# Patient Record
Sex: Male | Born: 1955
Health system: Southern US, Community
[De-identification: ages and names within clinical notes are randomized; demographics above are authoritative.]

## PROBLEM LIST (undated history)

## (undated) DIAGNOSIS — C801 Malignant (primary) neoplasm, unspecified: Secondary | ICD-10-CM

## (undated) DIAGNOSIS — M1990 Unspecified osteoarthritis, unspecified site: Secondary | ICD-10-CM

## (undated) DIAGNOSIS — S14105A Unspecified injury at C5 level of cervical spinal cord, initial encounter: Secondary | ICD-10-CM

## (undated) DIAGNOSIS — M419 Scoliosis, unspecified: Secondary | ICD-10-CM

## (undated) DIAGNOSIS — E785 Hyperlipidemia, unspecified: Secondary | ICD-10-CM

## (undated) DIAGNOSIS — F329 Major depressive disorder, single episode, unspecified: Secondary | ICD-10-CM

## (undated) DIAGNOSIS — E162 Hypoglycemia, unspecified: Secondary | ICD-10-CM

## (undated) DIAGNOSIS — F32A Depression, unspecified: Secondary | ICD-10-CM

## (undated) DIAGNOSIS — G709 Myoneural disorder, unspecified: Secondary | ICD-10-CM

## (undated) DIAGNOSIS — J40 Bronchitis, not specified as acute or chronic: Secondary | ICD-10-CM

## (undated) DIAGNOSIS — K219 Gastro-esophageal reflux disease without esophagitis: Secondary | ICD-10-CM

## (undated) DIAGNOSIS — E119 Type 2 diabetes mellitus without complications: Secondary | ICD-10-CM

## (undated) DIAGNOSIS — Z8719 Personal history of other diseases of the digestive system: Secondary | ICD-10-CM

## (undated) DIAGNOSIS — M542 Cervicalgia: Secondary | ICD-10-CM

## (undated) DIAGNOSIS — I10 Essential (primary) hypertension: Secondary | ICD-10-CM

## (undated) DIAGNOSIS — M25649 Stiffness of unspecified hand, not elsewhere classified: Secondary | ICD-10-CM

## (undated) HISTORY — PX: OTHER SURGICAL HISTORY: SHX169

## (undated) HISTORY — PX: UPPER GASTROINTESTINAL ENDOSCOPY: SHX188

## (undated) HISTORY — DX: Gastro-esophageal reflux disease without esophagitis: K21.9

## (undated) HISTORY — DX: Unspecified osteoarthritis, unspecified site: M19.90

## (undated) HISTORY — PX: TONSILLECTOMY: SUR1361

## (undated) HISTORY — DX: Hyperlipidemia, unspecified: E78.5

## (undated) HISTORY — DX: Essential (primary) hypertension: I10

## (undated) HISTORY — DX: Myoneural disorder, unspecified: G70.9

## (undated) HISTORY — PX: SPINE SURGERY: SHX786

## (undated) HISTORY — DX: Unspecified injury at C5 level of cervical spinal cord, initial encounter: S14.105A

## (undated) HISTORY — DX: Type 2 diabetes mellitus without complications: E11.9

---

## 1975-08-01 DIAGNOSIS — J40 Bronchitis, not specified as acute or chronic: Secondary | ICD-10-CM

## 1975-08-01 HISTORY — DX: Bronchitis, not specified as acute or chronic: J40

## 1997-07-31 HISTORY — PX: OTHER SURGICAL HISTORY: SHX169

## 1998-02-05 ENCOUNTER — Ambulatory Visit (HOSPITAL_COMMUNITY): Admission: RE | Admit: 1998-02-05 | Discharge: 1998-02-05 | Payer: Self-pay | Admitting: *Deleted

## 2000-02-06 ENCOUNTER — Encounter: Admission: RE | Admit: 2000-02-06 | Discharge: 2000-02-06 | Payer: Self-pay | Admitting: Dermatology

## 2000-02-06 ENCOUNTER — Encounter: Payer: Self-pay | Admitting: Dermatology

## 2001-02-15 ENCOUNTER — Encounter: Admission: RE | Admit: 2001-02-15 | Discharge: 2001-02-15 | Payer: Self-pay | Admitting: Dermatology

## 2001-02-15 ENCOUNTER — Encounter: Payer: Self-pay | Admitting: Dermatology

## 2002-01-02 ENCOUNTER — Encounter: Admission: RE | Admit: 2002-01-02 | Discharge: 2002-02-28 | Payer: Self-pay | Admitting: Family Medicine

## 2002-04-21 ENCOUNTER — Emergency Department (HOSPITAL_COMMUNITY): Admission: EM | Admit: 2002-04-21 | Discharge: 2002-04-21 | Payer: Self-pay | Admitting: *Deleted

## 2002-04-21 ENCOUNTER — Encounter: Payer: Self-pay | Admitting: *Deleted

## 2003-12-14 ENCOUNTER — Encounter: Payer: Self-pay | Admitting: Internal Medicine

## 2004-06-29 ENCOUNTER — Ambulatory Visit: Payer: Self-pay | Admitting: Family Medicine

## 2004-07-07 ENCOUNTER — Ambulatory Visit: Payer: Self-pay | Admitting: Family Medicine

## 2004-07-08 ENCOUNTER — Ambulatory Visit: Payer: Self-pay | Admitting: Family Medicine

## 2005-04-14 ENCOUNTER — Ambulatory Visit: Payer: Self-pay | Admitting: Family Medicine

## 2005-05-01 ENCOUNTER — Ambulatory Visit: Payer: Self-pay | Admitting: Family Medicine

## 2005-07-11 ENCOUNTER — Ambulatory Visit: Payer: Self-pay | Admitting: Family Medicine

## 2006-08-16 ENCOUNTER — Ambulatory Visit: Payer: Self-pay | Admitting: Family Medicine

## 2006-08-16 LAB — CONVERTED CEMR LAB
ALT: 38 units/L (ref 0–40)
AST: 25 units/L (ref 0–37)
Albumin: 4.6 g/dL (ref 3.5–5.2)
Alkaline Phosphatase: 71 units/L (ref 39–117)
BUN: 15 mg/dL (ref 6–23)
Basophils Absolute: 0.1 10*3/uL (ref 0.0–0.1)
Basophils Relative: 1 % (ref 0.0–1.0)
CO2: 28 meq/L (ref 19–32)
Calcium: 9.6 mg/dL (ref 8.4–10.5)
Chloride: 105 meq/L (ref 96–112)
Cholesterol: 109 mg/dL (ref 0–200)
Creatinine, Ser: 0.7 mg/dL (ref 0.4–1.5)
Eosinophils Relative: 3.1 % (ref 0.0–5.0)
GFR calc Af Amer: 152 mL/min
GFR calc non Af Amer: 126 mL/min
Glucose, Bld: 124 mg/dL — ABNORMAL HIGH (ref 70–99)
HCT: 46.8 % (ref 39.0–52.0)
HDL: 30.2 mg/dL — ABNORMAL LOW (ref >39.0)
Hemoglobin: 15.1 g/dL (ref 13.0–17.0)
Hgb A1c MFr Bld: 7.4 % — ABNORMAL HIGH (ref 4.6–6.0)
LDL Cholesterol: 53 mg/dL (ref 0–99)
Lymphocytes Relative: 28 % (ref 12.0–46.0)
MCHC: 32.3 g/dL (ref 30.0–36.0)
MCV: 89.4 fL (ref 78.0–100.0)
Monocytes Absolute: 0.6 10*3/uL (ref 0.2–0.7)
Monocytes Relative: 8 % (ref 3.0–11.0)
Neutro Abs: 4.5 10*3/uL (ref 1.4–7.7)
Neutrophils Relative %: 59.9 % (ref 43.0–77.0)
Platelets: 311 10*3/uL (ref 150–400)
Potassium: 3.4 meq/L — ABNORMAL LOW (ref 3.5–5.1)
RBC: 5.24 M/uL (ref 4.22–5.81)
RDW: 12.1 % (ref 11.5–14.6)
Sodium: 140 meq/L (ref 135–145)
TSH: 1.42 microintl units/mL (ref 0.35–5.50)
Total Bilirubin: 1.1 mg/dL (ref 0.3–1.2)
Total CHOL/HDL Ratio: 3.6
Total Protein: 7.7 g/dL (ref 6.0–8.3)
Triglycerides: 130 mg/dL (ref 0–149)
VLDL: 26 mg/dL (ref 0–40)
WBC: 7.5 10*3/uL (ref 4.5–10.5)

## 2007-02-22 ENCOUNTER — Ambulatory Visit: Payer: Self-pay | Admitting: Internal Medicine

## 2007-04-01 HISTORY — PX: OTHER SURGICAL HISTORY: SHX169

## 2007-04-04 ENCOUNTER — Encounter: Payer: Self-pay | Admitting: Family Medicine

## 2007-04-04 ENCOUNTER — Encounter: Payer: Self-pay | Admitting: Internal Medicine

## 2007-04-04 ENCOUNTER — Ambulatory Visit: Payer: Self-pay | Admitting: Internal Medicine

## 2007-04-04 DIAGNOSIS — K449 Diaphragmatic hernia without obstruction or gangrene: Secondary | ICD-10-CM | POA: Insufficient documentation

## 2007-04-04 DIAGNOSIS — K222 Esophageal obstruction: Secondary | ICD-10-CM | POA: Insufficient documentation

## 2007-09-16 ENCOUNTER — Ambulatory Visit: Payer: Self-pay | Admitting: Family Medicine

## 2007-09-16 DIAGNOSIS — I10 Essential (primary) hypertension: Secondary | ICD-10-CM | POA: Insufficient documentation

## 2007-09-16 DIAGNOSIS — K219 Gastro-esophageal reflux disease without esophagitis: Secondary | ICD-10-CM | POA: Insufficient documentation

## 2007-09-16 DIAGNOSIS — E782 Mixed hyperlipidemia: Secondary | ICD-10-CM | POA: Insufficient documentation

## 2007-09-16 DIAGNOSIS — E119 Type 2 diabetes mellitus without complications: Secondary | ICD-10-CM | POA: Insufficient documentation

## 2007-09-16 LAB — CONVERTED CEMR LAB: Blood Glucose, Fingerstick: 206

## 2007-09-18 LAB — CONVERTED CEMR LAB
ALT: 35 units/L (ref 0–53)
AST: 21 units/L (ref 0–37)
Albumin: 4.4 g/dL (ref 3.5–5.2)
Alkaline Phosphatase: 56 units/L (ref 39–117)
BUN: 12 mg/dL (ref 6–23)
Basophils Absolute: 0 10*3/uL (ref 0.0–0.1)
Basophils Relative: 0.5 % (ref 0.0–1.0)
Bilirubin, Direct: 0.2 mg/dL (ref 0.0–0.3)
CO2: 28 meq/L (ref 19–32)
Calcium: 10 mg/dL (ref 8.4–10.5)
Chloride: 102 meq/L (ref 96–112)
Cholesterol: 122 mg/dL (ref 0–200)
Creatinine, Ser: 0.8 mg/dL (ref 0.4–1.5)
Eosinophils Absolute: 0.2 10*3/uL (ref 0.0–0.6)
Eosinophils Relative: 2.2 % (ref 0.0–5.0)
GFR calc Af Amer: 130 mL/min
GFR calc non Af Amer: 107 mL/min
Glucose, Bld: 197 mg/dL — ABNORMAL HIGH (ref 70–99)
HCT: 46 % (ref 39.0–52.0)
HDL: 21.4 mg/dL — ABNORMAL LOW (ref >39.0)
Hemoglobin: 15.2 g/dL (ref 13.0–17.0)
Hgb A1c MFr Bld: 10.8 % — ABNORMAL HIGH (ref 4.6–6.0)
LDL Cholesterol: 76 mg/dL (ref 0–99)
Lymphocytes Relative: 21 % (ref 12.0–46.0)
MCHC: 33 g/dL (ref 30.0–36.0)
MCV: 90.1 fL (ref 78.0–100.0)
Monocytes Absolute: 0.6 10*3/uL (ref 0.2–0.7)
Monocytes Relative: 7.1 % (ref 3.0–11.0)
Neutro Abs: 6.4 10*3/uL (ref 1.4–7.7)
Neutrophils Relative %: 69.2 % (ref 43.0–77.0)
Platelets: 271 10*3/uL (ref 150–400)
Potassium: 3.9 meq/L (ref 3.5–5.1)
RBC: 5.11 M/uL (ref 4.22–5.81)
RDW: 11.9 % (ref 11.5–14.6)
Sodium: 138 meq/L (ref 135–145)
TSH: 1.35 microintl units/mL (ref 0.35–5.50)
Total Bilirubin: 1 mg/dL (ref 0.3–1.2)
Total CHOL/HDL Ratio: 5.7
Total Protein: 7.4 g/dL (ref 6.0–8.3)
Triglycerides: 125 mg/dL (ref 0–149)
VLDL: 25 mg/dL (ref 0–40)
WBC: 9.1 10*3/uL (ref 4.5–10.5)

## 2008-01-13 ENCOUNTER — Telehealth: Payer: Self-pay | Admitting: Family Medicine

## 2008-02-03 ENCOUNTER — Ambulatory Visit: Payer: Self-pay | Admitting: Family Medicine

## 2008-02-06 LAB — CONVERTED CEMR LAB
ALT: 29 units/L (ref 0–53)
AST: 22 units/L (ref 0–37)
Albumin: 4.2 g/dL (ref 3.5–5.2)
Alkaline Phosphatase: 77 units/L (ref 39–117)
Bilirubin, Direct: 0.1 mg/dL (ref 0.0–0.3)
Cholesterol: 105 mg/dL (ref 0–200)
HDL: 21.4 mg/dL — ABNORMAL LOW (ref >39.0)
Hgb A1c MFr Bld: 8.6 % — ABNORMAL HIGH (ref 4.6–6.0)
LDL Cholesterol: 67 mg/dL (ref 0–99)
Total Bilirubin: 0.8 mg/dL (ref 0.3–1.2)
Total CHOL/HDL Ratio: 4.9
Total Protein: 7.4 g/dL (ref 6.0–8.3)
Triglycerides: 83 mg/dL (ref 0–149)
VLDL: 17 mg/dL (ref 0–40)

## 2008-12-14 ENCOUNTER — Ambulatory Visit: Payer: Self-pay | Admitting: Family Medicine

## 2008-12-14 DIAGNOSIS — H6120 Impacted cerumen, unspecified ear: Secondary | ICD-10-CM | POA: Insufficient documentation

## 2008-12-15 LAB — CONVERTED CEMR LAB
ALT: 23 units/L (ref 0–53)
AST: 18 units/L (ref 0–37)
Albumin: 4.2 g/dL (ref 3.5–5.2)
Alkaline Phosphatase: 71 units/L (ref 39–117)
Bilirubin, Direct: 0.1 mg/dL (ref 0.0–0.3)
Cholesterol: 117 mg/dL (ref 0–200)
HDL: 27.7 mg/dL — ABNORMAL LOW (ref >39.00)
Hgb A1c MFr Bld: 11 % — ABNORMAL HIGH (ref 4.6–6.5)
LDL Cholesterol: 75 mg/dL (ref 0–99)
Total Bilirubin: 0.8 mg/dL (ref 0.3–1.2)
Total CHOL/HDL Ratio: 4
Total Protein: 7.3 g/dL (ref 6.0–8.3)
Triglycerides: 71 mg/dL (ref 0.0–149.0)
VLDL: 14.2 mg/dL (ref 0.0–40.0)

## 2008-12-16 ENCOUNTER — Ambulatory Visit: Payer: Self-pay | Admitting: Family Medicine

## 2008-12-17 ENCOUNTER — Encounter: Payer: Self-pay | Admitting: Family Medicine

## 2008-12-18 ENCOUNTER — Telehealth: Payer: Self-pay | Admitting: Family Medicine

## 2009-01-12 ENCOUNTER — Ambulatory Visit: Payer: Self-pay | Admitting: Family Medicine

## 2009-01-12 ENCOUNTER — Encounter: Admission: RE | Admit: 2009-01-12 | Discharge: 2009-01-12 | Payer: Self-pay | Admitting: Family Medicine

## 2009-01-13 ENCOUNTER — Encounter: Payer: Self-pay | Admitting: Family Medicine

## 2009-01-14 LAB — CONVERTED CEMR LAB
BUN: 16 mg/dL (ref 6–23)
CO2: 24 meq/L (ref 19–32)
Calcium: 9.3 mg/dL (ref 8.4–10.5)
Chloride: 107 meq/L (ref 96–112)
Creatinine, Ser: 0.7 mg/dL (ref 0.4–1.5)
Creatinine,U: 190.8 mg/dL
GFR calc non Af Amer: 124.3 mL/min (ref >60)
Glucose, Bld: 98 mg/dL (ref 70–99)
Hgb A1c MFr Bld: 8.8 % — ABNORMAL HIGH (ref 4.6–6.5)
Microalb Creat Ratio: 79.7 mg/g — ABNORMAL HIGH (ref 0.0–30.0)
Microalb, Ur: 15.2 mg/dL — ABNORMAL HIGH (ref 0.0–1.9)
Potassium: 3.4 meq/L — ABNORMAL LOW (ref 3.5–5.1)
Sodium: 140 meq/L (ref 135–145)

## 2009-01-19 ENCOUNTER — Telehealth: Payer: Self-pay | Admitting: Family Medicine

## 2009-08-18 ENCOUNTER — Ambulatory Visit: Payer: Self-pay | Admitting: Family Medicine

## 2009-08-18 LAB — CONVERTED CEMR LAB: Blood Glucose, AC Bkfst: 147 mg/dL

## 2009-08-20 LAB — CONVERTED CEMR LAB
ALT: 41 units/L (ref 0–53)
AST: 32 units/L (ref 0–37)
Albumin: 4.4 g/dL (ref 3.5–5.2)
Alkaline Phosphatase: 68 units/L (ref 39–117)
BUN: 19 mg/dL (ref 6–23)
Basophils Absolute: 0.1 10*3/uL (ref 0.0–0.1)
Basophils Relative: 1.2 % (ref 0.0–3.0)
Bilirubin, Direct: 0.1 mg/dL (ref 0.0–0.3)
CO2: 27 meq/L (ref 19–32)
Calcium: 9.5 mg/dL (ref 8.4–10.5)
Chloride: 109 meq/L (ref 96–112)
Cholesterol: 139 mg/dL (ref 0–200)
Creatinine, Ser: 0.7 mg/dL (ref 0.4–1.5)
Creatinine,U: 151.5 mg/dL
Eosinophils Absolute: 0.4 10*3/uL (ref 0.0–0.7)
Eosinophils Relative: 5.7 % — ABNORMAL HIGH (ref 0.0–5.0)
GFR calc non Af Amer: 124.03 mL/min (ref >60)
Glucose, Bld: 140 mg/dL — ABNORMAL HIGH (ref 70–99)
HCT: 46 % (ref 39.0–52.0)
HDL: 28.8 mg/dL — ABNORMAL LOW (ref >39.00)
Hemoglobin: 15 g/dL (ref 13.0–17.0)
Hgb A1c MFr Bld: 6.9 % — ABNORMAL HIGH (ref 4.6–6.5)
LDL Cholesterol: 83 mg/dL (ref 0–99)
Lymphocytes Relative: 25 % (ref 12.0–46.0)
Lymphs Abs: 1.9 10*3/uL (ref 0.7–4.0)
MCHC: 32.5 g/dL (ref 30.0–36.0)
MCV: 93.2 fL (ref 78.0–100.0)
Microalb Creat Ratio: 78.5 mg/g — ABNORMAL HIGH (ref 0.0–30.0)
Microalb, Ur: 11.9 mg/dL — ABNORMAL HIGH (ref 0.0–1.9)
Monocytes Absolute: 0.6 10*3/uL (ref 0.1–1.0)
Monocytes Relative: 7.6 % (ref 3.0–12.0)
Neutro Abs: 4.4 10*3/uL (ref 1.4–7.7)
Neutrophils Relative %: 60.5 % (ref 43.0–77.0)
Platelets: 264 10*3/uL (ref 150.0–400.0)
Potassium: 4.2 meq/L (ref 3.5–5.1)
RBC: 4.94 M/uL (ref 4.22–5.81)
RDW: 12.1 % (ref 11.5–14.6)
Sodium: 143 meq/L (ref 135–145)
TSH: 1.53 microintl units/mL (ref 0.35–5.50)
Total Bilirubin: 0.9 mg/dL (ref 0.3–1.2)
Total CHOL/HDL Ratio: 5
Total Protein: 7.6 g/dL (ref 6.0–8.3)
Triglycerides: 138 mg/dL (ref 0.0–149.0)
VLDL: 27.6 mg/dL (ref 0.0–40.0)
WBC: 7.4 10*3/uL (ref 4.5–10.5)

## 2009-11-05 ENCOUNTER — Ambulatory Visit: Payer: Self-pay | Admitting: Internal Medicine

## 2010-02-02 ENCOUNTER — Encounter: Payer: Self-pay | Admitting: Internal Medicine

## 2010-02-04 ENCOUNTER — Encounter: Payer: Self-pay | Admitting: Internal Medicine

## 2010-02-23 ENCOUNTER — Encounter: Payer: Self-pay | Admitting: Family Medicine

## 2010-03-31 ENCOUNTER — Emergency Department (HOSPITAL_COMMUNITY): Admission: EM | Admit: 2010-03-31 | Discharge: 2010-03-31 | Payer: Self-pay | Admitting: Emergency Medicine

## 2010-08-23 ENCOUNTER — Encounter: Payer: Self-pay | Admitting: Family Medicine

## 2010-08-23 ENCOUNTER — Ambulatory Visit
Admission: RE | Admit: 2010-08-23 | Discharge: 2010-08-23 | Payer: Self-pay | Source: Home / Self Care | Attending: Family Medicine | Admitting: Family Medicine

## 2010-08-23 ENCOUNTER — Other Ambulatory Visit: Payer: Self-pay | Admitting: Family Medicine

## 2010-08-23 LAB — HEMOGLOBIN A1C: Hgb A1c MFr Bld: 7.5 % — ABNORMAL HIGH (ref 4.6–6.5)

## 2010-08-23 LAB — LIPID PANEL
Cholesterol: 127 mg/dL (ref 0–200)
HDL: 28.9 mg/dL — ABNORMAL LOW
LDL Cholesterol: 75 mg/dL (ref 0–99)
Total CHOL/HDL Ratio: 4
Triglycerides: 114 mg/dL (ref 0.0–149.0)
VLDL: 22.8 mg/dL (ref 0.0–40.0)

## 2010-08-23 LAB — BASIC METABOLIC PANEL
BUN: 21 mg/dL (ref 6–23)
CO2: 29 mEq/L (ref 19–32)
Calcium: 10.2 mg/dL (ref 8.4–10.5)
Chloride: 104 mEq/L (ref 96–112)
Creatinine, Ser: 0.8 mg/dL (ref 0.4–1.5)
GFR: 112.39 mL/min (ref >60.00)
Glucose, Bld: 119 mg/dL — ABNORMAL HIGH (ref 70–99)
Potassium: 5 mEq/L (ref 3.5–5.1)
Sodium: 142 mEq/L (ref 135–145)

## 2010-08-23 LAB — PSA: PSA: 0.75 ng/mL (ref 0.10–4.00)

## 2010-08-23 LAB — CBC WITH DIFFERENTIAL/PLATELET
Basophils Absolute: 0.1 10*3/uL (ref 0.0–0.1)
Basophils Relative: 0.9 % (ref 0.0–3.0)
Eosinophils Absolute: 0.3 10*3/uL (ref 0.0–0.7)
Eosinophils Relative: 3 % (ref 0.0–5.0)
HCT: 46.8 % (ref 39.0–52.0)
Hemoglobin: 16.1 g/dL (ref 13.0–17.0)
Lymphocytes Relative: 22.5 % (ref 12.0–46.0)
Lymphs Abs: 2 10*3/uL (ref 0.7–4.0)
MCHC: 34.5 g/dL (ref 30.0–36.0)
MCV: 92.2 fl (ref 78.0–100.0)
Monocytes Absolute: 0.8 10*3/uL (ref 0.1–1.0)
Monocytes Relative: 9 % (ref 3.0–12.0)
Neutro Abs: 5.8 10*3/uL (ref 1.4–7.7)
Neutrophils Relative %: 64.6 % (ref 43.0–77.0)
Platelets: 285 10*3/uL (ref 150.0–400.0)
RBC: 5.07 Mil/uL (ref 4.22–5.81)
RDW: 12.8 % (ref 11.5–14.6)
WBC: 9 10*3/uL (ref 4.5–10.5)

## 2010-08-23 LAB — HEPATIC FUNCTION PANEL
ALT: 53 U/L (ref 0–53)
AST: 31 U/L (ref 0–37)
Albumin: 4.8 g/dL (ref 3.5–5.2)
Alkaline Phosphatase: 80 U/L (ref 39–117)
Bilirubin, Direct: 0.1 mg/dL (ref 0.0–0.3)
Total Bilirubin: 0.8 mg/dL (ref 0.3–1.2)
Total Protein: 8.1 g/dL (ref 6.0–8.3)

## 2010-08-23 LAB — TSH: TSH: 1.76 u[IU]/mL (ref 0.35–5.50)

## 2010-08-30 NOTE — Medication Information (Signed)
Summary: Approved/[facility]  Approved/[facility]   Imported By: Lester Salisbury 02/08/2010 08:36:37  _____________________________________________________________________  External Attachment:    Type:   Image     Comment:   External Document

## 2010-08-30 NOTE — Assessment & Plan Note (Signed)
Summary: FUP ON DM//CCM   Vital Signs:  Patient profile:   55 year old male Weight:      222 pounds Temp:     97.9 degrees F oral Pulse rate:   75 / minute BP sitting:   154 / 78  (left arm) Cuff size:   large  Vitals Entered By: Alfred Levins, CMA (January 12, 2009 2:24 PM) CC: dm check up, bs was 107   Allergies: No Known Drug Allergies   Impression & Recommendations:  Problem # 1:  HYPERLIPIDEMIA (ICD-272.4)  His updated medication list for this problem includes:    Simvastatin 40 Mg Tabs (Simvastatin) .Marland Kitchen... 1 by mouth once daily  Problem # 2:  HYPERTENSION (ICD-401.9)  His updated medication list for this problem includes:    Lotrel 5-20 Mg Caps (Amlodipine besy-benazepril hcl) .Marland Kitchen... 1 by mouth once daily  Problem # 3:  DIABETES MELLITUS, TYPE II (ICD-250.00)  His updated medication list for this problem includes:    Lotrel 5-20 Mg Caps (Amlodipine besy-benazepril hcl) .Marland Kitchen... 1 by mouth once daily    Aspirin 81 Mg Tbec (Aspirin) ..... One by mouth every day    Januvia 100 Mg Tabs (Sitagliptin phosphate) .Marland Kitchen... Take 1 tab by mouth daily    Glipizide-metformin Hcl 5-500 Mg Tabs (Glipizide-metformin hcl) .Marland Kitchen..Marland Kitchen Two times a day  Orders: Venipuncture ([address]) TLB-BMP (Basic Metabolic Panel-BMET) (80048-METABOL) TLB-A1C / Hgb A1C (Glycohemoglobin) (83036-A1C) TLB-Microalbumin/Creat Ratio, Urine (82043-MALB)  Complete Medication List: 1)  Lotrel 5-20 Mg Caps (Amlodipine besy-benazepril hcl) .Marland Kitchen.. 1 by mouth once daily 2)  Simvastatin 40 Mg Tabs (Simvastatin) .Marland Kitchen.. 1 by mouth once daily 3)  Aspirin 81 Mg Tbec (Aspirin) .... One by mouth every day 4)  Januvia 100 Mg Tabs (Sitagliptin phosphate) .... Take 1 tab by mouth daily 5)  Glipizide-metformin Hcl 5-500 Mg Tabs (Glipizide-metformin hcl) .... Two times a day  Patient Instructions: 1)  get labs today. Prescriptions: GLIPIZIDE-METFORMIN HCL 5-500 MG TABS (GLIPIZIDE-METFORMIN HCL) two times a day  #60 x 11   Entered and  Authorized by:   Nelwyn Salisbury MD   Signed by:   Nelwyn Salisbury MD on 01/12/2009   Method used:   Electronically to        [facility]  [phone]* (retail)       [address]       [place], [place]  [address]       Ph: [phone] or [phone]       Fax: [phone]   RxID:   [id number] JANUVIA 100 MG TABS (SITAGLIPTIN PHOSPHATE) Take 1 tab by mouth daily  #90 x 3   Entered and Authorized by:   Nelwyn Salisbury MD   Signed by:   Nelwyn Salisbury MD on 01/12/2009   Method used:   Print then Give to Patient   RxID:   [id number] GLIPIZIDE-METFORMIN HCL 5-500 MG TABS (GLIPIZIDE-METFORMIN HCL) two times a day  #180 x 3   Entered and Authorized by:   Nelwyn Salisbury MD   Signed by:   Nelwyn Salisbury MD on 01/12/2009   Method used:   Print then Give to Patient   RxID:   [id number]

## 2010-08-30 NOTE — Progress Notes (Signed)
Summary: refill simvastatin  Phone Note Call from Patient Call back at [facility] Phone [phone] Call back at [phone]   Caller: Patient-LIVE CALL Call For: DR Kiante Petrovich Reason for Call: Refill Medication Summary of Call: RF [facility] . CALL [facility] [address]. NEXT OV WITH DR Clent Ridges IS ON 02/03/08. Initial call taken by: Warnell Forester,  January 13, 2008 1:51 PM      Prescriptions: SIMVASTATIN 40 MG TABS (SIMVASTATIN) 1 by mouth once daily  #90 x 3   Entered by:   Sindy Guadeloupe RN   Authorized by:   Nelwyn Salisbury MD   Signed by:   Sindy Guadeloupe RN on 01/13/2008   Method used:   Electronically sent to ...       [facility]  [address]  [phone]*       [address]       [place], [place]  [address]       Ph: [phone] or [phone]       Fax: [phone]   RxID:   [id number]

## 2010-08-30 NOTE — Letter (Signed)
Summary: [facility]  [facility]   Imported By: Maryln Gottron 01/19/2009 13:49:16  _____________________________________________________________________  External Attachment:    Type:   Image     Comment:   External Document

## 2010-08-30 NOTE — Procedures (Signed)
Summary: Gastroenterology colon  Gastroenterology colon   Imported By: Donneta Romberg 11/13/2007 14:01:11  _____________________________________________________________________  External Attachment:    Type:   Image     Comment:   External Document

## 2010-08-30 NOTE — Assessment & Plan Note (Signed)
Summary: med check/mhf   Vital Signs:  Patient profile:   55 year old male Height:      71 inches Weight:      224 pounds BMI:     31.35 Temp:     98.0 degrees F oral BP sitting:   142 / 92  (left arm) Cuff size:   regular  Vitals Entered By: Kern Reap CMA (Dec 14, 2008 8:46 AM)  Reason for Visit med check  History of Present Illness: Here to recheck meds. He has been out of Lotrel for a week. Feels good except he complains of decreased hearing in the right ear for several weeks. No ringing or pain. he is fasting.    Allergies (verified): No Known Drug Allergies  Past History:  Past Medical History:    Reviewed history from 09/16/2007 and no changes required:    Diabetes mellitus, type II    Hypertension    GERD    Hyperlipidemia  Review of Systems  The patient denies anorexia, fever, weight loss, weight gain, vision loss, hoarseness, chest pain, syncope, dyspnea on exertion, peripheral edema, prolonged cough, headaches, hemoptysis, abdominal pain, melena, hematochezia, severe indigestion/heartburn, hematuria, incontinence, genital sores, muscle weakness, suspicious skin lesions, transient blindness, difficulty walking, depression, unusual weight change, abnormal bleeding, enlarged lymph nodes, angioedema, breast masses, and testicular masses.    Physical Exam  General:  Well-developed,well-nourished,in no acute distress; alert,appropriate and cooperative throughout examination Ears:  both are full of cerumen Neck:  No deformities, masses, or tenderness noted. Lungs:  Normal respiratory effort, chest expands symmetrically. Lungs are clear to auscultation, no crackles or wheezes. Heart:  Normal rate and regular rhythm. S1 and S2 normal without gallop, murmur, click, rub or other extra sounds.  Diabetes Management Exam:    Eye Exam:       Eye Exam done elsewhere          Date: 12/17/2008          Done by: Ginette Otto opthamology   Impression &  Recommendations:  Problem # 1:  HYPERLIPIDEMIA (ICD-272.4)  His updated medication list for this problem includes:    Simvastatin 40 Mg Tabs (Simvastatin) .Marland Kitchen... 1 by mouth once daily  Orders: Venipuncture ([address]) TLB-Lipid Panel (80061-LIPID) TLB-Hepatic/Liver Function Pnl (80076-HEPATIC)  Problem # 2:  HYPERTENSION (ICD-401.9)  His updated medication list for this problem includes:    Lotrel 5-20 Mg Caps (Amlodipine besy-benazepril hcl) .Marland Kitchen... 1 by mouth once daily  Problem # 3:  DIABETES MELLITUS, TYPE II (ICD-250.00)  The following medications were removed from the medication list:    Glyburide-metformin 2.5-500 Mg Tabs (Glyburide-metformin) .Marland Kitchen... 2 by mouth two times a day His updated medication list for this problem includes:    Lotrel 5-20 Mg Caps (Amlodipine besy-benazepril hcl) .Marland Kitchen... 1 by mouth once daily    Aspirin 81 Mg Tbec (Aspirin) ..... One by mouth every day    Januvia 100 Mg Tabs (Sitagliptin phosphate) .Marland Kitchen... Take 1 tab by mouth daily  Orders: TLB-A1C / Hgb A1C (Glycohemoglobin) (83036-A1C)  Problem # 4:  CERUMEN IMPACTION (ICD-380.4)  Complete Medication List: 1)  Lotrel 5-20 Mg Caps (Amlodipine besy-benazepril hcl) .Marland Kitchen.. 1 by mouth once daily 2)  Simvastatin 40 Mg Tabs (Simvastatin) .Marland Kitchen.. 1 by mouth once daily 3)  Aspirin 81 Mg Tbec (Aspirin) .... One by mouth every day 4)  Januvia 100 Mg Tabs (Sitagliptin phosphate) .... Take 1 tab by mouth daily  Patient Instructions: 1)  Get labs. Both ears were irrigated clear.

## 2010-08-30 NOTE — Miscellaneous (Signed)
Summary: Prilosec Rx  [facility] requests prior authorization for Prilosec. I have sent in omeprazole as I suspect they are trying to give patient name brand prescription which will be more expensive. Dottie Nelson-Smith CMA Duncan Dull)  February 02, 2010 4:43 PM   Clinical Lists Changes  Medications: Changed medication from PRILOSEC 20 MG CPDR (OMEPRAZOLE) one tablet by mouth once daily to OMEPRAZOLE 20 MG CPDR (OMEPRAZOLE) Take 1 tablet by mouth once a day. PHARMACY-PLEASE D/C RX FOR NAME BRAND PRILOSEC ~! - Signed Rx of OMEPRAZOLE 20 MG CPDR (OMEPRAZOLE) Take 1 tablet by mouth once a day. PHARMACY-PLEASE D/C RX FOR NAME BRAND PRILOSEC ~!;  #90 x 2;  Signed;  Entered by: Lamona Curl CMA (AAMA);  Authorized by: Hart Carwin MD;  Method used: [facility] [address], [place], [place]  [address], Ph: [phone], Fax: [phone]    Prescriptions: OMEPRAZOLE 20 MG CPDR (OMEPRAZOLE) Take 1 tablet by mouth once a day. PHARMACY-PLEASE D/C RX FOR NAME BRAND PRILOSEC ~!  #90 x 2   Entered by:   Lamona Curl CMA (AAMA)   Authorized by:   Hart Carwin MD   Signed by:   Lamona Curl CMA (AAMA) on 02/02/2010   Method used:   Faxed to ...       [facility] ([facility]order)       [address] [place], [place]  [address]       Ph: [phone]       Fax: [phone]   RxID:   [id number]

## 2010-08-30 NOTE — Assessment & Plan Note (Signed)
Summary: discuss labs/cdw   Vital Signs:  Patient profile:   55 year old male Weight:      223 pounds Temp:     98.0 degrees F oral BP sitting:   158 / 92  (left arm) Cuff size:   large  Vitals Entered By: Alfred Levins, CMA (Dec 16, 2008 1:22 PM) CC: discuss labs   History of Present Illness: Here to discuss his DM. We recently saw him and drew some labs. His A1c is up to 11.0, and this is the highest it has ever been. I asked him to come in to discuss possibly switching to insulin, but he is very resistant to this idea today. he asks if there is anything else we could do. As we spoke today, it became apparent that he has only been taking Januvia for the past few months and not glipzide-metformin. I had intended to add Januvia to the glipizide-metformin, but he thought we wanted him to switch. he also admits to a poor diet, and we began to talk about calories, sugar content of foods, etc. He obviously lacks a good understanding of this.   Allergies: No Known Drug Allergies  Past History:  Past Medical History:    Reviewed history from 09/16/2007 and no changes required:    Diabetes mellitus, type II    Hypertension    GERD    Hyperlipidemia  Review of Systems  The patient denies anorexia, fever, weight loss, weight gain, vision loss, decreased hearing, hoarseness, chest pain, syncope, dyspnea on exertion, peripheral edema, prolonged cough, headaches, hemoptysis, abdominal pain, melena, hematochezia, severe indigestion/heartburn, hematuria, incontinence, genital sores, muscle weakness, suspicious skin lesions, transient blindness, difficulty walking, depression, unusual weight change, abnormal bleeding, enlarged lymph nodes, angioedema, breast masses, and testicular masses.    Physical Exam  General:  overweight-appearing.     Impression & Recommendations:  Problem # 1:  HYPERTENSION (ICD-401.9)  His updated medication list for this problem includes:    Lotrel 5-20 Mg Caps  (Amlodipine besy-benazepril hcl) .Marland Kitchen... 1 by mouth once daily  Problem # 2:  DIABETES MELLITUS, TYPE II (ICD-250.00)  His updated medication list for this problem includes:    Lotrel 5-20 Mg Caps (Amlodipine besy-benazepril hcl) .Marland Kitchen... 1 by mouth once daily    Aspirin 81 Mg Tbec (Aspirin) ..... One by mouth every day    Januvia 100 Mg Tabs (Sitagliptin phosphate) .Marland Kitchen... Take 1 tab by mouth daily    Glipizide-metformin Hcl 5-500 Mg Tabs (Glipizide-metformin hcl) .Marland Kitchen..Marland Kitchen Two times a day  Problem # 3:  HYPERLIPIDEMIA (ICD-272.4)  His updated medication list for this problem includes:    Simvastatin 40 Mg Tabs (Simvastatin) .Marland Kitchen... 1 by mouth once daily  Complete Medication List: 1)  Lotrel 5-20 Mg Caps (Amlodipine besy-benazepril hcl) .Marland Kitchen.. 1 by mouth once daily 2)  Simvastatin 40 Mg Tabs (Simvastatin) .Marland Kitchen.. 1 by mouth once daily 3)  Aspirin 81 Mg Tbec (Aspirin) .... One by mouth every day 4)  Januvia 100 Mg Tabs (Sitagliptin phosphate) .... Take 1 tab by mouth daily 5)  Glipizide-metformin Hcl 5-500 Mg Tabs (Glipizide-metformin hcl) .... Two times a day  Patient Instructions: 1)  we agreed to not use insulin, but to maximize oral meds and diet. He will take the meds above together, and we will send him to Nutrition for detailed dietary education.  2)  Please schedule a follow-up appointment in 1 month.  Prescriptions: GLIPIZIDE-METFORMIN HCL 5-500 MG TABS (GLIPIZIDE-METFORMIN HCL) two times a day  #60 x 11  Entered and Authorized by:   Nelwyn Salisbury MD   Signed by:   Nelwyn Salisbury MD on 12/16/2008   Method used:   Electronically to        [facility]  [phone]* (retail)       [address]       [place], [place]  [address]       Ph: [phone] or [phone]       Fax: [phone]   RxID:   [id number]

## 2010-08-30 NOTE — Assessment & Plan Note (Signed)
Summary: reck diabetes/jls  Medications Added GLYBURIDE-METFORMIN 2.5-500 MG TABS (GLYBURIDE-METFORMIN) 1 by mouth two times a day LOTREL 5-20 MG CAPS (AMLODIPINE BESY-BENAZEPRIL HCL) 1 by mouth once daily SIMVASTATIN 40 MG TABS (SIMVASTATIN) 1 by mouth once daily ASPIRIN 81 MG  TBEC (ASPIRIN) one by mouth every day      Allergies Added: NKDA  Vital Signs:  Patient Profile:   55 Years Old Male Weight:      244 pounds Temp:     98.4 degrees F oral Pulse rate:   84 / minute Pulse rhythm:   regular BP sitting:   102 / 74  (left arm) Cuff size:   large  Vitals Entered By: Alfred Levins, CMA (September 16, 2007 11:12 AM)             CBG Result 206     Chief Complaint:  Diabetes Management.  History of Present Illness: Here to recheck DM and HTN and lipids. Fasting for labs. Feels good. Had a DOT exam 2 weeks ago. They did a random glucose and it was "in the 200's". He admits to cheating on his diet at times.  GERD is well controlled on Prilosec OTC. He wants to know if this is OK to take.    Current Allergies: No known allergies   Past Medical History:    Reviewed history and no changes required:       Diabetes mellitus, type II       Hypertension       GERD       Hyperlipidemia  Past Surgical History:    Reviewed history and no changes required:       Colonoscopy 2005       Lymph nodes biopsy        Melanoma rt calf 1999       EGD with esophageal dilation  9-08 per Dr. Juanda Chance   Family History:    Unremarkable  Social History:    Married    Never Smoked    Alcohol use-no    Drug use-no   Risk Factors:  Tobacco use:  never Drug use:  no Alcohol use:  no    Physical Exam  General:     Well-developed,well-nourished,in no acute distress; alert,appropriate and cooperative throughout examination Neck:     No deformities, masses, or tenderness noted. Lungs:     Normal respiratory effort, chest expands symmetrically. Lungs are clear to auscultation,  no crackles or wheezes. Heart:     Normal rate and regular rhythm. S1 and S2 normal without gallop, murmur, click, rub or other extra sounds.    Impression & Recommendations:  Problem # 1:  DIABETES MELLITUS, TYPE II (ICD-250.00)  His updated medication list for this problem includes:    Glyburide-metformin 2.5-500 Mg Tabs (Glyburide-metformin) .Marland Kitchen... 1 by mouth two times a day    Lotrel 5-20 Mg Caps (Amlodipine besy-benazepril hcl) .Marland Kitchen... 1 by mouth once daily    Aspirin 81 Mg Tbec (Aspirin) ..... One by mouth every day  Orders: Venipuncture ([address]) TLB-Lipid Panel (80061-LIPID) TLB-BMP (Basic Metabolic Panel-BMET) (80048-METABOL) TLB-CBC Platelet - w/Differential (85025-CBCD) TLB-Hepatic/Liver Function Pnl (80076-HEPATIC) TLB-TSH (Thyroid Stimulating Hormone) (84443-TSH) TLB-A1C / Hgb A1C (Glycohemoglobin) (83036-A1C)   Problem # 2:  HYPERTENSION (ICD-401.9)  His updated medication list for this problem includes:    Lotrel 5-20 Mg Caps (Amlodipine besy-benazepril hcl) .Marland Kitchen... 1 by mouth once daily   Problem # 3:  GERD (ICD-530.81)  Problem # 4:  HYPERLIPIDEMIA (ICD-272.4)  His updated medication  list for this problem includes:    Simvastatin 40 Mg Tabs (Simvastatin) .Marland Kitchen... 1 by mouth once daily   Complete Medication List: 1)  Glyburide-metformin 2.5-500 Mg Tabs (Glyburide-metformin) .Marland Kitchen.. 1 by mouth two times a day 2)  Lotrel 5-20 Mg Caps (Amlodipine besy-benazepril hcl) .Marland Kitchen.. 1 by mouth once daily 3)  Simvastatin 40 Mg Tabs (Simvastatin) .Marland Kitchen.. 1 by mouth once daily 4)  Aspirin 81 Mg Tbec (Aspirin) .... One by mouth every day   Patient Instructions: 1)  Get labs.  2)  It is important that you exercise regularly at least 20 minutes 5 times a week. If you develop chest pain, have severe difficulty breathing, or feel very tired , stop exercising immediately and seek medical attention. 3)  You need to lose weight. Consider a lower calorie diet and regular exercise.      Prescriptions: SIMVASTATIN 40 MG TABS (SIMVASTATIN) 1 by mouth once daily  #90 x 3   Entered and Authorized by:   Nelwyn Salisbury MD   Signed by:   Nelwyn Salisbury MD on 09/16/2007   Method used:   Print then Give to Patient   RxID:   [id number] LOTREL 5-20 MG CAPS (AMLODIPINE BESY-BENAZEPRIL HCL) 1 by mouth once daily  #90 x 3   Entered and Authorized by:   Nelwyn Salisbury MD   Signed by:   Nelwyn Salisbury MD on 09/16/2007   Method used:   Print then Give to Patient   RxID:   [id number] GLYBURIDE-METFORMIN 2.5-500 MG TABS (GLYBURIDE-METFORMIN) 1 by mouth two times a day  #180 x 3   Entered and Authorized by:   Nelwyn Salisbury MD   Signed by:   Nelwyn Salisbury MD on 09/16/2007   Method used:   Print then Give to Patient   RxID:   [id number]  ] Laboratory Results   Blood Tests   Date/Time Recieved: September 16, 2007 11:15 AM  CBG Random: 206  Comments: ..................................................................Marland KitchenAlfred Levins, CMA  September 16, 2007 11:15 AM

## 2010-08-30 NOTE — Letter (Signed)
Summary: Diabetic Eye Evaluation/[facility]  Diabetic Eye Evaluation/[facility]   Imported By: Maryln Gottron 12/22/2008 14:29:52  _____________________________________________________________________  External Attachment:    Type:   Image     Comment:   External Document

## 2010-08-30 NOTE — Procedures (Signed)
Summary: egd report  egd report   Imported By: Kassie Mends 04/15/2007 09:44:13  _____________________________________________________________________  External Attachment:    Type:   Image     Comment:   egd report

## 2010-08-30 NOTE — Progress Notes (Signed)
Summary: pt needs Free Style Lite Test Strips  Phone Note From Other Clinic   Caller: [employer] Call For: Gershon Crane Summary of Call: [facility] [employer] [facility] called and said that the pt is in need of testing supplies. Free Style Lite Test Strips is what the pt uses. Please call script into [facility] on [address]. [phone] Initial call taken by: Lucy Antigua,  January 19, 2009 1:19 PM  Follow-up for Phone Call        Phone call completed Follow-up by: Alfred Levins, CMA,  January 19, 2009 4:59 PM    New/Updated Medications: FREESTYLE LITE TEST  STRP (GLUCOSE BLOOD) test once daily   Prescriptions: FREESTYLE LITE TEST  STRP (GLUCOSE BLOOD) test once daily  #100 x 5   Entered by:   Alfred Levins, CMA   Authorized by:   Nelwyn Salisbury MD   Signed by:   Alfred Levins, CMA on 01/19/2009   Method used:   Electronically to        [facility]  [phone]* (retail)       [address]       [place], [place]  [address]       Ph: [phone] or [phone]       Fax: [phone]   RxID:   [id number]

## 2010-08-30 NOTE — Letter (Signed)
Summary: Diabetic Eye Evaluation/[facility]  Diabetic Eye Evaluation/[facility]   Imported By: Maryln Gottron 02/28/2010 11:26:20  _____________________________________________________________________  External Attachment:    Type:   Image     Comment:   External Document

## 2010-08-30 NOTE — Progress Notes (Signed)
Summary: Phone note  Phone Note Call from Patient   Summary of Call: Patient requesting to be set up with a nutrionist. Initial call taken by: Darra Lis RMA,  Dec 18, 2008 3:28 PM  Follow-up for Phone Call        Phone Call Completed Follow-up by: Alfred Levins, CMA,  Dec 18, 2008 3:50 PM

## 2010-08-30 NOTE — Assessment & Plan Note (Signed)
Summary: RENEW CHOLESTERAL MED/[facility] rsc per wife/njr   Vital Signs:  Patient Profile:   55 Years Old Male Weight:      245 pounds Temp:     98.0 degrees F oral Pulse rate:   72 / minute Pulse rhythm:   regular BP sitting:   142 / 84  (left arm) Cuff size:   large  Vitals Entered By: Alfred Levins, CMA (February 03, 2008 8:42 AM)                 Chief Complaint:  discuss meds.  History of Present Illness: Here to recheck BP and labs. Is fasting. Feels good.     Current Allergies: No known allergies   Past Medical History:    Reviewed history from 09/16/2007 and no changes required:       Diabetes mellitus, type II       Hypertension       GERD       Hyperlipidemia     Review of Systems  The patient denies anorexia, fever, weight loss, weight gain, vision loss, decreased hearing, hoarseness, chest pain, syncope, dyspnea on exertion, peripheral edema, prolonged cough, headaches, hemoptysis, abdominal pain, melena, hematochezia, severe indigestion/heartburn, hematuria, incontinence, genital sores, muscle weakness, suspicious skin lesions, transient blindness, difficulty walking, depression, unusual weight change, abnormal bleeding, enlarged lymph nodes, angioedema, breast masses, and testicular masses.     Physical Exam  General:     overweight-appearing.      Impression & Recommendations:  Problem # 1:  HYPERLIPIDEMIA (ICD-272.4)  His updated medication list for this problem includes:    Simvastatin 40 Mg Tabs (Simvastatin) .Marland Kitchen... 1 by mouth once daily  Orders: Venipuncture ([address]) TLB-Lipid Panel (80061-LIPID) TLB-Hepatic/Liver Function Pnl (80076-HEPATIC)   Problem # 2:  HYPERTENSION (ICD-401.9)  His updated medication list for this problem includes:    Lotrel 5-20 Mg Caps (Amlodipine besy-benazepril hcl) .Marland Kitchen... 1 by mouth once daily   Problem # 3:  DIABETES MELLITUS, TYPE II (ICD-250.00)  His updated medication list for this problem includes:  Glyburide-metformin 2.5-500 Mg Tabs (Glyburide-metformin) .Marland Kitchen... 2 by mouth two times a day    Lotrel 5-20 Mg Caps (Amlodipine besy-benazepril hcl) .Marland Kitchen... 1 by mouth once daily    Aspirin 81 Mg Tbec (Aspirin) ..... One by mouth every day  Orders: TLB-A1C / Hgb A1C (Glycohemoglobin) (83036-A1C)   Complete Medication List: 1)  Glyburide-metformin 2.5-500 Mg Tabs (Glyburide-metformin) .... 2 by mouth two times a day 2)  Lotrel 5-20 Mg Caps (Amlodipine besy-benazepril hcl) .Marland Kitchen.. 1 by mouth once daily 3)  Simvastatin 40 Mg Tabs (Simvastatin) .Marland Kitchen.. 1 by mouth once daily 4)  Aspirin 81 Mg Tbec (Aspirin) .... One by mouth every day   Patient Instructions: 1)  Check labs.   ]

## 2010-08-30 NOTE — Assessment & Plan Note (Signed)
Summary: FOOD IS GETTING STUCK IN THROAT/YF    History of Present Illness Visit Type: Follow-up Visit Primary GI MD: Lina Sar MD Primary Provider: Gershon Crane, MD Chief Complaint: dysphagia x 6-8 months sparatically History of Present Illness:   55 year old white male with 6-8 months history of solid food dysphagia which occurs  about the twice a month. He has not had any regurgitation of the food. Upper endoscopy in September 2008 showed mild esophageal stricture which was dilated with Savary dilators 15 and 17 mm. There was no Barrett's esophagus on biopsies.. There was a small hiatal hernia. Patient was started on all Protonix 40 mg daily but discontinue it  shortly afterward and has not been on any medications for  acid reflux. His colonoscopy was in May 2005 and it was a normal exam. His recall colonoscopy is due in May 2015. Patient has high blood pressure, diabetes and he is  overweight. He is a strong family history of heart disease in his brother who had a heart attack in his 78s and father who died of heart attack. He takes  nonsteroidal anti-inflammatory agents for his chronic back problems   GI Review of Systems    Reports dysphagia with solids.      Denies abdominal pain, acid reflux, belching, bloating, chest pain, dysphagia with liquids, heartburn, loss of appetite, nausea, vomiting, vomiting blood, weight loss, and  weight gain.        Denies anal fissure, black tarry stools, change in bowel habit, constipation, diarrhea, diverticulosis, fecal incontinence, heme positive stool, hemorrhoids, irritable bowel syndrome, jaundice, light color stool, liver problems, rectal bleeding, and  rectal pain.    Current Medications (verified): 1)  Lotrel 5-20 Mg Caps (Amlodipine Besy-Benazepril Hcl) .Marland Kitchen.. 1 By Mouth Once Daily 2)  Simvastatin 40 Mg Tabs (Simvastatin) .Marland Kitchen.. 1 By Mouth Once Daily 3)  Aspirin 81 Mg  Tbec (Aspirin) .... One By Mouth Every Day 4)  Januvia 100 Mg Tabs  (Sitagliptin Phosphate) .... Take 1 Tab By Mouth Daily 5)  Glipizide-Metformin Hcl 5-500 Mg Tabs (Glipizide-Metformin Hcl) .... Two Times A Day 6)  Freestyle Lite Test  Strp (Glucose Blood) .... Test Once Daily  Allergies (verified): No Known Drug Allergies  Past History:  Past Medical History: Reviewed history from 09/16/2007 and no changes required. Diabetes mellitus, type II Hypertension GERD Hyperlipidemia  Past Surgical History: Reviewed history from 09/16/2007 and no changes required. Colonoscopy 2005 Lymph nodes biopsy  Melanoma rt calf 1999 EGD with esophageal dilation  9-08 per Dr. Juanda Chance  Family History: Unremarkable No FH of Colon Cancer: Family History of Heart Disease: Father, Brother  Social History: Reviewed history from 09/16/2007 and no changes required. Married Never Smoked Alcohol use-no Drug use-no  Review of Systems       The patient complains of allergy/sinus and back pain.  The patient denies anemia, anxiety-new, arthritis/joint pain, blood in urine, breast changes/lumps, change in vision, confusion, cough, coughing up blood, depression-new, fainting, fatigue, fever, headaches-new, hearing problems, heart murmur, heart rhythm changes, itching, menstrual pain, muscle pains/cramps, night sweats, nosebleeds, pregnancy symptoms, shortness of breath, skin rash, sleeping problems, sore throat, swelling of feet/legs, swollen lymph glands, thirst - excessive , urination - excessive , urination changes/pain, urine leakage, vision changes, and voice change.         Pertinent positive and negative review of systems were noted in the above HPI. All other ROS was otherwise negative.   Vital Signs:  Patient profile:   55 year old male  Height:      71 inches Weight:      236 pounds BMI:     33.03 Pulse rate:   72 / minute Pulse rhythm:   regular BP sitting:   138 / 80  (left arm) Cuff size:   regular  Vitals Entered By: June McMurray CMA Duncan Dull) (November 05, 2009 10:23 AM)   Impression & Recommendations:  Problem # 1:  ESOPHAGEAL STRICTURE (ICD-530.3) recurrent solid food dysphagia consistent with recurrent mild esophageal stricture. Patient's symptoms are minimal , he chooses empirical treatment with  proton pump inhibitor  before scheduling upper endoscopy with dilatation. We will start Prilosec 20 mg a day .  Problem # 2:  SPECIAL SCREENING FOR MALIGNANT NEOPLASMS COLON (ICD-V76.51) last colonoscopy May 2005. Recall colonoscopy May 2015  Problem # 3:  HYPERLIPIDEMIA (ICD-272.4) denies any chest pains. Positive family history of cardiovascular disease at an early age. High blood pressure and diabetes. I asked him to check with Dr. Clent Ridges to see if he needs a stress test  Patient Instructions: 1)  Sent your Prilosec to your mail oder [facility]. 2)  Please continue current medications. 3)  discussed with Dr. Clent Ridges his cardiovascular risk factors and need for  evaluation 4)  call us if dysphagia continues and we will set up upper endoscopy with dilatation  5)  The medication list was reviewed and reconciled.  All changed / newly prescribed medications were explained.  A complete medication list was provided to the patient / caregiver. Prescriptions: PRILOSEC 20 MG CPDR (OMEPRAZOLE) one tablet by mouth once daily  #90 x 3   Entered and Authorized by:   Hart Carwin MD   Signed by:   Hart Carwin MD on 11/06/2009   Method used:   Faxed to ...       [facility] ([facility])       [address] [place], [place]  [address]       Ph: [phone]       Fax: [phone]   RxID:   [id number]

## 2010-08-30 NOTE — Assessment & Plan Note (Signed)
Summary: fu on meds/pt will come in fasting/njr   Vital Signs:  Patient profile:   55 year old male Weight:      231 pounds Temp:     97.8 degrees F oral Pulse rate:   81 / minute BP sitting:   122 / 84  (left arm) Cuff size:   large  Vitals Entered By: Alfred Levins, CMA (August 18, 2009 9:49 AM)  History of Present Illness: Here to follow up on lipids, BP, and diabetes. He feels fine, and he watches his diet closely. he does not check his glucoses at home however.   Current Medications (verified): 1)  Lotrel 5-20 Mg Caps (Amlodipine Besy-Benazepril Hcl) .Marland Kitchen.. 1 By Mouth Once Daily 2)  Simvastatin 40 Mg Tabs (Simvastatin) .Marland Kitchen.. 1 By Mouth Once Daily 3)  Aspirin 81 Mg  Tbec (Aspirin) .... One By Mouth Every Day 4)  Januvia 100 Mg Tabs (Sitagliptin Phosphate) .... Take 1 Tab By Mouth Daily 5)  Glipizide-Metformin Hcl 5-500 Mg Tabs (Glipizide-Metformin Hcl) .... Two Times A Day 6)  Freestyle Lite Test  Strp (Glucose Blood) .... Test Once Daily  Allergies (verified): No Known Drug Allergies  Past History:  Past Medical History: Reviewed history from 09/16/2007 and no changes required. Diabetes mellitus, type II Hypertension GERD Hyperlipidemia  Review of Systems  The patient denies anorexia, fever, weight loss, weight gain, vision loss, decreased hearing, hoarseness, chest pain, syncope, dyspnea on exertion, peripheral edema, prolonged cough, headaches, hemoptysis, abdominal pain, melena, hematochezia, severe indigestion/heartburn, hematuria, incontinence, genital sores, muscle weakness, suspicious skin lesions, transient blindness, difficulty walking, depression, unusual weight change, abnormal bleeding, enlarged lymph nodes, angioedema, breast masses, and testicular masses.    Physical Exam  General:  Well-developed,well-nourished,in no acute distress; alert,appropriate and cooperative throughout examination Neck:  No deformities, masses, or tenderness noted. Lungs:  Normal  respiratory effort, chest expands symmetrically. Lungs are clear to auscultation, no crackles or wheezes. Heart:  Normal rate and regular rhythm. S1 and S2 normal without gallop, murmur, click, rub or other extra sounds.   Impression & Recommendations:  Problem # 1:  HYPERLIPIDEMIA (ICD-272.4)  His updated medication list for this problem includes:    Simvastatin 40 Mg Tabs (Simvastatin) .Marland Kitchen... 1 by mouth once daily  Problem # 2:  HYPERTENSION (ICD-401.9)  His updated medication list for this problem includes:    Lotrel 5-20 Mg Caps (Amlodipine besy-benazepril hcl) .Marland Kitchen... 1 by mouth once daily  Problem # 3:  DIABETES MELLITUS, TYPE II (ICD-250.00)  His updated medication list for this problem includes:    Lotrel 5-20 Mg Caps (Amlodipine besy-benazepril hcl) .Marland Kitchen... 1 by mouth once daily    Aspirin 81 Mg Tbec (Aspirin) ..... One by mouth every day    Januvia 100 Mg Tabs (Sitagliptin phosphate) .Marland Kitchen... Take 1 tab by mouth daily    Glipizide-metformin Hcl 5-500 Mg Tabs (Glipizide-metformin hcl) .Marland Kitchen..Marland Kitchen Two times a day  Orders: Venipuncture (59563) TLB-Lipid Panel (80061-LIPID) TLB-BMP (Basic Metabolic Panel-BMET) (80048-METABOL) TLB-CBC Platelet - w/Differential (85025-CBCD) TLB-Hepatic/Liver Function Pnl (80076-HEPATIC) TLB-TSH (Thyroid Stimulating Hormone) (84443-TSH) TLB-Microalbumin/Creat Ratio, Urine (82043-MALB) TLB-A1C / Hgb A1C (Glycohemoglobin) (83036-A1C)  Complete Medication List: 1)  Lotrel 5-20 Mg Caps (Amlodipine besy-benazepril hcl) .Marland Kitchen.. 1 by mouth once daily 2)  Simvastatin 40 Mg Tabs (Simvastatin) .Marland Kitchen.. 1 by mouth once daily 3)  Aspirin 81 Mg Tbec (Aspirin) .... One by mouth every day 4)  Januvia 100 Mg Tabs (Sitagliptin phosphate) .... Take 1 tab by mouth daily 5)  Glipizide-metformin Hcl 5-500 Mg Tabs (  Glipizide-metformin hcl) .... Two times a day 6)  Freestyle Lite Test Strp (Glucose blood) .... Test once daily  Patient Instructions: 1)  Get fasting labs  today Prescriptions: GLIPIZIDE-METFORMIN HCL 5-500 MG TABS (GLIPIZIDE-METFORMIN HCL) two times a day  #180 x 3   Entered and Authorized by:   Nelwyn Salisbury MD   Signed by:   Nelwyn Salisbury MD on 08/18/2009   Method used:   Print then Give to Patient   RxID:   [id number] JANUVIA 100 MG TABS (SITAGLIPTIN PHOSPHATE) Take 1 tab by mouth daily  #90 x 3   Entered and Authorized by:   Nelwyn Salisbury MD   Signed by:   Nelwyn Salisbury MD on 08/18/2009   Method used:   Print then Give to Patient   RxID:   [id number] SIMVASTATIN 40 MG TABS (SIMVASTATIN) 1 by mouth once daily  #90 x 3   Entered and Authorized by:   Nelwyn Salisbury MD   Signed by:   Nelwyn Salisbury MD on 08/18/2009   Method used:   Print then Give to Patient   RxID:   [id number] LOTREL 5-20 MG CAPS (AMLODIPINE BESY-BENAZEPRIL HCL) 1 by mouth once daily  #90 x 3   Entered and Authorized by:   Nelwyn Salisbury MD   Signed by:   Nelwyn Salisbury MD on 08/18/2009   Method used:   Print then Give to Patient   RxID:   [id number]   Laboratory Results   Blood Tests    Date/Time Reported: August 18, 2009 9:49 AM  CBG Fasting:: 147mg /dL  Comments: Alfred Levins, CMA  August 18, 2009 9:54 AM     Appended Document: fu on meds/pt will come in fasting/njr  Laboratory Results   Urine Tests    Routine Urinalysis   Color: yellow Appearance: Clear Glucose: negative   (Normal Range: Negative) Bilirubin: negative   (Normal Range: Negative) Ketone: trace (5)   (Normal Range: Negative) Spec. Gravity: .sign   (Normal Range: 1.003-1.035) Blood: negative   (Normal Range: Negative) pH: 5.0   (Normal Range: 5.0-8.0) Protein: 1+   (Normal Range: Negative) Urobilinogen: 0.2   (Normal Range: 0-1) Nitrite: negative   (Normal Range: Negative) Leukocyte Esterace: negative   (Normal Range: Negative)

## 2010-09-01 NOTE — Assessment & Plan Note (Signed)
Summary: CPX (PT WILL COME IN FASTING) // RS   Vital Signs:  Patient profile:   55 year old male Height:      69 inches Weight:      235 pounds BMI:     34.83 O2 Sat:      95 % Temp:     98.5 degrees F Pulse rate:   67 / minute BP sitting:   144 / 100  (left arm) Cuff size:   large  Vitals Entered By: Pura Spice, RN (August 23, 2010 9:11 AM)  CC: cpx fasting states had DOT PE 2 wks ago at [facility]   Is Patient Diabetic? Yes Did you bring your meter with you today? No   History of Present Illness: 55 yr old male for a cpx. He feels good with no complaints. He had a DOT exam a few weeks ago and was told his BP was a little high. He does not check this at home.   Allergies (verified): No Known Drug Allergies  Past History:  Past Medical History: Reviewed history from 09/16/2007 and no changes required. Diabetes mellitus, type II Hypertension GERD Hyperlipidemia  Past Surgical History: Colonoscopy May 2005, normal, repeat in 10 yrs Lymph nodes biopsy  Melanoma rt calf 1999 EGD with esophageal dilation  9-08 per Dr. Juanda Chance  Family History: Reviewed history from 11/05/2009 and no changes required.  No FH of Colon Cancer: Family History of Heart Disease: Father, Brother  Social History: Reviewed history from 09/16/2007 and no changes required. Married Never Smoked Alcohol use-no Drug use-no  Review of Systems  The patient denies anorexia, fever, weight loss, weight gain, vision loss, decreased hearing, hoarseness, chest pain, syncope, dyspnea on exertion, peripheral edema, prolonged cough, headaches, hemoptysis, abdominal pain, melena, hematochezia, severe indigestion/heartburn, hematuria, incontinence, genital sores, muscle weakness, suspicious skin lesions, transient blindness, difficulty walking, depression, unusual weight change, abnormal bleeding, enlarged lymph nodes, angioedema, breast masses, and testicular masses.    Physical Exam  General:   Well-developed,well-nourished,in no acute distress; alert,appropriate and cooperative throughout examination Head:  Normocephalic and atraumatic without obvious abnormalities. No apparent alopecia or balding. Eyes:  No corneal or conjunctival inflammation noted. EOMI. Perrla. Funduscopic exam benign, without hemorrhages, exudates or papilledema. Vision grossly normal. Ears:  External ear exam shows no significant lesions or deformities.  Otoscopic examination reveals clear canals, tympanic membranes are intact bilaterally without bulging, retraction, inflammation or discharge. Hearing is grossly normal bilaterally. Nose:  External nasal examination shows no deformity or inflammation. Nasal mucosa are pink and moist without lesions or exudates. Mouth:  Oral mucosa and oropharynx without lesions or exudates.  Teeth in good repair. Neck:  No deformities, masses, or tenderness noted. Chest Wall:  No deformities, masses, tenderness or gynecomastia noted. Lungs:  Normal respiratory effort, chest expands symmetrically. Lungs are clear to auscultation, no crackles or wheezes. Heart:  Normal rate and regular rhythm. S1 and S2 normal without gallop, murmur, click, rub or other extra sounds. EKG normal Abdomen:  Bowel sounds positive,abdomen soft and non-tender without masses, organomegaly or hernias noted. Rectal:  No external abnormalities noted. Normal sphincter tone. No rectal masses or tenderness. Heme neg.  Genitalia:  Testes bilaterally descended without nodularity, tenderness or masses. No scrotal masses or lesions. No penis lesions or urethral discharge. Prostate:  Prostate gland firm and smooth, no enlargement, nodularity, tenderness, mass, asymmetry or induration. Msk:  No deformity or scoliosis noted of thoracic or lumbar spine.   Pulses:  R and L carotid,radial,femoral,dorsalis pedis and  posterior tibial pulses are full and equal bilaterally Extremities:  No clubbing, cyanosis, edema, or deformity  noted with normal full range of motion of all joints.   Neurologic:  No cranial nerve deficits noted. Station and gait are normal. Plantar reflexes are down-going bilaterally. DTRs are symmetrical throughout. Sensory, motor and coordinative functions appear intact. Skin:  Intact without suspicious lesions or rashes Cervical Nodes:  No lymphadenopathy noted Axillary Nodes:  No palpable lymphadenopathy Inguinal Nodes:  No significant adenopathy Psych:  Cognition and judgment appear intact. Alert and cooperative with normal attention span and concentration. No apparent delusions, illusions, hallucinations   Impression & Recommendations:  Problem # 1:  HEALTH MAINTENANCE EXAM (ICD-V70.0)  Orders: UA Dipstick w/o Micro (automated)  (81003) Hemoccult Guaiac-1 spec.(in office) (82270) EKG w/ Interpretation (93000) Venipuncture (01027) TLB-Lipid Panel (80061-LIPID) TLB-BMP (Basic Metabolic Panel-BMET) (80048-METABOL) TLB-CBC Platelet - w/Differential (85025-CBCD) TLB-Hepatic/Liver Function Pnl (80076-HEPATIC) TLB-TSH (Thyroid Stimulating Hormone) (84443-TSH) TLB-PSA (Prostate Specific Antigen) (84153-PSA) Specimen Handling (25366)  Complete Medication List: 1)  Simvastatin 40 Mg Tabs (Simvastatin) .Marland Kitchen.. 1 by mouth once daily 2)  Aspirin 81 Mg Tbec (Aspirin) .... One by mouth every day 3)  Januvia 100 Mg Tabs (Sitagliptin phosphate) .... Take 1 tab by mouth daily 4)  Glipizide-metformin Hcl 5-500 Mg Tabs (Glipizide-metformin hcl) .... Two times a day 5)  Freestyle Lite Test Strp (Glucose blood) .... Test once daily 6)  Omeprazole 20 Mg Cpdr (Omeprazole) .... Take 1 tablet by mouth once a day 7)  Lotrel 10-20 Mg Caps (Amlodipine besy-benazepril hcl) .... Once daily  Other Orders: TLB-A1C / Hgb A1C (Glycohemoglobin) (83036-A1C)  Patient Instructions: 1)  It is important that you exercise reguarly at least 20 minutes 5 times a week. If you develop chest pain, have severe difficulty  breathing, or feel very tired, stop exercising immediately and seek medical attention.  2)  You need to lose weight. Consider a lower calorie diet and regular exercise.  3)  Increase Lotrel as above. 4)  get fasting labs today  Prescriptions: OMEPRAZOLE 20 MG CPDR (OMEPRAZOLE) Take 1 tablet by mouth once a day  #90 x 3   Entered and Authorized by:   Nelwyn Salisbury MD   Signed by:   Nelwyn Salisbury MD on 08/23/2010   Method used:   Print then Give to Patient   RxID:   [id number] GLIPIZIDE-METFORMIN HCL 5-500 MG TABS (GLIPIZIDE-METFORMIN HCL) two times a day  #180 x 3   Entered and Authorized by:   Nelwyn Salisbury MD   Signed by:   Nelwyn Salisbury MD on 08/23/2010   Method used:   Print then Give to Patient   RxID:   [id number] JANUVIA 100 MG TABS (SITAGLIPTIN PHOSPHATE) Take 1 tab by mouth daily  #90 x 3   Entered and Authorized by:   Nelwyn Salisbury MD   Signed by:   Nelwyn Salisbury MD on 08/23/2010   Method used:   Print then Give to Patient   RxID:   [id number] SIMVASTATIN 40 MG TABS (SIMVASTATIN) 1 by mouth once daily  #90 x 3   Entered and Authorized by:   Nelwyn Salisbury MD   Signed by:   Nelwyn Salisbury MD on 08/23/2010   Method used:   Print then Give to Patient   RxID:   [id number] LOTREL 10-20 MG CAPS (AMLODIPINE BESY-BENAZEPRIL HCL) once daily  #90 x 3   Entered and Authorized by:   Nelwyn Salisbury MD  Signed by:   Nelwyn Salisbury MD on 08/23/2010   Method used:   Print then Give to Patient   RxID:   [id number]    Orders Added: 1)  Est. Patient 40-64 years [99396] 2)  UA Dipstick w/o Micro (automated)  [81003] 3)  Hemoccult Guaiac-1 spec.(in office) [82270] 4)  EKG w/ Interpretation [93000] 5)  Venipuncture [36415] 6)  TLB-Lipid Panel [80061-LIPID] 7)  TLB-BMP (Basic Metabolic Panel-BMET) [80048-METABOL] 8)  TLB-CBC Platelet - w/Differential [85025-CBCD] 9)  TLB-Hepatic/Liver Function Pnl [80076-HEPATIC] 10)  TLB-TSH (Thyroid Stimulating Hormone)  [84443-TSH] 11)  TLB-A1C / Hgb A1C (Glycohemoglobin) [83036-A1C] 12)  TLB-PSA (Prostate Specific Antigen) [95284-XLK] 13)  Specimen Handling [99000]  Appended Document: Orders Update     Clinical Lists Changes  Observations: Added new observation of COMMENTS: Wynona Canes, CMA  August 23, 2010 11:37 AM  (08/23/2010 11:36) Added new observation of PH URINE: 5.5  (08/23/2010 11:36) Added new observation of SPEC GR URIN: 1.020  (08/23/2010 11:36) Added new observation of APPEARANCE U: Clear  (08/23/2010 11:36) Added new observation of UA COLOR: yellow  (08/23/2010 11:36) Added new observation of WBC DIPSTK U: negative  (08/23/2010 11:36) Added new observation of NITRITE URN: negative  (08/23/2010 11:36) Added new observation of UROBILINOGEN: 0.2  (08/23/2010 11:36) Added new observation of PROTEIN, URN: 1+  (08/23/2010 11:36) Added new observation of BLOOD UR DIP: negative  (08/23/2010 11:36) Added new observation of KETONES URN: negative  (08/23/2010 11:36) Added new observation of BILIRUBIN UR: negative  (08/23/2010 11:36) Added new observation of GLUCOSE, URN: negative  (08/23/2010 11:36)      Laboratory Results   Urine Tests  Date/Time Recieved: August 23, 2010 11:37 AM  Date/Time Reported: August 23, 2010 11:37 AM   Routine Urinalysis   Color: yellow Appearance: Clear Glucose: negative   (Normal Range: Negative) Bilirubin: negative   (Normal Range: Negative) Ketone: negative   (Normal Range: Negative) Spec. Gravity: 1.020   (Normal Range: 1.003-1.035) Blood: negative   (Normal Range: Negative) pH: 5.5   (Normal Range: 5.0-8.0) Protein: 1+   (Normal Range: Negative) Urobilinogen: 0.2   (Normal Range: 0-1) Nitrite: negative   (Normal Range: Negative) Leukocyte Esterace: negative   (Normal Range: Negative)    Comments: Wynona Canes, CMA  August 23, 2010 11:37 AM

## 2010-12-09 ENCOUNTER — Ambulatory Visit
Payer: Worker's Compensation | Attending: Physical Medicine & Rehabilitation | Admitting: Rehabilitative and Restorative Service Providers"

## 2010-12-09 DIAGNOSIS — R269 Unspecified abnormalities of gait and mobility: Secondary | ICD-10-CM | POA: Insufficient documentation

## 2010-12-09 DIAGNOSIS — M6281 Muscle weakness (generalized): Secondary | ICD-10-CM | POA: Insufficient documentation

## 2010-12-09 DIAGNOSIS — M25549 Pain in joints of unspecified hand: Secondary | ICD-10-CM | POA: Insufficient documentation

## 2010-12-09 DIAGNOSIS — Z5189 Encounter for other specified aftercare: Secondary | ICD-10-CM | POA: Insufficient documentation

## 2010-12-09 DIAGNOSIS — R279 Unspecified lack of coordination: Secondary | ICD-10-CM | POA: Insufficient documentation

## 2010-12-09 DIAGNOSIS — M2560 Stiffness of unspecified joint, not elsewhere classified: Secondary | ICD-10-CM | POA: Insufficient documentation

## 2010-12-13 ENCOUNTER — Ambulatory Visit: Payer: Worker's Compensation | Admitting: Occupational Therapy

## 2010-12-13 NOTE — Assessment & Plan Note (Signed)
HEALTHCARE                         GASTROENTEROLOGY OFFICE NOTE   NAME:Colton Bush                       MRN:          [id number]  DATE:02/22/2007                            DOB:          [birth year]    Colton Bush is a very nice 55 year old gentleman who is here today because  of solid food dysphagia of about 2 years' duration.   He was diagnosed with a hiatal hernia about 25 years ago but never  really took any medications for it.  About 2 years ago, he has noticed  rice and some food sticks.  Since then episodes have become more  frequent and he has had several episodes of regurgitation.  He has not  had a food impaction yet but he has been very careful.  Several days ago  a spaghetti dinner got stuck and he had to flush it with water or  vomited.  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  His schedule is irregular and he eats out a lot.  He denies any  dysphagia to liquids.  He denies taking any Rolaids.  He tried Prilosec  from his sister-in-law but did not think it made any difference.  We saw  Colton Bush in 2005, for screening colonoscopy which was essentially  normal.  He is a diabetic too.   MEDICATIONS:  1. Glyburide.  2. Metformin 2.5/500, two p.o. b.i.d.  3. Amlodipine 10 mg p.o. daily.  4. Lisinopril HCTZ 20/25, one p.o. daily.  5. Simvastatin 40 mg p.o. daily.   PAST HISTORY:  1. High blood pressure.  2. Diabetes.   OPERATIONS:  None.   FAMILY HISTORY:  Negative for colon cancer.   SOCIAL HISTORY:  Married with 1 child.  He works as an Physicist, medical.  He does not smoke, does not drink alcohol.   REVIEW OF SYSTEMS:  Positive for dysphagia, no other problems.  Colonoscopy on Dec 14, 2003, was normal.   PHYSICAL EXAMINATION:  VITAL SIGNS:  Blood pressure 100/68, pulse 80,  and weight 227 pounds, respirations which is about 20 pound intentional  weight loss.  GENERAL:  He is alert, oriented.  HEENT:  Sclerae are nonicteric.  NECK:  Supple.  No adenopathy.  LUNGS:  Clear to auscultation.  COR:  Normal S1 S2.  ABDOMEN:  Soft and nontender.  Liver edge at costal margin.  Bowel  sounds were normoactive.  Lower abdomen was normal.  RECTAL:  Not done.  EXTREMITIES:  No edema.   IMPRESSION:  A 55 year old white male with solid food dysphagia which  has progressed over the past 2 years and is consistent with benign  distal esophageal stricture.  He was diagnosed with a hiatal hernia 25  years ago and I suspect that he has had chronic gastroesophageal reflux  for some time.  His symptoms do not suggest nocturnal aspiration or  laryngeal pharyngeal reflux but as a diabetic he may have some elements  of gastroparesis or possibly esophageal dysmotility which may be  compounding the problem of reflux.  Also, he has an  irregular schedule  and eating habits may be causing more problems for his reflux.   PLAN:  1. The patient will need an upper endoscopy with esophageal dilation.      At the same time, we will obtain biopsies to rule out Barrett's      esophagus.  2. Antireflux measures which will include a reduction of his caffeine      intake and continued reduction of his weight.  3. Samples of Protonix 40 mg daily given.  4. Depending on the results of the endoscopy, he will probably have to      take PPI on a regular basis to keep the stricture open.     Hedwig Morton. Juanda Chance, MD  Electronically Signed    DMB/MedQ  DD: 02/22/2007  DT: 02/22/2007  Job #: [id number]   cc:   Colton Senior A. Clent Ridges, MD

## 2010-12-14 ENCOUNTER — Encounter: Payer: Worker's Compensation | Admitting: Occupational Therapy

## 2010-12-15 ENCOUNTER — Encounter: Payer: Worker's Compensation | Admitting: Occupational Therapy

## 2010-12-16 ENCOUNTER — Encounter: Payer: Self-pay | Admitting: Family Medicine

## 2010-12-16 ENCOUNTER — Other Ambulatory Visit: Payer: Self-pay

## 2010-12-16 ENCOUNTER — Ambulatory Visit (INDEPENDENT_AMBULATORY_CARE_PROVIDER_SITE_OTHER): Payer: Worker's Compensation | Admitting: Family Medicine

## 2010-12-16 ENCOUNTER — Ambulatory Visit: Payer: Worker's Compensation | Admitting: Occupational Therapy

## 2010-12-16 VITALS — BP 138/78 | HR 94 | Temp 98.4°F | Wt 224.0 lb

## 2010-12-16 DIAGNOSIS — E119 Type 2 diabetes mellitus without complications: Secondary | ICD-10-CM

## 2010-12-16 DIAGNOSIS — I10 Essential (primary) hypertension: Secondary | ICD-10-CM

## 2010-12-16 DIAGNOSIS — E785 Hyperlipidemia, unspecified: Secondary | ICD-10-CM

## 2010-12-16 DIAGNOSIS — M4802 Spinal stenosis, cervical region: Secondary | ICD-10-CM

## 2010-12-16 LAB — HEPATIC FUNCTION PANEL
ALT: 20 U/L (ref 0–53)
AST: 15 U/L (ref 0–37)
Albumin: 3.9 g/dL (ref 3.5–5.2)
Alkaline Phosphatase: 73 U/L (ref 39–117)
Bilirubin, Direct: 0.1 mg/dL (ref 0.0–0.3)
Total Bilirubin: 0.3 mg/dL (ref 0.3–1.2)
Total Protein: 7.1 g/dL (ref 6.0–8.3)

## 2010-12-16 LAB — CBC WITH DIFFERENTIAL/PLATELET
Basophils Absolute: 0.1 10*3/uL (ref 0.0–0.1)
Basophils Relative: 1.1 % (ref 0.0–3.0)
Eosinophils Absolute: 0.5 10*3/uL (ref 0.0–0.7)
Eosinophils Relative: 5.4 % — ABNORMAL HIGH (ref 0.0–5.0)
HCT: 43.2 % (ref 39.0–52.0)
Hemoglobin: 14.6 g/dL (ref 13.0–17.0)
Lymphocytes Relative: 22.8 % (ref 12.0–46.0)
Lymphs Abs: 2.3 10*3/uL (ref 0.7–4.0)
MCHC: 33.8 g/dL (ref 30.0–36.0)
MCV: 92 fl (ref 78.0–100.0)
Monocytes Absolute: 0.6 10*3/uL (ref 0.1–1.0)
Monocytes Relative: 6 % (ref 3.0–12.0)
Neutro Abs: 6.4 10*3/uL (ref 1.4–7.7)
Neutrophils Relative %: 64.7 % (ref 43.0–77.0)
Platelets: 323 10*3/uL (ref 150.0–400.0)
RBC: 4.69 Mil/uL (ref 4.22–5.81)
RDW: 13.2 % (ref 11.5–14.6)
WBC: 9.9 10*3/uL (ref 4.5–10.5)

## 2010-12-16 LAB — BASIC METABOLIC PANEL
BUN: 20 mg/dL (ref 6–23)
CO2: 29 mEq/L (ref 19–32)
Calcium: 9.6 mg/dL (ref 8.4–10.5)
Chloride: 102 mEq/L (ref 96–112)
Creatinine, Ser: 0.7 mg/dL (ref 0.4–1.5)
GFR: 132.11 mL/min (ref >60.00)
Glucose, Bld: 193 mg/dL — ABNORMAL HIGH (ref 70–99)
Potassium: 4.5 mEq/L (ref 3.5–5.1)
Sodium: 139 mEq/L (ref 135–145)

## 2010-12-16 LAB — TSH: TSH: 2.18 u[IU]/mL (ref 0.35–5.50)

## 2010-12-16 LAB — POCT URINALYSIS DIPSTICK
Bilirubin, UA: NEGATIVE
Leukocytes, UA: NEGATIVE
Nitrite, UA: POSITIVE
Spec Grav, UA: 1.03
Urobilinogen, UA: 0.2
pH, UA: 5

## 2010-12-16 LAB — HEMOGLOBIN A1C: Hgb A1c MFr Bld: 7.6 % — ABNORMAL HIGH (ref 4.6–6.5)

## 2010-12-16 MED ORDER — METFORMIN HCL 1000 MG PO TABS: 1000.0000 mg | ORAL_TABLET | Freq: Two times a day (BID) | ORAL | Status: DC

## 2010-12-16 MED ORDER — CIPROFLOXACIN HCL 500 MG PO TABS: 500.0000 mg | ORAL_TABLET | Freq: Two times a day (BID) | ORAL | Status: AC

## 2010-12-16 NOTE — Telephone Encounter (Signed)
Pt is aware rx have been called in to [facility]

## 2010-12-16 NOTE — Progress Notes (Signed)
Quick Note:  Pt is aware. ______ 

## 2010-12-16 NOTE — Progress Notes (Signed)
  Subjective:    Patient ID: Colton Bush, male    DOB: 1955/11/14, 55 y.o.   MRN: [id number]  HPI Here to follow up on general medical issues after a traumatic several months. On 10-26-10 while working he slipped and feel against the side of a truck, and this injured his neck. This caused spinal cord compression from C4 to C7 with a combination of spinal stenosis and some herniated discs. He was in [place] at the time, so all his care for this has been in [place]. This is a Workers Comp case. His WC adjusters name is Colver at [phone]. His rehab doctor is Dr. Estil Daft at [facility], and his neurosurgeon is Dr. Tye Maryland at [facility]. After the accident he spent 6 weeks as an inpatient at the rehab facility, and he is now getting outpatient rehab. He wears a hard cervical collar at all times. He is on chronic pain meds, including gabapentin and Percocet. He is on his usual meds for lipids, HTN, and diabetes. He has some weakness in the right hand but this is much improved. He can feed himself now and write with the right hand. His left hand however is still extremely weak and he has almost no use of it.    Review of Systems  Respiratory: Negative.   Cardiovascular: Negative.   Neurological: Positive for weakness and numbness.       Objective:   Physical Exam  Constitutional: He is oriented to person, place, and time.  Cardiovascular: Normal rate, regular rhythm, normal heart sounds and intact distal pulses.   Pulmonary/Chest: Effort normal and breath sounds normal.  Neurological: He is alert and oriented to person, place, and time.       His grip strength on the right is 4/6 but the left hand is only 1/6. The left hand is quite swollen and cool.           Assessment & Plan:  He is slowly recovering from a severe spinal cord injury, and he will likely require surgery on his neck in the next month. We will get some labs today.

## 2010-12-20 ENCOUNTER — Ambulatory Visit: Payer: Worker's Compensation | Admitting: Occupational Therapy

## 2010-12-20 ENCOUNTER — Ambulatory Visit: Payer: Worker's Compensation | Admitting: Physical Therapy

## 2010-12-22 ENCOUNTER — Ambulatory Visit: Payer: Worker's Compensation | Admitting: Occupational Therapy

## 2010-12-23 ENCOUNTER — Ambulatory Visit: Payer: Worker's Compensation | Admitting: Physical Therapy

## 2010-12-27 ENCOUNTER — Ambulatory Visit: Payer: Worker's Compensation | Admitting: Occupational Therapy

## 2010-12-27 ENCOUNTER — Ambulatory Visit: Payer: Worker's Compensation | Admitting: Physical Therapy

## 2010-12-28 ENCOUNTER — Ambulatory Visit: Payer: Worker's Compensation | Admitting: Occupational Therapy

## 2010-12-30 ENCOUNTER — Ambulatory Visit: Payer: Worker's Compensation | Attending: Family Medicine | Admitting: Occupational Therapy

## 2010-12-30 ENCOUNTER — Ambulatory Visit: Payer: Worker's Compensation | Admitting: Physical Therapy

## 2010-12-30 DIAGNOSIS — Z5189 Encounter for other specified aftercare: Secondary | ICD-10-CM | POA: Insufficient documentation

## 2010-12-30 DIAGNOSIS — M25549 Pain in joints of unspecified hand: Secondary | ICD-10-CM | POA: Insufficient documentation

## 2010-12-30 DIAGNOSIS — R279 Unspecified lack of coordination: Secondary | ICD-10-CM | POA: Insufficient documentation

## 2010-12-30 DIAGNOSIS — M6281 Muscle weakness (generalized): Secondary | ICD-10-CM | POA: Insufficient documentation

## 2010-12-30 DIAGNOSIS — M2560 Stiffness of unspecified joint, not elsewhere classified: Secondary | ICD-10-CM | POA: Insufficient documentation

## 2010-12-30 DIAGNOSIS — R269 Unspecified abnormalities of gait and mobility: Secondary | ICD-10-CM | POA: Insufficient documentation

## 2011-01-02 ENCOUNTER — Ambulatory Visit: Payer: Worker's Compensation | Admitting: Occupational Therapy

## 2011-01-03 ENCOUNTER — Ambulatory Visit: Payer: Worker's Compensation | Admitting: Physical Therapy

## 2011-01-03 ENCOUNTER — Ambulatory Visit: Payer: Worker's Compensation | Admitting: Occupational Therapy

## 2011-01-05 ENCOUNTER — Ambulatory Visit: Payer: Worker's Compensation | Admitting: Physical Therapy

## 2011-01-05 ENCOUNTER — Ambulatory Visit: Payer: Worker's Compensation | Admitting: Occupational Therapy

## 2011-01-09 ENCOUNTER — Encounter: Payer: Worker's Compensation | Admitting: Occupational Therapy

## 2011-01-10 ENCOUNTER — Ambulatory Visit: Payer: Worker's Compensation | Admitting: Occupational Therapy

## 2011-01-10 ENCOUNTER — Ambulatory Visit: Payer: Worker's Compensation | Admitting: Physical Therapy

## 2011-01-12 ENCOUNTER — Ambulatory Visit: Payer: Worker's Compensation | Admitting: Physical Therapy

## 2011-01-12 ENCOUNTER — Ambulatory Visit: Payer: Worker's Compensation | Admitting: Occupational Therapy

## 2011-01-17 ENCOUNTER — Ambulatory Visit: Payer: Worker's Compensation | Admitting: Physical Therapy

## 2011-01-17 ENCOUNTER — Ambulatory Visit: Payer: Worker's Compensation | Admitting: Occupational Therapy

## 2011-01-18 ENCOUNTER — Ambulatory Visit: Payer: Worker's Compensation | Admitting: Physical Therapy

## 2011-01-18 ENCOUNTER — Ambulatory Visit: Payer: Worker's Compensation | Admitting: Occupational Therapy

## 2011-01-19 ENCOUNTER — Ambulatory Visit: Payer: Worker's Compensation | Admitting: Rehabilitative and Restorative Service Providers"

## 2011-01-19 ENCOUNTER — Encounter: Payer: Worker's Compensation | Admitting: Occupational Therapy

## 2011-01-20 ENCOUNTER — Ambulatory Visit: Payer: Worker's Compensation | Admitting: Physical Therapy

## 2011-01-20 ENCOUNTER — Ambulatory Visit: Payer: Worker's Compensation | Admitting: Occupational Therapy

## 2011-01-23 ENCOUNTER — Ambulatory Visit: Payer: Worker's Compensation | Admitting: Occupational Therapy

## 2011-01-25 ENCOUNTER — Ambulatory Visit: Payer: Worker's Compensation | Admitting: Occupational Therapy

## 2011-01-25 ENCOUNTER — Ambulatory Visit: Payer: Worker's Compensation | Admitting: Physical Therapy

## 2011-01-27 ENCOUNTER — Ambulatory Visit: Payer: Worker's Compensation | Admitting: Occupational Therapy

## 2011-01-30 ENCOUNTER — Ambulatory Visit: Payer: Worker's Compensation | Attending: Physical Medicine & Rehabilitation | Admitting: Occupational Therapy

## 2011-01-30 DIAGNOSIS — R279 Unspecified lack of coordination: Secondary | ICD-10-CM | POA: Insufficient documentation

## 2011-01-30 DIAGNOSIS — M2560 Stiffness of unspecified joint, not elsewhere classified: Secondary | ICD-10-CM | POA: Insufficient documentation

## 2011-01-30 DIAGNOSIS — Z5189 Encounter for other specified aftercare: Secondary | ICD-10-CM | POA: Insufficient documentation

## 2011-01-30 DIAGNOSIS — M6281 Muscle weakness (generalized): Secondary | ICD-10-CM | POA: Insufficient documentation

## 2011-01-30 DIAGNOSIS — R269 Unspecified abnormalities of gait and mobility: Secondary | ICD-10-CM | POA: Insufficient documentation

## 2011-01-30 DIAGNOSIS — M25549 Pain in joints of unspecified hand: Secondary | ICD-10-CM | POA: Insufficient documentation

## 2011-01-31 ENCOUNTER — Ambulatory Visit: Payer: Worker's Compensation | Admitting: Physical Therapy

## 2011-01-31 ENCOUNTER — Ambulatory Visit: Payer: Worker's Compensation | Admitting: Occupational Therapy

## 2011-02-07 ENCOUNTER — Ambulatory Visit: Payer: Worker's Compensation | Admitting: Physical Therapy

## 2011-02-07 ENCOUNTER — Ambulatory Visit: Payer: Worker's Compensation | Admitting: Occupational Therapy

## 2011-02-09 ENCOUNTER — Ambulatory Visit: Payer: Worker's Compensation | Admitting: Physical Therapy

## 2011-02-09 ENCOUNTER — Ambulatory Visit: Payer: Worker's Compensation | Admitting: Occupational Therapy

## 2011-02-13 ENCOUNTER — Ambulatory Visit: Payer: Worker's Compensation | Admitting: Occupational Therapy

## 2011-02-17 ENCOUNTER — Ambulatory Visit: Payer: Worker's Compensation | Admitting: Occupational Therapy

## 2011-02-23 ENCOUNTER — Ambulatory Visit: Payer: Worker's Compensation | Admitting: Occupational Therapy

## 2011-02-24 ENCOUNTER — Ambulatory Visit: Payer: Worker's Compensation | Admitting: Occupational Therapy

## 2011-02-27 ENCOUNTER — Encounter: Payer: Worker's Compensation | Admitting: Occupational Therapy

## 2011-02-28 ENCOUNTER — Ambulatory Visit: Payer: Worker's Compensation | Admitting: Occupational Therapy

## 2011-03-01 ENCOUNTER — Ambulatory Visit: Payer: Worker's Compensation | Attending: Family Medicine | Admitting: Occupational Therapy

## 2011-03-01 DIAGNOSIS — R269 Unspecified abnormalities of gait and mobility: Secondary | ICD-10-CM | POA: Insufficient documentation

## 2011-03-01 DIAGNOSIS — M6281 Muscle weakness (generalized): Secondary | ICD-10-CM | POA: Insufficient documentation

## 2011-03-01 DIAGNOSIS — M25549 Pain in joints of unspecified hand: Secondary | ICD-10-CM | POA: Insufficient documentation

## 2011-03-01 DIAGNOSIS — R279 Unspecified lack of coordination: Secondary | ICD-10-CM | POA: Insufficient documentation

## 2011-03-01 DIAGNOSIS — Z5189 Encounter for other specified aftercare: Secondary | ICD-10-CM | POA: Insufficient documentation

## 2011-03-01 DIAGNOSIS — M2560 Stiffness of unspecified joint, not elsewhere classified: Secondary | ICD-10-CM | POA: Insufficient documentation

## 2011-03-06 ENCOUNTER — Ambulatory Visit: Payer: Worker's Compensation | Admitting: Occupational Therapy

## 2011-03-08 ENCOUNTER — Ambulatory Visit: Payer: Worker's Compensation | Admitting: Occupational Therapy

## 2011-03-08 ENCOUNTER — Telehealth: Payer: Self-pay | Admitting: Family Medicine

## 2011-03-08 MED ORDER — OMEPRAZOLE 20 MG PO CPDR: 20.0000 mg | DELAYED_RELEASE_CAPSULE | Freq: Every day | ORAL | Status: DC

## 2011-03-08 MED ORDER — SIMVASTATIN 40 MG PO TABS: 40.0000 mg | ORAL_TABLET | Freq: Every day | ORAL | Status: DC

## 2011-03-08 NOTE — Telephone Encounter (Signed)
Refill Omeprazole and Simvastatin to [facility]--[phone]---phone.

## 2011-03-14 ENCOUNTER — Ambulatory Visit: Payer: Worker's Compensation | Admitting: Occupational Therapy

## 2011-03-17 ENCOUNTER — Ambulatory Visit: Payer: Worker's Compensation | Admitting: Occupational Therapy

## 2011-03-21 ENCOUNTER — Ambulatory Visit: Payer: Worker's Compensation | Admitting: Occupational Therapy

## 2011-03-24 ENCOUNTER — Ambulatory Visit: Payer: Worker's Compensation | Admitting: Occupational Therapy

## 2011-03-27 ENCOUNTER — Ambulatory Visit: Payer: Worker's Compensation | Admitting: Occupational Therapy

## 2011-03-28 ENCOUNTER — Encounter: Payer: Worker's Compensation | Admitting: Occupational Therapy

## 2011-03-29 ENCOUNTER — Ambulatory Visit: Payer: Worker's Compensation | Admitting: Occupational Therapy

## 2011-04-04 ENCOUNTER — Ambulatory Visit: Payer: Worker's Compensation | Attending: Family Medicine | Admitting: Occupational Therapy

## 2011-04-04 DIAGNOSIS — Z5189 Encounter for other specified aftercare: Secondary | ICD-10-CM | POA: Insufficient documentation

## 2011-04-04 DIAGNOSIS — M2560 Stiffness of unspecified joint, not elsewhere classified: Secondary | ICD-10-CM | POA: Insufficient documentation

## 2011-04-04 DIAGNOSIS — R269 Unspecified abnormalities of gait and mobility: Secondary | ICD-10-CM | POA: Insufficient documentation

## 2011-04-04 DIAGNOSIS — R279 Unspecified lack of coordination: Secondary | ICD-10-CM | POA: Insufficient documentation

## 2011-04-04 DIAGNOSIS — M6281 Muscle weakness (generalized): Secondary | ICD-10-CM | POA: Insufficient documentation

## 2011-04-04 DIAGNOSIS — M25549 Pain in joints of unspecified hand: Secondary | ICD-10-CM | POA: Insufficient documentation

## 2011-04-06 ENCOUNTER — Ambulatory Visit: Payer: Worker's Compensation | Admitting: Occupational Therapy

## 2011-04-24 ENCOUNTER — Ambulatory Visit (INDEPENDENT_AMBULATORY_CARE_PROVIDER_SITE_OTHER): Payer: 59 | Admitting: Family Medicine

## 2011-04-24 ENCOUNTER — Encounter: Payer: Self-pay | Admitting: Family Medicine

## 2011-04-24 VITALS — BP 142/98 | HR 106 | Temp 98.2°F | Wt 239.0 lb

## 2011-04-24 DIAGNOSIS — H6690 Otitis media, unspecified, unspecified ear: Secondary | ICD-10-CM

## 2011-04-24 DIAGNOSIS — H6120 Impacted cerumen, unspecified ear: Secondary | ICD-10-CM

## 2011-04-24 MED ORDER — AMOXICILLIN-POT CLAVULANATE 875-125 MG PO TABS: 1.0000 | ORAL_TABLET | Freq: Two times a day (BID) | ORAL | Status: AC

## 2011-04-24 NOTE — Progress Notes (Signed)
  Subjective:    Patient ID: Colton Bush, male    DOB: [birth year], 55 y.o.   MRN: [id number]  HPI Here for several weeks of pressure and decreased hearing in both ears. No sinus symptoms. He has had some pain in the left ear for several days.    Review of Systems  Constitutional: Negative.   HENT: Positive for hearing loss. Negative for ear pain, congestion, sinus pressure and tinnitus.   Eyes: Negative.   Respiratory: Negative.        Objective:   Physical Exam  Constitutional: He appears well-developed and well-nourished.  HENT:  Nose: Nose normal.  Mouth/Throat: Oropharynx is clear and moist. No oropharyngeal exudate.       Both ear canals are full of cerumen. After flushing them clear, the left TM is red and dull   Eyes: Conjunctivae are normal. Pupils are equal, round, and reactive to light.  Neck: No thyromegaly present.  Pulmonary/Chest: Effort normal and breath sounds normal.  Lymphadenopathy:    He has no cervical adenopathy.          Assessment & Plan:  Both canals were irrigated clear with water. Use Augmentin for the otitis media.

## 2011-08-01 HISTORY — PX: CARPAL TUNNEL RELEASE: SHX101

## 2011-08-01 HISTORY — PX: ULNAR TUNNEL RELEASE: SHX820

## 2011-08-07 ENCOUNTER — Other Ambulatory Visit: Payer: Self-pay | Admitting: Neurosurgery

## 2011-08-07 ENCOUNTER — Other Ambulatory Visit: Payer: Self-pay | Admitting: Family Medicine

## 2011-08-07 NOTE — Telephone Encounter (Signed)
Pt called req 90 day supply sitaGLIPtan (JANUVIA) 100 MG tablet, simvastatin (ZOCOR) 40 MG tablet, metFORMIN (GLUCOPHAGE) 500 MG tablet,LOTREL 10-20 MG CAPS (AMLODIPINE BESY-BENAZEPRIL HCL) once daily,omeprazole (PRILOSEC) 20 MG capsule, called in to [facility] on [address].  If there are samples available for these med. Pt would really appreciate it. Pt is req that this be called in by Thurs or Friday of this week.

## 2011-08-08 ENCOUNTER — Encounter (HOSPITAL_COMMUNITY): Payer: Self-pay

## 2011-08-09 MED ORDER — GLIPIZIDE-METFORMIN HCL 5-500 MG PO TABS: 1.0000 | ORAL_TABLET | Freq: Two times a day (BID) | ORAL | Status: DC

## 2011-08-09 MED ORDER — SITAGLIPTIN PHOSPHATE 100 MG PO TABS: 100.0000 mg | ORAL_TABLET | Freq: Every day | ORAL | Status: DC

## 2011-08-09 MED ORDER — AMLODIPINE BESY-BENAZEPRIL HCL 10-20 MG PO CAPS: 1.0000 | ORAL_CAPSULE | Freq: Every day | ORAL | Status: DC

## 2011-08-09 MED ORDER — OMEPRAZOLE 20 MG PO CPDR: 20.0000 mg | DELAYED_RELEASE_CAPSULE | Freq: Every day | ORAL | Status: DC

## 2011-08-09 MED ORDER — SIMVASTATIN 40 MG PO TABS: 40.0000 mg | ORAL_TABLET | Freq: Every day | ORAL | Status: DC

## 2011-08-09 NOTE — Telephone Encounter (Signed)
rx sent in electronically 

## 2011-08-15 ENCOUNTER — Encounter (HOSPITAL_COMMUNITY): Payer: Self-pay

## 2011-08-15 ENCOUNTER — Inpatient Hospital Stay (HOSPITAL_COMMUNITY): Admission: RE | Admit: 2011-08-15 | Discharge: 2011-08-15 | Payer: Worker's Compensation | Source: Ambulatory Visit

## 2011-08-15 HISTORY — DX: Stiffness of unspecified hand, not elsewhere classified: M25.649

## 2011-08-15 HISTORY — DX: Personal history of other diseases of the digestive system: Z87.19

## 2011-08-15 HISTORY — DX: Depression, unspecified: F32.A

## 2011-08-15 HISTORY — DX: Major depressive disorder, single episode, unspecified: F32.9

## 2011-08-15 HISTORY — DX: Cervicalgia: M54.2

## 2011-08-15 HISTORY — DX: Bronchitis, not specified as acute or chronic: J40

## 2011-08-15 HISTORY — DX: Scoliosis, unspecified: M41.9

## 2011-08-15 NOTE — Progress Notes (Signed)
Pt doesn't have a cardiologist;medical MD manages HTN/hyperlipidemia-Dr.Steven Abran Cantor with [facility] on [address]  Has never had a heart cath/stress test/echo

## 2011-08-15 NOTE — Pre-Procedure Instructions (Signed)
20 YUG LORIA  08/15/2011   Your procedure is scheduled on:  Fri, Jan 18 @ 1240  Report to [facility] at 1040 AM.  Call this number if you have problems the morning of surgery: [phone]   Remember:   Do not eat food:After Midnight.  May have clear liquids: up to 4 Hours before arrival.(until 6:40 am)  Clear liquids include soda, tea, black coffee, apple or grape juice, broth.  Take these medicines the morning of surgery with A SIP OF WATER: Amlodipine,Cymbalta,Gabapentin,and Prilosec   Do not wear jewelry, make-up or nail polish.  Do not wear lotions, powders, or perfumes. You may wear deodorant.  Do not shave 48 hours prior to surgery.  Do not bring valuables to the hospital.  Contacts, dentures or bridgework may not be worn into surgery.  Leave suitcase in the car. After surgery it may be brought to your room.  For patients admitted to the hospital, checkout time is 11:00 AM the day of discharge.   Patients discharged the day of surgery will not be allowed to drive home.  Name and phone number of your driver:   Special Instructions: CHG Shower Use Special Wash: 1/2 bottle night before surgery and 1/2 bottle morning of surgery.   Please read over the following fact sheets that you were given: Pain Booklet, Coughing and Deep Breathing, MRSA Information and Surgical Site Infection Prevention

## 2011-08-16 ENCOUNTER — Encounter (HOSPITAL_COMMUNITY)
Admission: RE | Admit: 2011-08-16 | Discharge: 2011-08-16 | Disposition: A | Discharge status: Home / Self Care | Payer: Worker's Compensation | Source: Ambulatory Visit | Attending: Neurosurgery | Admitting: Neurosurgery

## 2011-08-16 ENCOUNTER — Ambulatory Visit (HOSPITAL_COMMUNITY)
Admission: RE | Admit: 2011-08-16 | Discharge: 2011-08-16 | Disposition: A | Discharge status: Home / Self Care | Payer: Worker's Compensation | Source: Ambulatory Visit | Attending: Anesthesiology | Admitting: Anesthesiology

## 2011-08-16 ENCOUNTER — Other Ambulatory Visit: Payer: Self-pay

## 2011-08-16 DIAGNOSIS — Z01818 Encounter for other preprocedural examination: Secondary | ICD-10-CM | POA: Insufficient documentation

## 2011-08-16 DIAGNOSIS — I10 Essential (primary) hypertension: Secondary | ICD-10-CM | POA: Insufficient documentation

## 2011-08-16 DIAGNOSIS — Z01810 Encounter for preprocedural cardiovascular examination: Secondary | ICD-10-CM | POA: Insufficient documentation

## 2011-08-16 DIAGNOSIS — Z01812 Encounter for preprocedural laboratory examination: Secondary | ICD-10-CM | POA: Insufficient documentation

## 2011-08-16 DIAGNOSIS — M4120 Other idiopathic scoliosis, site unspecified: Secondary | ICD-10-CM | POA: Insufficient documentation

## 2011-08-16 LAB — BASIC METABOLIC PANEL
BUN: 15 mg/dL (ref 6–23)
CO2: 27 mEq/L (ref 19–32)
Calcium: 10.2 mg/dL (ref 8.4–10.5)
Chloride: 98 mEq/L (ref 96–112)
Creatinine, Ser: 0.63 mg/dL (ref 0.50–1.35)
GFR calc Af Amer: >90 mL/min (ref >90)
GFR calc non Af Amer: >90 mL/min (ref >90)
Glucose, Bld: 300 mg/dL — ABNORMAL HIGH (ref 70–99)
Potassium: 4.3 mEq/L (ref 3.5–5.1)
Sodium: 137 mEq/L (ref 135–145)

## 2011-08-16 LAB — CBC
HCT: 46.2 % (ref 39.0–52.0)
Hemoglobin: 15.9 g/dL (ref 13.0–17.0)
MCH: 29.9 pg (ref 26.0–34.0)
MCHC: 34.4 g/dL (ref 30.0–36.0)
MCV: 87 fL (ref 78.0–100.0)
Platelets: 289 10*3/uL (ref 150–400)
RBC: 5.31 MIL/uL (ref 4.22–5.81)
RDW: 12.8 % (ref 11.5–15.5)
WBC: 9.6 10*3/uL (ref 4.0–10.5)

## 2011-08-16 LAB — SURGICAL PCR SCREEN
MRSA, PCR: NEGATIVE
Staphylococcus aureus: NEGATIVE

## 2011-08-17 MED ORDER — CEFAZOLIN SODIUM-DEXTROSE 2-3 GM-% IV SOLR
2.0000 g | INTRAVENOUS | Status: AC
  Administered 2011-08-18: 2 g via INTRAVENOUS
  Filled 2011-08-17: qty 50

## 2011-08-17 NOTE — Consult Note (Signed)
Anesthesia:  Patient is a 56 year old male scheduled for a 3 level ACDF on 08/18/11.  His history is significant for DM2, HTN, HLD, GERD, depression, HH, scoliosis, melanoma.  Dr. Abran Cantor is his PCP.  No Vitals were recorded at his PAT appointment.      His EKG shows NSR, possible LAE, non-specific lateral T wave abnormality.  This was confirmed by the interpreting Cardiologist Dr. Myrtis Ser, although he is not actually followed by a Cardiologist.  Currently there are no comparison EKGs.  He denies CP, SOB.  He says he can walk up a flight of stair without difficulty.  He has never had a stress or echo done.  CXR shows scoliosis, but no active cardiopulmonary process.  Labs noted.  His glucose is elevated at 300.  His AIC was 7.6 in May of 2012.  He does not check his glucose levels at home.  He is compliant with his medications.  I left a message for Graciella Belton at Dr. Franky Macho.  Will plan to check his glucose on arrival.

## 2011-08-18 ENCOUNTER — Inpatient Hospital Stay (HOSPITAL_COMMUNITY)
Admission: RE | Admit: 2011-08-18 | Discharge: 2011-08-23 | DRG: 473 | Disposition: A | Discharge status: Home Health | Payer: Worker's Compensation | Source: Ambulatory Visit | Attending: Neurosurgery | Admitting: Neurosurgery

## 2011-08-18 ENCOUNTER — Encounter (HOSPITAL_COMMUNITY)
Admission: RE | Disposition: A | Discharge status: Home Health | Payer: Self-pay | Source: Ambulatory Visit | Attending: Neurosurgery

## 2011-08-18 ENCOUNTER — Encounter (HOSPITAL_COMMUNITY): Payer: Self-pay | Admitting: Vascular Surgery

## 2011-08-18 ENCOUNTER — Ambulatory Visit (HOSPITAL_COMMUNITY): Payer: Worker's Compensation

## 2011-08-18 ENCOUNTER — Ambulatory Visit (HOSPITAL_COMMUNITY): Payer: Worker's Compensation | Admitting: Vascular Surgery

## 2011-08-18 DIAGNOSIS — M4712 Other spondylosis with myelopathy, cervical region: Principal | ICD-10-CM | POA: Diagnosis present

## 2011-08-18 DIAGNOSIS — E119 Type 2 diabetes mellitus without complications: Secondary | ICD-10-CM | POA: Diagnosis present

## 2011-08-18 DIAGNOSIS — I10 Essential (primary) hypertension: Secondary | ICD-10-CM | POA: Diagnosis present

## 2011-08-18 DIAGNOSIS — Z79899 Other long term (current) drug therapy: Secondary | ICD-10-CM

## 2011-08-18 DIAGNOSIS — Z7982 Long term (current) use of aspirin: Secondary | ICD-10-CM

## 2011-08-18 HISTORY — PX: ANTERIOR CERVICAL DECOMP/DISCECTOMY FUSION: SHX1161

## 2011-08-18 LAB — GLUCOSE, CAPILLARY
Glucose-Capillary: 244 mg/dL — ABNORMAL HIGH (ref 70–99)
Glucose-Capillary: 226 mg/dL — ABNORMAL HIGH (ref 70–99)

## 2011-08-18 SURGERY — ANTERIOR CERVICAL DECOMPRESSION/DISCECTOMY FUSION 3 LEVELS
Anesthesia: General | Site: Neck | Wound class: Clean

## 2011-08-18 MED ORDER — INSULIN ASPART 100 UNIT/ML ~~LOC~~ SOLN
7.0000 [IU] | SUBCUTANEOUS | Status: DC
  Filled 2011-08-18: qty 3

## 2011-08-18 MED ORDER — DEXAMETHASONE 4 MG PO TABS
4.0000 mg | ORAL_TABLET | Freq: Two times a day (BID) | ORAL | Status: DC
  Administered 2011-08-19 – 2011-08-23 (×9): 4 mg via ORAL
  Filled 2011-08-18 (×11): qty 1

## 2011-08-18 MED ORDER — ZOLPIDEM TARTRATE 10 MG PO TABS
10.0000 mg | ORAL_TABLET | Freq: Every evening | ORAL | Status: DC | PRN
  Administered 2011-08-21: 10 mg via ORAL
  Filled 2011-08-18: qty 1

## 2011-08-18 MED ORDER — ACETAMINOPHEN 325 MG PO TABS: 650.0000 mg | ORAL_TABLET | ORAL | Status: DC | PRN

## 2011-08-18 MED ORDER — OXYCODONE-ACETAMINOPHEN 5-325 MG PO TABS
1.0000 | ORAL_TABLET | ORAL | Status: DC | PRN
  Administered 2011-08-19: 2 via ORAL
  Administered 2011-08-19: 1 via ORAL
  Administered 2011-08-20 – 2011-08-21 (×2): 2 via ORAL
  Administered 2011-08-21 – 2011-08-23 (×3): 1 via ORAL
  Administered 2011-08-23: 2 via ORAL
  Filled 2011-08-18 (×5): qty 1
  Filled 2011-08-18 (×4): qty 2

## 2011-08-18 MED ORDER — HEMOSTATIC AGENTS (NO CHARGE) OPTIME
TOPICAL | Status: DC | PRN
  Administered 2011-08-18: 1 via TOPICAL

## 2011-08-18 MED ORDER — ROSUVASTATIN CALCIUM 10 MG PO TABS
10.0000 mg | ORAL_TABLET | Freq: Every day | ORAL | Status: DC
  Administered 2011-08-18 – 2011-08-23 (×6): 10 mg via ORAL
  Filled 2011-08-18 (×6): qty 1

## 2011-08-18 MED ORDER — INSULIN ASPART 100 UNIT/ML ~~LOC~~ SOLN
0.0000 [IU] | Freq: Every day | SUBCUTANEOUS | Status: DC
  Administered 2011-08-19 – 2011-08-22 (×4): 3 [IU] via SUBCUTANEOUS

## 2011-08-18 MED ORDER — LACTATED RINGERS IV SOLN
INTRAVENOUS | Status: DC | PRN
  Administered 2011-08-18 (×4): via INTRAVENOUS

## 2011-08-18 MED ORDER — ACETAMINOPHEN 650 MG RE SUPP: 650.0000 mg | RECTAL | Status: DC | PRN

## 2011-08-18 MED ORDER — SODIUM CHLORIDE 0.9 % IV SOLN: 250.0000 mL | INTRAVENOUS | Status: DC

## 2011-08-18 MED ORDER — EPHEDRINE SULFATE 50 MG/ML IJ SOLN
INTRAMUSCULAR | Status: DC | PRN
  Administered 2011-08-18: 5 mg via INTRAVENOUS
  Administered 2011-08-18: 10 mg via INTRAVENOUS
  Administered 2011-08-18 (×2): 5 mg via INTRAVENOUS

## 2011-08-18 MED ORDER — VANCOMYCIN HCL IN DEXTROSE 1-5 GM/200ML-% IV SOLN
INTRAVENOUS | Status: AC
  Filled 2011-08-18: qty 200

## 2011-08-18 MED ORDER — PANTOPRAZOLE SODIUM 40 MG PO TBEC
40.0000 mg | DELAYED_RELEASE_TABLET | Freq: Every day | ORAL | Status: DC
  Administered 2011-08-19 – 2011-08-23 (×4): 40 mg via ORAL
  Filled 2011-08-18 (×5): qty 1

## 2011-08-18 MED ORDER — PHENOL 1.4 % MT LIQD
1.0000 | OROMUCOSAL | Status: DC | PRN
  Filled 2011-08-18: qty 177

## 2011-08-18 MED ORDER — AMLODIPINE BESY-BENAZEPRIL HCL 10-20 MG PO CAPS: 1.0000 | ORAL_CAPSULE | Freq: Every day | ORAL | Status: DC

## 2011-08-18 MED ORDER — SODIUM CHLORIDE 0.9 % IJ SOLN
3.0000 mL | Freq: Two times a day (BID) | INTRAMUSCULAR | Status: DC
  Administered 2011-08-19 – 2011-08-23 (×9): 3 mL via INTRAVENOUS

## 2011-08-18 MED ORDER — 0.9 % SODIUM CHLORIDE (POUR BTL) OPTIME
TOPICAL | Status: DC | PRN
  Administered 2011-08-18: 1000 mL

## 2011-08-18 MED ORDER — THROMBIN 5000 UNITS EX SOLR
OROMUCOSAL | Status: DC | PRN
  Administered 2011-08-18: 19:00:00 via TOPICAL

## 2011-08-18 MED ORDER — METFORMIN HCL 500 MG PO TABS
500.0000 mg | ORAL_TABLET | Freq: Two times a day (BID) | ORAL | Status: DC
  Administered 2011-08-19 – 2011-08-23 (×10): 500 mg via ORAL
  Filled 2011-08-18 (×11): qty 1

## 2011-08-18 MED ORDER — INSULIN ASPART 100 UNIT/ML ~~LOC~~ SOLN
7.0000 [IU] | SUBCUTANEOUS | Status: AC
  Administered 2011-08-18: 5 [IU] via SUBCUTANEOUS
  Filled 2011-08-18: qty 0.07

## 2011-08-18 MED ORDER — MENTHOL 3 MG MT LOZG
1.0000 | LOZENGE | OROMUCOSAL | Status: DC | PRN
  Filled 2011-08-18: qty 9

## 2011-08-18 MED ORDER — DIAZEPAM 5 MG PO TABS
5.0000 mg | ORAL_TABLET | Freq: Four times a day (QID) | ORAL | Status: DC | PRN
  Administered 2011-08-19 – 2011-08-21 (×2): 5 mg via ORAL
  Filled 2011-08-18 (×2): qty 1

## 2011-08-18 MED ORDER — NEOSTIGMINE METHYLSULFATE 1 MG/ML IJ SOLN
INTRAMUSCULAR | Status: DC | PRN
  Administered 2011-08-18: 4 mg via INTRAVENOUS

## 2011-08-18 MED ORDER — PHENYLEPHRINE HCL 10 MG/ML IJ SOLN
INTRAMUSCULAR | Status: DC | PRN
  Administered 2011-08-18 (×2): 80 ug via INTRAVENOUS

## 2011-08-18 MED ORDER — THROMBIN 20000 UNITS EX KIT
PACK | CUTANEOUS | Status: DC | PRN
  Administered 2011-08-18: 20000 [IU] via TOPICAL

## 2011-08-18 MED ORDER — ONDANSETRON HCL 4 MG/2ML IJ SOLN
INTRAMUSCULAR | Status: DC | PRN
  Administered 2011-08-18: 4 mg via INTRAVENOUS

## 2011-08-18 MED ORDER — POTASSIUM CHLORIDE IN NACL 20-0.9 MEQ/L-% IV SOLN
INTRAVENOUS | Status: DC
  Administered 2011-08-18: 01:00:00 via INTRAVENOUS
  Filled 2011-08-18 (×2): qty 1000

## 2011-08-18 MED ORDER — AMLODIPINE BESYLATE 10 MG PO TABS
10.0000 mg | ORAL_TABLET | Freq: Every day | ORAL | Status: DC
  Administered 2011-08-19 – 2011-08-23 (×4): 10 mg via ORAL
  Filled 2011-08-18 (×5): qty 1

## 2011-08-18 MED ORDER — GLIPIZIDE-METFORMIN HCL 5-500 MG PO TABS: 1.0000 | ORAL_TABLET | Freq: Two times a day (BID) | ORAL | Status: DC

## 2011-08-18 MED ORDER — SODIUM CHLORIDE 0.9 % IJ SOLN: 3.0000 mL | INTRAMUSCULAR | Status: DC | PRN

## 2011-08-18 MED ORDER — MIDAZOLAM HCL 5 MG/5ML IJ SOLN
INTRAMUSCULAR | Status: DC | PRN
  Administered 2011-08-18: 2 mg via INTRAVENOUS

## 2011-08-18 MED ORDER — ONDANSETRON HCL 4 MG/2ML IJ SOLN: 4.0000 mg | Freq: Once | INTRAMUSCULAR | Status: DC | PRN

## 2011-08-18 MED ORDER — LINAGLIPTIN 5 MG PO TABS
5.0000 mg | ORAL_TABLET | Freq: Every day | ORAL | Status: DC
  Administered 2011-08-19 – 2011-08-23 (×5): 5 mg via ORAL
  Filled 2011-08-18 (×5): qty 1

## 2011-08-18 MED ORDER — DEXAMETHASONE SODIUM PHOSPHATE 4 MG/ML IJ SOLN
INTRAMUSCULAR | Status: DC | PRN
  Administered 2011-08-18: 10 mg via INTRAVENOUS

## 2011-08-18 MED ORDER — HYDROMORPHONE HCL PF 1 MG/ML IJ SOLN
0.2500 mg | INTRAMUSCULAR | Status: DC | PRN
  Administered 2011-08-18: 0.5 mg via INTRAVENOUS

## 2011-08-18 MED ORDER — ARTIFICIAL TEARS OP OINT
TOPICAL_OINTMENT | OPHTHALMIC | Status: DC | PRN
  Administered 2011-08-18: 1 via OPHTHALMIC

## 2011-08-18 MED ORDER — DEXAMETHASONE SODIUM PHOSPHATE 4 MG/ML IJ SOLN
4.0000 mg | Freq: Two times a day (BID) | INTRAMUSCULAR | Status: DC
  Administered 2011-08-21: 4 mg via INTRAVENOUS
  Filled 2011-08-18 (×11): qty 1

## 2011-08-18 MED ORDER — GABAPENTIN 800 MG PO TABS
800.0000 mg | ORAL_TABLET | Freq: Three times a day (TID) | ORAL | Status: DC
  Administered 2011-08-18 – 2011-08-19 (×4): 800 mg via ORAL
  Filled 2011-08-18 (×7): qty 1

## 2011-08-18 MED ORDER — INSULIN ASPART 100 UNIT/ML ~~LOC~~ SOLN
0.0000 [IU] | Freq: Three times a day (TID) | SUBCUTANEOUS | Status: DC
  Administered 2011-08-19 (×2): 15 [IU] via SUBCUTANEOUS
  Administered 2011-08-19: 4 [IU] via SUBCUTANEOUS
  Administered 2011-08-20: 20 [IU] via SUBCUTANEOUS
  Administered 2011-08-20 (×2): 15 [IU] via SUBCUTANEOUS
  Administered 2011-08-21 (×2): 11 [IU] via SUBCUTANEOUS
  Administered 2011-08-21: 7 [IU] via SUBCUTANEOUS
  Administered 2011-08-22 (×2): 15 [IU] via SUBCUTANEOUS
  Administered 2011-08-22: 7 [IU] via SUBCUTANEOUS
  Administered 2011-08-23: 11 [IU] via SUBCUTANEOUS
  Administered 2011-08-23 (×2): 15 [IU] via SUBCUTANEOUS
  Filled 2011-08-18: qty 3

## 2011-08-18 MED ORDER — ROCURONIUM BROMIDE 100 MG/10ML IV SOLN
INTRAVENOUS | Status: DC | PRN
  Administered 2011-08-18: 50 mg via INTRAVENOUS

## 2011-08-18 MED ORDER — DULOXETINE HCL 60 MG PO CPEP
60.0000 mg | ORAL_CAPSULE | Freq: Every day | ORAL | Status: DC
  Administered 2011-08-19 – 2011-08-23 (×5): 60 mg via ORAL
  Filled 2011-08-18 (×5): qty 1

## 2011-08-18 MED ORDER — INSULIN ASPART 100 UNIT/ML ~~LOC~~ SOLN
SUBCUTANEOUS | Status: AC
  Administered 2011-08-18: 7 [IU]
  Filled 2011-08-18: qty 1

## 2011-08-18 MED ORDER — GLYCOPYRROLATE 0.2 MG/ML IJ SOLN
INTRAMUSCULAR | Status: DC | PRN
  Administered 2011-08-18: .8 mg via INTRAVENOUS

## 2011-08-18 MED ORDER — PROPOFOL 10 MG/ML IV EMUL
INTRAVENOUS | Status: DC | PRN
  Administered 2011-08-18: 180 mg via INTRAVENOUS

## 2011-08-18 MED ORDER — HETASTARCH-ELECTROLYTES 6 % IV SOLN
INTRAVENOUS | Status: DC | PRN
  Administered 2011-08-18: 15:00:00 via INTRAVENOUS

## 2011-08-18 MED ORDER — MORPHINE SULFATE 4 MG/ML IJ SOLN: 1.0000 mg | INTRAMUSCULAR | Status: DC | PRN

## 2011-08-18 MED ORDER — FENTANYL CITRATE 0.05 MG/ML IJ SOLN
INTRAMUSCULAR | Status: DC | PRN
  Administered 2011-08-18: 150 ug via INTRAVENOUS
  Administered 2011-08-18 (×2): 50 ug via INTRAVENOUS
  Administered 2011-08-18: 100 ug via INTRAVENOUS
  Administered 2011-08-18: 50 ug via INTRAVENOUS
  Administered 2011-08-18: 100 ug via INTRAVENOUS
  Administered 2011-08-18: 50 ug via INTRAVENOUS

## 2011-08-18 MED ORDER — GLIPIZIDE 5 MG PO TABS
5.0000 mg | ORAL_TABLET | Freq: Two times a day (BID) | ORAL | Status: DC
  Administered 2011-08-19 – 2011-08-23 (×10): 5 mg via ORAL
  Filled 2011-08-18 (×11): qty 1

## 2011-08-18 MED ORDER — LIDOCAINE-EPINEPHRINE 0.5-1:200000 % IJ SOLN
INTRAMUSCULAR | Status: DC | PRN
  Administered 2011-08-18: 5 mL

## 2011-08-18 MED ORDER — CEFAZOLIN SODIUM-DEXTROSE 2-3 GM-% IV SOLR
2.0000 g | Freq: Three times a day (TID) | INTRAVENOUS | Status: AC
  Administered 2011-08-19 (×2): 2 g via INTRAVENOUS
  Filled 2011-08-18 (×2): qty 50

## 2011-08-18 MED ORDER — HYDROCODONE-ACETAMINOPHEN 5-325 MG PO TABS: 1.0000 | ORAL_TABLET | ORAL | Status: DC | PRN

## 2011-08-18 MED ORDER — HYDROMORPHONE HCL PF 1 MG/ML IJ SOLN
INTRAMUSCULAR | Status: AC
  Filled 2011-08-18: qty 1

## 2011-08-18 MED ORDER — LIDOCAINE HCL 4 % MT SOLN
OROMUCOSAL | Status: DC | PRN
  Administered 2011-08-18: 4 mL via TOPICAL

## 2011-08-18 MED ORDER — ONDANSETRON HCL 4 MG/2ML IJ SOLN: 4.0000 mg | INTRAMUSCULAR | Status: DC | PRN

## 2011-08-18 MED ORDER — VECURONIUM BROMIDE 10 MG IV SOLR
INTRAVENOUS | Status: DC | PRN
  Administered 2011-08-18: 10 mg via INTRAVENOUS
  Administered 2011-08-18: 1 mg via INTRAVENOUS
  Administered 2011-08-18: 5 mg via INTRAVENOUS

## 2011-08-18 MED ORDER — SIMVASTATIN 40 MG PO TABS: 40.0000 mg | ORAL_TABLET | Freq: Every day | ORAL | Status: DC

## 2011-08-18 MED ORDER — BENAZEPRIL HCL 20 MG PO TABS
20.0000 mg | ORAL_TABLET | Freq: Every day | ORAL | Status: DC
  Administered 2011-08-19 – 2011-08-23 (×4): 20 mg via ORAL
  Filled 2011-08-18 (×5): qty 1

## 2011-08-18 SURGICAL SUPPLY — 87 items
18mm Self--Tapping Variable Angle Screw (Screw) ×1 IMPLANT
ADH SKN CLS APL DERMABOND .7 (GAUZE/BANDAGES/DRESSINGS) ×1
ALLOGRAFT TRIAD LORDOTIC CC (Bone Implant) ×1 IMPLANT
BANDAGE GAUZE ELAST BULKY 4 IN (GAUZE/BANDAGES/DRESSINGS) ×2 IMPLANT
BIT DRILL NEURO 2X3.1 SFT TUCH (MISCELLANEOUS) ×1 IMPLANT
BLADE SURG ROTATE 9660 (MISCELLANEOUS) ×1 IMPLANT
BUR DRUM 4.0 (BURR) ×1 IMPLANT
CANISTER SUCTION 2500CC (MISCELLANEOUS) ×2 IMPLANT
CLOTH BEACON ORANGE TIMEOUT ST (SAFETY) ×2 IMPLANT
CONT SPEC 4OZ CLIKSEAL STRL BL (MISCELLANEOUS) ×2 IMPLANT
DECANTER SPIKE VIAL GLASS SM (MISCELLANEOUS) ×2 IMPLANT
DERMABOND ADVANCED (GAUZE/BANDAGES/DRESSINGS) ×1
DERMABOND ADVANCED .7 DNX12 (GAUZE/BANDAGES/DRESSINGS) ×1 IMPLANT
DRAPE LAPAROTOMY 100X72 PEDS (DRAPES) ×2 IMPLANT
DRAPE MICROSCOPE LEICA (MISCELLANEOUS) ×2 IMPLANT
DRAPE POUCH INSTRU U-SHP 10X18 (DRAPES) ×2 IMPLANT
DRAPE PROXIMA HALF (DRAPES) ×2 IMPLANT
DRILL NEURO 2X3.1 SOFT TOUCH (MISCELLANEOUS) ×2
DURAPREP 6ML APPLICATOR 50/CS (WOUND CARE) ×2 IMPLANT
ELECT COATED BLADE 2.86 ST (ELECTRODE) ×2 IMPLANT
ELECT REM PT RETURN 9FT ADLT (ELECTROSURGICAL) ×2
ELECTRODE REM PT RTRN 9FT ADLT (ELECTROSURGICAL) ×1 IMPLANT
GAUZE SPONGE 4X4 16PLY XRAY LF (GAUZE/BANDAGES/DRESSINGS) IMPLANT
GLOVE BIO SURGEON STRL SZ 6.5 (GLOVE) IMPLANT
GLOVE BIO SURGEON STRL SZ7 (GLOVE) ×4 IMPLANT
GLOVE BIO SURGEON STRL SZ7.5 (GLOVE) IMPLANT
GLOVE BIO SURGEON STRL SZ8 (GLOVE) IMPLANT
GLOVE BIO SURGEON STRL SZ8.5 (GLOVE) IMPLANT
GLOVE BIOGEL M 8.0 STRL (GLOVE) IMPLANT
GLOVE BIOGEL PI IND STRL 7.0 (GLOVE) IMPLANT
GLOVE BIOGEL PI IND STRL 7.5 (GLOVE) IMPLANT
GLOVE BIOGEL PI IND STRL 8 (GLOVE) IMPLANT
GLOVE BIOGEL PI INDICATOR 7.0 (GLOVE) ×3
GLOVE BIOGEL PI INDICATOR 7.5 (GLOVE) ×1
GLOVE BIOGEL PI INDICATOR 8 (GLOVE) ×2
GLOVE ECLIPSE 6.5 STRL STRAW (GLOVE) ×3 IMPLANT
GLOVE ECLIPSE 7.0 STRL STRAW (GLOVE) IMPLANT
GLOVE ECLIPSE 7.5 STRL STRAW (GLOVE) ×4 IMPLANT
GLOVE ECLIPSE 8.0 STRL XLNG CF (GLOVE) IMPLANT
GLOVE ECLIPSE 8.5 STRL (GLOVE) IMPLANT
GLOVE EXAM NITRILE LRG STRL (GLOVE) IMPLANT
GLOVE EXAM NITRILE MD LF STRL (GLOVE) ×1 IMPLANT
GLOVE EXAM NITRILE XL STR (GLOVE) IMPLANT
GLOVE EXAM NITRILE XS STR PU (GLOVE) IMPLANT
GLOVE INDICATOR 6.5 STRL GRN (GLOVE) IMPLANT
GLOVE INDICATOR 7.0 STRL GRN (GLOVE) IMPLANT
GLOVE INDICATOR 7.5 STRL GRN (GLOVE) IMPLANT
GLOVE INDICATOR 8.0 STRL GRN (GLOVE) IMPLANT
GLOVE INDICATOR 8.5 STRL (GLOVE) IMPLANT
GLOVE OPTIFIT SS 8.0 STRL (GLOVE) IMPLANT
GLOVE SURG SS PI 6.5 STRL IVOR (GLOVE) IMPLANT
GOWN BRE IMP SLV AUR LG STRL (GOWN DISPOSABLE) ×5 IMPLANT
GOWN BRE IMP SLV AUR XL STRL (GOWN DISPOSABLE) IMPLANT
GOWN STRL REIN 2XL LVL4 (GOWN DISPOSABLE) ×2 IMPLANT
HEAD HALTER (SOFTGOODS) ×2 IMPLANT
HEMOSTAT POWDER KIT SURGIFOAM (HEMOSTASIS) ×1 IMPLANT
HEMOSTAT SURGICEL 2X14 (HEMOSTASIS) IMPLANT
KIT BASIN OR (CUSTOM PROCEDURE TRAY) ×2 IMPLANT
KIT ROOM TURNOVER OR (KITS) ×2 IMPLANT
NDL HYPO 25X1 1.5 SAFETY (NEEDLE) ×1 IMPLANT
NDL SPNL 22GX3.5 QUINCKE BK (NEEDLE) ×1 IMPLANT
NEEDLE HYPO 25X1 1.5 SAFETY (NEEDLE) ×2 IMPLANT
NEEDLE SPNL 22GX3.5 QUINCKE BK (NEEDLE) ×2 IMPLANT
NS IRRIG 1000ML POUR BTL (IV SOLUTION) ×2 IMPLANT
PACK LAMINECTOMY NEURO (CUSTOM PROCEDURE TRAY) ×2 IMPLANT
PAD ARMBOARD 7.5X6 YLW CONV (MISCELLANEOUS) ×3 IMPLANT
PATTIES SURGICAL .5 X.5 (GAUZE/BANDAGES/DRESSINGS) ×1 IMPLANT
PIN DISTRACTION 14MM (PIN) ×4 IMPLANT
PLATE 51 BND GRV PREBNT 60X16 (Plate) IMPLANT
PLATE 51MM (Plate) ×2 IMPLANT
RUBBERBAND STERILE (MISCELLANEOUS) ×4 IMPLANT
SCREW 18MM (Screw) ×2 IMPLANT
SCREW 4.0X16MM (Screw) ×1 IMPLANT
SCREW SELF TAP 4.0X14MM (Screw) ×4 IMPLANT
SCREW SELF TAP RESCUE 14MM (Screw) ×1 IMPLANT
SPACER ACF PARALLEL 7MM (Bone Implant) ×1 IMPLANT
SPACER PARALLEL 6MM CC ACF (Bone Implant) ×1 IMPLANT
SPONGE INTESTINAL PEANUT (DISPOSABLE) ×3 IMPLANT
SPONGE SURGIFOAM ABS GEL 100 (HEMOSTASIS) ×1 IMPLANT
SUT VIC AB 0 CT1 27 (SUTURE) ×2
SUT VIC AB 0 CT1 27XBRD ANTBC (SUTURE) ×1 IMPLANT
SUT VIC AB 3-0 SH 8-18 (SUTURE) ×3 IMPLANT
SYR 20ML ECCENTRIC (SYRINGE) ×2 IMPLANT
TOWEL OR 17X24 6PK STRL BLUE (TOWEL DISPOSABLE) ×2 IMPLANT
TOWEL OR 17X26 10 PK STRL BLUE (TOWEL DISPOSABLE) ×2 IMPLANT
TRAY FOLEY CATH 14FRSI W/METER (CATHETERS) ×1 IMPLANT
WATER STERILE IRR 1000ML POUR (IV SOLUTION) ×2 IMPLANT

## 2011-08-18 NOTE — Progress Notes (Signed)
Dr. Franky Macho paged to bedside.  Patient waking, was able to extubate to mask then to nasal canula.  Patient unable to follow commands to squeeze hands or to move feet, patient was unable to flex or extend feet. Dr. Franky Macho at bedside.  Patient understands to follow commands but is physically unable to do so.  Patient does not move feet to pain.  Dr. Franky Macho aware of all findings, as is Dr. Michelle Piper.  Monitoring patient very closely.  VS otherwise stable,and patient is on 

## 2011-08-18 NOTE — Transfer of Care (Signed)
Immediate Anesthesia Transfer of Care Note  Patient: Colton Bush  Procedure(s) Performed:  ANTERIOR CERVICAL DECOMPRESSION/DISCECTOMY FUSION 3 LEVELS - Anterior Cervical Four-Five/Five-Six/Six-Seven Decompression with Fusion, Plating, and Bonegraft  Patient Location: PACU  Anesthesia Type: General  Level of Consciousness: responds to stimulation  Airway & Oxygen Therapy: Patient connected to T-piece oxygen  Post-op Assessment: Report given to PACU RN and Post -op Vital signs reviewed and stable  Post vital signs: Reviewed and stable Filed Vitals:   08/18/11 1955  BP:   Pulse:   Temp: 36.8 C  Resp:     Complications: No apparent anesthesia complications

## 2011-08-18 NOTE — Preoperative (Signed)
Beta Blockers   Reason not to administer Beta Blockers:Not Applicable 

## 2011-08-18 NOTE — Anesthesia Preprocedure Evaluation (Addendum)
Anesthesia Evaluation  Patient identified by MRN, date of birth, ID band Patient awake    Reviewed: Allergy & Precautions, H&P , NPO status , Patient's Chart, lab work & pertinent test results  History of Anesthesia Complications Negative for: history of anesthetic complications  Airway Mallampati: II  Neck ROM: Full    Dental  (+) Edentulous Upper, Partial Lower and Dental Advisory Given   Pulmonary neg pulmonary ROS,  clear to auscultation        Cardiovascular Exercise Tolerance: Good hypertension, Pt. on medications Regular Normal    Neuro/Psych PSYCHIATRIC DISORDERS Depression  Neuromuscular disease (neck pain with sensitivity issues with BUE)    GI/Hepatic Neg liver ROS, hiatal hernia, GERD-  Medicated and Controlled,  Endo/Other  Diabetes mellitus-, Type 2, Oral Hypoglycemic Agents and Insulin Dependent  Renal/GU negative Renal ROS  Genitourinary negative   Musculoskeletal  (+) Arthritis -, Osteoarthritis,    Abdominal (+) obese,   Peds negative pediatric ROS (+)  Hematology negative hematology ROS (+)   Anesthesia Other Findings   Reproductive/Obstetrics negative OB ROS                         Anesthesia Physical Anesthesia Plan  ASA: III  Anesthesia Plan: General   Post-op Pain Management:    Induction: Intravenous  Airway Management Planned: Oral ETT  Additional Equipment:   Intra-op Plan:   Post-operative Plan: Extubation in OR  Informed Consent: I have reviewed the patients History and Physical, chart, labs and discussed the procedure including the risks, benefits and alternatives for the proposed anesthesia with the patient or authorized representative who has indicated his/her understanding and acceptance.   Dental advisory given  Plan Discussed with: CRNA and Surgeon  Anesthesia Plan Comments: (Type 2 DM glucose 244 Htn GERD )       Anesthesia Quick  Evaluation

## 2011-08-18 NOTE — H&P (Signed)
BP 163/81  Pulse 71  Temp(Src) 97.5 F (36.4 C) (Oral)  Resp 20  Ht 5\' 11"  (1.803 m)  Wt 108.41 kg (239 lb)  BMI 33.33 kg/m2  SpO2 95%  HISTORY:     Colton Bush is a 56 year old gentleman who fell from standing hitting his head on a metal rack then falling to the floor.  According to notes and the patient, he was admitted to the hospital on 10/26/2010 at [facility].  The neurosurgeon who has been taking care of him, Dr. Tye Maryland, has stated that he sustained a central cord injury consisting of left upper extremity hypersensitivity, pain, weakness, and difficulty with his gait.  He was an inpatient at [facility] for two months.  While at [facility], he was confined to a wheelchair. He is now walking without any appliances and doing well.  His left side still remains more problematic than the right.  He is weaker in his left upper extremity than he is on the right side.    His MRI performed the night of his admission revealed cord change at C4-5 due to a broad-based disc.  He had a large right-sided disc at C5-6 and at 6-7 he had a large disc herniation with cord compression.  It was recommended by Dr. Selena Batten that he undergo an anterior cervical decompression and arthrodesis from C4 to C7.  It was expected that he would undergo this operation.  However, it was cancelled three times by [employer].  He comes to me today for a second opinion.  Colton Bush states that he does not feel he has any problems with walking currently.  His wife states he leans to his right side when he walks and that his balance is not that good.  She reports she had to help him get dressed for quite some time after his injury.  Colton Bush does not think he has a problem at this point.  He still does complain of weakness in his left upper extremity.   PAST MEDICAL HISTORY:  Significant for hypertension and diabetes.   PAST SURGICAL HISTORY:  No previous  operations.  ALLERGIES:     No known drug allergies.   SOCIAL HISTORY:    He does not smoke, does not use alcohol and does not use illicit drugs. He is 5' 9 " tall, 241 pounds.       REVIEW OF SYSTEMS:   Positive for hypertension, arm pain, neck pain, and diabetes.  He denies constitutional, eyes, ears, nose, throat, mouth, respiratory, GI, GU, skin, neurological, psychiatric, hematologic, and allergic problems.  MEDICATIONS:    Gabapentin, Glyburide, Metformin, Amlodipine, Benazepril, Cymbalta, Simvastatin, and Percocet.  PHYSICAL EXAMINATION:  On examination, he is alert and oriented x four and answering all questions appropriately. Memory, language, attention span and fund of knowledge are normal.  He is well kempt and in no distress.  He has 5/5 strength in the lower extremities.  He has weakness in his left grip, weakness in his right grip, weakness in the left biceps and triceps.  Essentially normal strength on the right side.  He does have clonus at the left ankle and none on the right.  He does have Hoffmann's sign on the left and not on the right.  Proprioception is intact both sides. Light touch is intact both sides. No cervical masses or bruits.  Lungs are clear. Heart: regular rate and rhythm.  No murmurs or rubs.  Pulses are good at the  wrists bilaterally.   A new Mri was performed. What it shows is that he has significant pathology behind C4-5 where he has altered cord signal, a large disc osteophyte on the right side at C5-6 and canal stenosis which is severe at C6-7, along with significant degenerative changes at the disc space.  The cord signal is on the left side at the 4-5 level. This does explain his findings on exam.    He does need a 3-level ACDF at 4-5, 5-6, and 6-7.  He also needs a new MRI scan since the one that we have is now 62 months old.  Certainly some changes may have occurred.  We cannot rely on that old scan right now.

## 2011-08-18 NOTE — Progress Notes (Signed)
Colton Bush awoke from anesthesia quite weak, especially in the lower extremities bilaterally, and the hand intrinsics. He over time has gotten much stronger. I do not believe I need to take him to the operating room for exploration. He is moving all extremities well at this time. He will go to the ICU for post op care.

## 2011-08-18 NOTE — Op Note (Signed)
08/18/2011  7:50 PM  PATIENT:  Colton Bush  56 y.o. male  PRE-OPERATIVE DIAGNOSIS:  cervical spondylosis with myelopathy cervical stenosis C4-C7  POST-OPERATIVE DIAGNOSIS:  cervical spondylosis with myelopathy cervical stenosis C4-C7  PROCEDURE:  Procedure(s): ANTERIOR CERVICAL DECOMPRESSION C4/5, 5/6, 6/7. Arthrodesis C4/5 6mm structural allograft, C5/6 6mm structural allograft, C6/7 7mm structural allograft Anterior instrumentation Vectra plate(synthes)  SURGEON:  Surgeon(s): Carmela Hurt, MD Clydene Fake, MD  ASSISTANTS:Hirsch  ANESTHESIA:   general  EBL:     BLOOD ADMINISTERED:none  CELL SAVER GIVEN:none  COUNT:per nursing  DRAINS: none   SPECIMEN:  No Specimen  DICTATION: [name] was brought to the operating room intubated and placed under a general anesthetic. A foley catheter was placed under sterile conditions. He was positioned with his head in slight extension on a horseshoe headrest. His neck was prepped and draped in a sterile manner. I infiltrated 1/2% lidocaine/1:200000 strength epinephrine into the neck crease overlying the cricothyroid membrane. I opened the incision with a no.10 blade and dissected down to the platysma. I opened the platysma, with Metzenbaum scissors, horizontally. I dissected in the plane superior to the platysma, and inferior to the platysma to increase my working space. With both sharp and blunt dissection I created an avascular corridor to the cervical spine. I placed a spinal needle into a disc space and confirmed my level. I reflected the longus colli muscles bilaterally with monopolar cautery. I placed self retaining retractors and proceeded with my decompression.  I opened the disc spaces at C4/5,5/6,6/7 with a 15 blade and used curettes to remove disc and soft tissue. I placed distraction pins at C6/7 and started to decompress the spinal canal at that level. I decompressed the spinal canal at C6/7 using curettes, kerrison punches,  and a high speed drill. There was a large osteophyte present on the inferior portion of the C6 vertebral body. I used the drill to remove most of it, finishing the removal with kerrison punches. I used microdissection during the decompression. I opened the posterior longitudinal ligament with a Kerrison punch, and removed it progressively with punches. The ligament was hypertrophied and certainly compressing spinal cord. I decompressed both C7 roots using the drill and Kerrison's. Having completed the decompression I prepared for the arthrodesis. I measured the space with trials and felt a 7mm structural allograft would work best.  I completed the arthrodesis at C6/7 by placing the graft. I did drill the vertebral bodies to accept the graft securely.  I removed the distraction pin at C7 and placed it into the vertebral body of C5. I distracted the disc space at C5/6 and proceeded with my decompression.  I decompressed the spinal canal at C5/6 in the same fashion as at C6/7. Again the PLL was quite thick and compressing the spinal cord. Osteophytes were handled identically with the drill and punches. The C6 roots were aggressively decompressed.  I used the drill to prepare the C5 and C6 corpi to accept the structural allograft. In this instance I used a 6mm graft with fit well into the now empty disc space. I removed the distraction pin at C6 and placed it into the C4 vertebral body. Once more I decompressed the spinal canal and both C5 roots with a combination of curettes, drill, kerrison punches and pituitary rongeurs. The uncovertebral joints were quite hypertrophied at this level. They were taken down with the drill and Kerrison punches. Dr. Phoebe Perch assisted in the decompression. Once again I measured the disc  space and decided to place a 6mm graft (Helos at this level only) to complete the arthrodesis.  We placed a vectra plated from C4-C7. The initial film showed the C4 screws in the graft. They were both  repositioned and final film showed the screws, grafts, and plate to be in good position. I did not place a right sided screw at C5 because the bone fractured into the distraction pin site. I irrigated the wound and obtained hemostasis. I closed the wound in layers approximating the platysma, then the subcuticular layer with vicryl sutures. I applied dermabond for a sterile dressing.     PLAN OF CARE: Admit to inpatient   PATIENT DISPOSITION:  PACU - hemodynamically stable.   Delay start of Pharmacological VTE agent (>24hrs) due to surgical blood loss or risk of bleeding:  yes

## 2011-08-18 NOTE — Anesthesia Postprocedure Evaluation (Signed)
Anesthesia Post Note  Patient: Colton Bush  Procedure(s) Performed:  ANTERIOR CERVICAL DECOMPRESSION/DISCECTOMY FUSION 3 LEVELS - Anterior Cervical Four-Five/Five-Six/Six-Seven Decompression with Fusion, Plating, and Bonegraft  Anesthesia type: general  Patient location: PACU  Post pain: Pain level controlled  Post assessment: Patient's Cardiovascular Status Stable  Last Vitals:  Filed Vitals:   08/18/11 2205  BP:   Pulse:   Temp: 36.9 C  Resp:     Post vital signs: Reviewed and stable  Level of consciousness: sedated  Complications: No apparent anesthesia complications

## 2011-08-18 NOTE — Anesthesia Procedure Notes (Addendum)
Procedure Name: Intubation Date/Time: 08/18/2011 2:33 PM Performed by: Leona Singleton A. Patient Re-evaluated:Patient Re-evaluated prior to inductionOxygen Delivery Method: Circle System Utilized Preoxygenation: Pre-oxygenation with 100% oxygen Intubation Type: IV induction Ventilation: Mask ventilation without difficulty Laryngoscope Size: Miller and 2 Grade View: Grade I Tube type: Oral Tube size: 7.5 mm Number of attempts: 1 Airway Equipment and Method: stylet Placement Confirmation: ETT inserted through vocal cords under direct vision,  positive ETCO2 and breath sounds checked- equal and bilateral Secured at: 21 cm Tube secured with: Tape Dental Injury: Teeth and Oropharynx as per pre-operative assessment    Performed by: Molli Hazard

## 2011-08-19 LAB — GLUCOSE, CAPILLARY
Glucose-Capillary: 177 mg/dL — ABNORMAL HIGH (ref 70–99)
Glucose-Capillary: 311 mg/dL — ABNORMAL HIGH (ref 70–99)
Glucose-Capillary: 337 mg/dL — ABNORMAL HIGH (ref 70–99)
Glucose-Capillary: 265 mg/dL — ABNORMAL HIGH (ref 70–99)

## 2011-08-19 LAB — HEMOGLOBIN A1C
Hgb A1c MFr Bld: 11.3 % — ABNORMAL HIGH (ref <5.7)
Mean Plasma Glucose: 278 mg/dL — ABNORMAL HIGH (ref <117)

## 2011-08-19 NOTE — Progress Notes (Signed)
Physical Therapy Evaluation Patient Details Name: Colton Bush MRN: [id number] DOB: November 19, 1955 Today's Date: 08/19/2011  Problem List:  Patient Active Problem List  Diagnoses  . DIABETES MELLITUS, TYPE II  . HYPERLIPIDEMIA  . CERUMEN IMPACTION  . HYPERTENSION  . ESOPHAGEAL STRICTURE  . GERD  . HIATAL HERNIA  . Cervical spondylosis with myelopathy    Past Medical History:  Past Medical History  Diagnosis Date  . Hyperlipidemia     takes Zocor daily  . Hypertension     takes Amlodipine daily  . GERD (gastroesophageal reflux disease)     takes Omeprazole daily  . Diabetes mellitus type II     takes Glipizide-Metformin(Metaglip) and Januvia daily  . Depression     takes Cymbalta daily  . Bronchitis 1977  . Stiffness of hand joint     d/t cervical issues  . Neck pain     C4-7 stenosis and herniated disc  . Scoliosis     slight  . H/O hiatal hernia    Past Surgical History:  Past Surgical History  Procedure Date  . Colonoscopy may 2005,     normal, repat in 10 years  . Lymph nodes biopsy   . Melanoma rt calf 1999  . Egd with esophageal dilation 9-08    per Dr. Juanda Chance  . Tonsillectomy     as a child    PT Assessment/Plan/Recommendation PT Assessment Clinical Impression Statement: 56 yo a/p anterior cervical 4-5/5-6/6-7 decompression with fusion, plating, and bonegraft secondary h/o cervical spondylosis with myelopathy and cervical stenosis C4-C7 following a fall from standing. Pt presents with generalized weakness, decreased Lt. LE proprioception, and decreased motor control of Lt. LE with gait. Pt currently requires +2 Total assist (pt = 70-85%)  for sit to stand from low chair and with ambulation. Pt has decreased proprioception and motor control of LLE placing him at a high risk for falls.  Pt will benefit from improved motor control and strength for safe mobility. Pending length of stay and progress, pt will likely benefit from CIR consult.  PT  Recommendation/Assessment: Patient will need skilled PT in the acute care venue PT Problem List: Decreased strength;Decreased activity tolerance;Decreased balance;Decreased mobility;Decreased coordination;Decreased knowledge of use of DME PT Therapy Diagnosis : Difficulty walking;Abnormality of gait;Generalized weakness PT Plan PT Frequency: Min 6X/week PT Treatment/Interventions: Gait training;DME instruction;Stair training;Functional mobility training;Therapeutic activities;Therapeutic exercise;Balance training;Neuromuscular re-education;Patient/family education PT Recommendation Recommendations for Other Services: Rehab consult Follow Up Recommendations: Inpatient Rehab (vs. SNF pending progress) Equipment Recommended: Rolling walker with 5" wheels, other DME TBD PT Goals  Acute Rehab PT Goals PT Goal Formulation: With patient Time For Goal Achievement: 2 weeks Pt will Roll Supine to Left Side: with modified independence PT Goal: Rolling Supine to Left Side - Progress: Not met Pt will go Supine/Side to Sit: with modified independence PT Goal: Supine/Side to Sit - Progress: Not met Pt will go Sit to Supine/Side: with modified independence PT Goal: Sit to Supine/Side - Progress: Not met Pt will go Sit to Stand: with supervision PT Goal: Sit to Stand - Progress: Not met Pt will go Stand to Sit: with modified independence PT Goal: Stand to Sit - Progress: Not met Pt will Ambulate: >150 feet;with least restrictive assistive device;with supervision PT Goal: Ambulate - Progress: Not met Pt will Go Up / Down Stairs: 1-2 stairs;with least restrictive assistive device PT Goal: Up/Down Stairs - Progress: Not met  PT Evaluation Precautions/Restrictions  Precautions Precautions: Other (comment) (Cervical precautions) Precaution Comments: Pt instructed  to not flex, extend or rotate neck (pt tends to respond to questions and talk with head movements) Required Braces or Orthoses: Yes Cervical  Brace: Hard collar (All the time) Restrictions Weight Bearing Restrictions: No Prior Functioning  Home Living Lives With: Spouse Receives Help From: Family Type of Home: House Home Layout: One level Home Access: Stairs to enter Entrance Stairs-Rails: None Entrance Stairs-Number of Steps: 1 Bathroom Shower/Tub: Psychologist, counselling;Tub/shower unit Bathroom Toilet: Standard Bathroom Accessibility: Yes How Accessible: Accessible via walker (but very narrow) Home Adaptive Equipment: Bedside commode/3-in-1;Walker - rolling Prior Function Level of Independence: Independent with basic ADLs;Independent with gait (Although began to have decline prior to surgery) Driving: Yes Vocation: Workers comp Comments: Would like to return to working.  Cognition Cognition Arousal/Alertness: Awake/alert Overall Cognitive Status: Appears within functional limits for tasks assessed Orientation Level: Oriented to person;Oriented to place;Oriented to time;Oriented to situation Sensation/Coordination Sensation Light Touch: Appears Intact Proprioception: Impaired Detail Proprioception Impaired Details: Impaired LLE Additional Comments: Pt with decreased Lt. great toe proprioception, RLE WNL Coordination Gross Motor Movements are Fluid and Coordinated: No (Lt UE demonstrating decreased motor control ) Fine Motor Movements are Fluid and Coordinated: No (Pt with decreased finger fine motor control) Coordination and Movement Description: Pt with Lt. ankle clonus. With mid to late stance pt's knee goes into recurvatum  Extremity Assessment RLE Assessment RLE Assessment: Within Functional Limits (functional deficits noted with gait. ) LLE Assessment LLE Assessment: Exceptions to WFL LLE AROM (degrees) Overall AROM Left Lower Extremity: Within functional limits for tasks assessed LLE Strength LLE Overall Strength: Deficits Left Hip Flexion: 4/5 Left Knee Flexion: 3+/5 Left Knee Extension: 4/5 Left Ankle  Dorsiflexion: 4/5 (functional deficits more evident) Mobility (including Balance) Bed Mobility Bed Mobility: No (pt seated at start) Transfers Transfers: Yes Sit to Stand: 1: +2 Total assist;Other (comment);From chair/3-in-1;With upper extremity assist (pt = 70%) Sit to Stand Details (indicate cue type and reason): Verbal cues for UE placement. Assist required for weakness.  Stand to Sit: 4: Min assist;To chair/3-in-1;With upper extremity assist Stand to Sit Details: Verbal cues for UE placement and control of descent.  Ambulation/Gait Ambulation/Gait: Yes Ambulation/Gait Assistance: 1: +2 Total assist (pt = 85%) Ambulation/Gait Assistance Details (indicate cue type and reason): +2 Assist primarily for safety. Pt with Lt. knee recurvatum, difficulty clearing bil. feet but especially Lt. foot. Pt's Lt. foot has tendency to catch Rt foot during swing phase. Verbal/tactile and visual cues to increase Lt. knee control. With fatigue deficits increase.  Ambulation Distance (Feet): 65 Feet Assistive device: Rolling walker Gait Pattern: Step-through pattern;Trunk flexed;Left genu recurvatum;Decreased stance time - left;Decreased dorsiflexion - right;Decreased dorsiflexion - left  Posture/Postural Control Posture/Postural Control: No significant limitations Balance Balance Assessed: Yes Static Sitting Balance Static Sitting - Balance Support: No upper extremity supported;Feet supported Static Sitting - Level of Assistance: 5: Stand by assistance Static Standing Balance Static Standing - Balance Support: Bilateral upper extremity supported Static Standing - Level of Assistance: 5: Stand by assistance Exercise  General Exercises - Lower Extremity Ankle Circles/Pumps: AROM;Left;Seated;10 reps Long Arc Quad: AROM;Left;10 reps End of Session PT - End of Session Equipment Utilized During Treatment: Gait belt;Cervical collar Activity Tolerance: Patient tolerated treatment well;Patient limited by  fatigue Patient left: in chair;with call bell in reach;with family/visitor present Nurse Communication: Mobility status for ambulation General Behavior During Session: [facility] for tasks performed Cognition: [facility] for tasks performed  Sherie Don 08/19/2011, 10:42 AM  Sherie Don) Carleene Mains PT, DPT Acute Rehabilitation [phone]

## 2011-08-19 NOTE — Progress Notes (Signed)
Pt unable to void but complaining of need to void. Dangled at [facility] still unable to void. Full bladder palpated. 30F foley placed with patient consenting and tolerating well. Immediately after placing over of urine obtained immediatly

## 2011-08-19 NOTE — Progress Notes (Signed)
Doing well. C/o appropriate incisional soreness. No arm pain less Numbness, tingling, weakness No Nausea /vomiting   Temp:  [97.5 F (36.4 C)-99.4 F (37.4 C)] 99 F (37.2 C) (01/19 0729) Pulse Rate:  [71-119] 116  (01/19 0600) Resp:  [11-21] 17  (01/19 0600) BP: (107-163)/(63-91) 159/91 mmHg (01/19 0600) SpO2:  [92 %-97 %] 93 % (01/19 0600) Weight:  [108.3 kg (238 lb 12.1 oz)-108.41 kg (239 lb)] 108.3 kg (238 lb 12.1 oz) (01/18 2200) Good strength and sensation, slowed RAM (pt thinks he is better), no drift, no hoffmans, toes up  bilat. Incision CDI Voice ok, swallowing well  Plan: Pt doing well, increase activity - start Tx's - watch in ICU today , may T to floor in am

## 2011-08-20 LAB — GLUCOSE, CAPILLARY
Glucose-Capillary: 318 mg/dL — ABNORMAL HIGH (ref 70–99)
Glucose-Capillary: 311 mg/dL — ABNORMAL HIGH (ref 70–99)
Glucose-Capillary: 358 mg/dL — ABNORMAL HIGH (ref 70–99)

## 2011-08-20 MED ORDER — GABAPENTIN 400 MG PO CAPS
800.0000 mg | ORAL_CAPSULE | Freq: Three times a day (TID) | ORAL | Status: DC
  Administered 2011-08-20 – 2011-08-23 (×9): 800 mg via ORAL
  Filled 2011-08-20 (×8): qty 2

## 2011-08-20 NOTE — Progress Notes (Signed)
Patient c/o difficulty starting urine to flow. Foley cath d/c'd earlier today and patient has not voided since. Bladder scanned at 2100 and reads 999 ml. Dr. Yetta Barre on call for Dr. Franky Macho ordered to reinsert foley. Order carried out.

## 2011-08-20 NOTE — Progress Notes (Signed)
Physical Therapy Treatment Patient Details Name: Colton Bush MRN: [id number] DOB: 1956-03-20 Today's Date: 08/20/2011  PT Assessment/Plan  PT - Assessment/Plan Comments on Treatment Session: 56 yo a/p anterior cervical 4-5/5-6/6-7 decompression with fusion, plating, and bonegraft secondary h/o cervical spondylosis with myelopathy and cervical stenosis C4-C7 following a fall from standing. Pt progressing nicely however still not ready to go home as he requires assistance with gait and is at a high falls risk. Pt will also need to be able to ascend stairs to access home. Pt's wife works and will not be able to care for him 24 hours a day. Continue to recommend CIR for short stay to increase safety as he has good potential to be independent.  PT Plan: Discharge plan remains appropriate PT Frequency: Min 6X/week Recommendations for Other Services: Rehab consult Follow Up Recommendations: Inpatient Rehab (vs. SNF pending progress) Equipment Recommended: Rolling walker with 5" wheels PT Goals  Acute Rehab PT Goals Pt will go Sit to Stand: with supervision PT Goal: Sit to Stand - Progress: Progressing toward goal Pt will go Stand to Sit: with modified independence PT Goal: Stand to Sit - Progress: Progressing toward goal Pt will Ambulate: >150 feet;with least restrictive assistive device;with supervision PT Goal: Ambulate - Progress: Progressing toward goal Pt will Go Up / Down Stairs: 1-2 stairs;with least restrictive assistive device PT Goal: Up/Down Stairs - Progress: Progressing toward goal  PT Treatment Precautions/Restrictions  Precautions Precautions: Other (comment) (Cervical precautions) Precaution Comments: Pt instructed to not flex, extend or rotate neck (pt tends to respond to questions and talk with head movements) Required Braces or Orthoses: Yes Cervical Brace: Hard collar (All the time) Restrictions Weight Bearing Restrictions: No Mobility (including Balance) Bed  Mobility Bed Mobility: No (pt standing at start) Transfers Transfers: Yes Sit to Stand:  (standing at start) Stand to Sit: To chair/3-in-1;With upper extremity assist;5: Supervision Stand to Sit Details: Verbal cues to control descent, placement of UEs.  Ambulation/Gait Ambulation/Gait: Yes Ambulation/Gait Assistance: 4: Min assist (pt = 85%) Ambulation/Gait Assistance Details (indicate cue type and reason): Min assist for lateral sway and occasional cross over stepping. Another PT to facilitate improved dorsiflexion with Lt. heel strike and improve knee control during stance and pushoff of Lt. LE. Pt continues to struggle with recurvatum in late stance.  Ambulation Distance (Feet): 300 Feet Assistive device: Rolling walker Gait Pattern: Step-through pattern;Trunk flexed;Left genu recurvatum;Decreased dorsiflexion - right;Decreased dorsiflexion - left Stairs: No    Exercise  General Exercises - Lower Extremity Ankle Circles/Pumps: AROM;Left;Seated;10 reps Long Arc Quad: AROM;Left;10 reps End of Session PT - End of Session Equipment Utilized During Treatment: Gait belt;Cervical collar Activity Tolerance: Patient tolerated treatment well;Patient limited by fatigue Patient left: in chair;with call bell in reach;with family/visitor present Nurse Communication: Mobility status for ambulation General Behavior During Session: [facility] for tasks performed Cognition: [facility] for tasks performed  Sherie Don 08/20/2011, 8:48 AM  Sherie Don) Carleene Mains PT, DPT Acute Rehabilitation [phone]

## 2011-08-20 NOTE — Progress Notes (Signed)
Subjective: Patient hands and legs seem better - still some Left UE hypersens.  Objective: Vital signs in last 24 hours: Temp:  [97.6 F (36.4 C)-98.8 F (37.1 C)] 97.9 F (36.6 C) (01/20 0800) Pulse Rate:  [87-131] 99  (01/20 0800) Resp:  [12-23] 17  (01/20 0800) BP: (131-160)/(82-93) 141/88 mmHg (01/20 0800) SpO2:  [90 %-98 %] 95 % (01/20 0800) Weight:  [104.7 kg (230 lb 13.2 oz)] 104.7 kg (230 lb 13.2 oz) (01/20 0500)  Intake/Output from previous day: 01/19 0701 - 01/20 0700 In: 2010 [P.O.:1800; I.V.:160; IV Piggyback:50] Out: 8525 [Urine:8525] Intake/Output this shift: Total I/O In: -  Out: 325 [Urine:325]  moves all 4 well - ambulating  Lab Results: No results found for this basename: WBC:2,HGB:2,HCT:2,PLT:2 in the last 72 hours BMET No results found for this basename: NA:2,K:2,CL:2,CO2:2,GLUCOSE:2,BUN:2,CREATININE:2,CALCIUM:2 in the last 72 hours  Studies/Results: Dg Cervical Spine Complete  08/18/2011  *RADIOLOGY REPORT*  Clinical Data: C4-7 ACDF  CERVICAL SPINE - COMPLETE 4+ VIEW  Comparison: MRI cervical spine dated 08/03/2011  Findings: Intraoperative radiographs during C4-7 ACDF.  Initial radiograph demonstrates a surgical probe at C3-4.  Second radiograph demonstrates a surgical probe at C4-5.  Third and fourth radiographs demonstrate anterior cervical fixation hardware with interbody fusion at C4-5 and C5-6, incompletely visualized.  IMPRESSION: Intraoperative radiographs during C4-7 ACDF.  Original Report Authenticated By: Charline Bills, M.D.    Assessment/Plan: Will transfer to floor - continue increase activity  LOS: 2 days     Shiniqua Groseclose R, MD 08/20/2011, 8:34 AM

## 2011-08-21 LAB — GLUCOSE, CAPILLARY
Glucose-Capillary: 317 mg/dL — ABNORMAL HIGH (ref 70–99)
Glucose-Capillary: 294 mg/dL — ABNORMAL HIGH (ref 70–99)
Glucose-Capillary: 295 mg/dL — ABNORMAL HIGH (ref 70–99)
Glucose-Capillary: 224 mg/dL — ABNORMAL HIGH (ref 70–99)
Glucose-Capillary: 270 mg/dL — ABNORMAL HIGH (ref 70–99)

## 2011-08-21 MED ORDER — TAMSULOSIN HCL 0.4 MG PO CAPS
0.4000 mg | ORAL_CAPSULE | Freq: Every day | ORAL | Status: DC
  Administered 2011-08-21 – 2011-08-23 (×3): 0.4 mg via ORAL
  Filled 2011-08-21 (×3): qty 1

## 2011-08-21 NOTE — Progress Notes (Signed)
   CARE MANAGEMENT NOTE 08/21/2011  Patient:  DALESSANDRO, BALDYGA   Account Number:  [id number]  Date Initiated:  08/21/2011  Documentation initiated by:  GRAVES-BIGELOW,Cyara Devoto  Subjective/Objective Assessment:   Pt fell from standing hitting his head on a metal rack then falling to the floor.S/p anterior cervical 4-5/5-6/6-7 decompression with fusion, plating,& bonegraft secondary h/o cervical spondylosis with myelopathy and cervical stenosis C4-C7.     Action/Plan:   PT is recommending CIR vs SNF. CM spoke to CSW Maralyn Sago and she stated that she would contact CIR. PT is working with pt and recommended HH PT. Please write order for [facility] PT. Thanks   Anticipated DC Date:  08/23/2011   Anticipated DC Plan:  IP REHAB FACILITY  In-house referral  Clinical Social Worker      DC Planning Services  CM consult      Choice offered to / List presented to:          [facility] arranged  HH-2 PT  HH-3 OT      Status of service:  In process, will continue to follow Medicare Important Message given?   (If response is "NO", the following Medicare IM given date fields will be blank) Date Medicare IM given:   Date Additional Medicare IM given:    Discharge Disposition:    Per UR Regulation:    Comments:  08-21-11  [address] Tomi Bamberger, RN,BSN [phone] CM spoke to pt after he ambulated with PT. He is from home with wife and she works-unable to provide 24 hr supervision. 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 CM will call her to let her know what PT has recommended. CM had to leave a vm. Hope to get a call back soon. Will continue to f/u.  08-21-11 1515 Tomi Bamberger, RN,BSN [phone] Pt is up ambulating with PT at this time. CM will f/u with pt.

## 2011-08-21 NOTE — Progress Notes (Addendum)
Occupational Therapy Evaluation Patient Details Name: Colton Bush MRN: [id number] DOB: [birth year] Today's Date: 08/21/2011  Problem List:  Patient Active Problem List  Diagnoses  . DIABETES MELLITUS, TYPE II  . HYPERLIPIDEMIA  . CERUMEN IMPACTION  . HYPERTENSION  . ESOPHAGEAL STRICTURE  . GERD  . HIATAL HERNIA  . Cervical spondylosis with myelopathy    Past Medical History:  Past Medical History  Diagnosis Date  . Hyperlipidemia     takes Zocor daily  . Hypertension     takes Amlodipine daily  . GERD (gastroesophageal reflux disease)     takes Omeprazole daily  . Diabetes mellitus type II     takes Glipizide-Metformin(Metaglip) and Januvia daily  . Depression     takes Cymbalta daily  . Bronchitis 1977  . Stiffness of hand joint     d/t cervical issues  . Neck pain     C4-7 stenosis and herniated disc  . Scoliosis     slight  . H/O hiatal hernia    Past Surgical History:  Past Surgical History  Procedure Date  . Colonoscopy may 2005,     normal, repat in 10 years  . Lymph nodes biopsy   . Melanoma rt calf 1999  . Egd with esophageal dilation 9-08    per Dr. Juanda Chance  . Tonsillectomy     as a child    OT Assessment/Plan/Recommendation OT Assessment Clinical Impression Statement: 56 yo a/p anterior cervical 4-5/5-6/6-7 decompression with fusion, plating, and bonegraft secondary h/o cervical spondylosis with myelopathy and cervical stenosis C4-C7 following a fall from standing. Pt progressing nicely however still not ready to go home as he requires assistance with gait and is at a high falls risk. Pt will also need to be able to ascend stairs to access home. Pt's wife works and will not be able to care for him 24 hours a day. Continue to recommend CIR for short stay to increase safety as he has good potential to be independent.Recommend cont skilled OT to focus on dynamic balance with and without AD.  OT Recommendation/Assessment: Patient will need skilled OT in  the acute care venue OT Problem List: Decreased strength;Decreased coordination;Impaired balance (sitting and/or standing) OT Therapy Diagnosis : Generalized weakness OT Plan OT Frequency: Min 2X/week OT Treatment/Interventions: Self-care/ADL training;DME and/or AE instruction;Patient/family education;Therapeutic exercise;Neuromuscular education;Therapeutic activities;Balance training OT Recommendation Follow Up Recommendations: Home health OT;Inpatient Rehab (verus) Individuals Consulted Consulted and Agree with Results and Recommendations: Patient OT Goals Acute Rehab OT Goals OT Goal Formulation: With patient ADL Goals Pt Will Perform Grooming: with modified independence;Standing at sink Pt Will Perform Upper Body Bathing: with modified independence;Sit to stand from bed Pt Will Perform Lower Body Bathing: with modified independence;Sit to stand from bed Pt Will Perform Upper Body Dressing: with modified independence;Sit to stand from chair Pt Will Perform Lower Body Dressing: with modified independence;Sit to stand from chair Pt Will Transfer to Toilet: with modified independence;Ambulation;Comfort height toilet Pt Will Perform Toileting - Clothing Manipulation: with modified independence;Standing Pt Will Perform Toileting - Hygiene: with modified independence;Sit to stand from 3-in-1/toilet Pt Will Perform Tub/Shower Transfer: with supervision  OT Evaluation Precautions/Restrictions  Precautions Precautions: Fall Precaution Comments: Pt instructed no nexk flexion, extention or rotation Required Braces or Orthoses: Yes Cervical Brace: Hard collar (all the time / [place]) Restrictions Weight Bearing Restrictions: No Prior Functioning Home Living Lives With: Spouse Receives Help From: Family Type of Home: House Home Layout: One level Home Access: Stairs to enter Entrance Stairs-Rails: None  Entrance Stairs-Number of Steps: 1 Bathroom Shower/Tub: Psychologist, counselling;Tub/shower  unit Bathroom Toilet: Standard Bathroom Accessibility: Yes How Accessible: Accessible via walker Home Adaptive Equipment: Bedside commode/3-in-1;Walker - rolling   ADL ADL Eating/Feeding: Simulated;Modified independent (extra time) Grooming: Simulated Where Assessed - Grooming: Standing at sink Upper Body Bathing: Simulated;Supervision/safety Where Assessed - Upper Body Bathing: Sit to stand from chair Lower Body Bathing: Simulated;Minimal assistance Where Assessed - Lower Body Bathing: Sit to stand from chair Upper Body Dressing: Simulated;Minimal assistance Upper Body Dressing Details (indicate cue type and reason): secondary to collar Where Assessed - Upper Body Dressing: Sit to stand from chair Lower Body Dressing: Minimal assistance;Performed Lower Body Dressing Details (indicate cue type and reason): steadying A (performed able to don and doff underwear and socks with steady A for standing ) Where Assessed - Lower Body Dressing: Sit to stand from chair Toilet Transfer: Simulated Toilet Transfer Method: Ambulating (with RW) Toilet Transfer Equipment: Comfort height toilet Toileting - Clothing Manipulation: Simulated;Minimal assistance Where Assessed - Toileting Clothing Manipulation: Sit to stand from 3-in-1 or toilet Toileting - Hygiene: Simulated;Minimal assistance Where Assessed - Toileting Hygiene: Sit to stand from 3-in-1 or toilet Vision/Perception  Vision - History Baseline Vision: Wears glasses all the time Patient Visual Report: No change from baseline Vision - Assessment Eye Alignment: Within Functional Limits Perception Perception: Within Functional Limits Praxis Praxis: Intact Cognition Cognition Arousal/Alertness: Awake/alert Overall Cognitive Status: Appears within functional limits for tasks assessed Orientation Level: Oriented X4 Cognition - Other Comments: however pt with diiferent recall of previous function with ADLs than what chart  reads Sensation/Coordination Sensation - Positive Hoffmann's sign/Tinel sign Light Touch: Appears Intact Stereognosis: Appears Intact Hot/Cold: Appears Intact Proprioception: Appears Intact Proprioception Impaired Details: Impaired LUE Coordination Gross Motor Movements are Fluid and Coordinated: No (Lt UE decrease motor control) Fine Motor Movements are Fluid and Coordinated: No (decreased motor control) Finger Nose Finger Test: delayed on left compared to right Extremity Assessment RUE Assessment RUE Assessment: Within Functional Limits LUE Assessment LUE Assessment: Exceptions to [facility] (slightly weaker than right) Mobility  Transfers Transfers: Yes Sit to Stand: 5: Supervision Stand to Sit: 5: Supervision End of Session OT - End of Session Equipment Utilized During Treatment: Gait belt;Cervical collar (RW) Activity Tolerance: Patient tolerated treatment well Patient left: in chair;with call bell in reach General Behavior During Session: [facility] for tasks performed Cognition: [facility] for tasks performed   Melonie [place] 08/21/2011, 1:36 PM

## 2011-08-21 NOTE — Progress Notes (Signed)
Inpatient Diabetes Program Recommendations  AACE/ADA: New Consensus Statement on Inpatient Glycemic Control (2009)  Target Ranges:  Prepandial:   less than 140 mg/dL      Peak postprandial:   less than 180 mg/dL (1-2 hours)      Critically ill patients:  140 - 180 mg/dL   Reason for Visit: Hyperglycemia  Inpatient Diabetes Program Recommendations Insulin - Basal: May benefit from addition of Lantus 30 units  Insulin - Meal Coverage: May benefit from addition of 6 units Novolog tid with meals   HgbA1C: Note elevated Hbg A1c of 11.3.  (Indicates poor diabetes control with average glucose 278 mg/dl) Needs f/u with PCP soon after discharge  Note:  Results for Colton Bush, Colton Bush (MRN [id number]) as of 08/21/2011 12:45  Ref. Range 08/20/2011 07:23 08/20/2011 12:06 08/20/2011 16:42 08/20/2011 21:14 08/21/2011 06:40 08/21/2011 11:24  Glucose-Capillary Latest Range: 70-99 mg/dL 914 (H) 782 (H) 956 (H) 317 (H) 294 (H) 295 (H)  Steroids currently making glycemic control more difficult, but diabetes was poorly controlled prior to admission.

## 2011-08-21 NOTE — Progress Notes (Signed)
Physical Therapy Treatment Patient Details Name: Colton Bush MRN: [id number] DOB: [birth year] Today's Date: 08/21/2011  PT Assessment/Plan  PT - Assessment/Plan Comments on Treatment Session: 56 yo a/p anterior cervical 4-5/5-6/6-7 decompression with fusion, plating, and bonegraft secondary h/o cervical spondylosis with myelopathy and cervical stenosis C4-C7 following a fall from standing. Pt progressing nicely. Pt continues to have Lt. knee recurvatum with gait and requires RW for ambulation secondary to decreased balance. Had long discussion with pt, pt agreeable to HHPT. Idealy pt would have supervision with all mobility however pt agreeing to ambulate minimal distances when supervision not available and only if necessary. Pt is to not perform stairs unless he has assistance. For this reason pt will need to start with HHPT with potential to progress to outpatient PT.  PT Plan: Discharge plan needs to be updated PT Frequency: Min 6X/week Follow Up Recommendations: Home health PT (Supervision as much as possible when OOB) Equipment Recommended: Rolling walker with 5" wheels; PT Goals  Acute Rehab PT Goals Pt will go Sit to Stand: with supervision PT Goal: Sit to Stand - Progress: Met Pt will go Stand to Sit: with modified independence PT Goal: Stand to Sit - Progress: Progressing toward goal Pt will Ambulate: >150 feet;with least restrictive assistive device;with supervision PT Goal: Ambulate - Progress: Progressing toward goal Pt will Go Up / Down Stairs: 1-2 stairs;with least restrictive assistive device;with modified independence PT Goal: Up/Down Stairs - Progress: Progressing toward goal  PT Treatment Precautions/Restrictions  Precautions Precautions: Other (comment) (Cervical precautions) Precaution Comments: Pt instructed to not flex, extend or rotate neck (pt tends to respond to questions and talk with head movements) Required Braces or Orthoses: Yes Cervical Brace: Hard collar  (All the time) Restrictions Weight Bearing Restrictions: No Mobility (including Balance) Bed Mobility Bed Mobility: No (seated at start) Transfers Transfers: Yes Sit to Stand: 6: Modified independent (Device/Increase time) (standing at start) Stand to Sit: To chair/3-in-1;With upper extremity assist;5: Supervision Stand to Sit Details: Verbal cues for placement of UEs Ambulation/Gait Ambulation/Gait: Yes Ambulation/Gait Assistance: 3: Mod assist;5: Supervision Ambulation/Gait Assistance Details (indicate cue type and reason): Supervision with ambulation with RW, up to mod assist without device secondary to decreased balance. Verbal cues to control knee during stance and pushoff of Lt. LE, recurvatum  still present.  Ambulation Distance (Feet): 350 Feet Assistive device: Rolling walker Gait Pattern: Step-through pattern;Trunk flexed;Left genu recurvatum;Decreased stance time - left Stairs: Yes Stairs Assistance: 4: Min assist Stairs Assistance Details (indicate cue type and reason): Min assist progressing to supervision, verbal cues required for safe sequencing of LEs. Pt desired repeatitive practice.  Stair Management Technique: No rails Number of Stairs: 8  (4 sets of 2 stairs)  Posture/Postural Control Posture/Postural Control: No significant limitations Balance Balance Assessed: Yes Static Sitting Balance Static Sitting - Balance Support: No upper extremity supported;Feet supported Static Sitting - Level of Assistance: 6: Modified independent (Device/Increase time) Static Standing Balance Static Standing - Balance Support: No upper extremity supported Static Standing - Level of Assistance: 5: Stand by assistance Dynamic Standing Balance Dynamic Standing - Balance Support: No upper extremity supported Dynamic Standing - Level of Assistance: 3: Mod assist (up to moderate assist) Dynamic Standing - Comments: Performed ambulation with no assistive device, pt with decreased balance  overal requiring min assist with intermittant mod assist for decreased stability.  Exercise  General Exercises - Lower Extremity Ankle Circles/Pumps: AROM;Left;Seated;10 reps Long Arc Quad: AROM;Left;15 reps End of Session PT - End of Session Equipment Utilized During  Treatment: Gait belt;Cervical collar Activity Tolerance: Patient tolerated treatment well Patient left: in chair;with call bell in reach General Behavior During Session: [facility] for tasks performed Cognition: [facility] for tasks performed  Sherie Don 08/21/2011, 3:44 PM  Sherie Don) Carleene Mains PT, DPT Acute Rehabilitation [phone]

## 2011-08-21 NOTE — Progress Notes (Signed)
Clinical social worker received consult for "SNF." Currently, PT is recommending CIR (or SNF depending on progress). CSW met with pt to complete brief psychosocial assessment, which is in pt's chart. At this time, pt is declining SNF and prefers CIR or other venue for PT. CSW will continue to follow to assess safe discharge plan.   Dede Query, MSW, Theresia Majors [phone]

## 2011-08-21 NOTE — Progress Notes (Addendum)
   CARE MANAGEMENT NOTE 08/21/2011  Patient:  Colton Bush, Colton Bush   Account Number:  [id number]  Date Initiated:  08/21/2011  Documentation initiated by:  GRAVES-BIGELOW,Doye Montilla  Subjective/Objective Assessment:   Pt fell from standing hitting his head on a metal rack then falling to the floor.S/p anterior cervical 4-5/5-6/6-7 decompression with fusion, plating,& bonegraft secondary h/o cervical spondylosis with myelopathy and cervical stenosis C4-C7.     Action/Plan:   PT is recommending CIR vs SNF. CM spoke to CSW Maralyn Sago and she stated that she would contact CIR. PT later worked with pt and they are recommending HH PT now due to pt does not have 24 hr supervision at d/c. Please write HH PT orders. Thanks   Anticipated DC Date:  08/23/2011   Anticipated DC Plan:  IP REHAB FACILITY  In-house referral  Clinical Social Worker      DC Planning Services  CM consult      Choice offered to / List presented to:             Status of service:  In process, will continue to follow Medicare Important Message given?   (If response is "NO", the following Medicare IM given date fields will be blank) Date Medicare IM given:   Date Additional Medicare IM given:    Discharge Disposition:    Per UR Regulation:    Comments:  08-21-11 1515 Tomi Bamberger, RN,BSN [phone] Pt is up ambulating with PT at this time. CM will f/u with pt.

## 2011-08-21 NOTE — Progress Notes (Signed)
   CARE MANAGEMENT NOTE 08/21/2011  Patient:  Colton Bush, Colton Bush   Account Number:  [id number]  Date Initiated:  08/21/2011  Documentation initiated by:  GRAVES-BIGELOW,Isbella Arline  Subjective/Objective Assessment:   Pt fell from standing hitting his head on a metal rack then falling to the floor.S/p anterior cervical 4-5/5-6/6-7 decompression with fusion, plating,& bonegraft secondary h/o cervical spondylosis with myelopathy and cervical stenosis C4-C7.     Action/Plan:   PT is recommending CIR vs SNF. CM spoke to CSW Maralyn Sago and she stated that she would contact CIR. PT is working with pt and recommended HH PT. Please write order for [facility] PT. Thanks   Anticipated DC Date:  08/23/2011   Anticipated DC Plan:  IP REHAB FACILITY  In-house referral  Clinical Social Worker      DC Planning Services  CM consult      Choice offered to / List presented to:          [facility] arranged  HH-2 PT  HH-3 OT      Status of service:  In process, will continue to follow Medicare Important Message given?   (If response is "NO", the following Medicare IM given date fields will be blank) Date Medicare IM given:   Date Additional Medicare IM given:    Discharge Disposition:    Per UR Regulation:    Comments:  08-21-11 1615 Tomi Bamberger, RN,BSN [phone] CM received call back from [facility] and gave her an update on pt. She will contact insurance provider  on 08-22-11 since they are closed today. She will then contact CM to make her aware of which [facility] [place] agency that pt will be able to use.  08-21-11  1545 Tomi Bamberger, RN,BSN [phone] CM spoke to pt after he ambulated with PT. He is from home with wife and she works-unable to provide 24 hr supervision. 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 CM will call her to let her know what PT has recommended. CM had to leave a vm. Hope to get a call back soon. Will continue to f/u.  08-21-11 1515 Tomi Bamberger, RN,BSN [phone] Pt is up ambulating with PT at this time. CM will f/u with pt.

## 2011-08-22 ENCOUNTER — Encounter (HOSPITAL_COMMUNITY): Payer: Self-pay | Admitting: Neurosurgery

## 2011-08-22 LAB — GLUCOSE, CAPILLARY
Glucose-Capillary: 241 mg/dL — ABNORMAL HIGH (ref 70–99)
Glucose-Capillary: 302 mg/dL — ABNORMAL HIGH (ref 70–99)
Glucose-Capillary: 336 mg/dL — ABNORMAL HIGH (ref 70–99)
Glucose-Capillary: 273 mg/dL — ABNORMAL HIGH (ref 70–99)

## 2011-08-22 NOTE — Progress Notes (Addendum)
CARE MANAGEMENT NOTE 08/22/2011 Spoke with patient- states he has a rolling walker at home, no other DME needs.Spoke with Patti Harper-RN CM with [employer]. She will arrange for Home PT thru [facility]. Patient should discharge on 08/23/11, waiting for [facility] PT orders.Marland Kitchen

## 2011-08-22 NOTE — Progress Notes (Signed)
Utilization review completed. Rhian Funari, RN, BSN. 08/22/11 

## 2011-08-22 NOTE — Discharge Planning (Signed)
Please call pt.s workers comp Financial risk analyst prior to d/c to make sure pt. is set up with everything he needs.   Venida Jarvis, RN- [phone]

## 2011-08-22 NOTE — Progress Notes (Signed)
Subjective: Patient reports "I guess I'm ok. I had hoped I would get stronger quicker; but I am stronger than I was."  Objective: Vital signs in last 24 hours: Temp:  [98 F (36.7 C)-98.8 F (37.1 C)] 98.6 F (37 C) (01/22 1000) Pulse Rate:  [68-95] 95  (01/22 1000) Resp:  [16-18] 17  (01/22 1000) BP: (100-154)/(62-94) 100/62 mmHg (01/22 1030) SpO2:  [93 %-98 %] 95 % (01/22 1000)  Intake/Output from previous day: 01/21 0701 - 01/22 0700 In: 213 [P.O.:210; I.V.:3] Out: 3825 [Urine:3825] Intake/Output this shift:    Alert, conversant. Improved strength BUE, BLE.  foley remains since retention early yesterday. Flowmax begun last pm.  Incision without swelling, erythema, or drainage.   Lab Results: No results found for this basename: WBC:2,HGB:2,HCT:2,PLT:2 in the last 72 hours BMET No results found for this basename: NA:2,K:2,CL:2,CO2:2,GLUCOSE:2,BUN:2,CREATININE:2,CALCIUM:2 in the last 72 hours  Studies/Results: No results found.  Assessment/Plan: Improving.  LOS: 4 days   Will d/w Dr. Venetia Maxon Flowmax/Foley for d/c home & HHPT. (Likely tomorrow)   Georgiann Cocker 08/22/2011, 1:28 PM

## 2011-08-22 NOTE — Progress Notes (Signed)
Physical Therapy Treatment Patient Details Name: Colton Bush MRN: [id number] DOB: [birth year] Today's Date: 08/22/2011  PT Assessment/Plan  PT - Assessment/Plan Comments on Treatment Session: 56 yo a/p anterior cervical 4-5/5-6/6-7 decompression with fusion, plating, and bonegraft secondary h/o cervical spondylosis with myelopathy and cervical stenosis C4-C7 following a fall from standing. Pt continues to have mild Lt. knee recurvatum with gait and requires RW for ambulation to prevent falling. Had long discussion with pt and case manager, pt agreeable to HHPT secondary to increased risk for falls with steps when by self (outpatient not appropriate yet). Idealy pt would have supervision with all mobility however pt agreeing to ambulate minimal distances when supervision not available and only if necessary. Pt is to not perform stairs unless he has assistance. For this reason pt will need to start with HHPT with potential to progress to outpatient PT.  PT would like to practice stairs with pt and wife prior to D/C.  PT Plan: Discharge plan needs to be updated PT Frequency: Min 6X/week Follow Up Recommendations: Home health PT (as much as possible when OOB) Equipment Recommended: Rolling walker with 5" wheels;3 in 1 bedside comode PT Goals  Acute Rehab PT Goals Pt will go Sit to Stand: with modified independence PT Goal: Sit to Stand - Progress: Progressing toward goal PT Goal: Stand to Sit - Progress: Progressing toward goal Pt will Ambulate: >150 feet;with least restrictive assistive device;with supervision PT Goal: Ambulate - Progress: Met Pt will Go Up / Down Stairs: 1-2 stairs;with least restrictive assistive device;with modified independence PT Goal: Up/Down Stairs - Progress: Progressing toward goal  PT Treatment Precautions/Restrictions  Precautions Precautions: Other (comment) (Cervical precautions) Precaution Comments: Pt instructed to not flex, extend or rotate neck (pt tends to  respond to questions and talk with head movements) Required Braces or Orthoses: Yes Cervical Brace: Hard collar (All the time) Restrictions Weight Bearing Restrictions: No Mobility (including Balance) Bed Mobility Bed Mobility: No (seated at start) Transfers Transfers: Yes Sit to Stand: 6: Modified independent (Device/Increase time) (standing at start) Stand to Sit: To chair/3-in-1;With upper extremity assist;5: Supervision;To elevated surface Stand to Sit Details: Supervision for first attempt secondary to pt forgetting to reach back for support surface.  Ambulation/Gait Ambulation/Gait: Yes Ambulation/Gait Assistance: 5: Supervision Ambulation/Gait Assistance Details (indicate cue type and reason): Supervision with RW, pt progressing to modified independent.  Ambulation Distance (Feet): 350 Feet Assistive device: Rolling walker Gait Pattern: Step-through pattern;Left genu recurvatum (improved Lt. dorsiflexion) Stairs: Yes Stairs Assistance: 4: Min assist Stairs Assistance Details (indicate cue type and reason): Min assist for loss of balance x 2 episodes. Verbal cues for safe sequencing of LEs.  Stair Management Technique: No rails Number of Stairs: 6   Posture/Postural Control Posture/Postural Control: No significant limitations Balance Balance Assessed: No End of Session PT - End of Session Equipment Utilized During Treatment: Gait belt;Cervical collar Activity Tolerance: Patient tolerated treatment well Patient left: in chair;with call bell in reach Nurse Communication: Mobility status for ambulation General Behavior During Session: [facility] for tasks performed Cognition: [facility] for tasks performed  Sherie Don 08/22/2011, 12:12 PM  Sherie Don) Carleene Mains PT, DPT Acute Rehabilitation [phone]

## 2011-08-23 LAB — GLUCOSE, CAPILLARY
Glucose-Capillary: 314 mg/dL — ABNORMAL HIGH (ref 70–99)
Glucose-Capillary: 312 mg/dL — ABNORMAL HIGH (ref 70–99)
Glucose-Capillary: 286 mg/dL — ABNORMAL HIGH (ref 70–99)
Glucose-Capillary: 314 mg/dL — ABNORMAL HIGH (ref 70–99)

## 2011-08-23 MED ORDER — HYDROCODONE-ACETAMINOPHEN 5-325 MG PO TABS: 1.0000 | ORAL_TABLET | Freq: Four times a day (QID) | ORAL | Status: AC | PRN

## 2011-08-23 MED ORDER — CYCLOBENZAPRINE HCL 10 MG PO TABS: 10.0000 mg | ORAL_TABLET | Freq: Three times a day (TID) | ORAL | Status: AC | PRN

## 2011-08-23 NOTE — Progress Notes (Signed)
PT is anticipated to discharge home with home health services today, 08/23/11. Pt's wife will be providing transportation home this evening. No further needs identified. CSW is signing off. Please call with any concerns prior to discharge.   Dede Query, MSW, Theresia Majors [phone]

## 2011-08-23 NOTE — Progress Notes (Signed)
Physical Therapy Treatment Patient Details Name: Colton Bush MRN: [id number] DOB: 1955-08-17 Today's Date: 08/23/2011  PT Assessment/Plan  PT - Assessment/Plan Comments on Treatment Session: 56 yo a/p anterior cervical 4-5/5-6/6-7 decompression with fusion, plating, and bonegraft secondary h/o cervical spondylosis with myelopathy and cervical stenosis C4-C7 following a fall from standing. Pt continues to have mild Lt. knee recurvatum with gait and requires RW for ambulation. Pt's wife present for treatment, wife and pt performed stairs together safely. Wife aware of mobility restrictions at home and pt need for assist with the stairs.  PT Plan: Discharge plan needs to be updated PT Frequency: Min 6X/week Follow Up Recommendations: Home health PT (as much as possible when OOB) Equipment Recommended: Rolling walker with 5" wheels PT Goals  Acute Rehab PT Goals Pt will go Sit to Stand: with modified independence PT Goal: Sit to Stand - Progress: Met Pt will go Stand to Sit: with modified independence PT Goal: Stand to Sit - Progress: Met Pt will Ambulate: >150 feet;with least restrictive assistive device;with supervision PT Goal: Ambulate - Progress: Met Pt will Go Up / Down Stairs: 1-2 stairs;with least restrictive assistive device;with modified independence PT Goal: Up/Down Stairs - Progress: Progressing toward goal  PT Treatment Precautions/Restrictions  Precautions Precautions: Other (comment) (cervical precautions) Precaution Comments: Pt instructed to not flex, extend or rotate neck (pt tends to respond to questions and talk with head movements) Required Braces or Orthoses: Yes Cervical Brace: Hard collar Restrictions Weight Bearing Restrictions: No Mobility (including Balance) Bed Mobility Bed Mobility: No (seated at start) Transfers Transfers: Yes Sit to Stand: 6: Modified independent (Device/Increase time);From chair/3-in-1 (standing at start) Stand to Sit: To  chair/3-in-1;With upper extremity assist;6: Modified independent (Device/Increase time);To bed Ambulation/Gait Ambulation/Gait: Yes Ambulation/Gait Assistance: 6: Modified independent (Device/Increase time);4: Min assist Ambulation/Gait Assistance Details (indicate cue type and reason): PT to facilitate knee control during late stance secondary to Lt. knee recurvatum. Educated wife on presence of recurvatum.  Ambulation Distance (Feet): 350 Feet Assistive device: Rolling walker Gait Pattern: Step-through pattern;Left genu recurvatum (improved Lt. dorsiflexion) Stairs: Yes Stairs Assistance: Other (comment) (min-guard assist) Stairs Assistance Details (indicate cue type and reason): Demonstrated for wife x1. Pt and wife performed 2 steps 3 times demonstrating appropriate positioning and safety.  Stair Management Technique: No rails Number of Stairs: 6     End of Session PT - End of Session Equipment Utilized During Treatment: Gait belt;Cervical collar Activity Tolerance: Patient tolerated treatment well Patient left: in chair;with call bell in reach;with family/visitor present General Behavior During Session: [facility] for tasks performed Cognition: [facility] for tasks performed  Sherie Don 08/23/2011, 5:16 PM  Sherie Don) Carleene Mains PT, DPT Acute Rehabilitation [phone]

## 2011-08-23 NOTE — Progress Notes (Signed)
Inpatient Diabetes Program Recommendations  AACE/ADA: New Consensus Statement on Inpatient Glycemic Control (2009)  Target Ranges:  Prepandial:   less than 140 mg/dL      Peak postprandial:   less than 180 mg/dL (1-2 hours)      Critically ill patients:  140 - 180 mg/dL   Reason for Visit: Results for Colton Bush, Colton Bush (MRN [id number]) as of 08/23/2011 12:02  Ref. Range 08/22/2011 11:18 08/22/2011 17:25 08/22/2011 21:39 08/23/2011 06:48 08/23/2011 08:08  Glucose-Capillary Latest Range: 70-99 mg/dL 782 (H) 956 (H) 213 (H) 314 (H) 312 (H)    Inpatient Diabetes Program Recommendations Insulin - Basal: May benefit from addition of Lantus 30 units  Insulin - Meal Coverage: May benefit from addition of 6 units Novolog tid with meals   HgbA1C: Note elevated Hbg A1c of 11.3.  (Indicates poor diabetes control with average glucose 278 mg/dl) Needs f/u with PCP soon after discharge  Note: Patient is also on Decadron which is likely increasing CBG's also.

## 2011-08-23 NOTE — Discharge Summary (Addendum)
Physician Discharge Summary  Patient ID: Colton Bush MRN: [id number] DOB/AGE: 01/26/1956 56 y.o.  Admit date: 08/18/2011 Discharge date: 08/23/2011  Admission Diagnoses:Cervical Myelopathy, Cervical Stenosis C4-C7, Cervical radiculopathy Discharge Diagnoses: Cervical Myelopathy, Cervical Stenosis C4-C7, Cervical radiculopathy Principal Problem:  *Cervical spondylosis with myelopathy   Discharged Condition: good and improving  Hospital Course: [name] was admitted to the hospital for treatment of a spinal cord injury sustained while at work. He was myelopathic due to the continued pressure on the spinal cord in the cervical spine. I took him to the operating room where he underwent an anterior cervical decompression and arthrodesis with instrumentation C4-C7. Post op he was weak in his lower extremities and hand intrinsics. This has improved at discharge. He is ambulating, and voiding. Speaking voice is strong. Wound is clean, dry and without signs of infection. He will have home health pt arranged. He will see me in ~3 weeks.   Consults: None    Treatments: surgery: ACDF C4-7, Synthes Plate, Structural allograft  Discharge Exam: Blood pressure 126/78, pulse 77, temperature 97.1 F (36.2 C), temperature source Oral, resp. rate 18, height 5\' 11"  (1.803 m), weight 104.7 kg (230 lb 13.2 oz), SpO2 93.00%. Neurologic: Mental status: Alert, oriented, thought content appropriate Cranial nerves: normal Motor: Weakness in Left lower extremity, and bilateral hand intrinsics Coordination: spastic gait Gait: spastic Incision/Wound:Clean and dry Weaker in the left upper extremity compared to the right.  Disposition:  Home  Medication List  As of 08/23/2011  6:34 PM   TAKE these medications         amLODipine-benazepril 10-20 MG per capsule   Commonly known as: LOTREL   Take 1 capsule by mouth daily.      aspirin 81 MG tablet   Take 81 mg by mouth daily.      cyclobenzaprine 10 MG  tablet   Commonly known as: FLEXERIL   Take 1 tablet (10 mg total) by mouth 3 (three) times daily as needed for muscle spasms.      DULoxetine 60 MG capsule   Commonly known as: CYMBALTA   Take 60 mg by mouth daily. For neuropathic pain management      FISH OIL PO   Take 1 capsule by mouth daily.      FREESTYLE LITE test strip   Generic drug: glucose blood   1 each by Other route as needed. Use as instructed      gabapentin 800 MG tablet   Commonly known as: NEURONTIN   Take 800 mg by mouth 3 (three) times daily.      glipiZIDE-metformin 5-500 MG per tablet   Commonly known as: METAGLIP   Take 1 tablet by mouth 2 (two) times daily.      HYDROcodone-acetaminophen 5-325 MG per tablet   Commonly known as: NORCO   Take 1 tablet by mouth every 6 (six) hours as needed for pain.      omeprazole 20 MG capsule   Commonly known as: PRILOSEC   Take 1 capsule (20 mg total) by mouth daily.      simvastatin 40 MG tablet   Commonly known as: ZOCOR   Take 1 tablet (40 mg total) by mouth daily.      sitaGLIPtin 100 MG tablet   Commonly known as: JANUVIA   Take 1 tablet (100 mg total) by mouth daily.             Signed: Anae Hams L 08/23/2011, 6:34 PM

## 2011-08-23 NOTE — Progress Notes (Signed)
Occupational Therapy Treatment Patient Details Name: Colton Bush MRN: [id number] DOB: 10-Mar-1956 Today's Date: 08/23/2011  OT Assessment/Plan OT Assessment/Plan Comments on Treatment Session: Pt able to perform functional transfers at supervision level.  Pt reports that he continues to have minimal numbness and tingling in bil. hands but feels it is getting better. OT Plan: Discharge plan remains appropriate OT Frequency: Min 2X/week Follow Up Recommendations: Home health OT Equipment Recommended: Rolling walker with 5" wheels;3 in 1 bedside comode OT Goals ADL Goals Pt Will Perform Grooming: with modified independence;Standing at sink ADL Goal: Grooming - Progress: Progressing toward goals Pt Will Perform Upper Body Bathing: with modified independence;Sit to stand from bed ADL Goal: Upper Body Bathing - Progress: Progressing toward goals Pt Will Perform Lower Body Bathing: with modified independence;Sit to stand from bed ADL Goal: Lower Body Bathing - Progress: Met Pt Will Perform Lower Body Dressing: with modified independence;Sit to stand from chair ADL Goal: Lower Body Dressing - Progress: Met Pt Will Transfer to Toilet: with modified independence;Ambulation;Comfort height toilet ADL Goal: Toilet Transfer - Progress: Progressing toward goals Pt Will Perform Tub/Shower Transfer: with supervision ADL Goal: Tub/Shower Transfer - Progress: Met  OT Treatment Precautions/Restrictions  Precautions Precautions: Other (comment) (cervical precautions) Precaution Comments: Pt instructed to not flex, extend or rotate neck (pt tends to respond to questions and talk with head movements) Required Braces or Orthoses: Yes Cervical Brace: Hard collar Restrictions Weight Bearing Restrictions: No   ADL ADL Grooming: Supervision/safety;Simulated Grooming Details (indicate cue type and reason): VC for safe technique when performing grooming ADLs standing at sink while maintaining cervical  precautions Where Assessed - Grooming: Standing at sink Upper Body Bathing: Simulated;Minimal assistance Upper Body Bathing Details (indicate cue type and reason): Pt unable to reach bil. axillary areas during bathing earlier this AM.  Pt instructed on using R UE to bathe R axillary area, but pt reports using this technique is still difficult. Plans to have his wife assist with bathing. Lower Body Bathing: Simulated;Modified independent Where Assessed - Lower Body Bathing: Sit to stand from chair Lower Body Dressing: Simulated;Modified independent Where Assessed - Lower Body Dressing: Sit to stand from chair Toilet Transfer: Performed;Supervision/safety Toilet Transfer Method: Proofreader: Regular height toilet Tub/Shower Transfer: Landscape architect Details (indicate cue type and reason): Simulated low step that pt has in walk in shower.   Tub/Shower Transfer Method: Ambulating Equipment Used: Rolling walker Ambulation Related to ADLs: Pt ambulated to bathroom with RW.  Pt ambulated from bathroom to bed with supervision without assistive device.  Pt feeling much steadier this AM but reports that he feels the RW continues to offer stability that he desires. ADL Comments: Pt educated and demonstrated safe donning/doffing technique of cervical collar in supine. Mobility  Bed Mobility Bed Mobility: Yes Rolling Left: 6: Modified independent (Device/Increase time) Left Sidelying to Sit: 6: Modified independent (Device/Increase time);HOB flat Sitting - Scoot to Edge of Bed: 6: Modified independent (Device/Increase time) Sit to Supine: 6: Modified independent (Device/Increase time);HOB flat Transfers Transfers: Yes Sit to Stand: 6: Modified independent (Device/Increase time);With armrests;From chair/3-in-1;From toilet Stand to Sit: 6: Modified independent (Device/Increase time);To chair/3-in-1;To toilet;With armrests Exercises    End of  Session OT - End of Session Equipment Utilized During Treatment: Cervical collar Activity Tolerance: Patient tolerated treatment well Patient left: in chair;with call bell in reach General Behavior During Session: [facility] for tasks performed Cognition: [facility] for tasks performed  Cipriano Mile  08/23/2011, 1:26 PM 08/23/2011 Cipriano Mile  OTR/L Pager [phone] Office [phone]

## 2011-08-24 ENCOUNTER — Telehealth: Payer: Self-pay | Admitting: *Deleted

## 2011-08-24 MED ORDER — METFORMIN HCL 500 MG PO TABS: 500.0000 mg | ORAL_TABLET | Freq: Two times a day (BID) | ORAL | Status: DC

## 2011-08-24 MED ORDER — GLIPIZIDE 5 MG PO TABS: 5.0000 mg | ORAL_TABLET | Freq: Two times a day (BID) | ORAL | Status: DC

## 2011-08-24 NOTE — Telephone Encounter (Signed)
Okay, stop the Januvia. Also stop the Metaglip so we can prescribe these two generic meds separately. Call in Glipizide 5 mg bid, #60 with 11rf. Also call in Metformin 500 mg bid, #60 with 11 rf

## 2011-08-24 NOTE — Telephone Encounter (Signed)
Pt is asking to speak to [place] about Januvia samples or a RX that is cheaper.

## 2011-08-24 NOTE — Telephone Encounter (Signed)
Scripts sent e-scribe, cancelled previous scripts, and spoke with pt.

## 2011-08-24 NOTE — Telephone Encounter (Signed)
I spoke with pt and the cost of Januvia is too expensive. Pt said that [facility] has the Metformin that is less expensive. Can he change to something that he can afford?

## 2011-09-06 DIAGNOSIS — R269 Unspecified abnormalities of gait and mobility: Secondary | ICD-10-CM

## 2011-09-06 DIAGNOSIS — I10 Essential (primary) hypertension: Secondary | ICD-10-CM

## 2011-09-06 DIAGNOSIS — M5020 Other cervical disc displacement, unspecified cervical region: Secondary | ICD-10-CM

## 2011-09-11 ENCOUNTER — Telehealth: Payer: Self-pay | Admitting: Family Medicine

## 2011-09-11 NOTE — Telephone Encounter (Signed)
Jasmine December from [facility] called [phone] ) pt's insurance will only cover visits for 1 x a week and for 4 weeks, just wanted to make you aware.

## 2011-09-11 NOTE — Telephone Encounter (Signed)
This was sent in error. He has actually been approved for the original amount.

## 2011-09-13 DIAGNOSIS — I10 Essential (primary) hypertension: Secondary | ICD-10-CM

## 2011-09-13 DIAGNOSIS — M5020 Other cervical disc displacement, unspecified cervical region: Secondary | ICD-10-CM

## 2011-09-13 DIAGNOSIS — R269 Unspecified abnormalities of gait and mobility: Secondary | ICD-10-CM

## 2011-09-25 ENCOUNTER — Telehealth: Payer: Self-pay | Admitting: *Deleted

## 2011-09-25 NOTE — Telephone Encounter (Signed)
Pt was discharged from [facility] , and is starting Out Patient Therapy.  Colton Bush  [phone]

## 2011-09-25 NOTE — Telephone Encounter (Signed)
noted 

## 2012-08-06 ENCOUNTER — Ambulatory Visit (INDEPENDENT_AMBULATORY_CARE_PROVIDER_SITE_OTHER): Payer: BC Managed Care – PPO | Admitting: Family Medicine

## 2012-08-06 ENCOUNTER — Encounter: Payer: Self-pay | Admitting: Family Medicine

## 2012-08-06 VITALS — BP 150/92 | HR 79 | Temp 98.7°F | Wt 234.0 lb

## 2012-08-06 DIAGNOSIS — N400 Enlarged prostate without lower urinary tract symptoms: Secondary | ICD-10-CM

## 2012-08-06 DIAGNOSIS — E785 Hyperlipidemia, unspecified: Secondary | ICD-10-CM

## 2012-08-06 DIAGNOSIS — E119 Type 2 diabetes mellitus without complications: Secondary | ICD-10-CM

## 2012-08-06 DIAGNOSIS — I10 Essential (primary) hypertension: Secondary | ICD-10-CM

## 2012-08-06 LAB — CBC WITH DIFFERENTIAL/PLATELET
Basophils Absolute: 0.1 10*3/uL (ref 0.0–0.1)
Basophils Relative: 1.2 % (ref 0.0–3.0)
Eosinophils Absolute: 0.4 10*3/uL (ref 0.0–0.7)
Eosinophils Relative: 4.7 % (ref 0.0–5.0)
HCT: 45.1 % (ref 39.0–52.0)
Hemoglobin: 15.5 g/dL (ref 13.0–17.0)
Lymphocytes Relative: 26.5 % (ref 12.0–46.0)
Lymphs Abs: 2.2 10*3/uL (ref 0.7–4.0)
MCHC: 34.3 g/dL (ref 30.0–36.0)
MCV: 91 fl (ref 78.0–100.0)
Monocytes Absolute: 0.7 10*3/uL (ref 0.1–1.0)
Monocytes Relative: 8.3 % (ref 3.0–12.0)
Neutro Abs: 4.8 10*3/uL (ref 1.4–7.7)
Neutrophils Relative %: 59.3 % (ref 43.0–77.0)
Platelets: 277 10*3/uL (ref 150.0–400.0)
RBC: 4.96 Mil/uL (ref 4.22–5.81)
RDW: 13.3 % (ref 11.5–14.6)
WBC: 8.2 10*3/uL (ref 4.5–10.5)

## 2012-08-06 LAB — POCT URINALYSIS DIPSTICK
Bilirubin, UA: NEGATIVE
Blood, UA: NEGATIVE
Leukocytes, UA: NEGATIVE
Nitrite, UA: NEGATIVE
Protein, UA: NEGATIVE
Spec Grav, UA: 1.02
Urobilinogen, UA: 0.2
pH, UA: 6.5

## 2012-08-06 LAB — HEPATIC FUNCTION PANEL
ALT: 42 U/L (ref 0–53)
AST: 25 U/L (ref 0–37)
Albumin: 4.5 g/dL (ref 3.5–5.2)
Alkaline Phosphatase: 80 U/L (ref 39–117)
Bilirubin, Direct: 0.1 mg/dL (ref 0.0–0.3)
Total Bilirubin: 0.9 mg/dL (ref 0.3–1.2)
Total Protein: 7.7 g/dL (ref 6.0–8.3)

## 2012-08-06 LAB — LIPID PANEL
Cholesterol: 222 mg/dL — ABNORMAL HIGH (ref 0–200)
HDL: 30.3 mg/dL — ABNORMAL LOW (ref >39.00)
Total CHOL/HDL Ratio: 7
Triglycerides: 262 mg/dL — ABNORMAL HIGH (ref 0.0–149.0)
VLDL: 52.4 mg/dL — ABNORMAL HIGH (ref 0.0–40.0)

## 2012-08-06 LAB — TSH: TSH: 1.87 u[IU]/mL (ref 0.35–5.50)

## 2012-08-06 LAB — BASIC METABOLIC PANEL
BUN: 16 mg/dL (ref 6–23)
CO2: 30 mEq/L (ref 19–32)
Calcium: 9.9 mg/dL (ref 8.4–10.5)
Chloride: 99 mEq/L (ref 96–112)
Creatinine, Ser: 0.7 mg/dL (ref 0.4–1.5)
GFR: 131.36 mL/min (ref >60.00)
Glucose, Bld: 225 mg/dL — ABNORMAL HIGH (ref 70–99)
Potassium: 5 mEq/L (ref 3.5–5.1)
Sodium: 137 mEq/L (ref 135–145)

## 2012-08-06 LAB — LDL CHOLESTEROL, DIRECT: Direct LDL: 133 mg/dL

## 2012-08-06 LAB — PSA: PSA: 0.58 ng/mL (ref 0.10–4.00)

## 2012-08-06 LAB — HEMOGLOBIN A1C: Hgb A1c MFr Bld: 9.6 % — ABNORMAL HIGH (ref 4.6–6.5)

## 2012-08-06 NOTE — Progress Notes (Signed)
  Subjective:    Patient ID: Colton Bush, male    DOB: [birth year], 57 y.o.   MRN: [id number]  HPI Here to follow up. He feels well except for some diffuse joint pains. He uses Tylenol prn. His random glucoses run in the 200s mostly. BP has been stable.    Review of Systems  Constitutional: Negative.   Respiratory: Negative.   Cardiovascular: Negative.   Musculoskeletal: Positive for arthralgias.       Objective:   Physical Exam  Constitutional: He appears well-developed and well-nourished.  Cardiovascular: Normal rate, regular rhythm, normal heart sounds and intact distal pulses.   Pulmonary/Chest: Effort normal and breath sounds normal.          Assessment & Plan:  Get fasting labs today.

## 2012-08-09 ENCOUNTER — Other Ambulatory Visit: Payer: Self-pay | Admitting: Family Medicine

## 2012-08-12 MED ORDER — SITAGLIPTIN PHOSPHATE 100 MG PO TABS: 100.0000 mg | ORAL_TABLET | Freq: Every day | ORAL | Status: DC

## 2012-08-12 NOTE — Progress Notes (Signed)
Quick Note:  I spoke with pt and sent script e-scribe. ______ 

## 2012-08-12 NOTE — Addendum Note (Signed)
Addended by: Aniceto Boss A on: 08/12/2012 04:11 PM   Modules accepted: Orders

## 2012-08-20 ENCOUNTER — Other Ambulatory Visit: Payer: Self-pay | Admitting: Family Medicine

## 2012-09-14 ENCOUNTER — Other Ambulatory Visit: Payer: Self-pay

## 2012-09-23 ENCOUNTER — Other Ambulatory Visit: Payer: Self-pay | Admitting: Neurosurgery

## 2012-10-15 ENCOUNTER — Ambulatory Visit (INDEPENDENT_AMBULATORY_CARE_PROVIDER_SITE_OTHER): Payer: BC Managed Care – PPO | Admitting: Family Medicine

## 2012-10-15 ENCOUNTER — Encounter: Payer: Self-pay | Admitting: Family Medicine

## 2012-10-15 VITALS — BP 150/90 | HR 92 | Temp 98.6°F | Wt 234.0 lb

## 2012-10-15 DIAGNOSIS — E119 Type 2 diabetes mellitus without complications: Secondary | ICD-10-CM

## 2012-10-15 DIAGNOSIS — I10 Essential (primary) hypertension: Secondary | ICD-10-CM

## 2012-10-15 LAB — HEMOGLOBIN A1C: Hgb A1c MFr Bld: 7.8 % — ABNORMAL HIGH (ref 4.6–6.5)

## 2012-10-15 NOTE — Progress Notes (Signed)
  Subjective:    Patient ID: Colton Bush, male    DOB: [birth year], 57 y.o.   MRN: [id number]  HPI Here to follow up on diabetes. Two months ago his A1c was 9.9, and we added Januvia to his regimen. He has been watching his diet closely. He feels good. He is working out at [employer] regularly.    Review of Systems  Constitutional: Negative.   Respiratory: Negative.   Cardiovascular: Negative.        Objective:   Physical Exam  Constitutional: He appears well-developed and well-nourished.  Pulmonary/Chest: Effort normal and breath sounds normal.  Abdominal: Soft. Bowel sounds are normal.          Assessment & Plan:  We will get another A1c today. His HTN is stable.

## 2012-10-15 NOTE — Progress Notes (Signed)
Quick Note:  Results released in my chart. ______

## 2012-10-19 ENCOUNTER — Other Ambulatory Visit: Payer: Self-pay | Admitting: Family Medicine

## 2012-10-20 ENCOUNTER — Other Ambulatory Visit: Payer: Self-pay | Admitting: Family Medicine

## 2012-11-06 ENCOUNTER — Ambulatory Visit: Payer: Self-pay | Admitting: Emergency Medicine

## 2012-11-06 VITALS — BP 200/88 | HR 76 | Temp 97.9°F | Resp 16 | Ht 70.0 in | Wt 233.0 lb

## 2012-11-06 DIAGNOSIS — Z0289 Encounter for other administrative examinations: Secondary | ICD-10-CM

## 2012-11-06 NOTE — Progress Notes (Signed)
  Subjective:    Patient ID: Colton Bush, male    DOB: September 10, 1955, 57 y.o.   MRN: [id number]  HPI  DOT certification  Review of Systems  HBP, NIDDM     Objective:   Physical Exam  GEN: WDWN, NAD, Non-toxic, A & O x 3 HEENT: Atraumatic, Normocephalic. Neck supple. No masses, No LAD. Ears and Nose: No external deformity. CV: RRR, No M/G/R. No JVD. No thrill. No extra heart sounds. PULM: CTA B, no wheezes, crackles, rhonchi. No retractions. No resp. distress. No accessory muscle use. ABD: S, NT, ND, +BS. No rebound. No HSM. EXTR: No c/c/e NEURO Normal gait.  PSYCH: Normally interactive. Conversant. Not depressed or anxious appearing.  Calm demeanor.        Assessment & Plan:   Completed for 1 year.

## 2012-11-16 ENCOUNTER — Encounter: Payer: Self-pay | Admitting: Emergency Medicine

## 2013-02-05 ENCOUNTER — Other Ambulatory Visit: Payer: Self-pay | Admitting: Family Medicine

## 2013-02-20 ENCOUNTER — Other Ambulatory Visit: Payer: Self-pay | Admitting: Family Medicine

## 2013-02-25 ENCOUNTER — Telehealth: Payer: Self-pay | Admitting: Family Medicine

## 2013-02-25 MED ORDER — METFORMIN HCL 500 MG PO TABS: 500.0000 mg | ORAL_TABLET | Freq: Two times a day (BID) | ORAL | Status: DC

## 2013-02-25 NOTE — Telephone Encounter (Signed)
Refill request for Glucophage 500 mg take 1 po bid and I did send script e-scribe.

## 2013-05-23 ENCOUNTER — Other Ambulatory Visit: Payer: Self-pay | Admitting: Family Medicine

## 2013-06-05 ENCOUNTER — Other Ambulatory Visit: Payer: Self-pay

## 2013-06-21 ENCOUNTER — Other Ambulatory Visit: Payer: Self-pay | Admitting: Family Medicine

## 2013-06-25 ENCOUNTER — Other Ambulatory Visit: Payer: Self-pay | Admitting: Family Medicine

## 2013-07-21 DIAGNOSIS — Z0279 Encounter for issue of other medical certificate: Secondary | ICD-10-CM

## 2013-07-30 ENCOUNTER — Other Ambulatory Visit: Payer: Self-pay | Admitting: Family Medicine

## 2013-08-07 ENCOUNTER — Other Ambulatory Visit: Payer: Self-pay | Admitting: Family Medicine

## 2013-08-12 ENCOUNTER — Ambulatory Visit (INDEPENDENT_AMBULATORY_CARE_PROVIDER_SITE_OTHER): Payer: 59 | Admitting: Family Medicine

## 2013-08-12 ENCOUNTER — Encounter: Payer: Self-pay | Admitting: Family Medicine

## 2013-08-12 VITALS — BP 150/84 | HR 97 | Temp 98.1°F | Wt 229.0 lb

## 2013-08-12 DIAGNOSIS — N138 Other obstructive and reflux uropathy: Secondary | ICD-10-CM

## 2013-08-12 DIAGNOSIS — I10 Essential (primary) hypertension: Secondary | ICD-10-CM

## 2013-08-12 DIAGNOSIS — N139 Obstructive and reflux uropathy, unspecified: Secondary | ICD-10-CM

## 2013-08-12 DIAGNOSIS — N401 Enlarged prostate with lower urinary tract symptoms: Secondary | ICD-10-CM

## 2013-08-12 DIAGNOSIS — E119 Type 2 diabetes mellitus without complications: Secondary | ICD-10-CM

## 2013-08-12 DIAGNOSIS — E785 Hyperlipidemia, unspecified: Secondary | ICD-10-CM

## 2013-08-12 LAB — CBC WITH DIFFERENTIAL/PLATELET
Basophils Absolute: 0.1 10*3/uL (ref 0.0–0.1)
Basophils Relative: 0.6 % (ref 0.0–3.0)
Eosinophils Absolute: 0.2 10*3/uL (ref 0.0–0.7)
Eosinophils Relative: 2.3 % (ref 0.0–5.0)
HCT: 45.4 % (ref 39.0–52.0)
Hemoglobin: 14.9 g/dL (ref 13.0–17.0)
Lymphocytes Relative: 18.4 % (ref 12.0–46.0)
Lymphs Abs: 1.9 10*3/uL (ref 0.7–4.0)
MCHC: 32.9 g/dL (ref 30.0–36.0)
MCV: 89.5 fl (ref 78.0–100.0)
Monocytes Absolute: 0.7 10*3/uL (ref 0.1–1.0)
Monocytes Relative: 7.1 % (ref 3.0–12.0)
Neutro Abs: 7.2 10*3/uL (ref 1.4–7.7)
Neutrophils Relative %: 71.6 % (ref 43.0–77.0)
Platelets: 322 10*3/uL (ref 150.0–400.0)
RBC: 5.07 Mil/uL (ref 4.22–5.81)
RDW: 13 % (ref 11.5–14.6)
WBC: 10.1 10*3/uL (ref 4.5–10.5)

## 2013-08-12 LAB — POCT URINALYSIS DIPSTICK
Bilirubin, UA: NEGATIVE
Blood, UA: NEGATIVE
Leukocytes, UA: NEGATIVE
Nitrite, UA: NEGATIVE
Spec Grav, UA: 1.025
Urobilinogen, UA: 0.2
pH, UA: 5

## 2013-08-12 LAB — BASIC METABOLIC PANEL
BUN: 16 mg/dL (ref 6–23)
CO2: 27 mEq/L (ref 19–32)
Calcium: 10.1 mg/dL (ref 8.4–10.5)
Chloride: 107 mEq/L (ref 96–112)
Creatinine, Ser: 0.8 mg/dL (ref 0.4–1.5)
GFR: 109.57 mL/min (ref >60.00)
Glucose, Bld: 260 mg/dL — ABNORMAL HIGH (ref 70–99)
Potassium: 5.8 mEq/L — ABNORMAL HIGH (ref 3.5–5.1)
Sodium: 143 mEq/L (ref 135–145)

## 2013-08-12 LAB — PSA: PSA: 0.93 ng/mL (ref 0.10–4.00)

## 2013-08-12 LAB — LIPID PANEL
Cholesterol: 205 mg/dL — ABNORMAL HIGH (ref 0–200)
HDL: 29 mg/dL — ABNORMAL LOW (ref >39.00)
Total CHOL/HDL Ratio: 7
Triglycerides: 154 mg/dL — ABNORMAL HIGH (ref 0.0–149.0)
VLDL: 30.8 mg/dL (ref 0.0–40.0)

## 2013-08-12 LAB — HEPATIC FUNCTION PANEL
ALT: 29 U/L (ref 0–53)
AST: 19 U/L (ref 0–37)
Albumin: 4.4 g/dL (ref 3.5–5.2)
Alkaline Phosphatase: 77 U/L (ref 39–117)
Bilirubin, Direct: 0.1 mg/dL (ref 0.0–0.3)
Total Bilirubin: 0.7 mg/dL (ref 0.3–1.2)
Total Protein: 8 g/dL (ref 6.0–8.3)

## 2013-08-12 LAB — TSH: TSH: 2.15 u[IU]/mL (ref 0.35–5.50)

## 2013-08-12 LAB — HEMOGLOBIN A1C: Hgb A1c MFr Bld: 10.8 % — ABNORMAL HIGH (ref 4.6–6.5)

## 2013-08-12 LAB — LDL CHOLESTEROL, DIRECT: Direct LDL: 138.7 mg/dL

## 2013-08-12 MED ORDER — SITAGLIPTIN PHOS-METFORMIN HCL 50-500 MG PO TABS: 1.0000 | ORAL_TABLET | Freq: Two times a day (BID) | ORAL | Status: DC

## 2013-08-12 NOTE — Progress Notes (Signed)
Pre visit review using our clinic review tool, if applicable. No additional management support is needed unless otherwise documented below in the visit note. 

## 2013-08-12 NOTE — Progress Notes (Signed)
   Subjective:    Patient ID: MAXMILLIAN CARSEY, male    DOB: 09-23-1955, 58 y.o.   MRN: [id number]  HPI Here to follow up. He feels great. His new insurance has not kicked in yet and he cannot afford Januvia. He asks for help. He is fasting for labs.    Review of Systems  Constitutional: Negative.   Respiratory: Negative.   Cardiovascular: Negative.        Objective:   Physical Exam  Constitutional: He appears well-developed and well-nourished.  Cardiovascular: Normal rate, regular rhythm, normal heart sounds and intact distal pulses.   Pulmonary/Chest: Effort normal and breath sounds normal.          Assessment & Plan:  Get labs today. I gave him samples of Janumet to take instead of Januvia and metformin.

## 2013-08-13 ENCOUNTER — Telehealth: Payer: Self-pay | Admitting: Family Medicine

## 2013-08-13 ENCOUNTER — Telehealth: Payer: Self-pay

## 2013-08-13 NOTE — Telephone Encounter (Signed)
Relevant patient education assigned to patient using Emmi. ° °

## 2013-08-13 NOTE — Addendum Note (Signed)
Addended by: Alysia Penna A on: 08/13/2013 06:54 AM   Modules accepted: Orders

## 2013-08-23 ENCOUNTER — Other Ambulatory Visit: Payer: Self-pay | Admitting: Family Medicine

## 2013-09-09 ENCOUNTER — Ambulatory Visit (INDEPENDENT_AMBULATORY_CARE_PROVIDER_SITE_OTHER): Payer: 59 | Admitting: Endocrinology

## 2013-09-09 ENCOUNTER — Encounter: Payer: Self-pay | Admitting: Endocrinology

## 2013-09-09 ENCOUNTER — Other Ambulatory Visit: Payer: Self-pay | Admitting: *Deleted

## 2013-09-09 ENCOUNTER — Other Ambulatory Visit: Payer: Self-pay | Admitting: Endocrinology

## 2013-09-09 VITALS — BP 148/108 | HR 97 | Temp 98.3°F | Resp 16 | Ht 71.0 in | Wt 227.4 lb

## 2013-09-09 DIAGNOSIS — IMO0001 Reserved for inherently not codable concepts without codable children: Secondary | ICD-10-CM

## 2013-09-09 DIAGNOSIS — I10 Essential (primary) hypertension: Secondary | ICD-10-CM

## 2013-09-09 DIAGNOSIS — E1165 Type 2 diabetes mellitus with hyperglycemia: Principal | ICD-10-CM

## 2013-09-09 DIAGNOSIS — E875 Hyperkalemia: Secondary | ICD-10-CM

## 2013-09-09 DIAGNOSIS — E785 Hyperlipidemia, unspecified: Secondary | ICD-10-CM

## 2013-09-09 DIAGNOSIS — E119 Type 2 diabetes mellitus without complications: Secondary | ICD-10-CM

## 2013-09-09 LAB — COMPREHENSIVE METABOLIC PANEL
ALT: 24 U/L (ref 0–53)
AST: 13 U/L (ref 0–37)
Albumin: 4.4 g/dL (ref 3.5–5.2)
Alkaline Phosphatase: 75 U/L (ref 39–117)
BUN: 13 mg/dL (ref 6–23)
CO2: 26 mEq/L (ref 19–32)
Calcium: 10 mg/dL (ref 8.4–10.5)
Chloride: 102 mEq/L (ref 96–112)
Creat: 0.64 mg/dL (ref 0.50–1.35)
Glucose, Bld: 288 mg/dL — ABNORMAL HIGH (ref 70–99)
Potassium: 4.5 mEq/L (ref 3.5–5.3)
Sodium: 139 mEq/L (ref 135–145)
Total Bilirubin: 0.6 mg/dL (ref 0.2–1.2)
Total Protein: 7.2 g/dL (ref 6.0–8.3)

## 2013-09-09 LAB — GLUCOSE, POCT (MANUAL RESULT ENTRY): POC Glucose: 419 mg/dl — AB (ref 70–99)

## 2013-09-09 NOTE — Progress Notes (Signed)
Quick Note:  Message for patient sent:: Potassium is normal. If not taking Lotrel please call for a prescription for blood pressure medication from PCP ______

## 2013-09-09 NOTE — Progress Notes (Signed)
Patient ID: Colton Bush, male   DOB: Jul 27, 1956, 58 y.o.   MRN: [id number]   Reason for Appointment : Consultation for Type 2 Diabetes  History of Present Illness          Diagnosis: Type 2 diabetes mellitus, date of diagnosis:1999        Past history: He was probably started on metformin and diagnosis and is still taking this. At some point he was also given glipizide. Apparently his blood sugars have been getting her relatively higher over the years The last couple of years he has had difficulty with affording medication because of disability and not getting insurance He has been on Januvia for the last 2-3 years with variable control. A1c has been over 7% since 2012  Recent history:  As above he has had difficulty getting consistent medications especially Januvia the last couple of years because of cost. More recently has been able to get Januvia or Janumet more regularly and is currently taking low dose Janumet with samples from his PCP He has not been monitoring his blood sugars and has not been motivated to do so. However previously when he was checking her sugars readings were around 200 or more. His glucose in the lab last month was 260 He does not think he has had any blurred vision, increased thirst, nocturia, fatigue or unusual weight loss       Oral hypoglycemic drugs the patient is taking are: Janumet, glipizide      Side effects from medications have been: None  Glucose monitoring:  done one time a day         Glucometer: Freestyle   Blood Glucose readings: Not checked recently  Hypoglycemia: none     Glycemic control:  Lab Results  Component Value Date   HGBA1C 10.8* 08/12/2013   HGBA1C 7.8* 10/15/2012   HGBA1C 9.6* 08/06/2012   Lab Results  Component Value Date   MICROALBUR 11.9* 08/18/2009   LDLCALC 75 08/23/2010   CREATININE 0.8 08/12/2013    Self-care: The diet that the patient has been following is: tries to limit fats.  Breakfast is cereal, lunch is a  sandwich and dinner is usually baked chicken. Will have nuts for snacks    Meals: 3 meals per day.          Exercise: 2-3 times per week, 45-60 minutes and evenings at the gym       Dietician visit: Most recent: Never.               Compliance with the medical regimen: Poor  Retinal exam: Most recent: 07/2013.    Weight history:  Wt Readings from Last 3 Encounters:  09/09/13 227 lb 6.4 oz (103.148 kg)  08/12/13 229 lb (103.874 kg)  11/06/12 233 lb (105.688 kg)       Medication List       This list is accurate as of: 09/09/13  9:27 AM.  Always use your most recent med list.               amLODipine-benazepril 10-20 MG per capsule  Commonly known as:  LOTREL  TAKE ONE CAPSULE BY MOUTH ONCE DAILY     aspirin 81 MG tablet  Take 81 mg by mouth daily.     diclofenac 75 MG EC tablet  Commonly known as:  VOLTAREN  Take 75 mg by mouth 2 (two) times daily.     FISH OIL PO  Take 1 capsule by mouth daily.  FREESTYLE LITE test strip  Generic drug:  glucose blood  1 each by Other route as needed. Use as instructed     gabapentin 800 MG tablet  Commonly known as:  NEURONTIN  Take 300 mg by mouth 3 (three) times daily.     glipiZIDE 5 MG tablet  Commonly known as:  GLUCOTROL  TAKE ONE TABLET BY MOUTH TWICE DAILY BEFORE MEALS     glucosamine-chondroitin 500-400 MG tablet  Take 1 tablet by mouth 2 (two) times daily.     multivitamin tablet  Take 1 tablet by mouth daily. One a Day 50 +     omeprazole 20 MG capsule  Commonly known as:  PRILOSEC  TAKE ONE CAPSULE BY MOUTH ONCE DAILY     simvastatin 40 MG tablet  Commonly known as:  ZOCOR  Take 1 tablet (40 mg total) by mouth daily.     sitaGLIPtin-metformin 50-500 MG per tablet  Commonly known as:  JANUMET  Take 1 tablet by mouth 2 (two) times daily with a meal.        Allergies: No Known Allergies  Past Medical History  Diagnosis Date  . Hyperlipidemia     takes Zocor daily  . Hypertension     takes  Amlodipine daily  . GERD (gastroesophageal reflux disease)     takes Omeprazole daily  . Diabetes mellitus type II     takes Glipizide-Metformin(Metaglip) and Januvia daily  . Depression     takes Cymbalta daily  . Bronchitis 1977  . Stiffness of hand joint     d/t cervical issues  . Neck pain     C4-7 stenosis and herniated disc  . Scoliosis     slight  . H/O hiatal hernia     Past Surgical History  Procedure Laterality Date  . Colonoscopy  may 2005,     normal, repat in 10 years  . Lymph nodes biopsy    . Melanoma rt calf  1999  . Egd with esophageal dilation  9-08    per Dr. Olevia Perches  . Tonsillectomy      as a child  . Anterior cervical decomp/discectomy fusion  08/18/2011    Procedure: ANTERIOR CERVICAL DECOMPRESSION/DISCECTOMY FUSION 3 LEVELS;  Surgeon: Winfield Cunas, MD;  Location: [facility];  Service: Neurosurgery;  Laterality: N/A;  Anterior Cervical Four-Five/Five-Six/Six-Seven Decompression with Fusion, Plating, and Bonegraft  . Carpal tunnel release  2013    bilateral, per Dr. Christella Noa   . Ulnar tunnel release  2013    right arm, per Dr. Christella Noa     Family History  Problem Relation Age of Onset  . Heart disease Father   . Heart disease Brother   . Anesthesia problems Mother     Social History:  reports that he has never smoked. He has never used smokeless tobacco. He reports that he does not drink alcohol or use illicit drugs.    Review of Systems       Lipids: Has been on simvastatin for the last few years and he thinks he is taking this regularly  Lab Results  Component Value Date   CHOL 205* 08/12/2013   HDL 29.00* 08/12/2013   LDLCALC 75 08/23/2010   LDLDIRECT 138.7 08/12/2013   TRIG 154.0* 08/12/2013   CHOLHDL 7 08/12/2013                 Skin: No rash or infections     Thyroid:  No  unusual fatigue.  HYPERTENSION has been treated with 2 medications for several years. He thinks he is not taking his Lotrel recently     No swelling of  feet.     No shortness of breath on exertion.     Bowel habits: Normal. has had reflux        No frequency of urination or nocturia       No joint  Pains. currently not taking diclofenac        No  depression          No history of Numbness, tingling or burning in  feet      Nerve pain present in his left  hand, had cervical spine surgery in 2013 and is still taking some gabapentin    LABS:  No visits with results within 1 Week(s) from this visit. Latest known visit with results is:  Office Visit on 08/12/2013  Component Date Value Range Status  . Cholesterol 08/12/2013 205* 0 - 200 mg/dL Final   ATP III Classification       Desirable:  < 200 mg/dL               Borderline High:  200 - 239 mg/dL          High:  > = 371 mg/dL  . Triglycerides 08/12/2013 154.0* 0.0 - 149.0 mg/dL Final   Normal:  <696 mg/dLBorderline High:  150 - 199 mg/dL  . HDL 08/12/2013 29.00* >39.00 mg/dL Final  . VLDL 78/93/8101 30.8  0.0 - 40.0 mg/dL Final  . Total CHOL/HDL Ratio 08/12/2013 7   Final                  Men          Women1/2 Average Risk     3.4          3.3Average Risk          5.0          4.42X Average Risk          9.6          7.13X Average Risk          15.0          11.0                      . Sodium 08/12/2013 143  135 - 145 mEq/L Final  . Potassium 08/12/2013 5.8* 3.5 - 5.1 mEq/L Final  . Chloride 08/12/2013 107  96 - 112 mEq/L Final  . CO2 08/12/2013 27  19 - 32 mEq/L Final  . Glucose, Bld 08/12/2013 260* 70 - 99 mg/dL Final  . BUN 75/04/2584 16  6 - 23 mg/dL Final  . Creatinine, Ser 08/12/2013 0.8  0.4 - 1.5 mg/dL Final  . Calcium 27/78/2423 10.1  8.4 - 10.5 mg/dL Final  . GFR 53/61/4431 109.57  >60.00 mL/min Final  . Total Bilirubin 08/12/2013 0.7  0.3 - 1.2 mg/dL Final  . Bilirubin, Direct 08/12/2013 0.1  0.0 - 0.3 mg/dL Final  . Alkaline Phosphatase 08/12/2013 77  39 - 117 U/L Final  . AST 08/12/2013 19  0 - 37 U/L Final  . ALT 08/12/2013 29  0 - 53 U/L Final  . Total  Protein 08/12/2013 8.0  6.0 - 8.3 g/dL Final  . Albumin 54/00/8676 4.4  3.5 - 5.2 g/dL Final  . Color, UA 19/50/9326 yellow   Final  . Clarity, UA 08/12/2013 clear  Final  . Glucose, UA 08/12/2013 2+   Final  . Bilirubin, UA 08/12/2013 n   Final  . Ketones, UA 08/12/2013 1+   Final  . Spec Grav, UA 08/12/2013 1.025   Final  . Blood, UA 08/12/2013 n   Final  . pH, UA 08/12/2013 5.0   Final  . Protein, UA 08/12/2013 2+   Final  . Urobilinogen, UA 08/12/2013 0.2   Final  . Nitrite, UA 08/12/2013 n   Final  . Leukocytes, UA 08/12/2013 Negative   Final  . TSH 08/12/2013 2.15  0.35 - 5.50 uIU/mL Final  . WBC 08/12/2013 10.1  4.5 - 10.5 K/uL Final  . RBC 08/12/2013 5.07  4.22 - 5.81 Mil/uL Final  . Hemoglobin 08/12/2013 14.9  13.0 - 17.0 g/dL Final  . HCT 08/12/2013 45.4  39.0 - 52.0 % Final  . MCV 08/12/2013 89.5  78.0 - 100.0 fl Final  . MCHC 08/12/2013 32.9  30.0 - 36.0 g/dL Final  . RDW 08/12/2013 13.0  11.5 - 14.6 % Final  . Platelets 08/12/2013 322.0  150.0 - 400.0 K/uL Final  . Neutrophils Relative % 08/12/2013 71.6  43.0 - 77.0 % Final  . Lymphocytes Relative 08/12/2013 18.4  12.0 - 46.0 % Final  . Monocytes Relative 08/12/2013 7.1  3.0 - 12.0 % Final  . Eosinophils Relative 08/12/2013 2.3  0.0 - 5.0 % Final  . Basophils Relative 08/12/2013 0.6  0.0 - 3.0 % Final  . Neutro Abs 08/12/2013 7.2  1.4 - 7.7 K/uL Final  . Lymphs Abs 08/12/2013 1.9  0.7 - 4.0 K/uL Final  . Monocytes Absolute 08/12/2013 0.7  0.1 - 1.0 K/uL Final  . Eosinophils Absolute 08/12/2013 0.2  0.0 - 0.7 K/uL Final  . Basophils Absolute 08/12/2013 0.1  0.0 - 0.1 K/uL Final  . Hemoglobin A1C 08/12/2013 10.8* 4.6 - 6.5 % Final   Glycemic Control Guidelines for People with Diabetes:Non Diabetic:  <6%Goal of Therapy: <7%Additional Action Suggested:  >8%   . PSA 08/12/2013 0.93  0.10 - 4.00 ng/mL Final  . Direct LDL 08/12/2013 138.7   Final   Optimal:  <100 mg/dLNear or Above Optimal:  100-129 mg/dLBorderline High:   130-159 mg/dLHigh:  160-189 mg/dLVery High:  >190 mg/dL    Physical Examination:  BP 148/108  Pulse 97  Temp(Src) 98.3 F (36.8 C)  Resp 16  Ht 5\' 11"  (1.803 m)  Wt 227 lb 6.4 oz (103.148 kg)  BMI 31.73 kg/m2  SpO2 96%  GENERAL:         Patient has generalized obesity.   HEENT:         Eye exam shows normal external appearance. Fundus exam shows no retinopathy. Oral exam shows normal mucosa .  NECK:         General:  Neck exam shows no lymphadenopathy. Carotids are normal to palpation and no bruit heard.  Thyroid is not enlarged and no nodules felt.   LUNGS:         Chest is symmetrical. Lungs are clear to auscultation.Marland Kitchen   HEART:         Heart sounds:  S1 and S2 are normal. No murmurs or clicks heard., no S3 or S4.   ABDOMEN:         General:  There is no distention present. Liver and spleen are not palpable. No other mass or tenderness present.  EXTREMITIES:     There is no edema. No skin lesions present.Marland Kitchen  NEUROLOGICAL:  Vibration sense is moderately reduced in toes. Biceps decreased on the right and normal on the left. Ankle jerks are absent bilaterally.          Diabetic foot exam:  as in the foot exam section MUSCULOSKELETAL:       There is no enlargement or deformity of the joints. Spine is normal to inspection.Marland Kitchen   PEDAL pulses: Normal SKIN:       No rash or lesions of concern.        ASSESSMENT:  Diabetes type 2, uncontrolled    The patient has had marked hyperglycemia recently with increase in A1c to almost 11%. 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 Urine microalbumin to be assessed when blood sugars are better controlled  HYPERTENSION: Poorly controlled, likely to be from not taking his Lotrel Needs to followup with PCP  Hyperlipidemia: Poorly controlled with Zocor alone, consider using 80 mg Lipitor  PLAN:   Reduce carbohydrate intake at breakfast and have protein regularly  Restart home glucose monitoring as directed with some readings after meals also  Change glipizide to glipizide ER 10 mg daily  Change Janumet to metformin ER 500 mg, 4 tablets daily  Trulicity injection 8.08 mg weekly for the first 2 weeks and then 1.5 mg weekly. Demonstrated how to do the injection in the office today and brochure and samples given  Invokana 100 mg daily for the first 5 days and then 300 mg in the morning daily. Given samples, information brochure and co-pay card  Check chemistry panel including potassium today  May start hypertension treatment with amlodipine alone since he may have had hyperkalemia from benazepril to check to see if he still is taking Lotrel  Consultation with dietitian for meal planning and general diabetes education  Consider insulin if blood sugars are not improved  Total visit time including counseling = 60 minutes    Burnie Hank 09/09/2013, 9:27 AM

## 2013-09-09 NOTE — Patient Instructions (Signed)
Please check blood sugars at least half the time about 2 hours after any meal and as directed on waking up. Please bring blood sugar monitor to each visit  Avoid cereal in the morning and have a low fat protein like peanut butter, eggs, cheese or low fat meat with small amount of carbohydrate  Continue exercising at least 3 days a week  Stop Janumet and start metformin 1000 mg twice a day, either with the home supply or new prescription Change glipizide to glipizide ER 10 mg once a day  Start Trulicity  injection 9.32 mg with the sample pen once a week on the same day of the week.    You may  experience nausea in the first few days which usually gets better the After 2 weeks increase the dose to 1.5 mg daily if no nausea.  You may inject in the stomach, thigh or arm.    You will feel fullness of the stomach with starting the medication and should try to keep portions of food small.    Start Invokana 100 mg in the morning daily for 5 days and then 300 mg daily with a prescription

## 2013-09-12 ENCOUNTER — Other Ambulatory Visit: Payer: Self-pay | Admitting: *Deleted

## 2013-09-12 MED ORDER — CANAGLIFLOZIN 300 MG PO TABS: 300.0000 mg | ORAL_TABLET | Freq: Every day | ORAL | Status: DC

## 2013-09-16 ENCOUNTER — Other Ambulatory Visit: Payer: Self-pay | Admitting: Family Medicine

## 2013-09-17 NOTE — Telephone Encounter (Signed)
Should pt continue with this medication? 

## 2013-09-18 NOTE — Telephone Encounter (Signed)
Since he now sees [name] for his diabetes, he would need to get refills from [name] for this

## 2013-09-22 ENCOUNTER — Other Ambulatory Visit: Payer: Self-pay | Admitting: *Deleted

## 2013-09-22 MED ORDER — GLIPIZIDE ER 10 MG PO TB24: 10.0000 mg | ORAL_TABLET | Freq: Every day | ORAL | Status: DC

## 2013-09-22 MED ORDER — METFORMIN HCL ER 500 MG PO TB24: ORAL_TABLET | ORAL | Status: DC

## 2013-09-30 ENCOUNTER — Ambulatory Visit: Payer: Self-pay | Admitting: Endocrinology

## 2013-10-07 ENCOUNTER — Encounter: Payer: Self-pay | Admitting: Endocrinology

## 2013-10-07 ENCOUNTER — Ambulatory Visit (INDEPENDENT_AMBULATORY_CARE_PROVIDER_SITE_OTHER): Payer: 59 | Admitting: Endocrinology

## 2013-10-07 ENCOUNTER — Other Ambulatory Visit: Payer: Self-pay | Admitting: *Deleted

## 2013-10-07 VITALS — BP 128/82 | HR 98 | Temp 98.1°F | Resp 16 | Ht 71.0 in | Wt 218.8 lb

## 2013-10-07 DIAGNOSIS — IMO0001 Reserved for inherently not codable concepts without codable children: Secondary | ICD-10-CM

## 2013-10-07 DIAGNOSIS — I10 Essential (primary) hypertension: Secondary | ICD-10-CM

## 2013-10-07 DIAGNOSIS — E1165 Type 2 diabetes mellitus with hyperglycemia: Principal | ICD-10-CM

## 2013-10-07 LAB — COMPREHENSIVE METABOLIC PANEL
ALT: 29 U/L (ref 0–53)
AST: 19 U/L (ref 0–37)
Albumin: 4.8 g/dL (ref 3.5–5.2)
Alkaline Phosphatase: 70 U/L (ref 39–117)
BUN: 17 mg/dL (ref 6–23)
CO2: 22 mEq/L (ref 19–32)
Calcium: 9.8 mg/dL (ref 8.4–10.5)
Chloride: 107 mEq/L (ref 96–112)
Creatinine, Ser: 0.6 mg/dL (ref 0.4–1.5)
GFR: 146.04 mL/min (ref >60.00)
Glucose, Bld: 90 mg/dL (ref 70–99)
Potassium: 3.8 mEq/L (ref 3.5–5.1)
Sodium: 140 mEq/L (ref 135–145)
Total Bilirubin: 0.9 mg/dL (ref 0.3–1.2)
Total Protein: 8.2 g/dL (ref 6.0–8.3)

## 2013-10-07 LAB — MICROALBUMIN / CREATININE URINE RATIO
Creatinine,U: 79.8 mg/dL
Microalb Creat Ratio: 23.8 mg/g (ref 0.0–30.0)
Microalb, Ur: 19 mg/dL — ABNORMAL HIGH (ref 0.0–1.9)

## 2013-10-07 MED ORDER — GLUCOSE BLOOD VI STRP: ORAL_STRIP | Status: DC

## 2013-10-07 MED ORDER — GLIPIZIDE ER 5 MG PO TB24: 10.0000 mg | ORAL_TABLET | Freq: Every day | ORAL | Status: DC

## 2013-10-07 MED ORDER — FREESTYLE LANCETS MISC: Status: DC

## 2013-10-07 NOTE — Progress Notes (Signed)
Patient ID: Colton Bush, male   DOB: 27-Mar-1956, 58 y.o.   MRN: [id number]   Reason for Appointment : Followup for Type 2 Diabetes  History of Present Illness          Diagnosis: Type 2 diabetes mellitus, date of diagnosis:1999        Past history: He was probably started on metformin and diagnosis and is still taking this. At some point he was also given glipizide. Apparently his blood sugars have been getting her relatively higher over the years The last couple of years he has had difficulty with affording medication because of disability and not getting insurance He had been on Januvia for the previous 2-3 years with variable control. A1c has been over 7% since 2012  Recent history:  He had significant hyperglycemia occurring since at least 2014 because of difficulty getting his medications Because of a markedly increased A1c of 10.8 despite taking his glipizide and Janumet in 2/15 he was changed to a regimen of Glipizide ER 10 mg, metformin ER 2000 mg, Invokana 258 mg and Trulicity He was also asked to reduce his carbohydrate intake at breakfast and have balanced meals With this regimen his blood sugars appear to be improving although he has checked only one or 2 readings at home since he  did not get his prescription for strips from the drug store However subjectively he had not been feeling any different before and after changing his regimen. He has tolerated the Trulicity injection very well and has not had any nausea with the 1.5 mg dose. Also he feels that this is helping him reduce his portions at all meals; has lost 9 pounds       Oral hypoglycemic drugs the patient is taking are: Glipizide ER, Invokana and metformin ER   Side effects from medications have been: None  Glucose monitoring:  done one time only recently        Glucometer: Freestyle   Blood Glucose readings: 98 one time  Hypoglycemia: none     Glycemic control:  Lab Results  Component Value Date   HGBA1C  10.8* 08/12/2013   HGBA1C 7.8* 10/15/2012   HGBA1C 9.6* 08/06/2012   Lab Results  Component Value Date   MICROALBUR 19.0* 10/07/2013   LDLCALC 75 08/23/2010   CREATININE 0.6 10/07/2013    Self-care: The diet that the patient has been following is: tries to limit fats.  Breakfast is peanut butter and toast, lunch is a sandwich and dinner is usually baked chicken. Will have nuts for snacks    Meals: 3 meals per day.          Exercise: 2-3 times per week, 45-60 minutes and evenings at the gym       Dietician visit: Most recent: Never.               Compliance with the medical regimen: Poor  Retinal exam: Most recent: 07/2013.    Weight history:  Wt Readings from Last 3 Encounters:  10/07/13 218 lb 12.8 oz (99.247 kg)  09/09/13 227 lb 6.4 oz (103.148 kg)  08/12/13 229 lb (103.874 kg)       Medication List       This list is accurate as of: 10/07/13 11:59 PM.  Always use your most recent med list.               amLODipine-benazepril 10-20 MG per capsule  Commonly known as:  LOTREL  TAKE ONE CAPSULE BY MOUTH ONCE  DAILY     aspirin 81 MG tablet  Take 81 mg by mouth daily.     Canagliflozin 300 MG Tabs  Commonly known as:  INVOKANA  Take 1 tablet (300 mg total) by mouth daily.     diclofenac 75 MG EC tablet  Commonly known as:  VOLTAREN  Take 75 mg by mouth 2 (two) times daily.     FISH OIL PO  Take 1 capsule by mouth daily.     freestyle lancets  Use as instructed to check blood sugar once a day dx code 250.00     gabapentin 800 MG tablet  Commonly known as:  NEURONTIN  Take 300 mg by mouth 3 (three) times daily.     glipiZIDE 5 MG 24 hr tablet  Commonly known as:  GLUCOTROL XL  Take 2 tablets (10 mg total) by mouth daily with breakfast.     glucosamine-chondroitin 500-400 MG tablet  Take 1 tablet by mouth 2 (two) times daily.     glucose blood test strip  Commonly known as:  FREESTYLE LITE  Use to check blood sugar once a day dx code 250.00     metFORMIN  500 MG 24 hr tablet  Commonly known as:  GLUCOPHAGE XR  Take 4 tablets daily     multivitamin tablet  Take 1 tablet by mouth daily. One a Day 50 +     omeprazole 20 MG capsule  Commonly known as:  PRILOSEC  TAKE ONE CAPSULE BY MOUTH ONCE DAILY     simvastatin 40 MG tablet  Commonly known as:  ZOCOR  Take 1 tablet (40 mg total) by mouth daily.        Allergies: No Known Allergies  Past Medical History  Diagnosis Date  . Hyperlipidemia     takes Zocor daily  . Hypertension     takes Amlodipine daily  . GERD (gastroesophageal reflux disease)     takes Omeprazole daily  . Diabetes mellitus type II     takes Glipizide-Metformin(Metaglip) and Januvia daily  . Depression     takes Cymbalta daily  . Bronchitis 1977  . Stiffness of hand joint     d/t cervical issues  . Neck pain     C4-7 stenosis and herniated disc  . Scoliosis     slight  . H/O hiatal hernia     Past Surgical History  Procedure Laterality Date  . Colonoscopy  may 2005,     normal, repat in 10 years  . Lymph nodes biopsy    . Melanoma rt calf  1999  . Egd with esophageal dilation  9-08    per Dr. Olevia Perches  . Tonsillectomy      as a child  . Anterior cervical decomp/discectomy fusion  08/18/2011    Procedure: ANTERIOR CERVICAL DECOMPRESSION/DISCECTOMY FUSION 3 LEVELS;  Surgeon: Winfield Cunas, MD;  Location: [facility];  Service: Neurosurgery;  Laterality: N/A;  Anterior Cervical Four-Five/Five-Six/Six-Seven Decompression with Fusion, Plating, and Bonegraft  . Carpal tunnel release  2013    bilateral, per Dr. Christella Noa   . Ulnar tunnel release  2013    right arm, per Dr. Christella Noa     Family History  Problem Relation Age of Onset  . Heart disease Father   . Heart disease Brother   . Anesthesia problems Mother   . Diabetes Sister     Social History:  reports that he has never smoked. He has never used smokeless tobacco. He reports that he  does not drink alcohol or use illicit drugs.    Review of  Systems       Lipids: Has been on simvastatin from his PCP for the last few years but lipids do not appear to be controlled on this  Lab Results  Component Value Date   CHOL 205* 08/12/2013   HDL 29.00* 08/12/2013   LDLCALC 75 08/23/2010   LDLDIRECT 138.7 08/12/2013   TRIG 154.0* 08/12/2013   CHOLHDL 7 08/12/2013                     No history of Numbness, tingling or burning in  feet      Nerve pain present in his left  hand, had cervical spine surgery in 2013 and is still taking some gabapentin    LABS:  Office Visit on 10/07/2013  Component Date Value Ref Range Status  . Sodium 10/07/2013 140  135 - 145 mEq/L Final  . Potassium 10/07/2013 3.8  3.5 - 5.1 mEq/L Final  . Chloride 10/07/2013 107  96 - 112 mEq/L Final  . CO2 10/07/2013 22  19 - 32 mEq/L Final  . Glucose, Bld 10/07/2013 90  70 - 99 mg/dL Final  . BUN 10/07/2013 17  6 - 23 mg/dL Final  . Creatinine, Ser 10/07/2013 0.6  0.4 - 1.5 mg/dL Final  . Total Bilirubin 10/07/2013 0.9  0.3 - 1.2 mg/dL Final  . Alkaline Phosphatase 10/07/2013 70  39 - 117 U/L Final  . AST 10/07/2013 19  0 - 37 U/L Final  . ALT 10/07/2013 29  0 - 53 U/L Final  . Total Protein 10/07/2013 8.2  6.0 - 8.3 g/dL Final  . Albumin 10/07/2013 4.8  3.5 - 5.2 g/dL Final  . Calcium 10/07/2013 9.8  8.4 - 10.5 mg/dL Final  . GFR 10/07/2013 146.04  >60.00 mL/min Final  . Microalb, Ur 10/07/2013 19.0* 0.0 - 1.9 mg/dL Final  . Creatinine,U 10/07/2013 79.8   Final  . Microalb Creat Ratio 10/07/2013 23.8  0.0 - 30.0 mg/g Final    Physical Examination:  BP 128/82  Pulse 98  Temp(Src) 98.1 F (36.7 C)  Resp 16  Ht 5\' 11"  (1.803 m)  Wt 218 lb 12.8 oz (99.247 kg)  BMI 30.53 kg/m2  SpO2 94%   ASSESSMENT:  Diabetes type 2, uncontrolled    The patient has much better blood sugar control with starting a multidrug regimen as above which he is tolerating and compliant with No hypoglycemia at this time with using 10 mg glipizide ER  HYPERTENSION: Much  better control with taking his Lotrel regularly now and potassium is normal   PLAN:    Continue same regimen  Start checking blood sugars and prescription for his strips were sent today. Discussed checking readings at different times of the day including after meals  Consultation with dietitian  After his prescription for 10 mg glipizide ER runs out he will change to 5 mg   Zion Ta 10/08/2013, 2:59 PM

## 2013-10-10 LAB — FRUCTOSAMINE: Fructosamine: 298 umol/L — ABNORMAL HIGH (ref 190–270)

## 2013-10-16 ENCOUNTER — Encounter: Payer: Self-pay | Admitting: Internal Medicine

## 2013-10-21 ENCOUNTER — Ambulatory Visit: Payer: 59 | Admitting: Family Medicine

## 2013-10-21 VITALS — BP 120/70 | HR 75 | Temp 97.7°F | Resp 18 | Ht 69.5 in | Wt 218.0 lb

## 2013-10-21 DIAGNOSIS — E1165 Type 2 diabetes mellitus with hyperglycemia: Secondary | ICD-10-CM

## 2013-10-21 DIAGNOSIS — I10 Essential (primary) hypertension: Secondary | ICD-10-CM

## 2013-10-21 DIAGNOSIS — Z0289 Encounter for other administrative examinations: Secondary | ICD-10-CM

## 2013-10-21 DIAGNOSIS — IMO0001 Reserved for inherently not codable concepts without codable children: Secondary | ICD-10-CM

## 2013-10-21 NOTE — Patient Instructions (Signed)
Debrox

## 2013-10-21 NOTE — Progress Notes (Signed)
DOT Physical examination:  Patient is here for his DVT examination. He says he has to get yearly. He drives and armored truck. He is diabetic. It was badly out of control and his primary care doctor referred to [name], endocrinologist. In the last couple of months there've been some extensive medicines changes. He has lost 20 pounds. He is feeling better.  Past medical history: Medical illnesses: Diabetes Hypertension Hyperlipidemia GERD Cerumen impaction Esophageal strictures Vital hernia and Surgical history: Tonsils, cervical disc, carpal tunnel, colonoscopy, melanoma on leg Medications: See list Allergies: None known  Family history: No major familial diseases  Social history: Truck driver of an hour vehicle. Doesn't get a lot of exercise but is getting more regular exercise now. Has a gym membership now. Does not smoke or drink. He is planning a vacation to [place] she is excited about. Review of systems: No acute complaints  Physical exam: Healthy-appearing man in no acute distress. TMs normal. Eyes PERRLA. Neck supple without nodes thyromegaly. Throat clear. Chest clear process. Heart regular without murmurs gallops or arrhythmias. And soft the mass or tenderness. Normal male external genitalia testes distended. No hernias. Extremities unremarkable skin unremarkable  Assessment:  Normal DOT physical examination Diabetes, improving control Hypertension Hyperlipidemia GERD History of melanoma  Plan: DOT card for one year. Even though his last A1c was high, he has changed a lot of things in medicine and weight since then, and is being, followed by [name] who I am sure will get them in decent control. Cautioned him about hypoglycemia. He is to discuss this with his endocrinologist as to whether he is any risk of it as they get tight control.

## 2013-11-04 ENCOUNTER — Ambulatory Visit: Payer: Self-pay | Admitting: *Deleted

## 2013-11-10 ENCOUNTER — Telehealth: Payer: Self-pay | Admitting: Family Medicine

## 2013-11-10 NOTE — Telephone Encounter (Signed)
Pt's wife states his medication has to go through [facility].  She could not give me the list of medication, but states ALL of them.  I left a message for pt to call office back and verify the transfer along with medication names.

## 2013-11-11 NOTE — Telephone Encounter (Signed)
Pt said these are the meds that need to be set up on mail order.Marland Kitchen amLODipine-benazepril (LOTREL) 10-20 MG per capsule  and omeprazole (PRILOSEC) 20 MG capsule

## 2013-11-12 MED ORDER — OMEPRAZOLE 20 MG PO CPDR: DELAYED_RELEASE_CAPSULE | ORAL | Status: DC

## 2013-11-12 MED ORDER — AMLODIPINE BESY-BENAZEPRIL HCL 10-20 MG PO CAPS: ORAL_CAPSULE | ORAL | Status: DC

## 2013-11-12 NOTE — Telephone Encounter (Signed)
Scripts were sent e-scribe.  

## 2013-11-18 ENCOUNTER — Encounter: Payer: Self-pay | Admitting: *Deleted

## 2013-11-18 ENCOUNTER — Other Ambulatory Visit (INDEPENDENT_AMBULATORY_CARE_PROVIDER_SITE_OTHER): Payer: 59

## 2013-11-18 ENCOUNTER — Encounter: Payer: Self-pay | Admitting: Endocrinology

## 2013-11-18 ENCOUNTER — Other Ambulatory Visit: Payer: Self-pay | Admitting: *Deleted

## 2013-11-18 ENCOUNTER — Ambulatory Visit (INDEPENDENT_AMBULATORY_CARE_PROVIDER_SITE_OTHER): Payer: 59 | Admitting: Endocrinology

## 2013-11-18 ENCOUNTER — Encounter: Payer: 59 | Attending: Endocrinology | Admitting: *Deleted

## 2013-11-18 VITALS — BP 124/74 | HR 77 | Temp 98.1°F | Resp 16 | Ht 71.0 in | Wt 220.0 lb

## 2013-11-18 VITALS — Ht 71.0 in | Wt 220.3 lb

## 2013-11-18 DIAGNOSIS — E1165 Type 2 diabetes mellitus with hyperglycemia: Principal | ICD-10-CM

## 2013-11-18 DIAGNOSIS — E785 Hyperlipidemia, unspecified: Secondary | ICD-10-CM

## 2013-11-18 DIAGNOSIS — IMO0001 Reserved for inherently not codable concepts without codable children: Secondary | ICD-10-CM

## 2013-11-18 DIAGNOSIS — E119 Type 2 diabetes mellitus without complications: Secondary | ICD-10-CM

## 2013-11-18 DIAGNOSIS — Z713 Dietary counseling and surveillance: Secondary | ICD-10-CM | POA: Insufficient documentation

## 2013-11-18 DIAGNOSIS — I10 Essential (primary) hypertension: Secondary | ICD-10-CM

## 2013-11-18 LAB — COMPREHENSIVE METABOLIC PANEL
ALT: 28 U/L (ref 0–53)
AST: 21 U/L (ref 0–37)
Albumin: 4.4 g/dL (ref 3.5–5.2)
Alkaline Phosphatase: 61 U/L (ref 39–117)
BUN: 16 mg/dL (ref 6–23)
CO2: 26 mEq/L (ref 19–32)
Calcium: 9.8 mg/dL (ref 8.4–10.5)
Chloride: 104 mEq/L (ref 96–112)
Creatinine, Ser: 0.6 mg/dL (ref 0.4–1.5)
GFR: 140.57 mL/min (ref >60.00)
Glucose, Bld: 147 mg/dL — ABNORMAL HIGH (ref 70–99)
Potassium: 4.2 mEq/L (ref 3.5–5.1)
Sodium: 140 mEq/L (ref 135–145)
Total Bilirubin: 0.7 mg/dL (ref 0.3–1.2)
Total Protein: 8 g/dL (ref 6.0–8.3)

## 2013-11-18 LAB — LIPID PANEL
Cholesterol: 183 mg/dL (ref 0–200)
HDL: 30.6 mg/dL — ABNORMAL LOW (ref >39.00)
LDL Cholesterol: 122 mg/dL — ABNORMAL HIGH (ref 0–99)
Total CHOL/HDL Ratio: 6
Triglycerides: 153 mg/dL — ABNORMAL HIGH (ref 0.0–149.0)
VLDL: 30.6 mg/dL (ref 0.0–40.0)

## 2013-11-18 LAB — HEMOGLOBIN A1C: Hgb A1c MFr Bld: 7.8 % — ABNORMAL HIGH (ref 4.6–6.5)

## 2013-11-18 MED ORDER — PRAVASTATIN SODIUM 40 MG PO TABS: 40.0000 mg | ORAL_TABLET | Freq: Every day | ORAL | Status: DC

## 2013-11-18 NOTE — Patient Instructions (Addendum)
Please check blood sugars at least half the time about 2 hours after any meal and as directed on waking up. Please bring blood sugar monitor to each visit  Call if sugars consistently > 180 after meals or over 140 in the morning Keep appointment with nutritionist today Start pravastatin

## 2013-11-18 NOTE — Progress Notes (Signed)
Patient ID: Colton Bush, male   DOB: 04-18-1956, 58 y.o.   MRN: [id number]   Reason for Appointment : Followup for Type 2 Diabetes  History of Present Illness          Diagnosis: Type 2 diabetes mellitus, date of diagnosis:1999        Past history: He was probably started on metformin and diagnosis and is still taking this. At some point he was also given glipizide. Apparently his blood sugars have been getting her relatively higher over the years The last couple of years he has had difficulty with affording medication because of disability and not getting insurance He had been on Januvia for the previous 2-3 years with variable control. A1c has been over 7% since 2012  Recent history:  He had significant hyperglycemia occurring since at least 2014 because of difficulty getting his medications Because of a markedly increased A1c of 10.8 despite taking his glipizide and Janumet in 2/15 he was changed to a regimen of Glipizide ER 10 mg, metformin ER 2000 mg, Invokana 378 mg and Trulicity He was also asked to reduce his carbohydrate intake at breakfast and have balanced meals However for some reason his prescription of Trulicity was not done and he has not taken this He did however lose 9 pounds and has maintained her weight loss Blood sugars recently appear to be fairly good  Also he feels that this is helping him reduce his portions at all meals; has lost 9 pounds Recently has not checked any readings after meals       Oral hypoglycemic drugs the patient is taking are: Glipizide ER, Invokana and metformin ER   Side effects from medications have been: None  Glucose monitoring:  done sporadically        Glucometer: Freestyle   Blood Glucose readings: Mostly in the 130s, checking only fasting Hypoglycemia: none     Glycemic control:  Lab Results  Component Value Date   HGBA1C 7.8* 11/18/2013   HGBA1C 10.8* 08/12/2013   HGBA1C 7.8* 10/15/2012   Lab Results  Component Value Date   MICROALBUR 19.0* 10/07/2013   LDLCALC 122* 11/18/2013   CREATININE 0.6 11/18/2013    Self-care: The diet that the patient has been following is: tries to limit fats.  Breakfast is peanut butter and toast, lunch is a sandwich and dinner is usually baked chicken. Will have nuts for snacks    Meals: 3 meals per day.          Exercise: 2-3 times per week, 45-60 minutes and evenings at the gym       Dietician visit: To be done today             Compliance with the medical regimen: Poor  Retinal exam: Most recent: 07/2013.    Weight history:  Wt Readings from Last 3 Encounters:  11/18/13 220 lb 4.8 oz (99.927 kg)  11/18/13 220 lb (99.791 kg)  10/21/13 218 lb (98.884 kg)       Medication List       This list is accurate as of: 11/18/13 11:59 PM.  Always use your most recent med list.               amLODipine-benazepril 10-20 MG per capsule  Commonly known as:  LOTREL  TAKE ONE CAPSULE BY MOUTH ONCE DAILY     aspirin 81 MG tablet  Take 81 mg by mouth daily.     Canagliflozin 300 MG Tabs  Commonly known  as:  INVOKANA  Take 1 tablet (300 mg total) by mouth daily.     FISH OIL PO  Take 1 capsule by mouth daily.     freestyle lancets  Use as instructed to check blood sugar once a day dx code 250.00     gabapentin 800 MG tablet  Commonly known as:  NEURONTIN  Take 300 mg by mouth 3 (three) times daily.     glipiZIDE 5 MG 24 hr tablet  Commonly known as:  GLUCOTROL XL  Take 2 tablets (10 mg total) by mouth daily with breakfast.     glucosamine-chondroitin 500-400 MG tablet  Take 1 tablet by mouth 2 (two) times daily.     glucose blood test strip  Commonly known as:  FREESTYLE LITE  Use to check blood sugar once a day dx code 250.00     metFORMIN 500 MG 24 hr tablet  Commonly known as:  GLUCOPHAGE XR  Take 4 tablets daily     multivitamin tablet  Take 1 tablet by mouth daily. One a Day 50 +     omeprazole 20 MG capsule  Commonly known as:  PRILOSEC  TAKE ONE  CAPSULE BY MOUTH ONCE DAILY     pravastatin 40 MG tablet  Commonly known as:  PRAVACHOL  Take 1 tablet (40 mg total) by mouth daily.        Allergies: No Known Allergies  Past Medical History  Diagnosis Date  . Hyperlipidemia     takes Zocor daily  . Hypertension     takes Amlodipine daily  . GERD (gastroesophageal reflux disease)     takes Omeprazole daily  . Diabetes mellitus type II     takes Glipizide-Metformin(Metaglip) and Januvia daily  . Depression     takes Cymbalta daily  . Bronchitis 1977  . Stiffness of hand joint     d/t cervical issues  . Neck pain     C4-7 stenosis and herniated disc  . Scoliosis     slight  . H/O hiatal hernia     Past Surgical History  Procedure Laterality Date  . Colonoscopy  may 2005,     normal, repat in 10 years  . Lymph nodes biopsy    . Melanoma rt calf  1999  . Egd with esophageal dilation  9-08    per Dr. Olevia Perches  . Tonsillectomy      as a child  . Anterior cervical decomp/discectomy fusion  08/18/2011    Procedure: ANTERIOR CERVICAL DECOMPRESSION/DISCECTOMY FUSION 3 LEVELS;  Surgeon: Winfield Cunas, MD;  Location: [facility];  Service: Neurosurgery;  Laterality: N/A;  Anterior Cervical Four-Five/Five-Six/Six-Seven Decompression with Fusion, Plating, and Bonegraft  . Carpal tunnel release  2013    bilateral, per Dr. Christella Noa   . Ulnar tunnel release  2013    right arm, per Dr. Christella Noa   . Spine surgery      Family History  Problem Relation Age of Onset  . Heart disease Father   . Heart disease Brother   . Anesthesia problems Mother   . Diabetes Sister     Social History:  reports that he has never smoked. He has never used smokeless tobacco. He reports that he does not drink alcohol or use illicit drugs.    Review of Systems   Hypertension: He is compliant with his Lotrel. Blood pressure is better and also may be benefiting from Invokana      Lipids: Had been on simvastatin from his  PCP for the last few years  but for some reason he has not been getting his prescription filled for several months  Lab Results  Component Value Date   CHOL 183 11/18/2013   HDL 30.60* 11/18/2013   LDLCALC 122* 11/18/2013   LDLDIRECT 138.7 08/12/2013   TRIG 153.0* 11/18/2013   CHOLHDL 6 11/18/2013                    Nerve pain present in his left  hand, had cervical spine surgery in 2013 and is still taking some gabapentin    LABS:  Appointment on 11/18/2013  Component Date Value Ref Range Status  . Hemoglobin A1C 11/18/2013 7.8* 4.6 - 6.5 % Final   Glycemic Control Guidelines for People with Diabetes:Non Diabetic:  <6%Goal of Therapy: <7%Additional Action Suggested:  >8%   . Sodium 11/18/2013 140  135 - 145 mEq/L Final  . Potassium 11/18/2013 4.2  3.5 - 5.1 mEq/L Final  . Chloride 11/18/2013 104  96 - 112 mEq/L Final  . CO2 11/18/2013 26  19 - 32 mEq/L Final  . Glucose, Bld 11/18/2013 147* 70 - 99 mg/dL Final  . BUN 11/18/2013 16  6 - 23 mg/dL Final  . Creatinine, Ser 11/18/2013 0.6  0.4 - 1.5 mg/dL Final  . Total Bilirubin 11/18/2013 0.7  0.3 - 1.2 mg/dL Final  . Alkaline Phosphatase 11/18/2013 61  39 - 117 U/L Final  . AST 11/18/2013 21  0 - 37 U/L Final  . ALT 11/18/2013 28  0 - 53 U/L Final  . Total Protein 11/18/2013 8.0  6.0 - 8.3 g/dL Final  . Albumin 11/18/2013 4.4  3.5 - 5.2 g/dL Final  . Calcium 11/18/2013 9.8  8.4 - 10.5 mg/dL Final  . GFR 11/18/2013 140.57  >60.00 mL/min Final  . Cholesterol 11/18/2013 183  0 - 200 mg/dL Final   ATP III Classification       Desirable:  < 200 mg/dL               Borderline High:  200 - 239 mg/dL          High:  > = 240 mg/dL  . Triglycerides 11/18/2013 153.0* 0.0 - 149.0 mg/dL Final   Normal:  <150 mg/dLBorderline High:  150 - 199 mg/dL  . HDL 11/18/2013 30.60* >39.00 mg/dL Final  . VLDL 11/18/2013 30.6  0.0 - 40.0 mg/dL Final  . LDL Cholesterol 11/18/2013 122* 0 - 99 mg/dL Final  . Total CHOL/HDL Ratio 11/18/2013 6   Final                  Men           Women1/2 Average Risk     3.4          3.3Average Risk          5.0          4.42X Average Risk          9.6          7.13X Average Risk          15.0          11.0                        Physical Examination:  BP 124/74  Pulse 77  Temp(Src) 98.1 F (36.7 C)  Resp 16  Ht 5\' 11"  (1.803 m)  Wt 220 lb (99.791 kg)  BMI 30.70 kg/m2  SpO2 95%   ASSESSMENT:  Diabetes type 2, uncontrolled    The patient has overall fairly good  blood sugar control with starting a multidrug regimen as above which he is tolerating and compliant with Also his glipizide ER was reduced to 5 mg To have A1c checked today He has not been taking glucose readings after meals although today it is fairly good after breakfast  HYPERTENSION: Good control now with taking his Lotrel regularly now   History of hyperkalemia: Potassium to be checked again today  Hyperlipidemia: He was previously on Zocor and needs to start back on a statin drug  PLAN:    Continue same regimen for now unless he has higher readings  Start checking blood sugars more regularly and bring monitor for download. Discussed checking readings at different times of the day including after meals  Consultation with dietitian  May try to Trulicity again if he tends to have higher readings on waking  For hypercholesterolemia since Zocor will interact with amlodipine will switch him to pravastatin  Followup in 3 months   Khrystyna Schwalm 11/19/2013, 9:33 AM

## 2013-11-18 NOTE — Progress Notes (Signed)
Appt start time: 1730 end time:  1900.  Assessment:  Patient was seen on  11/18/13 for individual diabetes education. He is here with his wife, [name], who appears supportive. He has had diabetes for over 10 years but his control has recently worsened. 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 He states he has lost about 15 pounds in past 6 weeks. He states he SMBG every AM with reported range of 107-150 mg/dl.   Current HbA1c: 10.8%  Preferred Learning Style:    No preference indicated   Learning Readiness:   Ready  Change in progress  MEDICATIONS: see list, diabetes medications are Glipizide ER, Metformin ER, Trulicity and Invokana  DIETARY INTAKE:  24-hr recall:  B ( AM): used to eat cereal every AM, now he eats 2-3 boiled eggs OR 2 slices cheese on 2 slices of bread  Snk ( AM): mixed nuts occasionally  L ( PM): sandwich with lean meat, small bag of tortilla chips,  Snk ( PM): mixed nuts  D ( PM): baked chicken or fish, vegetables, infrequently potato or rice, [place] pasta  Snk ( PM): nuts or corn chips Beverages: sugar free, caffeine free orange drinks and flavored waters, diet soda  Usual physical activity: belongs to [employer], goes 2-3 times a week for about 45 minutes to an hour and he states he walks on the weekends  Estimated energy needs: 1400 calories 158 g carbohydrates 105 g protein 39 g fat  Progress Towards Goal(s):  In progress.   Nutritional Diagnosis:  NB-1.1 Food and nutrition-related knowledge deficit As related to diabetes.  As evidenced by A1c of 10.8%.    Intervention:  Nutrition counseling provided.  Discussed diabetes disease process and treatment options.  Discussed physiology of diabetes and role of obesity on insulin resistance.  Encouraged  moderate weight reduction to improve glucose levels.  Discussed role of medications and diet in glucose control  Provided education on macronutrients on glucose levels.  Provided education on carb counting, importance of regularly scheduled meals/snacks, and meal planning  Discussed effects of physical activity on glucose levels and long-term glucose control.  Recommended he continue with 150 minutes of physical activity/week.  Reviewed patient medications.  Discussed role of medication on blood glucose and possible side effects  Discussed blood glucose monitoring and interpretation.  Discussed recommended target ranges and individual ranges.    Described short-term complications: hyper- and hypo-glycemia.  Discussed causes,symptoms, and treatment options.  Discussed prevention, detection, and treatment of long-term complications.  Discussed the role of prolonged elevated glucose levels on body systems.  Discussed role of stress on blood glucose levels and discussed strategies to manage psychosocial issues.  Discussed recommendations for long-term diabetes self-care.  Provided checklist for medical, dental, and emotional self-care.  Plan:  Aim for 2 Carb Choices per meal (30 grams) +/- 1 either way  Aim for 0-1 Carbs per snack if hungry  Include protein in moderation with your meals and snacks Consider reading food labels for Total Carbohydrate of foods Continue with your activity level daily as tolerated Consider checking BG at alternate times per day as directed by MD  If you start having any low BG symptoms, let [name] know so he can adjust your medication.  Teaching Method Utilized: Visual, Auditory and  Hands on  Handouts given during visit include: Living Well with Diabetes Carb Counting and Food Label handouts Meal Plan Card  Barriers to learning/adherence to lifestyle change: progressing diabetes and other medical problems  Diabetes self-care support plan:   [facility]  support group  Demonstrated degree of understanding via:  Teach Back   Monitoring/Evaluation:  Dietary intake, exercise, reading food labels, and body weight prn.

## 2013-11-18 NOTE — Patient Instructions (Signed)
Plan:  Aim for 2 Carb Choices per meal (30 grams) +/- 1 either way  Aim for 0-1 Carbs per snack if hungry  Include protein in moderation with your meals and snacks Consider reading food labels for Total Carbohydrate of foods Continue with your activity level daily as tolerated Consider checking BG at alternate times per day as directed by MD  If you start having any low BG symptoms, let [name] know so he can adjust your medication.

## 2014-01-16 ENCOUNTER — Other Ambulatory Visit: Payer: Self-pay | Admitting: Endocrinology

## 2014-01-20 ENCOUNTER — Other Ambulatory Visit: Payer: Self-pay | Admitting: Endocrinology

## 2014-02-03 ENCOUNTER — Telehealth: Payer: Self-pay | Admitting: Family Medicine

## 2014-02-03 ENCOUNTER — Telehealth: Payer: Self-pay | Admitting: Endocrinology

## 2014-02-03 ENCOUNTER — Other Ambulatory Visit: Payer: Self-pay | Admitting: *Deleted

## 2014-02-03 MED ORDER — PRAVASTATIN SODIUM 40 MG PO TABS: 40.0000 mg | ORAL_TABLET | Freq: Every day | ORAL | Status: DC

## 2014-02-03 NOTE — Telephone Encounter (Signed)
rx sent

## 2014-02-03 NOTE — Telephone Encounter (Signed)
Pt's wife states they are leaving to go out of the country for several weeks and pt is needing his meds to be sent [facility] verses his standard mail order because it would take to long to get to him. Pt needs amLODipine-benazepril (LOTREL) 10-20 MG per capsule and omeprazole (prilosec) 20 mg.

## 2014-02-03 NOTE — Telephone Encounter (Signed)
Patients wife called stating that Mr. Hawker is going to need a refill of his Rx pravastatin (PRAVACHOL) 40 MG tablet  They will be going out the country  Please send Rx to [facility]. [phone]  Thank You

## 2014-02-05 MED ORDER — AMLODIPINE BESY-BENAZEPRIL HCL 10-20 MG PO CAPS: ORAL_CAPSULE | ORAL | Status: DC

## 2014-02-05 MED ORDER — OMEPRAZOLE 20 MG PO CPDR: DELAYED_RELEASE_CAPSULE | ORAL | Status: DC

## 2014-02-05 NOTE — Telephone Encounter (Signed)
[facility] sent [facility]

## 2014-02-10 ENCOUNTER — Encounter: Payer: Self-pay | Admitting: Endocrinology

## 2014-02-10 ENCOUNTER — Ambulatory Visit (INDEPENDENT_AMBULATORY_CARE_PROVIDER_SITE_OTHER): Payer: 59 | Admitting: Endocrinology

## 2014-02-10 ENCOUNTER — Other Ambulatory Visit: Payer: Self-pay | Admitting: *Deleted

## 2014-02-10 VITALS — BP 145/87 | HR 70 | Temp 98.3°F | Resp 16 | Ht 71.0 in | Wt 221.4 lb

## 2014-02-10 DIAGNOSIS — E785 Hyperlipidemia, unspecified: Secondary | ICD-10-CM

## 2014-02-10 DIAGNOSIS — IMO0001 Reserved for inherently not codable concepts without codable children: Secondary | ICD-10-CM

## 2014-02-10 DIAGNOSIS — E1165 Type 2 diabetes mellitus with hyperglycemia: Principal | ICD-10-CM

## 2014-02-10 LAB — LIPID PANEL
Cholesterol: 119 mg/dL (ref 0–200)
HDL: 31.9 mg/dL — ABNORMAL LOW (ref >39.00)
LDL Cholesterol: 68 mg/dL (ref 0–99)
NonHDL: 87.1
Total CHOL/HDL Ratio: 4
Triglycerides: 94 mg/dL (ref 0.0–149.0)
VLDL: 18.8 mg/dL (ref 0.0–40.0)

## 2014-02-10 LAB — COMPREHENSIVE METABOLIC PANEL
ALT: 31 U/L (ref 0–53)
AST: 20 U/L (ref 0–37)
Albumin: 4.4 g/dL (ref 3.5–5.2)
Alkaline Phosphatase: 60 U/L (ref 39–117)
BUN: 16 mg/dL (ref 6–23)
CO2: 27 mEq/L (ref 19–32)
Calcium: 9.6 mg/dL (ref 8.4–10.5)
Chloride: 104 mEq/L (ref 96–112)
Creatinine, Ser: 0.6 mg/dL (ref 0.4–1.5)
GFR: 148.73 mL/min (ref >60.00)
Glucose, Bld: 178 mg/dL — ABNORMAL HIGH (ref 70–99)
Potassium: 3.8 mEq/L (ref 3.5–5.1)
Sodium: 138 mEq/L (ref 135–145)
Total Bilirubin: 0.8 mg/dL (ref 0.2–1.2)
Total Protein: 7.6 g/dL (ref 6.0–8.3)

## 2014-02-10 LAB — HEMOGLOBIN A1C: Hgb A1c MFr Bld: 7.7 % — ABNORMAL HIGH (ref 4.6–6.5)

## 2014-02-10 MED ORDER — CANAGLIFLOZIN 300 MG PO TABS: ORAL_TABLET | ORAL | Status: DC

## 2014-02-10 NOTE — Patient Instructions (Addendum)
Please check blood sugars at least half the time about 2 hours after any meal and times per week on waking up.  Please bring blood sugar monitor to each visit  Try to exercise 4-5 times a week

## 2014-02-10 NOTE — Progress Notes (Signed)
Patient ID: Colton Bush, male   DOB: May 16, 1956, 58 y.o.   MRN: [id number]   Reason for Appointment : Followup for Type 2 Diabetes  History of Present Illness          Diagnosis: Type 2 diabetes mellitus, date of diagnosis:1999        Past history: He was probably started on metformin and diagnosis and is still taking this. At some point he was also given glipizide. Apparently his blood sugars have been getting her relatively higher over the years The last couple of years he has had difficulty with affording medication because of disability and not getting insurance He had been on Januvia for the previous 2-3 years with variable control. A1c has been over 7% since 2012  Recent history:  He had significant hyperglycemia occurring since at least 2014 because of difficulty getting his medications Because of a markedly increased A1c of 10.8 despite taking his glipizide and Janumet in 2/15 he was changed to a regimen of Glipizide ER 10 mg, metformin ER 2000 mg, Invokana 300 mg.  Also had been given Trulicity initially with this was not continued and his blood sugars have been reasonably good without it He has been able to maintain some of the weight loss he had after his first visit Blood sugars recently appear to be fairly good but does not checking any readings after meals as directed He has seen a dietitian recently and has made some changes based on the consultation A1c report not available       Oral hypoglycemic drugs the patient is taking are: Glipizide ER, Invokana and metformin ER   Side effects from medications have been: None   Glucose monitoring:  done once a day        Glucometer: Freestyle   Blood Glucose readings: Range 110-191 with average 147 checking only fasting glucose readings Hypoglycemia: none     Glycemic control:  Lab Results  Component Value Date   HGBA1C 7.8* 11/18/2013   HGBA1C 10.8* 08/12/2013   HGBA1C 7.8* 10/15/2012   Lab Results  Component Value Date   MICROALBUR 19.0* 10/07/2013   LDLCALC 122* 11/18/2013   CREATININE 0.6 11/18/2013    Self-care: The diet that the patient has been following is: tries to limit fats.  Breakfast is peanut butter and toast, lunch is a sandwich and dinner is usually baked chicken. Will have nuts for snacks    Meals: 3 meals per day.          Exercise: 2-3 times per week, 45-60 minutes and evenings at the gym       Dietician visit: 4/15            Retinal exam: Most recent: 07/2013.    Weight history:  Wt Readings from Last 3 Encounters:  02/10/14 221 lb 6.4 oz (100.426 kg)  11/18/13 220 lb 4.8 oz (99.927 kg)  11/18/13 220 lb (99.791 kg)       Medication List       This list is accurate as of: 02/10/14 11:06 AM.  Always use your most recent med list.               amLODipine-benazepril 10-20 MG per capsule  Commonly known as:  LOTREL  TAKE ONE CAPSULE BY MOUTH ONCE DAILY     aspirin 81 MG tablet  Take 81 mg by mouth daily.     Canagliflozin 300 MG Tabs  Commonly known as:  INVOKANA  TAKE ONE TABLET BY  MOUTH ONCE DAILY     FISH OIL PO  Take 1 capsule by mouth daily.     freestyle lancets  Use as instructed to check blood sugar once a day dx code 250.00     gabapentin 800 MG tablet  Commonly known as:  NEURONTIN  Take 300 mg by mouth 3 (three) times daily.     glipiZIDE 5 MG 24 hr tablet  Commonly known as:  GLUCOTROL XL  Take 2 tablets (10 mg total) by mouth daily with breakfast.     glucosamine-chondroitin 500-400 MG tablet  Take 1 tablet by mouth 2 (two) times daily.     glucose blood test strip  Commonly known as:  FREESTYLE LITE  Use to check blood sugar once a day dx code 250.00     metFORMIN 500 MG 24 hr tablet  Commonly known as:  GLUCOPHAGE-XR  TAKE FOUR TABLETS BY MOUTH ONCE DAILY     multivitamin tablet  Take 1 tablet by mouth daily. One a Day 50 +     omeprazole 20 MG capsule  Commonly known as:  PRILOSEC  TAKE ONE CAPSULE BY MOUTH ONCE DAILY     pravastatin  40 MG tablet  Commonly known as:  PRAVACHOL  Take 1 tablet (40 mg total) by mouth daily.        Allergies: No Known Allergies  Past Medical History  Diagnosis Date  . Hyperlipidemia     takes Zocor daily  . Hypertension     takes Amlodipine daily  . GERD (gastroesophageal reflux disease)     takes Omeprazole daily  . Diabetes mellitus type II     takes Glipizide-Metformin(Metaglip) and Januvia daily  . Depression     takes Cymbalta daily  . Bronchitis 1977  . Stiffness of hand joint     d/t cervical issues  . Neck pain     C4-7 stenosis and herniated disc  . Scoliosis     slight  . H/O hiatal hernia     Past Surgical History  Procedure Laterality Date  . Colonoscopy  may 2005,     normal, repat in 10 years  . Lymph nodes biopsy    . Melanoma rt calf  1999  . Egd with esophageal dilation  9-08    per Dr. Olevia Perches  . Tonsillectomy      as a child  . Anterior cervical decomp/discectomy fusion  08/18/2011    Procedure: ANTERIOR CERVICAL DECOMPRESSION/DISCECTOMY FUSION 3 LEVELS;  Surgeon: Winfield Cunas, MD;  Location: [place] [facility];  Service: Neurosurgery;  Laterality: N/A;  Anterior Cervical Four-Five/Five-Six/Six-Seven Decompression with Fusion, Plating, and Bonegraft  . Carpal tunnel release  2013    bilateral, per Dr. Christella Noa   . Ulnar tunnel release  2013    right arm, per Dr. Christella Noa   . Spine surgery      Family History  Problem Relation Age of Onset  . Heart disease Father   . Heart disease Brother   . Anesthesia problems Mother   . Diabetes Sister     Social History:  reports that he has never smoked. He has never used smokeless tobacco. He reports that he does not drink alcohol or use illicit drugs.    Review of Systems   Hypertension: He is compliant with his Lotrel. Blood pressure is controlled and also may be benefiting from Invokana      Lipids: Had been on simvastatin from his PCP for the last few years  This  was changed to pravastatin to avoid  interaction with 10 mg amlodipine and he says he is taking this regularly  Lab Results  Component Value Date   CHOL 183 11/18/2013   HDL 30.60* 11/18/2013   LDLCALC 122* 11/18/2013   LDLDIRECT 138.7 08/12/2013   TRIG 153.0* 11/18/2013   CHOLHDL 6 11/18/2013                    Nerve pain present in his left  hand, had cervical spine surgery in 2013 and is still taking some gabapentin     Physical Examination:  BP 145/87  Pulse 70  Temp(Src) 98.3 F (36.8 C)  Resp 16  Ht 5\' 11"  (1.803 m)  Wt 221 lb 6.4 oz (100.426 kg)  BMI 30.89 kg/m2  SpO2 95%   ASSESSMENT:  Diabetes type 2, uncontrolled    The patient has overall fairly good  blood sugar control with starting a 3 drug regimen as above which he is tolerating and compliant with. Now on 5 mg glipizide ER and morning sugars are averaging 147 Again he has not done readings after meals as discussed on his peers visits To have A1c checked today He has been variably compliant with exercise but able to maintain his weight with fairly good diet  HYPERTENSION: Good control now with taking his Lotrel regularly now   Hyperlipidemia: He is on pravastatin and lipids to be checked today  PLAN:    Continue same regimen for now unless he has higher readings or A1c  Start checking blood sugars have the time after meals as directed and bring monitor for download.  Labs today  Followup in 3 months   Nadene Witherspoon 02/10/2014, 11:06 AM   A1c still high at 7.7, target Office Visit on 02/10/2014  Component Date Value Ref Range Status  . Hemoglobin A1C 02/10/2014 7.7* 4.6 - 6.5 % Final   Glycemic Control Guidelines for People with Diabetes:Non Diabetic:  <6%Goal of Therapy: <7%Additional Action Suggested:  >8%   . Sodium 02/10/2014 138  135 - 145 mEq/L Final  . Potassium 02/10/2014 3.8  3.5 - 5.1 mEq/L Final  . Chloride 02/10/2014 104  96 - 112 mEq/L Final  . CO2 02/10/2014 27  19 - 32 mEq/L Final  . Glucose, Bld 02/10/2014 178* 70 - 99  mg/dL Final  . BUN 02/10/2014 16  6 - 23 mg/dL Final  . Creatinine, Ser 02/10/2014 0.6  0.4 - 1.5 mg/dL Final  . Total Bilirubin 02/10/2014 0.8  0.2 - 1.2 mg/dL Final  . Alkaline Phosphatase 02/10/2014 60  39 - 117 U/L Final  . AST 02/10/2014 20  0 - 37 U/L Final  . ALT 02/10/2014 31  0 - 53 U/L Final  . Total Protein 02/10/2014 7.6  6.0 - 8.3 g/dL Final  . Albumin 02/10/2014 4.4  3.5 - 5.2 g/dL Final  . Calcium 02/10/2014 9.6  8.4 - 10.5 mg/dL Final  . GFR 02/10/2014 148.73  >60.00 mL/min Final  . Cholesterol 02/10/2014 119  0 - 200 mg/dL Final   ATP III Classification       Desirable:  < 200 mg/dL               Borderline High:  200 - 239 mg/dL          High:  > = 240 mg/dL  . Triglycerides 02/10/2014 94.0  0.0 - 149.0 mg/dL Final   Normal:  <150 mg/dLBorderline High:  150 - 199 mg/dL  .  HDL 02/10/2014 31.90* >39.00 mg/dL Final  . VLDL 02/10/2014 18.8  0.0 - 40.0 mg/dL Final  . LDL Cholesterol 02/10/2014 68  0 - 99 mg/dL Final  . Total CHOL/HDL Ratio 02/10/2014 4   Final                  Men          Women1/2 Average Risk     3.4          3.3Average Risk          5.0          4.42X Average Risk          9.6          7.13X Average Risk          15.0          11.0                      . NonHDL 02/10/2014 87.10   Final

## 2014-02-11 MED ORDER — SITAGLIP PHOS-METFORMIN HCL ER 50-1000 MG PO TB24: ORAL_TABLET | ORAL | Status: DC

## 2014-02-11 NOTE — Progress Notes (Signed)
Quick Note:  Please let patient know that the A1c is still 7.7, higher than target and needs to change metformin to Janumet XR, prescription has been sent. He can get a co-pay card online or from our office. Need to check more readings after meals ______

## 2014-03-01 ENCOUNTER — Other Ambulatory Visit: Payer: Self-pay | Admitting: Endocrinology

## 2014-03-10 ENCOUNTER — Ambulatory Visit (INDEPENDENT_AMBULATORY_CARE_PROVIDER_SITE_OTHER): Payer: 59 | Admitting: Family Medicine

## 2014-03-10 ENCOUNTER — Encounter: Payer: Self-pay | Admitting: Family Medicine

## 2014-03-10 VITALS — BP 131/76 | HR 73 | Temp 98.3°F | Ht 71.0 in | Wt 220.0 lb

## 2014-03-10 DIAGNOSIS — L600 Ingrowing nail: Secondary | ICD-10-CM

## 2014-03-10 MED ORDER — CEPHALEXIN 500 MG PO CAPS: 500.0000 mg | ORAL_CAPSULE | Freq: Four times a day (QID) | ORAL | Status: DC

## 2014-03-10 NOTE — Progress Notes (Signed)
   Subjective:    Patient ID: Colton Bush, male    DOB: 12-16-1955, 58 y.o.   MRN: [id number]  HPI Here for pain around the right great toenail. This nail has been thick and growing to the side for several years. Now for the past 2 months the skin around it has been tender and draining purulence.    Review of Systems  Constitutional: Negative.   Skin: Positive for color change.       Objective:   Physical Exam  Constitutional: He appears well-developed and well-nourished.  Musculoskeletal:  The right great toenail is extremely thickened and is growing to the medial side. The skin at the nail base is red, purulent, and tender           Assessment & Plan:  Treat the infection with Keflex. Refer to Podiatry to remove the nail

## 2014-03-10 NOTE — Progress Notes (Signed)
Pre visit review using our clinic review tool, if applicable. No additional management support is needed unless otherwise documented below in the visit note. 

## 2014-03-30 ENCOUNTER — Other Ambulatory Visit: Payer: Self-pay | Admitting: *Deleted

## 2014-03-30 ENCOUNTER — Ambulatory Visit: Payer: Self-pay | Admitting: Podiatry

## 2014-03-30 ENCOUNTER — Telehealth: Payer: Self-pay | Admitting: Endocrinology

## 2014-03-30 MED ORDER — GLIPIZIDE ER 5 MG PO TB24: ORAL_TABLET | ORAL | Status: DC

## 2014-03-30 NOTE — Telephone Encounter (Signed)
Okay, glipizide ER 5 mg.

## 2014-03-30 NOTE — Telephone Encounter (Signed)
rx sent

## 2014-03-30 NOTE — Telephone Encounter (Signed)
Patient need refill of Glipizide 5 mg, [facility]

## 2014-05-01 ENCOUNTER — Other Ambulatory Visit: Payer: Self-pay | Admitting: *Deleted

## 2014-05-01 ENCOUNTER — Other Ambulatory Visit (INDEPENDENT_AMBULATORY_CARE_PROVIDER_SITE_OTHER): Payer: 59

## 2014-05-01 DIAGNOSIS — E119 Type 2 diabetes mellitus without complications: Secondary | ICD-10-CM

## 2014-05-01 LAB — COMPREHENSIVE METABOLIC PANEL
ALT: 37 U/L (ref 0–53)
AST: 20 U/L (ref 0–37)
Albumin: 4.4 g/dL (ref 3.5–5.2)
Alkaline Phosphatase: 56 U/L (ref 39–117)
BUN: 22 mg/dL (ref 6–23)
CO2: 24 mEq/L (ref 19–32)
Calcium: 9.4 mg/dL (ref 8.4–10.5)
Chloride: 105 mEq/L (ref 96–112)
Creatinine, Ser: 0.7 mg/dL (ref 0.4–1.5)
GFR: 114.43 mL/min (ref >60.00)
Glucose, Bld: 111 mg/dL — ABNORMAL HIGH (ref 70–99)
Potassium: 3.9 mEq/L (ref 3.5–5.1)
Sodium: 139 mEq/L (ref 135–145)
Total Bilirubin: 0.6 mg/dL (ref 0.2–1.2)
Total Protein: 8.1 g/dL (ref 6.0–8.3)

## 2014-05-01 LAB — HEMOGLOBIN A1C: Hgb A1c MFr Bld: 6.9 % — ABNORMAL HIGH (ref 4.6–6.5)

## 2014-05-05 ENCOUNTER — Other Ambulatory Visit: Payer: Self-pay | Admitting: Endocrinology

## 2014-05-05 ENCOUNTER — Ambulatory Visit (INDEPENDENT_AMBULATORY_CARE_PROVIDER_SITE_OTHER): Payer: 59 | Admitting: Endocrinology

## 2014-05-05 ENCOUNTER — Other Ambulatory Visit: Payer: Self-pay | Admitting: *Deleted

## 2014-05-05 ENCOUNTER — Other Ambulatory Visit: Payer: Self-pay | Admitting: Family Medicine

## 2014-05-05 ENCOUNTER — Encounter: Payer: Self-pay | Admitting: Endocrinology

## 2014-05-05 VITALS — BP 132/84 | HR 76 | Temp 97.7°F | Resp 16 | Ht 71.0 in | Wt 224.8 lb

## 2014-05-05 DIAGNOSIS — E1165 Type 2 diabetes mellitus with hyperglycemia: Secondary | ICD-10-CM

## 2014-05-05 DIAGNOSIS — IMO0001 Reserved for inherently not codable concepts without codable children: Secondary | ICD-10-CM | POA: Insufficient documentation

## 2014-05-05 DIAGNOSIS — E119 Type 2 diabetes mellitus without complications: Secondary | ICD-10-CM

## 2014-05-05 DIAGNOSIS — E782 Mixed hyperlipidemia: Secondary | ICD-10-CM

## 2014-05-05 DIAGNOSIS — E1169 Type 2 diabetes mellitus with other specified complication: Secondary | ICD-10-CM | POA: Insufficient documentation

## 2014-05-05 MED ORDER — CANAGLIFLOZIN 300 MG PO TABS: ORAL_TABLET | ORAL | Status: DC

## 2014-05-05 MED ORDER — GLIPIZIDE ER 5 MG PO TB24: ORAL_TABLET | ORAL | Status: DC

## 2014-05-05 NOTE — Patient Instructions (Signed)
Please check blood sugars at least half the time about 2 hours after any meal and 3 times per week on waking up. Please bring blood sugar monitor to each visit  EXERCISE: Walk when you can, more on weekends

## 2014-05-05 NOTE — Progress Notes (Signed)
Patient ID: Colton Bush, male   DOB: 1955-12-17, 58 y.o.   MRN: [id number]   Reason for Appointment : Followup for Type 2 Diabetes  History of Present Illness          Diagnosis: Type 2 diabetes mellitus, date of diagnosis:1999        Past history: He was probably started on metformin and diagnosis and is still taking this. At some point he was also given glipizide. Apparently his blood sugars have been getting her relatively higher over the years The last couple of years he has had difficulty with affording medication because of disability and not getting insurance He had been on Januvia for the previous 2-3 years with variable control. A1c has been over 7% since 2012 He had significant hyperglycemia occurring since at least 2014 because of difficulty getting his medications  Recent history:  Because of a markedly increased A1c of 10.8 despite taking his glipizide and Janumet in 2/15 he was changed to a regimen of Glipizide ER 10 mg, metformin ER 2000 mg, Invokana 300 mg.  Also had been given Trulicity initially but he stopped taking it on his own  On the blood sugars were not significantly high subsequently his A1c did not come down to a target On his last visit in 7/15 he was given Januvia in combination with his metformin  He was also asked to check blood sugars after meals and so just in the morning and he is still not doing so Also have been quite irregular with exercise as he thinks he has a busy schedule Has gained back a little weight However his A1c has come down with this change and is usually compliant with his medications Usually has been fairly good with diet       Oral hypoglycemic drugs the patient is taking are: Glipizide ER, Invokana and  Janumet XR    Side effects from medications have been: None   Glucose monitoring:  done once a day        Glucometer: Freestyle   Blood Glucose readings: Range 89-145 with average 120; checking only fasting glucose  readings Hypoglycemia: none     Glycemic control:  Lab Results  Component Value Date   HGBA1C 6.9* 05/01/2014   HGBA1C 7.7* 02/10/2014   HGBA1C 7.8* 11/18/2013   Lab Results  Component Value Date   MICROALBUR 19.0* 10/07/2013   LDLCALC 68 02/10/2014   CREATININE 0.7 05/01/2014    Self-care: The diet that the patient has been following is: tries to limit fats.  Breakfast is peanut butter and toast, lunch is a sandwich and dinner is usually baked chicken.     Meals: 3 meals per day. Dinner 6-7 pm         Exercise: 0-3 times per week, 45-60 minutes and evenings at the gym       Dietician visit: 4/15            Retinal exam: Most recent: 07/2013.    Weight history:  Wt Readings from Last 3 Encounters:  05/05/14 224 lb 12.8 oz (101.969 kg)  03/10/14 220 lb (99.791 kg)  02/10/14 221 lb 6.4 oz (100.426 kg)       Medication List       This list is accurate as of: 05/05/14 10:16 AM.  Always use your most recent med list.               amLODipine-benazepril 10-20 MG per capsule  Commonly known as:  LOTREL  TAKE ONE CAPSULE BY MOUTH ONCE DAILY     aspirin 81 MG tablet  Take 81 mg by mouth daily.     Canagliflozin 300 MG Tabs  Commonly known as:  INVOKANA  TAKE ONE TABLET BY MOUTH ONCE DAILY     cephALEXin 500 MG capsule  Commonly known as:  KEFLEX  Take 1 capsule (500 mg total) by mouth 4 (four) times daily.     FISH OIL PO  Take 1 capsule by mouth daily.     freestyle lancets  Use as instructed to check blood sugar once a day dx code 250.00     gabapentin 800 MG tablet  Commonly known as:  NEURONTIN  Take 300 mg by mouth 3 (three) times daily.     glipiZIDE 5 MG 24 hr tablet  Commonly known as:  GLUCOTROL XL  TAKE ONE TABLET BY MOUTH ONCE DAILY WITH BREAKFAST.     glucosamine-chondroitin 500-400 MG tablet  Take 1 tablet by mouth 2 (two) times daily.     glucose blood test strip  Commonly known as:  FREESTYLE LITE  Use to check blood sugar once a day dx code  250.00     multivitamin tablet  Take 1 tablet by mouth daily. One a Day 50 +     omeprazole 20 MG capsule  Commonly known as:  PRILOSEC  TAKE ONE CAPSULE BY MOUTH ONCE DAILY     pravastatin 40 MG tablet  Commonly known as:  PRAVACHOL  Take 1 tablet (40 mg total) by mouth daily.     SitaGLIPtin-MetFORMIN HCl 50-1000 MG Tb24  2 tablets once a day        Allergies: No Known Allergies  Past Medical History  Diagnosis Date  . Hyperlipidemia     takes Zocor daily  . Hypertension     takes Amlodipine daily  . GERD (gastroesophageal reflux disease)     takes Omeprazole daily  . Depression     takes Cymbalta daily  . Bronchitis 1977  . Stiffness of hand joint     d/t cervical issues  . Neck pain     C4-7 stenosis and herniated disc  . Scoliosis     slight  . H/O hiatal hernia   . Diabetes mellitus type II     sees [name]     Past Surgical History  Procedure Laterality Date  . Colonoscopy  12-14-03    per Dr. Olevia Perches, clear, repeat in 10 yrs   . Lymph nodes biopsy    . Melanoma rt calf  1999  . Egd with esophageal dilation  9-08    per Dr. Olevia Perches  . Tonsillectomy      as a child  . Anterior cervical decomp/discectomy fusion  08/18/2011    Procedure: ANTERIOR CERVICAL DECOMPRESSION/DISCECTOMY FUSION 3 LEVELS;  Surgeon: Winfield Cunas, MD;  Location: [place] [facility];  Service: Neurosurgery;  Laterality: N/A;  Anterior Cervical Four-Five/Five-Six/Six-Seven Decompression with Fusion, Plating, and Bonegraft  . Carpal tunnel release  2013    bilateral, per Dr. Christella Noa   . Ulnar tunnel release  2013    right arm, per Dr. Christella Noa   . Spine surgery      Family History  Problem Relation Age of Onset  . Heart disease Father   . Heart disease Brother   . Anesthesia problems Mother   . Diabetes Sister     Social History:  reports that he has never smoked. He has never used smokeless tobacco.  He reports that he does not drink alcohol or use illicit drugs.    Review of  Systems   Hypertension: He is compliant with his Lotrel. Blood pressure is fairly well controlled and also may be benefiting from Invokana      Lipids:    Simvastatin was changed to pravastatin to avoid interaction with 10 mg amlodipine and he says he is taking this regularly with good results.  Lab Results  Component Value Date   CHOL 119 02/10/2014   HDL 31.90* 02/10/2014   LDLCALC 68 02/10/2014   LDLDIRECT 138.7 08/12/2013   TRIG 94.0 02/10/2014   CHOLHDL 4 02/10/2014                    Nerve pain present in his left  hand, had cervical spine surgery in 2013 and is still taking some gabapentin     Physical Examination:  BP 132/84  Pulse 76  Temp(Src) 97.7 F (36.5 C)  Resp 16  Ht 5\' 11"  (1.803 m)  Wt 224 lb 12.8 oz (101.969 kg)  BMI 31.37 kg/m2  SpO2 97%   ASSESSMENT:  Diabetes type 2, uncontrolled    The patient has overall fairly good  blood sugar control with starting Januvia in addition to his previous regimen Had no side effects with either metformin or Invokana He has been less compliant with his exercise regimen and discussed importance of this Again he has not done any blood sugar readings after meals as discussed on his previous visits and this was emphasized again  HYPERTENSION: Good control now with taking his Lotrel regularly now   Hyperlipidemia: He is on pravastatin and lipids are under control  PLAN:    Continue same regimen for now    Start checking blood sugars at least half time after meals as directed and bring monitor for download.  Restart exercise   Followup in 3 months   Elonda Giuliano 05/05/2014, 10:16 AM      Appointment on 05/01/2014  Component Date Value Ref Range Status  . Hemoglobin A1C 05/01/2014 6.9* 4.6 - 6.5 % Final   Glycemic Control Guidelines for People with Diabetes:Non Diabetic:  <6%Goal of Therapy: <7%Additional Action Suggested:  >8%   . Sodium 05/01/2014 139  135 - 145 mEq/L Final  . Potassium 05/01/2014 3.9  3.5 -  5.1 mEq/L Final  . Chloride 05/01/2014 105  96 - 112 mEq/L Final  . CO2 05/01/2014 24  19 - 32 mEq/L Final  . Glucose, Bld 05/01/2014 111* 70 - 99 mg/dL Final  . BUN 05/01/2014 22  6 - 23 mg/dL Final  . Creatinine, Ser 05/01/2014 0.7  0.4 - 1.5 mg/dL Final  . Total Bilirubin 05/01/2014 0.6  0.2 - 1.2 mg/dL Final  . Alkaline Phosphatase 05/01/2014 56  39 - 117 U/L Final  . AST 05/01/2014 20  0 - 37 U/L Final  . ALT 05/01/2014 37  0 - 53 U/L Final  . Total Protein 05/01/2014 8.1  6.0 - 8.3 g/dL Final  . Albumin 05/01/2014 4.4  3.5 - 5.2 g/dL Final  . Calcium 05/01/2014 9.4  8.4 - 10.5 mg/dL Final  . GFR 05/01/2014 114.43  >60.00 mL/min Final

## 2014-07-09 ENCOUNTER — Other Ambulatory Visit: Payer: Self-pay | Admitting: Endocrinology

## 2014-07-17 ENCOUNTER — Other Ambulatory Visit: Payer: Self-pay | Admitting: Endocrinology

## 2014-07-26 ENCOUNTER — Other Ambulatory Visit: Payer: Self-pay | Admitting: Endocrinology

## 2014-08-06 ENCOUNTER — Other Ambulatory Visit (INDEPENDENT_AMBULATORY_CARE_PROVIDER_SITE_OTHER): Payer: Self-pay

## 2014-08-06 ENCOUNTER — Other Ambulatory Visit: Payer: Self-pay | Admitting: Family Medicine

## 2014-08-06 DIAGNOSIS — E119 Type 2 diabetes mellitus without complications: Secondary | ICD-10-CM

## 2014-08-06 LAB — BASIC METABOLIC PANEL
BUN: 19 mg/dL (ref 6–23)
CO2: 27 mEq/L (ref 19–32)
Calcium: 9.6 mg/dL (ref 8.4–10.5)
Chloride: 107 mEq/L (ref 96–112)
Creatinine, Ser: 0.8 mg/dL (ref 0.4–1.5)
GFR: 112.57 mL/min (ref >60.00)
Glucose, Bld: 145 mg/dL — ABNORMAL HIGH (ref 70–99)
Potassium: 4.5 mEq/L (ref 3.5–5.1)
Sodium: 141 mEq/L (ref 135–145)

## 2014-08-06 LAB — MICROALBUMIN / CREATININE URINE RATIO
Creatinine,U: 68.8 mg/dL
Microalb Creat Ratio: 12.3 mg/g (ref 0.0–30.0)
Microalb, Ur: 8.5 mg/dL — ABNORMAL HIGH (ref 0.0–1.9)

## 2014-08-06 LAB — HEMOGLOBIN A1C: Hgb A1c MFr Bld: 7.2 % — ABNORMAL HIGH (ref 4.6–6.5)

## 2014-08-11 ENCOUNTER — Encounter: Payer: Self-pay | Admitting: Endocrinology

## 2014-08-11 ENCOUNTER — Ambulatory Visit (INDEPENDENT_AMBULATORY_CARE_PROVIDER_SITE_OTHER): Payer: 59 | Admitting: Endocrinology

## 2014-08-11 VITALS — BP 122/74 | HR 84 | Temp 98.0°F | Resp 14 | Ht 71.0 in | Wt 231.8 lb

## 2014-08-11 DIAGNOSIS — E1165 Type 2 diabetes mellitus with hyperglycemia: Secondary | ICD-10-CM

## 2014-08-11 DIAGNOSIS — I10 Essential (primary) hypertension: Secondary | ICD-10-CM

## 2014-08-11 DIAGNOSIS — IMO0002 Reserved for concepts with insufficient information to code with codable children: Secondary | ICD-10-CM

## 2014-08-11 NOTE — Patient Instructions (Addendum)
Please check blood sugars at least half the time about 2 hours after any meal and times per week on waking up. Please bring blood sugar monitor to each visit  Walk regularly  Call if sugars > 180-200 after meals

## 2014-08-11 NOTE — Progress Notes (Signed)
Patient ID: Colton Bush, male   DOB: 1955-10-15, 59 y.o.   MRN: [id number]   Reason for Appointment : Followup for Type 2 Diabetes  History of Present Illness          Diagnosis: Type 2 diabetes mellitus, date of diagnosis:1999        Past history: He was probably started on metformin and diagnosis and is still taking this. At some point he was also given glipizide. Apparently his blood sugars have been getting her relatively higher over the years The last couple of years he has had difficulty with affording medication because of disability and not getting insurance He had been on Januvia for the previous 2-3 years with variable control. A1c has been over 7% since 2012 He had significant hyperglycemia occurring since at least 2014 because of difficulty getting his medications Because of a markedly increased A1c of 10.8 despite taking his glipizide and Janumet in 2/15 he was changed to a regimen of Glipizide ER 10 mg, metformin ER 2000 mg, Invokana 300 mg. Also had been given Trulicity initially but he stopped taking it on his own    Recent history:  On his visit in 7/15 he was given Januvia in combination with his metformin With this his A1c has been right around 7% and he continues to take a 4 drug regimen However he has gained weight since his last visit and has not been walking as before He was also asked to check blood sugars after meals since he is doing readings only in the morning; still has only fasting readings He thinks he gets snacks in the evenings after dinner and does not think he can get a postprandial reading As expected his A1c has gone up slightly However his A1c has come down with this change and is usually compliant with his medications Usually has been fairly good with diet       Oral hypoglycemic drugs the patient is taking are: Glipizide ER, Invokana, Januvia and  Janumet XR    Side effects from medications have been: None   Glucose monitoring:  done once a day         Glucometer: Freestyle   Blood Glucose readings: Range 89-157 with average 121; checking only fasting glucose readings Hypoglycemia: none     Glycemic control:  Lab Results  Component Value Date   HGBA1C 7.2* 08/06/2014   HGBA1C 6.9* 05/01/2014   HGBA1C 7.7* 02/10/2014   Lab Results  Component Value Date   MICROALBUR 8.5* 08/06/2014   LDLCALC 68 02/10/2014   CREATININE 0.8 08/06/2014    Self-care: The diet that the patient has been following is: tries to limit fats.  Breakfast is peanut butter and toast, lunch is a sandwich and dinner is usually baked chicken.     Meals: 3 meals per day. Dinner 6-7 pm         Exercise: none now     Dietician visit: 4/15            Retinal exam: Most recent: 07/2013.    Weight history:  Wt Readings from Last 3 Encounters:  08/11/14 231 lb 12.8 oz (105.144 kg)  05/05/14 224 lb 12.8 oz (101.969 kg)  03/10/14 220 lb (99.791 kg)       Medication List       This list is accurate as of: 08/11/14 11:08 AM.  Always use your most recent med list.  amLODipine-benazepril 10-20 MG per capsule  Commonly known as:  LOTREL  TAKE 1 CAPSULE EVERY DAY     aspirin 81 MG tablet  Take 81 mg by mouth daily.     canagliflozin 300 MG Tabs tablet  Commonly known as:  INVOKANA  TAKE ONE TABLET BY MOUTH ONCE DAILY     cephALEXin 500 MG capsule  Commonly known as:  KEFLEX  Take 1 capsule (500 mg total) by mouth 4 (four) times daily.     FISH OIL PO  Take 1 capsule by mouth daily.     freestyle lancets  Use as instructed to check blood sugar once a day dx code 250.00     FREESTYLE LITE test strip  Generic drug:  glucose blood  USE ONE STRIP TO CHECK BLOOD SUGAR ONCE DAILY.     gabapentin 800 MG tablet  Commonly known as:  NEURONTIN  Take 300 mg by mouth 3 (three) times daily.     glipiZIDE 5 MG 24 hr tablet  Commonly known as:  GLUCOTROL XL  TAKE ONE TABLET BY MOUTH ONCE DAILY WITH BREAKFAST.     glucosamine-chondroitin  500-400 MG tablet  Take 1 tablet by mouth 2 (two) times daily.     JANUMET XR 50-1000 MG Tb24  Generic drug:  SitaGLIPtin-MetFORMIN HCl  TAKE 2 TABLETS BY MOUTH EVERY DAY     multivitamin tablet  Take 1 tablet by mouth daily. One a Day 50 +     omeprazole 20 MG capsule  Commonly known as:  PRILOSEC  TAKE ONE CAPSULE BY MOUTH ONCE DAILY     pravastatin 40 MG tablet  Commonly known as:  PRAVACHOL  TAKE 1 TABLET (40 MG TOTAL) BY MOUTH DAILY.        Allergies: No Known Allergies  Past Medical History  Diagnosis Date  . Hyperlipidemia     takes Zocor daily  . Hypertension     takes Amlodipine daily  . GERD (gastroesophageal reflux disease)     takes Omeprazole daily  . Depression     takes Cymbalta daily  . Bronchitis 1977  . Stiffness of hand joint     d/t cervical issues  . Neck pain     C4-7 stenosis and herniated disc  . Scoliosis     slight  . H/O hiatal hernia   . Diabetes mellitus type II     sees [name]     Past Surgical History  Procedure Laterality Date  . Colonoscopy  12-14-03    per Dr. Olevia Perches, clear, repeat in 10 yrs   . Lymph nodes biopsy    . Melanoma rt calf  1999  . Egd with esophageal dilation  9-08    per Dr. Olevia Perches  . Tonsillectomy      as a child  . Anterior cervical decomp/discectomy fusion  08/18/2011    Procedure: ANTERIOR CERVICAL DECOMPRESSION/DISCECTOMY FUSION 3 LEVELS;  Surgeon: Winfield Cunas, MD;  Location: [facility];  Service: Neurosurgery;  Laterality: N/A;  Anterior Cervical Four-Five/Five-Six/Six-Seven Decompression with Fusion, Plating, and Bonegraft  . Carpal tunnel release  2013    bilateral, per Dr. Christella Noa   . Ulnar tunnel release  2013    right arm, per Dr. Christella Noa   . Spine surgery      Family History  Problem Relation Age of Onset  . Heart disease Father   . Heart disease Brother 30  . Anesthesia problems Mother   . Diabetes Sister  Social History:  reports that he has never smoked. He has never used  smokeless tobacco. He reports that he does not drink alcohol or use illicit drugs.    Review of Systems   Hypertension: He is compliant with his Lotrel. Blood pressure is fairly well controlled and also may be benefiting from Invokana  Lipids:     Simvastatin was changed to pravastatin to avoid interaction with 10 mg amlodipine   Lab Results  Component Value Date   CHOL 119 02/10/2014   HDL 31.90* 02/10/2014   LDLCALC 68 02/10/2014   LDLDIRECT 138.7 08/12/2013   TRIG 94.0 02/10/2014   CHOLHDL 4 02/10/2014                     Physical Examination:  BP 145/85 mmHg  Pulse 84  Temp(Src) 98 F (36.7 C)  Resp 14  Ht 5\' 11"  (1.803 m)  Wt 231 lb 12.8 oz (105.144 kg)  BMI 32.34 kg/m2  SpO2 98%  Repeat blood pressure 122/74 No pedal edema  ASSESSMENT/PLAN:   Diabetes type 2, uncontrolled    The patient has somewhat worsening of her diabetes control with poor diet and decreased exercise since the last visit Again he does not understand the need for checking blood sugars after meals and is not motivated to do better since he thinks fasting readings are fairly good Discussed again the benefits of Trulicity compared to Januvia but he is reluctant to change; he thinks if he tries to lose weight on his own he can reduce his medications Discussed progression of diabetes which he has had for 16 years now Emphasized the need for postprandial monitoring regardless of time a day to help adjust his medication regimen and help him watch his diet better Encouraged him to start walking whenever he can for exercise Consider switching to Trulicity on the next visit if he has not made any progress  No significant diabetic medications currently and microalbumin ratio is normal  HYPERTENSION: Good control now with taking his Lotrel as before  Hyperlipidemia: He is on pravastatin and lipids need to be checked again on the next visit; he is also due for follow-up with his  PCP   [facility] 08/11/2014, 11:08 AM      Appointment on 08/06/2014  Component Date Value Ref Range Status  . Hgb A1c MFr Bld 08/06/2014 7.2* 4.6 - 6.5 % Final   Glycemic Control Guidelines for People with Diabetes:Non Diabetic:  <6%Goal of Therapy: <7%Additional Action Suggested:  >8%   . Sodium 08/06/2014 141  135 - 145 mEq/L Final  . Potassium 08/06/2014 4.5  3.5 - 5.1 mEq/L Final  . Chloride 08/06/2014 107  96 - 112 mEq/L Final  . CO2 08/06/2014 27  19 - 32 mEq/L Final  . Glucose, Bld 08/06/2014 145* 70 - 99 mg/dL Final  . BUN 08/06/2014 19  6 - 23 mg/dL Final  . Creatinine, Ser 08/06/2014 0.8  0.4 - 1.5 mg/dL Final  . Calcium 08/06/2014 9.6  8.4 - 10.5 mg/dL Final  . GFR 08/06/2014 112.57  >60.00 mL/min Final  . Microalb, Ur 08/06/2014 8.5* 0.0 - 1.9 mg/dL Final  . Creatinine,U 08/06/2014 68.8   Final  . Microalb Creat Ratio 08/06/2014 12.3  0.0 - 30.0 mg/g Final

## 2014-08-12 ENCOUNTER — Other Ambulatory Visit: Payer: Self-pay | Admitting: Endocrinology

## 2014-08-14 ENCOUNTER — Other Ambulatory Visit: Payer: Self-pay | Admitting: *Deleted

## 2014-08-14 MED ORDER — EMPAGLIFLOZIN 25 MG PO TABS: 25.0000 mg | ORAL_TABLET | Freq: Every day | ORAL | Status: DC

## 2014-08-27 ENCOUNTER — Other Ambulatory Visit: Payer: Self-pay | Admitting: Endocrinology

## 2014-09-18 ENCOUNTER — Other Ambulatory Visit: Payer: Self-pay | Admitting: Family Medicine

## 2014-09-22 ENCOUNTER — Encounter: Payer: Self-pay | Admitting: Family Medicine

## 2014-09-22 ENCOUNTER — Ambulatory Visit (INDEPENDENT_AMBULATORY_CARE_PROVIDER_SITE_OTHER): Payer: 59 | Admitting: Family Medicine

## 2014-09-22 VITALS — BP 147/85 | HR 72 | Temp 98.0°F | Ht 71.0 in | Wt 229.0 lb

## 2014-09-22 DIAGNOSIS — I10 Essential (primary) hypertension: Secondary | ICD-10-CM

## 2014-09-22 DIAGNOSIS — Z0000 Encounter for general adult medical examination without abnormal findings: Secondary | ICD-10-CM

## 2014-09-22 DIAGNOSIS — L989 Disorder of the skin and subcutaneous tissue, unspecified: Secondary | ICD-10-CM

## 2014-09-22 LAB — POCT URINALYSIS DIPSTICK
Bilirubin, UA: NEGATIVE
Blood, UA: 5
Ketones, UA: NEGATIVE
Leukocytes, UA: NEGATIVE
Nitrite, UA: NEGATIVE
Spec Grav, UA: 1.01
Urobilinogen, UA: 0.2
pH, UA: 5

## 2014-09-22 LAB — CBC WITH DIFFERENTIAL/PLATELET
Basophils Absolute: 0.1 10*3/uL (ref 0.0–0.1)
Basophils Relative: 1.1 % (ref 0.0–3.0)
Eosinophils Absolute: 0.3 10*3/uL (ref 0.0–0.7)
Eosinophils Relative: 3.1 % (ref 0.0–5.0)
HCT: 45.2 % (ref 39.0–52.0)
Hemoglobin: 14.9 g/dL (ref 13.0–17.0)
Lymphocytes Relative: 23.1 % (ref 12.0–46.0)
Lymphs Abs: 2.4 10*3/uL (ref 0.7–4.0)
MCHC: 33 g/dL (ref 30.0–36.0)
MCV: 85.6 fl (ref 78.0–100.0)
Monocytes Absolute: 0.9 10*3/uL (ref 0.1–1.0)
Monocytes Relative: 8.6 % (ref 3.0–12.0)
Neutro Abs: 6.6 10*3/uL (ref 1.4–7.7)
Neutrophils Relative %: 64.1 % (ref 43.0–77.0)
Platelets: 322 10*3/uL (ref 150.0–400.0)
RBC: 5.28 Mil/uL (ref 4.22–5.81)
RDW: 14.6 % (ref 11.5–15.5)
WBC: 10.3 10*3/uL (ref 4.0–10.5)

## 2014-09-22 LAB — HEPATIC FUNCTION PANEL
ALT: 36 U/L (ref 0–53)
AST: 23 U/L (ref 0–37)
Albumin: 4.8 g/dL (ref 3.5–5.2)
Alkaline Phosphatase: 65 U/L (ref 39–117)
Bilirubin, Direct: 0.1 mg/dL (ref 0.0–0.3)
Total Bilirubin: 0.6 mg/dL (ref 0.2–1.2)
Total Protein: 8.1 g/dL (ref 6.0–8.3)

## 2014-09-22 LAB — BASIC METABOLIC PANEL
BUN: 17 mg/dL (ref 6–23)
CO2: 30 mEq/L (ref 19–32)
Calcium: 10.3 mg/dL (ref 8.4–10.5)
Chloride: 104 mEq/L (ref 96–112)
Creatinine, Ser: 0.8 mg/dL (ref 0.40–1.50)
GFR: 104.45 mL/min (ref >60.00)
Glucose, Bld: 132 mg/dL — ABNORMAL HIGH (ref 70–99)
Potassium: 5.3 mEq/L — ABNORMAL HIGH (ref 3.5–5.1)
Sodium: 140 mEq/L (ref 135–145)

## 2014-09-22 LAB — LIPID PANEL
Cholesterol: 133 mg/dL (ref 0–200)
HDL: 37.6 mg/dL — ABNORMAL LOW (ref >39.00)
LDL Cholesterol: 76 mg/dL (ref 0–99)
NonHDL: 95.4
Total CHOL/HDL Ratio: 4
Triglycerides: 95 mg/dL (ref 0.0–149.0)
VLDL: 19 mg/dL (ref 0.0–40.0)

## 2014-09-22 LAB — TSH: TSH: 1.49 u[IU]/mL (ref 0.35–4.50)

## 2014-09-22 LAB — PSA: PSA: 1.01 ng/mL (ref 0.10–4.00)

## 2014-09-22 LAB — HM DIABETES EYE EXAM

## 2014-09-22 NOTE — Progress Notes (Signed)
   Subjective:    Patient ID: Colton Bush, male    DOB: Feb 21, 1956, 59 y.o.   MRN: [id number]  HPI 59 yr old male for a cpx. He feels well. He still sees [name] for care of his diabetes, and his last A1c was 7.2. He asks me to check a lesion that appeared on the left cheek about 6 months ago.    Review of Systems  Constitutional: Negative.   HENT: Negative.   Eyes: Negative.   Respiratory: Negative.   Cardiovascular: Negative.   Gastrointestinal: Negative.   Genitourinary: Negative.   Musculoskeletal: Negative.   Skin: Negative.   Neurological: Negative.   Psychiatric/Behavioral: Negative.        Objective:   Physical Exam  Constitutional: He is oriented to person, place, and time. He appears well-developed and well-nourished. No distress.  HENT:  Head: Normocephalic and atraumatic.  Right Ear: External ear normal.  Left Ear: External ear normal.  Nose: Nose normal.  Mouth/Throat: Oropharynx is clear and moist. No oropharyngeal exudate.  Eyes: Conjunctivae and EOM are normal. Pupils are equal, round, and reactive to light. Right eye exhibits no discharge. Left eye exhibits no discharge. No scleral icterus.  Neck: Neck supple. No JVD present. No tracheal deviation present. No thyromegaly present.  Cardiovascular: Normal rate, regular rhythm, normal heart sounds and intact distal pulses.  Exam reveals no gallop and no friction rub.   No murmur heard. EKG normal   Pulmonary/Chest: Effort normal and breath sounds normal. No respiratory distress. He has no wheezes. He has no rales. He exhibits no tenderness.  Abdominal: Soft. Bowel sounds are normal. He exhibits no distension and no mass. There is no tenderness. There is no rebound and no guarding.  Genitourinary: Rectum normal, prostate normal and penis normal. Guaiac negative stool. No penile tenderness.  Musculoskeletal: Normal range of motion. He exhibits no edema or tenderness.  Lymphadenopathy:    He has no cervical  adenopathy.  Neurological: He is alert and oriented to person, place, and time. He has normal reflexes. No cranial nerve deficit. He exhibits normal muscle tone. Coordination normal.  Skin: Skin is warm and dry. No rash noted. He is not diaphoretic. No erythema. No pallor.  The left cheek has a raised velvety tan lesion  Psychiatric: He has a normal mood and affect. His behavior is normal. Judgment and thought content normal.          Assessment & Plan:  Well exam. Get fasting labs. Refer to Dermatology for the skin lesion. He is past due for a colonoscopy so we will set this up soon.

## 2014-09-22 NOTE — Progress Notes (Signed)
Pre visit review using our clinic review tool, if applicable. No additional management support is needed unless otherwise documented below in the visit note. 

## 2014-10-08 ENCOUNTER — Ambulatory Visit (AMBULATORY_SURGERY_CENTER): Payer: Self-pay | Admitting: *Deleted

## 2014-10-08 ENCOUNTER — Ambulatory Visit (INDEPENDENT_AMBULATORY_CARE_PROVIDER_SITE_OTHER): Payer: Self-pay | Admitting: Family Medicine

## 2014-10-08 VITALS — BP 136/78 | HR 80 | Temp 98.0°F | Resp 16 | Ht 68.5 in | Wt 232.0 lb

## 2014-10-08 VITALS — Ht 69.5 in | Wt 233.4 lb

## 2014-10-08 DIAGNOSIS — Z024 Encounter for examination for driving license: Secondary | ICD-10-CM

## 2014-10-08 DIAGNOSIS — Z1211 Encounter for screening for malignant neoplasm of colon: Secondary | ICD-10-CM

## 2014-10-08 DIAGNOSIS — Z029 Encounter for administrative examinations, unspecified: Secondary | ICD-10-CM

## 2014-10-08 MED ORDER — MOVIPREP 100 G PO SOLR
1.0000 | Freq: Once | ORAL | Status: DC
Start: 2014-10-08 — End: 2014-10-30

## 2014-10-08 NOTE — Patient Instructions (Signed)
Advise cleaning ears out with Debrox or Murine drops.  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

## 2014-10-08 NOTE — Progress Notes (Signed)
No home 02 use, no cpap No diet pills No egg or soy allergy No issues with past sedation

## 2014-10-08 NOTE — Progress Notes (Signed)
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 He does go to his regular doctor for his other care. He has had his physical. He sees his diabetic specialist. He sees his eye doctor. His recent hemoglobin A1c was 7.2. He has taken his medications regularly. His diabetes has been well-controlled. No other major complaints. No history of hypoglycemic spells.  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 He has to stay in the truck all day. He is married, has 1 son. Lives in [place]. Does not drink alcohol. Does not smoke. Does not use drugs  Review of systems: No acute concerns  Physical exam: Healthy-appearing man, a little overweight, no major distress. His TMs are both occluded by cerumen. Throat clear. Has had dental work and has a full upper plate and partial bottom plate. Neck supple without nodes. Chest is clear to auscultation. Heart regular without murmurs. Evidence of mass or tenderness. No nodes were noted. Normal male external genitalia with testes descended. Strategies unremarkable. Skin has a few excoriations on his right leg where he scratched himself.  Assessment: DOT physical exam Diabetes Hypertension Hyperlipidemia Skin excoriations History of cervical surgery Cerumen in ears  Plan: Recommend clean his ears out with some Debrox. Otherwise he looks good. One year card due to the diabetes

## 2014-10-15 ENCOUNTER — Other Ambulatory Visit: Payer: Self-pay | Admitting: Endocrinology

## 2014-10-15 NOTE — Telephone Encounter (Signed)
Refill done.  

## 2014-10-18 ENCOUNTER — Other Ambulatory Visit: Payer: Self-pay | Admitting: Endocrinology

## 2014-10-19 ENCOUNTER — Encounter: Payer: Self-pay | Admitting: Family Medicine

## 2014-10-26 ENCOUNTER — Other Ambulatory Visit: Payer: Self-pay | Admitting: Endocrinology

## 2014-10-30 ENCOUNTER — Encounter: Payer: Self-pay | Admitting: Internal Medicine

## 2014-10-30 ENCOUNTER — Ambulatory Visit (AMBULATORY_SURGERY_CENTER): Payer: 59 | Admitting: Internal Medicine

## 2014-10-30 VITALS — BP 142/78 | HR 76 | Temp 97.4°F | Resp 18 | Ht 69.5 in | Wt 233.0 lb

## 2014-10-30 DIAGNOSIS — Z1211 Encounter for screening for malignant neoplasm of colon: Secondary | ICD-10-CM

## 2014-10-30 HISTORY — PX: COLONOSCOPY: SHX174

## 2014-10-30 LAB — GLUCOSE, CAPILLARY
Glucose-Capillary: 105 mg/dL — ABNORMAL HIGH (ref 70–99)
Glucose-Capillary: 89 mg/dL (ref 70–99)

## 2014-10-30 MED ORDER — SODIUM CHLORIDE 0.9 % IV SOLN: 500.0000 mL | INTRAVENOUS | Status: DC

## 2014-10-30 NOTE — Patient Instructions (Signed)
YOU HAD AN ENDOSCOPIC PROCEDURE TODAY AT THE [facility]:   Refer to the procedure report that was given to you for any specific questions about what was found during the examination.  If the procedure report does not answer your questions, please call your gastroenterologist to clarify.  If you requested that your care partner not be given the details of your procedure findings, then the procedure report has been included in a sealed envelope for you to review at your convenience later.  YOU SHOULD EXPECT: Some feelings of bloating in the abdomen. Passage of more gas than usual.  Walking can help get rid of the air that was put into your GI tract during the procedure and reduce the bloating. If you had a lower endoscopy (such as a colonoscopy or flexible sigmoidoscopy) you may notice spotting of blood in your stool or on the toilet paper. If you underwent a bowel prep for your procedure, you may not have a normal bowel movement for a few days.  Please Note:  You might notice some irritation and congestion in your nose or some drainage.  This is from the oxygen used during your procedure.  There is no need for concern and it should clear up in a day or so.  SYMPTOMS TO REPORT IMMEDIATELY:   Following lower endoscopy (colonoscopy or flexible sigmoidoscopy):  Excessive amounts of blood in the stool  Significant tenderness or worsening of abdominal pains  Swelling of the abdomen that is new, acute  Fever of 100F or higher   For urgent or emergent issues, a gastroenterologist can be reached at any hour by calling [phone].   DIET: Your first meal following the procedure should be a small meal and then it is ok to progress to your normal diet. Heavy or fried foods are harder to digest and may make you feel nauseous or bloated.  Likewise, meals heavy in dairy and vegetables can increase bloating.  Drink plenty of fluids but you should avoid alcoholic beverages for 24  hours.  ACTIVITY:  You should plan to take it easy for the rest of today and you should NOT DRIVE or use heavy machinery until tomorrow (because of the sedation medicines used during the test).    FOLLOW UP: Our staff will call the number listed on your records the next business day following your procedure to check on you and address any questions or concerns that you may have regarding the information given to you following your procedure. If we do not reach you, we will leave a message.  However, if you are feeling well and you are not experiencing any problems, there is no need to return our call.  We will assume that you have returned to your regular daily activities without incident.  If any biopsies were taken you will be contacted by phone or by letter within the next 1-3 weeks.  Please call us at [phone] if you have not heard about the biopsies in 3 weeks.    SIGNATURES/CONFIDENTIALITY: You and/or your care partner have signed paperwork which will be entered into your electronic medical record.  These signatures attest to the fact that that the information above on your After Visit Summary has been reviewed and is understood.  Full responsibility of the confidentiality of this discharge information lies with you and/or your care-partner. 

## 2014-10-30 NOTE — Op Note (Signed)
[facility]  [address] [place], [id number]   COLONOSCOPY PROCEDURE REPORT  PATIENT: Colton Bush, Colton Bush  MR#: [id number] BIRTHDATE: March 08, 1956 , 67  yrs. old GENDER: male ENDOSCOPIST: Lafayette Dragon, MD REFERRED [id number] Raymon Mutton, M.D. PROCEDURE DATE:  10/30/2014 PROCEDURE:   Colonoscopy, screening First Screening Colonoscopy - Avg.  risk and is 50 yrs.  old or older - No.  Prior Negative Screening - Now for repeat screening. 10 or more years since last screening  History of Adenoma - Now for follow-up colonoscopy & has been > or = to 3 yrs.  N/A ASA CLASS:   Class II INDICATIONS:Screening for colonic neoplasia and Colorectal Neoplasm Risk Assessment for this procedure is average risk. MEDICATIONS: Propofol 300 mg IV  DESCRIPTION OF PROCEDURE:   After the risks benefits and alternatives of the procedure were thoroughly explained, informed consent was obtained.  The digital rectal exam revealed no abnormalities of the rectum.   The [id number]  endoscope was introduced through the anus and advanced to the cecum, which was identified by both the appendix and ileocecal valve. No adverse events experienced.   The quality of the prep was good.  (MoviPrep was used)  The instrument was then slowly withdrawn as the colon was fully examined.      COLON FINDINGS: A normal appearing cecum, ileocecal valve, and appendiceal orifice were identified.  The ascending, transverse, descending, sigmoid colon, and rectum appeared unremarkable. Retroflexed views revealed no abnormalities. The time to cecum = 9.39 Withdrawal time = 6.23   The scope was withdrawn and the procedure completed. COMPLICATIONS: There were no immediate complications.  ENDOSCOPIC IMPRESSION: Normal colonoscopy  RECOMMENDATIONS: High fiber diet Recall colonoscopy in 10 years  eSigned:  Lafayette Dragon, MD 10/30/2014 12:01 PM   cc:

## 2014-10-30 NOTE — Progress Notes (Signed)
Report to PACU, RN, vss, BBS= Clear.  

## 2014-11-02 ENCOUNTER — Telehealth: Payer: Self-pay | Admitting: *Deleted

## 2014-11-02 NOTE — Telephone Encounter (Signed)
  Follow up Call-  Call back number 10/30/2014  Post procedure Call Back phone  # [phone]  Permission to leave phone message Yes     Patient questions:  Do you have a fever, pain , or abdominal swelling? No. Pain Score  0 *  Have you tolerated food without any problems? Yes.    Have you been able to return to your normal activities? Yes.    Do you have any questions about your discharge instructions: Diet   No. Medications  No. Follow up visit  No.  Do you have questions or concerns about your Care? No.  Actions: * If pain score is 4 or above: No action needed, pain <4.

## 2014-11-10 ENCOUNTER — Other Ambulatory Visit: Payer: Self-pay | Admitting: Family Medicine

## 2014-11-11 ENCOUNTER — Other Ambulatory Visit: Payer: Self-pay

## 2014-11-12 ENCOUNTER — Encounter: Payer: Self-pay | Admitting: Internal Medicine

## 2014-11-17 ENCOUNTER — Ambulatory Visit: Payer: Self-pay | Admitting: Endocrinology

## 2014-11-19 ENCOUNTER — Ambulatory Visit (INDEPENDENT_AMBULATORY_CARE_PROVIDER_SITE_OTHER): Payer: 59 | Admitting: Endocrinology

## 2014-11-19 ENCOUNTER — Other Ambulatory Visit (INDEPENDENT_AMBULATORY_CARE_PROVIDER_SITE_OTHER): Payer: 59

## 2014-11-19 ENCOUNTER — Encounter: Payer: Self-pay | Admitting: Endocrinology

## 2014-11-19 VITALS — BP 132/78 | HR 96 | Temp 97.9°F | Resp 14 | Ht 68.5 in | Wt 228.4 lb

## 2014-11-19 DIAGNOSIS — IMO0002 Reserved for concepts with insufficient information to code with codable children: Secondary | ICD-10-CM

## 2014-11-19 DIAGNOSIS — E1165 Type 2 diabetes mellitus with hyperglycemia: Secondary | ICD-10-CM

## 2014-11-19 DIAGNOSIS — I10 Essential (primary) hypertension: Secondary | ICD-10-CM | POA: Diagnosis not present

## 2014-11-19 LAB — BASIC METABOLIC PANEL
BUN: 20 mg/dL (ref 6–23)
CO2: 23 mEq/L (ref 19–32)
Calcium: 10.1 mg/dL (ref 8.4–10.5)
Chloride: 107 mEq/L (ref 96–112)
Creatinine, Ser: 0.76 mg/dL (ref 0.40–1.50)
GFR: 110.76 mL/min (ref >60.00)
Glucose, Bld: 105 mg/dL — ABNORMAL HIGH (ref 70–99)
Potassium: 3.9 mEq/L (ref 3.5–5.1)
Sodium: 139 mEq/L (ref 135–145)

## 2014-11-19 LAB — LIPID PANEL
Cholesterol: 133 mg/dL (ref 0–200)
HDL: 34.1 mg/dL — ABNORMAL LOW (ref >39.00)
LDL Cholesterol: 79 mg/dL (ref 0–99)
NonHDL: 98.9
Total CHOL/HDL Ratio: 4
Triglycerides: 100 mg/dL (ref 0.0–149.0)
VLDL: 20 mg/dL (ref 0.0–40.0)

## 2014-11-19 LAB — HEMOGLOBIN A1C: Hgb A1c MFr Bld: 7 % — ABNORMAL HIGH (ref 4.6–6.5)

## 2014-11-19 NOTE — Progress Notes (Signed)
Patient ID: Colton Bush, male   DOB: August 13, 1955, 59 y.o.   MRN: [id number]   Reason for Appointment : Followup for Type 2 Diabetes  History of Present Illness          Diagnosis: Type 2 diabetes mellitus, date of diagnosis:1999        Past history: He was probably started on metformin and diagnosis and is still taking this. At some point he was also given glipizide. Apparently his blood sugars have been getting her relatively higher over the years The last couple of years he has had difficulty with affording medication because of disability and not getting insurance He had been on Januvia for the previous 2-3 years with variable control. A1c has been over 7% since 2012 He had significant hyperglycemia occurring since at least 2014 because of difficulty getting his medications Because of a markedly increased A1c of 10.8 despite taking his glipizide and Janumet in 2/15 he was changed to a regimen of Glipizide ER 10 mg, metformin ER 2000 mg, Invokana 300 mg. Also had been given Trulicity initially but he stopped taking it on his own    Recent history:  On his last visit in 1/16 he had gained a significant amount of weight and his A1c was over 7%   Since his last visit his Invokana had been changed to [place] because of insurance preference. Although he has lost about 3 pounds since his last visit he has not been active exercising as discussed on each visit  Again he has not checked his POSTPRANDIAL readings and only checking in the morning despite reminders   He thinks he has been compliant with his 3 prescriptions for diabetes including 2 tablets of Janumet XR Usually has been fairly good with diet , only rarely going off diet with foods like pizza but avoiding sweets  Fasting blood sugars appear to be fairly good       Oral hypoglycemic drugs the patient is taking are: Glipizide ER, Jardiance,  Janumet XR    Side effects from medications have been: None   Glucose monitoring:  done  once a day        Glucometer: Freestyle   Blood Glucose readings: Range 95-140,   All readings in the mornings, average115 Hypoglycemia: none  reported    Glycemic control:  Lab Results  Component Value Date   HGBA1C 7.2* 08/06/2014   HGBA1C 6.9* 05/01/2014   HGBA1C 7.7* 02/10/2014   Lab Results  Component Value Date   MICROALBUR 8.5* 08/06/2014   LDLCALC 76 09/22/2014   CREATININE 0.80 09/22/2014    Self-care: The diet that the patient has been following is: tries to limit fats.  Breakfast is peanut butter and toast, lunch is a sandwich and dinner is usually baked chicken.     Meals: 3 meals per day. Dinner 6-7 pm         Exercise: none       Dietician visit: 4/15            Retinal exam: Most recent: 07/2013.    Weight history:  Wt Readings from Last 3 Encounters:  11/19/14 228 lb 6.4 oz (103.602 kg)  10/30/14 233 lb (105.688 kg)  10/08/14 233 lb 6.4 oz (105.87 kg)       Medication List       This list is accurate as of: 11/19/14  1:32 PM.  Always use your most recent med list.  amLODipine-benazepril 10-20 MG per capsule  Commonly known as:  LOTREL  TAKE 1 CAPSULE EVERY DAY     aspirin 81 MG tablet  Take 81 mg by mouth daily.     empagliflozin 25 MG Tabs tablet  Commonly known as:  JARDIANCE  Take 25 mg by mouth daily.     FISH OIL PO  Take 1 capsule by mouth daily.     freestyle lancets  Use as instructed to check blood sugar once a day dx code 250.00     FREESTYLE LITE test strip  Generic drug:  glucose blood  USE ONE STRIP TO CHECK BLOOD SUGAR ONCE DAILY     gabapentin 800 MG tablet  Commonly known as:  NEURONTIN  Take 300 mg by mouth 3 (three) times daily.     glipiZIDE 5 MG 24 hr tablet  Commonly known as:  GLUCOTROL XL  TAKE ONE TABLET BY MOUTH ONCE DAILY WITH BREAKFAST.     glucosamine-chondroitin 500-400 MG tablet  Take 1 tablet by mouth 2 (two) times daily.     JANUMET XR 50-1000 MG Tb24  Generic drug:   SitaGLIPtin-MetFORMIN HCl  TAKE 2 TABLETS BY MOUTH EVERY DAY     multivitamin tablet  Take 1 tablet by mouth daily. One a Day 50 +     omeprazole 20 MG capsule  Commonly known as:  PRILOSEC  TAKE ONE CAPSULE BY MOUTH ONCE DAILY     pravastatin 40 MG tablet  Commonly known as:  PRAVACHOL  TAKE 1 TABLET (40 MG TOTAL) BY MOUTH DAILY.     pravastatin 40 MG tablet  Commonly known as:  PRAVACHOL  TAKE 1 TABLET (40 MG TOTAL) BY MOUTH DAILY.        Allergies: No Known Allergies  Past Medical History  Diagnosis Date  . Hyperlipidemia     takes Zocor daily  . Hypertension     takes Amlodipine daily  . GERD (gastroesophageal reflux disease)     takes Omeprazole daily  . Depression     takes Cymbalta daily  . Bronchitis 1977  . Stiffness of hand joint     d/t cervical issues  . Neck pain     C4-7 stenosis and herniated disc  . Scoliosis     slight  . H/O hiatal hernia   . Diabetes mellitus type II     sees [name]   . Neuromuscular disorder     hiatal hernia  . Arthritis     left hand  . Spinal cord injury, C5-C7     c4-c7    Past Surgical History  Procedure Laterality Date  . Colonoscopy  12-14-03    per Dr. Olevia Perches, clear, repeat in 10 yrs   . Lymph nodes biopsy    . Melanoma rt calf  1999  . Egd with esophageal dilation  9-08    per Dr. Olevia Perches  . Tonsillectomy      as a child  . Anterior cervical decomp/discectomy fusion  08/18/2011    Procedure: ANTERIOR CERVICAL DECOMPRESSION/DISCECTOMY FUSION 3 LEVELS;  Surgeon: Winfield Cunas, MD;  Location: [place] [facility];  Service: Neurosurgery;  Laterality: N/A;  Anterior Cervical Four-Five/Five-Six/Six-Seven Decompression with Fusion, Plating, and Bonegraft  . Carpal tunnel release  2013    bilateral, per Dr. Christella Noa   . Ulnar tunnel release  2013    right arm, per Dr. Christella Noa   . Spine surgery    . Upper gastrointestinal endoscopy  Family History  Problem Relation Age of Onset  . Heart disease Father   .  Heart disease Brother 27  . Anesthesia problems Mother   . Heart disease Mother   . Dementia Mother   . Diabetes Sister   . Colon cancer Neg Hx   . Rectal cancer Neg Hx   . Stomach cancer Neg Hx   . Stroke Sister     Social History:  reports that he has never smoked. He has never used smokeless tobacco. He reports that he does not drink alcohol or use illicit drugs.    Review of Systems   Hypertension: He is compliant with his Lotrel. Blood pressure is fairly well controlled     Lipids:      He has been on pravastatin 40 mg   Lab Results  Component Value Date   CHOL 133 09/22/2014   HDL 37.60* 09/22/2014   LDLCALC 76 09/22/2014   LDLDIRECT 138.7 08/12/2013   TRIG 95.0 09/22/2014   CHOLHDL 4 09/22/2014                   No numbness or tingling in his feet   Physical Examination:  BP 150/84 mmHg  Pulse 96  Temp(Src) 97.9 F (36.6 C)  Resp 14  Ht 5' 8.5" (1.74 m)  Wt 228 lb 6.4 oz (103.602 kg)  BMI 34.22 kg/m2  Repeat blood pressure  132/78 No pedal edema  ASSESSMENT/PLAN:   Diabetes type 2, uncontrolled    The patient has fairly good blood sugars in the morning but need to check his A1c to assess his overall level of control since previously has been over 7% even with normal fasting readings   Discussed the need for checking readings after meals to help assess his overall control and the fact that A1c is a composite of all readings. He is not motivated to exercise and discussed importance of doing this  discussed ways he can exercise even indoors Consider switching to Trulicity if  U2V is not controlled  HYPERTENSION: Good control now with taking his Lotrel as before  Hyperlipidemia: He is on pravastatin and LDL has been at target lately   [place] 11/19/2014, 1:32 PM      No visits with results within 1 Week(s) from this visit. Latest known visit with results is:  Procedure visit on 10/30/2014  Component Date Value Ref Range Status  .  Glucose-Capillary 10/30/2014 105* 70 - 99 mg/dL Final  . Glucose-Capillary 10/30/2014 89  70 - 99 mg/dL Final

## 2014-11-19 NOTE — Patient Instructions (Addendum)
Walk daily  Please check blood sugars at least half the time about 2 hours after any meal and 3 times per week on waking up.   Please bring blood sugar monitor to each visit.  Recommended blood sugar levels about 2 hours after meal is 140-180 and on waking up 90-130  INDOOR EXERCISE IDEAS   Use the following examples for a creative indoor workout (perform each move for 2-3 minutes):   Warm up. Put on some music that makes you feel like moving, and dance around the living room.  Watch exercise shows on TV and move along with them. There are tons of free cable channels that have daily exercise shows on them for all levels - beginner through advanced.   You can easily find a number of exercise videos but use one that will suit your liking and exercise level; you can do these on your own schedule.  Walk up and down the steps.  Do dumbbell curls and presses (if you don't have weights, use full water bottles).  Do assisted squats, keeping your back on a fitness ball against the wall or using the back of the couch for support.  Shadow box: Lift and lower the left leg; jab with the right arm, then the left; then lift and lower the right leg.  Fence (you don't even need swords). Pretend you're holding a sword in each hand. Create an X pattern standing still, then moving forward and back.  Hop on your exercise bike or treadmill -- or, for something different, use a weighted hula hoop. If you don't have any of those, just go back to dancing.  Do abdominal crunches (hold a weighted ball for added resistance).  Cool down with [employer] "I Feel Good" -- or whatever tune makes you feel good

## 2014-11-27 ENCOUNTER — Other Ambulatory Visit: Payer: Self-pay | Admitting: Endocrinology

## 2014-12-13 ENCOUNTER — Other Ambulatory Visit: Payer: Self-pay | Admitting: Family Medicine

## 2014-12-20 ENCOUNTER — Other Ambulatory Visit: Payer: Self-pay | Admitting: Endocrinology

## 2015-01-13 ENCOUNTER — Other Ambulatory Visit: Payer: Self-pay | Admitting: Endocrinology

## 2015-01-14 ENCOUNTER — Other Ambulatory Visit: Payer: Self-pay | Admitting: Endocrinology

## 2015-01-25 ENCOUNTER — Other Ambulatory Visit: Payer: Self-pay

## 2015-02-04 ENCOUNTER — Other Ambulatory Visit: Payer: Self-pay | Admitting: Family Medicine

## 2015-02-11 ENCOUNTER — Other Ambulatory Visit (INDEPENDENT_AMBULATORY_CARE_PROVIDER_SITE_OTHER): Payer: 59

## 2015-02-11 ENCOUNTER — Ambulatory Visit: Payer: Self-pay | Admitting: Endocrinology

## 2015-02-11 DIAGNOSIS — E1165 Type 2 diabetes mellitus with hyperglycemia: Secondary | ICD-10-CM | POA: Diagnosis not present

## 2015-02-11 DIAGNOSIS — IMO0002 Reserved for concepts with insufficient information to code with codable children: Secondary | ICD-10-CM

## 2015-02-11 LAB — COMPREHENSIVE METABOLIC PANEL
ALT: 34 U/L (ref 0–53)
AST: 24 U/L (ref 0–37)
Albumin: 4.3 g/dL (ref 3.5–5.2)
Alkaline Phosphatase: 57 U/L (ref 39–117)
BUN: 16 mg/dL (ref 6–23)
CO2: 28 mEq/L (ref 19–32)
Calcium: 9.5 mg/dL (ref 8.4–10.5)
Chloride: 106 mEq/L (ref 96–112)
Creatinine, Ser: 0.74 mg/dL (ref 0.40–1.50)
GFR: 114.13 mL/min (ref >60.00)
Glucose, Bld: 114 mg/dL — ABNORMAL HIGH (ref 70–99)
Potassium: 3.9 mEq/L (ref 3.5–5.1)
Sodium: 140 mEq/L (ref 135–145)
Total Bilirubin: 0.5 mg/dL (ref 0.2–1.2)
Total Protein: 7.6 g/dL (ref 6.0–8.3)

## 2015-02-11 LAB — HEMOGLOBIN A1C: Hgb A1c MFr Bld: 7.1 % — ABNORMAL HIGH (ref 4.6–6.5)

## 2015-02-25 ENCOUNTER — Encounter: Payer: Self-pay | Admitting: Endocrinology

## 2015-02-25 ENCOUNTER — Ambulatory Visit (INDEPENDENT_AMBULATORY_CARE_PROVIDER_SITE_OTHER): Payer: 59 | Admitting: Endocrinology

## 2015-02-25 VITALS — BP 140/80 | HR 83 | Temp 98.2°F | Resp 16 | Ht 68.5 in | Wt 234.4 lb

## 2015-02-25 DIAGNOSIS — E1165 Type 2 diabetes mellitus with hyperglycemia: Secondary | ICD-10-CM | POA: Diagnosis not present

## 2015-02-25 DIAGNOSIS — IMO0002 Reserved for concepts with insufficient information to code with codable children: Secondary | ICD-10-CM

## 2015-02-25 MED ORDER — DULAGLUTIDE 0.75 MG/0.5ML ~~LOC~~ SOAJ: SUBCUTANEOUS | Status: DC

## 2015-02-25 MED ORDER — METFORMIN HCL ER 750 MG PO TB24
1500.0000 mg | ORAL_TABLET | Freq: Every day | ORAL | Status: DC
Start: 2015-02-25 — End: 2015-09-17

## 2015-02-25 MED ORDER — DULAGLUTIDE 1.5 MG/0.5ML ~~LOC~~ SOAJ
SUBCUTANEOUS | Status: DC
Start: 2015-02-25 — End: 2015-04-15

## 2015-02-25 NOTE — Patient Instructions (Addendum)
Check blood sugars on waking up .Marland Kitchen 2-3 .Marland Kitchen times a week Also check blood sugars about 2 hours after a meal and do this after different meals by rotation  Recommended blood sugar levels on waking up is 90-130 and about 2 hours after meal is 140-180 Please bring blood sugar monitor to each visit.  Start Trulicity weekly After 30 days go up to 1.5mg  weekly  When Janumet over start Metformin ER 750mg , 2 tabs daily  Walking program

## 2015-02-25 NOTE — Progress Notes (Signed)
Patient ID: Colton Bush, male   DOB: 08/07/1955, 59 y.o.   MRN: [id number]   Reason for Appointment : Followup for Type 2 Diabetes  History of Present Illness          Diagnosis: Type 2 diabetes mellitus, date of diagnosis:1999        Past history: He was probably started on metformin and diagnosis and is still taking this. At some point he was also given glipizide. Apparently his blood sugars have been getting her relatively higher over the years The last couple of years he has had difficulty with affording medication because of disability and not getting insurance He had been on Januvia for the previous 2-3 years with variable control. A1c has been over 7% since 2012 He had significant hyperglycemia occurring since at least 2014 because of difficulty getting his medications Because of a markedly increased A1c of 10.8 despite taking his glipizide and Janumet in 2/15 he was changed to a regimen of Glipizide ER 10 mg, metformin ER 2000 mg, Invokana 300 mg. Also had been given Trulicity initially but he stopped taking it on his own    Recent history:   Although his A1c is 7.1 and has been about the same this year he has gained about 6 pounds since his last visit He is unsure if he is taking Jardiance as directed  Current blood sugar patterns and problems identified:  Despite reminding him on each visit he is only checking his blood sugars in the mornings and none after meals  He has difficulty losing weight and does not like to exercise  He does not know why his weight has gone up, probably inconsistent diet.  Has not been eating out any more than usual  No hypoglycemic symptoms with taking glipizide ER 5 mg daily  Fasting blood sugars appear to be fairly good with a range of 98-142 and average 118 Oral hypoglycemic drugs the patient is taking are: Glipizide ER, Jardiance,  Janumet XR    Side effects from medications have been: None   Glucose monitoring:  done once a  day        Glucometer: Freestyle   Blood Glucose readings: As above Hypoglycemia: none  reported    Glycemic control:   Lab Results  Component Value Date   HGBA1C 7.1* 02/11/2015   HGBA1C 7.0* 11/19/2014   HGBA1C 7.2* 08/06/2014   Lab Results  Component Value Date   MICROALBUR 8.5* 08/06/2014   LDLCALC 79 11/19/2014   CREATININE 0.74 02/11/2015    Self-care: The diet that the patient has been following is: tries to limit fats.  Breakfast is peanut butter and toast, lunch is a sandwich and dinner is usually baked chicken.     Meals: 3 meals per day. Dinner 6-7 pm         Exercise: none       Dietician visit: 4/15            Retinal exam: Most recent: 07/2013.    Weight history:  Wt Readings from Last 3 Encounters:  02/25/15 234 lb 6.4 oz (106.323 kg)  11/19/14 228 lb 6.4 oz (103.602 kg)  10/30/14 233 lb (105.688 kg)       Medication List       This list is accurate as of: 02/25/15  8:52 PM.  Always use your most recent med list.               amLODipine-benazepril 10-20 MG per capsule  Commonly known as:  LOTREL  TAKE 1 CAPSULE EVERY DAY     aspirin 81 MG tablet  Take 81 mg by mouth daily.     Dulaglutide 0.75 MG/0.5ML Sopn  Commonly known as:  TRULICITY  Inject 1 pen weekly     FISH OIL PO  Take 1 capsule by mouth daily.     freestyle lancets  USE AS INSTRUCTED TO CHECK BLOOD SUGAR ONCE A DAY.     FREESTYLE LITE test strip  Generic drug:  glucose blood  USE ONE STRIP TO CHECK BLOOD SUGAR ONCE DAILY     glipiZIDE 5 MG 24 hr tablet  Commonly known as:  GLUCOTROL XL  TAKE ONE TABLET BY MOUTH ONCE DAILY WITH BREAKFAST.     glucosamine-chondroitin 500-400 MG tablet  Take 1 tablet by mouth 2 (two) times daily.     JANUMET XR 50-1000 MG Tb24  Generic drug:  SitaGLIPtin-MetFORMIN HCl  TAKE 2 TABLETS BY MOUTH EVERY DAY     JARDIANCE 25 MG Tabs tablet  Generic drug:  empagliflozin  TAKE 1 TABLET BY MOUTH EVERY DAY     multivitamin tablet  Take 1  tablet by mouth daily. One a Day 50 +     omeprazole 20 MG capsule  Commonly known as:  PRILOSEC  TAKE ONE CAPSULE BY MOUTH ONCE DAILY     pravastatin 40 MG tablet  Commonly known as:  PRAVACHOL  TAKE 1 TABLET (40 MG TOTAL) BY MOUTH DAILY.        Allergies: No Known Allergies  Past Medical History  Diagnosis Date  . Hyperlipidemia     takes Zocor daily  . Hypertension     takes Amlodipine daily  . GERD (gastroesophageal reflux disease)     takes Omeprazole daily  . Depression     takes Cymbalta daily  . Bronchitis 1977  . Stiffness of hand joint     d/t cervical issues  . Neck pain     C4-7 stenosis and herniated disc  . Scoliosis     slight  . H/O hiatal hernia   . Diabetes mellitus type II     sees [name]   . Neuromuscular disorder     hiatal hernia  . Arthritis     left hand  . Spinal cord injury, C5-C7     c4-c7    Past Surgical History  Procedure Laterality Date  . Colonoscopy  12-14-03    per Dr. Olevia Perches, clear, repeat in 10 yrs   . Lymph nodes biopsy    . Melanoma rt calf  1999  . Egd with esophageal dilation  9-08    per Dr. Olevia Perches  . Tonsillectomy      as a child  . Anterior cervical decomp/discectomy fusion  08/18/2011    Procedure: ANTERIOR CERVICAL DECOMPRESSION/DISCECTOMY FUSION 3 LEVELS;  Surgeon: Winfield Cunas, MD;  Location: [place] [facility];  Service: Neurosurgery;  Laterality: N/A;  Anterior Cervical Four-Five/Five-Six/Six-Seven Decompression with Fusion, Plating, and Bonegraft  . Carpal tunnel release  2013    bilateral, per Dr. Christella Noa   . Ulnar tunnel release  2013    right arm, per Dr. Christella Noa   . Spine surgery    . Upper gastrointestinal endoscopy      Family History  Problem Relation Age of Onset  . Heart disease Father   . Heart disease Brother 49  . Anesthesia problems Mother   . Heart disease Mother   . Dementia Mother   .  Diabetes Sister   . Colon cancer Neg Hx   . Rectal cancer Neg Hx   . Stomach cancer Neg Hx   .  Stroke Sister     Social History:  reports that he has never smoked. He has never used smokeless tobacco. He reports that he does not drink alcohol or use illicit drugs.    Review of Systems   Hypertension: He is compliant with his Lotrel. Blood pressure is fairly well controlled    Lipids:   He has been on pravastatin 40 mg, continues to have low HDL   Lab Results  Component Value Date   CHOL 133 11/19/2014   HDL 34.10* 11/19/2014   LDLCALC 79 11/19/2014   LDLDIRECT 138.7 08/12/2013   TRIG 100.0 11/19/2014   CHOLHDL 4 11/19/2014                   Physical Examination:  BP 140/80 mmHg  Pulse 83  Temp(Src) 98.2 F (36.8 C)  Resp 16  Ht 5' 8.5" (1.74 m)  Wt 234 lb 6.4 oz (106.323 kg)  BMI 35.12 kg/m2  SpO2 93%   No  edema  ASSESSMENT/PLAN:   Diabetes type 2, uncontrolled    The patient has fairly good blood sugars in the morning but his A1c is still over 7% He may have postprandial hyperglycemia but despite reminders he does not check his readings after meals He has gained weight now and not clear why He is also not motivated to exercise despite reminders  need to check his A1c to assess his overall level of control since previously has been over 7% even with normal fasting readings  Discussed that because of his BMI being 35 and for better long-term control he should do better with using a GLP-1 drug instead of Januvia Showed him how to use the Trulicity pen injector and he will start with 0.75 mg weekly Discussed benefits and possible side effects of nausea After one month he will switch to 1.5 mg weekly  He will switch to metformin ER when finished with his Janumet XR Again reminded him to check more readings after meals  Patient Instructions  Check blood sugars on waking up .Marland Kitchen 2-3 .Marland Kitchen times a week Also check blood sugars about 2 hours after a meal and do this after different meals by rotation  Recommended blood sugar levels on waking up is 90-130 and  about 2 hours after meal is 140-180 Please bring blood sugar monitor to each visit.  Start Trulicity weekly After 30 days go up to 1.5mg  weekly  When Janumet over start Metformin ER 750mg , 2 tabs daily  Walking program      [facility] 02/25/2015, 8:52 PM      No visits with results within 1 Week(s) from this visit. Latest known visit with results is:  Appointment on 02/11/2015  Component Date Value Ref Range Status  . Hgb A1c MFr Bld 02/11/2015 7.1* 4.6 - 6.5 % Final   Glycemic Control Guidelines for People with Diabetes:Non Diabetic:  <6%Goal of Therapy: <7%Additional Action Suggested:  >8%   . Sodium 02/11/2015 140  135 - 145 mEq/L Final  . Potassium 02/11/2015 3.9  3.5 - 5.1 mEq/L Final  . Chloride 02/11/2015 106  96 - 112 mEq/L Final  . CO2 02/11/2015 28  19 - 32 mEq/L Final  . Glucose, Bld 02/11/2015 114* 70 - 99 mg/dL Final  . BUN 02/11/2015 16  6 - 23 mg/dL Final  . Creatinine, Ser 02/11/2015  0.74  0.40 - 1.50 mg/dL Final  . Total Bilirubin 02/11/2015 0.5  0.2 - 1.2 mg/dL Final  . Alkaline Phosphatase 02/11/2015 57  39 - 117 U/L Final  . AST 02/11/2015 24  0 - 37 U/L Final  . ALT 02/11/2015 34  0 - 53 U/L Final  . Total Protein 02/11/2015 7.6  6.0 - 8.3 g/dL Final  . Albumin 02/11/2015 4.3  3.5 - 5.2 g/dL Final  . Calcium 02/11/2015 9.5  8.4 - 10.5 mg/dL Final  . GFR 02/11/2015 114.13  >60.00 mL/min Final

## 2015-03-11 ENCOUNTER — Other Ambulatory Visit: Payer: Self-pay | Admitting: Family Medicine

## 2015-03-23 ENCOUNTER — Other Ambulatory Visit: Payer: Self-pay | Admitting: Endocrinology

## 2015-03-24 ENCOUNTER — Other Ambulatory Visit: Payer: Self-pay | Admitting: Endocrinology

## 2015-04-01 ENCOUNTER — Other Ambulatory Visit: Payer: Self-pay | Admitting: Endocrinology

## 2015-04-08 ENCOUNTER — Other Ambulatory Visit: Payer: Self-pay | Admitting: Endocrinology

## 2015-04-13 ENCOUNTER — Other Ambulatory Visit: Payer: Self-pay | Admitting: Endocrinology

## 2015-04-15 ENCOUNTER — Encounter: Payer: Self-pay | Admitting: Endocrinology

## 2015-04-15 ENCOUNTER — Ambulatory Visit (INDEPENDENT_AMBULATORY_CARE_PROVIDER_SITE_OTHER): Payer: 59 | Admitting: Endocrinology

## 2015-04-15 VITALS — BP 118/62 | HR 85 | Temp 98.8°F | Resp 16 | Ht 68.5 in | Wt 232.0 lb

## 2015-04-15 DIAGNOSIS — IMO0002 Reserved for concepts with insufficient information to code with codable children: Secondary | ICD-10-CM

## 2015-04-15 DIAGNOSIS — E1165 Type 2 diabetes mellitus with hyperglycemia: Secondary | ICD-10-CM | POA: Diagnosis not present

## 2015-04-15 MED ORDER — GLIPIZIDE ER 2.5 MG PO TB24: 2.5000 mg | ORAL_TABLET | Freq: Every day | ORAL | Status: DC

## 2015-04-15 NOTE — Patient Instructions (Signed)
Start to walk daily  Check blood sugars on waking up ..3  .. times a week Also check blood sugars about 2 hours after a meal and do this after different meals by rotation  Recommended blood sugar levels on waking up is 90-130 and about 2 hours after meal is 140-180 Please bring blood sugar monitor to each visit.

## 2015-04-15 NOTE — Progress Notes (Signed)
Patient ID: Colton Bush, male   DOB: 1955-11-22, 59 y.o.   MRN: [id number]   Reason for Appointment : Followup for Type 2 Diabetes  History of Present Illness          Diagnosis: Type 2 diabetes mellitus, date of diagnosis:1999        Past history: He was probably started on metformin and diagnosis and is still taking this. At some point he was also given glipizide. Apparently his blood sugars have been getting her relatively higher over the years The last couple of years he has had difficulty with affording medication because of disability and not getting insurance He had been on Januvia for the previous 2-3 years with variable control. A1c has been over 7% since 2012 He had significant hyperglycemia occurring since at least 2014 because of difficulty getting his medications Because of a markedly increased A1c of 10.8 despite taking his glipizide and Janumet in 2/15 he was changed to a regimen of Glipizide ER 10 mg, metformin ER 2000 mg, Invokana 300 mg. Also had been given Trulicity initially but he stopped taking it on his own    Recent history:   Last  A1c is 7.1 and has been about the same this year  Because of his tendency to weight gain and multiple benefit he was changed from [place] to Trulicity in 3/66 He says that he does have better satiety with this and is able to control portions and snacks better Also has lost some weight Has had no recent labs done to assess his overall control He still has some Janumet and has not switched to metformin yet  Current blood sugar patterns and problems identified:  Despite reminding him on each visit he is still checking his blood sugars in the mornings and none after meals  He still does not do any formal exercise despite reminders  No hypoglycemic symptoms with taking glipizide ER 5 mg daily  Fasting blood sugars appear to be fairly good with a range of 98-142 and average 118 Oral hypoglycemic drugs the patient is  taking are: Glipizide ER, Jardiance,  Janumet XR    Side effects from medications have been: None   Glucose monitoring:  done once a day        Glucometer: Freestyle   Blood Glucose readings: range 71-116 with average 100, doing only fasting readings  Hypoglycemia: none    Glycemic control:   Lab Results  Component Value Date   HGBA1C 7.1* 02/11/2015   HGBA1C 7.0* 11/19/2014   HGBA1C 7.2* 08/06/2014   Lab Results  Component Value Date   MICROALBUR 8.5* 08/06/2014   LDLCALC 79 11/19/2014   CREATININE 0.74 02/11/2015    Self-care: The diet that the patient has been following is: tries to limit fats.  Breakfast is peanut butter and toast, lunch is a sandwich and dinner is usually baked chicken.     Meals: 3 meals per day. Dinner 6-7 pm         Exercise: none       Dietician visit: 4/15            Retinal exam: Most recent: 07/2013.    Weight history:  Wt Readings from Last 3 Encounters:  04/15/15 232 lb (105.235 kg)  02/25/15 234 lb 6.4 oz (106.323 kg)  11/19/14 228 lb 6.4 oz (103.602 kg)       Medication List       This list is accurate as of: 04/15/15 11:59 PM.  Always use your most recent med list.               amLODipine-benazepril 10-20 MG per capsule  Commonly known as:  LOTREL  TAKE 1 CAPSULE EVERY DAY     aspirin 81 MG tablet  Take 81 mg by mouth daily.     FISH OIL PO  Take 1 capsule by mouth daily.     freestyle lancets  USE AS INSTRUCTED TO CHECK BLOOD SUGAR ONCE A DAY.     FREESTYLE LITE test strip  Generic drug:  glucose blood  USE ONE STRIP ONCE DAILY TO CHECK BLOOD SUGAR     glipiZIDE 2.5 MG 24 hr tablet  Commonly known as:  GLUCOTROL XL  Take 1 tablet (2.5 mg total) by mouth daily with breakfast.     glucosamine-chondroitin 500-400 MG tablet  Take 1 tablet by mouth 2 (two) times daily.     JANUMET XR 50-1000 MG Tb24  Generic drug:  SitaGLIPtin-MetFORMIN HCl  Take 2 tablets by mouth daily.     JARDIANCE 25 MG Tabs tablet    Generic drug:  empagliflozin  TAKE 1 TABLET BY MOUTH EVERY DAY     metFORMIN 750 MG 24 hr tablet  Commonly known as:  GLUCOPHAGE-XR  Take 2 tablets (1,500 mg total) by mouth daily with breakfast.     multivitamin tablet  Take 1 tablet by mouth daily. One a Day 50 +     omeprazole 20 MG capsule  Commonly known as:  PRILOSEC  TAKE ONE CAPSULE BY MOUTH ONCE DAILY     pravastatin 40 MG tablet  Commonly known as:  PRAVACHOL  TAKE 1 TABLET (40 MG TOTAL) BY MOUTH DAILY.     TRULICITY 5.18 AC/1.6SA Sopn  Generic drug:  Dulaglutide  INJECT 1 PEN WEEKLY        Allergies: No Known Allergies  Past Medical History  Diagnosis Date  . Hyperlipidemia     takes Zocor daily  . Hypertension     takes Amlodipine daily  . GERD (gastroesophageal reflux disease)     takes Omeprazole daily  . Depression     takes Cymbalta daily  . Bronchitis 1977  . Stiffness of hand joint     d/t cervical issues  . Neck pain     C4-7 stenosis and herniated disc  . Scoliosis     slight  . H/O hiatal hernia   . Diabetes mellitus type II     sees [name]   . Neuromuscular disorder     hiatal hernia  . Arthritis     left hand  . Spinal cord injury, C5-C7     c4-c7    Past Surgical History  Procedure Laterality Date  . Colonoscopy  12-14-03    per Dr. Olevia Perches, clear, repeat in 10 yrs   . Lymph nodes biopsy    . Melanoma rt calf  1999  . Egd with esophageal dilation  9-08    per Dr. Olevia Perches  . Tonsillectomy      as a child  . Anterior cervical decomp/discectomy fusion  08/18/2011    Procedure: ANTERIOR CERVICAL DECOMPRESSION/DISCECTOMY FUSION 3 LEVELS;  Surgeon: Winfield Cunas, MD;  Location: [facility];  Service: Neurosurgery;  Laterality: N/A;  Anterior Cervical Four-Five/Five-Six/Six-Seven Decompression with Fusion, Plating, and Bonegraft  . Carpal tunnel release  2013    bilateral, per Dr. Christella Noa   . Ulnar tunnel release  2013    right arm, per Dr.  Cabbell   . Spine surgery    .  Upper gastrointestinal endoscopy      Family History  Problem Relation Age of Onset  . Heart disease Father   . Heart disease Brother 6  . Anesthesia problems Mother   . Heart disease Mother   . Dementia Mother   . Diabetes Sister   . Colon cancer Neg Hx   . Rectal cancer Neg Hx   . Stomach cancer Neg Hx   . Stroke Sister     Social History:  reports that he has never smoked. He has never used smokeless tobacco. He reports that he does not drink alcohol or use illicit drugs.    Review of Systems   Hypertension: He is controlled with Lotrel.   Lipids:   He has been on pravastatin 40 mg, continues to have low HDL   Lab Results  Component Value Date   CHOL 133 11/19/2014   HDL 34.10* 11/19/2014   LDLCALC 79 11/19/2014   LDLDIRECT 138.7 08/12/2013   TRIG 100.0 11/19/2014   CHOLHDL 4 11/19/2014                   Physical Examination:  BP 118/62 mmHg  Pulse 85  Temp(Src) 98.8 F (37.1 C)  Resp 16  Ht 5' 8.5" (1.74 m)  Wt 232 lb (105.235 kg)  BMI 34.76 kg/m2  SpO2 4%    ASSESSMENT/PLAN:   Diabetes type 2, uncontrolled    The patient has significantly improved blood sugars with switching from Januvia to Trulicity Also his compliance with diet is improved and he is starting to lose weight Since he is getting results with only 0.75 mg he will continue this dose Reminded him to check readings after meals instead of just in the morning Also needs to start regular exercise  He will switch to metformin ER when finished with his Janumet XR   Patient Instructions  Start to walk daily  Check blood sugars on waking up ..3  .. times a week Also check blood sugars about 2 hours after a meal and do this after different meals by rotation  Recommended blood sugar levels on waking up is 90-130 and about 2 hours after meal is 140-180 Please bring blood sugar monitor to each visit.       Colton Bush 04/16/2015, 1:44 PM      No visits with results within 1  Week(s) from this visit. Latest known visit with results is:  Appointment on 02/11/2015  Component Date Value Ref Range Status  . Hgb A1c MFr Bld 02/11/2015 7.1* 4.6 - 6.5 % Final   Glycemic Control Guidelines for People with Diabetes:Non Diabetic:  <6%Goal of Therapy: <7%Additional Action Suggested:  >8%   . Sodium 02/11/2015 140  135 - 145 mEq/L Final  . Potassium 02/11/2015 3.9  3.5 - 5.1 mEq/L Final  . Chloride 02/11/2015 106  96 - 112 mEq/L Final  . CO2 02/11/2015 28  19 - 32 mEq/L Final  . Glucose, Bld 02/11/2015 114* 70 - 99 mg/dL Final  . BUN 02/11/2015 16  6 - 23 mg/dL Final  . Creatinine, Ser 02/11/2015 0.74  0.40 - 1.50 mg/dL Final  . Total Bilirubin 02/11/2015 0.5  0.2 - 1.2 mg/dL Final  . Alkaline Phosphatase 02/11/2015 57  39 - 117 U/L Final  . AST 02/11/2015 24  0 - 37 U/L Final  . ALT 02/11/2015 34  0 - 53 U/L Final  . Total Protein 02/11/2015 7.6  6.0 - 8.3 g/dL Final  . Albumin 02/11/2015 4.3  3.5 - 5.2 g/dL Final  . Calcium 02/11/2015 9.5  8.4 - 10.5 mg/dL Final  . GFR 02/11/2015 114.13  >60.00 mL/min Final

## 2015-04-22 ENCOUNTER — Ambulatory Visit: Payer: Self-pay | Admitting: Endocrinology

## 2015-07-08 ENCOUNTER — Other Ambulatory Visit (INDEPENDENT_AMBULATORY_CARE_PROVIDER_SITE_OTHER): Payer: 59

## 2015-07-08 DIAGNOSIS — IMO0002 Reserved for concepts with insufficient information to code with codable children: Secondary | ICD-10-CM

## 2015-07-08 DIAGNOSIS — E1165 Type 2 diabetes mellitus with hyperglycemia: Secondary | ICD-10-CM | POA: Diagnosis not present

## 2015-07-08 LAB — COMPREHENSIVE METABOLIC PANEL
ALT: 25 U/L (ref 0–53)
AST: 16 U/L (ref 0–37)
Albumin: 4.3 g/dL (ref 3.5–5.2)
Alkaline Phosphatase: 62 U/L (ref 39–117)
BUN: 14 mg/dL (ref 6–23)
CO2: 28 mEq/L (ref 19–32)
Calcium: 9.5 mg/dL (ref 8.4–10.5)
Chloride: 106 mEq/L (ref 96–112)
Creatinine, Ser: 0.7 mg/dL (ref 0.40–1.50)
GFR: 121.53 mL/min (ref >60.00)
Glucose, Bld: 109 mg/dL — ABNORMAL HIGH (ref 70–99)
Potassium: 3.9 mEq/L (ref 3.5–5.1)
Sodium: 142 mEq/L (ref 135–145)
Total Bilirubin: 0.5 mg/dL (ref 0.2–1.2)
Total Protein: 7.4 g/dL (ref 6.0–8.3)

## 2015-07-08 LAB — HEMOGLOBIN A1C: Hgb A1c MFr Bld: 6.9 % — ABNORMAL HIGH (ref 4.6–6.5)

## 2015-07-15 ENCOUNTER — Ambulatory Visit (INDEPENDENT_AMBULATORY_CARE_PROVIDER_SITE_OTHER): Payer: 59 | Admitting: Endocrinology

## 2015-07-15 ENCOUNTER — Encounter: Payer: Self-pay | Admitting: Endocrinology

## 2015-07-15 VITALS — BP 132/68 | HR 87 | Temp 98.5°F | Resp 14 | Ht 68.25 in | Wt 227.6 lb

## 2015-07-15 DIAGNOSIS — E119 Type 2 diabetes mellitus without complications: Secondary | ICD-10-CM

## 2015-07-15 DIAGNOSIS — I10 Essential (primary) hypertension: Secondary | ICD-10-CM | POA: Diagnosis not present

## 2015-07-15 NOTE — Progress Notes (Signed)
Patient ID: Colton Bush, male   DOB: 01-Dec-1955, 59 y.o.   MRN: [id number]   Reason for Appointment : Followup for Type 2 Diabetes  History of Present Illness          Diagnosis: Type 2 diabetes mellitus, date of diagnosis:1999        Past history: He was probably started on metformin and diagnosis and is still taking this. At some point he was also given glipizide. Apparently his blood sugars have been getting her relatively higher over the years The last couple of years he has had difficulty with affording medication because of disability and not getting insurance He had been on Januvia for the previous 2-3 years with variable control. A1c has been over 7% since 2012 He had significant hyperglycemia occurring since at least 2014 because of difficulty getting his medications Because of a markedly increased A1c of 10.8 despite taking his glipizide and Janumet in 2/15 he was changed to a regimen of Glipizide ER 10 mg, metformin ER 2000 mg, Invokana 300 mg. Also had been given Trulicity initially but he stopped taking it on his own    Recent history:   He is on multiple drugs including Trulicity with fairly good control this year and A1c about 7% consistently Because of his tendency to weight gain and multiple benefits he was changed from [place] to Trulicity in [id number] With this he has now lost significant amount of weight and is able to avoid large meal or snacks s Also was changed from Janumet to metformin ER although he is unclear whether he is taking this or not  Current blood sugar patterns and problems identified:  Again he is checking his blood sugar only in the morning and No postprandial readings as directed on each visit  Fasting readings may be slightly higher overall compared to the last visit  No hypoglycemia with low dose glipizide ER 2.5 mg   He still does not do any walking for exercise despite reminders  Oral hypoglycemic drugs the patient is taking are:  Glipizide ER, Jardiance 25   Side effects from medications have been: None   Glucose monitoring:  done once a day        Glucometer: Freestyle   Blood Glucose readings: range 96-141 with mean on 119, all fasting readings  Hypoglycemia: none    Glycemic control:   Lab Results  Component Value Date   HGBA1C 6.9* 07/08/2015   HGBA1C 7.1* 02/11/2015   HGBA1C 7.0* 11/19/2014   Lab Results  Component Value Date   MICROALBUR 8.5* 08/06/2014   LDLCALC 79 11/19/2014   CREATININE 0.70 07/08/2015    Self-care: The diet that the patient has been following is: tries to limit fats.  Breakfast is peanut butter and toast, lunch is a sandwich and dinner is usually baked chicken.     Meals: 3 meals per day. Dinner 6-7 pm          Exercise: minimal        Dietician visit: 4/15            Retinal exam: Most recent: 07/2013.    Weight history:  Wt Readings from Last 3 Encounters:  07/15/15 227 lb 9.6 oz (103.239 kg)  04/15/15 232 lb (105.235 kg)  02/25/15 234 lb 6.4 oz (106.323 kg)       Medication List       This list is accurate as of: 07/15/15 12:51 PM.  Always use your most recent med  list.               amLODipine-benazepril 10-20 MG capsule  Commonly known as:  LOTREL  TAKE 1 CAPSULE EVERY DAY     aspirin 81 MG tablet  Take 81 mg by mouth daily.     FISH OIL PO  Take 1 capsule by mouth daily.     freestyle lancets  USE AS INSTRUCTED TO CHECK BLOOD SUGAR ONCE A DAY.     FREESTYLE LITE test strip  Generic drug:  glucose blood  USE ONE STRIP ONCE DAILY TO CHECK BLOOD SUGAR     glipiZIDE 2.5 MG 24 hr tablet  Commonly known as:  GLUCOTROL XL  Take 1 tablet (2.5 mg total) by mouth daily with breakfast.     glucosamine-chondroitin 500-400 MG tablet  Take 1 tablet by mouth 2 (two) times daily.     JARDIANCE 25 MG Tabs tablet  Generic drug:  empagliflozin  TAKE 1 TABLET BY MOUTH EVERY DAY     metFORMIN 750 MG 24 hr tablet  Commonly known as:  GLUCOPHAGE-XR    Take 2 tablets (1,500 mg total) by mouth daily with breakfast.     multivitamin tablet  Take 1 tablet by mouth daily. One a Day 50 +     omeprazole 20 MG capsule  Commonly known as:  PRILOSEC  TAKE ONE CAPSULE BY MOUTH ONCE DAILY     pravastatin 40 MG tablet  Commonly known as:  PRAVACHOL  TAKE 1 TABLET (40 MG TOTAL) BY MOUTH DAILY.     TRULICITY [id number] Sopn  Generic drug:  Dulaglutide  INJECT 1 PEN WEEKLY        Allergies: No Known Allergies  Past Medical History  Diagnosis Date  . Hyperlipidemia     takes Zocor daily  . Hypertension     takes Amlodipine daily  . GERD (gastroesophageal reflux disease)     takes Omeprazole daily  . Depression     takes Cymbalta daily  . Bronchitis 1977  . Stiffness of hand joint     d/t cervical issues  . Neck pain     C4-7 stenosis and herniated disc  . Scoliosis     slight  . H/O hiatal hernia   . Diabetes mellitus type II     sees [name]   . Neuromuscular disorder ([place])     hiatal hernia  . Arthritis     left hand  . Spinal cord injury, C5-C7 ([place])     c4-c7    Past Surgical History  Procedure Laterality Date  . Colonoscopy  12-14-03    per Dr. Olevia Perches, clear, repeat in 10 yrs   . Lymph nodes biopsy    . Melanoma rt calf  1999  . Egd with esophageal dilation  9-08    per Dr. Olevia Perches  . Tonsillectomy      as a child  . Anterior cervical decomp/discectomy fusion  08/18/2011    Procedure: ANTERIOR CERVICAL DECOMPRESSION/DISCECTOMY FUSION 3 LEVELS;  Surgeon: Winfield Cunas, MD;  Location: [place];  Service: Neurosurgery;  Laterality: N/A;  Anterior Cervical Four-Five/Five-Six/Six-Seven Decompression with Fusion, Plating, and Bonegraft  . Carpal tunnel release  2013    bilateral, per Dr. Christella Noa   . Ulnar tunnel release  2013    right arm, per Dr. Christella Noa   . Spine surgery    . Upper gastrointestinal endoscopy      Family History  Problem Relation Age of Onset  .  Heart disease Father   . Heart  disease Brother 61  . Anesthesia problems Mother   . Heart disease Mother   . Dementia Mother   . Diabetes Sister   . Colon cancer Neg Hx   . Rectal cancer Neg Hx   . Stomach cancer Neg Hx   . Stroke Sister     Social History:  reports that he has never smoked. He has never used smokeless tobacco. He reports that he does not drink alcohol or use illicit drugs.    Review of Systems   Hypertension: This is controlled with Lotrel.   Lipids:   He has been on pravastatin 40 mg, he tends to have low HDL, needs follow-up   Lab Results  Component Value Date   CHOL 133 11/19/2014   HDL 34.10* 11/19/2014   LDLCALC 79 11/19/2014   LDLDIRECT 138.7 08/12/2013   TRIG 100.0 11/19/2014   CHOLHDL 4 11/19/2014                 Asking about large toenails that need to be trimmed No numbness in his toes  Physical Examination:  BP 132/68 mmHg  Pulse 87  Temp(Src) 98.5 F (36.9 C)  Resp 14  Ht 5' 8.25" (1.734 m)  Wt 227 lb 9.6 oz (103.239 kg)  BMI 34.34 kg/m2  SpO2 97%   Diabetic foot exam shows normal monofilament sensation in the toes and plantar surfaces, no skin lesions or ulcers on the feet and normal pedal pulses   ASSESSMENT/PLAN:   Diabetes type 2, uncontrolled   See history of present illness for detailed discussion of his current management, blood sugar patterns and problems identified He is doing fairly well with using Trulicity [id number] mg weekly and has lost weight with this Even without any exercise he is doing fairly well with A1c 6.9 now  Again discussed need to check readings after meals at home to help him with identifying foods that are causing higher sugars Also recommended trying to walk regularly  HYPERTENSION: Well controlled  He refuses influenza vaccine  Patient Instructions  Check blood sugars on waking up 2-3  times a week Also check blood sugars about 2 hours after a meal and do this after different meals by rotation  Recommended blood sugar levels  on waking up is 90-130 and about 2 hours after meal is 130-160  Please bring your blood sugar monitor to each visit, thank you  WALK 10-15 min daily at lunch or weekends       [facility] 07/15/2015, 12:51 PM      No visits with results within 1 Week(s) from this visit. Latest known visit with results is:  Lab on 07/08/2015  Component Date Value Ref Range Status  . Hgb A1c MFr Bld 07/08/2015 6.9* 4.6 - 6.5 % Final   Glycemic Control Guidelines for People with Diabetes:Non Diabetic:  <6%Goal of Therapy: <7%Additional Action Suggested:  >8%   . Sodium 07/08/2015 142  135 - 145 mEq/L Final  . Potassium 07/08/2015 3.9  3.5 - 5.1 mEq/L Final  . Chloride 07/08/2015 106  96 - 112 mEq/L Final  . CO2 07/08/2015 28  19 - 32 mEq/L Final  . Glucose, Bld 07/08/2015 109* 70 - 99 mg/dL Final  . BUN 07/08/2015 14  6 - 23 mg/dL Final  . Creatinine, Ser 07/08/2015 0.70  0.40 - 1.50 mg/dL Final  . Total Bilirubin 07/08/2015 0.5  0.2 - 1.2 mg/dL Final  . Alkaline Phosphatase 07/08/2015 62  39 -  117 U/L Final  . AST 07/08/2015 16  0 - 37 U/L Final  . ALT 07/08/2015 25  0 - 53 U/L Final  . Total Protein 07/08/2015 7.4  6.0 - 8.3 g/dL Final  . Albumin 07/08/2015 4.3  3.5 - 5.2 g/dL Final  . Calcium 07/08/2015 9.5  8.4 - 10.5 mg/dL Final  . GFR 07/08/2015 121.53  >60.00 mL/min Final

## 2015-07-15 NOTE — Patient Instructions (Signed)
Check blood sugars on waking up 2-3  times a week Also check blood sugars about 2 hours after a meal and do this after different meals by rotation  Recommended blood sugar levels on waking up is 90-130 and about 2 hours after meal is 130-160  Please bring your blood sugar monitor to each visit, thank you  WALK 10-15 min daily at lunch or weekends

## 2015-07-21 ENCOUNTER — Other Ambulatory Visit: Payer: Self-pay | Admitting: Endocrinology

## 2015-09-06 ENCOUNTER — Other Ambulatory Visit: Payer: Self-pay | Admitting: Family Medicine

## 2015-09-17 ENCOUNTER — Other Ambulatory Visit: Payer: Self-pay | Admitting: Endocrinology

## 2015-10-06 ENCOUNTER — Other Ambulatory Visit: Payer: Self-pay | Admitting: Endocrinology

## 2015-10-07 ENCOUNTER — Ambulatory Visit (INDEPENDENT_AMBULATORY_CARE_PROVIDER_SITE_OTHER): Payer: Self-pay | Admitting: Family Medicine

## 2015-10-07 ENCOUNTER — Ambulatory Visit (INDEPENDENT_AMBULATORY_CARE_PROVIDER_SITE_OTHER): Payer: 59 | Admitting: Family Medicine

## 2015-10-07 ENCOUNTER — Encounter: Payer: Self-pay | Admitting: Family Medicine

## 2015-10-07 VITALS — BP 138/85 | HR 76 | Temp 98.1°F | Ht 68.25 in | Wt 226.0 lb

## 2015-10-07 VITALS — BP 118/72 | HR 85 | Temp 98.3°F | Resp 18 | Ht 70.75 in | Wt 226.6 lb

## 2015-10-07 DIAGNOSIS — B351 Tinea unguium: Secondary | ICD-10-CM | POA: Diagnosis not present

## 2015-10-07 DIAGNOSIS — Z0000 Encounter for general adult medical examination without abnormal findings: Secondary | ICD-10-CM

## 2015-10-07 DIAGNOSIS — I10 Essential (primary) hypertension: Secondary | ICD-10-CM | POA: Diagnosis not present

## 2015-10-07 DIAGNOSIS — Z021 Encounter for pre-employment examination: Secondary | ICD-10-CM

## 2015-10-07 LAB — CBC WITH DIFFERENTIAL/PLATELET
Basophils Absolute: 0.1 10*3/uL (ref 0.0–0.1)
Basophils Relative: 0.9 % (ref 0.0–3.0)
Eosinophils Absolute: 0.2 10*3/uL (ref 0.0–0.7)
Eosinophils Relative: 1.9 % (ref 0.0–5.0)
HCT: 42.9 % (ref 39.0–52.0)
Hemoglobin: 14.1 g/dL (ref 13.0–17.0)
Lymphocytes Relative: 18.9 % (ref 12.0–46.0)
Lymphs Abs: 2.1 10*3/uL (ref 0.7–4.0)
MCHC: 32.8 g/dL (ref 30.0–36.0)
MCV: 83.2 fl (ref 78.0–100.0)
Monocytes Absolute: 0.7 10*3/uL (ref 0.1–1.0)
Monocytes Relative: 6.7 % (ref 3.0–12.0)
Neutro Abs: 7.9 10*3/uL — ABNORMAL HIGH (ref 1.4–7.7)
Neutrophils Relative %: 71.6 % (ref 43.0–77.0)
Platelets: 315 10*3/uL (ref 150.0–400.0)
RBC: 5.15 Mil/uL (ref 4.22–5.81)
RDW: 15.2 % (ref 11.5–15.5)
WBC: 11 10*3/uL — ABNORMAL HIGH (ref 4.0–10.5)

## 2015-10-07 LAB — POC URINALSYSI DIPSTICK (AUTOMATED)
Bilirubin, UA: NEGATIVE
Blood, UA: NEGATIVE
Leukocytes, UA: NEGATIVE
Nitrite, UA: NEGATIVE
Spec Grav, UA: 1.015
Urobilinogen, UA: 0.2
pH, UA: 5.5

## 2015-10-07 LAB — HEPATIC FUNCTION PANEL
ALT: 34 U/L (ref 0–53)
AST: 19 U/L (ref 0–37)
Albumin: 4.7 g/dL (ref 3.5–5.2)
Alkaline Phosphatase: 62 U/L (ref 39–117)
Bilirubin, Direct: 0.1 mg/dL (ref 0.0–0.3)
Total Bilirubin: 0.6 mg/dL (ref 0.2–1.2)
Total Protein: 7.7 g/dL (ref 6.0–8.3)

## 2015-10-07 LAB — BASIC METABOLIC PANEL
BUN: 17 mg/dL (ref 6–23)
CO2: 26 mEq/L (ref 19–32)
Calcium: 9.9 mg/dL (ref 8.4–10.5)
Chloride: 103 mEq/L (ref 96–112)
Creatinine, Ser: 0.76 mg/dL (ref 0.40–1.50)
GFR: 110.44 mL/min (ref >60.00)
Glucose, Bld: 143 mg/dL — ABNORMAL HIGH (ref 70–99)
Potassium: 4.1 mEq/L (ref 3.5–5.1)
Sodium: 141 mEq/L (ref 135–145)

## 2015-10-07 LAB — LIPID PANEL
Cholesterol: 141 mg/dL (ref 0–200)
HDL: 34.8 mg/dL — ABNORMAL LOW (ref >39.00)
LDL Cholesterol: 79 mg/dL (ref 0–99)
NonHDL: 105.81
Total CHOL/HDL Ratio: 4
Triglycerides: 135 mg/dL (ref 0.0–149.0)
VLDL: 27 mg/dL (ref 0.0–40.0)

## 2015-10-07 LAB — PSA: PSA: 1.25 ng/mL (ref 0.10–4.00)

## 2015-10-07 LAB — TSH: TSH: 1.65 u[IU]/mL (ref 0.35–4.50)

## 2015-10-07 NOTE — Progress Notes (Signed)
Subjective:  This chart was scribed for Colton Bush, by [name], at [facility].  This patient was seen in room 10 and the patient's care was started at 12:14 PM.   Chief Complaint  Patient presents with  . Employment Physical    DOT PE     Patient ID: Colton Bush, male    DOB: 1955/12/31, 60 y.o.   MRN: [id number]  HPI  HPI Comments: Colton Bush is a 60 y.o. 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 Patient is up to date with his EKG and states that there were no concerns.  He states that he had a spinal injury a long time ago which caused him to have arthritis. He states his joints ache occasionally.   Patients last A1C was checked in 2016 December - 6.9. He has been on diabetes medication for the last 15 years.   Patient works for a [employer].  Patient Active Problem List   Diagnosis Date Noted  . Diabetes mellitus type 2, controlled ([place]) 05/05/2014  . Cervical spondylosis with myelopathy 08/18/2011  . CERUMEN IMPACTION 12/14/2008  . Diabetes ([place]) 09/16/2007  . Hyperlipidemia, mixed 09/16/2007  . HYPERTENSION 09/16/2007  . GERD 09/16/2007  . ESOPHAGEAL STRICTURE 04/04/2007  . HIATAL HERNIA 04/04/2007   Past Medical History  Diagnosis Date  . Hyperlipidemia     takes Zocor daily  . Hypertension     takes Amlodipine daily  . GERD (gastroesophageal reflux disease)     takes Omeprazole daily  . Depression     takes Cymbalta daily  . Bronchitis 1977  . Stiffness of hand joint     d/t cervical issues  . Neck pain     C4-7 stenosis and herniated disc  . Scoliosis     slight  . H/O hiatal hernia   . Diabetes mellitus type II     sees [name]   . Neuromuscular disorder ([place])     hiatal hernia  . Arthritis     left hand  . Spinal cord injury, C5-C7 ([place])     c4-c7   Past Surgical History  Procedure Laterality Date  . Colonoscopy  10-30-14    per Dr. Olevia Perches, clear, repeat in  10 yrs   . Lymph nodes biopsy    . Melanoma rt calf  1999  . Egd with esophageal dilation  9-08    per Dr. Olevia Perches  . Tonsillectomy      as a child  . Anterior cervical decomp/discectomy fusion  08/18/2011    Procedure: ANTERIOR CERVICAL DECOMPRESSION/DISCECTOMY FUSION 3 LEVELS;  Surgeon: Winfield Cunas, Bush;  Location: [facility];  Service: Neurosurgery;  Laterality: N/A;  Anterior Cervical Four-Five/Five-Six/Six-Seven Decompression with Fusion, Plating, and Bonegraft  . Carpal tunnel release  2013    bilateral, per Dr. Christella Noa   . Ulnar tunnel release  2013    right arm, per Dr. Christella Noa   . Spine surgery    . Upper gastrointestinal endoscopy     No Known Allergies Prior to Admission medications   Medication Sig Start Date End Date Taking? Authorizing Provider  amLODipine-benazepril (LOTREL) 10-20 MG per capsule TAKE 1 CAPSULE EVERY DAY 02/04/15  Yes [name], Bush  aspirin 81 MG tablet Take 81 mg by mouth daily.     Yes Historical Provider, Bush  FREESTYLE LITE test strip USE ONE STRIP ONCE DAILY TO CHECK BLOOD SUGAR 04/02/15  Yes [name], Bush  glipiZIDE (GLUCOTROL XL) 2.5 MG 24 hr tablet Take 1 tablet (2.5 mg total) by mouth daily with breakfast. 04/15/15  Yes [name], Bush  JARDIANCE 25 MG TABS tablet TAKE 1 TABLET BY MOUTH EVERY DAY 10/06/15  Yes [name], Bush  Lancets (FREESTYLE) lancets USE AS INSTRUCTED TO CHECK BLOOD SUGAR ONCE A DAY. 11/27/14  Yes [name], Bush  metFORMIN (GLUCOPHAGE-XR) 750 MG 24 hr tablet TAKE 2 TABLETS (1,500 MG TOTAL) BY MOUTH DAILY WITH BREAKFAST. 09/17/15  Yes [name], Bush  Multiple Vitamin (MULTIVITAMIN) tablet Take 1 tablet by mouth daily. One a Day 50 +   Yes Historical Provider, Bush  Omega-3 Fatty Acids (FISH OIL PO) Take 1 capsule by mouth daily.     Yes Historical Provider, Bush  omeprazole (PRILOSEC) 20 MG capsule TAKE ONE CAPSULE BY MOUTH ONCE DAILY 09/06/15  Yes [name], Bush  pravastatin (PRAVACHOL) 40 MG tablet TAKE 1 TABLET (40 MG TOTAL) BY MOUTH  DAILY. 10/15/14  Yes [name], Bush  TRULICITY A999333 0000000 SOPN INJECT 1 PEN WEEKLY 07/21/15  Yes [name], Bush   Social History   Social History  . Marital Status: Married    Spouse Name: N/A  . Number of Children: N/A  . Years of Education: N/A   Occupational History  . Not on file.   Social History Main Topics  . Smoking status: Never Smoker   . Smokeless tobacco: Never Used     Comment: tried a pipe 35 years ago   . Alcohol Use: No  . Drug Use: No  . Sexual Activity: Yes   Other Topics Concern  . Not on file   Social History Narrative    Review of Systems  Constitutional: Negative for fever and chills.  Eyes: Negative for pain, redness and itching.  Respiratory: Negative for cough and shortness of breath.   Gastrointestinal: Negative for nausea and vomiting.  Musculoskeletal: Positive for arthralgias. Negative for neck pain and neck stiffness.       Objective:   Physical Exam  Constitutional: He is oriented to person, place, and time. He appears well-developed and well-nourished. No distress.  HENT:  Head: Normocephalic and atraumatic.  Cerumen build up in right ear.   Eyes: Pupils are equal, round, and reactive to light.  Cardiovascular: Normal rate and regular rhythm.  Exam reveals no friction rub.   No murmur heard. Pulses:      Dorsalis pedis pulses are 2+ on the right side, and 2+ on the left side.       Posterior tibial pulses are 2+ on the right side, and 2+ on the left side.  Pulmonary/Chest: Effort normal and breath sounds normal. No respiratory distress. He has no wheezes. He has no rales.  Neurological: He is alert and oriented to person, place, and time.  Skin: Skin is warm and dry.  Psychiatric: He has a normal mood and affect. His behavior is normal.    Filed Vitals:   10/07/15 1118  BP: 118/72  Pulse: 85  Temp: 98.3 F (36.8 C)  TempSrc: Oral  Resp: 18  Height: 5' 10.75" (1.797 m)  Weight: 226 lb 9.6 oz (102.785 kg)  SpO2: 95%          Assessment & Plan:   This chart was scribed in my presence and reviewed by me personally.    ICD-9-CM ICD-10-CM   1. Annual physical exam V70.0 Z00.00      Signed, Colton Haber,  Bush   

## 2015-10-07 NOTE — Progress Notes (Signed)
Pre visit review using our clinic review tool, if applicable. No additional management support is needed unless otherwise documented below in the visit note. 

## 2015-10-07 NOTE — Progress Notes (Signed)
   Subjective:    Patient ID: Colton Bush, male    DOB: 19-Sep-1955, 59 y.o.   MRN: [id number]  HPI 60 yr old male for a cpx. He feels well in general. He asks for a referral to someone who can trim is toenails. He has fungal involvement in the nails and they are thick and curled. He sees [name] regularly for his diabetes. His last A1c in December was 6.9. He still works full time.    Review of Systems  Constitutional: Negative.   HENT: Negative.   Eyes: Negative.   Respiratory: Negative.   Cardiovascular: Negative.   Gastrointestinal: Negative.   Genitourinary: Negative.   Musculoskeletal: Negative.   Skin: Negative.   Neurological: Negative.   Psychiatric/Behavioral: Negative.        Objective:   Physical Exam  Constitutional: He is oriented to person, place, and time. He appears well-developed and well-nourished. No distress.  HENT:  Head: Normocephalic and atraumatic.  Right Ear: External ear normal.  Left Ear: External ear normal.  Nose: Nose normal.  Mouth/Throat: Oropharynx is clear and moist. No oropharyngeal exudate.  Eyes: Conjunctivae and EOM are normal. Pupils are equal, round, and reactive to light. Right eye exhibits no discharge. Left eye exhibits no discharge. No scleral icterus.  Neck: Neck supple. No JVD present. No tracheal deviation present. No thyromegaly present.  Cardiovascular: Normal rate, regular rhythm, normal heart sounds and intact distal pulses.  Exam reveals no gallop and no friction rub.   No murmur heard. EKG normal   Pulmonary/Chest: Effort normal and breath sounds normal. No respiratory distress. He has no wheezes. He has no rales. He exhibits no tenderness.  Abdominal: Soft. Bowel sounds are normal. He exhibits no distension and no mass. There is no tenderness. There is no rebound and no guarding.  Genitourinary: Rectum normal, prostate normal and penis normal. Guaiac negative stool. No penile tenderness.  Musculoskeletal: Normal range  of motion. He exhibits no edema or tenderness.  Lymphadenopathy:    He has no cervical adenopathy.  Neurological: He is alert and oriented to person, place, and time. He has normal reflexes. No cranial nerve deficit. He exhibits normal muscle tone. Coordination normal.  Skin: Skin is warm and dry. No rash noted. He is not diaphoretic. No erythema. No pallor.  All toenails are yellow and thickened   Psychiatric: He has a normal mood and affect. His behavior is normal. Judgment and thought content normal.          Assessment & Plan:  Well exam. Get fasting labs. Refer to Podiatry for his nail care.

## 2015-10-15 ENCOUNTER — Other Ambulatory Visit: Payer: Self-pay | Admitting: Endocrinology

## 2015-10-21 ENCOUNTER — Other Ambulatory Visit: Payer: Self-pay | Admitting: Endocrinology

## 2015-10-27 ENCOUNTER — Other Ambulatory Visit: Payer: Self-pay | Admitting: Endocrinology

## 2015-11-01 ENCOUNTER — Other Ambulatory Visit (INDEPENDENT_AMBULATORY_CARE_PROVIDER_SITE_OTHER): Payer: 59

## 2015-11-01 DIAGNOSIS — E119 Type 2 diabetes mellitus without complications: Secondary | ICD-10-CM

## 2015-11-01 LAB — COMPREHENSIVE METABOLIC PANEL
ALT: 28 U/L (ref 0–53)
AST: 18 U/L (ref 0–37)
Albumin: 4.6 g/dL (ref 3.5–5.2)
Alkaline Phosphatase: 56 U/L (ref 39–117)
BUN: 20 mg/dL (ref 6–23)
CO2: 28 mEq/L (ref 19–32)
Calcium: 9.8 mg/dL (ref 8.4–10.5)
Chloride: 107 mEq/L (ref 96–112)
Creatinine, Ser: 0.75 mg/dL (ref 0.40–1.50)
GFR: 112.12 mL/min (ref >60.00)
Glucose, Bld: 120 mg/dL — ABNORMAL HIGH (ref 70–99)
Potassium: 4.4 mEq/L (ref 3.5–5.1)
Sodium: 141 mEq/L (ref 135–145)
Total Bilirubin: 0.4 mg/dL (ref 0.2–1.2)
Total Protein: 7.7 g/dL (ref 6.0–8.3)

## 2015-11-01 LAB — LIPID PANEL
Cholesterol: 126 mg/dL (ref 0–200)
HDL: 35.9 mg/dL — ABNORMAL LOW (ref >39.00)
LDL Cholesterol: 76 mg/dL (ref 0–99)
NonHDL: 90.12
Total CHOL/HDL Ratio: 4
Triglycerides: 69 mg/dL (ref 0.0–149.0)
VLDL: 13.8 mg/dL (ref 0.0–40.0)

## 2015-11-01 LAB — MICROALBUMIN / CREATININE URINE RATIO
Creatinine,U: 44.9 mg/dL
Microalb Creat Ratio: 7.1 mg/g (ref 0.0–30.0)
Microalb, Ur: 3.2 mg/dL — ABNORMAL HIGH (ref 0.0–1.9)

## 2015-11-01 LAB — HEMOGLOBIN A1C: Hgb A1c MFr Bld: 7.4 % — ABNORMAL HIGH (ref 4.6–6.5)

## 2015-11-04 ENCOUNTER — Encounter: Payer: Self-pay | Admitting: Podiatry

## 2015-11-04 ENCOUNTER — Ambulatory Visit (INDEPENDENT_AMBULATORY_CARE_PROVIDER_SITE_OTHER): Payer: 59 | Admitting: Podiatry

## 2015-11-04 ENCOUNTER — Ambulatory Visit (INDEPENDENT_AMBULATORY_CARE_PROVIDER_SITE_OTHER): Payer: 59 | Admitting: Endocrinology

## 2015-11-04 ENCOUNTER — Encounter: Payer: Self-pay | Admitting: Endocrinology

## 2015-11-04 VITALS — BP 169/99 | HR 79 | Resp 16 | Ht 71.0 in | Wt 227.0 lb

## 2015-11-04 VITALS — BP 124/72 | HR 80 | Temp 98.8°F | Resp 16 | Ht 70.0 in | Wt 227.0 lb

## 2015-11-04 DIAGNOSIS — L600 Ingrowing nail: Secondary | ICD-10-CM

## 2015-11-04 DIAGNOSIS — E1165 Type 2 diabetes mellitus with hyperglycemia: Secondary | ICD-10-CM

## 2015-11-04 NOTE — Patient Instructions (Addendum)
Check blood sugars on waking up 2-3  times a week Also check blood sugars about 2 hours after a meal and do this after different meals by rotation  Recommended blood sugar levels on waking up is 90-130 and about 2 hours after meal is 130-160  Please bring your blood sugar monitor to each visit, thank you  WALK DAILY  Trulicity 1.5mg  weekly; 2 shots at a time, call for new rx

## 2015-11-04 NOTE — Patient Instructions (Signed)

## 2015-11-04 NOTE — Progress Notes (Signed)
   Subjective:    Patient ID: Colton Bush, male    DOB: [birth year], 60 y.o.   MRN: [id number]  HPI Patient presents with bilateral nail problem.  On Left foot, 2nd toe; Right foot-great toe; nail discoloration & thickened nail   Review of Systems  All other systems reviewed and are negative.      Objective:   Physical Exam        Assessment & Plan:

## 2015-11-04 NOTE — Progress Notes (Signed)
Patient ID: Colton Bush, male   DOB: 29-May-1956, 60 y.o.   MRN: [id number]   Reason for Appointment : Followup for Type 2 Diabetes  History of Present Illness          Diagnosis: Type 2 diabetes mellitus, date of diagnosis:1999        Past history: He was probably started on metformin and diagnosis and is still taking this. At some point he was also given glipizide. Apparently his blood sugars have been getting her relatively higher over the years The last couple of years he has had difficulty with affording medication because of disability and not getting insurance He had been on Januvia for the previous 2-3 years with variable control. A1c has been over 7% since 2012 He had significant hyperglycemia occurring since at least 2014 because of difficulty getting his medications Because of a markedly increased A1c of 10.8 despite taking his glipizide and Janumet in 2/15 he was changed to a regimen of Glipizide ER 10 mg, metformin ER 2000 mg, Invokana 300 mg. Also had been given Trulicity initially but he stopped taking it on his own    Recent history:   He is on multiple drugs including Trulicity with fairly good control this year and A1c about 7% consistently Because of his tendency to weight gain and multiple benefits he was changed from [place] to Trulicity in [id number] With this he initially lost weight and A1c was down below 7% However his weight is leveled off but his A1c has gone up to 7.4  Current blood sugar patterns and problems identified:  Again he is checking his blood sugar only in the morning without any readings after meals.  This has been discussed on each visit  Fasting readings are on an average the same as last time  No hypoglycemia with low dose glipizide ER 2.5 mg   He still does not do any walking for exercise despite reminders  Oral hypoglycemic drugs the patient is taking are: Glipizide ER, Jardiance 25   Side effects from medications have been: None     Glucose monitoring:  done once a day        Glucometer: Freestyle    Blood Glucose readings: range 96-156 with mean 119, all fasting readings  Hypoglycemia: none    Glycemic control:   Lab Results  Component Value Date   HGBA1C 7.4* 11/01/2015   HGBA1C 6.9* 07/08/2015   HGBA1C 7.1* 02/11/2015   Lab Results  Component Value Date   MICROALBUR 3.2* 11/01/2015   LDLCALC 76 11/01/2015   CREATININE 0.75 11/01/2015    Self-care: The diet that the patient has been following is: tries to limit fats.  Breakfast is peanut butter and toast, lunch is a sandwich and dinner is usually baked chicken.     Meals: 3 meals per day. Dinner 6-7 pm          Exercise: minimal        Dietician visit: 4/15              Weight history:  Wt Readings from Last 3 Encounters:  11/04/15 227 lb (102.967 kg)  11/04/15 227 lb (102.967 kg)  10/07/15 226 lb 9.6 oz (102.785 kg)       Medication List       This list is accurate as of: 11/04/15  3:51 PM.  Always use your most recent med list.               amLODipine-benazepril  10-20 MG capsule  Commonly known as:  LOTREL  TAKE 1 CAPSULE EVERY DAY     aspirin 81 MG tablet  Take 81 mg by mouth daily.     FISH OIL PO  Take 1 capsule by mouth daily.     freestyle lancets  USE AS INSTRUCTED TO CHECK BLOOD SUGAR ONCE A DAY.     FREESTYLE LITE test strip  Generic drug:  glucose blood  USE ONE STRIP TO CHECK GLUCOSE ONCE DAILY     glipiZIDE 2.5 MG 24 hr tablet  Commonly known as:  GLUCOTROL XL  Take 1 tablet (2.5 mg total) by mouth daily with breakfast.     JARDIANCE 25 MG Tabs tablet  Generic drug:  empagliflozin  TAKE 1 TABLET BY MOUTH EVERY DAY     metFORMIN 750 MG 24 hr tablet  Commonly known as:  GLUCOPHAGE-XR  TAKE 2 TABLETS (1,500 MG TOTAL) BY MOUTH DAILY WITH BREAKFAST.     multivitamin tablet  Take 1 tablet by mouth daily. One a Day 50 +     omeprazole 20 MG capsule  Commonly known as:  PRILOSEC  TAKE ONE CAPSULE BY  MOUTH ONCE DAILY     pravastatin 40 MG tablet  Commonly known as:  PRAVACHOL  TAKE 1 TABLET (40 MG TOTAL) BY MOUTH DAILY.     TRULICITY A999333 0000000 Sopn  Generic drug:  Dulaglutide  INJECT 1 PEN WEEKLY        Allergies: No Known Allergies  Past Medical History  Diagnosis Date  . Hyperlipidemia     takes Zocor daily  . Hypertension     takes Amlodipine daily  . GERD (gastroesophageal reflux disease)     takes Omeprazole daily  . Depression     takes Cymbalta daily  . Bronchitis 1977  . Stiffness of hand joint     d/t cervical issues  . Neck pain     C4-7 stenosis and herniated disc  . Scoliosis     slight  . H/O hiatal hernia   . Diabetes mellitus type II     sees [name]   . Neuromuscular disorder ([name])     hiatal hernia  . Arthritis     left hand  . Spinal cord injury, C5-C7 ([place])     c4-c7    Past Surgical History  Procedure Laterality Date  . Colonoscopy  10-30-14    per Dr. Olevia Perches, clear, repeat in 10 yrs   . Lymph nodes biopsy    . Melanoma rt calf  1999  . Egd with esophageal dilation  9-08    per Dr. Olevia Perches  . Tonsillectomy      as a child  . Anterior cervical decomp/discectomy fusion  08/18/2011    Procedure: ANTERIOR CERVICAL DECOMPRESSION/DISCECTOMY FUSION 3 LEVELS;  Surgeon: Winfield Cunas, MD;  Location: [facility];  Service: Neurosurgery;  Laterality: N/A;  Anterior Cervical Four-Five/Five-Six/Six-Seven Decompression with Fusion, Plating, and Bonegraft  . Carpal tunnel release  2013    bilateral, per Dr. Christella Noa   . Ulnar tunnel release  2013    right arm, per Dr. Christella Noa   . Spine surgery    . Upper gastrointestinal endoscopy      Family History  Problem Relation Age of Onset  . Heart disease Father   . Heart disease Brother 88  . Anesthesia problems Mother   . Heart disease Mother   . Dementia Mother   . Diabetes Sister   . Colon  cancer Neg Hx   . Rectal cancer Neg Hx   . Stomach cancer Neg Hx   . Stroke Sister     Social  History:  reports that he has never smoked. He has never used smokeless tobacco. He reports that he does not drink alcohol or use illicit drugs.    Review of Systems   Hypertension: This is controlled with Lotrel.   Lipids:   He has been on pravastatin 40 mg, he tends to have low HDL LDL 76  Lab Results  Component Value Date   CHOL 126 11/01/2015   HDL 35.90* 11/01/2015   LDLCALC 76 11/01/2015   LDLDIRECT 138.7 08/12/2013   TRIG 69.0 11/01/2015   CHOLHDL 4 11/01/2015                    Physical Examination:  BP 124/72 mmHg  Pulse 80  Temp(Src) 98.8 F (37.1 C)  Resp 16  Ht 5\' 10"  (1.778 m)  Wt 227 lb (102.967 kg)  BMI 32.57 kg/m2  SpO2 95%     ASSESSMENT/PLAN:   Diabetes type 2, uncontrolled   See history of present illness for detailed discussion of his current management, blood sugar patterns and problems identified Although his A1c is not significantly higher it is still going up and most likely represents higher postprandial readings which he does not monitor as usual He is not very consistent with his portions of meals at times and likely has high readings based on his diet  Since she is tolerating the A999333 Trulicity quite well will increase it to 1.5, he can use of the 0.75 supply at home with doing injections at a time Encouraged him to exercise regularly   HYPERTENSION and hypercholesterolemia: Well controlled   Patient Instructions  Check blood sugars on waking up 2-3  times a week Also check blood sugars about 2 hours after a meal and do this after different meals by rotation  Recommended blood sugar levels on waking up is 90-130 and about 2 hours after meal is 130-160  Please bring your blood sugar monitor to each visit, thank you  WALK DAILY  Trulicity 1.5mg  weekly; 2 shots at a time, call for new rx         [facility] 11/04/2015, 3:51 PM      Lab on 11/01/2015  Component Date Value Ref Range Status  . Hgb A1c MFr Bld 11/01/2015  7.4* 4.6 - 6.5 % Final   Glycemic Control Guidelines for People with Diabetes:Non Diabetic:  <6%Goal of Therapy: <7%Additional Action Suggested:  >8%   . Sodium 11/01/2015 141  135 - 145 mEq/L Final  . Potassium 11/01/2015 4.4  3.5 - 5.1 mEq/L Final  . Chloride 11/01/2015 107  96 - 112 mEq/L Final  . CO2 11/01/2015 28  19 - 32 mEq/L Final  . Glucose, Bld 11/01/2015 120* 70 - 99 mg/dL Final  . BUN 11/01/2015 20  6 - 23 mg/dL Final  . Creatinine, Ser 11/01/2015 0.75  0.40 - 1.50 mg/dL Final  . Total Bilirubin 11/01/2015 0.4  0.2 - 1.2 mg/dL Final  . Alkaline Phosphatase 11/01/2015 56  39 - 117 U/L Final  . AST 11/01/2015 18  0 - 37 U/L Final  . ALT 11/01/2015 28  0 - 53 U/L Final  . Total Protein 11/01/2015 7.7  6.0 - 8.3 g/dL Final  . Albumin 11/01/2015 4.6  3.5 - 5.2 g/dL Final  . Calcium 11/01/2015 9.8  8.4 - 10.5 mg/dL Final  .  GFR 11/01/2015 112.12  >60.00 mL/min Final  . Cholesterol 11/01/2015 126  0 - 200 mg/dL Final   ATP III Classification       Desirable:  < 200 mg/dL               Borderline High:  200 - 239 mg/dL          High:  > = 240 mg/dL  . Triglycerides 11/01/2015 69.0  0.0 - 149.0 mg/dL Final   Normal:  <150 mg/dLBorderline High:  150 - 199 mg/dL  . HDL 11/01/2015 35.90* >39.00 mg/dL Final  . VLDL 11/01/2015 13.8  0.0 - 40.0 mg/dL Final  . LDL Cholesterol 11/01/2015 76  0 - 99 mg/dL Final  . Total CHOL/HDL Ratio 11/01/2015 4   Final                  Men          Women1/2 Average Risk     3.4          3.3Average Risk          5.0          4.42X Average Risk          9.6          7.13X Average Risk          15.0          11.0                      . NonHDL 11/01/2015 90.12   Final   NOTE:  Non-HDL goal should be 30 mg/dL higher than patient's LDL goal (i.e. LDL goal of < 70 mg/dL, would have non-HDL goal of < 100 mg/dL)  . Microalb, Ur 11/01/2015 3.2* 0.0 - 1.9 mg/dL Final  . Creatinine,U 11/01/2015 44.9   Final  . Microalb Creat Ratio 11/01/2015 7.1  0.0 - 30.0 mg/g Final

## 2015-11-05 NOTE — Progress Notes (Signed)
Subjective:     Patient ID: Colton Bush, male   DOB: 24-Jan-1956, 60 y.o.   MRN: [id number]  HPI patient presents with trauma to the left big toe and second toe with nail disease thickness and inability for the patient to cut. The right hallux also has moderate trauma but is not as sore as the left hallux her left second toe. Has been present for a long time and worsening over the last year   Review of Systems  All other systems reviewed and are negative.      Objective:   Physical Exam  Constitutional: He is oriented to person, place, and time.  Cardiovascular: Intact distal pulses.   Musculoskeletal: Normal range of motion.  Neurological: He is oriented to person, place, and time.  Skin: Skin is warm.  Nursing note and vitals reviewed.  neurovascular status intact muscle strength adequate range of motion within normal limits with patient found to have thickness and dystrophic changes of the left hallux nail and the left second nail with elongation second nail and pain. The right hallux also has damage with moderate pain but not as severe as the left. Good digital perfusion and well oriented 3     Assessment:     Damaged hallux nail left second nail left with moderate damage to the right big toenail    Plan:     H&P and condition reviewed with patient. I've recommended strictly working on the left foot first and this point I recommended removal of the hallux nail and the second nail with permanent procedure and explained procedure and risk to patient. Patient wants procedure understanding risk and at this time I infiltrated each toe with 60 mg Xylocaine Marcaine mixture and remove the hallux and second nails and exposed matrix applying phenol 3 applications to each followed by alcohol lavage and sterile dressing. Gave instructions on soaks and reappoint

## 2015-11-16 ENCOUNTER — Telehealth: Payer: Self-pay | Admitting: *Deleted

## 2015-11-16 NOTE — Telephone Encounter (Signed)
Left message for patient at [phone] (Home #) to check to see how they were doing from their ingrown toenail procedure that was performed on Thursday, November 04, 2015. Waiting for a response.

## 2015-11-21 ENCOUNTER — Other Ambulatory Visit: Payer: Self-pay | Admitting: Endocrinology

## 2015-12-08 ENCOUNTER — Other Ambulatory Visit: Payer: Self-pay | Admitting: *Deleted

## 2015-12-08 ENCOUNTER — Telehealth: Payer: Self-pay | Admitting: Endocrinology

## 2015-12-08 MED ORDER — DULAGLUTIDE 1.5 MG/0.5ML ~~LOC~~ SOAJ: SUBCUTANEOUS | Status: DC

## 2015-12-08 NOTE — Telephone Encounter (Signed)
1.5 mg weekly of Trulicity

## 2015-12-08 NOTE — Telephone Encounter (Signed)
Did you want his dose increased?

## 2015-12-08 NOTE — Telephone Encounter (Signed)
PT called and said his Trulicity Rx is out, he was told to call because he said that [name] might possibly up his dosage. Pharmacy, [facility]

## 2015-12-08 NOTE — Telephone Encounter (Signed)
Noted, rx sent.

## 2016-01-07 ENCOUNTER — Other Ambulatory Visit: Payer: Self-pay

## 2016-01-07 MED ORDER — DULAGLUTIDE 1.5 MG/0.5ML ~~LOC~~ SOAJ: SUBCUTANEOUS | Status: DC

## 2016-01-12 ENCOUNTER — Other Ambulatory Visit: Payer: Self-pay

## 2016-01-12 MED ORDER — DULAGLUTIDE 1.5 MG/0.5ML ~~LOC~~ SOAJ: SUBCUTANEOUS | Status: DC

## 2016-01-30 ENCOUNTER — Other Ambulatory Visit: Payer: Self-pay | Admitting: Family Medicine

## 2016-01-31 NOTE — Telephone Encounter (Signed)
Refill sent to pharmacy.   

## 2016-02-07 ENCOUNTER — Other Ambulatory Visit (INDEPENDENT_AMBULATORY_CARE_PROVIDER_SITE_OTHER): Payer: 59

## 2016-02-07 DIAGNOSIS — E1165 Type 2 diabetes mellitus with hyperglycemia: Secondary | ICD-10-CM | POA: Diagnosis not present

## 2016-02-07 LAB — BASIC METABOLIC PANEL
BUN: 27 mg/dL — ABNORMAL HIGH (ref 6–23)
CO2: 23 mEq/L (ref 19–32)
Calcium: 9.8 mg/dL (ref 8.4–10.5)
Chloride: 98 mEq/L (ref 96–112)
Creatinine, Ser: 0.79 mg/dL (ref 0.40–1.50)
GFR: 105.5 mL/min (ref >60.00)
Glucose, Bld: 307 mg/dL — ABNORMAL HIGH (ref 70–99)
Potassium: 4.9 mEq/L (ref 3.5–5.1)
Sodium: 133 mEq/L — ABNORMAL LOW (ref 135–145)

## 2016-02-07 LAB — HEMOGLOBIN A1C: Hgb A1c MFr Bld: 7.9 % — ABNORMAL HIGH (ref 4.6–6.5)

## 2016-02-10 ENCOUNTER — Encounter: Payer: Self-pay | Admitting: Endocrinology

## 2016-02-10 ENCOUNTER — Other Ambulatory Visit: Payer: Self-pay

## 2016-02-10 ENCOUNTER — Ambulatory Visit (INDEPENDENT_AMBULATORY_CARE_PROVIDER_SITE_OTHER): Payer: 59 | Admitting: Endocrinology

## 2016-02-10 VITALS — BP 136/80 | HR 92 | Ht 71.0 in | Wt 190.0 lb

## 2016-02-10 DIAGNOSIS — E1165 Type 2 diabetes mellitus with hyperglycemia: Secondary | ICD-10-CM | POA: Diagnosis not present

## 2016-02-10 DIAGNOSIS — R634 Abnormal weight loss: Secondary | ICD-10-CM

## 2016-02-10 MED ORDER — DULAGLUTIDE 0.75 MG/0.5ML ~~LOC~~ SOAJ: SUBCUTANEOUS | Status: DC

## 2016-02-10 MED ORDER — GLIPIZIDE ER 10 MG PO TB24: ORAL_TABLET | ORAL | Status: DC

## 2016-02-10 NOTE — Patient Instructions (Signed)
Glipizide ER 10mg  , reduce Truklicity to [id number]  Check blood sugars on waking up 4  times a week Also check blood sugars about 2 hours after a meal and do this after different meals by rotation  Recommended blood sugar levels on waking up is 90-130 and about 2 hours after meal is 130-160  Please bring your blood sugar monitor to each visit, thank you

## 2016-02-10 NOTE — Progress Notes (Signed)
Patient ID: Colton Bush, male   DOB: [birth year], 60 y.o.   MRN: [id number]   Reason for Appointment : Followup for Type 2 Diabetes  History of Present Illness          Diagnosis: Type 2 diabetes mellitus, date of diagnosis:1999        Past history: He was probably started on metformin and diagnosis and is still taking this. At some point he was also given glipizide. Apparently his blood sugars have been getting her relatively higher over the years The last couple of years he has had difficulty with affording medication because of disability and not getting insurance He had been on Januvia for the previous 2-3 years with variable control. A1c has been over 7% since 2012 He had significant hyperglycemia occurring since at least 2014 because of difficulty getting his medications Because of a markedly increased A1c of 10.8 despite taking his glipizide and Janumet in 2/15 he was changed to a regimen of Glipizide ER 10 mg, metformin ER 2000 mg, Invokana 300 mg.   Because of his tendency to weight gain and multiple benefits he was changed from [place] to Trulicity in [id number]  Recent history:   He is on multiple drugs including Trulicity with fairly good control earlier this year  With this he initially lost weight and A1c was down below 7% However his A1c is appearing progressively higher even with increasing his Trulicity up to 1.5 mg on his last visit  Current blood sugar patterns and problems identified:  He says that more recently has had minimal appetite without any nausea  He has drastically lost weight, also complains of feeling fatigued  He thinks he is trying to eat less because of tendency to high readings with any food  His blood sugars are overall higher fasting compared to his last visit despite increasing Trulicity  Again he is checking his blood sugar only in the morning and not after meals as discussed on each visit  His postprandial reading in the lab was 307     He still does not do any walking for exercise despite reminders  Oral hypoglycemic drugs the patient is taking are: Glipizide ER, Jardiance 25   Side effects from medications have been: None   Glucose monitoring:  done once a day        Glucometer: Freestyle    Blood Glucose readings: FASTING range 127-234 with average 186   Glycemic control:   Lab Results  Component Value Date   HGBA1C 7.9* 02/07/2016   HGBA1C 7.4* 11/01/2015   HGBA1C 6.9* 07/08/2015   Lab Results  Component Value Date   MICROALBUR 3.2* 11/01/2015   LDLCALC 76 11/01/2015   CREATININE 0.79 02/07/2016    Self-care: The diet that the patient has been following is: tries to limit fatsAnd portions .  Breakfast is peanut butter and toast, lunch is a sandwich and dinner is usually baked chicken.     Meals: 3 meals per day. Dinner 6-7 pm          Exercise: minimal, working long hours         Dietician visit: 4/15              Weight history:  Wt Readings from Last 3 Encounters:  02/10/16 190 lb (86.183 kg)  11/04/15 227 lb (102.967 kg)  11/04/15 227 lb (102.967 kg)    Lab on 02/07/2016  Component Date Value Ref Range Status  . Hgb A1c MFr Bld  02/07/2016 7.9* 4.6 - 6.5 % Final   Glycemic Control Guidelines for People with Diabetes:Non Diabetic:  <6%Goal of Therapy: <7%Additional Action Suggested:  >8%   . Sodium 02/07/2016 133* 135 - 145 mEq/L Final  . Potassium 02/07/2016 4.9  3.5 - 5.1 mEq/L Final  . Chloride 02/07/2016 98  96 - 112 mEq/L Final  . CO2 02/07/2016 23  19 - 32 mEq/L Final  . Glucose, Bld 02/07/2016 307* 70 - 99 mg/dL Final  . BUN 02/07/2016 27* 6 - 23 mg/dL Final  . Creatinine, Ser 02/07/2016 0.79  0.40 - 1.50 mg/dL Final  . Calcium 02/07/2016 9.8  8.4 - 10.5 mg/dL Final  . GFR 02/07/2016 105.50  >60.00 mL/min Final        Medication List       This list is accurate as of: 02/10/16 11:47 AM.  Always use your most recent med list.               amLODipine-benazepril 10-20  MG capsule  Commonly known as:  LOTREL  TAKE 1 CAPSULE BY MOUTH ONCE DAILY     aspirin 81 MG tablet  Take 81 mg by mouth daily.     Dulaglutide 1.5 MG/0.5ML Sopn  Commonly known as:  TRULICITY  Inject contents of one pen once per week     FISH OIL PO  Take 1 capsule by mouth daily.     freestyle lancets  USE AS INSTRUCTED TO CHECK BLOOD SUGAR ONCE A DAY.     FREESTYLE LITE test strip  Generic drug:  glucose blood  USE ONE STRIP TO CHECK GLUCOSE ONCE DAILY     glipiZIDE 2.5 MG 24 hr tablet  Commonly known as:  GLUCOTROL XL  TAKE 1 TABLET BY MOUTH EVERY DAY WITH BREAKFAST     JARDIANCE 25 MG Tabs tablet  Generic drug:  empagliflozin  TAKE 1 TABLET BY MOUTH EVERY DAY     metFORMIN 750 MG 24 hr tablet  Commonly known as:  GLUCOPHAGE-XR  TAKE 2 TABLETS (1,500 MG TOTAL) BY MOUTH DAILY WITH BREAKFAST.     multivitamin tablet  Take 1 tablet by mouth daily. One a Day 50 +     omeprazole 20 MG capsule  Commonly known as:  PRILOSEC  TAKE ONE CAPSULE BY MOUTH ONCE DAILY     pravastatin 40 MG tablet  Commonly known as:  PRAVACHOL  TAKE 1 TABLET (40 MG TOTAL) BY MOUTH DAILY.        Allergies: No Known Allergies  Past Medical History  Diagnosis Date  . Hyperlipidemia     takes Zocor daily  . Hypertension     takes Amlodipine daily  . GERD (gastroesophageal reflux disease)     takes Omeprazole daily  . Depression     takes Cymbalta daily  . Bronchitis 1977  . Stiffness of hand joint     d/t cervical issues  . Neck pain     C4-7 stenosis and herniated disc  . Scoliosis     slight  . H/O hiatal hernia   . Diabetes mellitus type II     sees [name]   . Neuromuscular disorder ([place])     hiatal hernia  . Arthritis     left hand  . Spinal cord injury, C5-C7 ([place])     c4-c7    Past Surgical History  Procedure Laterality Date  . Colonoscopy  10-30-14    per Dr. Olevia Perches, clear, repeat in 10 yrs   .  Lymph nodes biopsy    . Melanoma rt calf  1999  . Egd with  esophageal dilation  9-08    per Dr. Olevia Perches  . Tonsillectomy      as a child  . Anterior cervical decomp/discectomy fusion  08/18/2011    Procedure: ANTERIOR CERVICAL DECOMPRESSION/DISCECTOMY FUSION 3 LEVELS;  Surgeon: Winfield Cunas, MD;  Location: [facility];  Service: Neurosurgery;  Laterality: N/A;  Anterior Cervical Four-Five/Five-Six/Six-Seven Decompression with Fusion, Plating, and Bonegraft  . Carpal tunnel release  2013    bilateral, per Dr. Christella Noa   . Ulnar tunnel release  2013    right arm, per Dr. Christella Noa   . Spine surgery    . Upper gastrointestinal endoscopy      Family History  Problem Relation Age of Onset  . Heart disease Father   . Heart disease Brother 58  . Anesthesia problems Mother   . Heart disease Mother   . Dementia Mother   . Diabetes Sister   . Colon cancer Neg Hx   . Rectal cancer Neg Hx   . Stomach cancer Neg Hx   . Stroke Sister     Social History:  reports that he has never smoked. He has never used smokeless tobacco. He reports that he does not drink alcohol or use illicit drugs.    Review of Systems   Hypertension: This is controlled with Lotrel.   Lipids:   He has been on pravastatin 40 mg, he tends to have low HDL LDL 76  Lab Results  Component Value Date   CHOL 126 11/01/2015   HDL 35.90* 11/01/2015   LDLCALC 76 11/01/2015   LDLDIRECT 138.7 08/12/2013   TRIG 69.0 11/01/2015   CHOLHDL 4 11/01/2015                    Physical Examination:  BP 136/80 mmHg  Pulse 92  Ht 5\' 11"  (1.803 m)  Wt 190 lb (86.183 kg)  BMI 26.51 kg/m2  SpO2 97%     ASSESSMENT/PLAN:   Diabetes type 2, uncontrolled   See history of present illness for detailed discussion of his current management, blood sugar patterns and problems identified  He has had a large amount of unexpected weight loss since his last visit Even though his Trulicity was increased on his last visit this is very unlikely to be causing a drastic change in his appetite and  weight loss Also his blood sugars are overall higher, reaching 307 postprandially in the lab He has been afraid to eat also because of tendency to high readings  Discussed in detail with the patient that he likely has progression of his diabetes and insulin deficiency He is already on 4 drug regimen 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 even though 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 that 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  For now will increase his glipizide up to 10 mg and go back to [id number] Trulicity for the next month If his blood sugars are not improved he will go on insulin Again encouraged him to check some readings at bedtime and discussed that he is having higher readings after meals  Also encouraged him to follow-up with PCP if his appetite is not improved within the next couple of weeks  HYPERTENSION and hypercholesterolemia: Well controlled   Patient Instructions  Glipizide ER 10mg  , reduce Truklicity to [id number]  Check blood sugars on waking up 4  times a week Also check blood sugars about 2 hours after a meal and do this after different meals by rotation  Recommended blood sugar levels on waking up is 90-130 and about 2 hours after meal is 130-160  Please bring your blood sugar monitor to each visit, thank you    Counseling time on subjects discussed above is over 50% of today's 25 minute visit    Colton Bush 02/10/2016, 11:47 AM      Lab on 02/07/2016  Component Date Value Ref Range Status  . Hgb A1c MFr Bld 02/07/2016 7.9* 4.6 - 6.5 % Final   Glycemic Control Guidelines for People with Diabetes:Non Diabetic:  <6%Goal of Therapy: <7%Additional Action Suggested:  >8%   . Sodium 02/07/2016 133* 135 - 145 mEq/L Final  . Potassium 02/07/2016 4.9  3.5 - 5.1 mEq/L Final  . Chloride 02/07/2016 98  96 - 112 mEq/L Final  . CO2 02/07/2016 23  19 - 32 mEq/L Final  . Glucose, Bld 02/07/2016 307* 70 - 99 mg/dL Final  . BUN 02/07/2016 27* 6  - 23 mg/dL Final  . Creatinine, Ser 02/07/2016 0.79  0.40 - 1.50 mg/dL Final  . Calcium 02/07/2016 9.8  8.4 - 10.5 mg/dL Final  . GFR 02/07/2016 105.50  >60.00 mL/min Final

## 2016-02-23 ENCOUNTER — Ambulatory Visit (INDEPENDENT_AMBULATORY_CARE_PROVIDER_SITE_OTHER): Payer: 59 | Admitting: Family Medicine

## 2016-02-23 ENCOUNTER — Encounter: Payer: Self-pay | Admitting: Family Medicine

## 2016-02-23 VITALS — BP 110/68 | HR 106 | Temp 98.2°F | Ht 71.0 in | Wt 184.0 lb

## 2016-02-23 DIAGNOSIS — R634 Abnormal weight loss: Secondary | ICD-10-CM | POA: Diagnosis not present

## 2016-02-23 DIAGNOSIS — I10 Essential (primary) hypertension: Secondary | ICD-10-CM | POA: Diagnosis not present

## 2016-02-23 DIAGNOSIS — K759 Inflammatory liver disease, unspecified: Secondary | ICD-10-CM | POA: Diagnosis not present

## 2016-02-23 DIAGNOSIS — E119 Type 2 diabetes mellitus without complications: Secondary | ICD-10-CM | POA: Diagnosis not present

## 2016-02-23 LAB — BASIC METABOLIC PANEL
BUN: 24 mg/dL — ABNORMAL HIGH (ref 6–23)
CO2: 16 mEq/L — ABNORMAL LOW (ref 19–32)
Calcium: 10.2 mg/dL (ref 8.4–10.5)
Chloride: 94 mEq/L — ABNORMAL LOW (ref 96–112)
Creatinine, Ser: 0.89 mg/dL (ref 0.40–1.50)
GFR: 91.93 mL/min (ref >60.00)
Glucose, Bld: 293 mg/dL — ABNORMAL HIGH (ref 70–99)
Potassium: 5.8 mEq/L — ABNORMAL HIGH (ref 3.5–5.1)
Sodium: 131 mEq/L — ABNORMAL LOW (ref 135–145)

## 2016-02-23 LAB — CBC WITH DIFFERENTIAL/PLATELET
Basophils Absolute: 0 10*3/uL (ref 0.0–0.1)
Basophils Relative: 0.2 % (ref 0.0–3.0)
Eosinophils Absolute: 0 10*3/uL (ref 0.0–0.7)
Eosinophils Relative: 0.2 % (ref 0.0–5.0)
HCT: 38.4 % — ABNORMAL LOW (ref 39.0–52.0)
Hemoglobin: 12.7 g/dL — ABNORMAL LOW (ref 13.0–17.0)
Lymphocytes Relative: 18.1 % (ref 12.0–46.0)
Lymphs Abs: 1.9 10*3/uL (ref 0.7–4.0)
MCHC: 33 g/dL (ref 30.0–36.0)
MCV: 100.8 fl — ABNORMAL HIGH (ref 78.0–100.0)
Monocytes Absolute: 0.4 10*3/uL (ref 0.1–1.0)
Monocytes Relative: 4.3 % (ref 3.0–12.0)
Neutro Abs: 8 10*3/uL — ABNORMAL HIGH (ref 1.4–7.7)
Neutrophils Relative %: 77.2 % — ABNORMAL HIGH (ref 43.0–77.0)
Platelets: 402 10*3/uL — ABNORMAL HIGH (ref 150.0–400.0)
RBC: 3.81 Mil/uL — ABNORMAL LOW (ref 4.22–5.81)
RDW: 15.4 % (ref 11.5–15.5)
WBC: 10.3 10*3/uL (ref 4.0–10.5)

## 2016-02-23 LAB — POC URINALSYSI DIPSTICK (AUTOMATED)
Blood, UA: NEGATIVE
Leukocytes, UA: NEGATIVE
Nitrite, UA: NEGATIVE
Protein, UA: NEGATIVE
Spec Grav, UA: 1.01
Urobilinogen, UA: 1
pH, UA: 5

## 2016-02-23 LAB — AMYLASE: Amylase: 56 U/L (ref 27–131)

## 2016-02-23 LAB — HEPATIC FUNCTION PANEL
ALT: 242 U/L — ABNORMAL HIGH (ref 0–53)
AST: 196 U/L — ABNORMAL HIGH (ref 0–37)
Albumin: 3.8 g/dL (ref 3.5–5.2)
Alkaline Phosphatase: 740 U/L — ABNORMAL HIGH (ref 39–117)
Bilirubin, Direct: 4.6 mg/dL — ABNORMAL HIGH (ref 0.0–0.3)
Total Bilirubin: 7.6 mg/dL — ABNORMAL HIGH (ref 0.2–1.2)
Total Protein: 7 g/dL (ref 6.0–8.3)

## 2016-02-23 LAB — LIPASE: Lipase: 216 U/L — ABNORMAL HIGH (ref 11.0–59.0)

## 2016-02-23 NOTE — Progress Notes (Signed)
   Subjective:    Patient ID: Colton Bush, male    DOB: 07-23-1956, 60 y.o.   MRN: [id number]  HPI Here to evaluate rapid and unintentional weight loss. His weight had been stable for several years until 11-04-15 when he weighed 227 lbs. Since that time he has lost 43 lbs. He admits that his appetite is poor and that he has to force himself to eat sometimes. No nausea or abdominal pain. He has been seeing Dr. [name] for care of his diabetes, and this has been very difficult to mange. 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 Currently he is on a regimen of Jardiance, Trulicity, glipizide, and metformin. His last A1c a few weeks ago was 7.9,and his fasting glucoses jump from 150 to 240. He denies any other specific symptoms other than some fatigue and anorexia. He had a normal colonoscopy in 2016. He had normal labs including a PSA in March of this year.    Review of Systems  Constitutional: Positive for appetite change, fatigue and unexpected weight change. Negative for activity change and fever.  Eyes: Negative.   Respiratory: Negative.   Cardiovascular: Negative.   Gastrointestinal: Positive for nausea. Negative for abdominal distention, abdominal pain, blood in stool, constipation, diarrhea and vomiting.  Endocrine: Negative.   Genitourinary: Negative.   Neurological: Negative.   Hematological: Negative.        Objective:   Physical Exam  Constitutional: He is oriented to person, place, and time.  He looks gaunt and thin, has poor skin color, and his conjunctivae are muddy   Neck: Neck supple. No thyromegaly present.  Cardiovascular: Normal rate, regular rhythm, normal heart sounds and intact distal pulses.   Pulmonary/Chest: Effort normal and breath sounds normal.  Abdominal: Soft. Bowel sounds are normal. He exhibits no distension and no mass. There is no tenderness.  There is no rebound and no guarding.  No HSM  Musculoskeletal: He exhibits no edema.  Lymphadenopathy:    He has no cervical adenopathy.  Neurological: He is alert and oriented to person, place, and time.          Assessment & Plan:  He has had unintentional loss of 43 lbs in the past 3 months with poor appetite. He has had very marginal diabetic control as well. He thinks these are all side effects of Trulicity, since they started around the time he was placed on this. He asks to be referred to a different Endocrinologist, so we will arrange this transfer. Get labs today to look for other causes of weight loss including malignancy. Set up CT scans of the chest, abdomen, and pelvis soon. I will also refer him to Nutrition to help advise about diet.  [name], MD

## 2016-02-23 NOTE — Progress Notes (Signed)
Pre visit review using our clinic review tool, if applicable. No additional management support is needed unless otherwise documented below in the visit note. 

## 2016-02-24 LAB — HIV ANTIBODY (ROUTINE TESTING W REFLEX): HIV 1&2 Ab, 4th Generation: NONREACTIVE

## 2016-02-24 LAB — T3, FREE: T3, Free: 3.2 pg/mL (ref 2.3–4.2)

## 2016-02-24 LAB — T4, FREE: Free T4: 0.8 ng/dL (ref 0.60–1.60)

## 2016-02-24 LAB — TSH: TSH: 1.63 u[IU]/mL (ref 0.35–4.50)

## 2016-02-24 LAB — PSA: PSA: 0.75 ng/mL (ref 0.10–4.00)

## 2016-02-24 NOTE — Addendum Note (Signed)
Addended by: Alysia Penna A on: 02/24/2016 10:50 AM   Modules accepted: Orders

## 2016-02-25 ENCOUNTER — Other Ambulatory Visit (INDEPENDENT_AMBULATORY_CARE_PROVIDER_SITE_OTHER): Payer: 59

## 2016-02-25 DIAGNOSIS — K759 Inflammatory liver disease, unspecified: Secondary | ICD-10-CM

## 2016-02-26 LAB — HEPATITIS B SURFACE ANTIGEN: Hepatitis B Surface Ag: NEGATIVE

## 2016-02-26 LAB — HEPATITIS C ANTIBODY: HCV Ab: NEGATIVE

## 2016-02-26 LAB — HEPATITIS A ANTIBODY, TOTAL: Hep A Total Ab: NONREACTIVE

## 2016-02-26 LAB — HEPATITIS B SURFACE ANTIBODY,QUALITATIVE: Hep B S Ab: NEGATIVE

## 2016-02-27 ENCOUNTER — Emergency Department (HOSPITAL_COMMUNITY): Payer: 59

## 2016-02-27 ENCOUNTER — Encounter (HOSPITAL_COMMUNITY): Payer: Self-pay | Admitting: Emergency Medicine

## 2016-02-27 ENCOUNTER — Inpatient Hospital Stay (HOSPITAL_COMMUNITY)
Admission: EM | Admit: 2016-02-27 | Discharge: 2016-03-02 | DRG: 444 | Disposition: A | Discharge status: Home / Self Care | Payer: 59 | Attending: Internal Medicine | Admitting: Internal Medicine

## 2016-02-27 DIAGNOSIS — C259 Malignant neoplasm of pancreas, unspecified: Secondary | ICD-10-CM | POA: Diagnosis present

## 2016-02-27 DIAGNOSIS — Z833 Family history of diabetes mellitus: Secondary | ICD-10-CM | POA: Diagnosis not present

## 2016-02-27 DIAGNOSIS — IMO0001 Reserved for inherently not codable concepts without codable children: Secondary | ICD-10-CM

## 2016-02-27 DIAGNOSIS — K222 Esophageal obstruction: Secondary | ICD-10-CM | POA: Diagnosis present

## 2016-02-27 DIAGNOSIS — E1169 Type 2 diabetes mellitus with other specified complication: Secondary | ICD-10-CM

## 2016-02-27 DIAGNOSIS — Z7982 Long term (current) use of aspirin: Secondary | ICD-10-CM

## 2016-02-27 DIAGNOSIS — K869 Disease of pancreas, unspecified: Secondary | ICD-10-CM | POA: Diagnosis not present

## 2016-02-27 DIAGNOSIS — E875 Hyperkalemia: Secondary | ICD-10-CM | POA: Diagnosis present

## 2016-02-27 DIAGNOSIS — M419 Scoliosis, unspecified: Secondary | ICD-10-CM | POA: Diagnosis present

## 2016-02-27 DIAGNOSIS — Z823 Family history of stroke: Secondary | ICD-10-CM | POA: Diagnosis not present

## 2016-02-27 DIAGNOSIS — Z85820 Personal history of malignant melanoma of skin: Secondary | ICD-10-CM | POA: Diagnosis not present

## 2016-02-27 DIAGNOSIS — E871 Hypo-osmolality and hyponatremia: Secondary | ICD-10-CM | POA: Diagnosis present

## 2016-02-27 DIAGNOSIS — D72829 Elevated white blood cell count, unspecified: Secondary | ICD-10-CM | POA: Diagnosis present

## 2016-02-27 DIAGNOSIS — I10 Essential (primary) hypertension: Secondary | ICD-10-CM | POA: Diagnosis present

## 2016-02-27 DIAGNOSIS — C801 Malignant (primary) neoplasm, unspecified: Secondary | ICD-10-CM

## 2016-02-27 DIAGNOSIS — E1310 Other specified diabetes mellitus with ketoacidosis without coma: Secondary | ICD-10-CM | POA: Diagnosis present

## 2016-02-27 DIAGNOSIS — Z8249 Family history of ischemic heart disease and other diseases of the circulatory system: Secondary | ICD-10-CM | POA: Diagnosis not present

## 2016-02-27 DIAGNOSIS — E119 Type 2 diabetes mellitus without complications: Secondary | ICD-10-CM

## 2016-02-27 DIAGNOSIS — K831 Obstruction of bile duct: Principal | ICD-10-CM

## 2016-02-27 DIAGNOSIS — R17 Unspecified jaundice: Secondary | ICD-10-CM | POA: Diagnosis not present

## 2016-02-27 DIAGNOSIS — E782 Mixed hyperlipidemia: Secondary | ICD-10-CM | POA: Diagnosis present

## 2016-02-27 DIAGNOSIS — Z7984 Long term (current) use of oral hypoglycemic drugs: Secondary | ICD-10-CM | POA: Diagnosis not present

## 2016-02-27 DIAGNOSIS — E118 Type 2 diabetes mellitus with unspecified complications: Secondary | ICD-10-CM | POA: Diagnosis not present

## 2016-02-27 DIAGNOSIS — E1165 Type 2 diabetes mellitus with hyperglycemia: Secondary | ICD-10-CM

## 2016-02-27 DIAGNOSIS — K219 Gastro-esophageal reflux disease without esophagitis: Secondary | ICD-10-CM | POA: Diagnosis present

## 2016-02-27 DIAGNOSIS — K828 Other specified diseases of gallbladder: Secondary | ICD-10-CM | POA: Diagnosis present

## 2016-02-27 DIAGNOSIS — E876 Hypokalemia: Secondary | ICD-10-CM | POA: Diagnosis not present

## 2016-02-27 DIAGNOSIS — D509 Iron deficiency anemia, unspecified: Secondary | ICD-10-CM | POA: Diagnosis present

## 2016-02-27 DIAGNOSIS — Z79899 Other long term (current) drug therapy: Secondary | ICD-10-CM | POA: Diagnosis not present

## 2016-02-27 DIAGNOSIS — E1110 Type 2 diabetes mellitus with ketoacidosis without coma: Secondary | ICD-10-CM | POA: Diagnosis present

## 2016-02-27 DIAGNOSIS — K838 Other specified diseases of biliary tract: Secondary | ICD-10-CM | POA: Diagnosis not present

## 2016-02-27 DIAGNOSIS — F329 Major depressive disorder, single episode, unspecified: Secondary | ICD-10-CM | POA: Diagnosis present

## 2016-02-27 DIAGNOSIS — R5383 Other fatigue: Secondary | ICD-10-CM | POA: Diagnosis present

## 2016-02-27 DIAGNOSIS — R7989 Other specified abnormal findings of blood chemistry: Secondary | ICD-10-CM | POA: Diagnosis present

## 2016-02-27 LAB — CBC
HCT: 39.7 % (ref 39.0–52.0)
HCT: 37.3 % — ABNORMAL LOW (ref 39.0–52.0)
Hemoglobin: 12.7 g/dL — ABNORMAL LOW (ref 13.0–17.0)
Hemoglobin: 12 g/dL — ABNORMAL LOW (ref 13.0–17.0)
MCH: 33.8 pg (ref 26.0–34.0)
MCH: 33.7 pg (ref 26.0–34.0)
MCHC: 32 g/dL (ref 30.0–36.0)
MCHC: 32.2 g/dL (ref 30.0–36.0)
MCV: 105.6 fL — ABNORMAL HIGH (ref 78.0–100.0)
MCV: 104.8 fL — ABNORMAL HIGH (ref 78.0–100.0)
Platelets: 449 10*3/uL — ABNORMAL HIGH (ref 150–400)
Platelets: 401 10*3/uL — ABNORMAL HIGH (ref 150–400)
RBC: 3.76 MIL/uL — ABNORMAL LOW (ref 4.22–5.81)
RBC: 3.56 MIL/uL — ABNORMAL LOW (ref 4.22–5.81)
RDW: 14.8 % (ref 11.5–15.5)
RDW: 14.8 % (ref 11.5–15.5)
WBC: 12 10*3/uL — ABNORMAL HIGH (ref 4.0–10.5)
WBC: 11.1 10*3/uL — ABNORMAL HIGH (ref 4.0–10.5)

## 2016-02-27 LAB — BASIC METABOLIC PANEL
Anion gap: 17 — ABNORMAL HIGH (ref 5–15)
BUN: 37 mg/dL — ABNORMAL HIGH (ref 6–20)
CO2: 13 mmol/L — ABNORMAL LOW (ref 22–32)
Calcium: 9.1 mg/dL (ref 8.9–10.3)
Chloride: 102 mmol/L (ref 101–111)
Creatinine, Ser: 0.8 mg/dL (ref 0.61–1.24)
GFR calc Af Amer: >60 mL/min (ref >60)
GFR calc non Af Amer: >60 mL/min (ref >60)
Glucose, Bld: 147 mg/dL — ABNORMAL HIGH (ref 65–99)
Potassium: 4.8 mmol/L (ref 3.5–5.1)
Sodium: 132 mmol/L — ABNORMAL LOW (ref 135–145)

## 2016-02-27 LAB — COMPREHENSIVE METABOLIC PANEL
ALT: 336 U/L — ABNORMAL HIGH (ref 17–63)
AST: 159 U/L — ABNORMAL HIGH (ref 15–41)
Albumin: 3.4 g/dL — ABNORMAL LOW (ref 3.5–5.0)
Alkaline Phosphatase: 824 U/L — ABNORMAL HIGH (ref 38–126)
Anion gap: 20 — ABNORMAL HIGH (ref 5–15)
BUN: 38 mg/dL — ABNORMAL HIGH (ref 6–20)
CO2: 12 mmol/L — ABNORMAL LOW (ref 22–32)
Calcium: 9.7 mg/dL (ref 8.9–10.3)
Chloride: 99 mmol/L — ABNORMAL LOW (ref 101–111)
Creatinine, Ser: 0.95 mg/dL (ref 0.61–1.24)
GFR calc Af Amer: >60 mL/min (ref >60)
GFR calc non Af Amer: >60 mL/min (ref >60)
Glucose, Bld: 256 mg/dL — ABNORMAL HIGH (ref 65–99)
Potassium: 5.2 mmol/L — ABNORMAL HIGH (ref 3.5–5.1)
Sodium: 131 mmol/L — ABNORMAL LOW (ref 135–145)
Total Bilirubin: 11.2 mg/dL — ABNORMAL HIGH (ref 0.3–1.2)
Total Protein: 7.5 g/dL (ref 6.5–8.1)

## 2016-02-27 LAB — I-STAT TROPONIN, ED: Troponin i, poc: 0 ng/mL (ref 0.00–0.08)

## 2016-02-27 LAB — LIPASE, BLOOD: Lipase: 107 U/L — ABNORMAL HIGH (ref 11–51)

## 2016-02-27 LAB — PROTIME-INR
INR: 1
INR: 1.01
Prothrombin Time: 13.2 seconds (ref 11.4–15.2)
Prothrombin Time: 13.3 seconds (ref 11.4–15.2)

## 2016-02-27 LAB — MAGNESIUM: Magnesium: 2.4 mg/dL (ref 1.7–2.4)

## 2016-02-27 LAB — GLUCOSE, CAPILLARY
Glucose-Capillary: 153 mg/dL — ABNORMAL HIGH (ref 65–99)
Glucose-Capillary: 132 mg/dL — ABNORMAL HIGH (ref 65–99)

## 2016-02-27 MED ORDER — POLYETHYLENE GLYCOL 3350 17 G PO PACK: 17.0000 g | PACK | Freq: Every day | ORAL | Status: DC | PRN

## 2016-02-27 MED ORDER — DEXTROSE-NACL 5-0.45 % IV SOLN
INTRAVENOUS | Status: DC
  Administered 2016-02-27: 22:00:00 via INTRAVENOUS

## 2016-02-27 MED ORDER — ENOXAPARIN SODIUM 40 MG/0.4ML ~~LOC~~ SOLN
40.0000 mg | SUBCUTANEOUS | Status: DC
  Administered 2016-02-27 – 2016-02-28 (×2): 40 mg via SUBCUTANEOUS
  Filled 2016-02-27 (×2): qty 0.4

## 2016-02-27 MED ORDER — ADULT MULTIVITAMIN W/MINERALS CH
1.0000 | ORAL_TABLET | Freq: Every day | ORAL | Status: DC
  Administered 2016-02-28 – 2016-03-02 (×3): 1 via ORAL
  Filled 2016-02-27 (×3): qty 1

## 2016-02-27 MED ORDER — SODIUM CHLORIDE 0.9 % IV SOLN
INTRAVENOUS | Status: DC
  Administered 2016-02-28: 17:00:00 via INTRAVENOUS

## 2016-02-27 MED ORDER — IOPAMIDOL (ISOVUE-300) INJECTION 61%
100.0000 mL | Freq: Once | INTRAVENOUS | Status: AC | PRN
  Administered 2016-02-27: 100 mL via INTRAVENOUS

## 2016-02-27 MED ORDER — PANTOPRAZOLE SODIUM 40 MG PO TBEC
40.0000 mg | DELAYED_RELEASE_TABLET | Freq: Every day | ORAL | Status: DC
  Administered 2016-02-28 – 2016-03-02 (×3): 40 mg via ORAL
  Filled 2016-02-27 (×3): qty 1

## 2016-02-27 MED ORDER — OXYCODONE HCL 5 MG PO TABS
5.0000 mg | ORAL_TABLET | ORAL | Status: DC | PRN
  Administered 2016-02-28: 5 mg via ORAL
  Filled 2016-02-27: qty 1

## 2016-02-27 MED ORDER — DEXTROSE-NACL 5-0.45 % IV SOLN
INTRAVENOUS | Status: DC
Start: 2016-02-27 — End: 2016-02-28

## 2016-02-27 MED ORDER — CETYLPYRIDINIUM CHLORIDE 0.05 % MT LIQD
7.0000 mL | Freq: Two times a day (BID) | OROMUCOSAL | Status: DC
  Administered 2016-02-28 – 2016-02-29 (×3): 7 mL via OROMUCOSAL

## 2016-02-27 MED ORDER — DOCUSATE SODIUM 100 MG PO CAPS
100.0000 mg | ORAL_CAPSULE | Freq: Two times a day (BID) | ORAL | Status: DC
  Administered 2016-02-27 – 2016-02-29 (×5): 100 mg via ORAL
  Filled 2016-02-27 (×6): qty 1

## 2016-02-27 MED ORDER — MAGNESIUM CITRATE PO SOLN
1.0000 | Freq: Once | ORAL | Status: DC | PRN
Start: 2016-02-27 — End: 2016-03-02

## 2016-02-27 MED ORDER — TRAZODONE HCL 50 MG PO TABS
50.0000 mg | ORAL_TABLET | Freq: Every evening | ORAL | Status: DC | PRN
Start: 2016-02-27 — End: 2016-03-02

## 2016-02-27 MED ORDER — ASPIRIN EC 81 MG PO TBEC: 81.0000 mg | DELAYED_RELEASE_TABLET | Freq: Every day | ORAL | Status: DC

## 2016-02-27 MED ORDER — SODIUM CHLORIDE 0.9 % IV SOLN
INTRAVENOUS | Status: DC
  Administered 2016-02-28: 0.9 [IU]/h via INTRAVENOUS

## 2016-02-27 MED ORDER — SODIUM CHLORIDE 0.9 % IV BOLUS (SEPSIS)
1000.0000 mL | Freq: Once | INTRAVENOUS | Status: AC
  Administered 2016-02-27: 1000 mL via INTRAVENOUS

## 2016-02-27 MED ORDER — SODIUM CHLORIDE 0.9 % IV SOLN
INTRAVENOUS | Status: DC
  Administered 2016-02-28 – 2016-02-29 (×3): via INTRAVENOUS

## 2016-02-27 MED ORDER — INSULIN REGULAR BOLUS VIA INFUSION
0.0000 [IU] | Freq: Three times a day (TID) | INTRAVENOUS | Status: DC
  Filled 2016-02-27: qty 10

## 2016-02-27 MED ORDER — MORPHINE SULFATE (PF) 2 MG/ML IV SOLN: 2.0000 mg | INTRAVENOUS | Status: DC | PRN

## 2016-02-27 MED ORDER — SODIUM CHLORIDE 0.9 % IV SOLN
INTRAVENOUS | Status: DC
  Filled 2016-02-27: qty 2.5

## 2016-02-27 MED ORDER — CHLORHEXIDINE GLUCONATE 0.12 % MT SOLN
15.0000 mL | Freq: Two times a day (BID) | OROMUCOSAL | Status: DC
  Administered 2016-02-28 – 2016-03-01 (×6): 15 mL via OROMUCOSAL
  Filled 2016-02-27 (×4): qty 15

## 2016-02-27 MED ORDER — SODIUM CHLORIDE 0.9 % IV SOLN: INTRAVENOUS | Status: AC

## 2016-02-27 MED ORDER — DEXTROSE 50 % IV SOLN: 25.0000 mL | INTRAVENOUS | Status: DC | PRN

## 2016-02-27 MED ORDER — ONDANSETRON HCL 4 MG/2ML IJ SOLN: 4.0000 mg | Freq: Four times a day (QID) | INTRAMUSCULAR | Status: DC | PRN

## 2016-02-27 MED ORDER — ONDANSETRON HCL 4 MG PO TABS: 4.0000 mg | ORAL_TABLET | Freq: Four times a day (QID) | ORAL | Status: DC | PRN

## 2016-02-27 MED ORDER — SORBITOL 70 % SOLN: 30.0000 mL | Freq: Every day | Status: DC | PRN

## 2016-02-27 NOTE — ED Triage Notes (Signed)
Patient states over the past 3 months he has lost over 30lbs, having loss of appetite and fatigue. Patient state that he has scheduled scans for Friday that GP has ordered and patient states that he cant wait until then. Having increased fatigue to make him no go to work 3 days this past week.

## 2016-02-27 NOTE — ED Provider Notes (Signed)
[facility] DEPT Provider Note   CSN: [id number] Arrival date & time: 02/27/16  1452  First Provider Contact:  First MD Initiated Contact with Patient 02/27/16 1624        History   Chief Complaint Chief Complaint  Patient presents with  . Weight Loss  . Anorexia  . Fatigue    HPI Colton Bush is a 60 y.o. male.  The history is provided by the patient. No language interpreter was used.   Colton Bush is a 60 y.o. male who presents to the Emergency Department complaining of fatigue, weight loss.  He presents for evaluation of progressive fatigue and weight loss. He reports 3 months of decreased appetite and progressive weight loss, losing 30 pounds over the last 3 months. He has progressive dyspnea on exertion, nausea. No fevers. His wife states that his color has been off for the last 4-5 days. Symptoms started after starting on a new medication for diabetes 3 months ago. He saw his PCP 3 days ago and is scheduled for CT scanned next week. 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 Symptoms are moderate, progressive, worsening.  Past Medical History:  Diagnosis Date  . Arthritis    left hand  . Bronchitis 1977  . Depression    takes Cymbalta daily  . Diabetes mellitus type II    sees [name]   . GERD (gastroesophageal reflux disease)    takes Omeprazole daily  . H/O hiatal hernia   . Hyperlipidemia    takes Zocor daily  . Hypertension    takes Amlodipine daily  . Neck pain    C4-7 stenosis and herniated disc  . Neuromuscular disorder ([place])    hiatal hernia  . Scoliosis    slight  . Spinal cord injury, C5-C7 ([place])    c4-c7  . Stiffness of hand joint    d/t cervical issues    Patient Active Problem List   Diagnosis Date Noted  . Obstructive jaundice due to malignant neoplasm 02/27/2016  . DKA (diabetic ketoacidoses) ([place]) 02/27/2016  . Diabetes mellitus type 2, controlled ([place]) 05/05/2014  .  Cervical spondylosis with myelopathy 08/18/2011  . CERUMEN IMPACTION 12/14/2008  . Diabetes ([place]) 09/16/2007  . Hyperlipidemia, mixed 09/16/2007  . Essential hypertension 09/16/2007  . GERD 09/16/2007  . ESOPHAGEAL STRICTURE 04/04/2007  . HIATAL HERNIA 04/04/2007    Past Surgical History:  Procedure Laterality Date  . ANTERIOR CERVICAL DECOMP/DISCECTOMY FUSION  08/18/2011   Procedure: ANTERIOR CERVICAL DECOMPRESSION/DISCECTOMY FUSION 3 LEVELS;  Surgeon: Winfield Cunas, MD;  Location: [facility];  Service: Neurosurgery;  Laterality: N/A;  Anterior Cervical Four-Five/Five-Six/Six-Seven Decompression with Fusion, Plating, and Bonegraft  . CARPAL TUNNEL RELEASE  2013   bilateral, per Dr. Christella Noa   . COLONOSCOPY  10-30-14   per Dr. Olevia Perches, clear, repeat in 10 yrs   . egd with esophageal dilation  9-08   per Dr. Olevia Perches  . lymph nodes biopsy    . melanoma rt calf  1999  . SPINE SURGERY    . TONSILLECTOMY     as a child  . ULNAR TUNNEL RELEASE  2013   right arm, per Dr. Christella Noa   . UPPER GASTROINTESTINAL ENDOSCOPY         Home Medications    Prior to Admission medications   Medication Sig Start Date End Date Taking? Authorizing Provider  amLODipine-benazepril (LOTREL) 10-20 MG capsule Take  1 capsule by mouth daily.   Yes Historical Provider, MD  aspirin EC 81 MG tablet Take 81 mg by mouth daily.   Yes Historical Provider, MD  Dulaglutide (TRULICITY) [id number] SOPN Inject 0.75 mg into the skin once a week. Pt uses on Sunday.   Yes Historical Provider, MD  empagliflozin (JARDIANCE) 25 MG TABS tablet Take 25 mg by mouth daily.   Yes Historical Provider, MD  glipiZIDE (GLUCOTROL XL) 10 MG 24 hr tablet Take 10 mg by mouth daily with breakfast.   Yes Historical Provider, MD  metFORMIN (GLUCOPHAGE-XR) 750 MG 24 hr tablet Take 1,500 mg by mouth daily with breakfast.   Yes Historical Provider, MD  Multiple Vitamin (MULTIVITAMIN WITH MINERALS) TABS tablet Take 1 tablet by mouth daily.    Yes Historical Provider, MD  omega-3 acid ethyl esters (LOVAZA) 1 g capsule Take 2 g by mouth daily.   Yes Historical Provider, MD  omeprazole (PRILOSEC) 20 MG capsule Take 20 mg by mouth daily.   Yes Historical Provider, MD  pravastatin (PRAVACHOL) 40 MG tablet Take 40 mg by mouth at bedtime.   Yes Historical Provider, MD    Family History Family History  Problem Relation Age of Onset  . Heart disease Father   . Heart disease Brother 14  . Anesthesia problems Mother   . Heart disease Mother   . Dementia Mother   . Diabetes Sister   . Stroke Sister   . Colon cancer Neg Hx   . Rectal cancer Neg Hx   . Stomach cancer Neg Hx     Social History Social History  Substance Use Topics  . Smoking status: Never Smoker  . Smokeless tobacco: Never Used     Comment: tried a pipe 35 years ago   . Alcohol use No     Allergies   Review of patient's allergies indicates no known allergies.   Review of Systems Review of Systems  All other systems reviewed and are negative.    Physical Exam Updated Vital Signs BP 126/70 (BP Location: Right Arm)   Pulse 87   Temp 97.9 F (36.6 C) (Oral)   Resp 16   Ht 5\' 10"  (1.778 m)   Wt 175 lb 14.8 oz (79.8 kg)   SpO2 100%   BMI 25.24 kg/m   Physical Exam  Constitutional: He is oriented to person, place, and time. He appears well-developed.  Frail  HENT:  Head: Normocephalic and atraumatic.  Eyes: Scleral icterus is present.  Cardiovascular: Regular rhythm.   No murmur heard. Tachycardic  Pulmonary/Chest: Effort normal and breath sounds normal. No respiratory distress.  Abdominal: Soft. There is no tenderness. There is no rebound and no guarding.  Musculoskeletal: He exhibits no edema or tenderness.  Neurological: He is alert and oriented to person, place, and time.  Skin: Skin is warm and dry.  Psychiatric: He has a normal mood and affect. His behavior is normal.  Nursing note and vitals reviewed.    ED Treatments / Results    Labs (all labs ordered are listed, but only abnormal results are displayed) Labs Reviewed  CBC - Abnormal; Notable for the following:       Result Value   WBC 12.0 (*)    RBC 3.76 (*)    Hemoglobin 12.7 (*)    MCV 105.6 (*)    Platelets 449 (*)    All other components within normal limits  COMPREHENSIVE METABOLIC PANEL - Abnormal; Notable for the following:  Sodium 131 (*)    Potassium 5.2 (*)    Chloride 99 (*)    CO2 12 (*)    Glucose, Bld 256 (*)    BUN 38 (*)    Albumin 3.4 (*)    AST 159 (*)    ALT 336 (*)    Alkaline Phosphatase 824 (*)    Total Bilirubin 11.2 (*)    Anion gap 20 (*)    All other components within normal limits  LIPASE, BLOOD - Abnormal; Notable for the following:    Lipase 107 (*)    All other components within normal limits  CBC - Abnormal; Notable for the following:    WBC 11.1 (*)    RBC 3.56 (*)    Hemoglobin 12.0 (*)    HCT 37.3 (*)    MCV 104.8 (*)    Platelets 401 (*)    All other components within normal limits  BASIC METABOLIC PANEL - Abnormal; Notable for the following:    Sodium 132 (*)    CO2 13 (*)    Glucose, Bld 147 (*)    BUN 37 (*)    Anion gap 17 (*)    All other components within normal limits  GLUCOSE, CAPILLARY - Abnormal; Notable for the following:    Glucose-Capillary 153 (*)    All other components within normal limits  GLUCOSE, CAPILLARY - Abnormal; Notable for the following:    Glucose-Capillary 132 (*)    All other components within normal limits  URINE CULTURE  PROTIME-INR  MAGNESIUM  PROTIME-INR  URINALYSIS, ROUTINE W REFLEX MICROSCOPIC (NOT AT [facility])  BASIC METABOLIC PANEL  HEPATIC FUNCTION PANEL  CBC  PROTIME-INR  BASIC METABOLIC PANEL  BASIC METABOLIC PANEL  BASIC METABOLIC PANEL  BASIC METABOLIC PANEL  BASIC METABOLIC PANEL  CBG MONITORING, ED  I-STAT TROPOININ, ED    EKG  EKG Interpretation  Date/Time:  Sunday February 27 2016 16:59:37 EDT Ventricular Rate:  81 PR Interval:    QRS  Duration: 90 QT Interval:  389 QTC Calculation: 452 R Axis:   69 Text Interpretation:  Sinus rhythm Probable left atrial enlargement Nonspecific T abnormalities, lateral leads Confirmed by Hazle Coca [phone]) on 02/27/2016 5:02:38 PM       Radiology Ct Chest W Contrast  Result Date: 02/27/2016 CLINICAL DATA:  Weight loss EXAM: CT CHEST, ABDOMEN, AND PELVIS WITH CONTRAST TECHNIQUE: Multidetector CT imaging of the chest, abdomen and pelvis was performed following the standard protocol during bolus administration of intravenous contrast. CONTRAST:  16mL ISOVUE-300 IOPAMIDOL (ISOVUE-300) INJECTION 61% COMPARISON:  None. FINDINGS: CT CHEST FINDINGS Mediastinum/Lymph Nodes: Heart size is normal. No pericardial effusion. Thoracic aorta is normal in caliber and configuration. No mass or enlarged lymph nodes seen within the mediastinum or perihilar regions. Trachea and central bronchi are unremarkable. Lungs/Pleura: Lungs are clear. No pulmonary nodule or mass. No consolidation. No pleural effusion or pneumothorax. Musculoskeletal: Dextroscoliosis of the thoracic spine, measuring approximately 25 degrees. Mild degenerative change within the thoracic spine. No acute or suspicious osseous finding. Superficial soft tissues are unremarkable. CT ABDOMEN PELVIS FINDINGS Hepatobiliary: Diffuse intrahepatic bile duct dilatation. Common bile duct distended to approximately 1.5 cm. No focal enhancing mass or lesion identified within the liver. Gallbladder is moderately distended but otherwise unremarkable. Pancreas: Pancreatic duct dilatation, dilated up to approximately 1.2 cm. Heterogeneity of the pancreatic head, ill-defined, almost certainly representing a pancreatic carcinoma. Spleen: Within normal limits in size and appearance. Adrenals/Urinary Tract: Adrenal glands appear normal. Kidneys appear normal without  stone or hydronephrosis. Stomach/Bowel: Bowel is normal in caliber. No bowel wall thickening or evidence of  bowel wall inflammation. Appendix is normal. Vascular/Lymphatic: Mildly prominent lymph nodes within the upper abdomen, including a 2 cm short axis lymph node in the portacaval space. No enlarged lymph nodes elsewhere in the abdomen or pelvis. Mild atherosclerotic changes of the normal caliber abdominal aorta. Reproductive: No mass or other significant abnormality. Other: No free fluid or abscess collection identified. No free intraperitoneal air. Musculoskeletal: Degenerative changes throughout the thoracolumbar spine, moderate in degree. No acute or suspicious osseous finding. Sclerotic lesion within the left iliac bone is most likely a benign bone island. Superficial soft tissues are unremarkable. IMPRESSION: 1. Heterogeneity of the pancreatic head, ill-defined limiting measurement, almost certainly representing a pancreatic carcinoma given the combination of bile duct dilatation and pancreatic duct dilatation. Recommend pancreas MRI for further characterization. 2. Mildly prominent lymph nodes within the upper abdomen, suspicious for local metastatic lymphadenopathy, particularly prominent lymph node in the portacaval space measuring 2 cm short axis dimension. No enlarged lymph nodes elsewhere in the abdomen or pelvis. No evidence of distant metastasis. 3. Normal chest CT. 4. Scoliosis. No evidence of osseous metastasis. Probable bone island within the left iliac bone. These results were called by telephone at the time of interpretation on 02/27/2016 at 7:15 pm to Dr. Quintella Reichert , who verbally acknowledged these results. Electronically Signed   By: Franki Cabot M.D.   On: 02/27/2016 19:23  Ct Abdomen Pelvis W Contrast  Result Date: 02/27/2016 CLINICAL DATA:  Weight loss EXAM: CT CHEST, ABDOMEN, AND PELVIS WITH CONTRAST TECHNIQUE: Multidetector CT imaging of the chest, abdomen and pelvis was performed following the standard protocol during bolus administration of intravenous contrast. CONTRAST:  149mL  ISOVUE-300 IOPAMIDOL (ISOVUE-300) INJECTION 61% COMPARISON:  None. FINDINGS: CT CHEST FINDINGS Mediastinum/Lymph Nodes: Heart size is normal. No pericardial effusion. Thoracic aorta is normal in caliber and configuration. No mass or enlarged lymph nodes seen within the mediastinum or perihilar regions. Trachea and central bronchi are unremarkable. Lungs/Pleura: Lungs are clear. No pulmonary nodule or mass. No consolidation. No pleural effusion or pneumothorax. Musculoskeletal: Dextroscoliosis of the thoracic spine, measuring approximately 25 degrees. Mild degenerative change within the thoracic spine. No acute or suspicious osseous finding. Superficial soft tissues are unremarkable. CT ABDOMEN PELVIS FINDINGS Hepatobiliary: Diffuse intrahepatic bile duct dilatation. Common bile duct distended to approximately 1.5 cm. No focal enhancing mass or lesion identified within the liver. Gallbladder is moderately distended but otherwise unremarkable. Pancreas: Pancreatic duct dilatation, dilated up to approximately 1.2 cm. Heterogeneity of the pancreatic head, ill-defined, almost certainly representing a pancreatic carcinoma. Spleen: Within normal limits in size and appearance. Adrenals/Urinary Tract: Adrenal glands appear normal. Kidneys appear normal without stone or hydronephrosis. Stomach/Bowel: Bowel is normal in caliber. No bowel wall thickening or evidence of bowel wall inflammation. Appendix is normal. Vascular/Lymphatic: Mildly prominent lymph nodes within the upper abdomen, including a 2 cm short axis lymph node in the portacaval space. No enlarged lymph nodes elsewhere in the abdomen or pelvis. Mild atherosclerotic changes of the normal caliber abdominal aorta. Reproductive: No mass or other significant abnormality. Other: No free fluid or abscess collection identified. No free intraperitoneal air. Musculoskeletal: Degenerative changes throughout the thoracolumbar spine, moderate in degree. No acute or suspicious  osseous finding. Sclerotic lesion within the left iliac bone is most likely a benign bone island. Superficial soft tissues are unremarkable. IMPRESSION: 1. Heterogeneity of the pancreatic head, ill-defined limiting measurement, almost certainly representing a pancreatic  carcinoma given the combination of bile duct dilatation and pancreatic duct dilatation. Recommend pancreas MRI for further characterization. 2. Mildly prominent lymph nodes within the upper abdomen, suspicious for local metastatic lymphadenopathy, particularly prominent lymph node in the portacaval space measuring 2 cm short axis dimension. No enlarged lymph nodes elsewhere in the abdomen or pelvis. No evidence of distant metastasis. 3. Normal chest CT. 4. Scoliosis. No evidence of osseous metastasis. Probable bone island within the left iliac bone. These results were called by telephone at the time of interpretation on 02/27/2016 at 7:15 pm to Dr. Quintella Reichert , who verbally acknowledged these results. Electronically Signed   By: Franki Cabot M.D.   On: 02/27/2016 19:23   Procedures Procedures (including critical care time)  Medications Ordered in ED Medications  aspirin EC tablet 81 mg (not administered)  multivitamin with minerals tablet 1 tablet (not administered)  pantoprazole (PROTONIX) EC tablet 40 mg (not administered)  oxyCODONE (Oxy IR/ROXICODONE) immediate release tablet 5 mg (not administered)  morphine 2 MG/ML injection 2 mg (not administered)  traZODone (DESYREL) tablet 50 mg (not administered)  docusate sodium (COLACE) capsule 100 mg (100 mg Oral Given 02/27/16 2304)  polyethylene glycol (MIRALAX / GLYCOLAX) packet 17 g (not administered)  sorbitol 70 % solution 30 mL (not administered)  magnesium citrate solution 1 Bottle (not administered)  ondansetron (ZOFRAN) tablet 4 mg (not administered)    Or  ondansetron (ZOFRAN) injection 4 mg (not administered)  0.9 %  sodium chloride infusion ( Intravenous Not Given  02/27/16 2134)  0.9 %  sodium chloride infusion ( Intravenous Not Given 02/27/16 2153)  dextrose 5 %-0.45 % sodium chloride infusion ( Intravenous Not Given 02/27/16 2045)  enoxaparin (LOVENOX) injection 40 mg (40 mg Subcutaneous Given 02/27/16 2305)  dextrose 5 %-0.45 % sodium chloride infusion ( Intravenous New Bag/Given 02/27/16 2229)  insulin regular bolus via infusion 0-10 Units (not administered)  insulin regular (NOVOLIN R,HUMULIN R) 250 Units in sodium chloride 0.9 % 250 mL (1 Units/mL) infusion ( Intravenous Not Given 02/27/16 2153)  dextrose 50 % solution 25 mL (not administered)  0.9 %  sodium chloride infusion ( Intravenous Not Given 02/27/16 2152)  chlorhexidine (PERIDEX) 0.12 % solution 15 mL (not administered)  antiseptic oral rinse (CPC / CETYLPYRIDINIUM CHLORIDE 0.05%) solution 7 mL (not administered)  sodium chloride 0.9 % bolus 1,000 mL (0 mLs Intravenous Stopped 02/27/16 1805)  iopamidol (ISOVUE-300) 61 % injection 100 mL (100 mLs Intravenous Contrast Given 02/27/16 1846)     Initial Impression / Assessment and Plan / ED Course  I have reviewed the triage vital signs and the nursing notes.  Pertinent labs & imaging results that were available during my care of the patient were reviewed by me and considered in my medical decision making (see chart for details).  Clinical Course    Patient here for evaluation of progressive fatigue and weight loss, he is cachectic with scleral icterus on examination. Labs are concerning for biliary obstruction as well as CT scan. Discussed with the [facility], will see the patient in consult. Discussed with the hospitalist service for admission for further treatment. Patient updated of findings to studies and need for admission for further treatment.  Final Clinical Impressions(s) / ED Diagnoses   Final diagnoses:  None    New Prescriptions Current Discharge Medication List       Quintella Reichert, MD 02/27/16 2350

## 2016-02-27 NOTE — ED Notes (Signed)
Patient to weak for blood draw at this time.

## 2016-02-27 NOTE — H&P (Signed)
History and Physical    JABIER CULHANE [id number] DOB: 1956-02-05 DOA: 02/27/2016  Referring MD/NP/PA:  PCP: [name], MD Outpatient Specialists: N/A  Chief Complaint: JAUNDICE  HPI: EWARD HELOU is a 60 y.o. male with medical history including but not limited diabetes mellitus, GERD and esophageal stricture.  He presented to the emergency room with 3 month history of progressively worsening weight loss, generalized fatigue, decreased appetite, nausea and abnormal skin discoloration as well as mild dyspnea on exertion over the last 5 days without fever or chills. He had been started on a new medication for diabetes about 3 months earlier and wonders if his symptoms may be related to medication.  No history of cough or sputum production, no increasing abdominal girth, he is unable to tell if he stools have been pale, denies any blood transfusion in the last few months or year, denies any known previous liver disease ED Course: Labs obtained at emergency room indicated mild microcytic anemia, mild hyponatremia of 132, mild hyperkalemia of 5.2, glucose 256, initiated on end Of 20 CO2 of 13 ,BUN 38, significantly elevated liver function tests with AST of 159, ALT of 336 associated with R Collins phosphatase of 824 and bilirubin of 11.2. Twelve-lead EKG was negative for ischemic changes . CT of chest, abdomen and pelvis was done which revealed diffuse intrahepatic and pancreatic duct dilatation with common bile duct and gallbladder distention as well as pancreatic head changes on standing for carcinoma, possibly metastatic due to associated intra-abdominal lymphadenopathy.  IV fluids and other supportive measures were initiated in the ER and hospitalist consulted for admission.  Review of Systems: As per HPI otherwise 10 point review of systems negative.   Past Medical History:  Diagnosis Date  . Arthritis    left hand  . Bronchitis 1977  . Depression    takes Cymbalta daily  .  Diabetes mellitus type II    sees [name]   . GERD (gastroesophageal reflux disease)    takes Omeprazole daily  . H/O hiatal hernia   . Hyperlipidemia    takes Zocor daily  . Hypertension    takes Amlodipine daily  . Neck pain    C4-7 stenosis and herniated disc  . Neuromuscular disorder ([place])    hiatal hernia  . Scoliosis    slight  . Spinal cord injury, C5-C7 ([place])    c4-c7  . Stiffness of hand joint    d/t cervical issues    Past Surgical History:  Procedure Laterality Date  . ANTERIOR CERVICAL DECOMP/DISCECTOMY FUSION  08/18/2011   Procedure: ANTERIOR CERVICAL DECOMPRESSION/DISCECTOMY FUSION 3 LEVELS;  Surgeon: Winfield Cunas, MD;  Location: [facility];  Service: Neurosurgery;  Laterality: N/A;  Anterior Cervical Four-Five/Five-Six/Six-Seven Decompression with Fusion, Plating, and Bonegraft  . CARPAL TUNNEL RELEASE  2013   bilateral, per Dr. Christella Noa   . COLONOSCOPY  10-30-14   per Dr. Olevia Perches, clear, repeat in 10 yrs   . egd with esophageal dilation  9-08   per Dr. Olevia Perches  . lymph nodes biopsy    . melanoma rt calf  1999  . SPINE SURGERY    . TONSILLECTOMY     as a child  . ULNAR TUNNEL RELEASE  2013   right arm, per Dr. Christella Noa   . UPPER GASTROINTESTINAL ENDOSCOPY       reports that he has never smoked. He has never used smokeless tobacco. He reports that he does not drink alcohol or use drugs.  No Known  Allergies  Family History  Problem Relation Age of Onset  . Heart disease Father   . Heart disease Brother 53  . Anesthesia problems Mother   . Heart disease Mother   . Dementia Mother   . Diabetes Sister   . Stroke Sister   . Colon cancer Neg Hx   . Rectal cancer Neg Hx   . Stomach cancer Neg Hx      Prior to Admission medications   Medication Sig Start Date End Date Taking? Authorizing Provider  amLODipine-benazepril (LOTREL) 10-20 MG capsule Take 1 capsule by mouth daily.   Yes Historical Provider, MD  aspirin EC 81 MG tablet Take 81 mg by mouth  daily.   Yes Historical Provider, MD  Dulaglutide (TRULICITY) [id number] SOPN Inject 0.75 mg into the skin once a week. Pt uses on Sunday.   Yes Historical Provider, MD  empagliflozin (JARDIANCE) 25 MG TABS tablet Take 25 mg by mouth daily.   Yes Historical Provider, MD  glipiZIDE (GLUCOTROL XL) 10 MG 24 hr tablet Take 10 mg by mouth daily with breakfast.   Yes Historical Provider, MD  metFORMIN (GLUCOPHAGE-XR) 750 MG 24 hr tablet Take 1,500 mg by mouth daily with breakfast.   Yes Historical Provider, MD  Multiple Vitamin (MULTIVITAMIN WITH MINERALS) TABS tablet Take 1 tablet by mouth daily.   Yes Historical Provider, MD  omega-3 acid ethyl esters (LOVAZA) 1 g capsule Take 2 g by mouth daily.   Yes Historical Provider, MD  omeprazole (PRILOSEC) 20 MG capsule Take 20 mg by mouth daily.   Yes Historical Provider, MD  pravastatin (PRAVACHOL) 40 MG tablet Take 40 mg by mouth at bedtime.   Yes Historical Provider, MD    Physical Exam: Vitals:   02/27/16 1800 02/27/16 1830 02/27/16 1900 02/27/16 2000  BP: 100/58 92/59 97/56  116/81  Pulse: 78 80 80 86  Resp: 14 17 16 19   Temp:      TempSrc:      SpO2: 100% 100% 100% 100%  Weight:      Height:          Constitutional: NAD, calm, comfortable Vitals:   02/27/16 1800 02/27/16 1830 02/27/16 1900 02/27/16 2000  BP: 100/58 92/59 97/56  116/81  Pulse: 78 80 80 86  Resp: 14 17 16 19   Temp:      TempSrc:      SpO2: 100% 100% 100% 100%  Weight:      Height:       Eyes: PERRL, lids and conjunctivae normal ENMT: Mucous membranes are moist. Posterior pharynx clear of any exudate or lesions.Normal dentition.  Neck: normal, supple, no masses, no thyromegaly Respiratory: clear to auscultation bilaterally, no wheezing, no crackles. Normal respiratory effort. No accessory muscle use.  Cardiovascular: Regular rate and rhythm, no murmurs / rubs / gallops. No extremity edema. 2+ pedal pulses. No carotid bruits.  Abdomen: no tenderness, no masses  palpated. No hepatosplenomegaly. Bowel sounds positive.  Musculoskeletal: no clubbing / cyanosis. No joint deformity upper and lower extremities. Good ROM, no contractures. Normal muscle tone.  Skin: no rashes, lesions, ulcers. No induration Neurologic: CN 2-12 grossly intact. Sensation intact, DTR normal. Strength 5/5 in all 4.  Psychiatric: Normal judgment and insight. Alert and oriented x 3. Normal mood.     Labs on Admission: I have personally reviewed following labs and imaging studies  CBC:  Recent Labs Lab 02/23/16 1118 02/27/16 1647  WBC 10.3 12.0*  NEUTROABS 8.0*  --   HGB 12.7* 12.7*  HCT 38.4* 39.7  MCV 100.8* 105.6*  PLT 402.0* 123XX123*   Basic Metabolic Panel:  Recent Labs Lab 02/23/16 1118 02/27/16 1647  NA 131* 131*  K 5.8* 5.2*  CL 94* 99*  CO2 16* 12*  GLUCOSE 293* 256*  BUN 24* 38*  CREATININE 0.89 0.95  CALCIUM 10.2 9.7   GFR: Estimated Creatinine Clearance: 85.9 mL/min (by C-G formula based on SCr of 0.95 mg/dL). Liver Function Tests:  Recent Labs Lab 02/23/16 1118 02/27/16 1647  AST 196* 159*  ALT 242* 336*  ALKPHOS 740* 824*  BILITOT 7.6* 11.2*  PROT 7.0 7.5  ALBUMIN 3.8 3.4*    Recent Labs Lab 02/23/16 1118 02/27/16 1647  LIPASE 216.0* 107*  AMYLASE 56  --    No results for input(s): AMMONIA in the last 168 hours. Coagulation Profile:  Recent Labs Lab 02/27/16 1647  INR 1.00   Cardiac Enzymes: No results for input(s): CKTOTAL, CKMB, CKMBINDEX, TROPONINI in the last 168 hours. BNP (last 3 results) No results for input(s): PROBNP in the last 8760 hours. HbA1C: No results for input(s): HGBA1C in the last 72 hours. CBG: No results for input(s): GLUCAP in the last 168 hours. Lipid Profile: No results for input(s): CHOL, HDL, LDLCALC, TRIG, CHOLHDL, LDLDIRECT in the last 72 hours. Thyroid Function Tests: No results for input(s): TSH, T4TOTAL, FREET4, T3FREE, THYROIDAB in the last 72 hours. Anemia Panel: No results for  input(s): VITAMINB12, FOLATE, FERRITIN, TIBC, IRON, RETICCTPCT in the last 72 hours. Urine analysis:    Component Value Date/Time   BILIRUBINUR 1+ 02/23/2016 1256   PROTEINUR n 02/23/2016 1256   UROBILINOGEN 1.0 02/23/2016 1256   NITRITE n 02/23/2016 1256   LEUKOCYTESUR Negative 02/23/2016 1256   Sepsis Labs: @LABRCNTIP (procalcitonin:4,lacticidven:4) )No results found for this or any previous visit (from the past 240 hour(s)).   Radiological Exams on Admission: Ct Chest W Contrast  Result Date: 02/27/2016 CLINICAL DATA:  Weight loss EXAM: CT CHEST, ABDOMEN, AND PELVIS WITH CONTRAST TECHNIQUE: Multidetector CT imaging of the chest, abdomen and pelvis was performed following the standard protocol during bolus administration of intravenous contrast. CONTRAST:  125mL ISOVUE-300 IOPAMIDOL (ISOVUE-300) INJECTION 61% COMPARISON:  None. FINDINGS: CT CHEST FINDINGS Mediastinum/Lymph Nodes: Heart size is normal. No pericardial effusion. Thoracic aorta is normal in caliber and configuration. No mass or enlarged lymph nodes seen within the mediastinum or perihilar regions. Trachea and central bronchi are unremarkable. Lungs/Pleura: Lungs are clear. No pulmonary nodule or mass. No consolidation. No pleural effusion or pneumothorax. Musculoskeletal: Dextroscoliosis of the thoracic spine, measuring approximately 25 degrees. Mild degenerative change within the thoracic spine. No acute or suspicious osseous finding. Superficial soft tissues are unremarkable. CT ABDOMEN PELVIS FINDINGS Hepatobiliary: Diffuse intrahepatic bile duct dilatation. Common bile duct distended to approximately 1.5 cm. No focal enhancing mass or lesion identified within the liver. Gallbladder is moderately distended but otherwise unremarkable. Pancreas: Pancreatic duct dilatation, dilated up to approximately 1.2 cm. Heterogeneity of the pancreatic head, ill-defined, almost certainly representing a pancreatic carcinoma. Spleen: Within normal  limits in size and appearance. Adrenals/Urinary Tract: Adrenal glands appear normal. Kidneys appear normal without stone or hydronephrosis. Stomach/Bowel: Bowel is normal in caliber. No bowel wall thickening or evidence of bowel wall inflammation. Appendix is normal. Vascular/Lymphatic: Mildly prominent lymph nodes within the upper abdomen, including a 2 cm short axis lymph node in the portacaval space. No enlarged lymph nodes elsewhere in the abdomen or pelvis. Mild atherosclerotic changes of the normal caliber abdominal aorta. Reproductive: No mass or other  significant abnormality. Other: No free fluid or abscess collection identified. No free intraperitoneal air. Musculoskeletal: Degenerative changes throughout the thoracolumbar spine, moderate in degree. No acute or suspicious osseous finding. Sclerotic lesion within the left iliac bone is most likely a benign bone island. Superficial soft tissues are unremarkable. IMPRESSION: 1. Heterogeneity of the pancreatic head, ill-defined limiting measurement, almost certainly representing a pancreatic carcinoma given the combination of bile duct dilatation and pancreatic duct dilatation. Recommend pancreas MRI for further characterization. 2. Mildly prominent lymph nodes within the upper abdomen, suspicious for local metastatic lymphadenopathy, particularly prominent lymph node in the portacaval space measuring 2 cm short axis dimension. No enlarged lymph nodes elsewhere in the abdomen or pelvis. No evidence of distant metastasis. 3. Normal chest CT. 4. Scoliosis. No evidence of osseous metastasis. Probable bone island within the left iliac bone. These results were called by telephone at the time of interpretation on 02/27/2016 at 7:15 pm to Dr. Quintella Reichert , who verbally acknowledged these results. Electronically Signed   By: Franki Cabot M.D.   On: 02/27/2016 19:23  Ct Abdomen Pelvis W Contrast  Result Date: 02/27/2016 CLINICAL DATA:  Weight loss EXAM: CT CHEST,  ABDOMEN, AND PELVIS WITH CONTRAST TECHNIQUE: Multidetector CT imaging of the chest, abdomen and pelvis was performed following the standard protocol during bolus administration of intravenous contrast. CONTRAST:  132mL ISOVUE-300 IOPAMIDOL (ISOVUE-300) INJECTION 61% COMPARISON:  None. FINDINGS: CT CHEST FINDINGS Mediastinum/Lymph Nodes: Heart size is normal. No pericardial effusion. Thoracic aorta is normal in caliber and configuration. No mass or enlarged lymph nodes seen within the mediastinum or perihilar regions. Trachea and central bronchi are unremarkable. Lungs/Pleura: Lungs are clear. No pulmonary nodule or mass. No consolidation. No pleural effusion or pneumothorax. Musculoskeletal: Dextroscoliosis of the thoracic spine, measuring approximately 25 degrees. Mild degenerative change within the thoracic spine. No acute or suspicious osseous finding. Superficial soft tissues are unremarkable. CT ABDOMEN PELVIS FINDINGS Hepatobiliary: Diffuse intrahepatic bile duct dilatation. Common bile duct distended to approximately 1.5 cm. No focal enhancing mass or lesion identified within the liver. Gallbladder is moderately distended but otherwise unremarkable. Pancreas: Pancreatic duct dilatation, dilated up to approximately 1.2 cm. Heterogeneity of the pancreatic head, ill-defined, almost certainly representing a pancreatic carcinoma. Spleen: Within normal limits in size and appearance. Adrenals/Urinary Tract: Adrenal glands appear normal. Kidneys appear normal without stone or hydronephrosis. Stomach/Bowel: Bowel is normal in caliber. No bowel wall thickening or evidence of bowel wall inflammation. Appendix is normal. Vascular/Lymphatic: Mildly prominent lymph nodes within the upper abdomen, including a 2 cm short axis lymph node in the portacaval space. No enlarged lymph nodes elsewhere in the abdomen or pelvis. Mild atherosclerotic changes of the normal caliber abdominal aorta. Reproductive: No mass or other  significant abnormality. Other: No free fluid or abscess collection identified. No free intraperitoneal air. Musculoskeletal: Degenerative changes throughout the thoracolumbar spine, moderate in degree. No acute or suspicious osseous finding. Sclerotic lesion within the left iliac bone is most likely a benign bone island. Superficial soft tissues are unremarkable. IMPRESSION: 1. Heterogeneity of the pancreatic head, ill-defined limiting measurement, almost certainly representing a pancreatic carcinoma given the combination of bile duct dilatation and pancreatic duct dilatation. Recommend pancreas MRI for further characterization. 2. Mildly prominent lymph nodes within the upper abdomen, suspicious for local metastatic lymphadenopathy, particularly prominent lymph node in the portacaval space measuring 2 cm short axis dimension. No enlarged lymph nodes elsewhere in the abdomen or pelvis. No evidence of distant metastasis. 3. Normal chest CT. 4. Scoliosis. No  evidence of osseous metastasis. Probable bone island within the left iliac bone. These results were called by telephone at the time of interpretation on 02/27/2016 at 7:15 pm to Dr. Quintella Reichert , who verbally acknowledged these results. Electronically Signed   By: Franki Cabot M.D.   On: 02/27/2016 19:23   EKG: Independently reviewed. No ischemic changes.  Assessment/Plan Principal Problem:   Obstructive jaundice due to malignant neoplasm Active Problems:   Diabetes ([place])   Hyperlipidemia, mixed   Essential hypertension   GERD   Diabetes mellitus type 2, controlled ([place])   DKA (diabetic ketoacidoses) ([place])    Plan of care is to continue supportive care, further workup of abnormal LFT with bilirubin fractionation and monitoring. Patient may need MRI/MRCP but would defer to GI consult.  Patient has hyperglycemia and anion gap with metabolic acidosis and is initiated on IV fluids with IV insulin for DKA with careful monitoring of electrolytes  and glucose levels. Protocol. Hold statin for now.    DVT prophylaxis: LOVENOX Code Status: Full Family Communication: N/A  Disposition Plan: To be determined Consults called: GI, DR PERRY Admission status: inpatient tele  OSEI-BONSU,Rosemary Mossbarger MD [facility] Pager [phone]  If 7PM-7AM, please contact night-coverage [email or web address] Password [facility]  02/27/2016, 8:34 PM

## 2016-02-27 NOTE — ED Triage Notes (Signed)
Phlebotomy out to lobby to get pt for labs  Pt stood up and became very unsteady on his feet Pt assisted back to chair RN notified

## 2016-02-27 NOTE — ED Notes (Signed)
Lab stated urinalysis could not be performed on urine sent to lab because there was not enough

## 2016-02-27 NOTE — ED Notes (Signed)
Ice chips provided for pt.

## 2016-02-27 NOTE — ED Notes (Signed)
MD at bedside. 

## 2016-02-27 NOTE — ED Notes (Signed)
Pt in CT.

## 2016-02-28 ENCOUNTER — Telehealth: Payer: Self-pay

## 2016-02-28 ENCOUNTER — Telehealth: Payer: Self-pay | Admitting: Family Medicine

## 2016-02-28 DIAGNOSIS — K838 Other specified diseases of biliary tract: Secondary | ICD-10-CM

## 2016-02-28 DIAGNOSIS — K8689 Other specified diseases of pancreas: Secondary | ICD-10-CM

## 2016-02-28 DIAGNOSIS — R634 Abnormal weight loss: Secondary | ICD-10-CM

## 2016-02-28 LAB — CBC
HCT: 34.4 % — ABNORMAL LOW (ref 39.0–52.0)
Hemoglobin: 11.1 g/dL — ABNORMAL LOW (ref 13.0–17.0)
MCH: 33.8 pg (ref 26.0–34.0)
MCHC: 32.3 g/dL (ref 30.0–36.0)
MCV: 104.9 fL — ABNORMAL HIGH (ref 78.0–100.0)
Platelets: 366 10*3/uL (ref 150–400)
RBC: 3.28 MIL/uL — ABNORMAL LOW (ref 4.22–5.81)
RDW: 14.7 % (ref 11.5–15.5)
WBC: 12.5 10*3/uL — ABNORMAL HIGH (ref 4.0–10.5)

## 2016-02-28 LAB — HEPATIC FUNCTION PANEL
ALT: 272 U/L — ABNORMAL HIGH (ref 17–63)
AST: 142 U/L — ABNORMAL HIGH (ref 15–41)
Albumin: 2.9 g/dL — ABNORMAL LOW (ref 3.5–5.0)
Alkaline Phosphatase: 683 U/L — ABNORMAL HIGH (ref 38–126)
Bilirubin, Direct: 6.6 mg/dL — ABNORMAL HIGH (ref 0.1–0.5)
Indirect Bilirubin: 4.4 mg/dL — ABNORMAL HIGH (ref 0.3–0.9)
Total Bilirubin: 11 mg/dL — ABNORMAL HIGH (ref 0.3–1.2)
Total Protein: 6.4 g/dL — ABNORMAL LOW (ref 6.5–8.1)

## 2016-02-28 LAB — URINALYSIS, ROUTINE W REFLEX MICROSCOPIC
Glucose, UA: >1000 mg/dL — AB
Hgb urine dipstick: NEGATIVE
Ketones, ur: >80 mg/dL — AB
Leukocytes, UA: NEGATIVE
Nitrite: NEGATIVE
Protein, ur: NEGATIVE mg/dL
Specific Gravity, Urine: >1.046 — ABNORMAL HIGH (ref 1.005–1.030)
pH: 5 (ref 5.0–8.0)

## 2016-02-28 LAB — BASIC METABOLIC PANEL
Anion gap: 17 — ABNORMAL HIGH (ref 5–15)
Anion gap: 14 (ref 5–15)
Anion gap: 14 (ref 5–15)
Anion gap: 16 — ABNORMAL HIGH (ref 5–15)
Anion gap: 17 — ABNORMAL HIGH (ref 5–15)
Anion gap: 10 (ref 5–15)
BUN: 34 mg/dL — ABNORMAL HIGH (ref 6–20)
BUN: 31 mg/dL — ABNORMAL HIGH (ref 6–20)
BUN: 31 mg/dL — ABNORMAL HIGH (ref 6–20)
BUN: 26 mg/dL — ABNORMAL HIGH (ref 6–20)
BUN: 24 mg/dL — ABNORMAL HIGH (ref 6–20)
BUN: 19 mg/dL (ref 6–20)
CO2: 12 mmol/L — ABNORMAL LOW (ref 22–32)
CO2: 14 mmol/L — ABNORMAL LOW (ref 22–32)
CO2: 15 mmol/L — ABNORMAL LOW (ref 22–32)
CO2: 14 mmol/L — ABNORMAL LOW (ref 22–32)
CO2: 13 mmol/L — ABNORMAL LOW (ref 22–32)
CO2: 17 mmol/L — ABNORMAL LOW (ref 22–32)
Calcium: 8.8 mg/dL — ABNORMAL LOW (ref 8.9–10.3)
Calcium: 8.9 mg/dL (ref 8.9–10.3)
Calcium: 8.9 mg/dL (ref 8.9–10.3)
Calcium: 9.1 mg/dL (ref 8.9–10.3)
Calcium: 8.7 mg/dL — ABNORMAL LOW (ref 8.9–10.3)
Calcium: 8.5 mg/dL — ABNORMAL LOW (ref 8.9–10.3)
Chloride: 105 mmol/L (ref 101–111)
Chloride: 104 mmol/L (ref 101–111)
Chloride: 105 mmol/L (ref 101–111)
Chloride: 102 mmol/L (ref 101–111)
Chloride: 103 mmol/L (ref 101–111)
Chloride: 102 mmol/L (ref 101–111)
Creatinine, Ser: 0.74 mg/dL (ref 0.61–1.24)
Creatinine, Ser: 0.76 mg/dL (ref 0.61–1.24)
Creatinine, Ser: 0.59 mg/dL — ABNORMAL LOW (ref 0.61–1.24)
Creatinine, Ser: 0.7 mg/dL (ref 0.61–1.24)
Creatinine, Ser: 0.85 mg/dL (ref 0.61–1.24)
Creatinine, Ser: 0.8 mg/dL (ref 0.61–1.24)
GFR calc Af Amer: >60 mL/min (ref >60)
GFR calc Af Amer: >60 mL/min (ref >60)
GFR calc Af Amer: >60 mL/min (ref >60)
GFR calc Af Amer: >60 mL/min (ref >60)
GFR calc Af Amer: >60 mL/min (ref >60)
GFR calc Af Amer: >60 mL/min (ref >60)
GFR calc non Af Amer: >60 mL/min (ref >60)
GFR calc non Af Amer: >60 mL/min (ref >60)
GFR calc non Af Amer: >60 mL/min (ref >60)
GFR calc non Af Amer: >60 mL/min (ref >60)
GFR calc non Af Amer: >60 mL/min (ref >60)
GFR calc non Af Amer: >60 mL/min (ref >60)
Glucose, Bld: 142 mg/dL — ABNORMAL HIGH (ref 65–99)
Glucose, Bld: 154 mg/dL — ABNORMAL HIGH (ref 65–99)
Glucose, Bld: 170 mg/dL — ABNORMAL HIGH (ref 65–99)
Glucose, Bld: 150 mg/dL — ABNORMAL HIGH (ref 65–99)
Glucose, Bld: 230 mg/dL — ABNORMAL HIGH (ref 65–99)
Glucose, Bld: 307 mg/dL — ABNORMAL HIGH (ref 65–99)
Potassium: 3.7 mmol/L (ref 3.5–5.1)
Potassium: 3.5 mmol/L (ref 3.5–5.1)
Potassium: 4 mmol/L (ref 3.5–5.1)
Potassium: 5.1 mmol/L (ref 3.5–5.1)
Potassium: 3.8 mmol/L (ref 3.5–5.1)
Potassium: 4 mmol/L (ref 3.5–5.1)
Sodium: 134 mmol/L — ABNORMAL LOW (ref 135–145)
Sodium: 132 mmol/L — ABNORMAL LOW (ref 135–145)
Sodium: 134 mmol/L — ABNORMAL LOW (ref 135–145)
Sodium: 132 mmol/L — ABNORMAL LOW (ref 135–145)
Sodium: 133 mmol/L — ABNORMAL LOW (ref 135–145)
Sodium: 129 mmol/L — ABNORMAL LOW (ref 135–145)

## 2016-02-28 LAB — URINE MICROSCOPIC-ADD ON

## 2016-02-28 LAB — PROTIME-INR
INR: 1.01
Prothrombin Time: 13.3 seconds (ref 11.4–15.2)

## 2016-02-28 LAB — GLUCOSE, CAPILLARY
Glucose-Capillary: 136 mg/dL — ABNORMAL HIGH (ref 65–99)
Glucose-Capillary: 145 mg/dL — ABNORMAL HIGH (ref 65–99)
Glucose-Capillary: 148 mg/dL — ABNORMAL HIGH (ref 65–99)
Glucose-Capillary: 149 mg/dL — ABNORMAL HIGH (ref 65–99)
Glucose-Capillary: 152 mg/dL — ABNORMAL HIGH (ref 65–99)
Glucose-Capillary: 152 mg/dL — ABNORMAL HIGH (ref 65–99)
Glucose-Capillary: 163 mg/dL — ABNORMAL HIGH (ref 65–99)
Glucose-Capillary: 164 mg/dL — ABNORMAL HIGH (ref 65–99)
Glucose-Capillary: 167 mg/dL — ABNORMAL HIGH (ref 65–99)
Glucose-Capillary: 172 mg/dL — ABNORMAL HIGH (ref 65–99)
Glucose-Capillary: 173 mg/dL — ABNORMAL HIGH (ref 65–99)
Glucose-Capillary: 257 mg/dL — ABNORMAL HIGH (ref 65–99)
Glucose-Capillary: 300 mg/dL — ABNORMAL HIGH (ref 65–99)

## 2016-02-28 LAB — LACTIC ACID, PLASMA: Lactic Acid, Venous: 0.7 mmol/L (ref 0.5–1.9)

## 2016-02-28 MED ORDER — ACETONE (URINE) TEST VI STRP: 1.0000 | ORAL_STRIP | Status: DC | PRN

## 2016-02-28 MED ORDER — INSULIN ASPART 100 UNIT/ML ~~LOC~~ SOLN
0.0000 [IU] | Freq: Every day | SUBCUTANEOUS | Status: DC
  Administered 2016-02-29 – 2016-03-01 (×2): 4 [IU] via SUBCUTANEOUS

## 2016-02-28 MED ORDER — INSULIN ASPART 100 UNIT/ML ~~LOC~~ SOLN: 0.0000 [IU] | Freq: Three times a day (TID) | SUBCUTANEOUS | Status: DC

## 2016-02-28 MED ORDER — INSULIN ASPART 100 UNIT/ML ~~LOC~~ SOLN
5.0000 [IU] | Freq: Once | SUBCUTANEOUS | Status: AC
  Administered 2016-02-28: 5 [IU] via SUBCUTANEOUS

## 2016-02-28 MED ORDER — INSULIN ASPART 100 UNIT/ML ~~LOC~~ SOLN
0.0000 [IU] | Freq: Three times a day (TID) | SUBCUTANEOUS | Status: DC
  Administered 2016-02-28: 5 [IU] via SUBCUTANEOUS
  Administered 2016-02-29: 3 [IU] via SUBCUTANEOUS
  Administered 2016-02-29: 5 [IU] via SUBCUTANEOUS
  Administered 2016-02-29: 1 [IU] via SUBCUTANEOUS
  Administered 2016-03-01: 3 [IU] via SUBCUTANEOUS
  Administered 2016-03-01: 9 [IU] via SUBCUTANEOUS
  Administered 2016-03-02: 3 [IU] via SUBCUTANEOUS
  Administered 2016-03-02: 5 [IU] via SUBCUTANEOUS

## 2016-02-28 MED ORDER — GLUCERNA SHAKE PO LIQD
237.0000 mL | Freq: Three times a day (TID) | ORAL | Status: DC
  Administered 2016-02-28 – 2016-03-02 (×7): 237 mL via ORAL
  Filled 2016-02-28 (×11): qty 237

## 2016-02-28 MED ORDER — INSULIN ASPART 100 UNIT/ML ~~LOC~~ SOLN: 0.0000 [IU] | SUBCUTANEOUS | Status: DC

## 2016-02-28 MED ORDER — SODIUM CHLORIDE 0.9 % IV SOLN
1.5000 g | Freq: Four times a day (QID) | INTRAVENOUS | Status: DC
  Administered 2016-02-28 – 2016-03-01 (×8): 1.5 g via INTRAVENOUS
  Filled 2016-02-28 (×10): qty 1.5

## 2016-02-28 MED ORDER — INSULIN ASPART 100 UNIT/ML ~~LOC~~ SOLN: 0.0000 [IU] | Freq: Every day | SUBCUTANEOUS | Status: DC

## 2016-02-28 NOTE — Progress Notes (Signed)
Pt on IV insulin drip and d5%-0.45%sodium chloride IV fluids per Dr. Vista Lawman orders, in order to close anion gap to correct DKA. Dr. Vista Lawman on call until 2am, and instructed to page the on call nurse practitioner when patients anion gap lab resulted this am at 5am to determine the transition orders for patients insulin. NP L. Harduk paged, verbal order given to continue IV insulin drip as well as d5%-0.45% sodium chloride infusion because CO2 value was still too low. Patient has lab BMET ordered Q4hours. Once those results are back, the physician on day shift will follow up to determine action for IV insulin drip. Will pass information on to day shift nurse. Pt is resting, call bell in reach, bed in lowest position. Will continue to monitor closely.

## 2016-02-28 NOTE — Telephone Encounter (Signed)
Will he be an inpatient for the EUS or should I schedule as an outpatient?

## 2016-02-28 NOTE — Progress Notes (Signed)
Initial Nutrition Assessment  DOCUMENTATION CODES:   Severe malnutrition in context of acute illness/injury  INTERVENTION:   Once diet is advanced, provide Glucerna Shake po TID, each supplement provides 220 kcal and 10 grams of protein RD to continue to monitor  NUTRITION DIAGNOSIS:   Malnutrition related to acute illness as evidenced by percent weight loss, mild depletion of body fat, moderate depletions of muscle mass, energy intake < or equal to 50% for > or equal to 5 days.  GOAL:   Patient will meet greater than or equal to 90% of their needs  MONITOR:   PO intake, Supplement acceptance, Diet advancement, Labs, Weight trends, I & O's  REASON FOR ASSESSMENT:   Malnutrition Screening Tool    ASSESSMENT:   60 y/o with 48 lb weight lossin 3 mo, fatigue, jaundice, GERD, Esophageal stricture, DM2. He presented to the ER for fatigue, weight loss and jaundince. His PCP had ordered CT of chest/abd/pelvis but he could not weight for this to be done as outpt. His scans in the ER showed bile duct obstruction due to a pancreatitic head mass.   Pt in room with wife at bedside. Pt states he has not had any liquids so far today, only ice chips. States he is very hungry and really wanting some liquids. Per wife, pt's PO intake has progressively gotten worse over the past month. Pt states he tried to eat protein bars and Boost drinks but he was still losing weight. When he was consuming these protein supplements he was not eating meals, only bites. Pt with poorly controlled DM, in which pt states he wasn't following the diabetic diet because he wasn't eating that much so he ate whatever he wanted.  Pt awaiting procedure. He has been ordered Glucerna shakes.  Per weight history, pt has lost 52 lb since 4/6 (23% wt loss x 4 months, significant for time frame). Nutrition-Focused physical exam completed. Findings are mild fat depletion, moderate muscle depletion, and no edema.   Medications:  Colace BID, Multivitamin with minerals daily Labs reviewed: CBGs: 152-167 Low Na  Diet Order:  Diet full liquid Room service appropriate? Yes; Fluid consistency: Thin  Skin:  Reviewed, no issues  Last BM:  7/30  Height:   Ht Readings from Last 1 Encounters:  02/27/16 5\' 10"  (1.778 m)    Weight:   Wt Readings from Last 1 Encounters:  02/27/16 175 lb 14.8 oz (79.8 kg)    Ideal Body Weight:  75.5 kg  BMI:  Body mass index is 25.24 kg/m.  Estimated Nutritional Needs:   Kcal:  2100-2300  Protein:  105-115g  Fluid:  2.1L/day  EDUCATION NEEDS:   No education needs identified at this time  Clayton Bibles, MS, RD, LDN Pager: [phone] After Hours Pager: [phone]

## 2016-02-28 NOTE — Telephone Encounter (Signed)
Pt's wife called to advise pt is in the hospital and was summoned to jury on 8/03. Wife is going to bring by his paperwork so he could be excused.  Pt is in [facility], room 1427. Pt has a blockage that is pressing on his liver and pancreas.

## 2016-02-28 NOTE — Telephone Encounter (Signed)
-----   Message from Milus Banister, MD sent at 02/28/2016  3:36 PM EDT ----- Beatrix Fetters We'll get him a spot for EUS next Thursday.  Thanks  Mallie Mussel, A plastic stent is preferable (less air interference for eventual EUS) if that is possible.  Also, if you can send a brushing of the stricture that'd be great. Usually not positive but if it IS positive then he will not need FNA of the primary and I'll just focus on evaluating the node(s) and vascular involvement.  Thanks  Janiel Crisostomo, Can you please work with inpatient team to put him on for upper EUS next Thursday Aug 10th (MAC, radial +/- linear, for pancreatic mass)  ----- Message ----- From: Manus Gunning, MD Sent: 02/28/2016  12:49 PM To: Milus Banister, MD  Melissa Montane, I know you are off most of this week but wanted to see about an EUS for this guy next week.  Admitted with obstructive jaundice, likely pancreatic cancer. Mallie Mussel is going to stent him with ERCP while he's admitted but was thinking he needs EUS for tissue and staging. He has a suspicious lymph node on CT.  Do you think he needs EUS, and if so, any availability next week? Thanks, hope you are enjoying the time off.   Richardson Landry

## 2016-02-28 NOTE — Consult Note (Signed)
Consultation  Referring Provider: [facility]- Dr Wynelle Cleveland Primary Care Physician:  [name], MD Primary Gastroenterologist:  Former Dr Olevia Perches  Reason for Consultation:   Weight loss, jaundice, abnormal CT  HPI: Colton Bush is a 60 y.o. male  who was admitted through the emergency room last evening after presenting with progressive fatigue to the point he was unable to go to work. Patient recently been evaluated by Dr. Sarajane Jews last week for complaints of 43 pound weight loss over the past 3 months associated with poor appetite. He was scheduled for CT of the abdomen and pelvis which was pending. He also has adult-onset diabetes mellitus which has been poorly controlled. Workup through the emergency room yesterday showed total bilirubin of 11.2 alkaline phosphatase 821, AST 151 and ALT of 336, lipase 216. CT of the abdomen and pelvis shows a dilated common bile duct to 1.5 cm, pancreatic duct dilated to 1.2 cm and diffuse heterogeneity of the pancreatic head ill-defined and most consistent with a pancreatic carcinoma. Prominent lymph nodes in the upper abdomen concerning for metastatic lymphadenopathy, no liver metastases noted. Patient says he has been feeling poorly over the past 3-4 months, he had been working 6-7 days per week and he and his wife thought that his exhaustion was likely due to being overworked. His appetite is been poor and he has rapidly lost at least 40 pounds over the past 3 months. He says at this point he just can't eat much at all and has absolutely no appetite. He denies any abdominal pain, no nausea or vomiting. No fever or chills He says he is just exhausted He had noticed that his urine looked dark over the past week, his wife and he had not noticed that he was jaundiced until this was mentioned by Dr. Sarajane Jews on 02/23/2016.    Past Medical History:  Diagnosis Date  . Arthritis    left hand  . Bronchitis 1977  . Depression    takes Cymbalta daily  .  Diabetes mellitus type II    sees [name]   . GERD (gastroesophageal reflux disease)    takes Omeprazole daily  . H/O hiatal hernia   . Hyperlipidemia    takes Zocor daily  . Hypertension    takes Amlodipine daily  . Neck pain    C4-7 stenosis and herniated disc  . Neuromuscular disorder ([place])    hiatal hernia  . Scoliosis    slight  . Spinal cord injury, C5-C7 ([place])    c4-c7  . Stiffness of hand joint    d/t cervical issues    Past Surgical History:  Procedure Laterality Date  . ANTERIOR CERVICAL DECOMP/DISCECTOMY FUSION  08/18/2011   Procedure: ANTERIOR CERVICAL DECOMPRESSION/DISCECTOMY FUSION 3 LEVELS;  Surgeon: Winfield Cunas, MD;  Location: [facility];  Service: Neurosurgery;  Laterality: N/A;  Anterior Cervical Four-Five/Five-Six/Six-Seven Decompression with Fusion, Plating, and Bonegraft  . CARPAL TUNNEL RELEASE  2013   bilateral, per Dr. Christella Noa   . COLONOSCOPY  10-30-14   per Dr. Olevia Perches, clear, repeat in 10 yrs   . egd with esophageal dilation  9-08   per Dr. Olevia Perches  . lymph nodes biopsy    . melanoma rt calf  1999  . SPINE SURGERY    . TONSILLECTOMY     as a child  . ULNAR TUNNEL RELEASE  2013   right arm, per Dr. Christella Noa   . UPPER GASTROINTESTINAL ENDOSCOPY      Prior to Admission  medications   Medication Sig Start Date End Date Taking? Authorizing Provider  amLODipine-benazepril (LOTREL) 10-20 MG capsule Take 1 capsule by mouth daily.   Yes Historical Provider, MD  aspirin EC 81 MG tablet Take 81 mg by mouth daily.   Yes Historical Provider, MD  Dulaglutide (TRULICITY) [id number] SOPN Inject 0.75 mg into the skin once a week. Pt uses on Sunday.   Yes Historical Provider, MD  empagliflozin (JARDIANCE) 25 MG TABS tablet Take 25 mg by mouth daily.   Yes Historical Provider, MD  glipiZIDE (GLUCOTROL XL) 10 MG 24 hr tablet Take 10 mg by mouth daily with breakfast.   Yes Historical Provider, MD  metFORMIN (GLUCOPHAGE-XR) 750 MG 24 hr tablet Take 1,500 mg by  mouth daily with breakfast.   Yes Historical Provider, MD  Multiple Vitamin (MULTIVITAMIN WITH MINERALS) TABS tablet Take 1 tablet by mouth daily.   Yes Historical Provider, MD  omega-3 acid ethyl esters (LOVAZA) 1 g capsule Take 2 g by mouth daily.   Yes Historical Provider, MD  omeprazole (PRILOSEC) 20 MG capsule Take 20 mg by mouth daily.   Yes Historical Provider, MD  pravastatin (PRAVACHOL) 40 MG tablet Take 40 mg by mouth at bedtime.   Yes Historical Provider, MD    Current Facility-Administered Medications  Medication Dose Route Frequency Provider Last Rate Last Dose  . 0.9 %  sodium chloride infusion   Intravenous Continuous Saima Rizwan, MD      . 0.9 %  sodium chloride infusion   Intravenous Continuous Benito Mccreedy, MD 125 mL/hr at 02/28/16 0956    . antiseptic oral rinse (CPC / CETYLPYRIDINIUM CHLORIDE 0.05%) solution 7 mL  7 mL Mouth Rinse q12n4p Benito Mccreedy, MD      . chlorhexidine (PERIDEX) 0.12 % solution 15 mL  15 mL Mouth Rinse BID Benito Mccreedy, MD   15 mL at 02/28/16 0957  . dextrose 50 % solution 25 mL  25 mL Intravenous PRN Benito Mccreedy, MD      . docusate sodium (COLACE) capsule 100 mg  100 mg Oral BID Benito Mccreedy, MD   100 mg at 02/28/16 0957  . enoxaparin (LOVENOX) injection 40 mg  40 mg Subcutaneous Q24H Benito Mccreedy, MD   40 mg at 02/27/16 2305  . insulin aspart (novoLOG) injection 0-9 Units  0-9 Units Subcutaneous Q4H Saima Rizwan, MD      . insulin regular bolus via infusion 0-10 Units  0-10 Units Intravenous TID WC Benito Mccreedy, MD      . magnesium citrate solution 1 Bottle  1 Bottle Oral Once PRN Benito Mccreedy, MD      . morphine 2 MG/ML injection 2 mg  2 mg Intravenous Q4H PRN Benito Mccreedy, MD      . multivitamin with minerals tablet 1 tablet  1 tablet Oral Daily Benito Mccreedy, MD   1 tablet at 02/28/16 0957  . ondansetron (ZOFRAN) tablet 4 mg  4 mg Oral Q6H PRN Benito Mccreedy, MD       Or  . ondansetron  [facility]) injection 4 mg  4 mg Intravenous Q6H PRN Benito Mccreedy, MD      . oxyCODONE (Oxy IR/ROXICODONE) immediate release tablet 5 mg  5 mg Oral Q4H PRN Benito Mccreedy, MD   5 mg at 02/28/16 0306  . pantoprazole (PROTONIX) EC tablet 40 mg  40 mg Oral Daily Benito Mccreedy, MD   40 mg at 02/28/16 0957  . polyethylene glycol (MIRALAX / GLYCOLAX) packet 17 g  17  g Oral Daily PRN Benito Mccreedy, MD      . sorbitol 70 % solution 30 mL  30 mL Oral Daily PRN Benito Mccreedy, MD      . traZODone (DESYREL) tablet 50 mg  50 mg Oral QHS PRN Benito Mccreedy, MD        Allergies as of 02/27/2016  . (No Known Allergies)    Family History  Problem Relation Age of Onset  . Heart disease Father   . Heart disease Brother 67  . Anesthesia problems Mother   . Heart disease Mother   . Dementia Mother   . Diabetes Sister   . Stroke Sister   . Colon cancer Neg Hx   . Rectal cancer Neg Hx   . Stomach cancer Neg Hx     Social History   Social History  . Marital status: Married    Spouse name: N/A  . Number of children: N/A  . Years of education: N/A   Occupational History  . Not on file.   Social History Main Topics  . Smoking status: Never Smoker  . Smokeless tobacco: Never Used     Comment: tried a pipe 35 years ago   . Alcohol use No  . Drug use: No  . Sexual activity: Yes   Other Topics Concern  . Not on file   Social History Narrative  . No narrative on file    Review of Systems: Pertinent positive and negative review of systems were noted in the above HPI section.  All other review of systems was otherwise negative.  Physical Exam: Vital signs in last 24 hours: Temp:  [97.4 F (36.3 C)-98.3 F (36.8 C)] 97.8 F (36.6 C) (07/31 0425) Pulse Rate:  [78-112] 78 (07/31 0425) Resp:  [14-19] 16 (07/31 0425) BP: (92-126)/(53-81) 96/54 (07/31 0425) SpO2:  [98 %-100 %] 100 % (07/31 0425) Weight:  [175 lb 14.8 oz (79.8 kg)-185 lb (83.9 kg)] 175 lb 14.8 oz (79.8 kg)  (07/30 2105) Last BM Date: 02/27/16 General:   Alert,  Well-developed,Chronically ill-appearing, jaundiced and fatigued white male , pleasant and cooperative in NAD Head:  Normocephalic and atraumatic. Eyes:  Sclera icteric,  Conjunctiva pink. Ears:  Normal auditory acuity. Nose:  No deformity, discharge,  or lesions. Mouth:  No deformity or lesions.   Neck:  Supple; no masses or thyromegaly. Lungs:  Clear throughout to auscultation.   No wheezes, crackles, or rhonchi. Heart:  Regular rate and rhythm; no murmurs, clicks, rubs,  or gallops. Abdomen:  Soft,nontender, BS active,nonpalp mass or hsm.   Rectal:  Deferred  Msk:  Symmetrical without gross deformities. . Pulses:  Normal pulses noted. Extremities:  Without clubbing or edema. Neurologic:  Alert and  oriented x4;  grossly normal neurologically. Skin:  Intact without significant lesions or rashes. Jaundiced Psych:  Alert and cooperative. Normal mood and affect.  Intake/Output from previous day: 07/30 0701 - 07/31 0700 In: 1070.4 [I.V.:1070.4] Out: 900 [Urine:900] Intake/Output this shift: No intake/output data recorded.  Lab Results:  Recent Labs  02/27/16 1647 02/27/16 2100 02/28/16 0447  WBC 12.0* 11.1* 12.5*  HGB 12.7* 12.0* 11.1*  HCT 39.7 37.3* 34.4*  PLT 449* 401* 366   BMET  Recent Labs  02/28/16 0046 02/28/16 0447 02/28/16 0830  NA 134* 132* 134*  K 3.7 3.5 4.0  CL 105 104 105  CO2 12* 14* 15*  GLUCOSE 142* 154* 170*  BUN 34* 31* 31*  CREATININE 0.74 0.76 0.59*  CALCIUM 8.8* 8.9 8.9  LFT  Recent Labs  02/28/16 0447  PROT 6.4*  ALBUMIN 2.9*  AST 142*  ALT 272*  ALKPHOS 683*  BILITOT 11.0*  BILIDIR 6.6*  IBILI 4.4*   PT/INR  Recent Labs  02/27/16 2100 02/28/16 0447  LABPROT 13.3 13.3  INR 1.01 1.01   Hepatitis Panel  Recent Labs  02/25/16 1550  HEPBSAG NEGATIVE  HCVAB NEGATIVE      IMPRESSION:  #34 60 year old white male with 3 month history of progressive fatigue and  significant weight loss of 40 pounds. Patient has presented jaundiced, and CT scan shows marked common bile duct and pancreatic duct dilation and diffuse heterogeneity of the pancreatic head was consistent with pancreatic carcinoma. He also has prominent upper abdominal lymph nodes concerning for metastatic lymphadenopathy.  #2 adult-onset diabetes mellitus #3 hypertension   Plan; CT findings discussed in detail with the patient and his wife. Will discuss further with Dr. Havery Moros, feel he would be best served by combined ERCP stent and EUS with biopsy with Dr. Ardis Hughs which we will need to schedule for later this week.  Check CEA and CA-19-9,PT/INR Start full liquid diet and add Glucerna supplements 3 times daily. Thank you, we will follow with you      Chaden Doom  02/28/2016, 10:14 AM

## 2016-02-28 NOTE — Telephone Encounter (Signed)
Of course we will provide a letter excusing him from jury duty.

## 2016-02-28 NOTE — Telephone Encounter (Signed)
Left a message that letter had been written. Called the juror number and pt had instructions they were not needed.  Mailed letter Dr Sarajane Jews wrote to the court house/jury division

## 2016-02-28 NOTE — Progress Notes (Signed)
PROGRESS NOTE    Colton Bush  [id number] DOB: 09-14-1955 DOA: 02/27/2016  PCP: [name], MD   Brief Narrative:  60 y/o with 48 lb weight lossin 3 mo, fatigue, jaundice, GERD, Esophageal stricture, DM2. He presented to the ER for fatigue, weight loss and jaundince. His PCP had ordered CT of chest/abd/pelvis but he could not weight for this to be done as outpt. His scans in the ER showed bile duct obstruction due to a pancreatitic head mass.   Subjective: Feels weak. No other complaints. No nausea or pain. Urine is dark and stool is still brown.   Assessment & Plan:   Principal Problem:   Obstructive jaundice due to malignant neoplasm - partial obstruction - I discussed findings with patient - CA- 19-9 - GI consult>> ERCP and EUS planned for later this week, CEA  Active Problems:   Diabetes Mellitus  - hold oral meds- follow with sliding scale insulin for now  Acidosis - non-anion gap- possibly starvation acidosis- lactic acid normal- not DKA    Hyperlipidemia, mixed - hold pravastatin    Essential hypertension - hold amlodipine/ benazepril    GERD - PPI  DVT prophylaxis: lovenox Code Status: full code Family Communication:  Disposition Plan: home after work up done Consultants:   GI Procedures:   none Antimicrobials:  Anti-infectives    None       Objective: Vitals:   02/27/16 2105 02/28/16 0304 02/28/16 0334 02/28/16 0425  BP: 126/70 (!) 94/57 (!) 96/54 (!) 96/54  Pulse: 87 79  78  Resp: 16 16  16   Temp: 97.9 F (36.6 C) 98.3 F (36.8 C)  97.8 F (36.6 C)  TempSrc: Oral Oral  Oral  SpO2: 100% 100%  100%  Weight: 79.8 kg (175 lb 14.8 oz)     Height: 5\' 10"  (1.778 m)       Intake/Output Summary (Last 24 hours) at 02/28/16 1224 Last data filed at 02/28/16 0700  Gross per 24 hour  Intake          1070.43 ml  Output              900 ml  Net           170.43 ml   Filed Weights   02/27/16 1455 02/27/16 2105  Weight: 83.9 kg (185 lb)  79.8 kg (175 lb 14.8 oz)    Examination: General exam: Appears comfortable - jaundiced HEENT: PERRLA, oral mucosa moist, + sclera icterus - no thrush Respiratory system: Clear to auscultation. Respiratory effort normal. Cardiovascular system: S1 & S2 heard, RRR.  No murmurs  Gastrointestinal system: Abdomen soft, non-tender, nondistended. Normal bowel sound. No organomegaly Central nervous system: Alert and oriented. No focal neurological deficits. Extremities: No cyanosis, clubbing or edema Skin: No rashes or ulcers Psychiatry:  Mood & affect appropriate.     Data Reviewed: I have personally reviewed following labs and imaging studies  CBC:  Recent Labs Lab 02/23/16 1118 02/27/16 1647 02/27/16 2100 02/28/16 0447  WBC 10.3 12.0* 11.1* 12.5*  NEUTROABS 8.0*  --   --   --   HGB 12.7* 12.7* 12.0* 11.1*  HCT 38.4* 39.7 37.3* 34.4*  MCV 100.8* 105.6* 104.8* 104.9*  PLT 402.0* 449* 401* [id number]   Basic Metabolic Panel:  Recent Labs Lab 02/27/16 1647 02/27/16 2100 02/28/16 0046 02/28/16 0447 02/28/16 0830  NA 131* 132* 134* 132* 134*  K 5.2* 4.8 3.7 3.5 4.0  CL 99* 102 105 104 105  CO2 12*  13* 12* 14* 15*  GLUCOSE 256* 147* 142* 154* 170*  BUN 38* 37* 34* 31* 31*  CREATININE 0.95 0.80 0.74 0.76 0.59*  CALCIUM 9.7 9.1 8.8* 8.9 8.9  MG  --  2.4  --   --   --    GFR: Estimated Creatinine Clearance: 98.9 mL/min (by C-G formula based on SCr of 0.8 mg/dL). Liver Function Tests:  Recent Labs Lab 02/23/16 1118 02/27/16 1647 02/28/16 0447  AST 196* 159* 142*  ALT 242* 336* 272*  ALKPHOS 740* 824* 683*  BILITOT 7.6* 11.2* 11.0*  PROT 7.0 7.5 6.4*  ALBUMIN 3.8 3.4* 2.9*    Recent Labs Lab 02/23/16 1118 02/27/16 1647  LIPASE 216.0* 107*  AMYLASE 56  --    No results for input(s): AMMONIA in the last 168 hours. Coagulation Profile:  Recent Labs Lab 02/27/16 1647 02/27/16 2100 02/28/16 0447  INR 1.00 1.01 1.01   Cardiac Enzymes: No results for input(s):  CKTOTAL, CKMB, CKMBINDEX, TROPONINI in the last 168 hours. BNP (last 3 results) No results for input(s): PROBNP in the last 8760 hours. HbA1C: No results for input(s): HGBA1C in the last 72 hours. CBG:  Recent Labs Lab 02/28/16 0530 02/28/16 0635 02/28/16 0732 02/28/16 0829 02/28/16 1158  GLUCAP 172* 173* 163* 167* 152*   Lipid Profile: No results for input(s): CHOL, HDL, LDLCALC, TRIG, CHOLHDL, LDLDIRECT in the last 72 hours. Thyroid Function Tests: No results for input(s): TSH, T4TOTAL, FREET4, T3FREE, THYROIDAB in the last 72 hours. Anemia Panel: No results for input(s): VITAMINB12, FOLATE, FERRITIN, TIBC, IRON, RETICCTPCT in the last 72 hours. Urine analysis:    Component Value Date/Time   COLORURINE AMBER (A) 02/28/2016 0009   APPEARANCEUR CLEAR 02/28/2016 0009   LABSPEC >1.046 (H) 02/28/2016 0009   PHURINE 5.0 02/28/2016 0009   GLUCOSEU >1000 (A) 02/28/2016 0009   HGBUR NEGATIVE 02/28/2016 0009   BILIRUBINUR MODERATE (A) 02/28/2016 0009   BILIRUBINUR 1+ 02/23/2016 1256   KETONESUR >80 (A) 02/28/2016 0009   PROTEINUR NEGATIVE 02/28/2016 0009   UROBILINOGEN 1.0 02/23/2016 1256   NITRITE NEGATIVE 02/28/2016 0009   LEUKOCYTESUR NEGATIVE 02/28/2016 0009   Sepsis Labs: @LABRCNTIP (procalcitonin:4,lacticidven:4) )No results found for this or any previous visit (from the past 240 hour(s)).       Radiology Studies: Ct Chest W Contrast  Result Date: 02/27/2016 CLINICAL DATA:  Weight loss EXAM: CT CHEST, ABDOMEN, AND PELVIS WITH CONTRAST TECHNIQUE: Multidetector CT imaging of the chest, abdomen and pelvis was performed following the standard protocol during bolus administration of intravenous contrast. CONTRAST:  114mL ISOVUE-300 IOPAMIDOL (ISOVUE-300) INJECTION 61% COMPARISON:  None. FINDINGS: CT CHEST FINDINGS Mediastinum/Lymph Nodes: Heart size is normal. No pericardial effusion. Thoracic aorta is normal in caliber and configuration. No mass or enlarged lymph nodes seen  within the mediastinum or perihilar regions. Trachea and central bronchi are unremarkable. Lungs/Pleura: Lungs are clear. No pulmonary nodule or mass. No consolidation. No pleural effusion or pneumothorax. Musculoskeletal: Dextroscoliosis of the thoracic spine, measuring approximately 25 degrees. Mild degenerative change within the thoracic spine. No acute or suspicious osseous finding. Superficial soft tissues are unremarkable. CT ABDOMEN PELVIS FINDINGS Hepatobiliary: Diffuse intrahepatic bile duct dilatation. Common bile duct distended to approximately 1.5 cm. No focal enhancing mass or lesion identified within the liver. Gallbladder is moderately distended but otherwise unremarkable. Pancreas: Pancreatic duct dilatation, dilated up to approximately 1.2 cm. Heterogeneity of the pancreatic head, ill-defined, almost certainly representing a pancreatic carcinoma. Spleen: Within normal limits in size and appearance. Adrenals/Urinary Tract: Adrenal glands  appear normal. Kidneys appear normal without stone or hydronephrosis. Stomach/Bowel: Bowel is normal in caliber. No bowel wall thickening or evidence of bowel wall inflammation. Appendix is normal. Vascular/Lymphatic: Mildly prominent lymph nodes within the upper abdomen, including a 2 cm short axis lymph node in the portacaval space. No enlarged lymph nodes elsewhere in the abdomen or pelvis. Mild atherosclerotic changes of the normal caliber abdominal aorta. Reproductive: No mass or other significant abnormality. Other: No free fluid or abscess collection identified. No free intraperitoneal air. Musculoskeletal: Degenerative changes throughout the thoracolumbar spine, moderate in degree. No acute or suspicious osseous finding. Sclerotic lesion within the left iliac bone is most likely a benign bone island. Superficial soft tissues are unremarkable. IMPRESSION: 1. Heterogeneity of the pancreatic head, ill-defined limiting measurement, almost certainly representing a  pancreatic carcinoma given the combination of bile duct dilatation and pancreatic duct dilatation. Recommend pancreas MRI for further characterization. 2. Mildly prominent lymph nodes within the upper abdomen, suspicious for local metastatic lymphadenopathy, particularly prominent lymph node in the portacaval space measuring 2 cm short axis dimension. No enlarged lymph nodes elsewhere in the abdomen or pelvis. No evidence of distant metastasis. 3. Normal chest CT. 4. Scoliosis. No evidence of osseous metastasis. Probable bone island within the left iliac bone. These results were called by telephone at the time of interpretation on 02/27/2016 at 7:15 pm to Dr. Quintella Reichert , who verbally acknowledged these results. Electronically Signed   By: Franki Cabot M.D.   On: 02/27/2016 19:23  Ct Abdomen Pelvis W Contrast  Result Date: 02/27/2016 CLINICAL DATA:  Weight loss EXAM: CT CHEST, ABDOMEN, AND PELVIS WITH CONTRAST TECHNIQUE: Multidetector CT imaging of the chest, abdomen and pelvis was performed following the standard protocol during bolus administration of intravenous contrast. CONTRAST:  181mL ISOVUE-300 IOPAMIDOL (ISOVUE-300) INJECTION 61% COMPARISON:  None. FINDINGS: CT CHEST FINDINGS Mediastinum/Lymph Nodes: Heart size is normal. No pericardial effusion. Thoracic aorta is normal in caliber and configuration. No mass or enlarged lymph nodes seen within the mediastinum or perihilar regions. Trachea and central bronchi are unremarkable. Lungs/Pleura: Lungs are clear. No pulmonary nodule or mass. No consolidation. No pleural effusion or pneumothorax. Musculoskeletal: Dextroscoliosis of the thoracic spine, measuring approximately 25 degrees. Mild degenerative change within the thoracic spine. No acute or suspicious osseous finding. Superficial soft tissues are unremarkable. CT ABDOMEN PELVIS FINDINGS Hepatobiliary: Diffuse intrahepatic bile duct dilatation. Common bile duct distended to approximately 1.5 cm. No  focal enhancing mass or lesion identified within the liver. Gallbladder is moderately distended but otherwise unremarkable. Pancreas: Pancreatic duct dilatation, dilated up to approximately 1.2 cm. Heterogeneity of the pancreatic head, ill-defined, almost certainly representing a pancreatic carcinoma. Spleen: Within normal limits in size and appearance. Adrenals/Urinary Tract: Adrenal glands appear normal. Kidneys appear normal without stone or hydronephrosis. Stomach/Bowel: Bowel is normal in caliber. No bowel wall thickening or evidence of bowel wall inflammation. Appendix is normal. Vascular/Lymphatic: Mildly prominent lymph nodes within the upper abdomen, including a 2 cm short axis lymph node in the portacaval space. No enlarged lymph nodes elsewhere in the abdomen or pelvis. Mild atherosclerotic changes of the normal caliber abdominal aorta. Reproductive: No mass or other significant abnormality. Other: No free fluid or abscess collection identified. No free intraperitoneal air. Musculoskeletal: Degenerative changes throughout the thoracolumbar spine, moderate in degree. No acute or suspicious osseous finding. Sclerotic lesion within the left iliac bone is most likely a benign bone island. Superficial soft tissues are unremarkable. IMPRESSION: 1. Heterogeneity of the pancreatic head, ill-defined limiting measurement,  almost certainly representing a pancreatic carcinoma given the combination of bile duct dilatation and pancreatic duct dilatation. Recommend pancreas MRI for further characterization. 2. Mildly prominent lymph nodes within the upper abdomen, suspicious for local metastatic lymphadenopathy, particularly prominent lymph node in the portacaval space measuring 2 cm short axis dimension. No enlarged lymph nodes elsewhere in the abdomen or pelvis. No evidence of distant metastasis. 3. Normal chest CT. 4. Scoliosis. No evidence of osseous metastasis. Probable bone island within the left iliac bone. These  results were called by telephone at the time of interpretation on 02/27/2016 at 7:15 pm to Dr. Quintella Reichert , who verbally acknowledged these results. Electronically Signed   By: Franki Cabot M.D.   On: 02/27/2016 19:23     Scheduled Meds: . antiseptic oral rinse  7 mL Mouth Rinse q12n4p  . chlorhexidine  15 mL Mouth Rinse BID  . docusate sodium  100 mg Oral BID  . enoxaparin (LOVENOX) injection  40 mg Subcutaneous Q24H  . feeding supplement (GLUCERNA SHAKE)  237 mL Oral TID BM  . insulin aspart  0-9 Units Subcutaneous Q4H  . insulin regular  0-10 Units Intravenous TID WC  . multivitamin with minerals  1 tablet Oral Daily  . pantoprazole  40 mg Oral Daily   Continuous Infusions: . sodium chloride    . sodium chloride 125 mL/hr at 02/28/16 0956     LOS: 1 day    Time spent in minutes: 2    Wing, MD [facility] Pager: [email or web address] Password [email or web address] 02/28/2016, 12:24 PM

## 2016-02-29 ENCOUNTER — Telehealth: Payer: Self-pay | Admitting: Family Medicine

## 2016-02-29 DIAGNOSIS — K831 Obstruction of bile duct: Secondary | ICD-10-CM

## 2016-02-29 DIAGNOSIS — E118 Type 2 diabetes mellitus with unspecified complications: Secondary | ICD-10-CM

## 2016-02-29 LAB — BASIC METABOLIC PANEL
Anion gap: 10 (ref 5–15)
BUN: 18 mg/dL (ref 6–20)
CO2: 16 mmol/L — ABNORMAL LOW (ref 22–32)
Calcium: 8.5 mg/dL — ABNORMAL LOW (ref 8.9–10.3)
Chloride: 105 mmol/L (ref 101–111)
Creatinine, Ser: 0.5 mg/dL — ABNORMAL LOW (ref 0.61–1.24)
GFR calc Af Amer: >60 mL/min (ref >60)
GFR calc non Af Amer: >60 mL/min (ref >60)
Glucose, Bld: 198 mg/dL — ABNORMAL HIGH (ref 65–99)
Potassium: 3.7 mmol/L (ref 3.5–5.1)
Sodium: 131 mmol/L — ABNORMAL LOW (ref 135–145)

## 2016-02-29 LAB — COMPREHENSIVE METABOLIC PANEL
ALT: 225 U/L — ABNORMAL HIGH (ref 17–63)
AST: 109 U/L — ABNORMAL HIGH (ref 15–41)
Albumin: 2.6 g/dL — ABNORMAL LOW (ref 3.5–5.0)
Alkaline Phosphatase: 665 U/L — ABNORMAL HIGH (ref 38–126)
Anion gap: 12 (ref 5–15)
BUN: 17 mg/dL (ref 6–20)
CO2: 16 mmol/L — ABNORMAL LOW (ref 22–32)
Calcium: 8.3 mg/dL — ABNORMAL LOW (ref 8.9–10.3)
Chloride: 103 mmol/L (ref 101–111)
Creatinine, Ser: 0.61 mg/dL (ref 0.61–1.24)
GFR calc Af Amer: >60 mL/min (ref >60)
GFR calc non Af Amer: >60 mL/min (ref >60)
Glucose, Bld: 181 mg/dL — ABNORMAL HIGH (ref 65–99)
Potassium: 3.4 mmol/L — ABNORMAL LOW (ref 3.5–5.1)
Sodium: 131 mmol/L — ABNORMAL LOW (ref 135–145)
Total Bilirubin: 7.6 mg/dL — ABNORMAL HIGH (ref 0.3–1.2)
Total Protein: 6 g/dL — ABNORMAL LOW (ref 6.5–8.1)

## 2016-02-29 LAB — CBC
HCT: 31.5 % — ABNORMAL LOW (ref 39.0–52.0)
Hemoglobin: 10.4 g/dL — ABNORMAL LOW (ref 13.0–17.0)
MCH: 34.3 pg — ABNORMAL HIGH (ref 26.0–34.0)
MCHC: 33 g/dL (ref 30.0–36.0)
MCV: 104 fL — ABNORMAL HIGH (ref 78.0–100.0)
Platelets: 311 10*3/uL (ref 150–400)
RBC: 3.03 MIL/uL — ABNORMAL LOW (ref 4.22–5.81)
RDW: 14.4 % (ref 11.5–15.5)
WBC: 8.2 10*3/uL (ref 4.0–10.5)

## 2016-02-29 LAB — URINE CULTURE: Culture: <10000 — AB

## 2016-02-29 LAB — GLUCOSE, CAPILLARY
Glucose-Capillary: 169 mg/dL — ABNORMAL HIGH (ref 65–99)
Glucose-Capillary: 182 mg/dL — ABNORMAL HIGH (ref 65–99)
Glucose-Capillary: 189 mg/dL — ABNORMAL HIGH (ref 65–99)
Glucose-Capillary: 237 mg/dL — ABNORMAL HIGH (ref 65–99)
Glucose-Capillary: 260 mg/dL — ABNORMAL HIGH (ref 65–99)
Glucose-Capillary: 323 mg/dL — ABNORMAL HIGH (ref 65–99)

## 2016-02-29 LAB — CANCER ANTIGEN 19-9: CA 19-9: 1497 U/mL — ABNORMAL HIGH (ref 0–35)

## 2016-02-29 MED ORDER — POTASSIUM CHLORIDE CRYS ER 20 MEQ PO TBCR
40.0000 meq | EXTENDED_RELEASE_TABLET | ORAL | Status: AC
  Administered 2016-02-29 (×2): 40 meq via ORAL
  Filled 2016-02-29 (×2): qty 2

## 2016-02-29 NOTE — Progress Notes (Signed)
PROGRESS NOTE    Colton Bush  [id number] DOB: 1956-07-03 DOA: 02/27/2016  PCP: [name], MD   Brief Narrative:  60 y/o with 48 lb weight lossin 3 mo, fatigue, jaundice, GERD, Esophageal stricture, DM2. He presented to the ER for fatigue, weight loss and jaundince. His PCP had ordered CT of chest/abd/pelvis but he could not weight for this to be done as outpt. His scans in the ER showed bile duct obstruction due to a pancreatitic head mass.   Subjective: Eating well. No complaints.   Assessment & Plan:   Principal Problem:   Obstructive jaundice due to malignant neoplasm - partial obstruction - I discussed findings with patient - CA- 19-9 very high-1497 - GI consult>> ERCP and EUS planned for later this week   Active Problems:   Diabetes Mellitus  - hold oral meds- follow with sliding scale insulin for now  Acidosis - non-anion gap- possibly starvation acidosis- lactic acid normal- not DKA  Hypokalemia - replace    Hyperlipidemia, mixed - hold pravastatin    Essential hypertension - hold amlodipine/ benazepril    GERD - PPI  DVT prophylaxis: lovenox Code Status: full code Family Communication:  Disposition Plan: home after work up done Consultants:   GI Procedures:   none Antimicrobials:  Anti-infectives    Start     Dose/Rate Route Frequency Ordered Stop   02/28/16 1400  ampicillin-sulbactam (UNASYN) 1.5 g in sodium chloride 0.9 % 50 mL IVPB     1.5 g 100 mL/hr over 30 Minutes Intravenous Every 6 hours 02/28/16 1300         Objective: Vitals:   02/28/16 1406 02/28/16 2112 02/29/16 0411 02/29/16 1028  BP: (!) 107/56 130/63 127/69 127/85  Pulse: 65 100 82 84  Resp: 17 18 16 16   Temp: 98 F (36.7 C) 99.1 F (37.3 C) 97.8 F (36.6 C) 99 F (37.2 C)  TempSrc: Oral Oral Oral Oral  SpO2: 100% 97% 100% 97%  Weight:      Height:        Intake/Output Summary (Last 24 hours) at 02/29/16 1344 Last data filed at 02/29/16 1207  Gross per  24 hour  Intake          5230.41 ml  Output             3100 ml  Net          2130.41 ml   Filed Weights   02/27/16 1455 02/27/16 2105  Weight: 83.9 kg (185 lb) 79.8 kg (175 lb 14.8 oz)    Examination: General exam: Appears comfortable - jaundiced HEENT: PERRLA, oral mucosa moist, + sclera icterus - no thrush Respiratory system: Clear to auscultation. Respiratory effort normal. Cardiovascular system: S1 & S2 heard, RRR.  No murmurs  Gastrointestinal system: Abdomen soft, non-tender, nondistended. Normal bowel sound. No organomegaly Central nervous system: Alert and oriented. No focal neurological deficits. Extremities: No cyanosis, clubbing or edema Skin: No rashes or ulcers Psychiatry:  Mood & affect appropriate.     Data Reviewed: I have personally reviewed following labs and imaging studies  CBC:  Recent Labs Lab 02/23/16 1118 02/27/16 1647 02/27/16 2100 02/28/16 0447 02/29/16 0504  WBC 10.3 12.0* 11.1* 12.5* 8.2  NEUTROABS 8.0*  --   --   --   --   HGB 12.7* 12.7* 12.0* 11.1* 10.4*  HCT 38.4* 39.7 37.3* 34.4* 31.5*  MCV 100.8* 105.6* 104.8* 104.9* 104.0*  PLT 402.0* 449* 401* 366 [id number]   Basic Metabolic  Panel:  Recent Labs Lab 02/27/16 2100  02/28/16 1250 02/28/16 1453 02/28/16 2023 02/29/16 0029 02/29/16 0504  NA 132*  < > 132* 133* 129* 131* 131*  K 4.8  < > 5.1 3.8 4.0 3.7 3.4*  CL 102  < > 102 103 102 105 103  CO2 13*  < > 14* 13* 17* 16* 16*  GLUCOSE 147*  < > 150* 230* 307* 198* 181*  BUN 37*  < > 26* 24* 19 18 17   CREATININE 0.80  < > 0.70 0.85 0.80 0.50* 0.61  CALCIUM 9.1  < > 9.1 8.7* 8.5* 8.5* 8.3*  MG 2.4  --   --   --   --   --   --   < > = values in this interval not displayed. GFR: Estimated Creatinine Clearance: 98.9 mL/min (by C-G formula based on SCr of 0.8 mg/dL). Liver Function Tests:  Recent Labs Lab 02/23/16 1118 02/27/16 1647 02/28/16 0447 02/29/16 0504  AST 196* 159* 142* 109*  ALT 242* 336* 272* 225*  ALKPHOS 740* 824*  683* 665*  BILITOT 7.6* 11.2* 11.0* 7.6*  PROT 7.0 7.5 6.4* 6.0*  ALBUMIN 3.8 3.4* 2.9* 2.6*    Recent Labs Lab 02/23/16 1118 02/27/16 1647  LIPASE 216.0* 107*  AMYLASE 56  --    No results for input(s): AMMONIA in the last 168 hours. Coagulation Profile:  Recent Labs Lab 02/27/16 1647 02/27/16 2100 02/28/16 0447  INR 1.00 1.01 1.01   Cardiac Enzymes: No results for input(s): CKTOTAL, CKMB, CKMBINDEX, TROPONINI in the last 168 hours. BNP (last 3 results) No results for input(s): PROBNP in the last 8760 hours. HbA1C: No results for input(s): HGBA1C in the last 72 hours. CBG:  Recent Labs Lab 02/28/16 2014 02/29/16 0008 02/29/16 0410 02/29/16 0742 02/29/16 1206  GLUCAP 300* 169* 182* 189* 260*   Lipid Profile: No results for input(s): CHOL, HDL, LDLCALC, TRIG, CHOLHDL, LDLDIRECT in the last 72 hours. Thyroid Function Tests: No results for input(s): TSH, T4TOTAL, FREET4, T3FREE, THYROIDAB in the last 72 hours. Anemia Panel: No results for input(s): VITAMINB12, FOLATE, FERRITIN, TIBC, IRON, RETICCTPCT in the last 72 hours. Urine analysis:    Component Value Date/Time   COLORURINE AMBER (A) 02/28/2016 0009   APPEARANCEUR CLEAR 02/28/2016 0009   LABSPEC >1.046 (H) 02/28/2016 0009   PHURINE 5.0 02/28/2016 0009   GLUCOSEU >1000 (A) 02/28/2016 0009   HGBUR NEGATIVE 02/28/2016 0009   BILIRUBINUR MODERATE (A) 02/28/2016 0009   BILIRUBINUR 1+ 02/23/2016 1256   KETONESUR >80 (A) 02/28/2016 0009   PROTEINUR NEGATIVE 02/28/2016 0009   UROBILINOGEN 1.0 02/23/2016 1256   NITRITE NEGATIVE 02/28/2016 0009   LEUKOCYTESUR NEGATIVE 02/28/2016 0009   Sepsis Labs: @LABRCNTIP (procalcitonin:4,lacticidven:4) ) Recent Results (from the past 240 hour(s))  Urine culture     Status: Abnormal   Collection Time: 02/28/16 12:09 AM  Result Value Ref Range Status   Specimen Description URINE, CLEAN CATCH  Final   Special Requests NONE  Final   Culture (A)  Final    <10,000  COLONIES/mL INSIGNIFICANT GROWTH Performed at [facility]    Report Status 02/29/2016 FINAL  Final         Radiology Studies: Ct Chest W Contrast  Result Date: 02/27/2016 CLINICAL DATA:  Weight loss EXAM: CT CHEST, ABDOMEN, AND PELVIS WITH CONTRAST TECHNIQUE: Multidetector CT imaging of the chest, abdomen and pelvis was performed following the standard protocol during bolus administration of intravenous contrast. CONTRAST:  128mL ISOVUE-300 IOPAMIDOL (ISOVUE-300) INJECTION  61% COMPARISON:  None. FINDINGS: CT CHEST FINDINGS Mediastinum/Lymph Nodes: Heart size is normal. No pericardial effusion. Thoracic aorta is normal in caliber and configuration. No mass or enlarged lymph nodes seen within the mediastinum or perihilar regions. Trachea and central bronchi are unremarkable. Lungs/Pleura: Lungs are clear. No pulmonary nodule or mass. No consolidation. No pleural effusion or pneumothorax. Musculoskeletal: Dextroscoliosis of the thoracic spine, measuring approximately 25 degrees. Mild degenerative change within the thoracic spine. No acute or suspicious osseous finding. Superficial soft tissues are unremarkable. CT ABDOMEN PELVIS FINDINGS Hepatobiliary: Diffuse intrahepatic bile duct dilatation. Common bile duct distended to approximately 1.5 cm. No focal enhancing mass or lesion identified within the liver. Gallbladder is moderately distended but otherwise unremarkable. Pancreas: Pancreatic duct dilatation, dilated up to approximately 1.2 cm. Heterogeneity of the pancreatic head, ill-defined, almost certainly representing a pancreatic carcinoma. Spleen: Within normal limits in size and appearance. Adrenals/Urinary Tract: Adrenal glands appear normal. Kidneys appear normal without stone or hydronephrosis. Stomach/Bowel: Bowel is normal in caliber. No bowel wall thickening or evidence of bowel wall inflammation. Appendix is normal. Vascular/Lymphatic: Mildly prominent lymph nodes within the upper  abdomen, including a 2 cm short axis lymph node in the portacaval space. No enlarged lymph nodes elsewhere in the abdomen or pelvis. Mild atherosclerotic changes of the normal caliber abdominal aorta. Reproductive: No mass or other significant abnormality. Other: No free fluid or abscess collection identified. No free intraperitoneal air. Musculoskeletal: Degenerative changes throughout the thoracolumbar spine, moderate in degree. No acute or suspicious osseous finding. Sclerotic lesion within the left iliac bone is most likely a benign bone island. Superficial soft tissues are unremarkable. IMPRESSION: 1. Heterogeneity of the pancreatic head, ill-defined limiting measurement, almost certainly representing a pancreatic carcinoma given the combination of bile duct dilatation and pancreatic duct dilatation. Recommend pancreas MRI for further characterization. 2. Mildly prominent lymph nodes within the upper abdomen, suspicious for local metastatic lymphadenopathy, particularly prominent lymph node in the portacaval space measuring 2 cm short axis dimension. No enlarged lymph nodes elsewhere in the abdomen or pelvis. No evidence of distant metastasis. 3. Normal chest CT. 4. Scoliosis. No evidence of osseous metastasis. Probable bone island within the left iliac bone. These results were called by telephone at the time of interpretation on 02/27/2016 at 7:15 pm to Dr. Quintella Reichert , who verbally acknowledged these results. Electronically Signed   By: Franki Cabot M.D.   On: 02/27/2016 19:23  Ct Abdomen Pelvis W Contrast  Result Date: 02/27/2016 CLINICAL DATA:  Weight loss EXAM: CT CHEST, ABDOMEN, AND PELVIS WITH CONTRAST TECHNIQUE: Multidetector CT imaging of the chest, abdomen and pelvis was performed following the standard protocol during bolus administration of intravenous contrast. CONTRAST:  151mL ISOVUE-300 IOPAMIDOL (ISOVUE-300) INJECTION 61% COMPARISON:  None. FINDINGS: CT CHEST FINDINGS Mediastinum/Lymph  Nodes: Heart size is normal. No pericardial effusion. Thoracic aorta is normal in caliber and configuration. No mass or enlarged lymph nodes seen within the mediastinum or perihilar regions. Trachea and central bronchi are unremarkable. Lungs/Pleura: Lungs are clear. No pulmonary nodule or mass. No consolidation. No pleural effusion or pneumothorax. Musculoskeletal: Dextroscoliosis of the thoracic spine, measuring approximately 25 degrees. Mild degenerative change within the thoracic spine. No acute or suspicious osseous finding. Superficial soft tissues are unremarkable. CT ABDOMEN PELVIS FINDINGS Hepatobiliary: Diffuse intrahepatic bile duct dilatation. Common bile duct distended to approximately 1.5 cm. No focal enhancing mass or lesion identified within the liver. Gallbladder is moderately distended but otherwise unremarkable. Pancreas: Pancreatic duct dilatation, dilated up to approximately 1.2  cm. Heterogeneity of the pancreatic head, ill-defined, almost certainly representing a pancreatic carcinoma. Spleen: Within normal limits in size and appearance. Adrenals/Urinary Tract: Adrenal glands appear normal. Kidneys appear normal without stone or hydronephrosis. Stomach/Bowel: Bowel is normal in caliber. No bowel wall thickening or evidence of bowel wall inflammation. Appendix is normal. Vascular/Lymphatic: Mildly prominent lymph nodes within the upper abdomen, including a 2 cm short axis lymph node in the portacaval space. No enlarged lymph nodes elsewhere in the abdomen or pelvis. Mild atherosclerotic changes of the normal caliber abdominal aorta. Reproductive: No mass or other significant abnormality. Other: No free fluid or abscess collection identified. No free intraperitoneal air. Musculoskeletal: Degenerative changes throughout the thoracolumbar spine, moderate in degree. No acute or suspicious osseous finding. Sclerotic lesion within the left iliac bone is most likely a benign bone island. Superficial  soft tissues are unremarkable. IMPRESSION: 1. Heterogeneity of the pancreatic head, ill-defined limiting measurement, almost certainly representing a pancreatic carcinoma given the combination of bile duct dilatation and pancreatic duct dilatation. Recommend pancreas MRI for further characterization. 2. Mildly prominent lymph nodes within the upper abdomen, suspicious for local metastatic lymphadenopathy, particularly prominent lymph node in the portacaval space measuring 2 cm short axis dimension. No enlarged lymph nodes elsewhere in the abdomen or pelvis. No evidence of distant metastasis. 3. Normal chest CT. 4. Scoliosis. No evidence of osseous metastasis. Probable bone island within the left iliac bone. These results were called by telephone at the time of interpretation on 02/27/2016 at 7:15 pm to Dr. Quintella Reichert , who verbally acknowledged these results. Electronically Signed   By: Franki Cabot M.D.   On: 02/27/2016 19:23     Scheduled Meds: . ampicillin-sulbactam (UNASYN) IV  1.5 g Intravenous Q6H  . antiseptic oral rinse  7 mL Mouth Rinse q12n4p  . chlorhexidine  15 mL Mouth Rinse BID  . docusate sodium  100 mg Oral BID  . feeding supplement (GLUCERNA SHAKE)  237 mL Oral TID BM  . insulin aspart  0-5 Units Subcutaneous QHS  . insulin aspart  0-9 Units Subcutaneous TID WC  . multivitamin with minerals  1 tablet Oral Daily  . pantoprazole  40 mg Oral Daily   Continuous Infusions: . sodium chloride 125 mL/hr at 02/28/16 1711  . sodium chloride 125 mL/hr at 02/29/16 0846     LOS: 2 days    Time spent in minutes: 81    [place], MD [facility] Pager: [email or web address] Password [email or web address] 02/29/2016, 1:44 PM

## 2016-02-29 NOTE — Telephone Encounter (Signed)
He will probably be an outpatient by next Thursday.  Thanks

## 2016-02-29 NOTE — Telephone Encounter (Signed)
error 

## 2016-02-29 NOTE — Progress Notes (Signed)
Inpatient Diabetes Program Recommendations  AACE/ADA: New Consensus Statement on Inpatient Glycemic Control (2015)  Target Ranges:  Prepandial:   less than 140 mg/dL      Peak postprandial:   less than 180 mg/dL (1-2 hours)      Critically ill patients:  140 - 180 mg/dL   Lab Results  Component Value Date   GLUCAP 260 (H) 02/29/2016   HGBA1C 7.9 (H) 02/07/2016    Review of Glycemic Control  Pt will be NPO after MN for ERCP tomorrow am.  Inpatient Diabetes Program Recommendations:    Consider addition of Novolog 4 units tidwc beginning 8/1. Can resume meal coverage after procedure. Consider CHO mod med diet.  Will follow.  Thank you. Lorenda Peck, RD, LDN, CDE Inpatient Diabetes Coordinator [phone]

## 2016-02-29 NOTE — Progress Notes (Signed)
Progress Note   Subjective  Patient reports feeling improved appetite this AM and ate a good breakfast. He denies any significant abdominal pain. Afebrile. LAEs improved today. CA 19-9 returned quite elevated.    Objective   Vital signs in last 24 hours: Temp:  [97.8 F (36.6 C)-99.1 F (37.3 C)] 97.8 F (36.6 C) (08/01 0411) Pulse Rate:  [65-100] 82 (08/01 0411) Resp:  [16-18] 16 (08/01 0411) BP: (107-130)/(56-69) 127/69 (08/01 0411) SpO2:  [97 %-100 %] 100 % (08/01 0411) Last BM Date: 02/27/16 General:    White male in NAD, jaundiced Heart:  Regular rate and rhythm; no murmurs Lungs: Respirations even and unlabored, lungs CTA bilaterally Abdomen:  Soft, nontender and nondistended.  Extremities:  Without edema. Neurologic:  Alert and oriented,  grossly normal neurologically. Psych:  Cooperative. Normal mood and affect.  Intake/Output from previous day: 07/31 0701 - 08/01 0700 In: 4750.4 [P.O.:240; I.V.:4360.4; IV Piggyback:150] Out: [id number] [Urine:4050] Intake/Output this shift: Total I/O In: 240 [P.O.:240] Out: -   Lab Results:  Recent Labs  02/27/16 2100 02/28/16 0447 02/29/16 0504  WBC 11.1* 12.5* 8.2  HGB 12.0* 11.1* 10.4*  HCT 37.3* 34.4* 31.5*  PLT 401* 366 311   BMET  Recent Labs  02/28/16 2023 02/29/16 0029 02/29/16 0504  NA 129* 131* 131*  K 4.0 3.7 3.4*  CL 102 105 103  CO2 17* 16* 16*  GLUCOSE 307* 198* 181*  BUN 19 18 17   CREATININE 0.80 0.50* 0.61  CALCIUM 8.5* 8.5* 8.3*   LFT  Recent Labs  02/28/16 0447 02/29/16 0504  PROT 6.4* 6.0*  ALBUMIN 2.9* 2.6*  AST 142* 109*  ALT 272* 225*  ALKPHOS 683* 665*  BILITOT 11.0* 7.6*  BILIDIR 6.6*  --   IBILI 4.4*  --    PT/INR  Recent Labs  02/27/16 2100 02/28/16 0447  LABPROT 13.3 13.3  INR 1.01 1.01    Studies/Results: Ct Chest W Contrast  Result Date: 02/27/2016 CLINICAL DATA:  Weight loss EXAM: CT CHEST, ABDOMEN, AND PELVIS WITH CONTRAST TECHNIQUE: Multidetector CT  imaging of the chest, abdomen and pelvis was performed following the standard protocol during bolus administration of intravenous contrast. CONTRAST:  156mL ISOVUE-300 IOPAMIDOL (ISOVUE-300) INJECTION 61% COMPARISON:  None. FINDINGS: CT CHEST FINDINGS Mediastinum/Lymph Nodes: Heart size is normal. No pericardial effusion. Thoracic aorta is normal in caliber and configuration. No mass or enlarged lymph nodes seen within the mediastinum or perihilar regions. Trachea and central bronchi are unremarkable. Lungs/Pleura: Lungs are clear. No pulmonary nodule or mass. No consolidation. No pleural effusion or pneumothorax. Musculoskeletal: Dextroscoliosis of the thoracic spine, measuring approximately 25 degrees. Mild degenerative change within the thoracic spine. No acute or suspicious osseous finding. Superficial soft tissues are unremarkable. CT ABDOMEN PELVIS FINDINGS Hepatobiliary: Diffuse intrahepatic bile duct dilatation. Common bile duct distended to approximately 1.5 cm. No focal enhancing mass or lesion identified within the liver. Gallbladder is moderately distended but otherwise unremarkable. Pancreas: Pancreatic duct dilatation, dilated up to approximately 1.2 cm. Heterogeneity of the pancreatic head, ill-defined, almost certainly representing a pancreatic carcinoma. Spleen: Within normal limits in size and appearance. Adrenals/Urinary Tract: Adrenal glands appear normal. Kidneys appear normal without stone or hydronephrosis. Stomach/Bowel: Bowel is normal in caliber. No bowel wall thickening or evidence of bowel wall inflammation. Appendix is normal. Vascular/Lymphatic: Mildly prominent lymph nodes within the upper abdomen, including a 2 cm short axis lymph node in the portacaval space. No enlarged lymph nodes elsewhere in the abdomen or pelvis.  Mild atherosclerotic changes of the normal caliber abdominal aorta. Reproductive: No mass or other significant abnormality. Other: No free fluid or abscess collection  identified. No free intraperitoneal air. Musculoskeletal: Degenerative changes throughout the thoracolumbar spine, moderate in degree. No acute or suspicious osseous finding. Sclerotic lesion within the left iliac bone is most likely a benign bone island. Superficial soft tissues are unremarkable. IMPRESSION: 1. Heterogeneity of the pancreatic head, ill-defined limiting measurement, almost certainly representing a pancreatic carcinoma given the combination of bile duct dilatation and pancreatic duct dilatation. Recommend pancreas MRI for further characterization. 2. Mildly prominent lymph nodes within the upper abdomen, suspicious for local metastatic lymphadenopathy, particularly prominent lymph node in the portacaval space measuring 2 cm short axis dimension. No enlarged lymph nodes elsewhere in the abdomen or pelvis. No evidence of distant metastasis. 3. Normal chest CT. 4. Scoliosis. No evidence of osseous metastasis. Probable bone island within the left iliac bone. These results were called by telephone at the time of interpretation on 02/27/2016 at 7:15 pm to Dr. Quintella Reichert , who verbally acknowledged these results. Electronically Signed   By: Franki Cabot M.D.   On: 02/27/2016 19:23  Ct Abdomen Pelvis W Contrast  Result Date: 02/27/2016 CLINICAL DATA:  Weight loss EXAM: CT CHEST, ABDOMEN, AND PELVIS WITH CONTRAST TECHNIQUE: Multidetector CT imaging of the chest, abdomen and pelvis was performed following the standard protocol during bolus administration of intravenous contrast. CONTRAST:  147mL ISOVUE-300 IOPAMIDOL (ISOVUE-300) INJECTION 61% COMPARISON:  None. FINDINGS: CT CHEST FINDINGS Mediastinum/Lymph Nodes: Heart size is normal. No pericardial effusion. Thoracic aorta is normal in caliber and configuration. No mass or enlarged lymph nodes seen within the mediastinum or perihilar regions. Trachea and central bronchi are unremarkable. Lungs/Pleura: Lungs are clear. No pulmonary nodule or mass. No  consolidation. No pleural effusion or pneumothorax. Musculoskeletal: Dextroscoliosis of the thoracic spine, measuring approximately 25 degrees. Mild degenerative change within the thoracic spine. No acute or suspicious osseous finding. Superficial soft tissues are unremarkable. CT ABDOMEN PELVIS FINDINGS Hepatobiliary: Diffuse intrahepatic bile duct dilatation. Common bile duct distended to approximately 1.5 cm. No focal enhancing mass or lesion identified within the liver. Gallbladder is moderately distended but otherwise unremarkable. Pancreas: Pancreatic duct dilatation, dilated up to approximately 1.2 cm. Heterogeneity of the pancreatic head, ill-defined, almost certainly representing a pancreatic carcinoma. Spleen: Within normal limits in size and appearance. Adrenals/Urinary Tract: Adrenal glands appear normal. Kidneys appear normal without stone or hydronephrosis. Stomach/Bowel: Bowel is normal in caliber. No bowel wall thickening or evidence of bowel wall inflammation. Appendix is normal. Vascular/Lymphatic: Mildly prominent lymph nodes within the upper abdomen, including a 2 cm short axis lymph node in the portacaval space. No enlarged lymph nodes elsewhere in the abdomen or pelvis. Mild atherosclerotic changes of the normal caliber abdominal aorta. Reproductive: No mass or other significant abnormality. Other: No free fluid or abscess collection identified. No free intraperitoneal air. Musculoskeletal: Degenerative changes throughout the thoracolumbar spine, moderate in degree. No acute or suspicious osseous finding. Sclerotic lesion within the left iliac bone is most likely a benign bone island. Superficial soft tissues are unremarkable. IMPRESSION: 1. Heterogeneity of the pancreatic head, ill-defined limiting measurement, almost certainly representing a pancreatic carcinoma given the combination of bile duct dilatation and pancreatic duct dilatation. Recommend pancreas MRI for further characterization.  2. Mildly prominent lymph nodes within the upper abdomen, suspicious for local metastatic lymphadenopathy, particularly prominent lymph node in the portacaval space measuring 2 cm short axis dimension. No enlarged lymph nodes elsewhere in the  abdomen or pelvis. No evidence of distant metastasis. 3. Normal chest CT. 4. Scoliosis. No evidence of osseous metastasis. Probable bone island within the left iliac bone. These results were called by telephone at the time of interpretation on 02/27/2016 at 7:15 pm to Dr. Quintella Reichert , who verbally acknowledged these results. Electronically Signed   By: Franki Cabot M.D.   On: 02/27/2016 19:23      Assessment / Plan:   60 y/o male presenting with significant weight loss, fatigue, and painless jaundice. CT shows pancreatic head mass with associated dilation of CBD and pancreatic duct, most concerning for pancreatic cancer. CA 19-9 is markedly elevated. There is an enlarged lymph node in the area as well.   I have discussed differential with the patient and patient yesterday, and relayed my concerns for pancreatic malignancy causing obstructive jaundice. He is scheduled for an ERCP with Dr. Loletha Carrow for tomorrow, for biliary stent placement. With antibiotics yesterday he reports feeling improvement today in regards to appetite, perhaps he had a low level cholangitis with his mild leukocytosis. Pending he does well with ERCP, he can likely be discharged tomorrow or Thursday. We have him tentatively scheduled with Dr. Ardis Hughs for EUS next Thursday (August 10th) for EUS to clarify the diagnosis and help stage him for resectability.   I discussed both ERCP and EUS with him and outlined risks / benefits. He wished to proceed as planned. He can continue regular diet today and NPO after dinner for planned ERCP tomorrow. Continue to trend LAEs.  Please call with additional questions / concerns.   [place] Cellar, MD [facility] Pager [phone]

## 2016-03-01 ENCOUNTER — Inpatient Hospital Stay (HOSPITAL_COMMUNITY): Payer: 59 | Admitting: Anesthesiology

## 2016-03-01 ENCOUNTER — Inpatient Hospital Stay (HOSPITAL_COMMUNITY): Payer: 59

## 2016-03-01 ENCOUNTER — Encounter (HOSPITAL_COMMUNITY)
Admission: EM | Disposition: A | Discharge status: Home / Self Care | Payer: Self-pay | Source: Home / Self Care | Attending: Internal Medicine

## 2016-03-01 ENCOUNTER — Encounter (HOSPITAL_COMMUNITY): Payer: Self-pay | Admitting: Anesthesiology

## 2016-03-01 DIAGNOSIS — R17 Unspecified jaundice: Secondary | ICD-10-CM

## 2016-03-01 DIAGNOSIS — I10 Essential (primary) hypertension: Secondary | ICD-10-CM

## 2016-03-01 DIAGNOSIS — K831 Obstruction of bile duct: Principal | ICD-10-CM

## 2016-03-01 HISTORY — PX: ERCP: SHX5425

## 2016-03-01 LAB — COMPREHENSIVE METABOLIC PANEL
ALT: 224 U/L — ABNORMAL HIGH (ref 17–63)
AST: 137 U/L — ABNORMAL HIGH (ref 15–41)
Albumin: 2.5 g/dL — ABNORMAL LOW (ref 3.5–5.0)
Alkaline Phosphatase: 704 U/L — ABNORMAL HIGH (ref 38–126)
Anion gap: 10 (ref 5–15)
BUN: 13 mg/dL (ref 6–20)
CO2: 19 mmol/L — ABNORMAL LOW (ref 22–32)
Calcium: 8.7 mg/dL — ABNORMAL LOW (ref 8.9–10.3)
Chloride: 104 mmol/L (ref 101–111)
Creatinine, Ser: 0.41 mg/dL — ABNORMAL LOW (ref 0.61–1.24)
GFR calc Af Amer: >60 mL/min (ref >60)
GFR calc non Af Amer: >60 mL/min (ref >60)
Glucose, Bld: 216 mg/dL — ABNORMAL HIGH (ref 65–99)
Potassium: 3.7 mmol/L (ref 3.5–5.1)
Sodium: 133 mmol/L — ABNORMAL LOW (ref 135–145)
Total Bilirubin: 7.8 mg/dL — ABNORMAL HIGH (ref 0.3–1.2)
Total Protein: 6 g/dL — ABNORMAL LOW (ref 6.5–8.1)

## 2016-03-01 LAB — RETICULOCYTES
RBC.: 3.18 MIL/uL — ABNORMAL LOW (ref 4.22–5.81)
Retic Count, Absolute: 92.2 10*3/uL (ref 19.0–186.0)
Retic Ct Pct: 2.9 % (ref 0.4–3.1)

## 2016-03-01 LAB — IRON AND TIBC
Iron: 62 ug/dL (ref 45–182)
Saturation Ratios: 23 % (ref 17.9–39.5)
TIBC: 273 ug/dL (ref 250–450)
UIBC: 211 ug/dL

## 2016-03-01 LAB — VITAMIN B12: Vitamin B-12: 3010 pg/mL — ABNORMAL HIGH (ref 180–914)

## 2016-03-01 LAB — FERRITIN: Ferritin: 518 ng/mL — ABNORMAL HIGH (ref 24–336)

## 2016-03-01 LAB — GLUCOSE, CAPILLARY
Glucose-Capillary: 255 mg/dL — ABNORMAL HIGH (ref 65–99)
Glucose-Capillary: 232 mg/dL — ABNORMAL HIGH (ref 65–99)
Glucose-Capillary: 202 mg/dL — ABNORMAL HIGH (ref 65–99)
Glucose-Capillary: 180 mg/dL — ABNORMAL HIGH (ref 65–99)
Glucose-Capillary: 422 mg/dL — ABNORMAL HIGH (ref 65–99)
Glucose-Capillary: 382 mg/dL — ABNORMAL HIGH (ref 65–99)
Glucose-Capillary: 209 mg/dL — ABNORMAL HIGH (ref 65–99)
Glucose-Capillary: 305 mg/dL — ABNORMAL HIGH (ref 65–99)

## 2016-03-01 LAB — FOLATE: Folate: 22.5 ng/mL (ref >5.9)

## 2016-03-01 SURGERY — ERCP, WITH INTERVENTION IF INDICATED
Anesthesia: General

## 2016-03-01 MED ORDER — FENTANYL CITRATE (PF) 100 MCG/2ML IJ SOLN
INTRAMUSCULAR | Status: AC
  Filled 2016-03-01: qty 2

## 2016-03-01 MED ORDER — INSULIN GLARGINE 100 UNIT/ML ~~LOC~~ SOLN
12.0000 [IU] | Freq: Every day | SUBCUTANEOUS | Status: DC
  Administered 2016-03-01: 12 [IU] via SUBCUTANEOUS
  Filled 2016-03-01 (×2): qty 0.12

## 2016-03-01 MED ORDER — FENTANYL CITRATE (PF) 250 MCG/5ML IJ SOLN
INTRAMUSCULAR | Status: DC | PRN
  Administered 2016-03-01: 50 ug via INTRAVENOUS
  Administered 2016-03-01 (×2): 25 ug via INTRAVENOUS
  Administered 2016-03-01: 100 ug via INTRAVENOUS

## 2016-03-01 MED ORDER — SODIUM CHLORIDE 0.9 % IV SOLN
INTRAVENOUS | Status: DC | PRN
  Administered 2016-03-01: 40 mL

## 2016-03-01 MED ORDER — INDOMETHACIN 50 MG RE SUPP
RECTAL | Status: DC | PRN
  Administered 2016-03-01: 100 mg via RECTAL

## 2016-03-01 MED ORDER — GLUCAGON HCL RDNA (DIAGNOSTIC) 1 MG IJ SOLR
INTRAMUSCULAR | Status: AC
  Filled 2016-03-01: qty 1

## 2016-03-01 MED ORDER — BISACODYL 10 MG RE SUPP
10.0000 mg | Freq: Once | RECTAL | Status: AC
  Administered 2016-03-01: 10 mg via RECTAL
  Filled 2016-03-01: qty 1

## 2016-03-01 MED ORDER — INDOMETHACIN 50 MG RE SUPP
RECTAL | Status: AC
  Filled 2016-03-01: qty 1

## 2016-03-01 MED ORDER — AMLODIPINE BESYLATE 10 MG PO TABS
10.0000 mg | ORAL_TABLET | Freq: Every day | ORAL | Status: DC
  Administered 2016-03-01 – 2016-03-02 (×2): 10 mg via ORAL
  Filled 2016-03-01 (×2): qty 1

## 2016-03-01 MED ORDER — INSULIN ASPART 100 UNIT/ML ~~LOC~~ SOLN
20.0000 [IU] | Freq: Once | SUBCUTANEOUS | Status: AC
  Administered 2016-03-01: 20 [IU] via SUBCUTANEOUS

## 2016-03-01 MED ORDER — LACTATED RINGERS IV SOLN
INTRAVENOUS | Status: DC
  Administered 2016-03-01: 11:00:00 via INTRAVENOUS

## 2016-03-01 MED ORDER — BENAZEPRIL HCL 10 MG PO TABS
10.0000 mg | ORAL_TABLET | Freq: Every day | ORAL | Status: DC
  Administered 2016-03-01 – 2016-03-02 (×2): 10 mg via ORAL
  Filled 2016-03-01 (×2): qty 1

## 2016-03-01 MED ORDER — PHENYLEPHRINE 40 MCG/ML (10ML) SYRINGE FOR IV PUSH (FOR BLOOD PRESSURE SUPPORT)
PREFILLED_SYRINGE | INTRAVENOUS | Status: AC
  Filled 2016-03-01: qty 10

## 2016-03-01 MED ORDER — PROPOFOL 10 MG/ML IV BOLUS
INTRAVENOUS | Status: AC
  Filled 2016-03-01: qty 20

## 2016-03-01 MED ORDER — POLYETHYLENE GLYCOL 3350 17 G PO PACK
17.0000 g | PACK | Freq: Every day | ORAL | Status: DC
  Administered 2016-03-01: 17 g via ORAL
  Filled 2016-03-01 (×2): qty 1

## 2016-03-01 MED ORDER — SODIUM CHLORIDE 0.9 % IJ SOLN
INTRAMUSCULAR | Status: AC
  Filled 2016-03-01: qty 10

## 2016-03-01 MED ORDER — LIDOCAINE HCL (CARDIAC) 20 MG/ML IV SOLN
INTRAVENOUS | Status: DC | PRN
  Administered 2016-03-01: 100 mg via INTRAVENOUS

## 2016-03-01 MED ORDER — PROPOFOL 10 MG/ML IV BOLUS
INTRAVENOUS | Status: DC | PRN
  Administered 2016-03-01: 170 mg via INTRAVENOUS

## 2016-03-01 MED ORDER — LIDOCAINE HCL (CARDIAC) 20 MG/ML IV SOLN
INTRAVENOUS | Status: AC
  Filled 2016-03-01: qty 5

## 2016-03-01 MED ORDER — SUCCINYLCHOLINE CHLORIDE 20 MG/ML IJ SOLN
INTRAMUSCULAR | Status: DC | PRN
  Administered 2016-03-01: 100 mg via INTRAVENOUS

## 2016-03-01 MED ORDER — PHENYLEPHRINE HCL 10 MG/ML IJ SOLN
INTRAMUSCULAR | Status: DC | PRN
  Administered 2016-03-01 (×3): 80 ug via INTRAVENOUS

## 2016-03-01 MED ORDER — SODIUM CHLORIDE 0.9 % IV SOLN
1.5000 g | Freq: Four times a day (QID) | INTRAVENOUS | Status: AC
  Administered 2016-03-01 – 2016-03-02 (×4): 1.5 g via INTRAVENOUS
  Filled 2016-03-01 (×4): qty 1.5

## 2016-03-01 NOTE — H&P (View-Only) (Signed)
Progress Note   Subjective  Patient reports feeling improved appetite this AM and ate a good breakfast. He denies any significant abdominal pain. Afebrile. LAEs improved today. CA 19-9 returned quite elevated.    Objective   Vital signs in last 24 hours: Temp:  [97.8 F (36.6 C)-99.1 F (37.3 C)] 97.8 F (36.6 C) (08/01 0411) Pulse Rate:  [65-100] 82 (08/01 0411) Resp:  [16-18] 16 (08/01 0411) BP: (107-130)/(56-69) 127/69 (08/01 0411) SpO2:  [97 %-100 %] 100 % (08/01 0411) Last BM Date: 02/27/16 General:    White male in NAD, jaundiced Heart:  Regular rate and rhythm; no murmurs Lungs: Respirations even and unlabored, lungs CTA bilaterally Abdomen:  Soft, nontender and nondistended.  Extremities:  Without edema. Neurologic:  Alert and oriented,  grossly normal neurologically. Psych:  Cooperative. Normal mood and affect.  Intake/Output from previous day: 07/31 0701 - 08/01 0700 In: 4750.4 [P.O.:240; I.V.:4360.4; IV Piggyback:150] Out: [id number] [Urine:4050] Intake/Output this shift: Total I/O In: 240 [P.O.:240] Out: -   Lab Results:  Recent Labs  02/27/16 2100 02/28/16 0447 02/29/16 0504  WBC 11.1* 12.5* 8.2  HGB 12.0* 11.1* 10.4*  HCT 37.3* 34.4* 31.5*  PLT 401* 366 311   BMET  Recent Labs  02/28/16 2023 02/29/16 0029 02/29/16 0504  NA 129* 131* 131*  K 4.0 3.7 3.4*  CL 102 105 103  CO2 17* 16* 16*  GLUCOSE 307* 198* 181*  BUN 19 18 17   CREATININE 0.80 0.50* 0.61  CALCIUM 8.5* 8.5* 8.3*   LFT  Recent Labs  02/28/16 0447 02/29/16 0504  PROT 6.4* 6.0*  ALBUMIN 2.9* 2.6*  AST 142* 109*  ALT 272* 225*  ALKPHOS 683* 665*  BILITOT 11.0* 7.6*  BILIDIR 6.6*  --   IBILI 4.4*  --    PT/INR  Recent Labs  02/27/16 2100 02/28/16 0447  LABPROT 13.3 13.3  INR 1.01 1.01    Studies/Results: Ct Chest W Contrast  Result Date: 02/27/2016 CLINICAL DATA:  Weight loss EXAM: CT CHEST, ABDOMEN, AND PELVIS WITH CONTRAST TECHNIQUE: Multidetector CT  imaging of the chest, abdomen and pelvis was performed following the standard protocol during bolus administration of intravenous contrast. CONTRAST:  140mL ISOVUE-300 IOPAMIDOL (ISOVUE-300) INJECTION 61% COMPARISON:  None. FINDINGS: CT CHEST FINDINGS Mediastinum/Lymph Nodes: Heart size is normal. No pericardial effusion. Thoracic aorta is normal in caliber and configuration. No mass or enlarged lymph nodes seen within the mediastinum or perihilar regions. Trachea and central bronchi are unremarkable. Lungs/Pleura: Lungs are clear. No pulmonary nodule or mass. No consolidation. No pleural effusion or pneumothorax. Musculoskeletal: Dextroscoliosis of the thoracic spine, measuring approximately 25 degrees. Mild degenerative change within the thoracic spine. No acute or suspicious osseous finding. Superficial soft tissues are unremarkable. CT ABDOMEN PELVIS FINDINGS Hepatobiliary: Diffuse intrahepatic bile duct dilatation. Common bile duct distended to approximately 1.5 cm. No focal enhancing mass or lesion identified within the liver. Gallbladder is moderately distended but otherwise unremarkable. Pancreas: Pancreatic duct dilatation, dilated up to approximately 1.2 cm. Heterogeneity of the pancreatic head, ill-defined, almost certainly representing a pancreatic carcinoma. Spleen: Within normal limits in size and appearance. Adrenals/Urinary Tract: Adrenal glands appear normal. Kidneys appear normal without stone or hydronephrosis. Stomach/Bowel: Bowel is normal in caliber. No bowel wall thickening or evidence of bowel wall inflammation. Appendix is normal. Vascular/Lymphatic: Mildly prominent lymph nodes within the upper abdomen, including a 2 cm short axis lymph node in the portacaval space. No enlarged lymph nodes elsewhere in the abdomen or pelvis.  Mild atherosclerotic changes of the normal caliber abdominal aorta. Reproductive: No mass or other significant abnormality. Other: No free fluid or abscess collection  identified. No free intraperitoneal air. Musculoskeletal: Degenerative changes throughout the thoracolumbar spine, moderate in degree. No acute or suspicious osseous finding. Sclerotic lesion within the left iliac bone is most likely a benign bone island. Superficial soft tissues are unremarkable. IMPRESSION: 1. Heterogeneity of the pancreatic head, ill-defined limiting measurement, almost certainly representing a pancreatic carcinoma given the combination of bile duct dilatation and pancreatic duct dilatation. Recommend pancreas MRI for further characterization. 2. Mildly prominent lymph nodes within the upper abdomen, suspicious for local metastatic lymphadenopathy, particularly prominent lymph node in the portacaval space measuring 2 cm short axis dimension. No enlarged lymph nodes elsewhere in the abdomen or pelvis. No evidence of distant metastasis. 3. Normal chest CT. 4. Scoliosis. No evidence of osseous metastasis. Probable bone island within the left iliac bone. These results were called by telephone at the time of interpretation on 02/27/2016 at 7:15 pm to Dr. Quintella Reichert , who verbally acknowledged these results. Electronically Signed   By: Franki Cabot M.D.   On: 02/27/2016 19:23  Ct Abdomen Pelvis W Contrast  Result Date: 02/27/2016 CLINICAL DATA:  Weight loss EXAM: CT CHEST, ABDOMEN, AND PELVIS WITH CONTRAST TECHNIQUE: Multidetector CT imaging of the chest, abdomen and pelvis was performed following the standard protocol during bolus administration of intravenous contrast. CONTRAST:  147mL ISOVUE-300 IOPAMIDOL (ISOVUE-300) INJECTION 61% COMPARISON:  None. FINDINGS: CT CHEST FINDINGS Mediastinum/Lymph Nodes: Heart size is normal. No pericardial effusion. Thoracic aorta is normal in caliber and configuration. No mass or enlarged lymph nodes seen within the mediastinum or perihilar regions. Trachea and central bronchi are unremarkable. Lungs/Pleura: Lungs are clear. No pulmonary nodule or mass. No  consolidation. No pleural effusion or pneumothorax. Musculoskeletal: Dextroscoliosis of the thoracic spine, measuring approximately 25 degrees. Mild degenerative change within the thoracic spine. No acute or suspicious osseous finding. Superficial soft tissues are unremarkable. CT ABDOMEN PELVIS FINDINGS Hepatobiliary: Diffuse intrahepatic bile duct dilatation. Common bile duct distended to approximately 1.5 cm. No focal enhancing mass or lesion identified within the liver. Gallbladder is moderately distended but otherwise unremarkable. Pancreas: Pancreatic duct dilatation, dilated up to approximately 1.2 cm. Heterogeneity of the pancreatic head, ill-defined, almost certainly representing a pancreatic carcinoma. Spleen: Within normal limits in size and appearance. Adrenals/Urinary Tract: Adrenal glands appear normal. Kidneys appear normal without stone or hydronephrosis. Stomach/Bowel: Bowel is normal in caliber. No bowel wall thickening or evidence of bowel wall inflammation. Appendix is normal. Vascular/Lymphatic: Mildly prominent lymph nodes within the upper abdomen, including a 2 cm short axis lymph node in the portacaval space. No enlarged lymph nodes elsewhere in the abdomen or pelvis. Mild atherosclerotic changes of the normal caliber abdominal aorta. Reproductive: No mass or other significant abnormality. Other: No free fluid or abscess collection identified. No free intraperitoneal air. Musculoskeletal: Degenerative changes throughout the thoracolumbar spine, moderate in degree. No acute or suspicious osseous finding. Sclerotic lesion within the left iliac bone is most likely a benign bone island. Superficial soft tissues are unremarkable. IMPRESSION: 1. Heterogeneity of the pancreatic head, ill-defined limiting measurement, almost certainly representing a pancreatic carcinoma given the combination of bile duct dilatation and pancreatic duct dilatation. Recommend pancreas MRI for further characterization.  2. Mildly prominent lymph nodes within the upper abdomen, suspicious for local metastatic lymphadenopathy, particularly prominent lymph node in the portacaval space measuring 2 cm short axis dimension. No enlarged lymph nodes elsewhere in the  abdomen or pelvis. No evidence of distant metastasis. 3. Normal chest CT. 4. Scoliosis. No evidence of osseous metastasis. Probable bone island within the left iliac bone. These results were called by telephone at the time of interpretation on 02/27/2016 at 7:15 pm to Dr. Quintella Reichert , who verbally acknowledged these results. Electronically Signed   By: Franki Cabot M.D.   On: 02/27/2016 19:23      Assessment / Plan:   60 y/o male presenting with significant weight loss, fatigue, and painless jaundice. CT shows pancreatic head mass with associated dilation of CBD and pancreatic duct, most concerning for pancreatic cancer. CA 19-9 is markedly elevated. There is an enlarged lymph node in the area as well.   I have discussed differential with the patient and patient yesterday, and relayed my concerns for pancreatic malignancy causing obstructive jaundice. He is scheduled for an ERCP with Dr. Loletha Carrow for tomorrow, for biliary stent placement. With antibiotics yesterday he reports feeling improvement today in regards to appetite, perhaps he had a low level cholangitis with his mild leukocytosis. Pending he does well with ERCP, he can likely be discharged tomorrow or Thursday. We have him tentatively scheduled with Dr. Ardis Hughs for EUS next Thursday (August 10th) for EUS to clarify the diagnosis and help stage him for resectability.   I discussed both ERCP and EUS with him and outlined risks / benefits. He wished to proceed as planned. He can continue regular diet today and NPO after dinner for planned ERCP tomorrow. Continue to trend LAEs.  Please call with additional questions / concerns.   [place] Cellar, MD [facility] Pager [phone]

## 2016-03-01 NOTE — Progress Notes (Signed)
PROGRESS NOTE    Colton Bush  [id number] DOB: 07/02/1956 DOA: 02/27/2016  PCP: [name], MD   Brief Narrative:  61 y/o with 48 lb weight lossin 3 mo, fatigue, jaundice, GERD, Esophageal stricture, DM2. He presented to the ER for fatigue, weight loss and jaundince. His PCP had ordered CT of chest/abd/pelvis but he could not weight for this to be done as outpt. His scans in the ER showed bile duct obstruction due to a pancreatitic head mass.   Subjective: Eating well. No complaints.   Assessment & Plan:   Principal Problem:   Obstructive jaundice due to malignant neoplasm - partial obstruction - Labs on 03/01/2016 showing total bilirubin of 7.8 with ALT of 224, AST 137, alk phosphatase of 704 - CA- 19-9 very high-1497 - On 03/01/2016 he underwent ERCP, procedure performed by Dr. Loletha Carrow of gastroenterology undergoing stenting of common bile duct with plastic stent -High suspicion for pancreatic cancer. GI recommending he undergo EUS and placement of metal stent next week. GI also recommending continuing IV antibiotic therapy for the next 24 hours  Active Problems:   Diabetes Mellitus  - Blood sugars elevated, his diabetes been advanced today after undergoing procedure, was made nothing by mouth overnight. -Having hepatic impairment, plan to continue holding metformin and Glucotrol -Will place him on Lantus 12 units subcutaneous daily  Acidosis - non-anion gap- possibly starvation acidosis- lactic acid normal- not DKA  Hypokalemia - Potassium levels improved to 3.7 after receiving oral replacement    Hyperlipidemia, mixed - hold pravastatin due to liver impairment    Essential hypertension - Blood pressures elevated, will restart amlodipine and benazepril    GERD - PPI  DVT prophylaxis: lovenox Code Status: full code Family Communication: I spoke to his wife was present at bedside Disposition Plan: Anticipate discharge in the next 24-48 hours Consultants:    GI Procedures:   none Antimicrobials:  Anti-infectives    Start     Dose/Rate Route Frequency Ordered Stop   03/01/16 1400  ampicillin-sulbactam (UNASYN) 1.5 g in sodium chloride 0.9 % 50 mL IVPB     1.5 g 100 mL/hr over 30 Minutes Intravenous Every 6 hours 03/01/16 1341 03/02/16 1359   02/28/16 1400  ampicillin-sulbactam (UNASYN) 1.5 g in sodium chloride 0.9 % 50 mL IVPB  Status:  Discontinued     1.5 g 100 mL/hr over 30 Minutes Intravenous Every 6 hours 02/28/16 1300 03/01/16 1341       Objective: Vitals:   03/01/16 1042 03/01/16 1301 03/01/16 1330 03/01/16 1340  BP: (!) 149/79 (!) 166/89 (!) 170/95 (!) 168/97  Pulse: 76 90  89  Resp: 16 12  16   Temp: 98.4 F (36.9 C) 97.6 F (36.4 C)  98.5 F (36.9 C)  TempSrc: Oral Oral  Oral  SpO2: 98% 100%  98%  Weight: 79.4 kg (175 lb)     Height: 5' 10"  (1.778 m)       Intake/Output Summary (Last 24 hours) at 03/01/16 1442 Last data filed at 03/01/16 1355  Gross per 24 hour  Intake             1700 ml  Output             3375 ml  Net            -1675 ml   Filed Weights   02/27/16 1455 02/27/16 2105 03/01/16 1042  Weight: 83.9 kg (185 lb) 79.8 kg (175 lb 14.8 oz) 79.4 kg (175 lb)  Examination: General exam: Appears comfortable - jaundiced HEENT: PERRLA, oral mucosa moist, + sclera icterus - no thrush Respiratory system: Clear to auscultation. Respiratory effort normal. Cardiovascular system: S1 & S2 heard, RRR.  No murmurs  Gastrointestinal system: Abdomen soft, non-tender, nondistended. Normal bowel sound. No organomegaly Central nervous system: Alert and oriented. No focal neurological deficits. Extremities: No cyanosis, clubbing or edema Skin: No rashes or ulcers Psychiatry:  Mood & affect appropriate.     Data Reviewed: I have personally reviewed following labs and imaging studies  CBC:  Recent Labs Lab 02/27/16 1647 02/27/16 2100 02/28/16 0447 02/29/16 0504  WBC 12.0* 11.1* 12.5* 8.2  HGB 12.7*  12.0* 11.1* 10.4*  HCT 39.7 37.3* 34.4* 31.5*  MCV 105.6* 104.8* 104.9* 104.0*  PLT 449* 401* 366 400   Basic Metabolic Panel:  Recent Labs Lab 02/27/16 2100  02/28/16 1453 02/28/16 2023 02/29/16 0029 02/29/16 0504 03/01/16 0527  NA 132*  < > 133* 129* 131* 131* 133*  K 4.8  < > 3.8 4.0 3.7 3.4* 3.7  CL 102  < > 103 102 105 103 104  CO2 13*  < > 13* 17* 16* 16* 19*  GLUCOSE 147*  < > 230* 307* 198* 181* 216*  BUN 37*  < > 24* 19 18 17 13   CREATININE 0.80  < > 0.85 0.80 0.50* 0.61 0.41*  CALCIUM 9.1  < > 8.7* 8.5* 8.5* 8.3* 8.7*  MG 2.4  --   --   --   --   --   --   < > = values in this interval not displayed. GFR: Estimated Creatinine Clearance: 98.9 mL/min (by C-G formula based on SCr of 0.8 mg/dL). Liver Function Tests:  Recent Labs Lab 02/27/16 1647 02/28/16 0447 02/29/16 0504 03/01/16 0527  AST 159* 142* 109* 137*  ALT 336* 272* 225* 224*  ALKPHOS 824* 683* 665* 704*  BILITOT 11.2* 11.0* 7.6* 7.8*  PROT 7.5 6.4* 6.0* 6.0*  ALBUMIN 3.4* 2.9* 2.6* 2.5*    Recent Labs Lab 02/27/16 1647  LIPASE 107*   No results for input(s): AMMONIA in the last 168 hours. Coagulation Profile:  Recent Labs Lab 02/27/16 1647 02/27/16 2100 02/28/16 0447  INR 1.00 1.01 1.01   Cardiac Enzymes: No results for input(s): CKTOTAL, CKMB, CKMBINDEX, TROPONINI in the last 168 hours. BNP (last 3 results) No results for input(s): PROBNP in the last 8760 hours. HbA1C: No results for input(s): HGBA1C in the last 72 hours. CBG:  Recent Labs Lab 02/29/16 2030 03/01/16 0213 03/01/16 0408 03/01/16 0734 03/01/16 1340  GLUCAP 323* 255* 232* 202* 180*   Lipid Profile: No results for input(s): CHOL, HDL, LDLCALC, TRIG, CHOLHDL, LDLDIRECT in the last 72 hours. Thyroid Function Tests: No results for input(s): TSH, T4TOTAL, FREET4, T3FREE, THYROIDAB in the last 72 hours. Anemia Panel:  Recent Labs  03/01/16 0527  VITAMINB12 3,010*  FOLATE 22.5  FERRITIN 518*  TIBC 273   IRON 62  RETICCTPCT 2.9   Urine analysis:    Component Value Date/Time   COLORURINE AMBER (A) 02/28/2016 0009   APPEARANCEUR CLEAR 02/28/2016 0009   LABSPEC >1.046 (H) 02/28/2016 0009   PHURINE 5.0 02/28/2016 0009   GLUCOSEU >1000 (A) 02/28/2016 0009   HGBUR NEGATIVE 02/28/2016 0009   BILIRUBINUR MODERATE (A) 02/28/2016 0009   BILIRUBINUR 1+ 02/23/2016 1256   KETONESUR >80 (A) 02/28/2016 0009   PROTEINUR NEGATIVE 02/28/2016 0009   UROBILINOGEN 1.0 02/23/2016 1256   NITRITE NEGATIVE 02/28/2016 0009   LEUKOCYTESUR NEGATIVE  02/28/2016 0009   Sepsis Labs: @LABRCNTIP (procalcitonin:4,lacticidven:4) ) Recent Results (from the past 240 hour(s))  Urine culture     Status: Abnormal   Collection Time: 02/28/16 12:09 AM  Result Value Ref Range Status   Specimen Description URINE, CLEAN CATCH  Final   Special Requests NONE  Final   Culture (A)  Final    <10,000 COLONIES/mL INSIGNIFICANT GROWTH Performed at [facility]    Report Status 02/29/2016 FINAL  Final         Radiology Studies: Dg Ercp With Sphincterotomy  Result Date: 03/01/2016 CLINICAL DATA:  Jaundice, biliary ductal dilatation and pancreatic ductal dilatation on recent CT. EXAM: ERCP TECHNIQUE: Multiple spot images obtained with the fluoroscopic device and submitted for interpretation post-procedure. FLUOROSCOPY TIME:  3.58 minutes, 58 mGy COMPARISON:  CT 02/27/2016 FINDINGS: A series of 5 fluoroscopic spot images document endoscopic catheterization and opacification of the biliary tree. There is irregular narrowing in the distal CBD. There is dilatation of the proximal CBD. The intrahepatic biliary tree is incompletely opacified, appearing dilated centrally. Subsequent images document placement of a plastic endoscopic stent across the distal CBD lesion. IMPRESSION: 1. Distal CBD stenotic lesion with proximal dilatation, and subsequent endoscopic stent placement. These images were submitted for radiologic  interpretation only. Please see the procedural report for the amount of contrast and the fluoroscopy time utilized. Electronically Signed   By: Lucrezia Europe M.D.   On: 03/01/2016 13:34      Scheduled Meds: . ampicillin-sulbactam (UNASYN) IV  1.5 g Intravenous Q6H  . antiseptic oral rinse  7 mL Mouth Rinse q12n4p  . chlorhexidine  15 mL Mouth Rinse BID  . docusate sodium  100 mg Oral BID  . feeding supplement (GLUCERNA SHAKE)  237 mL Oral TID BM  . insulin aspart  0-5 Units Subcutaneous QHS  . insulin aspart  0-9 Units Subcutaneous TID WC  . multivitamin with minerals  1 tablet Oral Daily  . pantoprazole  40 mg Oral Daily  . polyethylene glycol  17 g Oral Daily   Continuous Infusions:     LOS: 3 days    Time spent in minutes: 35    Namish Krise, MD [facility] Pager: [email or web address] Password [email or web address] 03/01/2016, 2:42 PM

## 2016-03-01 NOTE — Progress Notes (Signed)
Inpatient Diabetes Program Recommendations  AACE/ADA: New Consensus Statement on Inpatient Glycemic Control (2015)  Target Ranges:  Prepandial:   less than 140 mg/dL      Peak postprandial:   less than 180 mg/dL (1-2 hours)      Critically ill patients:  140 - 180 mg/dL   Lab Results  Component Value Date   GLUCAP 202 (H) 03/01/2016   HGBA1C 7.9 (H) 02/07/2016   Results for HERU, GUNZENHAUSER (MRN [id number]) as of 03/01/2016 09:44  Ref. Range 02/29/2016 16:07 02/29/2016 20:30 03/01/2016 02:13 03/01/2016 04:08 03/01/2016 07:34  Glucose-Capillary Latest Ref Range: 65 - 99 mg/dL 237 (H) 323 (H) 255 (H) 232 (H) 202 (H)   Review of Glycemic Control Blood sugars continue to be elevated. Needs insulin adjustment. NPO  Inpatient Diabetes Program Recommendations:    Change Novolog to moderate Q4H while NPO. Add small amount of basal insulin -  Lantus 12 units QHS  Will continue to follow. Thank you. Lorenda Peck, RD, LDN, CDE Inpatient Diabetes Coordinator [phone]

## 2016-03-01 NOTE — Anesthesia Procedure Notes (Signed)
Procedure Name: Intubation Date/Time: 03/01/2016 11:54 AM Performed by: Danley Danker L Patient Re-evaluated:Patient Re-evaluated prior to inductionOxygen Delivery Method: Circle system utilized Preoxygenation: Pre-oxygenation with 100% oxygen Intubation Type: IV induction, Rapid sequence and Cricoid Pressure applied Laryngoscope Size: Miller and 3 Grade View: Grade I Tube type: Oral Tube size: 7.5 mm Number of attempts: 1 Airway Equipment and Method: Stylet Placement Confirmation: ETT inserted through vocal cords under direct vision,  positive ETCO2 and breath sounds checked- equal and bilateral Secured at: 23 cm Tube secured with: Tape Dental Injury: Teeth and Oropharynx as per pre-operative assessment

## 2016-03-01 NOTE — Progress Notes (Signed)
PT Cancellation Note  Patient Details Name: Colton Bush MRN: [id number] DOB: [birth year]   Cancelled Treatment:    Reason Eval/Treat Not Completed: Patient had a  procedure /ERCP today. Check back tomorrow.  Claretha Cooper 03/01/2016, 4:28 PM Tresa Endo PT [phone]

## 2016-03-01 NOTE — Anesthesia Preprocedure Evaluation (Addendum)
Anesthesia Evaluation  Patient identified by MRN, date of birth, ID band Patient awake, Patient confused and Patient unresponsive    Reviewed: Allergy & Precautions, NPO status , Patient's Chart, lab work & pertinent test results  Airway Mallampati: II  TM Distance: >3 FB Neck ROM: Full    Dental  (+) Edentulous Upper, Partial Lower, Dental Advisory Given   Pulmonary neg pulmonary ROS,    Pulmonary exam normal breath sounds clear to auscultation       Cardiovascular hypertension, Pt. on medications Normal cardiovascular exam Rhythm:Regular Rate:Normal     Neuro/Psych PSYCHIATRIC DISORDERS Depression Neck injury in the past. Good ROM neck now. May have some weakness in both shoulders.  Neuromuscular disease    GI/Hepatic hiatal hernia, GERD  Medicated,Jaundice Pancreatic mass   Endo/Other  diabetes, Type 2, Oral Hypoglycemic Agents  Renal/GU negative Renal ROS  negative genitourinary   Musculoskeletal  (+) Arthritis ,   Abdominal   Peds negative pediatric ROS (+)  Hematology negative hematology ROS (+)   Anesthesia Other Findings   Reproductive/Obstetrics negative OB ROS                           Anesthesia Physical Anesthesia Plan  ASA: III  Anesthesia Plan: General   Post-op Pain Management:    Induction: Intravenous  Airway Management Planned: Oral ETT  Additional Equipment:   Intra-op Plan:   Post-operative Plan: Extubation in OR  Informed Consent: I have reviewed the patients History and Physical, chart, labs and discussed the procedure including the risks, benefits and alternatives for the proposed anesthesia with the patient or authorized representative who has indicated his/her understanding and acceptance.   Dental advisory given  Plan Discussed with: CRNA  Anesthesia Plan Comments:        Anesthesia Quick Evaluation

## 2016-03-01 NOTE — Transfer of Care (Signed)
Immediate Anesthesia Transfer of Care Note  Patient: Colton Bush  Procedure(s) Performed: Procedure(s): ENDOSCOPIC RETROGRADE CHOLANGIOPANCREATOGRAPHY (ERCP) with brushings and stent (N/A)  Patient Location: Endoscopy Unit  Anesthesia Type:General  Level of Consciousness: awake, alert  and oriented  Airway & Oxygen Therapy: Patient Spontanous Breathing and Patient connected to face mask oxygen  Post-op Assessment: Report given to RN and Post -op Vital signs reviewed and stable  Post vital signs: Reviewed and stable  Last Vitals:  Vitals:   03/01/16 0415 03/01/16 1042  BP: 117/85 (!) 149/79  Pulse: 65 76  Resp: 18 16  Temp: 36.8 C 36.9 C    Last Pain:  Vitals:   03/01/16 1042  TempSrc: Oral  PainSc:       Patients Stated Pain Goal: 0 ([id number])  Complications: No apparent anesthesia complications

## 2016-03-01 NOTE — Telephone Encounter (Signed)
The pt has been set up for EUS for 03/09/16 730 am [facility], Sharee Pimple was called and notified to change the procedure to outpatient.  She states she can not change it until after the pt is released from the hospital.  Instructions have been added to [facility] and are available via My Chart.

## 2016-03-01 NOTE — Interval H&P Note (Signed)
History and Physical Interval Note:  03/01/2016 11:14 AM  Colton Bush  has presented today for surgery, with the diagnosis of jaundice, bile duct obstruction/pancreatic mass  The various methods of treatment have been discussed with the patient and family. After consideration of risks, benefits and other options for treatment, the patient has consented to  Procedure(s): ENDOSCOPIC RETROGRADE CHOLANGIOPANCREATOGRAPHY (ERCP) with brushings and stent (N/A) as a surgical intervention .  The patient's history has been reviewed, patient examined, no change in status, stable for surgery.  I have reviewed the patient's chart and labs.  Questions were answered to the patient's satisfaction.     Nelida Meuse III

## 2016-03-01 NOTE — Telephone Encounter (Signed)
I spoke with Colton Bush and she will notify the pt of the appt and instructions, I will also call on Monday to confirm and answer any questions.

## 2016-03-01 NOTE — Progress Notes (Signed)
Patient ID: Colton Bush, male   DOB: 02-06-1956, 60 y.o.   MRN: [id number]    Progress Note   Subjective    feels tired and hungry, no pain...says whatever this is he is going to come back fighting..  Long discussion with wife in hallway prior to seeing pt- multiple questions answered  [id number]     Objective   Vital signs in last 24 hours: Temp:  [98.1 F (36.7 C)-99 F (37.2 C)] 98.2 F (36.8 C) (08/02 0415) Pulse Rate:  [65-84] 65 (08/02 0415) Resp:  [16-18] 18 (08/02 0415) BP: (117-127)/(70-85) 117/85 (08/02 0415) SpO2:  [97 %-98 %] 98 % (08/02 0415) Last BM Date: 02/27/16 General:  WM in NAD, jaundiced Heart:  Regular rate and rhythm; no murmurs Lungs: Respirations even and unlabored, lungs CTA bilaterally Abdomen:  Soft, nontender and nondistended. Normal bowel sounds. Extremities:  Without edema. Neurologic:  Alert and oriented,  grossly normal neurologically. Psych:  Cooperative. Normal mood and affect.  Intake/Output from previous day: 08/01 0701 - 08/02 0700 In: 2025.8 [P.O.:1080; I.V.:845.8; IV Piggyback:100] Out: [id number] [Urine:3175] Intake/Output this shift: Total I/O In: 50 [IV Piggyback:50] Out: 800 [Urine:800]  Lab Results:  Recent Labs  02/27/16 2100 02/28/16 0447 02/29/16 0504  WBC 11.1* 12.5* 8.2  HGB 12.0* 11.1* 10.4*  HCT 37.3* 34.4* 31.5*  PLT 401* 366 311   BMET  Recent Labs  02/29/16 0029 02/29/16 0504 03/01/16 0527  NA 131* 131* 133*  K 3.7 3.4* 3.7  CL 105 103 104  CO2 16* 16* 19*  GLUCOSE 198* 181* 216*  BUN 18 17 13   CREATININE 0.50* 0.61 0.41*  CALCIUM 8.5* 8.3* 8.7*   LFT  Recent Labs  02/28/16 0447  03/01/16 0527  PROT 6.4*  < > 6.0*  ALBUMIN 2.9*  < > 2.5*  AST 142*  < > 137*  ALT 272*  < > 224*  ALKPHOS 683*  < > 704*  BILITOT 11.0*  < > 7.8*  BILIDIR 6.6*  --   --   IBILI 4.4*  --   --   < > = values in this interval not displayed. PT/INR  Recent Labs  02/27/16 2100 02/28/16 0447  LABPROT  13.3 13.3  INR 1.01 1.01    Studies/Results: No results found.     Assessment / Plan:    #1 60 yo WM with CBD obstruction/jaundice secondary to pancreatic mass .  He is scheduled for ERCP /Stent and brushings today with Dr Loletha Carrow On Unasyn Stopped Lovenox until post procedures Hopefully he can be discharged in next 48 hours, then have scheduled for EUS with Dr Ardis Hughs next week on 8/10 Need to set up Oncology appt  ASAP therafter  Principal Problem:   Obstructive jaundice due to malignant neoplasm Active Problems:   Diabetes ([place])   Hyperlipidemia, mixed   Essential hypertension   GERD   Diabetes mellitus type 2, controlled ([place])   DKA (diabetic ketoacidoses) ([place])   Biliary obstruction     LOS: 3 days   Tanise Russman  03/01/2016, 9:08 AM

## 2016-03-01 NOTE — Op Note (Signed)
[facility] Patient Name: Colton Bush Procedure Date: 03/01/2016 MRN: [id number] Attending MD: Estill Cotta. Loletha Carrow , MD Date of Birth: 05/03/1956 CSN: [id number] Age: 60 Admit Type: Inpatient Procedure:                ERCP Indications:              Jaundice, Tumor of the head of pancreas Providers:                Mallie Mussel L. Loletha Carrow, MD, Cleda Daub, RN, Hilma Favors, RN, Cherylynn Ridges, Technician, Elspeth Cho Tech., Technician Referring MD:              Medicines:                General Anesthesia, Indomethacin 100 mg PR, Unasyn                            1.5 g IV Complications:            No immediate complications. Estimated Blood Loss:     Estimated blood loss: none. Procedure:                Pre-Anesthesia Assessment:                           - Prior to the procedure, a History and Physical                            was performed, and patient medications and                            allergies were reviewed. The patient's tolerance of                            previous anesthesia was also reviewed. The risks                            and benefits of the procedure and the sedation                            options and risks were discussed with the patient.                            All questions were answered, and informed consent                            was obtained. Prior Anticoagulants: The patient has                            taken no previous anticoagulant or antiplatelet  agents. ASA Grade Assessment: III - A patient with                            severe systemic disease. After reviewing the risks                            and benefits, the patient was deemed in                            satisfactory condition to undergo the procedure.                           After obtaining informed consent, the scope was                            passed under direct vision.  Throughout the                            procedure, the patient's blood pressure, pulse, and                            oxygen saturations were monitored continuously. The                            [id number]) scope was introduced through                            the mouth, and used to inject contrast into and                            used to inject contrast into the bile duct. The                            ERCP was accomplished without difficulty. The                            patient tolerated the procedure well. Scope In: Scope Out: Findings:      The scout film was normal. The esophagus was successfully intubated       under direct vision. The scope was advanced to a normal major papilla       (albeit with a separate biliary/pancreatic punctum) in the descending       duodenum without detailed examination of the pharynx, larynx and       associated structures, and upper GI tract. The upper GI tract was       grossly normal. There was copious bile in the UGI tract, and bile coming       from the major papilla. The bile duct was deeply cannulated with the       traction (standard) sphincterotome. Contrast was injected. I personally       interpreted the bile duct images. There was brisk flow of contrast       through the ducts. Image quality was excellent. Contrast extended to the       hepatic ducts. The middle third of the main bile duct  contained a single       localized stenosis 20 mm in length. The upper third of the main bile       duct, common hepatic duct, left main hepatic duct and right main hepatic       duct were dilated. 0.035 inch x 260 cm straight Hydra Jagwire passed       successfully into the left intrahepatic branches. Cells for cytology       were obtained by brushing. One 10 Fr by 7 cm plastic stent with a single       external flap and a single internal flap was placed 6 cm into the common       bile duct. Bile flowed through the stent. The stent  was in good position. Impression:               - A localized biliary stricture was found.                           - The upper third of the main bile duct, left main                            hepatic duct, right main hepatic duct and common                            hepatic duct were dilated.                           - One plastic stent was placed into the common bile                            duct. Moderate Sedation:      GETA Recommendation:           - EUS and metal stent placement scheduled for next                            week.                           Continue antibiotics until tomorrow AM. Procedure Code(s):        --- Professional ---                           [phone], Endoscopic retrograde                            cholangiopancreatography (ERCP); with placement of                            endoscopic stent into biliary or pancreatic duct,                            including pre- and post-dilation and guide wire                            passage, when performed, including sphincterotomy,  when performed, each stent Diagnosis Code(s):        --- Professional ---                           K83.1, Obstruction of bile duct                           R17, Unspecified jaundice                           D49.0, Neoplasm of unspecified behavior of                            digestive system                           K83.8, Other specified diseases of biliary tract CPT copyright 2016 [employer]. All rights reserved. The codes documented in this report are preliminary and upon coder review may  be revised to meet current compliance requirements. Alzina Golda L. Loletha Carrow, MD 03/01/2016 1:01:03 PM This report has been signed electronically. Number of Addenda: 0

## 2016-03-01 NOTE — Anesthesia Postprocedure Evaluation (Signed)
Anesthesia Post Note  Patient: Colton Bush  Procedure(s) Performed: Procedure(s) (LRB): ENDOSCOPIC RETROGRADE CHOLANGIOPANCREATOGRAPHY (ERCP) with brushings and stent (N/A)  Patient location during evaluation: PACU Anesthesia Type: General Level of consciousness: awake and alert Pain management: pain level controlled Vital Signs Assessment: post-procedure vital signs reviewed and stable Respiratory status: spontaneous breathing, nonlabored ventilation, respiratory function stable and patient connected to nasal cannula oxygen Cardiovascular status: blood pressure returned to baseline and stable Postop Assessment: no signs of nausea or vomiting Anesthetic complications: no    Last Vitals:  Vitals:   03/01/16 1330 03/01/16 1340  BP: (!) 170/95 (!) 168/97  Pulse:  89  Resp:  16  Temp:  36.9 C    Last Pain:  Vitals:   03/01/16 1340  TempSrc: Oral  PainSc:                  [name] J

## 2016-03-02 ENCOUNTER — Telehealth: Payer: Self-pay | Admitting: Family Medicine

## 2016-03-02 ENCOUNTER — Encounter (HOSPITAL_COMMUNITY): Payer: Self-pay | Admitting: Gastroenterology

## 2016-03-02 DIAGNOSIS — K869 Disease of pancreas, unspecified: Secondary | ICD-10-CM

## 2016-03-02 LAB — COMPREHENSIVE METABOLIC PANEL
ALT: 168 U/L — ABNORMAL HIGH (ref 17–63)
AST: 44 U/L — ABNORMAL HIGH (ref 15–41)
Albumin: 2.5 g/dL — ABNORMAL LOW (ref 3.5–5.0)
Alkaline Phosphatase: 631 U/L — ABNORMAL HIGH (ref 38–126)
Anion gap: 7 (ref 5–15)
BUN: 15 mg/dL (ref 6–20)
CO2: 26 mmol/L (ref 22–32)
Calcium: 8.6 mg/dL — ABNORMAL LOW (ref 8.9–10.3)
Chloride: 100 mmol/L — ABNORMAL LOW (ref 101–111)
Creatinine, Ser: <0.3 mg/dL — ABNORMAL LOW (ref 0.61–1.24)
Glucose, Bld: 180 mg/dL — ABNORMAL HIGH (ref 65–99)
Potassium: 3.2 mmol/L — ABNORMAL LOW (ref 3.5–5.1)
Sodium: 133 mmol/L — ABNORMAL LOW (ref 135–145)
Total Bilirubin: 3.8 mg/dL — ABNORMAL HIGH (ref 0.3–1.2)
Total Protein: 6.1 g/dL — ABNORMAL LOW (ref 6.5–8.1)

## 2016-03-02 LAB — CBC
HCT: 33.7 % — ABNORMAL LOW (ref 39.0–52.0)
Hemoglobin: 11.1 g/dL — ABNORMAL LOW (ref 13.0–17.0)
MCH: 33.6 pg (ref 26.0–34.0)
MCHC: 32.9 g/dL (ref 30.0–36.0)
MCV: 102.1 fL — ABNORMAL HIGH (ref 78.0–100.0)
Platelets: 347 10*3/uL (ref 150–400)
RBC: 3.3 MIL/uL — ABNORMAL LOW (ref 4.22–5.81)
RDW: 14.4 % (ref 11.5–15.5)
WBC: 5 10*3/uL (ref 4.0–10.5)

## 2016-03-02 LAB — GLUCOSE, CAPILLARY
Glucose-Capillary: 249 mg/dL — ABNORMAL HIGH (ref 65–99)
Glucose-Capillary: 275 mg/dL — ABNORMAL HIGH (ref 65–99)

## 2016-03-02 MED ORDER — INSULIN ASPART 100 UNIT/ML ~~LOC~~ SOLN
5.0000 [IU] | Freq: Three times a day (TID) | SUBCUTANEOUS | Status: DC
  Administered 2016-03-02 (×2): 5 [IU] via SUBCUTANEOUS

## 2016-03-02 MED ORDER — AMLODIPINE BESYLATE 10 MG PO TABS: 10.0000 mg | ORAL_TABLET | Freq: Every day | ORAL | 1 refills | Status: DC

## 2016-03-02 MED ORDER — DOCUSATE SODIUM 100 MG PO CAPS: 100.0000 mg | ORAL_CAPSULE | Freq: Two times a day (BID) | ORAL | 0 refills | Status: DC

## 2016-03-02 MED ORDER — INSULIN GLARGINE 100 UNIT/ML ~~LOC~~ SOLN
20.0000 [IU] | Freq: Every day | SUBCUTANEOUS | Status: DC
  Administered 2016-03-02: 20 [IU] via SUBCUTANEOUS
  Filled 2016-03-02: qty 0.2

## 2016-03-02 MED ORDER — OXYCODONE HCL 5 MG PO TABS: 5.0000 mg | ORAL_TABLET | ORAL | 0 refills | Status: DC | PRN

## 2016-03-02 MED ORDER — BENAZEPRIL HCL 10 MG PO TABS: 10.0000 mg | ORAL_TABLET | Freq: Every day | ORAL | 1 refills | Status: DC

## 2016-03-02 MED ORDER — POTASSIUM CHLORIDE CRYS ER 20 MEQ PO TBCR
40.0000 meq | EXTENDED_RELEASE_TABLET | Freq: Once | ORAL | Status: AC
  Administered 2016-03-02: 40 meq via ORAL
  Filled 2016-03-02: qty 2

## 2016-03-02 MED ORDER — POLYETHYLENE GLYCOL 3350 17 G PO PACK: 17.0000 g | PACK | Freq: Every day | ORAL | 0 refills | Status: DC | PRN

## 2016-03-02 MED ORDER — INSULIN GLARGINE 100 UNIT/ML ~~LOC~~ SOLN: 20.0000 [IU] | Freq: Every day | SUBCUTANEOUS | 11 refills | Status: DC

## 2016-03-02 NOTE — Progress Notes (Signed)
Patient ID: Colton Bush, male   DOB: 05/06/1956, 60 y.o.   MRN: [id number]    Progress Note   Subjective   Feels good, had regular breakfast- no c/o abdominal pain or nausea    Objective   Vital signs in last 24 hours: Temp:  [97.6 F (36.4 C)-98.5 F (36.9 C)] 97.9 F (36.6 C) (08/03 0452) Pulse Rate:  [64-90] 64 (08/03 0452) Resp:  [12-18] 18 (08/03 0452) BP: (143-170)/(69-97) 143/71 (08/03 0452) SpO2:  [98 %-100 %] 99 % (08/03 0452) Weight:  [175 lb (79.4 kg)] 175 lb (79.4 kg) (08/02 1042) Last BM Date: 03/01/16 General:    White male in NAD- less jaundiced Heart:  Regular rate and rhythm; no murmurs Lungs: Respirations even and unlabored, lungs CTA bilaterally Abdomen:  Soft, nontender and nondistended. Normal bowel sounds. Extremities:  Without edema. Neurologic:  Alert and oriented,  grossly normal neurologically. Psych:  Cooperative. Normal mood and affect.  Intake/Output from previous day: 08/02 0701 - 08/03 0700 In: 1100 [I.V.:900; IV Piggyback:200] Out: 800 [Urine:800] Intake/Output this shift: Total I/O In: 240 [P.O.:240] Out: -   Lab Results:  Recent Labs  02/29/16 0504 03/02/16 0521  WBC 8.2 5.0  HGB 10.4* 11.1*  HCT 31.5* 33.7*  PLT 311 347   BMET  Recent Labs  02/29/16 0504 03/01/16 0527 03/02/16 0521  NA 131* 133* 133*  K 3.4* 3.7 3.2*  CL 103 104 100*  CO2 16* 19* 26  GLUCOSE 181* 216* 180*  BUN 17 13 15   CREATININE 0.61 0.41* <0.30*  CALCIUM 8.3* 8.7* 8.6*   LFT  Recent Labs  03/02/16 0521  PROT 6.1*  ALBUMIN 2.5*  AST 44*  ALT 168*  ALKPHOS 631*  BILITOT 3.8*   PT/INR No results for input(s): LABPROT, INR in the last 72 hours.  Studies/Results: Dg Ercp With Sphincterotomy  Result Date: 03/01/2016 CLINICAL DATA:  Jaundice, biliary ductal dilatation and pancreatic ductal dilatation on recent CT. EXAM: ERCP TECHNIQUE: Multiple spot images obtained with the fluoroscopic device and submitted for interpretation  post-procedure. FLUOROSCOPY TIME:  3.58 minutes, 58 mGy COMPARISON:  CT 02/27/2016 FINDINGS: A series of 5 fluoroscopic spot images document endoscopic catheterization and opacification of the biliary tree. There is irregular narrowing in the distal CBD. There is dilatation of the proximal CBD. The intrahepatic biliary tree is incompletely opacified, appearing dilated centrally. Subsequent images document placement of a plastic endoscopic stent across the distal CBD lesion. IMPRESSION: 1. Distal CBD stenotic lesion with proximal dilatation, and subsequent endoscopic stent placement. These images were submitted for radiologic interpretation only. Please see the procedural report for the amount of contrast and the fluoroscopy time utilized. Electronically Signed   By: Lucrezia Europe M.D.   On: 03/01/2016 13:34       Assessment / Plan:    #1  60 yo WM  With biliary obstruction secondary to pancreatic head mass - he is stable s/p ERCP/stent and brushings of mid CBD stricture yesterday - bili coming down.  Plan is for discharge today, await brushings Can stop antibiotics He is scheduled for EUS/bx ,prob metal stent next week with Dr Ardis Hughs on 8/10  At 7:30 am at [facility] We will arrange for Oncology consultation ASAP  Principal Problem:   Obstructive jaundice due to malignant neoplasm Active Problems:   Diabetes ([place])   Hyperlipidemia, mixed   Essential hypertension   GERD   Diabetes mellitus type 2, controlled ([place])   DKA (diabetic ketoacidoses) ([place])   Biliary obstruction  LOS: 4 days   Colton Bush  03/02/2016, 9:49 AM

## 2016-03-02 NOTE — Discharge Summary (Signed)
Physician Discharge Summary  Colton Bush [id number] DOB: 1956/04/20 DOA: 02/27/2016  PCP: [name], MD  Admit date: 02/27/2016 Discharge date: 03/02/2016  Time spent: 35 minutes  Recommendations for Outpatient Follow-up:  1. Colton Bush will be scheduled for EUS and biliary stent placement in 1 week with Dr. Ardis Hughs of gastroenterology. During this hospitalization he was diagnosed with pancreatic mass highly concerning for pancreatic carcinoma. 2. Please follow-up on blood sugars, he was discharged on Lantus 20 units subcutaneous daily, oral hypoglycemics discontinued due to developing liver failure. He was instructed to check his blood sugars 3 times daily and report these values to his primary care physician.   Discharge Diagnoses:  Principal Problem:   Obstructive jaundice due to malignant neoplasm Active Problems:   Diabetes ([place])   Hyperlipidemia, mixed   Essential hypertension   GERD   Diabetes mellitus type 2, controlled ([place])   DKA (diabetic ketoacidoses) ([place])   Biliary obstruction   Discharge Condition: Stable  Diet recommendation: Heart healthy  Filed Weights   02/27/16 1455 02/27/16 2105 03/01/16 1042  Weight: 83.9 kg (185 lb) 79.8 kg (175 lb 14.8 oz) 79.4 kg (175 lb)    History of present illness:   Colton Bush is a 60 y.o. male with medical history including but not limited diabetes mellitus, GERD and esophageal stricture.  He presented to the emergency room with 3 month history of progressively worsening weight loss, generalized fatigue, decreased appetite, nausea and abnormal skin discoloration as well as mild dyspnea on exertion over the last 5 days without fever or chills. He had been started on a new medication for diabetes about 3 months earlier and wonders if his symptoms may be related to medication.  Hospital Course:  Colton Bush is a 60 year old gentleman with a history of diabetes mellitus, presented with complaints of 40 pound weight loss over the  past 3 months associate with generalized weakness, fatigue, having functional decline. He had a recent CT scan of abdomen and pelvis performed on admission that revealed a mass at head of pancreas, concerning for pancreatic carcinoma. Labs revealed elevated total bilirubin of 11.0 with alkaline phosphatase of 683, AST of 142 and ALT of 272. Of note CT did not reveal evidence of metastatic disease. GI was consulted laboratory clinical evidence of obstructive biliary disease. He was treated with empiric IV Unasyn. On 03/01/2016 he underwent ERCP and found to have biliary stricture associate with common bile duct, left main hepatic duct and right main hepatic duct dilatated. GI placed stent. He tolerated procedure well there no immediate complications. Lab work showed improvement to his total bilirubin coming down to 3.8 by 03/02/2016. By this date he was doing well, ambulating down the hallway without assistive devices, felt ready to go home. GI felt he was stable for discharge as well. GI plans to bring him back in 1 week for endoscopic ultrasound and placement of metal stent. Has upcoming appointment with Dr. Ardis Hughs.  Procedures:  ERCP Impression:       - A localized biliary stricture was found.                           - The upper third of the main bile duct, left main                            hepatic duct, right main hepatic duct and common  hepatic duct were dilated.                           - One plastic stent was placed into the common bile                            duct.  Consultations:  GI  Discharge Exam: Vitals:   03/01/16 2115 03/02/16 0452  BP: (!) 154/69 (!) 143/71  Pulse: 84 64  Resp: 18 18  Temp: 98 F (36.7 C) 97.9 F (36.6 C)    General: No acute distress, looks better, sitting at bedside chair, improvement in jaundice Cardiovascular: Regular rate and rhythm normal S1-S2 Respiratory: Normal respiratory effort lungs are clear Abdomen: Soft  nontender nondistended  Discharge Instructions   Discharge Instructions    Call MD for:    Complete by:  As directed   Call MD for:  difficulty breathing, headache or visual disturbances    Complete by:  As directed   Call MD for:  extreme fatigue    Complete by:  As directed   Call MD for:  hives    Complete by:  As directed   Call MD for:  persistant dizziness or light-headedness    Complete by:  As directed   Call MD for:  persistant nausea and vomiting    Complete by:  As directed   Call MD for:  redness, tenderness, or signs of infection (pain, swelling, redness, odor or green/yellow discharge around incision site)    Complete by:  As directed   Call MD for:  severe uncontrolled pain    Complete by:  As directed   Call MD for:  temperature >100.4    Complete by:  As directed   Diet - low sodium heart healthy    Complete by:  As directed   Increase activity slowly    Complete by:  As directed     Current Discharge Medication List    START taking these medications   Details  amLODipine (NORVASC) 10 MG tablet Take 1 tablet (10 mg total) by mouth daily. Qty: 30 tablet, Refills: 1    benazepril (LOTENSIN) 10 MG tablet Take 1 tablet (10 mg total) by mouth daily. Qty: 30 tablet, Refills: 1    docusate sodium (COLACE) 100 MG capsule Take 1 capsule (100 mg total) by mouth 2 (two) times daily. Qty: 10 capsule, Refills: 0    insulin glargine (LANTUS) 100 UNIT/ML injection Inject 0.2 mLs (20 Units total) into the skin daily. Qty: 10 mL, Refills: 11    oxyCODONE (OXY IR/ROXICODONE) 5 MG immediate release tablet Take 1 tablet (5 mg total) by mouth every 4 (four) hours as needed for moderate pain. Qty: 10 tablet, Refills: 0    polyethylene glycol (MIRALAX / GLYCOLAX) packet Take 17 g by mouth daily as needed for mild constipation. Qty: 14 each, Refills: 0      CONTINUE these medications which have NOT CHANGED   Details  Multiple Vitamin (MULTIVITAMIN WITH MINERALS) TABS tablet Take  1 tablet by mouth daily.    omega-3 acid ethyl esters (LOVAZA) 1 g capsule Take 2 g by mouth daily.    omeprazole (PRILOSEC) 20 MG capsule Take 20 mg by mouth daily.      STOP taking these medications     amLODipine-benazepril (LOTREL) 10-20 MG capsule      aspirin EC 81 MG tablet  Dulaglutide (TRULICITY) A999333 0000000 SOPN      empagliflozin (JARDIANCE) 25 MG TABS tablet      glipiZIDE (GLUCOTROL XL) 10 MG 24 hr tablet      metFORMIN (GLUCOPHAGE-XR) 750 MG 24 hr tablet      pravastatin (PRAVACHOL) 40 MG tablet        No Known Allergies Follow-up Information    FRY,STEPHEN A, MD Follow up in 1 week(s).   Specialty:  Family Medicine Contact information: [place] [address] [phone]        Milus Banister, MD Follow up in 2 week(s).   Specialty:  Gastroenterology Contact information: [address] [place] [address] [phone]            The results of significant diagnostics from this hospitalization (including imaging, microbiology, ancillary and laboratory) are listed below for reference.    Significant Diagnostic Studies: Ct Chest W Contrast  Result Date: 02/27/2016 CLINICAL DATA:  Weight loss EXAM: CT CHEST, ABDOMEN, AND PELVIS WITH CONTRAST TECHNIQUE: Multidetector CT imaging of the chest, abdomen and pelvis was performed following the standard protocol during bolus administration of intravenous contrast. CONTRAST:  156mL ISOVUE-300 IOPAMIDOL (ISOVUE-300) INJECTION 61% COMPARISON:  None. FINDINGS: CT CHEST FINDINGS Mediastinum/Lymph Nodes: Heart size is normal. No pericardial effusion. Thoracic aorta is normal in caliber and configuration. No mass or enlarged lymph nodes seen within the mediastinum or perihilar regions. Trachea and central bronchi are unremarkable. Lungs/Pleura: Lungs are clear. No pulmonary nodule or mass. No consolidation. No pleural effusion or pneumothorax. Musculoskeletal: Dextroscoliosis of the  thoracic spine, measuring approximately 25 degrees. Mild degenerative change within the thoracic spine. No acute or suspicious osseous finding. Superficial soft tissues are unremarkable. CT ABDOMEN PELVIS FINDINGS Hepatobiliary: Diffuse intrahepatic bile duct dilatation. Common bile duct distended to approximately 1.5 cm. No focal enhancing mass or lesion identified within the liver. Gallbladder is moderately distended but otherwise unremarkable. Pancreas: Pancreatic duct dilatation, dilated up to approximately 1.2 cm. Heterogeneity of the pancreatic head, ill-defined, almost certainly representing a pancreatic carcinoma. Spleen: Within normal limits in size and appearance. Adrenals/Urinary Tract: Adrenal glands appear normal. Kidneys appear normal without stone or hydronephrosis. Stomach/Bowel: Bowel is normal in caliber. No bowel wall thickening or evidence of bowel wall inflammation. Appendix is normal. Vascular/Lymphatic: Mildly prominent lymph nodes within the upper abdomen, including a 2 cm short axis lymph node in the portacaval space. No enlarged lymph nodes elsewhere in the abdomen or pelvis. Mild atherosclerotic changes of the normal caliber abdominal aorta. Reproductive: No mass or other significant abnormality. Other: No free fluid or abscess collection identified. No free intraperitoneal air. Musculoskeletal: Degenerative changes throughout the thoracolumbar spine, moderate in degree. No acute or suspicious osseous finding. Sclerotic lesion within the left iliac bone is most likely a benign bone island. Superficial soft tissues are unremarkable. IMPRESSION: 1. Heterogeneity of the pancreatic head, ill-defined limiting measurement, almost certainly representing a pancreatic carcinoma given the combination of bile duct dilatation and pancreatic duct dilatation. Recommend pancreas MRI for further characterization. 2. Mildly prominent lymph nodes within the upper abdomen, suspicious for local metastatic  lymphadenopathy, particularly prominent lymph node in the portacaval space measuring 2 cm short axis dimension. No enlarged lymph nodes elsewhere in the abdomen or pelvis. No evidence of distant metastasis. 3. Normal chest CT. 4. Scoliosis. No evidence of osseous metastasis. Probable bone island within the left iliac bone. These results were called by telephone at the time of interpretation on 02/27/2016 at 7:15 pm to  Dr. Quintella Reichert , who verbally acknowledged these results. Electronically Signed   By: Franki Cabot M.D.   On: 02/27/2016 19:23  Ct Abdomen Pelvis W Contrast  Result Date: 02/27/2016 CLINICAL DATA:  Weight loss EXAM: CT CHEST, ABDOMEN, AND PELVIS WITH CONTRAST TECHNIQUE: Multidetector CT imaging of the chest, abdomen and pelvis was performed following the standard protocol during bolus administration of intravenous contrast. CONTRAST:  153mL ISOVUE-300 IOPAMIDOL (ISOVUE-300) INJECTION 61% COMPARISON:  None. FINDINGS: CT CHEST FINDINGS Mediastinum/Lymph Nodes: Heart size is normal. No pericardial effusion. Thoracic aorta is normal in caliber and configuration. No mass or enlarged lymph nodes seen within the mediastinum or perihilar regions. Trachea and central bronchi are unremarkable. Lungs/Pleura: Lungs are clear. No pulmonary nodule or mass. No consolidation. No pleural effusion or pneumothorax. Musculoskeletal: Dextroscoliosis of the thoracic spine, measuring approximately 25 degrees. Mild degenerative change within the thoracic spine. No acute or suspicious osseous finding. Superficial soft tissues are unremarkable. CT ABDOMEN PELVIS FINDINGS Hepatobiliary: Diffuse intrahepatic bile duct dilatation. Common bile duct distended to approximately 1.5 cm. No focal enhancing mass or lesion identified within the liver. Gallbladder is moderately distended but otherwise unremarkable. Pancreas: Pancreatic duct dilatation, dilated up to approximately 1.2 cm. Heterogeneity of the pancreatic head,  ill-defined, almost certainly representing a pancreatic carcinoma. Spleen: Within normal limits in size and appearance. Adrenals/Urinary Tract: Adrenal glands appear normal. Kidneys appear normal without stone or hydronephrosis. Stomach/Bowel: Bowel is normal in caliber. No bowel wall thickening or evidence of bowel wall inflammation. Appendix is normal. Vascular/Lymphatic: Mildly prominent lymph nodes within the upper abdomen, including a 2 cm short axis lymph node in the portacaval space. No enlarged lymph nodes elsewhere in the abdomen or pelvis. Mild atherosclerotic changes of the normal caliber abdominal aorta. Reproductive: No mass or other significant abnormality. Other: No free fluid or abscess collection identified. No free intraperitoneal air. Musculoskeletal: Degenerative changes throughout the thoracolumbar spine, moderate in degree. No acute or suspicious osseous finding. Sclerotic lesion within the left iliac bone is most likely a benign bone island. Superficial soft tissues are unremarkable. IMPRESSION: 1. Heterogeneity of the pancreatic head, ill-defined limiting measurement, almost certainly representing a pancreatic carcinoma given the combination of bile duct dilatation and pancreatic duct dilatation. Recommend pancreas MRI for further characterization. 2. Mildly prominent lymph nodes within the upper abdomen, suspicious for local metastatic lymphadenopathy, particularly prominent lymph node in the portacaval space measuring 2 cm short axis dimension. No enlarged lymph nodes elsewhere in the abdomen or pelvis. No evidence of distant metastasis. 3. Normal chest CT. 4. Scoliosis. No evidence of osseous metastasis. Probable bone island within the left iliac bone. These results were called by telephone at the time of interpretation on 02/27/2016 at 7:15 pm to Dr. Quintella Reichert , who verbally acknowledged these results. Electronically Signed   By: Franki Cabot M.D.   On: 02/27/2016 19:23  Dg Ercp  With Sphincterotomy  Result Date: 03/01/2016 CLINICAL DATA:  Jaundice, biliary ductal dilatation and pancreatic ductal dilatation on recent CT. EXAM: ERCP TECHNIQUE: Multiple spot images obtained with the fluoroscopic device and submitted for interpretation post-procedure. FLUOROSCOPY TIME:  3.58 minutes, 58 mGy COMPARISON:  CT 02/27/2016 FINDINGS: A series of 5 fluoroscopic spot images document endoscopic catheterization and opacification of the biliary tree. There is irregular narrowing in the distal CBD. There is dilatation of the proximal CBD. The intrahepatic biliary tree is incompletely opacified, appearing dilated centrally. Subsequent images document placement of a plastic endoscopic stent across the distal CBD lesion. IMPRESSION: 1. Distal  CBD stenotic lesion with proximal dilatation, and subsequent endoscopic stent placement. These images were submitted for radiologic interpretation only. Please see the procedural report for the amount of contrast and the fluoroscopy time utilized. Electronically Signed   By: Lucrezia Europe M.D.   On: 03/01/2016 13:34    Microbiology: Recent Results (from the past 240 hour(s))  Urine culture     Status: Abnormal   Collection Time: 02/28/16 12:09 AM  Result Value Ref Range Status   Specimen Description URINE, CLEAN CATCH  Final   Special Requests NONE  Final   Culture (A)  Final    <10,000 COLONIES/mL INSIGNIFICANT GROWTH Performed at [facility]    Report Status 02/29/2016 FINAL  Final     Labs: Basic Metabolic Panel:  Recent Labs Lab 02/27/16 2100  02/28/16 2023 02/29/16 0029 02/29/16 0504 03/01/16 0527 03/02/16 0521  NA 132*  < > 129* 131* 131* 133* 133*  K 4.8  < > 4.0 3.7 3.4* 3.7 3.2*  CL 102  < > 102 105 103 104 100*  CO2 13*  < > 17* 16* 16* 19* 26  GLUCOSE 147*  < > 307* 198* 181* 216* 180*  BUN 37*  < > 19 18 17 13 15   CREATININE 0.80  < > 0.80 0.50* 0.61 0.41* <0.30*  CALCIUM 9.1  < > 8.5* 8.5* 8.3* 8.7* 8.6*  MG 2.4  --    --   --   --   --   --   < > = values in this interval not displayed. Liver Function Tests:  Recent Labs Lab 02/27/16 1647 02/28/16 0447 02/29/16 0504 03/01/16 0527 03/02/16 0521  AST 159* 142* 109* 137* 44*  ALT 336* 272* 225* 224* 168*  ALKPHOS 824* 683* 665* 704* 631*  BILITOT 11.2* 11.0* 7.6* 7.8* 3.8*  PROT 7.5 6.4* 6.0* 6.0* 6.1*  ALBUMIN 3.4* 2.9* 2.6* 2.5* 2.5*    Recent Labs Lab 02/27/16 1647  LIPASE 107*   No results for input(s): AMMONIA in the last 168 hours. CBC:  Recent Labs Lab 02/27/16 1647 02/27/16 2100 02/28/16 0447 02/29/16 0504 03/02/16 0521  WBC 12.0* 11.1* 12.5* 8.2 5.0  HGB 12.7* 12.0* 11.1* 10.4* 11.1*  HCT 39.7 37.3* 34.4* 31.5* 33.7*  MCV 105.6* 104.8* 104.9* 104.0* 102.1*  PLT 449* 401* 366 311 347   Cardiac Enzymes: No results for input(s): CKTOTAL, CKMB, CKMBINDEX, TROPONINI in the last 168 hours. BNP: BNP (last 3 results) No results for input(s): BNP in the last 8760 hours.  ProBNP (last 3 results) No results for input(s): PROBNP in the last 8760 hours.  CBG:  Recent Labs Lab 03/01/16 1647 03/01/16 1818 03/01/16 2003 03/01/16 2215 03/02/16 0752  GLUCAP 422* 382* 209* 305* 249*       Signed:  Kelvin Cellar MD.  [facility] 03/02/2016, 12:10 PM

## 2016-03-02 NOTE — Evaluation (Signed)
Physical Therapy Evaluation Patient Details Name: Colton Bush MRN: [id number] DOB: 1956-03-16 Today's Date: 03/02/2016   History of Present Illness  60 yo WM  With biliary obstruction secondary to pancreatic head mass admitted 7/30. H/O cervical spine fracture  Clinical Impression  The patient is anxious to return home. He did demonstrate  Mild imbalance but the wife reports that he always walks weaving. He has a H/O cervical fracture/  The [name]. He declined HHPT but  PT encouraged it. No further PT due to DC.  Follow Up Recommendations Home health PT;Supervision - Intermittent (patient declines the need.)    Equipment Recommendations  None recommended by PT    Recommendations for Other Services       Precautions / Restrictions Precautions Precautions: Fall Restrictions Weight Bearing Restrictions: No Other Position/Activity Restrictions: 60 yo WM  With biliary obstruction secondary to pancreatic head mass  He presented to the emergency room with 3 month history of progressively worsening weight loss, generalized fatigue, decreased appetite, nausea and abnormal skin discoloration as well as mild dyspnea on exertion      Mobility  Bed Mobility Overal bed mobility: Independent                Transfers Overall transfer level: Needs assistance Equipment used: None Transfers: Sit to/from Stand Sit to Stand: Supervision         General transfer comment: cues for safety  Ambulation/Gait Ambulation/Gait assistance: Min guard Ambulation Distance (Feet): 420 Feet Assistive device: 1 person hand held assist;None Gait Pattern/deviations: Step-through pattern;Drifts right/left     General Gait Details: 1 HHA utilized x 300', then no HHA, noted to sway, and stagger. Wife reports that this is how patient ambulated PTA" Not much different"  Stairs            Wheelchair Mobility    Modified Rankin (Stroke Patients Only)       Balance Overall  balance assessment: Needs assistance Sitting-balance support: Feet supported;No upper extremity supported Sitting balance-[name]: Normal     Standing balance support: During functional activity;No upper extremity supported Standing balance-[name]: Poor                               Pertinent Vitals/Pain Pain Assessment: No/denies pain    Home Living Family/patient expects to be discharged to:: Private residence Living Arrangements: Spouse/significant other Available Help at Discharge: Available PRN/intermittently Type of Home: House Home Access: Stairs to enter Entrance Stairs-Rails: None   Home Layout: One level   Additional Comments: wife works    Prior Function Level of Independence: Independent         Comments: wants to work     Journalist, newspaper        Extremity/Trunk Assessment   Upper Extremity Assessment: Generalized weakness           Lower Extremity Assessment: Generalized weakness      Cervical / Trunk Assessment: Kyphotic  Communication      Cognition Arousal/Alertness: Awake/alert Behavior During Therapy: WFL for tasks assessed/performed Overall Cognitive Status: Difficult to assess Area of Impairment: Safety/judgement     Memory: Decreased recall of precautions         General Comments: patient did not acknowledge that he was imbalanced    General Comments      Exercises        Assessment/Plan    PT Assessment All further PT needs can be  met in the next venue of care  PT Diagnosis Generalized weakness;Abnormality of gait   PT Problem List Decreased balance;Decreased safety awareness  PT Treatment Interventions     PT Goals (Current goals can be found in the Care Plan section) Acute Rehab PT Goals Patient Stated Goal: to go home today PT Goal Formulation: All assessment and education complete, DC therapy    Frequency     Barriers to discharge        Co-evaluation               End of  Session Equipment Utilized During Treatment: Gait belt Activity Tolerance: Patient tolerated treatment well Patient left: in chair;with call bell/phone within reach;with chair alarm set;with family/visitor present Nurse Communication: Mobility status         Time: 1566-4830 PT Time Calculation (min) (ACUTE ONLY): 23 min   Charges:   PT Evaluation $PT Eval Low Complexity: 1 Procedure PT Treatments $Gait Training: 8-22 mins   PT G Codes:        Claretha Cooper 03/02/2016, 12:32 PM Tresa Endo PT [phone]

## 2016-03-02 NOTE — Telephone Encounter (Signed)
° °  [facility] call to say pt is in hospital and will be canceling his scans that were ordered by Dr Sarajane Jews

## 2016-03-03 ENCOUNTER — Telehealth: Payer: Self-pay

## 2016-03-03 ENCOUNTER — Telehealth: Payer: Self-pay | Admitting: Family Medicine

## 2016-03-03 ENCOUNTER — Other Ambulatory Visit: Payer: Self-pay

## 2016-03-03 ENCOUNTER — Inpatient Hospital Stay: Admission: RE | Admit: 2016-03-03 | Payer: Self-pay | Source: Ambulatory Visit

## 2016-03-03 DIAGNOSIS — C259 Malignant neoplasm of pancreas, unspecified: Secondary | ICD-10-CM

## 2016-03-03 NOTE — Telephone Encounter (Signed)
-----   Message from Alfredia Ferguson, PA-C sent at 03/03/2016  1:55 PM EDT ----- Regarding: referral to oncology Annisa Mazzarella,  Please get this pt an appt with Oncology /Dr Benay Spice for initial consult for pancreatic cancer  with bile duct obstruction- hopefully for the week of August 14 th.. He is scheduled for EUS with Dr Ardis Hughs on 8/10 .Marland Kitchen Thank you

## 2016-03-03 NOTE — Telephone Encounter (Signed)
Call from team health- Mr. Tortoriello was released from the hospital yesterday after admission for FTT and jaundice. Found to have likely pancreatic cancer- secondary to elevated LFTS his oral diabetes meds and trulicity were stopped, he was discharged with an rx for lantus. However they were not able to get this from the pharmacy as it is not covered by [place] and is $400.  Plan to check at the [facility] office tomorrow am at sat clinic and see if there are any samples to cover him until a prior auth can be made; they can come by and pick it up. Will check and call in the am.  Wife states understanding.  They will have him avoid excess sugar intake and drink water for today- it is nearly 9pm so there is not a lot we can do to get his insulin covered right now [phone] [address] [place] [employer]

## 2016-03-04 ENCOUNTER — Other Ambulatory Visit: Payer: Self-pay | Admitting: Family Medicine

## 2016-03-04 ENCOUNTER — Ambulatory Visit: Payer: 59

## 2016-03-04 MED ORDER — INSULIN PEN NEEDLE 30G X 5 MM MISC: 2 refills | Status: DC

## 2016-03-04 NOTE — Telephone Encounter (Signed)
Found lantus solstar pens at the [place] office (sat clinic).  His wife [name] will come and pick them up.

## 2016-03-06 ENCOUNTER — Telehealth: Payer: Self-pay | Admitting: Emergency Medicine

## 2016-03-06 ENCOUNTER — Encounter (HOSPITAL_COMMUNITY): Payer: Self-pay | Admitting: *Deleted

## 2016-03-06 MED ORDER — INSULIN DETEMIR 100 UNIT/ML FLEXPEN: PEN_INJECTOR | SUBCUTANEOUS | 3 refills | Status: DC

## 2016-03-06 NOTE — Telephone Encounter (Signed)
Rx sent in

## 2016-03-06 NOTE — Telephone Encounter (Signed)
Please find out if his insurance will cover Levemir.

## 2016-03-06 NOTE — Telephone Encounter (Signed)
I spoke with pt and insurance will cover Levemir, pt uses pens and 20 units once a day, send a 90 day supply to [facility].

## 2016-03-06 NOTE — Telephone Encounter (Signed)
Please Advise.     PLEASE NOTE: All timestamps contained within this report are represented as [place] Standard Time. CONFIDENTIALTY NOTICE: This fax transmission is intended only for the addressee. It contains information that is legally privileged, confidential or otherwise protected from use or disclosure. If you are not the intended recipient, you are strictly prohibited from reviewing, disclosing, copying using or disseminating any of this information or taking any action in reliance on or regarding this information. If you have received this fax in error, please notify us immediately by telephone so that we can arrange for its return to [place]. Phone: [phone], Toll-Free: [phone], Fax: [phone] Page: 1 of 2 Call Id: [id number] [facility] - Client [place] Patient Name: Colton Bush Gender: Male DOB: Mar 08, 1956 Age: 60 Y 26 M 18 D Return Phone Number: [phone] (Primary), [phone] (Secondary) Address: City/State/Zip: [place] Client [facility] - Client Client Site [place] - Night Physician Alysia Penna - MD Contact Type Call Who Is Calling Patient / Member / Family / Caregiver Call Type Triage / Clinical Caller Name Tyland Reagle Relationship To Patient Spouse Return Phone Number [phone] (Primary) Chief Complaint Prescription Refill or Medication Request (non symptomatic) Reason for Call Symptomatic / Request for [place] Husband 's meds cost 400.00 and insurance is not paying for it. No symptoms, he was on the pills and the hosp changed the meds , meds Questions , when he was in hosp the last 5 days he was on other BS meds Translation No Nurse Assessment Nurse: Laurena Bering, RN, Helene Kelp Date/Time (Eastern Time): 03/03/2016 8:03:10 PM Confirm and document reason for call. If symptomatic, describe symptoms. You  must click the next button to save text entered. ---Caller states that her husband was in the hospital and blood sugar was elevated. MD stopped Trucility once a week and 2-3 pills for blood sugar. MD put pt. on Lantus. Pharmacy was suppose to contact office but failed to call MD office. The cost of Lantus is 400.00 dollars Has the patient traveled out of the country within the last 30 days? ---Not Applicable Does the patient have any new or worsening symptoms? ---No Please document clinical information provided and list any resource used. ---MD in hospital changed medications and patient can't afford Lantus Guidelines Guideline Title Affirmed Question Affirmed Notes Nurse Date/Time ([place] Time) Disp. Time Eilene Ghazi Time) Disposition Final User 03/03/2016 8:11:00 PM Called On-Call Provider Laurena Bering, RN, Helene Kelp 03/03/2016 8:11:41 PM Clinical Call Yes Laurena Bering, RN, Loretha Brasil NOTE: All timestamps contained within this report are represented as [place] Standard Time. CONFIDENTIALTY NOTICE: This fax transmission is intended only for the addressee. It contains information that is legally privileged, confidential or otherwise protected from use or disclosure. If you are not the intended recipient, you are strictly prohibited from reviewing, disclosing, copying using or disseminating any of this information or taking any action in reliance on or regarding this information. If you have received this fax in error, please notify us immediately by telephone so that we can arrange for its return to [place]. Phone: [phone], Toll-Free: [phone], Fax: [phone] Page: 2 of 2 Call Id: [id number] [place] [facility] DateTime Result/Outcome Message Type Notes Lamar Blinks MD [id number] 03/03/2016 8:11:00 PM Called On Call Provider - Reached Doctor Paged Lamar Blinks- MD 03/03/2016 8:11:31 PM Spoke with On Call - General Message Result Gave report to MD. MD will contact caller

## 2016-03-06 NOTE — Telephone Encounter (Signed)
Change from Lantus to Levemir to inject 29 units each evening, 90 day supply with 3 rf

## 2016-03-07 ENCOUNTER — Other Ambulatory Visit: Payer: Self-pay | Admitting: Endocrinology

## 2016-03-09 ENCOUNTER — Ambulatory Visit (HOSPITAL_COMMUNITY): Payer: 59 | Admitting: Anesthesiology

## 2016-03-09 ENCOUNTER — Telehealth: Payer: Self-pay

## 2016-03-09 ENCOUNTER — Encounter (HOSPITAL_COMMUNITY)
Admission: RE | Disposition: A | Discharge status: Home / Self Care | Payer: Self-pay | Source: Ambulatory Visit | Attending: Gastroenterology

## 2016-03-09 ENCOUNTER — Ambulatory Visit (HOSPITAL_COMMUNITY)
Admission: RE | Admit: 2016-03-09 | Discharge: 2016-03-09 | Disposition: A | Discharge status: Home / Self Care | Payer: 59 | Source: Ambulatory Visit | Attending: Gastroenterology | Admitting: Gastroenterology

## 2016-03-09 ENCOUNTER — Encounter (HOSPITAL_COMMUNITY): Payer: Self-pay

## 2016-03-09 ENCOUNTER — Telehealth: Payer: Self-pay | Admitting: *Deleted

## 2016-03-09 DIAGNOSIS — Z79899 Other long term (current) drug therapy: Secondary | ICD-10-CM | POA: Insufficient documentation

## 2016-03-09 DIAGNOSIS — K8689 Other specified diseases of pancreas: Secondary | ICD-10-CM

## 2016-03-09 DIAGNOSIS — C250 Malignant neoplasm of head of pancreas: Secondary | ICD-10-CM | POA: Insufficient documentation

## 2016-03-09 DIAGNOSIS — E119 Type 2 diabetes mellitus without complications: Secondary | ICD-10-CM | POA: Insufficient documentation

## 2016-03-09 DIAGNOSIS — K219 Gastro-esophageal reflux disease without esophagitis: Secondary | ICD-10-CM | POA: Insufficient documentation

## 2016-03-09 DIAGNOSIS — I10 Essential (primary) hypertension: Secondary | ICD-10-CM | POA: Diagnosis not present

## 2016-03-09 DIAGNOSIS — Z794 Long term (current) use of insulin: Secondary | ICD-10-CM | POA: Insufficient documentation

## 2016-03-09 DIAGNOSIS — E782 Mixed hyperlipidemia: Secondary | ICD-10-CM | POA: Insufficient documentation

## 2016-03-09 DIAGNOSIS — K869 Disease of pancreas, unspecified: Secondary | ICD-10-CM | POA: Diagnosis present

## 2016-03-09 DIAGNOSIS — C259 Malignant neoplasm of pancreas, unspecified: Secondary | ICD-10-CM

## 2016-03-09 DIAGNOSIS — R933 Abnormal findings on diagnostic imaging of other parts of digestive tract: Secondary | ICD-10-CM | POA: Diagnosis not present

## 2016-03-09 DIAGNOSIS — C801 Malignant (primary) neoplasm, unspecified: Secondary | ICD-10-CM

## 2016-03-09 HISTORY — DX: Malignant (primary) neoplasm, unspecified: C80.1

## 2016-03-09 HISTORY — PX: EUS: SHX5427

## 2016-03-09 LAB — GLUCOSE, CAPILLARY: Glucose-Capillary: 296 mg/dL — ABNORMAL HIGH (ref 65–99)

## 2016-03-09 SURGERY — ESOPHAGEAL ENDOSCOPIC ULTRASOUND (EUS) RADIAL
Anesthesia: Monitor Anesthesia Care

## 2016-03-09 MED ORDER — PROPOFOL 10 MG/ML IV BOLUS
INTRAVENOUS | Status: AC
  Filled 2016-03-09: qty 40

## 2016-03-09 MED ORDER — PROPOFOL 10 MG/ML IV BOLUS
INTRAVENOUS | Status: AC
Start: 2016-03-09 — End: 2016-03-09
  Filled 2016-03-09: qty 20

## 2016-03-09 MED ORDER — PROPOFOL 10 MG/ML IV BOLUS
INTRAVENOUS | Status: DC | PRN
  Administered 2016-03-09: 10 mg via INTRAVENOUS
  Administered 2016-03-09 (×2): 20 mg via INTRAVENOUS
  Administered 2016-03-09 (×4): 10 mg via INTRAVENOUS
  Administered 2016-03-09 (×3): 20 mg via INTRAVENOUS
  Administered 2016-03-09 (×5): 10 mg via INTRAVENOUS
  Administered 2016-03-09: 40 mg via INTRAVENOUS
  Administered 2016-03-09 (×4): 10 mg via INTRAVENOUS
  Administered 2016-03-09 (×4): 20 mg via INTRAVENOUS
  Administered 2016-03-09: 10 mg via INTRAVENOUS
  Administered 2016-03-09: 20 mg via INTRAVENOUS
  Administered 2016-03-09: 10 mg via INTRAVENOUS
  Administered 2016-03-09: 20 mg via INTRAVENOUS
  Administered 2016-03-09: 10 mg via INTRAVENOUS
  Administered 2016-03-09: 20 mg via INTRAVENOUS
  Administered 2016-03-09 (×4): 10 mg via INTRAVENOUS
  Administered 2016-03-09: 20 mg via INTRAVENOUS
  Administered 2016-03-09: 10 mg via INTRAVENOUS
  Administered 2016-03-09: 20 mg via INTRAVENOUS
  Administered 2016-03-09 (×3): 10 mg via INTRAVENOUS
  Administered 2016-03-09: 20 mg via INTRAVENOUS
  Administered 2016-03-09: 10 mg via INTRAVENOUS
  Administered 2016-03-09: 20 mg via INTRAVENOUS
  Administered 2016-03-09 (×2): 10 mg via INTRAVENOUS

## 2016-03-09 MED ORDER — PHENYLEPHRINE HCL 10 MG/ML IJ SOLN
INTRAMUSCULAR | Status: DC | PRN
  Administered 2016-03-09 (×3): 80 ug via INTRAVENOUS

## 2016-03-09 MED ORDER — LACTATED RINGERS IV SOLN
INTRAVENOUS | Status: DC | PRN
  Administered 2016-03-09: 07:00:00 via INTRAVENOUS

## 2016-03-09 MED ORDER — PROPOFOL 10 MG/ML IV BOLUS
INTRAVENOUS | Status: AC
  Filled 2016-03-09: qty 20

## 2016-03-09 MED ORDER — PHENYLEPHRINE 40 MCG/ML (10ML) SYRINGE FOR IV PUSH (FOR BLOOD PRESSURE SUPPORT)
PREFILLED_SYRINGE | INTRAVENOUS | Status: AC
  Filled 2016-03-09: qty 10

## 2016-03-09 NOTE — Telephone Encounter (Signed)
The patient has been notified of this information and all questions answered.  He verbalized understanding of the instructions.

## 2016-03-09 NOTE — Telephone Encounter (Signed)
Oncology Nurse Navigator Documentation  Oncology Nurse Navigator Flowsheets 03/09/2016  Navigator Location [facility]  Navigator Encounter Type Introductory phone call  Abnormal Finding Date 02/27/2016  Spoke with patient and provided new patient appointment for 03/17/16 at 0830 in [facility] to see Dr. [name] and Dr. [name]. Informed him that based on tumor board discussion on 8/16, he may also see the surgeon, Dr. Barry Dienes. Navigator will call him if this is to occur. Informed of location of [place], valet service, and registration process. Reminded to bring photo ID, current insurance cards, and a current medication list, including supplements. Patient verbalizes understanding. Informed him that welcome packet will follow in the mail.

## 2016-03-09 NOTE — H&P (View-Only) (Signed)
Patient ID: Colton Bush, male   DOB: 02-08-1956, 60 y.o.   MRN: [id number]    Progress Note   Subjective   Feels good, had regular breakfast- no c/o abdominal pain or nausea    Objective   Vital signs in last 24 hours: Temp:  [97.6 F (36.4 C)-98.5 F (36.9 C)] 97.9 F (36.6 C) (08/03 0452) Pulse Rate:  [64-90] 64 (08/03 0452) Resp:  [12-18] 18 (08/03 0452) BP: (143-170)/(69-97) 143/71 (08/03 0452) SpO2:  [98 %-100 %] 99 % (08/03 0452) Weight:  [175 lb (79.4 kg)] 175 lb (79.4 kg) (08/02 1042) Last BM Date: 03/01/16 General:    White male in NAD- less jaundiced Heart:  Regular rate and rhythm; no murmurs Lungs: Respirations even and unlabored, lungs CTA bilaterally Abdomen:  Soft, nontender and nondistended. Normal bowel sounds. Extremities:  Without edema. Neurologic:  Alert and oriented,  grossly normal neurologically. Psych:  Cooperative. Normal mood and affect.  Intake/Output from previous day: 08/02 0701 - 08/03 0700 In: 1100 [I.V.:900; IV Piggyback:200] Out: 800 [Urine:800] Intake/Output this shift: Total I/O In: 240 [P.O.:240] Out: -   Lab Results:  Recent Labs  02/29/16 0504 03/02/16 0521  WBC 8.2 5.0  HGB 10.4* 11.1*  HCT 31.5* 33.7*  PLT 311 347   BMET  Recent Labs  02/29/16 0504 03/01/16 0527 03/02/16 0521  NA 131* 133* 133*  K 3.4* 3.7 3.2*  CL 103 104 100*  CO2 16* 19* 26  GLUCOSE 181* 216* 180*  BUN 17 13 15   CREATININE 0.61 0.41* <0.30*  CALCIUM 8.3* 8.7* 8.6*   LFT  Recent Labs  03/02/16 0521  PROT 6.1*  ALBUMIN 2.5*  AST 44*  ALT 168*  ALKPHOS 631*  BILITOT 3.8*   PT/INR No results for input(s): LABPROT, INR in the last 72 hours.  Studies/Results: Dg Ercp With Sphincterotomy  Result Date: 03/01/2016 CLINICAL DATA:  Jaundice, biliary ductal dilatation and pancreatic ductal dilatation on recent CT. EXAM: ERCP TECHNIQUE: Multiple spot images obtained with the fluoroscopic device and submitted for interpretation  post-procedure. FLUOROSCOPY TIME:  3.58 minutes, 58 mGy COMPARISON:  CT 02/27/2016 FINDINGS: A series of 5 fluoroscopic spot images document endoscopic catheterization and opacification of the biliary tree. There is irregular narrowing in the distal CBD. There is dilatation of the proximal CBD. The intrahepatic biliary tree is incompletely opacified, appearing dilated centrally. Subsequent images document placement of a plastic endoscopic stent across the distal CBD lesion. IMPRESSION: 1. Distal CBD stenotic lesion with proximal dilatation, and subsequent endoscopic stent placement. These images were submitted for radiologic interpretation only. Please see the procedural report for the amount of contrast and the fluoroscopy time utilized. Electronically Signed   By: Lucrezia Europe M.D.   On: 03/01/2016 13:34       Assessment / Plan:    #1  60 yo WM  With biliary obstruction secondary to pancreatic head mass - he is stable s/p ERCP/stent and brushings of mid CBD stricture yesterday - bili coming down.  Plan is for discharge today, await brushings Can stop antibiotics He is scheduled for EUS/bx ,prob metal stent next week with Dr Ardis Hughs on 8/10  At 7:30 am at [facility] We will arrange for Oncology consultation ASAP  Principal Problem:   Obstructive jaundice due to malignant neoplasm Active Problems:   Diabetes ([place])   Hyperlipidemia, mixed   Essential hypertension   GERD   Diabetes mellitus type 2, controlled ([place])   DKA (diabetic ketoacidoses) ([place])   Biliary obstruction  LOS: 4 days   Jovana Rembold  03/02/2016, 9:49 AM

## 2016-03-09 NOTE — Anesthesia Postprocedure Evaluation (Signed)
Anesthesia Post Note  Patient: Colton Bush  Procedure(s) Performed: Procedure(s) (LRB): ESOPHAGEAL ENDOSCOPIC ULTRASOUND (EUS) RADIAL (N/A)  Patient location during evaluation: PACU Anesthesia Type: MAC Level of consciousness: awake and alert Pain management: pain level controlled Vital Signs Assessment: post-procedure vital signs reviewed and stable Respiratory status: spontaneous breathing, nonlabored ventilation, respiratory function stable and patient connected to nasal cannula oxygen Cardiovascular status: stable and blood pressure returned to baseline Anesthetic complications: no    Last Vitals:  Vitals:   03/09/16 0833 03/09/16 0910  BP: (!) 91/44 (!) 141/73  Pulse: 62 68  Resp: 14 17  Temp: 36.4 C     Last Pain:  Vitals:   03/09/16 0833  TempSrc: Oral                 Marcelles Clinard DAVID

## 2016-03-09 NOTE — Discharge Instructions (Signed)
Gastrointestinal Endoscopy, Care After °Refer to this sheet in the next few weeks. These instructions provide you with information on caring for yourself after your procedure. Your caregiver may also give you more specific instructions. Your treatment has been planned according to current medical practices, but problems sometimes occur. Call your caregiver if you have any problems or questions after your procedure. °HOME CARE INSTRUCTIONS °· If you were given medicine to help you relax (sedative), do not drive, operate machinery, or sign important documents for 24 hours. °· Avoid alcohol and hot or warm beverages for the first 24 hours after the procedure. °· Only take over-the-counter or prescription medicines for pain, discomfort, or fever as directed by your caregiver. You may resume taking your normal medicines unless your caregiver tells you otherwise. Ask your caregiver when you may resume taking medicines that may cause bleeding, such as aspirin, clopidogrel, or warfarin. °· You may return to your normal diet and activities on the day after your procedure, or as directed by your caregiver. Walking may help to reduce any bloated feeling in your abdomen. °· Drink enough fluids to keep your urine clear or pale yellow. °· You may gargle with salt water if you have a sore throat. °SEEK IMMEDIATE MEDICAL CARE IF: °· You have severe nausea or vomiting. °· You have severe abdominal pain, abdominal cramps that last longer than 6 hours, or abdominal swelling (distention). °· You have severe shoulder or back pain. °· You have trouble swallowing. °· You have shortness of breath, your breathing is shallow, or you are breathing faster than normal. °· You have a fever or a rapid heartbeat. °· You vomit blood or material that looks like coffee grounds. °· You have bloody, black, or tarry stools. °MAKE SURE YOU: °· Understand these instructions. °· Will watch your condition. °· Will get help right away if you are not doing  well or get worse. °  °This information is not intended to replace advice given to you by your health care provider. Make sure you discuss any questions you have with your health care provider. °  °Document Released: 02/29/2004 Document Revised: 08/07/2014 Document Reviewed: 10/17/2011 °Elsevier Interactive Patient Education ©2016 Elsevier Inc. ° °

## 2016-03-09 NOTE — Telephone Encounter (Signed)
The CT has been cancelled and the pt is aware.  I have forwarded the previous CT to Dr Ardis Hughs for review

## 2016-03-09 NOTE — Transfer of Care (Signed)
Immediate Anesthesia Transfer of Care Note  Patient: Colton Bush  Procedure(s) Performed: Procedure(s): ESOPHAGEAL ENDOSCOPIC ULTRASOUND (EUS) RADIAL (N/A)  Patient Location: PACU  Anesthesia Type:MAC  Level of Consciousness: sedated  Airway & Oxygen Therapy: Patient Spontanous Breathing and Patient connected to nasal cannula oxygen  Post-op Assessment: Report given to RN and Post -op Vital signs reviewed and stable  Post vital signs: Reviewed and stable  Last Vitals:  Vitals:   03/09/16 0640  BP: 135/81  Pulse: 75  Resp: 15  Temp: 36.6 C    Last Pain:  Vitals:   03/09/16 0640  TempSrc: Oral         Complications: No apparent anesthesia complications

## 2016-03-09 NOTE — Interval H&P Note (Signed)
History and Physical Interval Note:  03/09/2016 7:06 AM  Colton Bush  has presented today for surgery, with the diagnosis of pancreatic mass  The various methods of treatment have been discussed with the patient and family. After consideration of risks, benefits and other options for treatment, the patient has consented to  Procedure(s): ESOPHAGEAL ENDOSCOPIC ULTRASOUND (EUS) RADIAL (N/A) as a surgical intervention .  The patient's history has been reviewed, patient examined, no change in status, stable for surgery.  I have reviewed the patient's chart and labs.  Questions were answered to the patient's satisfaction.     Milus Banister

## 2016-03-09 NOTE — Telephone Encounter (Signed)
Referral made to Med Onc and chest CT scheduled   You have been scheduled for a CT scan of the abdomen and pelvis at [facility] ([address]---this is in the same building as [facility]).   You are scheduled on 03/14/16 at 930 am. You should arrive 15 minutes prior to your appointment time for registration. Please follow the written instructions below on the day of your exam:  WARNING: IF YOU ARE ALLERGIC TO IODINE/X-RAY DYE, PLEASE NOTIFY RADIOLOGY IMMEDIATELY AT [phone]! YOU WILL BE GIVEN A 13 HOUR PREMEDICATION PREP.  1) Do not eat or drink anything after 730 am (4 hours prior to your test) You may take any medications as prescribed with a small amount of water except for the following: Metformin, Glucophage, Glucovance, Avandamet, Riomet, Fortamet, Actoplus Met, Janumet, Glumetza or Metaglip. The above medications must be held the day of the exam AND 48 hours after the exam.  The purpose of you drinking the oral contrast is to aid in the visualization of your intestinal tract. The contrast solution may cause some diarrhea. Before your exam is started, you will be given a small amount of fluid to drink. Depending on your individual set of symptoms, you may also receive an intravenous injection of x-ray contrast/dye. Plan on being at [facility] for 30 minutes or longer, depending on the type of exam you are having performed.  This test typically takes 30-45 minutes to complete.  If you have any questions regarding your exam or if you need to reschedule, you may call the CT department at [phone] between the hours of 8:00 am and 5:00 pm, Monday-Friday.  ________________________________________________________________________  Left message on machine to call back

## 2016-03-09 NOTE — Op Note (Signed)
[facility] Patient Name: Colton Bush Procedure Date: 03/09/2016 MRN: [id number] Attending MD: Milus Banister , MD Date of Birth: 31-Aug-1955 CSN: [id number] Age: 60 Admit Type: Outpatient Procedure:                Upper EUS Indications:              Suspected mass in pancreas on CT scan; presented                            with painless jaundice, mass in pancreatic head,                            biliary and PD duct dilation, s/p ERCP last week                            with Dr. Loletha Carrow a plastic stent was placed across                            distal CBD stricture, brushing showed atypical                            cells. Providers:                Milus Banister, MD, Cleda Daub, RN, William [name], Technician Referring MD:             Jolly Mango, MD; Wilfrid Lund, MD Medicines:                Monitored Anesthesia Care Complications:            No immediate complications. Estimated blood loss:                            None. Estimated Blood Loss:     Estimated blood loss: none. Procedure:                Pre-Anesthesia Assessment:                           - Prior to the procedure, a History and Physical                            was performed, and patient medications and                            allergies were reviewed. The patient's tolerance of                            previous anesthesia was also reviewed. The risks                            and benefits of the procedure and the sedation  options and risks were discussed with the patient.                            All questions were answered, and informed consent                            was obtained. Prior Anticoagulants: The patient has                            taken no previous anticoagulant or antiplatelet                            agents. ASA Grade Assessment: II - A patient with                            mild systemic disease.  After reviewing the risks                            and benefits, the patient was deemed in                            satisfactory condition to undergo the procedure.                           After obtaining informed consent, the endoscope was                            passed under direct vision. Throughout the                            procedure, the patient's blood pressure, pulse, and                            oxygen saturations were monitored continuously. The                            [id number]) scope was introduced through                            the mouth, and advanced to the second part of                            duodenum. The [id number]) scope was                            introduced through the mouth, and advanced to the                            second part of duodenum. The upper EUS was                            accomplished without difficulty. The patient  tolerated the procedure well. Scope In: Scope Out: Findings:      Endoscopic Finding :      The examined esophagus was endoscopically normal.      The entire examined stomach was endoscopically normal.      The examined duodenum was endoscopically norma except for the previously       placed plastic biliary stent that was in good position extending across       the major papilla into the duodenum.      Endosonographic Finding :      1. An irregular mass was identified in the pancreatic head. The mass was       hypoechoic, heterogenous and mixed solid and cystic. The mass measured       35 mm in maximal cross-sectional diameter. The endosonographic borders       were poorly-defined. There was sonographic evidence suggesting invasion       into the portal vein (manifested by interface loss of 40mm). The celiac       trunk, SMA, SMV were not involved with the mass. The remainder of the       pancreas was examined. The endosonographic appearance of parenchyma and        the upstream pancreatic duct indicated duct dilation and a maximum duct       diameter of 7 mm. Fine needle aspiration for cytology was performed.       Color Doppler imaging was utilized prior to needle puncture to confirm a       lack of significant vascular structures within the needle path. Three       passes were made with the 25 gauge needle using a transduodenal       approach. Some passes were made with a stylet. A cytotechnologist was       present to evaluate the adequacy of the specimen. Final cytology results       are pending.      2. One peripancreatic lymphnode, measuring 1.2cm across, directly       abuting the above mass.      3. Previously placed plastic CBD stent in good position, CBD diameter       1.1cm currently.      4. Limited views of liver, spleen, splenic vessels were all normal. Impression:               - 3.5cm irregularly shaped, poorly defined mass in                            the pancreatic head. There is suspicious, nearby                            adenopathy. The mass is causing pancreatic and                            bililary duct obstruction (previously stented). The                            mass involves the portal vein for 94mm (abuttment                            and loss of usual tissue interface), strongly  suggesting invasion. Preliminary cytology results                            are positive for malignancy (adenocarcinoma), await                            final report. Moderate Sedation:      N/A- Per Anesthesia Care Recommendation:           - Discharge patient to home.                           - Await cytology results.                           - [facility] will refer to medical oncology and                            will put on for discussion at the next GI cancer                            conference. Procedure Code(s):        --- Professional ---                           [id number],  Esophagogastroduodenoscopy, flexible,                            transoral; with transendoscopic ultrasound-guided                            intramural or transmural fine needle                            aspiration/biopsy(s), (includes endoscopic                            ultrasound examination limited to the esophagus,                            stomach or duodenum, and adjacent structures) Diagnosis Code(s):        --- Professional ---                           K86.89, Other specified diseases of pancreas                           R93.3, Abnormal findings on diagnostic imaging of                            other parts of digestive tract CPT copyright 2016 American Medical Association. All rights reserved. The codes documented in this report are preliminary and upon coder review may  be revised to meet current compliance requirements. Milus Banister, MD 03/09/2016 8:35:13 AM This report has been signed electronically. Number of Addenda: 0

## 2016-03-09 NOTE — Anesthesia Preprocedure Evaluation (Signed)
Anesthesia Evaluation  Patient identified by MRN, date of birth, ID band Patient awake    Reviewed: Allergy & Precautions, NPO status , Patient's Chart, lab work & pertinent test results  Airway Mallampati: I  TM Distance: >3 FB Neck ROM: Full    Dental   Pulmonary    Pulmonary exam normal        Cardiovascular hypertension, Pt. on medications Normal cardiovascular exam     Neuro/Psych Depression    GI/Hepatic GERD  Medicated and Controlled,  Endo/Other  diabetes  Renal/GU      Musculoskeletal   Abdominal   Peds  Hematology   Anesthesia Other Findings   Reproductive/Obstetrics                             Anesthesia Physical Anesthesia Plan  ASA: III  Anesthesia Plan: MAC   Post-op Pain Management:    Induction: Intravenous  Airway Management Planned: Mask  Additional Equipment:   Intra-op Plan:   Post-operative Plan:   Informed Consent: I have reviewed the patients History and Physical, chart, labs and discussed the procedure including the risks, benefits and alternatives for the proposed anesthesia with the patient or authorized representative who has indicated his/her understanding and acceptance.     Plan Discussed with: CRNA and Surgeon  Anesthesia Plan Comments:         Anesthesia Quick Evaluation

## 2016-03-09 NOTE — Telephone Encounter (Signed)
-----   Message from Milus Banister, MD sent at 03/09/2016  8:36 AM EDT ----- All, Just completed EUS.  Preliminary cytology is positive for malignancy, likely adeno.  The mass involves portal vein and so is not up-front resectable.    Colton Bush, He needs referral to medical oncology for newly diagnosed pancreatic adenocarcinoma, involving portal vein but no sign of distant mets.  I don't think he's had a chest CT so he'll need that ordered as well (staging).    [name], See above. Can you put him on for next GI cancer conference and help with med onc referral?  Thanks

## 2016-03-10 ENCOUNTER — Encounter (HOSPITAL_COMMUNITY): Payer: Self-pay | Admitting: Gastroenterology

## 2016-03-10 ENCOUNTER — Ambulatory Visit (INDEPENDENT_AMBULATORY_CARE_PROVIDER_SITE_OTHER): Payer: 59 | Admitting: Family Medicine

## 2016-03-10 VITALS — BP 110/70 | HR 88 | Temp 98.5°F | Ht 70.0 in | Wt 181.0 lb

## 2016-03-10 DIAGNOSIS — E119 Type 2 diabetes mellitus without complications: Secondary | ICD-10-CM

## 2016-03-10 DIAGNOSIS — K838 Other specified diseases of biliary tract: Secondary | ICD-10-CM

## 2016-03-10 DIAGNOSIS — K831 Obstruction of bile duct: Secondary | ICD-10-CM | POA: Diagnosis not present

## 2016-03-10 DIAGNOSIS — I10 Essential (primary) hypertension: Secondary | ICD-10-CM

## 2016-03-10 DIAGNOSIS — C801 Malignant (primary) neoplasm, unspecified: Secondary | ICD-10-CM

## 2016-03-10 NOTE — Progress Notes (Signed)
Pre visit review using our clinic review tool, if applicable. No additional management support is needed unless otherwise documented below in the visit note. 

## 2016-03-10 NOTE — Progress Notes (Signed)
   Subjective:    Patient ID: Colton Bush, male    DOB: 1956/02/02, 60 y.o.   MRN: [id number]  HPI Here to follow up on his blocked common bile duct, which appears to have resulted from a pancreatic mass. When he was here last he was weak, could not eat due to nausea, and he had lost a lot of weight. He was jaundiced as well. His liver enzymes were elevated and a CT scan revealed a mass at the head of the pancreas. He has had an ERCP with placement of a stent in the CBD, and this has given him a measure of relief. He feels better, the nausea has decreased, and his appetite has returned. He has gained 6 lbs in the past week. He had an endoscopic [place] yesterday with a fine needle aspiration of the mass. Preliminary results showed malignancy but the final report is still pending. His BP has been stable at home and his glucoses have been running in the high 100s to low 200s. He says his moods have been good so far and he is trying to maintain a positive outlook on this process.   Review of Systems  Constitutional: Positive for fatigue. Negative for chills, diaphoresis and fever.  Respiratory: Negative.   Cardiovascular: Negative.   Gastrointestinal: Negative.   Endocrine: Negative.   Neurological: Negative.   Psychiatric/Behavioral: Negative.        Objective:   Physical Exam  Constitutional: He is oriented to person, place, and time.  Thin, but looks a little stronger than the last time. The jaundice has reduced quite a bit   Neck: No thyromegaly present.  Cardiovascular: Normal rate, regular rhythm, normal heart sounds and intact distal pulses.   Pulmonary/Chest: Effort normal and breath sounds normal.  Abdominal: Soft. Bowel sounds are normal. He exhibits no distension and no mass. There is no tenderness. There is no rebound and no guarding.  Lymphadenopathy:    He has no cervical adenopathy.  Neurological: He is alert and oriented to person, place, and time.  Psychiatric: He has a  normal mood and affect. His behavior is normal. Thought content normal.          Assessment & Plan:  Following up on weight loss and nausea from a CBD obstruction. He feels much better since the stent was placed and his appetite has returned. I encouraged him to eat as much as he can to increase his strength. He most likely has pancreatic cancer and he has a good outlook so far. His HTN is stable. I told him no to worry about his glucoses running a little high right now because I want him to maximize his nutrition. We spent 45 minutes discussing these things.  [name], MD

## 2016-03-14 ENCOUNTER — Other Ambulatory Visit: Payer: Self-pay

## 2016-03-14 ENCOUNTER — Other Ambulatory Visit: Payer: Self-pay | Admitting: Endocrinology

## 2016-03-15 ENCOUNTER — Telehealth: Payer: Self-pay | Admitting: *Deleted

## 2016-03-15 NOTE — Telephone Encounter (Signed)
  Oncology Nurse Navigator Documentation  Navigator Location: [facility] (03/15/16 1000) Navigator Encounter Type: Telephone (03/15/16 1000) Telephone: Appt Confirmation/Clarification (03/15/16 1000)   Called patient and made him aware of his case presentation today in tumor board. Dr. Barry Dienes feels he is resection candidate and inquired if he would like to see her also in GI Clinic Friday. He agrees to see surgeon also. He expresses concern about forms for his employer to keep his job. Very focused on needing to work. Instructed him to bring his forms in and we can get started on them for him. Notified [facility] to add him to schedule for Dr. Barry Dienes to see. Demographics and insurance information faxed to [facility].

## 2016-03-17 ENCOUNTER — Encounter: Payer: Self-pay | Admitting: *Deleted

## 2016-03-17 ENCOUNTER — Other Ambulatory Visit: Payer: Self-pay | Admitting: General Surgery

## 2016-03-17 ENCOUNTER — Telehealth: Payer: Self-pay | Admitting: Hematology

## 2016-03-17 ENCOUNTER — Encounter: Payer: Self-pay | Admitting: Hematology

## 2016-03-17 ENCOUNTER — Ambulatory Visit: Payer: Self-pay

## 2016-03-17 ENCOUNTER — Telehealth: Payer: Self-pay | Admitting: *Deleted

## 2016-03-17 ENCOUNTER — Ambulatory Visit
Admission: RE | Admit: 2016-03-17 | Discharge: 2016-03-17 | Disposition: A | Discharge status: Home / Self Care | Payer: 59 | Source: Ambulatory Visit | Attending: Radiation Oncology | Admitting: Radiation Oncology

## 2016-03-17 ENCOUNTER — Ambulatory Visit: Payer: 59 | Admitting: Nutrition

## 2016-03-17 ENCOUNTER — Ambulatory Visit (HOSPITAL_BASED_OUTPATIENT_CLINIC_OR_DEPARTMENT_OTHER): Payer: 59 | Admitting: Hematology

## 2016-03-17 VITALS — BP 135/73 | HR 75 | Temp 98.5°F | Resp 17 | Ht 70.0 in | Wt 183.7 lb

## 2016-03-17 DIAGNOSIS — I10 Essential (primary) hypertension: Secondary | ICD-10-CM | POA: Diagnosis not present

## 2016-03-17 DIAGNOSIS — C250 Malignant neoplasm of head of pancreas: Secondary | ICD-10-CM | POA: Diagnosis not present

## 2016-03-17 DIAGNOSIS — E119 Type 2 diabetes mellitus without complications: Secondary | ICD-10-CM | POA: Diagnosis not present

## 2016-03-17 MED ORDER — PROCHLORPERAZINE MALEATE 10 MG PO TABS: 10.0000 mg | ORAL_TABLET | Freq: Four times a day (QID) | ORAL | 1 refills | Status: DC | PRN

## 2016-03-17 MED ORDER — LIDOCAINE-PRILOCAINE 2.5-2.5 % EX CREA: TOPICAL_CREAM | CUTANEOUS | 3 refills | Status: DC

## 2016-03-17 MED ORDER — ONDANSETRON HCL 8 MG PO TABS: 8.0000 mg | ORAL_TABLET | Freq: Two times a day (BID) | ORAL | 1 refills | Status: DC | PRN

## 2016-03-17 NOTE — Progress Notes (Signed)
Radiation Oncology         [phone] ________________________________  Name: Colton Bush MRN: [id number]  Date: 03/17/2016  DOB: 08-Feb-1956  CC:FRY,STEPHEN A, MD  Milus Banister, MD     REFERRING PHYSICIAN: Milus Banister, MD   DIAGNOSIS: The encounter diagnosis was Adenocarcinoma of head of pancreas ([place]).   HISTORY OF PRESENT ILLNESS: Colton Bush is a 60 y.o. male seen at the request of Dr. Ardis Hughs. The patient was seen in the ED and admitted on 02/27/16 with a three month history of 40 pound weight loss, poor appetite, jaundice, and fatigue. He underwent CT imaging which revealed an ill defined mass in the head of the pancreas, with pancreatic duct dilatation to 1.2 cm, and common bile duct distention to 1.5 cm. A 2 cm short axis lymph node in the portacaval space was noted. His T bili was 11, and CA 19-9 was 1497 on 02/28/16. On 03/01/16, an ERCP was performed with stent placement in the common bile duct. Brushings were obtained and revealed atypical cells His bilirubin trended down, and he was discharged on 03/02/16. Subsequent endoscopic ultrasound on 03/09/16  Revealed a 35 mm hypoechoic and mixed solid/cystic mass in the pancreatic head. There is suspicion of involvement of the portal vein, but not of the celiac trunk, SMA, or SMV. One peripancreatic node measuring 1.2 cm was noted above the mass, and his stent was in good position. A FNA of the mass revealed adenocarcinoma consistent with pancreatic primary.  During [facility] conference, it was recommended that he consider neoadjuvant chemotherapy, SBRT following chemotherapy, and subsequent surgical resection. He comes today to discuss his options for radiotherapy with Dr. [name].   PREVIOUS RADIATION THERAPY: No   PAST MEDICAL HISTORY:  Past Medical History:  Diagnosis Date  . Arthritis    left hand  . Bronchitis 1977  . Depression    takes Cymbalta daily  . Diabetes mellitus type II    sees [name]   . GERD  (gastroesophageal reflux disease)    takes Omeprazole daily  . H/O hiatal hernia   . Hyperlipidemia    takes Zocor daily  . Hypertension    takes Amlodipine daily  . Neck pain    C4-7 stenosis and herniated disc  . Neuromuscular disorder ([place])    hiatal hernia  . Scoliosis    slight  . Spinal cord injury, C5-C7 ([place])    c4-c7  . Stiffness of hand joint    d/t cervical issues       PAST SURGICAL HISTORY: Past Surgical History:  Procedure Laterality Date  . ANTERIOR CERVICAL DECOMP/DISCECTOMY FUSION  08/18/2011   Procedure: ANTERIOR CERVICAL DECOMPRESSION/DISCECTOMY FUSION 3 LEVELS;  Surgeon: Winfield Cunas, MD;  Location: [facility];  Service: Neurosurgery;  Laterality: N/A;  Anterior Cervical Four-Five/Five-Six/Six-Seven Decompression with Fusion, Plating, and Bonegraft  . CARPAL TUNNEL RELEASE  2013   bilateral, per Dr. Christella Noa   . COLONOSCOPY  10-30-14   per Dr. Olevia Perches, clear, repeat in 10 yrs   . egd with esophageal dilation  9-08   per Dr. Olevia Perches  . ERCP N/A 03/01/2016   Procedure: ENDOSCOPIC RETROGRADE CHOLANGIOPANCREATOGRAPHY (ERCP) with brushings and stent;  Surgeon: Doran Stabler, MD;  Location: [facility];  Service: Endoscopy;  Laterality: N/A;  . EUS N/A 03/09/2016   Procedure: ESOPHAGEAL ENDOSCOPIC ULTRASOUND (EUS) RADIAL;  Surgeon: Milus Banister, MD;  Location: [facility];  Service: Endoscopy;  Laterality: N/A;  . lymph nodes biopsy    .  melanoma rt calf  1999  . SPINE SURGERY    . TONSILLECTOMY     as a child  . ULNAR TUNNEL RELEASE  2013   right arm, per Dr. Christella Noa   . UPPER GASTROINTESTINAL ENDOSCOPY       FAMILY HISTORY:  Family History  Problem Relation Age of Onset  . Heart disease Father   . Heart disease Brother 48  . Anesthesia problems Mother   . Heart disease Mother   . Dementia Mother   . Diabetes Sister   . Stroke Sister   . Colon cancer Neg Hx   . Rectal cancer Neg Hx   . Stomach cancer Neg Hx      SOCIAL HISTORY:  reports  that he has never smoked. He has never used smokeless tobacco. He reports that he does not drink alcohol or use drugs. The patient is married and resides in [place]. 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   ALLERGIES: Review of patient's allergies indicates no known allergies.   MEDICATIONS:  Current Outpatient Prescriptions  Medication Sig Dispense Refill  . amLODipine (NORVASC) 10 MG tablet Take 1 tablet (10 mg total) by mouth daily. 30 tablet 1  . benazepril (LOTENSIN) 10 MG tablet Take 1 tablet (10 mg total) by mouth daily. 30 tablet 1  . docusate sodium (COLACE) 100 MG capsule Take 1 capsule (100 mg total) by mouth 2 (two) times daily. (Patient not taking: Reported on 03/17/2016) 10 capsule 0  . Insulin Detemir (LEVEMIR) 100 UNIT/ML Pen Inject 20 units into the skin each evening. (Patient not taking: Reported on 03/10/2016) 30 mL 3  . insulin glargine (LANTUS) 100 UNIT/ML injection Inject 0.2 mLs (20 Units total) into the skin daily. 10 mL 11  . Insulin Pen Needle 30G X 5 MM MISC Use one daily with insulin 100 each 2  . Lancets (FREESTYLE) lancets USE AS INSTRUCTED TO CHECK BLOOD SUGAR ONCE A DAY 100 each 1  . Multiple Vitamin (MULTIVITAMIN WITH MINERALS) TABS tablet Take 1 tablet by mouth daily.    Marland Kitchen omega-3 acid ethyl esters (LOVAZA) 1 g capsule Take 2 g by mouth daily.    Marland Kitchen omeprazole (PRILOSEC) 20 MG capsule Take 20 mg by mouth daily.    Marland Kitchen oxyCODONE (OXY IR/ROXICODONE) 5 MG immediate release tablet Take 1 tablet (5 mg total) by mouth every 4 (four) hours as needed for moderate pain. (Patient not taking: Reported on 03/10/2016) 10 tablet 0  . polyethylene glycol (MIRALAX / GLYCOLAX) packet Take 17 g by mouth daily as needed for mild constipation. (Patient not taking: Reported on 03/17/2016) 14 each 0   No current facility-administered medications for this encounter.      REVIEW OF SYSTEMS: On review of systems, the patient reports that he is doing well overall. He denies  any chest pain, shortness of breath, cough, fevers, chills, night sweats. He has lost at least 50 pounds in the past 2 months.  He denies any bowel or bladder disturbances, and denies abdominal pain, nausea or vomiting. He denies any new musculoskeletal or joint aches or pains. A complete review of systems is obtained and is otherwise negative.     PHYSICAL EXAM:   Pain scale 0/10 In general this is a well appearing Caucasian male in no acute distress. He is alert and oriented x4 and appropriate throughout the examination. HEENT reveals that the patient is normocephalic, atraumatic. EOMs are intact. PERRLA, and sclera is anicteric. Skin is intact without  any evidence of gross lesions and also without jaundice. Cardiovascular exam reveals a regular rate and rhythm, no clicks rubs or murmurs are auscultated. Chest is clear to auscultation bilaterally. Lymphatic assessment is performed and does not reveal any adenopathy in the cervical, supraclavicular, axillary, or inguinal chains. Abdomen has active bowel sounds in all quadrants and is intact. The abdomen is soft, non tender, non distended. Lower extremities are negative for pretibial pitting edema, deep calf tenderness, cyanosis or clubbing.   ECOG = 1  0 - Asymptomatic (Fully active, able to carry on all predisease activities without restriction)  1 - Symptomatic but completely ambulatory (Restricted in physically strenuous activity but ambulatory and able to carry out work of a light or sedentary nature. For example, light housework, office work)  2 - Symptomatic, <50% in bed during the day (Ambulatory and capable of all self care but unable to carry out any work activities. Up and about more than 50% of waking hours)  3 - Symptomatic, >50% in bed, but not bedbound (Capable of only limited self-care, confined to bed or chair 50% or more of waking hours)  4 - Bedbound (Completely disabled. Cannot carry on any self-care. Totally confined to bed  or chair)  5 - Death   Eustace Pen MM, Creech RH, Tormey DC, et al. [phone]). "Toxicity and response criteria of the [facility]". [place] Oncol. 5 (6): 649-55    LABORATORY DATA:  Lab Results  Component Value Date   WBC 5.0 03/02/2016   HGB 11.1 (L) 03/02/2016   HCT 33.7 (L) 03/02/2016   MCV 102.1 (H) 03/02/2016   PLT 347 03/02/2016   Lab Results  Component Value Date   NA 133 (L) 03/02/2016   K 3.2 (L) 03/02/2016   CL 100 (L) 03/02/2016   CO2 26 03/02/2016   Lab Results  Component Value Date   ALT 168 (H) 03/02/2016   AST 44 (H) 03/02/2016   ALKPHOS 631 (H) 03/02/2016   BILITOT 3.8 (H) 03/02/2016      RADIOGRAPHY: Ct Chest W Contrast  Result Date: 02/27/2016 CLINICAL DATA:  Weight loss EXAM: CT CHEST, ABDOMEN, AND PELVIS WITH CONTRAST TECHNIQUE: Multidetector CT imaging of the chest, abdomen and pelvis was performed following the standard protocol during bolus administration of intravenous contrast. CONTRAST:  183mL ISOVUE-300 IOPAMIDOL (ISOVUE-300) INJECTION 61% COMPARISON:  None. FINDINGS: CT CHEST FINDINGS Mediastinum/Lymph Nodes: Heart size is normal. No pericardial effusion. Thoracic aorta is normal in caliber and configuration. No mass or enlarged lymph nodes seen within the mediastinum or perihilar regions. Trachea and central bronchi are unremarkable. Lungs/Pleura: Lungs are clear. No pulmonary nodule or mass. No consolidation. No pleural effusion or pneumothorax. Musculoskeletal: Dextroscoliosis of the thoracic spine, measuring approximately 25 degrees. Mild degenerative change within the thoracic spine. No acute or suspicious osseous finding. Superficial soft tissues are unremarkable. CT ABDOMEN PELVIS FINDINGS Hepatobiliary: Diffuse intrahepatic bile duct dilatation. Common bile duct distended to approximately 1.5 cm. No focal enhancing mass or lesion identified within the liver. Gallbladder is moderately distended but otherwise unremarkable.  Pancreas: Pancreatic duct dilatation, dilated up to approximately 1.2 cm. Heterogeneity of the pancreatic head, ill-defined, almost certainly representing a pancreatic carcinoma. Spleen: Within normal limits in size and appearance. Adrenals/Urinary Tract: Adrenal glands appear normal. Kidneys appear normal without stone or hydronephrosis. Stomach/Bowel: Bowel is normal in caliber. No bowel wall thickening or evidence of bowel wall inflammation. Appendix is normal. Vascular/Lymphatic: Mildly prominent lymph nodes within the upper abdomen, including a 2  cm short axis lymph node in the portacaval space. No enlarged lymph nodes elsewhere in the abdomen or pelvis. Mild atherosclerotic changes of the normal caliber abdominal aorta. Reproductive: No mass or other significant abnormality. Other: No free fluid or abscess collection identified. No free intraperitoneal air. Musculoskeletal: Degenerative changes throughout the thoracolumbar spine, moderate in degree. No acute or suspicious osseous finding. Sclerotic lesion within the left iliac bone is most likely a benign bone island. Superficial soft tissues are unremarkable. IMPRESSION: 1. Heterogeneity of the pancreatic head, ill-defined limiting measurement, almost certainly representing a pancreatic carcinoma given the combination of bile duct dilatation and pancreatic duct dilatation. Recommend pancreas MRI for further characterization. 2. Mildly prominent lymph nodes within the upper abdomen, suspicious for local metastatic lymphadenopathy, particularly prominent lymph node in the portacaval space measuring 2 cm short axis dimension. No enlarged lymph nodes elsewhere in the abdomen or pelvis. No evidence of distant metastasis. 3. Normal chest CT. 4. Scoliosis. No evidence of osseous metastasis. Probable bone island within the left iliac bone. These results were called by telephone at the time of interpretation on 02/27/2016 at 7:15 pm to Dr. Quintella Reichert , who verbally  acknowledged these results. Electronically Signed   By: Franki Cabot M.D.   On: 02/27/2016 19:23  Ct Abdomen Pelvis W Contrast  Result Date: 02/27/2016 CLINICAL DATA:  Weight loss EXAM: CT CHEST, ABDOMEN, AND PELVIS WITH CONTRAST TECHNIQUE: Multidetector CT imaging of the chest, abdomen and pelvis was performed following the standard protocol during bolus administration of intravenous contrast. CONTRAST:  139mL ISOVUE-300 IOPAMIDOL (ISOVUE-300) INJECTION 61% COMPARISON:  None. FINDINGS: CT CHEST FINDINGS Mediastinum/Lymph Nodes: Heart size is normal. No pericardial effusion. Thoracic aorta is normal in caliber and configuration. No mass or enlarged lymph nodes seen within the mediastinum or perihilar regions. Trachea and central bronchi are unremarkable. Lungs/Pleura: Lungs are clear. No pulmonary nodule or mass. No consolidation. No pleural effusion or pneumothorax. Musculoskeletal: Dextroscoliosis of the thoracic spine, measuring approximately 25 degrees. Mild degenerative change within the thoracic spine. No acute or suspicious osseous finding. Superficial soft tissues are unremarkable. CT ABDOMEN PELVIS FINDINGS Hepatobiliary: Diffuse intrahepatic bile duct dilatation. Common bile duct distended to approximately 1.5 cm. No focal enhancing mass or lesion identified within the liver. Gallbladder is moderately distended but otherwise unremarkable. Pancreas: Pancreatic duct dilatation, dilated up to approximately 1.2 cm. Heterogeneity of the pancreatic head, ill-defined, almost certainly representing a pancreatic carcinoma. Spleen: Within normal limits in size and appearance. Adrenals/Urinary Tract: Adrenal glands appear normal. Kidneys appear normal without stone or hydronephrosis. Stomach/Bowel: Bowel is normal in caliber. No bowel wall thickening or evidence of bowel wall inflammation. Appendix is normal. Vascular/Lymphatic: Mildly prominent lymph nodes within the upper abdomen, including a 2 cm short axis  lymph node in the portacaval space. No enlarged lymph nodes elsewhere in the abdomen or pelvis. Mild atherosclerotic changes of the normal caliber abdominal aorta. Reproductive: No mass or other significant abnormality. Other: No free fluid or abscess collection identified. No free intraperitoneal air. Musculoskeletal: Degenerative changes throughout the thoracolumbar spine, moderate in degree. No acute or suspicious osseous finding. Sclerotic lesion within the left iliac bone is most likely a benign bone island. Superficial soft tissues are unremarkable. IMPRESSION: 1. Heterogeneity of the pancreatic head, ill-defined limiting measurement, almost certainly representing a pancreatic carcinoma given the combination of bile duct dilatation and pancreatic duct dilatation. Recommend pancreas MRI for further characterization. 2. Mildly prominent lymph nodes within the upper abdomen, suspicious for local metastatic lymphadenopathy, particularly prominent lymph  node in the portacaval space measuring 2 cm short axis dimension. No enlarged lymph nodes elsewhere in the abdomen or pelvis. No evidence of distant metastasis. 3. Normal chest CT. 4. Scoliosis. No evidence of osseous metastasis. Probable bone island within the left iliac bone. These results were called by telephone at the time of interpretation on 02/27/2016 at 7:15 pm to Dr. Quintella Reichert , who verbally acknowledged these results. Electronically Signed   By: Franki Cabot M.D.   On: 02/27/2016 19:23  Dg Ercp With Sphincterotomy  Result Date: 03/01/2016 CLINICAL DATA:  Jaundice, biliary ductal dilatation and pancreatic ductal dilatation on recent CT. EXAM: ERCP TECHNIQUE: Multiple spot images obtained with the fluoroscopic device and submitted for interpretation post-procedure. FLUOROSCOPY TIME:  3.58 minutes, 58 mGy COMPARISON:  CT 02/27/2016 FINDINGS: A series of 5 fluoroscopic spot images document endoscopic catheterization and opacification of the biliary  tree. There is irregular narrowing in the distal CBD. There is dilatation of the proximal CBD. The intrahepatic biliary tree is incompletely opacified, appearing dilated centrally. Subsequent images document placement of a plastic endoscopic stent across the distal CBD lesion. IMPRESSION: 1. Distal CBD stenotic lesion with proximal dilatation, and subsequent endoscopic stent placement. These images were submitted for radiologic interpretation only. Please see the procedural report for the amount of contrast and the fluoroscopy time utilized. Electronically Signed   By: Lucrezia Europe M.D.   On: 03/01/2016 13:34       IMPRESSION/PLAN: 1. Stage IIB, T2, N1, M0 adenocarcinoma of the pancreas. Dr. [name] meets with the patient and reviews his previous biopsy, CT and imaging studies to date. He reviews the recommendations for proceeding with neoadjuvant chemotherapy, followed by SBRT to the pancreas. Dr. [name] discusses this as a consolidative treatment and reviews approximately 5 fractions over 2 weeks once he finishes chemotherapy. We discussed the rationale following this for surgical intervention with Dr. Barry Dienes. He will meet with Dr. Barry Dienes today, move forward with chemotherapy under Dr. [name], and see [place] back in anticipation of SBRT about 3-4 weeks after completion of chemotherapy.  The above documentation reflects my direct findings during this shared patient visit. Please see the separate note by Dr. [name] on this date for the remainder of the patient's plan of care.    Carola Rhine, PAC

## 2016-03-17 NOTE — Telephone Encounter (Signed)
Gave pt cal & avs, pt tx sched for 8/29 because chemo room capped on 8/28

## 2016-03-17 NOTE — Patient Instructions (Signed)
Care Plan Summary- 03/17/2016 Name:  Colton Bush       DOB: 1955/11/01 Your Medical Team: Medical Oncologist:  Dr. Truitt Merle Radiation Oncologist:  Dr. Kyung Rudd  Surgeon:   Dr. Stark Klein   Type of Cancer: Adenocarcinoma of Pancreas  Stage/Grade: Stage IIB *Exact staging of your cancer is based on size of the tumor, depth of invasion, involvement of lymph nodes or not, and whether or not the cancer has spread beyond the primary site   Recommendations: Based on information available as of today's consult. Recommendations may change depending on the results of further tests or exams. 1)  Chemotherapy with FOLFIRINOX regimen-? Start by 8/28 2)   Radiation therapy (SBRT) 3)  Next Steps: 1) Labs today to include CA 19.9 marker 2) Dr. Marlowe Aschoff office will call to schedule the Portacath placement 3)  Chemotherapy education class next week and see Dr. [name] on 03/27/16 ______________________________________________________________________________ Questions? Merceda Elks, RN, BSN at [phone]. [name] is your Oncology Nurse Navigator and is available to assist you while you're receiving our medical care at [facility].

## 2016-03-17 NOTE — Progress Notes (Signed)
Oncology Nurse Navigator Documentation  Oncology Nurse Navigator Flowsheets 03/17/2016  Navigator Location [facility]  Navigator Encounter Type Clinic/MDC  Telephone -  Abnormal Finding Date -  Confirmed Diagnosis Date 03/09/2016  Patient Visit Type MedOnc;RadOnc;Surgery  Treatment Phase Pre-Tx/Tx Discussion  Barriers/Navigation Needs Education;Coordination of Care  Education Understanding Cancer/ Treatment Options;Newly Diagnosed Cancer Education;Preparing for Upcoming Surgery/ Treatment;Concerns with Finances/ Eligibility  Interventions Education Method;Other--sent FMLA form to managed care  Education Method Verbal;Written;Teach-back  Support Groups/Services American [employer]--Personal Health Manager  Acuity Level 2  Time Spent with Patient 86  Met with patient and wife, [name] during [facility] visit. Explained the role of the GI Nurse Navigator and provided New Patient Packet with information on: 1. Pancreas cancer--CA 19.9 marker and anatomy of pancreas 2. Support groups 3. Advanced Directives 4. Fall Safety Plan Answered questions, reviewed current treatment plan using TEACH back and provided emotional support. Provided copy of current treatment plan. Mr. Szczesniak has decided to extend his FMLA to involve date 03/02/16 to 04/03/16 to be sure he can tolerate his chemotherapy. Forms placed in box for managed care to pick up. Encourage him to check with his employer to determine if he has short term disability benefits there. If he does not, may want to consider adding this to his benefits plan for 2018. He wants to work if he can and says his company may be able to provide light duty for him. He is undecided about having surgery at this time, but has not ruled it out. He was not interested in the clinical trial SWOG S1505.  Merceda Elks, RN, BSN GI Oncology [place]

## 2016-03-17 NOTE — Progress Notes (Signed)
[facility] Psychosocial Distress Screening Clinical Social Work  Clinical Social Work met with pt and his wife, [name] at [facility] to introduce self, explain role of CSW/Pt and Family Support Team and to review the distress screening protocol. Clinical Social Work was referred by distress screening protocol.  The patient scored a 1 on the Psychosocial Distress Thermometer which indicates no distress. Clinical Social Worker reviewed distress screen to assess for distress and other psychosocial needs. 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 He shared concerns about not being able to work as much as he would like and hopes he can try to work some during his treatment. He had several questions related to his employment. CSW educated him about Tourist information centre manager as a good source of info as to how to navigate work and cancer. Both wife and pt work full-time and are not currently concerned about finances. CSW briefly reviewed financial resources. CSW reviewed options and programs to reduce boredom that are available at [facility]. Pt and wife denied other concerns and agree to reach out if needs arise.   ONCBCN DISTRESS SCREENING 03/17/2016  Screening Type Initial Screening  Distress experienced in past week (1-10) 1  Practical problem type Work/school  Emotional problem type Boredom    Clinical Social Worker follow up needed: No.  If yes, follow up plan: Loren Racer, [place]  [facility] Phone: [phone] Fax: [phone]

## 2016-03-17 NOTE — Progress Notes (Signed)
[facility]  Telephone:([phone] Fax:([phone] Reader Note   Patient Care Team: Colton Morale, MD as PCP - General 03/17/2016  CHIEF COMPLAINTS/PURPOSE OF CONSULTATION:  Newly diagnosed pancreatic cancer  Oncology History   Adenocarcinoma of head of pancreas [facility])   Staging form: Pancreas, AJCC 7th Edition   - Clinical stage from 03/09/2016: Stage IIB (T2, N1, M0) - Signed by Colton Merle, MD on 03/17/2016      Adenocarcinoma of head of pancreas ([facility])   02/27/2016 Imaging    CT chest, abdomen and pelvis with contrast showed ill-defined heterogeneity of pancreatic head, highly suspicious for malignancy, ) quit about and biliary ductal dilatation, mildly prominent lymph nodes in the upper abdomen, largest 2 cm in the portocaval . No other metastasis.       03/09/2016 Initial Diagnosis    Adenocarcinoma of head of pancreas ([facility])      03/09/2016 Procedure    Upper EUS showed a 3.5 cm irregular mass in the pancreatic head, causing pancreatic and biliary duct obstruction. The mass involves the portal vein 422 mm, strongly suggesting invasion. There is suspicious nearby adenopathy      03/09/2016 Initial Biopsy    Fine-needle aspiration of the pancreatic mass from EUS showed malignant cells consistent with adenocarcinoma.        HISTORY OF PRESENTING ILLNESS:  Colton Bush 60 y.o. male is here because of His newly diagnosed pancreatic cancer. He is accompanied by his wife to our multidisciplinary GI clinic today.  He noticed fatigue, anorexia and weight loss about 50bs in the past 4 months. It started when his endocrinologist changed his diabetic meds. He presented to hospital on 7/30 with jaundice. No pain or nausea. CT scan revealed a heterogeneous pancreatic head mass, highly suspicious for malignancy. Mild biliary and pancreatic duct dilatation, mildly prominent lymph nodes in the upper abdomen. He underwent ERCP and CBD stent placement, and  subsequent upper EUS on 03/09/2016, which showed a 3.5 cm irregular mass in the pancreatic head, the mass involves the portal vein, no suspicious nearby adenopathy.  He has been feeling better after stent placement, with improved appetite and energy level. He feels normal again. No symptoms. BM is good, he has gained about 8lbs back.   MEDICAL HISTORY:  Past Medical History:  Diagnosis Date  . Arthritis    left hand  . Bronchitis 1977  . Depression    takes Cymbalta daily  . Diabetes mellitus type II    sees Dr. Dwyane Bush   . GERD (gastroesophageal reflux disease)    takes Omeprazole daily  . H/O hiatal hernia   . Hyperlipidemia    takes Zocor daily  . Hypertension    takes Amlodipine daily  . Neck pain    C4-7 stenosis and herniated disc  . Neuromuscular disorder ([facility])    hiatal hernia  . Scoliosis    slight  . Spinal cord injury, C5-C7 ([facility])    c4-c7  . Stiffness of hand joint    d/t cervical issues    SURGICAL HISTORY: Past Surgical History:  Procedure Laterality Date  . ANTERIOR CERVICAL DECOMP/DISCECTOMY FUSION  08/18/2011   Procedure: ANTERIOR CERVICAL DECOMPRESSION/DISCECTOMY FUSION 3 LEVELS;  Surgeon: Colton Cunas, MD;  Location: [facility];  Service: Neurosurgery;  Laterality: N/A;  Anterior Cervical Four-Five/Five-Six/Six-Seven Decompression with Fusion, Plating, and Bonegraft  . CARPAL TUNNEL RELEASE  2013   bilateral, per Dr. Christella Bush   . COLONOSCOPY  10-30-14  per Dr. Olevia Bush, clear, repeat in 10 yrs   . egd with esophageal dilation  9-08   per Dr. Olevia Bush  . ERCP N/A 03/01/2016   Procedure: ENDOSCOPIC RETROGRADE CHOLANGIOPANCREATOGRAPHY (ERCP) with brushings and stent;  Surgeon: Colton Stabler, MD;  Location: [facility];  Service: [facility];  Laterality: N/A;  . EUS N/A 03/09/2016   Procedure: ESOPHAGEAL ENDOSCOPIC ULTRASOUND (EUS) RADIAL;  Surgeon: Colton Banister, MD;  Location: [facility];  Service: Bush;  Laterality: N/A;  . lymph nodes biopsy      . melanoma rt calf  1999  . SPINE SURGERY    . TONSILLECTOMY     as a child  . ULNAR TUNNEL RELEASE  2013   right arm, per Dr. Christella Bush   . UPPER GASTROINTESTINAL Bush      SOCIAL HISTORY: Social History   Social History  . Marital status: Married    Spouse name: Colton Bush  . Number of children: N/A  . Years of education: N/A   Occupational History  . Driver    Social History Main Topics  . Smoking status: Never Smoker  . Smokeless tobacco: Never Used     Comment: tried a pipe 35 years ago   . Alcohol use No  . Drug use: No  . Sexual activity: Yes   Other Topics Concern  . Not on file   Social History Narrative   Married, wife Colton Bush   Colton Nutritional therapist-   He works as a Colton Bush for a company  He has one son 14 yo   FAMILY HISTORY: Family History  Problem Relation Age of Onset  . Heart disease Father   . Heart disease Brother 31  . Anesthesia problems Mother   . Heart disease Mother   . Dementia Mother   . Diabetes Sister   . Stroke Sister   . Colon cancer Neg Hx   . Rectal cancer Neg Hx   . Stomach cancer Neg Hx     ALLERGIES:  has No Known Allergies.  MEDICATIONS:  Current Outpatient Prescriptions  Medication Sig Dispense Refill  . amLODipine (Colton Bush) 10 MG tablet Take 1 tablet (10 mg total) by mouth daily. 30 tablet 1  . benazepril ([facility]ton Bush) 10 MG tablet Take 1 tablet (10 mg total) by mouth daily. 30 tablet 1  . insulin glargine (Colton Bush) 100 UNIT/ML injection Inject 0.2 mLs (20 Units total) into the skin daily. 10 mL 11  . Insulin Pen Needle 30G X 5 MM MISC Use one daily with insulin 100 each 2  . Lancets (Colton Bush) lancets USE AS INSTRUCTED TO CHECK BLOOD SUGAR ONCE A DAY 100 each 1  . Multiple Vitamin (MULTIVITAMIN WITH MINERALS) TABS tablet Take 1 tablet by mouth daily.    Marland Kitchen omega-3 acid ethyl esters (LOVAZA) 1 g capsule Take 2 g by mouth daily.    Marland Kitchen omeprazole (PRILOSEC) 20 MG capsule Take 20 mg by mouth daily.    Marland Kitchen docusate sodium (COLACE)  100 MG capsule Take 1 capsule (100 mg total) by mouth 2 (two) times daily. (Patient not taking: Reported on 03/17/2016) 10 capsule 0  . Insulin Detemir (LEVEMIR) 100 UNIT/ML Pen Inject 20 units into the skin each evening. (Patient not taking: Reported on 03/10/2016) 30 mL 3  . oxyCODONE (OXY IR/ROXICODONE) 5 MG immediate release tablet Take 1 tablet (5 mg total) by mouth every 4 (four) hours as needed for moderate pain. (Patient not taking: Reported on 03/10/2016) 10 tablet 0  . polyethylene glycol (MIRALAX /  GLYCOLAX) packet Take 17 g by mouth daily as needed for mild constipation. (Patient not taking: Reported on 03/17/2016) 14 each 0   No current facility-administered medications for this visit.     REVIEW OF SYSTEMS:   Constitutional: Denies fevers, chills or abnormal night sweats Eyes: Denies blurriness of vision, double vision or watery eyes Ears, nose, mouth, throat, and face: Denies mucositis or sore throat Respiratory: Denies cough, dyspnea or wheezes Cardiovascular: Denies palpitation, chest discomfort or lower extremity swelling Gastrointestinal:  Denies nausea, heartburn or change in bowel habits Skin: Denies abnormal skin rashes Lymphatics: Denies new lymphadenopathy or easy bruising Neurological:Denies numbness, tingling or new weaknesses Behavioral/Psych: Mood is stable, no new changes  All other systems were reviewed with the patient and are negative.  PHYSICAL EXAMINATION: ECOG PERFORMANCE STATUS: 0 - Asymptomatic  Vitals:   03/17/16 0838  BP: 135/73  Pulse: 75  Resp: 17  Temp: 98.5 F (36.9 C)   Filed Weights   03/17/16 0838  Weight: 183 lb 11.2 oz (83.3 kg)    GENERAL:alert, no distress and comfortable SKIN: skin color, texture, turgor are normal, no rashes or significant lesions EYES: normal, conjunctiva are pink and non-injected, sclera clear OROPHARYNX:no exudate, no erythema and lips, buccal mucosa, and tongue normal  NECK: supple, thyroid normal size,  non-tender, without nodularity LYMPH:  no palpable lymphadenopathy in the cervical, axillary or inguinal LUNGS: clear to auscultation and percussion with normal breathing effort HEART: regular rate & rhythm and no murmurs and no lower extremity edema ABDOMEN:abdomen soft, non-tender and normal bowel sounds Musculoskeletal:no cyanosis of digits and no clubbing  PSYCH: alert & oriented x 3 with fluent speech NEURO: no focal motor/sensory deficits  LABORATORY DATA:  I have reviewed the data as listed CBC Latest Ref Rng & Units 03/02/2016 02/29/2016 02/28/2016  WBC 4.0 - 10.5 K/uL 5.0 8.2 12.5(H)  Hemoglobin 13.0 - 17.0 g/dL 11.1(L) 10.4(L) 11.1(L)  Hematocrit 39.0 - 52.0 % 33.7(L) 31.5(L) 34.4(L)  Platelets 150 - 400 K/uL 347 311 366   CMP Latest Ref Rng & Units 03/02/2016 03/01/2016 02/29/2016  Glucose 65 - 99 mg/dL 180(H) 216(H) 181(H)  BUN 6 - 20 mg/dL 15 13 17   Creatinine 0.61 - 1.24 mg/dL <0.30(L) 0.41(L) 0.61  Sodium 135 - 145 mmol/L 133(L) 133(L) 131(L)  Potassium 3.5 - 5.1 mmol/L 3.2(L) 3.7 3.4(L)  Chloride 101 - 111 mmol/L 100(L) 104 103  CO2 22 - 32 mmol/L 26 19(L) 16(L)  Calcium 8.9 - 10.3 mg/dL 8.6(L) 8.7(L) 8.3(L)  Total Protein 6.5 - 8.1 g/dL 6.1(L) 6.0(L) 6.0(L)  Total Bilirubin 0.3 - 1.2 mg/dL 3.8(H) 7.8(H) 7.6(H)  Alkaline Phos 38 - 126 U/L 631(H) 704(H) 665(H)  AST 15 - 41 U/L 44(H) 137(H) 109(H)  ALT 17 - 63 U/L 168(H) 224(H) 225(H)   PATHOLOGY REPORT  Diagnosis 03/09/2016 FINE NEEDLE ASPIRATION, ENDOSCOPIC, PANCREAS (SPECIMEN 1 OF 1 COLLECTED 03/09/16): MALIGNANT CELLS CONSISTENT WITH ADENOCARCINOMA. BACKGROUND NECROTIC DEBRIS AND ACUTE INFLAMMATION.  Diagnosis 03/01/2016 BILE DUCT BRUSHING(SPECIMEN 1 OF 1 COLLECTED 03/01/16): RARE ATYPICAL CELLS, SEE COMMENT.  RADIOGRAPHIC STUDIES: I have personally reviewed the radiological images as listed and agreed with the findings in the report. Ct Chest W Contrast  Result Date: 02/27/2016 CLINICAL DATA:  Weight loss EXAM: CT  CHEST, ABDOMEN, AND PELVIS WITH CONTRAST TECHNIQUE: Multidetector CT imaging of the chest, abdomen and pelvis was performed following the standard protocol during bolus administration of intravenous contrast. CONTRAST:  143mL ISOVUE-300 IOPAMIDOL (ISOVUE-300) INJECTION 61% COMPARISON:  None. FINDINGS: CT CHEST  FINDINGS Mediastinum/Lymph Nodes: Heart size is normal. No pericardial effusion. Thoracic aorta is normal in caliber and configuration. No mass or enlarged lymph nodes seen within the mediastinum or perihilar regions. Trachea and central bronchi are unremarkable. Lungs/Pleura: Lungs are clear. No pulmonary nodule or mass. No consolidation. No pleural effusion or pneumothorax. Musculoskeletal: Dextroscoliosis of the thoracic spine, measuring approximately 25 degrees. Mild degenerative change within the thoracic spine. No acute or suspicious osseous finding. Superficial soft tissues are unremarkable. CT ABDOMEN PELVIS FINDINGS Hepatobiliary: Diffuse intrahepatic bile duct dilatation. Common bile duct distended to approximately 1.5 cm. No focal enhancing mass or lesion identified within the liver. Gallbladder is moderately distended but otherwise unremarkable. Pancreas: Pancreatic duct dilatation, dilated up to approximately 1.2 cm. Heterogeneity of the pancreatic head, ill-defined, almost certainly representing a pancreatic carcinoma. Spleen: Within normal limits in size and appearance. Adrenals/Urinary Tract: Adrenal glands appear normal. Kidneys appear normal without stone or hydronephrosis. Stomach/Bowel: Bowel is normal in caliber. No bowel wall thickening or evidence of bowel wall inflammation. Appendix is normal. Vascular/Lymphatic: Mildly prominent lymph nodes within the upper abdomen, including a 2 cm short axis lymph node in the portacaval space. No enlarged lymph nodes elsewhere in the abdomen or pelvis. Mild atherosclerotic changes of the normal caliber abdominal aorta. Reproductive: No mass or other  significant abnormality. Other: No free fluid or abscess collection identified. No free intraperitoneal air. Musculoskeletal: Degenerative changes throughout the thoracolumbar spine, moderate in degree. No acute or suspicious osseous finding. Sclerotic lesion within the left iliac bone is most likely a benign bone island. Superficial soft tissues are unremarkable. IMPRESSION: 1. Heterogeneity of the pancreatic head, ill-defined limiting measurement, almost certainly representing a pancreatic carcinoma given the combination of bile duct dilatation and pancreatic duct dilatation. Recommend pancreas MRI for further characterization. 2. Mildly prominent lymph nodes within the upper abdomen, suspicious for local metastatic lymphadenopathy, particularly prominent lymph node in the portacaval space measuring 2 cm short axis dimension. No enlarged lymph nodes elsewhere in the abdomen or pelvis. No evidence of distant metastasis. 3. Normal chest CT. 4. Scoliosis. No evidence of osseous metastasis. Probable bone island within the left iliac bone. These results were called by telephone at the time of interpretation on 02/27/2016 at 7:15 pm to Dr. Quintella Reichert , who verbally acknowledged these results. Electronically Signed   By: Franki Cabot M.D.   On: 02/27/2016 19:23  Ct Abdomen Pelvis W Contrast  Result Date: 02/27/2016 CLINICAL DATA:  Weight loss EXAM: CT CHEST, ABDOMEN, AND PELVIS WITH CONTRAST TECHNIQUE: Multidetector CT imaging of the chest, abdomen and pelvis was performed following the standard protocol during bolus administration of intravenous contrast. CONTRAST:  179mL ISOVUE-300 IOPAMIDOL (ISOVUE-300) INJECTION 61% COMPARISON:  None. FINDINGS: CT CHEST FINDINGS Mediastinum/Lymph Nodes: Heart size is normal. No pericardial effusion. Thoracic aorta is normal in caliber and configuration. No mass or enlarged lymph nodes seen within the mediastinum or perihilar regions. Trachea and central bronchi are  unremarkable. Lungs/Pleura: Lungs are clear. No pulmonary nodule or mass. No consolidation. No pleural effusion or pneumothorax. Musculoskeletal: Dextroscoliosis of the thoracic spine, measuring approximately 25 degrees. Mild degenerative change within the thoracic spine. No acute or suspicious osseous finding. Superficial soft tissues are unremarkable. CT ABDOMEN PELVIS FINDINGS Hepatobiliary: Diffuse intrahepatic bile duct dilatation. Common bile duct distended to approximately 1.5 cm. No focal enhancing mass or lesion identified within the liver. Gallbladder is moderately distended but otherwise unremarkable. Pancreas: Pancreatic duct dilatation, dilated up to approximately 1.2 cm. Heterogeneity of the pancreatic head, ill-defined,  almost certainly representing a pancreatic carcinoma. Spleen: Within normal limits in size and appearance. Adrenals/Urinary Tract: Adrenal glands appear normal. Kidneys appear normal without stone or hydronephrosis. Stomach/Bowel: Bowel is normal in caliber. No bowel wall thickening or evidence of bowel wall inflammation. Appendix is normal. Vascular/Lymphatic: Mildly prominent lymph nodes within the upper abdomen, including a 2 cm short axis lymph node in the portacaval space. No enlarged lymph nodes elsewhere in the abdomen or pelvis. Mild atherosclerotic changes of the normal caliber abdominal aorta. Reproductive: No mass or other significant abnormality. Other: No free fluid or abscess collection identified. No free intraperitoneal air. Musculoskeletal: Degenerative changes throughout the thoracolumbar spine, moderate in degree. No acute or suspicious osseous finding. Sclerotic lesion within the left iliac bone is most likely a benign bone island. Superficial soft tissues are unremarkable. IMPRESSION: 1. Heterogeneity of the pancreatic head, ill-defined limiting measurement, almost certainly representing a pancreatic carcinoma given the combination of bile duct dilatation and  pancreatic duct dilatation. Recommend pancreas MRI for further characterization. 2. Mildly prominent lymph nodes within the upper abdomen, suspicious for local metastatic lymphadenopathy, particularly prominent lymph node in the portacaval space measuring 2 cm short axis dimension. No enlarged lymph nodes elsewhere in the abdomen or pelvis. No evidence of distant metastasis. 3. Normal chest CT. 4. Scoliosis. No evidence of osseous metastasis. Probable bone island within the left iliac bone. These results were called by telephone at the time of interpretation on 02/27/2016 at 7:15 pm to Dr. Quintella Reichert , who verbally acknowledged these results. Electronically Signed   By: Franki Cabot M.D.   On: 02/27/2016 19:23  Dg Ercp With Sphincterotomy  Result Date: 03/01/2016 CLINICAL DATA:  Jaundice, biliary ductal dilatation and pancreatic ductal dilatation on recent CT. EXAM: ERCP TECHNIQUE: Multiple spot images obtained with the fluoroscopic device and submitted for interpretation post-procedure. FLUOROSCOPY TIME:  3.58 minutes, 58 mGy COMPARISON:  CT 02/27/2016 FINDINGS: A series of 5 fluoroscopic spot images document endoscopic catheterization and opacification of the biliary tree. There is irregular narrowing in the distal CBD. There is dilatation of the proximal CBD. The intrahepatic biliary tree is incompletely opacified, appearing dilated centrally. Subsequent images document placement of a plastic endoscopic stent across the distal CBD lesion. IMPRESSION: 1. Distal CBD stenotic lesion with proximal dilatation, and subsequent endoscopic stent placement. These images were submitted for radiologic interpretation only. Please see the procedural report for the amount of contrast and the fluoroscopy time utilized. Electronically Signed   By: Lucrezia Europe M.D.   On: 03/01/2016 13:34   EUS 03/09/2016 Dr. Ardis Hughs  - 3.5cm irregularly shaped, poorly defined mass in the pancreatic head. There is suspicious, nearby  adenopathy. The mass is causing pancreatic and bililary duct obstruction (previously stented). The mass involves the portal vein for 62mm (abuttment and loss of usual tissue interface), strongly suggesting invasion. Preliminary cytology results are positive for malignancy (adenocarcinoma), await final report.  ASSESSMENT & PLAN: 60 year old Caucasian male, with past medical history of diabetes and hypertension, presented with epigastric pain, weight loss, and jaundice.  1. Primary pancreatic adenocarcinoma, in pancreas head, cT2N1M0, stage IIB, borderline resectable  -I have reviewed his CT abdomen and pelvis with contrast, EUS and biopsy findings with patient and his wife in great details. -His case was reviewed in our GI tumor board a few days ago. CT scan and US showed possible portal vein invasion from the pancreatic tumor, this is borderline resectable disease. No evidence of distant metastasis.  -We reviewed the nature history of pancreatic  cancer, and the overall survival rate with chemotherapy, surgery, and radiation. Patient and his wife was discouraged by the overall dismal long term survival rate (~20%)  -He was seen by surgeon Dr. Barry Dienes today, Whipple surgery was discussed and offered to patient.  -I reviewed the high risk of cancer recurrence after surgery, and the role of neoadjuvant and adjuvant chemotherapy to reduce the risk of recurrence, and shrink the tumor before surgery. -I discussed with Dr. Barry Dienes, we recommend patient to have new adjuvant chemotherapy first, and a possible SBRT after chemo, based on his next staging scan findings after chemo.  -We discussed different neoadjuvant chemotherapy regimens, especially FOLFIRINOX or gemcitabine and Abraxane combination. Giving his much improved symptoms after his stent placement, good performance status, I recommend him to have FOLFIRINOX.  --Chemotherapy consent: Side effects including but does not not limited to, fatigue, nausea,  vomiting, diarrhea, hair loss, neuropathy, fluid retention, renal and kidney dysfunction, neutropenic fever, needed for blood transfusion, bleeding, were discussed with patient in great detail. He agrees to proceed. -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 that the goal of therapy is curative  -He will need a port placement by Dr. Barry Dienes, and we tentatively plan to start his chemotherapy on August 28.  2. HTN and DM - he will continue medication and follow up with his primary care physician  -We reviewed her that his blood pressure and blood glucose will need to be monitored closely during the chemotherapy, and his medication may need to be adjusted   Plan -Repeat lab today -Chemotherapy class next week -He will have a port placement by Dr. Barry Dienes on August 28, and start first cycle chemotherapy FOLFIRINOX on 8/28. I will see him before his chemotherapy.   Orders Placed This Encounter  Procedures  . CBC with Differential    Standing Status:   Standing    Number of Occurrences:   40    Standing Expiration Date:   03/17/2021  . Comprehensive metabolic panel    Standing Status:   Standing    Number of Occurrences:   40    Standing Expiration Date:   03/17/2021  . CA 19.9    Standing Status:   Standing    Number of Occurrences:   40    Standing Expiration Date:   03/17/2021    All questions were answered. The patient knows to call the clinic with any problems, questions or concerns. I spent 55 minutes counseling the patient face to face. The total time spent in the appointment was 60 minutes and more than 50% was on counseling.     Colton Merle, MD 03/17/2016 5:58 PM

## 2016-03-17 NOTE — Telephone Encounter (Signed)
"  I'm home and just realized I was to go to the lab.  I left the scheduling desk and was not directed to go to the lab.  Do I need to come back?"Verbal order received and read back from Dr. [name] for labs to be done before chemotherapy education class .  Order given to patient at this time.  Scheduling note added for schedulers.

## 2016-03-17 NOTE — Progress Notes (Signed)
Patient was seen in GI clinic with wife.  60 year old male diagnosed with cancer of the pancreas.  He is a patient of Dr. [name].  Past medical history includes scoliosis, hypertension, hyperlipidemia, hiatal hernia, GERD, diabetes type 2, and depression.  Medications include Colace, Lantus, MiraLAX, multivitamin, and Prilosec.  Labs include sodium 133, potassium 3.2, glucose 180, and albumin 2.5 on August 3.  Height: 5 feet 10 inches. Weight: 183.7 pounds. Usual body weight: 227 pounds.  Afebrile 6 2017. BMI: 26.36.  Patient endorses 44 pound weight loss over the past 4 months. Patient's wife has been quite restrictive with diabetic restrictions hoping to keep blood sugars in control. Patient denies current nutrition impact symptoms.  Nutrition diagnosis: Provided brief education on eating with diabetes and encouraged patient to consume regular foods but control overall carbohydrate intake. Encouraged patient to find oral nutrition supplements that he will tolerate Recommended small frequent meals with 3 snacks in between. Questions were answered.  Teach back method used and contact information was given.  Monitoring, evaluation, goals: Patient will tolerate adequate calories and protein to promote weight stabilization.  Next visit: Tuesday, August 29, during infusion.  **Disclaimer: This note was dictated with voice recognition software. Similar sounding words can inadvertently be transcribed and this note may contain transcription errors which may not have been corrected upon publication of note.**

## 2016-03-18 ENCOUNTER — Telehealth: Payer: Self-pay | Admitting: Family Medicine

## 2016-03-18 NOTE — Telephone Encounter (Signed)
Pt came back into the office. Questioned exactly what was needed. Patient states that his insurance does not cover the Lantus but covers the Levemir.   [facility] [address] confirmed the same information.   Please advise if change is appropriate.

## 2016-03-18 NOTE — Telephone Encounter (Signed)
Relation to PO:[id number] Call back number:[phone] [id number] [place], [address] [facility] [address] [phone] (Phone) [phone] (Fax)     Reason for call:  Patient states insurance doesn't cover Lancets (FREESTYLE) lancets requesting an alternate levemir

## 2016-03-20 ENCOUNTER — Other Ambulatory Visit: Payer: Self-pay

## 2016-03-20 NOTE — Telephone Encounter (Signed)
Yes, please change from Lantus to Levemir, use the same dosing, refills, etc.

## 2016-03-21 ENCOUNTER — Encounter (HOSPITAL_COMMUNITY)
Admission: RE | Admit: 2016-03-21 | Discharge: 2016-03-21 | Disposition: A | Discharge status: Home / Self Care | Payer: 59 | Source: Ambulatory Visit | Attending: General Surgery | Admitting: General Surgery

## 2016-03-21 ENCOUNTER — Encounter: Payer: Self-pay | Admitting: Hematology

## 2016-03-21 ENCOUNTER — Encounter (HOSPITAL_COMMUNITY): Payer: Self-pay

## 2016-03-21 DIAGNOSIS — Z794 Long term (current) use of insulin: Secondary | ICD-10-CM | POA: Diagnosis not present

## 2016-03-21 DIAGNOSIS — E119 Type 2 diabetes mellitus without complications: Secondary | ICD-10-CM | POA: Diagnosis not present

## 2016-03-21 DIAGNOSIS — F329 Major depressive disorder, single episode, unspecified: Secondary | ICD-10-CM | POA: Diagnosis not present

## 2016-03-21 DIAGNOSIS — K219 Gastro-esophageal reflux disease without esophagitis: Secondary | ICD-10-CM | POA: Diagnosis not present

## 2016-03-21 DIAGNOSIS — I10 Essential (primary) hypertension: Secondary | ICD-10-CM | POA: Diagnosis not present

## 2016-03-21 DIAGNOSIS — C250 Malignant neoplasm of head of pancreas: Secondary | ICD-10-CM | POA: Diagnosis present

## 2016-03-21 DIAGNOSIS — Z85820 Personal history of malignant melanoma of skin: Secondary | ICD-10-CM | POA: Diagnosis not present

## 2016-03-21 DIAGNOSIS — Z79899 Other long term (current) drug therapy: Secondary | ICD-10-CM | POA: Diagnosis not present

## 2016-03-21 DIAGNOSIS — E785 Hyperlipidemia, unspecified: Secondary | ICD-10-CM | POA: Diagnosis not present

## 2016-03-21 HISTORY — DX: Malignant (primary) neoplasm, unspecified: C80.1

## 2016-03-21 LAB — BASIC METABOLIC PANEL
Anion gap: 9 (ref 5–15)
BUN: 17 mg/dL (ref 6–20)
CO2: 25 mmol/L (ref 22–32)
Calcium: 9.5 mg/dL (ref 8.9–10.3)
Chloride: 102 mmol/L (ref 101–111)
Creatinine, Ser: 0.5 mg/dL — ABNORMAL LOW (ref 0.61–1.24)
GFR calc Af Amer: >60 mL/min (ref >60)
GFR calc non Af Amer: >60 mL/min (ref >60)
Glucose, Bld: 329 mg/dL — ABNORMAL HIGH (ref 65–99)
Potassium: 3.8 mmol/L (ref 3.5–5.1)
Sodium: 136 mmol/L (ref 135–145)

## 2016-03-21 LAB — CBC
HCT: 35.4 % — ABNORMAL LOW (ref 39.0–52.0)
Hemoglobin: 11.9 g/dL — ABNORMAL LOW (ref 13.0–17.0)
MCH: 33.8 pg (ref 26.0–34.0)
MCHC: 33.6 g/dL (ref 30.0–36.0)
MCV: 100.6 fL — ABNORMAL HIGH (ref 78.0–100.0)
Platelets: 322 10*3/uL (ref 150–400)
RBC: 3.52 MIL/uL — ABNORMAL LOW (ref 4.22–5.81)
RDW: 13.8 % (ref 11.5–15.5)
WBC: 7.1 10*3/uL (ref 4.0–10.5)

## 2016-03-21 NOTE — Patient Instructions (Addendum)
JAQUAVEON VANOS  03/21/2016   Your procedure is scheduled on: Wednesday 03/22/2016  Report to [facility] Main  Entrance take [place]  elevators to 3rd floor to  [place] at   0800  AM.  Call this number if you have problems the morning of surgery [phone]   Remember: ONLY 1 PERSON MAY GO WITH YOU TO SHORT STAY TO GET  READY MORNING OF [place].   Do not eat food or drink liquids :After Midnight.     Take these medicines the morning of surgery with A SIP OF WATER: Amlodipine               DO NOT TAKE ANY DIABETIC MEDICATIONS DAY OF YOUR SURGERY!                               You may not have any metal on your body including hair pins and              piercings  Do not wear jewelry, make-up, lotions, powders or perfumes, deodorant             Do not wear nail polish.  Do not shave  48 hours prior to surgery.              Men may shave face and neck.   Do not bring valuables to the hospital. [place].  Contacts, dentures or bridgework may not be worn into surgery.  Leave suitcase in the car. After surgery it may be brought to your room.     Patients discharged the day of surgery will not be allowed to drive home.  Name and phone number of your driver:spouse- [name]  Special Instructions:  [facility] - Preparing for Surgery Before surgery, you can play an important role.  Because skin is not sterile, your skin needs to be as free of germs as possible.  You can reduce the number of germs on your skin by washing with CHG (chlorahexidine gluconate) soap before surgery.  CHG is an antiseptic cleaner which kills germs and bonds with the skin to continue killing germs even after washing. Please DO NOT use if you have an allergy to CHG or antibacterial soaps.  If your skin becomes reddened/irritated stop using the CHG and inform your nurse when you arrive at Short Stay. Do not shave (including legs and  underarms) for at least 48 hours prior to the first CHG shower.  You may shave your face/neck. Please follow these instructions carefully:  1.  Shower with CHG Soap the night before surgery and the  morning of Surgery.  2.  If you choose to wash your hair, wash your hair first as usual with your  normal  shampoo.  3.  After you shampoo, rinse your hair and body thoroughly to remove the  shampoo.                           4.  Use CHG as you would any other liquid soap.  You can apply chg directly  to the skin and wash  Gently with a scrungie or clean washcloth.  5.  Apply the CHG Soap to your body ONLY FROM THE NECK DOWN.   Do not use on face/ open                           Wound or open sores. Avoid contact with eyes, ears mouth and genitals (private parts).                       Wash face,  Genitals (private parts) with your normal soap.             6.  Wash thoroughly, paying special attention to the area where your surgery  will be performed.  7.  Thoroughly rinse your body with warm water from the neck down.  8.  DO NOT shower/wash with your normal soap after using and rinsing off  the CHG Soap.                9.  Pat yourself dry with a clean towel.            10.  Wear clean pajamas.            11.  Place clean sheets on your bed the night of your first shower and do not  sleep with pets. Day of Surgery : Do not apply any lotions/deodorants the morning of surgery.  Please wear clean clothes to the hospital/surgery center.  FAILURE TO FOLLOW THESE INSTRUCTIONS MAY RESULT IN THE CANCELLATION OF YOUR SURGERY PATIENT SIGNATURE_________________________________  NURSE SIGNATURE__________________________________  ________________________________________________________________________    Please read over the following fact sheets you were given:

## 2016-03-21 NOTE — Progress Notes (Signed)
[place] forms left in box. 03/17/16. I left for dr. [name] to sign

## 2016-03-22 ENCOUNTER — Encounter (HOSPITAL_COMMUNITY): Payer: Self-pay

## 2016-03-22 ENCOUNTER — Encounter: Payer: Self-pay | Admitting: Hematology

## 2016-03-22 ENCOUNTER — Encounter (HOSPITAL_COMMUNITY)
Admission: RE | Disposition: A | Discharge status: Home / Self Care | Payer: Self-pay | Source: Ambulatory Visit | Attending: General Surgery

## 2016-03-22 ENCOUNTER — Ambulatory Visit (HOSPITAL_COMMUNITY): Payer: 59

## 2016-03-22 ENCOUNTER — Ambulatory Visit (HOSPITAL_COMMUNITY): Payer: 59 | Admitting: Anesthesiology

## 2016-03-22 ENCOUNTER — Ambulatory Visit (HOSPITAL_COMMUNITY)
Admission: RE | Admit: 2016-03-22 | Discharge: 2016-03-22 | Disposition: A | Discharge status: Home / Self Care | Payer: 59 | Source: Ambulatory Visit | Attending: General Surgery | Admitting: General Surgery

## 2016-03-22 DIAGNOSIS — C250 Malignant neoplasm of head of pancreas: Secondary | ICD-10-CM | POA: Diagnosis not present

## 2016-03-22 DIAGNOSIS — Z85820 Personal history of malignant melanoma of skin: Secondary | ICD-10-CM | POA: Insufficient documentation

## 2016-03-22 DIAGNOSIS — E119 Type 2 diabetes mellitus without complications: Secondary | ICD-10-CM | POA: Insufficient documentation

## 2016-03-22 DIAGNOSIS — K219 Gastro-esophageal reflux disease without esophagitis: Secondary | ICD-10-CM | POA: Insufficient documentation

## 2016-03-22 DIAGNOSIS — F329 Major depressive disorder, single episode, unspecified: Secondary | ICD-10-CM | POA: Insufficient documentation

## 2016-03-22 DIAGNOSIS — I10 Essential (primary) hypertension: Secondary | ICD-10-CM | POA: Insufficient documentation

## 2016-03-22 DIAGNOSIS — Z794 Long term (current) use of insulin: Secondary | ICD-10-CM | POA: Insufficient documentation

## 2016-03-22 DIAGNOSIS — Z95828 Presence of other vascular implants and grafts: Secondary | ICD-10-CM

## 2016-03-22 DIAGNOSIS — E785 Hyperlipidemia, unspecified: Secondary | ICD-10-CM | POA: Insufficient documentation

## 2016-03-22 DIAGNOSIS — Z79899 Other long term (current) drug therapy: Secondary | ICD-10-CM | POA: Insufficient documentation

## 2016-03-22 HISTORY — PX: PORTACATH PLACEMENT: SHX2246

## 2016-03-22 LAB — GLUCOSE, CAPILLARY
Glucose-Capillary: 343 mg/dL — ABNORMAL HIGH (ref 65–99)
Glucose-Capillary: 269 mg/dL — ABNORMAL HIGH (ref 65–99)
Glucose-Capillary: 201 mg/dL — ABNORMAL HIGH (ref 65–99)

## 2016-03-22 SURGERY — INSERTION, TUNNELED CENTRAL VENOUS DEVICE, WITH PORT
Anesthesia: General | Laterality: Left

## 2016-03-22 MED ORDER — PHENYLEPHRINE 40 MCG/ML (10ML) SYRINGE FOR IV PUSH (FOR BLOOD PRESSURE SUPPORT)
PREFILLED_SYRINGE | INTRAVENOUS | Status: AC
  Filled 2016-03-22: qty 10

## 2016-03-22 MED ORDER — SODIUM CHLORIDE 0.9% FLUSH: 3.0000 mL | INTRAVENOUS | Status: DC | PRN

## 2016-03-22 MED ORDER — LACTATED RINGERS IV SOLN
INTRAVENOUS | Status: DC
  Administered 2016-03-22: 10:00:00 via INTRAVENOUS

## 2016-03-22 MED ORDER — ACETAMINOPHEN 650 MG RE SUPP: 650.0000 mg | RECTAL | Status: DC | PRN

## 2016-03-22 MED ORDER — PROPOFOL 10 MG/ML IV BOLUS
INTRAVENOUS | Status: DC | PRN
  Administered 2016-03-22: 150 mg via INTRAVENOUS

## 2016-03-22 MED ORDER — INSULIN ASPART 100 UNIT/ML ~~LOC~~ SOLN
10.0000 [IU] | Freq: Once | SUBCUTANEOUS | Status: AC
Start: 2016-03-22 — End: 2016-03-22
  Administered 2016-03-22: 10 [IU] via SUBCUTANEOUS
  Filled 2016-03-22: qty 1

## 2016-03-22 MED ORDER — CEFAZOLIN SODIUM-DEXTROSE 2-4 GM/100ML-% IV SOLN
2.0000 g | INTRAVENOUS | Status: AC
  Administered 2016-03-22: 2 g via INTRAVENOUS
  Filled 2016-03-22: qty 100

## 2016-03-22 MED ORDER — CEFAZOLIN SODIUM-DEXTROSE 2-4 GM/100ML-% IV SOLN
INTRAVENOUS | Status: AC
  Filled 2016-03-22: qty 100

## 2016-03-22 MED ORDER — LIDOCAINE HCL 1 % IJ SOLN
INTRAMUSCULAR | Status: AC
  Filled 2016-03-22: qty 20

## 2016-03-22 MED ORDER — OXYCODONE HCL 5 MG PO TABS: 5.0000 mg | ORAL_TABLET | ORAL | Status: DC | PRN

## 2016-03-22 MED ORDER — SODIUM CHLORIDE 0.9% FLUSH: 3.0000 mL | Freq: Two times a day (BID) | INTRAVENOUS | Status: DC

## 2016-03-22 MED ORDER — CHLORHEXIDINE GLUCONATE CLOTH 2 % EX PADS: 6.0000 | MEDICATED_PAD | Freq: Once | CUTANEOUS | Status: DC

## 2016-03-22 MED ORDER — OXYCODONE HCL 5 MG PO TABS: 5.0000 mg | ORAL_TABLET | ORAL | 0 refills | Status: DC | PRN

## 2016-03-22 MED ORDER — FENTANYL CITRATE (PF) 100 MCG/2ML IJ SOLN
INTRAMUSCULAR | Status: DC | PRN
  Administered 2016-03-22 (×2): 25 ug via INTRAVENOUS

## 2016-03-22 MED ORDER — LIDOCAINE HCL 1 % IJ SOLN
INTRAMUSCULAR | Status: DC | PRN
  Administered 2016-03-22: 8 mL

## 2016-03-22 MED ORDER — BUPIVACAINE-EPINEPHRINE 0.25% -1:200000 IJ SOLN
INTRAMUSCULAR | Status: DC | PRN
  Administered 2016-03-22: 9 mL

## 2016-03-22 MED ORDER — HEPARIN SOD (PORK) LOCK FLUSH 100 UNIT/ML IV SOLN
INTRAVENOUS | Status: DC | PRN
  Administered 2016-03-22: 500 [IU]

## 2016-03-22 MED ORDER — ONDANSETRON HCL 4 MG/2ML IJ SOLN: 4.0000 mg | Freq: Once | INTRAMUSCULAR | Status: DC | PRN

## 2016-03-22 MED ORDER — SODIUM CHLORIDE 0.9 % IV SOLN
Freq: Once | INTRAVENOUS | Status: AC
  Administered 2016-03-22: 500 mL
  Filled 2016-03-22: qty 1.2

## 2016-03-22 MED ORDER — FENTANYL CITRATE (PF) 100 MCG/2ML IJ SOLN
INTRAMUSCULAR | Status: AC
  Filled 2016-03-22: qty 2

## 2016-03-22 MED ORDER — HEPARIN SOD (PORK) LOCK FLUSH 100 UNIT/ML IV SOLN
INTRAVENOUS | Status: AC
  Filled 2016-03-22: qty 5

## 2016-03-22 MED ORDER — ACETAMINOPHEN 325 MG PO TABS: 650.0000 mg | ORAL_TABLET | ORAL | Status: DC | PRN

## 2016-03-22 MED ORDER — LIDOCAINE HCL (CARDIAC) 20 MG/ML IV SOLN
INTRAVENOUS | Status: AC
  Filled 2016-03-22: qty 5

## 2016-03-22 MED ORDER — MIDAZOLAM HCL 2 MG/2ML IJ SOLN
INTRAMUSCULAR | Status: AC
  Filled 2016-03-22: qty 2

## 2016-03-22 MED ORDER — SODIUM CHLORIDE 0.9 % IV SOLN: 250.0000 mL | INTRAVENOUS | Status: DC | PRN

## 2016-03-22 MED ORDER — BUPIVACAINE-EPINEPHRINE (PF) 0.25% -1:200000 IJ SOLN
INTRAMUSCULAR | Status: AC
  Filled 2016-03-22: qty 30

## 2016-03-22 MED ORDER — ONDANSETRON HCL 4 MG/2ML IJ SOLN
INTRAMUSCULAR | Status: DC | PRN
  Administered 2016-03-22: 4 mg via INTRAVENOUS

## 2016-03-22 MED ORDER — FENTANYL CITRATE (PF) 100 MCG/2ML IJ SOLN: 25.0000 ug | INTRAMUSCULAR | Status: DC | PRN

## 2016-03-22 MED ORDER — ONDANSETRON HCL 4 MG/2ML IJ SOLN
INTRAMUSCULAR | Status: AC
Start: 2016-03-22 — End: 2016-03-22
  Filled 2016-03-22: qty 2

## 2016-03-22 MED ORDER — PROPOFOL 10 MG/ML IV BOLUS
INTRAVENOUS | Status: AC
  Filled 2016-03-22: qty 20

## 2016-03-22 MED ORDER — PHENYLEPHRINE HCL 10 MG/ML IJ SOLN
INTRAMUSCULAR | Status: DC | PRN
  Administered 2016-03-22: 80 ug via INTRAVENOUS
  Administered 2016-03-22 (×4): 40 ug via INTRAVENOUS

## 2016-03-22 MED ORDER — MIDAZOLAM HCL 5 MG/5ML IJ SOLN
INTRAMUSCULAR | Status: DC | PRN
  Administered 2016-03-22: 2 mg via INTRAVENOUS

## 2016-03-22 MED ORDER — LIDOCAINE HCL (CARDIAC) 20 MG/ML IV SOLN
INTRAVENOUS | Status: DC | PRN
  Administered 2016-03-22: 50 mg via INTRAVENOUS

## 2016-03-22 SURGICAL SUPPLY — 33 items
BAG DECANTER FOR FLEXI CONT (MISCELLANEOUS) ×2 IMPLANT
BLADE HEX COATED 2.75 (ELECTRODE) ×2 IMPLANT
BLADE SURG 15 STRL LF DISP TIS (BLADE) ×1 IMPLANT
BLADE SURG 15 STRL SS (BLADE) ×2
BLADE SURG SZ11 CARB STEEL (BLADE) ×2 IMPLANT
CHLORAPREP W/TINT 26ML (MISCELLANEOUS) ×2 IMPLANT
COVER SURGICAL LIGHT HANDLE (MISCELLANEOUS) ×2 IMPLANT
DECANTER SPIKE VIAL GLASS SM (MISCELLANEOUS) ×2 IMPLANT
DRAPE C-ARM 42X120 X-RAY (DRAPES) ×2 IMPLANT
DRAPE LAPAROTOMY TRNSV 102X78 (DRAPE) ×2 IMPLANT
DRAPE UTILITY XL STRL (DRAPES) ×2 IMPLANT
ELECT PENCIL ROCKER SW 15FT (MISCELLANEOUS) ×2 IMPLANT
ELECT REM PT RETURN 9FT ADLT (ELECTROSURGICAL) ×2
ELECTRODE REM PT RTRN 9FT ADLT (ELECTROSURGICAL) ×1 IMPLANT
GAUZE SPONGE 4X4 16PLY XRAY LF (GAUZE/BANDAGES/DRESSINGS) ×2 IMPLANT
GLOVE BIO SURGEON STRL SZ 6 (GLOVE) ×2 IMPLANT
GLOVE INDICATOR 6.5 STRL GRN (GLOVE) ×2 IMPLANT
GOWN STRL REUS W/TWL 2XL LVL3 (GOWN DISPOSABLE) ×2 IMPLANT
GOWN STRL REUS W/TWL XL LVL3 (GOWN DISPOSABLE) ×2 IMPLANT
KIT BASIN OR (CUSTOM PROCEDURE TRAY) ×2 IMPLANT
KIT PORT POWER 8FR ISP CVUE (Catheter) ×1 IMPLANT
LIQUID BAND (GAUZE/BANDAGES/DRESSINGS) ×2 IMPLANT
NEEDLE HYPO 22GX1.5 SAFETY (NEEDLE) ×2 IMPLANT
PACK BASIC VI WITH GOWN DISP (CUSTOM PROCEDURE TRAY) ×2 IMPLANT
SUT MNCRL AB 4-0 PS2 18 (SUTURE) ×2 IMPLANT
SUT PROLENE 2 0 SH DA (SUTURE) ×4 IMPLANT
SUT VIC AB 3-0 SH 27 (SUTURE) ×2
SUT VIC AB 3-0 SH 27X BRD (SUTURE) ×1 IMPLANT
SYR CONTROL 10ML LL (SYRINGE) ×2 IMPLANT
SYRINGE 10CC LL (SYRINGE) ×2 IMPLANT
TOWEL OR 17X26 10 PK STRL BLUE (TOWEL DISPOSABLE) ×2 IMPLANT
TOWEL OR NON WOVEN STRL DISP B (DISPOSABLE) ×2 IMPLANT
YANKAUER SUCT BULB TIP 10FT TU (MISCELLANEOUS) ×1 IMPLANT

## 2016-03-22 NOTE — Anesthesia Postprocedure Evaluation (Signed)
Anesthesia Post Note  Patient: Colton Bush  Procedure(s) Performed: Procedure(s) (LRB): INSERTION PORT-A-CATH (Left)  Patient location during evaluation: PACU Anesthesia Type: General Level of consciousness: awake and alert Pain management: pain level controlled Vital Signs Assessment: post-procedure vital signs reviewed and stable Respiratory status: spontaneous breathing, nonlabored ventilation, respiratory function stable and patient connected to nasal cannula oxygen Cardiovascular status: blood pressure returned to baseline and stable Postop Assessment: no signs of nausea or vomiting Anesthetic complications: no    Last Vitals:  Vitals:   03/22/16 1215 03/22/16 1230  BP: 110/68 112/67  Pulse: 70 74  Resp: 14 14  Temp:      Last Pain:  Vitals:   03/22/16 1230  TempSrc:   PainSc: 0-No pain                 [name]

## 2016-03-22 NOTE — Progress Notes (Signed)
[place] forms left in box. 03/17/16. I left for dr. [name] to sign-faxed  [id number] to medical records-copy left at front desk for pk up

## 2016-03-22 NOTE — H&P (Signed)
Colton Bush is an 60 y.o. male.   Chief Complaint: pancreatic cancer HPI:  Pt is a 60 yo M who presents with adenocarcinoma of the pancreatic head.  He had jaundice and was stented.  Bx was positive for cancer.  Possible portal vein invasion.  Plans are for up front chemotherapy.    Past Medical History:  Diagnosis Date  . Arthritis    left hand  . Bronchitis 1977  . Cancer [facility])    pancreatic cancer  . Depression    takes Cymbalta daily  . Diabetes mellitus type II    sees [name]   . GERD (gastroesophageal reflux disease)    takes Omeprazole daily  . H/O hiatal hernia   . Hyperlipidemia    takes Zocor daily  . Hypertension    takes Amlodipine daily  . Neck pain    C4-7 stenosis and herniated disc  . Neuromuscular disorder ([place])    hiatal hernia  . Scoliosis    slight  . Spinal cord injury, C5-C7 ([place])    c4-c7  . Stiffness of hand joint    d/t cervical issues    Past Surgical History:  Procedure Laterality Date  . ANTERIOR CERVICAL DECOMP/DISCECTOMY FUSION  08/18/2011   Procedure: ANTERIOR CERVICAL DECOMPRESSION/DISCECTOMY FUSION 3 LEVELS;  Surgeon: Winfield Cunas, MD;  Location: [place] [facility]URO ORS;  Service: Neurosurgery;  Laterality: N/A;  Anterior Cervical Four-Five/Five-Six/Six-Seven Decompression with Fusion, Plating, and Bonegraft  . CARPAL TUNNEL RELEASE  2013   bilateral, per Dr. Christella Noa   . COLONOSCOPY  10-30-14   per Dr. Olevia Perches, clear, repeat in 10 yrs   . egd with esophageal dilation  9-08   per Dr. Olevia Perches  . ERCP N/A 03/01/2016   Procedure: ENDOSCOPIC RETROGRADE CHOLANGIOPANCREATOGRAPHY (ERCP) with brushings and stent;  Surgeon: Doran Stabler, MD;  Location: [facility] ENDOSCOPY;  Service: Endoscopy;  Laterality: N/A;  . EUS N/A 03/09/2016   Procedure: ESOPHAGEAL ENDOSCOPIC ULTRASOUND (EUS) RADIAL;  Surgeon: Milus Banister, MD;  Location: [facility] ENDOSCOPY;  Service: Endoscopy;  Laterality: N/A;  . lymph nodes biopsy    . melanoma rt calf  1999  . SPINE SURGERY    .  TONSILLECTOMY     as a child  . ULNAR TUNNEL RELEASE  2013   right arm, per Dr. Christella Noa   . UPPER GASTROINTESTINAL ENDOSCOPY      Family History  Problem Relation Age of Onset  . Heart disease Father   . Heart disease Brother 42  . Anesthesia problems Mother   . Heart disease Mother   . Dementia Mother   . Diabetes Sister   . Stroke Sister   . Colon cancer Neg Hx   . Rectal cancer Neg Hx   . Stomach cancer Neg Hx    Social History:  reports that he has never smoked. He has never used smokeless tobacco. He reports that he does not drink alcohol or use drugs.  Allergies: No Known Allergies  Medications Prior to Admission  Medication Sig Dispense Refill  . amLODipine (NORVASC) 10 MG tablet Take 1 tablet (10 mg total) by mouth daily. 30 tablet 1  . benazepril (LOTENSIN) 10 MG tablet Take 1 tablet (10 mg total) by mouth daily. 30 tablet 1  . insulin glargine (LANTUS) 100 UNIT/ML injection Inject 0.2 mLs (20 Units total) into the skin daily. 10 mL 11  . Insulin Pen Needle 30G X 5 MM MISC Use one daily with insulin 100 each 2  .  Lancets (FREESTYLE) lancets USE AS INSTRUCTED TO CHECK BLOOD SUGAR ONCE A DAY 100 each 1  . Multiple Vitamin (MULTIVITAMIN WITH MINERALS) TABS tablet Take 1 tablet by mouth daily.    Marland Kitchen omega-3 acid ethyl esters (LOVAZA) 1 g capsule Take 2 g by mouth daily.    Marland Kitchen omeprazole (PRILOSEC) 20 MG capsule Take 20 mg by mouth daily.    . ondansetron (ZOFRAN) 8 MG tablet Take 1 tablet (8 mg total) by mouth 2 (two) times daily as needed for refractory nausea / vomiting. Start on day 3 after chemotherapy. 30 tablet 1  . prochlorperazine (COMPAZINE) 10 MG tablet Take 1 tablet (10 mg total) by mouth every 6 (six) hours as needed (NAUSEA). 30 tablet 1  . docusate sodium (COLACE) 100 MG capsule Take 1 capsule (100 mg total) by mouth 2 (two) times daily. (Patient not taking: Reported on 03/17/2016) 10 capsule 0  . Insulin Detemir (LEVEMIR) 100 UNIT/ML Pen Inject 20 units into the  skin each evening. (Patient not taking: Reported on 03/21/2016) 30 mL 3  . lidocaine-prilocaine (EMLA) cream Apply to affected area once (Patient taking differently: Apply 1 application topically as needed (for port). ) 30 g 3  . oxyCODONE (OXY IR/ROXICODONE) 5 MG immediate release tablet Take 1 tablet (5 mg total) by mouth every 4 (four) hours as needed for moderate pain. (Patient not taking: Reported on 03/10/2016) 10 tablet 0  . polyethylene glycol (MIRALAX / GLYCOLAX) packet Take 17 g by mouth daily as needed for mild constipation. (Patient not taking: Reported on 03/17/2016) 14 each 0    Results for orders placed or performed during the hospital encounter of 03/22/16 (from the past 48 hour(s))  Glucose, capillary     Status: Abnormal   Collection Time: 03/22/16  7:55 AM  Result Value Ref Range   Glucose-Capillary 343 (H) 65 - 99 mg/dL  Glucose, capillary     Status: Abnormal   Collection Time: 03/22/16  9:53 AM  Result Value Ref Range   Glucose-Capillary 269 (H) 65 - 99 mg/dL   No results found.  Review of Systems  All other systems reviewed and are negative.   Blood pressure 119/81, pulse 92, temperature 98.4 F (36.9 C), temperature source Oral, resp. rate 18, height 5\' 10"  (1.778 m), weight 83.3 kg (183 lb 11 oz), SpO2 99 %. Physical Exam  Constitutional: He is oriented to person, place, and time. He appears well-developed. No distress.  HENT:  Head: Normocephalic and atraumatic.  Eyes: No scleral icterus.  Respiratory: Effort normal. No respiratory distress.  GI: Soft.  Neurological: He is alert and oriented to person, place, and time.  Skin: He is not diaphoretic.  Psychiatric: He has a normal mood and affect. His behavior is normal. Judgment and thought content normal.     Assessment/Plan Pancreatic cancer.  Plan port placement. Discussed risks and benefits.    Stark Klein, MD 03/22/2016, 11:07 AM

## 2016-03-22 NOTE — Transfer of Care (Signed)
Immediate Anesthesia Transfer of Care Note  Patient: GEORGI ORD  Procedure(s) Performed: Procedure(s): INSERTION PORT-A-CATH (Left)  Patient Location: PACU  Anesthesia Type:General  Level of Consciousness:  sedated, patient cooperative and responds to stimulation  Airway & Oxygen Therapy:Patient Spontanous Breathing and Patient connected to face mask oxgen  Post-op Assessment:  Report given to PACU RN and Post -op Vital signs reviewed and stable  Post vital signs:  Reviewed and stable  Last Vitals:  Vitals:   03/22/16 0755 03/22/16 1204  BP: 119/81 117/71  Pulse: 92 71  Resp: 18 14  Temp: 36.9 C 0000000 C    Complications: No apparent anesthesia complications]

## 2016-03-22 NOTE — Anesthesia Preprocedure Evaluation (Signed)
Anesthesia Evaluation  Patient identified by MRN, date of birth, ID band Patient awake    Reviewed: Allergy & Precautions, NPO status , Patient's Chart, lab work & pertinent test results, reviewed documented beta blocker date and time   Airway Mallampati: II  TM Distance: >3 FB Neck ROM: Full    Dental no notable dental hx. (+) Dental Advisory Given   Pulmonary neg pulmonary ROS,    Pulmonary exam normal breath sounds clear to auscultation       Cardiovascular hypertension, Normal cardiovascular exam Rhythm:Regular Rate:Normal     Neuro/Psych PSYCHIATRIC DISORDERS Anxiety Depression negative neurological ROS     GI/Hepatic Neg liver ROS, GERD  Medicated and Controlled,  Endo/Other  diabetes  Renal/GU negative Renal ROS  negative genitourinary   Musculoskeletal negative musculoskeletal ROS (+)   Abdominal   Peds negative pediatric ROS (+)  Hematology negative hematology ROS (+)   Anesthesia Other Findings   Reproductive/Obstetrics negative OB ROS                             Anesthesia Physical Anesthesia Plan  ASA: III  Anesthesia Plan: General   Post-op Pain Management:    Induction: Intravenous  Airway Management Planned: LMA  Additional Equipment:   Intra-op Plan:   Post-operative Plan: Extubation in OR  Informed Consent: I have reviewed the patients History and Physical, chart, labs and discussed the procedure including the risks, benefits and alternatives for the proposed anesthesia with the patient or authorized representative who has indicated his/her understanding and acceptance.   Dental advisory given  Plan Discussed with: CRNA  Anesthesia Plan Comments:         Anesthesia Quick Evaluation

## 2016-03-22 NOTE — Anesthesia Procedure Notes (Signed)
Procedure Name: LMA Insertion Date/Time: 03/22/2016 11:20 AM Performed by: Anne Fu Pre-anesthesia Checklist: Patient identified, Emergency Drugs available, Suction available, Patient being monitored and Timeout performed Patient Re-evaluated:Patient Re-evaluated prior to inductionOxygen Delivery Method: Circle system utilized Preoxygenation: Pre-oxygenation with 100% oxygen Intubation Type: IV induction Ventilation: Mask ventilation without difficulty LMA: LMA inserted LMA Size: 4.0 Number of attempts: 1 Placement Confirmation: positive ETCO2 and breath sounds checked- equal and bilateral Tube secured with: Tape

## 2016-03-22 NOTE — Discharge Instructions (Addendum)
[place] Office Phone Number [phone]   POST OP INSTRUCTIONS  Always review your discharge instruction sheet given to you by the facility where your surgery was performed.  IF YOU HAVE DISABILITY OR FAMILY LEAVE FORMS, YOU MUST BRING THEM TO THE OFFICE FOR PROCESSING.  DO NOT GIVE THEM TO YOUR DOCTOR.  1. A prescription for pain medication may be given to you upon discharge.  Take your pain medication as prescribed, if needed.  If narcotic pain medicine is not needed, then you may take acetaminophen (Tylenol) or ibuprofen (Advil) as needed. 2. Take your usually prescribed medications unless otherwise directed 3. If you need a refill on your pain medication, please contact your pharmacy.  They will contact our office to request authorization.  Prescriptions will not be filled after 5pm or on week-ends. 4. You should eat very light the first 24 hours after surgery, such as soup, crackers, pudding, etc.  Resume your normal diet the day after surgery 5. It is common to experience some constipation if taking pain medication after surgery.  Increasing fluid intake and taking a stool softener will usually help or prevent this problem from occurring.  A mild laxative (Milk of Magnesia or Miralax) should be taken according to package directions if there are no bowel movements after 48 hours. 6. You may shower in 48 hours.  The surgical glue will flake off in 2-3 weeks.   7. ACTIVITIES:  No strenuous activity or heavy lifting for 1 week.   a. You may drive when you no longer are taking prescription pain medication, you can comfortably wear a seatbelt, and you can safely maneuver your car and apply brakes. b. RETURN TO WORK:  __________to be determined. _____________ Dennis Bast should see your doctor in the office for a follow-up appointment approximately three-four weeks after your surgery.    WHEN TO CALL YOUR DOCTOR: 1. Fever over 101.0 2. Nausea and/or vomiting. 3. Extreme swelling or  bruising. 4. Continued bleeding from incision. 5. Increased pain, redness, or drainage from the incision.  The clinic staff is available to answer your questions during regular business hours.  Please dont hesitate to call and ask to speak to one of the nurses for clinical concerns.  If you have a medical emergency, go to the nearest emergency room or call 911.  A surgeon from [facility] is always on call at the hospital.  For further questions, please visit [email or web address]    General Anesthesia, Adult, Care After Refer to this sheet in the next few weeks. These instructions provide you with information on caring for yourself after your procedure. Your health care provider may also give you more specific instructions. Your treatment has been planned according to current medical practices, but problems sometimes occur. Call your health care provider if you have any problems or questions after your procedure. WHAT TO EXPECT AFTER THE PROCEDURE After the procedure, it is typical to experience:  Sleepiness.  Nausea and vomiting. HOME CARE INSTRUCTIONS  For the first 24 hours after general anesthesia:  Have a responsible person with you.  Do not drive a car. If you are alone, do not take public transportation.  Do not drink alcohol.  Do not take medicine that has not been prescribed by your health care provider.  Do not sign important papers or make important decisions.  You may resume a normal diet and activities as directed by your health care provider.  Change bandages (dressings) as directed.  If you have questions  or problems that seem related to general anesthesia, call the hospital and ask for the anesthetist or anesthesiologist on call. SEEK MEDICAL CARE IF:  You have nausea and vomiting that continue the day after anesthesia.  You develop a rash. SEEK IMMEDIATE MEDICAL CARE IF:   You have difficulty breathing.  You have chest pain.  You  have any allergic problems.   This information is not intended to replace advice given to you by your health care provider. Make sure you discuss any questions you have with your health care provider.   Document Released: 10/23/2000 Document Revised: 08/07/2014 Document Reviewed: 11/15/2011 Elsevier Interactive Patient Education [employer].

## 2016-03-22 NOTE — Op Note (Signed)
PREOPERATIVE DIAGNOSIS:  Pancreatic cancer     POSTOPERATIVE DIAGNOSIS:  Same     PROCEDURE: Left subclavian port placement, Bard ClearVue  Power Port, MRI safe, 8-French.      SURGEON:  Stark Klein, MD      ANESTHESIA:  General   FINDINGS:  Good venous return, easy flush, and tip of the catheter and   SVC 24 cm.      SPECIMEN:  None.      ESTIMATED BLOOD LOSS:  Minimal.      COMPLICATIONS:  None known.      PROCEDURE:  Pt was identified in the holding area and taken to   the operating room, where patient was placed supine on the operating room   table.  General  anesthesia was induced.  Patient's arms were tucked and the upper   chest and neck were prepped and draped in sterile fashion.  Time-out was   performed according to the surgical safety check list.  When all was   correct, we continued.   Local anesthetic was administered over this   area at the angle of the clavicle.  The vein was accessed with 2 passes of the needle. There was good venous return and the wire passed easily with no ectopy.   Fluoroscopy was used to confirm that the wire was in the vena cava.      The patient was placed back level and the area for the pocket was anethetized   with local anesthetic.  A 3-cm transverse incision was made with a #15   blade.  Cautery was used to divide the subcutaneous tissues down to the   pectoralis muscle.  An [employer] retractor was used to elevate the skin   while a pocket was created on top of the pectoralis fascia.  The port   was placed into the pocket to confirm that it was of adequate size.  The   catheter was preattached to the port.  The port was then secured to the   pectoralis fascia with four 2-0 Prolene sutures.  These were clamped and   not tied down yet.    The catheter was tunneled through to the wire exit   site.  The catheter was placed along the wire to determine what length it should be to be in the SVC.  The catheter was cut at 24 cm.  The tunneler  sheath and dilator were passed over the wire and the dilator and wire were removed.  The catheter was advanced through the tunneler sheath and the tunneler sheath was pulled away.  Care was taken to keep the catheter in the tunneler sheath as this occurred. This was advanced and the tunneler sheath was removed.  There was good venous   return and easy flush of the catheter.  The Prolene sutures were tied   down to the pectoral fascia.  The skin was reapproximated using 3-0   Vicryl interrupted deep dermal sutures.    Fluoroscopy was used to re-confirm good position of the catheter.  The skin   was then closed using 4-0 Monocryl in a subcuticular fashion.  The port was flushed with concentrated heparin flush as well.  The wounds were then cleaned, dried, and dressed with Dermabond.  The patient was awakened from anesthesia and taken to the PACU in stable condition.  Needle, sponge, and instrument counts were correct.               Stark Klein, MD

## 2016-03-22 NOTE — Progress Notes (Signed)
Call to Dr Lauretta Grill for [facility] of [phone]

## 2016-03-23 ENCOUNTER — Ambulatory Visit: Payer: Self-pay | Admitting: Endocrinology

## 2016-03-24 ENCOUNTER — Other Ambulatory Visit: Payer: Self-pay

## 2016-03-24 ENCOUNTER — Other Ambulatory Visit: Payer: 59

## 2016-03-24 ENCOUNTER — Telehealth: Payer: Self-pay | Admitting: Family Medicine

## 2016-03-24 ENCOUNTER — Other Ambulatory Visit (HOSPITAL_BASED_OUTPATIENT_CLINIC_OR_DEPARTMENT_OTHER): Payer: 59

## 2016-03-24 DIAGNOSIS — C250 Malignant neoplasm of head of pancreas: Secondary | ICD-10-CM

## 2016-03-24 LAB — COMPREHENSIVE METABOLIC PANEL
ALT: 72 U/L — ABNORMAL HIGH (ref 0–55)
AST: 33 U/L (ref 5–34)
Albumin: 3.5 g/dL (ref 3.5–5.0)
Alkaline Phosphatase: 319 U/L — ABNORMAL HIGH (ref 40–150)
Anion Gap: 12 mEq/L — ABNORMAL HIGH (ref 3–11)
BUN: 14.2 mg/dL (ref 7.0–26.0)
CO2: 24 mEq/L (ref 22–29)
Calcium: 10.1 mg/dL (ref 8.4–10.4)
Chloride: 102 mEq/L (ref 98–109)
Creatinine: 0.8 mg/dL (ref 0.7–1.3)
EGFR: >90 mL/min/{1.73_m2} (ref >90)
Glucose: 322 mg/dl — ABNORMAL HIGH (ref 70–140)
Potassium: 4.8 mEq/L (ref 3.5–5.1)
Sodium: 138 mEq/L (ref 136–145)
Total Bilirubin: 1.59 mg/dL — ABNORMAL HIGH (ref 0.20–1.20)
Total Protein: 7.8 g/dL (ref 6.4–8.3)

## 2016-03-24 LAB — CBC WITH DIFFERENTIAL/PLATELET
BASO%: 1.1 % (ref 0.0–2.0)
Basophils Absolute: 0.1 10*3/uL (ref 0.0–0.1)
EOS%: 1 % (ref 0.0–7.0)
Eosinophils Absolute: 0.1 10*3/uL (ref 0.0–0.5)
HCT: 38.6 % (ref 38.4–49.9)
HGB: 13 g/dL (ref 13.0–17.1)
LYMPH%: 9.5 % — ABNORMAL LOW (ref 14.0–49.0)
MCH: 34.4 pg — ABNORMAL HIGH (ref 27.2–33.4)
MCHC: 33.7 g/dL (ref 32.0–36.0)
MCV: 101.9 fL — ABNORMAL HIGH (ref 79.3–98.0)
MONO#: 0.8 10*3/uL (ref 0.1–0.9)
MONO%: 7.5 % (ref 0.0–14.0)
NEUT#: 8.4 10*3/uL — ABNORMAL HIGH (ref 1.5–6.5)
NEUT%: 80.9 % — ABNORMAL HIGH (ref 39.0–75.0)
Platelets: 284 10*3/uL (ref 140–400)
RBC: 3.79 10*6/uL — ABNORMAL LOW (ref 4.20–5.82)
RDW: 13.1 % (ref 11.0–14.6)
WBC: 10.4 10*3/uL — ABNORMAL HIGH (ref 4.0–10.3)
lymph#: 1 10*3/uL (ref 0.9–3.3)

## 2016-03-24 MED ORDER — INSULIN DETEMIR 100 UNIT/ML FLEXPEN: PEN_INJECTOR | SUBCUTANEOUS | 3 refills | Status: DC

## 2016-03-24 NOTE — Telephone Encounter (Signed)
Started the PA, but [employer] said that no PA is needed. Called and spoke with pharmacy and they didn't have a Levemir Rx at all. Resent Rx to pharmacy.

## 2016-03-24 NOTE — Telephone Encounter (Signed)
Pt was put on lantus while in the hospital. But it was too expensive, pt was given samples  of the lantus and a new rx for Detemir (LEVEMIR) 100 UNIT/ML Pen was [facility] to  [facility] But rx needs a prior authorization.  Pt still has a few samples, but will need prior asap

## 2016-03-25 LAB — CANCER ANTIGEN 19-9: CA 19-9: 648 U/mL — ABNORMAL HIGH (ref 0–35)

## 2016-03-27 ENCOUNTER — Other Ambulatory Visit: Payer: Self-pay

## 2016-03-27 ENCOUNTER — Ambulatory Visit: Admit: 2016-03-27 | Payer: 59 | Admitting: General Surgery

## 2016-03-27 SURGERY — INSERTION, TUNNELED CENTRAL VENOUS DEVICE, WITH PORT
Anesthesia: General

## 2016-03-28 ENCOUNTER — Ambulatory Visit (HOSPITAL_BASED_OUTPATIENT_CLINIC_OR_DEPARTMENT_OTHER): Payer: 59

## 2016-03-28 ENCOUNTER — Encounter: Payer: Self-pay | Admitting: *Deleted

## 2016-03-28 ENCOUNTER — Other Ambulatory Visit: Payer: Self-pay | Admitting: Nurse Practitioner

## 2016-03-28 ENCOUNTER — Ambulatory Visit: Payer: 59 | Admitting: Nurse Practitioner

## 2016-03-28 ENCOUNTER — Telehealth: Payer: Self-pay | Admitting: Hematology

## 2016-03-28 ENCOUNTER — Ambulatory Visit: Payer: 59 | Admitting: Nutrition

## 2016-03-28 ENCOUNTER — Telehealth: Payer: Self-pay | Admitting: Family Medicine

## 2016-03-28 ENCOUNTER — Other Ambulatory Visit (HOSPITAL_BASED_OUTPATIENT_CLINIC_OR_DEPARTMENT_OTHER): Payer: 59

## 2016-03-28 ENCOUNTER — Ambulatory Visit (HOSPITAL_BASED_OUTPATIENT_CLINIC_OR_DEPARTMENT_OTHER): Payer: 59 | Admitting: Hematology

## 2016-03-28 ENCOUNTER — Encounter: Payer: Self-pay | Admitting: Hematology

## 2016-03-28 VITALS — BP 136/80 | HR 109 | Temp 98.4°F | Resp 18 | Ht 70.0 in | Wt 183.3 lb

## 2016-03-28 VITALS — BP 149/82 | HR 95 | Temp 98.1°F | Resp 18

## 2016-03-28 DIAGNOSIS — E119 Type 2 diabetes mellitus without complications: Secondary | ICD-10-CM | POA: Diagnosis not present

## 2016-03-28 DIAGNOSIS — C801 Malignant (primary) neoplasm, unspecified: Secondary | ICD-10-CM

## 2016-03-28 DIAGNOSIS — E118 Type 2 diabetes mellitus with unspecified complications: Secondary | ICD-10-CM

## 2016-03-28 DIAGNOSIS — I10 Essential (primary) hypertension: Secondary | ICD-10-CM | POA: Diagnosis not present

## 2016-03-28 DIAGNOSIS — C250 Malignant neoplasm of head of pancreas: Secondary | ICD-10-CM

## 2016-03-28 DIAGNOSIS — Z794 Long term (current) use of insulin: Principal | ICD-10-CM

## 2016-03-28 DIAGNOSIS — Z5111 Encounter for antineoplastic chemotherapy: Secondary | ICD-10-CM | POA: Diagnosis not present

## 2016-03-28 DIAGNOSIS — K838 Other specified diseases of biliary tract: Secondary | ICD-10-CM

## 2016-03-28 DIAGNOSIS — K831 Obstruction of bile duct: Secondary | ICD-10-CM

## 2016-03-28 LAB — COMPREHENSIVE METABOLIC PANEL
ALT: 43 U/L (ref 0–55)
AST: 19 U/L (ref 5–34)
Albumin: 2.9 g/dL — ABNORMAL LOW (ref 3.5–5.0)
Alkaline Phosphatase: 317 U/L — ABNORMAL HIGH (ref 40–150)
Anion Gap: 17 mEq/L — ABNORMAL HIGH (ref 3–11)
BUN: 16.2 mg/dL (ref 7.0–26.0)
CO2: 20 mEq/L — ABNORMAL LOW (ref 22–29)
Calcium: 10.4 mg/dL (ref 8.4–10.4)
Chloride: 100 mEq/L (ref 98–109)
Creatinine: 0.8 mg/dL (ref 0.7–1.3)
EGFR: >90 mL/min/{1.73_m2} (ref >90)
Glucose: 385 mg/dl — ABNORMAL HIGH (ref 70–140)
Potassium: 4.5 mEq/L (ref 3.5–5.1)
Sodium: 138 mEq/L (ref 136–145)
Total Bilirubin: 1.03 mg/dL (ref 0.20–1.20)
Total Protein: 7.9 g/dL (ref 6.4–8.3)

## 2016-03-28 LAB — CBC WITH DIFFERENTIAL/PLATELET
BASO%: 0.7 % (ref 0.0–2.0)
Basophils Absolute: 0.1 10*3/uL (ref 0.0–0.1)
EOS%: 0.1 % (ref 0.0–7.0)
Eosinophils Absolute: 0 10*3/uL (ref 0.0–0.5)
HCT: 37.6 % — ABNORMAL LOW (ref 38.4–49.9)
HGB: 12.6 g/dL — ABNORMAL LOW (ref 13.0–17.1)
LYMPH%: 7.2 % — ABNORMAL LOW (ref 14.0–49.0)
MCH: 33.6 pg — ABNORMAL HIGH (ref 27.2–33.4)
MCHC: 33.4 g/dL (ref 32.0–36.0)
MCV: 100.4 fL — ABNORMAL HIGH (ref 79.3–98.0)
MONO#: 1.1 10*3/uL — ABNORMAL HIGH (ref 0.1–0.9)
MONO%: 8.2 % (ref 0.0–14.0)
NEUT#: 11.3 10*3/uL — ABNORMAL HIGH (ref 1.5–6.5)
NEUT%: 83.8 % — ABNORMAL HIGH (ref 39.0–75.0)
Platelets: 373 10*3/uL (ref 140–400)
RBC: 3.74 10*6/uL — ABNORMAL LOW (ref 4.20–5.82)
RDW: 13.3 % (ref 11.0–14.6)
WBC: 13.5 10*3/uL — ABNORMAL HIGH (ref 4.0–10.3)
lymph#: 1 10*3/uL (ref 0.9–3.3)

## 2016-03-28 LAB — WHOLE BLOOD GLUCOSE
Glucose: 437 mg/dL — ABNORMAL HIGH (ref 70–100)
Glucose: 399 mg/dL — ABNORMAL HIGH (ref 70–100)
Glucose: 442 mg/dL — ABNORMAL HIGH (ref 70–100)
HRS PC: 0 Hours
HRS PC: 0.5 Hours

## 2016-03-28 MED ORDER — INSULIN ASPART 100 UNIT/ML ~~LOC~~ SOLN: SUBCUTANEOUS | 11 refills | Status: DC

## 2016-03-28 MED ORDER — LEUCOVORIN CALCIUM INJECTION 350 MG
400.0000 mg/m2 | Freq: Once | INTRAVENOUS | Status: AC
  Administered 2016-03-28: 812 mg via INTRAVENOUS
  Filled 2016-03-28: qty 25

## 2016-03-28 MED ORDER — IRINOTECAN HCL CHEMO INJECTION 100 MG/5ML
180.0000 mg/m2 | Freq: Once | INTRAVENOUS | Status: AC
  Administered 2016-03-28: 360 mg via INTRAVENOUS
  Filled 2016-03-28: qty 15

## 2016-03-28 MED ORDER — DEXTROSE 5 % IV SOLN
Freq: Once | INTRAVENOUS | Status: AC
  Administered 2016-03-28: 10:00:00 via INTRAVENOUS

## 2016-03-28 MED ORDER — INSULIN REGULAR HUMAN 100 UNIT/ML IJ SOLN
18.0000 [IU] | Freq: Once | INTRAMUSCULAR | Status: AC
  Administered 2016-03-28: 18 [IU] via SUBCUTANEOUS
  Filled 2016-03-28: qty 0.18

## 2016-03-28 MED ORDER — SODIUM CHLORIDE 0.9 % IV SOLN
2400.0000 mg/m2 | INTRAVENOUS | Status: DC
  Administered 2016-03-28: 4850 mg via INTRAVENOUS
  Filled 2016-03-28: qty 97

## 2016-03-28 MED ORDER — INSULIN DETEMIR 100 UNIT/ML FLEXPEN: 20.0000 [IU] | PEN_INJECTOR | Freq: Two times a day (BID) | SUBCUTANEOUS | 3 refills | Status: DC

## 2016-03-28 MED ORDER — ATROPINE SULFATE 1 MG/ML IJ SOLN
0.5000 mg | Freq: Once | INTRAMUSCULAR | Status: DC | PRN
Start: 2016-03-28 — End: 2016-03-28

## 2016-03-28 MED ORDER — DEXAMETHASONE SODIUM PHOSPHATE 100 MG/10ML IJ SOLN
10.0000 mg | Freq: Once | INTRAMUSCULAR | Status: AC
  Administered 2016-03-28: 10 mg via INTRAVENOUS
  Filled 2016-03-28: qty 1

## 2016-03-28 MED ORDER — PALONOSETRON HCL INJECTION 0.25 MG/5ML
INTRAVENOUS | Status: AC
  Filled 2016-03-28: qty 5

## 2016-03-28 MED ORDER — OXALIPLATIN CHEMO INJECTION 100 MG/20ML
85.0000 mg/m2 | Freq: Once | INTRAVENOUS | Status: AC
  Administered 2016-03-28: 175 mg via INTRAVENOUS
  Filled 2016-03-28: qty 35

## 2016-03-28 MED ORDER — PALONOSETRON HCL INJECTION 0.25 MG/5ML
0.2500 mg | Freq: Once | INTRAVENOUS | Status: AC
  Administered 2016-03-28: 0.25 mg via INTRAVENOUS

## 2016-03-28 MED ORDER — INSULIN GLARGINE 100 UNIT/ML ~~LOC~~ SOLN: 20.0000 [IU] | Freq: Two times a day (BID) | SUBCUTANEOUS | 11 refills | Status: DC

## 2016-03-28 NOTE — Telephone Encounter (Signed)
Script for Novolog was printed and faxed to [facility], pt is aware that a new script was sent in.

## 2016-03-28 NOTE — Progress Notes (Signed)
Brief nutrition follow-up completed with patient during infusion for cancer of the pancreas. Today is patient's first treatment. Patient's weight is stable at 183 pounds. He has been trying to eat more to gain weight. He is trying to get used to room temperature foods and liquids since he will need to follow this after treatment.  Nutrition diagnosis: Provided supportive encouragement for patient to continue small frequent meals and snacks with high-calorie, high-protein foods. Education provided on diet after oxaliplatin. Teach back method used.  Monitoring, evaluation, goals:  Patient will tolerate adequate calories and protein for weight stabilization.  Next visit: To be scheduled with treatments as needed.  **Disclaimer: This note was dictated with voice recognition software. Similar sounding words can inadvertently be transcribed and this note may contain transcription errors which may not have been corrected upon publication of note.**

## 2016-03-28 NOTE — Patient Instructions (Signed)
[facility] Discharge Instructions for Patients Receiving Chemotherapy  Today you received the following chemotherapy agents Oxaliplatin, Leucovorin, and Irinotecan. To help prevent nausea and vomiting after your treatment, we encourage you to take your nausea medication  Zofran and Compazine. See detailed  See medication instructions in this paperwork.paperwork.   If you develop nausea and vomiting that is not controlled by your nausea medication, call the clinic.   BELOW ARE SYMPTOMS THAT SHOULD BE REPORTED IMMEDIATELY:  *FEVER GREATER THAN 100.5 F  *CHILLS WITH OR WITHOUT FEVER  NAUSEA AND VOMITING THAT IS NOT CONTROLLED WITH YOUR NAUSEA MEDICATION  *UNUSUAL SHORTNESS OF BREATH  *UNUSUAL BRUISING OR BLEEDING  TENDERNESS IN MOUTH AND THROAT WITH OR WITHOUT PRESENCE OF ULCERS  *URINARY PROBLEMS  *BOWEL PROBLEMS  Oxaliplatin Injection What is this medicine? OXALIPLATIN (ox AL i PLA tin) is a chemotherapy drug. It targets fast dividing cells, like cancer cells, and causes these cells to die. This medicine is used to treat cancers of the colon and rectum, and many other cancers. This medicine may be used for other purposes; ask your health care provider or pharmacist if you have questions. What should I tell my health care provider before I take this medicine? They need to know if you have any of these conditions: -kidney disease -an unusual or allergic reaction to oxaliplatin, other chemotherapy, other medicines, foods, dyes, or preservatives -pregnant or trying to get pregnant -breast-feeding How should I use this medicine? This drug is given as an infusion into a vein. It is administered in a hospital or clinic by a specially trained health care professional. Talk to your pediatrician regarding the use of this medicine in children. Special care may be needed. Overdosage: If you think you have taken too much of this medicine contact a poison control center or  emergency room at once. NOTE: This medicine is only for you. Do not share this medicine with others. What if I miss a dose? It is important not to miss a dose. Call your doctor or health care professional if you are unable to keep an appointment. What may interact with this medicine? -medicines to increase blood counts like filgrastim, pegfilgrastim, sargramostim -probenecid -some antibiotics like amikacin, gentamicin, neomycin, polymyxin B, streptomycin, tobramycin -zalcitabine Talk to your doctor or health care professional before taking any of these medicines: -acetaminophen -aspirin -ibuprofen -ketoprofen -naproxen This list may not describe all possible interactions. Give your health care provider a list of all the medicines, herbs, non-prescription drugs, or dietary supplements you use. Also tell them if you smoke, drink alcohol, or use illegal drugs. Some items may interact with your medicine. What should I watch for while using this medicine? Your condition will be monitored carefully while you are receiving this medicine. You will need important blood work done while you are taking this medicine. This medicine can make you more sensitive to cold. Do not drink cold drinks or use ice. Cover exposed skin before coming in contact with cold temperatures or cold objects. When out in cold weather wear warm clothing and cover your mouth and nose to warm the air that goes into your lungs. Tell your doctor if you get sensitive to the cold. This drug may make you feel generally unwell. This is not uncommon, as chemotherapy can affect healthy cells as well as cancer cells. Report any side effects. Continue your course of treatment even though you feel ill unless your doctor tells you to stop. In some cases, you may be  given additional medicines to help with side effects. Follow all directions for their use. Call your doctor or health care professional for advice if you get a fever, chills or sore  throat, or other symptoms of a cold or flu. Do not treat yourself. This drug decreases your body's ability to fight infections. Try to avoid being around people who are sick. This medicine may increase your risk to bruise or bleed. Call your doctor or health care professional if you notice any unusual bleeding. Be careful brushing and flossing your teeth or using a toothpick because you may get an infection or bleed more easily. If you have any dental work done, tell your dentist you are receiving this medicine. Avoid taking products that contain aspirin, acetaminophen, ibuprofen, naproxen, or ketoprofen unless instructed by your doctor. These medicines may hide a fever. Do not become pregnant while taking this medicine. Women should inform their doctor if they wish to become pregnant or think they might be pregnant. There is a potential for serious side effects to an unborn child. Talk to your health care professional or pharmacist for more information. Do not breast-feed an infant while taking this medicine. Call your doctor or health care professional if you get diarrhea. Do not treat yourself. What side effects may I notice from receiving this medicine? Side effects that you should report to your doctor or health care professional as soon as possible: -allergic reactions like skin rash, itching or hives, swelling of the face, lips, or tongue -low blood counts - This drug may decrease the number of white blood cells, red blood cells and platelets. You may be at increased risk for infections and bleeding. -signs of infection - fever or chills, cough, sore throat, pain or difficulty passing urine -signs of decreased platelets or bleeding - bruising, pinpoint red spots on the skin, black, tarry stools, nosebleeds -signs of decreased red blood cells - unusually weak or tired, fainting spells, lightheadedness -breathing problems -chest pain, pressure -cough -diarrhea -jaw tightness -mouth  sores -nausea and vomiting -pain, swelling, redness or irritation at the injection site -pain, tingling, numbness in the hands or feet -problems with balance, talking, walking -redness, blistering, peeling or loosening of the skin, including inside the mouth -trouble passing urine or change in the amount of urine Side effects that usually do not require medical attention (report to your doctor or health care professional if they continue or are bothersome): -changes in vision -constipation -hair loss -loss of appetite -metallic taste in the mouth or changes in taste -stomach pain This list may not describe all possible side effects. Call your doctor for medical advice about side effects. You may report side effects to FDA at 1-800-FDA-1088. Where should I keep my medicine? This drug is given in a hospital or clinic and will not be stored at home. NOTE: This sheet is a summary. It may not cover all possible information. If you have questions about this medicine, talk to your doctor, pharmacist, or health care provider.    2016, Elsevier/Gold Standard. (2008-02-11 17:22:47)    UNUSUAL RASH Items with * indicate a potential emergency and should be followed up as soon as possible.  Feel free to call the clinic you have any questions or concerns. The clinic phone number is [phone].  Please show the [facility] at check-in to the Emergency Department and triage nurse.  Irinotecan injection What is this medicine? IRINOTECAN (ir in oh TEE kan ) is a chemotherapy drug. It is used  to treat colon and rectal cancer. This medicine may be used for other purposes; ask your health care provider or pharmacist if you have questions. What should I tell my health care provider before I take this medicine? They need to know if you have any of these conditions: -blood disorders -dehydration -diarrhea -infection (especially a virus infection such as chickenpox, cold sores, or  herpes) -liver disease -low blood counts, like low white cell, platelet, or red cell counts -recent or ongoing radiation therapy -an unusual or allergic reaction to irinotecan, sorbitol, other chemotherapy, other medicines, foods, dyes, or preservatives -pregnant or trying to get pregnant -breast-feeding How should I use this medicine? This drug is given as an infusion into a vein. It is administered in a hospital or clinic by a specially trained health care professional. Talk to your pediatrician regarding the use of this medicine in children. Special care may be needed. Overdosage: If you think you have taken too much of this medicine contact a poison control center or emergency room at once. NOTE: This medicine is only for you. Do not share this medicine with others. What if I miss a dose? It is important not to miss your dose. Call your doctor or health care professional if you are unable to keep an appointment. What may interact with this medicine? Do not take this medicine with any of the following medications: -atazanavir -certain medicines for fungal infections like itraconazole and ketoconazole -St. John's Wort This medicine may also interact with the following medications: -dexamethasone -diuretics -laxatives -medicines for seizures like carbamazepine, mephobarbital, phenobarbital, phenytoin, primidone -medicines to increase blood counts like filgrastim, pegfilgrastim, sargramostim -prochlorperazine -vaccines This list may not describe all possible interactions. Give your health care provider a list of all the medicines, herbs, non-prescription drugs, or dietary supplements you use. Also tell them if you smoke, drink alcohol, or use illegal drugs. Some items may interact with your medicine. What should I watch for while using this medicine? Your condition will be monitored carefully while you are receiving this medicine. You will need important blood work done while you are  taking this medicine. This drug may make you feel generally unwell. This is not uncommon, as chemotherapy can affect healthy cells as well as cancer cells. Report any side effects. Continue your course of treatment even though you feel ill unless your doctor tells you to stop. In some cases, you may be given additional medicines to help with side effects. Follow all directions for their use. You may get drowsy or dizzy. Do not drive, use machinery, or do anything that needs mental alertness until you know how this medicine affects you. Do not stand or sit up quickly, especially if you are an older patient. This reduces the risk of dizzy or fainting spells. Call your doctor or health care professional for advice if you get a fever, chills or sore throat, or other symptoms of a cold or flu. Do not treat yourself. This drug decreases your body's ability to fight infections. Try to avoid being around people who are sick. This medicine may increase your risk to bruise or bleed. Call your doctor or health care professional if you notice any unusual bleeding. Be careful brushing and flossing your teeth or using a toothpick because you may get an infection or bleed more easily. If you have any dental work done, tell your dentist you are receiving this medicine. Avoid taking products that contain aspirin, acetaminophen, ibuprofen, naproxen, or ketoprofen unless instructed by your doctor.  These medicines may hide a fever. Do not become pregnant while taking this medicine. Women should inform their doctor if they wish to become pregnant or think they might be pregnant. There is a potential for serious side effects to an unborn child. Talk to your health care professional or pharmacist for more information. Do not breast-feed an infant while taking this medicine. What side effects may I notice from receiving this medicine? Side effects that you should report to your doctor or health care professional as soon as  possible: -allergic reactions like skin rash, itching or hives, swelling of the face, lips, or tongue -low blood counts - this medicine may decrease the number of white blood cells, red blood cells and platelets. You may be at increased risk for infections and bleeding. -signs of infection - fever or chills, cough, sore throat, pain or difficulty passing urine -signs of decreased platelets or bleeding - bruising, pinpoint red spots on the skin, black, tarry stools, blood in the urine -signs of decreased red blood cells - unusually weak or tired, fainting spells, lightheadedness -breathing problems -chest pain -diarrhea -feeling faint or lightheaded, falls -flushing, runny nose, sweating during infusion -mouth sores or pain -pain, swelling, redness or irritation where injected -pain, swelling, warmth in the leg -pain, tingling, numbness in the hands or feet -problems with balance, talking, walking -stomach cramps, pain -trouble passing urine or change in the amount of urine -vomiting as to be unable to hold down drinks or food -yellowing of the eyes or skin Side effects that usually do not require medical attention (report to your doctor or health care professional if they continue or are bothersome): -constipation -hair loss -headache -loss of appetite -nausea, vomiting -stomach upset This list may not describe all possible side effects. Call your doctor for medical advice about side effects. You may report side effects to FDA at 1-800-FDA-1088. Where should I keep my medicine? This drug is given in a hospital or clinic and will not be stored at home. NOTE: This sheet is a summary. It may not cover all possible information. If you have questions about this medicine, talk to your doctor, pharmacist, or health care provider.    2016, Elsevier/Gold Standard. (2013-01-13 16:29:32)   Leucovorin injection What is this medicine? LEUCOVORIN (loo koe VOR in) is used to prevent or treat  the harmful effects of some medicines. This medicine is used to treat anemia caused by a low amount of folic acid in the body. It is also used with 5-fluorouracil (5-FU) to treat colon cancer. This medicine may be used for other purposes; ask your health care provider or pharmacist if you have questions. What should I tell my health care provider before I take this medicine? They need to know if you have any of these conditions: -anemia from low levels of vitamin B-12 in the blood -an unusual or allergic reaction to leucovorin, folic acid, other medicines, foods, dyes, or preservatives -pregnant or trying to get pregnant -breast-feeding How should I use this medicine? This medicine is for injection into a muscle or into a vein. It is given by a health care professional in a hospital or clinic setting. Talk to your pediatrician regarding the use of this medicine in children. Special care may be needed. Overdosage: If you think you have taken too much of this medicine contact a poison control center or emergency room at once. NOTE: This medicine is only for you. Do not share this medicine with others. What if I miss  a dose? This does not apply. What may interact with this medicine? -capecitabine -fluorouracil -phenobarbital -phenytoin -primidone -trimethoprim-sulfamethoxazole This list may not describe all possible interactions. Give your health care provider a list of all the medicines, herbs, non-prescription drugs, or dietary supplements you use. Also tell them if you smoke, drink alcohol, or use illegal drugs. Some items may interact with your medicine. What should I watch for while using this medicine? Your condition will be monitored carefully while you are receiving this medicine. This medicine may increase the side effects of 5-fluorouracil, 5-FU. Tell your doctor or health care professional if you have diarrhea or mouth sores that do not get better or that get worse. What side  effects may I notice from receiving this medicine? Side effects that you should report to your doctor or health care professional as soon as possible: -allergic reactions like skin rash, itching or hives, swelling of the face, lips, or tongue -breathing problems -fever, infection -mouth sores -unusual bleeding or bruising -unusually weak or tired Side effects that usually do not require medical attention (report to your doctor or health care professional if they continue or are bothersome): -constipation or diarrhea -loss of appetite -nausea, vomiting This list may not describe all possible side effects. Call your doctor for medical advice about side effects. You may report side effects to FDA at 1-800-FDA-1088. Where should I keep my medicine? This drug is given in a hospital or clinic and will not be stored at home. NOTE: This sheet is a summary. It may not cover all possible information. If you have questions about this medicine, talk to your doctor, pharmacist, or health care provider.    2016, Elsevier/Gold Standard. (2008-01-21 16:50:29)

## 2016-03-28 NOTE — Telephone Encounter (Signed)
Faxed records to [facility] [phone]

## 2016-03-28 NOTE — Telephone Encounter (Signed)
I spoke to the Oncology clinic and Colton Bush's random glucose was over 400. We will increase his Lantus to 20 units bid, and we will add Novolog insulin to give bid per a sliding scale. We will also refer hi to a new Endocrinologist

## 2016-03-28 NOTE — Progress Notes (Signed)
Oncology Nurse Navigator Documentation  Oncology Nurse Navigator Flowsheets 03/28/2016  Navigator Location [facility]  Navigator Encounter Type Treatment  Telephone -  Abnormal Finding Date -  Confirmed Diagnosis Date -  Treatment Initiated Date 03/28/2016  Patient Visit Type MedOnc  Treatment Phase First Chemo Tx--FOLRIRINOX #1  Barriers/Navigation Needs Family concerns--FMLA/wants light duty, but unsure if work will allow this. He has no short term disability benefit  Education Concerns with Finances/ Eligibility--  Interventions Other--informed him our office will help w/any forms he needs done  Education Method -  Support Groups/Services GI Support Group;Other---[facility] Support Calendar  Acuity Level 1  Time Spent with Patient 15  Colton Bush here for his 1st chemo today. Wife dropped him off and will pick him up today. Says he is tired of sitting around the house, but does admit he has several projects he has wanted to do that if he can get motivated, he may start. Encouraged him to try to start one-one he gets started it will be enjoyable for him. Expresses concern that his company will not allow him to work light duty and may lose his insurance. He is still in communication with the company about this issue.

## 2016-03-28 NOTE — Progress Notes (Signed)
1335: Patient was observed sweating profusely. Oxaliplatin had 30 cc remaining. Chemo stopped. Vitals stable 142/71, 98.1, 18 resps, pulse 88 and O2 sat 95% on room air. Selena Lesser at bedside for assessment.  1343 : CBG obtained which was 437.  1402: 18 units of Regular insulin administered as ordered.  1432: Repeat CBG was 399. 1452: IV lines changed to normal saline. 1335: Orders to resume Chemo obtained.  1514: An additional 18  Units of Regular insulin administered as ordered. 1615: CBG 422. Cyndee bacon NP aware. Prior to D/C both Selena Lesser NP and Dr. [name] were at bedside to assess pt.  All chemo education completed. Patient able to verbalize plan for nausea and vomitting and/or diarrhea.  Patient istructed that he will get a follow-up call tomorrow from the [facility].

## 2016-03-28 NOTE — Progress Notes (Signed)
[place]  Telephone:([phone]) [phone] Fax:(336) [phone]  Clinic Follow up Note   Patient Care Team: [name], MD as PCP - General 03/28/2016  CHIEF COMPLAINTS:  Follow up pancreatic cancer  Oncology History   Adenocarcinoma of head of pancreas [facility])   Staging form: Pancreas, AJCC 7th Edition   - Clinical stage from 03/09/2016: Stage IIB (T2, N1, M0) - Signed by Truitt Merle, MD on 03/17/2016      Adenocarcinoma of head of pancreas ([place])   02/27/2016 Imaging    CT chest, abdomen and pelvis with contrast showed ill-defined heterogeneity of pancreatic head, highly suspicious for malignancy, ) quit about and biliary ductal dilatation, mildly prominent lymph nodes in the upper abdomen, largest 2 cm in the portocaval . No other metastasis.       03/09/2016 Initial Diagnosis    Adenocarcinoma of head of pancreas ([place])      03/09/2016 Procedure    Upper EUS showed a 3.5 cm irregular mass in the pancreatic head, causing pancreatic and biliary duct obstruction. The mass involves the portal vein 422 mm, strongly suggesting invasion. There is suspicious nearby adenopathy      03/09/2016 Initial Biopsy    Fine-needle aspiration of the pancreatic mass from EUS showed malignant cells consistent with adenocarcinoma.        HISTORY OF PRESENTING ILLNESS:  Colton Bush 60 y.o. male is here because of His newly diagnosed pancreatic cancer. He is accompanied by his wife to our multidisciplinary GI clinic today.  He noticed fatigue, anorexia and weight loss about 50bs in the past 4 months. It started when his endocrinologist changed his diabetic meds. He presented to hospital on 7/30 with jaundice. No pain or nausea. CT scan revealed a heterogeneous pancreatic head mass, highly suspicious for malignancy. Mild biliary and pancreatic duct dilatation, mildly prominent lymph nodes in the upper abdomen. He underwent ERCP and CBD stent placement, and subsequent upper EUS on 03/09/2016,  which showed a 3.5 cm irregular mass in the pancreatic head, the mass involves the portal vein, no suspicious nearby adenopathy.  He has been feeling better after stent placement, with improved appetite and energy level. He feels normal again. No symptoms. BM is good, he has gained about 8lbs back.   CURRENT THERAPY: neoadjuvant chemo with FOLFIRINOX every 2 weeks, starting on 03/28/2016  INTERIM HISTORY: MR. Slinker returns for follow up and first cycle chemotherapy. He is doing well overall, his blood sugar has been in the range of 200-300mg /dl latley, he started the Lantus about 2-3 weeks ago. His appetite and and your level overall has improved some lately, he is able to function well. No significant pain or nausea.   MEDICAL HISTORY:  Past Medical History:  Diagnosis Date  . Arthritis    left hand  . Bronchitis 1977  . [facility]    pancreatic cancer  . Depression    takes Cymbalta daily  . Diabetes mellitus type II    sees [name]   . GERD (gastroesophageal reflux disease)    takes Omeprazole daily  . H/O hiatal hernia   . Hyperlipidemia    takes Zocor daily  . Hypertension    takes Amlodipine daily  . Neck pain    C4-7 stenosis and herniated disc  . Neuromuscular disorder ([place])    hiatal hernia  . Scoliosis    slight  . Spinal cord injury, C5-C7 ([place])    c4-c7  . Stiffness of hand joint  d/t cervical issues    SURGICAL HISTORY: Past Surgical History:  Procedure Laterality Date  . ANTERIOR CERVICAL DECOMP/DISCECTOMY FUSION  08/18/2011   Procedure: ANTERIOR CERVICAL DECOMPRESSION/DISCECTOMY FUSION 3 LEVELS;  Surgeon: Winfield Cunas, MD;  Location: [place] [facility];  Service: Neurosurgery;  Laterality: N/A;  Anterior Cervical Four-Five/Five-Six/Six-Seven Decompression with Fusion, Plating, and Bonegraft  . CARPAL TUNNEL RELEASE  2013   bilateral, per Dr. Christella Noa   . COLONOSCOPY  10-30-14   per Dr. Olevia Perches, clear, repeat in 10 yrs   . egd with esophageal dilation  9-08     per Dr. Olevia Perches  . ERCP N/A 03/01/2016   Procedure: ENDOSCOPIC RETROGRADE CHOLANGIOPANCREATOGRAPHY (ERCP) with brushings and stent;  Surgeon: Doran Stabler, MD;  Location: [facility];  Service: Endoscopy;  Laterality: N/A;  . EUS N/A 03/09/2016   Procedure: ESOPHAGEAL ENDOSCOPIC ULTRASOUND (EUS) RADIAL;  Surgeon: Milus Banister, MD;  Location: [facility] ENDOSCOPY;  Service: Endoscopy;  Laterality: N/A;  . lymph nodes biopsy    . melanoma rt calf  1999  . PORTACATH PLACEMENT Left 03/22/2016   Procedure: INSERTION PORT-A-CATH;  Surgeon: Stark Klein, MD;  Location: [facility] ORS;  Service: General;  Laterality: Left;  . SPINE SURGERY    . TONSILLECTOMY     as a child  . ULNAR TUNNEL RELEASE  2013   right arm, per Dr. Christella Noa   . UPPER GASTROINTESTINAL ENDOSCOPY      SOCIAL HISTORY: Social History   Social History  . Marital status: Married    Spouse name: [name]  . Number of children: N/A  . Years of education: N/A   Occupational History  . Driver    Social History Main Topics  . Smoking status: Never Smoker  . Smokeless tobacco: Never Used     Comment: tried a pipe 35 years ago   . Alcohol use No  . Drug use: No  . Sexual activity: Yes   Other Topics Concern  . Not on file   Social History Narrative   Married, wife Rosaria Ferries Nutritional therapist-   He works as a Geophysicist/field seismologist for a company  He has one son 97 yo. Lives with his wife   FAMILY HISTORY: Family History  Problem Relation Age of Onset  . Heart disease Father   . Heart disease Brother 6  . Anesthesia problems Mother   . Heart disease Mother   . Dementia Mother   . Diabetes Sister   . Stroke Sister   . Colon cancer Neg Hx   . Rectal cancer Neg Hx   . Stomach cancer Neg Hx     ALLERGIES:  has No Known Allergies.  MEDICATIONS:  Current Outpatient Prescriptions  Medication Sig Dispense Refill  . amLODipine (NORVASC) 10 MG tablet Take 1 tablet (10 mg total) by mouth daily. 30 tablet 1  . benazepril (LOTENSIN) 10 MG  tablet Take 1 tablet (10 mg total) by mouth daily. 30 tablet 1  . docusate sodium (COLACE) 100 MG capsule Take 1 capsule (100 mg total) by mouth 2 (two) times daily. 10 capsule 0  . Insulin Pen Needle 30G X 5 MM MISC Use one daily with insulin 100 each 2  . Lancets (FREESTYLE) lancets USE AS INSTRUCTED TO CHECK BLOOD SUGAR ONCE A DAY 100 each 1  . lidocaine-prilocaine (EMLA) cream Apply to affected area once (Patient taking differently: Apply 1 application topically as needed (for port). ) 30 g 3  . Multiple Vitamin (MULTIVITAMIN WITH MINERALS) TABS  tablet Take 1 tablet by mouth daily.    Marland Kitchen omega-3 acid ethyl esters (LOVAZA) 1 g capsule Take 2 g by mouth daily.    Marland Kitchen omeprazole (PRILOSEC) 20 MG capsule Take 20 mg by mouth daily.    . ondansetron (ZOFRAN) 8 MG tablet Take 1 tablet (8 mg total) by mouth 2 (two) times daily as needed for refractory nausea / vomiting. Start on day 3 after chemotherapy. 30 tablet 1  . oxyCODONE (OXY IR/ROXICODONE) 5 MG immediate release tablet Take 1-2 tablets (5-10 mg total) by mouth every 4 (four) hours as needed for moderate pain. 30 tablet 0  . polyethylene glycol (MIRALAX / GLYCOLAX) packet Take 17 g by mouth daily as needed for mild constipation. 14 each 0  . prochlorperazine (COMPAZINE) 10 MG tablet Take 1 tablet (10 mg total) by mouth every 6 (six) hours as needed (NAUSEA). 30 tablet 1  . insulin aspart (NOVOLOG) 100 UNIT/ML injection Give every morning with breakfast and every evening with supper per the following sliding scale: give 3 units for glucose 150-175, give 6 units for glucose 176-200, give 9 units for glucose 201-250, give 12 units for glucose greater than 250 3 vial 11  . Insulin Detemir (LEVEMIR) 100 UNIT/ML Pen Inject 20 Units into the skin 2 (two) times daily. Inject 20 units into the skin each evening. 30 mL 3  . insulin glargine (LANTUS) 100 UNIT/ML injection Inject 0.2 mLs (20 Units total) into the skin 2 (two) times daily. 10 mL 11   No current  facility-administered medications for this visit.     REVIEW OF SYSTEMS:   Constitutional: Denies fevers, chills or abnormal night sweats Eyes: Denies blurriness of vision, double vision or watery eyes Ears, nose, mouth, throat, and face: Denies mucositis or sore throat Respiratory: Denies cough, dyspnea or wheezes Cardiovascular: Denies palpitation, chest discomfort or lower extremity swelling Gastrointestinal:  Denies nausea, heartburn or change in bowel habits Skin: Denies abnormal skin rashes Lymphatics: Denies new lymphadenopathy or easy bruising Neurological:Denies numbness, tingling or new weaknesses Behavioral/Psych: Mood is stable, no new changes  All other systems were reviewed with the patient and are negative.  PHYSICAL EXAMINATION: ECOG PERFORMANCE STATUS: 1  Vitals:   03/28/16 0935  BP: 136/80  Pulse: (!) 109  Resp: 18  Temp: 98.4 F (36.9 C)   Filed Weights   03/28/16 0935  Weight: 183 lb 4.8 oz (83.1 kg)    GENERAL:alert, no distress and comfortable SKIN: skin color, texture, turgor are normal, no rashes or significant lesions EYES: normal, conjunctiva are pink and non-injected, sclera clear OROPHARYNX:no exudate, no erythema and lips, buccal mucosa, and tongue normal  NECK: supple, thyroid normal size, non-tender, without nodularity LYMPH:  no palpable lymphadenopathy in the cervical, axillary or inguinal LUNGS: clear to auscultation and percussion with normal breathing effort HEART: regular rate & rhythm and no murmurs and no lower extremity edema ABDOMEN:abdomen soft, non-tender and normal bowel sounds Musculoskeletal:no cyanosis of digits and no clubbing  PSYCH: alert & oriented x 3 with fluent speech NEURO: no focal motor/sensory deficits  LABORATORY DATA:  I have reviewed the data as listed CBC Latest Ref Rng & Units 03/28/2016 03/24/2016 03/21/2016  WBC 4.0 - 10.3 10e3/uL 13.5(H) 10.4(H) 7.1  Hemoglobin 13.0 - 17.1 g/dL 12.6(L) 13.0 11.9(L)    Hematocrit 38.4 - 49.9 % 37.6(L) 38.6 35.4(L)  Platelets 140 - 400 10e3/uL 373 284 322   CMP Latest Ref Rng & Units 03/28/2016 03/24/2016 03/21/2016  Glucose 70 - 140  mg/dl 385(H) 322(H) 329(H)  BUN 7.0 - 26.0 mg/dL 16.2 14.2 17  Creatinine 0.7 - 1.3 mg/dL 0.8 0.8 0.50(L)  Sodium 136 - 145 mEq/L 138 138 136  Potassium 3.5 - 5.1 mEq/L 4.5 4.8 3.8  Chloride 101 - 111 mmol/L - - 102  CO2 22 - 29 mEq/L 20(L) 24 25  Calcium 8.4 - 10.4 mg/dL 10.4 10.1 9.5  Total Protein 6.4 - 8.3 g/dL 7.9 7.8 -  Total Bilirubin 0.20 - 1.20 mg/dL 1.03 1.59(H) -  Alkaline Phos 40 - 150 U/L 317(H) 319(H) -  AST 5 - 34 U/L 19 33 -  ALT 0 - 55 U/L 43 72(H) -   PATHOLOGY REPORT  Diagnosis 03/09/2016 FINE NEEDLE ASPIRATION, ENDOSCOPIC, PANCREAS (SPECIMEN 1 OF 1 COLLECTED 03/09/16): MALIGNANT CELLS CONSISTENT WITH ADENOCARCINOMA. BACKGROUND NECROTIC DEBRIS AND ACUTE INFLAMMATION.  Diagnosis 03/01/2016 BILE DUCT BRUSHING(SPECIMEN 1 OF 1 COLLECTED 03/01/16): RARE ATYPICAL CELLS, SEE COMMENT.  RADIOGRAPHIC STUDIES: I have personally reviewed the radiological images as listed and agreed with the findings in the report. Dg Chest Port 1 View  Result Date: 03/22/2016 CLINICAL DATA:  Pancreatic carcinoma and status post Port-A-Cath placement. EXAM: PORTABLE CHEST 1 VIEW COMPARISON:  08/16/2011 FINDINGS: Left-sided subclavian venous Port-A-Cath has been placed with the catheter tip in the SVC. No pneumothorax identified. Mild scarring is present at both lung bases. There is no evidence of pulmonary edema, consolidation, nodule or pleural fluid. The heart size and mediastinal contours are within normal limits. The visualized skeletal structures are unremarkable. IMPRESSION: Status post Port-A-Cath placement with catheter tip in the SVC. No acute findings after placement. Electronically Signed   By: Aletta Edouard M.D.   On: 03/22/2016 12:42   Dg Ercp With Sphincterotomy  Result Date: 03/01/2016 CLINICAL DATA:  Jaundice, biliary  ductal dilatation and pancreatic ductal dilatation on recent CT. EXAM: ERCP TECHNIQUE: Multiple spot images obtained with the fluoroscopic device and submitted for interpretation post-procedure. FLUOROSCOPY TIME:  3.58 minutes, 58 mGy COMPARISON:  CT 02/27/2016 FINDINGS: A series of 5 fluoroscopic spot images document endoscopic catheterization and opacification of the biliary tree. There is irregular narrowing in the distal CBD. There is dilatation of the proximal CBD. The intrahepatic biliary tree is incompletely opacified, appearing dilated centrally. Subsequent images document placement of a plastic endoscopic stent across the distal CBD lesion. IMPRESSION: 1. Distal CBD stenotic lesion with proximal dilatation, and subsequent endoscopic stent placement. These images were submitted for radiologic interpretation only. Please see the procedural report for the amount of contrast and the fluoroscopy time utilized. Electronically Signed   By: Lucrezia Europe M.D.   On: 03/01/2016 13:34   EUS 03/09/2016 Dr. Ardis Hughs  - 3.5cm irregularly shaped, poorly defined mass in the pancreatic head. There is suspicious, nearby adenopathy. The mass is causing pancreatic and bililary duct obstruction (previously stented). The mass involves the portal vein for 52mm (abuttment and loss of usual tissue interface), strongly suggesting invasion. Preliminary cytology results are positive for malignancy (adenocarcinoma), await final report.  ASSESSMENT & PLAN: 60 year old Caucasian male, with past medical history of diabetes and hypertension, presented with epigastric pain, weight loss, and jaundice.  1. Primary pancreatic adenocarcinoma, in pancreas head, cT2N1M0, stage IIB, borderline resectable  -I have reviewed his CT abdomen and pelvis with contrast, EUS and biopsy findings with patient and his wife in great details. -His case was reviewed in our GI tumor board a few days ago. CT scan and US showed possible portal vein invasion  from the pancreatic tumor, this  is borderline resectable disease. No evidence of distant metastasis.  -We reviewed the nature history of pancreatic cancer, and the overall survival rate with chemotherapy, surgery, and radiation. Patient and his wife was discouraged by the overall dismal long term survival rate (~20%)  -He was seen by surgeon Dr. Barry Dienes today, Whipple surgery was discussed and offered to patient.  -I reviewed the high risk of cancer recurrence after surgery, and the role of neoadjuvant and adjuvant chemotherapy to reduce the risk of recurrence, and shrink the tumor before surgery. -I discussed with Dr. Barry Dienes, we recommend patient to have new adjuvant chemotherapy first, and a possible SBRT after chemo, based on his next staging scan findings after chemo.  -We discussed different neoadjuvant chemotherapy regimens, especially FOLFIRINOX or gemcitabine and Abraxane combination. Giving his much improved symptoms after his stent placement, good performance status, I recommend him to have FOLFIRINOX.  -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 that the goal of therapy is curative  -lab reviewed, his total bilirubin has come down to normal, adequate for treatment, we'll proceed with cycle 1 chemotherapy today -He has Zofran and Compazine at home as needed for nausea  2. HTN and DM - he will continue medication and follow up with his primary care physician Dr. Sarajane Jews  -We reviewed her that his blood pressure and blood glucose will need to be monitored closely during the chemotherapy, and his medication may need to be adjusted  -His blood glucose has been significantly elevated, in the 200-400mg /dl range, he started Lantus 2-3 weeks ago, I strongly suggest him to follow up with his endocrinologist or PCP Dr. Sarajane Jews   Plan -first cycle FOLFIRINOX today, with Neulasta on day 3  -Return to clinic for toxicity check her in 1 week with lab -next cycle chemo in 2 weeks   All questions were answered. The patient knows to call the  clinic with any problems, questions or concerns.  I spent 25 minutes counseling the patient face to face. The total time spent in the appointment was 30 minutes and more than 50% was on counseling.     Truitt Merle, MD 03/28/2016

## 2016-03-29 ENCOUNTER — Encounter: Payer: Self-pay | Admitting: Nurse Practitioner

## 2016-03-29 NOTE — Assessment & Plan Note (Signed)
Patient has recently been diagnosed with pancreatic cancer.  He presented to the [facility] today to receive his first cycle of Folfiri or knocks chemotherapy.  He did experience hyperglycemia while in the infusion area.  See further notes for details.  Patient is scheduled to return on 03/30/2016 for a Neulasta injection and for his pump discontinuation.

## 2016-03-29 NOTE — Assessment & Plan Note (Signed)
Patient's corrected calcium was elevated at 11.28 today.  Most likely, this is secondary to patient's disease process.  Will continue to monitor closely.

## 2016-03-29 NOTE — Assessment & Plan Note (Signed)
Patient has a history of diabetes.  He has also recently been diagnosed with pancreatic cancer.  He has noticed that his blood sugars have been more out of control recently.  Patient states that he recently changed from his oral diabetic medication to Lantus.  He states he's been taking Lantus 20 units every morning.  He has no other diabetic medication at this time.  Patient was in the midst of receiving the oxaliplatin portion of his chemotherapy today; when he became pale and diaphoretic.  He denied any other new symptoms whatsoever.  Blood sugar checked was 447.  Patient was given regular insulin 18 units subcutaneously; and blood sugar only decreased down to the upper 300s.  Patient was given additional regular insulin again; and blood sugar when rechecked was then at 442.  Patient stated that he was feeling better after receiving the insulin.  Also of note-patient did eat multiple snacks throughout the day.  Patient was able to complete all of his chemotherapy with no further issues.  Dr. [name] in to see patient as well.  This provider then called patient's primary care provider Dr. Alysia Penna; and reviewed all details of today's events with him.  He suggested that patient increase the Lantus to 20 units subcutaneously twice daily.  He also advised that patient initiated a sliding scale insulin protocol as well.  He stated that his nurse would call in the changed dosing of the Lantus and the sliding scale insulin as well.  Also recommended that patient make a follow-up appointment with him as soon as possible.  All details of the phone call with Dr. Sarajane Jews were reviewed with the patient.  He was also advised to call/return or go directly to the emergency department for any worsening symptoms whatsoever.

## 2016-03-29 NOTE — Progress Notes (Signed)
SYMPTOM MANAGEMENT CLINIC    Chief Complaint: Hyperglycemia  HPI:  Colton Bush 60 y.o. male diagnosed with pancreatic cancer.  Currently undergoing Folfirinox chemotherapy regimen.   Oncology History   Adenocarcinoma of head of pancreas [facility])   Staging form: Pancreas, AJCC 7th Edition   - Clinical stage from 03/09/2016: Stage IIB (T2, N1, M0) - Signed by Truitt Merle, MD on 03/17/2016      Adenocarcinoma of head of pancreas ([place])   02/27/2016 Imaging    CT chest, abdomen and pelvis with contrast showed ill-defined heterogeneity of pancreatic head, highly suspicious for malignancy, ) quit about and biliary ductal dilatation, mildly prominent lymph nodes in the upper abdomen, largest 2 cm in the portocaval . No other metastasis.       03/09/2016 Initial Diagnosis    Adenocarcinoma of head of pancreas ([place])      03/09/2016 Procedure    Upper EUS showed a 3.5 cm irregular mass in the pancreatic head, causing pancreatic and biliary duct obstruction. The mass involves the portal vein 422 mm, strongly suggesting invasion. There is suspicious nearby adenopathy      03/09/2016 Initial Biopsy    Fine-needle aspiration of the pancreatic mass from EUS showed malignant cells consistent with adenocarcinoma.        Review of Systems  Constitutional: Positive for malaise/fatigue and weight loss.  Skin:       Diaphoresis  All other systems reviewed and are negative.   Past Medical History:  Diagnosis Date  . Arthritis    left hand  . Bronchitis 1977  . Cancer [facility])    pancreatic cancer  . Depression    takes Cymbalta daily  . Diabetes mellitus type II    sees [name]   . GERD (gastroesophageal reflux disease)    takes Omeprazole daily  . H/O hiatal hernia   . Hyperlipidemia    takes Zocor daily  . Hypertension    takes Amlodipine daily  . Neck pain    C4-7 stenosis and herniated disc  . Neuromuscular disorder ([place])    hiatal hernia  . Scoliosis    slight  . Spinal  cord injury, C5-C7 ([place])    c4-c7  . Stiffness of hand joint    d/t cervical issues    Past Surgical History:  Procedure Laterality Date  . ANTERIOR CERVICAL DECOMP/DISCECTOMY FUSION  08/18/2011   Procedure: ANTERIOR CERVICAL DECOMPRESSION/DISCECTOMY FUSION 3 LEVELS;  Surgeon: Winfield Cunas, MD;  Location: [facility];  Service: Neurosurgery;  Laterality: N/A;  Anterior Cervical Four-Five/Five-Six/Six-Seven Decompression with Fusion, Plating, and Bonegraft  . CARPAL TUNNEL RELEASE  2013   bilateral, per Dr. Christella Noa   . COLONOSCOPY  10-30-14   per Dr. Olevia Perches, clear, repeat in 10 yrs   . egd with esophageal dilation  9-08   per Dr. Olevia Perches  . ERCP N/A 03/01/2016   Procedure: ENDOSCOPIC RETROGRADE CHOLANGIOPANCREATOGRAPHY (ERCP) with brushings and stent;  Surgeon: Doran Stabler, MD;  Location: [facility];  Service: Endoscopy;  Laterality: N/A;  . EUS N/A 03/09/2016   Procedure: ESOPHAGEAL ENDOSCOPIC ULTRASOUND (EUS) RADIAL;  Surgeon: Milus Banister, MD;  Location: [facility];  Service: Endoscopy;  Laterality: N/A;  . lymph nodes biopsy    . melanoma rt calf  1999  . PORTACATH PLACEMENT Left 03/22/2016   Procedure: INSERTION PORT-A-CATH;  Surgeon: Stark Klein, MD;  Location: [facility];  Service: General;  Laterality: Left;  . SPINE SURGERY    . TONSILLECTOMY  as a child  . ULNAR TUNNEL RELEASE  2013   right arm, per Dr. Christella Noa   . UPPER GASTROINTESTINAL ENDOSCOPY      has Hyperlipidemia, mixed; CERUMEN IMPACTION; Essential hypertension; ESOPHAGEAL STRICTURE; GERD; HIATAL HERNIA; Cervical spondylosis with myelopathy; Diabetes mellitus type 2, controlled ([place]); Obstructive jaundice due to malignant neoplasm; DKA (diabetic ketoacidoses) ([place]); Biliary obstruction; Adenocarcinoma of head of pancreas ([place]); and Hypercalcemia on his problem list.    has No Known Allergies.    Medication List       Accurate as of 03/28/16 11:59 PM. Always use your most recent med list.            amLODipine 10 MG tablet Commonly known as:  NORVASC Take 1 tablet (10 mg total) by mouth daily.   benazepril 10 MG tablet Commonly known as:  LOTENSIN Take 1 tablet (10 mg total) by mouth daily.   docusate sodium 100 MG capsule Commonly known as:  COLACE Take 1 capsule (100 mg total) by mouth 2 (two) times daily.   freestyle lancets USE AS INSTRUCTED TO CHECK BLOOD SUGAR ONCE A DAY   insulin aspart 100 UNIT/ML injection Commonly known as:  novoLOG Give every morning with breakfast and every evening with supper per the following sliding scale: give 3 units for glucose 150-175, give 6 units for glucose 176-200, give 9 units for glucose 201-250, give 12 units for glucose greater than 250   Insulin Detemir 100 UNIT/ML Pen Commonly known as:  LEVEMIR Inject 20 Units into the skin 2 (two) times daily. Inject 20 units into the skin each evening.   insulin glargine 100 UNIT/ML injection Commonly known as:  LANTUS Inject 0.2 mLs (20 Units total) into the skin 2 (two) times daily.   Insulin Pen Needle 30G X 5 MM Misc Use one daily with insulin   lidocaine-prilocaine cream Commonly known as:  EMLA Apply to affected area once   multivitamin with minerals Tabs tablet Take 1 tablet by mouth daily.   omega-3 acid ethyl esters 1 g capsule Commonly known as:  LOVAZA Take 2 g by mouth daily.   omeprazole 20 MG capsule Commonly known as:  PRILOSEC Take 20 mg by mouth daily.   ondansetron 8 MG tablet Commonly known as:  ZOFRAN Take 1 tablet (8 mg total) by mouth 2 (two) times daily as needed for refractory nausea / vomiting. Start on day 3 after chemotherapy.   oxyCODONE 5 MG immediate release tablet Commonly known as:  Oxy IR/ROXICODONE Take 1-2 tablets (5-10 mg total) by mouth every 4 (four) hours as needed for moderate pain.   polyethylene glycol packet Commonly known as:  MIRALAX / GLYCOLAX Take 17 g by mouth daily as needed for mild constipation.   prochlorperazine 10 MG  tablet Commonly known as:  COMPAZINE Take 1 tablet (10 mg total) by mouth every 6 (six) hours as needed (NAUSEA).        PHYSICAL EXAMINATION  Oncology Vitals 03/28/2016 03/28/2016  Height - -  Weight - -  Weight (lbs) - -  BMI (kg/m2) - -  Temp - 98.1  Pulse 95 88  Resp 18 18  SpO2 97 95  BSA (m2) - -   BP Readings from Last 2 Encounters:  03/28/16 (!) 149/82  03/28/16 136/80    Physical Exam  Constitutional: He is oriented to person, place, and time. Vital signs are normal. He appears malnourished. He appears unhealthy. He appears cachectic.  HENT:  Head: Normocephalic and atraumatic.  Mouth/Throat: Oropharynx is clear and moist.  Eyes: Conjunctivae and EOM are normal. Pupils are equal, round, and reactive to light. Right eye exhibits no discharge. Left eye exhibits no discharge. No scleral icterus.  Neck: Normal range of motion. Neck supple. No JVD present. No tracheal deviation present. No thyromegaly present.  Cardiovascular: Normal rate, regular rhythm, normal heart sounds and intact distal pulses.   Pulmonary/Chest: Effort normal and breath sounds normal. No respiratory distress. He has no wheezes. He has no rales. He exhibits no tenderness.  Abdominal: Soft. Bowel sounds are normal. He exhibits no distension and no mass. There is no tenderness. There is no rebound and no guarding.  Musculoskeletal: Normal range of motion. He exhibits no edema, tenderness or deformity.  Lymphadenopathy:    He has no cervical adenopathy.  Neurological: He is alert and oriented to person, place, and time. Gait normal.  Skin: Skin is warm. No rash noted. No erythema. There is pallor.  Patient was very diaphoretic.  Psychiatric: Affect normal.  Nursing note and vitals reviewed.   LABORATORY DATA:. Appointment on 03/28/2016  Component Date Value Ref Range Status  . Glucose 03/28/2016 399* 70 - 100 mg/dL Final  . HRS PC 03/28/2016 0.5  Hours Final  . Glucose 03/28/2016 442* 70 - 100  mg/dL Final  . HRS PC 03/28/2016 Unknown  Hours Final  Appointment on 03/28/2016  Component Date Value Ref Range Status  . WBC 03/28/2016 13.5* 4.0 - 10.3 10e3/uL Final  . NEUT# 03/28/2016 11.3* 1.5 - 6.5 10e3/uL Final  . HGB 03/28/2016 12.6* 13.0 - 17.1 g/dL Final  . HCT 03/28/2016 37.6* 38.4 - 49.9 % Final  . Platelets 03/28/2016 373  140 - 400 10e3/uL Final  . MCV 03/28/2016 100.4* 79.3 - 98.0 fL Final  . MCH 03/28/2016 33.6* 27.2 - 33.4 pg Final  . MCHC 03/28/2016 33.4  32.0 - 36.0 g/dL Final  . RBC 03/28/2016 3.74* 4.20 - 5.82 10e6/uL Final  . RDW 03/28/2016 13.3  11.0 - 14.6 % Final  . lymph# 03/28/2016 1.0  0.9 - 3.3 10e3/uL Final  . MONO# 03/28/2016 1.1* 0.1 - 0.9 10e3/uL Final  . Eosinophils Absolute 03/28/2016 0.0  0.0 - 0.5 10e3/uL Final  . Basophils Absolute 03/28/2016 0.1  0.0 - 0.1 10e3/uL Final  . NEUT% 03/28/2016 83.8* 39.0 - 75.0 % Final  . LYMPH% 03/28/2016 7.2* 14.0 - 49.0 % Final  . MONO% 03/28/2016 8.2  0.0 - 14.0 % Final  . EOS% 03/28/2016 0.1  0.0 - 7.0 % Final  . BASO% 03/28/2016 0.7  0.0 - 2.0 % Final  . Sodium 03/28/2016 138  136 - 145 mEq/L Final  . Potassium 03/28/2016 4.5  3.5 - 5.1 mEq/L Final  . Chloride 03/28/2016 100  98 - 109 mEq/L Final  . CO2 03/28/2016 20* 22 - 29 mEq/L Final  . Glucose 03/28/2016 385* 70 - 140 mg/dl Final  . BUN 03/28/2016 16.2  7.0 - 26.0 mg/dL Final  . Creatinine 03/28/2016 0.8  0.7 - 1.3 mg/dL Final  . Total Bilirubin 03/28/2016 1.03  0.20 - 1.20 mg/dL Final  . Alkaline Phosphatase 03/28/2016 317* 40 - 150 U/L Final  . AST 03/28/2016 19  5 - 34 U/L Final  . ALT 03/28/2016 43  0 - 55 U/L Final  . Total Protein 03/28/2016 7.9  6.4 - 8.3 g/dL Final  . Albumin 03/28/2016 2.9* 3.5 - 5.0 g/dL Final  . Calcium 03/28/2016 10.4  8.4 - 10.4 mg/dL Final  . Anion  Gap 03/28/2016 17* 3 - 11 mEq/L Final  . EGFR 03/28/2016 >90  >90 ml/min/1.73 m2 Final  . Glucose 03/28/2016 437* 70 - 100 mg/dL Final  . HRS PC 03/28/2016 0.0  Hours  Final    RADIOGRAPHIC STUDIES: No results found.  ASSESSMENT/PLAN:    Diabetes mellitus type 2, controlled ([place]) Patient has a history of diabetes.  He has also recently been diagnosed with pancreatic cancer.  He has noticed that his blood sugars have been more out of control recently.  Patient states that he recently changed from his oral diabetic medication to Lantus.  He states he's been taking Lantus 20 units every morning.  He has no other diabetic medication at this time.  Patient was in the midst of receiving the oxaliplatin portion of his chemotherapy today; when he became pale and diaphoretic.  He denied any other new symptoms whatsoever.  Blood sugar checked was 447.  Patient was given regular insulin 18 units subcutaneously; and blood sugar only decreased down to the upper 300s.  Patient was given additional regular insulin again; and blood sugar when rechecked was then at 442.  Patient stated that he was feeling better after receiving the insulin.  Also of note-patient did eat multiple snacks throughout the day.  Patient was able to complete all of his chemotherapy with no further issues.  Dr. [name] in to see patient as well.  This provider then called patient's primary care provider Dr. Alysia Penna; and reviewed all details of today's events with him.  He suggested that patient increase the Lantus to 20 units subcutaneously twice daily.  He also advised that patient initiated a sliding scale insulin protocol as well.  He stated that his nurse would call in the changed dosing of the Lantus and the sliding scale insulin as well.  Also recommended that patient make a follow-up appointment with him as soon as possible.  All details of the phone call with Dr. Sarajane Jews were reviewed with the patient.  He was also advised to call/return or go directly to the emergency department for any worsening symptoms whatsoever.    Adenocarcinoma of head of pancreas [facility]) Patient has recently been  diagnosed with pancreatic cancer.  He presented to the [facility] today to receive his first cycle of Folfiri or knocks chemotherapy.  He did experience hyperglycemia while in the infusion area.  See further notes for details.  Patient is scheduled to return on 03/30/2016 for a Neulasta injection and for his pump discontinuation.    Hypercalcemia Patient's corrected calcium was elevated at 11.28 today.  Most likely, this is secondary to patient's disease process.  Will continue to monitor closely.   Patient stated understanding of all instructions; and was in agreement with this plan of care. The patient knows to call the clinic with any problems, questions or concerns.   Total time spent with patient was 40 minutes;  with greater than 75 percent of that time spent in face to face counseling regarding patient's symptoms,  and coordination of care and follow up.  Disclaimer:This dictation was prepared with Dragon/digital dictation along with [employer]. Any transcriptional errors that result from this process are unintentional.  Colton Second, NP 03/29/2016

## 2016-03-30 ENCOUNTER — Ambulatory Visit (HOSPITAL_BASED_OUTPATIENT_CLINIC_OR_DEPARTMENT_OTHER): Payer: 59

## 2016-03-30 ENCOUNTER — Ambulatory Visit: Payer: Self-pay | Admitting: Endocrinology

## 2016-03-30 ENCOUNTER — Telehealth: Payer: Self-pay | Admitting: Hematology

## 2016-03-30 ENCOUNTER — Ambulatory Visit: Payer: 59

## 2016-03-30 VITALS — BP 103/62 | HR 88 | Temp 98.4°F | Resp 18

## 2016-03-30 DIAGNOSIS — Z5189 Encounter for other specified aftercare: Secondary | ICD-10-CM | POA: Diagnosis not present

## 2016-03-30 DIAGNOSIS — C250 Malignant neoplasm of head of pancreas: Secondary | ICD-10-CM

## 2016-03-30 MED ORDER — HEPARIN SOD (PORK) LOCK FLUSH 100 UNIT/ML IV SOLN
500.0000 [IU] | Freq: Once | INTRAVENOUS | Status: AC | PRN
  Administered 2016-03-30: 500 [IU]
  Filled 2016-03-30: qty 5

## 2016-03-30 MED ORDER — SODIUM CHLORIDE 0.9% FLUSH
10.0000 mL | INTRAVENOUS | Status: DC | PRN
  Administered 2016-03-30: 10 mL
  Filled 2016-03-30: qty 10

## 2016-03-30 MED ORDER — PEGFILGRASTIM INJECTION 6 MG/0.6ML ~~LOC~~
6.0000 mg | PREFILLED_SYRINGE | Freq: Once | SUBCUTANEOUS | Status: AC
  Administered 2016-03-30: 6 mg via SUBCUTANEOUS
  Filled 2016-03-30: qty 0.6

## 2016-03-30 NOTE — Progress Notes (Signed)
Neulasta Injection was given in Flush Room # 1

## 2016-03-30 NOTE — Telephone Encounter (Signed)
PATIENT STOPPED BY FOR September SCHEDULE AND APPOINTMENTS AFTER PUMP D/C TODAY.

## 2016-03-31 ENCOUNTER — Other Ambulatory Visit: Payer: Self-pay | Admitting: Endocrinology

## 2016-04-04 ENCOUNTER — Encounter: Payer: Self-pay | Admitting: Hematology

## 2016-04-04 ENCOUNTER — Ambulatory Visit (HOSPITAL_BASED_OUTPATIENT_CLINIC_OR_DEPARTMENT_OTHER): Payer: 59 | Admitting: Hematology

## 2016-04-04 ENCOUNTER — Other Ambulatory Visit (HOSPITAL_BASED_OUTPATIENT_CLINIC_OR_DEPARTMENT_OTHER): Payer: 59

## 2016-04-04 VITALS — BP 114/61 | HR 92 | Temp 98.5°F | Resp 17 | Ht 70.0 in | Wt 181.1 lb

## 2016-04-04 DIAGNOSIS — C250 Malignant neoplasm of head of pancreas: Secondary | ICD-10-CM | POA: Diagnosis not present

## 2016-04-04 DIAGNOSIS — E118 Type 2 diabetes mellitus with unspecified complications: Secondary | ICD-10-CM

## 2016-04-04 DIAGNOSIS — I10 Essential (primary) hypertension: Secondary | ICD-10-CM

## 2016-04-04 DIAGNOSIS — R634 Abnormal weight loss: Secondary | ICD-10-CM

## 2016-04-04 LAB — CBC WITH DIFFERENTIAL/PLATELET
BASO%: 0.2 % (ref 0.0–2.0)
Basophils Absolute: 0 10*3/uL (ref 0.0–0.1)
EOS%: 1.9 % (ref 0.0–7.0)
Eosinophils Absolute: 0.2 10*3/uL (ref 0.0–0.5)
HCT: 35.8 % — ABNORMAL LOW (ref 38.4–49.9)
HGB: 11.9 g/dL — ABNORMAL LOW (ref 13.0–17.1)
LYMPH%: 8.8 % — ABNORMAL LOW (ref 14.0–49.0)
MCH: 32.2 pg (ref 27.2–33.4)
MCHC: 33.2 g/dL (ref 32.0–36.0)
MCV: 97 fL (ref 79.3–98.0)
MONO#: 0.7 10*3/uL (ref 0.1–0.9)
MONO%: 5.2 % (ref 0.0–14.0)
NEUT#: 10.5 10*3/uL — ABNORMAL HIGH (ref 1.5–6.5)
NEUT%: 83.9 % — ABNORMAL HIGH (ref 39.0–75.0)
Platelets: 441 10*3/uL — ABNORMAL HIGH (ref 140–400)
RBC: 3.69 10*6/uL — ABNORMAL LOW (ref 4.20–5.82)
RDW: 13.7 % (ref 11.0–14.6)
WBC: 12.5 10*3/uL — ABNORMAL HIGH (ref 4.0–10.3)
lymph#: 1.1 10*3/uL (ref 0.9–3.3)

## 2016-04-04 LAB — COMPREHENSIVE METABOLIC PANEL
ALT: 27 U/L (ref 0–55)
AST: 19 U/L (ref 5–34)
Albumin: 2.8 g/dL — ABNORMAL LOW (ref 3.5–5.0)
Alkaline Phosphatase: 269 U/L — ABNORMAL HIGH (ref 40–150)
Anion Gap: 12 mEq/L — ABNORMAL HIGH (ref 3–11)
BUN: 15.5 mg/dL (ref 7.0–26.0)
CO2: 21 mEq/L — ABNORMAL LOW (ref 22–29)
Calcium: 9.3 mg/dL (ref 8.4–10.4)
Chloride: 102 mEq/L (ref 98–109)
Creatinine: 0.8 mg/dL (ref 0.7–1.3)
EGFR: >90 mL/min/{1.73_m2} (ref >90)
Glucose: 549 mg/dl — ABNORMAL HIGH (ref 70–140)
Potassium: 4.6 mEq/L (ref 3.5–5.1)
Sodium: 135 mEq/L — ABNORMAL LOW (ref 136–145)
Total Bilirubin: 0.57 mg/dL (ref 0.20–1.20)
Total Protein: 7 g/dL (ref 6.4–8.3)

## 2016-04-04 NOTE — Progress Notes (Signed)
[place]  [phone] [phone]  Clinic Follow up Note   Patient Care Team: Colton Morale, MD as PCP - General 04/04/2016  CHIEF COMPLAINTS:  Follow up pancreatic cancer  Oncology History   Adenocarcinoma of head of pancreas [facility])   Staging form: Pancreas, AJCC 7th Edition   - Clinical stage from 03/09/2016: Stage IIB (T2, N1, M0) - Signed by Colton Merle, MD on 03/17/2016      Adenocarcinoma of head of pancreas ([place])   02/27/2016 Imaging    CT chest, abdomen and pelvis with contrast showed ill-defined heterogeneity of pancreatic head, highly suspicious for malignancy, ) quit about and biliary ductal dilatation, mildly prominent lymph nodes in the upper abdomen, largest 2 cm in the portocaval . No other metastasis.       03/09/2016 Initial Diagnosis    Adenocarcinoma of head of pancreas ([place])      03/09/2016 Procedure    Upper EUS showed a 3.5 cm irregular mass in the pancreatic head, causing pancreatic and biliary duct obstruction. The mass involves the portal vein 422 mm, strongly suggesting invasion. There is suspicious nearby adenopathy      03/09/2016 Initial Biopsy    Fine-needle aspiration of the pancreatic mass from EUS showed malignant cells consistent with adenocarcinoma.       03/28/2016 -  Chemotherapy    neoadjuvant chemo with FOLFIRINOX every 2 weeks       HISTORY OF PRESENTING ILLNESS:  Colton Bush 60 y.o. male is here because of His newly diagnosed pancreatic cancer. He is accompanied by his wife to our multidisciplinary GI clinic today.  He noticed fatigue, anorexia and weight loss about 50bs in the past 4 months. It started when his endocrinologist changed his diabetic meds. He presented to hospital on 7/30 with jaundice. No pain or nausea. CT scan revealed a heterogeneous pancreatic head mass, highly suspicious for malignancy. Mild biliary and pancreatic duct dilatation, mildly prominent lymph nodes in the upper  abdomen. He underwent ERCP and CBD stent placement, and subsequent upper EUS on 03/09/2016, which showed a 3.5 cm irregular mass in the pancreatic head, the mass involves the portal vein, no suspicious nearby adenopathy.  He has been feeling better after stent placement, with improved appetite and energy level. He feels normal again. No symptoms. BM is good, he has gained about 8lbs back.   CURRENT THERAPY: neoadjuvant chemo with FOLFIRINOX every 2 weeks, starting on 03/28/2016  INTERIM HISTORY: MR. Colton Bush returns for follow up and a toxicity check up after his first cycle chemotherapy. He tolerated first cycle chemotherapy well, has slightly worsening fatigue, no dyspnea, no significant nausea or diarrhea, his bowel movement has been normal. His appetite has not been affected by chemotherapy much, he is eating well overall. His blood glucose has been very high, was found in 500's range last week, we contacted his primary care physician Dr. Sarajane Bush, was started him on insulin sliding scale, in addition to levemir twice daily (on 30u bid now since last week). He is not very comfortable about his diet. He states he been drinking adequately, 50-60 ounces daily, no fever or chills. He lost about 2 pounds in the past one week.  MEDICAL HISTORY:  Past Medical History:  Diagnosis Date  . Arthritis    left hand  . Bronchitis 1977  . Cancer [facility])    pancreatic cancer  . Depression    takes Cymbalta daily  . Diabetes mellitus type II    sees  Dr. Dwyane Bush   . GERD (gastroesophageal reflux disease)    takes Omeprazole daily  . H/O hiatal hernia   . Hyperlipidemia    takes Zocor daily  . Hypertension    takes Amlodipine daily  . Neck pain    C4-7 stenosis and herniated disc  . Neuromuscular disorder ([place])    hiatal hernia  . Scoliosis    slight  . Spinal cord injury, C5-C7 ([place])    c4-c7  . Stiffness of hand joint    d/t cervical issues    SURGICAL HISTORY: Past Surgical History:  Procedure  Laterality Date  . ANTERIOR CERVICAL DECOMP/DISCECTOMY FUSION  08/18/2011   Procedure: ANTERIOR CERVICAL DECOMPRESSION/DISCECTOMY FUSION 3 LEVELS;  Surgeon: Colton Cunas, MD;  Location: [place] [facility]URO ORS;  Service: Neurosurgery;  Laterality: N/A;  Anterior Cervical Four-Five/Five-Six/Six-Seven Decompression with Fusion, Plating, and Bonegraft  . CARPAL TUNNEL RELEASE  2013   bilateral, per Dr. Christella Bush   . COLONOSCOPY  10-30-14   per Dr. Olevia Bush, clear, repeat in 10 yrs   . egd with esophageal dilation  9-08   per Dr. Olevia Bush  . ERCP N/A 03/01/2016   Procedure: ENDOSCOPIC RETROGRADE CHOLANGIOPANCREATOGRAPHY (ERCP) with brushings and stent;  Surgeon: Colton Stabler, MD;  Location: [facility] ENDOSCOPY;  Service: Endoscopy;  Laterality: N/A;  . EUS N/A 03/09/2016   Procedure: ESOPHAGEAL ENDOSCOPIC ULTRASOUND (EUS) RADIAL;  Surgeon: Colton Banister, MD;  Location: [facility] ENDOSCOPY;  Service: Endoscopy;  Laterality: N/A;  . lymph nodes biopsy    . melanoma rt calf  1999  . PORTACATH PLACEMENT Left 03/22/2016   Procedure: INSERTION PORT-A-CATH;  Surgeon: Colton Klein, MD;  Location: [facility] ORS;  Service: General;  Laterality: Left;  . SPINE SURGERY    . TONSILLECTOMY     as a child  . ULNAR TUNNEL RELEASE  2013   right arm, per Dr. Christella Bush   . UPPER GASTROINTESTINAL ENDOSCOPY      SOCIAL HISTORY: Social History   Social History  . Marital status: Married    Spouse name: Colton Bush  . Number of children: N/A  . Years of education: N/A   Occupational History  . Driver    Social History Main Topics  . Smoking status: Never Smoker  . Smokeless tobacco: Never Used     Comment: tried a pipe 35 years ago   . Alcohol use No  . Drug use: No  . Sexual activity: Yes   Other Topics Concern  . Not on file   Social History Narrative   Married, wife Colton Bush Nutritional therapist-   He works as a Geophysicist/field seismologist for a company  He has one son 68 yo. Lives with his wife   FAMILY HISTORY: Family History  Problem Relation Age  of Onset  . Heart disease Father   . Heart disease Brother 42  . Anesthesia problems Mother   . Heart disease Mother   . Dementia Mother   . Diabetes Sister   . Stroke Sister   . Colon cancer Neg Hx   . Rectal cancer Neg Hx   . Stomach cancer Neg Hx     ALLERGIES:  has No Known Allergies.  MEDICATIONS:  Current Outpatient Prescriptions  Medication Sig Dispense Refill  . amLODipine (NORVASC) 10 MG tablet Take 1 tablet (10 mg total) by mouth daily. 30 tablet 1  . benazepril (LOTENSIN) 10 MG tablet Take 1 tablet (10 mg total) by mouth daily. 30 tablet 1  . docusate sodium (COLACE)  100 MG capsule Take 1 capsule (100 mg total) by mouth 2 (two) times daily. 10 capsule 0  . insulin aspart (NOVOLOG) 100 UNIT/ML injection Give every morning with breakfast and every evening with supper per the following sliding scale: give 3 units for glucose 150-175, give 6 units for glucose 176-200, give 9 units for glucose 201-250, give 12 units for glucose greater than 250 3 vial 11  . Insulin Detemir (LEVEMIR) 100 UNIT/ML Pen Inject 20 Units into the skin 2 (two) times daily. Inject 20 units into the skin each evening. 30 mL 3  . insulin glargine (LANTUS) 100 UNIT/ML injection Inject 0.2 mLs (20 Units total) into the skin 2 (two) times daily. 10 mL 11  . Insulin Pen Needle 30G X 5 MM MISC Use one daily with insulin 100 each 2  . JARDIANCE 25 MG TABS tablet TAKE 1 TABLET BY MOUTH EVERY DAY 90 tablet 1  . Lancets (FREESTYLE) lancets USE AS INSTRUCTED TO CHECK BLOOD SUGAR ONCE A DAY 100 each 1  . lidocaine-prilocaine (EMLA) cream Apply to affected area once (Patient taking differently: Apply 1 application topically as needed (for port). ) 30 g 3  . Multiple Vitamin (MULTIVITAMIN WITH MINERALS) TABS tablet Take 1 tablet by mouth daily.    Marland Kitchen omega-3 acid ethyl esters (LOVAZA) 1 g capsule Take 2 g by mouth daily.    Marland Kitchen omeprazole (PRILOSEC) 20 MG capsule Take 20 mg by mouth daily.    . ondansetron (ZOFRAN) 8 MG  tablet Take 1 tablet (8 mg total) by mouth 2 (two) times daily as needed for refractory nausea / vomiting. Start on day 3 after chemotherapy. 30 tablet 1  . oxyCODONE (OXY IR/ROXICODONE) 5 MG immediate release tablet Take 1-2 tablets (5-10 mg total) by mouth every 4 (four) hours as needed for moderate pain. 30 tablet 0  . polyethylene glycol (MIRALAX / GLYCOLAX) packet Take 17 g by mouth daily as needed for mild constipation. 14 each 0  . prochlorperazine (COMPAZINE) 10 MG tablet Take 1 tablet (10 mg total) by mouth every 6 (six) hours as needed (NAUSEA). 30 tablet 1   No current facility-administered medications for this visit.     REVIEW OF SYSTEMS:   Constitutional: Denies fevers, chills or abnormal night sweats Eyes: Denies blurriness of vision, double vision or watery eyes Ears, nose, mouth, throat, and face: Denies mucositis or sore throat Respiratory: Denies cough, dyspnea or wheezes Cardiovascular: Denies palpitation, chest discomfort or lower extremity swelling Gastrointestinal:  Denies nausea, heartburn or change in bowel habits Skin: Denies abnormal skin rashes Lymphatics: Denies new lymphadenopathy or easy bruising Neurological:Denies numbness, tingling or new weaknesses Behavioral/Psych: Mood is stable, no new changes  All other systems were reviewed with the patient and are negative.  PHYSICAL EXAMINATION: ECOG PERFORMANCE STATUS: 1  Vitals:   04/04/16 0950  BP: 114/61  Pulse: 92  Resp: 17  Temp: 98.5 F (36.9 C)   Filed Weights   04/04/16 0950  Weight: 181 lb 1.6 oz (82.1 kg)    GENERAL:alert, no distress and comfortable SKIN: skin color, texture, turgor are normal, no rashes or significant lesions EYES: normal, conjunctiva are pink and non-injected, sclera clear OROPHARYNX:no exudate, no erythema and lips, buccal mucosa, and tongue normal  NECK: supple, thyroid normal size, non-tender, without nodularity LYMPH:  no palpable lymphadenopathy in the cervical,  axillary or inguinal LUNGS: clear to auscultation and percussion with normal breathing effort HEART: regular rate & rhythm and no murmurs and no lower extremity  edema ABDOMEN:abdomen soft, non-tender and normal bowel sounds Musculoskeletal:no cyanosis of digits and no clubbing  PSYCH: alert & oriented x 3 with fluent speech NEURO: no focal motor/sensory deficits  LABORATORY DATA:  I have reviewed the data as listed CBC Latest Ref Rng & Units 04/04/2016 03/28/2016 03/24/2016  WBC 4.0 - 10.3 10e3/uL 12.5(H) 13.5(H) 10.4(H)  Hemoglobin 13.0 - 17.1 g/dL 11.9(L) 12.6(L) 13.0  Hematocrit 38.4 - 49.9 % 35.8(L) 37.6(L) 38.6  Platelets 140 - 400 10e3/uL 441(H) 373 284   CMP Latest Ref Rng & Units 04/04/2016 03/28/2016 03/24/2016  Glucose 70 - 140 mg/dl 549(H) 385(H) 322(H)  BUN 7.0 - 26.0 mg/dL 15.5 16.2 14.2  Creatinine 0.7 - 1.3 mg/dL 0.8 0.8 0.8  Sodium 136 - 145 mEq/L 135(L) 138 138  Potassium 3.5 - 5.1 mEq/L 4.6 4.5 4.8  Chloride 101 - 111 mmol/L - - -  CO2 22 - 29 mEq/L 21(L) 20(L) 24  Calcium 8.4 - 10.4 mg/dL 9.3 10.4 10.1  Total Protein 6.4 - 8.3 g/dL 7.0 7.9 7.8  Total Bilirubin 0.20 - 1.20 mg/dL 0.57 1.03 1.59(H)  Alkaline Phos 40 - 150 U/L 269(H) 317(H) 319(H)  AST 5 - 34 U/L 19 19 33  ALT 0 - 55 U/L 27 43 72(H)   PATHOLOGY REPORT  Diagnosis 03/09/2016 FINE NEEDLE ASPIRATION, ENDOSCOPIC, PANCREAS (SPECIMEN 1 OF 1 COLLECTED 03/09/16): MALIGNANT CELLS CONSISTENT WITH ADENOCARCINOMA. BACKGROUND NECROTIC DEBRIS AND ACUTE INFLAMMATION.  Diagnosis 03/01/2016 BILE DUCT BRUSHING(SPECIMEN 1 OF 1 COLLECTED 03/01/16): RARE ATYPICAL CELLS, SEE COMMENT.  RADIOGRAPHIC STUDIES: I have personally reviewed the radiological images as listed and agreed with the findings in the report. Dg Chest Port 1 View  Result Date: 03/22/2016 CLINICAL DATA:  Pancreatic carcinoma and status post Port-A-Cath placement. EXAM: PORTABLE CHEST 1 VIEW COMPARISON:  08/16/2011 FINDINGS: Left-sided subclavian venous  Port-A-Cath has been placed with the catheter tip in the SVC. No pneumothorax identified. Mild scarring is present at both lung bases. There is no evidence of pulmonary edema, consolidation, nodule or pleural fluid. The heart size and mediastinal contours are within normal limits. The visualized skeletal structures are unremarkable. IMPRESSION: Status post Port-A-Cath placement with catheter tip in the SVC. No acute findings after placement. Electronically Signed   By: Aletta Edouard M.D.   On: 03/22/2016 12:42   EUS 03/09/2016 Dr. Ardis Hughs  - 3.5cm irregularly shaped, poorly defined mass in the pancreatic head. There is suspicious, nearby adenopathy. The mass is causing pancreatic and bililary duct obstruction (previously stented). The mass involves the portal vein for 48mm (abuttment and loss of usual tissue interface), strongly suggesting invasion. Preliminary cytology results are positive for malignancy (adenocarcinoma), await final report.  ASSESSMENT & PLAN: 60 year old Caucasian male, with past medical history of diabetes and hypertension, presented with epigastric pain, weight loss, and jaundice.  1. Primary pancreatic adenocarcinoma, in pancreas head, cT2N1M0, stage IIB, borderline resectable  -I have reviewed his CT abdomen and pelvis with contrast, EUS and biopsy findings with patient and his wife in great details. -His case was reviewed in our GI tumor board a few days ago. CT scan and US showed possible portal vein invasion from the pancreatic tumor, this is borderline resectable disease. No evidence of distant metastasis.  -We reviewed the nature history of pancreatic cancer, and the overall survival rate with chemotherapy, surgery, and radiation. Patient and his wife was discouraged by the overall dismal long term survival rate (~20%)  -He was seen by surgeon Dr. Barry Dienes today, Whipple surgery was discussed and  offered to patient.  -I reviewed the high risk of cancer recurrence after  surgery, and the role of neoadjuvant and adjuvant chemotherapy to reduce the risk of recurrence, and shrink the tumor before surgery. -I discussed with Dr. Barry Dienes, we recommend patient to have new adjuvant chemotherapy first, and a possible SBRT after chemo, based on his next staging scan findings after chemo.  -He has started neoadjuvant chemotherapy with FOLFIRINOX last week, tolerated well overall. Laboratory results reviewed with him  2. Poorly controlled DM - he will continue medication and follow up with his primary care physician Dr. Sarajane Bush  -We reviewed that his blood glucose will need to be monitored closely during the chemotherapy, and his medication may need to be adjusted  -His blood glucose has been significantly elevated, fasting in 150-mid 200s, postprandial 549 today, I strongly encouraged him to call Dr. Barbie Banner office today, and get instructions about his Levemir and NovoLog dose adjustment -We discussed a diabetic diet, and drink water adequately  3. HTN -He is on amlodipine and benazepril  -His blood pressure is normal today, but slightly lower than last week. -The discussed chemotherapy may affect his blood pressure, and he is medication may need to be adjusted -I encouraged him to call Dr. Sarajane Bush, I recommend him to hold amlodipine for now, and monitor his blood pressure at home. I given him a prescription of blood pressure meter today  Plan -He will call Dr. Barbie Banner office for appointment and insulin dose adjustment -I suggest him to hold  amlodipine for now, he will confirm with Dr. Sarajane Bush -I'll see him back next week for cycle 2 chemotherapy   All questions were answered. The patient knows to call the clinic with any problems, questions or concerns.  I spent 15 minutes counseling the patient face to face. The total time spent in the appointment was 20 minutes and more than 50% was on counseling.     Colton Merle, MD 04/04/2016

## 2016-04-05 ENCOUNTER — Encounter: Payer: Self-pay | Admitting: Nutrition

## 2016-04-05 ENCOUNTER — Encounter: Payer: Self-pay | Admitting: *Deleted

## 2016-04-05 NOTE — Progress Notes (Signed)
Patient contacted me with questions about diabetic diet. Reports his blood sugars have been elevated. Provided appropriate carbohydrate controlled diet education and specified the importance of not drinking fruit juice and only eating fresh fruits at mealtimes with other sources of protein and fat to improve glycemic control. Also encouraged patient to count carbohydrates at mealtimes so as not to consume too many carbohydrates at any one time. Recommended patient be referred to nutrition and diabetes education services, for further education on diabetic diet.

## 2016-04-05 NOTE — Progress Notes (Signed)
[place] Work  Clinical Social Work was referred by patient for assessment of psychosocial needs due to financial concerns, employment questions and insurance concerns.  Clinical Social Worker spoke with patient at length to offer support and assess for needs. Pt very concerned about his FMLA ending on 05/24/16. Pt very concerned he will not be able to return to work at the end of his leave. He reports to still be facing radiation and surgery. Pt has requested light duty through his employer and was denied this option currently. CSW problem solved at length with pt and he is really struggling with employment decisions, finances and adjustment to illness. CSW discussed procedures for FMLA and how to possibly follow up through his HR. CSW encouraged pt to reach out to his HR, insurance company and financial advocate for some additional information.  CSW will reach out to pt tomorrow via phone to follow up on other possible resources and courses of action.     Clinical Social Work interventions: Supportive Psychiatric nurse education and referral  Loren Racer, [place] Worker [place]  [place] Phone: [phone] Fax: [phone]

## 2016-04-06 ENCOUNTER — Emergency Department (HOSPITAL_COMMUNITY): Admission: EM | Admit: 2016-04-06 | Discharge: 2016-04-06 | Discharge status: Absent without leave | Payer: 59

## 2016-04-06 ENCOUNTER — Encounter: Payer: Self-pay | Admitting: Hematology

## 2016-04-06 NOTE — ED Notes (Signed)
Pt did not want to wait and his "temp has come down"

## 2016-04-06 NOTE — Progress Notes (Signed)
Patient called and left a voicemail  that his wife and him decided to decline the one-time [facility] $400 grant. Called patient to confirm I received the message and asked patient did he need to still keep his appointment with me on 9/12 to discuss anything else and he said no. Advised patient I would cancel his Financial Counseling appointment with me for that day. Patient verbalized understanding. Patient has my contact information for any additional financial questions or concerns.

## 2016-04-07 ENCOUNTER — Encounter: Payer: Self-pay | Admitting: *Deleted

## 2016-04-07 NOTE — Progress Notes (Signed)
[name]  Clinical Social Work contacted pt via phone for follow up as planned. Pt has reached out to his HR manager and he still is quite concerned about his finances. CSW noticed pt cancelled his appointment with financial counselor. Pt plans to get additional paperwork to his employer completed to possibly extend his leave. Pt sounded in better spirits today as he had worked on a Midwife with his wife. Pt encouraged to reach out as needed and to consider support programs as well. CSW team to follow up as needed.    Clinical Social Work interventions: Check in  Colton Bush, [place] Social Worker [place]  [place] Phone: [phone] Fax: [phone]

## 2016-04-08 ENCOUNTER — Other Ambulatory Visit: Payer: Self-pay | Admitting: Endocrinology

## 2016-04-11 ENCOUNTER — Ambulatory Visit: Payer: Self-pay

## 2016-04-11 ENCOUNTER — Telehealth: Payer: Self-pay | Admitting: *Deleted

## 2016-04-11 ENCOUNTER — Ambulatory Visit (HOSPITAL_BASED_OUTPATIENT_CLINIC_OR_DEPARTMENT_OTHER): Payer: 59

## 2016-04-11 ENCOUNTER — Other Ambulatory Visit (HOSPITAL_BASED_OUTPATIENT_CLINIC_OR_DEPARTMENT_OTHER): Payer: 59

## 2016-04-11 ENCOUNTER — Ambulatory Visit: Payer: 59

## 2016-04-11 ENCOUNTER — Ambulatory Visit (HOSPITAL_BASED_OUTPATIENT_CLINIC_OR_DEPARTMENT_OTHER): Payer: 59 | Admitting: Hematology

## 2016-04-11 ENCOUNTER — Encounter: Payer: Self-pay | Admitting: Hematology

## 2016-04-11 ENCOUNTER — Telehealth: Payer: Self-pay | Admitting: Hematology

## 2016-04-11 VITALS — BP 129/74 | HR 79 | Temp 97.6°F | Resp 18 | Ht 70.0 in | Wt 186.8 lb

## 2016-04-11 DIAGNOSIS — I10 Essential (primary) hypertension: Secondary | ICD-10-CM

## 2016-04-11 DIAGNOSIS — C250 Malignant neoplasm of head of pancreas: Secondary | ICD-10-CM | POA: Diagnosis not present

## 2016-04-11 DIAGNOSIS — E119 Type 2 diabetes mellitus without complications: Secondary | ICD-10-CM

## 2016-04-11 DIAGNOSIS — Z794 Long term (current) use of insulin: Secondary | ICD-10-CM | POA: Diagnosis not present

## 2016-04-11 DIAGNOSIS — Z5111 Encounter for antineoplastic chemotherapy: Secondary | ICD-10-CM | POA: Diagnosis not present

## 2016-04-11 DIAGNOSIS — IMO0001 Reserved for inherently not codable concepts without codable children: Secondary | ICD-10-CM

## 2016-04-11 DIAGNOSIS — Z95828 Presence of other vascular implants and grafts: Secondary | ICD-10-CM

## 2016-04-11 DIAGNOSIS — E1165 Type 2 diabetes mellitus with hyperglycemia: Secondary | ICD-10-CM

## 2016-04-11 LAB — CBC WITH DIFFERENTIAL/PLATELET
BASO%: 1.3 % (ref 0.0–2.0)
Basophils Absolute: 0.2 10*3/uL — ABNORMAL HIGH (ref 0.0–0.1)
EOS%: 0.9 % (ref 0.0–7.0)
Eosinophils Absolute: 0.2 10*3/uL (ref 0.0–0.5)
HCT: 33.4 % — ABNORMAL LOW (ref 38.4–49.9)
HGB: 11 g/dL — ABNORMAL LOW (ref 13.0–17.1)
LYMPH%: 11.5 % — ABNORMAL LOW (ref 14.0–49.0)
MCH: 31.7 pg (ref 27.2–33.4)
MCHC: 33 g/dL (ref 32.0–36.0)
MCV: 96 fL (ref 79.3–98.0)
MONO#: 1 10*3/uL — ABNORMAL HIGH (ref 0.1–0.9)
MONO%: 5.1 % (ref 0.0–14.0)
NEUT#: 15.1 10*3/uL — ABNORMAL HIGH (ref 1.5–6.5)
NEUT%: 81.2 % — ABNORMAL HIGH (ref 39.0–75.0)
Platelets: 316 10*3/uL (ref 140–400)
RBC: 3.48 10*6/uL — ABNORMAL LOW (ref 4.20–5.82)
RDW: 15.1 % — ABNORMAL HIGH (ref 11.0–14.6)
WBC: 18.6 10*3/uL — ABNORMAL HIGH (ref 4.0–10.3)
lymph#: 2.1 10*3/uL (ref 0.9–3.3)

## 2016-04-11 LAB — COMPREHENSIVE METABOLIC PANEL
ALT: 47 U/L (ref 0–55)
AST: 36 U/L — ABNORMAL HIGH (ref 5–34)
Albumin: 2.6 g/dL — ABNORMAL LOW (ref 3.5–5.0)
Alkaline Phosphatase: 234 U/L — ABNORMAL HIGH (ref 40–150)
Anion Gap: 10 mEq/L (ref 3–11)
BUN: 13.8 mg/dL (ref 7.0–26.0)
CO2: 27 mEq/L (ref 22–29)
Calcium: 9.3 mg/dL (ref 8.4–10.4)
Chloride: 104 mEq/L (ref 98–109)
Creatinine: 0.6 mg/dL — ABNORMAL LOW (ref 0.7–1.3)
EGFR: >90 mL/min/{1.73_m2} (ref >90)
Glucose: 128 mg/dl (ref 70–140)
Potassium: 3.6 mEq/L (ref 3.5–5.1)
Sodium: 141 mEq/L (ref 136–145)
Total Bilirubin: 0.45 mg/dL (ref 0.20–1.20)
Total Protein: 7 g/dL (ref 6.4–8.3)

## 2016-04-11 MED ORDER — FLUOROURACIL CHEMO INJECTION 5 GM/100ML
2400.0000 mg/m2 | INTRAVENOUS | Status: DC
  Administered 2016-04-11: 4850 mg via INTRAVENOUS
  Filled 2016-04-11: qty 97

## 2016-04-11 MED ORDER — SODIUM CHLORIDE 0.9 % IV SOLN
4.0000 mg | Freq: Once | INTRAVENOUS | Status: AC
  Administered 2016-04-11: 4 mg via INTRAVENOUS
  Filled 2016-04-11: qty 0.4

## 2016-04-11 MED ORDER — PALONOSETRON HCL INJECTION 0.25 MG/5ML
INTRAVENOUS | Status: AC
  Filled 2016-04-11: qty 5

## 2016-04-11 MED ORDER — SODIUM CHLORIDE 0.9 % IV SOLN
400.0000 mg/m2 | Freq: Once | INTRAVENOUS | Status: AC
  Administered 2016-04-11: 812 mg via INTRAVENOUS
  Filled 2016-04-11: qty 40.6

## 2016-04-11 MED ORDER — ATROPINE SULFATE 1 MG/ML IJ SOLN
0.5000 mg | Freq: Once | INTRAMUSCULAR | Status: AC | PRN
  Administered 2016-04-11: 0.5 mg via INTRAVENOUS

## 2016-04-11 MED ORDER — ATROPINE SULFATE 1 MG/ML IJ SOLN
INTRAMUSCULAR | Status: AC
  Filled 2016-04-11: qty 1

## 2016-04-11 MED ORDER — SODIUM CHLORIDE 0.9 % IV SOLN
180.0000 mg/m2 | Freq: Once | INTRAVENOUS | Status: AC
  Administered 2016-04-11: 360 mg via INTRAVENOUS
  Filled 2016-04-11: qty 10

## 2016-04-11 MED ORDER — OXALIPLATIN CHEMO INJECTION 100 MG/20ML
85.0000 mg/m2 | Freq: Once | INTRAVENOUS | Status: AC
  Administered 2016-04-11: 175 mg via INTRAVENOUS
  Filled 2016-04-11: qty 35

## 2016-04-11 MED ORDER — PALONOSETRON HCL INJECTION 0.25 MG/5ML
0.2500 mg | Freq: Once | INTRAVENOUS | Status: AC
  Administered 2016-04-11: 0.25 mg via INTRAVENOUS

## 2016-04-11 MED ORDER — SODIUM CHLORIDE 0.9 % IJ SOLN
10.0000 mL | INTRAMUSCULAR | Status: DC | PRN
  Administered 2016-04-11: 10 mL via INTRAVENOUS
  Filled 2016-04-11: qty 10

## 2016-04-11 MED ORDER — DEXTROSE 5 % IV SOLN
Freq: Once | INTRAVENOUS | Status: AC
  Administered 2016-04-11: 13:00:00 via INTRAVENOUS

## 2016-04-11 MED ORDER — FLUOROURACIL CHEMO INJECTION 2.5 GM/50ML
400.0000 mg/m2 | Freq: Once | INTRAVENOUS | Status: AC
  Administered 2016-04-11: 800 mg via INTRAVENOUS
  Filled 2016-04-11: qty 16

## 2016-04-11 NOTE — Telephone Encounter (Signed)
Message sent to infusion scheduler to add infusion. Avs report and schedule given per 04/11/16 los.

## 2016-04-11 NOTE — Telephone Encounter (Signed)
Per LOS I have scheduled appts, notified the scheduler  

## 2016-04-11 NOTE — Progress Notes (Signed)
[place]  [phone]) [phone] [phone]) [phone]  Clinic Follow up Note   Patient Care Team: [name], MD as PCP - General 04/11/2016  CHIEF COMPLAINTS:  Follow up pancreatic cancer  Oncology History   Adenocarcinoma of head of pancreas [facility])   Staging form: Pancreas, AJCC 7th Edition   - Clinical stage from 03/09/2016: Stage IIB (T2, N1, M0) - Signed by Truitt Merle, MD on 03/17/2016      Adenocarcinoma of head of pancreas ([place])   02/27/2016 Imaging    CT chest, abdomen and pelvis with contrast showed ill-defined heterogeneity of pancreatic head, highly suspicious for malignancy, ) quit about and biliary ductal dilatation, mildly prominent lymph nodes in the upper abdomen, largest 2 cm in the portocaval . No other metastasis.       03/09/2016 Initial Diagnosis    Adenocarcinoma of head of pancreas ([place])      03/09/2016 Procedure    Upper EUS showed a 3.5 cm irregular mass in the pancreatic head, causing pancreatic and biliary duct obstruction. The mass involves the portal vein 422 mm, strongly suggesting invasion. There is suspicious nearby adenopathy      03/09/2016 Initial Biopsy    Fine-needle aspiration of the pancreatic mass from EUS showed malignant cells consistent with adenocarcinoma.       03/28/2016 -  Chemotherapy    neoadjuvant chemo with FOLFIRINOX every 2 weeks       HISTORY OF PRESENTING ILLNESS:  Colton Bush 60 y.o. male is here because of His newly diagnosed pancreatic cancer. He is accompanied by his wife to our multidisciplinary GI clinic today.  He noticed fatigue, anorexia and weight loss about 50bs in the past 4 months. It started when his endocrinologist changed his diabetic meds. He presented to hospital on 7/30 with jaundice. No pain or nausea. CT scan revealed a heterogeneous pancreatic head mass, highly suspicious for malignancy. Mild biliary and pancreatic duct dilatation, mildly prominent lymph nodes in the upper  abdomen. He underwent ERCP and CBD stent placement, and subsequent upper EUS on 03/09/2016, which showed a 3.5 cm irregular mass in the pancreatic head, the mass involves the portal vein, no suspicious nearby adenopathy.  He has been feeling better after stent placement, with improved appetite and energy level. He feels normal again. No symptoms. BM is good, he has gained about 8lbs back.   CURRENT THERAPY: neoadjuvant chemo with FOLFIRINOX every 2 weeks, starting on 03/28/2016  INTERIM HISTORY: Colton Bush returns for follow up and second cycle chemo. He had an episode of transient fever last week, which resolved when was seen in the emergency room. He was not neutropenic, was discharged home. He otherwise has been doing well overall. He has changed his diet, try to eat whole wheat food, less carbohydrate, and his blood glucose has been in the range of 100-200 . He has gained 5 pounds in the past week, no other new complaints.  MEDICAL HISTORY:  Past Medical History:  Diagnosis Date  . Arthritis    left hand  . Bronchitis 1977  . Cancer [facility])    pancreatic cancer  . Depression    takes Cymbalta daily  . Diabetes mellitus type II    sees [name]   . GERD (gastroesophageal reflux disease)    takes Omeprazole daily  . H/O hiatal hernia   . Hyperlipidemia    takes Zocor daily  . Hypertension    takes Amlodipine daily  . Neck pain  C4-7 stenosis and herniated disc  . Neuromuscular disorder ([place])    hiatal hernia  . Scoliosis    slight  . Spinal cord injury, C5-C7 ([place])    c4-c7  . Stiffness of hand joint    d/t cervical issues    SURGICAL HISTORY: Past Surgical History:  Procedure Laterality Date  . ANTERIOR CERVICAL DECOMP/DISCECTOMY FUSION  08/18/2011   Procedure: ANTERIOR CERVICAL DECOMPRESSION/DISCECTOMY FUSION 3 LEVELS;  Surgeon: Winfield Cunas, MD;  Location: [place] NEURO ORS;  Service: Neurosurgery;  Laterality: N/A;  Anterior Cervical Four-Five/Five-Six/Six-Seven  Decompression with Fusion, Plating, and Bonegraft  . CARPAL TUNNEL RELEASE  2013   bilateral, per Dr. Christella Noa   . COLONOSCOPY  10-30-14   per Dr. Olevia Perches, clear, repeat in 10 yrs   . egd with esophageal dilation  9-08   per Dr. Olevia Perches  . ERCP N/A 03/01/2016   Procedure: ENDOSCOPIC RETROGRADE CHOLANGIOPANCREATOGRAPHY (ERCP) with brushings and stent;  Surgeon: Doran Stabler, MD;  Location: [facility] ENDOSCOPY;  Service: Endoscopy;  Laterality: N/A;  . EUS N/A 03/09/2016   Procedure: ESOPHAGEAL ENDOSCOPIC ULTRASOUND (EUS) RADIAL;  Surgeon: Milus Banister, MD;  Location: [facility] ENDOSCOPY;  Service: Endoscopy;  Laterality: N/A;  . lymph nodes biopsy    . melanoma rt calf  1999  . PORTACATH PLACEMENT Left 03/22/2016   Procedure: INSERTION PORT-A-CATH;  Surgeon: Stark Klein, MD;  Location: [facility] ORS;  Service: General;  Laterality: Left;  . SPINE SURGERY    . TONSILLECTOMY     as a child  . ULNAR TUNNEL RELEASE  2013   right arm, per Dr. Christella Noa   . UPPER GASTROINTESTINAL ENDOSCOPY      SOCIAL HISTORY: Social History   Social History  . Marital status: Married    Spouse name: [name]  . Number of children: N/A  . Years of education: N/A   Occupational History  . Driver    Social History Main Topics  . Smoking status: Never Smoker  . Smokeless tobacco: Never Used     Comment: tried a pipe 35 years ago   . Alcohol use No  . Drug use: No  . Sexual activity: Yes   Other Topics Concern  . Not on file   Social History Narrative   Married, wife Rosaria Ferries Nutritional therapist-   He works as a Geophysicist/field seismologist for a company  He has one son 93 yo. Lives with his wife   FAMILY HISTORY: Family History  Problem Relation Age of Onset  . Heart disease Father   . Heart disease Brother 60  . Anesthesia problems Mother   . Heart disease Mother   . Dementia Mother   . Diabetes Sister   . Stroke Sister   . Colon cancer Neg Hx   . Rectal cancer Neg Hx   . Stomach cancer Neg Hx     ALLERGIES:  has No Known  Allergies.  MEDICATIONS:  Current Outpatient Prescriptions  Medication Sig Dispense Refill  . amLODipine (NORVASC) 10 MG tablet Take 1 tablet (10 mg total) by mouth daily. 30 tablet 1  . benazepril (LOTENSIN) 10 MG tablet Take 1 tablet (10 mg total) by mouth daily. 30 tablet 1  . docusate sodium (COLACE) 100 MG capsule Take 1 capsule (100 mg total) by mouth 2 (two) times daily. 10 capsule 0  . insulin aspart (NOVOLOG) 100 UNIT/ML injection Give every morning with breakfast and every evening with supper per the following sliding scale: give 3 units for glucose  150-175, give 6 units for glucose 176-200, give 9 units for glucose 201-250, give 12 units for glucose greater than 250 3 vial 11  . Insulin Detemir (LEVEMIR) 100 UNIT/ML Pen Inject 20 Units into the skin 2 (two) times daily. Inject 20 units into the skin each evening. 30 mL 3  . insulin glargine (LANTUS) 100 UNIT/ML injection Inject 0.2 mLs (20 Units total) into the skin 2 (two) times daily. 10 mL 11  . Insulin Pen Needle 30G X 5 MM MISC Use one daily with insulin 100 each 2  . JARDIANCE 25 MG TABS tablet TAKE 1 TABLET BY MOUTH EVERY DAY 90 tablet 1  . Lancets (FREESTYLE) lancets USE AS INSTRUCTED TO CHECK BLOOD SUGAR ONCE A DAY 100 each 1  . lidocaine-prilocaine (EMLA) cream Apply to affected area once (Patient taking differently: Apply 1 application topically as needed (for port). ) 30 g 3  . Multiple Vitamin (MULTIVITAMIN WITH MINERALS) TABS tablet Take 1 tablet by mouth daily.    Marland Kitchen omega-3 acid ethyl esters (LOVAZA) 1 g capsule Take 2 g by mouth daily.    Marland Kitchen omeprazole (PRILOSEC) 20 MG capsule Take 20 mg by mouth daily.    . ondansetron (ZOFRAN) 8 MG tablet Take 1 tablet (8 mg total) by mouth 2 (two) times daily as needed for refractory nausea / vomiting. Start on day 3 after chemotherapy. 30 tablet 1  . oxyCODONE (OXY IR/ROXICODONE) 5 MG immediate release tablet Take 1-2 tablets (5-10 mg total) by mouth every 4 (four) hours as needed for  moderate pain. 30 tablet 0  . polyethylene glycol (MIRALAX / GLYCOLAX) packet Take 17 g by mouth daily as needed for mild constipation. 14 each 0  . pravastatin (PRAVACHOL) 40 MG tablet TAKE 1 TABLET (40 MG TOTAL) BY MOUTH DAILY. 90 tablet 1  . prochlorperazine (COMPAZINE) 10 MG tablet Take 1 tablet (10 mg total) by mouth every 6 (six) hours as needed (NAUSEA). 30 tablet 1   No current facility-administered medications for this visit.     REVIEW OF SYSTEMS:   Constitutional: Denies fevers, chills or abnormal night sweats Eyes: Denies blurriness of vision, double vision or watery eyes Ears, nose, mouth, throat, and face: Denies mucositis or sore throat Respiratory: Denies cough, dyspnea or wheezes Cardiovascular: Denies palpitation, chest discomfort or lower extremity swelling Gastrointestinal:  Denies nausea, heartburn or change in bowel habits Skin: Denies abnormal skin rashes Lymphatics: Denies new lymphadenopathy or easy bruising Neurological:Denies numbness, tingling or new weaknesses Behavioral/Psych: Mood is stable, no new changes  All other systems were reviewed with the patient and are negative.  PHYSICAL EXAMINATION: ECOG PERFORMANCE STATUS: 1  Vitals:   04/11/16 1101  BP: 129/74  Pulse: 79  Resp: 18  Temp: 97.6 F (36.4 C)   Filed Weights   04/11/16 1101  Weight: 186 lb 12.8 oz (84.7 kg)    GENERAL:alert, no distress and comfortable SKIN: skin color, texture, turgor are normal, no rashes or significant lesions EYES: normal, conjunctiva are pink and non-injected, sclera clear OROPHARYNX:no exudate, no erythema and lips, buccal mucosa, and tongue normal  NECK: supple, thyroid normal size, non-tender, without nodularity LYMPH:  no palpable lymphadenopathy in the cervical, axillary or inguinal LUNGS: clear to auscultation and percussion with normal breathing effort HEART: regular rate & rhythm and no murmurs and no lower extremity edema ABDOMEN:abdomen soft,  non-tender and normal bowel sounds Musculoskeletal:no cyanosis of digits and no clubbing  PSYCH: alert & oriented x 3 with fluent speech NEURO: no  focal motor/sensory deficits  LABORATORY DATA:  I have reviewed the data as listed CBC Latest Ref Rng & Units 04/11/2016 04/04/2016 03/28/2016  WBC 4.0 - 10.3 10e3/uL 18.6(H) 12.5(H) 13.5(H)  Hemoglobin 13.0 - 17.1 g/dL 11.0(L) 11.9(L) 12.6(L)  Hematocrit 38.4 - 49.9 % 33.4(L) 35.8(L) 37.6(L)  Platelets 140 - 400 10e3/uL 316 441(H) 373   CMP Latest Ref Rng & Units 04/11/2016 04/04/2016 03/28/2016  Glucose 70 - 140 mg/dl 128 549(H) 385(H)  BUN 7.0 - 26.0 mg/dL 13.8 15.5 16.2  Creatinine 0.7 - 1.3 mg/dL 0.6(L) 0.8 0.8  Sodium 136 - 145 mEq/L 141 135(L) 138  Potassium 3.5 - 5.1 mEq/L 3.6 4.6 4.5  Chloride 101 - 111 mmol/L - - -  CO2 22 - 29 mEq/L 27 21(L) 20(L)  Calcium 8.4 - 10.4 mg/dL 9.3 9.3 10.4  Total Protein 6.4 - 8.3 g/dL 7.0 7.0 7.9  Total Bilirubin 0.20 - 1.20 mg/dL 0.45 0.57 1.03  Alkaline Phos 40 - 150 U/L 234(H) 269(H) 317(H)  AST 5 - 34 U/L 36(H) 19 19  ALT 0 - 55 U/L 47 27 43   PATHOLOGY REPORT  Diagnosis 03/09/2016 FINE NEEDLE ASPIRATION, ENDOSCOPIC, PANCREAS (SPECIMEN 1 OF 1 COLLECTED 03/09/16): MALIGNANT CELLS CONSISTENT WITH ADENOCARCINOMA. BACKGROUND NECROTIC DEBRIS AND ACUTE INFLAMMATION.  Diagnosis 03/01/2016 BILE DUCT BRUSHING(SPECIMEN 1 OF 1 COLLECTED 03/01/16): RARE ATYPICAL CELLS, SEE COMMENT.  RADIOGRAPHIC STUDIES: I have personally reviewed the radiological images as listed and agreed with the findings in the report. Dg Chest Port 1 View  Result Date: 03/22/2016 CLINICAL DATA:  Pancreatic carcinoma and status post Port-A-Cath placement. EXAM: PORTABLE CHEST 1 VIEW COMPARISON:  08/16/2011 FINDINGS: Left-sided subclavian venous Port-A-Cath has been placed with the catheter tip in the SVC. No pneumothorax identified. Mild scarring is present at both lung bases. There is no evidence of pulmonary edema, consolidation, nodule  or pleural fluid. The heart size and mediastinal contours are within normal limits. The visualized skeletal structures are unremarkable. IMPRESSION: Status post Port-A-Cath placement with catheter tip in the SVC. No acute findings after placement. Electronically Signed   By: Aletta Edouard M.D.   On: 03/22/2016 12:42   Dg C-arm 1-60 Min-no Report  Result Date: 04/10/2016 Fluoroscopy was utilized by the requesting physician.  No radiographic interpretation.   EUS 03/09/2016 Dr. Ardis Hughs  - 3.5cm irregularly shaped, poorly defined mass in the pancreatic head. There is suspicious, nearby adenopathy. The mass is causing pancreatic and bililary duct obstruction (previously stented). The mass involves the portal vein for 44mm (abuttment and loss of usual tissue interface), strongly suggesting invasion. Preliminary cytology results are positive for malignancy (adenocarcinoma), await final report.  ASSESSMENT & PLAN: 60 year old Caucasian male, with past medical history of diabetes and hypertension, presented with epigastric pain, weight loss, and jaundice.  1. Primary pancreatic adenocarcinoma, in pancreas head, cT2N1M0, stage IIB, borderline resectable  -I have reviewed his CT abdomen and pelvis with contrast, EUS and biopsy findings with patient and his wife in great details. -His case was reviewed in our GI tumor board a few days ago. CT scan and US showed possible portal vein invasion from the pancreatic tumor, this is borderline resectable disease. No evidence of distant metastasis.  -We reviewed the nature history of pancreatic cancer, and the overall survival rate with chemotherapy, surgery, and radiation. Patient and his wife was discouraged by the overall dismal long term survival rate (~20%)  -He was seen by surgeon Dr. Barry Dienes today, Whipple surgery was discussed and offered to patient.  -I  reviewed the high risk of cancer recurrence after surgery, and the role of neoadjuvant and adjuvant  chemotherapy to reduce the risk of recurrence, and shrink the tumor before surgery. -I discussed with Dr. Barry Dienes, we recommend patient to have new adjuvant chemotherapy first, and a possible SBRT after chemo, based on his next staging scan findings after chemo.  -He has started neoadjuvant chemotherapy with FOLFIRINOX, tolerated first cycle well overall. -Lab results reviewed with him, his blood glucose has been much better, 128 today. He has leukocytosis, likely secondary to his Neulasta. We'll proceed with cycle 2 chemotherapy today  2. Poorly controlled DM - he will continue medication and follow up with his primary care physician Dr. Sarajane Jews  -We reviewed that his blood glucose will need to be monitored closely during the chemotherapy, and his medication may need to be adjusted  -His blood glucose has been significantly elevated, fasting in 150-mid 200s, postprandial in 500 range, his insulin has been adjusted, is also watching his diet -His blood glucose overall has much improved. We again discussed diabetic diet.   3. HTN -He is on amlodipine and benazepril  -His blood pressure is normal today, but slightly lower than last week. -The discussed chemotherapy may affect his blood pressure, and he is medication may need to be adjusted   Plan -lab reviewed, will proceed cycle 2 FOLFIRINOX today, decreased premedication dexamethasone to 4mg , due to his diabetes, Neulasta on day 3 -I'll see him back in 2 weeks before chemotherapy -I wrote a letter for him to return to work on 04/25/2016 with light-duty  All questions were answered. The patient knows to call the clinic with any problems, questions or concerns.  I spent 20 minutes counseling the patient face to face. The total time spent in the appointment was 25 minutes and more than 50% was on counseling.     Truitt Merle, MD 04/11/2016

## 2016-04-11 NOTE — Patient Instructions (Signed)
[facility] Discharge Instructions for Patients Receiving Chemotherapy  Today you received the following chemotherapy agents:  Oxaliplatin, Irinotecan, Leucovorin, and 5FU.  To help prevent nausea and vomiting after your treatment, we encourage you to take your nausea medication as directed.   If you develop nausea and vomiting that is not controlled by your nausea medication, call the clinic.   BELOW ARE SYMPTOMS THAT SHOULD BE REPORTED IMMEDIATELY:  *FEVER GREATER THAN 100.5 F  *CHILLS WITH OR WITHOUT FEVER  NAUSEA AND VOMITING THAT IS NOT CONTROLLED WITH YOUR NAUSEA MEDICATION  *UNUSUAL SHORTNESS OF BREATH  *UNUSUAL BRUISING OR BLEEDING  TENDERNESS IN MOUTH AND THROAT WITH OR WITHOUT PRESENCE OF ULCERS  *URINARY PROBLEMS  *BOWEL PROBLEMS  UNUSUAL RASH Items with * indicate a potential emergency and should be followed up as soon as possible.  Feel free to call the clinic you have any questions or concerns. The clinic phone number is [phone].  Please show the CHEMO ALERT CARD at check-in to the Emergency Department and triage nurse.   

## 2016-04-13 ENCOUNTER — Ambulatory Visit (HOSPITAL_BASED_OUTPATIENT_CLINIC_OR_DEPARTMENT_OTHER): Payer: 59

## 2016-04-13 ENCOUNTER — Ambulatory Visit: Payer: 59

## 2016-04-13 VITALS — BP 126/69 | HR 79 | Temp 98.3°F | Resp 18

## 2016-04-13 DIAGNOSIS — Z5189 Encounter for other specified aftercare: Secondary | ICD-10-CM

## 2016-04-13 DIAGNOSIS — C250 Malignant neoplasm of head of pancreas: Secondary | ICD-10-CM | POA: Diagnosis not present

## 2016-04-13 MED ORDER — SODIUM CHLORIDE 0.9% FLUSH
10.0000 mL | INTRAVENOUS | Status: DC | PRN
  Administered 2016-04-13: 10 mL
  Filled 2016-04-13: qty 10

## 2016-04-13 MED ORDER — HEPARIN SOD (PORK) LOCK FLUSH 100 UNIT/ML IV SOLN
500.0000 [IU] | Freq: Once | INTRAVENOUS | Status: AC | PRN
  Administered 2016-04-13: 500 [IU]
  Filled 2016-04-13: qty 5

## 2016-04-13 MED ORDER — PEGFILGRASTIM INJECTION 6 MG/0.6ML ~~LOC~~
6.0000 mg | PREFILLED_SYRINGE | Freq: Once | SUBCUTANEOUS | Status: AC
  Administered 2016-04-13: 6 mg via SUBCUTANEOUS
  Filled 2016-04-13: qty 0.6

## 2016-04-25 ENCOUNTER — Other Ambulatory Visit (HOSPITAL_BASED_OUTPATIENT_CLINIC_OR_DEPARTMENT_OTHER): Payer: 59

## 2016-04-25 ENCOUNTER — Encounter: Payer: Self-pay | Admitting: Hematology

## 2016-04-25 ENCOUNTER — Ambulatory Visit (HOSPITAL_BASED_OUTPATIENT_CLINIC_OR_DEPARTMENT_OTHER): Payer: 59 | Admitting: Hematology

## 2016-04-25 ENCOUNTER — Ambulatory Visit: Payer: 59

## 2016-04-25 ENCOUNTER — Ambulatory Visit (HOSPITAL_BASED_OUTPATIENT_CLINIC_OR_DEPARTMENT_OTHER): Payer: 59

## 2016-04-25 VITALS — BP 124/70 | HR 79 | Temp 98.0°F | Resp 18 | Ht 70.0 in | Wt 185.2 lb

## 2016-04-25 DIAGNOSIS — Z5111 Encounter for antineoplastic chemotherapy: Secondary | ICD-10-CM

## 2016-04-25 DIAGNOSIS — C250 Malignant neoplasm of head of pancreas: Secondary | ICD-10-CM

## 2016-04-25 DIAGNOSIS — E1165 Type 2 diabetes mellitus with hyperglycemia: Secondary | ICD-10-CM | POA: Diagnosis not present

## 2016-04-25 DIAGNOSIS — I10 Essential (primary) hypertension: Secondary | ICD-10-CM

## 2016-04-25 DIAGNOSIS — Z794 Long term (current) use of insulin: Secondary | ICD-10-CM | POA: Diagnosis not present

## 2016-04-25 DIAGNOSIS — Z95828 Presence of other vascular implants and grafts: Secondary | ICD-10-CM

## 2016-04-25 DIAGNOSIS — IMO0001 Reserved for inherently not codable concepts without codable children: Secondary | ICD-10-CM

## 2016-04-25 LAB — CBC WITH DIFFERENTIAL/PLATELET
BASO%: 1.1 % (ref 0.0–2.0)
Basophils Absolute: 0.2 10*3/uL — ABNORMAL HIGH (ref 0.0–0.1)
EOS%: 2 % (ref 0.0–7.0)
Eosinophils Absolute: 0.3 10*3/uL (ref 0.0–0.5)
HCT: 34.8 % — ABNORMAL LOW (ref 38.4–49.9)
HGB: 11.7 g/dL — ABNORMAL LOW (ref 13.0–17.1)
LYMPH%: 19.7 % (ref 14.0–49.0)
MCH: 31 pg (ref 27.2–33.4)
MCHC: 33.6 g/dL (ref 32.0–36.0)
MCV: 92.1 fL (ref 79.3–98.0)
MONO#: 1.1 10*3/uL — ABNORMAL HIGH (ref 0.1–0.9)
MONO%: 8 % (ref 0.0–14.0)
NEUT#: 9.6 10*3/uL — ABNORMAL HIGH (ref 1.5–6.5)
NEUT%: 69.2 % (ref 39.0–75.0)
Platelets: 391 10*3/uL (ref 140–400)
RBC: 3.78 10*6/uL — ABNORMAL LOW (ref 4.20–5.82)
RDW: 14.4 % (ref 11.0–14.6)
WBC: 13.9 10*3/uL — ABNORMAL HIGH (ref 4.0–10.3)
lymph#: 2.7 10*3/uL (ref 0.9–3.3)
nRBC: 0 % (ref 0–0)

## 2016-04-25 LAB — COMPREHENSIVE METABOLIC PANEL
ALT: 32 U/L (ref 0–55)
AST: 20 U/L (ref 5–34)
Albumin: 3.2 g/dL — ABNORMAL LOW (ref 3.5–5.0)
Alkaline Phosphatase: 201 U/L — ABNORMAL HIGH (ref 40–150)
Anion Gap: 10 mEq/L (ref 3–11)
BUN: 12.8 mg/dL (ref 7.0–26.0)
CO2: 25 mEq/L (ref 22–29)
Calcium: 9.7 mg/dL (ref 8.4–10.4)
Chloride: 105 mEq/L (ref 98–109)
Creatinine: 0.6 mg/dL — ABNORMAL LOW (ref 0.7–1.3)
EGFR: >90 mL/min/{1.73_m2} (ref >90)
Glucose: 144 mg/dl — ABNORMAL HIGH (ref 70–140)
Potassium: 3.8 mEq/L (ref 3.5–5.1)
Sodium: 141 mEq/L (ref 136–145)
Total Bilirubin: 0.3 mg/dL (ref 0.20–1.20)
Total Protein: 7.7 g/dL (ref 6.4–8.3)

## 2016-04-25 MED ORDER — SODIUM CHLORIDE 0.9 % IV SOLN
2400.0000 mg/m2 | INTRAVENOUS | Status: DC
  Administered 2016-04-25: 4850 mg via INTRAVENOUS
  Filled 2016-04-25: qty 97

## 2016-04-25 MED ORDER — SODIUM CHLORIDE 0.9 % IV SOLN
4.0000 mg | Freq: Once | INTRAVENOUS | Status: AC
  Administered 2016-04-25: 4 mg via INTRAVENOUS
  Filled 2016-04-25: qty 0.4

## 2016-04-25 MED ORDER — SODIUM CHLORIDE 0.9 % IV SOLN
400.0000 mg/m2 | Freq: Once | INTRAVENOUS | Status: AC
  Administered 2016-04-25: 812 mg via INTRAVENOUS
  Filled 2016-04-25: qty 40.6

## 2016-04-25 MED ORDER — SODIUM CHLORIDE 0.9 % IJ SOLN
10.0000 mL | INTRAMUSCULAR | Status: DC | PRN
  Administered 2016-04-25: 10 mL via INTRAVENOUS
  Filled 2016-04-25: qty 10

## 2016-04-25 MED ORDER — DEXTROSE 5 % IV SOLN: 400.0000 mg/m2 | Freq: Once | INTRAVENOUS | Status: DC

## 2016-04-25 MED ORDER — DEXTROSE 5 % IV SOLN
Freq: Once | INTRAVENOUS | Status: AC
  Administered 2016-04-25: 11:00:00 via INTRAVENOUS

## 2016-04-25 MED ORDER — IRINOTECAN HCL CHEMO INJECTION 100 MG/5ML
180.0000 mg/m2 | Freq: Once | INTRAVENOUS | Status: AC
  Administered 2016-04-25: 360 mg via INTRAVENOUS
  Filled 2016-04-25: qty 10

## 2016-04-25 MED ORDER — DEXTROSE 5 % IV SOLN
85.0000 mg/m2 | Freq: Once | INTRAVENOUS | Status: AC
  Administered 2016-04-25: 175 mg via INTRAVENOUS
  Filled 2016-04-25: qty 35

## 2016-04-25 MED ORDER — FLUOROURACIL CHEMO INJECTION 2.5 GM/50ML
400.0000 mg/m2 | Freq: Once | INTRAVENOUS | Status: AC
  Administered 2016-04-25: 800 mg via INTRAVENOUS
  Filled 2016-04-25: qty 16

## 2016-04-25 MED ORDER — ATROPINE SULFATE 1 MG/ML IJ SOLN
0.5000 mg | Freq: Once | INTRAMUSCULAR | Status: AC | PRN
  Administered 2016-04-25: 0.5 mg via INTRAVENOUS

## 2016-04-25 MED ORDER — PALONOSETRON HCL INJECTION 0.25 MG/5ML
INTRAVENOUS | Status: AC
  Filled 2016-04-25: qty 5

## 2016-04-25 MED ORDER — ATROPINE SULFATE 1 MG/ML IJ SOLN
INTRAMUSCULAR | Status: AC
  Filled 2016-04-25: qty 1

## 2016-04-25 MED ORDER — PALONOSETRON HCL INJECTION 0.25 MG/5ML
0.2500 mg | Freq: Once | INTRAVENOUS | Status: AC
  Administered 2016-04-25: 0.25 mg via INTRAVENOUS

## 2016-04-25 NOTE — Patient Instructions (Signed)
[facility] Discharge Instructions for Patients Receiving Chemotherapy  Today you received the following chemotherapy agents:  Oxaliplatin, Leucovorin, Irinotecan, Fluorouracil  To help prevent nausea and vomiting after your treatment, we encourage you to take your nausea medication as prescribed.   If you develop nausea and vomiting that is not controlled by your nausea medication, call the clinic.   BELOW ARE SYMPTOMS THAT SHOULD BE REPORTED IMMEDIATELY:  *FEVER GREATER THAN 100.5 F  *CHILLS WITH OR WITHOUT FEVER  NAUSEA AND VOMITING THAT IS NOT CONTROLLED WITH YOUR NAUSEA MEDICATION  *UNUSUAL SHORTNESS OF BREATH  *UNUSUAL BRUISING OR BLEEDING  TENDERNESS IN MOUTH AND THROAT WITH OR WITHOUT PRESENCE OF ULCERS  *URINARY PROBLEMS  *BOWEL PROBLEMS  UNUSUAL RASH Items with * indicate a potential emergency and should be followed up as soon as possible.  Feel free to call the clinic you have any questions or concerns. The clinic phone number is [phone].  Please show the CHEMO ALERT CARD at check-in to the Emergency Department and triage nurse.   

## 2016-04-25 NOTE — Patient Instructions (Signed)

## 2016-04-25 NOTE — Progress Notes (Signed)
[name]  Telephone:([phone]) [phone] Fax:(336) [phone]  Clinic Follow up Note   Patient Care Team: [name], MD as PCP - General 04/25/2016  CHIEF COMPLAINTS:  Follow up pancreatic cancer  Oncology History   Adenocarcinoma of head of pancreas [facility])   Staging form: Pancreas, AJCC 7th Edition   - Clinical stage from 03/09/2016: Stage IIB (T2, N1, M0) - Signed by Truitt Merle, MD on 03/17/2016      Adenocarcinoma of head of pancreas ([place])   02/27/2016 Imaging    CT chest, abdomen and pelvis with contrast showed ill-defined heterogeneity of pancreatic head, highly suspicious for malignancy, ) quit about and biliary ductal dilatation, mildly prominent lymph nodes in the upper abdomen, largest 2 cm in the portocaval . No other metastasis.       03/09/2016 Initial Diagnosis    Adenocarcinoma of head of pancreas ([place])      03/09/2016 Procedure    Upper EUS showed a 3.5 cm irregular mass in the pancreatic head, causing pancreatic and biliary duct obstruction. The mass involves the portal vein 422 mm, strongly suggesting invasion. There is suspicious nearby adenopathy      03/09/2016 Initial Biopsy    Fine-needle aspiration of the pancreatic mass from EUS showed malignant cells consistent with adenocarcinoma.       03/28/2016 -  Chemotherapy    neoadjuvant chemo with FOLFIRINOX every 2 weeks       HISTORY OF PRESENTING ILLNESS:  Colton Bush 60 y.o. male is here because of His newly diagnosed pancreatic cancer. He is accompanied by his wife to our multidisciplinary GI clinic today.  He noticed fatigue, anorexia and weight loss about 50bs in the past 4 months. It started when his endocrinologist changed his diabetic meds. He presented to hospital on 7/30 with jaundice. No pain or nausea. CT scan revealed a heterogeneous pancreatic head mass, highly suspicious for malignancy. Mild biliary and pancreatic duct dilatation, mildly prominent lymph nodes in the upper  abdomen. He underwent ERCP and CBD stent placement, and subsequent upper EUS on 03/09/2016, which showed a 3.5 cm irregular mass in the pancreatic head, the mass involves the portal vein, no suspicious nearby adenopathy.  He has been feeling better after stent placement, with improved appetite and energy level. He feels normal again. No symptoms. BM is good, he has gained about 8lbs back.   CURRENT THERAPY: neoadjuvant chemo with FOLFIRINOX every 2 weeks, started on 03/28/2016  INTERIM HISTORY: MR. Colton Bush returns for follow up and third cycle chemo. He is doing well overall. His blood glucose has been better controlled lately, his fasting glucose is in the range of 100-150 lately. His more compliant with diabetic diet lately. He tolerated last cycle chemotherapy well, no significant nausea, anorexia, diarrhea, or neuropathy. He has a mild fatigue, able to function well at home. He is thinking about going back to work with light duty job next week. He has gained some weight back daily. No other new complaints.  MEDICAL HISTORY:  Past Medical History:  Diagnosis Date  . Arthritis    left hand  . Bronchitis 1977  . Cancer [facility])    pancreatic cancer  . Depression    takes Cymbalta daily  . Diabetes mellitus type II    sees Dr. Dwyane Bush   . GERD (gastroesophageal reflux disease)    takes Omeprazole daily  . H/O hiatal hernia   . Hyperlipidemia    takes Zocor daily  . Hypertension    takes  Amlodipine daily  . Neck pain    C4-7 stenosis and herniated disc  . Neuromuscular disorder ([place])    hiatal hernia  . Scoliosis    slight  . Spinal cord injury, C5-C7 ([place])    c4-c7  . Stiffness of hand joint    d/t cervical issues    SURGICAL HISTORY: Past Surgical History:  Procedure Laterality Date  . ANTERIOR CERVICAL DECOMP/DISCECTOMY FUSION  08/18/2011   Procedure: ANTERIOR CERVICAL DECOMPRESSION/DISCECTOMY FUSION 3 LEVELS;  Surgeon: Winfield Cunas, MD;  Location: [place] [facility]URO ORS;  Service:  Neurosurgery;  Laterality: N/A;  Anterior Cervical Four-Five/Five-Six/Six-Seven Decompression with Fusion, Plating, and Bonegraft  . CARPAL TUNNEL RELEASE  2013   bilateral, per Dr. Christella Noa   . COLONOSCOPY  10-30-14   per Dr. Olevia Perches, clear, repeat in 10 yrs   . egd with esophageal dilation  9-08   per Dr. Olevia Perches  . ERCP N/A 03/01/2016   Procedure: ENDOSCOPIC RETROGRADE CHOLANGIOPANCREATOGRAPHY (ERCP) with brushings and stent;  Surgeon: Doran Stabler, MD;  Location: [facility] ENDOSCOPY;  Service: Endoscopy;  Laterality: N/A;  . EUS N/A 03/09/2016   Procedure: ESOPHAGEAL ENDOSCOPIC ULTRASOUND (EUS) RADIAL;  Surgeon: Milus Banister, MD;  Location: [facility] ENDOSCOPY;  Service: Endoscopy;  Laterality: N/A;  . lymph nodes biopsy    . melanoma rt calf  1999  . PORTACATH PLACEMENT Left 03/22/2016   Procedure: INSERTION PORT-A-CATH;  Surgeon: Stark Klein, MD;  Location: [facility] ORS;  Service: General;  Laterality: Left;  . SPINE SURGERY    . TONSILLECTOMY     as a child  . ULNAR TUNNEL RELEASE  2013   right arm, per Dr. Christella Noa   . UPPER GASTROINTESTINAL ENDOSCOPY      SOCIAL HISTORY: Social History   Social History  . Marital status: Married    Spouse name: Colton Bush  . Number of children: N/A  . Years of education: N/A   Occupational History  . Driver    Social History Main Topics  . Smoking status: Never Smoker  . Smokeless tobacco: Never Used     Comment: tried a pipe 35 years ago   . Alcohol use No  . Drug use: No  . Sexual activity: Yes   Other Topics Concern  . 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 Lives with his wife   FAMILY HISTORY: Family History  Problem Relation Age of Onset  . Heart disease Father   . Heart disease Brother 59  . Anesthesia problems Mother   . Heart disease Mother   . Dementia Mother   . Diabetes Sister   . Stroke Sister   . Colon cancer Neg Hx   .  Rectal cancer Neg Hx   . Stomach cancer Neg Hx     ALLERGIES:  has No Known Allergies.  MEDICATIONS:  Current Outpatient Prescriptions  Medication Sig Dispense Refill  . amLODipine (NORVASC) 10 MG tablet Take 1 tablet (10 mg total) by mouth daily. 30 tablet 1  . benazepril (LOTENSIN) 10 MG tablet Take 1 tablet (10 mg total) by mouth daily. 30 tablet 1  . docusate sodium (COLACE) 100 MG capsule Take 1 capsule (100 mg total) by mouth 2 (two) times daily. 10 capsule 0  . insulin aspart (NOVOLOG) 100 UNIT/ML injection Give every morning with breakfast and every evening with supper per  the following sliding scale: give 3 units for glucose 150-175, give 6 units for glucose 176-200, give 9 units for glucose 201-250, give 12 units for glucose greater than 250 3 vial 11  . Insulin Detemir (LEVEMIR) 100 UNIT/ML Pen Inject 20 Units into the skin 2 (two) times daily. Inject 20 units into the skin each evening. 30 mL 3  . insulin glargine (LANTUS) 100 UNIT/ML injection Inject 0.2 mLs (20 Units total) into the skin 2 (two) times daily. 10 mL 11  . Insulin Pen Needle 30G X 5 MM MISC Use one daily with insulin 100 each 2  . JARDIANCE 25 MG TABS tablet TAKE 1 TABLET BY MOUTH EVERY DAY 90 tablet 1  . Lancets (FREESTYLE) lancets USE AS INSTRUCTED TO CHECK BLOOD SUGAR ONCE A DAY 100 each 1  . lidocaine-prilocaine (EMLA) cream Apply to affected area once (Patient taking differently: Apply 1 application topically as needed (for port). ) 30 g 3  . Multiple Vitamin (MULTIVITAMIN WITH MINERALS) TABS tablet Take 1 tablet by mouth daily.    Marland Kitchen omega-3 acid ethyl esters (LOVAZA) 1 g capsule Take 2 g by mouth daily.    Marland Kitchen omeprazole (PRILOSEC) 20 MG capsule Take 20 mg by mouth daily.    . ondansetron (ZOFRAN) 8 MG tablet Take 1 tablet (8 mg total) by mouth 2 (two) times daily as needed for refractory nausea / vomiting. Start on day 3 after chemotherapy. 30 tablet 1  . oxyCODONE (OXY IR/ROXICODONE) 5 MG immediate release  tablet Take 1-2 tablets (5-10 mg total) by mouth every 4 (four) hours as needed for moderate pain. 30 tablet 0  . polyethylene glycol (MIRALAX / GLYCOLAX) packet Take 17 g by mouth daily as needed for mild constipation. 14 each 0  . pravastatin (PRAVACHOL) 40 MG tablet TAKE 1 TABLET (40 MG TOTAL) BY MOUTH DAILY. 90 tablet 1  . prochlorperazine (COMPAZINE) 10 MG tablet Take 1 tablet (10 mg total) by mouth every 6 (six) hours as needed (NAUSEA). 30 tablet 1   No current facility-administered medications for this visit.     REVIEW OF SYSTEMS:   Constitutional: Denies fevers, chills or abnormal night sweats Eyes: Denies blurriness of vision, double vision or watery eyes Ears, nose, mouth, throat, and face: Denies mucositis or sore throat Respiratory: Denies cough, dyspnea or wheezes Cardiovascular: Denies palpitation, chest discomfort or lower extremity swelling Gastrointestinal:  Denies nausea, heartburn or change in bowel habits Skin: Denies abnormal skin rashes Lymphatics: Denies new lymphadenopathy or easy bruising Neurological:Denies numbness, tingling or new weaknesses Behavioral/Psych: Mood is stable, no new changes  All other systems were reviewed with the patient and are negative.  PHYSICAL EXAMINATION: ECOG PERFORMANCE STATUS: 1  Vitals:   04/25/16 0916  BP: 124/70  Pulse: 79  Resp: 18  Temp: 98 F (36.7 C)   Filed Weights   04/25/16 0916  Weight: 185 lb 3.2 oz (84 kg)    GENERAL:alert, no distress and comfortable SKIN: skin color, texture, turgor are normal, no rashes or significant lesions EYES: normal, conjunctiva are pink and non-injected, sclera clear OROPHARYNX:no exudate, no erythema and lips, buccal mucosa, and tongue normal  NECK: supple, thyroid normal size, non-tender, without nodularity LYMPH:  no palpable lymphadenopathy in the cervical, axillary or inguinal LUNGS: clear to auscultation and percussion with normal breathing effort HEART: regular rate &  rhythm and no murmurs and no lower extremity edema ABDOMEN:abdomen soft, non-tender and normal bowel sounds Musculoskeletal:no cyanosis of digits and no clubbing  PSYCH: alert &  oriented x 3 with fluent speech NEURO: no focal motor/sensory deficits  LABORATORY DATA:  I have reviewed the data as listed CBC Latest Ref Rng & Units 04/25/2016 04/11/2016 04/04/2016  WBC 4.0 - 10.3 10e3/uL 13.9(H) 18.6(H) 12.5(H)  Hemoglobin 13.0 - 17.1 g/dL 11.7(L) 11.0(L) 11.9(L)  Hematocrit 38.4 - 49.9 % 34.8(L) 33.4(L) 35.8(L)  Platelets 140 - 400 10e3/uL 391 316 441(H)   CMP Latest Ref Rng & Units 04/25/2016 04/11/2016 04/04/2016  Glucose 70 - 140 mg/dl 144(H) 128 549(H)  BUN 7.0 - 26.0 mg/dL 12.8 13.8 15.5  Creatinine 0.7 - 1.3 mg/dL 0.6(L) 0.6(L) 0.8  Sodium 136 - 145 mEq/L 141 141 135(L)  Potassium 3.5 - 5.1 mEq/L 3.8 3.6 4.6  Chloride 101 - 111 mmol/L - - -  CO2 22 - 29 mEq/L 25 27 21(L)  Calcium 8.4 - 10.4 mg/dL 9.7 9.3 9.3  Total Protein 6.4 - 8.3 g/dL 7.7 7.0 7.0  Total Bilirubin 0.20 - 1.20 mg/dL 0.30 0.45 0.57  Alkaline Phos 40 - 150 U/L 201(H) 234(H) 269(H)  AST 5 - 34 U/L 20 36(H) 19  ALT 0 - 55 U/L 32 47 27   CA19.9 (0-35 U/ML) 02/28/2016: 1497 03/24/2016: 648   PATHOLOGY REPORT  Diagnosis 03/09/2016 FINE NEEDLE ASPIRATION, ENDOSCOPIC, PANCREAS (SPECIMEN 1 OF 1 COLLECTED 03/09/16): MALIGNANT CELLS CONSISTENT WITH ADENOCARCINOMA. BACKGROUND NECROTIC DEBRIS AND ACUTE INFLAMMATION.  Diagnosis 03/01/2016 BILE DUCT BRUSHING(SPECIMEN 1 OF 1 COLLECTED 03/01/16): RARE ATYPICAL CELLS, SEE COMMENT.  RADIOGRAPHIC STUDIES: I have personally reviewed the radiological images as listed and agreed with the findings in the report. No results found. EUS 03/09/2016 Dr. Ardis Hughs  - 3.5cm irregularly shaped, poorly defined mass in the pancreatic head. There is suspicious, nearby adenopathy. The mass is causing pancreatic and bililary duct obstruction (previously stented). The mass involves the portal vein for  64mm (abuttment and loss of usual tissue interface), strongly suggesting invasion. Preliminary cytology results are positive for malignancy (adenocarcinoma), await final report.  ASSESSMENT & PLAN: 60 year old Caucasian male, with past medical history of diabetes and hypertension, presented with epigastric pain, weight loss, and jaundice.  1. Primary pancreatic adenocarcinoma, in pancreas head, cT2N1M0, stage IIB, borderline resectable  -I have reviewed his CT abdomen and pelvis with contrast, EUS and biopsy findings with patient and his wife in great details. -His case was reviewed in our GI tumor board a few days ago. CT scan and US showed possible portal vein invasion from the pancreatic tumor, this is borderline resectable disease. No evidence of distant metastasis.  -We reviewed the nature history of pancreatic cancer, and the overall survival rate with chemotherapy, surgery, and radiation. Patient and his wife was discouraged by the overall dismal long term survival rate (~20%)  -He was seen by surgeon Dr. Barry Dienes today, Whipple surgery was discussed and offered to patient.  -I reviewed the high risk of cancer recurrence after surgery, and the role of neoadjuvant and adjuvant chemotherapy to reduce the risk of recurrence, and shrink the tumor before surgery. -I discussed with Dr. Barry Dienes, we recommend patient to have neoadjuvant chemotherapy first, and a possible SBRT after chemo, based on his next staging scan findings after chemo.  -He has started neoadjuvant chemotherapy with FOLFIRINOX, tolerated first two cycles well overall. -Lab results reviewed with him, his blood glucose has been much better, 144 today. We'll proceed with cycle 3 chemotherapy today -I'll plan to repeat his CT scan after fourth cycle chemotherapy, and present his case in GI tumor Board.  2. Type 2  DM - he will continue medication and follow up with his primary care physician Dr. Sarajane Jews  -We reviewed that his blood glucose  will need to be monitored closely during the chemotherapy, and his medication may need to be adjusted  -His blood glucose has been significantly elevated, fasting in 150-mid 200s, postprandial in 500 range, his insulin has been adjusted, he is also more compliant with his diabetic diet  -His blood glucose overall has much improved. We again discussed diabetic diet.   3. HTN -He is on amlodipine and benazepril  -His blood pressure is normal today -The discussed chemotherapy may affect his blood pressure, and he is medication may need to be adjusted  Plan -lab reviewed, will proceed cycle 3 FOLFIRINOX today, premedication dexamethasone to 4mg , due to his diabetes, Neulasta on day 3 -I'll see him back in 2 weeks before chemotherapy cycle 4 -I well schedule his restaging CT scan for October 20, and cycle 5 chemotherapy on October 25. We'll present his case in our GI tumor Board on October 25  All questions were answered. The patient knows to call the clinic with any problems, questions or concerns.  I spent 20 minutes counseling the patient face to face. The total time spent in the appointment was 25 minutes and more than 50% was on counseling.     Truitt Merle, MD 04/25/2016

## 2016-04-26 ENCOUNTER — Other Ambulatory Visit: Payer: Self-pay | Admitting: Family Medicine

## 2016-04-26 LAB — CANCER ANTIGEN 19-9: CA 19-9: 599 U/mL — ABNORMAL HIGH (ref 0–35)

## 2016-04-27 ENCOUNTER — Ambulatory Visit (HOSPITAL_BASED_OUTPATIENT_CLINIC_OR_DEPARTMENT_OTHER): Payer: 59

## 2016-04-27 ENCOUNTER — Other Ambulatory Visit: Payer: Self-pay | Admitting: Family Medicine

## 2016-04-27 VITALS — BP 124/75 | HR 88 | Temp 98.2°F | Resp 18

## 2016-04-27 DIAGNOSIS — Z5189 Encounter for other specified aftercare: Secondary | ICD-10-CM | POA: Diagnosis not present

## 2016-04-27 DIAGNOSIS — C250 Malignant neoplasm of head of pancreas: Secondary | ICD-10-CM | POA: Diagnosis not present

## 2016-04-27 DIAGNOSIS — Z95828 Presence of other vascular implants and grafts: Secondary | ICD-10-CM

## 2016-04-27 MED ORDER — PEGFILGRASTIM INJECTION 6 MG/0.6ML
6.0000 mg | Freq: Once | SUBCUTANEOUS | Status: AC
  Administered 2016-04-27: 6 mg via SUBCUTANEOUS
  Filled 2016-04-27: qty 0.6

## 2016-04-27 MED ORDER — HEPARIN SOD (PORK) LOCK FLUSH 100 UNIT/ML IV SOLN
500.0000 [IU] | Freq: Once | INTRAVENOUS | Status: AC | PRN
  Administered 2016-04-27: 500 [IU]
  Filled 2016-04-27: qty 5

## 2016-04-27 MED ORDER — SODIUM CHLORIDE 0.9% FLUSH
10.0000 mL | INTRAVENOUS | Status: DC | PRN
  Administered 2016-04-27: 10 mL
  Filled 2016-04-27: qty 10

## 2016-05-09 ENCOUNTER — Other Ambulatory Visit (HOSPITAL_BASED_OUTPATIENT_CLINIC_OR_DEPARTMENT_OTHER): Payer: 59

## 2016-05-09 ENCOUNTER — Encounter: Payer: Self-pay | Admitting: Hematology

## 2016-05-09 ENCOUNTER — Telehealth: Payer: Self-pay | Admitting: Hematology

## 2016-05-09 ENCOUNTER — Ambulatory Visit (HOSPITAL_BASED_OUTPATIENT_CLINIC_OR_DEPARTMENT_OTHER): Payer: 59 | Admitting: Hematology

## 2016-05-09 ENCOUNTER — Ambulatory Visit: Payer: 59

## 2016-05-09 ENCOUNTER — Ambulatory Visit (HOSPITAL_BASED_OUTPATIENT_CLINIC_OR_DEPARTMENT_OTHER): Payer: 59

## 2016-05-09 ENCOUNTER — Ambulatory Visit: Payer: 59 | Admitting: Nutrition

## 2016-05-09 ENCOUNTER — Encounter: Payer: Self-pay | Admitting: *Deleted

## 2016-05-09 VITALS — BP 141/72 | HR 88 | Temp 98.7°F | Resp 18 | Ht 70.0 in | Wt 189.8 lb

## 2016-05-09 DIAGNOSIS — I10 Essential (primary) hypertension: Secondary | ICD-10-CM | POA: Diagnosis not present

## 2016-05-09 DIAGNOSIS — C250 Malignant neoplasm of head of pancreas: Secondary | ICD-10-CM

## 2016-05-09 DIAGNOSIS — E119 Type 2 diabetes mellitus without complications: Secondary | ICD-10-CM | POA: Diagnosis not present

## 2016-05-09 DIAGNOSIS — IMO0001 Reserved for inherently not codable concepts without codable children: Secondary | ICD-10-CM

## 2016-05-09 DIAGNOSIS — Z95828 Presence of other vascular implants and grafts: Secondary | ICD-10-CM

## 2016-05-09 DIAGNOSIS — Z794 Long term (current) use of insulin: Secondary | ICD-10-CM

## 2016-05-09 DIAGNOSIS — E1165 Type 2 diabetes mellitus with hyperglycemia: Secondary | ICD-10-CM

## 2016-05-09 DIAGNOSIS — Z5111 Encounter for antineoplastic chemotherapy: Secondary | ICD-10-CM

## 2016-05-09 LAB — COMPREHENSIVE METABOLIC PANEL
ALT: 29 U/L (ref 0–55)
AST: 23 U/L (ref 5–34)
Albumin: 3.5 g/dL (ref 3.5–5.0)
Alkaline Phosphatase: 201 U/L — ABNORMAL HIGH (ref 40–150)
Anion Gap: 11 mEq/L (ref 3–11)
BUN: 14 mg/dL (ref 7.0–26.0)
CO2: 25 mEq/L (ref 22–29)
Calcium: 9.8 mg/dL (ref 8.4–10.4)
Chloride: 107 mEq/L (ref 98–109)
Creatinine: 0.6 mg/dL — ABNORMAL LOW (ref 0.7–1.3)
EGFR: >90 mL/min/{1.73_m2} (ref >90)
Glucose: 79 mg/dl (ref 70–140)
Potassium: 3.6 mEq/L (ref 3.5–5.1)
Sodium: 143 mEq/L (ref 136–145)
Total Bilirubin: <0.3 mg/dL (ref 0.20–1.20)
Total Protein: 7.8 g/dL (ref 6.4–8.3)

## 2016-05-09 LAB — CBC WITH DIFFERENTIAL/PLATELET
BASO%: 1 % (ref 0.0–2.0)
Basophils Absolute: 0.2 10*3/uL — ABNORMAL HIGH (ref 0.0–0.1)
EOS%: 2.1 % (ref 0.0–7.0)
Eosinophils Absolute: 0.3 10*3/uL (ref 0.0–0.5)
HCT: 37.2 % — ABNORMAL LOW (ref 38.4–49.9)
HGB: 12 g/dL — ABNORMAL LOW (ref 13.0–17.1)
LYMPH%: 17.1 % (ref 14.0–49.0)
MCH: 29.3 pg (ref 27.2–33.4)
MCHC: 32.2 g/dL (ref 32.0–36.0)
MCV: 91 fL (ref 79.3–98.0)
MONO#: 1.4 10*3/uL — ABNORMAL HIGH (ref 0.1–0.9)
MONO%: 9.1 % (ref 0.0–14.0)
NEUT#: 11 10*3/uL — ABNORMAL HIGH (ref 1.5–6.5)
NEUT%: 70.7 % (ref 39.0–75.0)
Platelets: 346 10*3/uL (ref 140–400)
RBC: 4.09 10*6/uL — ABNORMAL LOW (ref 4.20–5.82)
RDW: 15.7 % — ABNORMAL HIGH (ref 11.0–14.6)
WBC: 15.5 10*3/uL — ABNORMAL HIGH (ref 4.0–10.3)
lymph#: 2.7 10*3/uL (ref 0.9–3.3)

## 2016-05-09 MED ORDER — SODIUM CHLORIDE 0.9 % IJ SOLN
10.0000 mL | INTRAMUSCULAR | Status: DC | PRN
  Administered 2016-05-09: 10 mL via INTRAVENOUS
  Filled 2016-05-09: qty 10

## 2016-05-09 MED ORDER — LEUCOVORIN CALCIUM INJECTION 350 MG
400.0000 mg/m2 | Freq: Once | INTRAMUSCULAR | Status: AC
  Administered 2016-05-09: 812 mg via INTRAVENOUS
  Filled 2016-05-09: qty 40.6

## 2016-05-09 MED ORDER — FLUOROURACIL CHEMO INJECTION 2.5 GM/50ML
400.0000 mg/m2 | Freq: Once | INTRAVENOUS | Status: AC
  Administered 2016-05-09: 800 mg via INTRAVENOUS
  Filled 2016-05-09: qty 16

## 2016-05-09 MED ORDER — ATROPINE SULFATE 1 MG/ML IJ SOLN
INTRAMUSCULAR | Status: AC
  Filled 2016-05-09: qty 1

## 2016-05-09 MED ORDER — PALONOSETRON HCL INJECTION 0.25 MG/5ML
0.2500 mg | Freq: Once | INTRAVENOUS | Status: AC
  Administered 2016-05-09: 0.25 mg via INTRAVENOUS

## 2016-05-09 MED ORDER — SODIUM CHLORIDE 0.9 % IV SOLN
2400.0000 mg/m2 | INTRAVENOUS | Status: DC
  Administered 2016-05-09: 4850 mg via INTRAVENOUS
  Filled 2016-05-09: qty 97

## 2016-05-09 MED ORDER — SODIUM CHLORIDE 0.9 % IV SOLN
180.0000 mg/m2 | Freq: Once | INTRAVENOUS | Status: AC
  Administered 2016-05-09: 360 mg via INTRAVENOUS
  Filled 2016-05-09: qty 10

## 2016-05-09 MED ORDER — ATROPINE SULFATE 1 MG/ML IJ SOLN
0.5000 mg | Freq: Once | INTRAMUSCULAR | Status: AC | PRN
  Administered 2016-05-09: 0.5 mg via INTRAVENOUS

## 2016-05-09 MED ORDER — PALONOSETRON HCL INJECTION 0.25 MG/5ML
INTRAVENOUS | Status: AC
  Filled 2016-05-09: qty 5

## 2016-05-09 MED ORDER — OXALIPLATIN CHEMO INJECTION 100 MG/20ML
85.0000 mg/m2 | Freq: Once | INTRAVENOUS | Status: AC
  Administered 2016-05-09: 175 mg via INTRAVENOUS
  Filled 2016-05-09: qty 35

## 2016-05-09 MED ORDER — SODIUM CHLORIDE 0.9 % IV SOLN
4.0000 mg | Freq: Once | INTRAVENOUS | Status: AC
  Administered 2016-05-09: 4 mg via INTRAVENOUS
  Filled 2016-05-09: qty 0.4

## 2016-05-09 NOTE — Progress Notes (Signed)
[place]  [phone] [phone]  Clinic Follow up Note   Patient Care Team: [name], MD as PCP - General 05/09/2016  CHIEF COMPLAINTS:  Follow up pancreatic cancer  Oncology History   Adenocarcinoma of head of pancreas [facility])   Staging form: Pancreas, AJCC 7th Edition   - Clinical stage from 03/09/2016: Stage IIB (T2, N1, M0) - Signed by Truitt Merle, MD on 03/17/2016      Adenocarcinoma of head of pancreas ([place])   02/27/2016 Imaging    CT chest, abdomen and pelvis with contrast showed ill-defined heterogeneity of pancreatic head, highly suspicious for malignancy, ) quit about and biliary ductal dilatation, mildly prominent lymph nodes in the upper abdomen, largest 2 cm in the portocaval . No other metastasis.       03/09/2016 Initial Diagnosis    Adenocarcinoma of head of pancreas ([place])      03/09/2016 Procedure    Upper EUS showed a 3.5 cm irregular mass in the pancreatic head, causing pancreatic and biliary duct obstruction. The mass involves the portal vein 422 mm, strongly suggesting invasion. There is suspicious nearby adenopathy      03/09/2016 Initial Biopsy    Fine-needle aspiration of the pancreatic mass from EUS showed malignant cells consistent with adenocarcinoma.       03/28/2016 -  Chemotherapy    neoadjuvant chemo with FOLFIRINOX every 2 weeks       HISTORY OF PRESENTING ILLNESS:  Colton Bush 60 y.o. male is here because of His newly diagnosed pancreatic cancer. He is accompanied by his wife to our multidisciplinary GI clinic today.  He noticed fatigue, anorexia and weight loss about 50bs in the past 4 months. It started when his endocrinologist changed his diabetic meds. He presented to hospital on 7/30 with jaundice. No pain or nausea. CT scan revealed a heterogeneous pancreatic head mass, highly suspicious for malignancy. Mild biliary and pancreatic duct dilatation, mildly prominent lymph nodes in the upper  abdomen. He underwent ERCP and CBD stent placement, and subsequent upper EUS on 03/09/2016, which showed a 3.5 cm irregular mass in the pancreatic head, the mass involves the portal vein, no suspicious nearby adenopathy.  He has been feeling better after stent placement, with improved appetite and energy level. He feels normal again. No symptoms. BM is good, he has gained about 8lbs back.   CURRENT THERAPY: neoadjuvant chemo with FOLFIRINOX every 2 weeks, started on 03/28/2016  INTERIM HISTORY: MR. Giroux returns for follow up and 4th cycle chemo. He is doing well overall, has been tolerating chemotherapy well. His blood glucose has been better controlled lately, most in the range of 100-180. No hyperglycemia. He has moderate fatigue and low appetite after chemotherapy, recovers well. No other complaints. 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 His weight is stable.  MEDICAL HISTORY:  Past Medical History:  Diagnosis Date  . Arthritis    left hand  . Bronchitis 1977  . Cancer [facility])    pancreatic cancer  . Depression    takes Cymbalta daily  . Diabetes mellitus type II    sees [name]   . GERD (gastroesophageal reflux disease)    takes Omeprazole daily  . H/O hiatal hernia   . Hyperlipidemia    takes Zocor daily  . Hypertension    takes Amlodipine daily  . Neck pain    C4-7 stenosis and herniated  disc  . Neuromuscular disorder ([place])    hiatal hernia  . Scoliosis    slight  . Spinal cord injury, C5-C7 ([place])    c4-c7  . Stiffness of hand joint    d/t cervical issues    SURGICAL HISTORY: Past Surgical History:  Procedure Laterality Date  . ANTERIOR CERVICAL DECOMP/DISCECTOMY FUSION  08/18/2011   Procedure: ANTERIOR CERVICAL DECOMPRESSION/DISCECTOMY FUSION 3 LEVELS;  Surgeon: Winfield Cunas, MD;  Location: [place] NEURO ORS;  Service: Neurosurgery;  Laterality: N/A;  Anterior Cervical Four-Five/Five-Six/Six-Seven  Decompression with Fusion, Plating, and Bonegraft  . CARPAL TUNNEL RELEASE  2013   bilateral, per Dr. Christella Noa   . COLONOSCOPY  10-30-14   per Dr. Olevia Perches, clear, repeat in 10 yrs   . egd with esophageal dilation  9-08   per Dr. Olevia Perches  . ERCP N/A 03/01/2016   Procedure: ENDOSCOPIC RETROGRADE CHOLANGIOPANCREATOGRAPHY (ERCP) with brushings and stent;  Surgeon: Doran Stabler, MD;  Location: [facility] ENDOSCOPY;  Service: Endoscopy;  Laterality: N/A;  . EUS N/A 03/09/2016   Procedure: ESOPHAGEAL ENDOSCOPIC ULTRASOUND (EUS) RADIAL;  Surgeon: Milus Banister, MD;  Location: [facility] ENDOSCOPY;  Service: Endoscopy;  Laterality: N/A;  . lymph nodes biopsy    . melanoma rt calf  1999  . PORTACATH PLACEMENT Left 03/22/2016   Procedure: INSERTION PORT-A-CATH;  Surgeon: Stark Klein, MD;  Location: [facility] ORS;  Service: General;  Laterality: Left;  . SPINE SURGERY    . TONSILLECTOMY     as a child  . ULNAR TUNNEL RELEASE  2013   right arm, per Dr. Christella Noa   . UPPER GASTROINTESTINAL ENDOSCOPY      SOCIAL HISTORY: Social History   Social History  . Marital status: Married    Spouse name: [name]  . Number of children: N/A  . Years of education: N/A   Occupational History  . Driver    Social History Main Topics  . Smoking status: Never Smoker  . Smokeless tobacco: Never Used     Comment: tried a pipe 35 years ago   . Alcohol use No  . Drug use: No  . Sexual activity: Yes   Other Topics Concern  . Not on file   Social History Narrative   Married, wife Rosaria Ferries Nutritional therapist-   He works as a Geophysicist/field seismologist for a company  He has one son 54 yo. Lives with his wife   FAMILY HISTORY: Family History  Problem Relation Age of Onset  . Heart disease Father   . Heart disease Brother 96  . Anesthesia problems Mother   . Heart disease Mother   . Dementia Mother   . Diabetes Sister   . Stroke Sister   . Colon cancer Neg Hx   . Rectal cancer Neg Hx   . Stomach cancer Neg Hx     ALLERGIES:  has No Known  Allergies.  MEDICATIONS:  Current Outpatient Prescriptions  Medication Sig Dispense Refill  . amLODipine (NORVASC) 10 MG tablet TAKE 1 TABLET BY MOUTH EVERY DAY 30 tablet 6  . benazepril (LOTENSIN) 10 MG tablet TAKE 1 TABLET (10 MG TOTAL) BY MOUTH DAILY. 30 tablet 4  . docusate sodium (COLACE) 100 MG capsule Take 1 capsule (100 mg total) by mouth 2 (two) times daily. 10 capsule 0  . insulin aspart (NOVOLOG) 100 UNIT/ML injection Give every morning with breakfast and every evening with supper per the following sliding scale: give 3 units for glucose 150-175, give 6 units for glucose  176-200, give 9 units for glucose 201-250, give 12 units for glucose greater than 250 3 vial 11  . Insulin Detemir (LEVEMIR) 100 UNIT/ML Pen Inject 20 Units into the skin 2 (two) times daily. Inject 20 units into the skin each evening. 30 mL 3  . insulin glargine (LANTUS) 100 UNIT/ML injection Inject 0.2 mLs (20 Units total) into the skin 2 (two) times daily. 10 mL 11  . Insulin Pen Needle 30G X 5 MM MISC Use one daily with insulin 100 each 2  . JARDIANCE 25 MG TABS tablet TAKE 1 TABLET BY MOUTH EVERY DAY 90 tablet 1  . Lancets (FREESTYLE) lancets USE AS INSTRUCTED TO CHECK BLOOD SUGAR ONCE A DAY 100 each 1  . lidocaine-prilocaine (EMLA) cream Apply to affected area once (Patient taking differently: Apply 1 application topically as needed (for port). ) 30 g 3  . Multiple Vitamin (MULTIVITAMIN WITH MINERALS) TABS tablet Take 1 tablet by mouth daily.    Marland Kitchen omega-3 acid ethyl esters (LOVAZA) 1 g capsule Take 2 g by mouth daily.    Marland Kitchen omeprazole (PRILOSEC) 20 MG capsule Take 20 mg by mouth daily.    . ondansetron (ZOFRAN) 8 MG tablet Take 1 tablet (8 mg total) by mouth 2 (two) times daily as needed for refractory nausea / vomiting. Start on day 3 after chemotherapy. 30 tablet 1  . oxyCODONE (OXY IR/ROXICODONE) 5 MG immediate release tablet Take 1-2 tablets (5-10 mg total) by mouth every 4 (four) hours as needed for moderate  pain. 30 tablet 0  . polyethylene glycol (MIRALAX / GLYCOLAX) packet Take 17 g by mouth daily as needed for mild constipation. 14 each 0  . pravastatin (PRAVACHOL) 40 MG tablet TAKE 1 TABLET (40 MG TOTAL) BY MOUTH DAILY. 90 tablet 1  . prochlorperazine (COMPAZINE) 10 MG tablet Take 1 tablet (10 mg total) by mouth every 6 (six) hours as needed (NAUSEA). 30 tablet 1   No current facility-administered medications for this visit.     REVIEW OF SYSTEMS:   Constitutional: Denies fevers, chills or abnormal night sweats Eyes: Denies blurriness of vision, double vision or watery eyes Ears, nose, mouth, throat, and face: Denies mucositis or sore throat Respiratory: Denies cough, dyspnea or wheezes Cardiovascular: Denies palpitation, chest discomfort or lower extremity swelling Gastrointestinal:  Denies nausea, heartburn or change in bowel habits Skin: Denies abnormal skin rashes Lymphatics: Denies new lymphadenopathy or easy bruising Neurological:Denies numbness, tingling or new weaknesses Behavioral/Psych: Mood is stable, no new changes  All other systems were reviewed with the patient and are negative.  PHYSICAL EXAMINATION: ECOG PERFORMANCE STATUS: 1  Vitals:   05/09/16 0858  BP: (!) 141/72  Pulse: 88  Resp: 18  Temp: 98.7 F (37.1 C)   Filed Weights   05/09/16 0858  Weight: 189 lb 12.8 oz (86.1 kg)    GENERAL:alert, no distress and comfortable SKIN: skin color, texture, turgor are normal, no rashes or significant lesions EYES: normal, conjunctiva are pink and non-injected, sclera clear OROPHARYNX:no exudate, no erythema and lips, buccal mucosa, and tongue normal  NECK: supple, thyroid normal size, non-tender, without nodularity LYMPH:  no palpable lymphadenopathy in the cervical, axillary or inguinal LUNGS: clear to auscultation and percussion with normal breathing effort HEART: regular rate & rhythm and no murmurs and no lower extremity edema ABDOMEN:abdomen soft, non-tender  and normal bowel sounds Musculoskeletal:no cyanosis of digits and no clubbing  PSYCH: alert & oriented x 3 with fluent speech NEURO: no focal motor/sensory deficits  LABORATORY  DATA:  I have reviewed the data as listed CBC Latest Ref Rng & Units 05/09/2016 04/25/2016 04/11/2016  WBC 4.0 - 10.3 10e3/uL 15.5(H) 13.9(H) 18.6(H)  Hemoglobin 13.0 - 17.1 g/dL 12.0(L) 11.7(L) 11.0(L)  Hematocrit 38.4 - 49.9 % 37.2(L) 34.8(L) 33.4(L)  Platelets 140 - 400 10e3/uL 346 391 316   CMP Latest Ref Rng & Units 05/09/2016 04/25/2016 04/11/2016  Glucose 70 - 140 mg/dl 79 144(H) 128  BUN 7.0 - 26.0 mg/dL 14.0 12.8 13.8  Creatinine 0.7 - 1.3 mg/dL 0.6(L) 0.6(L) 0.6(L)  Sodium 136 - 145 mEq/L 143 141 141  Potassium 3.5 - 5.1 mEq/L 3.6 3.8 3.6  Chloride 101 - 111 mmol/L - - -  CO2 22 - 29 mEq/L 25 25 27   Calcium 8.4 - 10.4 mg/dL 9.8 9.7 9.3  Total Protein 6.4 - 8.3 g/dL 7.8 7.7 7.0  Total Bilirubin 0.20 - 1.20 mg/dL <0.30 0.30 0.45  Alkaline Phos 40 - 150 U/L 201(H) 201(H) 234(H)  AST 5 - 34 U/L 23 20 36(H)  ALT 0 - 55 U/L 29 32 47   CA19.9 (0-35 U/ML) 02/28/2016: 1497 03/24/2016: 648 04/25/2016: 599  PATHOLOGY REPORT  Diagnosis 03/09/2016 FINE NEEDLE ASPIRATION, ENDOSCOPIC, PANCREAS (SPECIMEN 1 OF 1 COLLECTED 03/09/16): MALIGNANT CELLS CONSISTENT WITH ADENOCARCINOMA. BACKGROUND NECROTIC DEBRIS AND ACUTE INFLAMMATION.  Diagnosis 03/01/2016 BILE DUCT BRUSHING(SPECIMEN 1 OF 1 COLLECTED 03/01/16): RARE ATYPICAL CELLS, SEE COMMENT.  RADIOGRAPHIC STUDIES: I have personally reviewed the radiological images as listed and agreed with the findings in the report. No results found. EUS 03/09/2016 Dr. Ardis Hughs  - 3.5cm irregularly shaped, poorly defined mass in the pancreatic head. There is suspicious, nearby adenopathy. The mass is causing pancreatic and bililary duct obstruction (previously stented). The mass involves the portal vein for 66mm (abuttment and loss of usual tissue interface), strongly suggesting  invasion. Preliminary cytology results are positive for malignancy (adenocarcinoma), await final report.  ASSESSMENT & PLAN: 60 year old Caucasian male, with past medical history of diabetes and hypertension, presented with epigastric pain, weight loss, and jaundice.  1. Primary pancreatic adenocarcinoma, in pancreas head, cT2N1M0, stage IIB, borderline resectable  -I have reviewed his CT abdomen and pelvis with contrast, EUS and biopsy findings with patient and his wife in great details. -His case was reviewed in our GI tumor board a few days ago. CT scan and US showed possible portal vein invasion from the pancreatic tumor, this is borderline resectable disease. No evidence of distant metastasis.  -We reviewed the nature history of pancreatic cancer, and the overall survival rate with chemotherapy, surgery, and radiation. Patient and his wife was discouraged by the overall dismal long term survival rate (~20%)  -He was seen by surgeon Dr. Barry Dienes, Whipple surgery was discussed and offered to patient.  -I reviewed the high risk of cancer recurrence after surgery, and the role of neoadjuvant and adjuvant chemotherapy to reduce the risk of recurrence, and shrink the tumor before surgery. -I discussed with Dr. Barry Dienes, we recommend patient to have neoadjuvant chemotherapy first, and a possible SBRT after chemo, based on his next staging scan findings after chemo.  -He has started neoadjuvant chemotherapy with FOLFIRINOX, tolerated well so far -Lab results reviewed with him, his blood glucose has been much better lately. We'll proceed with cycle 4 chemotherapy today -He is scheduled for restaging CT scan on October 20  2. Type 2 DM - he will continue medication and follow up with his primary care physician Dr. Sarajane Jews  -We reviewed that his blood glucose will need  to be monitored closely during the chemotherapy, and his medication may need to be adjusted  -His blood glucose has been significantly elevated,  fasting in 150-mid 200s, postprandial in 500 range, his insulin has been adjusted, he is also more compliant with his diabetic diet  -His blood glucose overall has much improved. We again discussed diabetic diet.   3. HTN -He is on amlodipine and benazepril  -His blood pressure is normal today -The discussed chemotherapy may affect his blood pressure, and he is medication may need to be adjusted  Plan -lab reviewed, will proceed cycle 4 FOLFIRINOX today, Neulasta on day 3 -I well schedule his restaging CT scan for October 20, and cycle 5 chemotherapy on October 25. We'll present his case in our GI tumor Board on October 25 -I wrote a letter for him to restart working on October 30  All questions were answered. The patient knows to call the clinic with any problems, questions or concerns.  I spent 20 minutes counseling the patient face to face. The total time spent in the appointment was 25 minutes and more than 50% was on counseling.     Truitt Merle, MD 05/09/2016

## 2016-05-09 NOTE — Telephone Encounter (Signed)
GAVE PATIENT AVS REPORT AND APPOINTMENTS FOR October. [facility] WILL CALL RE SCAN. °

## 2016-05-09 NOTE — Progress Notes (Signed)
Oncology Nurse Navigator Documentation  Oncology Nurse Navigator Flowsheets 05/09/2016  Navigator Location [facility]  Navigator Encounter Type Treatment  Telephone -  Abnormal Finding Date -  Confirmed Diagnosis Date -  Treatment Initiated Date -  Patient Visit Type MedOnc  Treatment Phase Active Tx--FOLFIRINOX  Barriers/Navigation Needs Coordination of Care--CT scan   Education -  Interventions Coordination of Care--called central scheduling for scan after PA completed  Coordination of Care Radiology--CT 05/19/16 at 0915/0930  Education Method -  Support Groups/Services -  Acuity -  Time Spent with Patient 15  Mr. Denise feels he is tolerating his treatment well. Has plans to go back to work in November. Provided information for his CT scan. He already has his two bottles of contrast at home.

## 2016-05-09 NOTE — Patient Instructions (Signed)
[facility] Discharge Instructions for Patients Receiving Chemotherapy  Today you received the following chemotherapy agents:  Oxaliplatin, Leucovorin, Irinotecan, Fluorouracil  To help prevent nausea and vomiting after your treatment, we encourage you to take your nausea medication as prescribed.   If you develop nausea and vomiting that is not controlled by your nausea medication, call the clinic.   BELOW ARE SYMPTOMS THAT SHOULD BE REPORTED IMMEDIATELY:  *FEVER GREATER THAN 100.5 F  *CHILLS WITH OR WITHOUT FEVER  NAUSEA AND VOMITING THAT IS NOT CONTROLLED WITH YOUR NAUSEA MEDICATION  *UNUSUAL SHORTNESS OF BREATH  *UNUSUAL BRUISING OR BLEEDING  TENDERNESS IN MOUTH AND THROAT WITH OR WITHOUT PRESENCE OF ULCERS  *URINARY PROBLEMS  *BOWEL PROBLEMS  UNUSUAL RASH Items with * indicate a potential emergency and should be followed up as soon as possible.  Feel free to call the clinic you have any questions or concerns. The clinic phone number is [phone].  Please show the CHEMO ALERT CARD at check-in to the Emergency Department and triage nurse.   

## 2016-05-10 ENCOUNTER — Telehealth: Payer: Self-pay | Admitting: *Deleted

## 2016-05-10 ENCOUNTER — Encounter: Payer: Self-pay | Admitting: Hematology

## 2016-05-10 NOTE — Telephone Encounter (Signed)
Oncology Nurse Navigator Documentation  Oncology Nurse Navigator Flowsheets 05/10/2016  Navigator Location [facility]  Navigator Encounter Type Telephone  Telephone Incoming Call;Outgoing Call  Abnormal Finding Date -  Confirmed Diagnosis Date -  Treatment Initiated Date -  Patient Visit Type -  Treatment Phase -  Barriers/Navigation Needs Coordination of Care--call to request CT be rescheduled to 10/17  Education -  Interventions Coordination of Care--rescheduled for 10/17 at 2:15/2:30  Coordination of Care Radiology--CT patient notified and also sent mychart message  Education Method -  Support Groups/Services -  Acuity Level 2  Time Spent with Patient 15

## 2016-05-11 ENCOUNTER — Ambulatory Visit (HOSPITAL_BASED_OUTPATIENT_CLINIC_OR_DEPARTMENT_OTHER): Payer: 59

## 2016-05-11 ENCOUNTER — Ambulatory Visit: Payer: 59

## 2016-05-11 VITALS — BP 109/63 | HR 87 | Temp 98.0°F | Resp 18

## 2016-05-11 DIAGNOSIS — C250 Malignant neoplasm of head of pancreas: Secondary | ICD-10-CM | POA: Diagnosis not present

## 2016-05-11 MED ORDER — HEPARIN SOD (PORK) LOCK FLUSH 100 UNIT/ML IV SOLN
500.0000 [IU] | Freq: Once | INTRAVENOUS | Status: AC | PRN
  Administered 2016-05-11: 500 [IU]
  Filled 2016-05-11: qty 5

## 2016-05-11 MED ORDER — SODIUM CHLORIDE 0.9% FLUSH
10.0000 mL | INTRAVENOUS | Status: DC | PRN
  Administered 2016-05-11: 10 mL
  Filled 2016-05-11: qty 10

## 2016-05-11 MED ORDER — PEGFILGRASTIM INJECTION 6 MG/0.6ML ~~LOC~~
6.0000 mg | PREFILLED_SYRINGE | Freq: Once | SUBCUTANEOUS | Status: AC
  Administered 2016-05-11: 6 mg via SUBCUTANEOUS
  Filled 2016-05-11: qty 0.6

## 2016-05-16 ENCOUNTER — Encounter (HOSPITAL_COMMUNITY): Payer: Self-pay

## 2016-05-16 ENCOUNTER — Ambulatory Visit (HOSPITAL_COMMUNITY)
Admission: RE | Admit: 2016-05-16 | Discharge: 2016-05-16 | Disposition: A | Discharge status: Home / Self Care | Payer: 59 | Source: Ambulatory Visit | Attending: Hematology | Admitting: Hematology

## 2016-05-16 DIAGNOSIS — M5136 Other intervertebral disc degeneration, lumbar region: Secondary | ICD-10-CM | POA: Insufficient documentation

## 2016-05-16 DIAGNOSIS — I2510 Atherosclerotic heart disease of native coronary artery without angina pectoris: Secondary | ICD-10-CM | POA: Diagnosis not present

## 2016-05-16 DIAGNOSIS — D739 Disease of spleen, unspecified: Secondary | ICD-10-CM | POA: Diagnosis not present

## 2016-05-16 DIAGNOSIS — R590 Localized enlarged lymph nodes: Secondary | ICD-10-CM | POA: Diagnosis not present

## 2016-05-16 DIAGNOSIS — K829 Disease of gallbladder, unspecified: Secondary | ICD-10-CM | POA: Diagnosis not present

## 2016-05-16 DIAGNOSIS — I700 Atherosclerosis of aorta: Secondary | ICD-10-CM | POA: Diagnosis not present

## 2016-05-16 DIAGNOSIS — R911 Solitary pulmonary nodule: Secondary | ICD-10-CM | POA: Insufficient documentation

## 2016-05-16 DIAGNOSIS — C250 Malignant neoplasm of head of pancreas: Secondary | ICD-10-CM | POA: Diagnosis not present

## 2016-05-16 DIAGNOSIS — M47816 Spondylosis without myelopathy or radiculopathy, lumbar region: Secondary | ICD-10-CM | POA: Insufficient documentation

## 2016-05-16 MED ORDER — IOPAMIDOL (ISOVUE-300) INJECTION 61%
100.0000 mL | Freq: Once | INTRAVENOUS | Status: AC | PRN
  Administered 2016-05-16: 100 mL via INTRAVENOUS

## 2016-05-19 ENCOUNTER — Ambulatory Visit (HOSPITAL_COMMUNITY): Payer: 59

## 2016-05-23 ENCOUNTER — Other Ambulatory Visit: Payer: Self-pay | Admitting: Hematology

## 2016-05-23 ENCOUNTER — Other Ambulatory Visit: Payer: Self-pay

## 2016-05-23 ENCOUNTER — Ambulatory Visit: Payer: Self-pay | Admitting: Hematology

## 2016-05-24 ENCOUNTER — Ambulatory Visit (HOSPITAL_BASED_OUTPATIENT_CLINIC_OR_DEPARTMENT_OTHER): Payer: 59 | Admitting: Hematology

## 2016-05-24 ENCOUNTER — Other Ambulatory Visit: Payer: Self-pay | Admitting: *Deleted

## 2016-05-24 ENCOUNTER — Ambulatory Visit: Payer: 59

## 2016-05-24 ENCOUNTER — Other Ambulatory Visit (HOSPITAL_BASED_OUTPATIENT_CLINIC_OR_DEPARTMENT_OTHER): Payer: 59

## 2016-05-24 ENCOUNTER — Encounter: Payer: Self-pay | Admitting: Hematology

## 2016-05-24 ENCOUNTER — Ambulatory Visit (HOSPITAL_BASED_OUTPATIENT_CLINIC_OR_DEPARTMENT_OTHER): Payer: 59

## 2016-05-24 VITALS — BP 120/78 | HR 72 | Temp 98.3°F | Resp 16

## 2016-05-24 VITALS — Wt 190.5 lb

## 2016-05-24 DIAGNOSIS — I10 Essential (primary) hypertension: Secondary | ICD-10-CM

## 2016-05-24 DIAGNOSIS — E1165 Type 2 diabetes mellitus with hyperglycemia: Secondary | ICD-10-CM

## 2016-05-24 DIAGNOSIS — C250 Malignant neoplasm of head of pancreas: Secondary | ICD-10-CM

## 2016-05-24 DIAGNOSIS — Z794 Long term (current) use of insulin: Secondary | ICD-10-CM

## 2016-05-24 DIAGNOSIS — Z5111 Encounter for antineoplastic chemotherapy: Secondary | ICD-10-CM

## 2016-05-24 DIAGNOSIS — IMO0001 Reserved for inherently not codable concepts without codable children: Secondary | ICD-10-CM

## 2016-05-24 DIAGNOSIS — E119 Type 2 diabetes mellitus without complications: Secondary | ICD-10-CM

## 2016-05-24 LAB — CBC WITH DIFFERENTIAL/PLATELET
BASO%: 1.4 % (ref 0.0–2.0)
Basophils Absolute: 0.2 10*3/uL — ABNORMAL HIGH (ref 0.0–0.1)
EOS%: 1.7 % (ref 0.0–7.0)
Eosinophils Absolute: 0.2 10*3/uL (ref 0.0–0.5)
HCT: 36.8 % — ABNORMAL LOW (ref 38.4–49.9)
HGB: 12 g/dL — ABNORMAL LOW (ref 13.0–17.1)
LYMPH%: 17.3 % (ref 14.0–49.0)
MCH: 29.2 pg (ref 27.2–33.4)
MCHC: 32.5 g/dL (ref 32.0–36.0)
MCV: 89.8 fL (ref 79.3–98.0)
MONO#: 1.2 10*3/uL — ABNORMAL HIGH (ref 0.1–0.9)
MONO%: 9.9 % (ref 0.0–14.0)
NEUT#: 8.3 10*3/uL — ABNORMAL HIGH (ref 1.5–6.5)
NEUT%: 69.7 % (ref 39.0–75.0)
Platelets: 248 10*3/uL (ref 140–400)
RBC: 4.1 10*6/uL — ABNORMAL LOW (ref 4.20–5.82)
RDW: 16.1 % — ABNORMAL HIGH (ref 11.0–14.6)
WBC: 12 10*3/uL — ABNORMAL HIGH (ref 4.0–10.3)
lymph#: 2.1 10*3/uL (ref 0.9–3.3)

## 2016-05-24 LAB — COMPREHENSIVE METABOLIC PANEL
ALT: 26 U/L (ref 0–55)
AST: 20 U/L (ref 5–34)
Albumin: 3.5 g/dL (ref 3.5–5.0)
Alkaline Phosphatase: 182 U/L — ABNORMAL HIGH (ref 40–150)
Anion Gap: 9 mEq/L (ref 3–11)
BUN: 13.1 mg/dL (ref 7.0–26.0)
CO2: 25 mEq/L (ref 22–29)
Calcium: 9.6 mg/dL (ref 8.4–10.4)
Chloride: 108 mEq/L (ref 98–109)
Creatinine: 0.6 mg/dL — ABNORMAL LOW (ref 0.7–1.3)
EGFR: >90 mL/min/{1.73_m2} (ref >90)
Glucose: 152 mg/dl — ABNORMAL HIGH (ref 70–140)
Potassium: 3.4 mEq/L — ABNORMAL LOW (ref 3.5–5.1)
Sodium: 142 mEq/L (ref 136–145)
Total Bilirubin: 0.23 mg/dL (ref 0.20–1.20)
Total Protein: 7.7 g/dL (ref 6.4–8.3)

## 2016-05-24 MED ORDER — ATROPINE SULFATE 1 MG/ML IJ SOLN
INTRAMUSCULAR | Status: AC
  Filled 2016-05-24: qty 1

## 2016-05-24 MED ORDER — SODIUM CHLORIDE 0.9 % IV SOLN
400.0000 mg/m2 | Freq: Once | INTRAVENOUS | Status: AC
  Administered 2016-05-24: 812 mg via INTRAVENOUS
  Filled 2016-05-24: qty 40.6

## 2016-05-24 MED ORDER — SODIUM CHLORIDE 0.9 % IJ SOLN
10.0000 mL | Freq: Once | INTRAMUSCULAR | Status: AC
  Administered 2016-05-24: 10 mL
  Filled 2016-05-24: qty 10

## 2016-05-24 MED ORDER — PALONOSETRON HCL INJECTION 0.25 MG/5ML
INTRAVENOUS | Status: AC
  Filled 2016-05-24: qty 5

## 2016-05-24 MED ORDER — DEXAMETHASONE SODIUM PHOSPHATE 10 MG/ML IJ SOLN
4.0000 mg | Freq: Once | INTRAMUSCULAR | Status: AC
  Administered 2016-05-24: 4 mg via INTRAVENOUS

## 2016-05-24 MED ORDER — SODIUM CHLORIDE 0.9 % IV SOLN: 4.0000 mg | Freq: Once | INTRAVENOUS | Status: DC

## 2016-05-24 MED ORDER — FLUOROURACIL CHEMO INJECTION 2.5 GM/50ML
400.0000 mg/m2 | Freq: Once | INTRAVENOUS | Status: AC
  Administered 2016-05-24: 800 mg via INTRAVENOUS
  Filled 2016-05-24: qty 16

## 2016-05-24 MED ORDER — SODIUM CHLORIDE 0.9 % IV SOLN
180.0000 mg/m2 | Freq: Once | INTRAVENOUS | Status: AC
  Administered 2016-05-24: 360 mg via INTRAVENOUS
  Filled 2016-05-24: qty 10

## 2016-05-24 MED ORDER — PALONOSETRON HCL INJECTION 0.25 MG/5ML
0.2500 mg | Freq: Once | INTRAVENOUS | Status: AC
  Administered 2016-05-24: 0.25 mg via INTRAVENOUS

## 2016-05-24 MED ORDER — DEXTROSE 5 % IV SOLN
Freq: Once | INTRAVENOUS | Status: AC
  Administered 2016-05-24: 10:00:00 via INTRAVENOUS

## 2016-05-24 MED ORDER — ACETAMINOPHEN 325 MG PO TABS
ORAL_TABLET | ORAL | Status: AC
  Filled 2016-05-24: qty 2

## 2016-05-24 MED ORDER — ATROPINE SULFATE 1 MG/ML IJ SOLN
0.5000 mg | Freq: Once | INTRAMUSCULAR | Status: AC | PRN
  Administered 2016-05-24: 0.5 mg via INTRAVENOUS

## 2016-05-24 MED ORDER — OXALIPLATIN CHEMO INJECTION 100 MG/20ML
85.0000 mg/m2 | Freq: Once | INTRAVENOUS | Status: AC
  Administered 2016-05-24: 175 mg via INTRAVENOUS
  Filled 2016-05-24: qty 35

## 2016-05-24 MED ORDER — SODIUM CHLORIDE 0.9 % IV SOLN
2400.0000 mg/m2 | INTRAVENOUS | Status: DC
  Administered 2016-05-24: 4850 mg via INTRAVENOUS
  Filled 2016-05-24: qty 97

## 2016-05-24 MED ORDER — SODIUM CHLORIDE 0.9 % IJ SOLN
10.0000 mL | Freq: Once | INTRAMUSCULAR | Status: DC
  Filled 2016-05-24: qty 10

## 2016-05-24 MED ORDER — DEXAMETHASONE SODIUM PHOSPHATE 10 MG/ML IJ SOLN
INTRAMUSCULAR | Status: AC
  Filled 2016-05-24: qty 1

## 2016-05-24 NOTE — Progress Notes (Signed)
Colton Bush  [phone] [phone]  Clinic Follow up Note   Patient Care Team: [name], MD as PCP - General 05/24/2016  CHIEF COMPLAINTS:  Follow up pancreatic cancer  Oncology History   Adenocarcinoma of head of pancreas [facility])   Staging form: Pancreas, AJCC 7th Edition   - Clinical stage from 03/09/2016: Stage IIB (T2, N1, M0) - Signed by Truitt Merle, MD on 03/17/2016      Adenocarcinoma of head of pancreas ([place])   02/27/2016 Imaging    CT chest, abdomen and pelvis with contrast showed ill-defined heterogeneity of pancreatic head, highly suspicious for malignancy, ) quit about and biliary ductal dilatation, mildly prominent lymph nodes in the upper abdomen, largest 2 cm in the portocaval . No other metastasis.       03/09/2016 Initial Diagnosis    Adenocarcinoma of head of pancreas ([place])      03/09/2016 Procedure    Upper EUS showed a 3.5 cm irregular mass in the pancreatic head, causing pancreatic and biliary duct obstruction. The mass involves the portal vein 422 mm, strongly suggesting invasion. There is suspicious nearby adenopathy      03/09/2016 Initial Biopsy    Fine-needle aspiration of the pancreatic mass from EUS showed malignant cells consistent with adenocarcinoma.       03/28/2016 -  Chemotherapy    neoadjuvant chemo with FOLFIRINOX every 2 weeks       HISTORY OF PRESENTING ILLNESS:  Colton Bush 60 y.o. male is here because of His newly diagnosed pancreatic cancer. He is accompanied by his wife to our multidisciplinary GI clinic today.  He noticed fatigue, anorexia and weight loss about 50bs in the past 4 months. It started when his endocrinologist changed his diabetic meds. He presented to hospital on 7/30 with jaundice. No pain or nausea. CT scan revealed a heterogeneous pancreatic head mass, highly suspicious for malignancy. Mild biliary and pancreatic duct dilatation, mildly prominent lymph nodes in the upper  abdomen. He underwent ERCP and CBD stent placement, and subsequent upper EUS on 03/09/2016, which showed a 3.5 cm irregular mass in the pancreatic head, the mass involves the portal vein, no suspicious nearby adenopathy.  He has been feeling better after stent placement, with improved appetite and energy level. He feels normal again. No symptoms. BM is good, he has gained about 8lbs back.   CURRENT THERAPY: neoadjuvant chemo with FOLFIRINOX every 2 weeks, started on 03/28/2016  INTERIM HISTORY: MR. Edinger returns for follow up and discuss restaging CT scan. He tolerated the last cycle chemotherapy well, with moderate fatigue, recovered well, no significant other side effects. 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 He plans to work 5 days a week.   MEDICAL HISTORY:  Past Medical History:  Diagnosis Date  . Arthritis    left hand  . Bronchitis 1977  . Cancer [facility])    pancreatic cancer  . Depression    takes Cymbalta daily  . Diabetes mellitus type II    sees [name]   . GERD (gastroesophageal reflux disease)    takes Omeprazole daily  . H/O hiatal hernia   . Hyperlipidemia    takes Zocor daily  . Hypertension    takes Amlodipine daily  . Neck pain    C4-7 stenosis and herniated disc  . Neuromuscular disorder ([place])    hiatal hernia  . Scoliosis    slight  . Spinal cord injury, C5-C7 ([place])  c4-c7  . Stiffness of hand joint    d/t cervical issues    SURGICAL HISTORY: Past Surgical History:  Procedure Laterality Date  . ANTERIOR CERVICAL DECOMP/DISCECTOMY FUSION  08/18/2011   Procedure: ANTERIOR CERVICAL DECOMPRESSION/DISCECTOMY FUSION 3 LEVELS;  Surgeon: Winfield Cunas, MD;  Location: [place] [facility]URO ORS;  Service: Neurosurgery;  Laterality: N/A;  Anterior Cervical Four-Five/Five-Six/Six-Seven Decompression with Fusion, Plating, and Bonegraft  . CARPAL TUNNEL RELEASE  2013   bilateral, per Dr. Christella Noa   . COLONOSCOPY  10-30-14   per Dr. Olevia Perches, clear, repeat in  10 yrs   . egd with esophageal dilation  9-08   per Dr. Olevia Perches  . ERCP N/A 03/01/2016   Procedure: ENDOSCOPIC RETROGRADE CHOLANGIOPANCREATOGRAPHY (ERCP) with brushings and stent;  Surgeon: Doran Stabler, MD;  Location: [facility] ENDOSCOPY;  Service: Endoscopy;  Laterality: N/A;  . EUS N/A 03/09/2016   Procedure: ESOPHAGEAL ENDOSCOPIC ULTRASOUND (EUS) RADIAL;  Surgeon: Milus Banister, MD;  Location: [facility] ENDOSCOPY;  Service: Endoscopy;  Laterality: N/A;  . lymph nodes biopsy    . melanoma rt calf  1999  . PORTACATH PLACEMENT Left 03/22/2016   Procedure: INSERTION PORT-A-CATH;  Surgeon: Stark Klein, MD;  Location: [facility] ORS;  Service: General;  Laterality: Left;  . SPINE SURGERY    . TONSILLECTOMY     as a child  . ULNAR TUNNEL RELEASE  2013   right arm, per Dr. Christella Noa   . UPPER GASTROINTESTINAL ENDOSCOPY      SOCIAL HISTORY: Social History   Social History  . Marital status: Married    Spouse name: Colton Bush  . Number of children: N/A  . Years of education: N/A   Occupational History  . Driver    Social History Main Topics  . Smoking status: Never Smoker  . Smokeless tobacco: Never Used     Comment: tried a pipe 35 years ago   . Alcohol use No  . Drug use: No  . Sexual activity: Yes   Other Topics Concern  . Not on file   Social History Narrative   Married, wife Rosaria Ferries Nutritional therapist-   He works as a Geophysicist/field seismologist for a company  He has one son 64 yo. Lives with his wife   FAMILY HISTORY: Family History  Problem Relation Age of Onset  . Heart disease Father   . Heart disease Brother 81  . Anesthesia problems Mother   . Heart disease Mother   . Dementia Mother   . Diabetes Sister   . Stroke Sister   . Colon cancer Neg Hx   . Rectal cancer Neg Hx   . Stomach cancer Neg Hx     ALLERGIES:  has No Known Allergies.  MEDICATIONS:  Current Outpatient Prescriptions  Medication Sig Dispense Refill  . amLODipine (NORVASC) 10 MG tablet TAKE 1 TABLET BY MOUTH EVERY DAY 30  tablet 6  . benazepril (LOTENSIN) 10 MG tablet TAKE 1 TABLET (10 MG TOTAL) BY MOUTH DAILY. 30 tablet 4  . insulin aspart (NOVOLOG) 100 UNIT/ML injection Give every morning with breakfast and every evening with supper per the following sliding scale: give 3 units for glucose 150-175, give 6 units for glucose 176-200, give 9 units for glucose 201-250, give 12 units for glucose greater than 250 3 vial 11  . Insulin Detemir (LEVEMIR) 100 UNIT/ML Pen Inject 20 Units into the skin 2 (two) times daily. Inject 20 units into the skin each evening. 30 mL 3  . Insulin Pen  Needle 30G X 5 MM MISC Use one daily with insulin 100 each 2  . Lancets (FREESTYLE) lancets USE AS INSTRUCTED TO CHECK BLOOD SUGAR ONCE A DAY 100 each 1  . lidocaine-prilocaine (EMLA) cream Apply to affected area once (Patient taking differently: Apply 1 application topically as needed (for port). ) 30 g 3  . Multiple Vitamin (MULTIVITAMIN WITH MINERALS) TABS tablet Take 1 tablet by mouth daily.    Marland Kitchen omega-3 acid ethyl esters (LOVAZA) 1 g capsule Take 2 g by mouth daily.    Marland Kitchen omeprazole (PRILOSEC) 20 MG capsule Take 20 mg by mouth daily.    Marland Kitchen JARDIANCE 25 MG TABS tablet TAKE 1 TABLET BY MOUTH EVERY DAY (Patient not taking: Reported on 05/24/2016) 90 tablet 1  . ondansetron (ZOFRAN) 8 MG tablet Take 1 tablet (8 mg total) by mouth 2 (two) times daily as needed for refractory nausea / vomiting. Start on day 3 after chemotherapy. (Patient not taking: Reported on 05/24/2016) 30 tablet 1  . oxyCODONE (OXY IR/ROXICODONE) 5 MG immediate release tablet Take 1-2 tablets (5-10 mg total) by mouth every 4 (four) hours as needed for moderate pain. (Patient not taking: Reported on 05/24/2016) 30 tablet 0  . polyethylene glycol (MIRALAX / GLYCOLAX) packet Take 17 g by mouth daily as needed for mild constipation. (Patient not taking: Reported on 05/24/2016) 14 each 0  . prochlorperazine (COMPAZINE) 10 MG tablet Take 1 tablet (10 mg total) by mouth every 6 (six)  hours as needed (NAUSEA). (Patient not taking: Reported on 05/24/2016) 30 tablet 1   No current facility-administered medications for this visit.     REVIEW OF SYSTEMS:   Constitutional: Denies fevers, chills or abnormal night sweats Eyes: Denies blurriness of vision, double vision or watery eyes Ears, nose, mouth, throat, and face: Denies mucositis or sore throat Respiratory: Denies cough, dyspnea or wheezes Cardiovascular: Denies palpitation, chest discomfort or lower extremity swelling Gastrointestinal:  Denies nausea, heartburn or change in bowel habits Skin: Denies abnormal skin rashes Lymphatics: Denies new lymphadenopathy or easy bruising Neurological:Denies numbness, tingling or new weaknesses Behavioral/Psych: Mood is stable, no new changes  All other systems were reviewed with the patient and are negative.  PHYSICAL EXAMINATION: ECOG PERFORMANCE STATUS: 1  There were no vitals filed for this visit. Filed Weights   05/24/16 0843  Weight: 190 lb 8 oz (86.4 kg)    GENERAL:alert, no distress and comfortable SKIN: skin color, texture, turgor are normal, no rashes or significant lesions EYES: normal, conjunctiva are pink and non-injected, sclera clear OROPHARYNX:no exudate, no erythema and lips, buccal mucosa, and tongue normal  NECK: supple, thyroid normal size, non-tender, without nodularity LYMPH:  no palpable lymphadenopathy in the cervical, axillary or inguinal LUNGS: clear to auscultation and percussion with normal breathing effort HEART: regular rate & rhythm and no murmurs and no lower extremity edema ABDOMEN:abdomen soft, non-tender and normal bowel sounds Musculoskeletal:no cyanosis of digits and no clubbing  PSYCH: alert & oriented x 3 with fluent speech NEURO: no focal motor/sensory deficits  LABORATORY DATA:  I have reviewed the data as listed CBC Latest Ref Rng & Units 05/24/2016 05/09/2016 04/25/2016  WBC 4.0 - 10.3 10e3/uL 12.0(H) 15.5(H) 13.9(H)    Hemoglobin 13.0 - 17.1 g/dL 12.0(L) 12.0(L) 11.7(L)  Hematocrit 38.4 - 49.9 % 36.8(L) 37.2(L) 34.8(L)  Platelets 140 - 400 10e3/uL 248 346 391   CMP Latest Ref Rng & Units 05/24/2016 05/09/2016 04/25/2016  Glucose 70 - 140 mg/dl 152(H) 79 144(H)  BUN 7.0 -  26.0 mg/dL 13.1 14.0 12.8  Creatinine 0.7 - 1.3 mg/dL 0.6(L) 0.6(L) 0.6(L)  Sodium 136 - 145 mEq/L 142 143 141  Potassium 3.5 - 5.1 mEq/L 3.4(L) 3.6 3.8  Chloride 101 - 111 mmol/L - - -  CO2 22 - 29 mEq/L 25 25 25   Calcium 8.4 - 10.4 mg/dL 9.6 9.8 9.7  Total Protein 6.4 - 8.3 g/dL 7.7 7.8 7.7  Total Bilirubin 0.20 - 1.20 mg/dL 0.23 <0.30 0.30  Alkaline Phos 40 - 150 U/L 182(H) 201(H) 201(H)  AST 5 - 34 U/L 20 23 20   ALT 0 - 55 U/L 26 29 32   CA19.9 (0-35 U/ML) 02/28/2016: 1497 03/24/2016: 648 04/25/2016: 599  PATHOLOGY REPORT  Diagnosis 03/09/2016 FINE NEEDLE ASPIRATION, ENDOSCOPIC, PANCREAS (SPECIMEN 1 OF 1 COLLECTED 03/09/16): MALIGNANT CELLS CONSISTENT WITH ADENOCARCINOMA. BACKGROUND NECROTIC DEBRIS AND ACUTE INFLAMMATION.  Diagnosis 03/01/2016 BILE DUCT BRUSHING(SPECIMEN 1 OF 1 COLLECTED 03/01/16): RARE ATYPICAL CELLS, SEE COMMENT.  RADIOGRAPHIC STUDIES: I have personally reviewed the radiological images as listed and agreed with the findings in the report. Ct Chest W Contrast  Result Date: 05/17/2016 CLINICAL DATA:  Pancreatic head adenocarcinoma clinical stage IIB. EXAM: CT CHEST, ABDOMEN, AND PELVIS WITH CONTRAST TECHNIQUE: Multidetector CT imaging of the chest, abdomen and pelvis was performed following the standard protocol during bolus administration of intravenous contrast. CONTRAST:  112mL ISOVUE-300 IOPAMIDOL (ISOVUE-300) INJECTION 61% COMPARISON:  02/27/2016 FINDINGS: CT CHEST FINDINGS Cardiovascular: Left Port-A-Cath tip: SVC. Heart size normal. Coronary atherosclerosis. Mediastinum/Nodes: Unremarkable Lungs/Pleura: Anterior right upper 4 mm pulmonary nodule, image 100/7, no change from 02/27/2016. Minimal scarring in  the lingula and right middle lobe, stable. Musculoskeletal: Lower cervical plate and screw fixator. Dextroconvex lower thoracic scoliosis. CT ABDOMEN PELVIS FINDINGS Hepatobiliary: Intrahepatic biliary dilatation. The mild diffuse gallbladder wall thickening, with sessile focal thickening along the fundus measuring up to 1 cm in thickness as shown on image 53/605. Biliary stent extends from the common hepatic duct into the duodenum. No compelling findings of hepatic metastatic disease. Pancreas: Abnormal dilated dorsal pancreatic duct with abrupt truncation in the pancreatic head where there is indistinct soft tissue fullness and generally enhancement similar to the rest of the pancreatic tissue even on the arterial phase images. Transverse width of the pancreatic head is currently 4.0 cm and previously 3.6 cm. There is faint peripancreatic stranding along the entire length of the pancreas slightly more notable along the pancreatic head. This abnormal stranding extends along the margins of the superior mesenteric vein and to a lesser extent along the proper hepatic artery. Very subtle stranding in the adipose tissues around the celiac trunk. Spleen: Hypodense lesion along the medial spleen 1 cm in diameter, image 73/4, retrospectively stable. Splenic vein patent. Adrenals/Urinary Tract: Unremarkable Stomach/Bowel: Prominent stool throughout the colon favors constipation. Vascular/Lymphatic: As noted above, the peripancreatic edema extends along the superior mesenteric vein and in the vicinity of the confluence of the splenic vein and SMV. There is a trace amount of edema tracking around the celiac trunk, and the proper hepatic vein is more closely associated with the expected location of the pancreatic head mass, such that intact tissue planes between the pancreatic head in the proper hepatic artery are not seen. The proper hepatic artery from the celiac trunk appears to feed both the right and left hepatic lobes.  Portacaval node 2 cm in short axis on image 72/4, essentially stable. Scattered small peripancreatic and celiac chain lymph nodes are present. An index peripancreatic node measures 1 cm in short axis on  image 66/4, previously the same. Mild aortoiliac atherosclerotic calcification. Reproductive: Unremarkable Other: No supplemental non-categorized findings. Musculoskeletal: Degenerative arthropathy of both hips. Sclerotic benign-appearing lesion in the left iliac bone, stable. Anterior wedge compression at L2 with partial interbody fusion at the L1-2 level. Grade 1 degenerative retrolisthesis at L2-3 and L3-4. Lumbar spondylosis and degenerative disc disease cause bilateral foraminal impingement at all levels between L2 and S1. IMPRESSION: 1. Highly indistinct pancreatic head mass, similar enhancement to the surrounding pancreas, but with abrupt truncation of the dorsal pancreatic duct. Biliary stent in place with mild intrahepatic biliary dilatation. Borderline enlarged peripancreatic lymph node an abnormally enlarged portacaval lymph node concerning for local metastatic disease. No liver metastatic lesion identified. There is peripancreatic stranding favoring pancreatitis, but a component of this stranding especially in the vicinity of the pancreatic head could represent local tumor extension adjacent to the duodenum, superior mesenteric vein, and proper hepatic artery, and involvement of the structures is not excluded. 2. 10 mm hypodense lesion along the inferior splenic hilum, stable, nonspecific. 3. Diffuse gallbladder wall thickening is present and cholecystitis is not excluded. There is also sessile mural thickening in the gallbladder along the fundus up to 1 cm, and gallbladder mass is not readily excluded. The 4. Stable 4 mm anterior right upper lobe pulmonary nodule, no change from July. This may warrant observation. 5. Other imaging findings of potential clinical significance: Coronary atherosclerosis.  Thoracolumbar scoliosis. Lower lumbar spondylosis and degenerative disc disease with foraminal impingement at all levels between L2 and S1. Aortoiliac atherosclerotic vascular disease. Electronically Signed   By: Van Clines M.D.   On: 05/17/2016 08:36   Ct Abdomen Pelvis W Contrast  Result Date: 05/17/2016 CLINICAL DATA:  Pancreatic head adenocarcinoma clinical stage IIB. EXAM: CT CHEST, ABDOMEN, AND PELVIS WITH CONTRAST TECHNIQUE: Multidetector CT imaging of the chest, abdomen and pelvis was performed following the standard protocol during bolus administration of intravenous contrast. CONTRAST:  150mL ISOVUE-300 IOPAMIDOL (ISOVUE-300) INJECTION 61% COMPARISON:  02/27/2016 FINDINGS: CT CHEST FINDINGS Cardiovascular: Left Port-A-Cath tip: SVC. Heart size normal. Coronary atherosclerosis. Mediastinum/Nodes: Unremarkable Lungs/Pleura: Anterior right upper 4 mm pulmonary nodule, image 100/7, no change from 02/27/2016. Minimal scarring in the lingula and right middle lobe, stable. Musculoskeletal: Lower cervical plate and screw fixator. Dextroconvex lower thoracic scoliosis. CT ABDOMEN PELVIS FINDINGS Hepatobiliary: Intrahepatic biliary dilatation. The mild diffuse gallbladder wall thickening, with sessile focal thickening along the fundus measuring up to 1 cm in thickness as shown on image 53/605. Biliary stent extends from the common hepatic duct into the duodenum. No compelling findings of hepatic metastatic disease. Pancreas: Abnormal dilated dorsal pancreatic duct with abrupt truncation in the pancreatic head where there is indistinct soft tissue fullness and generally enhancement similar to the rest of the pancreatic tissue even on the arterial phase images. Transverse width of the pancreatic head is currently 4.0 cm and previously 3.6 cm. There is faint peripancreatic stranding along the entire length of the pancreas slightly more notable along the pancreatic head. This abnormal stranding extends  along the margins of the superior mesenteric vein and to a lesser extent along the proper hepatic artery. Very subtle stranding in the adipose tissues around the celiac trunk. Spleen: Hypodense lesion along the medial spleen 1 cm in diameter, image 73/4, retrospectively stable. Splenic vein patent. Adrenals/Urinary Tract: Unremarkable Stomach/Bowel: Prominent stool throughout the colon favors constipation. Vascular/Lymphatic: As noted above, the peripancreatic edema extends along the superior mesenteric vein and in the vicinity of the confluence of the splenic vein and  SMV. There is a trace amount of edema tracking around the celiac trunk, and the proper hepatic vein is more closely associated with the expected location of the pancreatic head mass, such that intact tissue planes between the pancreatic head in the proper hepatic artery are not seen. The proper hepatic artery from the celiac trunk appears to feed both the right and left hepatic lobes. Portacaval node 2 cm in short axis on image 72/4, essentially stable. Scattered small peripancreatic and celiac chain lymph nodes are present. An index peripancreatic node measures 1 cm in short axis on image 66/4, previously the same. Mild aortoiliac atherosclerotic calcification. Reproductive: Unremarkable Other: No supplemental non-categorized findings. Musculoskeletal: Degenerative arthropathy of both hips. Sclerotic benign-appearing lesion in the left iliac bone, stable. Anterior wedge compression at L2 with partial interbody fusion at the L1-2 level. Grade 1 degenerative retrolisthesis at L2-3 and L3-4. Lumbar spondylosis and degenerative disc disease cause bilateral foraminal impingement at all levels between L2 and S1. IMPRESSION: 1. Highly indistinct pancreatic head mass, similar enhancement to the surrounding pancreas, but with abrupt truncation of the dorsal pancreatic duct. Biliary stent in place with mild intrahepatic biliary dilatation. Borderline enlarged  peripancreatic lymph node an abnormally enlarged portacaval lymph node concerning for local metastatic disease. No liver metastatic lesion identified. There is peripancreatic stranding favoring pancreatitis, but a component of this stranding especially in the vicinity of the pancreatic head could represent local tumor extension adjacent to the duodenum, superior mesenteric vein, and proper hepatic artery, and involvement of the structures is not excluded. 2. 10 mm hypodense lesion along the inferior splenic hilum, stable, nonspecific. 3. Diffuse gallbladder wall thickening is present and cholecystitis is not excluded. There is also sessile mural thickening in the gallbladder along the fundus up to 1 cm, and gallbladder mass is not readily excluded. The 4. Stable 4 mm anterior right upper lobe pulmonary nodule, no change from July. This may warrant observation. 5. Other imaging findings of potential clinical significance: Coronary atherosclerosis. Thoracolumbar scoliosis. Lower lumbar spondylosis and degenerative disc disease with foraminal impingement at all levels between L2 and S1. Aortoiliac atherosclerotic vascular disease. Electronically Signed   By: Van Clines M.D.   On: 05/17/2016 08:36   EUS 03/09/2016 Dr. Ardis Hughs  - 3.5cm irregularly shaped, poorly defined mass in the pancreatic head. There is suspicious, nearby adenopathy. The mass is causing pancreatic and bililary duct obstruction (previously stented). The mass involves the portal vein for 79mm (abuttment and loss of usual tissue interface), strongly suggesting invasion. Preliminary cytology results are positive for malignancy (adenocarcinoma), await final report.  ASSESSMENT & PLAN: 60 year old Caucasian male, with past medical history of diabetes and hypertension, presented with epigastric pain, weight loss, and jaundice.  1. Primary pancreatic adenocarcinoma, in pancreas head, cT2N1M0, stage IIB, borderline resectable  -I have  reviewed his CT abdomen and pelvis with contrast, EUS and biopsy findings with patient and his wife in great details. -His case was reviewed in our GI tumor board a few days ago. CT scan and US showed possible portal vein invasion from the pancreatic tumor, this is borderline resectable disease. No evidence of distant metastasis.  -We reviewed the nature history of pancreatic cancer, and the overall survival rate with chemotherapy, surgery, and radiation. Patient and his wife was discouraged by the overall dismal long term survival rate (~20%)  -He was seen by surgeon Dr. Barry Dienes, Whipple surgery was discussed and offered to patient.  -I reviewed the high risk of cancer recurrence after surgery, and the  role of neoadjuvant and adjuvant chemotherapy to reduce the risk of recurrence, and shrink the tumor before surgery. -I discussed with Dr. Barry Dienes, we recommend patient to have neoadjuvant chemotherapy first, and a possible SBRT after chemo, based on his next staging scan findings after chemo.  -He has started neoadjuvant chemotherapy with FOLFIRINOX, tolerated well so far -Way reviewed his restaging CT scan from 05/17/2016 in our GI tumor Board this morning, which showed a stable pancreatic mass, the tumor does not involve the SMA, but possible invading the port vein. Dr. Barry Dienes was not available for the conference, pending her review  -Dr. [name] offered adjuvant radiation, either SBRT or concurrent chemoradiation -His lab results is reviewed, adequate for treatment, I'll proceed to cycle 5 of FOLFIRINOX today, with pending radiation versus surgery.  2. Type 2 DM - he will continue medication and follow up with his primary care physician Dr. Sarajane Jews  -We reviewed that his blood glucose will need to be monitored closely during the chemotherapy, and his medication may need to be adjusted  -His blood glucose has been significantly elevated, fasting in 150-mid 200s, postprandial in 500 range, his insulin has  been adjusted, he is also more compliant with his diabetic diet  -His blood glucose overall has much improved. We again discussed diabetic diet.   3. HTN -He is on amlodipine and benazepril  -His blood pressure is normal today -The discussed chemotherapy may affect his blood pressure, and he is medication may need to be adjusted  Plan -lab reviewed, will proceed cycle 5 FOLFIRINOX today, Neulasta on day 3 -will review his scan with Dr. Barry Dienes -I will call him next week about his next step treatment, likely radiation   All questions were answered. The patient knows to call the clinic with any problems, questions or concerns.  I spent 20 minutes counseling the patient face to face. The total time spent in the appointment was 25 minutes and more than 50% was on counseling.     Truitt Merle, MD 05/24/2016

## 2016-05-24 NOTE — Patient Instructions (Signed)
[facility] Discharge Instructions for Patients Receiving Chemotherapy  Today you received the following chemotherapy agents Oxaliplatin, Camptosar, Leucovorin, Adrucil  To help prevent nausea and vomiting after your treatment, we encourage you to take your nausea medication    If you develop nausea and vomiting that is not controlled by your nausea medication, call the clinic.   BELOW ARE SYMPTOMS THAT SHOULD BE REPORTED IMMEDIATELY:  *FEVER GREATER THAN 100.5 F  *CHILLS WITH OR WITHOUT FEVER  NAUSEA AND VOMITING THAT IS NOT CONTROLLED WITH YOUR NAUSEA MEDICATION  *UNUSUAL SHORTNESS OF BREATH  *UNUSUAL BRUISING OR BLEEDING  TENDERNESS IN MOUTH AND THROAT WITH OR WITHOUT PRESENCE OF ULCERS  *URINARY PROBLEMS  *BOWEL PROBLEMS  UNUSUAL RASH Items with * indicate a potential emergency and should be followed up as soon as possible.  Feel free to call the clinic you have any questions or concerns. The clinic phone number is [phone].  Please show the CHEMO ALERT CARD at check-in to the Emergency Department and triage nurse.   

## 2016-05-25 LAB — CANCER ANTIGEN 19-9: CA 19-9: 368 U/mL — ABNORMAL HIGH (ref 0–35)

## 2016-05-26 ENCOUNTER — Ambulatory Visit (HOSPITAL_BASED_OUTPATIENT_CLINIC_OR_DEPARTMENT_OTHER): Payer: 59

## 2016-05-26 VITALS — BP 112/71 | HR 72 | Temp 97.9°F | Resp 18

## 2016-05-26 DIAGNOSIS — Z5189 Encounter for other specified aftercare: Secondary | ICD-10-CM

## 2016-05-26 DIAGNOSIS — C250 Malignant neoplasm of head of pancreas: Secondary | ICD-10-CM

## 2016-05-26 DIAGNOSIS — Z95828 Presence of other vascular implants and grafts: Secondary | ICD-10-CM

## 2016-05-26 MED ORDER — PEGFILGRASTIM INJECTION 6 MG/0.6ML
6.0000 mg | Freq: Once | SUBCUTANEOUS | Status: AC
  Administered 2016-05-26: 6 mg via SUBCUTANEOUS
  Filled 2016-05-26: qty 0.6

## 2016-05-26 MED ORDER — SODIUM CHLORIDE 0.9 % IJ SOLN
10.0000 mL | INTRAMUSCULAR | Status: DC | PRN
  Filled 2016-05-26: qty 10

## 2016-05-26 MED ORDER — HEPARIN SOD (PORK) LOCK FLUSH 100 UNIT/ML IV SOLN
500.0000 [IU] | Freq: Once | INTRAVENOUS | Status: AC | PRN
  Administered 2016-05-26: 500 [IU]
  Filled 2016-05-26: qty 5

## 2016-05-26 MED ORDER — SODIUM CHLORIDE 0.9% FLUSH
10.0000 mL | INTRAVENOUS | Status: DC | PRN
Start: 2016-05-26 — End: 2016-05-26
  Administered 2016-05-26: 10 mL
  Filled 2016-05-26: qty 10

## 2016-05-26 MED ORDER — HEPARIN SOD (PORK) LOCK FLUSH 100 UNIT/ML IV SOLN
500.0000 [IU] | Freq: Once | INTRAVENOUS | Status: DC | PRN
  Filled 2016-05-26: qty 5

## 2016-05-29 ENCOUNTER — Ambulatory Visit (INDEPENDENT_AMBULATORY_CARE_PROVIDER_SITE_OTHER): Payer: Self-pay | Admitting: Physician Assistant

## 2016-05-29 VITALS — BP 114/74 | HR 99 | Temp 98.4°F | Resp 18 | Ht 70.0 in | Wt 189.6 lb

## 2016-05-29 DIAGNOSIS — Z024 Encounter for examination for driving license: Secondary | ICD-10-CM

## 2016-05-29 NOTE — Patient Instructions (Addendum)
  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   IF you received an x-ray today, you will receive an invoice from [facility]. Please contact [facility] at [phone] with questions or concerns regarding your invoice.   IF you received labwork today, you will receive an invoice from [employer]. Please contact [facility] at [phone] with questions or concerns regarding your invoice.   Our billing staff will not be able to assist you with questions regarding bills from these companies.  You will be contacted with the lab results as soon as they are available. The fastest way to get your results is to activate your My Chart account. Instructions are located on the last page of this paperwork. If you have not heard from [place] regarding the results in 2 weeks, please contact this office.

## 2016-05-29 NOTE — Progress Notes (Signed)
This patient presents for DOT examination for fitness for duty.  Medical History:  no  1. Head/Brain Injuries, disorders or illnesses no  2. Seizures, epilepsy no  3. Eye disorders or impaired vision (except corrective lenses) no  4. Ear disorders, loss of hearing or balance no  5. Heart disease or heart attack, other cardiovascular condition no  6. Heart surgery (valve replacement/bypass, angioplasty, pacemaker/defribrillator) yes  7. High blood pressure no  8. High holesterol no  9. Chronic cough, shortness of breath or other breathing problems no  10. Lung disease (emphysema, asthma or chronic bronchitis) no  11. Kidney disease, dialysis no  12. Digestive problems  yes  13. Diabetes or elevated blood sugar  yes  Insulin use no  14. Nervious or psychiatric disorders, e.g., severe depression no  15. Fainting or syncope no  16. Dizziness, headaches, numbness, tingling or memory loss no  17. Unexplained weight loss no  18. Stroke, TIA or paralysis no  19. Missing or impaired hand, arm, foot, leg, finger, toe no  20. Spinal injury or disease yes  21. Bone, muscles or nerve problems no  22. Blood clots or bleeding bleeding disorders yes  23. Cancer no  24. Chronic infection or other chronic diseases no  25. Sleep disorders, pauses in breathing while asleep, daytime sleepiness, loud snoring no  26. Have you ever had a sleep test? yes  27.  Have you ever spent a night in the hospital? no  28. Have you ever had a broken bone? no  29. Have you or or do you use tobacco products? no  30. Regular, frequent alcohol use no  31. Illegal substance use within the past 2 years no  32.  Have you ever failed a drug test or been dependent on an illegal substance?  Current Medications: Prior to Admission medications   Medication Sig Start Date End Date Taking? Authorizing Provider  amLODipine (NORVASC) 10 MG tablet TAKE 1 TABLET BY MOUTH EVERY DAY 04/27/16  Yes [name], MD  benazepril  (LOTENSIN) 10 MG tablet TAKE 1 TABLET (10 MG TOTAL) BY MOUTH DAILY. 04/28/16  Yes [name], MD  insulin aspart (NOVOLOG) 100 UNIT/ML injection Give every morning with breakfast and every evening with supper per the following sliding scale: give 3 units for glucose 150-175, give 6 units for glucose 176-200, give 9 units for glucose 201-250, give 12 units for glucose greater than 250 03/28/16  Yes [name], MD  Insulin Detemir (LEVEMIR) 100 UNIT/ML Pen Inject 20 Units into the skin 2 (two) times daily. Inject 20 units into the skin each evening. 03/28/16  Yes [name], MD  Insulin Pen Needle 30G X 5 MM MISC Use one daily with insulin 03/04/16  Yes Gay Filler Copland, MD  Lancets (FREESTYLE) lancets USE AS INSTRUCTED TO CHECK BLOOD SUGAR ONCE A DAY 03/07/16  Yes [name], MD  lidocaine-prilocaine (EMLA) cream Apply to affected area once Patient taking differently: Apply 1 application topically as needed (for port).  03/17/16  Yes Truitt Merle, MD  Multiple Vitamin (MULTIVITAMIN WITH MINERALS) TABS tablet Take 1 tablet by mouth daily.   Yes Historical Provider, MD  omega-3 acid ethyl esters (LOVAZA) 1 g capsule Take 2 g by mouth daily.   Yes Historical Provider, MD  omeprazole (PRILOSEC) 20 MG capsule Take 20 mg by mouth daily.   Yes Historical Provider, MD  JARDIANCE 25 MG TABS tablet TAKE 1 TABLET BY MOUTH EVERY DAY Patient not taking:  Reported on 05/29/2016 03/31/16   [name], MD  ondansetron (ZOFRAN) 8 MG tablet Take 1 tablet (8 mg total) by mouth 2 (two) times daily as needed for refractory nausea / vomiting. Start on day 3 after chemotherapy. Patient not taking: Reported on 05/29/2016 03/17/16   Truitt Merle, MD  oxyCODONE (OXY IR/ROXICODONE) 5 MG immediate release tablet Take 1-2 tablets (5-10 mg total) by mouth every 4 (four) hours as needed for moderate pain. Patient not taking: Reported on 05/29/2016 03/22/16   Stark Klein, MD  polyethylene glycol (MIRALAX / GLYCOLAX) packet Take 17 g by mouth  daily as needed for mild constipation. Patient not taking: Reported on 05/29/2016 03/02/16   Kelvin Cellar, MD  prochlorperazine (COMPAZINE) 10 MG tablet Take 1 tablet (10 mg total) by mouth every 6 (six) hours as needed (NAUSEA). Patient not taking: Reported on 05/29/2016 03/17/16   Truitt Merle, MD    Medical Examiner's Comments on Health History:  The patient has diabetes type 2, and adenocarcinoma of the head of the pancreas. He has a history of DKA. He is currently receiving chemotherapy and anticipates surgery in the winter. In the meantime, his diabetes requires insulin.  In addition, he has a history of cervical disc disease with myelopathy, following an accident at work, and reports that he has regained all the function of the LEFT arm/hand.  TESTING:   Visual Acuity Screening   Right eye Left eye Both eyes  Without correction: 20/20 20/13-1 20/15-1  With correction:     Comments: Peripheral Vision: Right eye 85 degrees. Left eye 85 degrees.  The patient can distinguish the colors red, amber and green.  Hearing Screening Comments: The patient was able to hear a forced whisper from 10 feet.  Monocular Vision: No.  Hearing Aid used for test: No. Hearing Aid required to to meet standard: No.  BP 114/74   Pulse 99   Temp 98.4 F (36.9 C) (Oral)   Resp 18   Ht 5\' 10"  (1.778 m)   Wt 189 lb 9.6 oz (86 kg)   SpO2 97%   BMI 27.20 kg/m  Pulse rate is regular  Comments: SP GR: 1.020 PRO: NEG GLU: 200MG  BLO: NEG  PHYSICAL EXAMINATION:  1. Normal General Appearance: Marked overweight, tremor, signs of alcoholism, problem drinking or drug abuse. 2. Normal Skin Exam - tattoos, scars 3. Normal Eyes: pupillary equality, reaction to light, accommodation, ocular motility, ocular muscle imbalance, extra ocular movement, nystagmus, exopthalmos. Ask about retinopathy, cataracts, aphakia, glaucoma, macular degeneration and refer to a specialist if appropriate.  4. Normal Ears: Scarring  of tympanic membrane, occlusion of external canal, perforated eardrums.     5. Normal Mouth and Throat: Irremedial deformities likely to interfere with breathing or swallowing.    6. Normal Heart: Murmurs, extra sounds, enlarged heart, pacemaker, implantable defibrillator.     7. Normal Lungs and Chest, not including breast examination: Abnormal Chest wall expansion, abnormal respiratory rate, abnormal breath sounds including wheezes or alveolar rates, impaired respiratory function, cyanosis. Abnormal findings on physical exam may require further testing such as pulmonary tests and/or x ray of chest.  8. Normal Abdomen and Viscera: Enlarged liver, enlarged spleen, masses, bruits, hernia, significant abdominal wall muscle weakness.  9. Normal Genitourinary System: Hernia  10. Normal Spine, other musculoskeletal: Previous surgery, deformities, limitation of motion, tenderness. 11. Normal Extremities-Limb impaired: Loss or impairment of leg, foot, toe, arm, hand, finger. Perceptible limp, deformities, atrophy, weakness, paralysis, clubbing, edema, hypotonia. Insufficient grasp and prehension to  maintain steering wheel grip. Insufficient mobility and strength in lower limb to operate pedals properly. 12. Normal Neurological: Impaired equilibrium, coordination or speech pattern; paresthesia, asymmetric deep tendon reflexes, sensory or positional abnormalities, abnormal patellar and Babinski's reflexes 13. Normal Gait - antalgic, ataxia  14. Normal Vascular System: Abnormal pulse and amplitude, carotid or arterial bruits, varicose veins.   Does not meet standards. Certification Status: does not meet standards for 2 year certificate. Temporarily disqualified due to : insulin use   Wearing corrective lenses: no Wearing hearing aid: no

## 2016-06-02 ENCOUNTER — Other Ambulatory Visit: Payer: Self-pay

## 2016-06-02 MED ORDER — FREESTYLE LANCETS MISC: 2 refills | Status: DC

## 2016-06-05 ENCOUNTER — Telehealth: Payer: Self-pay | Admitting: *Deleted

## 2016-06-05 NOTE — Telephone Encounter (Signed)
Oncology Nurse Navigator Documentation  Oncology Nurse Navigator Flowsheets 06/05/2016  Navigator Location [facility]  Referral date to RadOnc/MedOnc -  Navigator Encounter Type Telephone--called to inquire as to decision regarding surgery vs. RT  Telephone Incoming Call;Patient Update--informed him that Dr. Barry Dienes wants to look at his scan w/the radiologist. His case will be presented on 11/8 and navigator will call him with the decision/recommendations.  Abnormal Finding Date -  Confirmed Diagnosis Date -  Treatment Initiated Date -  Patient Visit Type -  Treatment Phase Active Tx  Barriers/Navigation Needs Coordination of Care  Education -  Interventions Coordination of Care--added radiology case to 11/8 tumor board discussion  Coordination of Care -  Education Method -  Support Groups/Services -  Acuity -  Time Spent with Patient 15

## 2016-06-07 ENCOUNTER — Telehealth: Payer: Self-pay | Admitting: *Deleted

## 2016-06-07 NOTE — Telephone Encounter (Signed)
Oncology Nurse Navigator Documentation  Oncology Nurse Navigator Flowsheets 06/07/2016  Navigator Location [facility]  Referral date to RadOnc/MedOnc -  Navigator Encounter Type Telephone  Telephone Outgoing Call;Patient Update  Abnormal Finding Date -  Confirmed Diagnosis Date -  Treatment Initiated Date -  Patient Visit Type -  Treatment Phase -  Barriers/Navigation Needs -  Education -  Interventions -  Coordination of Care -Notified of tumor board discussion and looks more resectable now. [facility] will be calling to get him in to see Dr. Barry Dienes early next week. Provided him the phone # for [facility] if he wishes to call them.  Education Method -  Support Groups/Services -  Acuity -  Time Spent with Patient -

## 2016-06-08 ENCOUNTER — Other Ambulatory Visit: Payer: Self-pay | Admitting: Hematology

## 2016-06-08 ENCOUNTER — Other Ambulatory Visit: Payer: Self-pay

## 2016-06-08 ENCOUNTER — Telehealth: Payer: Self-pay

## 2016-06-08 MED ORDER — INSULIN DETEMIR 100 UNIT/ML FLEXPEN: 20.0000 [IU] | PEN_INJECTOR | Freq: Two times a day (BID) | SUBCUTANEOUS | 3 refills | Status: DC

## 2016-06-08 NOTE — Telephone Encounter (Signed)
Pt's Levemir rx has the following sig:  Sig: Inject 20 Units into the skin 2 (two) times daily. Inject 20 units into the skin each evening.  Dr. Sarajane Jews - Please clarify. Thanks!

## 2016-06-08 NOTE — Telephone Encounter (Signed)
The Levemir should be given TWICE daily  20 units

## 2016-06-12 ENCOUNTER — Other Ambulatory Visit: Payer: Self-pay | Admitting: General Surgery

## 2016-06-13 ENCOUNTER — Encounter: Payer: Self-pay | Admitting: Radiation Oncology

## 2016-06-13 NOTE — Progress Notes (Signed)
GI Location of Tumor / Histology:  Pancreas  Colton Bush presented  months ago with symptoms of: fatigue,anorexia,weight loss  About 50 lbs in 4 months,    Biopsies of  (if applicable) revealed: Diagnosis 8/10/217: FINE NEEDLE ASPIRATION, ENDOSCOPIC, PANCREAS (SPECIMEN 1 OF 1 COLLECTED 03/09/16): MALIGNANT CELLS CONSISTENT WITH ADENOCARCINOMA. BACKGROUND NECROTIC DEBRIS AND ACUTE INFLAMMATION  Past/Anticipated interventions by surgeon, if any: 03/09/16 Upper EUS ,Dr. Owens Loffler, MD, Dr. Ival Bible seen and offered whipple procedure will do after  radaition he believes 08/07/2016 ( ERCP and CBD stent placement 03/01/16 Dr. Wilfrid Lund III,MD)  Past/Anticipated interventions by medical oncology, if any: Dr. [name], [place],MD 05/24/16:  03/28/16- Chemotherapy neoadjuvant chemo with FOLFIRINIX every 2 weeks, cycle 5  05/24/16, Neulasta on day 3,    appt 06/20/16 with Dr. [name]  Weight changes, if any:  50 lb weight loss in 4 months  Bowel/Bladder complaints, if any: regular bopwels and bladder no issues  Nausea / Vomiting, if any: No Pain issues, if any:  No  Any blood per rectum:   NO  SAFETY ISSUES: NO  Prior radiation? NO  Pacemaker/ICD? NO  Is the patient on methotrexate? NO  Current Complaints/Details:Married, no tobacco products, except  tried pipe 35 years ago,no alcohol or drug use, Hx Melanoma right calf 1999 Heart issues Mother and father, brother died 2nd MI, sister 2nd MI 4.5 years ago and then stroke,  BP 136/81 (BP Location: Left Arm, Patient Position: Sitting, Cuff Size: Normal)   Pulse 72   Temp 98.3 F (36.8 C) (Oral)   Resp 20   Ht 5\' 10"  (1.778 m)   Wt 195 lb 3.2 oz (88.5 kg)   BMI 28.01 kg/m   Wt Readings from Last 3 Encounters:  06/14/16 195 lb 3.2 oz (88.5 kg)  05/29/16 189 lb 9.6 oz (86 kg)  05/24/16 190 lb 8 oz (86.4 kg)

## 2016-06-14 ENCOUNTER — Encounter: Payer: Self-pay | Admitting: Radiation Oncology

## 2016-06-14 ENCOUNTER — Ambulatory Visit
Admission: RE | Admit: 2016-06-14 | Discharge: 2016-06-14 | Disposition: A | Discharge status: Home / Self Care | Payer: 59 | Source: Ambulatory Visit | Attending: Radiation Oncology | Admitting: Radiation Oncology

## 2016-06-14 DIAGNOSIS — I2510 Atherosclerotic heart disease of native coronary artery without angina pectoris: Secondary | ICD-10-CM | POA: Insufficient documentation

## 2016-06-14 DIAGNOSIS — K219 Gastro-esophageal reflux disease without esophagitis: Secondary | ICD-10-CM | POA: Diagnosis not present

## 2016-06-14 DIAGNOSIS — C250 Malignant neoplasm of head of pancreas: Secondary | ICD-10-CM

## 2016-06-14 DIAGNOSIS — F329 Major depressive disorder, single episode, unspecified: Secondary | ICD-10-CM | POA: Diagnosis not present

## 2016-06-14 DIAGNOSIS — E119 Type 2 diabetes mellitus without complications: Secondary | ICD-10-CM | POA: Insufficient documentation

## 2016-06-14 DIAGNOSIS — Z794 Long term (current) use of insulin: Secondary | ICD-10-CM | POA: Insufficient documentation

## 2016-06-14 DIAGNOSIS — R911 Solitary pulmonary nodule: Secondary | ICD-10-CM | POA: Insufficient documentation

## 2016-06-14 DIAGNOSIS — E785 Hyperlipidemia, unspecified: Secondary | ICD-10-CM | POA: Diagnosis not present

## 2016-06-14 DIAGNOSIS — I10 Essential (primary) hypertension: Secondary | ICD-10-CM | POA: Insufficient documentation

## 2016-06-14 DIAGNOSIS — M47816 Spondylosis without myelopathy or radiculopathy, lumbar region: Secondary | ICD-10-CM | POA: Insufficient documentation

## 2016-06-14 DIAGNOSIS — M4185 Other forms of scoliosis, thoracolumbar region: Secondary | ICD-10-CM | POA: Diagnosis not present

## 2016-06-14 DIAGNOSIS — M5136 Other intervertebral disc degeneration, lumbar region: Secondary | ICD-10-CM | POA: Diagnosis not present

## 2016-06-14 NOTE — Progress Notes (Addendum)
Radiation Oncology         [phone] ________________________________  Name: Colton Bush MRN: [id number]  Date: 06/14/2016  DOB: 19-Jul-1956  CC:Colton A, MD  Truitt Merle, MD     REFERRING PHYSICIAN: Truitt Merle, MD   DIAGNOSIS: The encounter diagnosis was Adenocarcinoma of head of pancreas ([place]).   HISTORY OF PRESENT ILLNESS::Colton Bush is Bush 60 y.o. male who is seen for an initial consultation visit. His diagnostic biopsy of the pancreas on 03/09/16 revealed malignant cells consistent with adenocarcinoma, as well as background necrotic debris and acute inflammation. He reports experiencing fatigue, as well as significant weight loss of 50 lbs in 4 months. The patient denies any bowel or bladder issues. Denies vomiting. Denies pain. Patient reports he is "doing fine."  The patient's case was initially discussed in multidisciplinary GI conference and he has proceeded with Bush course of neoadjuvant chemotherapy. The patient originally was felt to have locally advanced disease which was potentially resectable. No sign of metastatic disease. Restaging studies have been completed on 05/16/2016. The scan has been once again reviewed in multidisciplinary GI conference. Within the pancreatic head region, Bush highly indistinct mass was seen. Borderline enlarged. Pancreatic lymph node was also seen without any liver metastasis. This was felt to once again represent borderline resectable disease. We did discuss additional treatment with radiation treatment pre-surgery however and the patient presents today for consideration of this.   PREVIOUS RADIATION THERAPY: No   PAST MEDICAL HISTORY:  has Bush past medical history of Arthritis; Bronchitis (1977); Cancer ([place]) (03/09/2016); Depression; Diabetes mellitus type II; GERD (gastroesophageal reflux disease); H/O hiatal hernia; Hyperlipidemia; Hypertension; Neck pain; Neuromuscular disorder ([place]); Scoliosis; Spinal cord injury, C5-C7 ([place]); and Stiffness  of hand joint.     PAST SURGICAL HISTORY: Past Surgical History:  Procedure Laterality Date  . ANTERIOR CERVICAL DECOMP/DISCECTOMY FUSION  08/18/2011   Procedure: ANTERIOR CERVICAL DECOMPRESSION/DISCECTOMY FUSION 3 LEVELS;  Surgeon: Winfield Cunas, MD;  Location: [facility];  Service: Neurosurgery;  Laterality: N/Bush;  Anterior Cervical Four-Five/Five-Six/Six-Seven Decompression with Fusion, Plating, and Bonegraft  . CARPAL TUNNEL RELEASE  2013   bilateral, per Dr. Christella Noa   . COLONOSCOPY  10-30-14   per Dr. Olevia Perches, clear, repeat in 10 yrs   . egd with esophageal dilation  9-08   per Dr. Olevia Perches  . ERCP N/Bush 03/01/2016   Procedure: ENDOSCOPIC RETROGRADE CHOLANGIOPANCREATOGRAPHY (ERCP) with brushings and stent;  Surgeon: Doran Stabler, MD;  Location: [facility];  Service: Endoscopy;  Laterality: N/Bush;  . EUS N/Bush 03/09/2016   Procedure: ESOPHAGEAL ENDOSCOPIC ULTRASOUND (EUS) RADIAL;  Surgeon: Milus Banister, MD;  Location: [facility];  Service: Endoscopy;  Laterality: N/Bush;  . lymph nodes biopsy    . melanoma rt calf  1999  . PORTACATH PLACEMENT Left 03/22/2016   Procedure: INSERTION PORT-Bush-CATH;  Surgeon: Stark Klein, MD;  Location: [facility];  Service: General;  Laterality: Left;  . SPINE SURGERY    . TONSILLECTOMY     as Bush child  . ULNAR TUNNEL RELEASE  2013   right arm, per Dr. Christella Noa   . UPPER GASTROINTESTINAL ENDOSCOPY       FAMILY HISTORY: family history includes Anesthesia problems in his mother; Dementia in his mother; Diabetes in his sister; Heart disease in his father and mother; Heart disease (age of onset: 23) in his brother; Stroke in his sister.   SOCIAL HISTORY:  reports that he has never smoked. He has never used smokeless tobacco. He  reports that he does not drink alcohol or use drugs.   ALLERGIES: Patient has no known allergies.   MEDICATIONS:  Current Outpatient Prescriptions  Medication Sig Dispense Refill  . amLODipine (NORVASC) 10 MG tablet TAKE 1 TABLET  BY MOUTH EVERY DAY 30 tablet 6  . benazepril (LOTENSIN) 10 MG tablet TAKE 1 TABLET (10 MG TOTAL) BY MOUTH DAILY. 30 tablet 4  . insulin aspart (NOVOLOG) 100 UNIT/ML injection Give every morning with breakfast and every evening with supper per the following sliding scale: give 3 units for glucose 150-175, give 6 units for glucose 176-200, give 9 units for glucose 201-250, give 12 units for glucose greater than 250 3 vial 11  . Insulin Detemir (LEVEMIR) 100 UNIT/ML Pen Inject 20 Units into the skin 2 (two) times daily. 90 mL 3  . Insulin Pen Needle 30G X 5 MM MISC Use one daily with insulin 100 each 2  . Lancets (FREESTYLE) lancets USE AS INSTRUCTED TO CHECK BLOOD SUGAR ONCE Bush DAY 100 each 2  . Multiple Vitamin (MULTIVITAMIN WITH MINERALS) TABS tablet Take 1 tablet by mouth daily.    Marland Kitchen omega-3 acid ethyl esters (LOVAZA) 1 g capsule Take 2 g by mouth daily.    Marland Kitchen omeprazole (PRILOSEC) 20 MG capsule Take 20 mg by mouth daily.    Marland Kitchen JARDIANCE 25 MG TABS tablet TAKE 1 TABLET BY MOUTH EVERY DAY (Patient not taking: Reported on 06/14/2016) 90 tablet 1  . lidocaine-prilocaine (EMLA) cream Apply to affected area once (Patient not taking: Reported on 06/14/2016) 30 g 3  . ondansetron (ZOFRAN) 8 MG tablet Take 1 tablet (8 mg total) by mouth 2 (two) times daily as needed for refractory nausea / vomiting. Start on day 3 after chemotherapy. (Patient not taking: Reported on 06/14/2016) 30 tablet 1  . oxyCODONE (OXY IR/ROXICODONE) 5 MG immediate release tablet Take 1-2 tablets (5-10 mg total) by mouth every 4 (four) hours as needed for moderate pain. (Patient not taking: Reported on 06/14/2016) 30 tablet 0  . polyethylene glycol (MIRALAX / GLYCOLAX) packet Take 17 g by mouth daily as needed for mild constipation. (Patient not taking: Reported on 06/14/2016) 14 each 0  . prochlorperazine (COMPAZINE) 10 MG tablet Take 1 tablet (10 mg total) by mouth every 6 (six) hours as needed (NAUSEA). (Patient not taking: Reported on  06/14/2016) 30 tablet 1   No current facility-administered medications for this encounter.      REVIEW OF SYSTEMS:  Bush 15 point review of systems is documented in the electronic medical record. This was obtained by the nursing staff. However, I reviewed this with the patient to discuss relevant findings and make appropriate changes.      PHYSICAL EXAM:  height is 5\' 10"  (1.778 m) and weight is 195 lb 3.2 oz (88.5 kg). His oral temperature is 98.3 F (36.8 C). His blood pressure is 136/81 and his pulse is 72. His respiration is 20.  In general this is Bush well appearing Caucasian gentleman in no acute distress. He's alert and oriented x4 and appropriate throughout the examination. Cardiopulmonary assessment is negative for acute distress and he exhibits normal effort.    ECOG = 1  0 - Asymptomatic (Fully active, able to carry on all predisease activities without restriction)  1 - Symptomatic but completely ambulatory (Restricted in physically strenuous activity but ambulatory and able to carry out work of Bush light or sedentary nature. For example, light housework, office work)  2 - Symptomatic, <50% in bed during  the day (Ambulatory and capable of all self care but unable to carry out any work activities. Up and about more than 50% of waking hours)  3 - Symptomatic, >50% in bed, but not bedbound (Capable of only limited self-care, confined to bed or chair 50% or more of waking hours)  4 - Bedbound (Completely disabled. Cannot carry on any self-care. Totally confined to bed or chair)  5 - Death   Eustace Pen MM, Creech RH, Tormey DC, et al. [phone]). "Toxicity and response criteria of the [facility]". [place] Oncol. 5 (6): 649-55     LABORATORY DATA:  Lab Results  Component Value Date   WBC 12.0 (H) 05/24/2016   HGB 12.0 (L) 05/24/2016   HCT 36.8 (L) 05/24/2016   MCV 89.8 05/24/2016   PLT 248 05/24/2016   Lab Results  Component Value Date   NA 142 05/24/2016     K 3.4 (L) 05/24/2016   CL 102 03/21/2016   CO2 25 05/24/2016   Lab Results  Component Value Date   ALT 26 05/24/2016   AST 20 05/24/2016   ALKPHOS 182 (H) 05/24/2016   BILITOT 0.23 05/24/2016      RADIOGRAPHY: Ct Chest W Contrast  Result Date: 05/17/2016 CLINICAL DATA:  Pancreatic head adenocarcinoma clinical stage IIB. EXAM: CT CHEST, ABDOMEN, AND PELVIS WITH CONTRAST TECHNIQUE: Multidetector CT imaging of the chest, abdomen and pelvis was performed following the standard protocol during bolus administration of intravenous contrast. CONTRAST:  156mL ISOVUE-300 IOPAMIDOL (ISOVUE-300) INJECTION 61% COMPARISON:  02/27/2016 FINDINGS: CT CHEST FINDINGS Cardiovascular: Left Port-Bush-Cath tip: SVC. Heart size normal. Coronary atherosclerosis. Mediastinum/Nodes: Unremarkable Lungs/Pleura: Anterior right upper 4 mm pulmonary nodule, image 100/7, no change from 02/27/2016. Minimal scarring in the lingula and right middle lobe, stable. Musculoskeletal: Lower cervical plate and screw fixator. Dextroconvex lower thoracic scoliosis. CT ABDOMEN PELVIS FINDINGS Hepatobiliary: Intrahepatic biliary dilatation. The mild diffuse gallbladder wall thickening, with sessile focal thickening along the fundus measuring up to 1 cm in thickness as shown on image 53/605. Biliary stent extends from the common hepatic duct into the duodenum. No compelling findings of hepatic metastatic disease. Pancreas: Abnormal dilated dorsal pancreatic duct with abrupt truncation in the pancreatic head where there is indistinct soft tissue fullness and generally enhancement similar to the rest of the pancreatic tissue even on the arterial phase images. Transverse width of the pancreatic head is currently 4.0 cm and previously 3.6 cm. There is faint peripancreatic stranding along the entire length of the pancreas slightly more notable along the pancreatic head. This abnormal stranding extends along the margins of the superior mesenteric vein  and to Bush lesser extent along the proper hepatic artery. Very subtle stranding in the adipose tissues around the celiac trunk. Spleen: Hypodense lesion along the medial spleen 1 cm in diameter, image 73/4, retrospectively stable. Splenic vein patent. Adrenals/Urinary Tract: Unremarkable Stomach/Bowel: Prominent stool throughout the colon favors constipation. Vascular/Lymphatic: As noted above, the peripancreatic edema extends along the superior mesenteric vein and in the vicinity of the confluence of the splenic vein and SMV. There is Bush trace amount of edema tracking around the celiac trunk, and the proper hepatic vein is more closely associated with the expected location of the pancreatic head mass, such that intact tissue planes between the pancreatic head in the proper hepatic artery are not seen. The proper hepatic artery from the celiac trunk appears to feed both the right and left hepatic lobes. Portacaval node 2 cm in short axis on  image 72/4, essentially stable. Scattered small peripancreatic and celiac chain lymph nodes are present. An index peripancreatic node measures 1 cm in short axis on image 66/4, previously the same. Mild aortoiliac atherosclerotic calcification. Reproductive: Unremarkable Other: No supplemental non-categorized findings. Musculoskeletal: Degenerative arthropathy of both hips. Sclerotic benign-appearing lesion in the left iliac bone, stable. Anterior wedge compression at L2 with partial interbody fusion at the L1-2 level. Grade 1 degenerative retrolisthesis at L2-3 and L3-4. Lumbar spondylosis and degenerative disc disease cause bilateral foraminal impingement at all levels between L2 and S1. IMPRESSION: 1. Highly indistinct pancreatic head mass, similar enhancement to the surrounding pancreas, but with abrupt truncation of the dorsal pancreatic duct. Biliary stent in place with mild intrahepatic biliary dilatation. Borderline enlarged peripancreatic lymph node an abnormally enlarged  portacaval lymph node concerning for local metastatic disease. No liver metastatic lesion identified. There is peripancreatic stranding favoring pancreatitis, but Bush component of this stranding especially in the vicinity of the pancreatic head could represent local tumor extension adjacent to the duodenum, superior mesenteric vein, and proper hepatic artery, and involvement of the structures is not excluded. 2. 10 mm hypodense lesion along the inferior splenic hilum, stable, nonspecific. 3. Diffuse gallbladder wall thickening is present and cholecystitis is not excluded. There is also sessile mural thickening in the gallbladder along the fundus up to 1 cm, and gallbladder mass is not readily excluded. The 4. Stable 4 mm anterior right upper lobe pulmonary nodule, no change from July. This may warrant observation. 5. Other imaging findings of potential clinical significance: Coronary atherosclerosis. Thoracolumbar scoliosis. Lower lumbar spondylosis and degenerative disc disease with foraminal impingement at all levels between L2 and S1. Aortoiliac atherosclerotic vascular disease. Electronically Signed   By: Van Clines M.D.   On: 05/17/2016 08:36   Ct Abdomen Pelvis W Contrast  Result Date: 05/17/2016 CLINICAL DATA:  Pancreatic head adenocarcinoma clinical stage IIB. EXAM: CT CHEST, ABDOMEN, AND PELVIS WITH CONTRAST TECHNIQUE: Multidetector CT imaging of the chest, abdomen and pelvis was performed following the standard protocol during bolus administration of intravenous contrast. CONTRAST:  149mL ISOVUE-300 IOPAMIDOL (ISOVUE-300) INJECTION 61% COMPARISON:  02/27/2016 FINDINGS: CT CHEST FINDINGS Cardiovascular: Left Port-Bush-Cath tip: SVC. Heart size normal. Coronary atherosclerosis. Mediastinum/Nodes: Unremarkable Lungs/Pleura: Anterior right upper 4 mm pulmonary nodule, image 100/7, no change from 02/27/2016. Minimal scarring in the lingula and right middle lobe, stable. Musculoskeletal: Lower cervical  plate and screw fixator. Dextroconvex lower thoracic scoliosis. CT ABDOMEN PELVIS FINDINGS Hepatobiliary: Intrahepatic biliary dilatation. The mild diffuse gallbladder wall thickening, with sessile focal thickening along the fundus measuring up to 1 cm in thickness as shown on image 53/605. Biliary stent extends from the common hepatic duct into the duodenum. No compelling findings of hepatic metastatic disease. Pancreas: Abnormal dilated dorsal pancreatic duct with abrupt truncation in the pancreatic head where there is indistinct soft tissue fullness and generally enhancement similar to the rest of the pancreatic tissue even on the arterial phase images. Transverse width of the pancreatic head is currently 4.0 cm and previously 3.6 cm. There is faint peripancreatic stranding along the entire length of the pancreas slightly more notable along the pancreatic head. This abnormal stranding extends along the margins of the superior mesenteric vein and to Bush lesser extent along the proper hepatic artery. Very subtle stranding in the adipose tissues around the celiac trunk. Spleen: Hypodense lesion along the medial spleen 1 cm in diameter, image 73/4, retrospectively stable. Splenic vein patent. Adrenals/Urinary Tract: Unremarkable Stomach/Bowel: Prominent stool throughout the colon favors constipation.  Vascular/Lymphatic: As noted above, the peripancreatic edema extends along the superior mesenteric vein and in the vicinity of the confluence of the splenic vein and SMV. There is Bush trace amount of edema tracking around the celiac trunk, and the proper hepatic vein is more closely associated with the expected location of the pancreatic head mass, such that intact tissue planes between the pancreatic head in the proper hepatic artery are not seen. The proper hepatic artery from the celiac trunk appears to feed both the right and left hepatic lobes. Portacaval node 2 cm in short axis on image 72/4, essentially stable.  Scattered small peripancreatic and celiac chain lymph nodes are present. An index peripancreatic node measures 1 cm in short axis on image 66/4, previously the same. Mild aortoiliac atherosclerotic calcification. Reproductive: Unremarkable Other: No supplemental non-categorized findings. Musculoskeletal: Degenerative arthropathy of both hips. Sclerotic benign-appearing lesion in the left iliac bone, stable. Anterior wedge compression at L2 with partial interbody fusion at the L1-2 level. Grade 1 degenerative retrolisthesis at L2-3 and L3-4. Lumbar spondylosis and degenerative disc disease cause bilateral foraminal impingement at all levels between L2 and S1. IMPRESSION: 1. Highly indistinct pancreatic head mass, similar enhancement to the surrounding pancreas, but with abrupt truncation of the dorsal pancreatic duct. Biliary stent in place with mild intrahepatic biliary dilatation. Borderline enlarged peripancreatic lymph node an abnormally enlarged portacaval lymph node concerning for local metastatic disease. No liver metastatic lesion identified. There is peripancreatic stranding favoring pancreatitis, but Bush component of this stranding especially in the vicinity of the pancreatic head could represent local tumor extension adjacent to the duodenum, superior mesenteric vein, and proper hepatic artery, and involvement of the structures is not excluded. 2. 10 mm hypodense lesion along the inferior splenic hilum, stable, nonspecific. 3. Diffuse gallbladder wall thickening is present and cholecystitis is not excluded. There is also sessile mural thickening in the gallbladder along the fundus up to 1 cm, and gallbladder mass is not readily excluded. The 4. Stable 4 mm anterior right upper lobe pulmonary nodule, no change from July. This may warrant observation. 5. Other imaging findings of potential clinical significance: Coronary atherosclerosis. Thoracolumbar scoliosis. Lower lumbar spondylosis and degenerative disc  disease with foraminal impingement at all levels between L2 and S1. Aortoiliac atherosclerotic vascular disease. Electronically Signed   By: Van Clines M.D.   On: 05/17/2016 08:36       IMPRESSION: This is Bush 60 year old gentleman with an encounter diagnosis of adenocarcinoma of the head of the pancreas ([place]). This patient is Bush good candidate for external beam radiotherapy.  Cancer Staging Adenocarcinoma of head of pancreas [facility]) Staging form: Pancreas, AJCC 7th Edition - Clinical stage from 03/09/2016: Stage IIB (T2, N1, M0) - Signed by Truitt Merle, MD on 03/17/2016   PLAN: The patient is an appropriate candidate for Bush course of neoadjuvant chemoradiotherapy for his diagnosis of pancreatic cancer. This would consist of 5-1/2 weeks of daily radiation treatment and this would be given concurrently with chemotherapy. Given the location and extent of the tumor, I favor this over Bush course of stereotactic body radiation treatment in his case. This can be given with concurrent chemotherapy. The patient therefore will need to proceed with simulation such that we can proceed with treatment planning. The target for radiation would consist of the tumor in addition to any surrounding high risk nodal volumes.  I discussed this plan with the patient, including the rationale for radiotherapy in this setting for local control and to try to  make surgical resection more feasible. We did discuss the possible side effects and risks of treatment including such possible issues as fatigue, nausea/decreased appetite, loose stools/ diarrhea, irritation of the local area or damage to nearby normal structures. All of the patient's questions were answered.  At the end of this discussion, the patient indicated that he wished to proceed with this treatment recommendation that was outlined for him. Therefore, he will be scheduled for Bush simulation and we will begin the treatment planning process.      ________________________________   Jodelle Gross, MD, PhD  This document serves as Bush record of services personally performed by Kyung Rudd, MD and Shona Simpson, PA. It was created on his behalf by Maryla Morrow, Bush trained medical scribe. The creation of this record is based on the scribe's personal observations and the provider's statements to them. This document has been checked and approved by the attending provider.

## 2016-06-15 NOTE — Progress Notes (Addendum)
Does patient have an allergy to IV contrast dye?: No.   Has patient ever received premedication for IV contrast dye?: No.   Does patient take metformin?: No.  If patient does take metformin when was the last dose: n/a  Date of lab work: May 24, 2016 BUN: 13.1  CR: 0.6  IV site: left chest power= 20g huber needle 1 inch inserted using sterile technique, excellent blood return,flushed with 40ml normal saline,, clamped  and opsite  Placed over left chest , patient tolerated well BP 129/77 (BP Location: Right Arm, Patient Position: Sitting)   Pulse 73   Temp 97.7 F (36.5 C) (Oral)   Ht 5\' 10"  (1.778 m)   Wt 197 lb 6.4 oz (89.5 kg)   SpO2 100%   BMI 28.32 kg/m

## 2016-06-16 ENCOUNTER — Other Ambulatory Visit: Payer: Self-pay | Admitting: Family Medicine

## 2016-06-16 ENCOUNTER — Ambulatory Visit
Admission: RE | Admit: 2016-06-16 | Discharge: 2016-06-16 | Disposition: A | Discharge status: Home / Self Care | Payer: 59 | Source: Ambulatory Visit | Attending: Radiation Oncology | Admitting: Radiation Oncology

## 2016-06-16 VITALS — BP 129/77 | HR 73 | Temp 97.7°F | Ht 70.0 in | Wt 197.4 lb

## 2016-06-16 DIAGNOSIS — C250 Malignant neoplasm of head of pancreas: Secondary | ICD-10-CM

## 2016-06-16 MED ORDER — HEPARIN SOD (PORK) LOCK FLUSH 100 UNIT/ML IV SOLN
500.0000 [IU] | Freq: Once | INTRAVENOUS | Status: AC
  Administered 2016-06-16: 500 [IU] via INTRAVENOUS

## 2016-06-16 MED ORDER — SODIUM CHLORIDE 0.9% FLUSH
10.0000 mL | Freq: Once | INTRAVENOUS | Status: AC
  Administered 2016-06-16: 10 mL via INTRAVENOUS

## 2016-06-16 NOTE — Telephone Encounter (Signed)
Refill request for a 90 day supply of Levemir to [facility] on [address].

## 2016-06-20 ENCOUNTER — Other Ambulatory Visit (HOSPITAL_BASED_OUTPATIENT_CLINIC_OR_DEPARTMENT_OTHER): Payer: 59

## 2016-06-20 ENCOUNTER — Ambulatory Visit (HOSPITAL_BASED_OUTPATIENT_CLINIC_OR_DEPARTMENT_OTHER): Payer: 59 | Admitting: Hematology

## 2016-06-20 ENCOUNTER — Telehealth: Payer: Self-pay | Admitting: Hematology

## 2016-06-20 ENCOUNTER — Other Ambulatory Visit: Payer: Self-pay | Admitting: Endocrinology

## 2016-06-20 ENCOUNTER — Encounter: Payer: Self-pay | Admitting: Hematology

## 2016-06-20 VITALS — BP 151/77 | HR 75 | Temp 97.6°F | Resp 18 | Ht 70.0 in | Wt 196.6 lb

## 2016-06-20 DIAGNOSIS — IMO0001 Reserved for inherently not codable concepts without codable children: Secondary | ICD-10-CM

## 2016-06-20 DIAGNOSIS — C250 Malignant neoplasm of head of pancreas: Secondary | ICD-10-CM | POA: Diagnosis not present

## 2016-06-20 DIAGNOSIS — E119 Type 2 diabetes mellitus without complications: Secondary | ICD-10-CM

## 2016-06-20 DIAGNOSIS — E1165 Type 2 diabetes mellitus with hyperglycemia: Principal | ICD-10-CM

## 2016-06-20 DIAGNOSIS — I10 Essential (primary) hypertension: Secondary | ICD-10-CM

## 2016-06-20 DIAGNOSIS — Z794 Long term (current) use of insulin: Principal | ICD-10-CM

## 2016-06-20 LAB — CBC WITH DIFFERENTIAL/PLATELET
BASO%: 1.6 % (ref 0.0–2.0)
Basophils Absolute: 0.1 10*3/uL (ref 0.0–0.1)
EOS%: 1.7 % (ref 0.0–7.0)
Eosinophils Absolute: 0.1 10*3/uL (ref 0.0–0.5)
HCT: 36.8 % — ABNORMAL LOW (ref 38.4–49.9)
HGB: 12 g/dL — ABNORMAL LOW (ref 13.0–17.1)
LYMPH%: 24.5 % (ref 14.0–49.0)
MCH: 28.5 pg (ref 27.2–33.4)
MCHC: 32.6 g/dL (ref 32.0–36.0)
MCV: 87.4 fL (ref 79.3–98.0)
MONO#: 0.7 10*3/uL (ref 0.1–0.9)
MONO%: 8.8 % (ref 0.0–14.0)
NEUT#: 4.9 10*3/uL (ref 1.5–6.5)
NEUT%: 63.4 % (ref 39.0–75.0)
Platelets: 232 10*3/uL (ref 140–400)
RBC: 4.21 10*6/uL (ref 4.20–5.82)
RDW: 16.6 % — ABNORMAL HIGH (ref 11.0–14.6)
WBC: 7.7 10*3/uL (ref 4.0–10.3)
lymph#: 1.9 10*3/uL (ref 0.9–3.3)

## 2016-06-20 LAB — COMPREHENSIVE METABOLIC PANEL
ALT: 33 U/L (ref 0–55)
AST: 28 U/L (ref 5–34)
Albumin: 3.8 g/dL (ref 3.5–5.0)
Alkaline Phosphatase: 104 U/L (ref 40–150)
Anion Gap: 9 mEq/L (ref 3–11)
BUN: 15.5 mg/dL (ref 7.0–26.0)
CO2: 25 mEq/L (ref 22–29)
Calcium: 9.7 mg/dL (ref 8.4–10.4)
Chloride: 108 mEq/L (ref 98–109)
Creatinine: 0.6 mg/dL — ABNORMAL LOW (ref 0.7–1.3)
EGFR: >90 mL/min/{1.73_m2} (ref >90)
Glucose: 76 mg/dl (ref 70–140)
Potassium: 3.2 mEq/L — ABNORMAL LOW (ref 3.5–5.1)
Sodium: 142 mEq/L (ref 136–145)
Total Bilirubin: 0.62 mg/dL (ref 0.20–1.20)
Total Protein: 7.7 g/dL (ref 6.4–8.3)

## 2016-06-20 MED ORDER — CAPECITABINE 500 MG PO TABS: ORAL_TABLET | ORAL | 0 refills | Status: DC

## 2016-06-20 NOTE — Telephone Encounter (Signed)
I called pharmacy they will fill script and notify pt when ready.

## 2016-06-20 NOTE — Progress Notes (Signed)
[place]  [phone]) [phone] [phone]  Clinic Follow up Note   Patient Care Team: [name], MD as PCP - General 06/20/2016  CHIEF COMPLAINTS:  Follow up pancreatic cancer  Oncology History   Adenocarcinoma of head of pancreas [facility])   Staging form: Pancreas, AJCC 7th Edition   - Clinical stage from 03/09/2016: Stage IIB (T2, N1, M0) - Signed by Truitt Merle, MD on 03/17/2016      Adenocarcinoma of head of pancreas ([place])   02/27/2016 Imaging    CT chest, abdomen and pelvis with contrast showed ill-defined heterogeneity of pancreatic head, highly suspicious for malignancy, ) quit about and biliary ductal dilatation, mildly prominent lymph nodes in the upper abdomen, largest 2 cm in the portocaval . No other metastasis.       03/09/2016 Initial Diagnosis    Adenocarcinoma of head of pancreas ([place])      03/09/2016 Procedure    Upper EUS showed a 3.5 cm irregular mass in the pancreatic head, causing pancreatic and biliary duct obstruction. The mass involves the portal vein 422 mm, strongly suggesting invasion. There is suspicious nearby adenopathy      03/09/2016 Initial Biopsy    Fine-needle aspiration of the pancreatic mass from EUS showed malignant cells consistent with adenocarcinoma.       03/28/2016 - 05/24/2016 Chemotherapy    neoadjuvant chemo with FOLFIRINOX every 2 weeks       HISTORY OF PRESENTING ILLNESS:  Colton Bush 60 y.o. male is here because of His newly diagnosed pancreatic cancer. He is accompanied by his wife to our multidisciplinary GI clinic today.  He noticed fatigue, anorexia and weight loss about 50bs in the past 4 months. It started when his endocrinologist changed his diabetic meds. He presented to hospital on 7/30 with jaundice. No pain or nausea. CT scan revealed a heterogeneous pancreatic head mass, highly suspicious for malignancy. Mild biliary and pancreatic duct dilatation, mildly prominent lymph nodes in the  upper abdomen. He underwent ERCP and CBD stent placement, and subsequent upper EUS on 03/09/2016, which showed a 3.5 cm irregular mass in the pancreatic head, the mass involves the portal vein, no suspicious nearby adenopathy.  He has been feeling better after stent placement, with improved appetite and energy level. He feels normal again. No symptoms. BM is good, he has gained about 8lbs back.   CURRENT THERAPY: pending concurrent chemotherapy and irradiation  INTERIM HISTORY: MR. Friddle returns for follow up. She saw Dr. Barry Dienes a few weeks ago, who recommended neoadjuvant radiation. Then he saw Dr. [name] last week, who recommended concurrent chemotherapy and radiation. He is scheduled to start radiation next Monday, for total of 6 weeks. He is doing well overall, denies any significant pain or other symptoms, except mild fatigue. He has been back to work, and tolerating well. He has gained some weight since his last cycle chemotherapy.  MEDICAL HISTORY:  Past Medical History:  Diagnosis Date  . Arthritis    left hand  . Bronchitis 1977  . Cancer ([place]) 03/09/2016   pancreatic cancer  . Depression    takes Cymbalta daily  . Diabetes mellitus type II    sees [name]   . GERD (gastroesophageal reflux disease)    takes Omeprazole daily  . H/O hiatal hernia   . Hyperlipidemia    takes Zocor daily  . Hypertension    takes Amlodipine daily  . Neck pain    C4-7 stenosis and herniated disc  .  Neuromuscular disorder ([place])    hiatal hernia  . Scoliosis    slight  . Spinal cord injury, C5-C7 ([place])    c4-c7  . Stiffness of hand joint    d/t cervical issues    SURGICAL HISTORY: Past Surgical History:  Procedure Laterality Date  . ANTERIOR CERVICAL DECOMP/DISCECTOMY FUSION  08/18/2011   Procedure: ANTERIOR CERVICAL DECOMPRESSION/DISCECTOMY FUSION 3 LEVELS;  Surgeon: Winfield Cunas, MD;  Location: [facility] NEURO ORS;  Service: Neurosurgery;  Laterality: N/A;  Anterior Cervical  Four-Five/Five-Six/Six-Seven Decompression with Fusion, Plating, and Bonegraft  . CARPAL TUNNEL RELEASE  2013   bilateral, per Dr. Christella Noa   . COLONOSCOPY  10-30-14   per Dr. Olevia Perches, clear, repeat in 10 yrs   . egd with esophageal dilation  9-08   per Dr. Olevia Perches  . ERCP N/A 03/01/2016   Procedure: ENDOSCOPIC RETROGRADE CHOLANGIOPANCREATOGRAPHY (ERCP) with brushings and stent;  Surgeon: Doran Stabler, MD;  Location: [facility] ENDOSCOPY;  Service: Endoscopy;  Laterality: N/A;  . EUS N/A 03/09/2016   Procedure: ESOPHAGEAL ENDOSCOPIC ULTRASOUND (EUS) RADIAL;  Surgeon: Milus Banister, MD;  Location: [facility] ENDOSCOPY;  Service: Endoscopy;  Laterality: N/A;  . lymph nodes biopsy    . melanoma rt calf  1999  . PORTACATH PLACEMENT Left 03/22/2016   Procedure: INSERTION PORT-A-CATH;  Surgeon: Stark Klein, MD;  Location: [facility] ORS;  Service: General;  Laterality: Left;  . SPINE SURGERY    . TONSILLECTOMY     as a child  . ULNAR TUNNEL RELEASE  2013   right arm, per Dr. Christella Noa   . UPPER GASTROINTESTINAL ENDOSCOPY      SOCIAL HISTORY: Social History   Social History  . Marital status: Married    Spouse name: Colton Bush  . Number of children: N/A  . Years of education: N/A   Occupational History  . Driver    Social History Main Topics  . Smoking status: Never Smoker  . Smokeless tobacco: Never Used     Comment: tried a pipe 35 years ago   . Alcohol use No  . Drug use: No  . Sexual activity: Yes   Other Topics Concern  . Not on file   Social History Narrative   Married, wife Rosaria Ferries Nutritional therapist-   He works as a Geophysicist/field seismologist for a company  He has one son 43 yo. Lives with his wife   FAMILY HISTORY: Family History  Problem Relation Age of Onset  . Heart disease Father   . Heart disease Brother 6  . Anesthesia problems Mother   . Heart disease Mother   . Dementia Mother   . Diabetes Sister   . Stroke Sister   . Colon cancer Neg Hx   . Rectal cancer Neg Hx   . Stomach cancer Neg Hx      ALLERGIES:  has No Known Allergies.  MEDICATIONS:  Current Outpatient Prescriptions  Medication Sig Dispense Refill  . amLODipine (NORVASC) 10 MG tablet TAKE 1 TABLET BY MOUTH EVERY DAY 30 tablet 6  . benazepril (LOTENSIN) 10 MG tablet TAKE 1 TABLET (10 MG TOTAL) BY MOUTH DAILY. 30 tablet 4  . insulin aspart (NOVOLOG) 100 UNIT/ML injection Give every morning with breakfast and every evening with supper per the following sliding scale: give 3 units for glucose 150-175, give 6 units for glucose 176-200, give 9 units for glucose 201-250, give 12 units for glucose greater than 250 3 vial 11  . Insulin Detemir (LEVEMIR) 100 UNIT/ML Pen  Inject 20 Units into the skin 2 (two) times daily. 90 mL 3  . Insulin Pen Needle 30G X 5 MM MISC Use one daily with insulin 100 each 2  . JARDIANCE 25 MG TABS tablet TAKE 1 TABLET BY MOUTH EVERY DAY 90 tablet 1  . Lancets (FREESTYLE) lancets USE AS INSTRUCTED TO CHECK BLOOD SUGAR ONCE A DAY 100 each 2  . lidocaine-prilocaine (EMLA) cream Apply to affected area once 30 g 3  . Multiple Vitamin (MULTIVITAMIN WITH MINERALS) TABS tablet Take 1 tablet by mouth daily.    Marland Kitchen omega-3 acid ethyl esters (LOVAZA) 1 g capsule Take 2 g by mouth daily.    Marland Kitchen omeprazole (PRILOSEC) 20 MG capsule Take 20 mg by mouth daily.    . ondansetron (ZOFRAN) 8 MG tablet Take 1 tablet (8 mg total) by mouth 2 (two) times daily as needed for refractory nausea / vomiting. Start on day 3 after chemotherapy. 30 tablet 1  . capecitabine (XELODA) 500 MG tablet Take 4 tab in the morning and 3 tab in the evening, on the day of radiation only (Monday through Friday) 210 tablet 0  . oxyCODONE (OXY IR/ROXICODONE) 5 MG immediate release tablet Take 1-2 tablets (5-10 mg total) by mouth every 4 (four) hours as needed for moderate pain. (Patient not taking: Reported on 06/20/2016) 30 tablet 0  . polyethylene glycol (MIRALAX / GLYCOLAX) packet Take 17 g by mouth daily as needed for mild constipation. (Patient  not taking: Reported on 06/20/2016) 14 each 0  . prochlorperazine (COMPAZINE) 10 MG tablet Take 1 tablet (10 mg total) by mouth every 6 (six) hours as needed (NAUSEA). (Patient not taking: Reported on 06/20/2016) 30 tablet 1   No current facility-administered medications for this visit.     REVIEW OF SYSTEMS:   Constitutional: Denies fevers, chills or abnormal night sweats Eyes: Denies blurriness of vision, double vision or watery eyes Ears, nose, mouth, throat, and face: Denies mucositis or sore throat Respiratory: Denies cough, dyspnea or wheezes Cardiovascular: Denies palpitation, chest discomfort or lower extremity swelling Gastrointestinal:  Denies nausea, heartburn or change in bowel habits Skin: Denies abnormal skin rashes Lymphatics: Denies new lymphadenopathy or easy bruising Neurological:Denies numbness, tingling or new weaknesses Behavioral/Psych: Mood is stable, no new changes  All other systems were reviewed with the patient and are negative.  PHYSICAL EXAMINATION: ECOG PERFORMANCE STATUS: 1  Vitals:   06/20/16 0957  BP: (!) 151/77  Pulse: 75  Resp: 18  Temp: 97.6 F (36.4 C)   Filed Weights   06/20/16 0957  Weight: 196 lb 9.6 oz (89.2 kg)    GENERAL:alert, no distress and comfortable SKIN: skin color, texture, turgor are normal, no rashes or significant lesions EYES: normal, conjunctiva are pink and non-injected, sclera clear OROPHARYNX:no exudate, no erythema and lips, buccal mucosa, and tongue normal  NECK: supple, thyroid normal size, non-tender, without nodularity LYMPH:  no palpable lymphadenopathy in the cervical, axillary or inguinal LUNGS: clear to auscultation and percussion with normal breathing effort HEART: regular rate & rhythm and no murmurs and no lower extremity edema ABDOMEN:abdomen soft, non-tender and normal bowel sounds Musculoskeletal:no cyanosis of digits and no clubbing  PSYCH: alert & oriented x 3 with fluent speech NEURO: no focal  motor/sensory deficits  LABORATORY DATA:  I have reviewed the data as listed CBC Latest Ref Rng & Units 06/20/2016 05/24/2016 05/09/2016  WBC 4.0 - 10.3 10e3/uL 7.7 12.0(H) 15.5(H)  Hemoglobin 13.0 - 17.1 g/dL 12.0(L) 12.0(L) 12.0(L)  Hematocrit 38.4 -  49.9 % 36.8(L) 36.8(L) 37.2(L)  Platelets 140 - 400 10e3/uL 232 248 346   CMP Latest Ref Rng & Units 06/20/2016 05/24/2016 05/09/2016  Glucose 70 - 140 mg/dl 76 152(H) 79  BUN 7.0 - 26.0 mg/dL 15.5 13.1 14.0  Creatinine 0.7 - 1.3 mg/dL 0.6(L) 0.6(L) 0.6(L)  Sodium 136 - 145 mEq/L 142 142 143  Potassium 3.5 - 5.1 mEq/L 3.2(L) 3.4(L) 3.6  Chloride 101 - 111 mmol/L - - -  CO2 22 - 29 mEq/L 25 25 25   Calcium 8.4 - 10.4 mg/dL 9.7 9.6 9.8  Total Protein 6.4 - 8.3 g/dL 7.7 7.7 7.8  Total Bilirubin 0.20 - 1.20 mg/dL 0.62 0.23 <0.30  Alkaline Phos 40 - 150 U/L 104 182(H) 201(H)  AST 5 - 34 U/L 28 20 23   ALT 0 - 55 U/L 33 26 29   CA19.9 (0-35 U/ML) 02/28/2016: 1497 03/24/2016: 648 04/25/2016: 599 05/24/2016: 368  PATHOLOGY REPORT  Diagnosis 03/09/2016 FINE NEEDLE ASPIRATION, ENDOSCOPIC, PANCREAS (SPECIMEN 1 OF 1 COLLECTED 03/09/16): MALIGNANT CELLS CONSISTENT WITH ADENOCARCINOMA. BACKGROUND NECROTIC DEBRIS AND ACUTE INFLAMMATION.  Diagnosis 03/01/2016 BILE DUCT BRUSHING(SPECIMEN 1 OF 1 COLLECTED 03/01/16): RARE ATYPICAL CELLS, SEE COMMENT.  RADIOGRAPHIC STUDIES: I have personally reviewed the radiological images as listed and agreed with the findings in the report. No results found. EUS 03/09/2016 Dr. Ardis Hughs  - 3.5cm irregularly shaped, poorly defined mass in the pancreatic head. There is suspicious, nearby adenopathy. The mass is causing pancreatic and bililary duct obstruction (previously stented). The mass involves the portal vein for 43mm (abuttment and loss of usual tissue interface), strongly suggesting invasion. Preliminary cytology results are positive for malignancy (adenocarcinoma), await final report.  ASSESSMENT & PLAN:  60 year old Caucasian male, with past medical history of diabetes and hypertension, presented with epigastric pain, weight loss, and jaundice.  1. Primary pancreatic adenocarcinoma, in pancreas head, cT2N1M0, stage IIB, borderline resectable  -I have reviewed his CT abdomen and pelvis with contrast, EUS and biopsy findings with patient and his wife in great details. -His case was reviewed in our GI tumor board a few days ago. CT scan and US showed possible portal vein invasion from the pancreatic tumor, this is borderline resectable disease. No evidence of distant metastasis.  -We reviewed the nature history of pancreatic cancer, and the overall survival rate with chemotherapy, surgery, and radiation. Patient and his wife was discouraged by the overall dismal long term survival rate (~20%)  -He was seen by surgeon Dr. Barry Dienes, Whipple surgery was discussed and offered to patient.  -I reviewed the high risk of cancer recurrence after surgery, and the role of neoadjuvant and adjuvant chemotherapy to reduce the risk of recurrence, and shrink the tumor before surgery. -He has started neoadjuvant chemotherapy with FOLFIRINOX, tolerated well so far -We reviewed his restaging CT scan from 05/17/2016 in our GI tumor Board this morning, which showed a stable pancreatic mass, the tumor does not involve the SMA, but possible invading the port vein. -Neoadjuvant concurrent chemoradiation were recommended, after 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 with Dr. Precious Bard and Dr. [name]. -I discussed the benefit of concurrent chemotherapy with radiation, and potential side effects, he agrees to proceed. -I discussed the option of Xeloda versus excisional gemcitabine with concurrent radiation. I recommend Xeloda if his insurance covers well, I'll send a prescription of Xeloda to specialty pharmacy today. -If he has high co-pay for Xeloda, we'll change to weekly gemcitabine -He is scheduled to start radiation next Monday  2. Type 2 DM - he will  continue medication  and follow up with his primary care physician Dr. Sarajane Jews  -We reviewed that his blood glucose will need to be monitored closely during the chemotherapy, and his medication may need to be adjusted  -His blood glucose has been significantly elevated, fasting in 150-mid 200s, postprandial in 500 range, his insulin has been adjusted, he is also more compliant with his diabetic diet  -His blood glucose overall has much improved. We again discussed diabetic diet.   3. HTN -He is on amlodipine and benazepril  -His blood pressure is normal today -The discussed chemotherapy may affect his blood pressure, and he is medication may need to be adjusted  Plan -He will start concurrent chemoradiation with Xeloda 2000mg  in am, 1500mg  in evening next Monday 11/27 -I sent the Xeloda prescription to our oral pharmacist today -He will have weekly lab when he is on chemotherapy and radiation on Tuesdays -I plan to see him the second week of his chemoradiation for f/u   All questions were answered. The patient knows to call the clinic with any problems, questions or concerns.  I spent 20 minutes counseling the patient face to face. The total time spent in the appointment was 25 minutes and more than 50% was on counseling.     Truitt Merle, MD 06/20/2016

## 2016-06-20 NOTE — Telephone Encounter (Signed)
Appointments scheduled per 06/20/16 los. Appointments scheduled per 06/20/16 los. A copy of the AVS report and appointment schedule, was given to patient, per 06/20/16 los.

## 2016-06-21 ENCOUNTER — Telehealth: Payer: Self-pay | Admitting: Pharmacist

## 2016-06-21 ENCOUNTER — Ambulatory Visit
Admission: RE | Admit: 2016-06-21 | Discharge: 2016-06-21 | Disposition: A | Discharge status: Home / Self Care | Payer: 59 | Source: Ambulatory Visit | Attending: Radiation Oncology | Admitting: Radiation Oncology

## 2016-06-21 LAB — CANCER ANTIGEN 19-9: CA 19-9: 83 U/mL — ABNORMAL HIGH (ref 0–35)

## 2016-06-21 NOTE — Telephone Encounter (Signed)
Oral Chemotherapy Pharmacist Encounter  Received new prescription for Xeloda for patient to take in conjunction with radiation. Start date planned for 06/26/16 Labs from 11/21 reviewed, OK for treatment. Current medication list in Epic assessed, no significant DDIs with Xeloda identified. Possible interaction with Xeloda and Prilosec due to theoretical decrease in Xeloda dissolution with increasing gastric pH. No change to therapy indicated at this time.  Prescription will be sent to [facility] for benefits analysis.  Johny Drilling, PharmD, BCPS 06/21/2016  2:17 PM Oral Chemotherapy Clinic [phone]

## 2016-06-23 NOTE — Telephone Encounter (Signed)
Oral Chemotherapy Pharmacist Encounter  Received notification from [facility] that patient's Xeloda prescription would require specific specialty pharmacy and prior authorization. Prior authorization completed over the phone at [phone] PA was approved, PA# [id number]  Prescription then faxed to [facility] at [phone] Rx insurance ID# [id number] BIN: [id number]  Oral Chemo Clinic will continue to follow for copay issues, start date, initial counseling, and toxicity and adherence management.  Johny Drilling, PharmD, BCPS 06/23/2016  10:17 AM Oral Chemotherapy Clinic [phone]

## 2016-06-26 ENCOUNTER — Ambulatory Visit
Admission: RE | Admit: 2016-06-26 | Discharge: 2016-06-26 | Disposition: A | Discharge status: Home / Self Care | Payer: 59 | Source: Ambulatory Visit | Attending: Radiation Oncology | Admitting: Radiation Oncology

## 2016-06-26 DIAGNOSIS — C250 Malignant neoplasm of head of pancreas: Secondary | ICD-10-CM | POA: Diagnosis not present

## 2016-06-26 NOTE — Telephone Encounter (Signed)
Oncology Nurse Navigator Documentation  Oncology Nurse Navigator Flowsheets 06/26/2016  Navigator Location [facility]  Referral date to RadOnc/MedOnc -  Navigator Encounter Type Telephone  Telephone Outgoing Call;Medication Assistance  Abnormal Finding Date -  Confirmed Diagnosis Date -  Treatment Initiated Date -  Patient Visit Type -  Treatment Phase Active Tx--RT/Xeloda  Barriers/Navigation Needs -  Education -  Interventions Other--provided patient with phone # to call [facility] to arrange for delivery of his Xeloda. PA was completed and script sent on 11/24.  Coordination of Care -  Education Method -  Support Groups/Services -  Acuity Level 1  Time Spent with Patient 15

## 2016-06-27 ENCOUNTER — Other Ambulatory Visit (HOSPITAL_BASED_OUTPATIENT_CLINIC_OR_DEPARTMENT_OTHER): Payer: 59

## 2016-06-27 DIAGNOSIS — C250 Malignant neoplasm of head of pancreas: Secondary | ICD-10-CM

## 2016-06-27 LAB — COMPREHENSIVE METABOLIC PANEL
ALT: 38 U/L (ref 0–55)
AST: 27 U/L (ref 5–34)
Albumin: 3.8 g/dL (ref 3.5–5.0)
Alkaline Phosphatase: 123 U/L (ref 40–150)
Anion Gap: 8 mEq/L (ref 3–11)
BUN: 19.1 mg/dL (ref 7.0–26.0)
CO2: 28 mEq/L (ref 22–29)
Calcium: 9.7 mg/dL (ref 8.4–10.4)
Chloride: 109 mEq/L (ref 98–109)
Creatinine: 0.6 mg/dL — ABNORMAL LOW (ref 0.7–1.3)
EGFR: >90 mL/min/{1.73_m2} (ref >90)
Glucose: 77 mg/dl (ref 70–140)
Potassium: 4 mEq/L (ref 3.5–5.1)
Sodium: 144 mEq/L (ref 136–145)
Total Bilirubin: 0.71 mg/dL (ref 0.20–1.20)
Total Protein: 7.6 g/dL (ref 6.4–8.3)

## 2016-06-27 LAB — CBC WITH DIFFERENTIAL/PLATELET
BASO%: 2.4 % — ABNORMAL HIGH (ref 0.0–2.0)
Basophils Absolute: 0.2 10*3/uL — ABNORMAL HIGH (ref 0.0–0.1)
EOS%: 2.5 % (ref 0.0–7.0)
Eosinophils Absolute: 0.2 10*3/uL (ref 0.0–0.5)
HCT: 39 % (ref 38.4–49.9)
HGB: 12.5 g/dL — ABNORMAL LOW (ref 13.0–17.1)
LYMPH%: 25.8 % (ref 14.0–49.0)
MCH: 28.4 pg (ref 27.2–33.4)
MCHC: 31.9 g/dL — ABNORMAL LOW (ref 32.0–36.0)
MCV: 89 fL (ref 79.3–98.0)
MONO#: 0.7 10*3/uL (ref 0.1–0.9)
MONO%: 10 % (ref 0.0–14.0)
NEUT#: 4.3 10*3/uL (ref 1.5–6.5)
NEUT%: 59.3 % (ref 39.0–75.0)
Platelets: 216 10*3/uL (ref 140–400)
RBC: 4.38 10*6/uL (ref 4.20–5.82)
RDW: 17.7 % — ABNORMAL HIGH (ref 11.0–14.6)
WBC: 7.2 10*3/uL (ref 4.0–10.3)
lymph#: 1.9 10*3/uL (ref 0.9–3.3)

## 2016-06-28 ENCOUNTER — Ambulatory Visit
Admission: RE | Admit: 2016-06-28 | Discharge: 2016-06-28 | Disposition: A | Discharge status: Home / Self Care | Payer: 59 | Source: Ambulatory Visit | Attending: Radiation Oncology | Admitting: Radiation Oncology

## 2016-06-28 ENCOUNTER — Telehealth: Payer: Self-pay

## 2016-06-28 ENCOUNTER — Encounter: Payer: Self-pay | Admitting: Radiation Oncology

## 2016-06-28 DIAGNOSIS — C250 Malignant neoplasm of head of pancreas: Secondary | ICD-10-CM | POA: Diagnosis not present

## 2016-06-28 MED ORDER — RADIAPLEXRX EX GEL
Freq: Once | CUTANEOUS | Status: AC
  Administered 2016-06-28: 13:00:00 via TOPICAL

## 2016-06-28 NOTE — Progress Notes (Addendum)
Patient education done, for pancreas,  my business card, radiaplex gel and Radiation therapy and you book given to patient, discussed ways to manage side effects, Skin irritation to abdomen,  Nausea and vomiting, diarrhea, may need to eat 5-6 smaller soft bland foods, low in fiber for nausea and diarrhea, no fresh vegetables or fresh fruit , may need to take imodium ad prn, drink high calories and increased protein in diet, may need to have IVF"S from dehydration, fatigue, , use radiaplex daily after rad txs, nothing 4 hours prior to the treatment field;, sees MD weekly and prn, teach back given,  1:20 PM

## 2016-06-28 NOTE — Telephone Encounter (Signed)
Contacted [facility] to check on status of Xeloda (capecitabine) prescription.  Per phone information prescription was shipped on 06/27/16.  Patient should have prescription within 1-2 days of shipping.  Reminder will be placed to follow up with patient for confirmation and questions.  Thank you  Henreitta Leber, PharmD Oral Oncology Navigation Clinic

## 2016-06-29 ENCOUNTER — Ambulatory Visit
Admission: RE | Admit: 2016-06-29 | Discharge: 2016-06-29 | Disposition: A | Discharge status: Home / Self Care | Payer: 59 | Source: Ambulatory Visit | Attending: Radiation Oncology | Admitting: Radiation Oncology

## 2016-06-29 DIAGNOSIS — C250 Malignant neoplasm of head of pancreas: Secondary | ICD-10-CM | POA: Diagnosis not present

## 2016-06-30 ENCOUNTER — Ambulatory Visit
Admission: RE | Admit: 2016-06-30 | Discharge: 2016-06-30 | Disposition: A | Discharge status: Home / Self Care | Payer: 59 | Source: Ambulatory Visit | Attending: Radiation Oncology | Admitting: Radiation Oncology

## 2016-06-30 DIAGNOSIS — C250 Malignant neoplasm of head of pancreas: Secondary | ICD-10-CM | POA: Diagnosis not present

## 2016-07-03 ENCOUNTER — Ambulatory Visit
Admission: RE | Admit: 2016-07-03 | Discharge: 2016-07-03 | Disposition: A | Discharge status: Home / Self Care | Payer: 59 | Source: Ambulatory Visit | Attending: Radiation Oncology | Admitting: Radiation Oncology

## 2016-07-03 ENCOUNTER — Encounter: Payer: Self-pay | Admitting: Radiation Oncology

## 2016-07-03 VITALS — BP 127/69 | HR 74 | Temp 98.2°F | Resp 20 | Wt 198.0 lb

## 2016-07-03 DIAGNOSIS — C250 Malignant neoplasm of head of pancreas: Secondary | ICD-10-CM

## 2016-07-03 NOTE — Progress Notes (Signed)
Department of Radiation Oncology  Phone:  [phone] Fax:        [phone]  Weekly Treatment Note    Name: Colton Bush Date: 07/03/2016 MRN: [id number] DOB: [birth year]   Diagnosis:     ICD-9-CM ICD-10-CM   1. Adenocarcinoma of head of pancreas ([place]) 157.0 C25.0      Current dose: 7.2 Gy  Current fraction:4   MEDICATIONS: Current Outpatient Prescriptions  Medication Sig Dispense Refill  . amLODipine (NORVASC) 10 MG tablet TAKE 1 TABLET BY MOUTH EVERY DAY 30 tablet 6  . benazepril (LOTENSIN) 10 MG tablet TAKE 1 TABLET (10 MG TOTAL) BY MOUTH DAILY. 30 tablet 4  . capecitabine (XELODA) 500 MG tablet Take 4 tab in the morning and 3 tab in the evening, on the day of radiation only (Monday through Friday) 210 tablet 0  . FREESTYLE LITE test strip USE ONE STRIP TO CHECK GLUCOSE ONCE DAILY 25 each 0  . hyaluronate sodium (RADIAPLEXRX) GEL Apply 1 application topically daily.    . insulin aspart (NOVOLOG) 100 UNIT/ML injection Give every morning with breakfast and every evening with supper per the following sliding scale: give 3 units for glucose 150-175, give 6 units for glucose 176-200, give 9 units for glucose 201-250, give 12 units for glucose greater than 250 3 vial 11  . Insulin Detemir (LEVEMIR) 100 UNIT/ML Pen Inject 20 Units into the skin 2 (two) times daily. 90 mL 3  . Insulin Pen Needle 30G X 5 MM MISC Use one daily with insulin 100 each 2  . JARDIANCE 25 MG TABS tablet TAKE 1 TABLET BY MOUTH EVERY DAY 90 tablet 1  . Lancets (FREESTYLE) lancets USE AS INSTRUCTED TO CHECK BLOOD SUGAR ONCE A DAY 100 each 2  . lidocaine-prilocaine (EMLA) cream Apply to affected area once 30 g 3  . Multiple Vitamin (MULTIVITAMIN WITH MINERALS) TABS tablet Take 1 tablet by mouth daily.    Marland Kitchen omega-3 acid ethyl esters (LOVAZA) 1 g capsule Take 2 g by mouth daily.    Marland Kitchen omeprazole (PRILOSEC) 20 MG capsule Take 20 mg by mouth daily.    . ondansetron (ZOFRAN) 8 MG tablet Take 1 tablet (8 mg  total) by mouth 2 (two) times daily as needed for refractory nausea / vomiting. Start on day 3 after chemotherapy. 30 tablet 1  . oxyCODONE (OXY IR/ROXICODONE) 5 MG immediate release tablet Take 1-2 tablets (5-10 mg total) by mouth every 4 (four) hours as needed for moderate pain. 30 tablet 0  . polyethylene glycol (MIRALAX / GLYCOLAX) packet Take 17 g by mouth daily as needed for mild constipation. 14 each 0  . prochlorperazine (COMPAZINE) 10 MG tablet Take 1 tablet (10 mg total) by mouth every 6 (six) hours as needed (NAUSEA). 30 tablet 1   No current facility-administered medications for this encounter.      ALLERGIES: Patient has no known allergies.   LABORATORY DATA:  Lab Results  Component Value Date   WBC 7.2 06/27/2016   HGB 12.5 (L) 06/27/2016   HCT 39.0 06/27/2016   MCV 89.0 06/27/2016   PLT 216 06/27/2016   Lab Results  Component Value Date   NA 144 06/27/2016   K 4.0 06/27/2016   CL 102 03/21/2016   CO2 28 06/27/2016   Lab Results  Component Value Date   ALT 38 06/27/2016   AST 27 06/27/2016   ALKPHOS 123 06/27/2016   BILITOT 0.71 06/27/2016     NARRATIVE: Colton Bush was seen  today for weekly treatment management. The chart was checked and the patient's films were reviewed.  The patient has completed 4/28 radiation treatments. Patient denies pain, nausea, gas, bowel abnormalities, or fatigue. Patient notes a good appetite, and drinks plenty of water.  PHYSICAL EXAMINATION: weight is 198 lb (89.8 kg). His oral temperature is 98.2 F (36.8 C). His blood pressure is 127/69 and his pulse is 74. His respiration is 20.       ASSESSMENT: The patient is doing satisfactorily with treatment.  PLAN: We will continue with the patient's radiation treatment as planned.        This document serves as a record of services personally performed by Kyung Rudd, MD. It was created on his behalf by Bethann Humble, a trained medical scribe. The creation of this record is  based on the scribe's personal observations and the provider's statements to them. This document has been checked and approved by the attending provider.

## 2016-07-03 NOTE — Progress Notes (Addendum)
Weekly rda tx pancreas/abdomen,4/ 28 completed,  no c/o pian, nausea,gas  Regular bowels , no fatigue, appetite good, drinks plenty water 2:13 PM BP 127/69 (BP Location: Right Arm, Patient Position: Sitting, Cuff Size: Normal)   Pulse 74   Temp 98.2 F (36.8 C) (Oral)   Resp 20   Wt 198 lb (89.8 kg)   BMI 28.41 kg/m   Wt Readings from Last 3 Encounters:  07/03/16 198 lb (89.8 kg)  06/20/16 196 lb 9.6 oz (89.2 kg)  06/16/16 197 lb 6.4 oz (89.5 kg)

## 2016-07-04 ENCOUNTER — Other Ambulatory Visit (HOSPITAL_BASED_OUTPATIENT_CLINIC_OR_DEPARTMENT_OTHER): Payer: 59

## 2016-07-04 ENCOUNTER — Encounter: Payer: Self-pay | Admitting: Hematology

## 2016-07-04 ENCOUNTER — Ambulatory Visit
Admission: RE | Admit: 2016-07-04 | Discharge: 2016-07-04 | Disposition: A | Discharge status: Home / Self Care | Payer: 59 | Source: Ambulatory Visit | Attending: Radiation Oncology | Admitting: Radiation Oncology

## 2016-07-04 ENCOUNTER — Other Ambulatory Visit: Payer: Self-pay | Admitting: *Deleted

## 2016-07-04 ENCOUNTER — Ambulatory Visit (HOSPITAL_BASED_OUTPATIENT_CLINIC_OR_DEPARTMENT_OTHER): Payer: 59 | Admitting: Hematology

## 2016-07-04 VITALS — BP 133/74 | HR 62 | Temp 97.4°F | Resp 18 | Ht 70.0 in | Wt 196.4 lb

## 2016-07-04 DIAGNOSIS — I10 Essential (primary) hypertension: Secondary | ICD-10-CM | POA: Diagnosis not present

## 2016-07-04 DIAGNOSIS — E1165 Type 2 diabetes mellitus with hyperglycemia: Secondary | ICD-10-CM

## 2016-07-04 DIAGNOSIS — E11641 Type 2 diabetes mellitus with hypoglycemia with coma: Secondary | ICD-10-CM | POA: Diagnosis not present

## 2016-07-04 DIAGNOSIS — C250 Malignant neoplasm of head of pancreas: Secondary | ICD-10-CM

## 2016-07-04 DIAGNOSIS — Z794 Long term (current) use of insulin: Secondary | ICD-10-CM | POA: Diagnosis not present

## 2016-07-04 DIAGNOSIS — IMO0001 Reserved for inherently not codable concepts without codable children: Secondary | ICD-10-CM

## 2016-07-04 LAB — CBC WITH DIFFERENTIAL/PLATELET
BASO%: 0.7 % (ref 0.0–2.0)
Basophils Absolute: 0 10*3/uL (ref 0.0–0.1)
EOS%: 2.7 % (ref 0.0–7.0)
Eosinophils Absolute: 0.2 10*3/uL (ref 0.0–0.5)
HCT: 36.7 % — ABNORMAL LOW (ref 38.4–49.9)
HGB: 12 g/dL — ABNORMAL LOW (ref 13.0–17.1)
LYMPH%: 17.3 % (ref 14.0–49.0)
MCH: 28.7 pg (ref 27.2–33.4)
MCHC: 32.7 g/dL (ref 32.0–36.0)
MCV: 87.8 fL (ref 79.3–98.0)
MONO#: 0.5 10*3/uL (ref 0.1–0.9)
MONO%: 8.3 % (ref 0.0–14.0)
NEUT#: 4 10*3/uL (ref 1.5–6.5)
NEUT%: 71 % (ref 39.0–75.0)
Platelets: 178 10*3/uL (ref 140–400)
RBC: 4.18 10*6/uL — ABNORMAL LOW (ref 4.20–5.82)
RDW: 16.8 % — ABNORMAL HIGH (ref 11.0–14.6)
WBC: 5.6 10*3/uL (ref 4.0–10.3)
lymph#: 1 10*3/uL (ref 0.9–3.3)

## 2016-07-04 LAB — COMPREHENSIVE METABOLIC PANEL
ALT: 30 U/L (ref 0–55)
AST: 22 U/L (ref 5–34)
Albumin: 3.7 g/dL (ref 3.5–5.0)
Alkaline Phosphatase: 119 U/L (ref 40–150)
Anion Gap: 9 mEq/L (ref 3–11)
BUN: 17 mg/dL (ref 7.0–26.0)
CO2: 26 mEq/L (ref 22–29)
Calcium: 9.3 mg/dL (ref 8.4–10.4)
Chloride: 110 mEq/L — ABNORMAL HIGH (ref 98–109)
Creatinine: 0.6 mg/dL — ABNORMAL LOW (ref 0.7–1.3)
EGFR: >90 mL/min/{1.73_m2} (ref >90)
Glucose: 54 mg/dl — ABNORMAL LOW (ref 70–140)
Potassium: 2.9 mEq/L — CL (ref 3.5–5.1)
Sodium: 144 mEq/L (ref 136–145)
Total Bilirubin: 0.69 mg/dL (ref 0.20–1.20)
Total Protein: 7.5 g/dL (ref 6.4–8.3)

## 2016-07-04 MED ORDER — POTASSIUM CHLORIDE CRYS ER 20 MEQ PO TBCR: EXTENDED_RELEASE_TABLET | ORAL | 1 refills | Status: DC

## 2016-07-04 NOTE — Progress Notes (Signed)
[facility]  [phone] [phone]  Clinic Follow up Note   Patient Care Team: [name], MD as PCP - General 07/04/2016  CHIEF COMPLAINTS:  Follow up pancreatic cancer  Oncology History   Adenocarcinoma of head of pancreas [facility])   Staging form: Pancreas, AJCC 7th Edition   - Clinical stage from 03/09/2016: Stage IIB (T2, N1, M0) - Signed by Truitt Merle, MD on 03/17/2016      Adenocarcinoma of head of pancreas ([place])   02/27/2016 Imaging    CT chest, abdomen and pelvis with contrast showed ill-defined heterogeneity of pancreatic head, highly suspicious for malignancy, ) quit about and biliary ductal dilatation, mildly prominent lymph nodes in the upper abdomen, largest 2 cm in the portocaval . No other metastasis.       03/09/2016 Initial Diagnosis    Adenocarcinoma of head of pancreas ([place])      03/09/2016 Procedure    Upper EUS showed a 3.5 cm irregular mass in the pancreatic head, causing pancreatic and biliary duct obstruction. The mass involves the portal vein 422 mm, strongly suggesting invasion. There is suspicious nearby adenopathy      03/09/2016 Initial Biopsy    Fine-needle aspiration of the pancreatic mass from EUS showed malignant cells consistent with adenocarcinoma.       03/28/2016 - 05/24/2016 Chemotherapy    neoadjuvant chemo with FOLFIRINOX every 2 weeks      06/28/2016 -  Radiation Therapy     neoadjuvant radiation      06/28/2016 -  Chemotherapy    The patient had palonosetron (ALOXI) injection 0.25 mg, 0.25 mg, Intravenous,  Once, 5 of 8 cycles  pegfilgrastim (NEULASTA) injection 6 mg, 6 mg, Subcutaneous, Once, 5 of 8 cycles  irinotecan (CAMPTOSAR) 360 mg in dextrose 5 % 500 mL chemo infusion, 180 mg/m2 = 360 mg, Intravenous,  Once, 5 of 8 cycles  leucovorin 812 mg in dextrose 5 % 250 mL infusion, 400 mg/m2 = 812 mg, Intravenous,  Once, 5 of 8 cycles  oxaliplatin (ELOXATIN) 175 mg in dextrose 5 % 500 mL chemo  infusion, 85 mg/m2 = 175 mg, Intravenous,  Once, 5 of 8 cycles  fluorouracil (ADRUCIL) chemo injection 800 mg, 400 mg/m2 = 800 mg, Intravenous,  Once, 4 of 7 cycles  fluorouracil (ADRUCIL) 4,850 mg in sodium chloride 0.9 % 53 mL chemo infusion, 2,400 mg/m2 = 4,850 mg, Intravenous, 1 Day/Dose, 5 of 8 cycles  for chemotherapy treatment.  Xeloda 2000 mg in the morning, 1500 mg in the evening, Monday through Friday, with concurrent radiation       HISTORY OF PRESENTING ILLNESS:  Colton Bush 60 y.o. male is here because of His newly diagnosed pancreatic cancer. He is accompanied by his wife to our multidisciplinary GI clinic today.  He noticed fatigue, anorexia and weight loss about 50bs in the past 4 months. It started when his endocrinologist changed his diabetic meds. He presented to hospital on 7/30 with jaundice. No pain or nausea. CT scan revealed a heterogeneous pancreatic head mass, highly suspicious for malignancy. Mild biliary and pancreatic duct dilatation, mildly prominent lymph nodes in the upper abdomen. He underwent ERCP and CBD stent placement, and subsequent upper EUS on 03/09/2016, which showed a 3.5 cm irregular mass in the pancreatic head, the mass involves the portal vein, no suspicious nearby adenopathy.  He has been feeling better after stent placement, with improved appetite and energy level. He feels normal again. No symptoms. BM is good,  he has gained about 8lbs back.   CURRENT THERAPY: concurrent chemotherapy and irradiation with Xeloda 2000mg  am and 1500mg  pm   INTERIM HISTORY: MR. Mcjunkin returns for follow up. He Has started concurrent chemoradiation last week, tolerating well so far. He denies any significant pain, fatigue, or other side effects. He continues working full-time. He did not have lunch when he came in for this appointment in the early afternoon, and if his blood test showed glucose was 54, he denies any sweating, dizziness, but his response was due to  slow, improved after eating snacks and orange juice.   MEDICAL HISTORY:  Past Medical History:  Diagnosis Date  . Arthritis    left hand  . Bronchitis 1977  . Cancer ([place]) 03/09/2016   pancreatic cancer  . Depression    takes Cymbalta daily  . Diabetes mellitus type II    sees [name]   . GERD (gastroesophageal reflux disease)    takes Omeprazole daily  . H/O hiatal hernia   . Hyperlipidemia    takes Zocor daily  . Hypertension    takes Amlodipine daily  . Neck pain    C4-7 stenosis and herniated disc  . Neuromuscular disorder ([place])    hiatal hernia  . Scoliosis    slight  . Spinal cord injury, C5-C7 ([place])    c4-c7  . Stiffness of hand joint    d/t cervical issues    SURGICAL HISTORY: Past Surgical History:  Procedure Laterality Date  . ANTERIOR CERVICAL DECOMP/DISCECTOMY FUSION  08/18/2011   Procedure: ANTERIOR CERVICAL DECOMPRESSION/DISCECTOMY FUSION 3 LEVELS;  Surgeon: Winfield Cunas, MD;  Location: [facility];  Service: Neurosurgery;  Laterality: N/A;  Anterior Cervical Four-Five/Five-Six/Six-Seven Decompression with Fusion, Plating, and Bonegraft  . CARPAL TUNNEL RELEASE  2013   bilateral, per Dr. Christella Noa   . COLONOSCOPY  10-30-14   per Dr. Olevia Perches, clear, repeat in 10 yrs   . egd with esophageal dilation  9-08   per Dr. Olevia Perches  . ERCP N/A 03/01/2016   Procedure: ENDOSCOPIC RETROGRADE CHOLANGIOPANCREATOGRAPHY (ERCP) with brushings and stent;  Surgeon: Doran Stabler, MD;  Location: [facility];  Service: Endoscopy;  Laterality: N/A;  . EUS N/A 03/09/2016   Procedure: ESOPHAGEAL ENDOSCOPIC ULTRASOUND (EUS) RADIAL;  Surgeon: Milus Banister, MD;  Location: [facility];  Service: Endoscopy;  Laterality: N/A;  . lymph nodes biopsy    . melanoma rt calf  1999  . PORTACATH PLACEMENT Left 03/22/2016   Procedure: INSERTION PORT-A-CATH;  Surgeon: Stark Klein, MD;  Location: [facility];  Service: General;  Laterality: Left;  . SPINE SURGERY    . TONSILLECTOMY     as a  child  . ULNAR TUNNEL RELEASE  2013   right arm, per Dr. Christella Noa   . UPPER GASTROINTESTINAL ENDOSCOPY      SOCIAL HISTORY: Social History   Social History  . Marital status: Married    Spouse name: [name]  . Number of children: N/A  . Years of education: N/A   Occupational History  . Driver    Social History Main Topics  . Smoking status: Never Smoker  . Smokeless tobacco: Never Used     Comment: tried a pipe 35 years ago   . Alcohol use No  . Drug use: No  . Sexual activity: Yes   Other Topics Concern  . Not on file   Social History Narrative   Married, wife Rosaria Ferries Nutritional therapist-   He works as  a driver for a company  He has one son 45 yo. Lives with his wife   FAMILY HISTORY: Family History  Problem Relation Age of Onset  . Heart disease Father   . Heart disease Brother 87  . Anesthesia problems Mother   . Heart disease Mother   . Dementia Mother   . Diabetes Sister   . Stroke Sister   . Colon cancer Neg Hx   . Rectal cancer Neg Hx   . Stomach cancer Neg Hx     ALLERGIES:  has No Known Allergies.  MEDICATIONS:  Current Outpatient Prescriptions  Medication Sig Dispense Refill  . amLODipine (NORVASC) 10 MG tablet TAKE 1 TABLET BY MOUTH EVERY DAY 30 tablet 6  . benazepril (LOTENSIN) 10 MG tablet TAKE 1 TABLET (10 MG TOTAL) BY MOUTH DAILY. 30 tablet 4  . capecitabine (XELODA) 500 MG tablet Take 4 tab in the morning and 3 tab in the evening, on the day of radiation only (Monday through Friday) 210 tablet 0  . FREESTYLE LITE test strip USE ONE STRIP TO CHECK GLUCOSE ONCE DAILY 25 each 0  . hyaluronate sodium (RADIAPLEXRX) GEL Apply 1 application topically daily.    . insulin aspart (NOVOLOG) 100 UNIT/ML injection Give every morning with breakfast and every evening with supper per the following sliding scale: give 3 units for glucose 150-175, give 6 units for glucose 176-200, give 9 units for glucose 201-250, give 12 units for glucose greater than 250 3  vial 11  . Insulin Detemir (LEVEMIR) 100 UNIT/ML Pen Inject 20 Units into the skin 2 (two) times daily. 90 mL 3  . Insulin Pen Needle 30G X 5 MM MISC Use one daily with insulin 100 each 2  . JARDIANCE 25 MG TABS tablet TAKE 1 TABLET BY MOUTH EVERY DAY 90 tablet 1  . Lancets (FREESTYLE) lancets USE AS INSTRUCTED TO CHECK BLOOD SUGAR ONCE A DAY 100 each 2  . lidocaine-prilocaine (EMLA) cream Apply to affected area once 30 g 3  . Multiple Vitamin (MULTIVITAMIN WITH MINERALS) TABS tablet Take 1 tablet by mouth daily.    Marland Kitchen omega-3 acid ethyl esters (LOVAZA) 1 g capsule Take 2 g by mouth daily.    Marland Kitchen omeprazole (PRILOSEC) 20 MG capsule Take 20 mg by mouth daily.    . ondansetron (ZOFRAN) 8 MG tablet Take 1 tablet (8 mg total) by mouth 2 (two) times daily as needed for refractory nausea / vomiting. Start on day 3 after chemotherapy. 30 tablet 1  . oxyCODONE (OXY IR/ROXICODONE) 5 MG immediate release tablet Take 1-2 tablets (5-10 mg total) by mouth every 4 (four) hours as needed for moderate pain. 30 tablet 0  . polyethylene glycol (MIRALAX / GLYCOLAX) packet Take 17 g by mouth daily as needed for mild constipation. 14 each 0  . prochlorperazine (COMPAZINE) 10 MG tablet Take 1 tablet (10 mg total) by mouth every 6 (six) hours as needed (NAUSEA). 30 tablet 1   No current facility-administered medications for this visit.     REVIEW OF SYSTEMS:   Constitutional: Denies fevers, chills or abnormal night sweats Eyes: Denies blurriness of vision, double vision or watery eyes Ears, nose, mouth, throat, and face: Denies mucositis or sore throat Respiratory: Denies cough, dyspnea or wheezes Cardiovascular: Denies palpitation, chest discomfort or lower extremity swelling Gastrointestinal:  Denies nausea, heartburn or change in bowel habits Skin: Denies abnormal skin rashes Lymphatics: Denies new lymphadenopathy or easy bruising Neurological:Denies numbness, tingling or new weaknesses Behavioral/Psych: Mood is  stable, no new changes  All other systems were reviewed with the patient and are negative.  PHYSICAL EXAMINATION: ECOG PERFORMANCE STATUS: 1  Vitals:   07/04/16 1358  BP: 133/74  Pulse: 62  Resp: 18  Temp: 97.4 F (36.3 C)   Filed Weights   07/04/16 1358  Weight: 196 lb 6.4 oz (89.1 kg)    GENERAL:alert, no distress and comfortable SKIN: skin color, texture, turgor are normal, no rashes or significant lesions EYES: normal, conjunctiva are pink and non-injected, sclera clear OROPHARYNX:no exudate, no erythema and lips, buccal mucosa, and tongue normal  NECK: supple, thyroid normal size, non-tender, without nodularity LYMPH:  no palpable lymphadenopathy in the cervical, axillary or inguinal LUNGS: clear to auscultation and percussion with normal breathing effort HEART: regular rate & rhythm and no murmurs and no lower extremity edema ABDOMEN:abdomen soft, non-tender and normal bowel sounds Musculoskeletal:no cyanosis of digits and no clubbing  PSYCH: alert & oriented x 3 with fluent speech NEURO: no focal motor/sensory deficits  LABORATORY DATA:  I have reviewed the data as listed CBC Latest Ref Rng & Units 07/04/2016 06/27/2016 06/20/2016  WBC 4.0 - 10.3 10e3/uL 5.6 7.2 7.7  Hemoglobin 13.0 - 17.1 g/dL 12.0(L) 12.5(L) 12.0(L)  Hematocrit 38.4 - 49.9 % 36.7(L) 39.0 36.8(L)  Platelets 140 - 400 10e3/uL 178 216 232   CMP Latest Ref Rng & Units 07/04/2016 06/27/2016 06/20/2016  Glucose 70 - 140 mg/dl 54(L) 77 76  BUN 7.0 - 26.0 mg/dL 17.0 19.1 15.5  Creatinine 0.7 - 1.3 mg/dL 0.6(L) 0.6(L) 0.6(L)  Sodium 136 - 145 mEq/L 144 144 142  Potassium 3.5 - 5.1 mEq/L 2.9(LL) 4.0 3.2(L)  Chloride 101 - 111 mmol/L - - -  CO2 22 - 29 mEq/L 26 28 25   Calcium 8.4 - 10.4 mg/dL 9.3 9.7 9.7  Total Protein 6.4 - 8.3 g/dL 7.5 7.6 7.7  Total Bilirubin 0.20 - 1.20 mg/dL 0.69 0.71 0.62  Alkaline Phos 40 - 150 U/L 119 123 104  AST 5 - 34 U/L 22 27 28   ALT 0 - 55 U/L 30 38 33   CA19.9 (0-35  U/ML) 02/28/2016: 1497 03/24/2016: 648 04/25/2016: 599 05/24/2016: 368  PATHOLOGY REPORT  Diagnosis 03/09/2016 FINE NEEDLE ASPIRATION, ENDOSCOPIC, PANCREAS (SPECIMEN 1 OF 1 COLLECTED 03/09/16): MALIGNANT CELLS CONSISTENT WITH ADENOCARCINOMA. BACKGROUND NECROTIC DEBRIS AND ACUTE INFLAMMATION.  Diagnosis 03/01/2016 BILE DUCT BRUSHING(SPECIMEN 1 OF 1 COLLECTED 03/01/16): RARE ATYPICAL CELLS, SEE COMMENT.  RADIOGRAPHIC STUDIES: I have personally reviewed the radiological images as listed and agreed with the findings in the report. No results found. EUS 03/09/2016 Dr. Ardis Hughs  - 3.5cm irregularly shaped, poorly defined mass in the pancreatic head. There is suspicious, nearby adenopathy. The mass is causing pancreatic and bililary duct obstruction (previously stented). The mass involves the portal vein for 48mm (abuttment and loss of usual tissue interface), strongly suggesting invasion. Preliminary cytology results are positive for malignancy (adenocarcinoma), await final report.  ASSESSMENT & PLAN: 60 year old Caucasian male, with past medical history of diabetes and hypertension, presented with epigastric pain, weight loss, and jaundice.  1. Primary pancreatic adenocarcinoma, in pancreas head, cT2N1M0, stage IIB, borderline resectable  -I have reviewed his CT abdomen and pelvis with contrast, EUS and biopsy findings with patient and his wife in great details. -His case was reviewed in our GI tumor board a few days ago. CT scan and US showed possible portal vein invasion from the pancreatic tumor, this is borderline resectable disease. No evidence of distant metastasis.  -We reviewed the  nature history of pancreatic cancer, and the overall survival rate with chemotherapy, surgery, and radiation. Patient and his wife was discouraged by the overall dismal long term survival rate (~20%)  -He was seen by surgeon Dr. Barry Dienes, Whipple surgery was discussed and offered to patient.  -I reviewed the high  risk of cancer recurrence after surgery, and the role of neoadjuvant and adjuvant chemotherapy to reduce the risk of recurrence, and shrink the tumor before surgery. -He has started neoadjuvant chemotherapy with FOLFIRINOX, tolerated well so far -We reviewed his restaging CT scan from 05/17/2016 in our GI tumor Board this morning, which showed a stable pancreatic mass, the tumor does not involve the SMA, but possible invading the port vein. -Neoadjuvant concurrent chemoradiation were recommended, after 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 with Dr. Barry Dienes and Dr. [name]. -He is tolerating concurrent chemoradiation well, lab results reviewed, he will continue.  2. Type 2 DM, hypoglycemia  - he will continue medication and follow up with his primary care physician Dr. Sarajane Jews  -We reviewed that his blood glucose will need to be monitored closely during the chemotherapy, and his medication may need to be adjusted  -His blood glucose has been significantly elevated, fasting in 150-mid 200s, postprandial in 500 range, his insulin has been adjusted, he is also more compliant with his diabetic diet  -His blood glucose overall has much improved. We again discussed diabetic diet.  -His BG was low when he skipped lunch, I encourage him  to monitor his blood glucose closely and do not skip meals since he is on insulin   3. HTN -He is on amlodipine and benazepril  -His blood pressure is normal today -The discussed chemotherapy may affect his blood pressure, and he is medication may need to be adjusted  Plan -He will continue concurrent chemoradiation with Xeloda 2000mg  in am, 1500mg  in evening -He will have weekly lab when he is on chemotherapy and radiation on Tuesdays -I plan to see him back in 2 weeks   All questions were answered. The patient knows to call the clinic with any problems, questions or concerns.  I spent 20 minutes counseling the patient face to face. The total time spent in the appointment was 25 minutes and more  than 50% was on counseling.     Truitt Merle, MD 07/04/2016

## 2016-07-05 ENCOUNTER — Other Ambulatory Visit: Payer: Self-pay | Admitting: Endocrinology

## 2016-07-05 ENCOUNTER — Ambulatory Visit
Admission: RE | Admit: 2016-07-05 | Discharge: 2016-07-05 | Disposition: A | Discharge status: Home / Self Care | Payer: 59 | Source: Ambulatory Visit | Attending: Radiation Oncology | Admitting: Radiation Oncology

## 2016-07-05 DIAGNOSIS — C250 Malignant neoplasm of head of pancreas: Secondary | ICD-10-CM | POA: Diagnosis not present

## 2016-07-06 ENCOUNTER — Ambulatory Visit
Admission: RE | Admit: 2016-07-06 | Discharge: 2016-07-06 | Disposition: A | Discharge status: Home / Self Care | Payer: 59 | Source: Ambulatory Visit | Attending: Radiation Oncology | Admitting: Radiation Oncology

## 2016-07-06 DIAGNOSIS — C250 Malignant neoplasm of head of pancreas: Secondary | ICD-10-CM | POA: Diagnosis not present

## 2016-07-07 ENCOUNTER — Ambulatory Visit
Admission: RE | Admit: 2016-07-07 | Discharge: 2016-07-07 | Disposition: A | Discharge status: Home / Self Care | Payer: 59 | Source: Ambulatory Visit | Attending: Radiation Oncology | Admitting: Radiation Oncology

## 2016-07-07 ENCOUNTER — Encounter: Payer: Self-pay | Admitting: Radiation Oncology

## 2016-07-07 VITALS — BP 134/71 | HR 62 | Temp 98.0°F | Resp 20 | Wt 199.6 lb

## 2016-07-07 DIAGNOSIS — C250 Malignant neoplasm of head of pancreas: Secondary | ICD-10-CM | POA: Diagnosis not present

## 2016-07-07 NOTE — Progress Notes (Signed)
Weekly rad txs pancreas 8/28  Compleetd,. No skin changes, no nausea, or abdominal cramping,gas, no diarrhea, appetite good no pain 3:07 PM BP 134/71 (BP Location: Right Arm, Patient Position: Sitting, Cuff Size: Normal)   Pulse 62   Temp 98 F (36.7 C) (Oral)   Resp 20   Wt 199 lb 9.6 oz (90.5 kg)   BMI 28.64 kg/m   Wt Readings from Last 3 Encounters:  07/07/16 199 lb 9.6 oz (90.5 kg)  07/04/16 196 lb 6.4 oz (89.1 kg)  07/03/16 198 lb (89.8 kg)

## 2016-07-07 NOTE — Progress Notes (Signed)
Department of Radiation Oncology  Phone:  [phone] Fax:        [phone]  Weekly Treatment Note    Name: Colton Bush Date: 07/07/2016 MRN: [id number] DOB: 1956/01/27   Diagnosis:     ICD-9-CM ICD-10-CM   1. Adenocarcinoma of head of pancreas ([place]) 157.0 C25.0      Current dose: 14.4 Gy  Current fraction:8   MEDICATIONS: Current Outpatient Prescriptions  Medication Sig Dispense Refill  . amLODipine (NORVASC) 10 MG tablet TAKE 1 TABLET BY MOUTH EVERY DAY 30 tablet 6  . benazepril (LOTENSIN) 10 MG tablet TAKE 1 TABLET (10 MG TOTAL) BY MOUTH DAILY. 30 tablet 4  . capecitabine (XELODA) 500 MG tablet Take 4 tab in the morning and 3 tab in the evening, on the day of radiation only (Monday through Friday) 210 tablet 0  . FREESTYLE LITE test strip USE ONE STRIP TO CHECK GLUCOSE ONCE DAILY. PLEASE  SCHEDULE FOLLOW UP 50 each 0  . hyaluronate sodium (RADIAPLEXRX) GEL Apply 1 application topically daily.    . insulin aspart (NOVOLOG) 100 UNIT/ML injection Give every morning with breakfast and every evening with supper per the following sliding scale: give 3 units for glucose 150-175, give 6 units for glucose 176-200, give 9 units for glucose 201-250, give 12 units for glucose greater than 250 3 vial 11  . Insulin Detemir (LEVEMIR) 100 UNIT/ML Pen Inject 20 Units into the skin 2 (two) times daily. 90 mL 3  . Insulin Pen Needle 30G X 5 MM MISC Use one daily with insulin 100 each 2  . JARDIANCE 25 MG TABS tablet TAKE 1 TABLET BY MOUTH EVERY DAY 90 tablet 1  . Lancets (FREESTYLE) lancets USE AS INSTRUCTED TO CHECK BLOOD SUGAR ONCE A DAY 100 each 2  . lidocaine-prilocaine (EMLA) cream Apply to affected area once 30 g 3  . Multiple Vitamin (MULTIVITAMIN WITH MINERALS) TABS tablet Take 1 tablet by mouth daily.    Marland Kitchen omega-3 acid ethyl esters (LOVAZA) 1 g capsule Take 2 g by mouth daily.    Marland Kitchen omeprazole (PRILOSEC) 20 MG capsule Take 20 mg by mouth daily.    . ondansetron (ZOFRAN) 8 MG  tablet Take 1 tablet (8 mg total) by mouth 2 (two) times daily as needed for refractory nausea / vomiting. Start on day 3 after chemotherapy. 30 tablet 1  . oxyCODONE (OXY IR/ROXICODONE) 5 MG immediate release tablet Take 1-2 tablets (5-10 mg total) by mouth every 4 (four) hours as needed for moderate pain. 30 tablet 0  . polyethylene glycol (MIRALAX / GLYCOLAX) packet Take 17 g by mouth daily as needed for mild constipation. 14 each 0  . potassium chloride SA (K-DUR,KLOR-CON) 20 MEQ tablet 3 tabs today, 1 tab twice daily x 5 days, then 1 tab daily 40 tablet 1  . prochlorperazine (COMPAZINE) 10 MG tablet Take 1 tablet (10 mg total) by mouth every 6 (six) hours as needed (NAUSEA). 30 tablet 1   No current facility-administered medications for this encounter.      ALLERGIES: Patient has no known allergies.   LABORATORY DATA:  Lab Results  Component Value Date   WBC 5.6 07/04/2016   HGB 12.0 (L) 07/04/2016   HCT 36.7 (L) 07/04/2016   MCV 87.8 07/04/2016   PLT 178 07/04/2016   Lab Results  Component Value Date   NA 144 07/04/2016   K 2.9 (LL) 07/04/2016   CL 102 03/21/2016   CO2 26 07/04/2016   Lab Results  Component Value Date   ALT 30 07/04/2016   AST 22 07/04/2016   ALKPHOS 119 07/04/2016   BILITOT 0.69 07/04/2016     NARRATIVE: Colton Bush was seen today for weekly treatment management. The chart was checked and the patient's films were reviewed.  Weekly rad txs pancreas 8/28  Compleetd,. No skin changes, no nausea, or abdominal cramping,gas, no diarrhea, appetite good no pain 3:34 PM BP 134/71 (BP Location: Right Arm, Patient Position: Sitting, Cuff Size: Normal)   Pulse 62   Temp 98 F (36.7 C) (Oral)   Resp 20   Wt 199 lb 9.6 oz (90.5 kg)   BMI 28.64 kg/m   Wt Readings from Last 3 Encounters:  07/07/16 199 lb 9.6 oz (90.5 kg)  07/04/16 196 lb 6.4 oz (89.1 kg)  07/03/16 198 lb (89.8 kg)    PHYSICAL EXAMINATION: weight is 199 lb 9.6 oz (90.5 kg). His oral  temperature is 98 F (36.7 C). His blood pressure is 134/71 and his pulse is 62. His respiration is 20.        ASSESSMENT: The patient is doing satisfactorily with treatment.  PLAN: We will continue with the patient's radiation treatment as planned.

## 2016-07-10 ENCOUNTER — Other Ambulatory Visit: Payer: Self-pay | Admitting: Family Medicine

## 2016-07-10 ENCOUNTER — Ambulatory Visit
Admission: RE | Admit: 2016-07-10 | Discharge: 2016-07-10 | Disposition: A | Discharge status: Home / Self Care | Payer: 59 | Source: Ambulatory Visit | Attending: Radiation Oncology | Admitting: Radiation Oncology

## 2016-07-10 DIAGNOSIS — C250 Malignant neoplasm of head of pancreas: Secondary | ICD-10-CM | POA: Diagnosis not present

## 2016-07-10 NOTE — Progress Notes (Signed)
  Radiation Oncology         [phone] ________________________________  Name: Colton Bush MRN: [id number]  Date: 06/16/2016  DOB: 07-13-1956  SIMULATION AND TREATMENT PLANNING NOTE  DIAGNOSIS:     ICD-9-CM ICD-10-CM   1. Adenocarcinoma of head of pancreas ([place]) 157.0 C25.0      Site:  abdomen  NARRATIVE:  The patient was brought to the [facility].  Identity was confirmed.  All relevant records and images related to the planned course of therapy were reviewed.   Written consent to proceed with treatment was confirmed which was freely given after reviewing the details related to the planned course of therapy had been reviewed with the patient.  Then, the patient was set-up in a stable reproducible  supine position for radiation therapy.  CT images were obtained.  Surface markings were placed.    Medically necessary complex treatment device(s) for immobilization:  Customized vac lock bag.   The CT images were loaded into the planning software.  Then the target and avoidance structures were contoured.  Treatment planning then occurred.  The radiation prescription was entered and confirmed.  A total of 8 complex treatment devices were fabricated which relate to the designed radiation treatment fields. Each of these customized fields/ complex treatment devices will be used on a daily basis during the radiation course. I have requested : 3D Simulation  I have requested a DVH of the following structures: Target volume, left kidney, right kidney, liver, spinal cord.   The patient will undergo daily image guidance to ensure accurate localization of the target, and adequate minimize dose to the normal surrounding structures in close proximity to the target.   PLAN:  The patient will receive 45 Gy in 25 fractions. It is anticipated that he will receive a 9 gray boost to yield a final dose of 54 gray.  Special treatment procedure The patient will also receive concurrent  chemotherapy during the treatment. The patient may therefore experience increased toxicity or side effects and the patient will be monitored for such problems. This may require extra lab work as necessary. This therefore constitutes a special treatment procedure.   ________________________________   Jodelle Gross, MD, PhD

## 2016-07-11 ENCOUNTER — Other Ambulatory Visit (HOSPITAL_BASED_OUTPATIENT_CLINIC_OR_DEPARTMENT_OTHER): Payer: 59

## 2016-07-11 ENCOUNTER — Ambulatory Visit (HOSPITAL_BASED_OUTPATIENT_CLINIC_OR_DEPARTMENT_OTHER): Payer: 59

## 2016-07-11 ENCOUNTER — Ambulatory Visit
Admission: RE | Admit: 2016-07-11 | Discharge: 2016-07-11 | Disposition: A | Discharge status: Home / Self Care | Payer: 59 | Source: Ambulatory Visit | Attending: Radiation Oncology | Admitting: Radiation Oncology

## 2016-07-11 VITALS — BP 146/74 | HR 70 | Temp 98.2°F | Resp 18

## 2016-07-11 DIAGNOSIS — C250 Malignant neoplasm of head of pancreas: Secondary | ICD-10-CM | POA: Diagnosis not present

## 2016-07-11 DIAGNOSIS — Z95828 Presence of other vascular implants and grafts: Secondary | ICD-10-CM

## 2016-07-11 LAB — COMPREHENSIVE METABOLIC PANEL
ALT: 41 U/L (ref 0–55)
AST: 30 U/L (ref 5–34)
Albumin: 3.6 g/dL (ref 3.5–5.0)
Alkaline Phosphatase: 107 U/L (ref 40–150)
Anion Gap: 8 mEq/L (ref 3–11)
BUN: 11.5 mg/dL (ref 7.0–26.0)
CO2: 26 mEq/L (ref 22–29)
Calcium: 9.1 mg/dL (ref 8.4–10.4)
Chloride: 108 mEq/L (ref 98–109)
Creatinine: 0.6 mg/dL — ABNORMAL LOW (ref 0.7–1.3)
EGFR: >90 mL/min/{1.73_m2} (ref >90)
Glucose: 73 mg/dl (ref 70–140)
Potassium: 3.6 mEq/L (ref 3.5–5.1)
Sodium: 142 mEq/L (ref 136–145)
Total Bilirubin: 0.57 mg/dL (ref 0.20–1.20)
Total Protein: 7.3 g/dL (ref 6.4–8.3)

## 2016-07-11 LAB — CBC WITH DIFFERENTIAL/PLATELET
BASO%: 0.2 % (ref 0.0–2.0)
Basophils Absolute: 0 10*3/uL (ref 0.0–0.1)
EOS%: 5.3 % (ref 0.0–7.0)
Eosinophils Absolute: 0.3 10*3/uL (ref 0.0–0.5)
HCT: 35.7 % — ABNORMAL LOW (ref 38.4–49.9)
HGB: 11.7 g/dL — ABNORMAL LOW (ref 13.0–17.1)
LYMPH%: 9.9 % — ABNORMAL LOW (ref 14.0–49.0)
MCH: 29.4 pg (ref 27.2–33.4)
MCHC: 32.8 g/dL (ref 32.0–36.0)
MCV: 89.7 fL (ref 79.3–98.0)
MONO#: 0.5 10*3/uL (ref 0.1–0.9)
MONO%: 9.3 % (ref 0.0–14.0)
NEUT#: 3.8 10*3/uL (ref 1.5–6.5)
NEUT%: 75.3 % — ABNORMAL HIGH (ref 39.0–75.0)
Platelets: 146 10*3/uL (ref 140–400)
RBC: 3.98 10*6/uL — ABNORMAL LOW (ref 4.20–5.82)
RDW: 17.2 % — ABNORMAL HIGH (ref 11.0–14.6)
WBC: 5.1 10*3/uL (ref 4.0–10.3)
lymph#: 0.5 10*3/uL — ABNORMAL LOW (ref 0.9–3.3)

## 2016-07-11 MED ORDER — SODIUM CHLORIDE 0.9 % IJ SOLN
10.0000 mL | INTRAMUSCULAR | Status: DC | PRN
  Administered 2016-07-11: 10 mL via INTRAVENOUS
  Filled 2016-07-11: qty 10

## 2016-07-11 MED ORDER — HEPARIN SOD (PORK) LOCK FLUSH 100 UNIT/ML IV SOLN
500.0000 [IU] | Freq: Once | INTRAVENOUS | Status: AC | PRN
  Administered 2016-07-11: 500 [IU] via INTRAVENOUS
  Filled 2016-07-11: qty 5

## 2016-07-11 NOTE — Telephone Encounter (Signed)
Denied.  Filled on 04/28/16 for 5 months.  Request is too early.

## 2016-07-11 NOTE — Patient Instructions (Signed)

## 2016-07-12 ENCOUNTER — Ambulatory Visit
Admission: RE | Admit: 2016-07-12 | Discharge: 2016-07-12 | Disposition: A | Discharge status: Home / Self Care | Payer: 59 | Source: Ambulatory Visit | Attending: Radiation Oncology | Admitting: Radiation Oncology

## 2016-07-12 DIAGNOSIS — C250 Malignant neoplasm of head of pancreas: Secondary | ICD-10-CM | POA: Diagnosis not present

## 2016-07-13 ENCOUNTER — Ambulatory Visit
Admission: RE | Admit: 2016-07-13 | Discharge: 2016-07-13 | Disposition: A | Discharge status: Home / Self Care | Payer: 59 | Source: Ambulatory Visit | Attending: Radiation Oncology | Admitting: Radiation Oncology

## 2016-07-13 ENCOUNTER — Encounter: Payer: Self-pay | Admitting: Radiation Oncology

## 2016-07-13 VITALS — BP 136/79 | HR 71 | Temp 98.2°F | Resp 20 | Wt 202.6 lb

## 2016-07-13 DIAGNOSIS — C250 Malignant neoplasm of head of pancreas: Secondary | ICD-10-CM | POA: Diagnosis not present

## 2016-07-13 NOTE — Progress Notes (Signed)
Weekly rad txs pancreas/12/28 completed, no c/o  Pain,nausea, diarrhea, no pain or gas or fatigue, appetite good no issues 9:28 AM BP 136/79 (BP Location: Left Arm, Patient Position: Sitting, Cuff Size: Normal)   Pulse 71   Temp 98.2 F (36.8 C) (Oral)   Resp 20   Wt 202 lb 9.6 oz (91.9 kg)   BMI 29.07 kg/m   Wt Readings from Last 3 Encounters:  07/13/16 202 lb 9.6 oz (91.9 kg)  07/07/16 199 lb 9.6 oz (90.5 kg)  07/04/16 196 lb 6.4 oz (89.1 kg)

## 2016-07-13 NOTE — Progress Notes (Signed)
Department of Radiation Oncology  Phone:  [phone] Fax:        [phone]  Weekly Treatment Note    Name: Colton Bush Date: 07/13/2016 MRN: [id number] DOB: 02-05-1956   Diagnosis:     ICD-9-CM ICD-10-CM   1. Adenocarcinoma of head of pancreas (HCC) 157.0 C25.0      Current dose: 21.6 Gy  Current fraction:12   MEDICATIONS: Current Outpatient Prescriptions  Medication Sig Dispense Refill  . amLODipine (NORVASC) 10 MG tablet TAKE 1 TABLET BY MOUTH EVERY DAY 30 tablet 6  . benazepril (LOTENSIN) 10 MG tablet TAKE 1 TABLET (10 MG TOTAL) BY MOUTH DAILY. 30 tablet 4  . capecitabine (XELODA) 500 MG tablet Take 4 tab in the morning and 3 tab in the evening, on the day of radiation only (Monday through Friday) 210 tablet 0  . FREESTYLE LITE test strip USE ONE STRIP TO CHECK GLUCOSE ONCE DAILY. PLEASE  SCHEDULE FOLLOW UP 50 each 0  . hyaluronate sodium (RADIAPLEXRX) GEL Apply 1 application topically daily.    . insulin aspart (NOVOLOG) 100 UNIT/ML injection Give every morning with breakfast and every evening with supper per the following sliding scale: give 3 units for glucose 150-175, give 6 units for glucose 176-200, give 9 units for glucose 201-250, give 12 units for glucose greater than 250 3 vial 11  . Insulin Detemir (LEVEMIR) 100 UNIT/ML Pen Inject 20 Units into the skin 2 (two) times daily. 90 mL 3  . Insulin Pen Needle 30G X 5 MM MISC Use one daily with insulin 100 each 2  . JARDIANCE 25 MG TABS tablet TAKE 1 TABLET BY MOUTH EVERY DAY 90 tablet 1  . Lancets (FREESTYLE) lancets USE AS INSTRUCTED TO CHECK BLOOD SUGAR ONCE A DAY 100 each 2  . lidocaine-prilocaine (EMLA) cream Apply to affected area once 30 g 3  . Multiple Vitamin (MULTIVITAMIN WITH MINERALS) TABS tablet Take 1 tablet by mouth daily.    Marland Kitchen omega-3 acid ethyl esters (LOVAZA) 1 g capsule Take 2 g by mouth daily.    Marland Kitchen omeprazole (PRILOSEC) 20 MG capsule Take 20 mg by mouth daily.    . ondansetron (ZOFRAN) 8  MG tablet Take 1 tablet (8 mg total) by mouth 2 (two) times daily as needed for refractory nausea / vomiting. Start on day 3 after chemotherapy. 30 tablet 1  . oxyCODONE (OXY IR/ROXICODONE) 5 MG immediate release tablet Take 1-2 tablets (5-10 mg total) by mouth every 4 (four) hours as needed for moderate pain. 30 tablet 0  . polyethylene glycol (MIRALAX / GLYCOLAX) packet Take 17 g by mouth daily as needed for mild constipation. 14 each 0  . potassium chloride SA (K-DUR,KLOR-CON) 20 MEQ tablet 3 tabs today, 1 tab twice daily x 5 days, then 1 tab daily 40 tablet 1  . prochlorperazine (COMPAZINE) 10 MG tablet Take 1 tablet (10 mg total) by mouth every 6 (six) hours as needed (NAUSEA). 30 tablet 1   No current facility-administered medications for this encounter.      ALLERGIES: Patient has no known allergies.   LABORATORY DATA:  Lab Results  Component Value Date   WBC 5.1 07/11/2016   HGB 11.7 (L) 07/11/2016   HCT 35.7 (L) 07/11/2016   MCV 89.7 07/11/2016   PLT 146 07/11/2016   Lab Results  Component Value Date   NA 142 07/11/2016   K 3.6 07/11/2016   CL 102 03/21/2016   CO2 26 07/11/2016   Lab Results  Component  Value Date   ALT 41 07/11/2016   AST 30 07/11/2016   ALKPHOS 107 07/11/2016   BILITOT 0.57 07/11/2016     NARRATIVE: Colton Bush was seen today for weekly treatment management. The chart was checked and the patient's films were reviewed.  He returns for weekly radiation treatment to the pancreas, 12/28 completed. He has no complaints of pain, nausea, or diarrhea. He denies pain, gas, or fatigue. Appetite is good and without issue.  PHYSICAL EXAMINATION: weight is 202 lb 9.6 oz (91.9 kg). His oral temperature is 98.2 F (36.8 C). His blood pressure is 136/79 and his pulse is 71. His respiration is 20.      Alert and in no acute distress.  ASSESSMENT: The patient is doing satisfactorily with treatment.  PLAN: We will continue with the patient's radiation  treatment as planned.   ------------------------------------------------  Jodelle Gross, MD, PhD   This document serves as a record of services personally performed by Kyung Rudd, MD. It was created on his behalf by Arlyce Harman, a trained medical scribe. The creation of this record is based on the scribe's personal observations and the provider's statements to them. This document has been checked and approved by the attending provider.

## 2016-07-14 ENCOUNTER — Ambulatory Visit
Admission: RE | Admit: 2016-07-14 | Discharge: 2016-07-14 | Disposition: A | Discharge status: Home / Self Care | Payer: 59 | Source: Ambulatory Visit | Attending: Radiation Oncology | Admitting: Radiation Oncology

## 2016-07-14 DIAGNOSIS — C250 Malignant neoplasm of head of pancreas: Secondary | ICD-10-CM | POA: Diagnosis not present

## 2016-07-17 ENCOUNTER — Ambulatory Visit
Admission: RE | Admit: 2016-07-17 | Discharge: 2016-07-17 | Disposition: A | Discharge status: Home / Self Care | Payer: 59 | Source: Ambulatory Visit | Attending: Radiation Oncology | Admitting: Radiation Oncology

## 2016-07-17 DIAGNOSIS — C250 Malignant neoplasm of head of pancreas: Secondary | ICD-10-CM | POA: Diagnosis not present

## 2016-07-18 ENCOUNTER — Encounter: Payer: Self-pay | Admitting: Hematology

## 2016-07-18 ENCOUNTER — Other Ambulatory Visit (HOSPITAL_BASED_OUTPATIENT_CLINIC_OR_DEPARTMENT_OTHER): Payer: 59

## 2016-07-18 ENCOUNTER — Ambulatory Visit
Admission: RE | Admit: 2016-07-18 | Discharge: 2016-07-18 | Disposition: A | Discharge status: Home / Self Care | Payer: 59 | Source: Ambulatory Visit | Attending: Radiation Oncology | Admitting: Radiation Oncology

## 2016-07-18 ENCOUNTER — Other Ambulatory Visit: Payer: Self-pay | Admitting: *Deleted

## 2016-07-18 ENCOUNTER — Ambulatory Visit (HOSPITAL_BASED_OUTPATIENT_CLINIC_OR_DEPARTMENT_OTHER): Payer: 59 | Admitting: Hematology

## 2016-07-18 ENCOUNTER — Encounter: Payer: Self-pay | Admitting: *Deleted

## 2016-07-18 VITALS — BP 150/84 | HR 79 | Temp 97.6°F | Resp 18 | Ht 70.0 in | Wt 201.3 lb

## 2016-07-18 DIAGNOSIS — C250 Malignant neoplasm of head of pancreas: Secondary | ICD-10-CM | POA: Diagnosis not present

## 2016-07-18 DIAGNOSIS — IMO0001 Reserved for inherently not codable concepts without codable children: Secondary | ICD-10-CM

## 2016-07-18 DIAGNOSIS — I10 Essential (primary) hypertension: Secondary | ICD-10-CM

## 2016-07-18 DIAGNOSIS — E1165 Type 2 diabetes mellitus with hyperglycemia: Secondary | ICD-10-CM

## 2016-07-18 DIAGNOSIS — Z794 Long term (current) use of insulin: Secondary | ICD-10-CM | POA: Diagnosis not present

## 2016-07-18 LAB — COMPREHENSIVE METABOLIC PANEL
ALT: 38 U/L (ref 0–55)
AST: 25 U/L (ref 5–34)
Albumin: 3.7 g/dL (ref 3.5–5.0)
Alkaline Phosphatase: 108 U/L (ref 40–150)
Anion Gap: 7 mEq/L (ref 3–11)
BUN: 12.4 mg/dL (ref 7.0–26.0)
CO2: 28 mEq/L (ref 22–29)
Calcium: 9.4 mg/dL (ref 8.4–10.4)
Chloride: 109 mEq/L (ref 98–109)
Creatinine: 0.6 mg/dL — ABNORMAL LOW (ref 0.7–1.3)
EGFR: >90 mL/min/{1.73_m2} (ref >90)
Glucose: 63 mg/dl — ABNORMAL LOW (ref 70–140)
Potassium: 4 mEq/L (ref 3.5–5.1)
Sodium: 144 mEq/L (ref 136–145)
Total Bilirubin: 0.42 mg/dL (ref 0.20–1.20)
Total Protein: 7.3 g/dL (ref 6.4–8.3)

## 2016-07-18 LAB — CBC WITH DIFFERENTIAL/PLATELET
BASO%: 0.9 % (ref 0.0–2.0)
Basophils Absolute: 0 10*3/uL (ref 0.0–0.1)
EOS%: 5.5 % (ref 0.0–7.0)
Eosinophils Absolute: 0.3 10*3/uL (ref 0.0–0.5)
HCT: 36.2 % — ABNORMAL LOW (ref 38.4–49.9)
HGB: 12 g/dL — ABNORMAL LOW (ref 13.0–17.1)
LYMPH%: 9 % — ABNORMAL LOW (ref 14.0–49.0)
MCH: 29.8 pg (ref 27.2–33.4)
MCHC: 33.1 g/dL (ref 32.0–36.0)
MCV: 89.8 fL (ref 79.3–98.0)
MONO#: 0.6 10*3/uL (ref 0.1–0.9)
MONO%: 13.8 % (ref 0.0–14.0)
NEUT#: 3.3 10*3/uL (ref 1.5–6.5)
NEUT%: 70.8 % (ref 39.0–75.0)
Platelets: 130 10*3/uL — ABNORMAL LOW (ref 140–400)
RBC: 4.03 10*6/uL — ABNORMAL LOW (ref 4.20–5.82)
RDW: 17.6 % — ABNORMAL HIGH (ref 11.0–14.6)
WBC: 4.6 10*3/uL (ref 4.0–10.3)
lymph#: 0.4 10*3/uL — ABNORMAL LOW (ref 0.9–3.3)
nRBC: 0 % (ref 0–0)

## 2016-07-18 NOTE — Progress Notes (Signed)
Oncology Nurse Navigator Documentation  Oncology Nurse Navigator Flowsheets 07/18/2016  Navigator Location [facility]  Referral date to RadOnc/MedOnc -  Navigator Encounter Type Follow-up Appt  Telephone -  Abnormal Finding Date -  Confirmed Diagnosis Date -  Treatment Initiated Date -  Patient Visit Type MedOnc  Treatment Phase Post-Tx Follow-up--will see Dr. Barry Dienes on 1/8 to discuss surgery plans  Barriers/Navigation Needs Financial--asking for supplement coupons  Education -  Interventions Other--provided coupons for [employer] and [employer] -  Education Method -  Support Groups/Services -  Acuity Level 1  Time Spent with Patient 15

## 2016-07-18 NOTE — Progress Notes (Signed)
[facility]  [phone] [phone]  Clinic Follow up Note   Patient Care Team: [name], MD as PCP - General 07/18/2016  CHIEF COMPLAINTS:  Follow up pancreatic cancer  Oncology History   Adenocarcinoma of head of pancreas [facility])   Staging form: Pancreas, AJCC 7th Edition   - Clinical stage from 03/09/2016: Stage IIB (T2, N1, M0) - Signed by Truitt Merle, MD on 03/17/2016      Adenocarcinoma of head of pancreas ([place])   02/27/2016 Imaging    CT chest, abdomen and pelvis with contrast showed ill-defined heterogeneity of pancreatic head, highly suspicious for malignancy, ) quit about and biliary ductal dilatation, mildly prominent lymph nodes in the upper abdomen, largest 2 cm in the portocaval . No other metastasis.       03/09/2016 Initial Diagnosis    Adenocarcinoma of head of pancreas ([place])      03/09/2016 Procedure    Upper EUS showed a 3.5 cm irregular mass in the pancreatic head, causing pancreatic and biliary duct obstruction. The mass involves the portal vein 422 mm, strongly suggesting invasion. There is suspicious nearby adenopathy      03/09/2016 Initial Biopsy    Fine-needle aspiration of the pancreatic mass from EUS showed malignant cells consistent with adenocarcinoma.       03/28/2016 - 05/24/2016 Chemotherapy    neoadjuvant chemo with FOLFIRINOX every 2 weeks      06/28/2016 -  Radiation Therapy     neoadjuvant radiation      06/28/2016 -  Chemotherapy    The patient had palonosetron (ALOXI) injection 0.25 mg, 0.25 mg, Intravenous,  Once, 5 of 8 cycles  pegfilgrastim (NEULASTA) injection 6 mg, 6 mg, Subcutaneous, Once, 5 of 8 cycles  irinotecan (CAMPTOSAR) 360 mg in dextrose 5 % 500 mL chemo infusion, 180 mg/m2 = 360 mg, Intravenous,  Once, 5 of 8 cycles  leucovorin 812 mg in dextrose 5 % 250 mL infusion, 400 mg/m2 = 812 mg, Intravenous,  Once, 5 of 8 cycles  oxaliplatin (ELOXATIN) 175 mg in dextrose 5 % 500 mL  chemo infusion, 85 mg/m2 = 175 mg, Intravenous,  Once, 5 of 8 cycles  fluorouracil (ADRUCIL) chemo injection 800 mg, 400 mg/m2 = 800 mg, Intravenous,  Once, 4 of 7 cycles  fluorouracil (ADRUCIL) 4,850 mg in sodium chloride 0.9 % 53 mL chemo infusion, 2,400 mg/m2 = 4,850 mg, Intravenous, 1 Day/Dose, 5 of 8 cycles  for chemotherapy treatment.  Xeloda 2000 mg in the morning, 1500 mg in the evening, Monday through Friday, with concurrent radiation       HISTORY OF PRESENTING ILLNESS:  Colton Bush 60 y.o. male is here because of His newly diagnosed pancreatic cancer. He is accompanied by his wife to our multidisciplinary GI clinic today.  He noticed fatigue, anorexia and weight loss about 50bs in the past 4 months. It started when his endocrinologist changed his diabetic meds. He presented to hospital on 7/30 with jaundice. No pain or nausea. CT scan revealed a heterogeneous pancreatic head mass, highly suspicious for malignancy. Mild biliary and pancreatic duct dilatation, mildly prominent lymph nodes in the upper abdomen. He underwent ERCP and CBD stent placement, and subsequent upper EUS on 03/09/2016, which showed a 3.5 cm irregular mass in the pancreatic head, the mass involves the portal vein, no suspicious nearby adenopathy.  He has been feeling better after stent placement, with improved appetite and energy level. He feels normal again. No symptoms. BM is good,  he has gained about 8lbs back.   CURRENT THERAPY: concurrent chemotherapy and irradiation with Xeloda 2000mg  am and 1500mg  pm   INTERIM HISTORY: MR. Schnelle returns for follow up. He Is on concurrent chemotherapy and irradiation week 3, tolerating very well so far. He denies any significant abdominal pain, nausea, diarrhea, or other complaints. His blood glucose has been in 100's range, he continues working full-time. No other complaints.  MEDICAL HISTORY:  Past Medical History:  Diagnosis Date  . Arthritis    left hand  .  Bronchitis 1977  . Cancer ([place]) 03/09/2016   pancreatic cancer  . Depression    takes Cymbalta daily  . Diabetes mellitus type II    sees [name]   . GERD (gastroesophageal reflux disease)    takes Omeprazole daily  . H/O hiatal hernia   . Hyperlipidemia    takes Zocor daily  . Hypertension    takes Amlodipine daily  . Neck pain    C4-7 stenosis and herniated disc  . Neuromuscular disorder ([place])    hiatal hernia  . Scoliosis    slight  . Spinal cord injury, C5-C7 ([place])    c4-c7  . Stiffness of hand joint    d/t cervical issues    SURGICAL HISTORY: Past Surgical History:  Procedure Laterality Date  . ANTERIOR CERVICAL DECOMP/DISCECTOMY FUSION  08/18/2011   Procedure: ANTERIOR CERVICAL DECOMPRESSION/DISCECTOMY FUSION 3 LEVELS;  Surgeon: Winfield Cunas, MD;  Location: [place] NEURO ORS;  Service: Neurosurgery;  Laterality: N/A;  Anterior Cervical Four-Five/Five-Six/Six-Seven Decompression with Fusion, Plating, and Bonegraft  . CARPAL TUNNEL RELEASE  2013   bilateral, per Dr. Christella Noa   . COLONOSCOPY  10-30-14   per Dr. Olevia Perches, clear, repeat in 10 yrs   . egd with esophageal dilation  9-08   per Dr. Olevia Perches  . ERCP N/A 03/01/2016   Procedure: ENDOSCOPIC RETROGRADE CHOLANGIOPANCREATOGRAPHY (ERCP) with brushings and stent;  Surgeon: Doran Stabler, MD;  Location: [facility] ENDOSCOPY;  Service: Endoscopy;  Laterality: N/A;  . EUS N/A 03/09/2016   Procedure: ESOPHAGEAL ENDOSCOPIC ULTRASOUND (EUS) RADIAL;  Surgeon: Milus Banister, MD;  Location: [facility] ENDOSCOPY;  Service: Endoscopy;  Laterality: N/A;  . lymph nodes biopsy    . melanoma rt calf  1999  . PORTACATH PLACEMENT Left 03/22/2016   Procedure: INSERTION PORT-A-CATH;  Surgeon: Stark Klein, MD;  Location: [facility] ORS;  Service: General;  Laterality: Left;  . SPINE SURGERY    . TONSILLECTOMY     as a child  . ULNAR TUNNEL RELEASE  2013   right arm, per Dr. Christella Noa   . UPPER GASTROINTESTINAL ENDOSCOPY      SOCIAL HISTORY: Social History    Social History  . Marital status: Married    Spouse name: [name]  . Number of children: N/A  . Years of education: N/A   Occupational History  . Driver    Social History Main Topics  . Smoking status: Never Smoker  . Smokeless tobacco: Never Used     Comment: tried a pipe 35 years ago   . Alcohol use No  . Drug use: No  . Sexual activity: Yes   Other Topics Concern  . Not on file   Social History Narrative   Married, wife Rosaria Ferries Nutritional therapist-   He works as a Geophysicist/field seismologist for a company  He has one son 43 yo. Lives with his wife   FAMILY HISTORY: Family History  Problem Relation Age of Onset  .  Heart disease Father   . Heart disease Brother 63  . Anesthesia problems Mother   . Heart disease Mother   . Dementia Mother   . Diabetes Sister   . Stroke Sister   . Colon cancer Neg Hx   . Rectal cancer Neg Hx   . Stomach cancer Neg Hx     ALLERGIES:  has No Known Allergies.  MEDICATIONS:  Current Outpatient Prescriptions  Medication Sig Dispense Refill  . amLODipine (NORVASC) 10 MG tablet TAKE 1 TABLET BY MOUTH EVERY DAY 30 tablet 6  . benazepril (LOTENSIN) 10 MG tablet TAKE 1 TABLET (10 MG TOTAL) BY MOUTH DAILY. 30 tablet 4  . capecitabine (XELODA) 500 MG tablet Take 4 tab in the morning and 3 tab in the evening, on the day of radiation only (Monday through Friday) 210 tablet 0  . FREESTYLE LITE test strip USE ONE STRIP TO CHECK GLUCOSE ONCE DAILY. PLEASE  SCHEDULE FOLLOW UP 50 each 0  . hyaluronate sodium (RADIAPLEXRX) GEL Apply 1 application topically daily.    . insulin aspart (NOVOLOG) 100 UNIT/ML injection Give every morning with breakfast and every evening with supper per the following sliding scale: give 3 units for glucose 150-175, give 6 units for glucose 176-200, give 9 units for glucose 201-250, give 12 units for glucose greater than 250 3 vial 11  . Insulin Detemir (LEVEMIR) 100 UNIT/ML Pen Inject 20 Units into the skin 2 (two) times daily. 90 mL 3  .  Insulin Pen Needle 30G X 5 MM MISC Use one daily with insulin 100 each 2  . JARDIANCE 25 MG TABS tablet TAKE 1 TABLET BY MOUTH EVERY DAY 90 tablet 1  . Lancets (FREESTYLE) lancets USE AS INSTRUCTED TO CHECK BLOOD SUGAR ONCE A DAY 100 each 2  . lidocaine-prilocaine (EMLA) cream Apply to affected area once 30 g 3  . Multiple Vitamin (MULTIVITAMIN WITH MINERALS) TABS tablet Take 1 tablet by mouth daily.    Marland Kitchen omega-3 acid ethyl esters (LOVAZA) 1 g capsule Take 2 g by mouth daily.    Marland Kitchen omeprazole (PRILOSEC) 20 MG capsule Take 20 mg by mouth daily.    . ondansetron (ZOFRAN) 8 MG tablet Take 1 tablet (8 mg total) by mouth 2 (two) times daily as needed for refractory nausea / vomiting. Start on day 3 after chemotherapy. 30 tablet 1  . oxyCODONE (OXY IR/ROXICODONE) 5 MG immediate release tablet Take 1-2 tablets (5-10 mg total) by mouth every 4 (four) hours as needed for moderate pain. 30 tablet 0  . polyethylene glycol (MIRALAX / GLYCOLAX) packet Take 17 g by mouth daily as needed for mild constipation. 14 each 0  . potassium chloride SA (K-DUR,KLOR-CON) 20 MEQ tablet 3 tabs today, 1 tab twice daily x 5 days, then 1 tab daily 40 tablet 1  . prochlorperazine (COMPAZINE) 10 MG tablet Take 1 tablet (10 mg total) by mouth every 6 (six) hours as needed (NAUSEA). 30 tablet 1   No current facility-administered medications for this visit.     REVIEW OF SYSTEMS:   Constitutional: Denies fevers, chills or abnormal night sweats Eyes: Denies blurriness of vision, double vision or watery eyes Ears, nose, mouth, throat, and face: Denies mucositis or sore throat Respiratory: Denies cough, dyspnea or wheezes Cardiovascular: Denies palpitation, chest discomfort or lower extremity swelling Gastrointestinal:  Denies nausea, heartburn or change in bowel habits Skin: Denies abnormal skin rashes Lymphatics: Denies new lymphadenopathy or easy bruising Neurological:Denies numbness, tingling or new  weaknesses Behavioral/Psych:  Mood is stable, no new changes  All other systems were reviewed with the patient and are negative.  PHYSICAL EXAMINATION: ECOG PERFORMANCE STATUS: 1  Vitals:   07/18/16 0810  BP: (!) 150/84  Pulse: 79  Resp: 18  Temp: 97.6 F (36.4 C)   Filed Weights   07/18/16 0810  Weight: 201 lb 4.8 oz (91.3 kg)    GENERAL:alert, no distress and comfortable SKIN: skin color, texture, turgor are normal, no rashes or significant lesions EYES: normal, conjunctiva are pink and non-injected, sclera clear OROPHARYNX:no exudate, no erythema and lips, buccal mucosa, and tongue normal  NECK: supple, thyroid normal size, non-tender, without nodularity LYMPH:  no palpable lymphadenopathy in the cervical, axillary or inguinal LUNGS: clear to auscultation and percussion with normal breathing effort HEART: regular rate & rhythm and no murmurs and no lower extremity edema ABDOMEN:abdomen soft, non-tender and normal bowel sounds Musculoskeletal:no cyanosis of digits and no clubbing  PSYCH: alert & oriented x 3 with fluent speech NEURO: no focal motor/sensory deficits  LABORATORY DATA:  I have reviewed the data as listed CBC Latest Ref Rng & Units 07/18/2016 07/11/2016 07/04/2016  WBC 4.0 - 10.3 10e3/uL 4.6 5.1 5.6  Hemoglobin 13.0 - 17.1 g/dL 12.0(L) 11.7(L) 12.0(L)  Hematocrit 38.4 - 49.9 % 36.2(L) 35.7(L) 36.7(L)  Platelets 140 - 400 10e3/uL 130(L) 146 178   CMP Latest Ref Rng & Units 07/11/2016 07/04/2016 06/27/2016  Glucose 70 - 140 mg/dl 73 54(L) 77  BUN 7.0 - 26.0 mg/dL 11.5 17.0 19.1  Creatinine 0.7 - 1.3 mg/dL 0.6(L) 0.6(L) 0.6(L)  Sodium 136 - 145 mEq/L 142 144 144  Potassium 3.5 - 5.1 mEq/L 3.6 2.9(LL) 4.0  Chloride 101 - 111 mmol/L - - -  CO2 22 - 29 mEq/L 26 26 28   Calcium 8.4 - 10.4 mg/dL 9.1 9.3 9.7  Total Protein 6.4 - 8.3 g/dL 7.3 7.5 7.6  Total Bilirubin 0.20 - 1.20 mg/dL 0.57 0.69 0.71  Alkaline Phos 40 - 150 U/L 107 119 123  AST 5 - 34 U/L 30 22 27    ALT 0 - 55 U/L 41 30 38   CA19.9 (0-35 U/ML) 02/28/2016: 1497 03/24/2016: 648 04/25/2016: 599 05/24/2016: 368  PATHOLOGY REPORT  Diagnosis 03/09/2016 FINE NEEDLE ASPIRATION, ENDOSCOPIC, PANCREAS (SPECIMEN 1 OF 1 COLLECTED 03/09/16): MALIGNANT CELLS CONSISTENT WITH ADENOCARCINOMA. BACKGROUND NECROTIC DEBRIS AND ACUTE INFLAMMATION.  Diagnosis 03/01/2016 BILE DUCT BRUSHING(SPECIMEN 1 OF 1 COLLECTED 03/01/16): RARE ATYPICAL CELLS, SEE COMMENT.  RADIOGRAPHIC STUDIES: I have personally reviewed the radiological images as listed and agreed with the findings in the report. No results found. EUS 03/09/2016 Dr. Ardis Hughs  - 3.5cm irregularly shaped, poorly defined mass in the pancreatic head. There is suspicious, nearby adenopathy. The mass is causing pancreatic and bililary duct obstruction (previously stented). The mass involves the portal vein for 77mm (abuttment and loss of usual tissue interface), strongly suggesting invasion. Preliminary cytology results are positive for malignancy (adenocarcinoma), await final report.  ASSESSMENT & PLAN: 60 year old Caucasian male, with past medical history of diabetes and hypertension, presented with epigastric pain, weight loss, and jaundice.  1. Primary pancreatic adenocarcinoma, in pancreas head, cT2N1M0, stage IIB, borderline resectable  -I have reviewed his CT abdomen and pelvis with contrast, EUS and biopsy findings with patient and his wife in great details. -His case was reviewed in our GI tumor board a few days ago. CT scan and US showed possible portal vein invasion from the pancreatic tumor, this is borderline resectable disease. No evidence of distant metastasis.  -  We reviewed the nature history of pancreatic cancer, and the overall survival rate with chemotherapy, surgery, and radiation. Patient and his wife was discouraged by the overall dismal long term survival rate (~20%)  -He was seen by surgeon Dr. Barry Dienes, Whipple surgery was discussed and  offered to patient.  -I reviewed the high risk of cancer recurrence after surgery, and the role of neoadjuvant and adjuvant chemotherapy to reduce the risk of recurrence, and shrink the tumor before surgery. -He has started neoadjuvant chemotherapy with FOLFIRINOX, tolerated well so far -We reviewed his restaging CT scan from 05/17/2016 in our GI tumor Board this morning, which showed a stable pancreatic mass, the tumor does not involve the SMA, but possible invading the port vein. -Neoadjuvant concurrent chemoradiation were recommended, after 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 with Dr. Barry Dienes and Dr. [name]. -He is tolerating concurrent chemoradiation well, lab results reviewed, mild thrombocytopenia, he will continue. He is planned to complete concurrent chemoradiation on 08/08/2016 -He has a follow-up appointment with Dr. Barry Dienes on 08/07/2016  2. Type 2 DM - he will continue medication and follow up with his primary care physician Dr. Sarajane Jews  -We reviewed that his blood glucose will need to be monitored closely during the chemotherapy, and his medication may need to be adjusted  -His blood glucose has been fluctuating lately, he sees a diabetic specialist and now, and checking his finger glucose 3 times a day, better controlled lately   3. HTN -He is on amlodipine and benazepril  -His blood pressure is normal today -The discussed chemotherapy may affect his blood pressure, and he is medication may need to be adjusted  Plan -He will continue concurrent chemoradiation with Xeloda 2000mg  in am, 1500mg  in evening, will finish on 1/9 -lab weekly  -I plan to see him back on 1/4   All questions were answered. The patient knows to call the clinic with any problems, questions or concerns.  I spent 15 minutes counseling the patient face to face. The total time spent in the appointment was 20 minutes and more than 50% was on counseling.     Truitt Merle, MD 07/18/2016

## 2016-07-19 ENCOUNTER — Ambulatory Visit
Admission: RE | Admit: 2016-07-19 | Discharge: 2016-07-19 | Disposition: A | Discharge status: Home / Self Care | Payer: 59 | Source: Ambulatory Visit | Attending: Radiation Oncology | Admitting: Radiation Oncology

## 2016-07-19 DIAGNOSIS — C250 Malignant neoplasm of head of pancreas: Secondary | ICD-10-CM | POA: Diagnosis not present

## 2016-07-19 LAB — CANCER ANTIGEN 19-9: CA 19-9: 51 U/mL — ABNORMAL HIGH (ref 0–35)

## 2016-07-20 ENCOUNTER — Ambulatory Visit
Admission: RE | Admit: 2016-07-20 | Discharge: 2016-07-20 | Disposition: A | Discharge status: Home / Self Care | Payer: 59 | Source: Ambulatory Visit | Attending: Radiation Oncology | Admitting: Radiation Oncology

## 2016-07-20 DIAGNOSIS — C250 Malignant neoplasm of head of pancreas: Secondary | ICD-10-CM | POA: Diagnosis not present

## 2016-07-21 ENCOUNTER — Ambulatory Visit
Admission: RE | Admit: 2016-07-21 | Discharge: 2016-07-21 | Disposition: A | Discharge status: Home / Self Care | Payer: 59 | Source: Ambulatory Visit | Attending: Radiation Oncology | Admitting: Radiation Oncology

## 2016-07-21 ENCOUNTER — Encounter: Payer: Self-pay | Admitting: Radiation Oncology

## 2016-07-21 VITALS — BP 152/79 | HR 75 | Temp 97.8°F | Resp 20 | Wt 201.8 lb

## 2016-07-21 DIAGNOSIS — C250 Malignant neoplasm of head of pancreas: Secondary | ICD-10-CM | POA: Diagnosis not present

## 2016-07-21 NOTE — Progress Notes (Signed)
Weekly rad txs pancreas, 18/28 completed,  No nausea, abdominal gas, or diarrhea, patient appetite good no fatigue or pain stated, no skin irritation on abdomen, not using radiaplex as yet 9:31 AM BP (!) 152/79 (BP Location: Right Arm, Patient Position: Sitting, Cuff Size: Normal)   Pulse 75   Temp 97.8 F (36.6 C) (Oral)   Resp 20   Wt 201 lb 12.8 oz (91.5 kg)   BMI 28.96 kg/m   Wt Readings from Last 3 Encounters:  07/21/16 201 lb 12.8 oz (91.5 kg)  07/18/16 201 lb 4.8 oz (91.3 kg)  07/13/16 202 lb 9.6 oz (91.9 kg)

## 2016-07-21 NOTE — Progress Notes (Signed)
Department of Radiation Oncology  Phone:  [phone] Fax:        [phone]  Weekly Treatment Note    Name: Colton Bush Date: 07/21/2016 MRN: [id number] DOB: 01-05-1956   Diagnosis:     ICD-9-CM ICD-10-CM   1. Adenocarcinoma of head of pancreas (HCC) 157.0 C25.0      Current dose: 32.4 Gy  Current fraction: 18   MEDICATIONS: Current Outpatient Prescriptions  Medication Sig Dispense Refill  . amLODipine (NORVASC) 10 MG tablet TAKE 1 TABLET BY MOUTH EVERY DAY 30 tablet 6  . benazepril (LOTENSIN) 10 MG tablet TAKE 1 TABLET (10 MG TOTAL) BY MOUTH DAILY. 30 tablet 4  . capecitabine (XELODA) 500 MG tablet Take 4 tab in the morning and 3 tab in the evening, on the day of radiation only (Monday through Friday) 210 tablet 0  . FREESTYLE LITE test strip USE ONE STRIP TO CHECK GLUCOSE ONCE DAILY. PLEASE  SCHEDULE FOLLOW UP 50 each 0  . hyaluronate sodium (RADIAPLEXRX) GEL Apply 1 application topically daily.    . insulin aspart (NOVOLOG) 100 UNIT/ML injection Give every morning with breakfast and every evening with supper per the following sliding scale: give 3 units for glucose 150-175, give 6 units for glucose 176-200, give 9 units for glucose 201-250, give 12 units for glucose greater than 250 3 vial 11  . Insulin Detemir (LEVEMIR) 100 UNIT/ML Pen Inject 20 Units into the skin 2 (two) times daily. 90 mL 3  . Insulin Pen Needle 30G X 5 MM MISC Use one daily with insulin 100 each 2  . JARDIANCE 25 MG TABS tablet TAKE 1 TABLET BY MOUTH EVERY DAY 90 tablet 1  . Lancets (FREESTYLE) lancets USE AS INSTRUCTED TO CHECK BLOOD SUGAR ONCE A DAY 100 each 2  . lidocaine-prilocaine (EMLA) cream Apply to affected area once 30 g 3  . Multiple Vitamin (MULTIVITAMIN WITH MINERALS) TABS tablet Take 1 tablet by mouth daily.    Marland Kitchen omega-3 acid ethyl esters (LOVAZA) 1 g capsule Take 2 g by mouth daily.    Marland Kitchen omeprazole (PRILOSEC) 20 MG capsule Take 20 mg by mouth daily.    . ondansetron (ZOFRAN) 8  MG tablet Take 1 tablet (8 mg total) by mouth 2 (two) times daily as needed for refractory nausea / vomiting. Start on day 3 after chemotherapy. 30 tablet 1  . oxyCODONE (OXY IR/ROXICODONE) 5 MG immediate release tablet Take 1-2 tablets (5-10 mg total) by mouth every 4 (four) hours as needed for moderate pain. 30 tablet 0  . polyethylene glycol (MIRALAX / GLYCOLAX) packet Take 17 g by mouth daily as needed for mild constipation. 14 each 0  . potassium chloride SA (K-DUR,KLOR-CON) 20 MEQ tablet 3 tabs today, 1 tab twice daily x 5 days, then 1 tab daily 40 tablet 1  . prochlorperazine (COMPAZINE) 10 MG tablet Take 1 tablet (10 mg total) by mouth every 6 (six) hours as needed (NAUSEA). 30 tablet 1   No current facility-administered medications for this encounter.      ALLERGIES: Patient has no known allergies.   LABORATORY DATA:  Lab Results  Component Value Date   WBC 4.6 07/18/2016   HGB 12.0 (L) 07/18/2016   HCT 36.2 (L) 07/18/2016   MCV 89.8 07/18/2016   PLT 130 (L) 07/18/2016   Lab Results  Component Value Date   NA 144 07/18/2016   K 4.0 07/18/2016   CL 102 03/21/2016   CO2 28 07/18/2016   Lab Results  Component Value Date   ALT 38 07/18/2016   AST 25 07/18/2016   ALKPHOS 108 07/18/2016   BILITOT 0.42 07/18/2016     NARRATIVE: Colton Bush was seen today for weekly treatment management. The chart was checked and the patient's films were reviewed.  He returns for weekly radiation treatment to the pancreas, 18/28 completed. The patient denies nausea, diarrhea, or gas. He reports a good appetite. Denies fatigue or pain.  PHYSICAL EXAMINATION: weight is 201 lb 12.8 oz (91.5 kg). His oral temperature is 97.8 F (36.6 C). His blood pressure is 152/79 (abnormal) and his pulse is 75. His respiration is 20.      Alert and in no acute distress.  ASSESSMENT: The patient is doing satisfactorily with treatment.  PLAN: We will continue with the patient's radiation treatment as  planned.   ------------------------------------------------   Tyler Pita, MD [facility] Director and Director of Stereotactic Radiosurgery Direct Dial: [phone]  Fax: [phone] [email or web address]  Skype  [email or web address]  This document serves as a record of services personally performed by Tyler Pita, MD. It was created on his behalf by Maryla Morrow, a trained medical scribe. The creation of this record is based on the scribe's personal observations and the provider's statements to them. This document has been checked and approved by the attending provider.

## 2016-07-25 ENCOUNTER — Ambulatory Visit
Admission: RE | Admit: 2016-07-25 | Discharge: 2016-07-25 | Disposition: A | Discharge status: Home / Self Care | Payer: 59 | Source: Ambulatory Visit | Attending: Radiation Oncology | Admitting: Radiation Oncology

## 2016-07-25 ENCOUNTER — Other Ambulatory Visit (HOSPITAL_BASED_OUTPATIENT_CLINIC_OR_DEPARTMENT_OTHER): Payer: 59

## 2016-07-25 DIAGNOSIS — C250 Malignant neoplasm of head of pancreas: Secondary | ICD-10-CM | POA: Diagnosis not present

## 2016-07-25 LAB — CBC WITH DIFFERENTIAL/PLATELET
BASO%: 0.6 % (ref 0.0–2.0)
Basophils Absolute: 0 10*3/uL (ref 0.0–0.1)
EOS%: 4 % (ref 0.0–7.0)
Eosinophils Absolute: 0.2 10*3/uL (ref 0.0–0.5)
HCT: 39.6 % (ref 38.4–49.9)
HGB: 12.9 g/dL — ABNORMAL LOW (ref 13.0–17.1)
LYMPH%: 5.7 % — ABNORMAL LOW (ref 14.0–49.0)
MCH: 30 pg (ref 27.2–33.4)
MCHC: 32.6 g/dL (ref 32.0–36.0)
MCV: 92.1 fL (ref 79.3–98.0)
MONO#: 0.6 10*3/uL (ref 0.1–0.9)
MONO%: 11.7 % (ref 0.0–14.0)
NEUT#: 4.1 10*3/uL (ref 1.5–6.5)
NEUT%: 78 % — ABNORMAL HIGH (ref 39.0–75.0)
Platelets: 153 10*3/uL (ref 140–400)
RBC: 4.3 10*6/uL (ref 4.20–5.82)
RDW: 17.9 % — ABNORMAL HIGH (ref 11.0–14.6)
WBC: 5.2 10*3/uL (ref 4.0–10.3)
lymph#: 0.3 10*3/uL — ABNORMAL LOW (ref 0.9–3.3)

## 2016-07-25 LAB — COMPREHENSIVE METABOLIC PANEL
ALT: 34 U/L (ref 0–55)
AST: 21 U/L (ref 5–34)
Albumin: 4 g/dL (ref 3.5–5.0)
Alkaline Phosphatase: 123 U/L (ref 40–150)
Anion Gap: 7 mEq/L (ref 3–11)
BUN: 13.4 mg/dL (ref 7.0–26.0)
CO2: 28 mEq/L (ref 22–29)
Calcium: 10 mg/dL (ref 8.4–10.4)
Chloride: 108 mEq/L (ref 98–109)
Creatinine: 0.7 mg/dL (ref 0.7–1.3)
EGFR: >90 mL/min/{1.73_m2} (ref >90)
Glucose: 132 mg/dl (ref 70–140)
Potassium: 5.4 mEq/L — ABNORMAL HIGH (ref 3.5–5.1)
Sodium: 143 mEq/L (ref 136–145)
Total Bilirubin: 0.4 mg/dL (ref 0.20–1.20)
Total Protein: 7.7 g/dL (ref 6.4–8.3)

## 2016-07-26 ENCOUNTER — Ambulatory Visit
Admission: RE | Admit: 2016-07-26 | Discharge: 2016-07-26 | Disposition: A | Discharge status: Home / Self Care | Payer: 59 | Source: Ambulatory Visit | Attending: Radiation Oncology | Admitting: Radiation Oncology

## 2016-07-26 DIAGNOSIS — C250 Malignant neoplasm of head of pancreas: Secondary | ICD-10-CM | POA: Diagnosis not present

## 2016-07-27 ENCOUNTER — Ambulatory Visit
Admission: RE | Admit: 2016-07-27 | Discharge: 2016-07-27 | Disposition: A | Discharge status: Home / Self Care | Payer: 59 | Source: Ambulatory Visit | Attending: Radiation Oncology | Admitting: Radiation Oncology

## 2016-07-27 DIAGNOSIS — C250 Malignant neoplasm of head of pancreas: Secondary | ICD-10-CM | POA: Diagnosis not present

## 2016-07-28 ENCOUNTER — Other Ambulatory Visit: Payer: Self-pay | Admitting: Endocrinology

## 2016-07-28 ENCOUNTER — Ambulatory Visit
Admission: RE | Admit: 2016-07-28 | Discharge: 2016-07-28 | Disposition: A | Discharge status: Home / Self Care | Payer: 59 | Source: Ambulatory Visit | Attending: Radiation Oncology | Admitting: Radiation Oncology

## 2016-07-28 ENCOUNTER — Telehealth: Payer: Self-pay | Admitting: Hematology

## 2016-07-28 ENCOUNTER — Other Ambulatory Visit: Payer: Self-pay | Admitting: Hematology

## 2016-07-28 ENCOUNTER — Encounter: Payer: Self-pay | Admitting: Radiation Oncology

## 2016-07-28 VITALS — BP 150/87 | HR 72 | Temp 98.2°F | Resp 12 | Wt 201.6 lb

## 2016-07-28 DIAGNOSIS — C250 Malignant neoplasm of head of pancreas: Secondary | ICD-10-CM

## 2016-07-28 NOTE — Progress Notes (Signed)
PAIN: He is currently in no pain.  BOWEL: Denies Nausea, Vomiting.  Reports an "upset stomach" last Friday,  He believes it was from his diet.  SKIN: No complaints, warm, dry, intact.   OTHER: He reports his blood sugar has been in the low 100's.   WEIGHT/VS: Wt Readings from Last 3 Encounters:  07/28/16 201 lb 9.6 oz (91.4 kg)  07/21/16 201 lb 12.8 oz (91.5 kg)  07/18/16 201 lb 4.8 oz (91.3 kg)   BP (!) 150/87   Pulse 72   Temp 98.2 F (36.8 C) (Oral)   Resp 12   Wt 201 lb 9.6 oz (91.4 kg)   SpO2 99%   BMI 28.93 kg/m

## 2016-07-28 NOTE — Progress Notes (Signed)
Department of Radiation Oncology  Phone:  [phone] Fax:        [phone]  Weekly Treatment Note    Name: Colton Bush Date: 07/28/2016 MRN: [id number] DOB: 1956-06-09   Diagnosis:     ICD-9-CM ICD-10-CM   1. Adenocarcinoma of head of pancreas (HCC) 157.0 C25.0      Current dose: 39.6 Gy  Current fraction: 22   MEDICATIONS: Current Outpatient Prescriptions  Medication Sig Dispense Refill  . amLODipine (NORVASC) 10 MG tablet TAKE 1 TABLET BY MOUTH EVERY DAY 30 tablet 6  . benazepril (LOTENSIN) 10 MG tablet TAKE 1 TABLET (10 MG TOTAL) BY MOUTH DAILY. 30 tablet 4  . capecitabine (XELODA) 500 MG tablet Take 4 tab in the morning and 3 tab in the evening, on the day of radiation only (Monday through Friday) 210 tablet 0  . FREESTYLE LITE test strip USE ONE STRIP TO CHECK GLUCOSE ONCE DAILY. PLEASE  SCHEDULE FOLLOW UP 50 each 0  . hyaluronate sodium (RADIAPLEXRX) GEL Apply 1 application topically daily.    . insulin aspart (NOVOLOG) 100 UNIT/ML injection Give every morning with breakfast and every evening with supper per the following sliding scale: give 3 units for glucose 150-175, give 6 units for glucose 176-200, give 9 units for glucose 201-250, give 12 units for glucose greater than 250 3 vial 11  . Insulin Detemir (LEVEMIR) 100 UNIT/ML Pen Inject 20 Units into the skin 2 (two) times daily. 90 mL 3  . Insulin Pen Needle 30G X 5 MM MISC Use one daily with insulin 100 each 2  . JARDIANCE 25 MG TABS tablet TAKE 1 TABLET BY MOUTH EVERY DAY 90 tablet 1  . Lancets (FREESTYLE) lancets USE AS INSTRUCTED TO CHECK BLOOD SUGAR ONCE A DAY 100 each 2  . lidocaine-prilocaine (EMLA) cream Apply to affected area once 30 g 3  . Multiple Vitamin (MULTIVITAMIN WITH MINERALS) TABS tablet Take 1 tablet by mouth daily.    Marland Kitchen omega-3 acid ethyl esters (LOVAZA) 1 g capsule Take 2 g by mouth daily.    Marland Kitchen omeprazole (PRILOSEC) 20 MG capsule Take 20 mg by mouth daily.    . ondansetron (ZOFRAN) 8  MG tablet Take 1 tablet (8 mg total) by mouth 2 (two) times daily as needed for refractory nausea / vomiting. Start on day 3 after chemotherapy. 30 tablet 1  . oxyCODONE (OXY IR/ROXICODONE) 5 MG immediate release tablet Take 1-2 tablets (5-10 mg total) by mouth every 4 (four) hours as needed for moderate pain. 30 tablet 0  . polyethylene glycol (MIRALAX / GLYCOLAX) packet Take 17 g by mouth daily as needed for mild constipation. 14 each 0  . potassium chloride SA (K-DUR,KLOR-CON) 20 MEQ tablet 3 tabs today, 1 tab twice daily x 5 days, then 1 tab daily 40 tablet 1  . prochlorperazine (COMPAZINE) 10 MG tablet Take 1 tablet (10 mg total) by mouth every 6 (six) hours as needed (NAUSEA). 30 tablet 1   No current facility-administered medications for this encounter.      ALLERGIES: Patient has no known allergies.   LABORATORY DATA:  Lab Results  Component Value Date   WBC 5.2 07/25/2016   HGB 12.9 (L) 07/25/2016   HCT 39.6 07/25/2016   MCV 92.1 07/25/2016   PLT 153 07/25/2016   Lab Results  Component Value Date   NA 143 07/25/2016   K 5.4 (H) 07/25/2016   CL 102 03/21/2016   CO2 28 07/25/2016   Lab Results  Component Value Date   ALT 34 07/25/2016   AST 21 07/25/2016   ALKPHOS 123 07/25/2016   BILITOT 0.40 07/25/2016     NARRATIVE: MHER JENTSCH was seen today for weekly treatment management. The chart was checked and the patient's films were reviewed.  He returns for weekly radiation treatment to the pancreas, 22/28 completed. He denies pain currently. He denies nausea, vomiting, though he reports an "upset stomach" last Friday, which he believes was from his diet. He reports his blood sugar has been in the low 100's.  PHYSICAL EXAMINATION: weight is 201 lb 9.6 oz (91.4 kg). His oral temperature is 98.2 F (36.8 C). His blood pressure is 150/87 (abnormal) and his pulse is 72. His respiration is 12 and oxygen saturation is 99%.      Alert and in no acute distress.  ASSESSMENT:  The patient is doing satisfactorily with treatment.  PLAN: We will continue with the patient's radiation treatment as planned.   ------------------------------------------------  Jodelle Gross, MD, PhD  This document serves as a record of services personally performed by Kyung Rudd, MD. It was created on his behalf by Maryla Morrow, a trained medical scribe. The creation of this record is based on the scribe's personal observations and the provider's statements to them. This document has been checked and approved by the attending provider.

## 2016-07-28 NOTE — Telephone Encounter (Signed)
Added f/u 1/4. Left message for patient. Also confirmed 1/2 lab.

## 2016-07-30 ENCOUNTER — Other Ambulatory Visit: Payer: Self-pay | Admitting: Family Medicine

## 2016-08-01 ENCOUNTER — Ambulatory Visit
Admission: RE | Admit: 2016-08-01 | Discharge: 2016-08-01 | Disposition: A | Discharge status: Home / Self Care | Payer: 59 | Source: Ambulatory Visit | Attending: Radiation Oncology | Admitting: Radiation Oncology

## 2016-08-01 ENCOUNTER — Telehealth: Payer: Self-pay | Admitting: *Deleted

## 2016-08-01 ENCOUNTER — Other Ambulatory Visit (HOSPITAL_BASED_OUTPATIENT_CLINIC_OR_DEPARTMENT_OTHER): Payer: 59

## 2016-08-01 DIAGNOSIS — C250 Malignant neoplasm of head of pancreas: Secondary | ICD-10-CM

## 2016-08-01 LAB — CBC WITH DIFFERENTIAL/PLATELET
BASO%: 0.8 % (ref 0.0–2.0)
Basophils Absolute: 0 10*3/uL (ref 0.0–0.1)
EOS%: 4.1 % (ref 0.0–7.0)
Eosinophils Absolute: 0.2 10*3/uL (ref 0.0–0.5)
HCT: 38.6 % (ref 38.4–49.9)
HGB: 12.6 g/dL — ABNORMAL LOW (ref 13.0–17.1)
LYMPH%: 8.3 % — ABNORMAL LOW (ref 14.0–49.0)
MCH: 29.7 pg (ref 27.2–33.4)
MCHC: 32.6 g/dL (ref 32.0–36.0)
MCV: 91 fL (ref 79.3–98.0)
MONO#: 0.5 10*3/uL (ref 0.1–0.9)
MONO%: 9.8 % (ref 0.0–14.0)
NEUT#: 3.9 10*3/uL (ref 1.5–6.5)
NEUT%: 77 % — ABNORMAL HIGH (ref 39.0–75.0)
Platelets: 162 10*3/uL (ref 140–400)
RBC: 4.24 10*6/uL (ref 4.20–5.82)
RDW: 17.7 % — ABNORMAL HIGH (ref 11.0–14.6)
WBC: 5.1 10*3/uL (ref 4.0–10.3)
lymph#: 0.4 10*3/uL — ABNORMAL LOW (ref 0.9–3.3)

## 2016-08-01 LAB — COMPREHENSIVE METABOLIC PANEL
ALT: 34 U/L (ref 0–55)
AST: 24 U/L (ref 5–34)
Albumin: 3.9 g/dL (ref 3.5–5.0)
Alkaline Phosphatase: 124 U/L (ref 40–150)
Anion Gap: 10 mEq/L (ref 3–11)
BUN: 10.7 mg/dL (ref 7.0–26.0)
CO2: 26 mEq/L (ref 22–29)
Calcium: 9.5 mg/dL (ref 8.4–10.4)
Chloride: 107 mEq/L (ref 98–109)
Creatinine: 0.6 mg/dL — ABNORMAL LOW (ref 0.7–1.3)
EGFR: >90 mL/min/{1.73_m2} (ref >90)
Glucose: 96 mg/dl (ref 70–140)
Potassium: 3.9 mEq/L (ref 3.5–5.1)
Sodium: 143 mEq/L (ref 136–145)
Total Bilirubin: 0.35 mg/dL (ref 0.20–1.20)
Total Protein: 7.6 g/dL (ref 6.4–8.3)

## 2016-08-01 NOTE — Telephone Encounter (Signed)
Left message for pt to call regarding xeloda script tomorrow.

## 2016-08-02 ENCOUNTER — Ambulatory Visit
Admission: RE | Admit: 2016-08-02 | Discharge: 2016-08-02 | Disposition: A | Discharge status: Home / Self Care | Payer: 59 | Source: Ambulatory Visit | Attending: Radiation Oncology | Admitting: Radiation Oncology

## 2016-08-02 DIAGNOSIS — C250 Malignant neoplasm of head of pancreas: Secondary | ICD-10-CM | POA: Diagnosis not present

## 2016-08-03 ENCOUNTER — Encounter: Payer: Self-pay | Admitting: Hematology

## 2016-08-03 ENCOUNTER — Encounter: Payer: Self-pay | Admitting: Radiation Oncology

## 2016-08-03 ENCOUNTER — Telehealth: Payer: Self-pay | Admitting: Hematology

## 2016-08-03 ENCOUNTER — Ambulatory Visit
Admission: RE | Admit: 2016-08-03 | Discharge: 2016-08-03 | Disposition: A | Discharge status: Home / Self Care | Payer: 59 | Source: Ambulatory Visit | Attending: Radiation Oncology | Admitting: Radiation Oncology

## 2016-08-03 ENCOUNTER — Ambulatory Visit (HOSPITAL_BASED_OUTPATIENT_CLINIC_OR_DEPARTMENT_OTHER): Payer: 59 | Admitting: Hematology

## 2016-08-03 ENCOUNTER — Ambulatory Visit: Admission: RE | Admit: 2016-08-03 | Payer: 59 | Source: Ambulatory Visit | Admitting: Radiation Oncology

## 2016-08-03 ENCOUNTER — Ambulatory Visit: Payer: 59

## 2016-08-03 VITALS — BP 145/79 | HR 80 | Temp 98.7°F | Resp 18 | Wt 204.3 lb

## 2016-08-03 DIAGNOSIS — I10 Essential (primary) hypertension: Secondary | ICD-10-CM | POA: Diagnosis not present

## 2016-08-03 DIAGNOSIS — Z794 Long term (current) use of insulin: Secondary | ICD-10-CM

## 2016-08-03 DIAGNOSIS — IMO0001 Reserved for inherently not codable concepts without codable children: Secondary | ICD-10-CM

## 2016-08-03 DIAGNOSIS — C250 Malignant neoplasm of head of pancreas: Secondary | ICD-10-CM

## 2016-08-03 DIAGNOSIS — E1165 Type 2 diabetes mellitus with hyperglycemia: Secondary | ICD-10-CM | POA: Diagnosis not present

## 2016-08-03 NOTE — Telephone Encounter (Signed)
Appointments scheduled per 08/03/16 los. Patient was given a copy of the AVS report and appointment schedule, per 08/03/16.

## 2016-08-03 NOTE — Progress Notes (Signed)
[name]  [phone] [phone]  Clinic Follow up Note   Patient Care Team: Colton Morale, MD as PCP - General 08/03/2016  CHIEF COMPLAINTS:  Follow up pancreatic cancer  Oncology History   Adenocarcinoma of head of pancreas [facility])   Staging form: Pancreas, AJCC 7th Edition   - Clinical stage from 03/09/2016: Stage IIB (T2, N1, M0) - Signed by Colton Merle, MD on 03/17/2016      Adenocarcinoma of head of pancreas ([place])   02/27/2016 Imaging    CT chest, abdomen and pelvis with contrast showed ill-defined heterogeneity of pancreatic head, highly suspicious for malignancy, ) quit about and biliary ductal dilatation, mildly prominent lymph nodes in the upper abdomen, largest 2 cm in the portocaval . No other metastasis.       03/09/2016 Initial Diagnosis    Adenocarcinoma of head of pancreas ([place])      03/09/2016 Procedure    Upper EUS showed a 3.5 cm irregular mass in the pancreatic head, causing pancreatic and biliary duct obstruction. The mass involves the portal vein 422 mm, strongly suggesting invasion. There is suspicious nearby adenopathy      03/09/2016 Initial Biopsy    Fine-needle aspiration of the pancreatic mass from EUS showed malignant cells consistent with adenocarcinoma.       03/28/2016 - 05/24/2016 Chemotherapy    neoadjuvant chemo with FOLFIRINOX every 2 weeks      06/28/2016 -  Radiation Therapy     neoadjuvant radiation      06/28/2016 -  Chemotherapy    Xeloda 2000 mg in the morning and 1500 mg in the evening, on the day of radiation.       HISTORY OF PRESENTING ILLNESS:  Colton Bush 61 y.o. male is here because of His newly diagnosed pancreatic cancer. He is accompanied by his wife to our multidisciplinary GI clinic today.  He noticed fatigue, anorexia and weight loss about 50bs in the past 4 months. It started when his endocrinologist changed his diabetic meds. He presented to hospital on 7/30 with jaundice. No  pain or nausea. CT scan revealed a heterogeneous pancreatic head mass, highly suspicious for malignancy. Mild biliary and pancreatic duct dilatation, mildly prominent lymph nodes in the upper abdomen. He underwent ERCP and CBD stent placement, and subsequent upper EUS on 03/09/2016, which showed a 3.5 cm irregular mass in the pancreatic head, the mass involves the portal vein, no suspicious nearby adenopathy.  He has been feeling better after stent placement, with improved appetite and energy level. He feels normal again. No symptoms. BM is good, he has gained about 8lbs back.   CURRENT THERAPY: concurrent chemotherapy and irradiation with Xeloda 2000mg  am and 1500mg  pm   INTERIM HISTORY: Colton Bush returns for follow up. He has been tolerating concurrent chemotherapy and radiation very well, he will finish next Tuesday. He denies any significant pain, nausea, or other side effects. His blood glucose has been much improved lately, he feels very well overall. 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  MEDICAL HISTORY:  Past Medical History:  Diagnosis Date  . Arthritis    left hand  . Bronchitis 1977  . Cancer ([place]) 03/09/2016   pancreatic cancer  . Depression    takes Cymbalta daily  . Diabetes mellitus type II    sees Dr. Dwyane Bush   . GERD (gastroesophageal reflux disease)    takes Omeprazole daily  . H/O hiatal hernia   .  Hyperlipidemia    takes Zocor daily  . Hypertension    takes Amlodipine daily  . Neck pain    C4-7 stenosis and herniated disc  . Neuromuscular disorder ([place])    hiatal hernia  . Scoliosis    slight  . Spinal cord injury, C5-C7 ([place])    c4-c7  . Stiffness of hand joint    d/t cervical issues    SURGICAL HISTORY: Past Surgical History:  Procedure Laterality Date  . ANTERIOR CERVICAL DECOMP/DISCECTOMY FUSION  08/18/2011   Procedure: ANTERIOR CERVICAL DECOMPRESSION/DISCECTOMY FUSION 3 LEVELS;  Surgeon: Winfield Cunas,  MD;  Location: [facility];  Service: Neurosurgery;  Laterality: N/A;  Anterior Cervical Four-Five/Five-Six/Six-Seven Decompression with Fusion, Plating, and Bonegraft  . CARPAL TUNNEL RELEASE  2013   bilateral, per Dr. Christella Noa   . COLONOSCOPY  10-30-14   per Dr. Olevia Perches, clear, repeat in 10 yrs   . egd with esophageal dilation  9-08   per Dr. Olevia Perches  . ERCP N/A 03/01/2016   Procedure: ENDOSCOPIC RETROGRADE CHOLANGIOPANCREATOGRAPHY (ERCP) with brushings and stent;  Surgeon: Doran Stabler, MD;  Location: [facility];  Service: Endoscopy;  Laterality: N/A;  . EUS N/A 03/09/2016   Procedure: ESOPHAGEAL ENDOSCOPIC ULTRASOUND (EUS) RADIAL;  Surgeon: Milus Banister, MD;  Location: [facility];  Service: Endoscopy;  Laterality: N/A;  . lymph nodes biopsy    . melanoma rt calf  1999  . PORTACATH PLACEMENT Left 03/22/2016   Procedure: INSERTION PORT-A-CATH;  Surgeon: Stark Klein, MD;  Location: [facility];  Service: General;  Laterality: Left;  . SPINE SURGERY    . TONSILLECTOMY     as a child  . ULNAR TUNNEL RELEASE  2013   right arm, per Dr. Christella Noa   . UPPER GASTROINTESTINAL ENDOSCOPY      SOCIAL HISTORY: Social History   Social History  . Marital status: Married    Spouse name: Colton Bush  . Number of children: N/A  . Years of education: N/A   Occupational History  . Driver    Social History Main Topics  . Smoking status: Never Smoker  . Smokeless tobacco: Never Used     Comment: tried a pipe 35 years ago   . Alcohol use No  . Drug use: No  . Sexual activity: Yes   Other Topics Concern  . 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 Lives with his wife   FAMILY HISTORY: Family History  Problem Relation Age of Onset  . Heart disease Father   . Heart disease Brother 38  . Anesthesia problems Mother   . Heart disease Mother   . Dementia Mother   . Diabetes Sister   . Stroke  Sister   . Colon cancer Neg Hx   . Rectal cancer Neg Hx   . Stomach cancer Neg Hx     ALLERGIES:  has No Known Allergies.  MEDICATIONS:  Current Outpatient Prescriptions  Medication Sig Dispense Refill  . amLODipine (NORVASC) 10 MG tablet TAKE 1 TABLET BY MOUTH EVERY DAY 30 tablet 6  . benazepril (LOTENSIN) 10 MG tablet TAKE 1 TABLET (10 MG TOTAL) BY MOUTH DAILY. 30 tablet 3  . capecitabine (XELODA) 500 MG tablet Take 4 tab in the morning and 3 tab in the evening, on the day of radiation only (Monday through Friday) 210 tablet  0  . FREESTYLE LITE test strip USE ONE STRIP TO CHECK GLUCOSE ONCE DAILY. PLEASE  SCHEDULE FOLLOW UP 50 each 0  . insulin aspart (NOVOLOG) 100 UNIT/ML injection Give every morning with breakfast and every evening with supper per the following sliding scale: give 3 units for glucose 150-175, give 6 units for glucose 176-200, give 9 units for glucose 201-250, give 12 units for glucose greater than 250 3 vial 11  . Insulin Detemir (LEVEMIR) 100 UNIT/ML Pen Inject 20 Units into the skin 2 (two) times daily. 90 mL 3  . Insulin Pen Needle 30G X 5 MM MISC Use one daily with insulin 100 each 2  . JARDIANCE 25 MG TABS tablet TAKE 1 TABLET BY MOUTH EVERY DAY 90 tablet 1  . Lancets (FREESTYLE) lancets USE AS INSTRUCTED TO CHECK BLOOD SUGAR ONCE A DAY 100 each 2  . lidocaine-prilocaine (EMLA) cream Apply to affected area once 30 g 3  . Multiple Vitamin (MULTIVITAMIN WITH MINERALS) TABS tablet Take 1 tablet by mouth daily.    Marland Kitchen omega-3 acid ethyl esters (LOVAZA) 1 g capsule Take 2 g by mouth daily.    Marland Kitchen omeprazole (PRILOSEC) 20 MG capsule Take 20 mg by mouth daily.    . potassium chloride SA (K-DUR,KLOR-CON) 20 MEQ tablet 3 tabs today, 1 tab twice daily x 5 days, then 1 tab daily 40 tablet 1  . hyaluronate sodium (RADIAPLEXRX) GEL Apply 1 application topically daily.    . ondansetron (ZOFRAN) 8 MG tablet Take 1 tablet (8 mg total) by mouth 2 (two) times daily as needed for  refractory nausea / vomiting. Start on day 3 after chemotherapy. (Patient not taking: Reported on 08/03/2016) 30 tablet 1  . oxyCODONE (OXY IR/ROXICODONE) 5 MG immediate release tablet Take 1-2 tablets (5-10 mg total) by mouth every 4 (four) hours as needed for moderate pain. (Patient not taking: Reported on 08/03/2016) 30 tablet 0  . polyethylene glycol (MIRALAX / GLYCOLAX) packet Take 17 g by mouth daily as needed for mild constipation. (Patient not taking: Reported on 08/03/2016) 14 each 0  . prochlorperazine (COMPAZINE) 10 MG tablet Take 1 tablet (10 mg total) by mouth every 6 (six) hours as needed (NAUSEA). (Patient not taking: Reported on 08/03/2016) 30 tablet 1   No current facility-administered medications for this visit.     REVIEW OF SYSTEMS:   Constitutional: Denies fevers, chills or abnormal night sweats Eyes: Denies blurriness of vision, double vision or watery eyes Ears, nose, mouth, throat, and face: Denies mucositis or sore throat Respiratory: Denies cough, dyspnea or wheezes Cardiovascular: Denies palpitation, chest discomfort or lower extremity swelling Gastrointestinal:  Denies nausea, heartburn or change in bowel habits Skin: Denies abnormal skin rashes Lymphatics: Denies new lymphadenopathy or easy bruising Neurological:Denies numbness, tingling or new weaknesses Behavioral/Psych: Mood is stable, no new changes  All other systems were reviewed with the patient and are negative.  PHYSICAL EXAMINATION: ECOG PERFORMANCE STATUS: 1  Vitals:   08/03/16 0829  BP: (!) 145/79  Pulse: 80  Resp: 18  Temp: 98.7 F (37.1 C)   Filed Weights   08/03/16 0829  Weight: 204 lb 4.8 oz (92.7 kg)    GENERAL:alert, no distress and comfortable SKIN: skin color, texture, turgor are normal, no rashes or significant lesions EYES: normal, conjunctiva are pink and non-injected, sclera clear OROPHARYNX:no exudate, no erythema and lips, buccal mucosa, and tongue normal  NECK: supple, thyroid  normal size, non-tender, without nodularity LYMPH:  no palpable lymphadenopathy in the cervical,  axillary or inguinal LUNGS: clear to auscultation and percussion with normal breathing effort HEART: regular rate & rhythm and no murmurs and no lower extremity edema ABDOMEN:abdomen soft, non-tender and normal bowel sounds Musculoskeletal:no cyanosis of digits and no clubbing  PSYCH: alert & oriented x 3 with fluent speech NEURO: no focal motor/sensory deficits  LABORATORY DATA:  I have reviewed the data as listed CBC Latest Ref Rng & Units 08/01/2016 07/25/2016 07/18/2016  WBC 4.0 - 10.3 10e3/uL 5.1 5.2 4.6  Hemoglobin 13.0 - 17.1 g/dL 12.6(L) 12.9(L) 12.0(L)  Hematocrit 38.4 - 49.9 % 38.6 39.6 36.2(L)  Platelets 140 - 400 10e3/uL 162 153 130(L)   CMP Latest Ref Rng & Units 08/01/2016 07/25/2016 07/18/2016  Glucose 70 - 140 mg/dl 96 132 63(L)  BUN 7.0 - 26.0 mg/dL 10.7 13.4 12.4  Creatinine 0.7 - 1.3 mg/dL 0.6(L) 0.7 0.6(L)  Sodium 136 - 145 mEq/L 143 143 144  Potassium 3.5 - 5.1 mEq/L 3.9 5.4(H) 4.0  Chloride 101 - 111 mmol/L - - -  CO2 22 - 29 mEq/L 26 28 28   Calcium 8.4 - 10.4 mg/dL 9.5 10.0 9.4  Total Protein 6.4 - 8.3 g/dL 7.6 7.7 7.3  Total Bilirubin 0.20 - 1.20 mg/dL 0.35 0.40 0.42  Alkaline Phos 40 - 150 U/L 124 123 108  AST 5 - 34 U/L 24 21 25   ALT 0 - 55 U/L 34 34 38   CA19.9 (0-35 U/ML) 02/28/2016: 1497 03/24/2016: 648 04/25/2016: 599 05/24/2016: 368 07/18/2016: 51  PATHOLOGY REPORT  Diagnosis 03/09/2016 FINE NEEDLE ASPIRATION, ENDOSCOPIC, PANCREAS (SPECIMEN 1 OF 1 COLLECTED 03/09/16): MALIGNANT CELLS CONSISTENT WITH ADENOCARCINOMA. BACKGROUND NECROTIC DEBRIS AND ACUTE INFLAMMATION.  Diagnosis 03/01/2016 BILE DUCT BRUSHING(SPECIMEN 1 OF 1 COLLECTED 03/01/16): RARE ATYPICAL CELLS, SEE COMMENT.  RADIOGRAPHIC STUDIES: I have personally reviewed the radiological images as listed and agreed with the findings in the report. No results found. EUS 03/09/2016 Dr. Ardis Hughs  - 3.5cm  irregularly shaped, poorly defined mass in the pancreatic head. There is suspicious, nearby adenopathy. The mass is causing pancreatic and bililary duct obstruction (previously stented). The mass involves the portal vein for 4mm (abuttment and loss of usual tissue interface), strongly suggesting invasion. Preliminary cytology results are positive for malignancy (adenocarcinoma), await final report.  ASSESSMENT & PLAN: 61 year old Caucasian male, with past medical history of diabetes and hypertension, presented with epigastric pain, weight loss, and jaundice.  1. Primary pancreatic adenocarcinoma, in pancreas head, cT2N1M0, stage IIB, borderline resectable  -I have reviewed his CT abdomen and pelvis with contrast, EUS and biopsy findings with patient and his wife in great details. -His case was reviewed in our GI tumor board a few days ago. CT scan and US showed possible portal vein invasion from the pancreatic tumor, this is borderline resectable disease. No evidence of distant metastasis.  -We reviewed the nature history of pancreatic cancer, and the overall survival rate with chemotherapy, surgery, and radiation. Patient and his wife was discouraged by the overall dismal long term survival rate (~20%)  -He was seen by surgeon Dr. Barry Dienes, Whipple surgery was discussed and offered to patient.  -I reviewed the high risk of cancer recurrence after surgery, and the role of neoadjuvant and adjuvant chemotherapy to reduce the risk of recurrence, and shrink the tumor before surgery. -He has started neoadjuvant chemotherapy with FOLFIRINOX, tolerated well so far -We reviewed his restaging CT scan from 05/17/2016 in our GI tumor Board this morning, which showed a stable pancreatic mass, the tumor does not involve the  SMA, but possible invading the port vein. -Neoadjuvant concurrent chemoradiation were recommended, after 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 with Dr. Barry Dienes and Dr. [name]. -He is tolerating concurrent chemoradiation  well, lab results reviewed, mild anemia, no other abnormalities,  he will continue. He is planned to complete concurrent chemoradiation on 08/08/2016 -He has a follow-up appointment with Dr. Barry Dienes on 08/07/2016  2. Type 2 DM - he will continue medication and follow up with his primary care physician Dr. Sarajane Jews  -We reviewed that his blood glucose will need to be monitored closely during the chemotherapy, and his medication may need to be adjusted  -His blood glucose has been fluctuating lately, he sees a diabetic specialist and now, and checking his finger glucose 3 times a day, better controlled lately   3. HTN -He is on amlodipine and benazepril  -His blood pressure is normal today -The discussed chemotherapy may affect his blood pressure, and he is medication may need to be adjusted  Plan -He will continue concurrent chemoradiation with Xeloda 2000mg  in am, 1500mg  in evening, will finish on 1/9 -lab weekly  -I plan to see him back in 3 weeks -He will see Dr. Barry Dienes next Monday   All questions were answered. The patient knows to call the clinic with any problems, questions or concerns.  I spent 15 minutes counseling the patient face to face. The total time spent in the appointment was 20 minutes and more than 50% was on counseling.     Colton Merle, MD 08/03/2016

## 2016-08-04 ENCOUNTER — Ambulatory Visit: Payer: Self-pay | Admitting: Hematology

## 2016-08-04 ENCOUNTER — Ambulatory Visit
Admission: RE | Admit: 2016-08-04 | Discharge: 2016-08-04 | Disposition: A | Discharge status: Home / Self Care | Payer: 59 | Source: Ambulatory Visit | Attending: Radiation Oncology | Admitting: Radiation Oncology

## 2016-08-04 ENCOUNTER — Ambulatory Visit: Payer: 59

## 2016-08-04 ENCOUNTER — Encounter: Payer: Self-pay | Admitting: Radiation Oncology

## 2016-08-04 DIAGNOSIS — C250 Malignant neoplasm of head of pancreas: Secondary | ICD-10-CM | POA: Diagnosis not present

## 2016-08-04 NOTE — Progress Notes (Signed)
Weekly rad txs pancreas 26/30 completed, no pain, no nausea, no gas , regular bowels, bladder normal appetite good, no fatigue, no skin irritation 9:05 AM BP 138/64 (BP Location: Right Arm, Patient Position: Sitting, Cuff Size: Normal)   Pulse 69   Temp 97.7 F (36.5 C) (Oral)   Resp 20   Wt 202 lb 6.4 oz (91.8 kg)   BMI 29.04 kg/m

## 2016-08-07 ENCOUNTER — Telehealth: Payer: Self-pay | Admitting: Radiation Oncology

## 2016-08-07 ENCOUNTER — Ambulatory Visit
Admission: RE | Admit: 2016-08-07 | Discharge: 2016-08-07 | Disposition: A | Discharge status: Home / Self Care | Payer: 59 | Source: Ambulatory Visit | Attending: Radiation Oncology | Admitting: Radiation Oncology

## 2016-08-07 DIAGNOSIS — C250 Malignant neoplasm of head of pancreas: Secondary | ICD-10-CM | POA: Diagnosis not present

## 2016-08-07 NOTE — Telephone Encounter (Signed)
Dr. Barry Dienes called and 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 the patient would complete treatment this Thursday. She's planning surgery 6-8 weeks from now.

## 2016-08-08 ENCOUNTER — Ambulatory Visit
Admission: RE | Admit: 2016-08-08 | Discharge: 2016-08-08 | Disposition: A | Discharge status: Home / Self Care | Payer: 59 | Source: Ambulatory Visit | Attending: Radiation Oncology | Admitting: Radiation Oncology

## 2016-08-08 ENCOUNTER — Other Ambulatory Visit: Payer: Self-pay | Admitting: General Surgery

## 2016-08-08 ENCOUNTER — Ambulatory Visit: Payer: 59

## 2016-08-08 DIAGNOSIS — C250 Malignant neoplasm of head of pancreas: Secondary | ICD-10-CM | POA: Diagnosis not present

## 2016-08-09 ENCOUNTER — Encounter: Payer: Self-pay | Admitting: Radiation Oncology

## 2016-08-09 ENCOUNTER — Ambulatory Visit
Admission: RE | Admit: 2016-08-09 | Discharge: 2016-08-09 | Disposition: A | Discharge status: Home / Self Care | Payer: 59 | Source: Ambulatory Visit | Attending: Radiation Oncology | Admitting: Radiation Oncology

## 2016-08-09 VITALS — BP 143/81 | Temp 98.0°F | Resp 16 | Wt 203.0 lb

## 2016-08-09 DIAGNOSIS — C250 Malignant neoplasm of head of pancreas: Secondary | ICD-10-CM | POA: Diagnosis not present

## 2016-08-09 NOTE — Progress Notes (Signed)
Department of Radiation Oncology  Phone:  [phone] Fax:        [phone]  Weekly Treatment Note    Name: Colton Bush Date: 08/09/2016 MRN: [id number] DOB: 01-May-1956   Diagnosis:     ICD-9-CM ICD-10-CM   1. Adenocarcinoma of head of pancreas ([facility]) 157.0 C25.0      Current dose: 52.2 Gy  Current fraction: 29   MEDICATIONS: Current Outpatient Prescriptions  Medication Sig Dispense Refill  . amLODipine (NORVASC) 10 MG tablet TAKE 1 TABLET BY MOUTH EVERY DAY 30 tablet 6  . benazepril (LOTENSIN) 10 MG tablet TAKE 1 TABLET (10 MG TOTAL) BY MOUTH DAILY. 30 tablet 3  . capecitabine (XELODA) 500 MG tablet Take 4 tab in the morning and 3 tab in the evening, on the day of radiation only (Monday through Friday) 210 tablet 0  . FREESTYLE LITE test strip USE ONE STRIP TO CHECK GLUCOSE ONCE DAILY. PLEASE  SCHEDULE FOLLOW UP 50 each 0  . hyaluronate sodium (RADIAPLEXRX) GEL Apply 1 application topically daily.    . insulin aspart (NOVOLOG) 100 UNIT/ML injection Give every morning with breakfast and every evening with supper per the following sliding scale: give 3 units for glucose 150-175, give 6 units for glucose 176-200, give 9 units for glucose 201-250, give 12 units for glucose greater than 250 3 vial 11  . Insulin Detemir (LEVEMIR) 100 UNIT/ML Pen Inject 20 Units into the skin 2 (two) times daily. 90 mL 3  . Insulin Pen Needle 30G X 5 MM MISC Use one daily with insulin 100 each 2  . JARDIANCE 25 MG TABS tablet TAKE 1 TABLET BY MOUTH EVERY DAY 90 tablet 1  . Lancets (FREESTYLE) lancets USE AS INSTRUCTED TO CHECK BLOOD SUGAR ONCE A DAY 100 each 2  . lidocaine-prilocaine (EMLA) cream Apply to affected area once 30 g 3  . Multiple Vitamin (MULTIVITAMIN WITH MINERALS) TABS tablet Take 1 tablet by mouth daily.    Marland Kitchen omega-3 acid ethyl esters (LOVAZA) 1 g capsule Take 2 g by mouth daily.    Marland Kitchen omeprazole (PRILOSEC) 20 MG capsule Take 20 mg by mouth daily.    . ondansetron (ZOFRAN) 8  MG tablet Take 1 tablet (8 mg total) by mouth 2 (two) times daily as needed for refractory nausea / vomiting. Start on day 3 after chemotherapy. 30 tablet 1  . oxyCODONE (OXY IR/ROXICODONE) 5 MG immediate release tablet Take 1-2 tablets (5-10 mg total) by mouth every 4 (four) hours as needed for moderate pain. 30 tablet 0  . polyethylene glycol (MIRALAX / GLYCOLAX) packet Take 17 g by mouth daily as needed for mild constipation. 14 each 0  . potassium chloride SA (K-DUR,KLOR-CON) 20 MEQ tablet 3 tabs today, 1 tab twice daily x 5 days, then 1 tab daily 40 tablet 1  . prochlorperazine (COMPAZINE) 10 MG tablet Take 1 tablet (10 mg total) by mouth every 6 (six) hours as needed (NAUSEA). 30 tablet 1   No current facility-administered medications for this encounter.      ALLERGIES: Patient has no known allergies.   LABORATORY DATA:  Lab Results  Component Value Date   WBC 5.1 08/01/2016   HGB 12.6 (L) 08/01/2016   HCT 38.6 08/01/2016   MCV 91.0 08/01/2016   PLT 162 08/01/2016   Lab Results  Component Value Date   NA 143 08/01/2016   K 3.9 08/01/2016   CL 102 03/21/2016   CO2 26 08/01/2016   Lab Results  Component  Value Date   ALT 34 08/01/2016   AST 24 08/01/2016   ALKPHOS 124 08/01/2016   BILITOT 0.35 08/01/2016     NARRATIVE: Colton Bush was seen today for weekly treatment management. The chart was checked and the patient's films were reviewed.  He returns for weekly radiation treatment to the pancreas, 29/30 completed. Denies pain. He denies nausea or abdominal cramping. Denies diarrhea. The patient reports he has whipple procedure scheduled 09/19/16 with Dr. Barry Dienes.  PHYSICAL EXAMINATION: weight is 203 lb (92.1 kg). His oral temperature is 98 F (36.7 C). His blood pressure is 143/81 (abnormal). His respiration is 16.  Alert and in no acute distress.  ASSESSMENT: The patient is doing satisfactorily with treatment.  PLAN: We will continue with the patient's final  radiation treatment as planned. He will follow up with Shona Simpson, PA in one month. I encouraged the patient to reach out to [place] in the interim with any questions or concerns that may arise.   ------------------------------------------------  Jodelle Gross, MD, PhD  This document serves as a record of services personally performed by Kyung Rudd, MD and Shona Simpson, PA. It was created on his behalf by Maryla Morrow, a trained medical scribe. The creation of this record is based on the scribe's personal observations and the provider's statements to them. This document has been checked and approved by the attending provider.

## 2016-08-09 NOTE — Progress Notes (Signed)
Weekly rad tx  Pancreas 29/30 completed, no nausea, no gas, cramping or pain, no diarrhea, surgery for Whipple  With Dr. Barry Dienes on 2/20/218  Follow up 09/13/16 with Shona Simpson, PA  BP (!) 143/81 (BP Location: Right Arm, Patient Position: Sitting, Cuff Size: Normal)   Temp 98 F (36.7 C) (Oral)   Resp 16   Wt 203 lb (92.1 kg)   BMI 29.13 kg/m   Wt Readings from Last 3 Encounters:  08/09/16 203 lb (92.1 kg)  08/04/16 202 lb 6.4 oz (91.8 kg)  08/03/16 204 lb 4.8 oz (92.7 kg)

## 2016-08-09 NOTE — Progress Notes (Signed)
  Radiation Oncology         [phone] ________________________________  Name: Colton Bush MRN: [id number]  Date: 08/03/2016  DOB: 02-24-1956  COMPLEX SIMULATION  NOTE  Diagnosis: pancreatic cancer  Narrative The patient has initially been planned to receive a course of radiation treatment to a dose of 45 gray in 25 fractions at 1.8 gray per fraction. The patient will now receive a boost to the high risk target volume for an additional 9 gray. This will be delivered in 5 fractions at 1.8 gray per fraction and a cone down boost technique will be utilized. To accomplish this, an additional 5 customized blocks have been designed for this purpose, according to the patient's 3D conformal tomotherapy plan. A complex isodose plan is requested to ensure that the high-risk target region receives the appropriate radiation dose and that the nearby normal structures continue to be appropriately spared. The patient's final total dose therefore will be 54 gray.   ________________________________ ------------------------------------------------  Jodelle Gross, MD, PhD

## 2016-08-10 ENCOUNTER — Ambulatory Visit
Admission: RE | Admit: 2016-08-10 | Discharge: 2016-08-10 | Disposition: A | Discharge status: Home / Self Care | Payer: 59 | Source: Ambulatory Visit | Attending: Radiation Oncology | Admitting: Radiation Oncology

## 2016-08-10 DIAGNOSIS — C250 Malignant neoplasm of head of pancreas: Secondary | ICD-10-CM | POA: Diagnosis not present

## 2016-08-21 ENCOUNTER — Encounter: Payer: Self-pay | Admitting: Radiation Oncology

## 2016-08-21 NOTE — Progress Notes (Signed)
°  Radiation Oncology         [phone] ________________________________  Name: Colton Bush MRN: [id number]  Date: 08/21/2016  DOB: 26-Jun-1956  End of Treatment Note  Diagnosis:   Adenocarcinoma of head of pancreas     Indication for treatment:  Curative       Radiation treatment dates:   06/28/16 - 08/10/16  Site/dose:   Pancreas treated to 45 Gy in 25 fractions. The Pancreas was then boosted to 54 Gy in 5 fractions.  Beams/energy:   Pancreas : 3D  //  6X        Boost : Isodose Plan  //  6X  Narrative: The patient tolerated radiation treatment relatively well. The patient denied pain throughout treatment. He had one incidence of an "upset stomach" in the middle of treatment, but it resolved without intervention.  Plan: The patient has completed radiation treatment. The patient will return to radiation oncology clinic for routine followup in one month. I advised them to call or return sooner if they have any questions or concerns related to their recovery or treatment.  ------------------------------------------------  Jodelle Gross, MD, PhD  This document serves as a record of services personally performed by Kyung Rudd, MD. It was created on his behalf by Maryla Morrow, a trained medical scribe. The creation of this record is based on the scribe's personal observations and the provider's statements to them. This document has been checked and approved by the attending provider.

## 2016-08-24 ENCOUNTER — Other Ambulatory Visit: Payer: Self-pay | Admitting: Hematology

## 2016-08-24 DIAGNOSIS — C250 Malignant neoplasm of head of pancreas: Secondary | ICD-10-CM

## 2016-08-27 ENCOUNTER — Other Ambulatory Visit: Payer: Self-pay | Admitting: Family Medicine

## 2016-08-27 NOTE — Progress Notes (Signed)
[place]  [phone] [phone]  Clinic Follow up Note   Patient Care Team: Colton Morale, MD as PCP - General 08/28/2016  CHIEF COMPLAINTS:  Follow up pancreatic cancer  Oncology History   Adenocarcinoma of head of pancreas [facility])   Staging form: Pancreas, AJCC 7th Edition   - Clinical stage from 03/09/2016: Stage IIB (T2, N1, M0) - Signed by Colton Merle, MD on 03/17/2016      Adenocarcinoma of head of pancreas ([facility])   02/27/2016 Imaging    CT chest, abdomen and pelvis with contrast showed ill-defined heterogeneity of pancreatic head, highly suspicious for malignancy, ) quit about and biliary ductal dilatation, mildly prominent lymph nodes in the upper abdomen, largest 2 cm in the portocaval . No other metastasis.       03/09/2016 Initial Diagnosis    Adenocarcinoma of head of pancreas ([place])      03/09/2016 Procedure    Upper EUS showed a 3.5 cm irregular mass in the pancreatic head, causing pancreatic and biliary duct obstruction. The mass involves the portal vein 422 mm, strongly suggesting invasion. There is suspicious nearby adenopathy      03/09/2016 Initial Biopsy    Fine-needle aspiration of the pancreatic mass from EUS showed malignant cells consistent with adenocarcinoma.       03/28/2016 - 05/24/2016 Chemotherapy    neoadjuvant chemo with FOLFIRINOX every 2 weeks      06/28/2016 - 08/10/2016 Radiation Therapy     neoadjuvant radiation      06/28/2016 - 08/10/2016 Chemotherapy    Xeloda 2000 mg in the morning and 1500 mg in the evening, on the day of radiation.       HISTORY OF PRESENTING ILLNESS:  Colton [facility] 61 y.o. male is here because of His newly diagnosed pancreatic cancer. He is accompanied by his wife to our multidisciplinary GI clinic today.  He noticed fatigue, anorexia and weight loss about 50bs in the past 4 months. It started when his endocrinologist changed his diabetic meds. He presented to hospital on  7/30 with jaundice. No pain or nausea. CT scan revealed a heterogeneous pancreatic head mass, highly suspicious for malignancy. Mild biliary and pancreatic duct dilatation, mildly prominent lymph nodes in the upper abdomen. He underwent ERCP and CBD stent placement, and subsequent upper EUS on 03/09/2016, which showed a 3.5 cm irregular mass in the pancreatic head, the mass involves the portal vein, no suspicious nearby adenopathy.  He has been feeling better after stent placement, with improved appetite and energy level. He feels normal again. No symptoms. BM is good, he has gained about 8lbs back.   CURRENT THERAPY: pending surgery   INTERIM HISTORY: MR. Colton [facility] returns for follow up. He is doing very well overall, denies any pain or other discomfort. He has good appetite and energy level, has gained some weight back. He works full time. He is scheduled to have a Whipple surgery on Feb 20th 2018. He has a CT scan scheduled for next week.  MEDICAL HISTORY:  Past Medical History:  Diagnosis Date  . Arthritis    left hand  . Bronchitis 1977  . Cancer ([facility]) 03/09/2016   pancreatic cancer  . Depression    takes Cymbalta daily  . Diabetes mellitus type II    sees Dr. Dwyane [facility]   . GERD (gastroesophageal reflux disease)    takes Omeprazole daily  . H/O hiatal hernia   . Hyperlipidemia    takes Zocor daily  . Hypertension  takes Amlodipine daily  . Neck pain    C4-7 stenosis and herniated disc  . Neuromuscular disorder ([facility])    hiatal hernia  . Scoliosis    slight  . Spinal cord injury, C5-C7 ([facility])    c4-c7  . Stiffness of hand joint    d/t cervical issues    SURGICAL HISTORY: Past Surgical History:  Procedure Laterality Date  . ANTERIOR CERVICAL DECOMP/DISCECTOMY FUSION  08/18/2011   Procedure: ANTERIOR CERVICAL DECOMPRESSION/DISCECTOMY FUSION 3 LEVELS;  Surgeon: Colton Cunas, MD;  Location: [facility];  Service: Neurosurgery;  Laterality: N/A;  Anterior Cervical  Four-Five/Five-Six/Six-Seven Decompression with Fusion, Plating, and Bonegraft  . CARPAL TUNNEL RELEASE  2013   bilateral, per Dr. Christella [facility]   . COLONOSCOPY  10-30-14   per Dr. Olevia [facility], clear, repeat in 10 yrs   . egd with esophageal dilation  9-08   per Dr. Olevia [facility]  . ERCP N/A 03/01/2016   Procedure: ENDOSCOPIC RETROGRADE CHOLANGIOPANCREATOGRAPHY (ERCP) with brushings and stent;  Surgeon: Colton Stabler, MD;  Location: [facility];  Service: [facility];  Laterality: N/A;  . EUS N/A 03/09/2016   Procedure: ESOPHAGEAL ENDOSCOPIC ULTRASOUND (EUS) RADIAL;  Surgeon: Colton Banister, MD;  Location: [facility];  Service: [facility];  Laterality: N/A;  . lymph nodes biopsy    . melanoma rt calf  1999  . PORTACATH PLACEMENT Left 03/22/2016   Procedure: INSERTION PORT-A-CATH;  Surgeon: Colton Klein, MD;  Location: [facility];  Service: General;  Laterality: Left;  . SPINE SURGERY    . TONSILLECTOMY     as a child  . ULNAR TUNNEL RELEASE  2013   right arm, per Dr. Christella [facility]   . UPPER GASTROINTESTINAL [facility]      SOCIAL HISTORY: Social History   Social History  . Marital status: Married    Spouse name: Colton [facility]  . Number of children: N/A  . Years of education: N/A   Occupational History  . Driver    Social History Main Topics  . Smoking status: Never Smoker  . Smokeless tobacco: Never Used     Comment: tried a pipe 35 years ago   . Alcohol use No  . Drug use: No  . Sexual activity: Yes   Other Topics Concern  . 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 Lives with his wife   FAMILY HISTORY: Family History  Problem Relation Age of Onset  . Heart disease Father   . Heart disease Brother 42  . Anesthesia problems Mother   . Heart disease Mother   . Dementia Mother   . Diabetes Sister   . Stroke Sister   . Colon cancer Neg Hx   . Rectal cancer Neg Hx   . Stomach cancer Neg Hx      ALLERGIES:  has No Known Allergies.  MEDICATIONS:  Current Outpatient Prescriptions  Medication Sig Dispense Refill  . amLODipine (NORVASC) 10 MG tablet TAKE 1 TABLET BY MOUTH EVERY DAY 30 tablet 6  . benazepril (LOTENSIN) 10 MG tablet TAKE 1 TABLET (10 MG TOTAL) BY MOUTH DAILY. 30 tablet 3  . capecitabine (XELODA) 500 MG tablet Take 4 tab in the morning and 3 tab in the evening, on the day of radiation only (Monday through Friday) 210 tablet 0  . FREESTYLE LITE test strip USE ONE STRIP TO CHECK GLUCOSE  ONCE DAILY. PLEASE  SCHEDULE FOLLOW UP 50 each 0  . hyaluronate sodium (RADIAPLEXRX) GEL Apply 1 application topically daily.    . insulin aspart (NOVOLOG) 100 UNIT/ML injection Give every morning with breakfast and every evening with supper per the following sliding scale: give 3 units for glucose 150-175, give 6 units for glucose 176-200, give 9 units for glucose 201-250, give 12 units for glucose greater than 250 3 vial 11  . Insulin Detemir (LEVEMIR) 100 UNIT/ML Pen Inject 20 Units into the skin 2 (two) times daily. 90 mL 3  . Insulin Pen Needle 30G X 5 MM MISC Use one daily with insulin 100 each 2  . JARDIANCE 25 MG TABS tablet TAKE 1 TABLET BY MOUTH EVERY DAY 90 tablet 1  . Lancets (FREESTYLE) lancets USE AS INSTRUCTED TO CHECK BLOOD SUGAR ONCE A DAY 100 each 2  . lidocaine-prilocaine (EMLA) cream Apply to affected area once 30 g 3  . Multiple Vitamin (MULTIVITAMIN WITH MINERALS) TABS tablet Take 1 tablet by mouth daily.    Marland Kitchen omega-3 acid ethyl esters (LOVAZA) 1 g capsule Take 2 g by mouth daily.    Marland Kitchen omeprazole (PRILOSEC) 20 MG capsule Take 20 mg by mouth daily.    . ondansetron (ZOFRAN) 8 MG tablet Take 1 tablet (8 mg total) by mouth 2 (two) times daily as needed for refractory nausea / vomiting. Start on day 3 after chemotherapy. 30 tablet 1  . oxyCODONE (OXY IR/ROXICODONE) 5 MG immediate release tablet Take 1-2 tablets (5-10 mg total) by mouth every 4 (four) hours as needed for  moderate pain. 30 tablet 0  . polyethylene glycol (MIRALAX / GLYCOLAX) packet Take 17 g by mouth daily as needed for mild constipation. 14 each 0  . potassium chloride SA (K-DUR,KLOR-CON) 20 MEQ tablet 3 tabs today, 1 tab twice daily x 5 days, then 1 tab daily 40 tablet 1  . prochlorperazine (COMPAZINE) 10 MG tablet Take 1 tablet (10 mg total) by mouth every 6 (six) hours as needed (NAUSEA). 30 tablet 1   No current facility-administered medications for this visit.     REVIEW OF SYSTEMS:   Constitutional: Denies fevers, chills or abnormal night sweats Eyes: Denies blurriness of vision, double vision or watery eyes Ears, nose, mouth, throat, and face: Denies mucositis or sore throat Respiratory: Denies cough, dyspnea or wheezes Cardiovascular: Denies palpitation, chest discomfort or lower extremity swelling Gastrointestinal:  Denies nausea, heartburn or change in bowel habits Skin: Denies abnormal skin rashes Lymphatics: Denies new lymphadenopathy or easy bruising Neurological:Denies numbness, tingling or new weaknesses Behavioral/Psych: Mood is stable, no new changes  All other systems were reviewed with the patient and are negative.  PHYSICAL EXAMINATION: ECOG PERFORMANCE STATUS: 0  Vitals:   08/28/16 0827  BP: 134/70  Pulse: 72  Resp: 18  Temp: 98.5 F (36.9 C)   Filed Weights   08/28/16 0827  Weight: 201 lb (91.2 kg)    GENERAL:alert, no distress and comfortable SKIN: skin color, texture, turgor are normal, no rashes or significant lesions EYES: normal, conjunctiva are pink and non-injected, sclera clear OROPHARYNX:no exudate, no erythema and lips, buccal mucosa, and tongue normal  NECK: supple, thyroid normal size, non-tender, without nodularity LYMPH:  no palpable lymphadenopathy in the cervical, axillary or inguinal LUNGS: clear to auscultation and percussion with normal breathing effort HEART: regular rate & rhythm and no murmurs and no lower extremity  edema ABDOMEN:abdomen soft, non-tender and normal bowel sounds Musculoskeletal:no cyanosis of digits and no clubbing  PSYCH: alert & oriented x 3 with fluent speech NEURO: no focal motor/sensory deficits  LABORATORY DATA:  I have reviewed the data as listed CBC Latest Ref Rng & Units 08/28/2016 08/01/2016 07/25/2016  WBC 4.0 - 10.3 10e3/uL 4.8 5.1 5.2  Hemoglobin 13.0 - 17.1 g/dL 12.7(L) 12.6(L) 12.9(L)  Hematocrit 38.4 - 49.9 % 37.5(L) 38.6 39.6  Platelets 140 - 400 10e3/uL 159 162 153   CMP Latest Ref Rng & Units 08/28/2016 08/01/2016 07/25/2016  Glucose 70 - 140 mg/dl 90 96 132  BUN 7.0 - 26.0 mg/dL 10.6 10.7 13.4  Creatinine 0.7 - 1.3 mg/dL 0.7 0.6(L) 0.7  Sodium 136 - 145 mEq/L 141 143 143  Potassium 3.5 - 5.1 mEq/L 3.8 3.9 5.4(H)  Chloride 101 - 111 mmol/L - - -  CO2 22 - 29 mEq/L 24 26 28   Calcium 8.4 - 10.4 mg/dL 9.4 9.5 10.0  Total Protein 6.4 - 8.3 g/dL 7.3 7.6 7.7  Total Bilirubin 0.20 - 1.20 mg/dL 0.40 0.35 0.40  Alkaline Phos 40 - 150 U/L 222(H) 124 123  AST 5 - 34 U/L 34 24 21  ALT 0 - 55 U/L 41 34 34   CA19.9 (0-35 U/ML) 02/28/2016: 1497 03/24/2016: 648 04/25/2016: 599 05/24/2016: 368 07/18/2016: 51  PATHOLOGY REPORT  Diagnosis 03/09/2016 FINE NEEDLE ASPIRATION, ENDOSCOPIC, PANCREAS (SPECIMEN 1 OF 1 COLLECTED 03/09/16): MALIGNANT CELLS CONSISTENT WITH ADENOCARCINOMA. BACKGROUND NECROTIC DEBRIS AND ACUTE INFLAMMATION.  Diagnosis 03/01/2016 BILE DUCT BRUSHING(SPECIMEN 1 OF 1 COLLECTED 03/01/16): RARE ATYPICAL CELLS, SEE COMMENT.  RADIOGRAPHIC STUDIES: I have personally reviewed the radiological images as listed and agreed with the findings in the report. No results found. EUS 03/09/2016 Dr. Ardis Hughs  - 3.5cm irregularly shaped, poorly defined mass in the pancreatic head. There is suspicious, nearby adenopathy. The mass is causing pancreatic and bililary duct obstruction (previously stented). The mass involves the portal vein for 51mm (abuttment and loss of usual tissue  interface), strongly suggesting invasion. Preliminary cytology results are positive for malignancy (adenocarcinoma), await final report.  ASSESSMENT & PLAN: 61 y.o. Caucasian male, with past medical history of diabetes and hypertension, presented with epigastric pain, weight loss, and jaundice.  1. Primary pancreatic adenocarcinoma, in pancreas head, cT2N1M0, stage IIB, borderline resectable  -I have reviewed his CT abdomen and pelvis with contrast, EUS and biopsy findings with patient and his wife in great details. -His case was reviewed in our GI tumor board a few days ago. CT scan and US showed possible portal vein invasion from the pancreatic tumor, this is borderline resectable disease. No evidence of distant metastasis.  -We reviewed the nature history of pancreatic cancer, and the overall survival rate with chemotherapy, surgery, and radiation. Patient and his wife was discouraged by the overall dismal long term survival rate (~20%)  -He was seen by surgeon Dr. Barry Dienes, Whipple surgery was discussed and offered to patient.  -I reviewed the high risk of cancer recurrence after surgery, and the role of neoadjuvant and adjuvant chemotherapy to reduce the risk of recurrence, and shrink the tumor before surgery. -He received neoadjuvant chemotherapy with FOLFIRINOX, tolerated well  -We reviewed his restaging CT scan from 05/17/2016 in our GI tumor Board this morning, which showed a stable pancreatic mass, the tumor does not involve the SMA, but possible invading the port vein. -He has completed neoadjuvant concurrent chemoradiation  -He is clinically doing very well, has a restaging CT scan scheduled for next week by Dr. Barry Dienes. -He is scheduled to have Whipple surgery on 09/19/2016 -We  discussed the role of adjuvant chemotherapy, especially the combination of Xeloda and gemcitabine, which has shown to better outcome compared to single agent gemcitabine. I plan to start after he recovers well from  the surgery, likely around 6 weeks.  2. Type 2 DM - he will continue medication and follow up with his primary care physician Dr. Sarajane Jews  -We reviewed that his blood glucose will need to be monitored closely during the chemotherapy, and his medication may need to be adjusted  -His blood glucose has been fluctuating lately, he sees a diabetic specialist and now, and checking his finger glucose 3 times a day, better controlled lately   3. HTN -He is on amlodipine and benazepril  -His blood pressure is normal today -The discussed chemotherapy may affect his blood pressure, and he is medication may need to be adjusted  Plan -Restaging CT scan next week -He is scheduled to have Whipple surgery off every 20th -I'll see him back 4 weeks after surgery  All questions were answered. The patient knows to call the clinic with any problems, questions or concerns.  I spent 15 minutes counseling the patient face to face. The total time spent in the appointment was 20 minutes and more than 50% was on counseling.     Colton Merle, MD 08/28/2016

## 2016-08-28 ENCOUNTER — Telehealth: Payer: Self-pay | Admitting: Hematology

## 2016-08-28 ENCOUNTER — Encounter: Payer: Self-pay | Admitting: Hematology

## 2016-08-28 ENCOUNTER — Ambulatory Visit (HOSPITAL_BASED_OUTPATIENT_CLINIC_OR_DEPARTMENT_OTHER): Payer: 59 | Admitting: Hematology

## 2016-08-28 ENCOUNTER — Ambulatory Visit (HOSPITAL_BASED_OUTPATIENT_CLINIC_OR_DEPARTMENT_OTHER): Payer: 59

## 2016-08-28 ENCOUNTER — Other Ambulatory Visit (HOSPITAL_BASED_OUTPATIENT_CLINIC_OR_DEPARTMENT_OTHER): Payer: 59

## 2016-08-28 VITALS — BP 134/70 | HR 72 | Temp 98.5°F | Resp 18 | Ht 70.0 in | Wt 201.0 lb

## 2016-08-28 DIAGNOSIS — E1165 Type 2 diabetes mellitus with hyperglycemia: Secondary | ICD-10-CM

## 2016-08-28 DIAGNOSIS — C250 Malignant neoplasm of head of pancreas: Secondary | ICD-10-CM | POA: Diagnosis not present

## 2016-08-28 DIAGNOSIS — Z794 Long term (current) use of insulin: Secondary | ICD-10-CM | POA: Diagnosis not present

## 2016-08-28 DIAGNOSIS — I10 Essential (primary) hypertension: Secondary | ICD-10-CM | POA: Diagnosis not present

## 2016-08-28 DIAGNOSIS — Z95828 Presence of other vascular implants and grafts: Secondary | ICD-10-CM

## 2016-08-28 DIAGNOSIS — IMO0001 Reserved for inherently not codable concepts without codable children: Secondary | ICD-10-CM

## 2016-08-28 LAB — COMPREHENSIVE METABOLIC PANEL
ALT: 41 U/L (ref 0–55)
AST: 34 U/L (ref 5–34)
Albumin: 3.8 g/dL (ref 3.5–5.0)
Alkaline Phosphatase: 222 U/L — ABNORMAL HIGH (ref 40–150)
Anion Gap: 9 mEq/L (ref 3–11)
BUN: 10.6 mg/dL (ref 7.0–26.0)
CO2: 24 mEq/L (ref 22–29)
Calcium: 9.4 mg/dL (ref 8.4–10.4)
Chloride: 108 mEq/L (ref 98–109)
Creatinine: 0.7 mg/dL (ref 0.7–1.3)
EGFR: >90 mL/min/{1.73_m2} (ref >90)
Glucose: 90 mg/dl (ref 70–140)
Potassium: 3.8 mEq/L (ref 3.5–5.1)
Sodium: 141 mEq/L (ref 136–145)
Total Bilirubin: 0.4 mg/dL (ref 0.20–1.20)
Total Protein: 7.3 g/dL (ref 6.4–8.3)

## 2016-08-28 LAB — CBC WITH DIFFERENTIAL/PLATELET
BASO%: 1.8 % (ref 0.0–2.0)
Basophils Absolute: 0.1 10*3/uL (ref 0.0–0.1)
EOS%: 11.4 % — ABNORMAL HIGH (ref 0.0–7.0)
Eosinophils Absolute: 0.5 10*3/uL (ref 0.0–0.5)
HCT: 37.5 % — ABNORMAL LOW (ref 38.4–49.9)
HGB: 12.7 g/dL — ABNORMAL LOW (ref 13.0–17.1)
LYMPH%: 7.2 % — ABNORMAL LOW (ref 14.0–49.0)
MCH: 30.6 pg (ref 27.2–33.4)
MCHC: 33.9 g/dL (ref 32.0–36.0)
MCV: 90.2 fL (ref 79.3–98.0)
MONO#: 0.5 10*3/uL (ref 0.1–0.9)
MONO%: 10.9 % (ref 0.0–14.0)
NEUT#: 3.3 10*3/uL (ref 1.5–6.5)
NEUT%: 68.7 % (ref 39.0–75.0)
Platelets: 159 10*3/uL (ref 140–400)
RBC: 4.15 10*6/uL — ABNORMAL LOW (ref 4.20–5.82)
RDW: 18.2 % — ABNORMAL HIGH (ref 11.0–14.6)
WBC: 4.8 10*3/uL (ref 4.0–10.3)
lymph#: 0.3 10*3/uL — ABNORMAL LOW (ref 0.9–3.3)

## 2016-08-28 MED ORDER — HEPARIN SOD (PORK) LOCK FLUSH 100 UNIT/ML IV SOLN
500.0000 [IU] | Freq: Once | INTRAVENOUS | Status: AC | PRN
  Administered 2016-08-28: 500 [IU] via INTRAVENOUS
  Filled 2016-08-28: qty 5

## 2016-08-28 MED ORDER — SODIUM CHLORIDE 0.9 % IJ SOLN
10.0000 mL | INTRAMUSCULAR | Status: DC | PRN
  Administered 2016-08-28: 10 mL via INTRAVENOUS
  Filled 2016-08-28: qty 10

## 2016-08-28 NOTE — Telephone Encounter (Signed)
Appointments scheduled per 1/29 LOS. Patient given AVS report and calendars with future scheduled appointments. During scheduling, desk nurse suggested / requested that the patient have his port flush.

## 2016-08-28 NOTE — Patient Instructions (Signed)

## 2016-08-28 NOTE — Telephone Encounter (Signed)
Can we refill this? 

## 2016-08-29 ENCOUNTER — Other Ambulatory Visit: Payer: Self-pay

## 2016-08-29 DIAGNOSIS — C250 Malignant neoplasm of head of pancreas: Secondary | ICD-10-CM

## 2016-08-29 LAB — CANCER ANTIGEN 19-9: CA 19-9: 55 U/mL — ABNORMAL HIGH (ref 0–35)

## 2016-08-29 MED ORDER — POTASSIUM CHLORIDE CRYS ER 20 MEQ PO TBCR: 20.0000 meq | EXTENDED_RELEASE_TABLET | Freq: Every day | ORAL | 1 refills | Status: DC

## 2016-09-04 ENCOUNTER — Ambulatory Visit
Admission: RE | Admit: 2016-09-04 | Discharge: 2016-09-04 | Disposition: A | Discharge status: Home / Self Care | Payer: 59 | Source: Ambulatory Visit | Attending: General Surgery | Admitting: General Surgery

## 2016-09-04 DIAGNOSIS — C250 Malignant neoplasm of head of pancreas: Secondary | ICD-10-CM

## 2016-09-04 MED ORDER — IOPAMIDOL (ISOVUE-300) INJECTION 61%
125.0000 mL | Freq: Once | INTRAVENOUS | Status: AC | PRN
Start: 2016-09-04 — End: 2016-09-04
  Administered 2016-09-04: 125 mL via INTRAVENOUS

## 2016-09-04 NOTE — Progress Notes (Signed)
Please let mr Leven know that imaging looks stable.  Will plan to proceed with surgery.

## 2016-09-06 NOTE — Progress Notes (Addendum)
Mr. Conley Rolls. Mangieri 61 y.o man with Adenocarcinoma of head of pancreas radiation completed 08-10-16 one month FU    Pain:None Nausea:None Bowel pattern:None having normal bowel movements daily. Appetite:Good eating three meals a day with a few snacks. Weight: Wt Readings from Last 3 Encounters:  09/13/16 201 lb 12.8 oz (91.5 kg)  08/28/16 201 lb (91.2 kg)  08/09/16 203 lb (92.1 kg)  BP (!) 154/81 (BP Location: Right Arm, Patient Position: Sitting, Cuff Size: Normal)   Pulse 74   Temp 98.6 F (37 C) (Oral)   Resp 20   Ht 5\' 10"  (1.778 m)   Wt 201 lb 12.8 oz (91.5 kg)   BMI 28.96 kg/m

## 2016-09-13 ENCOUNTER — Encounter (HOSPITAL_COMMUNITY): Payer: Self-pay

## 2016-09-13 ENCOUNTER — Encounter (HOSPITAL_COMMUNITY)
Admission: RE | Admit: 2016-09-13 | Discharge: 2016-09-13 | Disposition: A | Discharge status: Home / Self Care | Payer: 59 | Source: Ambulatory Visit | Attending: General Surgery | Admitting: General Surgery

## 2016-09-13 ENCOUNTER — Encounter: Payer: Self-pay | Admitting: Radiation Oncology

## 2016-09-13 ENCOUNTER — Ambulatory Visit (HOSPITAL_COMMUNITY)
Admission: RE | Admit: 2016-09-13 | Discharge: 2016-09-13 | Disposition: A | Discharge status: Home / Self Care | Payer: 59 | Source: Ambulatory Visit | Attending: Radiation Oncology | Admitting: Radiation Oncology

## 2016-09-13 ENCOUNTER — Other Ambulatory Visit: Payer: Self-pay | Admitting: General Surgery

## 2016-09-13 VITALS — BP 154/81 | HR 74 | Temp 98.6°F | Resp 20 | Ht 70.0 in | Wt 201.8 lb

## 2016-09-13 DIAGNOSIS — R160 Hepatomegaly, not elsewhere classified: Secondary | ICD-10-CM | POA: Diagnosis not present

## 2016-09-13 DIAGNOSIS — I2510 Atherosclerotic heart disease of native coronary artery without angina pectoris: Secondary | ICD-10-CM | POA: Insufficient documentation

## 2016-09-13 DIAGNOSIS — I700 Atherosclerosis of aorta: Secondary | ICD-10-CM | POA: Insufficient documentation

## 2016-09-13 DIAGNOSIS — C250 Malignant neoplasm of head of pancreas: Secondary | ICD-10-CM

## 2016-09-13 DIAGNOSIS — Z794 Long term (current) use of insulin: Secondary | ICD-10-CM | POA: Diagnosis not present

## 2016-09-13 DIAGNOSIS — R911 Solitary pulmonary nodule: Secondary | ICD-10-CM | POA: Diagnosis not present

## 2016-09-13 DIAGNOSIS — Z01812 Encounter for preprocedural laboratory examination: Secondary | ICD-10-CM | POA: Insufficient documentation

## 2016-09-13 LAB — COMPREHENSIVE METABOLIC PANEL
ALT: 69 U/L — ABNORMAL HIGH (ref 17–63)
AST: 53 U/L — ABNORMAL HIGH (ref 15–41)
Albumin: 3.9 g/dL (ref 3.5–5.0)
Alkaline Phosphatase: 361 U/L — ABNORMAL HIGH (ref 38–126)
Anion gap: 6 (ref 5–15)
BUN: 9 mg/dL (ref 6–20)
CO2: 30 mmol/L (ref 22–32)
Calcium: 9.8 mg/dL (ref 8.9–10.3)
Chloride: 104 mmol/L (ref 101–111)
Creatinine, Ser: 0.58 mg/dL — ABNORMAL LOW (ref 0.61–1.24)
GFR calc Af Amer: >60 mL/min (ref >60)
GFR calc non Af Amer: >60 mL/min (ref >60)
Glucose, Bld: 143 mg/dL — ABNORMAL HIGH (ref 65–99)
Potassium: 4.7 mmol/L (ref 3.5–5.1)
Sodium: 140 mmol/L (ref 135–145)
Total Bilirubin: 0.6 mg/dL (ref 0.3–1.2)
Total Protein: 7.8 g/dL (ref 6.5–8.1)

## 2016-09-13 LAB — CBC WITH DIFFERENTIAL/PLATELET
Basophils Absolute: 0.1 10*3/uL (ref 0.0–0.1)
Basophils Relative: 1 %
Eosinophils Absolute: 0.9 10*3/uL — ABNORMAL HIGH (ref 0.0–0.7)
Eosinophils Relative: 13 %
HCT: 39 % (ref 39.0–52.0)
Hemoglobin: 12.7 g/dL — ABNORMAL LOW (ref 13.0–17.0)
Lymphocytes Relative: 10 %
Lymphs Abs: 0.7 10*3/uL (ref 0.7–4.0)
MCH: 29.4 pg (ref 26.0–34.0)
MCHC: 32.6 g/dL (ref 30.0–36.0)
MCV: 90.3 fL (ref 78.0–100.0)
Monocytes Absolute: 0.6 10*3/uL (ref 0.1–1.0)
Monocytes Relative: 8 %
Neutro Abs: 4.8 10*3/uL (ref 1.7–7.7)
Neutrophils Relative %: 68 %
Platelets: 243 10*3/uL (ref 150–400)
RBC: 4.32 MIL/uL (ref 4.22–5.81)
RDW: 17.2 % — ABNORMAL HIGH (ref 11.5–15.5)
WBC: 7 10*3/uL (ref 4.0–10.5)

## 2016-09-13 LAB — URINALYSIS, ROUTINE W REFLEX MICROSCOPIC
Bilirubin Urine: NEGATIVE
Glucose, UA: 150 mg/dL — AB
Hgb urine dipstick: NEGATIVE
Ketones, ur: NEGATIVE mg/dL
Leukocytes, UA: NEGATIVE
Nitrite: NEGATIVE
Protein, ur: NEGATIVE mg/dL
Specific Gravity, Urine: 1.011 (ref 1.005–1.030)
pH: 6 (ref 5.0–8.0)

## 2016-09-13 LAB — PROTIME-INR
INR: 1.02
Prothrombin Time: 13.4 seconds (ref 11.4–15.2)

## 2016-09-13 LAB — PREPARE RBC (CROSSMATCH)

## 2016-09-13 LAB — GLUCOSE, CAPILLARY: Glucose-Capillary: 161 mg/dL — ABNORMAL HIGH (ref 65–99)

## 2016-09-13 LAB — ABO/RH: ABO/RH(D): A NEG

## 2016-09-13 NOTE — Progress Notes (Signed)
Radiation Oncology         [phone] ________________________________  Name: Colton Bush MRN: [id number]  Date: 09/13/2016  DOB: [birth year]  Post Treatment Note  CC: Alysia Penna, MD  Truitt Merle, MD  Diagnosis: Stage IIB, T2, N1, M0 adenocarcioma of the pancreatic head  Interval Since Last Radiation:  4 weeks   06/28/16 - 08/10/16: Pancreas treated to 45 Gy in 25 fractions. The Pancreas was then boosted to 54 Gy in 5 fractions.  Narrative:  The patient returns today for routine follow-up. The patient tolerated radiotherapy well with the exception of one episode of an "upset stomach" which was self limiting. He is planning to go to the OR next Tuesday for diagnostic laparoscopy with possible whipple procedure.                            On review of systems, the patient states he's doing well overall. He denies any abdominal pain, nausea, vomiting, chest pain, or shortness of breath. He's been able to work full time during treatment. He denies any other complaints or verbalized.  ALLERGIES:  has No Known Allergies.  Meds: Current Outpatient Prescriptions  Medication Sig Dispense Refill  . amLODipine (NORVASC) 10 MG tablet TAKE 1 TABLET BY MOUTH EVERY DAY 30 tablet 6  . benazepril (LOTENSIN) 10 MG tablet TAKE 1 TABLET (10 MG TOTAL) BY MOUTH DAILY. 30 tablet 3  . FREESTYLE LITE test strip USE ONE STRIP TO CHECK GLUCOSE ONCE DAILY. PLEASE  SCHEDULE FOLLOW UP 50 each 0  . insulin aspart (NOVOLOG) 100 UNIT/ML injection Give every morning with breakfast and every evening with supper per the following sliding scale: give 3 units for glucose 150-175, give 6 units for glucose 176-200, give 9 units for glucose 201-250, give 12 units for glucose greater than 250 3 vial 11  . Insulin Detemir (LEVEMIR) 100 UNIT/ML Pen Inject 20 Units into the skin 2 (two) times daily. 90 mL 3  . Insulin Pen Needle 30G X 5 MM MISC Use one daily with insulin 100 each 2  . JARDIANCE 25 MG TABS tablet TAKE 1 TABLET  BY MOUTH EVERY DAY 90 tablet 1  . Lancets (FREESTYLE) lancets USE AS INSTRUCTED TO CHECK BLOOD SUGAR ONCE A DAY 100 each 2  . lidocaine-prilocaine (EMLA) cream Apply to affected area once (Patient taking differently: Apply 1 application topically daily as needed (for port flushing.). ) 30 g 3  . loratadine (CLARITIN) 10 MG tablet Take 10 mg by mouth daily.    . Multiple Vitamin (MULTIVITAMIN WITH MINERALS) TABS tablet Take 1 tablet by mouth daily.    . Omega-3 Fatty Acids (FISH OIL) 1200 MG CAPS Take 2,400 mg by mouth daily.    Marland Kitchen omeprazole (PRILOSEC) 20 MG capsule TAKE ONE CAPSULE BY MOUTH ONCE DAILY 90 capsule 3  . potassium chloride SA (KLOR-CON M20) 20 MEQ tablet Take 1 tablet (20 mEq total) by mouth daily. 30 tablet 1  . capecitabine (XELODA) 500 MG tablet Take 4 tab in the morning and 3 tab in the evening, on the day of radiation only (Monday through Friday) (Patient not taking: Reported on 09/13/2016) 210 tablet 0  . naproxen sodium (ANAPROX) 220 MG tablet Take 440 mg by mouth 2 (two) times daily as needed (for pain.).    Marland Kitchen omega-3 acid ethyl esters (LOVAZA) 1 g capsule Take 2 g by mouth daily.     No current facility-administered medications for this  encounter.     Physical Findings:  height is 5\' 10"  (1.778 m) and weight is 201 lb 12.8 oz (91.5 kg). His oral temperature is 98.6 F (37 C). His blood pressure is 154/81 (abnormal) and his pulse is 74. His respiration is 20.  Pain Assessment Pain Score: 0-No pain/10 In general this is a well appearing Caucasian male in no acute distress. He's alert and oriented x4 and appropriate throughout the examination. Cardiopulmonary assessment is negative for acute distress and he exhibits normal effort.   Lab Findings: Lab Results  Component Value Date   WBC 7.0 09/13/2016   HGB 12.7 (L) 09/13/2016   HCT 39.0 09/13/2016   MCV 90.3 09/13/2016   PLT 243 09/13/2016     Radiographic Findings: Ct Abdomen Pelvis W Wo Contrast  Result Date:  09/04/2016 CLINICAL DATA:  Adenocarcinoma head of pancreas with chemotherapy and radiation therapy. EXAM: CT CHEST WITH CONTRAST CT ABDOMEN AND PELVIS WITH AND WITHOUT CONTRAST TECHNIQUE: Multidetector CT imaging of the chest was performed during intravenous contrast administration. Multidetector CT imaging of the abdomen and pelvis was performed following the standard protocol before and during bolus administration of intravenous contrast. CONTRAST:  164mL ISOVUE-300 IOPAMIDOL (ISOVUE-300) INJECTION 61% COMPARISON:  05/16/2016. FINDINGS: CT CHEST FINDINGS Cardiovascular: Left-sided Port-A-Cath terminates in the SVC. Coronary artery calcification. Heart size normal. No pericardial effusion. Mediastinum/Nodes: No pathologically enlarged mediastinal, hilar or axillary lymph nodes. There may be distal esophageal wall thickening which can be seen with gastroesophageal reflux. Lungs/Pleura: Smudgy 4 mm subpleural anterior segment right upper lobe nodule is unchanged and likely a subpleural lymph node. Mild scarring in the right middle lobe and lingula. No pleural fluid. Airway is unremarkable. Musculoskeletal: No worrisome lytic or sclerotic lesions. Degenerative changes are seen in the spine. CT ABDOMEN AND PELVIS FINDINGS Hepatobiliary: Liver measures 21.7 cm. Stable intrahepatic and extrahepatic biliary ductal dilatation with a common bile duct stent in place. Left hepatic lobe atrophy. Mild gallbladder wall edema, similar. Pancreas: There is fullness in the pancreatic head and uncinate process without a discrete measurable mass. Overall, the appearance is similar, including marked pancreatic ductal dilatation and slight atrophy of the body and tail. Spleen: Upper limits of normal in size.  Otherwise negative. Adrenals/Urinary Tract: Adrenal glands are unremarkable. Subcentimeter low-attenuation lesions in the kidneys are too small to characterize but cysts are most likely. Ureters are decompressed. Bladder is grossly  unremarkable. Stomach/Bowel: Stomach, small bowel, appendix and colon are unremarkable. Vascular/Lymphatic: Atherosclerotic calcification of the arterial vasculature without abdominal aortic aneurysm. No vascular encasement is associated with the pancreatic head mass. All Portacaval lymph node measures 1.8 cm (series 9, image 144), stable. Reproductive: Prostate may be minimally prominent. Other: No free fluid.  Tiny periumbilical hernia contains fat. Musculoskeletal: A densely sclerotic lesion in the left iliac wing is likely a bone island. Degenerative changes are seen in the spine. IMPRESSION: 1. Poorly defined pancreatic head mass is grossly stable, with associated pancreatic ductal dilatation and portacaval adenopathy. No associated vascular encasement. 2. Common bile duct stent in place with stable biliary ductal dilatation. Left hepatic lobe atrophy. 3. Hepatomegaly.  Spleen is at the upper limits normal in size. 4. Aortic atherosclerosis (ICD10-170.0). Coronary artery calcification. Electronically Signed   By: Lorin Picket M.D.   On: 09/04/2016 14:03   Ct Chest W Contrast  Result Date: 09/04/2016 CLINICAL DATA:  Adenocarcinoma head of pancreas with chemotherapy and radiation therapy. EXAM: CT CHEST WITH CONTRAST CT ABDOMEN AND PELVIS WITH AND WITHOUT CONTRAST TECHNIQUE: Multidetector  CT imaging of the chest was performed during intravenous contrast administration. Multidetector CT imaging of the abdomen and pelvis was performed following the standard protocol before and during bolus administration of intravenous contrast. CONTRAST:  165mL ISOVUE-300 IOPAMIDOL (ISOVUE-300) INJECTION 61% COMPARISON:  05/16/2016. FINDINGS: CT CHEST FINDINGS Cardiovascular: Left-sided Port-A-Cath terminates in the SVC. Coronary artery calcification. Heart size normal. No pericardial effusion. Mediastinum/Nodes: No pathologically enlarged mediastinal, hilar or axillary lymph nodes. There may be distal esophageal wall  thickening which can be seen with gastroesophageal reflux. Lungs/Pleura: Smudgy 4 mm subpleural anterior segment right upper lobe nodule is unchanged and likely a subpleural lymph node. Mild scarring in the right middle lobe and lingula. No pleural fluid. Airway is unremarkable. Musculoskeletal: No worrisome lytic or sclerotic lesions. Degenerative changes are seen in the spine. CT ABDOMEN AND PELVIS FINDINGS Hepatobiliary: Liver measures 21.7 cm. Stable intrahepatic and extrahepatic biliary ductal dilatation with a common bile duct stent in place. Left hepatic lobe atrophy. Mild gallbladder wall edema, similar. Pancreas: There is fullness in the pancreatic head and uncinate process without a discrete measurable mass. Overall, the appearance is similar, including marked pancreatic ductal dilatation and slight atrophy of the body and tail. Spleen: Upper limits of normal in size.  Otherwise negative. Adrenals/Urinary Tract: Adrenal glands are unremarkable. Subcentimeter low-attenuation lesions in the kidneys are too small to characterize but cysts are most likely. Ureters are decompressed. Bladder is grossly unremarkable. Stomach/Bowel: Stomach, small bowel, appendix and colon are unremarkable. Vascular/Lymphatic: Atherosclerotic calcification of the arterial vasculature without abdominal aortic aneurysm. No vascular encasement is associated with the pancreatic head mass. All Portacaval lymph node measures 1.8 cm (series 9, image 144), stable. Reproductive: Prostate may be minimally prominent. Other: No free fluid.  Tiny periumbilical hernia contains fat. Musculoskeletal: A densely sclerotic lesion in the left iliac wing is likely a bone island. Degenerative changes are seen in the spine. IMPRESSION: 1. Poorly defined pancreatic head mass is grossly stable, with associated pancreatic ductal dilatation and portacaval adenopathy. No associated vascular encasement. 2. Common bile duct stent in place with stable biliary  ductal dilatation. Left hepatic lobe atrophy. 3. Hepatomegaly.  Spleen is at the upper limits normal in size. 4. Aortic atherosclerosis (ICD10-170.0). Coronary artery calcification. Electronically Signed   By: Lorin Picket M.D.   On: 09/04/2016 14:03    Impression/Plan: 1. Stage IIB, T2, N1, M0 adenocarcioma of the pancreatic head. The patient appears to have recovered from the effects of radiotherapy. He is scheduled to undergo diagnostic laparoscopy with possible whipple procedure on 09/19/16 with Dr. Barry Dienes. We will plan to see him back as needed moving forward. He states agreement and understanding, and will contact us with questions or concerns regarding his care.     Carola Rhine, PAC

## 2016-09-13 NOTE — Pre-Procedure Instructions (Addendum)
Colton Bush  09/13/2016      [facility] [place] [facility] [address] [place] [address] Phone: [phone] Fax: [phone]  [id number] [place], [place] [facility] [address] [place]. [place] [address] Phone: [phone] Fax: [facility] [address] [place] - [id number] [place] [place] [address] Phone: [phone] Fax: [phone]  [facility] [place] [address] Phone: [phone] Fax: [phone]    Your procedure is scheduled on 09/19/16  Report to [facility] at 530 A.M.  Call this number if you have problems the morning of surgery:  [phone]   Remember:  Do not eat food or drink liquids after midnight.  Take these medicines the morning of surgery with A SIP OF WATER      Amlodipine(norvasc),omeprazole(prilosec)  STOP all herbel meds, nsaids (aleve,naproxen,advil,ibuprofen) 7 days prior to surgery starting now including all vitamins,fish oil,lovaza,aspirin   See insulin inst below     How to Manage Your Diabetes Before and After Surgery  Why is it important to control my blood sugar before and after surgery? . Improving blood sugar levels before and after surgery helps healing and can limit problems. . A way of improving blood sugar control is eating a healthy diet by: o  Eating less sugar and carbohydrates o  Increasing activity/exercise o  Talking with your doctor about reaching your blood sugar goals . High blood sugars (greater than 180 mg/dL) can raise your risk of infections and slow your recovery, so you will need to focus on controlling your diabetes during the weeks before surgery. . Make sure that the doctor who takes care of your  diabetes knows about your planned surgery including the date and location.  How do I manage my blood sugar before surgery? . Check your blood sugar at least 4 times a day, starting 2 days before surgery, to make sure that the level is not too high or low. o Check your blood sugar the morning of your surgery when you wake up and every 2 hours until you get to the Short Stay unit. . If your blood sugar is less than 70 mg/dL, you will need to treat for low blood sugar: o Do not take insulin. o Treat a low blood sugar (less than 70 mg/dL) with  cup of clear juice (cranberry or apple), 4 glucose tablets, OR glucose gel. o Recheck blood sugar in 15 minutes after treatment (to make sure it is greater than 70 mg/dL). If your blood sugar is not greater than 70 mg/dL on recheck, call [phone] for further instructions. . Report your blood sugar to the short stay nurse when you get to Short Stay.  . If you are admitted to the hospital after surgery: o Your blood sugar will be checked by the staff and you will probably be given insulin after surgery (instead of oral diabetes medicines) to make sure you have good blood sugar levels. o The goal for blood sugar control after surgery is 80-180 mg/dL.   WHAT DO I DO ABOUT MY DIABETES MEDICATION?     THE NIGHT BEFORE SURGERY, take 10 units of levemir insulin.         THE MORNING OF SURGERY, take10  units of levemir insulin.  Do not wear jewelry, make-up or nail polish.  Do not wear lotions, powders, or perfumes, or deoderant.  Do not shave 48 hours prior to surgery.  Men may shave face and neck.  Do not bring valuables to the hospital.  [facility] is not responsible for any belongings or valuables.  Contacts, dentures or bridgework may not be worn into surgery.  Leave your suitcase in the car.  After surgery it may be brought to your room.  For patients admitted to the hospital, discharge time will be determined by your treatment  team.  Patients discharged the day of surgery will not be allowed to drive home.   Special instructions  Special Instructions: [facility] - Preparing for Surgery  Before surgery, you can play an important role.  Because skin is not sterile, your skin needs to be as free of germs as possible.  You can reduce the number of germs on you skin by washing with CHG (chlorahexidine gluconate) soap before surgery.  CHG is an antiseptic cleaner which kills germs and bonds with the skin to continue killing germs even after washing.  Please DO NOT use if you have an allergy to CHG or antibacterial soaps.  If your skin becomes reddened/irritated stop using the CHG and inform your nurse when you arrive at Short Stay.  Do not shave (including legs and underarms) for at least 48 hours prior to the first CHG shower.  You may shave your face.  Please follow these instructions carefully:   1.  Shower with CHG Soap the night before surgery and the morning of Surgery.  2.  If you choose to wash your hair, wash your hair first as usual with your normal shampoo.  3.  After you shampoo, rinse your hair and body thoroughly to remove the Shampoo.  4.  Use CHG as you would any other liquid soap.  You can apply chg directly  to the skin and wash gently with scrungie or a clean washcloth.  5.  Apply the CHG Soap to your body ONLY FROM THE NECK DOWN.  Do not use on open wounds or open sores.  Avoid contact with your eyes ears, mouth and genitals (private parts).  Wash genitals (private parts)       with your normal soap.  6.  Wash thoroughly, paying special attention to the area where your surgery will be performed.  7.  Thoroughly rinse your body with warm water from the neck down.  8.  DO NOT shower/wash with your normal soap after using and rinsing off the CHG Soap.  9.  Pat yourself dry with a clean towel.            10.  Wear clean pajamas.            11.  Place clean sheets on your bed the night of your first  shower and do not sleep with pets.  Day of Surgery  Do not apply any lotions/deodorants the morning of surgery.  Please wear clean clothes to the hospital/surgery center.  Please read over the fact sheets that you were given.

## 2016-09-13 NOTE — Progress Notes (Signed)
Colton Bush  Location: [facility] Surgery Patient #: [id number] DOB: 02-02-1956 Married / Language: English / Race: White Male   History of Present Illness  The patient is a 61 year old male who presents for a follow-up for Pancreatic cancer. Patient was referred by Dr. Loletha Carrow for diagnosis of adenocarcinoma of the pancreatic head in August 2017. He presented with jaundice, anorexia, fatigue, and weight loss. He was so tired, he was unable to go to work. He had lost around 50 pounds in 4 months, but regained back to baseline. He underwent ERCP and EUS. Plastic stent was placed. He had a 3.5 cm mass that appeared to invade the portal vein. He has some suspicious nodes as well. He had no evidence of metastatic disease on staging scans. He has no family history of cancer.   He had FOLFIRINOX chemotherapy and restaging scan in October showed continued involvement of portal vein and possible involvement of proper hepatic vein. He has subsequently had chemoradiation concurrent with xeloda. He is doing very well. His weight is stable and his blood sugars have also been stable. He denies pain. He works with [employer] and has been working full time.   CA 19-9 down to 51. At diagnosis, it was 1497.   CT 05/17/2016 IMPRESSION: 1. Highly indistinct pancreatic head mass, similar enhancement to the surrounding pancreas, but with abrupt truncation of the dorsal pancreatic duct. Biliary stent in place with mild intrahepatic biliary dilatation. Borderline enlarged peripancreatic lymph node an abnormally enlarged portacaval lymph node concerning for local metastatic disease. No liver metastatic lesion identified. There is peripancreatic stranding favoring pancreatitis, but a component of this stranding especially in the vicinity of the pancreatic head could represent local tumor extension adjacent to the duodenum, superior mesenteric vein, and proper hepatic artery, and  involvement of the structures is not excluded. 2. 10 mm hypodense lesion along the inferior splenic hilum, stable, nonspecific. 3. Diffuse gallbladder wall thickening is present and cholecystitis is not excluded. There is also sessile mural thickening in the gallbladder along the fundus up to 1 cm, and gallbladder mass is not readily excluded. The 4. Stable 4 mm anterior right upper lobe pulmonary nodule, no change from July. This may warrant observation. 5. Other imaging findings of potential clinical significance: Coronary atherosclerosis. Thoracolumbar scoliosis. Lower lumbar spondylosis and degenerative disc disease with foraminal impingement at all levels between L2 and S1. Aortoiliac atherosclerotic vascular disease.  Initial EUS August 2017 1. An irregular mass was identified in the pancreatic head. The mass was hypoechoic, heterogenous and mixed solid and cystic. The mass measured 35 mm in maximal cross-sectional diameter. Theendosonographic borders were poorly-defined. There was sonographic evidence suggesting invasion into the portal vein (manifested by interface loss of 35mm). The celiac trunk, SMA, SMV were not involved with the mass. The remainder of the pancreas was examined. The endosonographic appearance of parenchyma and the upstream pancreatic duct indicated duct dilation and a maximum duct diameter of 7 mm. Fine needle aspiration for cytology was performed. Color Doppler imaging was utilized prior to needle puncture to confirm a lack of significant vascular structures within the needle path. Three passes were made with the 25 gauge needle using a transduodenal approach. Some passes were made with a stylet. A cytotechnologist was present to evaluate the adequacy of the specimen. Final cytology results are pending. 2. One peripancreatic lymphnode, measuring 1.2cm across, directly abuting the above mass. 3. Previously placed plastic CBD stent in good position, CBD diameter  1.1cm currently.  4. Limited views of liver, spleen, splenic vessels were all normal.  Past Medical History: Diagnosis Date . Arthritis left hand . Bronchitis 1977 . Depression takes Cymbalta daily . Diabetes mellitus type II sees [name] . GERD (gastroesophageal reflux disease) takes Omeprazole daily . H/O hiatal hernia . Hyperlipidemia takes Zocor daily . Hypertension takes Amlodipine daily . Neck pain C4-7 stenosis and herniated disc . Neuromuscular disorder ([place]) hiatal hernia . Scoliosis slight . Spinal cord injury, C5-C7 ([place]) c4-c7 . Stiffness of hand joint d/t cervical issues    Past Surgical History: Procedure Laterality Date . ANTERIOR CERVICAL DECOMP/DISCECTOMY FUSION 08/18/2011 Procedure: ANTERIOR CERVICAL DECOMPRESSION/DISCECTOMY FUSION 3 LEVELS; Surgeon: Winfield Cunas, MD; Location: [place] [facility]; Service: Neurosurgery; Laterality: N/A; Anterior Cervical Four-Five/Five-Six/Six-Seven Decompression with Fusion, Plating, and Bonegraft . CARPAL TUNNEL RELEASE 2013 bilateral, per Dr. Christella Noa . COLONOSCOPY 10-30-14 per Dr. Olevia Perches, clear, repeat in 10 yrs . egd with esophageal dilation 9-08 per Dr. Olevia Perches . lymph nodes biopsy . melanoma rt calf 1999 . SPINE SURGERY . TONSILLECTOMY as a child . ULNAR TUNNEL RELEASE 2013 right arm, per Dr. Christella Noa . UPPER GASTROINTESTINAL ENDOSCOPY  SH never smoker. no alcohol or drug use, Married.     Allergies No Known Drug Allergies  Medication History AmLODIPine Besylate (10MG  Tablet, Oral) Active. Benazepril HCl (10MG  Tablet, Oral) Active. Docusate Sodium (100MG  Capsule, Oral) Active. GlipiZIDE ER (10MG  Tablet ER 24HR, Oral) Active. Omeprazole (20MG  Capsule DR, Oral) Active. Fish Oil (Oral) Specific strength unknown - Active. Levemir (100UNIT/ML Solution, Subcutaneous) Active. NovoLOG FlexPen (100UNIT/ML Soln Pen-inj, Subcutaneous) Active. Multivitamin (Oral) Active. Potassium Chloride  (Oral) Specific strength unknown - Active. Xeloda (Oral) Specific strength unknown - Active. Medications Reconciled    Review of Systems All other systems negative  Vitals Weight: 200 lb Height: 70in Body Surface Area: 2.09 m Body Mass Index: 28.7 kg/m  Temp.: 96.16F  Pulse: 75 (Regular)  BP: 134/78 (Sitting, Left Arm, Standard)       Physical Exam General Mental Status-Alert. General Appearance-Consistent with stated age. Hydration-Well hydrated. Voice-Normal.  Head and Neck Head-normocephalic, atraumatic with no lesions or palpable masses.  Eye Sclera/Conjunctiva - Bilateral-No scleral icterus.  Chest and Lung Exam Chest and lung exam reveals -quiet, even and easy respiratory effort with no use of accessory muscles. Inspection Chest Wall - Normal. Back - normal.  Breast - Did not examine.  Cardiovascular Cardiovascular examination reveals -normal pedal pulses bilaterally. Note: regular rate and rhythm  Abdomen Inspection-Inspection Normal. Palpation/Percussion Palpation and Percussion of the abdomen reveal - Soft, Non Tender, No Rebound tenderness, No Rigidity (guarding) and No hepatosplenomegaly.  Peripheral Vascular Upper Extremity Inspection - Bilateral - Normal - No Clubbing, No Cyanosis, No Edema, Pulses Intact. Lower Extremity Palpation - Edema - Bilateral - No edema.  Neurologic Neurologic evaluation reveals -alert and oriented x 3 with no impairment of recent or remote memory. Mental Status-Normal.  Musculoskeletal Global Assessment -Note: no gross deformities.  Normal Exam - Left-Upper Extremity Strength Normal and Lower Extremity Strength Normal. Normal Exam - Right-Upper Extremity Strength Normal and Lower Extremity Strength Normal.  Lymphatic Head & Neck  General Head & Neck Lymphatics: Bilateral - Description - Normal. Axillary  General Axillary Region: Bilateral - Description - Normal.  Tenderness - Non Tender.    Assessment & Plan ADENOCARCINOMA OF HEAD OF PANCREAS (C25.0) Impression: I will set up surgery for approximately 6 weeks status post radiation. He is scheduled to finish this week. I will get a restaging scan at 3-4 weeks post completion  of radiation.  As long as it is similar or improved, we would plan to proceed with attempted surgery. I discussed that we may be unable to resect the mass if it does have arterial involvement. Hopefully, this will have decreased significantly on the scan as he has had a clinical improvement and a improvement in his tumor markers.  I rereviewed surgery and risks. Once we have a date for the scan and a date for surgery, I will set up a follow-up appointment. That will be our final preoperative appointment.  I also discussed expectations for postoperative recovery. I discussed that the patient may be unrealistic in thinking that he can go back to work 4 weeks postop. At his job, he does not have a significant lifting requirement. The most he has to lift is around 25 pounds. I advised that he may be quite fatigued and that this may be too much for him. I also reviewed that [place] if he has a complication and is in the hospital longer, that he would not be ready for this date.  I discussed the patient with Worthy Flank, PA with radiation oncology. Current Plans Pt Education - flb whipple pt info You are being scheduled for surgery- Our schedulers will call you.  You should hear from our office's scheduling department within 5 working days about the location, date, and time of surgery. We try to make accommodations for patient's preferences in scheduling surgery, but sometimes the OR schedule or the surgeon's schedule prevents [place] from making those accommodations.  If you have not heard from our office [phone]) in 5 working days, call the office and ask for your surgeon's nurse.  If you have other questions about your diagnosis,  plan, or surgery, call the office and ask for your surgeon's nurse.    Signed by Stark Klein, MD

## 2016-09-14 LAB — HEMOGLOBIN A1C
Hgb A1c MFr Bld: 8.4 % — ABNORMAL HIGH (ref 4.8–5.6)
Mean Plasma Glucose: 194 mg/dL

## 2016-09-18 ENCOUNTER — Other Ambulatory Visit: Payer: Self-pay | Admitting: Endocrinology

## 2016-09-19 ENCOUNTER — Inpatient Hospital Stay (HOSPITAL_COMMUNITY): Payer: 59

## 2016-09-19 ENCOUNTER — Inpatient Hospital Stay (HOSPITAL_COMMUNITY)
Admission: RE | Admit: 2016-09-19 | Discharge: 2016-09-25 | DRG: 422 | Disposition: A | Discharge status: Home / Self Care | Payer: 59 | Source: Ambulatory Visit | Attending: General Surgery | Admitting: General Surgery

## 2016-09-19 ENCOUNTER — Encounter (HOSPITAL_COMMUNITY): Payer: Self-pay | Admitting: Urology

## 2016-09-19 ENCOUNTER — Inpatient Hospital Stay (HOSPITAL_COMMUNITY): Payer: 59 | Admitting: Anesthesiology

## 2016-09-19 ENCOUNTER — Inpatient Hospital Stay (HOSPITAL_COMMUNITY): Payer: 59 | Admitting: Vascular Surgery

## 2016-09-19 ENCOUNTER — Encounter (HOSPITAL_COMMUNITY)
Admission: RE | Disposition: A | Discharge status: Home / Self Care | Payer: Self-pay | Source: Ambulatory Visit | Attending: General Surgery

## 2016-09-19 DIAGNOSIS — Z9221 Personal history of antineoplastic chemotherapy: Secondary | ICD-10-CM | POA: Diagnosis not present

## 2016-09-19 DIAGNOSIS — K219 Gastro-esophageal reflux disease without esophagitis: Secondary | ICD-10-CM | POA: Diagnosis present

## 2016-09-19 DIAGNOSIS — C250 Malignant neoplasm of head of pancreas: Principal | ICD-10-CM | POA: Diagnosis present

## 2016-09-19 DIAGNOSIS — Z79899 Other long term (current) drug therapy: Secondary | ICD-10-CM | POA: Diagnosis not present

## 2016-09-19 DIAGNOSIS — Z923 Personal history of irradiation: Secondary | ICD-10-CM

## 2016-09-19 DIAGNOSIS — E785 Hyperlipidemia, unspecified: Secondary | ICD-10-CM | POA: Diagnosis present

## 2016-09-19 DIAGNOSIS — I10 Essential (primary) hypertension: Secondary | ICD-10-CM | POA: Diagnosis present

## 2016-09-19 DIAGNOSIS — Z85820 Personal history of malignant melanoma of skin: Secondary | ICD-10-CM | POA: Diagnosis not present

## 2016-09-19 DIAGNOSIS — Z452 Encounter for adjustment and management of vascular access device: Secondary | ICD-10-CM

## 2016-09-19 DIAGNOSIS — E119 Type 2 diabetes mellitus without complications: Secondary | ICD-10-CM | POA: Diagnosis present

## 2016-09-19 DIAGNOSIS — K7689 Other specified diseases of liver: Secondary | ICD-10-CM | POA: Diagnosis present

## 2016-09-19 DIAGNOSIS — E876 Hypokalemia: Secondary | ICD-10-CM | POA: Diagnosis present

## 2016-09-19 DIAGNOSIS — F329 Major depressive disorder, single episode, unspecified: Secondary | ICD-10-CM | POA: Diagnosis present

## 2016-09-19 DIAGNOSIS — Z794 Long term (current) use of insulin: Secondary | ICD-10-CM | POA: Diagnosis not present

## 2016-09-19 DIAGNOSIS — K4090 Unilateral inguinal hernia, without obstruction or gangrene, not specified as recurrent: Secondary | ICD-10-CM | POA: Diagnosis present

## 2016-09-19 DIAGNOSIS — Z981 Arthrodesis status: Secondary | ICD-10-CM | POA: Diagnosis not present

## 2016-09-19 HISTORY — PX: WHIPPLE PROCEDURE: SHX2667

## 2016-09-19 LAB — POCT I-STAT 7, (LYTES, BLD GAS, ICA,H+H)
Acid-Base Excess: 1 mmol/L (ref 0.0–2.0)
Bicarbonate: 26.8 mmol/L (ref 20.0–28.0)
Calcium, Ion: 1.22 mmol/L (ref 1.15–1.40)
HCT: 34 % — ABNORMAL LOW (ref 39.0–52.0)
Hemoglobin: 11.6 g/dL — ABNORMAL LOW (ref 13.0–17.0)
O2 Saturation: 100 %
Patient temperature: 35.5
Potassium: 2.9 mmol/L — ABNORMAL LOW (ref 3.5–5.1)
Sodium: 142 mmol/L (ref 135–145)
TCO2: 28 mmol/L (ref 0–100)
pCO2 arterial: 42 mmHg (ref 32.0–48.0)
pH, Arterial: 7.407 (ref 7.350–7.450)
pO2, Arterial: 307 mmHg — ABNORMAL HIGH (ref 83.0–108.0)

## 2016-09-19 LAB — TYPE AND SCREEN
Blood Product Expiration Date: 201803092359
Blood Product Expiration Date: 201803092359
Blood Product Expiration Date: 201803102359
Blood Product Expiration Date: 201803102359
ISSUE DATE / TIME: 201802122249
ISSUE DATE / TIME: 201802141842
ISSUE DATE / TIME: 201802161240
Unit Type and Rh: 6200
Unit Type and Rh: 6200
Unit Type and Rh: 6200
Unit Type and Rh: 6200

## 2016-09-19 LAB — GLUCOSE, CAPILLARY
Glucose-Capillary: 98 mg/dL (ref 65–99)
Glucose-Capillary: 103 mg/dL — ABNORMAL HIGH (ref 65–99)
Glucose-Capillary: 85 mg/dL (ref 65–99)
Glucose-Capillary: 116 mg/dL — ABNORMAL HIGH (ref 65–99)
Glucose-Capillary: 137 mg/dL — ABNORMAL HIGH (ref 65–99)
Glucose-Capillary: 128 mg/dL — ABNORMAL HIGH (ref 65–99)

## 2016-09-19 SURGERY — WHIPPLE PROCEDURE
Anesthesia: General | Site: Abdomen

## 2016-09-19 MED ORDER — MIDAZOLAM HCL 2 MG/2ML IJ SOLN
INTRAMUSCULAR | Status: AC
  Filled 2016-09-19: qty 2

## 2016-09-19 MED ORDER — ROCURONIUM BROMIDE 100 MG/10ML IV SOLN
INTRAVENOUS | Status: DC | PRN
  Administered 2016-09-19: 50 mg via INTRAVENOUS

## 2016-09-19 MED ORDER — PROMETHAZINE HCL 25 MG/ML IJ SOLN
6.2500 mg | INTRAMUSCULAR | Status: DC | PRN
  Administered 2016-09-19: 6.25 mg via INTRAVENOUS

## 2016-09-19 MED ORDER — ROPIVACAINE HCL 2 MG/ML IJ SOLN
2.0000 mL/h | INTRAMUSCULAR | Status: DC
  Administered 2016-09-20: 8 mL/h via EPIDURAL
  Administered 2016-09-21: 2 mL/h via EPIDURAL
  Filled 2016-09-19 (×3): qty 200

## 2016-09-19 MED ORDER — SENNA 8.6 MG PO TABS
1.0000 | ORAL_TABLET | Freq: Two times a day (BID) | ORAL | Status: DC
  Administered 2016-09-20 – 2016-09-25 (×8): 8.6 mg via ORAL
  Filled 2016-09-19 (×9): qty 1

## 2016-09-19 MED ORDER — PHENYLEPHRINE HCL 10 MG/ML IJ SOLN
INTRAMUSCULAR | Status: DC | PRN
  Administered 2016-09-19 (×4): 80 ug via INTRAVENOUS

## 2016-09-19 MED ORDER — HYDROMORPHONE 1 MG/ML IV SOLN
INTRAVENOUS | Status: DC
  Administered 2016-09-19: 2.4 mg via INTRAVENOUS
  Administered 2016-09-19: 11:00:00 via INTRAVENOUS
  Administered 2016-09-19: 0.6 mg via INTRAVENOUS
  Administered 2016-09-19: 3 mg via INTRAVENOUS
  Administered 2016-09-20: 2.4 mg via INTRAVENOUS
  Administered 2016-09-20: 1.5 mg via INTRAVENOUS
  Administered 2016-09-20: 0.3 mg via INTRAVENOUS
  Administered 2016-09-20: 0.6 mg via INTRAVENOUS
  Administered 2016-09-20: 4.8 mg via INTRAVENOUS
  Administered 2016-09-20: 2.7 mg via INTRAVENOUS
  Administered 2016-09-21: 1.8 mg via INTRAVENOUS
  Administered 2016-09-21 (×2): 0.6 mg via INTRAVENOUS
  Administered 2016-09-21: 2.1 mg via INTRAVENOUS
  Administered 2016-09-21: 1.8 mg via INTRAVENOUS
  Administered 2016-09-21: 0.9 mg via INTRAVENOUS
  Administered 2016-09-21: via INTRAVENOUS
  Administered 2016-09-22 (×2): 0.6 mg via INTRAVENOUS
  Administered 2016-09-22 (×3): 1.2 mg via INTRAVENOUS
  Administered 2016-09-23: 0.9 mg via INTRAVENOUS
  Administered 2016-09-23: 0.4 mg via INTRAVENOUS
  Administered 2016-09-23: 0.2 mg via INTRAVENOUS
  Filled 2016-09-19 (×4): qty 25

## 2016-09-19 MED ORDER — LORATADINE 10 MG PO TABS
10.0000 mg | ORAL_TABLET | Freq: Every day | ORAL | Status: DC
  Administered 2016-09-20 – 2016-09-25 (×6): 10 mg via ORAL
  Filled 2016-09-19 (×6): qty 1

## 2016-09-19 MED ORDER — BENAZEPRIL HCL 10 MG PO TABS
10.0000 mg | ORAL_TABLET | Freq: Every day | ORAL | Status: DC
  Administered 2016-09-20 – 2016-09-25 (×6): 10 mg via ORAL
  Filled 2016-09-19 (×6): qty 1

## 2016-09-19 MED ORDER — LACTATED RINGERS IV SOLN
INTRAVENOUS | Status: DC | PRN
  Administered 2016-09-19: 08:00:00 via INTRAVENOUS

## 2016-09-19 MED ORDER — SUGAMMADEX SODIUM 200 MG/2ML IV SOLN
INTRAVENOUS | Status: DC | PRN
  Administered 2016-09-19: 350 mg via INTRAVENOUS

## 2016-09-19 MED ORDER — ACETAMINOPHEN 325 MG PO TABS
650.0000 mg | ORAL_TABLET | Freq: Four times a day (QID) | ORAL | Status: DC | PRN
  Administered 2016-09-21 – 2016-09-24 (×2): 650 mg via ORAL
  Filled 2016-09-19 (×2): qty 2

## 2016-09-19 MED ORDER — CHLORHEXIDINE GLUCONATE CLOTH 2 % EX PADS: 6.0000 | MEDICATED_PAD | Freq: Once | CUTANEOUS | Status: DC

## 2016-09-19 MED ORDER — ONDANSETRON HCL 4 MG/2ML IJ SOLN: 4.0000 mg | Freq: Four times a day (QID) | INTRAMUSCULAR | Status: DC | PRN

## 2016-09-19 MED ORDER — KCL IN DEXTROSE-NACL 20-5-0.45 MEQ/L-%-% IV SOLN
INTRAVENOUS | Status: AC
  Filled 2016-09-19: qty 1000

## 2016-09-19 MED ORDER — DIPHENHYDRAMINE HCL 12.5 MG/5ML PO ELIX: 12.5000 mg | ORAL_SOLUTION | Freq: Four times a day (QID) | ORAL | Status: DC | PRN

## 2016-09-19 MED ORDER — ONDANSETRON HCL 4 MG/2ML IJ SOLN
INTRAMUSCULAR | Status: DC | PRN
  Administered 2016-09-19: 20 mg via INTRAVENOUS
  Administered 2016-09-19: 4 mg via INTRAVENOUS

## 2016-09-19 MED ORDER — HYDRALAZINE HCL 20 MG/ML IJ SOLN: 10.0000 mg | INTRAMUSCULAR | Status: DC | PRN

## 2016-09-19 MED ORDER — PROPOFOL 10 MG/ML IV BOLUS
INTRAVENOUS | Status: DC | PRN
  Administered 2016-09-19: 150 mg via INTRAVENOUS

## 2016-09-19 MED ORDER — ROPIVACAINE HCL 2 MG/ML IJ SOLN
2.0000 mL/h | INTRAMUSCULAR | Status: DC
  Filled 2016-09-19: qty 200

## 2016-09-19 MED ORDER — ROPIVACAINE HCL 2 MG/ML IJ SOLN: 8.0000 mL/h | INTRAMUSCULAR | Status: DC

## 2016-09-19 MED ORDER — MIDAZOLAM HCL 5 MG/5ML IJ SOLN
INTRAMUSCULAR | Status: DC | PRN
  Administered 2016-09-19: 1 mg via INTRAVENOUS

## 2016-09-19 MED ORDER — PANTOPRAZOLE SODIUM 40 MG PO TBEC
40.0000 mg | DELAYED_RELEASE_TABLET | Freq: Every day | ORAL | Status: DC
  Administered 2016-09-20 – 2016-09-25 (×6): 40 mg via ORAL
  Filled 2016-09-19 (×6): qty 1

## 2016-09-19 MED ORDER — ACETAMINOPHEN 500 MG PO TABS
ORAL_TABLET | ORAL | Status: AC
  Administered 2016-09-19: 1000 mg via ORAL
  Filled 2016-09-19: qty 2

## 2016-09-19 MED ORDER — PROPOFOL 10 MG/ML IV BOLUS
INTRAVENOUS | Status: AC
  Filled 2016-09-19: qty 20

## 2016-09-19 MED ORDER — ACETAMINOPHEN 650 MG RE SUPP: 650.0000 mg | Freq: Four times a day (QID) | RECTAL | Status: DC | PRN

## 2016-09-19 MED ORDER — ACETAMINOPHEN 500 MG PO TABS
1000.0000 mg | ORAL_TABLET | ORAL | Status: AC
  Administered 2016-09-19: 1000 mg via ORAL

## 2016-09-19 MED ORDER — LIDOCAINE HCL (PF) 1 % IJ SOLN
INTRAMUSCULAR | Status: AC
  Filled 2016-09-19: qty 30

## 2016-09-19 MED ORDER — ARTIFICIAL TEARS OP OINT
TOPICAL_OINTMENT | OPHTHALMIC | Status: DC | PRN
  Administered 2016-09-19: 1 via OPHTHALMIC

## 2016-09-19 MED ORDER — DIPHENHYDRAMINE HCL 50 MG/ML IJ SOLN: 12.5000 mg | Freq: Four times a day (QID) | INTRAMUSCULAR | Status: DC | PRN

## 2016-09-19 MED ORDER — SODIUM CHLORIDE 0.9 % IV SOLN
INTRAVENOUS | Status: DC
  Filled 2016-09-19: qty 2.5

## 2016-09-19 MED ORDER — BUPIVACAINE-EPINEPHRINE (PF) 0.5% -1:200000 IJ SOLN
INTRAMUSCULAR | Status: AC
  Filled 2016-09-19: qty 30

## 2016-09-19 MED ORDER — CEFAZOLIN SODIUM-DEXTROSE 2-4 GM/100ML-% IV SOLN
INTRAVENOUS | Status: AC
  Filled 2016-09-19: qty 100

## 2016-09-19 MED ORDER — ONDANSETRON 4 MG PO TBDP
4.0000 mg | ORAL_TABLET | Freq: Four times a day (QID) | ORAL | Status: DC | PRN
  Filled 2016-09-19: qty 1

## 2016-09-19 MED ORDER — LIDOCAINE HCL (PF) 1 % IJ SOLN
INTRAMUSCULAR | Status: DC | PRN
  Administered 2016-09-19: 30 mL

## 2016-09-19 MED ORDER — POTASSIUM CHLORIDE CRYS ER 20 MEQ PO TBCR
20.0000 meq | EXTENDED_RELEASE_TABLET | Freq: Every day | ORAL | Status: DC
  Administered 2016-09-20 – 2016-09-24 (×5): 20 meq via ORAL
  Filled 2016-09-19 (×5): qty 1

## 2016-09-19 MED ORDER — LACTATED RINGERS IV SOLN
INTRAVENOUS | Status: DC | PRN
  Administered 2016-09-19: 07:00:00 via INTRAVENOUS

## 2016-09-19 MED ORDER — DEXTROSE 5 % IV SOLN
INTRAVENOUS | Status: DC | PRN
  Administered 2016-09-19: 50 ug/min via INTRAVENOUS

## 2016-09-19 MED ORDER — ROPIVACAINE HCL 2 MG/ML IJ SOLN
8.0000 mL/h | INTRAMUSCULAR | Status: DC
  Filled 2016-09-19 (×2): qty 200

## 2016-09-19 MED ORDER — HYDROMORPHONE HCL 1 MG/ML IJ SOLN
0.5000 mg | INTRAMUSCULAR | Status: DC | PRN
  Administered 2016-09-20: 1 mg via INTRAVENOUS
  Filled 2016-09-19: qty 1

## 2016-09-19 MED ORDER — 0.9 % SODIUM CHLORIDE (POUR BTL) OPTIME
TOPICAL | Status: DC | PRN
  Administered 2016-09-19: 2000 mL

## 2016-09-19 MED ORDER — PROMETHAZINE HCL 25 MG/ML IJ SOLN
INTRAMUSCULAR | Status: AC
  Administered 2016-09-19: 6.25 mg via INTRAVENOUS
  Filled 2016-09-19: qty 1

## 2016-09-19 MED ORDER — INSULIN DETEMIR 100 UNIT/ML ~~LOC~~ SOLN
10.0000 [IU] | Freq: Two times a day (BID) | SUBCUTANEOUS | Status: DC
  Administered 2016-09-20 – 2016-09-25 (×11): 10 [IU] via SUBCUTANEOUS
  Filled 2016-09-19 (×14): qty 0.1

## 2016-09-19 MED ORDER — METHOCARBAMOL 500 MG PO TABS: 500.0000 mg | ORAL_TABLET | Freq: Four times a day (QID) | ORAL | Status: DC | PRN

## 2016-09-19 MED ORDER — CEFAZOLIN SODIUM-DEXTROSE 2-4 GM/100ML-% IV SOLN: 2.0000 g | Freq: Three times a day (TID) | INTRAVENOUS | Status: AC

## 2016-09-19 MED ORDER — ZOLPIDEM TARTRATE 5 MG PO TABS: 5.0000 mg | ORAL_TABLET | Freq: Every evening | ORAL | Status: DC | PRN

## 2016-09-19 MED ORDER — BISACODYL 10 MG RE SUPP: 10.0000 mg | Freq: Every day | RECTAL | Status: DC | PRN

## 2016-09-19 MED ORDER — INSULIN ASPART 100 UNIT/ML ~~LOC~~ SOLN
0.0000 [IU] | Freq: Three times a day (TID) | SUBCUTANEOUS | Status: DC
  Administered 2016-09-19 – 2016-09-20 (×2): 2 [IU] via SUBCUTANEOUS
  Administered 2016-09-20 – 2016-09-21 (×3): 3 [IU] via SUBCUTANEOUS
  Administered 2016-09-21 (×2): 2 [IU] via SUBCUTANEOUS
  Administered 2016-09-22: 5 [IU] via SUBCUTANEOUS
  Administered 2016-09-22 – 2016-09-23 (×2): 3 [IU] via SUBCUTANEOUS
  Administered 2016-09-23 (×2): 5 [IU] via SUBCUTANEOUS
  Administered 2016-09-24: 3 [IU] via SUBCUTANEOUS
  Administered 2016-09-24 (×2): 5 [IU] via SUBCUTANEOUS
  Administered 2016-09-25: 3 [IU] via SUBCUTANEOUS

## 2016-09-19 MED ORDER — AMLODIPINE BESYLATE 10 MG PO TABS
10.0000 mg | ORAL_TABLET | Freq: Every day | ORAL | Status: DC
  Administered 2016-09-20 – 2016-09-25 (×6): 10 mg via ORAL
  Filled 2016-09-19 (×6): qty 1

## 2016-09-19 MED ORDER — SODIUM CHLORIDE 0.9 % IV SOLN
INTRAVENOUS | Status: DC | PRN
  Administered 2016-09-19: .8 [IU]/h via INTRAVENOUS

## 2016-09-19 MED ORDER — LIDOCAINE HCL (CARDIAC) 20 MG/ML IV SOLN
INTRAVENOUS | Status: DC | PRN
  Administered 2016-09-19: 50 mg via INTRAVENOUS

## 2016-09-19 MED ORDER — KCL IN DEXTROSE-NACL 20-5-0.45 MEQ/L-%-% IV SOLN
INTRAVENOUS | Status: DC
  Administered 2016-09-19 – 2016-09-22 (×5): via INTRAVENOUS
  Filled 2016-09-19 (×7): qty 1000

## 2016-09-19 MED ORDER — FENTANYL CITRATE (PF) 100 MCG/2ML IJ SOLN
INTRAMUSCULAR | Status: AC
  Filled 2016-09-19: qty 4

## 2016-09-19 MED ORDER — ROPIVACAINE HCL 2 MG/ML IJ SOLN
INTRAMUSCULAR | Status: DC | PRN
  Administered 2016-09-19: 14 mL/h via EPIDURAL

## 2016-09-19 MED ORDER — LIDOCAINE-PRILOCAINE 2.5-2.5 % EX CREA: 1.0000 "application " | TOPICAL_CREAM | Freq: Every day | CUTANEOUS | Status: DC | PRN

## 2016-09-19 MED ORDER — BUPIVACAINE-EPINEPHRINE 0.5% -1:200000 IJ SOLN
INTRAMUSCULAR | Status: DC | PRN
  Administered 2016-09-19: 30 mL

## 2016-09-19 MED ORDER — CEFAZOLIN SODIUM-DEXTROSE 2-4 GM/100ML-% IV SOLN
2.0000 g | INTRAVENOUS | Status: AC
  Administered 2016-09-19: 2 g via INTRAVENOUS

## 2016-09-19 MED ORDER — FENTANYL CITRATE (PF) 100 MCG/2ML IJ SOLN: 25.0000 ug | INTRAMUSCULAR | Status: DC | PRN

## 2016-09-19 MED ORDER — FENTANYL CITRATE (PF) 100 MCG/2ML IJ SOLN
INTRAMUSCULAR | Status: DC | PRN
  Administered 2016-09-19: 25 ug via INTRAVENOUS
  Administered 2016-09-19: 50 ug via INTRAVENOUS
  Administered 2016-09-19: 25 ug via INTRAVENOUS
  Administered 2016-09-19: 100 ug via INTRAVENOUS

## 2016-09-19 MED ORDER — NALOXONE HCL 0.4 MG/ML IJ SOLN: 0.4000 mg | INTRAMUSCULAR | Status: DC | PRN

## 2016-09-19 MED ORDER — SODIUM CHLORIDE 0.9% FLUSH: 9.0000 mL | INTRAVENOUS | Status: DC | PRN

## 2016-09-19 SURGICAL SUPPLY — 102 items
BAG BILE T-TUBES STRL (MISCELLANEOUS) ×3 IMPLANT
BAG DRN 9.5 2 ADJ BELT ADPR (MISCELLANEOUS) ×1
BLADE SURG ROTATE 9660 (MISCELLANEOUS) ×2 IMPLANT
BOOT SUTURE AID YELLOW STND (SUTURE) ×6 IMPLANT
BRR ADH 5X3 SEPRAFILM 6 SHT (MISCELLANEOUS)
CANISTER SUCTION 2500CC (MISCELLANEOUS) ×3 IMPLANT
CATH KIT ON Q 7.5IN SLV (PAIN MANAGEMENT) ×2 IMPLANT
CATH ROBINSON RED A/P 16FR (CATHETERS) IMPLANT
CHLORAPREP W/TINT 26ML (MISCELLANEOUS) ×3 IMPLANT
CLIP LIGATING HEM O LOK PURPLE (MISCELLANEOUS) ×4 IMPLANT
CLIP LIGATING HEMO O LOK GREEN (MISCELLANEOUS) ×3 IMPLANT
CLIP LIGATING HEMOLOK MED (MISCELLANEOUS) ×3 IMPLANT
CLIP TI LARGE 6 (CLIP) ×3 IMPLANT
CLIP TI MEDIUM 24 (CLIP) ×3 IMPLANT
CONT SPEC 4OZ CLIKSEAL STRL BL (MISCELLANEOUS) ×3 IMPLANT
COVER MAYO STAND STRL (DRAPES) ×3 IMPLANT
COVER SURGICAL LIGHT HANDLE (MISCELLANEOUS) ×3 IMPLANT
DRAIN CHANNEL 19F RND (DRAIN) ×6 IMPLANT
DRAIN PENROSE 1/2X36 STERILE (WOUND CARE) IMPLANT
DRAPE LAPAROSCOPIC ABDOMINAL (DRAPES) ×3 IMPLANT
DRAPE WARM FLUID 44X44 (DRAPE) ×3 IMPLANT
DRSG COVADERM 4X10 (GAUZE/BANDAGES/DRESSINGS) IMPLANT
DRSG COVADERM 4X14 (GAUZE/BANDAGES/DRESSINGS) IMPLANT
DRSG COVADERM 4X6 (GAUZE/BANDAGES/DRESSINGS) IMPLANT
DRSG COVADERM 4X8 (GAUZE/BANDAGES/DRESSINGS) IMPLANT
DRSG TELFA 3X8 NADH (GAUZE/BANDAGES/DRESSINGS) IMPLANT
ELECT BLADE 4.0 EZ CLEAN MEGAD (MISCELLANEOUS) ×3
ELECT BLADE 6.5 EXT (BLADE) ×3 IMPLANT
ELECT CAUTERY BLADE 6.4 (BLADE) ×3 IMPLANT
ELECT REM PT RETURN 9FT ADLT (ELECTROSURGICAL) ×3
ELECTRODE BLDE 4.0 EZ CLN MEGD (MISCELLANEOUS) ×1 IMPLANT
ELECTRODE REM PT RTRN 9FT ADLT (ELECTROSURGICAL) ×1 IMPLANT
GAUZE SPONGE 4X4 12PLY STRL (GAUZE/BANDAGES/DRESSINGS) IMPLANT
GAUZE SPONGE 4X4 16PLY XRAY LF (GAUZE/BANDAGES/DRESSINGS) IMPLANT
GEL ULTRASOUND 20GR AQUASONIC (MISCELLANEOUS) ×3 IMPLANT
GLOVE BIO SURGEON STRL SZ 6 (GLOVE) ×6 IMPLANT
GLOVE BIO SURGEON STRL SZ7.5 (GLOVE) ×5 IMPLANT
GLOVE BIOGEL PI IND STRL 8 (GLOVE) ×1 IMPLANT
GLOVE BIOGEL PI INDICATOR 8 (GLOVE) ×2
GLOVE INDICATOR 6.5 STRL GRN (GLOVE) ×6 IMPLANT
GOWN STRL REUS W/ TWL LRG LVL3 (GOWN DISPOSABLE) ×1 IMPLANT
GOWN STRL REUS W/ TWL XL LVL3 (GOWN DISPOSABLE) ×1 IMPLANT
GOWN STRL REUS W/TWL 2XL LVL3 (GOWN DISPOSABLE) ×3 IMPLANT
GOWN STRL REUS W/TWL LRG LVL3 (GOWN DISPOSABLE) ×3
GOWN STRL REUS W/TWL XL LVL3 (GOWN DISPOSABLE) ×3
HEMOSTAT SURGICEL 2X14 (HEMOSTASIS) IMPLANT
KIT BASIN OR (CUSTOM PROCEDURE TRAY) ×3 IMPLANT
KIT MARKER MARGIN INK (KITS) ×3 IMPLANT
KIT ROOM TURNOVER OR (KITS) ×3 IMPLANT
KIT TOURNIQUET VASCULAR (KITS) ×3 IMPLANT
L-HOOK LAP DISP 36CM (ELECTROSURGICAL) ×3
LHOOK LAP DISP 36CM (ELECTROSURGICAL) IMPLANT
LOOP VESSEL MAXI BLUE (MISCELLANEOUS) ×3 IMPLANT
NDL BIOPSY 14X6 SOFT TISS (NEEDLE) IMPLANT
NEEDLE BIOPSY 14X6 SOFT TISS (NEEDLE) IMPLANT
NS IRRIG 1000ML POUR BTL (IV SOLUTION) ×6 IMPLANT
PACK GENERAL/GYN (CUSTOM PROCEDURE TRAY) ×3 IMPLANT
PAD ARMBOARD 7.5X6 YLW CONV (MISCELLANEOUS) ×6 IMPLANT
PAD DRESSING TELFA 3X8 NADH (GAUZE/BANDAGES/DRESSINGS) IMPLANT
PAD SHARPS MAGNETIC DISPOSAL (MISCELLANEOUS) IMPLANT
PLUG CATH AND CAP STER (CATHETERS) IMPLANT
RELOAD PROXIMATE 75MM BLUE (ENDOMECHANICALS) IMPLANT
RELOAD PROXIMATE 75MM GREEN (ENDOMECHANICALS) IMPLANT
RELOAD STAPLE 75 3.8 BLU REG (ENDOMECHANICALS) ×2 IMPLANT
RELOAD STAPLE 75 4.5 GRN THCK (ENDOMECHANICALS) IMPLANT
SEPRAFILM PROCEDURAL PACK 3X5 (MISCELLANEOUS) IMPLANT
SHEARS FOC LG CVD HARMONIC 17C (MISCELLANEOUS) ×1 IMPLANT
SPONGE INTESTINAL PEANUT (DISPOSABLE) IMPLANT
SPONGE LAP 18X18 X RAY DECT (DISPOSABLE) ×8 IMPLANT
SPONGE SURGIFOAM ABS GEL 100 (HEMOSTASIS) IMPLANT
STAPLER PROXIMATE 75MM BLUE (STAPLE) ×1 IMPLANT
STAPLER VISISTAT 35W (STAPLE) ×3 IMPLANT
SUCTION POOLE TIP (SUCTIONS) ×3 IMPLANT
SUT 5.0 PDS RB-1 (SUTURE)
SUT ETHILON 2 0 FS 18 (SUTURE) ×6 IMPLANT
SUT ETHILON 2 LR (SUTURE) IMPLANT
SUT PDS AB 1 TP1 96 (SUTURE) ×7 IMPLANT
SUT PDS AB 3-0 SH 27 (SUTURE) ×4 IMPLANT
SUT PDS AB 4-0 RB1 27 (SUTURE) ×7 IMPLANT
SUT PDS PLUS AB 5-0 RB-1 (SUTURE) ×5 IMPLANT
SUT PROLENE 3 0 SH 48 (SUTURE) ×6 IMPLANT
SUT PROLENE 4 0 RB 1 (SUTURE)
SUT PROLENE 4-0 RB1 .5 CRCL 36 (SUTURE) ×2 IMPLANT
SUT PROLENE 5 0 RB 1 DA (SUTURE) ×4 IMPLANT
SUT SILK 2 0 SH CR/8 (SUTURE) ×3 IMPLANT
SUT SILK 2 0 TIES 10X30 (SUTURE) ×3 IMPLANT
SUT SILK 3 0 SH CR/8 (SUTURE) ×3 IMPLANT
SUT SILK 3 0 TIES 10X30 (SUTURE) ×3 IMPLANT
SUT VIC AB 2-0 CT1 27 (SUTURE)
SUT VIC AB 2-0 CT1 TAPERPNT 27 (SUTURE) IMPLANT
SUT VIC AB 2-0 SH 18 (SUTURE) ×3 IMPLANT
SUT VIC AB 3-0 SH 18 (SUTURE) ×3 IMPLANT
SUT VIC AB 3-0 SH 27 (SUTURE)
SUT VIC AB 3-0 SH 27X BRD (SUTURE) ×1 IMPLANT
SUT VICRYL AB 2 0 TIES (SUTURE) ×2 IMPLANT
TAPE UMBILICAL 1/8 X36 TWILL (MISCELLANEOUS) IMPLANT
TOWEL OR 17X24 6PK STRL BLUE (TOWEL DISPOSABLE) ×3 IMPLANT
TOWEL OR 17X26 10 PK STRL BLUE (TOWEL DISPOSABLE) ×3 IMPLANT
TRAY FOLEY CATH 14FRSI W/METER (CATHETERS) ×3 IMPLANT
TUBE FEEDING 5FR 15 INCH (TUBING) IMPLANT
TUBE FEEDING 8FR 16IN STR KANG (MISCELLANEOUS) IMPLANT
TUNNELER SHEATH ON-Q 16GX12 DP (PAIN MANAGEMENT) ×1 IMPLANT

## 2016-09-19 NOTE — Transfer of Care (Signed)
Immediate Anesthesia Transfer of Care Note  Patient: Colton Bush  Procedure(s) Performed: Procedure(s): DIAGNOSTIC LAPAROSCOPY, LAPAROSCOPIC LIVER BIOPSY, RETROPERITONEAL EXPLORATION, INTRAOPERATIVE ULTRASOUND (N/A)  Patient Location: PACU  Anesthesia Type:General  Level of Consciousness: awake, alert  and oriented  Airway & Oxygen Therapy: Patient Spontanous Breathing and Patient connected to face mask oxygen  Post-op Assessment: Report given to RN, Post -op Vital signs reviewed and stable and Patient moving all extremities X 4  Post vital signs: Reviewed and stable  Last Vitals:  Vitals:   09/19/16 0616  BP: 138/69  Pulse: 62  Resp: 20  Temp: 36.7 C    Last Pain:  Vitals:   09/19/16 0616  TempSrc: Oral         Complications: No apparent anesthesia complications

## 2016-09-19 NOTE — Anesthesia Procedure Notes (Signed)
Central Venous Catheter Insertion Performed by: Catalina Gravel, anesthesiologist Start/End2/20/2018 7:08 AM, 09/19/2016 7:15 AM Patient location: Pre-op. Preanesthetic checklist: patient identified, IV checked, site marked, risks and benefits discussed, surgical consent, monitors and equipment checked, pre-op evaluation, timeout performed and anesthesia consent Position: Trendelenburg Lidocaine 1% used for infiltration and patient sedated Hand hygiene performed , maximum sterile barriers used  and Seldinger technique used Catheter size: 8 Fr Total catheter length 16. Central line was placed.Double lumen Procedure performed using ultrasound guided technique. Ultrasound Notes:anatomy identified, needle tip was noted to be adjacent to the nerve/plexus identified, no ultrasound evidence of intravascular and/or intraneural injection and image(s) printed for medical record Attempts: 1 Following insertion, dressing applied, line sutured and Biopatch. Post procedure assessment: blood return through all ports  Patient tolerated the procedure well with no immediate complications.

## 2016-09-19 NOTE — Addendum Note (Signed)
Addendum  created 09/19/16 2200 by Roberts Gaudy, MD   Order sets accessed

## 2016-09-19 NOTE — Anesthesia Postprocedure Evaluation (Addendum)
Anesthesia Post Note  Patient: CHANOCH FECHTER  Procedure(s) Performed: Procedure(s) (LRB): DIAGNOSTIC LAPAROSCOPY, LAPAROSCOPIC LIVER BIOPSY, RETROPERITONEAL EXPLORATION, INTRAOPERATIVE ULTRASOUND (N/A)  Patient location during evaluation: PACU Anesthesia Type: General Level of consciousness: awake and alert Pain management: pain level controlled Vital Signs Assessment: post-procedure vital signs reviewed and stable Respiratory status: spontaneous breathing, nonlabored ventilation, respiratory function stable and patient connected to nasal cannula oxygen Cardiovascular status: blood pressure returned to baseline and stable Postop Assessment: no signs of nausea or vomiting Anesthetic complications: no       Last Vitals:  Vitals:   09/19/16 1245 09/19/16 1315  BP: 107/64 102/60  Pulse: 87 86  Resp: 12 13  Temp:      Last Pain:  Vitals:   09/19/16 1245  TempSrc:   PainSc: Asleep                 Catalina Gravel

## 2016-09-19 NOTE — Interval H&P Note (Signed)
History and Physical Interval Note:  09/19/2016 7:25 AM  Colton Bush  has presented today for surgery, with the diagnosis of PANCREATIC CANCER  The various methods of treatment have been discussed with the patient and family. After consideration of risks, benefits and other options for treatment, the patient has consented to  Procedure(s): DIAGNOSTIC LAPAROSCOPY, POSSIBLE WHIPPLE PROCEDURE (N/A) as a surgical intervention .  The patient's history has been reviewed, patient examined, no change in status, stable for surgery.  I have reviewed the patient's chart and labs.  Questions were answered to the patient's satisfaction.     Delmont Prosch

## 2016-09-19 NOTE — Op Note (Signed)
PRE-OPERATIVE DIAGNOSIS:  Adenocarcinoma of pancreatic head, cT2N1M0  POST-OPERATIVE DIAGNOSIS:  Adenocarcinoma pancreatic head, [id number]  PROCEDURE:  Procedure(s): Diagnostic laparoscopy, laparoscopic liver biopsy, retroperitoneal exploration, intraoperative ultrasound  SURGEON:  Surgeon(s): Stark Klein, MD  ASSISTANT: Frederich Cha, MD  ANESTHESIA:   local, epidural and general  DRAINS: none   LOCAL MEDICATIONS USED:  BUPIVICAINE  and XYLOCAINE   SPECIMEN:  Source of Specimen:  liver surface biopsy  DISPOSITION OF SPECIMEN:  PATHOLOGY  COUNTS:  YES  DICTATION: .Dragon Dictation  PLAN OF CARE: Admit to inpatient   PATIENT DISPOSITION:  PACU - hemodynamically stable.  FINDINGS:  Mucinous nodule on liver surface, benign on frozen section, right inguinal hernia (indirect), unresectable primary cancer pancreatic head with encasement/invasion of the portal vein and invasion of superior mesenteric artery  EBL: <50 mL  PROCEDURE:  Patient was identified in the holding area and taken operating room where he was placed supine on the operating room table. General anesthesia was induced. A Foley catheter was placed. His left arm was tucked. His abdomen was prepped and draped in sterile fashion. A timeout was performed according to the. When all was correct, we continued.  The patient was placed into reverse number position and rotated to the right. Local was administered at the costal margin. A 5 mm incision was made with the 11 blade and a 5 mm Optiview trocar was placed under direct visualization. Pneumoperitoneum was achieved to a pressure of 15 mmHg. The abdomen was examined from this angle. There were no lesions seen anteriorly on the liver or on the peritoneal surface. An indirect right inguinal hernia was seen.  A second 5 mm port was placed in the right upper quadrant under direct visualization. The liver was then elevated. There was a lesion seen medial to the gallbladder on the  undersurface of the liver that appeared to be mucinous and firm. The laparoscopic biopsy forceps were used to take a specimen of this. There was an additional firm lesion on the undersurface of the left lateral segment that was biopsied. These were sent off for frozen section. The surface of the liver was cauterized. The frozen section returned negative.  A midline incision was made extending from the xiphoid to just below the umbilicus. The Bookwalter retractor was set up for assistance with visualization. The duodenum was kocherized. The tumor was not adherent to the retroperitoneum. The lesser sac was then entered. On the inferior border of the pancreas, the tumor felt very adherent to the vessels.  There was no plane between the mass and the vein.  The middle colic vein was distended.   The intraoperative ultrasound was then set up. The pancreatic head and pancreas were examined. The tumor appeared to be encasing the SMV/portal vein confluence with blurring of the interface. The tumor also appeared to be invading the superior mesenteric artery on the right lateral surface. This point, the procedure was aborted. The duodenum appeared to be patent, and it did not appear to have imminent obstruction. The patient was previously stented with a biliary stent.    The fascia was closed using running looped #1 PDS sutures. The skin was irrigated. The skin was reapproximated with skin staples. The wounds were cleaned, dried, and dressed with dry sterile dressings. Patient was allowed to emerge from anesthesia and was taken the PACU in stable condition. Needle, sponge, and instrument counts were correct 2.

## 2016-09-19 NOTE — H&P (View-Only) (Signed)
Colton Bush  Location: [facility] Surgery Patient #: [id number] DOB: 1955-11-30 Married / Language: English / Race: White Male   History of Present Illness  The patient is a 61 year old male who presents for a follow-up for Pancreatic cancer. Patient was referred by Dr. Loletha Carrow for diagnosis of adenocarcinoma of the pancreatic head in August 2017. He presented with jaundice, anorexia, fatigue, and weight loss. He was so tired, he was unable to go to work. He had lost around 50 pounds in 4 months, but regained back to baseline. He underwent ERCP and EUS. Plastic stent was placed. He had a 3.5 cm mass that appeared to invade the portal vein. He has some suspicious nodes as well. He had no evidence of metastatic disease on staging scans. He has no family history of cancer.   He had FOLFIRINOX chemotherapy and restaging scan in October showed continued involvement of portal vein and possible involvement of proper hepatic vein. He has subsequently had chemoradiation concurrent with xeloda. He is doing very well. His weight is stable and his blood sugars have also been stable. He denies pain. He works with [employer] and has been working full time.   CA 19-9 down to 51. At diagnosis, it was 1497.   CT 05/17/2016 IMPRESSION: 1. Highly indistinct pancreatic head mass, similar enhancement to the surrounding pancreas, but with abrupt truncation of the dorsal pancreatic duct. Biliary stent in place with mild intrahepatic biliary dilatation. Borderline enlarged peripancreatic lymph node an abnormally enlarged portacaval lymph node concerning for local metastatic disease. No liver metastatic lesion identified. There is peripancreatic stranding favoring pancreatitis, but a component of this stranding especially in the vicinity of the pancreatic head could represent local tumor extension adjacent to the duodenum, superior mesenteric vein, and proper hepatic artery, and  involvement of the structures is not excluded. 2. 10 mm hypodense lesion along the inferior splenic hilum, stable, nonspecific. 3. Diffuse gallbladder wall thickening is present and cholecystitis is not excluded. There is also sessile mural thickening in the gallbladder along the fundus up to 1 cm, and gallbladder mass is not readily excluded. The 4. Stable 4 mm anterior right upper lobe pulmonary nodule, no change from July. This may warrant observation. 5. Other imaging findings of potential clinical significance: Coronary atherosclerosis. Thoracolumbar scoliosis. Lower lumbar spondylosis and degenerative disc disease with foraminal impingement at all levels between L2 and S1. Aortoiliac atherosclerotic vascular disease.  Initial EUS August 2017 1. An irregular mass was identified in the pancreatic head. The mass was hypoechoic, heterogenous and mixed solid and cystic. The mass measured 35 mm in maximal cross-sectional diameter. Theendosonographic borders were poorly-defined. There was sonographic evidence suggesting invasion into the portal vein (manifested by interface loss of 27mm). The celiac trunk, SMA, SMV were not involved with the mass. The remainder of the pancreas was examined. The endosonographic appearance of parenchyma and the upstream pancreatic duct indicated duct dilation and a maximum duct diameter of 7 mm. Fine needle aspiration for cytology was performed. Color Doppler imaging was utilized prior to needle puncture to confirm a lack of significant vascular structures within the needle path. Three passes were made with the 25 gauge needle using a transduodenal approach. Some passes were made with a stylet. A cytotechnologist was present to evaluate the adequacy of the specimen. Final cytology results are pending. 2. One peripancreatic lymphnode, measuring 1.2cm across, directly abuting the above mass. 3. Previously placed plastic CBD stent in good position, CBD diameter  1.1cm currently.  4. Limited views of liver, spleen, splenic vessels were all normal.  Past Medical History: Diagnosis Date . Arthritis left hand . Bronchitis 1977 . Depression takes Cymbalta daily . Diabetes mellitus type II sees [name] . GERD (gastroesophageal reflux disease) takes Omeprazole daily . H/O hiatal hernia . Hyperlipidemia takes Zocor daily . Hypertension takes Amlodipine daily . Neck pain C4-7 stenosis and herniated disc . Neuromuscular disorder ([place]) hiatal hernia . Scoliosis slight . Spinal cord injury, C5-C7 ([place]) c4-c7 . Stiffness of hand joint d/t cervical issues    Past Surgical History: Procedure Laterality Date . ANTERIOR CERVICAL DECOMP/DISCECTOMY FUSION 08/18/2011 Procedure: ANTERIOR CERVICAL DECOMPRESSION/DISCECTOMY FUSION 3 LEVELS; Surgeon: Winfield Cunas, MD; Location: [place] [facility]; Service: Neurosurgery; Laterality: N/A; Anterior Cervical Four-Five/Five-Six/Six-Seven Decompression with Fusion, Plating, and Bonegraft . CARPAL TUNNEL RELEASE 2013 bilateral, per Dr. Christella Noa . COLONOSCOPY 10-30-14 per Dr. Olevia Perches, clear, repeat in 10 yrs . egd with esophageal dilation 9-08 per Dr. Olevia Perches . lymph nodes biopsy . melanoma rt calf 1999 . SPINE SURGERY . TONSILLECTOMY as a child . ULNAR TUNNEL RELEASE 2013 right arm, per Dr. Christella Noa . UPPER GASTROINTESTINAL ENDOSCOPY  SH never smoker. no alcohol or drug use, Married.     Allergies No Known Drug Allergies  Medication History AmLODIPine Besylate (10MG  Tablet, Oral) Active. Benazepril HCl (10MG  Tablet, Oral) Active. Docusate Sodium (100MG  Capsule, Oral) Active. GlipiZIDE ER (10MG  Tablet ER 24HR, Oral) Active. Omeprazole (20MG  Capsule DR, Oral) Active. Fish Oil (Oral) Specific strength unknown - Active. Levemir (100UNIT/ML Solution, Subcutaneous) Active. NovoLOG FlexPen (100UNIT/ML Soln Pen-inj, Subcutaneous) Active. Multivitamin (Oral) Active. Potassium Chloride  (Oral) Specific strength unknown - Active. Xeloda (Oral) Specific strength unknown - Active. Medications Reconciled    Review of Systems All other systems negative  Vitals Weight: 200 lb Height: 70in Body Surface Area: 2.09 m Body Mass Index: 28.7 kg/m  Temp.: 96.79F  Pulse: 75 (Regular)  BP: 134/78 (Sitting, Left Arm, Standard)       Physical Exam General Mental Status-Alert. General Appearance-Consistent with stated age. Hydration-Well hydrated. Voice-Normal.  Head and Neck Head-normocephalic, atraumatic with no lesions or palpable masses.  Eye Sclera/Conjunctiva - Bilateral-No scleral icterus.  Chest and Lung Exam Chest and lung exam reveals -quiet, even and easy respiratory effort with no use of accessory muscles. Inspection Chest Wall - Normal. Back - normal.  Breast - Did not examine.  Cardiovascular Cardiovascular examination reveals -normal pedal pulses bilaterally. Note: regular rate and rhythm  Abdomen Inspection-Inspection Normal. Palpation/Percussion Palpation and Percussion of the abdomen reveal - Soft, Non Tender, No Rebound tenderness, No Rigidity (guarding) and No hepatosplenomegaly.  Peripheral Vascular Upper Extremity Inspection - Bilateral - Normal - No Clubbing, No Cyanosis, No Edema, Pulses Intact. Lower Extremity Palpation - Edema - Bilateral - No edema.  Neurologic Neurologic evaluation reveals -alert and oriented x 3 with no impairment of recent or remote memory. Mental Status-Normal.  Musculoskeletal Global Assessment -Note: no gross deformities.  Normal Exam - Left-Upper Extremity Strength Normal and Lower Extremity Strength Normal. Normal Exam - Right-Upper Extremity Strength Normal and Lower Extremity Strength Normal.  Lymphatic Head & Neck  General Head & Neck Lymphatics: Bilateral - Description - Normal. Axillary  General Axillary Region: Bilateral - Description - Normal.  Tenderness - Non Tender.    Assessment & Plan ADENOCARCINOMA OF HEAD OF PANCREAS (C25.0) Impression: I will set up surgery for approximately 6 weeks status post radiation. He is scheduled to finish this week. I will get a restaging scan at 3-4 weeks post completion  of radiation.  As long as it is similar or improved, we would plan to proceed with attempted surgery. I discussed that we may be unable to resect the mass if it does have arterial involvement. Hopefully, this will have decreased significantly on the scan as he has had a clinical improvement and a improvement in his tumor markers.  I rereviewed surgery and risks. Once we have a date for the scan and a date for surgery, I will set up a follow-up appointment. That will be our final preoperative appointment.  I also discussed expectations for postoperative recovery. I discussed that the patient may be unrealistic in thinking that he can go back to work 4 weeks postop. At his job, he does not have a significant lifting requirement. The most he has to lift is around 25 pounds. I advised that he may be quite fatigued and that this may be too much for him. I also reviewed that [place] if he has a complication and is in the hospital longer, that he would not be ready for this date.  I discussed the patient with Worthy Flank, PA with radiation oncology. Current Plans Pt Education - flb whipple pt info You are being scheduled for surgery- Our schedulers will call you.  You should hear from our office's scheduling department within 5 working days about the location, date, and time of surgery. We try to make accommodations for patient's preferences in scheduling surgery, but sometimes the OR schedule or the surgeon's schedule prevents [place] from making those accommodations.  If you have not heard from our office [phone]) in 5 working days, call the office and ask for your surgeon's nurse.  If you have other questions about your diagnosis,  plan, or surgery, call the office and ask for your surgeon's nurse.    Signed by Stark Klein, MD

## 2016-09-19 NOTE — Anesthesia Preprocedure Evaluation (Addendum)
Anesthesia Evaluation  Patient identified by MRN, date of birth, ID band Patient awake    Reviewed: Allergy & Precautions, NPO status , Patient's Chart, lab work & pertinent test results, reviewed documented beta blocker date and time   Airway Mallampati: II  TM Distance: >3 FB Neck ROM: Full    Dental no notable dental hx. (+) Dental Advisory Given, Upper Dentures, Partial Lower   Pulmonary neg pulmonary ROS,    Pulmonary exam normal breath sounds clear to auscultation       Cardiovascular hypertension, Pt. on medications Normal cardiovascular exam Rhythm:Regular Rate:Normal     Neuro/Psych PSYCHIATRIC DISORDERS Anxiety Depression S/p ACDF C4-7 negative neurological ROS     GI/Hepatic Neg liver ROS, hiatal hernia, GERD  Medicated and Controlled,Pancreatic cancer   Endo/Other  diabetes, Type 2, Insulin Dependent  Renal/GU negative Renal ROS  negative genitourinary   Musculoskeletal  (+) Arthritis ,   Abdominal   Peds negative pediatric ROS (+)  Hematology  (+) Blood dyscrasia, anemia , Plt 243k   Anesthesia Other Findings   Reproductive/Obstetrics negative OB ROS                            Anesthesia Physical  Anesthesia Plan  ASA: III  Anesthesia Plan: General and Epidural   Post-op Pain Management:    Induction: Intravenous  Airway Management Planned: Oral ETT  Additional Equipment: Arterial line and CVP  Intra-op Plan:   Post-operative Plan: Extubation in OR  Informed Consent: I have reviewed the patients History and Physical, chart, labs and discussed the procedure including the risks, benefits and alternatives for the proposed anesthesia with the patient or authorized representative who has indicated his/her understanding and acceptance.   Dental advisory given and Dental Advisory Given  Plan Discussed with: CRNA, Anesthesiologist and Surgeon  Anesthesia Plan  Comments: (Risks/benefits of general anesthesia discussed with patient including risk of damage to teeth, lips, gum, and tongue, nausea/vomiting, allergic reactions to medications, and the possibility of heart attack, stroke and death.  All patient questions answered.  Patient wishes to proceed.)       Anesthesia Quick Evaluation

## 2016-09-19 NOTE — Anesthesia Procedure Notes (Addendum)
Epidural Patient location during procedure: pre-op Start time: 09/19/2016 7:17 AM End time: 09/19/2016 7:30 AM  Staffing Anesthesiologist: Catalina Gravel Performed: anesthesiologist   Preanesthetic Checklist Completed: patient identified, surgical consent, pre-op evaluation, timeout performed, IV checked, risks and benefits discussed and monitors and equipment checked  Epidural Patient position: sitting Prep: DuraPrep Patient monitoring: blood pressure and continuous pulse ox Approach: midline Location: thoracic (1-12) (T11) Injection technique: LOR air  Needle:  Needle type: Tuohy  Needle gauge: 17 G Needle length: 9 cm Needle insertion depth: 7 cm Catheter size: 19 Gauge Catheter at skin depth: 13 cm Test dose: 1.5% lidocaine with Epi 1:200 K and negative  Additional Notes Patient identified.  Risk benefits discussed including failed block, incomplete pain control, headache, nerve damage, paralysis, blood pressure changes, nausea, vomiting, reactions to medication both toxic or allergic, and postpartum back pain.  Patient expressed understanding and wished to proceed.  All questions were answered.  Sterile technique used throughout procedure and epidural site dressed with sterile barrier dressing. No paresthesia or other complications noted. The patient did not experience any signs of intravascular injection such as tinnitus or metallic taste in mouth nor signs of intrathecal spread such as rapid motor block. Reason for block:at surgeon's request and post-op pain management

## 2016-09-19 NOTE — Progress Notes (Signed)
RN concerned about possible wrong order being discontinued. Paged on-call MD for anesthesiology and spoke with staff in department on return call. Rn was given number to talk directly with anesthesiologist. Told him of concern over the change in ml/hr of the ropivacaine and that the original order for 78ml/hr had been discontinued. PACU report was that he was receiving 48ml/hr and patient arrived on floor with that dose running. MD stated that RN was to continue at the 56ml/hr and that he would be up to the floor this evening to check on the patient and he would put in a new order for the 40ml/hr dosage. Rn will pass on in report.

## 2016-09-19 NOTE — Anesthesia Procedure Notes (Signed)
Procedure Name: Intubation Date/Time: 09/19/2016 8:05 AM Performed by: Neldon Newport Pre-anesthesia Checklist: Timeout performed, Patient being monitored, Suction available, Emergency Drugs available and Patient identified Patient Re-evaluated:Patient Re-evaluated prior to inductionOxygen Delivery Method: Circle system utilized Preoxygenation: Pre-oxygenation with 100% oxygen Intubation Type: IV induction Ventilation: Mask ventilation without difficulty Laryngoscope Size: Mac and 4 Grade View: Grade I Tube type: Oral Tube size: 7.5 mm Number of attempts: 1 Placement Confirmation: breath sounds checked- equal and bilateral,  positive ETCO2 and ETT inserted through vocal cords under direct vision Secured at: 23 cm Tube secured with: Tape Dental Injury: Teeth and Oropharynx as per pre-operative assessment

## 2016-09-20 ENCOUNTER — Encounter (HOSPITAL_COMMUNITY): Payer: Self-pay | Admitting: Anesthesiology

## 2016-09-20 ENCOUNTER — Encounter (HOSPITAL_COMMUNITY): Payer: Self-pay | Admitting: General Surgery

## 2016-09-20 LAB — PROTIME-INR
INR: 1.28
Prothrombin Time: 16.1 seconds — ABNORMAL HIGH (ref 11.4–15.2)

## 2016-09-20 LAB — GLUCOSE, CAPILLARY
Glucose-Capillary: 113 mg/dL — ABNORMAL HIGH (ref 65–99)
Glucose-Capillary: 173 mg/dL — ABNORMAL HIGH (ref 65–99)
Glucose-Capillary: 187 mg/dL — ABNORMAL HIGH (ref 65–99)
Glucose-Capillary: 148 mg/dL — ABNORMAL HIGH (ref 65–99)
Glucose-Capillary: 111 mg/dL — ABNORMAL HIGH (ref 65–99)

## 2016-09-20 LAB — COMPREHENSIVE METABOLIC PANEL
ALT: 40 U/L (ref 17–63)
AST: 32 U/L (ref 15–41)
Albumin: 3.3 g/dL — ABNORMAL LOW (ref 3.5–5.0)
Alkaline Phosphatase: 208 U/L — ABNORMAL HIGH (ref 38–126)
Anion gap: 12 (ref 5–15)
BUN: 8 mg/dL (ref 6–20)
CO2: 23 mmol/L (ref 22–32)
Calcium: 8.7 mg/dL — ABNORMAL LOW (ref 8.9–10.3)
Chloride: 103 mmol/L (ref 101–111)
Creatinine, Ser: 0.75 mg/dL (ref 0.61–1.24)
GFR calc Af Amer: >60 mL/min (ref >60)
GFR calc non Af Amer: >60 mL/min (ref >60)
Glucose, Bld: 166 mg/dL — ABNORMAL HIGH (ref 65–99)
Potassium: 3.1 mmol/L — ABNORMAL LOW (ref 3.5–5.1)
Sodium: 138 mmol/L (ref 135–145)
Total Bilirubin: 1.1 mg/dL (ref 0.3–1.2)
Total Protein: 6.8 g/dL (ref 6.5–8.1)

## 2016-09-20 LAB — CBC
HCT: 35.5 % — ABNORMAL LOW (ref 39.0–52.0)
Hemoglobin: 11.8 g/dL — ABNORMAL LOW (ref 13.0–17.0)
MCH: 29.8 pg (ref 26.0–34.0)
MCHC: 33.2 g/dL (ref 30.0–36.0)
MCV: 89.6 fL (ref 78.0–100.0)
Platelets: 194 10*3/uL (ref 150–400)
RBC: 3.96 MIL/uL — ABNORMAL LOW (ref 4.22–5.81)
RDW: 16.2 % — ABNORMAL HIGH (ref 11.5–15.5)
WBC: 9.8 10*3/uL (ref 4.0–10.5)

## 2016-09-20 MED ORDER — HEPARIN SOD (PORK) LOCK FLUSH 100 UNIT/ML IV SOLN: 500.0000 [IU] | INTRAVENOUS | Status: DC | PRN

## 2016-09-20 MED ORDER — HEPARIN SOD (PORK) LOCK FLUSH 100 UNIT/ML IV SOLN
500.0000 [IU] | Freq: Once | INTRAVENOUS | Status: AC
  Administered 2016-09-20: 500 [IU] via INTRAVENOUS
  Filled 2016-09-20: qty 5

## 2016-09-20 NOTE — Care Management Note (Signed)
Case Management Note  Patient Details  Name: VARNEY HAAF MRN: [id number] Date of Birth: 1956/01/04  Subjective/Objective:  S/p lap liver bx, retroperitoneal exploration, intraoperative ultrasound.  He lives with spouse at home, pta indep, has pcp Dr. Cherlynn Perches, he has medication coverage and transportation at dc.  NCM will cont to follow for dc needs.                   Action/Plan:   Expected Discharge Date:                  Expected Discharge Plan:  Home/Self Care  In-House Referral:     Discharge planning Services  CM Consult  Post Acute Care Choice:    Choice offered to:     DME Arranged:    DME Agency:     HH Arranged:    HH Agency:     Status of Service:  In process, will continue to follow  If discussed at Long Length of Stay Meetings, dates discussed:    Additional Comments:  Zenon Mayo, RN 09/20/2016, 5:19 PM

## 2016-09-20 NOTE — Progress Notes (Signed)
1 Day Post-Op  Subjective: Sore.    Objective: Vital signs in last 24 hours: Temp:  [97.3 F (36.3 C)-99.6 F (37.6 C)] 98.5 F (36.9 C) (02/21 0720) Pulse Rate:  [61-98] 89 (02/21 0720) Resp:  [10-18] 15 (02/21 0720) BP: (90-154)/(53-75) 154/73 (02/21 0720) SpO2:  [92 %-100 %] 95 % (02/21 0720) Arterial Line BP: (92-181)/(40-68) 181/68 (02/21 0720) Weight:  [91.2 kg (201 lb 1 oz)] 91.2 kg (201 lb 1 oz) (02/20 1915)    Intake/Output from previous day: 02/20 0701 - 02/21 0700 In: 4308 [P.O.:240; I.V.:3850; NG/GT:90] Out: 2075 [Urine:1675; Emesis/NG output:300; Blood:100] Intake/Output this shift: No intake/output data recorded.  General appearance: alert, cooperative and mild distress Resp: breathing comfortably Cardio: regular rate and rhythm GI: soft, non distended, approp tender.  Lab Results:   Recent Labs  09/19/16 0842 09/20/16 0241  WBC  --  9.8  HGB 11.6* 11.8*  HCT 34.0* 35.5*  PLT  --  194   BMET  Recent Labs  09/19/16 0842 09/20/16 0241  NA 142 138  K 2.9* 3.1*  CL  --  103  CO2  --  23  GLUCOSE  --  166*  BUN  --  8  CREATININE  --  0.75  CALCIUM  --  8.7*   PT/INR  Recent Labs  09/20/16 0241  LABPROT 16.1*  INR 1.28   ABG  Recent Labs  09/19/16 0842  PHART 7.407  HCO3 26.8    Studies/Results: Dg Chest Port 1 View  Result Date: 09/19/2016 CLINICAL DATA:  Central line placement.  Postop appendectomy. EXAM: PORTABLE CHEST 1 VIEW COMPARISON:  CT 09/04/2016.  Chest x-ray 03/22/2016. FINDINGS: PowerPort catheter noted with tip at cavoatrial junction. NG tube noted with tip below left hemidiaphragm. Right IJ line noted with tip over the superior vena cava. Cardiomegaly with normal pulmonary vascularity. Bibasilar subsegmental atelectasis. No prominent pleural effusion or pneumothorax. Free intraperitoneal air noted consistent with prior intra-abdominal surgery. Prior cervical spine fusion. Thoracolumbar spine scoliosis . IMPRESSION: 1.  Lines and tubes noted as above. 2. Mild bibasilar subsegmental atelectasis. 3. Cardiomegaly, no pulmonary venous congestion. 4. Free intraperitoneal air consistent with prior surgery. Electronically Signed   By: Marcello Moores  Register   On: 09/19/2016 11:00    Anti-infectives: Anti-infectives    Start     Dose/Rate Route Frequency Ordered Stop   09/19/16 1400  ceFAZolin (ANCEF) IVPB 2g/100 mL premix     2 g 200 mL/hr over 30 Minutes Intravenous Every 8 hours 09/19/16 1045 09/19/16 2159   09/19/16 0545  ceFAZolin (ANCEF) 2-4 GM/100ML-% IVPB    Comments:  Rosenberger, Meredit: cabinet override      09/19/16 0545 09/19/16 0759   09/19/16 0543  ceFAZolin (ANCEF) IVPB 2g/100 mL premix     2 g 200 mL/hr over 30 Minutes Intravenous On call to O.R. 09/19/16 0543 09/19/16 0759      Assessment/Plan: s/p Procedure(s): DIAGNOSTIC LAPAROSCOPY, LAPAROSCOPIC LIVER BIOPSY, RETROPERITONEAL EXPLORATION, INTRAOPERATIVE ULTRASOUND (N/A) d/c NGT  Leave epidural Ice chips.   LOS: 1 day    [facility] 09/20/2016

## 2016-09-20 NOTE — Anesthesia Post-op Follow-up Note (Signed)
  Anesthesia Pain Follow-up Note  Patient: Colton Bush  Day #: 1  Date of Follow-up: 09/20/2016 Time: 2:18 PM  Last Vitals:  Vitals:   09/20/16 1131 09/20/16 1200  BP: (!) 144/78   Pulse: 82   Resp: 12 16  Temp: 37 C     Level of Consciousness: alert  Pain: mild   Side Effects:None  Catheter Site Exam:clean, dry  Epidural / Intrathecal    Start     Dose/Rate Route Frequency Ordered Stop   09/19/16 0845  ropivacaine (PF) 2 mg/mL (0.2%) (NAROPIN) injection     2 mL/hr 2 mL/hr  Epidural Continuous 09/19/16 0835         Plan: Continue current therapy of postop epidural at surgeon's request  Adilyn Humes DANIEL

## 2016-09-21 LAB — GLUCOSE, CAPILLARY
Glucose-Capillary: 159 mg/dL — ABNORMAL HIGH (ref 65–99)
Glucose-Capillary: 148 mg/dL — ABNORMAL HIGH (ref 65–99)
Glucose-Capillary: 130 mg/dL — ABNORMAL HIGH (ref 65–99)
Glucose-Capillary: 154 mg/dL — ABNORMAL HIGH (ref 65–99)

## 2016-09-21 LAB — CBC
HCT: 34.8 % — ABNORMAL LOW (ref 39.0–52.0)
Hemoglobin: 11.6 g/dL — ABNORMAL LOW (ref 13.0–17.0)
MCH: 29.8 pg (ref 26.0–34.0)
MCHC: 33.3 g/dL (ref 30.0–36.0)
MCV: 89.5 fL (ref 78.0–100.0)
Platelets: 169 10*3/uL (ref 150–400)
RBC: 3.89 MIL/uL — ABNORMAL LOW (ref 4.22–5.81)
RDW: 16.1 % — ABNORMAL HIGH (ref 11.5–15.5)
WBC: 9.9 10*3/uL (ref 4.0–10.5)

## 2016-09-21 LAB — COMPREHENSIVE METABOLIC PANEL
ALT: 28 U/L (ref 17–63)
AST: 23 U/L (ref 15–41)
Albumin: 2.9 g/dL — ABNORMAL LOW (ref 3.5–5.0)
Alkaline Phosphatase: 175 U/L — ABNORMAL HIGH (ref 38–126)
Anion gap: 9 (ref 5–15)
BUN: 5 mg/dL — ABNORMAL LOW (ref 6–20)
CO2: 25 mmol/L (ref 22–32)
Calcium: 8.6 mg/dL — ABNORMAL LOW (ref 8.9–10.3)
Chloride: 102 mmol/L (ref 101–111)
Creatinine, Ser: 0.62 mg/dL (ref 0.61–1.24)
GFR calc Af Amer: >60 mL/min (ref >60)
GFR calc non Af Amer: >60 mL/min (ref >60)
Glucose, Bld: 154 mg/dL — ABNORMAL HIGH (ref 65–99)
Potassium: 3.4 mmol/L — ABNORMAL LOW (ref 3.5–5.1)
Sodium: 136 mmol/L (ref 135–145)
Total Bilirubin: 0.8 mg/dL (ref 0.3–1.2)
Total Protein: 6.3 g/dL — ABNORMAL LOW (ref 6.5–8.1)

## 2016-09-21 NOTE — Progress Notes (Signed)
2 Days Post-Op  Subjective: Tolerated NGT removal.  No n/v.  Very tired.    Objective: Vital signs in last 24 hours: Temp:  [98.5 F (36.9 C)-99.7 F (37.6 C)] 98.7 F (37.1 C) (02/22 0758) Pulse Rate:  [80-98] 86 (02/22 0758) Resp:  [10-19] 19 (02/22 1054) BP: (115-153)/(68-82) 153/82 (02/22 0758) SpO2:  [90 %-98 %] 96 % (02/22 1054) Weight:  [89.5 kg (197 lb 5 oz)] 89.5 kg (197 lb 5 oz) (02/22 0445) Last BM Date: 09/21/16  Intake/Output from previous day: 02/21 0701 - 02/22 0700 In: 432 [I.V.:400] Out: 2150 [Urine:2050; Emesis/NG output:100] Intake/Output this shift: No intake/output data recorded.  General appearance: alert, cooperative and mild distress Resp: breathing comfortably Cardio: regular rate and rhythm GI: soft, non distended, approp tender.   Lab Results:   Recent Labs  09/20/16 0241 09/21/16 0301  WBC 9.8 9.9  HGB 11.8* 11.6*  HCT 35.5* 34.8*  PLT 194 169   BMET  Recent Labs  09/20/16 0241 09/21/16 0301  NA 138 136  K 3.1* 3.4*  CL 103 102  CO2 23 25  GLUCOSE 166* 154*  BUN 8 5*  CREATININE 0.75 0.62  CALCIUM 8.7* 8.6*   PT/INR  Recent Labs  09/20/16 0241  LABPROT 16.1*  INR 1.28   ABG  Recent Labs  09/19/16 0842  PHART 7.407  HCO3 26.8    Studies/Results: No results found.  Anti-infectives: Anti-infectives    Start     Dose/Rate Route Frequency Ordered Stop   09/19/16 1400  ceFAZolin (ANCEF) IVPB 2g/100 mL premix     2 g 200 mL/hr over 30 Minutes Intravenous Every 8 hours 09/19/16 1045 09/19/16 2159   09/19/16 0545  ceFAZolin (ANCEF) 2-4 GM/100ML-% IVPB    Comments:  Rosenberger, Meredit: cabinet override      09/19/16 0545 09/19/16 0759   09/19/16 0543  ceFAZolin (ANCEF) IVPB 2g/100 mL premix     2 g 200 mL/hr over 30 Minutes Intravenous On call to O.R. 09/19/16 0543 09/19/16 0759      Assessment/Plan: s/p Procedure(s): DIAGNOSTIC LAPAROSCOPY, LAPAROSCOPIC LIVER BIOPSY, RETROPERITONEAL EXPLORATION,  INTRAOPERATIVE ULTRASOUND (N/A) Clear liquids today. Ambulate. Anticipate d/c epidural tomorrow.    LOS: 2 days    [name] 09/21/2016

## 2016-09-21 NOTE — Anesthesia Post-op Follow-up Note (Signed)
  Anesthesia Pain Follow-up Note  Patient: Colton Bush  Day #: 2  Date of Follow-up: 09/21/2016 Time: 1:22 PM  Last Vitals:  Vitals:   09/21/16 1200 09/21/16 1231  BP:  (!) 184/83  Pulse:  98  Resp: 18 15  Temp:  37.8 C    Level of Consciousness: alert  Pain: mild   Side Effects:None  Catheter Site Exam:clean, dry  Epidural / Intrathecal    Start     Dose/Rate Route Frequency Ordered Stop   09/19/16 0845  ropivacaine (PF) 2 mg/mL (0.2%) (NAROPIN) injection     2 mL/hr 2 mL/hr  Epidural Continuous 09/19/16 0835         Plan: Continue current therapy of postop epidural at surgeon's request  Wanona Stare,JAMES TERRILL

## 2016-09-22 ENCOUNTER — Telehealth: Payer: Self-pay | Admitting: *Deleted

## 2016-09-22 LAB — COMPREHENSIVE METABOLIC PANEL
ALT: 24 U/L (ref 17–63)
AST: 22 U/L (ref 15–41)
Albumin: 2.7 g/dL — ABNORMAL LOW (ref 3.5–5.0)
Alkaline Phosphatase: 184 U/L — ABNORMAL HIGH (ref 38–126)
Anion gap: 8 (ref 5–15)
BUN: <5 mg/dL — ABNORMAL LOW (ref 6–20)
CO2: 28 mmol/L (ref 22–32)
Calcium: 8.7 mg/dL — ABNORMAL LOW (ref 8.9–10.3)
Chloride: 100 mmol/L — ABNORMAL LOW (ref 101–111)
Creatinine, Ser: 0.6 mg/dL — ABNORMAL LOW (ref 0.61–1.24)
GFR calc Af Amer: >60 mL/min (ref >60)
GFR calc non Af Amer: >60 mL/min (ref >60)
Glucose, Bld: 140 mg/dL — ABNORMAL HIGH (ref 65–99)
Potassium: 3.5 mmol/L (ref 3.5–5.1)
Sodium: 136 mmol/L (ref 135–145)
Total Bilirubin: 0.9 mg/dL (ref 0.3–1.2)
Total Protein: 6.4 g/dL — ABNORMAL LOW (ref 6.5–8.1)

## 2016-09-22 LAB — TYPE AND SCREEN
Blood Product Expiration Date: 201803012359
Blood Product Expiration Date: 201803102359
Blood Product Expiration Date: 201803112359
Blood Product Expiration Date: 201803112359
ISSUE DATE / TIME: 201802140924
ISSUE DATE / TIME: 201802161240
Unit Type and Rh: 6200
Unit Type and Rh: 6200
Unit Type and Rh: 6200
Unit Type and Rh: 6200

## 2016-09-22 LAB — CBC
HCT: 35 % — ABNORMAL LOW (ref 39.0–52.0)
Hemoglobin: 12 g/dL — ABNORMAL LOW (ref 13.0–17.0)
MCH: 30.5 pg (ref 26.0–34.0)
MCHC: 34.3 g/dL (ref 30.0–36.0)
MCV: 88.8 fL (ref 78.0–100.0)
Platelets: 160 10*3/uL (ref 150–400)
RBC: 3.94 MIL/uL — ABNORMAL LOW (ref 4.22–5.81)
RDW: 16 % — ABNORMAL HIGH (ref 11.5–15.5)
WBC: 9.4 10*3/uL (ref 4.0–10.5)

## 2016-09-22 LAB — GLUCOSE, CAPILLARY
Glucose-Capillary: 117 mg/dL — ABNORMAL HIGH (ref 65–99)
Glucose-Capillary: 220 mg/dL — ABNORMAL HIGH (ref 65–99)
Glucose-Capillary: 177 mg/dL — ABNORMAL HIGH (ref 65–99)
Glucose-Capillary: 212 mg/dL — ABNORMAL HIGH (ref 65–99)

## 2016-09-22 MED ORDER — OXYCODONE HCL 5 MG PO TABS
5.0000 mg | ORAL_TABLET | ORAL | Status: DC | PRN
  Administered 2016-09-23: 5 mg via ORAL
  Administered 2016-09-24 – 2016-09-25 (×4): 10 mg via ORAL
  Filled 2016-09-22 (×5): qty 2

## 2016-09-22 MED ORDER — NAPROXEN SODIUM 275 MG PO TABS
440.0000 mg | ORAL_TABLET | Freq: Two times a day (BID) | ORAL | Status: DC | PRN
  Filled 2016-09-22: qty 2

## 2016-09-22 NOTE — Anesthesia Post-op Follow-up Note (Signed)
  Anesthesia Pain Follow-up Note  Patient: Colton Bush  Day #: 3  Date of Follow-up: 09/22/2016 Time: 12:25 PM  Last Vitals:  Vitals:   09/22/16 1154 09/22/16 1156  BP:  128/77  Pulse:  96  Resp: 13 19  Temp:  37.1 C    Level of Consciousness: alert  Pain: 1 /10   Side Effects:None  Catheter Site Exam:clean, dry  Epidural / Intrathecal    Start     Dose/Rate Route Frequency Ordered Stop   09/19/16 0845  ropivacaine (PF) 2 mg/mL (0.2%) (NAROPIN) injection     2 mL/hr 2 mL/hr  Epidural Continuous 09/19/16 0835         Plan: Catheter removed/tip intact at surgeon's request, D/C Infusion at surgeon's request and D/C from anesthesia care at surgeon's request  [place].

## 2016-09-22 NOTE — Telephone Encounter (Signed)
Pt left message to return call & states that Dr Barry Dienes was unable to remove the tumor on his pancreas & recommended more chemo.  Discussed with Dr [name] & she knew this & has sent LOS to r/s appts.  Informed pt that he should hear from scheduler soon.

## 2016-09-22 NOTE — Progress Notes (Signed)
Ropivacaine 150 ml wasted in sink with Austin Miles, Rn.

## 2016-09-22 NOTE — Progress Notes (Signed)
Patient transferred to 6n24 in bed by RN and NT.

## 2016-09-22 NOTE — Discharge Instructions (Signed)
[facility]      [facility] °[phone] ° °ABDOMINAL SURGERY: POST OP INSTRUCTIONS ° °Always review your discharge instruction sheet given to you by the facility where your surgery was performed. ° °IF YOU HAVE DISABILITY OR FAMILY LEAVE FORMS, YOU MUST BRING THEM TO THE OFFICE FOR PROCESSING.  PLEASE DO NOT GIVE THEM TO YOUR DOCTOR. ° °1. A prescription for pain medication may be given to you upon discharge.  Take your pain medication as prescribed, if needed.  If narcotic pain medicine is not needed, then you may take acetaminophen (Tylenol) or ibuprofen (Advil) as needed. °2. Take your usually prescribed medications unless otherwise directed. °3. If you need a refill on your pain medication, please contact your pharmacy. They will contact our office to request authorization.  Prescriptions will not be filled after 5pm or on week-ends. °4. You should follow a light diet the first few days after arrival home, such as soup and crackers, pudding, etc.unless your doctor has advised otherwise. A high-fiber, low fat diet can be resumed as tolerated.   Be sure to include lots of fluids daily. Most patients will experience some swelling and bruising on the chest and neck area.  Ice packs will help.  Swelling and bruising can take several days to resolve °5. Most patients will experience some swelling and bruising in the area of the incision. Ice pack will help. Swelling and bruising can take several days to resolve..  °6. It is common to experience some constipation if taking pain medication after surgery.  Increasing fluid intake and taking a stool softener will usually help or prevent this problem from occurring.  A mild laxative (Milk of Magnesia or Miralax) should be taken according to package directions if there are no bowel movements after 48 hours. °7.  You may have steri-strips (small skin tapes) in place directly over the incision.  These strips should be left on the skin for 10-14 days.  If your  surgeon used skin glue on the incision, you may shower in 48 hours.  The glue will flake off over the next 2-3 weeks.  Any sutures or staples will be removed at the office during your follow-up visit. You may find that a light gauze bandage over your incision may keep your staples from being rubbed or pulled. You may shower and replace the bandage daily. °8. ACTIVITIES:  You may resume regular (light) daily activities beginning the next day--such as daily self-care, walking, climbing stairs--gradually increasing activities as tolerated.  You may have sexual intercourse when it is comfortable.  Refrain from any heavy lifting or straining until approved by your doctor. °a. You may drive when you no longer are taking prescription pain medication, you can comfortably wear a seatbelt, and you can safely maneuver your car and apply brakes °b. Return to Work: __________8 weeks if applicable_________________________ °9. You should see your doctor in the office for a follow-up appointment approximately two weeks after your surgery.  Make sure that you call for this appointment within a day or two after you arrive home to insure a convenient appointment time. °OTHER INSTRUCTIONS:  °_____________________________________________________________ °_____________________________________________________________ ° °WHEN TO CALL YOUR DOCTOR: °1. Fever over 101.0 °2. Inability to urinate °3. Nausea and/or vomiting °4. Extreme swelling or bruising °5. Continued bleeding from incision. °6. Increased pain, redness, or drainage from the incision. °7. Difficulty swallowing or breathing °8. Muscle cramping or spasms. °9. Numbness or tingling in hands or feet or around lips. ° °The clinic staff is   available to answer your questions during regular business hours.  Please don’t hesitate to call and ask to speak to one of the nurses if you have concerns. ° °For further questions, please visit [email or web address] ° ° ° °

## 2016-09-22 NOTE — Progress Notes (Signed)
3 Days Post-Op  Subjective: Pain ok.  No n/v.  Objective: Vital signs in last 24 hours: Temp:  [98.5 F (36.9 C)-100.7 F (38.2 C)] 99 F (37.2 C) (02/23 0737) Pulse Rate:  [84-101] 92 (02/23 0737) Resp:  [12-22] 16 (02/23 0755) BP: (120-184)/(64-83) 144/77 (02/23 0737) SpO2:  [94 %-97 %] 96 % (02/23 0755) Last BM Date: 09/17/16  Intake/Output from previous day: 02/22 0701 - 02/23 0700 In: 480 [P.O.:480] Out: 3650 [Urine:3650] Intake/Output this shift: No intake/output data recorded.  General appearance: alert, cooperative and mild distress Resp: breathing comfortably Cardio: regular rate and rhythm GI: soft, non distended, approp tender.   Lab Results:   Recent Labs  09/21/16 0301 09/22/16 0621  WBC 9.9 9.4  HGB 11.6* 12.0*  HCT 34.8* 35.0*  PLT 169 160   BMET  Recent Labs  09/21/16 0301 09/22/16 0621  NA 136 136  K 3.4* 3.5  CL 102 100*  CO2 25 28  GLUCOSE 154* 140*  BUN 5* <5*  CREATININE 0.62 0.60*  CALCIUM 8.6* 8.7*   PT/INR  Recent Labs  09/20/16 0241  LABPROT 16.1*  INR 1.28   ABG No results for input(s): PHART, HCO3 in the last 72 hours.  Invalid input(s): PCO2, PO2  Studies/Results: No results found.  Anti-infectives: Anti-infectives    Start     Dose/Rate Route Frequency Ordered Stop   09/19/16 1400  ceFAZolin (ANCEF) IVPB 2g/100 mL premix     2 g 200 mL/hr over 30 Minutes Intravenous Every 8 hours 09/19/16 1045 09/19/16 2159   09/19/16 0545  ceFAZolin (ANCEF) 2-4 GM/100ML-% IVPB    Comments:  Rosenberger, Meredit: cabinet override      09/19/16 0545 09/19/16 0759   09/19/16 0543  ceFAZolin (ANCEF) IVPB 2g/100 mL premix     2 g 200 mL/hr over 30 Minutes Intravenous On call to O.R. 09/19/16 0543 09/19/16 0759      Assessment/Plan: s/p Procedure(s): DIAGNOSTIC LAPAROSCOPY, LAPAROSCOPIC LIVER BIOPSY, RETROPERITONEAL EXPLORATION, INTRAOPERATIVE ULTRASOUND (N/A)   Full liquids today. Advance to reg diet  tomorrow. Ambulate. Anticipate d/c epidural today and foley Transfer to floor.      LOS: 3 days    [facility] 09/22/2016

## 2016-09-23 LAB — CBC
HCT: 34.6 % — ABNORMAL LOW (ref 39.0–52.0)
Hemoglobin: 11.3 g/dL — ABNORMAL LOW (ref 13.0–17.0)
MCH: 28.9 pg (ref 26.0–34.0)
MCHC: 32.7 g/dL (ref 30.0–36.0)
MCV: 88.5 fL (ref 78.0–100.0)
Platelets: 172 10*3/uL (ref 150–400)
RBC: 3.91 MIL/uL — ABNORMAL LOW (ref 4.22–5.81)
RDW: 16.2 % — ABNORMAL HIGH (ref 11.5–15.5)
WBC: 7.9 10*3/uL (ref 4.0–10.5)

## 2016-09-23 LAB — COMPREHENSIVE METABOLIC PANEL
ALT: 23 U/L (ref 17–63)
AST: 19 U/L (ref 15–41)
Albumin: 2.7 g/dL — ABNORMAL LOW (ref 3.5–5.0)
Alkaline Phosphatase: 160 U/L — ABNORMAL HIGH (ref 38–126)
Anion gap: 8 (ref 5–15)
BUN: 6 mg/dL (ref 6–20)
CO2: 29 mmol/L (ref 22–32)
Calcium: 8.8 mg/dL — ABNORMAL LOW (ref 8.9–10.3)
Chloride: 99 mmol/L — ABNORMAL LOW (ref 101–111)
Creatinine, Ser: 0.59 mg/dL — ABNORMAL LOW (ref 0.61–1.24)
GFR calc Af Amer: >60 mL/min (ref >60)
GFR calc non Af Amer: >60 mL/min (ref >60)
Glucose, Bld: 175 mg/dL — ABNORMAL HIGH (ref 65–99)
Potassium: 3.3 mmol/L — ABNORMAL LOW (ref 3.5–5.1)
Sodium: 136 mmol/L (ref 135–145)
Total Bilirubin: 0.8 mg/dL (ref 0.3–1.2)
Total Protein: 6.6 g/dL (ref 6.5–8.1)

## 2016-09-23 LAB — GLUCOSE, CAPILLARY
Glucose-Capillary: 174 mg/dL — ABNORMAL HIGH (ref 65–99)
Glucose-Capillary: 247 mg/dL — ABNORMAL HIGH (ref 65–99)
Glucose-Capillary: 228 mg/dL — ABNORMAL HIGH (ref 65–99)
Glucose-Capillary: 186 mg/dL — ABNORMAL HIGH (ref 65–99)

## 2016-09-23 NOTE — Progress Notes (Signed)
Pt said since I was transferred to this floor I'm just lying down, I assisted him sitting at the edge of the bed and walker, I told him to wait for Nurse tech to help you to walk while waiting call light within reach, call us if you need help , I called the Nurse tech on duty that the pt is waiting for him.

## 2016-09-23 NOTE — Progress Notes (Signed)
4 Days Post-Op  Subjective: Having some difficulty voiding.  Had to be cathed once.  Has voided since  Objective: Vital signs in last 24 hours: Temp:  [98.2 F (36.8 C)-101.4 F (38.6 C)] 98.2 F (36.8 C) (02/24 0938) Pulse Rate:  [80-96] 86 (02/24 0938) Resp:  [13-19] 18 (02/24 0938) BP: (128-140)/(66-77) 136/71 (02/24 0938) SpO2:  [92 %-98 %] 96 % (02/24 0938) Weight:  [90.9 kg (200 lb 6.4 oz)] 90.9 kg (200 lb 6.4 oz) (02/23 1414) Last BM Date: 09/17/16  Intake/Output from previous day: 02/23 0701 - 02/24 0700 In: 540 [P.O.:540] Out: 2150 [Urine:2150] Intake/Output this shift: Total I/O In: -  Out: 400 [Urine:400]  Exam: Appears comfortable Lungs clear Abdomen soft, dressing dry  Lab Results:   Recent Labs  09/22/16 0621 09/23/16 0449  WBC 9.4 7.9  HGB 12.0* 11.3*  HCT 35.0* 34.6*  PLT 160 172   BMET  Recent Labs  09/22/16 0621 09/23/16 0449  NA 136 136  K 3.5 3.3*  CL 100* 99*  CO2 28 29  GLUCOSE 140* 175*  BUN <5* 6  CREATININE 0.60* 0.59*  CALCIUM 8.7* 8.8*   PT/INR No results for input(s): LABPROT, INR in the last 72 hours. ABG No results for input(s): PHART, HCO3 in the last 72 hours.  Invalid input(s): PCO2, PO2  Studies/Results: No results found.  Anti-infectives: Anti-infectives    Start     Dose/Rate Route Frequency Ordered Stop   09/19/16 1400  ceFAZolin (ANCEF) IVPB 2g/100 mL premix     2 g 200 mL/hr over 30 Minutes Intravenous Every 8 hours 09/19/16 1045 09/19/16 2159   09/19/16 0545  ceFAZolin (ANCEF) 2-4 GM/100ML-% IVPB    Comments:  Rosenberger, Meredit: cabinet override      09/19/16 0545 09/19/16 0759   09/19/16 0543  ceFAZolin (ANCEF) IVPB 2g/100 mL premix     2 g 200 mL/hr over 30 Minutes Intravenous On call to O.R. 09/19/16 0543 09/19/16 0759      Assessment/Plan: s/p Procedure(s): DIAGNOSTIC LAPAROSCOPY, LAPAROSCOPIC LIVER BIOPSY, RETROPERITONEAL EXPLORATION, INTRAOPERATIVE ULTRASOUND (N/A)  D/c  telemetry Monitor UOP   LOS: 4 days    Errol Ala A 09/23/2016

## 2016-09-23 NOTE — Plan of Care (Signed)
Problem: Education: Goal: Knowledge of [facility] [place] [facility] Education information/materials will improve Outcome: Progressing POC reviewed with pt.   

## 2016-09-23 NOTE — Progress Notes (Addendum)
Bladder scan done by NT with 990cc; paged Dr. Kieth Brightly and RTC 0210 and order given for I&O cath.

## 2016-09-24 LAB — GLUCOSE, CAPILLARY
Glucose-Capillary: 187 mg/dL — ABNORMAL HIGH (ref 65–99)
Glucose-Capillary: 210 mg/dL — ABNORMAL HIGH (ref 65–99)
Glucose-Capillary: 209 mg/dL — ABNORMAL HIGH (ref 65–99)
Glucose-Capillary: 240 mg/dL — ABNORMAL HIGH (ref 65–99)

## 2016-09-24 LAB — COMPREHENSIVE METABOLIC PANEL
ALT: 22 U/L (ref 17–63)
AST: 20 U/L (ref 15–41)
Albumin: 2.9 g/dL — ABNORMAL LOW (ref 3.5–5.0)
Alkaline Phosphatase: 169 U/L — ABNORMAL HIGH (ref 38–126)
Anion gap: 10 (ref 5–15)
BUN: 10 mg/dL (ref 6–20)
CO2: 26 mmol/L (ref 22–32)
Calcium: 8.8 mg/dL — ABNORMAL LOW (ref 8.9–10.3)
Chloride: 101 mmol/L (ref 101–111)
Creatinine, Ser: 0.46 mg/dL — ABNORMAL LOW (ref 0.61–1.24)
GFR calc Af Amer: >60 mL/min (ref >60)
GFR calc non Af Amer: >60 mL/min (ref >60)
Glucose, Bld: 171 mg/dL — ABNORMAL HIGH (ref 65–99)
Potassium: 3.1 mmol/L — ABNORMAL LOW (ref 3.5–5.1)
Sodium: 137 mmol/L (ref 135–145)
Total Bilirubin: 0.9 mg/dL (ref 0.3–1.2)
Total Protein: 6.9 g/dL (ref 6.5–8.1)

## 2016-09-24 LAB — CBC
HCT: 36.1 % — ABNORMAL LOW (ref 39.0–52.0)
Hemoglobin: 12 g/dL — ABNORMAL LOW (ref 13.0–17.0)
MCH: 29.6 pg (ref 26.0–34.0)
MCHC: 33.2 g/dL (ref 30.0–36.0)
MCV: 88.9 fL (ref 78.0–100.0)
Platelets: 203 10*3/uL (ref 150–400)
RBC: 4.06 MIL/uL — ABNORMAL LOW (ref 4.22–5.81)
RDW: 16 % — ABNORMAL HIGH (ref 11.5–15.5)
WBC: 6.9 10*3/uL (ref 4.0–10.5)

## 2016-09-24 MED ORDER — POTASSIUM CHLORIDE CRYS ER 20 MEQ PO TBCR: 20.0000 meq | EXTENDED_RELEASE_TABLET | Freq: Two times a day (BID) | ORAL | Status: DC

## 2016-09-24 MED ORDER — POTASSIUM CHLORIDE CRYS ER 20 MEQ PO TBCR
20.0000 meq | EXTENDED_RELEASE_TABLET | Freq: Two times a day (BID) | ORAL | Status: DC
  Administered 2016-09-24 – 2016-09-25 (×2): 20 meq via ORAL
  Filled 2016-09-24 (×2): qty 1

## 2016-09-24 NOTE — Progress Notes (Signed)
Pt able to walked assisted by Nurse tech on duty about 60 feet he came back to bed and he said my legs are too weak, I told him that I will inform the doctor for consult for Physical therapy, no complain of any pain at this time.

## 2016-09-24 NOTE — Evaluation (Signed)
Physical Therapy Evaluation Patient Details Name: Colton Bush MRN: [id number] DOB: 05-Dec-1955 Today's Date: 09/24/2016   History of Present Illness  Pt is a 61 yo male admitted on 09/19/16 for a diagnostic laparoscopy with laparascopic liver biopsy for pancreatic adenocarcinoma cancer. Pt was found to have R inguinal hernia, unresectable pancreatic CA with invasion of portal vein and superior mesenteric artery. Pt was diagnosed with pancreatic cancer in 8/17. Pt was in 4E from Tuesday 09/19/16 through Friday 09/22/16 before being transferred to 6N. PMH significant for C5-C7 SCI, cervical discectomy with fusion 08/18/11, OA, Depression, GERD, DM2, HLD, HTN.    Clinical Impression  Pt presents with the above diagnosis and below deficits. Prior to admission, pt was living with his wife in a single level home and working full time. Pt's wife works and is not available 24 hrs a day to assist. Pt requires Min guard to perform all mobility this session with noted weakness bilateral UE's and LE's. Pt will benefit from continuing to be seen acutely in order to address the below deficits in order to assist with a safe transition home including stair negotiation.     Follow Up Recommendations No PT follow up;Supervision for mobility/OOB    Equipment Recommendations  None recommended by PT    Recommendations for Other Services       Precautions / Restrictions Precautions Precautions: None Restrictions Weight Bearing Restrictions: No      Mobility  Bed Mobility Overal bed mobility: Needs Assistance Bed Mobility: Sit to Supine       Sit to supine: Min guard   General bed mobility comments: Min gaurd to safety to maneuver LE's into bed. Assistance for placing blankets  Transfers Overall transfer level: Needs assistance Equipment used: Rolling walker (2 wheeled) Transfers: Sit to/from Stand Sit to Stand: Min guard         General transfer comment: Min guard to safety from lower chair.  Better stand second attempt  Ambulation/Gait Ambulation/Gait assistance: Min guard Ambulation Distance (Feet): 250 Feet Assistive device: Rolling walker (2 wheeled) Gait Pattern/deviations: Step-through pattern;Decreased step length - right;Decreased step length - left;Narrow base of support;Trunk flexed Gait velocity: decreased Gait velocity interpretation: <1.8 ft/sec, indicative of risk for recurrent falls General Gait Details: Pt with slow cadence and forward trunk lean. Pt looking down throughout gait. very narrow BOS.  Stairs            Wheelchair Mobility    Modified Rankin (Stroke Patients Only)       Balance Overall balance assessment: Needs assistance Sitting-balance support: No upper extremity supported;Feet supported Sitting balance-[name]: Good     Standing balance support: Bilateral upper extremity supported Standing balance-[name]: Poor Standing balance comment: Reliant on RW for stability during gait                             Pertinent Vitals/Pain Pain Assessment: Faces Faces Pain Scale: Hurts little more Pain Location: abdomen Pain Descriptors / Indicators: Grimacing;Guarding Pain Intervention(s): Monitored during session;Patient requesting pain meds-RN notified    Home Living Family/patient expects to be discharged to:: Private residence Living Arrangements: Spouse/significant other Available Help at Discharge: Family Type of Home: House Home Access: Stairs to enter Entrance Stairs-Rails: None Entrance Stairs-Number of Steps: 3 Home Layout: One level Home Equipment: None Additional Comments: wife works    Prior Function Level of Independence: Independent         Comments: wants to work  Hand Dominance   Dominant Hand: Right    Extremity/Trunk Assessment   Upper Extremity Assessment Upper Extremity Assessment: Generalized weakness    Lower Extremity Assessment Lower Extremity Assessment: Generalized  weakness    Cervical / Trunk Assessment Cervical / Trunk Assessment: Kyphotic  Communication   Communication: No difficulties  Cognition Arousal/Alertness: Awake/alert Behavior During Therapy: WFL for tasks assessed/performed;Agitated Overall Cognitive Status: Within Functional Limits for tasks assessed                 General Comments: pt complaining about not being able to walk more frequently, unhappy with stay.     General Comments      Exercises     Assessment/Plan    PT Assessment Patient needs continued PT services  PT Problem List Decreased strength;Decreased activity tolerance;Decreased balance;Decreased mobility;Pain       PT Treatment Interventions DME instruction;Gait training;Stair training;Functional mobility training;Therapeutic activities;Therapeutic exercise;Balance training;Patient/family education    PT Goals (Current goals can be found in the Care Plan section)  Acute Rehab PT Goals Patient Stated Goal: to get home and get stronger PT Goal Formulation: With patient Time For Goal Achievement: 10/01/16 Potential to Achieve Goals: Good    Frequency Min 4X/week   Barriers to discharge        Co-evaluation               End of Session Equipment Utilized During Treatment: Gait belt Activity Tolerance: Patient tolerated treatment well;Patient limited by fatigue Patient left: in bed;with call bell/phone within reach;with family/visitor present;with SCD's reapplied Nurse Communication: Patient requests pain meds PT Visit Diagnosis: Muscle weakness (generalized) (M62.81);Difficulty in walking, not elsewhere classified (R26.2)         Time: 1520-1620 PT Time Calculation (min) (ACUTE ONLY): 60 min   Charges:   PT Evaluation $PT Eval Moderate Complexity: 1 Procedure PT Treatments $Gait Training: 23-37 mins $Therapeutic Activity: 8-22 mins   PT G Codes:         Scheryl Marten PT, DPT  [phone]  09/24/2016, 4:45 PM

## 2016-09-24 NOTE — Progress Notes (Signed)
[facility] Progress Note  5 Days Post-Op  Subjective: Pt is upset with nursing. I spoke to charge nurse about his concerns. He is tolerating his diet and having BM's. He is urinating. His abdominal pain is mild but worse with movement and coughing. He would like to work with PT cause he states he feels his legs are weak. He denies pain in his legs.  Objective: Vital signs in last 24 hours: Temp:  [98 F (36.7 C)-99.5 F (37.5 C)] 98 F (36.7 C) (02/25 0653) Pulse Rate:  [77-96] 77 (02/25 0653) Resp:  [17-18] 17 (02/25 0653) BP: (125-141)/(64-91) 125/64 (02/25 0653) SpO2:  [95 %-98 %] 95 % (02/25 0653) Last BM Date: 09/17/16  Intake/Output from previous day: 02/24 0701 - 02/25 0700 In: 240 [P.O.:240] Out: 650 [Urine:650] Intake/Output this shift: Total I/O In: 222 [P.O.:222] Out: -   PE: Gen:  Alert, NAD, cooperative, well appearing Card:  RRR, no M/G/R heard  Pulm:  CTA, no W/R/R, effort normal Abd: Soft, nondistended, +BS, incisions C/D/I, mild generalized TTP worse around incision. Skin: not diaphoretic, warm and dry  Lab Results:   Recent Labs  09/23/16 0449 09/24/16 0608  WBC 7.9 6.9  HGB 11.3* 12.0*  HCT 34.6* 36.1*  PLT 172 203   BMET  Recent Labs  09/23/16 0449 09/24/16 0608  NA 136 137  K 3.3* 3.1*  CL 99* 101  CO2 29 26  GLUCOSE 175* 171*  BUN 6 10  CREATININE 0.59* 0.46*  CALCIUM 8.8* 8.8*   PT/INR No results for input(s): LABPROT, INR in the last 72 hours. CMP     Component Value Date/Time   NA 137 09/24/2016 0608   NA 141 08/28/2016 0748   K 3.1 (L) 09/24/2016 0608   K 3.8 08/28/2016 0748   CL 101 09/24/2016 0608   CO2 26 09/24/2016 0608   CO2 24 08/28/2016 0748   GLUCOSE 171 (H) 09/24/2016 0608   GLUCOSE 90 08/28/2016 0748   BUN 10 09/24/2016 0608   BUN 10.6 08/28/2016 0748   CREATININE 0.46 (L) 09/24/2016 0608   CREATININE 0.7 08/28/2016 0748   CALCIUM 8.8 (L) 09/24/2016 0608   CALCIUM 9.4 08/28/2016 0748   PROT 6.9 09/24/2016 0608   PROT 7.3 08/28/2016 0748   ALBUMIN 2.9 (L) 09/24/2016 0608   ALBUMIN 3.8 08/28/2016 0748   AST 20 09/24/2016 0608   AST 34 08/28/2016 0748   ALT 22 09/24/2016 0608   ALT 41 08/28/2016 0748   ALKPHOS 169 (H) 09/24/2016 0608   ALKPHOS 222 (H) 08/28/2016 0748   BILITOT 0.9 09/24/2016 0608   BILITOT 0.40 08/28/2016 0748   GFRNONAA >60 09/24/2016 0608   GFRAA >60 09/24/2016 0608   Lipase     Component Value Date/Time   LIPASE 107 (H) 02/27/2016 1647       Studies/Results: No results found.  Anti-infectives: Anti-infectives    Start     Dose/Rate Route Frequency Ordered Stop   09/19/16 1400  ceFAZolin (ANCEF) IVPB 2g/100 mL premix     2 g 200 mL/hr over 30 Minutes Intravenous Every 8 hours 09/19/16 1045 09/19/16 2159   09/19/16 0545  ceFAZolin (ANCEF) 2-4 GM/100ML-% IVPB    Comments:  Rosenberger, Meredit: cabinet override      09/19/16 0545 09/19/16 0759   09/19/16 0543  ceFAZolin (ANCEF) IVPB 2g/100 mL premix     2 g 200 mL/hr over 30 Minutes Intravenous On call to O.R. 09/19/16 [id number] 09/19/16 [id number]  Assessment/Plan s/p Procedure(s): DIAGNOSTIC LAPAROSCOPY, LAPAROSCOPIC LIVER BIOPSY, RETROPERITONEAL EXPLORATION, INTRAOPERATIVE ULTRASOUND (N/A)  Monitor UOP Reg diet PT consult pending  Hypokalemia - Potassium 84mEq PO BID Tolerating diet can likely be discharged tomorrow pending PT eval   LOS: 5 days    Kalman Drape , [facility] 09/24/2016, 10:17 AM Pager: [phone] Consults: [phone] Mon-Fri 7:00 am-4:30 pm Sat-Sun 7:00 am-11:30 am

## 2016-09-25 LAB — CBC
HCT: 34.6 % — ABNORMAL LOW (ref 39.0–52.0)
Hemoglobin: 11.5 g/dL — ABNORMAL LOW (ref 13.0–17.0)
MCH: 29.6 pg (ref 26.0–34.0)
MCHC: 33.2 g/dL (ref 30.0–36.0)
MCV: 88.9 fL (ref 78.0–100.0)
Platelets: 192 10*3/uL (ref 150–400)
RBC: 3.89 MIL/uL — ABNORMAL LOW (ref 4.22–5.81)
RDW: 16 % — ABNORMAL HIGH (ref 11.5–15.5)
WBC: 7 10*3/uL (ref 4.0–10.5)

## 2016-09-25 LAB — BASIC METABOLIC PANEL
Anion gap: 8 (ref 5–15)
BUN: 14 mg/dL (ref 6–20)
CO2: 28 mmol/L (ref 22–32)
Calcium: 8.7 mg/dL — ABNORMAL LOW (ref 8.9–10.3)
Chloride: 102 mmol/L (ref 101–111)
Creatinine, Ser: 0.67 mg/dL (ref 0.61–1.24)
GFR calc Af Amer: >60 mL/min (ref >60)
GFR calc non Af Amer: >60 mL/min (ref >60)
Glucose, Bld: 149 mg/dL — ABNORMAL HIGH (ref 65–99)
Potassium: 3.6 mmol/L (ref 3.5–5.1)
Sodium: 138 mmol/L (ref 135–145)

## 2016-09-25 LAB — GLUCOSE, CAPILLARY
Glucose-Capillary: 118 mg/dL — ABNORMAL HIGH (ref 65–99)
Glucose-Capillary: 191 mg/dL — ABNORMAL HIGH (ref 65–99)
Glucose-Capillary: 208 mg/dL — ABNORMAL HIGH (ref 65–99)

## 2016-09-25 MED ORDER — OXYCODONE HCL 5 MG PO TABS: 5.0000 mg | ORAL_TABLET | ORAL | 0 refills | Status: DC | PRN

## 2016-09-25 MED ORDER — INSULIN DETEMIR 100 UNIT/ML FLEXPEN: 15.0000 [IU] | PEN_INJECTOR | Freq: Two times a day (BID) | SUBCUTANEOUS | 3 refills | Status: DC

## 2016-09-25 NOTE — Progress Notes (Signed)
Colton Bush to be D/C'd  per MD order. Discussed with the patient and all questions fully answered.  VSS, Skin clean, dry and intact without evidence of skin break down, no evidence of skin tears noted.  IV catheter discontinued intact. Site without signs and symptoms of complications. Dressing and pressure applied.  An After Visit Summary was printed and given to the patient. Patient received prescription.  D/c education completed with patient/family including follow up instructions, medication list, d/c activities limitations if indicated, with other d/c instructions as indicated by MD - patient able to verbalize understanding, all questions fully answered.   Patient instructed to return to ED, call 911, or call MD for any changes in condition.   Patient to be escorted via [place], and D/C home via private auto.

## 2016-09-25 NOTE — Progress Notes (Signed)
Physical Therapy Treatment Patient Details Name: Colton Bush MRN: [id number] DOB: 1955/08/14 Today's Date: 09/25/2016    History of Present Illness Pt is a 61 yo male admitted on 09/19/16 for a diagnostic laparoscopy with laparascopic liver biopsy for pancreatic adenocarcinoma cancer. Pt was found to have R inguinal hernia, unresectable pancreatic CA with invasion of portal vein and superior mesenteric artery. Pt was diagnosed with pancreatic cancer in 8/17. PMH significant for C5-C7 SCI, cervical discectomy with fusion 08/18/11, OA, Depression, GERD, DM2, HLD, HTN.      PT Comments    Patient is progressing with mobility and reported feeling much better today than the last couple of days. Pt required min A for stair negotiation and continues to demonstrate generalized weakness. PT will continue to follow acutely.    Follow Up Recommendations  No PT follow up;Supervision for mobility/OOB     Equipment Recommendations  None recommended by PT    Recommendations for Other Services       Precautions / Restrictions Precautions Precautions: None Restrictions Weight Bearing Restrictions: No    Mobility  Bed Mobility Overal bed mobility: Modified Independent Bed Mobility: Supine to Sit           General bed mobility comments: increased time/effort  Transfers Overall transfer level: Needs assistance Equipment used: Rolling walker (2 wheeled) Transfers: Sit to/from Stand Sit to Stand: Min guard;Supervision         General transfer comment: min guard from EOB and supervision from recliner and commode; cues for hand placement  Ambulation/Gait Ambulation/Gait assistance: Supervision Ambulation Distance (Feet): 280 Feet Assistive device: Rolling walker (2 wheeled) Gait Pattern/deviations: Step-through pattern;Narrow base of support;Trunk flexed;Decreased stride length Gait velocity: decreased   General Gait Details: slow, steady gait; cues for posture and forward  gaze   Stairs Stairs: Yes   Stair Management: No rails;Step to pattern;Backwards;With walker;One rail Right Number of Stairs:  (2 steps X2) General stair comments: HHA and one rail first trial and backwards using RW second trial; pt more steady with use of RW; cues for sequencing and technique  Wheelchair Mobility    Modified Rankin (Stroke Patients Only)       Balance Overall balance assessment: Needs assistance Sitting-balance support: No upper extremity supported;Feet supported Sitting balance-[name]: Good     Standing balance support: Bilateral upper extremity supported Standing balance-[name]: Poor Standing balance comment: Reliant on RW for stability during gait                    Cognition Arousal/Alertness: Awake/alert Behavior During Therapy: WFL for tasks assessed/performed;Agitated Overall Cognitive Status: Within Functional Limits for tasks assessed                      Exercises      General Comments        Pertinent Vitals/Pain Pain Assessment: Faces Faces Pain Scale: Hurts little more Pain Location: abdomen mainly with transitional movements Pain Descriptors / Indicators: Grimacing;Guarding Pain Intervention(s): Monitored during session;Premedicated before session;Repositioned    Home Living                      Prior Function            PT Goals (current goals can now be found in the care plan section) Acute Rehab PT Goals Patient Stated Goal: to get home and get stronger PT Goal Formulation: With patient Time For Goal Achievement: 10/01/16 Potential to Achieve Goals: Good Progress  towards PT goals: Progressing toward goals    Frequency    Min 4X/week      PT Plan Current plan remains appropriate    Co-evaluation             End of Session Equipment Utilized During Treatment: Gait belt Activity Tolerance: Patient tolerated treatment well Patient left: with call bell/phone within  reach;in chair Nurse Communication: Mobility status PT Visit Diagnosis: Muscle weakness (generalized) (M62.81);Difficulty in walking, not elsewhere classified (R26.2)     Time: [id number] PT Time Calculation (min) (ACUTE ONLY): 39 min  Charges:  $Gait Training: 23-37 mins $Therapeutic Activity: 8-22 mins                    G Codes:       Salina April, PTA Pager: [phone]   09/25/2016, 2:54 PM

## 2016-09-26 NOTE — Discharge Summary (Signed)
Physician Discharge Summary  Patient ID: Colton Bush MRN: [id number] DOB/AGE: April 09, 1956 61 y.o.  Admit date: 09/19/2016 Discharge date: 09/26/2016  Admission Diagnoses: Patient Active Problem List   Diagnosis Date Noted  . Port catheter in place 04/11/2016  . Hypercalcemia 03/29/2016  . Adenocarcinoma of head of pancreas ([place]) 03/17/2016  . Biliary obstruction   . Obstructive jaundice due to malignant neoplasm ([place]) 02/27/2016  . DKA (diabetic ketoacidoses) ([place]) 02/27/2016  . Diabetes mellitus type 2, uncontrolled, without complications ([place]) 99991111  . Cervical spondylosis with myelopathy 08/18/2011  . CERUMEN IMPACTION 12/14/2008  . Hyperlipidemia, mixed 09/16/2007  . Essential hypertension 09/16/2007  . GERD 09/16/2007  . ESOPHAGEAL STRICTURE 04/04/2007  . HIATAL HERNIA 04/04/2007   Discharge Diagnoses:  Active Problems:   Adenocarcinoma of head of pancreas (HCC) unresectable adenocarcinoma of the head of the pancreas.   DM type 2 HTN GERD  Discharged Condition: stable  Hospital Course:  Pt was admitted to stepdown after retroperitoneal exploration.  Mass found to invade SMA and SMV.  He had an epidural and PCA that worked well.  NGT was pulled out on POD 1.  He tolerated ice chips on POD1.  He was slowly able to have his diet advanced.  Once he got to full liquids he was started on oxycodone.  His epidural was pulled.  He did require I&O catheter once after epidural d/c'd. He was able to be discharged in stable condition.  He had no evidence of wound infection.    Consults: None  Significant Diagnostic Studies: labs: HCT 34.6, Cr 0.67.    Treatments: surgery: see above.    Discharge Exam: Blood pressure 121/65, pulse 74, temperature 97.9 F (36.6 C), temperature source Oral, resp. rate 16, height 5\' 10"  (1.778 m), weight 90.9 kg (200 lb 6.4 oz), SpO2 96 %. General appearance: alert, cooperative and no distress Resp: breathing comfortably GI: soft, non  distended.  approp tender.    Disposition: 01-Home or Self Care  Discharge Instructions    Call MD for:  persistant nausea and vomiting    Complete by:  As directed    Call MD for:  redness, tenderness, or signs of infection (pain, swelling, redness, odor or green/yellow discharge around incision site)    Complete by:  As directed    Call MD for:  severe uncontrolled pain    Complete by:  As directed    Call MD for:  temperature >100.4    Complete by:  As directed    Diet - low sodium heart healthy    Complete by:  As directed    Driving Restrictions    Complete by:  As directed    No driving before 3/5.   Increase activity slowly    Complete by:  As directed    Lifting restrictions    Complete by:  As directed    No lifting over 25 pounds until 3/20.     Allergies as of 09/25/2016      Reactions   No Known Allergies       Medication List    TAKE these medications   amLODipine 10 MG tablet Commonly known as:  NORVASC TAKE 1 TABLET BY MOUTH EVERY DAY   benazepril 10 MG tablet Commonly known as:  LOTENSIN TAKE 1 TABLET (10 MG TOTAL) BY MOUTH DAILY.   capecitabine 500 MG tablet Commonly known as:  XELODA Take 4 tab in the morning and 3 tab in the evening, on the day of radiation only (  Monday through Friday)   Fish Oil 1200 MG Caps Take 2,400 mg by mouth daily.   freestyle lancets USE AS INSTRUCTED TO CHECK BLOOD SUGAR ONCE A DAY   FREESTYLE LITE test strip Generic drug:  glucose blood USE ONE STRIP TO CHECK GLUCOSE ONCE DAILY. PLEASE  SCHEDULE FOLLOW UP   insulin aspart 100 UNIT/ML injection Commonly known as:  novoLOG Give every morning with breakfast and every evening with supper per the following sliding scale: give 3 units for glucose 150-175, give 6 units for glucose 176-200, give 9 units for glucose 201-250, give 12 units for glucose greater than 250   Insulin Detemir 100 UNIT/ML Pen Commonly known as:  LEVEMIR Inject 15 Units into the skin 2 (two)  times daily. What changed:  how much to take   Insulin Pen Needle 30G X 5 MM Misc Use one daily with insulin   JARDIANCE 25 MG Tabs tablet Generic drug:  empagliflozin TAKE 1 TABLET BY MOUTH EVERY DAY   lidocaine-prilocaine cream Commonly known as:  EMLA Apply to affected area once What changed:  how much to take  how to take this  when to take this  reasons to take this  additional instructions   loratadine 10 MG tablet Commonly known as:  CLARITIN Take 10 mg by mouth daily.   multivitamin with minerals Tabs tablet Take 1 tablet by mouth daily.   naproxen sodium 220 MG tablet Commonly known as:  ANAPROX Take 440 mg by mouth 2 (two) times daily as needed (for pain.).   omega-3 acid ethyl esters 1 g capsule Commonly known as:  LOVAZA Take 2 g by mouth daily.   omeprazole 20 MG capsule Commonly known as:  PRILOSEC TAKE ONE CAPSULE BY MOUTH ONCE DAILY   oxyCODONE 5 MG immediate release tablet Commonly known as:  Oxy IR/ROXICODONE Take 1-2 tablets (5-10 mg total) by mouth every 4 (four) hours as needed for moderate pain, severe pain or breakthrough pain.   potassium chloride SA 20 MEQ tablet Commonly known as:  KLOR-CON M20 Take 1 tablet (20 mEq total) by mouth daily.      Follow-up Information    Alika Saladin, MD Follow up in 10 day(s).   Specialty:  General Surgery Why:  approximately Contact information: [place] [address] [phone]           Signed: Stark Klein 09/26/2016, 3:45 PM

## 2016-09-28 NOTE — Progress Notes (Signed)
[place]  Telephone:([phone]) [phone] Fax:(336) [phone]  Clinic Follow up Note   Patient Care Team: [name], MD as PCP - General 09/29/2016  CHIEF COMPLAINTS:  Follow up pancreatic cancer  Oncology History   Adenocarcinoma of head of pancreas [facility])   Staging form: Pancreas, AJCC 7th Edition   - Clinical stage from 03/09/2016: Stage IIB (T2, N1, M0) - Signed by Truitt Merle, MD on 03/17/2016      Adenocarcinoma of head of pancreas ([place])   02/27/2016 Imaging    CT chest, abdomen and pelvis with contrast showed ill-defined heterogeneity of pancreatic head, highly suspicious for malignancy, ) quit about and biliary ductal dilatation, mildly prominent lymph nodes in the upper abdomen, largest 2 cm in the portocaval . No other metastasis.       03/09/2016 Initial Diagnosis    Adenocarcinoma of head of pancreas ([place])      03/09/2016 Procedure    Upper EUS showed a 3.5 cm irregular mass in the pancreatic head, causing pancreatic and biliary duct obstruction. The mass involves the portal vein 422 mm, strongly suggesting invasion. There is suspicious nearby adenopathy      03/09/2016 Initial Biopsy    Fine-needle aspiration of the pancreatic mass from EUS showed malignant cells consistent with adenocarcinoma.       03/28/2016 - 05/24/2016 Chemotherapy    neoadjuvant chemo with FOLFIRINOX every 2 weeks      06/28/2016 - 08/10/2016 Radiation Therapy     neoadjuvant radiation      06/28/2016 - 08/10/2016 Chemotherapy    Xeloda 2000 mg in the morning and 1500 mg in the evening, on the day of radiation.      09/04/2016 Imaging    CT Chest Abdomen Pelvis IMPRESSION: 1. Poorly defined pancreatic head mass is grossly stable, with associated pancreatic ductal dilatation and portacaval adenopathy. No associated vascular encasement. 2. Common bile duct stent in place with stable biliary ductal dilatation. Left hepatic lobe atrophy. 3. Hepatomegaly.  Spleen is at the upper  limits normal in size. 4. Aortic atherosclerosis (ICD10-170.0). Coronary artery calcification.      09/19/2016 Surgery    Diagnostic laparoscopy and possible whipple procedure by Dr. Barry Dienes. Whipple procedure aborted due to the pancreatic cancer involving SMV, SMA and portal veins.      09/19/2016 Pathology Results    Two liver lesions were noted during diagnostic laparoscopy and biopsy revealed benign hepatic parenchyma with assoicated fibrosis with no evidence of malignancy.       HISTORY OF PRESENTING ILLNESS:  Colton Bush 61 y.o. male is here because of His newly diagnosed pancreatic cancer. He is accompanied by his wife to our multidisciplinary GI clinic today.  He noticed fatigue, anorexia and weight loss about 50bs in the past 4 months. It started when his endocrinologist changed his diabetic meds. He presented to hospital on 7/30 with jaundice. No pain or nausea. CT scan revealed a heterogeneous pancreatic head mass, highly suspicious for malignancy. Mild biliary and pancreatic duct dilatation, mildly prominent lymph nodes in the upper abdomen. He underwent ERCP and CBD stent placement, and subsequent upper EUS on 03/09/2016, which showed a 3.5 cm irregular mass in the pancreatic head, the mass involves the portal vein, no suspicious nearby adenopathy.  He has been feeling better after stent placement, with improved appetite and energy level. He feels normal again. No symptoms. BM is good, he has gained about 8lbs back.   CURRENT THERAPY: pending chemo Gemcitabine and Abraxane weekly, on  day 1, 8, and 15 every 28 days  INTERIM HISTORY: Colton Bush returns for follow up. He was brought to or by Dr. Barry Dienes for Whipple surgery on 09/19/2016, unfortunately the pancreatic tumor was found to involve SMA, SMV and portal vein extensively, and her surgery was aborted. He was discharged home on February 26. He has been recovering well, the incision pain has much improved, he does not take much  pain medication. The stitches were removed by Dr. Barry Dienes today. His appetite and energy all has recovered well. He denies any other new symptoms.  MEDICAL HISTORY:  Past Medical History:  Diagnosis Date  . Arthritis    left hand  . Bronchitis 1977  . Cancer ([place]) 03/09/2016   pancreatic cancer  . Depression    takes Cymbalta daily  . Diabetes mellitus type II    sees [name]   . GERD (gastroesophageal reflux disease)    takes Omeprazole daily  . H/O hiatal hernia   . Hyperlipidemia    takes Zocor daily  . Hypertension    takes Amlodipine daily  . Neck pain    C4-7 stenosis and herniated disc  . Neuromuscular disorder ([place])    hiatal hernia  . Scoliosis    slight  . Spinal cord injury, C5-C7 ([place])    c4-c7  . Stiffness of hand joint    d/t cervical issues    SURGICAL HISTORY: Past Surgical History:  Procedure Laterality Date  . ANTERIOR CERVICAL DECOMP/DISCECTOMY FUSION  08/18/2011   Procedure: ANTERIOR CERVICAL DECOMPRESSION/DISCECTOMY FUSION 3 LEVELS;  Surgeon: Winfield Cunas, MD;  Location: [place] [facility];  Service: Neurosurgery;  Laterality: N/A;  Anterior Cervical Four-Five/Five-Six/Six-Seven Decompression with Fusion, Plating, and Bonegraft  . CARPAL TUNNEL RELEASE  2013   bilateral, per Dr. Christella Noa   . COLONOSCOPY  10-30-14   per Dr. Olevia Perches, clear, repeat in 10 yrs   . egd with esophageal dilation  9-08   per Dr. Olevia Perches  . ERCP N/A 03/01/2016   Procedure: ENDOSCOPIC RETROGRADE CHOLANGIOPANCREATOGRAPHY (ERCP) with brushings and stent;  Surgeon: Doran Stabler, MD;  Location: [facility];  Service: Endoscopy;  Laterality: N/A;  . EUS N/A 03/09/2016   Procedure: ESOPHAGEAL ENDOSCOPIC ULTRASOUND (EUS) RADIAL;  Surgeon: Milus Banister, MD;  Location: [facility];  Service: Endoscopy;  Laterality: N/A;  . lymph nodes biopsy    . melanoma rt calf  1999  . PORTACATH PLACEMENT Left 03/22/2016   Procedure: INSERTION PORT-A-CATH;  Surgeon: Stark Klein, MD;  Location: [facility]  ORS;  Service: General;  Laterality: Left;  . SPINE SURGERY    . TONSILLECTOMY     as a child  . ULNAR TUNNEL RELEASE  2013   right arm, per Dr. Christella Noa   . UPPER GASTROINTESTINAL ENDOSCOPY    . WHIPPLE PROCEDURE N/A 09/19/2016   Procedure: DIAGNOSTIC LAPAROSCOPY, LAPAROSCOPIC LIVER BIOPSY, RETROPERITONEAL EXPLORATION, INTRAOPERATIVE ULTRASOUND;  Surgeon: Stark Klein, MD;  Location: [place] OR;  Service: General;  Laterality: N/A;    SOCIAL HISTORY: Social History   Social History  . Marital status: Married    Spouse name: [name]  . Number of children: N/A  . Years of education: N/A   Occupational History  . Driver    Social History Main Topics  . Smoking status: Never Smoker  . Smokeless tobacco: Never Used     Comment: tried a pipe 35 years ago   . Alcohol use No  . Drug use: No  . Sexual activity: Yes  Other Topics Concern  . Not on file   Social History Narrative   Married, wife Rosaria Ferries Nutritional therapist-   He works as a Geophysicist/field seismologist for a company  He has one son 6 yo. Lives with his wife   FAMILY HISTORY: Family History  Problem Relation Age of Onset  . Heart disease Father   . Heart disease Brother 18  . Anesthesia problems Mother   . Heart disease Mother   . Dementia Mother   . Diabetes Sister   . Stroke Sister   . Colon cancer Neg Hx   . Rectal cancer Neg Hx   . Stomach cancer Neg Hx     ALLERGIES:  is allergic to no known allergies.  MEDICATIONS:  Current Outpatient Prescriptions  Medication Sig Dispense Refill  . amLODipine (NORVASC) 10 MG tablet TAKE 1 TABLET BY MOUTH EVERY DAY 30 tablet 6  . benazepril (LOTENSIN) 10 MG tablet TAKE 1 TABLET (10 MG TOTAL) BY MOUTH DAILY. 30 tablet 3  . FREESTYLE LITE test strip USE ONE STRIP TO CHECK GLUCOSE ONCE DAILY. PLEASE  SCHEDULE FOLLOW UP 50 each 0  . insulin aspart (NOVOLOG) 100 UNIT/ML injection Give every morning with breakfast and every evening with supper per the following sliding scale: give 3 units for  glucose 150-175, give 6 units for glucose 176-200, give 9 units for glucose 201-250, give 12 units for glucose greater than 250 3 vial 11  . Insulin Detemir (LEVEMIR) 100 UNIT/ML Pen Inject 15 Units into the skin 2 (two) times daily. 90 mL 3  . Insulin Pen Needle 30G X 5 MM MISC Use one daily with insulin 100 each 2  . JARDIANCE 25 MG TABS tablet TAKE 1 TABLET BY MOUTH EVERY DAY 90 tablet 1  . Lancets (FREESTYLE) lancets USE AS INSTRUCTED TO CHECK BLOOD SUGAR ONCE A DAY 100 each 2  . loratadine (CLARITIN) 10 MG tablet Take 10 mg by mouth daily.    . Multiple Vitamin (MULTIVITAMIN WITH MINERALS) TABS tablet Take 1 tablet by mouth daily.    . naproxen sodium (ANAPROX) 220 MG tablet Take 440 mg by mouth 2 (two) times daily as needed (for pain.).    Marland Kitchen omega-3 acid ethyl esters (LOVAZA) 1 g capsule Take 2 g by mouth daily.    . Omega-3 Fatty Acids (FISH OIL) 1200 MG CAPS Take 2,400 mg by mouth daily.    Marland Kitchen omeprazole (PRILOSEC) 20 MG capsule TAKE ONE CAPSULE BY MOUTH ONCE DAILY 90 capsule 3  . oxyCODONE (OXY IR/ROXICODONE) 5 MG immediate release tablet Take 1-2 tablets (5-10 mg total) by mouth every 4 (four) hours as needed for moderate pain, severe pain or breakthrough pain. 40 tablet 0  . potassium chloride SA (KLOR-CON M20) 20 MEQ tablet Take 1 tablet (20 mEq total) by mouth daily. 30 tablet 1  . capecitabine (XELODA) 500 MG tablet Take 4 tab in the morning and 3 tab in the evening, on the day of radiation only (Monday through Friday) (Patient not taking: Reported on 09/13/2016) 210 tablet 0   No current facility-administered medications for this visit.     REVIEW OF SYSTEMS:   Constitutional: Denies fevers, chills or abnormal night sweats Eyes: Denies blurriness of vision, double vision or watery eyes Ears, nose, mouth, throat, and face: Denies mucositis or sore throat Respiratory: Denies cough, dyspnea or wheezes Cardiovascular: Denies palpitation, chest discomfort or lower extremity  swelling Gastrointestinal:  Denies nausea, heartburn or change in bowel habits Skin: Denies  abnormal skin rashes Lymphatics: Denies new lymphadenopathy or easy bruising Neurological:Denies numbness, tingling or new weaknesses Behavioral/Psych: Mood is stable, no new changes  All other systems were reviewed with the patient and are negative.  PHYSICAL EXAMINATION: ECOG PERFORMANCE STATUS: 1  Vitals:   09/29/16 1513  BP: 134/71  Pulse: 82  Resp: 18  Temp: 98.5 F (36.9 C)   Filed Weights   09/29/16 1513  Weight: 189 lb 8 oz (86 kg)    GENERAL:alert, no distress and comfortable.  SKIN: skin color, texture, turgor are normal, no rashes or significant lesions EYES: normal, conjunctiva are pink and non-injected, sclera clear OROPHARYNX:no exudate, no erythema and lips, buccal mucosa, and tongue normal  NECK: supple, thyroid normal size, non-tender, without nodularity LYMPH:  no palpable lymphadenopathy in the cervical, axillary or inguinal LUNGS: clear to auscultation and percussion with normal breathing effort HEART: regular rate & rhythm and no murmurs and no lower extremity edema ABDOMEN:abdomen soft, non-tender and normal bowel sounds Musculoskeletal:no cyanosis of digits and no clubbing  PSYCH: alert & oriented x 3 with fluent speech NEURO: no focal motor/sensory deficits  LABORATORY DATA:  I have reviewed the data as listed CBC Latest Ref Rng & Units 09/25/2016 09/24/2016 09/23/2016  WBC 4.0 - 10.5 K/uL 7.0 6.9 7.9  Hemoglobin 13.0 - 17.0 g/dL 11.5(L) 12.0(L) 11.3(L)  Hematocrit 39.0 - 52.0 % 34.6(L) 36.1(L) 34.6(L)  Platelets 150 - 400 K/uL 192 203 172   CMP Latest Ref Rng & Units 09/25/2016 09/24/2016 09/23/2016  Glucose 65 - 99 mg/dL 149(H) 171(H) 175(H)  BUN 6 - 20 mg/dL 14 10 6   Creatinine 0.61 - 1.24 mg/dL 0.67 0.46(L) 0.59(L)  Sodium 135 - 145 mmol/L 138 137 136  Potassium 3.5 - 5.1 mmol/L 3.6 3.1(L) 3.3(L)  Chloride 101 - 111 mmol/L 102 101 99(L)  CO2 22 - 32  mmol/L 28 26 29   Calcium 8.9 - 10.3 mg/dL 8.7(L) 8.8(L) 8.8(L)  Total Protein 6.5 - 8.1 g/dL - 6.9 6.6  Total Bilirubin 0.3 - 1.2 mg/dL - 0.9 0.8  Alkaline Phos 38 - 126 U/L - 169(H) 160(H)  AST 15 - 41 U/L - 20 19  ALT 17 - 63 U/L - 22 23   CA19.9 (0-35 U/ML) 02/28/2016: 1497 03/24/2016: 648 04/25/2016: 599 05/24/2016: 368 07/18/2016: 51 08/28/16: 55  PATHOLOGY REPORT   Liver, biopsy 09/19/16 - BENIGN HEPATIC PARENCHYMA WITH ASSOCIATED FIBROSIS. - THERE IS NO EVIDENCE OF MALIGNANCY.  Diagnosis 03/09/2016 FINE NEEDLE ASPIRATION, ENDOSCOPIC, PANCREAS (SPECIMEN 1 OF 1 COLLECTED 03/09/16): MALIGNANT CELLS CONSISTENT WITH ADENOCARCINOMA. BACKGROUND NECROTIC DEBRIS AND ACUTE INFLAMMATION.  Diagnosis 03/01/2016 BILE DUCT BRUSHING(SPECIMEN 1 OF 1 COLLECTED 03/01/16): RARE ATYPICAL CELLS, SEE COMMENT.  RADIOGRAPHIC STUDIES: I have personally reviewed the radiological images as listed and agreed with the findings in the report. Ct Abdomen Pelvis W Wo Contrast  Result Date: 09/04/2016 CLINICAL DATA:  Adenocarcinoma head of pancreas with chemotherapy and radiation therapy. EXAM: CT CHEST WITH CONTRAST CT ABDOMEN AND PELVIS WITH AND WITHOUT CONTRAST TECHNIQUE: Multidetector CT imaging of the chest was performed during intravenous contrast administration. Multidetector CT imaging of the abdomen and pelvis was performed following the standard protocol before and during bolus administration of intravenous contrast. CONTRAST:  126mL ISOVUE-300 IOPAMIDOL (ISOVUE-300) INJECTION 61% COMPARISON:  05/16/2016. FINDINGS: CT CHEST FINDINGS Cardiovascular: Left-sided Port-A-Cath terminates in the SVC. Coronary artery calcification. Heart size normal. No pericardial effusion. Mediastinum/Nodes: No pathologically enlarged mediastinal, hilar or axillary lymph nodes. There may be distal esophageal wall thickening which can  be seen with gastroesophageal reflux. Lungs/Pleura: Smudgy 4 mm subpleural anterior segment right  upper lobe nodule is unchanged and likely a subpleural lymph node. Mild scarring in the right middle lobe and lingula. No pleural fluid. Airway is unremarkable. Musculoskeletal: No worrisome lytic or sclerotic lesions. Degenerative changes are seen in the spine. CT ABDOMEN AND PELVIS FINDINGS Hepatobiliary: Liver measures 21.7 cm. Stable intrahepatic and extrahepatic biliary ductal dilatation with a common bile duct stent in place. Left hepatic lobe atrophy. Mild gallbladder wall edema, similar. Pancreas: There is fullness in the pancreatic head and uncinate process without a discrete measurable mass. Overall, the appearance is similar, including marked pancreatic ductal dilatation and slight atrophy of the body and tail. Spleen: Upper limits of normal in size.  Otherwise negative. Adrenals/Urinary Tract: Adrenal glands are unremarkable. Subcentimeter low-attenuation lesions in the kidneys are too small to characterize but cysts are most likely. Ureters are decompressed. Bladder is grossly unremarkable. Stomach/Bowel: Stomach, small bowel, appendix and colon are unremarkable. Vascular/Lymphatic: Atherosclerotic calcification of the arterial vasculature without abdominal aortic aneurysm. No vascular encasement is associated with the pancreatic head mass. All Portacaval lymph node measures 1.8 cm (series 9, image 144), stable. Reproductive: Prostate may be minimally prominent. Other: No free fluid.  Tiny periumbilical hernia contains fat. Musculoskeletal: A densely sclerotic lesion in the left iliac wing is likely a bone island. Degenerative changes are seen in the spine. IMPRESSION: 1. Poorly defined pancreatic head mass is grossly stable, with associated pancreatic ductal dilatation and portacaval adenopathy. No associated vascular encasement. 2. Common bile duct stent in place with stable biliary ductal dilatation. Left hepatic lobe atrophy. 3. Hepatomegaly.  Spleen is at the upper limits normal in size. 4. Aortic  atherosclerosis (ICD10-170.0). Coronary artery calcification. Electronically Signed   By: Lorin Picket M.D.   On: 09/04/2016 14:03   Ct Chest W Contrast  Result Date: 09/04/2016 CLINICAL DATA:  Adenocarcinoma head of pancreas with chemotherapy and radiation therapy. EXAM: CT CHEST WITH CONTRAST CT ABDOMEN AND PELVIS WITH AND WITHOUT CONTRAST TECHNIQUE: Multidetector CT imaging of the chest was performed during intravenous contrast administration. Multidetector CT imaging of the abdomen and pelvis was performed following the standard protocol before and during bolus administration of intravenous contrast. CONTRAST:  152mL ISOVUE-300 IOPAMIDOL (ISOVUE-300) INJECTION 61% COMPARISON:  05/16/2016. FINDINGS: CT CHEST FINDINGS Cardiovascular: Left-sided Port-A-Cath terminates in the SVC. Coronary artery calcification. Heart size normal. No pericardial effusion. Mediastinum/Nodes: No pathologically enlarged mediastinal, hilar or axillary lymph nodes. There may be distal esophageal wall thickening which can be seen with gastroesophageal reflux. Lungs/Pleura: Smudgy 4 mm subpleural anterior segment right upper lobe nodule is unchanged and likely a subpleural lymph node. Mild scarring in the right middle lobe and lingula. No pleural fluid. Airway is unremarkable. Musculoskeletal: No worrisome lytic or sclerotic lesions. Degenerative changes are seen in the spine. CT ABDOMEN AND PELVIS FINDINGS Hepatobiliary: Liver measures 21.7 cm. Stable intrahepatic and extrahepatic biliary ductal dilatation with a common bile duct stent in place. Left hepatic lobe atrophy. Mild gallbladder wall edema, similar. Pancreas: There is fullness in the pancreatic head and uncinate process without a discrete measurable mass. Overall, the appearance is similar, including marked pancreatic ductal dilatation and slight atrophy of the body and tail. Spleen: Upper limits of normal in size.  Otherwise negative. Adrenals/Urinary Tract: Adrenal glands  are unremarkable. Subcentimeter low-attenuation lesions in the kidneys are too small to characterize but cysts are most likely. Ureters are decompressed. Bladder is grossly unremarkable. Stomach/Bowel: Stomach, small bowel, appendix and  colon are unremarkable. Vascular/Lymphatic: Atherosclerotic calcification of the arterial vasculature without abdominal aortic aneurysm. No vascular encasement is associated with the pancreatic head mass. All Portacaval lymph node measures 1.8 cm (series 9, image 144), stable. Reproductive: Prostate may be minimally prominent. Other: No free fluid.  Tiny periumbilical hernia contains fat. Musculoskeletal: A densely sclerotic lesion in the left iliac wing is likely a bone island. Degenerative changes are seen in the spine. IMPRESSION: 1. Poorly defined pancreatic head mass is grossly stable, with associated pancreatic ductal dilatation and portacaval adenopathy. No associated vascular encasement. 2. Common bile duct stent in place with stable biliary ductal dilatation. Left hepatic lobe atrophy. 3. Hepatomegaly.  Spleen is at the upper limits normal in size. 4. Aortic atherosclerosis (ICD10-170.0). Coronary artery calcification. Electronically Signed   By: Lorin Picket M.D.   On: 09/04/2016 14:03   Dg Chest Port 1 View  Result Date: 09/19/2016 CLINICAL DATA:  Central line placement.  Postop appendectomy. EXAM: PORTABLE CHEST 1 VIEW COMPARISON:  CT 09/04/2016.  Chest x-ray 03/22/2016. FINDINGS: PowerPort catheter noted with tip at cavoatrial junction. NG tube noted with tip below left hemidiaphragm. Right IJ line noted with tip over the superior vena cava. Cardiomegaly with normal pulmonary vascularity. Bibasilar subsegmental atelectasis. No prominent pleural effusion or pneumothorax. Free intraperitoneal air noted consistent with prior intra-abdominal surgery. Prior cervical spine fusion. Thoracolumbar spine scoliosis . IMPRESSION: 1. Lines and tubes noted as above. 2. Mild  bibasilar subsegmental atelectasis. 3. Cardiomegaly, no pulmonary venous congestion. 4. Free intraperitoneal air consistent with prior surgery. Electronically Signed   By: Marcello Moores  Register   On: 09/19/2016 11:00   EUS 03/09/2016 Dr. Ardis Hughs  - 3.5cm irregularly shaped, poorly defined mass in the pancreatic head. There is suspicious, nearby adenopathy. The mass is causing pancreatic and bililary duct obstruction (previously stented). The mass involves the portal vein for 16mm (abuttment and loss of usual tissue interface), strongly suggesting invasion. Preliminary cytology results are positive for malignancy (adenocarcinoma), await final report.  ASSESSMENT & PLAN: 61 y.o. Caucasian male, with past medical history of diabetes and hypertension, presented with epigastric pain, weight loss, and jaundice.  1. Primary pancreatic adenocarcinoma, in pancreas head, cT2N1M0, stage IIB, unresectable by exploratory laparoscope.   -I have previously reviewed his CT abdomen and pelvis with contrast, EUS and biopsy findings with patient and his wife in great details. -His case was previously reviewed in our GI tumor board a few days ago. CT scan and US showed possible portal vein invasion from the pancreatic tumor, this is borderline resectable disease. No evidence of distant metastasis.  -We previously reviewed the nature history of pancreatic cancer, and the overall survival rate with chemotherapy, surgery, and radiation. Patient and his wife was discouraged by the overall dismal long term survival rate (~20%)  -He was seen by surgeon Dr. Barry Dienes, Whipple surgery was discussed and offered to patient.  -I previously reviewed the high risk of cancer recurrence after surgery, and the role of neoadjuvant and adjuvant chemotherapy to reduce the risk of recurrence, and shrink the tumor before surgery. -He received neoadjuvant chemotherapy with FOLFIRINOX, tolerated well  -We previously reviewed his restaging CT scan  from 05/17/2016 in our GI tumor Board this morning, which showed a stable pancreatic mass, the tumor does not involve the SMA, but possible invading the port vein. -He has completed neoadjuvant concurrent chemoradiation  -CT CAP on 09/04/16 by Dr. Barry Dienes showed that the poorly defined pancreatic head mass was grossly stable with associated pancreatic ductal dilatation  and portacaval adenopathy and no associated vascular encasement, common bile duct stent in place with stable bilary ductal dilatation, left hepatic lobe atrophy, and hepatomegaly. -He underwent diagnostic laparoscopy and possible whipple procedure on 09/19/2016 by Dr. Barry Dienes to attempt removal of the pancreatic tumor. The tumor appeared to be encasing the SMV/portal vein confluence with blurring of the interface. The tumor also appeared to be invading the superior mesenteric artery on the right lateral surface. This point, the procedure was aborted. -Two liver lesions were noted and biopsy revealed benign hepatic parenchyma with assoicated fibrosis with no evidence of malignancy. -Pt and his wife are determined to do anything they can, to cure his cancer. They would like to explore other surgical option with other institution if possible. I will refer him to [facility]  -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 more chemotherapy to shrink his tumor. He had overall stable disease with FOLFIRINOX, I recommend him to try gemcitabine and Abraxane, which is also very good regimen to for pancreatic cancer, potential benefits and side effects, which includes but not limited to, fatigue, nausea, diarrhea, hair loss, neuropathy, cytopenia, neutropenic fever, need for blood transfusion, risk of bleeding, etc. were discussed with patient in detail, he agrees to proceed. -We discussed the goal of chemotherapy is palliative, to control his disease, hopefully shrink his tumor further, and he may be able to have surgery  -He has recovered well from surgery. Stitches were removed today. We'll  start chemotherapy in 1 week.  2. Type 2 DM - he will continue medication and follow up with his primary care physician Dr. Sarajane Jews  -We previously reviewed that his blood glucose will need to be monitored closely during the chemotherapy, and his medication may need to be adjusted  -His blood glucose has been fluctuating lately, he sees a diabetic specialist and now, and checking his finger glucose 3 times a day, better controlled lately   3. HTN -He is on amlodipine and benazepril  -His blood pressure is normal today -The previously discussed chemotherapy may affect his blood pressure, and he is medication may need to be adjusted  4. Goal of care discussion  -We again discussed the incurable nature of his cancer if it remains unresectable after more chemo, and the overall poor prognosis, especially if he does not have good response to chemotherapy or progress on chemo -The patient understands the goal of chemotherapy palliative and disease control.   Plan -Lab flush, follow-up, and chemotherapy with gemcitabine and Abraxane weekly for 3 weeks, then one week off, starting next Friday  -I will see him in 1 and 3 weeks -I will set up his referral to Pancreatic surgeon at [facility]   All questions were answered. The patient knows to call the clinic with any problems, questions or concerns.  I spent 25 minutes counseling the patient face to face. The total time spent in the appointment was 30 minutes and more than 50% was on counseling.    Truitt Merle, MD 09/29/2016

## 2016-09-29 ENCOUNTER — Ambulatory Visit (HOSPITAL_BASED_OUTPATIENT_CLINIC_OR_DEPARTMENT_OTHER): Payer: 59 | Admitting: Hematology

## 2016-09-29 ENCOUNTER — Encounter: Payer: Self-pay | Admitting: Hematology

## 2016-09-29 ENCOUNTER — Telehealth: Payer: Self-pay | Admitting: Hematology

## 2016-09-29 VITALS — BP 134/71 | HR 82 | Temp 98.5°F | Resp 18 | Ht 70.0 in | Wt 189.5 lb

## 2016-09-29 DIAGNOSIS — C250 Malignant neoplasm of head of pancreas: Secondary | ICD-10-CM

## 2016-09-29 DIAGNOSIS — E119 Type 2 diabetes mellitus without complications: Secondary | ICD-10-CM

## 2016-09-29 DIAGNOSIS — I10 Essential (primary) hypertension: Secondary | ICD-10-CM

## 2016-09-29 DIAGNOSIS — Z7189 Other specified counseling: Secondary | ICD-10-CM

## 2016-09-29 NOTE — Progress Notes (Signed)
DISCONTINUE OFF PATHWAY REGIMEN - Pancreatic     A cycle is every 14 days:     Oxaliplatin        Dose Mod: None     Leucovorin        Dose Mod: None     Irinotecan        Dose Mod: None     5-Fluorouracil        Dose Mod: None     5-Fluorouracil        Dose Mod: None  **Always confirm dose/schedule in your pharmacy ordering system**    REASON: Continuation Of Treatment PRIOR TREATMENT: [id number]: FOLFIRINOX q14 Days x 4 Cycles TREATMENT RESPONSE: Stable Disease (SD)  START OFF PATHWAY REGIMEN - Pancreatic   [id number]:Nab-Paclitaxel (Abraxane(R)) 125 mg/m2 D1, 8, 15 + Gemcitabine 1,000 mg/m2 D1, 8, 15 q28 Days:   A cycle is every 28 days:     Nab-paclitaxel (protein bound)        Dose Mod: None     Gemcitabine        Dose Mod: None  **Always confirm dose/schedule in your pharmacy ordering system**    Patient Characteristics: Adenocarcinoma, Borderline Resectable or High Risk, Potentially Resectable, Primary Neoadjuvant Therapy, PS = 0, 1 Histology: Adenocarcinoma   Intent of Therapy: Non-Curative / Palliative Intent, Discussed with Patient

## 2016-09-29 NOTE — Telephone Encounter (Signed)
Gave patient avs report and appointments for March  °

## 2016-10-02 ENCOUNTER — Other Ambulatory Visit: Payer: Self-pay | Admitting: Hematology

## 2016-10-05 ENCOUNTER — Other Ambulatory Visit: Payer: Self-pay | Admitting: *Deleted

## 2016-10-05 DIAGNOSIS — C250 Malignant neoplasm of head of pancreas: Secondary | ICD-10-CM

## 2016-10-05 NOTE — Progress Notes (Signed)
[name]  [phone] [phone]  Clinic Follow up Note   Patient Care Team: [name], MD as PCP - General 10/06/2016  CHIEF COMPLAINTS:  Follow up pancreatic cancer  Oncology History   Adenocarcinoma of head of pancreas [facility])   Staging form: Pancreas, AJCC 7th Edition   - Clinical stage from 03/09/2016: Stage IIB (T2, N1, M0) - Signed by Truitt Merle, MD on 03/17/2016      Adenocarcinoma of head of pancreas ([place])   02/27/2016 Imaging    CT chest, abdomen and pelvis with contrast showed ill-defined heterogeneity of pancreatic head, highly suspicious for malignancy, ) quit about and biliary ductal dilatation, mildly prominent lymph nodes in the upper abdomen, largest 2 cm in the portocaval . No other metastasis.       03/09/2016 Initial Diagnosis    Adenocarcinoma of head of pancreas ([place])      03/09/2016 Procedure    Upper EUS showed a 3.5 cm irregular mass in the pancreatic head, causing pancreatic and biliary duct obstruction. The mass involves the portal vein 422 mm, strongly suggesting invasion. There is suspicious nearby adenopathy      03/09/2016 Initial Biopsy    Fine-needle aspiration of the pancreatic mass from EUS showed malignant cells consistent with adenocarcinoma.       03/28/2016 - 05/24/2016 Chemotherapy    neoadjuvant chemo with FOLFIRINOX every 2 weeks, for 5 cycles       06/28/2016 - 08/10/2016 Radiation Therapy     neoadjuvant radiation      06/28/2016 - 08/10/2016 Chemotherapy    Xeloda 2000 mg in the morning and 1500 mg in the evening, on the day of radiation.      09/04/2016 Imaging    CT Chest Abdomen Pelvis IMPRESSION: 1. Poorly defined pancreatic head mass is grossly stable, with associated pancreatic ductal dilatation and portacaval adenopathy. No associated vascular encasement. 2. Common bile duct stent in place with stable biliary ductal dilatation. Left hepatic lobe atrophy. 3. Hepatomegaly.  Spleen  is at the upper limits normal in size. 4. Aortic atherosclerosis (ICD10-170.0). Coronary artery calcification.      09/19/2016 Surgery    Diagnostic laparoscopy and possible whipple procedure by Dr. Barry Dienes. Whipple procedure aborted due to the pancreatic cancer involving SMV, SMA and portal veins.      09/19/2016 Pathology Results    Two liver lesions were noted during diagnostic laparoscopy and biopsy revealed benign hepatic parenchyma with assoicated fibrosis with no evidence of malignancy.       HISTORY OF PRESENTING ILLNESS:  Colton Bush 61 y.o. male is here because of His newly diagnosed pancreatic cancer. He is accompanied by his wife to our multidisciplinary GI clinic today.  He noticed fatigue, anorexia and weight loss about 50bs in the past 4 months. It started when his endocrinologist changed his diabetic meds. He presented to hospital on 7/30 with jaundice. No pain or nausea. CT scan revealed a heterogeneous pancreatic head mass, highly suspicious for malignancy. Mild biliary and pancreatic duct dilatation, mildly prominent lymph nodes in the upper abdomen. He underwent ERCP and CBD stent placement, and subsequent upper EUS on 03/09/2016, which showed a 3.5 cm irregular mass in the pancreatic head, the mass involves the portal vein, no suspicious nearby adenopathy.  He has been feeling better after stent placement, with improved appetite and energy level. He feels normal again. No symptoms. BM is good, he has gained about 8lbs back.   CURRENT THERAPY: chemo Gemcitabine and  Abraxane weekly, on day 1, 8, and 15 every 28 days, starting on 10/06/2016  INTERIM HISTORY: [name] returns for follow up and initiating chemo. He reports he is feeling somewhat better. His pain at incision is "easing off," and he is tking pain medication as needed. He continues to take daily potassium supplements. His appetite and in the level has returned to normal.  MEDICAL HISTORY:  Past Medical History:   Diagnosis Date  . Arthritis    left hand  . Bronchitis 1977  . Cancer ([place]) 03/09/2016   pancreatic cancer  . Depression    takes Cymbalta daily  . Diabetes mellitus type II    sees [name]   . GERD (gastroesophageal reflux disease)    takes Omeprazole daily  . H/O hiatal hernia   . Hyperlipidemia    takes Zocor daily  . Hypertension    takes Amlodipine daily  . Neck pain    C4-7 stenosis and herniated disc  . Neuromuscular disorder ([place])    hiatal hernia  . Scoliosis    slight  . Spinal cord injury, C5-C7 ([place])    c4-c7  . Stiffness of hand joint    d/t cervical issues    SURGICAL HISTORY: Past Surgical History:  Procedure Laterality Date  . ANTERIOR CERVICAL DECOMP/DISCECTOMY FUSION  08/18/2011   Procedure: ANTERIOR CERVICAL DECOMPRESSION/DISCECTOMY FUSION 3 LEVELS;  Surgeon: Winfield Cunas, MD;  Location: [facility];  Service: Neurosurgery;  Laterality: N/A;  Anterior Cervical Four-Five/Five-Six/Six-Seven Decompression with Fusion, Plating, and Bonegraft  . CARPAL TUNNEL RELEASE  2013   bilateral, per Dr. Christella Noa   . COLONOSCOPY  10-30-14   per Dr. Olevia Perches, clear, repeat in 10 yrs   . egd with esophageal dilation  9-08   per Dr. Olevia Perches  . ERCP N/A 03/01/2016   Procedure: ENDOSCOPIC RETROGRADE CHOLANGIOPANCREATOGRAPHY (ERCP) with brushings and stent;  Surgeon: Doran Stabler, MD;  Location: [facility];  Service: Endoscopy;  Laterality: N/A;  . EUS N/A 03/09/2016   Procedure: ESOPHAGEAL ENDOSCOPIC ULTRASOUND (EUS) RADIAL;  Surgeon: Milus Banister, MD;  Location: [facility];  Service: Endoscopy;  Laterality: N/A;  . lymph nodes biopsy    . melanoma rt calf  1999  . PORTACATH PLACEMENT Left 03/22/2016   Procedure: INSERTION PORT-A-CATH;  Surgeon: Stark Klein, MD;  Location: [facility];  Service: General;  Laterality: Left;  . SPINE SURGERY    . TONSILLECTOMY     as a child  . ULNAR TUNNEL RELEASE  2013   right arm, per Dr. Christella Noa   . UPPER GASTROINTESTINAL  ENDOSCOPY    . WHIPPLE PROCEDURE N/A 09/19/2016   Procedure: DIAGNOSTIC LAPAROSCOPY, LAPAROSCOPIC LIVER BIOPSY, RETROPERITONEAL EXPLORATION, INTRAOPERATIVE ULTRASOUND;  Surgeon: Stark Klein, MD;  Location: [facility];  Service: General;  Laterality: N/A;    SOCIAL HISTORY: Social History   Social History  . Marital status: Married    Spouse name: [name]  . Number of children: N/A  . Years of education: N/A   Occupational History  . Driver    Social History Main Topics  . Smoking status: Never Smoker  . Smokeless tobacco: Never Used     Comment: tried a pipe 35 years ago   . Alcohol use No  . Drug use: No  . Sexual activity: Yes   Other Topics Concern  . Not on file   Social History Narrative   Married, wife Rosaria Ferries Nutritional therapist-   He works as a Geophysicist/field seismologist for  a company  He has one son 46 yo. Lives with his wife   FAMILY HISTORY: Family History  Problem Relation Age of Onset  . Heart disease Father   . Heart disease Brother 24  . Anesthesia problems Mother   . Heart disease Mother   . Dementia Mother   . Diabetes Sister   . Stroke Sister   . Colon cancer Neg Hx   . Rectal cancer Neg Hx   . Stomach cancer Neg Hx     ALLERGIES:  is allergic to no known allergies.  MEDICATIONS:  Current Outpatient Prescriptions  Medication Sig Dispense Refill  . amLODipine (NORVASC) 10 MG tablet TAKE 1 TABLET BY MOUTH EVERY DAY 30 tablet 6  . benazepril (LOTENSIN) 10 MG tablet TAKE 1 TABLET (10 MG TOTAL) BY MOUTH DAILY. 30 tablet 3  . FREESTYLE LITE test strip USE ONE STRIP TO CHECK GLUCOSE ONCE DAILY. PLEASE  SCHEDULE FOLLOW UP 50 each 0  . insulin aspart (NOVOLOG) 100 UNIT/ML injection Give every morning with breakfast and every evening with supper per the following sliding scale: give 3 units for glucose 150-175, give 6 units for glucose 176-200, give 9 units for glucose 201-250, give 12 units for glucose greater than 250 3 vial 11  . Insulin Detemir (LEVEMIR) 100 UNIT/ML Pen  Inject 15 Units into the skin 2 (two) times daily. 90 mL 3  . Insulin Pen Needle 30G X 5 MM MISC Use one daily with insulin 100 each 2  . JARDIANCE 25 MG TABS tablet TAKE 1 TABLET BY MOUTH EVERY DAY 90 tablet 1  . Lancets (FREESTYLE) lancets USE AS INSTRUCTED TO CHECK BLOOD SUGAR ONCE A DAY 100 each 2  . lidocaine-prilocaine (EMLA) cream Apply to affected area once 30 g 3  . loratadine (CLARITIN) 10 MG tablet Take 10 mg by mouth daily.    Marland Kitchen LORazepam (ATIVAN) 0.5 MG tablet Take 1 tablet (0.5 mg total) by mouth every 6 (six) hours as needed (Nausea or vomiting). 30 tablet 0  . Multiple Vitamin (MULTIVITAMIN WITH MINERALS) TABS tablet Take 1 tablet by mouth daily.    . naproxen sodium (ANAPROX) 220 MG tablet Take 440 mg by mouth 2 (two) times daily as needed (for pain.).    Marland Kitchen Omega-3 Fatty Acids (FISH OIL) 1200 MG CAPS Take 2,400 mg by mouth daily.    Marland Kitchen omeprazole (PRILOSEC) 20 MG capsule TAKE ONE CAPSULE BY MOUTH ONCE DAILY 90 capsule 3  . ondansetron (ZOFRAN) 8 MG tablet Take 1 tablet (8 mg total) by mouth 2 (two) times daily as needed (Nausea or vomiting). 30 tablet 1  . oxyCODONE (OXY IR/ROXICODONE) 5 MG immediate release tablet Take 1-2 tablets (5-10 mg total) by mouth every 4 (four) hours as needed for moderate pain, severe pain or breakthrough pain. 40 tablet 0  . potassium chloride SA (KLOR-CON M20) 20 MEQ tablet Take 1 tablet (20 mEq total) by mouth daily. 30 tablet 1  . prochlorperazine (COMPAZINE) 10 MG tablet Take 1 tablet (10 mg total) by mouth every 6 (six) hours as needed (Nausea or vomiting). 30 tablet 1  . capecitabine (XELODA) 500 MG tablet Take 4 tab in the morning and 3 tab in the evening, on the day of radiation only (Monday through Friday) (Patient not taking: Reported on 09/13/2016) 210 tablet 0   No current facility-administered medications for this visit.     REVIEW OF SYSTEMS:  Constitutional: Denies fevers, chills or abnormal night sweats (+) mild pain Eyes: Denies  blurriness of vision, double vision or watery eyes Ears, nose, mouth, throat, and face: Denies mucositis or sore throat Respiratory: Denies cough, dyspnea or wheezes Cardiovascular: Denies palpitation, chest discomfort or lower extremity swelling Gastrointestinal:  Denies nausea, heartburn or change in bowel habits Skin: Denies abnormal skin rashes Lymphatics: Denies new lymphadenopathy or easy bruising Neurological:Denies numbness, tingling or new weaknesses Behavioral/Psych: Mood is stable, no new changes  All other systems were reviewed with the patient and are negative.  PHYSICAL EXAMINATION:  ECOG PERFORMANCE STATUS: 1  Vitals:   10/06/16 1301  BP: 112/65  Pulse: 82  Resp: 16  Temp: 97.2 F (36.2 C)   Filed Weights   10/06/16 1301  Weight: 190 lb 4.8 oz (86.3 kg)    GENERAL:alert, no distress and comfortable.  SKIN: skin color, texture, turgor are normal, no rashes or significant lesions EYES: normal, conjunctiva are pink and non-injected, sclera clear OROPHARYNX:no exudate, no erythema and lips, buccal mucosa, and tongue normal  NECK: supple, thyroid normal size, non-tender, without nodularity LYMPH:  no palpable lymphadenopathy in the cervical, axillary or inguinal LUNGS: clear to auscultation and percussion with normal breathing effort HEART: regular rate & rhythm and no murmurs and no lower extremity edema ABDOMEN:abdomen soft, non-tender and normal bowel sounds Musculoskeletal:no cyanosis of digits and no clubbing  PSYCH: alert & oriented x 3 with fluent speech NEURO: no focal motor/sensory deficits  LABORATORY DATA:  I have reviewed the data as listed CBC Latest Ref Rng & Units 10/06/2016 09/25/2016 09/24/2016  WBC 4.0 - 10.3 10e3/uL 11.9(H) 7.0 6.9  Hemoglobin 13.0 - 17.1 g/dL 12.5(L) 11.5(L) 12.0(L)  Hematocrit 38.4 - 49.9 % 38.4 34.6(L) 36.1(L)  Platelets 140 - 400 10e3/uL 416(H) 192 203   CMP Latest Ref Rng & Units 10/06/2016 09/25/2016 09/24/2016  Glucose 70 -  140 mg/dl 71 149(H) 171(H)  BUN 7.0 - 26.0 mg/dL 15.6 14 10   Creatinine 0.7 - 1.3 mg/dL 0.7 0.67 0.46(L)  Sodium 136 - 145 mEq/L 142 138 137  Potassium 3.5 - 5.1 mEq/L 4.0 3.6 3.1(L)  Chloride 101 - 111 mmol/L - 102 101  CO2 22 - 29 mEq/L 29 28 26   Calcium 8.4 - 10.4 mg/dL 9.6 8.7(L) 8.8(L)  Total Protein 6.4 - 8.3 g/dL 7.7 - 6.9  Total Bilirubin 0.20 - 1.20 mg/dL 0.57 - 0.9  Alkaline Phos 40 - 150 U/L 540(H) - 169(H)  AST 5 - 34 U/L 92(H) - 20  ALT 0 - 55 U/L 55 - 22   CA19.9 (0-35 U/ML) 02/28/2016: 1497 03/24/2016: 648 04/25/2016: 599 05/24/2016: 368 07/18/2016: 51 08/28/16: 55 10/06/16: Pending  PATHOLOGY REPORT   Liver, biopsy 09/19/16 - BENIGN HEPATIC PARENCHYMA WITH ASSOCIATED FIBROSIS. - THERE IS NO EVIDENCE OF MALIGNANCY.  Diagnosis 03/09/2016 FINE NEEDLE ASPIRATION, ENDOSCOPIC, PANCREAS (SPECIMEN 1 OF 1 COLLECTED 03/09/16): MALIGNANT CELLS CONSISTENT WITH ADENOCARCINOMA. BACKGROUND NECROTIC DEBRIS AND ACUTE INFLAMMATION.  Diagnosis 03/01/2016 BILE DUCT BRUSHING(SPECIMEN 1 OF 1 COLLECTED 03/01/16): RARE ATYPICAL CELLS, SEE COMMENT.  RADIOGRAPHIC STUDIES: I have personally reviewed the radiological images as listed and agreed with the findings in the report. Dg Chest Port 1 View  Result Date: 09/19/2016 CLINICAL DATA:  Central line placement.  Postop appendectomy. EXAM: PORTABLE CHEST 1 VIEW COMPARISON:  CT 09/04/2016.  Chest x-ray 03/22/2016. FINDINGS: PowerPort catheter noted with tip at cavoatrial junction. NG tube noted with tip below left hemidiaphragm. Right IJ line noted with tip over the superior vena cava. Cardiomegaly with normal pulmonary vascularity. Bibasilar subsegmental atelectasis. No prominent pleural effusion  or pneumothorax. Free intraperitoneal air noted consistent with prior intra-abdominal surgery. Prior cervical spine fusion. Thoracolumbar spine scoliosis . IMPRESSION: 1. Lines and tubes noted as above. 2. Mild bibasilar subsegmental atelectasis. 3.  Cardiomegaly, no pulmonary venous congestion. 4. Free intraperitoneal air consistent with prior surgery. Electronically Signed   By: Marcello Moores  Register   On: 09/19/2016 11:00   EUS 03/09/2016 Dr. Ardis Hughs  - 3.5cm irregularly shaped, poorly defined mass in the pancreatic head. There is suspicious, nearby adenopathy. The mass is causing pancreatic and bililary duct obstruction (previously stented). The mass involves the portal vein for 63mm (abuttment and loss of usual tissue interface), strongly suggesting invasion. Preliminary cytology results are positive for malignancy (adenocarcinoma), await final report.  ASSESSMENT & PLAN: 61 y.o. Caucasian male, with past medical history of diabetes and hypertension, presented with epigastric pain, weight loss, and jaundice.  1. Primary pancreatic adenocarcinoma, in pancreatic head, cT2N1M0, stage IIB, unresectable by exploratory laparoscope.   -I have previously reviewed his CT abdomen and pelvis with contrast, EUS and biopsy findings with patient and his wife in great details. -His case was previously reviewed in our GI tumor board a few days ago. CT scan and US showed possible portal vein invasion from the pancreatic tumor, this is borderline resectable disease. No evidence of distant metastasis.  -We previously reviewed the nature history of pancreatic cancer, and the overall survival rate with chemotherapy, surgery, and radiation. Patient and his wife was discouraged by the overall dismal long term survival rate (~20%)  -He received 5 cycles of neoadjuvant chemotherapy FOLFIRINOX and neoadjuvant chemoradiation, tolerated well  -He was seen by surgeon Dr. Barry Dienes, Whipple surgery was attempted  but unfortunately aborted due to the unresectable pancreatic cancer -I previously reviewed the high risk of cancer recurrence after surgery, and the role of neoadjuvant and adjuvant chemotherapy to reduce the risk of recurrence, and shrink the tumor before surgery. -He  received neoadjuvant chemotherapy with FOLFIRINOX and neoadjuvant chemoradiation, tolerated well  -He previously underwent diagnostic laparoscopy and possible whipple procedure on 09/19/2016 by Dr. Barry Dienes to attempt removal of the pancreatic tumor. The tumor appeared to be encasing the SMV/portal vein confluence with blurring of the interface. The tumor also appeared to be invading the superior mesenteric artery on the right lateral surface. This point, the procedure was aborted. -Two liver lesions were noted and biopsy revealed benign hepatic parenchyma with assoicated fibrosis with no evidence of malignancy. -Pt and his wife are determined to do anything they can, to cure his cancer. They would like to explore other surgical option with other institution if possible. I will refer him to [facility] Dr. Mariah Milling, to see if IRE and whipple can be offered after more chemo  --He has recovered well from his recent surgery, lap reviewed, adequate for treatment, we'll start second line chemotherapy gemcitabine and Abraxane today. -We again discussed the goal of chemotherapy is palliative, to control his disease, hopefully shrink his tumor further, and he may be able to have surgery  -We reviewed side effects of chemo, especially cytopenias, risk of infections, neuropathy, hair loss, etc. The patient voiced good understanding and agrees to proceed.  -I advised patient to take potassium supplement every other day to see how his potassium levels do.  2. Type 2 DM - he will continue medication and follow up with his primary care physician Dr. Sarajane Jews  -We previously reviewed that his blood glucose will need to be monitored closely during the chemotherapy, and his medication may need to be  adjusted  -His blood glucose has been fluctuating lately, he sees a diabetic specialist and now, and checking his finger glucose 3 times a day, better controlled lately.  3. HTN -He is on amlodipine and benazepril  -His blood pressure is  normal today. -The previously discussed chemotherapy may affect his blood pressure, and he is medication may need to be adjusted  4. Goal of care discussion  -We previously discussed the incurable nature of his cancer if it remains unresectable after more chemo, and the overall poor prognosis, especially if he does not have good response to chemotherapy or progress on chemo -The patient understands the goal of chemotherapy palliative and disease control.   Plan -Labs reviewed, proceed with chemo gemcitabine and Abraxane  today and again in 1 week. -Follow up in 2 weeks  -I will set up his referral to Pancreatic surgeon Dr. Mariah Milling at [facility].  All questions were answered. The patient knows to call the clinic with any problems, questions or concerns.  I spent 25 minutes counseling the patient face to face. The total time spent in the appointment was 30 minutes and more than 50% was on counseling.  This document serves as a record of services personally performed by Truitt Merle, MD. It was created on her behalf by Brandt Loosen, a trained medical scribe. The creation of this record is based on the scribe's personal observations and the provider's statements to them. This document has been checked and approved by the attending provider.    Truitt Merle, MD 10/06/2016

## 2016-10-06 ENCOUNTER — Encounter: Payer: Self-pay | Admitting: Hematology

## 2016-10-06 ENCOUNTER — Ambulatory Visit (HOSPITAL_BASED_OUTPATIENT_CLINIC_OR_DEPARTMENT_OTHER): Payer: 59 | Admitting: Hematology

## 2016-10-06 ENCOUNTER — Ambulatory Visit (HOSPITAL_BASED_OUTPATIENT_CLINIC_OR_DEPARTMENT_OTHER): Payer: 59

## 2016-10-06 ENCOUNTER — Other Ambulatory Visit (HOSPITAL_BASED_OUTPATIENT_CLINIC_OR_DEPARTMENT_OTHER): Payer: 59

## 2016-10-06 ENCOUNTER — Ambulatory Visit: Payer: 59

## 2016-10-06 VITALS — BP 112/65 | HR 82 | Temp 97.2°F | Resp 16 | Ht 71.0 in | Wt 190.3 lb

## 2016-10-06 VITALS — BP 112/65 | HR 82 | Temp 97.2°F | Resp 16 | Ht 70.0 in | Wt 190.3 lb

## 2016-10-06 DIAGNOSIS — Z794 Long term (current) use of insulin: Secondary | ICD-10-CM

## 2016-10-06 DIAGNOSIS — C250 Malignant neoplasm of head of pancreas: Secondary | ICD-10-CM | POA: Diagnosis not present

## 2016-10-06 DIAGNOSIS — Z5111 Encounter for antineoplastic chemotherapy: Secondary | ICD-10-CM | POA: Diagnosis not present

## 2016-10-06 DIAGNOSIS — E1165 Type 2 diabetes mellitus with hyperglycemia: Secondary | ICD-10-CM

## 2016-10-06 DIAGNOSIS — I10 Essential (primary) hypertension: Secondary | ICD-10-CM | POA: Diagnosis not present

## 2016-10-06 DIAGNOSIS — E119 Type 2 diabetes mellitus without complications: Secondary | ICD-10-CM

## 2016-10-06 DIAGNOSIS — IMO0001 Reserved for inherently not codable concepts without codable children: Secondary | ICD-10-CM

## 2016-10-06 LAB — CBC WITH DIFFERENTIAL/PLATELET
BASO%: 1.4 % (ref 0.0–2.0)
Basophils Absolute: 0.2 10*3/uL — ABNORMAL HIGH (ref 0.0–0.1)
EOS%: 3.4 % (ref 0.0–7.0)
Eosinophils Absolute: 0.4 10*3/uL (ref 0.0–0.5)
HCT: 38.4 % (ref 38.4–49.9)
HGB: 12.5 g/dL — ABNORMAL LOW (ref 13.0–17.1)
LYMPH%: 5.7 % — ABNORMAL LOW (ref 14.0–49.0)
MCH: 29.4 pg (ref 27.2–33.4)
MCHC: 32.7 g/dL (ref 32.0–36.0)
MCV: 90 fL (ref 79.3–98.0)
MONO#: 0.7 10*3/uL (ref 0.1–0.9)
MONO%: 6.2 % (ref 0.0–14.0)
NEUT#: 9.9 10*3/uL — ABNORMAL HIGH (ref 1.5–6.5)
NEUT%: 83.3 % — ABNORMAL HIGH (ref 39.0–75.0)
Platelets: 416 10*3/uL — ABNORMAL HIGH (ref 140–400)
RBC: 4.27 10*6/uL (ref 4.20–5.82)
RDW: 15.6 % — ABNORMAL HIGH (ref 11.0–14.6)
WBC: 11.9 10*3/uL — ABNORMAL HIGH (ref 4.0–10.3)
lymph#: 0.7 10*3/uL — ABNORMAL LOW (ref 0.9–3.3)

## 2016-10-06 LAB — COMPREHENSIVE METABOLIC PANEL
ALT: 55 U/L (ref 0–55)
AST: 92 U/L — ABNORMAL HIGH (ref 5–34)
Albumin: 3.8 g/dL (ref 3.5–5.0)
Alkaline Phosphatase: 540 U/L — ABNORMAL HIGH (ref 40–150)
Anion Gap: 9 mEq/L (ref 3–11)
BUN: 15.6 mg/dL (ref 7.0–26.0)
CO2: 29 mEq/L (ref 22–29)
Calcium: 9.6 mg/dL (ref 8.4–10.4)
Chloride: 104 mEq/L (ref 98–109)
Creatinine: 0.7 mg/dL (ref 0.7–1.3)
EGFR: >90 mL/min/{1.73_m2} (ref >90)
Glucose: 71 mg/dl (ref 70–140)
Potassium: 4 mEq/L (ref 3.5–5.1)
Sodium: 142 mEq/L (ref 136–145)
Total Bilirubin: 0.57 mg/dL (ref 0.20–1.20)
Total Protein: 7.7 g/dL (ref 6.4–8.3)

## 2016-10-06 MED ORDER — SODIUM CHLORIDE 0.9 % IJ SOLN
10.0000 mL | Freq: Once | INTRAMUSCULAR | Status: AC
  Administered 2016-10-06: 10 mL
  Filled 2016-10-06: qty 10

## 2016-10-06 MED ORDER — HEPARIN SOD (PORK) LOCK FLUSH 100 UNIT/ML IV SOLN
500.0000 [IU] | Freq: Once | INTRAVENOUS | Status: AC | PRN
  Administered 2016-10-06: 500 [IU]
  Filled 2016-10-06: qty 5

## 2016-10-06 MED ORDER — PROCHLORPERAZINE MALEATE 10 MG PO TABS
ORAL_TABLET | ORAL | Status: AC
  Filled 2016-10-06: qty 1

## 2016-10-06 MED ORDER — ALTEPLASE 2 MG IJ SOLR
2.0000 mg | Freq: Once | INTRAMUSCULAR | Status: DC | PRN
  Filled 2016-10-06: qty 2

## 2016-10-06 MED ORDER — ONDANSETRON HCL 8 MG PO TABS: 8.0000 mg | ORAL_TABLET | Freq: Two times a day (BID) | ORAL | 1 refills | Status: DC | PRN

## 2016-10-06 MED ORDER — LORAZEPAM 0.5 MG PO TABS: 0.5000 mg | ORAL_TABLET | Freq: Four times a day (QID) | ORAL | 0 refills | Status: DC | PRN

## 2016-10-06 MED ORDER — PROCHLORPERAZINE MALEATE 10 MG PO TABS
10.0000 mg | ORAL_TABLET | Freq: Once | ORAL | Status: AC
  Administered 2016-10-06: 10 mg via ORAL

## 2016-10-06 MED ORDER — SODIUM CHLORIDE 0.9% FLUSH
10.0000 mL | INTRAVENOUS | Status: DC | PRN
  Administered 2016-10-06: 10 mL
  Filled 2016-10-06: qty 10

## 2016-10-06 MED ORDER — PROCHLORPERAZINE MALEATE 10 MG PO TABS: 10.0000 mg | ORAL_TABLET | Freq: Four times a day (QID) | ORAL | 1 refills | Status: DC | PRN

## 2016-10-06 MED ORDER — SODIUM CHLORIDE 0.9 % IV SOLN
2000.0000 mg | Freq: Once | INTRAVENOUS | Status: AC
  Administered 2016-10-06: 2000 mg via INTRAVENOUS
  Filled 2016-10-06: qty 52.6

## 2016-10-06 MED ORDER — PACLITAXEL PROTEIN-BOUND CHEMO INJECTION 100 MG
125.0000 mg/m2 | Freq: Once | INTRAVENOUS | Status: AC
  Administered 2016-10-06: 250 mg via INTRAVENOUS
  Filled 2016-10-06: qty 50

## 2016-10-06 MED ORDER — SODIUM CHLORIDE 0.9 % IV SOLN
Freq: Once | INTRAVENOUS | Status: AC
  Administered 2016-10-06: 15:00:00 via INTRAVENOUS

## 2016-10-06 MED ORDER — LIDOCAINE-PRILOCAINE 2.5-2.5 % EX CREA: TOPICAL_CREAM | CUTANEOUS | 3 refills | Status: DC

## 2016-10-06 NOTE — Patient Instructions (Signed)
[facility] Discharge Instructions for Patients Receiving Chemotherapy  Today you received the following chemotherapy agents: Abraxane and Gemzar   To help prevent nausea and vomiting after your treatment, we encourage you to take your nausea medication as directed.    If you develop nausea and vomiting that is not controlled by your nausea medication, call the clinic.   BELOW ARE SYMPTOMS THAT SHOULD BE REPORTED IMMEDIATELY:  *FEVER GREATER THAN 100.5 F  *CHILLS WITH OR WITHOUT FEVER  NAUSEA AND VOMITING THAT IS NOT CONTROLLED WITH YOUR NAUSEA MEDICATION  *UNUSUAL SHORTNESS OF BREATH  *UNUSUAL BRUISING OR BLEEDING  TENDERNESS IN MOUTH AND THROAT WITH OR WITHOUT PRESENCE OF ULCERS  *URINARY PROBLEMS  *BOWEL PROBLEMS  UNUSUAL RASH Items with * indicate a potential emergency and should be followed up as soon as possible.  Feel free to call the clinic you have any questions or concerns. The clinic phone number is [phone].  Please show the CHEMO ALERT CARD at check-in to the Emergency Department and triage nurse.   

## 2016-10-09 LAB — CANCER ANTIGEN 19-9: CA 19-9: 133 U/mL — ABNORMAL HIGH (ref 0–35)

## 2016-10-10 ENCOUNTER — Telehealth: Payer: Self-pay

## 2016-10-10 NOTE — Telephone Encounter (Signed)
-----   Message from Azzie Glatter, RN sent at 10/06/2016  4:54 PM EST ----- Regarding: "1st time chemotherapy--per Dr. [name]" Patient received Gemzar and Abraxane for the first time today, per Dr. [name]. Patient tolerated treatment well with no complaints.

## 2016-10-10 NOTE — Telephone Encounter (Signed)
A little sluggish Friday night. Joints been hurting over the weekend. Using motrin and tylenol with some relief.  Explained abraxane can cause leg pain and this eased his mind of why he was hurting. Suggested heat, and do take the claritin. A little nausea Friday night that meds helped. He is drinking about 30oz of water and encouraged to drink about 60 oz. He expressed appreciation for the call.

## 2016-10-13 ENCOUNTER — Ambulatory Visit: Payer: 59

## 2016-10-13 ENCOUNTER — Other Ambulatory Visit (HOSPITAL_BASED_OUTPATIENT_CLINIC_OR_DEPARTMENT_OTHER): Payer: 59

## 2016-10-13 ENCOUNTER — Other Ambulatory Visit: Payer: Self-pay | Admitting: Hematology

## 2016-10-13 ENCOUNTER — Ambulatory Visit (HOSPITAL_BASED_OUTPATIENT_CLINIC_OR_DEPARTMENT_OTHER): Payer: 59

## 2016-10-13 ENCOUNTER — Other Ambulatory Visit: Payer: Self-pay | Admitting: *Deleted

## 2016-10-13 VITALS — BP 140/75 | HR 80 | Temp 98.7°F | Resp 18

## 2016-10-13 DIAGNOSIS — Z95828 Presence of other vascular implants and grafts: Secondary | ICD-10-CM

## 2016-10-13 DIAGNOSIS — C250 Malignant neoplasm of head of pancreas: Secondary | ICD-10-CM | POA: Diagnosis not present

## 2016-10-13 DIAGNOSIS — Z5111 Encounter for antineoplastic chemotherapy: Secondary | ICD-10-CM

## 2016-10-13 LAB — CBC WITH DIFFERENTIAL/PLATELET
BASO%: 0 % (ref 0.0–2.0)
Basophils Absolute: 0 10*3/uL (ref 0.0–0.1)
EOS%: 0.6 % (ref 0.0–7.0)
Eosinophils Absolute: 0 10*3/uL (ref 0.0–0.5)
HCT: 31.9 % — ABNORMAL LOW (ref 38.4–49.9)
HGB: 10.7 g/dL — ABNORMAL LOW (ref 13.0–17.1)
LYMPH%: 5.4 % — ABNORMAL LOW (ref 14.0–49.0)
MCH: 29.6 pg (ref 27.2–33.4)
MCHC: 33.5 g/dL (ref 32.0–36.0)
MCV: 88.1 fL (ref 79.3–98.0)
MONO#: 0.4 10*3/uL (ref 0.1–0.9)
MONO%: 6.3 % (ref 0.0–14.0)
NEUT#: 5.9 10*3/uL (ref 1.5–6.5)
NEUT%: 87.7 % — ABNORMAL HIGH (ref 39.0–75.0)
Platelets: 138 10*3/uL — ABNORMAL LOW (ref 140–400)
RBC: 3.62 10*6/uL — ABNORMAL LOW (ref 4.20–5.82)
RDW: 14.6 % (ref 11.0–14.6)
WBC: 6.7 10*3/uL (ref 4.0–10.3)
lymph#: 0.4 10*3/uL — ABNORMAL LOW (ref 0.9–3.3)

## 2016-10-13 LAB — COMPREHENSIVE METABOLIC PANEL
ALT: 90 U/L — ABNORMAL HIGH (ref 0–55)
AST: 47 U/L — ABNORMAL HIGH (ref 5–34)
Albumin: 2.2 g/dL — ABNORMAL LOW (ref 3.5–5.0)
Alkaline Phosphatase: 425 U/L — ABNORMAL HIGH (ref 40–150)
Anion Gap: 6 mEq/L (ref 3–11)
BUN: 12 mg/dL (ref 7.0–26.0)
CO2: 19 mEq/L — ABNORMAL LOW (ref 22–29)
Calcium: 6.1 mg/dL — CL (ref 8.4–10.4)
Chloride: 118 mEq/L — ABNORMAL HIGH (ref 98–109)
Creatinine: 0.5 mg/dL — ABNORMAL LOW (ref 0.7–1.3)
EGFR: >90 mL/min/{1.73_m2} (ref >90)
Glucose: 169 mg/dl — ABNORMAL HIGH (ref 70–140)
Potassium: 2.2 mEq/L — CL (ref 3.5–5.1)
Sodium: 142 mEq/L (ref 136–145)
Total Bilirubin: 0.9 mg/dL (ref 0.20–1.20)
Total Protein: 4.5 g/dL — ABNORMAL LOW (ref 6.4–8.3)

## 2016-10-13 LAB — MAGNESIUM: Magnesium: 1.2 mg/dl — CL (ref 1.5–2.5)

## 2016-10-13 MED ORDER — SODIUM CHLORIDE 0.9 % IV SOLN
Freq: Once | INTRAVENOUS | Status: AC
  Administered 2016-10-13: 14:00:00 via INTRAVENOUS
  Filled 2016-10-13: qty 1000

## 2016-10-13 MED ORDER — SODIUM CHLORIDE 0.9 % IV SOLN
Freq: Once | INTRAVENOUS | Status: AC
  Administered 2016-10-13: 14:00:00 via INTRAVENOUS

## 2016-10-13 MED ORDER — HEPARIN SOD (PORK) LOCK FLUSH 100 UNIT/ML IV SOLN
500.0000 [IU] | Freq: Once | INTRAVENOUS | Status: AC | PRN
  Administered 2016-10-13: 500 [IU]
  Filled 2016-10-13: qty 5

## 2016-10-13 MED ORDER — PROCHLORPERAZINE MALEATE 10 MG PO TABS
ORAL_TABLET | ORAL | Status: AC
  Filled 2016-10-13: qty 1

## 2016-10-13 MED ORDER — SODIUM CHLORIDE 0.9 % IJ SOLN
10.0000 mL | INTRAMUSCULAR | Status: DC | PRN
  Administered 2016-10-13: 10 mL via INTRAVENOUS
  Filled 2016-10-13: qty 10

## 2016-10-13 MED ORDER — PROCHLORPERAZINE MALEATE 10 MG PO TABS
10.0000 mg | ORAL_TABLET | Freq: Once | ORAL | Status: AC
  Administered 2016-10-13: 10 mg via ORAL

## 2016-10-13 MED ORDER — SODIUM CHLORIDE 0.9% FLUSH
10.0000 mL | INTRAVENOUS | Status: DC | PRN
  Administered 2016-10-13: 10 mL
  Filled 2016-10-13: qty 10

## 2016-10-13 MED ORDER — SODIUM CHLORIDE 0.9 % IV SOLN: 2.0000 g | Freq: Once | INTRAVENOUS | Status: DC

## 2016-10-13 MED ORDER — PACLITAXEL PROTEIN-BOUND CHEMO INJECTION 100 MG
125.0000 mg/m2 | Freq: Once | INTRAVENOUS | Status: AC
  Administered 2016-10-13: 250 mg via INTRAVENOUS
  Filled 2016-10-13: qty 50

## 2016-10-13 MED ORDER — GEMCITABINE HCL CHEMO INJECTION 1 GM/26.3ML
2000.0000 mg | Freq: Once | INTRAVENOUS | Status: AC
  Administered 2016-10-13: 2000 mg via INTRAVENOUS
  Filled 2016-10-13: qty 52.6

## 2016-10-13 MED ORDER — MAGNESIUM OXIDE 400 (241.3 MG) MG PO TABS: 400.0000 mg | ORAL_TABLET | Freq: Every day | ORAL | 1 refills | Status: DC

## 2016-10-13 NOTE — Patient Instructions (Addendum)
Take calcium tablet, 1 each day, until directed otherwise.  Take Potassium Chloride tablets 20 meq three times a day for two days and then twice a day until you return to see your doctor.  [facility] Discharge Instructions for Patients Receiving Chemotherapy  Today you received the following chemotherapy agents Abraxane and Gemzar  To help prevent nausea and vomiting after your treatment, we encourage you to take your nausea medication as directed.    If you develop nausea and vomiting that is not controlled by your nausea medication, call the clinic.   BELOW ARE SYMPTOMS THAT SHOULD BE REPORTED IMMEDIATELY:  *FEVER GREATER THAN 100.5 F  *CHILLS WITH OR WITHOUT FEVER  NAUSEA AND VOMITING THAT IS NOT CONTROLLED WITH YOUR NAUSEA MEDICATION  *UNUSUAL SHORTNESS OF BREATH  *UNUSUAL BRUISING OR BLEEDING  TENDERNESS IN MOUTH AND THROAT WITH OR WITHOUT PRESENCE OF ULCERS  *URINARY PROBLEMS  *BOWEL PROBLEMS  UNUSUAL RASH Items with * indicate a potential emergency and should be followed up as soon as possible.  Feel free to call the clinic you have any questions or concerns. The clinic phone number is [phone].  Please show the [facility] at check-in to the Emergency Department and triage nurse.   Hypokalemia Hypokalemia means that the amount of potassium in the blood is lower than normal.Potassium is a chemical that helps regulate the amount of fluid in the body (electrolyte). It also stimulates muscle tightening (contraction) and helps nerves work properly.Normally, most of the body's potassium is inside of cells, and only a very small amount is in the blood. Because the amount in the blood is so small, minor changes to potassium levels in the blood can be life-threatening. What are the causes? This condition may be caused by:  Antibiotic medicine.  Diarrhea or vomiting. Taking too much of a medicine that helps you have a bowel movement (laxative)  can cause diarrhea and lead to hypokalemia.  Chronic kidney disease (CKD).  Medicines that help the body get rid of excess fluid (diuretics).  Eating disorders, such as bulimia.  Low magnesium levels in the body.  Sweating a lot. What are the signs or symptoms? Symptoms of this condition include:  Weakness.  Constipation.  Fatigue.  Muscle cramps.  Mental confusion.  Skipped heartbeats or irregular heartbeat (palpitations).  Tingling or numbness. How is this diagnosed? This condition is diagnosed with a blood test. How is this treated? Hypokalemia can be treated by taking potassium supplements by mouth or adjusting the medicines that you take. Treatment may also include eating more foods that contain a lot of potassium. If your potassium level is very low, you may need to get potassium through an IV tube in one of your veins and be monitored in the hospital. Follow these instructions at home:  Take over-the-counter and prescription medicines only as told by your health care provider. This includes vitamins and supplements.  Eat a healthy diet. A healthy diet includes fresh fruits and vegetables, whole grains, healthy fats, and lean proteins.  If instructed, eat more foods that contain a lot of potassium, such as:  Nuts, such as peanuts and pistachios.  Seeds, such as sunflower seeds and pumpkin seeds.  Peas, lentils, and lima beans.  Whole grain and bran cereals and breads.  Fresh fruits and vegetables, such as apricots, avocado, bananas, cantaloupe, kiwi, oranges, tomatoes, asparagus, and potatoes.  Orange juice.  Tomato juice.  Red meats.  Yogurt.  Keep all follow-up visits as told by  your health care provider. This is important. Contact a health care provider if:  You have weakness that gets worse.  You feel your heart pounding or racing.  You vomit.  You have diarrhea.  You have diabetes (diabetes mellitus) and you have trouble keeping your  blood sugar (glucose) in your target range. Get help right away if:  You have chest pain.  You have shortness of breath.  You have vomiting or diarrhea that lasts for more than 2 days.  You faint. This information is not intended to replace advice given to you by your health care provider. Make sure you discuss any questions you have with your health care provider. Document Released: 07/17/2005 Document Revised: 03/04/2016 Document Reviewed: 03/04/2016 Elsevier Interactive Patient Education  2017 [employer].

## 2016-10-13 NOTE — Progress Notes (Signed)
Ok to treat based on today's labs (CBC and CMP). He will receive IV potassium today and will also supplement calcium and magnesium via infusion another day.

## 2016-10-14 ENCOUNTER — Ambulatory Visit (HOSPITAL_BASED_OUTPATIENT_CLINIC_OR_DEPARTMENT_OTHER): Payer: 59

## 2016-10-14 DIAGNOSIS — C250 Malignant neoplasm of head of pancreas: Secondary | ICD-10-CM | POA: Diagnosis not present

## 2016-10-14 MED ORDER — SODIUM CHLORIDE 0.9% FLUSH
10.0000 mL | INTRAVENOUS | Status: DC | PRN
  Administered 2016-10-14: 10 mL via INTRAVENOUS
  Filled 2016-10-14: qty 10

## 2016-10-14 MED ORDER — SODIUM CHLORIDE 0.9 % IV SOLN
Freq: Once | INTRAVENOUS | Status: AC
  Administered 2016-10-14: 10:00:00 via INTRAVENOUS

## 2016-10-14 MED ORDER — HEPARIN SOD (PORK) LOCK FLUSH 100 UNIT/ML IV SOLN
500.0000 [IU] | Freq: Once | INTRAVENOUS | Status: AC
  Administered 2016-10-14: 500 [IU] via INTRAVENOUS
  Filled 2016-10-14: qty 5

## 2016-10-14 NOTE — Patient Instructions (Signed)
Hypomagnesemia Hypomagnesemia is a condition in which the level of magnesium in the blood is low. Magnesium is a mineral that is found in many foods. It is used in many different processes in the body. Hypomagnesemia can affect every organ in the body. It can cause life-threatening problems. What are the causes? Causes of hypomagnesemia include:  Not getting enough magnesium in your diet.  Malnutrition.  Problems with absorbing magnesium from the intestines.  Dehydration.  Alcohol abuse.  Vomiting.  Severe diarrhea.  Some medicines, including medicines that make you urinate more.  Certain diseases, such as kidney disease, diabetes, and overactive thyroid. What are the signs or symptoms?  Involuntary shaking or trembling of a body part (tremor).  Confusion.  Muscle weakness.  Sensitivity to light, sound, and touch.  Psychiatric issues, such as depression, irritability, or psychosis.  Sudden tightening of muscles (muscle spasms).  Tingling in the arms and legs.  A feeling of fluttering of the heart. These symptoms are more severe if magnesium levels drop suddenly. How is this diagnosed? To make a diagnosis, your health care provider will do a physical exam and order blood and urine tests. How is this treated? Treatment will depend on the cause and the severity of your condition. It may involve:  A magnesium supplement. This can be taken in pill form. It can also be given through an IV tube. This is usually done if the condition is severe.  Changes to your diet. You may be directed to eat foods that have a lot of magnesium, such as green leafy vegetables, peas, beans, and nuts.  Eliminating alcohol from your diet. Follow these instructions at home:  Include foods with magnesium in your diet. Foods that are rich in magnesium include green vegetables, beans, nuts and seeds, and whole grains.  Take medicines only as directed by your health care provider.  Take  magnesium supplements if your health care provider instructs you to do that. Take them as directed.  Have your magnesium levels monitored as directed by your health care provider.  When you are active, drink fluids that contain electrolytes.  Keep all follow-up visits as directed by your health care provider. This is important. Contact a health care provider if:  You get worse instead of better.  Your symptoms return. Get help right away if:  Your symptoms are severe. This information is not intended to replace advice given to you by your health care provider. Make sure you discuss any questions you have with your health care provider. Document Released: 04/12/2005 Document Revised: 12/23/2015 Document Reviewed: 03/02/2014 Elsevier Interactive Patient Education  2017 [employer].

## 2016-10-17 ENCOUNTER — Ambulatory Visit: Payer: Self-pay | Admitting: Hematology

## 2016-10-17 ENCOUNTER — Other Ambulatory Visit: Payer: Self-pay

## 2016-10-17 NOTE — Progress Notes (Signed)
[place]  Telephone:([phone]) [phone] Fax:([phone]) [phone]  Clinic Follow up Note   Patient Care Team: [name], MD as PCP - General 10/20/2016  CHIEF COMPLAINTS:  Follow up pancreatic cancer  Oncology History   Adenocarcinoma of head of pancreas [facility])   Staging form: Pancreas, AJCC 7th Edition   - Clinical stage from 03/09/2016: Stage IIB (T2, N1, M0) - Signed by Truitt Merle, MD on 03/17/2016      Adenocarcinoma of head of pancreas ([place])   02/27/2016 Imaging    CT chest, abdomen and pelvis with contrast showed ill-defined heterogeneity of pancreatic head, highly suspicious for malignancy, ) quit about and biliary ductal dilatation, mildly prominent lymph nodes in the upper abdomen, largest 2 cm in the portocaval . No other metastasis.       03/09/2016 Initial Diagnosis    Adenocarcinoma of head of pancreas ([place])      03/09/2016 Procedure    Upper EUS showed a 3.5 cm irregular mass in the pancreatic head, causing pancreatic and biliary duct obstruction. The mass involves the portal vein 422 mm, strongly suggesting invasion. There is suspicious nearby adenopathy      03/09/2016 Initial Biopsy    Fine-needle aspiration of the pancreatic mass from EUS showed malignant cells consistent with adenocarcinoma.       03/28/2016 - 05/24/2016 Chemotherapy    neoadjuvant chemo with FOLFIRINOX every 2 weeks, for 5 cycles       06/28/2016 - 08/10/2016 Radiation Therapy    Neoadjuvant radiation by Dr. [name] Site/dose:   Pancreas treated to 45 Gy in 25 fractions. The Pancreas was then boosted to 54 Gy in 5 fractions.      06/28/2016 - 08/10/2016 Chemotherapy    Xeloda 2000 mg in the morning and 1500 mg in the evening, on the day of radiation.      09/04/2016 Imaging    CT Chest Abdomen Pelvis IMPRESSION: 1. Poorly defined pancreatic head mass is grossly stable, with associated pancreatic ductal dilatation and portacaval adenopathy. No associated vascular encasement. 2.  Common bile duct stent in place with stable biliary ductal dilatation. Left hepatic lobe atrophy. 3. Hepatomegaly.  Spleen is at the upper limits normal in size. 4. Aortic atherosclerosis (ICD10-170.0). Coronary artery calcification.      09/19/2016 Surgery    Diagnostic laparoscopy and possible whipple procedure by Dr. Barry Dienes. Whipple procedure aborted due to the pancreatic cancer involving SMV, SMA and portal veins.      09/19/2016 Pathology Results    Two liver lesions were noted during diagnostic laparoscopy and biopsy revealed benign hepatic parenchyma with assoicated fibrosis with no evidence of malignancy.      10/06/2016 -  Chemotherapy    Gemcitabine and Abraxane weekly, on day 1, 8, and 15 every 28 days, starting on 10/06/2016       HISTORY OF PRESENTING ILLNESS:  Colton Bush 61 y.o. male is here because of His newly diagnosed pancreatic cancer. He is accompanied by his wife to our multidisciplinary GI clinic today.  He noticed fatigue, anorexia and weight loss about 50bs in the past 4 months. It started when his endocrinologist changed his diabetic meds. He presented to hospital on 7/30 with jaundice. No pain or nausea. CT scan revealed a heterogeneous pancreatic head mass, highly suspicious for malignancy. Mild biliary and pancreatic duct dilatation, mildly prominent lymph nodes in the upper abdomen. He underwent ERCP and CBD stent placement, and subsequent upper EUS on 03/09/2016, which showed a 3.5 cm irregular  mass in the pancreatic head, the mass involves the portal vein, no suspicious nearby adenopathy.  He has been feeling better after stent placement, with improved appetite and energy level. He feels normal again. No symptoms. BM is good, he has gained about 8lbs back.   CURRENT THERAPY: chemo Gemcitabine and Abraxane weekly, on day 1, 8, and 15 every 28 days, started on 10/06/2016  INTERIM HISTORY: Mr. Arnett returns for follow up and chemo. He reports he is feeling  somewhat okay. He reports joint pain for which he is taking Tramadol that Dr. Barry Dienes prescribed. He reports the pain has not changed after chemotherapy. He is fatigued.  MEDICAL HISTORY:  Past Medical History:  Diagnosis Date   Arthritis    left hand   Bronchitis 1977   Cancer ([place]) 03/09/2016   pancreatic cancer   Depression    takes Cymbalta daily   Diabetes mellitus type II    sees [name]    GERD (gastroesophageal reflux disease)    takes Omeprazole daily   H/O hiatal hernia    Hyperlipidemia    takes Zocor daily   Hypertension    takes Amlodipine daily   Neck pain    C4-7 stenosis and herniated disc   Neuromuscular disorder ([place])    hiatal hernia   Scoliosis    slight   Spinal cord injury, C5-C7 ([place])    c4-c7   Stiffness of hand joint    d/t cervical issues    SURGICAL HISTORY: Past Surgical History:  Procedure Laterality Date   ANTERIOR CERVICAL DECOMP/DISCECTOMY FUSION  08/18/2011   Procedure: ANTERIOR CERVICAL DECOMPRESSION/DISCECTOMY FUSION 3 LEVELS;  Surgeon: Winfield Cunas, MD;  Location: [place] [facility];  Service: Neurosurgery;  Laterality: N/A;  Anterior Cervical Four-Five/Five-Six/Six-Seven Decompression with Fusion, Plating, and Bonegraft   CARPAL TUNNEL RELEASE  2013   bilateral, per Dr. Christella Noa    COLONOSCOPY  10-30-14   per Dr. Olevia Perches, clear, repeat in 10 yrs    egd with esophageal dilation  9-08   per Dr. Olevia Perches   ERCP N/A 03/01/2016   Procedure: ENDOSCOPIC RETROGRADE CHOLANGIOPANCREATOGRAPHY (ERCP) with brushings and stent;  Surgeon: Doran Stabler, MD;  Location: [facility];  Service: Endoscopy;  Laterality: N/A;   EUS N/A 03/09/2016   Procedure: ESOPHAGEAL ENDOSCOPIC ULTRASOUND (EUS) RADIAL;  Surgeon: Milus Banister, MD;  Location: [facility];  Service: Endoscopy;  Laterality: N/A;   lymph nodes biopsy     melanoma rt calf  1999   PORTACATH PLACEMENT Left 03/22/2016   Procedure: INSERTION PORT-A-CATH;  Surgeon: Stark Klein, MD;  Location: [facility];  Service: General;  Laterality: Left;   SPINE SURGERY     TONSILLECTOMY     as a child   ULNAR TUNNEL RELEASE  2013   right arm, per Dr. Christella Noa    UPPER GASTROINTESTINAL ENDOSCOPY     WHIPPLE PROCEDURE N/A 09/19/2016   Procedure: DIAGNOSTIC LAPAROSCOPY, LAPAROSCOPIC LIVER BIOPSY, RETROPERITONEAL EXPLORATION, INTRAOPERATIVE ULTRASOUND;  Surgeon: Stark Klein, MD;  Location: [facility];  Service: General;  Laterality: N/A;    SOCIAL HISTORY: Social History   Social History   Marital status: Married    Spouse name: [name]   Number of children: N/A   Years of education: N/A   Occupational History   Driver    Social History Main Topics   Smoking status: Never Smoker   Smokeless tobacco: Never Used     Comment: tried a pipe 35 years ago    Alcohol  use No   Drug use: No   Sexual activity: Yes   Other Topics Concern   Not on file   Social History Narrative   Married, wife Rosaria Ferries Nutritional therapist-   He works as a Geophysicist/field seismologist for a company  He has one son 27 yo. Lives with his wife   FAMILY HISTORY: Family History  Problem Relation Age of Onset   Heart disease Father    Heart disease Brother 17   Anesthesia problems Mother    Heart disease Mother    Dementia Mother    Diabetes Sister    Stroke Sister    Colon cancer Neg Hx    Rectal cancer Neg Hx    Stomach cancer Neg Hx     ALLERGIES:  is allergic to no known allergies.  MEDICATIONS:  Current Outpatient Prescriptions  Medication Sig Dispense Refill   amLODipine (NORVASC) 10 MG tablet TAKE 1 TABLET BY MOUTH EVERY DAY 30 tablet 6   benazepril (LOTENSIN) 10 MG tablet TAKE 1 TABLET (10 MG TOTAL) BY MOUTH DAILY. 30 tablet 3   FREESTYLE LITE test strip USE ONE STRIP TO CHECK GLUCOSE ONCE DAILY. PLEASE  SCHEDULE FOLLOW UP 50 each 0   insulin aspart (NOVOLOG) 100 UNIT/ML injection Give every morning with breakfast and every evening with supper per the following sliding  scale: give 3 units for glucose 150-175, give 6 units for glucose 176-200, give 9 units for glucose 201-250, give 12 units for glucose greater than 250 3 vial 11   Insulin Detemir (LEVEMIR) 100 UNIT/ML Pen Inject 15 Units into the skin 2 (two) times daily. 90 mL 3   Insulin Pen Needle 30G X 5 MM MISC Use one daily with insulin 100 each 2   JARDIANCE 25 MG TABS tablet TAKE 1 TABLET BY MOUTH EVERY DAY 90 tablet 1   Lancets (FREESTYLE) lancets USE AS INSTRUCTED TO CHECK BLOOD SUGAR ONCE A DAY 100 each 2   lidocaine-prilocaine (EMLA) cream Apply to affected area once 30 g 3   loratadine (CLARITIN) 10 MG tablet Take 10 mg by mouth daily.     LORazepam (ATIVAN) 0.5 MG tablet Take 1 tablet (0.5 mg total) by mouth every 6 (six) hours as needed (Nausea or vomiting). 30 tablet 0   magnesium oxide (MAG-OX) 400 (241.3 Mg) MG tablet Take 1 tablet (400 mg total) by mouth daily. 90 tablet 1   Multiple Vitamin (MULTIVITAMIN WITH MINERALS) TABS tablet Take 1 tablet by mouth daily.     naproxen sodium (ANAPROX) 220 MG tablet Take 440 mg by mouth 2 (two) times daily as needed (for pain.).     Omega-3 Fatty Acids (FISH OIL) 1200 MG CAPS Take 2,400 mg by mouth daily.     omeprazole (PRILOSEC) 20 MG capsule TAKE ONE CAPSULE BY MOUTH ONCE DAILY 90 capsule 3   ondansetron (ZOFRAN) 8 MG tablet Take 1 tablet (8 mg total) by mouth 2 (two) times daily as needed (Nausea or vomiting). 30 tablet 1   oxyCODONE (OXY IR/ROXICODONE) 5 MG immediate release tablet Take 1-2 tablets (5-10 mg total) by mouth every 4 (four) hours as needed for moderate pain, severe pain or breakthrough pain. 40 tablet 0   potassium chloride SA (KLOR-CON M20) 20 MEQ tablet Take 1 tablet (20 mEq total) by mouth daily. 30 tablet 1   prochlorperazine (COMPAZINE) 10 MG tablet Take 1 tablet (10 mg total) by mouth every 6 (six) hours as needed (Nausea or vomiting). 30 tablet 1  traMADol (ULTRAM) 50 MG tablet Take 50 mg by mouth every 6 (six)  hours as needed.     No current facility-administered medications for this visit.    Facility-Administered Medications Ordered in Other Visits  Medication Dose Route Frequency Provider Last Rate Last Dose   sodium chloride flush (NS) 0.9 % injection 10 mL  10 mL Intracatheter PRN Truitt Merle, MD   10 mL at 10/20/16 1557    REVIEW OF SYSTEMS:  Constitutional: Denies fevers, chills or abnormal night sweats (+) mild joint pain (+) fatigue Eyes: Denies blurriness of vision, double vision or watery eyes Ears, nose, mouth, throat, and face: Denies mucositis or sore throat Respiratory: Denies cough, dyspnea or wheezes Cardiovascular: Denies palpitation, chest discomfort or lower extremity swelling Gastrointestinal:  Denies nausea, heartburn or change in bowel habits Skin: Denies abnormal skin rashes Lymphatics: Denies new lymphadenopathy or easy bruising Neurological:Denies numbness, tingling or new weaknesses Behavioral/Psych: Mood is stable, no new changes  All other systems were reviewed with the patient and are negative.  PHYSICAL EXAMINATION:  ECOG PERFORMANCE STATUS: 1  Vitals:   10/20/16 1255  BP: 116/68  Pulse: 73  Resp: 18  Temp: 98.4 F (36.9 C)   Filed Weights   10/20/16 1255  Weight: 187 lb 9.6 oz (85.1 kg)    GENERAL:alert, no distress and comfortable.  SKIN: skin color, texture, turgor are normal, no rashes or significant lesions EYES: normal, conjunctiva are pink and non-injected, sclera clear OROPHARYNX:no exudate, no erythema and lips, buccal mucosa, and tongue normal  NECK: supple, thyroid normal size, non-tender, without nodularity LYMPH:  no palpable lymphadenopathy in the cervical, axillary or inguinal LUNGS: clear to auscultation and percussion with normal breathing effort HEART: regular rate & rhythm and no murmurs and no lower extremity edema ABDOMEN:abdomen soft, non-tender and normal bowel sounds Musculoskeletal:no cyanosis of digits and no clubbing    PSYCH: alert & oriented x 3 with fluent speech NEURO: no focal motor/sensory deficits  LABORATORY DATA:  I have reviewed the data as listed CBC Latest Ref Rng & Units 10/20/2016 10/13/2016 10/06/2016  WBC 4.0 - 10.3 10e3/uL 3.9(L) 6.7 11.9(H)  Hemoglobin 13.0 - 17.1 g/dL 10.7(L) 10.7(L) 12.5(L)  Hematocrit 38.4 - 49.9 % 32.1(L) 31.9(L) 38.4  Platelets 140 - 400 10e3/uL 193 138(L) 416(H)   CMP Latest Ref Rng & Units 10/20/2016 10/13/2016 10/06/2016  Glucose 70 - 140 mg/dl 133 169(H) 71  BUN 7.0 - 26.0 mg/dL 17.7 12.0 15.6  Creatinine 0.7 - 1.3 mg/dL 0.7 0.5(L) 0.7  Sodium 136 - 145 mEq/L 139 142 142  Potassium 3.5 - 5.1 mEq/L 4.0 2.2(LL) 4.0  Chloride 101 - 111 mmol/L - - -  CO2 22 - 29 mEq/L 28 19(L) 29  Calcium 8.4 - 10.4 mg/dL 9.6 6.1(LL) 9.6  Total Protein 6.4 - 8.3 g/dL 7.6 4.5(L) 7.7  Total Bilirubin 0.20 - 1.20 mg/dL 0.45 0.90 0.57  Alkaline Phos 40 - 150 U/L 349(H) 425(H) 540(H)  AST 5 - 34 U/L 30 47(H) 92(H)  ALT 0 - 55 U/L 45 90(H) 55   CA19.9 (0-35 U/ML) 02/28/2016: 1497 03/24/2016: 648 04/25/2016: 599 05/24/2016: 368 07/18/2016: 51 08/28/16: 55 10/06/16: 133  PATHOLOGY REPORT   Liver, biopsy 09/19/16 - BENIGN HEPATIC PARENCHYMA WITH ASSOCIATED FIBROSIS. - THERE IS NO EVIDENCE OF MALIGNANCY.  Diagnosis 03/09/2016 FINE NEEDLE ASPIRATION, ENDOSCOPIC, PANCREAS (SPECIMEN 1 OF 1 COLLECTED 03/09/16): MALIGNANT CELLS CONSISTENT WITH ADENOCARCINOMA. BACKGROUND NECROTIC DEBRIS AND ACUTE INFLAMMATION.  Diagnosis 03/01/2016 BILE DUCT BRUSHING(SPECIMEN 1 OF 1 COLLECTED  03/01/16): RARE ATYPICAL CELLS, SEE COMMENT.  RADIOGRAPHIC STUDIES: I have personally reviewed the radiological images as listed and agreed with the findings in the report. No results found. EUS 03/09/2016 Dr. Ardis Hughs  - 3.5cm irregularly shaped, poorly defined mass in the pancreatic head. There is suspicious, nearby adenopathy. The mass is causing pancreatic and bililary duct obstruction (previously stented). The mass  involves the portal vein for 44mm (abuttment and loss of usual tissue interface), strongly suggesting invasion. Preliminary cytology results are positive for malignancy (adenocarcinoma), await final report.  ASSESSMENT & PLAN: 61 y.o. Caucasian male, with past medical history of diabetes and hypertension, presented with epigastric pain, weight loss, and jaundice.  1. Primary pancreatic adenocarcinoma, in pancreatic head, cT2N1M0, stage IIB, unresectable by exploratory laparoscope.   -I have previously reviewed his CT abdomen and pelvis with contrast, EUS and biopsy findings with patient and his wife in great details. -His case was previously reviewed in our GI tumor board a few days ago. CT scan and US showed possible portal vein invasion from the pancreatic tumor, this is borderline resectable disease. No evidence of distant metastasis.  -We previously reviewed the nature history of pancreatic cancer, and the overall survival rate with chemotherapy, surgery, and radiation. Patient and his wife was discouraged by the overall dismal long term survival rate (~20%)  -He received 5 cycles of neoadjuvant chemotherapy FOLFIRINOX and neoadjuvant chemoradiation, tolerated well  -He was seen by surgeon Dr. Barry Dienes, Whipple surgery was attempted  but unfortunately aborted due to the unresectable pancreatic cancer -I previously reviewed the high risk of cancer recurrence after surgery, and the role of neoadjuvant and adjuvant chemotherapy to reduce the risk of recurrence, and shrink the tumor before surgery. -He received neoadjuvant chemotherapy with FOLFIRINOX and neoadjuvant chemoradiation, tolerated well  -He previously underwent diagnostic laparoscopy and possible whipple procedure on 09/19/2016 by Dr. Barry Dienes to attempt removal of the pancreatic tumor. The tumor appeared to be encasing the SMV/portal vein confluence with blurring of the interface. The tumor also appeared to be invading the superior mesenteric  artery on the right lateral surface. This point, the procedure was aborted.  -Two liver lesions were noted and biopsy revealed benign hepatic parenchyma with assoicated fibrosis with no evidence of malignancy. -Pt and his wife are determined to do anything they can to cure his cancer. They would like to explore other surgical option with other institution if possible. I referred him to [facility] Dr. Mariah Milling, to see if IRE and whipple can be offered after more chemo.  --He has recovered well from his recent surgery, labs reviewed, adequate for treatment, we started second line chemotherapy gemcitabine and Abraxane on 10/06/16. -Labs reviewed. Continue second line chemotherapy with gemcitabine and abraxane. He is tolerating well. -I have referred him to surgeon Dr. Mariah Milling at at [facility] for second opinion about surgical resection with IRE  2. Type 2 DM - he will continue medication and follow up with his primary care physician Dr. Sarajane Jews  -We previously reviewed that his blood glucose will need to be monitored closely during the chemotherapy, and his medication may need to be adjusted  -His blood glucose has been fluctuating lately, he sees a diabetic specialist and now, and checking his finger glucose 3 times a day, better controlled lately.  3. HTN -He is on amlodipine and benazepril  -His blood pressure is normal today. -The previously discussed chemotherapy may affect his blood pressure, and he is medication may need to be adjusted  4. Hypokalemia -I advised  patient to take potassium supplement every day to see how his potassium levels do. -K 4.0 TODAY  5. Goal of care discussion  -We previously discussed the incurable nature of his cancer if it remains unresectable after more chemo, and the overall poor prognosis, especially if he does not have good response to chemotherapy or progress on chemo -The patient understands the goal of chemotherapy palliative and disease control. -he is full code now     Plan -Labs reviewed, proceed with C1D15 chemo gemcitabine and Abraxane today. -Lab, flush, gemcitabine and abraxane in 2, 3, and 4 weeks. -Follow up in 2 and 4 weeks  -I will set up his referral to Pancreatic surgeon Dr. Mariah Milling at [facility].  All questions were answered. The patient knows to call the clinic with any problems, questions or concerns.  I spent 25 minutes counseling the patient face to face. The total time spent in the appointment was 30 minutes and more than 50% was on counseling.    Truitt Merle, MD 10/20/2016   This document serves as a record of services personally performed by Truitt Merle, MD. It was created on her behalf by Darcus Austin, a trained medical scribe. The creation of this record is based on the scribe's personal observations and the provider's statements to them. This document has been checked and approved by the attending provider.

## 2016-10-20 ENCOUNTER — Ambulatory Visit (HOSPITAL_BASED_OUTPATIENT_CLINIC_OR_DEPARTMENT_OTHER): Payer: 59 | Admitting: Hematology

## 2016-10-20 ENCOUNTER — Ambulatory Visit: Payer: 59

## 2016-10-20 ENCOUNTER — Encounter: Payer: Self-pay | Admitting: Hematology

## 2016-10-20 ENCOUNTER — Other Ambulatory Visit (HOSPITAL_BASED_OUTPATIENT_CLINIC_OR_DEPARTMENT_OTHER): Payer: 59

## 2016-10-20 ENCOUNTER — Telehealth: Payer: Self-pay | Admitting: Hematology

## 2016-10-20 ENCOUNTER — Ambulatory Visit: Payer: 59 | Admitting: Nutrition

## 2016-10-20 ENCOUNTER — Ambulatory Visit (HOSPITAL_BASED_OUTPATIENT_CLINIC_OR_DEPARTMENT_OTHER): Payer: 59

## 2016-10-20 VITALS — BP 116/68 | HR 73 | Temp 98.4°F | Resp 18 | Ht 70.0 in | Wt 187.6 lb

## 2016-10-20 DIAGNOSIS — C250 Malignant neoplasm of head of pancreas: Secondary | ICD-10-CM

## 2016-10-20 DIAGNOSIS — IMO0001 Reserved for inherently not codable concepts without codable children: Secondary | ICD-10-CM

## 2016-10-20 DIAGNOSIS — Z95828 Presence of other vascular implants and grafts: Secondary | ICD-10-CM

## 2016-10-20 DIAGNOSIS — Z5111 Encounter for antineoplastic chemotherapy: Secondary | ICD-10-CM | POA: Diagnosis not present

## 2016-10-20 DIAGNOSIS — Z794 Long term (current) use of insulin: Secondary | ICD-10-CM

## 2016-10-20 DIAGNOSIS — E1165 Type 2 diabetes mellitus with hyperglycemia: Secondary | ICD-10-CM

## 2016-10-20 DIAGNOSIS — I10 Essential (primary) hypertension: Secondary | ICD-10-CM | POA: Diagnosis not present

## 2016-10-20 LAB — CBC WITH DIFFERENTIAL/PLATELET
BASO%: 0.8 % (ref 0.0–2.0)
Basophils Absolute: 0 10*3/uL (ref 0.0–0.1)
EOS%: 2.4 % (ref 0.0–7.0)
Eosinophils Absolute: 0.1 10*3/uL (ref 0.0–0.5)
HCT: 32.1 % — ABNORMAL LOW (ref 38.4–49.9)
HGB: 10.7 g/dL — ABNORMAL LOW (ref 13.0–17.1)
LYMPH%: 14.6 % (ref 14.0–49.0)
MCH: 29.7 pg (ref 27.2–33.4)
MCHC: 33.3 g/dL (ref 32.0–36.0)
MCV: 89.2 fL (ref 79.3–98.0)
MONO#: 0.3 10*3/uL (ref 0.1–0.9)
MONO%: 7.8 % (ref 0.0–14.0)
NEUT#: 2.9 10*3/uL (ref 1.5–6.5)
NEUT%: 74.4 % (ref 39.0–75.0)
Platelets: 193 10*3/uL (ref 140–400)
RBC: 3.6 10*6/uL — ABNORMAL LOW (ref 4.20–5.82)
RDW: 14.9 % — ABNORMAL HIGH (ref 11.0–14.6)
WBC: 3.9 10*3/uL — ABNORMAL LOW (ref 4.0–10.3)
lymph#: 0.6 10*3/uL — ABNORMAL LOW (ref 0.9–3.3)

## 2016-10-20 LAB — COMPREHENSIVE METABOLIC PANEL
ALT: 45 U/L (ref 0–55)
AST: 30 U/L (ref 5–34)
Albumin: 3.6 g/dL (ref 3.5–5.0)
Alkaline Phosphatase: 349 U/L — ABNORMAL HIGH (ref 40–150)
Anion Gap: 8 mEq/L (ref 3–11)
BUN: 17.7 mg/dL (ref 7.0–26.0)
CO2: 28 mEq/L (ref 22–29)
Calcium: 9.6 mg/dL (ref 8.4–10.4)
Chloride: 103 mEq/L (ref 98–109)
Creatinine: 0.7 mg/dL (ref 0.7–1.3)
EGFR: >90 mL/min/{1.73_m2} (ref >90)
Glucose: 133 mg/dl (ref 70–140)
Potassium: 4 mEq/L (ref 3.5–5.1)
Sodium: 139 mEq/L (ref 136–145)
Total Bilirubin: 0.45 mg/dL (ref 0.20–1.20)
Total Protein: 7.6 g/dL (ref 6.4–8.3)

## 2016-10-20 LAB — TECHNOLOGIST REVIEW

## 2016-10-20 MED ORDER — SODIUM CHLORIDE 0.9 % IV SOLN
Freq: Once | INTRAVENOUS | Status: AC
  Administered 2016-10-20: 14:00:00 via INTRAVENOUS

## 2016-10-20 MED ORDER — SODIUM CHLORIDE 0.9% FLUSH
10.0000 mL | INTRAVENOUS | Status: DC | PRN
  Administered 2016-10-20: 10 mL
  Filled 2016-10-20: qty 10

## 2016-10-20 MED ORDER — SODIUM CHLORIDE 0.9 % IJ SOLN
10.0000 mL | INTRAMUSCULAR | Status: DC | PRN
  Administered 2016-10-20: 10 mL via INTRAVENOUS
  Filled 2016-10-20: qty 10

## 2016-10-20 MED ORDER — PROCHLORPERAZINE MALEATE 10 MG PO TABS
10.0000 mg | ORAL_TABLET | Freq: Once | ORAL | Status: AC
  Administered 2016-10-20: 10 mg via ORAL

## 2016-10-20 MED ORDER — PROCHLORPERAZINE MALEATE 10 MG PO TABS
ORAL_TABLET | ORAL | Status: AC
  Filled 2016-10-20: qty 1

## 2016-10-20 MED ORDER — SODIUM CHLORIDE 0.9 % IV SOLN
2000.0000 mg | Freq: Once | INTRAVENOUS | Status: AC
  Administered 2016-10-20: 2000 mg via INTRAVENOUS
  Filled 2016-10-20: qty 52.6

## 2016-10-20 MED ORDER — HEPARIN SOD (PORK) LOCK FLUSH 100 UNIT/ML IV SOLN
500.0000 [IU] | Freq: Once | INTRAVENOUS | Status: AC | PRN
  Administered 2016-10-20: 500 [IU]
  Filled 2016-10-20: qty 5

## 2016-10-20 MED ORDER — PACLITAXEL PROTEIN-BOUND CHEMO INJECTION 100 MG
125.0000 mg/m2 | Freq: Once | INTRAVENOUS | Status: AC
  Administered 2016-10-20: 250 mg via INTRAVENOUS
  Filled 2016-10-20: qty 50

## 2016-10-20 NOTE — Patient Instructions (Signed)
[facility] Discharge Instructions for Patients Receiving Chemotherapy  Today you received the following chemotherapy agents: Abraxane and Gemzar   To help prevent nausea and vomiting after your treatment, we encourage you to take your nausea medication as directed.    If you develop nausea and vomiting that is not controlled by your nausea medication, call the clinic.   BELOW ARE SYMPTOMS THAT SHOULD BE REPORTED IMMEDIATELY:  *FEVER GREATER THAN 100.5 F  *CHILLS WITH OR WITHOUT FEVER  NAUSEA AND VOMITING THAT IS NOT CONTROLLED WITH YOUR NAUSEA MEDICATION  *UNUSUAL SHORTNESS OF BREATH  *UNUSUAL BRUISING OR BLEEDING  TENDERNESS IN MOUTH AND THROAT WITH OR WITHOUT PRESENCE OF ULCERS  *URINARY PROBLEMS  *BOWEL PROBLEMS  UNUSUAL RASH Items with * indicate a potential emergency and should be followed up as soon as possible.  Feel free to call the clinic you have any questions or concerns. The clinic phone number is [phone].  Please show the CHEMO ALERT CARD at check-in to the Emergency Department and triage nurse.   

## 2016-10-20 NOTE — Telephone Encounter (Signed)
Scheduled patients appts per 10/20/2016 los. Patient to receive schedule after treatment when finished.

## 2016-10-20 NOTE — Progress Notes (Signed)
Patient requested information on snacks that he could consume between meals. He has had continued weight loss with weight documented as 187.6 pounds March 23, down from 200.4 pounds February 23. Patient weighed 183.7 pounds August 2017. Glucose 133. Patient is drinking Glucerna and boost glucose control between meals. Provided soft, high-protein fact sheets along with snack suggestions for patient. Provided him with coupons for oral nutrition supplements. Questions were answered.  Teach back method used.  **Disclaimer: This note was dictated with voice recognition software. Similar sounding words can inadvertently be transcribed and this note may contain transcription errors which may not have been corrected upon publication of note.**

## 2016-10-21 ENCOUNTER — Other Ambulatory Visit: Payer: Self-pay | Admitting: Family Medicine

## 2016-10-23 ENCOUNTER — Telehealth: Payer: Self-pay | Admitting: Hematology

## 2016-10-23 ENCOUNTER — Telehealth: Payer: Self-pay | Admitting: *Deleted

## 2016-10-23 NOTE — Telephone Encounter (Signed)
"  I need to speak with Dr. Jae Dire.  I need to reschedule the appointment with Dr. Mariah Milling on October 31, 2016."   Call transferred after providing [facility] Surgeon's number.

## 2016-10-23 NOTE — Telephone Encounter (Signed)
Pt appt. With Dr. Mariah Milling is 10/31/16 @10 :00. Pt is aware Faxed path reports

## 2016-10-26 ENCOUNTER — Other Ambulatory Visit: Payer: Self-pay | Admitting: Family Medicine

## 2016-10-30 NOTE — Telephone Encounter (Signed)
Was seen by Dr. Mariah Milling on 10-25-2016 per EPIC notes.

## 2016-11-01 ENCOUNTER — Other Ambulatory Visit: Payer: Self-pay | Admitting: *Deleted

## 2016-11-01 NOTE — Progress Notes (Signed)
[name]  [phone] [phone]  Clinic Follow up Note   Patient Care Team: [name], MD as PCP - General 11/03/2016  CHIEF COMPLAINTS:  Follow up pancreatic cancer  Oncology History   Adenocarcinoma of head of pancreas [facility])   Staging form: Pancreas, AJCC 7th Edition   - Clinical stage from 03/09/2016: Stage IIB (T2, N1, M0) - Signed by Truitt Merle, MD on 03/17/2016      Adenocarcinoma of head of pancreas ([place])   02/27/2016 Imaging    CT chest, abdomen and pelvis with contrast showed ill-defined heterogeneity of pancreatic head, highly suspicious for malignancy, ) quit about and biliary ductal dilatation, mildly prominent lymph nodes in the upper abdomen, largest 2 cm in the portocaval . No other metastasis.       03/09/2016 Initial Diagnosis    Adenocarcinoma of head of pancreas ([place])      03/09/2016 Procedure    Upper EUS showed a 3.5 cm irregular mass in the pancreatic head, causing pancreatic and biliary duct obstruction. The mass involves the portal vein 422 mm, strongly suggesting invasion. There is suspicious nearby adenopathy      03/09/2016 Initial Biopsy    Fine-needle aspiration of the pancreatic mass from EUS showed malignant cells consistent with adenocarcinoma.       03/28/2016 - 05/24/2016 Chemotherapy    neoadjuvant chemo with FOLFIRINOX every 2 weeks, for 5 cycles       06/28/2016 - 08/10/2016 Radiation Therapy    Neoadjuvant radiation by Dr. [name] Site/dose:   Pancreas treated to 45 Gy in 25 fractions. The Pancreas was then boosted to 54 Gy in 5 fractions.      06/28/2016 - 08/10/2016 Chemotherapy    Xeloda 2000 mg in the morning and 1500 mg in the evening, on the day of radiation.      09/04/2016 Imaging    CT Chest Abdomen Pelvis IMPRESSION: 1. Poorly defined pancreatic head mass is grossly stable, with associated pancreatic ductal dilatation and portacaval adenopathy. No associated vascular encasement. 2.  Common bile duct stent in place with stable biliary ductal dilatation. Left hepatic lobe atrophy. 3. Hepatomegaly.  Spleen is at the upper limits normal in size. 4. Aortic atherosclerosis (ICD10-170.0). Coronary artery calcification.      09/19/2016 Surgery    Diagnostic laparoscopy and possible whipple procedure by Dr. Barry Dienes. Whipple procedure aborted due to the pancreatic cancer involving SMV, SMA and portal veins.      09/19/2016 Pathology Results    Two liver lesions were noted during diagnostic laparoscopy and biopsy revealed benign hepatic parenchyma with assoicated fibrosis with no evidence of malignancy.      10/06/2016 - 10/20/2016 Chemotherapy    Gemcitabine and Abraxane weekly, on day 1, 8, and 15 every 28 days, starting on 10/06/2016, held after1 cycle due to pending surgery at [facility]      10/31/2016 Imaging    CT CAP w contrast at [facility] 10/31/16 Impression: 1.Interval decrease in the size of the ill-defined pancreatic head mass. There is short segment abutment of the portal vein/superior mesenteric vein but no evidence of venous distortion. 2.No evidence of metastatic disease in the chest, abdomen and pelvis.       HISTORY OF PRESENTING ILLNESS:  Colton Bush 61 y.o. male is here because of His newly diagnosed pancreatic cancer. He is accompanied by his wife to our multidisciplinary GI clinic today.  He noticed fatigue, anorexia and weight loss about 50bs in the past 4 months.  It started when his endocrinologist changed his diabetic meds. He presented to hospital on 7/30 with jaundice. No pain or nausea. CT scan revealed a heterogeneous pancreatic head mass, highly suspicious for malignancy. Mild biliary and pancreatic duct dilatation, mildly prominent lymph nodes in the upper abdomen. He underwent ERCP and CBD stent placement, and subsequent upper EUS on 03/09/2016, which showed a 3.5 cm irregular mass in the pancreatic head, the mass involves the portal vein, no suspicious  nearby adenopathy.  He has been feeling better after stent placement, with improved appetite and energy level. He feels normal again. No symptoms. BM is good, he has gained about 8lbs back.   CURRENT THERAPY:  Surgery pending  INTERIM HISTORY: [name] returns for follow up. The patient saw Dr. Mariah Milling at [facility] on 10/31/16 who offered him Whipple surgery. He feels better than when he started chemo a month ago. He's fatigued and is looking forward to surgery.  MEDICAL HISTORY:  Past Medical History:  Diagnosis Date  . Arthritis    left hand  . Bronchitis 1977  . Cancer ([place]) 03/09/2016   pancreatic cancer  . Depression    takes Cymbalta daily  . Diabetes mellitus type II    sees [name]   . GERD (gastroesophageal reflux disease)    takes Omeprazole daily  . H/O hiatal hernia   . Hyperlipidemia    takes Zocor daily  . Hypertension    takes Amlodipine daily  . Neck pain    C4-7 stenosis and herniated disc  . Neuromuscular disorder ([place])    hiatal hernia  . Scoliosis    slight  . Spinal cord injury, C5-C7 ([place])    c4-c7  . Stiffness of hand joint    d/t cervical issues    SURGICAL HISTORY: Past Surgical History:  Procedure Laterality Date  . ANTERIOR CERVICAL DECOMP/DISCECTOMY FUSION  08/18/2011   Procedure: ANTERIOR CERVICAL DECOMPRESSION/DISCECTOMY FUSION 3 LEVELS;  Surgeon: Winfield Cunas, MD;  Location: [place] [facility]UR[facility] ORS;  Service: Neurosurgery;  Laterality: N/A;  Anterior Cervical Four-Five/Five-Six/Six-Seven Decompression with Fusion, Plating, and Bonegraft  . CARPAL TUNNEL RELEASE  2013   bilateral, per Dr. Christella Noa   . COLONOSCOPY  10-30-14   per Dr. Olevia Perches, clear, repeat in 10 yrs   . egd with esophageal dilation  9-08   per Dr. Olevia Perches  . ERCP N/A 03/01/2016   Procedure: ENDOSCOPIC RETROGRADE CHOLANGIOPANCREATOGRAPHY (ERCP) with brushings and stent;  Surgeon: Doran Stabler, MD;  Location: [facility] ENDOSCOPY;  Service: Endoscopy;  Laterality: N/A;  . EUS N/A 03/09/2016    Procedure: ESOPHAGEAL ENDOSCOPIC ULTRASOUND (EUS) RADIAL;  Surgeon: Milus Banister, MD;  Location: [facility] ENDOSCOPY;  Service: Endoscopy;  Laterality: N/A;  . lymph nodes biopsy    . melanoma rt calf  1999  . PORTACATH PLACEMENT Left 03/22/2016   Procedure: INSERTION PORT-A-CATH;  Surgeon: Stark Klein, MD;  Location: [facility] ORS;  Service: General;  Laterality: Left;  . SPINE SURGERY    . TONSILLECTOMY     as a child  . ULNAR TUNNEL RELEASE  2013   right arm, per Dr. Christella Noa   . UPPER GASTROINTESTINAL ENDOSCOPY    . WHIPPLE PROCEDURE N/A 09/19/2016   Procedure: DIAGNOSTIC LAPAROSCOPY, LAPAROSCOPIC LIVER BIOPSY, RETROPERITONEAL EXPLORATION, INTRAOPERATIVE ULTRASOUND;  Surgeon: Stark Klein, MD;  Location: [facility] OR;  Service: General;  Laterality: N/A;    SOCIAL HISTORY: Social History   Social History  . Marital status: Married    Spouse name: [name]  . Number  of children: N/A  . Years of education: N/A   Occupational History  . Driver    Social History Main Topics  . Smoking status: Never Smoker  . Smokeless tobacco: Never Used     Comment: tried a pipe 35 years ago   . Alcohol use No  . Drug use: No  . Sexual activity: Yes   Other Topics Concern  . Not on file   Social History Narrative   Married, wife Rosaria Ferries Nutritional therapist-   He works as a Geophysicist/field seismologist for a company  He has one son 46 yo. Lives with his wife   FAMILY HISTORY: Family History  Problem Relation Age of Onset  . Heart disease Father   . Heart disease Brother 75  . Anesthesia problems Mother   . Heart disease Mother   . Dementia Mother   . Diabetes Sister   . Stroke Sister   . Colon cancer Neg Hx   . Rectal cancer Neg Hx   . Stomach cancer Neg Hx     ALLERGIES:  is allergic to no known allergies.  MEDICATIONS:  Current Outpatient Prescriptions  Medication Sig Dispense Refill  . amLODipine (NORVASC) 10 MG tablet TAKE 1 TABLET BY MOUTH EVERY DAY 30 tablet 1  . benazepril (LOTENSIN) 10 MG tablet TAKE 1  TABLET (10 MG TOTAL) BY MOUTH DAILY. 30 tablet 2  . FREESTYLE LITE test strip USE ONE STRIP TO CHECK GLUCOSE ONCE DAILY. PLEASE  SCHEDULE FOLLOW UP 50 each 0  . insulin aspart (NOVOLOG) 100 UNIT/ML injection Give every morning with breakfast and every evening with supper per the following sliding scale: give 3 units for glucose 150-175, give 6 units for glucose 176-200, give 9 units for glucose 201-250, give 12 units for glucose greater than 250 3 vial 11  . Insulin Detemir (LEVEMIR) 100 UNIT/ML Pen Inject 15 Units into the skin 2 (two) times daily. 90 mL 3  . Insulin Pen Needle 30G X 5 MM MISC Use one daily with insulin 100 each 2  . JARDIANCE 25 MG TABS tablet TAKE 1 TABLET BY MOUTH EVERY DAY 90 tablet 1  . Lancets (FREESTYLE) lancets USE AS INSTRUCTED TO CHECK BLOOD SUGAR ONCE A DAY 100 each 2  . lidocaine-prilocaine (EMLA) cream Apply to affected area once 30 g 3  . loratadine (CLARITIN) 10 MG tablet Take 10 mg by mouth daily.    Marland Kitchen LORazepam (ATIVAN) 0.5 MG tablet Take 1 tablet (0.5 mg total) by mouth every 6 (six) hours as needed (Nausea or vomiting). 30 tablet 0  . magnesium oxide (MAG-OX) 400 (241.3 Mg) MG tablet Take 1 tablet (400 mg total) by mouth daily. 90 tablet 1  . Multiple Vitamin (MULTIVITAMIN WITH MINERALS) TABS tablet Take 1 tablet by mouth daily.    . naproxen sodium (ANAPROX) 220 MG tablet Take 440 mg by mouth 2 (two) times daily as needed (for pain.).    Marland Kitchen Omega-3 Fatty Acids (FISH OIL) 1200 MG CAPS Take 2,400 mg by mouth daily.    Marland Kitchen omeprazole (PRILOSEC) 20 MG capsule TAKE ONE CAPSULE BY MOUTH ONCE DAILY 90 capsule 3  . ondansetron (ZOFRAN) 8 MG tablet Take 1 tablet (8 mg total) by mouth 2 (two) times daily as needed (Nausea or vomiting). 30 tablet 1  . oxyCODONE (OXY IR/ROXICODONE) 5 MG immediate release tablet Take 1-2 tablets (5-10 mg total) by mouth every 4 (four) hours as needed for moderate pain, severe pain or breakthrough pain. 40 tablet  0  . potassium chloride SA  (KLOR-CON M20) 20 MEQ tablet Take 1 tablet (20 mEq total) by mouth daily. 30 tablet 1  . prochlorperazine (COMPAZINE) 10 MG tablet Take 1 tablet (10 mg total) by mouth every 6 (six) hours as needed (Nausea or vomiting). 30 tablet 1  . traMADol (ULTRAM) 50 MG tablet Take 50 mg by mouth every 6 (six) hours as needed.     No current facility-administered medications for this visit.     REVIEW OF SYSTEMS:  Constitutional: Denies fevers, chills or abnormal night sweats (+) mild joint pain (+) fatigue Eyes: Denies blurriness of vision, double vision or watery eyes Ears, nose, mouth, throat, and face: Denies mucositis or sore throat Respiratory: Denies cough, dyspnea or wheezes Cardiovascular: Denies palpitation, chest discomfort or lower extremity swelling Gastrointestinal:  Denies nausea, heartburn or change in bowel habits Skin: Denies abnormal skin rashes Lymphatics: Denies new lymphadenopathy or easy bruising Neurological:Denies numbness, tingling or new weaknesses Behavioral/Psych: Mood is stable, no new changes  All other systems were reviewed with the patient and are negative.  PHYSICAL EXAMINATION:  ECOG PERFORMANCE STATUS: 1  Vitals:   11/03/16 1308  BP: (!) 150/74  Pulse: 80  Resp: 18  Temp: 98.5 F (36.9 C)   Filed Weights   11/03/16 1308  Weight: 198 lb 4.8 oz (89.9 kg)    GENERAL:alert, no distress and comfortable.  SKIN: skin color, texture, turgor are normal, no rashes or significant lesions EYES: normal, conjunctiva are pink and non-injected, sclera clear OROPHARYNX:no exudate, no erythema and lips, buccal mucosa, and tongue normal  NECK: supple, thyroid normal size, non-tender, without nodularity LYMPH:  no palpable lymphadenopathy in the cervical, axillary or inguinal LUNGS: clear to auscultation and percussion with normal breathing effort HEART: regular rate & rhythm and no murmurs and no lower extremity edema ABDOMEN:abdomen soft, non-tender and normal bowel  sounds Musculoskeletal:no cyanosis of digits and no clubbing  PSYCH: alert & oriented x 3 with fluent speech NEURO: no focal motor/sensory deficits  LABORATORY DATA:  I have reviewed the data as listed CBC Latest Ref Rng & Units 10/20/2016 10/13/2016 10/06/2016  WBC 4.0 - 10.3 10e3/uL 3.9(L) 6.7 11.9(H)  Hemoglobin 13.0 - 17.1 g/dL 10.7(L) 10.7(L) 12.5(L)  Hematocrit 38.4 - 49.9 % 32.1(L) 31.9(L) 38.4  Platelets 140 - 400 10e3/uL 193 138(L) 416(H)   CMP Latest Ref Rng & Units 10/20/2016 10/13/2016 10/06/2016  Glucose 70 - 140 mg/dl 133 169(H) 71  BUN 7.0 - 26.0 mg/dL 17.7 12.0 15.6  Creatinine 0.7 - 1.3 mg/dL 0.7 0.5(L) 0.7  Sodium 136 - 145 mEq/L 139 142 142  Potassium 3.5 - 5.1 mEq/L 4.0 2.2(LL) 4.0  Chloride 101 - 111 mmol/L - - -  CO2 22 - 29 mEq/L 28 19(L) 29  Calcium 8.4 - 10.4 mg/dL 9.6 6.1(LL) 9.6  Total Protein 6.4 - 8.3 g/dL 7.6 4.5(L) 7.7  Total Bilirubin 0.20 - 1.20 mg/dL 0.45 0.90 0.57  Alkaline Phos 40 - 150 U/L 349(H) 425(H) 540(H)  AST 5 - 34 U/L 30 47(H) 92(H)  ALT 0 - 55 U/L 45 90(H) 55      CA19.9 (0-35 U/ML) 02/28/2016: 1497 03/24/2016: 648 04/25/2016: 599 05/24/2016: 368 07/18/2016: 51 08/28/16: 55 10/06/16: 133  PATHOLOGY REPORT   Liver, biopsy 09/19/16 - BENIGN HEPATIC PARENCHYMA WITH ASSOCIATED FIBROSIS. - THERE IS NO EVIDENCE OF MALIGNANCY.  Diagnosis 03/09/2016 FINE NEEDLE ASPIRATION, ENDOSCOPIC, PANCREAS (SPECIMEN 1 OF 1 COLLECTED 03/09/16): MALIGNANT CELLS CONSISTENT WITH ADENOCARCINOMA. BACKGROUND NECROTIC DEBRIS AND ACUTE  INFLAMMATION.  Diagnosis 03/01/2016 BILE DUCT BRUSHING(SPECIMEN 1 OF 1 COLLECTED 03/01/16): RARE ATYPICAL CELLS, SEE COMMENT.  RADIOGRAPHIC STUDIES: I have personally reviewed the radiological images as listed and agreed with the findings in the report. No results found.  EUS 03/09/2016 Dr. Ardis Hughs  - 3.5cm irregularly shaped, poorly defined mass in the pancreatic head. There is suspicious, nearby adenopathy. The mass is causing  pancreatic and bililary duct obstruction (previously stented). The mass involves the portal vein for 53mm (abuttment and loss of usual tissue interface), strongly suggesting invasion. Preliminary cytology results are positive for malignancy (adenocarcinoma), await final report.  ASSESSMENT & PLAN: 61 y.o. Caucasian male, with past medical history of diabetes and hypertension, presented with epigastric pain, weight loss, and jaundice.  1. Primary pancreatic adenocarcinoma, in pancreatic head, cT2N1M0, stage IIB, unresectable by exploratory laparoscope.   -I have previously reviewed his CT abdomen and pelvis with contrast, EUS and biopsy findings with patient and his wife in great details. -His case was previously reviewed in our GI tumor board a few days ago. CT scan and US showed possible portal vein invasion from the pancreatic tumor, this is borderline resectable disease. No evidence of distant metastasis.  -We previously reviewed the nature history of pancreatic cancer, and the overall survival rate with chemotherapy, surgery, and radiation. Patient and his wife was discouraged by the overall dismal long term survival rate (~20%)  -He received neoadjuvant chemotherapy with FOLFIRINOX and neoadjuvant chemoradiation, tolerated well  -He previously underwent diagnostic laparoscopy and possible whipple procedure on 09/19/2016 by Dr. Barry Dienes to attempt removal of the pancreatic tumor. The tumor appeared to be encasing the SMV/portal vein confluence with blurring of the interface. The tumor also appeared to be invading the superior mesenteric artery on the right lateral surface. This point, the procedure was aborted. -he then resumed chemo, received one cycle of gemcitabine and Abraxane -I have referred him to surgeon Dr. Mariah Milling at at [facility] for second opinion about surgical resection with IRE. The patient saw Dr. Mariah Milling on 10/31/16. CT scan at the time showed interval decrease in size of the pancreatic head mass,  short segment abutment of the portal vein/superior mesenteric vein (but no evidence of venous distortion), and no evidence of metastatic disease in the chest, abdomen and pelvis. Dr. Mariah Milling offered Whipple surgery to be performed on 11/17/16. -Labs reviewed from [facility] from 10/31/16. Hold second line chemotherapy with gemcitabine and abraxane due to pending surgery. -We discussed the role of adjuvant chemotherapy after complete surgical resection. If he recovers well, I'll offer him 2 months of gemcitabine and Xeloda.  2. Type 2 DM - he will continue medication and follow up with his primary care physician Dr. Sarajane Jews  -We previously reviewed that his blood glucose will need to be monitored closely during the chemotherapy, and his medication may need to be adjusted  -His blood glucose has been fluctuating lately, he sees a diabetic specialist and now, and checking his finger glucose 3 times a day, better controlled lately.  3. HTN -He is on amlodipine and benazepril  -His blood pressure is normal today. -The previously discussed chemotherapy may affect his blood pressure, and he is medication may need to be adjusted  4. Hypokalemia -I advised patient to take potassium supplement every day to see how his potassium levels do. -resolved. K 4.0 on 10/20/16.  5. Goal of care discussion  -We previously discussed the incurable nature of his cancer if it remains unresectable after more chemo, and the overall poor prognosis,  especially if he does not have good response to chemotherapy or progress on chemo -The patient understands the goal of chemotherapy palliative and disease control. -he is full code now    Plan -Lab and f/u in 5 weeks. -Whipple surgery at [facility] in 2 weeks.  All questions were answered. The patient knows to call the clinic with any problems, questions or concerns.  I spent 15 minutes counseling the patient face to face. The total time spent in the appointment was 20 minutes and more than  50% was on counseling.    Truitt Merle, MD 11/03/2016   This document serves as a record of services personally performed by Truitt Merle, MD. It was created on her behalf by Darcus Austin, a trained medical scribe. The creation of this record is based on the scribe's personal observations and the provider's statements to them. This document has been checked and approved by the attending provider.

## 2016-11-02 ENCOUNTER — Telehealth: Payer: Self-pay | Admitting: Hematology

## 2016-11-02 ENCOUNTER — Telehealth: Payer: Self-pay | Admitting: *Deleted

## 2016-11-02 NOTE — Telephone Encounter (Signed)
Pt called wanting to know if he should keep office visit appt with Dr. [name] on 11/03/16.   Spoke with pt, and was informed that :  Pt was instructed by [facility] to cancel chemo for 11/03/16 - in preparation for pancreatic surgery on 11/17/16. Instructed pt to keep lab and office visit with Dr. [name] as scheduled for 11/03/16.  Pt voiced understanding. Pt's   Phone    [phone].

## 2016-11-02 NOTE — Telephone Encounter (Signed)
Per 4/5 schedule message cancel lab/chemo tomorrow - keep f/u tomorrow and cancel all other appointments after 4/6. Spoke with patient he is aware and confirmed 4/6 f/u only at 1:15 pm.

## 2016-11-03 ENCOUNTER — Ambulatory Visit: Payer: Self-pay

## 2016-11-03 ENCOUNTER — Other Ambulatory Visit: Payer: Self-pay

## 2016-11-03 ENCOUNTER — Encounter: Payer: Self-pay | Admitting: Hematology

## 2016-11-03 ENCOUNTER — Ambulatory Visit (HOSPITAL_BASED_OUTPATIENT_CLINIC_OR_DEPARTMENT_OTHER): Payer: 59 | Admitting: Hematology

## 2016-11-03 ENCOUNTER — Telehealth: Payer: Self-pay | Admitting: Hematology

## 2016-11-03 VITALS — BP 150/74 | HR 80 | Temp 98.5°F | Resp 18 | Ht 70.0 in | Wt 198.3 lb

## 2016-11-03 DIAGNOSIS — E1165 Type 2 diabetes mellitus with hyperglycemia: Secondary | ICD-10-CM

## 2016-11-03 DIAGNOSIS — E876 Hypokalemia: Secondary | ICD-10-CM | POA: Diagnosis not present

## 2016-11-03 DIAGNOSIS — I10 Essential (primary) hypertension: Secondary | ICD-10-CM | POA: Diagnosis not present

## 2016-11-03 DIAGNOSIS — E119 Type 2 diabetes mellitus without complications: Secondary | ICD-10-CM

## 2016-11-03 DIAGNOSIS — IMO0001 Reserved for inherently not codable concepts without codable children: Secondary | ICD-10-CM

## 2016-11-03 DIAGNOSIS — Z794 Long term (current) use of insulin: Secondary | ICD-10-CM

## 2016-11-03 DIAGNOSIS — C250 Malignant neoplasm of head of pancreas: Secondary | ICD-10-CM | POA: Diagnosis not present

## 2016-11-03 NOTE — Telephone Encounter (Signed)
Appointments scheduled per 4.6.18 LOS. Patient given AVS report and calendars with future scheduled appointments. °

## 2016-11-09 ENCOUNTER — Other Ambulatory Visit: Payer: Self-pay | Admitting: Endocrinology

## 2016-11-10 ENCOUNTER — Other Ambulatory Visit: Payer: Self-pay

## 2016-11-10 ENCOUNTER — Ambulatory Visit: Payer: Self-pay

## 2016-11-17 ENCOUNTER — Ambulatory Visit: Payer: Self-pay | Admitting: Hematology

## 2016-11-17 ENCOUNTER — Other Ambulatory Visit: Payer: Self-pay

## 2016-11-17 ENCOUNTER — Ambulatory Visit: Payer: Self-pay

## 2016-12-07 NOTE — Progress Notes (Signed)
[name]  [phone] [phone]  Clinic Follow up Note   Patient Care Team: [name], MD as PCP - General 12/08/2016  CHIEF COMPLAINTS:  Follow up pancreatic cancer  Oncology History   Cancer Staging Adenocarcinoma of head of pancreas [name]) Staging form: Pancreas, AJCC 7th Edition - Clinical stage from 03/09/2016: Stage IIB (T2, N1, M0) - Signed by Truitt Merle, MD on 03/17/2016 - Pathologic stage from 11/17/2016: Stage IB (yT2, N0, cM0) - Signed by Truitt Merle, MD on 12/10/2016       Adenocarcinoma of head of pancreas ([place])   02/27/2016 Imaging    CT chest, abdomen and pelvis with contrast showed ill-defined heterogeneity of pancreatic head, highly suspicious for malignancy, ) quit about and biliary ductal dilatation, mildly prominent lymph nodes in the upper abdomen, largest 2 cm in the portocaval . No other metastasis.       03/09/2016 Initial Diagnosis    Adenocarcinoma of head of pancreas ([place])      03/09/2016 Procedure    Upper EUS showed a 3.5 cm irregular mass in the pancreatic head, causing pancreatic and biliary duct obstruction. The mass involves the portal vein 422 mm, strongly suggesting invasion. There is suspicious nearby adenopathy      03/09/2016 Initial Biopsy    Fine-needle aspiration of the pancreatic mass from EUS showed malignant cells consistent with adenocarcinoma.       03/28/2016 - 05/24/2016 Chemotherapy    neoadjuvant chemo with FOLFIRINOX every 2 weeks, for 5 cycles       06/28/2016 - 08/10/2016 Radiation Therapy    Neoadjuvant radiation by Dr. [name] Site/dose:   Pancreas treated to 45 Gy in 25 fractions. The Pancreas was then boosted to 54 Gy in 5 fractions.      06/28/2016 - 08/10/2016 Chemotherapy    Xeloda 2000 mg in the morning and 1500 mg in the evening, on the day of radiation.      09/04/2016 Imaging    CT Chest Abdomen Pelvis IMPRESSION: 1. Poorly defined pancreatic head mass is grossly stable,  with associated pancreatic ductal dilatation and portacaval adenopathy. No associated vascular encasement. 2. Common bile duct stent in place with stable biliary ductal dilatation. Left hepatic lobe atrophy. 3. Hepatomegaly.  Spleen is at the upper limits normal in size. 4. Aortic atherosclerosis (ICD10-170.0). Coronary artery calcification.      09/19/2016 Surgery    Diagnostic laparoscopy and possible whipple procedure by Dr. Barry Dienes. Whipple procedure aborted due to the pancreatic cancer involving SMV, SMA and portal veins.      09/19/2016 Pathology Results    Two liver lesions were noted during diagnostic laparoscopy and biopsy revealed benign hepatic parenchyma with assoicated fibrosis with no evidence of malignancy.      10/06/2016 - 10/20/2016 Chemotherapy    Gemcitabine and Abraxane weekly, on day 1, 8, and 15 every 28 days, starting on 10/06/2016, held after1 cycle due to pending surgery at [facility]      10/31/2016 Imaging    CT CAP w contrast at [facility] 10/31/16 Impression: 1.Interval decrease in the size of the ill-defined pancreatic head mass. There is short segment abutment of the portal vein/superior mesenteric vein but no evidence of venous distortion. 2.No evidence of metastatic disease in the chest, abdomen and pelvis.      11/17/2016 -  Hospital Admission    Patient presents to hospital for nausea and vomitting      11/17/2016 Surgery    pancreatecomy, promximal subtotal with total  duodenectomy, partial  GASTRECTOMY, CHOLEDOCHOENTEROSTOMY AND GASTROJEJUNOSTOMY (WHIPPLE-TYPE PROCEDURE); WITH PANCREATOJEJUNOSTOMY       HISTORY OF PRESENTING ILLNESS:  Colton Bush 61 y.o. male is here because of His newly diagnosed pancreatic cancer. He is accompanied by his wife to our multidisciplinary GI clinic today.  He noticed fatigue, anorexia and weight loss about 50bs in the past 4 months. It started when his endocrinologist changed his diabetic meds. He presented to hospital on  7/30 with jaundice. No pain or nausea. CT scan revealed a heterogeneous pancreatic head mass, highly suspicious for malignancy. Mild biliary and pancreatic duct dilatation, mildly prominent lymph nodes in the upper abdomen. He underwent ERCP and CBD stent placement, and subsequent upper EUS on 03/09/2016, which showed a 3.5 cm irregular mass in the pancreatic head, the mass involves the portal vein, no suspicious nearby adenopathy.  He has been feeling better after stent placement, with improved appetite and energy level. He feels normal again. No symptoms. BM is good, he has gained about 8lbs back.   CURRENT THERAPY: Post surgery INTERIM HISTORY: Colton Bush returns for follow up accompanied by his wife.  He has had surgery on 4/20 and It went well. He reports to be doing fine post surgery. His incision pain is no more than 1/10 on scale.  He stayed in hospital the following Wednesday after surgery. His appetite is improving, but can't eat as much. He tries to be active by walking around the house.    MEDICAL HISTORY:  Past Medical History:  Diagnosis Date  . Arthritis    left hand  . Bronchitis 1977  . Cancer ([place]) 03/09/2016   pancreatic cancer  . Depression    takes Cymbalta daily  . Diabetes mellitus type II    sees [name]   . GERD (gastroesophageal reflux disease)    takes Omeprazole daily  . H/O hiatal hernia   . Hyperlipidemia    takes Zocor daily  . Hypertension    takes Amlodipine daily  . Neck pain    C4-7 stenosis and herniated disc  . Neuromuscular disorder ([place])    hiatal hernia  . Scoliosis    slight  . Spinal cord injury, C5-C7 ([place])    c4-c7  . Stiffness of hand joint    d/t cervical issues    SURGICAL HISTORY: Past Surgical History:  Procedure Laterality Date  . ANTERIOR CERVICAL DECOMP/DISCECTOMY FUSION  08/18/2011   Procedure: ANTERIOR CERVICAL DECOMPRESSION/DISCECTOMY FUSION 3 LEVELS;  Surgeon: Winfield Cunas, MD;  Location: [facility]URO ORS;  Service:  Neurosurgery;  Laterality: N/A;  Anterior Cervical Four-Five/Five-Six/Six-Seven Decompression with Fusion, Plating, and Bonegraft  . CARPAL TUNNEL RELEASE  2013   bilateral, per Dr. Christella Noa   . COLONOSCOPY  10-30-14   per Dr. Olevia Perches, clear, repeat in 10 yrs   . egd with esophageal dilation  9-08   per Dr. Olevia Perches  . ERCP N/A 03/01/2016   Procedure: ENDOSCOPIC RETROGRADE CHOLANGIOPANCREATOGRAPHY (ERCP) with brushings and stent;  Surgeon: Doran Stabler, MD;  Location: [facility] ENDOSCOPY;  Service: Endoscopy;  Laterality: N/A;  . EUS N/A 03/09/2016   Procedure: ESOPHAGEAL ENDOSCOPIC ULTRASOUND (EUS) RADIAL;  Surgeon: Milus Banister, MD;  Location: [facility] ENDOSCOPY;  Service: Endoscopy;  Laterality: N/A;  . lymph nodes biopsy    . melanoma rt calf  1999  . PORTACATH PLACEMENT Left 03/22/2016   Procedure: INSERTION PORT-A-CATH;  Surgeon: Stark Klein, MD;  Location: [facility] ORS;  Service: General;  Laterality: Left;  . SPINE  SURGERY    . TONSILLECTOMY     as a child  . ULNAR TUNNEL RELEASE  2013   right arm, per Dr. Christella Noa   . UPPER GASTROINTESTINAL ENDOSCOPY    . WHIPPLE PROCEDURE N/A 09/19/2016   Procedure: DIAGNOSTIC LAPAROSCOPY, LAPAROSCOPIC LIVER BIOPSY, RETROPERITONEAL EXPLORATION, INTRAOPERATIVE ULTRASOUND;  Surgeon: Stark Klein, MD;  Location: [place] OR;  Service: General;  Laterality: N/A;    SOCIAL HISTORY: Social History   Social History  . Marital status: Married    Spouse name: [name]  . Number of children: N/A  . Years of education: N/A   Occupational History  . Driver    Social History Main Topics  . Smoking status: Never Smoker  . Smokeless tobacco: Never Used     Comment: tried a pipe 35 years ago   . Alcohol use No  . Drug use: No  . Sexual activity: Yes   Other Topics Concern  . Not on file   Social History Narrative   Married, wife Rosaria Ferries Nutritional therapist-   He works as a Geophysicist/field seismologist for a company  He has one son 39 yo. Lives with his wife   FAMILY HISTORY: Family  History  Problem Relation Age of Onset  . Heart disease Father   . Heart disease Brother 68  . Anesthesia problems Mother   . Heart disease Mother   . Dementia Mother   . Diabetes Sister   . Stroke Sister   . Colon cancer Neg Hx   . Rectal cancer Neg Hx   . Stomach cancer Neg Hx     ALLERGIES:  is allergic to no known allergies.  MEDICATIONS:  Current Outpatient Prescriptions  Medication Sig Dispense Refill  . amLODipine (NORVASC) 10 MG tablet TAKE 1 TABLET BY MOUTH EVERY DAY 30 tablet 1  . benazepril (LOTENSIN) 10 MG tablet TAKE 1 TABLET (10 MG TOTAL) BY MOUTH DAILY. 30 tablet 2  . FREESTYLE LITE test strip USE ONE STRIP TO CHECK GLUCOSE ONCE DAILY. PLEASE  SCHEDULE FOLLOW UP 50 each 0  . insulin aspart (NOVOLOG) 100 UNIT/ML injection Give every morning with breakfast and every evening with supper per the following sliding scale: give 3 units for glucose 150-175, give 6 units for glucose 176-200, give 9 units for glucose 201-250, give 12 units for glucose greater than 250 3 vial 11  . Insulin Detemir (LEVEMIR) 100 UNIT/ML Pen Inject 15 Units into the skin 2 (two) times daily. 90 mL 3  . Insulin Pen Needle 30G X 5 MM MISC Use one daily with insulin 100 each 2  . JARDIANCE 25 MG TABS tablet TAKE 1 TABLET BY MOUTH EVERY DAY 90 tablet 1  . Lancets (FREESTYLE) lancets USE AS INSTRUCTED TO CHECK BLOOD SUGAR ONCE A DAY 100 each 2  . lidocaine-prilocaine (EMLA) cream Apply to affected area once 30 g 3  . loratadine (CLARITIN) 10 MG tablet Take 10 mg by mouth daily.    Marland Kitchen LORazepam (ATIVAN) 0.5 MG tablet Take 1 tablet (0.5 mg total) by mouth every 6 (six) hours as needed (Nausea or vomiting). 30 tablet 0  . magnesium oxide (MAG-OX) 400 (241.3 Mg) MG tablet Take 1 tablet (400 mg total) by mouth daily. 90 tablet 1  . Multiple Vitamin (MULTIVITAMIN WITH MINERALS) TABS tablet Take 1 tablet by mouth daily.    . naproxen sodium (ANAPROX) 220 MG tablet Take 440 mg by mouth 2 (two) times daily as  needed (for pain.).    Marland Kitchen  omeprazole (PRILOSEC) 20 MG capsule TAKE ONE CAPSULE BY MOUTH ONCE DAILY 90 capsule 3  . ondansetron (ZOFRAN) 8 MG tablet Take 1 tablet (8 mg total) by mouth 2 (two) times daily as needed (Nausea or vomiting). 30 tablet 1  . potassium chloride SA (KLOR-CON M20) 20 MEQ tablet Take 1 tablet (20 mEq total) by mouth daily. 30 tablet 1  . prochlorperazine (COMPAZINE) 10 MG tablet Take 1 tablet (10 mg total) by mouth every 6 (six) hours as needed (Nausea or vomiting). 30 tablet 1  . Omega-3 Fatty Acids (FISH OIL) 1200 MG CAPS Take 2,400 mg by mouth daily.    Marland Kitchen oxyCODONE (OXY IR/ROXICODONE) 5 MG immediate release tablet Take 1-2 tablets (5-10 mg total) by mouth every 4 (four) hours as needed for moderate pain, severe pain or breakthrough pain. (Patient not taking: Reported on 12/08/2016) 40 tablet 0  . traMADol (ULTRAM) 50 MG tablet Take 50 mg by mouth every 6 (six) hours as needed.     No current facility-administered medications for this visit.     REVIEW OF SYSTEMS:  Constitutional: Denies fevers, chills or abnormal night sweats  Eyes: Denies blurriness of vision, double vision or watery eyes Ears, nose, mouth, throat, and face: Denies mucositis or sore throat Respiratory: Denies cough, dyspnea or wheezes Cardiovascular: Denies palpitation, chest discomfort or lower extremity swelling Gastrointestinal:  Denies nausea, heartburn or change in bowel habits Skin: Denies abnormal skin rashes Lymphatics: Denies new lymphadenopathy or easy bruising Neurological:Denies numbness, tingling or new weaknesses Behavioral/Psych: Mood is stable, no new changes  All other systems were reviewed with the patient and are negative.  PHYSICAL EXAMINATION:  ECOG PERFORMANCE STATUS: 1  Vitals:   12/08/16 1405  BP: 118/69  Pulse: 87  Resp: 18  Temp: 98.5 F (36.9 C)   Filed Weights   12/08/16 1405  Weight: 183 lb 12.8 oz (83.4 kg)    GENERAL:alert, no distress and comfortable.    SKIN: skin color, texture, turgor are normal, no rashes or significant lesions EYES: normal, conjunctiva are pink and non-injected, sclera clear OROPHARYNX:no exudate, no erythema and lips, buccal mucosa, and tongue normal  NECK: supple, thyroid normal size, non-tender, without nodularity LYMPH:  no palpable lymphadenopathy in the cervical, axillary or inguinal LUNGS: clear to auscultation and percussion with normal breathing effort HEART: regular rate & rhythm and no murmurs and no lower extremity edema ABDOMEN:abdomen soft, non-tender and normal bowel sounds, midline surgical scar has healed well, no dicharge  Musculoskeletal:no cyanosis of digits and no clubbing  PSYCH: alert & oriented x 3 with fluent speech NEURO: no focal motor/sensory deficits  LABORATORY DATA:  I have reviewed the data as listed CBC Latest Ref Rng & Units 12/08/2016 10/20/2016 10/13/2016  WBC 4.0 - 10.3 10e3/uL 8.0 3.9(L) 6.7  Hemoglobin 13.0 - 17.1 g/dL 11.4(L) 10.7(L) 10.7(L)  Hematocrit 38.4 - 49.9 % 35.1(L) 32.1(L) 31.9(L)  Platelets 140 - 400 10e3/uL 404(H) 193 138(L)   CMP Latest Ref Rng & Units 12/08/2016 10/20/2016 10/13/2016  Glucose 70 - 140 mg/dl 314(H) 133 169(H)  BUN 7.0 - 26.0 mg/dL 15.6 17.7 12.0  Creatinine 0.7 - 1.3 mg/dL 1.0 0.7 0.5(L)  Sodium 136 - 145 mEq/L 140 139 142  Potassium 3.5 - 5.1 mEq/L 4.4 4.0 2.2(LL)  Chloride 101 - 111 mmol/L - - -  CO2 22 - 29 mEq/L 28 28 19(L)  Calcium 8.4 - 10.4 mg/dL 9.4 9.6 6.1(LL)  Total Protein 6.4 - 8.3 g/dL 7.8 7.6 4.5(L)  Total Bilirubin  0.20 - 1.20 mg/dL 0.35 0.45 0.90  Alkaline Phos 40 - 150 U/L 191(H) 349(H) 425(H)  AST 5 - 34 U/L 14 30 47(H)  ALT 0 - 55 U/L 17 45 90(H)      CA19.9 (0-35 U/ML)  02/28/2016: 1497 03/24/2016: 648 04/25/2016: 599 05/24/2016: 368 07/18/2016: 51 08/28/16: 55 10/06/16: 133 12/08/2016: 36   PATHOLOGY REPORT   Surgical Pathology 11/17/16 Dr. Mariah Milling  A. Head of pancreas, stomach, and duodenum, pancreatoduodenectomy  (Whipple procedure) post oncologic treatment:  - Invasive moderately differentiated ductal adenocarcinoma (2.7 cm). - Perineural invasion is seen. - Twenty lymph nodes, negative for tumor (0/20). - Very minimal treatment response, if any. - Margins of resection are negative for tumor (2.5 mm to uncinate margin, 3 mm to bile duct and pancreatic neck margins).   B. Gallbladder and contents, cholecystectomy:  Chronic cholecystitis with acute mucosal inflammation and reactive atypia.   Synoptic Report PANCREAS (EXOCRINE)(Pancreas Exo - A)     Liver, biopsy 09/19/16 - BENIGN HEPATIC PARENCHYMA WITH ASSOCIATED FIBROSIS. - THERE IS NO EVIDENCE OF MALIGNANCY.  Diagnosis 03/09/2016 FINE NEEDLE ASPIRATION, ENDOSCOPIC, PANCREAS (SPECIMEN 1 OF 1 COLLECTED 03/09/16): MALIGNANT CELLS CONSISTENT WITH ADENOCARCINOMA. BACKGROUND NECROTIC DEBRIS AND ACUTE INFLAMMATION.  Diagnosis 03/01/2016 BILE DUCT BRUSHING(SPECIMEN 1 OF 1 COLLECTED 03/01/16): RARE ATYPICAL CELLS, SEE COMMENT.  RADIOGRAPHIC STUDIES: I have personally reviewed the radiological images as listed and agreed with the findings in the report. No results found.   CT CAP W WO CONTRAST 09/04/16 IMPRESSION: 1. Poorly defined pancreatic head mass is grossly stable, with associated pancreatic ductal dilatation and portacaval adenopathy. No associated vascular encasement. 2. Common bile duct stent in place with stable biliary ductal dilatation. Left hepatic lobe atrophy. 3. Hepatomegaly.  Spleen is at the upper limits normal in size. 4. Aortic atherosclerosis (ICD10-170.0). Coronary artery calcification.  DG CEST PORT 1 09/19/16 IMPRESSION: 1. Lines and tubes noted as above. 2. Mild bibasilar subsegmental atelectasis. 3. Cardiomegaly, no pulmonary venous congestion. 4. Free intraperitoneal air consistent with prior surgery.  EUS 03/09/2016 Dr. Ardis Hughs  - 3.5cm irregularly shaped, poorly defined mass in the pancreatic head. There is  suspicious, nearby adenopathy. The mass is causing pancreatic and bililary duct obstruction (previously stented). The mass involves the portal vein for 17mm (abuttment and loss of usual tissue interface), strongly suggesting invasion. Preliminary cytology results are positive for malignancy (adenocarcinoma), await final report.  ASSESSMENT & PLAN: 61 y.o. Caucasian male, with past medical history of diabetes and hypertension, presented with epigastric pain, weight loss, and jaundice.  1. Primary pancreatic adenocarcinoma, in pancreatic head, cT2N1M0, stage IIB, ypT2N0M00  -I have previously reviewed his CT abdomen and pelvis with contrast, EUS and biopsy findings with patient and his wife in great details. -His case was previously reviewed in our GI tumor board a few days ago. CT scan and US showed possible portal vein invasion from the pancreatic tumor, this is borderline resectable disease. No evidence of distant metastasis.  -We previously reviewed the nature history of pancreatic cancer, and the overall survival rate with chemotherapy, surgery, and radiation. Patient and his wife was discouraged by the overall dismal long term survival rate (~20%)  -He received neoadjuvant chemotherapy with FOLFIRINOX and neoadjuvant chemoradiation, tolerated well  -He previously underwent diagnostic laparoscopy and possible whipple procedure on 09/19/2016 by Dr. Barry Dienes to attempt removal of the pancreatic tumor. The tumor appeared to be encasing the SMV/portal vein confluence with blurring of the interface. The tumor also appeared to be invading the superior mesenteric  artery on the right lateral surface. This point, the procedure was aborted. -Dr. Mariah Milling at [facility] offered Whipple surgery on 11/17/16. -I reviewed his surgical pathology findings, which showed a T2 tumor, surgical margins were negative, all lymph nodes were negative. - He is recovering well after surgery. I encouraged him to try to eat more, try to  gain a little weight and be active.  - I again discussed the role  Adjuvant chemotherapy. He received about 4-1/2 months of neoadjuvant chemotherapy, I recommend an additional 6 weeks of gemcitabine.Once a week with 2 weeks on, 1 week off, for 2 cycles  -The goal of care is curative. -plan to start adjuvant chemo in 3 weeks  - port flush in 3 weeks - repeat scan in 4-6 months   2. Type 2 DM - he will continue medication and follow up with his primary care physician Dr. Sarajane Jews  -We previously reviewed that his blood glucose will need to be monitored closely during the chemotherapy, and his medication may need to be adjusted  -His blood glucose has been fluctuating lately, he sees a diabetic specialist and now, and checking his finger glucose 3 times a day, better controlled lately. -  I advised the patient to continue watching his blood glucose levels. - I recommended Glucerna as a nutritional supplement due to lower amount of carbohydrate  3. HTN -He is on amlodipine and benazepril  -His blood pressure is normal today. -The previously discussed chemotherapy may affect his blood pressure, and he is medication may need to be adjusted  5. Goal of care discussion  -We previously discussed the incurable nature of his cancer if it remains unresectable after more chemo, and the overall poor prognosis, especially if he does not have good response to chemotherapy or progress on chemo -He now has had complete surgical resection -The goal of care is curative.    Plan -  lab, flush and gemicitabin 3-4 weeks - f/u in 3 weeks  All questions were answered. The patient knows to call the clinic with any problems, questions or concerns.  I spent 25 minutes counseling the patient face to face. The total time spent in the appointment was 30 minutes and more than 50% was on counseling.    Truitt Merle, MD 12/08/2016   This document serves as a record of services personally performed by Truitt Merle, MD. It was  created on her behalf by Brandt Loosen, a trained medical scribe. The creation of this record is based on the scribe's personal observations and the provider's statements to them. This document has been checked and approved by the attending provider.

## 2016-12-08 ENCOUNTER — Other Ambulatory Visit (HOSPITAL_BASED_OUTPATIENT_CLINIC_OR_DEPARTMENT_OTHER): Payer: 59

## 2016-12-08 ENCOUNTER — Ambulatory Visit (HOSPITAL_BASED_OUTPATIENT_CLINIC_OR_DEPARTMENT_OTHER): Payer: 59 | Admitting: Hematology

## 2016-12-08 ENCOUNTER — Telehealth: Payer: Self-pay | Admitting: Hematology

## 2016-12-08 VITALS — BP 118/69 | HR 87 | Temp 98.5°F | Resp 18 | Ht 70.0 in | Wt 183.8 lb

## 2016-12-08 DIAGNOSIS — IMO0001 Reserved for inherently not codable concepts without codable children: Secondary | ICD-10-CM

## 2016-12-08 DIAGNOSIS — E1165 Type 2 diabetes mellitus with hyperglycemia: Secondary | ICD-10-CM | POA: Diagnosis not present

## 2016-12-08 DIAGNOSIS — Z794 Long term (current) use of insulin: Secondary | ICD-10-CM | POA: Diagnosis not present

## 2016-12-08 DIAGNOSIS — C250 Malignant neoplasm of head of pancreas: Secondary | ICD-10-CM

## 2016-12-08 DIAGNOSIS — I10 Essential (primary) hypertension: Secondary | ICD-10-CM | POA: Diagnosis not present

## 2016-12-08 LAB — CBC WITH DIFFERENTIAL/PLATELET
BASO%: 1 % (ref 0.0–2.0)
Basophils Absolute: 0.1 10*3/uL (ref 0.0–0.1)
EOS%: 3 % (ref 0.0–7.0)
Eosinophils Absolute: 0.2 10*3/uL (ref 0.0–0.5)
HCT: 35.1 % — ABNORMAL LOW (ref 38.4–49.9)
HGB: 11.4 g/dL — ABNORMAL LOW (ref 13.0–17.1)
LYMPH%: 7.1 % — ABNORMAL LOW (ref 14.0–49.0)
MCH: 28.3 pg (ref 27.2–33.4)
MCHC: 32.6 g/dL (ref 32.0–36.0)
MCV: 86.7 fL (ref 79.3–98.0)
MONO#: 0.6 10*3/uL (ref 0.1–0.9)
MONO%: 7.3 % (ref 0.0–14.0)
NEUT#: 6.5 10*3/uL (ref 1.5–6.5)
NEUT%: 81.6 % — ABNORMAL HIGH (ref 39.0–75.0)
Platelets: 404 10*3/uL — ABNORMAL HIGH (ref 140–400)
RBC: 4.04 10*6/uL — ABNORMAL LOW (ref 4.20–5.82)
RDW: 15 % — ABNORMAL HIGH (ref 11.0–14.6)
WBC: 8 10*3/uL (ref 4.0–10.3)
lymph#: 0.6 10*3/uL — ABNORMAL LOW (ref 0.9–3.3)

## 2016-12-08 LAB — COMPREHENSIVE METABOLIC PANEL
ALT: 17 U/L (ref 0–55)
AST: 14 U/L (ref 5–34)
Albumin: 3.5 g/dL (ref 3.5–5.0)
Alkaline Phosphatase: 191 U/L — ABNORMAL HIGH (ref 40–150)
Anion Gap: 10 mEq/L (ref 3–11)
BUN: 15.6 mg/dL (ref 7.0–26.0)
CO2: 28 mEq/L (ref 22–29)
Calcium: 9.4 mg/dL (ref 8.4–10.4)
Chloride: 102 mEq/L (ref 98–109)
Creatinine: 1 mg/dL (ref 0.7–1.3)
EGFR: 83 mL/min/{1.73_m2} — ABNORMAL LOW (ref >90)
Glucose: 314 mg/dl — ABNORMAL HIGH (ref 70–140)
Potassium: 4.4 mEq/L (ref 3.5–5.1)
Sodium: 140 mEq/L (ref 136–145)
Total Bilirubin: 0.35 mg/dL (ref 0.20–1.20)
Total Protein: 7.8 g/dL (ref 6.4–8.3)

## 2016-12-08 NOTE — Telephone Encounter (Signed)
Appointments scheduled per 5.11.18 LOS. Patient given AVS report and calendars with future scheduled appointments. °

## 2016-12-09 LAB — CANCER ANTIGEN 19-9: CA 19-9: 36 U/mL — ABNORMAL HIGH (ref 0–35)

## 2016-12-10 ENCOUNTER — Encounter: Payer: Self-pay | Admitting: Hematology

## 2016-12-26 ENCOUNTER — Telehealth: Payer: Self-pay | Admitting: *Deleted

## 2016-12-26 NOTE — Telephone Encounter (Signed)
Scheduling message sent. 

## 2016-12-26 NOTE — Telephone Encounter (Signed)
That's sounds reasonable, please reschedule, thanks   Truitt Merle MD

## 2016-12-26 NOTE — Telephone Encounter (Signed)
"  I need to reschedule all three appointments on 12-29-2016 to 01-12-2017.  The Nurse Practitioner at [facility] told me I need two more weeks to heal from the Whipple procedure before I begin chemotherapy.  I will be seen at [facility] again on 01-05-2017. Return number [phone]."  Will notify provider to send scheduling message.

## 2016-12-27 ENCOUNTER — Telehealth: Payer: Self-pay | Admitting: *Deleted

## 2016-12-27 NOTE — Telephone Encounter (Signed)
Pt called requesting changed of appts for Friday 01/05/17.  Stated has appt at [facility] on same day 01/05/17 ;  Pt would like to move appt to a week later. Noted that pt already has appts for lab, office visit with Dr. [name], and chemo on  01/12/17. Pt's   Phone     [phone].

## 2016-12-29 ENCOUNTER — Other Ambulatory Visit: Payer: Self-pay

## 2016-12-29 ENCOUNTER — Ambulatory Visit: Payer: Self-pay

## 2016-12-29 ENCOUNTER — Ambulatory Visit: Payer: Self-pay | Admitting: Hematology

## 2017-01-05 ENCOUNTER — Ambulatory Visit: Payer: Self-pay

## 2017-01-05 ENCOUNTER — Other Ambulatory Visit: Payer: Self-pay

## 2017-01-08 ENCOUNTER — Telehealth: Payer: Self-pay | Admitting: *Deleted

## 2017-01-08 NOTE — Telephone Encounter (Addendum)
Received vm call from pt stating that he would like to postpone his treatment to 01/19/17 due to appt at [facility] this Fri 6/15.  Call back # is [phone] or [phone].  Message to Dr [name].  Informed pt to call us back after [facility] appt to let us know what the plan is & hopefully the PA/MD will touch base with Dr [name].

## 2017-01-10 ENCOUNTER — Telehealth: Payer: Self-pay | Admitting: Hematology

## 2017-01-10 NOTE — Telephone Encounter (Signed)
sw pt to confirm r/s 6/15 appts to 6/22. msg sent to MD re next office visit

## 2017-01-12 ENCOUNTER — Ambulatory Visit: Payer: Self-pay | Admitting: Hematology

## 2017-01-12 ENCOUNTER — Ambulatory Visit: Payer: Self-pay

## 2017-01-12 ENCOUNTER — Other Ambulatory Visit: Payer: Self-pay

## 2017-01-17 ENCOUNTER — Telehealth: Payer: Self-pay | Admitting: *Deleted

## 2017-01-17 NOTE — Telephone Encounter (Signed)
Call from pt reporting NP at [facility] says it is OK to resume his chemo. Noted he is scheduled for treatment 6/22 and 6/29. Next office visit 7/13.

## 2017-01-18 ENCOUNTER — Other Ambulatory Visit: Payer: Self-pay | Admitting: Nurse Practitioner

## 2017-01-18 NOTE — Telephone Encounter (Signed)
Lattie Haw, could you see him before his chemo on 6/22? If now, please see him on 6/29. Thanks   Truitt Merle MD

## 2017-01-19 ENCOUNTER — Ambulatory Visit (HOSPITAL_BASED_OUTPATIENT_CLINIC_OR_DEPARTMENT_OTHER): Payer: 59 | Admitting: Hematology and Oncology

## 2017-01-19 ENCOUNTER — Ambulatory Visit: Payer: 59

## 2017-01-19 ENCOUNTER — Ambulatory Visit (HOSPITAL_BASED_OUTPATIENT_CLINIC_OR_DEPARTMENT_OTHER): Payer: 59

## 2017-01-19 ENCOUNTER — Other Ambulatory Visit (HOSPITAL_BASED_OUTPATIENT_CLINIC_OR_DEPARTMENT_OTHER): Payer: 59

## 2017-01-19 ENCOUNTER — Telehealth: Payer: Self-pay | Admitting: Hematology and Oncology

## 2017-01-19 VITALS — BP 119/75 | HR 82 | Temp 97.9°F | Resp 17 | Ht 70.0 in | Wt 185.8 lb

## 2017-01-19 DIAGNOSIS — C250 Malignant neoplasm of head of pancreas: Secondary | ICD-10-CM

## 2017-01-19 DIAGNOSIS — Z95828 Presence of other vascular implants and grafts: Secondary | ICD-10-CM

## 2017-01-19 DIAGNOSIS — Z5111 Encounter for antineoplastic chemotherapy: Secondary | ICD-10-CM

## 2017-01-19 LAB — COMPREHENSIVE METABOLIC PANEL
ALT: 26 U/L (ref 0–55)
AST: 18 U/L (ref 5–34)
Albumin: 3.5 g/dL (ref 3.5–5.0)
Alkaline Phosphatase: 119 U/L (ref 40–150)
Anion Gap: 10 mEq/L (ref 3–11)
BUN: 17.6 mg/dL (ref 7.0–26.0)
CO2: 28 mEq/L (ref 22–29)
Calcium: 10 mg/dL (ref 8.4–10.4)
Chloride: 102 mEq/L (ref 98–109)
Creatinine: 0.7 mg/dL (ref 0.7–1.3)
EGFR: >90 mL/min/{1.73_m2} (ref >90)
Glucose: 150 mg/dl — ABNORMAL HIGH (ref 70–140)
Potassium: 4 mEq/L (ref 3.5–5.1)
Sodium: 140 mEq/L (ref 136–145)
Total Bilirubin: 0.34 mg/dL (ref 0.20–1.20)
Total Protein: 7.8 g/dL (ref 6.4–8.3)

## 2017-01-19 LAB — CBC WITH DIFFERENTIAL/PLATELET
BASO%: 0.4 % (ref 0.0–2.0)
Basophils Absolute: 0 10*3/uL (ref 0.0–0.1)
EOS%: 3.7 % (ref 0.0–7.0)
Eosinophils Absolute: 0.3 10*3/uL (ref 0.0–0.5)
HCT: 34 % — ABNORMAL LOW (ref 38.4–49.9)
HGB: 10.9 g/dL — ABNORMAL LOW (ref 13.0–17.1)
LYMPH%: 9.9 % — ABNORMAL LOW (ref 14.0–49.0)
MCH: 27.4 pg (ref 27.2–33.4)
MCHC: 32.1 g/dL (ref 32.0–36.0)
MCV: 85.4 fL (ref 79.3–98.0)
MONO#: 0.7 10*3/uL (ref 0.1–0.9)
MONO%: 10.2 % (ref 0.0–14.0)
NEUT#: 5.3 10*3/uL (ref 1.5–6.5)
NEUT%: 75.8 % — ABNORMAL HIGH (ref 39.0–75.0)
Platelets: 292 10*3/uL (ref 140–400)
RBC: 3.98 10*6/uL — ABNORMAL LOW (ref 4.20–5.82)
RDW: 14.2 % (ref 11.0–14.6)
WBC: 7 10*3/uL (ref 4.0–10.3)
lymph#: 0.7 10*3/uL — ABNORMAL LOW (ref 0.9–3.3)

## 2017-01-19 MED ORDER — HEPARIN SOD (PORK) LOCK FLUSH 100 UNIT/ML IV SOLN
500.0000 [IU] | Freq: Once | INTRAVENOUS | Status: AC | PRN
  Administered 2017-01-19: 500 [IU]
  Filled 2017-01-19: qty 5

## 2017-01-19 MED ORDER — SODIUM CHLORIDE 0.9 % IV SOLN
2000.0000 mg | Freq: Once | INTRAVENOUS | Status: AC
  Administered 2017-01-19: 2000 mg via INTRAVENOUS
  Filled 2017-01-19: qty 52.6

## 2017-01-19 MED ORDER — SODIUM CHLORIDE 0.9 % IJ SOLN
10.0000 mL | INTRAMUSCULAR | Status: DC | PRN
  Administered 2017-01-19: 10 mL via INTRAVENOUS
  Filled 2017-01-19: qty 10

## 2017-01-19 MED ORDER — SODIUM CHLORIDE 0.9% FLUSH
10.0000 mL | INTRAVENOUS | Status: DC | PRN
  Administered 2017-01-19: 10 mL
  Filled 2017-01-19: qty 10

## 2017-01-19 MED ORDER — PROCHLORPERAZINE MALEATE 10 MG PO TABS
ORAL_TABLET | ORAL | Status: AC
  Filled 2017-01-19: qty 1

## 2017-01-19 MED ORDER — PROCHLORPERAZINE MALEATE 10 MG PO TABS
10.0000 mg | ORAL_TABLET | Freq: Once | ORAL | Status: AC
  Administered 2017-01-19: 10 mg via ORAL

## 2017-01-19 MED ORDER — SODIUM CHLORIDE 0.9 % IV SOLN
Freq: Once | INTRAVENOUS | Status: AC
  Administered 2017-01-19: 10:00:00 via INTRAVENOUS

## 2017-01-19 NOTE — Telephone Encounter (Signed)
NO additional appts added - appts already scheduled per 6/22 los.

## 2017-01-19 NOTE — Patient Instructions (Signed)
[facility] Discharge Instructions for Patients Receiving Chemotherapy  Today you received the following chemotherapy agents Gemzar  To help prevent nausea and vomiting after your treatment, we encourage you to take your nausea medication as directed.    If you develop nausea and vomiting that is not controlled by your nausea medication, call the clinic.   BELOW ARE SYMPTOMS THAT SHOULD BE REPORTED IMMEDIATELY:  *FEVER GREATER THAN 100.5 F  *CHILLS WITH OR WITHOUT FEVER  NAUSEA AND VOMITING THAT IS NOT CONTROLLED WITH YOUR NAUSEA MEDICATION  *UNUSUAL SHORTNESS OF BREATH  *UNUSUAL BRUISING OR BLEEDING  TENDERNESS IN MOUTH AND THROAT WITH OR WITHOUT PRESENCE OF ULCERS  *URINARY PROBLEMS  *BOWEL PROBLEMS  UNUSUAL RASH Items with * indicate a potential emergency and should be followed up as soon as possible.  Feel free to call the clinic you have any questions or concerns. The clinic phone number is [phone].  Please show the CHEMO ALERT CARD at check-in to the Emergency Department and triage nurse.   

## 2017-01-19 NOTE — Progress Notes (Signed)
[place] [facility] Follow-up Visit:  Assessment: Adenocarcinoma of head of pancreas [facility]) 61 year old male with diagnosis of localized adenocarcinoma of the pancreatic head treated with neoadjuvant chemotherapy followed by definitive surgery. Complicated and delayed surgical recovery to slight delay in the initiation of systemic chemotherapy. At this time, patient has sufficiently healed after his surgery. His nutritional status is improving. Surgical incision is nearly totally healed with only a small area of granulation tissue and no evidence of dehiscence, fluid collection, or infection.  As per previous discussion, based on the initial stage of the malignancy, we did recommend patient to proceed with adjuvant systemic chemotherapy to minimize the chances of cancer recurrence postoperatively. Based on his clinical picture, single agent gemcitabine is the appropriate choice of treatment at this point in time.  Clinical evaluation and lab work up or Mrs. to proceed with initial dose of gemcitabine as scheduled.  --Proceed with Gemcitabine C1 d1 today; the patient will return area in 1 week for day 8 of administration of the chemotherapy. --On RTC in 3 weeks: Labs, clinic visit, possible second cycle of gemcitabine chemotherapy --Disease assessment by imaging and discussion of the primary treating oncologist  Voice recognition software was used and creation of this note. Despite my best effort at editing the text, some misspelling/errors may have occurred.    Orders Placed This Encounter  Procedures  . CBC with Differential    Standing Status:   Future    Standing Expiration Date:   01/19/2018  . Comprehensive metabolic panel    Standing Status:   Future    Standing Expiration Date:   01/19/2018  . CBC with Differential    Standing Status:   Future    Standing Expiration Date:   01/19/2018  . Comprehensive metabolic panel    Standing Status:   Future    Standing Expiration  Date:   01/19/2018  . CA 19.9    Standing Status:   Future    Standing Expiration Date:   01/19/2018  . Magnesium    Standing Status:   Future    Standing Expiration Date:   01/19/2018    Cancer Staging Adenocarcinoma of head of pancreas [facility]) Staging form: Pancreas, AJCC 7th Edition - Clinical stage from 03/09/2016: Stage IIB (T2, N1, M0) - Signed by Truitt Merle, MD on 03/17/2016 - Pathologic stage from 11/17/2016: Stage IB (yT2, N0, cM0) - Signed by Truitt Merle, MD on 12/10/2016   All questions were answered.  . The patient knows to call the clinic with any problems, questions or concerns.  This note was electronically signed.    History of Presenting Illness Colton Bush 61 y.o. presenting to the [place] for Diagnosis of adenocarcinoma of the head of the pancreas, currently status post neoadjuvant chemotherapy, surgery, and presenting for possible initiation of adjuvant systemic chemotherapy today. Patient usually follows Dr.Feng in the clinic and I am seeing Colton Bush in her absence.   Since the last visit in the clinic, patient had significant improvement and recovery of the postoperative difficulties. He has surgical incision is almost completely healed with the exception of a small area that has been exhibiting some small amount of drainage over the past several weeks. Patient denies redness or fluid collection around it, denies fever or chills, or night sweats. He denies any chest pain, shortness of breath, or cough. No nausea, vomiting, abdominal pain, diarrhea, or constipation.  Oncological/hematological History: Oncology History   Cancer Staging Adenocarcinoma of head of pancreas ([place])  Staging form: Pancreas, AJCC 7th Edition - Clinical stage from 03/09/2016: Stage IIB (T2, N1, M0) - Signed by Truitt Merle, MD on 03/17/2016 - Pathologic stage from 11/17/2016: Stage IB (yT2, N0, cM0) - Signed by Truitt Merle, MD on 12/10/2016       Adenocarcinoma of head of pancreas ([place])    02/27/2016 Imaging    CT chest, abdomen and pelvis with contrast showed ill-defined heterogeneity of pancreatic head, highly suspicious for malignancy, ) quit about and biliary ductal dilatation, mildly prominent lymph nodes in the upper abdomen, largest 2 cm in the portocaval . No other metastasis.       03/09/2016 Initial Diagnosis    Adenocarcinoma of head of pancreas ([place])      03/09/2016 Procedure    Upper EUS showed a 3.5 cm irregular mass in the pancreatic head, causing pancreatic and biliary duct obstruction. The mass involves the portal vein 422 mm, strongly suggesting invasion. There is suspicious nearby adenopathy      03/09/2016 Initial Biopsy    Fine-needle aspiration of the pancreatic mass from EUS showed malignant cells consistent with adenocarcinoma.       03/28/2016 - 05/24/2016 Chemotherapy    neoadjuvant chemo with FOLFIRINOX every 2 weeks, for 5 cycles       06/28/2016 - 08/10/2016 Radiation Therapy    Neoadjuvant radiation by Dr. [name] Site/dose:   Pancreas treated to 45 Gy in 25 fractions. The Pancreas was then boosted to 54 Gy in 5 fractions.      06/28/2016 - 08/10/2016 Chemotherapy    Xeloda 2000 mg in the morning and 1500 mg in the evening, on the day of radiation.      09/04/2016 Imaging    CT Chest Abdomen Pelvis IMPRESSION: 1. Poorly defined pancreatic head mass is grossly stable, with associated pancreatic ductal dilatation and portacaval adenopathy. No associated vascular encasement. 2. Common bile duct stent in place with stable biliary ductal dilatation. Left hepatic lobe atrophy. 3. Hepatomegaly.  Spleen is at the upper limits normal in size. 4. Aortic atherosclerosis (ICD10-170.0). Coronary artery calcification.      09/19/2016 Surgery    Diagnostic laparoscopy and possible whipple procedure by Dr. Barry Dienes. Whipple procedure aborted due to the pancreatic cancer involving SMV, SMA and portal veins.      09/19/2016 Pathology Results    Two liver  lesions were noted during diagnostic laparoscopy and biopsy revealed benign hepatic parenchyma with assoicated fibrosis with no evidence of malignancy.      10/06/2016 - 10/20/2016 Chemotherapy    Gemcitabine and Abraxane weekly, on day 1, 8, and 15 every 28 days, starting on 10/06/2016, held after1 cycle due to pending surgery at [facility]      10/31/2016 Imaging    CT CAP w contrast at [facility] 10/31/16 Impression: 1.Interval decrease in the size of the ill-defined pancreatic head mass. There is short segment abutment of the portal vein/superior mesenteric vein but no evidence of venous distortion. 2.No evidence of metastatic disease in the chest, abdomen and pelvis.      11/17/2016 -  Hospital Admission    Patient presents to hospital for nausea and vomitting      11/17/2016 Surgery    pancreatecomy, promximal subtotal with total duodenectomy, partial  GASTRECTOMY, CHOLEDOCHOENTEROSTOMY AND GASTROJEJUNOSTOMY (WHIPPLE-TYPE PROCEDURE); WITH PANCREATOJEJUNOSTOMY      01/19/2017 -  Chemotherapy    Gemcitabine monotherapy: 1063m/m2, d1,8 Q21d x2 cycles planned        Medical History: Past Medical History:  Diagnosis Date  .  Arthritis    left hand  . Bronchitis 1977  . Cancer ([place]) 03/09/2016   pancreatic cancer  . Depression    takes Cymbalta daily  . Diabetes mellitus type II    sees [name]   . GERD (gastroesophageal reflux disease)    takes Omeprazole daily  . H/O hiatal hernia   . Hyperlipidemia    takes Zocor daily  . Hypertension    takes Amlodipine daily  . Neck pain    C4-7 stenosis and herniated disc  . Neuromuscular disorder ([place])    hiatal hernia  . Scoliosis    slight  . Spinal cord injury, C5-C7 ([place])    c4-c7  . Stiffness of hand joint    d/t cervical issues    Surgical History: Past Surgical History:  Procedure Laterality Date  . ANTERIOR CERVICAL DECOMP/DISCECTOMY FUSION  08/18/2011   Procedure: ANTERIOR CERVICAL DECOMPRESSION/DISCECTOMY FUSION 3  LEVELS;  Surgeon: Winfield Cunas, MD;  Location: [place] [facility]UR[facility] ORS;  Service: Neurosurgery;  Laterality: N/A;  Anterior Cervical Four-Five/Five-Six/Six-Seven Decompression with Fusion, Plating, and Bonegraft  . CARPAL TUNNEL RELEASE  2013   bilateral, per Dr. Christella Noa   . COLONOSCOPY  10-30-14   per Dr. Olevia Perches, clear, repeat in 10 yrs   . egd with esophageal dilation  9-08   per Dr. Olevia Perches  . ERCP N/A 03/01/2016   Procedure: ENDOSCOPIC RETROGRADE CHOLANGIOPANCREATOGRAPHY (ERCP) with brushings and stent;  Surgeon: Doran Stabler, MD;  Location: [facility] END[facility];  Service: Endoscopy;  Laterality: N/A;  . EUS N/A 03/09/2016   Procedure: ESOPHAGEAL ENDOSCOPIC ULTRASOUND (EUS) RADIAL;  Surgeon: Milus Banister, MD;  Location: [facility];  Service: Endoscopy;  Laterality: N/A;  . lymph nodes biopsy    . melanoma rt calf  1999  . PORTACATH PLACEMENT Left 03/22/2016   Procedure: INSERTION PORT-A-CATH;  Surgeon: Stark Klein, MD;  Location: [facility] ORS;  Service: General;  Laterality: Left;  . SPINE SURGERY    . TONSILLECTOMY     as a child  . ULNAR TUNNEL RELEASE  2013   right arm, per Dr. Christella Noa   . UPPER GASTROINTESTINAL ENDOSCOPY    . WHIPPLE PROCEDURE N/A 09/19/2016   Procedure: DIAGNOSTIC LAPAROSCOPY, LAPAROSCOPIC LIVER BIOPSY, RETROPERITONEAL EXPLORATION, INTRAOPERATIVE ULTRASOUND;  Surgeon: Stark Klein, MD;  Location: [facility] OR;  Service: General;  Laterality: N/A;    Family History: Family History  Problem Relation Age of Onset  . Heart disease Father   . Heart disease Brother 80  . Anesthesia problems Mother   . Heart disease Mother   . Dementia Mother   . Diabetes Sister   . Stroke Sister   . Colon cancer Neg Hx   . Rectal cancer Neg Hx   . Stomach cancer Neg Hx     Social History: Social History   Social History  . Marital status: Married    Spouse name: [name]  . Number of children: N/A  . Years of education: N/A   Occupational History  . Driver    Social History Main Topics  .  Smoking status: Never Smoker  . Smokeless tobacco: Never Used     Comment: tried a pipe 35 years ago   . Alcohol use No  . Drug use: No  . Sexual activity: Yes   Other Topics Concern  . Not on file   Social History Narrative   Married, wife [name]   [employer]-    Allergies: Allergies  Allergen Reactions  . No Known  Allergies     Medications:  Current Outpatient Prescriptions  Medication Sig Dispense Refill  . amLODipine (NORVASC) 10 MG tablet TAKE 1 TABLET BY MOUTH EVERY DAY 30 tablet 1  . benazepril (LOTENSIN) 10 MG tablet TAKE 1 TABLET (10 MG TOTAL) BY MOUTH DAILY. 30 tablet 2  . FREESTYLE LITE test strip USE ONE STRIP TO CHECK GLUCOSE ONCE DAILY. PLEASE  SCHEDULE FOLLOW UP 50 each 0  . insulin aspart (NOVOLOG) 100 UNIT/ML injection Give every morning with breakfast and every evening with supper per the following sliding scale: give 3 units for glucose 150-175, give 6 units for glucose 176-200, give 9 units for glucose 201-250, give 12 units for glucose greater than 250 3 vial 11  . Insulin Detemir (LEVEMIR) 100 UNIT/ML Pen Inject 15 Units into the skin 2 (two) times daily. 90 mL 3  . Insulin Pen Needle 30G X 5 MM MISC Use one daily with insulin 100 each 2  . JARDIANCE 25 MG TABS tablet TAKE 1 TABLET BY MOUTH EVERY DAY 90 tablet 1  . Lancets (FREESTYLE) lancets USE AS INSTRUCTED TO CHECK BLOOD SUGAR ONCE A DAY 100 each 2  . lidocaine-prilocaine (EMLA) cream Apply to affected area once 30 g 3  . loratadine (CLARITIN) 10 MG tablet Take 10 mg by mouth daily.    Marland Kitchen LORazepam (ATIVAN) 0.5 MG tablet Take 1 tablet (0.5 mg total) by mouth every 6 (six) hours as needed (Nausea or vomiting). 30 tablet 0  . magnesium oxide (MAG-OX) 400 (241.3 Mg) MG tablet Take 1 tablet (400 mg total) by mouth daily. 90 tablet 1  . Multiple Vitamin (MULTIVITAMIN WITH MINERALS) TABS tablet Take 1 tablet by mouth daily.    . naproxen sodium (ANAPROX) 220 MG tablet Take 440 mg by mouth 2 (two) times  daily as needed (for pain.).    Marland Kitchen Omega-3 Fatty Acids (FISH OIL) 1200 MG CAPS Take 2,400 mg by mouth daily.    Marland Kitchen omeprazole (PRILOSEC) 20 MG capsule TAKE ONE CAPSULE BY MOUTH ONCE DAILY 90 capsule 3  . ondansetron (ZOFRAN) 8 MG tablet Take 1 tablet (8 mg total) by mouth 2 (two) times daily as needed (Nausea or vomiting). 30 tablet 1  . oxyCODONE (OXY IR/ROXICODONE) 5 MG immediate release tablet Take 1-2 tablets (5-10 mg total) by mouth every 4 (four) hours as needed for moderate pain, severe pain or breakthrough pain. (Patient not taking: Reported on 12/08/2016) 40 tablet 0  . potassium chloride SA (KLOR-CON M20) 20 MEQ tablet Take 1 tablet (20 mEq total) by mouth daily. 30 tablet 1  . prochlorperazine (COMPAZINE) 10 MG tablet Take 1 tablet (10 mg total) by mouth every 6 (six) hours as needed (Nausea or vomiting). 30 tablet 1  . traMADol (ULTRAM) 50 MG tablet Take 50 mg by mouth every 6 (six) hours as needed.     No current facility-administered medications for this visit.     Review of Systems: Review of Systems  All other systems reviewed and are negative.    PHYSICAL EXAMINATION Blood pressure 119/75, pulse 82, temperature 97.9 F (36.6 C), temperature source Oral, resp. rate 17, height _0  (1.778 m), weight 185 lb 12.8 oz (84.3 kg), SpO2 100 %.  ECOG PERFORMANCE STATUS: 1 - Symptomatic but completely ambulatory  Physical Exam  Constitutional: He is oriented to person, place, and time. Vital signs are normal. He appears not malnourished and not jaundiced. He appears healthy.  Non-toxic appearance. He does not have a sickly appearance. No  distress.  HENT:  Head: Atraumatic.  Mouth/Throat: Oropharynx is clear and moist and mucous membranes are normal. Mucous membranes are not pale, not dry and not cyanotic. No oropharyngeal exudate or posterior oropharyngeal erythema.  Eyes: Conjunctivae and EOM are normal. Pupils are equal, round, and reactive to light.  Cardiovascular: Normal rate,  regular rhythm, S1 normal, S2 normal, intact distal pulses and normal pulses.   Pulmonary/Chest: Effort normal and breath sounds normal. He has no decreased breath sounds. He has no wheezes. He has no rales.  Abdominal:  Abdomen that appears to be minimally distended, symmetric. Midline surgical incision is healing well with the exception of the presence of a 0.4 cm of persistent granulation tissue right superior to the umbilicus. From this discharge is present in the dressing, no significant fluid collection, erythema, or purulent drainage identified.  Abdomen is nontender, no hepatosplenomegaly appreciated by percussion. Good bowel sounds.  Lymphadenopathy:       Head (right side): No submandibular and no occipital adenopathy present.       Head (left side): No submandibular and no occipital adenopathy present.    He has no cervical adenopathy.    He has no axillary adenopathy.       Right: No inguinal adenopathy present.       Left: No inguinal adenopathy present.  Neurological: He is alert and oriented to person, place, and time. Gait normal.  Skin: Skin is warm, dry and intact.     LABORATORY DATA: I have personally reviewed the data as listed: Appointment on 01/19/2017  Component Date Value Ref Range Status  . WBC 01/19/2017 7.0  4.0 - 10.3 10e3/uL Final  . NEUT# 01/19/2017 5.3  1.5 - 6.5 10e3/uL Final  . HGB 01/19/2017 10.9* 13.0 - 17.1 g/dL Final  . HCT 01/19/2017 34.0* 38.4 - 49.9 % Final  . Platelets 01/19/2017 292  140 - 400 10e3/uL Final  . MCV 01/19/2017 85.4  79.3 - 98.0 fL Final  . MCH 01/19/2017 27.4  27.2 - 33.4 pg Final  . MCHC 01/19/2017 32.1  32.0 - 36.0 g/dL Final  . RBC 01/19/2017 3.98* 4.20 - 5.82 10e6/uL Final  . RDW 01/19/2017 14.2  11.0 - 14.6 % Final  . lymph# 01/19/2017 0.7* 0.9 - 3.3 10e3/uL Final  . MONO# 01/19/2017 0.7  0.1 - 0.9 10e3/uL Final  . Eosinophils Absolute 01/19/2017 0.3  0.0 - 0.5 10e3/uL Final  . Basophils Absolute 01/19/2017 0.0  0.0 -  0.1 10e3/uL Final  . NEUT% 01/19/2017 75.8* 39.0 - 75.0 % Final  . LYMPH% 01/19/2017 9.9* 14.0 - 49.0 % Final  . MONO% 01/19/2017 10.2  0.0 - 14.0 % Final  . EOS% 01/19/2017 3.7  0.0 - 7.0 % Final  . BASO% 01/19/2017 0.4  0.0 - 2.0 % Final  . Sodium 01/19/2017 140  136 - 145 mEq/L Final  . Potassium 01/19/2017 4.0  3.5 - 5.1 mEq/L Final  . Chloride 01/19/2017 102  98 - 109 mEq/L Final  . CO2 01/19/2017 28  22 - 29 mEq/L Final  . Glucose 01/19/2017 150* 70 - 140 mg/dl Final   Glucose reference range is for nonfasting patients. Fasting glucose reference range is 70- 100.  Marland Kitchen BUN 01/19/2017 17.6  7.0 - 26.0 mg/dL Final  . Creatinine 01/19/2017 0.7  0.7 - 1.3 mg/dL Final  . Total Bilirubin 01/19/2017 0.34  0.20 - 1.20 mg/dL Final  . Alkaline Phosphatase 01/19/2017 119  40 - 150 U/L Final  . AST 01/19/2017 18  5 - 34 U/L Final  . ALT 01/19/2017 26  0 - 55 U/L Final  . Total Protein 01/19/2017 7.8  6.4 - 8.3 g/dL Final  . Albumin 01/19/2017 3.5  3.5 - 5.0 g/dL Final  . Calcium 01/19/2017 10.0  8.4 - 10.4 mg/dL Final  . Anion Gap 01/19/2017 10  3 - 11 mEq/L Final  . EGFR 01/19/2017 >90  >90 ml/min/1.73 m2 Final   eGFR is calculated using the CKD-EPI Creatinine Equation (2009)       Ardath Sax, MD

## 2017-01-19 NOTE — Assessment & Plan Note (Signed)
61 year old male with diagnosis of localized adenocarcinoma of the pancreatic head treated with neoadjuvant chemotherapy followed by definitive surgery. Complicated and delayed surgical recovery to slight delay in the initiation of systemic chemotherapy. At this time, patient has sufficiently healed after his surgery. His nutritional status is improving. Surgical incision is nearly totally healed with only a small area of granulation tissue and no evidence of dehiscence, fluid collection, or infection.  As per previous discussion, based on the initial stage of the malignancy, we did recommend patient to proceed with adjuvant systemic chemotherapy to minimize the chances of cancer recurrence postoperatively. Based on his clinical picture, single agent gemcitabine is the appropriate choice of treatment at this point in time.  Clinical evaluation and lab work up or Mrs. to proceed with initial dose of gemcitabine as scheduled.  --Proceed with Gemcitabine C1 d1 today; the patient will return area in 1 week for day 8 of administration of the chemotherapy. --On RTC in 3 weeks: Labs, clinic visit, possible second cycle of gemcitabine chemotherapy --Disease assessment by imaging and discussion of the primary treating oncologist  Voice recognition software was used and creation of this note. Despite my best effort at editing the text, some misspelling/errors may have occurred.

## 2017-01-20 LAB — CANCER ANTIGEN 19-9: CA 19-9: 36 U/mL — ABNORMAL HIGH (ref 0–35)

## 2017-01-21 ENCOUNTER — Other Ambulatory Visit: Payer: Self-pay | Admitting: Family Medicine

## 2017-01-22 ENCOUNTER — Other Ambulatory Visit: Payer: Self-pay | Admitting: Family Medicine

## 2017-01-26 ENCOUNTER — Other Ambulatory Visit (HOSPITAL_BASED_OUTPATIENT_CLINIC_OR_DEPARTMENT_OTHER): Payer: 59

## 2017-01-26 ENCOUNTER — Ambulatory Visit (HOSPITAL_BASED_OUTPATIENT_CLINIC_OR_DEPARTMENT_OTHER): Payer: 59

## 2017-01-26 ENCOUNTER — Ambulatory Visit: Payer: 59

## 2017-01-26 VITALS — BP 118/66 | HR 73 | Temp 98.4°F | Resp 16

## 2017-01-26 DIAGNOSIS — C250 Malignant neoplasm of head of pancreas: Secondary | ICD-10-CM

## 2017-01-26 DIAGNOSIS — Z5111 Encounter for antineoplastic chemotherapy: Secondary | ICD-10-CM

## 2017-01-26 DIAGNOSIS — Z95828 Presence of other vascular implants and grafts: Secondary | ICD-10-CM

## 2017-01-26 LAB — CBC WITH DIFFERENTIAL/PLATELET
BASO%: 0.8 % (ref 0.0–2.0)
Basophils Absolute: 0 10*3/uL (ref 0.0–0.1)
EOS%: 1.8 % (ref 0.0–7.0)
Eosinophils Absolute: 0.1 10*3/uL (ref 0.0–0.5)
HCT: 32.2 % — ABNORMAL LOW (ref 38.4–49.9)
HGB: 10.3 g/dL — ABNORMAL LOW (ref 13.0–17.1)
LYMPH%: 15 % (ref 14.0–49.0)
MCH: 27.1 pg — ABNORMAL LOW (ref 27.2–33.4)
MCHC: 32 g/dL (ref 32.0–36.0)
MCV: 84.7 fL (ref 79.3–98.0)
MONO#: 0.5 10*3/uL (ref 0.1–0.9)
MONO%: 12.8 % (ref 0.0–14.0)
NEUT#: 2.8 10*3/uL (ref 1.5–6.5)
NEUT%: 69.6 % (ref 39.0–75.0)
Platelets: 246 10*3/uL (ref 140–400)
RBC: 3.8 10*6/uL — ABNORMAL LOW (ref 4.20–5.82)
RDW: 14.2 % (ref 11.0–14.6)
WBC: 4 10*3/uL (ref 4.0–10.3)
lymph#: 0.6 10*3/uL — ABNORMAL LOW (ref 0.9–3.3)

## 2017-01-26 LAB — COMPREHENSIVE METABOLIC PANEL
ALT: 33 U/L (ref 0–55)
AST: 24 U/L (ref 5–34)
Albumin: 3.6 g/dL (ref 3.5–5.0)
Alkaline Phosphatase: 122 U/L (ref 40–150)
Anion Gap: 7 mEq/L (ref 3–11)
BUN: 19 mg/dL (ref 7.0–26.0)
CO2: 28 mEq/L (ref 22–29)
Calcium: 9.6 mg/dL (ref 8.4–10.4)
Chloride: 105 mEq/L (ref 98–109)
Creatinine: 0.7 mg/dL (ref 0.7–1.3)
EGFR: >90 mL/min/{1.73_m2} (ref >90)
Glucose: 99 mg/dl (ref 70–140)
Potassium: 3.7 mEq/L (ref 3.5–5.1)
Sodium: 140 mEq/L (ref 136–145)
Total Bilirubin: 0.35 mg/dL (ref 0.20–1.20)
Total Protein: 7.6 g/dL (ref 6.4–8.3)

## 2017-01-26 MED ORDER — SODIUM CHLORIDE 0.9% FLUSH
10.0000 mL | INTRAVENOUS | Status: DC | PRN
  Administered 2017-01-26: 10 mL
  Filled 2017-01-26: qty 10

## 2017-01-26 MED ORDER — HEPARIN SOD (PORK) LOCK FLUSH 100 UNIT/ML IV SOLN
500.0000 [IU] | Freq: Once | INTRAVENOUS | Status: AC | PRN
  Administered 2017-01-26: 500 [IU]
  Filled 2017-01-26: qty 5

## 2017-01-26 MED ORDER — SODIUM CHLORIDE 0.9 % IJ SOLN
10.0000 mL | INTRAMUSCULAR | Status: DC | PRN
  Administered 2017-01-26: 10 mL via INTRAVENOUS
  Filled 2017-01-26: qty 10

## 2017-01-26 MED ORDER — SODIUM CHLORIDE 0.9 % IV SOLN
2000.0000 mg | Freq: Once | INTRAVENOUS | Status: AC
  Administered 2017-01-26: 2000 mg via INTRAVENOUS
  Filled 2017-01-26: qty 52.6

## 2017-01-26 MED ORDER — PROCHLORPERAZINE MALEATE 10 MG PO TABS
10.0000 mg | ORAL_TABLET | Freq: Once | ORAL | Status: AC
  Administered 2017-01-26: 10 mg via ORAL

## 2017-01-26 MED ORDER — PROCHLORPERAZINE MALEATE 10 MG PO TABS
ORAL_TABLET | ORAL | Status: AC
  Filled 2017-01-26: qty 1

## 2017-01-26 MED ORDER — SODIUM CHLORIDE 0.9 % IV SOLN
Freq: Once | INTRAVENOUS | Status: AC
  Administered 2017-01-26: 13:00:00 via INTRAVENOUS

## 2017-01-26 NOTE — Patient Instructions (Signed)
[facility] Discharge Instructions for Patients Receiving Chemotherapy  Today you received the following chemotherapy agents Gemzar.   To help prevent nausea and vomiting after your treatment, we encourage you to take your nausea medication as prescribed.    If you develop nausea and vomiting that is not controlled by your nausea medication, call the clinic.   BELOW ARE SYMPTOMS THAT SHOULD BE REPORTED IMMEDIATELY:  *FEVER GREATER THAN 100.5 F  *CHILLS WITH OR WITHOUT FEVER  NAUSEA AND VOMITING THAT IS NOT CONTROLLED WITH YOUR NAUSEA MEDICATION  *UNUSUAL SHORTNESS OF BREATH  *UNUSUAL BRUISING OR BLEEDING  TENDERNESS IN MOUTH AND THROAT WITH OR WITHOUT PRESENCE OF ULCERS  *URINARY PROBLEMS  *BOWEL PROBLEMS  UNUSUAL RASH Items with * indicate a potential emergency and should be followed up as soon as possible.  Feel free to call the clinic you have any questions or concerns. The clinic phone number is [phone].  Please show the [facility] at check-in to the Emergency Department and triage nurse.     Gemcitabine injection What is this medicine? GEMCITABINE (jem SIT a been) is a chemotherapy drug. This medicine is used to treat many types of cancer like breast cancer, lung cancer, pancreatic cancer, and ovarian cancer. This medicine may be used for other purposes; ask your health care provider or pharmacist if you have questions. COMMON BRAND NAME(S): Gemzar What should I tell my health care provider before I take this medicine? They need to know if you have any of these conditions: -blood disorders -infection -kidney disease -liver disease -recent or ongoing radiation therapy -an unusual or allergic reaction to gemcitabine, other chemotherapy, other medicines, foods, dyes, or preservatives -pregnant or trying to get pregnant -breast-feeding How should I use this medicine? This drug is given as an infusion into a vein. It is administered in a  hospital or clinic by a specially trained health care professional. Talk to your pediatrician regarding the use of this medicine in children. Special care may be needed. Overdosage: If you think you have taken too much of this medicine contact a poison control center or emergency room at once. NOTE: This medicine is only for you. Do not share this medicine with others. What if I miss a dose? It is important not to miss your dose. Call your doctor or health care professional if you are unable to keep an appointment. What may interact with this medicine? -medicines to increase blood counts like filgrastim, pegfilgrastim, sargramostim -some other chemotherapy drugs like cisplatin -vaccines Talk to your doctor or health care professional before taking any of these medicines: -acetaminophen -aspirin -ibuprofen -ketoprofen -naproxen This list may not describe all possible interactions. Give your health care provider a list of all the medicines, herbs, non-prescription drugs, or dietary supplements you use. Also tell them if you smoke, drink alcohol, or use illegal drugs. Some items may interact with your medicine. What should I watch for while using this medicine? Visit your doctor for checks on your progress. This drug may make you feel generally unwell. This is not uncommon, as chemotherapy can affect healthy cells as well as cancer cells. Report any side effects. Continue your course of treatment even though you feel ill unless your doctor tells you to stop. In some cases, you may be given additional medicines to help with side effects. Follow all directions for their use. Call your doctor or health care professional for advice if you get a fever, chills or sore throat, or other symptoms  of a cold or flu. Do not treat yourself. This drug decreases your body's ability to fight infections. Try to avoid being around people who are sick. This medicine may increase your risk to bruise or bleed. Call  your doctor or health care professional if you notice any unusual bleeding. Be careful brushing and flossing your teeth or using a toothpick because you may get an infection or bleed more easily. If you have any dental work done, tell your dentist you are receiving this medicine. Avoid taking products that contain aspirin, acetaminophen, ibuprofen, naproxen, or ketoprofen unless instructed by your doctor. These medicines may hide a fever. Women should inform their doctor if they wish to become pregnant or think they might be pregnant. There is a potential for serious side effects to an unborn child. Talk to your health care professional or pharmacist for more information. Do not breast-feed an infant while taking this medicine. What side effects may I notice from receiving this medicine? Side effects that you should report to your doctor or health care professional as soon as possible: -allergic reactions like skin rash, itching or hives, swelling of the face, lips, or tongue -low blood counts - this medicine may decrease the number of white blood cells, red blood cells and platelets. You may be at increased risk for infections and bleeding. -signs of infection - fever or chills, cough, sore throat, pain or difficulty passing urine -signs of decreased platelets or bleeding - bruising, pinpoint red spots on the skin, black, tarry stools, blood in the urine -signs of decreased red blood cells - unusually weak or tired, fainting spells, lightheadedness -breathing problems -chest pain -mouth sores -nausea and vomiting -pain, swelling, redness at site where injected -pain, tingling, numbness in the hands or feet -stomach pain -swelling of ankles, feet, hands -unusual bleeding Side effects that usually do not require medical attention (report to your doctor or health care professional if they continue or are bothersome): -constipation -diarrhea -hair loss -loss of appetite -stomach upset This  list may not describe all possible side effects. Call your doctor for medical advice about side effects. You may report side effects to FDA at 1-800-FDA-1088. Where should I keep my medicine? This drug is given in a hospital or clinic and will not be stored at home. NOTE: This sheet is a summary. It may not cover all possible information. If you have questions about this medicine, talk to your doctor, pharmacist, or health care provider.  2018 [employer] (2007-11-26 18:45:54)

## 2017-01-27 ENCOUNTER — Other Ambulatory Visit: Payer: Self-pay | Admitting: Family Medicine

## 2017-01-29 NOTE — Telephone Encounter (Signed)
Can we refill this? 

## 2017-02-07 NOTE — Progress Notes (Signed)
[facility]  [phone] [phone]  Clinic Follow up Note   Patient Care Team: [name], MD as PCP - General 02/09/2017  CHIEF COMPLAINTS:  Follow up pancreatic cancer  Oncology History   Cancer Staging Adenocarcinoma of head of pancreas [facility]) Staging form: Pancreas, AJCC 7th Edition - Clinical stage from 03/09/2016: Stage IIB (T2, N1, M0) - Signed by Truitt Merle, MD on 03/17/2016 - Pathologic stage from 11/17/2016: Stage IB (yT2, N0, cM0) - Signed by Truitt Merle, MD on 12/10/2016       Adenocarcinoma of head of pancreas ([place])   02/27/2016 Imaging    CT chest, abdomen and pelvis with contrast showed ill-defined heterogeneity of pancreatic head, highly suspicious for malignancy, ) quit about and biliary ductal dilatation, mildly prominent lymph nodes in the upper abdomen, largest 2 cm in the portocaval . No other metastasis.       03/09/2016 Initial Diagnosis    Adenocarcinoma of head of pancreas ([place])      03/09/2016 Procedure    Upper EUS showed a 3.5 cm irregular mass in the pancreatic head, causing pancreatic and biliary duct obstruction. The mass involves the portal vein 422 mm, strongly suggesting invasion. There is suspicious nearby adenopathy      03/09/2016 Initial Biopsy    Fine-needle aspiration of the pancreatic mass from EUS showed malignant cells consistent with adenocarcinoma.       03/28/2016 - 05/24/2016 Chemotherapy    neoadjuvant chemo with FOLFIRINOX every 2 weeks, for 5 cycles       06/28/2016 - 08/10/2016 Radiation Therapy    Neoadjuvant radiation by Dr. [name] Site/dose:   Pancreas treated to 45 Gy in 25 fractions. The Pancreas was then boosted to 54 Gy in 5 fractions.      06/28/2016 - 08/10/2016 Chemotherapy    Xeloda 2000 mg in the morning and 1500 mg in the evening, on the day of radiation.      09/04/2016 Imaging    CT Chest Abdomen Pelvis IMPRESSION: 1. Poorly defined pancreatic head mass is grossly stable,  with associated pancreatic ductal dilatation and portacaval adenopathy. No associated vascular encasement. 2. Common bile duct stent in place with stable biliary ductal dilatation. Left hepatic lobe atrophy. 3. Hepatomegaly.  Spleen is at the upper limits normal in size. 4. Aortic atherosclerosis (ICD10-170.0). Coronary artery calcification.      09/19/2016 Surgery    Diagnostic laparoscopy and possible whipple procedure by Dr. Barry Dienes. Whipple procedure aborted due to the pancreatic cancer involving SMV, SMA and portal veins.      09/19/2016 Pathology Results    Two liver lesions were noted during diagnostic laparoscopy and biopsy revealed benign hepatic parenchyma with assoicated fibrosis with no evidence of malignancy.      10/06/2016 - 10/20/2016 Chemotherapy    Gemcitabine and Abraxane weekly, on day 1, 8, and 15 every 28 days, starting on 10/06/2016, held after1 cycle due to pending surgery at [facility]      10/31/2016 Imaging    CT CAP w contrast at [facility] 10/31/16 Impression: 1.Interval decrease in the size of the ill-defined pancreatic head mass. There is short segment abutment of the portal vein/superior mesenteric vein but no evidence of venous distortion. 2.No evidence of metastatic disease in the chest, abdomen and pelvis.      11/17/2016 -  Hospital Admission    Patient presents to hospital for nausea and vomitting      11/17/2016 Surgery    pancreatecomy, promximal subtotal with total  duodenectomy, partial  GASTRECTOMY, CHOLEDOCHOENTEROSTOMY AND GASTROJEJUNOSTOMY (WHIPPLE-TYPE PROCEDURE); WITH PANCREATOJEJUNOSTOMY      01/19/2017 -  Chemotherapy    Gemcitabine monotherapy: 1000mg /m2, d1,8 Q21d x2 cycles planned        HISTORY OF PRESENTING ILLNESS:  Colton Bush 61 y.o. male is here because of His newly diagnosed pancreatic cancer. He is accompanied by his wife to our multidisciplinary GI clinic today.  He noticed fatigue, anorexia and weight loss about 50bs in the past  4 months. It started when his endocrinologist changed his diabetic meds. He presented to hospital on 7/30 with jaundice. No pain or nausea. CT scan revealed a heterogeneous pancreatic head mass, highly suspicious for malignancy. Mild biliary and pancreatic duct dilatation, mildly prominent lymph nodes in the upper abdomen. He underwent ERCP and CBD stent placement, and subsequent upper EUS on 03/09/2016, which showed a 3.5 cm irregular mass in the pancreatic head, the mass involves the portal vein, no suspicious nearby adenopathy.  He has been feeling better after stent placement, with improved appetite and energy level. He feels normal again. No symptoms. BM is good, he has gained about 8lbs back.   CURRENT THERAPY: Gemcitabine monotherapy: 1000mg /m2, d1,8 Q21d x2 cyles started 01/19/17   INTERIM HISTORY:  [name] returns for follow up. He presents to the clinic today reporting his chemo went well. He did not get tired. He will follow up with surgeon in October. He is changing the dressing himself everyday. His energy and appetite is fine. He has not gone back to work yet but he would like to go back part time. He was thinking of going back next month. He feels steady when walking and has not experienced a fall. He was brought by his wife today.        MEDICAL HISTORY:  Past Medical History:  Diagnosis Date  . Arthritis    left hand  . Bronchitis 1977  . Cancer ([place]) 03/09/2016   pancreatic cancer  . Depression    takes Cymbalta daily  . Diabetes mellitus type II    sees [name]   . GERD (gastroesophageal reflux disease)    takes Omeprazole daily  . H/O hiatal hernia   . Hyperlipidemia    takes Zocor daily  . Hypertension    takes Amlodipine daily  . Neck pain    C4-7 stenosis and herniated disc  . Neuromuscular disorder ([place])    hiatal hernia  . Scoliosis    slight  . Spinal cord injury, C5-C7 ([place])    c4-c7  . Stiffness of hand joint    d/t cervical issues    SURGICAL  HISTORY: Past Surgical History:  Procedure Laterality Date  . ANTERIOR CERVICAL DECOMP/DISCECTOMY FUSION  08/18/2011   Procedure: ANTERIOR CERVICAL DECOMPRESSION/DISCECTOMY FUSION 3 LEVELS;  Surgeon: Winfield Cunas, MD;  Location: [facility];  Service: Neurosurgery;  Laterality: N/A;  Anterior Cervical Four-Five/Five-Six/Six-Seven Decompression with Fusion, Plating, and Bonegraft  . CARPAL TUNNEL RELEASE  2013   bilateral, per Dr. Christella Noa   . COLONOSCOPY  10-30-14   per Dr. Olevia Perches, clear, repeat in 10 yrs   . egd with esophageal dilation  9-08   per Dr. Olevia Perches  . ERCP N/A 03/01/2016   Procedure: ENDOSCOPIC RETROGRADE CHOLANGIOPANCREATOGRAPHY (ERCP) with brushings and stent;  Surgeon: Doran Stabler, MD;  Location: [facility];  Service: Endoscopy;  Laterality: N/A;  . EUS N/A 03/09/2016   Procedure: ESOPHAGEAL ENDOSCOPIC ULTRASOUND (EUS) RADIAL;  Surgeon: Milus Banister, MD;  Location: [facility];  Service: Endoscopy;  Laterality: N/A;  . lymph nodes biopsy    . melanoma rt calf  1999  . PORTACATH PLACEMENT Left 03/22/2016   Procedure: INSERTION PORT-A-CATH;  Surgeon: Stark Klein, MD;  Location: [facility]S;  Service: General;  Laterality: Left;  . SPINE SURGERY    . TONSILLECTOMY     as a child  . ULNAR TUNNEL RELEASE  2013   right arm, per Dr. Christella Noa   . UPPER GASTROINTESTINAL ENDOSCOPY    . WHIPPLE PROCEDURE N/A 09/19/2016   Procedure: DIAGNOSTIC LAPAROSCOPY, LAPAROSCOPIC LIVER BIOPSY, RETROPERITONEAL EXPLORATION, INTRAOPERATIVE ULTRASOUND;  Surgeon: Stark Klein, MD;  Location: [facility] OR;  Service: General;  Laterality: N/A;    SOCIAL HISTORY: Social History   Social History  . Marital status: Married    Spouse name: [name]  . Number of children: N/A  . Years of education: N/A   Occupational History  . Driver    Social History Main Topics  . Smoking status: Never Smoker  . Smokeless tobacco: Never Used     Comment: tried a pipe 35 years ago   . Alcohol use No  . Drug use:  No  . Sexual activity: Yes   Other Topics Concern  . Not on file   Social History Narrative   Married, wife Rosaria Ferries Nutritional therapist-   He works as a Geophysicist/field seismologist for a company  He has one son 62 yo. Lives with his wife   FAMILY HISTORY: Family History  Problem Relation Age of Onset  . Heart disease Father   . Heart disease Brother 26  . Anesthesia problems Mother   . Heart disease Mother   . Dementia Mother   . Diabetes Sister   . Stroke Sister   . Colon cancer Neg Hx   . Rectal cancer Neg Hx   . Stomach cancer Neg Hx     ALLERGIES:  is allergic to no known allergies.  MEDICATIONS:  Current Outpatient Prescriptions  Medication Sig Dispense Refill  . amLODipine (NORVASC) 10 MG tablet TAKE 1 TABLET BY MOUTH EVERY DAY 90 tablet 3  . benazepril (LOTENSIN) 10 MG tablet TAKE 1 TABLET (10 MG TOTAL) BY MOUTH DAILY. 30 tablet 2  . FREESTYLE LITE test strip USE ONE STRIP TO CHECK GLUCOSE ONCE DAILY. PLEASE  SCHEDULE FOLLOW UP 50 each 0  . insulin aspart (NOVOLOG) 100 UNIT/ML injection Give every morning with breakfast and every evening with supper per the following sliding scale: give 3 units for glucose 150-175, give 6 units for glucose 176-200, give 9 units for glucose 201-250, give 12 units for glucose greater than 250 3 vial 11  . Insulin Detemir (LEVEMIR) 100 UNIT/ML Pen Inject 15 Units into the skin 2 (two) times daily. 90 mL 3  . Insulin Pen Needle 30G X 5 MM MISC Use one daily with insulin 100 each 2  . JARDIANCE 25 MG TABS tablet TAKE 1 TABLET BY MOUTH EVERY DAY 90 tablet 1  . Lancets (FREESTYLE) lancets USE AS INSTRUCTED TO CHECK BLOOD SUGAR ONCE A DAY 100 each 2  . lidocaine-prilocaine (EMLA) cream Apply to affected area once 30 g 3  . loratadine (CLARITIN) 10 MG tablet Take 10 mg by mouth daily.    Marland Kitchen LORazepam (ATIVAN) 0.5 MG tablet Take 1 tablet (0.5 mg total) by mouth every 6 (six) hours as needed (Nausea or vomiting). 30 tablet 0  . magnesium oxide (MAG-OX) 400 (241.3  Mg) MG tablet Take 1  tablet (400 mg total) by mouth daily. 90 tablet 1  . Multiple Vitamin (MULTIVITAMIN WITH MINERALS) TABS tablet Take 1 tablet by mouth daily.    . naproxen sodium (ANAPROX) 220 MG tablet Take 440 mg by mouth 2 (two) times daily as needed (for pain.).    Marland Kitchen Omega-3 Fatty Acids (FISH OIL) 1200 MG CAPS Take 2,400 mg by mouth daily.    Marland Kitchen omeprazole (PRILOSEC) 20 MG capsule TAKE ONE CAPSULE BY MOUTH ONCE DAILY 90 capsule 3  . ondansetron (ZOFRAN) 8 MG tablet Take 1 tablet (8 mg total) by mouth 2 (two) times daily as needed (Nausea or vomiting). 30 tablet 1  . oxyCODONE (OXY IR/ROXICODONE) 5 MG immediate release tablet Take 1-2 tablets (5-10 mg total) by mouth every 4 (four) hours as needed for moderate pain, severe pain or breakthrough pain. (Patient not taking: Reported on 12/08/2016) 40 tablet 0  . potassium chloride SA (KLOR-CON M20) 20 MEQ tablet Take 1 tablet (20 mEq total) by mouth daily. 30 tablet 1  . prochlorperazine (COMPAZINE) 10 MG tablet Take 1 tablet (10 mg total) by mouth every 6 (six) hours as needed (Nausea or vomiting). 30 tablet 1  . traMADol (ULTRAM) 50 MG tablet Take 50 mg by mouth every 6 (six) hours as needed.     No current facility-administered medications for this visit.    Facility-Administered Medications Ordered in Other Visits  Medication Dose Route Frequency Provider Last Rate Last Dose  . sodium chloride 0.9 % injection 10 mL  10 mL Intravenous PRN Truitt Merle, MD   10 mL at 02/09/17 0907    REVIEW OF SYSTEMS:  Constitutional: Denies fevers, chills or abnormal night sweats  Eyes: Denies blurriness of vision, double vision or watery eyes Ears, nose, mouth, throat, and face: Denies mucositis or sore throat Respiratory: Denies cough, dyspnea or wheezes Cardiovascular: Denies palpitation, chest discomfort or lower extremity swelling Gastrointestinal:  Denies nausea, heartburn or change in bowel habits (+) draining/discharge from midline incision Skin:  Denies abnormal skin rashes Lymphatics: Denies new lymphadenopathy or easy bruising Neurological:Denies numbness, tingling or new weaknesses Behavioral/Psych: Mood is stable, no new changes  All other systems were reviewed with the patient and are negative.  PHYSICAL EXAMINATION:  ECOG PERFORMANCE STATUS: 1  Vitals:   02/09/17 0926  BP: 135/60  Pulse: 67  Resp: 18  Temp: 98.4 F (36.9 C)   Filed Weights   02/09/17 0926  Weight: 192 lb 3.2 oz (87.2 kg)    GENERAL:alert, no distress and comfortable.  SKIN: skin color, texture, turgor are normal, no rashes or significant lesions EYES: normal, conjunctiva are pink and non-injected, sclera clear OROPHARYNX:no exudate, no erythema and lips, buccal mucosa, and tongue normal  NECK: supple, thyroid normal size, non-tender, without nodularity LYMPH:  no palpable lymphadenopathy in the cervical, axillary or inguinal LUNGS: clear to auscultation and percussion with normal breathing effort HEART: regular rate & rhythm and no murmurs and no lower extremity edema ABDOMEN:abdomen soft, non-tender and normal bowel sounds, midline surgical scar is healing, slight discharge in the top portion of incision.  Musculoskeletal:no cyanosis of digits and no clubbing  PSYCH: alert & oriented x 3 with fluent speech NEURO: no focal motor/sensory deficits  LABORATORY DATA:  I have reviewed the data as listed CBC Latest Ref Rng & Units 01/26/2017 01/19/2017 12/08/2016  WBC 4.0 - 10.3 10e3/uL 4.0 7.0 8.0  Hemoglobin 13.0 - 17.1 g/dL 10.3(L) 10.9(L) 11.4(L)  Hematocrit 38.4 - 49.9 % 32.2(L) 34.0(L) 35.1(L)  Platelets  140 - 400 10e3/uL 246 292 404(H)   CMP Latest Ref Rng & Units 01/26/2017 01/19/2017 12/08/2016  Glucose 70 - 140 mg/dl 99 150(H) 314(H)  BUN 7.0 - 26.0 mg/dL 19.0 17.6 15.6  Creatinine 0.7 - 1.3 mg/dL 0.7 0.7 1.0  Sodium 136 - 145 mEq/L 140 140 140  Potassium 3.5 - 5.1 mEq/L 3.7 4.0 4.4  Chloride 101 - 111 mmol/L - - -  CO2 22 - 29 mEq/L 28  28 28   Calcium 8.4 - 10.4 mg/dL 9.6 10.0 9.4  Total Protein 6.4 - 8.3 g/dL 7.6 7.8 7.8  Total Bilirubin 0.20 - 1.20 mg/dL 0.35 0.34 0.35  Alkaline Phos 40 - 150 U/L 122 119 191(H)  AST 5 - 34 U/L 24 18 14   ALT 0 - 55 U/L 33 26 17    CA19.9 (0-35 U/ML)  02/28/2016: 1497 03/24/2016: 648 04/25/2016: 599 05/24/2016: 368 07/18/2016: 51 08/28/16: 55 10/06/16: 133 12/08/2016: 36 01/19/17: 36 02/07/17: PENDING    PATHOLOGY REPORT   Surgical Pathology 11/17/16 Dr. Mariah Milling  A. Head of pancreas, stomach, and duodenum, pancreatoduodenectomy (Whipple procedure) post oncologic treatment:  - Invasive moderately differentiated ductal adenocarcinoma (2.7 cm). - Perineural invasion is seen. - Twenty lymph nodes, negative for tumor (0/20). - Very minimal treatment response, if any. - Margins of resection are negative for tumor (2.5 mm to uncinate margin, 3 mm to bile duct and pancreatic neck margins).   B. Gallbladder and contents, cholecystectomy:  Chronic cholecystitis with acute mucosal inflammation and reactive atypia.   Synoptic Report PANCREAS (EXOCRINE)(Pancreas Exo - A)     Liver, biopsy 09/19/16 - BENIGN HEPATIC PARENCHYMA WITH ASSOCIATED FIBROSIS. - THERE IS NO EVIDENCE OF MALIGNANCY.  Diagnosis 03/09/2016 FINE NEEDLE ASPIRATION, ENDOSCOPIC, PANCREAS (SPECIMEN 1 OF 1 COLLECTED 03/09/16): MALIGNANT CELLS CONSISTENT WITH ADENOCARCINOMA. BACKGROUND NECROTIC DEBRIS AND ACUTE INFLAMMATION.  Diagnosis 03/01/2016 BILE DUCT BRUSHING(SPECIMEN 1 OF 1 COLLECTED 03/01/16): RARE ATYPICAL CELLS, SEE COMMENT.  RADIOGRAPHIC STUDIES: I have personally reviewed the radiological images as listed and agreed with the findings in the report. No results found.   CT CAP W WO CONTRAST 09/04/16 IMPRESSION: 1. Poorly defined pancreatic head mass is grossly stable, with associated pancreatic ductal dilatation and portacaval adenopathy. No associated vascular encasement. 2. Common bile duct stent in place with  stable biliary ductal dilatation. Left hepatic lobe atrophy. 3. Hepatomegaly.  Spleen is at the upper limits normal in size. 4. Aortic atherosclerosis (ICD10-170.0). Coronary artery calcification.  DG CEST PORT 1 09/19/16 IMPRESSION: 1. Lines and tubes noted as above. 2. Mild bibasilar subsegmental atelectasis. 3. Cardiomegaly, no pulmonary venous congestion. 4. Free intraperitoneal air consistent with prior surgery.  EUS 03/09/2016 Dr. Ardis Hughs  - 3.5cm irregularly shaped, poorly defined mass in the pancreatic head. There is suspicious, nearby adenopathy. The mass is causing pancreatic and bililary duct obstruction (previously stented). The mass involves the portal vein for 33mm (abuttment and loss of usual tissue interface), strongly suggesting invasion. Preliminary cytology results are positive for malignancy (adenocarcinoma), await final report.  ASSESSMENT & PLAN: 61 y.o. Caucasian male, with past medical history of diabetes and hypertension, presented with epigastric pain, weight loss, and jaundice.  1. Primary pancreatic adenocarcinoma, in pancreatic head, cT2N1M0, stage IIB, ypT2N0M0 -I have previously reviewed his CT abdomen and pelvis with contrast, EUS and biopsy findings with patient and his wife in great details. -His case was previously reviewed in our GI tumor board a few days ago. CT scan and US showed possible portal vein invasion from the pancreatic  tumor, this is borderline resectable disease. No evidence of distant metastasis.  -We previously reviewed the nature history of pancreatic cancer, and the overall survival rate with chemotherapy, surgery, and radiation. Patient and his wife was discouraged by the overall dismal long term survival rate (~20%)  -He received neoadjuvant chemotherapy with FOLFIRINOX and neoadjuvant chemoradiation, tolerated well  -He previously underwent diagnostic laparoscopy and possible whipple procedure on 09/19/2016 by Dr. Barry Dienes to attempt  removal of the pancreatic tumor. The tumor appeared to be encasing the SMV/portal vein confluence with blurring of the interface. The tumor also appeared to be invading the superior mesenteric artery on the right lateral surface. This point, the procedure was aborted. -Dr. Mariah Milling at [facility] performed Whipple surgery on 11/17/16, he had complete resection  -I reviewed his surgical pathology findings, which showed a T2 tumor, surgical margins were negative, all lymph nodes were negative. - He is recovering well after surgery. I encouraged him to try to eat more, try to gain a little weight and be active.  - I again discussed the role  Adjuvant chemotherapy. He received about 4-1/2 months of neoadjuvant chemotherapy, I recommend an additional 9 weeks of gemcitabine.Once a week with 2 weeks on, 1 week off, for 3 cycles  -The goal of care is curative. -He started Gemcitabine 01/19/17 -repeat scan after he completes adjuvant chemo  -f/u in 3 weeks   2. Type 2 DM - he will continue medication and follow up with his primary care physician Dr. Sarajane Jews  -We previously reviewed that his blood glucose will need to be monitored closely during the chemotherapy, and his medication may need to be adjusted  -His blood glucose has been fluctuating lately, he sees a diabetic specialist and now, and checking his finger glucose 3 times a day, better controlled lately. -  I previously advised the patient to continue watching his blood glucose levels. - I previously recommended Glucerna as a nutritional supplement due to lower amount of carbohydrate  3. HTN -He is on amlodipine and benazepril  -His blood pressure is normal today. -The previously discussed chemotherapy may affect his blood pressure, and he is medication may need to be adjusted  4. Anemia secondary to surgery and chemotherapy -Continue monitoring, -Consider blood transfusion if hemoglobin less than 8   Plan -lab, flush and gemicitabin in 1, 3 and 4 weeks -  f/u in 3 weeks  All questions were answered. The patient knows to call the clinic with any problems, questions or concerns.  I spent 25 minutes counseling the patient face to face. The total time spent in the appointment was 30 minutes and more than 50% was on counseling.    Truitt Merle, MD 02/09/2017   This document serves as a record of services personally performed by Truitt Merle, MD. It was created on her behalf by Joslyn Devon, a trained medical scribe. The creation of this record is based on the scribe's personal observations and the provider's statements to them. This document has been checked and approved by the attending provider.

## 2017-02-09 ENCOUNTER — Ambulatory Visit (HOSPITAL_BASED_OUTPATIENT_CLINIC_OR_DEPARTMENT_OTHER): Payer: 59

## 2017-02-09 ENCOUNTER — Ambulatory Visit (HOSPITAL_BASED_OUTPATIENT_CLINIC_OR_DEPARTMENT_OTHER): Payer: 59 | Admitting: Hematology

## 2017-02-09 ENCOUNTER — Ambulatory Visit: Payer: 59

## 2017-02-09 ENCOUNTER — Other Ambulatory Visit: Payer: Self-pay | Admitting: *Deleted

## 2017-02-09 ENCOUNTER — Other Ambulatory Visit (HOSPITAL_BASED_OUTPATIENT_CLINIC_OR_DEPARTMENT_OTHER): Payer: 59

## 2017-02-09 VITALS — BP 135/60 | HR 67 | Temp 98.4°F | Resp 18 | Ht 70.0 in | Wt 192.2 lb

## 2017-02-09 DIAGNOSIS — Z5111 Encounter for antineoplastic chemotherapy: Secondary | ICD-10-CM

## 2017-02-09 DIAGNOSIS — I10 Essential (primary) hypertension: Secondary | ICD-10-CM | POA: Diagnosis not present

## 2017-02-09 DIAGNOSIS — D6481 Anemia due to antineoplastic chemotherapy: Secondary | ICD-10-CM | POA: Diagnosis not present

## 2017-02-09 DIAGNOSIS — C250 Malignant neoplasm of head of pancreas: Secondary | ICD-10-CM

## 2017-02-09 DIAGNOSIS — Z794 Long term (current) use of insulin: Secondary | ICD-10-CM

## 2017-02-09 DIAGNOSIS — E119 Type 2 diabetes mellitus without complications: Secondary | ICD-10-CM

## 2017-02-09 DIAGNOSIS — IMO0001 Reserved for inherently not codable concepts without codable children: Secondary | ICD-10-CM

## 2017-02-09 DIAGNOSIS — Z95828 Presence of other vascular implants and grafts: Secondary | ICD-10-CM

## 2017-02-09 DIAGNOSIS — E1165 Type 2 diabetes mellitus with hyperglycemia: Secondary | ICD-10-CM

## 2017-02-09 LAB — COMPREHENSIVE METABOLIC PANEL
ALT: 29 U/L (ref 0–55)
AST: 20 U/L (ref 5–34)
Albumin: 3.5 g/dL (ref 3.5–5.0)
Alkaline Phosphatase: 123 U/L (ref 40–150)
Anion Gap: 9 mEq/L (ref 3–11)
BUN: 13.4 mg/dL (ref 7.0–26.0)
CO2: 27 mEq/L (ref 22–29)
Calcium: 9.3 mg/dL (ref 8.4–10.4)
Chloride: 106 mEq/L (ref 98–109)
Creatinine: 0.6 mg/dL — ABNORMAL LOW (ref 0.7–1.3)
EGFR: >90 mL/min/{1.73_m2} (ref >90)
Glucose: 50 mg/dl — ABNORMAL LOW (ref 70–140)
Potassium: 3.5 mEq/L (ref 3.5–5.1)
Sodium: 143 mEq/L (ref 136–145)
Total Bilirubin: 0.35 mg/dL (ref 0.20–1.20)
Total Protein: 7.2 g/dL (ref 6.4–8.3)

## 2017-02-09 LAB — CBC WITH DIFFERENTIAL/PLATELET
BASO%: 0.9 % (ref 0.0–2.0)
Basophils Absolute: 0.1 10*3/uL (ref 0.0–0.1)
EOS%: 2.7 % (ref 0.0–7.0)
Eosinophils Absolute: 0.2 10*3/uL (ref 0.0–0.5)
HCT: 30.8 % — ABNORMAL LOW (ref 38.4–49.9)
HGB: 10 g/dL — ABNORMAL LOW (ref 13.0–17.1)
LYMPH%: 9.6 % — ABNORMAL LOW (ref 14.0–49.0)
MCH: 26.6 pg — ABNORMAL LOW (ref 27.2–33.4)
MCHC: 32.4 g/dL (ref 32.0–36.0)
MCV: 82.2 fL (ref 79.3–98.0)
MONO#: 0.9 10*3/uL (ref 0.1–0.9)
MONO%: 13 % (ref 0.0–14.0)
NEUT#: 5 10*3/uL (ref 1.5–6.5)
NEUT%: 73.8 % (ref 39.0–75.0)
Platelets: 389 10*3/uL (ref 140–400)
RBC: 3.75 10*6/uL — ABNORMAL LOW (ref 4.20–5.82)
RDW: 16 % — ABNORMAL HIGH (ref 11.0–14.6)
WBC: 6.7 10*3/uL (ref 4.0–10.3)
lymph#: 0.6 10*3/uL — ABNORMAL LOW (ref 0.9–3.3)

## 2017-02-09 MED ORDER — PROCHLORPERAZINE MALEATE 10 MG PO TABS
10.0000 mg | ORAL_TABLET | Freq: Once | ORAL | Status: AC
  Administered 2017-02-09: 10 mg via ORAL

## 2017-02-09 MED ORDER — PROCHLORPERAZINE MALEATE 10 MG PO TABS
ORAL_TABLET | ORAL | Status: AC
  Filled 2017-02-09: qty 1

## 2017-02-09 MED ORDER — SODIUM CHLORIDE 0.9% FLUSH
10.0000 mL | INTRAVENOUS | Status: DC | PRN
  Filled 2017-02-09: qty 10

## 2017-02-09 MED ORDER — SODIUM CHLORIDE 0.9 % IV SOLN
Freq: Once | INTRAVENOUS | Status: AC
  Administered 2017-02-09: 10:00:00 via INTRAVENOUS

## 2017-02-09 MED ORDER — POTASSIUM CHLORIDE CRYS ER 20 MEQ PO TBCR: 20.0000 meq | EXTENDED_RELEASE_TABLET | Freq: Every day | ORAL | 1 refills | Status: DC

## 2017-02-09 MED ORDER — SODIUM CHLORIDE 0.9 % IJ SOLN
10.0000 mL | INTRAMUSCULAR | Status: DC | PRN
  Administered 2017-02-09 (×2): 10 mL via INTRAVENOUS
  Filled 2017-02-09: qty 10

## 2017-02-09 MED ORDER — GEMCITABINE HCL CHEMO INJECTION 1 GM/26.3ML
2000.0000 mg | Freq: Once | INTRAVENOUS | Status: AC
  Administered 2017-02-09: 2000 mg via INTRAVENOUS
  Filled 2017-02-09: qty 52.6

## 2017-02-09 MED ORDER — HEPARIN SOD (PORK) LOCK FLUSH 100 UNIT/ML IV SOLN
500.0000 [IU] | Freq: Once | INTRAVENOUS | Status: AC | PRN
Start: 2017-02-09 — End: 2017-02-09
  Administered 2017-02-09: 500 [IU]
  Filled 2017-02-09: qty 5

## 2017-02-09 NOTE — Patient Instructions (Signed)

## 2017-02-09 NOTE — Patient Instructions (Addendum)
[facility] Discharge Instructions for Patients Receiving Chemotherapy   CHECK YOUR BLOOD SUGAR WHEN YOU GET HOME.  Today you received the following chemotherapy agents Gemzar.   To help prevent nausea and vomiting after your treatment, we encourage you to take your nausea medication as prescribed.    If you develop nausea and vomiting that is not controlled by your nausea medication, call the clinic.   BELOW ARE SYMPTOMS THAT SHOULD BE REPORTED IMMEDIATELY:  *FEVER GREATER THAN 100.5 F  *CHILLS WITH OR WITHOUT FEVER  NAUSEA AND VOMITING THAT IS NOT CONTROLLED WITH YOUR NAUSEA MEDICATION  *UNUSUAL SHORTNESS OF BREATH  *UNUSUAL BRUISING OR BLEEDING  TENDERNESS IN MOUTH AND THROAT WITH OR WITHOUT PRESENCE OF ULCERS  *URINARY PROBLEMS  *BOWEL PROBLEMS  UNUSUAL RASH Items with * indicate a potential emergency and should be followed up as soon as possible.  Feel free to call the clinic you have any questions or concerns. The clinic phone number is [phone].  Please show the [place] at check-in to the Emergency Department and triage nurse.     Gemcitabine injection What is this medicine? GEMCITABINE (jem SIT a been) is a chemotherapy drug. This medicine is used to treat many types of cancer like breast cancer, lung cancer, pancreatic cancer, and ovarian cancer. This medicine may be used for other purposes; ask your health care provider or pharmacist if you have questions. COMMON BRAND NAME(S): Gemzar What should I tell my health care provider before I take this medicine? They need to know if you have any of these conditions: -blood disorders -infection -kidney disease -liver disease -recent or ongoing radiation therapy -an unusual or allergic reaction to gemcitabine, other chemotherapy, other medicines, foods, dyes, or preservatives -pregnant or trying to get pregnant -breast-feeding How should I use this medicine? This drug is given as  an infusion into a vein. It is administered in a hospital or clinic by a specially trained health care professional. Talk to your pediatrician regarding the use of this medicine in children. Special care may be needed. Overdosage: If you think you have taken too much of this medicine contact a poison control center or emergency room at once. NOTE: This medicine is only for you. Do not share this medicine with others. What if I miss a dose? It is important not to miss your dose. Call your doctor or health care professional if you are unable to keep an appointment. What may interact with this medicine? -medicines to increase blood counts like filgrastim, pegfilgrastim, sargramostim -some other chemotherapy drugs like cisplatin -vaccines Talk to your doctor or health care professional before taking any of these medicines: -acetaminophen -aspirin -ibuprofen -ketoprofen -naproxen This list may not describe all possible interactions. Give your health care provider a list of all the medicines, herbs, non-prescription drugs, or dietary supplements you use. Also tell them if you smoke, drink alcohol, or use illegal drugs. Some items may interact with your medicine. What should I watch for while using this medicine? Visit your doctor for checks on your progress. This drug may make you feel generally unwell. This is not uncommon, as chemotherapy can affect healthy cells as well as cancer cells. Report any side effects. Continue your course of treatment even though you feel ill unless your doctor tells you to stop. In some cases, you may be given additional medicines to help with side effects. Follow all directions for their use. Call your doctor or health care professional for advice if you  get a fever, chills or sore throat, or other symptoms of a cold or flu. Do not treat yourself. This drug decreases your body's ability to fight infections. Try to avoid being around people who are sick. This medicine  may increase your risk to bruise or bleed. Call your doctor or health care professional if you notice any unusual bleeding. Be careful brushing and flossing your teeth or using a toothpick because you may get an infection or bleed more easily. If you have any dental work done, tell your dentist you are receiving this medicine. Avoid taking products that contain aspirin, acetaminophen, ibuprofen, naproxen, or ketoprofen unless instructed by your doctor. These medicines may hide a fever. Women should inform their doctor if they wish to become pregnant or think they might be pregnant. There is a potential for serious side effects to an unborn child. Talk to your health care professional or pharmacist for more information. Do not breast-feed an infant while taking this medicine. What side effects may I notice from receiving this medicine? Side effects that you should report to your doctor or health care professional as soon as possible: -allergic reactions like skin rash, itching or hives, swelling of the face, lips, or tongue -low blood counts - this medicine may decrease the number of white blood cells, red blood cells and platelets. You may be at increased risk for infections and bleeding. -signs of infection - fever or chills, cough, sore throat, pain or difficulty passing urine -signs of decreased platelets or bleeding - bruising, pinpoint red spots on the skin, black, tarry stools, blood in the urine -signs of decreased red blood cells - unusually weak or tired, fainting spells, lightheadedness -breathing problems -chest pain -mouth sores -nausea and vomiting -pain, swelling, redness at site where injected -pain, tingling, numbness in the hands or feet -stomach pain -swelling of ankles, feet, hands -unusual bleeding Side effects that usually do not require medical attention (report to your doctor or health care professional if they continue or are bothersome): -constipation -diarrhea -hair  loss -loss of appetite -stomach upset This list may not describe all possible side effects. Call your doctor for medical advice about side effects. You may report side effects to FDA at 1-800-FDA-1088. Where should I keep my medicine? This drug is given in a hospital or clinic and will not be stored at home. NOTE: This sheet is a summary. It may not cover all possible information. If you have questions about this medicine, talk to your doctor, pharmacist, or health care provider.  2018 Elsevier/Gold Standard (2007-11-26 18:45:54)   Hypoglycemia Hypoglycemia is when the sugar (glucose) level in the blood is too low. Symptoms of low blood sugar may include:  Feeling: ? Hungry. ? Worried or nervous (anxious). ? Sweaty and clammy. ? Confused. ? Dizzy. ? Sleepy. ? Sick to your stomach (nauseous).  Having: ? A fast heartbeat. ? A headache. ? A change in your vision. ? Jerky movements that you cannot control (seizure). ? Nightmares. ? Tingling or no feeling (numbness) around the mouth, lips, or tongue.  Having trouble with: ? Talking. ? Paying attention (concentrating). ? Moving (coordination). ? Sleeping.  Shaking.  Passing out (fainting).  Getting upset easily (irritability).  Low blood sugar can happen to people who have diabetes and people who do not have diabetes. Low blood sugar can happen quickly, and it can be an emergency. Treating Low Blood Sugar Low blood sugar is often treated by eating or drinking something sugary right away. If you  can think clearly and swallow safely, follow the 15:15 rule:  Take 15 grams of a fast-acting carb (carbohydrate). Some fast-acting carbs are: ? 1 tube of glucose gel. ? 3 sugar tablets (glucose pills). ? 6-8 pieces of hard candy. ? 4 oz (120 mL) of fruit juice. ? 4 oz (120 mL) of regular (not diet) soda.  Check your blood sugar 15 minutes after you take the carb.  If your blood sugar is still at or below 70 mg/dL (3.9 mmol/L),  take 15 grams of a carb again.  If your blood sugar does not go above 70 mg/dL (3.9 mmol/L) after 3 tries, get help right away.  After your blood sugar goes back to normal, eat a meal or a snack within 1 hour.  Treating Very Low Blood Sugar If your blood sugar is at or below 54 mg/dL (3 mmol/L), you have very low blood sugar (severe hypoglycemia). This is an emergency. Do not wait to see if the symptoms will go away. Get medical help right away. Call your local emergency services (911 in the [place] Do not drive yourself to the hospital. If you have very low blood sugar and you cannot eat or drink, you may need a glucagon shot (injection). A family member or friend should learn how to check your blood sugar and how to give you a glucagon shot. Ask your doctor if you need to have a glucagon shot kit at home. Follow these instructions at home: General instructions  Avoid any diets that cause you to not eat enough food. Talk with your doctor before you start any new diet.  Take over-the-counter and prescription medicines only as told by your doctor.  Limit alcohol to no more than 1 drink per day for nonpregnant women and 2 drinks per day for men. One drink equals 12 oz of beer, 5 oz of wine, or 1 oz of hard liquor.  Keep all follow-up visits as told by your doctor. This is important. If You Have Diabetes:   Make sure you know the symptoms of low blood sugar.  Always keep a source of sugar with you, such as: ? Sugar. ? Sugar tablets. ? Glucose gel. ? Fruit juice. ? Regular soda (not diet soda). ? Milk. ? Hard candy. ? Honey.  Take your medicines as told.  Follow your exercise and meal plan. ? Eat on time. Do not skip meals. ? Follow your sick day plan when you cannot eat or drink normally. Make this plan ahead of time with your doctor.  Check your blood sugar as often as told by your doctor. Always check before and after exercise.  Share your diabetes care plan with: ? Your  work or school. ? People you live with.  Check your pee (urine) for ketones: ? When you are sick. ? As told by your doctor.  Carry a card or wear jewelry that says you have diabetes. If You Have Low Blood Sugar From Other Causes:   Check your blood sugar as often as told by your doctor.  Follow instructions from your doctor about what you cannot eat or drink. Contact a doctor if:  You have trouble keeping your blood sugar in your target range.  You have low blood sugar often. Get help right away if:  You still have symptoms after you eat or drink something sugary.  Your blood sugar is at or below 54 mg/dL (3 mmol/L).  You have jerky movements that you cannot control.  You pass out.  These symptoms may be an emergency. Do not wait to see if the symptoms will go away. Get medical help right away. Call your local emergency services (911 in the [place] Do not drive yourself to the hospital. This information is not intended to replace advice given to you by your health care provider. Make sure you discuss any questions you have with your health care provider. Document Released: 10/11/2009 Document Revised: 12/23/2015 Document Reviewed: 08/20/2015 Elsevier Interactive Patient Education  [employer].

## 2017-02-09 NOTE — Progress Notes (Signed)
Pt glucose was 50 today, pt is asymptomatic pt was aware that it was low this am at home when he took it, he ate before coming in, and has eaten tomato soup, cheese, and sherbet in tx area. Per Dr [name] no repeat Glucose is needed here today, but pt to check CBG when he gets home. Pt aware and verbalized understanding.

## 2017-02-10 LAB — CANCER ANTIGEN 19-9: CA 19-9: 21 U/mL (ref 0–35)

## 2017-02-11 ENCOUNTER — Encounter: Payer: Self-pay | Admitting: Hematology

## 2017-02-16 ENCOUNTER — Ambulatory Visit: Payer: 59

## 2017-02-16 ENCOUNTER — Ambulatory Visit (HOSPITAL_BASED_OUTPATIENT_CLINIC_OR_DEPARTMENT_OTHER): Payer: 59

## 2017-02-16 ENCOUNTER — Other Ambulatory Visit (HOSPITAL_BASED_OUTPATIENT_CLINIC_OR_DEPARTMENT_OTHER): Payer: 59

## 2017-02-16 VITALS — BP 130/78 | HR 71 | Temp 98.3°F | Resp 18

## 2017-02-16 DIAGNOSIS — Z5111 Encounter for antineoplastic chemotherapy: Secondary | ICD-10-CM

## 2017-02-16 DIAGNOSIS — Z95828 Presence of other vascular implants and grafts: Secondary | ICD-10-CM

## 2017-02-16 DIAGNOSIS — C250 Malignant neoplasm of head of pancreas: Secondary | ICD-10-CM

## 2017-02-16 LAB — CBC WITH DIFFERENTIAL/PLATELET
BASO%: 1.9 % (ref 0.0–2.0)
Basophils Absolute: 0.1 10*3/uL (ref 0.0–0.1)
EOS%: 0.9 % (ref 0.0–7.0)
Eosinophils Absolute: 0 10*3/uL (ref 0.0–0.5)
HCT: 32.1 % — ABNORMAL LOW (ref 38.4–49.9)
HGB: 10.2 g/dL — ABNORMAL LOW (ref 13.0–17.1)
LYMPH%: 18.6 % (ref 14.0–49.0)
MCH: 26.6 pg — ABNORMAL LOW (ref 27.2–33.4)
MCHC: 31.8 g/dL — ABNORMAL LOW (ref 32.0–36.0)
MCV: 83.6 fL (ref 79.3–98.0)
MONO#: 0.5 10*3/uL (ref 0.1–0.9)
MONO%: 14.6 % — ABNORMAL HIGH (ref 0.0–14.0)
NEUT#: 2.1 10*3/uL (ref 1.5–6.5)
NEUT%: 64 % (ref 39.0–75.0)
Platelets: 305 10*3/uL (ref 140–400)
RBC: 3.84 10*6/uL — ABNORMAL LOW (ref 4.20–5.82)
RDW: 14.8 % — ABNORMAL HIGH (ref 11.0–14.6)
WBC: 3.2 10*3/uL — ABNORMAL LOW (ref 4.0–10.3)
lymph#: 0.6 10*3/uL — ABNORMAL LOW (ref 0.9–3.3)

## 2017-02-16 LAB — COMPREHENSIVE METABOLIC PANEL
ALT: 29 U/L (ref 0–55)
AST: 21 U/L (ref 5–34)
Albumin: 3.6 g/dL (ref 3.5–5.0)
Alkaline Phosphatase: 147 U/L (ref 40–150)
Anion Gap: 9 mEq/L (ref 3–11)
BUN: 8.5 mg/dL (ref 7.0–26.0)
CO2: 28 mEq/L (ref 22–29)
Calcium: 9.5 mg/dL (ref 8.4–10.4)
Chloride: 104 mEq/L (ref 98–109)
Creatinine: 0.7 mg/dL (ref 0.7–1.3)
EGFR: >90 mL/min/{1.73_m2} (ref >90)
Glucose: 59 mg/dl — ABNORMAL LOW (ref 70–140)
Potassium: 3.7 mEq/L (ref 3.5–5.1)
Sodium: 141 mEq/L (ref 136–145)
Total Bilirubin: 0.31 mg/dL (ref 0.20–1.20)
Total Protein: 7.5 g/dL (ref 6.4–8.3)

## 2017-02-16 MED ORDER — SODIUM CHLORIDE 0.9% FLUSH
10.0000 mL | INTRAVENOUS | Status: DC | PRN
  Administered 2017-02-16: 10 mL
  Filled 2017-02-16: qty 10

## 2017-02-16 MED ORDER — SODIUM CHLORIDE 0.9 % IV SOLN
Freq: Once | INTRAVENOUS | Status: AC
  Administered 2017-02-16: 09:00:00 via INTRAVENOUS

## 2017-02-16 MED ORDER — PROCHLORPERAZINE MALEATE 10 MG PO TABS
10.0000 mg | ORAL_TABLET | Freq: Once | ORAL | Status: AC
  Administered 2017-02-16: 10 mg via ORAL

## 2017-02-16 MED ORDER — PROCHLORPERAZINE MALEATE 10 MG PO TABS
ORAL_TABLET | ORAL | Status: AC
  Filled 2017-02-16: qty 1

## 2017-02-16 MED ORDER — GEMCITABINE HCL CHEMO INJECTION 1 GM/26.3ML
2000.0000 mg | Freq: Once | INTRAVENOUS | Status: AC
  Administered 2017-02-16: 2000 mg via INTRAVENOUS
  Filled 2017-02-16: qty 52.6

## 2017-02-16 MED ORDER — SODIUM CHLORIDE 0.9 % IJ SOLN
10.0000 mL | INTRAMUSCULAR | Status: DC | PRN
  Administered 2017-02-16: 10 mL via INTRAVENOUS
  Filled 2017-02-16: qty 10

## 2017-02-16 MED ORDER — HEPARIN SOD (PORK) LOCK FLUSH 100 UNIT/ML IV SOLN
500.0000 [IU] | Freq: Once | INTRAVENOUS | Status: AC | PRN
  Administered 2017-02-16: 500 [IU]
  Filled 2017-02-16: qty 5

## 2017-02-16 NOTE — Patient Instructions (Signed)
[facility] Discharge Instructions for Patients Receiving Chemotherapy  Today you received the following chemotherapy agents Gemzar  To help prevent nausea and vomiting after your treatment, we encourage you to take your nausea medication as directed.    If you develop nausea and vomiting that is not controlled by your nausea medication, call the clinic.   BELOW ARE SYMPTOMS THAT SHOULD BE REPORTED IMMEDIATELY:  *FEVER GREATER THAN 100.5 F  *CHILLS WITH OR WITHOUT FEVER  NAUSEA AND VOMITING THAT IS NOT CONTROLLED WITH YOUR NAUSEA MEDICATION  *UNUSUAL SHORTNESS OF BREATH  *UNUSUAL BRUISING OR BLEEDING  TENDERNESS IN MOUTH AND THROAT WITH OR WITHOUT PRESENCE OF ULCERS  *URINARY PROBLEMS  *BOWEL PROBLEMS  UNUSUAL RASH Items with * indicate a potential emergency and should be followed up as soon as possible.  Feel free to call the clinic you have any questions or concerns. The clinic phone number is [phone].  Please show the CHEMO ALERT CARD at check-in to the Emergency Department and triage nurse.   

## 2017-02-16 NOTE — Patient Instructions (Signed)

## 2017-03-01 NOTE — Progress Notes (Signed)
Colton Bush  [phone] Fax:([phone]) [phone]  Clinic Follow up Note   Patient Care Team: [name], MD as PCP - General 03/02/2017  CHIEF COMPLAINTS:  Follow up pancreatic cancer  Oncology History   Cancer Staging Adenocarcinoma of head of pancreas [facility]) Staging form: Pancreas, AJCC 7th Edition - Clinical stage from 03/09/2016: Stage IIB (T2, N1, M0) - Signed by Truitt Merle, MD on 03/17/2016 - Pathologic stage from 11/17/2016: Stage IB (yT2, N0, cM0) - Signed by Truitt Merle, MD on 12/10/2016       Adenocarcinoma of head of pancreas ([place])   02/27/2016 Imaging    CT chest, abdomen and pelvis with contrast showed ill-defined heterogeneity of pancreatic head, highly suspicious for malignancy, ) quit about and biliary ductal dilatation, mildly prominent lymph nodes in the upper abdomen, largest 2 cm in the portocaval . No other metastasis.       03/09/2016 Initial Diagnosis    Adenocarcinoma of head of pancreas ([place])      03/09/2016 Procedure    Upper EUS showed a 3.5 cm irregular mass in the pancreatic head, causing pancreatic and biliary duct obstruction. The mass involves the portal vein 422 mm, strongly suggesting invasion. There is suspicious nearby adenopathy      03/09/2016 Initial Biopsy    Fine-needle aspiration of the pancreatic mass from EUS showed malignant cells consistent with adenocarcinoma.       03/28/2016 - 05/24/2016 Chemotherapy    neoadjuvant chemo with FOLFIRINOX every 2 weeks, for 5 cycles       06/28/2016 - 08/10/2016 Radiation Therapy    Neoadjuvant radiation by Dr. [name] Site/dose:   Pancreas treated to 45 Gy in 25 fractions. The Pancreas was then boosted to 54 Gy in 5 fractions.      06/28/2016 - 08/10/2016 Chemotherapy    Xeloda 2000 mg in the morning and 1500 mg in the evening, on the day of radiation.      09/04/2016 Imaging    CT Chest Abdomen Pelvis IMPRESSION: 1. Poorly defined pancreatic head mass is grossly stable,  with associated pancreatic ductal dilatation and portacaval adenopathy. No associated vascular encasement. 2. Common bile duct stent in place with stable biliary ductal dilatation. Left hepatic lobe atrophy. 3. Hepatomegaly.  Spleen is at the upper limits normal in size. 4. Aortic atherosclerosis (ICD10-170.0). Coronary artery calcification.      09/19/2016 Surgery    Diagnostic laparoscopy and possible whipple procedure by Dr. Barry Dienes. Whipple procedure aborted due to the pancreatic cancer involving SMV, SMA and portal veins.      09/19/2016 Pathology Results    Two liver lesions were noted during diagnostic laparoscopy and biopsy revealed benign hepatic parenchyma with assoicated fibrosis with no evidence of malignancy.      10/06/2016 - 10/20/2016 Chemotherapy    Gemcitabine and Abraxane weekly, on day 1, 8, and 15 every 28 days, starting on 10/06/2016, held after1 cycle due to pending surgery at [facility]      10/31/2016 Imaging    CT CAP w contrast at [facility] 10/31/16 Impression: 1.Interval decrease in the size of the ill-defined pancreatic head mass. There is short segment abutment of the portal vein/superior mesenteric vein but no evidence of venous distortion. 2.No evidence of metastatic disease in the chest, abdomen and pelvis.      11/17/2016 -  Hospital Admission    Patient presents to hospital for nausea and vomitting      11/17/2016 Surgery    pancreatecomy, promximal subtotal with total  duodenectomy, partial  GASTRECTOMY, CHOLEDOCHOENTEROSTOMY AND GASTROJEJUNOSTOMY (WHIPPLE-TYPE PROCEDURE); WITH PANCREATOJEJUNOSTOMY      01/19/2017 -  Chemotherapy    Gemcitabine monotherapy: 1000mg /m2, d1,8 Q21d x2 cycles planned        HISTORY OF PRESENTING ILLNESS:  Colton Bush 61 y.o. male is here because of His newly diagnosed pancreatic cancer. He is accompanied by his wife to our multidisciplinary GI clinic today.  He noticed fatigue, anorexia and weight loss about 50bs in the past  4 months. It started when his endocrinologist changed his diabetic meds. He presented to hospital on 7/30 with jaundice. No pain or nausea. CT scan revealed a heterogeneous pancreatic head mass, highly suspicious for malignancy. Mild biliary and pancreatic duct dilatation, mildly prominent lymph nodes in the upper abdomen. He underwent ERCP and CBD stent placement, and subsequent upper EUS on 03/09/2016, which showed a 3.5 cm irregular mass in the pancreatic head, the mass involves the portal vein, no suspicious nearby adenopathy.  He has been feeling better after stent placement, with improved appetite and energy level. He feels normal again. No symptoms. BM is good, he has gained about 8lbs back.   CURRENT THERAPY: adjuvant Gemcitabine monotherapy: 1000mg /m2, d1,8 Q21d x2 cyles started 01/19/17   INTERIM HISTORY:  Mr. Colton Bush returns for follow up. He presents to the clinic today to start his last cycle. Following his last cycle, he denies any sickness, fatigue, nausea or diarrhea or hair loss. He would like to return to work for 4 days weekly.   He is still experiencing some drainage from his incision site. His wife changes his incision dressing daily and has some bloody discharge near the top of the incision. He sees the NP at [facility] in October.    MEDICAL HISTORY:  Past Medical History:  Diagnosis Date  . Arthritis    left hand  . Bronchitis 1977  . Cancer ([place]) 03/09/2016   pancreatic cancer  . Depression    takes Cymbalta daily  . Diabetes mellitus type II    sees [name]   . GERD (gastroesophageal reflux disease)    takes Omeprazole daily  . H/O hiatal hernia   . Hyperlipidemia    takes Zocor daily  . Hypertension    takes Amlodipine daily  . Neck pain    C4-7 stenosis and herniated disc  . Neuromuscular disorder ([place])    hiatal hernia  . Scoliosis    slight  . Spinal cord injury, C5-C7 ([place])    c4-c7  . Stiffness of hand joint    d/t cervical issues    SURGICAL  HISTORY: Past Surgical History:  Procedure Laterality Date  . ANTERIOR CERVICAL DECOMP/DISCECTOMY FUSION  08/18/2011   Procedure: ANTERIOR CERVICAL DECOMPRESSION/DISCECTOMY FUSION 3 LEVELS;  Surgeon: Winfield Cunas, MD;  Location: [place] [facility];  Service: Neurosurgery;  Laterality: N/A;  Anterior Cervical Four-Five/Five-Six/Six-Seven Decompression with Fusion, Plating, and Bonegraft  . CARPAL TUNNEL RELEASE  2013   bilateral, per Dr. Christella Noa   . COLONOSCOPY  10-30-14   per Dr. Olevia Perches, clear, repeat in 10 yrs   . egd with esophageal dilation  9-08   per Dr. Olevia Perches  . ERCP N/A 03/01/2016   Procedure: ENDOSCOPIC RETROGRADE CHOLANGIOPANCREATOGRAPHY (ERCP) with brushings and stent;  Surgeon: Doran Stabler, MD;  Location: [facility];  Service: Endoscopy;  Laterality: N/A;  . EUS N/A 03/09/2016   Procedure: ESOPHAGEAL ENDOSCOPIC ULTRASOUND (EUS) RADIAL;  Surgeon: Milus Banister, MD;  Location: [facility] ENDOSCOPY;  Service: Endoscopy;  Laterality: N/A;  . lymph nodes biopsy    . melanoma rt calf  1999  . PORTACATH PLACEMENT Left 03/22/2016   Procedure: INSERTION PORT-A-CATH;  Surgeon: Stark Klein, MD;  Location: [facility]S;  Service: General;  Laterality: Left;  . SPINE SURGERY    . TONSILLECTOMY     as a child  . ULNAR TUNNEL RELEASE  2013   right arm, per Dr. Christella Noa   . UPPER GASTROINTESTINAL ENDOSCOPY    . WHIPPLE PROCEDURE N/A 09/19/2016   Procedure: DIAGNOSTIC LAPAROSCOPY, LAPAROSCOPIC LIVER BIOPSY, RETROPERITONEAL EXPLORATION, INTRAOPERATIVE ULTRASOUND;  Surgeon: Stark Klein, MD;  Location: [place] OR;  Service: General;  Laterality: N/A;    SOCIAL HISTORY: Social History   Social History  . Marital status: Married    Spouse name: [name]  . Number of children: N/A  . Years of education: N/A   Occupational History  . Driver    Social History Main Topics  . Smoking status: Never Smoker  . Smokeless tobacco: Never Used     Comment: tried a pipe 35 years ago   . Alcohol use No  . Drug use:  No  . Sexual activity: Yes   Other Topics Concern  . Not on file   Social History Narrative   Married, wife Rosaria Ferries Nutritional therapist-   He works as a Geophysicist/field seismologist for a company  He has one son 28 yo. Lives with his wife   FAMILY HISTORY: Family History  Problem Relation Age of Onset  . Heart disease Father   . Heart disease Brother 83  . Anesthesia problems Mother   . Heart disease Mother   . Dementia Mother   . Diabetes Sister   . Stroke Sister   . Colon cancer Neg Hx   . Rectal cancer Neg Hx   . Stomach cancer Neg Hx     ALLERGIES:  is allergic to no known allergies.  MEDICATIONS:  Current Outpatient Prescriptions  Medication Sig Dispense Refill  . amLODipine (NORVASC) 10 MG tablet TAKE 1 TABLET BY MOUTH EVERY DAY 90 tablet 3  . benazepril (LOTENSIN) 10 MG tablet TAKE 1 TABLET (10 MG TOTAL) BY MOUTH DAILY. 30 tablet 2  . CREON 36000 units CPEP capsule Take 2 capsules by mouth 3 (three) times daily before meals. Plus 1 capsule prior to snacks  3  . FREESTYLE LITE test strip USE ONE STRIP TO CHECK GLUCOSE ONCE DAILY. PLEASE  SCHEDULE FOLLOW UP 50 each 0  . insulin aspart (NOVOLOG) 100 UNIT/ML injection Give every morning with breakfast and every evening with supper per the following sliding scale: give 3 units for glucose 150-175, give 6 units for glucose 176-200, give 9 units for glucose 201-250, give 12 units for glucose greater than 250 3 vial 11  . Insulin Detemir (LEVEMIR) 100 UNIT/ML Pen Inject 15 Units into the skin 2 (two) times daily. 90 mL 3  . Insulin Pen Needle 30G X 5 MM MISC Use one daily with insulin 100 each 2  . JARDIANCE 25 MG TABS tablet TAKE 1 TABLET BY MOUTH EVERY DAY 90 tablet 1  . Lancets (FREESTYLE) lancets USE AS INSTRUCTED TO CHECK BLOOD SUGAR ONCE A DAY 100 each 2  . lidocaine-prilocaine (EMLA) cream Apply to affected area once 30 g 3  . loratadine (CLARITIN) 10 MG tablet Take 10 mg by mouth daily.    Colton Bush LORazepam (ATIVAN) 0.5 MG tablet Take 1 tablet  (0.5 mg total) by mouth every 6 (six) hours as  needed (Nausea or vomiting). 30 tablet 0  . magnesium oxide (MAG-OX) 400 (241.3 Mg) MG tablet Take 1 tablet (400 mg total) by mouth daily. 90 tablet 1  . Multiple Vitamin (MULTIVITAMIN WITH MINERALS) TABS tablet Take 1 tablet by mouth daily.    . naproxen sodium (ANAPROX) 220 MG tablet Take 440 mg by mouth 2 (two) times daily as needed (for pain.).    Colton Bush Omega-3 Fatty Acids (FISH OIL) 1200 MG CAPS Take 2,400 mg by mouth daily.    Colton Bush omeprazole (PRILOSEC) 20 MG capsule TAKE ONE CAPSULE BY MOUTH ONCE DAILY 90 capsule 3  . ondansetron (ZOFRAN) 8 MG tablet Take 1 tablet (8 mg total) by mouth 2 (two) times daily as needed (Nausea or vomiting). 30 tablet 1  . potassium chloride SA (KLOR-CON M20) 20 MEQ tablet Take 1 tablet (20 mEq total) by mouth daily. 30 tablet 1  . prochlorperazine (COMPAZINE) 10 MG tablet Take 1 tablet (10 mg total) by mouth every 6 (six) hours as needed (Nausea or vomiting). 30 tablet 1  . traMADol (ULTRAM) 50 MG tablet Take 50 mg by mouth every 6 (six) hours as needed.    Colton Bush oxyCODONE (OXY IR/ROXICODONE) 5 MG immediate release tablet Take 1-2 tablets (5-10 mg total) by mouth every 4 (four) hours as needed for moderate pain, severe pain or breakthrough pain. (Patient not taking: Reported on 12/08/2016) 40 tablet 0   No current facility-administered medications for this visit.    Facility-Administered Medications Ordered in Other Visits  Medication Dose Route Frequency Provider Last Rate Last Dose  . gemcitabine (GEMZAR) 2,000 mg in sodium chloride 0.9 % 250 mL chemo infusion  2,000 mg Intravenous Once Truitt Merle, MD      . heparin lock flush 100 unit/mL  500 Units Intracatheter Once PRN Truitt Merle, MD      . sodium chloride 0.9 % injection 10 mL  10 mL Intravenous PRN Truitt Merle, MD   10 mL at 02/09/17 1111  . sodium chloride flush (NS) 0.9 % injection 10 mL  10 mL Intracatheter PRN Truitt Merle, MD        REVIEW OF SYSTEMS:  Constitutional:  Denies fevers, chills or abnormal night sweats  Eyes: Denies blurriness of vision, double vision or watery eyes Ears, nose, mouth, throat, and face: Denies mucositis or sore throat Respiratory: Denies cough, dyspnea or wheezes Cardiovascular: Denies palpitation, chest discomfort or lower extremity swelling Gastrointestinal:  Denies nausea, heartburn or change in bowel habits (+) draining/discharge from midline incision Skin: Denies abnormal skin rashes Lymphatics: Denies new lymphadenopathy or easy bruising Neurological:Denies numbness, tingling or new weaknesses Behavioral/Psych: Mood is stable, no new changes  All other systems were reviewed with the patient and are negative.  PHYSICAL EXAMINATION: ECOG PERFORMANCE STATUS: 1  Vitals:   03/02/17 1138  BP: 132/76  Pulse: 74  Resp: 18  Temp: 98.1 F (36.7 C)   Filed Weights   03/02/17 1138  Weight: 192 lb 1.6 oz (87.1 kg)    GENERAL:alert, no distress and comfortable.  SKIN: skin color, texture, turgor are normal, no rashes or significant lesions EYES: normal, conjunctiva are pink and non-injected, sclera clear OROPHARYNX:no exudate, no erythema and lips, buccal mucosa, and tongue normal  NECK: supple, thyroid normal size, non-tender, without nodularity LYMPH:  no palpable lymphadenopathy in the cervical, axillary or inguinal LUNGS: clear to auscultation and percussion with normal breathing effort HEART: regular rate & rhythm and no murmurs and no lower extremity edema ABDOMEN:abdomen soft, non-tender and  normal bowel sounds, midline surgical scar is healing, slight discharge in the top portion of incision. He has a small open wound. mild yellowish discharge/ 3-4 cm above umbilical  Musculoskeletal:no cyanosis of digits and no clubbing  PSYCH: alert & oriented x 3 with fluent speech NEURO: no focal motor/sensory deficits  LABORATORY DATA:  I have reviewed the data as listed CBC Latest Ref Rng & Units 03/02/2017 02/16/2017  02/09/2017  WBC 4.0 - 10.3 10e3/uL 7.6 3.2(L) 6.7  Hemoglobin 13.0 - 17.1 g/dL 10.3(L) 10.2(L) 10.0(L)  Hematocrit 38.4 - 49.9 % 31.5(L) 32.1(L) 30.8(L)  Platelets 140 - 400 10e3/uL 361 305 389   CMP Latest Ref Rng & Units 03/02/2017 02/16/2017 02/09/2017  Glucose 70 - 140 mg/dl 115 59(L) 50(L)  BUN 7.0 - 26.0 mg/dL 13.8 8.5 13.4  Creatinine 0.7 - 1.3 mg/dL 0.7 0.7 0.6(L)  Sodium 136 - 145 mEq/L 141 141 143  Potassium 3.5 - 5.1 mEq/L 3.8 3.7 3.5  Chloride 101 - 111 mmol/L - - -  CO2 22 - 29 mEq/L 28 28 27   Calcium 8.4 - 10.4 mg/dL 9.4 9.5 9.3  Total Protein 6.4 - 8.3 g/dL 7.2 7.5 7.2  Total Bilirubin 0.20 - 1.20 mg/dL 0.39 0.31 0.35  Alkaline Phos 40 - 150 U/L 121 147 123  AST 5 - 34 U/L 18 21 20   ALT 0 - 55 U/L 20 29 29     CA19.9 (0-35 U/ML)  02/28/2016: 1497 03/24/2016: 648 04/25/2016: 599 05/24/2016: 368 07/18/2016: 51 08/28/16: 55 10/06/16: 133 12/08/2016: 36 01/19/17: 36 02/07/17: 21   PATHOLOGY REPORT   Surgical Pathology 11/17/16 Dr. Mariah Milling  A. Head of pancreas, stomach, and duodenum, pancreatoduodenectomy (Whipple procedure) post oncologic treatment:  - Invasive moderately differentiated ductal adenocarcinoma (2.7 cm). - Perineural invasion is seen. - Twenty lymph nodes, negative for tumor (0/20). - Very minimal treatment response, if any. - Margins of resection are negative for tumor (2.5 mm to uncinate margin, 3 mm to bile duct and pancreatic neck margins).   B. Gallbladder and contents, cholecystectomy:  Chronic cholecystitis with acute mucosal inflammation and reactive atypia.   Synoptic Report PANCREAS (EXOCRINE)(Pancreas Exo - A)     Liver, biopsy 09/19/16 - BENIGN HEPATIC PARENCHYMA WITH ASSOCIATED FIBROSIS. - THERE IS NO EVIDENCE OF MALIGNANCY.  Diagnosis 03/09/2016 FINE NEEDLE ASPIRATION, ENDOSCOPIC, PANCREAS (SPECIMEN 1 OF 1 COLLECTED 03/09/16): MALIGNANT CELLS CONSISTENT WITH ADENOCARCINOMA. BACKGROUND NECROTIC DEBRIS AND ACUTE INFLAMMATION.  Diagnosis  03/01/2016 BILE DUCT BRUSHING(SPECIMEN 1 OF 1 COLLECTED 03/01/16): RARE ATYPICAL CELLS, SEE COMMENT.  RADIOGRAPHIC STUDIES: I have personally reviewed the radiological images as listed and agreed with the findings in the report. No results found.   CT CAP W WO CONTRAST 09/04/16 IMPRESSION: 1. Poorly defined pancreatic head mass is grossly stable, with associated pancreatic ductal dilatation and portacaval adenopathy. No associated vascular encasement. 2. Common bile duct stent in place with stable biliary ductal dilatation. Left hepatic lobe atrophy. 3. Hepatomegaly.  Spleen is at the upper limits normal in size. 4. Aortic atherosclerosis (ICD10-170.0). Coronary artery calcification.  DG CEST PORT 1 09/19/16 IMPRESSION: 1. Lines and tubes noted as above. 2. Mild bibasilar subsegmental atelectasis. 3. Cardiomegaly, no pulmonary venous congestion. 4. Free intraperitoneal air consistent with prior surgery.  EUS 03/09/2016 Dr. Ardis Hughs  - 3.5cm irregularly shaped, poorly defined mass in the pancreatic head. There is suspicious, nearby adenopathy. The mass is causing pancreatic and bililary duct obstruction (previously stented). The mass involves the portal vein for 62mm (abuttment and loss of usual tissue  interface), strongly suggesting invasion. Preliminary cytology results are positive for malignancy (adenocarcinoma), await final report.  ASSESSMENT & PLAN: 61 y.o. Caucasian male, with past medical history of diabetes and hypertension, presented with epigastric pain, weight loss, and jaundice.  1. Primary pancreatic adenocarcinoma, in pancreatic head, cT2N1M0, stage IIB, ypT2N0M0 -I have previously reviewed his CT abdomen and pelvis with contrast, EUS and biopsy findings with patient and his wife in great details. -His case was previously reviewed in our GI tumor board a few days ago. CT scan and US showed possible portal vein invasion from the pancreatic tumor, this is borderline resectable  disease. No evidence of distant metastasis.  -We previously reviewed the nature history of pancreatic cancer, and the overall survival rate with chemotherapy, surgery, and radiation. Patient and his wife was discouraged by the overall dismal long term survival rate (~20%)  -He received neoadjuvant chemotherapy with FOLFIRINOX and neoadjuvant chemoradiation, tolerated well  -He previously underwent diagnostic laparoscopy and possible whipple procedure on 09/19/2016 by Dr. Barry Dienes to attempt removal of the pancreatic tumor. The tumor appeared to be encasing the SMV/portal vein confluence with blurring of the interface. The tumor also appeared to be invading the superior mesenteric artery on the right lateral surface. This point, the procedure was aborted. -Dr. Mariah Milling at [facility] performed Whipple surgery on 11/17/16, he had complete resection  -I reviewed his surgical pathology findings, which showed a T2 tumor, surgical margins were negative, all lymph nodes were negative. - He is recovering well after surgery. I encouraged him to try to eat more, try to gain a little weight and be active.  - I again discussed the role  Adjuvant chemotherapy. He received about 4-1/2 months of neoadjuvant chemotherapy, I recommend an additional 9 weeks of gemcitabine.Once a week with 2 weeks on, 1 week off, for 3 cycles  -The goal of care is curative. -He started Gemcitabine 01/19/17. He will begin his last cycle today (8/3) and complete next week  - repeat CT scan following completion of chemo. He will get it done at [facility] in October. He will provide a copy for me -His tumor marker CA 19.9 has came down to normal, which is a good sign. No clinical concern for recurrence right now. - he would like to return to work part time. I will write a letter to return to work and include no heavy lifting due to his incompletely healed incision site - labs reviewed. Kidney and liver functions are normal today. He is slightly anemic. Will  proceed chemo   2. Type 2 DM - he will continue medication and follow up with his primary care physician Dr. Sarajane Jews  -We previously reviewed that his blood glucose will need to be monitored closely during the chemotherapy, and his medication may need to be adjusted  -His blood glucose has been fluctuating lately, he sees a diabetic specialist and now, and checking his finger glucose 3 times a day, better controlled lately. -  I previously advised the patient to continue watching his blood glucose levels. - I previously recommended Glucerna as a nutritional supplement due to lower amount of carbohydrate  3. HTN -He is on amlodipine and benazepril  -His blood pressure is normal today. -The previously discussed chemotherapy may affect his blood pressure, and he is medication may need to be adjusted  4. Anemia secondary to surgery and chemotherapy -Continue monitoring, -Consider blood transfusion if hemoglobin less than 8  5. Open abdominal wound  -He will continue daily dressing changes at  home -Follow up at at [facility] surgical clinic in October.  Plan - continue Gemicitabine, last cycle, Day 1 today and D8 next week  - write letter to return to work - lab, flush and follow up in 7 weeks  All questions were answered. The patient knows to call the clinic with any problems, questions or concerns.  I spent 25 minutes counseling the patient face to face. The total time spent in the appointment was 30 minutes and more than 50% was on counseling.   This document serves as a record of services personally performed by Truitt Merle, MD. It was created on her behalf by Brandt Loosen, a trained medical scribe. The creation of this record is based on the scribe's personal observations and the provider's statements to them. This document has been checked and approved by the attending provider.

## 2017-03-02 ENCOUNTER — Ambulatory Visit (HOSPITAL_BASED_OUTPATIENT_CLINIC_OR_DEPARTMENT_OTHER): Payer: 59 | Admitting: Hematology

## 2017-03-02 ENCOUNTER — Ambulatory Visit: Payer: 59

## 2017-03-02 ENCOUNTER — Telehealth: Payer: Self-pay | Admitting: Hematology

## 2017-03-02 ENCOUNTER — Encounter: Payer: Self-pay | Admitting: Hematology

## 2017-03-02 ENCOUNTER — Ambulatory Visit (HOSPITAL_BASED_OUTPATIENT_CLINIC_OR_DEPARTMENT_OTHER): Payer: 59

## 2017-03-02 ENCOUNTER — Other Ambulatory Visit (HOSPITAL_BASED_OUTPATIENT_CLINIC_OR_DEPARTMENT_OTHER): Payer: 59

## 2017-03-02 VITALS — BP 132/76 | HR 74 | Temp 98.1°F | Resp 18 | Ht 70.0 in | Wt 192.1 lb

## 2017-03-02 DIAGNOSIS — C250 Malignant neoplasm of head of pancreas: Secondary | ICD-10-CM

## 2017-03-02 DIAGNOSIS — E1165 Type 2 diabetes mellitus with hyperglycemia: Secondary | ICD-10-CM | POA: Diagnosis not present

## 2017-03-02 DIAGNOSIS — Z794 Long term (current) use of insulin: Secondary | ICD-10-CM

## 2017-03-02 DIAGNOSIS — I10 Essential (primary) hypertension: Secondary | ICD-10-CM | POA: Diagnosis not present

## 2017-03-02 DIAGNOSIS — Z5111 Encounter for antineoplastic chemotherapy: Secondary | ICD-10-CM

## 2017-03-02 DIAGNOSIS — Z95828 Presence of other vascular implants and grafts: Secondary | ICD-10-CM

## 2017-03-02 DIAGNOSIS — IMO0001 Reserved for inherently not codable concepts without codable children: Secondary | ICD-10-CM

## 2017-03-02 LAB — CBC WITH DIFFERENTIAL/PLATELET
BASO%: 0.9 % (ref 0.0–2.0)
Basophils Absolute: 0.1 10*3/uL (ref 0.0–0.1)
EOS%: 3 % (ref 0.0–7.0)
Eosinophils Absolute: 0.2 10*3/uL (ref 0.0–0.5)
HCT: 31.5 % — ABNORMAL LOW (ref 38.4–49.9)
HGB: 10.3 g/dL — ABNORMAL LOW (ref 13.0–17.1)
LYMPH%: 7.6 % — ABNORMAL LOW (ref 14.0–49.0)
MCH: 26.7 pg — ABNORMAL LOW (ref 27.2–33.4)
MCHC: 32.6 g/dL (ref 32.0–36.0)
MCV: 81.8 fL (ref 79.3–98.0)
MONO#: 0.7 10*3/uL (ref 0.1–0.9)
MONO%: 9.9 % (ref 0.0–14.0)
NEUT#: 5.9 10*3/uL (ref 1.5–6.5)
NEUT%: 78.6 % — ABNORMAL HIGH (ref 39.0–75.0)
Platelets: 361 10*3/uL (ref 140–400)
RBC: 3.86 10*6/uL — ABNORMAL LOW (ref 4.20–5.82)
RDW: 17.4 % — ABNORMAL HIGH (ref 11.0–14.6)
WBC: 7.6 10*3/uL (ref 4.0–10.3)
lymph#: 0.6 10*3/uL — ABNORMAL LOW (ref 0.9–3.3)

## 2017-03-02 LAB — COMPREHENSIVE METABOLIC PANEL
ALT: 20 U/L (ref 0–55)
AST: 18 U/L (ref 5–34)
Albumin: 3.7 g/dL (ref 3.5–5.0)
Alkaline Phosphatase: 121 U/L (ref 40–150)
Anion Gap: 8 mEq/L (ref 3–11)
BUN: 13.8 mg/dL (ref 7.0–26.0)
CO2: 28 mEq/L (ref 22–29)
Calcium: 9.4 mg/dL (ref 8.4–10.4)
Chloride: 105 mEq/L (ref 98–109)
Creatinine: 0.7 mg/dL (ref 0.7–1.3)
EGFR: >90 mL/min/{1.73_m2} (ref >90)
Glucose: 115 mg/dl (ref 70–140)
Potassium: 3.8 mEq/L (ref 3.5–5.1)
Sodium: 141 mEq/L (ref 136–145)
Total Bilirubin: 0.39 mg/dL (ref 0.20–1.20)
Total Protein: 7.2 g/dL (ref 6.4–8.3)

## 2017-03-02 MED ORDER — HEPARIN SOD (PORK) LOCK FLUSH 100 UNIT/ML IV SOLN
500.0000 [IU] | Freq: Once | INTRAVENOUS | Status: AC | PRN
  Administered 2017-03-02: 500 [IU]
  Filled 2017-03-02: qty 5

## 2017-03-02 MED ORDER — SODIUM CHLORIDE 0.9% FLUSH
10.0000 mL | INTRAVENOUS | Status: DC | PRN
  Administered 2017-03-02: 10 mL
  Filled 2017-03-02: qty 10

## 2017-03-02 MED ORDER — SODIUM CHLORIDE 0.9 % IJ SOLN
10.0000 mL | INTRAMUSCULAR | Status: DC | PRN
  Administered 2017-03-02: 10 mL via INTRAVENOUS
  Filled 2017-03-02: qty 10

## 2017-03-02 MED ORDER — SODIUM CHLORIDE 0.9 % IV SOLN
Freq: Once | INTRAVENOUS | Status: AC
  Administered 2017-03-02: 13:00:00 via INTRAVENOUS

## 2017-03-02 MED ORDER — SODIUM CHLORIDE 0.9 % IV SOLN
2000.0000 mg | Freq: Once | INTRAVENOUS | Status: AC
  Administered 2017-03-02: 2000 mg via INTRAVENOUS
  Filled 2017-03-02: qty 52.6

## 2017-03-02 MED ORDER — PROCHLORPERAZINE MALEATE 10 MG PO TABS
ORAL_TABLET | ORAL | Status: AC
  Filled 2017-03-02: qty 1

## 2017-03-02 MED ORDER — PROCHLORPERAZINE MALEATE 10 MG PO TABS
10.0000 mg | ORAL_TABLET | Freq: Once | ORAL | Status: AC
  Administered 2017-03-02: 10 mg via ORAL

## 2017-03-02 NOTE — Patient Instructions (Signed)
[facility] Discharge Instructions for Patients Receiving Chemotherapy  Today you received the following chemotherapy agents Gemzar  To help prevent nausea and vomiting after your treatment, we encourage you to take your nausea medication as needed   If you develop nausea and vomiting that is not controlled by your nausea medication, call the clinic.   BELOW ARE SYMPTOMS THAT SHOULD BE REPORTED IMMEDIATELY:  *FEVER GREATER THAN 100.5 F  *CHILLS WITH OR WITHOUT FEVER  NAUSEA AND VOMITING THAT IS NOT CONTROLLED WITH YOUR NAUSEA MEDICATION  *UNUSUAL SHORTNESS OF BREATH  *UNUSUAL BRUISING OR BLEEDING  TENDERNESS IN MOUTH AND THROAT WITH OR WITHOUT PRESENCE OF ULCERS  *URINARY PROBLEMS  *BOWEL PROBLEMS  UNUSUAL RASH Items with * indicate a potential emergency and should be followed up as soon as possible.  Feel free to call the clinic you have any questions or concerns. The clinic phone number is [phone].  Please show the CHEMO ALERT CARD at check-in to the Emergency Department and triage nurse.   

## 2017-03-02 NOTE — Telephone Encounter (Signed)
Scheduled appt per 8/3 los - Gave patient AVS and calender per los.  

## 2017-03-02 NOTE — Patient Instructions (Signed)

## 2017-03-03 LAB — CANCER ANTIGEN 19-9: CA 19-9: 26 U/mL (ref 0–35)

## 2017-03-09 ENCOUNTER — Ambulatory Visit: Payer: 59

## 2017-03-09 ENCOUNTER — Other Ambulatory Visit (HOSPITAL_BASED_OUTPATIENT_CLINIC_OR_DEPARTMENT_OTHER): Payer: PRIVATE HEALTH INSURANCE

## 2017-03-09 ENCOUNTER — Ambulatory Visit (HOSPITAL_BASED_OUTPATIENT_CLINIC_OR_DEPARTMENT_OTHER): Payer: 59

## 2017-03-09 VITALS — BP 129/70 | HR 72 | Temp 98.0°F | Resp 18

## 2017-03-09 DIAGNOSIS — Z5111 Encounter for antineoplastic chemotherapy: Secondary | ICD-10-CM | POA: Diagnosis not present

## 2017-03-09 DIAGNOSIS — C250 Malignant neoplasm of head of pancreas: Secondary | ICD-10-CM

## 2017-03-09 DIAGNOSIS — Z95828 Presence of other vascular implants and grafts: Secondary | ICD-10-CM

## 2017-03-09 LAB — COMPREHENSIVE METABOLIC PANEL
ALT: 26 U/L (ref 0–55)
AST: 23 U/L (ref 5–34)
Albumin: 3.7 g/dL (ref 3.5–5.0)
Alkaline Phosphatase: 128 U/L (ref 40–150)
Anion Gap: 6 mEq/L (ref 3–11)
BUN: 13.6 mg/dL (ref 7.0–26.0)
CO2: 30 mEq/L — ABNORMAL HIGH (ref 22–29)
Calcium: 9.9 mg/dL (ref 8.4–10.4)
Chloride: 103 mEq/L (ref 98–109)
Creatinine: 0.7 mg/dL (ref 0.7–1.3)
EGFR: >90 mL/min/{1.73_m2} (ref >90)
Glucose: 123 mg/dl (ref 70–140)
Potassium: 3.7 mEq/L (ref 3.5–5.1)
Sodium: 139 mEq/L (ref 136–145)
Total Bilirubin: 0.37 mg/dL (ref 0.20–1.20)
Total Protein: 7.6 g/dL (ref 6.4–8.3)

## 2017-03-09 LAB — CBC WITH DIFFERENTIAL/PLATELET
BASO%: 1.4 % (ref 0.0–2.0)
Basophils Absolute: 0.1 10*3/uL (ref 0.0–0.1)
EOS%: 2.3 % (ref 0.0–7.0)
Eosinophils Absolute: 0.1 10*3/uL (ref 0.0–0.5)
HCT: 30.9 % — ABNORMAL LOW (ref 38.4–49.9)
HGB: 9.9 g/dL — ABNORMAL LOW (ref 13.0–17.1)
LYMPH%: 17.5 % (ref 14.0–49.0)
MCH: 26.7 pg — ABNORMAL LOW (ref 27.2–33.4)
MCHC: 32 g/dL (ref 32.0–36.0)
MCV: 83.3 fL (ref 79.3–98.0)
MONO#: 0.5 10*3/uL (ref 0.1–0.9)
MONO%: 13.5 % (ref 0.0–14.0)
NEUT#: 2.3 10*3/uL (ref 1.5–6.5)
NEUT%: 65.3 % (ref 39.0–75.0)
Platelets: 312 10*3/uL (ref 140–400)
RBC: 3.71 10*6/uL — ABNORMAL LOW (ref 4.20–5.82)
RDW: 16.6 % — ABNORMAL HIGH (ref 11.0–14.6)
WBC: 3.5 10*3/uL — ABNORMAL LOW (ref 4.0–10.3)
lymph#: 0.6 10*3/uL — ABNORMAL LOW (ref 0.9–3.3)

## 2017-03-09 MED ORDER — SODIUM CHLORIDE 0.9 % IJ SOLN
10.0000 mL | INTRAMUSCULAR | Status: DC | PRN
  Administered 2017-03-09: 10 mL via INTRAVENOUS
  Filled 2017-03-09: qty 10

## 2017-03-09 MED ORDER — SODIUM CHLORIDE 0.9 % IV SOLN
Freq: Once | INTRAVENOUS | Status: AC
  Administered 2017-03-09: 12:00:00 via INTRAVENOUS

## 2017-03-09 MED ORDER — SODIUM CHLORIDE 0.9 % IV SOLN
2000.0000 mg | Freq: Once | INTRAVENOUS | Status: AC
  Administered 2017-03-09: 2000 mg via INTRAVENOUS
  Filled 2017-03-09: qty 52.6

## 2017-03-09 MED ORDER — SODIUM CHLORIDE 0.9% FLUSH
10.0000 mL | INTRAVENOUS | Status: DC | PRN
  Administered 2017-03-09: 10 mL
  Filled 2017-03-09: qty 10

## 2017-03-09 MED ORDER — PROCHLORPERAZINE MALEATE 10 MG PO TABS
10.0000 mg | ORAL_TABLET | Freq: Once | ORAL | Status: AC
  Administered 2017-03-09: 10 mg via ORAL

## 2017-03-09 MED ORDER — HEPARIN SOD (PORK) LOCK FLUSH 100 UNIT/ML IV SOLN
500.0000 [IU] | Freq: Once | INTRAVENOUS | Status: AC | PRN
  Administered 2017-03-09: 500 [IU]
  Filled 2017-03-09: qty 5

## 2017-03-09 MED ORDER — PROCHLORPERAZINE MALEATE 10 MG PO TABS
ORAL_TABLET | ORAL | Status: AC
  Filled 2017-03-09: qty 1

## 2017-03-09 NOTE — Patient Instructions (Signed)
[facility] Discharge Instructions for Patients Receiving Chemotherapy  Today you received the following chemotherapy agents Gemzar  To help prevent nausea and vomiting after your treatment, we encourage you to take your nausea medication as needed   If you develop nausea and vomiting that is not controlled by your nausea medication, call the clinic.   BELOW ARE SYMPTOMS THAT SHOULD BE REPORTED IMMEDIATELY:  *FEVER GREATER THAN 100.5 F  *CHILLS WITH OR WITHOUT FEVER  NAUSEA AND VOMITING THAT IS NOT CONTROLLED WITH YOUR NAUSEA MEDICATION  *UNUSUAL SHORTNESS OF BREATH  *UNUSUAL BRUISING OR BLEEDING  TENDERNESS IN MOUTH AND THROAT WITH OR WITHOUT PRESENCE OF ULCERS  *URINARY PROBLEMS  *BOWEL PROBLEMS  UNUSUAL RASH Items with * indicate a potential emergency and should be followed up as soon as possible.  Feel free to call the clinic you have any questions or concerns. The clinic phone number is [phone].  Please show the CHEMO ALERT CARD at check-in to the Emergency Department and triage nurse.   

## 2017-04-04 ENCOUNTER — Other Ambulatory Visit: Payer: Self-pay | Admitting: Hematology

## 2017-04-17 ENCOUNTER — Other Ambulatory Visit: Payer: Self-pay | Admitting: Family Medicine

## 2017-04-19 ENCOUNTER — Encounter: Payer: Self-pay | Admitting: Family Medicine

## 2017-04-20 ENCOUNTER — Ambulatory Visit (HOSPITAL_BASED_OUTPATIENT_CLINIC_OR_DEPARTMENT_OTHER): Payer: PRIVATE HEALTH INSURANCE | Admitting: Hematology

## 2017-04-20 ENCOUNTER — Ambulatory Visit (HOSPITAL_BASED_OUTPATIENT_CLINIC_OR_DEPARTMENT_OTHER): Payer: PRIVATE HEALTH INSURANCE

## 2017-04-20 ENCOUNTER — Telehealth: Payer: Self-pay | Admitting: Hematology

## 2017-04-20 ENCOUNTER — Other Ambulatory Visit (HOSPITAL_BASED_OUTPATIENT_CLINIC_OR_DEPARTMENT_OTHER): Payer: PRIVATE HEALTH INSURANCE

## 2017-04-20 ENCOUNTER — Other Ambulatory Visit: Payer: Self-pay

## 2017-04-20 ENCOUNTER — Other Ambulatory Visit: Payer: Self-pay | Admitting: Family Medicine

## 2017-04-20 VITALS — BP 121/71 | HR 70 | Temp 98.2°F | Resp 18 | Ht 70.0 in | Wt 194.3 lb

## 2017-04-20 DIAGNOSIS — E119 Type 2 diabetes mellitus without complications: Secondary | ICD-10-CM

## 2017-04-20 DIAGNOSIS — Z5111 Encounter for antineoplastic chemotherapy: Secondary | ICD-10-CM

## 2017-04-20 DIAGNOSIS — Z794 Long term (current) use of insulin: Secondary | ICD-10-CM | POA: Diagnosis not present

## 2017-04-20 DIAGNOSIS — I10 Essential (primary) hypertension: Secondary | ICD-10-CM

## 2017-04-20 DIAGNOSIS — C250 Malignant neoplasm of head of pancreas: Secondary | ICD-10-CM

## 2017-04-20 DIAGNOSIS — D6481 Anemia due to antineoplastic chemotherapy: Secondary | ICD-10-CM

## 2017-04-20 DIAGNOSIS — Z95828 Presence of other vascular implants and grafts: Secondary | ICD-10-CM

## 2017-04-20 DIAGNOSIS — IMO0001 Reserved for inherently not codable concepts without codable children: Secondary | ICD-10-CM

## 2017-04-20 DIAGNOSIS — E1165 Type 2 diabetes mellitus with hyperglycemia: Secondary | ICD-10-CM

## 2017-04-20 LAB — COMPREHENSIVE METABOLIC PANEL
ALT: 30 U/L (ref 0–55)
AST: 20 U/L (ref 5–34)
Albumin: 3.8 g/dL (ref 3.5–5.0)
Alkaline Phosphatase: 116 U/L (ref 40–150)
Anion Gap: 8 mEq/L (ref 3–11)
BUN: 20.7 mg/dL (ref 7.0–26.0)
CO2: 28 mEq/L (ref 22–29)
Calcium: 9.6 mg/dL (ref 8.4–10.4)
Chloride: 104 mEq/L (ref 98–109)
Creatinine: 0.8 mg/dL (ref 0.7–1.3)
EGFR: >90 mL/min/{1.73_m2} (ref >90)
Glucose: 114 mg/dl (ref 70–140)
Potassium: 3.8 mEq/L (ref 3.5–5.1)
Sodium: 141 mEq/L (ref 136–145)
Total Bilirubin: 0.46 mg/dL (ref 0.20–1.20)
Total Protein: 7.8 g/dL (ref 6.4–8.3)

## 2017-04-20 LAB — CBC WITH DIFFERENTIAL/PLATELET
BASO%: 1.2 % (ref 0.0–2.0)
Basophils Absolute: 0.1 10*3/uL (ref 0.0–0.1)
EOS%: 3.6 % (ref 0.0–7.0)
Eosinophils Absolute: 0.3 10*3/uL (ref 0.0–0.5)
HCT: 32.5 % — ABNORMAL LOW (ref 38.4–49.9)
HGB: 10.5 g/dL — ABNORMAL LOW (ref 13.0–17.1)
LYMPH%: 9.9 % — ABNORMAL LOW (ref 14.0–49.0)
MCH: 26.2 pg — ABNORMAL LOW (ref 27.2–33.4)
MCHC: 32.2 g/dL (ref 32.0–36.0)
MCV: 81.4 fL (ref 79.3–98.0)
MONO#: 0.6 10*3/uL (ref 0.1–0.9)
MONO%: 7.5 % (ref 0.0–14.0)
NEUT#: 5.9 10*3/uL (ref 1.5–6.5)
NEUT%: 77.8 % — ABNORMAL HIGH (ref 39.0–75.0)
Platelets: 315 10*3/uL (ref 140–400)
RBC: 3.99 10*6/uL — ABNORMAL LOW (ref 4.20–5.82)
RDW: 16.9 % — ABNORMAL HIGH (ref 11.0–14.6)
WBC: 7.6 10*3/uL (ref 4.0–10.3)
lymph#: 0.8 10*3/uL — ABNORMAL LOW (ref 0.9–3.3)

## 2017-04-20 MED ORDER — HEPARIN SOD (PORK) LOCK FLUSH 100 UNIT/ML IV SOLN
500.0000 [IU] | Freq: Once | INTRAVENOUS | Status: AC | PRN
  Administered 2017-04-20: 500 [IU] via INTRAVENOUS
  Filled 2017-04-20: qty 5

## 2017-04-20 MED ORDER — SODIUM CHLORIDE 0.9 % IJ SOLN
10.0000 mL | INTRAMUSCULAR | Status: DC | PRN
  Administered 2017-04-20: 10 mL via INTRAVENOUS
  Filled 2017-04-20: qty 10

## 2017-04-20 NOTE — Progress Notes (Signed)
[place]  [phone] [phone]  Clinic Follow up Note   Patient Care Team: [name], MD as PCP - General 04/20/2017  CHIEF COMPLAINTS:  Follow up pancreatic cancer  Oncology History   Cancer Staging Adenocarcinoma of head of pancreas [facility]) Staging form: Pancreas, AJCC 7th Edition - Clinical stage from 03/09/2016: Stage IIB (T2, N1, M0) - Signed by Truitt Merle, MD on 03/17/2016 - Pathologic stage from 11/17/2016: Stage IB (yT2, N0, cM0) - Signed by Truitt Merle, MD on 12/10/2016       Adenocarcinoma of head of pancreas ([place])   02/27/2016 Imaging    CT chest, abdomen and pelvis with contrast showed ill-defined heterogeneity of pancreatic head, highly suspicious for malignancy, ) quit about and biliary ductal dilatation, mildly prominent lymph nodes in the upper abdomen, largest 2 cm in the portocaval . No other metastasis.       03/09/2016 Initial Diagnosis    Adenocarcinoma of head of pancreas ([place])      03/09/2016 Procedure    Upper EUS showed a 3.5 cm irregular mass in the pancreatic head, causing pancreatic and biliary duct obstruction. The mass involves the portal vein 422 mm, strongly suggesting invasion. There is suspicious nearby adenopathy      03/09/2016 Initial Biopsy    Fine-needle aspiration of the pancreatic mass from EUS showed malignant cells consistent with adenocarcinoma.       03/28/2016 - 05/24/2016 Chemotherapy    neoadjuvant chemo with FOLFIRINOX every 2 weeks, for 5 cycles       06/28/2016 - 08/10/2016 Radiation Therapy    Neoadjuvant radiation by Dr. [name] Site/dose:   Pancreas treated to 45 Gy in 25 fractions. The Pancreas was then boosted to 54 Gy in 5 fractions.      06/28/2016 - 08/10/2016 Chemotherapy    Xeloda 2000 mg in the morning and 1500 mg in the evening, on the day of radiation.      09/04/2016 Imaging    CT Chest Abdomen Pelvis IMPRESSION: 1. Poorly defined pancreatic head mass is grossly stable,  with associated pancreatic ductal dilatation and portacaval adenopathy. No associated vascular encasement. 2. Common bile duct stent in place with stable biliary ductal dilatation. Left hepatic lobe atrophy. 3. Hepatomegaly.  Spleen is at the upper limits normal in size. 4. Aortic atherosclerosis (ICD10-170.0). Coronary artery calcification.      09/19/2016 Surgery    Diagnostic laparoscopy and possible whipple procedure by Dr. Barry Dienes. Whipple procedure aborted due to the pancreatic cancer involving SMV, SMA and portal veins.      09/19/2016 Pathology Results    Two liver lesions were noted during diagnostic laparoscopy and biopsy revealed benign hepatic parenchyma with assoicated fibrosis with no evidence of malignancy.      10/06/2016 - 10/20/2016 Chemotherapy    Gemcitabine and Abraxane weekly, on day 1, 8, and 15 every 28 days, starting on 10/06/2016, held after1 cycle due to pending surgery at [facility]      10/31/2016 Imaging    CT CAP w contrast at [facility] 10/31/16 Impression: 1.Interval decrease in the size of the ill-defined pancreatic head mass. There is short segment abutment of the portal vein/superior mesenteric vein but no evidence of venous distortion. 2.No evidence of metastatic disease in the chest, abdomen and pelvis.      11/17/2016 -  Hospital Admission    Patient presents to hospital for nausea and vomitting      11/17/2016 Surgery    pancreatecomy, promximal subtotal with total  duodenectomy, partial  GASTRECTOMY, CHOLEDOCHOENTEROSTOMY AND GASTROJEJUNOSTOMY (WHIPPLE-TYPE PROCEDURE); WITH PANCREATOJEJUNOSTOMY      01/19/2017 - 03/02/2017 Chemotherapy    Gemcitabine monotherapy: 1000mg /m2, d1,8 Q21d x2 cycles planned        HISTORY OF PRESENTING ILLNESS:  Colton Bush 61 y.o. male is here because of His newly diagnosed pancreatic cancer. He is accompanied by his wife to our multidisciplinary GI clinic today.  He noticed fatigue, anorexia and weight loss about 50bs in  the past 4 months. It started when his endocrinologist changed his diabetic meds. He presented to hospital on 7/30 with jaundice. No pain or nausea. CT scan revealed a heterogeneous pancreatic head mass, highly suspicious for malignancy. Mild biliary and pancreatic duct dilatation, mildly prominent lymph nodes in the upper abdomen. He underwent ERCP and CBD stent placement, and subsequent upper EUS on 03/09/2016, which showed a 3.5 cm irregular mass in the pancreatic head, the mass involves the portal vein, no suspicious nearby adenopathy.  He has been feeling better after stent placement, with improved appetite and energy level. He feels normal again. No symptoms. BM is good, he has gained about 8lbs back.   CURRENT THERAPY: Surveillance   INTERIM HISTORY:   Mr. Lesch returns for follow up. He has recovered well from chemotherapy. His surgical incision wound is not completely healed, still has small area of pinkish granular tissue. He will go back to [facility] next month for follow up.     He is not taking pain or nausea medication. He has no other concerns. His BM are good and no leg swelling. He is gaining his weigh tback  He walks sometimes around the house and does no yard work. Overall he is still recovering and taking his time off from work.      MEDICAL HISTORY:  Past Medical History:  Diagnosis Date  . Arthritis    left hand  . Bronchitis 1977  . Cancer ([place]) 03/09/2016   pancreatic cancer  . Depression    takes Cymbalta daily  . Diabetes mellitus type II    sees [name]   . GERD (gastroesophageal reflux disease)    takes Omeprazole daily  . H/O hiatal hernia   . Hyperlipidemia    takes Zocor daily  . Hypertension    takes Amlodipine daily  . Neck pain    C4-7 stenosis and herniated disc  . Neuromuscular disorder ([place])    hiatal hernia  . Scoliosis    slight  . Spinal cord injury, C5-C7 ([place])    c4-c7  . Stiffness of hand joint    d/t cervical issues    SURGICAL  HISTORY: Past Surgical History:  Procedure Laterality Date  . ANTERIOR CERVICAL DECOMP/DISCECTOMY FUSION  08/18/2011   Procedure: ANTERIOR CERVICAL DECOMPRESSION/DISCECTOMY FUSION 3 LEVELS;  Surgeon: Winfield Cunas, MD;  Location: [place] [facility];  Service: Neurosurgery;  Laterality: N/A;  Anterior Cervical Four-Five/Five-Six/Six-Seven Decompression with Fusion, Plating, and Bonegraft  . CARPAL TUNNEL RELEASE  2013   bilateral, per Dr. Christella Noa   . COLONOSCOPY  10-30-14   per Dr. Olevia Perches, clear, repeat in 10 yrs   . egd with esophageal dilation  9-08   per Dr. Olevia Perches  . ERCP N/A 03/01/2016   Procedure: ENDOSCOPIC RETROGRADE CHOLANGIOPANCREATOGRAPHY (ERCP) with brushings and stent;  Surgeon: Doran Stabler, MD;  Location: [facility];  Service: Endoscopy;  Laterality: N/A;  . EUS N/A 03/09/2016   Procedure: ESOPHAGEAL ENDOSCOPIC ULTRASOUND (EUS) RADIAL;  Surgeon: Milus Banister, MD;  Location: [facility];  Service: Endoscopy;  Laterality: N/A;  . lymph nodes biopsy    . melanoma rt calf  1999  . PORTACATH PLACEMENT Left 03/22/2016   Procedure: INSERTION PORT-A-CATH;  Surgeon: Stark Klein, MD;  Location: [facility]S;  Service: General;  Laterality: Left;  . SPINE SURGERY    . TONSILLECTOMY     as a child  . ULNAR TUNNEL RELEASE  2013   right arm, per Dr. Christella Noa   . UPPER GASTROINTESTINAL ENDOSCOPY    . WHIPPLE PROCEDURE N/A 09/19/2016   Procedure: DIAGNOSTIC LAPAROSCOPY, LAPAROSCOPIC LIVER BIOPSY, RETROPERITONEAL EXPLORATION, INTRAOPERATIVE ULTRASOUND;  Surgeon: Stark Klein, MD;  Location: [place] OR;  Service: General;  Laterality: N/A;    SOCIAL HISTORY: Social History   Social History  . Marital status: Married    Spouse name: [name]  . Number of children: N/A  . Years of education: N/A   Occupational History  . Driver    Social History Main Topics  . Smoking status: Never Smoker  . Smokeless tobacco: Never Used     Comment: tried a pipe 35 years ago   . Alcohol use No  . Drug use:  No  . Sexual activity: Yes   Other Topics Concern  . Not on file   Social History Narrative   Married, wife Rosaria Ferries Nutritional therapist-   He works as a Geophysicist/field seismologist for a company  He has one son 22 yo. Lives with his wife   FAMILY HISTORY: Family History  Problem Relation Age of Onset  . Heart disease Father   . Heart disease Brother 19  . Anesthesia problems Mother   . Heart disease Mother   . Dementia Mother   . Diabetes Sister   . Stroke Sister   . Colon cancer Neg Hx   . Rectal cancer Neg Hx   . Stomach cancer Neg Hx     ALLERGIES:  is allergic to no known allergies.  MEDICATIONS:  Current Outpatient Prescriptions  Medication Sig Dispense Refill  . amLODipine (NORVASC) 10 MG tablet TAKE 1 TABLET BY MOUTH EVERY DAY 90 tablet 3  . benazepril (LOTENSIN) 10 MG tablet TAKE 1 TABLET (10 MG TOTAL) BY MOUTH DAILY. 30 tablet 1  . CREON 36000 units CPEP capsule Take 2 capsules by mouth 3 (three) times daily before meals. Plus 1 capsule prior to snacks  3  . FREESTYLE LITE test strip USE ONE STRIP TO CHECK GLUCOSE ONCE DAILY. PLEASE  SCHEDULE FOLLOW UP 50 each 0  . insulin aspart (NOVOLOG) 100 UNIT/ML injection Give every morning with breakfast and every evening with supper per the following sliding scale: give 3 units for glucose 150-175, give 6 units for glucose 176-200, give 9 units for glucose 201-250, give 12 units for glucose greater than 250 3 vial 11  . Insulin Detemir (LEVEMIR) 100 UNIT/ML Pen Inject 15 Units into the skin 2 (two) times daily. 90 mL 3  . Insulin Pen Needle 30G X 5 MM MISC Use one daily with insulin 100 each 2  . JARDIANCE 25 MG TABS tablet TAKE 1 TABLET BY MOUTH EVERY DAY 90 tablet 1  . Lancets (FREESTYLE) lancets USE AS INSTRUCTED TO CHECK BLOOD SUGAR ONCE A DAY 100 each 2  . lidocaine-prilocaine (EMLA) cream Apply to affected area once 30 g 3  . loratadine (CLARITIN) 10 MG tablet Take 10 mg by mouth daily.    Marland Kitchen LORazepam (ATIVAN) 0.5 MG tablet Take 1 tablet  (0.5 mg total)  by mouth every 6 (six) hours as needed (Nausea or vomiting). 30 tablet 0  . magnesium oxide (MAG-OX) 400 (241.3 Mg) MG tablet TAKE 1 TABLET (400 MG TOTAL) BY MOUTH DAILY. 90 tablet 1  . Multiple Vitamin (MULTIVITAMIN WITH MINERALS) TABS tablet Take 1 tablet by mouth daily.    . naproxen sodium (ANAPROX) 220 MG tablet Take 440 mg by mouth 2 (two) times daily as needed (for pain.).    Marland Kitchen NOVOLOG FLEXPEN 100 UNIT/ML FlexPen GIVE EVERY MORNING WITH BREAKFAST AND EVERY EVENING WITH SUPPER PER SLIDING SCALE 3 mL 0  . Omega-3 Fatty Acids (FISH OIL) 1200 MG CAPS Take 2,400 mg by mouth daily.    Marland Kitchen omeprazole (PRILOSEC) 20 MG capsule TAKE ONE CAPSULE BY MOUTH ONCE DAILY 90 capsule 3  . ondansetron (ZOFRAN) 8 MG tablet Take 1 tablet (8 mg total) by mouth 2 (two) times daily as needed (Nausea or vomiting). 30 tablet 1  . oxyCODONE (OXY IR/ROXICODONE) 5 MG immediate release tablet Take 1-2 tablets (5-10 mg total) by mouth every 4 (four) hours as needed for moderate pain, severe pain or breakthrough pain. 40 tablet 0  . potassium chloride SA (KLOR-CON M20) 20 MEQ tablet Take 1 tablet (20 mEq total) by mouth daily. 30 tablet 1  . prochlorperazine (COMPAZINE) 10 MG tablet Take 1 tablet (10 mg total) by mouth every 6 (six) hours as needed (Nausea or vomiting). 30 tablet 1  . traMADol (ULTRAM) 50 MG tablet Take 50 mg by mouth every 6 (six) hours as needed.     No current facility-administered medications for this visit.    Facility-Administered Medications Ordered in Other Visits  Medication Dose Route Frequency Provider Last Rate Last Dose  . sodium chloride 0.9 % injection 10 mL  10 mL Intravenous PRN Truitt Merle, MD   10 mL at 02/09/17 1111    REVIEW OF SYSTEMS:  Constitutional: Denies fevers, chills or abnormal night sweats  Eyes: Denies blurriness of vision, double vision or watery eyes Ears, nose, mouth, throat, and face: Denies mucositis or sore throat Respiratory: Denies cough, dyspnea or  wheezes Cardiovascular: Denies palpitation, chest discomfort or lower extremity swelling Gastrointestinal:  Denies nausea, heartburn or change in bowel habits (+) draining/discharge from midline incision Skin: Denies abnormal skin rashes Lymphatics: Denies new lymphadenopathy or easy bruising Neurological:Denies numbness, tingling or new weaknesses Behavioral/Psych: Mood is stable, no new changes  All other systems were reviewed with the patient and are negative.  PHYSICAL EXAMINATION: ECOG PERFORMANCE STATUS: 1  Vitals:   04/20/17 1401  BP: 121/71  Pulse: 70  Resp: 18  Temp: 98.2 F (36.8 C)  SpO2: 100%   Filed Weights   04/20/17 1401  Weight: 194 lb 4.8 oz (88.1 kg)    GENERAL:alert, no distress and comfortable.  SKIN: skin color, texture, turgor are normal, no rashes or significant lesions EYES: normal, conjunctiva are pink and non-injected, sclera clear OROPHARYNX:no exudate, no erythema and lips, buccal mucosa, and tongue normal  NECK: supple, thyroid normal size, non-tender, without nodularity LYMPH:  no palpable lymphadenopathy in the cervical, axillary or inguinal LUNGS: clear to auscultation and percussion with normal breathing effort HEART: regular rate & rhythm and no murmurs and no lower extremity edema ABDOMEN: abdomen soft, non-tender and normal bowel sounds, midline surgical scar is healing, is still cover by gauze, no tenderness Musculoskeletal:no cyanosis of digits and no clubbing  PSYCH: alert & oriented x 3 with fluent speech NEURO: no focal motor/sensory deficits  LABORATORY DATA:  I  have reviewed the data as listed CBC Latest Ref Rng & Units 04/20/2017 03/09/2017 03/02/2017  WBC 4.0 - 10.3 10e3/uL 7.6 3.5(L) 7.6  Hemoglobin 13.0 - 17.1 g/dL 10.5(L) 9.9(L) 10.3(L)  Hematocrit 38.4 - 49.9 % 32.5(L) 30.9(L) 31.5(L)  Platelets 140 - 400 10e3/uL 315 312 361   CMP Latest Ref Rng & Units 04/20/2017 03/09/2017 03/02/2017  Glucose 70 - 140 mg/dl 114 123 115  BUN  7.0 - 26.0 mg/dL 20.7 13.6 13.8  Creatinine 0.7 - 1.3 mg/dL 0.8 0.7 0.7  Sodium 136 - 145 mEq/L 141 139 141  Potassium 3.5 - 5.1 mEq/L 3.8 3.7 3.8  Chloride 101 - 111 mmol/L - - -  CO2 22 - 29 mEq/L 28 30(H) 28  Calcium 8.4 - 10.4 mg/dL 9.6 9.9 9.4  Total Protein 6.4 - 8.3 g/dL 7.8 7.6 7.2  Total Bilirubin 0.20 - 1.20 mg/dL 0.46 0.37 0.39  Alkaline Phos 40 - 150 U/L 116 128 121  AST 5 - 34 U/L 20 23 18   ALT 0 - 55 U/L 30 26 20     CA19.9 (0-35 U/ML)  02/28/2016: 1497 03/24/2016: 648 04/25/2016: 599 05/24/2016: 368 07/18/2016: 51 08/28/16: 55 10/06/16: 133 12/08/2016: 36 01/19/17: 36 02/07/17: 21 03/02/17: 26 04/20/17: PENDING   PATHOLOGY REPORT   Surgical Pathology 11/17/16 Dr. Mariah Milling  A. Head of pancreas, stomach, and duodenum, pancreatoduodenectomy (Whipple procedure) post oncologic treatment:  - Invasive moderately differentiated ductal adenocarcinoma (2.7 cm). - Perineural invasion is seen. - Twenty lymph nodes, negative for tumor (0/20). - Very minimal treatment response, if any. - Margins of resection are negative for tumor (2.5 mm to uncinate margin, 3 mm to bile duct and pancreatic neck margins).   B. Gallbladder and contents, cholecystectomy:  Chronic cholecystitis with acute mucosal inflammation and reactive atypia.   Synoptic Report PANCREAS (EXOCRINE)(Pancreas Exo - A)     Liver, biopsy 09/19/16 - BENIGN HEPATIC PARENCHYMA WITH ASSOCIATED FIBROSIS. - THERE IS NO EVIDENCE OF MALIGNANCY.  Diagnosis 03/09/2016 FINE NEEDLE ASPIRATION, ENDOSCOPIC, PANCREAS (SPECIMEN 1 OF 1 COLLECTED 03/09/16): MALIGNANT CELLS CONSISTENT WITH ADENOCARCINOMA. BACKGROUND NECROTIC DEBRIS AND ACUTE INFLAMMATION.  Diagnosis 03/01/2016 BILE DUCT BRUSHING(SPECIMEN 1 OF 1 COLLECTED 03/01/16): RARE ATYPICAL CELLS, SEE COMMENT.  RADIOGRAPHIC STUDIES: I have personally reviewed the radiological images as listed and agreed with the findings in the report. No results found.   CT CAP W WO CONTRAST  09/04/16 IMPRESSION: 1. Poorly defined pancreatic head mass is grossly stable, with associated pancreatic ductal dilatation and portacaval adenopathy. No associated vascular encasement. 2. Common bile duct stent in place with stable biliary ductal dilatation. Left hepatic lobe atrophy. 3. Hepatomegaly.  Spleen is at the upper limits normal in size. 4. Aortic atherosclerosis (ICD10-170.0). Coronary artery calcification.  DG CEST PORT 1 09/19/16 IMPRESSION: 1. Lines and tubes noted as above. 2. Mild bibasilar subsegmental atelectasis. 3. Cardiomegaly, no pulmonary venous congestion. 4. Free intraperitoneal air consistent with prior surgery.  EUS 03/09/2016 Dr. Ardis Hughs  - 3.5cm irregularly shaped, poorly defined mass in the pancreatic head. There is suspicious, nearby adenopathy. The mass is causing pancreatic and bililary duct obstruction (previously stented). The mass involves the portal vein for 88mm (abuttment and loss of usual tissue interface), strongly suggesting invasion. Preliminary cytology results are positive for malignancy (adenocarcinoma), await final report.  ASSESSMENT & PLAN: 61 y.o. Caucasian male, with past medical history of diabetes and hypertension, presented with epigastric pain, weight loss, and jaundice.  1. Primary pancreatic adenocarcinoma, in pancreatic head, cT2N1M0, stage IIB, ypT2N0M0 -I have  previously reviewed his CT abdomen and pelvis with contrast, EUS and biopsy findings with patient and his wife in great details. -His case was previously reviewed in our GI tumor board a few days ago. CT scan and US showed possible portal vein invasion from the pancreatic tumor, this is borderline resectable disease. No evidence of distant metastasis.  -We previously reviewed the nature history of pancreatic cancer, and the overall survival rate with chemotherapy, surgery, and radiation. Patient and his wife was discouraged by the overall dismal long term survival rate (~20%)   -He received neoadjuvant chemotherapy with FOLFIRINOX and neoadjuvant chemoradiation, tolerated well  -He previously underwent diagnostic laparoscopy and possible whipple procedure on 09/19/2016 by Dr. Barry Dienes to attempt removal of the pancreatic tumor. The tumor appeared to be encasing the SMV/portal vein confluence with blurring of the interface. The tumor also appeared to be invading the superior mesenteric artery on the right lateral surface. This point, the procedure was aborted. -Dr. Mariah Milling at [facility] performed Whipple surgery on 11/17/16, he had complete resection  -I reviewed his surgical pathology findings, which showed a T2 tumor, surgical margins were negative, all lymph nodes were negative. - He received additional 9 weeks of adjuvant gemcitabine. -His tumor marker CA 19.9 has came down to normal, which is a good sign. No clinical concern for recurrence. -He is recovered well from chemotherapy, and coping well. Labs reviewed with Pt, physical exam was unremarkable. -He is scheduled to follow-up at at [facility] with the surveillance CT scan in a month. -F/u in 7-8 weeks   2. Type 2 DM - he will continue medication and follow up with his primary care physician Dr. Sarajane Jews  -We previously reviewed that his blood glucose will need to be monitored closely during the chemotherapy, and his medication may need to be adjusted  -His blood glucose has been fluctuating lately, he sees a diabetic specialist and now, and checking his finger glucose 3 times a day, better controlled lately. -  I previously advised the patient to continue watching his blood glucose levels. - I previously recommended Glucerna as a nutritional supplement due to lower amount of carbohydrate  3. HTN  -He is on amlodipine and benazepril  -His blood pressure is normal today. -The previously discussed chemotherapy may affect his blood pressure, and he is medication may need to be adjusted  4. Anemia secondary to surgery and chemotherapy   -Mild, Continue monitoring, -Improved since he finished chemotherapy.   5. Open abdominal wound  -Follow up at at [facility] surgical clinic in October. -Wound closed but still healing.   Plan - lab and follow up in 7-8 weeks -f/u with a surveillance CT scan at [facility] in a month  All questions were answered. The patient knows to call the clinic with any problems, questions or concerns.  I spent 20 minutes counseling the patient face to face. The total time spent in the appointment was 25 minutes and more than 50% was on counseling.   This document serves as a record of services personally performed by Truitt Merle, MD. It was created on her behalf by Joslyn Devon, a trained medical scribe. The creation of this record is based on the scribe's personal observations and the provider's statements to them. This document has been checked and approved by the attending provider.   Truitt Merle  04/20/2017

## 2017-04-20 NOTE — Telephone Encounter (Signed)
Gave avs and calendar for November  °

## 2017-04-21 ENCOUNTER — Encounter: Payer: Self-pay | Admitting: Hematology

## 2017-04-21 LAB — CANCER ANTIGEN 19-9: CA 19-9: 30 U/mL (ref 0–35)

## 2017-04-24 ENCOUNTER — Other Ambulatory Visit: Payer: Self-pay | Admitting: *Deleted

## 2017-04-24 MED ORDER — BENAZEPRIL HCL 10 MG PO TABS: 10.0000 mg | ORAL_TABLET | Freq: Every day | ORAL | 1 refills | Status: DC

## 2017-04-24 NOTE — Telephone Encounter (Signed)
Colton Bush called from [facility] stating she has tried faxing a note for a 90-day supply of Benazepril vs a 30-day and cannot get through.  I sent the Rx via escribe.

## 2017-04-26 ENCOUNTER — Other Ambulatory Visit: Payer: Self-pay | Admitting: Hematology

## 2017-04-26 DIAGNOSIS — C250 Malignant neoplasm of head of pancreas: Secondary | ICD-10-CM

## 2017-04-27 ENCOUNTER — Other Ambulatory Visit: Payer: Self-pay | Admitting: *Deleted

## 2017-04-27 DIAGNOSIS — C250 Malignant neoplasm of head of pancreas: Secondary | ICD-10-CM

## 2017-04-27 MED ORDER — POTASSIUM CHLORIDE CRYS ER 20 MEQ PO TBCR: 20.0000 meq | EXTENDED_RELEASE_TABLET | Freq: Every day | ORAL | 1 refills | Status: DC

## 2017-05-29 ENCOUNTER — Other Ambulatory Visit: Payer: Self-pay | Admitting: Hematology

## 2017-05-29 ENCOUNTER — Inpatient Hospital Stay
Admission: RE | Admit: 2017-05-29 | Discharge: 2017-05-29 | Disposition: A | Discharge status: Home / Self Care | Payer: Self-pay | Source: Ambulatory Visit | Attending: Hematology | Admitting: Hematology

## 2017-05-29 DIAGNOSIS — C801 Malignant (primary) neoplasm, unspecified: Secondary | ICD-10-CM

## 2017-06-07 NOTE — Progress Notes (Signed)
[place]  [phone] Fax:([phone]  Clinic Follow up Note   Patient Care Team: [name], MD as PCP - General 06/08/2017  CHIEF COMPLAINTS:  Follow up pancreatic cancer  Oncology History   Cancer Staging Adenocarcinoma of head of pancreas [facility]) Staging form: Pancreas, AJCC 7th Edition - Clinical stage from 03/09/2016: Stage IIB (T2, N1, M0) - Signed by Truitt Merle, MD on 03/17/2016 - Pathologic stage from 11/17/2016: Stage IB (yT2, N0, cM0) - Signed by Truitt Merle, MD on 12/10/2016       Adenocarcinoma of head of pancreas ([place])   02/27/2016 Imaging    CT chest, abdomen and pelvis with contrast showed ill-defined heterogeneity of pancreatic head, highly suspicious for malignancy, ) quit about and biliary ductal dilatation, mildly prominent lymph nodes in the upper abdomen, largest 2 cm in the portocaval . No other metastasis.       03/09/2016 Initial Diagnosis    Adenocarcinoma of head of pancreas ([place])      03/09/2016 Procedure    Upper EUS showed a 3.5 cm irregular mass in the pancreatic head, causing pancreatic and biliary duct obstruction. The mass involves the portal vein 422 mm, strongly suggesting invasion. There is suspicious nearby adenopathy      03/09/2016 Initial Biopsy    Fine-needle aspiration of the pancreatic mass from EUS showed malignant cells consistent with adenocarcinoma.       03/28/2016 - 05/24/2016 Chemotherapy    neoadjuvant chemo with FOLFIRINOX every 2 weeks, for 5 cycles       06/28/2016 - 08/10/2016 Radiation Therapy    Neoadjuvant radiation by Dr. [name] Site/dose:   Pancreas treated to 45 Gy in 25 fractions. The Pancreas was then boosted to 54 Gy in 5 fractions.      06/28/2016 - 08/10/2016 Chemotherapy    Xeloda 2000 mg in the morning and 1500 mg in the evening, on the day of radiation.      09/04/2016 Imaging    CT Chest Abdomen Pelvis IMPRESSION: 1. Poorly defined pancreatic head mass is grossly stable,  with associated pancreatic ductal dilatation and portacaval adenopathy. No associated vascular encasement. 2. Common bile duct stent in place with stable biliary ductal dilatation. Left hepatic lobe atrophy. 3. Hepatomegaly.  Spleen is at the upper limits normal in size. 4. Aortic atherosclerosis (ICD10-170.0). Coronary artery calcification.      09/19/2016 Surgery    Diagnostic laparoscopy and possible whipple procedure by Dr. Barry Dienes. Whipple procedure aborted due to the pancreatic cancer involving SMV, SMA and portal veins.      09/19/2016 Pathology Results    Two liver lesions were noted during diagnostic laparoscopy and biopsy revealed benign hepatic parenchyma with assoicated fibrosis with no evidence of malignancy.      10/06/2016 - 10/20/2016 Chemotherapy    Gemcitabine and Abraxane weekly, on day 1, 8, and 15 every 28 days, starting on 10/06/2016, held after1 cycle due to pending surgery at [facility]      10/31/2016 Imaging    CT CAP w contrast at [facility] 10/31/16 Impression: 1.Interval decrease in the size of the ill-defined pancreatic head mass. There is short segment abutment of the portal vein/superior mesenteric vein but no evidence of venous distortion. 2.No evidence of metastatic disease in the chest, abdomen and pelvis.      11/17/2016 -  Hospital Admission    Patient presents to hospital for nausea and vomitting      11/17/2016 Surgery    pancreatecomy, promximal subtotal with total  duodenectomy, partial  GASTRECTOMY, CHOLEDOCHOENTEROSTOMY AND GASTROJEJUNOSTOMY (WHIPPLE-TYPE PROCEDURE); WITH PANCREATOJEJUNOSTOMY      01/19/2017 - 03/02/2017 Chemotherapy    Gemcitabine monotherapy: 1000mg /m2, d1,8 Q21d x2 cycles planned       05/18/2017 Imaging    CT CAP at [facility] 05/18/17 Findings: Chest: The thyroid gland is normal. No mediastinal, hilar, or axillary lymphadenopathy. The heart is normal in size. The great vessels are unremarkable. No pneumothorax or pleural effusion.  There are no suspicious pulmonary nodules. Abdomen and pelvis: The liver is normal in morphology without focal lesions. The gallbladder is surgically absent. The spleen is normal in size. The patient is status post Whipple with normal appearance of the pancreatic remnant. A soft tissue density in the surgical bed measuring 0.9 x 0.6 cm (series 5, image 50). A pancreatic stent is in place. The small bowel is normal in caliber. The large bowel is unremarkable. No lymphadenopathy identified. The adrenal glands are normal. The bilateral kidneys enhance symmetrically. Subcentimeter hypoattenuating renal lesions are too small to further characterize. The urinary bladder is well-distended and appears normal. The abdominal aorta contains scattered vascular plaque without aneurysmal dilation. The IVC is unremarkable. The portal vein is patent. There is unchanged wedging of L2. Unchanged sclerotic lesion in the left iliac wing, which may represent a bone island. Changes from midline laparotomy are noted. Impression: 1.Status post Whipple with soft tissue density just posterior to the SMA, which may be postsurgical, lymph node, or less likely recurrent disease. Recommend attention on follow-up 2.No evidence of metastatic disease in the chest, abdomen, and pelvis.         HISTORY OF PRESENTING ILLNESS:  Colton Bush 61 y.o. male is here because of His newly diagnosed pancreatic cancer. He is accompanied by his wife to our multidisciplinary GI clinic today.  He noticed fatigue, anorexia and weight loss about 50bs in the past 4 months. It started when his endocrinologist changed his diabetic meds. He presented to hospital on 7/30 with jaundice. No pain or nausea. CT scan revealed a heterogeneous pancreatic head mass, highly suspicious for malignancy. Mild biliary and pancreatic duct dilatation, mildly prominent lymph nodes in the upper abdomen. He underwent ERCP and CBD stent placement, and  subsequent upper EUS on 03/09/2016, which showed a 3.5 cm irregular mass in the pancreatic head, the mass involves the portal vein, no suspicious nearby adenopathy.  He has been feeling better after stent placement, with improved appetite and energy level. He feels normal again. No symptoms. BM is good, he has gained about 8lbs back.   CURRENT THERAPY: Surveillance   INTERIM HISTORY:   Mr. Gullion returns for follow up. He presents to the clinic today noting his wound is mostly closed and has no pain. He feels his energy is mostly back to his normal. He put on some weight and now wants to keep it stable. He denies issues with BM and denies Nausea.  He has returned to work currently and is on disability. He feels they may terminate him in the beginning of next year.      MEDICAL HISTORY:  Past Medical History:  Diagnosis Date  . Arthritis    left hand  . Bronchitis 1977  . Cancer ([place]) 03/09/2016   pancreatic cancer  . Depression    takes Cymbalta daily  . Diabetes mellitus type II    sees [name]   . GERD (gastroesophageal reflux disease)    takes Omeprazole daily  . H/O hiatal hernia   . Hyperlipidemia  takes Zocor daily  . Hypertension    takes Amlodipine daily  . Neck pain    C4-7 stenosis and herniated disc  . Neuromuscular disorder ([place])    hiatal hernia  . Scoliosis    slight  . Spinal cord injury, C5-C7 ([place])    c4-c7  . Stiffness of hand joint    d/t cervical issues    SURGICAL HISTORY: Past Surgical History:  Procedure Laterality Date  . CARPAL TUNNEL RELEASE  2013   bilateral, per Dr. Christella Noa   . COLONOSCOPY  10-30-14   per Dr. Olevia Perches, clear, repeat in 10 yrs   . egd with esophageal dilation  9-08   per Dr. Olevia Perches  . lymph nodes biopsy    . melanoma rt calf  1999  . SPINE SURGERY    . TONSILLECTOMY     as a child  . ULNAR TUNNEL RELEASE  2013   right arm, per Dr. Christella Noa   . UPPER GASTROINTESTINAL ENDOSCOPY      SOCIAL HISTORY: Social History     Socioeconomic History  . Marital status: Married    Spouse name: [name]  . Number of children: Not on file  . Years of education: Not on file  . Highest education level: Not on file  Social Needs  . Financial resource strain: Not on file  . Food insecurity - worry: Not on file  . Food insecurity - inability: Not on file  . Transportation needs - medical: Not on file  . Transportation needs - non-medical: Not on file  Occupational History  . Occupation: Geophysicist/field seismologist  Tobacco Use  . Smoking status: Never Smoker  . Smokeless tobacco: Never Used  . Tobacco comment: tried a pipe 35 years ago   Substance and Sexual Activity  . Alcohol use: No    Alcohol/week: 0.0 oz  . Drug use: No  . Sexual activity: Yes  Other Topics Concern  . Not on file  Social History Narrative   Married, wife [name]   [employer] Nutritional therapist-   He used to work as a Geophysicist/field seismologist for a company  He has one son 40 yo. Lives with his wife   FAMILY HISTORY: Family History  Problem Relation Age of Onset  . Heart disease Father   . Heart disease Brother 51  . Anesthesia problems Mother   . Heart disease Mother   . Dementia Mother   . Diabetes Sister   . Stroke Sister   . Colon cancer Neg Hx   . Rectal cancer Neg Hx   . Stomach cancer Neg Hx     ALLERGIES:  is allergic to no known allergies.  MEDICATIONS:  Current Outpatient Medications  Medication Sig Dispense Refill  . amLODipine (NORVASC) 10 MG tablet TAKE 1 TABLET BY MOUTH EVERY DAY 90 tablet 3  . benazepril (LOTENSIN) 10 MG tablet Take 1 tablet (10 mg total) by mouth daily. 90 tablet 1  . CREON 36000 units CPEP capsule Take 2 capsules by mouth 3 (three) times daily before meals. Plus 1 capsule prior to snacks  3  . FREESTYLE LITE test strip USE ONE STRIP TO CHECK GLUCOSE ONCE DAILY. PLEASE  SCHEDULE FOLLOW UP 50 each 0  . insulin aspart (NOVOLOG) 100 UNIT/ML injection Give every morning with breakfast and every evening with supper per the following sliding  scale: give 3 units for glucose 150-175, give 6 units for glucose 176-200, give 9 units for glucose 201-250, give 12 units for glucose greater than 250 3  vial 11  . Insulin Detemir (LEVEMIR) 100 UNIT/ML Pen Inject 15 Units into the skin 2 (two) times daily. 90 mL 3  . Insulin Pen Needle 30G X 5 MM MISC Use one daily with insulin 100 each 2  . JARDIANCE 25 MG TABS tablet TAKE 1 TABLET BY MOUTH EVERY DAY 90 tablet 1  . Lancets (FREESTYLE) lancets USE AS INSTRUCTED TO CHECK BLOOD SUGAR ONCE A DAY 100 each 2  . lidocaine-prilocaine (EMLA) cream Apply to affected area once 30 g 3  . loratadine (CLARITIN) 10 MG tablet Take 10 mg by mouth daily.    Marland Kitchen LORazepam (ATIVAN) 0.5 MG tablet Take 1 tablet (0.5 mg total) by mouth every 6 (six) hours as needed (Nausea or vomiting). 30 tablet 0  . magnesium oxide (MAG-OX) 400 (241.3 Mg) MG tablet TAKE 1 TABLET (400 MG TOTAL) BY MOUTH DAILY. 90 tablet 1  . Multiple Vitamin (MULTIVITAMIN WITH MINERALS) TABS tablet Take 1 tablet by mouth daily.    . naproxen sodium (ANAPROX) 220 MG tablet Take 440 mg by mouth 2 (two) times daily as needed (for pain.).    Marland Kitchen NOVOLOG FLEXPEN 100 UNIT/ML FlexPen GIVE EVERY MORNING WITH BREAKFAST AND EVERY EVENING WITH SUPPER PER SLIDING SCALE 3 mL 0  . Omega-3 Fatty Acids (FISH OIL) 1200 MG CAPS Take 2,400 mg by mouth daily.    Marland Kitchen omeprazole (PRILOSEC) 20 MG capsule TAKE ONE CAPSULE BY MOUTH ONCE DAILY 90 capsule 3  . ondansetron (ZOFRAN) 8 MG tablet Take 1 tablet (8 mg total) by mouth 2 (two) times daily as needed (Nausea or vomiting). 30 tablet 1  . oxyCODONE (OXY IR/ROXICODONE) 5 MG immediate release tablet Take 1-2 tablets (5-10 mg total) by mouth every 4 (four) hours as needed for moderate pain, severe pain or breakthrough pain. 40 tablet 0  . potassium chloride SA (KLOR-CON M20) 20 MEQ tablet Take 1 tablet (20 mEq total) by mouth daily. 30 tablet 1  . prochlorperazine (COMPAZINE) 10 MG tablet Take 1 tablet (10 mg total) by mouth every 6  (six) hours as needed (Nausea or vomiting). 30 tablet 1  . traMADol (ULTRAM) 50 MG tablet Take 50 mg by mouth every 6 (six) hours as needed.     No current facility-administered medications for this visit.    Facility-Administered Medications Ordered in Other Visits  Medication Dose Route Frequency Provider Last Rate Last Dose  . sodium chloride 0.9 % injection 10 mL  10 mL Intravenous PRN Truitt Merle, MD   10 mL at 02/09/17 1111    REVIEW OF SYSTEMS:  Constitutional: Denies fevers, chills or abnormal night sweats  Eyes: Denies blurriness of vision, double vision or watery eyes Ears, nose, mouth, throat, and face: Denies mucositis or sore throat Respiratory: Denies cough, dyspnea or wheezes Cardiovascular: Denies palpitation, chest discomfort or lower extremity swelling Gastrointestinal:  Denies nausea, heartburn or change in bowel habits Skin: Denies abnormal skin rashes Lymphatics: Denies new lymphadenopathy or easy bruising Neurological:Denies numbness, tingling or new weaknesses Behavioral/Psych: Mood is stable, no new changes  All other systems were reviewed with the patient and are negative.  PHYSICAL EXAMINATION: ECOG PERFORMANCE STATUS: 1  Vitals:   06/08/17 1027  BP: 124/66  Pulse: 87  Resp: 18  Temp: 98.4 F (36.9 C)  SpO2: 97%   Filed Weights   06/08/17 1027  Weight: 203 lb 8 oz (92.3 kg)    GENERAL:alert, no distress and comfortable.  SKIN: skin color, texture, turgor are normal, no rashes or  significant lesions EYES: normal, conjunctiva are pink and non-injected, sclera clear OROPHARYNX:no exudate, no erythema and lips, buccal mucosa, and tongue normal  NECK: supple, thyroid normal size, non-tender, without nodularity LYMPH:  no palpable lymphadenopathy in the cervical, axillary or inguinal LUNGS: clear to auscultation and percussion with normal breathing effort HEART: regular rate & rhythm and no murmurs and no lower extremity edema ABDOMEN: abdomen soft,  non-tender and normal bowel sounds, the midline surgical scar has healed with a large scar and some area are still pinkish, no discharge   Musculoskeletal:no cyanosis of digits and no clubbing  PSYCH: alert & oriented x 3 with fluent speech NEURO: no focal motor/sensory deficits  LABORATORY DATA:  I have reviewed the data as listed CBC Latest Ref Rng & Units 06/08/2017 04/20/2017 03/09/2017  WBC 4.0 - 10.3 10e3/uL 7.1 7.6 3.5(L)  Hemoglobin 13.0 - 17.1 g/dL 10.5(L) 10.5(L) 9.9(L)  Hematocrit 38.4 - 49.9 % 33.1(L) 32.5(L) 30.9(L)  Platelets 140 - 400 10e3/uL 341 315 312   CMP Latest Ref Rng & Units 06/08/2017 04/20/2017 03/09/2017  Glucose 70 - 140 mg/dl 58(L) 114 123  BUN 7.0 - 26.0 mg/dL 18.0 20.7 13.6  Creatinine 0.7 - 1.3 mg/dL 0.7 0.8 0.7  Sodium 136 - 145 mEq/L 143 141 139  Potassium 3.5 - 5.1 mEq/L 4.6 3.8 3.7  Chloride 101 - 111 mmol/L - - -  CO2 22 - 29 mEq/L 28 28 30(H)  Calcium 8.4 - 10.4 mg/dL 10.0 9.6 9.9  Total Protein 6.4 - 8.3 g/dL 8.4(H) 7.8 7.6  Total Bilirubin 0.20 - 1.20 mg/dL 0.36 0.46 0.37  Alkaline Phos 40 - 150 U/L 115 116 128  AST 5 - 34 U/L 20 20 23   ALT 0 - 55 U/L 30 30 26     CA19.9 (0-35 U/ML)  02/28/2016: 1497 03/24/2016: 648 04/25/2016: 599 05/24/2016: 368 07/18/2016: 51 08/28/16: 55 10/06/16: 133 12/08/2016: 36 01/19/17: 36 02/07/17: 21 03/02/17: 26 04/20/17: 30 06/08/17: PENDING    PATHOLOGY REPORT   Surgical Pathology 11/17/16 Dr. Mariah Milling  A. Head of pancreas, stomach, and duodenum, pancreatoduodenectomy (Whipple procedure) post oncologic treatment:  - Invasive moderately differentiated ductal adenocarcinoma (2.7 cm). - Perineural invasion is seen. - Twenty lymph nodes, negative for tumor (0/20). - Very minimal treatment response, if any. - Margins of resection are negative for tumor (2.5 mm to uncinate margin, 3 mm to bile duct and pancreatic neck margins).   B. Gallbladder and contents, cholecystectomy:  Chronic cholecystitis with acute mucosal  inflammation and reactive atypia.   Synoptic Report PANCREAS (EXOCRINE)(Pancreas Exo - A)     Liver, biopsy 09/19/16 - BENIGN HEPATIC PARENCHYMA WITH ASSOCIATED FIBROSIS. - THERE IS NO EVIDENCE OF MALIGNANCY.  Diagnosis 03/09/2016 FINE NEEDLE ASPIRATION, ENDOSCOPIC, PANCREAS (SPECIMEN 1 OF 1 COLLECTED 03/09/16): MALIGNANT CELLS CONSISTENT WITH ADENOCARCINOMA. BACKGROUND NECROTIC DEBRIS AND ACUTE INFLAMMATION.  Diagnosis 03/01/2016 BILE DUCT BRUSHING(SPECIMEN 1 OF 1 COLLECTED 03/01/16): RARE ATYPICAL CELLS, SEE COMMENT.  RADIOGRAPHIC STUDIES: I have personally reviewed the radiological images as listed and agreed with the findings in the report. Ct Outside Films Upper Extremity  Result Date: 05/30/2017 This examination belongs to an outside facility and is stored here for comparison purposes only.  Contact the originating outside institution for any associated report or interpretation.  Ct Outside Films Body  Result Date: 05/29/2017 This examination belongs to an outside facility and is stored here for comparison purposes only.  Contact the originating outside institution for any associated report or interpretation.    CT CAP at  [facility] 05/18/17 Findings: Chest: The thyroid gland is normal. No mediastinal, hilar, or axillary lymphadenopathy. The heart is normal in size. The great vessels are unremarkable. No pneumothorax or pleural effusion. There are no suspicious pulmonary nodules. Abdomen and pelvis: The liver is normal in morphology without focal lesions. The gallbladder is surgically absent. The spleen is normal in size. The patient is status post Whipple with normal appearance of the pancreatic remnant. A soft tissue density in the surgical bed measuring 0.9 x 0.6 cm (series 5, image 50). A pancreatic stent is in place. The small bowel is normal in caliber. The large bowel is unremarkable. No lymphadenopathy identified. The adrenal glands are normal. The bilateral kidneys  enhance symmetrically. Subcentimeter hypoattenuating renal lesions are too small to further characterize. The urinary bladder is well-distended and appears normal. The abdominal aorta contains scattered vascular plaque without aneurysmal dilation. The IVC is unremarkable. The portal vein is patent. There is unchanged wedging of L2. Unchanged sclerotic lesion in the left iliac wing, which may represent a bone island. Changes from midline laparotomy are noted. Impression: 1.Status post Whipple with soft tissue density just posterior to the SMA, which may be postsurgical, lymph node, or less likely recurrent disease. Recommend attention on follow-up 2.No evidence of metastatic disease in the chest, abdomen, and pelvis.     CT CAP W WO CONTRAST 09/04/16 IMPRESSION: 1. Poorly defined pancreatic head mass is grossly stable, with associated pancreatic ductal dilatation and portacaval adenopathy. No associated vascular encasement. 2. Common bile duct stent in place with stable biliary ductal dilatation. Left hepatic lobe atrophy. 3. Hepatomegaly.  Spleen is at the upper limits normal in size. 4. Aortic atherosclerosis (ICD10-170.0). Coronary artery calcification.  DG CEST PORT 1 09/19/16 IMPRESSION: 1. Lines and tubes noted as above. 2. Mild bibasilar subsegmental atelectasis. 3. Cardiomegaly, no pulmonary venous congestion. 4. Free intraperitoneal air consistent with prior surgery.  EUS 03/09/2016 Dr. Ardis Hughs  - 3.5cm irregularly shaped, poorly defined mass in the pancreatic head. There is suspicious, nearby adenopathy. The mass is causing pancreatic and bililary duct obstruction (previously stented). The mass involves the portal vein for 62mm (abuttment and loss of usual tissue interface), strongly suggesting invasion. Preliminary cytology results are positive for malignancy (adenocarcinoma), await final report.  ASSESSMENT & PLAN: 61 y.o. Caucasian male, with past medical history  of diabetes and hypertension, presented with epigastric pain, weight loss, and jaundice.  1. Primary pancreatic adenocarcinoma, in pancreatic head, cT2N1M0, stage IIB, ypT2N0M0 -I have previously reviewed his CT abdomen and pelvis with contrast, EUS and biopsy findings with patient and his wife in great details. -His case was previously reviewed in our GI tumor board a few days ago. CT scan and US showed possible portal vein invasion from the pancreatic tumor, this is borderline resectable disease. No evidence of distant metastasis.  -We previously reviewed the nature history of pancreatic cancer, and the overall survival rate with chemotherapy, surgery, and radiation. Patient and his wife was discouraged by the overall dismal long term survival rate (~20%)  -He received neoadjuvant chemotherapy with FOLFIRINOX and neoadjuvant chemoradiation, tolerated well  -He previously underwent diagnostic laparoscopy and possible whipple procedure on 09/19/2016 by Dr. Barry Dienes to attempt removal of the pancreatic tumor. The tumor appeared to be encasing the SMV/portal vein confluence with blurring of the interface. The tumor also appeared to be invading the superior mesenteric artery on the right lateral surface. This point, the procedure was aborted. -Dr. Mariah Milling at [facility] performed Whipple surgery on 11/17/16, he had  complete resection  -I reviewed his surgical pathology findings, which showed a T2 tumor, surgical margins were negative, all lymph nodes were negative. - He received additional 9 weeks of adjuvant gemcitabine. -His tumor marker CA 19.9 has came down to normal, which is a good sign. No clinical concern for recurrence. -We discussed his 05/18/17 CT CAP from [facility] that shows no recurrence except some soft tissue at the surgical site, likely surgical scar, although recurrence is not completely ruled out. Will continue monitoring. He will continue to do scans at [facility], next in 08/2017.  -Labs reviewed and his sugar  is lower, I suggest he eats after this visit. His blood counts, kidney/liver function is back to normal except his Hg is 10.5. I suggest her take OTC low dose iron. Will check iron level at next visit. Today's CEA is pending, will notify him if results are out of normal range.  -Discussed signs of cancer recurrence. He knows what to watch. -I offered flu shot today and he declined. I encouraged him to stay away from those that are sick.  -He will follow-up with Dr. Mariah Milling at [facility] in 3 months, f/u with me in 5 months  2. Type 2 DM - he will continue medication and follow up with his primary care physician Dr. Sarajane Jews  -We previously reviewed that his blood glucose will need to be monitored closely during the chemotherapy, and his medication may need to be adjusted  -His blood glucose has been fluctuating lately, he sees a diabetic specialist and now, and checking his finger glucose 3 times a day, better controlled lately. -  I previously advised the patient to continue watching his blood glucose levels. - I previously recommended Glucerna as a nutritional supplement due to lower amount of carbohydrate  3. HTN  -He is on amlodipine and benazepril  -His blood pressure is normal today. -The previously discussed chemotherapy may affect his blood pressure, and he is medication may need to be adjusted -His BP has improved since stopping chemo.   4. Anemia secondary to surgery and chemotherapy  -Mild, Continue monitoring, -Improved since he finished chemotherapy.  -Hg is 10.5 today (06/08/17) I suggest he start OTC oral iron daily. Will check iron at next visit.   5. abdominal wound  -Wound closed but still healing.   Plan -port flush today -Take OTC oral iron daily and I will check iron next visit  -F/u with [facility] with Surveillance scan in 2/019 -Lab, flush and f/u in 5 months -Port flush today, and 6 weeks    All questions were answered. The patient knows to call the clinic with any problems,  questions or concerns.  I spent 20 minutes counseling the patient face to face. The total time spent in the appointment was 25 minutes and more than 50% was on counseling.   This document serves as a record of services personally performed by Truitt Merle, MD. It was created on her behalf by Joslyn Devon, a trained medical scribe. The creation of this record is based on the scribe's personal observations and the provider's statements to them.    I have reviewed the above documentation for accuracy and completeness, and I agree with the above.    Truitt Merle  06/08/2017

## 2017-06-08 ENCOUNTER — Other Ambulatory Visit (HOSPITAL_BASED_OUTPATIENT_CLINIC_OR_DEPARTMENT_OTHER): Payer: 59

## 2017-06-08 ENCOUNTER — Ambulatory Visit (HOSPITAL_BASED_OUTPATIENT_CLINIC_OR_DEPARTMENT_OTHER): Payer: 59 | Admitting: Hematology

## 2017-06-08 ENCOUNTER — Encounter: Payer: Self-pay | Admitting: Hematology

## 2017-06-08 VITALS — BP 124/66 | HR 87 | Temp 98.4°F | Resp 18 | Ht 70.0 in | Wt 203.5 lb

## 2017-06-08 DIAGNOSIS — C250 Malignant neoplasm of head of pancreas: Secondary | ICD-10-CM

## 2017-06-08 DIAGNOSIS — E119 Type 2 diabetes mellitus without complications: Secondary | ICD-10-CM

## 2017-06-08 DIAGNOSIS — D6481 Anemia due to antineoplastic chemotherapy: Secondary | ICD-10-CM

## 2017-06-08 LAB — CBC WITH DIFFERENTIAL/PLATELET
BASO%: 0.8 % (ref 0.0–2.0)
Basophils Absolute: 0.1 10*3/uL (ref 0.0–0.1)
EOS%: 3.6 % (ref 0.0–7.0)
Eosinophils Absolute: 0.3 10*3/uL (ref 0.0–0.5)
HCT: 33.1 % — ABNORMAL LOW (ref 38.4–49.9)
HGB: 10.5 g/dL — ABNORMAL LOW (ref 13.0–17.1)
LYMPH%: 9.5 % — ABNORMAL LOW (ref 14.0–49.0)
MCH: 24.9 pg — ABNORMAL LOW (ref 27.2–33.4)
MCHC: 31.7 g/dL — ABNORMAL LOW (ref 32.0–36.0)
MCV: 78.6 fL — ABNORMAL LOW (ref 79.3–98.0)
MONO#: 0.7 10*3/uL (ref 0.1–0.9)
MONO%: 10 % (ref 0.0–14.0)
NEUT#: 5.4 10*3/uL (ref 1.5–6.5)
NEUT%: 76.1 % — ABNORMAL HIGH (ref 39.0–75.0)
Platelets: 341 10*3/uL (ref 140–400)
RBC: 4.21 10*6/uL (ref 4.20–5.82)
RDW: 15.4 % — ABNORMAL HIGH (ref 11.0–14.6)
WBC: 7.1 10*3/uL (ref 4.0–10.3)
lymph#: 0.7 10*3/uL — ABNORMAL LOW (ref 0.9–3.3)

## 2017-06-08 LAB — COMPREHENSIVE METABOLIC PANEL
ALT: 30 U/L (ref 0–55)
AST: 20 U/L (ref 5–34)
Albumin: 3.8 g/dL (ref 3.5–5.0)
Alkaline Phosphatase: 115 U/L (ref 40–150)
Anion Gap: 9 mEq/L (ref 3–11)
BUN: 18 mg/dL (ref 7.0–26.0)
CO2: 28 mEq/L (ref 22–29)
Calcium: 10 mg/dL (ref 8.4–10.4)
Chloride: 106 mEq/L (ref 98–109)
Creatinine: 0.7 mg/dL (ref 0.7–1.3)
EGFR: >60 mL/min/{1.73_m2} (ref >60)
Glucose: 58 mg/dl — ABNORMAL LOW (ref 70–140)
Potassium: 4.6 mEq/L (ref 3.5–5.1)
Sodium: 143 mEq/L (ref 136–145)
Total Bilirubin: 0.36 mg/dL (ref 0.20–1.20)
Total Protein: 8.4 g/dL — ABNORMAL HIGH (ref 6.4–8.3)

## 2017-06-08 MED ORDER — HEPARIN SOD (PORK) LOCK FLUSH 100 UNIT/ML IV SOLN
500.0000 [IU] | Freq: Once | INTRAVENOUS | Status: AC
  Administered 2017-06-08: 500 [IU]
  Filled 2017-06-08: qty 5

## 2017-06-08 MED ORDER — SODIUM CHLORIDE 0.9 % IJ SOLN
10.0000 mL | Freq: Once | INTRAMUSCULAR | Status: AC
  Administered 2017-06-08: 10 mL
  Filled 2017-06-08: qty 10

## 2017-06-09 LAB — CANCER ANTIGEN 19-9: CA 19-9: 25 U/mL (ref 0–35)

## 2017-06-18 ENCOUNTER — Other Ambulatory Visit: Payer: Self-pay

## 2017-06-18 ENCOUNTER — Observation Stay (HOSPITAL_COMMUNITY)
Admission: EM | Admit: 2017-06-18 | Discharge: 2017-06-20 | Disposition: A | Discharge status: Home / Self Care | Payer: 59 | Attending: Family Medicine | Admitting: Family Medicine

## 2017-06-18 DIAGNOSIS — F329 Major depressive disorder, single episode, unspecified: Secondary | ICD-10-CM | POA: Insufficient documentation

## 2017-06-18 DIAGNOSIS — D649 Anemia, unspecified: Secondary | ICD-10-CM | POA: Diagnosis not present

## 2017-06-18 DIAGNOSIS — Z9889 Other specified postprocedural states: Secondary | ICD-10-CM | POA: Insufficient documentation

## 2017-06-18 DIAGNOSIS — N3289 Other specified disorders of bladder: Secondary | ICD-10-CM | POA: Diagnosis not present

## 2017-06-18 DIAGNOSIS — IMO0001 Reserved for inherently not codable concepts without codable children: Secondary | ICD-10-CM | POA: Diagnosis present

## 2017-06-18 DIAGNOSIS — Z90411 Acquired partial absence of pancreas: Secondary | ICD-10-CM | POA: Insufficient documentation

## 2017-06-18 DIAGNOSIS — Z794 Long term (current) use of insulin: Secondary | ICD-10-CM | POA: Diagnosis not present

## 2017-06-18 DIAGNOSIS — E11649 Type 2 diabetes mellitus with hypoglycemia without coma: Secondary | ICD-10-CM | POA: Diagnosis not present

## 2017-06-18 DIAGNOSIS — E1169 Type 2 diabetes mellitus with other specified complication: Secondary | ICD-10-CM | POA: Diagnosis present

## 2017-06-18 DIAGNOSIS — I10 Essential (primary) hypertension: Secondary | ICD-10-CM | POA: Diagnosis not present

## 2017-06-18 DIAGNOSIS — M419 Scoliosis, unspecified: Secondary | ICD-10-CM | POA: Diagnosis not present

## 2017-06-18 DIAGNOSIS — M19042 Primary osteoarthritis, left hand: Secondary | ICD-10-CM | POA: Diagnosis not present

## 2017-06-18 DIAGNOSIS — E162 Hypoglycemia, unspecified: Secondary | ICD-10-CM

## 2017-06-18 DIAGNOSIS — N400 Enlarged prostate without lower urinary tract symptoms: Secondary | ICD-10-CM | POA: Diagnosis not present

## 2017-06-18 DIAGNOSIS — I700 Atherosclerosis of aorta: Secondary | ICD-10-CM | POA: Insufficient documentation

## 2017-06-18 DIAGNOSIS — E782 Mixed hyperlipidemia: Secondary | ICD-10-CM | POA: Insufficient documentation

## 2017-06-18 DIAGNOSIS — R680 Hypothermia, not associated with low environmental temperature: Secondary | ICD-10-CM | POA: Diagnosis not present

## 2017-06-18 DIAGNOSIS — Z79899 Other long term (current) drug therapy: Secondary | ICD-10-CM | POA: Diagnosis not present

## 2017-06-18 DIAGNOSIS — K219 Gastro-esophageal reflux disease without esophagitis: Secondary | ICD-10-CM | POA: Diagnosis not present

## 2017-06-18 DIAGNOSIS — Z981 Arthrodesis status: Secondary | ICD-10-CM | POA: Insufficient documentation

## 2017-06-18 DIAGNOSIS — Z8507 Personal history of malignant neoplasm of pancreas: Secondary | ICD-10-CM | POA: Diagnosis not present

## 2017-06-18 DIAGNOSIS — E1165 Type 2 diabetes mellitus with hyperglycemia: Secondary | ICD-10-CM

## 2017-06-18 DIAGNOSIS — T68XXXA Hypothermia, initial encounter: Secondary | ICD-10-CM | POA: Diagnosis present

## 2017-06-18 DIAGNOSIS — C250 Malignant neoplasm of head of pancreas: Secondary | ICD-10-CM | POA: Diagnosis present

## 2017-06-18 HISTORY — DX: Hypoglycemia, unspecified: E16.2

## 2017-06-18 LAB — COMPREHENSIVE METABOLIC PANEL
ALT: 31 U/L (ref 17–63)
AST: 26 U/L (ref 15–41)
Albumin: 4.1 g/dL (ref 3.5–5.0)
Alkaline Phosphatase: 114 U/L (ref 38–126)
Anion gap: 9 (ref 5–15)
BUN: 10 mg/dL (ref 6–20)
CO2: 23 mmol/L (ref 22–32)
Calcium: 9.1 mg/dL (ref 8.9–10.3)
Chloride: 107 mmol/L (ref 101–111)
Creatinine, Ser: 0.53 mg/dL — ABNORMAL LOW (ref 0.61–1.24)
GFR calc Af Amer: >60 mL/min (ref >60)
GFR calc non Af Amer: >60 mL/min (ref >60)
Glucose, Bld: 48 mg/dL — ABNORMAL LOW (ref 65–99)
Potassium: 3.3 mmol/L — ABNORMAL LOW (ref 3.5–5.1)
Sodium: 139 mmol/L (ref 135–145)
Total Bilirubin: 0.2 mg/dL — ABNORMAL LOW (ref 0.3–1.2)
Total Protein: 8.2 g/dL — ABNORMAL HIGH (ref 6.5–8.1)

## 2017-06-18 LAB — CBC WITH DIFFERENTIAL/PLATELET
Basophils Absolute: 0 10*3/uL (ref 0.0–0.1)
Basophils Relative: 0 %
Eosinophils Absolute: 0.1 10*3/uL (ref 0.0–0.7)
Eosinophils Relative: 1 %
HCT: 34.7 % — ABNORMAL LOW (ref 39.0–52.0)
Hemoglobin: 10.5 g/dL — ABNORMAL LOW (ref 13.0–17.0)
Lymphocytes Relative: 6 %
Lymphs Abs: 0.6 10*3/uL — ABNORMAL LOW (ref 0.7–4.0)
MCH: 24.2 pg — ABNORMAL LOW (ref 26.0–34.0)
MCHC: 30.3 g/dL (ref 30.0–36.0)
MCV: 80.1 fL (ref 78.0–100.0)
Monocytes Absolute: 0.7 10*3/uL (ref 0.1–1.0)
Monocytes Relative: 6 %
Neutro Abs: 9.6 10*3/uL — ABNORMAL HIGH (ref 1.7–7.7)
Neutrophils Relative %: 87 %
Platelets: 313 10*3/uL (ref 150–400)
RBC: 4.33 MIL/uL (ref 4.22–5.81)
RDW: 15 % (ref 11.5–15.5)
WBC: 11.1 10*3/uL — ABNORMAL HIGH (ref 4.0–10.5)

## 2017-06-18 LAB — CBG MONITORING, ED
Glucose-Capillary: 117 mg/dL — ABNORMAL HIGH (ref 65–99)
Glucose-Capillary: 46 mg/dL — ABNORMAL LOW (ref 65–99)
Glucose-Capillary: 54 mg/dL — ABNORMAL LOW (ref 65–99)

## 2017-06-18 LAB — I-STAT CHEM 8, ED
BUN: 12 mg/dL (ref 6–20)
Calcium, Ion: 1.12 mmol/L — ABNORMAL LOW (ref 1.15–1.40)
Chloride: 107 mmol/L (ref 101–111)
Creatinine, Ser: 0.5 mg/dL — ABNORMAL LOW (ref 0.61–1.24)
Glucose, Bld: 46 mg/dL — ABNORMAL LOW (ref 65–99)
HCT: 35 % — ABNORMAL LOW (ref 39.0–52.0)
Hemoglobin: 11.9 g/dL — ABNORMAL LOW (ref 13.0–17.0)
Potassium: 3.3 mmol/L — ABNORMAL LOW (ref 3.5–5.1)
Sodium: 142 mmol/L (ref 135–145)
TCO2: 25 mmol/L (ref 22–32)

## 2017-06-18 LAB — I-STAT CG4 LACTIC ACID, ED: Lactic Acid, Venous: 1.63 mmol/L (ref 0.5–1.9)

## 2017-06-18 MED ORDER — SODIUM CHLORIDE 0.9 % IV BOLUS (SEPSIS)
1000.0000 mL | Freq: Once | INTRAVENOUS | Status: AC
  Administered 2017-06-18: 1000 mL via INTRAVENOUS

## 2017-06-18 MED ORDER — SODIUM CHLORIDE 0.9% FLUSH: 3.0000 mL | Freq: Two times a day (BID) | INTRAVENOUS | Status: DC

## 2017-06-18 MED ORDER — SODIUM CHLORIDE 0.9% FLUSH: 3.0000 mL | INTRAVENOUS | Status: DC | PRN

## 2017-06-18 MED ORDER — DEXTROSE 50 % IV SOLN
1.0000 | Freq: Once | INTRAVENOUS | Status: AC
  Administered 2017-06-18: 50 mL via INTRAVENOUS
  Filled 2017-06-18: qty 50

## 2017-06-18 MED ORDER — DEXTROSE 5 % IV SOLN
Freq: Once | INTRAVENOUS | Status: AC
  Administered 2017-06-18: 23:00:00 via INTRAVENOUS

## 2017-06-18 MED ORDER — SODIUM CHLORIDE 0.9 % IV SOLN: 250.0000 mL | INTRAVENOUS | Status: DC | PRN

## 2017-06-18 NOTE — ED Notes (Signed)
Patient denies pain and is resting comfortably.  

## 2017-06-18 NOTE — ED Notes (Addendum)
Bair hugger applied. Wife at bedside. All questions answered.

## 2017-06-18 NOTE — ED Provider Notes (Signed)
[facility] EMERGENCY DEPARTMENT Provider Note   CSN: [id number] Arrival date & time: 06/18/17  2154     History   Chief Complaint Chief Complaint  Patient presents with  . Hypoglycemia  . Altered Mental Status    HPI Colton Bush is a 61 y.o. male with history significant for pancreatic cancer, diabetes,hypertension presenting via EMS after being found at home unresponsive and hypoglycemic. Was given D5 by EMS and blood glucose stabilized. Doesn't recall any of the events and reports that he was feeling in his normal state of health prior to this episode. He reports recent pancreatic surgery to remove malignant tumor. Denies any abdominal pain, nausea, vomiting, fever, chills or recent illness,denying any pain. He reports feeling somewhat disoriented and doesn't know what happened.  HPI  Past Medical History:  Diagnosis Date  . Arthritis    left hand  . Bronchitis 1977  . Cancer ([place]) 03/09/2016   pancreatic cancer  . Depression    takes Cymbalta daily  . Diabetes mellitus type II    sees [name]   . GERD (gastroesophageal reflux disease)    takes Omeprazole daily  . H/O hiatal hernia   . Hyperlipidemia    takes Zocor daily  . Hypertension    takes Amlodipine daily  . Neck pain    C4-7 stenosis and herniated disc  . Neuromuscular disorder ([place])    hiatal hernia  . Scoliosis    slight  . Spinal cord injury, C5-C7 ([place])    c4-c7  . Stiffness of hand joint    d/t cervical issues    Patient Active Problem List   Diagnosis Date Noted  . Goals of care, counseling/discussion 09/29/2016  . Port catheter in place 04/11/2016  . Hypercalcemia 03/29/2016  . Adenocarcinoma of head of pancreas ([place]) 03/17/2016  . Biliary obstruction   . Obstructive jaundice due to malignant neoplasm ([place]) 02/27/2016  . DKA (diabetic ketoacidoses) ([place]) 02/27/2016  . Diabetes mellitus type 2, uncontrolled, without complications ([place]) 22/08/5425  . Cervical  spondylosis with myelopathy 08/18/2011  . CERUMEN IMPACTION 12/14/2008  . Hyperlipidemia, mixed 09/16/2007  . Essential hypertension 09/16/2007  . GERD 09/16/2007  . ESOPHAGEAL STRICTURE 04/04/2007  . HIATAL HERNIA 04/04/2007    Past Surgical History:  Procedure Laterality Date  . ANTERIOR CERVICAL DECOMPRESSION/DISCECTOMY FUSION 3 LEVELS N/A 08/18/2011   Performed by Winfield Cunas, MD at [facility] NEURO ORS  . CARPAL TUNNEL RELEASE  2013   bilateral, per Dr. Christella Noa   . COLONOSCOPY  10-30-14   per Dr. Olevia Perches, clear, repeat in 10 yrs   . DIAGNOSTIC LAPAROSCOPY, LAPAROSCOPIC LIVER BIOPSY, RETROPERITONEAL EXPLORATION, INTRAOPERATIVE ULTRASOUND N/A 09/19/2016   Performed by Stark Klein, MD at [facility] with esophageal dilation  9-08   per Dr. Olevia Perches  . ENDOSCOPIC RETROGRADE CHOLANGIOPANCREATOGRAPHY (ERCP) with brushings and stent N/A 03/01/2016   Performed by Doran Stabler, MD at [facility]  . ESOPHAGEAL ENDOSCOPIC ULTRASOUND (EUS) RADIAL N/A 03/09/2016   Performed by Milus Banister, MD at [facility]  . INSERTION PORT-A-CATH Left 03/22/2016   Performed by Stark Klein, MD at [facility] ORS  . lymph nodes biopsy    . melanoma rt calf  1999  . SPINE SURGERY    . TONSILLECTOMY     as a child  . ULNAR TUNNEL RELEASE  2013   right arm, per Dr. Christella Noa   . UPPER GASTROINTESTINAL ENDOSCOPY  Home Medications    Prior to Admission medications   Medication Sig Start Date End Date Taking? Authorizing Provider  amLODipine (NORVASC) 10 MG tablet TAKE 1 TABLET BY MOUTH EVERY DAY 01/30/17  Yes [name], MD  benazepril (LOTENSIN) 10 MG tablet Take 1 tablet (10 mg total) by mouth daily. 04/24/17  Yes [name], MD  calcium-vitamin D (OSCAL WITH D) 500-200 MG-UNIT tablet Take 1 tablet daily with breakfast by mouth.   Yes [provider]  CREON 36000 units CPEP capsule Take 2 capsules by mouth 3 (three) times daily before meals. Plus 1 capsule prior to snacks 01/13/17  Yes  [provider]  ferrous sulfate 325 (65 FE) MG tablet Take 325 mg daily with breakfast by mouth.   Yes [provider]  FREESTYLE LITE test strip USE ONE STRIP TO CHECK GLUCOSE ONCE DAILY. PLEASE  SCHEDULE FOLLOW UP 11/09/16  Yes [name], MD  insulin aspart (NOVOLOG) 100 UNIT/ML injection Give every morning with breakfast and every evening with supper per the following sliding scale: give 3 units for glucose 150-175, give 6 units for glucose 176-200, give 9 units for glucose 201-250, give 12 units for glucose greater than 250 03/28/16  Yes [name], MD  Insulin Detemir (LEVEMIR) 100 UNIT/ML Pen Inject 15 Units into the skin 2 (two) times daily. Patient taking differently: Inject 40-60 Units 2 (two) times daily into the skin. 60 units am- 40 units pm 09/25/16  Yes Stark Klein, MD  Insulin Pen Needle 30G X 5 MM MISC Use one daily with insulin 03/04/16  Yes Copland, Gay Filler, MD  Lancets (FREESTYLE) lancets USE AS INSTRUCTED TO CHECK BLOOD SUGAR ONCE A DAY 06/02/16  Yes [name], MD  lidocaine-prilocaine (EMLA) cream Apply to affected area once 10/06/16  Yes Truitt Merle, MD  loratadine (CLARITIN) 10 MG tablet Take 10 mg by mouth daily.   Yes [provider]  magnesium oxide (MAG-OX) 400 (241.3 Mg) MG tablet TAKE 1 TABLET (400 MG TOTAL) BY MOUTH DAILY. 04/05/17  Yes Truitt Merle, MD  Multiple Vitamin (MULTIVITAMIN WITH MINERALS) TABS tablet Take 1 tablet by mouth daily.   Yes [provider]  naproxen sodium (ANAPROX) 220 MG tablet Take 440 mg by mouth 2 (two) times daily as needed (for pain.).   Yes [provider]  omeprazole (PRILOSEC) 20 MG capsule TAKE ONE CAPSULE BY MOUTH ONCE DAILY 08/28/16  Yes [name], MD  potassium chloride SA (KLOR-CON M20) 20 MEQ tablet Take 1 tablet (20 mEq total) by mouth daily. 04/27/17  Yes Truitt Merle, MD  JARDIANCE 25 MG TABS tablet TAKE 1 TABLET BY MOUTH EVERY DAY Patient not taking: Reported on 06/18/2017 03/31/16    [name], MD  LORazepam (ATIVAN) 0.5 MG tablet Take 1 tablet (0.5 mg total) by mouth every 6 (six) hours as needed (Nausea or vomiting). Patient not taking: Reported on 06/18/2017 10/06/16   Truitt Merle, MD  NOVOLOG FLEXPEN 100 UNIT/ML FlexPen GIVE EVERY MORNING WITH BREAKFAST AND EVERY EVENING WITH SUPPER PER SLIDING SCALE Patient not taking: Reported on 06/18/2017 04/17/17   [name], MD  Omega-3 Fatty Acids (FISH OIL) 1200 MG CAPS Take 2,400 mg by mouth daily.    [provider]  ondansetron (ZOFRAN) 8 MG tablet Take 1 tablet (8 mg total) by mouth 2 (two) times daily as needed (Nausea or vomiting). Patient not taking: Reported on 06/18/2017 10/06/16   Truitt Merle, MD  oxyCODONE (OXY IR/ROXICODONE) 5  MG immediate release tablet Take 1-2 tablets (5-10 mg total) by mouth every 4 (four) hours as needed for moderate pain, severe pain or breakthrough pain. Patient not taking: Reported on 06/18/2017 09/25/16   Stark Klein, MD  prochlorperazine (COMPAZINE) 10 MG tablet Take 1 tablet (10 mg total) by mouth every 6 (six) hours as needed (Nausea or vomiting). Patient not taking: Reported on 06/18/2017 10/06/16   Truitt Merle, MD    Family History Family History  Problem Relation Age of Onset  . Heart disease Father   . Heart disease Brother 53  . Anesthesia problems Mother   . Heart disease Mother   . Dementia Mother   . Diabetes Sister   . Stroke Sister   . Colon cancer Neg Hx   . Rectal cancer Neg Hx   . Stomach cancer Neg Hx     Social History Social History   Tobacco Use  . Smoking status: Never Smoker  . Smokeless tobacco: Never Used  . Tobacco comment: tried a pipe 35 years ago   Substance Use Topics  . Alcohol use: No    Alcohol/week: 0.0 oz  . Drug use: No     Allergies   No known allergies   Review of Systems Review of Systems  Constitutional: Negative for chills, diaphoresis and fever.  HENT: Negative for ear pain and sore throat.   Eyes: Negative for pain  and visual disturbance.  Respiratory: Negative for cough, choking, chest tightness, shortness of breath, wheezing and stridor.   Cardiovascular: Negative for chest pain, palpitations and leg swelling.  Gastrointestinal: Negative for abdominal distention, abdominal pain, diarrhea, nausea and vomiting.  Genitourinary: Negative for dysuria and hematuria.  Musculoskeletal: Negative for arthralgias, back pain and myalgias.  Skin: Positive for wound. Negative for color change, pallor and rash.       Patient has laparotomy incision scar  Neurological: Negative for dizziness, tremors, seizures, facial asymmetry, speech difficulty, weakness, light-headedness, numbness and headaches.     Physical Exam Updated Vital Signs BP 106/71   Pulse 69   Temp (!) 93.5 F (34.2 C) (Oral)   Resp 13   Ht 5\' 11"  (1.803 m)   Wt 90.7 kg (200 lb)   SpO2 94%   BMI 27.89 kg/m   Physical Exam  Constitutional: He is oriented to person, place, and time. He appears well-developed and well-nourished. No distress.  Rectal temperature 91.2 on arrival, patient is alert and oriented 4, nontoxic in no acute distress.  HENT:  Head: Normocephalic and atraumatic.  Mouth/Throat: Oropharynx is clear and moist. No oropharyngeal exudate.  Eyes: Conjunctivae and EOM are normal. Pupils are equal, round, and reactive to light. Right eye exhibits no discharge. Left eye exhibits no discharge. No scleral icterus.  Neck: Normal range of motion. Neck supple.  Cardiovascular: Normal rate, regular rhythm, normal heart sounds and intact distal pulses.  No murmur heard. Pulmonary/Chest: Effort normal and breath sounds normal. No stridor. No respiratory distress. He has no wheezes. He has no rales.  Abdominal: Soft. Bowel sounds are normal. He exhibits no distension and no mass. There is no tenderness. There is no rebound and no guarding.  Non-tender to palpation of the abdomen, laparotomy scar with erythema and mild purulence    Musculoskeletal: Normal range of motion. He exhibits no edema, tenderness or deformity.  Neurological: He is alert and oriented to person, place, and time. No cranial nerve deficit or sensory deficit. He exhibits normal muscle tone. Coordination normal.  Neurologic Exam:  -  Mental status: Patient is alert and cooperative. Fluent speech and words are clear. Coherent thought processes and insight is good. Patient is oriented x 4 to person, place, time and event.  - Cranial nerves:  CN III, IV, VI: pupils equally round, reactive to light both direct and conscensual. Full extra-ocular movement. CN V: motor temporalis and masseter strength intact. CN VII : muscles of facial expression intact. CN X :  midline uvula. XI strength of sternocleidomastoid and trapezius muscles 5/5, XII: tongue is midline when protruded. - Motor: No involuntary movements. Muscle tone and bulk normal throughout. Muscle strength is 5/5 in bilateral shoulder abduction, elbow flexion and extension, grip, hip extension, flexion, leg flexion and extension, ankle dorsiflexion and plantar flexion.  - Sensory: Proprioception, light tough sensation intact in all extremities.  - Cerebellar: rapid alternating movements and point to point movement intact in upper and lower extremities. Normal stance and gait.  Skin: Skin is warm and dry. No rash noted. He is not diaphoretic. There is erythema. No pallor.  Erythema around the surgical incision site and small areas of dehiscence and purulence  Psychiatric: He has a normal mood and affect.  Nursing note and vitals reviewed.    ED Treatments / Results  Labs (all labs ordered are listed, but only abnormal results are displayed) Labs Reviewed  CBC WITH DIFFERENTIAL/PLATELET - Abnormal; Notable for the following components:      Result Value   WBC 11.1 (*)    Hemoglobin 10.5 (*)    HCT 34.7 (*)    MCH 24.2 (*)    Neutro Abs 9.6 (*)    Lymphs Abs 0.6 (*)    All other components within  normal limits  COMPREHENSIVE METABOLIC PANEL - Abnormal; Notable for the following components:   Potassium 3.3 (*)    Glucose, Bld 48 (*)    Creatinine, Ser 0.53 (*)    Total Protein 8.2 (*)    Total Bilirubin 0.2 (*)    All other components within normal limits  CBG MONITORING, ED - Abnormal; Notable for the following components:   Glucose-Capillary 54 (*)    All other components within normal limits  I-STAT CHEM 8, ED - Abnormal; Notable for the following components:   Potassium 3.3 (*)    Creatinine, Ser 0.50 (*)    Glucose, Bld 46 (*)    Calcium, Ion 1.12 (*)    Hemoglobin 11.9 (*)    HCT 35.0 (*)    All other components within normal limits  CBG MONITORING, ED - Abnormal; Notable for the following components:   Glucose-Capillary 46 (*)    All other components within normal limits  CBG MONITORING, ED - Abnormal; Notable for the following components:   Glucose-Capillary 117 (*)    All other components within normal limits  I-STAT CG4 LACTIC ACID, ED - Abnormal; Notable for the following components:   Lactic Acid, Venous 1.98 (*)    All other components within normal limits  CULTURE, BLOOD (ROUTINE X 2)  CULTURE, BLOOD (ROUTINE X 2)  CORTISOL  URINALYSIS, ROUTINE W REFLEX MICROSCOPIC  I-STAT CG4 LACTIC ACID, ED    EKG  EKG Interpretation None       Radiology No results found.  Procedures Procedures (including critical care time)  Medications Ordered in ED Medications  sodium chloride flush (NS) 0.9 % injection 3 mL (not administered)  sodium chloride flush (NS) 0.9 % injection 3 mL (not administered)  0.9 %  sodium chloride infusion (not administered)  piperacillin-tazobactam (  ZOSYN) IVPB 3.375 g (not administered)  sodium chloride 0.9 % bolus 1,000 mL (0 mLs Intravenous Stopped 06/19/17 0048)  dextrose 50 % solution 50 mL (50 mLs Intravenous Given 06/18/17 2300)  dextrose 5 % solution ( Intravenous New Bag/Given 06/18/17 2300)     Initial Impression /  Assessment and Plan / ED Course  I have reviewed the triage vital signs and the nursing notes.  Pertinent labs & imaging results that were available during my care of the patient were reviewed by me and considered in my medical decision making (see chart for details).    Patient presented via EMS after being found unresponsive and hypoglycemic. Hypothermic on arrival with temp of 91.2 rectal. glucose dropped again to 54 on my assessment.  Patient denying any prodrome illness or over use of insulin. His insulin was recently reduced by PCP due to low glucose readings in the mornings. He was in his usual state of health up until this episode.  Denies any fever, chills, nausea, vomiting, abdominal pain, alert and oriented and normal neuro exam.  Obtain labs, started IV fluids, blanket warmer and reassess.  On reassessment, patient was resting comfortably. Wife now at bedside reporting that she was at home and called EMS when episode occurred. He was sitting at the dinning room table signing checks when he reported feeling extremely tired and staring in space and became unresponsive.  Initial lactate negative. Temp starting to trend up Last cbg 117  Started broad spectrum abx in ED.  Consulted case management to assist with financial assistance related to admission.  Consulted Hospitalist for admission and spoke to Dr. Hal Hope who accepted patient for admission.  Patient stable and comfortable at time of transfer. Pending urine and CT abdomen.  Patient was discussed with Dr. Eulis Foster who has seen patient and agrees with assessment and plan.  Final Clinical Impressions(s) / ED Diagnoses   Final diagnoses:  Hypoglycemia  Hypothermia, initial encounter    ED Discharge Orders    None       Dossie Der 06/19/17 0054    Daleen Bo, MD 06/19/17 [phone]

## 2017-06-18 NOTE — ED Triage Notes (Signed)
Pt arrives via EMS from home called out for unresponsive patient. CBG initially 38. Received D10 25g 18g lhand. Pt awake, lethargic, able to state name and place but unsure of date. Denies recent fever. Family also concerned for abdominal wound drainage they state is new today. Recheck CBG 157.

## 2017-06-18 NOTE — ED Provider Notes (Signed)
  Face-to-face evaluation   History: Patient presenting for confusion associated with hypoglycemia.  He states that last week his been lowered his morning Levemir because of low blood sugars.  He recalls blood sugar being 58 this morning so he did not take NovoLog, but did take his morning Levemir, 60 units.  At 5:30 PM today, he took his evening dose of Levemir, 40 units, and he took 6 units of NovoLog for a blood sugar of 130.  He subsequently ate, then became confused.  Physical exam: Alert, calm, cooperative.  He is lucid.  He moves all extremities equally.  Abdomen is somewhat different, his wife states that his baseline.  Midline surgical scar is healing, with some areas of erythema and superficial skin breakdown.  No sign of fluctuance, tenderness associated, or deep tissue infection.  The patient states his abdomen has its normal appearance at this time.    Medical screening examination/treatment/procedure(s) were conducted as a shared visit with non-physician practitioner(s) and myself.  I personally evaluated the patient during the encounter   Daleen Bo, MD 06/19/17 [phone]

## 2017-06-19 ENCOUNTER — Other Ambulatory Visit: Payer: Self-pay

## 2017-06-19 ENCOUNTER — Encounter (HOSPITAL_COMMUNITY): Payer: Self-pay | Admitting: Internal Medicine

## 2017-06-19 ENCOUNTER — Observation Stay (HOSPITAL_COMMUNITY): Payer: 59

## 2017-06-19 DIAGNOSIS — E162 Hypoglycemia, unspecified: Secondary | ICD-10-CM | POA: Diagnosis not present

## 2017-06-19 DIAGNOSIS — T68XXXA Hypothermia, initial encounter: Secondary | ICD-10-CM | POA: Diagnosis not present

## 2017-06-19 LAB — BLOOD CULTURE ID PANEL (REFLEXED)

## 2017-06-19 LAB — BASIC METABOLIC PANEL
Anion gap: 8 (ref 5–15)
BUN: 10 mg/dL (ref 6–20)
CO2: 23 mmol/L (ref 22–32)
Calcium: 8.9 mg/dL (ref 8.9–10.3)
Chloride: 106 mmol/L (ref 101–111)
Creatinine, Ser: 0.58 mg/dL — ABNORMAL LOW (ref 0.61–1.24)
GFR calc Af Amer: >60 mL/min (ref >60)
GFR calc non Af Amer: >60 mL/min (ref >60)
Glucose, Bld: 186 mg/dL — ABNORMAL HIGH (ref 65–99)
Potassium: 3.8 mmol/L (ref 3.5–5.1)
Sodium: 137 mmol/L (ref 135–145)

## 2017-06-19 LAB — CBC
HCT: 30.2 % — ABNORMAL LOW (ref 39.0–52.0)
Hemoglobin: 9.4 g/dL — ABNORMAL LOW (ref 13.0–17.0)
MCH: 24.7 pg — ABNORMAL LOW (ref 26.0–34.0)
MCHC: 31.1 g/dL (ref 30.0–36.0)
MCV: 79.3 fL (ref 78.0–100.0)
Platelets: 332 10*3/uL (ref 150–400)
RBC: 3.81 MIL/uL — ABNORMAL LOW (ref 4.22–5.81)
RDW: 15.5 % (ref 11.5–15.5)
WBC: 6.3 10*3/uL (ref 4.0–10.5)

## 2017-06-19 LAB — URINALYSIS, ROUTINE W REFLEX MICROSCOPIC
Bilirubin Urine: NEGATIVE
Glucose, UA: 50 mg/dL — AB
Hgb urine dipstick: NEGATIVE
Ketones, ur: NEGATIVE mg/dL
Leukocytes, UA: NEGATIVE
Nitrite: NEGATIVE
Protein, ur: NEGATIVE mg/dL
Specific Gravity, Urine: 1.045 — ABNORMAL HIGH (ref 1.005–1.030)
pH: 5 (ref 5.0–8.0)

## 2017-06-19 LAB — CBG MONITORING, ED
Glucose-Capillary: 118 mg/dL — ABNORMAL HIGH (ref 65–99)
Glucose-Capillary: 126 mg/dL — ABNORMAL HIGH (ref 65–99)
Glucose-Capillary: 137 mg/dL — ABNORMAL HIGH (ref 65–99)
Glucose-Capillary: 142 mg/dL — ABNORMAL HIGH (ref 65–99)
Glucose-Capillary: 164 mg/dL — ABNORMAL HIGH (ref 65–99)
Glucose-Capillary: 171 mg/dL — ABNORMAL HIGH (ref 65–99)
Glucose-Capillary: 218 mg/dL — ABNORMAL HIGH (ref 65–99)

## 2017-06-19 LAB — CORTISOL: Cortisol, Plasma: 19.1 ug/dL

## 2017-06-19 LAB — HIV ANTIBODY (ROUTINE TESTING W REFLEX): HIV Screen 4th Generation wRfx: NONREACTIVE

## 2017-06-19 LAB — I-STAT CG4 LACTIC ACID, ED: Lactic Acid, Venous: 1.98 mmol/L — ABNORMAL HIGH (ref 0.5–1.9)

## 2017-06-19 LAB — GLUCOSE, CAPILLARY
Glucose-Capillary: 168 mg/dL — ABNORMAL HIGH (ref 65–99)
Glucose-Capillary: 143 mg/dL — ABNORMAL HIGH (ref 65–99)

## 2017-06-19 MED ORDER — PANTOPRAZOLE SODIUM 40 MG PO TBEC
40.0000 mg | DELAYED_RELEASE_TABLET | Freq: Every day | ORAL | Status: DC
  Administered 2017-06-19 – 2017-06-20 (×2): 40 mg via ORAL
  Filled 2017-06-19 (×2): qty 1

## 2017-06-19 MED ORDER — PANCRELIPASE (LIP-PROT-AMYL) 12000-38000 UNITS PO CPEP
72000.0000 [IU] | ORAL_CAPSULE | Freq: Three times a day (TID) | ORAL | Status: DC
  Administered 2017-06-19 – 2017-06-20 (×5): 72000 [IU] via ORAL
  Filled 2017-06-19: qty 2
  Filled 2017-06-19 (×2): qty 6
  Filled 2017-06-19: qty 2
  Filled 2017-06-19 (×2): qty 6
  Filled 2017-06-19: qty 2

## 2017-06-19 MED ORDER — IOPAMIDOL (ISOVUE-300) INJECTION 61%
INTRAVENOUS | Status: AC
  Administered 2017-06-19: 100 mL
  Filled 2017-06-19: qty 100

## 2017-06-19 MED ORDER — ONDANSETRON HCL 4 MG/2ML IJ SOLN: 4.0000 mg | Freq: Four times a day (QID) | INTRAMUSCULAR | Status: DC | PRN

## 2017-06-19 MED ORDER — MAGNESIUM OXIDE 400 (241.3 MG) MG PO TABS
400.0000 mg | ORAL_TABLET | Freq: Every day | ORAL | Status: DC
  Administered 2017-06-19 – 2017-06-20 (×2): 400 mg via ORAL
  Filled 2017-06-19 (×2): qty 1

## 2017-06-19 MED ORDER — ENOXAPARIN SODIUM 40 MG/0.4ML ~~LOC~~ SOLN
40.0000 mg | SUBCUTANEOUS | Status: DC
  Administered 2017-06-19 – 2017-06-20 (×2): 40 mg via SUBCUTANEOUS
  Filled 2017-06-19 (×3): qty 0.4

## 2017-06-19 MED ORDER — AMLODIPINE BESYLATE 10 MG PO TABS
10.0000 mg | ORAL_TABLET | Freq: Every day | ORAL | Status: DC
  Administered 2017-06-19 – 2017-06-20 (×2): 10 mg via ORAL
  Filled 2017-06-19: qty 2
  Filled 2017-06-19: qty 1

## 2017-06-19 MED ORDER — FERROUS SULFATE 325 (65 FE) MG PO TABS
325.0000 mg | ORAL_TABLET | Freq: Every day | ORAL | Status: DC
  Administered 2017-06-19 – 2017-06-20 (×2): 325 mg via ORAL
  Filled 2017-06-19 (×2): qty 1

## 2017-06-19 MED ORDER — DEXTROSE-NACL 5-0.9 % IV SOLN
INTRAVENOUS | Status: DC
  Administered 2017-06-19: 03:00:00 via INTRAVENOUS

## 2017-06-19 MED ORDER — POTASSIUM CHLORIDE CRYS ER 20 MEQ PO TBCR
20.0000 meq | EXTENDED_RELEASE_TABLET | Freq: Every day | ORAL | Status: DC
  Administered 2017-06-19 – 2017-06-20 (×2): 20 meq via ORAL
  Filled 2017-06-19 (×2): qty 1

## 2017-06-19 MED ORDER — BENAZEPRIL HCL 10 MG PO TABS
10.0000 mg | ORAL_TABLET | Freq: Every day | ORAL | Status: DC
  Administered 2017-06-19 – 2017-06-20 (×2): 10 mg via ORAL
  Filled 2017-06-19 (×2): qty 1

## 2017-06-19 MED ORDER — ACETAMINOPHEN 325 MG PO TABS: 650.0000 mg | ORAL_TABLET | Freq: Four times a day (QID) | ORAL | Status: DC | PRN

## 2017-06-19 MED ORDER — INSULIN ASPART 100 UNIT/ML ~~LOC~~ SOLN
3.0000 [IU] | Freq: Three times a day (TID) | SUBCUTANEOUS | Status: DC
  Administered 2017-06-19 – 2017-06-20 (×4): 3 [IU] via SUBCUTANEOUS

## 2017-06-19 MED ORDER — PIPERACILLIN-TAZOBACTAM 3.375 G IVPB
3.3750 g | Freq: Three times a day (TID) | INTRAVENOUS | Status: DC
  Administered 2017-06-19: 3.375 g via INTRAVENOUS
  Filled 2017-06-19 (×2): qty 50

## 2017-06-19 MED ORDER — INSULIN ASPART 100 UNIT/ML ~~LOC~~ SOLN
0.0000 [IU] | Freq: Three times a day (TID) | SUBCUTANEOUS | Status: DC
  Administered 2017-06-19: 2 [IU] via SUBCUTANEOUS
  Administered 2017-06-20: 3 [IU] via SUBCUTANEOUS
  Administered 2017-06-20 (×2): 2 [IU] via SUBCUTANEOUS

## 2017-06-19 MED ORDER — OMEGA-3-ACID ETHYL ESTERS 1 G PO CAPS
2000.0000 mg | ORAL_CAPSULE | Freq: Every day | ORAL | Status: DC
  Administered 2017-06-19 – 2017-06-20 (×2): 2000 mg via ORAL
  Filled 2017-06-19 (×2): qty 2

## 2017-06-19 MED ORDER — CALCIUM CARBONATE-VITAMIN D 500-200 MG-UNIT PO TABS
1.0000 | ORAL_TABLET | Freq: Every day | ORAL | Status: DC
  Administered 2017-06-19 – 2017-06-20 (×2): 1 via ORAL
  Filled 2017-06-19 (×2): qty 1

## 2017-06-19 MED ORDER — ACETAMINOPHEN 650 MG RE SUPP: 650.0000 mg | Freq: Four times a day (QID) | RECTAL | Status: DC | PRN

## 2017-06-19 MED ORDER — ONDANSETRON HCL 4 MG PO TABS
4.0000 mg | ORAL_TABLET | Freq: Four times a day (QID) | ORAL | Status: DC | PRN
Start: 2017-06-19 — End: 2017-06-20

## 2017-06-19 NOTE — Progress Notes (Signed)
PHARMACY - PHYSICIAN COMMUNICATION CRITICAL VALUE ALERT - BLOOD CULTURE IDENTIFICATION (BCID)  Results for orders placed or performed during the hospital encounter of 06/18/17  Blood Culture ID Panel (Reflexed) (Collected: 06/18/2017 11:00 PM)  Result Value Ref Range   Enterococcus species NOT DETECTED NOT DETECTED   Listeria monocytogenes NOT DETECTED NOT DETECTED   Staphylococcus species DETECTED (A) NOT DETECTED   Staphylococcus aureus NOT DETECTED NOT DETECTED   Methicillin resistance NOT DETECTED NOT DETECTED   Streptococcus species NOT DETECTED NOT DETECTED   Streptococcus agalactiae NOT DETECTED NOT DETECTED   Streptococcus pneumoniae NOT DETECTED NOT DETECTED   Streptococcus pyogenes NOT DETECTED NOT DETECTED   Acinetobacter baumannii NOT DETECTED NOT DETECTED   Enterobacteriaceae species NOT DETECTED NOT DETECTED   Enterobacter cloacae complex NOT DETECTED NOT DETECTED   Escherichia coli NOT DETECTED NOT DETECTED   Klebsiella oxytoca NOT DETECTED NOT DETECTED   Klebsiella pneumoniae NOT DETECTED NOT DETECTED   Proteus species NOT DETECTED NOT DETECTED   Serratia marcescens NOT DETECTED NOT DETECTED   Haemophilus influenzae NOT DETECTED NOT DETECTED   Neisseria meningitidis NOT DETECTED NOT DETECTED   Pseudomonas aeruginosa NOT DETECTED NOT DETECTED   Candida albicans NOT DETECTED NOT DETECTED   Candida glabrata NOT DETECTED NOT DETECTED   Candida krusei NOT DETECTED NOT DETECTED   Candida parapsilosis NOT DETECTED NOT DETECTED   Candida tropicalis NOT DETECTED NOT DETECTED    Name of physician (or Provider) Contacted: [facility] extender K Schorr - text page sent  Changes to prescribed antibiotics required: No changes needed WBC wnl, afebrile, abx stopped this am, 1/2 Bcx with CNS - probably contaminant.   Bonnita Nasuti Pharm.D. CPP, BCPS Clinical Pharmacist [phone] 06/19/2017 8:31 PM

## 2017-06-19 NOTE — ED Notes (Signed)
Patient denies pain and is resting comfortably.  

## 2017-06-19 NOTE — Progress Notes (Signed)
8:41 AM I agree with HPI/GPe and A/P per Dr. Hal Hope    61 m, pancreatic ca foll dr [name] stg iib [t2, n 1,m0] diag 02/2016   S/p Whipple attempt-tumour encasing smv/pv--then went to [facility] 11/17/16 for re-attempt Dr. Mariah Milling   + on Gemcitabine  Some previous low cbg at [facility] 06/08/17 for the past cpl weeks--recent increases of Levemir at OP--states was place don 60 u bid levemir but has been downward adjusting the nightdose to 40 u Sugars have been well below 100 per patient for past couple days Eating fair  No fever no chills no n/v/cp No dysuria no cough no cold  HEENT alert intact CHEST cta b no added sound CARDIAC s1 s2 hsm--Port in L ant chest wall ABDOMEN soft nt nd no rebound  Patient Active Problem List   Diagnosis Date Noted  . Hypoglycemia 06/19/2017  . Hypothermia 06/19/2017  . Goals of care, counseling/discussion 09/29/2016  . Port catheter in place 04/11/2016  . Hypercalcemia 03/29/2016  . Adenocarcinoma of head of pancreas ([place]) 03/17/2016  . Biliary obstruction   . Obstructive jaundice due to malignant neoplasm ([place]) 02/27/2016  . DKA (diabetic ketoacidoses) ([place]) 02/27/2016  . Diabetes mellitus type 2, uncontrolled, without complications ([place]) 11/55/2080  . Cervical spondylosis with myelopathy 08/18/2011  . CERUMEN IMPACTION 12/14/2008  . Hyperlipidemia, mixed 09/16/2007  . Essential hypertension 09/16/2007  . GERD 09/16/2007  . ESOPHAGEAL STRICTURE 04/04/2007  . HIATAL HERNIA 04/04/2007   p D/c d5 saline-check cbg x 3 more times--if all above 100, will cautiously re-implement levemir tonight at 1/2 reg dose of ~ 15-20 U If no further issue and stabilzing can go to Med-surg and expect can ultimately d/c home am  Verneita Griffes, MD [facility] [phone]

## 2017-06-19 NOTE — Progress Notes (Signed)
Pharmacy Antibiotic Note  Colton Bush is a 61 y.o. male admitted on 06/18/2017 after found unresponsive, now with suspected intra-abdominal infection. Pharmacy has been consulted for Zosyn dosing.  WBC 11.1, LA 1.98, and temp 93.5. SCr 0.5 for estimated CrCl >90 mL/min.   Per office visit notes from this month, patient with recent weight loss of 50 lbs in past 4 months.   Plan: Zosyn 3.375g IV q8hr Monitor renal function, clinical picture, and cultures  F/u length of therapy   Height: 5\' 11"  (180.3 cm) Weight: 200 lb (90.7 kg) IBW/kg (Calculated) : 75.3  Temp (24hrs), Avg:92.4 F (33.6 C), Min:91.2 F (32.9 C), Max:93.5 F (34.2 C)  Recent Labs  Lab 06/18/17 2226 06/18/17 2257 06/19/17 0017  WBC 11.1*  --   --   CREATININE 0.53* 0.50*  --   LATICACIDVEN  --  1.63 1.98*    Estimated Creatinine Clearance: 108.9 mL/min (A) (by C-G formula based on SCr of 0.5 mg/dL (L)).    Allergies  Allergen Reactions  . No Known Allergies     Antimicrobials this admission: 11/20 Zosyn >>   Microbiology results: pending   Lavonda Jumbo, PharmD Clinical Pharmacist 06/19/17 12:48 AM

## 2017-06-19 NOTE — ED Notes (Signed)
Verbal from admitting to dc dextrose drip

## 2017-06-19 NOTE — H&P (Signed)
History and Physical    Colton Bush GEX:[id number] DOB: Nov 08, 1955 DOA: 06/18/2017  PCP: [name], MD  Patient coming from: Home.  Chief Complaint: Altered mental status.  HPI: Colton Bush is a 61 y.o. male with history of diabetes mellitus type 2, hypertension, pancreatic cancer status post resection in April of this year at [facility] had a supple last evening and was doing his bills when patient was found to have sudden onset of confusion and altered mental status as witnessed by patient's wife.  EMS was called and patient was found to be hypoglycemic.  Patient was given D50 and brought to the ER.  ED Course: In the ER patient is found to be hypothermic and hypoglycemic.  Patient was started on D5W.  Will cultures were obtained.  Patient has chronic discharge from the surgical site for which patient was started on antibiotics since patient was hypothermic.  Patient gradually became well oriented.  At the time of my exam patient appears nonfocal.  Patient states that he last remembers doing his bills and then the next thing he can remember that he was brought to the ER.  Review of Systems: As per HPI, rest all negative.   Past Medical History:  Diagnosis Date  . Arthritis    left hand  . Bronchitis 1977  . Cancer ([place]) 03/09/2016   pancreatic cancer  . Depression    takes Cymbalta daily  . Diabetes mellitus type II    sees [name]   . GERD (gastroesophageal reflux disease)    takes Omeprazole daily  . H/O hiatal hernia   . Hyperlipidemia    takes Zocor daily  . Hypertension    takes Amlodipine daily  . Neck pain    C4-7 stenosis and herniated disc  . Neuromuscular disorder ([place])    hiatal hernia  . Scoliosis    slight  . Spinal cord injury, C5-C7 ([place])    c4-c7  . Stiffness of hand joint    d/t cervical issues    Past Surgical History:  Procedure Laterality Date  . ANTERIOR CERVICAL DECOMPRESSION/DISCECTOMY FUSION 3 LEVELS N/A 08/18/2011   Performed by Winfield Cunas, MD at [facility] NEURO ORS  . CARPAL TUNNEL RELEASE  2013   bilateral, per Dr. Christella Noa   . COLONOSCOPY  10-30-14   per Dr. Olevia Perches, clear, repeat in 10 yrs   . DIAGNOSTIC LAPAROSCOPY, LAPAROSCOPIC LIVER BIOPSY, RETROPERITONEAL EXPLORATION, INTRAOPERATIVE ULTRASOUND N/A 09/19/2016   Performed by Stark Klein, MD at [place] with esophageal dilation  9-08   per Dr. Olevia Perches  . ENDOSCOPIC RETROGRADE CHOLANGIOPANCREATOGRAPHY (ERCP) with brushings and stent N/A 03/01/2016   Performed by Doran Stabler, MD at [place]  . ESOPHAGEAL ENDOSCOPIC ULTRASOUND (EUS) RADIAL N/A 03/09/2016   Performed by Milus Banister, MD at [place]  . INSERTION PORT-A-CATH Left 03/22/2016   Performed by Stark Klein, MD at [facility] ORS  . lymph nodes biopsy    . melanoma rt calf  1999  . SPINE SURGERY    . TONSILLECTOMY     as a child  . ULNAR TUNNEL RELEASE  2013   right arm, per Dr. Christella Noa   . UPPER GASTROINTESTINAL ENDOSCOPY       reports that  has never smoked. he has never used smokeless tobacco. He reports that he does not drink alcohol or use drugs.  Allergies  Allergen Reactions  . No Known Allergies     Family  History  Problem Relation Age of Onset  . Heart disease Father   . Heart disease Brother 105  . Anesthesia problems Mother   . Heart disease Mother   . Dementia Mother   . Diabetes Sister   . Stroke Sister   . Colon cancer Neg Hx   . Rectal cancer Neg Hx   . Stomach cancer Neg Hx     Prior to Admission medications   Medication Sig Start Date End Date Taking? Authorizing Provider  amLODipine (NORVASC) 10 MG tablet TAKE 1 TABLET BY MOUTH EVERY DAY 01/30/17  Yes [name], MD  benazepril (LOTENSIN) 10 MG tablet Take 1 tablet (10 mg total) by mouth daily. 04/24/17  Yes [name], MD  calcium-vitamin D (OSCAL WITH D) 500-200 MG-UNIT tablet Take 1 tablet daily with breakfast by mouth.   Yes [provider]  CREON 36000 units CPEP capsule Take 2  capsules by mouth 3 (three) times daily before meals. Plus 1 capsule prior to snacks 01/13/17  Yes [provider]  ferrous sulfate 325 (65 FE) MG tablet Take 325 mg daily with breakfast by mouth.   Yes [provider]  FREESTYLE LITE test strip USE ONE STRIP TO CHECK GLUCOSE ONCE DAILY. PLEASE  SCHEDULE FOLLOW UP 11/09/16  Yes [name], MD  insulin aspart (NOVOLOG) 100 UNIT/ML injection Give every morning with breakfast and every evening with supper per the following sliding scale: give 3 units for glucose 150-175, give 6 units for glucose 176-200, give 9 units for glucose 201-250, give 12 units for glucose greater than 250 03/28/16  Yes [name], MD  Insulin Detemir (LEVEMIR) 100 UNIT/ML Pen Inject 15 Units into the skin 2 (two) times daily. Patient taking differently: Inject 40-60 Units 2 (two) times daily into the skin. 60 units am- 40 units pm 09/25/16  Yes Stark Klein, MD  Insulin Pen Needle 30G X 5 MM MISC Use one daily with insulin 03/04/16  Yes Copland, Gay Filler, MD  Lancets (FREESTYLE) lancets USE AS INSTRUCTED TO CHECK BLOOD SUGAR ONCE A DAY 06/02/16  Yes [name], MD  lidocaine-prilocaine (EMLA) cream Apply to affected area once 10/06/16  Yes Truitt Merle, MD  loratadine (CLARITIN) 10 MG tablet Take 10 mg by mouth daily.   Yes [provider]  magnesium oxide (MAG-OX) 400 (241.3 Mg) MG tablet TAKE 1 TABLET (400 MG TOTAL) BY MOUTH DAILY. 04/05/17  Yes Truitt Merle, MD  Multiple Vitamin (MULTIVITAMIN WITH MINERALS) TABS tablet Take 1 tablet by mouth daily.   Yes [provider]  naproxen sodium (ANAPROX) 220 MG tablet Take 440 mg by mouth 2 (two) times daily as needed (for pain.).   Yes [provider]  omeprazole (PRILOSEC) 20 MG capsule TAKE ONE CAPSULE BY MOUTH ONCE DAILY 08/28/16  Yes [name], MD  potassium chloride SA (KLOR-CON M20) 20 MEQ tablet Take 1 tablet (20 mEq total) by mouth daily. 04/27/17  Yes Truitt Merle, MD  JARDIANCE 25 MG TABS  tablet TAKE 1 TABLET BY MOUTH EVERY DAY Patient not taking: Reported on 06/18/2017 03/31/16   [name], MD  LORazepam (ATIVAN) 0.5 MG tablet Take 1 tablet (0.5 mg total) by mouth every 6 (six) hours as needed (Nausea or vomiting). Patient not taking: Reported on 06/18/2017 10/06/16   Truitt Merle, MD  NOVOLOG FLEXPEN 100 UNIT/ML FlexPen GIVE EVERY MORNING WITH BREAKFAST AND EVERY EVENING WITH SUPPER PER SLIDING SCALE Patient not taking: Reported on 06/18/2017 04/17/17  [name], MD  Omega-3 Fatty Acids (FISH OIL) 1200 MG CAPS Take 2,400 mg by mouth daily.    [provider]  ondansetron (ZOFRAN) 8 MG tablet Take 1 tablet (8 mg total) by mouth 2 (two) times daily as needed (Nausea or vomiting). Patient not taking: Reported on 06/18/2017 10/06/16   Truitt Merle, MD  oxyCODONE (OXY IR/ROXICODONE) 5 MG immediate release tablet Take 1-2 tablets (5-10 mg total) by mouth every 4 (four) hours as needed for moderate pain, severe pain or breakthrough pain. Patient not taking: Reported on 06/18/2017 09/25/16   Stark Klein, MD  prochlorperazine (COMPAZINE) 10 MG tablet Take 1 tablet (10 mg total) by mouth every 6 (six) hours as needed (Nausea or vomiting). Patient not taking: Reported on 06/18/2017 10/06/16   Truitt Merle, MD    Physical Exam: Vitals:   06/19/17 0015 06/19/17 0030 06/19/17 0045 06/19/17 0058  BP: (!) 151/87 131/80 106/71   Pulse: 97 73 69   Resp: 12 15 13    Temp:    (!) 97.4 F (36.3 C)  TempSrc:    Oral  SpO2: 100% 96% 94%   Weight:      Height:          Constitutional: Moderately built and nourished. Vitals:   06/19/17 0015 06/19/17 0030 06/19/17 0045 06/19/17 0058  BP: (!) 151/87 131/80 106/71   Pulse: 97 73 69   Resp: 12 15 13    Temp:    (!) 97.4 F (36.3 C)  TempSrc:    Oral  SpO2: 100% 96% 94%   Weight:      Height:       Eyes: Anicteric no pallor. ENMT: No discharge from the ears eyes nose or mouth. Neck: No mass felt.  No neck rigidity. Respiratory: No  rhonchi or crepitations. Cardiovascular: S1-S2 heard no murmurs appreciated. Abdomen: Soft nontender bowel sounds present Musculoskeletal: No edema but no joint effusion. Skin: Mild discharge from the surgical sites on the abdomen. Neurologic: Alert awake oriented to time place and person.  Moves all extremities. Psychiatric: Appears normal.  Normal affect.   Labs on Admission: I have personally reviewed following labs and imaging studies  CBC: Recent Labs  Lab 06/18/17 2226 06/18/17 2257  WBC 11.1*  --   NEUTROABS 9.6*  --   HGB 10.5* 11.9*  HCT 34.7* 35.0*  MCV 80.1  --   PLT 313  --    Basic Metabolic Panel: Recent Labs  Lab 06/18/17 2226 06/18/17 2257  NA 139 142  K 3.3* 3.3*  CL 107 107  CO2 23  --   GLUCOSE 48* 46*  BUN 10 12  CREATININE 0.53* 0.50*  CALCIUM 9.1  --    GFR: Estimated Creatinine Clearance: 108.9 mL/min (A) (by C-G formula based on SCr of 0.5 mg/dL (L)). Liver Function Tests: Recent Labs  Lab 06/18/17 2226  AST 26  ALT 31  ALKPHOS 114  BILITOT 0.2*  PROT 8.2*  ALBUMIN 4.1   No results for input(s): LIPASE, AMYLASE in the last 168 hours. No results for input(s): AMMONIA in the last 168 hours. Coagulation Profile: No results for input(s): INR, PROTIME in the last 168 hours. Cardiac Enzymes: No results for input(s): CKTOTAL, CKMB, CKMBINDEX, TROPONINI in the last 168 hours. BNP (last 3 results) No results for input(s): PROBNP in the last 8760 hours. HbA1C: No results for input(s): HGBA1C in the last 72 hours. CBG: Recent Labs  Lab 06/18/17 2229 06/18/17 2302 06/18/17 2340 06/19/17 0051  GLUCAP 54* 46* 117* 137*   Lipid Profile: No results for input(s): CHOL, HDL, LDLCALC, TRIG, CHOLHDL, LDLDIRECT in the last 72 hours. Thyroid Function Tests: No results for input(s): TSH, T4TOTAL, FREET4, T3FREE, THYROIDAB in the last 72 hours. Anemia Panel: No results for input(s): VITAMINB12, FOLATE, FERRITIN, TIBC, IRON, RETICCTPCT in the  last 72 hours. Urine analysis:    Component Value Date/Time   COLORURINE YELLOW 09/13/2016 1050   APPEARANCEUR CLEAR 09/13/2016 1050   LABSPEC 1.011 09/13/2016 1050   PHURINE 6.0 09/13/2016 1050   GLUCOSEU 150 (A) 09/13/2016 1050   HGBUR NEGATIVE 09/13/2016 [place] 09/13/2016 1050   BILIRUBINUR 1+ 02/23/2016 1256   KETONESUR NEGATIVE 09/13/2016 1050   PROTEINUR NEGATIVE 09/13/2016 1050   UROBILINOGEN 1.0 02/23/2016 1256   NITRITE NEGATIVE 09/13/2016 [place] 09/13/2016 1050   Sepsis Labs: @LABRCNTIP (procalcitonin:4,lacticidven:4) )No results found for this or any previous visit (from the past 240 hour(s)).   Radiological Exams on Admission: Dg Chest Port 1 View  Result Date: 06/19/2017 CLINICAL DATA:  61 year old male with hypoglycemia. EXAM: PORTABLE CHEST 1 VIEW COMPARISON:  Chest radiograph dated 09/19/2016 FINDINGS: Left pectoral Port-A-Cath with tip over central SVC close to the cavoatrial junction. Minimal bibasilar atelectatic changes. No focal consolidation, pleural effusion, or pneumothorax. The cardiac silhouette is within normal limits. Cervical fusion plate and screws partially visualized. No acute osseous pathology. IMPRESSION: No active disease. Electronically Signed   By: Anner Crete M.D.   On: 06/19/2017 02:12     Assessment/Plan Principal Problem:   Hypoglycemia Active Problems:   Essential hypertension   Diabetes mellitus type 2, uncontrolled, without complications (HCC)   Adenocarcinoma of head of pancreas (HCC)   Hypothermia    1. Hypoglycemia -patient takes Levemir 60 in the morning and 40 units in the evening.  Patient denies any recent change in his dosing.  He has not changed his diet.  Not sure exactly what is causing his hypoglycemia.  We will hold off Levemir and closely follow CBGs every L for now and keep patient on D5 normal.  If blood sugar remains consistently high then we will discontinue with D5  normal.  And observe. 2. Hypothermia likely from hypoglycemia.  However since patient has some discharge from the surgical site patient has been kept on antibiotics and follow blood cultures and also CT of the abdomen to make sure there is no obvious infection. 3. Hypertension on benazepril and amlodipine. 4. History of pancreatic cancer status post resection being followed by oncologist. 5. Anemia -on iron supplements follow CBC.   DVT prophylaxis: Lovenox. Code Status: Full code. Family Communication: Discussed with patient. Disposition Plan: Home. Consults called: None. Admission status: Observation.   Rise Patience MD [facility] Pager [phone].  If 7PM-7AM, please contact night-coverage [email or web address] Password [facility]  06/19/2017, 2:33 AM

## 2017-06-19 NOTE — Care Management Note (Signed)
Case Management Note  Patient Details  Name: MAYFIELD SCHOENE MRN: [id number] Date of Birth: 1955/10/26  Subjective/Objective:                  61 y.o. male with history significant for pancreatic cancer, diabetes,hypertension presenting via EMS after being found at home unresponsive and hypoglycemic. From home with spouse.  Action/Plan: Admit status home with self care (OBS); anticipate discharge home with self care.   Expected Discharge Date:  (unknown)               Expected Discharge Plan:  Home/Self Care  In-House Referral:  Financial Counselor  Discharge planning Services  CM Consult   Status of Service:  In process, will continue to follow   Fuller Mandril, RN 06/19/2017, 1:36 PM

## 2017-06-19 NOTE — ED Notes (Signed)
Pt cleaned after large soft bowel movement. Pt placed back on bedpan per pt request

## 2017-06-19 NOTE — ED Notes (Signed)
Patient transported to CT 

## 2017-06-19 NOTE — ED Notes (Signed)
Pt back from CT

## 2017-06-19 NOTE — ED Notes (Signed)
Pt's lunch tray arrived. 

## 2017-06-19 NOTE — ED Notes (Signed)
Admitting md at bedside

## 2017-06-20 DIAGNOSIS — E162 Hypoglycemia, unspecified: Secondary | ICD-10-CM | POA: Diagnosis not present

## 2017-06-20 LAB — GLUCOSE, CAPILLARY
Glucose-Capillary: 151 mg/dL — ABNORMAL HIGH (ref 65–99)
Glucose-Capillary: 189 mg/dL — ABNORMAL HIGH (ref 65–99)
Glucose-Capillary: 238 mg/dL — ABNORMAL HIGH (ref 65–99)

## 2017-06-20 MED ORDER — INSULIN DETEMIR 100 UNIT/ML FLEXPEN: 15.0000 [IU] | PEN_INJECTOR | Freq: Every day | SUBCUTANEOUS | 3 refills | Status: DC

## 2017-06-20 NOTE — Progress Notes (Signed)
[id number] Patient provided discharge instructions. Patient accompanied by spouse. Discharged home.

## 2017-06-20 NOTE — Discharge Summary (Signed)
Physician Discharge Summary  Colton Bush JEH:[id number] DOB: [birth year] DOA: 06/18/2017  PCP: [name], MD  Admit date: 06/18/2017 Discharge date: 06/20/2017  Time spent: 25 minutes  Recommendations for Outpatient Follow-up:  1. Patient's dose of Levemir has been cut back from 40 units twice daily to 15 units once at night and I recommend outpatient titration of the same as needed 2. Should get A1c as per endocrinologist  Discharge Diagnoses:  Principal Problem:   Hypoglycemia Active Problems:   Essential hypertension   Diabetes mellitus type 2, uncontrolled, without complications ([place])   Adenocarcinoma of head of pancreas ([place])   Hypothermia   Discharge Condition: Improved  Diet recommendation: Diabetic heart healthy  Filed Weights   06/18/17 2158  Weight: 90.7 kg (200 lb)    History of present illness:  61 year old male with pancreatic cancer stage II T2 N1 M0 diagnosed 02/2016 status post Whipple 11/17/2016 on gemcitabine has been having low blood sugars for the past week week and a half in the outpatient setting and was adjusted downward from Levemir 6 units twice daily to 40 units at night His sugar had been low for the past couple of days in the low 100s He has been eating a regular amount He has not been ill otherwise He came to the emergency room 11/20 with altered mental status and severe hypothermia with temperature in the 80s and was disoriented  For his hypoglycemia was treated with D5 and sugars are checked 1 hourly, his hypothermia seemed to resolve with control of his blood sugar and it was felt that he had stabilized over the course of 24 hours with sugars in the low 100 range to resume on much lower dose of his Levemir He was stabilized for discharge on 06/20/17 and was instructed to follow-up with his endocrinologist   Discharge Exam: Vitals:   06/19/17 2208 06/20/17 0527  BP: (!) 155/83 135/72  Pulse: 81 67  Resp: 17 18  Temp: 97.9 F (36.6  C) 97.7 F (36.5 C)  SpO2: 96% 99%   Alert pleasant oriented in good spirits no new issues S1-S2 no murmur rub or gallop Abdomen soft midline wound Nontender No rebound no guarding Neurologically intact  Discharge Instructions   Discharge Instructions    Diet - low sodium heart healthy   Complete by:  As directed    Discharge instructions   Complete by:  As directed    I recommend that you do not use more than 15 units of Levemir at night Would recommend that you follow closely with your endocrinologist for further instructions and I recommend that you take the sliding scale insulin as well I have not changed any of your other medications please check your sugars as you have been and make sure that you eat a normal full meal in the evening as you will be taking your insulin then to prevent any lows in the morning good luck and happy Thanksgiving   Increase activity slowly   Complete by:  As directed      Current Discharge Medication List    CONTINUE these medications which have CHANGED   Details  Insulin Detemir (LEVEMIR) 100 UNIT/ML Pen Inject 15 Units into the skin daily at 10 pm. Qty: 90 mL, Refills: 3      CONTINUE these medications which have NOT CHANGED   Details  amLODipine (NORVASC) 10 MG tablet TAKE 1 TABLET BY MOUTH EVERY DAY Qty: 90 tablet, Refills: 3    benazepril (LOTENSIN) 10  MG tablet Take 1 tablet (10 mg total) by mouth daily. Qty: 90 tablet, Refills: 1    calcium-vitamin D (OSCAL WITH D) 500-200 MG-UNIT tablet Take 1 tablet daily with breakfast by mouth.    CREON 36000 units CPEP capsule Take 2 capsules by mouth 3 (three) times daily before meals. Plus 1 capsule prior to snacks Refills: 3    ferrous sulfate 325 (65 FE) MG tablet Take 325 mg daily with breakfast by mouth.    FREESTYLE LITE test strip USE ONE STRIP TO CHECK GLUCOSE ONCE DAILY. PLEASE  SCHEDULE FOLLOW UP Qty: 50 each, Refills: 0    insulin aspart (NOVOLOG) 100 UNIT/ML injection Give  every morning with breakfast and every evening with supper per the following sliding scale: give 3 units for glucose 150-175, give 6 units for glucose 176-200, give 9 units for glucose 201-250, give 12 units for glucose greater than 250 Qty: 3 vial, Refills: 11    Insulin Pen Needle 30G X 5 MM MISC Use one daily with insulin Qty: 100 each, Refills: 2    Lancets (FREESTYLE) lancets USE AS INSTRUCTED TO CHECK BLOOD SUGAR ONCE A DAY Qty: 100 each, Refills: 2    lidocaine-prilocaine (EMLA) cream Apply to affected area once Qty: 30 g, Refills: 3   Associated Diagnoses: Adenocarcinoma of head of pancreas (HCC)    loratadine (CLARITIN) 10 MG tablet Take 10 mg by mouth daily.    magnesium oxide (MAG-OX) 400 (241.3 Mg) MG tablet TAKE 1 TABLET (400 MG TOTAL) BY MOUTH DAILY. Qty: 90 tablet, Refills: 1   Associated Diagnoses: Hypomagnesemia    Multiple Vitamin (MULTIVITAMIN WITH MINERALS) TABS tablet Take 1 tablet by mouth daily.    naproxen sodium (ANAPROX) 220 MG tablet Take 440 mg by mouth 2 (two) times daily as needed (for pain.).    omeprazole (PRILOSEC) 20 MG capsule TAKE ONE CAPSULE BY MOUTH ONCE DAILY Qty: 90 capsule, Refills: 3    potassium chloride SA (KLOR-CON M20) 20 MEQ tablet Take 1 tablet (20 mEq total) by mouth daily. Qty: 30 tablet, Refills: 1   Associated Diagnoses: Malignant neoplasm of head of pancreas (HCC)    JARDIANCE 25 MG TABS tablet TAKE 1 TABLET BY MOUTH EVERY DAY Qty: 90 tablet, Refills: 1    NOVOLOG FLEXPEN 100 UNIT/ML FlexPen GIVE EVERY MORNING WITH BREAKFAST AND EVERY EVENING WITH SUPPER PER SLIDING SCALE Qty: 3 mL, Refills: 0    Omega-3 Fatty Acids (FISH OIL) 1200 MG CAPS Take 2,400 mg by mouth daily.    ondansetron (ZOFRAN) 8 MG tablet Take 1 tablet (8 mg total) by mouth 2 (two) times daily as needed (Nausea or vomiting). Qty: 30 tablet, Refills: 1   Associated Diagnoses: Adenocarcinoma of head of pancreas (HCC)    oxyCODONE (OXY IR/ROXICODONE) 5 MG  immediate release tablet Take 1-2 tablets (5-10 mg total) by mouth every 4 (four) hours as needed for moderate pain, severe pain or breakthrough pain. Qty: 40 tablet, Refills: 0    prochlorperazine (COMPAZINE) 10 MG tablet Take 1 tablet (10 mg total) by mouth every 6 (six) hours as needed (Nausea or vomiting). Qty: 30 tablet, Refills: 1   Associated Diagnoses: Adenocarcinoma of head of pancreas ([place])      STOP taking these medications     LORazepam (ATIVAN) 0.5 MG tablet        Allergies  Allergen Reactions  . No Known Allergies       The results of significant diagnostics from this hospitalization (including imaging, microbiology,  ancillary and laboratory) are listed below for reference.    Significant Diagnostic Studies: Ct Abdomen Pelvis W Contrast  Result Date: 06/19/2017 CLINICAL DATA:  Difficulty with blood sugar. EXAM: CT ABDOMEN AND PELVIS WITH CONTRAST TECHNIQUE: Multidetector CT imaging of the abdomen and pelvis was performed using the standard protocol following bolus administration of intravenous contrast. CONTRAST:  143mL ISOVUE-300 IOPAMIDOL (ISOVUE-300) INJECTION 61% COMPARISON:  PET-CT 05/18/2017.  CT abdomen and pelvis 09/04/2016 FINDINGS: Lower chest: Lung bases are clear. Hepatobiliary: Surgical absence of the gallbladder. No bile duct dilatation. No focal liver lesions identified. Stent in the common bile duct. Pancreas: Partial pancreatectomy with resection of the head of the pancreas. No pancreatic ductal dilatation or peripancreatic fluid. Spleen: Normal in size without focal abnormality. Adrenals/Urinary Tract: Adrenal glands are unremarkable. Kidneys are normal, without renal calculi, focal lesion, or hydronephrosis. Bladder is distended. This may be physiologic or due to urinary retention. Stomach/Bowel: Stomach, small bowel, and colon are not abnormally distended. Diffusely stool-filled colon. No wall thickening or inflammatory changes. Appendix is not  visualized. Vascular/Lymphatic: Aortic atherosclerosis. No enlarged abdominal or pelvic lymph nodes. Reproductive: Prostate gland is enlarged, measuring 5.6 cm diameter. Other: No free air or free fluid in the abdomen. Scarring along the anterior abdominal wall with mild fat herniation. Musculoskeletal: Degenerative changes in the spine. No destructive bone lesions. IMPRESSION: 1. Postoperative changes with surgical absence of the gallbladder, partial pancreatectomy, and stent in the common bile duct. No bile duct dilatation. 2. No evidence of bowel obstruction or inflammation. 3. Prostate gland is enlarged. 4. Aortic atherosclerosis. 5. Postoperative scarring in the anterior abdominal wall with mild focal fat herniation. 6. Bladder distention may be physiologic or may indicate urinary retention. Electronically Signed   By: Lucienne Capers M.D.   On: 06/19/2017 03:06   Dg Chest Port 1 View  Result Date: 06/19/2017 CLINICAL DATA:  61 year old male with hypoglycemia. EXAM: PORTABLE CHEST 1 VIEW COMPARISON:  Chest radiograph dated 09/19/2016 FINDINGS: Left pectoral Port-A-Cath with tip over central SVC close to the cavoatrial junction. Minimal bibasilar atelectatic changes. No focal consolidation, pleural effusion, or pneumothorax. The cardiac silhouette is within normal limits. Cervical fusion plate and screws partially visualized. No acute osseous pathology. IMPRESSION: No active disease. Electronically Signed   By: Anner Crete M.D.   On: 06/19/2017 02:12    Microbiology: Recent Results (from the past 240 hour(s))  Culture, blood (routine x 2)     Status: None (Preliminary result)   Collection Time: 06/18/17 10:35 PM  Result Value Ref Range Status   Specimen Description BLOOD LEFT ANTECUBITAL  Final   Special Requests   Final    BOTTLES DRAWN AEROBIC AND ANAEROBIC Blood Culture adequate volume   Culture NO GROWTH < 12 HOURS  Final   Report Status PENDING  Incomplete  Culture, blood (routine x  2)     Status: None (Preliminary result)   Collection Time: 06/18/17 11:00 PM  Result Value Ref Range Status   Specimen Description BLOOD RIGHT ANTECUBITAL  Final   Special Requests   Final    BOTTLES DRAWN AEROBIC AND ANAEROBIC Blood Culture adequate volume   Culture  Setup Time   Final    GRAM POSITIVE COCCI IN CLUSTERS IN BOTH AEROBIC AND ANAEROBIC BOTTLES CRITICAL RESULT CALLED TO, READ BACK BY AND VERIFIED WITH: PHARMD L CURRAN [id number] MLM    Culture GRAM POSITIVE COCCI  Final   Report Status PENDING  Incomplete  Blood Culture ID Panel (Reflexed)  Status: Abnormal   Collection Time: 06/18/17 11:00 PM  Result Value Ref Range Status   Enterococcus species NOT DETECTED NOT DETECTED Final   Listeria monocytogenes NOT DETECTED NOT DETECTED Final   Staphylococcus species DETECTED (A) NOT DETECTED Final    Comment: Methicillin (oxacillin) susceptible coagulase negative staphylococcus. Possible blood culture contaminant (unless isolated from more than one blood culture draw or clinical case suggests pathogenicity). No antibiotic treatment is indicated for blood  culture contaminants. CRITICAL RESULT CALLED TO, READ BACK BY AND VERIFIED WITH: PHARMD L CURRAN [id number] MLM    Staphylococcus aureus NOT DETECTED NOT DETECTED Final   Methicillin resistance NOT DETECTED NOT DETECTED Final   Streptococcus species NOT DETECTED NOT DETECTED Final   Streptococcus agalactiae NOT DETECTED NOT DETECTED Final   Streptococcus pneumoniae NOT DETECTED NOT DETECTED Final   Streptococcus pyogenes NOT DETECTED NOT DETECTED Final   Acinetobacter baumannii NOT DETECTED NOT DETECTED Final   Enterobacteriaceae species NOT DETECTED NOT DETECTED Final   Enterobacter cloacae complex NOT DETECTED NOT DETECTED Final   Escherichia coli NOT DETECTED NOT DETECTED Final   Klebsiella oxytoca NOT DETECTED NOT DETECTED Final   Klebsiella pneumoniae NOT DETECTED NOT DETECTED Final   Proteus species NOT DETECTED  NOT DETECTED Final   Serratia marcescens NOT DETECTED NOT DETECTED Final   Haemophilus influenzae NOT DETECTED NOT DETECTED Final   Neisseria meningitidis NOT DETECTED NOT DETECTED Final   Pseudomonas aeruginosa NOT DETECTED NOT DETECTED Final   Candida albicans NOT DETECTED NOT DETECTED Final   Candida glabrata NOT DETECTED NOT DETECTED Final   Candida krusei NOT DETECTED NOT DETECTED Final   Candida parapsilosis NOT DETECTED NOT DETECTED Final   Candida tropicalis NOT DETECTED NOT DETECTED Final     Labs: Basic Metabolic Panel: Recent Labs  Lab 06/18/17 2226 06/18/17 2257 06/19/17 0341  NA 139 142 137  K 3.3* 3.3* 3.8  CL 107 107 106  CO2 23  --  23  GLUCOSE 48* 46* 186*  BUN 10 12 10   CREATININE 0.53* 0.50* 0.58*  CALCIUM 9.1  --  8.9   Liver Function Tests: Recent Labs  Lab 06/18/17 2226  AST 26  ALT 31  ALKPHOS 114  BILITOT 0.2*  PROT 8.2*  ALBUMIN 4.1   No results for input(s): LIPASE, AMYLASE in the last 168 hours. No results for input(s): AMMONIA in the last 168 hours. CBC: Recent Labs  Lab 06/18/17 2226 06/18/17 2257 06/19/17 0341  WBC 11.1*  --  6.3  NEUTROABS 9.6*  --   --   HGB 10.5* 11.9* 9.4*  HCT 34.7* 35.0* 30.2*  MCV 80.1  --  79.3  PLT 313  --  332   Cardiac Enzymes: No results for input(s): CKTOTAL, CKMB, CKMBINDEX, TROPONINI in the last 168 hours. BNP: BNP (last 3 results) No results for input(s): BNP in the last 8760 hours.  ProBNP (last 3 results) No results for input(s): PROBNP in the last 8760 hours.  CBG: Recent Labs  Lab 06/19/17 1013 06/19/17 1233 06/19/17 1713 06/19/17 2204 06/20/17 0754  GLUCAP 171* 142* 168* 143* 151*       Signed:  Nita Sells MD   [facility] 06/20/2017, 8:37 AM

## 2017-06-21 LAB — CULTURE, BLOOD (ROUTINE X 2): Special Requests: ADEQUATE

## 2017-06-23 LAB — CULTURE, BLOOD (ROUTINE X 2)
Culture: NO GROWTH
Special Requests: ADEQUATE

## 2017-07-02 ENCOUNTER — Other Ambulatory Visit: Payer: Self-pay | Admitting: Hematology

## 2017-07-02 DIAGNOSIS — C250 Malignant neoplasm of head of pancreas: Secondary | ICD-10-CM

## 2017-07-20 ENCOUNTER — Ambulatory Visit (HOSPITAL_BASED_OUTPATIENT_CLINIC_OR_DEPARTMENT_OTHER): Payer: 59

## 2017-07-20 DIAGNOSIS — Z452 Encounter for adjustment and management of vascular access device: Secondary | ICD-10-CM | POA: Diagnosis not present

## 2017-07-20 DIAGNOSIS — Z95828 Presence of other vascular implants and grafts: Secondary | ICD-10-CM

## 2017-07-20 DIAGNOSIS — C250 Malignant neoplasm of head of pancreas: Secondary | ICD-10-CM

## 2017-07-20 MED ORDER — SODIUM CHLORIDE 0.9 % IJ SOLN
10.0000 mL | INTRAMUSCULAR | Status: DC | PRN
  Administered 2017-07-20: 10 mL via INTRAVENOUS
  Filled 2017-07-20: qty 10

## 2017-07-20 MED ORDER — HEPARIN SOD (PORK) LOCK FLUSH 100 UNIT/ML IV SOLN
500.0000 [IU] | Freq: Once | INTRAVENOUS | Status: AC | PRN
  Administered 2017-07-20: 500 [IU] via INTRAVENOUS
  Filled 2017-07-20: qty 5

## 2017-08-18 ENCOUNTER — Other Ambulatory Visit: Payer: Self-pay | Admitting: Family Medicine

## 2017-08-31 ENCOUNTER — Other Ambulatory Visit: Payer: Self-pay | Admitting: Hematology

## 2017-08-31 DIAGNOSIS — C250 Malignant neoplasm of head of pancreas: Secondary | ICD-10-CM

## 2017-09-22 ENCOUNTER — Other Ambulatory Visit: Payer: Self-pay | Admitting: Family Medicine

## 2017-09-25 ENCOUNTER — Other Ambulatory Visit: Payer: Self-pay | Admitting: Family Medicine

## 2017-09-25 NOTE — Telephone Encounter (Signed)
Copied from [place]. Topic: Quick Communication - Rx Refill/Question >> Sep 25, 2017  9:01 AM Robina Ade, Helene Kelp D wrote: Medication: omeprazole (PRILOSEC) 20 MG capsule   Has the patient contacted their pharmacy? Yes   (Agent: If no, request that the patient contact the pharmacy for the refill.)   Preferred Pharmacy (with phone number or street name): [id number] - [place], [place]. AT [address]   Agent: Please be advised that RX refills may take up to 3 business days. We ask that you follow-up with your pharmacy.

## 2017-09-26 MED ORDER — OMEPRAZOLE 20 MG PO CPDR: 20.0000 mg | DELAYED_RELEASE_CAPSULE | Freq: Every day | ORAL | 3 refills | Status: DC

## 2017-09-26 NOTE — Telephone Encounter (Signed)
LOV 03/10/16 Dr. Sar[facility]

## 2017-09-26 NOTE — Telephone Encounter (Signed)
Please advise. Last OV >1 year ago and pt has no upcoming appts.

## 2017-09-27 ENCOUNTER — Encounter: Payer: Self-pay | Admitting: Family Medicine

## 2017-09-27 ENCOUNTER — Ambulatory Visit: Payer: 59 | Admitting: Family Medicine

## 2017-09-27 ENCOUNTER — Other Ambulatory Visit: Payer: Self-pay | Admitting: Hematology

## 2017-09-27 VITALS — BP 110/72 | HR 94 | Temp 98.3°F | Wt 196.0 lb

## 2017-09-27 DIAGNOSIS — K219 Gastro-esophageal reflux disease without esophagitis: Secondary | ICD-10-CM | POA: Diagnosis not present

## 2017-09-27 DIAGNOSIS — E1165 Type 2 diabetes mellitus with hyperglycemia: Secondary | ICD-10-CM | POA: Diagnosis not present

## 2017-09-27 DIAGNOSIS — C250 Malignant neoplasm of head of pancreas: Secondary | ICD-10-CM | POA: Diagnosis not present

## 2017-09-27 DIAGNOSIS — I10 Essential (primary) hypertension: Secondary | ICD-10-CM

## 2017-09-27 DIAGNOSIS — E782 Mixed hyperlipidemia: Secondary | ICD-10-CM

## 2017-09-27 DIAGNOSIS — IMO0001 Reserved for inherently not codable concepts without codable children: Secondary | ICD-10-CM

## 2017-09-27 MED ORDER — OMEPRAZOLE 20 MG PO CPDR
20.0000 mg | DELAYED_RELEASE_CAPSULE | Freq: Every day | ORAL | 3 refills | Status: DC
Start: 2017-09-27 — End: 2018-09-13

## 2017-09-27 MED ORDER — BENAZEPRIL HCL 10 MG PO TABS: 10.0000 mg | ORAL_TABLET | Freq: Every day | ORAL | 3 refills | Status: DC

## 2017-09-27 MED ORDER — POTASSIUM CHLORIDE CRYS ER 20 MEQ PO TBCR: 20.0000 meq | EXTENDED_RELEASE_TABLET | Freq: Every day | ORAL | 3 refills | Status: DC

## 2017-09-27 NOTE — Progress Notes (Signed)
   Subjective:    Patient ID: Colton Bush, male    DOB: 1956-04-09, 62 y.o.   MRN: [id number]  HPI Here to follow up. He is doing amazingly well. He had adenocarcinoma of the head of the pancreas, and he is seeing Dr. Cristino Martes at [facility]. He had several rounds of chemotherapy and radiation therapy, and then on 11-17-16 he had a Whipple procedure. The margins were all negative and all lymph nodes harvested were negative. He has gotten stronger and his appetite is back to normal. He has gained some weight. His BP is stable. He is seeing Dr. Chalmers Cater for the diabetes and his glucoses have been well controlled. He is currently on [employer] and disability, but he is thinking about doing some part time work.    Review of Systems  Constitutional: Negative.   Respiratory: Negative.   Cardiovascular: Negative.   Gastrointestinal: Negative.   Endocrine: Negative.   Genitourinary: Negative.   Neurological: Negative.        Objective:   Physical Exam  Constitutional: He is oriented to person, place, and time. He appears well-developed and well-nourished.  Neck: No thyromegaly present.  Cardiovascular: Normal rate, regular rhythm, normal heart sounds and intact distal pulses.  Pulmonary/Chest: Effort normal and breath sounds normal. No respiratory distress. He has no wheezes. He has no rales.  Abdominal: Soft. Bowel sounds are normal. He exhibits no distension and no mass. There is no tenderness. There is no rebound and no guarding.  Musculoskeletal: He exhibits no edema.  Lymphadenopathy:    He has no cervical adenopathy.  Neurological: He is alert and oriented to person, place, and time.          Assessment & Plan:  He seems to be doing well after pancreatic cancer treatments. His BP is stable, and his diabetes is well controlled. Medications were refilled.  Alysia Penna, MD

## 2017-10-02 ENCOUNTER — Telehealth: Payer: Self-pay | Admitting: *Deleted

## 2017-10-02 NOTE — Telephone Encounter (Signed)
Received refill request for Magnesium from pt's pharmacy. Spoke with Dr. [name] about need to refill or not. Per Dr. [name], as long as patient is eating well and not having diarrhea, it is ok to stop.   TCT patient and spoke with him. He states he is feeling well, eating and is not having diarrhea.  Informed him that he does not need to take the magnesium at this time and that it would not be refilled.  Pt voiced understanding.  He is aware of his appt to see Dr. [name] in April.  No other questions or concerns.

## 2017-10-03 ENCOUNTER — Telehealth: Payer: Self-pay

## 2017-10-03 NOTE — Telephone Encounter (Signed)
PA initiated via [facility] at [phone].  PA approved through 10/04/2018.

## 2017-10-03 NOTE — Telephone Encounter (Signed)
PA needed for omeprazol DR cap 20 mg Rx   Placed on Jo Ann's desk   [facility]

## 2017-10-31 LAB — HM DIABETES EYE EXAM

## 2017-11-02 NOTE — Telephone Encounter (Signed)
No further information needed. Patient was approved.

## 2017-11-06 NOTE — Progress Notes (Signed)
Colton Bush  [phone] [phone]  Clinic Follow up Note   Patient Care Team: Colton Morale, MD as PCP - General 11/07/2017  CHIEF COMPLAINTS:  Follow up pancreatic cancer  Oncology History   Cancer Staging Adenocarcinoma of head of pancreas [facility]) Staging form: Pancreas, AJCC 7th Edition - Clinical stage from 03/09/2016: Stage IIB (T2, N1, M0) - Signed by Colton Merle, MD on 03/17/2016 - Pathologic stage from 11/17/2016: Stage IB (yT2, N0, cM0) - Signed by Colton Merle, MD on 12/10/2016       Adenocarcinoma of head of pancreas (Colton Bush)   02/27/2016 Imaging    CT chest, abdomen and pelvis with contrast showed ill-defined heterogeneity of pancreatic head, highly suspicious for malignancy, ) quit about and biliary ductal dilatation, mildly prominent lymph nodes in the upper abdomen, largest 2 cm in the portocaval . No other metastasis.       03/09/2016 Initial Diagnosis    Adenocarcinoma of head of pancreas (Colton Bush)      03/09/2016 Procedure    Upper EUS showed a 3.5 cm irregular mass in the pancreatic head, causing pancreatic and biliary duct obstruction. The mass involves the portal vein 422 mm, strongly suggesting invasion. There is suspicious nearby adenopathy      03/09/2016 Initial Biopsy    Fine-needle aspiration of the pancreatic mass from EUS showed malignant cells consistent with adenocarcinoma.       03/28/2016 - 05/24/2016 Chemotherapy    neoadjuvant chemo with FOLFIRINOX every 2 weeks, for 5 cycles       06/28/2016 - 08/10/2016 Radiation Therapy    Neoadjuvant radiation by Dr. Lisbeth Bush Site/dose:   Pancreas treated to 45 Gy in 25 fractions. The Pancreas was then boosted to 54 Gy in 5 fractions.      06/28/2016 - 08/10/2016 Chemotherapy    Xeloda 2000 mg in the morning and 1500 mg in the evening, on the day of radiation.      09/04/2016 Imaging    CT Chest Abdomen Pelvis IMPRESSION: 1. Poorly defined pancreatic head mass is grossly stable,  with associated pancreatic ductal dilatation and portacaval adenopathy. No associated vascular encasement. 2. Common bile duct stent in place with stable biliary ductal dilatation. Left hepatic lobe atrophy. 3. Hepatomegaly.  Spleen is at the upper limits normal in size. 4. Aortic atherosclerosis (ICD10-170.0). Coronary artery calcification.      09/19/2016 Surgery    Diagnostic laparoscopy and possible whipple procedure by Dr. Barry Bush. Whipple procedure aborted due to the pancreatic cancer involving SMV, SMA and portal veins.      09/19/2016 Pathology Results    Two liver lesions were noted during diagnostic laparoscopy and biopsy revealed benign hepatic parenchyma with assoicated fibrosis with no evidence of malignancy.      10/06/2016 - 10/20/2016 Chemotherapy    Gemcitabine and Abraxane weekly, on day 1, 8, and 15 every 28 days, starting on 10/06/2016, held after1 cycle due to pending surgery at [facility]      10/31/2016 Imaging    CT CAP w contrast at [facility] 10/31/16 Impression: 1.Interval decrease in the size of the ill-defined pancreatic head mass. There is short segment abutment of the portal vein/superior mesenteric vein but no evidence of venous distortion. 2.No evidence of metastatic disease in the chest, abdomen and pelvis.      11/17/2016 -  Hospital Admission    Patient presents to hospital for nausea and vomitting      11/17/2016 Surgery    pancreatecomy, promximal subtotal with total  duodenectomy, partial  GASTRECTOMY, CHOLEDOCHOENTEROSTOMY AND GASTROJEJUNOSTOMY (WHIPPLE-TYPE PROCEDURE); WITH PANCREATOJEJUNOSTOMY      01/19/2017 - 03/02/2017 Chemotherapy    Gemcitabine monotherapy: 1000mg /m2, d1,8 Q21d x2 cycles planned       05/18/2017 Imaging    CT CAP at [facility] 05/18/17 Impression: 1.Status post Whipple with soft tissue density just posterior to the SMA, which may be postsurgical, lymph node, or less likely recurrent disease. Recommend attention on follow-up 2.No  evidence of metastatic disease in the chest, abdomen, and pelvis.        09/20/2017 Imaging    CT CAP at [facility] 09/20/17 Impression: 1. Unchanged subcentimeter soft tissue nodule in the Whipple surgical bed, likely a small lymph node. 2. No evidence of metastatic disease in the chest, abdomen, or pelvis.       HISTORY OF PRESENTING ILLNESS:  Colton Bush 62 y.o. male is here because of His newly diagnosed pancreatic cancer. He is accompanied by his wife to our multidisciplinary GI clinic today.  He noticed fatigue, anorexia and weight loss about 50bs in the past 4 months. It started when his endocrinologist changed his diabetic meds. He presented to hospital on 7/30 with jaundice. No pain or nausea. CT scan revealed a heterogeneous pancreatic head mass, highly suspicious for malignancy. Mild biliary and pancreatic duct dilatation, mildly prominent lymph nodes in the upper abdomen. He underwent ERCP and CBD stent placement, and subsequent upper EUS on 03/09/2016, which showed a 3.5 cm irregular mass in the pancreatic head, the mass involves the portal vein, no suspicious nearby adenopathy.  He has been feeling better after stent placement, with improved appetite and energy level. He feels normal again. No symptoms. BM is good, he has gained about 8lbs back.   CURRENT THERAPY: Surveillance   INTERIM HISTORY:   Colton Bush returns for follow up of his pancreatic cancer. He presents to the clinic today by himself. He reports he is doing well overall. He has good energy and is ready to start doing yard work outside. He endorses a good appetite and regular bowel movements. He states his incision has fully healed. He next appointment is in July at [facility]. He is retired.   On review of systems, pt denies pain, or any other complaints at this time. Pertinent positives are listed and detailed within the above HPI.    MEDICAL HISTORY:  Past Medical History:  Diagnosis Date  . Arthritis    left  hand  . Bronchitis 1977  . Cancer ([place]) 03/09/2016   pancreatic cancer, sees Dr. Cristino Martes at [facility]   . Depression    takes Cymbalta daily  . Diabetes mellitus type II    sees Dr. Chalmers Cater   . GERD (gastroesophageal reflux disease)    takes Omeprazole daily  . H/O hiatal hernia   . Hyperlipidemia    takes Zocor daily  . Hypertension    takes Amlodipine daily  . Hypoglycemia 06/18/2017  . Neck pain    C4-7 stenosis and herniated disc  . Neuromuscular disorder ([place])    hiatal hernia  . Scoliosis    slight  . Spinal cord injury, C5-C7 ([place])    c4-c7  . Stiffness of hand joint    d/t cervical issues    SURGICAL HISTORY: Past Surgical History:  Procedure Laterality Date  . ANTERIOR CERVICAL DECOMP/DISCECTOMY FUSION  08/18/2011   Procedure: ANTERIOR CERVICAL DECOMPRESSION/DISCECTOMY FUSION 3 LEVELS;  Surgeon: Winfield Cunas, MD;  Location: [facility];  Service: Neurosurgery;  Laterality:  N/A;  Anterior Cervical Four-Five/Five-Six/Six-Seven Decompression with Fusion, Plating, and Bonegraft  . CARPAL TUNNEL RELEASE  2013   bilateral, per Dr. Christella Noa   . COLONOSCOPY  10-30-14   per Dr. Olevia Perches, clear, repeat in 10 yrs   . egd with esophageal dilation  9-08   per Dr. Olevia Perches  . ERCP N/A 03/01/2016   Procedure: ENDOSCOPIC RETROGRADE CHOLANGIOPANCREATOGRAPHY (ERCP) with brushings and stent;  Surgeon: Doran Stabler, MD;  Location: [facility];  Service: Endoscopy;  Laterality: N/A;  . EUS N/A 03/09/2016   Procedure: ESOPHAGEAL ENDOSCOPIC ULTRASOUND (EUS) RADIAL;  Surgeon: Milus Banister, MD;  Location: [facility];  Service: Endoscopy;  Laterality: N/A;  . lymph nodes biopsy    . melanoma rt calf  1999  . PORTACATH PLACEMENT Left 03/22/2016   Procedure: INSERTION PORT-A-CATH;  Surgeon: Stark Klein, MD;  Location: [facility];  Service: General;  Laterality: Left;  . SPINE SURGERY    . TONSILLECTOMY     as a child  . ULNAR TUNNEL RELEASE  2013   right arm, per Dr. Christella Noa   . UPPER  GASTROINTESTINAL ENDOSCOPY    . WHIPPLE PROCEDURE N/A 09/19/2016   Procedure: DIAGNOSTIC LAPAROSCOPY, LAPAROSCOPIC LIVER BIOPSY, RETROPERITONEAL EXPLORATION, INTRAOPERATIVE ULTRASOUND;  Surgeon: Stark Klein, MD;  Location: [facility];  Service: General;  Laterality: N/A;    SOCIAL HISTORY: Social History   Socioeconomic History  . Marital status: Married    Spouse name: [name]  . Number of children: Not on file  . Years of education: Not on file  . Highest education level: Not on file  Occupational History  . Occupation: Diplomatic Services operational officer  . Financial resource strain: Not on file  . Food insecurity:    Worry: Not on file    Inability: Not on file  . Transportation needs:    Medical: Not on file    Non-medical: Not on file  Tobacco Use  . Smoking status: Never Smoker  . Smokeless tobacco: Never Used  . Tobacco comment: tried a pipe 35 years ago   Substance and Sexual Activity  . Alcohol use: No    Alcohol/week: 0.0 oz  . Drug use: No  . Sexual activity: Yes  Lifestyle  . Physical activity:    Days per week: Not on file    Minutes per session: Not on file  . Stress: Not on file  Relationships  . Social connections:    Talks on phone: Not on file    Gets together: Not on file    Attends religious service: Not on file    Active member of club or organization: Not on file    Attends meetings of clubs or organizations: Not on file    Relationship status: Not on file  . Intimate partner violence:    Fear of current or ex partner: Not on file    Emotionally abused: Not on file    Physically abused: Not on file    Forced sexual activity: Not on file  Other Topics Concern  . Not on file  Social History Narrative   Married, wife [name]   Armored Nutritional therapist-   He used to work as a Geophysicist/field seismologist for a company  He has one son 26 yo. Lives with his wife   FAMILY HISTORY: Family History  Problem Relation Age of Onset  . Heart disease Father   . Heart disease Brother 43  .  Anesthesia problems Mother   . Heart disease Mother   .  Dementia Mother   . Diabetes Sister   . Stroke Sister   . Colon cancer Neg Hx   . Rectal cancer Neg Hx   . Stomach cancer Neg Hx     ALLERGIES:  is allergic to no known allergies.  MEDICATIONS:  Current Outpatient Medications  Medication Sig Dispense Refill  . amLODipine (NORVASC) 10 MG tablet TAKE 1 TABLET BY MOUTH EVERY DAY 90 tablet 3  . benazepril (LOTENSIN) 10 MG tablet Take 1 tablet (10 mg total) by mouth daily. 90 tablet 3  . calcium-vitamin D (OSCAL WITH D) 500-200 MG-UNIT tablet Take 1 tablet daily with breakfast by mouth.    . CREON 36000 units CPEP capsule Take 2 capsules by mouth 3 (three) times daily before meals. Plus 1 capsule prior to snacks  3  . ferrous sulfate 325 (65 FE) MG tablet Take 325 mg daily with breakfast by mouth.    Marland Kitchen FREESTYLE LITE test strip USE ONE STRIP TO CHECK GLUCOSE ONCE DAILY. PLEASE  SCHEDULE FOLLOW UP 50 each 0  . insulin aspart (NOVOLOG) 100 UNIT/ML injection Give every morning with breakfast and every evening with supper per the following sliding scale: give 3 units for glucose 150-175, give 6 units for glucose 176-200, give 9 units for glucose 201-250, give 12 units for glucose greater than 250 3 vial 11  . Insulin Detemir (LEVEMIR) 100 UNIT/ML Pen Inject 15 Units into the skin daily at 10 pm. 90 mL 3  . Insulin Pen Needle 30G X 5 MM MISC Use one daily with insulin 100 each 2  . Lancets (FREESTYLE) lancets USE AS INSTRUCTED TO CHECK BLOOD SUGAR ONCE A DAY 100 each 2  . lidocaine-prilocaine (EMLA) cream Apply to affected area once 30 g 3  . loratadine (CLARITIN) 10 MG tablet Take 10 mg by mouth daily.    . Multiple Vitamin (MULTIVITAMIN WITH MINERALS) TABS tablet Take 1 tablet by mouth daily.    . naproxen sodium (ANAPROX) 220 MG tablet Take 440 mg by mouth 2 (two) times daily as needed (for pain.).    Marland Kitchen NOVOLOG FLEXPEN 100 UNIT/ML FlexPen GIVE EVERY MORNING WITH BREAKFAST AND EVERY EVENING  WITH SUPPER PER SLIDING SCALE 3 mL 0  . Omega-3 Fatty Acids (FISH OIL) 1200 MG CAPS Take 2,400 mg by mouth daily.    Marland Kitchen omeprazole (PRILOSEC) 20 MG capsule Take 1 capsule (20 mg total) by mouth daily. 90 capsule 3  . potassium chloride SA (KLOR-CON M20) 20 MEQ tablet Take 1 tablet (20 mEq total) by mouth daily. 90 tablet 3   No current facility-administered medications for this visit.    Facility-Administered Medications Ordered in Other Visits  Medication Dose Route Frequency Provider Last Rate Last Dose  . sodium chloride 0.9 % injection 10 mL  10 mL Intravenous PRN Colton Merle, MD   10 mL at 02/09/17 1111    REVIEW OF SYSTEMS:  Constitutional: Denies fevers, chills or abnormal night sweats (+) good appetite and energy Eyes: Denies blurriness of vision, double vision or watery eyes Ears, nose, mouth, throat, and face: Denies mucositis or sore throat Respiratory: Denies cough, dyspnea or wheezes Cardiovascular: Denies palpitation, chest discomfort or lower extremity swelling Gastrointestinal:  Denies nausea, heartburn or change in bowel habits Skin: Denies abnormal skin rashes Lymphatics: Denies new lymphadenopathy or easy bruising Neurological:Denies numbness, tingling or new weaknesses Behavioral/Psych: Mood is stable, no new changes  All other systems were reviewed with the patient and are negative.  PHYSICAL EXAMINATION: ECOG PERFORMANCE STATUS:  1  Vitals:   11/07/17 1007  BP: 112/67  Pulse: 72  Resp: 18  Temp: 98 F (36.7 C)  SpO2: 99%   Filed Weights   11/07/17 1007  Weight: 196 lb 4.8 oz (89 kg)    GENERAL:alert, no distress and comfortable.  SKIN: skin color, texture, turgor are normal, no rashes or significant lesions EYES: normal, conjunctiva are pink and non-injected, sclera clear OROPHARYNX:no exudate, no erythema and lips, buccal mucosa, and tongue normal  NECK: supple, thyroid normal size, non-tender, without nodularity LYMPH:  no palpable lymphadenopathy in  the cervical, axillary or inguinal LUNGS: clear to auscultation and percussion with normal breathing effort HEART: regular rate & rhythm and no murmurs and no lower extremity edema ABDOMEN: abdomen soft, non-tender and normal bowel sounds, the midline surgical scar has healed with some scar tissue, no discharge   Musculoskeletal:no cyanosis of digits and no clubbing  PSYCH: alert & oriented x 3 with fluent speech NEURO: no focal motor/sensory deficits  LABORATORY DATA:  I have reviewed the data as listed CBC Latest Ref Rng & Units 11/07/2017 06/19/2017 06/18/2017  WBC 4.0 - 10.3 K/uL 10.2 6.3 -  Hemoglobin 13.0 - 17.1 g/dL 10.9(L) 9.4(L) 11.9(L)  Hematocrit 38.4 - 49.9 % 34.2(L) 30.2(L) 35.0(L)  Platelets 140 - 400 K/uL 319 332 -   CMP Latest Ref Rng & Units 11/07/2017 06/19/2017 06/18/2017  Glucose 70 - 140 mg/dL 159(H) 186(H) 46(L)  BUN 7 - 26 mg/dL 19 10 12   Creatinine 0.70 - 1.30 mg/dL 0.77 0.58(L) 0.50(L)  Sodium 136 - 145 mmol/L 140 137 142  Potassium 3.5 - 5.1 mmol/L 3.9 3.8 3.3(L)  Chloride 98 - 109 mmol/L 105 106 107  CO2 22 - 29 mmol/L 26 23 -  Calcium 8.4 - 10.4 mg/dL 9.5 8.9 -  Total Protein 6.4 - 8.3 g/dL 7.8 - -  Total Bilirubin 0.2 - 1.2 mg/dL 0.5 - -  Alkaline Phos 40 - 150 U/L 137 - -  AST 5 - 34 U/L 21 - -  ALT 0 - 55 U/L 29 - -    CA19.9 (0-35 U/ML)  02/28/2016: 1497 03/24/2016: 648 04/25/2016: 599 05/24/2016: 368 07/18/2016: 51 08/28/16: 55 10/06/16: 133 12/08/2016: 36 01/19/17: 36 02/07/17: 21 03/02/17: 26 04/20/17: 30 06/08/17: 25 11/07/17: PENDING   PATHOLOGY REPORT   Surgical Pathology 11/17/16 Dr. Mariah Milling  A. Head of pancreas, stomach, and duodenum, pancreatoduodenectomy (Whipple procedure) post oncologic treatment:  - Invasive moderately differentiated ductal adenocarcinoma (2.7 cm). - Perineural invasion is seen. - Twenty lymph nodes, negative for tumor (0/20). - Very minimal treatment response, if any. - Margins of resection are negative for tumor  (2.5 mm to uncinate margin, 3 mm to bile duct and pancreatic neck margins).   B. Gallbladder and contents, cholecystectomy:  Chronic cholecystitis with acute mucosal inflammation and reactive atypia.   Synoptic Report PANCREAS (EXOCRINE)(Pancreas Exo - A)     Liver, biopsy 09/19/16 - BENIGN HEPATIC PARENCHYMA WITH ASSOCIATED FIBROSIS. - THERE IS NO EVIDENCE OF MALIGNANCY.  Diagnosis 03/09/2016 FINE NEEDLE ASPIRATION, ENDOSCOPIC, PANCREAS (SPECIMEN 1 OF 1 COLLECTED 03/09/16): MALIGNANT CELLS CONSISTENT WITH ADENOCARCINOMA. BACKGROUND NECROTIC DEBRIS AND ACUTE INFLAMMATION.  Diagnosis 03/01/2016 BILE DUCT BRUSHING(SPECIMEN 1 OF 1 COLLECTED 03/01/16): RARE ATYPICAL CELLS, SEE COMMENT.  RADIOGRAPHIC STUDIES: I have personally reviewed the radiological images as listed and agreed with the findings in the report. No results found.   CT CAP at [facility] 09/20/17 Impression: 1. Unchanged subcentimeter soft tissue nodule in the Whipple surgical bed, likely  a small lymph node. 2. No evidence of metastatic disease in the chest, abdomen, or pelvis.  CT CAP at [facility] 05/18/17 Impression: 1.Status post Whipple with soft tissue density just posterior to the SMA, which may be postsurgical, lymph node, or less likely recurrent disease. Recommend attention on follow-up 2.No evidence of metastatic disease in the chest, abdomen, and pelvis.     CT CAP W WO CONTRAST 09/04/16 IMPRESSION: 1. Poorly defined pancreatic head mass is grossly stable, with associated pancreatic ductal dilatation and portacaval adenopathy. No associated vascular encasement. 2. Common bile duct stent in place with stable biliary ductal dilatation. Left hepatic lobe atrophy. 3. Hepatomegaly.  Spleen is at the upper limits normal in size. 4. Aortic atherosclerosis (ICD10-170.0). Coronary artery calcification.  DG CEST PORT 1 09/19/16 IMPRESSION: 1. Lines and tubes noted as above. 2. Mild bibasilar subsegmental  atelectasis. 3. Cardiomegaly, no pulmonary venous congestion. 4. Free intraperitoneal air consistent with prior surgery.  EUS 03/09/2016 Dr. Ardis Hughs  - 3.5cm irregularly shaped, poorly defined mass in the pancreatic head. There is suspicious, nearby adenopathy. The mass is causing pancreatic and bililary duct obstruction (previously stented). The mass involves the portal vein for 20mm (abuttment and loss of usual tissue interface), strongly suggesting invasion. Preliminary cytology results are positive for malignancy (adenocarcinoma), await final report.  ASSESSMENT & PLAN: 62 y.o. Caucasian male, with past medical history of diabetes and hypertension, presented with epigastric pain, weight loss, and jaundice.  1. Primary pancreatic adenocarcinoma, in pancreatic head, cT2N1M0, stage IIB, ypT2N0M0 -I have previously reviewed his CT abdomen and pelvis with contrast, EUS and biopsy findings with patient and his wife in great details. -His case was previously reviewed in our GI tumor board a few days ago. CT scan and US showed possible portal vein invasion from the pancreatic tumor, this is borderline resectable disease. No evidence of distant metastasis.  -We previously reviewed the nature history of pancreatic cancer, and the overall survival rate with chemotherapy, surgery, and radiation. Patient and his wife was discouraged by the overall dismal long term survival rate (~20%)  -He received neoadjuvant chemotherapy with FOLFIRINOX and neoadjuvant chemoradiation from Aug 2017 - Jan 2018. He tolerated well  -He previously underwent diagnostic laparoscopy and possible whipple procedure on 09/19/2016 by Dr. Barry Bush to attempt removal of the pancreatic tumor. The tumor appeared to be encasing the SMV/portal vein confluence with blurring of the interface. The tumor also appeared to be invading the superior mesenteric artery on the right lateral surface. At that point, the procedure was aborted. -Dr. Mariah Milling at  [facility] performed Whipple surgery on 11/17/16, he had complete resection  -I previously reviewed his surgical pathology findings, which showed a T2 tumor, surgical margins were negative, all lymph nodes were negative. - He received additional 9 weeks of adjuvant gemcitabine in March 2018, and June - Aug 2018. -His tumor marker CA 19.9 has came down to normal, which is a good sign. No clinical concern for recurrence following chemo. -We previously discussed his 05/18/17 CT CAP from [facility] that shows no recurrence.  -I discussed that his CT CAP at [facility] from 09/20/17 also showed no signs of recurrence.  -Labs reviewed, mild anemia stable. CA 19.9 and CMP is pending  -I discussed his surveillance plan, he is 14 months s/p whipple surgery. He will f/u Dr. Mariah Milling at [facility] in July and see me in 6 months. Afterwards he can continue to f/u with Dr. Mariah Milling every 6 months. I previously discussed signs of cancer recurrence with him.  He knows what to watch for.  -he will continue to have his port flushed every 6 weeks, OK to remove if no evidence of recurrence in 1.5-2 years after surgery  -F/u in 6 months    2. Type 2 DM - he will continue medication and follow up with his primary care physician Dr. Sarajane Jews  -We previously reviewed that his blood glucose will need to be monitored closely during the chemotherapy, and his medication may need to be adjusted  -His blood glucose had been fluctuating previously and he started to see a diabetic specialist, he now checks his finger glucose 3 times a day, better controlled lately. -  I previously advised the patient to continue watching his blood glucose levels. - I previously recommended Glucerna as a nutritional supplement due to lower amount of carbohydrate  3. HTN  -He is on amlodipine and benazepril  -we previously discussed that chemotherapy may affect his blood pressure, and he is medication may need to be adjusted -His BP has improved since stopping chemo.   4. Anemia  secondary to surgery and chemotherapy  -Mild, Continue monitoring, -Improved since he finished chemotherapy.  -Hg is 10.5 previously (06/08/17) I suggested he start OTC oral iron daily. -Hgb stable 10.9 today (11/07/17)  5. abdominal wound  -Wound closed but still healing.  -fully healed with some mild scar tissue   Plan Reviewed surveillance scan from [facility], no sign of recurrence  Port flush in 6, 18 weeks F/u at [facility] in July 2019 Lab, flush and f/u in 6 months    All questions were answered. The patient knows to call the clinic with any problems, questions or concerns.  I spent 20 minutes counseling the patient face to face. The total time spent in the appointment was 25 minutes and more than 50% was on counseling.   This document serves as a record of services personally performed by Colton Merle, MD. It was created on her behalf by Theresia Bough, a trained medical scribe. The creation of this record is based on the scribe's personal observations and the provider's statements to them.   I have reviewed the above documentation for accuracy and completeness, and I agree with the above.   Colton Bush  11/07/2017

## 2017-11-07 ENCOUNTER — Telehealth: Payer: Self-pay

## 2017-11-07 ENCOUNTER — Inpatient Hospital Stay: Payer: 59 | Attending: Hematology | Admitting: Hematology

## 2017-11-07 ENCOUNTER — Encounter: Payer: Self-pay | Admitting: Hematology

## 2017-11-07 ENCOUNTER — Inpatient Hospital Stay: Payer: 59

## 2017-11-07 VITALS — BP 112/67 | HR 72 | Temp 98.0°F | Resp 18 | Ht 71.0 in | Wt 196.3 lb

## 2017-11-07 DIAGNOSIS — I2510 Atherosclerotic heart disease of native coronary artery without angina pectoris: Secondary | ICD-10-CM | POA: Diagnosis not present

## 2017-11-07 DIAGNOSIS — K219 Gastro-esophageal reflux disease without esophagitis: Secondary | ICD-10-CM | POA: Diagnosis not present

## 2017-11-07 DIAGNOSIS — Z903 Acquired absence of stomach [part of]: Secondary | ICD-10-CM | POA: Diagnosis not present

## 2017-11-07 DIAGNOSIS — I700 Atherosclerosis of aorta: Secondary | ICD-10-CM | POA: Insufficient documentation

## 2017-11-07 DIAGNOSIS — E1165 Type 2 diabetes mellitus with hyperglycemia: Secondary | ICD-10-CM | POA: Insufficient documentation

## 2017-11-07 DIAGNOSIS — C250 Malignant neoplasm of head of pancreas: Secondary | ICD-10-CM

## 2017-11-07 DIAGNOSIS — Z95828 Presence of other vascular implants and grafts: Secondary | ICD-10-CM

## 2017-11-07 DIAGNOSIS — Z452 Encounter for adjustment and management of vascular access device: Secondary | ICD-10-CM | POA: Insufficient documentation

## 2017-11-07 DIAGNOSIS — Z923 Personal history of irradiation: Secondary | ICD-10-CM

## 2017-11-07 DIAGNOSIS — I10 Essential (primary) hypertension: Secondary | ICD-10-CM | POA: Insufficient documentation

## 2017-11-07 DIAGNOSIS — Z79899 Other long term (current) drug therapy: Secondary | ICD-10-CM | POA: Diagnosis not present

## 2017-11-07 DIAGNOSIS — M1990 Unspecified osteoarthritis, unspecified site: Secondary | ICD-10-CM | POA: Diagnosis not present

## 2017-11-07 DIAGNOSIS — Z9221 Personal history of antineoplastic chemotherapy: Secondary | ICD-10-CM | POA: Diagnosis not present

## 2017-11-07 DIAGNOSIS — R160 Hepatomegaly, not elsewhere classified: Secondary | ICD-10-CM | POA: Diagnosis not present

## 2017-11-07 DIAGNOSIS — E785 Hyperlipidemia, unspecified: Secondary | ICD-10-CM | POA: Insufficient documentation

## 2017-11-07 DIAGNOSIS — D6481 Anemia due to antineoplastic chemotherapy: Secondary | ICD-10-CM | POA: Insufficient documentation

## 2017-11-07 DIAGNOSIS — IMO0001 Reserved for inherently not codable concepts without codable children: Secondary | ICD-10-CM

## 2017-11-07 DIAGNOSIS — T451X5S Adverse effect of antineoplastic and immunosuppressive drugs, sequela: Secondary | ICD-10-CM | POA: Insufficient documentation

## 2017-11-07 DIAGNOSIS — Z794 Long term (current) use of insulin: Secondary | ICD-10-CM | POA: Insufficient documentation

## 2017-11-07 LAB — CBC WITH DIFFERENTIAL/PLATELET
Basophils Absolute: 0.1 10*3/uL (ref 0.0–0.1)
Basophils Relative: 1 %
Eosinophils Absolute: 0.2 10*3/uL (ref 0.0–0.5)
Eosinophils Relative: 2 %
HCT: 34.2 % — ABNORMAL LOW (ref 38.4–49.9)
Hemoglobin: 10.9 g/dL — ABNORMAL LOW (ref 13.0–17.1)
Lymphocytes Relative: 9 %
Lymphs Abs: 0.9 10*3/uL (ref 0.9–3.3)
MCH: 24.7 pg — ABNORMAL LOW (ref 27.2–33.4)
MCHC: 31.9 g/dL — ABNORMAL LOW (ref 32.0–36.0)
MCV: 77.6 fL — ABNORMAL LOW (ref 79.3–98.0)
Monocytes Absolute: 0.8 10*3/uL (ref 0.1–0.9)
Monocytes Relative: 8 %
Neutro Abs: 8.2 10*3/uL — ABNORMAL HIGH (ref 1.5–6.5)
Neutrophils Relative %: 80 %
Platelets: 319 10*3/uL (ref 140–400)
RBC: 4.41 MIL/uL (ref 4.20–5.82)
RDW: 16.2 % — ABNORMAL HIGH (ref 11.0–14.6)
WBC: 10.2 10*3/uL (ref 4.0–10.3)

## 2017-11-07 LAB — COMPREHENSIVE METABOLIC PANEL
ALT: 29 U/L (ref 0–55)
AST: 21 U/L (ref 5–34)
Albumin: 3.8 g/dL (ref 3.5–5.0)
Alkaline Phosphatase: 137 U/L (ref 40–150)
Anion gap: 9 (ref 3–11)
BUN: 19 mg/dL (ref 7–26)
CO2: 26 mmol/L (ref 22–29)
Calcium: 9.5 mg/dL (ref 8.4–10.4)
Chloride: 105 mmol/L (ref 98–109)
Creatinine, Ser: 0.77 mg/dL (ref 0.70–1.30)
GFR calc Af Amer: >60 mL/min (ref >60)
GFR calc non Af Amer: >60 mL/min (ref >60)
Glucose, Bld: 159 mg/dL — ABNORMAL HIGH (ref 70–140)
Potassium: 3.9 mmol/L (ref 3.5–5.1)
Sodium: 140 mmol/L (ref 136–145)
Total Bilirubin: 0.5 mg/dL (ref 0.2–1.2)
Total Protein: 7.8 g/dL (ref 6.4–8.3)

## 2017-11-07 MED ORDER — HEPARIN SOD (PORK) LOCK FLUSH 100 UNIT/ML IV SOLN
500.0000 [IU] | Freq: Once | INTRAVENOUS | Status: AC | PRN
  Administered 2017-11-07: 500 [IU] via INTRAVENOUS
  Filled 2017-11-07: qty 5

## 2017-11-07 MED ORDER — SODIUM CHLORIDE 0.9 % IJ SOLN
10.0000 mL | INTRAMUSCULAR | Status: DC | PRN
  Administered 2017-11-07: 10 mL via INTRAVENOUS
  Filled 2017-11-07: qty 10

## 2017-11-07 NOTE — Telephone Encounter (Signed)
Printed avs and calender of upcoming appointment.m per 4/10 los

## 2017-11-08 LAB — CANCER ANTIGEN 19-9: CA 19-9: 39 U/mL — ABNORMAL HIGH (ref 0–35)

## 2017-11-13 ENCOUNTER — Telehealth: Payer: Self-pay | Admitting: *Deleted

## 2017-11-13 NOTE — Telephone Encounter (Signed)
-----   Message from Truitt Merle, MD sent at 11/10/2017 11:53 AM EDT ----- Please let him know his CA19.9 is slightly elevated this time, I recommend repeating lab in 4-6 weeks, please schedule, thanks  Truitt Merle  11/10/2017

## 2017-11-13 NOTE — Telephone Encounter (Signed)
Spoke with pt and informed pt of CA 19.9 slightly elevated.  Informed pt that lab will be rechecked at next port flush on 5/22 as per Dr. Ernestina Penna instructions.  Pt voiced understanding.

## 2017-12-19 ENCOUNTER — Encounter: Payer: Self-pay | Admitting: Family Medicine

## 2017-12-19 ENCOUNTER — Ambulatory Visit: Payer: 59 | Admitting: Family Medicine

## 2017-12-19 ENCOUNTER — Telehealth: Payer: Self-pay | Admitting: *Deleted

## 2017-12-19 ENCOUNTER — Inpatient Hospital Stay: Payer: 59

## 2017-12-19 ENCOUNTER — Inpatient Hospital Stay: Payer: 59 | Attending: Hematology

## 2017-12-19 ENCOUNTER — Ambulatory Visit: Payer: Self-pay | Admitting: *Deleted

## 2017-12-19 VITALS — BP 150/80 | HR 78 | Temp 98.2°F | Wt 197.6 lb

## 2017-12-19 DIAGNOSIS — Z95828 Presence of other vascular implants and grafts: Secondary | ICD-10-CM

## 2017-12-19 DIAGNOSIS — E1165 Type 2 diabetes mellitus with hyperglycemia: Secondary | ICD-10-CM

## 2017-12-19 DIAGNOSIS — Z923 Personal history of irradiation: Secondary | ICD-10-CM | POA: Insufficient documentation

## 2017-12-19 DIAGNOSIS — Z9221 Personal history of antineoplastic chemotherapy: Secondary | ICD-10-CM | POA: Insufficient documentation

## 2017-12-19 DIAGNOSIS — C250 Malignant neoplasm of head of pancreas: Secondary | ICD-10-CM | POA: Diagnosis present

## 2017-12-19 DIAGNOSIS — Z903 Acquired absence of stomach [part of]: Secondary | ICD-10-CM | POA: Insufficient documentation

## 2017-12-19 DIAGNOSIS — Z794 Long term (current) use of insulin: Secondary | ICD-10-CM | POA: Diagnosis not present

## 2017-12-19 LAB — CBC WITH DIFFERENTIAL/PLATELET
Basophils Absolute: 0.1 10*3/uL (ref 0.0–0.1)
Basophils Relative: 1 %
Eosinophils Absolute: 0.2 10*3/uL (ref 0.0–0.5)
Eosinophils Relative: 3 %
HCT: 33.9 % — ABNORMAL LOW (ref 38.4–49.9)
Hemoglobin: 10.5 g/dL — ABNORMAL LOW (ref 13.0–17.1)
Lymphocytes Relative: 17 %
Lymphs Abs: 1 10*3/uL (ref 0.9–3.3)
MCH: 24.2 pg — ABNORMAL LOW (ref 27.2–33.4)
MCHC: 31 g/dL — ABNORMAL LOW (ref 32.0–36.0)
MCV: 78.3 fL — ABNORMAL LOW (ref 79.3–98.0)
Monocytes Absolute: 0.4 10*3/uL (ref 0.1–0.9)
Monocytes Relative: 7 %
Neutro Abs: 4.4 10*3/uL (ref 1.5–6.5)
Neutrophils Relative %: 72 %
Platelets: 301 10*3/uL (ref 140–400)
RBC: 4.33 MIL/uL (ref 4.20–5.82)
RDW: 16.9 % — ABNORMAL HIGH (ref 11.0–14.6)
WBC: 6.1 10*3/uL (ref 4.0–10.3)

## 2017-12-19 LAB — COMPREHENSIVE METABOLIC PANEL
ALT: 28 U/L (ref 0–55)
AST: 16 U/L (ref 5–34)
Albumin: 4.1 g/dL (ref 3.5–5.0)
Alkaline Phosphatase: 134 U/L (ref 40–150)
Anion gap: 9 (ref 3–11)
BUN: 15 mg/dL (ref 7–26)
CO2: 23 mmol/L (ref 22–29)
Calcium: 9.4 mg/dL (ref 8.4–10.4)
Chloride: 103 mmol/L (ref 98–109)
Creatinine, Ser: 1.01 mg/dL (ref 0.70–1.30)
GFR calc Af Amer: >60 mL/min (ref >60)
GFR calc non Af Amer: >60 mL/min (ref >60)
Glucose, Bld: 556 mg/dL (ref 70–140)
Potassium: 4.1 mmol/L (ref 3.5–5.1)
Sodium: 135 mmol/L — ABNORMAL LOW (ref 136–145)
Total Bilirubin: 0.3 mg/dL (ref 0.2–1.2)
Total Protein: 7.8 g/dL (ref 6.4–8.3)

## 2017-12-19 LAB — POCT GLUCOSE (DEVICE FOR HOME USE): POC Glucose: 435 mg/dl — AB (ref 70–99)

## 2017-12-19 MED ORDER — HEPARIN SOD (PORK) LOCK FLUSH 100 UNIT/ML IV SOLN
500.0000 [IU] | Freq: Once | INTRAVENOUS | Status: AC | PRN
  Administered 2017-12-19: 500 [IU] via INTRAVENOUS
  Filled 2017-12-19: qty 5

## 2017-12-19 MED ORDER — SODIUM CHLORIDE 0.9 % IJ SOLN
10.0000 mL | INTRAMUSCULAR | Status: DC | PRN
Start: 2017-12-19 — End: 2017-12-19
  Administered 2017-12-19: 10 mL via INTRAVENOUS
  Filled 2017-12-19: qty 10

## 2017-12-19 NOTE — Progress Notes (Signed)
Subjective:     Patient ID: Colton Bush, male   DOB: [birth year], 62 y.o.   MRN: [id number]  HPI Patient seen for hyperglycemia with panic lab value earlier today of glucose 556.  He has history of pancreatic cancer is followed by [facility] and also here locally. He went for labs today at 12:30 and states about 11:30 AM had 7 pieces of pizza. He also had some type of [employer] bar around 6:30 along with a Glucerna.  He didn't realize he was going to get labs when he ate the pizza.  He was diagnosed with pancreatic cancer back in August 2017. He has roughly 20 year history of diabetes and is followed endocrinology. He is on insulin regimen of Levemir 20 units once daily and NovoLog sliding scale. He did not apparently take any insulin at lunchtime today. He has had tendencies toward hypoglycemia in the past. Denies any nausea or vomiting. No fever. Generally feels well his time. Surprisingly, he is not having any polyuria or polydipsia  Other lab work from earlier today reviewed and relatively stable. Electrolytes were stable.  creatinine stable. Hemoglobin 10.5 which is near his baseline. Normal white blood cell count  Past Medical History:  Diagnosis Date  . Arthritis    left hand  . Bronchitis 1977  . Cancer ([place]) 03/09/2016   pancreatic cancer, sees Dr. Cristino Martes at [facility]   . Depression    takes Cymbalta daily  . Diabetes mellitus type II    sees Dr. Chalmers Cater   . GERD (gastroesophageal reflux disease)    takes Omeprazole daily  . H/O hiatal hernia   . Hyperlipidemia    takes Zocor daily  . Hypertension    takes Amlodipine daily  . Hypoglycemia 06/18/2017  . Neck pain    C4-7 stenosis and herniated disc  . Neuromuscular disorder ([place])    hiatal hernia  . Scoliosis    slight  . Spinal cord injury, C5-C7 ([place])    c4-c7  . Stiffness of hand joint    d/t cervical issues   Past Surgical History:  Procedure Laterality Date  . ANTERIOR CERVICAL DECOMP/DISCECTOMY FUSION  08/18/2011   Procedure: ANTERIOR CERVICAL DECOMPRESSION/DISCECTOMY FUSION 3 LEVELS;  Surgeon: Winfield Cunas, MD;  Location: [facility];  Service: Neurosurgery;  Laterality: N/A;  Anterior Cervical Four-Five/Five-Six/Six-Seven Decompression with Fusion, Plating, and Bonegraft  . CARPAL TUNNEL RELEASE  2013   bilateral, per Dr. Christella Noa   . COLONOSCOPY  10-30-14   per Dr. Olevia Perches, clear, repeat in 10 yrs   . egd with esophageal dilation  9-08   per Dr. Olevia Perches  . ERCP N/A 03/01/2016   Procedure: ENDOSCOPIC RETROGRADE CHOLANGIOPANCREATOGRAPHY (ERCP) with brushings and stent;  Surgeon: Doran Stabler, MD;  Location: [facility];  Service: Endoscopy;  Laterality: N/A;  . EUS N/A 03/09/2016   Procedure: ESOPHAGEAL ENDOSCOPIC ULTRASOUND (EUS) RADIAL;  Surgeon: Milus Banister, MD;  Location: [facility];  Service: Endoscopy;  Laterality: N/A;  . lymph nodes biopsy    . melanoma rt calf  1999  . PORTACATH PLACEMENT Left 03/22/2016   Procedure: INSERTION PORT-A-CATH;  Surgeon: Stark Klein, MD;  Location: [facility];  Service: General;  Laterality: Left;  . SPINE SURGERY    . TONSILLECTOMY     as a child  . ULNAR TUNNEL RELEASE  2013   right arm, per Dr. Christella Noa   . UPPER GASTROINTESTINAL ENDOSCOPY    . WHIPPLE PROCEDURE N/A 09/19/2016   Procedure: DIAGNOSTIC LAPAROSCOPY,  LAPAROSCOPIC LIVER BIOPSY, RETROPERITONEAL EXPLORATION, INTRAOPERATIVE ULTRASOUND;  Surgeon: Stark Klein, MD;  Location: [facility];  Service: General;  Laterality: N/A;    reports that he has never smoked. He has never used smokeless tobacco. He reports that he does not drink alcohol or use drugs. family history includes Anesthesia problems in his mother; Dementia in his mother; Diabetes in his sister; Heart disease in his father and mother; Heart disease (age of onset: 29) in his brother; Stroke in his sister. Allergies  Allergen Reactions  . No Known Allergies      Review of Systems  Constitutional: Negative for appetite change, chills, fever  and unexpected weight change.  Respiratory: Negative for cough and shortness of breath.   Cardiovascular: Negative for chest pain.  Gastrointestinal: Negative for abdominal pain, nausea and vomiting.  Endocrine: Negative for polydipsia and polyuria.  Genitourinary: Negative for dysuria.  Neurological: Negative for dizziness.       Objective:   Physical Exam  Constitutional: He is oriented to person, place, and time. He appears well-developed and well-nourished.  HENT:  Mouth/Throat: Oropharynx is clear and moist.  Cardiovascular: Normal rate.  Pulmonary/Chest: Effort normal and breath sounds normal. He has no wheezes. He has no rales.  Abdominal: Soft. There is no tenderness.  Neurological: He is alert and oriented to person, place, and time.       Assessment:     Type 2 diabetes with hyperglycemia with nonfasting blood sugar earlier today of 556. Poor compliance with diet    Plan:     -Repeat blood sugar here 435 -We offered to give some NovoLog insulin but he states he prefers to go ahead and give at home and getting back on his sliding scale. Continue current dose of Levemir -Encouraged to drink plenty of water to avoid dehydration -Generally seems have poor understanding of diet and we reviewed better choices for eating. He states he's had previous nutritional counseling in the past Monitor blood sugar at least TID over the next couple of days.  Eulas Post MD [facility]

## 2017-12-19 NOTE — Patient Instructions (Signed)
Drink plenty of water  Monitor blood sugars regularly and stay on sliding scale with Novolog as per Dr Suzette Battiest.  Follow low glycemic diet

## 2017-12-19 NOTE — Telephone Encounter (Signed)
Tu, RN from the [facility] called to let Dr. Sarajane Jews know Colton Bush glucose is 556 from blood work drawn today around 12:30PM.  Pt has left the [facility] but was feeling fine per RN when he left.   As instructions need to be called to the pt directly not to the [facility] since he is no longer there.  See triage notes below.  I have routed a high priority note to Dr. Sarajane Jews and notified the flow coordinator via phone of the glucose 556. Reason for Disposition . Blood glucose > 400 mg/dl (22 mmol/l)  Answer Assessment - Initial Assessment Questions 1. BLOOD GLUCOSE: "What is your blood glucose level?"      556  Tu, RN at the [facility] calling in to let Dr. Sarajane Jews know his glucose is elevated.   Pt has left the [place] so Dr. Barbie Banner office needs to contact the pt directly if there are further instructions. 2. ONSET: "When did you check the blood glucose?"     Had labs done today at the [place] [facility] about 12:30PM. 3. USUAL RANGE: "What is your glucose level usually?" (e.g., usual fasting morning value, usual evening value)     See above 4. KETONES: "Do you check for ketones (urine or blood test strips)?" If yes, ask: "What does the test show now?"      *No Answer* 5. TYPE 1 or 2:  "Do you know what type of diabetes you have?"  (e.g., Type 1, Type 2, Gestational; doesn't know)      Use sliding scale insulin. 6. INSULIN: "Do you take insulin?" If yes, ask: "Have you missed any shots recently?"     Yes.   Uses sliding scale 7. DIABETES PILLS: "Do you take any pills for your diabetes?" If yes, ask: "Have you missed taking any pills recently?"     Don't know.    Pt did tell Tu that he had lunch and also drank an Ensure or similar drink. 8. OTHER SYMPTOMS: "Do you have any symptoms?" (e.g., fever, frequent urination, difficulty breathing, dizziness, weakness, vomiting)     Tu stated the pt felt fine when he left the [facility]. 9. PREGNANCY: "Is there any chance you are pregnant?"  "When was your last menstrual period?"     N/A  Protocols used: DIABETES - HIGH BLOOD SUGAR-A-AH

## 2017-12-19 NOTE — Telephone Encounter (Signed)
Pt had lab and port flush today.  Glucose results  556.    Dr. [name] notified.   Spoke with pt and was informed that pt was not aware of labs today; had eaten lunch , and drink Ensure prior to lab draw. Instructed pt to contact his PCP Dr. Alysia Penna for further instructions.  Pt voiced understanding. Spoke with Jenny Reichmann, RN at pt engagement office  [facility] ).  Informed Jenny Reichmann of above info.  Jenny Reichmann stated she would relay message to Dr. Sarajane Jews for review.  Jenny Reichmann understood that further instructions from Dr. Sarajane Jews will be communicated directly to pt.

## 2017-12-19 NOTE — Telephone Encounter (Signed)
Spoke with pt and he states that he did drink a Glucerna this morning and stopped at [employer] for lunch. He did not realize he would have labs drawn today. He has been checking his blood sugar daily and it has been in the mid-200's. He realizes he is not eating a proper diet and admits that when he is bored at home he snacks a lot. Asked pt to check his blood sugar while on phone with me, it registered as HIGH and did not give a number. I asked the pt did he realize what this means and he mostly did not, explained to pt this means his blood sugar is too high for the machine to register. Pt does state he has history of DKA with hospitalization. Pt states he has not followed his recent insulin instructions from Dr. Chalmers Cater. Advised pt that controlling his glucose is imperative, given all his other health concerns at this time. Pt has been scheduled to see Dr. Elease Hashimoto today. Pt advised to please have someone drive him here, and that if he starts to have any symptoms of elevated glucose such as clammy skin, lightheaded, dizzy or feels like he may pass out he needs to call EMS immediately. Pt voiced understanding.   Dr. Elease Hashimoto / Dr. Sarajane Jews - FYI. Thanks!

## 2017-12-20 LAB — CANCER ANTIGEN 19-9: CA 19-9: 43 U/mL — ABNORMAL HIGH (ref 0–35)

## 2018-01-01 ENCOUNTER — Telehealth: Payer: Self-pay | Admitting: Hematology

## 2018-01-01 NOTE — Telephone Encounter (Signed)
I called Colton Bush and discussed his CA19.9 result with him from 12/19/2017, which was slightly elevated.  Colton Bush informed me that he has a CT scan, labs and appointment with the nurse practitioner at [facility] on January 17, 2018.  He will bring me a copy of the CT scan.  I told Colton Bush to contact me if the scan is abnormal and I will see him after his visit at [facility].  If his CT CT scan and lab looks good, I will see him as scheduled in Oct.  Truitt Merle  01/01/2018

## 2018-01-14 ENCOUNTER — Other Ambulatory Visit: Payer: Self-pay | Admitting: Family Medicine

## 2018-02-21 IMAGING — DX DG CHEST 1V PORT
1 series · 1 of 1 positions shown · non-contrast
Comparison: Chest radiograph dated 09/19/2016

CLINICAL DATA: 61-year-old male with hypoglycemia.

EXAM:
PORTABLE CHEST 1 VIEW

[chest ap]
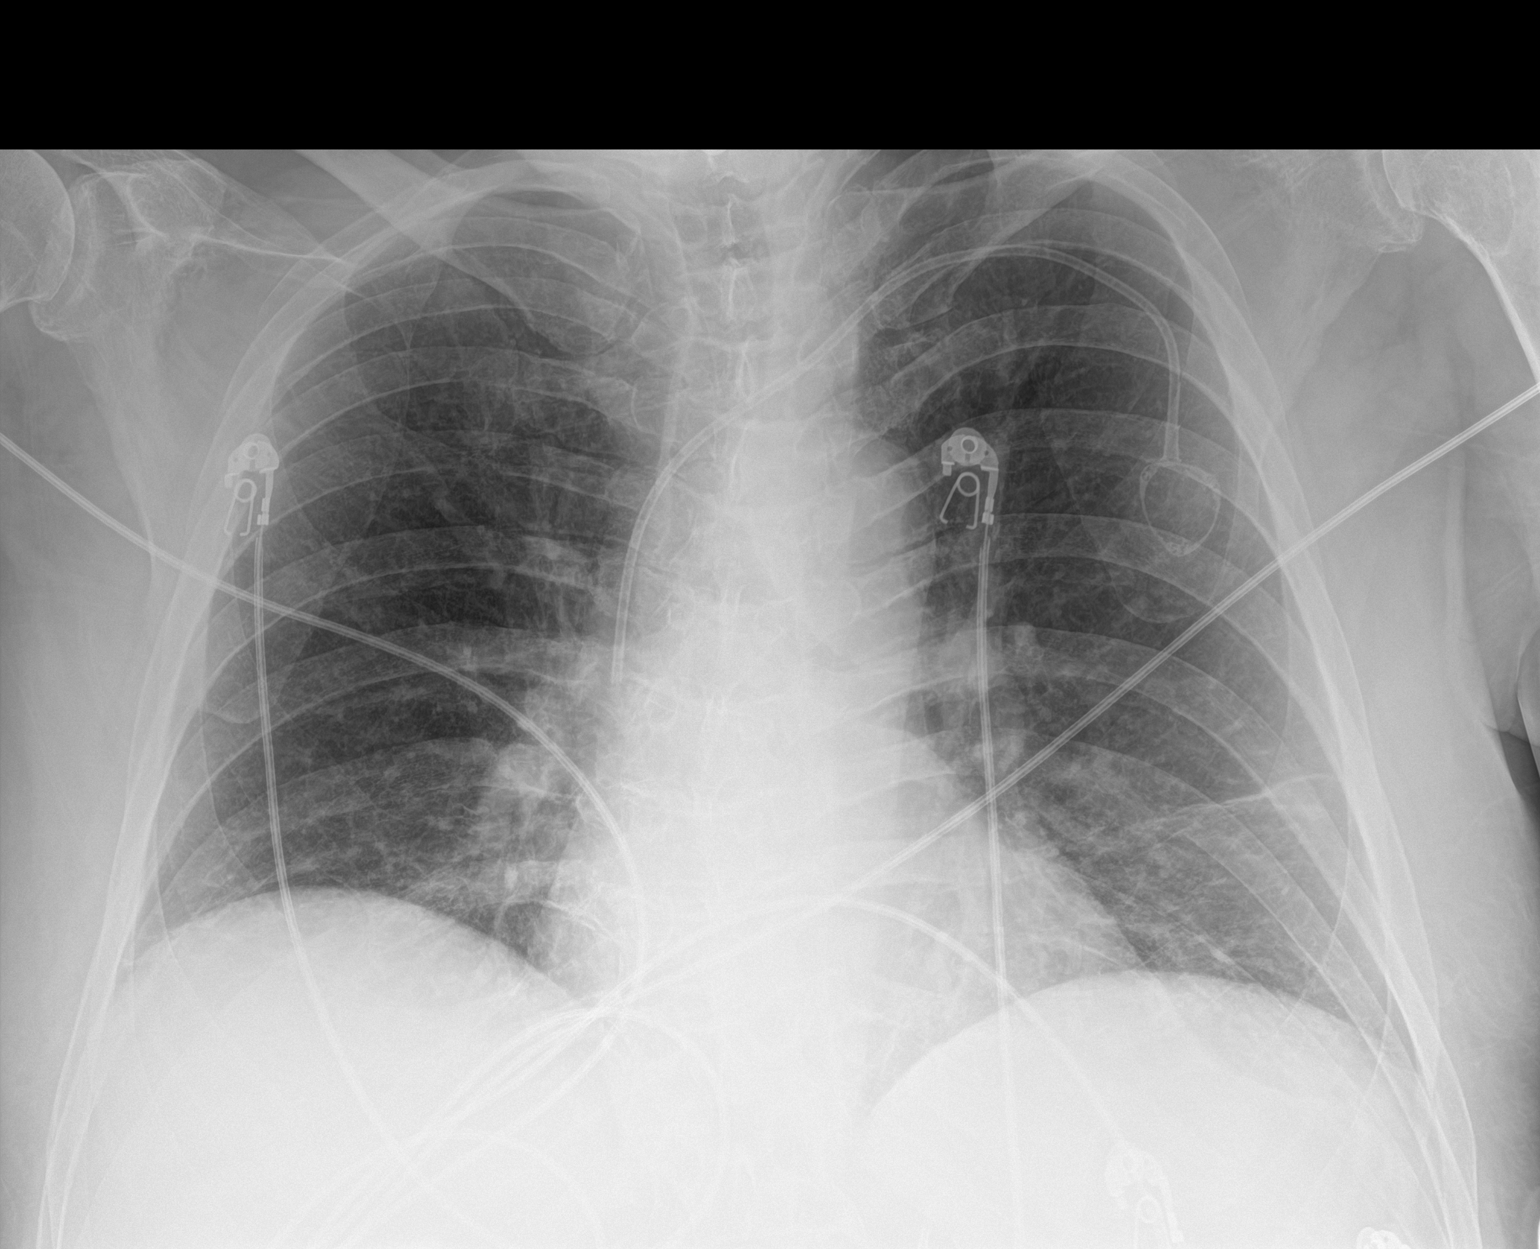

[1 of 1 positions shown; findings below may reference images not displayed]

FINDINGS: Left pectoral Port-A-Cath with tip over central SVC close to the
cavoatrial junction. Minimal bibasilar atelectatic changes. No focal
consolidation, pleural effusion, or pneumothorax. The cardiac
silhouette is within normal limits. Cervical fusion plate and screws
partially visualized. No acute osseous pathology.
IMPRESSION: No active disease.

## 2018-02-21 IMAGING — CT CT ABD-PELV W/ CM
2 of 5 series · 16 of 46 positions shown, 18 images · IV contrast (Omni 300)
Comparison: PET-CT 05/18/2017.  CT abdomen and pelvis 09/04/2016

CLINICAL DATA: Difficulty with blood sugar.

EXAM:
CT ABDOMEN AND PELVIS WITH CONTRAST
TECHNIQUE: Multidetector CT imaging of the abdomen and pelvis was performed
using the standard protocol following bolus administration of
intravenous contrast.
CONTRAST:  100mL [id number]-INN IOPAMIDOL (7Z72WF-INN) INJECTION 61%

[Series 3: a/p w/ 5mm · axial · 0.87mm/px · z∈[+924,+1349]mm · 13 of 96 slices shown, 15 images]
[im 6/96  soft-tissue]
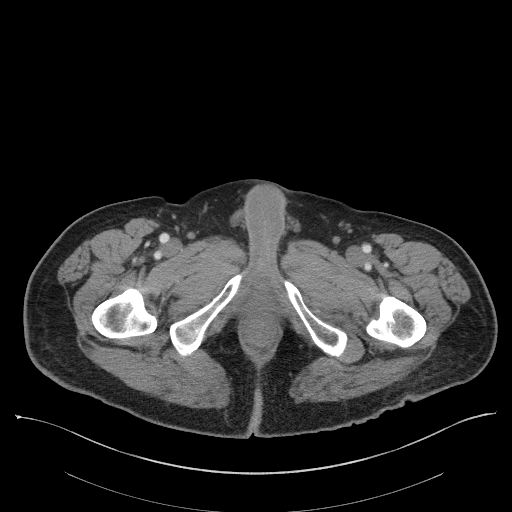
[im 6/96  bone]
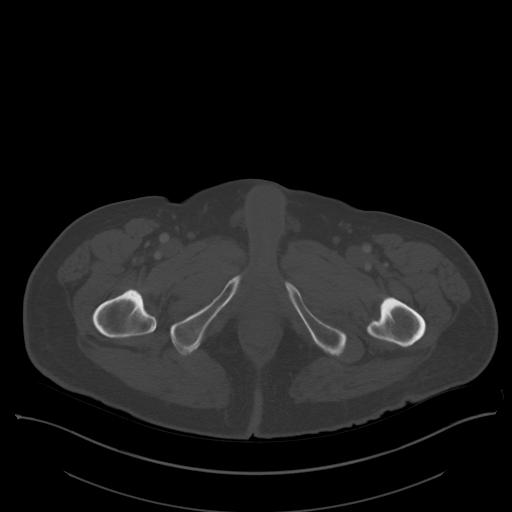
[im 16/96  soft-tissue]
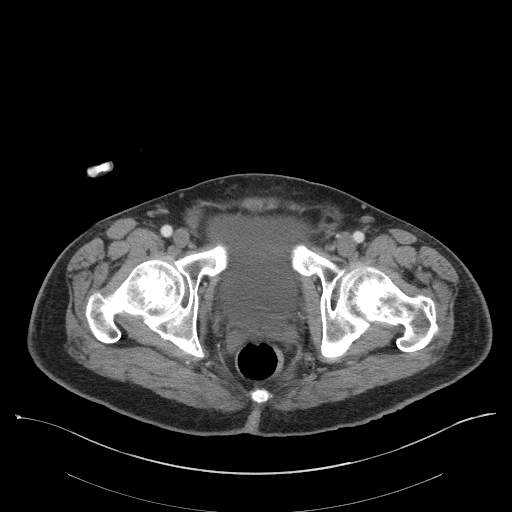
[im 21/96  soft-tissue]
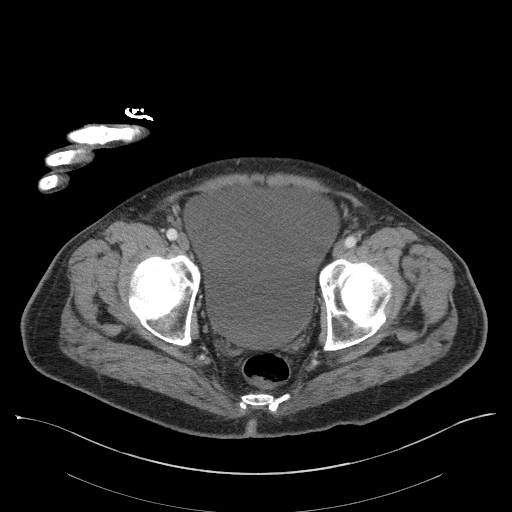
[im 26/96  soft-tissue]
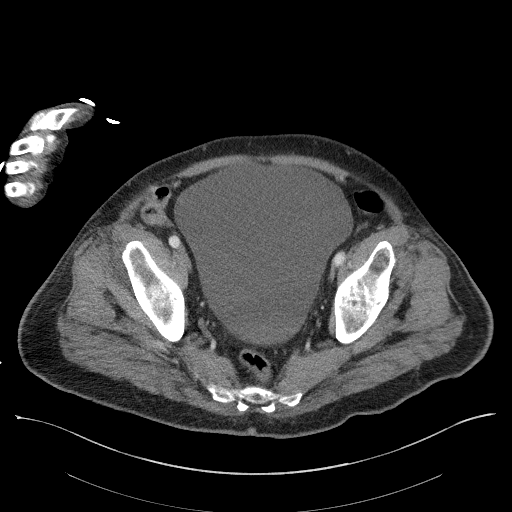
[im 36/96  soft-tissue]
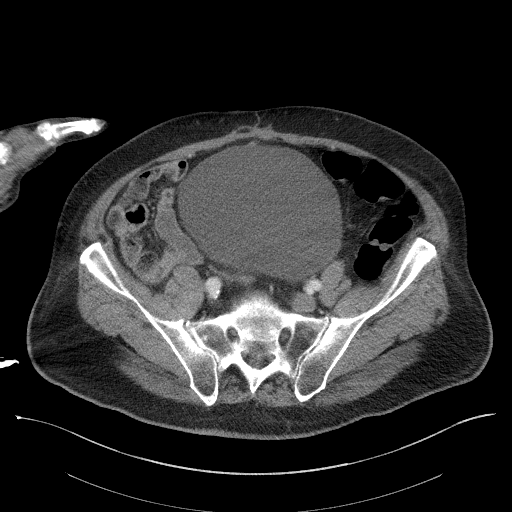
[im 41/96  soft-tissue]
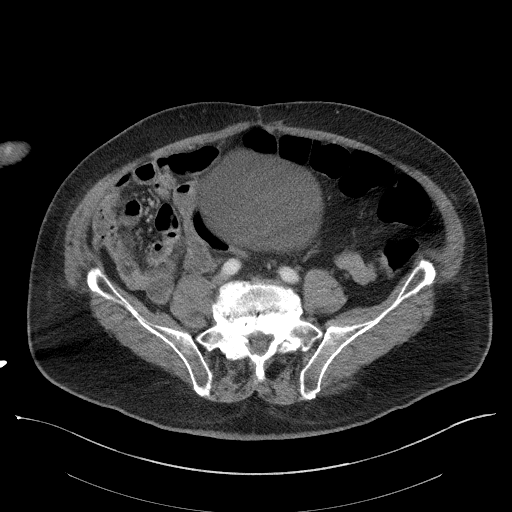
[im 51/96  soft-tissue]
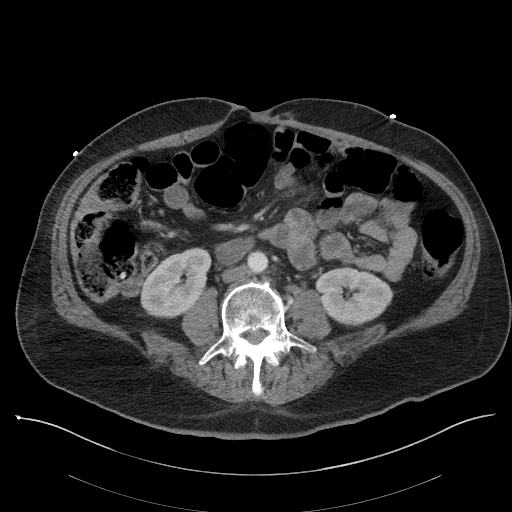
[im 56/96  soft-tissue]
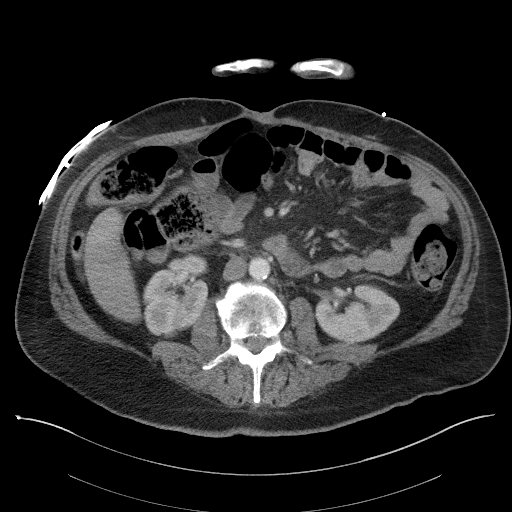
[im 61/96  soft-tissue]
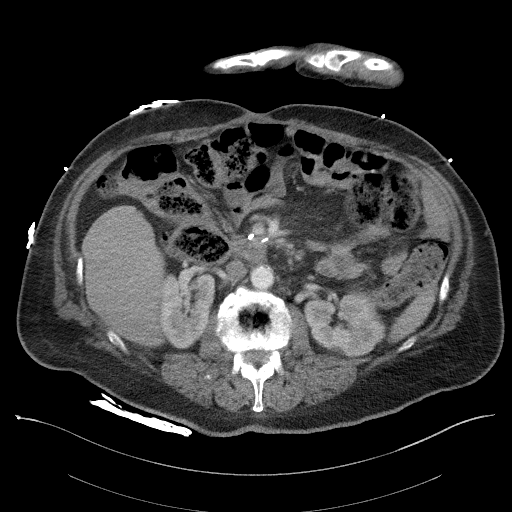
[im 61/96  bone]
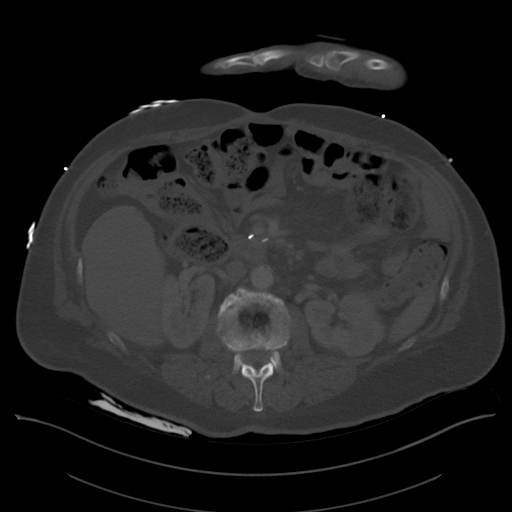
[im 71/96  soft-tissue]
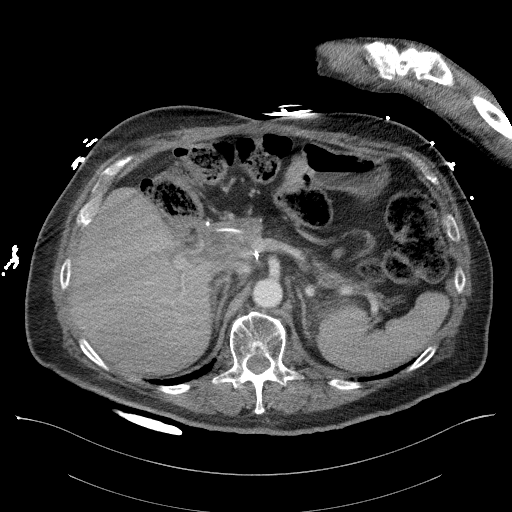
[im 76/96  soft-tissue]
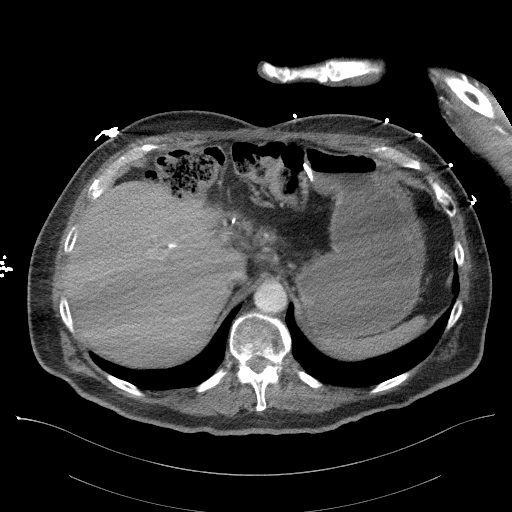
[im 81/96  soft-tissue]
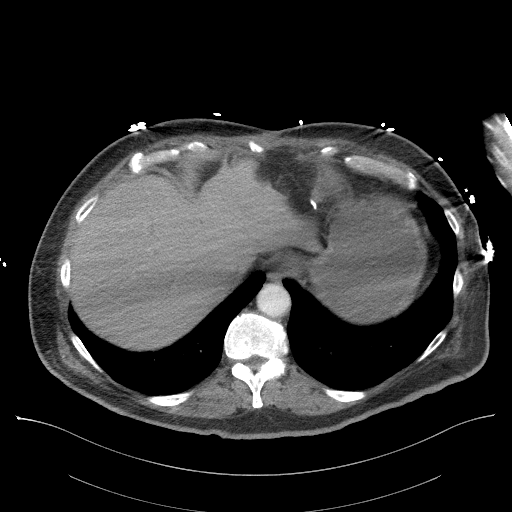
[im 91/96  soft-tissue]
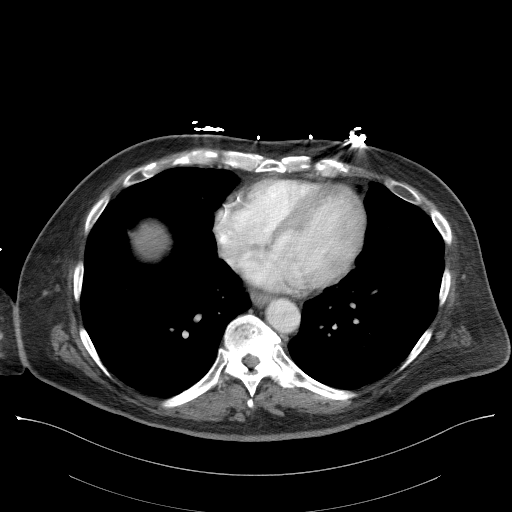

[Series 6: a/p w/ cor · coronal · 0.94mm/px · 3 of 160 slices shown]
[im 54/160  soft-tissue]
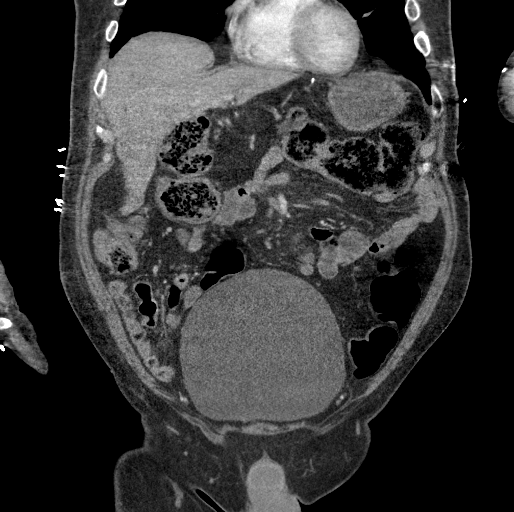
[im 71/160  soft-tissue]
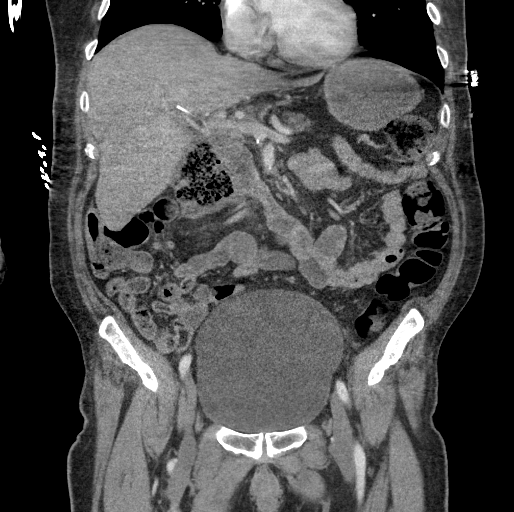
[im 89/160  soft-tissue]
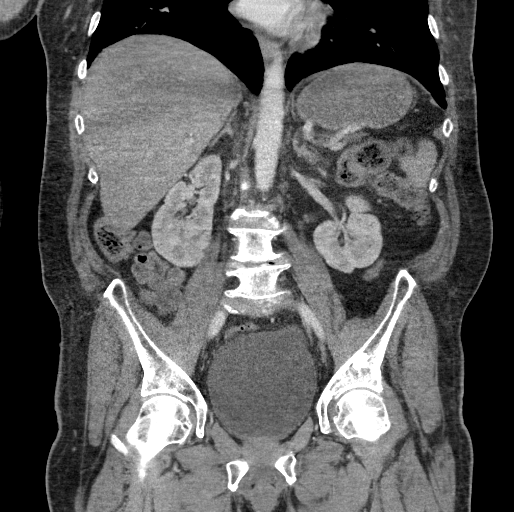

[16 of 46 positions shown; findings below may reference images not displayed]

FINDINGS: Lower chest: Lung bases are clear.

Hepatobiliary: Surgical absence of the gallbladder. No bile duct
dilatation. No focal liver lesions identified. Stent in the common
bile duct.

Pancreas: Partial pancreatectomy with resection of the head of the
pancreas. No pancreatic ductal dilatation or peripancreatic fluid.

Spleen: Normal in size without focal abnormality.

Adrenals/Urinary Tract: Adrenal glands are unremarkable. Kidneys are
normal, without renal calculi, focal lesion, or hydronephrosis.
Bladder is distended. This may be physiologic or due to urinary
retention.

Stomach/Bowel: Stomach, small bowel, and colon are not abnormally
distended. Diffusely stool-filled colon. No wall thickening or
inflammatory changes. Appendix is not visualized.

Vascular/Lymphatic: Aortic atherosclerosis. No enlarged abdominal or
pelvic lymph nodes.

Reproductive: Prostate gland is enlarged, measuring 5.6 cm diameter.

Other: No free air or free fluid in the abdomen. Scarring along the
anterior abdominal wall with mild fat herniation.

Musculoskeletal: Degenerative changes in the spine. No destructive
bone lesions.
IMPRESSION: 1. Postoperative changes with surgical absence of the gallbladder,
partial pancreatectomy, and stent in the common bile duct. No bile
duct dilatation.
2. No evidence of bowel obstruction or inflammation.
3. Prostate gland is enlarged.
4. Aortic atherosclerosis.
5. Postoperative scarring in the anterior abdominal wall with mild
focal fat herniation.
6. Bladder distention may be physiologic or may indicate urinary
retention.

## 2018-03-13 ENCOUNTER — Inpatient Hospital Stay: Payer: 59 | Attending: Hematology

## 2018-03-13 DIAGNOSIS — Z452 Encounter for adjustment and management of vascular access device: Secondary | ICD-10-CM | POA: Diagnosis not present

## 2018-03-13 DIAGNOSIS — Z95828 Presence of other vascular implants and grafts: Secondary | ICD-10-CM

## 2018-03-13 DIAGNOSIS — C250 Malignant neoplasm of head of pancreas: Secondary | ICD-10-CM | POA: Diagnosis not present

## 2018-03-13 DIAGNOSIS — Z9221 Personal history of antineoplastic chemotherapy: Secondary | ICD-10-CM | POA: Diagnosis not present

## 2018-03-13 DIAGNOSIS — Z923 Personal history of irradiation: Secondary | ICD-10-CM | POA: Insufficient documentation

## 2018-03-13 DIAGNOSIS — Z903 Acquired absence of stomach [part of]: Secondary | ICD-10-CM | POA: Diagnosis not present

## 2018-03-13 MED ORDER — SODIUM CHLORIDE 0.9 % IJ SOLN
10.0000 mL | INTRAMUSCULAR | Status: DC | PRN
Start: 2018-03-13 — End: 2018-03-13
  Administered 2018-03-13: 10 mL via INTRAVENOUS
  Filled 2018-03-13: qty 10

## 2018-03-13 MED ORDER — HEPARIN SOD (PORK) LOCK FLUSH 100 UNIT/ML IV SOLN
500.0000 [IU] | Freq: Once | INTRAVENOUS | Status: AC | PRN
  Administered 2018-03-13: 500 [IU] via INTRAVENOUS
  Filled 2018-03-13: qty 5

## 2018-03-13 NOTE — Patient Instructions (Signed)
Implanted Port Home Guide An implanted port is a type of central line that is placed under the skin. Central lines are used to provide IV access when treatment or nutrition needs to be given through a person's veins. Implanted ports are used for long-term IV access. An implanted port may be placed because:  You need IV medicine that would be irritating to the small veins in your hands or arms.  You need long-term IV medicines, such as antibiotics.  You need IV nutrition for a long period.  You need frequent blood draws for lab tests.  You need dialysis.  Implanted ports are usually placed in the chest area, but they can also be placed in the upper arm, the abdomen, or the leg. An implanted port has two main parts:  Reservoir. The reservoir is round and will appear as a small, raised area under your skin. The reservoir is the part where a needle is inserted to give medicines or draw blood.  Catheter. The catheter is a thin, flexible tube that extends from the reservoir. The catheter is placed into a large vein. Medicine that is inserted into the reservoir goes into the catheter and then into the vein.  How will I care for my incision site? Do not get the incision site wet. Bathe or shower as directed by your health care provider. How is my port accessed? Special steps must be taken to access the port:  Before the port is accessed, a numbing cream can be placed on the skin. This helps numb the skin over the port site.  Your health care provider uses a sterile technique to access the port. ? Your health care provider must put on a mask and sterile gloves. ? The skin over your port is cleaned carefully with an antiseptic and allowed to dry. ? The port is gently pinched between sterile gloves, and a needle is inserted into the port.  Only "non-coring" port needles should be used to access the port. Once the port is accessed, a blood return should be checked. This helps ensure that the port  is in the vein and is not clogged.  If your port needs to remain accessed for a constant infusion, a clear (transparent) bandage will be placed over the needle site. The bandage and needle will need to be changed every week, or as directed by your health care provider.  Keep the bandage covering the needle clean and dry. Do not get it wet. Follow your health care provider's instructions on how to take a shower or bath while the port is accessed.  If your port does not need to stay accessed, no bandage is needed over the port.  What is flushing? Flushing helps keep the port from getting clogged. Follow your health care provider's instructions on how and when to flush the port. Ports are usually flushed with saline solution or a medicine called heparin. The need for flushing will depend on how the port is used.  If the port is used for intermittent medicines or blood draws, the port will need to be flushed: ? After medicines have been given. ? After blood has been drawn. ? As part of routine maintenance.  If a constant infusion is running, the port may not need to be flushed.  How long will my port stay implanted? The port can stay in for as long as your health care provider thinks it is needed. When it is time for the port to come out, surgery will be   done to remove it. The procedure is similar to the one performed when the port was put in. When should I seek immediate medical care? When you have an implanted port, you should seek immediate medical care if:  You notice a bad smell coming from the incision site.  You have swelling, redness, or drainage at the incision site.  You have more swelling or pain at the port site or the surrounding area.  You have a fever that is not controlled with medicine.  This information is not intended to replace advice given to you by your health care provider. Make sure you discuss any questions you have with your health care provider. Document  Released: 07/17/2005 Document Revised: 12/23/2015 Document Reviewed: 03/24/2013 Elsevier Interactive Patient Education  2017 [employer]  

## 2018-04-26 ENCOUNTER — Telehealth: Payer: Self-pay | Admitting: Hematology

## 2018-04-26 NOTE — Telephone Encounter (Signed)
Scheduled appt per 9/27 sch message - pt is aware of appt date and time.

## 2018-05-08 ENCOUNTER — Ambulatory Visit: Payer: Self-pay | Admitting: Hematology

## 2018-05-08 ENCOUNTER — Other Ambulatory Visit: Payer: Self-pay

## 2018-05-15 NOTE — Progress Notes (Signed)
Colton Bush  [phone] [phone]  Clinic Follow up Note   Patient Care Team: [name], MD as PCP - General 05/16/2018  CHIEF COMPLAINTS:  Follow up pancreatic cancer  Oncology History   Cancer Staging Adenocarcinoma of head of pancreas [facility]) Staging form: Pancreas, AJCC 7th Edition - Clinical stage from 03/09/2016: Stage IIB (T2, N1, M0) - Signed by Truitt Merle, MD on 03/17/2016 - Pathologic stage from 11/17/2016: Stage IB (yT2, N0, cM0) - Signed by Truitt Merle, MD on 12/10/2016       Adenocarcinoma of head of pancreas ([place])   02/27/2016 Imaging    CT chest, abdomen and pelvis with contrast showed ill-defined heterogeneity of pancreatic head, highly suspicious for malignancy, ) quit about and biliary ductal dilatation, mildly prominent lymph nodes in the upper abdomen, largest 2 cm in the portocaval . No other metastasis.     03/09/2016 Initial Diagnosis    Adenocarcinoma of head of pancreas ([place])    03/09/2016 Procedure    Upper EUS showed a 3.5 cm irregular mass in the pancreatic head, causing pancreatic and biliary duct obstruction. The mass involves the portal vein 422 mm, strongly suggesting invasion. There is suspicious nearby adenopathy    03/09/2016 Initial Biopsy    Fine-needle aspiration of the pancreatic mass from EUS showed malignant cells consistent with adenocarcinoma.     03/28/2016 - 05/24/2016 Chemotherapy    neoadjuvant chemo with FOLFIRINOX every 2 weeks, for 5 cycles     06/28/2016 - 08/10/2016 Radiation Therapy    Neoadjuvant radiation by Dr. [name] Site/dose:   Pancreas treated to 45 Gy in 25 fractions. The Pancreas was then boosted to 54 Gy in 5 fractions.    06/28/2016 - 08/10/2016 Chemotherapy    Xeloda 2000 mg in the morning and 1500 mg in the evening, on the day of radiation.    09/04/2016 Imaging    CT Chest Abdomen Pelvis IMPRESSION: 1. Poorly defined pancreatic head mass is grossly stable, with associated  pancreatic ductal dilatation and portacaval adenopathy. No associated vascular encasement. 2. Common bile duct stent in place with stable biliary ductal dilatation. Left hepatic lobe atrophy. 3. Hepatomegaly.  Spleen is at the upper limits normal in size. 4. Aortic atherosclerosis (ICD10-170.0). Coronary artery calcification.    09/19/2016 Surgery    Diagnostic laparoscopy and possible whipple procedure by Dr. Barry Dienes. Whipple procedure aborted due to the pancreatic cancer involving SMV, SMA and portal veins.    09/19/2016 Pathology Results    Two liver lesions were noted during diagnostic laparoscopy and biopsy revealed benign hepatic parenchyma with assoicated fibrosis with no evidence of malignancy.    10/06/2016 - 10/20/2016 Chemotherapy    Gemcitabine and Abraxane weekly, on day 1, 8, and 15 every 28 days, starting on 10/06/2016, held after1 cycle due to pending surgery at [facility]    10/31/2016 Imaging    CT CAP w contrast at [facility] 10/31/16 Impression: 1.Interval decrease in the size of the ill-defined pancreatic head mass. There is short segment abutment of the portal vein/superior mesenteric vein but no evidence of venous distortion. 2.No evidence of metastatic disease in the chest, abdomen and pelvis.    11/17/2016 -  Hospital Admission    Patient presents to hospital for nausea and vomitting    11/17/2016 Surgery    pancreatecomy, promximal subtotal with total duodenectomy, partial  GASTRECTOMY, CHOLEDOCHOENTEROSTOMY AND GASTROJEJUNOSTOMY (WHIPPLE-TYPE PROCEDURE); WITH PANCREATOJEJUNOSTOMY    01/19/2017 - 03/02/2017 Chemotherapy    Gemcitabine monotherapy: 1033m/m2, d1,8 Q21d  x2 cycles planned     05/18/2017 Imaging    CT CAP at [facility] 05/18/17 Impression: 1.Status post Whipple with soft tissue density just posterior to the SMA, which may be postsurgical, lymph node, or less likely recurrent disease. Recommend attention on follow-up 2.No evidence of metastatic disease in the chest,  abdomen, and pelvis.      09/20/2017 Imaging    CT CAP at [facility] 09/20/17 Impression: 1. Unchanged subcentimeter soft tissue nodule in the Whipple surgical bed, likely a small lymph node. 2. No evidence of metastatic disease in the chest, abdomen, or pelvis.    01/17/2018 Imaging    01/17/2018 CT CAP Impression: No evidence of recurrent or metastatic disease.     HISTORY OF PRESENTING ILLNESS:  Colton Bush 62 y.o. male is here because of His newly diagnosed pancreatic cancer. He is accompanied by his wife to our multidisciplinary GI clinic today.  He noticed fatigue, anorexia and weight loss about 50bs in the past 4 months. It started when his endocrinologist changed his diabetic meds. He presented to hospital on 7/30 with jaundice. No pain or nausea. CT scan revealed a heterogeneous pancreatic head mass, highly suspicious for malignancy. Mild biliary and pancreatic duct dilatation, mildly prominent lymph nodes in the upper abdomen. He underwent ERCP and CBD stent placement, and subsequent upper EUS on 03/09/2016, which showed a 3.5 cm irregular mass in the pancreatic head, the mass involves the portal vein, no suspicious nearby adenopathy.  He has been feeling better after stent placement, with improved appetite and energy level. He feels normal again. No symptoms. BM is good, he has gained about 8lbs back.   CURRENT THERAPY: Surveillance   INTERIM HISTORY:   Colton Bush returns for follow up of his pancreatic cancer. He was last seen by me 6 months ago. CT on in 12/2017 done at [facility] was benign. He has followed up with Dr. Elease Hashimoto in the interim. Today, she is here by himself. He plans to follow up with Dr. Mariah Milling at [facility] next month with scan.  Pt notes he is doing well, no pain and has improved energy.  He notes he has not returned to work.      MEDICAL HISTORY:  Past Medical History:  Diagnosis Date  . Arthritis    left hand  . Bronchitis 1977  . Cancer ([place]) 03/09/2016    pancreatic cancer, sees Dr. Cristino Martes at [facility]   . Depression    takes Cymbalta daily  . Diabetes mellitus type II    sees Dr. Chalmers Cater   . GERD (gastroesophageal reflux disease)    takes Omeprazole daily  . H/O hiatal hernia   . Hyperlipidemia    takes Zocor daily  . Hypertension    takes Amlodipine daily  . Hypoglycemia 06/18/2017  . Neck pain    C4-7 stenosis and herniated disc  . Neuromuscular disorder ([place])    hiatal hernia  . Scoliosis    slight  . Spinal cord injury, C5-C7 ([place])    c4-c7  . Stiffness of hand joint    d/t cervical issues    SURGICAL HISTORY: Past Surgical History:  Procedure Laterality Date  . ANTERIOR CERVICAL DECOMP/DISCECTOMY FUSION  08/18/2011   Procedure: ANTERIOR CERVICAL DECOMPRESSION/DISCECTOMY FUSION 3 LEVELS;  Surgeon: Winfield Cunas, MD;  Location: [place] [facility]UR[facility] ORS;  Service: Neurosurgery;  Laterality: N/A;  Anterior Cervical Four-Five/Five-Six/Six-Seven Decompression with Fusion, Plating, and Bonegraft  . CARPAL TUNNEL RELEASE  2013   bilateral, per Dr. Christella Noa   .  COLONOSCOPY  10-30-14   per Dr. Olevia Perches, clear, repeat in 10 yrs   . egd with esophageal dilation  9-08   per Dr. Olevia Perches  . ERCP N/A 03/01/2016   Procedure: ENDOSCOPIC RETROGRADE CHOLANGIOPANCREATOGRAPHY (ERCP) with brushings and stent;  Surgeon: Doran Stabler, MD;  Location: [facility] END[facility]COPY;  Service: Endoscopy;  Laterality: N/A;  . EUS N/A 03/09/2016   Procedure: ESOPHAGEAL ENDOSCOPIC ULTRASOUND (EUS) RADIAL;  Surgeon: Milus Banister, MD;  Location: [facility] ENDOSCOPY;  Service: Endoscopy;  Laterality: N/A;  . lymph nodes biopsy    . melanoma rt calf  1999  . PORTACATH PLACEMENT Left 03/22/2016   Procedure: INSERTION PORT-A-CATH;  Surgeon: Stark Klein, MD;  Location: [facility] ORS;  Service: General;  Laterality: Left;  . SPINE SURGERY    . TONSILLECTOMY     as a child  . ULNAR TUNNEL RELEASE  2013   right arm, per Dr. Christella Noa   . UPPER GASTROINTESTINAL ENDOSCOPY    . WHIPPLE PROCEDURE N/A  09/19/2016   Procedure: DIAGNOSTIC LAPAROSCOPY, LAPAROSCOPIC LIVER BIOPSY, RETROPERITONEAL EXPLORATION, INTRAOPERATIVE ULTRASOUND;  Surgeon: Stark Klein, MD;  Location: [place] OR;  Service: General;  Laterality: N/A;    SOCIAL HISTORY: Social History   Socioeconomic History  . Marital status: Married    Spouse name: [name]  . Number of children: Not on file  . Years of education: Not on file  . Highest education level: Not on file  Occupational History  . Occupation: Diplomatic Services operational officer  . Financial resource strain: Not on file  . Food insecurity:    Worry: Not on file    Inability: Not on file  . Transportation needs:    Medical: Not on file    Non-medical: Not on file  Tobacco Use  . Smoking status: Never Smoker  . Smokeless tobacco: Never Used  . Tobacco comment: tried a pipe 35 years ago   Substance and Sexual Activity  . Alcohol use: No    Alcohol/week: 0.0 standard drinks  . Drug use: No  . Sexual activity: Yes  Lifestyle  . Physical activity:    Days per week: Not on file    Minutes per session: Not on file  . Stress: Not on file  Relationships  . Social connections:    Talks on phone: Not on file    Gets together: Not on file    Attends religious service: Not on file    Active member of club or organization: Not on file    Attends meetings of clubs or organizations: Not on file    Relationship status: Not on file  . Intimate partner violence:    Fear of current or ex partner: Not on file    Emotionally abused: Not on file    Physically abused: Not on file    Forced sexual activity: Not on file  Other Topics Concern  . Not on file  Social History Narrative   Married, wife [name]   Armored Nutritional therapist-   He used to work as a Geophysicist/field seismologist for a company  He has one son 76 yo. Lives with his wife   FAMILY HISTORY: Family History  Problem Relation Age of Onset  . Heart disease Father   . Heart disease Brother 48  . Anesthesia problems Mother   . Heart disease  Mother   . Dementia Mother   . Diabetes Sister   . Stroke Sister   . Colon cancer Neg Hx   . Rectal cancer Neg  Hx   . Stomach cancer Neg Hx     ALLERGIES:  is allergic to no known allergies.  MEDICATIONS:  Current Outpatient Medications  Medication Sig Dispense Refill  . amLODipine (NORVASC) 10 MG tablet TAKE 1 TABLET BY MOUTH EVERY DAY 90 tablet 3  . benazepril (LOTENSIN) 10 MG tablet Take 1 tablet (10 mg total) by mouth daily. 90 tablet 3  . calcium-vitamin D (OSCAL WITH D) 500-200 MG-UNIT tablet Take 1 tablet daily with breakfast by mouth.    . CREON 36000 units CPEP capsule Take 2 capsules by mouth 3 (three) times daily before meals. Plus 1 capsule prior to snacks  3  . ferrous sulfate 325 (65 FE) MG tablet Take 325 mg daily with breakfast by mouth.    Marland Kitchen FREESTYLE LITE test strip USE ONE STRIP TO CHECK GLUCOSE ONCE DAILY. PLEASE  SCHEDULE FOLLOW UP 50 each 0  . insulin aspart (NOVOLOG) 100 UNIT/ML injection Give every morning with breakfast and every evening with supper per the following sliding scale: give 3 units for glucose 150-175, give 6 units for glucose 176-200, give 9 units for glucose 201-250, give 12 units for glucose greater than 250 3 vial 11  . Insulin Detemir (LEVEMIR) 100 UNIT/ML Pen Inject 15 Units into the skin daily at 10 pm. 90 mL 3  . Insulin Pen Needle 30G X 5 MM MISC Use one daily with insulin 100 each 2  . Lancets (FREESTYLE) lancets USE AS INSTRUCTED TO CHECK BLOOD SUGAR ONCE A DAY 100 each 2  . lidocaine-prilocaine (EMLA) cream Apply to affected area once 30 g 3  . loratadine (CLARITIN) 10 MG tablet Take 10 mg by mouth daily.    . Multiple Vitamin (MULTIVITAMIN WITH MINERALS) TABS tablet Take 1 tablet by mouth daily.    . naproxen sodium (ANAPROX) 220 MG tablet Take 440 mg by mouth 2 (two) times daily as needed (for pain.).    Marland Kitchen NOVOLOG FLEXPEN 100 UNIT/ML FlexPen GIVE EVERY MORNING WITH BREAKFAST AND EVERY EVENING WITH SUPPER PER SLIDING SCALE 3 mL 0  .  Omega-3 Fatty Acids (FISH OIL) 1200 MG CAPS Take 2,400 mg by mouth daily.    Marland Kitchen omeprazole (PRILOSEC) 20 MG capsule Take 1 capsule (20 mg total) by mouth daily. 90 capsule 3  . potassium chloride SA (KLOR-CON M20) 20 MEQ tablet Take 1 tablet (20 mEq total) by mouth daily. 90 tablet 3   No current facility-administered medications for this visit.    Facility-Administered Medications Ordered in Other Visits  Medication Dose Route Frequency Provider Last Rate Last Dose  . sodium chloride 0.9 % injection 10 mL  10 mL Intravenous PRN Truitt Merle, MD   10 mL at 02/09/17 1111    REVIEW OF SYSTEMS:  Constitutional: Denies fevers, chills or abnormal night sweats (+) good appetite and energy Eyes: Denies blurriness of vision, double vision or watery eyes Ears, nose, mouth, throat, and face: Denies mucositis or sore throat Respiratory: Denies cough, dyspnea or wheezes Cardiovascular: Denies palpitation, chest discomfort or lower extremity swelling Gastrointestinal:  Denies nausea, heartburn or change in bowel habits Skin: Denies abnormal skin rashes Lymphatics: Denies new lymphadenopathy or easy bruising Neurological:Denies numbness, tingling or new weaknesses Behavioral/Psych: Mood is stable, no new changes  All other systems were reviewed with the patient and are negative.  PHYSICAL EXAMINATION:  ECOG PERFORMANCE STATUS: 1  Vitals:   05/16/18 0935  BP: 132/77  Pulse: 68  Resp: 18  Temp: 98.1 F (36.7 C)  SpO2:  100%   Filed Weights   05/16/18 0935  Weight: 204 lb 11.2 oz (92.9 kg)    GENERAL:alert, no distress and comfortable.  SKIN: skin color, texture, turgor are normal, no rashes or significant lesions EYES: normal, conjunctiva are pink and non-injected, sclera clear OROPHARYNX:no exudate, no erythema and lips, buccal mucosa, and tongue normal  NECK: supple, thyroid normal size, non-tender, without nodularity LYMPH:  no palpable lymphadenopathy in the cervical, axillary or  inguinal LUNGS: clear to auscultation and percussion with normal breathing effort HEART: regular rate & rhythm and no murmurs and no lower extremity edema ABDOMEN: abdomen soft, non-tender and normal bowel sounds, the midline surgical scar has healed with some scar tissue, no discharge   Musculoskeletal:no cyanosis of digits and no clubbing  PSYCH: alert & oriented x 3 with fluent speech NEURO: no focal motor/sensory deficits  LABORATORY DATA:  I have reviewed the data as listed CBC Latest Ref Rng & Units 05/16/2018 12/19/2017 11/07/2017  WBC 4.0 - 10.5 K/uL 7.2 6.1 10.2  Hemoglobin 13.0 - 17.0 g/dL 11.0(L) 10.5(L) 10.9(L)  Hematocrit 39.0 - 52.0 % 35.2(L) 33.9(L) 34.2(L)  Platelets 150 - 400 K/uL 307 301 319   CMP Latest Ref Rng & Units 05/16/2018 12/19/2017 11/07/2017  Glucose 70 - 99 mg/dL 110(H) 556(HH) 159(H)  BUN 8 - 23 mg/dL 16 15 19   Creatinine 0.61 - 1.24 mg/dL 0.75 1.01 0.77  Sodium 135 - 145 mmol/L 141 135(L) 140  Potassium 3.5 - 5.1 mmol/L 3.8 4.1 3.9  Chloride 98 - 111 mmol/L 104 103 105  CO2 22 - 32 mmol/L 27 23 26   Calcium 8.9 - 10.3 mg/dL 9.5 9.4 9.5  Total Protein 6.5 - 8.1 g/dL 7.9 7.8 7.8  Total Bilirubin 0.3 - 1.2 mg/dL 0.5 0.3 0.5  Alkaline Phos 38 - 126 U/L 138(H) 134 137  AST 15 - 41 U/L 18 16 21   ALT 0 - 44 U/L 28 28 29     CA19.9 (0-35 U/ML)  02/28/2016: 1497 03/24/2016: 648 04/25/2016: 599 05/24/2016: 368 07/18/2016: 51 08/28/16: 55 10/06/16: 133 12/08/2016: 36 01/19/17: 36 02/07/17: 21 03/02/17: 26 04/20/17: 30 06/08/17: 25 11/07/17: 39 12/19/17: 43 05/16/18: PENDING    PATHOLOGY REPORT   Surgical Pathology 11/17/16 Dr. Mariah Milling  A. Head of pancreas, stomach, and duodenum, pancreatoduodenectomy (Whipple procedure) post oncologic treatment:  - Invasive moderately differentiated ductal adenocarcinoma (2.7 cm). - Perineural invasion is seen. - Twenty lymph nodes, negative for tumor (0/20). - Very minimal treatment response, if any. - Margins of resection are  negative for tumor (2.5 mm to uncinate margin, 3 mm to bile duct and pancreatic neck margins).   B. Gallbladder and contents, cholecystectomy:  Chronic cholecystitis with acute mucosal inflammation and reactive atypia.   Synoptic Report PANCREAS (EXOCRINE)(Pancreas Exo - A)     Liver, biopsy 09/19/16 - BENIGN HEPATIC PARENCHYMA WITH ASSOCIATED FIBROSIS. - THERE IS NO EVIDENCE OF MALIGNANCY.  Diagnosis 03/09/2016 FINE NEEDLE ASPIRATION, ENDOSCOPIC, PANCREAS (SPECIMEN 1 OF 1 COLLECTED 03/09/16): MALIGNANT CELLS CONSISTENT WITH ADENOCARCINOMA. BACKGROUND NECROTIC DEBRIS AND ACUTE INFLAMMATION.  Diagnosis 03/01/2016 BILE DUCT BRUSHING(SPECIMEN 1 OF 1 COLLECTED 03/01/16): RARE ATYPICAL CELLS, SEE COMMENT.    RADIOGRAPHIC STUDIES: I have personally reviewed the radiological images as listed and agreed with the findings in the report. No results found.   01/17/2018 CT CAP Impression: No evidence of recurrent or metastatic disease.  CT CAP at [facility] 09/20/17 Impression: 1. Unchanged subcentimeter soft tissue nodule in the Whipple surgical bed, likely a small lymph node. 2.  No evidence of metastatic disease in the chest, abdomen, or pelvis.  CT CAP at [facility] 05/18/17 Impression: 1.Status post Whipple with soft tissue density just posterior to the SMA, which may be postsurgical, lymph node, or less likely recurrent disease. Recommend attention on follow-up 2.No evidence of metastatic disease in the chest, abdomen, and pelvis.     CT CAP W WO CONTRAST 09/04/16 IMPRESSION: 1. Poorly defined pancreatic head mass is grossly stable, with associated pancreatic ductal dilatation and portacaval adenopathy. No associated vascular encasement. 2. Common bile duct stent in place with stable biliary ductal dilatation. Left hepatic lobe atrophy. 3. Hepatomegaly.  Spleen is at the upper limits normal in size. 4. Aortic atherosclerosis (ICD10-170.0). Coronary artery calcification.  DG CEST PORT  1 09/19/16 IMPRESSION: 1. Lines and tubes noted as above. 2. Mild bibasilar subsegmental atelectasis. 3. Cardiomegaly, no pulmonary venous congestion. 4. Free intraperitoneal air consistent with prior surgery.  EUS 03/09/2016 Dr. Ardis Hughs  - 3.5cm irregularly shaped, poorly defined mass in the pancreatic head. There is suspicious, nearby adenopathy. The mass is causing pancreatic and bililary duct obstruction (previously stented). The mass involves the portal vein for 56m (abuttment and loss of usual tissue interface), strongly suggesting invasion. Preliminary cytology results are positive for malignancy (adenocarcinoma), await final report.  ASSESSMENT & PLAN:  62y.o. Caucasian male, with past medical history of diabetes and hypertension, presented with epigastric pain, weight loss, and jaundice.  1. Primary pancreatic adenocarcinoma, in pancreatic head, cT2N1M0, stage IIB, ypT2N0M0 -I have previously reviewed his CT abdomen and pelvis with contrast, EUS and biopsy findings with patient and his wife in great details. -His case was previously reviewed in our GI tumor board a few days ago. CT scan and [place]owed possible portal vein invasion from the pancreatic tumor, this is borderline resectable disease. No evidence of distant metastasis.  -We previously reviewed the nature history of pancreatic cancer, and the overall survival rate with chemotherapy, surgery, and radiation. Patient and his wife was discouraged by the overall dismal long term survival rate (~20%)  -He received neoadjuvant chemotherapy with FOLFIRINOX and neoadjuvant chemoradiation from Aug 2017 - Jan 2018. He tolerated well  -He previously underwent diagnostic laparoscopy and possible whipple procedure on 09/19/2016 by Dr. BBarry Dienesto attempt removal of the pancreatic tumor. The tumor appeared to be encasing the SMV/portal vein confluence with blurring of the interface. The tumor also appeared to be invading the superior mesenteric  artery on the right lateral surface. At that point, the procedure was aborted. [facility], he had complete resection  -I previously reviewed his surgical pathology findings, which showed a T2 tumor, surgical margins were negative, all lymph nodes were negative. - He received additional 9 weeks of adjuvant gemcitabine in March 2018, and June - Aug 2018. -His tumor marker CA 19.9 has came down to normal, which is a good sign. No clinical concern for recurrence following chemo. -We previously discussed his 05/18/17 CT CAP from [facility] that shows no recurrence.  -I [facility] discussed [facility] also showed no signs of recurrence.  -[facility] which shows no evidence of recurrent or metastatic disease.  -I discussed his surveillance plan, he is 1.5 years s/p whipple surgery. [facility].   -He is clinically doing well. Labs reviewed, Hg at 11, CMP and Tumor Marker is still pending. Tumor  Marker has been trending up slowly, will monitor closely.  If tumor marker today increased significantly, I will consider repeat staging scan sooner. -She is scheduled to see Dr. Mariah Milling at [facility] in 6 weeks with a restaging scan. -I previously discussed signs of cancer recurrence with him. He knows what to watch for.  -He will continue to have his port flushed every 6 weeks, OK to remove if no evidence of recurrence in 1.5-2 years after surgery  -F/u in 4 months  -I offered him the flu shot today, he declined. I encouraged him to avoid those who are sick.    2. Type 2 DM - he will continue medication and follow up with his primary care physician Dr. Sarajane Jews  -We previously reviewed that his blood glucose will need to be monitored closely during the chemotherapy, and his medication may need to be adjusted  -His blood glucose had been fluctuating previously and he started to see  a diabetic specialist, he now checks his finger glucose 3 times a day, better controlled lately. -I previously advised the patient to continue watching his blood glucose levels. -I previously recommended Glucerna as a nutritional supplement due to lower amount of carbohydrate  3. HTN  -He is on amlodipine and benazepril  -we previously discussed that chemotherapy may affect his blood pressure, and he is medication may need to be adjusted -BP normal today   4. Anemia secondary to surgery and chemotherapy  -Mild, Continue monitoring, -Improved since he finished chemotherapy.  - I suggested he start OTC oral iron daily. -Hgb improved to 11 today (10/171/9) -Check ESR level on next visit.   Plan -He is clinically doing well, continue surveillance  -He will see Dr. Tenna Delaine at [facility] in 6 weeks with a restaging scan  -I offered him the flu shot todyy, he declined.  -Lab, port flush and f/u in 4 months  -Lab and port flush in 12 weeks  -I will call him with a CMP and CA 19.9 results, if CA 19.9 increased significantly, will consider restaging scan sooner   All questions were answered. The patient knows to call the clinic with any problems, questions or concerns.  I spent 20 minutes counseling the patient face to face. The total time spent in the appointment was 25 minutes and more than 50% was on counseling.   Oneal Deputy, am acting as scribe for Truitt Merle, MD.    I have reviewed the above documentation for accuracy and completeness, and I agree with the above.    Truitt Merle  05/16/2018

## 2018-05-16 ENCOUNTER — Inpatient Hospital Stay: Payer: 59 | Attending: Hematology

## 2018-05-16 ENCOUNTER — Inpatient Hospital Stay: Payer: 59

## 2018-05-16 ENCOUNTER — Encounter: Payer: Self-pay | Admitting: Hematology

## 2018-05-16 ENCOUNTER — Inpatient Hospital Stay: Payer: 59 | Admitting: Hematology

## 2018-05-16 ENCOUNTER — Telehealth: Payer: Self-pay | Admitting: Hematology

## 2018-05-16 VITALS — BP 132/77 | HR 68 | Temp 98.1°F | Resp 18 | Ht 71.0 in | Wt 204.7 lb

## 2018-05-16 DIAGNOSIS — Z923 Personal history of irradiation: Secondary | ICD-10-CM | POA: Diagnosis not present

## 2018-05-16 DIAGNOSIS — C250 Malignant neoplasm of head of pancreas: Secondary | ICD-10-CM

## 2018-05-16 DIAGNOSIS — R634 Abnormal weight loss: Secondary | ICD-10-CM

## 2018-05-16 DIAGNOSIS — I700 Atherosclerosis of aorta: Secondary | ICD-10-CM

## 2018-05-16 DIAGNOSIS — R630 Anorexia: Secondary | ICD-10-CM

## 2018-05-16 DIAGNOSIS — E785 Hyperlipidemia, unspecified: Secondary | ICD-10-CM | POA: Insufficient documentation

## 2018-05-16 DIAGNOSIS — K219 Gastro-esophageal reflux disease without esophagitis: Secondary | ICD-10-CM | POA: Diagnosis not present

## 2018-05-16 DIAGNOSIS — R160 Hepatomegaly, not elsewhere classified: Secondary | ICD-10-CM

## 2018-05-16 DIAGNOSIS — Z79899 Other long term (current) drug therapy: Secondary | ICD-10-CM | POA: Insufficient documentation

## 2018-05-16 DIAGNOSIS — R5383 Other fatigue: Secondary | ICD-10-CM | POA: Diagnosis not present

## 2018-05-16 DIAGNOSIS — Z903 Acquired absence of stomach [part of]: Secondary | ICD-10-CM | POA: Diagnosis not present

## 2018-05-16 DIAGNOSIS — T451X5S Adverse effect of antineoplastic and immunosuppressive drugs, sequela: Secondary | ICD-10-CM | POA: Diagnosis not present

## 2018-05-16 DIAGNOSIS — Z794 Long term (current) use of insulin: Secondary | ICD-10-CM | POA: Insufficient documentation

## 2018-05-16 DIAGNOSIS — Z95828 Presence of other vascular implants and grafts: Secondary | ICD-10-CM

## 2018-05-16 DIAGNOSIS — I10 Essential (primary) hypertension: Secondary | ICD-10-CM

## 2018-05-16 DIAGNOSIS — I2510 Atherosclerotic heart disease of native coronary artery without angina pectoris: Secondary | ICD-10-CM

## 2018-05-16 DIAGNOSIS — E119 Type 2 diabetes mellitus without complications: Secondary | ICD-10-CM | POA: Diagnosis not present

## 2018-05-16 DIAGNOSIS — D6481 Anemia due to antineoplastic chemotherapy: Secondary | ICD-10-CM | POA: Diagnosis not present

## 2018-05-16 DIAGNOSIS — Z9221 Personal history of antineoplastic chemotherapy: Secondary | ICD-10-CM

## 2018-05-16 DIAGNOSIS — IMO0001 Reserved for inherently not codable concepts without codable children: Secondary | ICD-10-CM

## 2018-05-16 DIAGNOSIS — E1165 Type 2 diabetes mellitus with hyperglycemia: Secondary | ICD-10-CM

## 2018-05-16 LAB — CBC WITH DIFFERENTIAL/PLATELET
Abs Immature Granulocytes: 0.01 10*3/uL (ref 0.00–0.07)
Basophils Absolute: 0.1 10*3/uL (ref 0.0–0.1)
Basophils Relative: 1 %
Eosinophils Absolute: 0.2 10*3/uL (ref 0.0–0.5)
Eosinophils Relative: 3 %
HCT: 35.2 % — ABNORMAL LOW (ref 39.0–52.0)
Hemoglobin: 11 g/dL — ABNORMAL LOW (ref 13.0–17.0)
Immature Granulocytes: 0 %
Lymphocytes Relative: 21 %
Lymphs Abs: 1.5 10*3/uL (ref 0.7–4.0)
MCH: 24.4 pg — ABNORMAL LOW (ref 26.0–34.0)
MCHC: 31.3 g/dL (ref 30.0–36.0)
MCV: 78 fL — ABNORMAL LOW (ref 80.0–100.0)
Monocytes Absolute: 0.6 10*3/uL (ref 0.1–1.0)
Monocytes Relative: 9 %
Neutro Abs: 4.8 10*3/uL (ref 1.7–7.7)
Neutrophils Relative %: 66 %
Platelets: 307 10*3/uL (ref 150–400)
RBC: 4.51 MIL/uL (ref 4.22–5.81)
RDW: 15.7 % — ABNORMAL HIGH (ref 11.5–15.5)
WBC: 7.2 10*3/uL (ref 4.0–10.5)
nRBC: 0 % (ref 0.0–0.2)

## 2018-05-16 LAB — COMPREHENSIVE METABOLIC PANEL
ALT: 28 U/L (ref 0–44)
AST: 18 U/L (ref 15–41)
Albumin: 3.9 g/dL (ref 3.5–5.0)
Alkaline Phosphatase: 138 U/L — ABNORMAL HIGH (ref 38–126)
Anion gap: 10 (ref 5–15)
BUN: 16 mg/dL (ref 8–23)
CO2: 27 mmol/L (ref 22–32)
Calcium: 9.5 mg/dL (ref 8.9–10.3)
Chloride: 104 mmol/L (ref 98–111)
Creatinine, Ser: 0.75 mg/dL (ref 0.61–1.24)
GFR calc Af Amer: >60 mL/min (ref >60)
GFR calc non Af Amer: >60 mL/min (ref >60)
Glucose, Bld: 110 mg/dL — ABNORMAL HIGH (ref 70–99)
Potassium: 3.8 mmol/L (ref 3.5–5.1)
Sodium: 141 mmol/L (ref 135–145)
Total Bilirubin: 0.5 mg/dL (ref 0.3–1.2)
Total Protein: 7.9 g/dL (ref 6.5–8.1)

## 2018-05-16 MED ORDER — SODIUM CHLORIDE 0.9 % IJ SOLN
10.0000 mL | INTRAMUSCULAR | Status: DC | PRN
  Administered 2018-05-16: 10 mL via INTRAVENOUS
  Filled 2018-05-16: qty 10

## 2018-05-16 MED ORDER — HEPARIN SOD (PORK) LOCK FLUSH 100 UNIT/ML IV SOLN
500.0000 [IU] | Freq: Once | INTRAVENOUS | Status: AC | PRN
  Administered 2018-05-16: 500 [IU] via INTRAVENOUS
  Filled 2018-05-16: qty 5

## 2018-05-16 NOTE — Telephone Encounter (Signed)
Scheduled appt per 10/17 los - gave patient aVS and calender per los.   

## 2018-05-17 LAB — CANCER ANTIGEN 19-9: CA 19-9: 31 U/mL (ref 0–35)

## 2018-05-20 ENCOUNTER — Telehealth: Payer: Self-pay

## 2018-05-20 NOTE — Telephone Encounter (Signed)
-----   Message from Truitt Merle, MD sent at 05/18/2018  7:21 PM EDT ----- Please let pt know the lab results, tumor marker CA19.9 normal now, which is good news. Mildly anemic, I recommend MVI, no other concerns, thanks  Truitt Merle  05/18/2018

## 2018-05-20 NOTE — Telephone Encounter (Signed)
Left voice message per Dr. [name], lab results showed tumor marker CA19.19 is normal now, which is good news.  Mildly anemic, instructed to take a MVI daily, no other concerns.

## 2018-08-08 ENCOUNTER — Inpatient Hospital Stay: Payer: Medicare Other | Attending: Hematology

## 2018-08-08 ENCOUNTER — Inpatient Hospital Stay: Payer: Medicare Other

## 2018-08-08 DIAGNOSIS — Z9221 Personal history of antineoplastic chemotherapy: Secondary | ICD-10-CM | POA: Diagnosis not present

## 2018-08-08 DIAGNOSIS — Z923 Personal history of irradiation: Secondary | ICD-10-CM | POA: Diagnosis not present

## 2018-08-08 DIAGNOSIS — C250 Malignant neoplasm of head of pancreas: Secondary | ICD-10-CM | POA: Insufficient documentation

## 2018-08-08 DIAGNOSIS — Z903 Acquired absence of stomach [part of]: Secondary | ICD-10-CM | POA: Diagnosis not present

## 2018-08-08 DIAGNOSIS — Z95828 Presence of other vascular implants and grafts: Secondary | ICD-10-CM

## 2018-08-08 LAB — CBC WITH DIFFERENTIAL/PLATELET
Abs Immature Granulocytes: 0.04 10*3/uL (ref 0.00–0.07)
Basophils Absolute: 0.1 10*3/uL (ref 0.0–0.1)
Basophils Relative: 1 %
Eosinophils Absolute: 0.1 10*3/uL (ref 0.0–0.5)
Eosinophils Relative: 1 %
HCT: 39.6 % (ref 39.0–52.0)
Hemoglobin: 12.7 g/dL — ABNORMAL LOW (ref 13.0–17.0)
Immature Granulocytes: 1 %
Lymphocytes Relative: 18 %
Lymphs Abs: 1.5 10*3/uL (ref 0.7–4.0)
MCH: 26.8 pg (ref 26.0–34.0)
MCHC: 32.1 g/dL (ref 30.0–36.0)
MCV: 83.5 fL (ref 80.0–100.0)
Monocytes Absolute: 0.6 10*3/uL (ref 0.1–1.0)
Monocytes Relative: 8 %
Neutro Abs: 5.9 10*3/uL (ref 1.7–7.7)
Neutrophils Relative %: 71 %
Platelets: 300 10*3/uL (ref 150–400)
RBC: 4.74 MIL/uL (ref 4.22–5.81)
RDW: 16.6 % — ABNORMAL HIGH (ref 11.5–15.5)
WBC: 8.3 10*3/uL (ref 4.0–10.5)
nRBC: 0 % (ref 0.0–0.2)

## 2018-08-08 LAB — COMPREHENSIVE METABOLIC PANEL
ALT: 31 U/L (ref 0–44)
AST: 21 U/L (ref 15–41)
Albumin: 3.9 g/dL (ref 3.5–5.0)
Alkaline Phosphatase: 133 U/L — ABNORMAL HIGH (ref 38–126)
Anion gap: 10 (ref 5–15)
BUN: 15 mg/dL (ref 8–23)
CO2: 26 mmol/L (ref 22–32)
Calcium: 9.2 mg/dL (ref 8.9–10.3)
Chloride: 106 mmol/L (ref 98–111)
Creatinine, Ser: 0.73 mg/dL (ref 0.61–1.24)
GFR calc Af Amer: >60 mL/min (ref >60)
GFR calc non Af Amer: >60 mL/min (ref >60)
Glucose, Bld: 116 mg/dL — ABNORMAL HIGH (ref 70–99)
Potassium: 3.7 mmol/L (ref 3.5–5.1)
Sodium: 142 mmol/L (ref 135–145)
Total Bilirubin: 0.4 mg/dL (ref 0.3–1.2)
Total Protein: 7.7 g/dL (ref 6.5–8.1)

## 2018-08-08 MED ORDER — HEPARIN SOD (PORK) LOCK FLUSH 100 UNIT/ML IV SOLN
500.0000 [IU] | Freq: Once | INTRAVENOUS | Status: AC | PRN
  Administered 2018-08-08: 500 [IU] via INTRAVENOUS
  Filled 2018-08-08: qty 5

## 2018-08-08 MED ORDER — ALTEPLASE 2 MG IJ SOLR
2.0000 mg | Freq: Once | INTRAMUSCULAR | Status: DC | PRN
  Filled 2018-08-08: qty 2

## 2018-08-09 LAB — CANCER ANTIGEN 19-9: CA 19-9: 34 U/mL (ref 0–35)

## 2018-08-13 ENCOUNTER — Telehealth: Payer: Self-pay

## 2018-08-13 NOTE — Telephone Encounter (Signed)
Left voice message for patient regarding lab results.  Per Dr. [name] all WNL, no concerns.

## 2018-08-13 NOTE — Telephone Encounter (Signed)
-----   Message from Truitt Merle, MD sent at 08/11/2018  8:00 PM EST ----- Please let pt know his lab results, all WNL, no concerns, thanks   Truitt Merle  08/11/2018

## 2018-09-13 ENCOUNTER — Other Ambulatory Visit: Payer: Self-pay | Admitting: Family Medicine

## 2018-09-13 DIAGNOSIS — C250 Malignant neoplasm of head of pancreas: Secondary | ICD-10-CM

## 2018-09-13 NOTE — Progress Notes (Signed)
[place]   Telephone:(336) [phone] Fax:(336) [phone]   Clinic Follow up Note   Patient Care Team: [name], MD as PCP - General  Date of Service:  09/16/2018  CHIEF COMPLAINT: Follow up pancreatic cancer  SUMMARY OF ONCOLOGIC HISTORY: Oncology History   Cancer Staging Adenocarcinoma of head of pancreas [facility]) Staging form: Pancreas, AJCC 7th Edition - Clinical stage from 03/09/2016: Stage IIB (T2, N1, M0) - Signed by Truitt Merle, MD on 03/17/2016 - Pathologic stage from 11/17/2016: Stage IB (yT2, N0, cM0) - Signed by Truitt Merle, MD on 12/10/2016       Adenocarcinoma of head of pancreas ([place])   02/27/2016 Imaging    CT chest, abdomen and pelvis with contrast showed ill-defined heterogeneity of pancreatic head, highly suspicious for malignancy, ) quit about and biliary ductal dilatation, mildly prominent lymph nodes in the upper abdomen, largest 2 cm in the portocaval . No other metastasis.     03/09/2016 Initial Diagnosis    Adenocarcinoma of head of pancreas ([place])    03/09/2016 Procedure    Upper EUS showed a 3.5 cm irregular mass in the pancreatic head, causing pancreatic and biliary duct obstruction. The mass involves the portal vein 422 mm, strongly suggesting invasion. There is suspicious nearby adenopathy    03/09/2016 Initial Biopsy    Fine-needle aspiration of the pancreatic mass from EUS showed malignant cells consistent with adenocarcinoma.     03/28/2016 - 05/24/2016 Chemotherapy    neoadjuvant chemo with FOLFIRINOX every 2 weeks, for 5 cycles     06/28/2016 - 08/10/2016 Radiation Therapy    Neoadjuvant radiation by Dr. [name] Site/dose:   Pancreas treated to 45 Gy in 25 fractions. The Pancreas was then boosted to 54 Gy in 5 fractions.    06/28/2016 - 08/10/2016 Chemotherapy    Xeloda 2000 mg in the morning and 1500 mg in the evening, on the day of radiation.    09/04/2016 Imaging    CT Chest Abdomen Pelvis IMPRESSION: 1. Poorly defined pancreatic  head mass is grossly stable, with associated pancreatic ductal dilatation and portacaval adenopathy. No associated vascular encasement. 2. Common bile duct stent in place with stable biliary ductal dilatation. Left hepatic lobe atrophy. 3. Hepatomegaly.  Spleen is at the upper limits normal in size. 4. Aortic atherosclerosis (ICD10-170.0). Coronary artery calcification.    09/19/2016 Surgery    Diagnostic laparoscopy and possible whipple procedure by Dr. Barry Dienes. Whipple procedure aborted due to the pancreatic cancer involving SMV, SMA and portal veins.    09/19/2016 Pathology Results    Two liver lesions were noted during diagnostic laparoscopy and biopsy revealed benign hepatic parenchyma with assoicated fibrosis with no evidence of malignancy.    10/06/2016 - 10/20/2016 Chemotherapy    Gemcitabine and Abraxane weekly, on day 1, 8, and 15 every 28 days, starting on 10/06/2016, held after1 cycle due to pending surgery at [facility]    10/31/2016 Imaging    CT CAP w contrast at [facility] 10/31/16 Impression: 1.Interval decrease in the size of the ill-defined pancreatic head mass. There is short segment abutment of the portal vein/superior mesenteric vein but no evidence of venous distortion. 2.No evidence of metastatic disease in the chest, abdomen and pelvis.    11/17/2016 -  Hospital Admission    Patient presents to hospital for nausea and vomitting    11/17/2016 Surgery    pancreatecomy, promximal subtotal with total duodenectomy, partial  GASTRECTOMY, CHOLEDOCHOENTEROSTOMY AND GASTROJEJUNOSTOMY (WHIPPLE-TYPE PROCEDURE); WITH PANCREATOJEJUNOSTOMY    01/19/2017 -  03/02/2017 Chemotherapy    Gemcitabine monotherapy: 1000mg /m2, d1,8 Q21d x2 cycles planned     05/18/2017 Imaging    CT CAP at [facility] 05/18/17 Impression: 1.Status post Whipple with soft tissue density just posterior to the SMA, which may be postsurgical, lymph node, or less likely recurrent disease. Recommend attention on  follow-up 2.No evidence of metastatic disease in the chest, abdomen, and pelvis.      09/20/2017 Imaging    CT CAP at [facility] 09/20/17 Impression: 1. Unchanged subcentimeter soft tissue nodule in the Whipple surgical bed, likely a small lymph node. 2. No evidence of metastatic disease in the chest, abdomen, or pelvis.    01/17/2018 Imaging    01/17/2018 CT CAP Impression: No evidence of recurrent or metastatic disease.    06/13/2018 Imaging    CT CAP W Contrast at [place] on 06/13/18 Impression:  1. Status post Whipple procedure with stable findings at the surgical bed. No convincing evidence of recurrent or metastatic disease within the chest, abdomen, or pelvis.  Electronically Reviewed by:  Gaye Pollack, MD, [facility] Electronically Reviewed on:  06/13/2018 12:15 PM  I have reviewed the images and concur with the above findings.      CURRENT THERAPY:  Surveillance   INTERVAL HISTORY:  Colton Bush is here for a follow up of pancreatic cancer. He presents to the clinic today by himself. He notes he is doing well. He has normal bowel movement, appetite and weight gain. He notes his energy level is normal. He notes he did not return to work as they did not reach out to him. He notes he is drawing up papers for social security. He notes he follows up with Dr. Alwyn Pea office and will see them again in 4-11/2018 and will do a scan there.      REVIEW OF SYSTEMS:   Constitutional: Denies fevers, chills or abnormal weight loss Eyes: Denies blurriness of vision Ears, nose, mouth, throat, and face: Denies mucositis or sore throat Respiratory: Denies cough, dyspnea or wheezes Cardiovascular: Denies palpitation, chest discomfort or lower extremity swelling Gastrointestinal:  Denies nausea, heartburn or change in bowel habits Skin: Denies abnormal skin rashes Lymphatics: Denies new lymphadenopathy or easy bruising Neurological:Denies numbness, tingling or new  weaknesses Behavioral/Psych: Mood is stable, no new changes  All other systems were reviewed with the patient and are negative.  MEDICAL HISTORY:  Past Medical History:  Diagnosis Date  . Arthritis    left hand  . Bronchitis 1977  . Cancer ([place]) 03/09/2016   pancreatic cancer, sees Dr. Cristino Martes at [facility]   . Depression    takes Cymbalta daily  . Diabetes mellitus type II    sees Dr. Chalmers Cater   . GERD (gastroesophageal reflux disease)    takes Omeprazole daily  . H/O hiatal hernia   . Hyperlipidemia    takes Zocor daily  . Hypertension    takes Amlodipine daily  . Hypoglycemia 06/18/2017  . Neck pain    C4-7 stenosis and herniated disc  . Neuromuscular disorder ([place])    hiatal hernia  . Scoliosis    slight  . Spinal cord injury, C5-C7 ([place])    c4-c7  . Stiffness of hand joint    d/t cervical issues    SURGICAL HISTORY: Past Surgical History:  Procedure Laterality Date  . ANTERIOR CERVICAL DECOMP/DISCECTOMY FUSION  08/18/2011   Procedure: ANTERIOR CERVICAL DECOMPRESSION/DISCECTOMY FUSION 3 LEVELS;  Surgeon: Winfield Cunas, MD;  Location: [facility];  Service: Neurosurgery;  Laterality: N/A;  Anterior Cervical Four-Five/Five-Six/Six-Seven Decompression with Fusion, Plating, and Bonegraft  . CARPAL TUNNEL RELEASE  2013   bilateral, per Dr. Christella Noa   . COLONOSCOPY  10-30-14   per Dr. Olevia Perches, clear, repeat in 10 yrs   . egd with esophageal dilation  9-08   per Dr. Olevia Perches  . ERCP N/A 03/01/2016   Procedure: ENDOSCOPIC RETROGRADE CHOLANGIOPANCREATOGRAPHY (ERCP) with brushings and stent;  Surgeon: Doran Stabler, MD;  Location: [facility];  Service: Endoscopy;  Laterality: N/A;  . EUS N/A 03/09/2016   Procedure: ESOPHAGEAL ENDOSCOPIC ULTRASOUND (EUS) RADIAL;  Surgeon: Milus Banister, MD;  Location: [facility];  Service: Endoscopy;  Laterality: N/A;  . lymph nodes biopsy    . melanoma rt calf  1999  . PORTACATH PLACEMENT Left 03/22/2016   Procedure: INSERTION PORT-A-CATH;   Surgeon: Stark Klein, MD;  Location: [facility];  Service: General;  Laterality: Left;  . SPINE SURGERY    . TONSILLECTOMY     as a child  . ULNAR TUNNEL RELEASE  2013   right arm, per Dr. Christella Noa   . UPPER GASTROINTESTINAL ENDOSCOPY    . WHIPPLE PROCEDURE N/A 09/19/2016   Procedure: DIAGNOSTIC LAPAROSCOPY, LAPAROSCOPIC LIVER BIOPSY, RETROPERITONEAL EXPLORATION, INTRAOPERATIVE ULTRASOUND;  Surgeon: Stark Klein, MD;  Location: [facility];  Service: General;  Laterality: N/A;    I have reviewed the social history and family history with the patient and they are unchanged from previous note.  ALLERGIES:  is allergic to no known allergies.  MEDICATIONS:  Current Outpatient Medications  Medication Sig Dispense Refill  . amLODipine (NORVASC) 10 MG tablet TAKE 1 TABLET BY MOUTH EVERY DAY 90 tablet 3  . benazepril (LOTENSIN) 10 MG tablet Take 1 tablet (10 mg total) by mouth daily. 90 tablet 3  . calcium-vitamin D (OSCAL WITH D) 500-200 MG-UNIT tablet Take 1 tablet daily with breakfast by mouth.    . CREON 36000 units CPEP capsule Take 2 capsules by mouth 3 (three) times daily before meals. Plus 1 capsule prior to snacks  3  . ferrous sulfate 325 (65 FE) MG tablet Take 325 mg daily with breakfast by mouth.    Marland Kitchen FREESTYLE LITE test strip USE ONE STRIP TO CHECK GLUCOSE ONCE DAILY. PLEASE  SCHEDULE FOLLOW UP 50 each 0  . insulin aspart (NOVOLOG) 100 UNIT/ML injection Give every morning with breakfast and every evening with supper per the following sliding scale: give 3 units for glucose 150-175, give 6 units for glucose 176-200, give 9 units for glucose 201-250, give 12 units for glucose greater than 250 3 vial 11  . Insulin Detemir (LEVEMIR) 100 UNIT/ML Pen Inject 15 Units into the skin daily at 10 pm. 90 mL 3  . Insulin Pen Needle 30G X 5 MM MISC Use one daily with insulin 100 each 2  . KLOR-CON M20 20 MEQ tablet TAKE 1 TABLET BY MOUTH EVERY DAY 90 tablet 0  . Lancets (FREESTYLE) lancets USE AS INSTRUCTED  TO CHECK BLOOD SUGAR ONCE A DAY 100 each 2  . lidocaine-prilocaine (EMLA) cream Apply to affected area once 30 g 3  . loratadine (CLARITIN) 10 MG tablet Take 10 mg by mouth daily.    . Multiple Vitamin (MULTIVITAMIN WITH MINERALS) TABS tablet Take 1 tablet by mouth daily.    . naproxen sodium (ANAPROX) 220 MG tablet Take 440 mg by mouth 2 (two) times daily as needed (for pain.).    Marland Kitchen NOVOLOG FLEXPEN 100 UNIT/ML FlexPen GIVE EVERY MORNING WITH  BREAKFAST AND EVERY EVENING WITH SUPPER PER SLIDING SCALE 3 mL 0  . Omega-3 Fatty Acids (FISH OIL) 1200 MG CAPS Take 2,400 mg by mouth daily.    Marland Kitchen omeprazole (PRILOSEC) 20 MG capsule TAKE 1 CAPSULE BY MOUTH EVERY DAY 90 capsule 0   No current facility-administered medications for this visit.    Facility-Administered Medications Ordered in Other Visits  Medication Dose Route Frequency Provider Last Rate Last Dose  . sodium chloride 0.9 % injection 10 mL  10 mL Intravenous PRN Truitt Merle, MD   10 mL at 02/09/17 1111    PHYSICAL EXAMINATION: ECOG PERFORMANCE STATUS: 0 - Asymptomatic  Vitals:   09/16/18 0952  BP: 126/76  Pulse: 78  Resp: 18  Temp: 98.9 F (37.2 C)  SpO2: 98%   Filed Weights   09/16/18 0952  Weight: 212 lb 12.8 oz (96.5 kg)    GENERAL:alert, no distress and comfortable SKIN: skin color, texture, turgor are normal, no rashes or significant lesions EYES: normal, Conjunctiva are pink and non-injected, sclera clear OROPHARYNX:no exudate, no erythema and lips, buccal mucosa, and tongue normal  NECK: supple, thyroid normal size, non-tender, without nodularity LYMPH:  no palpable lymphadenopathy in the cervical, axillary or inguinal LUNGS: clear to auscultation and percussion with normal breathing effort HEART: regular rate & rhythm and no murmurs and no lower extremity edema ABDOMEN:abdomen soft, non-tender and normal bowel sounds Musculoskeletal:no cyanosis of digits and no clubbing  NEURO: alert & oriented x 3 with fluent  speech, no focal motor/sensory deficits  LABORATORY DATA:  I have reviewed the data as listed CBC Latest Ref Rng & Units 09/16/2018 08/08/2018 05/16/2018  WBC 4.0 - 10.5 K/uL 9.2 8.3 7.2  Hemoglobin 13.0 - 17.0 g/dL 13.2 12.7(L) 11.0(L)  Hematocrit 39.0 - 52.0 % 40.5 39.6 35.2(L)  Platelets 150 - 400 K/uL 242 300 307     CMP Latest Ref Rng & Units 09/16/2018 08/08/2018 05/16/2018  Glucose 70 - 99 mg/dL 94 116(H) 110(H)  BUN 8 - 23 mg/dL 14 15 16   Creatinine 0.61 - 1.24 mg/dL 0.76 0.73 0.75  Sodium 135 - 145 mmol/L 141 142 141  Potassium 3.5 - 5.1 mmol/L 3.7 3.7 3.8  Chloride 98 - 111 mmol/L 108 106 104  CO2 22 - 32 mmol/L 25 26 27   Calcium 8.9 - 10.3 mg/dL 9.2 9.2 9.5  Total Protein 6.5 - 8.1 g/dL 7.9 7.7 7.9  Total Bilirubin 0.3 - 1.2 mg/dL 0.5 0.4 0.5  Alkaline Phos 38 - 126 U/L 132(H) 133(H) 138(H)  AST 15 - 41 U/L 16 21 18   ALT 0 - 44 U/L 26 31 28       RADIOGRAPHIC STUDIES: I have personally reviewed the radiological images as listed and agreed with the findings in the report. No results found.   ASSESSMENT & PLAN:  Colton Bush is a 63 y.o. male with    1. Primary pancreatic adenocarcinoma, in pancreatic head, cT2N1M0, stage IIB, ypT2N0M0 -He was diagnosed in 02/2016. He is s/p neoadjuvant FOLFIRINOX, concurrent chemoRT with Xeloda, Whipple surgery and adjuvant Gem/Abraxane.  -We discussed his surveillance CT from 06/14/19 which shows no evidence of recurrence.  -He is almost 3 years since diagnosis. I discussed up to 2-3 years NED he risk of recurrence is much less.  -He can proceed with port removal by Dr. Barry Dienes after next scan with Dr. Mariah Milling. He is agreeable. For now will continue port flushes every 6 weeks.  -He is clinically doing well. Labs revewied, CBC WNL,  CMP WNL, CA 19.9 still pending. Physical exam unremarkable. There is no clinical concern for recurrence. -I discussed after 2 years of his surgery, his risk of recurrence will decrease significantly.  Patient  would like to remove his port after neck scan, he will call me and I will arrange -Continue surveillance, f/u in 6 months and f/u with Dr. Mariah Milling in the interim.    2. Type 2 DM -He will continue medication and follow up with his primary care physician Dr. Sarajane Jews    3. HTN  -He is on amlodipine and benazepril  -BP normal lately      Plan -He is clinically doing well, continue surveillance  -Lab and f/u in 6 months  -Port flush in 6 weeks  -If he is next surveillance CT scan  at [facility] in 3 months shows no evidence of recurrence, he will call me, and I will contact Dr. Barry Dienes to remove his port.     No problem-specific Assessment & Plan notes found for this encounter.   No orders of the defined types were placed in this encounter.  All questions were answered. The patient knows to call the clinic with any problems, questions or concerns. No barriers to learning was detected. I spent 15 minutes counseling the patient face to face. The total time spent in the appointment was 20 minutes and more than 50% was on counseling and review of test results     Truitt Merle, MD 09/16/2018   I, Joslyn Devon, am acting as scribe for Truitt Merle, MD.   I have reviewed the above documentation for accuracy and completeness, and I agree with the above.

## 2018-09-16 ENCOUNTER — Encounter: Payer: Self-pay | Admitting: Hematology

## 2018-09-16 ENCOUNTER — Inpatient Hospital Stay: Payer: Medicare Other

## 2018-09-16 ENCOUNTER — Telehealth: Payer: Self-pay | Admitting: Hematology

## 2018-09-16 ENCOUNTER — Inpatient Hospital Stay: Payer: Medicare Other | Attending: Hematology | Admitting: Hematology

## 2018-09-16 VITALS — BP 126/76 | HR 78 | Temp 98.9°F | Resp 18 | Ht 71.0 in | Wt 212.8 lb

## 2018-09-16 DIAGNOSIS — Z794 Long term (current) use of insulin: Secondary | ICD-10-CM | POA: Diagnosis not present

## 2018-09-16 DIAGNOSIS — Z79899 Other long term (current) drug therapy: Secondary | ICD-10-CM | POA: Diagnosis not present

## 2018-09-16 DIAGNOSIS — E785 Hyperlipidemia, unspecified: Secondary | ICD-10-CM | POA: Insufficient documentation

## 2018-09-16 DIAGNOSIS — E119 Type 2 diabetes mellitus without complications: Secondary | ICD-10-CM | POA: Diagnosis not present

## 2018-09-16 DIAGNOSIS — I2510 Atherosclerotic heart disease of native coronary artery without angina pectoris: Secondary | ICD-10-CM | POA: Diagnosis not present

## 2018-09-16 DIAGNOSIS — K219 Gastro-esophageal reflux disease without esophagitis: Secondary | ICD-10-CM

## 2018-09-16 DIAGNOSIS — Z452 Encounter for adjustment and management of vascular access device: Secondary | ICD-10-CM | POA: Insufficient documentation

## 2018-09-16 DIAGNOSIS — M1990 Unspecified osteoarthritis, unspecified site: Secondary | ICD-10-CM

## 2018-09-16 DIAGNOSIS — Z95828 Presence of other vascular implants and grafts: Secondary | ICD-10-CM

## 2018-09-16 DIAGNOSIS — C250 Malignant neoplasm of head of pancreas: Secondary | ICD-10-CM

## 2018-09-16 DIAGNOSIS — Z923 Personal history of irradiation: Secondary | ICD-10-CM | POA: Diagnosis not present

## 2018-09-16 DIAGNOSIS — E1165 Type 2 diabetes mellitus with hyperglycemia: Secondary | ICD-10-CM

## 2018-09-16 DIAGNOSIS — I10 Essential (primary) hypertension: Secondary | ICD-10-CM | POA: Diagnosis not present

## 2018-09-16 DIAGNOSIS — Z9221 Personal history of antineoplastic chemotherapy: Secondary | ICD-10-CM | POA: Diagnosis not present

## 2018-09-16 DIAGNOSIS — IMO0001 Reserved for inherently not codable concepts without codable children: Secondary | ICD-10-CM

## 2018-09-16 DIAGNOSIS — I700 Atherosclerosis of aorta: Secondary | ICD-10-CM | POA: Insufficient documentation

## 2018-09-16 DIAGNOSIS — R160 Hepatomegaly, not elsewhere classified: Secondary | ICD-10-CM | POA: Diagnosis not present

## 2018-09-16 LAB — COMPREHENSIVE METABOLIC PANEL
ALT: 26 U/L (ref 0–44)
AST: 16 U/L (ref 15–41)
Albumin: 3.9 g/dL (ref 3.5–5.0)
Alkaline Phosphatase: 132 U/L — ABNORMAL HIGH (ref 38–126)
Anion gap: 8 (ref 5–15)
BUN: 14 mg/dL (ref 8–23)
CO2: 25 mmol/L (ref 22–32)
Calcium: 9.2 mg/dL (ref 8.9–10.3)
Chloride: 108 mmol/L (ref 98–111)
Creatinine, Ser: 0.76 mg/dL (ref 0.61–1.24)
GFR calc Af Amer: >60 mL/min (ref >60)
GFR calc non Af Amer: >60 mL/min (ref >60)
Glucose, Bld: 94 mg/dL (ref 70–99)
Potassium: 3.7 mmol/L (ref 3.5–5.1)
Sodium: 141 mmol/L (ref 135–145)
Total Bilirubin: 0.5 mg/dL (ref 0.3–1.2)
Total Protein: 7.9 g/dL (ref 6.5–8.1)

## 2018-09-16 LAB — CBC WITH DIFFERENTIAL/PLATELET
Abs Immature Granulocytes: 0.03 10*3/uL (ref 0.00–0.07)
Basophils Absolute: 0.1 10*3/uL (ref 0.0–0.1)
Basophils Relative: 1 %
Eosinophils Absolute: 0.2 10*3/uL (ref 0.0–0.5)
Eosinophils Relative: 2 %
HCT: 40.5 % (ref 39.0–52.0)
Hemoglobin: 13.2 g/dL (ref 13.0–17.0)
Immature Granulocytes: 0 %
Lymphocytes Relative: 15 %
Lymphs Abs: 1.4 10*3/uL (ref 0.7–4.0)
MCH: 27.7 pg (ref 26.0–34.0)
MCHC: 32.6 g/dL (ref 30.0–36.0)
MCV: 85.1 fL (ref 80.0–100.0)
Monocytes Absolute: 1.1 10*3/uL — ABNORMAL HIGH (ref 0.1–1.0)
Monocytes Relative: 12 %
Neutro Abs: 6.4 10*3/uL (ref 1.7–7.7)
Neutrophils Relative %: 70 %
Platelets: 242 10*3/uL (ref 150–400)
RBC: 4.76 MIL/uL (ref 4.22–5.81)
RDW: 15 % (ref 11.5–15.5)
WBC: 9.2 10*3/uL (ref 4.0–10.5)
nRBC: 0 % (ref 0.0–0.2)

## 2018-09-16 MED ORDER — HEPARIN SOD (PORK) LOCK FLUSH 100 UNIT/ML IV SOLN
500.0000 [IU] | Freq: Once | INTRAVENOUS | Status: AC | PRN
  Administered 2018-09-16: 500 [IU] via INTRAVENOUS
  Filled 2018-09-16: qty 5

## 2018-09-16 NOTE — Telephone Encounter (Signed)
Scheduled appt per 02/17 los. Printed calendar and avs.  °

## 2018-09-16 NOTE — Patient Instructions (Signed)

## 2018-09-17 LAB — CANCER ANTIGEN 19-9: CA 19-9: 43 U/mL — ABNORMAL HIGH (ref 0–35)

## 2018-09-18 ENCOUNTER — Telehealth: Payer: Self-pay

## 2018-09-18 NOTE — Telephone Encounter (Signed)
-----   Message from Truitt Merle, MD sent at 09/18/2018  6:58 AM EST ----- Please let pt know that his tumor marker was slightly elevated on last office visit, and I recommend a repeated lab in a month (please schedule). He had similarly elevated CA19.9 before also, so I am not very concerned, but it needs to be followed. Thanks   Truitt Merle  09/18/2018

## 2018-09-18 NOTE — Telephone Encounter (Signed)
Spoke with patientregarding lab results. Per Dr. [name] CA 19.9 is slightly elevated, would like to repeat this in one month, he is in agreement with the plan.

## 2018-10-07 ENCOUNTER — Other Ambulatory Visit: Payer: Self-pay | Admitting: Family Medicine

## 2018-10-28 ENCOUNTER — Inpatient Hospital Stay: Payer: Medicare Other

## 2018-10-28 ENCOUNTER — Other Ambulatory Visit: Payer: Self-pay

## 2018-10-28 ENCOUNTER — Inpatient Hospital Stay: Payer: Medicare Other | Attending: Hematology

## 2018-10-28 DIAGNOSIS — C250 Malignant neoplasm of head of pancreas: Secondary | ICD-10-CM | POA: Diagnosis not present

## 2018-10-28 DIAGNOSIS — Z923 Personal history of irradiation: Secondary | ICD-10-CM | POA: Diagnosis not present

## 2018-10-28 DIAGNOSIS — Z9221 Personal history of antineoplastic chemotherapy: Secondary | ICD-10-CM | POA: Insufficient documentation

## 2018-10-28 DIAGNOSIS — Z95828 Presence of other vascular implants and grafts: Secondary | ICD-10-CM

## 2018-10-28 LAB — COMPREHENSIVE METABOLIC PANEL
ALT: 33 U/L (ref 0–44)
AST: 22 U/L (ref 15–41)
Albumin: 4 g/dL (ref 3.5–5.0)
Alkaline Phosphatase: 129 U/L — ABNORMAL HIGH (ref 38–126)
Anion gap: 11 (ref 5–15)
BUN: 13 mg/dL (ref 8–23)
CO2: 24 mmol/L (ref 22–32)
Calcium: 9.2 mg/dL (ref 8.9–10.3)
Chloride: 106 mmol/L (ref 98–111)
Creatinine, Ser: 0.75 mg/dL (ref 0.61–1.24)
GFR calc Af Amer: >60 mL/min (ref >60)
GFR calc non Af Amer: >60 mL/min (ref >60)
Glucose, Bld: 117 mg/dL — ABNORMAL HIGH (ref 70–99)
Potassium: 3.8 mmol/L (ref 3.5–5.1)
Sodium: 141 mmol/L (ref 135–145)
Total Bilirubin: 0.6 mg/dL (ref 0.3–1.2)
Total Protein: 7.8 g/dL (ref 6.5–8.1)

## 2018-10-28 LAB — CBC WITH DIFFERENTIAL/PLATELET
Abs Immature Granulocytes: 0.02 10*3/uL (ref 0.00–0.07)
Basophils Absolute: 0.1 10*3/uL (ref 0.0–0.1)
Basophils Relative: 1 %
Eosinophils Absolute: 0.1 10*3/uL (ref 0.0–0.5)
Eosinophils Relative: 1 %
HCT: 42.5 % (ref 39.0–52.0)
Hemoglobin: 13.6 g/dL (ref 13.0–17.0)
Immature Granulocytes: 0 %
Lymphocytes Relative: 12 %
Lymphs Abs: 1.2 10*3/uL (ref 0.7–4.0)
MCH: 28.3 pg (ref 26.0–34.0)
MCHC: 32 g/dL (ref 30.0–36.0)
MCV: 88.5 fL (ref 80.0–100.0)
Monocytes Absolute: 0.7 10*3/uL (ref 0.1–1.0)
Monocytes Relative: 7 %
Neutro Abs: 8 10*3/uL — ABNORMAL HIGH (ref 1.7–7.7)
Neutrophils Relative %: 79 %
Platelets: 249 10*3/uL (ref 150–400)
RBC: 4.8 MIL/uL (ref 4.22–5.81)
RDW: 14.7 % (ref 11.5–15.5)
WBC: 10.2 10*3/uL (ref 4.0–10.5)
nRBC: 0 % (ref 0.0–0.2)

## 2018-10-28 MED ORDER — HEPARIN SOD (PORK) LOCK FLUSH 100 UNIT/ML IV SOLN
500.0000 [IU] | Freq: Once | INTRAVENOUS | Status: AC | PRN
  Administered 2018-10-28: 500 [IU] via INTRAVENOUS
  Filled 2018-10-28: qty 5

## 2018-10-28 MED ORDER — SODIUM CHLORIDE 0.9% FLUSH
10.0000 mL | Freq: Once | INTRAVENOUS | Status: AC
  Administered 2018-10-28: 10 mL
  Filled 2018-10-28: qty 10

## 2018-10-29 LAB — CANCER ANTIGEN 19-9: CA 19-9: 154 U/mL — ABNORMAL HIGH (ref 0–35)

## 2018-10-30 ENCOUNTER — Other Ambulatory Visit: Payer: Self-pay | Admitting: Hematology

## 2018-10-30 ENCOUNTER — Telehealth: Payer: Self-pay

## 2018-10-30 DIAGNOSIS — C250 Malignant neoplasm of head of pancreas: Secondary | ICD-10-CM

## 2018-10-30 NOTE — Telephone Encounter (Signed)
Spoke with patient regarding lab results, per Dr. [name] CA 19.9 has significantly increased concerning for cancer recurrence.  She is recommending CT chest, abdomen and pelvis to be done in the next 2 to 3 weeks rather than waiting for his appointment in May at [facility]. He verbalized an understanding.  The patient agrees and Dr. [name] will order to be done at [facility].

## 2018-10-30 NOTE — Telephone Encounter (Signed)
-----   Message from Truitt Merle, MD sent at 10/29/2018  5:16 PM EDT ----- Please let pt know his lab results, his CA19/9 has significantly increased, concerning for cancer recurrence.  I recommend CT chest, abdomen pelvis in the next 2-3 weeks. I know he has appointment and scan in May at [facility], but I think we need to do the scan sooner due to the elevated CA19.9. let me know if he agrees, and I will order it to be done at [facility]. Thanks  Truitt Merle  10/29/2018

## 2018-11-01 ENCOUNTER — Telehealth: Payer: Self-pay

## 2018-11-01 NOTE — Telephone Encounter (Signed)
Spoke with [facility], scheduled patient's CT CAP for Friday 4/10 at 9:30 to arrive 9:10 NPO no solids 4 hours prior, location at [address]   bring photo ID and insurance card with him, he plans to pick up oral contrast here at the [facility].  I have given him instructions on when to pick this.  He verbalized an understanding and was appreciative of the call.

## 2018-11-04 ENCOUNTER — Telehealth: Payer: Self-pay | Admitting: Hematology

## 2018-11-04 NOTE — Telephone Encounter (Signed)
Spoke with patient re 4/13 webex visit. Patient would like to be contacted on cell and that number has been updated in Epic.

## 2018-11-06 ENCOUNTER — Telehealth: Payer: Self-pay | Admitting: Hematology

## 2018-11-06 ENCOUNTER — Other Ambulatory Visit: Payer: Self-pay

## 2018-11-06 NOTE — Telephone Encounter (Signed)
Spoke with patient and helped him download the [employer] app for his appointment on 4/13. Also verified email and sent the join link to his email.

## 2018-11-08 ENCOUNTER — Ambulatory Visit
Admission: RE | Admit: 2018-11-08 | Discharge: 2018-11-08 | Disposition: A | Discharge status: Home / Self Care | Payer: Medicare Other | Source: Ambulatory Visit | Attending: Hematology | Admitting: Hematology

## 2018-11-08 ENCOUNTER — Other Ambulatory Visit: Payer: Self-pay

## 2018-11-08 DIAGNOSIS — C250 Malignant neoplasm of head of pancreas: Secondary | ICD-10-CM

## 2018-11-08 MED ORDER — IOPAMIDOL (ISOVUE-300) INJECTION 61%
75.0000 mL | Freq: Once | INTRAVENOUS | Status: AC | PRN
  Administered 2018-11-08: 100 mL via INTRAVENOUS

## 2018-11-08 NOTE — Progress Notes (Signed)
[place]   [phone] [phone]   Clinic Follow up Note   Patient Care Team: [name], MD as PCP - General   I connected with Colton Bush on 11/11/2018 at  2:45 PM EDT by telephone visit and verified that I am speaking with the correct person using two identifiers. His routine office visit was changed to a phone visit due to the current COVID-19 pandemic.  I discussed the limitations, risks, security and privacy concerns of performing an evaluation and management service by telephone and the availability of in person appointments. I also discussed with the patient that there may be a patient responsible charge related to this service. The patient expressed understanding and agreed to proceed.   Other persons participating in the visit and their role in the encounter:  None   Patient's location:  home Provider's location:  Office   CHIEF COMPLAINT: Follow up pancreatic cancer  SUMMARY OF ONCOLOGIC HISTORY: Oncology History   Cancer Staging Adenocarcinoma of head of pancreas [facility]) Staging form: Pancreas, AJCC 7th Edition - Clinical stage from 03/09/2016: Stage IIB (T2, N1, M0) - Signed by Truitt Merle, MD on 03/17/2016 - Pathologic stage from 11/17/2016: Stage IB (yT2, N0, cM0) - Signed by Truitt Merle, MD on 12/10/2016       Adenocarcinoma of head of pancreas ([place])   02/27/2016 Imaging    CT chest, abdomen and pelvis with contrast showed ill-defined heterogeneity of pancreatic head, highly suspicious for malignancy, ) quit about and biliary ductal dilatation, mildly prominent lymph nodes in the upper abdomen, largest 2 cm in the portocaval . No other metastasis.     03/09/2016 Initial Diagnosis    Adenocarcinoma of head of pancreas ([place])    03/09/2016 Procedure    Upper EUS showed a 3.5 cm irregular mass in the pancreatic head, causing pancreatic and biliary duct obstruction. The mass involves the portal vein 422 mm, strongly suggesting  invasion. There is suspicious nearby adenopathy    03/09/2016 Initial Biopsy    Fine-needle aspiration of the pancreatic mass from EUS showed malignant cells consistent with adenocarcinoma.     03/28/2016 - 05/24/2016 Chemotherapy    neoadjuvant chemo with FOLFIRINOX every 2 weeks, for 5 cycles     06/28/2016 - 08/10/2016 Radiation Therapy    Neoadjuvant radiation by Dr. [name] Site/dose:   Pancreas treated to 45 Gy in 25 fractions. The Pancreas was then boosted to 54 Gy in 5 fractions.    06/28/2016 - 08/10/2016 Chemotherapy    Xeloda 2000 mg in the morning and 1500 mg in the evening, on the day of radiation.    09/04/2016 Imaging    CT Chest Abdomen Pelvis IMPRESSION: 1. Poorly defined pancreatic head mass is grossly stable, with associated pancreatic ductal dilatation and portacaval adenopathy. No associated vascular encasement. 2. Common bile duct stent in place with stable biliary ductal dilatation. Left hepatic lobe atrophy. 3. Hepatomegaly.  Spleen is at the upper limits normal in size. 4. Aortic atherosclerosis (ICD10-170.0). Coronary artery calcification.    09/19/2016 Surgery    Diagnostic laparoscopy and possible whipple procedure by Dr. Barry Dienes. Whipple procedure aborted due to the pancreatic cancer involving SMV, SMA and portal veins.    09/19/2016 Pathology Results    Two liver lesions were noted during diagnostic laparoscopy and biopsy revealed benign hepatic parenchyma with assoicated fibrosis with no evidence of malignancy.    10/06/2016 - 10/20/2016 Chemotherapy    Gemcitabine and Abraxane weekly, on day 1, 8,  and 15 every 28 days, starting on 10/06/2016, held after1 cycle due to pending surgery at [facility]    10/31/2016 Imaging    CT CAP w contrast at [facility] 10/31/16 Impression: 1.Interval decrease in the size of the ill-defined pancreatic head mass. There is short segment abutment of the portal vein/superior mesenteric vein but no evidence of venous distortion. 2.No evidence  of metastatic disease in the chest, abdomen and pelvis.    11/17/2016 -  Hospital Admission    Patient presents to hospital for nausea and vomitting    11/17/2016 Surgery    pancreatecomy, promximal subtotal with total duodenectomy, partial  GASTRECTOMY, CHOLEDOCHOENTEROSTOMY AND GASTROJEJUNOSTOMY (WHIPPLE-TYPE PROCEDURE); WITH PANCREATOJEJUNOSTOMY    01/19/2017 - 03/02/2017 Chemotherapy    Gemcitabine monotherapy: 1000mg /m2, d1,8 Q21d x2 cycles planned     05/18/2017 Imaging    CT CAP at [facility] 05/18/17 Impression: 1.Status post Whipple with soft tissue density just posterior to the SMA, which may be postsurgical, lymph node, or less likely recurrent disease. Recommend attention on follow-up 2.No evidence of metastatic disease in the chest, abdomen, and pelvis.      09/20/2017 Imaging    CT CAP at [facility] 09/20/17 Impression: 1. Unchanged subcentimeter soft tissue nodule in the Whipple surgical bed, likely a small lymph node. 2. No evidence of metastatic disease in the chest, abdomen, or pelvis.    01/17/2018 Imaging    01/17/2018 CT CAP Impression: No evidence of recurrent or metastatic disease.    06/13/2018 Imaging    CT CAP W Contrast at [facility] on 06/13/18 Impression:  1. Status post Whipple procedure with stable findings at the surgical bed. No convincing evidence of recurrent or metastatic disease within the chest, abdomen, or pelvis.  Electronically Reviewed by:  Gaye Pollack, MD, [facility] Electronically Reviewed on:  06/13/2018 12:15 PM  I have reviewed the images and concur with the above findings.      CURRENT THERAPY:  Surveillance   INTERVAL HISTORY:  Colton Bush is here for a follow up of pancreatic cancer. He was able to identify himself by birth date. Due to his recent elevated tumor marker CA 19.9, he underwent a repeated CT scan last week.  He is clinically doing well, denies any pain, abdominal discomfort, or other symptoms.  His appetite and  energy level are decent, he functions well.  No recent weight loss.    REVIEW OF SYSTEMS:   Constitutional: Denies fevers, chills or abnormal weight loss Eyes: Denies blurriness of vision Ears, nose, mouth, throat, and face: Denies mucositis or sore throat Respiratory: Denies cough, dyspnea or wheezes Cardiovascular: Denies palpitation, chest discomfort or lower extremity swelling Gastrointestinal:  Denies nausea, heartburn or change in bowel habits Skin: Denies abnormal skin rashes Lymphatics: Denies new lymphadenopathy or easy bruising Neurological:Denies numbness, tingling or new weaknesses Behavioral/Psych: Mood is stable, no new changes  All other systems were reviewed with the patient and are negative.  MEDICAL HISTORY:  Past Medical History:  Diagnosis Date  . Arthritis    left hand  . Bronchitis 1977  . Cancer ([place]) 03/09/2016   pancreatic cancer, sees Dr. Cristino Martes at [facility]   . Depression    takes Cymbalta daily  . Diabetes mellitus type II    sees Dr. Chalmers Cater   . GERD (gastroesophageal reflux disease)    takes Omeprazole daily  . H/O hiatal hernia   . Hyperlipidemia    takes Zocor daily  . Hypertension    takes Amlodipine daily  . Hypoglycemia 06/18/2017  .  Neck pain    C4-7 stenosis and herniated disc  . Neuromuscular disorder ([place])    hiatal hernia  . Scoliosis    slight  . Spinal cord injury, C5-C7 ([place])    c4-c7  . Stiffness of hand joint    d/t cervical issues    SURGICAL HISTORY: Past Surgical History:  Procedure Laterality Date  . ANTERIOR CERVICAL DECOMP/DISCECTOMY FUSION  08/18/2011   Procedure: ANTERIOR CERVICAL DECOMPRESSION/DISCECTOMY FUSION 3 LEVELS;  Surgeon: Winfield Cunas, MD;  Location: [place] NEURO ORS;  Service: Neurosurgery;  Laterality: N/A;  Anterior Cervical Four-Five/Five-Six/Six-Seven Decompression with Fusion, Plating, and Bonegraft  . CARPAL TUNNEL RELEASE  2013   bilateral, per Dr. Christella Noa   . COLONOSCOPY  10-30-14   per Dr.  Olevia Perches, clear, repeat in 10 yrs   . egd with esophageal dilation  9-08   per Dr. Olevia Perches  . ERCP N/A 03/01/2016   Procedure: ENDOSCOPIC RETROGRADE CHOLANGIOPANCREATOGRAPHY (ERCP) with brushings and stent;  Surgeon: Doran Stabler, MD;  [facility];  Service: Endoscopy;  Laterality: N/A;  . EUS N/A 03/09/2016   Procedure: ESOPHAGEAL ENDOSCOPIC ULTRASOUND (EUS) RADIAL;  Surgeon: Milus Banister, MD;  [facility];  Service: Endoscopy;  Laterality: N/A;  . lymph nodes biopsy    . melanoma rt calf  1999  . PORTACATH PLACEMENT Left 03/22/2016   Procedure: INSERTION PORT-A-CATH;  Surgeon: Stark Klein, MD;  Location: [facility]S;  Service: General;  Laterality: Left;  . SPINE SURGERY    . TONSILLECTOMY     as a child  . ULNAR TUNNEL RELEASE  2013   right arm, per Dr. Christella Noa   . UPPER GASTROINTESTINAL ENDOSCOPY    . WHIPPLE PROCEDURE N/A 09/19/2016   Procedure: DIAGNOSTIC LAPAROSCOPY, LAPAROSCOPIC LIVER BIOPSY, RETROPERITONEAL EXPLORATION, INTRAOPERATIVE ULTRASOUND;  Surgeon: Stark Klein, MD;  Location: [place];  Service: General;  Laterality: N/A;    I have reviewed the social history and family history with the patient and they are unchanged from previous note.  ALLERGIES:  is allergic to no known allergies.  MEDICATIONS:  Current Outpatient Medications  Medication Sig Dispense Refill  . amLODipine (NORVASC) 10 MG tablet TAKE 1 TABLET BY MOUTH EVERY DAY 90 tablet 3  . benazepril (LOTENSIN) 10 MG tablet TAKE 1 TABLET BY MOUTH EVERY DAY 90 tablet 0  . calcium-vitamin D (OSCAL WITH D) 500-200 MG-UNIT tablet Take 1 tablet daily with breakfast by mouth.    . CREON 36000 units CPEP capsule Take 2 capsules by mouth 3 (three) times daily before meals. Plus 1 capsule prior to snacks  3  . ferrous sulfate 325 (65 FE) MG tablet Take 325 mg daily with breakfast by mouth.    Marland Kitchen FREESTYLE LITE test strip USE ONE STRIP TO CHECK GLUCOSE ONCE DAILY. PLEASE  SCHEDULE FOLLOW UP 50 each 0  .  insulin aspart (NOVOLOG) 100 UNIT/ML injection Give every morning with breakfast and every evening with supper per the following sliding scale: give 3 units for glucose 150-175, give 6 units for glucose 176-200, give 9 units for glucose 201-250, give 12 units for glucose greater than 250 3 vial 11  . Insulin Detemir (LEVEMIR) 100 UNIT/ML Pen Inject 15 Units into the skin daily at 10 pm. 90 mL 3  . Insulin Pen Needle 30G X 5 MM MISC Use one daily with insulin 100 each 2  . KLOR-CON M20 20 MEQ tablet TAKE 1 TABLET BY MOUTH EVERY DAY 90 tablet 0  . Lancets (FREESTYLE) lancets  USE AS INSTRUCTED TO CHECK BLOOD SUGAR ONCE A DAY 100 each 2  . lidocaine-prilocaine (EMLA) cream Apply to affected area once 30 g 3  . loratadine (CLARITIN) 10 MG tablet Take 10 mg by mouth daily.    . Multiple Vitamin (MULTIVITAMIN WITH MINERALS) TABS tablet Take 1 tablet by mouth daily.    . naproxen sodium (ANAPROX) 220 MG tablet Take 440 mg by mouth 2 (two) times daily as needed (for pain.).    Marland Kitchen NOVOLOG FLEXPEN 100 UNIT/ML FlexPen GIVE EVERY MORNING WITH BREAKFAST AND EVERY EVENING WITH SUPPER PER SLIDING SCALE 3 mL 0  . Omega-3 Fatty Acids (FISH OIL) 1200 MG CAPS Take 2,400 mg by mouth daily.    Marland Kitchen omeprazole (PRILOSEC) 20 MG capsule TAKE 1 CAPSULE BY MOUTH EVERY DAY 90 capsule 0   No current facility-administered medications for this visit.    Facility-Administered Medications Ordered in Other Visits  Medication Dose Route Frequency Provider Last Rate Last Dose  . sodium chloride 0.9 % injection 10 mL  10 mL Intravenous PRN Truitt Merle, MD   10 mL at 02/09/17 1111    PHYSICAL EXAMINATION: ECOG PERFORMANCE STATUS: 0 - Asymptomatic  Exam not performed today   LABORATORY DATA:  I have reviewed the data as listed CBC Latest Ref Rng & Units 10/28/2018 09/16/2018 08/08/2018  WBC 4.0 - 10.5 K/uL 10.2 9.2 8.3  Hemoglobin 13.0 - 17.0 g/dL 13.6 13.2 12.7(L)  Hematocrit 39.0 - 52.0 % 42.5 40.5 39.6  Platelets 150 - 400 K/uL  249 242 300     CMP Latest Ref Rng & Units 10/28/2018 09/16/2018 08/08/2018  Glucose 70 - 99 mg/dL 117(H) 94 116(H)  BUN 8 - 23 mg/dL 13 14 15   Creatinine 0.61 - 1.24 mg/dL 0.75 0.76 0.73  Sodium 135 - 145 mmol/L 141 141 142  Potassium 3.5 - 5.1 mmol/L 3.8 3.7 3.7  Chloride 98 - 111 mmol/L 106 108 106  CO2 22 - 32 mmol/L 24 25 26   Calcium 8.9 - 10.3 mg/dL 9.2 9.2 9.2  Total Protein 6.5 - 8.1 g/dL 7.8 7.9 7.7  Total Bilirubin 0.3 - 1.2 mg/dL 0.6 0.5 0.4  Alkaline Phos 38 - 126 U/L 129(H) 132(H) 133(H)  AST 15 - 41 U/L 22 16 21   ALT 0 - 44 U/L 33 26 31      RADIOGRAPHIC STUDIES: I have personally reviewed the radiological images as listed and agreed with the findings in the report. No results found.   ASSESSMENT & PLAN:  Colton Bush is a 63 y.o. male with   1. Primary pancreatic adenocarcinoma, in pancreatic head, cT2N1M0, stage IIB, ypT2N0M0 -He was diagnosed in 02/2016. He is s/p neoadjuvant FOLFIRINOX, concurrent chemoRT with Xeloda, [facility] and adjuvant Gem/Abraxane.  -He is clinically doing very well, denies any pain or other symptoms, no recent weight loss. -Due to his recent rising CA19.9, he underwent a repeated CT CAP on 11/08/18.  I have personally reviewed his CT scan images, and discussed the findings with him.  Fortunately, there is no evidence of cancer recurrence on the scan. He is very pleased with the result.  -We also discussed the limitation of CT scan on dictating very early cancer recurrence, and high risk of pancreatic cancer recurrence after treatment.  I recommend him to continue follow-up closely, with repeat lab in 6 weeks, and a repeat set of CT scan in 3 months. He agrees  -he is scheduled to follow-up with the surgeon at [facility]  in early May. - continue port flushes every 6 weeks.  -f/u in 3 months   2. Type 2 DM -He will continue medication and follow up with his primary care physician Dr. Sarajane Jews    3. HTN  -He is on amlodipine and  benazepril  -f/u with PCP     Plan -CT scan reviewed, no evidence of recurrence -Lab and port flush in 6 weeks and 3 months -f/u in 3 months with a repeated CT CAP if his CA19.9 continue trending up    No problem-specific Assessment & Plan notes found for this encounter.   No orders of the defined types were placed in this encounter.  I discussed the assessment and treatment plan with the patient. The patient was provided an opportunity to ask questions and all were answered. The patient agreed with the plan and demonstrated an understanding of the instructions.  The patient was advised to call back or seek an in-person evaluation if the symptoms worsen or if the condition fails to improve as anticipated.  I provided 15 minutes of non face-to-face telephone visit time during this encounter, and > 50% was spent counseling as documented under my assessment & plan.    Truitt Merle, MD 11/11/2018   I, Joslyn Devon, am acting as scribe for Truitt Merle, MD.   I have reviewed the above documentation for accuracy and completeness, and I agree with the above.

## 2018-11-11 ENCOUNTER — Encounter: Payer: Self-pay | Admitting: Hematology

## 2018-11-11 ENCOUNTER — Telehealth: Payer: Self-pay | Admitting: Hematology

## 2018-11-11 ENCOUNTER — Encounter: Payer: Self-pay | Admitting: *Deleted

## 2018-11-11 ENCOUNTER — Inpatient Hospital Stay: Payer: Medicare Other | Attending: Hematology | Admitting: Hematology

## 2018-11-11 DIAGNOSIS — E1165 Type 2 diabetes mellitus with hyperglycemia: Secondary | ICD-10-CM | POA: Diagnosis not present

## 2018-11-11 DIAGNOSIS — C250 Malignant neoplasm of head of pancreas: Secondary | ICD-10-CM | POA: Diagnosis not present

## 2018-11-11 DIAGNOSIS — I10 Essential (primary) hypertension: Secondary | ICD-10-CM

## 2018-11-11 DIAGNOSIS — IMO0001 Reserved for inherently not codable concepts without codable children: Secondary | ICD-10-CM

## 2018-11-11 NOTE — Telephone Encounter (Signed)
No los per 4/13. °

## 2018-11-14 ENCOUNTER — Other Ambulatory Visit: Payer: Self-pay

## 2018-11-14 ENCOUNTER — Ambulatory Visit (INDEPENDENT_AMBULATORY_CARE_PROVIDER_SITE_OTHER): Payer: Medicare Other | Admitting: Family Medicine

## 2018-11-14 ENCOUNTER — Encounter: Payer: Self-pay | Admitting: Family Medicine

## 2018-11-14 DIAGNOSIS — E1165 Type 2 diabetes mellitus with hyperglycemia: Secondary | ICD-10-CM

## 2018-11-14 DIAGNOSIS — I10 Essential (primary) hypertension: Secondary | ICD-10-CM

## 2018-11-14 DIAGNOSIS — C250 Malignant neoplasm of head of pancreas: Secondary | ICD-10-CM | POA: Diagnosis not present

## 2018-11-14 DIAGNOSIS — IMO0001 Reserved for inherently not codable concepts without codable children: Secondary | ICD-10-CM

## 2018-11-14 MED ORDER — OMEPRAZOLE 20 MG PO CPDR: DELAYED_RELEASE_CAPSULE | ORAL | 3 refills | Status: DC

## 2018-11-14 MED ORDER — BENAZEPRIL HCL 10 MG PO TABS: 10.0000 mg | ORAL_TABLET | Freq: Every day | ORAL | 3 refills | Status: DC

## 2018-11-14 MED ORDER — AMLODIPINE BESYLATE 10 MG PO TABS: 10.0000 mg | ORAL_TABLET | Freq: Every day | ORAL | 3 refills | Status: DC

## 2018-11-14 NOTE — Progress Notes (Signed)
Subjective:    Patient ID: Colton Bush, male    DOB: Nov 12, 1955, 63 y.o.   MRN: [id number]  HPI Virtual Visit via Video Note  I connected with the patient on 11/14/18 at  9:30 AM EDT by a video enabled telemedicine application and verified that I am speaking with the correct person using two identifiers.  Location patient: home Location provider:work or home office Persons participating in the virtual visit: patient, provider  I discussed the limitations of evaluation and management by telemedicine and the availability of in person appointments. The patient expressed understanding and agreed to proceed.   HPI: Here to follow up. He is doing very well in general. Next month will be his 2 year anniversary of being in remission from pancreatic cancer. He sees Dr. Truitt Merle every 3 months. Hhis labs have been looking good although there is some concern about a rise in the CA 19.9. This has risen from 34 one month ago to 154. For this reason he had another CT of the chest, abdomen, and pelvis on 11-08-18 and this was negative for any visible recurrence of the cancer. Dr. [name] plans to repeat a CA 19.9 and a CT scan in 3 months. He feels great. He has plenty of energy and he is pain free. His appetite is good and he has gained some weight. He weighed 213 lbs on 09-16-18 but he has not weighed since then. His BP was well controlled on 09-16-18 at 126/76. His diabetes has not been well controlled however, and this is the negative side of his increased appetite. He has been eating more food. He sees Dr. Chalmers Cater for the diabetes and he does not remember what his last A1c was. His glucoses at home range from 100 to 130 in the mornings fasting but they go up to the 300s in the evenings. He is taking 25 units of Levemir in the evenings, and he takes Novolg by sliding scale BID. He had been taking some potassium but this was stable at 4.1 recently, and his renal function was normal with a creatinine of 1.01.     ROS: See pertinent positives and negatives per HPI.  Past Medical History:  Diagnosis Date  . Arthritis    left hand  . Bronchitis 1977  . Cancer ([place]) 03/09/2016   pancreatic cancer, sees Dr. Cristino Martes at [facility]   . Depression    takes Cymbalta daily  . Diabetes mellitus type II    sees Dr. Chalmers Cater   . GERD (gastroesophageal reflux disease)    takes Omeprazole daily  . H/O hiatal hernia   . Hyperlipidemia    takes Zocor daily  . Hypertension    takes Amlodipine daily  . Hypoglycemia 06/18/2017  . Neck pain    C4-7 stenosis and herniated disc  . Neuromuscular disorder ([place])    hiatal hernia  . Scoliosis    slight  . Spinal cord injury, C5-C7 ([place])    c4-c7  . Stiffness of hand joint    d/t cervical issues    Past Surgical History:  Procedure Laterality Date  . ANTERIOR CERVICAL DECOMP/DISCECTOMY FUSION  08/18/2011   Procedure: ANTERIOR CERVICAL DECOMPRESSION/DISCECTOMY FUSION 3 LEVELS;  Surgeon: Winfield Cunas, MD;  Location: [place] NEURO ORS;  Service: Neurosurgery;  Laterality: N/A;  Anterior Cervical Four-Five/Five-Six/Six-Seven Decompression with Fusion, Plating, and Bonegraft  . CARPAL TUNNEL RELEASE  2013   bilateral, per Dr. Christella Noa   . COLONOSCOPY  10-30-14   per Dr. Olevia Perches,  clear, repeat in 10 yrs   . egd with esophageal dilation  9-08   per Dr. Olevia Perches  . ERCP N/A 03/01/2016   Procedure: ENDOSCOPIC RETROGRADE CHOLANGIOPANCREATOGRAPHY (ERCP) with brushings and stent;  Surgeon: Doran Stabler, MD;  [facility];  Service: Endoscopy;  Laterality: N/A;  . EUS N/A 03/09/2016   Procedure: ESOPHAGEAL ENDOSCOPIC ULTRASOUND (EUS) RADIAL;  Surgeon: Milus Banister, MD;  [facility];  Service: Endoscopy;  Laterality: N/A;  . lymph nodes biopsy    . melanoma rt calf  1999  . PORTACATH PLACEMENT Left 03/22/2016   Procedure: INSERTION PORT-A-CATH;  Surgeon: Stark Klein, MD;  [facility];  Service: General;  Laterality: Left;  . SPINE SURGERY    .  TONSILLECTOMY     as a child  . ULNAR TUNNEL RELEASE  2013   right arm, per Dr. Christella Noa   . UPPER GASTROINTESTINAL ENDOSCOPY    . WHIPPLE PROCEDURE N/A 09/19/2016   Procedure: DIAGNOSTIC LAPAROSCOPY, LAPAROSCOPIC LIVER BIOPSY, RETROPERITONEAL EXPLORATION, INTRAOPERATIVE ULTRASOUND;  Surgeon: Stark Klein, MD;  [facility];  Service: General;  Laterality: N/A;    Family History  Problem Relation Age of Onset  . Heart disease Father   . Heart disease Brother 71  . Anesthesia problems Mother   . Heart disease Mother   . Dementia Mother   . Diabetes Sister   . Stroke Sister   . Colon cancer Neg Hx   . Rectal cancer Neg Hx   . Stomach cancer Neg Hx      Current Outpatient Medications:  .  amLODipine (NORVASC) 10 MG tablet, TAKE 1 TABLET BY MOUTH EVERY DAY, Disp: 90 tablet, Rfl: 3 .  benazepril (LOTENSIN) 10 MG tablet, TAKE 1 TABLET BY MOUTH EVERY DAY, Disp: 90 tablet, Rfl: 0 .  calcium-vitamin D (OSCAL WITH D) 500-200 MG-UNIT tablet, Take 1 tablet daily with breakfast by mouth., Disp: , Rfl:  .  CREON 36000 units CPEP capsule, Take 2 capsules by mouth 3 (three) times daily before meals. Plus 1 capsule prior to snacks, Disp: , Rfl: 3 .  ferrous sulfate 325 (65 FE) MG tablet, Take 325 mg daily with breakfast by mouth., Disp: , Rfl:  .  FREESTYLE LITE test strip, USE ONE STRIP TO CHECK GLUCOSE ONCE DAILY. PLEASE  SCHEDULE FOLLOW UP, Disp: 50 each, Rfl: 0 .  insulin aspart (NOVOLOG) 100 UNIT/ML injection, Give every morning with breakfast and every evening with supper per the following sliding scale: give 3 units for glucose 150-175, give 6 units for glucose 176-200, give 9 units for glucose 201-250, give 12 units for glucose greater than 250, Disp: 3 vial, Rfl: 11 .  Insulin Detemir (LEVEMIR) 100 UNIT/ML Pen, Inject 15 Units into the skin daily at 10 pm., Disp: 90 mL, Rfl: 3 .  Insulin Pen Needle 30G X 5 MM MISC, Use one daily with insulin, Disp: 100 each, Rfl: 2 .  KLOR-CON M20 20 MEQ  tablet, TAKE 1 TABLET BY MOUTH EVERY DAY, Disp: 90 tablet, Rfl: 0 .  Lancets (FREESTYLE) lancets, USE AS INSTRUCTED TO CHECK BLOOD SUGAR ONCE A DAY, Disp: 100 each, Rfl: 2 .  lidocaine-prilocaine (EMLA) cream, Apply to affected area once, Disp: 30 g, Rfl: 3 .  loratadine (CLARITIN) 10 MG tablet, Take 10 mg by mouth daily., Disp: , Rfl:  .  Multiple Vitamin (MULTIVITAMIN WITH MINERALS) TABS tablet, Take 1 tablet by mouth daily., Disp: , Rfl:  .  naproxen sodium (ANAPROX) 220 MG  tablet, Take 440 mg by mouth 2 (two) times daily as needed (for pain.)., Disp: , Rfl:  .  NOVOLOG FLEXPEN 100 UNIT/ML FlexPen, GIVE EVERY MORNING WITH BREAKFAST AND EVERY EVENING WITH SUPPER PER SLIDING SCALE, Disp: 3 mL, Rfl: 0 .  Omega-3 Fatty Acids (FISH OIL) 1200 MG CAPS, Take 2,400 mg by mouth daily., Disp: , Rfl:  .  omeprazole (PRILOSEC) 20 MG capsule, TAKE 1 CAPSULE BY MOUTH EVERY DAY, Disp: 90 capsule, Rfl: 0 No current facility-administered medications for this visit.   Facility-Administered Medications Ordered in Other Visits:  .  sodium chloride 0.9 % injection 10 mL, 10 mL, Intravenous, PRN, Truitt Merle, MD, 10 mL at 02/09/17 1111  EXAM:  VITALS per patient if applicable:  GENERAL: alert, oriented, appears well and in no acute distress  HEENT: atraumatic, conjunttiva clear, no obvious abnormalities on inspection of external nose and ears  NECK: normal movements of the head and neck  LUNGS: on inspection no signs of respiratory distress, breathing rate appears normal, no obvious gross SOB, gasping or wheezing  CV: no obvious cyanosis  MS: moves all visible extremities without noticeable abnormality  PSYCH/NEURO: pleasant and cooperative, no obvious depression or anxiety, speech and thought processing grossly intact  ASSESSMENT AND PLAN: Overall he is doing very well, especially givenhis diagnosis of pancreatic cancer. He is S/P a Whipple surgery and chemotherapy. He will see Dr. [name] again in 2 and  1/2 months. He will see Dr. Chalmers Cater in June. He will follow up with [facility] next month. His HTN is stable.  We will see him in 3 months. We spent 38 minutes discussing these issues.  Alysia Penna, MD  Discussed the following assessment and plan:  No diagnosis found.     I discussed the assessment and treatment plan with the patient. The patient was provided an opportunity to ask questions and all were answered. The patient agreed with the plan and demonstrated an understanding of the instructions.   The patient was advised to call back or seek an in-person evaluation if the symptoms worsen or if the condition fails to improve as anticipated.     Review of Systems     Objective:   Physical Exam        Assessment & Plan:

## 2018-11-20 ENCOUNTER — Telehealth: Payer: Self-pay | Admitting: Hematology

## 2018-11-20 NOTE — Telephone Encounter (Signed)
Scheduled lab and port flush for mid May per sch msg. Called patient. No answer. Left msg

## 2018-12-07 ENCOUNTER — Other Ambulatory Visit: Payer: Self-pay | Admitting: Family Medicine

## 2018-12-07 DIAGNOSIS — C250 Malignant neoplasm of head of pancreas: Secondary | ICD-10-CM

## 2018-12-11 ENCOUNTER — Inpatient Hospital Stay: Payer: Medicare Other | Attending: Hematology

## 2018-12-11 ENCOUNTER — Inpatient Hospital Stay: Payer: Medicare Other

## 2018-12-11 ENCOUNTER — Other Ambulatory Visit: Payer: Self-pay

## 2018-12-11 DIAGNOSIS — Z95828 Presence of other vascular implants and grafts: Secondary | ICD-10-CM

## 2018-12-11 DIAGNOSIS — C250 Malignant neoplasm of head of pancreas: Secondary | ICD-10-CM

## 2018-12-11 DIAGNOSIS — Z9221 Personal history of antineoplastic chemotherapy: Secondary | ICD-10-CM | POA: Insufficient documentation

## 2018-12-11 DIAGNOSIS — Z923 Personal history of irradiation: Secondary | ICD-10-CM | POA: Diagnosis not present

## 2018-12-11 LAB — CBC WITH DIFFERENTIAL/PLATELET
Abs Immature Granulocytes: 0.04 10*3/uL (ref 0.00–0.07)
Basophils Absolute: 0.1 10*3/uL (ref 0.0–0.1)
Basophils Relative: 1 %
Eosinophils Absolute: 0.1 10*3/uL (ref 0.0–0.5)
Eosinophils Relative: 1 %
HCT: 43.7 % (ref 39.0–52.0)
Hemoglobin: 14.5 g/dL (ref 13.0–17.0)
Immature Granulocytes: 0 %
Lymphocytes Relative: 9 %
Lymphs Abs: 1.1 10*3/uL (ref 0.7–4.0)
MCH: 29.8 pg (ref 26.0–34.0)
MCHC: 33.2 g/dL (ref 30.0–36.0)
MCV: 89.9 fL (ref 80.0–100.0)
Monocytes Absolute: 0.7 10*3/uL (ref 0.1–1.0)
Monocytes Relative: 5 %
Neutro Abs: 11 10*3/uL — ABNORMAL HIGH (ref 1.7–7.7)
Neutrophils Relative %: 84 %
Platelets: 268 10*3/uL (ref 150–400)
RBC: 4.86 MIL/uL (ref 4.22–5.81)
RDW: 13.7 % (ref 11.5–15.5)
WBC: 13 10*3/uL — ABNORMAL HIGH (ref 4.0–10.5)
nRBC: 0 % (ref 0.0–0.2)

## 2018-12-11 LAB — COMPREHENSIVE METABOLIC PANEL
ALT: 32 U/L (ref 0–44)
AST: 23 U/L (ref 15–41)
Albumin: 4 g/dL (ref 3.5–5.0)
Alkaline Phosphatase: 135 U/L — ABNORMAL HIGH (ref 38–126)
Anion gap: 11 (ref 5–15)
BUN: 13 mg/dL (ref 8–23)
CO2: 24 mmol/L (ref 22–32)
Calcium: 9.2 mg/dL (ref 8.9–10.3)
Chloride: 106 mmol/L (ref 98–111)
Creatinine, Ser: 0.8 mg/dL (ref 0.61–1.24)
GFR calc Af Amer: >60 mL/min (ref >60)
GFR calc non Af Amer: >60 mL/min (ref >60)
Glucose, Bld: 99 mg/dL (ref 70–99)
Potassium: 3.6 mmol/L (ref 3.5–5.1)
Sodium: 141 mmol/L (ref 135–145)
Total Bilirubin: 0.5 mg/dL (ref 0.3–1.2)
Total Protein: 7.6 g/dL (ref 6.5–8.1)

## 2018-12-11 MED ORDER — HEPARIN SOD (PORK) LOCK FLUSH 100 UNIT/ML IV SOLN
500.0000 [IU] | Freq: Once | INTRAVENOUS | Status: AC | PRN
  Administered 2018-12-11: 500 [IU] via INTRAVENOUS
  Filled 2018-12-11: qty 5

## 2018-12-11 MED ORDER — SODIUM CHLORIDE 0.9% FLUSH
10.0000 mL | Freq: Once | INTRAVENOUS | Status: AC
  Administered 2018-12-11: 10 mL
  Filled 2018-12-11: qty 10

## 2018-12-11 NOTE — Patient Instructions (Signed)

## 2018-12-12 ENCOUNTER — Other Ambulatory Visit: Payer: Self-pay | Admitting: Family Medicine

## 2018-12-12 ENCOUNTER — Telehealth: Payer: Self-pay

## 2018-12-12 DIAGNOSIS — C250 Malignant neoplasm of head of pancreas: Secondary | ICD-10-CM

## 2018-12-12 LAB — CANCER ANTIGEN 19-9: CA 19-9: 35 U/mL (ref 0–35)

## 2018-12-12 NOTE — Telephone Encounter (Signed)
Spoke with patient regarding lab results, per Dr. [name] notified him tumor marker is back down to normal now, this is reassuring, no concerns.  Patient verbalized an understanding and was appreciative of the phone call.

## 2018-12-12 NOTE — Telephone Encounter (Signed)
-----   Message from Truitt Merle, MD sent at 12/12/2018  8:28 AM EDT ----- Please let pt know his lab results, his tumor marker is back down to normal now, which is reassuring, no other concerns, thanks  Truitt Merle  12/12/2018

## 2018-12-29 ENCOUNTER — Other Ambulatory Visit: Payer: Self-pay | Admitting: Family Medicine

## 2018-12-30 DIAGNOSIS — E119 Type 2 diabetes mellitus without complications: Secondary | ICD-10-CM | POA: Diagnosis not present

## 2019-01-06 DIAGNOSIS — E7800 Pure hypercholesterolemia, unspecified: Secondary | ICD-10-CM | POA: Diagnosis not present

## 2019-01-06 DIAGNOSIS — E109 Type 1 diabetes mellitus without complications: Secondary | ICD-10-CM | POA: Diagnosis not present

## 2019-01-10 DIAGNOSIS — I10 Essential (primary) hypertension: Secondary | ICD-10-CM | POA: Diagnosis not present

## 2019-01-10 DIAGNOSIS — E109 Type 1 diabetes mellitus without complications: Secondary | ICD-10-CM | POA: Diagnosis not present

## 2019-01-10 DIAGNOSIS — E7800 Pure hypercholesterolemia, unspecified: Secondary | ICD-10-CM | POA: Diagnosis not present

## 2019-01-10 DIAGNOSIS — C259 Malignant neoplasm of pancreas, unspecified: Secondary | ICD-10-CM | POA: Diagnosis not present

## 2019-01-17 ENCOUNTER — Other Ambulatory Visit: Payer: Self-pay | Admitting: Family Medicine

## 2019-01-28 ENCOUNTER — Telehealth: Payer: Self-pay | Admitting: Hematology

## 2019-01-28 NOTE — Telephone Encounter (Signed)
R/s appt per 6/30 sch message - pt is aware of appt date and time

## 2019-02-06 ENCOUNTER — Encounter (HOSPITAL_COMMUNITY): Payer: Self-pay

## 2019-02-06 ENCOUNTER — Inpatient Hospital Stay: Payer: Medicare Other | Attending: Hematology

## 2019-02-06 ENCOUNTER — Other Ambulatory Visit: Payer: Self-pay

## 2019-02-06 ENCOUNTER — Ambulatory Visit (HOSPITAL_COMMUNITY)
Admission: RE | Admit: 2019-02-06 | Discharge: 2019-02-06 | Disposition: A | Discharge status: Home / Self Care | Payer: Medicare Other | Source: Ambulatory Visit | Attending: Hematology | Admitting: Hematology

## 2019-02-06 DIAGNOSIS — M1990 Unspecified osteoarthritis, unspecified site: Secondary | ICD-10-CM | POA: Insufficient documentation

## 2019-02-06 DIAGNOSIS — Z9221 Personal history of antineoplastic chemotherapy: Secondary | ICD-10-CM | POA: Insufficient documentation

## 2019-02-06 DIAGNOSIS — E119 Type 2 diabetes mellitus without complications: Secondary | ICD-10-CM | POA: Diagnosis not present

## 2019-02-06 DIAGNOSIS — Z923 Personal history of irradiation: Secondary | ICD-10-CM | POA: Diagnosis not present

## 2019-02-06 DIAGNOSIS — C250 Malignant neoplasm of head of pancreas: Secondary | ICD-10-CM | POA: Insufficient documentation

## 2019-02-06 DIAGNOSIS — K219 Gastro-esophageal reflux disease without esophagitis: Secondary | ICD-10-CM | POA: Diagnosis not present

## 2019-02-06 DIAGNOSIS — I700 Atherosclerosis of aorta: Secondary | ICD-10-CM | POA: Diagnosis not present

## 2019-02-06 DIAGNOSIS — I10 Essential (primary) hypertension: Secondary | ICD-10-CM | POA: Diagnosis not present

## 2019-02-06 DIAGNOSIS — I2510 Atherosclerotic heart disease of native coronary artery without angina pectoris: Secondary | ICD-10-CM | POA: Diagnosis not present

## 2019-02-06 DIAGNOSIS — Z79899 Other long term (current) drug therapy: Secondary | ICD-10-CM | POA: Insufficient documentation

## 2019-02-06 DIAGNOSIS — E785 Hyperlipidemia, unspecified: Secondary | ICD-10-CM | POA: Diagnosis not present

## 2019-02-06 DIAGNOSIS — Z794 Long term (current) use of insulin: Secondary | ICD-10-CM | POA: Diagnosis not present

## 2019-02-06 DIAGNOSIS — Z0389 Encounter for observation for other suspected diseases and conditions ruled out: Secondary | ICD-10-CM | POA: Diagnosis not present

## 2019-02-06 DIAGNOSIS — R918 Other nonspecific abnormal finding of lung field: Secondary | ICD-10-CM | POA: Diagnosis not present

## 2019-02-06 DIAGNOSIS — K439 Ventral hernia without obstruction or gangrene: Secondary | ICD-10-CM | POA: Diagnosis not present

## 2019-02-06 LAB — CBC WITH DIFFERENTIAL/PLATELET
Abs Immature Granulocytes: 0.04 10*3/uL (ref 0.00–0.07)
Basophils Absolute: 0.1 10*3/uL (ref 0.0–0.1)
Basophils Relative: 1 %
Eosinophils Absolute: 0.2 10*3/uL (ref 0.0–0.5)
Eosinophils Relative: 2 %
HCT: 45.7 % (ref 39.0–52.0)
Hemoglobin: 15 g/dL (ref 13.0–17.0)
Immature Granulocytes: 0 %
Lymphocytes Relative: 13 %
Lymphs Abs: 1.5 10*3/uL (ref 0.7–4.0)
MCH: 29.2 pg (ref 26.0–34.0)
MCHC: 32.8 g/dL (ref 30.0–36.0)
MCV: 89.1 fL (ref 80.0–100.0)
Monocytes Absolute: 1 10*3/uL (ref 0.1–1.0)
Monocytes Relative: 9 %
Neutro Abs: 8.5 10*3/uL — ABNORMAL HIGH (ref 1.7–7.7)
Neutrophils Relative %: 75 %
Platelets: 299 10*3/uL (ref 150–400)
RBC: 5.13 MIL/uL (ref 4.22–5.81)
RDW: 13.2 % (ref 11.5–15.5)
WBC: 11.3 10*3/uL — ABNORMAL HIGH (ref 4.0–10.5)
nRBC: 0 % (ref 0.0–0.2)

## 2019-02-06 LAB — COMPREHENSIVE METABOLIC PANEL
ALT: 32 U/L (ref 0–44)
AST: 24 U/L (ref 15–41)
Albumin: 4 g/dL (ref 3.5–5.0)
Alkaline Phosphatase: 128 U/L — ABNORMAL HIGH (ref 38–126)
Anion gap: 11 (ref 5–15)
BUN: 17 mg/dL (ref 8–23)
CO2: 26 mmol/L (ref 22–32)
Calcium: 9.7 mg/dL (ref 8.9–10.3)
Chloride: 104 mmol/L (ref 98–111)
Creatinine, Ser: 0.82 mg/dL (ref 0.61–1.24)
GFR calc Af Amer: >60 mL/min (ref >60)
GFR calc non Af Amer: >60 mL/min (ref >60)
Glucose, Bld: 147 mg/dL — ABNORMAL HIGH (ref 70–99)
Potassium: 4.3 mmol/L (ref 3.5–5.1)
Sodium: 141 mmol/L (ref 135–145)
Total Bilirubin: 0.5 mg/dL (ref 0.3–1.2)
Total Protein: 8.2 g/dL — ABNORMAL HIGH (ref 6.5–8.1)

## 2019-02-06 MED ORDER — HEPARIN SOD (PORK) LOCK FLUSH 100 UNIT/ML IV SOLN
INTRAVENOUS | Status: AC
  Administered 2019-02-06: 500 [IU] via INTRAVENOUS
  Filled 2019-02-06: qty 5

## 2019-02-06 MED ORDER — SODIUM CHLORIDE (PF) 0.9 % IJ SOLN
INTRAMUSCULAR | Status: AC
  Filled 2019-02-06: qty 50

## 2019-02-06 MED ORDER — IOHEXOL 300 MG/ML  SOLN
100.0000 mL | Freq: Once | INTRAMUSCULAR | Status: AC | PRN
  Administered 2019-02-06: 100 mL via INTRAVENOUS

## 2019-02-06 MED ORDER — HEPARIN SOD (PORK) LOCK FLUSH 100 UNIT/ML IV SOLN
500.0000 [IU] | Freq: Once | INTRAVENOUS | Status: AC
  Administered 2019-02-06: 09:00:00 500 [IU] via INTRAVENOUS

## 2019-02-07 ENCOUNTER — Telehealth: Payer: Self-pay | Admitting: Hematology

## 2019-02-07 ENCOUNTER — Encounter: Payer: Self-pay | Admitting: Hematology

## 2019-02-07 ENCOUNTER — Inpatient Hospital Stay (HOSPITAL_BASED_OUTPATIENT_CLINIC_OR_DEPARTMENT_OTHER): Payer: Medicare Other | Admitting: Hematology

## 2019-02-07 DIAGNOSIS — C250 Malignant neoplasm of head of pancreas: Secondary | ICD-10-CM | POA: Diagnosis not present

## 2019-02-07 DIAGNOSIS — E1165 Type 2 diabetes mellitus with hyperglycemia: Secondary | ICD-10-CM

## 2019-02-07 DIAGNOSIS — Z923 Personal history of irradiation: Secondary | ICD-10-CM | POA: Diagnosis not present

## 2019-02-07 DIAGNOSIS — IMO0001 Reserved for inherently not codable concepts without codable children: Secondary | ICD-10-CM

## 2019-02-07 DIAGNOSIS — Z9221 Personal history of antineoplastic chemotherapy: Secondary | ICD-10-CM

## 2019-02-07 LAB — CANCER ANTIGEN 19-9: CA 19-9: 45 U/mL — ABNORMAL HIGH (ref 0–35)

## 2019-02-07 NOTE — Progress Notes (Signed)
[facility]   Telephone:([phone]) [phone] [phone]) [phone]   Clinic Follow up Note   Patient Care Team: [name], MD as PCP - General   I connected with [name] on 02/07/2019 at  1:15 PM EDT by video enabled telemedicine visit and verified that I am speaking with the correct person using two identifiers.  I discussed the limitations, risks, security and privacy concerns of performing an evaluation and management service by telephone and the availability of in person appointments. I also discussed with the patient that there may be a patient responsible charge related to this service. The patient expressed understanding and agreed to proceed.   Patient's location:  His home  Provider's location:  My Office   CHIEF COMPLAINT: Follow up pancreatic cancer  SUMMARY OF ONCOLOGIC HISTORY: Oncology History Overview Note  Cancer Staging Adenocarcinoma of head of pancreas ([place]) Staging form: Pancreas, AJCC 7th Edition - Clinical stage from 03/09/2016: Stage IIB (T2, N1, M0) - Signed by Truitt Merle, MD on 03/17/2016 - Pathologic stage from 11/17/2016: Stage IB (yT2, N0, cM0) - Signed by Truitt Merle, MD on 12/10/2016     Adenocarcinoma of head of pancreas ([place])  02/27/2016 Imaging   CT chest, abdomen and pelvis with contrast showed ill-defined heterogeneity of pancreatic head, highly suspicious for malignancy, ) quit about and biliary ductal dilatation, mildly prominent lymph nodes in the upper abdomen, largest 2 cm in the portocaval . No other metastasis.    03/09/2016 Initial Diagnosis   Adenocarcinoma of head of pancreas ([place])   03/09/2016 Procedure   Upper EUS showed a 3.5 cm irregular mass in the pancreatic head, causing pancreatic and biliary duct obstruction. The mass involves the portal vein 422 mm, strongly suggesting invasion. There is suspicious nearby adenopathy   03/09/2016 Initial Biopsy   Fine-needle aspiration of the pancreatic mass from EUS showed malignant  cells consistent with adenocarcinoma.    03/28/2016 - 05/24/2016 Chemotherapy   neoadjuvant chemo with FOLFIRINOX every 2 weeks, for 5 cycles    06/28/2016 - 08/10/2016 Radiation Therapy   Neoadjuvant radiation by Dr. [name] Site/dose:   Pancreas treated to 45 Gy in 25 fractions. The Pancreas was then boosted to 54 Gy in 5 fractions.   06/28/2016 - 08/10/2016 Chemotherapy   Xeloda 2000 mg in the morning and 1500 mg in the evening, on the day of radiation.   09/04/2016 Imaging   CT Chest Abdomen Pelvis IMPRESSION: 1. Poorly defined pancreatic head mass is grossly stable, with associated pancreatic ductal dilatation and portacaval adenopathy. No associated vascular encasement. 2. Common bile duct stent in place with stable biliary ductal dilatation. Left hepatic lobe atrophy. 3. Hepatomegaly.  Spleen is at the upper limits normal in size. 4. Aortic atherosclerosis (ICD10-170.0). Coronary artery calcification.   09/19/2016 Surgery   Diagnostic laparoscopy and possible whipple procedure by Dr. Barry Dienes. Whipple procedure aborted due to the pancreatic cancer involving SMV, SMA and portal veins.   09/19/2016 Pathology Results   Two liver lesions were noted during diagnostic laparoscopy and biopsy revealed benign hepatic parenchyma with assoicated fibrosis with no evidence of malignancy.   10/06/2016 - 10/20/2016 Chemotherapy   Gemcitabine and Abraxane weekly, on day 1, 8, and 15 every 28 days, starting on 10/06/2016, held after1 cycle due to pending surgery at [facility]   10/31/2016 Imaging   CT CAP w contrast at [facility] 10/31/16 Impression: 1.Interval decrease in the size of the ill-defined pancreatic head mass. There is short segment abutment of the portal  vein/superior mesenteric vein but no evidence of venous distortion. 2.No evidence of metastatic disease in the chest, abdomen and pelvis.   11/17/2016 -  Hospital Admission   Patient presents to hospital for nausea and vomitting   11/17/2016 Surgery    pancreatecomy, promximal subtotal with total duodenectomy, partial  GASTRECTOMY, CHOLEDOCHOENTEROSTOMY AND GASTROJEJUNOSTOMY (WHIPPLE-TYPE PROCEDURE); WITH PANCREATOJEJUNOSTOMY   01/19/2017 - 03/02/2017 Chemotherapy   Gemcitabine monotherapy: 104m/m2, d1,8 Q21d x2 cycles planned    05/18/2017 [facility]   [facility] Impression: 1.Status post Whipple with soft tissue density just posterior to the SMA, which may be postsurgical, lymph node, or less likely recurrent disease. Recommend attention on follow-up 2.No evidence of metastatic disease in the chest, abdomen, and pelvis.     09/20/2017 [facility]   [facility] Impression: 1. Unchanged subcentimeter soft tissue nodule in the Whipple surgical bed, likely a small lymph node. 2. No evidence of metastatic disease in the chest, abdomen, or pelvis.   01/17/2018 Imaging   01/17/2018 CT CAP Impression: No evidence of recurrent or metastatic disease.   06/13/2018 [place]   [place] [facility] Impression:  1. Status post Whipple procedure with stable findings at the surgical bed. No convincing evidence of recurrent or metastatic disease within the chest, abdomen, or pelvis.  Electronically Reviewed by:  NGaye Pollack MD, [facility] Electronically Reviewed on:  06/13/2018 12:15 PM  I have reviewed the images and concur with the above findings.   11/08/2018 Imaging   CT CAP W Contrast  IMPRESSION: 1. No findings of active malignancy. Prior Whipple procedure. Biliary stent in place. 2. Other imaging findings of potential clinical significance: Thoracic scoliosis. Aortic Atherosclerosis (ICD10-I70.0). Multilevel lumbar impingement.     02/06/2019 Imaging   CT CAP W Contrast IMPRESSION: 1. Status post Whipple procedure with no definitive findings to suggest metastatic disease in the chest, abdomen or pelvis. 2. Stable tiny pulmonary nodules in the right upper lobe compared to prior  examinations, strongly favored to be benign. 3. Aortic atherosclerosis, in addition to left main and 2 vessel coronary artery disease. Please note that although the presence of coronary artery calcium documents the presence of coronary artery disease, the severity of this disease and any potential stenosis cannot be assessed on this non-gated CT examination. Assessment for potential risk factor modification, dietary therapy or pharmacologic therapy may be warranted, if clinically indicated. 4. Additional incidental findings, as above.      CURRENT THERAPY:  Surveillance  INTERVAL HISTORY:  MJAKSON DELPILARis here for a follow up of pancreatic cancer. He was able to identify himself by face to face video. He notes he is doing well. He denies any pain. He notes he has not returned to work. He notes he is interested in getting his port removed.    REVIEW OF SYSTEMS:   Constitutional: Denies fevers, chills or abnormal weight loss Eyes: Denies blurriness of vision Ears, nose, mouth, throat, and face: Denies mucositis or sore throat Respiratory: Denies cough, dyspnea or wheezes Cardiovascular: Denies palpitation, chest discomfort or lower extremity swelling Gastrointestinal:  Denies nausea, heartburn or change in bowel habits Skin: Denies abnormal skin rashes Lymphatics: Denies new lymphadenopathy or easy bruising Neurological:Denies numbness, tingling or new weaknesses Behavioral/Psych: Mood is stable, no new changes  All other systems were reviewed with the patient and are negative.  MEDICAL HISTORY:  Past Medical History:  Diagnosis Date   Arthritis    left hand   Bronchitis  1977   Cancer ([name]) 03/09/2016   pancreatic cancer, sees Dr. Cristino Martes at [facility]    Depression    takes Cymbalta daily   Diabetes mellitus type II    sees Dr. Chalmers Cater    GERD (gastroesophageal reflux disease)    takes Omeprazole daily   H/O hiatal hernia    Hyperlipidemia    takes Zocor daily     Hypertension    takes Amlodipine daily   Hypoglycemia 06/18/2017   Neck pain    C4-7 stenosis and herniated disc   Neuromuscular disorder ([place])    hiatal hernia   Scoliosis    slight   Spinal cord injury, C5-C7 ([place])    c4-c7   Stiffness of hand joint    d/t cervical issues    SURGICAL HISTORY: Past Surgical History:  Procedure Laterality Date   ANTERIOR CERVICAL DECOMP/DISCECTOMY FUSION  08/18/2011   Procedure: ANTERIOR CERVICAL DECOMPRESSION/DISCECTOMY FUSION 3 LEVELS;  Surgeon: Winfield Cunas, MD;  Location: [facility];  Service: Neurosurgery;  Laterality: N/A;  Anterior Cervical Four-Five/Five-Six/Six-Seven Decompression with Fusion, Plating, and Bonegraft   CARPAL TUNNEL RELEASE  2013   bilateral, per Dr. Christella Noa    COLONOSCOPY  10-30-14   per Dr. Olevia Perches, clear, repeat in 10 yrs    egd with esophageal dilation  9-08   per Dr. Olevia Perches   ERCP N/A 03/01/2016   Procedure: ENDOSCOPIC RETROGRADE CHOLANGIOPANCREATOGRAPHY (ERCP) with brushings and stent;  Surgeon: Doran Stabler, MD;  Location: [facility];  Service: Endoscopy;  Laterality: N/A;   EUS N/A 03/09/2016   Procedure: ESOPHAGEAL ENDOSCOPIC ULTRASOUND (EUS) RADIAL;  Surgeon: Milus Banister, MD;  Location: [facility];  Service: Endoscopy;  Laterality: N/A;   lymph nodes biopsy     melanoma rt calf  1999   PORTACATH PLACEMENT Left 03/22/2016   Procedure: INSERTION PORT-A-CATH;  Surgeon: Stark Klein, MD;  Location: [facility];  Service: General;  Laterality: Left;   SPINE SURGERY     TONSILLECTOMY     as a child   ULNAR TUNNEL RELEASE  2013   right arm, per Dr. Christella Noa    UPPER GASTROINTESTINAL ENDOSCOPY     WHIPPLE PROCEDURE N/A 09/19/2016   Procedure: DIAGNOSTIC LAPAROSCOPY, LAPAROSCOPIC LIVER BIOPSY, RETROPERITONEAL EXPLORATION, INTRAOPERATIVE ULTRASOUND;  Surgeon: Stark Klein, MD;  Location: [facility];  Service: General;  Laterality: N/A;    I have reviewed the social history and family history  with the patient and they are unchanged from previous note.  ALLERGIES:  is allergic to no known allergies.  MEDICATIONS:  Current Outpatient Medications  Medication Sig Dispense Refill   amLODipine (NORVASC) 10 MG tablet TAKE 1 TABLET BY MOUTH EVERY DAY 90 tablet 3   benazepril (LOTENSIN) 10 MG tablet TAKE 1 TABLET BY MOUTH EVERY DAY 90 tablet 0   calcium-vitamin D (OSCAL WITH D) 500-200 MG-UNIT tablet Take 1 tablet daily with breakfast by mouth.     CREON 36000 units CPEP capsule Take 2 capsules by mouth 3 (three) times daily before meals. Plus 1 capsule prior to snacks  3   ferrous sulfate 325 (65 FE) MG tablet Take 325 mg daily with breakfast by mouth.     FREESTYLE LITE test strip USE ONE STRIP TO CHECK GLUCOSE ONCE DAILY. PLEASE  SCHEDULE FOLLOW UP 50 each 0   insulin aspart (NOVOLOG) 100 UNIT/ML injection Give every morning with breakfast and every evening with supper per the following sliding scale: give 3 units for glucose 150-175, give 6  units for glucose 176-200, give 9 units for glucose 201-250, give 12 units for glucose greater than 250 3 vial 11   Insulin Detemir (LEVEMIR) 100 UNIT/ML Pen Inject 15 Units into the skin daily at 10 pm. 90 mL 3   Insulin Pen Needle 30G X 5 MM MISC Use one daily with insulin 100 each 2   Lancets (FREESTYLE) lancets USE AS INSTRUCTED TO CHECK BLOOD SUGAR ONCE A DAY 100 each 2   lidocaine-prilocaine (EMLA) cream Apply to affected area once 30 g 3   loratadine (CLARITIN) 10 MG tablet Take 10 mg by mouth daily.     Multiple Vitamin (MULTIVITAMIN WITH MINERALS) TABS tablet Take 1 tablet by mouth daily.     naproxen sodium (ANAPROX) 220 MG tablet Take 440 mg by mouth 2 (two) times daily as needed (for pain.).     NOVOLOG FLEXPEN 100 UNIT/ML FlexPen GIVE EVERY MORNING WITH BREAKFAST AND EVERY EVENING WITH SUPPER PER SLIDING SCALE 3 mL 0   Omega-3 Fatty Acids (FISH OIL) 1200 MG CAPS Take 2,400 mg by mouth daily.     omeprazole (PRILOSEC) 20  MG capsule TAKE 1 CAPSULE BY MOUTH EVERY DAY 90 capsule 3   No current facility-administered medications for this visit.    Facility-Administered Medications Ordered in Other Visits  Medication Dose Route Frequency Provider Last Rate Last Dose   sodium chloride 0.9 % injection 10 mL  10 mL Intravenous PRN Truitt Merle, MD   10 mL at 02/09/17 1111    PHYSICAL EXAMINATION: ECOG PERFORMANCE STATUS: 0 - Asymptomatic  No vitals taken today, Exam not performed today   LABORATORY DATA:  I have reviewed the data as listed CBC Latest Ref Rng & Units 02/06/2019 12/11/2018 10/28/2018  WBC 4.0 - 10.5 K/uL 11.3(H) 13.0(H) 10.2  Hemoglobin 13.0 - 17.0 g/dL 15.0 14.5 13.6  Hematocrit 39.0 - 52.0 % 45.7 43.7 42.5  Platelets 150 - 400 K/uL 299 268 249     CMP Latest Ref Rng & Units 02/06/2019 12/11/2018 10/28/2018  Glucose 70 - 99 mg/dL 147(H) 99 117(H)  BUN 8 - 23 mg/dL 17 13 13   Creatinine 0.61 - 1.24 mg/dL 0.82 0.80 0.75  Sodium 135 - 145 mmol/L 141 141 141  Potassium 3.5 - 5.1 mmol/L 4.3 3.6 3.8  Chloride 98 - 111 mmol/L 104 106 106  CO2 22 - 32 mmol/L 26 24 24   Calcium 8.9 - 10.3 mg/dL 9.7 9.2 9.2  Total Protein 6.5 - 8.1 g/dL 8.2(H) 7.6 7.8  Total Bilirubin 0.3 - 1.2 mg/dL 0.5 0.5 0.6  Alkaline Phos 38 - 126 U/L 128(H) 135(H) 129(H)  AST 15 - 41 U/L 24 23 22   ALT 0 - 44 U/L 32 32 33      RADIOGRAPHIC STUDIES: I have personally reviewed the radiological images as listed and agreed with the findings in the report. Ct Chest W Contrast  Result Date: 02/06/2019 CLINICAL DATA:  63 year old male with history of pancreatic cancer diagnosed in June 2017 status post Whipple procedure in 2018 followed by chemotherapy and radiation therapy completed in 2018. Follow-up study. No current complaints. EXAM: CT CHEST, ABDOMEN, AND PELVIS WITH CONTRAST TECHNIQUE: Multidetector CT imaging of the chest, abdomen and pelvis was performed following the standard protocol during bolus administration of intravenous  contrast. CONTRAST:  114m OMNIPAQUE IOHEXOL 300 MG/ML  SOLN COMPARISON:  CT the chest, abdomen and pelvis 11/08/2018. FINDINGS: CT CHEST FINDINGS Cardiovascular: Heart size is normal. There is no significant pericardial fluid, thickening or pericardial  calcification. There is aortic atherosclerosis, as well as atherosclerosis of the great vessels of the mediastinum and the coronary arteries, including calcified atherosclerotic plaque in the left main, left anterior descending and right coronary arteries. Left-sided subclavian single-lumen porta cath with tip terminating in the distal superior vena cava. Mediastinum/Nodes: No pathologically enlarged mediastinal or hilar lymph nodes. Esophagus is unremarkable in appearance. No axillary lymphadenopathy. Lungs/Pleura: There are tiny pulmonary nodules in the right upper lobe measuring 2 mm (axial image 37 of series 9) and 4 mm (axial image 94 of series 9), stable compared to the prior study. No other larger more suspicious appearing pulmonary nodules or masses are noted. Mild linear scarring in the medial segment of the right middle lobe and inferior aspect of the lingula. No acute consolidative airspace disease. No pleural effusions. Musculoskeletal: Orthopedic fixation hardware in the lower cervical spine incidentally noted. There are no aggressive appearing lytic or blastic lesions noted in the visualized portions of the skeleton. CT ABDOMEN PELVIS FINDINGS Hepatobiliary: No suspicious cystic or solid hepatic lesions. No intra or extrahepatic biliary ductal dilatation. Status post cholecystectomy. Biliary stent extending from the intrahepatic biliary tree in the left lobe of the liver into the duodenum. Pancreas: Postoperative changes of Whipple procedure. Atrophy in the body and tail of the pancreas. No peripancreatic fluid collections or inflammatory changes. Spleen: Unremarkable. Adrenals/Urinary Tract: Subcentimeter low-attenuation lesions in both kidneys, too  small to characterize, but statistically likely to represent tiny cysts. Bilateral adrenal glands are normal in appearance. No hydroureteronephrosis. Urinary bladder is normal in appearance. Stomach/Bowel: Postoperative changes of Whipple procedure. No pathologic dilatation of small bowel or colon. Normal appendix. Vascular/Lymphatic: Aortic atherosclerosis, without evidence of aneurysm or dissection in the abdominal or pelvic vasculature. No lymphadenopathy noted in the abdomen or pelvis. Reproductive: Prostate gland and seminal vesicles are unremarkable in appearance. Other: Small ventral hernia containing short segments of small bowel and transverse colon. No significant volume of ascites. No pneumoperitoneum. Musculoskeletal: There are no aggressive appearing lytic or blastic lesions noted in the visualized portions of the skeleton. IMPRESSION: 1. Status post Whipple procedure with no definitive findings to suggest metastatic disease in the chest, abdomen or pelvis. 2. Stable tiny pulmonary nodules in the right upper lobe compared to prior examinations, strongly favored to be benign. 3. Aortic atherosclerosis, in addition to left main and 2 vessel coronary artery disease. Please note that although the presence of coronary artery calcium documents the presence of coronary artery disease, the severity of this disease and any potential stenosis cannot be assessed on this non-gated CT examination. Assessment for potential risk factor modification, dietary therapy or pharmacologic therapy may be warranted, if clinically indicated. 4. Additional incidental findings, as above. Electronically Signed   By: Vinnie Langton M.D.   On: 02/06/2019 09:59   Ct Abdomen Pelvis W Contrast  Result Date: 02/06/2019 CLINICAL DATA:  63 year old male with history of pancreatic cancer diagnosed in June 2017 status post Whipple procedure in 2018 followed by chemotherapy and radiation therapy completed in 2018. Follow-up study. No  current complaints. EXAM: CT CHEST, ABDOMEN, AND PELVIS WITH CONTRAST TECHNIQUE: Multidetector CT imaging of the chest, abdomen and pelvis was performed following the standard protocol during bolus administration of intravenous contrast. CONTRAST:  11m OMNIPAQUE IOHEXOL 300 MG/ML  SOLN COMPARISON:  CT the chest, abdomen and pelvis 11/08/2018. FINDINGS: CT CHEST FINDINGS Cardiovascular: Heart size is normal. There is no significant pericardial fluid, thickening or pericardial calcification. There is aortic atherosclerosis, as well as atherosclerosis of the  great vessels of the mediastinum and the coronary arteries, including calcified atherosclerotic plaque in the left main, left anterior descending and right coronary arteries. Left-sided subclavian single-lumen porta cath with tip terminating in the distal superior vena cava. Mediastinum/Nodes: No pathologically enlarged mediastinal or hilar lymph nodes. Esophagus is unremarkable in appearance. No axillary lymphadenopathy. Lungs/Pleura: There are tiny pulmonary nodules in the right upper lobe measuring 2 mm (axial image 37 of series 9) and 4 mm (axial image 94 of series 9), stable compared to the prior study. No other larger more suspicious appearing pulmonary nodules or masses are noted. Mild linear scarring in the medial segment of the right middle lobe and inferior aspect of the lingula. No acute consolidative airspace disease. No pleural effusions. Musculoskeletal: Orthopedic fixation hardware in the lower cervical spine incidentally noted. There are no aggressive appearing lytic or blastic lesions noted in the visualized portions of the skeleton. CT ABDOMEN PELVIS FINDINGS Hepatobiliary: No suspicious cystic or solid hepatic lesions. No intra or extrahepatic biliary ductal dilatation. Status post cholecystectomy. Biliary stent extending from the intrahepatic biliary tree in the left lobe of the liver into the duodenum. Pancreas: Postoperative changes of  Whipple procedure. Atrophy in the body and tail of the pancreas. No peripancreatic fluid collections or inflammatory changes. Spleen: Unremarkable. Adrenals/Urinary Tract: Subcentimeter low-attenuation lesions in both kidneys, too small to characterize, but statistically likely to represent tiny cysts. Bilateral adrenal glands are normal in appearance. No hydroureteronephrosis. Urinary bladder is normal in appearance. Stomach/Bowel: Postoperative changes of Whipple procedure. No pathologic dilatation of small bowel or colon. Normal appendix. Vascular/Lymphatic: Aortic atherosclerosis, without evidence of aneurysm or dissection in the abdominal or pelvic vasculature. No lymphadenopathy noted in the abdomen or pelvis. Reproductive: Prostate gland and seminal vesicles are unremarkable in appearance. Other: Small ventral hernia containing short segments of small bowel and transverse colon. No significant volume of ascites. No pneumoperitoneum. Musculoskeletal: There are no aggressive appearing lytic or blastic lesions noted in the visualized portions of the skeleton. IMPRESSION: 1. Status post Whipple procedure with no definitive findings to suggest metastatic disease in the chest, abdomen or pelvis. 2. Stable tiny pulmonary nodules in the right upper lobe compared to prior examinations, strongly favored to be benign. 3. Aortic atherosclerosis, in addition to left main and 2 vessel coronary artery disease. Please note that although the presence of coronary artery calcium documents the presence of coronary artery disease, the severity of this disease and any potential stenosis cannot be assessed on this non-gated CT examination. Assessment for potential risk factor modification, dietary therapy or pharmacologic therapy may be warranted, if clinically indicated. 4. Additional incidental findings, as above. Electronically Signed   By: Vinnie Langton M.D.   On: 02/06/2019 09:59     ASSESSMENT & PLAN:  Colton Bush  is a 64 y.o. male with   1. Primary pancreatic adenocarcinoma, in pancreatic head, cT2N1M0, stage IIB, ypT2N0M0 -He was diagnosed in 02/2016. He is s/p neoadjuvant FOLFIRINOX, concurrentchemoRT with Xeloda, Whipple surgery at [facility] and adjuvant Gem/Abraxane.  -Due to his previous rising CA19.9, he underwent a repeated CT CAP on 11/08/18 which was negative for recurrence  -We discussed his CT CAP from 02/06/19 which shows NED, stable tiny right pulmonary nodules, likely benign.  -His scan also showed atherosclerosis. I recommend he f/u with PCP for more work up.  -Labs from yesterday reviewed, CBC and CMP WNL except WBC 11.3, ANC 8.5, BG 147, Protein 8.2, Alk Phos. 128. Ca 19.9 at 45.  -I discussed his  Ca 19.9 was previously normal last month and is now mildly elevated. I discussed this can be nonspecific and not solely related to his cancer. Will monitor.  -He is clinically doing well and stable. He is fine to remove his port this year. He is agreeable. I will send message to Dr Barry Dienes.  -Continue Surveillance. Will repeat scan in 6-12 months.  -F/u in 4 months    2. Type 2 DM -He will continue medication and follow up with his primary care physician Dr. Sarajane Jews    3. HTN  -He is on amlodipine and benazepril  -f/u with PCP    Plan -lab and CT CAP reviewed, NED  -He will f/u with PCP for atherosclerosis  -Flush in 2 months -Send message to Dr Barry Dienes about [facility] removal  -Lab and f/u in 4 months     No problem-specific Assessment & Plan notes found for this encounter.   No orders of the defined types were placed in this encounter.  I discussed the assessment and treatment plan with the patient. The patient was provided an opportunity to ask questions and all were answered. The patient agreed with the plan and demonstrated an understanding of the instructions.  The patient was advised to call back or seek an in-person evaluation if the symptoms worsen or if the condition fails to  improve as anticipated.  I provided 15 minutes of face-to-face video visit time during this encounter, and > 50% was spent counseling as documented under my assessment & plan.    Truitt Merle, MD 02/07/2019   I, Joslyn Devon, am acting as scribe for Truitt Merle, MD.   I have reviewed the above documentation for accuracy and completeness, and I agree with the above.

## 2019-02-07 NOTE — Telephone Encounter (Signed)
Per YF moved 8/17 f/u to today as doximity video visit. Confirmed with patient.

## 2019-02-10 ENCOUNTER — Telehealth: Payer: Self-pay | Admitting: Hematology

## 2019-02-10 ENCOUNTER — Other Ambulatory Visit: Payer: Self-pay | Admitting: General Surgery

## 2019-02-10 NOTE — Telephone Encounter (Signed)
Scheduled appt per 7/10 los. Spoke with patient and he is aware of his appt date and time.

## 2019-02-12 ENCOUNTER — Telehealth: Payer: Self-pay | Admitting: *Deleted

## 2019-02-12 DIAGNOSIS — I2510 Atherosclerotic heart disease of native coronary artery without angina pectoris: Secondary | ICD-10-CM

## 2019-02-12 NOTE — Telephone Encounter (Signed)
Notified patient that Dr. Sarajane Jews did see the coronary artery calcifications, and did put in a referral to Cardiology. Patient verbalized understanding.

## 2019-02-12 NOTE — Telephone Encounter (Signed)
I saw the coronary artery calcifications, so I did a referral to Cardiology

## 2019-02-12 NOTE — Telephone Encounter (Signed)
Copied from [place] [id number]. Topic: General - Inquiry >> Feb 11, 2019 11:43 AM Colton Bush A wrote: Patient called to inform Dr. Sarajane Jews of the CT scan done on 02/06/2019 and patient would like to know if Dr. Sarajane Jews advises patient needs to be seen by cardiologist. Patient requesting a callback.

## 2019-02-13 DIAGNOSIS — K811 Chronic cholecystitis: Secondary | ICD-10-CM | POA: Diagnosis not present

## 2019-02-13 DIAGNOSIS — C250 Malignant neoplasm of head of pancreas: Secondary | ICD-10-CM | POA: Diagnosis not present

## 2019-02-13 DIAGNOSIS — Z79899 Other long term (current) drug therapy: Secondary | ICD-10-CM | POA: Diagnosis not present

## 2019-02-13 DIAGNOSIS — Z794 Long term (current) use of insulin: Secondary | ICD-10-CM | POA: Diagnosis not present

## 2019-02-13 DIAGNOSIS — Z90411 Acquired partial absence of pancreas: Secondary | ICD-10-CM | POA: Diagnosis not present

## 2019-02-13 DIAGNOSIS — R197 Diarrhea, unspecified: Secondary | ICD-10-CM | POA: Diagnosis not present

## 2019-02-13 DIAGNOSIS — Z9689 Presence of other specified functional implants: Secondary | ICD-10-CM | POA: Diagnosis not present

## 2019-02-13 DIAGNOSIS — Z9049 Acquired absence of other specified parts of digestive tract: Secondary | ICD-10-CM | POA: Diagnosis not present

## 2019-02-26 ENCOUNTER — Other Ambulatory Visit: Payer: Self-pay | Admitting: Family Medicine

## 2019-03-02 ENCOUNTER — Other Ambulatory Visit: Payer: Self-pay | Admitting: Family Medicine

## 2019-03-02 DIAGNOSIS — C250 Malignant neoplasm of head of pancreas: Secondary | ICD-10-CM

## 2019-03-17 ENCOUNTER — Ambulatory Visit: Payer: Self-pay | Admitting: Hematology

## 2019-03-17 ENCOUNTER — Other Ambulatory Visit: Payer: Self-pay

## 2019-03-17 ENCOUNTER — Other Ambulatory Visit: Payer: Self-pay | Admitting: Family Medicine

## 2019-03-17 DIAGNOSIS — C250 Malignant neoplasm of head of pancreas: Secondary | ICD-10-CM

## 2019-03-17 NOTE — Telephone Encounter (Signed)
Not on the patients current med list. Last filled 09/13/17 Last OV 11/14/2018  Should patient continue this medication?

## 2019-03-21 ENCOUNTER — Other Ambulatory Visit (HOSPITAL_COMMUNITY): Payer: Medicare Other

## 2019-03-29 ENCOUNTER — Other Ambulatory Visit (HOSPITAL_COMMUNITY): Payer: Medicare Other

## 2019-03-31 ENCOUNTER — Other Ambulatory Visit (HOSPITAL_COMMUNITY)
Admission: RE | Admit: 2019-03-31 | Discharge: 2019-03-31 | Disposition: A | Discharge status: Home / Self Care | Payer: Medicare Other | Source: Ambulatory Visit | Attending: General Surgery | Admitting: General Surgery

## 2019-03-31 DIAGNOSIS — Z20828 Contact with and (suspected) exposure to other viral communicable diseases: Secondary | ICD-10-CM | POA: Insufficient documentation

## 2019-03-31 DIAGNOSIS — C259 Malignant neoplasm of pancreas, unspecified: Secondary | ICD-10-CM | POA: Diagnosis not present

## 2019-03-31 DIAGNOSIS — Z01812 Encounter for preprocedural laboratory examination: Secondary | ICD-10-CM | POA: Diagnosis not present

## 2019-03-31 NOTE — Pre-Procedure Instructions (Signed)
[id number] [place] [address]. [place] [address] Phone: [phone] Fax: [phone]    Your procedure is scheduled on Thurs., Sept. 3, 2020 from 7:30AM-8:30AM  Report to [facility] Entrance "A" at 5:30AM  Call this number if you have problems the morning of surgery:  [phone]   Remember:  Do not eat after midnight on Sept. 2nd  You may drink clear liquids until 3 hours (4:30AM) prior to surgery time.  Clear liquids allowed are:  Water, Juice (non-citric and without pulp), Carbonated beverages, Clear Tea, Black Coffee only, Plain Jell-O only, Gatorade and Plain Popsicles only    Take these medicines the morning of surgery with A SIP OF WATER: AmLODipine (NORVASC) Loratadine (CLARITIN)  Omeprazole (PRILOSEC)  As of today, stop taking all Aspirin (unless instructed by your doctor) and Other Aspirin containing products, Vitamins, Fish oils, and Herbal medications. Also stop all NSAIDS i.e. Advil, Ibuprofen, Motrin, Aleve, Anaprox, Naproxen, BC, Goody Powders, and all Supplements.     . THE NIGHT BEFORE SURGERY, take ______7.5_____ units of ____Levemir_______insulin.   . THE MORNING OF SURGERY, take _____7.5________ units of _____Levemir_____insulin.    How to Manage Your Diabetes Before and After Surgery  Why is it important to control my blood sugar before and after surgery? . Improving blood sugar levels before and after surgery helps healing and can limit problems. . A way of improving blood sugar control is eating a healthy diet by: o  Eating less sugar and carbohydrates o  Increasing activity/exercise o  Talking with your doctor about reaching your blood sugar goals . High blood sugars (greater than 180 mg/dL) can raise your risk of infections and slow your recovery, so you will need to focus on controlling your diabetes during the weeks before surgery. . Make sure  that the doctor who takes care of your diabetes knows about your planned surgery including the date and location.  How do I manage my blood sugar before surgery? . Check your blood sugar at least 4 times a day, starting 2 days before surgery, to make sure that the level is not too high or low. o Check your blood sugar the morning of your surgery when you wake up and every 2 hours until you get to the Short Stay unit. . If your blood sugar is less than 70 mg/dL, you will need to treat for low blood sugar: o Do not take insulin. o Treat a low blood sugar (less than 70 mg/dL) with  cup of clear juice (cranberry or apple), 4 glucose tablets, OR glucose gel. Recheck blood sugar in 15 minutes after treatment (to make sure it is greater than 70 mg/dL). If your blood sugar is not greater than 70 mg/dL on recheck, call [phone] o  for further instructions. o If your CBG is greater than 220 mg/dL, you may take  of your sliding scale (Novolog) dose of insulin.  . If you are admitted to the hospital after surgery: o Your blood sugar will be checked by the staff and you will probably be given insulin after surgery (instead of oral diabetes medicines) to make sure you have good blood sugar levels. o The goal for blood sugar control after surgery is 80-180 mg/dl.  Reviewed and Endorsed by [facility] Patient Education Committee, August 2015   Special instructions:   [facility]- Preparing For Surgery  Before surgery, you can play an important  role. Because skin is not sterile, your skin needs to be as free of germs as possible. You can reduce the number of germs on your skin by washing with CHG (chlorahexidine gluconate) Soap before surgery.  CHG is an antiseptic cleaner which kills germs and bonds with the skin to continue killing germs even after washing.    Please do not use if you have an allergy to CHG or antibacterial soaps. If your skin becomes reddened/irritated stop using the CHG.  Do not  shave (including legs and underarms) for at least 48 hours prior to first CHG shower. It is OK to shave your face.  Please follow these instructions carefully.   1. Shower the NIGHT BEFORE SURGERY and the MORNING OF SURGERY with CHG.   2. If you chose to wash your hair, wash your hair first as usual with your normal shampoo.  3. After you shampoo, rinse your hair and body thoroughly to remove the shampoo.  4. Use CHG as you would any other liquid soap. You can apply CHG directly to the skin and wash gently with a scrungie or a clean washcloth.   5. Apply the CHG Soap to your body ONLY FROM THE NECK DOWN.  Do not use on open wounds or open sores. Avoid contact with your eyes, ears, mouth and genitals (private parts). Wash Face and genitals (private parts)  with your normal soap.  6. Wash thoroughly, paying special attention to the area where your surgery will be performed.  7. Thoroughly rinse your body with warm water from the neck down.  8. DO NOT shower/wash with your normal soap after using and rinsing off the CHG Soap.  9. Pat yourself dry with a CLEAN TOWEL.  10. Wear CLEAN PAJAMAS to bed the night before surgery, wear comfortable clothes the morning of surgery  11. Place CLEAN SHEETS on your bed the night of your first shower and DO NOT SLEEP WITH PETS.   Day of Surgery:             Remember to brush your teeth WITH YOUR REGULAR TOOTHPASTE.  Do not wear jewelry.  Do not wear lotions, powders, colognes, or deodorant.  Do not shave 48 hours prior to surgery.  Men may shave face and neck.  Do not bring valuables to the hospital.  [facility] is not responsible for any belongings or valuables.  Contacts, dentures or bridgework may not be worn into surgery.   For patients admitted to the hospital, discharge time will be determined by your treatment team.  Patients discharged the day of surgery will not be allowed to drive home.   Please wear clean clothes to the  hospital/surgery center.    Please read over the following fact sheets that you were given. Pain Booklet, Coughing and Deep Breathing and Surgical Site Infection Prevention

## 2019-04-01 ENCOUNTER — Encounter (HOSPITAL_COMMUNITY): Payer: Self-pay

## 2019-04-01 ENCOUNTER — Other Ambulatory Visit: Payer: Self-pay

## 2019-04-01 ENCOUNTER — Encounter (HOSPITAL_COMMUNITY)
Admission: RE | Admit: 2019-04-01 | Discharge: 2019-04-01 | Disposition: A | Discharge status: Home / Self Care | Payer: Medicare Other | Source: Ambulatory Visit | Attending: General Surgery | Admitting: General Surgery

## 2019-04-01 DIAGNOSIS — Z794 Long term (current) use of insulin: Secondary | ICD-10-CM | POA: Diagnosis not present

## 2019-04-01 DIAGNOSIS — E119 Type 2 diabetes mellitus without complications: Secondary | ICD-10-CM | POA: Diagnosis not present

## 2019-04-01 DIAGNOSIS — Z8507 Personal history of malignant neoplasm of pancreas: Secondary | ICD-10-CM | POA: Diagnosis not present

## 2019-04-01 DIAGNOSIS — M419 Scoliosis, unspecified: Secondary | ICD-10-CM | POA: Diagnosis not present

## 2019-04-01 DIAGNOSIS — Z981 Arthrodesis status: Secondary | ICD-10-CM | POA: Diagnosis not present

## 2019-04-01 DIAGNOSIS — Z9889 Other specified postprocedural states: Secondary | ICD-10-CM | POA: Diagnosis not present

## 2019-04-01 DIAGNOSIS — F329 Major depressive disorder, single episode, unspecified: Secondary | ICD-10-CM | POA: Diagnosis not present

## 2019-04-01 DIAGNOSIS — K219 Gastro-esophageal reflux disease without esophagitis: Secondary | ICD-10-CM | POA: Diagnosis not present

## 2019-04-01 DIAGNOSIS — Z820 Family history of epilepsy and other diseases of the nervous system: Secondary | ICD-10-CM | POA: Diagnosis not present

## 2019-04-01 DIAGNOSIS — Z833 Family history of diabetes mellitus: Secondary | ICD-10-CM | POA: Diagnosis not present

## 2019-04-01 DIAGNOSIS — M19042 Primary osteoarthritis, left hand: Secondary | ICD-10-CM | POA: Diagnosis not present

## 2019-04-01 DIAGNOSIS — K449 Diaphragmatic hernia without obstruction or gangrene: Secondary | ICD-10-CM | POA: Diagnosis not present

## 2019-04-01 DIAGNOSIS — M542 Cervicalgia: Secondary | ICD-10-CM | POA: Diagnosis not present

## 2019-04-01 DIAGNOSIS — I10 Essential (primary) hypertension: Secondary | ICD-10-CM | POA: Diagnosis not present

## 2019-04-01 DIAGNOSIS — Z85820 Personal history of malignant melanoma of skin: Secondary | ICD-10-CM | POA: Diagnosis not present

## 2019-04-01 DIAGNOSIS — Z79899 Other long term (current) drug therapy: Secondary | ICD-10-CM | POA: Diagnosis not present

## 2019-04-01 DIAGNOSIS — Z8249 Family history of ischemic heart disease and other diseases of the circulatory system: Secondary | ICD-10-CM | POA: Diagnosis not present

## 2019-04-01 DIAGNOSIS — Z4589 Encounter for adjustment and management of other implanted devices: Secondary | ICD-10-CM | POA: Diagnosis not present

## 2019-04-01 DIAGNOSIS — Z823 Family history of stroke: Secondary | ICD-10-CM | POA: Diagnosis not present

## 2019-04-01 DIAGNOSIS — E785 Hyperlipidemia, unspecified: Secondary | ICD-10-CM | POA: Diagnosis not present

## 2019-04-01 LAB — CBC
HCT: 46.2 % (ref 39.0–52.0)
Hemoglobin: 14.9 g/dL (ref 13.0–17.0)
MCH: 29.7 pg (ref 26.0–34.0)
MCHC: 32.3 g/dL (ref 30.0–36.0)
MCV: 92 fL (ref 80.0–100.0)
Platelets: 306 10*3/uL (ref 150–400)
RBC: 5.02 MIL/uL (ref 4.22–5.81)
RDW: 13.2 % (ref 11.5–15.5)
WBC: 7.2 10*3/uL (ref 4.0–10.5)
nRBC: 0 % (ref 0.0–0.2)

## 2019-04-01 LAB — BASIC METABOLIC PANEL
Anion gap: 11 (ref 5–15)
BUN: 12 mg/dL (ref 8–23)
CO2: 24 mmol/L (ref 22–32)
Calcium: 9.1 mg/dL (ref 8.9–10.3)
Chloride: 102 mmol/L (ref 98–111)
Creatinine, Ser: 0.59 mg/dL — ABNORMAL LOW (ref 0.61–1.24)
GFR calc Af Amer: >60 mL/min (ref >60)
GFR calc non Af Amer: >60 mL/min (ref >60)
Glucose, Bld: 306 mg/dL — ABNORMAL HIGH (ref 70–99)
Potassium: 3.8 mmol/L (ref 3.5–5.1)
Sodium: 137 mmol/L (ref 135–145)

## 2019-04-01 LAB — HEMOGLOBIN A1C
Hgb A1c MFr Bld: 9.4 % — ABNORMAL HIGH (ref 4.8–5.6)
Mean Plasma Glucose: 223.08 mg/dL

## 2019-04-01 LAB — SARS CORONAVIRUS 2 (TAT 6-24 HRS): SARS Coronavirus 2: NEGATIVE

## 2019-04-01 LAB — GLUCOSE, CAPILLARY: Glucose-Capillary: 306 mg/dL — ABNORMAL HIGH (ref 70–99)

## 2019-04-01 NOTE — Progress Notes (Signed)
[id number] - [place], [place] [address] AT [address]. [place] [address] Phone: [phone] Fax: [phone]              Your procedure is scheduled on Thurs., Sept. 3, 2020 from 7:30AM-8:30AM            Report to [facility] Entrance "A" at 5:30AM            Call this number if you have problems the morning of surgery:            [phone]             Remember:            Do not eat after midnight on Sept. 2nd            You may drink clear liquids until 3 hours (4:30AM) prior to surgery time.  Clear liquids allowed are:  Water, Juice (non-citric and without pulp), Carbonated beverages, Clear Tea, Black Coffee only, Plain Jell-O only, Gatorade and Plain Popsicles only                        Take these medicines the morning of surgery with A SIP OF WATER: AmLODipine (NORVASC) Loratadine (CLARITIN)      Omeprazole (PRILOSEC)  As of today, stop taking all Aspirin (unless instructed by your doctor) and Other Aspirin containing products, Vitamins, Fish oils, and Herbal medications. Also stop all NSAIDS i.e. Advil, Ibuprofen, Motrin, Aleve, Anaprox, Naproxen, BC, Goody Powders, and all Supplements.     THE NIGHT BEFORE SURGERY, take ______7.5_____ units of ____Levemir_______insulin.      (If you take in the morning still take 7.5 units)  If your Blood sugar is greater than 220 the morning of surgery you may take half of your sliding scale dose of Novolog (1-4 units). If it is NOT greater than 220 take NO Novolog insulin.  HOW TO MANAGE YOUR DIABETES BEFORE AND AFTER SURGERY  Why is it important to control my blood sugar before and after surgery?  Improving blood sugar levels before and after surgery helps healing and can limit problems.  A way of improving blood sugar control is eating a healthy diet by: ?  Eating less sugar and carbohydrates ?  Increasing activity/exercise ?  Talking with your  doctor about reaching your blood sugar goals  High blood sugars (greater than 180 mg/dL) can raise your risk of infections and slow your recovery, so you will need to focus on controlling your diabetes during the weeks before surgery.  Make sure that the doctor who takes care of your diabetes knows about your planned surgery including the date and location.  How do I manage my blood sugar before surgery?  Check your blood sugar at least 4 times a day, starting 2 days before surgery, to make sure that the level is not too high or low. ? Check your blood sugar the morning of your surgery when you wake up and every 2 hours until you get to the Short Stay unit.  If your blood sugar is less than 70 mg/dL, you will need to treat for low blood sugar: ? Do not take insulin. ? Treat a low blood sugar (less than 70 mg/dL) with  cup of clear juice (cranberry or apple), 4glucose tablets, OR glucose gel. Recheck blood sugar in 15 minutes after treatment (to make sure it is greater than 70 mg/dL). If  your blood sugar is not greater than 70 mg/dL on recheck, call [phone] ?  for further instructions. ? If your CBG is greater than 220 mg/dL, you may take  of your sliding scale (Novolog) dose of insulin.   If you are admitted to the hospital after surgery: ? Your blood sugar will be checked by the staff and you will probably be given insulin after surgery (instead of oral diabetes medicines) to make sure you have good blood sugar levels. ? The goal for blood sugar control after surgery is 80-180 mg/dl.  Reviewed and Endorsed by [facility] Patient Education Committee, August 2015   Special instructions:   [facility]- Preparing For Surgery  Before surgery, you can play an important role. Because skin is not sterile, your skin needs to be as free of germs as possible. You can reduce the number of germs on your skin by washing with CHG (chlorahexidine gluconate) Soap before surgery.  CHG is an  antiseptic cleaner which kills germs and bonds with the skin to continue killing germs even after washing.    Please do not use if you have an allergy to CHG or antibacterial soaps. If your skin becomes reddened/irritated stop using the CHG.  Do not shave (including legs and underarms) for at least 48 hours prior to first CHG shower. It is OK to shave your face.  Please follow these instructions carefully.                                                                                                                    1. Shower the NIGHT BEFORE SURGERY and the MORNING OF SURGERY with CHG.   2. If you chose to wash your hair, wash your hair first as usual with your normal shampoo.  3. After you shampoo, rinse your hair and body thoroughly to remove the shampoo.  4. Use CHG as you would any other liquid soap. You can apply CHG directly to the skin and wash gently with a scrungie or a clean washcloth.   5. Apply the CHG Soap to your body ONLY FROM THE NECK DOWN.  Do not use on open wounds or open sores. Avoid contact with your eyes, ears, mouth and genitals (private parts). Wash Face and genitals (private parts)  with your normal soap.  6. Wash thoroughly, paying special attention to the area where your surgery will be performed.  7. Thoroughly rinse your body with warm water from the neck down.  8. DO NOT shower/wash with your normal soap after using and rinsing off the CHG Soap.  9. Pat yourself dry with a CLEAN TOWEL.  10. Wear CLEAN PAJAMAS to bed the night before surgery, wear comfortable clothes the morning of surgery  11. Place CLEAN SHEETS on your bed the night of your first shower and DO NOT SLEEP WITH PETS.   Day of Surgery:             Remember to brush your teeth WITH YOUR REGULAR TOOTHPASTE.  Do not wear jewelry.            Do not wear lotions, powders, colognes, or deodorant.            Men may shave face and neck.            Do not bring  valuables to the hospital.            [facility] is not responsible for any belongings or valuables.  Contacts, dentures or bridgework may not be worn into surgery.   For patients admitted to the hospital, discharge time will be determined by your treatment team.  Patients discharged the day of surgery will not be allowed to drive home.   Please wear clean clothes to the hospital/surgery center.    Please read over the following fact sheets that you were given.

## 2019-04-01 NOTE — Progress Notes (Addendum)
PCP - Alysia Penna, MD Endocrinologist: Jama Flavors, MD Cardiologist -  Pt denies  Chest x-ray - n/a EKG - 04/01/2019 at PAT appt  Stress Test - pt denies ECHO - pt denies  Cardiac Cath - pt denies  Sleep Study - pt denies CPAP - n/a  Fasting Blood Sugar - 150-200 Checks Blood Sugar 2 times a day  Blood Thinner Instructions: n/a Aspirin Instructions: n/a  Anesthesia review: Pt's CBG is 300,  He reports that he had a soft drink with sugar and peanut butter crackers about 30 minutes ago. He saw Dr. Chalmers Cater about 3 months ago.  Advised him we will check his A1C today and if elevated there is a risk his surgery will be cancelled, he verbalized understanding..A1C 9.4, EKG  Patient denies shortness of breath, fever, cough and chest pain at PAT appointment  Patient verbalized understanding of instructions that were given to them at the PAT appointment. Patient was also instructed that they will need to review over the PAT instructions again at home before surgery.   Coronavirus Screening  Have you experienced the following symptoms:  Cough yes/no: No Fever (>100.72F)  yes/no: No Runny nose yes/no: No Sore throat yes/no: No Difficulty breathing/shortness of breath  yes/no: No  Have you or a family member traveled in the last 14 days and where? yes/no: No   If the patient indicates "YES" to the above questions, their PAT will be rescheduled to limit the exposure to others and, the surgeon will be notified. THE PATIENT WILL NEED TO BE ASYMPTOMATIC FOR 14 DAYS.   If the patient is not experiencing any of these symptoms, the PAT nurse will instruct them to NOT bring anyone with them to their appointment since they may have these symptoms or traveled as well.   Please remind your patients and families that hospital visitation restrictions are in effect and the importance of the restrictions.

## 2019-04-02 NOTE — Anesthesia Preprocedure Evaluation (Addendum)
Anesthesia Evaluation  Patient identified by MRN, date of birth, ID band Patient awake    Reviewed: Allergy & Precautions, NPO status , Patient's Chart, lab work & pertinent test results  History of Anesthesia Complications Negative for: history of anesthetic complications  Airway Mallampati: II  TM Distance: >3 FB Neck ROM: Full    Dental  (+) Edentulous Upper, Missing,    Pulmonary neg pulmonary ROS,    Pulmonary exam normal        Cardiovascular hypertension, Pt. on medications Normal cardiovascular exam     Neuro/Psych Depression S/p C4-7 ACDF 2013    GI/Hepatic Neg liver ROS, hiatal hernia, GERD  Medicated and Controlled,Hx of pancreatic ca   Endo/Other  diabetes, Type 2, Insulin Dependent  Renal/GU negative Renal ROS     Musculoskeletal  (+) Arthritis ,   Abdominal   Peds  Hematology negative hematology ROS (+)   Anesthesia Other Findings Day of surgery medications reviewed with the patient.  Reproductive/Obstetrics                           Anesthesia Physical Anesthesia Plan  ASA: II  Anesthesia Plan: MAC   Post-op Pain Management:    Induction:   PONV Risk Score and Plan: Treatment may vary due to age or medical condition and Propofol infusion  Airway Management Planned: Natural Airway and Simple Face Mask  Additional Equipment:   Intra-op Plan:   Post-operative Plan:   Informed Consent: I have reviewed the patients History and Physical, chart, labs and discussed the procedure including the risks, benefits and alternatives for the proposed anesthesia with the patient or authorized representative who has indicated his/her understanding and acceptance.     Dental advisory given  Plan Discussed with: CRNA  Anesthesia Plan Comments: (PAT note written 04/02/2019 by Myra Gianotti, PA-C. )      Anesthesia Quick Evaluation

## 2019-04-02 NOTE — Progress Notes (Signed)
Anesthesia Chart Review:  Case: [id number] Date/Time: 04/03/19 0715   Procedure: PORT REMOVAL (N/A )   Anesthesia type: Monitor Anesthesia Care   Pre-op diagnosis: PANCREATIC CANCER   Location: [facility] OR ROOM 02 / [place] [facility]   Surgeon: Stark Klein, MD      DISCUSSION: Patient is a 63 year old male scheduled for the above procedure. He is receiving surveillance therapy for pancreatic cancer following Whipple-type procedure and completed chemotherapy 02/2017.  History includes never smoker, HLD, HTN, GERD, pancreatic cancer (diagnosed 02/2016; s/p chemoradiation followed by Whipple-type procedure 11/17/16, chemotherapy thru 03/02/17), DM2, spinal cord injury (following fall 10/26/10; s/p C4-7 ACDF 08/18/11, only residual left hand achiness), melanoma (s/p excision, RLE 1999). S/p Left [place] [employer] Port 03/22/16. Coronary calcifications on recent chest CT (although noted on CT's dating back to 05/16/16).  A1c 9.4%. He reports fasting CBGs ~ 150-200. A1c result routed to Dr. Barry Dienes. On 04/02/19 fasting CBG 138.   03/31/19 COVID-19 test was negative.  His oncologist had patient notify his PCP Dr. Sarajane Jews regarding coronary calcifications on chest CT, so Dr. Sarajane Jews initiated a cardiology referral last month. Patient has not been contacted to schedule. I called and spoke with patient. He denied chest pain and SOB. He reported that he push mows his 1/4 acre yard regularly without CV symptoms. It takes him about an hour. No orthopnea, dizziness, syncope, edema, or palpitations.      I have communicated with Dr. Sarajane Jews. If patient remains asymptomatic then he feels okay for patient to undergo procedure as scheduled and see cardiology in the near future. Discussed with anesthesiologist Nolon Nations, MD as well. If CBG acceptable and otherwise no acute changes then it is anticipated that he can proceed as planned.    VS: BP (!) 146/71   Pulse 72   Temp 36.9 C   Resp 20   Ht 5\' 11"  (1.803 m)   Wt 96.1 kg   SpO2 99%   BMI 29.55  kg/m    PROVIDERS: [name], MD is PCP. Last visit 11/14/18.  Jama Flavors, MD is endocrinologist Truitt Merle, MD is HEM-ONC at [facility] and Shon Hough, MD is HEM-ONC at [facility].    LABS: Preoperative labs noted. DM not well controlled with A1c 9.4%. (all labs ordered are listed, but only abnormal results are displayed)  Labs Reviewed  GLUCOSE, CAPILLARY - Abnormal; Notable for the following components:      Result Value   Glucose-Capillary 306 (*)    All other components within normal limits  BASIC METABOLIC PANEL - Abnormal; Notable for the following components:   Glucose, Bld 306 (*)    Creatinine, Ser 0.59 (*)    All other components within normal limits  HEMOGLOBIN A1C - Abnormal; Notable for the following components:   Hgb A1c MFr Bld 9.4 (*)    All other components within normal limits  CBC     IMAGES: CT chest/abd/pelvis 02/06/19: IMPRESSION: 1. Status post Whipple procedure with no definitive findings to suggest metastatic disease in the chest, abdomen or pelvis. 2. Stable tiny pulmonary nodules in the right upper lobe compared to prior examinations, strongly favored to be benign. 3. Aortic atherosclerosis, in addition to left main and 2 vessel coronary artery disease. Please note that although the presence of coronary artery calcium documents the presence of coronary artery disease, the severity of this disease and any potential stenosis cannot be assessed on this non-gated CT examination. Assessment for potential risk factor modification,  dietary therapy or pharmacologic therapy may be warranted, if clinically indicated. 4. Additional incidental findings, as above. [See full report under Results Review tab]   EKG: 04/01/19: Normal sinus rhythm Minimal voltage criteria for LVH, may be normal variant Borderline ECG Confirmed by Loralie Champagne ([id number]) on 04/01/2019 4:55:15 PM   CV: Denies.   Past Medical History:  Diagnosis Date  .  Arthritis    left hand  . Bronchitis 1977  . Cancer ([place]) 03/09/2016   pancreatic cancer, sees Dr. Cristino Martes at [facility]   . Depression    takes Cymbalta daily  . Diabetes mellitus type II    sees Dr. Chalmers Cater   . GERD (gastroesophageal reflux disease)    takes Omeprazole daily  . H/O hiatal hernia   . Hyperlipidemia    takes Zocor daily  . Hypertension    takes Amlodipine daily  . Hypoglycemia 06/18/2017  . Neck pain    C4-7 stenosis and herniated disc  . Neuromuscular disorder ([place])    hiatal hernia  . Scoliosis    slight  . Spinal cord injury, C5-C7 ([place])    c4-c7  . Stiffness of hand joint    d/t cervical issues    Past Surgical History:  Procedure Laterality Date  . ANTERIOR CERVICAL DECOMP/DISCECTOMY FUSION  08/18/2011   Procedure: ANTERIOR CERVICAL DECOMPRESSION/DISCECTOMY FUSION 3 LEVELS;  Surgeon: Winfield Cunas, MD;  Location: [place] [facility];  Service: Neurosurgery;  Laterality: N/A;  Anterior Cervical Four-Five/Five-Six/Six-Seven Decompression with Fusion, Plating, and Bonegraft  . CARPAL TUNNEL RELEASE  2013   bilateral, per Dr. Christella Noa   . COLONOSCOPY  10-30-14   per Dr. Olevia Perches, clear, repeat in 10 yrs   . egd with esophageal dilation  9-08   per Dr. Olevia Perches  . ERCP N/A 03/01/2016   Procedure: ENDOSCOPIC RETROGRADE CHOLANGIOPANCREATOGRAPHY (ERCP) with brushings and stent;  Surgeon: Doran Stabler, MD;  Location: [facility];  Service: Endoscopy;  Laterality: N/A;  . EUS N/A 03/09/2016   Procedure: ESOPHAGEAL ENDOSCOPIC ULTRASOUND (EUS) RADIAL;  Surgeon: Milus Banister, MD;  Location: [facility];  Service: Endoscopy;  Laterality: N/A;  . lymph nodes biopsy    . melanoma rt calf  1999  . PORTACATH PLACEMENT Left 03/22/2016   Procedure: INSERTION PORT-A-CATH;  Surgeon: Stark Klein, MD;  Location: [facility]S;  Service: General;  Laterality: Left;  . SPINE SURGERY    . TONSILLECTOMY     as a child  . ULNAR TUNNEL RELEASE  2013   right arm, per Dr. Christella Noa   . UPPER  GASTROINTESTINAL ENDOSCOPY    . WHIPPLE PROCEDURE N/A 09/19/2016   Procedure: DIAGNOSTIC LAPAROSCOPY, LAPAROSCOPIC LIVER BIOPSY, RETROPERITONEAL EXPLORATION, INTRAOPERATIVE ULTRASOUND;  Surgeon: Stark Klein, MD;  Location: [place];  Service: General;  Laterality: N/A;    MEDICATIONS: . amLODipine (NORVASC) 10 MG tablet  . benazepril (LOTENSIN) 10 MG tablet  . Calcium Citrate-Vitamin D (CALCIUM + D PO)  . CREON 36000 units CPEP capsule  . diphenhydrAMINE (BENADRYL) 25 MG tablet  . ferrous sulfate 325 (65 FE) MG tablet  . FREESTYLE LITE test strip  . Insulin Detemir (LEVEMIR) 100 UNIT/ML Pen  . Insulin Pen Needle 30G X 5 MM MISC  . KLOR-CON M20 20 MEQ tablet  . Lancets (FREESTYLE) lancets  . lidocaine-prilocaine (EMLA) cream  . loratadine (CLARITIN) 10 MG tablet  . Multiple Vitamin (MULTIVITAMIN WITH MINERALS) TABS tablet  . naproxen sodium (ANAPROX) 220 MG tablet  . NOVOLOG FLEXPEN 100 UNIT/ML FlexPen  .  Omega-3 Fatty Acids (FISH OIL) 500 MG CAPS  . omeprazole (PRILOSEC) 20 MG capsule   No current facility-administered medications for this encounter.    . sodium chloride 0.9 % injection 10 mL    Myra Gianotti, PA-C Surgical Short Stay/Anesthesiology [facility] Phone [phone] [facility] Phone [phone] 04/02/2019 10:23 AM

## 2019-04-03 ENCOUNTER — Other Ambulatory Visit: Payer: Self-pay

## 2019-04-03 ENCOUNTER — Ambulatory Visit (HOSPITAL_COMMUNITY): Payer: Medicare Other | Admitting: Anesthesiology

## 2019-04-03 ENCOUNTER — Ambulatory Visit (HOSPITAL_COMMUNITY)
Admission: RE | Admit: 2019-04-03 | Discharge: 2019-04-03 | Disposition: A | Discharge status: Home / Self Care | Payer: Medicare Other | Attending: General Surgery | Admitting: General Surgery

## 2019-04-03 ENCOUNTER — Ambulatory Visit (HOSPITAL_COMMUNITY): Payer: Medicare Other | Admitting: Vascular Surgery

## 2019-04-03 ENCOUNTER — Encounter (HOSPITAL_COMMUNITY): Payer: Self-pay | Admitting: *Deleted

## 2019-04-03 ENCOUNTER — Encounter (HOSPITAL_COMMUNITY)
Admission: RE | Disposition: A | Discharge status: Home / Self Care | Payer: Self-pay | Source: Home / Self Care | Attending: General Surgery

## 2019-04-03 DIAGNOSIS — C259 Malignant neoplasm of pancreas, unspecified: Secondary | ICD-10-CM | POA: Diagnosis not present

## 2019-04-03 DIAGNOSIS — I10 Essential (primary) hypertension: Secondary | ICD-10-CM | POA: Diagnosis not present

## 2019-04-03 DIAGNOSIS — E785 Hyperlipidemia, unspecified: Secondary | ICD-10-CM | POA: Insufficient documentation

## 2019-04-03 DIAGNOSIS — M542 Cervicalgia: Secondary | ICD-10-CM | POA: Insufficient documentation

## 2019-04-03 DIAGNOSIS — Z981 Arthrodesis status: Secondary | ICD-10-CM | POA: Insufficient documentation

## 2019-04-03 DIAGNOSIS — K449 Diaphragmatic hernia without obstruction or gangrene: Secondary | ICD-10-CM | POA: Insufficient documentation

## 2019-04-03 DIAGNOSIS — K219 Gastro-esophageal reflux disease without esophagitis: Secondary | ICD-10-CM | POA: Diagnosis not present

## 2019-04-03 DIAGNOSIS — E119 Type 2 diabetes mellitus without complications: Secondary | ICD-10-CM | POA: Diagnosis not present

## 2019-04-03 DIAGNOSIS — Z4589 Encounter for adjustment and management of other implanted devices: Secondary | ICD-10-CM | POA: Diagnosis not present

## 2019-04-03 DIAGNOSIS — Z8249 Family history of ischemic heart disease and other diseases of the circulatory system: Secondary | ICD-10-CM | POA: Insufficient documentation

## 2019-04-03 DIAGNOSIS — M419 Scoliosis, unspecified: Secondary | ICD-10-CM | POA: Insufficient documentation

## 2019-04-03 DIAGNOSIS — Z85820 Personal history of malignant melanoma of skin: Secondary | ICD-10-CM | POA: Diagnosis not present

## 2019-04-03 DIAGNOSIS — F329 Major depressive disorder, single episode, unspecified: Secondary | ICD-10-CM | POA: Diagnosis not present

## 2019-04-03 DIAGNOSIS — Z452 Encounter for adjustment and management of vascular access device: Secondary | ICD-10-CM | POA: Diagnosis not present

## 2019-04-03 DIAGNOSIS — Z820 Family history of epilepsy and other diseases of the nervous system: Secondary | ICD-10-CM | POA: Insufficient documentation

## 2019-04-03 DIAGNOSIS — Z9889 Other specified postprocedural states: Secondary | ICD-10-CM | POA: Insufficient documentation

## 2019-04-03 DIAGNOSIS — Z8507 Personal history of malignant neoplasm of pancreas: Secondary | ICD-10-CM | POA: Insufficient documentation

## 2019-04-03 DIAGNOSIS — Z79899 Other long term (current) drug therapy: Secondary | ICD-10-CM | POA: Insufficient documentation

## 2019-04-03 DIAGNOSIS — M19042 Primary osteoarthritis, left hand: Secondary | ICD-10-CM | POA: Insufficient documentation

## 2019-04-03 DIAGNOSIS — Z833 Family history of diabetes mellitus: Secondary | ICD-10-CM | POA: Insufficient documentation

## 2019-04-03 DIAGNOSIS — Z794 Long term (current) use of insulin: Secondary | ICD-10-CM | POA: Insufficient documentation

## 2019-04-03 DIAGNOSIS — Z823 Family history of stroke: Secondary | ICD-10-CM | POA: Insufficient documentation

## 2019-04-03 HISTORY — PX: PORT-A-CATH REMOVAL: SHX5289

## 2019-04-03 LAB — GLUCOSE, CAPILLARY
Glucose-Capillary: 234 mg/dL — ABNORMAL HIGH (ref 70–99)
Glucose-Capillary: 223 mg/dL — ABNORMAL HIGH (ref 70–99)

## 2019-04-03 SURGERY — REMOVAL PORT-A-CATH
Anesthesia: Monitor Anesthesia Care | Site: Chest

## 2019-04-03 MED ORDER — GABAPENTIN 300 MG PO CAPS
ORAL_CAPSULE | ORAL | Status: AC
  Administered 2019-04-03: 300 mg via ORAL
  Filled 2019-04-03: qty 1

## 2019-04-03 MED ORDER — ACETAMINOPHEN 500 MG PO TABS
ORAL_TABLET | ORAL | Status: AC
  Administered 2019-04-03: 1000 mg via ORAL
  Filled 2019-04-03: qty 2

## 2019-04-03 MED ORDER — CEFAZOLIN SODIUM-DEXTROSE 2-4 GM/100ML-% IV SOLN
2.0000 g | INTRAVENOUS | Status: AC
  Administered 2019-04-03: 2 g via INTRAVENOUS

## 2019-04-03 MED ORDER — 0.9 % SODIUM CHLORIDE (POUR BTL) OPTIME
TOPICAL | Status: DC | PRN
  Administered 2019-04-03: 1000 mL

## 2019-04-03 MED ORDER — LIDOCAINE HCL (PF) 1 % IJ SOLN
INTRAMUSCULAR | Status: AC
  Filled 2019-04-03: qty 30

## 2019-04-03 MED ORDER — CHLORHEXIDINE GLUCONATE CLOTH 2 % EX PADS: 6.0000 | MEDICATED_PAD | Freq: Once | CUTANEOUS | Status: DC

## 2019-04-03 MED ORDER — ACETAMINOPHEN 500 MG PO TABS
1000.0000 mg | ORAL_TABLET | ORAL | Status: AC
  Administered 2019-04-03: 07:00:00 1000 mg via ORAL

## 2019-04-03 MED ORDER — LIDOCAINE HCL 1 % IJ SOLN
INTRAMUSCULAR | Status: DC | PRN
  Administered 2019-04-03: 15 mL

## 2019-04-03 MED ORDER — FENTANYL CITRATE (PF) 250 MCG/5ML IJ SOLN
INTRAMUSCULAR | Status: AC
  Filled 2019-04-03: qty 5

## 2019-04-03 MED ORDER — OXYCODONE HCL 5 MG PO TABS: 5.0000 mg | ORAL_TABLET | Freq: Four times a day (QID) | ORAL | 0 refills | Status: DC | PRN

## 2019-04-03 MED ORDER — CEFAZOLIN SODIUM-DEXTROSE 2-4 GM/100ML-% IV SOLN
INTRAVENOUS | Status: AC
  Filled 2019-04-03: qty 100

## 2019-04-03 MED ORDER — ONDANSETRON HCL 4 MG/2ML IJ SOLN
INTRAMUSCULAR | Status: AC
  Filled 2019-04-03: qty 2

## 2019-04-03 MED ORDER — MIDAZOLAM HCL 5 MG/5ML IJ SOLN
INTRAMUSCULAR | Status: DC | PRN
  Administered 2019-04-03: 2 mg via INTRAVENOUS

## 2019-04-03 MED ORDER — ONDANSETRON HCL 4 MG/2ML IJ SOLN
INTRAMUSCULAR | Status: DC | PRN
  Administered 2019-04-03: 4 mg via INTRAVENOUS

## 2019-04-03 MED ORDER — PROPOFOL 10 MG/ML IV BOLUS
INTRAVENOUS | Status: AC
  Filled 2019-04-03: qty 40

## 2019-04-03 MED ORDER — BUPIVACAINE-EPINEPHRINE (PF) 0.25% -1:200000 IJ SOLN
INTRAMUSCULAR | Status: AC
  Filled 2019-04-03: qty 30

## 2019-04-03 MED ORDER — PROPOFOL 500 MG/50ML IV EMUL
INTRAVENOUS | Status: DC | PRN
  Administered 2019-04-03: 75 ug/kg/min via INTRAVENOUS

## 2019-04-03 MED ORDER — LACTATED RINGERS IV SOLN
INTRAVENOUS | Status: DC | PRN
  Administered 2019-04-03: 08:00:00 via INTRAVENOUS

## 2019-04-03 MED ORDER — GABAPENTIN 300 MG PO CAPS
300.0000 mg | ORAL_CAPSULE | ORAL | Status: AC
  Administered 2019-04-03: 07:00:00 300 mg via ORAL

## 2019-04-03 MED ORDER — MIDAZOLAM HCL 2 MG/2ML IJ SOLN
INTRAMUSCULAR | Status: AC
  Filled 2019-04-03: qty 2

## 2019-04-03 SURGICAL SUPPLY — 31 items
ADH SKN CLS APL DERMABOND .7 (GAUZE/BANDAGES/DRESSINGS) ×1
CHLORAPREP W/TINT 10.5 ML (MISCELLANEOUS) ×2 IMPLANT
COVER SURGICAL LIGHT HANDLE (MISCELLANEOUS) ×2 IMPLANT
COVER WAND RF STERILE (DRAPES) ×2 IMPLANT
DECANTER SPIKE VIAL GLASS SM (MISCELLANEOUS) ×4 IMPLANT
DERMABOND ADVANCED (GAUZE/BANDAGES/DRESSINGS) ×1
DERMABOND ADVANCED .7 DNX12 (GAUZE/BANDAGES/DRESSINGS) ×1 IMPLANT
DRAPE LAPAROTOMY 100X72 PEDS (DRAPES) ×2 IMPLANT
ELECT CAUTERY BLADE 6.4 (BLADE) ×2 IMPLANT
ELECT REM PT RETURN 9FT ADLT (ELECTROSURGICAL) ×2
ELECTRODE REM PT RTRN 9FT ADLT (ELECTROSURGICAL) ×1 IMPLANT
GAUZE 4X4 16PLY RFD (DISPOSABLE) ×2 IMPLANT
GLOVE BIO SURGEON STRL SZ 6 (GLOVE) ×2 IMPLANT
GLOVE INDICATOR 6.5 STRL GRN (GLOVE) ×2 IMPLANT
GOWN STRL REUS W/ TWL LRG LVL3 (GOWN DISPOSABLE) ×1 IMPLANT
GOWN STRL REUS W/TWL 2XL LVL3 (GOWN DISPOSABLE) ×2 IMPLANT
GOWN STRL REUS W/TWL LRG LVL3 (GOWN DISPOSABLE) ×2
KIT BASIN OR (CUSTOM PROCEDURE TRAY) ×2 IMPLANT
KIT TURNOVER KIT B (KITS) ×2 IMPLANT
NDL HYPO 25GX1X1/2 BEV (NEEDLE) ×1 IMPLANT
NEEDLE HYPO 25GX1X1/2 BEV (NEEDLE) ×2 IMPLANT
NS IRRIG 1000ML POUR BTL (IV SOLUTION) ×2 IMPLANT
PACK GENERAL/GYN (CUSTOM PROCEDURE TRAY) ×2 IMPLANT
PAD ARMBOARD 7.5X6 YLW CONV (MISCELLANEOUS) ×4 IMPLANT
PENCIL SMOKE EVACUATOR (MISCELLANEOUS) ×1 IMPLANT
SUT MON AB 4-0 PC3 18 (SUTURE) ×2 IMPLANT
SUT VIC AB 3-0 SH 27 (SUTURE) ×2
SUT VIC AB 3-0 SH 27X BRD (SUTURE) ×1 IMPLANT
SYR CONTROL 10ML LL (SYRINGE) ×2 IMPLANT
TOWEL GREEN STERILE (TOWEL DISPOSABLE) ×2 IMPLANT
TOWEL GREEN STERILE FF (TOWEL DISPOSABLE) ×2 IMPLANT

## 2019-04-03 NOTE — Anesthesia Postprocedure Evaluation (Signed)
Anesthesia Post Note  Patient: Colton Bush  Procedure(s) Performed: PORT REMOVAL (N/A Chest)     Patient location during evaluation: PACU Anesthesia Type: MAC Level of consciousness: awake and alert and oriented Pain management: pain level controlled Vital Signs Assessment: post-procedure vital signs reviewed and stable Respiratory status: spontaneous breathing, nonlabored ventilation and respiratory function stable Cardiovascular status: blood pressure returned to baseline Postop Assessment: no apparent nausea or vomiting Anesthetic complications: no    Last Vitals:  Vitals:   04/03/19 0854 04/03/19 0923  BP:  118/72  Pulse: 73 70  Resp: 15 14  Temp: (!) 36.2 C   SpO2: 96% 96%    Last Pain:  Vitals:   04/03/19 0854  TempSrc:   PainSc: 0-No pain                 Brennan Bailey

## 2019-04-03 NOTE — Transfer of Care (Signed)
Immediate Anesthesia Transfer of Care Note  Patient: Colton Bush  Procedure(s) Performed: PORT REMOVAL (N/A Chest)  Patient Location: PACU  Anesthesia Type:MAC  Level of Consciousness: drowsy  Airway & Oxygen Therapy: Patient Spontanous Breathing and Patient connected to face mask oxygen  Post-op Assessment: Report given to RN and Post -op Vital signs reviewed and stable  Post vital signs: Reviewed and stable  Last Vitals:  Vitals Value Taken Time  BP 129/65 04/03/19 0823  Temp    Pulse 71 04/03/19 0826  Resp 24 04/03/19 0826  SpO2 97 % 04/03/19 0826  Vitals shown include unvalidated device data.  Last Pain:  Vitals:   04/03/19 0644  TempSrc: Oral  PainSc:          Complications: No apparent anesthesia complications

## 2019-04-03 NOTE — Op Note (Signed)
  PRE-OPERATIVE DIAGNOSIS:  un-needed Port-A-Cath for pancreatic cancer  POST-OPERATIVE DIAGNOSIS:  Same   PROCEDURE:  Procedure(s):  REMOVAL PORT-A-CATH  SURGEON:  Surgeon(s):  Stark Klein, MD  ANESTHESIA:   MAC + local  EBL:   Minimal  SPECIMEN:  None  Complications : none known  Procedure:   Pt was  identified in the holding area and taken to the operating room where she was placed supine on the operating room table.  MAC anesthesia was induced.  The left upper chest was prepped and draped.  The prior incision was anesthetized with local anesthetic.  The incision was opened with a #15 blade.  The subcutaneous tissue was divided with the cautery.  The port was identified and the capsule opened.  The four 2-0 prolene sutures were removed.  The port was then removed and pressure held on the tract.  The catheter appeared intact without evidence of breakage, length was 24 cm.  The wound was inspected for hemostasis, which was achieved with cautery.  The wound was closed with 3-0 vicryl deep dermal interrupted sutures and 4-0 Monocryl running subcuticular suture.  The wound was cleaned, dried, and dressed with dermabond.  The patient was awakened from anesthesia and taken to the PACU in stable condition.  Needle, sponge, and instrument counts are correct.

## 2019-04-03 NOTE — Anesthesia Procedure Notes (Signed)
Procedure Name: MAC Performed by: [name] B, CRNA Pre-anesthesia Checklist: Patient identified, Emergency Drugs available, Suction available, Patient being monitored and Timeout performed Patient Re-evaluated:Patient Re-evaluated prior to induction Oxygen Delivery Method: Simple face mask Preoxygenation: Pre-oxygenation with 100% oxygen Induction Type: IV induction Placement Confirmation: positive ETCO2 Dental Injury: Teeth and Oropharynx as per pre-operative assessment        

## 2019-04-03 NOTE — H&P (Signed)
Colton Bush is an 63 y.o. male.   Chief Complaint: port in place HPI: Pt is a 63 yo M s/o chemo followed by whipple for pancreatic cancer 2017-2018.  No evidence of disease since then.  Desires port removal.    Past Medical History:  Diagnosis Date  . Arthritis    left hand  . Bronchitis 1977  . Cancer ([place]) 03/09/2016   pancreatic cancer, sees Dr. Cristino Martes at [facility]   . Depression    takes Cymbalta daily  . Diabetes mellitus type II    sees Dr. Chalmers Cater   . GERD (gastroesophageal reflux disease)    takes Omeprazole daily  . H/O hiatal hernia   . Hyperlipidemia    takes Zocor daily  . Hypertension    takes Amlodipine daily  . Hypoglycemia 06/18/2017  . Neck pain    C4-7 stenosis and herniated disc  . Neuromuscular disorder ([place])    hiatal hernia  . Scoliosis    slight  . Spinal cord injury, C5-C7 ([place])    c4-c7  . Stiffness of hand joint    d/t cervical issues    Past Surgical History:  Procedure Laterality Date  . ANTERIOR CERVICAL DECOMP/DISCECTOMY FUSION  08/18/2011   Procedure: ANTERIOR CERVICAL DECOMPRESSION/DISCECTOMY FUSION 3 LEVELS;  Surgeon: Winfield Cunas, MD;  Location: [place] [facility];  Service: Neurosurgery;  Laterality: N/A;  Anterior Cervical Four-Five/Five-Six/Six-Seven Decompression with Fusion, Plating, and Bonegraft  . CARPAL TUNNEL RELEASE  2013   bilateral, per Dr. Christella Noa   . COLONOSCOPY  10-30-14   per Dr. Olevia Perches, clear, repeat in 10 yrs   . egd with esophageal dilation  9-08   per Dr. Olevia Perches  . ERCP N/A 03/01/2016   Procedure: ENDOSCOPIC RETROGRADE CHOLANGIOPANCREATOGRAPHY (ERCP) with brushings and stent;  Surgeon: Doran Stabler, MD;  [facility];  Service: Endoscopy;  Laterality: N/A;  . EUS N/A 03/09/2016   Procedure: ESOPHAGEAL ENDOSCOPIC ULTRASOUND (EUS) RADIAL;  Surgeon: Milus Banister, MD;  [facility];  Service: Endoscopy;  Laterality: N/A;  . lymph nodes biopsy    . melanoma rt calf  1999  . PORTACATH PLACEMENT Left  03/22/2016   Procedure: INSERTION PORT-A-CATH;  Surgeon: Stark Klein, MD;  Location[facility];  Service: General;  Laterality: Left;  . SPINE SURGERY    . TONSILLECTOMY     as a child  . ULNAR TUNNEL RELEASE  2013   right arm, per Dr. Christella Noa   . UPPER GASTROINTESTINAL ENDOSCOPY    . WHIPPLE PROCEDURE N/A 09/19/2016   Procedure: DIAGNOSTIC LAPAROSCOPY, LAPAROSCOPIC LIVER BIOPSY, RETROPERITONEAL EXPLORATION, INTRAOPERATIVE ULTRASOUND;  Surgeon: Stark Klein, MD;  Location: [facility];  Service: General;  Laterality: N/A;    Family History  Problem Relation Age of Onset  . Heart disease Father   . Heart disease Brother 63  . Anesthesia problems Mother   . Heart disease Mother   . Dementia Mother   . Diabetes Sister   . Stroke Sister   . Colon cancer Neg Hx   . Rectal cancer Neg Hx   . Stomach cancer Neg Hx    Social History:  reports that he has never smoked. He has never used smokeless tobacco. He reports that he does not drink alcohol or use drugs.  Allergies: No Known Allergies  Medications Prior to Admission  Medication Sig Dispense Refill  . amLODipine (NORVASC) 10 MG tablet TAKE 1 TABLET BY MOUTH EVERY DAY (Patient taking differently: Take 10 mg by mouth  daily. ) 90 tablet 3  . benazepril (LOTENSIN) 10 MG tablet TAKE 1 TABLET BY MOUTH EVERY DAY (Patient taking differently: Take 10 mg by mouth daily. ) 90 tablet 0  . Calcium Citrate-Vitamin D (CALCIUM + D PO) Take 1 tablet by mouth daily.    Marland Kitchen CREON 36000 units CPEP capsule Take 2 capsules by mouth 3 (three) times daily before meals.   3  . ferrous sulfate 325 (65 FE) MG tablet Take 325 mg daily with breakfast by mouth.    . Insulin Detemir (LEVEMIR) 100 UNIT/ML Pen Inject 15 Units into the skin daily at 10 pm. (Patient taking differently: Inject 15 Units into the skin daily. ) 90 mL 3  . lidocaine-prilocaine (EMLA) cream Apply to affected area once (Patient taking differently: Apply 1 application topically daily as needed (port  access). ) 30 g 3  . loratadine (CLARITIN) 10 MG tablet Take 10 mg by mouth daily.    . Multiple Vitamin (MULTIVITAMIN WITH MINERALS) TABS tablet Take 1 tablet by mouth daily.    . naproxen sodium (ANAPROX) 220 MG tablet Take 440 mg by mouth 2 (two) times daily as needed (for pain.).    Marland Kitchen NOVOLOG FLEXPEN 100 UNIT/ML FlexPen GIVE EVERY MORNING WITH BREAKFAST AND EVERY EVENING WITH SUPPER PER SLIDING SCALE (Patient taking differently: Inject 3-9 Units into the skin 3 (three) times daily with meals. ) 3 mL 0  . Omega-3 Fatty Acids (FISH OIL) 500 MG CAPS Take 500 mg by mouth daily.    Marland Kitchen omeprazole (PRILOSEC) 20 MG capsule TAKE 1 CAPSULE BY MOUTH EVERY DAY (Patient taking differently: Take 20 mg by mouth daily. ) 90 capsule 0  . diphenhydrAMINE (BENADRYL) 25 MG tablet Take 25 mg by mouth at bedtime as needed for sleep.    Marland Kitchen FREESTYLE LITE test strip USE ONE STRIP TO CHECK GLUCOSE ONCE DAILY. PLEASE  SCHEDULE FOLLOW UP 50 each 0  . Insulin Pen Needle 30G X 5 MM MISC Use one daily with insulin 100 each 2  . KLOR-CON M20 20 MEQ tablet TAKE 1 TABLET BY MOUTH EVERY DAY (Patient not taking: Reported on 03/31/2019) 90 tablet 3  . Lancets (FREESTYLE) lancets USE AS INSTRUCTED TO CHECK BLOOD SUGAR ONCE A DAY 100 each 2    Results for orders placed or performed during the hospital encounter of 04/03/19 (from the past 48 hour(s))  Glucose, capillary     Status: Abnormal   Collection Time: 04/03/19  6:52 AM  Result Value Ref Range   Glucose-Capillary 234 (H) 70 - 99 mg/dL   No results found.  Review of Systems  All other systems reviewed and are negative.   Blood pressure (!) 164/78, pulse 65, temperature 97.6 F (36.4 C), temperature source Oral, resp. rate 16, SpO2 97 %. Physical Exam  Constitutional: He is oriented to person, place, and time. He appears well-developed and well-nourished. No distress.  HENT:  Head: Normocephalic and atraumatic.  Eyes: Conjunctivae are normal. No scleral icterus.   Neck: Neck supple.  Respiratory: Effort normal. No respiratory distress.  GI: Soft.  Musculoskeletal: Normal range of motion.  Neurological: He is alert and oriented to person, place, and time. Coordination normal.  Skin: Skin is warm and dry. He is not diaphoretic.  Psychiatric: He has a normal mood and affect. His behavior is normal. Judgment and thought content normal.     Assessment/Plan Port in place. H/o pancreatic cancer.  Plan port removal Discussed risks, recovery.    Stark Klein, MD  04/03/2019, 7:33 AM

## 2019-04-04 ENCOUNTER — Encounter (HOSPITAL_COMMUNITY): Payer: Self-pay | Admitting: General Surgery

## 2019-05-08 NOTE — Progress Notes (Signed)
Cardiology Office Note:   Date:  05/09/2019  NAME:  Colton Bush    MRN: [id number] DOB:  1955-10-16   PCP:  [name], MD  Cardiologist:  No primary care provider on file.  Electrophysiologist:  None   Referring MD: [name], MD   Chief Complaint  Patient presents with  . coronary calcium   History of Present Illness:   Colton Bush is a 63 y.o. male with a hx of pancreatic cancer (s/p whipple 10/2016, on surveillance), HTN who is being seen today for the evaluation of coronary calcificaiton at the request of [name], MD. CT chest for cancer surveillance 02/06/2019 showed coronary calcifications.  He is recently had several CT scans for cancer surveillance and found to have coronary calcium.  He reports he is here to be evaluated for this.  He reports no history of CAD himself.  He did undergo an extensive surgical procedure in 2018 without any complications.  He reports no symptoms of chest pain or trouble breathing with exertion.  He does describe some chest discomfort related to a previous port that has been removed.  However, he has no limitations with activity or exercise.  His EKG is rather unremarkable.  He does have a history of diabetes with A1c of 9.3.  His lipid profile from 2019 showed an LDL cholesterol of 71.  He is not on a statin.  He remains on aspirin therapy.  His blood pressure is a bit up today, but normally controlled.  He states that his diabetes is gotten a bit out of control during the coronavirus pandemic as he is mainly at home.  Past Medical History: Past Medical History:  Diagnosis Date  . Arthritis    left hand  . Bronchitis 1977  . Cancer ([place]) 03/09/2016   pancreatic cancer, sees Dr. Cristino Martes at [facility]   . Depression    takes Cymbalta daily  . Diabetes mellitus type II    sees Dr. Chalmers Cater   . GERD (gastroesophageal reflux disease)    takes Omeprazole daily  . H/O hiatal hernia   . Hyperlipidemia    takes Zocor daily  . Hypertension     takes Amlodipine daily  . Hypoglycemia 06/18/2017  . Neck pain    C4-7 stenosis and herniated disc  . Neuromuscular disorder ([place])    hiatal hernia  . Scoliosis    slight  . Spinal cord injury, C5-C7 ([place])    c4-c7  . Stiffness of hand joint    d/t cervical issues    Past Surgical History: Past Surgical History:  Procedure Laterality Date  . ANTERIOR CERVICAL DECOMP/DISCECTOMY FUSION  08/18/2011   Procedure: ANTERIOR CERVICAL DECOMPRESSION/DISCECTOMY FUSION 3 LEVELS;  Surgeon: Winfield Cunas, MD;  Location: [facility];  Service: Neurosurgery;  Laterality: N/A;  Anterior Cervical Four-Five/Five-Six/Six-Seven Decompression with Fusion, Plating, and Bonegraft  . CARPAL TUNNEL RELEASE  2013   bilateral, per Dr. Christella Noa   . COLONOSCOPY  10-30-14   per Dr. Olevia Perches, clear, repeat in 10 yrs   . egd with esophageal dilation  9-08   per Dr. Olevia Perches  . ERCP N/A 03/01/2016   Procedure: ENDOSCOPIC RETROGRADE CHOLANGIOPANCREATOGRAPHY (ERCP) with brushings and stent;  Surgeon: Doran Stabler, MD;  Location: [facility];  Service: Endoscopy;  Laterality: N/A;  . EUS N/A 03/09/2016   Procedure: ESOPHAGEAL ENDOSCOPIC ULTRASOUND (EUS) RADIAL;  Surgeon: Milus Banister, MD;  Location: [facility];  Service: Endoscopy;  Laterality: N/A;  .  lymph nodes biopsy    . melanoma rt calf  1999  . PORT-A-CATH REMOVAL N/A 04/03/2019   Procedure: PORT REMOVAL;  Surgeon: Stark Klein, MD;  Location: [facility];  Service: General;  Laterality: N/A;  . PORTACATH PLACEMENT Left 03/22/2016   Procedure: INSERTION PORT-A-CATH;  Surgeon: Stark Klein, MD;  Location: [facility];  Service: General;  Laterality: Left;  . SPINE SURGERY    . TONSILLECTOMY     as a child  . ULNAR TUNNEL RELEASE  2013   right arm, per Dr. Christella Noa   . UPPER GASTROINTESTINAL ENDOSCOPY    . WHIPPLE PROCEDURE N/A 09/19/2016   Procedure: DIAGNOSTIC LAPAROSCOPY, LAPAROSCOPIC LIVER BIOPSY, RETROPERITONEAL EXPLORATION, INTRAOPERATIVE ULTRASOUND;   Surgeon: Stark Klein, MD;  Location: [facility];  Service: General;  Laterality: N/A;    Current Medications: Current Meds  Medication Sig  . amLODipine (NORVASC) 10 MG tablet TAKE 1 TABLET BY MOUTH EVERY DAY (Patient taking differently: Take 10 mg by mouth daily. )  . benazepril (LOTENSIN) 10 MG tablet TAKE 1 TABLET BY MOUTH EVERY DAY (Patient taking differently: Take 10 mg by mouth daily. )  . Calcium Citrate-Vitamin D (CALCIUM + D PO) Take 1 tablet by mouth daily.  Marland Kitchen CREON 36000 units CPEP capsule Take 2 capsules by mouth 3 (three) times daily before meals.   . diphenhydrAMINE (BENADRYL) 25 MG tablet Take 25 mg by mouth at bedtime as needed for sleep.  . ferrous sulfate 325 (65 FE) MG tablet Take 325 mg daily with breakfast by mouth.  Marland Kitchen FREESTYLE LITE test strip USE ONE STRIP TO CHECK GLUCOSE ONCE DAILY. PLEASE  SCHEDULE FOLLOW UP  . Insulin Detemir (LEVEMIR) 100 UNIT/ML Pen Inject 15 Units into the skin daily at 10 pm. (Patient taking differently: Inject 15 Units into the skin daily. )  . Insulin Pen Needle 30G X 5 MM MISC Use one daily with insulin  . KLOR-CON M20 20 MEQ tablet TAKE 1 TABLET BY MOUTH EVERY DAY  . Lancets (FREESTYLE) lancets USE AS INSTRUCTED TO CHECK BLOOD SUGAR ONCE A DAY  . lidocaine-prilocaine (EMLA) cream Apply to affected area once (Patient taking differently: Apply 1 application topically daily as needed (port access). )  . loratadine (CLARITIN) 10 MG tablet Take 10 mg by mouth daily.  . Multiple Vitamin (MULTIVITAMIN WITH MINERALS) TABS tablet Take 1 tablet by mouth daily.  . naproxen sodium (ANAPROX) 220 MG tablet Take 440 mg by mouth 2 (two) times daily as needed (for pain.).  Marland Kitchen NOVOLOG FLEXPEN 100 UNIT/ML FlexPen GIVE EVERY MORNING WITH BREAKFAST AND EVERY EVENING WITH SUPPER PER SLIDING SCALE (Patient taking differently: Inject 3-9 Units into the skin 3 (three) times daily with meals. )  . Omega-3 Fatty Acids (FISH OIL) 500 MG CAPS Take 500 mg by mouth daily.  Marland Kitchen  omeprazole (PRILOSEC) 20 MG capsule TAKE 1 CAPSULE BY MOUTH EVERY DAY (Patient taking differently: Take 20 mg by mouth daily. )  . oxyCODONE (OXY IR/ROXICODONE) 5 MG immediate release tablet Take 1 tablet (5 mg total) by mouth every 6 (six) hours as needed for severe pain.     Allergies:    Patient has no known allergies.   Social History: Social History   Socioeconomic History  . Marital status: Married    Spouse name: [name]  . Number of children: 1  . Years of education: Not on file  . Highest education level: Not on file  Occupational History  . Occupation: Diplomatic Services operational officer  . Financial  resource strain: Not on file  . Food insecurity    Worry: Not on file    Inability: Not on file  . Transportation needs    Medical: Not on file    Non-medical: Not on file  Tobacco Use  . Smoking status: Never Smoker  . Smokeless tobacco: Never Used  . Tobacco comment: tried a pipe 35 years ago   Substance and Sexual Activity  . Alcohol use: No    Alcohol/week: 0.0 standard drinks  . Drug use: No  . Sexual activity: Yes  Lifestyle  . Physical activity    Days per week: Not on file    Minutes per session: Not on file  . Stress: Not on file  Relationships  . Social Herbalist on phone: Not on file    Gets together: Not on file    Attends religious service: Not on file    Active member of club or organization: Not on file    Attends meetings of clubs or organizations: Not on file    Relationship status: Not on file  Other Topics Concern  . Not on file  Social History Narrative   Married, wife [name]   [employer] Nutritional therapist-     Family History: The patient's family history includes Anesthesia problems in his mother; Dementia in his mother; Diabetes in his sister; Heart disease in his father and mother; Heart disease (age of onset: 54) in his brother; Stroke in his sister. There is no history of Colon cancer, Rectal cancer, or Stomach cancer.  ROS:   All other ROS  reviewed and negative. Pertinent positives noted in the HPI.     EKGs/Labs/Other Studies Reviewed:   The following studies were personally reviewed by me today:  Labs from 2019, HDL 31, LDL 71, triglycerides 139, A1c 9.4, creatinine 0.59, TSH 1.63  EKG:  EKG is ordered today.  The ekg ordered today demonstrates normal sinus rhythm, heart rate 70, normal intervals, no acute ST-T changes, no evidence of prior infarction, normal EKG, and was personally reviewed by me.   Recent Labs: 02/06/2019: ALT 32 04/01/2019: BUN 12; Creatinine, Ser 0.59; Hemoglobin 14.9; Platelets 306; Potassium 3.8; Sodium 137   Recent Lipid Panel    Component Value Date/Time   CHOL 126 11/01/2015 0944   TRIG 69.0 11/01/2015 0944   HDL 35.90 (L) 11/01/2015 0944   CHOLHDL 4 11/01/2015 0944   VLDL 13.8 11/01/2015 0944   LDLCALC 76 11/01/2015 0944   LDLDIRECT 138.7 08/12/2013 0920    Physical Exam:   VS:  BP (!) 144/76   Pulse 70   Temp 97.6 F (36.4 C)   Ht 5\' 11"  (1.803 m)   Wt 211 lb 9.6 oz (96 kg)   SpO2 95%   BMI 29.51 kg/m    Wt Readings from Last 3 Encounters:  05/09/19 211 lb 9.6 oz (96 kg)  04/01/19 211 lb 14.4 oz (96.1 kg)  09/16/18 212 lb 12.8 oz (96.5 kg)    General: Well nourished, well developed, in no acute distress Heart: Atraumatic, normal size  Eyes: PEERLA, EOMI  Neck: Supple, no JVD Endocrine: No thryomegaly Cardiac: Normal S1, S2; RRR; no murmurs, rubs, or gallops Lungs: Clear to auscultation bilaterally, no wheezing, rhonchi or rales  Abd: Soft, nontender, no hepatomegaly  Ext: No edema, pulses 2+ Musculoskeletal: No deformities, BUE and BLE strength normal and equal Skin: Warm and dry, no rashes   Neuro: Alert and oriented to person, place, time, and situation,  CNII-XII grossly intact, no focal deficits  Psych: Normal mood and affect   ASSESSMENT:   NAME@ is a 63 y.o. male who presents for the following: 1. Elevated coronary artery calcium score   2. Essential hypertension    3. Mixed hyperlipidemia   4. Coronary artery calcification    PLAN:   1. Elevated coronary artery calcium score 2. Essential hypertension 3. Mixed hyperlipidemia 4. Coronary artery calcification -He presents for evaluation of calcium in his coronary arteries seen on a CT scan.  He has no symptoms of angina to suggest he has underlying CAD.  He is relatively stable from a cardiovascular standpoint.  He is active and has no symptoms of chest pain or shortness of breath.  I think it is reasonable to start him on Crestor and continue on aspirin.  We will check his lipid profile in the next 1 to 2 weeks.  I think will be reasonable to get his LDL cholesterol as low as possible to reduce his risk of any cardiovascular disease.  We will see him back in about 3 months after he has been on Crestor we can reassess his LDL cholesterol and symptoms at that time.  Disposition: Return in about 3 months (around 08/09/2019).  Medication Adjustments/Labs and Tests Ordered: Current medicines are reviewed at length with the patient today.  Concerns regarding medicines are outlined above.  Orders Placed This Encounter  Procedures  . Lipid panel  . EKG 12-Lead   Meds ordered this encounter  Medications  . rosuvastatin (CRESTOR) 20 MG tablet    Sig: Take 1 tablet (20 mg total) by mouth daily.    Dispense:  90 tablet    Refill:  3    Patient Instructions  Medication Instructions:  START crestor 20mg  daily Continue other current medications If you need a refill on your cardiac medications before your next appointment, please call your pharmacy.   Lab work: FASTING lab work to check cholesterol in a few weeks  If you have labs (blood work) drawn today and your tests are completely normal, you will receive your results only by: Marland Kitchen MyChart Message (if you have MyChart) OR . A paper copy in the mail If you have any lab test that is abnormal or we need to change your treatment, we will call you to review the  results.  Testing/Procedures: NONE  Follow-Up: -- 3 months with Dr. Audie Box -- You will receive a reminder in [place] in November 2020 to call and schedule an appointment for January 2021      Signed, [place] T. Audie Box, [place]  [address] [place], [place] [address] [phone]  05/09/2019 4:29 PM

## 2019-05-09 ENCOUNTER — Ambulatory Visit (INDEPENDENT_AMBULATORY_CARE_PROVIDER_SITE_OTHER): Payer: Medicare Other | Admitting: Cardiovascular Disease

## 2019-05-09 ENCOUNTER — Other Ambulatory Visit: Payer: Self-pay

## 2019-05-09 ENCOUNTER — Encounter: Payer: Self-pay | Admitting: Cardiovascular Disease

## 2019-05-09 VITALS — BP 144/76 | HR 70 | Temp 97.6°F | Ht 71.0 in | Wt 211.6 lb

## 2019-05-09 DIAGNOSIS — I2510 Atherosclerotic heart disease of native coronary artery without angina pectoris: Secondary | ICD-10-CM | POA: Diagnosis not present

## 2019-05-09 DIAGNOSIS — E782 Mixed hyperlipidemia: Secondary | ICD-10-CM

## 2019-05-09 DIAGNOSIS — I10 Essential (primary) hypertension: Secondary | ICD-10-CM

## 2019-05-09 DIAGNOSIS — I2584 Coronary atherosclerosis due to calcified coronary lesion: Secondary | ICD-10-CM | POA: Diagnosis not present

## 2019-05-09 DIAGNOSIS — R931 Abnormal findings on diagnostic imaging of heart and coronary circulation: Secondary | ICD-10-CM

## 2019-05-09 MED ORDER — ROSUVASTATIN CALCIUM 20 MG PO TABS: 20.0000 mg | ORAL_TABLET | Freq: Every day | ORAL | 3 refills | Status: DC

## 2019-05-09 NOTE — Patient Instructions (Signed)
Medication Instructions:  START crestor 20mg  daily Continue other current medications If you need a refill on your cardiac medications before your next appointment, please call your pharmacy.   Lab work: FASTING lab work to check cholesterol in a few weeks  If you have labs (blood work) drawn today and your tests are completely normal, you will receive your results only by: Marland Kitchen MyChart Message (if you have MyChart) OR . A paper copy in the mail If you have any lab test that is abnormal or we need to change your treatment, we will call you to review the results.  Testing/Procedures: NONE  Follow-Up: -- 3 months with Dr. Audie Box -- You will receive a reminder in [place] in November 2020 to call and schedule an appointment for January 2021

## 2019-06-02 DIAGNOSIS — R931 Abnormal findings on diagnostic imaging of heart and coronary circulation: Secondary | ICD-10-CM | POA: Diagnosis not present

## 2019-06-02 DIAGNOSIS — E782 Mixed hyperlipidemia: Secondary | ICD-10-CM | POA: Diagnosis not present

## 2019-06-02 DIAGNOSIS — I2584 Coronary atherosclerosis due to calcified coronary lesion: Secondary | ICD-10-CM | POA: Diagnosis not present

## 2019-06-02 DIAGNOSIS — I2510 Atherosclerotic heart disease of native coronary artery without angina pectoris: Secondary | ICD-10-CM | POA: Diagnosis not present

## 2019-06-02 LAB — LIPID PANEL
Chol/HDL Ratio: 2.4 ratio (ref 0.0–5.0)
Cholesterol, Total: 74 mg/dL — ABNORMAL LOW (ref 100–199)
HDL: 31 mg/dL — ABNORMAL LOW (ref >39)
LDL Chol Calc (NIH): 27 mg/dL (ref 0–99)
Triglycerides: 77 mg/dL (ref 0–149)
VLDL Cholesterol Cal: 16 mg/dL (ref 5–40)

## 2019-06-11 NOTE — Progress Notes (Signed)
Colton Bush   [phone]) [phone] [phone]   Clinic Follow up Note   Patient Care Team: [name], MD as PCP - General  Date of Service:  06/13/2019  CHIEF COMPLAINT: Follow up pancreatic cancer  SUMMARY OF ONCOLOGIC HISTORY: Oncology History Overview Note  Cancer Staging Adenocarcinoma of head of pancreas [facility]) Staging form: Pancreas, AJCC 7th Edition - Clinical stage from 03/09/2016: Stage IIB (T2, N1, M0) - Signed by Truitt Merle, MD on 03/17/2016 - Pathologic stage from 11/17/2016: Stage IB (yT2, N0, cM0) - Signed by Truitt Merle, MD on 12/10/2016     Adenocarcinoma of head of pancreas ([place])  02/27/2016 Imaging   CT chest, abdomen and pelvis with contrast showed ill-defined heterogeneity of pancreatic head, highly suspicious for malignancy, ) quit about and biliary ductal dilatation, mildly prominent lymph nodes in the upper abdomen, largest 2 cm in the portocaval . No other metastasis.    03/09/2016 Initial Diagnosis   Adenocarcinoma of head of pancreas ([place])   03/09/2016 Procedure   Upper EUS showed a 3.5 cm irregular mass in the pancreatic head, causing pancreatic and biliary duct obstruction. The mass involves the portal vein 422 mm, strongly suggesting invasion. There is suspicious nearby adenopathy   03/09/2016 Initial Biopsy   Fine-needle aspiration of the pancreatic mass from EUS showed malignant cells consistent with adenocarcinoma.    03/28/2016 - 05/24/2016 Chemotherapy   neoadjuvant chemo with FOLFIRINOX every 2 weeks, for 5 cycles    06/28/2016 - 08/10/2016 Radiation Therapy   Neoadjuvant radiation by Dr. [name] Site/dose:   Pancreas treated to 45 Gy in 25 fractions. The Pancreas was then boosted to 54 Gy in 5 fractions.   06/28/2016 - 08/10/2016 Chemotherapy   Xeloda 2000 mg in the morning and 1500 mg in the evening, on the day of radiation.   09/04/2016 Imaging   CT Chest Abdomen Pelvis IMPRESSION: 1. Poorly defined pancreatic head  mass is grossly stable, with associated pancreatic ductal dilatation and portacaval adenopathy. No associated vascular encasement. 2. Common bile duct stent in place with stable biliary ductal dilatation. Left hepatic lobe atrophy. 3. Hepatomegaly.  Spleen is at the upper limits normal in size. 4. Aortic atherosclerosis (ICD10-170.0). Coronary artery calcification.   09/19/2016 Surgery   Diagnostic laparoscopy and possible whipple procedure by Dr. Barry Dienes. Whipple procedure aborted due to the pancreatic cancer involving SMV, SMA and portal veins.   09/19/2016 Pathology Results   Two liver lesions were noted during diagnostic laparoscopy and biopsy revealed benign hepatic parenchyma with assoicated fibrosis with no evidence of malignancy.   10/06/2016 - 10/20/2016 Chemotherapy   Gemcitabine and Abraxane weekly, on day 1, 8, and 15 every 28 days, starting on 10/06/2016, held after1 cycle due to pending surgery at [facility]   10/31/2016 Imaging   CT CAP w contrast at [facility] 10/31/16 Impression: 1.Interval decrease in the size of the ill-defined pancreatic head mass. There is short segment abutment of the portal vein/superior mesenteric vein but no evidence of venous distortion. 2.No evidence of metastatic disease in the chest, abdomen and pelvis.   11/17/2016 -  Hospital Admission   Patient presents to hospital for nausea and vomitting   11/17/2016 Surgery   pancreatecomy, promximal subtotal with total duodenectomy, partial  GASTRECTOMY, CHOLEDOCHOENTEROSTOMY AND GASTROJEJUNOSTOMY (WHIPPLE-TYPE PROCEDURE); WITH PANCREATOJEJUNOSTOMY   01/19/2017 - 03/02/2017 Chemotherapy   Gemcitabine monotherapy: 1000mg /m2, d1,8 Q21d x2 cycles planned    05/18/2017 Imaging   CT CAP at [facility] 05/18/17 Impression: 1.Status post Whipple with soft  tissue density just posterior to the SMA, which may be postsurgical, lymph node, or less likely recurrent disease. Recommend attention on follow-up 2.No evidence of  metastatic disease in the chest, abdomen, and pelvis.     09/20/2017 Imaging   CT CAP at [facility] 09/20/17 Impression: 1. Unchanged subcentimeter soft tissue nodule in the Whipple surgical bed, likely a small lymph node. 2. No evidence of metastatic disease in the chest, abdomen, or pelvis.   01/17/2018 Imaging   01/17/2018 CT CAP Impression: No evidence of recurrent or metastatic disease.   06/13/2018 Imaging   CT CAP W Contrast at [place] [facility] on 06/13/18 Impression:  1. Status post Whipple procedure with stable findings at the surgical bed. No convincing evidence of recurrent or metastatic disease within the chest, abdomen, or pelvis.  Electronically Reviewed by:  Gaye Pollack, MD, [facility] Electronically Reviewed on:  06/13/2018 12:15 PM  I have reviewed the images and concur with the above findings.   11/08/2018 Imaging   CT CAP W Contrast  IMPRESSION: 1. No findings of active malignancy. Prior Whipple procedure. Biliary stent in place. 2. Other imaging findings of potential clinical significance: Thoracic scoliosis. Aortic Atherosclerosis (ICD10-I70.0). Multilevel lumbar impingement.     02/06/2019 Imaging   CT CAP W Contrast IMPRESSION: 1. Status post Whipple procedure with no definitive findings to suggest metastatic disease in the chest, abdomen or pelvis. 2. Stable tiny pulmonary nodules in the right upper lobe compared to prior examinations, strongly favored to be benign. 3. Aortic atherosclerosis, in addition to left main and 2 vessel coronary artery disease. Please note that although the presence of coronary artery calcium documents the presence of coronary artery disease, the severity of this disease and any potential stenosis cannot be assessed on this non-gated CT examination. Assessment for potential risk factor modification, dietary therapy or pharmacologic therapy may be warranted, if clinically indicated. 4. Additional incidental findings, as above.       CURRENT THERAPY:  Surveillance  INTERVAL HISTORY:  Colton Bush is here for a follow up of pancreatic cancer. He presents to the clinic alone.   He is doing well overall, seen by cardiology last month, and started Crestor  No abdominal pain or other concerns, appetei is good , BM normal, lost about 7 lbs due to eating less, but feels well with good energy level. He has been off work, still looking for job.  Review of system otherwise negative Due to the cost issue, he has gradually decreased his creon dose, he is on once daily, no diarrhea.      MEDICAL HISTORY:  Past Medical History:  Diagnosis Date  . Arthritis    left hand  . Bronchitis 1977  . Cancer ([place]) 03/09/2016   pancreatic cancer, sees Dr. Cristino Martes at [facility]   . Depression    takes Cymbalta daily  . Diabetes mellitus type II    sees Dr. Chalmers Cater   . GERD (gastroesophageal reflux disease)    takes Omeprazole daily  . H/O hiatal hernia   . Hyperlipidemia    takes Zocor daily  . Hypertension    takes Amlodipine daily  . Hypoglycemia 06/18/2017  . Neck pain    C4-7 stenosis and herniated disc  . Neuromuscular disorder ([place])    hiatal hernia  . Scoliosis    slight  . Spinal cord injury, C5-C7 ([place])    c4-c7  . Stiffness of hand joint    d/t cervical issues    SURGICAL HISTORY: Past Surgical History:  Procedure Laterality Date  . ANTERIOR CERVICAL DECOMP/DISCECTOMY FUSION  08/18/2011   Procedure: ANTERIOR CERVICAL DECOMPRESSION/DISCECTOMY FUSION 3 LEVELS;  Surgeon: Winfield Cunas, MD;  Location: [facility];  Service: Neurosurgery;  Laterality: N/A;  Anterior Cervical Four-Five/Five-Six/Six-Seven Decompression with Fusion, Plating, and Bonegraft  . CARPAL TUNNEL RELEASE  2013   bilateral, per Dr. Christella Noa   . COLONOSCOPY  10-30-14   per Dr. Olevia Perches, clear, repeat in 10 yrs   . egd with esophageal dilation  9-08   per Dr. Olevia Perches  . ERCP N/A 03/01/2016   Procedure: ENDOSCOPIC RETROGRADE  CHOLANGIOPANCREATOGRAPHY (ERCP) with brushings and stent;  Surgeon: Doran Stabler, MD;  Location: [facility];  Service: Endoscopy;  Laterality: N/A;  . EUS N/A 03/09/2016   Procedure: ESOPHAGEAL ENDOSCOPIC ULTRASOUND (EUS) RADIAL;  Surgeon: Milus Banister, MD;  Location: [facility];  Service: Endoscopy;  Laterality: N/A;  . lymph nodes biopsy    . melanoma rt calf  1999  . PORT-A-CATH REMOVAL N/A 04/03/2019   Procedure: PORT REMOVAL;  Surgeon: Stark Klein, MD;  Location: [place];  Service: General;  Laterality: N/A;  . PORTACATH PLACEMENT Left 03/22/2016   Procedure: INSERTION PORT-A-CATH;  Surgeon: Stark Klein, MD;  Location: [facility];  Service: General;  Laterality: Left;  . SPINE SURGERY    . TONSILLECTOMY     as a child  . ULNAR TUNNEL RELEASE  2013   right arm, per Dr. Christella Noa   . UPPER GASTROINTESTINAL ENDOSCOPY    . WHIPPLE PROCEDURE N/A 09/19/2016   Procedure: DIAGNOSTIC LAPAROSCOPY, LAPAROSCOPIC LIVER BIOPSY, RETROPERITONEAL EXPLORATION, INTRAOPERATIVE ULTRASOUND;  Surgeon: Stark Klein, MD;  Location: [facility];  Service: General;  Laterality: N/A;    I have reviewed the social history and family history with the patient and they are unchanged from previous note.  ALLERGIES:  has No Known Allergies.  MEDICATIONS:  Current Outpatient Medications  Medication Sig Dispense Refill  . amLODipine (NORVASC) 10 MG tablet TAKE 1 TABLET BY MOUTH EVERY DAY (Patient taking differently: Take 10 mg by mouth daily. ) 90 tablet 3  . benazepril (LOTENSIN) 10 MG tablet TAKE 1 TABLET BY MOUTH EVERY DAY (Patient taking differently: Take 10 mg by mouth daily. ) 90 tablet 0  . CREON 36000 units CPEP capsule Take 2 capsules by mouth 3 (three) times daily before meals.   3  . diphenhydrAMINE (BENADRYL) 25 MG tablet Take 25 mg by mouth at bedtime as needed for sleep.    . ferrous sulfate 325 (65 FE) MG tablet Take 325 mg daily with breakfast by mouth.    Marland Kitchen FREESTYLE LITE test strip USE ONE STRIP TO  CHECK GLUCOSE ONCE DAILY. PLEASE  SCHEDULE FOLLOW UP 50 each 0  . Insulin Detemir (LEVEMIR) 100 UNIT/ML Pen Inject 15 Units into the skin daily at 10 pm. (Patient taking differently: Inject 15 Units into the skin daily. ) 90 mL 3  . Insulin Pen Needle 30G X 5 MM MISC Use one daily with insulin 100 each 2  . KLOR-CON M20 20 MEQ tablet TAKE 1 TABLET BY MOUTH EVERY DAY 90 tablet 3  . Lancets (FREESTYLE) lancets USE AS INSTRUCTED TO CHECK BLOOD SUGAR ONCE A DAY 100 each 2  . lidocaine-prilocaine (EMLA) cream Apply to affected area once (Patient taking differently: Apply 1 application topically daily as needed (port access). ) 30 g 3  . loratadine (CLARITIN) 10 MG tablet Take 10 mg by mouth daily.    . Multiple Vitamin (MULTIVITAMIN WITH MINERALS)  TABS tablet Take 1 tablet by mouth daily.    . naproxen sodium (ANAPROX) 220 MG tablet Take 440 mg by mouth 2 (two) times daily as needed (for pain.).    Marland Kitchen NOVOLOG FLEXPEN 100 UNIT/ML FlexPen GIVE EVERY MORNING WITH BREAKFAST AND EVERY EVENING WITH SUPPER PER SLIDING SCALE (Patient taking differently: Inject 3-9 Units into the skin 3 (three) times daily with meals. ) 3 mL 0  . Omega-3 Fatty Acids (FISH OIL) 500 MG CAPS Take 500 mg by mouth daily.    Marland Kitchen omeprazole (PRILOSEC) 20 MG capsule TAKE 1 CAPSULE BY MOUTH EVERY DAY (Patient taking differently: Take 20 mg by mouth daily. ) 90 capsule 0  . oxyCODONE (OXY IR/ROXICODONE) 5 MG immediate release tablet Take 1 tablet (5 mg total) by mouth every 6 (six) hours as needed for severe pain. 10 tablet 0  . rosuvastatin (CRESTOR) 20 MG tablet Take 1 tablet (20 mg total) by mouth daily. 90 tablet 3   No current facility-administered medications for this visit.    Facility-Administered Medications Ordered in Other Visits  Medication Dose Route Frequency Provider Last Rate Last Dose  . sodium chloride 0.9 % injection 10 mL  10 mL Intravenous PRN Truitt Merle, MD   10 mL at 02/09/17 1111    PHYSICAL EXAMINATION: ECOG  PERFORMANCE STATUS: 0 - Asymptomatic  Vitals:   06/13/19 1005  BP: (!) 151/82  Pulse: 62  Resp: 17  Temp: 98.3 F (36.8 C)  SpO2: 99%   Filed Weights   06/13/19 1005  Weight: 205 lb 4.8 oz (93.1 kg)   GENERAL:alert, no distress and comfortable SKIN: skin color, texture, turgor are normal, no rashes or significant lesions EYES: normal, Conjunctiva are pink and non-injected, sclera clear NECK: supple, thyroid normal size, non-tender, without nodularity LYMPH:  no palpable lymphadenopathy in the cervical, axillary  LUNGS: clear to auscultation and percussion with normal breathing effort HEART: regular rate & rhythm and no murmurs and no lower extremity edema ABDOMEN:abdomen soft, non-tender and normal bowel sounds, large surgical scar in the midline, no organomegaly. Musculoskeletal:no cyanosis of digits and no clubbing  NEURO: alert & oriented x 3 with fluent speech, no focal motor/sensory deficits  LABORATORY DATA:  I have reviewed the data as listed CBC Latest Ref Rng & Units 06/13/2019 04/01/2019 02/06/2019  WBC 4.0 - 10.5 K/uL 7.2 7.2 11.3(H)  Hemoglobin 13.0 - 17.0 g/dL 14.8 14.9 15.0  Hematocrit 39.0 - 52.0 % 44.7 46.2 45.7  Platelets 150 - 400 K/uL 233 306 299     CMP Latest Ref Rng & Units 04/01/2019 02/06/2019 12/11/2018  Glucose 70 - 99 mg/dL 306(H) 147(H) 99  BUN 8 - 23 mg/dL 12 17 13   Creatinine 0.61 - 1.24 mg/dL 0.59(L) 0.82 0.80  Sodium 135 - 145 mmol/L 137 141 141  Potassium 3.5 - 5.1 mmol/L 3.8 4.3 3.6  Chloride 98 - 111 mmol/L 102 104 106  CO2 22 - 32 mmol/L 24 26 24   Calcium 8.9 - 10.3 mg/dL 9.1 9.7 9.2  Total Protein 6.5 - 8.1 g/dL - 8.2(H) 7.6  Total Bilirubin 0.3 - 1.2 mg/dL - 0.5 0.5  Alkaline Phos 38 - 126 U/L - 128(H) 135(H)  AST 15 - 41 U/L - 24 23  ALT 0 - 44 U/L - 32 32      RADIOGRAPHIC STUDIES: I have personally reviewed the radiological images as listed and agreed with the findings in the report. No results found.   ASSESSMENT & PLAN:   Colton Bush is a 63 y.o. male with    1. Primary pancreatic adenocarcinoma, in pancreatic head, cT2N1M0, stage IIB, ypT2N0M0 -He was diagnosed in 02/2016. He is s/p neoadjuvant FOLFIRINOX, concurrentchemoRT with Xeloda[facility]. -He is currently on surveillance and has been NED on latest scans in 01/2019 -He had PAC removed in 04/2019 -He is clinically doing well, exam was unremarkable, lab reviewed, CBC is within normal limits, CMP and CA 19.9 still pending -He has appointment at [facility] with in January 2021 -He is almost 3 years out of his surgery, the risk of recurrence is much less.  I do not plan to repeat surveillance CT scan after his next one at [facility]  -will see him back in July 2021   2. Type 2 DM, HTN -He will continue medication and follow up with his primary care physician Dr. Sarajane Jews    Plan -He is doing well, no concern for recurrence -He has appointment with Dr. Mariah Milling and CT scan at [facility] in January 2021 -Lab and follow-up in July 2021   No problem-specific Assessment & Plan notes found for this encounter.   No orders of the defined types were placed in this encounter.  All questions were answered. The patient knows to call the clinic with any problems, questions or concerns. No barriers to learning was detected. I spent 15 minutes counseling the patient face to face. The total time spent in the appointment was 20 minutes and more than 50% was on counseling and review of test results     Truitt Merle, MD 06/13/2019   I, Joslyn Devon, am acting as scribe for Truitt Merle, MD.   I have reviewed the above documentation for accuracy and completeness, and I agree with the above.

## 2019-06-13 ENCOUNTER — Other Ambulatory Visit: Payer: Self-pay

## 2019-06-13 ENCOUNTER — Encounter: Payer: Self-pay | Admitting: Hematology

## 2019-06-13 ENCOUNTER — Inpatient Hospital Stay (HOSPITAL_BASED_OUTPATIENT_CLINIC_OR_DEPARTMENT_OTHER): Payer: Medicare Other | Admitting: Hematology

## 2019-06-13 ENCOUNTER — Inpatient Hospital Stay: Payer: Medicare Other | Attending: Hematology

## 2019-06-13 ENCOUNTER — Telehealth: Payer: Self-pay | Admitting: Hematology

## 2019-06-13 VITALS — BP 151/82 | HR 62 | Temp 98.3°F | Resp 17 | Ht 71.0 in | Wt 205.3 lb

## 2019-06-13 DIAGNOSIS — Z923 Personal history of irradiation: Secondary | ICD-10-CM | POA: Diagnosis not present

## 2019-06-13 DIAGNOSIS — K219 Gastro-esophageal reflux disease without esophagitis: Secondary | ICD-10-CM | POA: Diagnosis not present

## 2019-06-13 DIAGNOSIS — I10 Essential (primary) hypertension: Secondary | ICD-10-CM | POA: Insufficient documentation

## 2019-06-13 DIAGNOSIS — E119 Type 2 diabetes mellitus without complications: Secondary | ICD-10-CM | POA: Insufficient documentation

## 2019-06-13 DIAGNOSIS — I2510 Atherosclerotic heart disease of native coronary artery without angina pectoris: Secondary | ICD-10-CM

## 2019-06-13 DIAGNOSIS — Z794 Long term (current) use of insulin: Secondary | ICD-10-CM | POA: Insufficient documentation

## 2019-06-13 DIAGNOSIS — M1990 Unspecified osteoarthritis, unspecified site: Secondary | ICD-10-CM | POA: Diagnosis not present

## 2019-06-13 DIAGNOSIS — C250 Malignant neoplasm of head of pancreas: Secondary | ICD-10-CM | POA: Insufficient documentation

## 2019-06-13 DIAGNOSIS — I2584 Coronary atherosclerosis due to calcified coronary lesion: Secondary | ICD-10-CM

## 2019-06-13 DIAGNOSIS — R918 Other nonspecific abnormal finding of lung field: Secondary | ICD-10-CM | POA: Diagnosis not present

## 2019-06-13 DIAGNOSIS — I700 Atherosclerosis of aorta: Secondary | ICD-10-CM | POA: Insufficient documentation

## 2019-06-13 DIAGNOSIS — Z9221 Personal history of antineoplastic chemotherapy: Secondary | ICD-10-CM | POA: Diagnosis not present

## 2019-06-13 DIAGNOSIS — Z79899 Other long term (current) drug therapy: Secondary | ICD-10-CM | POA: Diagnosis not present

## 2019-06-13 DIAGNOSIS — E785 Hyperlipidemia, unspecified: Secondary | ICD-10-CM | POA: Insufficient documentation

## 2019-06-13 LAB — COMPREHENSIVE METABOLIC PANEL
ALT: 43 U/L (ref 0–44)
AST: 39 U/L (ref 15–41)
Albumin: 4.1 g/dL (ref 3.5–5.0)
Alkaline Phosphatase: 111 U/L (ref 38–126)
Anion gap: 9 (ref 5–15)
BUN: 11 mg/dL (ref 8–23)
CO2: 28 mmol/L (ref 22–32)
Calcium: 9.3 mg/dL (ref 8.9–10.3)
Chloride: 104 mmol/L (ref 98–111)
Creatinine, Ser: 0.79 mg/dL (ref 0.61–1.24)
GFR calc Af Amer: >60 mL/min (ref >60)
GFR calc non Af Amer: >60 mL/min (ref >60)
Glucose, Bld: 131 mg/dL — ABNORMAL HIGH (ref 70–99)
Potassium: 4.1 mmol/L (ref 3.5–5.1)
Sodium: 141 mmol/L (ref 135–145)
Total Bilirubin: 0.8 mg/dL (ref 0.3–1.2)
Total Protein: 8 g/dL (ref 6.5–8.1)

## 2019-06-13 LAB — CBC WITH DIFFERENTIAL/PLATELET
Abs Immature Granulocytes: 0.01 10*3/uL (ref 0.00–0.07)
Basophils Absolute: 0.1 10*3/uL (ref 0.0–0.1)
Basophils Relative: 1 %
Eosinophils Absolute: 0.2 10*3/uL (ref 0.0–0.5)
Eosinophils Relative: 3 %
HCT: 44.7 % (ref 39.0–52.0)
Hemoglobin: 14.8 g/dL (ref 13.0–17.0)
Immature Granulocytes: 0 %
Lymphocytes Relative: 17 %
Lymphs Abs: 1.2 10*3/uL (ref 0.7–4.0)
MCH: 29.7 pg (ref 26.0–34.0)
MCHC: 33.1 g/dL (ref 30.0–36.0)
MCV: 89.6 fL (ref 80.0–100.0)
Monocytes Absolute: 0.6 10*3/uL (ref 0.1–1.0)
Monocytes Relative: 8 %
Neutro Abs: 5.1 10*3/uL (ref 1.7–7.7)
Neutrophils Relative %: 71 %
Platelets: 233 10*3/uL (ref 150–400)
RBC: 4.99 MIL/uL (ref 4.22–5.81)
RDW: 13.1 % (ref 11.5–15.5)
WBC: 7.2 10*3/uL (ref 4.0–10.5)
nRBC: 0 % (ref 0.0–0.2)

## 2019-06-13 NOTE — Telephone Encounter (Signed)
Scheduled per 11/13 los, patient will receive notifications on My chart.

## 2019-06-14 LAB — CANCER ANTIGEN 19-9: CA 19-9: 66 U/mL — ABNORMAL HIGH (ref 0–35)

## 2019-06-17 ENCOUNTER — Telehealth: Payer: Self-pay | Admitting: *Deleted

## 2019-06-17 NOTE — Telephone Encounter (Signed)
-----   Message from Truitt Merle, MD sent at 06/17/2019  8:56 AM EST ----- Please let pt know his tumor marker, slightly elevated. It was up and down in the past, so I think it's non-specific. He is scheduled to have CT scan at [facility] in Jan 2021, which I think is a good plan, no additional f/u is needed at this point, thanks   Truitt Merle  06/17/2019

## 2019-06-17 NOTE — Telephone Encounter (Signed)
Called pt & informed of slightly elevated tumor marker which Dr [name] thinks is non-specific but good that he has CT scheduled in Jan.  He reports that he will have copy of [facility] Scan brought to [place].

## 2019-08-14 DIAGNOSIS — K769 Liver disease, unspecified: Secondary | ICD-10-CM | POA: Diagnosis not present

## 2019-08-14 DIAGNOSIS — C250 Malignant neoplasm of head of pancreas: Secondary | ICD-10-CM | POA: Diagnosis not present

## 2019-08-14 DIAGNOSIS — Z90411 Acquired partial absence of pancreas: Secondary | ICD-10-CM | POA: Diagnosis not present

## 2019-08-21 ENCOUNTER — Other Ambulatory Visit: Payer: Self-pay | Admitting: Hematology

## 2019-08-21 ENCOUNTER — Telehealth: Payer: Self-pay | Admitting: Hematology

## 2019-08-21 DIAGNOSIS — C250 Malignant neoplasm of head of pancreas: Secondary | ICD-10-CM

## 2019-08-21 NOTE — Telephone Encounter (Signed)
Patient recently had labs, CT abdomen pelvis and a visit with his oncologist at [facility]. His tumor marker CA19.9 has increased significantly, CT scan showed stable soft tissue at the surgical bed, no other evidence of metastasis.  I recommend a PET scan for further evaluation, and a repeat lab in the next few weeks.  He agrees with the plan.  I will call him after the PET scan, to decide if he needs biopsy or not.  Truitt Merle  08/21/2019

## 2019-08-25 ENCOUNTER — Other Ambulatory Visit (HOSPITAL_COMMUNITY): Payer: Self-pay | Admitting: Hematology

## 2019-08-25 ENCOUNTER — Inpatient Hospital Stay
Admission: RE | Admit: 2019-08-25 | Discharge: 2019-08-25 | Disposition: A | Discharge status: Home / Self Care | Payer: Self-pay | Source: Ambulatory Visit | Attending: Hematology | Admitting: Hematology

## 2019-08-25 ENCOUNTER — Telehealth: Payer: Self-pay | Admitting: Hematology

## 2019-08-25 DIAGNOSIS — C801 Malignant (primary) neoplasm, unspecified: Secondary | ICD-10-CM

## 2019-08-25 NOTE — Telephone Encounter (Signed)
Scheduled per 1/21 sch msg. Called and spoke with pt, confirmed 2/4 appt

## 2019-09-01 NOTE — Progress Notes (Signed)
[place]   Telephone:(336) [phone] Fax:(336) [phone]   Clinic Follow up Note   Patient Care Team: [name], MD as PCP - General  Date of Service:  09/05/2019  CHIEF COMPLAINT: Follow up pancreatic cancer, review PET scan   SUMMARY OF ONCOLOGIC HISTORY: Oncology History Overview Note  Cancer Staging Adenocarcinoma of head of pancreas [facility]) Staging form: Pancreas, AJCC 7th Edition - Clinical stage from 03/09/2016: Stage IIB (T2, N1, M0) - Signed by Truitt Merle, MD on 03/17/2016 - Pathologic stage from 11/17/2016: Stage IB (yT2, N0, cM0) - Signed by Truitt Merle, MD on 12/10/2016     Adenocarcinoma of head of pancreas ([place])  02/27/2016 Imaging   CT chest, abdomen and pelvis with contrast showed ill-defined heterogeneity of pancreatic head, highly suspicious for malignancy, ) quit about and biliary ductal dilatation, mildly prominent lymph nodes in the upper abdomen, largest 2 cm in the portocaval . No other metastasis.    03/09/2016 Initial Diagnosis   Adenocarcinoma of head of pancreas ([place])   03/09/2016 Procedure   Upper EUS showed a 3.5 cm irregular mass in the pancreatic head, causing pancreatic and biliary duct obstruction. The mass involves the portal vein 422 mm, strongly suggesting invasion. There is suspicious nearby adenopathy   03/09/2016 Initial Biopsy   Fine-needle aspiration of the pancreatic mass from EUS showed malignant cells consistent with adenocarcinoma.    03/28/2016 - 05/24/2016 Chemotherapy   neoadjuvant chemo with FOLFIRINOX every 2 weeks, for 5 cycles    06/28/2016 - 08/10/2016 Radiation Therapy   Neoadjuvant radiation by Dr. [name] Site/dose:   Pancreas treated to 45 Gy in 25 fractions. The Pancreas was then boosted to 54 Gy in 5 fractions.   06/28/2016 - 08/10/2016 Chemotherapy   Xeloda 2000 mg in the morning and 1500 mg in the evening, on the day of radiation.   09/04/2016 Imaging   CT Chest Abdomen Pelvis IMPRESSION: 1. Poorly defined  pancreatic head mass is grossly stable, with associated pancreatic ductal dilatation and portacaval adenopathy. No associated vascular encasement. 2. Common bile duct stent in place with stable biliary ductal dilatation. Left hepatic lobe atrophy. 3. Hepatomegaly.  Spleen is at the upper limits normal in size. 4. Aortic atherosclerosis (ICD10-170.0). Coronary artery calcification.   09/19/2016 Surgery   Diagnostic laparoscopy and possible whipple procedure by Dr. Barry Dienes. Whipple procedure aborted due to the pancreatic cancer involving SMV, SMA and portal veins.   09/19/2016 Pathology Results   Two liver lesions were noted during diagnostic laparoscopy and biopsy revealed benign hepatic parenchyma with assoicated fibrosis with no evidence of malignancy.   10/06/2016 - 10/20/2016 Chemotherapy   Gemcitabine and Abraxane weekly, on day 1, 8, and 15 every 28 days, starting on 10/06/2016, held after1 cycle due to pending surgery at [facility]   10/31/2016 Imaging   CT CAP w contrast at [facility] 10/31/16 Impression: 1.Interval decrease in the size of the ill-defined pancreatic head mass. There is short segment abutment of the portal vein/superior mesenteric vein but no evidence of venous distortion. 2.No evidence of metastatic disease in the chest, abdomen and pelvis.   11/17/2016 -  Hospital Admission   Patient presents to hospital for nausea and vomitting   11/17/2016 Surgery   pancreatecomy, promximal subtotal with total duodenectomy, partial  GASTRECTOMY, CHOLEDOCHOENTEROSTOMY AND GASTROJEJUNOSTOMY (WHIPPLE-TYPE PROCEDURE); WITH PANCREATOJEJUNOSTOMY   01/19/2017 - 03/02/2017 Chemotherapy   Gemcitabine monotherapy: 1000mg /m2, d1,8 Q21d x2 cycles planned    05/18/2017 Imaging   CT CAP at [facility] 05/18/17 Impression: 1.Status  post Whipple with soft tissue density just posterior to the SMA, which may be postsurgical, lymph node, or less likely recurrent disease. Recommend attention on follow-up 2.No  evidence of metastatic disease in the chest, abdomen, and pelvis.     09/20/2017 Imaging   CT CAP at [facility] 09/20/17 Impression: 1. Unchanged subcentimeter soft tissue nodule in the Whipple surgical bed, likely a small lymph node. 2. No evidence of metastatic disease in the chest, abdomen, or pelvis.   01/17/2018 Imaging   01/17/2018 CT CAP Impression: No evidence of recurrent or metastatic disease.   06/13/2018 Imaging   CT CAP W Contrast at [place] [facility] on 06/13/18 Impression:  1. Status post Whipple procedure with stable findings at the surgical bed. No convincing evidence of recurrent or metastatic disease within the chest, abdomen, or pelvis.  Electronically Reviewed by:  Gaye Pollack, MD, [facility] Electronically Reviewed on:  06/13/2018 12:15 PM  I have reviewed the images and concur with the above findings.   11/08/2018 Imaging   CT CAP W Contrast  IMPRESSION: 1. No findings of active malignancy. Prior Whipple procedure. Biliary stent in place. 2. Other imaging findings of potential clinical significance: Thoracic scoliosis. Aortic Atherosclerosis (ICD10-I70.0). Multilevel lumbar impingement.     02/06/2019 Imaging   CT CAP W Contrast IMPRESSION: 1. Status post Whipple procedure with no definitive findings to suggest metastatic disease in the chest, abdomen or pelvis. 2. Stable tiny pulmonary nodules in the right upper lobe compared to prior examinations, strongly favored to be benign. 3. Aortic atherosclerosis, in addition to left main and 2 vessel coronary artery disease. Please note that although the presence of coronary artery calcium documents the presence of coronary artery disease, the severity of this disease and any potential stenosis cannot be assessed on this non-gated CT examination. Assessment for potential risk factor modification, dietary therapy or pharmacologic therapy may be warranted, if clinically indicated. 4. Additional incidental findings, as  above.   08/14/2019 Imaging   CT CAP at [facility] 08/14/19 IMPRESSION: 1.  History of pancreatic adenocarcinoma status post Whipple with stable findings at the surgical bed. 2.  No evidence of metastatic disease in the chest, abdomen, or pelvis. 3.  Arterial phase hyperenhancing 9 mm lesion in subcapsular portion of hepatic segment 8, favor a flash filling hemangioma. Attention on follow-up.   09/04/2019 PET scan   IMPRESSION: 1. Post Whipple procedure without evidence of local pancreatic carcinoma recurrence. 2. No evidence metastatic disease in the liver or periportal lymph nodes. 3. No distant metastatic disease.      CURRENT THERAPY:  Surveillance  INTERVAL HISTORY:  Colton Bush is here for a follow up of pancreatic cancer. He presents to the clinic with his wife. He notes he is doing well. He notes he is eating well and able to gain weight. He denies any pain or nausea. He notes with creon his BMs are controlled. He notes with copay he tried to stretch it out so he does not pay it monthly. He plans to f/u with [facility] on 4/13.    REVIEW OF SYSTEMS:   Constitutional: Denies fevers, chills or abnormal weight loss Eyes: Denies blurriness of vision Ears, nose, mouth, throat, and face: Denies mucositis or sore throat Respiratory: Denies cough, dyspnea or wheezes Cardiovascular: Denies palpitation, chest discomfort or lower extremity swelling Gastrointestinal:  Denies nausea, heartburn (+) BMs controlled on creon  Skin: Denies abnormal skin rashes Lymphatics: Denies new lymphadenopathy or easy bruising Neurological:Denies numbness, tingling or new weaknesses Behavioral/Psych: Mood is  stable, no new changes  All other systems were reviewed with the patient and are negative.  MEDICAL HISTORY:  Past Medical History:  Diagnosis Date  . Arthritis    left hand  . Bronchitis 1977  . Cancer ([place]) 03/09/2016   pancreatic cancer, sees Dr. Cristino Martes at [facility]   . Depression    takes  Cymbalta daily  . Diabetes mellitus type II    sees Dr. Chalmers Cater   . GERD (gastroesophageal reflux disease)    takes Omeprazole daily  . H/O hiatal hernia   . Hyperlipidemia    takes Zocor daily  . Hypertension    takes Amlodipine daily  . Hypoglycemia 06/18/2017  . Neck pain    C4-7 stenosis and herniated disc  . Neuromuscular disorder ([place])    hiatal hernia  . Scoliosis    slight  . Spinal cord injury, C5-C7 ([place])    c4-c7  . Stiffness of hand joint    d/t cervical issues    SURGICAL HISTORY: Past Surgical History:  Procedure Laterality Date  . ANTERIOR CERVICAL DECOMP/DISCECTOMY FUSION  08/18/2011   Procedure: ANTERIOR CERVICAL DECOMPRESSION/DISCECTOMY FUSION 3 LEVELS;  Surgeon: Winfield Cunas, MD;  Location: [place] [facility];  Service: Neurosurgery;  Laterality: N/A;  Anterior Cervical Four-Five/Five-Six/Six-Seven Decompression with Fusion, Plating, and Bonegraft  . CARPAL TUNNEL RELEASE  2013   bilateral, per Dr. Christella Noa   . COLONOSCOPY  10-30-14   per Dr. Olevia Perches, clear, repeat in 10 yrs   . egd with esophageal dilation  9-08   per Dr. Olevia Perches  . ERCP N/A 03/01/2016   Procedure: ENDOSCOPIC RETROGRADE CHOLANGIOPANCREATOGRAPHY (ERCP) with brushings and stent;  Surgeon: Doran Stabler, MD;  Location: [facility];  Service: Endoscopy;  Laterality: N/A;  . EUS N/A 03/09/2016   Procedure: ESOPHAGEAL ENDOSCOPIC ULTRASOUND (EUS) RADIAL;  Surgeon: Milus Banister, MD;  Location: [facility];  Service: Endoscopy;  Laterality: N/A;  . lymph nodes biopsy    . melanoma rt calf  1999  . PORT-A-CATH REMOVAL N/A 04/03/2019   Procedure: PORT REMOVAL;  Surgeon: Stark Klein, MD;  Location: [place];  Service: General;  Laterality: N/A;  . PORTACATH PLACEMENT Left 03/22/2016   Procedure: INSERTION PORT-A-CATH;  Surgeon: Stark Klein, MD;  Location: [facility];  Service: General;  Laterality: Left;  . SPINE SURGERY    . TONSILLECTOMY     as a child  . ULNAR TUNNEL RELEASE  2013   right arm, per Dr.  Christella Noa   . UPPER GASTROINTESTINAL ENDOSCOPY    . WHIPPLE PROCEDURE N/A 09/19/2016   Procedure: DIAGNOSTIC LAPAROSCOPY, LAPAROSCOPIC LIVER BIOPSY, RETROPERITONEAL EXPLORATION, INTRAOPERATIVE ULTRASOUND;  Surgeon: Stark Klein, MD;  Location: [place];  Service: General;  Laterality: N/A;    I have reviewed the social history and family history with the patient and they are unchanged from previous note.  ALLERGIES:  has No Known Allergies.  MEDICATIONS:  Current Outpatient Medications  Medication Sig Dispense Refill  . amLODipine (NORVASC) 10 MG tablet TAKE 1 TABLET BY MOUTH EVERY DAY (Patient taking differently: Take 10 mg by mouth daily. ) 90 tablet 3  . benazepril (LOTENSIN) 10 MG tablet TAKE 1 TABLET BY MOUTH EVERY DAY (Patient taking differently: Take 10 mg by mouth daily. ) 90 tablet 0  . CREON 36000 units CPEP capsule Take 2 capsules by mouth 3 (three) times daily before meals.   3  . diphenhydrAMINE (BENADRYL) 25 MG tablet Take 25 mg by mouth at bedtime as needed for  sleep.    . ferrous sulfate 325 (65 FE) MG tablet Take 325 mg daily with breakfast by mouth.    Marland Kitchen FREESTYLE LITE test strip USE ONE STRIP TO CHECK GLUCOSE ONCE DAILY. PLEASE  SCHEDULE FOLLOW UP 50 each 0  . Insulin Detemir (LEVEMIR) 100 UNIT/ML Pen Inject 15 Units into the skin daily at 10 pm. (Patient taking differently: Inject 15 Units into the skin daily. ) 90 mL 3  . Insulin Pen Needle 30G X 5 MM MISC Use one daily with insulin 100 each 2  . KLOR-CON M20 20 MEQ tablet TAKE 1 TABLET BY MOUTH EVERY DAY 90 tablet 3  . Lancets (FREESTYLE) lancets USE AS INSTRUCTED TO CHECK BLOOD SUGAR ONCE A DAY 100 each 2  . lidocaine-prilocaine (EMLA) cream Apply to affected area once (Patient taking differently: Apply 1 application topically daily as needed (port access). ) 30 g 3  . loratadine (CLARITIN) 10 MG tablet Take 10 mg by mouth daily.    . Multiple Vitamin (MULTIVITAMIN WITH MINERALS) TABS tablet Take 1 tablet by mouth daily.      . naproxen sodium (ANAPROX) 220 MG tablet Take 440 mg by mouth 2 (two) times daily as needed (for pain.).    Marland Kitchen NOVOLOG FLEXPEN 100 UNIT/ML FlexPen GIVE EVERY MORNING WITH BREAKFAST AND EVERY EVENING WITH SUPPER PER SLIDING SCALE (Patient taking differently: Inject 3-9 Units into the skin 3 (three) times daily with meals. ) 3 mL 0  . Omega-3 Fatty Acids (FISH OIL) 500 MG CAPS Take 500 mg by mouth daily.    Marland Kitchen omeprazole (PRILOSEC) 20 MG capsule TAKE 1 CAPSULE BY MOUTH EVERY DAY (Patient taking differently: Take 20 mg by mouth daily. ) 90 capsule 0  . oxyCODONE (OXY IR/ROXICODONE) 5 MG immediate release tablet Take 1 tablet (5 mg total) by mouth every 6 (six) hours as needed for severe pain. 10 tablet 0  . rosuvastatin (CRESTOR) 20 MG tablet Take 1 tablet (20 mg total) by mouth daily. 90 tablet 3   No current facility-administered medications for this visit.   Facility-Administered Medications Ordered in Other Visits  Medication Dose Route Frequency Provider Last Rate Last Admin  . fludeoxyglucose F - 18 (FDG) injection [id number] millicurie  [id number] millicurie Intravenous Once PRN Macy Mis, MD      . sodium chloride 0.9 % injection 10 mL  10 mL Intravenous PRN Truitt Merle, MD   10 mL at 02/09/17 1111    PHYSICAL EXAMINATION: ECOG PERFORMANCE STATUS: 0 - Asymptomatic  Vitals:   09/05/19 1533  BP: (!) 149/79  Pulse: 73  Resp: 18  Temp: 98 F (36.7 C)  SpO2: 96%   Filed Weights   09/05/19 1533  Weight: 212 lb 1.6 oz (96.2 kg)    GENERAL:alert, no distress and comfortable SKIN: skin color, texture, turgor are normal, no rashes or significant lesions EYES: normal, Conjunctiva are pink and non-injected, sclera clear  NECK: supple, thyroid normal size, non-tender, without nodularity LYMPH:  no palpable lymphadenopathy in the cervical, axillary  LUNGS: clear to auscultation and percussion with normal breathing effort HEART: regular rate & rhythm and no murmurs and no lower extremity  edema ABDOMEN:abdomen soft, non-tender and normal bowel sounds Musculoskeletal:no cyanosis of digits and no clubbing  NEURO: alert & oriented x 3 with fluent speech, no focal motor/sensory deficits  LABORATORY DATA:  I have reviewed the data as listed CBC Latest Ref Rng & Units 09/04/2019 06/13/2019 04/01/2019  WBC 4.0 - 10.5 K/uL 8.8 7.2 7.2  Hemoglobin 13.0 - 17.0 g/dL 15.3 14.8 14.9  Hematocrit 39.0 - 52.0 % 45.1 44.7 46.2  Platelets 150 - 400 K/uL 244 233 306     CMP Latest Ref Rng & Units 09/04/2019 06/13/2019 04/01/2019  Glucose 70 - 99 mg/dL 174(H) 131(H) 306(H)  BUN 8 - 23 mg/dL 16 11 12   Creatinine 0.61 - 1.24 mg/dL 0.71 0.79 0.59(L)  Sodium 135 - 145 mmol/L 139 141 137  Potassium 3.5 - 5.1 mmol/L 4.3 4.1 3.8  Chloride 98 - 111 mmol/L 102 104 102  CO2 22 - 32 mmol/L 29 28 24   Calcium 8.9 - 10.3 mg/dL 9.5 9.3 9.1  Total Protein 6.5 - 8.1 g/dL 8.0 8.0 -  Total Bilirubin 0.3 - 1.2 mg/dL 0.8 0.8 -  Alkaline Phos 38 - 126 U/L 116 111 -  AST 15 - 41 U/L 29 39 -  ALT 0 - 44 U/L 45(H) 43 -      RADIOGRAPHIC STUDIES: I have personally reviewed the radiological images as listed and agreed with the findings in the report. NM PET Image Initial (PI) Skull Base To Thigh  Result Date: 09/04/2019 CLINICAL DATA:  Subsequent treatment strategy for pancreatic cancer. Patient status post Whipple procedure 2018. Post chemo radiation therapy completed 2018. EXAM: NUCLEAR MEDICINE PET SKULL BASE TO THIGH TECHNIQUE: 10.2 mCi F-18 FDG was injected intravenously. Full-ring PET imaging was performed from the skull base to thigh after the radiotracer. CT data was obtained and used for attenuation correction and anatomic localization. Fasting blood glucose: 160 mg/dl COMPARISON:  CT 08/14/2019 outside hospital, CT 02/06/2019 FINDINGS: Mediastinal blood pool activity: SUV max 2.6 Liver activity: SUV max 3.7 NECK: No hypermetabolic lymph nodes in the neck. Incidental CT findings: none CHEST: No hypermetabolic  mediastinal lymph nodes. Small focus of cutaneous metabolic activity in the upper medial LEFT chest is favored cutaneous infection. (Image 61). Incidental CT findings: No suspicious nodules ABDOMEN/PELVIS: Postsurgical change consistent with Whipple procedure. Soft tissue thickening at the surgical site adjacent to multiple surgical clips does not have significant metabolic activity. No abnormal activity within the liver. No hypermetabolic periportal or peripancreatic lymph nodes. No retroperitoneal or pelvic lymphadenopathy. Incidental CT findings: Prostate normal SKELETON: No focal hypermetabolic activity to suggest skeletal metastasis. Incidental CT findings: none IMPRESSION: 1. Post Whipple procedure without evidence of local pancreatic carcinoma recurrence. 2. No evidence metastatic disease in the liver or periportal lymph nodes. 3. No distant metastatic disease. Electronically Signed   By: Suzy Bouchard M.D.   On: 09/04/2019 09:55     ASSESSMENT & PLAN:  Colton Bush is a 64 y.o. male with    1. Primary pancreatic adenocarcinoma, in pancreatic head, cT2N1M0, stage IIB, ypT2N0M0 -He was diagnosed in 02/2016. He is s/p neoadjuvant FOLFIRINOX, concurrentchemoRT with Xeloda[facility]. -He is currently on surveillance. He had PAC removed in 04/2019 -during his f/u at [facility] on 08/2019,  his tumor marker CA19.9 has increased significantly, CT scan showed stable soft tissue at the surgical bed, no other evidence of metastasis. Due to the concern for cancer recurrence, I ordered PET. -I personally reviewed and discussed his PET from 09/04/19 which shows post whipple procedure without evidence of local recurrence or distant metastasis.  -His elevated CA 19.9 has decreased from 211 to 141 today (09/05/19). His CBC and CMP today is WNL except BG 174.  -He is 3.5 years from diagnosis and his risk of recurrence has decreased. I discussed continuing surveillance. Will  repeat another more  scan in 2021   -f/u in 01/2020, he has f/u at [facility] in April    2. Type 2 DM, HTN -He will continue medication and follow up with his primary care physician Dr. Sarajane Jews    3. Chronic Diarrhea  -Secondary to whipple surgery  -Well controlled/normal on Creon, will continue.    Plan -lab and PET scan reviewed, NED  -Lab and F/u in 01/2020   No problem-specific Assessment & Plan notes found for this encounter.   No orders of the defined types were placed in this encounter.  All questions were answered. The patient knows to call the clinic with any problems, questions or concerns. No barriers to learning was detected. The total time spent in the appointment was 30 minutes.     Truitt Merle, MD 09/05/2019   I, Joslyn Devon, am acting as scribe for Truitt Merle, MD.   I have reviewed the above documentation for accuracy and completeness, and I agree with the above.

## 2019-09-04 ENCOUNTER — Encounter (HOSPITAL_COMMUNITY)
Admission: RE | Admit: 2019-09-04 | Discharge: 2019-09-04 | Disposition: A | Discharge status: Home / Self Care | Payer: Medicare Other | Source: Ambulatory Visit | Attending: Hematology | Admitting: Hematology

## 2019-09-04 ENCOUNTER — Inpatient Hospital Stay: Payer: Medicare Other | Attending: Hematology

## 2019-09-04 ENCOUNTER — Other Ambulatory Visit: Payer: Self-pay

## 2019-09-04 DIAGNOSIS — I2510 Atherosclerotic heart disease of native coronary artery without angina pectoris: Secondary | ICD-10-CM | POA: Diagnosis not present

## 2019-09-04 DIAGNOSIS — Z794 Long term (current) use of insulin: Secondary | ICD-10-CM | POA: Insufficient documentation

## 2019-09-04 DIAGNOSIS — I10 Essential (primary) hypertension: Secondary | ICD-10-CM | POA: Insufficient documentation

## 2019-09-04 DIAGNOSIS — C250 Malignant neoplasm of head of pancreas: Secondary | ICD-10-CM | POA: Insufficient documentation

## 2019-09-04 DIAGNOSIS — M1990 Unspecified osteoarthritis, unspecified site: Secondary | ICD-10-CM | POA: Diagnosis not present

## 2019-09-04 DIAGNOSIS — K529 Noninfective gastroenteritis and colitis, unspecified: Secondary | ICD-10-CM | POA: Insufficient documentation

## 2019-09-04 DIAGNOSIS — Z9221 Personal history of antineoplastic chemotherapy: Secondary | ICD-10-CM | POA: Diagnosis not present

## 2019-09-04 DIAGNOSIS — E119 Type 2 diabetes mellitus without complications: Secondary | ICD-10-CM | POA: Diagnosis not present

## 2019-09-04 DIAGNOSIS — Z79899 Other long term (current) drug therapy: Secondary | ICD-10-CM | POA: Diagnosis not present

## 2019-09-04 DIAGNOSIS — C259 Malignant neoplasm of pancreas, unspecified: Secondary | ICD-10-CM | POA: Diagnosis not present

## 2019-09-04 DIAGNOSIS — Z923 Personal history of irradiation: Secondary | ICD-10-CM | POA: Diagnosis not present

## 2019-09-04 DIAGNOSIS — E785 Hyperlipidemia, unspecified: Secondary | ICD-10-CM | POA: Diagnosis not present

## 2019-09-04 LAB — CMP (CANCER CENTER ONLY)
ALT: 45 U/L — ABNORMAL HIGH (ref 0–44)
AST: 29 U/L (ref 15–41)
Albumin: 4.3 g/dL (ref 3.5–5.0)
Alkaline Phosphatase: 116 U/L (ref 38–126)
Anion gap: 8 (ref 5–15)
BUN: 16 mg/dL (ref 8–23)
CO2: 29 mmol/L (ref 22–32)
Calcium: 9.5 mg/dL (ref 8.9–10.3)
Chloride: 102 mmol/L (ref 98–111)
Creatinine: 0.71 mg/dL (ref 0.61–1.24)
GFR, Est AFR Am: >60 mL/min (ref >60)
GFR, Estimated: >60 mL/min (ref >60)
Glucose, Bld: 174 mg/dL — ABNORMAL HIGH (ref 70–99)
Potassium: 4.3 mmol/L (ref 3.5–5.1)
Sodium: 139 mmol/L (ref 135–145)
Total Bilirubin: 0.8 mg/dL (ref 0.3–1.2)
Total Protein: 8 g/dL (ref 6.5–8.1)

## 2019-09-04 LAB — CBC WITH DIFFERENTIAL/PLATELET
Abs Immature Granulocytes: 0.02 10*3/uL (ref 0.00–0.07)
Basophils Absolute: 0.1 10*3/uL (ref 0.0–0.1)
Basophils Relative: 1 %
Eosinophils Absolute: 0.3 10*3/uL (ref 0.0–0.5)
Eosinophils Relative: 4 %
HCT: 45.1 % (ref 39.0–52.0)
Hemoglobin: 15.3 g/dL (ref 13.0–17.0)
Immature Granulocytes: 0 %
Lymphocytes Relative: 21 %
Lymphs Abs: 1.8 10*3/uL (ref 0.7–4.0)
MCH: 30.7 pg (ref 26.0–34.0)
MCHC: 33.9 g/dL (ref 30.0–36.0)
MCV: 90.4 fL (ref 80.0–100.0)
Monocytes Absolute: 0.7 10*3/uL (ref 0.1–1.0)
Monocytes Relative: 8 %
Neutro Abs: 5.8 10*3/uL (ref 1.7–7.7)
Neutrophils Relative %: 66 %
Platelets: 244 10*3/uL (ref 150–400)
RBC: 4.99 MIL/uL (ref 4.22–5.81)
RDW: 12.9 % (ref 11.5–15.5)
WBC: 8.8 10*3/uL (ref 4.0–10.5)
nRBC: 0 % (ref 0.0–0.2)

## 2019-09-04 LAB — GLUCOSE, CAPILLARY: Glucose-Capillary: 165 mg/dL — ABNORMAL HIGH (ref 70–99)

## 2019-09-04 MED ORDER — FLUDEOXYGLUCOSE F - 18 (FDG) INJECTION: 10.2000 | Freq: Once | INTRAVENOUS | Status: DC | PRN

## 2019-09-05 ENCOUNTER — Other Ambulatory Visit: Payer: Self-pay

## 2019-09-05 ENCOUNTER — Inpatient Hospital Stay (HOSPITAL_BASED_OUTPATIENT_CLINIC_OR_DEPARTMENT_OTHER): Payer: Medicare Other | Admitting: Hematology

## 2019-09-05 ENCOUNTER — Ambulatory Visit: Payer: Medicare Other | Admitting: Hematology

## 2019-09-05 ENCOUNTER — Telehealth: Payer: Self-pay | Admitting: Hematology

## 2019-09-05 VITALS — BP 149/79 | HR 73 | Temp 98.0°F | Resp 18 | Ht 71.0 in | Wt 212.1 lb

## 2019-09-05 DIAGNOSIS — I2510 Atherosclerotic heart disease of native coronary artery without angina pectoris: Secondary | ICD-10-CM | POA: Diagnosis not present

## 2019-09-05 DIAGNOSIS — I10 Essential (primary) hypertension: Secondary | ICD-10-CM | POA: Diagnosis not present

## 2019-09-05 DIAGNOSIS — Z923 Personal history of irradiation: Secondary | ICD-10-CM | POA: Diagnosis not present

## 2019-09-05 DIAGNOSIS — Z9221 Personal history of antineoplastic chemotherapy: Secondary | ICD-10-CM | POA: Diagnosis not present

## 2019-09-05 DIAGNOSIS — C250 Malignant neoplasm of head of pancreas: Secondary | ICD-10-CM | POA: Diagnosis not present

## 2019-09-05 DIAGNOSIS — E785 Hyperlipidemia, unspecified: Secondary | ICD-10-CM | POA: Diagnosis not present

## 2019-09-05 LAB — CANCER ANTIGEN 19-9: CA 19-9: 141 U/mL — ABNORMAL HIGH (ref 0–35)

## 2019-09-05 NOTE — Telephone Encounter (Signed)
No los per 2/5.

## 2019-09-06 ENCOUNTER — Encounter: Payer: Self-pay | Admitting: Hematology

## 2019-09-08 ENCOUNTER — Ambulatory Visit: Payer: Medicare Other | Attending: Internal Medicine

## 2019-09-08 ENCOUNTER — Ambulatory Visit: Payer: Self-pay

## 2019-09-08 ENCOUNTER — Telehealth: Payer: Self-pay

## 2019-09-08 DIAGNOSIS — Z23 Encounter for immunization: Secondary | ICD-10-CM

## 2019-09-08 NOTE — Telephone Encounter (Signed)
I spoke with Tedra Coupe in radiology requesting that PET scan from 09/04/2019 be power shared with [facility].

## 2019-09-08 NOTE — Progress Notes (Signed)
   Covid-19 Vaccination Clinic  Name:  Colton Bush    MRN: [id number] DOB: 1955/09/23  09/08/2019  Colton Bush was observed post Covid-19 immunization for 15 minutes without incidence. He was provided with Vaccine Information Sheet and instruction to access the V-Safe system.   Colton Bush was instructed to call 911 with any severe reactions post vaccine: Marland Kitchen Difficulty breathing  . Swelling of your face and throat  . A fast heartbeat  . A bad rash all over your body  . Dizziness and weakness    Immunizations Administered    Name Date Dose VIS Date Route   Pfizer COVID-19 Vaccine 09/08/2019  5:22 PM 0.3 mL 07/11/2019 Intramuscular   Manufacturer: [place]   Lot: [id number]   [place]: [id number]

## 2019-10-03 ENCOUNTER — Ambulatory Visit: Payer: Medicare Other | Attending: Internal Medicine

## 2019-10-03 DIAGNOSIS — Z23 Encounter for immunization: Secondary | ICD-10-CM

## 2019-10-03 NOTE — Progress Notes (Signed)
   Covid-19 Vaccination Clinic  Name:  Colton Bush    MRN: [id number] DOB: 1955/08/17  10/03/2019  [name] was observed post Covid-19 immunization for 15 minutes without incident. He was provided with Vaccine Information Sheet and instruction to access the V-Safe system.   [name] was instructed to call 911 with any severe reactions post vaccine: Marland Kitchen Difficulty breathing  . Swelling of face and throat  . A fast heartbeat  . A bad rash all over body  . Dizziness and weakness   Immunizations Administered    Name Date Dose VIS Date Route   Pfizer COVID-19 Vaccine 10/03/2019  3:35 PM 0.3 mL 07/11/2019 Intramuscular   Manufacturer: [name]   Lot: [id number]   [place]: [id number]

## 2019-11-11 DIAGNOSIS — R899 Unspecified abnormal finding in specimens from other organs, systems and tissues: Secondary | ICD-10-CM | POA: Diagnosis not present

## 2019-11-11 DIAGNOSIS — C250 Malignant neoplasm of head of pancreas: Secondary | ICD-10-CM | POA: Diagnosis not present

## 2019-11-20 ENCOUNTER — Inpatient Hospital Stay
Admission: RE | Admit: 2019-11-20 | Discharge: 2019-11-20 | Disposition: A | Discharge status: Home / Self Care | Payer: Self-pay | Source: Ambulatory Visit | Attending: Hematology | Admitting: Hematology

## 2019-11-20 ENCOUNTER — Other Ambulatory Visit (HOSPITAL_COMMUNITY): Payer: Self-pay | Admitting: Hematology

## 2019-11-20 DIAGNOSIS — C801 Malignant (primary) neoplasm, unspecified: Secondary | ICD-10-CM

## 2019-11-21 ENCOUNTER — Telehealth: Payer: Self-pay

## 2019-11-21 NOTE — Telephone Encounter (Signed)
Mr Woulfe called stating he had labs drawn at [facility] this month and his tumor marker was elevated.  He wants to know why.  Do you want to tlk to him or should I refer him back to [facility]

## 2019-11-24 NOTE — Telephone Encounter (Signed)
I have reviewed his recent CT, lab results at [facility] and Dr. Alwyn Pea office note. I called pt back. He is clinically doing well, asymptomatic. I agree with Dr. Mariah Milling to repeat CT in 3 month. I did PET scan 2 months ago which was also negative. I reviewed symptoms from cancer recurrence, such as abdominal pain, bloating, weight loss, jaundice, etc, he knows to call me or Dr. Mariah Milling if needed. He agrees with the plan, I will postpone his visit with me to 6 months from now (he sees Dr. Mariah Milling back in 3 months with lab and scan).   Truitt Merle MD

## 2019-11-25 ENCOUNTER — Telehealth: Payer: Self-pay | Admitting: Hematology

## 2019-11-25 NOTE — Telephone Encounter (Signed)
R/s appt per 4/26 sch message - unable to reach pt . Left message with new appt date and time

## 2019-11-30 ENCOUNTER — Other Ambulatory Visit: Payer: Self-pay | Admitting: Family Medicine

## 2019-12-24 ENCOUNTER — Other Ambulatory Visit: Payer: Self-pay | Admitting: Family Medicine

## 2019-12-30 DIAGNOSIS — E113293 Type 2 diabetes mellitus with mild nonproliferative diabetic retinopathy without macular edema, bilateral: Secondary | ICD-10-CM | POA: Diagnosis not present

## 2020-01-02 DIAGNOSIS — E7800 Pure hypercholesterolemia, unspecified: Secondary | ICD-10-CM | POA: Diagnosis not present

## 2020-01-02 DIAGNOSIS — E109 Type 1 diabetes mellitus without complications: Secondary | ICD-10-CM | POA: Diagnosis not present

## 2020-01-12 ENCOUNTER — Other Ambulatory Visit: Payer: Self-pay

## 2020-01-12 DIAGNOSIS — E109 Type 1 diabetes mellitus without complications: Secondary | ICD-10-CM | POA: Diagnosis not present

## 2020-01-12 DIAGNOSIS — C259 Malignant neoplasm of pancreas, unspecified: Secondary | ICD-10-CM | POA: Diagnosis not present

## 2020-01-12 DIAGNOSIS — I10 Essential (primary) hypertension: Secondary | ICD-10-CM | POA: Diagnosis not present

## 2020-01-12 DIAGNOSIS — E7800 Pure hypercholesterolemia, unspecified: Secondary | ICD-10-CM | POA: Diagnosis not present

## 2020-01-13 ENCOUNTER — Encounter: Payer: Self-pay | Admitting: Family Medicine

## 2020-01-13 ENCOUNTER — Ambulatory Visit (INDEPENDENT_AMBULATORY_CARE_PROVIDER_SITE_OTHER): Payer: Medicare Other | Admitting: Family Medicine

## 2020-01-13 VITALS — BP 120/58 | HR 71 | Temp 98.1°F | Wt 202.0 lb

## 2020-01-13 DIAGNOSIS — L600 Ingrowing nail: Secondary | ICD-10-CM

## 2020-01-13 DIAGNOSIS — E1110 Type 2 diabetes mellitus with ketoacidosis without coma: Secondary | ICD-10-CM | POA: Diagnosis not present

## 2020-01-13 DIAGNOSIS — I10 Essential (primary) hypertension: Secondary | ICD-10-CM | POA: Diagnosis not present

## 2020-01-13 DIAGNOSIS — C250 Malignant neoplasm of head of pancreas: Secondary | ICD-10-CM | POA: Diagnosis not present

## 2020-01-13 MED ORDER — AMLODIPINE BESYLATE 10 MG PO TABS: 10.0000 mg | ORAL_TABLET | Freq: Every day | ORAL | 3 refills | Status: DC

## 2020-01-13 MED ORDER — BENAZEPRIL HCL 10 MG PO TABS: 10.0000 mg | ORAL_TABLET | Freq: Every day | ORAL | 0 refills | Status: DC

## 2020-01-13 NOTE — Progress Notes (Signed)
   Subjective:    Patient ID: Colton Bush, male    DOB: 1955/08/05, 64 y.o.   MRN: [id number]  HPI Here to follow up on HTN and to ask for a referral to Podiatry. He has been doing well, his pancreatic cancer seems to be stable, and his BP is well controlled. He sees Dr. Chalmers Cater for diabetes. He has an ingrown toenail on the right great toe which is painful.                                                                                                                                                        Review of Systems  Constitutional: Negative.   Respiratory: Negative.   Cardiovascular: Negative.   Neurological: Negative.        Objective:   Physical Exam Constitutional:      Appearance: Normal appearance. He is not ill-appearing.  Cardiovascular:     Rate and Rhythm: Normal rate and regular rhythm.     Pulses: Normal pulses.     Heart sounds: Normal heart sounds.  Pulmonary:     Effort: Pulmonary effort is normal.     Breath sounds: Normal breath sounds.  Musculoskeletal:     Right lower leg: No edema.     Left lower leg: No edema.  Neurological:     General: No focal deficit present.     Mental Status: He is alert and oriented to person, place, and time.           Assessment & Plan:  HTN is stable, refilled Amlodipine and Benazepril. Refer to Podiatry for the ingrown toenail.  Alysia Penna, MD

## 2020-01-14 ENCOUNTER — Encounter: Payer: Self-pay | Admitting: Podiatry

## 2020-01-14 ENCOUNTER — Other Ambulatory Visit: Payer: Self-pay

## 2020-01-14 ENCOUNTER — Ambulatory Visit (INDEPENDENT_AMBULATORY_CARE_PROVIDER_SITE_OTHER): Payer: Medicare Other | Admitting: Podiatry

## 2020-01-14 DIAGNOSIS — L600 Ingrowing nail: Secondary | ICD-10-CM

## 2020-01-14 DIAGNOSIS — B351 Tinea unguium: Secondary | ICD-10-CM

## 2020-01-14 MED ORDER — NEOMYCIN-POLYMYXIN-HC 3.5-10000-1 OT SOLN: OTIC | 1 refills | Status: DC

## 2020-01-14 NOTE — Patient Instructions (Signed)

## 2020-01-14 NOTE — Progress Notes (Signed)
Subjective:   Patient ID: Colton Bush, male   DOB: 64 y.o.   MRN: [id number]   HPI Patient states he has a severely thickened deformed right hallux nail that is been a problem for a fairly long period of time has had history of pancreatic cancer with procedure which seems to have been successful and is somewhat concerned about remaining nails.  Patient does not smoke and is reasonably active   Review of Systems  All other systems reviewed and are negative.       Objective:  Physical Exam Vitals and nursing note reviewed.  Constitutional:      Appearance: He is well-developed.  Pulmonary:     Effort: Pulmonary effort is normal.  Musculoskeletal:        General: Normal range of motion.  Skin:    General: Skin is warm.  Neurological:     Mental Status: He is alert.     Neurovascular status intact muscle strength was found to be adequate range of motion within normal limits.  Patient is noted to have a severely thickened dystrophic hallux nail right that is painful when pressed and also has some fungal infiltration of remaining nails low-grade with history of removal of the left hallux nail secondary to the same problem.  Patient has good digit perfusion well oriented x3 with sugar that is under good control currently secondary to previous Whipple procedure       Assessment:  Damaged right hallux nail with thickness dystrophic changes with moderate discomfort along with mycotic infection diabetes that does not appear to be creating significant problems currently     Plan:  H&P educated him on diabetic foot care and daily inspections and discussed removal of the hallux nail.  Patient wants this done understands risk signed consent form after review.  Today I infiltrated the right hallux 60 mg like Marcaine mixture sterile prep done and using sterile instrumentation remove the right hallux nail with exposed matrix applied phenol 5 applications 30 seconds followed by alcohol lavage  sterile dressing.  Gave instructions on soaks and to leave dressing on 24 hours take it off early if any throbbing were to occur and prescription for drops written.  Encouraged to call with questions concerns during the postoperative.

## 2020-02-10 DIAGNOSIS — Z90410 Acquired total absence of pancreas: Secondary | ICD-10-CM | POA: Diagnosis not present

## 2020-02-10 DIAGNOSIS — Z923 Personal history of irradiation: Secondary | ICD-10-CM | POA: Diagnosis not present

## 2020-02-10 DIAGNOSIS — C250 Malignant neoplasm of head of pancreas: Secondary | ICD-10-CM | POA: Diagnosis not present

## 2020-02-10 DIAGNOSIS — Z9221 Personal history of antineoplastic chemotherapy: Secondary | ICD-10-CM | POA: Diagnosis not present

## 2020-02-10 DIAGNOSIS — K7689 Other specified diseases of liver: Secondary | ICD-10-CM | POA: Diagnosis not present

## 2020-02-10 DIAGNOSIS — C259 Malignant neoplasm of pancreas, unspecified: Secondary | ICD-10-CM | POA: Diagnosis not present

## 2020-02-11 ENCOUNTER — Other Ambulatory Visit: Payer: Medicare Other

## 2020-02-11 ENCOUNTER — Ambulatory Visit: Payer: Medicare Other | Admitting: Hematology

## 2020-02-24 ENCOUNTER — Other Ambulatory Visit (HOSPITAL_COMMUNITY): Payer: Self-pay | Admitting: Hematology

## 2020-02-24 ENCOUNTER — Inpatient Hospital Stay
Admission: RE | Admit: 2020-02-24 | Discharge: 2020-02-24 | Disposition: A | Discharge status: Home / Self Care | Payer: Self-pay | Source: Ambulatory Visit | Attending: Hematology | Admitting: Hematology

## 2020-02-24 DIAGNOSIS — C801 Malignant (primary) neoplasm, unspecified: Secondary | ICD-10-CM

## 2020-03-03 ENCOUNTER — Other Ambulatory Visit: Payer: Self-pay | Admitting: Cardiovascular Disease

## 2020-04-08 ENCOUNTER — Other Ambulatory Visit: Payer: Self-pay | Admitting: Cardiovascular Disease

## 2020-04-12 NOTE — Progress Notes (Signed)
[place]   Telephone:(336) [phone] Fax:(336) [phone]   Clinic Follow up Note   Patient Care Team: [name], MD as PCP - General  Date of Service:  04/14/2020  CHIEF COMPLAINT: Follow up pancreatic cancer  SUMMARY OF ONCOLOGIC HISTORY: Oncology History Overview Note  Cancer Staging Adenocarcinoma of head of pancreas [facility]) Staging form: Pancreas, AJCC 7th Edition - Clinical stage from 03/09/2016: Stage IIB (T2, N1, M0) - Signed by Truitt Merle, MD on 03/17/2016 - Pathologic stage from 11/17/2016: Stage IB (yT2, N0, cM0) - Signed by Truitt Merle, MD on 12/10/2016     Adenocarcinoma of head of pancreas ([place])  02/27/2016 Imaging   CT chest, abdomen and pelvis with contrast showed ill-defined heterogeneity of pancreatic head, highly suspicious for malignancy, ) quit about and biliary ductal dilatation, mildly prominent lymph nodes in the upper abdomen, largest 2 cm in the portocaval . No other metastasis.    03/09/2016 Initial Diagnosis   Adenocarcinoma of head of pancreas ([place])   03/09/2016 Procedure   Upper EUS showed a 3.5 cm irregular mass in the pancreatic head, causing pancreatic and biliary duct obstruction. The mass involves the portal vein 422 mm, strongly suggesting invasion. There is suspicious nearby adenopathy   03/09/2016 Initial Biopsy   Fine-needle aspiration of the pancreatic mass from EUS showed malignant cells consistent with adenocarcinoma.    03/28/2016 - 05/24/2016 Chemotherapy   neoadjuvant chemo with FOLFIRINOX every 2 weeks, for 5 cycles    06/28/2016 - 08/10/2016 Radiation Therapy   Neoadjuvant radiation by Dr. [name] Site/dose:   Pancreas treated to 45 Gy in 25 fractions. The Pancreas was then boosted to 54 Gy in 5 fractions.   06/28/2016 - 08/10/2016 Chemotherapy   Xeloda 2000 mg in the morning and 1500 mg in the evening, on the day of radiation.   09/04/2016 Imaging   CT Chest Abdomen Pelvis IMPRESSION: 1. Poorly defined pancreatic head  mass is grossly stable, with associated pancreatic ductal dilatation and portacaval adenopathy. No associated vascular encasement. 2. Common bile duct stent in place with stable biliary ductal dilatation. Left hepatic lobe atrophy. 3. Hepatomegaly.  Spleen is at the upper limits normal in size. 4. Aortic atherosclerosis (ICD10-170.0). Coronary artery calcification.   09/19/2016 Surgery   Diagnostic laparoscopy and possible whipple procedure by Dr. Barry Dienes. Whipple procedure aborted due to the pancreatic cancer involving SMV, SMA and portal veins.   09/19/2016 Pathology Results   Two liver lesions were noted during diagnostic laparoscopy and biopsy revealed benign hepatic parenchyma with assoicated fibrosis with no evidence of malignancy.   10/06/2016 - 10/20/2016 Chemotherapy   Gemcitabine and Abraxane weekly, on day 1, 8, and 15 every 28 days, starting on 10/06/2016, held after1 cycle due to pending surgery at [facility]   10/31/2016 Imaging   CT CAP w contrast at [facility] 10/31/16 Impression: 1.Interval decrease in the size of the ill-defined pancreatic head mass. There is short segment abutment of the portal vein/superior mesenteric vein but no evidence of venous distortion. 2.No evidence of metastatic disease in the chest, abdomen and pelvis.   11/17/2016 -  Hospital Admission   Patient presents to hospital for nausea and vomitting   11/17/2016 Surgery   pancreatecomy, promximal subtotal with total duodenectomy, partial  GASTRECTOMY, CHOLEDOCHOENTEROSTOMY AND GASTROJEJUNOSTOMY (WHIPPLE-TYPE PROCEDURE); WITH PANCREATOJEJUNOSTOMY   01/19/2017 - 03/02/2017 Chemotherapy   Gemcitabine monotherapy: 1000mg /m2, d1,8 Q21d x2 cycles planned    05/18/2017 Imaging   CT CAP at [facility] 05/18/17 Impression: 1.Status post Whipple with soft  tissue density just posterior to the SMA, which may be postsurgical, lymph node, or less likely recurrent disease. Recommend attention on follow-up 2.No evidence of  metastatic disease in the chest, abdomen, and pelvis.     09/20/2017 Imaging   CT CAP at [facility] 09/20/17 Impression: 1. Unchanged subcentimeter soft tissue nodule in the Whipple surgical bed, likely a small lymph node. 2. No evidence of metastatic disease in the chest, abdomen, or pelvis.   01/17/2018 Imaging   01/17/2018 CT CAP Impression: No evidence of recurrent or metastatic disease.   06/13/2018 Imaging   CT CAP W Contrast at [place] on 06/13/18 Impression:  1. Status post Whipple procedure with stable findings at the surgical bed. No convincing evidence of recurrent or metastatic disease within the chest, abdomen, or pelvis.  Electronically Reviewed by:  Gaye Pollack, MD, [facility] Electronically Reviewed on:  06/13/2018 12:15 PM  I have reviewed the images and concur with the above findings.   11/08/2018 Imaging   CT CAP W Contrast  IMPRESSION: 1. No findings of active malignancy. Prior Whipple procedure. Biliary stent in place. 2. Other imaging findings of potential clinical significance: Thoracic scoliosis. Aortic Atherosclerosis (ICD10-I70.0). Multilevel lumbar impingement.     02/06/2019 Imaging   CT CAP W Contrast IMPRESSION: 1. Status post Whipple procedure with no definitive findings to suggest metastatic disease in the chest, abdomen or pelvis. 2. Stable tiny pulmonary nodules in the right upper lobe compared to prior examinations, strongly favored to be benign. 3. Aortic atherosclerosis, in addition to left main and 2 vessel coronary artery disease. Please note that although the presence of coronary artery calcium documents the presence of coronary artery disease, the severity of this disease and any potential stenosis cannot be assessed on this non-gated CT examination. Assessment for potential risk factor modification, dietary therapy or pharmacologic therapy may be warranted, if clinically indicated. 4. Additional incidental findings, as above.     04/2019 Procedure   He had PAC removed in 04/2019   08/14/2019 Imaging   CT CAP at [facility] 08/14/19 IMPRESSION: 1.  History of pancreatic adenocarcinoma status post Whipple with stable findings at the surgical bed. 2.  No evidence of metastatic disease in the chest, abdomen, or pelvis. 3.  Arterial phase hyperenhancing 9 mm lesion in subcapsular portion of hepatic segment 8, favor a flash filling hemangioma. Attention on follow-up.   09/04/2019 PET scan   IMPRESSION: 1. Post Whipple procedure without evidence of local pancreatic carcinoma recurrence. 2. No evidence metastatic disease in the liver or periportal lymph nodes. 3. No distant metastatic disease.   11/11/2019 Imaging   CT CAP Impression:   1. Stable appearance of arterially enhancing 9 mm lesion within segment 8  of the liver. Continued attention on follow-up.  2. No evidence of metastatic disease within the chest, abdomen, or pelvis.  3. Stable appearance of soft tissue density around the common hepatic  artery, for example series 8, image 44. This may represent post-surgical  changes. Continued attention on follow-up.    02/10/2020 Imaging   CT CAP  Impression:  Similar appearance of ill-defined soft tissue surrounding the common  hepatic artery, favored to be postsurgical. Attention on follow-up.   No definite evidence of metastatic disease.   Previously identified arterially enhancing 9 mm lesion in segment 8 of the  liver not well evaluated on today's study due to lack of arterial phase  imaging, possibly flash-filling hemangioma. Recommend attention on  follow-up.       CURRENT THERAPY:  Surveillance  INTERVAL HISTORY:  Colton Bush is here for a follow up of pancreatic cancer. He was last seen by me 7 months ago. He presents to the clinic alone. He denies any new changes. He notes he is doing well. He does note concern with cancer tumor marker trending up at [facility]. He notes his BG will be 100 or less and  will fluctuate during the day. He notes his BM are controlled.     REVIEW OF SYSTEMS:   Constitutional: Denies fevers, chills or abnormal weight loss Eyes: Denies blurriness of vision Ears, nose, mouth, throat, and face: Denies mucositis or sore throat Respiratory: Denies cough, dyspnea or wheezes Cardiovascular: Denies palpitation, chest discomfort or lower extremity swelling Gastrointestinal:  Denies nausea, heartburn or change in bowel habits Skin: Denies abnormal skin rashes Lymphatics: Denies new lymphadenopathy or easy bruising Neurological:Denies numbness, tingling or new weaknesses Behavioral/Psych: Mood is stable, no new changes  All other systems were reviewed with the patient and are negative.  MEDICAL HISTORY:  Past Medical History:  Diagnosis Date  . Arthritis    left hand  . Bronchitis 1977  . Cancer ([place]) 03/09/2016   pancreatic cancer, sees Dr. Cristino Martes at [facility]   . Depression    takes Cymbalta daily  . Diabetes mellitus type II    sees Dr. Chalmers Cater   . GERD (gastroesophageal reflux disease)    takes Omeprazole daily  . H/O hiatal hernia   . Hyperlipidemia    takes Zocor daily  . Hypertension    takes Amlodipine daily  . Hypoglycemia 06/18/2017  . Neck pain    C4-7 stenosis and herniated disc  . Neuromuscular disorder ([place])    hiatal hernia  . Scoliosis    slight  . Spinal cord injury, C5-C7 ([place])    c4-c7  . Stiffness of hand joint    d/t cervical issues    SURGICAL HISTORY: Past Surgical History:  Procedure Laterality Date  . ANTERIOR CERVICAL DECOMP/DISCECTOMY FUSION  08/18/2011   Procedure: ANTERIOR CERVICAL DECOMPRESSION/DISCECTOMY FUSION 3 LEVELS;  Surgeon: Winfield Cunas, MD;  Location: [place] [facility]UR[facility] ORS;  Service: Neurosurgery;  Laterality: N/A;  Anterior Cervical Four-Five/Five-Six/Six-Seven Decompression with Fusion, Plating, and Bonegraft  . CARPAL TUNNEL RELEASE  2013   bilateral, per Dr. Christella Noa   . COLONOSCOPY  10-30-14   per Dr. Olevia Perches,  clear, repeat in 10 yrs   . egd with esophageal dilation  9-08   per Dr. Olevia Perches  . ERCP N/A 03/01/2016   Procedure: ENDOSCOPIC RETROGRADE CHOLANGIOPANCREATOGRAPHY (ERCP) with brushings and stent;  Surgeon: Doran Stabler, MD;  Location: [facility];  Service: Endoscopy;  Laterality: N/A;  . EUS N/A 03/09/2016   Procedure: ESOPHAGEAL ENDOSCOPIC ULTRASOUND (EUS) RADIAL;  Surgeon: Milus Banister, MD;  Location: [facility] ENDOSCOPY;  Service: Endoscopy;  Laterality: N/A;  . lymph nodes biopsy    . melanoma rt calf  1999  . PORT-A-CATH REMOVAL N/A 04/03/2019   Procedure: PORT REMOVAL;  Surgeon: Stark Klein, MD;  Location: [place];  Service: General;  Laterality: N/A;  . PORTACATH PLACEMENT Left 03/22/2016   Procedure: INSERTION PORT-A-CATH;  Surgeon: Stark Klein, MD;  Location: [facility];  Service: General;  Laterality: Left;  . SPINE SURGERY    . TONSILLECTOMY     as a child  . ULNAR TUNNEL RELEASE  2013   right arm, per Dr. Christella Noa   . UPPER GASTROINTESTINAL ENDOSCOPY    . WHIPPLE PROCEDURE N/A 09/19/2016   Procedure: DIAGNOSTIC  LAPAROSCOPY, LAPAROSCOPIC LIVER BIOPSY, RETROPERITONEAL EXPLORATION, INTRAOPERATIVE ULTRASOUND;  Surgeon: Stark Klein, MD;  Location: [place];  Service: General;  Laterality: N/A;    I have reviewed the social history and family history with the patient and they are unchanged from previous note.  ALLERGIES:  has No Known Allergies.  MEDICATIONS:  Current Outpatient Medications  Medication Sig Dispense Refill  . amLODipine (NORVASC) 10 MG tablet Take 1 tablet (10 mg total) by mouth daily. 90 tablet 3  . benazepril (LOTENSIN) 10 MG tablet TAKE 1 TABLET DAILY 90 tablet 0  . CREON 36000 units CPEP capsule Take 2 capsules by mouth 3 (three) times daily before meals.   3  . diphenhydrAMINE (BENADRYL) 25 MG tablet Take 25 mg by mouth at bedtime as needed for sleep.    . ferrous sulfate 325 (65 FE) MG tablet Take 325 mg daily with breakfast by mouth.    Marland Kitchen FREESTYLE LITE test  strip USE ONE STRIP TO CHECK GLUCOSE ONCE DAILY. PLEASE  SCHEDULE FOLLOW UP 50 each 0  . Insulin Detemir (LEVEMIR) 100 UNIT/ML Pen Inject 15 Units into the skin daily at 10 pm. (Patient taking differently: Inject 15 Units into the skin daily. ) 90 mL 3  . Insulin Pen Needle 30G X 5 MM MISC Use one daily with insulin 100 each 2  . KLOR-CON M20 20 MEQ tablet TAKE 1 TABLET BY MOUTH EVERY DAY 90 tablet 3  . Lancets (FREESTYLE) lancets USE AS INSTRUCTED TO CHECK BLOOD SUGAR ONCE A DAY 100 each 2  . lidocaine-prilocaine (EMLA) cream Apply to affected area once (Patient taking differently: Apply 1 application topically daily as needed (port access). ) 30 g 3  . loratadine (CLARITIN) 10 MG tablet Take 10 mg by mouth daily.    . Multiple Vitamin (MULTIVITAMIN WITH MINERALS) TABS tablet Take 1 tablet by mouth daily.    . naproxen sodium (ANAPROX) 220 MG tablet Take 440 mg by mouth 2 (two) times daily as needed (for pain.).    Marland Kitchen neomycin-polymyxin-hydrocortisone (CORTISPORIN) OTIC solution Apply 1-2 drops to toe after soaking BID 10 mL 1  . NOVOLOG FLEXPEN 100 UNIT/ML FlexPen GIVE EVERY MORNING WITH BREAKFAST AND EVERY EVENING WITH SUPPER PER SLIDING SCALE (Patient taking differently: Inject 3-9 Units into the skin 3 (three) times daily with meals. ) 3 mL 0  . Omega-3 Fatty Acids (FISH OIL) 500 MG CAPS Take 500 mg by mouth daily.    Marland Kitchen omeprazole (PRILOSEC) 20 MG capsule TAKE 1 CAPSULE DAILY 90 capsule 3  . oxyCODONE (OXY IR/ROXICODONE) 5 MG immediate release tablet Take 1 tablet (5 mg total) by mouth every 6 (six) hours as needed for severe pain. 10 tablet 0  . rosuvastatin (CRESTOR) 20 MG tablet TAKE 1 TABLET BY MOUTH EVERY DAY OFFICE VIISIT NEEDED FOR FURTURE REFILLS** 30 tablet 2   No current facility-administered medications for this visit.   Facility-Administered Medications Ordered in Other Visits  Medication Dose Route Frequency Provider Last Rate Last Admin  . sodium chloride 0.9 % injection 10 mL   10 mL Intravenous PRN Truitt Merle, MD   10 mL at 02/09/17 1111    PHYSICAL EXAMINATION: ECOG PERFORMANCE STATUS: 0 - Asymptomatic  Vitals:   04/14/20 0940  BP: (!) 142/73  Pulse: 63  Resp: 18  Temp: 97.8 F (36.6 C)  SpO2: 100%   Filed Weights   04/14/20 0940  Weight: 203 lb 12.8 oz (92.4 kg)    GENERAL:alert, no distress and comfortable SKIN: skin  color, texture, turgor are normal, no rashes or significant lesions EYES: normal, Conjunctiva are pink and non-injected, sclera clear  NECK: supple, thyroid normal size, non-tender, without nodularity LYMPH:  no palpable lymphadenopathy in the cervical, axillary  LUNGS: clear to auscultation and percussion with normal breathing effort HEART: regular rate & rhythm and no murmurs and no lower extremity edema ABDOMEN:abdomen soft, non-tender and normal bowel sounds (+) Abdominal hernia  Musculoskeletal:no cyanosis of digits and no clubbing  NEURO: alert & oriented x 3 with fluent speech, no focal motor/sensory deficits  LABORATORY DATA:  I have reviewed the data as listed CBC Latest Ref Rng & Units 04/14/2020 09/04/2019 06/13/2019  WBC 4.0 - 10.5 K/uL 8.0 8.8 7.2  Hemoglobin 13.0 - 17.0 g/dL 14.5 15.3 14.8  Hematocrit 39 - 52 % 41.9 45.1 44.7  Platelets 150 - 400 K/uL 227 244 233     CMP Latest Ref Rng & Units 04/14/2020 09/04/2019 06/13/2019  Glucose 70 - 99 mg/dL 73 174(H) 131(H)  BUN 8 - 23 mg/dL 9 16 11   Creatinine 0.61 - 1.24 mg/dL 0.71 0.71 0.79  Sodium 135 - 145 mmol/L 141 139 141  Potassium 3.5 - 5.1 mmol/L 3.3(L) 4.3 4.1  Chloride 98 - 111 mmol/L 107 102 104  CO2 22 - 32 mmol/L 27 29 28   Calcium 8.9 - 10.3 mg/dL 8.8(L) 9.5 9.3  Total Protein 6.5 - 8.1 g/dL 7.1 8.0 8.0  Total Bilirubin 0.3 - 1.2 mg/dL 0.6 0.8 0.8  Alkaline Phos 38 - 126 U/L 137(H) 116 111  AST 15 - 41 U/L 36 29 39  ALT 0 - 44 U/L 37 45(H) 43      RADIOGRAPHIC STUDIES: I have personally reviewed the radiological images as listed and agreed with the  findings in the report. No results found.   ASSESSMENT & PLAN:  Colton Bush is a 64 y.o. male with    1. Primary pancreatic adenocarcinoma, in pancreatic head, cT2N1M0, stage IIB, ypT2N0M0 -He was diagnosed in 02/2016. He is s/p neoadjuvant FOLFIRINOX, concurrentchemoRT with Xeloda[facility]. -He is currently on surveillance.  -He is clinically doing well and stable, asymptomatic. Labs reviewed, CBC and CMP WNL except K 3.3. Physical Exam unremarkable, except abdominal hernia, non tender.  -His 01/2020 CT scan with [facility] showed stable soft tissue at the surgical bed, no other evidence of metastasis. Dr. Mariah Milling did not recommend biopsy yet. He will f/u with [facility] in 04/2020.  -We discussed his tumor marker has been trending up at [facility] and our labs, overall concerning for cancer recurrence. His CA 19.9 today is still pending. Based on results, I will recommend another CT scan or PET. May recommend further biopsy based on scan findings. He voiced good understanding and agrees with the plan.  -F/u open   2. Type 2 DM, HTN -He will continue medication and follow up with his primary care physician Dr. Sarajane Jews    3. Chronic Diarrhea  -Secondary to whipple surgery  -Well controlled/normal on Creon, will continue. Stable.    Plan -if CA19.9 from today increases significantly, will order PET or CT CAP w contrast for further evaluation, f/u after scan     No problem-specific Assessment & Plan notes found for this encounter.   No orders of the defined types were placed in this encounter.  All questions were answered. The patient knows to call the clinic with any problems, questions or concerns. No barriers to learning was detected. The total time spent in  the appointment was 25 minutes.     Truitt Merle, MD 04/14/2020   I, Joslyn Devon, am acting as scribe for Truitt Merle, MD.   I have reviewed the above documentation for accuracy and completeness, and  I agree with the above.

## 2020-04-14 ENCOUNTER — Inpatient Hospital Stay (HOSPITAL_BASED_OUTPATIENT_CLINIC_OR_DEPARTMENT_OTHER): Payer: Medicare Other | Admitting: Hematology

## 2020-04-14 ENCOUNTER — Other Ambulatory Visit: Payer: Self-pay | Admitting: Family Medicine

## 2020-04-14 ENCOUNTER — Inpatient Hospital Stay: Payer: Medicare Other | Attending: Hematology

## 2020-04-14 ENCOUNTER — Other Ambulatory Visit: Payer: Self-pay

## 2020-04-14 ENCOUNTER — Encounter: Payer: Self-pay | Admitting: Hematology

## 2020-04-14 VITALS — BP 142/73 | HR 63 | Temp 97.8°F | Resp 18 | Ht 71.0 in | Wt 203.8 lb

## 2020-04-14 DIAGNOSIS — K219 Gastro-esophageal reflux disease without esophagitis: Secondary | ICD-10-CM | POA: Diagnosis not present

## 2020-04-14 DIAGNOSIS — C250 Malignant neoplasm of head of pancreas: Secondary | ICD-10-CM

## 2020-04-14 DIAGNOSIS — K529 Noninfective gastroenteritis and colitis, unspecified: Secondary | ICD-10-CM | POA: Insufficient documentation

## 2020-04-14 DIAGNOSIS — E785 Hyperlipidemia, unspecified: Secondary | ICD-10-CM | POA: Diagnosis not present

## 2020-04-14 DIAGNOSIS — Z923 Personal history of irradiation: Secondary | ICD-10-CM | POA: Insufficient documentation

## 2020-04-14 DIAGNOSIS — M1990 Unspecified osteoarthritis, unspecified site: Secondary | ICD-10-CM | POA: Diagnosis not present

## 2020-04-14 DIAGNOSIS — E119 Type 2 diabetes mellitus without complications: Secondary | ICD-10-CM | POA: Diagnosis not present

## 2020-04-14 DIAGNOSIS — Z79899 Other long term (current) drug therapy: Secondary | ICD-10-CM | POA: Insufficient documentation

## 2020-04-14 DIAGNOSIS — Z9221 Personal history of antineoplastic chemotherapy: Secondary | ICD-10-CM | POA: Insufficient documentation

## 2020-04-14 DIAGNOSIS — I10 Essential (primary) hypertension: Secondary | ICD-10-CM | POA: Diagnosis not present

## 2020-04-14 DIAGNOSIS — K5289 Other specified noninfective gastroenteritis and colitis: Secondary | ICD-10-CM | POA: Diagnosis not present

## 2020-04-14 LAB — CBC WITH DIFFERENTIAL/PLATELET
Abs Immature Granulocytes: 0.02 10*3/uL (ref 0.00–0.07)
Basophils Absolute: 0.1 10*3/uL (ref 0.0–0.1)
Basophils Relative: 1 %
Eosinophils Absolute: 0.2 10*3/uL (ref 0.0–0.5)
Eosinophils Relative: 2 %
HCT: 41.9 % (ref 39.0–52.0)
Hemoglobin: 14.5 g/dL (ref 13.0–17.0)
Immature Granulocytes: 0 %
Lymphocytes Relative: 21 %
Lymphs Abs: 1.7 10*3/uL (ref 0.7–4.0)
MCH: 31.2 pg (ref 26.0–34.0)
MCHC: 34.6 g/dL (ref 30.0–36.0)
MCV: 90.1 fL (ref 80.0–100.0)
Monocytes Absolute: 0.9 10*3/uL (ref 0.1–1.0)
Monocytes Relative: 11 %
Neutro Abs: 5.2 10*3/uL (ref 1.7–7.7)
Neutrophils Relative %: 65 %
Platelets: 227 10*3/uL (ref 150–400)
RBC: 4.65 MIL/uL (ref 4.22–5.81)
RDW: 12.9 % (ref 11.5–15.5)
WBC: 8 10*3/uL (ref 4.0–10.5)
nRBC: 0 % (ref 0.0–0.2)

## 2020-04-14 LAB — COMPREHENSIVE METABOLIC PANEL
ALT: 37 U/L (ref 0–44)
AST: 36 U/L (ref 15–41)
Albumin: 3.7 g/dL (ref 3.5–5.0)
Alkaline Phosphatase: 137 U/L — ABNORMAL HIGH (ref 38–126)
Anion gap: 7 (ref 5–15)
BUN: 9 mg/dL (ref 8–23)
CO2: 27 mmol/L (ref 22–32)
Calcium: 8.8 mg/dL — ABNORMAL LOW (ref 8.9–10.3)
Chloride: 107 mmol/L (ref 98–111)
Creatinine, Ser: 0.71 mg/dL (ref 0.61–1.24)
GFR calc Af Amer: >60 mL/min (ref >60)
GFR calc non Af Amer: >60 mL/min (ref >60)
Glucose, Bld: 73 mg/dL (ref 70–99)
Potassium: 3.3 mmol/L — ABNORMAL LOW (ref 3.5–5.1)
Sodium: 141 mmol/L (ref 135–145)
Total Bilirubin: 0.6 mg/dL (ref 0.3–1.2)
Total Protein: 7.1 g/dL (ref 6.5–8.1)

## 2020-04-15 LAB — CANCER ANTIGEN 19-9: CA 19-9: 1124 U/mL — ABNORMAL HIGH (ref 0–35)

## 2020-04-16 ENCOUNTER — Other Ambulatory Visit: Payer: Self-pay | Admitting: Hematology

## 2020-04-16 ENCOUNTER — Encounter: Payer: Self-pay | Admitting: Hematology

## 2020-04-16 DIAGNOSIS — C250 Malignant neoplasm of head of pancreas: Secondary | ICD-10-CM

## 2020-04-20 ENCOUNTER — Telehealth: Payer: Self-pay | Admitting: Hematology

## 2020-04-20 NOTE — Telephone Encounter (Signed)
Scheduled per 9/17 staff message. Pt is aware of appt time and date.

## 2020-04-26 ENCOUNTER — Ambulatory Visit (HOSPITAL_COMMUNITY)
Admission: RE | Admit: 2020-04-26 | Discharge: 2020-04-26 | Disposition: A | Discharge status: Home / Self Care | Payer: Medicare Other | Source: Ambulatory Visit | Attending: Hematology | Admitting: Hematology

## 2020-04-26 ENCOUNTER — Other Ambulatory Visit: Payer: Self-pay

## 2020-04-26 DIAGNOSIS — C250 Malignant neoplasm of head of pancreas: Secondary | ICD-10-CM | POA: Diagnosis not present

## 2020-04-26 DIAGNOSIS — Z90411 Acquired partial absence of pancreas: Secondary | ICD-10-CM | POA: Insufficient documentation

## 2020-04-26 DIAGNOSIS — C259 Malignant neoplasm of pancreas, unspecified: Secondary | ICD-10-CM | POA: Diagnosis not present

## 2020-04-26 LAB — GLUCOSE, CAPILLARY: Glucose-Capillary: 244 mg/dL — ABNORMAL HIGH (ref 70–99)

## 2020-04-26 MED ORDER — FLUDEOXYGLUCOSE F - 18 (FDG) INJECTION
10.7000 | Freq: Once | INTRAVENOUS | Status: AC | PRN
  Administered 2020-04-26: 10.7 via INTRAVENOUS

## 2020-04-28 ENCOUNTER — Encounter: Payer: Self-pay | Admitting: Hematology

## 2020-04-28 ENCOUNTER — Inpatient Hospital Stay (HOSPITAL_BASED_OUTPATIENT_CLINIC_OR_DEPARTMENT_OTHER): Payer: Medicare Other | Admitting: Hematology

## 2020-04-28 ENCOUNTER — Other Ambulatory Visit: Payer: Self-pay

## 2020-04-28 VITALS — BP 149/76 | HR 73 | Temp 98.1°F | Resp 18 | Ht 71.0 in | Wt 205.2 lb

## 2020-04-28 DIAGNOSIS — Z923 Personal history of irradiation: Secondary | ICD-10-CM | POA: Diagnosis not present

## 2020-04-28 DIAGNOSIS — K529 Noninfective gastroenteritis and colitis, unspecified: Secondary | ICD-10-CM | POA: Diagnosis not present

## 2020-04-28 DIAGNOSIS — I10 Essential (primary) hypertension: Secondary | ICD-10-CM

## 2020-04-28 DIAGNOSIS — C250 Malignant neoplasm of head of pancreas: Secondary | ICD-10-CM | POA: Diagnosis not present

## 2020-04-28 DIAGNOSIS — Z9221 Personal history of antineoplastic chemotherapy: Secondary | ICD-10-CM | POA: Diagnosis not present

## 2020-04-28 DIAGNOSIS — K5289 Other specified noninfective gastroenteritis and colitis: Secondary | ICD-10-CM | POA: Diagnosis not present

## 2020-04-28 NOTE — H&P (View-Only) (Signed)
[name]   [phone] [phone]   Clinic Follow up Note   Patient Care Team: Colton Morale, MD as PCP - General  Date of Service:  04/28/2020  CHIEF COMPLAINT: Follow up pancreatic cancer  SUMMARY OF ONCOLOGIC HISTORY: Oncology History Overview Note  Cancer Staging Adenocarcinoma of head of pancreas [facility]) Staging form: Pancreas, AJCC 7th Edition - Clinical stage from 03/09/2016: Stage IIB (T2, N1, M0) - Signed by Colton Merle, MD on 03/17/2016 - Pathologic stage from 11/17/2016: Stage IB (yT2, N0, cM0) - Signed by Colton Merle, MD on 12/10/2016     Adenocarcinoma of head of pancreas ([place])  02/27/2016 Imaging   CT chest, abdomen and pelvis with contrast showed ill-defined heterogeneity of pancreatic head, highly suspicious for malignancy, ) quit about and biliary ductal dilatation, mildly prominent lymph nodes in the upper abdomen, largest 2 cm in the portocaval . No other metastasis.    03/09/2016 Initial Diagnosis   Adenocarcinoma of head of pancreas ([place])   03/09/2016 Procedure   Upper EUS showed a 3.5 cm irregular mass in the pancreatic head, causing pancreatic and biliary duct obstruction. The mass involves the portal vein 422 mm, strongly suggesting invasion. There is suspicious nearby adenopathy   03/09/2016 Initial Biopsy   Fine-needle aspiration of the pancreatic mass from EUS showed malignant cells consistent with adenocarcinoma.    03/28/2016 - 05/24/2016 Chemotherapy   neoadjuvant chemo with FOLFIRINOX every 2 weeks, for 5 cycles    06/28/2016 - 08/10/2016 Radiation Therapy   Neoadjuvant radiation by Dr. Lisbeth Bush Site/dose:   Pancreas treated to 45 Gy in 25 fractions. The Pancreas was then boosted to 54 Gy in 5 fractions.   06/28/2016 - 08/10/2016 Chemotherapy   Xeloda 2000 mg in the morning and 1500 mg in the evening, on the day of radiation.   09/04/2016 Imaging   CT Chest Abdomen Pelvis IMPRESSION: 1. Poorly defined pancreatic head  mass is grossly stable, with associated pancreatic ductal dilatation and portacaval adenopathy. No associated vascular encasement. 2. Common bile duct stent in place with stable biliary ductal dilatation. Left hepatic lobe atrophy. 3. Hepatomegaly.  Spleen is at the upper limits normal in size. 4. Aortic atherosclerosis (ICD10-170.0). Coronary artery calcification.   09/19/2016 Surgery   Diagnostic laparoscopy and possible whipple procedure by Dr. Barry Bush. Whipple procedure aborted due to the pancreatic cancer involving SMV, SMA and portal veins.   09/19/2016 Pathology Results   Two liver lesions were noted during diagnostic laparoscopy and biopsy revealed benign hepatic parenchyma with assoicated fibrosis with no evidence of malignancy.   10/06/2016 - 10/20/2016 Chemotherapy   Gemcitabine and Abraxane weekly, on day 1, 8, and 15 every 28 days, starting on 10/06/2016, held after1 cycle due to pending surgery at [facility]   10/31/2016 Imaging   CT CAP w contrast at [facility] 10/31/16 Impression: 1.Interval decrease in the size of the ill-defined pancreatic head mass. There is short segment abutment of the portal vein/superior mesenteric vein but no evidence of venous distortion. 2.No evidence of metastatic disease in the chest, abdomen and pelvis.   11/17/2016 -  Hospital Admission   Patient presents to hospital for nausea and vomitting   11/17/2016 Surgery   pancreatecomy, promximal subtotal with total duodenectomy, partial  GASTRECTOMY, CHOLEDOCHOENTEROSTOMY AND GASTROJEJUNOSTOMY (WHIPPLE-TYPE PROCEDURE); WITH PANCREATOJEJUNOSTOMY   01/19/2017 - 03/02/2017 Chemotherapy   Gemcitabine monotherapy: 1058m/m2, d1,8 Q21d x2 cycles planned    05/18/2017 [facility]   [facility] Impression: 1.Status post Whipple with soft  tissue density just posterior to the SMA, which may be postsurgical, lymph node, or less likely recurrent disease. Recommend attention on follow-up 2.No evidence of  metastatic disease in the chest, abdomen, and pelvis.     09/20/2017 Imaging   CT CAP at [facility] 09/20/17 Impression: 1. Unchanged subcentimeter soft tissue nodule in the Whipple surgical bed, likely a small lymph node. 2. No evidence of metastatic disease in the chest, abdomen, or pelvis.   01/17/2018 Imaging   01/17/2018 CT CAP Impression: No evidence of recurrent or metastatic disease.   06/13/2018 Imaging   CT CAP W Contrast at [place] on 06/13/18 Impression:  1. Status post Whipple procedure with stable findings at the surgical bed. No convincing evidence of recurrent or metastatic disease within the chest, abdomen, or pelvis.  Electronically Reviewed by:  Colton Pollack, MD, [facility] Electronically Reviewed on:  06/13/2018 12:15 PM  I have reviewed the images and concur with the above findings.   11/08/2018 Imaging   CT CAP W Contrast  IMPRESSION: 1. No findings of active malignancy. Prior Whipple procedure. Biliary stent in place. 2. Other imaging findings of potential clinical significance: Thoracic scoliosis. Aortic Atherosclerosis (ICD10-I70.0). Multilevel lumbar impingement.     02/06/2019 Imaging   CT CAP W Contrast IMPRESSION: 1. Status post Whipple procedure with no definitive findings to suggest metastatic disease in the chest, abdomen or pelvis. 2. Stable tiny pulmonary nodules in the right upper lobe compared to prior examinations, strongly favored to be benign. 3. Aortic atherosclerosis, in addition to left main and 2 vessel coronary artery disease. Please note that although the presence of coronary artery calcium documents the presence of coronary artery disease, the severity of this disease and any potential stenosis cannot be assessed on this non-gated CT examination. Assessment for potential risk factor modification, dietary therapy or pharmacologic therapy may be warranted, if clinically indicated. 4. Additional incidental findings, as above.     04/2019 Procedure   He had PAC removed in 04/2019   08/14/2019 Imaging   CT CAP at [facility] 08/14/19 IMPRESSION: 1.  History of pancreatic adenocarcinoma status post Whipple with stable findings at the surgical bed. 2.  No evidence of metastatic disease in the chest, abdomen, or pelvis. 3.  Arterial phase hyperenhancing 9 mm lesion in subcapsular portion of hepatic segment 8, favor a flash filling hemangioma. Attention on follow-up.   09/04/2019 PET scan   IMPRESSION: 1. Post Whipple procedure without evidence of local pancreatic carcinoma recurrence. 2. No evidence metastatic disease in the liver or periportal lymph nodes. 3. No distant metastatic disease.   11/11/2019 Imaging   CT CAP Impression:   1. Stable appearance of arterially enhancing 9 mm lesion within segment 8  of the liver. Continued attention on follow-up.  2. No evidence of metastatic disease within the chest, abdomen, or pelvis.  3. Stable appearance of soft tissue density around the common hepatic  artery, for example series 8, image 44. This may represent post-surgical  changes. Continued attention on follow-up.    02/10/2020 Imaging   CT CAP  Impression:  Similar appearance of ill-defined soft tissue surrounding the common  hepatic artery, favored to be postsurgical. Attention on follow-up.   No definite evidence of metastatic disease.   Previously identified arterially enhancing 9 mm lesion in segment 8 of the  liver not well evaluated on today's study due to lack of arterial phase  imaging, possibly flash-filling hemangioma. Recommend attention on  follow-up.    04/26/2020 Imaging   PET  IMPRESSION: Status post Whipple procedure.   Soft tissue fullness in the porta hepatis with associated mild hypermetabolism, worrisome for recurrence.   No evidence of distant metastases.      CURRENT THERAPY:  Surveillance  INTERVAL HISTORY:  Colton Bush is here for a follow up of pancreatic cancer. He  presents to the clinic with his family member. He notes he does not have any symptoms recently. He is doing well. He notes Dr Mariah Milling was not alarmed by his tumor marker increasing. He plans to f/u with Dr Mariah Milling in a few weeks. He is interested in Biopsy.    REVIEW OF SYSTEMS:   Constitutional: Denies fevers, chills or abnormal weight loss Eyes: Denies blurriness of vision Ears, nose, mouth, throat, and face: Denies mucositis or sore throat Respiratory: Denies cough, dyspnea or wheezes Cardiovascular: Denies palpitation, chest discomfort or lower extremity swelling Gastrointestinal:  Denies nausea, heartburn or change in bowel habits Skin: Denies abnormal skin rashes Lymphatics: Denies new lymphadenopathy or easy bruising Neurological:Denies numbness, tingling or new weaknesses Behavioral/Psych: Mood is stable, no new changes  All other systems were reviewed with the patient and are negative.  MEDICAL HISTORY:  Past Medical History:  Diagnosis Date  . Arthritis    left hand  . Bronchitis 1977  . Cancer ([place]) 03/09/2016   pancreatic cancer, sees Dr. Cristino Martes at [facility]   . Depression    takes Cymbalta daily  . Diabetes mellitus type II    sees Dr. Chalmers Cater   . GERD (gastroesophageal reflux disease)    takes Omeprazole daily  . H/O hiatal hernia   . Hyperlipidemia    takes Zocor daily  . Hypertension    takes Amlodipine daily  . Hypoglycemia 06/18/2017  . Neck pain    C4-7 stenosis and herniated disc  . Neuromuscular disorder ([place])    hiatal hernia  . Scoliosis    slight  . Spinal cord injury, C5-C7 ([place])    c4-c7  . Stiffness of hand joint    d/t cervical issues    SURGICAL HISTORY: Past Surgical History:  Procedure Laterality Date  . ANTERIOR CERVICAL DECOMP/DISCECTOMY FUSION  08/18/2011   Procedure: ANTERIOR CERVICAL DECOMPRESSION/DISCECTOMY FUSION 3 LEVELS;  Surgeon: Winfield Cunas, MD;  Location: [place] [facility]UR[facility];  Service: Neurosurgery;  Laterality: N/A;  Anterior Cervical  Four-Five/Five-Six/Six-Seven Decompression with Fusion, Plating, and Bonegraft  . CARPAL TUNNEL RELEASE  2013   bilateral, per Dr. Christella Noa   . COLONOSCOPY  10-30-14   per Dr. Olevia Perches, clear, repeat in 10 yrs   . egd with esophageal dilation  9-08   per Dr. Olevia Perches  . ERCP N/A 03/01/2016   Procedure: ENDOSCOPIC RETROGRADE CHOLANGIOPANCREATOGRAPHY (ERCP) with brushings and stent;  Surgeon: Doran Stabler, MD;  Location: [facility];  Service: Endoscopy;  Laterality: N/A;  . EUS N/A 03/09/2016   Procedure: ESOPHAGEAL ENDOSCOPIC ULTRASOUND (EUS) RADIAL;  Surgeon: Milus Banister, MD;  Location: [facility];  Service: Endoscopy;  Laterality: N/A;  . lymph nodes biopsy    . melanoma rt calf  1999  . PORT-A-CATH REMOVAL N/A 04/03/2019   Procedure: PORT REMOVAL;  Surgeon: Stark Klein, MD;  Location: [place];  Service: General;  Laterality: N/A;  . PORTACATH PLACEMENT Left 03/22/2016   Procedure: INSERTION PORT-A-CATH;  Surgeon: Stark Klein, MD;  Location: [facility];  Service: General;  Laterality: Left;  . SPINE SURGERY    . TONSILLECTOMY     as a child  . ULNAR TUNNEL RELEASE  2013   right arm, per Dr. Christella Noa   . UPPER GASTROINTESTINAL ENDOSCOPY    . WHIPPLE PROCEDURE N/A 09/19/2016   Procedure: DIAGNOSTIC LAPAROSCOPY, LAPAROSCOPIC LIVER BIOPSY, RETROPERITONEAL EXPLORATION, INTRAOPERATIVE ULTRASOUND;  Surgeon: Stark Klein, MD;  Location: [place];  Service: General;  Laterality: N/A;    I have reviewed the social history and family history with the patient and they are unchanged from previous note.  ALLERGIES:  has No Known Allergies.  MEDICATIONS:  Current Outpatient Medications  Medication Sig Dispense Refill  . amLODipine (NORVASC) 10 MG tablet Take 1 tablet (10 mg total) by mouth daily. 90 tablet 3  . benazepril (LOTENSIN) 10 MG tablet TAKE 1 TABLET DAILY 90 tablet 0  . CREON 36000 units CPEP capsule Take 2 capsules by mouth 3 (three) times daily before meals.   3  . diphenhydrAMINE  (BENADRYL) 25 MG tablet Take 25 mg by mouth at bedtime as needed for sleep.    . ferrous sulfate 325 (65 FE) MG tablet Take 325 mg daily with breakfast by mouth.    Marland Kitchen FREESTYLE LITE test strip USE ONE STRIP TO CHECK GLUCOSE ONCE DAILY. PLEASE  SCHEDULE FOLLOW UP 50 each 0  . Insulin Detemir (LEVEMIR) 100 UNIT/ML Pen Inject 15 Units into the skin daily at 10 pm. (Patient taking differently: Inject 15 Units into the skin daily. ) 90 mL 3  . Insulin Pen Needle 30G X 5 MM MISC Use one daily with insulin 100 each 2  . KLOR-CON M20 20 MEQ tablet TAKE 1 TABLET BY MOUTH EVERY DAY 90 tablet 3  . Lancets (FREESTYLE) lancets USE AS INSTRUCTED TO CHECK BLOOD SUGAR ONCE A DAY 100 each 2  . lidocaine-prilocaine (EMLA) cream Apply to affected area once (Patient taking differently: Apply 1 application topically daily as needed (port access). ) 30 g 3  . loratadine (CLARITIN) 10 MG tablet Take 10 mg by mouth daily.    . Multiple Vitamin (MULTIVITAMIN WITH MINERALS) TABS tablet Take 1 tablet by mouth daily.    . naproxen sodium (ANAPROX) 220 MG tablet Take 440 mg by mouth 2 (two) times daily as needed (for pain.).    Marland Kitchen neomycin-polymyxin-hydrocortisone (CORTISPORIN) OTIC solution Apply 1-2 drops to toe after soaking BID 10 mL 1  . NOVOLOG FLEXPEN 100 UNIT/ML FlexPen GIVE EVERY MORNING WITH BREAKFAST AND EVERY EVENING WITH SUPPER PER SLIDING SCALE (Patient taking differently: Inject 3-9 Units into the skin 3 (three) times daily with meals. ) 3 mL 0  . Omega-3 Fatty Acids (FISH OIL) 500 MG CAPS Take 500 mg by mouth daily.    Marland Kitchen omeprazole (PRILOSEC) 20 MG capsule TAKE 1 CAPSULE DAILY 90 capsule 3  . oxyCODONE (OXY IR/ROXICODONE) 5 MG immediate release tablet Take 1 tablet (5 mg total) by mouth every 6 (six) hours as needed for severe pain. 10 tablet 0  . rosuvastatin (CRESTOR) 20 MG tablet TAKE 1 TABLET BY MOUTH EVERY DAY OFFICE VIISIT NEEDED FOR FURTURE REFILLS** 30 tablet 2   No current facility-administered  medications for this visit.   Facility-Administered Medications Ordered in Other Visits  Medication Dose Route Frequency Provider Last Rate Last Admin  . sodium chloride 0.9 % injection 10 mL  10 mL Intravenous PRN Colton Merle, MD   10 mL at 02/09/17 1111    PHYSICAL EXAMINATION: ECOG PERFORMANCE STATUS: 0 - Asymptomatic  Vitals:   04/28/20 1137  BP: (!) 149/76  Pulse: 73  Resp: 18  Temp: 98.1 F (36.7 C)  SpO2: 94%  Filed Weights   04/28/20 1137  Weight: 205 lb 3.2 oz (93.1 kg)    Due to COVID19 we will limit examination to appearance. Patient had no complaints.  GENERAL:alert, no distress and comfortable SKIN: skin color normal, no rashes or significant lesions EYES: normal, Conjunctiva are pink and non-injected, sclera clear  NEURO: alert & oriented x 3 with fluent speech    LABORATORY DATA:  I have reviewed the data as listed CBC Latest Ref Rng & Units 04/14/2020 09/04/2019 06/13/2019  WBC 4.0 - 10.5 K/uL 8.0 8.8 7.2  Hemoglobin 13.0 - 17.0 g/dL 14.5 15.3 14.8  Hematocrit 39 - 52 % 41.9 45.1 44.7  Platelets 150 - 400 K/uL 227 244 233     CMP Latest Ref Rng & Units 04/14/2020 09/04/2019 06/13/2019  Glucose 70 - 99 mg/dL 73 174(H) 131(H)  BUN 8 - 23 mg/dL 9 16 11   Creatinine 0.61 - 1.24 mg/dL 0.71 0.71 0.79  Sodium 135 - 145 mmol/L 141 139 141  Potassium 3.5 - 5.1 mmol/L 3.3(L) 4.3 4.1  Chloride 98 - 111 mmol/L 107 102 104  CO2 22 - 32 mmol/L 27 29 28   Calcium 8.9 - 10.3 mg/dL 8.8(L) 9.5 9.3  Total Protein 6.5 - 8.1 g/dL 7.1 8.0 8.0  Total Bilirubin 0.3 - 1.2 mg/dL 0.6 0.8 0.8  Alkaline Phos 38 - 126 U/L 137(H) 116 111  AST 15 - 41 U/L 36 29 39  ALT 0 - 44 U/L 37 45(H) 43      RADIOGRAPHIC STUDIES: I have personally reviewed the radiological images as listed and agreed with the findings in the report. No results found.   ASSESSMENT & PLAN:  JAHZIER VILLALON is a 64 y.o. male with     1. Primary pancreatic adenocarcinoma, in pancreatic head, cT2N1M0, stage  IIB, ypT2N0M0. Local recurrence?  -He was diagnosed in 02/2016. He is s/p neoadjuvant FOLFIRINOX, concurrentchemoRT with Xeloda[facility].He is currently on surveillance. -His 01/2020 CT scan with [facility] showed stable soft tissue at the surgical bed, no other evidence of metastasis. Dr. Mariah Milling did not recommend biopsy yet. He will f/u with [facility] in 04/2020.  -His tumor marker has been trending up at [facility] and our labs, overall concerning for cancer recurrence. -I personally reviewed the images and discussed his PET from 04/26/20 with pt and his wife which shows hypermetabolic uptake at the porta hepatis surgical site which is suspicious for local cancer recurrence. No evidence of distant metastasis. He remains asymptomatic.  -I discussed biopsy is definitive, but location is not easy to biopsy. Biopsy by endoscopy [place] may be possible with Dr Ardis Hughs. If not feasible, I will consult IR and Dr Mariah Milling at [facility] about biopsy. Patient is agreeable with Biopsy.  -I discussed if he is found to have local cancer recurrence, another surgery will likely not be curative or recommended. I discussed standard treatment would be chemotherapy to control disease and prolong his life. I also discussed the role of target Radiation.  -Labs reviewed from this week, CBC and CMP WNL except K 3.3, Ca 8.8, Alk Phos 137. CA 19-9 continues to increase.  -F/u open    2. Type 2 DM, HTN -He will continue medication and follow up with his primary care physician Dr. Sarajane Jews  3. Chronic Diarrhea  -Secondary to whipple surgery  -Well controlled/normal on Creon, will continue.Stable.  -I encouraged he continues to eat adequately and reduce greasy foods in diet.    Plan -PET scan reviewed  today, concerning for local recurrence at surgical bed -I sent a message to Dr. Ardis Hughs to see if he can offer EUS biopsy  -Copy note to Dr Mariah Milling  -f/u after biopsy    No problem-specific Assessment & Plan  notes found for this encounter.   No orders of the defined types were placed in this encounter.  All questions were answered. The patient knows to call the clinic with any problems, questions or concerns. No barriers to learning was detected. The total time spent in the appointment was 30 minutes.     Colton Merle, MD 04/28/2020   I, Joslyn Devon, am acting as scribe for Colton Merle, MD.   I have reviewed the above documentation for accuracy and completeness, and I agree with the above.

## 2020-04-28 NOTE — Progress Notes (Signed)
[place]   [phone] [phone]   Clinic Follow up Note   Patient Care Team: [name], MD as PCP - General  Date of Service:  04/28/2020  CHIEF COMPLAINT: Follow up pancreatic cancer  SUMMARY OF ONCOLOGIC HISTORY: Oncology History Overview Note  Cancer Staging Adenocarcinoma of head of pancreas [facility]) Staging form: Pancreas, AJCC 7th Edition - Clinical stage from 03/09/2016: Stage IIB (T2, N1, M0) - Signed by Truitt Merle, MD on 03/17/2016 - Pathologic stage from 11/17/2016: Stage IB (yT2, N0, cM0) - Signed by Truitt Merle, MD on 12/10/2016     Adenocarcinoma of head of pancreas ([place])  02/27/2016 Imaging   CT chest, abdomen and pelvis with contrast showed ill-defined heterogeneity of pancreatic head, highly suspicious for malignancy, ) quit about and biliary ductal dilatation, mildly prominent lymph nodes in the upper abdomen, largest 2 cm in the portocaval . No other metastasis.    03/09/2016 Initial Diagnosis   Adenocarcinoma of head of pancreas ([place])   03/09/2016 Procedure   Upper EUS showed a 3.5 cm irregular mass in the pancreatic head, causing pancreatic and biliary duct obstruction. The mass involves the portal vein 422 mm, strongly suggesting invasion. There is suspicious nearby adenopathy   03/09/2016 Initial Biopsy   Fine-needle aspiration of the pancreatic mass from EUS showed malignant cells consistent with adenocarcinoma.    03/28/2016 - 05/24/2016 Chemotherapy   neoadjuvant chemo with FOLFIRINOX every 2 weeks, for 5 cycles    06/28/2016 - 08/10/2016 Radiation Therapy   Neoadjuvant radiation by Dr. [name] Site/dose:   Pancreas treated to 45 Gy in 25 fractions. The Pancreas was then boosted to 54 Gy in 5 fractions.   06/28/2016 - 08/10/2016 Chemotherapy   Xeloda 2000 mg in the morning and 1500 mg in the evening, on the day of radiation.   09/04/2016 Imaging   CT Chest Abdomen Pelvis IMPRESSION: 1. Poorly defined pancreatic head  mass is grossly stable, with associated pancreatic ductal dilatation and portacaval adenopathy. No associated vascular encasement. 2. Common bile duct stent in place with stable biliary ductal dilatation. Left hepatic lobe atrophy. 3. Hepatomegaly.  Spleen is at the upper limits normal in size. 4. Aortic atherosclerosis (ICD10-170.0). Coronary artery calcification.   09/19/2016 Surgery   Diagnostic laparoscopy and possible whipple procedure by Dr. Barry Dienes. Whipple procedure aborted due to the pancreatic cancer involving SMV, SMA and portal veins.   09/19/2016 Pathology Results   Two liver lesions were noted during diagnostic laparoscopy and biopsy revealed benign hepatic parenchyma with assoicated fibrosis with no evidence of malignancy.   10/06/2016 - 10/20/2016 Chemotherapy   Gemcitabine and Abraxane weekly, on day 1, 8, and 15 every 28 days, starting on 10/06/2016, held after1 cycle due to pending surgery at [facility]   10/31/2016 Imaging   CT CAP w contrast at [facility] 10/31/16 Impression: 1.Interval decrease in the size of the ill-defined pancreatic head mass. There is short segment abutment of the portal vein/superior mesenteric vein but no evidence of venous distortion. 2.No evidence of metastatic disease in the chest, abdomen and pelvis.   11/17/2016 -  Hospital Admission   Patient presents to hospital for nausea and vomitting   11/17/2016 Surgery   pancreatecomy, promximal subtotal with total duodenectomy, partial  GASTRECTOMY, CHOLEDOCHOENTEROSTOMY AND GASTROJEJUNOSTOMY (WHIPPLE-TYPE PROCEDURE); WITH PANCREATOJEJUNOSTOMY   01/19/2017 - 03/02/2017 Chemotherapy   Gemcitabine monotherapy: 1056m/m2, d1,8 Q21d x2 cycles planned    05/18/2017 [facility]   [facility] Impression: 1.Status post Whipple with soft  tissue density just posterior to the SMA, which may be postsurgical, lymph node, or less likely recurrent disease. Recommend attention on follow-up 2.No evidence of  metastatic disease in the chest, abdomen, and pelvis.     09/20/2017 Imaging   CT CAP at [facility] 09/20/17 Impression: 1. Unchanged subcentimeter soft tissue nodule in the Whipple surgical bed, likely a small lymph node. 2. No evidence of metastatic disease in the chest, abdomen, or pelvis.   01/17/2018 Imaging   01/17/2018 CT CAP Impression: No evidence of recurrent or metastatic disease.   06/13/2018 Imaging   CT CAP W Contrast at [place] on 06/13/18 Impression:  1. Status post Whipple procedure with stable findings at the surgical bed. No convincing evidence of recurrent or metastatic disease within the chest, abdomen, or pelvis.  Electronically Reviewed by:  Gaye Pollack, MD, [facility] Electronically Reviewed on:  06/13/2018 12:15 PM  I have reviewed the images and concur with the above findings.   11/08/2018 Imaging   CT CAP W Contrast  IMPRESSION: 1. No findings of active malignancy. Prior Whipple procedure. Biliary stent in place. 2. Other imaging findings of potential clinical significance: Thoracic scoliosis. Aortic Atherosclerosis (ICD10-I70.0). Multilevel lumbar impingement.     02/06/2019 Imaging   CT CAP W Contrast IMPRESSION: 1. Status post Whipple procedure with no definitive findings to suggest metastatic disease in the chest, abdomen or pelvis. 2. Stable tiny pulmonary nodules in the right upper lobe compared to prior examinations, strongly favored to be benign. 3. Aortic atherosclerosis, in addition to left main and 2 vessel coronary artery disease. Please note that although the presence of coronary artery calcium documents the presence of coronary artery disease, the severity of this disease and any potential stenosis cannot be assessed on this non-gated CT examination. Assessment for potential risk factor modification, dietary therapy or pharmacologic therapy may be warranted, if clinically indicated. 4. Additional incidental findings, as above.     04/2019 Procedure   He had PAC removed in 04/2019   08/14/2019 Imaging   CT CAP at [facility] 08/14/19 IMPRESSION: 1.  History of pancreatic adenocarcinoma status post Whipple with stable findings at the surgical bed. 2.  No evidence of metastatic disease in the chest, abdomen, or pelvis. 3.  Arterial phase hyperenhancing 9 mm lesion in subcapsular portion of hepatic segment 8, favor a flash filling hemangioma. Attention on follow-up.   09/04/2019 PET scan   IMPRESSION: 1. Post Whipple procedure without evidence of local pancreatic carcinoma recurrence. 2. No evidence metastatic disease in the liver or periportal lymph nodes. 3. No distant metastatic disease.   11/11/2019 Imaging   CT CAP Impression:   1. Stable appearance of arterially enhancing 9 mm lesion within segment 8  of the liver. Continued attention on follow-up.  2. No evidence of metastatic disease within the chest, abdomen, or pelvis.  3. Stable appearance of soft tissue density around the common hepatic  artery, for example series 8, image 44. This may represent post-surgical  changes. Continued attention on follow-up.    02/10/2020 Imaging   CT CAP  Impression:  Similar appearance of ill-defined soft tissue surrounding the common  hepatic artery, favored to be postsurgical. Attention on follow-up.   No definite evidence of metastatic disease.   Previously identified arterially enhancing 9 mm lesion in segment 8 of the  liver not well evaluated on today's study due to lack of arterial phase  imaging, possibly flash-filling hemangioma. Recommend attention on  follow-up.    04/26/2020 Imaging   PET  IMPRESSION: Status post Whipple procedure.   Soft tissue fullness in the porta hepatis with associated mild hypermetabolism, worrisome for recurrence.   No evidence of distant metastases.      CURRENT THERAPY:  Surveillance  INTERVAL HISTORY:  Colton Bush is here for a follow up of pancreatic cancer. He  presents to the clinic with his family member. He notes he does not have any symptoms recently. He is doing well. He notes Dr Mariah Milling was not alarmed by his tumor marker increasing. He plans to f/u with Dr Mariah Milling in a few weeks. He is interested in Biopsy.    REVIEW OF SYSTEMS:   Constitutional: Denies fevers, chills or abnormal weight loss Eyes: Denies blurriness of vision Ears, nose, mouth, throat, and face: Denies mucositis or sore throat Respiratory: Denies cough, dyspnea or wheezes Cardiovascular: Denies palpitation, chest discomfort or lower extremity swelling Gastrointestinal:  Denies nausea, heartburn or change in bowel habits Skin: Denies abnormal skin rashes Lymphatics: Denies new lymphadenopathy or easy bruising Neurological:Denies numbness, tingling or new weaknesses Behavioral/Psych: Mood is stable, no new changes  All other systems were reviewed with the patient and are negative.  MEDICAL HISTORY:  Past Medical History:  Diagnosis Date  . Arthritis    left hand  . Bronchitis 1977  . Cancer ([place]) 03/09/2016   pancreatic cancer, sees Dr. Cristino Martes at [facility]   . Depression    takes Cymbalta daily  . Diabetes mellitus type II    sees Dr. Chalmers Cater   . GERD (gastroesophageal reflux disease)    takes Omeprazole daily  . H/O hiatal hernia   . Hyperlipidemia    takes Zocor daily  . Hypertension    takes Amlodipine daily  . Hypoglycemia 06/18/2017  . Neck pain    C4-7 stenosis and herniated disc  . Neuromuscular disorder ([place])    hiatal hernia  . Scoliosis    slight  . Spinal cord injury, C5-C7 ([place])    c4-c7  . Stiffness of hand joint    d/t cervical issues    SURGICAL HISTORY: Past Surgical History:  Procedure Laterality Date  . ANTERIOR CERVICAL DECOMP/DISCECTOMY FUSION  08/18/2011   Procedure: ANTERIOR CERVICAL DECOMPRESSION/DISCECTOMY FUSION 3 LEVELS;  Surgeon: Winfield Cunas, MD;  Location: [place] NEURO ORS;  Service: Neurosurgery;  Laterality: N/A;  Anterior Cervical  Four-Five/Five-Six/Six-Seven Decompression with Fusion, Plating, and Bonegraft  . CARPAL TUNNEL RELEASE  2013   bilateral, per Dr. Christella Noa   . COLONOSCOPY  10-30-14   per Dr. Olevia Perches, clear, repeat in 10 yrs   . egd with esophageal dilation  9-08   per Dr. Olevia Perches  . ERCP N/A 03/01/2016   Procedure: ENDOSCOPIC RETROGRADE CHOLANGIOPANCREATOGRAPHY (ERCP) with brushings and stent;  Surgeon: Doran Stabler, MD;  Location: [facility] ENDOSCOPY;  Service: Endoscopy;  Laterality: N/A;  . EUS N/A 03/09/2016   Procedure: ESOPHAGEAL ENDOSCOPIC ULTRASOUND (EUS) RADIAL;  Surgeon: Milus Banister, MD;  Location: [facility] ENDOSCOPY;  Service: Endoscopy;  Laterality: N/A;  . lymph nodes biopsy    . melanoma rt calf  1999  . PORT-A-CATH REMOVAL N/A 04/03/2019   Procedure: PORT REMOVAL;  Surgeon: Stark Klein, MD;  Location: [place];  Service: General;  Laterality: N/A;  . PORTACATH PLACEMENT Left 03/22/2016   Procedure: INSERTION PORT-A-CATH;  Surgeon: Stark Klein, MD;  Location: [facility] ORS;  Service: General;  Laterality: Left;  . SPINE SURGERY    . TONSILLECTOMY     as a child  . ULNAR TUNNEL RELEASE  2013   right arm, per Dr. Christella Noa   . UPPER GASTROINTESTINAL ENDOSCOPY    . WHIPPLE PROCEDURE N/A 09/19/2016   Procedure: DIAGNOSTIC LAPAROSCOPY, LAPAROSCOPIC LIVER BIOPSY, RETROPERITONEAL EXPLORATION, INTRAOPERATIVE ULTRASOUND;  Surgeon: Stark Klein, MD;  Location: [place];  Service: General;  Laterality: N/A;    I have reviewed the social history and family history with the patient and they are unchanged from previous note.  ALLERGIES:  has No Known Allergies.  MEDICATIONS:  Current Outpatient Medications  Medication Sig Dispense Refill  . amLODipine (NORVASC) 10 MG tablet Take 1 tablet (10 mg total) by mouth daily. 90 tablet 3  . benazepril (LOTENSIN) 10 MG tablet TAKE 1 TABLET DAILY 90 tablet 0  . CREON 36000 units CPEP capsule Take 2 capsules by mouth 3 (three) times daily before meals.   3  . diphenhydrAMINE  (BENADRYL) 25 MG tablet Take 25 mg by mouth at bedtime as needed for sleep.    . ferrous sulfate 325 (65 FE) MG tablet Take 325 mg daily with breakfast by mouth.    Marland Kitchen FREESTYLE LITE test strip USE ONE STRIP TO CHECK GLUCOSE ONCE DAILY. PLEASE  SCHEDULE FOLLOW UP 50 each 0  . Insulin Detemir (LEVEMIR) 100 UNIT/ML Pen Inject 15 Units into the skin daily at 10 pm. (Patient taking differently: Inject 15 Units into the skin daily. ) 90 mL 3  . Insulin Pen Needle 30G X 5 MM MISC Use one daily with insulin 100 each 2  . KLOR-CON M20 20 MEQ tablet TAKE 1 TABLET BY MOUTH EVERY DAY 90 tablet 3  . Lancets (FREESTYLE) lancets USE AS INSTRUCTED TO CHECK BLOOD SUGAR ONCE A DAY 100 each 2  . lidocaine-prilocaine (EMLA) cream Apply to affected area once (Patient taking differently: Apply 1 application topically daily as needed (port access). ) 30 g 3  . loratadine (CLARITIN) 10 MG tablet Take 10 mg by mouth daily.    . Multiple Vitamin (MULTIVITAMIN WITH MINERALS) TABS tablet Take 1 tablet by mouth daily.    . naproxen sodium (ANAPROX) 220 MG tablet Take 440 mg by mouth 2 (two) times daily as needed (for pain.).    Marland Kitchen neomycin-polymyxin-hydrocortisone (CORTISPORIN) OTIC solution Apply 1-2 drops to toe after soaking BID 10 mL 1  . NOVOLOG FLEXPEN 100 UNIT/ML FlexPen GIVE EVERY MORNING WITH BREAKFAST AND EVERY EVENING WITH SUPPER PER SLIDING SCALE (Patient taking differently: Inject 3-9 Units into the skin 3 (three) times daily with meals. ) 3 mL 0  . Omega-3 Fatty Acids (FISH OIL) 500 MG CAPS Take 500 mg by mouth daily.    Marland Kitchen omeprazole (PRILOSEC) 20 MG capsule TAKE 1 CAPSULE DAILY 90 capsule 3  . oxyCODONE (OXY IR/ROXICODONE) 5 MG immediate release tablet Take 1 tablet (5 mg total) by mouth every 6 (six) hours as needed for severe pain. 10 tablet 0  . rosuvastatin (CRESTOR) 20 MG tablet TAKE 1 TABLET BY MOUTH EVERY DAY OFFICE VIISIT NEEDED FOR FURTURE REFILLS** 30 tablet 2   No current facility-administered  medications for this visit.   Facility-Administered Medications Ordered in Other Visits  Medication Dose Route Frequency Provider Last Rate Last Admin  . sodium chloride 0.9 % injection 10 mL  10 mL Intravenous PRN Truitt Merle, MD   10 mL at 02/09/17 1111    PHYSICAL EXAMINATION: ECOG PERFORMANCE STATUS: 0 - Asymptomatic  Vitals:   04/28/20 1137  BP: (!) 149/76  Pulse: 73  Resp: 18  Temp: 98.1 F (36.7 C)  SpO2: 94%  Filed Weights   04/28/20 1137  Weight: 205 lb 3.2 oz (93.1 kg)    Due to COVID19 we will limit examination to appearance. Patient had no complaints.  GENERAL:alert, no distress and comfortable SKIN: skin color normal, no rashes or significant lesions EYES: normal, Conjunctiva are pink and non-injected, sclera clear  NEURO: alert & oriented x 3 with fluent speech    LABORATORY DATA:  I have reviewed the data as listed CBC Latest Ref Rng & Units 04/14/2020 09/04/2019 06/13/2019  WBC 4.0 - 10.5 K/uL 8.0 8.8 7.2  Hemoglobin 13.0 - 17.0 g/dL 14.5 15.3 14.8  Hematocrit 39 - 52 % 41.9 45.1 44.7  Platelets 150 - 400 K/uL 227 244 233     CMP Latest Ref Rng & Units 04/14/2020 09/04/2019 06/13/2019  Glucose 70 - 99 mg/dL 73 174(H) 131(H)  BUN 8 - 23 mg/dL 9 16 11   Creatinine 0.61 - 1.24 mg/dL 0.71 0.71 0.79  Sodium 135 - 145 mmol/L 141 139 141  Potassium 3.5 - 5.1 mmol/L 3.3(L) 4.3 4.1  Chloride 98 - 111 mmol/L 107 102 104  CO2 22 - 32 mmol/L 27 29 28   Calcium 8.9 - 10.3 mg/dL 8.8(L) 9.5 9.3  Total Protein 6.5 - 8.1 g/dL 7.1 8.0 8.0  Total Bilirubin 0.3 - 1.2 mg/dL 0.6 0.8 0.8  Alkaline Phos 38 - 126 U/L 137(H) 116 111  AST 15 - 41 U/L 36 29 39  ALT 0 - 44 U/L 37 45(H) 43      RADIOGRAPHIC STUDIES: I have personally reviewed the radiological images as listed and agreed with the findings in the report. No results found.   ASSESSMENT & PLAN:  Colton Bush is a 65 y.o. male with     1. Primary pancreatic adenocarcinoma, in pancreatic head, cT2N1M0, stage  IIB, ypT2N0M0. Local recurrence?  -He was diagnosed in 02/2016. He is s/p neoadjuvant FOLFIRINOX, concurrentchemoRT with Xeloda[facility].He is currently on surveillance. -His 01/2020 CT scan with [facility] showed stable soft tissue at the surgical bed, no other evidence of metastasis. Dr. Mariah Milling did not recommend biopsy yet. He will f/u with [facility] in 04/2020.  -His tumor marker has been trending up at [facility] and our labs, overall concerning for cancer recurrence. -I personally reviewed the images and discussed his PET from 04/26/20 with pt and his wife which shows hypermetabolic uptake at the porta hepatis surgical site which is suspicious for local cancer recurrence. No evidence of distant metastasis. He remains asymptomatic.  -I discussed biopsy is definitive, but location is not easy to biopsy. Biopsy by endoscopy [place] may be possible with Dr Ardis Hughs. If not feasible, I will consult IR and Dr Mariah Milling at [facility] about biopsy. Patient is agreeable with Biopsy.  -I discussed if he is found to have local cancer recurrence, another surgery will likely not be curative or recommended. I discussed standard treatment would be chemotherapy to control disease and prolong his life. I also discussed the role of target Radiation.  -Labs reviewed from this week, CBC and CMP WNL except K 3.3, Ca 8.8, Alk Phos 137. CA 19-9 continues to increase.  -F/u open    2. Type 2 DM, HTN -He will continue medication and follow up with his primary care physician Dr. Sarajane Jews  3. Chronic Diarrhea  -Secondary to whipple surgery  -Well controlled/normal on Creon, will continue.Stable.  -I encouraged he continues to eat adequately and reduce greasy foods in diet.    Plan -PET scan reviewed  today, concerning for local recurrence at surgical bed -I sent a message to Dr. Ardis Hughs to see if he can offer EUS biopsy  -Copy note to Dr Mariah Milling  -f/u after biopsy    No problem-specific Assessment & Plan  notes found for this encounter.   No orders of the defined types were placed in this encounter.  All questions were answered. The patient knows to call the clinic with any problems, questions or concerns. No barriers to learning was detected. The total time spent in the appointment was 30 minutes.     Truitt Merle, MD 04/28/2020   I, Joslyn Devon, am acting as scribe for Truitt Merle, MD.   I have reviewed the above documentation for accuracy and completeness, and I agree with the above.

## 2020-04-29 ENCOUNTER — Encounter: Payer: Self-pay | Admitting: Hematology

## 2020-04-29 ENCOUNTER — Telehealth: Payer: Self-pay

## 2020-04-29 ENCOUNTER — Other Ambulatory Visit: Payer: Self-pay

## 2020-04-29 DIAGNOSIS — R1900 Intra-abdominal and pelvic swelling, mass and lump, unspecified site: Secondary | ICD-10-CM

## 2020-04-29 DIAGNOSIS — C250 Malignant neoplasm of head of pancreas: Secondary | ICD-10-CM

## 2020-04-29 NOTE — Telephone Encounter (Signed)
-----   Message from Milus Banister, MD sent at 04/29/2020  6:09 AM EDT ----- Will do, thanks  Oluwaseun Cremer, Can you contact him and arrange EUS my first available for h/o pancfreatic cancer, new mass in abdomen.  Thanks  ----- Message ----- From: Truitt Merle, MD Sent: 04/28/2020   5:38 PM EDT To: Milus Banister, MD  Thanks much [name]. Yes, I told him it will be challenge after showing him the scan images. He is willing to try. Please let your office call him to set up.  Thanks  Krista Blue  ----- Message ----- From: Milus Banister, MD Sent: 04/28/2020   4:42 PM EDT To: Truitt Merle, MD  Worth a shot but will be challenging (or impossible) with his post op anatomy. There looks to be bowel between his gastric lumen and the target area on CT.  Never know how the organs will shift when we lay them on their sides though.  If he is agreeable and understands chances are probably not great, I am definitely happy to give it a try.  Let me know  Thanks ----- Message ----- From: Truitt Merle, MD Sent: 04/28/2020   7:29 AM EDT To: Milus Banister, MD  Dan,  When you get a chance, could you review his recent PET to see if you can do EUS biopsy of the hypermetabolic soft tissue at porta hepatis? His CA19.9 has been rising lately concerning for recurrence. Thanks   Krista Blue

## 2020-04-29 NOTE — Telephone Encounter (Signed)
EUS scheduled for 05/27/20 at 1015 am at [facility] with Dr Ardis Hughs.  COVID testing on 05/24/20 at 1020 am.    EUS scheduled, pt instructed and medications reviewed. Patient instructions mailed to home.  Patient to call with any questions or concerns.

## 2020-05-11 DIAGNOSIS — Z9049 Acquired absence of other specified parts of digestive tract: Secondary | ICD-10-CM | POA: Diagnosis not present

## 2020-05-11 DIAGNOSIS — Z90411 Acquired partial absence of pancreas: Secondary | ICD-10-CM | POA: Diagnosis not present

## 2020-05-11 DIAGNOSIS — C250 Malignant neoplasm of head of pancreas: Secondary | ICD-10-CM | POA: Diagnosis not present

## 2020-05-12 DIAGNOSIS — Z23 Encounter for immunization: Secondary | ICD-10-CM | POA: Diagnosis not present

## 2020-05-13 DIAGNOSIS — C259 Malignant neoplasm of pancreas, unspecified: Secondary | ICD-10-CM | POA: Diagnosis not present

## 2020-05-13 DIAGNOSIS — I10 Essential (primary) hypertension: Secondary | ICD-10-CM | POA: Diagnosis not present

## 2020-05-13 DIAGNOSIS — K8689 Other specified diseases of pancreas: Secondary | ICD-10-CM | POA: Diagnosis not present

## 2020-05-13 DIAGNOSIS — E109 Type 1 diabetes mellitus without complications: Secondary | ICD-10-CM | POA: Diagnosis not present

## 2020-05-13 DIAGNOSIS — E7800 Pure hypercholesterolemia, unspecified: Secondary | ICD-10-CM | POA: Diagnosis not present

## 2020-05-24 ENCOUNTER — Other Ambulatory Visit (HOSPITAL_COMMUNITY)
Admission: RE | Admit: 2020-05-24 | Discharge: 2020-05-24 | Disposition: A | Discharge status: Home / Self Care | Payer: Medicare Other | Source: Ambulatory Visit | Attending: Gastroenterology | Admitting: Gastroenterology

## 2020-05-24 DIAGNOSIS — Z20822 Contact with and (suspected) exposure to covid-19: Secondary | ICD-10-CM | POA: Diagnosis not present

## 2020-05-24 DIAGNOSIS — Z01812 Encounter for preprocedural laboratory examination: Secondary | ICD-10-CM | POA: Diagnosis not present

## 2020-05-24 LAB — SARS CORONAVIRUS 2 (TAT 6-24 HRS): SARS Coronavirus 2: NEGATIVE

## 2020-05-27 ENCOUNTER — Ambulatory Visit (HOSPITAL_COMMUNITY)
Admission: RE | Admit: 2020-05-27 | Discharge: 2020-05-27 | Disposition: A | Discharge status: Home / Self Care | Payer: Medicare Other | Attending: Gastroenterology | Admitting: Gastroenterology

## 2020-05-27 ENCOUNTER — Encounter (HOSPITAL_COMMUNITY): Payer: Self-pay | Admitting: Gastroenterology

## 2020-05-27 ENCOUNTER — Encounter (HOSPITAL_COMMUNITY)
Admission: RE | Disposition: A | Discharge status: Home / Self Care | Payer: Self-pay | Source: Home / Self Care | Attending: Gastroenterology

## 2020-05-27 ENCOUNTER — Ambulatory Visit (HOSPITAL_COMMUNITY): Payer: Medicare Other | Admitting: Certified Registered Nurse Anesthetist

## 2020-05-27 ENCOUNTER — Other Ambulatory Visit: Payer: Self-pay

## 2020-05-27 DIAGNOSIS — Z90411 Acquired partial absence of pancreas: Secondary | ICD-10-CM | POA: Insufficient documentation

## 2020-05-27 DIAGNOSIS — Z980 Intestinal bypass and anastomosis status: Secondary | ICD-10-CM | POA: Diagnosis not present

## 2020-05-27 DIAGNOSIS — E782 Mixed hyperlipidemia: Secondary | ICD-10-CM | POA: Diagnosis not present

## 2020-05-27 DIAGNOSIS — R935 Abnormal findings on diagnostic imaging of other abdominal regions, including retroperitoneum: Secondary | ICD-10-CM | POA: Insufficient documentation

## 2020-05-27 DIAGNOSIS — Z8507 Personal history of malignant neoplasm of pancreas: Secondary | ICD-10-CM | POA: Insufficient documentation

## 2020-05-27 DIAGNOSIS — R1900 Intra-abdominal and pelvic swelling, mass and lump, unspecified site: Secondary | ICD-10-CM | POA: Diagnosis not present

## 2020-05-27 DIAGNOSIS — E119 Type 2 diabetes mellitus without complications: Secondary | ICD-10-CM | POA: Insufficient documentation

## 2020-05-27 DIAGNOSIS — Z9221 Personal history of antineoplastic chemotherapy: Secondary | ICD-10-CM | POA: Diagnosis not present

## 2020-05-27 DIAGNOSIS — R970 Elevated carcinoembryonic antigen [CEA]: Secondary | ICD-10-CM | POA: Diagnosis not present

## 2020-05-27 DIAGNOSIS — Z923 Personal history of irradiation: Secondary | ICD-10-CM | POA: Diagnosis not present

## 2020-05-27 DIAGNOSIS — I10 Essential (primary) hypertension: Secondary | ICD-10-CM | POA: Diagnosis not present

## 2020-05-27 DIAGNOSIS — C250 Malignant neoplasm of head of pancreas: Secondary | ICD-10-CM

## 2020-05-27 HISTORY — PX: ESOPHAGOGASTRODUODENOSCOPY (EGD) WITH PROPOFOL: SHX5813

## 2020-05-27 HISTORY — PX: EUS: SHX5427

## 2020-05-27 LAB — GLUCOSE, CAPILLARY: Glucose-Capillary: 101 mg/dL — ABNORMAL HIGH (ref 70–99)

## 2020-05-27 SURGERY — UPPER ENDOSCOPIC ULTRASOUND (EUS) RADIAL
Anesthesia: Monitor Anesthesia Care

## 2020-05-27 MED ORDER — LACTATED RINGERS IV SOLN: INTRAVENOUS | Status: DC | PRN

## 2020-05-27 MED ORDER — SODIUM CHLORIDE 0.9 % IV SOLN: INTRAVENOUS | Status: DC

## 2020-05-27 MED ORDER — GLYCOPYRROLATE PF 0.2 MG/ML IJ SOSY
PREFILLED_SYRINGE | INTRAMUSCULAR | Status: DC | PRN
  Administered 2020-05-27: .2 mg via INTRAVENOUS

## 2020-05-27 MED ORDER — PROPOFOL 500 MG/50ML IV EMUL
INTRAVENOUS | Status: AC
  Filled 2020-05-27: qty 50

## 2020-05-27 MED ORDER — PROPOFOL 500 MG/50ML IV EMUL
INTRAVENOUS | Status: DC | PRN
  Administered 2020-05-27: 150 ug/kg/min via INTRAVENOUS

## 2020-05-27 MED ORDER — LACTATED RINGERS IV SOLN
INTRAVENOUS | Status: DC
  Administered 2020-05-27: 1000 mL via INTRAVENOUS

## 2020-05-27 MED ORDER — PROPOFOL 500 MG/50ML IV EMUL
INTRAVENOUS | Status: DC | PRN
  Administered 2020-05-27 (×2): 50 ug via INTRAVENOUS

## 2020-05-27 MED ORDER — LIDOCAINE 20MG/ML (2%) 15 ML SYRINGE OPTIME
INTRAMUSCULAR | Status: DC | PRN
  Administered 2020-05-27: 100 mg via INTRAVENOUS

## 2020-05-27 SURGICAL SUPPLY — 15 items

## 2020-05-27 NOTE — Interval H&P Note (Signed)
History and Physical Interval Note:  05/27/2020 8:40 AM  Colton Bush  has presented today for surgery, with the diagnosis of h/o pancreatic cancer and new abd mass.  The various methods of treatment have been discussed with the patient and family. After consideration of risks, benefits and other options for treatment, the patient has consented to  Procedure(s): UPPER ENDOSCOPIC ULTRASOUND (EUS) RADIAL (N/A) ESOPHAGOGASTRODUODENOSCOPY (EGD) WITH PROPOFOL (N/A) as a surgical intervention.  The patient's history has been reviewed, patient examined, no change in status, stable for surgery.  I have reviewed the patient's chart and labs.  Questions were answered to the patient's satisfaction.     Milus Banister

## 2020-05-27 NOTE — Transfer of Care (Signed)
Immediate Anesthesia Transfer of Care Note  Patient: Colton Bush  Procedure(s) Performed: UPPER ENDOSCOPIC ULTRASOUND (EUS) RADIAL (N/A ) ESOPHAGOGASTRODUODENOSCOPY (EGD) WITH PROPOFOL (N/A )  Patient Location: PACU and Endoscopy Unit  Anesthesia Type:MAC  Level of Consciousness: drowsy  Airway & Oxygen Therapy: Patient Spontanous Breathing and Patient connected to face mask  Post-op Assessment: Report given to RN and Post -op Vital signs reviewed and stable  Post vital signs: Reviewed and stable  Last Vitals:  Vitals Value Taken Time  BP 116/65 05/27/20 1021  Temp    Pulse 61 05/27/20 1023  Resp 13 05/27/20 1023  SpO2 100 % 05/27/20 1023  Vitals shown include unvalidated device data.  Last Pain:  Vitals:   05/27/20 0903  TempSrc: Oral  PainSc: 0-No pain         Complications: No complications documented.

## 2020-05-27 NOTE — Op Note (Addendum)
[facility] Patient Name: Colton Bush Procedure Date: 05/27/2020 MRN: [id number] Attending MD: Milus Banister , MD Date of Birth: September 21, 1955 CSN: [id number] Age: 64 Admit Type: Outpatient Procedure:                Upper EUS Indications:              Pancreatic adenocarcinoma s/p 2018 [facility]                            surgery, now with rising CA 19-9 and PET avid soft                            tissue at porta hepatis Providers:                Milus Banister, MD, Cleda Daub, RN, Tyna Jaksch Technician Referring MD:             Truitt Merle, MD Medicines:                Monitored Anesthesia Care Complications:            No immediate complications. Estimated blood loss:                            None. Estimated Blood Loss:     Estimated blood loss: none. Procedure:                Pre-Anesthesia Assessment:                           - Prior to the procedure, a History and Physical                            was performed, and patient medications and                            allergies were reviewed. The patient's tolerance of                            previous anesthesia was also reviewed. The risks                            and benefits of the procedure and the sedation                            options and risks were discussed with the patient.                            All questions were answered, and informed consent                            was obtained. Prior Anticoagulants: The patient has  taken no previous anticoagulant or antiplatelet                            agents. ASA Grade Assessment: II - A patient with                            mild systemic disease. After reviewing the risks                            and benefits, the patient was deemed in                            satisfactory condition to undergo the procedure.                           After obtaining informed consent, the  endoscope was                            passed under direct vision. Throughout the                            procedure, the patient's blood pressure, pulse, and                            oxygen saturations were monitored continuously. The                            [id number] ([id number] Olympus Linear EUS was                            introduced through the mouth, and advanced to the                            jejunum. The upper EUS was accomplished without                            difficulty. The patient tolerated the procedure                            well. Scope In: Scope Out: Findings:      ENDOSCOPIC FINDING: :      Typical post Whipple UGI anatomy with normal appearing gastrojejunostomy       anastomosis      ENDOSONOGRAPHIC FINDING: :      I was unable to visualize the region of the porta hepatitis due to his       surgically altered anatomy. Impression:               - I was unable to visualize the region of the porta                            hepatitis (where the PET avid soft tissue was                            noted) due  to his surgically altered anatomy Moderate Sedation:      Not Applicable - Patient had care per Anesthesia. Recommendation:           - Discharge patient to home (ambulatory). Procedure Code(s):        --- Professional ---                           [phone], Esophagogastroduodenoscopy, flexible,                            transoral; with endoscopic ultrasound examination                            limited to the esophagus, stomach or duodenum, and                            adjacent structures Diagnosis Code(s):        --- Professional ---                           R93.5, Abnormal findings on diagnostic imaging of                            other abdominal regions, including retroperitoneum CPT copyright 2019 [employer]. All rights reserved. The codes documented in this report are preliminary and upon coder review may  be revised  to meet current compliance requirements. Milus Banister, MD 05/27/2020 10:20:57 AM This report has been signed electronically. Number of Addenda: 0

## 2020-05-27 NOTE — Anesthesia Postprocedure Evaluation (Signed)
Anesthesia Post Note  Patient: Colton Bush  Procedure(s) Performed: UPPER ENDOSCOPIC ULTRASOUND (EUS) RADIAL (N/A ) ESOPHAGOGASTRODUODENOSCOPY (EGD) WITH PROPOFOL (N/A )     Patient location during evaluation: PACU Anesthesia Type: MAC Level of consciousness: awake and alert Pain management: pain level controlled Vital Signs Assessment: post-procedure vital signs reviewed and stable Respiratory status: spontaneous breathing, nonlabored ventilation, respiratory function stable and patient connected to nasal cannula oxygen Cardiovascular status: stable and blood pressure returned to baseline Postop Assessment: no apparent nausea or vomiting Anesthetic complications: no   No complications documented.  Last Vitals:  Vitals:   05/27/20 1021 05/27/20 1030  BP: 116/65 103/62  Pulse: 70 68  Resp: 14 13  Temp:  (!) 35.6 C  SpO2: 97% 98%    Last Pain:  Vitals:   05/27/20 1030  TempSrc: Temporal  PainSc:                  Effie Berkshire

## 2020-05-27 NOTE — Discharge Instructions (Signed)
Upper Endoscopy, Adult, Care After  This sheet gives you information about how to care for yourself after your procedure. Your health care provider may also give you more specific instructions. If you have problems or questions, contact your health care provider.  What can I expect after the procedure?  After the procedure, it is common to have:  · A sore throat.  · Mild stomach pain or discomfort.  · Bloating.  · Nausea.  Follow these instructions at home:    · Follow instructions from your health care provider about what to eat or drink after your procedure.  · Return to your normal activities as told by your health care provider. Ask your health care provider what activities are safe for you.  · Take over-the-counter and prescription medicines only as told by your health care provider.  · Do not drive for 24 hours if you were given a sedative during your procedure.  · Keep all follow-up visits as told by your health care provider. This is important.  Contact a health care provider if you have:  · A sore throat that lasts longer than one day.  · Trouble swallowing.  Get help right away if:  · You vomit blood or your vomit looks like coffee grounds.  · You have:  ? A fever.  ? Bloody, black, or tarry stools.  ? A severe sore throat or you cannot swallow.  ? Difficulty breathing.  ? Severe pain in your chest or abdomen.  Summary  · After the procedure, it is common to have a sore throat, mild stomach discomfort, bloating, and nausea.  · Do not drive for 24 hours if you were given a sedative during the procedure.  · Follow instructions from your health care provider about what to eat or drink after your procedure.  · Return to your normal activities as told by your health care provider.  This information is not intended to replace advice given to you by your health care provider. Make sure you discuss any questions you have with your health care provider.  Document Revised: 01/08/2018 Document Reviewed:  12/17/2017  Elsevier Patient Education © 2020 [employer]

## 2020-05-27 NOTE — Anesthesia Preprocedure Evaluation (Addendum)
Anesthesia Evaluation  Patient identified by MRN, date of birth, ID band Patient awake    Reviewed: Allergy & Precautions, NPO status , Patient's Chart, lab work & pertinent test results  Airway Mallampati: I  TM Distance: >3 FB Neck ROM: Full    Dental  (+) Dental Advisory Given, Upper Dentures, Partial Lower   Pulmonary neg pulmonary ROS,    breath sounds clear to auscultation       Cardiovascular hypertension, Pt. on medications  Rhythm:Regular Rate:Normal     Neuro/Psych PSYCHIATRIC DISORDERS Depression    GI/Hepatic hiatal hernia, GERD  Medicated,  Endo/Other  diabetes, Type 2, Insulin Dependent  Renal/GU      Musculoskeletal   Abdominal Normal abdominal exam  (+)   Peds  Hematology   Anesthesia Other Findings   Reproductive/Obstetrics                            Anesthesia Physical Anesthesia Plan  ASA: II  Anesthesia Plan: MAC   Post-op Pain Management:    Induction: Intravenous  PONV Risk Score and Plan: 0 and Propofol infusion  Airway Management Planned: Natural Airway and Simple Face Mask  Additional Equipment: None  Intra-op Plan:   Post-operative Plan:   Informed Consent: I have reviewed the patients History and Physical, chart, labs and discussed the procedure including the risks, benefits and alternatives for the proposed anesthesia with the patient or authorized representative who has indicated his/her understanding and acceptance.       Plan Discussed with: CRNA  Anesthesia Plan Comments:        Anesthesia Quick Evaluation

## 2020-05-28 ENCOUNTER — Encounter (HOSPITAL_COMMUNITY): Payer: Self-pay | Admitting: Gastroenterology

## 2020-05-28 ENCOUNTER — Telehealth: Payer: Self-pay

## 2020-05-28 NOTE — Telephone Encounter (Signed)
I left a detailed vm for Mr Colton Bush.  I relayed Dr Ernestina Penna comments and recommendations.  I encouraged him to call with any questions.  Scheduling message sent.

## 2020-05-28 NOTE — Telephone Encounter (Signed)
-----   Message from Truitt Merle, MD sent at 05/27/2020 12:20 PM EDT ----- Thanks for trying, [name].  [name], please let pt know that I am aware of his procedure result. I suggest him to f/u with Dr. Mariah Milling in the next month to see if he has any suggestions (he is probably due for f/u soon). Schedule lab and f/u with me in 6 week.   Thanks   Krista Blue  ----- Message ----- From: Milus Banister, MD Sent: 05/27/2020  10:21 AM EDT To: Truitt Merle, MD  Krista Blue,  It was worth a try however I was unable to visualize the region of the porta hepatitis (where the PET avid soft tissue was noted) due to his surgically altered anatomy   Thanks  DJ

## 2020-05-31 ENCOUNTER — Telehealth: Payer: Self-pay | Admitting: Hematology

## 2020-05-31 NOTE — Telephone Encounter (Signed)
Scheduled appt per 10/29 sch msg   - patient is aware of appt date and time

## 2020-06-04 DIAGNOSIS — E119 Type 2 diabetes mellitus without complications: Secondary | ICD-10-CM | POA: Diagnosis not present

## 2020-06-04 DIAGNOSIS — Z980 Intestinal bypass and anastomosis status: Secondary | ICD-10-CM | POA: Diagnosis not present

## 2020-06-04 DIAGNOSIS — K219 Gastro-esophageal reflux disease without esophagitis: Secondary | ICD-10-CM | POA: Diagnosis not present

## 2020-06-04 DIAGNOSIS — R935 Abnormal findings on diagnostic imaging of other abdominal regions, including retroperitoneum: Secondary | ICD-10-CM | POA: Diagnosis not present

## 2020-06-04 DIAGNOSIS — Z85820 Personal history of malignant melanoma of skin: Secondary | ICD-10-CM | POA: Diagnosis not present

## 2020-06-04 DIAGNOSIS — Z981 Arthrodesis status: Secondary | ICD-10-CM | POA: Diagnosis not present

## 2020-06-04 DIAGNOSIS — I7789 Other specified disorders of arteries and arterioles: Secondary | ICD-10-CM | POA: Diagnosis not present

## 2020-06-04 DIAGNOSIS — Z794 Long term (current) use of insulin: Secondary | ICD-10-CM | POA: Diagnosis not present

## 2020-06-04 DIAGNOSIS — Z9049 Acquired absence of other specified parts of digestive tract: Secondary | ICD-10-CM | POA: Diagnosis not present

## 2020-06-04 DIAGNOSIS — K8681 Exocrine pancreatic insufficiency: Secondary | ICD-10-CM | POA: Diagnosis not present

## 2020-06-04 DIAGNOSIS — Z79899 Other long term (current) drug therapy: Secondary | ICD-10-CM | POA: Diagnosis not present

## 2020-06-04 DIAGNOSIS — I10 Essential (primary) hypertension: Secondary | ICD-10-CM | POA: Diagnosis not present

## 2020-06-04 DIAGNOSIS — Z90411 Acquired partial absence of pancreas: Secondary | ICD-10-CM | POA: Diagnosis not present

## 2020-06-04 DIAGNOSIS — C250 Malignant neoplasm of head of pancreas: Secondary | ICD-10-CM | POA: Diagnosis not present

## 2020-06-04 DIAGNOSIS — G709 Myoneural disorder, unspecified: Secondary | ICD-10-CM | POA: Diagnosis not present

## 2020-06-04 DIAGNOSIS — Z7982 Long term (current) use of aspirin: Secondary | ICD-10-CM | POA: Diagnosis not present

## 2020-06-04 DIAGNOSIS — Z934 Other artificial openings of gastrointestinal tract status: Secondary | ICD-10-CM | POA: Diagnosis not present

## 2020-06-04 DIAGNOSIS — R978 Other abnormal tumor markers: Secondary | ICD-10-CM | POA: Diagnosis not present

## 2020-06-04 DIAGNOSIS — E785 Hyperlipidemia, unspecified: Secondary | ICD-10-CM | POA: Diagnosis not present

## 2020-06-25 ENCOUNTER — Other Ambulatory Visit: Payer: Self-pay | Admitting: Cardiovascular Disease

## 2020-07-07 NOTE — Progress Notes (Signed)
[place]   Telephone:(336) [phone] Fax:(336) [phone]   Clinic Follow up Note   Patient Care Team: [name], MD as PCP - General  Date of Service:  07/09/2020  CHIEF COMPLAINT: Follow up pancreatic cancer  SUMMARY OF ONCOLOGIC HISTORY: Oncology History Overview Note  Cancer Staging Adenocarcinoma of head of pancreas [facility]) Staging form: Pancreas, AJCC 7th Edition - Clinical stage from 03/09/2016: Stage IIB (T2, N1, M0) - Signed by Truitt Merle, MD on 03/17/2016 - Pathologic stage from 11/17/2016: Stage IB (yT2, N0, cM0) - Signed by Truitt Merle, MD on 12/10/2016     Adenocarcinoma of head of pancreas ([place])  02/27/2016 Imaging   CT chest, abdomen and pelvis with contrast showed ill-defined heterogeneity of pancreatic head, highly suspicious for malignancy, ) quit about and biliary ductal dilatation, mildly prominent lymph nodes in the upper abdomen, largest 2 cm in the portocaval . No other metastasis.    03/09/2016 Initial Diagnosis   Adenocarcinoma of head of pancreas ([place])   03/09/2016 Procedure   Upper EUS showed a 3.5 cm irregular mass in the pancreatic head, causing pancreatic and biliary duct obstruction. The mass involves the portal vein 422 mm, strongly suggesting invasion. There is suspicious nearby adenopathy   03/09/2016 Initial Biopsy   Fine-needle aspiration of the pancreatic mass from EUS showed malignant cells consistent with adenocarcinoma.    03/28/2016 - 05/24/2016 Chemotherapy   neoadjuvant chemo with FOLFIRINOX every 2 weeks, for 5 cycles    06/28/2016 - 08/10/2016 Radiation Therapy   Neoadjuvant radiation by Dr. [name] Site/dose:   Pancreas treated to 45 Gy in 25 fractions. The Pancreas was then boosted to 54 Gy in 5 fractions.   06/28/2016 - 08/10/2016 Chemotherapy   Xeloda 2000 mg in the morning and 1500 mg in the evening, on the day of radiation.   09/04/2016 Imaging   CT Chest Abdomen Pelvis IMPRESSION: 1. Poorly defined pancreatic head  mass is grossly stable, with associated pancreatic ductal dilatation and portacaval adenopathy. No associated vascular encasement. 2. Common bile duct stent in place with stable biliary ductal dilatation. Left hepatic lobe atrophy. 3. Hepatomegaly.  Spleen is at the upper limits normal in size. 4. Aortic atherosclerosis (ICD10-170.0). Coronary artery calcification.   09/19/2016 Surgery   Diagnostic laparoscopy and possible whipple procedure by Dr. Barry Dienes. Whipple procedure aborted due to the pancreatic cancer involving SMV, SMA and portal veins.   09/19/2016 Pathology Results   Two liver lesions were noted during diagnostic laparoscopy and biopsy revealed benign hepatic parenchyma with assoicated fibrosis with no evidence of malignancy.   10/06/2016 - 10/20/2016 Chemotherapy   Gemcitabine and Abraxane weekly, on day 1, 8, and 15 every 28 days, starting on 10/06/2016, held after1 cycle due to pending surgery at [facility]   10/31/2016 Imaging   CT CAP w contrast at [facility] 10/31/16 Impression: 1.Interval decrease in the size of the ill-defined pancreatic head mass. There is short segment abutment of the portal vein/superior mesenteric vein but no evidence of venous distortion. 2.No evidence of metastatic disease in the chest, abdomen and pelvis.   11/17/2016 -  Hospital Admission   Patient presents to hospital for nausea and vomitting   11/17/2016 Surgery   pancreatecomy, promximal subtotal with total duodenectomy, partial  GASTRECTOMY, CHOLEDOCHOENTEROSTOMY AND GASTROJEJUNOSTOMY (WHIPPLE-TYPE PROCEDURE); WITH PANCREATOJEJUNOSTOMY   01/19/2017 - 03/02/2017 Chemotherapy   Gemcitabine monotherapy: 1094m/m2, d1,8 Q21d x2 cycles planned    05/18/2017 [facility]   [facility] Impression: 1.Status post Whipple with soft  tissue density just posterior to the SMA, which may be postsurgical, lymph node, or less likely recurrent disease. Recommend attention on follow-up 2.No evidence of  metastatic disease in the chest, abdomen, and pelvis.     09/20/2017 Imaging   CT CAP at [facility] 09/20/17 Impression: 1. Unchanged subcentimeter soft tissue nodule in the Whipple surgical bed, likely a small lymph node. 2. No evidence of metastatic disease in the chest, abdomen, or pelvis.   01/17/2018 Imaging   01/17/2018 CT CAP Impression: No evidence of recurrent or metastatic disease.   06/13/2018 Imaging   CT CAP W Contrast at [place] on 06/13/18 Impression:  1. Status post Whipple procedure with stable findings at the surgical bed. No convincing evidence of recurrent or metastatic disease within the chest, abdomen, or pelvis.  Electronically Reviewed by:  Gaye Pollack, MD, [facility] Electronically Reviewed on:  06/13/2018 12:15 PM  I have reviewed the images and concur with the above findings.   11/08/2018 Imaging   CT CAP W Contrast  IMPRESSION: 1. No findings of active malignancy. Prior Whipple procedure. Biliary stent in place. 2. Other imaging findings of potential clinical significance: Thoracic scoliosis. Aortic Atherosclerosis (ICD10-I70.0). Multilevel lumbar impingement.     02/06/2019 Imaging   CT CAP W Contrast IMPRESSION: 1. Status post Whipple procedure with no definitive findings to suggest metastatic disease in the chest, abdomen or pelvis. 2. Stable tiny pulmonary nodules in the right upper lobe compared to prior examinations, strongly favored to be benign. 3. Aortic atherosclerosis, in addition to left main and 2 vessel coronary artery disease. Please note that although the presence of coronary artery calcium documents the presence of coronary artery disease, the severity of this disease and any potential stenosis cannot be assessed on this non-gated CT examination. Assessment for potential risk factor modification, dietary therapy or pharmacologic therapy may be warranted, if clinically indicated. 4. Additional incidental findings, as above.    04/2019 Procedure   He had PAC removed in 04/2019   08/14/2019 Imaging   CT CAP [facility] 08/14/19 IMPRESSION: 1.  History of pancreatic adenocarcinoma status post Whipple with stable findings at the surgical bed. 2.  No evidence of metastatic disease in the chest, abdomen, or pelvis. 3.  Arterial phase hyperenhancing 9 mm lesion in subcapsular portion of hepatic segment 8, favor a flash filling hemangioma. Attention on follow-up.   09/04/2019 PET scan   IMPRESSION: 1. Post Whipple procedure without evidence of local pancreatic carcinoma recurrence. 2. No evidence metastatic disease in the liver or periportal lymph nodes. 3. No distant metastatic disease.   11/11/2019 Imaging   CT CAP Impression:   1. Stable appearance of arterially enhancing 9 mm lesion within segment 8  of the liver. Continued attention on follow-up.  2. No evidence of metastatic disease within the chest, abdomen, or pelvis.  3. Stable appearance of soft tissue density around the common hepatic  artery, for example series 8, image 44. This may represent post-surgical  changes. Continued attention on follow-up.    02/10/2020 Imaging   CT CAP  Impression:  Similar appearance of ill-defined soft tissue surrounding the common  hepatic artery, favored to be postsurgical. Attention on follow-up.   No definite evidence of metastatic disease.   Previously identified arterially enhancing 9 mm lesion in segment 8 of the  liver not well evaluated on today's study due to lack of arterial phase  imaging, possibly flash-filling hemangioma. Recommend attention on  follow-up.    04/14/2020 Tumor Marker   CA  19-9 at 1124   04/26/2020 Imaging   PET  IMPRESSION: Status post Whipple procedure.   Soft tissue fullness in the porta hepatis with associated mild hypermetabolism, worrisome for recurrence.   No evidence of distant metastases.   05/27/2020 Procedure   EUS by Dr Ardis Hughs  IMPRESSION - I was unable to visualize  the region of the porta hepatitis (where the PET avid soft tissue was noted) due to his surgically altered anatomy    06/04/2020 Pathology Results    Liver lesion biopsy  Fine Needle Aspiration by Dr Mariah Milling at [place] bland, hypocellular soft tissue fragments.    See Comment.    Comment:  The patient's prior surgical pathology specimen ([id number]) was reviewed. Evidence of adenocarcinoma is absent in this specimen.  Evaluation is limited by scant cellularity.   A repeat tissue sampling procedure may be helpful, if clinically indicated      CURRENT THERAPY:  Surveillance  INTERVAL HISTORY:  Colton Bush is here for a follow up of pancreatic cancer. He presents to the clinic alone. He was seen by Dr Mariah Milling at [facility] and was not able to obtain biopsy sample. He denies current pain and is able to eat adequately. He notes normal BM.     REVIEW OF SYSTEMS:   Constitutional: Denies fevers, chills or abnormal weight loss Eyes: Denies blurriness of vision Ears, nose, mouth, throat, and face: Denies mucositis or sore throat Respiratory: Denies cough, dyspnea or wheezes Cardiovascular: Denies palpitation, chest discomfort or lower extremity swelling Gastrointestinal:  Denies nausea, heartburn or change in bowel habits Skin: Denies abnormal skin rashes Lymphatics: Denies new lymphadenopathy or easy bruising Neurological:Denies numbness, tingling or new weaknesses Behavioral/Psych: Mood is stable, no new changes  All other systems were reviewed with the patient and are negative.  MEDICAL HISTORY:  Past Medical History:  Diagnosis Date  . Arthritis    left hand  . Bronchitis 1977  . Cancer ([place]) 03/09/2016   pancreatic cancer, sees Dr. Cristino Martes at [facility]   . Depression    takes Cymbalta daily  . Diabetes mellitus type II    sees Dr. Chalmers Cater   . GERD (gastroesophageal reflux disease)    takes Omeprazole daily  . H/O hiatal hernia   . Hyperlipidemia    takes Zocor  daily  . Hypertension    takes Amlodipine daily  . Hypoglycemia 06/18/2017  . Neck pain    C4-7 stenosis and herniated disc  . Neuromuscular disorder ([place])    hiatal hernia  . Scoliosis    slight  . Spinal cord injury, C5-C7 ([place])    c4-c7  . Stiffness of hand joint    d/t cervical issues    SURGICAL HISTORY: Past Surgical History:  Procedure Laterality Date  . ANTERIOR CERVICAL DECOMP/DISCECTOMY FUSION  08/18/2011   Procedure: ANTERIOR CERVICAL DECOMPRESSION/DISCECTOMY FUSION 3 LEVELS;  Surgeon: Winfield Cunas, MD;  Location: [place] [facility];  Service: Neurosurgery;  Laterality: N/A;  Anterior Cervical Four-Five/Five-Six/Six-Seven Decompression with Fusion, Plating, and Bonegraft  . CARPAL TUNNEL RELEASE  2013   bilateral, per Dr. Christella Noa   . COLONOSCOPY  10-30-14   per Dr. Olevia Perches, clear, repeat in 10 yrs   . egd with esophageal dilation  9-08   per Dr. Olevia Perches  . ERCP N/A 03/01/2016   Procedure: ENDOSCOPIC RETROGRADE CHOLANGIOPANCREATOGRAPHY (ERCP) with brushings and stent;  Surgeon: Doran Stabler, MD;  Location: [facility];  Service: Endoscopy;  Laterality: N/A;  . ESOPHAGOGASTRODUODENOSCOPY (EGD) WITH  PROPOFOL N/A 05/27/2020   Procedure: ESOPHAGOGASTRODUODENOSCOPY (EGD) WITH PROPOFOL;  Surgeon: Milus Banister, MD;  Location: [facility];  Service: Endoscopy;  Laterality: N/A;  . EUS N/A 03/09/2016   Procedure: ESOPHAGEAL ENDOSCOPIC ULTRASOUND (EUS) RADIAL;  Surgeon: Milus Banister, MD;  Location: [facility];  Service: Endoscopy;  Laterality: N/A;  . EUS N/A 05/27/2020   Procedure: UPPER ENDOSCOPIC ULTRASOUND (EUS) RADIAL;  Surgeon: Milus Banister, MD;  Location: [facility];  Service: Endoscopy;  Laterality: N/A;  . lymph nodes biopsy    . melanoma rt calf  1999  . PORT-A-CATH REMOVAL N/A 04/03/2019   Procedure: PORT REMOVAL;  Surgeon: Stark Klein, MD;  Location: [place];  Service: General;  Laterality: N/A;  . PORTACATH PLACEMENT Left 03/22/2016   Procedure: INSERTION  PORT-A-CATH;  Surgeon: Stark Klein, MD;  Location: [facility];  Service: General;  Laterality: Left;  . SPINE SURGERY    . TONSILLECTOMY     as a child  . ULNAR TUNNEL RELEASE  2013   right arm, per Dr. Christella Noa   . UPPER GASTROINTESTINAL ENDOSCOPY    . WHIPPLE PROCEDURE N/A 09/19/2016   Procedure: DIAGNOSTIC LAPAROSCOPY, LAPAROSCOPIC LIVER BIOPSY, RETROPERITONEAL EXPLORATION, INTRAOPERATIVE ULTRASOUND;  Surgeon: Stark Klein, MD;  Location: [place];  Service: General;  Laterality: N/A;    I have reviewed the social history and family history with the patient and they are unchanged from previous note.  ALLERGIES:  has No Known Allergies.  MEDICATIONS:  Current Outpatient Medications  Medication Sig Dispense Refill  . acetaminophen (TYLENOL) 500 MG tablet Take 1,000 mg by mouth every 6 (six) hours as needed for moderate pain or headache.    Marland Kitchen amLODipine (NORVASC) 10 MG tablet Take 1 tablet (10 mg total) by mouth daily. 90 tablet 3  . aspirin EC 81 MG tablet Take 81 mg by mouth daily. Swallow whole.    . benazepril (LOTENSIN) 10 MG tablet TAKE 1 TABLET DAILY (Patient taking differently: Take 10 mg by mouth daily. ) 90 tablet 0  . CREON 6000-19000 units CPEP Take 1 capsule by mouth with breakfast, with lunch, and with evening meal.     . diphenhydrAMINE HCl, Sleep, 25 MG TBDP Take 25 mg by mouth at bedtime as needed (sleep).    . ferrous sulfate 325 (65 FE) MG tablet Take 325 mg daily with breakfast by mouth.    Marland Kitchen FREESTYLE LITE test strip USE ONE STRIP TO CHECK GLUCOSE ONCE DAILY. PLEASE  SCHEDULE FOLLOW UP 50 each 0  . Insulin Detemir (LEVEMIR) 100 UNIT/ML Pen Inject 15 Units into the skin daily at 10 pm. (Patient taking differently: Inject 20-25 Units into the skin See admin instructions. Inject 25 units in the morning and 20 units at night) 90 mL 3  . Insulin Pen Needle 30G X 5 MM MISC Use one daily with insulin 100 each 2  . KLOR-CON M20 20 MEQ tablet TAKE 1 TABLET BY MOUTH EVERY DAY (Patient  not taking: Reported on 05/19/2020) 90 tablet 3  . Lancets (FREESTYLE) lancets USE AS INSTRUCTED TO CHECK BLOOD SUGAR ONCE A DAY 100 each 2  . loratadine (CLARITIN) 10 MG tablet Take 10 mg by mouth daily.    . Multiple Vitamin (MULTIVITAMIN WITH MINERALS) TABS tablet Take 1 tablet by mouth daily.    . naproxen sodium (ANAPROX) 220 MG tablet Take 440 mg by mouth 2 (two) times daily as needed (for pain.).    Marland Kitchen neomycin-polymyxin-hydrocortisone (CORTISPORIN) OTIC solution Apply 1-2 drops to toe  after soaking BID (Patient not taking: Reported on 05/19/2020) 10 mL 1  . NOVOLOG FLEXPEN 100 UNIT/ML FlexPen GIVE EVERY MORNING WITH BREAKFAST AND EVERY EVENING WITH SUPPER PER SLIDING SCALE (Patient taking differently: Inject 3-12 Units into the skin 3 (three) times daily with meals. ) 3 mL 0  . Omega-3 Fatty Acids (FISH OIL) 500 MG CAPS Take 500 mg by mouth daily.    Marland Kitchen omeprazole (PRILOSEC) 20 MG capsule TAKE 1 CAPSULE DAILY (Patient taking differently: Take 20 mg by mouth daily. ) 90 capsule 3  . rosuvastatin (CRESTOR) 20 MG tablet TAKE 1 TABLET BY MOUTH EVERY DAY **OFFICE VISIT NEEDED FOR FUTURE REFILLS** 30 tablet 0   No current facility-administered medications for this visit.   Facility-Administered Medications Ordered in Other Visits  Medication Dose Route Frequency Provider Last Rate Last Admin  . sodium chloride 0.9 % injection 10 mL  10 mL Intravenous PRN Truitt Merle, MD   10 mL at 02/09/17 1111    PHYSICAL EXAMINATION: ECOG PERFORMANCE STATUS: 0 - Asymptomatic  Vitals:   07/09/20 0841  BP: 132/81  Pulse: 73  Resp: 20  Temp: 98.1 F (36.7 C)  SpO2: 99%   Filed Weights   07/09/20 0841  Weight: 205 lb (93 kg)    Due to COVID19 we will limit examination to appearance. Patient had no complaints.  GENERAL:alert, no distress and comfortable SKIN: skin color normal, no rashes or significant lesions EYES: normal, Conjunctiva are pink and non-injected, sclera clear  NEURO: alert & oriented  x 3 with fluent speech   LABORATORY DATA:  I have reviewed the data as listed CBC Latest Ref Rng & Units 07/09/2020 04/14/2020 09/04/2019  WBC 4.0 - 10.5 K/uL 7.6 8.0 8.8  Hemoglobin 13.0 - 17.0 g/dL 14.8 14.5 15.3  Hematocrit 39.0 - 52.0 % 43.1 41.9 45.1  Platelets 150 - 400 K/uL 253 227 244     CMP Latest Ref Rng & Units 07/09/2020 04/14/2020 09/04/2019  Glucose 70 - 99 mg/dL 130(H) 73 174(H)  BUN 8 - 23 mg/dL _0 Creatinine 0.61 - 1.24 mg/dL 0.80 0.71 0.71  Sodium 135 - 145 mmol/L 139 141 139  Potassium 3.5 - 5.1 mmol/L 3.5 3.3(L) 4.3  Chloride 98 - 111 mmol/L 104 107 102  CO2 22 - 32 mmol/L _1 Calcium 8.9 - 10.3 mg/dL 9.3 8.8(L) 9.5  Total Protein 6.5 - 8.1 g/dL 7.6 7.1 8.0  Total Bilirubin 0.3 - 1.2 mg/dL 0.7 0.6 0.8  Alkaline Phos 38 - 126 U/L 425(H) 137(H) 116  AST 15 - 41 U/L 62(H) 36 29  ALT 0 - 44 U/L 106(H) 37 45(H)      RADIOGRAPHIC STUDIES: I have personally reviewed the radiological images as listed and agreed with the findings in the report. No results found.   ASSESSMENT & PLAN:  ASHBY MOSKAL is a 65 y.o. male with    1. Primary pancreatic adenocarcinoma, in pancreatic head, cT2N1M0, stage IIB, ypT2N0M0. Rising CA19.9 -He was diagnosed in 02/2016. He is s/p neoadjuvant FOLFIRINOX, concurrentchemoRT with Xeloda[facility].He is currently on surveillance. -His 01/2020 CT scan with [facility] showed stable soft tissue at the surgical bed, no other evidence of metastasis. -[facility], overall concerning for cancer recurrence. Ca 19-9 at 1125 on 04/15/11.  -His PET from 04/26/20 showed hypermetabolic uptake at the porta hepatis surgical site which is suspicious for local cancer recurrence. No evidence  of distant metastasis. He remains asymptomatic.  -Dr Ardis Hughs was unable to obtain a biopsy with EUS on 05/27/20. He was seen by Dr Mariah Milling at [facility] and underwent EUS biopsy on  06/04/20, path was negative. He is clinically asymptomatic. I discussed observing this for now. He is agreeable.  -Lab reviewed, he has developed transaminitis, total bilirubin normal, will follow up closely. -Will repeat MRI abdomen in 2 months or sooner if Tumor marker or LFTs worsen or he becomes symptomatic with jaundice or RUQ pain.  -F/u in 2 months or sooner if needed  -he is going to see Dr. Mariah Milling at [facility] next week    2. Transaminitis  -His LFTs have increased with AST 62, ALT 106, Alk Phos 425 today (07/09/20)  -Will monitor and repeat lab in 3-4 weeks.    3. Type 2 DM, HTN -He will continue medication and follow up with his primary care physician Dr. Sarajane Jews  4. Chronic Diarrhea  -Secondary to whipple surgery  -Well controlled/normal on Creon, will continue.Stable.  Plan -Lab the week of 1/3 and phone call the day after  -F/u in 2 months with lab and MRI abdomen w wo contrast a few days before, or sooner if his next lab LFTs get worse    No problem-specific Assessment & Plan notes found for this encounter.   Orders Placed This Encounter  Procedures  . MR Abdomen W Wo Contrast    Standing Status:   Future    Standing Expiration Date:   07/09/2021    Order Specific Question:   If indicated for the ordered procedure, I authorize the administration of contrast media per Radiology protocol    Answer:   Yes    Order Specific Question:   What is the patient's sedation requirement?    Answer:   No Sedation    Order Specific Question:   Does the patient have a pacemaker or implanted devices?    Answer:   No    Order Specific Question:   Preferred imaging location?    Answer:   [facility] (table limit - 550 lbs)   All questions were answered. The patient knows to call the clinic with any problems, questions or concerns. No barriers to learning was detected. The total time spent in the appointment was 30 minutes.     Truitt Merle, MD 07/09/2020   I, Joslyn Devon, am acting as scribe for Truitt Merle, MD.   I have reviewed the above documentation for accuracy and completeness, and I agree with the above.

## 2020-07-09 ENCOUNTER — Inpatient Hospital Stay: Payer: Medicare Other | Attending: Hematology | Admitting: Hematology

## 2020-07-09 ENCOUNTER — Other Ambulatory Visit: Payer: Self-pay

## 2020-07-09 ENCOUNTER — Encounter: Payer: Self-pay | Admitting: Hematology

## 2020-07-09 ENCOUNTER — Inpatient Hospital Stay: Payer: Medicare Other

## 2020-07-09 VITALS — BP 132/81 | HR 73 | Temp 98.1°F | Resp 20 | Ht 71.0 in | Wt 205.0 lb

## 2020-07-09 DIAGNOSIS — E119 Type 2 diabetes mellitus without complications: Secondary | ICD-10-CM | POA: Diagnosis not present

## 2020-07-09 DIAGNOSIS — I10 Essential (primary) hypertension: Secondary | ICD-10-CM | POA: Insufficient documentation

## 2020-07-09 DIAGNOSIS — C250 Malignant neoplasm of head of pancreas: Secondary | ICD-10-CM

## 2020-07-09 DIAGNOSIS — E785 Hyperlipidemia, unspecified: Secondary | ICD-10-CM | POA: Insufficient documentation

## 2020-07-09 DIAGNOSIS — R7401 Elevation of levels of liver transaminase levels: Secondary | ICD-10-CM | POA: Diagnosis not present

## 2020-07-09 DIAGNOSIS — Z9221 Personal history of antineoplastic chemotherapy: Secondary | ICD-10-CM | POA: Diagnosis not present

## 2020-07-09 DIAGNOSIS — Z7982 Long term (current) use of aspirin: Secondary | ICD-10-CM | POA: Diagnosis not present

## 2020-07-09 DIAGNOSIS — Z79899 Other long term (current) drug therapy: Secondary | ICD-10-CM | POA: Insufficient documentation

## 2020-07-09 DIAGNOSIS — Z923 Personal history of irradiation: Secondary | ICD-10-CM | POA: Insufficient documentation

## 2020-07-09 DIAGNOSIS — Z794 Long term (current) use of insulin: Secondary | ICD-10-CM | POA: Diagnosis not present

## 2020-07-09 DIAGNOSIS — K529 Noninfective gastroenteritis and colitis, unspecified: Secondary | ICD-10-CM | POA: Insufficient documentation

## 2020-07-09 DIAGNOSIS — M1990 Unspecified osteoarthritis, unspecified site: Secondary | ICD-10-CM | POA: Diagnosis not present

## 2020-07-09 LAB — COMPREHENSIVE METABOLIC PANEL
ALT: 106 U/L — ABNORMAL HIGH (ref 0–44)
AST: 62 U/L — ABNORMAL HIGH (ref 15–41)
Albumin: 3.6 g/dL (ref 3.5–5.0)
Alkaline Phosphatase: 425 U/L — ABNORMAL HIGH (ref 38–126)
Anion gap: 7 (ref 5–15)
BUN: 13 mg/dL (ref 8–23)
CO2: 28 mmol/L (ref 22–32)
Calcium: 9.3 mg/dL (ref 8.9–10.3)
Chloride: 104 mmol/L (ref 98–111)
Creatinine, Ser: 0.8 mg/dL (ref 0.61–1.24)
GFR, Estimated: >60 mL/min (ref >60)
Glucose, Bld: 130 mg/dL — ABNORMAL HIGH (ref 70–99)
Potassium: 3.5 mmol/L (ref 3.5–5.1)
Sodium: 139 mmol/L (ref 135–145)
Total Bilirubin: 0.7 mg/dL (ref 0.3–1.2)
Total Protein: 7.6 g/dL (ref 6.5–8.1)

## 2020-07-09 LAB — CBC WITH DIFFERENTIAL/PLATELET
Abs Immature Granulocytes: 0.03 10*3/uL (ref 0.00–0.07)
Basophils Absolute: 0.1 10*3/uL (ref 0.0–0.1)
Basophils Relative: 1 %
Eosinophils Absolute: 0.3 10*3/uL (ref 0.0–0.5)
Eosinophils Relative: 4 %
HCT: 43.1 % (ref 39.0–52.0)
Hemoglobin: 14.8 g/dL (ref 13.0–17.0)
Immature Granulocytes: 0 %
Lymphocytes Relative: 14 %
Lymphs Abs: 1 10*3/uL (ref 0.7–4.0)
MCH: 31.8 pg (ref 26.0–34.0)
MCHC: 34.3 g/dL (ref 30.0–36.0)
MCV: 92.7 fL (ref 80.0–100.0)
Monocytes Absolute: 0.8 10*3/uL (ref 0.1–1.0)
Monocytes Relative: 11 %
Neutro Abs: 5.4 10*3/uL (ref 1.7–7.7)
Neutrophils Relative %: 70 %
Platelets: 253 10*3/uL (ref 150–400)
RBC: 4.65 MIL/uL (ref 4.22–5.81)
RDW: 12.5 % (ref 11.5–15.5)
WBC: 7.6 10*3/uL (ref 4.0–10.5)
nRBC: 0 % (ref 0.0–0.2)

## 2020-07-10 LAB — CANCER ANTIGEN 19-9: CA 19-9: 1897 U/mL — ABNORMAL HIGH (ref 0–35)

## 2020-07-13 DIAGNOSIS — C250 Malignant neoplasm of head of pancreas: Secondary | ICD-10-CM | POA: Diagnosis not present

## 2020-07-17 ENCOUNTER — Other Ambulatory Visit: Payer: Self-pay | Admitting: Family Medicine

## 2020-07-22 ENCOUNTER — Encounter: Payer: Self-pay | Admitting: Family Medicine

## 2020-07-22 ENCOUNTER — Other Ambulatory Visit: Payer: Self-pay

## 2020-07-22 ENCOUNTER — Ambulatory Visit (INDEPENDENT_AMBULATORY_CARE_PROVIDER_SITE_OTHER): Payer: Medicare Other | Admitting: Family Medicine

## 2020-07-22 VITALS — BP 138/70 | HR 68 | Temp 98.3°F | Ht 71.0 in | Wt 201.0 lb

## 2020-07-22 DIAGNOSIS — E782 Mixed hyperlipidemia: Secondary | ICD-10-CM | POA: Diagnosis not present

## 2020-07-22 DIAGNOSIS — C250 Malignant neoplasm of head of pancreas: Secondary | ICD-10-CM

## 2020-07-22 DIAGNOSIS — I10 Essential (primary) hypertension: Secondary | ICD-10-CM | POA: Diagnosis not present

## 2020-07-22 MED ORDER — POTASSIUM CHLORIDE CRYS ER 20 MEQ PO TBCR: 20.0000 meq | EXTENDED_RELEASE_TABLET | Freq: Every day | ORAL | 3 refills | Status: DC

## 2020-07-22 MED ORDER — ROSUVASTATIN CALCIUM 20 MG PO TABS: 20.0000 mg | ORAL_TABLET | Freq: Every day | ORAL | 3 refills | Status: DC

## 2020-07-22 MED ORDER — BENAZEPRIL HCL 10 MG PO TABS
10.0000 mg | ORAL_TABLET | Freq: Every day | ORAL | 3 refills | Status: DC
Start: 2020-07-22 — End: 2020-10-04

## 2020-07-22 NOTE — Progress Notes (Signed)
   Subjective:    Patient ID: Colton Bush, male    DOB: [birth year], 64 y.o.   MRN: [id number]  HPI Here to follow up. He feels great and has no complaints. He has actually applied to work part time at [employer] so he can stay busy. He sees Oncology frequently, and they are concerned because his CA 19-9 marker has been steadily climbing over the past year. He sees Dr. Chalmers Cater for the diabetes. His BP is stable.   Review of Systems  Constitutional: Negative.   Respiratory: Negative.   Cardiovascular: Negative.   Gastrointestinal: Negative.        Objective:   Physical Exam Constitutional:      Appearance: Normal appearance.  Cardiovascular:     Rate and Rhythm: Normal rate and regular rhythm.     Pulses: Normal pulses.     Heart sounds: Normal heart sounds.  Pulmonary:     Effort: Pulmonary effort is normal.     Breath sounds: Normal breath sounds.  Neurological:     General: No focal deficit present.     Mental Status: He is alert and oriented to person, place, and time.           Assessment & Plan:  He is doing well. His diabetes and HTN are stable. He will follow up with Oncology. He will get fasting labs soon to check lipids.  Alysia Penna, MD

## 2020-07-26 ENCOUNTER — Other Ambulatory Visit: Payer: Self-pay

## 2020-07-26 ENCOUNTER — Other Ambulatory Visit: Payer: Medicare Other

## 2020-07-26 DIAGNOSIS — E782 Mixed hyperlipidemia: Secondary | ICD-10-CM

## 2020-07-26 LAB — HEPATIC FUNCTION PANEL
ALT: 91 U/L — ABNORMAL HIGH (ref 0–53)
AST: 72 U/L — ABNORMAL HIGH (ref 0–37)
Albumin: 4 g/dL (ref 3.5–5.2)
Alkaline Phosphatase: 512 U/L — ABNORMAL HIGH (ref 39–117)
Bilirubin, Direct: 0.2 mg/dL (ref 0.0–0.3)
Total Bilirubin: 0.7 mg/dL (ref 0.2–1.2)
Total Protein: 7.2 g/dL (ref 6.0–8.3)

## 2020-07-26 LAB — LIPID PANEL
Cholesterol: 83 mg/dL (ref 0–200)
HDL: 24.5 mg/dL — ABNORMAL LOW (ref >39.00)
LDL Cholesterol: 39 mg/dL (ref 0–99)
NonHDL: 58.06
Total CHOL/HDL Ratio: 3
Triglycerides: 94 mg/dL (ref 0.0–149.0)
VLDL: 18.8 mg/dL (ref 0.0–40.0)

## 2020-07-26 NOTE — Addendum Note (Signed)
Addended by: Leonette Nutting on: 07/26/2020 08:56 AM   Modules accepted: Orders

## 2020-08-02 NOTE — Progress Notes (Signed)
[facility]   Telephone:(336) [phone] Fax:(336) [phone]   Clinic Follow up Note   Patient Care Team: Nelwyn SalisburyFry, Stephen A, MD as PCP - General   I connected with Colton Bush on 08/05/2020 at  9:40 AM EST by telephone visit and verified that I am speaking with the correct person using two identifiers.  I discussed the limitations, risks, security and privacy concerns of performing an evaluation and management service by telephone and the availability of in person appointments. I also discussed with the patient that there may be a patient responsible charge related to this service. The patient expressed understanding and agreed to proceed.   Other persons participating in the visit and their role in the encounter:  None   Patient's location:  Home  Provider's location:  Office   CHIEF COMPLAINT: Follow up pancreatic cancer  SUMMARY OF ONCOLOGIC HISTORY: Oncology History Overview Note  Cancer Staging Adenocarcinoma of head of pancreas (HCC) Staging form: Pancreas, AJCC 7th Edition - Clinical stage from 03/09/2016: Stage IIB (T2, N1, M0) - Signed by Malachy MoodFeng, Sukanya Goldblatt, MD on 03/17/2016 - Pathologic stage from 11/17/2016: Stage IB (yT2, N0, cM0) - Signed by Malachy MoodFeng, Ersie Savino, MD on 12/10/2016     Adenocarcinoma of head of pancreas (HCC)  02/27/2016 Imaging   CT chest, abdomen and pelvis with contrast showed ill-defined heterogeneity of pancreatic head, highly suspicious for malignancy, ) quit about and biliary ductal dilatation, mildly prominent lymph nodes in the upper abdomen, largest 2 cm in the portocaval . No other metastasis.    03/09/2016 Initial Diagnosis   Adenocarcinoma of head of pancreas (HCC)   03/09/2016 Procedure   Upper EUS showed a 3.5 cm irregular mass in the pancreatic head, causing pancreatic and biliary duct obstruction. The mass involves the portal vein 422 mm, strongly suggesting invasion. There is suspicious nearby adenopathy   03/09/2016 Initial Biopsy   Fine-needle  aspiration of the pancreatic mass from EUS showed malignant cells consistent with adenocarcinoma.    03/28/2016 - 05/24/2016 Chemotherapy   neoadjuvant chemo with FOLFIRINOX every 2 weeks, for 5 cycles    06/28/2016 - 08/10/2016 Radiation Therapy   Neoadjuvant radiation by Dr. Mitzi HansenMoody Site/dose:   Pancreas treated to 45 Gy in 25 fractions. The Pancreas was then boosted to 54 Gy in 5 fractions.   06/28/2016 - 08/10/2016 Chemotherapy   Xeloda 2000 mg in the morning and 1500 mg in the evening, on the day of radiation.   09/04/2016 Imaging   CT Chest Abdomen Pelvis IMPRESSION: 1. Poorly defined pancreatic head mass is grossly stable, with associated pancreatic ductal dilatation and portacaval adenopathy. No associated vascular encasement. 2. Common bile duct stent in place with stable biliary ductal dilatation. Left hepatic lobe atrophy. 3. Hepatomegaly.  Spleen is at the upper limits normal in size. 4. Aortic atherosclerosis (ICD10-170.0). Coronary artery calcification.   09/19/2016 Surgery   Diagnostic laparoscopy and possible whipple procedure by Dr. Donell BeersByerly. Whipple procedure aborted due to the pancreatic cancer involving SMV, SMA and portal veins.   09/19/2016 Pathology Results   Two liver lesions were noted during diagnostic laparoscopy and biopsy revealed benign hepatic parenchyma with assoicated fibrosis with no evidence of malignancy.   10/06/2016 - 10/20/2016 Chemotherapy   Gemcitabine and Abraxane weekly, on day 1, 8, and 15 every 28 days, starting on 10/06/2016, held after1 cycle [facility]   10/31/2016 Imaging   [facility] 10/31/16 Impression: 1.Interval decrease in the size of the  ill-defined pancreatic head mass. There is short segment abutment of the portal vein/superior mesenteric vein but no evidence of venous distortion. 2.No evidence of metastatic disease in the chest, abdomen and pelvis.   11/17/2016 -  Hospital Admission   Patient  presents to hospital for nausea and vomitting   11/17/2016 Surgery   pancreatecomy, promximal subtotal with total duodenectomy, partial  GASTRECTOMY, CHOLEDOCHOENTEROSTOMY AND GASTROJEJUNOSTOMY (WHIPPLE-TYPE PROCEDURE); WITH PANCREATOJEJUNOSTOMY   01/19/2017 - 03/02/2017 Chemotherapy   Gemcitabine monotherapy: 1000mg /m2, d1,8 Q21d x2 cycles planned    05/18/2017 Imaging   CT CAP at [facility] 05/18/17 Impression: 1.Status post Whipple with soft tissue density just posterior to the SMA, which may be postsurgical, lymph node, or less likely recurrent disease. Recommend attention on follow-up 2.No evidence of metastatic disease in the chest, abdomen, and pelvis.     09/20/2017 Imaging   CT CAP [facility] 09/20/17 Impression: 1. Unchanged subcentimeter soft tissue nodule in the Whipple surgical bed, likely a small lymph node. 2. No evidence of metastatic disease in the chest, abdomen, or pelvis.   01/17/2018 Imaging   01/17/2018 CT CAP Impression: No evidence of recurrent or metastatic disease.   06/13/2018 Imaging   CT CAP W Contrast at [facility] on 06/13/18 Impression:  1. Status post Whipple procedure with stable findings at the surgical bed. No convincing evidence of recurrent or metastatic disease within the chest, abdomen, or pelvis.  Electronically Reviewed by:  06/15/18, MD, [facility] Electronically Reviewed on:  06/13/2018 12:15 PM  I have reviewed the images and concur with the above findings.   11/08/2018 Imaging   CT CAP W Contrast  IMPRESSION: 1. No findings of active malignancy. Prior Whipple procedure. Biliary stent in place. 2. Other imaging findings of potential clinical significance: Thoracic scoliosis. Aortic Atherosclerosis (ICD10-I70.0). Multilevel lumbar impingement.     02/06/2019 Imaging   CT CAP W Contrast IMPRESSION: 1. Status post Whipple procedure with no definitive findings to suggest metastatic disease in the chest, abdomen or pelvis. 2.  Stable tiny pulmonary nodules in the right upper lobe compared to prior examinations, strongly favored to be benign. 3. Aortic atherosclerosis, in addition to left main and 2 vessel coronary artery disease. Please note that although the presence of coronary artery calcium documents the presence of coronary artery disease, the severity of this disease and any potential stenosis cannot be assessed on this non-gated CT examination. Assessment for potential risk factor modification, dietary therapy or pharmacologic therapy may be warranted, if clinically indicated. 4. Additional incidental findings, as above.   04/2019 Procedure   He had PAC removed in 04/2019   08/14/2019 Imaging   CT CAP at [facility] 08/14/19 IMPRESSION: 1.  History of pancreatic adenocarcinoma status post Whipple with stable findings at the surgical bed. 2.  No evidence of metastatic disease in the chest, abdomen, or pelvis. 3.  Arterial phase hyperenhancing 9 mm lesion in subcapsular portion of hepatic segment 8, favor a flash filling hemangioma. Attention on follow-up.   09/04/2019 PET scan   IMPRESSION: 1. Post Whipple procedure without evidence of local pancreatic carcinoma recurrence. 2. No evidence metastatic disease in the liver or periportal lymph nodes. 3. No distant metastatic disease.   11/11/2019 Imaging   CT CAP Impression:   1. Stable appearance of arterially enhancing 9 mm lesion within segment 8  of the liver. Continued attention on follow-up.  2. No evidence of metastatic disease within the chest, abdomen, or pelvis.  3. Stable appearance of soft tissue density around the common  hepatic  artery, for example series 8, image 44. This may represent post-surgical  changes. Continued attention on follow-up.    02/10/2020 Imaging   CT CAP  Impression:  Similar appearance of ill-defined soft tissue surrounding the common  hepatic artery, favored to be postsurgical. Attention on follow-up.   No definite  evidence of metastatic disease.   Previously identified arterially enhancing 9 mm lesion in segment 8 of the  liver not well evaluated on today's study due to lack of arterial phase  imaging, possibly flash-filling hemangioma. Recommend attention on  follow-up.    04/14/2020 Tumor Marker   CA 19-9 at 1124   04/26/2020 Imaging   PET  IMPRESSION: Status post Whipple procedure.   Soft tissue fullness in the porta hepatis with associated mild hypermetabolism, worrisome for recurrence.   No evidence of distant metastases.   05/27/2020 Procedure   EUS by Dr Ardis Hughs  IMPRESSION - I was unable to visualize the region of the porta hepatitis (where the PET avid soft tissue was noted) due to his surgically altered anatomy    06/04/2020 Pathology Results    Liver lesion biopsy  Fine Needle Aspiration by Dr Mariah Milling at [place] [facility], hypocellular soft tissue fragments.    See Comment.    Comment:  The patient's prior surgical pathology specimen [id number]) was reviewed. Evidence of adenocarcinoma is absent in this specimen.  Evaluation is limited by scant cellularity.   A repeat tissue sampling procedure may be helpful, if clinically indicated      CURRENT THERAPY:  Surveillance  INTERVAL HISTORY:  Colton Bush is scheduled for a phone visit to discuss lab results from yesterday.  He is clinically doing very well, denies any pain, abdominal discomfort, nausea, or other symptoms.  His appetite and energy level are good, he has started part-time job at [employer] this week.  All other systems were reviewed with the patient and are negative.  MEDICAL HISTORY:  Past Medical History:  Diagnosis Date  . Arthritis    left hand  . Bronchitis 1977  . Cancer ([place]) 03/09/2016   pancreatic cancer, sees [facility]. Cristino Martes [facility]   . Depression    takes Cymbalta daily  . Diabetes mellitus type II    sees Dr. Chalmers Cater   . GERD (gastroesophageal reflux disease)    takes Omeprazole  daily  . H/O hiatal hernia   . Hyperlipidemia    takes Zocor daily  . Hypertension    takes Amlodipine daily  . Hypoglycemia 06/18/2017  . Neck pain    C4-7 stenosis and herniated disc  . Neuromuscular disorder ([place])    hiatal hernia  . Scoliosis    slight  . Spinal cord injury, C5-C7 ([place])    c4-c7  . Stiffness of hand joint    d/t cervical issues    SURGICAL HISTORY: Past Surgical History:  Procedure Laterality Date  . ANTERIOR CERVICAL DECOMP/DISCECTOMY FUSION  08/18/2011   Procedure: ANTERIOR CERVICAL DECOMPRESSION/DISCECTOMY FUSION 3 LEVELS;  Surgeon: Winfield Cunas, MD;  Location: [facility];  Service: Neurosurgery;  Laterality: N/A;  Anterior Cervical Four-Five/Five-Six/Six-Seven Decompression with Fusion, Plating, and Bonegraft  . CARPAL TUNNEL RELEASE  2013   bilateral, per Dr. Christella Noa   . COLONOSCOPY  10-30-14   per Dr. Olevia Perches, clear, repeat in 10 yrs   . egd with esophageal dilation  9-08   per Dr. Olevia Perches  . ERCP N/A 03/01/2016   Procedure: ENDOSCOPIC RETROGRADE CHOLANGIOPANCREATOGRAPHY (ERCP) with brushings and stent;  Surgeon: Doran Stabler, MD;  Location: [facility];  Service: Endoscopy;  Laterality: N/A;  . ESOPHAGOGASTRODUODENOSCOPY (EGD) WITH PROPOFOL N/A 05/27/2020   Procedure: ESOPHAGOGASTRODUODENOSCOPY (EGD) WITH PROPOFOL;  Surgeon: Milus Banister, MD;  Location: [facility];  Service: Endoscopy;  Laterality: N/A;  . EUS N/A 03/09/2016   Procedure: ESOPHAGEAL ENDOSCOPIC ULTRASOUND (EUS) RADIAL;  Surgeon: Milus Banister, MD;  Location: [facility];  Service: Endoscopy;  Laterality: N/A;  . EUS N/A 05/27/2020   Procedure: UPPER ENDOSCOPIC ULTRASOUND (EUS) RADIAL;  Surgeon: Milus Banister, MD;  Location: [facility];  Service: Endoscopy;  Laterality: N/A;  . lymph nodes biopsy    . melanoma rt calf  1999  . PORT-A-CATH REMOVAL N/A 04/03/2019   Procedure: PORT REMOVAL;  Surgeon: Stark Klein, MD;  Location: [facility];  Service: General;  Laterality: N/A;   . PORTACATH PLACEMENT Left 03/22/2016   Procedure: INSERTION PORT-A-CATH;  Surgeon: Stark Klein, MD;  Location: [facility];  Service: General;  Laterality: Left;  . SPINE SURGERY    . TONSILLECTOMY     as a child  . ULNAR TUNNEL RELEASE  2013   right arm, per Dr. Christella Noa   . UPPER GASTROINTESTINAL ENDOSCOPY    . WHIPPLE PROCEDURE N/A 09/19/2016   Procedure: DIAGNOSTIC LAPAROSCOPY, LAPAROSCOPIC LIVER BIOPSY, RETROPERITONEAL EXPLORATION, INTRAOPERATIVE ULTRASOUND;  Surgeon: Stark Klein, MD;  Location: [facility];  Service: General;  Laterality: N/A;    I have reviewed the social history and family history with the patient and they are unchanged from previous note.  ALLERGIES:  has No Known Allergies.  MEDICATIONS:  Current Outpatient Medications  Medication Sig Dispense Refill  . acetaminophen (TYLENOL) 500 MG tablet Take 1,000 mg by mouth every 6 (six) hours as needed for moderate pain or headache.    Marland Kitchen amLODipine (NORVASC) 10 MG tablet Take 1 tablet (10 mg total) by mouth daily. 90 tablet 3  . aspirin EC 81 MG tablet Take 81 mg by mouth daily. Swallow whole.    . benazepril (LOTENSIN) 10 MG tablet Take 1 tablet (10 mg total) by mouth daily. 90 tablet 3  . CREON 6000-19000 units CPEP Take 1 capsule by mouth with breakfast, with lunch, and with evening meal.     . diphenhydrAMINE HCl, Sleep, 25 MG TBDP Take 25 mg by mouth at bedtime as needed (sleep).    . ferrous sulfate 325 (65 FE) MG tablet Take 325 mg daily with breakfast by mouth.    Marland Kitchen FREESTYLE LITE test strip USE ONE STRIP TO CHECK GLUCOSE ONCE DAILY. PLEASE  SCHEDULE FOLLOW UP 50 each 0  . Insulin Detemir (LEVEMIR) 100 UNIT/ML Pen Inject 15 Units into the skin daily at 10 pm. (Patient taking differently: Inject 20-25 Units into the skin See admin instructions. Inject 25 units in the morning and 20 units at night) 90 mL 3  . Insulin Pen Needle 30G X 5 MM MISC Use one daily with insulin 100 each 2  . Lancets (FREESTYLE) lancets USE AS  INSTRUCTED TO CHECK BLOOD SUGAR ONCE A DAY 100 each 2  . loratadine (CLARITIN) 10 MG tablet Take 10 mg by mouth daily.    . Multiple Vitamin (MULTIVITAMIN WITH MINERALS) TABS tablet Take 1 tablet by mouth daily.    . naproxen sodium (ANAPROX) 220 MG tablet Take 440 mg by mouth 2 (two) times daily as needed (for pain.).    Marland Kitchen neomycin-polymyxin-hydrocortisone (CORTISPORIN) OTIC solution Apply 1-2 drops to toe after soaking BID 10 mL 1  .  NOVOLOG FLEXPEN 100 UNIT/ML FlexPen GIVE EVERY MORNING WITH BREAKFAST AND EVERY EVENING WITH SUPPER PER SLIDING SCALE (Patient taking differently: Inject 3-12 Units into the skin 3 (three) times daily with meals.) 3 mL 0  . Omega-3 Fatty Acids (FISH OIL) 500 MG CAPS Take 500 mg by mouth daily.    . omeprazole (PRILOSEC) 20 MG capsule TAKE 1 CAPSULE DAILY (Patient taking differently: Take 20 mg by mouth daily.) 90 cMarland Kitchenapsule 3  . potassium chloride SA (KLOR-CON M20) 20 MEQ tablet Take 1 tablet (20 mEq total) by mouth daily. 90 tablet 3  . rosuvastatin (CRESTOR) 20 MG tablet Take 1 tablet (20 mg total) by mouth daily. 90 tablet 3   No current facility-administered medications for this visit.   Facility-Administered Medications Ordered in Other Visits  Medication Dose Route Frequency Provider Last Rate Last Admin  . sodium chloride 0.9 % injection 10 mL  10 mL Intravenous PRN Malachy MoodFeng, Aylene Acoff, MD   10 mL at 02/09/17 1111    PHYSICAL EXAMINATION: ECOG PERFORMANCE STATUS: 0 - Asymptomatic  No vitals taken today, Exam not performed today   LABORATORY DATA:  I have reviewed the data as listed CBC Latest Ref Rng & Units 08/04/2020 07/09/2020 04/14/2020  WBC 4.0 - 10.5 K/uL 7.1 7.6 8.0  Hemoglobin 13.0 - 17.0 g/dL 16.115.3 09.614.8 04.514.5  Hematocrit 39.0 - 52.0 % 45.8 43.1 41.9  Platelets 150 - 400 K/uL 257 253 227     CMP Latest Ref Rng & Units 08/04/2020 07/26/2020 07/09/2020  Glucose 70 - 99 mg/dL 409(W140(H) - 119(J130(H)  BUN 8 - 23 mg/dL 10 - 13  Creatinine 4.780.61 - 1.24 mg/dL 2.950.83 -  6.210.80  Sodium 308135 - 145 mmol/L 141 - 139  Potassium 3.5 - 5.1 mmol/L 3.8 - 3.5  Chloride 98 - 111 mmol/L 105 - 104  CO2 22 - 32 mmol/L 28 - 28  Calcium 8.9 - 10.3 mg/dL 9.5 - 9.3  Total Protein 6.5 - 8.1 g/dL 8.3(H) 7.2 7.6  Total Bilirubin 0.3 - 1.2 mg/dL 0.9 0.7 0.7  Alkaline Phos 38 - 126 U/L 895(H) 512(H) 425(H)  AST 15 - 41 U/L 120(H) 72(H) 62(H)  ALT 0 - 44 U/L 145(H) 91(H) 106(H)      RADIOGRAPHIC STUDIES: I have personally reviewed the radiological images as listed and agreed with the findings in the report. No results found.   ASSESSMENT & PLAN:  Colton Bush is a 65 y.o. male with   1. Primary pancreatic adenocarcinoma, in pancreatic head, cT2N1M0, stage IIB, ypT2N0M0. Rising CA19.9 -He was diagnosed in 02/2016. He is s/p neoadjuvant FOLFIRINOX, concurrentchemoRT with Xeloda[facility].He is currently on surveillance. -His 01/2020 CT scan with [facility] showed stable soft tissue at the surgical bed, no other evidence of metastasis. -[facility], overall concerning for cancer recurrence. Ca 19-9 at 1125 on 04/14/20.  -His PET from 04/26/20 showedhypermetabolicuptake atthe porta hepatis surgical site which is suspicious for local cancer recurrence. No evidence of distant metastasis. He remains asymptomatic.  -Dr Christella HartiganJacobs was unable to obtain a biopsy with EUS on 05/27/20. [facility] and underwent EUS biopsy on 06/04/20, path was negative. He is clinically asymptomatic. I discussed observing this for now. He is agreeable.  -Repeated labs yesterday showed worsening transaminitis, AST and ALT 3-4 time of upper normal limites, total bilirubin still normal.  He is to 19.9 also continues to arise. I reviewed with  patient -Patient is scheduled for lab, CT, and MRI and follow-up with Dr. Mariah Milling on 08/31/2020. I sent a message to Dr. Mariah Milling to update his lab results, to see if he wants to move  his appointments sooner -I am certainly concerned for pancreatic cancer recurrence, but would not chemotherapy without imaging or tissue confirmed recurrence. -If his next scan is still negative, and his liver function continue worsening, I may recommend a random liver biopsy -f/u after his visit at [facility]    2. Transaminitis  -His LFTs have became abnormal since 06/2020, worse this week, he does not take Tylenol or drink alcohol. -he knows to watch for jaundice   3. Type 2 DM, HTN -He will continue medication and follow up with his primary care physician Dr. Sarajane Jews  4. Chronic Diarrhea  -Secondary to whipple surgery  -Well controlled/normal on Creon, will continue.Stable.  Plan  -Lab reviewed, worsening transaminitis -I sent a message to Dr. Olegario Shearer to see if he is able to move up his appointments (lab, CT/MRI and f/u)  -f/u after his visit at [facility]     No problem-specific [place] notes found for this encounter.   No orders of the defined types were placed in this encounter.  I discussed the assessment and treatment plan with the patient. The patient was provided an opportunity to ask questions and all were answered. The patient agreed with the plan and demonstrated an understanding of the instructions.  The patient was advised to call back or seek an in-person evaluation if the symptoms worsen or if the condition fails to improve as anticipated.  The total time spent in the appointment was 15 minutes.    Truitt Merle, MD 08/05/2020   I, Joslyn Devon, am acting as scribe for Truitt Merle, MD.   I have reviewed the above documentation for accuracy and completeness, and I agree with the above.

## 2020-08-04 ENCOUNTER — Inpatient Hospital Stay: Payer: Medicare Other | Attending: Hematology

## 2020-08-04 ENCOUNTER — Other Ambulatory Visit: Payer: Self-pay

## 2020-08-04 DIAGNOSIS — C250 Malignant neoplasm of head of pancreas: Secondary | ICD-10-CM

## 2020-08-04 LAB — COMPREHENSIVE METABOLIC PANEL
ALT: 145 U/L — ABNORMAL HIGH (ref 0–44)
AST: 120 U/L — ABNORMAL HIGH (ref 15–41)
Albumin: 3.9 g/dL (ref 3.5–5.0)
Alkaline Phosphatase: 895 U/L — ABNORMAL HIGH (ref 38–126)
Anion gap: 8 (ref 5–15)
BUN: 10 mg/dL (ref 8–23)
CO2: 28 mmol/L (ref 22–32)
Calcium: 9.5 mg/dL (ref 8.9–10.3)
Chloride: 105 mmol/L (ref 98–111)
Creatinine, Ser: 0.83 mg/dL (ref 0.61–1.24)
GFR, Estimated: >60 mL/min (ref >60)
Glucose, Bld: 140 mg/dL — ABNORMAL HIGH (ref 70–99)
Potassium: 3.8 mmol/L (ref 3.5–5.1)
Sodium: 141 mmol/L (ref 135–145)
Total Bilirubin: 0.9 mg/dL (ref 0.3–1.2)
Total Protein: 8.3 g/dL — ABNORMAL HIGH (ref 6.5–8.1)

## 2020-08-04 LAB — CBC WITH DIFFERENTIAL/PLATELET
Abs Immature Granulocytes: 0.02 10*3/uL (ref 0.00–0.07)
Basophils Absolute: 0.1 10*3/uL (ref 0.0–0.1)
Basophils Relative: 1 %
Eosinophils Absolute: 0.2 10*3/uL (ref 0.0–0.5)
Eosinophils Relative: 3 %
HCT: 45.8 % (ref 39.0–52.0)
Hemoglobin: 15.3 g/dL (ref 13.0–17.0)
Immature Granulocytes: 0 %
Lymphocytes Relative: 17 %
Lymphs Abs: 1.2 10*3/uL (ref 0.7–4.0)
MCH: 31.2 pg (ref 26.0–34.0)
MCHC: 33.4 g/dL (ref 30.0–36.0)
MCV: 93.5 fL (ref 80.0–100.0)
Monocytes Absolute: 0.7 10*3/uL (ref 0.1–1.0)
Monocytes Relative: 9 %
Neutro Abs: 5 10*3/uL (ref 1.7–7.7)
Neutrophils Relative %: 70 %
Platelets: 257 10*3/uL (ref 150–400)
RBC: 4.9 MIL/uL (ref 4.22–5.81)
RDW: 12.9 % (ref 11.5–15.5)
WBC: 7.1 10*3/uL (ref 4.0–10.5)
nRBC: 0 % (ref 0.0–0.2)

## 2020-08-05 ENCOUNTER — Inpatient Hospital Stay (HOSPITAL_BASED_OUTPATIENT_CLINIC_OR_DEPARTMENT_OTHER): Payer: Medicare Other | Admitting: Hematology

## 2020-08-05 ENCOUNTER — Encounter: Payer: Self-pay | Admitting: Hematology

## 2020-08-05 DIAGNOSIS — I10 Essential (primary) hypertension: Secondary | ICD-10-CM

## 2020-08-05 DIAGNOSIS — C250 Malignant neoplasm of head of pancreas: Secondary | ICD-10-CM | POA: Diagnosis not present

## 2020-08-05 LAB — CANCER ANTIGEN 19-9: CA 19-9: 2275 U/mL — ABNORMAL HIGH (ref 0–35)

## 2020-08-31 DIAGNOSIS — K769 Liver disease, unspecified: Secondary | ICD-10-CM | POA: Diagnosis not present

## 2020-08-31 DIAGNOSIS — I81 Portal vein thrombosis: Secondary | ICD-10-CM | POA: Diagnosis not present

## 2020-08-31 DIAGNOSIS — K838 Other specified diseases of biliary tract: Secondary | ICD-10-CM | POA: Diagnosis not present

## 2020-08-31 DIAGNOSIS — Z79899 Other long term (current) drug therapy: Secondary | ICD-10-CM | POA: Diagnosis not present

## 2020-08-31 DIAGNOSIS — C250 Malignant neoplasm of head of pancreas: Secondary | ICD-10-CM | POA: Diagnosis not present

## 2020-08-31 DIAGNOSIS — Z9049 Acquired absence of other specified parts of digestive tract: Secondary | ICD-10-CM | POA: Diagnosis not present

## 2020-08-31 DIAGNOSIS — I708 Atherosclerosis of other arteries: Secondary | ICD-10-CM | POA: Diagnosis not present

## 2020-09-01 ENCOUNTER — Telehealth (HOSPITAL_COMMUNITY): Payer: Self-pay

## 2020-09-06 NOTE — Progress Notes (Signed)
[place]   Telephone:(336) [phone] Fax:(336) [phone]   Clinic Follow up Note   Patient Care Team: [name], MD as PCP - General  Date of Service:  09/09/2020  CHIEF COMPLAINT: Follow up pancreatic cancer  SUMMARY OF ONCOLOGIC HISTORY: Oncology History Overview Note  Cancer Staging Adenocarcinoma of head of pancreas [facility]) Staging form: Pancreas, AJCC 7th Edition - Clinical stage from 03/09/2016: Stage IIB (T2, N1, M0) - Signed by Truitt Merle, MD on 03/17/2016 - Pathologic stage from 11/17/2016: Stage IB (yT2, N0, cM0) - Signed by Truitt Merle, MD on 12/10/2016     Adenocarcinoma of head of pancreas ([place])  02/27/2016 Imaging   CT chest, abdomen and pelvis with contrast showed ill-defined heterogeneity of pancreatic head, highly suspicious for malignancy, ) quit about and biliary ductal dilatation, mildly prominent lymph nodes in the upper abdomen, largest 2 cm in the portocaval . No other metastasis.    03/09/2016 Initial Diagnosis   Adenocarcinoma of head of pancreas ([place])   03/09/2016 Procedure   Upper EUS showed a 3.5 cm irregular mass in the pancreatic head, causing pancreatic and biliary duct obstruction. The mass involves the portal vein 422 mm, strongly suggesting invasion. There is suspicious nearby adenopathy   03/09/2016 Initial Biopsy   Fine-needle aspiration of the pancreatic mass from EUS showed malignant cells consistent with adenocarcinoma.    03/28/2016 - 05/24/2016 Chemotherapy   neoadjuvant chemo with FOLFIRINOX every 2 weeks, for 5 cycles    06/28/2016 - 08/10/2016 Radiation Therapy   Neoadjuvant radiation by Dr. [name] Site/dose:   Pancreas treated to 45 Gy in 25 fractions. The Pancreas was then boosted to 54 Gy in 5 fractions.   06/28/2016 - 08/10/2016 Chemotherapy   Xeloda 2000 mg in the morning and 1500 mg in the evening, on the day of radiation.   09/04/2016 Imaging   CT Chest Abdomen Pelvis IMPRESSION: 1. Poorly defined pancreatic head  mass is grossly stable, with associated pancreatic ductal dilatation and portacaval adenopathy. No associated vascular encasement. 2. Common bile duct stent in place with stable biliary ductal dilatation. Left hepatic lobe atrophy. 3. Hepatomegaly.  Spleen is at the upper limits normal in size. 4. Aortic atherosclerosis (ICD10-170.0). Coronary artery calcification.   09/19/2016 Surgery   Diagnostic laparoscopy and possible whipple procedure by Dr. Barry Dienes. Whipple procedure aborted due to the pancreatic cancer involving SMV, SMA and portal veins.   09/19/2016 Pathology Results   Two liver lesions were noted during diagnostic laparoscopy and biopsy revealed benign hepatic parenchyma with assoicated fibrosis with no evidence of malignancy.   10/06/2016 - 10/20/2016 Chemotherapy   Gemcitabine and Abraxane weekly, on day 1, 8, and 15 every 28 days, starting on 10/06/2016, held after1 cycle due to pending surgery at [facility]   10/31/2016 Imaging   CT CAP w contrast at [facility] 10/31/16 Impression: 1.Interval decrease in the size of the ill-defined pancreatic head mass. There is short segment abutment of the portal vein/superior mesenteric vein but no evidence of venous distortion. 2.No evidence of metastatic disease in the chest, abdomen and pelvis.   11/17/2016 -  Hospital Admission   Patient presents to hospital for nausea and vomitting   11/17/2016 Surgery   pancreatecomy, promximal subtotal with total duodenectomy, partial  GASTRECTOMY, CHOLEDOCHOENTEROSTOMY AND GASTROJEJUNOSTOMY (WHIPPLE-TYPE PROCEDURE); WITH PANCREATOJEJUNOSTOMY   01/19/2017 - 03/02/2017 Chemotherapy   Gemcitabine monotherapy: 1000mg /m2, d1,8 Q21d x2 cycles planned    05/18/2017 Imaging   CT CAP at [facility] 05/18/17 Impression: 1.Status post Whipple with soft  tissue density just posterior to the SMA, which may be postsurgical, lymph node, or less likely recurrent disease. Recommend attention on follow-up 2.No evidence of  metastatic disease in the chest, abdomen, and pelvis.     09/20/2017 Imaging   CT CAP at [facility] 09/20/17 Impression: 1. Unchanged subcentimeter soft tissue nodule in the Whipple surgical bed, likely a small lymph node. 2. No evidence of metastatic disease in the chest, abdomen, or pelvis.   01/17/2018 Imaging   01/17/2018 CT CAP Impression: No evidence of recurrent or metastatic disease.   06/13/2018 Imaging   CT CAP W Contrast at [place] on 06/13/18 Impression:  1. Status post Whipple procedure with stable findings at the surgical bed. No convincing evidence of recurrent or metastatic disease within the chest, abdomen, or pelvis.  Electronically Reviewed by:  Gaye Pollack, MD, [facility] Electronically Reviewed on:  06/13/2018 12:15 PM  I have reviewed the images and concur with the above findings.   11/08/2018 Imaging   CT CAP W Contrast  IMPRESSION: 1. No findings of active malignancy. Prior Whipple procedure. Biliary stent in place. 2. Other imaging findings of potential clinical significance: Thoracic scoliosis. Aortic Atherosclerosis (ICD10-I70.0). Multilevel lumbar impingement.     02/06/2019 Imaging   CT CAP W Contrast IMPRESSION: 1. Status post Whipple procedure with no definitive findings to suggest metastatic disease in the chest, abdomen or pelvis. 2. Stable tiny pulmonary nodules in the right upper lobe compared to prior examinations, strongly favored to be benign. 3. Aortic atherosclerosis, in addition to left main and 2 vessel coronary artery disease. Please note that although the presence of coronary artery calcium documents the presence of coronary artery disease, the severity of this disease and any potential stenosis cannot be assessed on this non-gated CT examination. Assessment for potential risk factor modification, dietary therapy or pharmacologic therapy may be warranted, if clinically indicated. 4. Additional incidental findings, as above.    04/2019 Procedure   He had PAC removed in 04/2019   08/14/2019 Imaging   CT CAP [facility] 08/14/19 IMPRESSION: 1.  History of pancreatic adenocarcinoma status post Whipple with stable findings at the surgical bed. 2.  No evidence of metastatic disease in the chest, abdomen, or pelvis. 3.  Arterial phase hyperenhancing 9 mm lesion in subcapsular portion of hepatic segment 8, favor a flash filling hemangioma. Attention on follow-up.   09/04/2019 PET scan   IMPRESSION: 1. Post Whipple procedure without evidence of local pancreatic carcinoma recurrence. 2. No evidence metastatic disease in the liver or periportal lymph nodes. 3. No distant metastatic disease.   11/11/2019 Imaging   CT CAP Impression:   1. Stable appearance of arterially enhancing 9 mm lesion within segment 8  of the liver. Continued attention on follow-up.  2. No evidence of metastatic disease within the chest, abdomen, or pelvis.  3. Stable appearance of soft tissue density around the common hepatic  artery, for example series 8, image 44. This may represent post-surgical  changes. Continued attention on follow-up.    02/10/2020 Imaging   CT CAP  Impression:  Similar appearance of ill-defined soft tissue surrounding the common  hepatic artery, favored to be postsurgical. Attention on follow-up.   No definite evidence of metastatic disease.   Previously identified arterially enhancing 9 mm lesion in segment 8 of the  liver not well evaluated on today's study due to lack of arterial phase  imaging, possibly flash-filling hemangioma. Recommend attention on  follow-up.    04/14/2020 Tumor Marker   CA  19-9 at 1124   04/26/2020 Imaging   PET  IMPRESSION: Status post Whipple procedure.   Soft tissue fullness in the porta hepatis with associated mild hypermetabolism, worrisome for recurrence.   No evidence of distant metastases.   05/27/2020 Procedure   EUS by Dr Ardis Hughs  IMPRESSION - I was unable to visualize  the region of the porta hepatitis (where the PET avid soft tissue was noted) due to his surgically altered anatomy    06/04/2020 Pathology Results    Liver lesion biopsy  Fine Needle Aspiration by Dr Mariah Milling at [facility], hypocellular soft tissue fragments.    See Comment.    Comment:  The patient's prior surgical pathology specimen [id number]) was reviewed. Evidence of adenocarcinoma is absent in this specimen.  Evaluation is limited by scant cellularity.   A repeat tissue sampling procedure may be helpful, if clinically indicated   08/31/2020 Imaging   CT CAP at [facility]  Impression:   1. Increased recurrent soft tissue along the celiac trunk and common and  proper hepatic arteries with unchanged associated occlusion of the  intrahepatic left portal vein and occlusion of the accessory left hepatic  artery. Increased associated narrowing of the common and proper hepatic  arteries.  2. Increased intrahepatic biliary duct dilation likely due to  aforementioned malignant soft tissue tracking into the liver hilum. Suggest  correlation with lab values for biliary obstruction.    08/31/2020 Imaging   MRI abdomen  Impression:   1. No parenchymal abnormality in the previously identified area of concern  in the left hepatic lobe.  2. Similar-appearing atrophy of the left hepatic lobe with mildly increased  intrahepatic bile duct dilation.  3. Multifocal areas of arterial enhancement in the liver. At least 2 of  these demonstrate uptake on the hepatobiliary phase. Findings could  represent a combination of focal perfusion anomalies and FNH.  4. Ill-defined soft tissue surrounding the celiac trunk is better assessed  on same day CT.       CURRENT THERAPY:  Surveillance  INTERVAL HISTORY:  Colton Bush is here for a follow up. He presents to the clinic with his wife.  He is clinically doing very well, nausea, or other new symptoms.  Good appetite and energy level.   Weight has been stable.  He had CT and MRI and saw Dr. Mariah Milling at [facility] on 08/31/2020.   All other systems were reviewed with the patient and are negative.  MEDICAL HISTORY:  Past Medical History:  Diagnosis Date  . Arthritis    left hand  . Bronchitis 1977  . Cancer ([place]) 03/09/2016   pancreatic cancer, sees Dr. Cristino Martes at [facility]   . Depression    takes Cymbalta daily  . Diabetes mellitus type II    sees Dr. Chalmers Cater   . GERD (gastroesophageal reflux disease)    takes Omeprazole daily  . H/O hiatal hernia   . Hyperlipidemia    takes Zocor daily  . Hypertension    takes Amlodipine daily  . Hypoglycemia 06/18/2017  . Neck pain    C4-7 stenosis and herniated disc  . Neuromuscular disorder ([place])    hiatal hernia  . Scoliosis    slight  . Spinal cord injury, C5-C7 ([place])    c4-c7  . Stiffness of hand joint    d/t cervical issues    SURGICAL HISTORY: Past Surgical History:  Procedure Laterality Date  . ANTERIOR CERVICAL DECOMP/DISCECTOMY FUSION  08/18/2011   Procedure: ANTERIOR  CERVICAL DECOMPRESSION/DISCECTOMY FUSION 3 LEVELS;  Surgeon: Winfield Cunas, MD;  Location: [place] NEURO ORS;  Service: Neurosurgery;  Laterality: N/A;  Anterior Cervical Four-Five/Five-Six/Six-Seven Decompression with Fusion, Plating, and Bonegraft  . CARPAL TUNNEL RELEASE  2013   bilateral, per Dr. Christella Noa   . COLONOSCOPY  10-30-14   per Dr. Olevia Perches, clear, repeat in 10 yrs   . egd with esophageal dilation  9-08   per Dr. Olevia Perches  . ERCP N/A 03/01/2016   Procedure: ENDOSCOPIC RETROGRADE CHOLANGIOPANCREATOGRAPHY (ERCP) with brushings and stent;  Surgeon: Doran Stabler, MD;  Location: [facility] ENDOSCOPY;  Service: Endoscopy;  Laterality: N/A;  . ESOPHAGOGASTRODUODENOSCOPY (EGD) WITH PROPOFOL N/A 05/27/2020   Procedure: ESOPHAGOGASTRODUODENOSCOPY (EGD) WITH PROPOFOL;  Surgeon: Milus Banister, MD;  Location: [facility] ENDOSCOPY;  Service: Endoscopy;  Laterality: N/A;  . EUS N/A 03/09/2016   Procedure: ESOPHAGEAL ENDOSCOPIC  ULTRASOUND (EUS) RADIAL;  Surgeon: Milus Banister, MD;  Location: [facility] ENDOSCOPY;  Service: Endoscopy;  Laterality: N/A;  . EUS N/A 05/27/2020   Procedure: UPPER ENDOSCOPIC ULTRASOUND (EUS) RADIAL;  Surgeon: Milus Banister, MD;  Location: [facility] ENDOSCOPY;  Service: Endoscopy;  Laterality: N/A;  . lymph nodes biopsy    . melanoma rt calf  1999  . PORT-A-CATH REMOVAL N/A 04/03/2019   Procedure: PORT REMOVAL;  Surgeon: Stark Klein, MD;  Location: [place];  Service: General;  Laterality: N/A;  . PORTACATH PLACEMENT Left 03/22/2016   Procedure: INSERTION PORT-A-CATH;  Surgeon: Stark Klein, MD;  Location: [facility]S;  Service: General;  Laterality: Left;  . SPINE SURGERY    . TONSILLECTOMY     as a child  . ULNAR TUNNEL RELEASE  2013   right arm, per Dr. Christella Noa   . UPPER GASTROINTESTINAL ENDOSCOPY    . WHIPPLE PROCEDURE N/A 09/19/2016   Procedure: DIAGNOSTIC LAPAROSCOPY, LAPAROSCOPIC LIVER BIOPSY, RETROPERITONEAL EXPLORATION, INTRAOPERATIVE ULTRASOUND;  Surgeon: [place] Klein, MD;  Location: [place];  Service: General;  Laterality: N/A;    I have reviewed the social history and family history with the patient and they are unchanged from previous note.  ALLERGIES:  has No Known Allergies.  MEDICATIONS:  Current Outpatient Medications  Medication Sig Dispense Refill  . acetaminophen (TYLENOL) 500 MG tablet Take 1,000 mg by mouth every 6 (six) hours as needed for moderate pain or headache.    Marland Kitchen amLODipine (NORVASC) 10 MG tablet Take 1 tablet (10 mg total) by mouth daily. 90 tablet 3  . aspirin EC 81 MG tablet Take 81 mg by mouth daily. Swallow whole.    . benazepril (LOTENSIN) 10 MG tablet Take 1 tablet (10 mg total) by mouth daily. 90 tablet 3  . CREON 6000-19000 units CPEP Take 1 capsule by mouth with breakfast, with lunch, and with evening meal.     . diphenhydrAMINE HCl, Sleep, 25 MG TBDP Take 25 mg by mouth at bedtime as needed (sleep).    . ferrous sulfate 325 (65 FE) MG tablet Take 325 mg daily  with breakfast by mouth.    Marland Kitchen FREESTYLE LITE test strip USE ONE STRIP TO CHECK GLUCOSE ONCE DAILY. PLEASE  SCHEDULE FOLLOW UP 50 each 0  . Insulin Detemir (LEVEMIR) 100 UNIT/ML Pen Inject 15 Units into the skin daily at 10 pm. (Patient taking differently: Inject 20-25 Units into the skin See admin instructions. Inject 25 units in the morning and 20 units at night) 90 mL 3  . Insulin Pen Needle 30G X 5 MM MISC Use one daily with insulin 100 each  2  . Lancets (FREESTYLE) lancets USE AS INSTRUCTED TO CHECK BLOOD SUGAR ONCE A DAY 100 each 2  . loratadine (CLARITIN) 10 MG tablet Take 10 mg by mouth daily.    . Multiple Vitamin (MULTIVITAMIN WITH MINERALS) TABS tablet Take 1 tablet by mouth daily.    . naproxen sodium (ANAPROX) 220 MG tablet Take 440 mg by mouth 2 (two) times daily as needed (for pain.).    Marland Kitchen neomycin-polymyxin-hydrocortisone (CORTISPORIN) OTIC solution Apply 1-2 drops to toe after soaking BID 10 mL 1  . NOVOLOG FLEXPEN 100 UNIT/ML FlexPen GIVE EVERY MORNING WITH BREAKFAST AND EVERY EVENING WITH SUPPER PER SLIDING SCALE (Patient taking differently: Inject 3-12 Units into the skin 3 (three) times daily with meals.) 3 mL 0  . Omega-3 Fatty Acids (FISH OIL) 500 MG CAPS Take 500 mg by mouth daily.    Marland Kitchen omeprazole (PRILOSEC) 20 MG capsule TAKE 1 CAPSULE DAILY (Patient taking differently: Take 20 mg by mouth daily.) 90 capsule 3  . potassium chloride SA (KLOR-CON M20) 20 MEQ tablet Take 1 tablet (20 mEq total) by mouth daily. 90 tablet 3  . rosuvastatin (CRESTOR) 20 MG tablet Take 1 tablet (20 mg total) by mouth daily. 90 tablet 3   No current facility-administered medications for this visit.   Facility-Administered Medications Ordered in Other Visits  Medication Dose Route Frequency Provider Last Rate Last Admin  . sodium chloride 0.9 % injection 10 mL  10 mL Intravenous PRN Truitt Merle, MD   10 mL at 02/09/17 1111    PHYSICAL EXAMINATION: ECOG PERFORMANCE STATUS: 0 -  Asymptomatic  Vitals:   09/09/20 1015  BP: (!) 154/83  Pulse: 77  Resp: 17  Temp: 98.5 F (36.9 C)  SpO2: 100%   Filed Weights   09/09/20 1015  Weight: 199 lb 6.4 oz (90.4 kg)    GENERAL:alert, no distress and comfortable SKIN: skin color, texture, turgor are normal, no rashes or significant lesions EYES: normal, Conjunctiva are pink and non-injected, sclera clear Musculoskeletal:no cyanosis of digits and no clubbing  NEURO: alert & oriented x 3 with fluent speech, no focal motor/sensory deficits  LABORATORY DATA:  I have reviewed the data as listed CBC Latest Ref Rng & Units 08/04/2020 07/09/2020 04/14/2020  WBC 4.0 - 10.5 K/uL 7.1 7.6 8.0  Hemoglobin 13.0 - 17.0 g/dL 15.3 14.8 14.5  Hematocrit 39.0 - 52.0 % 45.8 43.1 41.9  Platelets 150 - 400 K/uL 257 253 227     CMP Latest Ref Rng & Units 08/04/2020 07/26/2020 07/09/2020  Glucose 70 - 99 mg/dL 140(H) - 130(H)  BUN 8 - 23 mg/dL 10 - 13  Creatinine 0.61 - 1.24 mg/dL 0.83 - 0.80  Sodium 135 - 145 mmol/L 141 - 139  Potassium 3.5 - 5.1 mmol/L 3.8 - 3.5  Chloride 98 - 111 mmol/L 105 - 104  CO2 22 - 32 mmol/L 28 - 28  Calcium 8.9 - 10.3 mg/dL 9.5 - 9.3  Total Protein 6.5 - 8.1 g/dL 8.3(H) 7.2 7.6  Total Bilirubin 0.3 - 1.2 mg/dL 0.9 0.7 0.7  Alkaline Phos 38 - 126 U/L 895(H) 512(H) 425(H)  AST 15 - 41 U/L 120(H) 72(H) 62(H)  ALT 0 - 44 U/L 145(H) 91(H) 106(H)      RADIOGRAPHIC STUDIES: I have personally reviewed the radiological images as listed and agreed with the findings in the report. No results found.   ASSESSMENT & PLAN:  Colton Bush is a 64 y.o. male with   1. Primary  pancreatic adenocarcinoma, in pancreatic head, cT2N1M0, stage IIB, ypT2N0M0.Rising CA19.9 -He was diagnosed in 02/2016. He is s/p neoadjuvant FOLFIRINOX, concurrentchemoRT with Xeloda[facility].He is currently on surveillance. -His 01/2020 CT scan with [facility] showed stable soft tissue at the surgical  bed, no other evidence of metastasis. -[facility], overall concerning for cancer recurrence.Ca 19-9 at 1125 on 04/14/20. -HisPET from 04/26/20 showedhypermetabolicuptake atthe porta hepatis surgical site which is suspicious for local cancer recurrence. No evidence of distant metastasis. He remains asymptomatic. -Dr Ardis Hughs was unable to obtain a biopsy with EUS on 05/27/20.He was seen by Dr Mariah Milling at [facility] underwent EUS biopsyon 06/04/20, path was negative. I reviewed his recent CT and MRI at [facility] which showed increased soft tissue at surgical bed, no evidence of distant metastasis  -He is scheduled for IR biopsy at [facility] in 2 weeks  -I will f/u with him after the biopsy -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 if next biopsy confirms cancer recurrence, and he is not a candidate for surgical resection of radiation (he previously had radiation), then I will recommend starting chemo for disease control    2. Transaminitis  -His LFTs have became abnormal since 06/2020, worse this week, he does not take Tylenol or drink alcohol. -He knows to watch for jaundice   3. Type 2 DM, HTN -He will continue medication and follow up with his primary care physician Dr. Sarajane Jews  4. Chronic Diarrhea  -Secondary to whipple surgery  -Well controlled/normal on Creon, will continue.Stable.  Plan  -scans at [facility] reviewed -He is scheduled for biopsy at [facility] in 2 weeks -Phone visit in 3 weeks     No problem-specific Assessment & Plan notes found for this encounter.   No orders of the defined types were placed in this encounter.  All questions were answered. The patient knows to call the clinic with any problems, questions or concerns. No barriers to learning was detected. The total time spent in the appointment was 30 minutes.     Truitt Merle, MD 09/09/2020   I, Joslyn Devon, am acting as scribe for Truitt Merle, MD.   I have reviewed the above documentation for accuracy and  completeness, and I agree with the above.

## 2020-09-07 ENCOUNTER — Other Ambulatory Visit: Payer: Medicare Other

## 2020-09-09 ENCOUNTER — Inpatient Hospital Stay: Payer: Medicare Other | Attending: Hematology | Admitting: Hematology

## 2020-09-09 ENCOUNTER — Telehealth: Payer: Self-pay | Admitting: Hematology

## 2020-09-09 ENCOUNTER — Encounter: Payer: Self-pay | Admitting: Hematology

## 2020-09-09 ENCOUNTER — Other Ambulatory Visit: Payer: Self-pay

## 2020-09-09 VITALS — BP 154/83 | HR 77 | Temp 98.5°F | Resp 17 | Ht 71.0 in | Wt 199.4 lb

## 2020-09-09 DIAGNOSIS — E119 Type 2 diabetes mellitus without complications: Secondary | ICD-10-CM | POA: Diagnosis not present

## 2020-09-09 DIAGNOSIS — C250 Malignant neoplasm of head of pancreas: Secondary | ICD-10-CM | POA: Diagnosis not present

## 2020-09-09 DIAGNOSIS — Z794 Long term (current) use of insulin: Secondary | ICD-10-CM | POA: Diagnosis not present

## 2020-09-09 DIAGNOSIS — K529 Noninfective gastroenteritis and colitis, unspecified: Secondary | ICD-10-CM | POA: Diagnosis not present

## 2020-09-09 DIAGNOSIS — Z79899 Other long term (current) drug therapy: Secondary | ICD-10-CM | POA: Diagnosis not present

## 2020-09-09 DIAGNOSIS — Z85820 Personal history of malignant melanoma of skin: Secondary | ICD-10-CM | POA: Insufficient documentation

## 2020-09-09 DIAGNOSIS — Z90411 Acquired partial absence of pancreas: Secondary | ICD-10-CM | POA: Insufficient documentation

## 2020-09-09 DIAGNOSIS — Z903 Acquired absence of stomach [part of]: Secondary | ICD-10-CM | POA: Diagnosis not present

## 2020-09-09 DIAGNOSIS — I10 Essential (primary) hypertension: Secondary | ICD-10-CM | POA: Diagnosis not present

## 2020-09-09 DIAGNOSIS — Z9221 Personal history of antineoplastic chemotherapy: Secondary | ICD-10-CM | POA: Diagnosis not present

## 2020-09-09 DIAGNOSIS — Z923 Personal history of irradiation: Secondary | ICD-10-CM | POA: Insufficient documentation

## 2020-09-09 DIAGNOSIS — R7401 Elevation of levels of liver transaminase levels: Secondary | ICD-10-CM | POA: Insufficient documentation

## 2020-09-09 DIAGNOSIS — Z7982 Long term (current) use of aspirin: Secondary | ICD-10-CM | POA: Diagnosis not present

## 2020-09-09 NOTE — Telephone Encounter (Signed)
Left message with follow-up appointment per 2/10 los. Gave option to call back to reschedule if needed.

## 2020-09-10 ENCOUNTER — Ambulatory Visit (HOSPITAL_COMMUNITY): Payer: Medicare Other

## 2020-09-13 ENCOUNTER — Other Ambulatory Visit (HOSPITAL_COMMUNITY): Payer: Medicare Other

## 2020-09-13 DIAGNOSIS — E109 Type 1 diabetes mellitus without complications: Secondary | ICD-10-CM | POA: Diagnosis not present

## 2020-09-13 DIAGNOSIS — I10 Essential (primary) hypertension: Secondary | ICD-10-CM | POA: Diagnosis not present

## 2020-09-24 DIAGNOSIS — C250 Malignant neoplasm of head of pancreas: Secondary | ICD-10-CM | POA: Diagnosis not present

## 2020-09-24 DIAGNOSIS — M7989 Other specified soft tissue disorders: Secondary | ICD-10-CM | POA: Diagnosis not present

## 2020-09-24 DIAGNOSIS — C786 Secondary malignant neoplasm of retroperitoneum and peritoneum: Secondary | ICD-10-CM | POA: Diagnosis not present

## 2020-09-29 NOTE — Progress Notes (Signed)
[place]   Telephone:([phone]) [phone] Fax:(336) [phone]   Clinic Follow up Note   Patient Care Team: [name], MD as PCP - General   I connected with Colton Bush on 09/30/2020 at 12:45 PM EST by telephone visit and verified that I am speaking with the correct person using two identifiers.  I discussed the limitations, risks, security and privacy concerns of performing an evaluation and management service by telephone and the availability of in person appointments. I also discussed with the patient that there may be a patient responsible charge related to this service. The patient expressed understanding and agreed to proceed.   Other persons participating in the visit and their role in the encounter:  None   Patient's location:  Home  Provider's location:  Office   CHIEF COMPLAINT: Follow up pancreatic cancer  SUMMARY OF ONCOLOGIC HISTORY: Oncology History Overview Note  Cancer Staging Adenocarcinoma of head of pancreas ([place]) Staging form: Pancreas, AJCC 7th Edition - Clinical stage from 03/09/2016: Stage IIB (T2, N1, M0) - Signed by Truitt Merle, MD on 03/17/2016 - Pathologic stage from 11/17/2016: Stage IB (yT2, N0, cM0) - Signed by Truitt Merle, MD on 12/10/2016     Adenocarcinoma of head of pancreas ([place])  02/27/2016 Imaging   CT chest, abdomen and pelvis with contrast showed ill-defined heterogeneity of pancreatic head, highly suspicious for malignancy, ) quit about and biliary ductal dilatation, mildly prominent lymph nodes in the upper abdomen, largest 2 cm in the portocaval . No other metastasis.    03/09/2016 Initial Diagnosis   Adenocarcinoma of head of pancreas ([place])   03/09/2016 Procedure   Upper EUS showed a 3.5 cm irregular mass in the pancreatic head, causing pancreatic and biliary duct obstruction. The mass involves the portal vein 422 mm, strongly suggesting invasion. There is suspicious nearby adenopathy   03/09/2016 Initial Biopsy   Fine-needle  aspiration of the pancreatic mass from EUS showed malignant cells consistent with adenocarcinoma.    03/28/2016 - 05/24/2016 Chemotherapy   neoadjuvant chemo with FOLFIRINOX every 2 weeks, for 5 cycles    06/28/2016 - 08/10/2016 Radiation Therapy   Neoadjuvant radiation by Dr. [name] Site/dose:   Pancreas treated to 45 Gy in 25 fractions. The Pancreas was then boosted to 54 Gy in 5 fractions.   06/28/2016 - 08/10/2016 Chemotherapy   Xeloda 2000 mg in the morning and 1500 mg in the evening, on the day of radiation.   09/04/2016 Imaging   CT Chest Abdomen Pelvis IMPRESSION: 1. Poorly defined pancreatic head mass is grossly stable, with associated pancreatic ductal dilatation and portacaval adenopathy. No associated vascular encasement. 2. Common bile duct stent in place with stable biliary ductal dilatation. Left hepatic lobe atrophy. 3. Hepatomegaly.  Spleen is at the upper limits normal in size. 4. Aortic atherosclerosis (ICD10-170.0). Coronary artery calcification.   09/19/2016 Surgery   Diagnostic laparoscopy and possible whipple procedure by Dr. Barry Dienes. Whipple procedure aborted due to the pancreatic cancer involving SMV, SMA and portal veins.   09/19/2016 Pathology Results   Two liver lesions were noted during diagnostic laparoscopy and biopsy revealed benign hepatic parenchyma with assoicated fibrosis with no evidence of malignancy.   10/06/2016 - 10/20/2016 Chemotherapy   Gemcitabine and Abraxane weekly, on day 1, 8, and 15 every 28 days, starting on 10/06/2016, held after1 cycle due to pending surgery at [facility]   10/31/2016 Imaging   CT CAP w contrast at [facility] 10/31/16 Impression: 1.Interval decrease in the size of the ill-defined  pancreatic head mass. There is short segment abutment of the portal vein/superior mesenteric vein but no evidence of venous distortion. 2.No evidence of metastatic disease in the chest, abdomen and pelvis.   11/17/2016 -  Hospital Admission   Patient  presents to hospital for nausea and vomitting   11/17/2016 Surgery   pancreatecomy, promximal subtotal with total duodenectomy, partial  GASTRECTOMY, CHOLEDOCHOENTEROSTOMY AND GASTROJEJUNOSTOMY (WHIPPLE-TYPE PROCEDURE); WITH PANCREATOJEJUNOSTOMY   01/19/2017 - 03/02/2017 Chemotherapy   Gemcitabine monotherapy: 1000mg /m2, d1,8 Q21d x2 cycles planned    05/18/2017 Imaging   CT CAP at [facility] 05/18/17 Impression: 1.Status post Whipple with soft tissue density just posterior to the SMA, which may be postsurgical, lymph node, or less likely recurrent disease. Recommend attention on follow-up 2.No evidence of metastatic disease in the chest, abdomen, and pelvis.     09/20/2017 Imaging   CT CAP at [facility] 09/20/17 Impression: 1. Unchanged subcentimeter soft tissue nodule in the Whipple surgical bed, likely a small lymph node. 2. No evidence of metastatic disease in the chest, abdomen, or pelvis.   01/17/2018 Imaging   01/17/2018 CT CAP Impression: No evidence of recurrent or metastatic disease.   06/13/2018 Imaging   CT CAP W Contrast at [place] on 06/13/18 Impression:  1. Status post Whipple procedure with stable findings at the surgical bed. No convincing evidence of recurrent or metastatic disease within the chest, abdomen, or pelvis.  Electronically Reviewed by:  Gaye Pollack, MD, [facility] Electronically Reviewed on:  06/13/2018 12:15 PM  I have reviewed the images and concur with the above findings.   11/08/2018 Imaging   CT CAP W Contrast  IMPRESSION: 1. No findings of active malignancy. Prior Whipple procedure. Biliary stent in place. 2. Other imaging findings of potential clinical significance: Thoracic scoliosis. Aortic Atherosclerosis (ICD10-I70.0). Multilevel lumbar impingement.     02/06/2019 Imaging   CT CAP W Contrast IMPRESSION: 1. Status post Whipple procedure with no definitive findings to suggest metastatic disease in the chest, abdomen or pelvis. 2.  Stable tiny pulmonary nodules in the right upper lobe compared to prior examinations, strongly favored to be benign. 3. Aortic atherosclerosis, in addition to left main and 2 vessel coronary artery disease. Please note that although the presence of coronary artery calcium documents the presence of coronary artery disease, the severity of this disease and any potential stenosis cannot be assessed on this non-gated CT examination. Assessment for potential risk factor modification, dietary therapy or pharmacologic therapy may be warranted, if clinically indicated. 4. Additional incidental findings, as above.   04/2019 Procedure   He had PAC removed in 04/2019   08/14/2019 Imaging   CT CAP at [facility] 08/14/19 IMPRESSION: 1.  History of pancreatic adenocarcinoma status post Whipple with stable findings at the surgical bed. 2.  No evidence of metastatic disease in the chest, abdomen, or pelvis. 3.  Arterial phase hyperenhancing 9 mm lesion in subcapsular portion of hepatic segment 8, favor a flash filling hemangioma. Attention on follow-up.   09/04/2019 PET scan   IMPRESSION: 1. Post Whipple procedure without evidence of local pancreatic carcinoma recurrence. 2. No evidence metastatic disease in the liver or periportal lymph nodes. 3. No distant metastatic disease.   11/11/2019 Imaging   CT CAP Impression:   1. Stable appearance of arterially enhancing 9 mm lesion within segment 8  of the liver. Continued attention on follow-up.  2. No evidence of metastatic disease within the chest, abdomen, or pelvis.  3. Stable appearance of soft tissue density around the common hepatic  artery, for example series 8, image 44. This may represent post-surgical  changes. Continued attention on follow-up.    02/10/2020 Imaging   CT CAP  Impression:  Similar appearance of ill-defined soft tissue surrounding the common  hepatic artery, favored to be postsurgical. Attention on follow-up.   No definite  evidence of metastatic disease.   Previously identified arterially enhancing 9 mm lesion in segment 8 of the  liver not well evaluated on today's study due to lack of arterial phase  imaging, possibly flash-filling hemangioma. Recommend attention on  follow-up.    04/14/2020 Tumor Marker   CA 19-9 at 1124   04/26/2020 Imaging   PET  IMPRESSION: Status post Whipple procedure.   Soft tissue fullness in the porta hepatis with associated mild hypermetabolism, worrisome for recurrence.   No evidence of distant metastases.   05/27/2020 Procedure   EUS by Dr Ardis Hughs  IMPRESSION - I was unable to visualize the region of the porta hepatitis (where the PET avid soft tissue was noted) due to his surgically altered anatomy    06/04/2020 Pathology Results    Liver lesion biopsy  Fine Needle Aspiration by Dr Mariah Milling at [facility], hypocellular soft tissue fragments.    See Comment.    Comment:  The patient's prior surgical pathology specimen ([id number]) was reviewed. Evidence of adenocarcinoma is absent in this specimen.  Evaluation is limited by scant cellularity.   A repeat tissue sampling procedure may be helpful, if clinically indicated   08/31/2020 Imaging   CT CAP at [facility]  Impression:   1. Increased recurrent soft tissue along the celiac trunk and common and  proper hepatic arteries with unchanged associated occlusion of the  intrahepatic left portal vein and occlusion of the accessory left hepatic  artery. Increased associated narrowing of the common and proper hepatic  arteries.  2. Increased intrahepatic biliary duct dilation likely due to  aforementioned malignant soft tissue tracking into the liver hilum. Suggest  correlation with lab values for biliary obstruction.    08/31/2020 Imaging   MRI abdomen  Impression:   1. No parenchymal abnormality in the previously identified area of concern  in the left hepatic lobe.  2. Similar-appearing atrophy of the  left hepatic lobe with mildly increased  intrahepatic bile duct dilation.  3. Multifocal areas of arterial enhancement in the liver. At least 2 of  these demonstrate uptake on the hepatobiliary phase. Findings could  represent a combination of focal perfusion anomalies and FNH.  4. Ill-defined soft tissue surrounding the celiac trunk is better assessed  on same day CT.    08/31/2020 Imaging   MRI at [facility]  Impression:   1. No parenchymal abnormality in the previously identified area of concern  in the left hepatic lobe.  2. Similar-appearing atrophy of the left hepatic lobe with mildly increased  intrahepatic bile duct dilation.  3. Multifocal areas of arterial enhancement in the liver. At least 2 of  these demonstrate uptake on the hepatobiliary phase. Findings could  represent a combination of focal perfusion anomalies and FNH.  4. Ill-defined soft tissue surrounding the celiac trunk is better assessed  on same day CT.     08/31/2020 Imaging   CT CAP at [facility]  Impression:   1. Increased recurrent soft tissue along the celiac trunk and common and  proper hepatic arteries with unchanged associated occlusion of the  intrahepatic left portal vein and occlusion of the accessory left hepatic  artery. Increased associated narrowing  of the common and proper hepatic  arteries.  2. Increased intrahepatic biliary duct dilation likely due to  aforementioned malignant soft tissue tracking into the liver hilum. Suggest  correlation with lab values for biliary obstruction.       CURRENT THERAPY:  Surveillance  INTERVAL HISTORY:  Colton Bush is scheduled for a phone visit to follow-up his recent biopsy at [facility].  He tolerated the procedure well, no complications.  He continues to do well clinically, denies any pain, abdominal discomfort, nausea, or other new symptoms.  He has good appetite and energy level, weight is stable.  All other systems were reviewed with the patient and are  negative.  MEDICAL HISTORY:  Past Medical History:  Diagnosis Date  . Arthritis    left hand  . Bronchitis 1977  . Cancer ([place]) 03/09/2016   pancreatic cancer, sees Dr. Cristino Martes at [facility]   . Depression    takes Cymbalta daily  . Diabetes mellitus type II    sees Dr. Chalmers Cater   . GERD (gastroesophageal reflux disease)    takes Omeprazole daily  . H/O hiatal hernia   . Hyperlipidemia    takes Zocor daily  . Hypertension    takes Amlodipine daily  . Hypoglycemia 06/18/2017  . Neck pain    C4-7 stenosis and herniated disc  . Neuromuscular disorder ([place])    hiatal hernia  . Scoliosis    slight  . Spinal cord injury, C5-C7 ([place])    c4-c7  . Stiffness of hand joint    d/t cervical issues    SURGICAL HISTORY: Past Surgical History:  Procedure Laterality Date  . ANTERIOR CERVICAL DECOMP/DISCECTOMY FUSION  08/18/2011   Procedure: ANTERIOR CERVICAL DECOMPRESSION/DISCECTOMY FUSION 3 LEVELS;  Surgeon: Winfield Cunas, MD;  Location: [place] [facility];  Service: Neurosurgery;  Laterality: N/A;  Anterior Cervical Four-Five/Five-Six/Six-Seven Decompression with Fusion, Plating, and Bonegraft  . CARPAL TUNNEL RELEASE  2013   bilateral, per Dr. Christella Noa   . COLONOSCOPY  10-30-14   per Dr. Olevia Perches, clear, repeat in 10 yrs   . egd with esophageal dilation  9-08   per Dr. Olevia Perches  . ERCP N/A 03/01/2016   Procedure: ENDOSCOPIC RETROGRADE CHOLANGIOPANCREATOGRAPHY (ERCP) with brushings and stent;  Surgeon: Doran Stabler, MD;  Location: [facility];  Service: Endoscopy;  Laterality: N/A;  . ESOPHAGOGASTRODUODENOSCOPY (EGD) WITH PROPOFOL N/A 05/27/2020   Procedure: ESOPHAGOGASTRODUODENOSCOPY (EGD) WITH PROPOFOL;  Surgeon: Milus Banister, MD;  Location: [facility];  Service: Endoscopy;  Laterality: N/A;  . EUS N/A 03/09/2016   Procedure: ESOPHAGEAL ENDOSCOPIC ULTRASOUND (EUS) RADIAL;  Surgeon: Milus Banister, MD;  Location: [facility];  Service: Endoscopy;  Laterality: N/A;  . EUS N/A 05/27/2020    Procedure: UPPER ENDOSCOPIC ULTRASOUND (EUS) RADIAL;  Surgeon: Milus Banister, MD;  Location: [facility];  Service: Endoscopy;  Laterality: N/A;  . lymph nodes biopsy    . melanoma rt calf  1999  . PORT-A-CATH REMOVAL N/A 04/03/2019   Procedure: PORT REMOVAL;  Surgeon: Stark Klein, MD;  Location: [place];  Service: General;  Laterality: N/A;  . PORTACATH PLACEMENT Left 03/22/2016   Procedure: INSERTION PORT-A-CATH;  Surgeon: Stark Klein, MD;  Location: [facility];  Service: General;  Laterality: Left;  . SPINE SURGERY    . TONSILLECTOMY     as a child  . ULNAR TUNNEL RELEASE  2013   right arm, per Dr. Christella Noa   . UPPER GASTROINTESTINAL ENDOSCOPY    . WHIPPLE PROCEDURE N/A 09/19/2016  Procedure: DIAGNOSTIC LAPAROSCOPY, LAPAROSCOPIC LIVER BIOPSY, RETROPERITONEAL EXPLORATION, INTRAOPERATIVE ULTRASOUND;  Surgeon: Stark Klein, MD;  Location: [place];  Service: General;  Laterality: N/A;    I have reviewed the social history and family history with the patient and they are unchanged from previous note.  ALLERGIES:  has No Known Allergies.  MEDICATIONS:  Current Outpatient Medications  Medication Sig Dispense Refill  . acetaminophen (TYLENOL) 500 MG tablet Take 1,000 mg by mouth every 6 (six) hours as needed for moderate pain or headache.    Marland Kitchen amLODipine (NORVASC) 10 MG tablet Take 1 tablet (10 mg total) by mouth daily. 90 tablet 3  . aspirin EC 81 MG tablet Take 81 mg by mouth daily. Swallow whole.    . benazepril (LOTENSIN) 10 MG tablet Take 1 tablet (10 mg total) by mouth daily. 90 tablet 3  . CREON 6000-19000 units CPEP Take 1 capsule by mouth with breakfast, with lunch, and with evening meal.     . diphenhydrAMINE HCl, Sleep, 25 MG TBDP Take 25 mg by mouth at bedtime as needed (sleep).    . ferrous sulfate 325 (65 FE) MG tablet Take 325 mg daily with breakfast by mouth.    Marland Kitchen FREESTYLE LITE test strip USE ONE STRIP TO CHECK GLUCOSE ONCE DAILY. PLEASE  SCHEDULE FOLLOW UP 50 each 0  .  Insulin Detemir (LEVEMIR) 100 UNIT/ML Pen Inject 15 Units into the skin daily at 10 pm. (Patient taking differently: Inject 20-25 Units into the skin See admin instructions. Inject 25 units in the morning and 20 units at night) 90 mL 3  . Insulin Pen Needle 30G X 5 MM MISC Use one daily with insulin 100 each 2  . Lancets (FREESTYLE) lancets USE AS INSTRUCTED TO CHECK BLOOD SUGAR ONCE A DAY 100 each 2  . loratadine (CLARITIN) 10 MG tablet Take 10 mg by mouth daily.    . Multiple Vitamin (MULTIVITAMIN WITH MINERALS) TABS tablet Take 1 tablet by mouth daily.    . naproxen sodium (ANAPROX) 220 MG tablet Take 440 mg by mouth 2 (two) times daily as needed (for pain.).    Marland Kitchen neomycin-polymyxin-hydrocortisone (CORTISPORIN) OTIC solution Apply 1-2 drops to toe after soaking BID 10 mL 1  . NOVOLOG FLEXPEN 100 UNIT/ML FlexPen GIVE EVERY MORNING WITH BREAKFAST AND EVERY EVENING WITH SUPPER PER SLIDING SCALE (Patient taking differently: Inject 3-12 Units into the skin 3 (three) times daily with meals.) 3 mL 0  . Omega-3 Fatty Acids (FISH OIL) 500 MG CAPS Take 500 mg by mouth daily.    Marland Kitchen omeprazole (PRILOSEC) 20 MG capsule TAKE 1 CAPSULE DAILY (Patient taking differently: Take 20 mg by mouth daily.) 90 capsule 3  . potassium chloride SA (KLOR-CON M20) 20 MEQ tablet Take 1 tablet (20 mEq total) by mouth daily. 90 tablet 3  . rosuvastatin (CRESTOR) 20 MG tablet Take 1 tablet (20 mg total) by mouth daily. 90 tablet 3   No current facility-administered medications for this visit.   Facility-Administered Medications Ordered in Other Visits  Medication Dose Route Frequency Provider Last Rate Last Admin  . sodium chloride 0.9 % injection 10 mL  10 mL Intravenous PRN Truitt Merle, MD   10 mL at 02/09/17 1111    PHYSICAL EXAMINATION: ECOG PERFORMANCE STATUS: 0 - Asymptomatic  No vitals taken today, Exam not performed today   LABORATORY DATA:  I have reviewed the data as listed CBC Latest Ref Rng & Units 08/04/2020  07/09/2020 04/14/2020  WBC 4.0 - 10.5 K/uL  7.1 7.6 8.0  Hemoglobin 13.0 - 17.0 g/dL 15.3 14.8 14.5  Hematocrit 39.0 - 52.0 % 45.8 43.1 41.9  Platelets 150 - 400 K/uL 257 253 227     CMP Latest Ref Rng & Units 08/04/2020 07/26/2020 07/09/2020  Glucose 70 - 99 mg/dL 140(H) - 130(H)  BUN 8 - 23 mg/dL 10 - 13  Creatinine 0.61 - 1.24 mg/dL 0.83 - 0.80  Sodium 135 - 145 mmol/L 141 - 139  Potassium 3.5 - 5.1 mmol/L 3.8 - 3.5  Chloride 98 - 111 mmol/L 105 - 104  CO2 22 - 32 mmol/L 28 - 28  Calcium 8.9 - 10.3 mg/dL 9.5 - 9.3  Total Protein 6.5 - 8.1 g/dL 8.3(H) 7.2 7.6  Total Bilirubin 0.3 - 1.2 mg/dL 0.9 0.7 0.7  Alkaline Phos 38 - 126 U/L 895(H) 512(H) 425(H)  AST 15 - 41 U/L 120(H) 72(H) 62(H)  ALT 0 - 44 U/L 145(H) 91(H) 106(H)      RADIOGRAPHIC STUDIES: I have personally reviewed the radiological images as listed and agreed with the findings in the report. No results found.   ASSESSMENT & PLAN:  Colton Bush is a 65 y.o. male with   1. Primary pancreatic adenocarcinoma, in pancreatic head, cT2N1M0, stage IIB, ypT2N0M0.Probable local recurrence in 08/2020 -He was diagnosed in 02/2016. He is s/p neoadjuvant FOLFIRINOX, concurrentchemoRT with Xeloda[facility].He is currently on surveillance. -His 01/2020 CT scan with [facility] showed stable soft tissue at the surgical bed, no other evidence of metastasis. -[facility], overall concerning for cancer recurrence.Ca 19-9 at 1125 on 04/14/20. -HisPET from 04/26/20 showedhypermetabolicuptake atthe porta hepatis surgical site which is suspicious for local cancer recurrence. No evidence of distant metastasis. He remains asymptomatic. -Dr Ardis Hughs was unable to obtain a biopsy with EUS on 05/27/20.He was seen by Dr Mariah Milling at [facility] underwent EUS biopsyon 06/04/20, path was negative. -His 08/31/20 CT and MRI at [facility] showed increased soft tissue at surgical  bed, no evidence of distant metastasis.  -He underwent intragastric biopsy of the soft tissue around the celiac artery at [facility] on September 24, 2020.  I reviewed the pathology report, and discussed with the pathologist Dr. Maida Sale.  Due to the limited sample, scant cells, it is not definitive, but suspicious for metastatic adenocarcinoma.  The immunostain consistent with pancreatobiliary origin. -I previously discussed with Dr. Mariah Milling, will does not think he can offer surgery to completely resectable local recurrence. -I recommend radiation for his probable local recurrence, based on his rising CA19.9, CT scan findings and the recent biopsy result.  I also discussed the option of systemic chemotherapy.  Also post radiation and systemic therapy are likely palliative, not curative, I do think radiation has better chance for local disease control, and overall has less side effects, given he is asymptomatic with good quality of life now. -Patient agrees with the plan.  I will reach out to Dr. Mariah Milling and Dr. [name] to get their opinion.  Patient underwent neoadjuvant radiation about 4 years ago.  If he is not a candidate for second round of radiation, I will reach out to to [facility] rad/onc to see if they can offer proton radiation  -f/u open for now   2. Type 2 DM, HTN -He will continue medication and follow up with his primary care physician Dr. Sarajane Jews  3. Chronic Diarrhea  -Secondary to whipple surgery  -Well controlled/normal on Creon, will continue.Stable.  Plan -path  reviewed, I spoke with pathologist Dr. Maida Sale at [facility]  -I recommend radiation for his local recurrence, will refer him to Dr. [name] -copy Dr. Mariah Milling   No problem-specific Assessment & Plan notes found for this encounter.   No orders of the defined types were placed in this encounter.  I discussed the assessment and treatment plan with the patient. The patient was provided an opportunity to ask questions and all were answered.  The patient agreed with the plan and demonstrated an understanding of the instructions.  The patient was advised to call back or seek an in-person evaluation if the symptoms worsen or if the condition fails to improve as anticipated.  The total time spent in the appointment was 30 minutes.    Truitt Merle, MD 09/30/2020   I, Joslyn Devon, am acting as scribe for Truitt Merle, MD.   I have reviewed the above documentation for accuracy and completeness, and I agree with the above.

## 2020-09-30 ENCOUNTER — Encounter: Payer: Self-pay | Admitting: Hematology

## 2020-09-30 ENCOUNTER — Inpatient Hospital Stay: Payer: Medicare Other | Attending: Hematology | Admitting: Hematology

## 2020-09-30 ENCOUNTER — Telehealth: Payer: Self-pay

## 2020-09-30 DIAGNOSIS — C250 Malignant neoplasm of head of pancreas: Secondary | ICD-10-CM

## 2020-09-30 NOTE — Telephone Encounter (Signed)
Spoke with [facility] to obtain cytology results from 09/24/2020 transcatheter bx of pancreas. I spoke with Margarite Gouge at [facility] and faxed over request.  I also was given pathologist Dr. Jodi Marble Fu's office number and I have left her a message to call Dr. [name] on her cell phone regarding this case.

## 2020-09-30 NOTE — Progress Notes (Signed)
Subjective:   Colton Bush is a 65 y.o. male who presents for an Initial Medicare Annual Wellness Visit.  Review of Systems    N/A  Cardiac Risk Factors include: advanced age (>34men, >83 women);male gender;hypertension;diabetes mellitus     Objective:    Today's Vitals   10/01/20 0855  BP: 130/72  Pulse: 92  Temp: 97.9 F (36.6 C)  TempSrc: Oral  SpO2: 98%  Weight: 193 lb 4 oz (87.7 kg)  Height: 5\' 10"  (1.778 m)   Body mass index is 27.73 kg/m.  Advanced Directives 10/01/2020 04/14/2020 04/01/2019 11/07/2017 06/18/2017 04/20/2017 03/02/2017  Does Patient Have a Medical Advance Directive? No No No No No No No  Would patient like information on creating a medical advance directive? No - Patient declined - No - Patient declined - No - Patient declined No - Patient declined No - Patient declined  Pre-existing out of facility DNR order (yellow form or pink MOST form) - - - - - - -    Current Medications (verified) Outpatient Encounter Medications as of 10/01/2020  Medication Sig  . acetaminophen (TYLENOL) 500 MG tablet Take 1,000 mg by mouth every 6 (six) hours as needed for moderate pain or headache.  Marland Kitchen amLODipine (NORVASC) 10 MG tablet Take 1 tablet (10 mg total) by mouth daily.  Marland Kitchen aspirin EC 81 MG tablet Take 81 mg by mouth daily. Swallow whole.  . B-D UF III MINI PEN NEEDLES 31G X 5 MM MISC Inject into the skin.  Marland Kitchen benazepril (LOTENSIN) 10 MG tablet Take 1 tablet (10 mg total) by mouth daily.  Marland Kitchen CREON 6000-19000 units CPEP Take 1 capsule by mouth with breakfast, with lunch, and with evening meal.   . diphenhydrAMINE HCl, Sleep, 25 MG TBDP Take 25 mg by mouth at bedtime as needed (sleep).  . ferrous sulfate 325 (65 FE) MG tablet Take 325 mg daily with breakfast by mouth.  Marland Kitchen FREESTYLE LITE test strip USE ONE STRIP TO CHECK GLUCOSE ONCE DAILY. PLEASE  SCHEDULE FOLLOW UP  . Insulin Detemir (LEVEMIR) 100 UNIT/ML Pen Inject 15 Units into the skin daily at 10 pm. (Patient taking  differently: Inject 20-25 Units into the skin See admin instructions. Inject 25 units in the morning and 20 units at night)  . Insulin Pen Needle 30G X 5 MM MISC Use one daily with insulin  . Lancets (FREESTYLE) lancets USE AS INSTRUCTED TO CHECK BLOOD SUGAR ONCE A DAY  . loratadine (CLARITIN) 10 MG tablet Take 10 mg by mouth daily.  . Multiple Vitamin (MULTIVITAMIN WITH MINERALS) TABS tablet Take 1 tablet by mouth daily.  . naproxen sodium (ANAPROX) 220 MG tablet Take 440 mg by mouth 2 (two) times daily as needed (for pain.).  Marland Kitchen neomycin-polymyxin-hydrocortisone (CORTISPORIN) OTIC solution Apply 1-2 drops to toe after soaking BID  . NOVOLOG FLEXPEN 100 UNIT/ML FlexPen GIVE EVERY MORNING WITH BREAKFAST AND EVERY EVENING WITH SUPPER PER SLIDING SCALE (Patient taking differently: Inject 3-12 Units into the skin 3 (three) times daily with meals.)  . Omega-3 Fatty Acids (FISH OIL) 500 MG CAPS Take 500 mg by mouth daily.  Marland Kitchen omeprazole (PRILOSEC) 20 MG capsule TAKE 1 CAPSULE DAILY (Patient taking differently: Take 20 mg by mouth daily.)  . potassium chloride SA (KLOR-CON M20) 20 MEQ tablet Take 1 tablet (20 mEq total) by mouth daily.  . rosuvastatin (CRESTOR) 20 MG tablet Take 1 tablet (20 mg total) by mouth daily.  . [DISCONTINUED] insulin detemir (LEVEMIR FLEXTOUCH) 100 UNIT/ML FlexPen  36uam/25upm   Facility-Administered Encounter Medications as of 10/01/2020  Medication  . sodium chloride 0.9 % injection 10 mL    Allergies (verified) Patient has no known allergies.   History: Past Medical History:  Diagnosis Date  . Arthritis    left hand  . Bronchitis 1977  . Cancer ([place]) 03/09/2016   pancreatic cancer, sees Dr. Cristino Martes at [facility]   . Depression    takes Cymbalta daily  . Diabetes mellitus type II    sees Dr. Chalmers Cater   . GERD (gastroesophageal reflux disease)    takes Omeprazole daily  . H/O hiatal hernia   . Hyperlipidemia    takes Zocor daily  . Hypertension    takes Amlodipine  daily  . Hypoglycemia 06/18/2017  . Neck pain    C4-7 stenosis and herniated disc  . Neuromuscular disorder ([place])    hiatal hernia  . Scoliosis    slight  . Spinal cord injury, C5-C7 ([place])    c4-c7  . Stiffness of hand joint    d/t cervical issues   Past Surgical History:  Procedure Laterality Date  . ANTERIOR CERVICAL DECOMP/DISCECTOMY FUSION  08/18/2011   Procedure: ANTERIOR CERVICAL DECOMPRESSION/DISCECTOMY FUSION 3 LEVELS;  Surgeon: Winfield Cunas, MD;  Location: [facility];  Service: Neurosurgery;  Laterality: N/A;  Anterior Cervical Four-Five/Five-Six/Six-Seven Decompression with Fusion, Plating, and Bonegraft  . CARPAL TUNNEL RELEASE  2013   bilateral, per Dr. Christella Noa   . COLONOSCOPY  10-30-14   per Dr. Olevia Perches, clear, repeat in 10 yrs   . egd with esophageal dilation  9-08   per Dr. Olevia Perches  . ERCP N/A 03/01/2016   Procedure: ENDOSCOPIC RETROGRADE CHOLANGIOPANCREATOGRAPHY (ERCP) with brushings and stent;  Surgeon: Doran Stabler, MD;  Location: [facility];  Service: Endoscopy;  Laterality: N/A;  . ESOPHAGOGASTRODUODENOSCOPY (EGD) WITH PROPOFOL N/A 05/27/2020   Procedure: ESOPHAGOGASTRODUODENOSCOPY (EGD) WITH PROPOFOL;  Surgeon: Milus Banister, MD;  Location: [facility];  Service: Endoscopy;  Laterality: N/A;  . EUS N/A 03/09/2016   Procedure: ESOPHAGEAL ENDOSCOPIC ULTRASOUND (EUS) RADIAL;  Surgeon: Milus Banister, MD;  Location: [facility];  Service: Endoscopy;  Laterality: N/A;  . EUS N/A 05/27/2020   Procedure: UPPER ENDOSCOPIC ULTRASOUND (EUS) RADIAL;  Surgeon: Milus Banister, MD;  Location: [facility];  Service: Endoscopy;  Laterality: N/A;  . lymph nodes biopsy    . melanoma rt calf  1999  . PORT-A-CATH REMOVAL N/A 04/03/2019   Procedure: PORT REMOVAL;  Surgeon: Stark Klein, MD;  Location: [facility];  Service: General;  Laterality: N/A;  . PORTACATH PLACEMENT Left 03/22/2016   Procedure: INSERTION PORT-A-CATH;  Surgeon: Stark Klein, MD;  Location: [facility];   Service: General;  Laterality: Left;  . SPINE SURGERY    . TONSILLECTOMY     as a child  . ULNAR TUNNEL RELEASE  2013   right arm, per Dr. Christella Noa   . UPPER GASTROINTESTINAL ENDOSCOPY    . WHIPPLE PROCEDURE N/A 09/19/2016   Procedure: DIAGNOSTIC LAPAROSCOPY, LAPAROSCOPIC LIVER BIOPSY, RETROPERITONEAL EXPLORATION, INTRAOPERATIVE ULTRASOUND;  Surgeon: Stark Klein, MD;  Location: [facility] OR;  Service: General;  Laterality: N/A;   Family History  Problem Relation Age of Onset  . Heart disease Father   . Heart disease Brother 56  . Anesthesia problems Mother   . Heart disease Mother   . Dementia Mother   . Diabetes Sister   . Stroke Sister   . Colon cancer Neg Hx   . Rectal cancer Neg Hx   .  Stomach cancer Neg Hx    Social History   Socioeconomic History  . Marital status: Married    Spouse name: [name]  . Number of children: 1  . Years of education: Not on file  . Highest education level: Not on file  Occupational History  . Occupation: Geophysicist/field seismologist  Tobacco Use  . Smoking status: Never Smoker  . Smokeless tobacco: Never Used  . Tobacco comment: tried a pipe 35 years ago   Vaping Use  . Vaping Use: Never used  Substance and Sexual Activity  . Alcohol use: No    Alcohol/week: 0.0 standard drinks  . Drug use: No  . Sexual activity: Yes  Other Topics Concern  . 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 Difficulty of Paying Living Expenses: Not hard at all  Food Insecurity: No Food Insecurity  . Worried About Charity fundraiser in the Last Year: Never true  . Ran Out of Food in the Last Year: Never true  Transportation Needs: No Transportation Needs  . Lack of Transportation (Medical): No  . Lack of Transportation (Non-Medical): No  Physical Activity: Inactive  . Days of Exercise per Week: 0 days  . Minutes of Exercise per Session: 0 min  Stress: No Stress  Concern Present  . Feeling of Stress : Not at all  Social Connections: Socially Isolated  . Frequency of Communication with Friends and Family: Twice a week  . Frequency of Social Gatherings with Friends and Family: Never  . Attends Religious Services: Never  . Active Member of Clubs or Organizations: No  . Attends Archivist Meetings: Never  . Marital Status: Married    Tobacco Counseling Counseling given: Not Answered Comment: tried a pipe 35 years ago    Clinical Intake:  Pre-visit preparation completed: Yes  Pain : No/denies pain     Nutritional Risks: Nausea/ vomitting/ diarrhea,Unintentional weight loss (Nausea) Diabetes: Yes CBG done?: No Did pt. bring in CBG monitor from home?: No  How often do you need to have someone help you when you read instructions, pamphlets, or other written materials from your doctor or pharmacy?: 1 - Never What is the last grade level you completed in school?: High School  Diabetic?Yes  Nutrition Risk Assessment:  Has the patient had any N/V/D within the last 2 months?  Yes  Does the patient have any non-healing wounds?  No  Has the patient had any unintentional weight loss or weight gain?  Yes   Diabetes:  Is the patient diabetic?  Yes  If diabetic, was a CBG obtained today?  No  Did the patient bring in their glucometer from home?  No  How often do you monitor your CBG's? States glucose every morning and sometimes before dinner.   Financial Strains and Diabetes Management:  Are you having any financial strains with the device, your supplies or your medication? No .  Does the patient want to be seen by Chronic Care Management for management of their diabetes?  No  Would the patient like to be referred to a Nutritionist or for Diabetic Management?  No   Diabetic Exams:  Diabetic Eye Exam: Overdue for diabetic eye exam. Pt has been advised about the importance in completing this exam. Patient advised to call and  schedule an eye exam. Diabetic Foot Exam: Overdue, Pt has been advised  about the importance in completing this exam. Pt is scheduled for diabetic foot exam on next appointment with endocrinologist .   Interpreter Needed?: No  Information entered by :: [place] of Daily Living In your present state of health, do you have any difficulty performing the following activities: 10/01/2020  Hearing? N  Vision? N  Difficulty concentrating or making decisions? N  Walking or climbing stairs? N  Dressing or bathing? N  Doing errands, shopping? N  Preparing Food and eating ? N  Using the Toilet? N  In the past six months, have you accidently leaked urine? N  Do you have problems with loss of bowel control? N  Managing your Medications? N  Managing your Finances? N  Housekeeping or managing your Housekeeping? N  Some recent data might be hidden    Patient Care Team: [name], MD as PCP - General  Indicate any recent Medical Services you may have received from other than [facility] providers in the past year (date may be approximate).     Assessment:   This is a routine wellness examination for Eluterio.  Hearing/Vision screen  Hearing Screening   125Hz  250Hz  500Hz  1000Hz  2000Hz  3000Hz  4000Hz  6000Hz  8000Hz   Right ear:           Left ear:           Vision Screening Comments: Gets eyes examined once per year. Wears reading glasses only  Dietary issues and exercise activities discussed: Current Exercise Habits: The patient does not participate in regular exercise at present  Goals    . Patient Stated     I would like to have a relationship with my son!      Depression Screen PHQ 2/9 Scores 10/01/2020 07/22/2020 09/13/2016 05/29/2016 10/07/2015  PHQ - 2 Score 0 0 0 0 0    Fall Risk Fall Risk  10/01/2020 07/22/2020 09/13/2016 06/14/2016 05/29/2016  Falls in the past year? 0 0 No No No  Number falls in past yr: 0 - - - -  Injury with Fall? 0 - - - -  Risk for fall due to :  No Fall Risks - - - -  Follow up Falls evaluation completed;Falls prevention discussed Falls evaluation completed - - -    FALL RISK PREVENTION PERTAINING TO THE HOME:  Any stairs in or around the home? Yes  If so, are there any without handrails? No  Home free of loose throw rugs in walkways, pet beds, electrical cords, etc? Yes  Adequate lighting in your home to reduce risk of falls? Yes   ASSISTIVE DEVICES UTILIZED TO PREVENT FALLS:  Life alert? No  Use of a cane, walker or w/c? No  Grab bars in the bathroom? No  Shower chair or bench in shower? No  Elevated toilet seat or a handicapped toilet? No   TIMED UP AND GO:  Was the test performed? Yes .  Length of time to ambulate 10 feet: 4 sec.   Gait steady and fast without use of assistive device  Cognitive Function:        Immunizations Immunization History  Administered Date(s) Administered  . PFIZER(Purple Top)SARS-COV-2 Vaccination 09/08/2019, 10/03/2019, 05/13/2020    TDAP status: Due, Education has been provided regarding the importance of this vaccine. Advised may receive this vaccine at local pharmacy or Health Dept. Aware to provide a copy of the vaccination record if obtained from local pharmacy or Health Dept. Verbalized acceptance and understanding.  Flu Vaccine status: Due,  Education has been provided regarding the importance of this vaccine. Advised may receive this vaccine at local pharmacy or Health Dept. Aware to provide a copy of the vaccination record if obtained from local pharmacy or Health Dept. Verbalized acceptance and understanding.  Pneumococcal vaccine status: Declined,  Education has been provided regarding the importance of this vaccine but patient still declined. Advised may receive this vaccine at local pharmacy or Health Dept. Aware to provide a copy of the vaccination record if obtained from local pharmacy or Health Dept. Verbalized acceptance and understanding.   Covid-19 vaccine status:  Completed vaccines  Qualifies for Shingles Vaccine? Yes   Zostavax completed No   Shingrix Completed?: No.    Education has been provided regarding the importance of this vaccine. Patient has been advised to call insurance company to determine out of pocket expense if they have not yet received this vaccine. Advised may also receive vaccine at local pharmacy or Health Dept. Verbalized acceptance and understanding.  Screening Tests Health Maintenance  Topic Date Due  . TETANUS/TDAP  Never done  . FOOT EXAM  07/14/2016  . PNA vac Low Risk Adult (1 of 2 - PCV13) Never done  . OPHTHALMOLOGY EXAM  11/01/2018  . HEMOGLOBIN A1C  09/29/2019  . INFLUENZA VACCINE  10/28/2020 (Originally 02/29/2020)  . COVID-19 Vaccine (4 - Booster for Pfizer series) 11/11/2020  . COLONOSCOPY (Pts 45-65yrs Insurance coverage will need to be confirmed)  10/29/2024  . Hepatitis C Screening  Completed  . HPV VACCINES  Aged Out    Health Maintenance  Health Maintenance Due  Topic Date Due  . TETANUS/TDAP  Never done  . FOOT EXAM  07/14/2016  . PNA vac Low Risk Adult (1 of 2 - PCV13) Never done  . OPHTHALMOLOGY EXAM  11/01/2018  . HEMOGLOBIN A1C  09/29/2019    Colorectal cancer screening: Type of screening: Colonoscopy. Completed 10/30/2014. Repeat every 10 years  Lung Cancer Screening: (Low Dose CT Chest recommended if Age 47-80 years, 30 pack-year currently smoking OR have quit w/in 15years.) does not qualify.   Lung Cancer Screening Referral: N/A   Additional Screening:  Hepatitis C Screening: does qualify; Completed 02/25/2016  Vision Screening: Recommended annual ophthalmology exams for early detection of glaucoma and other disorders of the eye. Is the patient up to date with their annual eye exam?  Yes  Who is the provider or what is the name of the office in which the patient attends annual eye exams? Dr. Macarthur Critchley  If pt is not established with a provider, would they like to be referred to a  provider to establish care? No .   Dental Screening: Recommended annual dental exams for proper oral hygiene  Community Resource Referral / Chronic Care Management: CRR required this visit?  No   CCM required this visit?  No      Plan:     I have personally reviewed and noted the following in the patient's chart:   . Medical and social history . Use of alcohol, tobacco or illicit drugs  . Current medications and supplements . Functional ability and status . Nutritional status . Physical activity . Advanced directives . List of other physicians . Hospitalizations, surgeries, and ER visits in previous 12 months . Vitals . Screenings to include cognitive, depression, and falls . Referrals and appointments  In addition, I have reviewed and discussed with patient certain preventive protocols, quality metrics, and best practice recommendations. A written personalized care plan for preventive services as  well as general preventive health recommendations were provided to patient.     Ofilia Neas, LPN   0/07/5613   Nurse Notes: None

## 2020-10-01 ENCOUNTER — Telehealth: Payer: Self-pay

## 2020-10-01 ENCOUNTER — Telehealth: Payer: Self-pay | Admitting: Hematology

## 2020-10-01 ENCOUNTER — Other Ambulatory Visit: Payer: Self-pay

## 2020-10-01 ENCOUNTER — Ambulatory Visit (INDEPENDENT_AMBULATORY_CARE_PROVIDER_SITE_OTHER): Payer: Medicare Other

## 2020-10-01 VITALS — BP 130/72 | HR 92 | Temp 97.9°F | Ht 70.0 in | Wt 193.2 lb

## 2020-10-01 DIAGNOSIS — Z0000 Encounter for general adult medical examination without abnormal findings: Secondary | ICD-10-CM

## 2020-10-01 NOTE — Telephone Encounter (Signed)
09/30/2020 ov note faxed to Dr Mariah Milling at [phone].

## 2020-10-01 NOTE — Telephone Encounter (Signed)
Sent [facility] report from 09/24/2020 to HIM to be scanned into patient's chart.

## 2020-10-01 NOTE — Patient Instructions (Signed)
Colton Bush , Thank you for taking time to come for your Medicare Wellness Visit. I appreciate your ongoing commitment to your health goals. Please review the following plan 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 and let me know if I can assist you in the future.   Screening recommendations/referrals: Colonoscopy: Up to date, next due 10/29/2024 Recommended yearly ophthalmology/optometry visit for glaucoma screening and checkup Recommended yearly dental visit for hygiene and checkup  Vaccinations: Influenza vaccine: Patient declined  Pneumococcal vaccine: Patient declined  Tdap vaccine: Patient declined  Shingles vaccine: Patient declined     Advanced directives: Advance directive discussed with you today. Even though you declined this today please call our office should you change your mind and we can give you the proper paperwork for you to fill out.   Conditions/risks identified: None   Next appointment: None   Preventive Care 65 Years and Older, Male Preventive care refers to lifestyle choices and visits with your health care provider that can promote health and wellness. What does preventive care include?  A yearly physical exam. This is also called an annual well check.  Dental exams once or twice a year.  Routine eye exams. Ask your health care provider how often you should have your eyes checked.  Personal lifestyle choices, including:  Daily care of your teeth and gums.  Regular physical activity.  Eating a healthy diet.  Avoiding tobacco and drug use.  Limiting alcohol use.  Practicing safe sex.  Taking low doses of aspirin every day.  Taking vitamin and mineral supplements as recommended by your health care provider. What happens during an annual well check? The services and screenings done by your health care provider during your annual well check will depend on your age, overall health, lifestyle risk factors, and family history of disease. Counseling  Your health care provider may  ask you questions about your:  Alcohol use.  Tobacco use.  Drug use.  Emotional well-being.  Home and relationship well-being.  Sexual activity.  Eating habits.  History of falls.  Memory and ability to understand (cognition).  Work and work Statistician. Screening  You may have the following tests or measurements:  Height, weight, and BMI.  Blood pressure.  Lipid and cholesterol levels. These may be checked every 5 years, or more frequently if you are over 67 years old.  Skin check.  Lung cancer screening. You may have this screening every year starting at age 70 if you have a 30-pack-year history of smoking and currently smoke or have quit within the past 15 years.  Fecal occult blood test (FOBT) of the stool. You may have this test every year starting at age 43.  Flexible sigmoidoscopy or colonoscopy. You may have a sigmoidoscopy every 5 years or a colonoscopy every 10 years starting at age 40.  Prostate cancer screening. Recommendations will vary depending on your family history and other risks.  Hepatitis C blood test.  Hepatitis B blood test.  Sexually transmitted disease (STD) testing.  Diabetes screening. This is done by checking your blood sugar (glucose) after you have not eaten for a while (fasting). You may have this done every 1-3 years.  Abdominal aortic aneurysm (AAA) screening. You may need this if you are a current or former smoker.  Osteoporosis. You may be screened starting at age 49 if you are at high risk. Talk with your health care provider about your test results, treatment options, and if necessary, the need for more tests. Vaccines  Your health care provider may  recommend certain vaccines, such as:  Influenza vaccine. This is recommended every year.  Tetanus, diphtheria, and acellular pertussis (Tdap, Td) vaccine. You may need a Td booster every 10 years.  Zoster vaccine. You may need this after age 13.  Pneumococcal 13-valent  conjugate (PCV13) vaccine. One dose is recommended after age 47.  Pneumococcal polysaccharide (PPSV23) vaccine. One dose is recommended after age 22. Talk to your health care provider about which screenings and vaccines you need and how often you need them. This information is not intended to replace advice given to you by your health care provider. Make sure you discuss any questions you have with your health care provider. Document Released: 08/13/2015 Document Revised: 04/05/2016 Document Reviewed: 05/18/2015 Elsevier Interactive Patient Education  2017 [place] Prevention in the Home Falls can cause injuries. They can happen to people of all ages. There are many things you can do to make your home safe and to help prevent falls. What can I do on the outside of my home?  Regularly fix the edges of walkways and driveways and fix any cracks.  Remove anything that might make you trip as you walk through a door, such as a raised step or threshold.  Trim any bushes or trees on the path to your home.  Use bright outdoor lighting.  Clear any walking paths of anything that might make someone trip, such as rocks or tools.  Regularly check to see if handrails are loose or broken. Make sure that both sides of any steps have handrails.  Any raised decks and porches should have guardrails on the edges.  Have any leaves, snow, or ice cleared regularly.  Use sand or salt on walking paths during winter.  Clean up any spills in your garage right away. This includes oil or grease spills. What can I do in the bathroom?  Use night lights.  Install grab bars by the toilet and in the tub and shower. Do not use towel bars as grab bars.  Use non-skid mats or decals in the tub or shower.  If you need to sit down in the shower, use a plastic, non-slip stool.  Keep the floor dry. Clean up any water that spills on the floor as soon as it happens.  Remove soap buildup in the tub or  shower regularly.  Attach bath mats securely with double-sided non-slip rug tape.  Do not have throw rugs and other things on the floor that can make you trip. What can I do in the bedroom?  Use night lights.  Make sure that you have a light by your bed that is easy to reach.  Do not use any sheets or blankets that are too big for your bed. They should not hang down onto the floor.  Have a firm chair that has side arms. You can use this for support while you get dressed.  Do not have throw rugs and other things on the floor that can make you trip. What can I do in the kitchen?  Clean up any spills right away.  Avoid walking on wet floors.  Keep items that you use a lot in easy-to-reach places.  If you need to reach something above you, use a strong step stool that has a grab bar.  Keep electrical cords out of the way.  Do not use floor polish or wax that makes floors slippery. If you must use wax, use non-skid floor wax.  Do not have throw rugs and  other things on the floor that can make you trip. What can I do with my stairs?  Do not leave any items on the stairs.  Make sure that there are handrails on both sides of the stairs and use them. Fix handrails that are broken or loose. Make sure that handrails are as long as the stairways.  Check any carpeting to make sure that it is firmly attached to the stairs. Fix any carpet that is loose or worn.  Avoid having throw rugs at the top or bottom of the stairs. If you do have throw rugs, attach them to the floor with carpet tape.  Make sure that you have a light switch at the top of the stairs and the bottom of the stairs. If you do not have them, ask someone to add them for you. What else can I do to help prevent falls?  Wear shoes that:  Do not have high heels.  Have rubber bottoms.  Are comfortable and fit you well.  Are closed at the toe. Do not wear sandals.  If you use a stepladder:  Make sure that it is fully  opened. Do not climb a closed stepladder.  Make sure that both sides of the stepladder are locked into place.  Ask someone to hold it for you, if possible.  Clearly mark and make sure that you can see:  Any grab bars or handrails.  First and last steps.  Where the edge of each step is.  Use tools that help you move around (mobility aids) if they are needed. These include:  Canes.  Walkers.  Scooters.  Crutches.  Turn on the lights when you go into a dark area. Replace any light bulbs as soon as they burn out.  Set up your furniture so you have a clear path. Avoid moving your furniture around.  If any of your floors are uneven, fix them.  If there are any pets around you, be aware of where they are.  Review your medicines with your doctor. Some medicines can make you feel dizzy. This can increase your chance of falling. Ask your doctor what other things that you can do to help prevent falls. This information is not intended to replace advice given to you by your health care provider. Make sure you discuss any questions you have with your health care provider. Document Released: 05/13/2009 Document Revised: 12/23/2015 Document Reviewed: 08/21/2014 Elsevier Interactive Patient Education  2017 [employer].

## 2020-10-01 NOTE — Telephone Encounter (Signed)
Checked out appointment. No LOS notes needing to be scheduled. No changes made. 

## 2020-10-04 ENCOUNTER — Other Ambulatory Visit: Payer: Self-pay | Admitting: Family Medicine

## 2020-10-06 ENCOUNTER — Telehealth (INDEPENDENT_AMBULATORY_CARE_PROVIDER_SITE_OTHER): Payer: Medicare Other | Admitting: Family Medicine

## 2020-10-06 ENCOUNTER — Encounter: Payer: Self-pay | Admitting: Family Medicine

## 2020-10-06 ENCOUNTER — Telehealth: Payer: Self-pay

## 2020-10-06 VITALS — Temp 96.2°F | Wt 193.0 lb

## 2020-10-06 DIAGNOSIS — R6883 Chills (without fever): Secondary | ICD-10-CM | POA: Diagnosis not present

## 2020-10-06 NOTE — Telephone Encounter (Signed)
Colton Bush called stating he has chills, did not check temp.  Denies cough, nasal congestion and sore throat. He is not undergoing any cancer treatment at this time.  I advised him to call his PCP.  He verbalized understanding.

## 2020-10-06 NOTE — Progress Notes (Signed)
   Subjective:    Patient ID: Colton Bush, male    DOB: 06/26/1956, 65 y.o.   MRN: [id number]  HPI Virtual Visit via Telephone Note  I connected with the patient on 10/06/20 at  2:15 PM EST by telephone and verified that I am speaking with the correct person using two identifiers.   I discussed the limitations, risks, security and privacy concerns of performing an evaluation and management service by telephone and the availability of in person appointments. I also discussed with the patient that there may be a patient responsible charge related to this service. The patient expressed understanding and agreed to proceed.  Location patient: home Location provider: work or home office Participants present for the call: patient, provider Patient did not have a visit in the prior 7 days to address this/these issue(s).   History of Present Illness: Here for feeling intermittent chills for the past 3 days. No other symptoms at all. His temp today at noon was 96.2 degrees and his glucose was 88. No recent medication changes. He does note that a recent biopsy has shown that his pancreatic cancer has returned. Dr. [name] says this is not operable and he is probably going to get chemotherapy again.    Observations/Objective: Patient sounds cheerful and well on the phone. I do not appreciate any SOB. Speech and thought processing are grossly intact. Patient reported vitals:  Assessment and Plan:   Follow Up Instructions: He has chills of uncertain etiology. He may be fighting a viral infection of some sort. I advised him to get tested for the Covid virus, and he agreed to do this today. He will return if he develops any other symptoms.  Alysia Penna, MD     [phone] 21-30 I did not refer this patient for an OV in the next 24 hours for this/these issue(s).  I discussed the assessment and treatment plan with the patient. The patient was provided an opportunity to ask  questions and all were answered. The patient agreed with the plan and demonstrated an understanding of the instructions.   The patient was advised to call back or seek an in-person evaluation if the symptoms worsen or if the condition fails to improve as anticipated.  I provided 11 minutes of non-face-to-face time during this encounter.   Alysia Penna, MD   Review of Systems     Objective:   Physical Exam        Assessment & Plan:

## 2020-10-07 ENCOUNTER — Telehealth: Payer: Self-pay

## 2020-10-07 NOTE — Telephone Encounter (Signed)
Colton Bush called wanting to know what his treatment will be and when.

## 2020-10-08 ENCOUNTER — Telehealth: Payer: Self-pay | Admitting: Hematology

## 2020-10-08 NOTE — Telephone Encounter (Signed)
I called pt back and informed him that Dr. [name] has reviewed his previous treatment and recent CT scan and is willing to try second round radiation for local recurrence. Pt agrees to proceed. I will remind Dr. Ida Rogue office to set up his consultation appointment.   Colton Bush  10/08/2020

## 2020-10-14 ENCOUNTER — Ambulatory Visit
Admission: RE | Admit: 2020-10-14 | Discharge: 2020-10-14 | Disposition: A | Discharge status: Home / Self Care | Payer: Self-pay | Source: Ambulatory Visit | Attending: Radiation Oncology | Admitting: Radiation Oncology

## 2020-10-14 ENCOUNTER — Other Ambulatory Visit: Payer: Self-pay | Admitting: Radiation Oncology

## 2020-10-14 DIAGNOSIS — C250 Malignant neoplasm of head of pancreas: Secondary | ICD-10-CM

## 2020-10-18 ENCOUNTER — Other Ambulatory Visit: Payer: Self-pay

## 2020-10-18 ENCOUNTER — Encounter (HOSPITAL_COMMUNITY): Payer: Self-pay

## 2020-10-18 ENCOUNTER — Inpatient Hospital Stay (HOSPITAL_COMMUNITY)
Admission: EM | Admit: 2020-10-18 | Discharge: 2020-10-28 | DRG: 481 | Disposition: A | Discharge status: Skilled Nursing Facility | Payer: Medicare Other | Attending: Internal Medicine | Admitting: Internal Medicine

## 2020-10-18 ENCOUNTER — Emergency Department (HOSPITAL_COMMUNITY): Payer: Medicare Other

## 2020-10-18 DIAGNOSIS — I10 Essential (primary) hypertension: Secondary | ICD-10-CM

## 2020-10-18 DIAGNOSIS — E785 Hyperlipidemia, unspecified: Secondary | ICD-10-CM | POA: Diagnosis present

## 2020-10-18 DIAGNOSIS — M11261 Other chondrocalcinosis, right knee: Secondary | ICD-10-CM | POA: Diagnosis not present

## 2020-10-18 DIAGNOSIS — C250 Malignant neoplasm of head of pancreas: Secondary | ICD-10-CM | POA: Diagnosis present

## 2020-10-18 DIAGNOSIS — E782 Mixed hyperlipidemia: Secondary | ICD-10-CM | POA: Diagnosis not present

## 2020-10-18 DIAGNOSIS — W1830XA Fall on same level, unspecified, initial encounter: Secondary | ICD-10-CM | POA: Diagnosis not present

## 2020-10-18 DIAGNOSIS — Z833 Family history of diabetes mellitus: Secondary | ICD-10-CM | POA: Diagnosis not present

## 2020-10-18 DIAGNOSIS — M2550 Pain in unspecified joint: Secondary | ICD-10-CM | POA: Diagnosis not present

## 2020-10-18 DIAGNOSIS — N390 Urinary tract infection, site not specified: Secondary | ICD-10-CM | POA: Diagnosis not present

## 2020-10-18 DIAGNOSIS — Z79899 Other long term (current) drug therapy: Secondary | ICD-10-CM | POA: Diagnosis not present

## 2020-10-18 DIAGNOSIS — E1165 Type 2 diabetes mellitus with hyperglycemia: Secondary | ICD-10-CM | POA: Diagnosis not present

## 2020-10-18 DIAGNOSIS — Z818 Family history of other mental and behavioral disorders: Secondary | ICD-10-CM

## 2020-10-18 DIAGNOSIS — R6510 Systemic inflammatory response syndrome (SIRS) of non-infectious origin without acute organ dysfunction: Secondary | ICD-10-CM | POA: Diagnosis not present

## 2020-10-18 DIAGNOSIS — R0902 Hypoxemia: Secondary | ICD-10-CM | POA: Diagnosis not present

## 2020-10-18 DIAGNOSIS — Z981 Arthrodesis status: Secondary | ICD-10-CM | POA: Diagnosis not present

## 2020-10-18 DIAGNOSIS — I119 Hypertensive heart disease without heart failure: Secondary | ICD-10-CM | POA: Diagnosis not present

## 2020-10-18 DIAGNOSIS — M7989 Other specified soft tissue disorders: Secondary | ICD-10-CM | POA: Diagnosis not present

## 2020-10-18 DIAGNOSIS — K219 Gastro-esophageal reflux disease without esophagitis: Secondary | ICD-10-CM | POA: Diagnosis not present

## 2020-10-18 DIAGNOSIS — W010XXA Fall on same level from slipping, tripping and stumbling without subsequent striking against object, initial encounter: Secondary | ICD-10-CM | POA: Diagnosis present

## 2020-10-18 DIAGNOSIS — Z4789 Encounter for other orthopedic aftercare: Secondary | ICD-10-CM | POA: Diagnosis not present

## 2020-10-18 DIAGNOSIS — Z20822 Contact with and (suspected) exposure to covid-19: Secondary | ICD-10-CM | POA: Diagnosis present

## 2020-10-18 DIAGNOSIS — M1711 Unilateral primary osteoarthritis, right knee: Secondary | ICD-10-CM | POA: Diagnosis not present

## 2020-10-18 DIAGNOSIS — Z7982 Long term (current) use of aspirin: Secondary | ICD-10-CM | POA: Diagnosis not present

## 2020-10-18 DIAGNOSIS — R338 Other retention of urine: Secondary | ICD-10-CM

## 2020-10-18 DIAGNOSIS — S065X9D Traumatic subdural hemorrhage with loss of consciousness of unspecified duration, subsequent encounter: Secondary | ICD-10-CM | POA: Diagnosis not present

## 2020-10-18 DIAGNOSIS — R7881 Bacteremia: Secondary | ICD-10-CM | POA: Diagnosis not present

## 2020-10-18 DIAGNOSIS — S72041A Displaced fracture of base of neck of right femur, initial encounter for closed fracture: Secondary | ICD-10-CM

## 2020-10-18 DIAGNOSIS — J9811 Atelectasis: Secondary | ICD-10-CM | POA: Diagnosis not present

## 2020-10-18 DIAGNOSIS — E1169 Type 2 diabetes mellitus with other specified complication: Secondary | ICD-10-CM | POA: Diagnosis present

## 2020-10-18 DIAGNOSIS — E11649 Type 2 diabetes mellitus with hypoglycemia without coma: Secondary | ICD-10-CM | POA: Diagnosis not present

## 2020-10-18 DIAGNOSIS — M6281 Muscle weakness (generalized): Secondary | ICD-10-CM | POA: Diagnosis not present

## 2020-10-18 DIAGNOSIS — W19XXXA Unspecified fall, initial encounter: Secondary | ICD-10-CM | POA: Diagnosis not present

## 2020-10-18 DIAGNOSIS — M795 Residual foreign body in soft tissue: Secondary | ICD-10-CM | POA: Diagnosis not present

## 2020-10-18 DIAGNOSIS — E119 Type 2 diabetes mellitus without complications: Secondary | ICD-10-CM | POA: Diagnosis not present

## 2020-10-18 DIAGNOSIS — Z90411 Acquired partial absence of pancreas: Secondary | ICD-10-CM | POA: Diagnosis not present

## 2020-10-18 DIAGNOSIS — B9620 Unspecified Escherichia coli [E. coli] as the cause of diseases classified elsewhere: Secondary | ICD-10-CM | POA: Diagnosis present

## 2020-10-18 DIAGNOSIS — Z8249 Family history of ischemic heart disease and other diseases of the circulatory system: Secondary | ICD-10-CM | POA: Diagnosis not present

## 2020-10-18 DIAGNOSIS — Y92512 Supermarket, store or market as the place of occurrence of the external cause: Secondary | ICD-10-CM | POA: Diagnosis not present

## 2020-10-18 DIAGNOSIS — B955 Unspecified streptococcus as the cause of diseases classified elsewhere: Secondary | ICD-10-CM | POA: Diagnosis present

## 2020-10-18 DIAGNOSIS — S72001A Fracture of unspecified part of neck of right femur, initial encounter for closed fracture: Secondary | ICD-10-CM | POA: Diagnosis not present

## 2020-10-18 DIAGNOSIS — F32A Depression, unspecified: Secondary | ICD-10-CM | POA: Diagnosis present

## 2020-10-18 DIAGNOSIS — K750 Abscess of liver: Secondary | ICD-10-CM | POA: Diagnosis not present

## 2020-10-18 DIAGNOSIS — G929 Unspecified toxic encephalopathy: Secondary | ICD-10-CM | POA: Diagnosis not present

## 2020-10-18 DIAGNOSIS — J869 Pyothorax without fistula: Secondary | ICD-10-CM | POA: Diagnosis not present

## 2020-10-18 DIAGNOSIS — Z741 Need for assistance with personal care: Secondary | ICD-10-CM | POA: Diagnosis not present

## 2020-10-18 DIAGNOSIS — S72141A Displaced intertrochanteric fracture of right femur, initial encounter for closed fracture: Secondary | ICD-10-CM

## 2020-10-18 DIAGNOSIS — M25551 Pain in right hip: Secondary | ICD-10-CM | POA: Diagnosis not present

## 2020-10-18 DIAGNOSIS — Z7401 Bed confinement status: Secondary | ICD-10-CM | POA: Diagnosis not present

## 2020-10-18 DIAGNOSIS — M25651 Stiffness of right hip, not elsewhere classified: Secondary | ICD-10-CM | POA: Diagnosis not present

## 2020-10-18 DIAGNOSIS — R197 Diarrhea, unspecified: Secondary | ICD-10-CM | POA: Diagnosis present

## 2020-10-18 DIAGNOSIS — C259 Malignant neoplasm of pancreas, unspecified: Secondary | ICD-10-CM | POA: Diagnosis not present

## 2020-10-18 DIAGNOSIS — S72101A Unspecified trochanteric fracture of right femur, initial encounter for closed fracture: Secondary | ICD-10-CM | POA: Diagnosis not present

## 2020-10-18 DIAGNOSIS — R262 Difficulty in walking, not elsewhere classified: Secondary | ICD-10-CM | POA: Diagnosis not present

## 2020-10-18 DIAGNOSIS — R293 Abnormal posture: Secondary | ICD-10-CM | POA: Diagnosis not present

## 2020-10-18 DIAGNOSIS — Z794 Long term (current) use of insulin: Secondary | ICD-10-CM

## 2020-10-18 DIAGNOSIS — M25572 Pain in left ankle and joints of left foot: Secondary | ICD-10-CM | POA: Diagnosis not present

## 2020-10-18 LAB — CBC WITH DIFFERENTIAL/PLATELET
Abs Immature Granulocytes: 0.19 10*3/uL — ABNORMAL HIGH (ref 0.00–0.07)
Basophils Absolute: 0.1 10*3/uL (ref 0.0–0.1)
Basophils Relative: 0 %
Eosinophils Absolute: 0.1 10*3/uL (ref 0.0–0.5)
Eosinophils Relative: 0 %
HCT: 34.4 % — ABNORMAL LOW (ref 39.0–52.0)
Hemoglobin: 11.6 g/dL — ABNORMAL LOW (ref 13.0–17.0)
Immature Granulocytes: 1 %
Lymphocytes Relative: 4 %
Lymphs Abs: 1 10*3/uL (ref 0.7–4.0)
MCH: 30.4 pg (ref 26.0–34.0)
MCHC: 33.7 g/dL (ref 30.0–36.0)
MCV: 90.1 fL (ref 80.0–100.0)
Monocytes Absolute: 0.9 10*3/uL (ref 0.1–1.0)
Monocytes Relative: 4 %
Neutro Abs: 21.4 10*3/uL — ABNORMAL HIGH (ref 1.7–7.7)
Neutrophils Relative %: 91 %
Platelets: 366 10*3/uL (ref 150–400)
RBC: 3.82 MIL/uL — ABNORMAL LOW (ref 4.22–5.81)
RDW: 14.2 % (ref 11.5–15.5)
WBC: 23.6 10*3/uL — ABNORMAL HIGH (ref 4.0–10.5)
nRBC: 0 % (ref 0.0–0.2)

## 2020-10-18 LAB — BASIC METABOLIC PANEL
Anion gap: 10 (ref 5–15)
BUN: 19 mg/dL (ref 8–23)
CO2: 25 mmol/L (ref 22–32)
Calcium: 8 mg/dL — ABNORMAL LOW (ref 8.9–10.3)
Chloride: 100 mmol/L (ref 98–111)
Creatinine, Ser: 0.7 mg/dL (ref 0.61–1.24)
GFR, Estimated: >60 mL/min (ref >60)
Glucose, Bld: 269 mg/dL — ABNORMAL HIGH (ref 70–99)
Potassium: 3.7 mmol/L (ref 3.5–5.1)
Sodium: 135 mmol/L (ref 135–145)

## 2020-10-18 LAB — RESP PANEL BY RT-PCR (FLU A&B, COVID) ARPGX2
Influenza A by PCR: NEGATIVE
Influenza B by PCR: NEGATIVE
SARS Coronavirus 2 by RT PCR: NEGATIVE

## 2020-10-18 LAB — GLUCOSE, CAPILLARY: Glucose-Capillary: 179 mg/dL — ABNORMAL HIGH (ref 70–99)

## 2020-10-18 MED ORDER — INSULIN ASPART 100 UNIT/ML ~~LOC~~ SOLN
0.0000 [IU] | Freq: Every day | SUBCUTANEOUS | Status: DC
  Administered 2020-10-21: 2 [IU] via SUBCUTANEOUS
  Administered 2020-10-23: 4 [IU] via SUBCUTANEOUS
  Administered 2020-10-24: 3 [IU] via SUBCUTANEOUS

## 2020-10-18 MED ORDER — ONDANSETRON HCL 4 MG PO TABS: 4.0000 mg | ORAL_TABLET | Freq: Four times a day (QID) | ORAL | Status: DC | PRN

## 2020-10-18 MED ORDER — MORPHINE SULFATE (PF) 4 MG/ML IV SOLN
4.0000 mg | Freq: Once | INTRAVENOUS | Status: AC
Start: 2020-10-18 — End: 2020-10-18
  Administered 2020-10-18: 4 mg via INTRAVENOUS
  Filled 2020-10-18: qty 1

## 2020-10-18 MED ORDER — ACETAMINOPHEN 325 MG PO TABS
650.0000 mg | ORAL_TABLET | Freq: Four times a day (QID) | ORAL | Status: DC | PRN
  Administered 2020-10-19 – 2020-10-24 (×5): 650 mg via ORAL
  Filled 2020-10-18 (×5): qty 2

## 2020-10-18 MED ORDER — PANCRELIPASE (LIP-PROT-AMYL) 12000-38000 UNITS PO CPEP
24000.0000 [IU] | ORAL_CAPSULE | Freq: Three times a day (TID) | ORAL | Status: DC
  Administered 2020-10-19 – 2020-10-28 (×21): 24000 [IU] via ORAL
  Filled 2020-10-18 (×22): qty 2

## 2020-10-18 MED ORDER — POTASSIUM CHLORIDE CRYS ER 20 MEQ PO TBCR
20.0000 meq | EXTENDED_RELEASE_TABLET | Freq: Every day | ORAL | Status: DC
  Administered 2020-10-19 – 2020-10-25 (×5): 20 meq via ORAL
  Filled 2020-10-18 (×6): qty 1

## 2020-10-18 MED ORDER — ROSUVASTATIN CALCIUM 20 MG PO TABS
20.0000 mg | ORAL_TABLET | Freq: Every day | ORAL | Status: DC
  Administered 2020-10-19 – 2020-10-28 (×9): 20 mg via ORAL
  Filled 2020-10-18 (×9): qty 1

## 2020-10-18 MED ORDER — AMLODIPINE BESYLATE 10 MG PO TABS
10.0000 mg | ORAL_TABLET | Freq: Every day | ORAL | Status: DC
  Administered 2020-10-19 – 2020-10-28 (×8): 10 mg via ORAL
  Filled 2020-10-18 (×9): qty 1

## 2020-10-18 MED ORDER — INSULIN DETEMIR 100 UNIT/ML ~~LOC~~ SOLN
15.0000 [IU] | Freq: Every day | SUBCUTANEOUS | Status: DC
  Administered 2020-10-19 (×2): 15 [IU] via SUBCUTANEOUS
  Filled 2020-10-18 (×2): qty 0.15

## 2020-10-18 MED ORDER — ONDANSETRON HCL 4 MG/2ML IJ SOLN
4.0000 mg | Freq: Once | INTRAMUSCULAR | Status: AC
  Administered 2020-10-18: 4 mg via INTRAVENOUS
  Filled 2020-10-18: qty 2

## 2020-10-18 MED ORDER — ONDANSETRON HCL 4 MG/2ML IJ SOLN: 4.0000 mg | Freq: Four times a day (QID) | INTRAMUSCULAR | Status: DC | PRN

## 2020-10-18 MED ORDER — INSULIN DETEMIR 100 UNIT/ML ~~LOC~~ SOLN
15.0000 [IU] | Freq: Every day | SUBCUTANEOUS | Status: DC
  Administered 2020-10-19 – 2020-10-20 (×2): 15 [IU] via SUBCUTANEOUS
  Filled 2020-10-18 (×3): qty 0.15

## 2020-10-18 MED ORDER — INSULIN ASPART 100 UNIT/ML ~~LOC~~ SOLN
0.0000 [IU] | Freq: Three times a day (TID) | SUBCUTANEOUS | Status: DC
  Administered 2020-10-19 (×2): 3 [IU] via SUBCUTANEOUS
  Administered 2020-10-19: 2 [IU] via SUBCUTANEOUS
  Administered 2020-10-20: 5 [IU] via SUBCUTANEOUS
  Administered 2020-10-20: 2 [IU] via SUBCUTANEOUS
  Administered 2020-10-21: 5 [IU] via SUBCUTANEOUS
  Administered 2020-10-21: 3 [IU] via SUBCUTANEOUS
  Administered 2020-10-21: 2 [IU] via SUBCUTANEOUS
  Administered 2020-10-22: 5 [IU] via SUBCUTANEOUS
  Administered 2020-10-22 – 2020-10-23 (×4): 8 [IU] via SUBCUTANEOUS
  Administered 2020-10-23: 3 [IU] via SUBCUTANEOUS
  Administered 2020-10-24: 5 [IU] via SUBCUTANEOUS
  Administered 2020-10-24: 11 [IU] via SUBCUTANEOUS
  Administered 2020-10-24: 5 [IU] via SUBCUTANEOUS
  Administered 2020-10-24: 15 [IU] via SUBCUTANEOUS
  Administered 2020-10-25: 8 [IU] via SUBCUTANEOUS

## 2020-10-18 MED ORDER — ACETAMINOPHEN 650 MG RE SUPP: 650.0000 mg | Freq: Four times a day (QID) | RECTAL | Status: DC | PRN

## 2020-10-18 MED ORDER — PANTOPRAZOLE SODIUM 40 MG PO TBEC
40.0000 mg | DELAYED_RELEASE_TABLET | Freq: Every day | ORAL | Status: DC
  Administered 2020-10-19 – 2020-10-28 (×9): 40 mg via ORAL
  Filled 2020-10-18 (×10): qty 1

## 2020-10-18 MED ORDER — ALBUTEROL SULFATE (2.5 MG/3ML) 0.083% IN NEBU: 2.5000 mg | INHALATION_SOLUTION | Freq: Four times a day (QID) | RESPIRATORY_TRACT | Status: DC | PRN

## 2020-10-18 MED ORDER — OXYCODONE HCL 5 MG PO TABS
5.0000 mg | ORAL_TABLET | ORAL | Status: DC | PRN
  Administered 2020-10-18 – 2020-10-28 (×21): 5 mg via ORAL
  Filled 2020-10-18 (×21): qty 1

## 2020-10-18 NOTE — ED Triage Notes (Signed)
Pt BIB EMS from [facility] parking lot. Pt lost his balance and fell to the ground. Pt now endorses right hip pain. Denies LOC, hitting his head, and blood thinners.

## 2020-10-18 NOTE — ED Provider Notes (Signed)
[facility] DEPT Provider Note   CSN: [id number] Arrival date & time: 10/18/20  1740     History Chief Complaint  Patient presents with  . Fall    Colton Bush is a 65 y.o. male.  Patient presents to ER chief complaint of right hip pain.  He states that he was out side in the supermarket parking lot with his groceries when he felt he was stepping over his groceries.  He try to regain his balance but ended up falling onto his right side.  Denies head injury or loss of consciousness, denies pain elsewhere but complaining of right hip pain unable to stand up afterwards.  No recent illnesses no fever no cough no vomiting no diarrhea.        Past Medical History:  Diagnosis Date  . Arthritis    left hand  . Bronchitis 1977  . Cancer ([place]) 03/09/2016   pancreatic cancer, sees Dr. Cristino Martes at [facility]   . Depression    takes Cymbalta daily  . Diabetes mellitus type II    sees Dr. Chalmers Cater   . GERD (gastroesophageal reflux disease)    takes Omeprazole daily  . H/O hiatal hernia   . Hyperlipidemia    takes Zocor daily  . Hypertension    takes Amlodipine daily  . Hypoglycemia 06/18/2017  . Neck pain    C4-7 stenosis and herniated disc  . Neuromuscular disorder ([place])    hiatal hernia  . Scoliosis    slight  . Spinal cord injury, C5-C7 ([place])    c4-c7  . Stiffness of hand joint    d/t cervical issues    Patient Active Problem List   Diagnosis Date Noted  . Closed right hip fracture, initial encounter ([place]) 10/18/2020  . Hypoglycemia 06/19/2017  . Hypothermia 06/19/2017  . Goals of care, counseling/discussion 09/29/2016  . Port catheter in place 04/11/2016  . Hypercalcemia 03/29/2016  . Adenocarcinoma of head of pancreas ([place]) 03/17/2016  . Biliary obstruction   . Obstructive jaundice due to malignant neoplasm ([place]) 02/27/2016  . DKA (diabetic ketoacidoses) 02/27/2016  . Diabetes mellitus type 2, uncontrolled, without complications  76/28/3151  . Cervical spondylosis with myelopathy 08/18/2011  . CERUMEN IMPACTION 12/14/2008  . Hyperlipidemia, mixed 09/16/2007  . Essential hypertension 09/16/2007  . GERD 09/16/2007  . ESOPHAGEAL STRICTURE 04/04/2007  . HIATAL HERNIA 04/04/2007    Past Surgical History:  Procedure Laterality Date  . ANTERIOR CERVICAL DECOMP/DISCECTOMY FUSION  08/18/2011   Procedure: ANTERIOR CERVICAL DECOMPRESSION/DISCECTOMY FUSION 3 LEVELS;  Surgeon: Winfield Cunas, MD;  Location: [facility];  Service: Neurosurgery;  Laterality: N/A;  Anterior Cervical Four-Five/Five-Six/Six-Seven Decompression with Fusion, Plating, and Bonegraft  . CARPAL TUNNEL RELEASE  2013   bilateral, per Dr. Christella Noa   . COLONOSCOPY  10-30-14   per Dr. Olevia Perches, clear, repeat in 10 yrs   . egd with esophageal dilation  9-08   per Dr. Olevia Perches  . ERCP N/A 03/01/2016   Procedure: ENDOSCOPIC RETROGRADE CHOLANGIOPANCREATOGRAPHY (ERCP) with brushings and stent;  Surgeon: Doran Stabler, MD;  Location: [facility];  Service: Endoscopy;  Laterality: N/A;  . ESOPHAGOGASTRODUODENOSCOPY (EGD) WITH PROPOFOL N/A 05/27/2020   Procedure: ESOPHAGOGASTRODUODENOSCOPY (EGD) WITH PROPOFOL;  Surgeon: Milus Banister, MD;  Location: [facility];  Service: Endoscopy;  Laterality: N/A;  . EUS N/A 03/09/2016   Procedure: ESOPHAGEAL ENDOSCOPIC ULTRASOUND (EUS) RADIAL;  Surgeon: Milus Banister, MD;  Location: [facility];  Service: Endoscopy;  Laterality: N/A;  .  EUS N/A 05/27/2020   Procedure: UPPER ENDOSCOPIC ULTRASOUND (EUS) RADIAL;  Surgeon: Milus Banister, MD;  Location: [facility];  Service: Endoscopy;  Laterality: N/A;  . lymph nodes biopsy    . melanoma rt calf  1999  . PORT-A-CATH REMOVAL N/A 04/03/2019   Procedure: PORT REMOVAL;  Surgeon: Stark Klein, MD;  Location: [facility];  Service: General;  Laterality: N/A;  . PORTACATH PLACEMENT Left 03/22/2016   Procedure: INSERTION PORT-A-CATH;  Surgeon: Stark Klein, MD;  Location: [facility];   Service: General;  Laterality: Left;  . SPINE SURGERY    . TONSILLECTOMY     as a child  . ULNAR TUNNEL RELEASE  2013   right arm, per Dr. Christella Noa   . UPPER GASTROINTESTINAL ENDOSCOPY    . WHIPPLE PROCEDURE N/A 09/19/2016   Procedure: DIAGNOSTIC LAPAROSCOPY, LAPAROSCOPIC LIVER BIOPSY, RETROPERITONEAL EXPLORATION, INTRAOPERATIVE ULTRASOUND;  Surgeon: Stark Klein, MD;  Location: [facility];  Service: General;  Laterality: N/A;       Family History  Problem Relation Age of Onset  . Heart disease Father   . Heart disease Brother 42  . Anesthesia problems Mother   . Heart disease Mother   . Dementia Mother   . Diabetes Sister   . Stroke Sister   . Colon cancer Neg Hx   . Rectal cancer Neg Hx   . Stomach cancer Neg Hx     Social History   Tobacco Use  . Smoking status: Never Smoker  . Smokeless tobacco: Never Used  . Tobacco comment: tried a pipe 35 years ago   Vaping Use  . Vaping Use: Never used  Substance Use Topics  . Alcohol use: No    Alcohol/week: 0.0 standard drinks  . Drug use: No    Home Medications Prior to Admission medications   Medication Sig Start Date End Date Taking? Authorizing Provider  acetaminophen (TYLENOL) 500 MG tablet Take 1,000 mg by mouth every 6 (six) hours as needed for moderate pain or headache.    [provider]  amLODipine (NORVASC) 10 MG tablet Take 1 tablet (10 mg total) by mouth daily. 01/13/20   [name], MD  aspirin EC 81 MG tablet Take 81 mg by mouth daily. Swallow whole.    [provider]  B-D UF III MINI PEN NEEDLES 31G X 5 MM MISC Inject into the skin. 07/12/20   [provider]  benazepril (LOTENSIN) 10 MG tablet TAKE 1 TABLET DAILY 10/04/20   [name], MD  CREON 6000-19000 units CPEP Take 1 capsule by mouth with breakfast, with lunch, and with evening meal.  04/15/20   [provider]  diphenhydrAMINE HCl, Sleep, 25 MG TBDP Take 25 mg by mouth at bedtime as needed (sleep).    [provider]  ferrous sulfate 325 (65 FE) MG tablet Take 325 mg daily with breakfast by mouth.    [provider]  FREESTYLE LITE test strip USE ONE STRIP TO CHECK GLUCOSE ONCE DAILY. PLEASE  SCHEDULE FOLLOW UP 11/09/16   [name], MD  Insulin Detemir (LEVEMIR) 100 UNIT/ML Pen Inject 15 Units into the skin daily at 10 pm. Patient taking differently: Inject 20-25 Units into the skin See admin instructions. Inject 25 units in the morning and 20 units at night 06/20/17   Nita Sells, MD  Insulin Pen Needle 30G X 5 MM MISC Use one daily with insulin 03/04/16   Copland, Gay Filler, MD  Lancets (FREESTYLE) lancets USE AS INSTRUCTED TO  CHECK BLOOD SUGAR ONCE A DAY 06/02/16   [name], MD  loratadine (CLARITIN) 10 MG tablet Take 10 mg by mouth daily.    [provider]  Multiple Vitamin (MULTIVITAMIN WITH MINERALS) TABS tablet Take 1 tablet by mouth daily.    [provider]  naproxen sodium (ANAPROX) 220 MG tablet Take 440 mg by mouth 2 (two) times daily as needed (for pain.).    [provider]  neomycin-polymyxin-hydrocortisone (CORTISPORIN) OTIC solution Apply 1-2 drops to toe after soaking BID 01/14/20   Regal, Tamala Fothergill, DPM  NOVOLOG FLEXPEN 100 UNIT/ML FlexPen GIVE EVERY MORNING WITH BREAKFAST AND EVERY EVENING WITH SUPPER PER SLIDING SCALE Patient taking differently: Inject 3-12 Units into the skin 3 (three) times daily with meals. 04/17/17   [name], MD  Omega-3 Fatty Acids (FISH OIL) 500 MG CAPS Take 500 mg by mouth daily.    [provider]  omeprazole (PRILOSEC) 20 MG capsule TAKE 1 CAPSULE DAILY Patient taking differently: Take 20 mg by mouth daily. 12/02/19   [name], MD  potassium chloride SA (KLOR-CON M20) 20 MEQ tablet Take 1 tablet (20 mEq total) by mouth daily. 07/22/20   [name], MD  rosuvastatin (CRESTOR) 20 MG tablet Take 1 tablet (20 mg total) by mouth daily. 07/22/20   [name], MD    Allergies     Patient has no known allergies.  Review of Systems   Review of Systems  Constitutional: Negative for fever.  HENT: Negative for ear pain and sore throat.   Eyes: Negative for pain.  Respiratory: Negative for cough.   Cardiovascular: Negative for chest pain.  Gastrointestinal: Negative for abdominal pain.  Genitourinary: Negative for flank pain.  Musculoskeletal: Negative for back pain.  Skin: Negative for color change and rash.  Neurological: Negative for syncope.  All other systems reviewed and are negative.   Physical Exam Updated Vital Signs BP 138/78   Pulse 86   Temp 98.1 F (36.7 C) (Oral)   Resp 18   SpO2 95%   Physical Exam Constitutional:      General: He is not in acute distress.    Appearance: He is well-developed.  HENT:     Head: Normocephalic and atraumatic.     Nose: Nose normal.  Eyes:     Extraocular Movements: Extraocular movements intact.  Cardiovascular:     Rate and Rhythm: Normal rate.  Pulmonary:     Effort: Pulmonary effort is normal.  Musculoskeletal:     Comments: Pain with any attempted range of motion of the right hip.  Neurovascular tact distally otherwise.  No C or T or L-spine tenderness or step-offs noted.  Skin:    Coloration: Skin is not jaundiced.  Neurological:     Mental Status: He is alert. Mental status is at baseline.     ED Results / Procedures / Treatments   Labs (all labs ordered are listed, but only abnormal results are displayed) Labs Reviewed  CBC WITH DIFFERENTIAL/PLATELET - Abnormal; Notable for the following components:      Result Value   WBC 23.6 (*)    RBC 3.82 (*)    Hemoglobin 11.6 (*)    HCT 34.4 (*)    Neutro Abs 21.4 (*)    Abs Immature Granulocytes 0.19 (*)    All other components within normal limits  BASIC METABOLIC PANEL - Abnormal; Notable for the following components:   Glucose, Bld 269 (*)    Calcium 8.0 (*)  All other components within normal limits  RESP PANEL BY RT-PCR (FLU A&B,  COVID) ARPGX2  HEMOGLOBIN A1C  HIV ANTIBODY (ROUTINE TESTING W REFLEX)  CBC  COMPREHENSIVE METABOLIC PANEL  PROTIME-INR  MAGNESIUM  PHOSPHORUS    EKG None  Radiology DG Chest 1 View  Result Date: 10/18/2020 CLINICAL DATA:  Fall, pain and RIGHT hip. EXAM: CHEST  1 VIEW COMPARISON:  June 19, 2017 FINDINGS: Trachea midline. Cardiomediastinal contours and hilar structures are normal. Signs of atelectasis and scarring at the lung bases. Evidence of cervical spinal fusion partially imaged on the current study. On limited assessment no acute skeletal process. IMPRESSION: Signs of atelectasis and scarring at the lung bases. Electronically Signed   By: Zetta Bills M.D.   On: 10/18/2020 19:12   DG Hip Unilat With Pelvis 2-3 Views Right  Result Date: 10/18/2020 CLINICAL DATA:  Fall, hip pain status post fall EXAM: DG HIP (WITH OR WITHOUT PELVIS) 2-3V RIGHT COMPARISON:  Prior CT imaging of the chest, abdomen and pelvis. FINDINGS: No fracture of the bony pelvis.  Spinal degenerative changes. Comminuted RIGHT proximal femoral fracture, intratrochanteric type with distraction of the lesser trochanter. Mild displacement of distal femur relative to proximal femur, slight anterior displacement 2-3 mm. Mild to moderate varus angulation at the fracture site. Sclerotic lesion in the LEFT iliac bone unchanged likely large bone island. IMPRESSION: Comminuted mildly angulated RIGHT intratrochanteric fracture. Electronically Signed   By: Zetta Bills M.D.   On: 10/18/2020 19:17    Procedures Procedures   Medications Ordered in ED Medications  insulin aspart (novoLOG) injection 0-15 Units (has no administration in time range)  insulin aspart (novoLOG) injection 0-5 Units (has no administration in time range)  acetaminophen (TYLENOL) tablet 650 mg (has no administration in time range)    Or  acetaminophen (TYLENOL) suppository 650 mg (has no administration in time range)  ondansetron (ZOFRAN) tablet 4  mg (has no administration in time range)    Or  ondansetron (ZOFRAN) injection 4 mg (has no administration in time range)  oxyCODONE (Oxy IR/ROXICODONE) immediate release tablet 5 mg (has no administration in time range)  albuterol (PROVENTIL) (2.5 MG/3ML) 0.083% nebulizer solution 2.5 mg (has no administration in time range)  morphine 4 MG/ML injection 4 mg (4 mg Intravenous Given 10/18/20 1912)  ondansetron (ZOFRAN) injection 4 mg (4 mg Intravenous Given 10/18/20 1913)    ED Course  I have reviewed the triage vital signs and the nursing notes.  Pertinent labs & imaging results that were available during my care of the patient were reviewed by me and considered in my medical decision making (see chart for details).    MDM Rules/Calculators/A&P                          Labs show leukocytosis likely due to extravasation and his fracture.  X-rays consistent with right intertrochanteric fracture.  Case discussed with orthopedics who plans on surgical intervention tomorrow.  Patient to remain n.p.o. after midnight.  Given IV morphine for pain management.  Medical consultation for admission.  Final Clinical Impression(s) / ED Diagnoses Final diagnoses:  Closed fracture of right hip, initial encounter [facility])    Rx / [place] Orders ED Discharge Orders    None       Luna Fuse, MD 10/18/20 2225

## 2020-10-18 NOTE — H&P (Signed)
History and Physical  Patient Name: Colton Bush     BPZ:[id number]    DOB: 1955/08/31    DOA: 10/18/2020 PCP: [name], MD  Patient coming from: [place] store parking lot  Chief Complaint: S/P GLF, right hip pain, unable to bear weight    HPI: Colton Bush is a 65 y.o. male, with PMH of recurrent pancreatic cancer, insulin-dependent diabetes, hypertension, dyslipidemia who presented to the ER on on 10/18/2020 with inability to walk after ground-level fall and was found to have right hip fracture.  Patient states he was in a grocery store parking lot, putting away groceries in his car when he tried to make a quick reaction as a grocery cart was moving but lost his balance and fell on his right side.  He did not hit his head.  He could not get up but with assistance of others he was able to get up however could not place any weight on his right leg.  Due to these concerning findings, he was brought into the ER.  He denies any history of recurrent falls but over the past few months he has noticed he has been weaker.  He recently found out that his pancreatic cancer is back and he is in the midst of returning the months best steps with his oncologist.    ED course: -Vitals on admission: Afebrile, heart rate 83, respiratory rate 18, blood pressure 140/87 -Labs on initial presentation: Sodium 135, potassium 3.7, chloride 100, bicarb 25, glucose 296, BUN 19, creatinine 0.7, calcium 8, WBC 23.6, hemoglobin 11.6, Covid negative -Imaging obtained on admission: X-ray of the hips demonstrate right intertrochanter fracture -In the ED the patient was given morphine and Zofran.  Orthopedics contacted and recommended n.p.o. for possible surgery tomorrow. the hospitalist service was contacted for admission.     ROS: A complete and thorough 12 point review of systems obtained, negative listed in HPI.     Past Medical History:  Diagnosis Date  . Arthritis    left hand  . Bronchitis 1977  .  Cancer ([place]) 03/09/2016   pancreatic cancer, sees Dr. Cristino Martes at [facility]   . Depression    takes Cymbalta daily  . Diabetes mellitus type II    sees Dr. Chalmers Cater   . GERD (gastroesophageal reflux disease)    takes Omeprazole daily  . H/O hiatal hernia   . Hyperlipidemia    takes Zocor daily  . Hypertension    takes Amlodipine daily  . Hypoglycemia 06/18/2017  . Neck pain    C4-7 stenosis and herniated disc  . Neuromuscular disorder ([place])    hiatal hernia  . Scoliosis    slight  . Spinal cord injury, C5-C7 ([place])    c4-c7  . Stiffness of hand joint    d/t cervical issues    Past Surgical History:  Procedure Laterality Date  . ANTERIOR CERVICAL DECOMP/DISCECTOMY FUSION  08/18/2011   Procedure: ANTERIOR CERVICAL DECOMPRESSION/DISCECTOMY FUSION 3 LEVELS;  Surgeon: Winfield Cunas, MD;  Location: [facility];  Service: Neurosurgery;  Laterality: N/A;  Anterior Cervical Four-Five/Five-Six/Six-Seven Decompression with Fusion, Plating, and Bonegraft  . CARPAL TUNNEL RELEASE  2013   bilateral, per Dr. Christella Noa   . COLONOSCOPY  10-30-14   per Dr. Olevia Perches, clear, repeat in 10 yrs   . egd with esophageal dilation  9-08   per Dr. Olevia Perches  . ERCP N/A 03/01/2016   Procedure: ENDOSCOPIC RETROGRADE CHOLANGIOPANCREATOGRAPHY (ERCP) with brushings and stent;  Surgeon: Estill Cotta  Dorothea Glassman, MD;  Location: [facility];  Service: Endoscopy;  Laterality: N/A;  . ESOPHAGOGASTRODUODENOSCOPY (EGD) WITH PROPOFOL N/A 05/27/2020   Procedure: ESOPHAGOGASTRODUODENOSCOPY (EGD) WITH PROPOFOL;  Surgeon: Milus Banister, MD;  Location: [facility];  Service: Endoscopy;  Laterality: N/A;  . EUS N/A 03/09/2016   Procedure: ESOPHAGEAL ENDOSCOPIC ULTRASOUND (EUS) RADIAL;  Surgeon: Milus Banister, MD;  Location: [facility];  Service: Endoscopy;  Laterality: N/A;  . EUS N/A 05/27/2020   Procedure: UPPER ENDOSCOPIC ULTRASOUND (EUS) RADIAL;  Surgeon: Milus Banister, MD;  Location: [facility];  Service: Endoscopy;   Laterality: N/A;  . lymph nodes biopsy    . melanoma rt calf  1999  . PORT-A-CATH REMOVAL N/A 04/03/2019   Procedure: PORT REMOVAL;  Surgeon: Stark Klein, MD;  Location: [facility];  Service: General;  Laterality: N/A;  . PORTACATH PLACEMENT Left 03/22/2016   Procedure: INSERTION PORT-A-CATH;  Surgeon: Stark Klein, MD;  Location: [facility];  Service: General;  Laterality: Left;  . SPINE SURGERY    . TONSILLECTOMY     as a child  . ULNAR TUNNEL RELEASE  2013   right arm, per Dr. Christella Noa   . UPPER GASTROINTESTINAL ENDOSCOPY    . WHIPPLE PROCEDURE N/A 09/19/2016   Procedure: DIAGNOSTIC LAPAROSCOPY, LAPAROSCOPIC LIVER BIOPSY, RETROPERITONEAL EXPLORATION, INTRAOPERATIVE ULTRASOUND;  Surgeon: Stark Klein, MD;  Location: [facility];  Service: General;  Laterality: N/A;    Social History: Patient lives at home.  The patient walks without assistance.  Non smoker.  No Known Allergies  Family history: family history includes Anesthesia problems in his mother; Dementia in his mother; Diabetes in his sister; Heart disease in his father and mother; Heart disease (age of onset: 16) in his brother; Stroke in his sister.  Prior to Admission medications   Medication Sig Start Date End Date Taking? Authorizing Provider  acetaminophen (TYLENOL) 500 MG tablet Take 1,000 mg by mouth every 6 (six) hours as needed for moderate pain or headache.    [provider]  amLODipine (NORVASC) 10 MG tablet Take 1 tablet (10 mg total) by mouth daily. 01/13/20   [name], MD  aspirin EC 81 MG tablet Take 81 mg by mouth daily. Swallow whole.    [provider]  B-D UF III MINI PEN NEEDLES 31G X 5 MM MISC Inject into the skin. 07/12/20   [provider]  benazepril (LOTENSIN) 10 MG tablet TAKE 1 TABLET DAILY 10/04/20   [name], MD  CREON 6000-19000 units CPEP Take 1 capsule by mouth with breakfast, with lunch, and with evening meal.  04/15/20   [provider]  diphenhydrAMINE HCl, Sleep,  25 MG TBDP Take 25 mg by mouth at bedtime as needed (sleep).    [provider]  ferrous sulfate 325 (65 FE) MG tablet Take 325 mg daily with breakfast by mouth.    [provider]  FREESTYLE LITE test strip USE ONE STRIP TO CHECK GLUCOSE ONCE DAILY. PLEASE  SCHEDULE FOLLOW UP 11/09/16   [name], MD  Insulin Detemir (LEVEMIR) 100 UNIT/ML Pen Inject 15 Units into the skin daily at 10 pm. Patient taking differently: Inject 20-25 Units into the skin See admin instructions. Inject 25 units in the morning and 20 units at night 06/20/17   Nita Sells, MD  Insulin Pen Needle 30G X 5 MM MISC Use one daily with insulin 03/04/16   Copland, Gay Filler, MD  Lancets (FREESTYLE) lancets USE AS INSTRUCTED TO CHECK BLOOD SUGAR ONCE  A DAY 06/02/16   [name], MD  loratadine (CLARITIN) 10 MG tablet Take 10 mg by mouth daily.    [provider]  Multiple Vitamin (MULTIVITAMIN WITH MINERALS) TABS tablet Take 1 tablet by mouth daily.    [provider]  naproxen sodium (ANAPROX) 220 MG tablet Take 440 mg by mouth 2 (two) times daily as needed (for pain.).    [provider]  neomycin-polymyxin-hydrocortisone (CORTISPORIN) OTIC solution Apply 1-2 drops to toe after soaking BID 01/14/20   Regal, Tamala Fothergill, DPM  NOVOLOG FLEXPEN 100 UNIT/ML FlexPen GIVE EVERY MORNING WITH BREAKFAST AND EVERY EVENING WITH SUPPER PER SLIDING SCALE Patient taking differently: Inject 3-12 Units into the skin 3 (three) times daily with meals. 04/17/17   [name], MD  Omega-3 Fatty Acids (FISH OIL) 500 MG CAPS Take 500 mg by mouth daily.    [provider]  omeprazole (PRILOSEC) 20 MG capsule TAKE 1 CAPSULE DAILY Patient taking differently: Take 20 mg by mouth daily. 12/02/19   [name], MD  potassium chloride SA (KLOR-CON M20) 20 MEQ tablet Take 1 tablet (20 mEq total) by mouth daily. 07/22/20   [name], MD  rosuvastatin (CRESTOR) 20 MG tablet Take 1 tablet (20 mg  total) by mouth daily. 07/22/20   [name], MD       Physical Exam: BP 140/87 (BP Location: Right Arm)   Pulse 83   Temp 98.5 F (36.9 C) (Oral)   Resp 18   SpO2 98%   General appearance: Well-developed, adult male, alert and in no acute distress .   Eyes: Anicteric, conjunctiva pink, lids and lashes normal. PERRL.    ENT: No nasal deformity, discharge, epistaxis.  Hearing intact. OP moist without lesions.   Neck: No neck masses.  Trachea midline.  No thyromegaly/tenderness. Lymph: No cervical or supraclavicular lymphadenopathy. Skin: Warm and dry.  No jaundice.  No suspicious rashes or lesions. Cardiac: RRR, nl S1-S2, no murmurs appreciated.  trace LE edema.  Radial and pedal pulses 2+ and symmetric. Respiratory: Normal respiratory rate and rhythm.  CTAB without rales or wheezes. Abdomen: Abdomen soft.  No tenderness with palpation. No ascites, distension, hepatosplenomegaly.   MSK:  Moves upper extremities without difficulty.  Did not attempt to move right hip.Marland Kitchen  No cyanosis or clubbing. Neuro: Cranial nerves 2 through 12 grossly intact.  Sensation intact to light touch. Speech is fluent.  Marland Kitchen    Psych: Sensorium intact and responding to questions, attention normal.  Behavior appropriate.  Judgment and insight appear normal.    Labs on Admission:  I have personally reviewed following labs and imaging studies: CBC: Recent Labs  Lab 10/18/20 1911  WBC 23.6*  NEUTROABS 21.4*  HGB 11.6*  HCT 34.4*  MCV 90.1  PLT 947   Basic Metabolic Panel: Recent Labs  Lab 10/18/20 1911  NA 135  K 3.7  CL 100  CO2 25  GLUCOSE 269*  BUN 19  CREATININE 0.70  CALCIUM 8.0*   GFR: Estimated Creatinine Clearance: 92.5 mL/min (by C-G formula based on SCr of 0.7 mg/dL).  Liver Function Tests: No results for input(s): AST, ALT, ALKPHOS, BILITOT, PROT, ALBUMIN in the last 168 hours. No results for input(s): LIPASE, AMYLASE in the last 168 hours. No results for input(s): AMMONIA in  the last 168 hours. Coagulation Profile: No results for input(s): INR, PROTIME in the last 168 hours. Cardiac Enzymes: No results for input(s): CKTOTAL, CKMB, CKMBINDEX, TROPONINI in the last 168  hours. BNP (last 3 results) No results for input(s): PROBNP in the last 8760 hours. HbA1C: No results for input(s): HGBA1C in the last 72 hours. CBG: No results for input(s): GLUCAP in the last 168 hours. Lipid Profile: No results for input(s): CHOL, HDL, LDLCALC, TRIG, CHOLHDL, LDLDIRECT in the last 72 hours. Thyroid Function Tests: No results for input(s): TSH, T4TOTAL, FREET4, T3FREE, THYROIDAB in the last 72 hours. Anemia Panel: No results for input(s): VITAMINB12, FOLATE, FERRITIN, TIBC, IRON, RETICCTPCT in the last 72 hours.   Recent Results (from the past 240 hour(s))  Resp Panel by RT-PCR (Flu A&B, Covid) Nasopharyngeal Swab     Status: None   Collection Time: 10/18/20  7:11 PM   Specimen: Nasopharyngeal Swab; Nasopharyngeal(NP) swabs in vial transport medium  Result Value Ref Range Status   SARS Coronavirus 2 by RT PCR NEGATIVE NEGATIVE Final    Comment: (NOTE) SARS-CoV-2 target nucleic acids are NOT DETECTED.  The SARS-CoV-2 RNA is generally detectable in upper respiratory specimens during the acute phase of infection. The lowest concentration of SARS-CoV-2 viral copies this assay can detect is 138 copies/mL. A negative result does not preclude SARS-Cov-2 infection and should not be used as the sole basis for treatment or other patient management decisions. A negative result may occur with  improper specimen collection/handling, submission of specimen other than nasopharyngeal swab, presence of viral mutation(s) within the areas targeted by this assay, and inadequate number of viral copies(<138 copies/mL). A negative result must be combined with clinical observations, patient history, and epidemiological information. The expected result is Negative.  Fact Sheet for  Patients:  EntrepreneurPulse.com.au  Fact Sheet for Healthcare Providers:  IncredibleEmployment.be  This test is no t yet approved or cleared by the [place] FDA and  has been authorized for detection and/or diagnosis of SARS-CoV-2 by FDA under an Emergency Use Authorization (EUA). This EUA will remain  in effect (meaning this test can be used) for the duration of the COVID-19 declaration under Section 564(b)(1) of the Act, 21 U.S.C.section 360bbb-3(b)(1), unless the authorization is terminated  or revoked sooner.       Influenza A by PCR NEGATIVE NEGATIVE Final   Influenza B by PCR NEGATIVE NEGATIVE Final    Comment: (NOTE) The Xpert Xpress SARS-CoV-2/FLU/RSV plus assay is intended as an aid in the diagnosis of influenza from Nasopharyngeal swab specimens and should not be used as a sole basis for treatment. Nasal washings and aspirates are unacceptable for Xpert Xpress SARS-CoV-2/FLU/RSV testing.  Fact Sheet for Patients: EntrepreneurPulse.com.au  Fact Sheet for Healthcare Providers: IncredibleEmployment.be  This test is not yet approved or cleared by the [place] FDA and has been authorized for detection and/or diagnosis of SARS-CoV-2 by FDA under an Emergency Use Authorization (EUA). This EUA will remain in effect (meaning this test can be used) for the duration of the COVID-19 declaration under Section 564(b)(1) of the Act, 21 U.S.C. section 360bbb-3(b)(1), unless the authorization is terminated or revoked.  Performed at [facility], [place] [address] [place], [place] [address]            Radiological Exams on Admission: Personally reviewed imaging which shows: Right hip fracture DG Chest 1 View  Result Date: 10/18/2020 CLINICAL DATA:  Fall, pain and RIGHT hip. EXAM: CHEST  1 VIEW COMPARISON:  June 19, 2017 FINDINGS: Trachea midline. Cardiomediastinal contours  and hilar structures are normal. Signs of atelectasis and scarring at the lung bases. Evidence of cervical spinal fusion partially imaged on the  current study. On limited assessment no acute skeletal process. IMPRESSION: Signs of atelectasis and scarring at the lung bases. Electronically Signed   By: Zetta Bills M.D.   On: 10/18/2020 19:12   DG Hip Unilat With Pelvis 2-3 Views Right  Result Date: 10/18/2020 CLINICAL DATA:  Fall, hip pain status post fall EXAM: DG HIP (WITH OR WITHOUT PELVIS) 2-3V RIGHT COMPARISON:  Prior CT imaging of the chest, abdomen and pelvis. FINDINGS: No fracture of the bony pelvis.  Spinal degenerative changes. Comminuted RIGHT proximal femoral fracture, intratrochanteric type with distraction of the lesser trochanter. Mild displacement of distal femur relative to proximal femur, slight anterior displacement 2-3 mm. Mild to moderate varus angulation at the fracture site. Sclerotic lesion in the LEFT iliac bone unchanged likely large bone island. IMPRESSION: Comminuted mildly angulated RIGHT intratrochanteric fracture. Electronically Signed   By: Zetta Bills M.D.   On: 10/18/2020 19:17        Assessment/Plan   1.  Right hip fracture -Imaging shows right comminuted mildly angulated intertrochanter fracture -Orthopedics consulted in the ED, plan for surgery tomorrow -N.p.o. after midnight -Fall precautions -PT and OT consulted -Pain control as warranted  2.  Mechanical fall from ground-level -See further plans above  3.  Insulin-dependent diabetes -At home on Levemir 35 units in the morning 25 units at night, along with sliding scale -Decreased Levemir to 15 units BID given n.p.o. status -Sliding scale, glucose checks  4.  Recurrent pancreatic cancer -Recently discovered recurrence of pancreatic cancer -He is scheduled to see his oncologist on Wednesday.  Patient does not want to miss appointment stable.  5.  Essential hypertension -Continue home  Norvasc -Hold home ACE inhibitor for pending surgery to help mitigate AKI rest  6.  Dyslipidemia -Continue home statin     DVT prophylaxis: SCDs given upcoming surgery Code Status: Full Family Communication: Patient himself Disposition Plan: Anticipate discharge home versus rehab when medically optimized Consults called: Orthopedics contacted by ER Admission status: Inpatient    Medical decision making: Patient seen at 9:54 PM on 10/18/2020.  The patient was discussed with ER provider.  What exists of the patient's chart was reviewed in depth and summarized above.  Clinical condition: Stable.        Doran Heater [facility] Please page though [place] or Epic secure chat:  For password, contact charge nurse

## 2020-10-19 ENCOUNTER — Inpatient Hospital Stay (HOSPITAL_COMMUNITY): Payer: Medicare Other

## 2020-10-19 ENCOUNTER — Telehealth: Payer: Self-pay | Admitting: Radiation Oncology

## 2020-10-19 DIAGNOSIS — W1830XA Fall on same level, unspecified, initial encounter: Secondary | ICD-10-CM | POA: Diagnosis not present

## 2020-10-19 DIAGNOSIS — C250 Malignant neoplasm of head of pancreas: Secondary | ICD-10-CM | POA: Diagnosis not present

## 2020-10-19 DIAGNOSIS — I10 Essential (primary) hypertension: Secondary | ICD-10-CM | POA: Diagnosis not present

## 2020-10-19 DIAGNOSIS — S72001A Fracture of unspecified part of neck of right femur, initial encounter for closed fracture: Secondary | ICD-10-CM | POA: Diagnosis not present

## 2020-10-19 LAB — HEMOGLOBIN A1C
Hgb A1c MFr Bld: 10.8 % — ABNORMAL HIGH (ref 4.8–5.6)
Mean Plasma Glucose: 263.26 mg/dL

## 2020-10-19 LAB — HEPATIC FUNCTION PANEL
ALT: 30 U/L (ref 0–44)
AST: 22 U/L (ref 15–41)
Albumin: 2.3 g/dL — ABNORMAL LOW (ref 3.5–5.0)
Alkaline Phosphatase: 270 U/L — ABNORMAL HIGH (ref 38–126)
Bilirubin, Direct: 0.1 mg/dL (ref 0.0–0.2)
Indirect Bilirubin: 0.8 mg/dL (ref 0.3–0.9)
Total Bilirubin: 0.9 mg/dL (ref 0.3–1.2)
Total Protein: 6.8 g/dL (ref 6.5–8.1)

## 2020-10-19 LAB — COMPREHENSIVE METABOLIC PANEL
ALT: 31 U/L (ref 0–44)
AST: 21 U/L (ref 15–41)
Albumin: 2.4 g/dL — ABNORMAL LOW (ref 3.5–5.0)
Alkaline Phosphatase: 276 U/L — ABNORMAL HIGH (ref 38–126)
Anion gap: 6 (ref 5–15)
BUN: 14 mg/dL (ref 8–23)
CO2: 27 mmol/L (ref 22–32)
Calcium: 7.9 mg/dL — ABNORMAL LOW (ref 8.9–10.3)
Chloride: 102 mmol/L (ref 98–111)
Creatinine, Ser: 0.53 mg/dL — ABNORMAL LOW (ref 0.61–1.24)
GFR, Estimated: >60 mL/min (ref >60)
Glucose, Bld: 216 mg/dL — ABNORMAL HIGH (ref 70–99)
Potassium: 3.4 mmol/L — ABNORMAL LOW (ref 3.5–5.1)
Sodium: 135 mmol/L (ref 135–145)
Total Bilirubin: 1 mg/dL (ref 0.3–1.2)
Total Protein: 6.8 g/dL (ref 6.5–8.1)

## 2020-10-19 LAB — GLUCOSE, CAPILLARY
Glucose-Capillary: 167 mg/dL — ABNORMAL HIGH (ref 70–99)
Glucose-Capillary: 182 mg/dL — ABNORMAL HIGH (ref 70–99)
Glucose-Capillary: 139 mg/dL — ABNORMAL HIGH (ref 70–99)
Glucose-Capillary: 159 mg/dL — ABNORMAL HIGH (ref 70–99)

## 2020-10-19 LAB — CBC
HCT: 31.3 % — ABNORMAL LOW (ref 39.0–52.0)
Hemoglobin: 10.7 g/dL — ABNORMAL LOW (ref 13.0–17.0)
MCH: 30.2 pg (ref 26.0–34.0)
MCHC: 34.2 g/dL (ref 30.0–36.0)
MCV: 88.4 fL (ref 80.0–100.0)
Platelets: 373 10*3/uL (ref 150–400)
RBC: 3.54 MIL/uL — ABNORMAL LOW (ref 4.22–5.81)
RDW: 13.8 % (ref 11.5–15.5)
WBC: 14.7 10*3/uL — ABNORMAL HIGH (ref 4.0–10.5)
nRBC: 0 % (ref 0.0–0.2)

## 2020-10-19 LAB — MAGNESIUM: Magnesium: 2.1 mg/dL (ref 1.7–2.4)

## 2020-10-19 LAB — VITAMIN D 25 HYDROXY (VIT D DEFICIENCY, FRACTURES): Vit D, 25-Hydroxy: 17.08 ng/mL — ABNORMAL LOW (ref 30–100)

## 2020-10-19 LAB — PHOSPHORUS: Phosphorus: 2.8 mg/dL (ref 2.5–4.6)

## 2020-10-19 LAB — HIV ANTIBODY (ROUTINE TESTING W REFLEX): HIV Screen 4th Generation wRfx: NONREACTIVE

## 2020-10-19 LAB — PROTIME-INR
INR: 1.4 — ABNORMAL HIGH (ref 0.8–1.2)
Prothrombin Time: 16.2 seconds — ABNORMAL HIGH (ref 11.4–15.2)

## 2020-10-19 LAB — TSH: TSH: 4.665 u[IU]/mL — ABNORMAL HIGH (ref 0.350–4.500)

## 2020-10-19 LAB — SURGICAL PCR SCREEN
MRSA, PCR: NEGATIVE
Staphylococcus aureus: POSITIVE — AB

## 2020-10-19 MED ORDER — TRANEXAMIC ACID-NACL 1000-0.7 MG/100ML-% IV SOLN: 1000.0000 mg | INTRAVENOUS | Status: AC

## 2020-10-19 MED ORDER — MUPIROCIN 2 % EX OINT
1.0000 "application " | TOPICAL_OINTMENT | Freq: Two times a day (BID) | CUTANEOUS | Status: AC
  Administered 2020-10-19 – 2020-10-23 (×10): 1 via NASAL
  Filled 2020-10-19 (×2): qty 22

## 2020-10-19 MED ORDER — POVIDONE-IODINE 10 % EX SWAB
2.0000 "application " | Freq: Once | CUTANEOUS | Status: AC
  Administered 2020-10-20: 2 via TOPICAL

## 2020-10-19 MED ORDER — CHLORHEXIDINE GLUCONATE CLOTH 2 % EX PADS
6.0000 | MEDICATED_PAD | Freq: Every day | CUTANEOUS | Status: AC
  Administered 2020-10-19 – 2020-10-23 (×4): 6 via TOPICAL

## 2020-10-19 MED ORDER — ENOXAPARIN SODIUM 30 MG/0.3ML ~~LOC~~ SOLN
30.0000 mg | Freq: Once | SUBCUTANEOUS | Status: AC
  Administered 2020-10-19: 30 mg via SUBCUTANEOUS
  Filled 2020-10-19: qty 0.3

## 2020-10-19 MED ORDER — CHLORHEXIDINE GLUCONATE 4 % EX LIQD
60.0000 mL | Freq: Once | CUTANEOUS | Status: AC
  Administered 2020-10-20: 4 via TOPICAL

## 2020-10-19 MED ORDER — METHOCARBAMOL 1000 MG/10ML IJ SOLN
500.0000 mg | Freq: Four times a day (QID) | INTRAVENOUS | Status: DC | PRN
  Administered 2020-10-20 (×3): 500 mg via INTRAVENOUS
  Filled 2020-10-19: qty 500
  Filled 2020-10-19: qty 5
  Filled 2020-10-19: qty 500
  Filled 2020-10-19: qty 5
  Filled 2020-10-19: qty 500
  Filled 2020-10-19: qty 5

## 2020-10-19 MED ORDER — MORPHINE SULFATE (PF) 2 MG/ML IV SOLN
1.0000 mg | INTRAVENOUS | Status: DC | PRN
  Administered 2020-10-19 – 2020-10-24 (×3): 1 mg via INTRAVENOUS
  Filled 2020-10-19 (×3): qty 1

## 2020-10-19 MED ORDER — HYDROMORPHONE HCL 1 MG/ML IJ SOLN
INTRAMUSCULAR | Status: AC
  Filled 2020-10-19: qty 2

## 2020-10-19 MED ORDER — CEFAZOLIN SODIUM-DEXTROSE 2-4 GM/100ML-% IV SOLN: 2.0000 g | INTRAVENOUS | Status: AC

## 2020-10-19 NOTE — Telephone Encounter (Signed)
Called patient's wife to r/s his 3/23 consult to 3/24 via telephone due to him being admitted to [facility]. No answer, LVM for a return call.

## 2020-10-19 NOTE — Progress Notes (Signed)
OT Cancellation Note  Patient Details Name: Colton Bush MRN: [id number] DOB: [birth year]   Cancelled Treatment:    Reason Eval/Treat Not Completed: Other (comment) imaging (+) right comminuted mildly angulated intertrochanter fracture after ground level fall. Orthopedics consulted in the ED with plan for surgery today. OT will continue efforts toward completion of evaluation post-op.   Gloris Manchester OTR/L Supplemental OT, Department of rehab services [phone]  Adar Rase R H. 10/19/2020, 7:16 AM

## 2020-10-19 NOTE — Plan of Care (Signed)
Pt admitted and oriented to the unit. Pt expressed 10/10 pain with spasms related to R hip fracture and given oxy followed by morphine bringing pain down to 2/10 while lying still. Patient voiding appropriately. Pt expressed some sadness regarding fall along with cancer diagnosis and provided comfort and education regarding care.

## 2020-10-19 NOTE — Progress Notes (Signed)
PROGRESS NOTE    Colton Bush  YBO:[id number] DOB: Mar 27, 1956 DOA: 10/18/2020 PCP: [name], MD   Brief Narrative: Colton Bush is a 65 y.o. male with a history of recurrent pancreatic cancer, diabetes mellitus, hypertension, hyperlipidemia. Patient presented secondary to a fall and subsequent right hip fracture. Plan for surgery.   Assessment & Plan:   Principal Problem:   Closed right hip fracture, initial encounter [facility]) Active Problems:   Essential hypertension   Type 2 diabetes mellitus with other specified complication ([place])   Adenocarcinoma of head of pancreas ([place])   Fall from ground level   Right hip fracture Right comminuted/angulated intertrochanteric fracture. Orthopedic surgery consulted and plan surgery on 3/23. -Orthopedic surgery recommendations: Surgery -Continue oxycodone and morphine prn  Fall Resulting in above. No syncope.  Diabetes mellitus, type 2 Patient is on Levemir and Novolog sliding scale as an outpatient. Conflicting diagnoses on chart review between type 1 and type 2. Started on Levemir 15 units daily and SSI inpatient -Continue Levemir and SSI  Pancreatic cancer Recurrence. Patient follows with Dr. [name] as an outpatient. Plan for radiation therapy as an outpatient. Patient plans to follow up with Dr. [name], radiation oncology. -Continue Creon  Primary hypertension -Continue amlodipine  Hyperlipidemia -Continue Crestor  GERD -Continue Protonix   DVT prophylaxis: Lovenox Code Status:   Code Status: Full Code Family Communication: None at bedside Disposition Plan: Discharge home vs SNF pending orthopedic surgery/PT/OT recommendations   Consultants:   Orthopedic surgery  Procedures:   None  Antimicrobials:  None    Subjective: Right hip without much pain. Mild pain with movement.  Objective: Vitals:   10/18/20 2330 10/19/20 0147 10/19/20 0541 10/19/20 1335  BP:  135/66 (!) 154/72 (!) 170/81  Pulse:  70  80 80  Resp:  16 16 16   Temp:  98.7 F (37.1 C) 98.1 F (36.7 C) 98.1 F (36.7 C)  TempSrc:  Oral Oral Oral  SpO2:  92% 95% 97%  Weight: 88.9 kg     Height: 5\' 10"  (1.778 m)       Intake/Output Summary (Last 24 hours) at 10/19/2020 1543 Last data filed at 10/19/2020 1030 Gross per 24 hour  Intake 0 ml  Output 1300 ml  Net -1300 ml   Filed Weights   10/18/20 2330  Weight: 88.9 kg    Examination:  General exam: Appears calm and comfortable Respiratory system: Clear to auscultation. Respiratory effort normal. Cardiovascular system: S1 & S2 heard, RRR. No murmurs, rubs, gallops or clicks. Gastrointestinal system: Abdomen is nondistended, soft and nontender. No organomegaly or masses felt. Normal bowel sounds heard. Central nervous system: Alert and oriented. No focal neurological deficits. Musculoskeletal: No edema. No calf tenderness Skin: No cyanosis. No rashes Psychiatry: Judgement and insight appear normal. Mood & affect appropriate.     Data Reviewed: I have personally reviewed following labs and imaging studies  CBC Lab Results  Component Value Date   WBC 14.7 (H) 10/19/2020   RBC 3.54 (L) 10/19/2020   HGB 10.7 (L) 10/19/2020   HCT 31.3 (L) 10/19/2020   MCV 88.4 10/19/2020   MCH 30.2 10/19/2020   PLT 373 10/19/2020   MCHC 34.2 10/19/2020   RDW 13.8 10/19/2020   LYMPHSABS 1.0 10/18/2020   MONOABS 0.9 10/18/2020   EOSABS 0.1 10/18/2020   BASOSABS 0.1 27/78/2423     Last metabolic panel Lab Results  Component Value Date   NA 135 10/19/2020   K 3.4 (L) 10/19/2020  CL 102 10/19/2020   CO2 27 10/19/2020   BUN 14 10/19/2020   CREATININE 0.53 (L) 10/19/2020   GLUCOSE 216 (H) 10/19/2020   GFRNONAA >60 10/19/2020   GFRAA >60 04/14/2020   CALCIUM 7.9 (L) 10/19/2020   PHOS 2.8 10/19/2020   PROT 6.8 10/19/2020   PROT 6.8 10/19/2020   ALBUMIN 2.4 (L) 10/19/2020   ALBUMIN 2.3 (L) 10/19/2020   BILITOT 1.0 10/19/2020   BILITOT 0.9 10/19/2020   ALKPHOS 276  (H) 10/19/2020   ALKPHOS 270 (H) 10/19/2020   AST 21 10/19/2020   AST 22 10/19/2020   ALT 31 10/19/2020   ALT 30 10/19/2020   ANIONGAP 6 10/19/2020    CBG (last 3)  Recent Labs    10/18/20 2319 10/19/20 0737 10/19/20 1106  GLUCAP 179* 167* 182*     GFR: Estimated Creatinine Clearance: 100.6 mL/min (A) (by C-G formula based on SCr of 0.53 mg/dL (L)).  Coagulation Profile: Recent Labs  Lab 10/19/20 0259  INR 1.4*    Recent Results (from the past 240 hour(s))  Resp Panel by RT-PCR (Flu A&B, Covid) Nasopharyngeal Swab     Status: None   Collection Time: 10/18/20  7:11 PM   Specimen: Nasopharyngeal Swab; Nasopharyngeal(NP) swabs in vial transport medium  Result Value Ref Range Status   SARS Coronavirus 2 by RT PCR NEGATIVE NEGATIVE Final    Comment: (NOTE) SARS-CoV-2 target nucleic acids are NOT DETECTED.  The SARS-CoV-2 RNA is generally detectable in upper respiratory specimens during the acute phase of infection. The lowest concentration of SARS-CoV-2 viral copies this assay can detect is 138 copies/mL. A negative result does not preclude SARS-Cov-2 infection and should not be used as the sole basis for treatment or other patient management decisions. A negative result may occur with  improper specimen collection/handling, submission of specimen other than nasopharyngeal swab, presence of viral mutation(s) within the areas targeted by this assay, and inadequate number of viral copies(<138 copies/mL). A negative result must be combined with clinical observations, patient history, and epidemiological information. The expected result is Negative.  Fact Sheet for Patients:  EntrepreneurPulse.com.au  Fact Sheet for Healthcare Providers:  IncredibleEmployment.be  This test is no t yet approved or cleared by the [place] FDA and  has been authorized for detection and/or diagnosis of SARS-CoV-2 by FDA under an Emergency Use  Authorization (EUA). This EUA will remain  in effect (meaning this test can be used) for the duration of the COVID-19 declaration under Section 564(b)(1) of the Act, 21 U.S.C.section 360bbb-3(b)(1), unless the authorization is terminated  or revoked sooner.       Influenza A by PCR NEGATIVE NEGATIVE Final   Influenza B by PCR NEGATIVE NEGATIVE Final    Comment: (NOTE) The Xpert Xpress SARS-CoV-2/FLU/RSV plus assay is intended as an aid in the diagnosis of influenza from Nasopharyngeal swab specimens and should not be used as a sole basis for treatment. Nasal washings and aspirates are unacceptable for Xpert Xpress SARS-CoV-2/FLU/RSV testing.  Fact Sheet for Patients: EntrepreneurPulse.com.au  Fact Sheet for Healthcare Providers: IncredibleEmployment.be  This test is not yet approved or cleared by the [place] FDA and has been authorized for detection and/or diagnosis of SARS-CoV-2 by FDA under an Emergency Use Authorization (EUA). This EUA will remain in effect (meaning this test can be used) for the duration of the COVID-19 declaration under Section 564(b)(1) of the Act, 21 U.S.C. section 360bbb-3(b)(1), unless the authorization is terminated or revoked.  Performed at [facility],  [place] [address] [place], [place] [address]   Surgical pcr screen     Status: Abnormal   Collection Time: 10/19/20 12:47 AM   Specimen: Nasal Mucosa; Nasal Swab  Result Value Ref Range Status   MRSA, PCR NEGATIVE NEGATIVE Final   Staphylococcus aureus POSITIVE (A) NEGATIVE Final    Comment: RESULT CALLED TO, READ BACK BY AND VERIFIED WITH: BOBBY, RN @ [phone] ON 10/19/20 C VARNER (NOTE) The Xpert SA Assay (FDA approved for NASAL specimens in patients 59 years of age and older), is one component of a comprehensive surveillance program. It is not intended to diagnose infection nor to guide or monitor treatment. Performed at [facility], [place] [address], [place], [place] [address]         Radiology Studies: DG Chest 1 View  Result Date: 10/18/2020 CLINICAL DATA:  Fall, pain and RIGHT hip. EXAM: CHEST  1 VIEW COMPARISON:  June 19, 2017 FINDINGS: Trachea midline. Cardiomediastinal contours and hilar structures are normal. Signs of atelectasis and scarring at the lung bases. Evidence of cervical spinal fusion partially imaged on the current study. On limited assessment no acute skeletal process. IMPRESSION: Signs of atelectasis and scarring at the lung bases. Electronically Signed   By: Zetta Bills M.D.   On: 10/18/2020 19:12   DG Knee Right Port  Result Date: 10/19/2020 CLINICAL DATA:  History of distal femur fracture. EXAM: PORTABLE RIGHT KNEE - 1-2 VIEW COMPARISON:  No prior. FINDINGS: Limited two view of exam obtained. Mild soft tissue swelling about the knee cannot be excluded. Severe Tricompartment degenerative change with chondrocalcinosis. Deformity noted of the proximal fibula consistent with old healed fracture. No acute fracture identified. IMPRESSION: 1.  Mild soft tissue swelling about the knee cannot be excluded. 2. Severe tricompartment degenerative change with chondrocalcinosis. Deformity noted the proximal right fibula consistent with old healed fracture. No acute bony abnormality identified. Electronically Signed   By: Marcello Moores  Register   On: 10/19/2020 09:46   DG Hip Unilat With Pelvis 2-3 Views Right  Result Date: 10/18/2020 CLINICAL DATA:  Fall, hip pain status post fall EXAM: DG HIP (WITH OR WITHOUT PELVIS) 2-3V RIGHT COMPARISON:  Prior CT imaging of the chest, abdomen and pelvis. FINDINGS: No fracture of the bony pelvis.  Spinal degenerative changes. Comminuted RIGHT proximal femoral fracture, intratrochanteric type with distraction of the lesser trochanter. Mild displacement of distal femur relative to proximal femur, slight anterior displacement 2-3 mm. Mild to moderate varus  angulation at the fracture site. Sclerotic lesion in the LEFT iliac bone unchanged likely large bone island. IMPRESSION: Comminuted mildly angulated RIGHT intratrochanteric fracture. Electronically Signed   By: Zetta Bills M.D.   On: 10/18/2020 19:17        Scheduled Meds: . amLODipine  10 mg Oral Daily  . Chlorhexidine Gluconate Cloth  6 each Topical Q0600  . insulin aspart  0-15 Units Subcutaneous TID WC  . insulin aspart  0-5 Units Subcutaneous QHS  . insulin detemir  15 Units Subcutaneous QHS  . insulin detemir  15 Units Subcutaneous Daily  . lipase/protease/amylase  24,000 Units Oral TID with meals  . mupirocin ointment  1 application Nasal BID  . pantoprazole  40 mg Oral Daily  . potassium chloride SA  20 mEq Oral Daily  . rosuvastatin  20 mg Oral Daily   Continuous Infusions: . methocarbamol (ROBAXIN) IV       LOS: 1 day     Cordelia Poche, MD [facility] 10/19/2020, 3:43  PM  If 7PM-7AM, please contact night-coverage [email or web address]

## 2020-10-19 NOTE — H&P (View-Only) (Signed)
Chief Complaint: Right hip fracture History: Colton Bush is a 65 y.o. male, with PMH of recurrent pancreatic cancer, insulin-dependent diabetes, hypertension, dyslipidemia who presented to the ER on on 10/18/2020 with inability to walk after ground-level fall and was found to have right hip fracture.  Patient states he was in a grocery store parking lot, putting away groceries in his car when he tried to make a quick reaction as a grocery cart was moving but lost his balance and fell on his right side.  He did not hit his head.  He could not get up but with assistance of others he was able to get up however could not place any weight on his right leg.  Due to these concerning findings, he was brought into the ER.  He denies any history of recurrent falls but over the past few months he has noticed he has been weaker.  He recently found out that his pancreatic cancer is back and he is in the midst of returning the months best steps with his oncologist.  Review of systems: He denies any recent fevers, chills, nausea, vomiting.  No loss of consciousness or syncopal episodes.  Past Medical History:  Diagnosis Date  . Arthritis    left hand  . Bronchitis 1977  . Cancer ([place]) 03/09/2016   pancreatic cancer, sees [facility] Cristino Martes [facility]   . Depression    takes Cymbalta daily  . Diabetes mellitus type II    sees Dr. Chalmers Cater   . GERD (gastroesophageal reflux disease)    takes Omeprazole daily  . H/O hiatal hernia   . Hyperlipidemia    takes Zocor daily  . Hypertension    takes Amlodipine daily  . Hypoglycemia 06/18/2017  . Neck pain    C4-7 stenosis and herniated disc  . Neuromuscular disorder ([place])    hiatal hernia  . Scoliosis    slight  . Spinal cord injury, C5-C7 ([place])    c4-c7  . Stiffness of hand joint    d/t cervical issues    No Known Allergies  Current Facility-Administered Medications on File Prior to Encounter  Medication Dose Route Frequency Provider Last  Rate Last Admin  . sodium chloride 0.9 % injection 10 mL  10 mL Intravenous PRN Truitt Merle, MD   10 mL at 02/09/17 1111   Current Outpatient Medications on File Prior to Encounter  Medication Sig Dispense Refill  . amLODipine (NORVASC) 10 MG tablet Take 1 tablet (10 mg total) by mouth daily. 90 tablet 3  . aspirin EC 81 MG tablet Take 81 mg by mouth daily. Swallow whole.    . benazepril (LOTENSIN) 10 MG tablet TAKE 1 TABLET DAILY (Patient taking differently: Take 10 mg by mouth daily.) 90 tablet 1  . CREON 6000-19000 units CPEP Take 1 capsule by mouth with breakfast, with lunch, and with evening meal.     . diphenhydrAMINE HCl, Sleep, 25 MG TBDP Take 25 mg by mouth at bedtime as needed (sleep).    . ferrous sulfate 325 (65 FE) MG tablet Take 325 mg daily with breakfast by mouth.    . Insulin Detemir (LEVEMIR) 100 UNIT/ML Pen Inject 15 Units into the skin daily at 10 pm. (Patient taking differently: Inject 25-35 Units into the skin See admin instructions. Inject 35 units in the morning and 25 units at night) 90 mL 3  . loratadine (CLARITIN) 10 MG tablet Take 10 mg by mouth daily.    Marland Kitchen  Multiple Vitamin (MULTIVITAMIN WITH MINERALS) TABS tablet Take 1 tablet by mouth daily.    . naproxen sodium (ANAPROX) 220 MG tablet Take 440 mg by mouth 2 (two) times daily as needed (for pain.).    Marland Kitchen NOVOLOG FLEXPEN 100 UNIT/ML FlexPen GIVE EVERY MORNING WITH BREAKFAST AND EVERY EVENING WITH SUPPER PER SLIDING SCALE (Patient taking differently: Inject 3-12 Units into the skin 3 (three) times daily with meals.) 3 mL 0  . Omega-3 Fatty Acids (FISH OIL) 500 MG CAPS Take 500 mg by mouth daily.    Marland Kitchen omeprazole (PRILOSEC) 20 MG capsule TAKE 1 CAPSULE DAILY (Patient taking differently: Take 20 mg by mouth daily.) 90 capsule 3  . potassium chloride SA (KLOR-CON M20) 20 MEQ tablet Take 1 tablet (20 mEq total) by mouth daily. 90 tablet 3  . rosuvastatin (CRESTOR) 20 MG tablet Take 1 tablet (20 mg total) by mouth daily. 90  tablet 3  . acetaminophen (TYLENOL) 500 MG tablet Take 1,000 mg by mouth every 6 (six) hours as needed for moderate pain or headache.    . B-D UF III MINI PEN NEEDLES 31G X 5 MM MISC Inject into the skin.    Marland Kitchen FREESTYLE LITE test strip USE ONE STRIP TO CHECK GLUCOSE ONCE DAILY. PLEASE  SCHEDULE FOLLOW UP 50 each 0  . Insulin Pen Needle 30G X 5 MM MISC Use one daily with insulin 100 each 2  . Lancets (FREESTYLE) lancets USE AS INSTRUCTED TO CHECK BLOOD SUGAR ONCE A DAY 100 each 2    Physical Exam: Vitals:   10/19/20 0147 10/19/20 0541  BP: 135/66 (!) 154/72  Pulse: 70 80  Resp: 16 16  Temp: 98.7 F (37.1 C) 98.1 F (36.7 C)  SpO2: 92% 95%   Body mass index is 28.12 kg/m. Alert and oriented x3. No shortness of breath or chest pain. Abdomen is soft and nontender.  No rebound tenderness Right lower extremity: Compartments soft and nontender.  Intact  2+ dorsalis pedis/posterior tibialis pulses.  No ankle or knee pain with isolated joint range of motion.  Significant right groin pain with palpation.  No obvious laceration or abrasion to the hip. Left lower extremity: Compartments soft and nontender.  Intact peripheral pulses.  No pain with passive range of motion of the hip, knee, ankle. Neurological exam: Intact sensation light touch throughout the lower extremity.  EHL/tibialis anterior/gastrocnemius is intact with no focal motor deficits.  Image: DG Chest 1 View  Result Date: 10/18/2020 CLINICAL DATA:  Fall, pain and RIGHT hip. EXAM: CHEST  1 VIEW COMPARISON:  June 19, 2017 FINDINGS: Trachea midline. Cardiomediastinal contours and hilar structures are normal. Signs of atelectasis and scarring at the lung bases. Evidence of cervical spinal fusion partially imaged on the current study. On limited assessment no acute skeletal process. IMPRESSION: Signs of atelectasis and scarring at the lung bases. Electronically Signed   By: Zetta Bills M.D.   On: 10/18/2020 19:12   DG Hip Unilat  With Pelvis 2-3 Views Right  Result Date: 10/18/2020 CLINICAL DATA:  Fall, hip pain status post fall EXAM: DG HIP (WITH OR WITHOUT PELVIS) 2-3V RIGHT COMPARISON:  Prior CT imaging of the chest, abdomen and pelvis. FINDINGS: No fracture of the bony pelvis.  Spinal degenerative changes. Comminuted RIGHT proximal femoral fracture, intratrochanteric type with distraction of the lesser trochanter. Mild displacement of distal femur relative to proximal femur, slight anterior displacement 2-3 mm. Mild to moderate varus angulation at the fracture site. Sclerotic lesion in the LEFT  iliac bone unchanged likely large bone island. IMPRESSION: Comminuted mildly angulated RIGHT intratrochanteric fracture. Electronically Signed   By: Zetta Bills M.D.   On: 10/18/2020 19:17    A/P: Colton Bush is a very pleasant 65 year old gentleman who unfortunately had a fall and has a right intertrochanteric hip fracture with displacement of the lesser trochanter.  This point time he has been admitted to the medical service for preoperative clearance.  I will discuss definitive Dr. Management with my partner further recommendations pending.  Patient should remain n.p.o. for now.  ADDENDUM: Dr. Rolena Infante has reviewed images and spoken with Dr. Lyla Glassing who has agreed to operate on  Patient tomorrow. Patient may eat today and be made NPO at midnight.

## 2020-10-19 NOTE — Progress Notes (Signed)
PT Cancellation Note  Patient Details Name: Colton Bush MRN: [id number] DOB: 30-Nov-1955   Cancelled Treatment:    Reason Eval/Treat Not Completed: Medical issues which prohibited therapy Awaiting surgery for fracture.     Anginette Espejo,KATHrine E 10/19/2020, 9:00 AM  Arlyce Dice, DPT Acute Rehabilitation Services Pager: [phone] Office: [phone]

## 2020-10-19 NOTE — Consult Note (Addendum)
Chief Complaint: Right hip fracture History: Colton Bush is a 65 y.o. male, with PMH of recurrent pancreatic cancer, insulin-dependent diabetes, hypertension, dyslipidemia who presented to the ER on on 10/18/2020 with inability to walk after ground-level fall and was found to have right hip fracture.  Patient states he was in a grocery store parking lot, putting away groceries in his car when he tried to make a quick reaction as a grocery cart was moving but lost his balance and fell on his right side.  He did not hit his head.  He could not get up but with assistance of others he was able to get up however could not place any weight on his right leg.  Due to these concerning findings, he was brought into the ER.  He denies any history of recurrent falls but over the past few months he has noticed he has been weaker.  He recently found out that his pancreatic cancer is back and he is in the midst of returning the months best steps with his oncologist.  Review of systems: He denies any recent fevers, chills, nausea, vomiting.  No loss of consciousness or syncopal episodes.  Past Medical History:  Diagnosis Date  . Arthritis    left hand  . Bronchitis 1977  . Cancer ([place]) 03/09/2016   pancreatic cancer, sees Dr. Cristino Martes at [facility]   . Depression    takes Cymbalta daily  . Diabetes mellitus type II    sees Dr. Chalmers Cater   . GERD (gastroesophageal reflux disease)    takes Omeprazole daily  . H/O hiatal hernia   . Hyperlipidemia    takes Zocor daily  . Hypertension    takes Amlodipine daily  . Hypoglycemia 06/18/2017  . Neck pain    C4-7 stenosis and herniated disc  . Neuromuscular disorder ([place])    hiatal hernia  . Scoliosis    slight  . Spinal cord injury, C5-C7 ([place])    c4-c7  . Stiffness of hand joint    d/t cervical issues    No Known Allergies  Current Facility-Administered Medications on File Prior to Encounter  Medication Dose Route Frequency Provider Last  Rate Last Admin  . sodium chloride 0.9 % injection 10 mL  10 mL Intravenous PRN Truitt Merle, MD   10 mL at 02/09/17 1111   Current Outpatient Medications on File Prior to Encounter  Medication Sig Dispense Refill  . amLODipine (NORVASC) 10 MG tablet Take 1 tablet (10 mg total) by mouth daily. 90 tablet 3  . aspirin EC 81 MG tablet Take 81 mg by mouth daily. Swallow whole.    . benazepril (LOTENSIN) 10 MG tablet TAKE 1 TABLET DAILY (Patient taking differently: Take 10 mg by mouth daily.) 90 tablet 1  . CREON 6000-19000 units CPEP Take 1 capsule by mouth with breakfast, with lunch, and with evening meal.     . diphenhydrAMINE HCl, Sleep, 25 MG TBDP Take 25 mg by mouth at bedtime as needed (sleep).    . ferrous sulfate 325 (65 FE) MG tablet Take 325 mg daily with breakfast by mouth.    . Insulin Detemir (LEVEMIR) 100 UNIT/ML Pen Inject 15 Units into the skin daily at 10 pm. (Patient taking differently: Inject 25-35 Units into the skin See admin instructions. Inject 35 units in the morning and 25 units at night) 90 mL 3  . loratadine (CLARITIN) 10 MG tablet Take 10 mg by mouth daily.    Marland Kitchen  Multiple Vitamin (MULTIVITAMIN WITH MINERALS) TABS tablet Take 1 tablet by mouth daily.    . naproxen sodium (ANAPROX) 220 MG tablet Take 440 mg by mouth 2 (two) times daily as needed (for pain.).    Marland Kitchen NOVOLOG FLEXPEN 100 UNIT/ML FlexPen GIVE EVERY MORNING WITH BREAKFAST AND EVERY EVENING WITH SUPPER PER SLIDING SCALE (Patient taking differently: Inject 3-12 Units into the skin 3 (three) times daily with meals.) 3 mL 0  . Omega-3 Fatty Acids (FISH OIL) 500 MG CAPS Take 500 mg by mouth daily.    Marland Kitchen omeprazole (PRILOSEC) 20 MG capsule TAKE 1 CAPSULE DAILY (Patient taking differently: Take 20 mg by mouth daily.) 90 capsule 3  . potassium chloride SA (KLOR-CON M20) 20 MEQ tablet Take 1 tablet (20 mEq total) by mouth daily. 90 tablet 3  . rosuvastatin (CRESTOR) 20 MG tablet Take 1 tablet (20 mg total) by mouth daily. 90  tablet 3  . acetaminophen (TYLENOL) 500 MG tablet Take 1,000 mg by mouth every 6 (six) hours as needed for moderate pain or headache.    . B-D UF III MINI PEN NEEDLES 31G X 5 MM MISC Inject into the skin.    Marland Kitchen FREESTYLE LITE test strip USE ONE STRIP TO CHECK GLUCOSE ONCE DAILY. PLEASE  SCHEDULE FOLLOW UP 50 each 0  . Insulin Pen Needle 30G X 5 MM MISC Use one daily with insulin 100 each 2  . Lancets (FREESTYLE) lancets USE AS INSTRUCTED TO CHECK BLOOD SUGAR ONCE A DAY 100 each 2    Physical Exam: Vitals:   10/19/20 0147 10/19/20 0541  BP: 135/66 (!) 154/72  Pulse: 70 80  Resp: 16 16  Temp: 98.7 F (37.1 C) 98.1 F (36.7 C)  SpO2: 92% 95%   Body mass index is 28.12 kg/m. Alert and oriented x3. No shortness of breath or chest pain. Abdomen is soft and nontender.  No rebound tenderness Right lower extremity: Compartments soft and nontender.  Intact  2+ dorsalis pedis/posterior tibialis pulses.  No ankle or knee pain with isolated joint range of motion.  Significant right groin pain with palpation.  No obvious laceration or abrasion to the hip. Left lower extremity: Compartments soft and nontender.  Intact peripheral pulses.  No pain with passive range of motion of the hip, knee, ankle. Neurological exam: Intact sensation light touch throughout the lower extremity.  EHL/tibialis anterior/gastrocnemius is intact with no focal motor deficits.  Image: DG Chest 1 View  Result Date: 10/18/2020 CLINICAL DATA:  Fall, pain and RIGHT hip. EXAM: CHEST  1 VIEW COMPARISON:  June 19, 2017 FINDINGS: Trachea midline. Cardiomediastinal contours and hilar structures are normal. Signs of atelectasis and scarring at the lung bases. Evidence of cervical spinal fusion partially imaged on the current study. On limited assessment no acute skeletal process. IMPRESSION: Signs of atelectasis and scarring at the lung bases. Electronically Signed   By: Zetta Bills M.D.   On: 10/18/2020 19:12   DG Hip Unilat  With Pelvis 2-3 Views Right  Result Date: 10/18/2020 CLINICAL DATA:  Fall, hip pain status post fall EXAM: DG HIP (WITH OR WITHOUT PELVIS) 2-3V RIGHT COMPARISON:  Prior CT imaging of the chest, abdomen and pelvis. FINDINGS: No fracture of the bony pelvis.  Spinal degenerative changes. Comminuted RIGHT proximal femoral fracture, intratrochanteric type with distraction of the lesser trochanter. Mild displacement of distal femur relative to proximal femur, slight anterior displacement 2-3 mm. Mild to moderate varus angulation at the fracture site. Sclerotic lesion in the LEFT  iliac bone unchanged likely large bone island. IMPRESSION: Comminuted mildly angulated RIGHT intratrochanteric fracture. Electronically Signed   By: Zetta Bills M.D.   On: 10/18/2020 19:17    A/P: Savalas is a very pleasant 65 year old gentleman who unfortunately had a fall and has a right intertrochanteric hip fracture with displacement of the lesser trochanter.  This point time he has been admitted to the medical service for preoperative clearance.  I will discuss definitive Dr. Management with my partner further recommendations pending.  Patient should remain n.p.o. for now.  ADDENDUM: Dr. Rolena Infante has reviewed images and spoken with Dr. Lyla Glassing who has agreed to operate on  Patient tomorrow. Patient may eat today and be made NPO at midnight.

## 2020-10-20 ENCOUNTER — Encounter (HOSPITAL_COMMUNITY): Payer: Self-pay | Admitting: Internal Medicine

## 2020-10-20 ENCOUNTER — Inpatient Hospital Stay (HOSPITAL_COMMUNITY): Payer: Medicare Other | Admitting: Anesthesiology

## 2020-10-20 ENCOUNTER — Ambulatory Visit: Admission: RE | Admit: 2020-10-20 | Payer: Medicare Other | Source: Ambulatory Visit | Admitting: Radiation Oncology

## 2020-10-20 ENCOUNTER — Ambulatory Visit: Payer: Medicare Other

## 2020-10-20 DIAGNOSIS — S72001A Fracture of unspecified part of neck of right femur, initial encounter for closed fracture: Secondary | ICD-10-CM | POA: Diagnosis not present

## 2020-10-20 LAB — URINALYSIS, ROUTINE W REFLEX MICROSCOPIC
Bilirubin Urine: NEGATIVE
Glucose, UA: NEGATIVE mg/dL
Ketones, ur: NEGATIVE mg/dL
Nitrite: POSITIVE — AB
Protein, ur: 30 mg/dL — AB
Specific Gravity, Urine: 1.02 (ref 1.005–1.030)
pH: 5 (ref 5.0–8.0)

## 2020-10-20 LAB — CBC
HCT: 30.7 % — ABNORMAL LOW (ref 39.0–52.0)
Hemoglobin: 10.4 g/dL — ABNORMAL LOW (ref 13.0–17.0)
MCH: 30.2 pg (ref 26.0–34.0)
MCHC: 33.9 g/dL (ref 30.0–36.0)
MCV: 89.2 fL (ref 80.0–100.0)
Platelets: 385 10*3/uL (ref 150–400)
RBC: 3.44 MIL/uL — ABNORMAL LOW (ref 4.22–5.81)
RDW: 14.4 % (ref 11.5–15.5)
WBC: 32.4 10*3/uL — ABNORMAL HIGH (ref 4.0–10.5)
nRBC: 0 % (ref 0.0–0.2)

## 2020-10-20 LAB — COMPREHENSIVE METABOLIC PANEL
ALT: 23 U/L (ref 0–44)
AST: 19 U/L (ref 15–41)
Albumin: 2.1 g/dL — ABNORMAL LOW (ref 3.5–5.0)
Alkaline Phosphatase: 254 U/L — ABNORMAL HIGH (ref 38–126)
Anion gap: 7 (ref 5–15)
BUN: 20 mg/dL (ref 8–23)
CO2: 26 mmol/L (ref 22–32)
Calcium: 7.9 mg/dL — ABNORMAL LOW (ref 8.9–10.3)
Chloride: 105 mmol/L (ref 98–111)
Creatinine, Ser: 0.91 mg/dL (ref 0.61–1.24)
GFR, Estimated: >60 mL/min (ref >60)
Glucose, Bld: 202 mg/dL — ABNORMAL HIGH (ref 70–99)
Potassium: 3.5 mmol/L (ref 3.5–5.1)
Sodium: 138 mmol/L (ref 135–145)
Total Bilirubin: 1 mg/dL (ref 0.3–1.2)
Total Protein: 6.6 g/dL (ref 6.5–8.1)

## 2020-10-20 LAB — GLUCOSE, CAPILLARY
Glucose-Capillary: 114 mg/dL — ABNORMAL HIGH (ref 70–99)
Glucose-Capillary: 125 mg/dL — ABNORMAL HIGH (ref 70–99)
Glucose-Capillary: 223 mg/dL — ABNORMAL HIGH (ref 70–99)
Glucose-Capillary: 230 mg/dL — ABNORMAL HIGH (ref 70–99)
Glucose-Capillary: 237 mg/dL — ABNORMAL HIGH (ref 70–99)
Glucose-Capillary: 64 mg/dL — ABNORMAL LOW (ref 70–99)

## 2020-10-20 MED ORDER — SODIUM CHLORIDE 0.9 % IV SOLN
1.0000 g | INTRAVENOUS | Status: DC
  Administered 2020-10-20 – 2020-10-21 (×2): 1 g via INTRAVENOUS
  Filled 2020-10-20: qty 10
  Filled 2020-10-20: qty 1

## 2020-10-20 MED ORDER — HEPARIN SODIUM (PORCINE) 5000 UNIT/ML IJ SOLN
5000.0000 [IU] | Freq: Three times a day (TID) | INTRAMUSCULAR | Status: DC
  Administered 2020-10-20: 5000 [IU] via SUBCUTANEOUS
  Filled 2020-10-20: qty 1

## 2020-10-20 MED ORDER — INSULIN DETEMIR 100 UNIT/ML ~~LOC~~ SOLN
7.0000 [IU] | Freq: Every day | SUBCUTANEOUS | Status: DC
  Administered 2020-10-20 – 2020-10-21 (×2): 7 [IU] via SUBCUTANEOUS
  Filled 2020-10-20 (×2): qty 0.07

## 2020-10-20 MED ORDER — DEXTROSE 50 % IV SOLN
12.5000 g | INTRAVENOUS | Status: AC
  Administered 2020-10-20: 12.5 g via INTRAVENOUS
  Filled 2020-10-20: qty 50

## 2020-10-20 NOTE — Anesthesia Preprocedure Evaluation (Addendum)
Anesthesia Evaluation  Patient identified by MRN, date of birth, ID band Patient awake    Reviewed: Allergy & Precautions, NPO status , Patient's Chart, lab work & pertinent test results  History of Anesthesia Complications Negative for: history of anesthetic complications  Airway Mallampati: I  TM Distance: >3 FB Neck ROM: Full    Dental  (+) Edentulous Upper, Dental Advisory Given,    Pulmonary neg pulmonary ROS,    breath sounds clear to auscultation       Cardiovascular hypertension, Pt. on medications +CHF (grade 1 diastolic dysfunction)   Rhythm:Regular Rate:Normal - Systolic murmurs Echo 1/51 1. Left ventricular ejection fraction, by estimation, is 55 to 60%. The  left ventricle has normal function. The left ventricle has no regional  wall motion abnormalities. There is mild concentric left ventricular  hypertrophy. Left ventricular diastolic  parameters are consistent with Grade I diastolic dysfunction (impaired  relaxation).  2. Right ventricular systolic function is normal. The right ventricular  size is normal.  3. The mitral valve is normal in structure. No evidence of mitral valve  regurgitation. No evidence of mitral stenosis.  4. The aortic valve is normal in structure. Aortic valve regurgitation is  not visualized. No aortic stenosis is present.  5. The inferior vena cava is normal in size with greater than 50%  respiratory variability, suggesting right atrial pressure of 3 mmHg.    Neuro/Psych PSYCHIATRIC DISORDERS Depression    GI/Hepatic Neg liver ROS, hiatal hernia, GERD  Medicated and Controlled,Recurrent pancreatic ca s/p whipple 2018    Endo/Other  diabetes, Poorly Controlled, Type 2, Insulin Dependenta1c 10.8, FS 336 this AM  Renal/GU negative Renal ROS  negative genitourinary   Musculoskeletal  (+) Arthritis , R femoral neck fx 1 prior back surgery   Abdominal   Peds   Hematology Hgb 10.7, plt 554 INR 1.2   Anesthesia Other Findings Pancreatic ca   Last subQ heparin last night 10pm  Bacteremic on presentation, echo neg for vegetations. 2nd set of BCs drawn 3/25 negative to date (called and spoke w/ microbiology lab this morning and Cx still negative)  Reproductive/Obstetrics negative OB ROS                       Anesthesia Physical Anesthesia Plan  ASA: III  Anesthesia Plan: MAC and Spinal   Post-op Pain Management:    Induction:   PONV Risk Score and Plan: Treatment may vary due to age or medical condition, Propofol infusion and TIVA  Airway Management Planned: Natural Airway and Simple Face Mask  Additional Equipment: None  Intra-op Plan:   Post-operative Plan:   Informed Consent: I have reviewed the patients History and Physical, chart, labs and discussed the procedure including the risks, benefits and alternatives for the proposed anesthesia with the patient or authorized representative who has indicated his/her understanding and acceptance.       Plan Discussed with: CRNA  Anesthesia Plan Comments: (Case cancelled in preop d/t [facility] loss of electricity, currently on backup power)   Anesthesia Quick Evaluation

## 2020-10-20 NOTE — Progress Notes (Signed)
Surgery postponed due to Tmax 100.8. Found to have UTI, started on IV abx. Plan for surgery tomorrow if > 24 hrs afebrile. NPO after MN. Hold chemical DVT ppx.

## 2020-10-20 NOTE — Progress Notes (Incomplete)
GI Location of Tumor / Histology: Adenocarcinoma of head of pancreas  HISTORY OF PRESENT ILLNESS: Colton Bush is a 65 y.o. male seen at the request of Dr. Ardis Hughs. The patient was seen in the ED and admitted on 02/27/16 with a three month history of 40 pound weight loss, poor appetite, jaundice, and fatigue. He underwent CT imaging which revealed an ill defined mass in the head of the pancreas, with pancreatic duct dilatation to 1.2 cm, and common bile duct distention to 1.5 cm. A 2 cm short axis lymph node in the portacaval space was noted. His T bili was 11, and CA 19-9 was 1497 on 02/28/16. On 03/01/16, an ERCP was performed with stent placement in the common bile duct. Brushings were obtained and revealed atypical cells His bilirubin trended down, and he was discharged on 03/02/16. Subsequent endoscopic ultrasound on 03/09/16  Revealed a 35 mm hypoechoic and mixed solid/cystic mass in the pancreatic head. There is suspicion of involvement of the portal vein, but not of the celiac trunk, SMA, or SMV. One peripancreatic node measuring 1.2 cm was noted above the mass, and his stent was in good position. A FNA of the mass revealed adenocarcinoma consistent with pancreatic primary.  During [facility] conference, it was recommended that he consider neoadjuvant chemotherapy, SBRT following chemotherapy, and subsequent surgical resection. He received 5 1/2 weeks of chemoRT.  Since his last visit in 2018, he completed additional Xeloda that have been given at the time of his radiotherapy. He then went on to have diagnostic laparoscopy on 09/19/2016 and unfortunately had vascular involvement as well as concerns for 2 liver lesions so his procedure was aborted. He received gemcitabine and Abraxane but only received 1 cycle as he was also contemplating surgery at [facility], on 11/17/2016 he underwent pancreatectomy with proximal subtotal in total duodenectomy, partial gastrectomy, and Whipple type procedure. Final pathology revealed a  grade 2 adenocarcinoma of the pancreatic head measuring up to 27 mm with perineural invasion, 20 lymph nodes were removed and none contained metastatic disease. He went on to continue with single agent gemcitabine which was completed in August 2018. He continued to do well until imaging showed concerns for possible soft tissue recurrence along the porta hepatis based on PET imaging in September 2021. This was also consistent with an upward trend in his CA 19-9 since the fall 2021. Endoscopic ultrasound on 05/27/2020 was performed by Dr. Ardis Hughs but it was very difficult and the region of the porta hepatis was not visualized due to surgically altered anatomy, this was reattempted at [facility] on 06/04/2020 but again quite limited and a fine-needle aspirate was performed of a 17 mm lesion along the common hepatic artery. Final pathology showed scant blood and hypocellular soft tissue fragments without carcinoma present. A second attempt at [facility] with IR and transcaval biopsy of the soft tissue around the celiac artery was performed on 09/24/2020. Pathology showed scant glandular epithelium suspicious for metastatic adenocarcinoma and immunostains were consistent with pancreatobiliary origin. No additional options of surgical resection are available, and given given the local recurrence and good quality of life and performance status the patient was interested in hearing more about reirradiation.   SAFETY ISSUES:  Prior radiation? Pancreas treated to 45 Gy in 25 fractions, boosted to 54 Gy in 5 fractions 06/28/2016-08/10/2016.  Pacemaker/ICD?   Possible current pregnancy? n/a  Is the patient on methotrexate?   Current Complaints/Details:

## 2020-10-20 NOTE — Progress Notes (Addendum)
PROGRESS NOTE    Colton Bush  Colton Bush DOB: 1955/12/20 DOA: 10/18/2020 PCP: [name], MD   Chief Complaint  Patient presents with  . Fall   Brief Narrative:  Colton Bush is Colton Bush 65 y.o. male with Colton Bush history of recurrent pancreatic cancer, diabetes mellitus, hypertension, hyperlipidemia. Patient presented secondary to Colton Bush fall and subsequent right hip fracture. Plan for surgery.  Assessment & Plan:   Principal Problem:   Closed right hip fracture, initial encounter [facility]) Active Problems:   Essential hypertension   Type 2 diabetes mellitus with other specified complication ([place])   Adenocarcinoma of head of pancreas ([place])   Fall from ground level  Systemic Inflammatory Response Syndrome  Urinary Tract Infection  Fever 3/22 PM as well as worsened leukocytosis 3/23 -> seems out of proportion to what would be expected with hip fx due to reactive leukocytosis.   Blood cx pending UA concerning for UTI -> urine cx pending, start ceftriaxone - fever and leukocytosis seem out of proportion to what I'd expect with UTI as well.  Follow pending blood and urine cx.  Discussed with orthopedics, will postpone surgery today and follow cultures (Dr. Lyla Glassing).   Right hip fracture Right comminuted/angulated intertrochanteric fracture. Orthopedic surgery consulted.  Surgery pending workup for fever and leukocytosis today).  -Orthopedic surgery recommendations: Surgery -Continue oxycodone and morphine prn  Fall Resulting in above. No syncope.  Diabetes mellitus, type 2 Patient is on Levemir and Novolog sliding scale as an outpatient. Conflicting diagnoses on chart review between type 1 and type 2. Started on Levemir 15 units BID. -hypoglycemia this AM, hold PM levemir, follow -Continue Levemir and SSI  Pancreatic cancer Recurrence. Patient follows with Dr. [name] as an outpatient. Plan for radiation therapy as an outpatient. Patient plans to follow up with Dr. [name], radiation  oncology. -Continue Creon -added Dr. [name] to treatment team  Primary hypertension -Continue amlodipine  Hyperlipidemia -Continue Crestor  GERD -Continue Protonix  DVT prophylaxis: heparin - hold after tonights dose in case of surgery 3/24 Code Status: full  Family Communication: none at bedside Disposition:   Status is: Inpatient  Remains inpatient appropriate because:Inpatient level of care appropriate due to severity of illness   Dispo: The patient is from: Home              Anticipated d/c is to: pending              Patient currently is not medically stable to d/c.   Difficult to place patient No   Consultants:   ortho  Procedures: none  Antimicrobials:  Anti-infectives (From admission, onward)   Start     Dose/Rate Route Frequency Ordered Stop   10/20/20 1230  cefTRIAXone (ROCEPHIN) 1 g in sodium chloride 0.9 % 100 mL IVPB        1 g 200 mL/hr over 30 Minutes Intravenous Every 24 hours 10/20/20 1141     10/20/20 0600  ceFAZolin (ANCEF) IVPB 2g/100 mL premix        2 g 200 mL/hr over 30 Minutes Intravenous On call to O.R. 10/19/20 2233 10/21/20 0559     Subjective: No new complaints, notes hip pain ok right now Noted chills last night, told he had fever  Objective: Vitals:   10/19/20 2352 10/20/20 0151 10/20/20 0552 10/20/20 1344  BP:   115/71 131/67  Pulse: 89 68 67 83  Resp:    14  Temp: 99.3 F (37.4 C) 98.8 F (37.1 C) 98.4 F (36.9  C) 98.7 F (37.1 C)  TempSrc: Oral Oral Oral   SpO2: 91%  93% 95%  Weight:      Height:        Intake/Output Summary (Last 24 hours) at 10/20/2020 1511 Last data filed at 10/20/2020 1249 Gross per 24 hour  Intake 390 ml  Output 2550 ml  Net -2160 ml   Filed Weights   10/18/20 2330  Weight: 88.9 kg    Examination:  General exam: Appears calm and comfortable  Respiratory system: Clear to auscultation. Respiratory effort normal. Cardiovascular system: S1 & S2 heard, RRR. Gastrointestinal system:  Abdomen is nondistended, soft and nontender Central nervous system: Alert and oriented. No focal neurological deficits. Extremities: no significant LEE, palpable distal pulses Skin: No rashes, lesions or ulcers Psychiatry: Judgement and insight appear normal. Mood & affect appropriate.     Data Reviewed: I have personally reviewed following labs and imaging studies  CBC: Recent Labs  Lab 10/18/20 1911 10/19/20 0259 10/20/20 0318  WBC 23.6* 14.7* 32.4*  NEUTROABS 21.4*  --   --   HGB 11.6* 10.7* 10.4*  HCT 34.4* 31.3* 30.7*  MCV 90.1 88.4 89.2  PLT 366 373 829    Basic Metabolic Panel: Recent Labs  Lab 10/18/20 1911 10/19/20 0259 10/20/20 0318  NA 135 135 138  K 3.7 3.4* 3.5  CL 100 102 105  CO2 25 27 26   GLUCOSE 269* 216* 202*  BUN 19 14 20   CREATININE 0.70 0.53* 0.91  CALCIUM 8.0* 7.9* 7.9*  MG  --  2.1  --   PHOS  --  2.8  --     GFR: Estimated Creatinine Clearance: 88.5 mL/min (by C-G formula based on SCr of 0.91 mg/dL).  Liver Function Tests: Recent Labs  Lab 10/19/20 0259 10/20/20 0318  AST 22  21 19   ALT 30  31 23   ALKPHOS 270*  276* 254*  BILITOT 0.9  1.0 1.0  PROT 6.8  6.8 6.6  ALBUMIN 2.3*  2.4* 2.1*    CBG: Recent Labs  Lab 10/19/20 1659 10/19/20 2152 10/20/20 0748 10/20/20 1145 10/20/20 1237  GLUCAP 139* 159* 125* 64* 114*     Recent Results (from the past 240 hour(s))  Resp Panel by RT-PCR (Flu Bessie Livingood&B, Covid) Nasopharyngeal Swab     Status: None   Collection Time: 10/18/20  7:11 PM   Specimen: Nasopharyngeal Swab; Nasopharyngeal(NP) swabs in vial transport medium  Result Value Ref Range Status   SARS Coronavirus 2 by RT PCR NEGATIVE NEGATIVE Final    Comment: (NOTE) SARS-CoV-2 target nucleic acids are NOT DETECTED.  The SARS-CoV-2 RNA is generally detectable in upper respiratory specimens during the acute phase of infection. The lowest concentration of SARS-CoV-2 viral copies this assay can detect is 138 copies/mL. Silvia Markuson  negative result does not preclude SARS-Cov-2 infection and should not be used as the sole basis for treatment or other patient management decisions. Shalonda Sachse negative result may occur with  improper specimen collection/handling, submission of specimen other than nasopharyngeal swab, presence of viral mutation(s) within the areas targeted by this assay, and inadequate number of viral copies(<138 copies/mL). Gwynn Chalker negative result must be combined with clinical observations, patient history, and epidemiological information. The expected result is Negative.  Fact Sheet for Patients:  EntrepreneurPulse.com.au  Fact Sheet for Healthcare Providers:  IncredibleEmployment.be  This test is no t yet approved or cleared by the [place] FDA and  has been authorized for detection and/or diagnosis of SARS-CoV-2 by FDA under an Emergency  Use Authorization (EUA). This EUA will remain  in effect (meaning this test can be used) for the duration of the COVID-19 declaration under Section 564(b)(1) of the Act, 21 U.S.C.section 360bbb-3(b)(1), unless the authorization is terminated  or revoked sooner.       Influenza Dolce Sylvia by PCR NEGATIVE NEGATIVE Final   Influenza B by PCR NEGATIVE NEGATIVE Final    Comment: (NOTE) The Xpert Xpress SARS-CoV-2/FLU/RSV plus assay is intended as an aid in the diagnosis of influenza from Nasopharyngeal swab specimens and should not be used as Jobany Montellano sole basis for treatment. Nasal washings and aspirates are unacceptable for Xpert Xpress SARS-CoV-2/FLU/RSV testing.  Fact Sheet for Patients: EntrepreneurPulse.com.au  Fact Sheet for Healthcare Providers: IncredibleEmployment.be  This test is not yet approved or cleared by the [place] FDA and has been authorized for detection and/or diagnosis of SARS-CoV-2 by FDA under an Emergency Use Authorization (EUA). This EUA will remain in effect (meaning this test can  be used) for the duration of the COVID-19 declaration under Section 564(b)(1) of the Act, 21 U.S.C. section 360bbb-3(b)(1), unless the authorization is terminated or revoked.  Performed at [facility], [place] [address], [place], [place] [address]   Surgical pcr screen     Status: Abnormal   Collection Time: 10/19/20 12:47 AM   Specimen: Nasal Mucosa; Nasal Swab  Result Value Ref Range Status   MRSA, PCR NEGATIVE NEGATIVE Final   Staphylococcus aureus POSITIVE (Zyrah Wiswell) NEGATIVE Final    Comment: RESULT CALLED TO, READ BACK BY AND VERIFIED WITH: BOBBY, RN @ [phone] ON 10/19/20 C VARNER (NOTE) The Xpert SA Assay (FDA approved for NASAL specimens in patients 85 years of age and older), is one component of Olumide Dolinger comprehensive surveillance program. It is not intended to diagnose infection nor to guide or monitor treatment. Performed at [facility], [place] [address] [place], [place] [address]          Radiology Studies: DG Chest 1 View  Result Date: 10/18/2020 CLINICAL DATA:  Fall, pain and RIGHT hip. EXAM: CHEST  1 VIEW COMPARISON:  June 19, 2017 FINDINGS: Trachea midline. Cardiomediastinal contours and hilar structures are normal. Signs of atelectasis and scarring at the lung bases. Evidence of cervical spinal fusion partially imaged on the current study. On limited assessment no acute skeletal process. IMPRESSION: Signs of atelectasis and scarring at the lung bases. Electronically Signed   By: Zetta Bills M.D.   On: 10/18/2020 19:12   DG Knee Right Port  Result Date: 10/19/2020 CLINICAL DATA:  History of distal femur fracture. EXAM: PORTABLE RIGHT KNEE - 1-2 VIEW COMPARISON:  No prior. FINDINGS: Limited two view of exam obtained. Mild soft tissue swelling about the knee cannot be excluded. Severe Tricompartment degenerative change with chondrocalcinosis. Deformity noted of the proximal fibula consistent with old healed fracture. No acute fracture  identified. IMPRESSION: 1.  Mild soft tissue swelling about the knee cannot be excluded. 2. Severe tricompartment degenerative change with chondrocalcinosis. Deformity noted the proximal right fibula consistent with old healed fracture. No acute bony abnormality identified. Electronically Signed   By: Marcello Moores  Register   On: 10/19/2020 09:46   DG Hip Unilat With Pelvis 2-3 Views Right  Result Date: 10/18/2020 CLINICAL DATA:  Fall, hip pain status post fall EXAM: DG HIP (WITH OR WITHOUT PELVIS) 2-3V RIGHT COMPARISON:  Prior CT imaging of the chest, abdomen and pelvis. FINDINGS: No fracture of the bony pelvis.  Spinal degenerative changes. Comminuted RIGHT proximal femoral fracture, intratrochanteric  type with distraction of the lesser trochanter. Mild displacement of distal femur relative to proximal femur, slight anterior displacement 2-3 mm. Mild to moderate varus angulation at the fracture site. Sclerotic lesion in the LEFT iliac bone unchanged likely large bone island. IMPRESSION: Comminuted mildly angulated RIGHT intratrochanteric fracture. Electronically Signed   By: Zetta Bills M.D.   On: 10/18/2020 19:17        Scheduled Meds: . amLODipine  10 mg Oral Daily  . Chlorhexidine Gluconate Cloth  6 each Topical Q0600  . insulin aspart  0-15 Units Subcutaneous TID WC  . insulin aspart  0-5 Units Subcutaneous QHS  . insulin detemir  15 Units Subcutaneous QHS  . insulin detemir  15 Units Subcutaneous Daily  . lipase/protease/amylase  24,000 Units Oral TID with meals  . mupirocin ointment  1 application Nasal BID  . pantoprazole  40 mg Oral Daily  . potassium chloride SA  20 mEq Oral Daily  . rosuvastatin  20 mg Oral Daily   Continuous Infusions: .  ceFAZolin (ANCEF) IV    . cefTRIAXone (ROCEPHIN)  IV 1 g (10/20/20 1335)  . methocarbamol (ROBAXIN) IV Stopped (10/20/20 0233)  . tranexamic acid       LOS: 2 days    Time spent: over 30 min    Fayrene Helper, MD [facility]   To contact the attending provider between 7A-7P or the covering provider during after hours 7P-7A, please log into the web site [email or web address] and access using universal [place] password for that web site. If you do not have the password, please call the hospital operator.  10/20/2020, 3:11 PM

## 2020-10-21 ENCOUNTER — Inpatient Hospital Stay: Admit: 2020-10-21 | Payer: Medicare Other | Admitting: Radiation Oncology

## 2020-10-21 ENCOUNTER — Telehealth: Payer: Self-pay | Admitting: Radiation Oncology

## 2020-10-21 ENCOUNTER — Encounter (HOSPITAL_COMMUNITY): Payer: Self-pay | Admitting: Internal Medicine

## 2020-10-21 DIAGNOSIS — E119 Type 2 diabetes mellitus without complications: Secondary | ICD-10-CM | POA: Diagnosis not present

## 2020-10-21 DIAGNOSIS — Z794 Long term (current) use of insulin: Secondary | ICD-10-CM | POA: Diagnosis not present

## 2020-10-21 DIAGNOSIS — C250 Malignant neoplasm of head of pancreas: Secondary | ICD-10-CM | POA: Diagnosis not present

## 2020-10-21 DIAGNOSIS — N390 Urinary tract infection, site not specified: Secondary | ICD-10-CM | POA: Diagnosis not present

## 2020-10-21 DIAGNOSIS — S72001A Fracture of unspecified part of neck of right femur, initial encounter for closed fracture: Secondary | ICD-10-CM | POA: Diagnosis not present

## 2020-10-21 LAB — CBC WITH DIFFERENTIAL/PLATELET
Abs Immature Granulocytes: 0.08 10*3/uL — ABNORMAL HIGH (ref 0.00–0.07)
Basophils Absolute: 0.1 10*3/uL (ref 0.0–0.1)
Basophils Relative: 0 %
Eosinophils Absolute: 0.2 10*3/uL (ref 0.0–0.5)
Eosinophils Relative: 1 %
HCT: 30.1 % — ABNORMAL LOW (ref 39.0–52.0)
Hemoglobin: 10 g/dL — ABNORMAL LOW (ref 13.0–17.0)
Immature Granulocytes: 1 %
Lymphocytes Relative: 10 %
Lymphs Abs: 1.6 10*3/uL (ref 0.7–4.0)
MCH: 29.9 pg (ref 26.0–34.0)
MCHC: 33.2 g/dL (ref 30.0–36.0)
MCV: 90.1 fL (ref 80.0–100.0)
Monocytes Absolute: 0.8 10*3/uL (ref 0.1–1.0)
Monocytes Relative: 5 %
Neutro Abs: 13.4 10*3/uL — ABNORMAL HIGH (ref 1.7–7.7)
Neutrophils Relative %: 83 %
Platelets: 406 10*3/uL — ABNORMAL HIGH (ref 150–400)
RBC: 3.34 MIL/uL — ABNORMAL LOW (ref 4.22–5.81)
RDW: 14.4 % (ref 11.5–15.5)
WBC: 16.1 10*3/uL — ABNORMAL HIGH (ref 4.0–10.5)
nRBC: 0 % (ref 0.0–0.2)

## 2020-10-21 LAB — COMPREHENSIVE METABOLIC PANEL
ALT: 25 U/L (ref 0–44)
AST: 31 U/L (ref 15–41)
Albumin: 2.1 g/dL — ABNORMAL LOW (ref 3.5–5.0)
Alkaline Phosphatase: 222 U/L — ABNORMAL HIGH (ref 38–126)
Anion gap: 7 (ref 5–15)
BUN: 19 mg/dL (ref 8–23)
CO2: 24 mmol/L (ref 22–32)
Calcium: 7.5 mg/dL — ABNORMAL LOW (ref 8.9–10.3)
Chloride: 102 mmol/L (ref 98–111)
Creatinine, Ser: 0.78 mg/dL (ref 0.61–1.24)
GFR, Estimated: >60 mL/min (ref >60)
Glucose, Bld: 217 mg/dL — ABNORMAL HIGH (ref 70–99)
Potassium: 3.4 mmol/L — ABNORMAL LOW (ref 3.5–5.1)
Sodium: 133 mmol/L — ABNORMAL LOW (ref 135–145)
Total Bilirubin: 0.7 mg/dL (ref 0.3–1.2)
Total Protein: 6.5 g/dL (ref 6.5–8.1)

## 2020-10-21 LAB — GLUCOSE, CAPILLARY
Glucose-Capillary: 190 mg/dL — ABNORMAL HIGH (ref 70–99)
Glucose-Capillary: 183 mg/dL — ABNORMAL HIGH (ref 70–99)
Glucose-Capillary: 148 mg/dL — ABNORMAL HIGH (ref 70–99)
Glucose-Capillary: 107 mg/dL — ABNORMAL HIGH (ref 70–99)
Glucose-Capillary: 212 mg/dL — ABNORMAL HIGH (ref 70–99)
Glucose-Capillary: 202 mg/dL — ABNORMAL HIGH (ref 70–99)

## 2020-10-21 LAB — PHOSPHORUS: Phosphorus: 2.6 mg/dL (ref 2.5–4.6)

## 2020-10-21 LAB — BLOOD CULTURE ID PANEL (REFLEXED) - BCID2

## 2020-10-21 LAB — MAGNESIUM: Magnesium: 2.2 mg/dL (ref 1.7–2.4)

## 2020-10-21 MED ORDER — TRANEXAMIC ACID-NACL 1000-0.7 MG/100ML-% IV SOLN: 1000.0000 mg | INTRAVENOUS | Status: DC

## 2020-10-21 MED ORDER — LACTATED RINGERS IV SOLN: INTRAVENOUS | Status: DC

## 2020-10-21 MED ORDER — CHLORHEXIDINE GLUCONATE 0.12 % MT SOLN
15.0000 mL | Freq: Once | OROMUCOSAL | Status: AC
  Administered 2020-10-21: 15 mL via OROMUCOSAL

## 2020-10-21 MED ORDER — CEFAZOLIN SODIUM-DEXTROSE 2-4 GM/100ML-% IV SOLN: 2.0000 g | Freq: Once | INTRAVENOUS | Status: DC

## 2020-10-21 MED ORDER — POTASSIUM CHLORIDE 10 MEQ/100ML IV SOLN
10.0000 meq | INTRAVENOUS | Status: AC
  Administered 2020-10-21 (×3): 10 meq via INTRAVENOUS
  Filled 2020-10-21 (×3): qty 100

## 2020-10-21 MED ORDER — HEPARIN SODIUM (PORCINE) 5000 UNIT/ML IJ SOLN
5000.0000 [IU] | Freq: Three times a day (TID) | INTRAMUSCULAR | Status: AC
  Administered 2020-10-21 – 2020-10-23 (×7): 5000 [IU] via SUBCUTANEOUS
  Filled 2020-10-21 (×7): qty 1

## 2020-10-21 MED ORDER — INSULIN DETEMIR 100 UNIT/ML ~~LOC~~ SOLN
15.0000 [IU] | Freq: Every day | SUBCUTANEOUS | Status: DC
  Administered 2020-10-22 – 2020-10-23 (×2): 15 [IU] via SUBCUTANEOUS
  Filled 2020-10-21 (×2): qty 0.15

## 2020-10-21 MED ORDER — FENTANYL CITRATE (PF) 100 MCG/2ML IJ SOLN
INTRAMUSCULAR | Status: AC
  Filled 2020-10-21: qty 2

## 2020-10-21 MED ORDER — INSULIN DETEMIR 100 UNIT/ML ~~LOC~~ SOLN
7.0000 [IU] | Freq: Every day | SUBCUTANEOUS | Status: DC
  Administered 2020-10-21: 7 [IU] via SUBCUTANEOUS
  Filled 2020-10-21: qty 0.07

## 2020-10-21 MED ORDER — TRANEXAMIC ACID-NACL 1000-0.7 MG/100ML-% IV SOLN
INTRAVENOUS | Status: AC
  Filled 2020-10-21: qty 100

## 2020-10-21 MED ORDER — ACETAMINOPHEN 500 MG PO TABS
1000.0000 mg | ORAL_TABLET | Freq: Once | ORAL | Status: AC
  Administered 2020-10-21: 1000 mg via ORAL

## 2020-10-21 MED ORDER — SODIUM CHLORIDE 0.9 % IV SOLN
1.0000 g | Freq: Once | INTRAVENOUS | Status: AC
  Administered 2020-10-21: 1 g via INTRAVENOUS
  Filled 2020-10-21: qty 1

## 2020-10-21 MED ORDER — CEFAZOLIN SODIUM-DEXTROSE 2-4 GM/100ML-% IV SOLN
INTRAVENOUS | Status: AC
  Filled 2020-10-21: qty 100

## 2020-10-21 MED ORDER — CEFAZOLIN SODIUM-DEXTROSE 2-4 GM/100ML-% IV SOLN: 2.0000 g | INTRAVENOUS | Status: DC

## 2020-10-21 MED ORDER — MIDAZOLAM HCL 2 MG/2ML IJ SOLN
INTRAMUSCULAR | Status: AC
  Filled 2020-10-21: qty 2

## 2020-10-21 MED ORDER — ACETAMINOPHEN 500 MG PO TABS
ORAL_TABLET | ORAL | Status: AC
  Filled 2020-10-21: qty 2

## 2020-10-21 MED ORDER — SODIUM CHLORIDE 0.9 % IV SOLN
2.0000 g | INTRAVENOUS | Status: DC
  Administered 2020-10-22 – 2020-10-24 (×3): 2 g via INTRAVENOUS
  Filled 2020-10-21 (×3): qty 2

## 2020-10-21 NOTE — Progress Notes (Addendum)
HEMATOLOGY-ONCOLOGY PROGRESS NOTE  SUBJECTIVE: Colton Bush is followed by our practice for pancreatic adenocarcinoma with local recurrence found in February 2022.  This is not resectable and the patient has been referred to radiation.  This consult is currently pending.  The patient is now hospitalized following a fall in a grocery store parking lot.  He was found to have a right intertrochanter fracture.  He was scheduled to go to the OR yesterday but was found to have a UTI and had a fever of 100.8.  Surgery has been placed on hold.  The patient tells me that he may go to the OR later today.  The patient has been afebrile for the past 24 hours.  He reports discomfort in his right leg.  He denies abdominal pain, nausea, vomiting.  He offers no other complaints today.  Oncology History Overview Note  Cancer Staging Adenocarcinoma of head of pancreas [facility]) Staging form: Pancreas, AJCC 7th Edition - Clinical stage from 03/09/2016: Stage IIB (T2, N1, M0) - Signed by Truitt Merle, MD on 03/17/2016 - Pathologic stage from 11/17/2016: Stage IB (yT2, N0, cM0) - Signed by Truitt Merle, MD on 12/10/2016     Adenocarcinoma of head of pancreas ([place])  02/27/2016 Imaging   CT chest, abdomen and pelvis with contrast showed ill-defined heterogeneity of pancreatic head, highly suspicious for malignancy, ) quit about and biliary ductal dilatation, mildly prominent lymph nodes in the upper abdomen, largest 2 cm in the portocaval . No other metastasis.    03/09/2016 Initial Diagnosis   Adenocarcinoma of head of pancreas ([place])   03/09/2016 Procedure   Upper EUS showed a 3.5 cm irregular mass in the pancreatic head, causing pancreatic and biliary duct obstruction. The mass involves the portal vein 422 mm, strongly suggesting invasion. There is suspicious nearby adenopathy   03/09/2016 Initial Biopsy   Fine-needle aspiration of the pancreatic mass from EUS showed malignant cells consistent with adenocarcinoma.    03/28/2016 -  05/24/2016 Chemotherapy   neoadjuvant chemo with FOLFIRINOX every 2 weeks, for 5 cycles    06/28/2016 - 08/10/2016 Radiation Therapy   Neoadjuvant radiation by Dr. [name] Site/dose:   Pancreas treated to 45 Gy in 25 fractions. The Pancreas was then boosted to 54 Gy in 5 fractions.   06/28/2016 - 08/10/2016 Chemotherapy   Xeloda 2000 mg in the morning and 1500 mg in the evening, on the day of radiation.   09/04/2016 Imaging   CT Chest Abdomen Pelvis IMPRESSION: 1. Poorly defined pancreatic head mass is grossly stable, with associated pancreatic ductal dilatation and portacaval adenopathy. No associated vascular encasement. 2. Common bile duct stent in place with stable biliary ductal dilatation. Left hepatic lobe atrophy. 3. Hepatomegaly.  Spleen is at the upper limits normal in size. 4. Aortic atherosclerosis (ICD10-170.0). Coronary artery calcification.   09/19/2016 Surgery   Diagnostic laparoscopy and possible whipple procedure by Dr. Barry Dienes. Whipple procedure aborted due to the pancreatic cancer involving SMV, SMA and portal veins.   09/19/2016 Pathology Results   Two liver lesions were noted during diagnostic laparoscopy and biopsy revealed benign hepatic parenchyma with assoicated fibrosis with no evidence of malignancy.   10/06/2016 - 10/20/2016 Chemotherapy   Gemcitabine and Abraxane weekly, on day 1, 8, and 15 every 28 days, starting on 10/06/2016, held after1 cycle due to pending surgery at [facility]   10/31/2016 Imaging   CT CAP w contrast at [facility] 10/31/16 Impression: 1.Interval decrease in the size of the ill-defined pancreatic head mass. There is short segment  abutment of the portal vein/superior mesenteric vein but no evidence of venous distortion. 2.No evidence of metastatic disease in the chest, abdomen and pelvis.   11/17/2016 -  Hospital Admission   Patient presents to hospital for nausea and vomitting   11/17/2016 Surgery   pancreatecomy, promximal subtotal with total  duodenectomy, partial  GASTRECTOMY, CHOLEDOCHOENTEROSTOMY AND GASTROJEJUNOSTOMY (WHIPPLE-TYPE PROCEDURE); WITH PANCREATOJEJUNOSTOMY   01/19/2017 - 03/02/2017 Chemotherapy   Gemcitabine monotherapy: 1000mg /m2, d1,8 Q21d x2 cycles planned    05/18/2017 Imaging   CT CAP at [facility] 05/18/17 Impression: 1.Status post Whipple with soft tissue density just posterior to the SMA, which may be postsurgical, lymph node, or less likely recurrent disease. Recommend attention on follow-up 2.No evidence of metastatic disease in the chest, abdomen, and pelvis.     09/20/2017 Imaging   CT CAP at [facility] 09/20/17 Impression: 1. Unchanged subcentimeter soft tissue nodule in the Whipple surgical bed, likely a small lymph node. 2. No evidence of metastatic disease in the chest, abdomen, or pelvis.   01/17/2018 Imaging   01/17/2018 CT CAP Impression: No evidence of recurrent or metastatic disease.   06/13/2018 Imaging   CT CAP W Contrast at [place] on 06/13/18 Impression:  1. Status post Whipple procedure with stable findings at the surgical bed. No convincing evidence of recurrent or metastatic disease within the chest, abdomen, or pelvis.  Electronically Reviewed by:  Gaye Pollack, MD, [facility] Electronically Reviewed on:  06/13/2018 12:15 PM  I have reviewed the images and concur with the above findings.   11/08/2018 Imaging   CT CAP W Contrast  IMPRESSION: 1. No findings of active malignancy. Prior Whipple procedure. Biliary stent in place. 2. Other imaging findings of potential clinical significance: Thoracic scoliosis. Aortic Atherosclerosis (ICD10-I70.0). Multilevel lumbar impingement.     02/06/2019 Imaging   CT CAP W Contrast IMPRESSION: 1. Status post Whipple procedure with no definitive findings to suggest metastatic disease in the chest, abdomen or pelvis. 2. Stable tiny pulmonary nodules in the right upper lobe compared to prior examinations, strongly favored to be benign. 3.  Aortic atherosclerosis, in addition to left main and 2 vessel coronary artery disease. Please note that although the presence of coronary artery calcium documents the presence of coronary artery disease, the severity of this disease and any potential stenosis cannot be assessed on this non-gated CT examination. Assessment for potential risk factor modification, dietary therapy or pharmacologic therapy may be warranted, if clinically indicated. 4. Additional incidental findings, as above.   04/2019 Procedure   He had PAC removed in 04/2019   08/14/2019 Imaging   CT CAP at [facility] 08/14/19 IMPRESSION: 1.  History of pancreatic adenocarcinoma status post Whipple with stable findings at the surgical bed. 2.  No evidence of metastatic disease in the chest, abdomen, or pelvis. 3.  Arterial phase hyperenhancing 9 mm lesion in subcapsular portion of hepatic segment 8, favor a flash filling hemangioma. Attention on follow-up.   09/04/2019 PET scan   IMPRESSION: 1. Post Whipple procedure without evidence of local pancreatic carcinoma recurrence. 2. No evidence metastatic disease in the liver or periportal lymph nodes. 3. No distant metastatic disease.   11/11/2019 Imaging   CT CAP Impression:   1. Stable appearance of arterially enhancing 9 mm lesion within segment 8  of the liver. Continued attention on follow-up.  2. No evidence of metastatic disease within the chest, abdomen, or pelvis.  3. Stable appearance of soft tissue density around the common hepatic  artery, for example series 8, image  44. This may represent post-surgical  changes. Continued attention on follow-up.    02/10/2020 Imaging   CT CAP  Impression:  Similar appearance of ill-defined soft tissue surrounding the common  hepatic artery, favored to be postsurgical. Attention on follow-up.   No definite evidence of metastatic disease.   Previously identified arterially enhancing 9 mm lesion in segment 8 of the  liver not  well evaluated on today's study due to lack of arterial phase  imaging, possibly flash-filling hemangioma. Recommend attention on  follow-up.    04/14/2020 Tumor Marker   CA 19-9 at 1124   04/26/2020 Imaging   PET  IMPRESSION: Status post Whipple procedure.   Soft tissue fullness in the porta hepatis with associated mild hypermetabolism, worrisome for recurrence.   No evidence of distant metastases.   05/27/2020 Procedure   EUS by Dr Ardis Hughs  IMPRESSION - I was unable to visualize the region of the porta hepatitis (where the PET avid soft tissue was noted) due to his surgically altered anatomy    06/04/2020 Pathology Results    Liver lesion biopsy  Fine Needle Aspiration by Dr Mariah Milling at [facility], hypocellular soft tissue fragments.    See Comment.    Comment:  The patient's prior surgical pathology specimen ([id number]) was reviewed. Evidence of adenocarcinoma is absent in this specimen.  Evaluation is limited by scant cellularity.   A repeat tissue sampling procedure may be helpful, if clinically indicated   08/31/2020 Imaging   CT CAP at [facility]  Impression:   1. Increased recurrent soft tissue along the celiac trunk and common and  proper hepatic arteries with unchanged associated occlusion of the  intrahepatic left portal vein and occlusion of the accessory left hepatic  artery. Increased associated narrowing of the common and proper hepatic  arteries.  2. Increased intrahepatic biliary duct dilation likely due to  aforementioned malignant soft tissue tracking into the liver hilum. Suggest  correlation with lab values for biliary obstruction.    08/31/2020 Imaging   MRI abdomen  Impression:   1. No parenchymal abnormality in the previously identified area of concern  in the left hepatic lobe.  2. Similar-appearing atrophy of the left hepatic lobe with mildly increased  intrahepatic bile duct dilation.  3. Multifocal areas of arterial enhancement in  the liver. At least 2 of  these demonstrate uptake on the hepatobiliary phase. Findings could  represent a combination of focal perfusion anomalies and FNH.  4. Ill-defined soft tissue surrounding the celiac trunk is better assessed  on same day CT.    08/31/2020 Imaging   MRI at [facility]  Impression:   1. No parenchymal abnormality in the previously identified area of concern  in the left hepatic lobe.  2. Similar-appearing atrophy of the left hepatic lobe with mildly increased  intrahepatic bile duct dilation.  3. Multifocal areas of arterial enhancement in the liver. At least 2 of  these demonstrate uptake on the hepatobiliary phase. Findings could  represent a combination of focal perfusion anomalies and FNH.  4. Ill-defined soft tissue surrounding the celiac trunk is better assessed  on same day CT.     08/31/2020 Imaging   CT CAP at [facility]  Impression:   1. Increased recurrent soft tissue along the celiac trunk and common and  proper hepatic arteries with unchanged associated occlusion of the  intrahepatic left portal vein and occlusion of the accessory left hepatic  artery. Increased associated narrowing of the common and proper hepatic  arteries.  2. Increased intrahepatic biliary duct dilation likely due to  aforementioned malignant soft tissue tracking into the liver hilum. Suggest  correlation with lab values for biliary obstruction.       REVIEW OF SYSTEMS:   Constitutional: Afebrile for the past 24 hours Eyes: Denies blurriness of vision Ears, nose, mouth, throat, and face: Denies mucositis or sore throat Respiratory: Denies cough, dyspnea or wheezes Cardiovascular: Denies palpitation, chest discomfort Gastrointestinal:  Denies nausea, heartburn or change in bowel habits Skin: Denies abnormal skin rashes Lymphatics: Denies new lymphadenopathy or easy bruising Neurological:Denies numbness, tingling or new weaknesses Behavioral/Psych: Mood is stable, no new changes   Extremities: No lower extremity edema All other systems were reviewed with the patient and are negative.  I have reviewed the past medical history, past surgical history, social history and family history with the patient and they are unchanged from previous note.   PHYSICAL EXAMINATION: ECOG PERFORMANCE STATUS: 0 - Asymptomatic  Vitals:   10/20/20 1935 10/21/20 0535  BP: 127/64 (!) 150/71  Pulse: 77 75  Resp: 16 16  Temp: 99.5 F (37.5 C) 99.3 F (37.4 C)  SpO2: 95% 94%   Filed Weights   10/18/20 2330  Weight: 88.9 kg    Intake/Output from previous day: 03/23 0701 - 03/24 0700 In: 909.3 [P.O.:710; IV Piggyback:199.3] Out: 2400 [Urine:2400]  GENERAL:alert, no distress and comfortable SKIN: skin color, texture, turgor are normal, no rashes or significant lesions EYES: normal, Conjunctiva are pink and non-injected, sclera clear OROPHARYNX:no exudate, no erythema and lips, buccal mucosa, and tongue normal  LUNGS: clear to auscultation and percussion with normal breathing effort HEART: regular rate & rhythm and no murmurs and no lower extremity edema ABDOMEN:abdomen soft, non-tender and normal bowel sounds  NEURO: alert & oriented x 3 with fluent speech, no focal motor/sensory deficits  LABORATORY DATA:  I have reviewed the data as listed CMP Latest Ref Rng & Units 10/21/2020 10/20/2020 10/19/2020  Glucose 70 - 99 mg/dL 217(H) 202(H) -  BUN 8 - 23 mg/dL 19 20 -  Creatinine 0.61 - 1.24 mg/dL 0.78 0.91 -  Sodium 135 - 145 mmol/L 133(L) 138 -  Potassium 3.5 - 5.1 mmol/L 3.4(L) 3.5 -  Chloride 98 - 111 mmol/L 102 105 -  CO2 22 - 32 mmol/L 24 26 -  Calcium 8.9 - 10.3 mg/dL 7.5(L) 7.9(L) -  Total Protein 6.5 - 8.1 g/dL 6.5 6.6 6.8  Total Bilirubin 0.3 - 1.2 mg/dL 0.7 1.0 0.9  Alkaline Phos 38 - 126 U/L 222(H) 254(H) 270(H)  AST 15 - 41 U/L 31 19 22   ALT 0 - 44 U/L 25 23 30     Lab Results  Component Value Date   WBC 16.1 (H) 10/21/2020   HGB 10.0 (L) 10/21/2020   HCT  30.1 (L) 10/21/2020   MCV 90.1 10/21/2020   PLT 406 (H) 10/21/2020   NEUTROABS 13.4 (H) 10/21/2020    DG Chest 1 View  Result Date: 10/18/2020 CLINICAL DATA:  Fall, pain and RIGHT hip. EXAM: CHEST  1 VIEW COMPARISON:  June 19, 2017 FINDINGS: Trachea midline. Cardiomediastinal contours and hilar structures are normal. Signs of atelectasis and scarring at the lung bases. Evidence of cervical spinal fusion partially imaged on the current study. On limited assessment no acute skeletal process. IMPRESSION: Signs of atelectasis and scarring at the lung bases. Electronically Signed   By: Zetta Bills M.D.   On: 10/18/2020 19:12   DG Knee Right Port  Result Date: 10/19/2020 CLINICAL  DATA:  History of distal femur fracture. EXAM: PORTABLE RIGHT KNEE - 1-2 VIEW COMPARISON:  No prior. FINDINGS: Limited two view of exam obtained. Mild soft tissue swelling about the knee cannot be excluded. Severe Tricompartment degenerative change with chondrocalcinosis. Deformity noted of the proximal fibula consistent with old healed fracture. No acute fracture identified. IMPRESSION: 1.  Mild soft tissue swelling about the knee cannot be excluded. 2. Severe tricompartment degenerative change with chondrocalcinosis. Deformity noted the proximal right fibula consistent with old healed fracture. No acute bony abnormality identified. Electronically Signed   By: Marcello Moores  Register   On: 10/19/2020 09:46   DG Hip Unilat With Pelvis 2-3 Views Right  Result Date: 10/18/2020 CLINICAL DATA:  Fall, hip pain status post fall EXAM: DG HIP (WITH OR WITHOUT PELVIS) 2-3V RIGHT COMPARISON:  Prior CT imaging of the chest, abdomen and pelvis. FINDINGS: No fracture of the bony pelvis.  Spinal degenerative changes. Comminuted RIGHT proximal femoral fracture, intratrochanteric type with distraction of the lesser trochanter. Mild displacement of distal femur relative to proximal femur, slight anterior displacement 2-3 mm. Mild to moderate varus  angulation at the fracture site. Sclerotic lesion in the LEFT iliac bone unchanged likely large bone island. IMPRESSION: Comminuted mildly angulated RIGHT intratrochanteric fracture. Electronically Signed   By: Zetta Bills M.D.   On: 10/18/2020 19:17    ASSESSMENT AND PLAN: 1.  Locally recurrent pancreatic adenocarcinoma 2.  SIRS/UTI, improved 3.  Right hip fracture 4.  Type 2 diabetes mellitus 5.  Hypertension 6.  Hyperlipidemia 7.  Chronic diarrhea secondary to prior Whipple surgery  -The patient's recurrent pancreatic cancer is unresectable.  He has been referred to radiation oncology who is aware of their admission.  Hopefully they can consult with him while he is in the hospital to determine further plans per radiation.  May consider addition of chemotherapy depending on what radiation plans to do. -Continue antibiotics per hospitalist for UTI. -Ortho was following for right hip fracture.  Tentatively to go to the OR later today. -Continue Creon for his chronic diarrhea. -Management of chronic medical conditions per hospitalist.   LOS: 3 days   Mikey Bussing, DNP, AGPCNP-BC, AOCNP 10/21/20  Addendum  I have seen the patient, examined him. I agree with the assessment and and plan and have edited the notes.   Mr. Fiumara surgery is currently being postponed due to UTI and bacteriemia. He is anxious about that and also wants to know his radiation plan for his recurrent pancreatic cancer. We again reviewed the radiation options. Will past message to Dr. [name] to see if he can see him when he is here. We will f/u as needed.   Truitt Merle  10/21/2020

## 2020-10-21 NOTE — Consult Note (Signed)
[place] for Infectious Disease  Total days of antibiotics 2/ceftriaxone       Reason for Consult: streptococcal bacteremia    Referring Physician: powell  Principal Problem:   Closed right hip fracture, initial encounter [facility]) Active Problems:   Essential hypertension   Type 2 diabetes mellitus with other specified complication ([place])   Adenocarcinoma of head of pancreas [facility])   Fall from ground level    HPI: Colton Bush is a 65 y.o. male with hx of HTN, T2DM, recurrence of pancreatic cancer, hx of pancreactomy/whipples. Admitted for ground level fall sustained right hip fracture. On admit found to have leukocytosis attributed to stress from fall. He did subscribe to urinary retention and had high fever up to 102F, with rigors on HD#2. Blood cx grew 1 of 4 blood cx with strep species. His ortho surgery postponed today due to bacteremia. He is now afebrile- he does state he has had rigors for the last month. Also mentions he feels like he had a uti.  Past Medical History:  Diagnosis Date  . Arthritis    left hand  . Bronchitis 1977  . Cancer ([place]) 03/09/2016   pancreatic cancer, sees Dr. Cristino Martes at [facility]   . Depression    takes Cymbalta daily  . Diabetes mellitus type II    sees Dr. Chalmers Cater   . GERD (gastroesophageal reflux disease)    takes Omeprazole daily  . H/O hiatal hernia   . Hyperlipidemia    takes Zocor daily  . Hypertension    takes Amlodipine daily  . Hypoglycemia 06/18/2017  . Neck pain    C4-7 stenosis and herniated disc  . Neuromuscular disorder ([place])    hiatal hernia  . Scoliosis    slight  . Spinal cord injury, C5-C7 ([place])    c4-c7  . Stiffness of hand joint    d/t cervical issues    Allergies: No Known Allergies   MEDICATIONS: . acetaminophen      . amLODipine  10 mg Oral Daily  . Chlorhexidine Gluconate Cloth  6 each Topical Q0600  . insulin aspart  0-15 Units Subcutaneous TID WC  . insulin aspart  0-5 Units Subcutaneous QHS  .  insulin detemir  7 Units Subcutaneous QHS  . insulin detemir  7 Units Subcutaneous Daily  . lipase/protease/amylase  24,000 Units Oral TID with meals  . mupirocin ointment  1 application Nasal BID  . pantoprazole  40 mg Oral Daily  . potassium chloride SA  20 mEq Oral Daily  . rosuvastatin  20 mg Oral Daily    Social History   Tobacco Use  . Smoking status: Never Smoker  . Smokeless tobacco: Never Used  . Tobacco comment: tried a pipe 35 years ago   Vaping Use  . Vaping Use: Never used  Substance Use Topics  . Alcohol use: No    Alcohol/week: 0.0 standard drinks  . Drug use: No    Family History  Problem Relation Age of Onset  . Heart disease Father   . Heart disease Brother 49  . Anesthesia problems Mother   . Heart disease Mother   . Dementia Mother   . Diabetes Sister   . Stroke Sister   . Colon cancer Neg Hx   . Rectal cancer Neg Hx   . Stomach cancer Neg Hx     Review of Systems -  12 point ros is negative except what is mentioned in hpi  OBJECTIVE: Temp:  [97.6  F (36.4 C)-99.5 F (37.5 C)] 97.6 F (36.4 C) (03/24 1326) Pulse Rate:  [74-79] 74 (03/24 1326) Resp:  [16-18] 18 (03/24 1326) BP: (126-150)/(64-81) 139/81 (03/24 1326) SpO2:  [94 %-96 %] 95 % (03/24 1326) Weight:  [88.9 kg] 88.9 kg (03/24 1326) Physical Exam  Constitutional: He is oriented to person, place, and time. He appears well-developed and well-nourished. No distress.  HENT: facial wasting, pallor Mouth/Throat: Oropharynx is clear and moist. No oropharyngeal exudate.  Cardiovascular: Normal rate, regular rhythm and normal heart sounds. Exam reveals no gallop and no friction rub.  Soft systolic murmur Pulmonary/Chest: Effort normal and breath sounds normal. No respiratory distress. He has no wheezes.  Abdominal: Soft. Bowel sounds are normal. He exhibits no distension. There is no tenderness.  Lymphadenopathy:  He has no cervical adenopathy.  Neurological: He is alert and oriented to  person, place, and time.  Skin: Skin is warm and dry. No rash noted. No erythema.  Psychiatric: He has a normal mood and affect. His behavior is normal.     LABS: Results for orders placed or performed during the hospital encounter of 10/18/20 (from the past 48 hour(s))  Glucose, capillary     Status: Abnormal   Collection Time: 10/19/20  4:59 PM  Result Value Ref Range   Glucose-Capillary 139 (H) 70 - 99 mg/dL    Comment: Glucose reference range applies only to samples taken after fasting for at least 8 hours.  Glucose, capillary     Status: Abnormal   Collection Time: 10/19/20  9:52 PM  Result Value Ref Range   Glucose-Capillary 159 (H) 70 - 99 mg/dL    Comment: Glucose reference range applies only to samples taken after fasting for at least 8 hours.  Comprehensive metabolic panel     Status: Abnormal   Collection Time: 10/20/20  3:18 AM  Result Value Ref Range   Sodium 138 135 - 145 mmol/L   Potassium 3.5 3.5 - 5.1 mmol/L   Chloride 105 98 - 111 mmol/L   CO2 26 22 - 32 mmol/L   Glucose, Bld 202 (H) 70 - 99 mg/dL    Comment: Glucose reference range applies only to samples taken after fasting for at least 8 hours.   BUN 20 8 - 23 mg/dL   Creatinine, Ser 0.91 0.61 - 1.24 mg/dL   Calcium 7.9 (L) 8.9 - 10.3 mg/dL   Total Protein 6.6 6.5 - 8.1 g/dL   Albumin 2.1 (L) 3.5 - 5.0 g/dL   AST 19 15 - 41 U/L   ALT 23 0 - 44 U/L   Alkaline Phosphatase 254 (H) 38 - 126 U/L   Total Bilirubin 1.0 0.3 - 1.2 mg/dL   GFR, Estimated >60 >60 mL/min    Comment: (NOTE) Calculated using the CKD-EPI Creatinine Equation (2021)    Anion gap 7 5 - 15    Comment: Performed at [facility], [place] [address] [place], [place] [address]  CBC     Status: Abnormal   Collection Time: 10/20/20  3:18 AM  Result Value Ref Range   WBC 32.4 (H) 4.0 - 10.5 K/uL   RBC 3.44 (L) 4.22 - 5.81 MIL/uL   Hemoglobin 10.4 (L) 13.0 - 17.0 g/dL   HCT 30.7 (L) 39.0 - 52.0 %   MCV 89.2 80.0 - 100.0 fL    MCH 30.2 26.0 - 34.0 pg   MCHC 33.9 30.0 - 36.0 g/dL   RDW 14.4 11.5 - 15.5 %   Platelets  385 150 - 400 K/uL   nRBC 0.0 0.0 - 0.2 %    Comment: Performed at [facility], [place] [address] [place], [place] [address]  Glucose, capillary     Status: Abnormal   Collection Time: 10/20/20  7:48 AM  Result Value Ref Range   Glucose-Capillary 125 (H) 70 - 99 mg/dL    Comment: Glucose reference range applies only to samples taken after fasting for at least 8 hours.  Culture, blood (routine x 2)     Status: None (Preliminary result)   Collection Time: 10/20/20  7:55 AM   Specimen: BLOOD  Result Value Ref Range   Specimen Description      BLOOD RIGHT ANTECUBITAL Performed at [facility], [place] [address], [place], [place] [address]    Special Requests      BOTTLES DRAWN AEROBIC AND ANAEROBIC Blood Culture results may not be optimal due to an inadequate volume of blood received in culture bottles Performed at [facility], [place] [address] [place], [place] [address]    Culture      NO GROWTH < 24 HOURS Performed at [place] [address] [place], [place] [address]    Report Status PENDING   Culture, blood (routine x 2)     Status: None (Preliminary result)   Collection Time: 10/20/20  7:56 AM   Specimen: BLOOD  Result Value Ref Range   Specimen Description      BLOOD RIGHT ANTECUBITAL Performed at [place] [address], [place], [place] [address]    Special Requests      BOTTLES DRAWN AEROBIC AND ANAEROBIC Blood Culture results may not be optimal due to an inadequate volume of blood received in culture bottles Performed at [facility], [place] [address] [place], [place] [address]    Culture  Setup Time      AEROBIC BOTTLE ONLY Organism ID to follow GRAM POSITIVE COCCI CRITICAL RESULT CALLED TO, READ BACK BY AND VERIFIED WITHMelodye Ped [facility] [id number] 10/21/20 A  BROWNING Performed at [facility], [place] [address], [place], [place] [address]    Culture PENDING    Report Status PENDING   Blood Culture ID Panel (Reflexed)     Status: Abnormal   Collection Time: 10/20/20  7:56 AM  Result Value Ref Range   Enterococcus faecalis NOT DETECTED NOT DETECTED   Enterococcus Faecium NOT DETECTED NOT DETECTED   Listeria monocytogenes NOT DETECTED NOT DETECTED   Staphylococcus species NOT DETECTED NOT DETECTED   Staphylococcus aureus (BCID) NOT DETECTED NOT DETECTED   Staphylococcus epidermidis NOT DETECTED NOT DETECTED   Staphylococcus lugdunensis NOT DETECTED NOT DETECTED   Streptococcus species DETECTED (A) NOT DETECTED    Comment: Not Enterococcus species, Streptococcus agalactiae, Streptococcus pyogenes, or Streptococcus pneumoniae. CRITICAL RESULT CALLED TO, READ BACK BY AND VERIFIED WITH: Melodye Ped PHARMD [id number] 10/21/20 A BROWNING    Streptococcus agalactiae NOT DETECTED NOT DETECTED   Streptococcus pneumoniae NOT DETECTED NOT DETECTED   Streptococcus pyogenes NOT DETECTED NOT DETECTED   A.calcoaceticus-baumannii NOT DETECTED NOT DETECTED   Bacteroides fragilis NOT DETECTED NOT DETECTED   Enterobacterales NOT DETECTED NOT DETECTED   Enterobacter cloacae complex NOT DETECTED NOT DETECTED   Escherichia coli NOT DETECTED NOT DETECTED   Klebsiella aerogenes NOT DETECTED NOT DETECTED   Klebsiella oxytoca NOT DETECTED NOT DETECTED   Klebsiella pneumoniae NOT DETECTED NOT DETECTED   Proteus species NOT DETECTED NOT DETECTED  Salmonella species NOT DETECTED NOT DETECTED   Serratia marcescens NOT DETECTED NOT DETECTED   Haemophilus influenzae NOT DETECTED NOT DETECTED   Neisseria meningitidis NOT DETECTED NOT DETECTED   Pseudomonas aeruginosa NOT DETECTED NOT DETECTED   Stenotrophomonas maltophilia NOT DETECTED NOT DETECTED   Candida albicans NOT DETECTED NOT DETECTED   Candida auris NOT DETECTED NOT DETECTED   Candida glabrata NOT DETECTED NOT  DETECTED   Candida krusei NOT DETECTED NOT DETECTED   Candida parapsilosis NOT DETECTED NOT DETECTED   Candida tropicalis NOT DETECTED NOT DETECTED   Cryptococcus neoformans/gattii NOT DETECTED NOT DETECTED    Comment: Performed at [place] [address], [place], [place] [address]  Urinalysis, Routine w reflex microscopic Urine, Catheterized     Status: Abnormal   Collection Time: 10/20/20 10:25 AM  Result Value Ref Range   Color, Urine YELLOW YELLOW   APPearance CLEAR CLEAR   Specific Gravity, Urine 1.020 1.005 - 1.030   pH 5.0 5.0 - 8.0   Glucose, UA NEGATIVE NEGATIVE mg/dL   Hgb urine dipstick MODERATE (A) NEGATIVE   Bilirubin Urine NEGATIVE NEGATIVE   Ketones, ur NEGATIVE NEGATIVE mg/dL   Protein, ur 30 (A) NEGATIVE mg/dL   Nitrite POSITIVE (A) NEGATIVE   Leukocytes,Ua TRACE (A) NEGATIVE   RBC / HPF 6-10 0 - 5 RBC/hpf   WBC, UA 11-20 0 - 5 WBC/hpf   Bacteria, UA MANY (A) NONE SEEN   Mucus PRESENT     Comment: Performed at [facility], [place] [address], [place], [place] [address]  Glucose, capillary     Status: Abnormal   Collection Time: 10/20/20 11:45 AM  Result Value Ref Range   Glucose-Capillary 64 (L) 70 - 99 mg/dL    Comment: Glucose reference range applies only to samples taken after fasting for at least 8 hours.  Glucose, capillary     Status: Abnormal   Collection Time: 10/20/20 12:37 PM  Result Value Ref Range   Glucose-Capillary 114 (H) 70 - 99 mg/dL    Comment: Glucose reference range applies only to samples taken after fasting for at least 8 hours.  Glucose, capillary     Status: Abnormal   Collection Time: 10/20/20  4:33 PM  Result Value Ref Range   Glucose-Capillary 223 (H) 70 - 99 mg/dL    Comment: Glucose reference range applies only to samples taken after fasting for at least 8 hours.  Glucose, capillary     Status: Abnormal   Collection Time: 10/20/20  9:27 PM  Result Value Ref Range   Glucose-Capillary 237 (H) 70 - 99  mg/dL    Comment: Glucose reference range applies only to samples taken after fasting for at least 8 hours.   Comment 1 Notify RN    Comment 2 Document in Chart   Glucose, capillary     Status: Abnormal   Collection Time: 10/20/20 11:57 PM  Result Value Ref Range   Glucose-Capillary 230 (H) 70 - 99 mg/dL    Comment: Glucose reference range applies only to samples taken after fasting for at least 8 hours.   Comment 1 Notify RN    Comment 2 Document in Chart   CBC with Differential/Platelet     Status: Abnormal   Collection Time: 10/21/20  3:05 AM  Result Value Ref Range   WBC 16.1 (H) 4.0 - 10.5 K/uL   RBC 3.34 (L) 4.22 - 5.81 MIL/uL   Hemoglobin 10.0 (L) 13.0 - 17.0 g/dL  HCT 30.1 (L) 39.0 - 52.0 %   MCV 90.1 80.0 - 100.0 fL   MCH 29.9 26.0 - 34.0 pg   MCHC 33.2 30.0 - 36.0 g/dL   RDW 14.4 11.5 - 15.5 %   Platelets 406 (H) 150 - 400 K/uL   nRBC 0.0 0.0 - 0.2 %   Neutrophils Relative % 83 %   Neutro Abs 13.4 (H) 1.7 - 7.7 K/uL   Lymphocytes Relative 10 %   Lymphs Abs 1.6 0.7 - 4.0 K/uL   Monocytes Relative 5 %   Monocytes Absolute 0.8 0.1 - 1.0 K/uL   Eosinophils Relative 1 %   Eosinophils Absolute 0.2 0.0 - 0.5 K/uL   Basophils Relative 0 %   Basophils Absolute 0.1 0.0 - 0.1 K/uL   Immature Granulocytes 1 %   Abs Immature Granulocytes 0.08 (H) 0.00 - 0.07 K/uL    Comment: Performed at [facility], [place] [address] [place], [place] [address]  Comprehensive metabolic panel     Status: Abnormal   Collection Time: 10/21/20  3:05 AM  Result Value Ref Range   Sodium 133 (L) 135 - 145 mmol/L   Potassium 3.4 (L) 3.5 - 5.1 mmol/L   Chloride 102 98 - 111 mmol/L   CO2 24 22 - 32 mmol/L   Glucose, Bld 217 (H) 70 - 99 mg/dL    Comment: Glucose reference range applies only to samples taken after fasting for at least 8 hours.   BUN 19 8 - 23 mg/dL   Creatinine, Ser 0.78 0.61 - 1.24 mg/dL   Calcium 7.5 (L) 8.9 - 10.3 mg/dL   Total Protein 6.5 6.5 - 8.1 g/dL    Albumin 2.1 (L) 3.5 - 5.0 g/dL   AST 31 15 - 41 U/L   ALT 25 0 - 44 U/L   Alkaline Phosphatase 222 (H) 38 - 126 U/L   Total Bilirubin 0.7 0.3 - 1.2 mg/dL   GFR, Estimated >60 >60 mL/min    Comment: (NOTE) Calculated using the CKD-EPI Creatinine Equation (2021)    Anion gap 7 5 - 15    Comment: Performed at [facility], [place] [address], [place], [place] [address]  Magnesium     Status: None   Collection Time: 10/21/20  3:05 AM  Result Value Ref Range   Magnesium 2.2 1.7 - 2.4 mg/dL    Comment: Performed at [facility], [place] [address], [place], [place] [address]  Phosphorus     Status: None   Collection Time: 10/21/20  3:05 AM  Result Value Ref Range   Phosphorus 2.6 2.5 - 4.6 mg/dL    Comment: Performed at [facility], [place] [address] [place], [place] [address]  Glucose, capillary     Status: Abnormal   Collection Time: 10/21/20  3:48 AM  Result Value Ref Range   Glucose-Capillary 190 (H) 70 - 99 mg/dL    Comment: Glucose reference range applies only to samples taken after fasting for at least 8 hours.   Comment 1 Notify RN    Comment 2 Document in Chart   Glucose, capillary     Status: Abnormal   Collection Time: 10/21/20  7:12 AM  Result Value Ref Range   Glucose-Capillary 183 (H) 70 - 99 mg/dL    Comment: Glucose reference range applies only to samples taken after fasting for at least 8 hours.  Glucose, capillary     Status: Abnormal   Collection Time: 10/21/20 11:56  AM  Result Value Ref Range   Glucose-Capillary 148 (H) 70 - 99 mg/dL    Comment: Glucose reference range applies only to samples taken after fasting for at least 8 hours.  Glucose, capillary     Status: Abnormal   Collection Time: 10/21/20  1:26 PM  Result Value Ref Range   Glucose-Capillary 107 (H) 70 - 99 mg/dL    Comment: Glucose reference range applies only to samples taken after fasting for at least 8 hours.    MICRO: reviewed IMAGING: No  results found.  Assessment/Plan:  65yo M with ground level fall associated hip fracture found to also have strep bacteremia  -continue with ceftriaxone 2gm IV daily, until sensitivities return - recommend repeat blood cultures - due to murmur, recommend to start with TTE  ? uti = could be source of infection, would be treated with ceftriaxone. Await urine culture results to see if same as blood cx  Right intertrochanteric hip fractrue and displacement of lesser tronchanter = would recommend to wait til we have cleared his bacteremia before going to or for [facility] placement.  Hx of recurrence of pancreatic cancer = has upcoming outpatient follow up.

## 2020-10-21 NOTE — Progress Notes (Signed)
PROGRESS NOTE    Colton Bush  HBZ:[id number] DOB: Aug 28, 1955 DOA: 10/18/2020 PCP: Colton Morale, MD   Chief Complaint  Patient presents with  . Fall   Brief Narrative:  Colton Bush is Colton Bush 65 y.o. male with Colton Bush history of recurrent pancreatic cancer, diabetes mellitus, hypertension, hyperlipidemia. Patient presented secondary to Colton Bush fall and subsequent right hip fracture. Plan for surgery.  Assessment & Plan:   Principal Problem:   Closed right hip fracture, initial encounter [facility]) Active Problems:   Essential hypertension   Type 2 diabetes mellitus with other specified complication (Colton Bush)   Adenocarcinoma of head of pancreas ([place])   Fall from ground level  Strep Bacteremia  Urinary Tract Infection   SIRS Fever 3/22 PM as well as worsened leukocytosis 3/23 -> seems out of proportion to what would be expected with hip fx due to reactive leukocytosis or simple UTI.   Blood cx with 1/4 with BCID showing strep species Repeat blood cx 3/25 AM Follow pending urine culture Will consult ID, appreciate assistance -> recommending TTE Hold off on surgery for now - will need to ensure bacteremia cleared prior to hardware  Right hip fracture Right comminuted/angulated intertrochanteric fracture. Orthopedic surgery consulted.  Surgery pending workup for bacteremia above.  -Orthopedic surgery recommendations: Surgery -Continue oxycodone and morphine prn  Fall Resulting in above. No syncope.  Diabetes mellitus, type 2 Patient is on Levemir and Novolog sliding scale as an outpatient. Conflicting diagnoses on chart review between type 1 and type 2. Started on Levemir 15 units BID. -hypoglycemia this AM, hold PM levemir, follow -Continue Levemir and SSI  Pancreatic cancer Recurrence. Patient follows with Colton Bush as an outpatient. Plan for radiation therapy as an outpatient. Patient plans to follow up with Dr. Lisbeth Bush, radiation oncology. -Continue Creon -added Colton Bush to treatment  team  Primary hypertension -Continue amlodipine  Hyperlipidemia -Continue Colton Bush  GERD -Continue Colton Bush  DVT prophylaxis: heparin - hold after 3/25 PM dose, reevaluate Code Status: full  Family Communication: wife at bedside Disposition:   Status is: Inpatient  Remains inpatient appropriate because:Inpatient level of care appropriate due to severity of illness   Dispo: The patient is from: Home              Anticipated d/c is to: pending              Patient currently is not medically stable to d/c.   Difficult to place patient No   Consultants:   ortho  Procedures: none  Antimicrobials:  Anti-infectives (From admission, onward)   Start     Dose/Rate Route Frequency Ordered Stop   10/22/20 1000  cefTRIAXone (ROCEPHIN) 2 g in sodium chloride 0.9 % 100 mL IVPB        2 g 200 mL/hr over 30 Minutes Intravenous Every 24 hours 10/21/20 1343     10/21/20 1400  cefTRIAXone (ROCEPHIN) 1 g in sodium chloride 0.9 % 100 mL IVPB        1 g 200 mL/hr over 30 Minutes Intravenous  Once 10/21/20 1343 10/21/20 1624   10/21/20 1330  ceFAZolin (ANCEF) IVPB 2g/100 mL premix  Status:  Discontinued        2 g 200 mL/hr over 30 Minutes Intravenous On call to O.R. 10/21/20 1323 10/21/20 1350   10/21/20 1330  ceFAZolin (ANCEF) IVPB 2g/100 mL premix  Status:  Discontinued        2 g 200 mL/hr over 30 Minutes Intravenous  Once 10/21/20 1324  10/21/20 1325   10/21/20 1300  ceFAZolin (ANCEF) 2-4 GM/100ML-% IVPB       Note to Pharmacy: Colton Bush   : cabinet override      10/21/20 1300 10/22/20 0114   10/20/20 1230  cefTRIAXone (ROCEPHIN) 1 g in sodium chloride 0.9 % 100 mL IVPB  Status:  Discontinued        1 g 200 mL/hr over 30 Minutes Intravenous Every 24 hours 10/20/20 1141 10/21/20 1354   10/20/20 0600  ceFAZolin (ANCEF) IVPB 2g/100 mL premix        2 g 200 mL/hr over 30 Minutes Intravenous On call to O.R. 10/19/20 2233 10/21/20 0559     Subjective: Frustrated he's going  to have to wait again  Objective: Vitals:   10/20/20 1719 10/20/20 1935 10/21/20 0535 10/21/20 1326  BP: 126/66 127/64 (!) 150/71 139/81  Pulse: 79 77 75 74  Resp: 16 16 16 18   Temp: 98.2 F (36.8 C) 99.5 F (37.5 C) 99.3 F (37.4 C) 97.6 F (36.4 C)  TempSrc:  Oral Oral Oral  SpO2: 96% 95% 94% 95%  Weight:    88.9 kg  Height:    5\' 10"  (1.778 m)    Intake/Output Summary (Last 24 hours) at 10/21/2020 1712 Last data filed at 10/21/2020 1048 Gross per 24 hour  Intake 220 ml  Output 2200 ml  Net -1980 ml   Filed Weights   10/18/20 2330 10/21/20 1326  Weight: 88.9 kg 88.9 kg    Examination:  General: No acute distress. Cardiovascular: Heart sounds show Colton Bush regular rate, and rhythm. Lungs: Clear to auscultation bilaterally  Abdomen: Soft, nontender, nondistended  Neurological: Alert and oriented 3. Moves all extremities 4. Cranial nerves II through XII grossly intact. Extremities: palpable distal pulse - shortened and externally rotated RLE    Data Reviewed: I have personally reviewed following labs and imaging studies  CBC: Recent Labs  Lab 10/18/20 1911 10/19/20 0259 10/20/20 0318 10/21/20 0305  WBC 23.6* 14.7* 32.4* 16.1*  NEUTROABS 21.4*  --   --  13.4*  HGB 11.6* 10.7* 10.4* 10.0*  HCT 34.4* 31.3* 30.7* 30.1*  MCV 90.1 88.4 89.2 90.1  PLT 366 373 385 406*    Basic Metabolic Panel: Recent Labs  Lab 10/18/20 1911 10/19/20 0259 10/20/20 0318 10/21/20 0305  NA 135 135 138 133*  K 3.7 3.4* 3.5 3.4*  CL 100 102 105 102  CO2 25 27 26 24   GLUCOSE 269* 216* 202* 217*  BUN 19 14 20 19   CREATININE 0.70 0.53* 0.91 0.78  CALCIUM 8.0* 7.9* 7.9* 7.5*  MG  --  2.1  --  2.2  PHOS  --  2.8  --  2.6    GFR: Estimated Creatinine Clearance: 100.6 mL/min (by C-G formula based on SCr of 0.78 mg/dL).  Liver Function Tests: Recent Labs  Lab 10/19/20 0259 10/20/20 0318 10/21/20 0305  AST 22  21 19 31   ALT 30  31 23 25   ALKPHOS 270*  276* 254* 222*   BILITOT 0.9  1.0 1.0 0.7  PROT 6.8  6.8 6.6 6.5  ALBUMIN 2.3*  2.4* 2.1* 2.1*    CBG: Recent Labs  Lab 10/21/20 0348 10/21/20 0712 10/21/20 1156 10/21/20 1326 10/21/20 1704  GLUCAP 190* 183* 148* 107* 212*     Recent Results (from the past 240 hour(s))  Resp Panel by RT-PCR (Flu Tezra Mahr&B, Covid) Nasopharyngeal Swab     Status: None   Collection Time: 10/18/20  7:11 PM  Specimen: Nasopharyngeal Swab; Nasopharyngeal(NP) swabs in vial transport medium  Result Value Ref Range Status   SARS Coronavirus 2 by RT PCR NEGATIVE NEGATIVE Final    Comment: (NOTE) SARS-CoV-2 target nucleic acids are NOT DETECTED.  The SARS-CoV-2 RNA is generally detectable in upper respiratory specimens during the acute phase of infection. The lowest concentration of SARS-CoV-2 viral copies this assay can detect is 138 copies/mL. Hasaan Radde negative result does not preclude SARS-Cov-2 infection and should not be used as the sole basis for treatment or other patient management decisions. Reneshia Zuccaro negative result may occur with  improper specimen collection/handling, submission of specimen other than nasopharyngeal swab, presence of viral mutation(s) within the areas targeted by this assay, and inadequate number of viral copies(<138 copies/mL). Bernetta Sutley negative result must be combined with clinical observations, patient history, and epidemiological information. The expected result is Negative.  Fact Sheet for Patients:  EntrepreneurPulse.com.au  Fact Sheet for Healthcare Providers:  IncredibleEmployment.be  This test is no t yet approved or cleared by the [place] FDA and  has been authorized for detection and/or diagnosis of SARS-CoV-2 by FDA under an Emergency Use Authorization (EUA). This EUA will remain  in effect (meaning this test can be used) for the duration of the COVID-19 declaration under Section 564(b)(1) of the Act, 21 U.S.C.section 360bbb-3(b)(1), unless the  authorization is terminated  or revoked sooner.       Influenza Tiwanda Threats by PCR NEGATIVE NEGATIVE Final   Influenza B by PCR NEGATIVE NEGATIVE Final    Comment: (NOTE) The Xpert Xpress SARS-CoV-2/FLU/RSV plus assay is intended as an aid in the diagnosis of influenza from Nasopharyngeal swab specimens and should not be used as Nik Gorrell sole basis for treatment. Nasal washings and aspirates are unacceptable for Xpert Xpress SARS-CoV-2/FLU/RSV testing.  Fact Sheet for Patients: EntrepreneurPulse.com.au  Fact Sheet for Healthcare Providers: IncredibleEmployment.be  This test is not yet approved or cleared by the [place] FDA and has been authorized for detection and/or diagnosis of SARS-CoV-2 by FDA under an Emergency Use Authorization (EUA). This EUA will remain in effect (meaning this test can be used) for the duration of the COVID-19 declaration under Section 564(b)(1) of the Act, 21 U.S.C. section 360bbb-3(b)(1), unless the authorization is terminated or revoked.  Performed at [facility], [place] [address], [place], [place] [address]   Surgical pcr screen     Status: Abnormal   Collection Time: 10/19/20 12:47 AM   Specimen: Nasal Mucosa; Nasal Swab  Result Value Ref Range Status   MRSA, PCR NEGATIVE NEGATIVE Final   Staphylococcus aureus POSITIVE (Leticia Coletta) NEGATIVE Final    Comment: RESULT CALLED TO, READ BACK BY AND VERIFIED WITH: BOBBY, RN @ [phone] ON 10/19/20 C VARNER (NOTE) The Xpert SA Assay (FDA approved for NASAL specimens in patients 76 years of age and older), is one component of Shamell Suarez comprehensive surveillance program. It is not intended to diagnose infection nor to guide or monitor treatment. Performed at [facility], [place] [address], [place], [place] [address]   Culture, blood (routine x 2)     Status: None (Preliminary result)   Collection Time: 10/20/20  7:55 AM   Specimen: BLOOD  Result Value Ref Range  Status   Specimen Description   Final    BLOOD RIGHT ANTECUBITAL Performed at [facility] [address] [place], [place] [address]    Special Requests   Final    BOTTLES DRAWN AEROBIC AND ANAEROBIC Blood Culture results may not be  optimal due to an inadequate volume of blood received in culture bottles Performed at [facility] [address], [place], [place] [address]    Culture   Final    NO GROWTH < 24 HOURS Performed at [facility] [address] [place], [place] [address]    Report Status PENDING  Incomplete  Culture, blood (routine x 2)     Status: None (Preliminary result)   Collection Time: 10/20/20  7:56 AM   Specimen: BLOOD  Result Value Ref Range Status   Specimen Description   Final    BLOOD RIGHT ANTECUBITAL Performed at [facility] [address] [place], [place] [address]    Special Requests   Final    BOTTLES DRAWN AEROBIC AND ANAEROBIC Blood Culture results may not be optimal due to an inadequate volume of blood received in culture bottles Performed at [facility] [address] [place], [place] [address]    Culture  Setup Time   Final    AEROBIC BOTTLE ONLY Organism ID to follow GRAM POSITIVE COCCI CRITICAL RESULT CALLED TO, READ BACK BY AND VERIFIED WITHMelodye Ped [facility] [id number] 10/21/20 Lucy Woolever BROWNING Performed at [facility], [place] [address], [place], [place] [address]    Culture PENDING  Incomplete   Report Status PENDING  Incomplete  Blood Culture ID Panel (Reflexed)     Status: Abnormal   Collection Time: 10/20/20  7:56 AM  Result Value Ref Range Status   Enterococcus faecalis NOT DETECTED NOT DETECTED Final   Enterococcus Faecium NOT DETECTED NOT DETECTED Final   Listeria monocytogenes NOT DETECTED NOT DETECTED Final   Staphylococcus species NOT DETECTED NOT DETECTED Final   Staphylococcus aureus (BCID) NOT DETECTED NOT DETECTED Final   Staphylococcus  epidermidis NOT DETECTED NOT DETECTED Final   Staphylococcus lugdunensis NOT DETECTED NOT DETECTED Final   Streptococcus species DETECTED ([place] Hippert) NOT DETECTED Final    Comment: Not Enterococcus species, Streptococcus agalactiae, Streptococcus pyogenes, or Streptococcus pneumoniae. CRITICAL RESULT CALLED TO, READ BACK BY AND VERIFIED WITH: Melodye Ped PHARMD 1316 10/21/20 Alyne Martinson BROWNING    Streptococcus agalactiae NOT DETECTED NOT DETECTED Final   Streptococcus pneumoniae NOT DETECTED NOT DETECTED Final   Streptococcus pyogenes NOT DETECTED NOT DETECTED Final   Rayni Nemitz.calcoaceticus-baumannii NOT DETECTED NOT DETECTED Final   Bacteroides fragilis NOT DETECTED NOT DETECTED Final   Enterobacterales NOT DETECTED NOT DETECTED Final   Enterobacter cloacae complex NOT DETECTED NOT DETECTED Final   Escherichia coli NOT DETECTED NOT DETECTED Final   Klebsiella aerogenes NOT DETECTED NOT DETECTED Final   Klebsiella oxytoca NOT DETECTED NOT DETECTED Final   Klebsiella pneumoniae NOT DETECTED NOT DETECTED Final   Proteus species NOT DETECTED NOT DETECTED Final   Salmonella species NOT DETECTED NOT DETECTED Final   Serratia marcescens NOT DETECTED NOT DETECTED Final   Haemophilus influenzae NOT DETECTED NOT DETECTED Final   Neisseria meningitidis NOT DETECTED NOT DETECTED Final   Pseudomonas aeruginosa NOT DETECTED NOT DETECTED Final   Stenotrophomonas maltophilia NOT DETECTED NOT DETECTED Final   Candida albicans NOT DETECTED NOT DETECTED Final   Candida auris NOT DETECTED NOT DETECTED Final   Candida glabrata NOT DETECTED NOT DETECTED Final   Candida krusei NOT DETECTED NOT DETECTED Final   Candida parapsilosis NOT DETECTED NOT DETECTED Final   Candida tropicalis NOT DETECTED NOT DETECTED Final   Cryptococcus neoformans/gattii NOT DETECTED NOT DETECTED Final    Comment: Performed at [facility], [address] [place], [place] [address]         Radiology Studies: No results  found.      Scheduled Meds: . acetaminophen      . amLODipine  10 mg Oral Daily  . Chlorhexidine Gluconate Cloth  6 each Topical Q0600  . insulin aspart  0-15 Units Subcutaneous TID WC  . insulin aspart  0-5 Units Subcutaneous QHS  . insulin detemir  7 Units Subcutaneous QHS  . insulin detemir  7 Units Subcutaneous Daily  . lipase/protease/amylase  24,000 Units Oral TID with meals  . mupirocin ointment  1 application Nasal BID  . pantoprazole  40 mg Oral Daily  . potassium chloride SA  20 mEq Oral Daily  . rosuvastatin  20 mg Oral Daily   Continuous Infusions: . ceFAZolin    . [START ON 10/22/2020] cefTRIAXone (ROCEPHIN)  IV    . methocarbamol (ROBAXIN) IV 500 mg (10/20/20 2319)  . tranexamic acid       LOS: 3 days    Time spent: over 30 min    Fayrene Helper, MD [facility]   To contact the attending provider between 7A-7P or the covering provider during after hours 7P-7A, please log into the web site [email or web address] and access using universal [place] password for that web site. If you do not have the password, please call the hospital operator.  10/21/2020, 5:12 PM

## 2020-10-21 NOTE — Plan of Care (Signed)
  Problem: Pain Management: Goal: Pain level will decrease Outcome: Progressing   

## 2020-10-21 NOTE — Telephone Encounter (Signed)
Called Colton Bush to reschedule today's consult with Dr. [name] due to patient's surgery being rescheduled to today. No answer, LVM for a return call.

## 2020-10-21 NOTE — Progress Notes (Signed)
PHARMACY - PHYSICIAN COMMUNICATION CRITICAL VALUE ALERT - BLOOD CULTURE IDENTIFICATION (BCID)  Colton Bush is an 65 y.o. male who presented to [facility] on 10/18/2020 with a chief complaint of  Chief Complaint  Patient presents with  . Fall     Assessment:  SIRS/UTI  Name of physician (or Provider) Contacted: Dr. Florene Glen   Current antibiotics: ceftriaxone  Changes to prescribed antibiotics recommended:  Increase dose to ceftriaxone 2 gr IV q24h   Results for orders placed or performed during the hospital encounter of 10/18/20  Blood Culture ID Panel (Reflexed) (Collected: 10/20/2020  7:56 AM)  Result Value Ref Range   Enterococcus faecalis NOT DETECTED NOT DETECTED   Enterococcus Faecium NOT DETECTED NOT DETECTED   Listeria monocytogenes NOT DETECTED NOT DETECTED   Staphylococcus species NOT DETECTED NOT DETECTED   Staphylococcus aureus (BCID) NOT DETECTED NOT DETECTED   Staphylococcus epidermidis NOT DETECTED NOT DETECTED   Staphylococcus lugdunensis NOT DETECTED NOT DETECTED   Streptococcus species DETECTED (A) NOT DETECTED   Streptococcus agalactiae NOT DETECTED NOT DETECTED   Streptococcus pneumoniae NOT DETECTED NOT DETECTED   Streptococcus pyogenes NOT DETECTED NOT DETECTED   A.calcoaceticus-baumannii NOT DETECTED NOT DETECTED   Bacteroides fragilis NOT DETECTED NOT DETECTED   Enterobacterales NOT DETECTED NOT DETECTED   Enterobacter cloacae complex NOT DETECTED NOT DETECTED   Escherichia coli NOT DETECTED NOT DETECTED   Klebsiella aerogenes NOT DETECTED NOT DETECTED   Klebsiella oxytoca NOT DETECTED NOT DETECTED   Klebsiella pneumoniae NOT DETECTED NOT DETECTED   Proteus species NOT DETECTED NOT DETECTED   Salmonella species NOT DETECTED NOT DETECTED   Serratia marcescens NOT DETECTED NOT DETECTED   Haemophilus influenzae NOT DETECTED NOT DETECTED   Neisseria meningitidis NOT DETECTED NOT DETECTED   Pseudomonas aeruginosa NOT DETECTED NOT DETECTED    Stenotrophomonas maltophilia NOT DETECTED NOT DETECTED   Candida albicans NOT DETECTED NOT DETECTED   Candida auris NOT DETECTED NOT DETECTED   Candida glabrata NOT DETECTED NOT DETECTED   Candida krusei NOT DETECTED NOT DETECTED   Candida parapsilosis NOT DETECTED NOT DETECTED   Candida tropicalis NOT DETECTED NOT DETECTED   Cryptococcus neoformans/gattii NOT DETECTED NOT DETECTED     Royetta Asal, PharmD, BCPS 10/21/2020 1:45 PM

## 2020-10-21 NOTE — Progress Notes (Signed)
Discussed patient with Dr. Florene Glen, [employer]. Due to (+) blood culture ID panel, he recommends postponing surgery. Plan for surgery Saturday am if ready.

## 2020-10-21 NOTE — Care Management Important Message (Signed)
Important Message  Patient Details IM Letter given to the Patient. Name: Colton Bush MRN: [id number] Date of Birth: [birth year]   Medicare Important Message Given:  Yes     Kerin Salen 10/21/2020, 12:07 PM

## 2020-10-22 ENCOUNTER — Inpatient Hospital Stay (HOSPITAL_COMMUNITY): Payer: Medicare Other

## 2020-10-22 DIAGNOSIS — E119 Type 2 diabetes mellitus without complications: Secondary | ICD-10-CM

## 2020-10-22 DIAGNOSIS — N390 Urinary tract infection, site not specified: Secondary | ICD-10-CM | POA: Diagnosis not present

## 2020-10-22 DIAGNOSIS — Z794 Long term (current) use of insulin: Secondary | ICD-10-CM | POA: Diagnosis not present

## 2020-10-22 DIAGNOSIS — R7881 Bacteremia: Secondary | ICD-10-CM | POA: Diagnosis not present

## 2020-10-22 DIAGNOSIS — S72001A Fracture of unspecified part of neck of right femur, initial encounter for closed fracture: Secondary | ICD-10-CM

## 2020-10-22 LAB — COMPREHENSIVE METABOLIC PANEL
ALT: 25 U/L (ref 0–44)
AST: 22 U/L (ref 15–41)
Albumin: 2.2 g/dL — ABNORMAL LOW (ref 3.5–5.0)
Alkaline Phosphatase: 210 U/L — ABNORMAL HIGH (ref 38–126)
Anion gap: 7 (ref 5–15)
BUN: 15 mg/dL (ref 8–23)
CO2: 23 mmol/L (ref 22–32)
Calcium: 7.7 mg/dL — ABNORMAL LOW (ref 8.9–10.3)
Chloride: 103 mmol/L (ref 98–111)
Creatinine, Ser: 0.76 mg/dL (ref 0.61–1.24)
GFR, Estimated: >60 mL/min (ref >60)
Glucose, Bld: 189 mg/dL — ABNORMAL HIGH (ref 70–99)
Potassium: 3.6 mmol/L (ref 3.5–5.1)
Sodium: 133 mmol/L — ABNORMAL LOW (ref 135–145)
Total Bilirubin: 0.8 mg/dL (ref 0.3–1.2)
Total Protein: 7 g/dL (ref 6.5–8.1)

## 2020-10-22 LAB — CBC WITH DIFFERENTIAL/PLATELET
Abs Immature Granulocytes: 0.15 10*3/uL — ABNORMAL HIGH (ref 0.00–0.07)
Basophils Absolute: 0.1 10*3/uL (ref 0.0–0.1)
Basophils Relative: 1 %
Eosinophils Absolute: 0.2 10*3/uL (ref 0.0–0.5)
Eosinophils Relative: 2 %
HCT: 31.5 % — ABNORMAL LOW (ref 39.0–52.0)
Hemoglobin: 10.3 g/dL — ABNORMAL LOW (ref 13.0–17.0)
Immature Granulocytes: 1 %
Lymphocytes Relative: 10 %
Lymphs Abs: 1.3 10*3/uL (ref 0.7–4.0)
MCH: 29.4 pg (ref 26.0–34.0)
MCHC: 32.7 g/dL (ref 30.0–36.0)
MCV: 90 fL (ref 80.0–100.0)
Monocytes Absolute: 0.9 10*3/uL (ref 0.1–1.0)
Monocytes Relative: 7 %
Neutro Abs: 10.9 10*3/uL — ABNORMAL HIGH (ref 1.7–7.7)
Neutrophils Relative %: 79 %
Platelets: 446 10*3/uL — ABNORMAL HIGH (ref 150–400)
RBC: 3.5 MIL/uL — ABNORMAL LOW (ref 4.22–5.81)
RDW: 14.6 % (ref 11.5–15.5)
WBC: 13.6 10*3/uL — ABNORMAL HIGH (ref 4.0–10.5)
nRBC: 0 % (ref 0.0–0.2)

## 2020-10-22 LAB — MAGNESIUM: Magnesium: 2 mg/dL (ref 1.7–2.4)

## 2020-10-22 LAB — GLUCOSE, CAPILLARY
Glucose-Capillary: 174 mg/dL — ABNORMAL HIGH (ref 70–99)
Glucose-Capillary: 190 mg/dL — ABNORMAL HIGH (ref 70–99)
Glucose-Capillary: 193 mg/dL — ABNORMAL HIGH (ref 70–99)
Glucose-Capillary: 202 mg/dL — ABNORMAL HIGH (ref 70–99)
Glucose-Capillary: 252 mg/dL — ABNORMAL HIGH (ref 70–99)
Glucose-Capillary: 254 mg/dL — ABNORMAL HIGH (ref 70–99)

## 2020-10-22 LAB — ECHOCARDIOGRAM COMPLETE
Area-P 1/2: 7.16 cm2
Height: 70 in
S' Lateral: 2.7 cm
Weight: 3135.82 oz

## 2020-10-22 LAB — PHOSPHORUS: Phosphorus: 2.7 mg/dL (ref 2.5–4.6)

## 2020-10-22 MED ORDER — INSULIN DETEMIR 100 UNIT/ML ~~LOC~~ SOLN
10.0000 [IU] | Freq: Every day | SUBCUTANEOUS | Status: DC
  Administered 2020-10-22: 10 [IU] via SUBCUTANEOUS
  Filled 2020-10-22 (×2): qty 0.1

## 2020-10-22 NOTE — Progress Notes (Signed)
Inpatient Diabetes Program Recommendations  AACE/ADA: New Consensus Statement on Inpatient Glycemic Control (2015)  Target Ranges:  Prepandial:   less than 140 mg/dL      Peak postprandial:   less than 180 mg/dL (1-2 hours)      Critically ill patients:  140 - 180 mg/dL   Lab Results  Component Value Date   GLUCAP 202 (H) 10/22/2020   HGBA1C 10.8 (H) 10/18/2020    Review of Glycemic Control  Diabetes history: DM2 Outpatient Diabetes medications: Levemir 35 units in am and 25 units QHS, Novolog 3-12 units TID with meals Current orders for Inpatient glycemic control: Levemir 15 units in am and 7 units QHS, Novolog 0-15 units TID with meals and 0-5 HS  FBS - 202 mg/dL Eating 80-100% meals  Inpatient Diabetes Program Recommendations:     Increase PM dose of Levemir to 10 units.  Continue to follow.  Thank you. Lorenda Peck, RD, LDN, CDE Inpatient Diabetes Coordinator [phone]

## 2020-10-22 NOTE — Progress Notes (Signed)
  Echocardiogram 2D Echocardiogram has been performed.  Colton Bush 10/22/2020, 2:26 PM

## 2020-10-22 NOTE — Plan of Care (Signed)
Pt has reported improvement in pain and desire for surgery. He has expressed disappointment r/t infection and the postponement of surgery, but remains hopeful. Pt remains in bed and unable to perform many ADLs related to fracture. VS stable, pt afebrile, and urine output is good.

## 2020-10-22 NOTE — Progress Notes (Signed)
OT Cancellation Note  Patient Details Name: Colton Bush MRN: [id number] DOB: 15-Apr-1956   Cancelled Treatment:    Reason Eval/Treat Not Completed: Patient not medically ready. Hip surgery tentatively planned for Saturday. Will continue to follow.   Lenward Chancellor 10/22/2020, 6:38 AM

## 2020-10-22 NOTE — Progress Notes (Addendum)
PROGRESS NOTE    Colton Bush  GOT:[id number] DOB: 1955-09-23 DOA: 10/18/2020 PCP: [name], MD   Chief Complaint  Patient presents with  . Fall   Brief Narrative:  Colton Bush is Colton Bush 65 y.o. male with Bethanne Mule history of recurrent pancreatic cancer, diabetes mellitus, hypertension, hyperlipidemia. Patient presented secondary to Shaka Zech fall and subsequent right hip fracture. Plan for surgery.  Assessment & Plan:   Principal Problem:   Closed right hip fracture, initial encounter [facility]) Active Problems:   Essential hypertension   Type 2 diabetes mellitus with other specified complication ([place])   Adenocarcinoma of head of pancreas ([place])   Fall from ground level  Strep Bacteremia  E. Coli Urinary Tract Infection   SIRS Fever 3/22 PM as well as worsened leukocytosis 3/23 -> seems out of proportion to what would be expected with hip fx due to reactive leukocytosis or simple UTI.   Blood cx with 1/4 with BCID showing strep species Repeat blood cx 3/25 AM pending at this time E. Coli UTI, sensitivities pending  Continue ceftriaxone  Will consult ID, appreciate assistance -> recommending TTE (EF 59-74%, grade 1 diastolic dysfunction - without evidence of valvular vegetations - see report - will defer question of need for TEE to ID).  Need repeat cx NGTD x 48 hrs prior to surgery.  Hold off on surgery for now.    Right hip fracture Right comminuted/angulated intertrochanteric fracture. Orthopedic surgery consulted.  Surgery pending workup for bacteremia above.  -Orthopedic surgery recommendations: Surgery -Continue oxycodone and morphine prn  Fall Resulting in above. No syncope.  Diabetes mellitus, type 2 Patient is on Levemir and Novolog sliding scale as an outpatient. Conflicting diagnoses on chart review between type 1 and type 2. Started on Levemir 15 units BID. - continue levemir BID here, adjust as needed in preparation for surgery -Continue Levemir and SSI  Pancreatic  cancer Recurrence. Patient follows with Dr. [name] as an outpatient. Plan for radiation therapy as an outpatient. Patient plans to follow up with Dr. [name], radiation oncology. -Continue Creon -added Dr. [name] to treatment team - see 3/24 note  Primary hypertension -Continue amlodipine  Hyperlipidemia -Continue Crestor  GERD -Continue Protonix  DVT prophylaxis: heparin - hold after 3/26 PM dose, reevaluate Code Status: full  Family Communication: wife at bedside Disposition:   Status is: Inpatient  Remains inpatient appropriate because:Inpatient level of care appropriate due to severity of illness   Dispo: The patient is from: Home              Anticipated d/c is to: pending              Patient currently is not medically stable to d/c.   Difficult to place patient No   Consultants:   ortho  Procedures: Echo 1. Left ventricular ejection fraction, by estimation, is 55 to 60%. The  left ventricle has normal function. The left ventricle has no regional  wall motion abnormalities. There is mild concentric left ventricular  hypertrophy. Left ventricular diastolic  parameters are consistent with Grade I diastolic dysfunction (impaired  relaxation).  2. Right ventricular systolic function is normal. The right ventricular  size is normal.  3. The mitral valve is normal in structure. No evidence of mitral valve  regurgitation. No evidence of mitral stenosis.  4. The aortic valve is normal in structure. Aortic valve regurgitation is  not visualized. No aortic stenosis is present.  5. The inferior vena cava is normal in size with  greater than 50%  respiratory variability, suggesting right atrial pressure of 3 mmHg.   Antimicrobials:  Anti-infectives (From admission, onward)   Start     Dose/Rate Route Frequency Ordered Stop   10/22/20 1000  cefTRIAXone (ROCEPHIN) 2 g in sodium chloride 0.9 % 100 mL IVPB        2 g 200 mL/hr over 30 Minutes Intravenous Every 24 hours  10/21/20 1343     10/21/20 1400  cefTRIAXone (ROCEPHIN) 1 g in sodium chloride 0.9 % 100 mL IVPB        1 g 200 mL/hr over 30 Minutes Intravenous  Once 10/21/20 1343 10/21/20 1624   10/21/20 1330  ceFAZolin (ANCEF) IVPB 2g/100 mL premix  Status:  Discontinued        2 g 200 mL/hr over 30 Minutes Intravenous On call to O.R. 10/21/20 1323 10/21/20 1350   10/21/20 1330  ceFAZolin (ANCEF) IVPB 2g/100 mL premix  Status:  Discontinued        2 g 200 mL/hr over 30 Minutes Intravenous  Once 10/21/20 1324 10/21/20 1325   10/21/20 1300  ceFAZolin (ANCEF) 2-4 GM/100ML-% IVPB       Note to Pharmacy: [name]   : cabinet override      10/21/20 1300 10/22/20 0114   10/20/20 1230  cefTRIAXone (ROCEPHIN) 1 g in sodium chloride 0.9 % 100 mL IVPB  Status:  Discontinued        1 g 200 mL/hr over 30 Minutes Intravenous Every 24 hours 10/20/20 1141 10/21/20 1354   10/20/20 0600  ceFAZolin (ANCEF) IVPB 2g/100 mL premix        2 g 200 mL/hr over 30 Minutes Intravenous On call to O.R. 10/19/20 2233 10/21/20 0559     Subjective: Continued frustration   Objective: Vitals:   10/21/20 0535 10/21/20 1326 10/21/20 2130 10/22/20 1301  BP: (!) 150/71 139/81 (!) 142/70 136/69  Pulse: 75 74 70 79  Resp: 16 18 15 16   Temp: 99.3 F (37.4 C) 97.6 F (36.4 C) 98.3 F (36.8 C) 98.3 F (36.8 C)  TempSrc: Oral Oral Oral Oral  SpO2: 94% 95% 95% 95%  Weight:  88.9 kg    Height:  5\' 10"  (1.778 m)      Intake/Output Summary (Last 24 hours) at 10/22/2020 1652 Last data filed at 10/22/2020 1641 Gross per 24 hour  Intake 760 ml  Output 2425 ml  Net -1665 ml   Filed Weights   10/18/20 2330 10/21/20 1326  Weight: 88.9 kg 88.9 kg    Examination:  General: No acute distress. Cardiovascular: RRR Lungs: unlabored Abdomen: Soft, nontender, nondistended  Neurological: Alert and oriented 3. Moves all extremities 4. Cranial nerves II through XII grossly intact. Skin: Warm and dry. No rashes or  lesions. Extremities: RLE shortened and externally rotated     Data Reviewed: I have personally reviewed following labs and imaging studies  CBC: Recent Labs  Lab 10/18/20 1911 10/19/20 0259 10/20/20 0318 10/21/20 0305 10/22/20 0307  WBC 23.6* 14.7* 32.4* 16.1* 13.6*  NEUTROABS 21.4*  --   --  13.4* 10.9*  HGB 11.6* 10.7* 10.4* 10.0* 10.3*  HCT 34.4* 31.3* 30.7* 30.1* 31.5*  MCV 90.1 88.4 89.2 90.1 90.0  PLT 366 373 385 406* 446*    Basic Metabolic Panel: Recent Labs  Lab 10/18/20 1911 10/19/20 0259 10/20/20 0318 10/21/20 0305 10/22/20 0307  NA 135 135 138 133* 133*  K 3.7 3.4* 3.5 3.4* 3.6  CL 100 102 105 102 103  CO2 25 27 26 24 23   GLUCOSE 269* 216* 202* 217* 189*  BUN 19 14 20 19 15   CREATININE 0.70 0.53* 0.91 0.78 0.76  CALCIUM 8.0* 7.9* 7.9* 7.5* 7.7*  MG  --  2.1  --  2.2 2.0  PHOS  --  2.8  --  2.6 2.7    GFR: Estimated Creatinine Clearance: 100.6 mL/min (by C-G formula based on SCr of 0.76 mg/dL).  Liver Function Tests: Recent Labs  Lab 10/19/20 0259 10/20/20 0318 10/21/20 0305 10/22/20 0307  AST 22  21 19 31 22   ALT 30  31 23 25 25   ALKPHOS 270*  276* 254* 222* 210*  BILITOT 0.9  1.0 1.0 0.7 0.8  PROT 6.8  6.8 6.6 6.5 7.0  ALBUMIN 2.3*  2.4* 2.1* 2.1* 2.2*    CBG: Recent Labs  Lab 10/22/20 0027 10/22/20 0554 10/22/20 0732 10/22/20 1118 10/22/20 1625  GLUCAP 193* 174* 202* 254* 252*     Recent Results (from the past 240 hour(s))  Resp Panel by RT-PCR (Flu Valery Chance&B, Covid) Nasopharyngeal Swab     Status: None   Collection Time: 10/18/20  7:11 PM   Specimen: Nasopharyngeal Swab; Nasopharyngeal(NP) swabs in vial transport medium  Result Value Ref Range Status   SARS Coronavirus 2 by RT PCR NEGATIVE NEGATIVE Final    Comment: (NOTE) SARS-CoV-2 target nucleic acids are NOT DETECTED.  The SARS-CoV-2 RNA is generally detectable in upper respiratory specimens during the acute phase of infection. The lowest concentration of  SARS-CoV-2 viral copies this assay can detect is 138 copies/mL. Bristol Osentoski negative result does not preclude SARS-Cov-2 infection and should not be used as the sole basis for treatment or other patient management decisions. Brodi Kari negative result may occur with  improper specimen collection/handling, submission of specimen other than nasopharyngeal swab, presence of viral mutation(s) within the areas targeted by this assay, and inadequate number of viral copies(<138 copies/mL). Charlese Gruetzmacher negative result must be combined with clinical observations, patient history, and epidemiological information. The expected result is Negative.  Fact Sheet for Patients:  EntrepreneurPulse.com.au  Fact Sheet for Healthcare Providers:  IncredibleEmployment.be  This test is no t yet approved or cleared by the [place] FDA and  has been authorized for detection and/or diagnosis of SARS-CoV-2 by FDA under an Emergency Use Authorization (EUA). This EUA will remain  in effect (meaning this test can be used) for the duration of the COVID-19 declaration under Section 564(b)(1) of the Act, 21 U.S.C.section 360bbb-3(b)(1), unless the authorization is terminated  or revoked sooner.       Influenza Zavon Hyson by PCR NEGATIVE NEGATIVE Final   Influenza B by PCR NEGATIVE NEGATIVE Final    Comment: (NOTE) The Xpert Xpress SARS-CoV-2/FLU/RSV plus assay is intended as an aid in the diagnosis of influenza from Nasopharyngeal swab specimens and should not be used as Jackey Housey sole basis for treatment. Nasal washings and aspirates are unacceptable for Xpert Xpress SARS-CoV-2/FLU/RSV testing.  Fact Sheet for Patients: EntrepreneurPulse.com.au  Fact Sheet for Healthcare Providers: IncredibleEmployment.be  This test is not yet approved or cleared by the [place] FDA and has been authorized for detection and/or diagnosis of SARS-CoV-2 by FDA under an Emergency Use  Authorization (EUA). This EUA will remain in effect (meaning this test can be used) for the duration of the COVID-19 declaration under Section 564(b)(1) of the Act, 21 U.S.C. section 360bbb-3(b)(1), unless the authorization is terminated or revoked.  Performed at [facility], [place] [address] [place], [place] [address]  Surgical pcr screen     Status: Abnormal   Collection Time: 10/19/20 12:47 AM   Specimen: Nasal Mucosa; Nasal Swab  Result Value Ref Range Status   MRSA, PCR NEGATIVE NEGATIVE Final   Staphylococcus aureus POSITIVE (Tavis Kring) NEGATIVE Final    Comment: RESULT CALLED TO, READ BACK BY AND VERIFIED WITH: BOBBY, RN @ [phone] ON 10/19/20 C VARNER (NOTE) The Xpert SA Assay (FDA approved for NASAL specimens in patients 75 years of age and older), is one component of Kadarius Cuffe comprehensive surveillance program. It is not intended to diagnose infection nor to guide or monitor treatment. Performed at [facility], [place] [address] [place], [place] [address]   Culture, blood (routine x 2)     Status: None (Preliminary result)   Collection Time: 10/20/20  7:55 AM   Specimen: BLOOD  Result Value Ref Range Status   Specimen Description   Final    BLOOD RIGHT ANTECUBITAL Performed at [place] [address], [place], [place] [address]    Special Requests   Final    BOTTLES DRAWN AEROBIC AND ANAEROBIC Blood Culture results may not be optimal due to an inadequate volume of blood received in culture bottles Performed at [place] [address] [place], [place] [address]    Culture   Final    NO GROWTH 2 DAYS Performed at [place] [address] [place], [place] [address]    Report Status PENDING  Incomplete  Culture, blood (routine x 2)     Status: None (Preliminary result)   Collection Time: 10/20/20  7:56 AM   Specimen: BLOOD  Result Value Ref Range Status   Specimen Description   Final     BLOOD RIGHT ANTECUBITAL Performed at [facility] [address], [place], [place] [address]    Special Requests   Final    BOTTLES DRAWN AEROBIC AND ANAEROBIC Blood Culture results may not be optimal due to an inadequate volume of blood received in culture bottles Performed at [place] [address] [place], [place] [address]    Culture  Setup Time   Final    AEROBIC BOTTLE ONLY GRAM POSITIVE COCCI CRITICAL RESULT CALLED TO, READ BACK BY AND VERIFIED WITHMelodye Ped [facility] 1740 10/21/20 Travone Georg BROWNING    Culture   Final    CULTURE REINCUBATED FOR BETTER GROWTH Performed at [facility], [place] [address] [place], [place] [address]    Report Status PENDING  Incomplete  Blood Culture ID Panel (Reflexed)     Status: Abnormal   Collection Time: 10/20/20  7:56 AM  Result Value Ref Range Status   Enterococcus faecalis NOT DETECTED NOT DETECTED Final   Enterococcus Faecium NOT DETECTED NOT DETECTED Final   Listeria monocytogenes NOT DETECTED NOT DETECTED Final   Staphylococcus species NOT DETECTED NOT DETECTED Final   Staphylococcus aureus (BCID) NOT DETECTED NOT DETECTED Final   Staphylococcus epidermidis NOT DETECTED NOT DETECTED Final   Staphylococcus lugdunensis NOT DETECTED NOT DETECTED Final   Streptococcus species DETECTED (Kassius Battiste) NOT DETECTED Final    Comment: Not Enterococcus species, Streptococcus agalactiae, Streptococcus pyogenes, or Streptococcus pneumoniae. CRITICAL RESULT CALLED TO, READ BACK BY AND VERIFIED WITH: Melodye Ped PHARMD 1316 10/21/20 Kamyla Olejnik BROWNING    Streptococcus agalactiae NOT DETECTED NOT DETECTED Final   Streptococcus pneumoniae NOT DETECTED NOT DETECTED Final   Streptococcus pyogenes NOT DETECTED NOT DETECTED Final   Jeralynn Vaquera.calcoaceticus-baumannii NOT DETECTED NOT  DETECTED Final   Bacteroides fragilis NOT DETECTED NOT DETECTED Final   Enterobacterales NOT DETECTED NOT DETECTED Final   Enterobacter cloacae complex NOT  DETECTED NOT DETECTED Final   Escherichia coli NOT DETECTED NOT DETECTED Final   Klebsiella aerogenes NOT DETECTED NOT DETECTED Final   Klebsiella oxytoca NOT DETECTED NOT DETECTED Final   Klebsiella pneumoniae NOT DETECTED NOT DETECTED Final   Proteus species NOT DETECTED NOT DETECTED Final   Salmonella species NOT DETECTED NOT DETECTED Final   Serratia marcescens NOT DETECTED NOT DETECTED Final   Haemophilus influenzae NOT DETECTED NOT DETECTED Final   Neisseria meningitidis NOT DETECTED NOT DETECTED Final   Pseudomonas aeruginosa NOT DETECTED NOT DETECTED Final   Stenotrophomonas maltophilia NOT DETECTED NOT DETECTED Final   Candida albicans NOT DETECTED NOT DETECTED Final   Candida auris NOT DETECTED NOT DETECTED Final   Candida glabrata NOT DETECTED NOT DETECTED Final   Candida krusei NOT DETECTED NOT DETECTED Final   Candida parapsilosis NOT DETECTED NOT DETECTED Final   Candida tropicalis NOT DETECTED NOT DETECTED Final   Cryptococcus neoformans/gattii NOT DETECTED NOT DETECTED Final    Comment: Performed at [facility], [name] [address], [place], [place] [address]  Culture, Urine     Status: Abnormal (Preliminary result)   Collection Time: 10/20/20 10:25 AM   Specimen: Urine, Clean Catch  Result Value Ref Range Status   Specimen Description   Final    URINE, CLEAN CATCH Performed at [facility], [place] [address] [place], [place] [address]    Special Requests   Final    NONE Performed at [facility], [place] [address], [place], [place] [address]    Culture (Quiana Cobaugh)  Final    >=100,000 COLONIES/mL ESCHERICHIA COLI SUSCEPTIBILITIES TO FOLLOW Performed at [facility], [place] [address], [place], [place] [address]    Report Status PENDING  Incomplete         Radiology Studies: ECHOCARDIOGRAM COMPLETE  Result Date: 10/22/2020    ECHOCARDIOGRAM REPORT   Patient Name:   Colton Bush Date of Exam: 10/22/2020 Medical Rec #:   [id number]      Height:       70.0 in Accession #:    [id number]     Weight:       196.0 lb Date of Birth:  07/05/1956      BSA:          2.069 m Patient Age:    36 years       BP:           136/69 mmHg Patient Gender: M              HR:           79 bpm. Exam Location:  Inpatient Procedure: 2D Echo, Cardiac Doppler and Color Doppler Indications:    Bacteremia  History:        Patient has no prior history of Echocardiogram examinations.                 Signs/Symptoms:Bacteremia and UTI; Risk Factors:Hypertension,                 Diabetes and Dyslipidemia. Pancreatic cancer.  Sonographer:    Dustin Flock Referring Phys: [id number] Arsh Feutz CALDWELL POWELL JR  Sonographer Comments: Image acquisition challenging due to respiratory motion. IMPRESSIONS  1. Left ventricular ejection fraction, by estimation, is 55 to 60%. The left ventricle has normal function. The left ventricle  has no regional wall motion abnormalities. There is mild concentric left ventricular hypertrophy. Left ventricular diastolic parameters are consistent with Grade I diastolic dysfunction (impaired relaxation).  2. Right ventricular systolic function is normal. The right ventricular size is normal.  3. The mitral valve is normal in structure. No evidence of mitral valve regurgitation. No evidence of mitral stenosis.  4. The aortic valve is normal in structure. Aortic valve regurgitation is not visualized. No aortic stenosis is present.  5. The inferior vena cava is normal in size with greater than 50% respiratory variability, suggesting right atrial pressure of 3 mmHg. Conclusion(s)/Recommendation(s): No evidence of valvular vegetations on this transthoracic echocardiogram. Would recommend Dayla Gasca transesophageal echocardiogram to exclude infective endocarditis if clinically indicated. FINDINGS  Left Ventricle: Left ventricular ejection fraction, by estimation, is 55 to 60%. The left ventricle has normal function. The left ventricle has no regional wall motion  abnormalities. The left ventricular internal cavity size was normal in size. There is  mild concentric left ventricular hypertrophy. Left ventricular diastolic parameters are consistent with Grade I diastolic dysfunction (impaired relaxation). Indeterminate filling pressures. Right Ventricle: The right ventricular size is normal. No increase in right ventricular wall thickness. Right ventricular systolic function is normal. Left Atrium: Left atrial size was normal in size. Right Atrium: Right atrial size was normal in size. Pericardium: There is no evidence of pericardial effusion. Mitral Valve: The mitral valve is normal in structure. Mild mitral annular calcification. No evidence of mitral valve regurgitation. No evidence of mitral valve stenosis. Tricuspid Valve: The tricuspid valve is normal in structure. Tricuspid valve regurgitation is not demonstrated. No evidence of tricuspid stenosis. Aortic Valve: The aortic valve is normal in structure. Aortic valve regurgitation is not visualized. No aortic stenosis is present. Pulmonic Valve: The pulmonic valve was normal in structure. Pulmonic valve regurgitation is not visualized. No evidence of pulmonic stenosis. Aorta: The aortic root is normal in size and structure. Venous: The inferior vena cava is normal in size with greater than 50% respiratory variability, suggesting right atrial pressure of 3 mmHg. IAS/Shunts: No atrial level shunt detected by color flow Doppler.  LEFT VENTRICLE PLAX 2D LVIDd:         3.90 cm  Diastology LVIDs:         2.70 cm  LV e' medial:    7.72 cm/s LV PW:         1.30 cm  LV E/e' medial:  9.3 LV IVS:        1.30 cm  LV e' lateral:   6.64 cm/s LVOT diam:     2.50 cm  LV E/e' lateral: 10.8 LV SV:         102 LV SV Index:   49 LVOT Area:     4.91 cm  RIGHT VENTRICLE RV Basal diam:  2.90 cm RV S prime:     10.80 cm/s TAPSE (M-mode): 2.9 cm LEFT ATRIUM             Index       RIGHT ATRIUM           Index LA diam:        3.30 cm 1.59 cm/m   RA Area:     12.20 cm LA Vol (A2C):   54.9 ml 26.53 ml/m RA Volume:   29.40 ml  14.21 ml/m LA Vol (A4C):   29.8 ml 14.40 ml/m LA Biplane Vol: 41.4 ml 20.01 ml/m  AORTIC VALVE LVOT Vmax:   92.40 cm/s LVOT Vmean:  63.500 cm/s LVOT VTI:    0.207 m  AORTA Ao Root diam: 3.20 cm MITRAL VALVE MV Area (PHT): 7.16 cm     SHUNTS MV Decel Time: 106 msec     Systemic VTI:  0.21 m MV E velocity: 71.80 cm/s   Systemic Diam: 2.50 cm MV Lorae Roig velocity: 111.00 cm/s MV E/Tesa Meadors ratio:  0.65 Mihai Croitoru MD Electronically signed by Sanda Klein MD Signature Date/Time: 10/22/2020/4:07:33 PM    Final         Scheduled Meds: . amLODipine  10 mg Oral Daily  . Chlorhexidine Gluconate Cloth  6 each Topical Q0600  . heparin injection (subcutaneous)  5,000 Units Subcutaneous Q8H  . insulin aspart  0-15 Units Subcutaneous TID WC  . insulin aspart  0-5 Units Subcutaneous QHS  . insulin detemir  10 Units Subcutaneous QHS  . insulin detemir  15 Units Subcutaneous Daily  . lipase/protease/amylase  24,000 Units Oral TID with meals  . mupirocin ointment  1 application Nasal BID  . pantoprazole  40 mg Oral Daily  . potassium chloride SA  20 mEq Oral Daily  . rosuvastatin  20 mg Oral Daily   Continuous Infusions: . cefTRIAXone (ROCEPHIN)  IV 2 g (10/22/20 1025)  . methocarbamol (ROBAXIN) IV 500 mg (10/20/20 2319)     LOS: 4 days    Time spent: over 57 min    Fayrene Helper, MD [facility]   To contact the attending provider between 7A-7P or the covering provider during after hours 7P-7A, please log into the web site [email or web address] and access using universal [place] password for that web site. If you do not have the password, please call the hospital operator.  10/22/2020, 4:52 PM

## 2020-10-22 NOTE — Progress Notes (Signed)
[facility] for Infectious Disease    Date of Admission:  10/18/2020   Total days of antibiotics 3/ceftriaxone           ID: Colton Bush is a 65 y.o. male with  Streptococcal bacteremia Principal Problem:   Closed right hip fracture, initial encounter [facility]) Active Problems:   Essential hypertension   Type 2 diabetes mellitus with other specified complication ([place])   Adenocarcinoma of head of pancreas ([place])   Fall from ground level    Subjective: Afebrile, denies fever/chills/rigors. still has right hip pain  No diarrhea  Medications:  . amLODipine  10 mg Oral Daily  . Chlorhexidine Gluconate Cloth  6 each Topical Q0600  . heparin injection (subcutaneous)  5,000 Units Subcutaneous Q8H  . insulin aspart  0-15 Units Subcutaneous TID WC  . insulin aspart  0-5 Units Subcutaneous QHS  . insulin detemir  10 Units Subcutaneous QHS  . insulin detemir  15 Units Subcutaneous Daily  . lipase/protease/amylase  24,000 Units Oral TID with meals  . mupirocin ointment  1 application Nasal BID  . pantoprazole  40 mg Oral Daily  . potassium chloride SA  20 mEq Oral Daily  . rosuvastatin  20 mg Oral Daily    Objective: Vital signs in last 24 hours: Temp:  [97.6 F (36.4 C)-98.3 F (36.8 C)] 98.3 F (36.8 C) (03/24 2130) Pulse Rate:  [70-74] 70 (03/24 2130) Resp:  [15-18] 15 (03/24 2130) BP: (139-142)/(70-81) 142/70 (03/24 2130) SpO2:  [95 %] 95 % (03/24 2130) Weight:  [88.9 kg] 88.9 kg (03/24 1326) Physical Exam  Constitutional: He is oriented to person, place, and time. He appears well-developed and well-nourished. No distress.  HENT:  Mouth/Throat: Oropharynx is clear and moist. No oropharyngeal exudate.  Cardiovascular: Normal rate, regular rhythm and normal heart sounds. Exam reveals no gallop and no friction rub.  No murmur heard.  Pulmonary/Chest: Effort normal and breath sounds normal. No respiratory distress. He has no wheezes.  Abdominal: Soft. Bowel sounds are  normal. He exhibits no distension. There is no tenderness.  Lymphadenopathy:  He has no cervical adenopathy.  Neurological: He is alert and oriented to person, place, and time.  Skin: Skin is warm and dry. No rash noted. No erythema.  Psychiatric: He has a normal mood and affect. His behavior is normal.     Lab Results Recent Labs    10/21/20 0305 10/22/20 0307  WBC 16.1* 13.6*  HGB 10.0* 10.3*  HCT 30.1* 31.5*  NA 133* 133*  K 3.4* 3.6  CL 102 103  CO2 24 23  BUN 19 15  CREATININE 0.78 0.76   Liver Panel Recent Labs    10/21/20 0305 10/22/20 0307  PROT 6.5 7.0  ALBUMIN 2.1* 2.2*  AST 31 22  ALT 25 25  ALKPHOS 222* 210*  BILITOT 0.7 0.8   Sedimentation Rate No results for input(s): ESRSEDRATE in the last 72 hours. C-Reactive Protein No results for input(s): CRP in the last 72 hours.  Microbiology: Reviewed, repeat blood cx ngtd at < 24hr Studies/Results: No results found.   Assessment/Plan: Streptococcal bacteremia = continue on ceftriaxone  Uti, urinary urgency = will await to see what GNR is identified. For the moment, continue on ceftriaxone  Hip fracture = can have surgery on Sunday/monday. Would like to know that his repeat blood cx are NGTD x 48hr.  [facility] for Infectious Diseases Cell: [phone] Pager: [phone]  10/22/2020, 12:52 PM

## 2020-10-23 ENCOUNTER — Encounter (HOSPITAL_COMMUNITY)
Admission: EM | Disposition: A | Discharge status: Skilled Nursing Facility | Payer: Self-pay | Source: Home / Self Care | Attending: Family Medicine

## 2020-10-23 DIAGNOSIS — S72001A Fracture of unspecified part of neck of right femur, initial encounter for closed fracture: Secondary | ICD-10-CM | POA: Diagnosis not present

## 2020-10-23 LAB — COMPREHENSIVE METABOLIC PANEL
ALT: 21 U/L (ref 0–44)
AST: 16 U/L (ref 15–41)
Albumin: 2.2 g/dL — ABNORMAL LOW (ref 3.5–5.0)
Alkaline Phosphatase: 198 U/L — ABNORMAL HIGH (ref 38–126)
Anion gap: 8 (ref 5–15)
BUN: 18 mg/dL (ref 8–23)
CO2: 23 mmol/L (ref 22–32)
Calcium: 7.9 mg/dL — ABNORMAL LOW (ref 8.9–10.3)
Chloride: 103 mmol/L (ref 98–111)
Creatinine, Ser: 0.66 mg/dL (ref 0.61–1.24)
GFR, Estimated: >60 mL/min (ref >60)
Glucose, Bld: 195 mg/dL — ABNORMAL HIGH (ref 70–99)
Potassium: 3.6 mmol/L (ref 3.5–5.1)
Sodium: 134 mmol/L — ABNORMAL LOW (ref 135–145)
Total Bilirubin: 0.8 mg/dL (ref 0.3–1.2)
Total Protein: 7 g/dL (ref 6.5–8.1)

## 2020-10-23 LAB — CBC WITH DIFFERENTIAL/PLATELET
Abs Immature Granulocytes: 0.12 10*3/uL — ABNORMAL HIGH (ref 0.00–0.07)
Basophils Absolute: 0.1 10*3/uL (ref 0.0–0.1)
Basophils Relative: 1 %
Eosinophils Absolute: 0.2 10*3/uL (ref 0.0–0.5)
Eosinophils Relative: 1 %
HCT: 32 % — ABNORMAL LOW (ref 39.0–52.0)
Hemoglobin: 10.4 g/dL — ABNORMAL LOW (ref 13.0–17.0)
Immature Granulocytes: 1 %
Lymphocytes Relative: 13 %
Lymphs Abs: 1.6 10*3/uL (ref 0.7–4.0)
MCH: 29.8 pg (ref 26.0–34.0)
MCHC: 32.5 g/dL (ref 30.0–36.0)
MCV: 91.7 fL (ref 80.0–100.0)
Monocytes Absolute: 1 10*3/uL (ref 0.1–1.0)
Monocytes Relative: 8 %
Neutro Abs: 9.5 10*3/uL — ABNORMAL HIGH (ref 1.7–7.7)
Neutrophils Relative %: 76 %
Platelets: 511 10*3/uL — ABNORMAL HIGH (ref 150–400)
RBC: 3.49 MIL/uL — ABNORMAL LOW (ref 4.22–5.81)
RDW: 14.7 % (ref 11.5–15.5)
WBC: 12.5 10*3/uL — ABNORMAL HIGH (ref 4.0–10.5)
nRBC: 0 % (ref 0.0–0.2)

## 2020-10-23 LAB — URINE CULTURE: Culture: >=100000 — AB

## 2020-10-23 LAB — GLUCOSE, CAPILLARY
Glucose-Capillary: 192 mg/dL — ABNORMAL HIGH (ref 70–99)
Glucose-Capillary: 270 mg/dL — ABNORMAL HIGH (ref 70–99)
Glucose-Capillary: 264 mg/dL — ABNORMAL HIGH (ref 70–99)
Glucose-Capillary: 308 mg/dL — ABNORMAL HIGH (ref 70–99)

## 2020-10-23 LAB — MAGNESIUM: Magnesium: 2.2 mg/dL (ref 1.7–2.4)

## 2020-10-23 LAB — PHOSPHORUS: Phosphorus: 2.8 mg/dL (ref 2.5–4.6)

## 2020-10-23 SURGERY — INSERTION, INTRAMEDULLARY ROD, FEMUR
Anesthesia: General | Laterality: Right

## 2020-10-23 MED ORDER — INSULIN DETEMIR 100 UNIT/ML ~~LOC~~ SOLN
7.0000 [IU] | Freq: Every day | SUBCUTANEOUS | Status: DC
  Administered 2020-10-23: 7 [IU] via SUBCUTANEOUS
  Filled 2020-10-23 (×2): qty 0.07

## 2020-10-23 MED ORDER — TRAZODONE HCL 50 MG PO TABS
50.0000 mg | ORAL_TABLET | Freq: Every day | ORAL | Status: DC
  Administered 2020-10-23 – 2020-10-27 (×5): 50 mg via ORAL
  Filled 2020-10-23 (×5): qty 1

## 2020-10-23 MED ORDER — GLUCERNA PO LIQD
237.0000 mL | Freq: Two times a day (BID) | ORAL | Status: DC
  Administered 2020-10-23: 237 mL via ORAL

## 2020-10-23 MED ORDER — INSULIN DETEMIR 100 UNIT/ML ~~LOC~~ SOLN
7.0000 [IU] | Freq: Every day | SUBCUTANEOUS | Status: DC
  Administered 2020-10-24 – 2020-10-25 (×2): 7 [IU] via SUBCUTANEOUS
  Filled 2020-10-23 (×2): qty 0.07

## 2020-10-23 MED ORDER — GLUCERNA SHAKE PO LIQD
237.0000 mL | Freq: Three times a day (TID) | ORAL | Status: DC
  Administered 2020-10-23 – 2020-10-28 (×11): 237 mL via ORAL
  Filled 2020-10-23 (×16): qty 237

## 2020-10-23 NOTE — Progress Notes (Signed)
Eras protocol ordered for patient tonight, patient is diabetic with a blood sugar this evening of 308, G2 low sugar drink unavailable from pharmacy and also unavailable from dietary, only high sugar gatorades were available. Will continue with clears until 4am although patient states he will just remain npo after midnight. Will continue to monitor.

## 2020-10-23 NOTE — Plan of Care (Signed)
  Problem: Health Behavior/Discharge Planning: Goal: Ability to manage health-related needs will improve Outcome: Progressing   Problem: Clinical Measurements: Goal: Ability to maintain clinical measurements within normal limits will improve Outcome: Progressing Goal: Will remain free from infection Outcome: Progressing Goal: Diagnostic test results will improve Outcome: Progressing Goal: Respiratory complications will improve Outcome: Progressing Goal: Cardiovascular complication will be avoided Outcome: Progressing   Problem: Activity: Goal: Risk for activity intolerance will decrease Outcome: Progressing   Problem: Nutrition: Goal: Adequate nutrition will be maintained Outcome: Progressing   Problem: Coping: Goal: Level of anxiety will decrease Outcome: Progressing   Problem: Elimination: Goal: Will not experience complications related to bowel motility Outcome: Progressing Goal: Will not experience complications related to urinary retention Outcome: Progressing   Problem: Pain Managment: Goal: General experience of comfort will improve Outcome: Progressing   Problem: Safety: Goal: Ability to remain free from injury will improve Outcome: Progressing   Problem: Skin Integrity: Goal: Risk for impaired skin integrity will decrease Outcome: Progressing   Problem: Education: Goal: Verbalization of understanding the information provided (i.e., activity precautions, restrictions, etc) will improve Outcome: Progressing   Problem: Activity: Goal: Ability to ambulate and perform ADLs will improve Outcome: Progressing   Problem: Clinical Measurements: Goal: Postoperative complications will be avoided or minimized Outcome: Progressing   Problem: Self-Concept: Goal: Ability to maintain and perform role responsibilities to the fullest extent possible will improve Outcome: Progressing   Problem: Pain Management: Goal: Pain level will decrease Outcome: Progressing   

## 2020-10-23 NOTE — Progress Notes (Addendum)
PROGRESS NOTE    Colton Bush  WUX:[id number] DOB: Sep 04, 1955 DOA: 10/18/2020 PCP: [name], MD   Chief Complaint  Patient presents with  . Fall   Brief Narrative:  Colton Bush is Colton Bush 65 y.o. male with Fremon Zacharia history of recurrent pancreatic cancer, diabetes mellitus, hypertension, hyperlipidemia. Patient presented secondary to Colton Bush fall and subsequent right hip fracture. Plan for surgery.  Assessment & Plan:   Principal Problem:   Closed right hip fracture, initial encounter [facility]) Active Problems:   Essential hypertension   Type 2 diabetes mellitus with other specified complication ([place])   Adenocarcinoma of head of pancreas ([place])   Fall from ground level  Strep Anginosis Bacteremia  E. Coli Urinary Tract Infection   SIRS Fever 3/22 PM as well as worsened leukocytosis 3/23 -> seems out of proportion to what would be expected with hip fx due to reactive leukocytosis or simple UTI.   Blood cx with 1/4 with strep anginosis - unclear significance Repeat blood cx 3/25 AM NGTD - appears he's been scheduled for OR at 0954 3/27 am - if blood cultures from 3/25 remain NGTD at that time, ok to proceed with surgery at that time E. Coli UTI, sensitivities pending  Continue ceftriaxone  Will consult ID, appreciate assistance -> recommending TTE (EF 25-36%, grade 1 diastolic dysfunction - without evidence of valvular vegetations - see report - will defer question of need for TEE to ID).  Need repeat cx NGTD x 48 hrs prior to surgery.  Discussed with ID today, noted ok with surgery 3/27 if cx remain ngtd - no need for additional echo.  Hold off on surgery for now.    Right hip fracture Right comminuted/angulated intertrochanteric fracture. Orthopedic surgery consulted.  Surgery pending workup for bacteremia above.  -Orthopedic surgery recommendations: Surgery -Continue oxycodone and morphine prn  Fall Resulting in above. No syncope.  Diabetes mellitus, type 2 Patient is on Levemir and  Novolog sliding scale as an outpatient. Conflicting diagnoses on chart review between type 1 and type 2. Started on Levemir 15 units BID. - continue levemir BID here, adjust as needed in preparation for surgery -Continue Levemir and SSI  Pancreatic cancer Recurrence. Patient follows with Dr. [name] as an outpatient. Plan for radiation therapy as an outpatient. Patient plans to follow up with Dr. [name], radiation oncology. -Continue Creon -added Dr. [name] to treatment team - see 3/24 note  Primary hypertension -Continue amlodipine  Hyperlipidemia -Continue Crestor  GERD -Continue Protonix  DVT prophylaxis: heparin - hold after 3/26 PM dose, reevaluate Code Status: full  Family Communication: wife at bedside Disposition:   Status is: Inpatient  Remains inpatient appropriate because:Inpatient level of care appropriate due to severity of illness   Dispo: The patient is from: Home              Anticipated d/c is to: pending              Patient currently is not medically stable to d/c.   Difficult to place patient No   Consultants:   ortho  Procedures: Echo 1. Left ventricular ejection fraction, by estimation, is 55 to 60%. The  left ventricle has normal function. The left ventricle has no regional  wall motion abnormalities. There is mild concentric left ventricular  hypertrophy. Left ventricular diastolic  parameters are consistent with Grade I diastolic dysfunction (impaired  relaxation).  2. Right ventricular systolic function is normal. The right ventricular  size is normal.  3. The mitral  valve is normal in structure. No evidence of mitral valve  regurgitation. No evidence of mitral stenosis.  4. The aortic valve is normal in structure. Aortic valve regurgitation is  not visualized. No aortic stenosis is present.  5. The inferior vena cava is normal in size with greater than 50%  respiratory variability, suggesting right atrial pressure of 3 mmHg.    Antimicrobials:  Anti-infectives (From admission, onward)   Start     Dose/Rate Route Frequency Ordered Stop   10/22/20 1000  cefTRIAXone (ROCEPHIN) 2 g in sodium chloride 0.9 % 100 mL IVPB        2 g 200 mL/hr over 30 Minutes Intravenous Every 24 hours 10/21/20 1343     10/21/20 1400  cefTRIAXone (ROCEPHIN) 1 g in sodium chloride 0.9 % 100 mL IVPB        1 g 200 mL/hr over 30 Minutes Intravenous  Once 10/21/20 1343 10/21/20 1624   10/21/20 1330  ceFAZolin (ANCEF) IVPB 2g/100 mL premix  Status:  Discontinued        2 g 200 mL/hr over 30 Minutes Intravenous On call to O.R. 10/21/20 1323 10/21/20 1350   10/21/20 1330  ceFAZolin (ANCEF) IVPB 2g/100 mL premix  Status:  Discontinued        2 g 200 mL/hr over 30 Minutes Intravenous  Once 10/21/20 1324 10/21/20 1325   10/21/20 1300  ceFAZolin (ANCEF) 2-4 GM/100ML-% IVPB       Note to Pharmacy: [name]   : cabinet override      10/21/20 1300 10/22/20 0114   10/20/20 1230  cefTRIAXone (ROCEPHIN) 1 g in sodium chloride 0.9 % 100 mL IVPB  Status:  Discontinued        1 g 200 mL/hr over 30 Minutes Intravenous Every 24 hours 10/20/20 1141 10/21/20 1354   10/20/20 0600  ceFAZolin (ANCEF) IVPB 2g/100 mL premix        2 g 200 mL/hr over 30 Minutes Intravenous On call to O.R. 10/19/20 2233 10/21/20 0559     Subjective: Sleepy today when I see him frustrated  Objective: Vitals:   10/22/20 1301 10/22/20 2153 10/23/20 0623 10/23/20 1309  BP: 136/69 138/67 127/65 (!) 110/57  Pulse: 79 90 77 69  Resp: 16 20 18 17   Temp: 98.3 F (36.8 C) 98.9 F (37.2 C) 98.3 F (36.8 C) 97.8 F (36.6 C)  TempSrc: Oral Oral Oral Oral  SpO2: 95% 94% 95% 96%  Weight:      Height:        Intake/Output Summary (Last 24 hours) at 10/23/2020 1458 Last data filed at 10/23/2020 1411 Gross per 24 hour  Intake 1560 ml  Output 2275 ml  Net -715 ml   Filed Weights   10/18/20 2330 10/21/20 1326  Weight: 88.9 kg 88.9 kg    Examination:  General: No  acute distress. Cardiovascular: Heart sounds show Colton Bush regular rate, and rhythm. Lungs: Clear to auscultation bilaterally. Abdomen: Soft, nontender, nondistended Neurological: Alert and oriented 3. Moves all extremities 4. Cranial nerves II through XII grossly intact. Skin: Warm and dry. No rashes or lesions. Extremities: RLE shorted and externally rotated   Data Reviewed: I have personally reviewed following labs and imaging studies  CBC: Recent Labs  Lab 10/18/20 1911 10/19/20 0259 10/20/20 0318 10/21/20 0305 10/22/20 0307 10/23/20 0321  WBC 23.6* 14.7* 32.4* 16.1* 13.6* 12.5*  NEUTROABS 21.4*  --   --  13.4* 10.9* 9.5*  HGB 11.6* 10.7* 10.4* 10.0* 10.3* 10.4*  HCT  34.4* 31.3* 30.7* 30.1* 31.5* 32.0*  MCV 90.1 88.4 89.2 90.1 90.0 91.7  PLT 366 373 385 406* 446* 511*    Basic Metabolic Panel: Recent Labs  Lab 10/19/20 0259 10/20/20 0318 10/21/20 0305 10/22/20 0307 10/23/20 0321  NA 135 138 133* 133* 134*  K 3.4* 3.5 3.4* 3.6 3.6  CL 102 105 102 103 103  CO2 27 26 24 23 23   GLUCOSE 216* 202* 217* 189* 195*  BUN 14 20 19 15 18   CREATININE 0.53* 0.91 0.78 0.76 0.66  CALCIUM 7.9* 7.9* 7.5* 7.7* 7.9*  MG 2.1  --  2.2 2.0 2.2  PHOS 2.8  --  2.6 2.7 2.8    GFR: Estimated Creatinine Clearance: 100.6 mL/min (by C-G formula based on SCr of 0.66 mg/dL).  Liver Function Tests: Recent Labs  Lab 10/19/20 0259 10/20/20 0318 10/21/20 0305 10/22/20 0307 10/23/20 0321  AST 22  21 19 31 22 16   ALT 30  31 23 25 25 21   ALKPHOS 270*  276* 254* 222* 210* 198*  BILITOT 0.9  1.0 1.0 0.7 0.8 0.8  PROT 6.8  6.8 6.6 6.5 7.0 7.0  ALBUMIN 2.3*  2.4* 2.1* 2.1* 2.2* 2.2*    CBG: Recent Labs  Lab 10/22/20 1118 10/22/20 1625 10/22/20 2054 10/23/20 0732 10/23/20 1107  GLUCAP 254* 252* 190* 192* 270*     Recent Results (from the past 240 hour(s))  Resp Panel by RT-PCR (Flu Ardie Mclennan&B, Covid) Nasopharyngeal Swab     Status: None   Collection Time: 10/18/20  7:11 PM    Specimen: Nasopharyngeal Swab; Nasopharyngeal(NP) swabs in vial transport medium  Result Value Ref Range Status   SARS Coronavirus 2 by RT PCR NEGATIVE NEGATIVE Final    Comment: (NOTE) SARS-CoV-2 target nucleic acids are NOT DETECTED.  The SARS-CoV-2 RNA is generally detectable in upper respiratory specimens during the acute phase of infection. The lowest concentration of SARS-CoV-2 viral copies this assay can detect is 138 copies/mL. Isadora Delorey negative result does not preclude SARS-Cov-2 infection and should not be used as the sole basis for treatment or other patient management decisions. Latiffany Harwick negative result may occur with  improper specimen collection/handling, submission of specimen other than nasopharyngeal swab, presence of viral mutation(s) within the areas targeted by this assay, and inadequate number of viral copies(<138 copies/mL). Kirsten Mckone negative result must be combined with clinical observations, patient history, and epidemiological information. The expected result is Negative.  Fact Sheet for Patients:  EntrepreneurPulse.com.au  Fact Sheet for Healthcare Providers:  IncredibleEmployment.be  This test is no t yet approved or cleared by the [place] FDA and  has been authorized for detection and/or diagnosis of SARS-CoV-2 by FDA under an Emergency Use Authorization (EUA). This EUA will remain  in effect (meaning this test can be used) for the duration of the COVID-19 declaration under Section 564(b)(1) of the Act, 21 U.S.C.section 360bbb-3(b)(1), unless the authorization is terminated  or revoked sooner.       Influenza Jamaar Howes by PCR NEGATIVE NEGATIVE Final   Influenza B by PCR NEGATIVE NEGATIVE Final    Comment: (NOTE) The Xpert Xpress SARS-CoV-2/FLU/RSV plus assay is intended as an aid in the diagnosis of influenza from Nasopharyngeal swab specimens and should not be used as Vasco Chong sole basis for treatment. Nasal washings and aspirates are  unacceptable for Xpert Xpress SARS-CoV-2/FLU/RSV testing.  Fact Sheet for Patients: EntrepreneurPulse.com.au  Fact Sheet for Healthcare Providers: IncredibleEmployment.be  This test is not yet approved or cleared by the [place] FDA  and has been authorized for detection and/or diagnosis of SARS-CoV-2 by FDA under an Emergency Use Authorization (EUA). This EUA will remain in effect (meaning this test can be used) for the duration of the COVID-19 declaration under Section 564(b)(1) of the Act, 21 U.S.C. section 360bbb-3(b)(1), unless the authorization is terminated or revoked.  Performed at [facility], [place] [address], [place], [place] [address]   Surgical pcr screen     Status: Abnormal   Collection Time: 10/19/20 12:47 AM   Specimen: Nasal Mucosa; Nasal Swab  Result Value Ref Range Status   MRSA, PCR NEGATIVE NEGATIVE Final   Staphylococcus aureus POSITIVE (Jaiana Sheffer) NEGATIVE Final    Comment: RESULT CALLED TO, READ BACK BY AND VERIFIED WITH: BOBBY, RN @ [phone] ON 10/19/20 C VARNER (NOTE) The Xpert SA Assay (FDA approved for NASAL specimens in patients 51 years of age and older), is one component of Jamell Laymon comprehensive surveillance program. It is not intended to diagnose infection nor to guide or monitor treatment. Performed at [facility], [place] [address], [place], [place] [address]   Culture, blood (routine x 2)     Status: None (Preliminary result)   Collection Time: 10/20/20  7:55 AM   Specimen: BLOOD  Result Value Ref Range Status   Specimen Description   Final    BLOOD RIGHT ANTECUBITAL Performed at [place] [address], [place], [place] [address]    Special Requests   Final    BOTTLES DRAWN AEROBIC AND ANAEROBIC Blood Culture results may not be optimal due to an inadequate volume of blood received in culture bottles Performed at [place]  [address], [place], [place] [address]    Culture   Final    NO GROWTH 3 DAYS Performed at [facility], [place] [address] [place], [place] [address]    Report Status PENDING  Incomplete  Culture, blood (routine x 2)     Status: Abnormal (Preliminary result)   Collection Time: 10/20/20  7:56 AM   Specimen: BLOOD  Result Value Ref Range Status   Specimen Description   Final    BLOOD RIGHT ANTECUBITAL Performed at [place] [address], [place], [place] [address]    Special Requests   Final    BOTTLES DRAWN AEROBIC AND ANAEROBIC Blood Culture results may not be optimal due to an inadequate volume of blood received in culture bottles Performed at [place] [address], [place],  [address]    Culture  Setup Time   Final    AEROBIC BOTTLE ONLY GRAM POSITIVE COCCI CRITICAL RESULT CALLED TO, READ BACK BY AND VERIFIED WITH: Melodye Ped PHARMD 1316 10/21/20 Man Bonneau BROWNING    Culture (Audrey Eller)  Final    STREPTOCOCCUS ANGINOSIS THE SIGNIFICANCE OF ISOLATING THIS ORGANISM FROM Peyten Weare SINGLE SET OF BLOOD CULTURES WHEN MULTIPLE SETS ARE DRAWN IS UNCERTAIN. PLEASE NOTIFY THE MICROBIOLOGY DEPARTMENT WITHIN ONE WEEK IF SPECIATION AND SENSITIVITIES ARE REQUIRED. Performed at [facility], [place] [address] [place],  [address]    Report Status PENDING  Incomplete  Blood Culture ID Panel (Reflexed)     Status: Abnormal   Collection Time: 10/20/20  7:56 AM  Result Value Ref Range Status   Enterococcus faecalis NOT DETECTED NOT DETECTED Final   Enterococcus Faecium NOT DETECTED NOT DETECTED Final   Listeria monocytogenes NOT DETECTED NOT DETECTED Final   Staphylococcus species NOT DETECTED NOT DETECTED Final  Staphylococcus aureus (BCID) NOT DETECTED NOT DETECTED Final   Staphylococcus epidermidis NOT DETECTED NOT DETECTED Final   Staphylococcus lugdunensis NOT DETECTED NOT DETECTED Final   Streptococcus species DETECTED (Larene Ascencio) NOT DETECTED Final     Comment: Not Enterococcus species, Streptococcus agalactiae, Streptococcus pyogenes, or Streptococcus pneumoniae. CRITICAL RESULT CALLED TO, READ BACK BY AND VERIFIED WITH: Melodye Ped PHARMD 1316 10/21/20 Jecenia Leamer BROWNING    Streptococcus agalactiae NOT DETECTED NOT DETECTED Final   Streptococcus pneumoniae NOT DETECTED NOT DETECTED Final   Streptococcus pyogenes NOT DETECTED NOT DETECTED Final   Amilliana Hayworth.calcoaceticus-baumannii NOT DETECTED NOT DETECTED Final   Bacteroides fragilis NOT DETECTED NOT DETECTED Final   Enterobacterales NOT DETECTED NOT DETECTED Final   Enterobacter cloacae complex NOT DETECTED NOT DETECTED Final   Escherichia coli NOT DETECTED NOT DETECTED Final   Klebsiella aerogenes NOT DETECTED NOT DETECTED Final   Klebsiella oxytoca NOT DETECTED NOT DETECTED Final   Klebsiella pneumoniae NOT DETECTED NOT DETECTED Final   Proteus species NOT DETECTED NOT DETECTED Final   Salmonella species NOT DETECTED NOT DETECTED Final   Serratia marcescens NOT DETECTED NOT DETECTED Final   Haemophilus influenzae NOT DETECTED NOT DETECTED Final   Neisseria meningitidis NOT DETECTED NOT DETECTED Final   Pseudomonas aeruginosa NOT DETECTED NOT DETECTED Final   Stenotrophomonas maltophilia NOT DETECTED NOT DETECTED Final   Candida albicans NOT DETECTED NOT DETECTED Final   Candida auris NOT DETECTED NOT DETECTED Final   Candida glabrata NOT DETECTED NOT DETECTED Final   Candida krusei NOT DETECTED NOT DETECTED Final   Candida parapsilosis NOT DETECTED NOT DETECTED Final   Candida tropicalis NOT DETECTED NOT DETECTED Final   Cryptococcus neoformans/gattii NOT DETECTED NOT DETECTED Final    Comment: Performed at [facility], [address], [place], [place] [address]  Culture, Urine     Status: Abnormal   Collection Time: 10/20/20 10:25 AM   Specimen: Urine, Clean Catch  Result Value Ref Range Status   Specimen Description   Final    URINE, CLEAN CATCH Performed at [facility], [place] [address] [place], [place] [address]    Special Requests   Final    NONE Performed at [facility], [place] [address], [place], [place] [address]    Culture >=100,000 COLONIES/mL ESCHERICHIA COLI (Zariel Capano)  Final   Report Status 10/23/2020 FINAL  Final   Organism ID, Bacteria ESCHERICHIA COLI (Elisabella Hacker)  Final      Susceptibility   Escherichia coli - MIC*    AMPICILLIN 4 SENSITIVE Sensitive     CEFAZOLIN <=4 SENSITIVE Sensitive     CEFEPIME <=0.12 SENSITIVE Sensitive     CEFTRIAXONE <=0.25 SENSITIVE Sensitive     CIPROFLOXACIN <=0.25 SENSITIVE Sensitive     GENTAMICIN <=1 SENSITIVE Sensitive     IMIPENEM <=0.25 SENSITIVE Sensitive     NITROFURANTOIN <=16 SENSITIVE Sensitive     TRIMETH/SULFA <=20 SENSITIVE Sensitive     AMPICILLIN/SULBACTAM <=2 SENSITIVE Sensitive     PIP/TAZO <=4 SENSITIVE Sensitive     * >=100,000 COLONIES/mL ESCHERICHIA COLI  Culture, blood (routine x 2)     Status: None (Preliminary result)   Collection Time: 10/22/20  7:26 AM   Specimen: BLOOD  Result Value Ref Range Status   Specimen Description   Final    BLOOD LEFT ANTECUBITAL Performed at [facility] [address] [place], [place] [address]    Special Requests   Final    BOTTLES DRAWN AEROBIC ONLY Blood  Culture results may not be optimal due to an inadequate volume of blood received in culture bottles Performed at [place] [address], [place], [place] [address]    Culture   Final    NO GROWTH 1 DAY Performed at [facility], [place] [address] [place], [place] [address]    Report Status PENDING  Incomplete  Culture, blood (routine x 2)     Status: None (Preliminary result)   Collection Time: 10/22/20  7:26 AM   Specimen: BLOOD  Result Value Ref Range Status   Specimen Description   Final    BLOOD LEFT ANTECUBITAL Performed at [place] [address], [place], [place] [address]    Special Requests    Final    BOTTLES DRAWN AEROBIC ONLY Blood Culture results may not be optimal due to an inadequate volume of blood received in culture bottles Performed at [place] [address], [place], [place] [address]    Culture   Final    NO GROWTH 1 DAY Performed at [facility], [place] [address] [place], [place] [address]    Report Status PENDING  Incomplete         Radiology Studies: ECHOCARDIOGRAM COMPLETE  Result Date: 10/22/2020    ECHOCARDIOGRAM REPORT   Patient Name:   Colton Bush Date of Exam: 10/22/2020 Medical Rec #:  [id number]      Height:       70.0 in Accession #:    [id number]     Weight:       196.0 lb Date of Birth:  08-Feb-1956      BSA:          2.069 m Patient Age:    54 years       BP:           136/69 mmHg Patient Gender: M              HR:           79 bpm. Exam Location:  Inpatient Procedure: 2D Echo, Cardiac Doppler and Color Doppler Indications:    Bacteremia  History:        Patient has no prior history of Echocardiogram examinations.                 Signs/Symptoms:Bacteremia and UTI; Risk Factors:Hypertension,                 Diabetes and Dyslipidemia. Pancreatic cancer.  Sonographer:    Dustin Flock Referring Phys: [phone] Zakkiyya Barno CALDWELL POWELL JR  Sonographer Comments: Image acquisition challenging due to respiratory motion. IMPRESSIONS  1. Left ventricular ejection fraction, by estimation, is 55 to 60%. The left ventricle has normal function. The left ventricle has no regional wall motion abnormalities. There is mild concentric left ventricular hypertrophy. Left ventricular diastolic parameters are consistent with Grade I diastolic dysfunction (impaired relaxation).  2. Right ventricular systolic function is normal. The right ventricular size is normal.  3. The mitral valve is normal in structure. No evidence of mitral valve regurgitation. No evidence of mitral stenosis.  4. The aortic valve is normal in structure. Aortic valve regurgitation is  not visualized. No aortic stenosis is present.  5. The inferior vena cava is normal in size with greater than 50% respiratory variability, suggesting right atrial pressure of 3 mmHg. Conclusion(s)/Recommendation(s): No evidence of valvular vegetations on this transthoracic echocardiogram. Would recommend Fernie Grimm transesophageal echocardiogram to exclude infective endocarditis if  clinically indicated. FINDINGS  Left Ventricle: Left ventricular ejection fraction, by estimation, is 55 to 60%. The left ventricle has normal function. The left ventricle has no regional wall motion abnormalities. The left ventricular internal cavity size was normal in size. There is  mild concentric left ventricular hypertrophy. Left ventricular diastolic parameters are consistent with Grade I diastolic dysfunction (impaired relaxation). Indeterminate filling pressures. Right Ventricle: The right ventricular size is normal. No increase in right ventricular wall thickness. Right ventricular systolic function is normal. Left Atrium: Left atrial size was normal in size. Right Atrium: Right atrial size was normal in size. Pericardium: There is no evidence of pericardial effusion. Mitral Valve: The mitral valve is normal in structure. Mild mitral annular calcification. No evidence of mitral valve regurgitation. No evidence of mitral valve stenosis. Tricuspid Valve: The tricuspid valve is normal in structure. Tricuspid valve regurgitation is not demonstrated. No evidence of tricuspid stenosis. Aortic Valve: The aortic valve is normal in structure. Aortic valve regurgitation is not visualized. No aortic stenosis is present. Pulmonic Valve: The pulmonic valve was normal in structure. Pulmonic valve regurgitation is not visualized. No evidence of pulmonic stenosis. Aorta: The aortic root is normal in size and structure. Venous: The inferior vena cava is normal in size with greater than 50% respiratory variability, suggesting right atrial pressure of 3  mmHg. IAS/Shunts: No atrial level shunt detected by color flow Doppler.  LEFT VENTRICLE PLAX 2D LVIDd:         3.90 cm  Diastology LVIDs:         2.70 cm  LV e' medial:    7.72 cm/s LV PW:         1.30 cm  LV E/e' medial:  9.3 LV IVS:        1.30 cm  LV e' lateral:   6.64 cm/s LVOT diam:     2.50 cm  LV E/e' lateral: 10.8 LV SV:         102 LV SV Index:   49 LVOT Area:     4.91 cm  RIGHT VENTRICLE RV Basal diam:  2.90 cm RV S prime:     10.80 cm/s TAPSE (M-mode): 2.9 cm LEFT ATRIUM             Index       RIGHT ATRIUM           Index LA diam:        3.30 cm 1.59 cm/m  RA Area:     12.20 cm LA Vol (A2C):   54.9 ml 26.53 ml/m RA Volume:   29.40 ml  14.21 ml/m LA Vol (A4C):   29.8 ml 14.40 ml/m LA Biplane Vol: 41.4 ml 20.01 ml/m  AORTIC VALVE LVOT Vmax:   92.40 cm/s LVOT Vmean:  63.500 cm/s LVOT VTI:    0.207 m  AORTA Ao Root diam: 3.20 cm MITRAL VALVE MV Area (PHT): 7.16 cm     SHUNTS MV Decel Time: 106 msec     Systemic VTI:  0.21 m MV E velocity: 71.80 cm/s   Systemic Diam: 2.50 cm MV Sena Hoopingarner velocity: 111.00 cm/s MV E/Iver Miklas ratio:  0.65 Mihai Croitoru MD Electronically signed by Sanda Klein MD Signature Date/Time: 10/22/2020/4:07:33 PM    Final         Scheduled Meds: . amLODipine  10 mg Oral Daily  . Chlorhexidine Gluconate Cloth  6 each Topical Q0600  . feeding supplement (GLUCERNA SHAKE)  237 mL Oral TID BM  . heparin injection (  subcutaneous)  5,000 Units Subcutaneous Q8H  . insulin aspart  0-15 Units Subcutaneous TID WC  . insulin aspart  0-5 Units Subcutaneous QHS  . insulin detemir  10 Units Subcutaneous QHS  . insulin detemir  15 Units Subcutaneous Daily  . lipase/protease/amylase  24,000 Units Oral TID with meals  . mupirocin ointment  1 application Nasal BID  . pantoprazole  40 mg Oral Daily  . potassium chloride SA  20 mEq Oral Daily  . rosuvastatin  20 mg Oral Daily   Continuous Infusions: . cefTRIAXone (ROCEPHIN)  IV 2 g (10/23/20 0908)  . methocarbamol (ROBAXIN) IV 500 mg  (10/20/20 2319)     LOS: 5 days    Time spent: over 79 min    Fayrene Helper, MD [facility]   To contact the attending provider between 7A-7P or the covering provider during after hours 7P-7A, please log into the web site [email or web address] and access using universal [place] password for that web site. If you do not have the password, please call the hospital operator.  10/23/2020, 2:58 PM

## 2020-10-23 NOTE — Progress Notes (Signed)
OT Cancellation Note  Patient Details Name: MICHAL CALLICOTT MRN: [id number] DOB: 11-30-1955   Cancelled Treatment:    Reason Eval/Treat Not Completed: Other (comment)  Pt surgery for hip fx continues to be delayed by ongoing medical condition.  Will dc from OT service at this time.  Please reorder post-op.  Quinnton Bury L Wahid Holley 10/23/2020, 2:05 PM

## 2020-10-23 NOTE — Plan of Care (Signed)
  Problem: Clinical Measurements: Goal: Respiratory complications will improve Outcome: Progressing   Problem: Clinical Measurements: Goal: Cardiovascular complication will be avoided Outcome: Progressing   Problem: Activity: Goal: Risk for activity intolerance will decrease Outcome: Progressing   Problem: Elimination: Goal: Will not experience complications related to bowel motility Outcome: Progressing   Problem: Activity: Goal: Ability to ambulate and perform ADLs will improve Outcome: Progressing   Problem: Pain Management: Goal: Pain level will decrease Outcome: Progressing

## 2020-10-23 NOTE — Progress Notes (Addendum)
Surgery today cancelled per ID. Plan for surgery tomorrow if ready. NPO after MN.

## 2020-10-23 NOTE — Progress Notes (Signed)
PT Cancellation Note  Patient Details Name: Colton Bush MRN: [id number] DOB: Sep 16, 1955   Cancelled Treatment:     Pt surgery for hip fx continues to be delayed by ongoing medical condition.  Will dc from PT service at this time.  Please reorder post-op.   Dannia Snook 10/23/2020, 8:35 AM

## 2020-10-24 ENCOUNTER — Encounter (HOSPITAL_COMMUNITY)
Admission: EM | Disposition: A | Discharge status: Skilled Nursing Facility | Payer: Self-pay | Source: Home / Self Care | Attending: Family Medicine

## 2020-10-24 DIAGNOSIS — E119 Type 2 diabetes mellitus without complications: Secondary | ICD-10-CM | POA: Diagnosis not present

## 2020-10-24 DIAGNOSIS — Z794 Long term (current) use of insulin: Secondary | ICD-10-CM | POA: Diagnosis not present

## 2020-10-24 DIAGNOSIS — N390 Urinary tract infection, site not specified: Secondary | ICD-10-CM | POA: Diagnosis not present

## 2020-10-24 DIAGNOSIS — S72001A Fracture of unspecified part of neck of right femur, initial encounter for closed fracture: Secondary | ICD-10-CM | POA: Diagnosis not present

## 2020-10-24 LAB — CBC WITH DIFFERENTIAL/PLATELET
Abs Immature Granulocytes: 0.25 10*3/uL — ABNORMAL HIGH (ref 0.00–0.07)
Basophils Absolute: 0.1 10*3/uL (ref 0.0–0.1)
Basophils Relative: 1 %
Eosinophils Absolute: 0.2 10*3/uL (ref 0.0–0.5)
Eosinophils Relative: 2 %
HCT: 32.4 % — ABNORMAL LOW (ref 39.0–52.0)
Hemoglobin: 10.7 g/dL — ABNORMAL LOW (ref 13.0–17.0)
Immature Granulocytes: 2 %
Lymphocytes Relative: 9 %
Lymphs Abs: 1.1 10*3/uL (ref 0.7–4.0)
MCH: 29.9 pg (ref 26.0–34.0)
MCHC: 33 g/dL (ref 30.0–36.0)
MCV: 90.5 fL (ref 80.0–100.0)
Monocytes Absolute: 0.9 10*3/uL (ref 0.1–1.0)
Monocytes Relative: 7 %
Neutro Abs: 10.2 10*3/uL — ABNORMAL HIGH (ref 1.7–7.7)
Neutrophils Relative %: 79 %
Platelets: 554 10*3/uL — ABNORMAL HIGH (ref 150–400)
RBC: 3.58 MIL/uL — ABNORMAL LOW (ref 4.22–5.81)
RDW: 14.7 % (ref 11.5–15.5)
WBC: 12.7 10*3/uL — ABNORMAL HIGH (ref 4.0–10.5)
nRBC: 0 % (ref 0.0–0.2)

## 2020-10-24 LAB — COMPREHENSIVE METABOLIC PANEL
ALT: 21 U/L (ref 0–44)
AST: 18 U/L (ref 15–41)
Albumin: 2.1 g/dL — ABNORMAL LOW (ref 3.5–5.0)
Alkaline Phosphatase: 255 U/L — ABNORMAL HIGH (ref 38–126)
Anion gap: 7 (ref 5–15)
BUN: 22 mg/dL (ref 8–23)
CO2: 24 mmol/L (ref 22–32)
Calcium: 7.9 mg/dL — ABNORMAL LOW (ref 8.9–10.3)
Chloride: 103 mmol/L (ref 98–111)
Creatinine, Ser: 0.72 mg/dL (ref 0.61–1.24)
GFR, Estimated: >60 mL/min (ref >60)
Glucose, Bld: 336 mg/dL — ABNORMAL HIGH (ref 70–99)
Potassium: 4.3 mmol/L (ref 3.5–5.1)
Sodium: 134 mmol/L — ABNORMAL LOW (ref 135–145)
Total Bilirubin: 0.6 mg/dL (ref 0.3–1.2)
Total Protein: 7 g/dL (ref 6.5–8.1)

## 2020-10-24 LAB — PROTIME-INR
INR: 1.2 (ref 0.8–1.2)
Prothrombin Time: 14.4 seconds (ref 11.4–15.2)

## 2020-10-24 LAB — PHOSPHORUS: Phosphorus: 2.8 mg/dL (ref 2.5–4.6)

## 2020-10-24 LAB — CULTURE, BLOOD (ROUTINE X 2)

## 2020-10-24 LAB — GLUCOSE, CAPILLARY
Glucose-Capillary: 308 mg/dL — ABNORMAL HIGH (ref 70–99)
Glucose-Capillary: 206 mg/dL — ABNORMAL HIGH (ref 70–99)
Glucose-Capillary: 388 mg/dL — ABNORMAL HIGH (ref 70–99)
Glucose-Capillary: 284 mg/dL — ABNORMAL HIGH (ref 70–99)

## 2020-10-24 LAB — MAGNESIUM: Magnesium: 2.1 mg/dL (ref 1.7–2.4)

## 2020-10-24 SURGERY — INSERTION, INTRAMEDULLARY ROD, FEMUR
Anesthesia: General | Laterality: Right

## 2020-10-24 MED ORDER — TRANEXAMIC ACID-NACL 1000-0.7 MG/100ML-% IV SOLN
INTRAVENOUS | Status: AC
  Filled 2020-10-24: qty 100

## 2020-10-24 MED ORDER — INSULIN ASPART 100 UNIT/ML ~~LOC~~ SOLN
3.0000 [IU] | Freq: Three times a day (TID) | SUBCUTANEOUS | Status: DC
  Administered 2020-10-24: 3 [IU] via SUBCUTANEOUS

## 2020-10-24 MED ORDER — POLYETHYLENE GLYCOL 3350 17 G PO PACK
17.0000 g | PACK | Freq: Two times a day (BID) | ORAL | Status: DC
  Administered 2020-10-24 (×2): 17 g via ORAL
  Filled 2020-10-24 (×2): qty 1

## 2020-10-24 MED ORDER — CEFAZOLIN SODIUM-DEXTROSE 2-4 GM/100ML-% IV SOLN
INTRAVENOUS | Status: AC
  Filled 2020-10-24: qty 100

## 2020-10-24 MED ORDER — INSULIN DETEMIR 100 UNIT/ML ~~LOC~~ SOLN
15.0000 [IU] | Freq: Every day | SUBCUTANEOUS | Status: DC
  Administered 2020-10-24 – 2020-10-26 (×3): 15 [IU] via SUBCUTANEOUS
  Filled 2020-10-24 (×3): qty 0.15

## 2020-10-24 MED ORDER — AMOXICILLIN 500 MG PO CAPS
1000.0000 mg | ORAL_CAPSULE | Freq: Three times a day (TID) | ORAL | Status: DC
  Filled 2020-10-24: qty 2

## 2020-10-24 NOTE — Plan of Care (Signed)
  Problem: Pain Management: Goal: Pain level will decrease Outcome: Progressing   

## 2020-10-24 NOTE — Progress Notes (Addendum)
[place] for Infectious Disease    Date of Admission:  10/18/2020   Total days of antibiotics 5           ID: Colton Bush is a 65 y.o. male with   Principal Problem:   Closed right hip fracture, initial encounter [facility]) Active Problems:   Essential hypertension   Type 2 diabetes mellitus with other specified complication ([place])   Adenocarcinoma of head of pancreas ([place])   Fall from ground level    Subjective: Afebrile, right hip pain- was to get IM nailing but surgery cancelled due to power outage early morning  Medications:  . amLODipine  10 mg Oral Daily  . feeding supplement (GLUCERNA SHAKE)  237 mL Oral TID BM  . insulin aspart  0-15 Units Subcutaneous TID WC  . insulin aspart  0-5 Units Subcutaneous QHS  . insulin detemir  7 Units Subcutaneous QHS  . insulin detemir  7 Units Subcutaneous Daily  . lipase/protease/amylase  24,000 Units Oral TID with meals  . pantoprazole  40 mg Oral Daily  . potassium chloride SA  20 mEq Oral Daily  . rosuvastatin  20 mg Oral Daily  . traZODone  50 mg Oral QHS    Objective: Vital signs in last 24 hours: Temp:  [98 F (36.7 C)-99.1 F (37.3 C)] 99.1 F (37.3 C) (03/27 1131) Pulse Rate:  [75-93] 75 (03/27 1131) Resp:  [16-17] 16 (03/27 1131) BP: (120-143)/(60-68) 120/60 (03/27 1131) SpO2:  [94 %] 94 % (03/27 1131) Physical Exam  Constitutional: He is oriented to person, place, and time. He appears well-developed and well-nourished. No distress.  HENT:  Mouth/Throat: Oropharynx is clear and moist. No oropharyngeal exudate.  Cardiovascular: Normal rate, regular rhythm and normal heart sounds. Exam reveals no gallop and no friction rub.  No murmur heard.  Pulmonary/Chest: Effort normal and breath sounds normal. No respiratory distress. He has no wheezes.  Abdominal: Soft. Bowel sounds are normal. He exhibits no distension. There is no tenderness.  Lymphadenopathy:  He has no cervical adenopathy.  Neurological: He is  alert and oriented to person, place, and time.  Skin: Skin is warm and dry. No rash noted. No erythema.  Psychiatric: He has a normal mood and affect. His behavior is normal.       Lab Results Recent Labs    10/23/20 0321 10/24/20 0251  WBC 12.5* 12.7*  HGB 10.4* 10.7*  HCT 32.0* 32.4*  NA 134* 134*  K 3.6 4.3  CL 103 103  CO2 23 24  BUN 18 22  CREATININE 0.66 0.72   Liver Panel Recent Labs    10/23/20 0321 10/24/20 0251  PROT 7.0 7.0  ALBUMIN 2.2* 2.1*  AST 16 18  ALT 21 21  ALKPHOS 198* 255*  BILITOT 0.8 0.6    Microbiology: reviewed Studies/Results: ECHOCARDIOGRAM COMPLETE  Result Date: 10/22/2020    ECHOCARDIOGRAM REPORT   Patient Name:   Colton Bush Date of Exam: 10/22/2020 Medical Rec #:  [id number]      Height:       70.0 in Accession #:    [id number]     Weight:       196.0 lb Date of Birth:  1956-01-14      BSA:          2.069 m Patient Age:    54 years       BP:           136/69 mmHg Patient Gender: M  HR:           79 bpm. Exam Location:  Inpatient Procedure: 2D Echo, Cardiac Doppler and Color Doppler Indications:    Bacteremia  History:        Patient has no prior history of Echocardiogram examinations.                 Signs/Symptoms:Bacteremia and UTI; Risk Factors:Hypertension,                 Diabetes and Dyslipidemia. Pancreatic cancer.  Sonographer:    Dustin Flock Referring Phys: [phone] A CALDWELL POWELL JR  Sonographer Comments: Image acquisition challenging due to respiratory motion. IMPRESSIONS  1. Left ventricular ejection fraction, by estimation, is 55 to 60%. The left ventricle has normal function. The left ventricle has no regional wall motion abnormalities. There is mild concentric left ventricular hypertrophy. Left ventricular diastolic parameters are consistent with Grade I diastolic dysfunction (impaired relaxation).  2. Right ventricular systolic function is normal. The right ventricular size is normal.  3. The mitral valve is  normal in structure. No evidence of mitral valve regurgitation. No evidence of mitral stenosis.  4. The aortic valve is normal in structure. Aortic valve regurgitation is not visualized. No aortic stenosis is present.  5. The inferior vena cava is normal in size with greater than 50% respiratory variability, suggesting right atrial pressure of 3 mmHg. Conclusion(s)/Recommendation(s): No evidence of valvular vegetations on this transthoracic echocardiogram. Would recommend a transesophageal echocardiogram to exclude infective endocarditis if clinically indicated. FINDINGS  Left Ventricle: Left ventricular ejection fraction, by estimation, is 55 to 60%. The left ventricle has normal function. The left ventricle has no regional wall motion abnormalities. The left ventricular internal cavity size was normal in size. There is  mild concentric left ventricular hypertrophy. Left ventricular diastolic parameters are consistent with Grade I diastolic dysfunction (impaired relaxation). Indeterminate filling pressures. Right Ventricle: The right ventricular size is normal. No increase in right ventricular wall thickness. Right ventricular systolic function is normal. Left Atrium: Left atrial size was normal in size. Right Atrium: Right atrial size was normal in size. Pericardium: There is no evidence of pericardial effusion. Mitral Valve: The mitral valve is normal in structure. Mild mitral annular calcification. No evidence of mitral valve regurgitation. No evidence of mitral valve stenosis. Tricuspid Valve: The tricuspid valve is normal in structure. Tricuspid valve regurgitation is not demonstrated. No evidence of tricuspid stenosis. Aortic Valve: The aortic valve is normal in structure. Aortic valve regurgitation is not visualized. No aortic stenosis is present. Pulmonic Valve: The pulmonic valve was normal in structure. Pulmonic valve regurgitation is not visualized. No evidence of pulmonic stenosis. Aorta: The aortic  root is normal in size and structure. Venous: The inferior vena cava is normal in size with greater than 50% respiratory variability, suggesting right atrial pressure of 3 mmHg. IAS/Shunts: No atrial level shunt detected by color flow Doppler.  LEFT VENTRICLE PLAX 2D LVIDd:         3.90 cm  Diastology LVIDs:         2.70 cm  LV e' medial:    7.72 cm/s LV PW:         1.30 cm  LV E/e' medial:  9.3 LV IVS:        1.30 cm  LV e' lateral:   6.64 cm/s LVOT diam:     2.50 cm  LV E/e' lateral: 10.8 LV SV:         102 LV SV  Index:   49 LVOT Area:     4.91 cm  RIGHT VENTRICLE RV Basal diam:  2.90 cm RV S prime:     10.80 cm/s TAPSE (M-mode): 2.9 cm LEFT ATRIUM             Index       RIGHT ATRIUM           Index LA diam:        3.30 cm 1.59 cm/m  RA Area:     12.20 cm LA Vol (A2C):   54.9 ml 26.53 ml/m RA Volume:   29.40 ml  14.21 ml/m LA Vol (A4C):   29.8 ml 14.40 ml/m LA Biplane Vol: 41.4 ml 20.01 ml/m  AORTIC VALVE LVOT Vmax:   92.40 cm/s LVOT Vmean:  63.500 cm/s LVOT VTI:    0.207 m  AORTA Ao Root diam: 3.20 cm MITRAL VALVE MV Area (PHT): 7.16 cm     SHUNTS MV Decel Time: 106 msec     Systemic VTI:  0.21 m MV E velocity: 71.80 cm/s   Systemic Diam: 2.50 cm MV A velocity: 111.00 cm/s MV E/A ratio:  0.65 Mihai Croitoru MD Electronically signed by Sanda Klein MD Signature Date/Time: 10/22/2020/4:07:33 PM    Final      Assessment/Plan: ecoli uti = treated with ceftriaxone x 5 days  Strep anginosus bacteremia = initially thought to be possible cause of fever and leukocytosis on 3/22 but only isolated in 1/4 bottles and no further repeat positive blood cx. Will finish ceftriaxone today, however his leukocytosis and fever also could have been attributed to uti. Will give 2 days of oral amoxicillin to finish out course of bacteremia  Right hip fracture = to undergo IM nailing possibly tomorrow  Recurrent pancreatic cancer = to follow up with oncology  Will sign off.  [facility]  [facility]fect[facility] Cell: [phone] Pager: [phone]  10/24/2020, 1:50 PM

## 2020-10-24 NOTE — Plan of Care (Signed)
  Problem: Pain Managment: Goal: General experience of comfort will improve Outcome: Progressing   Problem: Safety: Goal: Ability to remain free from injury will improve Outcome: Progressing   

## 2020-10-24 NOTE — Progress Notes (Signed)
Agree with student assessment

## 2020-10-24 NOTE — Interval H&P Note (Signed)
History and Physical Interval Note:  10/24/2020 10:10 AM  Colton Bush  has presented today for surgery, with the diagnosis of RIGHT FEMORAL FRACTURE.  The various methods of treatment have been discussed with the patient and family. After consideration of risks, benefits and other options for treatment, the patient has consented to  Procedure(s): INTRAMEDULLARY (IM) NAIL FEMORAL (Right) as a surgical intervention.  The patient's history has been reviewed, patient examined, no change in status, stable for surgery.  I have reviewed the patient's chart and labs.  Questions were answered to the patient's satisfaction.    Discussed the patient with Dr. Marcelline Deist. Blood culture from 3/25 NGTD and safe to proceed.  The risks, benefits, and alternatives were discussed with the patient. There are risks associated with the surgery including, but not limited to, problems with anesthesia (death), infection, differences in leg length/angulation/rotation, fracture of bones, loosening or failure of implants, malunion, nonunion, hematoma (blood accumulation) which may require surgical drainage, blood clots, pulmonary embolism, nerve injury (foot drop), and blood vessel injury. The patient understands these risks and elects to proceed.   Hilton Cork Bayden Gil

## 2020-10-24 NOTE — Progress Notes (Signed)
PROGRESS NOTE    Colton Bush  LNL:[id number] DOB: 1955-08-04 DOA: 10/18/2020 PCP: [name], MD   Chief Complaint  Patient presents with  . Fall   Brief Narrative:  ARVELL PULSIFER is Lacreshia Bondarenko 65 y.o. male with Alyxandria Wentz history of recurrent pancreatic cancer, diabetes mellitus, hypertension, hyperlipidemia. Patient presented secondary to Tirsa Gail fall and subsequent right hip fracture. Plan for surgery.  Assessment & Plan:   Principal Problem:   Closed right hip fracture, initial encounter [facility]) Active Problems:   Essential hypertension   Type 2 diabetes mellitus with other specified complication ([place])   Adenocarcinoma of head of pancreas ([place])   Fall from ground level  Strep Anginosis Bacteremia  E. Coli Urinary Tract Infection   SIRS Fever 3/22 PM as well as worsened leukocytosis 3/23 -> seems out of proportion to what would be expected with hip fx due to reactive leukocytosis or simple UTI.   Blood cx with 1/4 with strep anginosis - unclear significance Repeat blood cx 3/25 AM NGTD - appears he's been scheduled for OR at 0954 3/27 am - if blood cultures from 3/25 remain NGTD at that time, ok to proceed (discussed with micro this AM, nothing new as of time of my note). E. Coli UTI, pan sensitive Continue ceftriaxone, defer abx to ID  Will consult ID, appreciate assistance -> recommending TTE (EF 40-81%, grade 1 diastolic dysfunction - without evidence of valvular vegetations - see report).  Need repeat cx NGTD x 48 hrs prior to surgery.  Discussed with ID 3/26, noted ok with surgery 3/27 if cx remain ngtd - no need for additional echo.   Right hip fracture Right comminuted/angulated intertrochanteric fracture. Orthopedic surgery consulted.  Surgery likely today -Orthopedic surgery recommendations: Surgery -Continue oxycodone and morphine prn  Fall Resulting in above. No syncope.  Diabetes mellitus, type 2 Patient is on Levemir and Novolog sliding scale as an outpatient. Conflicting  diagnoses on chart review between type 1 and type 2. Started on Levemir 15 units BID. - continue levemir BID here - hyperglycemic today, adjust as needed -Continue Levemir and SSI  Pancreatic cancer Recurrence. Patient follows with Dr. [name] as an outpatient. Plan for radiation therapy as an outpatient. Patient plans to follow up with Dr. [name], radiation oncology. -Continue Creon -added Dr. [name] to treatment team - see 3/24 note  Primary hypertension -Continue amlodipine  Hyperlipidemia -Continue Crestor  GERD -Continue Protonix  DVT prophylaxis: heparin on hold for surgery as of last night - post op anticoagulation per ortho  Code Status: full  Family Communication: none at bedside Disposition:   Status is: Inpatient  Remains inpatient appropriate because:Inpatient level of care appropriate due to severity of illness   Dispo: The patient is from: Home              Anticipated d/c is to: pending              Patient currently is not medically stable to d/c.   Difficult to place patient No   Consultants:   ortho  Procedures: Echo 1. Left ventricular ejection fraction, by estimation, is 55 to 60%. The  left ventricle has normal function. The left ventricle has no regional  wall motion abnormalities. There is mild concentric left ventricular  hypertrophy. Left ventricular diastolic  parameters are consistent with Grade I diastolic dysfunction (impaired  relaxation).  2. Right ventricular systolic function is normal. The right ventricular  size is normal.  3. The mitral valve is normal in  structure. No evidence of mitral valve  regurgitation. No evidence of mitral stenosis.  4. The aortic valve is normal in structure. Aortic valve regurgitation is  not visualized. No aortic stenosis is present.  5. The inferior vena cava is normal in size with greater than 50%  respiratory variability, suggesting right atrial pressure of 3 mmHg.   Antimicrobials:   Anti-infectives (From admission, onward)   Start     Dose/Rate Route Frequency Ordered Stop   10/22/20 1000  cefTRIAXone (ROCEPHIN) 2 g in sodium chloride 0.9 % 100 mL IVPB        2 g 200 mL/hr over 30 Minutes Intravenous Every 24 hours 10/21/20 1343     10/21/20 1400  cefTRIAXone (ROCEPHIN) 1 g in sodium chloride 0.9 % 100 mL IVPB        1 g 200 mL/hr over 30 Minutes Intravenous  Once 10/21/20 1343 10/21/20 1624   10/21/20 1330  ceFAZolin (ANCEF) IVPB 2g/100 mL premix  Status:  Discontinued        2 g 200 mL/hr over 30 Minutes Intravenous On call to O.R. 10/21/20 1323 10/21/20 1350   10/21/20 1330  ceFAZolin (ANCEF) IVPB 2g/100 mL premix  Status:  Discontinued        2 g 200 mL/hr over 30 Minutes Intravenous  Once 10/21/20 1324 10/21/20 1325   10/21/20 1300  ceFAZolin (ANCEF) 2-4 GM/100ML-% IVPB       Note to Pharmacy: [name]   : cabinet override      10/21/20 1300 10/22/20 0114   10/20/20 1230  cefTRIAXone (ROCEPHIN) 1 g in sodium chloride 0.9 % 100 mL IVPB  Status:  Discontinued        1 g 200 mL/hr over 30 Minutes Intravenous Every 24 hours 10/20/20 1141 10/21/20 1354   10/20/20 0600  ceFAZolin (ANCEF) IVPB 2g/100 mL premix        2 g 200 mL/hr over 30 Minutes Intravenous On call to O.R. 10/19/20 2233 10/21/20 0559     Subjective: Didn't sleep well last night Hoping his pain will be better post op  Objective: Vitals:   10/23/20 0623 10/23/20 1309 10/23/20 2100 10/24/20 0502  BP: 127/65 (!) 110/57 139/66 (!) 143/68  Pulse: 77 69 93 85  Resp: 18 17 16 17   Temp: 98.3 F (36.8 C) 97.8 F (36.6 C) 98 F (36.7 C) 99 F (37.2 C)  TempSrc: Oral Oral Oral Oral  SpO2: 95% 96% 94% 94%  Weight:      Height:        Intake/Output Summary (Last 24 hours) at 10/24/2020 0856 Last data filed at 10/24/2020 0838 Gross per 24 hour  Intake 1540 ml  Output 2600 ml  Net -1060 ml   Filed Weights   10/18/20 2330 10/21/20 1326  Weight: 88.9 kg 88.9 kg     Examination:  General: No acute distress. Cardiovascular: Heart sounds show Penne Rosenstock regular rate, and rhythm Lungs: Clear to auscultation bilaterally  Abdomen: Soft, nontender, nondistended Neurological: Alert and oriented 3. Moves all extremities 4. Cranial nerves II through XII grossly intact. Skin: Warm and dry. No rashes or lesions. Extremities: RLE shortened, externally rotated - hip TTP (R)    Data Reviewed: I have personally reviewed following labs and imaging studies  CBC: Recent Labs  Lab 10/18/20 1911 10/19/20 0259 10/20/20 0318 10/21/20 0305 10/22/20 0307 10/23/20 0321 10/24/20 0251  WBC 23.6*   < > 32.4* 16.1* 13.6* 12.5* 12.7*  NEUTROABS 21.4*  --   --  13.4* 10.9* 9.5* 10.2*  HGB 11.6*   < > 10.4* 10.0* 10.3* 10.4* 10.7*  HCT 34.4*   < > 30.7* 30.1* 31.5* 32.0* 32.4*  MCV 90.1   < > 89.2 90.1 90.0 91.7 90.5  PLT 366   < > 385 406* 446* 511* 554*   < > = values in this interval not displayed.    Basic Metabolic Panel: Recent Labs  Lab 10/19/20 0259 10/20/20 0318 10/21/20 0305 10/22/20 0307 10/23/20 0321 10/24/20 0251  NA 135 138 133* 133* 134* 134*  K 3.4* 3.5 3.4* 3.6 3.6 4.3  CL 102 105 102 103 103 103  CO2 27 26 24 23 23 24   GLUCOSE 216* 202* 217* 189* 195* 336*  BUN 14 20 19 15 18 22   CREATININE 0.53* 0.91 0.78 0.76 0.66 0.72  CALCIUM 7.9* 7.9* 7.5* 7.7* 7.9* 7.9*  MG 2.1  --  2.2 2.0 2.2 2.1  PHOS 2.8  --  2.6 2.7 2.8 2.8    GFR: Estimated Creatinine Clearance: 100.6 mL/min (by C-G formula based on SCr of 0.72 mg/dL).  Liver Function Tests: Recent Labs  Lab 10/20/20 0318 10/21/20 0305 10/22/20 0307 10/23/20 0321 10/24/20 0251  AST 19 31 22 16 18   ALT 23 25 25 21 21   ALKPHOS 254* 222* 210* 198* 255*  BILITOT 1.0 0.7 0.8 0.8 0.6  PROT 6.6 6.5 7.0 7.0 7.0  ALBUMIN 2.1* 2.1* 2.2* 2.2* 2.1*    CBG: Recent Labs  Lab 10/23/20 0732 10/23/20 1107 10/23/20 1644 10/23/20 2043 10/24/20 0752  GLUCAP 192* 270* 264* 308* 308*      Recent Results (from the past 240 hour(s))  Resp Panel by RT-PCR (Flu Kiylah Loyer&B, Covid) Nasopharyngeal Swab     Status: None   Collection Time: 10/18/20  7:11 PM   Specimen: Nasopharyngeal Swab; Nasopharyngeal(NP) swabs in vial transport medium  Result Value Ref Range Status   SARS Coronavirus 2 by RT PCR NEGATIVE NEGATIVE Final    Comment: (NOTE) SARS-CoV-2 target nucleic acids are NOT DETECTED.  The SARS-CoV-2 RNA is generally detectable in upper respiratory specimens during the acute phase of infection. The lowest concentration of SARS-CoV-2 viral copies this assay can detect is 138 copies/mL. Oriel Rumbold negative result does not preclude SARS-Cov-2 infection and should not be used as the sole basis for treatment or other patient management decisions. Daaiyah Baumert negative result may occur with  improper specimen collection/handling, submission of specimen other than nasopharyngeal swab, presence of viral mutation(s) within the areas targeted by this assay, and inadequate number of viral copies(<138 copies/mL). Mikhala Kenan negative result must be combined with clinical observations, patient history, and epidemiological information. The expected result is Negative.  Fact Sheet for Patients:  EntrepreneurPulse.com.au  Fact Sheet for Healthcare Providers:  IncredibleEmployment.be  This test is no t yet approved or cleared by the [place] FDA and  has been authorized for detection and/or diagnosis of SARS-CoV-2 by FDA under an Emergency Use Authorization (EUA). This EUA will remain  in effect (meaning this test can be used) for the duration of the COVID-19 declaration under Section 564(b)(1) of the Act, 21 U.S.C.section 360bbb-3(b)(1), unless the authorization is terminated  or revoked sooner.       Influenza Emireth Cockerham by PCR NEGATIVE NEGATIVE Final   Influenza B by PCR NEGATIVE NEGATIVE Final    Comment: (NOTE) The Xpert Xpress SARS-CoV-2/FLU/RSV plus assay is intended as  an aid in the diagnosis of influenza from Nasopharyngeal swab specimens and should not be used as Doll Frazee sole  basis for treatment. Nasal washings and aspirates are unacceptable for Xpert Xpress SARS-CoV-2/FLU/RSV testing.  Fact Sheet for Patients: EntrepreneurPulse.com.au  Fact Sheet for Healthcare Providers: IncredibleEmployment.be  This test is not yet approved or cleared by the [place] FDA and has been authorized for detection and/or diagnosis of SARS-CoV-2 by FDA under an Emergency Use Authorization (EUA). This EUA will remain in effect (meaning this test can be used) for the duration of the COVID-19 declaration under Section 564(b)(1) of the Act, 21 U.S.C. section 360bbb-3(b)(1), unless the authorization is terminated or revoked.  Performed at [facility], [place] [address], [place], [place] [address]   Surgical pcr screen     Status: Abnormal   Collection Time: 10/19/20 12:47 AM   Specimen: Nasal Mucosa; Nasal Swab  Result Value Ref Range Status   MRSA, PCR NEGATIVE NEGATIVE Final   Staphylococcus aureus POSITIVE (Jannatul Wojdyla) NEGATIVE Final    Comment: RESULT CALLED TO, READ BACK BY AND VERIFIED WITH: BOBBY, RN @ [phone] ON 10/19/20 C VARNER (NOTE) The Xpert SA Assay (FDA approved for NASAL specimens in patients 17 years of age and older), is one component of Johnathen Testa comprehensive surveillance program. It is not intended to diagnose infection nor to guide or monitor treatment. Performed at [facility], [place] [address] [place], [place] [address]   Culture, blood (routine x 2)     Status: None (Preliminary result)   Collection Time: 10/20/20  7:55 AM   Specimen: BLOOD  Result Value Ref Range Status   Specimen Description   Final    BLOOD RIGHT ANTECUBITAL Performed at [facility] [address] [place], [place] [address]    Special Requests   Final    BOTTLES DRAWN AEROBIC AND ANAEROBIC  Blood Culture results may not be optimal due to an inadequate volume of blood received in culture bottles Performed at [facility] [address], [place], [place] [address]    Culture   Final    NO GROWTH 3 DAYS Performed at [facility], [place] [address] [place], [place] [address]    Report Status PENDING  Incomplete  Culture, blood (routine x 2)     Status: Abnormal   Collection Time: 10/20/20  7:56 AM   Specimen: BLOOD  Result Value Ref Range Status   Specimen Description   Final    BLOOD RIGHT ANTECUBITAL Performed at [facility] [address], [place], [place] [address]    Special Requests   Final    BOTTLES DRAWN AEROBIC AND ANAEROBIC Blood Culture results may not be optimal due to an inadequate volume of blood received in culture bottles Performed at [facility] [address] [place], [place] [address]    Culture  Setup Time   Final    AEROBIC BOTTLE ONLY GRAM POSITIVE COCCI CRITICAL RESULT CALLED TO, READ BACK BY AND VERIFIED WITHMelodye Ped [facility] 0102 10/21/20 Juaquin Ludington BROWNING Performed at [facility], [place] [address], , [place] [address]    Culture STREPTOCOCCUS ANGINOSIS (Neenah Canter)  Final   Report Status 10/24/2020 FINAL  Final   Organism ID, Bacteria STREPTOCOCCUS ANGINOSIS  Final      Susceptibility   Streptococcus anginosis - MIC*    PENICILLIN <=0.06 SENSITIVE Sensitive     CEFTRIAXONE 0.5 SENSITIVE Sensitive     ERYTHROMYCIN <=0.12 SENSITIVE Sensitive     LEVOFLOXACIN 0.5 SENSITIVE Sensitive     VANCOMYCIN 1 SENSITIVE Sensitive     *  STREPTOCOCCUS ANGINOSIS  Blood Culture ID Panel (Reflexed)     Status: Abnormal   Collection Time: 10/20/20  7:56 AM  Result Value Ref Range Status   Enterococcus faecalis NOT DETECTED NOT DETECTED Final   Enterococcus Faecium NOT DETECTED NOT DETECTED Final   Listeria monocytogenes NOT DETECTED NOT DETECTED Final   Staphylococcus species NOT DETECTED NOT  DETECTED Final   Staphylococcus aureus (BCID) NOT DETECTED NOT DETECTED Final   Staphylococcus epidermidis NOT DETECTED NOT DETECTED Final   Staphylococcus lugdunensis NOT DETECTED NOT DETECTED Final   Streptococcus species DETECTED (Tran Arzuaga) NOT DETECTED Final    Comment: Not Enterococcus species, Streptococcus agalactiae, Streptococcus pyogenes, or Streptococcus pneumoniae. CRITICAL RESULT CALLED TO, READ BACK BY AND VERIFIED WITH: Melodye Ped PHARMD 1316 10/21/20 Aaron Boeh BROWNING    Streptococcus agalactiae NOT DETECTED NOT DETECTED Final   Streptococcus pneumoniae NOT DETECTED NOT DETECTED Final   Streptococcus pyogenes NOT DETECTED NOT DETECTED Final   Keslie Gritz.calcoaceticus-baumannii NOT DETECTED NOT DETECTED Final   Bacteroides fragilis NOT DETECTED NOT DETECTED Final   Enterobacterales NOT DETECTED NOT DETECTED Final   Enterobacter cloacae complex NOT DETECTED NOT DETECTED Final   Escherichia coli NOT DETECTED NOT DETECTED Final   Klebsiella aerogenes NOT DETECTED NOT DETECTED Final   Klebsiella oxytoca NOT DETECTED NOT DETECTED Final   Klebsiella pneumoniae NOT DETECTED NOT DETECTED Final   Proteus species NOT DETECTED NOT DETECTED Final   Salmonella species NOT DETECTED NOT DETECTED Final   Serratia marcescens NOT DETECTED NOT DETECTED Final   Haemophilus influenzae NOT DETECTED NOT DETECTED Final   Neisseria meningitidis NOT DETECTED NOT DETECTED Final   Pseudomonas aeruginosa NOT DETECTED NOT DETECTED Final   Stenotrophomonas maltophilia NOT DETECTED NOT DETECTED Final   Candida albicans NOT DETECTED NOT DETECTED Final   Candida auris NOT DETECTED NOT DETECTED Final   Candida glabrata NOT DETECTED NOT DETECTED Final   Candida krusei NOT DETECTED NOT DETECTED Final   Candida parapsilosis NOT DETECTED NOT DETECTED Final   Candida tropicalis NOT DETECTED NOT DETECTED Final   Cryptococcus neoformans/gattii NOT DETECTED NOT DETECTED Final    Comment: Performed at [facility], [address] [place], [place] [address]  Culture, Urine     Status: Abnormal   Collection Time: 10/20/20 10:25 AM   Specimen: Urine, Clean Catch  Result Value Ref Range Status   Specimen Description   Final    URINE, CLEAN CATCH Performed at [facility], [place] [address] [place], [place] [address]    Special Requests   Final    NONE Performed at [facility], [place] [address], [place], [place] [address]    Culture >=100,000 COLONIES/mL ESCHERICHIA COLI (Betsabe Iglesia)  Final   Report Status 10/23/2020 FINAL  Final   Organism ID, Bacteria ESCHERICHIA COLI (Donivin Wirt)  Final      Susceptibility   Escherichia coli - MIC*    AMPICILLIN 4 SENSITIVE Sensitive     CEFAZOLIN <=4 SENSITIVE Sensitive     CEFEPIME <=0.12 SENSITIVE Sensitive     CEFTRIAXONE <=0.25 SENSITIVE Sensitive     CIPROFLOXACIN <=0.25 SENSITIVE Sensitive     GENTAMICIN <=1 SENSITIVE Sensitive     IMIPENEM <=0.25 SENSITIVE Sensitive     NITROFURANTOIN <=16 SENSITIVE Sensitive     TRIMETH/SULFA <=20 SENSITIVE Sensitive     AMPICILLIN/SULBACTAM <=2 SENSITIVE Sensitive     PIP/TAZO <=4 SENSITIVE Sensitive     * >=100,000 COLONIES/mL ESCHERICHIA COLI  Culture, blood (routine x 2)  Status: None (Preliminary result)   Collection Time: 10/22/20  7:26 AM   Specimen: BLOOD  Result Value Ref Range Status   Specimen Description   Final    BLOOD LEFT ANTECUBITAL Performed at [facility] [address], [place], [place] [address]    Special Requests   Final    BOTTLES DRAWN AEROBIC ONLY Blood Culture results may not be optimal due to an inadequate volume of blood received in culture bottles Performed at [facility] [address] [place], [place] [address]    Culture   Final    NO GROWTH 1 DAY Performed at [facility], [place] [address], [place], [place] [address]    Report Status PENDING  Incomplete  Culture, blood (routine x 2)     Status: None (Preliminary result)    Collection Time: 10/22/20  7:26 AM   Specimen: BLOOD  Result Value Ref Range Status   Specimen Description   Final    BLOOD LEFT ANTECUBITAL Performed at [facility] [address] [place], [place] [address]    Special Requests   Final    BOTTLES DRAWN AEROBIC ONLY Blood Culture results may not be optimal due to an inadequate volume of blood received in culture bottles Performed at [facility] [address], [place], [place] [address]    Culture   Final    NO GROWTH 1 DAY Performed at [facility], [place] [address], [place],  [address]    Report Status PENDING  Incomplete         Radiology Studies: ECHOCARDIOGRAM COMPLETE  Result Date: 10/22/2020    ECHOCARDIOGRAM REPORT   Patient Name:   IRMA DELANCEY Date of Exam: 10/22/2020 Medical Rec #:  [id number]      Height:       70.0 in Accession #:    [id number]     Weight:       196.0 lb Date of Birth:  Sep 14, 1955      BSA:          2.069 m Patient Age:    65 years       BP:           136/69 mmHg Patient Gender: M              HR:           79 bpm. Exam Location:  Inpatient Procedure: 2D Echo, Cardiac Doppler and Color Doppler Indications:    Bacteremia  History:        Patient has no prior history of Echocardiogram examinations.                 Signs/Symptoms:Bacteremia and UTI; Risk Factors:Hypertension,                 Diabetes and Dyslipidemia. Pancreatic cancer.  Sonographer:    Dustin Flock Referring Phys: [phone] Jessamy Torosyan CALDWELL POWELL JR  Sonographer Comments: Image acquisition challenging due to respiratory motion. IMPRESSIONS  1. Left ventricular ejection fraction, by estimation, is 55 to 60%. The left ventricle has normal function. The left ventricle has no regional wall motion abnormalities. There is mild concentric left ventricular hypertrophy. Left ventricular diastolic parameters are consistent with Grade I diastolic dysfunction (impaired relaxation).  2. Right ventricular  systolic function is normal. The right ventricular size is normal.  3. The mitral valve is normal in structure. No evidence of mitral valve regurgitation. No evidence of mitral  stenosis.  4. The aortic valve is normal in structure. Aortic valve regurgitation is not visualized. No aortic stenosis is present.  5. The inferior vena cava is normal in size with greater than 50% respiratory variability, suggesting right atrial pressure of 3 mmHg. Conclusion(s)/Recommendation(s): No evidence of valvular vegetations on this transthoracic echocardiogram. Would recommend Cason Dabney transesophageal echocardiogram to exclude infective endocarditis if clinically indicated. FINDINGS  Left Ventricle: Left ventricular ejection fraction, by estimation, is 55 to 60%. The left ventricle has normal function. The left ventricle has no regional wall motion abnormalities. The left ventricular internal cavity size was normal in size. There is  mild concentric left ventricular hypertrophy. Left ventricular diastolic parameters are consistent with Grade I diastolic dysfunction (impaired relaxation). Indeterminate filling pressures. Right Ventricle: The right ventricular size is normal. No increase in right ventricular wall thickness. Right ventricular systolic function is normal. Left Atrium: Left atrial size was normal in size. Right Atrium: Right atrial size was normal in size. Pericardium: There is no evidence of pericardial effusion. Mitral Valve: The mitral valve is normal in structure. Mild mitral annular calcification. No evidence of mitral valve regurgitation. No evidence of mitral valve stenosis. Tricuspid Valve: The tricuspid valve is normal in structure. Tricuspid valve regurgitation is not demonstrated. No evidence of tricuspid stenosis. Aortic Valve: The aortic valve is normal in structure. Aortic valve regurgitation is not visualized. No aortic stenosis is present. Pulmonic Valve: The pulmonic valve was normal in structure. Pulmonic  valve regurgitation is not visualized. No evidence of pulmonic stenosis. Aorta: The aortic root is normal in size and structure. Venous: The inferior vena cava is normal in size with greater than 50% respiratory variability, suggesting right atrial pressure of 3 mmHg. IAS/Shunts: No atrial level shunt detected by color flow Doppler.  LEFT VENTRICLE PLAX 2D LVIDd:         3.90 cm  Diastology LVIDs:         2.70 cm  LV e' medial:    7.72 cm/s LV PW:         1.30 cm  LV E/e' medial:  9.3 LV IVS:        1.30 cm  LV e' lateral:   6.64 cm/s LVOT diam:     2.50 cm  LV E/e' lateral: 10.8 LV SV:         102 LV SV Index:   49 LVOT Area:     4.91 cm  RIGHT VENTRICLE RV Basal diam:  2.90 cm RV S prime:     10.80 cm/s TAPSE (M-mode): 2.9 cm LEFT ATRIUM             Index       RIGHT ATRIUM           Index LA diam:        3.30 cm 1.59 cm/m  RA Area:     12.20 cm LA Vol (A2C):   54.9 ml 26.53 ml/m RA Volume:   29.40 ml  14.21 ml/m LA Vol (A4C):   29.8 ml 14.40 ml/m LA Biplane Vol: 41.4 ml 20.01 ml/m  AORTIC VALVE LVOT Vmax:   92.40 cm/s LVOT Vmean:  63.500 cm/s LVOT VTI:    0.207 m  AORTA Ao Root diam: 3.20 cm MITRAL VALVE MV Area (PHT): 7.16 cm     SHUNTS MV Decel Time: 106 msec     Systemic VTI:  0.21 m MV E velocity: 71.80 cm/s   Systemic Diam: 2.50 cm MV Tomasa Dobransky velocity: 111.00 cm/s MV  E/Jayd Forrey ratio:  0.65 Mihai Croitoru MD Electronically signed by Sanda Klein MD Signature Date/Time: 10/22/2020/4:07:33 PM    Final         Scheduled Meds: . amLODipine  10 mg Oral Daily  . feeding supplement (GLUCERNA SHAKE)  237 mL Oral TID BM  . insulin aspart  0-15 Units Subcutaneous TID WC  . insulin aspart  0-5 Units Subcutaneous QHS  . insulin detemir  7 Units Subcutaneous QHS  . insulin detemir  7 Units Subcutaneous Daily  . lipase/protease/amylase  24,000 Units Oral TID with meals  . pantoprazole  40 mg Oral Daily  . potassium chloride SA  20 mEq Oral Daily  . rosuvastatin  20 mg Oral Daily  . traZODone  50 mg Oral QHS    Continuous Infusions: . cefTRIAXone (ROCEPHIN)  IV 2 g (10/23/20 0908)  . methocarbamol (ROBAXIN) IV 500 mg (10/20/20 2319)     LOS: 6 days    Time spent: over 17 min    Fayrene Helper, MD [facility]   To contact the attending provider between 7A-7P or the covering provider during after hours 7P-7A, please log into the web site [email or web address] and access using universal [place] password for that web site. If you do not have the password, please call the hospital operator.  10/24/2020, 8:56 AM

## 2020-10-24 NOTE — Progress Notes (Signed)
Pt back from pacu. Pt case cancelled due to power outage. Pt stable on arrival to floor.

## 2020-10-24 NOTE — Progress Notes (Signed)
pacu called and informed that pt wife is in room and would like md to call when surgery is complete for update.

## 2020-10-24 NOTE — Progress Notes (Signed)
Pt down to pacu in stable condition. No needs at time of transfer to pacu. Rn gave report to pacu rn.

## 2020-10-24 NOTE — Progress Notes (Signed)
Patient resting in bed, night meds given including something for pain and sleep, patient refusing scd's tonight and does not want woken up or bothered throughout the night, vitals taken and stable, will do routine rounding but not interrupt sleep, pt aware he is npo after midnight, will monitor.

## 2020-10-25 ENCOUNTER — Inpatient Hospital Stay (HOSPITAL_COMMUNITY): Payer: Medicare Other | Admitting: Anesthesiology

## 2020-10-25 ENCOUNTER — Inpatient Hospital Stay (HOSPITAL_COMMUNITY): Payer: Medicare Other

## 2020-10-25 ENCOUNTER — Encounter (HOSPITAL_COMMUNITY)
Admission: EM | Disposition: A | Discharge status: Skilled Nursing Facility | Payer: Self-pay | Source: Home / Self Care | Attending: Family Medicine

## 2020-10-25 ENCOUNTER — Encounter (HOSPITAL_COMMUNITY): Payer: Self-pay | Admitting: Internal Medicine

## 2020-10-25 DIAGNOSIS — S72001A Fracture of unspecified part of neck of right femur, initial encounter for closed fracture: Secondary | ICD-10-CM | POA: Diagnosis not present

## 2020-10-25 HISTORY — PX: FEMUR IM NAIL: SHX1597

## 2020-10-25 LAB — COMPREHENSIVE METABOLIC PANEL
ALT: 18 U/L (ref 0–44)
AST: 12 U/L — ABNORMAL LOW (ref 15–41)
Albumin: 2.1 g/dL — ABNORMAL LOW (ref 3.5–5.0)
Alkaline Phosphatase: 218 U/L — ABNORMAL HIGH (ref 38–126)
Anion gap: 6 (ref 5–15)
BUN: 20 mg/dL (ref 8–23)
CO2: 24 mmol/L (ref 22–32)
Calcium: 8 mg/dL — ABNORMAL LOW (ref 8.9–10.3)
Chloride: 102 mmol/L (ref 98–111)
Creatinine, Ser: 0.62 mg/dL (ref 0.61–1.24)
GFR, Estimated: >60 mL/min (ref >60)
Glucose, Bld: 312 mg/dL — ABNORMAL HIGH (ref 70–99)
Potassium: 4.2 mmol/L (ref 3.5–5.1)
Sodium: 132 mmol/L — ABNORMAL LOW (ref 135–145)
Total Bilirubin: 0.5 mg/dL (ref 0.3–1.2)
Total Protein: 6.9 g/dL (ref 6.5–8.1)

## 2020-10-25 LAB — CBC WITH DIFFERENTIAL/PLATELET
Abs Immature Granulocytes: 0.13 10*3/uL — ABNORMAL HIGH (ref 0.00–0.07)
Basophils Absolute: 0.1 10*3/uL (ref 0.0–0.1)
Basophils Relative: 1 %
Eosinophils Absolute: 0.3 10*3/uL (ref 0.0–0.5)
Eosinophils Relative: 2 %
HCT: 31.3 % — ABNORMAL LOW (ref 39.0–52.0)
Hemoglobin: 10.3 g/dL — ABNORMAL LOW (ref 13.0–17.0)
Immature Granulocytes: 1 %
Lymphocytes Relative: 9 %
Lymphs Abs: 1.3 10*3/uL (ref 0.7–4.0)
MCH: 30.4 pg (ref 26.0–34.0)
MCHC: 32.9 g/dL (ref 30.0–36.0)
MCV: 92.3 fL (ref 80.0–100.0)
Monocytes Absolute: 0.9 10*3/uL (ref 0.1–1.0)
Monocytes Relative: 6 %
Neutro Abs: 12 10*3/uL — ABNORMAL HIGH (ref 1.7–7.7)
Neutrophils Relative %: 81 %
Platelets: 536 10*3/uL — ABNORMAL HIGH (ref 150–400)
RBC: 3.39 MIL/uL — ABNORMAL LOW (ref 4.22–5.81)
RDW: 15 % (ref 11.5–15.5)
WBC: 14.7 10*3/uL — ABNORMAL HIGH (ref 4.0–10.5)
nRBC: 0 % (ref 0.0–0.2)

## 2020-10-25 LAB — GLUCOSE, CAPILLARY
Glucose-Capillary: 273 mg/dL — ABNORMAL HIGH (ref 70–99)
Glucose-Capillary: 243 mg/dL — ABNORMAL HIGH (ref 70–99)
Glucose-Capillary: 227 mg/dL — ABNORMAL HIGH (ref 70–99)
Glucose-Capillary: 236 mg/dL — ABNORMAL HIGH (ref 70–99)
Glucose-Capillary: 229 mg/dL — ABNORMAL HIGH (ref 70–99)
Glucose-Capillary: 243 mg/dL — ABNORMAL HIGH (ref 70–99)

## 2020-10-25 LAB — CULTURE, BLOOD (ROUTINE X 2): Culture: NO GROWTH

## 2020-10-25 LAB — PHOSPHORUS: Phosphorus: 3.1 mg/dL (ref 2.5–4.6)

## 2020-10-25 LAB — MAGNESIUM: Magnesium: 2 mg/dL (ref 1.7–2.4)

## 2020-10-25 SURGERY — INSERTION, INTRAMEDULLARY ROD, FEMUR
Anesthesia: General | Laterality: Right

## 2020-10-25 MED ORDER — ENOXAPARIN SODIUM 40 MG/0.4ML ~~LOC~~ SOLN
40.0000 mg | SUBCUTANEOUS | Status: DC
  Administered 2020-10-26 – 2020-10-28 (×3): 40 mg via SUBCUTANEOUS
  Filled 2020-10-25 (×3): qty 0.4

## 2020-10-25 MED ORDER — METOCLOPRAMIDE HCL 5 MG PO TABS: 5.0000 mg | ORAL_TABLET | Freq: Three times a day (TID) | ORAL | Status: DC | PRN

## 2020-10-25 MED ORDER — CHLORHEXIDINE GLUCONATE CLOTH 2 % EX PADS
6.0000 | MEDICATED_PAD | Freq: Every day | CUTANEOUS | Status: DC
  Administered 2020-10-25: 6 via TOPICAL

## 2020-10-25 MED ORDER — LACTATED RINGERS IV SOLN: INTRAVENOUS | Status: DC

## 2020-10-25 MED ORDER — LIDOCAINE 2% (20 MG/ML) 5 ML SYRINGE
INTRAMUSCULAR | Status: AC
  Filled 2020-10-25: qty 5

## 2020-10-25 MED ORDER — ONDANSETRON HCL 4 MG/2ML IJ SOLN: 4.0000 mg | Freq: Once | INTRAMUSCULAR | Status: DC | PRN

## 2020-10-25 MED ORDER — SUCCINYLCHOLINE CHLORIDE 200 MG/10ML IV SOSY
PREFILLED_SYRINGE | INTRAVENOUS | Status: DC | PRN
  Administered 2020-10-25: 140 mg via INTRAVENOUS

## 2020-10-25 MED ORDER — ACETAMINOPHEN 10 MG/ML IV SOLN
INTRAVENOUS | Status: AC
  Filled 2020-10-25: qty 100

## 2020-10-25 MED ORDER — ONDANSETRON HCL 4 MG/2ML IJ SOLN: 4.0000 mg | Freq: Four times a day (QID) | INTRAMUSCULAR | Status: DC | PRN

## 2020-10-25 MED ORDER — ONDANSETRON HCL 4 MG/2ML IJ SOLN
INTRAMUSCULAR | Status: AC
  Filled 2020-10-25: qty 2

## 2020-10-25 MED ORDER — LIDOCAINE 2% (20 MG/ML) 5 ML SYRINGE
INTRAMUSCULAR | Status: DC | PRN
  Administered 2020-10-25: 40 mg via INTRAVENOUS

## 2020-10-25 MED ORDER — ONDANSETRON HCL 4 MG/2ML IJ SOLN
INTRAMUSCULAR | Status: DC | PRN
  Administered 2020-10-25: 4 mg via INTRAVENOUS

## 2020-10-25 MED ORDER — OXYCODONE HCL 5 MG PO TABS
5.0000 mg | ORAL_TABLET | Freq: Once | ORAL | Status: DC | PRN
Start: 2020-10-25 — End: 2020-10-25

## 2020-10-25 MED ORDER — INSULIN ASPART 100 UNIT/ML ~~LOC~~ SOLN
0.0000 [IU] | SUBCUTANEOUS | Status: DC
  Administered 2020-10-25: 5 [IU] via SUBCUTANEOUS
  Filled 2020-10-25: qty 1

## 2020-10-25 MED ORDER — AMOXICILLIN 500 MG PO CAPS
1000.0000 mg | ORAL_CAPSULE | Freq: Three times a day (TID) | ORAL | Status: AC
  Administered 2020-10-25 – 2020-10-26 (×6): 1000 mg via ORAL
  Filled 2020-10-25 (×6): qty 2

## 2020-10-25 MED ORDER — INSULIN ASPART 100 UNIT/ML ~~LOC~~ SOLN
SUBCUTANEOUS | Status: AC
  Administered 2020-10-25: 5 [IU] via SUBCUTANEOUS
  Filled 2020-10-25: qty 1

## 2020-10-25 MED ORDER — METHOCARBAMOL 500 MG IVPB - SIMPLE MED
500.0000 mg | Freq: Four times a day (QID) | INTRAVENOUS | Status: DC | PRN
  Administered 2020-10-25: 500 mg via INTRAVENOUS
  Filled 2020-10-25: qty 50

## 2020-10-25 MED ORDER — PROPOFOL 10 MG/ML IV BOLUS
INTRAVENOUS | Status: AC
  Filled 2020-10-25: qty 20

## 2020-10-25 MED ORDER — DOCUSATE SODIUM 100 MG PO CAPS
100.0000 mg | ORAL_CAPSULE | Freq: Two times a day (BID) | ORAL | Status: DC
  Administered 2020-10-25 – 2020-10-28 (×6): 100 mg via ORAL
  Filled 2020-10-25 (×6): qty 1

## 2020-10-25 MED ORDER — METHOCARBAMOL 500 MG PO TABS
500.0000 mg | ORAL_TABLET | Freq: Four times a day (QID) | ORAL | Status: DC | PRN
  Administered 2020-10-27: 500 mg via ORAL
  Filled 2020-10-25: qty 1

## 2020-10-25 MED ORDER — 0.9 % SODIUM CHLORIDE (POUR BTL) OPTIME
TOPICAL | Status: DC | PRN
  Administered 2020-10-25: 1000 mL

## 2020-10-25 MED ORDER — INSULIN ASPART 100 UNIT/ML ~~LOC~~ SOLN
0.0000 [IU] | Freq: Every day | SUBCUTANEOUS | Status: DC
  Administered 2020-10-25: 2 [IU] via SUBCUTANEOUS

## 2020-10-25 MED ORDER — CEFAZOLIN SODIUM-DEXTROSE 2-4 GM/100ML-% IV SOLN
2.0000 g | Freq: Four times a day (QID) | INTRAVENOUS | Status: AC
Start: 2020-10-25 — End: 2020-10-26
  Administered 2020-10-25 – 2020-10-26 (×2): 2 g via INTRAVENOUS
  Filled 2020-10-25 (×2): qty 100

## 2020-10-25 MED ORDER — DEXAMETHASONE SODIUM PHOSPHATE 10 MG/ML IJ SOLN
INTRAMUSCULAR | Status: AC
  Filled 2020-10-25: qty 1

## 2020-10-25 MED ORDER — MEPERIDINE HCL 50 MG/ML IJ SOLN: 6.2500 mg | INTRAMUSCULAR | Status: DC | PRN

## 2020-10-25 MED ORDER — LACTATED RINGERS IV SOLN: INTRAVENOUS | Status: DC | PRN

## 2020-10-25 MED ORDER — SENNA 8.6 MG PO TABS
1.0000 | ORAL_TABLET | Freq: Two times a day (BID) | ORAL | Status: DC
  Administered 2020-10-26 – 2020-10-28 (×5): 8.6 mg via ORAL
  Filled 2020-10-25 (×5): qty 1

## 2020-10-25 MED ORDER — FENTANYL CITRATE (PF) 250 MCG/5ML IJ SOLN
INTRAMUSCULAR | Status: DC | PRN
  Administered 2020-10-25 (×5): 50 ug via INTRAVENOUS

## 2020-10-25 MED ORDER — CHLORHEXIDINE GLUCONATE 0.12 % MT SOLN
15.0000 mL | OROMUCOSAL | Status: AC
  Administered 2020-10-25: 15 mL via OROMUCOSAL

## 2020-10-25 MED ORDER — INSULIN ASPART 100 UNIT/ML ~~LOC~~ SOLN
3.0000 [IU] | Freq: Three times a day (TID) | SUBCUTANEOUS | Status: DC
  Administered 2020-10-26 (×3): 3 [IU] via SUBCUTANEOUS

## 2020-10-25 MED ORDER — FENTANYL CITRATE (PF) 250 MCG/5ML IJ SOLN
INTRAMUSCULAR | Status: AC
  Filled 2020-10-25: qty 5

## 2020-10-25 MED ORDER — METHOCARBAMOL 500 MG IVPB - SIMPLE MED
INTRAVENOUS | Status: AC
  Filled 2020-10-25: qty 50

## 2020-10-25 MED ORDER — METOCLOPRAMIDE HCL 5 MG/ML IJ SOLN: 5.0000 mg | Freq: Three times a day (TID) | INTRAMUSCULAR | Status: DC | PRN

## 2020-10-25 MED ORDER — TRANEXAMIC ACID-NACL 1000-0.7 MG/100ML-% IV SOLN
INTRAVENOUS | Status: AC
  Filled 2020-10-25: qty 100

## 2020-10-25 MED ORDER — HYDROMORPHONE HCL 1 MG/ML IJ SOLN
0.2500 mg | INTRAMUSCULAR | Status: DC | PRN
  Administered 2020-10-25 (×3): 0.5 mg via INTRAVENOUS

## 2020-10-25 MED ORDER — MENTHOL 3 MG MT LOZG: 1.0000 | LOZENGE | OROMUCOSAL | Status: DC | PRN

## 2020-10-25 MED ORDER — OXYCODONE HCL 5 MG/5ML PO SOLN
5.0000 mg | Freq: Once | ORAL | Status: DC | PRN
Start: 2020-10-25 — End: 2020-10-25

## 2020-10-25 MED ORDER — ACETAMINOPHEN 10 MG/ML IV SOLN
1000.0000 mg | Freq: Once | INTRAVENOUS | Status: AC
  Administered 2020-10-25: 1000 mg via INTRAVENOUS

## 2020-10-25 MED ORDER — INSULIN DETEMIR 100 UNIT/ML ~~LOC~~ SOLN
15.0000 [IU] | Freq: Every day | SUBCUTANEOUS | Status: DC
  Administered 2020-10-26: 15 [IU] via SUBCUTANEOUS
  Filled 2020-10-25 (×2): qty 0.15

## 2020-10-25 MED ORDER — TRANEXAMIC ACID-NACL 1000-0.7 MG/100ML-% IV SOLN
INTRAVENOUS | Status: DC | PRN
  Administered 2020-10-25: 1000 mg via INTRAVENOUS

## 2020-10-25 MED ORDER — EPHEDRINE SULFATE-NACL 50-0.9 MG/10ML-% IV SOSY
PREFILLED_SYRINGE | INTRAVENOUS | Status: DC | PRN
  Administered 2020-10-25: 20 mg via INTRAVENOUS

## 2020-10-25 MED ORDER — CEFAZOLIN SODIUM-DEXTROSE 2-4 GM/100ML-% IV SOLN
INTRAVENOUS | Status: AC
  Filled 2020-10-25: qty 100

## 2020-10-25 MED ORDER — ONDANSETRON HCL 4 MG PO TABS: 4.0000 mg | ORAL_TABLET | Freq: Four times a day (QID) | ORAL | Status: DC | PRN

## 2020-10-25 MED ORDER — INSULIN ASPART 100 UNIT/ML ~~LOC~~ SOLN
4.0000 [IU] | SUBCUTANEOUS | Status: AC
  Administered 2020-10-25: 4 [IU] via SUBCUTANEOUS

## 2020-10-25 MED ORDER — PROPOFOL 10 MG/ML IV BOLUS
INTRAVENOUS | Status: DC | PRN
  Administered 2020-10-25: 130 mg via INTRAVENOUS
  Administered 2020-10-25: 20 mg via INTRAVENOUS
  Administered 2020-10-25: 10 mg via INTRAVENOUS

## 2020-10-25 MED ORDER — ALBUMIN HUMAN 5 % IV SOLN: INTRAVENOUS | Status: DC | PRN

## 2020-10-25 MED ORDER — ISOPROPYL ALCOHOL 70 % SOLN
Status: DC | PRN
  Administered 2020-10-25: 1 via TOPICAL

## 2020-10-25 MED ORDER — ROCURONIUM BROMIDE 10 MG/ML (PF) SYRINGE
PREFILLED_SYRINGE | INTRAVENOUS | Status: AC
  Filled 2020-10-25: qty 10

## 2020-10-25 MED ORDER — PHENOL 1.4 % MT LIQD: 1.0000 | OROMUCOSAL | Status: DC | PRN

## 2020-10-25 MED ORDER — PHENYLEPHRINE HCL-NACL 10-0.9 MG/250ML-% IV SOLN
INTRAVENOUS | Status: DC | PRN
  Administered 2020-10-25: 50 ug/min via INTRAVENOUS

## 2020-10-25 MED ORDER — CEFAZOLIN SODIUM-DEXTROSE 2-3 GM-%(50ML) IV SOLR
INTRAVENOUS | Status: DC | PRN
  Administered 2020-10-25: 2 g via INTRAVENOUS

## 2020-10-25 MED ORDER — HYDROMORPHONE HCL 1 MG/ML IJ SOLN
INTRAMUSCULAR | Status: AC
  Filled 2020-10-25: qty 1

## 2020-10-25 MED ORDER — DROPERIDOL 2.5 MG/ML IJ SOLN: 0.6250 mg | Freq: Once | INTRAMUSCULAR | Status: DC | PRN

## 2020-10-25 MED ORDER — ISOPROPYL ALCOHOL 70 % SOLN
Status: AC
  Filled 2020-10-25: qty 480

## 2020-10-25 MED ORDER — DEXAMETHASONE SODIUM PHOSPHATE 10 MG/ML IJ SOLN
INTRAMUSCULAR | Status: DC | PRN
  Administered 2020-10-25: 10 mg via INTRAVENOUS

## 2020-10-25 SURGICAL SUPPLY — 54 items
ADH SKN CLS APL DERMABOND .7 (GAUZE/BANDAGES/DRESSINGS) ×1
APL PRP STRL LF DISP 70% ISPRP (MISCELLANEOUS) ×1
BAG SPEC THK2 15X12 ZIP CLS (MISCELLANEOUS)
BAG ZIPLOCK 12X15 (MISCELLANEOUS) IMPLANT
BIT DRILL 4.3MMS DISTAL GRDTED (BIT) IMPLANT
CHLORAPREP W/TINT 26 (MISCELLANEOUS) ×2 IMPLANT
CORTICAL BONE SCR 5.0MM X 46MM (Screw) ×2 IMPLANT
COVER PERINEAL POST (MISCELLANEOUS) ×2 IMPLANT
COVER SURGICAL LIGHT HANDLE (MISCELLANEOUS) ×2 IMPLANT
COVER WAND RF STERILE (DRAPES) IMPLANT
DERMABOND ADVANCED (GAUZE/BANDAGES/DRESSINGS) ×1
DERMABOND ADVANCED .7 DNX12 (GAUZE/BANDAGES/DRESSINGS) ×1 IMPLANT
DRAPE C-ARM 42X120 X-RAY (DRAPES) ×2 IMPLANT
DRAPE C-ARMOR (DRAPES) ×2 IMPLANT
DRAPE SHEET LG 3/4 BI-LAMINATE (DRAPES) ×2 IMPLANT
DRAPE STERI IOBAN 125X83 (DRAPES) ×2 IMPLANT
DRAPE U-SHAPE 47X51 STRL (DRAPES) ×4 IMPLANT
DRILL 4.3MMS DISTAL GRADUATED (BIT) ×2
DRSG AQUACEL AG ADV 3.5X 4 (GAUZE/BANDAGES/DRESSINGS) ×3 IMPLANT
DRSG AQUACEL AG ADV 3.5X 6 (GAUZE/BANDAGES/DRESSINGS) ×2 IMPLANT
DRSG AQUACEL AG ADV 3.5X10 (GAUZE/BANDAGES/DRESSINGS) ×2 IMPLANT
FACESHIELD WRAPAROUND (MASK) ×6 IMPLANT
FACESHIELD WRAPAROUND OR TEAM (MASK) ×3 IMPLANT
GAUZE SPONGE 4X4 12PLY STRL (GAUZE/BANDAGES/DRESSINGS) ×2 IMPLANT
GLOVE BIOGEL M 8.0 STRL (GLOVE) ×6 IMPLANT
GLOVE SRG 8 PF TXTR STRL LF DI (GLOVE) ×1 IMPLANT
GLOVE SURG ENC MOIS LTX SZ8.5 (GLOVE) ×4 IMPLANT
GLOVE SURG UNDER LTX SZ7.5 (GLOVE) ×2 IMPLANT
GLOVE SURG UNDER POLY LF SZ8 (GLOVE) ×2
GLOVE SURG UNDER POLY LF SZ8.5 (GLOVE) ×2 IMPLANT
GOWN SPEC L3 XXLG W/TWL (GOWN DISPOSABLE) ×2 IMPLANT
GOWN STRL REUS W/TWL XL LVL3 (GOWN DISPOSABLE) ×2 IMPLANT
GUIDEPIN VERSANAIL DSP 3.2X444 (ORTHOPEDIC DISPOSABLE SUPPLIES) ×1 IMPLANT
GUIDEWIRE BALL NOSE 100CM (WIRE) ×1 IMPLANT
HIP FR NAIL LAG SCREW 10.5X110 (Orthopedic Implant) ×2 IMPLANT
IRRIGATION SURGIPHOR STRL (IV SOLUTION) IMPLANT
KIT BASIN OR (CUSTOM PROCEDURE TRAY) ×2 IMPLANT
KIT TURNOVER KIT A (KITS) ×2 IMPLANT
MANIFOLD NEPTUNE II (INSTRUMENTS) ×2 IMPLANT
MARKER SKIN DUAL TIP RULER LAB (MISCELLANEOUS) ×2 IMPLANT
NAIL HIP FX RT 11X400-125 (Nail) ×1 IMPLANT
PACK TOTAL JOINT (CUSTOM PROCEDURE TRAY) ×2 IMPLANT
PENCIL SMOKE EVACUATOR (MISCELLANEOUS) IMPLANT
SCREW CORTICL BON 5.0MM X 46MM (Screw) IMPLANT
SCREW LAG HIP FR NAIL 10.5X110 (Orthopedic Implant) IMPLANT
STAPLER VISISTAT 35W (STAPLE) ×1 IMPLANT
SUT MNCRL AB 3-0 PS2 18 (SUTURE) IMPLANT
SUT MON AB 2-0 CT1 36 (SUTURE) ×2 IMPLANT
SUT VIC AB 1 CT1 27 (SUTURE) ×2
SUT VIC AB 1 CT1 27XBRD ANTBC (SUTURE) ×1 IMPLANT
TOWEL OR 17X26 10 PK STRL BLUE (TOWEL DISPOSABLE) ×2 IMPLANT
TOWEL OR NON WOVEN STRL DISP B (DISPOSABLE) ×2 IMPLANT
TRAY FOLEY MTR SLVR 14FR STAT (SET/KITS/TRAYS/PACK) ×2 IMPLANT
YANKAUER SUCT BULB TIP NO VENT (SUCTIONS) ×2 IMPLANT

## 2020-10-25 NOTE — Transfer of Care (Signed)
Immediate Anesthesia Transfer of Care Note  Patient: Colton Bush  Procedure(s) Performed: INTRAMEDULLARY (IM) NAIL FEMORAL (Right )  Patient Location: PACU  Anesthesia Type:General  Level of Consciousness: sedated  Airway & Oxygen Therapy: Patient Spontanous Breathing and Patient connected to face mask oxygen  Post-op Assessment: Report given to RN and Post -op Vital signs reviewed and stable  Post vital signs: Reviewed and stable  Last Vitals:  Vitals Value Taken Time  BP 127/66 10/25/20 1900  Temp    Pulse 96 10/25/20 1903  Resp 19 10/25/20 1903  SpO2 100 % 10/25/20 1903  Vitals shown include unvalidated device data.  Last Pain:  Vitals:   10/25/20 1611  TempSrc: Oral  PainSc: 0-No pain      Patients Stated Pain Goal: 2 (34/03/70 9643)  Complications: No complications documented.

## 2020-10-25 NOTE — Interval H&P Note (Signed)
History and Physical Interval Note:  10/25/2020 4:54 PM  Colton Bush  has presented today for surgery, with the diagnosis of Femoral Fracture.  The various methods of treatment have been discussed with the patient and family. After consideration of risks, benefits and other options for treatment, the patient has consented to  Procedure(s): INTRAMEDULLARY (IM) NAIL FEMORAL (Right) as a surgical intervention.  The patient's history has been reviewed, patient examined, no change in status, stable for surgery.  I have reviewed the patient's chart and labs.  Questions were answered to the patient's satisfaction.     Hilton Cork Catrice Zuleta

## 2020-10-25 NOTE — Anesthesia Preprocedure Evaluation (Addendum)
Anesthesia Evaluation  Patient identified by MRN, date of birth, ID band Patient awake    Reviewed: Allergy & Precautions, NPO status , Patient's Chart, lab work & pertinent test results, reviewed documented beta blocker date and time   Airway Mallampati: I  TM Distance: >3 FB Neck ROM: Full    Dental  (+) Missing, Edentulous Upper, Dental Advisory Given, Caps,    Pulmonary neg pulmonary ROS,    Pulmonary exam normal breath sounds clear to auscultation       Cardiovascular hypertension, Pt. on medications Normal cardiovascular exam Rhythm:Regular Rate:Normal  Echo 10/22/20 Essentially normal with Grade 1 diastolic dysfunction   Neuro/Psych PSYCHIATRIC DISORDERS Depression    GI/Hepatic hiatal hernia, GERD  Controlled and Medicated,Adenocarcinoma head of pancrea   Endo/Other  diabetes, Poorly Controlled, Type 2, Insulin DependentHyperlipidemia  Renal/GU   negative genitourinary   Musculoskeletal  (+) Arthritis , Osteoarthritis,  Right femoral neck Fx   Abdominal   Peds  Hematology SIRS from urosepsis currently being treated   Anesthesia Other Findings   Reproductive/Obstetrics                            Anesthesia Physical Anesthesia Plan  ASA: III  Anesthesia Plan: General   Post-op Pain Management:    Induction: Intravenous  PONV Risk Score and Plan: 3 and Treatment may vary due to age or medical condition and Ondansetron  Airway Management Planned: Oral ETT  Additional Equipment:   Intra-op Plan:   Post-operative Plan: Extubation in OR  Informed Consent: I have reviewed the patients History and Physical, chart, labs and discussed the procedure including the risks, benefits and alternatives for the proposed anesthesia with the patient or authorized representative who has indicated his/her understanding and acceptance.     Dental advisory given  Plan Discussed with: CRNA and  Anesthesiologist  Anesthesia Plan Comments:         Anesthesia Quick Evaluation

## 2020-10-25 NOTE — Plan of Care (Signed)
Plan of care discussed with pt.

## 2020-10-25 NOTE — Progress Notes (Signed)
GI Location of Tumor / Histology: Adenocarcinoma of head of pancreas  HISTORY OF PRESENT ILLNESS: Colton Bush is a 65 y.o. male seen at the request of Dr. Ardis Hughs. The patient was seen in the ED and admitted on 02/27/16 with a three month history of 40 pound weight loss, poor appetite, jaundice, and fatigue. He underwent CT imaging which revealed an ill defined mass in the head of the pancreas, with pancreatic duct dilatation to 1.2 cm, and common bile duct distention to 1.5 cm. A 2 cm short axis lymph node in the portacaval space was noted. His T bili was 11, and CA 19-9 was 1497 on 02/28/16. On 03/01/16, an ERCP was performed with stent placement in the common bile duct. Brushings were obtained and revealed atypical cells His bilirubin trended down, and he was discharged on 03/02/16. Subsequent endoscopic ultrasound on 03/09/16  Revealed a 35 mm hypoechoic and mixed solid/cystic mass in the pancreatic head. There is suspicion of involvement of the portal vein, but not of the celiac trunk, SMA, or SMV. One peripancreatic node measuring 1.2 cm was noted above the mass, and his stent was in good position. A FNA of the mass revealed adenocarcinoma consistent with pancreatic primary.  During [facility] conference, it was recommended that he consider neoadjuvant chemotherapy, SBRT following chemotherapy, and subsequent surgical resection. He received 5 1/2 weeks of chemoRT.   Since his last visit in 2018, he completed additional Xeloda that have been given at the time of his radiotherapy.  He then went on to have diagnostic laparoscopy on 09/19/2016 and unfortunately had vascular involvement as well as concerns for 2 liver lesions so his procedure was aborted.  He received gemcitabine and Abraxane but only received 1 cycle as he was also contemplating surgery at [facility], on 11/17/2016 he underwent pancreatectomy with proximal subtotal in total duodenectomy, partial gastrectomy, and Whipple type procedure.  Final pathology revealed a  grade 2 adenocarcinoma of the pancreatic head measuring up to 27 mm with perineural invasion, 20 lymph nodes were removed and none contained metastatic disease.  He went on to continue with single agent gemcitabine which was completed in August 2018.  He continued to do well until imaging showed concerns for possible soft tissue recurrence along the porta hepatis based on PET imaging in September 2021.  This was also consistent with an upward trend in his CA 19-9 since the fall 2021.  Endoscopic ultrasound on 05/27/2020 was performed by Dr. Ardis Hughs but it was very difficult and the region of the porta hepatis was not visualized due to surgically altered anatomy, this was reattempted at [facility] on 06/04/2020 but again quite limited and a fine-needle aspirate was performed of a 17 mm lesion along the common hepatic artery.  Final pathology showed scant blood and hypocellular soft tissue fragments without carcinoma present.  A second attempt at [facility] with IR and transcaval biopsy of the soft tissue around the celiac artery was performed on 09/24/2020. Pathology showed scant glandular epithelium suspicious for metastatic adenocarcinoma and immunostains were consistent with pancreatobiliary origin.  No additional options of surgical resection are available, and given given the local recurrence and good quality of life and performance status the patient was interested in hearing more about reirradiation.   SAFETY ISSUES:  Prior radiation? Pancreas treated to 45 Gy in 25 fractions, boosted to 54 Gy in 5 fractions 06/28/2016-08/10/2016.  Pacemaker/ICD? No  Possible current pregnancy? n/a  Is the patient on methotrexate? no  Current Complaints/Details:

## 2020-10-25 NOTE — Progress Notes (Signed)
Surgery cancelled yesterday due to OR power outage per hospital policy. Plan for surgery today at 1700. NPO.

## 2020-10-25 NOTE — Anesthesia Procedure Notes (Signed)
Procedure Name: Intubation Date/Time: 10/25/2020 5:38 PM Performed by: Cynda Familia, CRNA Pre-anesthesia Checklist: Patient identified, Emergency Drugs available, Suction available and Patient being monitored Patient Re-evaluated:Patient Re-evaluated prior to induction Oxygen Delivery Method: Circle System Utilized Preoxygenation: Pre-oxygenation with 100% oxygen Induction Type: IV induction, Rapid sequence and Cricoid Pressure applied Ventilation: Mask ventilation without difficulty Laryngoscope Size: Miller and 2 Tube type: Oral Number of attempts: 1 Airway Equipment and Method: Stylet Placement Confirmation: ETT inserted through vocal cords under direct vision,  positive ETCO2 and breath sounds checked- equal and bilateral Secured at: 22 cm Tube secured with: Tape Dental Injury: Teeth and Oropharynx as per pre-operative assessment  Comments: Smooth RSI by Royce Macadamia-- intubation AM CRNA atraumatic-- poor dentition-- many missing teeth- none on upper-- implants lower- all intact as preop-- bilat BS Royce Macadamia

## 2020-10-25 NOTE — Care Management Important Message (Signed)
Important Message  Patient Details  IM Letter given to the Patient. Name: Colton Bush MRN: [id number] Date of Birth: 11/07/1955   Medicare Important Message Given:  Yes     Kerin Salen 10/25/2020, 12:55 PM

## 2020-10-25 NOTE — Progress Notes (Signed)
PROGRESS NOTE    Colton Bush  ZOX:[id number] DOB: 10/23/1955 DOA: 10/18/2020 PCP: [name], MD   Chief Complaint  Patient presents with  . Fall   Brief Narrative:  Colton Bush is Colton Bush 65 y.o. male with Colton Bush history of recurrent pancreatic cancer, diabetes mellitus, hypertension, hyperlipidemia. Patient presented secondary to Colton Bush fall and subsequent right hip fracture. Plan for surgery.  Assessment & Plan:   Principal Problem:   Closed right hip fracture, initial encounter [facility]) Active Problems:   Essential hypertension   Type 2 diabetes mellitus with other specified complication ([place])   Adenocarcinoma of head of pancreas ([place])   Fall from ground level  Strep Anginosis Bacteremia  E. Coli Urinary Tract Infection   SIRS Fever 3/22 PM as well as worsened leukocytosis 3/23 -> seems out of proportion to what would be expected with hip fx due to reactive leukocytosis or simple UTI.   Blood cx with 1/4 with strep anginosis - unclear significance Repeat blood cx 3/25 AM NGTD x3 days E. Coli UTI, pan sensitive Continue ceftriaxone, defer abx to ID -> narrowed to amoxicillin to complete course (covers UTI and strep bacteremia) Will consult ID, appreciate assistance -> recommending TTE (EF 81-19%, grade 1 diastolic dysfunction - without evidence of valvular vegetations - see report).  Need repeat cx NGTD x 48 hrs prior to surgery.  ok for surgery 3/27, cx NGTD x 3 days.   Right hip fracture Right comminuted/angulated intertrochanteric fracture. Orthopedic surgery consulted.  Surgery likely today, 3/28 (delayed yesterday due to power outage unfortunately) -Orthopedic surgery recommendations: Surgery -Continue oxycodone and morphine prn  Fall Resulting in above. No syncope.  Diabetes mellitus, type 2 Patient is on Levemir and Novolog sliding scale as an outpatient. Conflicting diagnoses on chart review between type 1 and type 2. Started on Levemir 15 units BID. - continue levemir  BID here - hyperglycemic today, adjust as needed -Continue Levemir and SSI  Pancreatic cancer Recurrence. Patient follows with Dr. [name] as an outpatient. Plan for radiation therapy as an outpatient. Patient plans to follow up with Dr. [name], radiation oncology. -Continue Creon -added Dr. [name] to treatment team - see 3/24 note  Primary hypertension -Continue amlodipine  Hyperlipidemia -Continue Crestor  GERD -Continue Protonix  DVT prophylaxis: SCD - post op anticoagulation per ortho  Code Status: full  Family Communication: none at bedside Disposition:   Status is: Inpatient  Remains inpatient appropriate because:Inpatient level of care appropriate due to severity of illness   Dispo: The patient is from: Home              Anticipated d/c is to: pending              Patient currently is not medically stable to d/c.   Difficult to place patient No   Consultants:   ortho  Procedures: Echo 1. Left ventricular ejection fraction, by estimation, is 55 to 60%. The  left ventricle has normal function. The left ventricle has no regional  wall motion abnormalities. There is mild concentric left ventricular  hypertrophy. Left ventricular diastolic  parameters are consistent with Grade I diastolic dysfunction (impaired  relaxation).  2. Right ventricular systolic function is normal. The right ventricular  size is normal.  3. The mitral valve is normal in structure. No evidence of mitral valve  regurgitation. No evidence of mitral stenosis.  4. The aortic valve is normal in structure. Aortic valve regurgitation is  not visualized. No aortic stenosis is present.  5. The inferior vena cava is normal in size with greater than 50%  respiratory variability, suggesting right atrial pressure of 3 mmHg.   Antimicrobials:  Anti-infectives (From admission, onward)   Start     Dose/Rate Route Frequency Ordered Stop   10/25/20 0900  amoxicillin (AMOXIL) capsule 1,000 mg         1,000 mg Oral Every 8 hours 10/25/20 0801 10/27/20 0559   10/25/20 0600  amoxicillin (AMOXIL) capsule 1,000 mg  Status:  Discontinued        1,000 mg Oral Every 8 hours 10/24/20 1355 10/25/20 0801   10/24/20 1059  ceFAZolin (ANCEF) 2-4 GM/100ML-% IVPB  Status:  Discontinued       Note to Pharmacy: [name]   : cabinet override      10/24/20 1059 10/24/20 1109   10/22/20 1000  cefTRIAXone (ROCEPHIN) 2 g in sodium chloride 0.9 % 100 mL IVPB  Status:  Discontinued        2 g 200 mL/hr over 30 Minutes Intravenous Every 24 hours 10/21/20 1343 10/24/20 1354   10/21/20 1400  cefTRIAXone (ROCEPHIN) 1 g in sodium chloride 0.9 % 100 mL IVPB        1 g 200 mL/hr over 30 Minutes Intravenous  Once 10/21/20 1343 10/21/20 1624   10/21/20 1330  ceFAZolin (ANCEF) IVPB 2g/100 mL premix  Status:  Discontinued        2 g 200 mL/hr over 30 Minutes Intravenous On call to O.R. 10/21/20 1323 10/21/20 1350   10/21/20 1330  ceFAZolin (ANCEF) IVPB 2g/100 mL premix  Status:  Discontinued        2 g 200 mL/hr over 30 Minutes Intravenous  Once 10/21/20 1324 10/21/20 1325   10/21/20 1300  ceFAZolin (ANCEF) 2-4 GM/100ML-% IVPB       Note to Pharmacy: [name]   : cabinet override      10/21/20 1300 10/22/20 0114   10/20/20 1230  cefTRIAXone (ROCEPHIN) 1 g in sodium chloride 0.9 % 100 mL IVPB  Status:  Discontinued        1 g 200 mL/hr over 30 Minutes Intravenous Every 24 hours 10/20/20 1141 10/21/20 1354   10/20/20 0600  ceFAZolin (ANCEF) IVPB 2g/100 mL premix        2 g 200 mL/hr over 30 Minutes Intravenous On call to O.R. 10/19/20 2233 10/21/20 0559     Subjective: Frustrated with wait and delays  Objective: Vitals:   10/24/20 1131 10/24/20 1354 10/24/20 2028 10/25/20 0513  BP: 120/60 128/61 140/63 118/65  Pulse: 75 81 79 82  Resp: 16 17 16 16   Temp: 99.1 F (37.3 C) 98.8 F (37.1 C) 98.9 F (37.2 C) 98.7 F (37.1 C)  TempSrc: Oral Oral Oral Oral  SpO2: 94% 96% 94% 93%  Weight:       Height:        Intake/Output Summary (Last 24 hours) at 10/25/2020 0850 Last data filed at 10/25/2020 0349 Gross per 24 hour  Intake 1546 ml  Output 2175 ml  Net -629 ml   Filed Weights   10/18/20 2330 10/21/20 1326  Weight: 88.9 kg 88.9 kg    Examination:  General: No acute distress. Cardiovascular: Heart sounds show Araseli Sherry regular rate, and rhythm. Lungs: Clear to auscultation bilaterally Abdomen: Soft, nontender, nondistended Neurological: Alert and oriented 3. Moves all extremities 4. Cranial nerves II through XII grossly intact. Skin: Warm and dry. No rashes or lesions. Extremities: RLE shortened and externally rotated  Data Reviewed: I have personally reviewed following labs and imaging studies  CBC: Recent Labs  Lab 10/21/20 0305 10/22/20 0307 10/23/20 0321 10/24/20 0251 10/25/20 0316  WBC 16.1* 13.6* 12.5* 12.7* 14.7*  NEUTROABS 13.4* 10.9* 9.5* 10.2* 12.0*  HGB 10.0* 10.3* 10.4* 10.7* 10.3*  HCT 30.1* 31.5* 32.0* 32.4* 31.3*  MCV 90.1 90.0 91.7 90.5 92.3  PLT 406* 446* 511* 554* 536*    Basic Metabolic Panel: Recent Labs  Lab 10/21/20 0305 10/22/20 0307 10/23/20 0321 10/24/20 0251 10/25/20 0316  NA 133* 133* 134* 134* 132*  K 3.4* 3.6 3.6 4.3 4.2  CL 102 103 103 103 102  CO2 24 23 23 24 24   GLUCOSE 217* 189* 195* 336* 312*  BUN 19 15 18 22 20   CREATININE 0.78 0.76 0.66 0.72 0.62  CALCIUM 7.5* 7.7* 7.9* 7.9* 8.0*  MG 2.2 2.0 2.2 2.1 2.0  PHOS 2.6 2.7 2.8 2.8 3.1    GFR: Estimated Creatinine Clearance: 100.6 mL/min (by C-G formula based on SCr of 0.62 mg/dL).  Liver Function Tests: Recent Labs  Lab 10/21/20 0305 10/22/20 0307 10/23/20 0321 10/24/20 0251 10/25/20 0316  AST 31 22 16 18  12*  ALT 25 25 21 21 18   ALKPHOS 222* 210* 198* 255* 218*  BILITOT 0.7 0.8 0.8 0.6 0.5  PROT 6.5 7.0 7.0 7.0 6.9  ALBUMIN 2.1* 2.2* 2.2* 2.1* 2.1*    CBG: Recent Labs  Lab 10/24/20 0752 10/24/20 1133 10/24/20 1631 10/24/20 2025  10/25/20 0750  GLUCAP 308* 206* 388* 284* 273*     Recent Results (from the past 240 hour(s))  Resp Panel by RT-PCR (Flu Arletha Marschke&B, Covid) Nasopharyngeal Swab     Status: None   Collection Time: 10/18/20  7:11 PM   Specimen: Nasopharyngeal Swab; Nasopharyngeal(NP) swabs in vial transport medium  Result Value Ref Range Status   SARS Coronavirus 2 by RT PCR NEGATIVE NEGATIVE Final    Comment: (NOTE) SARS-CoV-2 target nucleic acids are NOT DETECTED.  The SARS-CoV-2 RNA is generally detectable in upper respiratory specimens during the acute phase of infection. The lowest concentration of SARS-CoV-2 viral copies this assay can detect is 138 copies/mL. Acey Woodfield negative result does not preclude SARS-Cov-2 infection and should not be used as the sole basis for treatment or other patient management decisions. Khizar Fiorella negative result may occur with  improper specimen collection/handling, submission of specimen other than nasopharyngeal swab, presence of viral mutation(s) within the areas targeted by this assay, and inadequate number of viral copies(<138 copies/mL). Edie Vallandingham negative result must be combined with clinical observations, patient history, and epidemiological information. The expected result is Negative.  Fact Sheet for Patients:  EntrepreneurPulse.com.au  Fact Sheet for Healthcare Providers:  IncredibleEmployment.be  This test is no t yet approved or cleared by the [place] FDA and  has been authorized for detection and/or diagnosis of SARS-CoV-2 by FDA under an Emergency Use Authorization (EUA). This EUA will remain  in effect (meaning this test can be used) for the duration of the COVID-19 declaration under Section 564(b)(1) of the Act, 21 U.S.C.section 360bbb-3(b)(1), unless the authorization is terminated  or revoked sooner.       Influenza Masey Scheiber by PCR NEGATIVE NEGATIVE Final   Influenza B by PCR NEGATIVE NEGATIVE Final    Comment: (NOTE) The Xpert  Xpress SARS-CoV-2/FLU/RSV plus assay is intended as an aid in the diagnosis of influenza from Nasopharyngeal swab specimens and should not be used as Susano Cleckler sole basis for treatment. Nasal washings and aspirates are unacceptable for  Xpert Xpress SARS-CoV-2/FLU/RSV testing.  Fact Sheet for Patients: EntrepreneurPulse.com.au  Fact Sheet for Healthcare Providers: IncredibleEmployment.be  This test is not yet approved or cleared by the [place] FDA and has been authorized for detection and/or diagnosis of SARS-CoV-2 by FDA under an Emergency Use Authorization (EUA). This EUA will remain in effect (meaning this test can be used) for the duration of the COVID-19 declaration under Section 564(b)(1) of the Act, 21 U.S.C. section 360bbb-3(b)(1), unless the authorization is terminated or revoked.  Performed at [facility], [place] [address] [place], [place] [address]   Surgical pcr screen     Status: Abnormal   Collection Time: 10/19/20 12:47 AM   Specimen: Nasal Mucosa; Nasal Swab  Result Value Ref Range Status   MRSA, PCR NEGATIVE NEGATIVE Final   Staphylococcus aureus POSITIVE (Karalyn Kadel) NEGATIVE Final    Comment: RESULT CALLED TO, READ BACK BY AND VERIFIED WITH: BOBBY, RN @ [phone] ON 10/19/20 C VARNER (NOTE) The Xpert SA Assay (FDA approved for NASAL specimens in patients 64 years of age and older), is one component of Chrys Landgrebe comprehensive surveillance program. It is not intended to diagnose infection nor to guide or monitor treatment. Performed at [facility], [place] [address] [place], [place] [address]   Culture, blood (routine x 2)     Status: None   Collection Time: 10/20/20  7:55 AM   Specimen: BLOOD  Result Value Ref Range Status   Specimen Description   Final    BLOOD RIGHT ANTECUBITAL Performed at [place] [address], [place], [place] [address]    Special Requests   Final    BOTTLES  DRAWN AEROBIC AND ANAEROBIC Blood Culture results may not be optimal due to an inadequate volume of blood received in culture bottles Performed at [place] [address], [place], [place] [address]    Culture   Final    NO GROWTH 5 DAYS Performed at [facility], [place] [address], [place], [place] [address]    Report Status 10/25/2020 FINAL  Final  Culture, blood (routine x 2)     Status: Abnormal   Collection Time: 10/20/20  7:56 AM   Specimen: BLOOD  Result Value Ref Range Status   Specimen Description   Final    BLOOD RIGHT ANTECUBITAL Performed at [place] [address] [place], [place] [address]    Special Requests   Final    BOTTLES DRAWN AEROBIC AND ANAEROBIC Blood Culture results may not be optimal due to an inadequate volume of blood received in culture bottles Performed at [place] [address] [place], [place] [address]    Culture  Setup Time   Final    AEROBIC BOTTLE ONLY GRAM POSITIVE COCCI CRITICAL RESULT CALLED TO, READ BACK BY AND VERIFIED WITHMelodye Ped [facility] 2025 10/21/20 Andrew Blasius BROWNING Performed at [facility], [place] [address] [place], [place] [address]    Culture STREPTOCOCCUS ANGINOSIS (Rever Pichette)  Final   Report Status 10/24/2020 FINAL  Final   Organism ID, Bacteria STREPTOCOCCUS ANGINOSIS  Final      Susceptibility   Streptococcus anginosis - MIC*    PENICILLIN <=0.06 SENSITIVE Sensitive     CEFTRIAXONE 0.5 SENSITIVE Sensitive     ERYTHROMYCIN <=0.12 SENSITIVE Sensitive     LEVOFLOXACIN 0.5 SENSITIVE Sensitive     VANCOMYCIN 1 SENSITIVE Sensitive     * STREPTOCOCCUS ANGINOSIS  Blood Culture ID Panel (  Reflexed)     Status: Abnormal   Collection Time: 10/20/20  7:56 AM  Result Value Ref Range Status   Enterococcus faecalis NOT DETECTED NOT DETECTED Final   Enterococcus Faecium NOT DETECTED NOT DETECTED Final   Listeria monocytogenes NOT DETECTED NOT DETECTED Final    Staphylococcus species NOT DETECTED NOT DETECTED Final   Staphylococcus aureus (BCID) NOT DETECTED NOT DETECTED Final   Staphylococcus epidermidis NOT DETECTED NOT DETECTED Final   Staphylococcus lugdunensis NOT DETECTED NOT DETECTED Final   Streptococcus species DETECTED (Amye Grego) NOT DETECTED Final    Comment: Not Enterococcus species, Streptococcus agalactiae, Streptococcus pyogenes, or Streptococcus pneumoniae. CRITICAL RESULT CALLED TO, READ BACK BY AND VERIFIED WITH: Melodye Ped PHARMD 1316 10/21/20 Yovany Clock BROWNING    Streptococcus agalactiae NOT DETECTED NOT DETECTED Final   Streptococcus pneumoniae NOT DETECTED NOT DETECTED Final   Streptococcus pyogenes NOT DETECTED NOT DETECTED Final   Koreena Joost.calcoaceticus-baumannii NOT DETECTED NOT DETECTED Final   Bacteroides fragilis NOT DETECTED NOT DETECTED Final   Enterobacterales NOT DETECTED NOT DETECTED Final   Enterobacter cloacae complex NOT DETECTED NOT DETECTED Final   Escherichia coli NOT DETECTED NOT DETECTED Final   Klebsiella aerogenes NOT DETECTED NOT DETECTED Final   Klebsiella oxytoca NOT DETECTED NOT DETECTED Final   Klebsiella pneumoniae NOT DETECTED NOT DETECTED Final   Proteus species NOT DETECTED NOT DETECTED Final   Salmonella species NOT DETECTED NOT DETECTED Final   Serratia marcescens NOT DETECTED NOT DETECTED Final   Haemophilus influenzae NOT DETECTED NOT DETECTED Final   Neisseria meningitidis NOT DETECTED NOT DETECTED Final   Pseudomonas aeruginosa NOT DETECTED NOT DETECTED Final   Stenotrophomonas maltophilia NOT DETECTED NOT DETECTED Final   Candida albicans NOT DETECTED NOT DETECTED Final   Candida auris NOT DETECTED NOT DETECTED Final   Candida glabrata NOT DETECTED NOT DETECTED Final   Candida krusei NOT DETECTED NOT DETECTED Final   Candida parapsilosis NOT DETECTED NOT DETECTED Final   Candida tropicalis NOT DETECTED NOT DETECTED Final   Cryptococcus neoformans/gattii NOT DETECTED NOT DETECTED Final    Comment:  Performed at [facility], [address], [place], [place] [address]  Culture, Urine     Status: Abnormal   Collection Time: 10/20/20 10:25 AM   Specimen: Urine, Clean Catch  Result Value Ref Range Status   Specimen Description   Final    URINE, CLEAN CATCH Performed at [facility], [place] [address] [place], [place] [address]    Special Requests   Final    NONE Performed at [facility], [place] [address], [place], [place] [address]    Culture >=100,000 COLONIES/mL ESCHERICHIA COLI (Chief Walkup)  Final   Report Status 10/23/2020 FINAL  Final   Organism ID, Bacteria ESCHERICHIA COLI (Tyvon Eggenberger)  Final      Susceptibility   Escherichia coli - MIC*    AMPICILLIN 4 SENSITIVE Sensitive     CEFAZOLIN <=4 SENSITIVE Sensitive     CEFEPIME <=0.12 SENSITIVE Sensitive     CEFTRIAXONE <=0.25 SENSITIVE Sensitive     CIPROFLOXACIN <=0.25 SENSITIVE Sensitive     GENTAMICIN <=1 SENSITIVE Sensitive     IMIPENEM <=0.25 SENSITIVE Sensitive     NITROFURANTOIN <=16 SENSITIVE Sensitive     TRIMETH/SULFA <=20 SENSITIVE Sensitive     AMPICILLIN/SULBACTAM <=2 SENSITIVE Sensitive     PIP/TAZO <=4 SENSITIVE Sensitive     * >=100,000 COLONIES/mL ESCHERICHIA COLI  Culture, blood (routine x 2)     Status: None (Preliminary result)  Collection Time: 10/22/20  7:26 AM   Specimen: BLOOD  Result Value Ref Range Status   Specimen Description   Final    BLOOD LEFT ANTECUBITAL Performed at [facility] [address] [place], [place] [address]    Special Requests   Final    BOTTLES DRAWN AEROBIC ONLY Blood Culture results may not be optimal due to an inadequate volume of blood received in culture bottles Performed at [place] [address], [place], [place] [address]    Culture   Final    NO GROWTH 3 DAYS Performed at [facility], [place] [address], [place], [place] [address]    Report Status PENDING  Incomplete  Culture, blood  (routine x 2)     Status: None (Preliminary result)   Collection Time: 10/22/20  7:26 AM   Specimen: BLOOD  Result Value Ref Range Status   Specimen Description   Final    BLOOD LEFT ANTECUBITAL Performed at [place] [address], [place], [place] [address]    Special Requests   Final    BOTTLES DRAWN AEROBIC ONLY Blood Culture results may not be optimal due to an inadequate volume of blood received in culture bottles Performed at [place] [address] [place], [place] [address]    Culture   Final    NO GROWTH 3 DAYS Performed at [facility], [place] [address] [place], [place] [address]    Report Status PENDING  Incomplete         Radiology Studies: No results found.      Scheduled Meds: . amLODipine  10 mg Oral Daily  . amoxicillin  1,000 mg Oral Q8H  . Chlorhexidine Gluconate Cloth  6 each Topical Daily  . feeding supplement (GLUCERNA SHAKE)  237 mL Oral TID BM  . insulin aspart  0-15 Units Subcutaneous TID WC  . insulin aspart  0-5 Units Subcutaneous QHS  . insulin aspart  3 Units Subcutaneous TID WC  . insulin detemir  15 Units Subcutaneous QHS  . insulin detemir  7 Units Subcutaneous Daily  . lipase/protease/amylase  24,000 Units Oral TID with meals  . pantoprazole  40 mg Oral Daily  . polyethylene glycol  17 g Oral BID  . potassium chloride SA  20 mEq Oral Daily  . rosuvastatin  20 mg Oral Daily  . traZODone  50 mg Oral QHS   Continuous Infusions: . methocarbamol (ROBAXIN) IV 500 mg (10/20/20 2319)     LOS: 7 days    Time spent: over 30 min    Fayrene Helper, MD [facility]   To contact the attending provider between 7A-7P or the covering provider during after hours 7P-7A, please log into the web site [email or web address] and access using universal [place] password for that web site. If you do not have the password, please call the hospital operator.  10/25/2020, 8:50 AM

## 2020-10-25 NOTE — Op Note (Signed)
OPERATIVE REPORT  SURGEON: Rod Can, MD   ASSISTANT: Staff.  PREOPERATIVE DIAGNOSIS: Right intertrochanteric femur fracture.   POSTOPERATIVE DIAGNOSIS: Right intertrochanteric femur fracture.   PROCEDURE: Intramedullary fixation, Right femur.   IMPLANTS: Biomet Affixus Hip Fracture Nail, 11 by 400 mm, 125 degrees. 10.5 x 110 mm Hip Fracture Nail Lag Screw. 5 x 46 mm distal interlocking screw 1.  ANESTHESIA:  General  ESTIMATED BLOOD LOSS:-650 mL    ANTIBIOTICS:  2 g Ancef.  DRAINS: None.  COMPLICATIONS: None.   CONDITION: PACU - hemodynamically stable.   BRIEF CLINICAL NOTE: Colton Bush is a 65 y.o. male who presented with an intertrochanteric femur fracture. He has a history of recurrent pancreatic cancer without bony metastasis. The patient was admitted to the hospitalist service and underwent perioperative risk stratification and medical optimization. The risks, benefits, and alternatives to the procedure were explained, and the patient elected to proceed.  PROCEDURE IN DETAIL: Surgical site was marked by myself. The patient was taken to the operating room and anesthesia was induced on the bed. The patient was then transferred to the [facility] table and the nonoperative lower extremity was scissored underneath the operative side. The fracture was reduced with traction, internal rotation, and adduction. The hip was prepped and draped in the normal sterile surgical fashion. Timeout was called verifying side and site of surgery. Preop antibiotics were given with 60 minutes of beginning the procedure.  Fluoroscopy was used to define the patient's anatomy. A 4 cm incision was made just proximal to the tip of the greater trochanter. The awl was used to obtain the standard starting point for a trochanteric entry nail under fluoroscopic control. The guidepin was placed. The entry reamer was used to open the proximal femur.  I placed the guidewire to the level of the physeal scar of  the knee. I measured the length of the guidewire. Sequential reaming was performed up to a size 12.5 mm with excellent chatter. Therefore, a size 11 by 400 mm nail was selected and assembled to the jig on the back table. The nail was placed without any difficulty. Through a separate stab incision, the cannula was placed down to the bone in preparation for the cephalomedullary device. A guidepin was placed into the femoral head using AP and lateral fluoroscopy views. The pin was measured, and then reaming was performed to the appropriate depth. The lag screw was inserted to the appropriate depth. The fracture was compressed through the jig. The setscrew was tightened and then loosened one quarter turn. Using perfect circle technique, a distal interlocking screw was placed. The jig was removed. Final AP and lateral fluoroscopy views were obtained to confirm fracture reduction and hardware placement. Tip apex distance was appropriate. There was no chondral penetration.  The wounds were copiously irrigated with saline. The wound was closed in layers with #1 Vicryl for the fascia, 2-0 Monocryl for the deep dermal layer, and skin staples. Glue was applied to the skin. Once the glue was fully hardened, sterile dressing was applied. The patient was then awakened from anesthesia and taken to the PACU in stable condition. Sponge needle and instrument counts were correct at the end of the case 2. There were no known complications.  We will readmit the patient to the hospitalist. Weightbearing status will be weightbearing as toelrated with a walker. We will begin Lovenox for DVT prophylaxis. The patient will work with physical therapy and undergo disposition planning.

## 2020-10-25 NOTE — Progress Notes (Signed)
Inpatient Diabetes Program Recommendations  AACE/ADA: New Consensus Statement on Inpatient Glycemic Control (2015)  Target Ranges:  Prepandial:   less than 140 mg/dL      Peak postprandial:   less than 180 mg/dL (1-2 hours)      Critically ill patients:  140 - 180 mg/dL   Lab Results  Component Value Date   GLUCAP 273 (H) 10/25/2020   HGBA1C 10.8 (H) 10/18/2020    Review of Glycemic Control Results for [place], NO "[name]" (MRN [id number]) as of 10/25/2020 08:29  Ref. Range 10/23/2020 16:44 10/23/2020 20:43 10/24/2020 07:52 10/24/2020 11:33 10/24/2020 16:31 10/24/2020 20:25 10/25/2020 07:50  Glucose-Capillary Latest Ref Range: 70 - 99 mg/dL 264 (H) 308 (H) 308 (H) 206 (H) 388 (H) 284 (H) 273 (H)   Diabetes history: DM Outpatient Diabetes medications: Levemir 35 units in the AM and 25 units q PM, Novolog 3-12 units tid with meals Current orders for Inpatient glycemic control:  Novolog moderate tid with meals and HS Novolog 3 units tid with meals Levemir 7 units q AM and Levemir 15 units q HS Inpatient Diabetes Program Recommendations:   Note patient is NPO for procedure. Blood sugars>goal. Consider increasing Levemir 15 units bid.  Also consider changing Novolog correction to q 4 hours.   Thanks,  Adah Perl, RN, BC-ADM Inpatient Diabetes Coordinator Pager [phone] (8a-5p)

## 2020-10-25 NOTE — Discharge Instructions (Signed)
 Dr. Brandy Zuba Adult Hip & Knee Specialist [facility] [address] [place], [place] [address] [phone]   POSTOPERATIVE DIRECTIONS    Hip Rehabilitation, Guidelines Following Surgery   WEIGHT BEARING Weight bearing as tolerated with assist device (walker, cane, etc) as directed, use it as long as suggested by your surgeon or therapist, typically at least 4-6 weeks.   HOME CARE INSTRUCTIONS  Remove items at home which could result in a fall. This includes throw rugs or furniture in walking pathways.  Continue medications as instructed at time of discharge.  You may have some home medications which will be placed on hold until you complete the course of blood thinner medication.  4 days after discharge, you may start showering. No tub baths or soaking your incisions. Do not put on socks or shoes without following the instructions of your caregivers.   Sit on chairs with arms. Use the chair arms to help push yourself up when arising.  Arrange for the use of a toilet seat elevator so you are not sitting low.   Walk with walker as instructed.  You may resume a sexual relationship in one month or when given the OK by your caregiver.  Use walker as long as suggested by your caregivers.  Avoid periods of inactivity such as sitting longer than an hour when not asleep. This helps prevent blood clots.  You may return to work once you are cleared by your surgeon.  Do not drive a car for 6 weeks or until released by your surgeon.  Do not drive while taking narcotics.  Wear elastic stockings for two weeks following surgery during the day but you may remove then at night.  Make sure you keep all of your appointments after your operation with all of your doctors and caregivers. You should call the office at the above phone number and make an appointment for approximately two weeks after the date of your surgery. Please pick up a stool softener and laxative  for home use as long as you are requiring pain medications.  ICE to the affected hip every three hours for 30 minutes at a time and then as needed for pain and swelling. Continue to use ice on the hip for pain and swelling from surgery. You may notice swelling that will progress down to the foot and ankle.  This is normal after surgery.  Elevate the leg when you are not up walking on it.   It is important for you to complete the blood thinner medication as prescribed by your doctor.  Continue to use the breathing machine which will help keep your temperature down.  It is common for your temperature to cycle up and down following surgery, especially at night when you are not up moving around and exerting yourself.  The breathing machine keeps your lungs expanded and your temperature down.  RANGE OF MOTION AND STRENGTHENING EXERCISES  These exercises are designed to help you keep full movement of your hip joint. Follow your caregiver's or physical therapist's instructions. Perform all exercises about fifteen times, three times per day or as directed. Exercise both hips, even if you have had only one joint replacement. These exercises can be done on a training (exercise) mat, on the floor, on a table or on a bed. Use whatever works the best and is most comfortable for you. Use music or television while you are exercising so that the exercises are a pleasant break in your day. This   will make your life better with the exercises acting as a break in routine you can look forward to.  Lying on your back, slowly slide your foot toward your buttocks, raising your knee up off the floor. Then slowly slide your foot back down until your leg is straight again.  Lying on your back spread your legs as far apart as you can without causing discomfort.  Lying on your side, raise your upper leg and foot straight up from the floor as far as is comfortable. Slowly lower the leg and repeat.  Lying on your back, tighten up the  muscle in the front of your thigh (quadriceps muscles). You can do this by keeping your leg straight and trying to raise your heel off the floor. This helps strengthen the largest muscle supporting your knee.  Lying on your back, tighten up the muscles of your buttocks both with the legs straight and with the knee bent at a comfortable angle while keeping your heel on the floor.   SKILLED REHAB INSTRUCTIONS: If the patient is transferred to a skilled rehab facility following release from the hospital, a list of the current medications will be sent to the facility for the patient to continue.  When discharged from the skilled rehab facility, please have the facility set up the patient's Home Health Physical Therapy prior to being released. Also, the skilled facility will be responsible for providing the patient with their medications at time of release from the facility to include their pain medication and their blood thinner medication. If the patient is still at the rehab facility at time of the two week follow up appointment, the skilled rehab facility will also need to assist the patient in arranging follow up appointment in our office and any transportation needs.  MAKE SURE YOU:  Understand these instructions.  Will watch your condition.  Will get help right away if you are not doing well or get worse.  Pick up stool softner and laxative for home use following surgery while on pain medications. Daily dry dressing changes as needed. In 4 days, you may remove your dressings and begin taking showers - no tub baths or soaking the incisions. Continue to use ice for pain and swelling after surgery. Do not use any lotions or creams on the incision until instructed by your surgeon.   

## 2020-10-25 NOTE — Anesthesia Postprocedure Evaluation (Signed)
Anesthesia Post Note  Patient: Colton Bush  Procedure(s) Performed: INTRAMEDULLARY (IM) NAIL FEMORAL (Right )     Patient location during evaluation: PACU Anesthesia Type: General Level of consciousness: awake and alert and oriented Pain management: pain level controlled Vital Signs Assessment: post-procedure vital signs reviewed and stable Respiratory status: spontaneous breathing, nonlabored ventilation and respiratory function stable Cardiovascular status: blood pressure returned to baseline and stable Postop Assessment: no apparent nausea or vomiting Anesthetic complications: no   No complications documented.  Last Vitals:  Vitals:   10/25/20 2015 10/25/20 2029  BP: 112/63 117/65  Pulse: 94 95  Resp: 13 12  Temp:  36.7 C  SpO2: 94% 94%    Last Pain:  Vitals:   10/25/20 2029  TempSrc:   PainSc: 2         RLE Motor Response: Purposeful movement (10/25/20 2029)        Lela Gell,Demarco A.

## 2020-10-26 ENCOUNTER — Ambulatory Visit
Admit: 2020-10-26 | Discharge: 2020-10-26 | Disposition: A | Discharge status: Home / Self Care | Payer: Medicare Other | Attending: Radiation Oncology | Admitting: Radiation Oncology

## 2020-10-26 ENCOUNTER — Encounter (HOSPITAL_COMMUNITY): Payer: Self-pay | Admitting: Orthopedic Surgery

## 2020-10-26 DIAGNOSIS — S72001A Fracture of unspecified part of neck of right femur, initial encounter for closed fracture: Secondary | ICD-10-CM | POA: Diagnosis not present

## 2020-10-26 DIAGNOSIS — C250 Malignant neoplasm of head of pancreas: Secondary | ICD-10-CM

## 2020-10-26 LAB — GLUCOSE, CAPILLARY
Glucose-Capillary: 292 mg/dL — ABNORMAL HIGH (ref 70–99)
Glucose-Capillary: 355 mg/dL — ABNORMAL HIGH (ref 70–99)
Glucose-Capillary: 343 mg/dL — ABNORMAL HIGH (ref 70–99)
Glucose-Capillary: 282 mg/dL — ABNORMAL HIGH (ref 70–99)

## 2020-10-26 LAB — COMPREHENSIVE METABOLIC PANEL
ALT: 21 U/L (ref 0–44)
AST: 17 U/L (ref 15–41)
Albumin: 2.3 g/dL — ABNORMAL LOW (ref 3.5–5.0)
Alkaline Phosphatase: 211 U/L — ABNORMAL HIGH (ref 38–126)
Anion gap: 7 (ref 5–15)
BUN: 24 mg/dL — ABNORMAL HIGH (ref 8–23)
CO2: 24 mmol/L (ref 22–32)
Calcium: 8.3 mg/dL — ABNORMAL LOW (ref 8.9–10.3)
Chloride: 101 mmol/L (ref 98–111)
Creatinine, Ser: 0.64 mg/dL (ref 0.61–1.24)
GFR, Estimated: >60 mL/min (ref >60)
Glucose, Bld: 347 mg/dL — ABNORMAL HIGH (ref 70–99)
Potassium: 4.7 mmol/L (ref 3.5–5.1)
Sodium: 132 mmol/L — ABNORMAL LOW (ref 135–145)
Total Bilirubin: 0.8 mg/dL (ref 0.3–1.2)
Total Protein: 7.2 g/dL (ref 6.5–8.1)

## 2020-10-26 LAB — PHOSPHORUS: Phosphorus: 4.1 mg/dL (ref 2.5–4.6)

## 2020-10-26 LAB — CBC WITH DIFFERENTIAL/PLATELET
Abs Immature Granulocytes: 0.15 10*3/uL — ABNORMAL HIGH (ref 0.00–0.07)
Basophils Absolute: 0 10*3/uL (ref 0.0–0.1)
Basophils Relative: 0 %
Eosinophils Absolute: 0 10*3/uL (ref 0.0–0.5)
Eosinophils Relative: 0 %
HCT: 29.4 % — ABNORMAL LOW (ref 39.0–52.0)
Hemoglobin: 9.6 g/dL — ABNORMAL LOW (ref 13.0–17.0)
Immature Granulocytes: 1 %
Lymphocytes Relative: 3 %
Lymphs Abs: 0.7 10*3/uL (ref 0.7–4.0)
MCH: 30 pg (ref 26.0–34.0)
MCHC: 32.7 g/dL (ref 30.0–36.0)
MCV: 91.9 fL (ref 80.0–100.0)
Monocytes Absolute: 0.2 10*3/uL (ref 0.1–1.0)
Monocytes Relative: 1 %
Neutro Abs: 18.2 10*3/uL — ABNORMAL HIGH (ref 1.7–7.7)
Neutrophils Relative %: 95 %
Platelets: 494 10*3/uL — ABNORMAL HIGH (ref 150–400)
RBC: 3.2 MIL/uL — ABNORMAL LOW (ref 4.22–5.81)
RDW: 15.1 % (ref 11.5–15.5)
WBC: 19.2 10*3/uL — ABNORMAL HIGH (ref 4.0–10.5)
nRBC: 0 % (ref 0.0–0.2)

## 2020-10-26 MED ORDER — INSULIN ASPART 100 UNIT/ML ~~LOC~~ SOLN
0.0000 [IU] | Freq: Three times a day (TID) | SUBCUTANEOUS | Status: DC
  Administered 2020-10-26: 15 [IU] via SUBCUTANEOUS
  Administered 2020-10-26: 11 [IU] via SUBCUTANEOUS
  Administered 2020-10-27: 5 [IU] via SUBCUTANEOUS
  Administered 2020-10-27: 13 [IU] via SUBCUTANEOUS
  Administered 2020-10-27: 3 [IU] via SUBCUTANEOUS
  Administered 2020-10-28 (×2): 8 [IU] via SUBCUTANEOUS

## 2020-10-26 MED ORDER — POLYETHYLENE GLYCOL 3350 17 G PO PACK
17.0000 g | PACK | Freq: Two times a day (BID) | ORAL | Status: DC
  Administered 2020-10-27 – 2020-10-28 (×2): 17 g via ORAL
  Filled 2020-10-26 (×4): qty 1

## 2020-10-26 MED ORDER — INSULIN ASPART 100 UNIT/ML ~~LOC~~ SOLN
0.0000 [IU] | Freq: Every day | SUBCUTANEOUS | Status: DC
  Administered 2020-10-26 – 2020-10-27 (×2): 3 [IU] via SUBCUTANEOUS

## 2020-10-26 NOTE — Progress Notes (Signed)
Radiation Oncology         [phone] ________________________________  Outpatient Re-Consultation - Conducted via telephone due to current COVID-19 concerns for limiting patient exposure  I spoke with the patient to conduct this consult visit via telephone to spare the patient unnecessary potential exposure in the healthcare setting during the current COVID-19 pandemic. The patient was notified in advance and was offered a [place] meeting to allow for face to face communication but unfortunately reported that they did not have the appropriate resources/technology to support such a visit and instead preferred to proceed with a telephone consult.  Name: Colton Bush MRN: [id number]  Date: 10/26/2020  DOB: 02/24/1956  CC:Fry, Ishmael Holter, MD  Truitt Merle, MD     REFERRING PHYSICIAN: Truitt Merle, MD   DIAGNOSIS: The encounter diagnosis was Adenocarcinoma of head of pancreas [facility]).   HISTORY OF PRESENT ILLNESS: Colton Bush is a 65 y.o. male with a history of T2N1 adenocarcinoma of the pancreatic head. He presented with node positive disease involving adjacent vasculature and was not an upfront candidate for surgery. Rather he completed chemoRT in January 2018. He completed additional Xeloda and went on to have diagnostic laparoscopy on 09/19/2016 and unfortunately he still had vascular involvement as well as concerns for 2 liver lesions so his procedure was aborted.  He received gemcitabine and Abraxane but only received 1 cycle as he was also contemplating surgery at [facility], on 11/17/2016 he underwent pancreatectomy with proximal subtotal in total duodenectomy, partial gastrectomy, and Whipple type procedure.  Final pathology revealed a grade 2 adenocarcinoma of the pancreatic head measuring up to 27 mm with perineural invasion, 20 lymph nodes were removed and none contained metastatic disease.  He went on to continue with single agent gemcitabine which was completed in August 2018.  He continued to do  well until imaging showed concerns for possible soft tissue recurrence along the porta hepatis based on PET imaging in September 2021.  This was also consistent with an upward trend in his CA 19-9 since the fall 2021.  Endoscopic ultrasound on 05/27/2020 was performed by Dr. Ardis Hughs but it was very difficult and the region of the porta hepatis was not visualized due to surgically altered anatomy, this was reattempted at [facility] on 06/04/2020 but again quite limited and a fine-needle aspirate was performed of a 17 mm lesion along the common hepatic artery.  Final pathology showed scant blood and hypocellular soft tissue fragments without carcinoma present.  A second attempt at [facility] with IR and transcaval biopsy of the soft tissue around the celiac artery was performed on 09/24/2020. Pathology showed scant glandular epithelium suspicious for metastatic adenocarcinoma and immunostains were consistent with pancreatobiliary origin.  No additional options of surgical resection are available, and given given the local recurrence and good quality of life and performance status the patient was interested in hearing more about reirradiation. Of note he recently had a fall and sustained a right femur fracture. He is currently hospitalized at [facility] and had to be treated for a urinary infection and blood stream infection that had to be treated with antibiotics prior to going to the operating room. He did have surgical pinning of his femur yesterday and is hoping to go home soon. He's contacted today to discuss options of radiotherapy.    PREVIOUS RADIATION THERAPY:   06/28/16 - 08/10/16: Pancreas treated to 45 Gy in 25 fractions. The Pancreas was then boosted to 54 Gy in 5 fractions.   PAST MEDICAL  HISTORY:  Past Medical History:  Diagnosis Date  . Arthritis    left hand  . Bronchitis 1977  . Cancer ([place]) 03/09/2016   pancreatic cancer, sees Dr. Cristino Martes at [facility]   . Depression    takes Cymbalta daily  .  Diabetes mellitus type II    sees Dr. Chalmers Cater   . GERD (gastroesophageal reflux disease)    takes Omeprazole daily  . H/O hiatal hernia   . Hyperlipidemia    takes Zocor daily  . Hypertension    takes Amlodipine daily  . Hypoglycemia 06/18/2017  . Neck pain    C4-7 stenosis and herniated disc  . Neuromuscular disorder ([employer])    hiatal hernia  . Scoliosis    slight  . Spinal cord injury, C5-C7 ([place])    c4-c7  . Stiffness of hand joint    d/t cervical issues       PAST SURGICAL HISTORY: Past Surgical History:  Procedure Laterality Date  . ANTERIOR CERVICAL DECOMP/DISCECTOMY FUSION  08/18/2011   Procedure: ANTERIOR CERVICAL DECOMPRESSION/DISCECTOMY FUSION 3 LEVELS;  Surgeon: Winfield Cunas, MD;  Location: [facility];  Service: Neurosurgery;  Laterality: N/A;  Anterior Cervical Four-Five/Five-Six/Six-Seven Decompression with Fusion, Plating, and Bonegraft  . CARPAL TUNNEL RELEASE  2013   bilateral, per Dr. Christella Noa   . COLONOSCOPY  10-30-14   per Dr. Olevia Perches, clear, repeat in 10 yrs   . egd with esophageal dilation  9-08   per Dr. Olevia Perches  . ERCP N/A 03/01/2016   Procedure: ENDOSCOPIC RETROGRADE CHOLANGIOPANCREATOGRAPHY (ERCP) with brushings and stent;  Surgeon: Doran Stabler, MD;  Location: [facility];  Service: Endoscopy;  Laterality: N/A;  . ESOPHAGOGASTRODUODENOSCOPY (EGD) WITH PROPOFOL N/A 05/27/2020   Procedure: ESOPHAGOGASTRODUODENOSCOPY (EGD) WITH PROPOFOL;  Surgeon: Milus Banister, MD;  Location: [facility];  Service: Endoscopy;  Laterality: N/A;  . EUS N/A 03/09/2016   Procedure: ESOPHAGEAL ENDOSCOPIC ULTRASOUND (EUS) RADIAL;  Surgeon: Milus Banister, MD;  Location: [facility];  Service: Endoscopy;  Laterality: N/A;  . EUS N/A 05/27/2020   Procedure: UPPER ENDOSCOPIC ULTRASOUND (EUS) RADIAL;  Surgeon: Milus Banister, MD;  Location: [facility];  Service: Endoscopy;  Laterality: N/A;  . lymph nodes biopsy    . melanoma rt calf  1999  . PORT-A-CATH REMOVAL N/A  04/03/2019   Procedure: PORT REMOVAL;  Surgeon: Stark Klein, MD;  Location: [facility];  Service: General;  Laterality: N/A;  . PORTACATH PLACEMENT Left 03/22/2016   Procedure: INSERTION PORT-A-CATH;  Surgeon: Stark Klein, MD;  Location: [facility];  Service: General;  Laterality: Left;  . SPINE SURGERY    . TONSILLECTOMY     as a child  . ULNAR TUNNEL RELEASE  2013   right arm, per Dr. Christella Noa   . UPPER GASTROINTESTINAL ENDOSCOPY    . WHIPPLE PROCEDURE N/A 09/19/2016   Procedure: DIAGNOSTIC LAPAROSCOPY, LAPAROSCOPIC LIVER BIOPSY, RETROPERITONEAL EXPLORATION, INTRAOPERATIVE ULTRASOUND;  Surgeon: Stark Klein, MD;  Location: [facility] OR;  Service: General;  Laterality: N/A;     FAMILY HISTORY:  Family History  Problem Relation Age of Onset  . Heart disease Father   . Heart disease Brother 96  . Anesthesia problems Mother   . Heart disease Mother   . Dementia Mother   . Diabetes Sister   . Stroke Sister   . Colon cancer Neg Hx   . Rectal cancer Neg Hx   . Stomach cancer Neg Hx      SOCIAL HISTORY:  reports that he has  never smoked. He has never used smokeless tobacco. He reports that he does not drink alcohol and does not use drugs. The patient is married and resides in [place]. 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   ALLERGIES: Patient has no known allergies.   MEDICATIONS:  No current facility-administered medications for this encounter.   No current outpatient medications on file.   Facility-Administered Medications Ordered in Other Encounters  Medication Dose Route Frequency Provider Last Rate Last Admin  . albuterol (PROVENTIL) (2.5 MG/3ML) 0.083% nebulizer solution 2.5 mg  2.5 mg Nebulization Q6H PRN Swinteck, Aaron Edelman, MD      . amLODipine (NORVASC) tablet 10 mg  10 mg Oral Daily Rod Can, MD   10 mg at 10/25/20 5102  . amoxicillin (AMOXIL) capsule 1,000 mg  1,000 mg Oral Q8H Swinteck, Aaron Edelman, MD   1,000 mg at 10/26/20 0753  . docusate sodium (COLACE) capsule 100  mg  100 mg Oral BID Rod Can, MD   100 mg at 10/25/20 2234  . enoxaparin (LOVENOX) injection 40 mg  40 mg Subcutaneous Q24H Rod Can, MD   40 mg at 10/26/20 0753  . feeding supplement (GLUCERNA SHAKE) (GLUCERNA SHAKE) liquid 237 mL  237 mL Oral TID BM Rod Can, MD   237 mL at 10/24/20 1305  . HYDROmorphone (DILAUDID) 1 MG/ML injection           . insulin aspart (novoLOG) injection 0-5 Units  0-5 Units Subcutaneous QHS Chotiner, Yevonne Aline, MD   2 Units at 10/25/20 2239  . insulin aspart (novoLOG) injection 3 Units  3 Units Subcutaneous TID WC Rod Can, MD   3 Units at 10/26/20 0753  . insulin detemir (LEVEMIR) injection 15 Units  15 Units Subcutaneous QHS Rod Can, MD   15 Units at 10/25/20 2235  . insulin detemir (LEVEMIR) injection 15 Units  15 Units Subcutaneous Daily Swinteck, Aaron Edelman, MD      . lipase/protease/amylase (CREON) capsule 24,000 Units  24,000 Units Oral TID with meals Rod Can, MD   24,000 Units at 10/24/20 1731  . menthol-cetylpyridinium (CEPACOL) lozenge 3 mg  1 lozenge Oral PRN Swinteck, Aaron Edelman, MD       Or  . phenol (CHLORASEPTIC) mouth spray 1 spray  1 spray Mouth/Throat PRN Swinteck, Aaron Edelman, MD      . methocarbamol (ROBAXIN) tablet 500 mg  500 mg Oral Q6H PRN Swinteck, Aaron Edelman, MD       Or  . methocarbamol (ROBAXIN) 500 mg in dextrose 5 % 50 mL IVPB  500 mg Intravenous Q6H PRN Rod Can, MD 100 mL/hr at 10/25/20 1951 500 mg at 10/25/20 1951  . methocarbamol (ROBAXIN) 500 MG/50 ML IVPB           . metoCLOPramide (REGLAN) tablet 5-10 mg  5-10 mg Oral Q8H PRN Swinteck, Aaron Edelman, MD       Or  . metoCLOPramide (REGLAN) injection 5-10 mg  5-10 mg Intravenous Q8H PRN Swinteck, Aaron Edelman, MD      . morphine 2 MG/ML injection 1 mg  1 mg Intravenous Q4H PRN Rod Can, MD   1 mg at 10/24/20 0831  . ondansetron (ZOFRAN) tablet 4 mg  4 mg Oral Q6H PRN Swinteck, Aaron Edelman, MD       Or  . ondansetron (ZOFRAN) injection 4 mg  4 mg Intravenous Q6H PRN  Swinteck, Aaron Edelman, MD      . oxyCODONE (Oxy IR/ROXICODONE) immediate release tablet 5 mg  5 mg Oral Q4H PRN Rod Can, MD  5 mg at 10/26/20 0217  . pantoprazole (PROTONIX) EC tablet 40 mg  40 mg Oral Daily Rod Can, MD   40 mg at 10/25/20 6767  . rosuvastatin (CRESTOR) tablet 20 mg  20 mg Oral Daily Rod Can, MD   20 mg at 10/25/20 0835  . senna (SENOKOT) tablet 8.6 mg  1 tablet Oral BID Swinteck, Aaron Edelman, MD      . sodium chloride 0.9 % injection 10 mL  10 mL Intravenous PRN Truitt Merle, MD   10 mL at 02/09/17 1111  . traZODone (DESYREL) tablet 50 mg  50 mg Oral QHS Rod Can, MD   50 mg at 10/25/20 2234     REVIEW OF SYSTEMS: On review of systems, the patient reports that he is doing well overall. He denies any concerns with abdominal pain, nausea, vomiting. He is sore since surgery and from laying in the bed this week. He denies any dysuria at this time, and has had soft bowel movements in the past few days. He did get up today to work with OT and PT but is hoping that his pace for recovering will pick up soon. Not other complaints are noted.     PHYSICAL EXAM:  Wt Readings from Last 3 Encounters:  10/25/20 195 lb 15.8 oz (88.9 kg)  10/06/20 193 lb (87.5 kg)  10/01/20 193 lb 4 oz (87.7 kg)   Temp Readings from Last 3 Encounters:  10/26/20 98 F (36.7 C) (Oral)  10/06/20 (!) 96.2 F (35.7 C) (Temporal)  10/01/20 97.9 F (36.6 C) (Oral)   BP Readings from Last 3 Encounters:  10/26/20 110/65  10/01/20 130/72  09/09/20 (!) 154/83   Pulse Readings from Last 3 Encounters:  10/26/20 98  10/01/20 92  09/09/20 77   Unable to assess due to encounter type.   ECOG = 1  0 - Asymptomatic (Fully active, able to carry on all predisease activities without restriction)  1 - Symptomatic but completely ambulatory (Restricted in physically strenuous activity but ambulatory and able to carry out work of a light or sedentary nature. For example, light housework, office  work)  2 - Symptomatic, <50% in bed during the day (Ambulatory and capable of all self care but unable to carry out any work activities. Up and about more than 50% of waking hours)  3 - Symptomatic, >50% in bed, but not bedbound (Capable of only limited self-care, confined to bed or chair 50% or more of waking hours)  4 - Bedbound (Completely disabled. Cannot carry on any self-care. Totally confined to bed or chair)  5 - Death   Eustace Pen MM, Creech RH, Tormey DC, et al. [phone]). "Toxicity and response criteria of the [facility]". [place] Oncol. 5 (6): 649-55    LABORATORY DATA:  Lab Results  Component Value Date   WBC 19.2 (H) 10/26/2020   HGB 9.6 (L) 10/26/2020   HCT 29.4 (L) 10/26/2020   MCV 91.9 10/26/2020   PLT 494 (H) 10/26/2020   Lab Results  Component Value Date   NA 132 (L) 10/26/2020   K 4.7 10/26/2020   CL 101 10/26/2020   CO2 24 10/26/2020   Lab Results  Component Value Date   ALT 21 10/26/2020   AST 17 10/26/2020   ALKPHOS 211 (H) 10/26/2020   BILITOT 0.8 10/26/2020      RADIOGRAPHY: DG Chest 1 View  Result Date: 10/18/2020 CLINICAL DATA:  Fall, pain and RIGHT hip. EXAM: CHEST  1 VIEW  COMPARISON:  June 19, 2017 FINDINGS: Trachea midline. Cardiomediastinal contours and hilar structures are normal. Signs of atelectasis and scarring at the lung bases. Evidence of cervical spinal fusion partially imaged on the current study. On limited assessment no acute skeletal process. IMPRESSION: Signs of atelectasis and scarring at the lung bases. Electronically Signed   By: Zetta Bills M.D.   On: 10/18/2020 19:12   DG Knee Right Port  Result Date: 10/19/2020 CLINICAL DATA:  History of distal femur fracture. EXAM: PORTABLE RIGHT KNEE - 1-2 VIEW COMPARISON:  No prior. FINDINGS: Limited two view of exam obtained. Mild soft tissue swelling about the knee cannot be excluded. Severe Tricompartment degenerative change with chondrocalcinosis.  Deformity noted of the proximal fibula consistent with old healed fracture. No acute fracture identified. IMPRESSION: 1.  Mild soft tissue swelling about the knee cannot be excluded. 2. Severe tricompartment degenerative change with chondrocalcinosis. Deformity noted the proximal right fibula consistent with old healed fracture. No acute bony abnormality identified. Electronically Signed   By: Marcello Moores  Register   On: 10/19/2020 09:46   DG C-Arm 1-60 Min-No Report  Result Date: 10/25/2020 Fluoroscopy was utilized by the requesting physician.  No radiographic interpretation.   ECHOCARDIOGRAM COMPLETE  Result Date: 10/22/2020    ECHOCARDIOGRAM REPORT   Patient Name:   WILGUS DEYTON Date of Exam: 10/22/2020 Medical Rec #:  [id number]      Height:       70.0 in Accession #:    [id number]     Weight:       196.0 lb Date of Birth:  02-Sep-1955      BSA:          2.069 m Patient Age:    9 years       BP:           136/69 mmHg Patient Gender: M              HR:           79 bpm. Exam Location:  Inpatient Procedure: 2D Echo, Cardiac Doppler and Color Doppler Indications:    Bacteremia  History:        Patient has no prior history of Echocardiogram examinations.                 Signs/Symptoms:Bacteremia and UTI; Risk Factors:Hypertension,                 Diabetes and Dyslipidemia. Pancreatic cancer.  Sonographer:    Dustin Flock Referring Phys: [phone] A CALDWELL POWELL JR  Sonographer Comments: Image acquisition challenging due to respiratory motion. IMPRESSIONS  1. Left ventricular ejection fraction, by estimation, is 55 to 60%. The left ventricle has normal function. The left ventricle has no regional wall motion abnormalities. There is mild concentric left ventricular hypertrophy. Left ventricular diastolic parameters are consistent with Grade I diastolic dysfunction (impaired relaxation).  2. Right ventricular systolic function is normal. The right ventricular size is normal.  3. The mitral valve is normal in  structure. No evidence of mitral valve regurgitation. No evidence of mitral stenosis.  4. The aortic valve is normal in structure. Aortic valve regurgitation is not visualized. No aortic stenosis is present.  5. The inferior vena cava is normal in size with greater than 50% respiratory variability, suggesting right atrial pressure of 3 mmHg. Conclusion(s)/Recommendation(s): No evidence of valvular vegetations on this transthoracic echocardiogram. Would recommend a transesophageal echocardiogram to exclude infective endocarditis if clinically indicated. FINDINGS  Left Ventricle: Left ventricular  ejection fraction, by estimation, is 55 to 60%. The left ventricle has normal function. The left ventricle has no regional wall motion abnormalities. The left ventricular internal cavity size was normal in size. There is  mild concentric left ventricular hypertrophy. Left ventricular diastolic parameters are consistent with Grade I diastolic dysfunction (impaired relaxation). Indeterminate filling pressures. Right Ventricle: The right ventricular size is normal. No increase in right ventricular wall thickness. Right ventricular systolic function is normal. Left Atrium: Left atrial size was normal in size. Right Atrium: Right atrial size was normal in size. Pericardium: There is no evidence of pericardial effusion. Mitral Valve: The mitral valve is normal in structure. Mild mitral annular calcification. No evidence of mitral valve regurgitation. No evidence of mitral valve stenosis. Tricuspid Valve: The tricuspid valve is normal in structure. Tricuspid valve regurgitation is not demonstrated. No evidence of tricuspid stenosis. Aortic Valve: The aortic valve is normal in structure. Aortic valve regurgitation is not visualized. No aortic stenosis is present. Pulmonic Valve: The pulmonic valve was normal in structure. Pulmonic valve regurgitation is not visualized. No evidence of pulmonic stenosis. Aorta: The aortic root is  normal in size and structure. Venous: The inferior vena cava is normal in size with greater than 50% respiratory variability, suggesting right atrial pressure of 3 mmHg. IAS/Shunts: No atrial level shunt detected by color flow Doppler.  LEFT VENTRICLE PLAX 2D LVIDd:         3.90 cm  Diastology LVIDs:         2.70 cm  LV e' medial:    7.72 cm/s LV PW:         1.30 cm  LV E/e' medial:  9.3 LV IVS:        1.30 cm  LV e' lateral:   6.64 cm/s LVOT diam:     2.50 cm  LV E/e' lateral: 10.8 LV SV:         102 LV SV Index:   49 LVOT Area:     4.91 cm  RIGHT VENTRICLE RV Basal diam:  2.90 cm RV S prime:     10.80 cm/s TAPSE (M-mode): 2.9 cm LEFT ATRIUM             Index       RIGHT ATRIUM           Index LA diam:        3.30 cm 1.59 cm/m  RA Area:     12.20 cm LA Vol (A2C):   54.9 ml 26.53 ml/m RA Volume:   29.40 ml  14.21 ml/m LA Vol (A4C):   29.8 ml 14.40 ml/m LA Biplane Vol: 41.4 ml 20.01 ml/m  AORTIC VALVE LVOT Vmax:   92.40 cm/s LVOT Vmean:  63.500 cm/s LVOT VTI:    0.207 m  AORTA Ao Root diam: 3.20 cm MITRAL VALVE MV Area (PHT): 7.16 cm     SHUNTS MV Decel Time: 106 msec     Systemic VTI:  0.21 m MV E velocity: 71.80 cm/s   Systemic Diam: 2.50 cm MV A velocity: 111.00 cm/s MV E/A ratio:  0.65 Mihai Croitoru MD Electronically signed by Sanda Klein MD Signature Date/Time: 10/22/2020/4:07:33 PM    Final    DG HIP OPERATIVE UNILAT W OR W/O PELVIS RIGHT  Result Date: 10/25/2020 CLINICAL DATA:  IM nail right femur EXAM: OPERATIVE right HIP (WITH PELVIS IF PERFORMED) 4 VIEWS TECHNIQUE: Fluoroscopic spot image(s) were submitted for interpretation post-operatively. COMPARISON:  10/18/2020 FINDINGS: Four low resolution intraoperative spot  views of the right hip. Total fluoroscopy time was 48 seconds. The images demonstrate intramedullary rod and distal screw fixation of right femur for comminuted intertrochanteric fracture. IMPRESSION: Intraoperative fluoroscopic assistance provided during surgical fixation of right  proximal femoral fracture Electronically Signed   By: Donavan Foil M.D.   On: 10/25/2020 19:10   DG Hip Unilat With Pelvis 2-3 Views Right  Result Date: 10/18/2020 CLINICAL DATA:  Fall, hip pain status post fall EXAM: DG HIP (WITH OR WITHOUT PELVIS) 2-3V RIGHT COMPARISON:  Prior CT imaging of the chest, abdomen and pelvis. FINDINGS: No fracture of the bony pelvis.  Spinal degenerative changes. Comminuted RIGHT proximal femoral fracture, intratrochanteric type with distraction of the lesser trochanter. Mild displacement of distal femur relative to proximal femur, slight anterior displacement 2-3 mm. Mild to moderate varus angulation at the fracture site. Sclerotic lesion in the LEFT iliac bone unchanged likely large bone island. IMPRESSION: Comminuted mildly angulated RIGHT intratrochanteric fracture. Electronically Signed   By: Zetta Bills M.D.   On: 10/18/2020 19:17       IMPRESSION/PLAN: 1. Recurrent Stage IIB, cT2, N1, M0 adenocarcinoma of the pancreas s/p Whipple with local disease. Dr. [name] discusses the pathology findings and reviews the nature of recurrent local disease. Dr. [name] has reviewed his previous imaging and feels that while re-irradiation does post increased risks of long term scaring and injury to to adjacent bowel, that there is room for consideration of additional treatment. He would like to proceed with simulation and fusion of his prior treatment imaging in order to determine the overall fractionation scheme. Treatment could be palliative in nature if we are limited by his prior treatment fields, or possibly definitive in dosing. SBRT style therapy however is not anticipated given his prior treatment history. We discussed the risks, benefits, short, and long term effects of radiotherapy,  and the patient is interested in proceeding. Dr. [name] discusses the delivery and logistics of radiotherapy and anticipates a course of 2-5 1/2 weeks of radiotherapy. He will simulate tomorrow  and we will use oral contrast as well. He will consent for treatment when he simulates tomorrow. We will also reach out to Dr. [name] to determine if she plans chemosensitization.  2. Acute fracture of the right femur. The patient has undergone surgical pinning and we will follow this expectantly. He is hoping to discharge home rather than to a SNF or rehabilitation facility.      Given current concerns for patient exposure during the COVID-19 pandemic, this encounter was conducted via telephone.  The patient has provided two factor identification and has given verbal consent for this type of encounter and has been advised to only accept a meeting of this type in a secure network environment. The time spent during this encounter was 45 minutes including preparation, discussion, and coordination of the patient's care. The attendants for this meeting include Blenda Nicely, RN, Dr. [name], [name] Pedro  and [name].  During the encounter,  Blenda Nicely, RN, Dr. [name], and [name] Pedro were located at [facility] Radiation Oncology Department.  KALVEN GANIM was located at [facility] hospital.  The above documentation reflects my direct findings during this shared patient visit. Please see the separate note by Dr. [name] on this date for the remainder of the patient's plan of care.    Carola Rhine, PAC

## 2020-10-26 NOTE — Evaluation (Signed)
Physical Therapy Evaluation Patient Details Name: Colton Bush MRN: [id number] DOB: 06-07-1956 Today's Date: 10/26/2020   History of Present Illness  Patient is a 65 year old male PMH of recurrent pancreatic cancer, insulin-dependent diabetes, hypertension, dyslipidemia who presented to the ER on 10/18/2020 with inability to walk after ground-level fall and was found to have right hip fracture. Pt s/p IM nail R hip on 3/28  Clinical Impression  Patient is s/p above surgery resulting in functional limitations due to the deficits listed below (see PT Problem List).  Patient will benefit from skilled PT to increase their independence and safety with mobility to allow discharge to the venue listed below.  Pt assisted with performing three sit to stands.  Pt only able to stand fully erect once and unable to shift weight for taking steps or pivot for transfer.  Pt requiring increased assist at this time and would benefit from d/c to SNF.    Follow Up Recommendations SNF    Equipment Recommendations  Rolling walker with 5" wheels;3in1 (PT)    Recommendations for Other Services       Precautions / Restrictions Precautions Precautions: Fall Restrictions Weight Bearing Restrictions: No Other Position/Activity Restrictions: WBAT      Mobility  Bed Mobility Overal bed mobility: Needs Assistance Bed Mobility: Supine to Sit;Sit to Supine     Supine to sit: Mod assist;HOB elevated Sit to supine: Mod assist   General bed mobility comments: assist for R LE over EOB and scooting to EOB; assist for bil LEs onto bed upon returning to supine    Transfers Overall transfer level: Needs assistance Equipment used: Rolling walker (2 wheeled) Transfers: Sit to/from Stand Sit to Stand: From elevated surface;Mod assist         General transfer comment: verbal cues for UE and LE positioning; pt able to stand once however unable to perform trunk extension so returned to sitting; performed again and  pt able to briefly stand fully erect; attempted to stand pivot to [facility] on 3rd stand however pt unable to complete safely so assisted back to supine; nurse tech notified of pt request for bed pan  Ambulation/Gait                Stairs            Wheelchair Mobility    Modified Rankin (Stroke Patients Only)       Balance Overall balance assessment: History of Falls;Needs assistance         Standing balance support: Bilateral upper extremity supported Standing balance-[name]: Zero Standing balance comment: requiring UE support and external assist                             Pertinent Vitals/Pain Pain Assessment: 0-10 Pain Score: 6  Pain Location: R hip with bed mobility Pain Descriptors / Indicators: Sore;Aching Pain Intervention(s): Monitored during session;Repositioned;Premedicated before session    Home Living Family/patient expects to be discharged to:: Private residence Living Arrangements: Spouse/significant other Available Help at Discharge: Family;Available 24 hours/day Type of Home: House Home Access: Stairs to enter Entrance Stairs-Rails: None Entrance Stairs-Number of Steps: (side of house) 1 step then laundry room then 2 steps to main level Home Layout: One level Home Equipment: None      Prior Function Level of Independence: Independent               Hand Dominance  Extremity/Trunk Assessment        Lower Extremity Assessment Lower Extremity Assessment: RLE deficits/detail RLE Deficits / Details: anticipated post op hip weakness RLE: Unable to fully assess due to pain       Communication   Communication: No difficulties  Cognition Arousal/Alertness: Awake/alert Behavior During Therapy: Flat affect Overall Cognitive Status: Within Functional Limits for tasks assessed                                        General Comments      Exercises     Assessment/Plan    PT Assessment  Patient needs continued PT services  PT Problem List Decreased strength;Decreased balance;Decreased mobility;Decreased activity tolerance;Decreased knowledge of use of DME;Pain       PT Treatment Interventions DME instruction;Therapeutic exercise;Gait training;Functional mobility training;Therapeutic activities;Patient/family education;Balance training    PT Goals (Current goals can be found in the Care Plan section)  Acute Rehab PT Goals PT Goal Formulation: With patient Time For Goal Achievement: 11/09/20 Potential to Achieve Goals: Good    Frequency Min 3X/week   Barriers to discharge        Co-evaluation               AM-PAC PT "6 Clicks" Mobility  Outcome Measure Help needed turning from your back to your side while in a flat bed without using bedrails?: A Lot Help needed moving from lying on your back to sitting on the side of a flat bed without using bedrails?: A Lot Help needed moving to and from a bed to a chair (including a wheelchair)?: A Lot Help needed standing up from a chair using your arms (Bush.g., wheelchair or bedside chair)?: Total Help needed to walk in hospital room?: Total Help needed climbing 3-5 steps with a railing? : Total 6 Click Score: 9    End of Session Equipment Utilized During Treatment: Gait belt Activity Tolerance: Patient tolerated treatment well Patient left: with call bell/phone within reach;in bed;with bed alarm set   PT Visit Diagnosis: Other abnormalities of gait and mobility (R26.89)    Time: 9767-3419 PT Time Calculation (min) (ACUTE ONLY): 27 min   Charges:   PT Evaluation $PT Eval Moderate Complexity: 1 Mod PT Treatments $Therapeutic Activity: 8-22 mins   Jannette Spanner PT, DPT Acute Rehabilitation Services Pager: [phone] Office: [phone]  Colton Bush 10/26/2020, 3:30 PM

## 2020-10-26 NOTE — Evaluation (Signed)
Occupational Therapy Evaluation Patient Details Name: Colton Bush MRN: [id number] DOB: 04/08/1956 Today's Date: 10/26/2020    History of Present Illness Patient is a 65 year old male PMH of recurrent pancreatic cancer, insulin-dependent diabetes, hypertension, dyslipidemia who presented to the ER on 10/18/2020 with inability to walk after ground-level fall and was found to have right hip fracture. Pt s/p IM nail R hip on 3/28   Clinical Impression   Patient lives at home with spouse in a single level home with 1 step into laundry room then 2 steps up to living area. At baseline patient is independent with self care, enjoys being outside. Currently patient limited by pain, weakness, limited balance and activity tolerance. Patient needs increased time to follow multimodal cues for body mechanics during bed mobility and sit to stand. Patient needing set up to min A for UB ADL, total A for LB ADL and mod A to power up to standing from EOB with walker. Patient with kyphotic posture having difficulty uprighting trunk and pushing through walker with UEs. Also note R LE buckling therefore deferred further mobility and returned to EOB. Patient does report some dizziness, BP taken 103/86. Patient needed max x2 to return to supine. Due to increased level of assist needed for ADLs and functional mobility would currently recommend ST rehab. Patient does appear very motivated to go home "I want to be outside," pending progress may be able to D/C with home health and spouse assist.     Follow Up Recommendations  SNF;Other (comment) (vs HH with 24/7 if patient declines/pending improvement)    Equipment Recommendations  3 in 1 bedside commode;Other (comment) (rolling walker)       Precautions / Restrictions Precautions Precautions: Fall Restrictions Other Position/Activity Restrictions: WBAT R LE      Mobility Bed Mobility Overal bed mobility: Needs Assistance Bed Mobility: Supine to Sit;Sit to Supine      Supine to sit: Mod assist;HOB elevated Sit to supine: Max assist;+2 for physical assistance   General bed mobility comments: increased time to follow cues, needed assist to bring R LE toward EOB and use of bed pad to pivot hips to EOB. increased time to push through L UE to sit trunk upright. patient attempt scooting to [facility] at end of session needed maximal assistance therefore max A x2 to lifts LEs and guide trunk back to bed    Transfers Overall transfer level: Needs assistance Equipment used: Rolling walker (2 wheeled) Transfers: Sit to/from Stand Sit to Stand: From elevated surface;Mod assist         General transfer comment: please see toilet transfer from ADL section; R LE buckling therefore unsafe to attempt further transfer away from EOB    Balance Overall balance assessment: History of Falls;Needs assistance Sitting-balance support: Feet supported;Single extremity supported;Bilateral upper extremity supported Sitting balance-[name]: Poor Sitting balance - Comments: reliant on at least unilateral UE support   Standing balance support: Bilateral upper extremity supported Standing balance-[name]: Poor Standing balance comment: reliant on UEs and mod A                           ADL either performed or assessed with clinical judgement   ADL Overall ADL's : Needs assistance/impaired Eating/Feeding: Set up;Bed level   Grooming: Set up;Bed level   Upper Body Bathing: Minimal assistance;Sitting   Lower Body Bathing: Total assistance;Sitting/lateral leans   Upper Body Dressing : Minimal assistance;Sitting Upper Body Dressing Details (indicate  cue type and reason): heavily reliant on unilateral UE support Lower Body Dressing: Total assistance;Sitting/lateral leans Lower Body Dressing Details (indicate cue type and reason): limited by pain, decreased balance and activity tolerance Toilet Transfer: Moderate assistance;Cueing for safety;Cueing for  sequencing;RW Toilet Transfer Details (indicate cue type and reason): sit to stand from edge of bed  with bed height elevated, needing multiple attempts, cues for body mechanics and mod A to power up to standing. poor standing tolerance and R LE buckling therefore returned to EOB unable to safely complete toilet transfer [place] and Hygiene: Total assistance;Bed level       Functional mobility during ADLs: Moderate assistance;Cueing for safety;Cueing for sequencing;Rolling walker General ADL Comments: patient requiring increased assistance for ADLs due to pain, decreased strength, activity tolerance, balance and safety awareness                  Pertinent Vitals/Pain Pain Assessment: 0-10 Pain Score: 4  Pain Location: R hip with bed mobility Pain Descriptors / Indicators: Sore Pain Intervention(s): RN gave pain meds during session     Hand Dominance Right   Extremity/Trunk Assessment Upper Extremity Assessment Upper Extremity Assessment: Generalized weakness   Lower Extremity Assessment Lower Extremity Assessment: Defer to PT evaluation   Cervical / Trunk Assessment Cervical / Trunk Assessment: Kyphotic   Communication Communication Communication: No difficulties   Cognition Arousal/Alertness: Awake/alert Behavior During Therapy: Flat affect Overall Cognitive Status: Within Functional Limits for tasks assessed                                 General Comments: some delayed direction following   General Comments  BP taken once seated after attempting to stand reading 103/86. patient looking pale and reporting some dizziness therefore deferred further mobility and assisted back to bed            Home Living Family/patient expects to be discharged to:: Private residence Living Arrangements: Spouse/significant other Available Help at Discharge: Family;Available 24 hours/day Type of Home: House Home Access: Stairs to  enter [employer] of Steps: (side of house) 1 step then laundry room then 2 steps to main level Entrance Stairs-Rails: None Home Layout: One level     Bathroom Shower/Tub: Walk-in shower;Tub/shower unit   Bathroom Toilet: Standard     Home Equipment: None          Prior Functioning/Environment Level of Independence: Independent                 OT Problem List: Decreased strength;Decreased activity tolerance;Impaired balance (sitting and/or standing);Decreased safety awareness;Decreased knowledge of use of DME or AE;Decreased knowledge of precautions;Pain      OT Treatment/Interventions: Self-care/ADL training;DME and/or AE instruction;Therapeutic activities;Patient/family education;Balance training    OT Goals(Current goals can be found in the care plan section) Acute Rehab OT Goals Patient Stated Goal: "be outside" OT Goal Formulation: With patient Time For Goal Achievement: 11/09/20 Potential to Achieve Goals: Good  OT Frequency: Min 2X/week    AM-PAC OT "6 Clicks" Daily Activity     Outcome Measure Help from another person eating meals?: A Little Help from another person taking care of personal grooming?: A Little Help from another person toileting, which includes using toliet, bedpan, or urinal?: Total Help from another person bathing (including washing, rinsing, drying)?: A Lot Help from another person to put on and taking off regular upper body clothing?: A Little Help from another person  to put on and taking off regular lower body clothing?: Total 6 Click Score: 13   End of Session Equipment Utilized During Treatment: Rolling walker Nurse Communication: Mobility status  Activity Tolerance: Patient limited by fatigue;Patient limited by pain Patient left: in bed;with call bell/phone within reach;with bed alarm set;with nursing/sitter in room  OT Visit Diagnosis: Unsteadiness on feet (R26.81);Other abnormalities of gait and mobility (R26.89);Muscle  weakness (generalized) (M62.81);History of falling (Z91.81);Pain Pain - Right/Left: Right Pain - part of body: Hip;Leg                Time: 3358-2518 OT Time Calculation (min): 49 min Charges:  OT General Charges $OT Visit: 1 Visit OT Evaluation $OT Eval Moderate Complexity: 1 Mod OT Treatments $Self Care/Home Management : 23-37 mins  Delbert Phenix OT OT pager: [facility] 10/26/2020, 10:25 AM

## 2020-10-26 NOTE — Plan of Care (Signed)
Pt returned from PACU and showing good signs of recovery. VS stable and on 2L [place]. Patient tolerated clear liquids well. Pain managed by PRN meds and pt expresses decrease in pain. Sleeping on and off, reporting relief from surgery, and eagerness for PT in the morning.

## 2020-10-26 NOTE — Progress Notes (Signed)
PROGRESS NOTE    Colton Bush  OBS:[id number] DOB: 12/10/1955 DOA: 10/18/2020 PCP: Colton Morale, MD   Chief Complaint  Patient presents with  . Fall   Brief Narrative:  Colton Bush is Colton Bush 65 y.o. male with Colton Bush history of recurrent pancreatic cancer, diabetes mellitus, hypertension, hyperlipidemia. Patient presented secondary to Colton Bush fall and subsequent right hip fracture. Surgery was delayed in setting of infectious work up due to fever and positive blood cultures.  He grew strep anginosis in 1/4 cx and ID was consulted.  Repeat cultures were negative and surgery on 3/27 was delayed again due to Colton Bush power outage.  Finally had surgery on 3/28. D/c pending ortho clearance, therapy eval/recs.  Assessment & Plan:   Principal Problem:   Closed right hip fracture, initial encounter [facility]) Active Problems:   Essential hypertension   Type 2 diabetes mellitus with other specified complication ([place])   Adenocarcinoma of head of pancreas ([place])   Fall from ground level  Right hip fracture Right comminuted/angulated intertrochanteric fracture. Orthopedic surgery consulted. S/p R femur intramedullary fixation on 3/28 Pain management, bowel regimen Therapy DVT ppx per orthopedics  Strep Anginosis Bacteremia  E. Coli Urinary Tract Infection   SIRS Fever 3/22 PM as well as worsened leukocytosis 3/23 -> seems out of proportion to what would be expected with hip fx due to reactive leukocytosis or simple UTI.   Blood cx with 1/4 with strep anginosis - unclear significance Repeat blood cx 3/25 AM NGTD x3 days E. Coli UTI, pan sensitive Continue ceftriaxone, defer abx to ID -> narrowed to amoxicillin to complete course (covers UTI and strep bacteremia) Will consult ID, appreciate assistance -> recommending TTE (EF 47-65%, grade 1 diastolic dysfunction - without evidence of valvular vegetations - see report).  Now signed off, recommended amoxicillin to complete course of abx.  Fall Resulting in above.  No syncope.  Diabetes mellitus, type 2 Patient is on Levemir and Novolog sliding scale as an outpatient. Conflicting diagnoses on chart review between type 1 and type 2. Started on Levemir 15 units BID. - continue levemir BID here - hyperglycemic today, adjust as needed -Continue Levemir and SSI  Pancreatic cancer Recurrence. Patient follows with Dr. Burr Bush as an outpatient. Plan for radiation therapy as an outpatient. Patient plans to follow up with Dr. Lisbeth Bush, radiation oncology. -Continue Creon -added Dr. Burr Bush to treatment team - see 3/24 note -rad onc seeing him today, appreciate recs  Primary hypertension -Continue amlodipine  Hyperlipidemia -Continue Crestor  GERD -Continue Protonix  DVT prophylaxis: lovenox  Code Status: full  Family Communication: none at bedside Disposition:   Status is: Inpatient  Remains inpatient appropriate because:Inpatient level of care appropriate due to severity of illness   Dispo: The patient is from: Home              Anticipated d/c is to: pending              Patient currently is not medically stable to d/c.   Difficult to place patient No   Consultants:   ortho  Procedures: Echo 1. Left ventricular ejection fraction, by estimation, is 55 to 60%. The  left ventricle has normal function. The left ventricle has no regional  wall motion abnormalities. There is mild concentric left ventricular  hypertrophy. Left ventricular diastolic  parameters are consistent with Grade I diastolic dysfunction (impaired  relaxation).  2. Right ventricular systolic function is normal. The right ventricular  size is normal.  3. The mitral  valve is normal in structure. No evidence of mitral valve  regurgitation. No evidence of mitral stenosis.  4. The aortic valve is normal in structure. Aortic valve regurgitation is  not visualized. No aortic stenosis is present.  5. The inferior vena cava is normal in size with greater than 50%   respiratory variability, suggesting right atrial pressure of 3 mmHg.   Antimicrobials:  Anti-infectives (From admission, onward)   Start     Dose/Rate Route Frequency Ordered Stop   10/25/20 2300  ceFAZolin (ANCEF) IVPB 2g/100 mL premix        2 g 200 mL/hr over 30 Minutes Intravenous Every 6 hours 10/25/20 1912 10/26/20 0636   10/25/20 1704  ceFAZolin (ANCEF) 2-4 GM/100ML-% IVPB       Note to Pharmacy: Colton Bush   : cabinet override      10/25/20 1704 10/26/20 0514   10/25/20 0900  amoxicillin (AMOXIL) capsule 1,000 mg        1,000 mg Oral Every 8 hours 10/25/20 0801 10/27/20 0559   10/25/20 0600  amoxicillin (AMOXIL) capsule 1,000 mg  Status:  Discontinued        1,000 mg Oral Every 8 hours 10/24/20 1355 10/25/20 0801   10/24/20 1059  ceFAZolin (ANCEF) 2-4 GM/100ML-% IVPB  Status:  Discontinued       Note to Pharmacy: Colton Bush   : cabinet override      10/24/20 1059 10/24/20 1109   10/22/20 1000  cefTRIAXone (ROCEPHIN) 2 g in sodium chloride 0.9 % 100 mL IVPB  Status:  Discontinued        2 g 200 mL/hr over 30 Minutes Intravenous Every 24 hours 10/21/20 1343 10/24/20 1354   10/21/20 1400  cefTRIAXone (ROCEPHIN) 1 g in sodium chloride 0.9 % 100 mL IVPB        1 g 200 mL/hr over 30 Minutes Intravenous  Once 10/21/20 1343 10/21/20 1624   10/21/20 1330  ceFAZolin (ANCEF) IVPB 2g/100 mL premix  Status:  Discontinued        2 g 200 mL/hr over 30 Minutes Intravenous On call to O.R. 10/21/20 1323 10/21/20 1350   10/21/20 1330  ceFAZolin (ANCEF) IVPB 2g/100 mL premix  Status:  Discontinued        2 g 200 mL/hr over 30 Minutes Intravenous  Once 10/21/20 1324 10/21/20 1325   10/21/20 1300  ceFAZolin (ANCEF) 2-4 GM/100ML-% IVPB       Note to Pharmacy: Colton Bush   : cabinet override      10/21/20 1300 10/22/20 0114   10/20/20 1230  cefTRIAXone (ROCEPHIN) 1 g in sodium chloride 0.9 % 100 mL IVPB  Status:  Discontinued        1 g 200 mL/hr over 30 Minutes Intravenous Every 24  hours 10/20/20 1141 10/21/20 1354   10/20/20 0600  ceFAZolin (ANCEF) IVPB 2g/100 mL premix        2 g 200 mL/hr over 30 Minutes Intravenous On call to O.R. 10/19/20 2233 10/21/20 0559     Subjective: Asking for his wife to be able to bring walker from home He doesn't like the one here  Objective: Vitals:   10/26/20 0312 10/26/20 0522 10/26/20 1007 10/26/20 1349  BP: 114/62 (!) 111/59 108/68 110/65  Pulse: 83 71 99 98  Resp: 16 16 18 18   Temp: 98.1 F (36.7 C) 98.9 F (37.2 C)  98 F (36.7 C)  TempSrc: Oral   Oral  SpO2: 95% 98% 95% 93%  Weight:  Height:        Intake/Output Summary (Last 24 hours) at 10/26/2020 1655 Last data filed at 10/26/2020 0640 Gross per 24 hour  Intake 3080 ml  Output 2750 ml  Net 330 ml   Filed Weights   10/18/20 2330 10/21/20 1326 10/25/20 1611  Weight: 88.9 kg 88.9 kg 88.9 kg    Examination:  General: No acute distress. Cardiovascular: Heart sounds show Mahkayla Preece regular rate, and rhythm.  Lungs: Clear to auscultation bilaterally  Abdomen: Soft, nontender, nondistended Neurological: Alert and oriented 3. Moves all extremities 4. Cranial nerves II through XII grossly intact. Skin: Warm and dry. No rashes or lesions. Extremities: mild edema RLE > LLE - dressing intact to RLE     Data Reviewed: I have personally reviewed following labs and imaging studies  CBC: Recent Labs  Lab 10/22/20 0307 10/23/20 0321 10/24/20 0251 10/25/20 0316 10/26/20 0330  WBC 13.6* 12.5* 12.7* 14.7* 19.2*  NEUTROABS 10.9* 9.5* 10.2* 12.0* 18.2*  HGB 10.3* 10.4* 10.7* 10.3* 9.6*  HCT 31.5* 32.0* 32.4* 31.3* 29.4*  MCV 90.0 91.7 90.5 92.3 91.9  PLT 446* 511* 554* 536* 494*    Basic Metabolic Panel: Recent Labs  Lab 10/21/20 0305 10/22/20 0307 10/23/20 0321 10/24/20 0251 10/25/20 0316 10/26/20 0330  NA 133* 133* 134* 134* 132* 132*  K 3.4* 3.6 3.6 4.3 4.2 4.7  CL 102 103 103 103 102 101  CO2 24 23 23 24 24 24   GLUCOSE 217* 189* 195* 336* 312*  347*  BUN 19 15 18 22 20  24*  CREATININE 0.78 0.76 0.66 0.72 0.62 0.64  CALCIUM 7.5* 7.7* 7.9* 7.9* 8.0* 8.3*  MG 2.2 2.0 2.2 2.1 2.0  --   PHOS 2.6 2.7 2.8 2.8 3.1 4.1    GFR: Estimated Creatinine Clearance: 100.6 mL/min (by C-G formula based on SCr of 0.64 mg/dL).  Liver Function Tests: Recent Labs  Lab 10/22/20 0307 10/23/20 0321 10/24/20 0251 10/25/20 0316 10/26/20 0330  AST 22 16 18  12* 17  ALT 25 21 21 18 21   ALKPHOS 210* 198* 255* 218* 211*  BILITOT 0.8 0.8 0.6 0.5 0.8  PROT 7.0 7.0 7.0 6.9 7.2  ALBUMIN 2.2* 2.2* 2.1* 2.1* 2.3*    CBG: Recent Labs  Lab 10/25/20 1904 10/25/20 2040 10/26/20 0739 10/26/20 1114 10/26/20 1644  GLUCAP 229* 243* 292* 355* 343*     Recent Results (from the past 240 hour(s))  Resp Panel by RT-PCR (Flu Damico Partin&B, Covid) Nasopharyngeal Swab     Status: None   Collection Time: 10/18/20  7:11 PM   Specimen: Nasopharyngeal Swab; Nasopharyngeal(NP) swabs in vial transport medium  Result Value Ref Range Status   SARS Coronavirus 2 by RT PCR NEGATIVE NEGATIVE Final    Comment: (NOTE) SARS-CoV-2 target nucleic acids are NOT DETECTED.  The SARS-CoV-2 RNA is generally detectable in upper respiratory specimens during the acute phase of infection. The lowest concentration of SARS-CoV-2 viral copies this assay can detect is 138 copies/mL. Carah Barrientes negative result does not preclude SARS-Cov-2 infection and should not be used as the sole basis for treatment or other patient management decisions. Keisha Amer negative result may occur with  improper specimen collection/handling, submission of specimen other than nasopharyngeal swab, presence of viral mutation(s) within the areas targeted by this assay, and inadequate number of viral copies(<138 copies/mL). Chardai Gangemi negative result must be combined with clinical observations, patient history, and epidemiological information. The expected result is Negative.  Fact Sheet for Patients:   EntrepreneurPulse.com.au  Fact Sheet for Healthcare  Providers:  IncredibleEmployment.be  This test is no t yet approved or cleared by the [place] and  has been authorized for detection and/or diagnosis of SARS-CoV-2 by FDA under an Emergency Use Authorization (EUA). This EUA will remain  in effect (meaning this test can be used) for the duration of the COVID-19 declaration under Section 564(b)(1) of the Act, 21 U.S.C.section 360bbb-3(b)(1), unless the authorization is terminated  or revoked sooner.       Influenza Jericca Russett by PCR NEGATIVE NEGATIVE Final   Influenza B by PCR NEGATIVE NEGATIVE Final    Comment: (NOTE) The Xpert Xpress SARS-CoV-2/FLU/RSV plus assay is intended as an aid in the diagnosis of influenza from Nasopharyngeal swab specimens and should not be used as Deanna Boehlke sole basis for treatment. Nasal washings and aspirates are unacceptable for Xpert Xpress SARS-CoV-2/FLU/RSV testing.  Fact Sheet for Patients: EntrepreneurPulse.com.au  Fact Sheet for Healthcare Providers: IncredibleEmployment.be  This test is not yet approved or cleared by the [place] FDA and has been authorized for detection and/or diagnosis of SARS-CoV-2 by FDA under an Emergency Use Authorization (EUA). This EUA will remain in effect (meaning this test can be used) for the duration of the COVID-19 declaration under Section 564(b)(1) of the Act, 21 U.S.C. section 360bbb-3(b)(1), unless the authorization is terminated or revoked.  Performed at [facility], [place] [address] [place], [place] [address]   Surgical pcr screen     Status: Abnormal   Collection Time: 10/19/20 12:47 AM   Specimen: Nasal Mucosa; Nasal Swab  Result Value Ref Range Status   MRSA, PCR NEGATIVE NEGATIVE Final   Staphylococcus aureus POSITIVE (Ashish Rossetti) NEGATIVE Final    Comment: RESULT CALLED TO, READ BACK BY AND VERIFIED WITH: BOBBY,  RN @ [phone] ON 10/19/20 C VARNER (NOTE) The Xpert SA Assay (FDA approved for NASAL specimens in patients 33 years of age and older), is one component of Loukisha Gunnerson comprehensive surveillance program. It is not intended to diagnose infection nor to guide or monitor treatment. Performed at [facility], [place] [address] [place], [place] [address]   Culture, blood (routine x 2)     Status: None   Collection Time: 10/20/20  7:55 AM   Specimen: BLOOD  Result Value Ref Range Status   Specimen Description   Final    BLOOD RIGHT ANTECUBITAL Performed at [place] [address] [place], [place] [address]    Special Requests   Final    BOTTLES DRAWN AEROBIC AND ANAEROBIC Blood Culture results may not be optimal due to an inadequate volume of blood received in culture bottles Performed at [place] [address] [place], [place] [address]    Culture   Final    NO GROWTH 5 DAYS Performed at [facility], [place] [address] [place], [place] [address]    Report Status 10/25/2020 FINAL  Final  Culture, blood (routine x 2)     Status: Abnormal   Collection Time: 10/20/20  7:56 AM   Specimen: BLOOD  Result Value Ref Range Status   Specimen Description   Final    BLOOD RIGHT ANTECUBITAL Performed at [place] [address], [place], [place] [address]    Special Requests   Final    BOTTLES DRAWN AEROBIC AND ANAEROBIC Blood Culture results may not be optimal due to an inadequate volume of blood received in culture bottles Performed at [place] [address] [place],  [place] [address]    Culture  Setup Time   Final    AEROBIC BOTTLE ONLY GRAM POSITIVE COCCI CRITICAL RESULT CALLED TO, READ BACK BY AND VERIFIED WITHMelodye Ped [facility] 3244 10/21/20 Ashely Goosby BROWNING Performed at [facility], [place] [address], [place], [place] [address]    Culture STREPTOCOCCUS ANGINOSIS (Imogean Ciampa)  Final   Report  Status 10/24/2020 FINAL  Final   Organism ID, Bacteria STREPTOCOCCUS ANGINOSIS  Final      Susceptibility   Streptococcus anginosis - MIC*    PENICILLIN <=0.06 SENSITIVE Sensitive     CEFTRIAXONE 0.5 SENSITIVE Sensitive     ERYTHROMYCIN <=0.12 SENSITIVE Sensitive     LEVOFLOXACIN 0.5 SENSITIVE Sensitive     VANCOMYCIN 1 SENSITIVE Sensitive     * STREPTOCOCCUS ANGINOSIS  Blood Culture ID Panel (Reflexed)     Status: Abnormal   Collection Time: 10/20/20  7:56 AM  Result Value Ref Range Status   Enterococcus faecalis NOT DETECTED NOT DETECTED Final   Enterococcus Faecium NOT DETECTED NOT DETECTED Final   Listeria monocytogenes NOT DETECTED NOT DETECTED Final   Staphylococcus species NOT DETECTED NOT DETECTED Final   Staphylococcus aureus (BCID) NOT DETECTED NOT DETECTED Final   Staphylococcus epidermidis NOT DETECTED NOT DETECTED Final   Staphylococcus lugdunensis NOT DETECTED NOT DETECTED Final   Streptococcus species DETECTED (Hideo Googe) NOT DETECTED Final    Comment: Not Enterococcus species, Streptococcus agalactiae, Streptococcus pyogenes, or Streptococcus pneumoniae. CRITICAL RESULT CALLED TO, READ BACK BY AND VERIFIED WITH: Melodye Ped PHARMD 1316 10/21/20 Kipton Skillen BROWNING    Streptococcus agalactiae NOT DETECTED NOT DETECTED Final   Streptococcus pneumoniae NOT DETECTED NOT DETECTED Final   Streptococcus pyogenes NOT DETECTED NOT DETECTED Final   Karla Pavone.calcoaceticus-baumannii NOT DETECTED NOT DETECTED Final   Bacteroides fragilis NOT DETECTED NOT DETECTED Final   Enterobacterales NOT DETECTED NOT DETECTED Final   Enterobacter cloacae complex NOT DETECTED NOT DETECTED Final   Escherichia coli NOT DETECTED NOT DETECTED Final   Klebsiella aerogenes NOT DETECTED NOT DETECTED Final   Klebsiella oxytoca NOT DETECTED NOT DETECTED Final   Klebsiella pneumoniae NOT DETECTED NOT DETECTED Final   Proteus species NOT DETECTED NOT DETECTED Final   Salmonella species NOT DETECTED NOT DETECTED Final   Serratia  marcescens NOT DETECTED NOT DETECTED Final   Haemophilus influenzae NOT DETECTED NOT DETECTED Final   Neisseria meningitidis NOT DETECTED NOT DETECTED Final   Pseudomonas aeruginosa NOT DETECTED NOT DETECTED Final   Stenotrophomonas maltophilia NOT DETECTED NOT DETECTED Final   Candida albicans NOT DETECTED NOT DETECTED Final   Candida auris NOT DETECTED NOT DETECTED Final   Candida glabrata NOT DETECTED NOT DETECTED Final   Candida krusei NOT DETECTED NOT DETECTED Final   Candida parapsilosis NOT DETECTED NOT DETECTED Final   Candida tropicalis NOT DETECTED NOT DETECTED Final   Cryptococcus neoformans/gattii NOT DETECTED NOT DETECTED Final    Comment: Performed at [facility], [address], [place], [place] [address]  Culture, Urine     Status: Abnormal   Collection Time: 10/20/20 10:25 AM   Specimen: Urine, Clean Catch  Result Value Ref Range Status   Specimen Description   Final    URINE, CLEAN CATCH Performed at [facility], [place] [address] [place], [place] [address]    Special Requests   Final    NONE Performed at [facility], [name] [address], [place],  [address]    Culture >=100,000 COLONIES/mL ESCHERICHIA COLI (Dejae Bernet)  Final  Report Status 10/23/2020 FINAL  Final   Organism ID, Bacteria ESCHERICHIA COLI (Tabor Bartram)  Final      Susceptibility   Escherichia coli - MIC*    AMPICILLIN 4 SENSITIVE Sensitive     CEFAZOLIN <=4 SENSITIVE Sensitive     CEFEPIME <=0.12 SENSITIVE Sensitive     CEFTRIAXONE <=0.25 SENSITIVE Sensitive     CIPROFLOXACIN <=0.25 SENSITIVE Sensitive     GENTAMICIN <=1 SENSITIVE Sensitive     IMIPENEM <=0.25 SENSITIVE Sensitive     NITROFURANTOIN <=16 SENSITIVE Sensitive     TRIMETH/SULFA <=20 SENSITIVE Sensitive     AMPICILLIN/SULBACTAM <=2 SENSITIVE Sensitive     PIP/TAZO <=4 SENSITIVE Sensitive     * >=100,000 COLONIES/mL ESCHERICHIA COLI  Culture, blood (routine x 2)     Status: None (Preliminary result)    Collection Time: 10/22/20  7:26 AM   Specimen: BLOOD  Result Value Ref Range Status   Specimen Description   Final    BLOOD LEFT ANTECUBITAL Performed at [place] [address], [place], [place] [address]    Special Requests   Final    BOTTLES DRAWN AEROBIC ONLY Blood Culture results may not be optimal due to an inadequate volume of blood received in culture bottles Performed at [place] [address], [place], [place] [address]    Culture   Final    NO GROWTH 4 DAYS Performed at [facility], [place] [address], [place], [place] [address]    Report Status PENDING  Incomplete  Culture, blood (routine x 2)     Status: None (Preliminary result)   Collection Time: 10/22/20  7:26 AM   Specimen: BLOOD  Result Value Ref Range Status   Specimen Description   Final    BLOOD LEFT ANTECUBITAL Performed at [place] [address] [place], [place] [address]    Special Requests   Final    BOTTLES DRAWN AEROBIC ONLY Blood Culture results may not be optimal due to an inadequate volume of blood received in culture bottles Performed at [place] [address] [place], [place] [address]    Culture   Final    NO GROWTH 4 DAYS Performed at [facility], [place] [address] [place],  [address]    Report Status PENDING  Incomplete         Radiology Studies: DG C-Arm 1-60 Min-No Report  Result Date: 10/25/2020 Fluoroscopy was utilized by the requesting physician.  No radiographic interpretation.   DG HIP OPERATIVE UNILAT W OR W/O PELVIS RIGHT  Result Date: 10/25/2020 CLINICAL DATA:  IM nail right femur EXAM: OPERATIVE right HIP (WITH PELVIS IF PERFORMED) 4 VIEWS TECHNIQUE: Fluoroscopic spot image(s) were submitted for interpretation post-operatively. COMPARISON:  10/18/2020 FINDINGS: Four low resolution intraoperative spot views of the right hip. Total fluoroscopy time was 48  seconds. The images demonstrate intramedullary rod and distal screw fixation of right femur for comminuted intertrochanteric fracture. IMPRESSION: Intraoperative fluoroscopic assistance provided during surgical fixation of right proximal femoral fracture Electronically Signed   By: Donavan Foil M.D.   On: 10/25/2020 19:10        Scheduled Meds: . amLODipine  10 mg Oral Daily  . amoxicillin  1,000 mg Oral Q8H  . docusate sodium  100 mg Oral BID  . enoxaparin (LOVENOX) injection  40 mg Subcutaneous Q24H  . feeding supplement (GLUCERNA SHAKE)  237 mL Oral TID BM  . insulin aspart  0-15 Units  Subcutaneous TID WC  . insulin aspart  0-5 Units Subcutaneous QHS  . insulin aspart  3 Units Subcutaneous TID WC  . insulin detemir  15 Units Subcutaneous QHS  . insulin detemir  15 Units Subcutaneous Daily  . lipase/protease/amylase  24,000 Units Oral TID with meals  . pantoprazole  40 mg Oral Daily  . rosuvastatin  20 mg Oral Daily  . senna  1 tablet Oral BID  . traZODone  50 mg Oral QHS   Continuous Infusions: . methocarbamol (ROBAXIN) IV 500 mg (10/25/20 1951)     LOS: 8 days    Time spent: over 70 min    Fayrene Helper, MD [facility]   To contact the attending provider between 7A-7P or the covering provider during after hours 7P-7A, please log into the web site [email or web address] and access using universal [place] password for that web site. If you do not have the password, please call the hospital operator.  10/26/2020, 4:55 PM

## 2020-10-27 ENCOUNTER — Ambulatory Visit
Admit: 2020-10-27 | Discharge: 2020-10-27 | Disposition: A | Discharge status: Home / Self Care | Payer: Medicare Other | Attending: Radiation Oncology | Admitting: Radiation Oncology

## 2020-10-27 DIAGNOSIS — S72001A Fracture of unspecified part of neck of right femur, initial encounter for closed fracture: Secondary | ICD-10-CM | POA: Diagnosis not present

## 2020-10-27 LAB — CBC
HCT: 32.5 % — ABNORMAL LOW (ref 39.0–52.0)
Hemoglobin: 10.6 g/dL — ABNORMAL LOW (ref 13.0–17.0)
MCH: 30.1 pg (ref 26.0–34.0)
MCHC: 32.6 g/dL (ref 30.0–36.0)
MCV: 92.3 fL (ref 80.0–100.0)
Platelets: 541 10*3/uL — ABNORMAL HIGH (ref 150–400)
RBC: 3.52 MIL/uL — ABNORMAL LOW (ref 4.22–5.81)
RDW: 15.2 % (ref 11.5–15.5)
WBC: 15.3 10*3/uL — ABNORMAL HIGH (ref 4.0–10.5)
nRBC: 0 % (ref 0.0–0.2)

## 2020-10-27 LAB — BASIC METABOLIC PANEL
Anion gap: 9 (ref 5–15)
BUN: 28 mg/dL — ABNORMAL HIGH (ref 8–23)
CO2: 24 mmol/L (ref 22–32)
Calcium: 8.4 mg/dL — ABNORMAL LOW (ref 8.9–10.3)
Chloride: 102 mmol/L (ref 98–111)
Creatinine, Ser: 0.68 mg/dL (ref 0.61–1.24)
GFR, Estimated: >60 mL/min (ref >60)
Glucose, Bld: 265 mg/dL — ABNORMAL HIGH (ref 70–99)
Potassium: 4 mmol/L (ref 3.5–5.1)
Sodium: 135 mmol/L (ref 135–145)

## 2020-10-27 LAB — GLUCOSE, CAPILLARY
Glucose-Capillary: 217 mg/dL — ABNORMAL HIGH (ref 70–99)
Glucose-Capillary: 282 mg/dL — ABNORMAL HIGH (ref 70–99)
Glucose-Capillary: 183 mg/dL — ABNORMAL HIGH (ref 70–99)
Glucose-Capillary: 251 mg/dL — ABNORMAL HIGH (ref 70–99)

## 2020-10-27 LAB — CULTURE, BLOOD (ROUTINE X 2)
Culture: NO GROWTH
Culture: NO GROWTH

## 2020-10-27 LAB — SARS CORONAVIRUS 2 (TAT 6-24 HRS): SARS Coronavirus 2: NEGATIVE

## 2020-10-27 MED ORDER — INSULIN DETEMIR 100 UNIT/ML ~~LOC~~ SOLN
20.0000 [IU] | Freq: Every day | SUBCUTANEOUS | Status: DC
  Administered 2020-10-27: 20 [IU] via SUBCUTANEOUS
  Filled 2020-10-27: qty 0.2

## 2020-10-27 MED ORDER — INSULIN ASPART 100 UNIT/ML ~~LOC~~ SOLN
5.0000 [IU] | Freq: Three times a day (TID) | SUBCUTANEOUS | Status: DC
  Administered 2020-10-27 (×3): 5 [IU] via SUBCUTANEOUS

## 2020-10-27 MED ORDER — CHLORHEXIDINE GLUCONATE CLOTH 2 % EX PADS
6.0000 | MEDICATED_PAD | Freq: Every day | CUTANEOUS | Status: DC
  Administered 2020-10-27 – 2020-10-28 (×2): 6 via TOPICAL

## 2020-10-27 MED ORDER — TAMSULOSIN HCL 0.4 MG PO CAPS
0.4000 mg | ORAL_CAPSULE | Freq: Every day | ORAL | Status: DC
  Administered 2020-10-27 – 2020-10-28 (×2): 0.4 mg via ORAL
  Filled 2020-10-27 (×2): qty 1

## 2020-10-27 MED ORDER — APIXABAN 2.5 MG PO TABS: 2.5000 mg | ORAL_TABLET | Freq: Two times a day (BID) | ORAL | 0 refills | Status: DC

## 2020-10-27 MED ORDER — OXYCODONE HCL 5 MG PO TABS: 5.0000 mg | ORAL_TABLET | ORAL | 0 refills | Status: AC | PRN

## 2020-10-27 MED ORDER — INSULIN DETEMIR 100 UNIT/ML ~~LOC~~ SOLN
20.0000 [IU] | Freq: Every day | SUBCUTANEOUS | Status: DC
  Administered 2020-10-27 – 2020-10-28 (×2): 20 [IU] via SUBCUTANEOUS
  Filled 2020-10-27 (×2): qty 0.2

## 2020-10-27 NOTE — TOC Initial Note (Addendum)
Transition of Care [facility]) - Initial/Assessment Note   Patient Details  Name: Colton Bush MRN: [id number] Date of Birth: [birth year]  Transition of Care [facility]) CM/SW Contact:    Colton Don, LCSW Phone Number: 10/27/2020, 10:38 AM  Clinical Narrative: Patient is a 65 year old male who was admitted for closed right hip fracture. PT evaluation recommended SNF.  CSW met with patient and his wife, Colton Bush, to discuss SNF. Both are agreeable to SNF and would like [facility] as first choice. FL2 completed; PASRR received. Initial referral faxed out. TOC awaiting bed offers.  Addendum: Patient received bed offer from [facility] that patient and wife accepted. Hospitalist to order COVID test.  Expected Discharge Plan: Skilled Nursing Facility Barriers to Discharge: Continued Medical Work up  Patient Goals and CMS Choice Patient states their goals for this hospitalization and ongoing recovery are:: Go to SNF for short-term rehab CMS Medicare.gov Compare Post Acute Care list provided to:: Patient Choice offered to / list presented to : Patient  Expected Discharge Plan and Services Expected Discharge Plan: [place] In-house Referral: Clinical Social Work Discharge Planning Services: NA Post Acute Care Choice: [place] Living arrangements for the past 2 months: Single Family Home              DME Arranged: N/A DME Agency: NA  Prior Living Arrangements/Services Living arrangements for the past 2 months: Single Family Home Lives with:: Spouse Patient language and need for interpreter reviewed:: Yes Do you feel safe going back to the place where you live?: Yes      Need for Family Participation in Patient Care: No (Comment) Care giver support system in place?: Yes (comment) Criminal Activity/Legal Involvement Pertinent to Current Situation/Hospitalization: No - Comment as needed  Activities of Daily Living Home Assistive Devices/Equipment: None ADL  Screening (condition at time of admission) Patient's cognitive ability adequate to safely complete daily activities?: No Is the patient deaf or have difficulty hearing?: No Does the patient have difficulty seeing, even when wearing glasses/contacts?: No (reading glasses) Does the patient have difficulty concentrating, remembering, or making decisions?: No Patient able to express need for assistance with ADLs?: Yes Does the patient have difficulty dressing or bathing?: No Independently performs ADLs?: Yes (appropriate for developmental age) Does the patient have difficulty walking or climbing stairs?: No Weakness of Legs: None Weakness of Arms/Hands: None  Permission Sought/Granted Permission sought to share information with : Facility Art therapist granted to share information with : Yes, Verbal Permission Granted Permission granted to share info w AGENCY: SNFs  Emotional Assessment Appearance:: Appears stated age Attitude/Demeanor/Rapport: Engaged Affect (typically observed): Accepting Orientation: : Oriented to Self,Oriented to Place,Oriented to  Time,Oriented to Situation Alcohol / Substance Use: Not Applicable Psych Involvement: No (comment)  Admission diagnosis:  Closed fracture of right hip, initial encounter ([place]) [S72.001A] Closed right hip fracture, initial encounter [facility]) [S72.001A] Patient Active Problem List   Diagnosis Date Noted  . Closed right hip fracture, initial encounter ([place]) 10/18/2020  . Fall from ground level 10/18/2020  . Hypoglycemia 06/19/2017  . Hypothermia 06/19/2017  . Goals of care, counseling/discussion 09/29/2016  . Port catheter in place 04/11/2016  . Hypercalcemia 03/29/2016  . Adenocarcinoma of head of pancreas ([place]) 03/17/2016  . Biliary obstruction   . Obstructive jaundice due to malignant neoplasm ([place]) 02/27/2016  . DKA (diabetic ketoacidoses) 02/27/2016  . Type 2 diabetes mellitus with other specified complication  ([place]) 87/86/7672  . Cervical spondylosis with myelopathy 08/18/2011  .  CERUMEN IMPACTION 12/14/2008  . Hyperlipidemia, mixed 09/16/2007  . Essential hypertension 09/16/2007  . GERD 09/16/2007  . ESOPHAGEAL STRICTURE 04/04/2007  . HIATAL HERNIA 04/04/2007   PCP:  [name], MD Pharmacy:   [id number] [place] [address] [id number]: [phone]: [phone] [facility] [address] [place] Registered [facility] Sites [id number]: [phone]: [phone] Readmission Risk Interventions No flowsheet data found.

## 2020-10-27 NOTE — Progress Notes (Signed)
PROGRESS NOTE    ESAUL DORWART  XFG:[id number] DOB: 05/22/1956 DOA: 10/18/2020 PCP: [name], MD    Brief Narrative:  Colton Bush is a 65 year old male with past medical history significant for recurrent pancreatic cancer, type 2 diabetes mellitus, essential hypertension, hyperlipidemia who presented to the ED on 10/18/2020 following a fall with inability to ambulate.  Patient was in the grocery store parking lot, putting away groceries when he tried to make a quick reaction as this grocery cart was moving in which he lost his balance and fell onto his right side.  Patient was able to get up with assistance of bystanders but unable to place any weight on this right leg.  In the ED, afebrile, HR 83, RR 18, BP 140/87.  Sodium 135, potassium 3.7, chloride 100, sodium bicarbonate 25, glucose 296, BUN 19, creatinine 0.7.  WBC 23.6.  Hemoglobin 11.6.  Covid-19 PCR negative.  X-ray right hip with intratrochanteric fracture.  Patient was given IV morphine and Zofran.  Orthopedics consulted.  Hospital service consulted for further evaluation and management of acute hip fracture.   Assessment & Plan:   Principal Problem:   Closed right hip fracture, initial encounter [facility]) Active Problems:   Essential hypertension   Type 2 diabetes mellitus with other specified complication ([place])   Adenocarcinoma of head of pancreas ([place])   Fall from ground level   Closed right hip fracture Patient presenting to the ED following ground-level mechanical fall.  Imaging studies notable for intertrochanteric hip fracture on right.  Orthopedics was consulted and patient underwent ORIF/IM nail on 10/25/2020 by Dr. Lyla Glassing. --WBAT RLE --Lovenox for DVT prophylaxis (plan Eliquis on discharge per orthopedics) --Oxycodone 5 mg p.o. every 4 hours moderate pain --Morphine 1 mg IV every 4 hours as needed severe pain --Robaxin 500 mg p.o. every 6 hours as needed muscle spasms --pending SNF placement  Streptococcus  anginosus septicemia E. coli UTI Patient was found to have blood cultures 1/4 bottles positive for strep anginosis.  Infectious disease was consulted.  TEE with LVEF 96-78%, grade 1 diastolic dysfunction without evidence of valvular vegetations.  Patient was initially treated with ceftriaxone and completed therapy with amoxicillin for a total 7-day course.  Repeat blood cultures negative.  Urinary retention Patient with multiple episodes of urinary retention requiring in and out catheterization.  Foley catheter now placed on 10/27/2020.  Etiology likely secondary to poor ambulatory status with recent hip fracture. --Continue Foley catheter --Start tamsulosin 0.4 mg p.o. daily --Outpatient follow-up with urology  Pancreatic cancer, recurrent Follows with medical oncology, Dr. [name] outpatient.  Previous Whipple surgery.  Currently plan is radiation therapy outpatient with Dr. [name]. --Outpatient follow-up with medical and radiation oncology --Continue Creon  Essential hypertension Home regimen includes amlodipine 10 mg p.o. daily, benazepril 10 mg p.o. daily --BP 121/66 this morning, well controlled --Amlodipine 10 mg p.o. daily --Continue to hold home benazepril --Continue monitor blood pressure closely  Type 2 diabetes mellitus, with hyperglycemia Hemoglobin A1c 10.8, poorly controlled.  Home regimen includes Levemir 35 units qAm and 25u qPM, NovoLog sliding scale. --Increase Levemir to 20 units [place] BID --Increase Novolog to 5u TIDAC --moderate SSI for further coverage --We will continue to monitor glucose closely and adjust insulin regimen as needed  Hyperlipidemia: Crestor 20 mg p.o. daily  GERD: Continue PPI   DVT prophylaxis: Lovenox   Code Status: Full Code Family Communication: Spouse present at bedside this morning  Disposition Plan:  Level of care: Med-Surg Status is: Inpatient  Remains inpatient appropriate because:Unsafe d/c plan   Dispo: The patient is from:  Home              Anticipated d/c is to: SNF              Patient currently is medically stable to d/c.   Difficult to place patient No   Consultants:   Orthopedics, Dr. Lyla Glassing  Procedures:   ORIF/IM nail right hip, Dr. Lyla Glassing on 10/25/2020  Antimicrobials:   Cefazolin 3/23-3/24, 3/27-3/28  Ceftriaxone 3/24 -3/27  Amoxicillin 3/27-3/28  Anti-infectives (From admission, onward)   Start     Dose/Rate Route Frequency Ordered Stop   10/25/20 2300  ceFAZolin (ANCEF) IVPB 2g/100 mL premix        2 g 200 mL/hr over 30 Minutes Intravenous Every 6 hours 10/25/20 1912 10/26/20 0636   10/25/20 1704  ceFAZolin (ANCEF) 2-4 GM/100ML-% IVPB       Note to Pharmacy: [name]   : cabinet override      10/25/20 1704 10/26/20 0514   10/25/20 0900  amoxicillin (AMOXIL) capsule 1,000 mg        1,000 mg Oral Every 8 hours 10/25/20 0801 10/26/20 2221   10/25/20 0600  amoxicillin (AMOXIL) capsule 1,000 mg  Status:  Discontinued        1,000 mg Oral Every 8 hours 10/24/20 1355 10/25/20 0801   10/24/20 1059  ceFAZolin (ANCEF) 2-4 GM/100ML-% IVPB  Status:  Discontinued       Note to Pharmacy: [name]   : cabinet override      10/24/20 1059 10/24/20 1109   10/22/20 1000  cefTRIAXone (ROCEPHIN) 2 g in sodium chloride 0.9 % 100 mL IVPB  Status:  Discontinued        2 g 200 mL/hr over 30 Minutes Intravenous Every 24 hours 10/21/20 1343 10/24/20 1354   10/21/20 1400  cefTRIAXone (ROCEPHIN) 1 g in sodium chloride 0.9 % 100 mL IVPB        1 g 200 mL/hr over 30 Minutes Intravenous  Once 10/21/20 1343 10/21/20 1624   10/21/20 1330  ceFAZolin (ANCEF) IVPB 2g/100 mL premix  Status:  Discontinued        2 g 200 mL/hr over 30 Minutes Intravenous On call to O.R. 10/21/20 1323 10/21/20 1350   10/21/20 1330  ceFAZolin (ANCEF) IVPB 2g/100 mL premix  Status:  Discontinued        2 g 200 mL/hr over 30 Minutes Intravenous  Once 10/21/20 1324 10/21/20 1325   10/21/20 1300  ceFAZolin (ANCEF) 2-4  GM/100ML-% IVPB       Note to Pharmacy: [name]   : cabinet override      10/21/20 1300 10/22/20 0114   10/20/20 1230  cefTRIAXone (ROCEPHIN) 1 g in sodium chloride 0.9 % 100 mL IVPB  Status:  Discontinued        1 g 200 mL/hr over 30 Minutes Intravenous Every 24 hours 10/20/20 1141 10/21/20 1354   10/20/20 0600  ceFAZolin (ANCEF) IVPB 2g/100 mL premix        2 g 200 mL/hr over 30 Minutes Intravenous On call to O.R. 10/19/20 2233 10/21/20 0559        Subjective: Patient seen and examined at bedside, resting comfortably.  Spouse present.  Continues with poor ambulation, mild pain.  Wife concerned about him returning home and agrees for acute rehab placement.  No other questions or concerns at this time.  Patient denies headache, no visual changes, no  fever/chills/night sweats, no nausea/vomiting/diarrhea, no chest pain, no palpitations, no shortness of breath, no abdominal pain, no weakness, no fatigue, no paresthesias.  No acute events overnight per nursing staff.  Objective: Vitals:   10/26/20 2223 10/27/20 0130 10/27/20 0537 10/27/20 1312  BP: 134/71 128/61 121/66 (!) 113/59  Pulse: 90 70 70 81  Resp: 16 17 16 18   Temp: 98 F (36.7 C) 98.6 F (37 C) 98.3 F (36.8 C) (!) 97.4 F (36.3 C)  TempSrc: Oral Oral Oral Oral  SpO2: 98% 93% 94% 94%  Weight:      Height:        Intake/Output Summary (Last 24 hours) at 10/27/2020 1319 Last data filed at 10/27/2020 1100 Gross per 24 hour  Intake 720 ml  Output 3050 ml  Net -2330 ml   Filed Weights   10/18/20 2330 10/21/20 1326 10/25/20 1611  Weight: 88.9 kg 88.9 kg 88.9 kg    Examination:  General exam: Appears calm and comfortable  Respiratory system: Clear to auscultation. Respiratory effort normal. Cardiovascular system: S1 & S2 heard, RRR. No JVD, murmurs, rubs, gallops or clicks. No pedal edema. Gastrointestinal system: Abdomen is nondistended, soft and nontender. No organomegaly or masses felt. Normal bowel sounds  heard. Central nervous system: Alert and oriented. No focal neurological deficits. Extremities: Symmetric 5 x 5 power. Skin: No rashes, lesions or ulcers Psychiatry: Judgement and insight appear normal. Mood & affect appropriate.     Data Reviewed: I have personally reviewed following labs and imaging studies  CBC: Recent Labs  Lab 10/22/20 0307 10/23/20 0321 10/24/20 0251 10/25/20 0316 10/26/20 0330 10/27/20 0327  WBC 13.6* 12.5* 12.7* 14.7* 19.2* 15.3*  NEUTROABS 10.9* 9.5* 10.2* 12.0* 18.2*  --   HGB 10.3* 10.4* 10.7* 10.3* 9.6* 10.6*  HCT 31.5* 32.0* 32.4* 31.3* 29.4* 32.5*  MCV 90.0 91.7 90.5 92.3 91.9 92.3  PLT 446* 511* 554* 536* 494* 568*   Basic Metabolic Panel: Recent Labs  Lab 10/21/20 0305 10/22/20 0307 10/23/20 0321 10/24/20 0251 10/25/20 0316 10/26/20 0330 10/27/20 0327  NA 133* 133* 134* 134* 132* 132* 135  K 3.4* 3.6 3.6 4.3 4.2 4.7 4.0  CL 102 103 103 103 102 101 102  CO2 24 23 23 24 24 24 24   GLUCOSE 217* 189* 195* 336* 312* 347* 265*  BUN 19 15 18 22 20  24* 28*  CREATININE 0.78 0.76 0.66 0.72 0.62 0.64 0.68  CALCIUM 7.5* 7.7* 7.9* 7.9* 8.0* 8.3* 8.4*  MG 2.2 2.0 2.2 2.1 2.0  --   --   PHOS 2.6 2.7 2.8 2.8 3.1 4.1  --    GFR: Estimated Creatinine Clearance: 100.6 mL/min (by C-G formula based on SCr of 0.68 mg/dL). Liver Function Tests: Recent Labs  Lab 10/22/20 0307 10/23/20 0321 10/24/20 0251 10/25/20 0316 10/26/20 0330  AST 22 16 18  12* 17  ALT 25 21 21 18 21   ALKPHOS 210* 198* 255* 218* 211*  BILITOT 0.8 0.8 0.6 0.5 0.8  PROT 7.0 7.0 7.0 6.9 7.2  ALBUMIN 2.2* 2.2* 2.1* 2.1* 2.3*   No results for input(s): LIPASE, AMYLASE in the last 168 hours. No results for input(s): AMMONIA in the last 168 hours. Coagulation Profile: Recent Labs  Lab 10/24/20 0814  INR 1.2   Cardiac Enzymes: No results for input(s): CKTOTAL, CKMB, CKMBINDEX, TROPONINI in the last 168 hours. BNP (last 3 results) No results for input(s): PROBNP in the  last 8760 hours. HbA1C: No results for input(s): HGBA1C in the last 72 hours.  CBG: Recent Labs  Lab 10/26/20 1114 10/26/20 1644 10/26/20 2103 10/27/20 0750 10/27/20 1136  GLUCAP 355* 343* 282* 217* 282*   Lipid Profile: No results for input(s): CHOL, HDL, LDLCALC, TRIG, CHOLHDL, LDLDIRECT in the last 72 hours. Thyroid Function Tests: No results for input(s): TSH, T4TOTAL, FREET4, T3FREE, THYROIDAB in the last 72 hours. Anemia Panel: No results for input(s): VITAMINB12, FOLATE, FERRITIN, TIBC, IRON, RETICCTPCT in the last 72 hours. Sepsis Labs: No results for input(s): PROCALCITON, LATICACIDVEN in the last 168 hours.  Recent Results (from the past 240 hour(s))  Resp Panel by RT-PCR (Flu A&B, Covid) Nasopharyngeal Swab     Status: None   Collection Time: 10/18/20  7:11 PM   Specimen: Nasopharyngeal Swab; Nasopharyngeal(NP) swabs in vial transport medium  Result Value Ref Range Status   SARS Coronavirus 2 by RT PCR NEGATIVE NEGATIVE Final    Comment: (NOTE) SARS-CoV-2 target nucleic acids are NOT DETECTED.  The SARS-CoV-2 RNA is generally detectable in upper respiratory specimens during the acute phase of infection. The lowest concentration of SARS-CoV-2 viral copies this assay can detect is 138 copies/mL. A negative result does not preclude SARS-Cov-2 infection and should not be used as the sole basis for treatment or other patient management decisions. A negative result may occur with  improper specimen collection/handling, submission of specimen other than nasopharyngeal swab, presence of viral mutation(s) within the areas targeted by this assay, and inadequate number of viral copies(<138 copies/mL). A negative result must be combined with clinical observations, patient history, and epidemiological information. The expected result is Negative.  Fact Sheet for Patients:  EntrepreneurPulse.com.au  Fact Sheet for Healthcare Providers:   IncredibleEmployment.be  This test is no t yet approved or cleared by the [place] FDA and  has been authorized for detection and/or diagnosis of SARS-CoV-2 by FDA under an Emergency Use Authorization (EUA). This EUA will remain  in effect (meaning this test can be used) for the duration of the COVID-19 declaration under Section 564(b)(1) of the Act, 21 U.S.C.section 360bbb-3(b)(1), unless the authorization is terminated  or revoked sooner.       Influenza A by PCR NEGATIVE NEGATIVE Final   Influenza B by PCR NEGATIVE NEGATIVE Final    Comment: (NOTE) The Xpert Xpress SARS-CoV-2/FLU/RSV plus assay is intended as an aid in the diagnosis of influenza from Nasopharyngeal swab specimens and should not be used as a sole basis for treatment. Nasal washings and aspirates are unacceptable for Xpert Xpress SARS-CoV-2/FLU/RSV testing.  Fact Sheet for Patients: EntrepreneurPulse.com.au  Fact Sheet for Healthcare Providers: IncredibleEmployment.be  This test is not yet approved or cleared by the [place] FDA and has been authorized for detection and/or diagnosis of SARS-CoV-2 by FDA under an Emergency Use Authorization (EUA). This EUA will remain in effect (meaning this test can be used) for the duration of the COVID-19 declaration under Section 564(b)(1) of the Act, 21 U.S.C. section 360bbb-3(b)(1), unless the authorization is terminated or revoked.  Performed at [facility], [place] [address], [place], [place] [address]   Surgical pcr screen     Status: Abnormal   Collection Time: 10/19/20 12:47 AM   Specimen: Nasal Mucosa; Nasal Swab  Result Value Ref Range Status   MRSA, PCR NEGATIVE NEGATIVE Final   Staphylococcus aureus POSITIVE (A) NEGATIVE Final    Comment: RESULT CALLED TO, READ BACK BY AND VERIFIED WITH: BOBBY, RN @ [phone] ON 10/19/20 C VARNER (NOTE) The Xpert SA Assay (FDA approved for NASAL  specimens in  patients 67 years of age and older), is one component of a comprehensive surveillance program. It is not intended to diagnose infection nor to guide or monitor treatment. Performed at [facility], [place] [address] [place], [place] [address]   Culture, blood (routine x 2)     Status: None   Collection Time: 10/20/20  7:55 AM   Specimen: BLOOD  Result Value Ref Range Status   Specimen Description   Final    BLOOD RIGHT ANTECUBITAL Performed at [place] [address] [place], [place] [address]    Special Requests   Final    BOTTLES DRAWN AEROBIC AND ANAEROBIC Blood Culture results may not be optimal due to an inadequate volume of blood received in culture bottles Performed at [place] [address] [place], [place] [address]    Culture   Final    NO GROWTH 5 DAYS Performed at [facility], [place] [address] [place], [place] [address]    Report Status 10/25/2020 FINAL  Final  Culture, blood (routine x 2)     Status: Abnormal   Collection Time: 10/20/20  7:56 AM   Specimen: BLOOD  Result Value Ref Range Status   Specimen Description   Final    BLOOD RIGHT ANTECUBITAL Performed at [place] [address], [place], [place] [address]    Special Requests   Final    BOTTLES DRAWN AEROBIC AND ANAEROBIC Blood Culture results may not be optimal due to an inadequate volume of blood received in culture bottles Performed at [place] [address] [place], [place] [address]    Culture  Setup Time   Final    AEROBIC BOTTLE ONLY GRAM POSITIVE COCCI CRITICAL RESULT CALLED TO, READ BACK BY AND VERIFIED WITHMelodye Ped [facility] 0240 10/21/20 A BROWNING Performed at [facility], [place] [address], [place],  [address]    Culture STREPTOCOCCUS ANGINOSIS (A)  Final   Report Status 10/24/2020 FINAL  Final   Organism ID, Bacteria STREPTOCOCCUS ANGINOSIS   Final      Susceptibility   Streptococcus anginosis - MIC*    PENICILLIN <=0.06 SENSITIVE Sensitive     CEFTRIAXONE 0.5 SENSITIVE Sensitive     ERYTHROMYCIN <=0.12 SENSITIVE Sensitive     LEVOFLOXACIN 0.5 SENSITIVE Sensitive     VANCOMYCIN 1 SENSITIVE Sensitive     * STREPTOCOCCUS ANGINOSIS  Blood Culture ID Panel (Reflexed)     Status: Abnormal   Collection Time: 10/20/20  7:56 AM  Result Value Ref Range Status   Enterococcus faecalis NOT DETECTED NOT DETECTED Final   Enterococcus Faecium NOT DETECTED NOT DETECTED Final   Listeria monocytogenes NOT DETECTED NOT DETECTED Final   Staphylococcus species NOT DETECTED NOT DETECTED Final   Staphylococcus aureus (BCID) NOT DETECTED NOT DETECTED Final   Staphylococcus epidermidis NOT DETECTED NOT DETECTED Final   Staphylococcus lugdunensis NOT DETECTED NOT DETECTED Final   Streptococcus species DETECTED (A) NOT DETECTED Final    Comment: Not Enterococcus species, Streptococcus agalactiae, Streptococcus pyogenes, or Streptococcus pneumoniae. CRITICAL RESULT CALLED TO, READ BACK BY AND VERIFIED WITH: Melodye Ped PHARMD 1316 10/21/20 A BROWNING    Streptococcus agalactiae NOT DETECTED NOT DETECTED Final   Streptococcus pneumoniae NOT DETECTED NOT DETECTED Final   Streptococcus pyogenes NOT DETECTED NOT DETECTED Final   A.calcoaceticus-baumannii NOT DETECTED NOT DETECTED Final   Bacteroides fragilis NOT DETECTED NOT DETECTED Final   Enterobacterales NOT DETECTED NOT DETECTED  Final   Enterobacter cloacae complex NOT DETECTED NOT DETECTED Final   Escherichia coli NOT DETECTED NOT DETECTED Final   Klebsiella aerogenes NOT DETECTED NOT DETECTED Final   Klebsiella oxytoca NOT DETECTED NOT DETECTED Final   Klebsiella pneumoniae NOT DETECTED NOT DETECTED Final   Proteus species NOT DETECTED NOT DETECTED Final   Salmonella species NOT DETECTED NOT DETECTED Final   Serratia marcescens NOT DETECTED NOT DETECTED Final   Haemophilus influenzae NOT  DETECTED NOT DETECTED Final   Neisseria meningitidis NOT DETECTED NOT DETECTED Final   Pseudomonas aeruginosa NOT DETECTED NOT DETECTED Final   Stenotrophomonas maltophilia NOT DETECTED NOT DETECTED Final   Candida albicans NOT DETECTED NOT DETECTED Final   Candida auris NOT DETECTED NOT DETECTED Final   Candida glabrata NOT DETECTED NOT DETECTED Final   Candida krusei NOT DETECTED NOT DETECTED Final   Candida parapsilosis NOT DETECTED NOT DETECTED Final   Candida tropicalis NOT DETECTED NOT DETECTED Final   Cryptococcus neoformans/gattii NOT DETECTED NOT DETECTED Final    Comment: Performed at [facility], [place] [address] [place], [place] [address]  Culture, Urine     Status: Abnormal   Collection Time: 10/20/20 10:25 AM   Specimen: Urine, Clean Catch  Result Value Ref Range Status   Specimen Description   Final    URINE, CLEAN CATCH Performed at [facility], [place] [address], [place], [place] [address]    Special Requests   Final    NONE Performed at [facility], [place] [address], [place], [place] [address]    Culture >=100,000 COLONIES/mL ESCHERICHIA COLI (A)  Final   Report Status 10/23/2020 FINAL  Final   Organism ID, Bacteria ESCHERICHIA COLI (A)  Final      Susceptibility   Escherichia coli - MIC*    AMPICILLIN 4 SENSITIVE Sensitive     CEFAZOLIN <=4 SENSITIVE Sensitive     CEFEPIME <=0.12 SENSITIVE Sensitive     CEFTRIAXONE <=0.25 SENSITIVE Sensitive     CIPROFLOXACIN <=0.25 SENSITIVE Sensitive     GENTAMICIN <=1 SENSITIVE Sensitive     IMIPENEM <=0.25 SENSITIVE Sensitive     NITROFURANTOIN <=16 SENSITIVE Sensitive     TRIMETH/SULFA <=20 SENSITIVE Sensitive     AMPICILLIN/SULBACTAM <=2 SENSITIVE Sensitive     PIP/TAZO <=4 SENSITIVE Sensitive     * >=100,000 COLONIES/mL ESCHERICHIA COLI  Culture, blood (routine x 2)     Status: None   Collection Time: 10/22/20  7:26 AM   Specimen: BLOOD  Result Value Ref Range Status    Specimen Description   Final    BLOOD LEFT ANTECUBITAL Performed at [facility] [address] [place], [place] [address]    Special Requests   Final    BOTTLES DRAWN AEROBIC ONLY Blood Culture results may not be optimal due to an inadequate volume of blood received in culture bottles Performed at [place] [address] [place], [place] [address]    Culture   Final    NO GROWTH 5 DAYS Performed at [facility], [place] [address], [place], [place] [address]    Report Status 10/27/2020 FINAL  Final  Culture, blood (routine x 2)     Status: None   Collection Time: 10/22/20  7:26 AM   Specimen: BLOOD  Result Value Ref Range Status   Specimen Description   Final    BLOOD LEFT ANTECUBITAL Performed at [place] [address] [place],  [place] [address]    Special Requests   Final    BOTTLES DRAWN AEROBIC ONLY Blood Culture results may not be optimal due to an inadequate volume of blood received in culture bottles Performed at [facility] [address] [place], [place] [address]    Culture   Final    NO GROWTH 5 DAYS Performed at [facility], [place] [address], [place], [place] [address]    Report Status 10/27/2020 FINAL  Final         Radiology Studies: DG C-Arm 1-60 Min-No Report  Result Date: 10/25/2020 Fluoroscopy was utilized by the requesting physician.  No radiographic interpretation.   DG HIP OPERATIVE UNILAT W OR W/O PELVIS RIGHT  Result Date: 10/25/2020 CLINICAL DATA:  IM nail right femur EXAM: OPERATIVE right HIP (WITH PELVIS IF PERFORMED) 4 VIEWS TECHNIQUE: Fluoroscopic spot image(s) were submitted for interpretation post-operatively. COMPARISON:  10/18/2020 FINDINGS: Four low resolution intraoperative spot views of the right hip. Total fluoroscopy time was 48 seconds. The images demonstrate intramedullary rod and distal screw fixation of right femur for comminuted  intertrochanteric fracture. IMPRESSION: Intraoperative fluoroscopic assistance provided during surgical fixation of right proximal femoral fracture Electronically Signed   By: Donavan Foil M.D.   On: 10/25/2020 19:10        Scheduled Meds: . amLODipine  10 mg Oral Daily  . Chlorhexidine Gluconate Cloth  6 each Topical Daily  . docusate sodium  100 mg Oral BID  . enoxaparin (LOVENOX) injection  40 mg Subcutaneous Q24H  . feeding supplement (GLUCERNA SHAKE)  237 mL Oral TID BM  . insulin aspart  0-15 Units Subcutaneous TID WC  . insulin aspart  0-5 Units Subcutaneous QHS  . insulin aspart  5 Units Subcutaneous TID WC  . insulin detemir  20 Units Subcutaneous QHS  . insulin detemir  20 Units Subcutaneous Daily  . lipase/protease/amylase  24,000 Units Oral TID with meals  . pantoprazole  40 mg Oral Daily  . polyethylene glycol  17 g Oral BID  . rosuvastatin  20 mg Oral Daily  . senna  1 tablet Oral BID  . tamsulosin  0.4 mg Oral Daily  . traZODone  50 mg Oral QHS   Continuous Infusions: . methocarbamol (ROBAXIN) IV 500 mg (10/25/20 1951)     LOS: 9 days    Time spent: 35 minutes spent on chart review, discussion with nursing staff, consultants, updating family and interview/physical exam; more than 50% of that time was spent in counseling and/or coordination of care.    Dionna Wiedemann J [place] DO [facility] Available via Epic secure chat 7am-7pm After these hours, please refer to coverage provider listed on [email or web address] 10/27/2020, 1:19 PM

## 2020-10-27 NOTE — Plan of Care (Signed)
Completed a bladder scan and straight cath with 911mL output. Still no urge to void.  Problem: Elimination: Goal: Will not experience complications related to urinary retention 10/27/2020 0444 by Foster Simpson, Student-RN Outcome: Not Progressing

## 2020-10-27 NOTE — NC FL2 (Signed)
[place] LEVEL OF CARE SCREENING TOOL     IDENTIFICATION  Patient Name: Colton Bush Birthdate: 03-11-1956 Sex: male Admission Date (Current Location): 10/18/2020  [facility] and [place] Number:  Herbalist and Address:  [facility]  [place], [place]      Provider Number: [id number]  Attending Physician Name and Address:  [place] Eric J, DO  Relative Name and Phone Number:  Vasilios Ottaway (wife) Ph: [phone]    Current Level of Care: Hospital Recommended Level of Care: [place] Prior Approval Number:    Date Approved/Denied:   PASRR Number: [id number]  Discharge Plan: SNF    Current Diagnoses: Patient Active Problem List   Diagnosis Date Noted  . Closed right hip fracture, initial encounter ([place]) 10/18/2020  . Fall from ground level 10/18/2020  . Hypoglycemia 06/19/2017  . Hypothermia 06/19/2017  . Goals of care, counseling/discussion 09/29/2016  . Port catheter in place 04/11/2016  . Hypercalcemia 03/29/2016  . Adenocarcinoma of head of pancreas ([place]) 03/17/2016  . Biliary obstruction   . Obstructive jaundice due to malignant neoplasm ([place]) 02/27/2016  . DKA (diabetic ketoacidoses) 02/27/2016  . Type 2 diabetes mellitus with other specified complication ([place]) 90/24/0973  . Cervical spondylosis with myelopathy 08/18/2011  . CERUMEN IMPACTION 12/14/2008  . Hyperlipidemia, mixed 09/16/2007  . Essential hypertension 09/16/2007  . GERD 09/16/2007  . ESOPHAGEAL STRICTURE 04/04/2007  . HIATAL HERNIA 04/04/2007    Orientation RESPIRATION BLADDER Height & Weight     Self,Time,Situation,Place  Normal Continent Weight: 195 lb 15.8 oz (88.9 kg) Height:  5\' 10"  (177.8 cm)  BEHAVIORAL SYMPTOMS/MOOD NEUROLOGICAL BOWEL NUTRITION STATUS      Continent Diet (Carb modified)  AMBULATORY STATUS COMMUNICATION OF NEEDS Skin   Extensive Assist Verbally Skin abrasions,Surgical wounds (Abrasion: right elbow;  surgical incision: right hip)                       Personal Care Assistance Level of Assistance  Bathing,Feeding,Dressing Bathing Assistance: Maximum assistance Feeding assistance: Independent Dressing Assistance: Maximum assistance     Functional Limitations Info  Sight,Hearing,Speech Sight Info: Adequate Hearing Info: Adequate Speech Info: Adequate    SPECIAL CARE FACTORS FREQUENCY  PT (By licensed PT)     PT Frequency: 5x's/week              Contractures Contractures Info: Not present    Additional Factors Info  Code Status,Allergies,Psychotropic,Insulin Sliding Scale Code Status Info: Full Allergies Info: NKA Psychotropic Info: Desyrel (trazadone) Insulin Sliding Scale Info: See discharge summary       Current Medications (10/27/2020):  This is the current hospital active medication list Current Facility-Administered Medications  Medication Dose Route Frequency Provider Last Rate Last Admin  . albuterol (PROVENTIL) (2.5 MG/3ML) 0.083% nebulizer solution 2.5 mg  2.5 mg Nebulization Q6H PRN Swinteck, Aaron Edelman, MD      . amLODipine (NORVASC) tablet 10 mg  10 mg Oral Daily Rod Can, MD   10 mg at 10/27/20 0826  . Chlorhexidine Gluconate Cloth 2 % PADS 6 each  6 each Topical Daily [place] Donnamarie Poag, DO      . docusate sodium (COLACE) capsule 100 mg  100 mg Oral BID Rod Can, MD   100 mg at 10/27/20 0826  . enoxaparin (LOVENOX) injection 40 mg  40 mg Subcutaneous Q24H Rod Can, MD   40 mg at 10/27/20 0827  . feeding supplement (GLUCERNA SHAKE) (GLUCERNA SHAKE) liquid 237 mL  237 mL Oral TID BM Rod Can, MD   237 mL at 10/26/20 2049  . insulin aspart (novoLOG) injection 0-15 Units  0-15 Units Subcutaneous TID WC Elodia Florence., MD   5 Units at 10/27/20 [phone]  . insulin aspart (novoLOG) injection 0-5 Units  0-5 Units Subcutaneous QHS Elodia Florence., MD   3 Units at 10/26/20 2223  . insulin aspart (novoLOG) injection 5 Units  5  Units Subcutaneous TID WC [place] Eric J, DO   5 Units at 10/27/20 6213  . insulin detemir (LEVEMIR) injection 20 Units  20 Units Subcutaneous QHS [place] Eric J, DO      . insulin detemir (LEVEMIR) injection 20 Units  20 Units Subcutaneous Daily [place] Eric J, DO      . lipase/protease/amylase (CREON) capsule 24,000 Units  24,000 Units Oral TID with meals Rod Can, MD   24,000 Units at 10/27/20 [id number]  . menthol-cetylpyridinium (CEPACOL) lozenge 3 mg  1 lozenge Oral PRN Swinteck, Aaron Edelman, MD       Or  . phenol (CHLORASEPTIC) mouth spray 1 spray  1 spray Mouth/Throat PRN Swinteck, Aaron Edelman, MD      . methocarbamol (ROBAXIN) tablet 500 mg  500 mg Oral Q6H PRN Swinteck, Aaron Edelman, MD       Or  . methocarbamol (ROBAXIN) 500 mg in dextrose 5 % 50 mL IVPB  500 mg Intravenous Q6H PRN Rod Can, MD 100 mL/hr at 10/25/20 1951 500 mg at 10/25/20 1951  . metoCLOPramide (REGLAN) tablet 5-10 mg  5-10 mg Oral Q8H PRN Swinteck, Aaron Edelman, MD       Or  . metoCLOPramide (REGLAN) injection 5-10 mg  5-10 mg Intravenous Q8H PRN Swinteck, Aaron Edelman, MD      . morphine 2 MG/ML injection 1 mg  1 mg Intravenous Q4H PRN Rod Can, MD   1 mg at 10/24/20 0831  . ondansetron (ZOFRAN) tablet 4 mg  4 mg Oral Q6H PRN Swinteck, Aaron Edelman, MD       Or  . ondansetron (ZOFRAN) injection 4 mg  4 mg Intravenous Q6H PRN Swinteck, Aaron Edelman, MD      . oxyCODONE (Oxy IR/ROXICODONE) immediate release tablet 5 mg  5 mg Oral Q4H PRN Rod Can, MD   5 mg at 10/27/20 0826  . pantoprazole (PROTONIX) EC tablet 40 mg  40 mg Oral Daily Rod Can, MD   40 mg at 10/27/20 0826  . polyethylene glycol (MIRALAX / GLYCOLAX) packet 17 g  17 g Oral BID Elodia Florence., MD   17 g at 10/27/20 0827  . rosuvastatin (CRESTOR) tablet 20 mg  20 mg Oral Daily Rod Can, MD   20 mg at 10/27/20 0826  . senna (SENOKOT) tablet 8.6 mg  1 tablet Oral BID Rod Can, MD   8.6 mg at 10/27/20 0826  . tamsulosin (FLOMAX) capsule 0.4 mg  0.4  mg Oral Daily [place] Eric J, DO      . traZODone (DESYREL) tablet 50 mg  50 mg Oral QHS Rod Can, MD   50 mg at 10/26/20 2221   Facility-Administered Medications Ordered in Other Encounters  Medication Dose Route Frequency Provider Last Rate Last Admin  . sodium chloride 0.9 % injection 10 mL  10 mL Intravenous PRN Truitt Merle, MD   10 mL at 02/09/17 1111     Discharge Medications: Please see discharge summary for a list of discharge medications.  Relevant Imaging Results:  Relevant Lab Results:   Additional Information SSN: [id number]  Sherie Don, LCSW

## 2020-10-27 NOTE — Progress Notes (Signed)
    Subjective:  Patient reports pain as mild to moderate.  Denies N/V/CP/SOB. Patient states he is working hard in order to progress with therapy in order to discharge home.   Objective:   VITALS:   Vitals:   10/26/20 1349 10/26/20 2223 10/27/20 0130 10/27/20 0537  BP: 110/65 134/71 128/61 121/66  Pulse: 98 90 70 70  Resp: 18 16 17 16   Temp: 98 F (36.7 C) 98 F (36.7 C) 98.6 F (37 C) 98.3 F (36.8 C)  TempSrc: Oral Oral Oral Oral  SpO2: 93% 98% 93% 94%  Weight:      Height:        NAD ABD soft Neurovascular intact Sensation intact distally Intact pulses distally Dorsiflexion/Plantar flexion intact Incision: dressing C/D/I   Lab Results  Component Value Date   WBC 15.3 (H) 10/27/2020   HGB 10.6 (L) 10/27/2020   HCT 32.5 (L) 10/27/2020   MCV 92.3 10/27/2020   PLT 541 (H) 10/27/2020   BMET    Component Value Date/Time   NA 135 10/27/2020 0327   NA 143 06/08/2017 0952   K 4.0 10/27/2020 0327   K 4.6 06/08/2017 0952   CL 102 10/27/2020 0327   CO2 24 10/27/2020 0327   CO2 28 06/08/2017 0952   GLUCOSE 265 (H) 10/27/2020 0327   GLUCOSE 58 (L) 06/08/2017 0952   BUN 28 (H) 10/27/2020 0327   BUN 18.0 06/08/2017 0952   CREATININE 0.68 10/27/2020 0327   CREATININE 0.71 09/04/2019 0729   CREATININE 0.7 06/08/2017 0952   CALCIUM 8.4 (L) 10/27/2020 0327   CALCIUM 10.0 06/08/2017 0952   GFRNONAA >60 10/27/2020 0327   GFRNONAA >60 09/04/2019 0729   GFRAA >60 04/14/2020 0925   GFRAA >60 09/04/2019 0729     Assessment/Plan: 2 Days Post-Op   Principal Problem:   Closed right hip fracture, initial encounter [facility]) Active Problems:   Essential hypertension   Type 2 diabetes mellitus with other specified complication (HCC)   Adenocarcinoma of head of pancreas ([place])   Fall from ground level   WBAT with walker DVT ppx: Lovenox, SCDs, TEDS    D/C with apixaban PO pain control PT/OT Dispo: Pending     Dorothyann Peng 10/27/2020, 8:22 AM  [facility]  Orthopaedics is now [address], [place], [place] [address] Phone: [phone] [email or web address] Facebook  [employer]

## 2020-10-28 DIAGNOSIS — S72001A Fracture of unspecified part of neck of right femur, initial encounter for closed fracture: Secondary | ICD-10-CM | POA: Diagnosis not present

## 2020-10-28 DIAGNOSIS — N138 Other obstructive and reflux uropathy: Secondary | ICD-10-CM | POA: Diagnosis not present

## 2020-10-28 DIAGNOSIS — L89152 Pressure ulcer of sacral region, stage 2: Secondary | ICD-10-CM | POA: Diagnosis not present

## 2020-10-28 DIAGNOSIS — M25651 Stiffness of right hip, not elsewhere classified: Secondary | ICD-10-CM | POA: Diagnosis not present

## 2020-10-28 DIAGNOSIS — B955 Unspecified streptococcus as the cause of diseases classified elsewhere: Secondary | ICD-10-CM | POA: Diagnosis not present

## 2020-10-28 DIAGNOSIS — I10 Essential (primary) hypertension: Secondary | ICD-10-CM | POA: Diagnosis not present

## 2020-10-28 DIAGNOSIS — R269 Unspecified abnormalities of gait and mobility: Secondary | ICD-10-CM | POA: Diagnosis not present

## 2020-10-28 DIAGNOSIS — R4701 Aphasia: Secondary | ICD-10-CM | POA: Diagnosis present

## 2020-10-28 DIAGNOSIS — W19XXXA Unspecified fall, initial encounter: Secondary | ICD-10-CM | POA: Diagnosis not present

## 2020-10-28 DIAGNOSIS — M6281 Muscle weakness (generalized): Secondary | ICD-10-CM | POA: Diagnosis not present

## 2020-10-28 DIAGNOSIS — K8689 Other specified diseases of pancreas: Secondary | ICD-10-CM | POA: Diagnosis not present

## 2020-10-28 DIAGNOSIS — R339 Retention of urine, unspecified: Secondary | ICD-10-CM | POA: Diagnosis not present

## 2020-10-28 DIAGNOSIS — K219 Gastro-esophageal reflux disease without esophagitis: Secondary | ICD-10-CM | POA: Diagnosis not present

## 2020-10-28 DIAGNOSIS — Z7401 Bed confinement status: Secondary | ICD-10-CM | POA: Diagnosis not present

## 2020-10-28 DIAGNOSIS — R7881 Bacteremia: Secondary | ICD-10-CM | POA: Diagnosis not present

## 2020-10-28 DIAGNOSIS — B954 Other streptococcus as the cause of diseases classified elsewhere: Secondary | ICD-10-CM | POA: Diagnosis not present

## 2020-10-28 DIAGNOSIS — G4089 Other seizures: Secondary | ICD-10-CM | POA: Diagnosis not present

## 2020-10-28 DIAGNOSIS — E782 Mixed hyperlipidemia: Secondary | ICD-10-CM | POA: Diagnosis not present

## 2020-10-28 DIAGNOSIS — K750 Abscess of liver: Secondary | ICD-10-CM | POA: Diagnosis not present

## 2020-10-28 DIAGNOSIS — N320 Bladder-neck obstruction: Secondary | ICD-10-CM | POA: Diagnosis not present

## 2020-10-28 DIAGNOSIS — C250 Malignant neoplasm of head of pancreas: Secondary | ICD-10-CM | POA: Diagnosis not present

## 2020-10-28 DIAGNOSIS — L0291 Cutaneous abscess, unspecified: Secondary | ICD-10-CM | POA: Diagnosis not present

## 2020-10-28 DIAGNOSIS — Z4789 Encounter for other orthopedic aftercare: Secondary | ICD-10-CM | POA: Diagnosis not present

## 2020-10-28 DIAGNOSIS — I110 Hypertensive heart disease with heart failure: Secondary | ICD-10-CM | POA: Diagnosis present

## 2020-10-28 DIAGNOSIS — S065X9A Traumatic subdural hemorrhage with loss of consciousness of unspecified duration, initial encounter: Secondary | ICD-10-CM | POA: Diagnosis not present

## 2020-10-28 DIAGNOSIS — Z794 Long term (current) use of insulin: Secondary | ICD-10-CM | POA: Diagnosis not present

## 2020-10-28 DIAGNOSIS — E872 Acidosis: Secondary | ICD-10-CM | POA: Diagnosis not present

## 2020-10-28 DIAGNOSIS — K222 Esophageal obstruction: Secondary | ICD-10-CM | POA: Diagnosis present

## 2020-10-28 DIAGNOSIS — N390 Urinary tract infection, site not specified: Secondary | ICD-10-CM | POA: Diagnosis not present

## 2020-10-28 DIAGNOSIS — R569 Unspecified convulsions: Secondary | ICD-10-CM | POA: Diagnosis not present

## 2020-10-28 DIAGNOSIS — G4700 Insomnia, unspecified: Secondary | ICD-10-CM | POA: Diagnosis not present

## 2020-10-28 DIAGNOSIS — S72001S Fracture of unspecified part of neck of right femur, sequela: Secondary | ICD-10-CM | POA: Diagnosis not present

## 2020-10-28 DIAGNOSIS — R0902 Hypoxemia: Secondary | ICD-10-CM | POA: Diagnosis not present

## 2020-10-28 DIAGNOSIS — M2550 Pain in unspecified joint: Secondary | ICD-10-CM | POA: Diagnosis not present

## 2020-10-28 DIAGNOSIS — L89611 Pressure ulcer of right heel, stage 1: Secondary | ICD-10-CM | POA: Diagnosis present

## 2020-10-28 DIAGNOSIS — R000 Tachycardia, unspecified: Secondary | ICD-10-CM | POA: Diagnosis not present

## 2020-10-28 DIAGNOSIS — Z20822 Contact with and (suspected) exposure to covid-19: Secondary | ICD-10-CM | POA: Diagnosis present

## 2020-10-28 DIAGNOSIS — E785 Hyperlipidemia, unspecified: Secondary | ICD-10-CM | POA: Diagnosis not present

## 2020-10-28 DIAGNOSIS — G928 Other toxic encephalopathy: Secondary | ICD-10-CM | POA: Diagnosis present

## 2020-10-28 DIAGNOSIS — N401 Enlarged prostate with lower urinary tract symptoms: Secondary | ICD-10-CM | POA: Diagnosis not present

## 2020-10-28 DIAGNOSIS — R262 Difficulty in walking, not elsewhere classified: Secondary | ICD-10-CM | POA: Diagnosis not present

## 2020-10-28 DIAGNOSIS — I5032 Chronic diastolic (congestive) heart failure: Secondary | ICD-10-CM | POA: Diagnosis not present

## 2020-10-28 DIAGNOSIS — F0390 Unspecified dementia without behavioral disturbance: Secondary | ICD-10-CM | POA: Diagnosis present

## 2020-10-28 DIAGNOSIS — E1110 Type 2 diabetes mellitus with ketoacidosis without coma: Secondary | ICD-10-CM | POA: Diagnosis not present

## 2020-10-28 DIAGNOSIS — J869 Pyothorax without fistula: Secondary | ICD-10-CM | POA: Diagnosis not present

## 2020-10-28 DIAGNOSIS — W19XXXD Unspecified fall, subsequent encounter: Secondary | ICD-10-CM | POA: Diagnosis present

## 2020-10-28 DIAGNOSIS — E119 Type 2 diabetes mellitus without complications: Secondary | ICD-10-CM | POA: Diagnosis not present

## 2020-10-28 DIAGNOSIS — E1165 Type 2 diabetes mellitus with hyperglycemia: Secondary | ICD-10-CM | POA: Diagnosis not present

## 2020-10-28 DIAGNOSIS — F32A Depression, unspecified: Secondary | ICD-10-CM | POA: Diagnosis present

## 2020-10-28 DIAGNOSIS — R6520 Severe sepsis without septic shock: Secondary | ICD-10-CM | POA: Diagnosis not present

## 2020-10-28 DIAGNOSIS — T83518A Infection and inflammatory reaction due to other urinary catheter, initial encounter: Secondary | ICD-10-CM | POA: Diagnosis present

## 2020-10-28 DIAGNOSIS — E11649 Type 2 diabetes mellitus with hypoglycemia without coma: Secondary | ICD-10-CM | POA: Diagnosis not present

## 2020-10-28 DIAGNOSIS — E1169 Type 2 diabetes mellitus with other specified complication: Secondary | ICD-10-CM | POA: Diagnosis not present

## 2020-10-28 DIAGNOSIS — R404 Transient alteration of awareness: Secondary | ICD-10-CM | POA: Diagnosis not present

## 2020-10-28 DIAGNOSIS — G9340 Encephalopathy, unspecified: Secondary | ICD-10-CM | POA: Diagnosis not present

## 2020-10-28 DIAGNOSIS — R4182 Altered mental status, unspecified: Secondary | ICD-10-CM | POA: Diagnosis not present

## 2020-10-28 DIAGNOSIS — A419 Sepsis, unspecified organism: Secondary | ICD-10-CM | POA: Diagnosis not present

## 2020-10-28 DIAGNOSIS — Z9181 History of falling: Secondary | ICD-10-CM | POA: Diagnosis not present

## 2020-10-28 DIAGNOSIS — L89610 Pressure ulcer of right heel, unstageable: Secondary | ICD-10-CM | POA: Diagnosis not present

## 2020-10-28 DIAGNOSIS — E876 Hypokalemia: Secondary | ICD-10-CM | POA: Diagnosis not present

## 2020-10-28 DIAGNOSIS — Z741 Need for assistance with personal care: Secondary | ICD-10-CM | POA: Diagnosis not present

## 2020-10-28 DIAGNOSIS — R293 Abnormal posture: Secondary | ICD-10-CM | POA: Diagnosis not present

## 2020-10-28 DIAGNOSIS — C259 Malignant neoplasm of pancreas, unspecified: Secondary | ICD-10-CM | POA: Diagnosis not present

## 2020-10-28 DIAGNOSIS — A408 Other streptococcal sepsis: Secondary | ICD-10-CM | POA: Diagnosis present

## 2020-10-28 DIAGNOSIS — J189 Pneumonia, unspecified organism: Secondary | ICD-10-CM | POA: Diagnosis not present

## 2020-10-28 DIAGNOSIS — E46 Unspecified protein-calorie malnutrition: Secondary | ICD-10-CM | POA: Diagnosis not present

## 2020-10-28 LAB — GLUCOSE, CAPILLARY
Glucose-Capillary: 270 mg/dL — ABNORMAL HIGH (ref 70–99)
Glucose-Capillary: 286 mg/dL — ABNORMAL HIGH (ref 70–99)

## 2020-10-28 MED ORDER — INSULIN DETEMIR 100 UNIT/ML ~~LOC~~ SOLN: 30.0000 [IU] | Freq: Every day | SUBCUTANEOUS | Status: DC

## 2020-10-28 MED ORDER — DOCUSATE SODIUM 100 MG PO CAPS: 100.0000 mg | ORAL_CAPSULE | Freq: Two times a day (BID) | ORAL | 0 refills | Status: DC

## 2020-10-28 MED ORDER — TAMSULOSIN HCL 0.4 MG PO CAPS: 0.4000 mg | ORAL_CAPSULE | Freq: Every day | ORAL | Status: DC

## 2020-10-28 MED ORDER — INSULIN ASPART 100 UNIT/ML ~~LOC~~ SOLN
10.0000 [IU] | Freq: Three times a day (TID) | SUBCUTANEOUS | Status: DC
  Administered 2020-10-28 (×2): 10 [IU] via SUBCUTANEOUS

## 2020-10-28 MED ORDER — INSULIN DETEMIR 100 UNIT/ML FLEXPEN: 25.0000 [IU] | PEN_INJECTOR | SUBCUTANEOUS | Status: DC

## 2020-10-28 NOTE — TOC Transition Note (Signed)
Transition of Care [name]) - CM/SW Discharge Note  Patient Details  Name: DELDRICK Bush MRN: [id number] Date of Birth: [birth year]  Transition of Care [facility]) CM/SW Contact:  Sherie Don, LCSW Phone Number: 10/28/2020, 10:49 AM  Clinical Narrative: Patient medically stable to discharge to SNF. CSW spoke with Janie at [facility] for bed information. Patient will go to room 3251 and the number to call for report is [phone]. COVID test is negative.  Discharge summary, discharge orders, and SNF transfer report faxed to [facility] in the [place]. Wife and patient updated regarding discharge. Medical necessity form completed; PTAR scheduled. Discharge packet completed. RN updated. TOC signing off.  Final next level of care: Skilled Nursing Facility Barriers to Discharge: Barriers Resolved  Patient Goals and CMS Choice Patient states their goals for this hospitalization and ongoing recovery are:: Go to SNF for short-term rehab CMS Medicare.gov Compare Post Acute Care list provided to:: Patient Choice offered to / list presented to : Patient  Discharge Placement PASRR number recieved: 10/27/20        Patient chooses bed at: [facility] Patient to be transferred to facility by: [place] Name of family member notified: Colton Bush (wife) Patient and family notified of of transfer: 10/28/20  Discharge Plan and Services In-house Referral: Clinical Social Work Discharge Planning Services: NA Post Acute Care Choice: [place]          DME Arranged: N/A DME Agency: NA  Readmission Risk Interventions No flowsheet data found.

## 2020-10-28 NOTE — Plan of Care (Signed)
Pt had expressed concern related to going home with a foley and was provided education regarding the treatment. Pt has been down recently due to series of events during his stay and was provided emotional comfort. Pain meds given and pt has been sleeping through the night.

## 2020-10-28 NOTE — Care Management Important Message (Signed)
Important Message  Patient Details  IM Letter given to the Patient. Name: Colton Bush MRN: [id number] Date of Birth: Dec 19, 1955   Medicare Important Message Given:  Yes     Kerin Salen 10/28/2020, 12:09 PM

## 2020-10-28 NOTE — Discharge Summary (Addendum)
Physician Discharge Summary  Colton Bush WVP:[id number] DOB: [birth year] DOA: 10/18/2020  PCP: [name], MD  Admit date: 10/18/2020 Discharge date: 10/28/2020  Admitted From: Home Disposition: [facility]  Recommendations for Outpatient Follow-up:  1. Follow up with PCP in 1-2 weeks 2. Follow-up with orthopedics, Dr. Lyla Glassing as scheduled 3. [facility] versus voiding trial 2 weeks for acute urinary retention 4. Follow-up with medical/radiation oncology as scheduled 5. Please obtain BMP/CBC in one week  Discharge Condition: Stable CODE STATUS: Full code Diet recommendation: Heart healthy/consistent carbohydrate diet  History of present illness:  Colton Bush is a 65 year old male with past medical history significant for recurrent pancreatic cancer, type 2 diabetes mellitus, essential hypertension, hyperlipidemia who presented to the ED on 10/18/2020 following a fall with inability to ambulate.  Patient was in the grocery store parking lot, putting away groceries when he tried to make a quick reaction as this grocery cart was moving in which he lost his balance and fell onto his right side.  Patient was able to get up with assistance of bystanders but unable to place any weight on this right leg.  In the ED, afebrile, HR 83, RR 18, BP 140/87.  Sodium 135, potassium 3.7, chloride 100, sodium bicarbonate 25, glucose 296, BUN 19, creatinine 0.7.  WBC 23.6.  Hemoglobin 11.6.  Covid-19 PCR negative.  X-ray right hip with intratrochanteric fracture.  Patient was given IV morphine and Zofran.  Orthopedics consulted.  Hospital service consulted for further evaluation and management of acute hip fracture.  Hospital course:  Closed right hip fracture Patient presenting to the ED following ground-level mechanical fall.  Imaging studies notable for intertrochanteric hip fracture on right.  Orthopedics was consulted and patient underwent ORIF/IM nail on 10/25/2020 by  Dr. Lyla Glassing.  Continue weightbearing as tolerated to right lower extremity.  Plan Eliquis for postoperative DVT prophylaxis x3 days.  Oxycodone as needed for pain.  Discharging to SNF for further rehabilitation.  Streptococcus anginosus septicemia E. coli UTI Patient was found to have blood cultures 1/4 bottles positive for strep anginosis.  Infectious disease was consulted.  TEE with LVEF 54-62%, grade 1 diastolic dysfunction without evidence of valvular vegetations.  Patient was initially treated with ceftriaxone and completed therapy with amoxicillin for a total 7-day course.  Repeat blood cultures negative.  Acute urinary retention Patient with multiple episodes of urinary retention requiring in and out catheterization.  Foley catheter now placed on 10/27/2020.  Etiology likely secondary to poor ambulatory status with recent hip fracture.  Started on tamsulosin 0.4 mg p.o. daily.  Consider voiding trial in the next 1-2 weeks versus outpatient [facility].  Pancreatic cancer, recurrent Follows with medical oncology, Dr. [name] outpatient.  Previous Whipple surgery.  Currently plan is radiation therapy outpatient with Dr. [name]. Outpatient follow-up with medical and radiation oncology. Continue Creon  Essential hypertension Continue home regimen of amlodipine 10 mg p.o. daily, benazepril 10 mg p.o. daily  Type 2 diabetes mellitus, with hyperglycemia Hemoglobin A1c 10.8, poorly controlled.  Home regimen includes Levemir 35 units qAm and 25u qPM, NovoLog sliding scale.  Continue monitor glucose closely and adjust insulin regimen as needed  Hyperlipidemia: Crestor 20 mg p.o. daily  GERD: Continue PPI   Discharge Diagnoses:  Principal Problem:   Closed right hip fracture, initial encounter [facility]) Active Problems:   Essential hypertension   Type 2 diabetes mellitus with other specified complication ([place])   Adenocarcinoma of head of pancreas ([place])  Fall from ground  level    Discharge Instructions  Discharge Instructions    Continue foley catheter   Complete by: As directed    Diet - low sodium heart healthy   Complete by: As directed    Increase activity slowly   Complete by: As directed    No wound care   Complete by: As directed      Allergies as of 10/28/2020   No Known Allergies     Medication List    STOP taking these medications   aspirin EC 81 MG tablet   naproxen sodium 220 MG tablet Commonly known as: ALEVE     TAKE these medications   acetaminophen 500 MG tablet Commonly known as: TYLENOL Take 1,000 mg by mouth every 6 (six) hours as needed for moderate pain or headache.   amLODipine 10 MG tablet Commonly known as: NORVASC Take 1 tablet (10 mg total) by mouth daily.   apixaban 2.5 MG Tabs tablet Commonly known as: ELIQUIS Take 1 tablet (2.5 mg total) by mouth 2 (two) times daily.   benazepril 10 MG tablet Commonly known as: LOTENSIN TAKE 1 TABLET DAILY   Creon 6000-19000 units Cpep Generic drug: Pancrelipase (Lip-Prot-Amyl) Take 1 capsule by mouth with breakfast, with lunch, and with evening meal.   diphenhydrAMINE HCl (Sleep) 25 MG Tbdp Take 25 mg by mouth at bedtime as needed (sleep).   docusate sodium 100 MG capsule Commonly known as: COLACE Take 1 capsule (100 mg total) by mouth 2 (two) times daily.   ferrous sulfate 325 (65 FE) MG tablet Take 325 mg daily with breakfast by mouth.   Fish Oil 500 MG Caps Take 500 mg by mouth daily.   freestyle lancets USE AS INSTRUCTED TO CHECK BLOOD SUGAR ONCE A DAY   FREESTYLE LITE test strip Generic drug: glucose blood USE ONE STRIP TO CHECK GLUCOSE ONCE DAILY. PLEASE  SCHEDULE FOLLOW UP   insulin detemir 100 UNIT/ML FlexPen Commonly known as: LEVEMIR Inject 25-35 Units into the skin See admin instructions. Inject 35 units in the morning and 25 units at night   Insulin Pen Needle 30G X 5 MM Misc Use one daily with insulin   B-D UF III MINI PEN NEEDLES  31G X 5 MM Misc Generic drug: Insulin Pen Needle Inject into the skin.   loratadine 10 MG tablet Commonly known as: CLARITIN Take 10 mg by mouth daily.   multivitamin with minerals Tabs tablet Take 1 tablet by mouth daily.   NovoLOG FlexPen 100 UNIT/ML FlexPen Generic drug: insulin aspart GIVE EVERY MORNING WITH BREAKFAST AND EVERY EVENING WITH SUPPER PER SLIDING SCALE What changed: See the new instructions.   omeprazole 20 MG capsule Commonly known as: PRILOSEC TAKE 1 CAPSULE DAILY What changed:   how much to take  how to take this  when to take this  additional instructions   oxyCODONE 5 MG immediate release tablet Commonly known as: Oxy IR/ROXICODONE Take 1 tablet (5 mg total) by mouth every 4 (four) hours as needed for up to 7 days for moderate pain.   potassium chloride SA 20 MEQ tablet Commonly known as: Klor-Con M20 Take 1 tablet (20 mEq total) by mouth daily.   rosuvastatin 20 MG tablet Commonly known as: Crestor Take 1 tablet (20 mg total) by mouth daily.   tamsulosin 0.4 MG Caps capsule Commonly known as: FLOMAX Take 1 capsule (0.4 mg total) by mouth daily. Start taking on: October 29, 2020  Contact information for follow-up providers    [place], Aaron Edelman, MD. Schedule an appointment as soon as possible for a visit in 2 weeks.   Specialty: Orthopedic Surgery Why: For wound re-check Contact information: [address] [place] [address] [phone]        [place]. Schedule an appointment as soon as possible for a visit in 2 week(s).   Why: Acute urinary retention requiring foley catheter Contact information: [place] [phone]           Contact information for after-discharge care    Destination    [facility] Preferred SNF .   Service: Skilled Nursing Contact information: [place] [phone]                 No Known Allergies  Consultations:  [facility], Dr. Lyla Glassing   Procedures/Studies: DG Chest 1 View  Result Date: 10/18/2020 CLINICAL DATA:  Fall, pain and RIGHT hip. EXAM: CHEST  1 VIEW COMPARISON:  June 19, 2017 FINDINGS: Trachea midline. Cardiomediastinal contours and hilar structures are normal. Signs of atelectasis and scarring at the lung bases. Evidence of cervical spinal fusion partially imaged on the current study. On limited assessment no acute skeletal process. IMPRESSION: Signs of atelectasis and scarring at the lung bases. Electronically Signed   By: Zetta Bills M.D.   On: 10/18/2020 19:12   DG Knee Right Port  Result Date: 10/19/2020 CLINICAL DATA:  History of distal femur fracture. EXAM: PORTABLE RIGHT KNEE - 1-2 VIEW COMPARISON:  No prior. FINDINGS: Limited two view of exam obtained. Mild soft tissue swelling about the knee cannot be excluded. Severe Tricompartment degenerative change with chondrocalcinosis. Deformity noted of the proximal fibula consistent with old healed fracture. No acute fracture identified. IMPRESSION: 1.  Mild soft tissue swelling about the knee cannot be excluded. 2. Severe tricompartment degenerative change with chondrocalcinosis. Deformity noted the proximal right fibula consistent with old healed fracture. No acute bony abnormality identified. Electronically Signed   By: Marcello Moores  Register   On: 10/19/2020 09:46   DG C-Arm 1-60 Min-No Report  Result Date: 10/25/2020 Fluoroscopy was utilized by the requesting physician.  No radiographic interpretation.   ECHOCARDIOGRAM COMPLETE  Result Date: 10/22/2020    ECHOCARDIOGRAM REPORT   Patient Name:   KEISTON MANLEY Date of Exam: 10/22/2020 Medical Rec #:  [id number]      Height:       70.0 in Accession #:    [id number]     Weight:       196.0 lb Date of Birth:  1956/06/22      BSA:          2.069 m Patient Age:    33 years       BP:           136/69 mmHg  Patient Gender: M              HR:           79 bpm. Exam Location:  Inpatient Procedure: 2D Echo, Cardiac Doppler and Color Doppler Indications:    Bacteremia  History:        Patient has no prior history of Echocardiogram examinations.                 Signs/Symptoms:Bacteremia and UTI; Risk Factors:Hypertension,                 Diabetes  and Dyslipidemia. Pancreatic cancer.  Sonographer:    Dustin Flock Referring Phys: [phone] A CALDWELL POWELL JR  Sonographer Comments: Image acquisition challenging due to respiratory motion. IMPRESSIONS  1. Left ventricular ejection fraction, by estimation, is 55 to 60%. The left ventricle has normal function. The left ventricle has no regional wall motion abnormalities. There is mild concentric left ventricular hypertrophy. Left ventricular diastolic parameters are consistent with Grade I diastolic dysfunction (impaired relaxation).  2. Right ventricular systolic function is normal. The right ventricular size is normal.  3. The mitral valve is normal in structure. No evidence of mitral valve regurgitation. No evidence of mitral stenosis.  4. The aortic valve is normal in structure. Aortic valve regurgitation is not visualized. No aortic stenosis is present.  5. The inferior vena cava is normal in size with greater than 50% respiratory variability, suggesting right atrial pressure of 3 mmHg. Conclusion(s)/Recommendation(s): No evidence of valvular vegetations on this transthoracic echocardiogram. Would recommend a transesophageal echocardiogram to exclude infective endocarditis if clinically indicated. FINDINGS  Left Ventricle: Left ventricular ejection fraction, by estimation, is 55 to 60%. The left ventricle has normal function. The left ventricle has no regional wall motion abnormalities. The left ventricular internal cavity size was normal in size. There is  mild concentric left ventricular hypertrophy. Left ventricular diastolic parameters are consistent with Grade I  diastolic dysfunction (impaired relaxation). Indeterminate filling pressures. Right Ventricle: The right ventricular size is normal. No increase in right ventricular wall thickness. Right ventricular systolic function is normal. Left Atrium: Left atrial size was normal in size. Right Atrium: Right atrial size was normal in size. Pericardium: There is no evidence of pericardial effusion. Mitral Valve: The mitral valve is normal in structure. Mild mitral annular calcification. No evidence of mitral valve regurgitation. No evidence of mitral valve stenosis. Tricuspid Valve: The tricuspid valve is normal in structure. Tricuspid valve regurgitation is not demonstrated. No evidence of tricuspid stenosis. Aortic Valve: The aortic valve is normal in structure. Aortic valve regurgitation is not visualized. No aortic stenosis is present. Pulmonic Valve: The pulmonic valve was normal in structure. Pulmonic valve regurgitation is not visualized. No evidence of pulmonic stenosis. Aorta: The aortic root is normal in size and structure. Venous: The inferior vena cava is normal in size with greater than 50% respiratory variability, suggesting right atrial pressure of 3 mmHg. IAS/Shunts: No atrial level shunt detected by color flow Doppler.  LEFT VENTRICLE PLAX 2D LVIDd:         3.90 cm  Diastology LVIDs:         2.70 cm  LV e' medial:    7.72 cm/s LV PW:         1.30 cm  LV E/e' medial:  9.3 LV IVS:        1.30 cm  LV e' lateral:   6.64 cm/s LVOT diam:     2.50 cm  LV E/e' lateral: 10.8 LV SV:         102 LV SV Index:   49 LVOT Area:     4.91 cm  RIGHT VENTRICLE RV Basal diam:  2.90 cm RV S prime:     10.80 cm/s TAPSE (M-mode): 2.9 cm LEFT ATRIUM             Index       RIGHT ATRIUM           Index LA diam:        3.30 cm 1.59 cm/m  RA Area:  12.20 cm LA Vol (A2C):   54.9 ml 26.53 ml/m RA Volume:   29.40 ml  14.21 ml/m LA Vol (A4C):   29.8 ml 14.40 ml/m LA Biplane Vol: 41.4 ml 20.01 ml/m  AORTIC VALVE LVOT Vmax:   92.40  cm/s LVOT Vmean:  63.500 cm/s LVOT VTI:    0.207 m  AORTA Ao Root diam: 3.20 cm MITRAL VALVE MV Area (PHT): 7.16 cm     SHUNTS MV Decel Time: 106 msec     Systemic VTI:  0.21 m MV E velocity: 71.80 cm/s   Systemic Diam: 2.50 cm MV A velocity: 111.00 cm/s MV E/A ratio:  0.65 Mihai Croitoru MD Electronically signed by Sanda Klein MD Signature Date/Time: 10/22/2020/4:07:33 PM    Final    DG HIP OPERATIVE UNILAT W OR W/O PELVIS RIGHT  Result Date: 10/25/2020 CLINICAL DATA:  IM nail right femur EXAM: OPERATIVE right HIP (WITH PELVIS IF PERFORMED) 4 VIEWS TECHNIQUE: Fluoroscopic spot image(s) were submitted for interpretation post-operatively. COMPARISON:  10/18/2020 FINDINGS: Four low resolution intraoperative spot views of the right hip. Total fluoroscopy time was 48 seconds. The images demonstrate intramedullary rod and distal screw fixation of right femur for comminuted intertrochanteric fracture. IMPRESSION: Intraoperative fluoroscopic assistance provided during surgical fixation of right proximal femoral fracture Electronically Signed   By: Donavan Foil M.D.   On: 10/25/2020 19:10   DG Hip Unilat With Pelvis 2-3 Views Right  Result Date: 10/18/2020 CLINICAL DATA:  Fall, hip pain status post fall EXAM: DG HIP (WITH OR WITHOUT PELVIS) 2-3V RIGHT COMPARISON:  Prior CT imaging of the chest, abdomen and pelvis. FINDINGS: No fracture of the bony pelvis.  Spinal degenerative changes. Comminuted RIGHT proximal femoral fracture, intratrochanteric type with distraction of the lesser trochanter. Mild displacement of distal femur relative to proximal femur, slight anterior displacement 2-3 mm. Mild to moderate varus angulation at the fracture site. Sclerotic lesion in the LEFT iliac bone unchanged likely large bone island. IMPRESSION: Comminuted mildly angulated RIGHT intratrochanteric fracture. Electronically Signed   By: Zetta Bills M.D.   On: 10/18/2020 19:17     Subjective: Patient seen and examined at  bedside, resting comfortably.  Discharging to SNF today.  No specific questions or concerns at this time.  Denies headache, no fever/chills/night sweats, no nausea/vomiting/diarrhea, no chest pain, palpitations, no abdominal pain, no weakness, no fatigue, no paresthesias.  No acute events overnight per nursing staff.  Discharge Exam: Vitals:   10/28/20 0510 10/28/20 1401  BP: 136/69 127/60  Pulse: 80 88  Resp: 16 18  Temp: 97.9 F (36.6 C) 98.9 F (37.2 C)  SpO2: 93% 94%   Vitals:   10/27/20 1312 10/27/20 2105 10/28/20 0510 10/28/20 1401  BP: (!) 113/59 129/65 136/69 127/60  Pulse: 81 78 80 88  Resp: 18 16 16 18   Temp: (!) 97.4 F (36.3 C) 98.9 F (37.2 C) 97.9 F (36.6 C) 98.9 F (37.2 C)  TempSrc: Oral Oral Oral Oral  SpO2: 94% 94% 93% 94%  Weight:      Height:        General: Pt is alert, awake, not in acute distress Cardiovascular: RRR, S1/S2 +, no rubs, no gallops Respiratory: CTA bilaterally, no wheezing, no rhonchi, on room air Abdominal: Soft, NT, ND, bowel sounds + GU: Foley catheter noted with clear yellow urine Extremities: no edema, no cyanosis, surgical dressings in place, clean/dry/intact    The results of significant diagnostics from this hospitalization (including imaging, microbiology, ancillary and laboratory) are listed below  for reference.     Microbiology: Recent Results (from the past 240 hour(s))  Resp Panel by RT-PCR (Flu A&B, Covid) Nasopharyngeal Swab     Status: None   Collection Time: 10/18/20  7:11 PM   Specimen: Nasopharyngeal Swab; Nasopharyngeal(NP) swabs in vial transport medium  Result Value Ref Range Status   SARS Coronavirus 2 by RT PCR NEGATIVE NEGATIVE Final    Comment: (NOTE) SARS-CoV-2 target nucleic acids are NOT DETECTED.  The SARS-CoV-2 RNA is generally detectable in upper respiratory specimens during the acute phase of infection. The lowest concentration of SARS-CoV-2 viral copies this assay can detect is 138 copies/mL.  A negative result does not preclude SARS-Cov-2 infection and should not be used as the sole basis for treatment or other patient management decisions. A negative result may occur with  improper specimen collection/handling, submission of specimen other than nasopharyngeal swab, presence of viral mutation(s) within the areas targeted by this assay, and inadequate number of viral copies(<138 copies/mL). A negative result must be combined with clinical observations, patient history, and epidemiological information. The expected result is Negative.  Fact Sheet for Patients:  EntrepreneurPulse.com.au  Fact Sheet for Healthcare Providers:  IncredibleEmployment.be  This test is no t yet approved or cleared by the [place] FDA and  has been authorized for detection and/or diagnosis of SARS-CoV-2 by FDA under an Emergency Use Authorization (EUA). This EUA will remain  in effect (meaning this test can be used) for the duration of the COVID-19 declaration under Section 564(b)(1) of the Act, 21 U.S.C.section 360bbb-3(b)(1), unless the authorization is terminated  or revoked sooner.       Influenza A by PCR NEGATIVE NEGATIVE Final   Influenza B by PCR NEGATIVE NEGATIVE Final    Comment: (NOTE) The Xpert Xpress SARS-CoV-2/FLU/RSV plus assay is intended as an aid in the diagnosis of influenza from Nasopharyngeal swab specimens and should not be used as a sole basis for treatment. Nasal washings and aspirates are unacceptable for Xpert Xpress SARS-CoV-2/FLU/RSV testing.  Fact Sheet for Patients: EntrepreneurPulse.com.au  Fact Sheet for Healthcare Providers: IncredibleEmployment.be  This test is not yet approved or cleared by the [place] FDA and has been authorized for detection and/or diagnosis of SARS-CoV-2 by FDA under an Emergency Use Authorization (EUA). This EUA will remain in effect (meaning this test  can be used) for the duration of the COVID-19 declaration under Section 564(b)(1) of the Act, 21 U.S.C. section 360bbb-3(b)(1), unless the authorization is terminated or revoked.  Performed at [facility], [place] [address] [place], [place] [address]   Surgical pcr screen     Status: Abnormal   Collection Time: 10/19/20 12:47 AM   Specimen: Nasal Mucosa; Nasal Swab  Result Value Ref Range Status   MRSA, PCR NEGATIVE NEGATIVE Final   Staphylococcus aureus POSITIVE (A) NEGATIVE Final    Comment: RESULT CALLED TO, READ BACK BY AND VERIFIED WITH: BOBBY, RN @ [phone] ON 10/19/20 C VARNER (NOTE) The Xpert SA Assay (FDA approved for NASAL specimens in patients 26 years of age and older), is one component of a comprehensive surveillance program. It is not intended to diagnose infection nor to guide or monitor treatment. Performed at [facility], [place] [address], [place], [place] [address]   Culture, blood (routine x 2)     Status: None   Collection Time: 10/20/20  7:55 AM   Specimen: BLOOD  Result Value Ref Range Status   Specimen Description   Final  BLOOD RIGHT ANTECUBITAL Performed at [facility] [address], [place], [place] [address]    Special Requests   Final    BOTTLES DRAWN AEROBIC AND ANAEROBIC Blood Culture results may not be optimal due to an inadequate volume of blood received in culture bottles Performed at [facility] [address], [place], [place] [address]    Culture   Final    NO GROWTH 5 DAYS Performed at [facility], [place] [address], [place], [place] [address]    Report Status 10/25/2020 FINAL  Final  Culture, blood (routine x 2)     Status: Abnormal   Collection Time: 10/20/20  7:56 AM   Specimen: BLOOD  Result Value Ref Range Status   Specimen Description   Final    BLOOD RIGHT ANTECUBITAL Performed at [facility] [address] [place],  [place] [address]    Special Requests   Final    BOTTLES DRAWN AEROBIC AND ANAEROBIC Blood Culture results may not be optimal due to an inadequate volume of blood received in culture bottles Performed at [facility] [address] [place], [place] [address]    Culture  Setup Time   Final    AEROBIC BOTTLE ONLY GRAM POSITIVE COCCI CRITICAL RESULT CALLED TO, READ BACK BY AND VERIFIED WITHMelodye Ped [facility] 3570 10/21/20 A BROWNING Performed at [facility], [place] [address] [place],  [address]    Culture STREPTOCOCCUS ANGINOSIS (A)  Final   Report Status 10/24/2020 FINAL  Final   Organism ID, Bacteria STREPTOCOCCUS ANGINOSIS  Final      Susceptibility   Streptococcus anginosis - MIC*    PENICILLIN <=0.06 SENSITIVE Sensitive     CEFTRIAXONE 0.5 SENSITIVE Sensitive     ERYTHROMYCIN <=0.12 SENSITIVE Sensitive     LEVOFLOXACIN 0.5 SENSITIVE Sensitive     VANCOMYCIN 1 SENSITIVE Sensitive     * STREPTOCOCCUS ANGINOSIS  Blood Culture ID Panel (Reflexed)     Status: Abnormal   Collection Time: 10/20/20  7:56 AM  Result Value Ref Range Status   Enterococcus faecalis NOT DETECTED NOT DETECTED Final   Enterococcus Faecium NOT DETECTED NOT DETECTED Final   Listeria monocytogenes NOT DETECTED NOT DETECTED Final   Staphylococcus species NOT DETECTED NOT DETECTED Final   Staphylococcus aureus (BCID) NOT DETECTED NOT DETECTED Final   Staphylococcus epidermidis NOT DETECTED NOT DETECTED Final   Staphylococcus lugdunensis NOT DETECTED NOT DETECTED Final   Streptococcus species DETECTED (A) NOT DETECTED Final    Comment: Not Enterococcus species, Streptococcus agalactiae, Streptococcus pyogenes, or Streptococcus pneumoniae. CRITICAL RESULT CALLED TO, READ BACK BY AND VERIFIED WITH: Melodye Ped PHARMD 1316 10/21/20 A BROWNING    Streptococcus agalactiae NOT DETECTED NOT DETECTED Final   Streptococcus pneumoniae NOT DETECTED NOT DETECTED Final   Streptococcus pyogenes NOT DETECTED  NOT DETECTED Final   A.calcoaceticus-baumannii NOT DETECTED NOT DETECTED Final   Bacteroides fragilis NOT DETECTED NOT DETECTED Final   Enterobacterales NOT DETECTED NOT DETECTED Final   Enterobacter cloacae complex NOT DETECTED NOT DETECTED Final   Escherichia coli NOT DETECTED NOT DETECTED Final   Klebsiella aerogenes NOT DETECTED NOT DETECTED Final   Klebsiella oxytoca NOT DETECTED NOT DETECTED Final   Klebsiella pneumoniae NOT DETECTED NOT DETECTED Final   Proteus species NOT DETECTED NOT DETECTED Final   Salmonella species NOT DETECTED NOT DETECTED Final   Serratia marcescens NOT DETECTED NOT DETECTED Final   Haemophilus influenzae NOT DETECTED NOT  DETECTED Final   Neisseria meningitidis NOT DETECTED NOT DETECTED Final   Pseudomonas aeruginosa NOT DETECTED NOT DETECTED Final   Stenotrophomonas maltophilia NOT DETECTED NOT DETECTED Final   Candida albicans NOT DETECTED NOT DETECTED Final   Candida auris NOT DETECTED NOT DETECTED Final   Candida glabrata NOT DETECTED NOT DETECTED Final   Candida krusei NOT DETECTED NOT DETECTED Final   Candida parapsilosis NOT DETECTED NOT DETECTED Final   Candida tropicalis NOT DETECTED NOT DETECTED Final   Cryptococcus neoformans/gattii NOT DETECTED NOT DETECTED Final    Comment: Performed at [facility], [place] [address] [place], [place] [address]  Culture, Urine     Status: Abnormal   Collection Time: 10/20/20 10:25 AM   Specimen: Urine, Clean Catch  Result Value Ref Range Status   Specimen Description   Final    URINE, CLEAN CATCH Performed at [facility], [place] [address] [place], [place] [address]    Special Requests   Final    NONE Performed at [facility], [place] [address] [place], [place] [address]    Culture >=100,000 COLONIES/mL ESCHERICHIA COLI (A)  Final   Report Status 10/23/2020 FINAL  Final   Organism ID, Bacteria ESCHERICHIA COLI (A)  Final      Susceptibility   Escherichia coli  - MIC*    AMPICILLIN 4 SENSITIVE Sensitive     CEFAZOLIN <=4 SENSITIVE Sensitive     CEFEPIME <=0.12 SENSITIVE Sensitive     CEFTRIAXONE <=0.25 SENSITIVE Sensitive     CIPROFLOXACIN <=0.25 SENSITIVE Sensitive     GENTAMICIN <=1 SENSITIVE Sensitive     IMIPENEM <=0.25 SENSITIVE Sensitive     NITROFURANTOIN <=16 SENSITIVE Sensitive     TRIMETH/SULFA <=20 SENSITIVE Sensitive     AMPICILLIN/SULBACTAM <=2 SENSITIVE Sensitive     PIP/TAZO <=4 SENSITIVE Sensitive     * >=100,000 COLONIES/mL ESCHERICHIA COLI  Culture, blood (routine x 2)     Status: None   Collection Time: 10/22/20  7:26 AM   Specimen: BLOOD  Result Value Ref Range Status   Specimen Description   Final    BLOOD LEFT ANTECUBITAL Performed at [place] [address] [place], [place] [address]    Special Requests   Final    BOTTLES DRAWN AEROBIC ONLY Blood Culture results may not be optimal due to an inadequate volume of blood received in culture bottles Performed at [place] [address] [place], [place] [address]    Culture   Final    NO GROWTH 5 DAYS Performed at [facility], [place] [address], [place], [place] [address]    Report Status 10/27/2020 FINAL  Final  Culture, blood (routine x 2)     Status: None   Collection Time: 10/22/20  7:26 AM   Specimen: BLOOD  Result Value Ref Range Status   Specimen Description   Final    BLOOD LEFT ANTECUBITAL Performed at [place] [address], [place], [place] [address]    Special Requests   Final    BOTTLES DRAWN AEROBIC ONLY Blood Culture results may not be optimal due to an inadequate volume of blood received in culture bottles Performed at [place] [address] [place], [place] [address]    Culture   Final    NO GROWTH 5 DAYS Performed at [facility], [place] [address], [place],  [address]    Report Status 10/27/2020 FINAL  Final  SARS CORONAVIRUS 2  (TAT 6-24 HRS) Nasopharyngeal Nasopharyngeal Swab     Status: None   Collection Time: 10/27/20  1:19 PM   Specimen: Nasopharyngeal Swab  Result Value Ref Range Status   SARS Coronavirus 2 NEGATIVE NEGATIVE Final    Comment: (NOTE) SARS-CoV-2 target nucleic acids are NOT DETECTED.  The SARS-CoV-2 RNA is generally detectable in upper and lower respiratory specimens during the acute phase of infection. Negative results do not preclude SARS-CoV-2 infection, do not rule out co-infections with other pathogens, and should not be used as the sole basis for treatment or other patient management decisions. Negative results must be combined with clinical observations, patient history, and epidemiological information. The expected result is Negative.  Fact Sheet for Patients: SugarRoll.be  Fact Sheet for Healthcare Providers: https://www.woods-mathews.com/  This test is not yet approved or cleared by the [place] FDA and  has been authorized for detection and/or diagnosis of SARS-CoV-2 by FDA under an Emergency Use Authorization (EUA). This EUA will remain  in effect (meaning this test can be used) for the duration of the COVID-19 declaration under Se ction 564(b)(1) of the Act, 21 U.S.C. section 360bbb-3(b)(1), unless the authorization is terminated or revoked sooner.  Performed at [facility], [place] [address], [place], [place] [address]      Labs: BNP (last 3 results) No results for input(s): BNP in the last 8760 hours. Basic Metabolic Panel: Recent Labs  Lab 10/22/20 0307 10/23/20 0321 10/24/20 0251 10/25/20 0316 10/26/20 0330 10/27/20 0327  NA 133* 134* 134* 132* 132* 135  K 3.6 3.6 4.3 4.2 4.7 4.0  CL 103 103 103 102 101 102  CO2 23 23 24 24 24 24   GLUCOSE 189* 195* 336* 312* 347* 265*  BUN 15 18 22 20  24* 28*  CREATININE 0.76 0.66 0.72 0.62 0.64 0.68  CALCIUM 7.7* 7.9* 7.9* 8.0* 8.3* 8.4*  MG 2.0 2.2 2.1 2.0  --   --    PHOS 2.7 2.8 2.8 3.1 4.1  --    Liver Function Tests: Recent Labs  Lab 10/22/20 0307 10/23/20 0321 10/24/20 0251 10/25/20 0316 10/26/20 0330  AST 22 16 18  12* 17  ALT 25 21 21 18 21   ALKPHOS 210* 198* 255* 218* 211*  BILITOT 0.8 0.8 0.6 0.5 0.8  PROT 7.0 7.0 7.0 6.9 7.2  ALBUMIN 2.2* 2.2* 2.1* 2.1* 2.3*   No results for input(s): LIPASE, AMYLASE in the last 168 hours. No results for input(s): AMMONIA in the last 168 hours. CBC: Recent Labs  Lab 10/22/20 0307 10/23/20 0321 10/24/20 0251 10/25/20 0316 10/26/20 0330 10/27/20 0327  WBC 13.6* 12.5* 12.7* 14.7* 19.2* 15.3*  NEUTROABS 10.9* 9.5* 10.2* 12.0* 18.2*  --   HGB 10.3* 10.4* 10.7* 10.3* 9.6* 10.6*  HCT 31.5* 32.0* 32.4* 31.3* 29.4* 32.5*  MCV 90.0 91.7 90.5 92.3 91.9 92.3  PLT 446* 511* 554* 536* 494* 541*   Cardiac Enzymes: No results for input(s): CKTOTAL, CKMB, CKMBINDEX, TROPONINI in the last 168 hours. BNP: Invalid input(s): POCBNP CBG: Recent Labs  Lab 10/27/20 1136 10/27/20 1644 10/27/20 2108 10/28/20 0726 10/28/20 1204  GLUCAP 282* 183* 251* 270* 286*   D-Dimer No results for input(s): DDIMER in the last 72 hours. Hgb A1c No results for input(s): HGBA1C in the last 72 hours. Lipid Profile No results for input(s): CHOL, HDL, LDLCALC, TRIG, CHOLHDL, LDLDIRECT in the last 72 hours. Thyroid function studies No results for input(s): TSH, T4TOTAL, T3FREE, THYROIDAB in the last  72 hours.  Invalid input(s): FREET3 Anemia work up No results for input(s): VITAMINB12, FOLATE, FERRITIN, TIBC, IRON, RETICCTPCT in the last 72 hours. Urinalysis    Component Value Date/Time   COLORURINE YELLOW 10/20/2020 1025   APPEARANCEUR CLEAR 10/20/2020 1025   LABSPEC 1.020 10/20/2020 1025   PHURINE 5.0 10/20/2020 1025   GLUCOSEU NEGATIVE 10/20/2020 1025   HGBUR MODERATE (A) 10/20/2020 1025   BILIRUBINUR NEGATIVE 10/20/2020 1025   BILIRUBINUR 1+ 02/23/2016 1256   KETONESUR NEGATIVE 10/20/2020 1025   PROTEINUR  30 (A) 10/20/2020 1025   UROBILINOGEN 1.0 02/23/2016 1256   NITRITE POSITIVE (A) 10/20/2020 1025   LEUKOCYTESUR TRACE (A) 10/20/2020 1025   Sepsis Labs Invalid input(s): PROCALCITONIN,  WBC,  LACTICIDVEN Microbiology Recent Results (from the past 240 hour(s))  Resp Panel by RT-PCR (Flu A&B, Covid) Nasopharyngeal Swab     Status: None   Collection Time: 10/18/20  7:11 PM   Specimen: Nasopharyngeal Swab; Nasopharyngeal(NP) swabs in vial transport medium  Result Value Ref Range Status   SARS Coronavirus 2 by RT PCR NEGATIVE NEGATIVE Final    Comment: (NOTE) SARS-CoV-2 target nucleic acids are NOT DETECTED.  The SARS-CoV-2 RNA is generally detectable in upper respiratory specimens during the acute phase of infection. The lowest concentration of SARS-CoV-2 viral copies this assay can detect is 138 copies/mL. A negative result does not preclude SARS-Cov-2 infection and should not be used as the sole basis for treatment or other patient management decisions. A negative result may occur with  improper specimen collection/handling, submission of specimen other than nasopharyngeal swab, presence of viral mutation(s) within the areas targeted by this assay, and inadequate number of viral copies(<138 copies/mL). A negative result must be combined with clinical observations, patient history, and epidemiological information. The expected result is Negative.  Fact Sheet for Patients:  EntrepreneurPulse.com.au  Fact Sheet for Healthcare Providers:  IncredibleEmployment.be  This test is no t yet approved or cleared by the [place] FDA and  has been authorized for detection and/or diagnosis of SARS-CoV-2 by FDA under an Emergency Use Authorization (EUA). This EUA will remain  in effect (meaning this test can be used) for the duration of the COVID-19 declaration under Section 564(b)(1) of the Act, 21 U.S.C.section 360bbb-3(b)(1), unless the authorization  is terminated  or revoked sooner.       Influenza A by PCR NEGATIVE NEGATIVE Final   Influenza B by PCR NEGATIVE NEGATIVE Final    Comment: (NOTE) The Xpert Xpress SARS-CoV-2/FLU/RSV plus assay is intended as an aid in the diagnosis of influenza from Nasopharyngeal swab specimens and should not be used as a sole basis for treatment. Nasal washings and aspirates are unacceptable for Xpert Xpress SARS-CoV-2/FLU/RSV testing.  Fact Sheet for Patients: EntrepreneurPulse.com.au  Fact Sheet for Healthcare Providers: IncredibleEmployment.be  This test is not yet approved or cleared by the [place] FDA and has been authorized for detection and/or diagnosis of SARS-CoV-2 by FDA under an Emergency Use Authorization (EUA). This EUA will remain in effect (meaning this test can be used) for the duration of the COVID-19 declaration under Section 564(b)(1) of the Act, 21 U.S.C. section 360bbb-3(b)(1), unless the authorization is terminated or revoked.  Performed at [facility], [place] [address], [place], [place] [address]   Surgical pcr screen     Status: Abnormal   Collection Time: 10/19/20 12:47 AM   Specimen: Nasal Mucosa; Nasal Swab  Result Value Ref Range Status   MRSA, PCR NEGATIVE NEGATIVE Final   Staphylococcus aureus  POSITIVE (A) NEGATIVE Final    Comment: RESULT CALLED TO, READ BACK BY AND VERIFIED WITH: BOBBY, RN @ [phone] ON 10/19/20 C VARNER (NOTE) The Xpert SA Assay (FDA approved for NASAL specimens in patients 44 years of age and older), is one component of a comprehensive surveillance program. It is not intended to diagnose infection nor to guide or monitor treatment. Performed at [facility], [place] [address] [place], [place] [address]   Culture, blood (routine x 2)     Status: None   Collection Time: 10/20/20  7:55 AM   Specimen: BLOOD  Result Value Ref Range Status   Specimen Description    Final    BLOOD RIGHT ANTECUBITAL Performed at [place] [address] [place], [place] [address]    Special Requests   Final    BOTTLES DRAWN AEROBIC AND ANAEROBIC Blood Culture results may not be optimal due to an inadequate volume of blood received in culture bottles Performed at [place] [address], [place], [place] [address]    Culture   Final    NO GROWTH 5 DAYS Performed at [facility], [place] [address], [place], [place] [address]    Report Status 10/25/2020 FINAL  Final  Culture, blood (routine x 2)     Status: Abnormal   Collection Time: 10/20/20  7:56 AM   Specimen: BLOOD  Result Value Ref Range Status   Specimen Description   Final    BLOOD RIGHT ANTECUBITAL Performed at [place] [address], [place], [place] [address]    Special Requests   Final    BOTTLES DRAWN AEROBIC AND ANAEROBIC Blood Culture results may not be optimal due to an inadequate volume of blood received in culture bottles Performed at [place] [address] [place], [place] [address]    Culture  Setup Time   Final    AEROBIC BOTTLE ONLY GRAM POSITIVE COCCI CRITICAL RESULT CALLED TO, READ BACK BY AND VERIFIED WITHMelodye Ped [facility] 5009 10/21/20 A BROWNING Performed at [facility], [place] [address], [place], [place] [address]    Culture STREPTOCOCCUS ANGINOSIS (A)  Final   Report Status 10/24/2020 FINAL  Final   Organism ID, Bacteria STREPTOCOCCUS ANGINOSIS  Final      Susceptibility   Streptococcus anginosis - MIC*    PENICILLIN <=0.06 SENSITIVE Sensitive     CEFTRIAXONE 0.5 SENSITIVE Sensitive     ERYTHROMYCIN <=0.12 SENSITIVE Sensitive     LEVOFLOXACIN 0.5 SENSITIVE Sensitive     VANCOMYCIN 1 SENSITIVE Sensitive     * STREPTOCOCCUS ANGINOSIS  Blood Culture ID Panel (Reflexed)     Status: Abnormal   Collection Time: 10/20/20  7:56 AM  Result Value Ref Range Status   Enterococcus  faecalis NOT DETECTED NOT DETECTED Final   Enterococcus Faecium NOT DETECTED NOT DETECTED Final   Listeria monocytogenes NOT DETECTED NOT DETECTED Final   Staphylococcus species NOT DETECTED NOT DETECTED Final   Staphylococcus aureus (BCID) NOT DETECTED NOT DETECTED Final   Staphylococcus epidermidis NOT DETECTED NOT DETECTED Final   Staphylococcus lugdunensis NOT DETECTED NOT DETECTED Final   Streptococcus species DETECTED (A) NOT DETECTED Final    Comment: Not Enterococcus species, Streptococcus agalactiae, Streptococcus pyogenes, or Streptococcus pneumoniae. CRITICAL RESULT CALLED TO, READ BACK BY AND VERIFIED WITHMelodye Ped PHARMD 9937 10/21/20 A BROWNING    Streptococcus agalactiae NOT DETECTED NOT DETECTED Final   Streptococcus pneumoniae NOT  DETECTED NOT DETECTED Final   Streptococcus pyogenes NOT DETECTED NOT DETECTED Final   A.calcoaceticus-baumannii NOT DETECTED NOT DETECTED Final   Bacteroides fragilis NOT DETECTED NOT DETECTED Final   Enterobacterales NOT DETECTED NOT DETECTED Final   Enterobacter cloacae complex NOT DETECTED NOT DETECTED Final   Escherichia coli NOT DETECTED NOT DETECTED Final   Klebsiella aerogenes NOT DETECTED NOT DETECTED Final   Klebsiella oxytoca NOT DETECTED NOT DETECTED Final   Klebsiella pneumoniae NOT DETECTED NOT DETECTED Final   Proteus species NOT DETECTED NOT DETECTED Final   Salmonella species NOT DETECTED NOT DETECTED Final   Serratia marcescens NOT DETECTED NOT DETECTED Final   Haemophilus influenzae NOT DETECTED NOT DETECTED Final   Neisseria meningitidis NOT DETECTED NOT DETECTED Final   Pseudomonas aeruginosa NOT DETECTED NOT DETECTED Final   Stenotrophomonas maltophilia NOT DETECTED NOT DETECTED Final   Candida albicans NOT DETECTED NOT DETECTED Final   Candida auris NOT DETECTED NOT DETECTED Final   Candida glabrata NOT DETECTED NOT DETECTED Final   Candida krusei NOT DETECTED NOT DETECTED Final   Candida parapsilosis NOT DETECTED  NOT DETECTED Final   Candida tropicalis NOT DETECTED NOT DETECTED Final   Cryptococcus neoformans/gattii NOT DETECTED NOT DETECTED Final    Comment: Performed at [facility], [address], [place], [place] [address]  Culture, Urine     Status: Abnormal   Collection Time: 10/20/20 10:25 AM   Specimen: Urine, Clean Catch  Result Value Ref Range Status   Specimen Description   Final    URINE, CLEAN CATCH Performed at [facility], [place] [address], [place], [place] [address]    Special Requests   Final    NONE Performed at [facility], [place] [address], [place], [place] [address]    Culture >=100,000 COLONIES/mL ESCHERICHIA COLI (A)  Final   Report Status 10/23/2020 FINAL  Final   Organism ID, Bacteria ESCHERICHIA COLI (A)  Final      Susceptibility   Escherichia coli - MIC*    AMPICILLIN 4 SENSITIVE Sensitive     CEFAZOLIN <=4 SENSITIVE Sensitive     CEFEPIME <=0.12 SENSITIVE Sensitive     CEFTRIAXONE <=0.25 SENSITIVE Sensitive     CIPROFLOXACIN <=0.25 SENSITIVE Sensitive     GENTAMICIN <=1 SENSITIVE Sensitive     IMIPENEM <=0.25 SENSITIVE Sensitive     NITROFURANTOIN <=16 SENSITIVE Sensitive     TRIMETH/SULFA <=20 SENSITIVE Sensitive     AMPICILLIN/SULBACTAM <=2 SENSITIVE Sensitive     PIP/TAZO <=4 SENSITIVE Sensitive     * >=100,000 COLONIES/mL ESCHERICHIA COLI  Culture, blood (routine x 2)     Status: None   Collection Time: 10/22/20  7:26 AM   Specimen: BLOOD  Result Value Ref Range Status   Specimen Description   Final    BLOOD LEFT ANTECUBITAL Performed at [place] [address] [place], [place] [address]    Special Requests   Final    BOTTLES DRAWN AEROBIC ONLY Blood Culture results may not be optimal due to an inadequate volume of blood received in culture bottles Performed at [place] [address], [place], [place] [address]    Culture   Final    NO GROWTH 5  DAYS Performed at [facility], [place] [address], [place], [place] [address]    Report Status 10/27/2020 FINAL  Final  Culture, blood (routine x 2)     Status: None   Collection Time: 10/22/20  7:26 AM   Specimen: BLOOD  Result Value Ref Range Status   Specimen Description   Final    BLOOD LEFT ANTECUBITAL Performed at [place] [address], [place], [place] [address]    Special Requests   Final    BOTTLES DRAWN AEROBIC ONLY Blood Culture results may not be optimal due to an inadequate volume of blood received in culture bottles Performed at [place] [address], [place], [place] [address]    Culture   Final    NO GROWTH 5 DAYS Performed at [facility], [place] [address], [place], [place] [address]    Report Status 10/27/2020 FINAL  Final  SARS CORONAVIRUS 2 (TAT 6-24 HRS) Nasopharyngeal Nasopharyngeal Swab     Status: None   Collection Time: 10/27/20  1:19 PM   Specimen: Nasopharyngeal Swab  Result Value Ref Range Status   SARS Coronavirus 2 NEGATIVE NEGATIVE Final    Comment: (NOTE) SARS-CoV-2 target nucleic acids are NOT DETECTED.  The SARS-CoV-2 RNA is generally detectable in upper and lower respiratory specimens during the acute phase of infection. Negative results do not preclude SARS-CoV-2 infection, do not rule out co-infections with other pathogens, and should not be used as the sole basis for treatment or other patient management decisions. Negative results must be combined with clinical observations, patient history, and epidemiological information. The expected result is Negative.  Fact Sheet for Patients: SugarRoll.be  Fact Sheet for Healthcare Providers: https://www.woods-mathews.com/  This test is not yet approved or cleared by the [place] FDA and  has been authorized for detection and/or diagnosis of SARS-CoV-2 by FDA under an Emergency Use  Authorization (EUA). This EUA will remain  in effect (meaning this test can be used) for the duration of the COVID-19 declaration under Se ction 564(b)(1) of the Act, 21 U.S.C. section 360bbb-3(b)(1), unless the authorization is terminated or revoked sooner.  Performed at [facility], [place] [address] [place],  [address]      Time coordinating discharge: Over 30 minutes  SIGNED:   Hiliana Eilts J [place] DO  [facility] 10/28/2020, 3:00 PM

## 2020-10-29 DIAGNOSIS — C259 Malignant neoplasm of pancreas, unspecified: Secondary | ICD-10-CM | POA: Diagnosis not present

## 2020-10-29 DIAGNOSIS — R339 Retention of urine, unspecified: Secondary | ICD-10-CM | POA: Diagnosis not present

## 2020-10-29 DIAGNOSIS — A419 Sepsis, unspecified organism: Secondary | ICD-10-CM | POA: Diagnosis not present

## 2020-10-29 DIAGNOSIS — E785 Hyperlipidemia, unspecified: Secondary | ICD-10-CM | POA: Diagnosis not present

## 2020-10-29 DIAGNOSIS — S72001A Fracture of unspecified part of neck of right femur, initial encounter for closed fracture: Secondary | ICD-10-CM | POA: Diagnosis not present

## 2020-10-29 DIAGNOSIS — G4700 Insomnia, unspecified: Secondary | ICD-10-CM | POA: Diagnosis not present

## 2020-10-29 DIAGNOSIS — K219 Gastro-esophageal reflux disease without esophagitis: Secondary | ICD-10-CM | POA: Diagnosis not present

## 2020-10-29 DIAGNOSIS — E119 Type 2 diabetes mellitus without complications: Secondary | ICD-10-CM | POA: Diagnosis not present

## 2020-10-29 DIAGNOSIS — R269 Unspecified abnormalities of gait and mobility: Secondary | ICD-10-CM | POA: Diagnosis not present

## 2020-10-29 DIAGNOSIS — I10 Essential (primary) hypertension: Secondary | ICD-10-CM | POA: Diagnosis not present

## 2020-10-29 DIAGNOSIS — N390 Urinary tract infection, site not specified: Secondary | ICD-10-CM | POA: Diagnosis not present

## 2020-10-29 DIAGNOSIS — S72001S Fracture of unspecified part of neck of right femur, sequela: Secondary | ICD-10-CM | POA: Diagnosis not present

## 2020-10-29 DIAGNOSIS — E1165 Type 2 diabetes mellitus with hyperglycemia: Secondary | ICD-10-CM | POA: Diagnosis not present

## 2020-10-29 DIAGNOSIS — E46 Unspecified protein-calorie malnutrition: Secondary | ICD-10-CM | POA: Diagnosis not present

## 2020-10-29 DIAGNOSIS — W19XXXA Unspecified fall, initial encounter: Secondary | ICD-10-CM | POA: Diagnosis not present

## 2020-11-02 DIAGNOSIS — I10 Essential (primary) hypertension: Secondary | ICD-10-CM | POA: Diagnosis not present

## 2020-11-02 DIAGNOSIS — R339 Retention of urine, unspecified: Secondary | ICD-10-CM | POA: Diagnosis not present

## 2020-11-02 DIAGNOSIS — C259 Malignant neoplasm of pancreas, unspecified: Secondary | ICD-10-CM | POA: Diagnosis not present

## 2020-11-02 DIAGNOSIS — S72001S Fracture of unspecified part of neck of right femur, sequela: Secondary | ICD-10-CM | POA: Diagnosis not present

## 2020-11-04 ENCOUNTER — Ambulatory Visit: Payer: Medicare Other | Admitting: Radiation Oncology

## 2020-11-04 DIAGNOSIS — L89610 Pressure ulcer of right heel, unstageable: Secondary | ICD-10-CM | POA: Diagnosis not present

## 2020-11-05 ENCOUNTER — Ambulatory Visit: Payer: Medicare Other | Admitting: Radiation Oncology

## 2020-11-05 ENCOUNTER — Other Ambulatory Visit: Payer: Self-pay

## 2020-11-05 ENCOUNTER — Emergency Department (HOSPITAL_COMMUNITY): Payer: Medicare Other

## 2020-11-05 ENCOUNTER — Inpatient Hospital Stay (HOSPITAL_COMMUNITY)
Admission: EM | Admit: 2020-11-05 | Discharge: 2020-11-12 | DRG: 853 | Disposition: A | Discharge status: Rehabilitation Facility | Payer: Medicare Other | Attending: Internal Medicine | Admitting: Internal Medicine

## 2020-11-05 ENCOUNTER — Encounter (HOSPITAL_COMMUNITY): Payer: Self-pay | Admitting: Emergency Medicine

## 2020-11-05 DIAGNOSIS — A419 Sepsis, unspecified organism: Secondary | ICD-10-CM | POA: Diagnosis present

## 2020-11-05 DIAGNOSIS — C250 Malignant neoplasm of head of pancreas: Secondary | ICD-10-CM | POA: Diagnosis not present

## 2020-11-05 DIAGNOSIS — E1110 Type 2 diabetes mellitus with ketoacidosis without coma: Secondary | ICD-10-CM | POA: Diagnosis present

## 2020-11-05 DIAGNOSIS — N401 Enlarged prostate with lower urinary tract symptoms: Secondary | ICD-10-CM | POA: Diagnosis not present

## 2020-11-05 DIAGNOSIS — S72001S Fracture of unspecified part of neck of right femur, sequela: Secondary | ICD-10-CM | POA: Diagnosis not present

## 2020-11-05 DIAGNOSIS — T83518A Infection and inflammatory reaction due to other urinary catheter, initial encounter: Secondary | ICD-10-CM | POA: Diagnosis present

## 2020-11-05 DIAGNOSIS — J189 Pneumonia, unspecified organism: Secondary | ICD-10-CM | POA: Diagnosis present

## 2020-11-05 DIAGNOSIS — Z7901 Long term (current) use of anticoagulants: Secondary | ICD-10-CM

## 2020-11-05 DIAGNOSIS — N390 Urinary tract infection, site not specified: Secondary | ICD-10-CM | POA: Diagnosis not present

## 2020-11-05 DIAGNOSIS — N138 Other obstructive and reflux uropathy: Secondary | ICD-10-CM | POA: Diagnosis present

## 2020-11-05 DIAGNOSIS — Z923 Personal history of irradiation: Secondary | ICD-10-CM

## 2020-11-05 DIAGNOSIS — Z903 Acquired absence of stomach [part of]: Secondary | ICD-10-CM

## 2020-11-05 DIAGNOSIS — G928 Other toxic encephalopathy: Secondary | ICD-10-CM | POA: Diagnosis not present

## 2020-11-05 DIAGNOSIS — A408 Other streptococcal sepsis: Secondary | ICD-10-CM | POA: Diagnosis not present

## 2020-11-05 DIAGNOSIS — E1169 Type 2 diabetes mellitus with other specified complication: Secondary | ICD-10-CM

## 2020-11-05 DIAGNOSIS — E8729 Other acidosis: Secondary | ICD-10-CM | POA: Diagnosis present

## 2020-11-05 DIAGNOSIS — R0602 Shortness of breath: Secondary | ICD-10-CM

## 2020-11-05 DIAGNOSIS — S065XAA Traumatic subdural hemorrhage with loss of consciousness status unknown, initial encounter: Secondary | ICD-10-CM

## 2020-11-05 DIAGNOSIS — K750 Abscess of liver: Secondary | ICD-10-CM | POA: Diagnosis not present

## 2020-11-05 DIAGNOSIS — E876 Hypokalemia: Secondary | ICD-10-CM | POA: Diagnosis present

## 2020-11-05 DIAGNOSIS — R0902 Hypoxemia: Secondary | ICD-10-CM | POA: Diagnosis not present

## 2020-11-05 DIAGNOSIS — Z20822 Contact with and (suspected) exposure to covid-19: Secondary | ICD-10-CM | POA: Diagnosis not present

## 2020-11-05 DIAGNOSIS — D75839 Thrombocytosis, unspecified: Secondary | ICD-10-CM | POA: Diagnosis present

## 2020-11-05 DIAGNOSIS — Z981 Arthrodesis status: Secondary | ICD-10-CM

## 2020-11-05 DIAGNOSIS — E11649 Type 2 diabetes mellitus with hypoglycemia without coma: Secondary | ICD-10-CM | POA: Diagnosis not present

## 2020-11-05 DIAGNOSIS — I110 Hypertensive heart disease with heart failure: Secondary | ICD-10-CM | POA: Diagnosis present

## 2020-11-05 DIAGNOSIS — K8689 Other specified diseases of pancreas: Secondary | ICD-10-CM

## 2020-11-05 DIAGNOSIS — L89152 Pressure ulcer of sacral region, stage 2: Secondary | ICD-10-CM | POA: Diagnosis present

## 2020-11-05 DIAGNOSIS — I10 Essential (primary) hypertension: Secondary | ICD-10-CM | POA: Diagnosis not present

## 2020-11-05 DIAGNOSIS — F0390 Unspecified dementia without behavioral disturbance: Secondary | ICD-10-CM | POA: Diagnosis present

## 2020-11-05 DIAGNOSIS — R6520 Severe sepsis without septic shock: Secondary | ICD-10-CM | POA: Diagnosis not present

## 2020-11-05 DIAGNOSIS — E782 Mixed hyperlipidemia: Secondary | ICD-10-CM

## 2020-11-05 DIAGNOSIS — N320 Bladder-neck obstruction: Secondary | ICD-10-CM

## 2020-11-05 DIAGNOSIS — S065X9A Traumatic subdural hemorrhage with loss of consciousness of unspecified duration, initial encounter: Secondary | ICD-10-CM

## 2020-11-05 DIAGNOSIS — C801 Malignant (primary) neoplasm, unspecified: Secondary | ICD-10-CM | POA: Diagnosis present

## 2020-11-05 DIAGNOSIS — E119 Type 2 diabetes mellitus without complications: Secondary | ICD-10-CM | POA: Diagnosis not present

## 2020-11-05 DIAGNOSIS — R000 Tachycardia, unspecified: Secondary | ICD-10-CM | POA: Diagnosis not present

## 2020-11-05 DIAGNOSIS — I5032 Chronic diastolic (congestive) heart failure: Secondary | ICD-10-CM | POA: Diagnosis not present

## 2020-11-05 DIAGNOSIS — T83511A Infection and inflammatory reaction due to indwelling urethral catheter, initial encounter: Secondary | ICD-10-CM

## 2020-11-05 DIAGNOSIS — R339 Retention of urine, unspecified: Secondary | ICD-10-CM | POA: Diagnosis not present

## 2020-11-05 DIAGNOSIS — Z823 Family history of stroke: Secondary | ICD-10-CM

## 2020-11-05 DIAGNOSIS — F32A Depression, unspecified: Secondary | ICD-10-CM | POA: Diagnosis present

## 2020-11-05 DIAGNOSIS — K222 Esophageal obstruction: Secondary | ICD-10-CM

## 2020-11-05 DIAGNOSIS — B9620 Unspecified Escherichia coli [E. coli] as the cause of diseases classified elsewhere: Secondary | ICD-10-CM | POA: Diagnosis present

## 2020-11-05 DIAGNOSIS — G4089 Other seizures: Secondary | ICD-10-CM | POA: Diagnosis not present

## 2020-11-05 DIAGNOSIS — Z833 Family history of diabetes mellitus: Secondary | ICD-10-CM

## 2020-11-05 DIAGNOSIS — L89611 Pressure ulcer of right heel, stage 1: Secondary | ICD-10-CM | POA: Diagnosis present

## 2020-11-05 DIAGNOSIS — R4182 Altered mental status, unspecified: Secondary | ICD-10-CM | POA: Diagnosis not present

## 2020-11-05 DIAGNOSIS — K219 Gastro-esophageal reflux disease without esophagitis: Secondary | ICD-10-CM

## 2020-11-05 DIAGNOSIS — J869 Pyothorax without fistula: Secondary | ICD-10-CM

## 2020-11-05 DIAGNOSIS — C259 Malignant neoplasm of pancreas, unspecified: Secondary | ICD-10-CM | POA: Diagnosis not present

## 2020-11-05 DIAGNOSIS — R471 Dysarthria and anarthria: Secondary | ICD-10-CM | POA: Diagnosis present

## 2020-11-05 DIAGNOSIS — R1312 Dysphagia, oropharyngeal phase: Secondary | ICD-10-CM | POA: Diagnosis present

## 2020-11-05 DIAGNOSIS — R4701 Aphasia: Secondary | ICD-10-CM | POA: Diagnosis present

## 2020-11-05 DIAGNOSIS — B961 Klebsiella pneumoniae [K. pneumoniae] as the cause of diseases classified elsewhere: Secondary | ICD-10-CM | POA: Diagnosis present

## 2020-11-05 DIAGNOSIS — M25369 Other instability, unspecified knee: Secondary | ICD-10-CM | POA: Diagnosis not present

## 2020-11-05 DIAGNOSIS — E1165 Type 2 diabetes mellitus with hyperglycemia: Secondary | ICD-10-CM

## 2020-11-05 DIAGNOSIS — Z794 Long term (current) use of insulin: Secondary | ICD-10-CM

## 2020-11-05 DIAGNOSIS — Z9221 Personal history of antineoplastic chemotherapy: Secondary | ICD-10-CM

## 2020-11-05 DIAGNOSIS — E872 Acidosis: Secondary | ICD-10-CM | POA: Diagnosis present

## 2020-11-05 DIAGNOSIS — Z85820 Personal history of malignant melanoma of skin: Secondary | ICD-10-CM

## 2020-11-05 DIAGNOSIS — S72141D Displaced intertrochanteric fracture of right femur, subsequent encounter for closed fracture with routine healing: Secondary | ICD-10-CM

## 2020-11-05 DIAGNOSIS — R569 Unspecified convulsions: Secondary | ICD-10-CM

## 2020-11-05 DIAGNOSIS — L0291 Cutaneous abscess, unspecified: Secondary | ICD-10-CM

## 2020-11-05 DIAGNOSIS — Z79899 Other long term (current) drug therapy: Secondary | ICD-10-CM

## 2020-11-05 DIAGNOSIS — M19042 Primary osteoarthritis, left hand: Secondary | ICD-10-CM | POA: Diagnosis present

## 2020-11-05 DIAGNOSIS — D649 Anemia, unspecified: Secondary | ICD-10-CM | POA: Diagnosis present

## 2020-11-05 DIAGNOSIS — G929 Unspecified toxic encephalopathy: Secondary | ICD-10-CM

## 2020-11-05 DIAGNOSIS — Z8249 Family history of ischemic heart disease and other diseases of the circulatory system: Secondary | ICD-10-CM

## 2020-11-05 DIAGNOSIS — Z90411 Acquired partial absence of pancreas: Secondary | ICD-10-CM

## 2020-11-05 DIAGNOSIS — E860 Dehydration: Secondary | ICD-10-CM | POA: Diagnosis present

## 2020-11-05 DIAGNOSIS — W19XXXD Unspecified fall, subsequent encounter: Secondary | ICD-10-CM | POA: Diagnosis present

## 2020-11-05 LAB — CBC WITH DIFFERENTIAL/PLATELET
Abs Immature Granulocytes: 0.09 10*3/uL — ABNORMAL HIGH (ref 0.00–0.07)
Basophils Absolute: 0.1 10*3/uL (ref 0.0–0.1)
Basophils Relative: 0 %
Eosinophils Absolute: 0 10*3/uL (ref 0.0–0.5)
Eosinophils Relative: 0 %
HCT: 39.1 % (ref 39.0–52.0)
Hemoglobin: 12.5 g/dL — ABNORMAL LOW (ref 13.0–17.0)
Immature Granulocytes: 0 %
Lymphocytes Relative: 2 %
Lymphs Abs: 0.4 10*3/uL — ABNORMAL LOW (ref 0.7–4.0)
MCH: 30.3 pg (ref 26.0–34.0)
MCHC: 32 g/dL (ref 30.0–36.0)
MCV: 94.7 fL (ref 80.0–100.0)
Monocytes Absolute: 0.7 10*3/uL (ref 0.1–1.0)
Monocytes Relative: 3 %
Neutro Abs: 20.1 10*3/uL — ABNORMAL HIGH (ref 1.7–7.7)
Neutrophils Relative %: 95 %
Platelets: 529 10*3/uL — ABNORMAL HIGH (ref 150–400)
RBC: 4.13 MIL/uL — ABNORMAL LOW (ref 4.22–5.81)
RDW: 14.8 % (ref 11.5–15.5)
WBC: 21.3 10*3/uL — ABNORMAL HIGH (ref 4.0–10.5)
nRBC: 0 % (ref 0.0–0.2)

## 2020-11-05 LAB — COMPREHENSIVE METABOLIC PANEL
ALT: 24 U/L (ref 0–44)
AST: 20 U/L (ref 15–41)
Albumin: 2.8 g/dL — ABNORMAL LOW (ref 3.5–5.0)
Alkaline Phosphatase: 336 U/L — ABNORMAL HIGH (ref 38–126)
Anion gap: 18 — ABNORMAL HIGH (ref 5–15)
BUN: 18 mg/dL (ref 8–23)
CO2: 14 mmol/L — ABNORMAL LOW (ref 22–32)
Calcium: 8.6 mg/dL — ABNORMAL LOW (ref 8.9–10.3)
Chloride: 102 mmol/L (ref 98–111)
Creatinine, Ser: 0.72 mg/dL (ref 0.61–1.24)
GFR, Estimated: >60 mL/min (ref >60)
Glucose, Bld: 352 mg/dL — ABNORMAL HIGH (ref 70–99)
Potassium: 4.3 mmol/L (ref 3.5–5.1)
Sodium: 134 mmol/L — ABNORMAL LOW (ref 135–145)
Total Bilirubin: 1.6 mg/dL — ABNORMAL HIGH (ref 0.3–1.2)
Total Protein: 8.2 g/dL — ABNORMAL HIGH (ref 6.5–8.1)

## 2020-11-05 LAB — PROTIME-INR
INR: 1.5 — ABNORMAL HIGH (ref 0.8–1.2)
Prothrombin Time: 17.7 seconds — ABNORMAL HIGH (ref 11.4–15.2)

## 2020-11-05 LAB — URINALYSIS, ROUTINE W REFLEX MICROSCOPIC
Bilirubin Urine: NEGATIVE
Glucose, UA: >=500 mg/dL — AB
Ketones, ur: 80 mg/dL — AB
Nitrite: NEGATIVE
Protein, ur: 30 mg/dL — AB
Specific Gravity, Urine: 1.027 (ref 1.005–1.030)
WBC, UA: >50 WBC/hpf — ABNORMAL HIGH (ref 0–5)
pH: 5 (ref 5.0–8.0)

## 2020-11-05 LAB — LACTIC ACID, PLASMA: Lactic Acid, Venous: 1.6 mmol/L (ref 0.5–1.9)

## 2020-11-05 LAB — CBG MONITORING, ED: Glucose-Capillary: 332 mg/dL — ABNORMAL HIGH (ref 70–99)

## 2020-11-05 LAB — RESP PANEL BY RT-PCR (FLU A&B, COVID) ARPGX2
Influenza A by PCR: NEGATIVE
Influenza B by PCR: NEGATIVE
SARS Coronavirus 2 by RT PCR: NEGATIVE

## 2020-11-05 LAB — APTT: aPTT: 32 seconds (ref 24–36)

## 2020-11-05 LAB — I-STAT CHEM 8, ED
BUN: 18 mg/dL (ref 8–23)
Calcium, Ion: 1.17 mmol/L (ref 1.15–1.40)
Chloride: 104 mmol/L (ref 98–111)
Creatinine, Ser: 0.5 mg/dL — ABNORMAL LOW (ref 0.61–1.24)
Glucose, Bld: 363 mg/dL — ABNORMAL HIGH (ref 70–99)
HCT: 38 % — ABNORMAL LOW (ref 39.0–52.0)
Hemoglobin: 12.9 g/dL — ABNORMAL LOW (ref 13.0–17.0)
Potassium: 4.4 mmol/L (ref 3.5–5.1)
Sodium: 136 mmol/L (ref 135–145)
TCO2: 17 mmol/L — ABNORMAL LOW (ref 22–32)

## 2020-11-05 MED ORDER — ACETAMINOPHEN 325 MG PO TABS
650.0000 mg | ORAL_TABLET | Freq: Four times a day (QID) | ORAL | Status: DC | PRN
  Administered 2020-11-09 – 2020-11-12 (×6): 650 mg via ORAL
  Filled 2020-11-05 (×6): qty 2

## 2020-11-05 MED ORDER — APIXABAN 2.5 MG PO TABS
2.5000 mg | ORAL_TABLET | Freq: Two times a day (BID) | ORAL | Status: DC
  Administered 2020-11-06: 2.5 mg via ORAL
  Filled 2020-11-05: qty 1

## 2020-11-05 MED ORDER — LACTATED RINGERS IV SOLN: INTRAVENOUS | Status: DC

## 2020-11-05 MED ORDER — AMLODIPINE BESYLATE 5 MG PO TABS: 10.0000 mg | ORAL_TABLET | Freq: Every day | ORAL | Status: DC

## 2020-11-05 MED ORDER — BENAZEPRIL HCL 10 MG PO TABS
10.0000 mg | ORAL_TABLET | Freq: Every day | ORAL | Status: DC
  Filled 2020-11-05: qty 1

## 2020-11-05 MED ORDER — INSULIN DETEMIR 100 UNIT/ML ~~LOC~~ SOLN
25.0000 [IU] | Freq: Every day | SUBCUTANEOUS | Status: DC
  Administered 2020-11-06: 25 [IU] via SUBCUTANEOUS
  Filled 2020-11-05: qty 0.25

## 2020-11-05 MED ORDER — INSULIN DETEMIR 100 UNIT/ML ~~LOC~~ SOLN
35.0000 [IU] | Freq: Every day | SUBCUTANEOUS | Status: DC
  Filled 2020-11-05: qty 0.35

## 2020-11-05 MED ORDER — SODIUM CHLORIDE 0.9 % IV BOLUS (SEPSIS)
1000.0000 mL | Freq: Once | INTRAVENOUS | Status: AC
  Administered 2020-11-05: 1000 mL via INTRAVENOUS

## 2020-11-05 MED ORDER — ROSUVASTATIN CALCIUM 20 MG PO TABS: 20.0000 mg | ORAL_TABLET | Freq: Every day | ORAL | Status: DC

## 2020-11-05 MED ORDER — SODIUM CHLORIDE 0.9 % IV SOLN
2.0000 g | Freq: Three times a day (TID) | INTRAVENOUS | Status: DC
  Administered 2020-11-06: 2 g via INTRAVENOUS
  Filled 2020-11-05: qty 1.67

## 2020-11-05 MED ORDER — ONDANSETRON HCL 4 MG/2ML IJ SOLN
4.0000 mg | Freq: Once | INTRAMUSCULAR | Status: AC
  Administered 2020-11-05: 4 mg via INTRAVENOUS
  Filled 2020-11-05: qty 2

## 2020-11-05 MED ORDER — VANCOMYCIN HCL IN DEXTROSE 1-5 GM/200ML-% IV SOLN: 1000.0000 mg | Freq: Once | INTRAVENOUS | Status: DC

## 2020-11-05 MED ORDER — LORATADINE 10 MG PO TABS
10.0000 mg | ORAL_TABLET | Freq: Every day | ORAL | Status: DC
  Administered 2020-11-08 – 2020-11-12 (×4): 10 mg via ORAL
  Filled 2020-11-05 (×6): qty 1

## 2020-11-05 MED ORDER — SODIUM CHLORIDE 0.9 % IV SOLN
2.0000 g | Freq: Once | INTRAVENOUS | Status: AC
  Administered 2020-11-05: 2 g via INTRAVENOUS
  Filled 2020-11-05: qty 2

## 2020-11-05 MED ORDER — VANCOMYCIN HCL 1500 MG/300ML IV SOLN
1500.0000 mg | Freq: Once | INTRAVENOUS | Status: AC
  Administered 2020-11-05: 1500 mg via INTRAVENOUS
  Filled 2020-11-05: qty 300

## 2020-11-05 MED ORDER — TAMSULOSIN HCL 0.4 MG PO CAPS
0.4000 mg | ORAL_CAPSULE | Freq: Every day | ORAL | Status: DC
  Administered 2020-11-08 – 2020-11-12 (×4): 0.4 mg via ORAL
  Filled 2020-11-05 (×6): qty 1

## 2020-11-05 MED ORDER — ONDANSETRON HCL 4 MG PO TABS: 4.0000 mg | ORAL_TABLET | Freq: Four times a day (QID) | ORAL | Status: DC | PRN

## 2020-11-05 MED ORDER — ONDANSETRON HCL 4 MG/2ML IJ SOLN: 4.0000 mg | Freq: Four times a day (QID) | INTRAMUSCULAR | Status: DC | PRN

## 2020-11-05 MED ORDER — METRONIDAZOLE IN NACL 5-0.79 MG/ML-% IV SOLN
500.0000 mg | Freq: Once | INTRAVENOUS | Status: AC
  Administered 2020-11-05: 500 mg via INTRAVENOUS
  Filled 2020-11-05: qty 100

## 2020-11-05 MED ORDER — ACETAMINOPHEN 325 MG PO TABS
650.0000 mg | ORAL_TABLET | Freq: Once | ORAL | Status: AC
  Administered 2020-11-05: 650 mg via ORAL
  Filled 2020-11-05: qty 2

## 2020-11-05 MED ORDER — POLYETHYLENE GLYCOL 3350 17 G PO PACK: 17.0000 g | PACK | Freq: Every day | ORAL | Status: DC | PRN

## 2020-11-05 MED ORDER — ACETAMINOPHEN 650 MG RE SUPP
650.0000 mg | Freq: Four times a day (QID) | RECTAL | Status: DC | PRN
Start: 2020-11-05 — End: 2020-11-12

## 2020-11-05 MED ORDER — INSULIN ASPART 100 UNIT/ML ~~LOC~~ SOLN: 0.0000 [IU] | Freq: Three times a day (TID) | SUBCUTANEOUS | Status: DC

## 2020-11-05 MED ORDER — PANTOPRAZOLE SODIUM 40 MG PO TBEC: 40.0000 mg | DELAYED_RELEASE_TABLET | Freq: Every day | ORAL | Status: DC

## 2020-11-05 NOTE — Progress Notes (Signed)
A consult was received from an ED physician for Cefepime and Vancomycin per pharmacy dosing.  The patient's profile has been reviewed for ht/wt/allergies/indication/available labs.   A one time order has been placed for Cefepime 2g and Vancomycin 1500mg .  Further antibiotics/pharmacy consults should be ordered by admitting physician if indicated.                  Thank you,  Gretta Arab PharmD, BCPS Clinical Pharmacist [facility] main pharmacy [phone] 11/05/2020 6:31 PM

## 2020-11-05 NOTE — H&P (Addendum)
History and Physical    Colton Bush OQH:[id number] DOB: 1956-03-11 DOA: 11/05/2020  PCP: Wenda Low, MD  Patient coming from: [facility]  Chief Complaint: Altered Mental Status   HPI:  65 year old male with past medical history of pancreatic cancer (Dx'ed 2017 now with recent recurrence), diabetes mellitus type 2, hypertension, hyperlipidemia, diastolic congestive heart failure (Echo 09/2020 EF 55-60% with G1DD) and recent right hip fracture status post repair with intramedullary nail who presents to [facility] emergency department after his skilled risk of solidity is identified that he has had sudden change in mental status.  Patient is extremely lethargic and unfortunately unable to provide a history.  History has been obtained from a coronation of discussions with the wife at the bedside as well as discussions with the emergency department staff and review of emergency department notes.  Note, patient was hospitalized at [facility] from 3/21 until 3/23.  Patient initially presented status post fall and was found to have suffered a right hip fracture.  Hospitalization was complicated by urinary retention requiring Foley catheter placement.  Additionally, patient was thought to be suffering from an E. coli urinary tract infection.  Finally, patient was also found to have 1 blood culture bottle positive for strep anginosis.  Patient was initially treated with intravenous ceftriaxone.  Patient was evaluate by infectious disease.  Repeat blood cultures ended up without growth.  And patient was eventually cleared to proceed with repair of the right hip.  Fixation with intramedullary nail was performed on 3/28 and the patient was eventually discharged to [facility] skilled nursing facility on 3/31.  Wife explains that the patient was slowly regaining his strength at [place] facility throughout his stay there.  At baseline, the patient has no known history  of cognitive deficit.  The patient has no known history of dementia.  24 to 48 hours ago the patient began to develop lethargy.  This rapidly progressed and became quite severe, associated with generalized weakness and poor appetite.  Over the span of time patient also began to exhibit a dry nonproductive cough.  Patient exhibited no other symptoms such as shortness of breath, abdominal pain, diarrhea.  EMS was eventually contacted and patient was brought to [facility] emergency room for evaluation.  Upon evaluation in the emergency department, patient was found to exhibit multiple SIRS criteria including tachycardia, fever of 102.9 F and leukocytosis.  Urinalysis was once again suggestive of urinary tract infection.  Patient was felt to be suffering from catheter associated urinary tract infection and sepsis and therefore the hospitalist group was then called to assess the patient for admission to the hospital.    Review of Systems:   Review of Systems  Unable to perform ROS: Mental status change    Past Medical History:  Diagnosis Date  . Arthritis    left hand  . Bronchitis 1977  . Cancer ([place]) 03/09/2016   pancreatic cancer, sees Dr. Cristino Martes at [facility]   . Depression    takes Cymbalta daily  . Diabetes mellitus type II    sees Dr. Chalmers Cater   . GERD (gastroesophageal reflux disease)    takes Omeprazole daily  . H/O hiatal hernia   . Hyperlipidemia    takes Zocor daily  . Hypertension    takes Amlodipine daily  . Hypoglycemia 06/18/2017  . Neck pain    C4-7 stenosis and herniated disc  . Neuromuscular disorder ([place])    hiatal hernia  . Scoliosis  slight  . Spinal cord injury, C5-C7 ([place])    c4-c7  . Stiffness of hand joint    d/t cervical issues    Past Surgical History:  Procedure Laterality Date  . ANTERIOR CERVICAL DECOMP/DISCECTOMY FUSION  08/18/2011   Procedure: ANTERIOR CERVICAL DECOMPRESSION/DISCECTOMY FUSION 3 LEVELS;  Surgeon: Winfield Cunas, MD;   Location: [facility] [facility];  Service: Neurosurgery;  Laterality: N/A;  Anterior Cervical Four-Five/Five-Six/Six-Seven Decompression with Fusion, Plating, and Bonegraft  . CARPAL TUNNEL RELEASE  2013   bilateral, per Dr. Christella Noa   . COLONOSCOPY  10-30-14   per Dr. Olevia Perches, clear, repeat in 10 yrs   . egd with esophageal dilation  9-08   per Dr. Olevia Perches  . ERCP N/A 03/01/2016   Procedure: ENDOSCOPIC RETROGRADE CHOLANGIOPANCREATOGRAPHY (ERCP) with brushings and stent;  Surgeon: Doran Stabler, MD;  Location: [facility];  Service: Endoscopy;  Laterality: N/A;  . ESOPHAGOGASTRODUODENOSCOPY (EGD) WITH PROPOFOL N/A 05/27/2020   Procedure: ESOPHAGOGASTRODUODENOSCOPY (EGD) WITH PROPOFOL;  Surgeon: Milus Banister, MD;  Location: [facility];  Service: Endoscopy;  Laterality: N/A;  . EUS N/A 03/09/2016   Procedure: ESOPHAGEAL ENDOSCOPIC ULTRASOUND (EUS) RADIAL;  Surgeon: Milus Banister, MD;  Location: [facility];  Service: Endoscopy;  Laterality: N/A;  . EUS N/A 05/27/2020   Procedure: UPPER ENDOSCOPIC ULTRASOUND (EUS) RADIAL;  Surgeon: Milus Banister, MD;  Location: [facility];  Service: Endoscopy;  Laterality: N/A;  . FEMUR IM NAIL Right 10/25/2020   Procedure: INTRAMEDULLARY (IM) NAIL FEMORAL;  Surgeon: Rod Can, MD;  Location: [facility];  Service: Orthopedics;  Laterality: Right;  . lymph nodes biopsy    . melanoma rt calf  1999  . PORT-A-CATH REMOVAL N/A 04/03/2019   Procedure: PORT REMOVAL;  Surgeon: Stark Klein, MD;  Location: [name];  Service: General;  Laterality: N/A;  . PORTACATH PLACEMENT Left 03/22/2016   Procedure: INSERTION PORT-A-CATH;  Surgeon: Stark Klein, MD;  Location: [facility];  Service: General;  Laterality: Left;  . SPINE SURGERY    . TONSILLECTOMY     as a child  . ULNAR TUNNEL RELEASE  2013   right arm, per Dr. Christella Noa   . UPPER GASTROINTESTINAL ENDOSCOPY    . WHIPPLE PROCEDURE N/A 09/19/2016   Procedure: DIAGNOSTIC LAPAROSCOPY, LAPAROSCOPIC LIVER BIOPSY,  RETROPERITONEAL EXPLORATION, INTRAOPERATIVE ULTRASOUND;  Surgeon: Stark Klein, MD;  Location: [place];  Service: General;  Laterality: N/A;     reports that he has never smoked. He has never used smokeless tobacco. He reports that he does not drink alcohol and does not use drugs.  No Known Allergies  Family History  Problem Relation Age of Onset  . Heart disease Father   . Heart disease Brother 53  . Anesthesia problems Mother   . Heart disease Mother   . Dementia Mother   . Diabetes Sister   . Stroke Sister   . Colon cancer Neg Hx   . Rectal cancer Neg Hx   . Stomach cancer Neg Hx      Prior to Admission medications   Medication Sig Start Date End Date Taking? Authorizing Provider  acetaminophen (TYLENOL) 500 MG tablet Take 1,000 mg by mouth every 6 (six) hours as needed for moderate pain or headache.    [provider]  amLODipine (NORVASC) 10 MG tablet Take 1 tablet (10 mg total) by mouth daily. 01/13/20   [name], MD  apixaban (ELIQUIS) 2.5 MG TABS tablet Take 1 tablet (2.5 mg total) by mouth 2 (two) times  daily. 10/27/20 11/26/20  Cherlynn June B, PA  B-D UF III MINI PEN NEEDLES 31G X 5 MM MISC Inject into the skin. 07/12/20   [provider]  benazepril (LOTENSIN) 10 MG tablet TAKE 1 TABLET DAILY Patient taking differently: Take 10 mg by mouth daily. 10/04/20   [name], MD  CREON 6000-19000 units CPEP Take 1 capsule by mouth with breakfast, with lunch, and with evening meal.  04/15/20   [provider]  diphenhydrAMINE HCl, Sleep, 25 MG TBDP Take 25 mg by mouth at bedtime as needed (sleep).    [provider]  docusate sodium (COLACE) 100 MG capsule Take 1 capsule (100 mg total) by mouth 2 (two) times daily. 10/28/20   [place] Donnamarie Poag, DO  ferrous sulfate 325 (65 FE) MG tablet Take 325 mg daily with breakfast by mouth.    [provider]  FREESTYLE LITE test strip USE ONE STRIP TO CHECK GLUCOSE ONCE DAILY. PLEASE  SCHEDULE  FOLLOW UP 11/09/16   [name], MD  insulin detemir (LEVEMIR) 100 UNIT/ML FlexPen Inject 25-35 Units into the skin See admin instructions. Inject 35 units in the morning and 25 units at night 10/28/20   [place] Donnamarie Poag, DO  Insulin Pen Needle 30G X 5 MM MISC Use one daily with insulin 03/04/16   [place], Gay Filler, MD  Lancets (FREESTYLE) lancets USE AS INSTRUCTED TO CHECK BLOOD SUGAR ONCE A DAY 06/02/16   [name], MD  loratadine (CLARITIN) 10 MG tablet Take 10 mg by mouth daily.    [provider]  Multiple Vitamin (MULTIVITAMIN WITH MINERALS) TABS tablet Take 1 tablet by mouth daily.    [provider]  NOVOLOG FLEXPEN 100 UNIT/ML FlexPen GIVE EVERY MORNING WITH BREAKFAST AND EVERY EVENING WITH SUPPER PER SLIDING SCALE Patient taking differently: Inject 3-12 Units into the skin 3 (three) times daily with meals. 04/17/17   [name], MD  Omega-3 Fatty Acids (FISH OIL) 500 MG CAPS Take 500 mg by mouth daily.    [provider]  omeprazole (PRILOSEC) 20 MG capsule TAKE 1 CAPSULE DAILY Patient taking differently: Take 20 mg by mouth daily. 12/02/19   [name], MD  potassium chloride SA (KLOR-CON M20) 20 MEQ tablet Take 1 tablet (20 mEq total) by mouth daily. 07/22/20   [name], MD  rosuvastatin (CRESTOR) 20 MG tablet Take 1 tablet (20 mg total) by mouth daily. 07/22/20   [name], MD  tamsulosin (FLOMAX) 0.4 MG CAPS capsule Take 1 capsule (0.4 mg total) by mouth daily. 10/29/20   [place] Eric J, DO    Physical Exam: Vitals:   11/06/20 0300 11/06/20 0400 11/06/20 0500 11/06/20 0600  BP: 134/77 119/70 124/70 117/63  Pulse: (!) 127 (!) 129 (!) 120 (!) 120  Resp: (!) 23 20 20  (!) 22  Temp: 98.6 F (37 C)  98 F (36.7 C) 99.6 F (37.6 C)  TempSrc: Oral Oral Oral Oral  SpO2:   97% 95%  Weight:      Height:        Constitutional: Patient is extremely lethargic and minimally arousable.  Patient is currently in mild respiratory distress. Skin: No  rashes seen.  Extremely poor skin turgor noted.  Multiple stage II pressure ulcerations noted over the sacrum. Eyes: Pupils are equally reactive to light.  No evidence of scleral icterus or conjunctival pallor.  ENMT: Extremely dry mucous membranes noted.  Posterior pharynx clear of any exudate or lesions.  Neck: normal, supple, no masses, no thyromegaly.  No evidence of jugular venous distension.   Respiratory: Bibasilar rales noted.  No evidence of associated wheezing.  Increased respiratory effort noted without accessory muscle use.  Cardiovascular: Tachycardic rate with regular rhythm, no murmurs / rubs / gallops. No extremity edema. 2+ pedal pulses. No carotid bruits.  Chest:   Nontender without crepitus or deformity.   Back:   Nontender without crepitus or deformity. Abdomen: Abdomen is soft and nontender.  No evidence of intra-abdominal masses.  Positive bowel sounds noted in all quadrants.   Musculoskeletal: No joint deformity upper and lower extremities. Good ROM, no contractures. Normal muscle tone.  Neurologic: Patient is extremely lethargic and minimally arousable.  Patient is currently not oriented.  Patient does not seem to follow commands.  Sensation seems to be grossly intact.  Patient is responsive to verbal and painful stimuli.   Psychiatric: Unable to assess due to significant lethargy.  Currently patient does not seem to possess insight as to his current situation.  GU: Foley catheter in place draining cloudy urine.   Labs on Admission: I have personally reviewed following labs and imaging studies -   CBC: Recent Labs  Lab 11/05/20 1930 11/05/20 2007 11/06/20 0337  WBC 21.3*  --  31.0*  NEUTROABS 20.1*  --  29.4*  HGB 12.5* 12.9* 11.0*  HCT 39.1 38.0* 35.6*  MCV 94.7  --  95.4  PLT 529*  --  235*   Basic Metabolic Panel: Recent Labs  Lab 11/05/20 1930 11/05/20 2007 11/06/20 0337 11/06/20 0716  NA 134* 136 137 136  K 4.3 4.4 4.4 3.9  CL 102 104 107 109  CO2  14*  --  10* 14*  GLUCOSE 352* 363* 364* 293*  BUN 18 18 20 20   CREATININE 0.72 0.50* 0.89 0.77  CALCIUM 8.6*  --  8.7* 8.4*  MG  --   --  1.8  --    GFR: Estimated Creatinine Clearance: 100.6 mL/min (by C-G formula based on SCr of 0.77 mg/dL). Liver Function Tests: Recent Labs  Lab 11/05/20 1930 11/06/20 0337  AST 20 18  ALT 24 22  ALKPHOS 336* 303*  BILITOT 1.6* 1.9*  PROT 8.2* 7.8  ALBUMIN 2.8* 2.6*   No results for input(s): LIPASE, AMYLASE in the last 168 hours. No results for input(s): AMMONIA in the last 168 hours. Coagulation Profile: Recent Labs  Lab 11/05/20 1930 11/06/20 0337  INR 1.5* 1.6*   Cardiac Enzymes: No results for input(s): CKTOTAL, CKMB, CKMBINDEX, TROPONINI in the last 168 hours. BNP (last 3 results) No results for input(s): PROBNP in the last 8760 hours. HbA1C: No results for input(s): HGBA1C in the last 72 hours. CBG: Recent Labs  Lab 11/05/20 1753 11/06/20 0207 11/06/20 0748  GLUCAP 332* 326* 270*   Lipid Profile: No results for input(s): CHOL, HDL, LDLCALC, TRIG, CHOLHDL, LDLDIRECT in the last 72 hours. Thyroid Function Tests: No results for input(s): TSH, T4TOTAL, FREET4, T3FREE, THYROIDAB in the last 72 hours. Anemia Panel: No results for input(s): VITAMINB12, FOLATE, FERRITIN, TIBC, IRON, RETICCTPCT in the last 72 hours. Urine analysis:    Component Value Date/Time   COLORURINE YELLOW 11/05/2020 2038   APPEARANCEUR HAZY (A) 11/05/2020 2038   LABSPEC 1.027 11/05/2020 2038   PHURINE 5.0 11/05/2020 2038   GLUCOSEU >=500 (A) 11/05/2020 2038   HGBUR MODERATE (A) 11/05/2020 2038   BILIRUBINUR NEGATIVE 11/05/2020 2038   BILIRUBINUR 1+ 02/23/2016 1256   KETONESUR 80 (A) 11/05/2020 2038  PROTEINUR 30 (A) 11/05/2020 2038   UROBILINOGEN 1.0 02/23/2016 1256   NITRITE NEGATIVE 11/05/2020 2038   LEUKOCYTESUR TRACE (A) 11/05/2020 2038    Radiological Exams on Admission - Personally Reviewed: CT CHEST ABDOMEN PELVIS W  CONTRAST  Result Date: 11/06/2020 CLINICAL DATA:  Pyelonephritis.  Complicated sepsis. EXAM: CT CHEST, ABDOMEN, AND PELVIS WITH CONTRAST TECHNIQUE: Multidetector CT imaging of the chest, abdomen and pelvis was performed following the standard protocol during bolus administration of intravenous contrast. CONTRAST:  153mL OMNIPAQUE IOHEXOL 300 MG/ML  SOLN COMPARISON:  Outside abdominal CT 10/14/2020 FINDINGS: CT CHEST FINDINGS Cardiovascular: Normal heart size. No pericardial effusion. Coronary atherosclerosis. Limited opacification of the pulmonary arteries. Mediastinum/Nodes: Negative for adenopathy or mass. Lungs/Pleura: Airspace opacity in the subpleural left lower lobe and patchy reticulonodular density in the bilateral lower lobes. No edema or effusion. No pneumothorax. Musculoskeletal: No acute finding.  Mild scoliosis CT ABDOMEN PELVIS FINDINGS Hepatobiliary: Sequela of Whipple procedure.There is soft tissue density at the liver hilum better demonstrated on prior pancreas protocol CT with arterial narrowings described on that study. Intrahepatic bile duct dilatation which is similar to previous. Left lobe of the liver is atrophic in the setting of chronic left portal vein occlusion and hepatic arterial narrowings. There is a cluster of new low-density areas in the posterior right lobe liver with the largest collection measuring 4.6 cm. Mild hypo perfusion in the adjacent parenchyma. Pancreas: Status post Whipple procedure. Atrophy of the remaining pancreas. Soft tissue density around the celiac branches as described on outside CT staging. No acute pancreatitis is seen. Spleen: Negative Adrenals/Urinary Tract: Negative adrenals. No hydronephrosis or stone. Unremarkable bladder which is decompressed by a Foley catheter. Stomach/Bowel: No evidence of bowel obstruction. Sequela of local procedure. Rectal stool distention with mild mesorectal edema. The rectum measures up to 8 cm in diameter. Vascular/Lymphatic:  Celiac branch narrowings and chronic left portal vein occlusion as described above. Atheromatous calcification. No discrete retroperitoneal nodes. Reproductive:No pathologic findings. Other: No ascites or pneumoperitoneum. Musculoskeletal: Intertrochanteric right femur fracture with recent repair. No acute osseous finding. Lumbar spine degeneration and scoliosis. Message was sent to the clinical team via secure chat in epic. IMPRESSION: 1. Cluster of bilomas/abscess in the posterior right lobe liver with the largest component measuring up to 4.6 cm. Bile duct dilatation is similar to recent staging scan 08/31/2020. 2. Whipple procedure for pancreas carcinoma with infiltrative soft tissue about the narrowed celiac branches and hepatic hilum, reference dedicated staging scan at [facility] 08/31/2020. 3. No CT findings to correlate with history of pyelonephritis. 4. Airspace disease in the bilateral lower lobes compatible with pneumonia or aspiration. Notable subpleural involvement in the left lower lobe is likely consolidation given the overall pattern but please have low threshold for lower extremity Doppler ultrasound given recent right femur fracture repair. 5. Moderate stool distended rectum with mesorectal edema. Electronically Signed   By: Monte Fantasia M.D.   On: 11/06/2020 06:29   DG Chest Port 1 View  Result Date: 11/05/2020 CLINICAL DATA:  Sepsis. EXAM: PORTABLE CHEST 1 VIEW COMPARISON:  Chest x-ray 10/18/2020 FINDINGS: The cardiac silhouette, mediastinal and hilar contours are within normal limits. The lungs are clear of an acute process. No pulmonary lesions or pleural effusion. The bony thorax is intact. IMPRESSION: No acute cardiopulmonary findings. Electronically Signed   By: Marijo Sanes M.D.   On: 11/05/2020 18:53    EKG: Personally reviewed.  Rhythm is sinus tachycardia with heart rate of 124 bpm.  No dynamic ST segment  changes appreciated.  Assessment/Plan Principal Problem:    Sepsis, multifactorial secondary to suspected staph anginosis resulting in liver abscess as well as bilateral lower lobe pneumonia.   Patient was initially felt to be suffering from a persisting catheter associated urinary tract infection however upon CT imaging of the chest abdomen pelvis patient is found to have an abscess located in the posterior right lobe of the liver.  Patient was additionally found to have airspace disease of the bilateral lower lobes suggestive of pneumonia.  Staph anginosis is an organism that is well-known to cause liver abscesses and likely has additionally precipitated the pneumonia.  Patient is suffering from sepsis secondary to this as patient is exhibiting multiple SIRS criteria, metabolic encephalopathy and now diabetic ketoacidosis  Hydrating patient aggressively with intravenous isotonic fluids  Treating patient with broad-spectrum intravenous antibiotics for now with vancomycin, cefepime and Flagyl.  Blood cultures and urine cultures obtained.  I have placed an interventional radiology consultation for evaluation as to whether abscess can be drained  Infectious disease can be consulted on day shift  Transferring patient to stepdown unit.  Active Problems:   Uncontrolled type 2 diabetes mellitus with ketoacidosis without coma, with long-term current use of insulin (HCC)   Overnight the patient has developed progressively worsening metabolic acidosis with anion gap and markedly elevated beta hydroxybutyrate with persisting substantial hyperglycemia all concerning for developing diabetic ketoacidosis  This is likely been precipitated by underlying sepsis  Initiating insulin infusion and transferring to stepdown unit for continued management  Performing serial chemistries and serial beta hydroxybutyrate levels  Hydrating patient aggressively with intravenous isotonic fluids  Patient will remain n.p.o. until gap is closed    Essential  hypertension   Holding home regimen of oral antipretenses due to tenuous oral intake  As needed intravenous antihypertensives is for markedly low blood pressure.    Mixed diabetic hyperlipidemia associated with type 2 diabetes mellitus ([place])   Continuing home regimen of statin therapy if patient is able to tolerate oral intake    Adenocarcinoma of head of pancreas ([place])   Diagnosed in 2017  Status post chemotherapy and radiation at the time  Status post Whipple procedure  Now with recurrence of disease in recent months  Patient is expected to undergo palliative outpatient radiation therapy in the coming weeks.    Chronic diastolic CHF (congestive heart failure) (HCC)   No evidence of volume overload at this time    BPH with urinary obstruction   Foley catheter was placed during last hospitalization due to outlet obstruction and remains in place today.  Foley catheter has been exchanged during this hospitalization  Continuing home regimen of Flomax    Pressure ulcer of sacral region, stage 2 ([place])   Wound care consultation placed  Present on admission    GERD without esophagitis   Daily PPI   Code Status:  Full code Family Communication: Wife is at bedside who has been updated on plan of care.  Status is: Observation  The patient remains OBS appropriate and will d/c before 2 midnights.  Dispo: The patient is from: SNF              Anticipated d/c is to: SNF              Patient currently is not medically stable to d/c.   Difficult to place patient No        Vernelle Emerald MD [facility] Pager [phone]  If 7PM-7AM, please contact night-coverage [email or web address]  Use universal [place] password for that web site. If you do not have the password, please call the hospital operator.  11/06/2020, 7:57 AM

## 2020-11-05 NOTE — Progress Notes (Addendum)
Pharmacy Antibiotic Note  Colton Bush is a 65 y.o. male admitted on 11/05/2020 with history of diabetes, hypertension, hyperlipidemia, here presenting with altered mental status and fever Pharmacy has been consulted to dose cefepime for UTI. And vancomycin for sepsis. Received 1st dose in the ED  Plan: Cefepime 2gm IV q8h Vancomycin 1250mg  IV q12h (AUC 531.3, Scr 0.89) Follow renal function, cultures and clinical course  Height: 5\' 10"  (177.8 cm) Weight: 89 kg (196 lb 3.4 oz) IBW/kg (Calculated) : 73  Temp (24hrs), Avg:101.2 F (38.4 C), Min:99.5 F (37.5 C), Max:102.9 F (39.4 C)  Recent Labs  Lab 11/05/20 1914 11/05/20 1930 11/05/20 2007  WBC  --  21.3*  --   CREATININE  --  0.72 0.50*  LATICACIDVEN 1.6  --   --     Estimated Creatinine Clearance: 100.6 mL/min (A) (by C-G formula based on SCr of 0.5 mg/dL (L)).    No Known Allergies  Antimicrobials this admission: 4/8 cefepime >> 4/8 vanc >> 4/8 flagyl x 1 Dose adjustments this admission:   Microbiology results: 4/8 BCx:  4/8 UCx:   Thank you for allowing pharmacy to be a part of this patient's care.  Dolly Rias RPh 11/05/2020, 11:56 PM

## 2020-11-05 NOTE — ED Notes (Addendum)
ED TO INPATIENT HANDOFF REPORT  ED Nurse Name and Phone #:    S Name/Age/Gender Colton Bush 65 y.o. male Room/Bed: WA20/WA20  Code Status   Code Status: Prior  Home/SNF/Other Home  Is this baseline no - he is slurring words and confused, NV -   Triage Complete: Triage complete  Chief Complaint Sepsis secondary to UTI ([place]) [A41.9, N39.0]  Triage Note BIB EMS from [facility], per nursing staff they noticed a change in pt's mental status over the past day and his blood sugar kept changing, in the 400s, EMS got a CBG of 371. Facility said thye gave insulin w/o result. Also reported low oxygenation, 98% RA. Pulse in the 120s. Febrile.   Given 8mg  Zofran with EMS for nausea and dry heaves, 800 cc of NS.     Allergies No Known Allergies  Level of Care/Admitting Diagnosis ED Disposition    ED Disposition Condition Comment   Admit  Hospital Area: [facility] [[id number]]  Level of Care: Progressive [102]  Admit to Progressive based on following criteria: MULTISYSTEM THREATS such as stable sepsis, metabolic/electrolyte imbalance with or without encephalopathy that is responding to early treatment.  Covid Evaluation: Asymptomatic Screening Protocol (No Symptoms)  Diagnosis: Sepsis secondary to UTI [facility]) [[id number]]  Admitting Physician: Vernelle Emerald [[id number]]  Attending Physician: Vernelle Emerald [[id number]]       B Medical/Surgery History Past Medical History:  Diagnosis Date  . Arthritis    left hand  . Bronchitis 1977  . Cancer ([place]) 03/09/2016   pancreatic cancer, sees Dr. Cristino Martes at [facility]   . Depression    takes Cymbalta daily  . Diabetes mellitus type II    sees Dr. Chalmers Cater   . GERD (gastroesophageal reflux disease)    takes Omeprazole daily  . H/O hiatal hernia   . Hyperlipidemia    takes Zocor daily  . Hypertension    takes Amlodipine daily  . Hypoglycemia 06/18/2017  . Neck pain    C4-7 stenosis and herniated disc  .  Neuromuscular disorder ([place])    hiatal hernia  . Scoliosis    slight  . Spinal cord injury, C5-C7 ([place])    c4-c7  . Stiffness of hand joint    d/t cervical issues   Past Surgical History:  Procedure Laterality Date  . ANTERIOR CERVICAL DECOMP/DISCECTOMY FUSION  08/18/2011   Procedure: ANTERIOR CERVICAL DECOMPRESSION/DISCECTOMY FUSION 3 LEVELS;  Surgeon: Winfield Cunas, MD;  Location: [place] [facility];  Service: Neurosurgery;  Laterality: N/A;  Anterior Cervical Four-Five/Five-Six/Six-Seven Decompression with Fusion, Plating, and Bonegraft  . CARPAL TUNNEL RELEASE  2013   bilateral, per Dr. Christella Noa   . COLONOSCOPY  10-30-14   per Dr. Olevia Perches, clear, repeat in 10 yrs   . egd with esophageal dilation  9-08   per Dr. Olevia Perches  . ERCP N/A 03/01/2016   Procedure: ENDOSCOPIC RETROGRADE CHOLANGIOPANCREATOGRAPHY (ERCP) with brushings and stent;  Surgeon: Doran Stabler, MD;  Location: [facility];  Service: Endoscopy;  Laterality: N/A;  . ESOPHAGOGASTRODUODENOSCOPY (EGD) WITH PROPOFOL N/A 05/27/2020   Procedure: ESOPHAGOGASTRODUODENOSCOPY (EGD) WITH PROPOFOL;  Surgeon: Milus Banister, MD;  Location: [facility];  Service: Endoscopy;  Laterality: N/A;  . EUS N/A 03/09/2016   Procedure: ESOPHAGEAL ENDOSCOPIC ULTRASOUND (EUS) RADIAL;  Surgeon: Milus Banister, MD;  Location: [facility];  Service: Endoscopy;  Laterality: N/A;  . EUS N/A 05/27/2020   Procedure: UPPER ENDOSCOPIC ULTRASOUND (EUS) RADIAL;  Surgeon: Milus Banister, MD;  Location:  [facility];  Service: Endoscopy;  Laterality: N/A;  . FEMUR IM NAIL Right 10/25/2020   Procedure: INTRAMEDULLARY (IM) NAIL FEMORAL;  Surgeon: Rod Can, MD;  Location: [facility];  Service: Orthopedics;  Laterality: Right;  . lymph nodes biopsy    . melanoma rt calf  1999  . PORT-A-CATH REMOVAL N/A 04/03/2019   Procedure: PORT REMOVAL;  Surgeon: Stark Klein, MD;  Location: [place];  Service: General;  Laterality: N/A;  . PORTACATH PLACEMENT Left 03/22/2016    Procedure: INSERTION PORT-A-CATH;  Surgeon: Stark Klein, MD;  Location: [facility];  Service: General;  Laterality: Left;  . SPINE SURGERY    . TONSILLECTOMY     as a child  . ULNAR TUNNEL RELEASE  2013   right arm, per Dr. Christella Noa   . UPPER GASTROINTESTINAL ENDOSCOPY    . WHIPPLE PROCEDURE N/A 09/19/2016   Procedure: DIAGNOSTIC LAPAROSCOPY, LAPAROSCOPIC LIVER BIOPSY, RETROPERITONEAL EXPLORATION, INTRAOPERATIVE ULTRASOUND;  Surgeon: Stark Klein, MD;  Location: [place];  Service: General;  Laterality: N/A;     A IV Location/Drains/Wounds Patient Lines/Drains/Airways Status    Active Line/Drains/Airways    Name Placement date Placement time Site Days   Peripheral IV 11/05/20 Right Hand 11/05/20  --  Hand  less than 1   Peripheral IV 11/05/20 Right Forearm 11/05/20  2022  Forearm  less than 1   Urethral Catheter 16 Fr. 10/27/20  0930  --  9   Incision (Closed) 10/25/20 Hip Right 10/25/20  1832  -- 11          Intake/Output Last 24 hours No intake or output data in the 24 hours ending 11/05/20 2247  Labs/Imaging Results for orders placed or performed during the hospital encounter of 11/05/20 (from the past 48 hour(s))  CBG monitoring, ED     Status: Abnormal   Collection Time: 11/05/20  5:53 PM  Result Value Ref Range   Glucose-Capillary 332 (H) 70 - 99 mg/dL    Comment: Glucose reference range applies only to samples taken after fasting for at least 8 hours.   Comment 1 Notify RN   Resp Panel by RT-PCR (Flu A&B, Covid) Nasopharyngeal Swab     Status: None   Collection Time: 11/05/20  7:14 PM   Specimen: Nasopharyngeal Swab; Nasopharyngeal(NP) swabs in vial transport medium  Result Value Ref Range   SARS Coronavirus 2 by RT PCR NEGATIVE NEGATIVE    Comment: (NOTE) SARS-CoV-2 target nucleic acids are NOT DETECTED.  The SARS-CoV-2 RNA is generally detectable in upper respiratory specimens during the acute phase of infection. The lowest concentration of SARS-CoV-2 viral copies this  assay can detect is 138 copies/mL. A negative result does not preclude SARS-Cov-2 infection and should not be used as the sole basis for treatment or other patient management decisions. A negative result may occur with  improper specimen collection/handling, submission of specimen other than nasopharyngeal swab, presence of viral mutation(s) within the areas targeted by this assay, and inadequate number of viral copies(<138 copies/mL). A negative result must be combined with clinical observations, patient history, and epidemiological information. The expected result is Negative.  Fact Sheet for Patients:  EntrepreneurPulse.com.au  Fact Sheet for Healthcare Providers:  IncredibleEmployment.be  This test is no t yet approved or cleared by the [place] FDA and  has been authorized for detection and/or diagnosis of SARS-CoV-2 by FDA under an Emergency Use Authorization (EUA). This EUA will remain  in effect (meaning this test can be used) for the duration of the COVID-19  declaration under Section 564(b)(1) of the Act, 21 U.S.C.section 360bbb-3(b)(1), unless the authorization is terminated  or revoked sooner.       Influenza A by PCR NEGATIVE NEGATIVE   Influenza B by PCR NEGATIVE NEGATIVE    Comment: (NOTE) The Xpert Xpress SARS-CoV-2/FLU/RSV plus assay is intended as an aid in the diagnosis of influenza from Nasopharyngeal swab specimens and should not be used as a sole basis for treatment. Nasal washings and aspirates are unacceptable for Xpert Xpress SARS-CoV-2/FLU/RSV testing.  Fact Sheet for Patients: EntrepreneurPulse.com.au  Fact Sheet for Healthcare Providers: IncredibleEmployment.be  This test is not yet approved or cleared by the [place] FDA and has been authorized for detection and/or diagnosis of SARS-CoV-2 by FDA under an Emergency Use Authorization (EUA). This EUA will remain in  effect (meaning this test can be used) for the duration of the COVID-19 declaration under Section 564(b)(1) of the Act, 21 U.S.C. section 360bbb-3(b)(1), unless the authorization is terminated or revoked.  Performed at [facility], [place] [address] [place], [place] [address]   Lactic acid, plasma     Status: None   Collection Time: 11/05/20  7:14 PM  Result Value Ref Range   Lactic Acid, Venous 1.6 0.5 - 1.9 mmol/L    Comment: Performed at [facility], [place] [address], [place], [place] [address]  Comprehensive metabolic panel     Status: Abnormal   Collection Time: 11/05/20  7:30 PM  Result Value Ref Range   Sodium 134 (L) 135 - 145 mmol/L   Potassium 4.3 3.5 - 5.1 mmol/L   Chloride 102 98 - 111 mmol/L   CO2 14 (L) 22 - 32 mmol/L   Glucose, Bld 352 (H) 70 - 99 mg/dL    Comment: Glucose reference range applies only to samples taken after fasting for at least 8 hours.   BUN 18 8 - 23 mg/dL   Creatinine, Ser 0.72 0.61 - 1.24 mg/dL   Calcium 8.6 (L) 8.9 - 10.3 mg/dL   Total Protein 8.2 (H) 6.5 - 8.1 g/dL   Albumin 2.8 (L) 3.5 - 5.0 g/dL   AST 20 15 - 41 U/L   ALT 24 0 - 44 U/L   Alkaline Phosphatase 336 (H) 38 - 126 U/L   Total Bilirubin 1.6 (H) 0.3 - 1.2 mg/dL   GFR, Estimated >60 >60 mL/min    Comment: (NOTE) Calculated using the CKD-EPI Creatinine Equation (2021)    Anion gap 18 (H) 5 - 15    Comment: Performed at [facility], [place] [address], [place], [place] [address]  CBC WITH DIFFERENTIAL     Status: Abnormal   Collection Time: 11/05/20  7:30 PM  Result Value Ref Range   WBC 21.3 (H) 4.0 - 10.5 K/uL   RBC 4.13 (L) 4.22 - 5.81 MIL/uL   Hemoglobin 12.5 (L) 13.0 - 17.0 g/dL   HCT 39.1 39.0 - 52.0 %   MCV 94.7 80.0 - 100.0 fL   MCH 30.3 26.0 - 34.0 pg   MCHC 32.0 30.0 - 36.0 g/dL   RDW 14.8 11.5 - 15.5 %   Platelets 529 (H) 150 - 400 K/uL   nRBC 0.0 0.0 - 0.2 %   Neutrophils Relative % 95 %   Neutro Abs 20.1  (H) 1.7 - 7.7 K/uL   Lymphocytes Relative 2 %   Lymphs Abs 0.4 (L) 0.7 - 4.0 K/uL   Monocytes Relative 3 %   Monocytes Absolute 0.7 0.1 -  1.0 K/uL   Eosinophils Relative 0 %   Eosinophils Absolute 0.0 0.0 - 0.5 K/uL   Basophils Relative 0 %   Basophils Absolute 0.1 0.0 - 0.1 K/uL   Immature Granulocytes 0 %   Abs Immature Granulocytes 0.09 (H) 0.00 - 0.07 K/uL    Comment: Performed at [facility], [place] [address], [place], [place] [address]  Protime-INR     Status: Abnormal   Collection Time: 11/05/20  7:30 PM  Result Value Ref Range   Prothrombin Time 17.7 (H) 11.4 - 15.2 seconds   INR 1.5 (H) 0.8 - 1.2    Comment: (NOTE) INR goal varies based on device and disease states. Performed at [facility], [place] [address] [place], [place] [address]   APTT     Status: None   Collection Time: 11/05/20  7:30 PM  Result Value Ref Range   aPTT 32 24 - 36 seconds    Comment: Performed at [facility], [place] [address], [place], [place] [address]  I-stat chem 8, ED (not at [facility] or [facility])     Status: Abnormal   Collection Time: 11/05/20  8:07 PM  Result Value Ref Range   Sodium 136 135 - 145 mmol/L   Potassium 4.4 3.5 - 5.1 mmol/L   Chloride 104 98 - 111 mmol/L   BUN 18 8 - 23 mg/dL   Creatinine, Ser 0.50 (L) 0.61 - 1.24 mg/dL   Glucose, Bld 363 (H) 70 - 99 mg/dL    Comment: Glucose reference range applies only to samples taken after fasting for at least 8 hours.   Calcium, Ion 1.17 1.15 - 1.40 mmol/L   TCO2 17 (L) 22 - 32 mmol/L   Hemoglobin 12.9 (L) 13.0 - 17.0 g/dL   HCT 38.0 (L) 39.0 - 52.0 %  Urinalysis, Routine w reflex microscopic     Status: Abnormal   Collection Time: 11/05/20  8:38 PM  Result Value Ref Range   Color, Urine YELLOW YELLOW   APPearance HAZY (A) CLEAR   Specific Gravity, Urine 1.027 1.005 - 1.030   pH 5.0 5.0 - 8.0   Glucose, UA >=500 (A) NEGATIVE mg/dL   Hgb urine dipstick MODERATE (A) NEGATIVE    Bilirubin Urine NEGATIVE NEGATIVE   Ketones, ur 80 (A) NEGATIVE mg/dL   Protein, ur 30 (A) NEGATIVE mg/dL   Nitrite NEGATIVE NEGATIVE   Leukocytes,Ua TRACE (A) NEGATIVE   RBC / HPF 6-10 0 - 5 RBC/hpf   WBC, UA >50 (H) 0 - 5 WBC/hpf   Bacteria, UA RARE (A) NONE SEEN   Squamous Epithelial / LPF 0-5 0 - 5   Mucus PRESENT     Comment: Performed at [facility], [place] [address], [place], [place] [address]   DG Chest Port 1 View  Result Date: 11/05/2020 CLINICAL DATA:  Sepsis. EXAM: PORTABLE CHEST 1 VIEW COMPARISON:  Chest x-ray 10/18/2020 FINDINGS: The cardiac silhouette, mediastinal and hilar contours are within normal limits. The lungs are clear of an acute process. No pulmonary lesions or pleural effusion. The bony thorax is intact. IMPRESSION: No acute cardiopulmonary findings. Electronically Signed   By: Marijo Sanes M.D.   On: 11/05/2020 18:53    Pending Labs Unresulted Labs (From admission, onward)          Start     Ordered   11/05/20 1824  Lactic acid, plasma  (Septic presentation on arrival (screening labs, nursing and treatment orders for obvious sepsis))  Now then every 2 hours,   STAT      11/05/20 1824   11/05/20 1824  Blood Culture (routine x 2)  (Septic presentation on arrival (screening labs, nursing and treatment orders for obvious sepsis))  BLOOD CULTURE X 2,   STAT      11/05/20 1824   11/05/20 1824  Urine culture  (Septic presentation on arrival (screening labs, nursing and treatment orders for obvious sepsis))  ONCE - STAT,   STAT        11/05/20 1824          Vitals/Pain Today's Vitals   11/05/20 2100 11/05/20 2115 11/05/20 2202 11/05/20 2242  BP: 130/74 130/74 140/76 138/72  Pulse: (!) 124 (!) 124 (!) 124 (!) 128  Resp:  20 20 (!) 24  Temp:    99.5 F (37.5 C)  TempSrc:    Oral  SpO2: 94% 95% 96% 95%  Weight:      Height:      PainSc:    0-No pain    Isolation Precautions No active isolations  Medications Medications  lactated  ringers infusion ( Intravenous New Bag/Given 11/05/20 2019)  sodium chloride 0.9 % bolus 1,000 mL (0 mLs Intravenous Stopped 11/05/20 2140)    And  sodium chloride 0.9 % bolus 1,000 mL (0 mLs Intravenous Stopped 11/05/20 2140)    And  sodium chloride 0.9 % bolus 1,000 mL (0 mLs Intravenous Stopped 11/05/20 2140)  ceFEPIme (MAXIPIME) 2 g in sodium chloride 0.9 % 100 mL IVPB (0 g Intravenous Stopped 11/05/20 2103)  metroNIDAZOLE (FLAGYL) IVPB 500 mg (0 mg Intravenous Stopped 11/05/20 2140)  acetaminophen (TYLENOL) tablet 650 mg (650 mg Oral Given 11/05/20 2140)  vancomycin (VANCOREADY) IVPB 1500 mg/300 mL (1,500 mg Intravenous New Bag/Given 11/05/20 2037)  ondansetron (ZOFRAN) injection 4 mg (4 mg Intravenous Given 11/05/20 2226)    Mobility non-ambulatory High fall risk   Focused Assessments Pulmonary Assessment Handoff:  Lung sounds:   O2 Device: Room Air        R Recommendations: See Admitting Provider Note  Report given to:   Additional Notes:  Pt is confused, inability to communicate with wife today, fever and nausea. Unable to assess walking. From facility. Hr in 128. 4 bags given nacl/ lr going currently.

## 2020-11-05 NOTE — ED Triage Notes (Signed)
BIB EMS from [facility], per nursing staff they noticed a change in pt's mental status over the past day and his blood sugar kept changing, in the 400s, EMS got a CBG of 371. Facility said thye gave insulin w/o result. Also reported low oxygenation, 98% RA. Pulse in the 120s. Febrile.   Given 8mg  Zofran with EMS for nausea and dry heaves, 800 cc of NS.

## 2020-11-05 NOTE — Sepsis Progress Note (Signed)
Confirmed with bedside RN that blood cultures were collected before administration of antibiotics.

## 2020-11-05 NOTE — Sepsis Progress Note (Signed)
Following for Code Sepsis  

## 2020-11-05 NOTE — ED Provider Notes (Signed)
[facility] DEPT Provider Note   CSN: [id number] Arrival date & time: 11/05/20  1736     History No chief complaint on file.   Colton Bush is a 65 y.o. male history of diabetes, hypertension, hyperlipidemia, here presenting with altered mental status and fever.  Patient is from [facility] home.  Patient was noted to be altered.  His glucose is also elevated around 400.  Patient was noted to be febrile but no temperature was documented.  Patient was also noted to be tachycardic as well.  Patient is not able to give me much history at all.  Patient appears to be bedbound and unable to give much meaningful history.  Patient is postop day #11 status post right hip surgery done by Dr. Lyla Glassing on 3/28.   The history is provided by the patient, the nursing home and the EMS personnel.  Level V caveat- dementia      Past Medical History:  Diagnosis Date  . Arthritis    left hand  . Bronchitis 1977  . Cancer ([place]) 03/09/2016   pancreatic cancer, sees Dr. Cristino Martes at [facility]   . Depression    takes Cymbalta daily  . Diabetes mellitus type II    sees Dr. Chalmers Cater   . GERD (gastroesophageal reflux disease)    takes Omeprazole daily  . H/O hiatal hernia   . Hyperlipidemia    takes Zocor daily  . Hypertension    takes Amlodipine daily  . Hypoglycemia 06/18/2017  . Neck pain    C4-7 stenosis and herniated disc  . Neuromuscular disorder ([place])    hiatal hernia  . Scoliosis    slight  . Spinal cord injury, C5-C7 ([place])    c4-c7  . Stiffness of hand joint    d/t cervical issues    Patient Active Problem List   Diagnosis Date Noted  . Closed right hip fracture, initial encounter ([place]) 10/18/2020  . Fall from ground level 10/18/2020  . Hypoglycemia 06/19/2017  . Hypothermia 06/19/2017  . Goals of care, counseling/discussion 09/29/2016  . Port catheter in place 04/11/2016  . Hypercalcemia 03/29/2016  . Adenocarcinoma of head of pancreas ([place])  03/17/2016  . Biliary obstruction   . Obstructive jaundice due to malignant neoplasm ([place]) 02/27/2016  . DKA (diabetic ketoacidoses) 02/27/2016  . Type 2 diabetes mellitus with other specified complication ([place]) 45/80/9983  . Cervical spondylosis with myelopathy 08/18/2011  . CERUMEN IMPACTION 12/14/2008  . Hyperlipidemia, mixed 09/16/2007  . Essential hypertension 09/16/2007  . GERD 09/16/2007  . ESOPHAGEAL STRICTURE 04/04/2007  . HIATAL HERNIA 04/04/2007    Past Surgical History:  Procedure Laterality Date  . ANTERIOR CERVICAL DECOMP/DISCECTOMY FUSION  08/18/2011   Procedure: ANTERIOR CERVICAL DECOMPRESSION/DISCECTOMY FUSION 3 LEVELS;  Surgeon: Winfield Cunas, MD;  Location: [facility];  Service: Neurosurgery;  Laterality: N/A;  Anterior Cervical Four-Five/Five-Six/Six-Seven Decompression with Fusion, Plating, and Bonegraft  . CARPAL TUNNEL RELEASE  2013   bilateral, per Dr. Christella Noa   . COLONOSCOPY  10-30-14   per Dr. Olevia Perches, clear, repeat in 10 yrs   . egd with esophageal dilation  9-08   per Dr. Olevia Perches  . ERCP N/A 03/01/2016   Procedure: ENDOSCOPIC RETROGRADE CHOLANGIOPANCREATOGRAPHY (ERCP) with brushings and stent;  Surgeon: Doran Stabler, MD;  Location: [facility];  Service: Endoscopy;  Laterality: N/A;  . ESOPHAGOGASTRODUODENOSCOPY (EGD) WITH PROPOFOL N/A 05/27/2020   Procedure: ESOPHAGOGASTRODUODENOSCOPY (EGD) WITH PROPOFOL;  Surgeon: Milus Banister, MD;  Location: [facility];  Service: Endoscopy;  Laterality: N/A;  . EUS N/A 03/09/2016   Procedure: ESOPHAGEAL ENDOSCOPIC ULTRASOUND (EUS) RADIAL;  Surgeon: Milus Banister, MD;  Location: [facility];  Service: Endoscopy;  Laterality: N/A;  . EUS N/A 05/27/2020   Procedure: UPPER ENDOSCOPIC ULTRASOUND (EUS) RADIAL;  Surgeon: Milus Banister, MD;  Location: [facility];  Service: Endoscopy;  Laterality: N/A;  . FEMUR IM NAIL Right 10/25/2020   Procedure: INTRAMEDULLARY (IM) NAIL FEMORAL;  Surgeon: Rod Can, MD;   Location: [facility];  Service: Orthopedics;  Laterality: Right;  . lymph nodes biopsy    . melanoma rt calf  1999  . PORT-A-CATH REMOVAL N/A 04/03/2019   Procedure: PORT REMOVAL;  Surgeon: Stark Klein, MD;  Location: [facility];  Service: General;  Laterality: N/A;  . PORTACATH PLACEMENT Left 03/22/2016   Procedure: INSERTION PORT-A-CATH;  Surgeon: Stark Klein, MD;  Location: [facility];  Service: General;  Laterality: Left;  . SPINE SURGERY    . TONSILLECTOMY     as a child  . ULNAR TUNNEL RELEASE  2013   right arm, per Dr. Christella Noa   . UPPER GASTROINTESTINAL ENDOSCOPY    . WHIPPLE PROCEDURE N/A 09/19/2016   Procedure: DIAGNOSTIC LAPAROSCOPY, LAPAROSCOPIC LIVER BIOPSY, RETROPERITONEAL EXPLORATION, INTRAOPERATIVE ULTRASOUND;  Surgeon: Stark Klein, MD;  Location: [facility];  Service: General;  Laterality: N/A;       Family History  Problem Relation Age of Onset  . Heart disease Father   . Heart disease Brother 103  . Anesthesia problems Mother   . Heart disease Mother   . Dementia Mother   . Diabetes Sister   . Stroke Sister   . Colon cancer Neg Hx   . Rectal cancer Neg Hx   . Stomach cancer Neg Hx     Social History   Tobacco Use  . Smoking status: Never Smoker  . Smokeless tobacco: Never Used  . Tobacco comment: tried a pipe 35 years ago   Vaping Use  . Vaping Use: Never used  Substance Use Topics  . Alcohol use: No    Alcohol/week: 0.0 standard drinks  . Drug use: No    Home Medications Prior to Admission medications   Medication Sig Start Date End Date Taking? Authorizing Provider  acetaminophen (TYLENOL) 500 MG tablet Take 1,000 mg by mouth every 6 (six) hours as needed for moderate pain or headache.    [provider]  amLODipine (NORVASC) 10 MG tablet Take 1 tablet (10 mg total) by mouth daily. 01/13/20   [name], MD  apixaban (ELIQUIS) 2.5 MG TABS tablet Take 1 tablet (2.5 mg total) by mouth 2 (two) times daily. 10/27/20 11/26/20  Cherlynn June B, PA  B-D UF  III MINI PEN NEEDLES 31G X 5 MM MISC Inject into the skin. 07/12/20   [provider]  benazepril (LOTENSIN) 10 MG tablet TAKE 1 TABLET DAILY Patient taking differently: Take 10 mg by mouth daily. 10/04/20   [name], MD  CREON 6000-19000 units CPEP Take 1 capsule by mouth with breakfast, with lunch, and with evening meal.  04/15/20   [provider]  diphenhydrAMINE HCl, Sleep, 25 MG TBDP Take 25 mg by mouth at bedtime as needed (sleep).    [provider]  docusate sodium (COLACE) 100 MG capsule Take 1 capsule (100 mg total) by mouth 2 (two) times daily. 10/28/20   [place] Donnamarie Poag, DO  ferrous sulfate 325 (65 FE) MG tablet Take 325 mg daily with breakfast by  mouth.    [provider]  FREESTYLE LITE test strip USE ONE STRIP TO CHECK GLUCOSE ONCE DAILY. PLEASE  SCHEDULE FOLLOW UP 11/09/16   [name], MD  insulin detemir (LEVEMIR) 100 UNIT/ML FlexPen Inject 25-35 Units into the skin See admin instructions. Inject 35 units in the morning and 25 units at night 10/28/20   [place] Donnamarie Poag, DO  Insulin Pen Needle 30G X 5 MM MISC Use one daily with insulin 03/04/16   Copland, Gay Filler, MD  Lancets (FREESTYLE) lancets USE AS INSTRUCTED TO CHECK BLOOD SUGAR ONCE A DAY 06/02/16   [name], MD  loratadine (CLARITIN) 10 MG tablet Take 10 mg by mouth daily.    [provider]  Multiple Vitamin (MULTIVITAMIN WITH MINERALS) TABS tablet Take 1 tablet by mouth daily.    [provider]  NOVOLOG FLEXPEN 100 UNIT/ML FlexPen GIVE EVERY MORNING WITH BREAKFAST AND EVERY EVENING WITH SUPPER PER SLIDING SCALE Patient taking differently: Inject 3-12 Units into the skin 3 (three) times daily with meals. 04/17/17   [name], MD  Omega-3 Fatty Acids (FISH OIL) 500 MG CAPS Take 500 mg by mouth daily.    [provider]  omeprazole (PRILOSEC) 20 MG capsule TAKE 1 CAPSULE DAILY Patient taking differently: Take 20 mg by mouth daily. 12/02/19   [name],  MD  potassium chloride SA (KLOR-CON M20) 20 MEQ tablet Take 1 tablet (20 mEq total) by mouth daily. 07/22/20   [name], MD  rosuvastatin (CRESTOR) 20 MG tablet Take 1 tablet (20 mg total) by mouth daily. 07/22/20   [name], MD  tamsulosin (FLOMAX) 0.4 MG CAPS capsule Take 1 capsule (0.4 mg total) by mouth daily. 10/29/20   [place] Eric J, DO    Allergies    Patient has no known allergies.  Review of Systems   Review of Systems  Constitutional: Positive for chills and fever.  Neurological: Positive for weakness.  All other systems reviewed and are negative.   Physical Exam Updated Vital Signs BP 140/76   Pulse (!) 124   Temp (!) 102.9 F (39.4 C) (Rectal)   Resp 20   Ht 5\' 10"  (1.778 m)   Wt 89 kg   SpO2 96%   BMI 28.15 kg/m   Physical Exam Vitals and nursing note reviewed.  Constitutional:      Comments: Chronically ill-appearing  HENT:     Head: Normocephalic.     Nose: Nose normal.     Mouth/Throat:     Mouth: Mucous membranes are dry.  Eyes:     Extraocular Movements: Extraocular movements intact.     Pupils: Pupils are equal, round, and reactive to light.  Cardiovascular:     Rate and Rhythm: Regular rhythm. Tachycardia present.     Pulses: Normal pulses.     Heart sounds: Normal heart sounds.  Pulmonary:     Effort: Pulmonary effort is normal.     Breath sounds: Normal breath sounds.  Abdominal:     General: Abdomen is flat.     Palpations: Abdomen is soft.  Musculoskeletal:        General: Normal range of motion.     Cervical back: Normal range of motion and neck supple.     Comments: Right hip staples still in place and no signs of redness or wound infection.  Patient does have stage II sacral decub ulcers.  Skin:    General: Skin is warm.     Capillary  Refill: Capillary refill takes less than 2 seconds.  Neurological:     General: No focal deficit present.     Mental Status: He is oriented to person, place, and time.  Psychiatric:         Mood and Affect: Mood normal.        Behavior: Behavior normal.     ED Results / Procedures / Treatments   Labs (all labs ordered are listed, but only abnormal results are displayed) Labs Reviewed  COMPREHENSIVE METABOLIC PANEL - Abnormal; Notable for the following components:      Result Value   Sodium 134 (*)    CO2 14 (*)    Glucose, Bld 352 (*)    Calcium 8.6 (*)    Total Protein 8.2 (*)    Albumin 2.8 (*)    Alkaline Phosphatase 336 (*)    Total Bilirubin 1.6 (*)    Anion gap 18 (*)    All other components within normal limits  CBC WITH DIFFERENTIAL/PLATELET - Abnormal; Notable for the following components:   WBC 21.3 (*)    RBC 4.13 (*)    Hemoglobin 12.5 (*)    Platelets 529 (*)    Neutro Abs 20.1 (*)    Lymphs Abs 0.4 (*)    Abs Immature Granulocytes 0.09 (*)    All other components within normal limits  PROTIME-INR - Abnormal; Notable for the following components:   Prothrombin Time 17.7 (*)    INR 1.5 (*)    All other components within normal limits  URINALYSIS, ROUTINE W REFLEX MICROSCOPIC - Abnormal; Notable for the following components:   APPearance HAZY (*)    Glucose, UA >=500 (*)    Hgb urine dipstick MODERATE (*)    Ketones, ur 80 (*)    Protein, ur 30 (*)    Leukocytes,Ua TRACE (*)    WBC, UA >50 (*)    Bacteria, UA RARE (*)    All other components within normal limits  CBG MONITORING, ED - Abnormal; Notable for the following components:   Glucose-Capillary 332 (*)    All other components within normal limits  I-STAT CHEM 8, ED - Abnormal; Notable for the following components:   Creatinine, Ser 0.50 (*)    Glucose, Bld 363 (*)    TCO2 17 (*)    Hemoglobin 12.9 (*)    HCT 38.0 (*)    All other components within normal limits  RESP PANEL BY RT-PCR (FLU A&B, COVID) ARPGX2  CULTURE, BLOOD (ROUTINE X 2)  CULTURE, BLOOD (ROUTINE X 2)  URINE CULTURE  LACTIC ACID, PLASMA  APTT  LACTIC ACID, PLASMA    EKG EKG  Interpretation  Date/Time:  Friday November 05 2020 17:51:47 EDT Ventricular Rate:  124 PR Interval:  162 QRS Duration: 89 QT Interval:  314 QTC Calculation: 451 R Axis:   68 Text Interpretation: Sinus tachycardia Probable left atrial enlargement Borderline repolarization abnormality No significant change since Confirmed by Wandra Arthurs [phone]) on 11/05/2020 6:26:50 PM   Radiology DG Chest Port 1 View  Result Date: 11/05/2020 CLINICAL DATA:  Sepsis. EXAM: PORTABLE CHEST 1 VIEW COMPARISON:  Chest x-ray 10/18/2020 FINDINGS: The cardiac silhouette, mediastinal and hilar contours are within normal limits. The lungs are clear of an acute process. No pulmonary lesions or pleural effusion. The bony thorax is intact. IMPRESSION: No acute cardiopulmonary findings. Electronically Signed   By: Marijo Sanes M.D.   On: 11/05/2020 18:53    Procedures Procedures   CRITICAL CARE  Performed by: Wandra Arthurs   Total critical care time: 30 minutes  Critical care time was exclusive of separately billable procedures and treating other patients.  Critical care was necessary to treat or prevent imminent or life-threatening deterioration.  Critical care was time spent personally by me on the following activities: development of treatment plan with patient and/or surrogate as well as nursing, discussions with consultants, evaluation of patient's response to treatment, examination of patient, obtaining history from patient or surrogate, ordering and performing treatments and interventions, ordering and review of laboratory studies, ordering and review of radiographic studies, pulse oximetry and re-evaluation of patient's condition.   Medications Ordered in ED Medications  lactated ringers infusion ( Intravenous New Bag/Given 11/05/20 2019)  vancomycin (VANCOREADY) IVPB 1500 mg/300 mL (1,500 mg Intravenous New Bag/Given 11/05/20 2037)  sodium chloride 0.9 % bolus 1,000 mL (0 mLs Intravenous Stopped 11/05/20 2140)     And  sodium chloride 0.9 % bolus 1,000 mL (0 mLs Intravenous Stopped 11/05/20 2140)    And  sodium chloride 0.9 % bolus 1,000 mL (0 mLs Intravenous Stopped 11/05/20 2140)  ceFEPIme (MAXIPIME) 2 g in sodium chloride 0.9 % 100 mL IVPB (0 g Intravenous Stopped 11/05/20 2103)  metroNIDAZOLE (FLAGYL) IVPB 500 mg (0 mg Intravenous Stopped 11/05/20 2140)  acetaminophen (TYLENOL) tablet 650 mg (650 mg Oral Given 11/05/20 2140)  ondansetron (ZOFRAN) injection 4 mg (4 mg Intravenous Given 11/05/20 2226)    ED Course  I have reviewed the triage vital signs and the nursing notes.  Pertinent labs & imaging results that were available during my care of the patient were reviewed by me and considered in my medical decision making (see chart for details).    MDM Rules/Calculators/A&P                         CAMARI WISHAM is a 65 y.o. male here presenting with altered mental status.  Patient is febrile and tachycardic.  He does have a Foley catheter so consider UTI versus Covid versus pneumonia.  Will initiate code sepsis and give broad-spectrum antibiotics and 30 cc/kg bolus.  10:28 PM WBC is 21.  Patient has anion gap of 16.  UA showed UTI.  Patient's chest x-ray is clear.  Covid test is negative.  Sepsis reassessment done.  Patient's heart rate still 120 and he still has poor mental status.  His blood pressure is normal.  I discussed with Dr. Onnie Graham from Ortho.  He states that since there is no signs of wound infection, patient can be treated for UTI and can keep his follow-up next week.  Sepsis - Repeat Assessment  Performed at:    10:30 pm  Vitals     Blood pressure 140/76, pulse (!) 124, temperature (!) 102.9 F (39.4 C), temperature source Rectal, resp. rate 20, height 5\' 10"  (1.778 m), weight 89 kg, SpO2 96 %.  Heart:     Tachycardic  Lungs:    CTA  Capillary Refill:   <2 sec  Peripheral Pulse:   Radial pulse palpable and Dorsalis pedis pulse  palpable  Skin:     Normal Color     Final Clinical  Impression(s) / ED Diagnoses Final diagnoses:  None    Rx / DC Orders ED Discharge Orders    None       Drenda Freeze, MD 11/05/20 2230

## 2020-11-06 ENCOUNTER — Observation Stay (HOSPITAL_COMMUNITY): Payer: Medicare Other

## 2020-11-06 ENCOUNTER — Inpatient Hospital Stay (HOSPITAL_COMMUNITY): Payer: Medicare Other

## 2020-11-06 ENCOUNTER — Other Ambulatory Visit: Payer: Self-pay

## 2020-11-06 ENCOUNTER — Encounter (HOSPITAL_COMMUNITY): Payer: Self-pay | Admitting: Internal Medicine

## 2020-11-06 DIAGNOSIS — K750 Abscess of liver: Secondary | ICD-10-CM

## 2020-11-06 DIAGNOSIS — J984 Other disorders of lung: Secondary | ICD-10-CM | POA: Diagnosis not present

## 2020-11-06 DIAGNOSIS — G4089 Other seizures: Secondary | ICD-10-CM | POA: Diagnosis not present

## 2020-11-06 DIAGNOSIS — L89152 Pressure ulcer of sacral region, stage 2: Secondary | ICD-10-CM | POA: Diagnosis not present

## 2020-11-06 DIAGNOSIS — N401 Enlarged prostate with lower urinary tract symptoms: Secondary | ICD-10-CM

## 2020-11-06 DIAGNOSIS — F32A Depression, unspecified: Secondary | ICD-10-CM | POA: Diagnosis present

## 2020-11-06 DIAGNOSIS — A419 Sepsis, unspecified organism: Secondary | ICD-10-CM | POA: Diagnosis not present

## 2020-11-06 DIAGNOSIS — I5032 Chronic diastolic (congestive) heart failure: Secondary | ICD-10-CM

## 2020-11-06 DIAGNOSIS — I6203 Nontraumatic chronic subdural hemorrhage: Secondary | ICD-10-CM | POA: Diagnosis not present

## 2020-11-06 DIAGNOSIS — R0600 Dyspnea, unspecified: Secondary | ICD-10-CM | POA: Diagnosis not present

## 2020-11-06 DIAGNOSIS — E876 Hypokalemia: Secondary | ICD-10-CM | POA: Diagnosis not present

## 2020-11-06 DIAGNOSIS — C250 Malignant neoplasm of head of pancreas: Secondary | ICD-10-CM

## 2020-11-06 DIAGNOSIS — I6200 Nontraumatic subdural hemorrhage, unspecified: Secondary | ICD-10-CM | POA: Diagnosis not present

## 2020-11-06 DIAGNOSIS — G929 Unspecified toxic encephalopathy: Secondary | ICD-10-CM | POA: Diagnosis not present

## 2020-11-06 DIAGNOSIS — K219 Gastro-esophageal reflux disease without esophagitis: Secondary | ICD-10-CM | POA: Diagnosis not present

## 2020-11-06 DIAGNOSIS — N390 Urinary tract infection, site not specified: Secondary | ICD-10-CM | POA: Diagnosis present

## 2020-11-06 DIAGNOSIS — F0390 Unspecified dementia without behavioral disturbance: Secondary | ICD-10-CM | POA: Diagnosis present

## 2020-11-06 DIAGNOSIS — E785 Hyperlipidemia, unspecified: Secondary | ICD-10-CM | POA: Diagnosis present

## 2020-11-06 DIAGNOSIS — C259 Malignant neoplasm of pancreas, unspecified: Secondary | ICD-10-CM | POA: Diagnosis not present

## 2020-11-06 DIAGNOSIS — E782 Mixed hyperlipidemia: Secondary | ICD-10-CM | POA: Diagnosis not present

## 2020-11-06 DIAGNOSIS — Z1621 Resistance to vancomycin: Secondary | ICD-10-CM | POA: Diagnosis present

## 2020-11-06 DIAGNOSIS — B954 Other streptococcus as the cause of diseases classified elsewhere: Secondary | ICD-10-CM | POA: Diagnosis not present

## 2020-11-06 DIAGNOSIS — J869 Pyothorax without fistula: Secondary | ICD-10-CM | POA: Diagnosis not present

## 2020-11-06 DIAGNOSIS — N138 Other obstructive and reflux uropathy: Secondary | ICD-10-CM | POA: Diagnosis present

## 2020-11-06 DIAGNOSIS — G9340 Encephalopathy, unspecified: Secondary | ICD-10-CM

## 2020-11-06 DIAGNOSIS — T83518A Infection and inflammatory reaction due to other urinary catheter, initial encounter: Secondary | ICD-10-CM | POA: Diagnosis not present

## 2020-11-06 DIAGNOSIS — R112 Nausea with vomiting, unspecified: Secondary | ICD-10-CM | POA: Diagnosis not present

## 2020-11-06 DIAGNOSIS — R338 Other retention of urine: Secondary | ICD-10-CM | POA: Diagnosis present

## 2020-11-06 DIAGNOSIS — M62838 Other muscle spasm: Secondary | ICD-10-CM | POA: Diagnosis present

## 2020-11-06 DIAGNOSIS — K8689 Other specified diseases of pancreas: Secondary | ICD-10-CM | POA: Diagnosis not present

## 2020-11-06 DIAGNOSIS — G9389 Other specified disorders of brain: Secondary | ICD-10-CM | POA: Diagnosis not present

## 2020-11-06 DIAGNOSIS — S065X9D Traumatic subdural hemorrhage with loss of consciousness of unspecified duration, subsequent encounter: Secondary | ICD-10-CM | POA: Diagnosis present

## 2020-11-06 DIAGNOSIS — R6520 Severe sepsis without septic shock: Secondary | ICD-10-CM | POA: Diagnosis not present

## 2020-11-06 DIAGNOSIS — S72001D Fracture of unspecified part of neck of right femur, subsequent encounter for closed fracture with routine healing: Secondary | ICD-10-CM | POA: Diagnosis not present

## 2020-11-06 DIAGNOSIS — I10 Essential (primary) hypertension: Secondary | ICD-10-CM | POA: Diagnosis not present

## 2020-11-06 DIAGNOSIS — I110 Hypertensive heart disease with heart failure: Secondary | ICD-10-CM | POA: Diagnosis not present

## 2020-11-06 DIAGNOSIS — Z9181 History of falling: Secondary | ICD-10-CM | POA: Diagnosis not present

## 2020-11-06 DIAGNOSIS — R339 Retention of urine, unspecified: Secondary | ICD-10-CM | POA: Diagnosis present

## 2020-11-06 DIAGNOSIS — S065X0A Traumatic subdural hemorrhage without loss of consciousness, initial encounter: Secondary | ICD-10-CM | POA: Diagnosis not present

## 2020-11-06 DIAGNOSIS — M545 Low back pain, unspecified: Secondary | ICD-10-CM | POA: Diagnosis present

## 2020-11-06 DIAGNOSIS — R7881 Bacteremia: Secondary | ICD-10-CM

## 2020-11-06 DIAGNOSIS — Z794 Long term (current) use of insulin: Secondary | ICD-10-CM | POA: Diagnosis not present

## 2020-11-06 DIAGNOSIS — R4182 Altered mental status, unspecified: Secondary | ICD-10-CM | POA: Diagnosis not present

## 2020-11-06 DIAGNOSIS — E162 Hypoglycemia, unspecified: Secondary | ICD-10-CM | POA: Diagnosis not present

## 2020-11-06 DIAGNOSIS — B955 Unspecified streptococcus as the cause of diseases classified elsewhere: Secondary | ICD-10-CM

## 2020-11-06 DIAGNOSIS — K449 Diaphragmatic hernia without obstruction or gangrene: Secondary | ICD-10-CM | POA: Diagnosis present

## 2020-11-06 DIAGNOSIS — M419 Scoliosis, unspecified: Secondary | ICD-10-CM | POA: Diagnosis present

## 2020-11-06 DIAGNOSIS — J90 Pleural effusion, not elsewhere classified: Secondary | ICD-10-CM | POA: Diagnosis not present

## 2020-11-06 DIAGNOSIS — N320 Bladder-neck obstruction: Secondary | ICD-10-CM | POA: Diagnosis not present

## 2020-11-06 DIAGNOSIS — E119 Type 2 diabetes mellitus without complications: Secondary | ICD-10-CM | POA: Diagnosis present

## 2020-11-06 DIAGNOSIS — E1169 Type 2 diabetes mellitus with other specified complication: Secondary | ICD-10-CM | POA: Diagnosis not present

## 2020-11-06 DIAGNOSIS — L89611 Pressure ulcer of right heel, stage 1: Secondary | ICD-10-CM | POA: Diagnosis present

## 2020-11-06 DIAGNOSIS — A408 Other streptococcal sepsis: Secondary | ICD-10-CM | POA: Diagnosis not present

## 2020-11-06 DIAGNOSIS — R918 Other nonspecific abnormal finding of lung field: Secondary | ICD-10-CM | POA: Diagnosis not present

## 2020-11-06 DIAGNOSIS — S066X9D Traumatic subarachnoid hemorrhage with loss of consciousness of unspecified duration, subsequent encounter: Secondary | ICD-10-CM | POA: Diagnosis not present

## 2020-11-06 DIAGNOSIS — R4701 Aphasia: Secondary | ICD-10-CM | POA: Diagnosis present

## 2020-11-06 DIAGNOSIS — J189 Pneumonia, unspecified organism: Secondary | ICD-10-CM | POA: Diagnosis not present

## 2020-11-06 DIAGNOSIS — L0291 Cutaneous abscess, unspecified: Secondary | ICD-10-CM | POA: Diagnosis not present

## 2020-11-06 DIAGNOSIS — E1110 Type 2 diabetes mellitus with ketoacidosis without coma: Secondary | ICD-10-CM | POA: Diagnosis not present

## 2020-11-06 DIAGNOSIS — Z20822 Contact with and (suspected) exposure to covid-19: Secondary | ICD-10-CM | POA: Diagnosis not present

## 2020-11-06 DIAGNOSIS — Z978 Presence of other specified devices: Secondary | ICD-10-CM | POA: Diagnosis not present

## 2020-11-06 DIAGNOSIS — R52 Pain, unspecified: Secondary | ICD-10-CM | POA: Diagnosis not present

## 2020-11-06 DIAGNOSIS — W19XXXD Unspecified fall, subsequent encounter: Secondary | ICD-10-CM | POA: Diagnosis present

## 2020-11-06 DIAGNOSIS — S065X9A Traumatic subdural hemorrhage with loss of consciousness of unspecified duration, initial encounter: Secondary | ICD-10-CM | POA: Diagnosis not present

## 2020-11-06 DIAGNOSIS — G928 Other toxic encephalopathy: Secondary | ICD-10-CM | POA: Diagnosis present

## 2020-11-06 DIAGNOSIS — K222 Esophageal obstruction: Secondary | ICD-10-CM | POA: Diagnosis present

## 2020-11-06 DIAGNOSIS — E11649 Type 2 diabetes mellitus with hypoglycemia without coma: Secondary | ICD-10-CM | POA: Diagnosis not present

## 2020-11-06 DIAGNOSIS — R569 Unspecified convulsions: Secondary | ICD-10-CM | POA: Diagnosis not present

## 2020-11-06 DIAGNOSIS — E872 Acidosis: Secondary | ICD-10-CM | POA: Diagnosis not present

## 2020-11-06 DIAGNOSIS — K838 Other specified diseases of biliary tract: Secondary | ICD-10-CM | POA: Diagnosis not present

## 2020-11-06 LAB — GLUCOSE, CAPILLARY
Glucose-Capillary: 119 mg/dL — ABNORMAL HIGH (ref 70–99)
Glucose-Capillary: 132 mg/dL — ABNORMAL HIGH (ref 70–99)
Glucose-Capillary: 139 mg/dL — ABNORMAL HIGH (ref 70–99)
Glucose-Capillary: 139 mg/dL — ABNORMAL HIGH (ref 70–99)
Glucose-Capillary: 145 mg/dL — ABNORMAL HIGH (ref 70–99)
Glucose-Capillary: 162 mg/dL — ABNORMAL HIGH (ref 70–99)
Glucose-Capillary: 165 mg/dL — ABNORMAL HIGH (ref 70–99)
Glucose-Capillary: 192 mg/dL — ABNORMAL HIGH (ref 70–99)
Glucose-Capillary: 256 mg/dL — ABNORMAL HIGH (ref 70–99)
Glucose-Capillary: 270 mg/dL — ABNORMAL HIGH (ref 70–99)
Glucose-Capillary: 326 mg/dL — ABNORMAL HIGH (ref 70–99)

## 2020-11-06 LAB — COMPREHENSIVE METABOLIC PANEL
ALT: 22 U/L (ref 0–44)
ALT: 22 U/L (ref 0–44)
AST: 18 U/L (ref 15–41)
AST: 29 U/L (ref 15–41)
Albumin: 2.6 g/dL — ABNORMAL LOW (ref 3.5–5.0)
Albumin: 2.2 g/dL — ABNORMAL LOW (ref 3.5–5.0)
Alkaline Phosphatase: 303 U/L — ABNORMAL HIGH (ref 38–126)
Alkaline Phosphatase: 252 U/L — ABNORMAL HIGH (ref 38–126)
Anion gap: 20 — ABNORMAL HIGH (ref 5–15)
Anion gap: 8 (ref 5–15)
BUN: 20 mg/dL (ref 8–23)
BUN: 15 mg/dL (ref 8–23)
CO2: 10 mmol/L — ABNORMAL LOW (ref 22–32)
CO2: 21 mmol/L — ABNORMAL LOW (ref 22–32)
Calcium: 8.7 mg/dL — ABNORMAL LOW (ref 8.9–10.3)
Calcium: 8.3 mg/dL — ABNORMAL LOW (ref 8.9–10.3)
Chloride: 107 mmol/L (ref 98–111)
Chloride: 110 mmol/L (ref 98–111)
Creatinine, Ser: 0.89 mg/dL (ref 0.61–1.24)
Creatinine, Ser: 0.57 mg/dL — ABNORMAL LOW (ref 0.61–1.24)
GFR, Estimated: >60 mL/min (ref >60)
GFR, Estimated: >60 mL/min (ref >60)
Glucose, Bld: 364 mg/dL — ABNORMAL HIGH (ref 70–99)
Glucose, Bld: 139 mg/dL — ABNORMAL HIGH (ref 70–99)
Potassium: 4.4 mmol/L (ref 3.5–5.1)
Potassium: 3 mmol/L — ABNORMAL LOW (ref 3.5–5.1)
Sodium: 137 mmol/L (ref 135–145)
Sodium: 139 mmol/L (ref 135–145)
Total Bilirubin: 1.9 mg/dL — ABNORMAL HIGH (ref 0.3–1.2)
Total Bilirubin: 0.1 mg/dL — ABNORMAL LOW (ref 0.3–1.2)
Total Protein: 7.8 g/dL (ref 6.5–8.1)
Total Protein: 6.7 g/dL (ref 6.5–8.1)

## 2020-11-06 LAB — CBC WITH DIFFERENTIAL/PLATELET
Abs Immature Granulocytes: 0.26 10*3/uL — ABNORMAL HIGH (ref 0.00–0.07)
Basophils Absolute: 0.1 10*3/uL (ref 0.0–0.1)
Basophils Relative: 0 %
Eosinophils Absolute: 0 10*3/uL (ref 0.0–0.5)
Eosinophils Relative: 0 %
HCT: 35.6 % — ABNORMAL LOW (ref 39.0–52.0)
Hemoglobin: 11 g/dL — ABNORMAL LOW (ref 13.0–17.0)
Immature Granulocytes: 1 %
Lymphocytes Relative: 2 %
Lymphs Abs: 0.5 10*3/uL — ABNORMAL LOW (ref 0.7–4.0)
MCH: 29.5 pg (ref 26.0–34.0)
MCHC: 30.9 g/dL (ref 30.0–36.0)
MCV: 95.4 fL (ref 80.0–100.0)
Monocytes Absolute: 0.7 10*3/uL (ref 0.1–1.0)
Monocytes Relative: 2 %
Neutro Abs: 29.4 10*3/uL — ABNORMAL HIGH (ref 1.7–7.7)
Neutrophils Relative %: 95 %
Platelets: 635 10*3/uL — ABNORMAL HIGH (ref 150–400)
RBC: 3.73 MIL/uL — ABNORMAL LOW (ref 4.22–5.81)
RDW: 14.9 % (ref 11.5–15.5)
WBC: 31 10*3/uL — ABNORMAL HIGH (ref 4.0–10.5)
nRBC: 0 % (ref 0.0–0.2)

## 2020-11-06 LAB — BASIC METABOLIC PANEL
Anion gap: 13 (ref 5–15)
Anion gap: 8 (ref 5–15)
Anion gap: 8 (ref 5–15)
Anion gap: 7 (ref 5–15)
BUN: 20 mg/dL (ref 8–23)
BUN: 17 mg/dL (ref 8–23)
BUN: 14 mg/dL (ref 8–23)
BUN: 14 mg/dL (ref 8–23)
CO2: 14 mmol/L — ABNORMAL LOW (ref 22–32)
CO2: 21 mmol/L — ABNORMAL LOW (ref 22–32)
CO2: 20 mmol/L — ABNORMAL LOW (ref 22–32)
CO2: 21 mmol/L — ABNORMAL LOW (ref 22–32)
Calcium: 8.4 mg/dL — ABNORMAL LOW (ref 8.9–10.3)
Calcium: 8.6 mg/dL — ABNORMAL LOW (ref 8.9–10.3)
Calcium: 8.1 mg/dL — ABNORMAL LOW (ref 8.9–10.3)
Calcium: 8.1 mg/dL — ABNORMAL LOW (ref 8.9–10.3)
Chloride: 109 mmol/L (ref 98–111)
Chloride: 111 mmol/L (ref 98–111)
Chloride: 108 mmol/L (ref 98–111)
Chloride: 108 mmol/L (ref 98–111)
Creatinine, Ser: 0.77 mg/dL (ref 0.61–1.24)
Creatinine, Ser: 0.62 mg/dL (ref 0.61–1.24)
Creatinine, Ser: 0.47 mg/dL — ABNORMAL LOW (ref 0.61–1.24)
Creatinine, Ser: 0.48 mg/dL — ABNORMAL LOW (ref 0.61–1.24)
GFR, Estimated: >60 mL/min (ref >60)
GFR, Estimated: >60 mL/min (ref >60)
GFR, Estimated: >60 mL/min (ref >60)
GFR, Estimated: >60 mL/min (ref >60)
Glucose, Bld: 293 mg/dL — ABNORMAL HIGH (ref 70–99)
Glucose, Bld: 159 mg/dL — ABNORMAL HIGH (ref 70–99)
Glucose, Bld: 144 mg/dL — ABNORMAL HIGH (ref 70–99)
Glucose, Bld: 153 mg/dL — ABNORMAL HIGH (ref 70–99)
Potassium: 3.9 mmol/L (ref 3.5–5.1)
Potassium: 3.2 mmol/L — ABNORMAL LOW (ref 3.5–5.1)
Potassium: 3 mmol/L — ABNORMAL LOW (ref 3.5–5.1)
Potassium: 3.4 mmol/L — ABNORMAL LOW (ref 3.5–5.1)
Sodium: 136 mmol/L (ref 135–145)
Sodium: 140 mmol/L (ref 135–145)
Sodium: 136 mmol/L (ref 135–145)
Sodium: 136 mmol/L (ref 135–145)

## 2020-11-06 LAB — MRSA PCR SCREENING: MRSA by PCR: NEGATIVE

## 2020-11-06 LAB — C-REACTIVE PROTEIN: CRP: 19.4 mg/dL — ABNORMAL HIGH (ref <1.0)

## 2020-11-06 LAB — APTT: aPTT: 36 seconds (ref 24–36)

## 2020-11-06 LAB — PROTIME-INR
INR: 1.6 — ABNORMAL HIGH (ref 0.8–1.2)
Prothrombin Time: 18.3 seconds — ABNORMAL HIGH (ref 11.4–15.2)

## 2020-11-06 LAB — BLOOD GAS, VENOUS
Acid-base deficit: 14.5 mmol/L — ABNORMAL HIGH (ref 0.0–2.0)
Bicarbonate: 9.1 mmol/L — ABNORMAL LOW (ref 20.0–28.0)
FIO2: 21
O2 Saturation: 83.2 %
Patient temperature: 98.6
pCO2, Ven: 17.2 mmHg — CL (ref 44.0–60.0)
pH, Ven: 7.346 (ref 7.250–7.430)
pO2, Ven: 52.9 mmHg — ABNORMAL HIGH (ref 32.0–45.0)

## 2020-11-06 LAB — BETA-HYDROXYBUTYRIC ACID
Beta-Hydroxybutyric Acid: 7.9 mmol/L — ABNORMAL HIGH (ref 0.05–0.27)
Beta-Hydroxybutyric Acid: 4.77 mmol/L — ABNORMAL HIGH (ref 0.05–0.27)
Beta-Hydroxybutyric Acid: 0.33 mmol/L — ABNORMAL HIGH (ref 0.05–0.27)
Beta-Hydroxybutyric Acid: 0.15 mmol/L (ref 0.05–0.27)

## 2020-11-06 LAB — PHOSPHORUS: Phosphorus: 2.4 mg/dL — ABNORMAL LOW (ref 2.5–4.6)

## 2020-11-06 LAB — PROCALCITONIN
Procalcitonin: 0.56 ng/mL
Procalcitonin: 0.56 ng/mL

## 2020-11-06 LAB — MAGNESIUM
Magnesium: 1.8 mg/dL (ref 1.7–2.4)
Magnesium: 1.7 mg/dL (ref 1.7–2.4)

## 2020-11-06 LAB — CORTISOL-AM, BLOOD: Cortisol - AM: 62.9 ug/dL — ABNORMAL HIGH (ref 6.7–22.6)

## 2020-11-06 MED ORDER — PANTOPRAZOLE SODIUM 40 MG IV SOLR
40.0000 mg | INTRAVENOUS | Status: DC
  Administered 2020-11-06 – 2020-11-10 (×5): 40 mg via INTRAVENOUS
  Filled 2020-11-06 (×5): qty 40

## 2020-11-06 MED ORDER — POTASSIUM CHLORIDE 10 MEQ/100ML IV SOLN
10.0000 meq | INTRAVENOUS | Status: AC
  Administered 2020-11-06 (×4): 10 meq via INTRAVENOUS
  Filled 2020-11-06 (×3): qty 100

## 2020-11-06 MED ORDER — POTASSIUM CHLORIDE 10 MEQ/100ML IV SOLN
10.0000 meq | INTRAVENOUS | Status: AC
  Filled 2020-11-06: qty 100

## 2020-11-06 MED ORDER — INSULIN DETEMIR 100 UNIT/ML ~~LOC~~ SOLN
20.0000 [IU] | Freq: Two times a day (BID) | SUBCUTANEOUS | Status: DC
  Administered 2020-11-06 – 2020-11-08 (×5): 20 [IU] via SUBCUTANEOUS
  Filled 2020-11-06 (×9): qty 0.2

## 2020-11-06 MED ORDER — METRONIDAZOLE IN NACL 5-0.79 MG/ML-% IV SOLN
500.0000 mg | Freq: Three times a day (TID) | INTRAVENOUS | Status: DC
  Administered 2020-11-06: 500 mg via INTRAVENOUS
  Filled 2020-11-06: qty 100

## 2020-11-06 MED ORDER — MIDAZOLAM HCL 2 MG/2ML IJ SOLN
INTRAMUSCULAR | Status: AC
  Filled 2020-11-06: qty 2

## 2020-11-06 MED ORDER — CHLORHEXIDINE GLUCONATE CLOTH 2 % EX PADS
6.0000 | MEDICATED_PAD | Freq: Every day | CUTANEOUS | Status: DC
  Administered 2020-11-06 – 2020-11-12 (×7): 6 via TOPICAL

## 2020-11-06 MED ORDER — DEXTROSE 50 % IV SOLN: 0.0000 mL | INTRAVENOUS | Status: DC | PRN

## 2020-11-06 MED ORDER — FENTANYL CITRATE (PF) 100 MCG/2ML IJ SOLN
INTRAMUSCULAR | Status: AC
  Filled 2020-11-06: qty 2

## 2020-11-06 MED ORDER — IOHEXOL 300 MG/ML  SOLN
100.0000 mL | Freq: Once | INTRAMUSCULAR | Status: AC | PRN
  Administered 2020-11-06: 100 mL via INTRAVENOUS

## 2020-11-06 MED ORDER — HYDRALAZINE HCL 20 MG/ML IJ SOLN: 10.0000 mg | Freq: Four times a day (QID) | INTRAMUSCULAR | Status: DC | PRN

## 2020-11-06 MED ORDER — LIDOCAINE HCL (PF) 1 % IJ SOLN
INTRAMUSCULAR | Status: AC | PRN
  Administered 2020-11-06: 10 mL via INTRADERMAL

## 2020-11-06 MED ORDER — SODIUM CHLORIDE 0.9 % IV SOLN
2.0000 g | INTRAVENOUS | Status: DC
  Administered 2020-11-06: 2 g via INTRAVENOUS
  Filled 2020-11-06: qty 20

## 2020-11-06 MED ORDER — FLUMAZENIL 0.5 MG/5ML IV SOLN
INTRAVENOUS | Status: AC
  Filled 2020-11-06: qty 5

## 2020-11-06 MED ORDER — LACTATED RINGERS IV SOLN: INTRAVENOUS | Status: DC

## 2020-11-06 MED ORDER — LACTATED RINGERS IV BOLUS
1000.0000 mL | Freq: Once | INTRAVENOUS | Status: AC
  Administered 2020-11-06: 1000 mL via INTRAVENOUS

## 2020-11-06 MED ORDER — INSULIN ASPART 100 UNIT/ML ~~LOC~~ SOLN
8.0000 [IU] | Freq: Once | SUBCUTANEOUS | Status: AC
  Administered 2020-11-06: 8 [IU] via SUBCUTANEOUS

## 2020-11-06 MED ORDER — MAGNESIUM SULFATE 2 GM/50ML IV SOLN
2.0000 g | Freq: Once | INTRAVENOUS | Status: AC
  Administered 2020-11-06: 2 g via INTRAVENOUS
  Filled 2020-11-06: qty 50

## 2020-11-06 MED ORDER — DEXTROSE IN LACTATED RINGERS 5 % IV SOLN: INTRAVENOUS | Status: DC

## 2020-11-06 MED ORDER — SODIUM CHLORIDE 0.9 % IV SOLN: 3.0000 g | Freq: Four times a day (QID) | INTRAVENOUS | Status: DC

## 2020-11-06 MED ORDER — SODIUM CHLORIDE 0.9 % IV SOLN
INTRAVENOUS | Status: AC
  Filled 2020-11-06: qty 250

## 2020-11-06 MED ORDER — INSULIN ASPART 100 UNIT/ML ~~LOC~~ SOLN
0.0000 [IU] | SUBCUTANEOUS | Status: DC
  Administered 2020-11-07 (×4): 2 [IU] via SUBCUTANEOUS
  Administered 2020-11-07: 1 [IU] via SUBCUTANEOUS
  Administered 2020-11-08: 2 [IU] via SUBCUTANEOUS
  Administered 2020-11-08: 3 [IU] via SUBCUTANEOUS
  Administered 2020-11-08: 1 [IU] via SUBCUTANEOUS
  Administered 2020-11-09: 3 [IU] via SUBCUTANEOUS
  Administered 2020-11-09: 7 [IU] via SUBCUTANEOUS
  Administered 2020-11-09: 3 [IU] via SUBCUTANEOUS
  Administered 2020-11-09: 1 [IU] via SUBCUTANEOUS
  Administered 2020-11-10: 7 [IU] via SUBCUTANEOUS
  Administered 2020-11-10: 5 [IU] via SUBCUTANEOUS
  Administered 2020-11-10: 2 [IU] via SUBCUTANEOUS
  Administered 2020-11-10: 7 [IU] via SUBCUTANEOUS

## 2020-11-06 MED ORDER — VANCOMYCIN HCL 1250 MG/250ML IV SOLN
1250.0000 mg | Freq: Two times a day (BID) | INTRAVENOUS | Status: DC
  Filled 2020-11-06: qty 250

## 2020-11-06 MED ORDER — PANCRELIPASE (LIP-PROT-AMYL) 12000-38000 UNITS PO CPEP
12000.0000 [IU] | ORAL_CAPSULE | Freq: Three times a day (TID) | ORAL | Status: DC
  Administered 2020-11-08 – 2020-11-12 (×12): 12000 [IU] via ORAL
  Filled 2020-11-06 (×19): qty 1

## 2020-11-06 MED ORDER — ORAL CARE MOUTH RINSE
15.0000 mL | Freq: Two times a day (BID) | OROMUCOSAL | Status: DC
  Administered 2020-11-06 – 2020-11-12 (×13): 15 mL via OROMUCOSAL

## 2020-11-06 MED ORDER — FENTANYL CITRATE (PF) 100 MCG/2ML IJ SOLN
INTRAMUSCULAR | Status: AC | PRN
  Administered 2020-11-06: 25 ug via INTRAVENOUS

## 2020-11-06 MED ORDER — INSULIN REGULAR(HUMAN) IN NACL 100-0.9 UT/100ML-% IV SOLN
INTRAVENOUS | Status: DC
  Administered 2020-11-06: 12 [IU]/h via INTRAVENOUS
  Filled 2020-11-06: qty 100

## 2020-11-06 MED ORDER — NALOXONE HCL 0.4 MG/ML IJ SOLN
INTRAMUSCULAR | Status: AC
  Filled 2020-11-06: qty 1

## 2020-11-06 MED ORDER — POTASSIUM PHOSPHATES 15 MMOLE/5ML IV SOLN
10.0000 mmol | Freq: Once | INTRAVENOUS | Status: AC
  Administered 2020-11-06: 10 mmol via INTRAVENOUS
  Filled 2020-11-06: qty 3.33

## 2020-11-06 MED ORDER — LACTATED RINGERS IV BOLUS
1000.0000 mL | INTRAVENOUS | Status: AC
  Administered 2020-11-06: 1000 mL via INTRAVENOUS

## 2020-11-06 MED ORDER — SODIUM CHLORIDE 0.9 % IV SOLN
3.0000 g | Freq: Four times a day (QID) | INTRAVENOUS | Status: DC
  Filled 2020-11-06 (×3): qty 8

## 2020-11-06 NOTE — Progress Notes (Signed)
Pharmacy Antibiotic Note  Colton Bush is a 65 y.o. male admitted on 11/05/2020 with history of diabetes, hypertension, hyperlipidemia, here presenting with altered mental status and fever Pharmacy has been consulted to dose Unasyn for hepatic abscess. 11/06/2020 Tm 102.9, WBC 31, SCr 0.62. IR consulted for possible hepatic abscess drain(s)  Plan: Unasyn 3 gm IV q6h F/u renal fxn, WBC, temp, culture data F/u ID recs  Height: 5\' 10"  (177.8 cm) Weight: 79 kg (174 lb 2.6 oz) IBW/kg (Calculated) : 73  Temp (24hrs), Avg:99.6 F (37.6 C), Min:98 F (36.7 C), Max:102.9 F (39.4 C)  Recent Labs  Lab 11/05/20 1914 11/05/20 1930 11/05/20 2007 11/06/20 0337 11/06/20 0716 11/06/20 1123  WBC  --  21.3*  --  31.0*  --   --   CREATININE  --  0.72 0.50* 0.89 0.77 0.62  LATICACIDVEN 1.6  --   --   --   --   --     Estimated Creatinine Clearance: 92.5 mL/min (by C-G formula based on SCr of 0.62 mg/dL).    No Known Allergies  Antimicrobials this admission: 4/8 cefepime >>4/9 4/8 vanc >>4/9 4/8 flagyl x 1, resumed 4/9 x 1 dose 4/9 CTX x 1 dose 4/9 Unasyn>> Dose adjustments this admission:    Microbiology results: 4/8 BCx2: ngtd 4/8 flu/covid neg 4/8 UCx: sent 4/9 MRSA neg PTA: 10/20/20 UCx: > 100K pan sens E coli F  Thank you for allowing pharmacy to be a part of this patient's care.  Eudelia Bunch, Pharm.D 11/06/2020 2:48 PM

## 2020-11-06 NOTE — Progress Notes (Signed)
  Echocardiogram 2D Echocardiogram has been performed.  Colton Bush 11/06/2020, 5:34 PM

## 2020-11-06 NOTE — Progress Notes (Signed)
   11/06/20 0449  Provider Notification  Provider Name/Title Inda Merlin MD  Date Provider Notified 11/06/20  Time Provider Notified 0425  Notification Type Page  Notification Reason Critical result  Test performed and critical result pCO2=17.2  Date Critical Result Received 11/06/20  Time Critical Result Received 0420  Provider response Evaluate remotely  Date of Provider Response 11/06/20  Time of Provider Response 0430

## 2020-11-06 NOTE — Sedation Documentation (Signed)
Dr. Wagner at bedside.

## 2020-11-06 NOTE — Progress Notes (Signed)
Inpatient Diabetes Program Recommendations  AACE/ADA: New Consensus Statement on Inpatient Glycemic Control (2015)  Target Ranges:  Prepandial:   less than 140 mg/dL      Peak postprandial:   less than 180 mg/dL (1-2 hours)      Critically ill patients:  140 - 180 mg/dL   Lab Results  Component Value Date   GLUCAP 192 (H) 11/06/2020   HGBA1C 10.8 (H) 10/18/2020    Review of Glycemic Control  Diabetes history: DM2 Outpatient Diabetes medications: Levemir 35 units am + 25 units pm + Novolog 3-12 units tid meal coverage Current orders for Inpatient glycemic control: IV insulin  Inpatient Diabetes Program Recommendations:   When ready to transition to subcutaneous insulin: Give basal 2 hrs prior to D/C of insulin drip -Levemir 20 units bid -Novolog 0-9 units q 4 hrs if NPO then tid + 0-5 units hs -Novolog 3 units tid meal coverage when eating 50% meals  Thank you, Bethena Roys E. Roniqua Kintz, RN, MSN, CDE  Diabetes Coordinator Inpatient Glycemic Control Team Team Pager [phone] (8am-5pm) 11/06/2020 11:08 AM

## 2020-11-06 NOTE — Consult Note (Signed)
Chief Complaint: Patient was seen in consultation today for hepatic abscesses s/p hepatic drain placement(s).  Referring Physician(s): Lenor Derrick [facility])  Supervising Physician: Corrie Mckusick  Patient Status: [facility] - In-pt  History of Present Illness: Colton Bush is a 65 y.o. male with a past medical history of hypertension, hyperlipidemia, C4-C7 spinal cord injury, bronchitis, GERD, hiatal hernia, pancreatic cancer s/p Whipple 2018, diabetes mellitus type II, right intertrochanteric femur fracture s/p IM fixation 10/25/2020, scoliosis, arthritis, and depression. He presented to [facility] ED via EMS from [place] home secondary to [place]. In ED, patient found to be septic. UA revealed UTI. CT chest/abdomen/pelvis revealed hepatic abscesses and bilateral pneumonia. He was admitted for further management. Also found to be in DKA, insulin drip currently running.  CT chest/abdomen/pelvis today: 1. Cluster of bilomas/abscess in the posterior right lobe liver with the largest component measuring up to 4.6 cm. Bile duct dilatation is similar to recent staging scan 08/31/2020. 2. Whipple procedure for pancreas carcinoma with infiltrative soft tissue about the narrowed celiac branches and hepatic hilum, reference dedicated staging scan at [facility] 08/31/2020. 3. No CT findings to correlate with history of pyelonephritis. 4. Airspace disease in the bilateral lower lobes compatible with pneumonia or aspiration. Notable subpleural involvement in the left lower lobe is likely consolidation given the overall pattern but please have low threshold for lower extremity Doppler ultrasound given recent right femur fracture repair. 5. Moderate stool distended rectum with mesorectal edema.  IR consulted by Dr. Cyd Silence for possible image-guided hepatic abscess drain placement(s). Patient laying in bed resting comfortably. Appears lethargic- opens eyes to painful stimuli, briefly says a few words  and quickly shuts eyes. History difficult to obtain secondary to lethargy. RN at bedside.  LD Eliquis this AM 0041.   Past Medical History:  Diagnosis Date  . Arthritis    left hand  . Bronchitis 1977  . Cancer ([place]) 03/09/2016   pancreatic cancer, sees Dr. Cristino Martes at [facility]   . Depression    takes Cymbalta daily  . Diabetes mellitus type II    sees Dr. Chalmers Cater   . GERD (gastroesophageal reflux disease)    takes Omeprazole daily  . H/O hiatal hernia   . Hyperlipidemia    takes Zocor daily  . Hypertension    takes Amlodipine daily  . Hypoglycemia 06/18/2017  . Neck pain    C4-7 stenosis and herniated disc  . Neuromuscular disorder ([place])    hiatal hernia  . Scoliosis    slight  . Spinal cord injury, C5-C7 ([place])    c4-c7  . Stiffness of hand joint    d/t cervical issues    Past Surgical History:  Procedure Laterality Date  . ANTERIOR CERVICAL DECOMP/DISCECTOMY FUSION  08/18/2011   Procedure: ANTERIOR CERVICAL DECOMPRESSION/DISCECTOMY FUSION 3 LEVELS;  Surgeon: Winfield Cunas, MD;  Location: [facility];  Service: Neurosurgery;  Laterality: N/A;  Anterior Cervical Four-Five/Five-Six/Six-Seven Decompression with Fusion, Plating, and Bonegraft  . CARPAL TUNNEL RELEASE  2013   bilateral, per Dr. Christella Noa   . COLONOSCOPY  10-30-14   per Dr. Olevia Perches, clear, repeat in 10 yrs   . egd with esophageal dilation  9-08   per Dr. Olevia Perches  . ERCP N/A 03/01/2016   Procedure: ENDOSCOPIC RETROGRADE CHOLANGIOPANCREATOGRAPHY (ERCP) with brushings and stent;  Surgeon: Doran Stabler, MD;  Location: [facility] ENDOSCOPY;  Service: Endoscopy;  Laterality: N/A;  . ESOPHAGOGASTRODUODENOSCOPY (EGD) WITH PROPOFOL N/A 05/27/2020   Procedure: ESOPHAGOGASTRODUODENOSCOPY (EGD) WITH PROPOFOL;  Surgeon: Milus Banister, MD;  Location: [facility];  Service: Endoscopy;  Laterality: N/A;  . EUS N/A 03/09/2016   Procedure: ESOPHAGEAL ENDOSCOPIC ULTRASOUND (EUS) RADIAL;  Surgeon: Milus Banister, MD;  Location: [facility]  ENDOSCOPY;  Service: Endoscopy;  Laterality: N/A;  . EUS N/A 05/27/2020   Procedure: UPPER ENDOSCOPIC ULTRASOUND (EUS) RADIAL;  Surgeon: Milus Banister, MD;  Location: [facility] ENDOSCOPY;  Service: Endoscopy;  Laterality: N/A;  . FEMUR IM NAIL Right 10/25/2020   Procedure: INTRAMEDULLARY (IM) NAIL FEMORAL;  Surgeon: Rod Can, MD;  Location: [facility]S;  Service: Orthopedics;  Laterality: Right;  . lymph nodes biopsy    . melanoma rt calf  1999  . PORT-A-CATH REMOVAL N/A 04/03/2019   Procedure: PORT REMOVAL;  Surgeon: Stark Klein, MD;  Location: [facility];  Service: General;  Laterality: N/A;  . PORTACATH PLACEMENT Left 03/22/2016   Procedure: INSERTION PORT-A-CATH;  Surgeon: Stark Klein, MD;  Location: [facility] ORS;  Service: General;  Laterality: Left;  . SPINE SURGERY    . TONSILLECTOMY     as a child  . ULNAR TUNNEL RELEASE  2013   right arm, per Dr. Christella Noa   . UPPER GASTROINTESTINAL ENDOSCOPY    . WHIPPLE PROCEDURE N/A 09/19/2016   Procedure: DIAGNOSTIC LAPAROSCOPY, LAPAROSCOPIC LIVER BIOPSY, RETROPERITONEAL EXPLORATION, INTRAOPERATIVE ULTRASOUND;  Surgeon: Stark Klein, MD;  Location: [facility];  Service: General;  Laterality: N/A;    Allergies: Patient has no known allergies.  Medications: Prior to Admission medications   Medication Sig Start Date End Date Taking? Authorizing Provider  acetaminophen (TYLENOL) 500 MG tablet Take 1,000 mg by mouth every 6 (six) hours as needed for moderate pain or headache.    [provider]  amLODipine (NORVASC) 10 MG tablet Take 1 tablet (10 mg total) by mouth daily. 01/13/20   [name], MD  apixaban (ELIQUIS) 2.5 MG TABS tablet Take 1 tablet (2.5 mg total) by mouth 2 (two) times daily. 10/27/20 11/26/20  Cherlynn June B, PA  B-D UF III MINI PEN NEEDLES 31G X 5 MM MISC Inject into the skin. 07/12/20   [provider]  benazepril (LOTENSIN) 10 MG tablet TAKE 1 TABLET DAILY Patient taking differently: Take 10 mg by mouth daily. 10/04/20    [name], MD  CREON 6000-19000 units CPEP Take 1 capsule by mouth with breakfast, with lunch, and with evening meal.  04/15/20   [provider]  diphenhydrAMINE HCl, Sleep, 25 MG TBDP Take 25 mg by mouth at bedtime as needed (sleep).    [provider]  docusate sodium (COLACE) 100 MG capsule Take 1 capsule (100 mg total) by mouth 2 (two) times daily. 10/28/20   [place] Donnamarie Poag, DO  ferrous sulfate 325 (65 FE) MG tablet Take 325 mg daily with breakfast by mouth.    [provider]  FREESTYLE LITE test strip USE ONE STRIP TO CHECK GLUCOSE ONCE DAILY. PLEASE  SCHEDULE FOLLOW UP 11/09/16   [name], MD  insulin detemir (LEVEMIR) 100 UNIT/ML FlexPen Inject 25-35 Units into the skin See admin instructions. Inject 35 units in the morning and 25 units at night 10/28/20   [place] Donnamarie Poag, DO  Insulin Pen Needle 30G X 5 MM MISC Use one daily with insulin 03/04/16   Copland, Gay Filler, MD  Lancets (FREESTYLE) lancets USE AS INSTRUCTED TO CHECK BLOOD SUGAR ONCE A DAY 06/02/16   [name], MD  loratadine (CLARITIN) 10 MG tablet Take 10 mg by mouth daily.  [provider]  Multiple Vitamin (MULTIVITAMIN WITH MINERALS) TABS tablet Take 1 tablet by mouth daily.    [provider]  NOVOLOG FLEXPEN 100 UNIT/ML FlexPen GIVE EVERY MORNING WITH BREAKFAST AND EVERY EVENING WITH SUPPER PER SLIDING SCALE Patient taking differently: Inject 3-12 Units into the skin 3 (three) times daily with meals. 04/17/17   [name], MD  Omega-3 Fatty Acids (FISH OIL) 500 MG CAPS Take 500 mg by mouth daily.    [provider]  omeprazole (PRILOSEC) 20 MG capsule TAKE 1 CAPSULE DAILY Patient taking differently: Take 20 mg by mouth daily. 12/02/19   [name], MD  potassium chloride SA (KLOR-CON M20) 20 MEQ tablet Take 1 tablet (20 mEq total) by mouth daily. 07/22/20   [name], MD  rosuvastatin (CRESTOR) 20 MG tablet Take 1 tablet (20 mg total) by mouth daily.  07/22/20   [name], MD  tamsulosin (FLOMAX) 0.4 MG CAPS capsule Take 1 capsule (0.4 mg total) by mouth daily. 10/29/20   [place] Eric J, DO     Family History  Problem Relation Age of Onset  . Heart disease Father   . Heart disease Brother 50  . Anesthesia problems Mother   . Heart disease Mother   . Dementia Mother   . Diabetes Sister   . Stroke Sister   . Colon cancer Neg Hx   . Rectal cancer Neg Hx   . Stomach cancer Neg Hx     Social History   Socioeconomic History  . Marital status: Married    Spouse name: [name]  . Number of children: 1  . Years of education: Not on file  . Highest education level: Not on file  Occupational History  . Occupation: Geophysicist/field seismologist  Tobacco Use  . Smoking status: Never Smoker  . Smokeless tobacco: Never Used  . Tobacco comment: tried a pipe 35 years ago   Vaping Use  . Vaping Use: Never used  Substance and Sexual Activity  . Alcohol use: No    Alcohol/week: 0.0 standard drinks  . Drug use: No  . Sexual activity: Yes  Other Topics Concern  . 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 Difficulty of Paying Living Expenses: Not hard at all  Food Insecurity: No Food Insecurity  . Worried About Charity fundraiser in the Last Year: Never true  . Ran Out of Food in the Last Year: Never true  Transportation Needs: No Transportation Needs  . Lack of Transportation (Medical): No  . Lack of Transportation (Non-Medical): No  Physical Activity: Inactive  . Days of Exercise per Week: 0 days  . Minutes of Exercise per Session: 0 min  Stress: No Stress Concern Present  . Feeling of Stress : Not at all  Social Connections: Socially Isolated  . Frequency of Communication with Friends and Family: Twice a week  . Frequency of Social Gatherings with Friends and Family: Never  . Attends Religious Services: Never  . Active Member of  Clubs or Organizations: No  . Attends Archivist Meetings: Never  . Marital Status: Married     Review of Systems: A 12 point ROS discussed and pertinent positives are indicated in the HPI above.  All other systems are negative.  Review of Systems  Unable to perform ROS: Mental status change (Lethargic.)  Vital Signs: BP 121/66 (BP Location: Left Arm)   Pulse (!) 116   Temp (!) 101 F (38.3 C) (Oral)   Resp 19   Ht 5\' 10"  (1.778 m)   Wt 174 lb 2.6 oz (79 kg)   SpO2 92%   BMI 24.99 kg/m   Physical Exam Constitutional:      General: He is not in acute distress.    Comments: Lethargic.  Cardiovascular:     Rate and Rhythm: Regular rhythm. Tachycardia present.     Heart sounds: Normal heart sounds. No murmur heard.   Pulmonary:     Effort: Pulmonary effort is normal. No respiratory distress.     Breath sounds: Normal breath sounds. No wheezing.  Skin:    General: Skin is warm and dry.  Neurological:     Comments: Lethargic, opens eyes to painful stimuli, briefly says a few words and then quickly shuts eyes.      MD Evaluation Airway: WNL Heart: WNL Abdomen: WNL Chest/ Lungs: WNL ASA  Classification: 3 Mallampati/Airway Score: Two   Imaging: DG Chest 1 View  Result Date: 10/18/2020 CLINICAL DATA:  Fall, pain and RIGHT hip. EXAM: CHEST  1 VIEW COMPARISON:  June 19, 2017 FINDINGS: Trachea midline. Cardiomediastinal contours and hilar structures are normal. Signs of atelectasis and scarring at the lung bases. Evidence of cervical spinal fusion partially imaged on the current study. On limited assessment no acute skeletal process. IMPRESSION: Signs of atelectasis and scarring at the lung bases. Electronically Signed   By: Zetta Bills M.D.   On: 10/18/2020 19:12   CT CHEST ABDOMEN PELVIS W CONTRAST  Result Date: 11/06/2020 CLINICAL DATA:  Pyelonephritis.  Complicated sepsis. EXAM: CT CHEST, ABDOMEN, AND PELVIS WITH CONTRAST TECHNIQUE: Multidetector  CT imaging of the chest, abdomen and pelvis was performed following the standard protocol during bolus administration of intravenous contrast. CONTRAST:  128mL OMNIPAQUE IOHEXOL 300 MG/ML  SOLN COMPARISON:  Outside abdominal CT 10/14/2020 FINDINGS: CT CHEST FINDINGS Cardiovascular: Normal heart size. No pericardial effusion. Coronary atherosclerosis. Limited opacification of the pulmonary arteries. Mediastinum/Nodes: Negative for adenopathy or mass. Lungs/Pleura: Airspace opacity in the subpleural left lower lobe and patchy reticulonodular density in the bilateral lower lobes. No edema or effusion. No pneumothorax. Musculoskeletal: No acute finding.  Mild scoliosis CT ABDOMEN PELVIS FINDINGS Hepatobiliary: Sequela of Whipple procedure.There is soft tissue density at the liver hilum better demonstrated on prior pancreas protocol CT with arterial narrowings described on that study. Intrahepatic bile duct dilatation which is similar to previous. Left lobe of the liver is atrophic in the setting of chronic left portal vein occlusion and hepatic arterial narrowings. There is a cluster of new low-density areas in the posterior right lobe liver with the largest collection measuring 4.6 cm. Mild hypo perfusion in the adjacent parenchyma. Pancreas: Status post Whipple procedure. Atrophy of the remaining pancreas. Soft tissue density around the celiac branches as described on outside CT staging. No acute pancreatitis is seen. Spleen: Negative Adrenals/Urinary Tract: Negative adrenals. No hydronephrosis or stone. Unremarkable bladder which is decompressed by a Foley catheter. Stomach/Bowel: No evidence of bowel obstruction. Sequela of local procedure. Rectal stool distention with mild mesorectal edema. The rectum measures up to 8 cm in diameter. Vascular/Lymphatic: Celiac branch narrowings and chronic left portal vein occlusion as described above. Atheromatous calcification. No discrete retroperitoneal nodes. Reproductive:No  pathologic findings. Other: No ascites or pneumoperitoneum. Musculoskeletal: Intertrochanteric right femur fracture with recent repair. No acute osseous finding. Lumbar spine degeneration and scoliosis. Message was  sent to the clinical team via secure chat in epic. IMPRESSION: 1. Cluster of bilomas/abscess in the posterior right lobe liver with the largest component measuring up to 4.6 cm. Bile duct dilatation is similar to recent staging scan 08/31/2020. 2. Whipple procedure for pancreas carcinoma with infiltrative soft tissue about the narrowed celiac branches and hepatic hilum, reference dedicated staging scan at [facility] 08/31/2020. 3. No CT findings to correlate with history of pyelonephritis. 4. Airspace disease in the bilateral lower lobes compatible with pneumonia or aspiration. Notable subpleural involvement in the left lower lobe is likely consolidation given the overall pattern but please have low threshold for lower extremity Doppler ultrasound given recent right femur fracture repair. 5. Moderate stool distended rectum with mesorectal edema. Electronically Signed   By: Monte Fantasia M.D.   On: 11/06/2020 06:29   DG Chest Port 1 View  Result Date: 11/05/2020 CLINICAL DATA:  Sepsis. EXAM: PORTABLE CHEST 1 VIEW COMPARISON:  Chest x-ray 10/18/2020 FINDINGS: The cardiac silhouette, mediastinal and hilar contours are within normal limits. The lungs are clear of an acute process. No pulmonary lesions or pleural effusion. The bony thorax is intact. IMPRESSION: No acute cardiopulmonary findings. Electronically Signed   By: Marijo Sanes M.D.   On: 11/05/2020 18:53   DG Knee Right Port  Result Date: 10/19/2020 CLINICAL DATA:  History of distal femur fracture. EXAM: PORTABLE RIGHT KNEE - 1-2 VIEW COMPARISON:  No prior. FINDINGS: Limited two view of exam obtained. Mild soft tissue swelling about the knee cannot be excluded. Severe Tricompartment degenerative change with chondrocalcinosis. Deformity  noted of the proximal fibula consistent with old healed fracture. No acute fracture identified. IMPRESSION: 1.  Mild soft tissue swelling about the knee cannot be excluded. 2. Severe tricompartment degenerative change with chondrocalcinosis. Deformity noted the proximal right fibula consistent with old healed fracture. No acute bony abnormality identified. Electronically Signed   By: Marcello Moores  Register   On: 10/19/2020 09:46   DG C-Arm 1-60 Min-No Report  Result Date: 10/25/2020 Fluoroscopy was utilized by the requesting physician.  No radiographic interpretation.   ECHOCARDIOGRAM COMPLETE  Result Date: 10/22/2020    ECHOCARDIOGRAM REPORT   Patient Name:   IOANE BHOLA Date of Exam: 10/22/2020 Medical Rec #:  [id number]      Height:       70.0 in Accession #:    [id number]     Weight:       196.0 lb Date of Birth:  November 17, 1955      BSA:          2.069 m Patient Age:    72 years       BP:           136/69 mmHg Patient Gender: M              HR:           79 bpm. Exam Location:  Inpatient Procedure: 2D Echo, Cardiac Doppler and Color Doppler Indications:    Bacteremia  History:        Patient has no prior history of Echocardiogram examinations.                 Signs/Symptoms:Bacteremia and UTI; Risk Factors:Hypertension,                 Diabetes and Dyslipidemia. Pancreatic cancer.  Sonographer:    Dustin Flock Referring Phys: [phone] A CALDWELL POWELL JR  Sonographer Comments: Image acquisition challenging due to respiratory motion. IMPRESSIONS  1.  Left ventricular ejection fraction, by estimation, is 55 to 60%. The left ventricle has normal function. The left ventricle has no regional wall motion abnormalities. There is mild concentric left ventricular hypertrophy. Left ventricular diastolic parameters are consistent with Grade I diastolic dysfunction (impaired relaxation).  2. Right ventricular systolic function is normal. The right ventricular size is normal.  3. The mitral valve is normal in structure.  No evidence of mitral valve regurgitation. No evidence of mitral stenosis.  4. The aortic valve is normal in structure. Aortic valve regurgitation is not visualized. No aortic stenosis is present.  5. The inferior vena cava is normal in size with greater than 50% respiratory variability, suggesting right atrial pressure of 3 mmHg. Conclusion(s)/Recommendation(s): No evidence of valvular vegetations on this transthoracic echocardiogram. Would recommend a transesophageal echocardiogram to exclude infective endocarditis if clinically indicated. FINDINGS  Left Ventricle: Left ventricular ejection fraction, by estimation, is 55 to 60%. The left ventricle has normal function. The left ventricle has no regional wall motion abnormalities. The left ventricular internal cavity size was normal in size. There is  mild concentric left ventricular hypertrophy. Left ventricular diastolic parameters are consistent with Grade I diastolic dysfunction (impaired relaxation). Indeterminate filling pressures. Right Ventricle: The right ventricular size is normal. No increase in right ventricular wall thickness. Right ventricular systolic function is normal. Left Atrium: Left atrial size was normal in size. Right Atrium: Right atrial size was normal in size. Pericardium: There is no evidence of pericardial effusion. Mitral Valve: The mitral valve is normal in structure. Mild mitral annular calcification. No evidence of mitral valve regurgitation. No evidence of mitral valve stenosis. Tricuspid Valve: The tricuspid valve is normal in structure. Tricuspid valve regurgitation is not demonstrated. No evidence of tricuspid stenosis. Aortic Valve: The aortic valve is normal in structure. Aortic valve regurgitation is not visualized. No aortic stenosis is present. Pulmonic Valve: The pulmonic valve was normal in structure. Pulmonic valve regurgitation is not visualized. No evidence of pulmonic stenosis. Aorta: The aortic root is normal in size  and structure. Venous: The inferior vena cava is normal in size with greater than 50% respiratory variability, suggesting right atrial pressure of 3 mmHg. IAS/Shunts: No atrial level shunt detected by color flow Doppler.  LEFT VENTRICLE PLAX 2D LVIDd:         3.90 cm  Diastology LVIDs:         2.70 cm  LV e' medial:    7.72 cm/s LV PW:         1.30 cm  LV E/e' medial:  9.3 LV IVS:        1.30 cm  LV e' lateral:   6.64 cm/s LVOT diam:     2.50 cm  LV E/e' lateral: 10.8 LV SV:         102 LV SV Index:   49 LVOT Area:     4.91 cm  RIGHT VENTRICLE RV Basal diam:  2.90 cm RV S prime:     10.80 cm/s TAPSE (M-mode): 2.9 cm LEFT ATRIUM             Index       RIGHT ATRIUM           Index LA diam:        3.30 cm 1.59 cm/m  RA Area:     12.20 cm LA Vol (A2C):   54.9 ml 26.53 ml/m RA Volume:   29.40 ml  14.21 ml/m LA Vol (A4C):   29.8 ml 14.40  ml/m LA Biplane Vol: 41.4 ml 20.01 ml/m  AORTIC VALVE LVOT Vmax:   92.40 cm/s LVOT Vmean:  63.500 cm/s LVOT VTI:    0.207 m  AORTA Ao Root diam: 3.20 cm MITRAL VALVE MV Area (PHT): 7.16 cm     SHUNTS MV Decel Time: 106 msec     Systemic VTI:  0.21 m MV E velocity: 71.80 cm/s   Systemic Diam: 2.50 cm MV A velocity: 111.00 cm/s MV E/A ratio:  0.65 Mihai Croitoru MD Electronically signed by Sanda Klein MD Signature Date/Time: 10/22/2020/4:07:33 PM    Final    DG HIP OPERATIVE UNILAT W OR W/O PELVIS RIGHT  Result Date: 10/25/2020 CLINICAL DATA:  IM nail right femur EXAM: OPERATIVE right HIP (WITH PELVIS IF PERFORMED) 4 VIEWS TECHNIQUE: Fluoroscopic spot image(s) were submitted for interpretation post-operatively. COMPARISON:  10/18/2020 FINDINGS: Four low resolution intraoperative spot views of the right hip. Total fluoroscopy time was 48 seconds. The images demonstrate intramedullary rod and distal screw fixation of right femur for comminuted intertrochanteric fracture. IMPRESSION: Intraoperative fluoroscopic assistance provided during surgical fixation of right proximal  femoral fracture Electronically Signed   By: Donavan Foil M.D.   On: 10/25/2020 19:10   DG Hip Unilat With Pelvis 2-3 Views Right  Result Date: 10/18/2020 CLINICAL DATA:  Fall, hip pain status post fall EXAM: DG HIP (WITH OR WITHOUT PELVIS) 2-3V RIGHT COMPARISON:  Prior CT imaging of the chest, abdomen and pelvis. FINDINGS: No fracture of the bony pelvis.  Spinal degenerative changes. Comminuted RIGHT proximal femoral fracture, intratrochanteric type with distraction of the lesser trochanter. Mild displacement of distal femur relative to proximal femur, slight anterior displacement 2-3 mm. Mild to moderate varus angulation at the fracture site. Sclerotic lesion in the LEFT iliac bone unchanged likely large bone island. IMPRESSION: Comminuted mildly angulated RIGHT intratrochanteric fracture. Electronically Signed   By: Zetta Bills M.D.   On: 10/18/2020 19:17    Labs:  CBC: Recent Labs    10/26/20 0330 10/27/20 0327 11/05/20 1930 11/05/20 2007 11/06/20 0337  WBC 19.2* 15.3* 21.3*  --  31.0*  HGB 9.6* 10.6* 12.5* 12.9* 11.0*  HCT 29.4* 32.5* 39.1 38.0* 35.6*  PLT 494* 541* 529*  --  635*    COAGS: Recent Labs    10/19/20 0259 10/24/20 0814 11/05/20 1930 11/06/20 0337  INR 1.4* 1.2 1.5* 1.6*  APTT  --   --  32 36    BMP: Recent Labs    04/14/20 0925 07/09/20 0825 10/27/20 0327 11/05/20 1930 11/05/20 2007 11/06/20 0337 11/06/20 0716  NA 141   < > 135 134* 136 137 136  K 3.3*   < > 4.0 4.3 4.4 4.4 3.9  CL 107   < > 102 102 104 107 109  CO2 27   < > 24 14*  --  10* 14*  GLUCOSE 73   < > 265* 352* 363* 364* 293*  BUN 9   < > 28* 18 18 20 20   CALCIUM 8.8*   < > 8.4* 8.6*  --  8.7* 8.4*  CREATININE 0.71   < > 0.68 0.72 0.50* 0.89 0.77  GFRNONAA >60   < > >60 >60  --  >60 >60  GFRAA >60  --   --   --   --   --   --    < > = values in this interval not displayed.    LIVER FUNCTION TESTS: Recent Labs    10/25/20 0316 10/26/20 0330 11/05/20  1930 11/06/20 0337   BILITOT 0.5 0.8 1.6* 1.9*  AST 12* 17 20 18   ALT 18 21 24 22   ALKPHOS 218* 211* 336* 303*  PROT 6.9 7.2 8.2* 7.8  ALBUMIN 2.1* 2.3* 2.8* 2.6*     Assessment and Plan:  Sepsis secondary to hepatic abscesses/bilateral pneumonia. Plan for image-guided hepatic abscess drain placement(s) today in IR. Patient is NPO. Febrile, leukocytosis (WBCs 31.0 today). Ok to proceed with Eliquis use per Dr. Serafina Royals. INR 1.6 today.  Risks and benefits discussed with the patient including bleeding, infection, damage to adjacent structures, bowel perforation/fistula connection, and sepsis. All of the patient's wife's questions were answered, she is agreeable to proceed. Consent obtained by patient's wife, Burnette Sautter, via telephone- signed and in chart.   Thank you for this interesting consult.  I greatly enjoyed meeting TAMERON LAMA and look forward to participating in their care.  A copy of this report was sent to the requesting provider on this date.  Electronically Signed: Earley Abide, PA-C 11/06/2020, 10:46 AM   I spent a total of 40 Minutes in face to face in clinical consultation, greater than 50% of which was counseling/coordinating care for hepatic abscesses s/p hepatic drain placement(s).

## 2020-11-06 NOTE — Consult Note (Signed)
Date of Admission:  11/05/2020          Reason for Consult: Liver abscesses   Referring Provider: Dr. Alfredia Ferguson   Assessment:  1.  Liver abscesses 2.  Recent Streptococcus anginosis bacteremia 3. Hx of pancreatic cancer w recurrence 4. Right hip fracture with IM nail 5. DM 6. CHF  Plan:  1. Change antibiotics to Unasyn 2. Follow-up cultures from aspirate of liver abscesses 3. Repeat echocardiogram to evaluate heart valves   Principal Problem:   Sepsis ([place]) Active Problems:   Essential hypertension   GERD without esophagitis   Mixed diabetic hyperlipidemia associated with type 2 diabetes mellitus ([place])   Adenocarcinoma of head of pancreas ([place])   Uncontrolled type 2 diabetes mellitus with ketoacidosis without coma, with long-term current use of insulin (HCC)   Increased anion gap metabolic acidosis   Chronic diastolic CHF (congestive heart failure) (HCC)   BPH with urinary obstruction   Pressure ulcer of sacral region, stage 2 (HCC)   Pneumonia of both lower lobes due to infectious organism   Hepatic abscess   Sepsis secondary to UTI ([place])   Scheduled Meds: . Chlorhexidine Gluconate Cloth  6 each Topical Daily  . flumazenil      . insulin aspart  0-9 Units Subcutaneous Q4H  . insulin detemir  20 Units Subcutaneous BID  . loratadine  10 mg Oral Daily  . mouth rinse  15 mL Mouth Rinse BID  . midazolam      . naloxone      . pantoprazole (PROTONIX) IV  40 mg Intravenous Q24H  . tamsulosin  0.4 mg Oral Daily   Continuous Infusions: . ampicillin-sulbactam (UNASYN) IV    . dextrose 5% lactated ringers 150 mL/hr at 11/06/20 1330  . insulin 2.8 mL/hr at 11/06/20 1330  . magnesium sulfate bolus IVPB    . potassium chloride    . potassium PHOSPHATE IVPB (in mmol)    . sodium chloride     PRN Meds:.acetaminophen **OR** acetaminophen, dextrose, hydrALAZINE, ondansetron **OR** ondansetron (ZOFRAN) IV, polyethylene glycol  HPI: Colton Bush is a 65 y.o. male  history of pancreatic cancer which is unfortunately recurred after initially being diagnosed in 2017 diabetes mellitus hypertension hyperlipidemia diastolic congestive heart failure who sustained a recent right hip fracture.  He was initially hospitalized at [facility] from 21 March.  He had fallen and found to have a right hip fracture.  He grew E. coli from urine and also from his blood he grew Streptococcus anginosus from 1 out of 2 sites.  My partner Dr Baxter Flattery saw the patient and felt he could complete shorter course of antibiotics given streptococcus anginosus only isolated in 1/2 cultures with ceftriaxone. He underwent fixation and intramedullary nail of his right hip fracture on the 28th.  He was discharged to [facility] facility on 31st.  In the interim the patient developed worsening cognition and lethargy weakness poor appetite.  He was brought to the emergency department found to have evidence of SIRS.  UA was suggestive of urinary tract infection he was again thought to have a urinary tract infection.  In the interim a CT of the abdomen pelvis was performed which showed a cluster of low-density areas in the posterior right lobe of the liver with a largest collection measuring 4.6 cm concerning for liver abscess.  Patient was placed on vancomycin cefepime and metronidazole.  I had narrowed the patient to Unasyn he has now undergone IR guided  aspirate of 2 liver abscesses.  I suspect the Streptococcus anginosus was a primary pathogen in his liver abscesses which were likely present on prior admission but not revealed due to lack of imaging.  I am going to repeat a 2D echocardiogram to look at his heart valves yet again.  Prior 2D echocardiogram failed to show evidence of endocarditis.  We will follow up cultures from liver abscesses.       Review of Systems: Review of Systems  Unable to perform ROS: Critical illness    Past Medical History:  Diagnosis  Date  . Arthritis    left hand  . Bronchitis 1977  . Cancer ([place]) 03/09/2016   pancreatic cancer, sees Dr. Cristino Martes at [facility]   . Depression    takes Cymbalta daily  . Diabetes mellitus type II    sees Dr. Chalmers Cater   . GERD (gastroesophageal reflux disease)    takes Omeprazole daily  . H/O hiatal hernia   . Hyperlipidemia    takes Zocor daily  . Hypertension    takes Amlodipine daily  . Hypoglycemia 06/18/2017  . Neck pain    C4-7 stenosis and herniated disc  . Neuromuscular disorder ([place])    hiatal hernia  . Scoliosis    slight  . Spinal cord injury, C5-C7 ([place])    c4-c7  . Stiffness of hand joint    d/t cervical issues    Social History   Tobacco Use  . Smoking status: Never Smoker  . Smokeless tobacco: Never Used  . Tobacco comment: tried a pipe 35 years ago   Vaping Use  . Vaping Use: Never used  Substance Use Topics  . Alcohol use: No    Alcohol/week: 0.0 standard drinks  . Drug use: No    Family History  Problem Relation Age of Onset  . Heart disease Father   . Heart disease Brother 39  . Anesthesia problems Mother   . Heart disease Mother   . Dementia Mother   . Diabetes Sister   . Stroke Sister   . Colon cancer Neg Hx   . Rectal cancer Neg Hx   . Stomach cancer Neg Hx    No Known Allergies  OBJECTIVE: Blood pressure 118/71, pulse (!) 102, temperature 99.9 F (37.7 C), temperature source Axillary, resp. rate 18, height 5\' 10"  (1.778 m), weight 79 kg, SpO2 98 %.  Physical Exam Constitutional:      Appearance: He is well-developed.  HENT:     Head: Normocephalic and atraumatic.  Eyes:     Conjunctiva/sclera: Conjunctivae normal.  Cardiovascular:     Rate and Rhythm: Regular rhythm. Tachycardia present.     Heart sounds: No murmur heard. Friction rub present. No gallop.   Pulmonary:     Effort: Pulmonary effort is normal. No respiratory distress.     Breath sounds: No stridor. No wheezing or rhonchi.  Abdominal:     General: There is no  distension.     Palpations: Abdomen is soft.     Tenderness: There is abdominal tenderness.  Musculoskeletal:        General: No tenderness. Normal range of motion.     Cervical back: Normal range of motion and neck supple.  Skin:    General: Skin is warm and dry.     Coloration: Skin is not pale.     Findings: No erythema or rash.  Neurological:     Mental Status: He is alert. He is disoriented.  Psychiatric:  Speech: He is noncommunicative.        Cognition and Memory: Cognition is impaired. Memory is impaired. He exhibits impaired recent memory and impaired remote memory.     Lab Results Lab Results  Component Value Date   WBC 31.0 (H) 11/06/2020   HGB 11.0 (L) 11/06/2020   HCT 35.6 (L) 11/06/2020   MCV 95.4 11/06/2020   PLT 635 (H) 11/06/2020    Lab Results  Component Value Date   CREATININE 0.57 (L) 11/06/2020   BUN 15 11/06/2020   NA 139 11/06/2020   K 3.0 (L) 11/06/2020   CL 110 11/06/2020   CO2 21 (L) 11/06/2020    Lab Results  Component Value Date   ALT 22 11/06/2020   AST 29 11/06/2020   ALKPHOS 252 (H) 11/06/2020   BILITOT 0.1 (L) 11/06/2020     Microbiology: Recent Results (from the past 240 hour(s))  Resp Panel by RT-PCR (Flu A&B, Covid) Nasopharyngeal Swab     Status: None   Collection Time: 11/05/20  7:14 PM   Specimen: Nasopharyngeal Swab; Nasopharyngeal(NP) swabs in vial transport medium  Result Value Ref Range Status   SARS Coronavirus 2 by RT PCR NEGATIVE NEGATIVE Final    Comment: (NOTE) SARS-CoV-2 target nucleic acids are NOT DETECTED.  The SARS-CoV-2 RNA is generally detectable in upper respiratory specimens during the acute phase of infection. The lowest concentration of SARS-CoV-2 viral copies this assay can detect is 138 copies/mL. A negative result does not preclude SARS-Cov-2 infection and should not be used as the sole basis for treatment or other patient management decisions. A negative result may occur with  improper  specimen collection/handling, submission of specimen other than nasopharyngeal swab, presence of viral mutation(s) within the areas targeted by this assay, and inadequate number of viral copies(<138 copies/mL). A negative result must be combined with clinical observations, patient history, and epidemiological information. The expected result is Negative.  Fact Sheet for Patients:  EntrepreneurPulse.com.au  Fact Sheet for Healthcare Providers:  IncredibleEmployment.be  This test is no t yet approved or cleared by the [place] FDA and  has been authorized for detection and/or diagnosis of SARS-CoV-2 by FDA under an Emergency Use Authorization (EUA). This EUA will remain  in effect (meaning this test can be used) for the duration of the COVID-19 declaration under Section 564(b)(1) of the Act, 21 U.S.C.section 360bbb-3(b)(1), unless the authorization is terminated  or revoked sooner.       Influenza A by PCR NEGATIVE NEGATIVE Final   Influenza B by PCR NEGATIVE NEGATIVE Final    Comment: (NOTE) The Xpert Xpress SARS-CoV-2/FLU/RSV plus assay is intended as an aid in the diagnosis of influenza from Nasopharyngeal swab specimens and should not be used as a sole basis for treatment. Nasal washings and aspirates are unacceptable for Xpert Xpress SARS-CoV-2/FLU/RSV testing.  Fact Sheet for Patients: EntrepreneurPulse.com.au  Fact Sheet for Healthcare Providers: IncredibleEmployment.be  This test is not yet approved or cleared by the [place] FDA and has been authorized for detection and/or diagnosis of SARS-CoV-2 by FDA under an Emergency Use Authorization (EUA). This EUA will remain in effect (meaning this test can be used) for the duration of the COVID-19 declaration under Section 564(b)(1) of the Act, 21 U.S.C. section 360bbb-3(b)(1), unless the authorization is terminated or revoked.  Performed at  [facility], [place] [address], [place], [place] [address]   Blood Culture (routine x 2)     Status: None (Preliminary result)   Collection Time:  11/05/20  8:36 PM   Specimen: BLOOD  Result Value Ref Range Status   Specimen Description   Final    BLOOD BLOOD RIGHT FOREARM Performed at [facility] [address] [place], [place] [address]    Special Requests   Final    BOTTLES DRAWN AEROBIC AND ANAEROBIC Blood Culture results may not be optimal due to an inadequate volume of blood received in culture bottles Performed at [facility] [address] [place], [place] [address]    Culture   Final    NO GROWTH < 12 HOURS Performed at [facility] [address] [place], [place] [address]    Report Status PENDING  Incomplete  Blood Culture (routine x 2)     Status: None (Preliminary result)   Collection Time: 11/05/20  8:38 PM   Specimen: BLOOD  Result Value Ref Range Status   Specimen Description   Final    BLOOD Performed at [facility] [address], [place], [place] [address]    Special Requests   Final    BOTTLES DRAWN AEROBIC AND ANAEROBIC Blood Culture results may not be optimal due to an inadequate volume of blood received in culture bottles Performed at [facility] [address] [place], [place] [address]    Culture   Final    NO GROWTH < 12 HOURS Performed at [facility] [address] [place], [place] [address]    Report Status PENDING  Incomplete  MRSA PCR Screening     Status: None   Collection Time: 11/06/20  8:42 AM   Specimen: Nasopharyngeal  Result Value Ref Range Status   MRSA by PCR NEGATIVE NEGATIVE Final    Comment:        The GeneXpert MRSA Assay (FDA approved for NASAL specimens only), is one component of a comprehensive MRSA colonization surveillance program. It is not intended to diagnose MRSA infection nor to guide or monitor  treatment for MRSA infections. Performed at [facility], [place] [address] [place], [place] [address]     Alcide Evener, [place] [facility] [phone] pager  11/06/2020, 5:24 PM

## 2020-11-06 NOTE — Sedation Documentation (Signed)
Patient arrived with bedside RN.

## 2020-11-06 NOTE — Procedures (Addendum)
Interventional Radiology Procedure Note  Procedure: Image guided drain placement, 2 x hepatic abscess.  2 x 24F pigtail drain into right liver.  Complications: None  EBL: None Sample: Culture sent from 2 separate abscess  1: Superior drain 2: inferior drain  Recommendations: - Routine drain care, with sterile flushes, record output - follow up Cx - routine wound care - OK to dose AC as needed on schedule  Signed,  Dulcy Fanny. Earleen Newport, DO

## 2020-11-06 NOTE — Progress Notes (Signed)
PROGRESS NOTE    Colton Bush  TFT:[id number] DOB: May 25, 1956 DOA: 11/05/2020 PCP: Wenda Low, MD   Brief Narrative:  Patient is a 65 year old Caucasian male with a past medical history significant for but not limited to pancreatic cancer diagnosed in 2017 with recent recurrence, diabetes mellitus type 2, hypertension, hyperlipidemia, chronic diastolic CHF with an EF of 55 to 60% and grade 1 diastolic dysfunction back in March 2022, recent right hip fracture status post repair and intramedullary nail as well as other comorbidities who presented to [facility] long ED with a chief complaint of a sudden change in mental status.  Patient was extremely lethargic and not able to provide a subjective history.  He recently had a hospitalization from March 21 until March 31 after a fall and suffering a right hip fracture.  Hospitalization at that time was complicated by urinary retention requiring Foley catheter placement.  Additionally he is thought to be suffering from an E. coli UTI so he was placed on antibiotics.  Finally the resident was also found to have 1 blood culture positive for strep angina gnosis and was initially treated with IV ceftriaxone.  He was evaluated by infectious disease and repeat blood cultures ended up with no growth and patient was cleared to proceed with a right hip repair.  He underwent fixation with intramedullary nail on 10/25/2020 and is eventually discharged to [facility] on 331.  He is slowly gaining his strength back at [place] the last day however had a sudden change in his mental status and begin developing lethargy 24 to 48 hours ago.  This rapidly progressed and patient became quite weak had a poor appetite and lethargic.  Over the span that time he also exhibited dry nonproductive cough.  The facility contacted EMS and patient was brought to [facility] for evaluation.  In the ED is found to be septic and initial source was thought to be  another UTI but then further work-up revealed that he had a liver abscess.  IR was consulted to have the drain placement in his liver abscess and ID was consulted for antibiotic management.  Of note patient was also found to be in DKA upon evaluation and remained on insulin drip until this evening when he is being transitioned.  Assessment & Plan:   Principal Problem:   Sepsis ([place]) Active Problems:   Essential hypertension   GERD without esophagitis   Mixed diabetic hyperlipidemia associated with type 2 diabetes mellitus ([place])   Adenocarcinoma of head of pancreas ([place])   Uncontrolled type 2 diabetes mellitus with ketoacidosis without coma, with long-term current use of insulin (HCC)   Increased anion gap metabolic acidosis   Chronic diastolic CHF (congestive heart failure) (HCC)   BPH with urinary obstruction   Pressure ulcer of sacral region, stage 2 (HCC)   Pneumonia of both lower lobes due to infectious organism   Hepatic abscess   Sepsis secondary to UTI ([place])  Severe sepsis secondary to strep anginosis likely bacteremia associated with liver abscess and bilateral lower lobe pneumonia present on admission -Initially felt the patient was suffering from a persistent catheter associated UTI however CT imaging of the chest abdomen pelvis found the patient to have an abscess located in the posterior right lobe of the liver. -Additionally he is also found to have bilateral airspace disease in the lower lobes suggestive of pneumonia -Recently he was having a strep anginosis which could have precipitated bacteremia and an pneumonia and liver abscess -He met  SIRS criteria on admission being febrile, have a leukocytosis, source of infection as well being tachycardic and being altered -Blood cultures x2 were obtained and patient was given aggressive IV fluid hydration with isotonic fluids -Treated with broad-spectrum IV antibiotics with IV vancomycin, IV cefepime and IV Flagyl and this was  narrowed to IV Unasyn by infectious diseases -Interventional radiology was consulted and patient ended up having 2 drains placed today -Patient was placed in the stepdown unit given his severe sepsis -Infectious diseases recommended following the cultures from aspirate of the liver abscesses and repeating echocardiogram to evaluate the patient's heart valve -Patient was given IV fluids aggressively with LR and this was changed to D5 LR given his DKA -Currently remains on D5 and LR at 125 mL's per hour and will reduce this to 75 MLS per hour now it is DKA is resolving -Urinalysis done on admission showed a hazy appearance with greater than 500 glucose, moderate hemoglobin, 80 ketones, trace leukocytes, 30 protein, rare bacteria, 6-10 RBCs per high-power field, 0-5 squamous epithelial cells, greater than 50 WBCs the urine culture pending -CRP was 19.4 -Procalcitonin level was 0.56 -The C was significantly elevated at 31,000 -We will continue monitor temperature curve  Diabetic ketoacidosis without coma in the setting of infection as above Like acidosis -Overnight the patient has developed progressively worsening metabolic acidosis with anion gap and markedly elevated beta hydroxybutyrate with persisting substantial hyperglycemia all concerning for developing diabetic ketoacidosis -This had likely been precipitated by underlying sepsis -Patient's beta hydroxybutyrate acid went from 7.90 and trended all the way down to 0.33 -He was initiated on insulin infusion and transferred to stepdown and now that his gap is closed and his numbers have improved he was transitioned off of the insulin drip to long-acting insulin Levemir 20 units twice daily nitiating insulin infusion and transferring to stepdown unit for continued management -Performed serial chemistries and serial beta hydroxybutyrate levels and they were all improving -Hydrating patient aggressively with intravenous isotonic fluids and as above is  now on D5 inLR at 75 mL's per hour -Patient remained n.p.o. until his gap closed and till after his procedure but now will resume a diet and had a heart healthy carb modified diet -Metabolic acidosis is improved significantly now CO2 is 21, anion gap is 8, chloride level is 110 Plan continue monitor and trend repeat CMP in a.m.  Essential Hypertension -Holding home regimen of oral antipretenses due to tenuous oral intake initially but now he is more awake -C/w As needed intravenous antihypertensives is for markedly low blood pressure. -Currently holding his amlodipine and benazepril -Continue to Monitor BP per Protocol   Mixed diabetic hyperlipidemia associated with type 2 diabetes mellitus (HCC) -Continuing home regimen of statin therapy if patient is able to tolerate oral intake as well as his omega-3  Depression -Resume Cymbalta when able to tolerate p.o.  Adenocarcinoma of head of pancreas (HCC) -Diagnosed in 2017 -Status post chemotherapy and radiation at the time -Status post Whipple procedure -Now with recurrence of disease in recent months -Patient is expected to undergo palliative outpatient radiation therapy in the coming weeks. -May consult his oncologist while he is inpatient -Likely resume his Creon  Chronic Diastolic CHF (congestive heart failure) (HCC) -No evidence of volume overload at this time -Strict I's and O's and daily weights will need to continue monitor for signs and symptoms of volume overload given his aggressive fluid hydration  BPH with urinary obstruction -Foley catheter was placed during last hospitalization due to  outlet obstruction and remains in place today. -Foley catheter has been exchanged during this hospitalization -Continuing home regimen of tamsulosin  Pressure ulcer of sacral region, stage 2 ([place]) -Wound care consultation placed -Present on admission  GERD without esophagitis -Daily PPI IV pantoprazole 40 mg every 24 for  now  Hypokalemia -Patient's potassium dropped to 3.0 in the setting of his insulin drip -Replete with IV K-Phos 10 mmol as well as IV KCl 40 mEq -Mag level was 1.7 so this will also be repeated  -Continue monitor and replete as necessary  -repeat CMP in a.m.  Hypophosphatemia -Patient's phosphorus level was 2.4 -Replete with IV K-Phos 10 mmol -Continue to monitor replete as necessary -Repeat phosphorus level in a.m.  Hip fracture in the setting of fall status post intramedullary nailing -Patient is postoperative day 12 and this was done by Dr. Lyla Glassing on 10/17/2020 -We will need PT OT to evaluate and treat  DVT prophylaxis: SCDs; will add DVT prophylaxis in the morning given his recent drain as his Eliquis 2.5 mg p.o. twice daily has been held Code Status: FULL CODE Family Communication: No family present at bedside and called wife on her cell phone with no answer  Disposition Plan: Pending further clinical improvement and tolerance of diet and clearance by specialist  Status is: Inpatient  Remains inpatient appropriate because:Unsafe d/c plan, IV treatments appropriate due to intensity of illness or inability to take PO and Inpatient level of care appropriate due to severity of illness   Dispo: The patient is from: SNF              Anticipated d/c is to: SNF              Patient currently is not medically stable to d/c.   Difficult to place patient No  Consultants:   IR  Infectious Diseases    Procedures:  Procedure: Image guided drain placement, 2 x hepatic abscess.  2 x 56F pigtail drain into right liver.  ECHOCARDIOGRAM Done and pending read  Antimicrobials:  Anti-infectives (From admission, onward)   Start     Dose/Rate Route Frequency Ordered Stop   11/06/20 1800  Ampicillin-Sulbactam (UNASYN) 3 g in sodium chloride 0.9 % 100 mL IVPB        3 g 200 mL/hr over 30 Minutes Intravenous Every 6 hours 11/06/20 1442     11/06/20 1100  cefTRIAXone (ROCEPHIN) 2 g  in sodium chloride 0.9 % 100 mL IVPB  Status:  Discontinued        2 g 200 mL/hr over 30 Minutes Intravenous Every 24 hours 11/06/20 0855 11/06/20 1435   11/06/20 0800  vancomycin (VANCOREADY) IVPB 1250 mg/250 mL  Status:  Discontinued        1,250 mg 166.7 mL/hr over 90 Minutes Intravenous Every 12 hours 11/06/20 0643 11/06/20 0854   11/06/20 0700  metroNIDAZOLE (FLAGYL) IVPB 500 mg  Status:  Discontinued        500 mg 100 mL/hr over 60 Minutes Intravenous Every 8 hours 11/06/20 0638 11/06/20 1452   11/06/20 0400  ceFEPIme (MAXIPIME) 2 g in sodium chloride 0.9 % 100 mL IVPB  Status:  Discontinued        2 g 200 mL/hr over 30 Minutes Intravenous Every 8 hours 11/05/20 2358 11/06/20 0853   11/05/20 1845  vancomycin (VANCOREADY) IVPB 1500 mg/300 mL        1,500 mg 150 mL/hr over 120 Minutes Intravenous  Once 11/05/20 1830 11/05/20 2237   11/05/20  1830  ceFEPIme (MAXIPIME) 2 g in sodium chloride 0.9 % 100 mL IVPB        2 g 200 mL/hr over 30 Minutes Intravenous  Once 11/05/20 1824 11/05/20 2103   11/05/20 1830  metroNIDAZOLE (FLAGYL) IVPB 500 mg        500 mg 100 mL/hr over 60 Minutes Intravenous  Once 11/05/20 1824 11/05/20 2140   11/05/20 1830  vancomycin (VANCOCIN) IVPB 1000 mg/200 mL premix  Status:  Discontinued        1,000 mg 200 mL/hr over 60 Minutes Intravenous  Once 11/05/20 1824 11/05/20 1830       Subjective: Seen and examined at bedside this morning he is little lethargic and not really as responsive.  Had some pain in the right side of his abdomen.  No nausea or vomiting.  No other concerns or complaints this time and very lethargic this morning on examination.  Objective: Vitals:   11/06/20 1520 11/06/20 1525 11/06/20 1530 11/06/20 1600  BP: 138/74 138/71 138/70 118/71  Pulse: (!) 102 (!) 102 (!) 102   Resp: _0 Temp:    100.3 F (37.9 C)  TempSrc:    Axillary  SpO2: 98% 99% 98%   Weight:      Height:        Intake/Output Summary (Last 24 hours) at  11/06/2020 1837 Last data filed at 11/06/2020 1537 Gross per 24 hour  Intake 2736.65 ml  Output 2138 ml  Net 598.65 ml   Filed Weights   11/05/20 1748 11/06/20 0800 11/06/20 0903  Weight: 89 kg 79 kg 79 kg   Examination: Physical Exam:  Constitutional: Chronically ill-appearing Caucasian elderly male in no acute distress appears calm but is very lethargic Eyes: Lids and conjunctivae normal, sclerae anicteric  ENMT: External Ears, Nose appear normal. Grossly normal hearing.  Neck: Appears normal, supple, no cervical masses, normal ROM, no appreciable thyromegaly; no JVD Respiratory: Diminished to auscultation bilaterally, no wheezing, rales, rhonchi or crackles. Normal respiratory effort and patient is not tachypenic. No accessory muscle use.  With coarse breath sounds Cardiovascular: Slightly tachycardic, no murmurs / rubs / gallops. S1 and S2 auscultated.  No appreciable extremity edema Abdomen: Soft, slightly-tender, non-distended.  Bowel sounds positive.  GU: Deferred. Musculoskeletal: No clubbing / cyanosis of digits/nails.  Right leg has some incisions from his hip surgery that is clean dry intact Skin: No rashes, lesions, ulcers on limited skin evaluation I did not turn him to see his backside. No induration; Warm and dry.  Neurologic: CN 2-12 grossly intact with no focal deficits. Romberg sign and cerebellar reflexes not assessed.  Psychiatric: Impaired judgment and insight.  He is awake very lethargic and he is not fully alert and oriented x 3. Normal mood and appropriate affect.   Data Reviewed: I have personally reviewed following labs and imaging studies  CBC: Recent Labs  Lab 11/05/20 1930 11/05/20 2007 11/06/20 0337  WBC 21.3*  --  31.0*  NEUTROABS 20.1*  --  29.4*  HGB 12.5* 12.9* 11.0*  HCT 39.1 38.0* 35.6*  MCV 94.7  --  95.4  PLT 529*  --  417*   Basic Metabolic Panel: Recent Labs  Lab 11/05/20 1930 11/05/20 2007 11/06/20 0337 11/06/20 0716 11/06/20 1123  11/06/20 1424  NA 134* 136 137 136 140 139  K 4.3 4.4 4.4 3.9 3.2* 3.0*  CL 102 104 107 109 111 110  CO2 14*  --  10* 14* 21* 21*  GLUCOSE 352*  363* 364* 293* 159* 139*  BUN _0 CREATININE 0.72 0.50* 0.89 0.77 0.62 0.57*  CALCIUM 8.6*  --  8.7* 8.4* 8.6* 8.3*  MG  --   --  1.8  --   --  1.7  PHOS  --   --   --   --   --  2.4*   GFR: Estimated Creatinine Clearance: 92.5 mL/min (A) (by C-G formula based on SCr of 0.57 mg/dL (L)). Liver Function Tests: Recent Labs  Lab 11/05/20 1930 11/06/20 0337 11/06/20 1424  AST _1 ALT _2 ALKPHOS 336* 303* 252*  BILITOT 1.6* 1.9* 0.1*  PROT 8.2* 7.8 6.7  ALBUMIN 2.8* 2.6* 2.2*   No results for input(s): LIPASE, AMYLASE in the last 168 hours. No results for input(s): AMMONIA in the last 168 hours. Coagulation Profile: Recent Labs  Lab 11/05/20 1930 11/06/20 0337  INR 1.5* 1.6*   Cardiac Enzymes: No results for input(s): CKTOTAL, CKMB, CKMBINDEX, TROPONINI in the last 168 hours. BNP (last 3 results) No results for input(s): PROBNP in the last 8760 hours. HbA1C: No results for input(s): HGBA1C in the last 72 hours. CBG: Recent Labs  Lab 11/06/20 1122 11/06/20 1220 11/06/20 1319 11/06/20 1423 11/06/20 1635  GLUCAP 162* 139* 139* 132* 145*   Lipid Profile: No results for input(s): CHOL, HDL, LDLCALC, TRIG, CHOLHDL, LDLDIRECT in the last 72 hours. Thyroid Function Tests: No results for input(s): TSH, T4TOTAL, FREET4, T3FREE, THYROIDAB in the last 72 hours. Anemia Panel: No results for input(s): VITAMINB12, FOLATE, FERRITIN, TIBC, IRON, RETICCTPCT in the last 72 hours. Sepsis Labs: Recent Labs  Lab 11/05/20 1914 11/06/20 0337  PROCALCITON  --  0.56  0.56  LATICACIDVEN 1.6  --     Recent Results (from the past 240 hour(s))  Resp Panel by RT-PCR (Flu A&B, Covid) Nasopharyngeal Swab     Status: None   Collection Time: 11/05/20  7:14 PM   Specimen: Nasopharyngeal Swab; Nasopharyngeal(NP) swabs  in vial transport medium  Result Value Ref Range Status   SARS Coronavirus 2 by RT PCR NEGATIVE NEGATIVE Final    Comment: (NOTE) SARS-CoV-2 target nucleic acids are NOT DETECTED.  The SARS-CoV-2 RNA is generally detectable in upper respiratory specimens during the acute phase of infection. The lowest concentration of SARS-CoV-2 viral copies this assay can detect is 138 copies/mL. A negative result does not preclude SARS-Cov-2 infection and should not be used as the sole basis for treatment or other patient management decisions. A negative result may occur with  improper specimen collection/handling, submission of specimen other than nasopharyngeal swab, presence of viral mutation(s) within the areas targeted by this assay, and inadequate number of viral copies(<138 copies/mL). A negative result must be combined with clinical observations, patient history, and epidemiological information. The expected result is Negative.  Fact Sheet for Patients:  EntrepreneurPulse.com.au  Fact Sheet for Healthcare Providers:  IncredibleEmployment.be  This test is no t yet approved or cleared by the [place] FDA and  has been authorized for detection and/or diagnosis of SARS-CoV-2 by FDA under an Emergency Use Authorization (EUA). This EUA will remain  in effect (meaning this test can be used) for the duration of the COVID-19 declaration under Section 564(b)(1) of the Act, 21 U.S.C.section 360bbb-3(b)(1), unless the authorization is terminated  or revoked sooner.       Influenza A by PCR NEGATIVE NEGATIVE Final   Influenza B by PCR NEGATIVE NEGATIVE Final  Comment: (NOTE) The Xpert Xpress SARS-CoV-2/FLU/RSV plus assay is intended as an aid in the diagnosis of influenza from Nasopharyngeal swab specimens and should not be used as a sole basis for treatment. Nasal washings and aspirates are unacceptable for Xpert Xpress  SARS-CoV-2/FLU/RSV testing.  Fact Sheet for Patients: EntrepreneurPulse.com.au  Fact Sheet for Healthcare Providers: IncredibleEmployment.be  This test is not yet approved or cleared by the [place] FDA and has been authorized for detection and/or diagnosis of SARS-CoV-2 by FDA under an Emergency Use Authorization (EUA). This EUA will remain in effect (meaning this test can be used) for the duration of the COVID-19 declaration under Section 564(b)(1) of the Act, 21 U.S.C. section 360bbb-3(b)(1), unless the authorization is terminated or revoked.  Performed at [facility], [place] [address] [place], [place] [address]   Blood Culture (routine x 2)     Status: None (Preliminary result)   Collection Time: 11/05/20  8:36 PM   Specimen: BLOOD  Result Value Ref Range Status   Specimen Description   Final    BLOOD BLOOD RIGHT FOREARM Performed at [facility] [address] [place], [place] [address]    Special Requests   Final    BOTTLES DRAWN AEROBIC AND ANAEROBIC Blood Culture results may not be optimal due to an inadequate volume of blood received in culture bottles Performed at [place] [facility] [address], [place], [place] [address]    Culture   Final    NO GROWTH < 12 HOURS Performed at [place] [facility] [address] [place], [place] [address]    Report Status PENDING  Incomplete  Blood Culture (routine x 2)     Status: None (Preliminary result)   Collection Time: 11/05/20  8:38 PM   Specimen: BLOOD  Result Value Ref Range Status   Specimen Description   Final    BLOOD Performed at [facility] [address] [place], [place] [address]    Special Requests   Final    BOTTLES DRAWN AEROBIC AND ANAEROBIC Blood Culture results may not be optimal due to an inadequate volume of blood received in culture bottles Performed at [facility] [address], [place], [place] [address]    Culture   Final    NO GROWTH < 12 HOURS Performed at [facility] [address], [place], [place] [address]    Report Status PENDING  Incomplete  MRSA PCR Screening     Status: None   Collection Time: 11/06/20  8:42 AM   Specimen: Nasopharyngeal  Result Value Ref Range Status   MRSA by PCR NEGATIVE NEGATIVE Final    Comment:        The GeneXpert MRSA Assay (FDA approved for NASAL specimens only), is one component of a comprehensive MRSA colonization surveillance program. It is not intended to diagnose MRSA infection nor to guide or monitor treatment for MRSA infections. Performed at [facility], [place] [address] [place], [place] [address]      RN Pressure Injury Documentation:     Estimated body mass index is 24.99 kg/m as calculated from the following:   Height as of this encounter: $RemoveBeforeD'5\' 10"'[id number]$  (1.778 m).   Weight as of this encounter: 79 kg.  Malnutrition Type:   Malnutrition Characteristics:   Nutrition Interventions:    Radiology Studies: CT CHEST ABDOMEN PELVIS W CONTRAST  Result Date: 11/06/2020 CLINICAL DATA:  Pyelonephritis.  Complicated sepsis. EXAM: CT  CHEST, ABDOMEN, AND PELVIS WITH CONTRAST TECHNIQUE: Multidetector CT imaging of the chest, abdomen and pelvis was performed following the standard protocol during bolus administration of intravenous contrast. CONTRAST:  145m OMNIPAQUE IOHEXOL 300 MG/ML  SOLN COMPARISON:  Outside abdominal CT 10/14/2020 FINDINGS: CT CHEST FINDINGS Cardiovascular: Normal heart size. No pericardial effusion. Coronary atherosclerosis. Limited opacification of the pulmonary arteries. Mediastinum/Nodes: Negative for adenopathy or mass. Lungs/Pleura: Airspace opacity in the subpleural left lower lobe and patchy reticulonodular density in the bilateral lower lobes. No edema or effusion. No pneumothorax. Musculoskeletal: No acute finding.  Mild scoliosis CT ABDOMEN PELVIS  FINDINGS Hepatobiliary: Sequela of Whipple procedure.There is soft tissue density at the liver hilum better demonstrated on prior pancreas protocol CT with arterial narrowings described on that study. Intrahepatic bile duct dilatation which is similar to previous. Left lobe of the liver is atrophic in the setting of chronic left portal vein occlusion and hepatic arterial narrowings. There is a cluster of new low-density areas in the posterior right lobe liver with the largest collection measuring 4.6 cm. Mild hypo perfusion in the adjacent parenchyma. Pancreas: Status post Whipple procedure. Atrophy of the remaining pancreas. Soft tissue density around the celiac branches as described on outside CT staging. No acute pancreatitis is seen. Spleen: Negative Adrenals/Urinary Tract: Negative adrenals. No hydronephrosis or stone. Unremarkable bladder which is decompressed by a Foley catheter. Stomach/Bowel: No evidence of bowel obstruction. Sequela of local procedure. Rectal stool distention with mild mesorectal edema. The rectum measures up to 8 cm in diameter. Vascular/Lymphatic: Celiac branch narrowings and chronic left portal vein occlusion as described above. Atheromatous calcification. No discrete retroperitoneal nodes. Reproductive:No pathologic findings. Other: No ascites or pneumoperitoneum. Musculoskeletal: Intertrochanteric right femur fracture with recent repair. No acute osseous finding. Lumbar spine degeneration and scoliosis. Message was sent to the clinical team via secure chat in epic. IMPRESSION: 1. Cluster of bilomas/abscess in the posterior right lobe liver with the largest component measuring up to 4.6 cm. Bile duct dilatation is similar to recent staging scan 08/31/2020. 2. Whipple procedure for pancreas carcinoma with infiltrative soft tissue about the narrowed celiac branches and hepatic hilum, [facility]. 3. No CT findings to correlate with history  of pyelonephritis. 4. Airspace disease in the bilateral lower lobes compatible with pneumonia or aspiration. Notable subpleural involvement in the left lower lobe is likely consolidation given the overall pattern but please have low threshold for lower extremity Doppler ultrasound given recent right femur fracture repair. 5. Moderate stool distended rectum with mesorectal edema. Electronically Signed   By: JMonte FantasiaM.D.   On: 11/06/2020 06:29   DG Chest Port 1 View  Result Date: 11/05/2020 CLINICAL DATA:  Sepsis. EXAM: PORTABLE CHEST 1 VIEW COMPARISON:  Chest x-ray 10/18/2020 FINDINGS: The cardiac silhouette, mediastinal and hilar contours are within normal limits. The lungs are clear of an acute process. No pulmonary lesions or pleural effusion. The bony thorax is intact. IMPRESSION: No acute cardiopulmonary findings. Electronically Signed   By: PMarijo SanesM.D.   On: 11/05/2020 18:53   Scheduled Meds: . Chlorhexidine Gluconate Cloth  6 each Topical Daily  . flumazenil      . insulin aspart  0-9 Units Subcutaneous Q4H  . insulin detemir  20 Units Subcutaneous BID  . loratadine  10 mg Oral Daily  . mouth rinse  15 mL Mouth Rinse BID  . midazolam      . naloxone      . pantoprazole (PROTONIX) IV  40 mg Intravenous Q24H  . tamsulosin  0.4 mg Oral Daily   Continuous Infusions: . ampicillin-sulbactam (UNASYN) IV    . dextrose 5% lactated ringers 150 mL/hr at 11/06/20 1330  . insulin 2.8 mL/hr at 11/06/20 1330  . magnesium sulfate bolus IVPB 2 g (11/06/20 1810)  . potassium chloride    . potassium PHOSPHATE IVPB (in mmol) 10 mmol (11/06/20 1749)  . sodium chloride      LOS: 0 days   Kerney Elbe, DO [facility] PAGER is on [place]  If 7PM-7AM, please contact night-coverage [email or web address]

## 2020-11-07 ENCOUNTER — Inpatient Hospital Stay (HOSPITAL_COMMUNITY): Payer: Medicare Other

## 2020-11-07 DIAGNOSIS — A408 Other streptococcal sepsis: Principal | ICD-10-CM

## 2020-11-07 DIAGNOSIS — A419 Sepsis, unspecified organism: Secondary | ICD-10-CM | POA: Diagnosis not present

## 2020-11-07 DIAGNOSIS — E1110 Type 2 diabetes mellitus with ketoacidosis without coma: Secondary | ICD-10-CM

## 2020-11-07 DIAGNOSIS — Z794 Long term (current) use of insulin: Secondary | ICD-10-CM

## 2020-11-07 DIAGNOSIS — I5032 Chronic diastolic (congestive) heart failure: Secondary | ICD-10-CM | POA: Diagnosis not present

## 2020-11-07 DIAGNOSIS — L89152 Pressure ulcer of sacral region, stage 2: Secondary | ICD-10-CM

## 2020-11-07 DIAGNOSIS — C250 Malignant neoplasm of head of pancreas: Secondary | ICD-10-CM | POA: Diagnosis not present

## 2020-11-07 DIAGNOSIS — N401 Enlarged prostate with lower urinary tract symptoms: Secondary | ICD-10-CM | POA: Diagnosis not present

## 2020-11-07 LAB — CBC WITH DIFFERENTIAL/PLATELET
Abs Immature Granulocytes: 0.12 10*3/uL — ABNORMAL HIGH (ref 0.00–0.07)
Basophils Absolute: 0.1 10*3/uL (ref 0.0–0.1)
Basophils Relative: 0 %
Eosinophils Absolute: 0 10*3/uL (ref 0.0–0.5)
Eosinophils Relative: 0 %
HCT: 31.7 % — ABNORMAL LOW (ref 39.0–52.0)
Hemoglobin: 10.3 g/dL — ABNORMAL LOW (ref 13.0–17.0)
Immature Granulocytes: 1 %
Lymphocytes Relative: 4 %
Lymphs Abs: 0.8 10*3/uL (ref 0.7–4.0)
MCH: 30.2 pg (ref 26.0–34.0)
MCHC: 32.5 g/dL (ref 30.0–36.0)
MCV: 93 fL (ref 80.0–100.0)
Monocytes Absolute: 0.8 10*3/uL (ref 0.1–1.0)
Monocytes Relative: 3 %
Neutro Abs: 20.9 10*3/uL — ABNORMAL HIGH (ref 1.7–7.7)
Neutrophils Relative %: 92 %
Platelets: 470 10*3/uL — ABNORMAL HIGH (ref 150–400)
RBC: 3.41 MIL/uL — ABNORMAL LOW (ref 4.22–5.81)
RDW: 14.7 % (ref 11.5–15.5)
WBC: 22.6 10*3/uL — ABNORMAL HIGH (ref 4.0–10.5)
nRBC: 0 % (ref 0.0–0.2)

## 2020-11-07 LAB — COMPREHENSIVE METABOLIC PANEL
ALT: 28 U/L (ref 0–44)
AST: 45 U/L — ABNORMAL HIGH (ref 15–41)
Albumin: 2.1 g/dL — ABNORMAL LOW (ref 3.5–5.0)
Alkaline Phosphatase: 230 U/L — ABNORMAL HIGH (ref 38–126)
Anion gap: 9 (ref 5–15)
BUN: 14 mg/dL (ref 8–23)
CO2: 21 mmol/L — ABNORMAL LOW (ref 22–32)
Calcium: 8.2 mg/dL — ABNORMAL LOW (ref 8.9–10.3)
Chloride: 107 mmol/L (ref 98–111)
Creatinine, Ser: 0.43 mg/dL — ABNORMAL LOW (ref 0.61–1.24)
GFR, Estimated: >60 mL/min (ref >60)
Glucose, Bld: 169 mg/dL — ABNORMAL HIGH (ref 70–99)
Potassium: 3.2 mmol/L — ABNORMAL LOW (ref 3.5–5.1)
Sodium: 137 mmol/L (ref 135–145)
Total Bilirubin: 0.8 mg/dL (ref 0.3–1.2)
Total Protein: 6.2 g/dL — ABNORMAL LOW (ref 6.5–8.1)

## 2020-11-07 LAB — MAGNESIUM: Magnesium: 1.9 mg/dL (ref 1.7–2.4)

## 2020-11-07 LAB — AMMONIA: Ammonia: 19 umol/L (ref 9–35)

## 2020-11-07 LAB — GLUCOSE, CAPILLARY
Glucose-Capillary: 125 mg/dL — ABNORMAL HIGH (ref 70–99)
Glucose-Capillary: 135 mg/dL — ABNORMAL HIGH (ref 70–99)
Glucose-Capillary: 159 mg/dL — ABNORMAL HIGH (ref 70–99)
Glucose-Capillary: 160 mg/dL — ABNORMAL HIGH (ref 70–99)
Glucose-Capillary: 161 mg/dL — ABNORMAL HIGH (ref 70–99)
Glucose-Capillary: 79 mg/dL (ref 70–99)
Glucose-Capillary: 87 mg/dL (ref 70–99)

## 2020-11-07 LAB — PHOSPHORUS: Phosphorus: 2.2 mg/dL — ABNORMAL LOW (ref 2.5–4.6)

## 2020-11-07 LAB — PROCALCITONIN: Procalcitonin: 0.59 ng/mL

## 2020-11-07 LAB — BETA-HYDROXYBUTYRIC ACID: Beta-Hydroxybutyric Acid: 0.7 mmol/L — ABNORMAL HIGH (ref 0.05–0.27)

## 2020-11-07 MED ORDER — POTASSIUM CHLORIDE 10 MEQ/100ML IV SOLN
10.0000 meq | INTRAVENOUS | Status: AC
  Administered 2020-11-07 (×3): 10 meq via INTRAVENOUS
  Filled 2020-11-07 (×3): qty 100

## 2020-11-07 MED ORDER — LEVALBUTEROL HCL 0.63 MG/3ML IN NEBU
0.6300 mg | INHALATION_SOLUTION | Freq: Four times a day (QID) | RESPIRATORY_TRACT | Status: DC
  Administered 2020-11-07: 0.63 mg via RESPIRATORY_TRACT
  Filled 2020-11-07: qty 3

## 2020-11-07 MED ORDER — LORAZEPAM 2 MG/ML IJ SOLN
1.0000 mg | Freq: Four times a day (QID) | INTRAMUSCULAR | Status: DC | PRN
  Administered 2020-11-08: 1 mg via INTRAVENOUS
  Filled 2020-11-07: qty 1

## 2020-11-07 MED ORDER — SODIUM CHLORIDE 0.9% FLUSH
5.0000 mL | Freq: Three times a day (TID) | INTRAVENOUS | Status: DC
  Administered 2020-11-07 – 2020-11-12 (×15): 5 mL

## 2020-11-07 MED ORDER — SODIUM CHLORIDE 0.9 % IV SOLN
3.0000 g | Freq: Four times a day (QID) | INTRAVENOUS | Status: DC
  Administered 2020-11-07 – 2020-11-09 (×7): 3 g via INTRAVENOUS
  Filled 2020-11-07: qty 3
  Filled 2020-11-07 (×9): qty 8

## 2020-11-07 MED ORDER — SODIUM CHLORIDE 0.9 % IV SOLN: 3.0000 g | Freq: Four times a day (QID) | INTRAVENOUS | Status: DC

## 2020-11-07 MED ORDER — IPRATROPIUM BROMIDE 0.02 % IN SOLN
0.5000 mg | Freq: Four times a day (QID) | RESPIRATORY_TRACT | Status: DC
  Administered 2020-11-07: 0.5 mg via RESPIRATORY_TRACT
  Filled 2020-11-07: qty 2.5

## 2020-11-07 MED ORDER — POTASSIUM PHOSPHATES 15 MMOLE/5ML IV SOLN
20.0000 mmol | Freq: Once | INTRAVENOUS | Status: AC
  Administered 2020-11-07: 20 mmol via INTRAVENOUS
  Filled 2020-11-07: qty 6.67

## 2020-11-07 MED ORDER — IPRATROPIUM BROMIDE 0.02 % IN SOLN
0.5000 mg | Freq: Two times a day (BID) | RESPIRATORY_TRACT | Status: DC
  Administered 2020-11-07 – 2020-11-12 (×10): 0.5 mg via RESPIRATORY_TRACT
  Filled 2020-11-07 (×10): qty 2.5

## 2020-11-07 MED ORDER — ENOXAPARIN SODIUM 40 MG/0.4ML ~~LOC~~ SOLN
40.0000 mg | SUBCUTANEOUS | Status: DC
  Administered 2020-11-07: 40 mg via SUBCUTANEOUS
  Filled 2020-11-07: qty 0.4

## 2020-11-07 MED ORDER — POTASSIUM CHLORIDE 10 MEQ/100ML IV SOLN: 10.0000 meq | INTRAVENOUS | Status: DC

## 2020-11-07 MED ORDER — SODIUM CHLORIDE 0.9 % IV SOLN
3.0000 g | Freq: Once | INTRAVENOUS | Status: AC
  Administered 2020-11-07: 3 g via INTRAVENOUS
  Filled 2020-11-07: qty 3

## 2020-11-07 MED ORDER — LEVALBUTEROL HCL 0.63 MG/3ML IN NEBU
0.6300 mg | INHALATION_SOLUTION | Freq: Two times a day (BID) | RESPIRATORY_TRACT | Status: DC
  Administered 2020-11-07 – 2020-11-12 (×10): 0.63 mg via RESPIRATORY_TRACT
  Filled 2020-11-07 (×10): qty 3

## 2020-11-07 NOTE — Evaluation (Signed)
Occupational Therapy Evaluation Patient Details Name: Colton Bush MRN: [id number] DOB: 1956/04/23 Today's Date: 11/07/2020    History of Present Illness Patient is a 65 year old Caucasian male with a past medical history significant for but not limited to pancreatic cancer diagnosed in 2017 with recent recurrence, diabetes mellitus type 2, hypertension, hyperlipidemia, chronic diastolic CHF with an EF of 55 to 60% and grade 1 diastolic dysfunction back in March 2022, recent right hip fracture status post repair and intramedullary nail as well as other comorbidities who presented to [facility] long ED with a chief complaint of a sudden change in mental status. In the ED is found to be septic and initial source was thought to be another UTI but then further work-up revealed that he had a liver abscess.  2 drains placed in his liver abscess   Clinical Impression   Mr. Colton Bush presents with above pertinent medical history with generalized weakness, decreased activity tolerance, decreased ROM and strength of right LE, and impaired balance resulting in a decline in functional abilities. Patient has been at rehab recovering after hip fracture and reports ambulating "a little." Communication difficult today as patient hard to understand and not overly detailed. Patient's BP 100/57 at rest and dropped to 93/41 in sitting. Patient needed mod assist for bed transfers and unable to tolerate standing today. Patient needing seated positioning, set up and/or  physical assistance for ADLs. Patient will benefit from skilled OT services while in hospital to improve deficits and learn compensatory strategies as needed in order to return PLOF.      Follow Up Recommendations  SNF    Equipment Recommendations  None recommended by OT    Recommendations for Other Services       Precautions / Restrictions Precautions Precautions: Fall Restrictions Weight Bearing Restrictions: No RLE Weight Bearing: Weight  bearing as tolerated Other Position/Activity Restrictions: WBAT      Mobility Bed Mobility Overal bed mobility: Needs Assistance Bed Mobility: Supine to Sit     Supine to sit: Mod assist;HOB elevated;+2 for safety/equipment;+2 for physical assistance Sit to supine: Mod assist;+2 for physical assistance;+2 for safety/equipment        Transfers                      Balance Overall balance assessment: History of Falls;Needs assistance Sitting-balance support: No upper extremity supported;Feet supported Sitting balance-[name]: Fair                                     ADL either performed or assessed with clinical judgement   ADL Overall ADL's : Needs assistance/impaired Eating/Feeding: Set up;Sitting   Grooming: Set up;Sitting   Upper Body Bathing: Minimal assistance;Set up;Sitting   Lower Body Bathing: Maximal assistance;Set up;Bed level   Upper Body Dressing : Moderate assistance;Set up;Sitting   Lower Body Dressing: Total assistance;Bed level     Toilet Transfer Details (indicate cue type and reason): unable Toileting- Clothing Manipulation and Hygiene: Total assistance;Bed level               Vision   Vision Assessment?: No apparent visual deficits     Perception     Praxis      Pertinent Vitals/Pain Pain Assessment: No/denies pain     Hand Dominance Right   Extremity/Trunk Assessment Upper Extremity Assessment Upper Extremity Assessment: Generalized weakness   Lower Extremity Assessment Lower Extremity Assessment: Generalized weakness  Communication Communication Communication: Expressive difficulties   Cognition Arousal/Alertness: Awake/alert Behavior During Therapy: Flat affect Overall Cognitive Status: Within Functional Limits for tasks assessed                                 General Comments: Able to follow commands and answer questions. Details limited. At times difficult to  understand.   General Comments       Exercises     Shoulder Instructions      Home Living Family/patient expects to be discharged to:: Skilled nursing facility                                        Prior Functioning/Environment Level of Independence: Needs assistance  Gait / Transfers Assistance Needed: limited mobility with RW ADL's / Homemaking Assistance Needed: needs assistance   Comments: Independent prior to R hip fx last month.  Pt reports ambulating "some" at SNF        OT Problem List: Decreased strength;Decreased activity tolerance;Impaired balance (sitting and/or standing);Decreased safety awareness;Decreased knowledge of use of DME or AE;Decreased knowledge of precautions;Pain      OT Treatment/Interventions: Self-care/ADL training;DME and/or AE instruction;Therapeutic activities;Patient/family education;Balance training;Therapeutic exercise    OT Goals(Current goals can be found in the care plan section) Acute Rehab OT Goals Patient Stated Goal: Did not state Time For Goal Achievement: 11/21/20 Potential to Achieve Goals: Good  OT Frequency: Min 2X/week   Barriers to D/C:            Co-evaluation PT/OT/SLP Co-Evaluation/Treatment: Yes Reason for Co-Treatment: For patient/therapist safety;To address functional/ADL transfers (co eval)          AM-PAC OT "6 Clicks" Daily Activity     Outcome Measure Help from another person eating meals?: A Little Help from another person taking care of personal grooming?: A Little Help from another person toileting, which includes using toliet, bedpan, or urinal?: Total Help from another person bathing (including washing, rinsing, drying)?: A Lot Help from another person to put on and taking off regular upper body clothing?: A Lot Help from another person to put on and taking off regular lower body clothing?: Total 6 Click Score: 12   End of Session Nurse Communication: Mobility status  Activity  Tolerance: Patient limited by fatigue Patient left: in bed;with call bell/phone within reach;with bed alarm set  OT Visit Diagnosis: Unsteadiness on feet (R26.81);Other abnormalities of gait and mobility (R26.89);Muscle weakness (generalized) (M62.81);History of falling (Z91.81)                Time: 1420-1436 OT Time Calculation (min): 16 min Charges:  OT General Charges $OT Visit: 1 Visit OT Evaluation $OT Eval Moderate Complexity: 1 Mod  Barnes Florek, OTR/L [place]  Office [phone] Pager: [place] 11/07/2020, 3:47 PM

## 2020-11-07 NOTE — Progress Notes (Signed)
Subjective:  Patient confused   Antibiotics:  Anti-infectives (From admission, onward)   Start     Dose/Rate Route Frequency Ordered Stop   11/07/20 2200  Ampicillin-Sulbactam (UNASYN) 3 g in sodium chloride 0.9 % 100 mL IVPB  Status:  Discontinued        3 g 200 mL/hr over 30 Minutes Intravenous Every 6 hours 11/06/20 2135 11/07/20 0943   11/07/20 1600  Ampicillin-Sulbactam (UNASYN) 3 g in sodium chloride 0.9 % 100 mL IVPB        3 g 200 mL/hr over 30 Minutes Intravenous Every 6 hours 11/07/20 0943     11/07/20 1030  Ampicillin-Sulbactam (UNASYN) 3 g in sodium chloride 0.9 % 100 mL IVPB        3 g 200 mL/hr over 30 Minutes Intravenous  Once 11/07/20 0943     11/06/20 1800  Ampicillin-Sulbactam (UNASYN) 3 g in sodium chloride 0.9 % 100 mL IVPB  Status:  Discontinued        3 g 200 mL/hr over 30 Minutes Intravenous Every 6 hours 11/06/20 1442 11/06/20 2135   11/06/20 1100  cefTRIAXone (ROCEPHIN) 2 g in sodium chloride 0.9 % 100 mL IVPB  Status:  Discontinued        2 g 200 mL/hr over 30 Minutes Intravenous Every 24 hours 11/06/20 0855 11/06/20 1435   11/06/20 0800  vancomycin (VANCOREADY) IVPB 1250 mg/250 mL  Status:  Discontinued        1,250 mg 166.7 mL/hr over 90 Minutes Intravenous Every 12 hours 11/06/20 0643 11/06/20 0854   11/06/20 0700  metroNIDAZOLE (FLAGYL) IVPB 500 mg  Status:  Discontinued        500 mg 100 mL/hr over 60 Minutes Intravenous Every 8 hours 11/06/20 0638 11/06/20 1452   11/06/20 0400  ceFEPIme (MAXIPIME) 2 g in sodium chloride 0.9 % 100 mL IVPB  Status:  Discontinued        2 g 200 mL/hr over 30 Minutes Intravenous Every 8 hours 11/05/20 2358 11/06/20 0853   11/05/20 1845  vancomycin (VANCOREADY) IVPB 1500 mg/300 mL        1,500 mg 150 mL/hr over 120 Minutes Intravenous  Once 11/05/20 1830 11/05/20 2237   11/05/20 1830  ceFEPIme (MAXIPIME) 2 g in sodium chloride 0.9 % 100 mL IVPB        2 g 200 mL/hr over 30 Minutes Intravenous  Once  11/05/20 1824 11/05/20 2103   11/05/20 1830  metroNIDAZOLE (FLAGYL) IVPB 500 mg        500 mg 100 mL/hr over 60 Minutes Intravenous  Once 11/05/20 1824 11/05/20 2140   11/05/20 1830  vancomycin (VANCOCIN) IVPB 1000 mg/200 mL premix  Status:  Discontinued        1,000 mg 200 mL/hr over 60 Minutes Intravenous  Once 11/05/20 1824 11/05/20 1830      Medications: Scheduled Meds: . Chlorhexidine Gluconate Cloth  6 each Topical Daily  . insulin aspart  0-9 Units Subcutaneous Q4H  . insulin detemir  20 Units Subcutaneous BID  . ipratropium  0.5 mg Nebulization Q6H  . levalbuterol  0.63 mg Nebulization Q6H  . lipase/protease/amylase  12,000 Units Oral TID with meals  . loratadine  10 mg Oral Daily  . mouth rinse  15 mL Mouth Rinse BID  . pantoprazole (PROTONIX) IV  40 mg Intravenous Q24H  . tamsulosin  0.4 mg Oral Daily   Continuous Infusions: . ampicillin-sulbactam (UNASYN) IV    .  ampicillin-sulbactam (UNASYN) IV    . dextrose 5% lactated ringers 75 mL/hr at 11/07/20 0956   PRN Meds:.acetaminophen **OR** acetaminophen, dextrose, hydrALAZINE, ondansetron **OR** ondansetron (ZOFRAN) IV, polyethylene glycol    Objective: Weight change: -10 kg  Intake/Output Summary (Last 24 hours) at 11/07/2020 1125 Last data filed at 11/07/2020 0935 Gross per 24 hour  Intake 2328.18 ml  Output 1510 ml  Net 818.18 ml   Blood pressure (!) 112/50, pulse 78, temperature 98.5 F (36.9 C), temperature source Axillary, resp. rate 20, height 5\' 10"  (1.778 m), weight 79 kg, SpO2 95 %. Temp:  [98.5 F (36.9 C)-100.3 F (37.9 C)] 98.5 F (36.9 C) (04/10 0800) Pulse Rate:  [78-108] 78 (04/10 0500) Resp:  [11-24] 20 (04/10 0500) BP: (102-152)/(50-74) 112/50 (04/10 0500) SpO2:  [89 %-99 %] 95 % (04/10 0500)  Physical Exam: Physical Exam Constitutional:      Appearance: He is well-developed.  HENT:     Head: Normocephalic and atraumatic.  Eyes:     Conjunctiva/sclera: Conjunctivae normal.   Cardiovascular:     Rate and Rhythm: Normal rate and regular rhythm.     Heart sounds: No murmur heard. No friction rub. No gallop.   Pulmonary:     Effort: Pulmonary effort is normal. No respiratory distress.     Breath sounds: No stridor. No wheezing or rhonchi.  Abdominal:     General: There is no distension.     Palpations: Abdomen is soft.     Tenderness: There is no abdominal tenderness. There is no guarding.  Musculoskeletal:        General: Normal range of motion.     Cervical back: Normal range of motion and neck supple.  Skin:    General: Skin is warm and dry.     Findings: No erythema or rash.  Neurological:     Mental Status: He is alert. He is disoriented.     JP drains x 2  CBC:    BMET Recent Labs    11/06/20 2243 11/07/20 0124  NA 136 137  K 3.4* 3.2*  CL 108 107  CO2 21* 21*  GLUCOSE 153* 169*  BUN 14 14  CREATININE 0.48* 0.43*  CALCIUM 8.1* 8.2*     Liver Panel  Recent Labs    11/06/20 1424 11/07/20 0124  PROT 6.7 6.2*  ALBUMIN 2.2* 2.1*  AST 29 45*  ALT 22 28  ALKPHOS 252* 230*  BILITOT 0.1* 0.8       Sedimentation Rate No results for input(s): ESRSEDRATE in the last 72 hours. C-Reactive Protein Recent Labs    11/06/20 0337  CRP 19.4*    Micro Results: Recent Results (from the past 720 hour(s))  Resp Panel by RT-PCR (Flu A&B, Covid) Nasopharyngeal Swab     Status: None   Collection Time: 10/18/20  7:11 PM   Specimen: Nasopharyngeal Swab; Nasopharyngeal(NP) swabs in vial transport medium  Result Value Ref Range Status   SARS Coronavirus 2 by RT PCR NEGATIVE NEGATIVE Final    Comment: (NOTE) SARS-CoV-2 target nucleic acids are NOT DETECTED.  The SARS-CoV-2 RNA is generally detectable in upper respiratory specimens during the acute phase of infection. The lowest concentration of SARS-CoV-2 viral copies this assay can detect is 138 copies/mL. A negative result does not preclude SARS-Cov-2 infection and should not be  used as the sole basis for treatment or other patient management decisions. A negative result may occur with  improper specimen collection/handling, submission of specimen other than nasopharyngeal swab,  presence of viral mutation(s) within the areas targeted by this assay, and inadequate number of viral copies(<138 copies/mL). A negative result must be combined with clinical observations, patient history, and epidemiological information. The expected result is Negative.  Fact Sheet for Patients:  EntrepreneurPulse.com.au  Fact Sheet for Healthcare Providers:  IncredibleEmployment.be  This test is no t yet approved or cleared by the [place] FDA and  has been authorized for detection and/or diagnosis of SARS-CoV-2 by FDA under an Emergency Use Authorization (EUA). This EUA will remain  in effect (meaning this test can be used) for the duration of the COVID-19 declaration under Section 564(b)(1) of the Act, 21 U.S.C.section 360bbb-3(b)(1), unless the authorization is terminated  or revoked sooner.       Influenza A by PCR NEGATIVE NEGATIVE Final   Influenza B by PCR NEGATIVE NEGATIVE Final    Comment: (NOTE) The Xpert Xpress SARS-CoV-2/FLU/RSV plus assay is intended as an aid in the diagnosis of influenza from Nasopharyngeal swab specimens and should not be used as a sole basis for treatment. Nasal washings and aspirates are unacceptable for Xpert Xpress SARS-CoV-2/FLU/RSV testing.  Fact Sheet for Patients: EntrepreneurPulse.com.au  Fact Sheet for Healthcare Providers: IncredibleEmployment.be  This test is not yet approved or cleared by the [place] FDA and has been authorized for detection and/or diagnosis of SARS-CoV-2 by FDA under an Emergency Use Authorization (EUA). This EUA will remain in effect (meaning this test can be used) for the duration of the COVID-19 declaration under Section  564(b)(1) of the Act, 21 U.S.C. section 360bbb-3(b)(1), unless the authorization is terminated or revoked.  Performed at [facility], [place] [address], [place], [place] [address]   Surgical pcr screen     Status: Abnormal   Collection Time: 10/19/20 12:47 AM   Specimen: Nasal Mucosa; Nasal Swab  Result Value Ref Range Status   MRSA, PCR NEGATIVE NEGATIVE Final   Staphylococcus aureus POSITIVE (A) NEGATIVE Final    Comment: RESULT CALLED TO, READ BACK BY AND VERIFIED WITH: BOBBY, RN @ [phone] ON 10/19/20 C VARNER (NOTE) The Xpert SA Assay (FDA approved for NASAL specimens in patients 59 years of age and older), is one component of a comprehensive surveillance program. It is not intended to diagnose infection nor to guide or monitor treatment. Performed at [facility], [place] [address], [place], [place] [address]   Culture, blood (routine x 2)     Status: None   Collection Time: 10/20/20  7:55 AM   Specimen: BLOOD  Result Value Ref Range Status   Specimen Description   Final    BLOOD RIGHT ANTECUBITAL Performed at [place] [address] [place], [place] [address]    Special Requests   Final    BOTTLES DRAWN AEROBIC AND ANAEROBIC Blood Culture results may not be optimal due to an inadequate volume of blood received in culture bottles Performed at [place] [address], [place], [place] [address]    Culture   Final    NO GROWTH 5 DAYS Performed at [facility], [place] [address], [name], [place] [address]    Report Status 10/25/2020 FINAL  Final  Culture, blood (routine x 2)     Status: Abnormal   Collection Time: 10/20/20  7:56 AM   Specimen: BLOOD  Result Value Ref Range Status   Specimen Description   Final    BLOOD RIGHT ANTECUBITAL Performed at [place] [facility]  [address] [place], [place] [address]    Special Requests   Final    BOTTLES DRAWN AEROBIC AND  ANAEROBIC Blood Culture results may not be optimal due to an inadequate volume of blood received in culture bottles Performed at [place] [address] [place], [place] [address]    Culture  Setup Time   Final    AEROBIC BOTTLE ONLY GRAM POSITIVE COCCI CRITICAL RESULT CALLED TO, READ BACK BY AND VERIFIED WITHMelodye Ped [facility] 5573 10/21/20 A BROWNING Performed at [facility], [place] [address] [place], [place] [address]    Culture STREPTOCOCCUS ANGINOSIS (A)  Final   Report Status 10/24/2020 FINAL  Final   Organism ID, Bacteria STREPTOCOCCUS ANGINOSIS  Final      Susceptibility   Streptococcus anginosis - MIC*    PENICILLIN <=0.06 SENSITIVE Sensitive     CEFTRIAXONE 0.5 SENSITIVE Sensitive     ERYTHROMYCIN <=0.12 SENSITIVE Sensitive     LEVOFLOXACIN 0.5 SENSITIVE Sensitive     VANCOMYCIN 1 SENSITIVE Sensitive     * STREPTOCOCCUS ANGINOSIS  Blood Culture ID Panel (Reflexed)     Status: Abnormal   Collection Time: 10/20/20  7:56 AM  Result Value Ref Range Status   Enterococcus faecalis NOT DETECTED NOT DETECTED Final   Enterococcus Faecium NOT DETECTED NOT DETECTED Final   Listeria monocytogenes NOT DETECTED NOT DETECTED Final   Staphylococcus species NOT DETECTED NOT DETECTED Final   Staphylococcus aureus (BCID) NOT DETECTED NOT DETECTED Final   Staphylococcus epidermidis NOT DETECTED NOT DETECTED Final   Staphylococcus lugdunensis NOT DETECTED NOT DETECTED Final   Streptococcus species DETECTED (A) NOT DETECTED Final    Comment: Not Enterococcus species, Streptococcus agalactiae, Streptococcus pyogenes, or Streptococcus pneumoniae. CRITICAL RESULT CALLED TO, READ BACK BY AND VERIFIED WITH: Melodye Ped PHARMD 1316 10/21/20 A BROWNING    Streptococcus agalactiae NOT DETECTED NOT DETECTED Final   Streptococcus pneumoniae NOT DETECTED NOT DETECTED Final   Streptococcus pyogenes NOT DETECTED NOT DETECTED Final   A.calcoaceticus-baumannii NOT DETECTED NOT  DETECTED Final   Bacteroides fragilis NOT DETECTED NOT DETECTED Final   Enterobacterales NOT DETECTED NOT DETECTED Final   Enterobacter cloacae complex NOT DETECTED NOT DETECTED Final   Escherichia coli NOT DETECTED NOT DETECTED Final   Klebsiella aerogenes NOT DETECTED NOT DETECTED Final   Klebsiella oxytoca NOT DETECTED NOT DETECTED Final   Klebsiella pneumoniae NOT DETECTED NOT DETECTED Final   Proteus species NOT DETECTED NOT DETECTED Final   Salmonella species NOT DETECTED NOT DETECTED Final   Serratia marcescens NOT DETECTED NOT DETECTED Final   Haemophilus influenzae NOT DETECTED NOT DETECTED Final   Neisseria meningitidis NOT DETECTED NOT DETECTED Final   Pseudomonas aeruginosa NOT DETECTED NOT DETECTED Final   Stenotrophomonas maltophilia NOT DETECTED NOT DETECTED Final   Candida albicans NOT DETECTED NOT DETECTED Final   Candida auris NOT DETECTED NOT DETECTED Final   Candida glabrata NOT DETECTED NOT DETECTED Final   Candida krusei NOT DETECTED NOT DETECTED Final   Candida parapsilosis NOT DETECTED NOT DETECTED Final   Candida tropicalis NOT DETECTED NOT DETECTED Final   Cryptococcus neoformans/gattii NOT DETECTED NOT DETECTED Final    Comment: Performed at [facility], [address] [place], [place] [address]  Culture, Urine     Status: Abnormal   Collection Time: 10/20/20 10:25 AM   Specimen: Urine, Clean Catch  Result Value Ref Range Status   Specimen Description   Final    URINE, CLEAN CATCH Performed  at [facility], [place] [address], [place], [place] [address]    Special Requests   Final    NONE Performed at [facility], [place] [address] [place], [place] [address]    Culture >=100,000 COLONIES/mL ESCHERICHIA COLI (A)  Final   Report Status 10/23/2020 FINAL  Final   Organism ID, Bacteria ESCHERICHIA COLI (A)  Final      Susceptibility   Escherichia coli - MIC*    AMPICILLIN 4 SENSITIVE Sensitive     CEFAZOLIN <=4  SENSITIVE Sensitive     CEFEPIME <=0.12 SENSITIVE Sensitive     CEFTRIAXONE <=0.25 SENSITIVE Sensitive     CIPROFLOXACIN <=0.25 SENSITIVE Sensitive     GENTAMICIN <=1 SENSITIVE Sensitive     IMIPENEM <=0.25 SENSITIVE Sensitive     NITROFURANTOIN <=16 SENSITIVE Sensitive     TRIMETH/SULFA <=20 SENSITIVE Sensitive     AMPICILLIN/SULBACTAM <=2 SENSITIVE Sensitive     PIP/TAZO <=4 SENSITIVE Sensitive     * >=100,000 COLONIES/mL ESCHERICHIA COLI  Culture, blood (routine x 2)     Status: None   Collection Time: 10/22/20  7:26 AM   Specimen: BLOOD  Result Value Ref Range Status   Specimen Description   Final    BLOOD LEFT ANTECUBITAL Performed at [place] [address] [place], [place] [address]    Special Requests   Final    BOTTLES DRAWN AEROBIC ONLY Blood Culture results may not be optimal due to an inadequate volume of blood received in culture bottles Performed at [place] [address] [place], [place] [address]    Culture   Final    NO GROWTH 5 DAYS Performed at [facility], [place] [address], [place], [place] [address]    Report Status 10/27/2020 FINAL  Final  Culture, blood (routine x 2)     Status: None   Collection Time: 10/22/20  7:26 AM   Specimen: BLOOD  Result Value Ref Range Status   Specimen Description   Final    BLOOD LEFT ANTECUBITAL Performed at [place] [address], [place], [place] [address]    Special Requests   Final    BOTTLES DRAWN AEROBIC ONLY Blood Culture results may not be optimal due to an inadequate volume of blood received in culture bottles Performed at [place] [address], [place], [place] [address]    Culture   Final    NO GROWTH 5 DAYS Performed at [facility], [place] [address] [place], [place] [address]    Report Status 10/27/2020 FINAL  Final  SARS CORONAVIRUS 2 (TAT 6-24 HRS) Nasopharyngeal Nasopharyngeal Swab     Status:  None   Collection Time: 10/27/20  1:19 PM   Specimen: Nasopharyngeal Swab  Result Value Ref Range Status   SARS Coronavirus 2 NEGATIVE NEGATIVE Final    Comment: (NOTE) SARS-CoV-2 target nucleic acids are NOT DETECTED.  The SARS-CoV-2 RNA is generally detectable in upper and lower respiratory specimens during the acute phase of infection. Negative results do not preclude SARS-CoV-2 infection, do not rule out co-infections with other pathogens, and should not be used as the sole basis for treatment or other patient management decisions. Negative results must be combined with clinical observations, patient history, and epidemiological information. The expected result is Negative.  Fact Sheet for Patients: SugarRoll.be  Fact Sheet for Healthcare Providers: https://www.woods-mathews.com/  This test is not yet approved or cleared by the [place]  States FDA and  has been authorized for detection and/or diagnosis of SARS-CoV-2 by FDA under an Emergency Use Authorization (EUA). This EUA will remain  in effect (meaning this test can be used) for the duration of the COVID-19 declaration under Se ction 564(b)(1) of the Act, 21 U.S.C. section 360bbb-3(b)(1), unless the authorization is terminated or revoked sooner.  Performed at [facility], [place] [address], [place], [place] [address]   Resp Panel by RT-PCR (Flu A&B, Covid) Nasopharyngeal Swab     Status: None   Collection Time: 11/05/20  7:14 PM   Specimen: Nasopharyngeal Swab; Nasopharyngeal(NP) swabs in vial transport medium  Result Value Ref Range Status   SARS Coronavirus 2 by RT PCR NEGATIVE NEGATIVE Final    Comment: (NOTE) SARS-CoV-2 target nucleic acids are NOT DETECTED.  The SARS-CoV-2 RNA is generally detectable in upper respiratory specimens during the acute phase of infection. The lowest concentration of SARS-CoV-2 viral copies this assay can detect is 138 copies/mL. A negative  result does not preclude SARS-Cov-2 infection and should not be used as the sole basis for treatment or other patient management decisions. A negative result may occur with  improper specimen collection/handling, submission of specimen other than nasopharyngeal swab, presence of viral mutation(s) within the areas targeted by this assay, and inadequate number of viral copies(<138 copies/mL). A negative result must be combined with clinical observations, patient history, and epidemiological information. The expected result is Negative.  Fact Sheet for Patients:  EntrepreneurPulse.com.au  Fact Sheet for Healthcare Providers:  IncredibleEmployment.be  This test is no t yet approved or cleared by the [place] FDA and  has been authorized for detection and/or diagnosis of SARS-CoV-2 by FDA under an Emergency Use Authorization (EUA). This EUA will remain  in effect (meaning this test can be used) for the duration of the COVID-19 declaration under Section 564(b)(1) of the Act, 21 U.S.C.section 360bbb-3(b)(1), unless the authorization is terminated  or revoked sooner.       Influenza A by PCR NEGATIVE NEGATIVE Final   Influenza B by PCR NEGATIVE NEGATIVE Final    Comment: (NOTE) The Xpert Xpress SARS-CoV-2/FLU/RSV plus assay is intended as an aid in the diagnosis of influenza from Nasopharyngeal swab specimens and should not be used as a sole basis for treatment. Nasal washings and aspirates are unacceptable for Xpert Xpress SARS-CoV-2/FLU/RSV testing.  Fact Sheet for Patients: EntrepreneurPulse.com.au  Fact Sheet for Healthcare Providers: IncredibleEmployment.be  This test is not yet approved or cleared by the [place] FDA and has been authorized for detection and/or diagnosis of SARS-CoV-2 by FDA under an Emergency Use Authorization (EUA). This EUA will remain in effect (meaning this test can be used)  for the duration of the COVID-19 declaration under Section 564(b)(1) of the Act, 21 U.S.C. section 360bbb-3(b)(1), unless the authorization is terminated or revoked.  Performed at [facility], [place] [address], [place], [place] [address]   Blood Culture (routine x 2)     Status: None (Preliminary result)   Collection Time: 11/05/20  8:36 PM   Specimen: BLOOD  Result Value Ref Range Status   Specimen Description   Final    BLOOD BLOOD RIGHT FOREARM Performed at [place] [address], [place],  [address]    Special Requests   Final    BOTTLES DRAWN AEROBIC AND ANAEROBIC Blood Culture results may not be optimal due to an inadequate volume of blood received in culture bottles Performed at [facility], [place] [address], [place], [place] [address]    Culture   Final    NO GROWTH 1 DAY Performed at [facility], [place] [address] [place], [place] [address]    Report Status PENDING  Incomplete  Blood Culture (routine x 2)     Status: None (Preliminary result)   Collection Time: 11/05/20  8:38 PM   Specimen: BLOOD  Result Value Ref Range Status   Specimen Description   Final    BLOOD Performed at [place] [address] [place], [place] [address]    Special Requests   Final    BOTTLES DRAWN AEROBIC AND ANAEROBIC Blood Culture results may not be optimal due to an inadequate volume of blood received in culture bottles Performed at [place] [address] [place], [place] [address]    Culture   Final    NO GROWTH 1 DAY Performed at [facility], [place] [address] [place], [place] [address]    Report Status PENDING  Incomplete  Urine culture     Status: Abnormal (Preliminary result)   Collection Time: 11/05/20  8:38 PM   Specimen: In/Out Cath Urine  Result Value Ref Range Status   Specimen Description   Final    IN/OUT CATH URINE Performed at [place] [address], [place], [place] [address]    Special Requests   Final    NONE Performed at [facility], [place] [address] [place], [place] [address]    Culture (A)  Final    60,000 COLONIES/mL Lonell Grandchild NEGATIVE RODS SUSCEPTIBILITIES TO FOLLOW Performed at [facility], [place] [address], [place], [place] [address]    Report Status PENDING  Incomplete  MRSA PCR Screening     Status: None   Collection Time: 11/06/20  8:42 AM   Specimen: Nasopharyngeal  Result Value Ref Range Status   MRSA by PCR NEGATIVE NEGATIVE Final    Comment:        The GeneXpert MRSA Assay (FDA approved for NASAL specimens only), is one component of a comprehensive MRSA colonization surveillance program. It is not intended to diagnose MRSA infection nor to guide or monitor treatment for MRSA infections. Performed at [facility], [place] [address], [place], [place] [address]   Aerobic/Anaerobic Culture (surgical/deep wound)     Status: None (Preliminary result)   Collection Time: 11/06/20  3:45 PM   Specimen: Liver; Abscess  Result Value Ref Range Status   Specimen Description   Final    LIVER Performed at [place] [address], [place], [place] [address]    Special Requests   Final    NONE Performed at [facility], [place] [address], [place], [place] [address]    Gram Stain   Final    FEW GRAM VARIABLE ROD Performed at [facility], [place] [address] [place],  [address]    Culture PENDING  Incomplete   Report Status PENDING  Incomplete  Aerobic/Anaerobic Culture w Gram Stain (surgical/deep wound)     Status: None (Preliminary result)   Collection Time: 11/06/20  3:46 PM   Specimen: Liver; Abscess  Result Value Ref Range Status   Specimen Description LIVER ABSCESS  Final   Special Requests MORE INFERIOR RIGHT LIVER  Final   Gram Stain   Final    FEW WBC PRESENT, PREDOMINANTLY PMN FEW  GRAM NEGATIVE RODS RARE GRAM POSITIVE  COCCI IN PAIRS Performed at [facility], [place] [address] [place], [place] [address]    Culture PENDING  Incomplete   Report Status PENDING  Incomplete    Studies/Results: CT CHEST ABDOMEN PELVIS W CONTRAST  Result Date: 11/06/2020 CLINICAL DATA:  Pyelonephritis.  Complicated sepsis. EXAM: CT CHEST, ABDOMEN, AND PELVIS WITH CONTRAST TECHNIQUE: Multidetector CT imaging of the chest, abdomen and pelvis was performed following the standard protocol during bolus administration of intravenous contrast. CONTRAST:  144mL OMNIPAQUE IOHEXOL 300 MG/ML  SOLN COMPARISON:  Outside abdominal CT 10/14/2020 FINDINGS: CT CHEST FINDINGS Cardiovascular: Normal heart size. No pericardial effusion. Coronary atherosclerosis. Limited opacification of the pulmonary arteries. Mediastinum/Nodes: Negative for adenopathy or mass. Lungs/Pleura: Airspace opacity in the subpleural left lower lobe and patchy reticulonodular density in the bilateral lower lobes. No edema or effusion. No pneumothorax. Musculoskeletal: No acute finding.  Mild scoliosis CT ABDOMEN PELVIS FINDINGS Hepatobiliary: Sequela of Whipple procedure.There is soft tissue density at the liver hilum better demonstrated on prior pancreas protocol CT with arterial narrowings described on that study. Intrahepatic bile duct dilatation which is similar to previous. Left lobe of the liver is atrophic in the setting of chronic left portal vein occlusion and hepatic arterial narrowings. There is a cluster of new low-density areas in the posterior right lobe liver with the largest collection measuring 4.6 cm. Mild hypo perfusion in the adjacent parenchyma. Pancreas: Status post Whipple procedure. Atrophy of the remaining pancreas. Soft tissue density around the celiac branches as described on outside CT staging. No acute pancreatitis is seen. Spleen: Negative Adrenals/Urinary Tract: Negative adrenals. No hydronephrosis or stone.  Unremarkable bladder which is decompressed by a Foley catheter. Stomach/Bowel: No evidence of bowel obstruction. Sequela of local procedure. Rectal stool distention with mild mesorectal edema. The rectum measures up to 8 cm in diameter. Vascular/Lymphatic: Celiac branch narrowings and chronic left portal vein occlusion as described above. Atheromatous calcification. No discrete retroperitoneal nodes. Reproductive:No pathologic findings. Other: No ascites or pneumoperitoneum. Musculoskeletal: Intertrochanteric right femur fracture with recent repair. No acute osseous finding. Lumbar spine degeneration and scoliosis. Message was sent to the clinical team via secure chat in epic. IMPRESSION: 1. Cluster of bilomas/abscess in the posterior right lobe liver with the largest component measuring up to 4.6 cm. Bile duct dilatation is similar to recent staging scan 08/31/2020. 2. Whipple procedure for pancreas carcinoma with infiltrative soft tissue about the narrowed celiac branches and hepatic hilum, reference dedicated staging scan at [facility] 08/31/2020. 3. No CT findings to correlate with history of pyelonephritis. 4. Airspace disease in the bilateral lower lobes compatible with pneumonia or aspiration. Notable subpleural involvement in the left lower lobe is likely consolidation given the overall pattern but please have low threshold for lower extremity Doppler ultrasound given recent right femur fracture repair. 5. Moderate stool distended rectum with mesorectal edema. Electronically Signed   By: Monte Fantasia M.D.   On: 11/06/2020 06:29   DG CHEST PORT 1 VIEW  Result Date: 11/07/2020 CLINICAL DATA:  Shortness of breath EXAM: PORTABLE CHEST 1 VIEW COMPARISON:  November 05, 2020 chest radiograph; chest CT November 06, 2020 FINDINGS: There is airspace opacity in each lung base. Lungs elsewhere clear. Heart size and pulmonary vascularity normal. No adenopathy. There is lower thoracic dextroscoliosis. There is  postoperative change in the lower cervical spine. IMPRESSION: Patchy airspace opacity in the lung bases, similar to 1 day prior but increased compared to 2 days prior. Stable cardiac silhouette. No adenopathy evident. Electronically Signed  By: Lowella Grip III M.D.   On: 11/07/2020 07:58   DG Chest Port 1 View  Result Date: 11/05/2020 CLINICAL DATA:  Sepsis. EXAM: PORTABLE CHEST 1 VIEW COMPARISON:  Chest x-ray 10/18/2020 FINDINGS: The cardiac silhouette, mediastinal and hilar contours are within normal limits. The lungs are clear of an acute process. No pulmonary lesions or pleural effusion. The bony thorax is intact. IMPRESSION: No acute cardiopulmonary findings. Electronically Signed   By: Marijo Sanes M.D.   On: 11/05/2020 18:53      Assessment/Plan:  INTERVAL HISTORY: pt is sp IR guided aspirate of liver abscesses    Principal Problem:   Sepsis ([place]) Active Problems:   Essential hypertension   GERD without esophagitis   Mixed diabetic hyperlipidemia associated with type 2 diabetes mellitus ([place])   Adenocarcinoma of head of pancreas ([place])   Uncontrolled type 2 diabetes mellitus with ketoacidosis without coma, with long-term current use of insulin (HCC)   Increased anion gap metabolic acidosis   Chronic diastolic CHF (congestive heart failure) (HCC)   BPH with urinary obstruction   Pressure ulcer of sacral region, stage 2 (HCC)   Pneumonia of both lower lobes due to infectious organism   Hepatic abscess   Sepsis secondary to UTI (HCC)    Colton Bush is a 65 y.o. male with  Hx of pancreatic cancer recent fall with Strep anginoisis in 1/2 blood cultures sp surgery to hip fracture, readmitted with sepsis and found to have liver abscesses sp IR guided drain  #1 LIver abscesses and recent strep anginosus Cx from abscess GPC  --continue unasyn  Dr. Juleen China will take over the service tomorrow.   LOS: 1 day   Alcide Evener 11/07/2020, 11:25 AM

## 2020-11-07 NOTE — Progress Notes (Signed)
Patient exhibiting episodes of facial twitching and non voluntary movements of the face while transporting to MRI and CT. Each episode lasting 30 seconds each. Seizure precautions on.  TRH paged. RN will continue to monitor.

## 2020-11-07 NOTE — Evaluation (Signed)
Clinical/Bedside Swallow Evaluation Patient Details  Name: Colton Bush MRN: [id number] Date of Birth: 1956/01/06  Today's Date: 11/07/2020 Time: SLP Start Time (ACUTE ONLY): 1640 SLP Stop Time (ACUTE ONLY): 1700 SLP Time Calculation (min) (ACUTE ONLY): 20 min  Past Medical History:  Past Medical History:  Diagnosis Date  . Arthritis    left hand  . Bronchitis 1977  . Cancer ([place]) 03/09/2016   pancreatic cancer, sees Dr. Cristino Martes at [facility]   . Depression    takes Cymbalta daily  . Diabetes mellitus type II    sees Dr. Chalmers Cater   . GERD (gastroesophageal reflux disease)    takes Omeprazole daily  . H/O hiatal hernia   . Hyperlipidemia    takes Zocor daily  . Hypertension    takes Amlodipine daily  . Hypoglycemia 06/18/2017  . Neck pain    C4-7 stenosis and herniated disc  . Neuromuscular disorder ([place])    hiatal hernia  . Scoliosis    slight  . Spinal cord injury, C5-C7 ([place])    c4-c7  . Stiffness of hand joint    d/t cervical issues   Past Surgical History:  Past Surgical History:  Procedure Laterality Date  . ANTERIOR CERVICAL DECOMP/DISCECTOMY FUSION  08/18/2011   Procedure: ANTERIOR CERVICAL DECOMPRESSION/DISCECTOMY FUSION 3 LEVELS;  Surgeon: Winfield Cunas, MD;  Location: [place] [facility];  Service: Neurosurgery;  Laterality: N/A;  Anterior Cervical Four-Five/Five-Six/Six-Seven Decompression with Fusion, Plating, and Bonegraft  . CARPAL TUNNEL RELEASE  2013   bilateral, per Dr. Christella Noa   . COLONOSCOPY  10-30-14   per Dr. Olevia Perches, clear, repeat in 10 yrs   . egd with esophageal dilation  9-08   per Dr. Olevia Perches  . ERCP N/A 03/01/2016   Procedure: ENDOSCOPIC RETROGRADE CHOLANGIOPANCREATOGRAPHY (ERCP) with brushings and stent;  Surgeon: Doran Stabler, MD;  Location: [facility];  Service: Endoscopy;  Laterality: N/A;  . ESOPHAGOGASTRODUODENOSCOPY (EGD) WITH PROPOFOL N/A 05/27/2020   Procedure: ESOPHAGOGASTRODUODENOSCOPY (EGD) WITH PROPOFOL;  Surgeon: Milus Banister,  MD;  Location: [facility];  Service: Endoscopy;  Laterality: N/A;  . EUS N/A 03/09/2016   Procedure: ESOPHAGEAL ENDOSCOPIC ULTRASOUND (EUS) RADIAL;  Surgeon: Milus Banister, MD;  Location: [facility];  Service: Endoscopy;  Laterality: N/A;  . EUS N/A 05/27/2020   Procedure: UPPER ENDOSCOPIC ULTRASOUND (EUS) RADIAL;  Surgeon: Milus Banister, MD;  Location: [facility];  Service: Endoscopy;  Laterality: N/A;  . FEMUR IM NAIL Right 10/25/2020   Procedure: INTRAMEDULLARY (IM) NAIL FEMORAL;  Surgeon: Rod Can, MD;  Location: [facility];  Service: Orthopedics;  Laterality: Right;  . lymph nodes biopsy    . melanoma rt calf  1999  . PORT-A-CATH REMOVAL N/A 04/03/2019   Procedure: PORT REMOVAL;  Surgeon: Stark Klein, MD;  Location: [place];  Service: General;  Laterality: N/A;  . PORTACATH PLACEMENT Left 03/22/2016   Procedure: INSERTION PORT-A-CATH;  Surgeon: Stark Klein, MD;  Location: [facility];  Service: General;  Laterality: Left;  . SPINE SURGERY    . TONSILLECTOMY     as a child  . ULNAR TUNNEL RELEASE  2013   right arm, per Dr. Christella Noa   . UPPER GASTROINTESTINAL ENDOSCOPY    . WHIPPLE PROCEDURE N/A 09/19/2016   Procedure: DIAGNOSTIC LAPAROSCOPY, LAPAROSCOPIC LIVER BIOPSY, RETROPERITONEAL EXPLORATION, INTRAOPERATIVE ULTRASOUND;  Surgeon: Stark Klein, MD;  Location: [place];  Service: General;  Laterality: N/A;   HPI:  Patient is a 65 year old Caucasian male with a past medical history significant for  but not limited to pancreatic cancer diagnosed in 2017 with recent recurrence, diabetes mellitus type 2, hypertension, hyperlipidemia, chronic diastolic CHF with an EF of 55 to 60% and grade 1 diastolic dysfunction back in March 2022, recent right hip fracture status post repair and intramedullary nail as well as other comorbidities who presented to [facility] long ED with a chief complaint of a sudden change in mental status. In the ED is found to be septic and initial source was thought to be  another UTI but then further work-up revealed that he had a liver abscess.  2 drains placed in his liver abscess   Assessment / Plan / Recommendation Clinical Impression  Patient presents with what appears to be a primary cognitive based oropharyngeal dysphagia. Patient's affect was flat and all of his movements, oral motor, UE were slowed. He improved with his active participation in PO trials as session progressed. SLP started with ice chip trials and patient did start to exhbit some bilabial movements as well as swallow initiation but overall he did not exhibit any attempts at mastication. With cup sips of water, he did not exhibit adequate oral preparatory phase when SLP holding cup, but when he was directed to hold cup, he was able to then give himself sips, exhibiting good lip rounding on cup and only mild suspected swallow initiation delays. No coughing or throat clearing observed. Patient became a little more interactive, turning head to look at SLP, responding verbally "no" when asked if he wanted more. SLP is recommending NPO except cup sips of water PRN when patient is alert and when he is holding and managing cup. SLP plans to continue PO trials with patient next date. SLP Visit Diagnosis: Dysphagia, unspecified (R13.10)    Aspiration Risk  Mild aspiration risk;Moderate aspiration risk    Diet Recommendation NPO;Other (Comment) (cup sips water after oral care PRN when alert)   Liquid Administration via: Cup Supervision: Patient able to self feed;Staff to assist with self feeding Postural Changes: Seated upright at 90 degrees    Other  Recommendations Oral Care Recommendations: Oral care QID;Staff/trained caregiver to provide oral care   Follow up Recommendations Other (comment) (TBD)      Frequency and Duration min 2x/week  1 week       Prognosis Prognosis for Safe Diet Advancement: Good      Swallow Study   General Date of Onset: 11/05/20 HPI: Patient is a 65 year old  Caucasian male with a past medical history significant for but not limited to pancreatic cancer diagnosed in 2017 with recent recurrence, diabetes mellitus type 2, hypertension, hyperlipidemia, chronic diastolic CHF with an EF of 55 to 60% and grade 1 diastolic dysfunction back in March 2022, recent right hip fracture status post repair and intramedullary nail as well as other comorbidities who presented to [facility] long ED with a chief complaint of a sudden change in mental status. In the ED is found to be septic and initial source was thought to be another UTI but then further work-up revealed that he had a liver abscess.  2 drains placed in his liver abscess Type of Study: Bedside Swallow Evaluation Previous Swallow Assessment: None found Diet Prior to this Study: Regular;Thin liquids Temperature Spikes Noted: No Respiratory Status: Room air History of Recent Intubation: No Behavior/Cognition: Alert;Requires cueing;Cooperative Oral Cavity Assessment: Within Functional Limits Oral Care Completed by SLP: Yes Oral Cavity - Dentition: Missing dentition;Edentulous Self-Feeding Abilities: Needs assist;Needs set up Patient Positioning: Upright in bed Baseline Vocal Quality: Other (comment) (  very minimal vocalizations but did appear clear yet low vocal intensity) Volitional Cough: Cognitively unable to elicit Volitional Swallow: Unable to elicit    Oral/Motor/Sensory Function Overall Oral Motor/Sensory Function: Mild impairment Facial ROM: Reduced right;Reduced left Facial Symmetry: Within Functional Limits Facial Strength: Within Functional Limits Lingual ROM: Within Functional Limits Lingual Symmetry: Within Functional Limits Lingual Strength: Reduced   Ice Chips Ice chips: Impaired Oral Phase Impairments: Poor awareness of bolus;Reduced lingual movement/coordination   Thin Liquid Thin Liquid: Impaired Oral Phase Impairments: Poor awareness of bolus Pharyngeal  Phase Impairments: Suspected  delayed Swallow    Nectar Thick     Honey Thick     Puree Puree: Not tested   Solid     Solid: Not tested      Sonia Baller, MA, CCC-SLP Speech Therapy

## 2020-11-07 NOTE — Progress Notes (Addendum)
Called by radiology with MRI results. Left subdural hematoma noted. Unclear if acute or chronic in nature. CT of head w/o contrast order to clarify. Left holohemispheric subdural collection measuring 10 mm in thickness with mild mass effect on the left hemisphere and only trace midline shift. This is most consistent with a late subacute or chronic subdural hematoma. At 2203, RN called stating that pt had 3 episodes of facial twitching and involuntary movement of face while being transport to CT. Possible seizure-like activity. However, RN states that pt GCS is 13 and has been throughout the day, pupils are equal round reactive to light, he follows commands. VS remain stable.    Late subacute or chronic subdural hematoma - Advised RN to call if any additional seizure-like activities. - Ativan IV ordered. - Neurochecks q4hrs  - Possible neurosurgical consult in AM. Will escalate care if pt GCS decreases or seizure-like activity noted again.  - Lovenox on hold for now.   Lovey Newcomer, NP [facility] 7p-7a [phone]

## 2020-11-07 NOTE — Consult Note (Signed)
WOC Nurse Consult Note: Reason for Consult: DTPI (POA) to right heel and left buttock Wound type:pressure Pressure Injury POA: Yes Measurement: Left buttock with linear DTPI measuring 5cmn x 0.4xm and presenting as a dep purple/maroon skin discoloration Right foot with 1.5cm round DTPI presenting as a deep maroon/purple skin discoloration Wound bed:As described above Drainage (amount, consistency, odor) None Periwound: intact, dry Dressing procedure/placement/frequency: Bilateral pressure redistribution heel boots are provided.  Bilateral silicone foam dressings are already in place to feet and a sacral foam to the buttocks. Turning and repositioning is in place as is timely incontinence care with house skin care products.  [place] nursing team will not follow, but will remain available to this patient, the nursing and medical teams.  Please re-consult if needed. Thanks, Maudie Flakes, MSN, RN, [place], Arther Abbott  Pager# [phone]

## 2020-11-07 NOTE — Progress Notes (Signed)
PROGRESS NOTE    Colton Bush  JJO:[id number] DOB: September 24, 1955 DOA: 11/05/2020 PCP: Wenda Low, MD   Brief Narrative:  Patient is a 65 year old Caucasian male with a past medical history significant for but not limited to pancreatic cancer diagnosed in 2017 with recent recurrence, diabetes mellitus type 2, hypertension, hyperlipidemia, chronic diastolic CHF with an EF of 55 to 60% and grade 1 diastolic dysfunction back in March 2022, recent right hip fracture status post repair and intramedullary nail as well as other comorbidities who presented to [facility] ED with a chief complaint of a sudden change in mental status.  Patient was extremely lethargic and not able to provide a subjective history.  He recently had a hospitalization from March 21 until March 31 after a fall and suffering a right hip fracture.  Hospitalization at that time was complicated by urinary retention requiring Foley catheter placement.  Additionally he is thought to be suffering from an E. coli UTI so he was placed on antibiotics.  Finally the resident was also found to have 1 blood culture positive for strep angina gnosis and was initially treated with IV ceftriaxone.  He was evaluated by infectious disease and repeat blood cultures ended up with no growth and patient was cleared to proceed with a right hip repair.  He underwent fixation with intramedullary nail on 10/25/2020 and is eventually discharged to [facility] on 331.  He is slowly gaining his strength back at [place] the last day however had a sudden change in his mental status and begin developing lethargy 24 to 48 hours ago.  This rapidly progressed and patient became quite weak had a poor appetite and lethargic.  Over the span that time he also exhibited dry nonproductive cough.  The facility contacted EMS and patient was brought to [facility] for evaluation.  In the ED is found to be septic and initial source was thought to be  another UTI but then further work-up revealed that he had a liver abscess.  IR was consulted to have the drain placement in his liver abscess and ID was consulted for antibiotic management.  Of note patient was also found to be in DKA upon evaluation and remained on insulin drip until this evening when he is being transitioned.  Patient is doing a little today and not complaining of any pain.  He was still lethargic and somnolent but easily arousable and a little confused.  Infectious disease recommended continuing IV Unasyn for now as the culture from the abscess showed gram-positive cocci.  Interventional radiology recommending continue current drain management with every shift flushes and monitoring output and repeating CT scan when output is less than 10 cc a day for possible removal.  PT OT recommending SNF. SLP evaluating and recommending NPO except sips with water when more awake.   Assessment & Plan:   Principal Problem:   Sepsis ([place]) Active Problems:   Essential hypertension   GERD without esophagitis   Mixed diabetic hyperlipidemia associated with type 2 diabetes mellitus ([place])   Adenocarcinoma of head of pancreas ([place])   Uncontrolled type 2 diabetes mellitus with ketoacidosis without coma, with long-term current use of insulin (HCC)   Increased anion gap metabolic acidosis   Chronic diastolic CHF (congestive heart failure) (HCC)   BPH with urinary obstruction   Pressure ulcer of sacral region, stage 2 ([place])   Pneumonia of both lower lobes due to infectious organism   Hepatic abscess   Sepsis secondary to UTI ([place])  Severe sepsis secondary to strep anginosis likely bacteremia associated with liver abscess and bilateral lower lobe pneumonia present on admission and ? E Coli UTI poA  -Initially felt the patient was suffering from a persistent catheter associated UTI however CT imaging of the chest abdomen pelvis found the patient to have an abscess located in the posterior right lobe  of the liver. -Additionally he is also found to have bilateral airspace disease in the lower lobes suggestive of pneumonia -Recently he was having a strep anginosis which could have precipitated bacteremia and an pneumonia and liver abscess -He met SIRS criteria on admission being febrile, have a leukocytosis, source of infection as well being tachycardic and being altered -Blood cultures x2 were obtained and patient was given aggressive IV fluid hydration with isotonic fluids -Treated with broad-spectrum IV antibiotics with IV vancomycin, IV cefepime and IV Flagyl and this was narrowed to IV Unasyn by Infectious diseases -Will add Breathing Treatments with Xopenex 0.63 mg and Atrovent 0.5 mg BID -He continues to have some sepsis physiology as he is febrile of 101 overnight with tachycardia and tachypnea -Interventional radiology was consulted and patient ended up having 2 drains placed yesterday  -Patient was placed in the stepdown unit given his severe sepsis -SLP evaluated and recommending NPO except Sips with Water -Infectious diseases recommended following the cultures from aspirate of the liver abscesses and repeating echocardiogram to evaluate the patient's heart valve -Patient was given IV fluids aggressively with LR and this was changed to D5 LR given his DKA -Currently remains on D5 and LR at 125 mL's per hour and will reduce this to 75 MLS per hour now it is DKA is resolving and stop IVF soon  -Urinalysis done on admission showed a hazy appearance with greater than 500 glucose, moderate hemoglobin, 80 ketones, trace leukocytes, 30 protein, rare bacteria, 6-10 RBCs per high-power field, 0-5 squamous epithelial cells, greater than 50 WBCs the urine culture now showing 60,000 CFU of E Coli  -CRP was 19.4 -Procalcitonin level was 0.56 -> 0.59 -The WBC was significantly elevated at 31,000 and now trending down to 22,600 -We will continue monitor temperature curve -Patient is still somnolent  and drowsy but more awake today than he was yesterday so we will continue watch for clinical improvement  Acute Toxic Encephalopathy in the setting of Infection -Likely has a encephalopathy in the setting of sepsis -If he is not improving will obtain MRI and further imaging studies -May need to discuss with neurology if he persist to be encephalopathic despite adequate treatment and do further work-up  Diabetic Ketoacidosis without coma in the setting of infection as above Metabolic Acidosis -Overnight the patient has developed progressively worsening metabolic acidosis with anion gap and markedly elevated beta hydroxybutyrate with persisting substantial hyperglycemia all concerning for developing diabetic ketoacidosis -This had likely been precipitated by underlying sepsis -Patient's beta hydroxybutyrate acid went from 7.90 and trended all the way down to 0.33 -He was initiated on insulin infusion and transferred to stepdown and now that his gap is closed and his numbers have improved he was transitioned off of the insulin drip to long-acting insulin Levemir 20 units twice daily nitiating insulin infusion and transferring to stepdown unit for continued management -Performed serial chemistries and serial beta hydroxybutyrate levels and they were all improving -Hydrating patient aggressively with intravenous isotonic fluids and as above is now on D5 inLR at 75 mL's per hour -Patient to remain NPO given SLP recc's  -Metabolic acidosis is improved significantly  now CO2 is 21, anion gap is 9, chloride level is 107 -Continue monitor and trend repeat CMP in a.m. -CBG's now ranging from 125-165  Essential Hypertension -Holding home regimen of oral antipretenses due to tenuous oral intake initially but now he is more awake -C/w As needed intravenous antihypertensives is for markedly low blood pressure. -Currently holding his amlodipine and benazepril -Continue to Monitor BP per Protocol  -Last BP  was 116/55  Mixed diabetic hyperlipidemia associated with type 2 diabetes mellitus ([place]) -Continuing home regimen of statin therapy if patient is able to tolerate oral intake as well as his omega-3  Depression -Resume Cymbalta when able to tolerate p.o.  Adenocarcinoma of head of pancreas ([place]) -Diagnosed in 2017 -Status post chemotherapy and radiation at the time -Status post Whipple procedure -Now with recurrence of disease in recent months -Patient is expected to undergo palliative outpatient radiation therapy in the coming weeks. -May consult his oncologist while he is inpatient -Likely resume his Creon when tolerating po   Chronic Diastolic CHF (congestive heart failure) ([place]) -No evidence of volume overload at this time -Strict I's and O's and daily weights will need to continue monitor for signs and symptoms of volume overload given his aggressive fluid hydration -Patient is + 705 mL and will need a Repeat CXR in the AM   BPH with urinary obstruction -Foley catheter was placed during last hospitalization due to outlet obstruction and remains in place today. -Foley catheter has been exchanged during this hospitalization -Continuing home regimen of Tamsulosin 0.4 mg po Daily when tolerating po  Pressure ulcer of sacral region, stage 2 ([place]) -Wound care consultation placed -Present on admission  GERD without Esophagitis -Daily PPI IV pantoprazole 40 mg every 24 for now  Hypokalemia -Patient's potassium dropped to 3.2 in the setting of his insulin drip -Replete with IV K-Phos 20 mmol as well as IV KCl 30 mEq -Mag level was 1.7 so this will also be repeated  -Continue monitor and replete as necessary  -repeat CMP in a.m.  Hypophosphatemia -Patient's phosphorus level was 2.2 -Replete with IV K-Phos 20 mmol -Continue to monitor replete as necessary -Repeat phosphorus level in a.m.  Hip fracture in the setting of fall status post intramedullary nailing -Patient  is postoperative day 12 and this was done by Dr. Lyla Glassing on 10/17/2020 -We will need PT OT to evaluate and treat -Will start Enoxaparin 40 mg sq q24h until can take po safely to resume Apixaban  DVT prophylaxis: SCDs; will add DVT prophylaxis in the morning given his recent drain as his Eliquis 2.5 mg p.o. twice daily has been held and will add Enoxaparin 40 mg sq q24h Code Status: FULL CODE Family Communication: Discussed with the wife over the telephone Disposition Plan: Pending further clinical improvement and tolerance of diet and clearance by specialist  Status is: Inpatient  Remains inpatient appropriate because:Unsafe d/c plan, IV treatments appropriate due to intensity of illness or inability to take PO and Inpatient level of care appropriate due to severity of illness   Dispo: The patient is from: SNF              Anticipated d/c is to: SNF              Patient currently is not medically stable to d/c.   Difficult to place patient No  Consultants:   IR  Infectious Diseases    Procedures:  Procedure: Image guided drain placement, 2 x hepatic abscess.  2 x 86F pigtail  drain into right liver.  ECHOCARDIOGRAM Done and pending read  Antimicrobials:  Anti-infectives (From admission, onward)   Start     Dose/Rate Route Frequency Ordered Stop   11/07/20 2200  Ampicillin-Sulbactam (UNASYN) 3 g in sodium chloride 0.9 % 100 mL IVPB  Status:  Discontinued        3 g 200 mL/hr over 30 Minutes Intravenous Every 6 hours 11/06/20 2135 11/07/20 0943   11/07/20 1800  Ampicillin-Sulbactam (UNASYN) 3 g in sodium chloride 0.9 % 100 mL IVPB        3 g 200 mL/hr over 30 Minutes Intravenous Every 6 hours 11/07/20 1219     11/07/20 1600  Ampicillin-Sulbactam (UNASYN) 3 g in sodium chloride 0.9 % 100 mL IVPB  Status:  Discontinued        3 g 200 mL/hr over 30 Minutes Intravenous Every 6 hours 11/07/20 0943 11/07/20 1219   11/07/20 1030  Ampicillin-Sulbactam (UNASYN) 3 g in sodium chloride  0.9 % 100 mL IVPB        3 g 200 mL/hr over 30 Minutes Intravenous  Once 11/07/20 0943 11/07/20 1213   11/06/20 1800  Ampicillin-Sulbactam (UNASYN) 3 g in sodium chloride 0.9 % 100 mL IVPB  Status:  Discontinued        3 g 200 mL/hr over 30 Minutes Intravenous Every 6 hours 11/06/20 1442 11/06/20 2135   11/06/20 1100  cefTRIAXone (ROCEPHIN) 2 g in sodium chloride 0.9 % 100 mL IVPB  Status:  Discontinued        2 g 200 mL/hr over 30 Minutes Intravenous Every 24 hours 11/06/20 0855 11/06/20 1435   11/06/20 0800  vancomycin (VANCOREADY) IVPB 1250 mg/250 mL  Status:  Discontinued        1,250 mg 166.7 mL/hr over 90 Minutes Intravenous Every 12 hours 11/06/20 0643 11/06/20 0854   11/06/20 0700  metroNIDAZOLE (FLAGYL) IVPB 500 mg  Status:  Discontinued        500 mg 100 mL/hr over 60 Minutes Intravenous Every 8 hours 11/06/20 0638 11/06/20 1452   11/06/20 0400  ceFEPIme (MAXIPIME) 2 g in sodium chloride 0.9 % 100 mL IVPB  Status:  Discontinued        2 g 200 mL/hr over 30 Minutes Intravenous Every 8 hours 11/05/20 2358 11/06/20 0853   11/05/20 1845  vancomycin (VANCOREADY) IVPB 1500 mg/300 mL        1,500 mg 150 mL/hr over 120 Minutes Intravenous  Once 11/05/20 1830 11/05/20 2237   11/05/20 1830  ceFEPIme (MAXIPIME) 2 g in sodium chloride 0.9 % 100 mL IVPB        2 g 200 mL/hr over 30 Minutes Intravenous  Once 11/05/20 1824 11/05/20 2103   11/05/20 1830  metroNIDAZOLE (FLAGYL) IVPB 500 mg        500 mg 100 mL/hr over 60 Minutes Intravenous  Once 11/05/20 1824 11/05/20 2140   11/05/20 1830  vancomycin (VANCOCIN) IVPB 1000 mg/200 mL premix  Status:  Discontinued        1,000 mg 200 mL/hr over 60 Minutes Intravenous  Once 11/05/20 1824 11/05/20 1830       Subjective: Seen and examined at bedside this morning he was still lethargic and somnolent but little bit more awake and arousable.  Denied any pain.  No nausea or vomiting.  Continues to slur some of his words.  Wife states that he becomes  repetitive.  Denies any lightheadedness or dizziness.  Has 2 abdominal drains in place.  No  other concerns and SLP recommending n.p.o.  Objective: Vitals:   11/07/20 1400 11/07/20 1442 11/07/20 1500 11/07/20 1600  BP: (!) 100/57  (!) 109/53 (!) 116/55  Pulse: 73  79 70  Resp: 17  19 16   Temp:    98 F (36.7 C)  TempSrc:    Axillary  SpO2: 98% 98% 97% 97%  Weight:      Height:        Intake/Output Summary (Last 24 hours) at 11/07/2020 1628 Last data filed at 11/07/2020 1614 Gross per 24 hour  Intake 2352.29 ml  Output 2245 ml  Net 107.29 ml   Filed Weights   11/05/20 1748 11/06/20 0800 11/06/20 0903  Weight: 89 kg 79 kg 79 kg   Examination: Physical Exam:  Constitutional: Chronically ill-appearing Caucasian elderly male in NAD and appears calm but lethargic and somnolent Eyes: Lids and conjunctivae normal, sclerae anicteric  ENMT: External Ears, Nose appear normal. Grossly normal hearing.  Neck: Appears normal, supple, no cervical masses, normal ROM, no appreciable thyromegaly; no JVD Respiratory: Diminished to auscultation bilaterally with coarse breath sounds, no wheezing, rales, rhonchi or crackles. Normal respiratory effort and patient is not tachypenic. No accessory muscle use. Wearing Supplemental O2 Cardiovascular: Slightly tachycardic, no murmurs / rubs / gallops. S1 and S2 auscultated. No extremity edema. Abdomen: Soft, non-tender, non-distended.  No appreciable hepatosplenomegaly. Bowel sounds positive.  GU: Deferred. Musculoskeletal: No clubbing / cyanosis of digits/nails. No joint deformity upper and lower extremities.  Skin: No rashes, lesions, ulcers on a limited skin evaluation but Right Hip Incision appears C/D/I and has a mild wound on his left knee.. No induration; Warm and dry.  Neurologic: CN 2-12 grossly intact with no focal deficits. Romberg sign and cerebellar reflexes not assessed.  Psychiatric: Impaired judgment and insight. Somnolent and lethargic but  arousable. Normal mood and appropriate affect.   Data Reviewed: I have personally reviewed following labs and imaging studies  CBC: Recent Labs  Lab 11/05/20 1930 11/05/20 2007 11/06/20 0337 11/07/20 0124  WBC 21.3*  --  31.0* 22.6*  NEUTROABS 20.1*  --  29.4* 20.9*  HGB 12.5* 12.9* 11.0* 10.3*  HCT 39.1 38.0* 35.6* 31.7*  MCV 94.7  --  95.4 93.0  PLT 529*  --  635* 076*   Basic Metabolic Panel: Recent Labs  Lab 11/06/20 0337 11/06/20 0716 11/06/20 1123 11/06/20 1424 11/06/20 1820 11/06/20 2243 11/07/20 0124  NA 137   < > 140 139 136 136 137  K 4.4   < > 3.2* 3.0* 3.0* 3.4* 3.2*  CL 107   < > 111 110 108 108 107  CO2 10*   < > 21* 21* 20* 21* 21*  GLUCOSE 364*   < > 159* 139* 144* 153* 169*  BUN 20   < > 17 15 14 14 14   CREATININE 0.89   < > 0.62 0.57* 0.47* 0.48* 0.43*  CALCIUM 8.7*   < > 8.6* 8.3* 8.1* 8.1* 8.2*  MG 1.8  --   --  1.7  --   --  1.9  PHOS  --   --   --  2.4*  --   --  2.2*   < > = values in this interval not displayed.   GFR: Estimated Creatinine Clearance: 92.5 mL/min (A) (by C-G formula based on SCr of 0.43 mg/dL (L)). Liver Function Tests: Recent Labs  Lab 11/05/20 1930 11/06/20 0337 11/06/20 1424 11/07/20 0124  AST 20 18 29  45*  ALT 24 22 22  28  ALKPHOS 336* 303* 252* 230*  BILITOT 1.6* 1.9* 0.1* 0.8  PROT 8.2* 7.8 6.7 6.2*  ALBUMIN 2.8* 2.6* 2.2* 2.1*   No results for input(s): LIPASE, AMYLASE in the last 168 hours. Recent Labs  Lab 11/07/20 0851  AMMONIA 19   Coagulation Profile: Recent Labs  Lab 11/05/20 1930 11/06/20 0337  INR 1.5* 1.6*   Cardiac Enzymes: No results for input(s): CKTOTAL, CKMB, CKMBINDEX, TROPONINI in the last 168 hours. BNP (last 3 results) No results for input(s): PROBNP in the last 8760 hours. HbA1C: No results for input(s): HGBA1C in the last 72 hours. CBG: Recent Labs  Lab 11/07/20 0343 11/07/20 0745 11/07/20 0850 11/07/20 1145 11/07/20 1511  GLUCAP 160* 135* 161* 159* 125*   Lipid  Profile: No results for input(s): CHOL, HDL, LDLCALC, TRIG, CHOLHDL, LDLDIRECT in the last 72 hours. Thyroid Function Tests: No results for input(s): TSH, T4TOTAL, FREET4, T3FREE, THYROIDAB in the last 72 hours. Anemia Panel: No results for input(s): VITAMINB12, FOLATE, FERRITIN, TIBC, IRON, RETICCTPCT in the last 72 hours. Sepsis Labs: Recent Labs  Lab 11/05/20 1914 11/06/20 0337 11/07/20 0851  PROCALCITON  --  0.56  0.56 0.59  LATICACIDVEN 1.6  --   --     Recent Results (from the past 240 hour(s))  Resp Panel by RT-PCR (Flu A&B, Covid) Nasopharyngeal Swab     Status: None   Collection Time: 11/05/20  7:14 PM   Specimen: Nasopharyngeal Swab; Nasopharyngeal(NP) swabs in vial transport medium  Result Value Ref Range Status   SARS Coronavirus 2 by RT PCR NEGATIVE NEGATIVE Final    Comment: (NOTE) SARS-CoV-2 target nucleic acids are NOT DETECTED.  The SARS-CoV-2 RNA is generally detectable in upper respiratory specimens during the acute phase of infection. The lowest concentration of SARS-CoV-2 viral copies this assay can detect is 138 copies/mL. A negative result does not preclude SARS-Cov-2 infection and should not be used as the sole basis for treatment or other patient management decisions. A negative result may occur with  improper specimen collection/handling, submission of specimen other than nasopharyngeal swab, presence of viral mutation(s) within the areas targeted by this assay, and inadequate number of viral copies(<138 copies/mL). A negative result must be combined with clinical observations, patient history, and epidemiological information. The expected result is Negative.  Fact Sheet for Patients:  EntrepreneurPulse.com.au  Fact Sheet for Healthcare Providers:  IncredibleEmployment.be  This test is no t yet approved or cleared by the [place] FDA and  has been authorized for detection and/or diagnosis of SARS-CoV-2  by FDA under an Emergency Use Authorization (EUA). This EUA will remain  in effect (meaning this test can be used) for the duration of the COVID-19 declaration under Section 564(b)(1) of the Act, 21 U.S.C.section 360bbb-3(b)(1), unless the authorization is terminated  or revoked sooner.       Influenza A by PCR NEGATIVE NEGATIVE Final   Influenza B by PCR NEGATIVE NEGATIVE Final    Comment: (NOTE) The Xpert Xpress SARS-CoV-2/FLU/RSV plus assay is intended as an aid in the diagnosis of influenza from Nasopharyngeal swab specimens and should not be used as a sole basis for treatment. Nasal washings and aspirates are unacceptable for Xpert Xpress SARS-CoV-2/FLU/RSV testing.  Fact Sheet for Patients: EntrepreneurPulse.com.au  Fact Sheet for Healthcare Providers: IncredibleEmployment.be  This test is not yet approved or cleared by the [place] FDA and has been authorized for detection and/or diagnosis of SARS-CoV-2 by FDA under an Emergency Use Authorization (EUA). This EUA will remain  in effect (meaning this test can be used) for the duration of the COVID-19 declaration under Section 564(b)(1) of the Act, 21 U.S.C. section 360bbb-3(b)(1), unless the authorization is terminated or revoked.  Performed at [facility], [place] [address], [place], [place] [address]   Blood Culture (routine x 2)     Status: None (Preliminary result)   Collection Time: 11/05/20  8:36 PM   Specimen: BLOOD  Result Value Ref Range Status   Specimen Description   Final    BLOOD BLOOD RIGHT FOREARM Performed at [place] [address], [place], [place] [address]    Special Requests   Final    BOTTLES DRAWN AEROBIC AND ANAEROBIC Blood Culture results may not be optimal due to an inadequate volume of blood received in culture bottles Performed at [place] [address], [place], [place] [address]     Culture   Final    NO GROWTH 1 DAY Performed at [facility], [place] [address], [place], [place] [address]    Report Status PENDING  Incomplete  Blood Culture (routine x 2)     Status: None (Preliminary result)   Collection Time: 11/05/20  8:38 PM   Specimen: BLOOD  Result Value Ref Range Status   Specimen Description   Final    BLOOD Performed at [place] [address], [place], [place] [address]    Special Requests   Final    BOTTLES DRAWN AEROBIC AND ANAEROBIC Blood Culture results may not be optimal due to an inadequate volume of blood received in culture bottles Performed at [place] [address], [place], [place] [address]    Culture   Final    NO GROWTH 1 DAY Performed at [facility], [place] [address] [place], [place] [address]    Report Status PENDING  Incomplete  Urine culture     Status: Abnormal (Preliminary result)   Collection Time: 11/05/20  8:38 PM   Specimen: In/Out Cath Urine  Result Value Ref Range Status   Specimen Description   Final    IN/OUT CATH URINE Performed at [facility] [address], [place], [place] [address]    Special Requests   Final    NONE Performed at [facility], [place] [address] [place], [place] [address]    Culture (A)  Final    60,000 COLONIES/mL ESCHERICHIA COLI SUSCEPTIBILITIES TO FOLLOW Performed at [facility], [place] [address], [place], [place] [address]    Report Status PENDING  Incomplete  MRSA PCR Screening     Status: None   Collection Time: 11/06/20  8:42 AM   Specimen: Nasopharyngeal  Result Value Ref Range Status   MRSA by PCR NEGATIVE NEGATIVE Final    Comment:        The GeneXpert MRSA Assay (FDA approved for NASAL specimens only), is one component of a comprehensive MRSA colonization surveillance program. It is not intended to diagnose MRSA infection nor to guide or monitor treatment for MRSA  infections. Performed at [facility], [place] [address], [place], [place] [address]   Aerobic/Anaerobic Culture (surgical/deep wound)     Status: None (Preliminary result)   Collection Time: 11/06/20  3:45 PM   Specimen: Liver; Abscess  Result Value Ref Range Status   Specimen Description   Final    LIVER Performed at [place] [address] [place],  [place] [address]    Special Requests   Final    NONE Performed at [facility], [place] [address] [place], [place] [address]    Gram Stain FEW GRAM VARIABLE ROD  Final   Culture   Final    ABUNDANT KLEBSIELLA PNEUMONIAE CULTURE REINCUBATED FOR BETTER GROWTH Performed at [facility], [place] [address], [place], [place] [address]    Report Status PENDING  Incomplete  Aerobic/Anaerobic Culture w Gram Stain (surgical/deep wound)     Status: None (Preliminary result)   Collection Time: 11/06/20  3:46 PM   Specimen: Liver; Abscess  Result Value Ref Range Status   Specimen Description LIVER ABSCESS  Final   Special Requests MORE INFERIOR RIGHT LIVER  Final   Gram Stain   Final    FEW WBC PRESENT, PREDOMINANTLY PMN FEW GRAM NEGATIVE RODS RARE GRAM POSITIVE COCCI IN PAIRS    Culture   Final    TOO YOUNG TO READ Performed at [facility], [place] [address], [place], [place] [address]    Report Status PENDING  Incomplete     RN Pressure Injury Documentation: Pressure Injury 11/07/20 Heel Right Stage 1 -  Intact skin with non-blanchable redness of a localized area usually over a bony prominence. (Active)  11/07/20 0800  Location: Heel  Location Orientation: Right  Staging: Stage 1 -  Intact skin with non-blanchable redness of a localized area usually over a bony prominence.  Wound Description (Comments):   Present on Admission:      Estimated body mass index is 24.99 kg/m as calculated from the following:   Height as of this encounter: 5' 10"  (1.778 m).   Weight  as of this encounter: 79 kg.  Malnutrition Type:   Malnutrition Characteristics:   Nutrition Interventions:    Radiology Studies: CT CHEST ABDOMEN PELVIS W CONTRAST  Result Date: 11/06/2020 CLINICAL DATA:  Pyelonephritis.  Complicated sepsis. EXAM: CT CHEST, ABDOMEN, AND PELVIS WITH CONTRAST TECHNIQUE: Multidetector CT imaging of the chest, abdomen and pelvis was performed following the standard protocol during bolus administration of intravenous contrast. CONTRAST:  134m OMNIPAQUE IOHEXOL 300 MG/ML  SOLN COMPARISON:  Outside abdominal CT 10/14/2020 FINDINGS: CT CHEST FINDINGS Cardiovascular: Normal heart size. No pericardial effusion. Coronary atherosclerosis. Limited opacification of the pulmonary arteries. Mediastinum/Nodes: Negative for adenopathy or mass. Lungs/Pleura: Airspace opacity in the subpleural left lower lobe and patchy reticulonodular density in the bilateral lower lobes. No edema or effusion. No pneumothorax. Musculoskeletal: No acute finding.  Mild scoliosis CT ABDOMEN PELVIS FINDINGS Hepatobiliary: Sequela of Whipple procedure.There is soft tissue density at the liver hilum better demonstrated on prior pancreas protocol CT with arterial narrowings described on that study. Intrahepatic bile duct dilatation which is similar to previous. Left lobe of the liver is atrophic in the setting of chronic left portal vein occlusion and hepatic arterial narrowings. There is a cluster of new low-density areas in the posterior right lobe liver with the largest collection measuring 4.6 cm. Mild hypo perfusion in the adjacent parenchyma. Pancreas: Status post Whipple procedure. Atrophy of the remaining pancreas. Soft tissue density around the celiac branches as described on outside CT staging. No acute pancreatitis is seen. Spleen: Negative Adrenals/Urinary Tract: Negative adrenals. No hydronephrosis or stone. Unremarkable bladder which is decompressed by a Foley catheter. Stomach/Bowel: No evidence  of bowel obstruction. Sequela of local procedure. Rectal stool distention with mild mesorectal edema. The rectum measures up to 8 cm in diameter. Vascular/Lymphatic: Celiac branch narrowings and chronic  left portal vein occlusion as described above. Atheromatous calcification. No discrete retroperitoneal nodes. Reproductive:No pathologic findings. Other: No ascites or pneumoperitoneum. Musculoskeletal: Intertrochanteric right femur fracture with recent repair. No acute osseous finding. Lumbar spine degeneration and scoliosis. Message was sent to the clinical team via secure chat in epic. IMPRESSION: 1. Cluster of bilomas/abscess in the posterior right lobe liver with the largest component measuring up to 4.6 cm. Bile duct dilatation is similar to recent staging scan 08/31/2020. 2. Whipple procedure for pancreas carcinoma with infiltrative soft tissue about the narrowed celiac branches and hepatic hilum, reference dedicated staging scan at [facility] 08/31/2020. 3. No CT findings to correlate with history of pyelonephritis. 4. Airspace disease in the bilateral lower lobes compatible with pneumonia or aspiration. Notable subpleural involvement in the left lower lobe is likely consolidation given the overall pattern but please have low threshold for lower extremity Doppler ultrasound given recent right femur fracture repair. 5. Moderate stool distended rectum with mesorectal edema. Electronically Signed   By: Monte Fantasia M.D.   On: 11/06/2020 06:29   DG CHEST PORT 1 VIEW  Result Date: 11/07/2020 CLINICAL DATA:  Shortness of breath EXAM: PORTABLE CHEST 1 VIEW COMPARISON:  November 05, 2020 chest radiograph; chest CT November 06, 2020 FINDINGS: There is airspace opacity in each lung base. Lungs elsewhere clear. Heart size and pulmonary vascularity normal. No adenopathy. There is lower thoracic dextroscoliosis. There is postoperative change in the lower cervical spine. IMPRESSION: Patchy airspace opacity in the lung  bases, similar to 1 day prior but increased compared to 2 days prior. Stable cardiac silhouette. No adenopathy evident. Electronically Signed   By: Lowella Grip III M.D.   On: 11/07/2020 07:58   DG Chest Port 1 View  Result Date: 11/05/2020 CLINICAL DATA:  Sepsis. EXAM: PORTABLE CHEST 1 VIEW COMPARISON:  Chest x-ray 10/18/2020 FINDINGS: The cardiac silhouette, mediastinal and hilar contours are within normal limits. The lungs are clear of an acute process. No pulmonary lesions or pleural effusion. The bony thorax is intact. IMPRESSION: No acute cardiopulmonary findings. Electronically Signed   By: Marijo Sanes M.D.   On: 11/05/2020 18:53   Scheduled Meds: . Chlorhexidine Gluconate Cloth  6 each Topical Daily  . insulin aspart  0-9 Units Subcutaneous Q4H  . insulin detemir  20 Units Subcutaneous BID  . ipratropium  0.5 mg Nebulization BID  . levalbuterol  0.63 mg Nebulization BID  . lipase/protease/amylase  12,000 Units Oral TID with meals  . loratadine  10 mg Oral Daily  . mouth rinse  15 mL Mouth Rinse BID  . pantoprazole (PROTONIX) IV  40 mg Intravenous Q24H  . sodium chloride flush  5 mL Intracatheter Q8H  . tamsulosin  0.4 mg Oral Daily   Continuous Infusions: . ampicillin-sulbactam (UNASYN) IV    . dextrose 5% lactated ringers 75 mL/hr at 11/07/20 1614    LOS: 1 day   Kerney Elbe, DO [facility] PAGER is on [place]  If 7PM-7AM, please contact night-coverage [email or web address]

## 2020-11-07 NOTE — Progress Notes (Signed)
Referring Physician(s): Shalhoub, [place] [facility])  Supervising Physician: Ruthann Cancer  Patient Status:  [facility] - In-pt  Chief Complaint: Hepatic abscess drain x2 F/U  Subjective:  Hepatic abscesses s/p hepatic drain placement x2 in IR 11/06/2020. Patient laying in bed resting comfortably. Lethargic, opens eyes briefly to voice and quickly shuts them, mumbles a few words. Student RN at bedside. Hepatic abscess drain sites c/d/i.   Allergies: Patient has no known allergies.  Medications: Prior to Admission medications   Medication Sig Start Date End Date Taking? Authorizing Provider  acetaminophen (TYLENOL) 500 MG tablet Take 1,000 mg by mouth every 6 (six) hours as needed for moderate pain or headache.   Yes [provider]  amLODipine (NORVASC) 10 MG tablet Take 1 tablet (10 mg total) by mouth daily. 01/13/20  Yes [name], MD  apixaban (ELIQUIS) 2.5 MG TABS tablet Take 1 tablet (2.5 mg total) by mouth 2 (two) times daily. 10/27/20 11/26/20 Yes Cherlynn June B, PA  benazepril (LOTENSIN) 10 MG tablet TAKE 1 TABLET DAILY Patient taking differently: Take 10 mg by mouth daily. 10/04/20  Yes [name], MD  CREON 6000-19000 units CPEP Take 1 capsule by mouth with breakfast, with lunch, and with evening meal.  04/15/20  Yes [provider]  diphenhydrAMINE HCl, Sleep, 25 MG TBDP Take 25 mg by mouth at bedtime as needed (sleep).   Yes [provider]  ferrous sulfate 325 (65 FE) MG tablet Take 325 mg daily with breakfast by mouth.   Yes [provider]  Infant Care Products [facility]) OINT Apply 1 application topically 2 (two) times daily. T buttocks   Yes [provider]  insulin detemir (LEVEMIR) 100 UNIT/ML FlexPen Inject 25-35 Units into the skin See admin instructions. Inject 35 units in the morning and 25 units at night Patient taking differently: Inject 10-15 Units into the skin See admin instructions. Takes 10 units in the  morning and 15 units every evening 10/28/20  Yes [place] Donnamarie Poag, DO  loratadine (CLARITIN) 10 MG tablet Take 10 mg by mouth daily.   Yes [provider]  melatonin 3 MG TABS tablet Take 3 mg by mouth at bedtime.   Yes [provider]  Multiple Vitamin (MULTIVITAMIN WITH MINERALS) TABS tablet Take 1 tablet by mouth daily.   Yes [provider]  NOVOLOG FLEXPEN 100 UNIT/ML FlexPen GIVE EVERY MORNING WITH BREAKFAST AND EVERY EVENING WITH SUPPER PER SLIDING SCALE Patient taking differently: Inject 2-13 Units into the skin 2 (two) times daily. Sliding Scale : 101-150 - 2u 151-200 - 3u 201-250 - 5u 251-300 - 7u 301-350 - 9u 351- 400 - 11u 401-450 - 13u Greater than 150, alert MD 04/17/17  Yes [name], MD  Omega-3 Fatty Acids (FISH OIL) 500 MG CAPS Take 500 mg by mouth daily.   Yes [provider]  omeprazole (PRILOSEC) 20 MG capsule TAKE 1 CAPSULE DAILY Patient taking differently: Take 20 mg by mouth daily. 12/02/19  Yes [name], MD  oxyCODONE (OXY IR/ROXICODONE) 5 MG immediate release tablet Take 5 mg by mouth every 4 (four) hours as needed for severe pain or moderate pain.   Yes [provider]  potassium chloride SA (KLOR-CON M20) 20 MEQ tablet Take 1 tablet (20 mEq total) by mouth daily. 07/22/20  Yes [name], MD  rosuvastatin (CRESTOR) 20 MG tablet Take 1 tablet (20 mg total) by mouth daily. 07/22/20  Yes [name], MD  tamsulosin (FLOMAX) 0.4 MG CAPS capsule Take 1 capsule (0.4 mg total) by mouth daily. 10/29/20  Yes [place] Eric J, DO  B-D UF III MINI PEN NEEDLES 31G X 5 MM MISC Inject into the skin. 07/12/20   [provider]  docusate sodium (COLACE) 100 MG capsule Take 1 capsule (100 mg total) by mouth 2 (two) times daily. Patient not taking: Reported on 11/06/2020 10/28/20   [place] Donnamarie Poag, DO  FREESTYLE LITE test strip USE ONE STRIP TO CHECK GLUCOSE ONCE DAILY. PLEASE  SCHEDULE FOLLOW UP 11/09/16   [name], MD   Insulin Pen Needle 30G X 5 MM MISC Use one daily with insulin 03/04/16   Cop[place], Gay Filler, MD  Lancets (FREESTYLE) lancets USE AS INSTRUCTED TO CHECK BLOOD SUGAR ONCE A DAY 06/02/16   [name], MD     Vital Signs: BP (!) 112/50 (BP Location: Left Arm)   Pulse 78   Temp 97.7 F (36.5 C) (Axillary)   Resp 20   Ht 5\' 10"  (1.778 m)   Wt 174 lb 2.6 oz (79 kg)   SpO2 95%   BMI 24.99 kg/m   Physical Exam Vitals and nursing note reviewed.  Constitutional:      General: He is not in acute distress.    Comments: Lethargic.  Pulmonary:     Effort: Pulmonary effort is normal. No respiratory distress.  Abdominal:     Comments: Superior hepatic abscess drain (#1) site with dried blood on dressing, no erythema, drainage, or active bleeding; minimal output of bloody fluid in suction bulb. Inferior hepatic abscess drain (#2) site without erythema, drainage, or active bleeding; minimal output of bloody fluid in suction bulb.  Skin:    General: Skin is warm and dry.  Neurological:     Comments: Lethargic, opens eyes briefly to voice and quickly shuts them, mumbles a few words.     Imaging: CT CHEST ABDOMEN PELVIS W CONTRAST  Result Date: 11/06/2020 CLINICAL DATA:  Pyelonephritis.  Complicated sepsis. EXAM: CT CHEST, ABDOMEN, AND PELVIS WITH CONTRAST TECHNIQUE: Multidetector CT imaging of the chest, abdomen and pelvis was performed following the standard protocol during bolus administration of intravenous contrast. CONTRAST:  188mL OMNIPAQUE IOHEXOL 300 MG/ML  SOLN COMPARISON:  Outside abdominal CT 10/14/2020 FINDINGS: CT CHEST FINDINGS Cardiovascular: Normal heart size. No pericardial effusion. Coronary atherosclerosis. Limited opacification of the pulmonary arteries. Mediastinum/Nodes: Negative for adenopathy or mass. Lungs/Pleura: Airspace opacity in the subpleural left lower lobe and patchy reticulonodular density in the bilateral lower lobes. No edema or effusion. No pneumothorax.  Musculoskeletal: No acute finding.  Mild scoliosis CT ABDOMEN PELVIS FINDINGS Hepatobiliary: Sequela of Whipple procedure.There is soft tissue density at the liver hilum better demonstrated on prior pancreas protocol CT with arterial narrowings described on that study. Intrahepatic bile duct dilatation which is similar to previous. Left lobe of the liver is atrophic in the setting of chronic left portal vein occlusion and hepatic arterial narrowings. There is a cluster of new low-density areas in the posterior right lobe liver with the largest collection measuring 4.6 cm. Mild hypo perfusion in the adjacent parenchyma. Pancreas: Status post Whipple procedure. Atrophy of the remaining pancreas. Soft tissue density around the celiac branches as described on outside CT staging. No acute pancreatitis is seen. Spleen: Negative Adrenals/Urinary Tract: Negative adrenals. No hydronephrosis or stone. Unremarkable bladder which is decompressed by a Foley catheter. Stomach/Bowel: No evidence of bowel obstruction. Sequela of local procedure. Rectal stool distention with mild mesorectal edema. The rectum  measures up to 8 cm in diameter. Vascular/Lymphatic: Celiac branch narrowings and chronic left portal vein occlusion as described above. Atheromatous calcification. No discrete retroperitoneal nodes. Reproductive:No pathologic findings. Other: No ascites or pneumoperitoneum. Musculoskeletal: Intertrochanteric right femur fracture with recent repair. No acute osseous finding. Lumbar spine degeneration and scoliosis. Message was sent to the clinical team via secure chat in epic. IMPRESSION: 1. Cluster of bilomas/abscess in the posterior right lobe liver with the largest component measuring up to 4.6 cm. Bile duct dilatation is similar to recent staging scan 08/31/2020. 2. Whipple procedure for pancreas carcinoma with infiltrative soft tissue about the narrowed celiac branches and hepatic hilum, reference dedicated staging scan at  [facility] 08/31/2020. 3. No CT findings to correlate with history of pyelonephritis. 4. Airspace disease in the bilateral lower lobes compatible with pneumonia or aspiration. Notable subpleural involvement in the left lower lobe is likely consolidation given the overall pattern but please have low threshold for lower extremity Doppler ultrasound given recent right femur fracture repair. 5. Moderate stool distended rectum with mesorectal edema. Electronically Signed   By: Monte Fantasia M.D.   On: 11/06/2020 06:29   DG CHEST PORT 1 VIEW  Result Date: 11/07/2020 CLINICAL DATA:  Shortness of breath EXAM: PORTABLE CHEST 1 VIEW COMPARISON:  November 05, 2020 chest radiograph; chest CT November 06, 2020 FINDINGS: There is airspace opacity in each lung base. Lungs elsewhere clear. Heart size and pulmonary vascularity normal. No adenopathy. There is lower thoracic dextroscoliosis. There is postoperative change in the lower cervical spine. IMPRESSION: Patchy airspace opacity in the lung bases, similar to 1 day prior but increased compared to 2 days prior. Stable cardiac silhouette. No adenopathy evident. Electronically Signed   By: Lowella Grip III M.D.   On: 11/07/2020 07:58   DG Chest Port 1 View  Result Date: 11/05/2020 CLINICAL DATA:  Sepsis. EXAM: PORTABLE CHEST 1 VIEW COMPARISON:  Chest x-ray 10/18/2020 FINDINGS: The cardiac silhouette, mediastinal and hilar contours are within normal limits. The lungs are clear of an acute process. No pulmonary lesions or pleural effusion. The bony thorax is intact. IMPRESSION: No acute cardiopulmonary findings. Electronically Signed   By: Marijo Sanes M.D.   On: 11/05/2020 18:53    Labs:  CBC: Recent Labs    10/27/20 0327 11/05/20 1930 11/05/20 2007 11/06/20 0337 11/07/20 0124  WBC 15.3* 21.3*  --  31.0* 22.6*  HGB 10.6* 12.5* 12.9* 11.0* 10.3*  HCT 32.5* 39.1 38.0* 35.6* 31.7*  PLT 541* 529*  --  635* 470*    COAGS: Recent Labs    10/19/20 0259  10/24/20 0814 11/05/20 1930 11/06/20 0337  INR 1.4* 1.2 1.5* 1.6*  APTT  --   --  32 36    BMP: Recent Labs    04/14/20 0925 07/09/20 0825 11/06/20 1424 11/06/20 1820 11/06/20 2243 11/07/20 0124  NA 141   < > 139 136 136 137  K 3.3*   < > 3.0* 3.0* 3.4* 3.2*  CL 107   < > 110 108 108 107  CO2 27   < > 21* 20* 21* 21*  GLUCOSE 73   < > 139* 144* 153* 169*  BUN 9   < > 15 14 14 14   CALCIUM 8.8*   < > 8.3* 8.1* 8.1* 8.2*  CREATININE 0.71   < > 0.57* 0.47* 0.48* 0.43*  GFRNONAA >60   < > >60 >60 >60 >60  GFRAA >60  --   --   --   --   --    < > =  values in this interval not displayed.    LIVER FUNCTION TESTS: Recent Labs    11/05/20 1930 11/06/20 0337 11/06/20 1424 11/07/20 0124  BILITOT 1.6* 1.9* 0.1* 0.8  AST 20 18 29  45*  ALT 24 22 22 28   ALKPHOS 336* 303* 252* 230*  PROT 8.2* 7.8 6.7 6.2*  ALBUMIN 2.8* 2.6* 2.2* 2.1*    Assessment and Plan:  Hepatic abscesses s/p hepatic drain placement x2 in IR 11/06/2020. Superior hepatic abscess drain (#1) stable with minimal output of bloody fluid in suction bulb (additional 23 cc output from drain in past 24 hours per chart). Inferior hepatic abscess drain (#2) stable with minimal output of bloody fluid in suction bulb (additional 37 cc output from drain in past 24 hours per chart). Continue current drain management- continue with Qshift flushes/monitor of output. Plan for repeat CT when output <10 cc/day (assess for possible removal). Further plans per [facility]- appreciate and agree with management. IR to follow.   Electronically Signed: Earley Abide, PA-C 11/07/2020, 12:22 PM   I spent a total of 15 Minutes at the the patient's bedside AND on the patient's hospital floor or unit, greater than 50% of which was counseling/coordinating care for hepatic abscesses s/p hepatic drain placement x2.

## 2020-11-07 NOTE — Evaluation (Signed)
Physical Therapy Evaluation Patient Details Name: Colton Bush MRN: [id number] DOB: 1955-09-07 Today's Date: 11/07/2020   History of Present Illness  Patient is a 65 year old Caucasian male with a past medical history significant for but not limited to pancreatic cancer diagnosed in 2017 with recent recurrence, diabetes mellitus type 2, hypertension, hyperlipidemia, chronic diastolic CHF with an EF of 55 to 60% and grade 1 diastolic dysfunction back in March 2022, recent right hip fracture status post repair and intramedullary nail as well as other comorbidities who presented to [facility] ED with a chief complaint of a sudden change in mental status. In the ED is found to be septic and initial source was thought to be another UTI but then further work-up revealed that he had a liver abscess.  2 drains placed in his liver abscess  Clinical Impression  Pt admitted as above and presenting with functional mobility limitations 2* generalized weakness, poor endurance,  L hip and abdominal pain, balance deficits and decreased BP.  Pt very cooperative this date but requiring significant assist of two for performance of all basic mobility tasks.  Pt would benefit from continued SNF level rehab to maximize IND and safety.    Follow Up Recommendations SNF    Equipment Recommendations  None recommended by PT    Recommendations for Other Services       Precautions / Restrictions Precautions Precautions: Fall Precaution Comments: TWO hepatic drains on R Restrictions Weight Bearing Restrictions: No RLE Weight Bearing: Weight bearing as tolerated Other Position/Activity Restrictions: WBAT      Mobility  Bed Mobility Overal bed mobility: Needs Assistance Bed Mobility: Supine to Sit     Supine to sit: Mod assist;HOB elevated;+2 for safety/equipment;+2 for physical assistance Sit to supine: Mod assist;+2 for physical assistance;+2 for safety/equipment   General bed mobility comments: Assist to  manage LEs, control trunk and complete rotation to/from EOB utilizing bed pad.    Transfers                 General transfer comment: Pt declines to attempt 2* fatigue "that would just be too much today"  Ambulation/Gait                Stairs            Wheelchair Mobility    Modified Rankin (Stroke Patients Only)       Balance Overall balance assessment: History of Falls;Needs assistance Sitting-balance support: No upper extremity supported;Feet supported Sitting balance-[name]: Fair Sitting balance - Comments: reliant on at least unilateral UE support                                     Pertinent Vitals/Pain Pain Assessment: No/denies pain Faces Pain Scale: Hurts little more Pain Location: R hip with bed mobility Pain Descriptors / Indicators: Grimacing Pain Intervention(s): Monitored during session;Limited activity within patient's tolerance    Home Living Family/patient expects to be discharged to:: Skilled nursing facility                      Prior Function Level of Independence: Needs assistance   Gait / Transfers Assistance Needed: limited mobility with RW  ADL's / Homemaking Assistance Needed: needs assistance  Comments: Independent prior to R hip fx last month.  Pt reports ambulating "some" at SNF     Hand Dominance   Dominant Hand: Right  Extremity/Trunk Assessment   Upper Extremity Assessment Upper Extremity Assessment: Generalized weakness    Lower Extremity Assessment Lower Extremity Assessment: Generalized weakness RLE Deficits / Details: anticipated post op hip weakness with R LE weaker vs L    Cervical / Trunk Assessment Cervical / Trunk Assessment: Kyphotic  Communication   Communication: Expressive difficulties  Cognition Arousal/Alertness: Awake/alert Behavior During Therapy: Flat affect Overall Cognitive Status: Within Functional Limits for tasks assessed                                  General Comments: Able to follow commands and answer questions. Details limited. At times difficult to understand.      General Comments General comments (skin integrity, edema, etc.): BP  with move to siting 93/41    Exercises     Assessment/Plan    PT Assessment Patient needs continued PT services  PT Problem List Decreased strength;Decreased balance;Decreased mobility;Decreased activity tolerance;Decreased knowledge of use of DME;Pain       PT Treatment Interventions DME instruction;Therapeutic exercise;Gait training;Functional mobility training;Therapeutic activities;Patient/family education;Balance training    PT Goals (Current goals can be found in the Care Plan section)  Acute Rehab PT Goals Patient Stated Goal: Did not state PT Goal Formulation: With patient Time For Goal Achievement: 11/21/20 Potential to Achieve Goals: Fair    Frequency Min 3X/week   Barriers to discharge        Co-evaluation   Reason for Co-Treatment: For patient/therapist safety;To address functional/ADL transfers (co eval)           AM-PAC PT "6 Clicks" Mobility  Outcome Measure Help needed turning from your back to your side while in a flat bed without using bedrails?: A Little Help needed moving from lying on your back to sitting on the side of a flat bed without using bedrails?: A Lot Help needed moving to and from a bed to a chair (including a wheelchair)?: A Lot Help needed standing up from a chair using your arms (e.g., wheelchair or bedside chair)?: A Lot Help needed to walk in hospital room?: Total Help needed climbing 3-5 steps with a railing? : Total 6 Click Score: 11    End of Session   Activity Tolerance: Patient limited by fatigue Patient left: in bed;with bed alarm set;with call bell/phone within reach Nurse Communication: Mobility status PT Visit Diagnosis: Other abnormalities of gait and mobility (R26.89)    Time: 1594-5859 PT Time  Calculation (min) (ACUTE ONLY): 14 min   Charges:   PT Evaluation $PT Eval Moderate Complexity: [place] Pager [phone] Office [phone]   Alcus Bradly 11/07/2020, 3:57 PM

## 2020-11-07 NOTE — Progress Notes (Signed)
Took patient to CT in ED with transporter. Both as well observed abnormal facial and head tremors suggestive of seizure activity that lasted approximately 30 seconds. This occurred again for same duration when getting patient back into hospital room 1224. During both, patient unresponsive until over. Primary RN informed to alert physicians to abnormal tremor activity.

## 2020-11-08 ENCOUNTER — Inpatient Hospital Stay (HOSPITAL_COMMUNITY): Payer: Medicare Other

## 2020-11-08 ENCOUNTER — Ambulatory Visit: Payer: Medicare Other | Admitting: Radiation Oncology

## 2020-11-08 ENCOUNTER — Inpatient Hospital Stay (HOSPITAL_COMMUNITY)
Admit: 2020-11-08 | Discharge: 2020-11-08 | Disposition: A | Discharge status: Home / Self Care | Payer: Medicare Other | Attending: Internal Medicine | Admitting: Internal Medicine

## 2020-11-08 DIAGNOSIS — E876 Hypokalemia: Secondary | ICD-10-CM | POA: Diagnosis not present

## 2020-11-08 DIAGNOSIS — S066X0A Traumatic subarachnoid hemorrhage without loss of consciousness, initial encounter: Secondary | ICD-10-CM

## 2020-11-08 DIAGNOSIS — R569 Unspecified convulsions: Secondary | ICD-10-CM

## 2020-11-08 DIAGNOSIS — K750 Abscess of liver: Secondary | ICD-10-CM | POA: Diagnosis not present

## 2020-11-08 DIAGNOSIS — C250 Malignant neoplasm of head of pancreas: Secondary | ICD-10-CM | POA: Diagnosis not present

## 2020-11-08 DIAGNOSIS — E1110 Type 2 diabetes mellitus with ketoacidosis without coma: Secondary | ICD-10-CM | POA: Diagnosis not present

## 2020-11-08 LAB — GLUCOSE, CAPILLARY
Glucose-Capillary: 75 mg/dL (ref 70–99)
Glucose-Capillary: 92 mg/dL (ref 70–99)
Glucose-Capillary: 86 mg/dL (ref 70–99)
Glucose-Capillary: 125 mg/dL — ABNORMAL HIGH (ref 70–99)
Glucose-Capillary: 186 mg/dL — ABNORMAL HIGH (ref 70–99)
Glucose-Capillary: 228 mg/dL — ABNORMAL HIGH (ref 70–99)
Glucose-Capillary: 202 mg/dL — ABNORMAL HIGH (ref 70–99)

## 2020-11-08 LAB — PHOSPHORUS: Phosphorus: 3 mg/dL (ref 2.5–4.6)

## 2020-11-08 LAB — ECHOCARDIOGRAM LIMITED
Height: 70 in
Weight: 2786.61 oz

## 2020-11-08 LAB — CBC WITH DIFFERENTIAL/PLATELET
Abs Immature Granulocytes: 0.03 10*3/uL (ref 0.00–0.07)
Basophils Absolute: 0 10*3/uL (ref 0.0–0.1)
Basophils Relative: 0 %
Eosinophils Absolute: 0.1 10*3/uL (ref 0.0–0.5)
Eosinophils Relative: 1 %
HCT: 31.1 % — ABNORMAL LOW (ref 39.0–52.0)
Hemoglobin: 9.9 g/dL — ABNORMAL LOW (ref 13.0–17.0)
Immature Granulocytes: 0 %
Lymphocytes Relative: 9 %
Lymphs Abs: 1 10*3/uL (ref 0.7–4.0)
MCH: 29.7 pg (ref 26.0–34.0)
MCHC: 31.8 g/dL (ref 30.0–36.0)
MCV: 93.4 fL (ref 80.0–100.0)
Monocytes Absolute: 0.7 10*3/uL (ref 0.1–1.0)
Monocytes Relative: 6 %
Neutro Abs: 9.9 10*3/uL — ABNORMAL HIGH (ref 1.7–7.7)
Neutrophils Relative %: 84 %
Platelets: 410 10*3/uL — ABNORMAL HIGH (ref 150–400)
RBC: 3.33 MIL/uL — ABNORMAL LOW (ref 4.22–5.81)
RDW: 14.6 % (ref 11.5–15.5)
WBC: 11.7 10*3/uL — ABNORMAL HIGH (ref 4.0–10.5)
nRBC: 0 % (ref 0.0–0.2)

## 2020-11-08 LAB — PROCALCITONIN: Procalcitonin: 0.27 ng/mL

## 2020-11-08 LAB — COMPREHENSIVE METABOLIC PANEL
ALT: 40 U/L (ref 0–44)
AST: 50 U/L — ABNORMAL HIGH (ref 15–41)
Albumin: 2.1 g/dL — ABNORMAL LOW (ref 3.5–5.0)
Alkaline Phosphatase: 258 U/L — ABNORMAL HIGH (ref 38–126)
Anion gap: 8 (ref 5–15)
BUN: 10 mg/dL (ref 8–23)
CO2: 24 mmol/L (ref 22–32)
Calcium: 7.8 mg/dL — ABNORMAL LOW (ref 8.9–10.3)
Chloride: 104 mmol/L (ref 98–111)
Creatinine, Ser: 0.45 mg/dL — ABNORMAL LOW (ref 0.61–1.24)
GFR, Estimated: >60 mL/min (ref >60)
Glucose, Bld: 79 mg/dL (ref 70–99)
Potassium: 2.7 mmol/L — CL (ref 3.5–5.1)
Sodium: 136 mmol/L (ref 135–145)
Total Bilirubin: 0.7 mg/dL (ref 0.3–1.2)
Total Protein: 6.2 g/dL — ABNORMAL LOW (ref 6.5–8.1)

## 2020-11-08 LAB — URINE CULTURE: Culture: 60000 — AB

## 2020-11-08 LAB — MAGNESIUM: Magnesium: 1.7 mg/dL (ref 1.7–2.4)

## 2020-11-08 MED ORDER — SODIUM CHLORIDE 0.9 % IV SOLN
2000.0000 mg | Freq: Once | INTRAVENOUS | Status: AC
  Administered 2020-11-08: 2000 mg via INTRAVENOUS
  Filled 2020-11-08: qty 20

## 2020-11-08 MED ORDER — MAGNESIUM SULFATE 2 GM/50ML IV SOLN
2.0000 g | Freq: Once | INTRAVENOUS | Status: AC
  Administered 2020-11-08: 2 g via INTRAVENOUS
  Filled 2020-11-08: qty 50

## 2020-11-08 MED ORDER — POTASSIUM CHLORIDE 10 MEQ/100ML IV SOLN
10.0000 meq | INTRAVENOUS | Status: AC
Start: 2020-11-08 — End: 2020-11-08
  Administered 2020-11-08 (×6): 10 meq via INTRAVENOUS
  Filled 2020-11-08 (×6): qty 100

## 2020-11-08 MED ORDER — LEVETIRACETAM IN NACL 1000 MG/100ML IV SOLN
1000.0000 mg | Freq: Two times a day (BID) | INTRAVENOUS | Status: DC
  Administered 2020-11-08 – 2020-11-11 (×7): 1000 mg via INTRAVENOUS
  Filled 2020-11-08 (×9): qty 100

## 2020-11-08 NOTE — Consult Note (Addendum)
Neurology Consultation Reason for Consult: Seizure Referring Physician: Alfredia Ferguson, Jenetta Downer  CC: Seizure  History is obtained from: Wife  HPI: Colton Bush is a 65 y.o. male with no previous history of seizures who presented on 4/8 with increasing lethargy.  He was hospitalized from the 21st until 31st after a fall with a right hip fracture.  His wife does not know of any other falls or head injuries.  On admission he was septic and was found to have a hepatic abscess and pneumonia with bacteremia.  Due to his persistent altered mental status and MRI was obtained which demonstrates a moderate sized subdural hematoma which is felt to be chronic.  Neurosurgery was contacted who have not favored aggressive emergent intervention.  Last night he was having to have two episodes of seizure-like activity and was loaded with Keppra with cessation of seizure-like activity.  ROS: Unable to obtain due to altered mental status.   Past Medical History:  Diagnosis Date  . Arthritis    left hand  . Bronchitis 1977  . Cancer ([place]) 03/09/2016   pancreatic cancer, sees Dr. Cristino Martes at [facility]   . Depression    takes Cymbalta daily  . Diabetes mellitus type II    sees Dr. Chalmers Cater   . GERD (gastroesophageal reflux disease)    takes Omeprazole daily  . H/O hiatal hernia   . Hyperlipidemia    takes Zocor daily  . Hypertension    takes Amlodipine daily  . Hypoglycemia 06/18/2017  . Neck pain    C4-7 stenosis and herniated disc  . Neuromuscular disorder ([place])    hiatal hernia  . Scoliosis    slight  . Spinal cord injury, C5-C7 ([place])    c4-c7  . Stiffness of hand joint    d/t cervical issues     Family History  Problem Relation Age of Onset  . Heart disease Father   . Heart disease Brother 66  . Anesthesia problems Mother   . Heart disease Mother   . Dementia Mother   . Diabetes Sister   . Stroke Sister   . Colon cancer Neg Hx   . Rectal cancer Neg Hx   . Stomach cancer Neg Hx      Social  History:  reports that he has never smoked. He has never used smokeless tobacco. He reports that he does not drink alcohol and does not use drugs.   Exam: Current vital signs: BP (!) 109/52   Pulse 90   Temp 98.6 F (37 C) (Oral)   Resp (!) 24   Ht 5\' 10"  (1.778 m)   Wt 79 kg   SpO2 95%   BMI 24.99 kg/m  Vital signs in last 24 hours: Temp:  [97.8 F (36.6 C)-99.3 F (37.4 C)] 98.6 F (37 C) (04/11 1200) Pulse Rate:  [59-90] 90 (04/11 1400) Resp:  [15-24] 24 (04/11 1400) BP: (97-129)/(46-61) 109/52 (04/11 1400) SpO2:  [93 %-100 %] 95 % (04/11 1400)   Physical Exam  Constitutional: Appears well-developed and well-nourished.  Psych: Affect appropriate to situation Eyes: No scleral injection HENT: No OP obstruction MSK: no joint deformities.  Cardiovascular: Normal rate and regular rhythm.  Respiratory: Effort normal, non-labored breathing GI: Soft.  No distension. There is no tenderness.  Skin: WDI  Neuro: Mental Status: Patient is awake, alert, significant aphasia with expressive and receptive components. When asked to show two fingers, he shows his entire hand. When trying to speak, he has significnat sdifficulty with initiating sounds,  and often those are incorrect. He does count fingers and tell me his name.  Cranial Nerves: II: Visual Fields are full. R pupil larger than left, both are reactive.  III,IV, VI: EOMI without ptosis or diploplia.  V: Facial sensation is symmetric to temperature VII: Facial movement is symmetric.  VIII: hearing is intact to voice X: Uvula elevates symmetrically XI: Shoulder shrug is symmetric. XII: tongue is midline without atrophy or fasciculations.  Motor: Tone is normal. Bulk is normal. 5/5 strength was present on the left, he has 4+/5 weakness of the right amr and leg.  Sensory: Sensation is symmetric to light touch and temperature in the arms and legs. Cerebellar: No clear ataxia.    I have reviewed labs in epic and the  results pertinent to this consultation are: INR 1.6 PTT 36 Magnesium 1.9 Sodium 136 Creatinine 0.45 Calcium 7.8, corrects to 9.3  I have reviewed the images obtained: CT head-subacute to chronic appearing hematoma on the left  Impression: 65 year old male with a history of pancreatic adenocarcinoma with recurrence, recent fall and admission for sepsis, now with seizures.  His altered mental status I suspect is actually in large part aphasia.  He does have a subdural fluid collection, most consistent with subdural hematoma, and this certainly could be a source for seizures.  It is unclear when this occurred as he did not have any clear evidence of head trauma in the past, possibly with his fall previously  I am concerned with his persistent waxing/waning confusion that he may be having intermittent nonconvulsive seizures and therefore recommend continuous EEG monitoring  Recommendations: 1) transfer to [facility] for continuous EEG monitoring 2) continue Keppra 1000 mg twice daily 3) neurology will continue to follow  Roland Rack, MD [facility] [phone]  If 7pm- 7am, please page neurology on call as listed in [place].

## 2020-11-08 NOTE — Procedures (Signed)
  PROCEDURE: EEG video routine 23 minutesstudy  DATES OF TEST: 11/08/20   REASON FOR TEST: Seizure  AED/Sedative MEDICATIONS: Keppra  TECHNIQUE: This is an 18 channel digital EEG recording using the standard international 10/20 system of electrode placement with one channel EKG recording. Pt was awake and drowsy during this recording.  FINDINGS:  Background rhythm:symmetric 8.5-12 Hz  With intermittent asymmetric left 3-5 Hz Amplitude : normal Breach effect:  NO Variability: NO Reactivity: NO Rhythmic delta activity:NO Periodic discharges: NO Sporadic epileptiform discharges:NO Electrographic/electroclinical seizures:NO  Limited EKG reveals no abnormalities.    IMPRESSION:  This is an abnormal study due to - 1. L Focal slowing is consistent with a focal disturbance of cerebral function and an underlying structural lesion should be considered. 2. No definite epileptiform discharges or electrographic seizures noted.

## 2020-11-08 NOTE — Progress Notes (Signed)
PHARMACY NOTE -  Unasyn  Pharmacy has been assisting with dosing of Unasyn for S. anginosus liver abscesses.  Dosage remains stable at 3g IV q6 hr and further renal adjustments per institutional Pharmacy antibiotic protocol  Pharmacy will sign off, following peripherally for culture results or dose adjustments. Please reconsult if a change in clinical status warrants re-evaluation of dosage.  Reuel Boom, PharmD, BCPS [phone] 11/08/2020, 7:31 AM

## 2020-11-08 NOTE — Progress Notes (Signed)
  Speech Language Pathology Treatment: Dysphagia  Patient Details Name: Colton Bush MRN: [id number] DOB: [birth year] Today's Date: 11/08/2020 Time: 0840-0900 SLP Time Calculation (min) (ACUTE ONLY): 20 min  Assessment / Plan / Recommendation Clinical Impression  Patient seen with wife present in room to address dysphagia goals. Patient observed with cup sips of thin liquids (cola) which he was able to perform but required verbal and tactile cues to initiate and at times he was observed to not be adequately tilting cup into mouth. One mild instance of delayed throat clearing but voice remained clear (continues to be low vocal intensity but patient communicating in some phrases now). SLP discussed plan for MBS with RN, patient and patient's spouse.   HPI HPI: Patient is a 65 year old Caucasian male with a past medical history significant for but not limited to pancreatic cancer diagnosed in 2017 with recent recurrence, diabetes mellitus type 2, hypertension, hyperlipidemia, chronic diastolic CHF with an EF of 55 to 60% and grade 1 diastolic dysfunction back in March 2022, recent right hip fracture status post repair and intramedullary nail as well as other comorbidities who presented to [facility] long ED with a chief complaint of a sudden change in mental status. In the ED is found to be septic and initial source was thought to be another UTI but then further work-up revealed that he had a liver abscess.  2 drains placed in his liver abscess      SLP Plan  Continue with current plan of care;MBS       Recommendations  Diet recommendations: NPO (NPO but allow cups sips water PRN) Medication Administration: Via alternative means Postural Changes and/or Swallow Maneuvers: Seated upright 90 degrees                Oral Care Recommendations: Staff/trained caregiver to provide oral care;Oral care BID Follow up Recommendations: 24 hour supervision/assistance;Skilled Nursing facility SLP Visit  Diagnosis: Dysphagia, unspecified (R13.10) Plan: Continue with current plan of care;MBS       GO                Sonia Baller, MA, CCC-SLP Speech Therapy

## 2020-11-08 NOTE — TOC Initial Note (Signed)
Transition of Care [facility]) - Initial/Assessment Note    Patient Details  Name: Colton Bush MRN: [id number] Date of Birth: 11/26/1955  Transition of Care [facility]) CM/SW Contact:    Leeroy Cha, RN Phone Number: 11/08/2020, 7:41 AM  Clinical Narrative:                  The patient is a 65 year old man with a history of hypertension, diabetes, hyperlipidemia, hospitalized for AMS in setting of multiple metabolic abnormalities, who is being evaluated for left subacute SDH and recurrent seizure activity. I have ordered Keppra 2 g IV x 1 now, and 1 g BID maintenance. Recommend further evaluation with EEG. Neurosurgical consult for SDH advised. 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 If it does not resolve as expected, consider repeat MRI brain.  CT HEAD: Reviewed Left holohemispheric subdural collection measuring 10 mm in thickness with mild mass effect on the left hemisphere and only trace midline shift. This is most consistent with a late subacute or chronic subdural hematoma. PLAN: patient is from [facility] and plan is to return there Expected Discharge Plan: [place] ([facility]) Barriers to Discharge: Continued Medical Work up   Patient Goals and CMS Choice Patient states their goals for this hospitalization and ongoing recovery are:: goto back to [facility] CMS Medicare.gov Compare Post Acute Care list provided to:: Patient    Expected Discharge Plan and Services Expected Discharge Plan: [place] ([facility])   Discharge Planning Services: CM Consult Post Acute Care Choice: Nursing Home Living arrangements for the past 2 months: [place]                                      Prior Living Arrangements/Services Living arrangements for the past 2 months: [place] Lives with:: Facility Resident Patient language and need for interpreter reviewed:: Yes Do you feel safe going back to the place  where you live?: Yes      Need for Family Participation in Patient Care: No (Comment) Care giver support system in place?: No (comment)   Criminal Activity/Legal Involvement Pertinent to Current Situation/Hospitalization: No - Comment as needed  Activities of Daily Living Home Assistive Devices/Equipment: Walker (specify type),Wheelchair ADL Screening (condition at time of admission) Patient's cognitive ability adequate to safely complete daily activities?: No Is the patient deaf or have difficulty hearing?: No Does the patient have difficulty seeing, even when wearing glasses/contacts?: No Does the patient have difficulty concentrating, remembering, or making decisions?: No Patient able to express need for assistance with ADLs?: Yes Does the patient have difficulty dressing or bathing?: Yes Independently performs ADLs?: No Communication: Independent Dressing (OT): Independent Grooming: Independent Feeding: Independent Bathing: Needs assistance Is this a change from baseline?: Change from baseline, expected to last >3 days Toileting: Needs assistance Is this a change from baseline?: Change from baseline, expected to last >3days In/Out Bed: Needs assistance Is this a change from baseline?: Change from baseline, expected to last >3 days Walks in Home: Needs assistance Is this a change from baseline?: Change from baseline, expected to last >3 days Does the patient have difficulty walking or climbing stairs?: Yes Weakness of Legs: Right Weakness of Arms/Hands: None  Permission Sought/Granted         Permission granted to share info w AGENCY: [facility]/ [facility]        Emotional Assessment Appearance:: Appears stated age Attitude/Demeanor/Rapport: Engaged  Affect (typically observed): Calm Orientation: : Oriented to Self,Oriented to Place,Oriented to  Time,Oriented to Situation Alcohol / Substance Use: Not Applicable Psych Involvement: No (comment)  Admission diagnosis:  Sepsis  secondary to UTI ([place]) [A41.9, N39.0] Urinary tract infection associated with indwelling urethral catheter, initial encounter ([place]) [Z79.150V, N39.0] Sepsis, due to unspecified organism, unspecified whether acute organ dysfunction present [facility]) [A41.9] Patient Active Problem List   Diagnosis Date Noted  . Pneumonia of both lower lobes due to infectious organism 11/06/2020  . Hepatic abscess 11/06/2020  . Sepsis secondary to UTI ([place]) 11/06/2020  . Sepsis ([name]) 11/05/2020  . Uncontrolled type 2 diabetes mellitus with ketoacidosis without coma, with long-term current use of insulin ([place]) 11/05/2020  . Increased anion gap metabolic acidosis 69/79/4801  . Chronic diastolic CHF (congestive heart failure) ([place]) 11/05/2020  . BPH with urinary obstruction 11/05/2020  . Pressure ulcer of sacral region, stage 2 ([place]) 11/05/2020  . Closed right hip fracture, initial encounter ([place]) 10/18/2020  . Fall from ground level 10/18/2020  . Hypoglycemia 06/19/2017  . Hypothermia 06/19/2017  . Goals of care, counseling/discussion 09/29/2016  . Port catheter in place 04/11/2016  . Hypercalcemia 03/29/2016  . Adenocarcinoma of head of pancreas ([place]) 03/17/2016  . Biliary obstruction   . Obstructive jaundice due to malignant neoplasm ([place]) 02/27/2016  . DKA (diabetic ketoacidoses) 02/27/2016  . Mixed diabetic hyperlipidemia associated with type 2 diabetes mellitus ([place]) 05/05/2014  . Cervical spondylosis with myelopathy 08/18/2011  . CERUMEN IMPACTION 12/14/2008  . Hyperlipidemia, mixed 09/16/2007  . Essential hypertension 09/16/2007  . GERD without esophagitis 09/16/2007  . ESOPHAGEAL STRICTURE 04/04/2007  . HIATAL HERNIA 04/04/2007   PCP:  Wenda Low, MD Pharmacy:  No Pharmacies Listed    Social Determinants of Health (SDOH) Interventions    Readmission Risk Interventions No flowsheet data found.

## 2020-11-08 NOTE — Progress Notes (Signed)
Pt d/c to care link to [facility] RM [phone]. A/OX4 [id number]  R Wrist. Did not appear to be in distress.

## 2020-11-08 NOTE — Progress Notes (Signed)
PROGRESS NOTE    ASIM GERSTEN  UXN:[id number] DOB: [birth year] DOA: 11/05/2020 PCP: Wenda Low, MD   Brief Narrative:  Patient is a 65 year old Caucasian male with a past medical history significant for but not limited to pancreatic cancer diagnosed in 2017 with recent recurrence, diabetes mellitus type 2, hypertension, hyperlipidemia, chronic diastolic CHF with an EF of 55 to 60% and grade 1 diastolic dysfunction back in March 2022, recent right hip fracture status post repair and intramedullary nail as well as other comorbidities who presented to [facility] long ED with a chief complaint of a sudden change in mental status.  Patient was extremely lethargic and not able to provide a subjective history.  He recently had a hospitalization from March 21 until March 31 after a fall and suffering a right hip fracture.  Hospitalization at that time was complicated by urinary retention requiring Foley catheter placement.  Additionally he is thought to be suffering from an E. coli UTI so he was placed on antibiotics.  Finally the resident was also found to have 1 blood culture positive for strep angina gnosis and was initially treated with IV ceftriaxone.  He was evaluated by infectious disease and repeat blood cultures ended up with no growth and patient was cleared to proceed with a right hip repair.  He underwent fixation with intramedullary nail on 10/25/2020 and is eventually discharged to [facility] on 331.  He is slowly gaining his strength back at [place] the last day however had a sudden change in his mental status and begin developing lethargy 24 to 48 hours ago.  This rapidly progressed and patient became quite weak had a poor appetite and lethargic.  Over the span that time he also exhibited dry nonproductive cough.  The facility contacted EMS and patient was brought to [facility] for evaluation.  In the ED is found to be septic and initial source was thought to be  another UTI but then further work-up revealed that he had a liver abscess.  IR was consulted to have the drain placement in his liver abscess and ID was consulted for antibiotic management.  Of note patient was also found to be in DKA upon evaluation and remained on insulin drip until this evening when he is being transitioned.  Patient is doing a little on 11/07/20 and not complaining of any pain.  He was still lethargic and somnolent but easily arousable and a little confused.  Infectious disease recommended continuing IV Unasyn for now as the culture from the abscess showed gram-positive cocci.  Interventional radiology recommending continue current drain management with every shift flushes and monitoring output and repeating CT scan when output is less than 10 cc a day for possible removal.  PT OT recommending SNF. SLP evaluating and recommending NPO except sips with water when more awake.   Overnight the patient became more lethargic and because of this a MRI was obtained.  MRI showed left-sided subdural hematoma and it was unclear if this was acute or chronic in nature so a CT of the head was done without contrast to clarify.  While the patient was being transported to the [place] had 3 episodes of facial twitching and involuntary movement of his face and head tremors while he is being transported and subsequently telemetry neurology was consulted for seizure-like activity.  Telemetry neurology evaluated and the patient was loaded with IV Keppra and placed on maintenance Keppra dosing.  I discussed the case with neurosurgery Dr. Emelda Brothers about  his subdural hematoma and MRI findings and Dr. Venetia Constable feels that this is early late subacute subdural hematoma and there is about 65 to 67 weeks old and feels that there is nothing to do at this time from a neurosurgical standpoint.  I spoke with neurology this morning as well and Dr. Leonel Ramsay feels the patient needs to be evaluated for a seizure  activity and also be monitored on continuous EEG so we will transfer the patient to [facility].  Patient's wife was updated at bedside  Assessment & Plan:   Principal Problem:   Hepatic abscess Active Problems:   Essential hypertension   GERD without esophagitis   Mixed diabetic hyperlipidemia associated with type 2 diabetes mellitus ([place])   Adenocarcinoma of head of pancreas (HCC)   Sepsis ([place])   Uncontrolled type 2 diabetes mellitus with ketoacidosis without coma, with long-term current use of insulin (HCC)   Increased anion gap metabolic acidosis   Chronic diastolic CHF (congestive heart failure) (HCC)   BPH with urinary obstruction   Pressure ulcer of sacral region, stage 2 (HCC)   Pneumonia of both lower lobes due to infectious organism   Sepsis secondary to UTI ([place])  Severe sepsis secondary to strep anginosis likely bacteremia associated with liver abscess and bilateral lower lobe pneumonia present on admission and ? E Coli UTI poA  -Initially felt the patient was suffering from a persistent catheter associated UTI however CT imaging of the chest abdomen pelvis found the patient to have an abscess located in the posterior right lobe of the liver. -Additionally he is also found to have bilateral airspace disease in the lower lobes suggestive of pneumonia -Recently he was having a strep anginosis which could have precipitated bacteremia and an pneumonia and liver abscess -He met SIRS criteria on admission being febrile, have a leukocytosis, source of infection as well being tachycardic and being altered -Blood cultures x2 were obtained and patient was given aggressive IV fluid hydration with isotonic fluids -Treated with broad-spectrum IV antibiotics with IV vancomycin, IV cefepime and IV Flagyl and this was narrowed to IV Unasyn by Infectious diseases -Blood Cx x2 showed NGTD at 2 Days  -Will add Breathing Treatments with Xopenex 0.63 mg and Atrovent 0.5 mg BID -Sepsis physiology  is improving  -Interventional radiology was consulted and patient ended up having 2 drains placed yesterday; Drain Cx obtained and showed Abundant Klebsiella Pneumoniae and Cx was reincubated for better growth; There were also Rare Gram + Cocci in Pairs noted. -Patient was placed in the stepdown unit given his severe sepsis -SLP evaluated and recommending NPO except Sips with Water yesterday and diet advanced to FULL as below -Infectious diseases recommended following the cultures from aspirate of the liver abscesses and repeating echocardiogram to evaluate the patient's heart valve; ECHOCardiogram showed "No evidence of valvular vegetations on this transthoracic echocardiogram, however, visualization of the valves  Was quite limited. Would recommend a transesophageal echocardiogram to exclude infective endocarditis if    clinically indicated." -Will Defer to ID to make decision about TEE  -Patient was given IV fluids aggressively with LR and this was changed to D5 LR given his DKA -Currently remains on D5 and LR at 125 mL's per hour and will reduce this to 75 MLS per hour now it is DKA is resolving and stop IVF soon when tolerating FULL Liquid Diet adequately  -Urinalysis done on admission showed a hazy appearance with greater than 500 glucose, moderate hemoglobin, 80 ketones, trace leukocytes, 30 protein, rare  bacteria, 6-10 RBCs per high-power field, 0-5 squamous epithelial cells, greater than 50 WBCs the urine culture now showing 60,000 CFU of E Coli  -CRP was 19.4 -Procalcitonin level was 0.56 -> 0.59 and today 0.27 -The WBC was significantly elevated at 31,000 and now trending down to 11,700 -We will continue monitor temperature curve -He is somnolent and drowsy yesterday and became unresponsive and had seizure-like activity so he was placed on Keppra; he is little bit more awake today and SLP evaluated and recommending full liquid diet for now with pure consistency  Acute Toxic Encephalopathy  in the setting of Infection, rule out seizures -Likely has a encephalopathy in the setting of sepsis however became unresponsive last night and had twitching and seizure-like activity -MRI done and showed "Age indeterminate, mixed left subarachnoid and subdural hematoma measuring 10 mm in thickness. Head CT recommended for better temporal characterization. 2. Areas of suspected acute ischemia in the left temporal and parietal lobes, though diffusion-weighted imaging is complicated by the adjacent blood products and their magnetic susceptibility effects." -While he was on his way to get a Head CT had twitching and head Tremors concerning for Seizure like activity  -Head CT done and showed "Left holohemispheric subdural collection measuring 10 mm in thickness with mild mass effect on the left hemisphere and only trace midline shift. This is most consistent with a late subacute or chronic subdural hematoma."  -I discussed the case with neurosurgery Dr. Zada Finders who feels that this is an early late subacute subarachnoid and subdural hematoma and feels that there is no surgical intervention at this time needed given that he feels that this is at least 105 to 64 weeks old but he feels that if the patient starts having seizures better not controlled by medications he will operate as a last ditch effort -Overnight neurology was consulted with telemetry neurology and they loaded the patient with IV Keppra and then placed on maintenance IV 1 g twice daily.  I spoke with her neurologist here on staff and ordered an EEG in Dr. Leonel Ramsay recommends transferring the patient to [facility] for further neurological work-up and assessment and for continuous EEG monitoring -Patient will be more awake today and SLP evaluated and have placed the patient on a full liquid diet with pure medication consistency  Diabetic Ketoacidosis without coma in the setting of infection as above, improved Metabolic Acidosis -Overnight the  patient has developed progressively worsening metabolic acidosis with anion gap and markedly elevated beta hydroxybutyrate with persisting substantial hyperglycemia all concerning for developing diabetic ketoacidosis -This had likely been precipitated by underlying sepsis -Patient's beta hydroxybutyrate acid went from 7.90 and trended all the way down to 0.33 -He was initiated on insulin infusion and transferred to stepdown and now that his gap is closed and his numbers have improved he was transitioned off of the insulin drip to long-acting insulin Levemir 20 units twice daily nitiating insulin infusion and transferring to stepdown unit for continued management -Performed serial chemistries and serial beta hydroxybutyrate levels and they were all improving -Hydrating patient aggressively with intravenous isotonic fluids and as above is now on D5 inLR at 75 mL's per hour -Patient to remain NPO given SLP recc's  -Metabolic acidosis is improved significantly now CO2 is 21, anion gap is 9, chloride level is 107 -Continue monitor and trend repeat CMP in a.m. -CBG's now ranging from 75-159  Essential Hypertension -Holding home regimen of oral antipretenses due to tenuous oral intake initially but now he is more  awake -C/w As needed intravenous antihypertensives is for markedly low blood pressure. -Currently holding his amlodipine and benazepril -Continue to Monitor BP per Protocol  -Last BP was 123/57  Mixed diabetic hyperlipidemia associated with type 2 diabetes mellitus ([place]) -Continuing home regimen of statin therapy if patient is able to tolerate oral intake as well as his omega-3  Depression -Resume Cymbalta when able to tolerate p.o.  Adenocarcinoma of head of pancreas ([place]) -Diagnosed in 2017 -Status post chemotherapy and radiation at the time -Status post Whipple procedure -Now with recurrence of disease in recent months -Patient is expected to undergo palliative outpatient  radiation therapy in the coming weeks. -May consult his oncologist while he is inpatient -Likely resume his Creon when tolerating po   Chronic Diastolic CHF (congestive heart failure) ([place]) -No evidence of volume overload at this time -Strict I's and O's and daily weights will need to continue monitor for signs and symptoms of volume overload given his aggressive fluid hydration -Patient is +1,027.9 mL and will need a Repeat CXR in the AM   BPH with urinary obstruction -Foley catheter was placed during last hospitalization due to outlet obstruction and remains in place today. -Foley catheter has been exchanged during this hospitalization -Continuing home regimen of Tamsulosin 0.4 mg po Daily when tolerating po  Pressure ulcer of sacral region, stage 2 ([place]) -Wound care consultation placed -Present on admission  GERD without Esophagitis -Daily PPI IV pantoprazole 40 mg every 24 for now  Hypokalemia -Patient's potassium dropped to 2.7 -Replete with IV K-Phos 20 mmol yesterday; Currentlly being replete with IV KCl 60 mEq -Mag level was 1.7 so this will also be repeated with IV Mag Sulfate 2 grams -Continue monitor and replete as necessary  -repeat CMP in a.m.  Normocytic Anemia -[place] was Hemoconcentrated on Admission in the setting of dehydration from DKA -Patient's Hgb/Hct went from 12.9/38.0 -> 11.0/35.6 -> 10.3/31.7 -> 9.9/31.3 -Check Anemia Panel in the AM  -Continue to Monitor for S/Sx of Bleeding; No overt bleeding noted -Repeat CBC in the AM   Hyperbilirubinemia -Patient's T Bili went from from 1.6 -> 1.9 -> 0.1 -> 0.8 -> 0.7 -Continue to Monitor and Trend -Repeat CMP in the AM   Hypophosphatemia -Patient's phosphorus level was 3.0 -Replete with IV K-Phos 20 mmol yesterday  -Continue to monitor replete as necessary -Repeat phosphorus level in a.m.  Hip fracture in the setting of fall status post intramedullary nailing -Patient is POD 14 by Dr. Lyla Glassing on  10/25/2020 -We will need PT OT to evaluate and treat -Will start Enoxaparin 40 mg sq q24h until can take po safely to resume Apixaban; May not need Apixaban as it was used for VTE prophylaxis being 2.5 mg; Will need to Clarify and Resume appropriately  Thrombocytosis -[place] in the setting of Infection -Platelet Count went from 529 -> 635 -> 470 -> 410 -Continue to Monitor and Trend -Repeat CMP in the AM   DVT prophylaxis: SCDs; will add DVT prophylaxis in the morning given his recent drain as his Eliquis 2.5 mg p.o. twice daily has been held and will add Enoxaparin 40 mg sq q24h; Dr. Zada Finders feels it is ok to start VTE Prophylaxis again given chronic findings  Code Status: FULL CODE Family Communication: Discussed with the wife at bedside  Disposition Plan: Pending further clinical improvement and will transfer to [facility] for further Neuro Workup and Evaluation   Status is: Inpatient  Remains inpatient appropriate because:Unsafe d/c plan, IV treatments appropriate due to intensity  of illness or inability to take PO and Inpatient level of care appropriate due to severity of illness   Dispo: The patient is from: SNF              Anticipated d/c is to: SNF              Patient currently is not medically stable to d/c.   Difficult to place patient No  Consultants:   IR  Infectious Diseases   Discussed the case with Neurosurgery Dr. Zada Finders  Neurology    Procedures:  Procedure: Image guided drain placement, 2 x hepatic abscess.  2 x 68F pigtail drain into right liver.  ECHOCARDIOGRAM IMPRESSIONS    1. Limited echo, poor endocardial definition. Defininty contrast not  used.  2. Left ventricular ejection fraction, by estimation, is 60 to 65%. The  left ventricle has normal function. Left ventricular endocardial border  not optimally defined to evaluate regional wall motion. There is mild left  ventricular hypertrophy.  Indeterminate diastolic filling due to E-A fusion.   3. Right ventricular systolic function is hyperdynamic. The right  ventricular size is normal.  4. The mitral valve is abnormal. No evidence of mitral valve  regurgitation.  5. The aortic valve is tricuspid. Aortic valve regurgitation is not  visualized.   Comparison(s): Changes from prior study are noted. 10/02/2020: LVEF 55-60%.   Conclusion(s)/Recommendation(s): No evidence of valvular vegetations on  this transthoracic echocardiogram, however, visualization of the valves  was quite limited. Would recommend a transesophageal echocardiogram to  exclude infective endocarditis if  clinically indicated.   FINDINGS  Left Ventricle: Left ventricular ejection fraction, by estimation, is 60  to 65%. The left ventricle has normal function. Left ventricular  endocardial border not optimally defined to evaluate regional wall motion.  The left ventricular internal cavity  size was normal in size. There is mild left ventricular hypertrophy.  Indeterminate diastolic filling due to E-A fusion.   Right Ventricle: The right ventricular size is normal. No increase in  right ventricular wall thickness. Right ventricular systolic function is  hyperdynamic.   Left Atrium: Left atrial size was not well visualized.   Right Atrium: Right atrial size was not well visualized.   Pericardium: There is no evidence of pericardial effusion.   Mitral Valve: The mitral valve is abnormal. There is mild thickening of  the mitral valve leaflet(s). No evidence of mitral valve regurgitation.   Tricuspid Valve: The tricuspid valve is not well visualized. Tricuspid  valve regurgitation is not demonstrated.   Aortic Valve: The aortic valve is tricuspid. Aortic valve regurgitation is  not visualized.   Pulmonic Valve: The pulmonic valve was not well visualized. Pulmonic valve  regurgitation is not visualized.   Aorta: The aortic arch was not well visualized and the ascending aorta was  not well  visualized.   Venous: The inferior vena cava was not well visualized.   IAS/Shunts: No atrial level shunt detected by color flow Doppler.   MRI as above  Head CT as above   Antimicrobials:  Anti-infectives (From admission, onward)   Start     Dose/Rate Route Frequency Ordered Stop   11/07/20 2200  Ampicillin-Sulbactam (UNASYN) 3 g in sodium chloride 0.9 % 100 mL IVPB  Status:  Discontinued        3 g 200 mL/hr over 30 Minutes Intravenous Every 6 hours 11/06/20 2135 11/07/20 0943   11/07/20 1800  Ampicillin-Sulbactam (UNASYN) 3 g in sodium chloride 0.9 % 100 mL IVPB  3 g 200 mL/hr over 30 Minutes Intravenous Every 6 hours 11/07/20 1219     11/07/20 1600  Ampicillin-Sulbactam (UNASYN) 3 g in sodium chloride 0.9 % 100 mL IVPB  Status:  Discontinued        3 g 200 mL/hr over 30 Minutes Intravenous Every 6 hours 11/07/20 0943 11/07/20 1219   11/07/20 1030  Ampicillin-Sulbactam (UNASYN) 3 g in sodium chloride 0.9 % 100 mL IVPB        3 g 200 mL/hr over 30 Minutes Intravenous  Once 11/07/20 0943 11/07/20 1213   11/06/20 1800  Ampicillin-Sulbactam (UNASYN) 3 g in sodium chloride 0.9 % 100 mL IVPB  Status:  Discontinued        3 g 200 mL/hr over 30 Minutes Intravenous Every 6 hours 11/06/20 1442 11/06/20 2135   11/06/20 1100  cefTRIAXone (ROCEPHIN) 2 g in sodium chloride 0.9 % 100 mL IVPB  Status:  Discontinued        2 g 200 mL/hr over 30 Minutes Intravenous Every 24 hours 11/06/20 0855 11/06/20 1435   11/06/20 0800  vancomycin (VANCOREADY) IVPB 1250 mg/250 mL  Status:  Discontinued        1,250 mg 166.7 mL/hr over 90 Minutes Intravenous Every 12 hours 11/06/20 0643 11/06/20 0854   11/06/20 0700  metroNIDAZOLE (FLAGYL) IVPB 500 mg  Status:  Discontinued        500 mg 100 mL/hr over 60 Minutes Intravenous Every 8 hours 11/06/20 0638 11/06/20 1452   11/06/20 0400  ceFEPIme (MAXIPIME) 2 g in sodium chloride 0.9 % 100 mL IVPB  Status:  Discontinued        2 g 200 mL/hr over 30  Minutes Intravenous Every 8 hours 11/05/20 2358 11/06/20 0853   11/05/20 1845  vancomycin (VANCOREADY) IVPB 1500 mg/300 mL        1,500 mg 150 mL/hr over 120 Minutes Intravenous  Once 11/05/20 1830 11/05/20 2237   11/05/20 1830  ceFEPIme (MAXIPIME) 2 g in sodium chloride 0.9 % 100 mL IVPB        2 g 200 mL/hr over 30 Minutes Intravenous  Once 11/05/20 1824 11/05/20 2103   11/05/20 1830  metroNIDAZOLE (FLAGYL) IVPB 500 mg        500 mg 100 mL/hr over 60 Minutes Intravenous  Once 11/05/20 1824 11/05/20 2140   11/05/20 1830  vancomycin (VANCOCIN) IVPB 1000 mg/200 mL premix  Status:  Discontinued        1,000 mg 200 mL/hr over 60 Minutes Intravenous  Once 11/05/20 1824 11/05/20 1830       Subjective: Seen and examined at bedside this morning he was still a little somnolent and lethargic but much more awake today than he was yesterday and answering questions.  Denies any pain.  Still have some mumbling of his words.  Wife is at bedside and she is updated.  Patient is to be transferred to [facility] for further neurological work-up.  He is much more interactive today than he was yesterday.  No other concerns or complaints at this time.  Objective: Vitals:   11/08/20 0816 11/08/20 0826 11/08/20 0900 11/08/20 1000  BP: 129/60  (!) 127/59 (!) 123/57  Pulse: 62  70 67  Resp: _0 Temp:      TempSrc:      SpO2: 99% 99% 98% 98%  Weight:      Height:        Intake/Output Summary (Last 24 hours) at 11/08/2020 1145 Last  data filed at 11/08/2020 0900 Gross per 24 hour  Intake 2303.64 ml  Output 1453 ml  Net 850.64 ml   Filed Weights   11/05/20 1748 11/06/20 0800 11/06/20 0903  Weight: 89 kg 79 kg 79 kg   Examination: Physical Exam:  Constitutional: Chronically ill-appearing older appearing than his stated age 37 male in no acute distress and is a little more awake today and still fatigued appearing Eyes: Lids and conjunctivae normal, sclerae anicteric  ENMT: External Ears,  Nose appear normal. Grossly normal hearing.  Neck: Appears normal, supple, no cervical masses, normal ROM, no appreciable thyromegaly; no JVD Respiratory: Diminished to auscultation bilaterally with coarse breath sounds at the bases, no wheezing, rales, rhonchi or crackles. Normal respiratory effort and patient is not tachypenic.  Wearing supplemental oxygen via nasal cannula Cardiovascular: Mildly tachycardic, no murmurs / rubs / gallops. S1 and S2 auscultated.  No appreciable extremity edema Abdomen: Soft, non-tender, non-distended.  2 drains in place. Bowel sounds positive.  GU: Deferred. Musculoskeletal: No clubbing / cyanosis of digits/nails. No joint deformity upper and lower extremities.  Skin: No rashes, lesions, ulcers on limited skin evaluation and I did not turn him to view his backside. No induration; Warm and dry.  Neurologic: CN 2-12 grossly intact with no focal deficits.  Romberg sign cerebellar reflexes not assessed.  Psychiatric: Slightly impaired judgment and insight.  He is much more alert and awake compared to yesterday. Normal mood and appropriate affect.   Data Reviewed: I have personally reviewed following labs and imaging studies  CBC: Recent Labs  Lab 11/05/20 1930 11/05/20 2007 11/06/20 0337 11/07/20 0124 11/08/20 0237  WBC 21.3*  --  31.0* 22.6* 11.7*  NEUTROABS 20.1*  --  29.4* 20.9* 9.9*  HGB 12.5* 12.9* 11.0* 10.3* 9.9*  HCT 39.1 38.0* 35.6* 31.7* 31.1*  MCV 94.7  --  95.4 93.0 93.4  PLT 529*  --  635* 470* 892*   Basic Metabolic Panel: Recent Labs  Lab 11/06/20 0337 11/06/20 0716 11/06/20 1424 11/06/20 1820 11/06/20 2243 11/07/20 0124 11/08/20 0237  NA 137   < > 139 136 136 137 136  K 4.4   < > 3.0* 3.0* 3.4* 3.2* 2.7*  CL 107   < > 110 108 108 107 104  CO2 10*   < > 21* 20* 21* 21* 24  GLUCOSE 364*   < > 139* 144* 153* 169* 79  BUN 20   < > _0 CREATININE 0.89   < > 0.57* 0.47* 0.48* 0.43* 0.45*  CALCIUM 8.7*   < > 8.3* 8.1*  8.1* 8.2* 7.8*  MG 1.8  --  1.7  --   --  1.9 1.7  PHOS  --   --  2.4*  --   --  2.2* 3.0   < > = values in this interval not displayed.   GFR: Estimated Creatinine Clearance: 92.5 mL/min (A) (by C-G formula based on SCr of 0.45 mg/dL (L)). Liver Function Tests: Recent Labs  Lab 11/05/20 1930 11/06/20 0337 11/06/20 1424 11/07/20 0124 11/08/20 0237  AST _1 45* 50*  ALT _2 40  ALKPHOS 336* 303* 252* 230* 258*  BILITOT 1.6* 1.9* 0.1* 0.8 0.7  PROT 8.2* 7.8 6.7 6.2* 6.2*  ALBUMIN 2.8* 2.6* 2.2* 2.1* 2.1*   No results for input(s): LIPASE, AMYLASE in the last 168 hours. Recent Labs  Lab 11/07/20 0851  AMMONIA 19   Coagulation Profile: Recent Labs  Lab 11/05/20 1930 11/06/20 0337  INR 1.5* 1.6*   Cardiac Enzymes: No results for input(s): CKTOTAL, CKMB, CKMBINDEX, TROPONINI in the last 168 hours. BNP (last 3 results) No results for input(s): PROBNP in the last 8760 hours. HbA1C: No results for input(s): HGBA1C in the last 72 hours. CBG: Recent Labs  Lab 11/07/20 2109 11/07/20 2352 11/08/20 0146 11/08/20 0357 11/08/20 0754  GLUCAP 87 79 75 92 86   Lipid Profile: No results for input(s): CHOL, HDL, LDLCALC, TRIG, CHOLHDL, LDLDIRECT in the last 72 hours. Thyroid Function Tests: No results for input(s): TSH, T4TOTAL, FREET4, T3FREE, THYROIDAB in the last 72 hours. Anemia Panel: No results for input(s): VITAMINB12, FOLATE, FERRITIN, TIBC, IRON, RETICCTPCT in the last 72 hours. Sepsis Labs: Recent Labs  Lab 11/05/20 1914 11/06/20 0337 11/07/20 0851 11/08/20 0237  PROCALCITON  --  0.56  0.56 0.59 0.27  LATICACIDVEN 1.6  --   --   --     Recent Results (from the past 240 hour(s))  Resp Panel by RT-PCR (Flu A&B, Covid) Nasopharyngeal Swab     Status: None   Collection Time: 11/05/20  7:14 PM   Specimen: Nasopharyngeal Swab; Nasopharyngeal(NP) swabs in vial transport medium  Result Value Ref Range Status   SARS Coronavirus 2 by RT PCR NEGATIVE  NEGATIVE Final    Comment: (NOTE) SARS-CoV-2 target nucleic acids are NOT DETECTED.  The SARS-CoV-2 RNA is generally detectable in upper respiratory specimens during the acute phase of infection. The lowest concentration of SARS-CoV-2 viral copies this assay can detect is 138 copies/mL. A negative result does not preclude SARS-Cov-2 infection and should not be used as the sole basis for treatment or other patient management decisions. A negative result may occur with  improper specimen collection/handling, submission of specimen other than nasopharyngeal swab, presence of viral mutation(s) within the areas targeted by this assay, and inadequate number of viral copies(<138 copies/mL). A negative result must be combined with clinical observations, patient history, and epidemiological information. The expected result is Negative.  Fact Sheet for Patients:  EntrepreneurPulse.com.au  Fact Sheet for Healthcare Providers:  IncredibleEmployment.be  This test is no t yet approved or cleared by the [place] FDA and  has been authorized for detection and/or diagnosis of SARS-CoV-2 by FDA under an Emergency Use Authorization (EUA). This EUA will remain  in effect (meaning this test can be used) for the duration of the COVID-19 declaration under Section 564(b)(1) of the Act, 21 U.S.C.section 360bbb-3(b)(1), unless the authorization is terminated  or revoked sooner.       Influenza A by PCR NEGATIVE NEGATIVE Final   Influenza B by PCR NEGATIVE NEGATIVE Final    Comment: (NOTE) The Xpert Xpress SARS-CoV-2/FLU/RSV plus assay is intended as an aid in the diagnosis of influenza from Nasopharyngeal swab specimens and should not be used as a sole basis for treatment. Nasal washings and aspirates are unacceptable for Xpert Xpress SARS-CoV-2/FLU/RSV testing.  Fact Sheet for Patients: EntrepreneurPulse.com.au  Fact Sheet for Healthcare  Providers: IncredibleEmployment.be  This test is not yet approved or cleared by the [place] FDA and has been authorized for detection and/or diagnosis of SARS-CoV-2 by FDA under an Emergency Use Authorization (EUA). This EUA will remain in effect (meaning this test can be used) for the duration of the COVID-19 declaration under Section 564(b)(1) of the Act, 21 U.S.C. section 360bbb-3(b)(1), unless the authorization is terminated or revoked.  Performed at [facility], [place] [address] [place], [place] [address]  Blood Culture (routine x 2)     Status: None (Preliminary result)   Collection Time: 11/05/20  8:36 PM   Specimen: BLOOD  Result Value Ref Range Status   Specimen Description   Final    BLOOD BLOOD RIGHT FOREARM Performed at [facility] [address], [place], [place] [address]    Special Requests   Final    BOTTLES DRAWN AEROBIC AND ANAEROBIC Blood Culture results may not be optimal due to an inadequate volume of blood received in culture bottles Performed at [facility] [address], [place], [place] [address]    Culture   Final    NO GROWTH 2 DAYS Performed at [facility] [address] [place], [place] [address]    Report Status PENDING  Incomplete  Blood Culture (routine x 2)     Status: None (Preliminary result)   Collection Time: 11/05/20  8:38 PM   Specimen: BLOOD  Result Value Ref Range Status   Specimen Description   Final    BLOOD Performed at [facility] [address] [place], [place] [address]    Special Requests   Final    BOTTLES DRAWN AEROBIC AND ANAEROBIC Blood Culture results may not be optimal due to an inadequate volume of blood received in culture bottles Performed at [facility] [address], [place], [place] [address]    Culture   Final    NO GROWTH 2 DAYS Performed at [facility]  [address] [name], [place] [address]    Report Status PENDING  Incomplete  Urine culture     Status: Abnormal   Collection Time: 11/05/20  8:38 PM   Specimen: In/Out Cath Urine  Result Value Ref Range Status   Specimen Description   Final    IN/OUT CATH URINE Performed at [facility] [address] [place], [place] [address]    Special Requests   Final    NONE Performed at [facility], [place] [address], [place], [place] [address]    Culture 60,000 COLONIES/mL ESCHERICHIA COLI (A)  Final   Report Status 11/08/2020 FINAL  Final   Organism ID, Bacteria ESCHERICHIA COLI (A)  Final      Susceptibility   Escherichia coli - MIC*    AMPICILLIN 4 SENSITIVE Sensitive     CEFAZOLIN <=4 SENSITIVE Sensitive     CEFEPIME <=0.12 SENSITIVE Sensitive     CEFTRIAXONE <=0.25 SENSITIVE Sensitive     CIPROFLOXACIN <=0.25 SENSITIVE Sensitive     GENTAMICIN <=1 SENSITIVE Sensitive     IMIPENEM <=0.25 SENSITIVE Sensitive     NITROFURANTOIN <=16 SENSITIVE Sensitive     TRIMETH/SULFA <=20 SENSITIVE Sensitive     AMPICILLIN/SULBACTAM <=2 SENSITIVE Sensitive     PIP/TAZO <=4 SENSITIVE Sensitive     * 60,000 COLONIES/mL ESCHERICHIA COLI  MRSA PCR Screening     Status: None   Collection Time: 11/06/20  8:42 AM   Specimen: Nasopharyngeal  Result Value Ref Range Status   MRSA by PCR NEGATIVE NEGATIVE Final    Comment:        The GeneXpert MRSA Assay (FDA approved for NASAL specimens only), is one component of a comprehensive MRSA colonization surveillance program. It is not intended to diagnose MRSA infection nor to guide or monitor treatment for MRSA infections. Performed at [facility], [place] [address] [place], [place] [address]   Aerobic/Anaerobic Culture (surgical/deep wound)  Status: None (Preliminary result)   Collection Time: 11/06/20  3:45 PM   Specimen: Liver; Abscess  Result Value Ref Range Status   Specimen Description   Final     LIVER Performed at [facility] [address], [place], [place] [address]    Special Requests   Final    NONE Performed at [facility], [place] [address] [place], [place] [address]    Gram Stain FEW GRAM VARIABLE ROD  Final   Culture   Final    ABUNDANT KLEBSIELLA PNEUMONIAE CULTURE REINCUBATED FOR BETTER GROWTH SUSCEPTIBILITIES TO FOLLOW Performed at [facility], [place] [address] [place], [place] [address]    Report Status PENDING  Incomplete  Aerobic/Anaerobic Culture w Gram Stain (surgical/deep wound)     Status: None (Preliminary result)   Collection Time: 11/06/20  3:46 PM   Specimen: Liver; Abscess  Result Value Ref Range Status   Specimen Description LIVER ABSCESS  Final   Special Requests MORE INFERIOR RIGHT LIVER  Final   Gram Stain   Final    FEW WBC PRESENT, PREDOMINANTLY PMN FEW GRAM NEGATIVE RODS RARE GRAM POSITIVE COCCI IN PAIRS    Culture   Final    CULTURE REINCUBATED FOR BETTER GROWTH Performed at [facility], [place] [address] [place], [place] [address]    Report Status PENDING  Incomplete     RN Pressure Injury Documentation: Pressure Injury 11/07/20 Heel Right Stage 1 -  Intact skin with non-blanchable redness of a localized area usually over a bony prominence. (Active)  11/07/20 0800  Location: Heel  Location Orientation: Right  Staging: Stage 1 -  Intact skin with non-blanchable redness of a localized area usually over a bony prominence.  Wound Description (Comments):   Present on Admission:      Pressure Injury 11/07/20 Sacrum Stage 1 -  Intact skin with non-blanchable redness of a localized area usually over a bony prominence. (Active)  11/07/20 2000  Location: Sacrum  Location Orientation:   Staging: Stage 1 -  Intact skin with non-blanchable redness of a localized area usually over a bony prominence.  Wound Description (Comments):   Present on Admission:      Pressure Injury 11/07/20 Heel  Right Deep Tissue Pressure Injury - Purple or maroon localized area of discolored intact skin or blood-filled blister due to damage of underlying soft tissue from pressure and/or shear. (Active)  11/07/20 2000  Location: Heel  Location Orientation: Right  Staging: Deep Tissue Pressure Injury - Purple or maroon localized area of discolored intact skin or blood-filled blister due to damage of underlying soft tissue from pressure and/or shear.  Wound Description (Comments):   Present on Admission:      Pressure Injury 11/07/20 Sacrum Deep Tissue Pressure Injury - Purple or maroon localized area of discolored intact skin or blood-filled blister due to damage of underlying soft tissue from pressure and/or shear. (Active)  11/07/20 2000  Location: Sacrum  Location Orientation:   Staging: Deep Tissue Pressure Injury - Purple or maroon localized area of discolored intact skin or blood-filled blister due to damage of underlying soft tissue from pressure and/or shear.  Wound Description (Comments):   Present on Admission:      Estimated body mass index is 24.99 kg/m as calculated from the following:   Height as of this encounter: _0  (1.778 m).   Weight as of this encounter: 79 kg.  Malnutrition Type:   Malnutrition Characteristics:   Nutrition  Interventions:    Radiology Studies: CT HEAD WO CONTRAST  Result Date: 11/07/2020 CLINICAL DATA:  Subdural hematoma EXAM: CT HEAD WITHOUT CONTRAST TECHNIQUE: Contiguous axial images were obtained from the base of the skull through the vertex without intravenous contrast. COMPARISON:  Brain MRI same day FINDINGS: Brain: Left holohemispheric subdural collection is diffusely hypodense and measures 10 mm in thickness. Mass effect on the left hemisphere with only trace midline shift. No hydrocephalus. No intraparenchymal hemorrhage. Vascular: No hyperdense vessel or unexpected calcification. Skull: Normal. Negative for fracture or focal lesion.  Sinuses/Orbits: No acute finding. Other: None. IMPRESSION: Left holohemispheric subdural collection measuring 10 mm in thickness with mild mass effect on the left hemisphere and only trace midline shift. This is most consistent with a late subacute or chronic subdural hematoma. Electronically Signed   By: Ulyses Jarred M.D.   On: 11/07/2020 21:58   MR BRAIN WO CONTRAST  Addendum Date: 11/07/2020   ADDENDUM REPORT: 11/07/2020 20:54 ADDENDUM: Critical Value/emergent results were called by telephone at the time of interpretation on 11/07/2020 at 8:54 pm to provider Lovey Newcomer , who verbally acknowledged these results. Electronically Signed   By: Ulyses Jarred M.D.   On: 11/07/2020 20:54   Result Date: 11/07/2020 CLINICAL DATA:  Altered mental status EXAM: MRI HEAD WITHOUT CONTRAST TECHNIQUE: Multiplanar, multiecho pulse sequences of the brain and surrounding structures were obtained without intravenous contrast. COMPARISON:  None. FINDINGS: Brain: Left holo hemispheric extra-axial collection, likely hematoma, measuring 10 mm in thickness. Mild mass effect on the left hemisphere with minimal midline shift. There is some subarachnoid blood over the left frontal lobe. Areas of abnormal diffusion restriction in the left temporal and parietal lobes. No intraparenchymal hemorrhage. There is multifocal hyperintense T2-weighted signal within the white matter. Generalized volume loss without a clear lobar predilection. The midline structures are normal. Vascular: Major flow voids are preserved. Skull and upper cervical spine: Normal calvarium and skull base. Visualized upper cervical spine and soft tissues are normal. Sinuses/Orbits:No paranasal sinus fluid levels or advanced mucosal thickening. No mastoid or middle ear effusion. Normal orbits. IMPRESSION: 1. Age indeterminate, mixed left subarachnoid and subdural hematoma measuring 10 mm in thickness. Head CT recommended for better temporal characterization. 2. Areas of  suspected acute ischemia in the left temporal and parietal lobes, though diffusion-weighted imaging is complicated by the adjacent blood products and their magnetic susceptibility effects. Electronically Signed: By: Ulyses Jarred M.D. On: 11/07/2020 20:33   DG CHEST PORT 1 VIEW  Result Date: 11/08/2020 CLINICAL DATA:  Dyspnea EXAM: PORTABLE CHEST 1 VIEW COMPARISON:  11/07/2020 FINDINGS: Mild right basilar atelectasis or infiltrate again noted, better appreciated on prior CT examination of 11/06/2020. Mild right-sided volume loss is unchanged. No pneumothorax or pleural effusion. Cardiac size within normal limits. Pulmonary vascularity is normal. Pigtail catheter noted overlying the right upper quadrant. IMPRESSION: Stable right-sided volume loss and mild right basilar atelectasis or infiltrate. Electronically Signed   By: Fidela Salisbury MD   On: 11/08/2020 06:57   DG CHEST PORT 1 VIEW  Result Date: 11/07/2020 CLINICAL DATA:  Shortness of breath EXAM: PORTABLE CHEST 1 VIEW COMPARISON:  November 05, 2020 chest radiograph; chest CT November 06, 2020 FINDINGS: There is airspace opacity in each lung base. Lungs elsewhere clear. Heart size and pulmonary vascularity normal. No adenopathy. There is lower thoracic dextroscoliosis. There is postoperative change in the lower cervical spine. IMPRESSION: Patchy airspace opacity in the lung bases, similar to 1 day prior but increased compared to 2 days  prior. Stable cardiac silhouette. No adenopathy evident. Electronically Signed   By: Lowella Grip III M.D.   On: 11/07/2020 07:58   DG Swallowing Func-Speech Pathology  Result Date: 11/08/2020 Objective Swallowing Evaluation: Type of Study: MBS-Modified Barium Swallow Study  Patient Details Name: BLAYTON HUTTNER MRN: [id number] Date of Birth: 03/27/1956 Today's Date: 11/08/2020 Time: SLP Start Time (ACUTE ONLY): 4081 -SLP Stop Time (ACUTE ONLY): 1055 SLP Time Calculation (min) (ACUTE ONLY): 15 min Past Medical History: Past  Medical History: Diagnosis Date . Arthritis   left hand . Bronchitis 1977 . Cancer ([place]) 03/09/2016  pancreatic cancer, sees Dr. Cristino Martes at [facility]  . Depression   takes Cymbalta daily . Diabetes mellitus type II   sees Dr. Chalmers Cater  . GERD (gastroesophageal reflux disease)   takes Omeprazole daily . H/O hiatal hernia  . Hyperlipidemia   takes Zocor daily . Hypertension   takes Amlodipine daily . Hypoglycemia 06/18/2017 . Neck pain   C4-7 stenosis and herniated disc . Neuromuscular disorder ([place])   hiatal hernia . Scoliosis   slight . Spinal cord injury, C5-C7 ([place])   c4-c7 . Stiffness of hand joint   d/t cervical issues Past Surgical History: Past Surgical History: Procedure Laterality Date . ANTERIOR CERVICAL DECOMP/DISCECTOMY FUSION  08/18/2011  Procedure: ANTERIOR CERVICAL DECOMPRESSION/DISCECTOMY FUSION 3 LEVELS;  Surgeon: Winfield Cunas, MD;  Location: [facility];  Service: Neurosurgery;  Laterality: N/A;  Anterior Cervical Four-Five/Five-Six/Six-Seven Decompression with Fusion, Plating, and Bonegraft . CARPAL TUNNEL RELEASE  2013  bilateral, per Dr. Christella Noa  . COLONOSCOPY  10-30-14  per Dr. Olevia Perches, clear, repeat in 10 yrs  . egd with esophageal dilation  9-08  per Dr. Olevia Perches . ERCP N/A 03/01/2016  Procedure: ENDOSCOPIC RETROGRADE CHOLANGIOPANCREATOGRAPHY (ERCP) with brushings and stent;  Surgeon: Doran Stabler, MD;  Location: [facility];  Service: Endoscopy;  Laterality: N/A; . ESOPHAGOGASTRODUODENOSCOPY (EGD) WITH PROPOFOL N/A 05/27/2020  Procedure: ESOPHAGOGASTRODUODENOSCOPY (EGD) WITH PROPOFOL;  Surgeon: Milus Banister, MD;  Location: [facility];  Service: Endoscopy;  Laterality: N/A; . EUS N/A 03/09/2016  Procedure: ESOPHAGEAL ENDOSCOPIC ULTRASOUND (EUS) RADIAL;  Surgeon: Milus Banister, MD;  Location: [facility];  Service: Endoscopy;  Laterality: N/A; . EUS N/A 05/27/2020  Procedure: UPPER ENDOSCOPIC ULTRASOUND (EUS) RADIAL;  Surgeon: Milus Banister, MD;  Location: [facility];  Service:  Endoscopy;  Laterality: N/A; . FEMUR IM NAIL Right 10/25/2020  Procedure: INTRAMEDULLARY (IM) NAIL FEMORAL;  Surgeon: Rod Can, MD;  Location: [facility];  Service: Orthopedics;  Laterality: Right; . lymph nodes biopsy   . melanoma rt calf  1999 . PORT-A-CATH REMOVAL N/A 04/03/2019  Procedure: PORT REMOVAL;  Surgeon: Stark Klein, MD;  Location: [facility];  Service: General;  Laterality: N/A; . PORTACATH PLACEMENT Left 03/22/2016  Procedure: INSERTION PORT-A-CATH;  Surgeon: Stark Klein, MD;  Location: [facility];  Service: General;  Laterality: Left; . SPINE SURGERY   . TONSILLECTOMY    as a child . ULNAR TUNNEL RELEASE  2013  right arm, per Dr. Christella Noa  . UPPER GASTROINTESTINAL ENDOSCOPY   . WHIPPLE PROCEDURE N/A 09/19/2016  Procedure: DIAGNOSTIC LAPAROSCOPY, LAPAROSCOPIC LIVER BIOPSY, RETROPERITONEAL EXPLORATION, INTRAOPERATIVE ULTRASOUND;  Surgeon: Stark Klein, MD;  Location: [facility];  Service: General;  Laterality: N/A; HPI: Patient is a 65 year old Caucasian male with a past medical history significant for but not limited to pancreatic cancer diagnosed in 2017 with recent recurrence, diabetes mellitus type 2, hypertension, hyperlipidemia, chronic diastolic CHF with an EF of 55 to 60% and grade  1 diastolic dysfunction back in March 2022, recent right hip fracture status post repair and intramedullary nail as well as other comorbidities who presented to [facility] ED with a chief complaint of a sudden change in mental status. In the ED is found to be septic and initial source was thought to be another UTI but then further work-up revealed that he had a liver abscess.  2 drains placed in his liver abscess  Subjective: alert but drowsy Assessment / Plan / Recommendation CHL IP CLINICAL IMPRESSIONS 11/08/2020 Clinical Impression Patient presents with a mild-mod oropharyngeal dysphagia with likely impact from current level of cognitive delays and decreased overall alertness. During oral phase, patient exhibited delays in  anterior to posterior transit of thin liquids and puree solids boluses and with barium tablet in puree, exhibited mild premature spillage of puree into vallecular sinus likely due to decreased bolus cohesion. trace to mild oral residuals post initial swallow which eventually cleared with uncued subsequent swallows. During pharyngeal phase of swallow, patient exhibited min vallecular residuals with puree solids, trace to min vallecular, pyriform and posterior pharynx residuals with thin liquids. Residuals were observed to fully clear pharynx during course of the study. Flash penetration was observed with thin liquids via straw sips and cup sips, however penetrate remained above vocal cords and fully cleared laryngeal vestibule. Barium tablet transited throught pharynx without difficulty. Slight narrowing of esophagus observed at level of approximately C4-6 where he has cervical hardware from h/o surgery. This narrowing did not result in significant stasis of boluses or significantly impact pharyngeal and upper esophageal transit. SLP recommending to initiate full liquids diet and with plan to work on upgraded solids trials at bedside. SLP Visit Diagnosis -- Attention and concentration deficit following -- Frontal lobe and executive function deficit following -- Impact on safety and function --   CHL IP TREATMENT RECOMMENDATION 11/08/2020 Treatment Recommendations Therapy as outlined in treatment plan below   Prognosis 11/08/2020 Prognosis for Safe Diet Advancement Good Barriers to Reach Goals -- Barriers/Prognosis Comment -- CHL IP DIET RECOMMENDATION 11/08/2020 SLP Diet Recommendations Other (Comment);Thin liquid Liquid Administration via Cup;Straw Medication Administration Whole meds with puree Compensations Minimize environmental distractions;Slow rate;Small sips/bites Postural Changes Seated upright at 90 degrees   CHL IP OTHER RECOMMENDATIONS 11/08/2020 Recommended Consults -- Oral Care Recommendations Oral care  BID;Staff/trained caregiver to provide oral care Other Recommendations --   CHL IP FOLLOW UP RECOMMENDATIONS 11/08/2020 Follow up Recommendations 24 hour supervision/assistance;Skilled Nursing facility   [facility] IP FREQUENCY AND DURATION 11/08/2020 Speech Therapy Frequency (ACUTE ONLY) min 2x/week Treatment Duration 1 week      CHL IP ORAL PHASE 11/08/2020 Oral Phase -- Oral - Pudding Teaspoon -- Oral - Pudding Cup -- Oral - Honey Teaspoon -- Oral - Honey Cup -- Oral - Nectar Teaspoon -- Oral - Nectar Cup -- Oral - Nectar Straw -- Oral - Thin Teaspoon -- Oral - Thin Cup Delayed oral transit;Weak lingual manipulation;Holding of bolus;Reduced posterior propulsion;Lingual/palatal residue Oral - Thin Straw Lingual/palatal residue;Delayed oral transit;Weak lingual manipulation;Reduced posterior propulsion Oral - Puree Delayed oral transit;Weak lingual manipulation;Lingual/palatal residue;Reduced posterior propulsion Oral - Mech Soft -- Oral - Regular -- Oral - Multi-Consistency -- Oral - Pill Decreased bolus cohesion;Delayed oral transit;Reduced posterior propulsion;Weak lingual manipulation;Premature spillage Oral Phase - Comment --  CHL IP PHARYNGEAL PHASE 11/08/2020 Pharyngeal Phase -- Pharyngeal- Pudding Teaspoon -- Pharyngeal -- Pharyngeal- Pudding Cup -- Pharyngeal -- Pharyngeal- Honey Teaspoon -- Pharyngeal -- Pharyngeal- Honey Cup -- Pharyngeal -- Pharyngeal- Nectar Teaspoon -- Pharyngeal --  Pharyngeal- Nectar Cup -- Pharyngeal -- Pharyngeal- Nectar Straw -- Pharyngeal -- Pharyngeal- Thin Teaspoon -- Pharyngeal -- Pharyngeal- Thin Cup Pharyngeal residue - valleculae;Pharyngeal residue - pyriform;Pharyngeal residue - posterior pharnyx Pharyngeal Material enters airway, remains ABOVE vocal cords then ejected out Pharyngeal- Thin Straw Penetration/Aspiration during swallow;Pharyngeal residue - pyriform;Pharyngeal residue - posterior pharnyx;Pharyngeal residue - valleculae Pharyngeal Material enters airway, remains ABOVE  vocal cords then ejected out Pharyngeal- Puree Pharyngeal residue - valleculae Pharyngeal -- Pharyngeal- Mechanical Soft -- Pharyngeal -- Pharyngeal- Regular -- Pharyngeal -- Pharyngeal- Multi-consistency -- Pharyngeal -- Pharyngeal- Pill WFL Pharyngeal -- Pharyngeal Comment --  CHL IP CERVICAL ESOPHAGEAL PHASE 11/08/2020 Cervical Esophageal Phase Impaired Pudding Teaspoon -- Pudding Cup -- Honey Teaspoon -- Honey Cup -- Nectar Teaspoon -- Nectar Cup -- Nectar Straw -- Thin Teaspoon -- Thin Cup Other (Comment) Thin Straw -- Puree -- Mechanical Soft -- Regular -- Multi-consistency -- Pill -- Cervical Esophageal Comment -- Sonia Baller, MA, CCC-SLP Speech Therapy             CT IMAGE GUIDED DRAINAGE BY PERCUTANEOUS CATHETER  Result Date: 11/08/2020 INDICATION: 65 year old male with a history hepatic abscess referred for drainage EXAM: CT GUIDED DRAINAGE OF HEPATIC ABSCESS MEDICATIONS: The patient is currently admitted to the hospital and receiving intravenous antibiotics. The antibiotics were administered within an appropriate time frame prior to the initiation of the procedure. ANESTHESIA/SEDATION: 0 mg IV Versed 25 mcg IV Fentanyl Moderate Sedation Time:  0 The patient was continuously monitored during the procedure by the interventional radiology nurse under my direct supervision. COMPLICATIONS: None TECHNIQUE: Informed written consent was obtained from the patient after a thorough discussion of the procedural risks, benefits and alternatives. All questions were addressed. Maximal Sterile Barrier Technique was utilized including caps, mask, sterile gowns, sterile gloves, sterile drape, hand hygiene and skin antiseptic. A timeout was performed prior to the initiation of the procedure. PROCEDURE: CT of the liver was performed with the patient in right anterior oblique position. The right upper quadrant was prepped with chlorhexidine in a sterile fashion, and a sterile drape was applied covering the operative  field. A sterile gown and sterile gloves were used for the procedure. Local anesthesia was provided with 1% Lidocaine. Once we determine appropriate angle of approach for drainage, 1% lidocaine was used for local anesthesia. Using CT guidance, 2 separate 18 gauge trocar needles were advanced intercostal location towards the posterior right hepatic abscess. Once we confirmed needle tip position, modified Seldinger technique was used to place 2 separate 10 French pigtail drainage catheters. Aspiration of purulence fluid at both location confirmed adequate positioning. Both drains were sutured in position and attached to bulb suction. Final CT was acquired. Patient tolerated procedure well and remained hemodynamically stable throughout. No complications were encountered and no significant blood loss. FINDINGS: CT confirms 2 hypoechoic fluid collection in the posterior right liver. After CT-guided drainage, pigtail catheters remain adequately position with collapse of both cavities. Sample was sent from both sites. IMPRESSION: Status post CT-guided drainage of 2 separate right-sided hepatic abscess. Signed, Dulcy Fanny. Dellia Nims, RPVI [facility] Radiology Electronically Signed   By: Corrie Mckusick D.O.   On: 11/08/2020 08:21   ECHOCARDIOGRAM LIMITED  Result Date: 11/08/2020    ECHOCARDIOGRAM REPORT   Patient Name:   MYAN SUIT Date of Exam: 11/06/2020 Medical Rec #:  [id number]      Height:       70.0 in Accession #:    [id number]  Weight:       174.2 lb Date of Birth:  29-Oct-1955      BSA:          1.968 m Patient Age:    65 years       BP:           118/71 mmHg Patient Gender: M              HR:           108 bpm. Exam Location:  Inpatient Procedure: Limited Echo, Cardiac Doppler and Limited Color Doppler Indications:    Bacteremia  History:        Patient has prior history of Echocardiogram examinations, most                 recent 10/22/2020. Sepsis; Risk  Factors:Hypertension, Diabetes                 and Dyslipidemia.  Sonographer:    Johny Chess Referring Phys: [place]  1. Limited echo, poor endocardial definition. Defininty contrast not used.  2. Left ventricular ejection fraction, by estimation, is 60 to 65%. The left ventricle has normal function. Left ventricular endocardial border not optimally defined to evaluate regional wall motion. There is mild left ventricular hypertrophy. Indeterminate diastolic filling due to E-A fusion.  3. Right ventricular systolic function is hyperdynamic. The right ventricular size is normal.  4. The mitral valve is abnormal. No evidence of mitral valve regurgitation.  5. The aortic valve is tricuspid. Aortic valve regurgitation is not visualized. Comparison(s): Changes from prior study are noted. 10/02/2020: LVEF 55-60%. Conclusion(s)/Recommendation(s): No evidence of valvular vegetations on this transthoracic echocardiogram, however, visualization of the valves was quite limited. Would recommend a transesophageal echocardiogram to exclude infective endocarditis if clinically indicated. FINDINGS  Left Ventricle: Left ventricular ejection fraction, by estimation, is 60 to 65%. The left ventricle has normal function. Left ventricular endocardial border not optimally defined to evaluate regional wall motion. The left ventricular internal cavity size was normal in size. There is mild left ventricular hypertrophy. Indeterminate diastolic filling due to E-A fusion. Right Ventricle: The right ventricular size is normal. No increase in right ventricular wall thickness. Right ventricular systolic function is hyperdynamic. Left Atrium: Left atrial size was not well visualized. Right Atrium: Right atrial size was not well visualized. Pericardium: There is no evidence of pericardial effusion. Mitral Valve: The mitral valve is abnormal. There is mild thickening of the mitral valve leaflet(s). No evidence of  mitral valve regurgitation. Tricuspid Valve: The tricuspid valve is not well visualized. Tricuspid valve regurgitation is not demonstrated. Aortic Valve: The aortic valve is tricuspid. Aortic valve regurgitation is not visualized. Pulmonic Valve: The pulmonic valve was not well visualized. Pulmonic valve regurgitation is not visualized. Aorta: The aortic arch was not well visualized and the ascending aorta was not well visualized. Venous: The inferior vena cava was not well visualized. IAS/Shunts: No atrial level shunt detected by color flow Doppler. Lyman Bishop MD Electronically signed by Lyman Bishop MD Signature Date/Time: 11/08/2020/10:13:44 AM    Final    Scheduled Meds: . Chlorhexidine Gluconate Cloth  6 each Topical Daily  . insulin aspart  0-9 Units Subcutaneous Q4H  . insulin detemir  20 Units Subcutaneous BID  . ipratropium  0.5 mg Nebulization BID  . levalbuterol  0.63 mg Nebulization BID  . lipase/protease/amylase  12,000 Units Oral TID with meals  . loratadine  10 mg Oral Daily  . mouth  rinse  15 mL Mouth Rinse BID  . pantoprazole (PROTONIX) IV  40 mg Intravenous Q24H  . sodium chloride flush  5 mL Intracatheter Q8H  . tamsulosin  0.4 mg Oral Daily   Continuous Infusions: . ampicillin-sulbactam (UNASYN) IV Stopped (11/08/20 0539)  . dextrose 5% lactated ringers 75 mL/hr at 11/08/20 0551  . levETIRAcetam    . potassium chloride Stopped (11/08/20 1103)    LOS: 2 days   Kerney Elbe, DO [facility] PAGER is on [place]  If 7PM-7AM, please contact night-coverage [email or web address]

## 2020-11-08 NOTE — Consult Note (Signed)
[facility] TeleNeurology Consult Services  Stat Consult  Date of Service:   11/08/2020 02:07:27  Diagnosis:     .  R56.9 - Seizures  Impression: The patient is a 65 year old man with a history of hypertension, diabetes, hyperlipidemia, hospitalized for AMS in setting of multiple metabolic abnormalities, who is being evaluated for left subacute SDH and recurrent seizure activity. I have ordered Keppra 2 g IV x 1 now, and 1 g BID maintenance. Recommend further evaluation with EEG. Neurosurgical consult for SDH advised. 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 If it does not resolve as expected, consider repeat MRI brain.  CT HEAD: Reviewed Left holohemispheric subdural collection measuring 10 mm in thickness with mild mass effect on the left hemisphere and only trace midline shift. This is most consistent with a late subacute or chronic subdural hematoma.   Our recommendations are outlined below.  Diagnostic Studies: Routine EEG  Disposition: Neurology will follow  Free Text: Keppra 2 g IV 1 now, 1 g BID maintenance   Imaging: MRI BRAIN - 1. Age indeterminate, mixed left subarachnoid and subdural hematoma measuring 10 mm in thickness. Head CT recommended for better temporal characterization. 2. Areas of suspected acute ischemia in the left temporal and parietal lobes, though diffusion-weighted imaging is complicated by the adjacent blood products and their magnetic susceptibility effects.  Metrics: [facility] Notification Time: 11/08/2020 02:05:26 Stamp Time: 11/08/2020 02:07:27 Callback Response Time: 11/08/2020 02:09:09   ----------------------------------------------------------------------------------------------------  Chief Complaint: seizure  History of Present Illness: Patient is a 65 year old Male.  Patient hospitalized since 4/9 for AMS secondary to sepsis, hepatic abscess, PNA, and DKA. MRI brain done today for showed left  SDH, advised HCT. On way to CT patient developed facial twitching and seizure-like activity. At 1:30 AM patient developed generalized shaking activity, got Ativan. Currently lethargic with right sided weakness. Follows simple commands.   Past Medical History:     . Hypertension     . Diabetes Mellitus     . Hyperlipidemia     . cervical spine injury, pancreatic CA status post Whipple     Examination: BP(118/51), Pulse(62), Blood Glucose(160) 1A: Level of Consciousness - Arouses to minor stimulation + 1 1B: Ask Month and Age - Could Not Answer Either Question Correctly + 2 1C: Blink Eyes & Squeeze Hands - Performs Both Tasks + 0 2: Test Horizontal Extraocular Movements - Normal + 0 3: Test Visual Fields - No Visual Loss + 0 4: Test Facial Palsy (Use Grimace if Obtunded) - Normal symmetry + 0 5A: Test Left Arm Motor Drift - No Drift for 10 Seconds + 0 5B: Test Right Arm Motor Drift - Some Effort Against Gravity + 2 6A: Test Left Leg Motor Drift - No Drift for 5 Seconds + 0 6B: Test Right Leg Motor Drift - No Movement + 4 7: Test Limb Ataxia (FNF/Heel-Shin) - No Ataxia + 0 8: Test Sensation - Normal; No sensory loss + 0 9: Test Language/Aphasia - Mute/Global Aphasia: No Usable Speech/Auditory Comprehension + 3 10: Test Dysarthria - Normal + 0 11: Test Extinction/Inattention - No abnormality + 0  NIHSS Score: 12     Patient / Family was informed the Neurology Consult would occur via TeleHealth consult by way of interactive audio and video telecommunications and consented to receiving care in this manner.  Patient is being evaluated for possible acute neurologic impairment and high probability of imminent or life - threatening deterioration.I spent total of 20 minutes providing care  to this patient, including time for face to face visit via telemedicine, review of medical records, imaging studies and discussion of findings with providers, the patient and / or family.   Dr Serita Grammes   [facility] [phone]  Case [id number]

## 2020-11-08 NOTE — Progress Notes (Signed)
[facility]  Date of Admission:  11/05/2020           Reason for visit: Follow up on liver abscess  Current antibiotics: Day 2 ampicillin sulbactam (4/10--present)   Previous antibiotics: Cefepime 4/8 Ceftriaxone 4/9 Metronidazole 4/8--4/9 Vancomycin 4/8   ASSESSMENT:    1. Liver abscess: In the setting of recent strep anginosis bacteremia and status post right hip surgery with intramedullary femoral nail 10/25/2020 here with sepsis and found to have cluster of bilomas/abscess in the posterior right lobe of liver.  Largest component measuring up to 4.6 cm.  Status post IR percutaneous drain placement x 2 on 11/06/2020 with cultures growing Klebsiella pneumonia thus far. 2. Pancreatic cancer: Diagnosed 2017 status post chemo and radiation.  Status post Whipple procedure.  Now with recurrence of disease in recent months 3. Diabetes 4. Hypokalemia 5. Recent hip fracture: In the setting of fall status post intramedullary nail 6. Seizure like activity  PLAN:    . Continue ampicillin sulbactam . Follow-up IR drain cultures . Monitor drain output . Glycemic control, electrolyte replacement per primary team . Neurology to follow for seizure-like activity   Principal Problem:   Hepatic abscess Active Problems:   Essential hypertension   GERD without esophagitis   Mixed diabetic hyperlipidemia associated with type 2 diabetes mellitus ([place])   Adenocarcinoma of head of pancreas ([place])   Sepsis ([place])   Uncontrolled type 2 diabetes mellitus with ketoacidosis without coma, with long-term current use of insulin (HCC)   Increased anion gap metabolic acidosis   Chronic diastolic CHF (congestive heart failure) (HCC)   BPH with urinary obstruction   Pressure ulcer of sacral region, stage 2 (HCC)   Pneumonia of both lower lobes due to infectious organism   Sepsis secondary to UTI ([place])    MEDICATIONS:    Scheduled Meds: . Chlorhexidine Gluconate Cloth   6 each Topical Daily  . insulin aspart  0-9 Units Subcutaneous Q4H  . insulin detemir  20 Units Subcutaneous BID  . ipratropium  0.5 mg Nebulization BID  . levalbuterol  0.63 mg Nebulization BID  . lipase/protease/amylase  12,000 Units Oral TID with meals  . loratadine  10 mg Oral Daily  . mouth rinse  15 mL Mouth Rinse BID  . pantoprazole (PROTONIX) IV  40 mg Intravenous Q24H  . sodium chloride flush  5 mL Intracatheter Q8H  . tamsulosin  0.4 mg Oral Daily   Continuous Infusions: . ampicillin-sulbactam (UNASYN) IV Stopped (11/08/20 0539)  . dextrose 5% lactated ringers 75 mL/hr at 11/08/20 0551  . levETIRAcetam    . potassium chloride Stopped (11/08/20 1103)   PRN Meds:.acetaminophen **OR** acetaminophen, dextrose, hydrALAZINE, LORazepam, ondansetron **OR** ondansetron (ZOFRAN) IV, polyethylene glycol  SUBJECTIVE:   24 hour events:  Afebrile, T-max 99.3 Improving leukocytosis Potassium 2.7 AST/ALT stable Liver abscess cultures pending.  GNR's and GPC in pairs on Gram stain in one of the cultures.  Klebsiella pneumonia in the second culture. Blood cultures no growth to date Patient with facial twitching and seizure-like activity overnight.  Neurology consulted  Patient currently denies any pain, following simple commands.  He remains lethargic.  His wife is at the bedside.  Review of Systems  Constitutional: Negative for chills and fever.  Respiratory: Negative.   Cardiovascular: Negative.   Gastrointestinal: Negative for abdominal pain.  All other systems reviewed and are negative.     OBJECTIVE:   Blood pressure (!) 123/57, pulse 67, temperature 97.8 F (  36.6 C), temperature source Axillary, resp. rate 17, height 5\' 10"  (1.778 m), weight 79 kg, SpO2 98 %. Body mass index is 24.99 kg/m.  Physical Exam Constitutional:      Comments: Chronically ill appearing, fatigued, NAD  HENT:     Head: Normocephalic and atraumatic.  Eyes:     Extraocular Movements:  Extraocular movements intact.     Conjunctiva/sclera: Conjunctivae normal.  Cardiovascular:     Rate and Rhythm: Normal rate and regular rhythm.  Pulmonary:     Effort: Pulmonary effort is normal.     Breath sounds: Normal breath sounds.  Abdominal:     Palpations: Abdomen is soft.     Tenderness: There is no abdominal tenderness.     Comments: IR drains x 2  Neurological:     General: No focal deficit present.     Comments: Following simple commands       Lab Results: Lab Results  Component Value Date   WBC 11.7 (H) 11/08/2020   HGB 9.9 (L) 11/08/2020   HCT 31.1 (L) 11/08/2020   MCV 93.4 11/08/2020   PLT 410 (H) 11/08/2020    Lab Results  Component Value Date   NA 136 11/08/2020   K 2.7 (LL) 11/08/2020   CO2 24 11/08/2020   GLUCOSE 79 11/08/2020   BUN 10 11/08/2020   CREATININE 0.45 (L) 11/08/2020   CALCIUM 7.8 (L) 11/08/2020   GFRNONAA >60 11/08/2020   GFRAA >60 04/14/2020    Lab Results  Component Value Date   ALT 40 11/08/2020   AST 50 (H) 11/08/2020   ALKPHOS 258 (H) 11/08/2020   BILITOT 0.7 11/08/2020       Component Value Date/Time   CRP 19.4 (H) 11/06/2020 0337    No results found for: ESRSEDRATE   I have reviewed the micro and lab results in Epic.  Imaging: CT HEAD WO CONTRAST  Result Date: 11/07/2020 CLINICAL DATA:  Subdural hematoma EXAM: CT HEAD WITHOUT CONTRAST TECHNIQUE: Contiguous axial images were obtained from the base of the skull through the vertex without intravenous contrast. COMPARISON:  Brain MRI same day FINDINGS: Brain: Left holohemispheric subdural collection is diffusely hypodense and measures 10 mm in thickness. Mass effect on the left hemisphere with only trace midline shift. No hydrocephalus. No intraparenchymal hemorrhage. Vascular: No hyperdense vessel or unexpected calcification. Skull: Normal. Negative for fracture or focal lesion. Sinuses/Orbits: No acute finding. Other: None. IMPRESSION: Left holohemispheric subdural  collection measuring 10 mm in thickness with mild mass effect on the left hemisphere and only trace midline shift. This is most consistent with a late subacute or chronic subdural hematoma. Electronically Signed   By: Ulyses Jarred M.D.   On: 11/07/2020 21:58   MR BRAIN WO CONTRAST  Addendum Date: 11/07/2020   ADDENDUM REPORT: 11/07/2020 20:54 ADDENDUM: Critical Value/emergent results were called by telephone at the time of interpretation on 11/07/2020 at 8:54 pm to provider Lovey Newcomer , who verbally acknowledged these results. Electronically Signed   By: Ulyses Jarred M.D.   On: 11/07/2020 20:54   Result Date: 11/07/2020 CLINICAL DATA:  Altered mental status EXAM: MRI HEAD WITHOUT CONTRAST TECHNIQUE: Multiplanar, multiecho pulse sequences of the brain and surrounding structures were obtained without intravenous contrast. COMPARISON:  None. FINDINGS: Brain: Left holo hemispheric extra-axial collection, likely hematoma, measuring 10 mm in thickness. Mild mass effect on the left hemisphere with minimal midline shift. There is some subarachnoid blood over the left frontal lobe. Areas of abnormal diffusion restriction in the left  temporal and parietal lobes. No intraparenchymal hemorrhage. There is multifocal hyperintense T2-weighted signal within the white matter. Generalized volume loss without a clear lobar predilection. The midline structures are normal. Vascular: Major flow voids are preserved. Skull and upper cervical spine: Normal calvarium and skull base. Visualized upper cervical spine and soft tissues are normal. Sinuses/Orbits:No paranasal sinus fluid levels or advanced mucosal thickening. No mastoid or middle ear effusion. Normal orbits. IMPRESSION: 1. Age indeterminate, mixed left subarachnoid and subdural hematoma measuring 10 mm in thickness. Head CT recommended for better temporal characterization. 2. Areas of suspected acute ischemia in the left temporal and parietal lobes, though  diffusion-weighted imaging is complicated by the adjacent blood products and their magnetic susceptibility effects. Electronically Signed: By: Ulyses Jarred M.D. On: 11/07/2020 20:33   DG CHEST PORT 1 VIEW  Result Date: 11/08/2020 CLINICAL DATA:  Dyspnea EXAM: PORTABLE CHEST 1 VIEW COMPARISON:  11/07/2020 FINDINGS: Mild right basilar atelectasis or infiltrate again noted, better appreciated on prior CT examination of 11/06/2020. Mild right-sided volume loss is unchanged. No pneumothorax or pleural effusion. Cardiac size within normal limits. Pulmonary vascularity is normal. Pigtail catheter noted overlying the right upper quadrant. IMPRESSION: Stable right-sided volume loss and mild right basilar atelectasis or infiltrate. Electronically Signed   By: Fidela Salisbury MD   On: 11/08/2020 06:57   DG CHEST PORT 1 VIEW  Result Date: 11/07/2020 CLINICAL DATA:  Shortness of breath EXAM: PORTABLE CHEST 1 VIEW COMPARISON:  November 05, 2020 chest radiograph; chest CT November 06, 2020 FINDINGS: There is airspace opacity in each lung base. Lungs elsewhere clear. Heart size and pulmonary vascularity normal. No adenopathy. There is lower thoracic dextroscoliosis. There is postoperative change in the lower cervical spine. IMPRESSION: Patchy airspace opacity in the lung bases, similar to 1 day prior but increased compared to 2 days prior. Stable cardiac silhouette. No adenopathy evident. Electronically Signed   By: Lowella Grip III M.D.   On: 11/07/2020 07:58   CT IMAGE GUIDED DRAINAGE BY PERCUTANEOUS CATHETER  Result Date: 11/08/2020 INDICATION: 65 year old male with a history hepatic abscess referred for drainage EXAM: CT GUIDED DRAINAGE OF HEPATIC ABSCESS MEDICATIONS: The patient is currently admitted to the hospital and receiving intravenous antibiotics. The antibiotics were administered within an appropriate time frame prior to the initiation of the procedure. ANESTHESIA/SEDATION: 0 mg IV Versed 25 mcg IV Fentanyl  Moderate Sedation Time:  0 The patient was continuously monitored during the procedure by the interventional radiology nurse under my direct supervision. COMPLICATIONS: None TECHNIQUE: Informed written consent was obtained from the patient after a thorough discussion of the procedural risks, benefits and alternatives. All questions were addressed. Maximal Sterile Barrier Technique was utilized including caps, mask, sterile gowns, sterile gloves, sterile drape, hand hygiene and skin antiseptic. A timeout was performed prior to the initiation of the procedure. PROCEDURE: CT of the liver was performed with the patient in right anterior oblique position. The right upper quadrant was prepped with chlorhexidine in a sterile fashion, and a sterile drape was applied covering the operative field. A sterile gown and sterile gloves were used for the procedure. Local anesthesia was provided with 1% Lidocaine. Once we determine appropriate angle of approach for drainage, 1% lidocaine was used for local anesthesia. Using CT guidance, 2 separate 18 gauge trocar needles were advanced intercostal location towards the posterior right hepatic abscess. Once we confirmed needle tip position, modified Seldinger technique was used to place 2 separate 10 French pigtail drainage catheters. Aspiration of purulence fluid at  both location confirmed adequate positioning. Both drains were sutured in position and attached to bulb suction. Final CT was acquired. Patient tolerated procedure well and remained hemodynamically stable throughout. No complications were encountered and no significant blood loss. FINDINGS: CT confirms 2 hypoechoic fluid collection in the posterior right liver. After CT-guided drainage, pigtail catheters remain adequately position with collapse of both cavities. Sample was sent from both sites. IMPRESSION: Status post CT-guided drainage of 2 separate right-sided hepatic abscess. Signed, Dulcy Fanny. Dellia Nims, RPVI [facility] Radiology Electronically Signed   By: Corrie Mckusick D.O.   On: 11/08/2020 08:21   ECHOCARDIOGRAM LIMITED  Result Date: 11/08/2020    ECHOCARDIOGRAM REPORT   Patient Name:   Colton Bush Date of Exam: 11/06/2020 Medical Rec #:  [id number]      Height:       70.0 in Accession #:    [id number]     Weight:       174.2 lb Date of Birth:  Nov 28, 1955      BSA:          1.968 m Patient Age:    57 years       BP:           118/71 mmHg Patient Gender: M              HR:           108 bpm. Exam Location:  Inpatient Procedure: Limited Echo, Cardiac Doppler and Limited Color Doppler Indications:    Bacteremia  History:        Patient has prior history of Echocardiogram examinations, most                 recent 10/22/2020. Sepsis; Risk Factors:Hypertension, Diabetes                 and Dyslipidemia.  Sonographer:    Johny Chess Referring Phys: [place]  1. Limited echo, poor endocardial definition. Defininty contrast not used.  2. Left ventricular ejection fraction, by estimation, is 60 to 65%. The left ventricle has normal function. Left ventricular endocardial border not optimally defined to evaluate regional wall motion. There is mild left ventricular hypertrophy. Indeterminate diastolic filling due to E-A fusion.  3. Right ventricular systolic function is hyperdynamic. The right ventricular size is normal.  4. The mitral valve is abnormal. No evidence of mitral valve regurgitation.  5. The aortic valve is tricuspid. Aortic valve regurgitation is not visualized. Comparison(s): Changes from prior study are noted. 10/02/2020: LVEF 55-60%. Conclusion(s)/Recommendation(s): No evidence of valvular vegetations on this transthoracic echocardiogram, however, visualization of the valves was quite limited. Would recommend a transesophageal echocardiogram to exclude infective endocarditis if clinically indicated. FINDINGS  Left Ventricle: Left  ventricular ejection fraction, by estimation, is 60 to 65%. The left ventricle has normal function. Left ventricular endocardial border not optimally defined to evaluate regional wall motion. The left ventricular internal cavity size was normal in size. There is mild left ventricular hypertrophy. Indeterminate diastolic filling due to E-A fusion. Right Ventricle: The right ventricular size is normal. No increase in right ventricular wall thickness. Right ventricular systolic function is hyperdynamic. Left Atrium: Left atrial size was not well visualized. Right Atrium: Right atrial size was not well visualized. Pericardium: There is no evidence of pericardial effusion. Mitral Valve: The mitral valve is abnormal. There is mild thickening of the mitral valve leaflet(s). No evidence of mitral valve regurgitation. Tricuspid Valve:  The tricuspid valve is not well visualized. Tricuspid valve regurgitation is not demonstrated. Aortic Valve: The aortic valve is tricuspid. Aortic valve regurgitation is not visualized. Pulmonic Valve: The pulmonic valve was not well visualized. Pulmonic valve regurgitation is not visualized. Aorta: The aortic arch was not well visualized and the ascending aorta was not well visualized. Venous: The inferior vena cava was not well visualized. IAS/Shunts: No atrial level shunt detected by color flow Doppler. Lyman Bishop MD Electronically signed by Lyman Bishop MD Signature Date/Time: 11/08/2020/10:13:44 AM    Final      Imaging independently reviewed in Epic.    [facility] [phone] pager 11/08/2020, 11:19 AM  I spent greater than 35 minutes with the patient including greater than 50% of time in face to face counsel of the patient and in coordination of their care.

## 2020-11-08 NOTE — Progress Notes (Signed)
EEG complete - results pending 

## 2020-11-08 NOTE — Progress Notes (Signed)
LTM EEG hooked up and running - no initial skin breakdown - push button tested - neuro notified. [facility] monitored 

## 2020-11-08 NOTE — Progress Notes (Signed)
Modified Barium Swallow Progress Note  Patient Details  Name: Colton Bush MRN: [id number] Date of Birth: 1955-08-30  Today's Date: 11/08/2020  Modified Barium Swallow completed.  Full report located under Chart Review in the Imaging Section.  Brief recommendations include the following:  Clinical Impression  Patient presents with a mild-mod oropharyngeal dysphagia with likely impact from current level of cognitive delays and decreased overall alertness. During oral phase, patient exhibited delays in anterior to posterior transit of thin liquids and puree solids boluses and with barium tablet in puree, exhibited mild premature spillage of puree into vallecular sinus likely due to decreased bolus cohesion. trace to mild oral residuals post initial swallow which eventually cleared with uncued subsequent swallows. During pharyngeal phase of swallow, patient exhibited min vallecular residuals with puree solids, trace to min vallecular, pyriform and posterior pharynx residuals with thin liquids. Residuals were observed to fully clear pharynx during course of the study. Flash penetration was observed with thin liquids via straw sips and cup sips, however penetrate remained above vocal cords and fully cleared laryngeal vestibule. Barium tablet transited throught pharynx without difficulty. Slight narrowing of esophagus observed at level of approximately C4-6 where he has cervical hardware from h/o surgery. This narrowing did not result in significant stasis of boluses or significantly impact pharyngeal and upper esophageal transit. SLP recommending to initiate full liquids diet and with plan to work on upgraded solids trials at bedside.   Swallow Evaluation Recommendations       SLP Diet Recommendations: Other (Comment);Thin liquid (full liquids)   Liquid Administration via: Cup;Straw   Medication Administration: Whole meds with puree   Supervision: Patient able to self feed;Full supervision/cueing  for compensatory strategies;Staff to assist with self feeding   Compensations: Minimize environmental distractions;Slow rate;Small sips/bites   Postural Changes: Seated upright at 90 degrees   Oral Care Recommendations: Oral care BID;Staff/trained caregiver to provide oral care        Sonia Baller, MA, CCC-SLP Speech Therapy

## 2020-11-09 ENCOUNTER — Inpatient Hospital Stay (HOSPITAL_COMMUNITY): Payer: Medicare Other | Admitting: Certified Registered"

## 2020-11-09 ENCOUNTER — Encounter (HOSPITAL_COMMUNITY): Payer: Self-pay | Admitting: Internal Medicine

## 2020-11-09 ENCOUNTER — Ambulatory Visit: Payer: Medicare Other | Admitting: Radiation Oncology

## 2020-11-09 ENCOUNTER — Encounter (HOSPITAL_COMMUNITY)
Admission: EM | Disposition: A | Discharge status: Rehabilitation Facility | Payer: Self-pay | Source: Home / Self Care | Attending: Internal Medicine

## 2020-11-09 DIAGNOSIS — Z9181 History of falling: Secondary | ICD-10-CM

## 2020-11-09 DIAGNOSIS — S065XAA Traumatic subdural hemorrhage with loss of consciousness status unknown, initial encounter: Secondary | ICD-10-CM

## 2020-11-09 DIAGNOSIS — B954 Other streptococcus as the cause of diseases classified elsewhere: Secondary | ICD-10-CM | POA: Diagnosis not present

## 2020-11-09 DIAGNOSIS — L0291 Cutaneous abscess, unspecified: Secondary | ICD-10-CM

## 2020-11-09 DIAGNOSIS — C250 Malignant neoplasm of head of pancreas: Secondary | ICD-10-CM | POA: Diagnosis not present

## 2020-11-09 DIAGNOSIS — S065X9A Traumatic subdural hemorrhage with loss of consciousness of unspecified duration, initial encounter: Secondary | ICD-10-CM

## 2020-11-09 DIAGNOSIS — K750 Abscess of liver: Secondary | ICD-10-CM | POA: Diagnosis not present

## 2020-11-09 DIAGNOSIS — J869 Pyothorax without fistula: Secondary | ICD-10-CM

## 2020-11-09 DIAGNOSIS — A419 Sepsis, unspecified organism: Secondary | ICD-10-CM | POA: Diagnosis not present

## 2020-11-09 HISTORY — PX: BURR HOLE: SHX908

## 2020-11-09 LAB — GLUCOSE, CAPILLARY
Glucose-Capillary: 106 mg/dL — ABNORMAL HIGH (ref 70–99)
Glucose-Capillary: 110 mg/dL — ABNORMAL HIGH (ref 70–99)
Glucose-Capillary: 64 mg/dL — ABNORMAL LOW (ref 70–99)
Glucose-Capillary: 95 mg/dL (ref 70–99)
Glucose-Capillary: 103 mg/dL — ABNORMAL HIGH (ref 70–99)
Glucose-Capillary: 127 mg/dL — ABNORMAL HIGH (ref 70–99)
Glucose-Capillary: 207 mg/dL — ABNORMAL HIGH (ref 70–99)
Glucose-Capillary: 327 mg/dL — ABNORMAL HIGH (ref 70–99)

## 2020-11-09 LAB — POCT I-STAT 7, (LYTES, BLD GAS, ICA,H+H)
Acid-Base Excess: 7 mmol/L — ABNORMAL HIGH (ref 0.0–2.0)
Bicarbonate: 30.8 mmol/L — ABNORMAL HIGH (ref 20.0–28.0)
Calcium, Ion: 1.13 mmol/L — ABNORMAL LOW (ref 1.15–1.40)
HCT: 30 % — ABNORMAL LOW (ref 39.0–52.0)
Hemoglobin: 10.2 g/dL — ABNORMAL LOW (ref 13.0–17.0)
O2 Saturation: 99 %
Potassium: 3 mmol/L — ABNORMAL LOW (ref 3.5–5.1)
Sodium: 140 mmol/L (ref 135–145)
TCO2: 32 mmol/L (ref 22–32)
pCO2 arterial: 41.4 mmHg (ref 32.0–48.0)
pH, Arterial: 7.479 — ABNORMAL HIGH (ref 7.350–7.450)
pO2, Arterial: 122 mmHg — ABNORMAL HIGH (ref 83.0–108.0)

## 2020-11-09 LAB — COMPREHENSIVE METABOLIC PANEL
ALT: 31 U/L (ref 0–44)
AST: 27 U/L (ref 15–41)
Albumin: 1.7 g/dL — ABNORMAL LOW (ref 3.5–5.0)
Alkaline Phosphatase: 234 U/L — ABNORMAL HIGH (ref 38–126)
Anion gap: 6 (ref 5–15)
BUN: 5 mg/dL — ABNORMAL LOW (ref 8–23)
CO2: 27 mmol/L (ref 22–32)
Calcium: 7.5 mg/dL — ABNORMAL LOW (ref 8.9–10.3)
Chloride: 103 mmol/L (ref 98–111)
Creatinine, Ser: 0.44 mg/dL — ABNORMAL LOW (ref 0.61–1.24)
GFR, Estimated: >60 mL/min (ref >60)
Glucose, Bld: 140 mg/dL — ABNORMAL HIGH (ref 70–99)
Potassium: 3.2 mmol/L — ABNORMAL LOW (ref 3.5–5.1)
Sodium: 136 mmol/L (ref 135–145)
Total Bilirubin: 0.6 mg/dL (ref 0.3–1.2)
Total Protein: 5.6 g/dL — ABNORMAL LOW (ref 6.5–8.1)

## 2020-11-09 LAB — FOLATE: Folate: 15.4 ng/mL (ref >5.9)

## 2020-11-09 LAB — MAGNESIUM
Magnesium: 1.8 mg/dL (ref 1.7–2.4)
Magnesium: 1.9 mg/dL (ref 1.7–2.4)

## 2020-11-09 LAB — CBC WITH DIFFERENTIAL/PLATELET
Abs Immature Granulocytes: 0.04 10*3/uL (ref 0.00–0.07)
Basophils Absolute: 0 10*3/uL (ref 0.0–0.1)
Basophils Relative: 0 %
Eosinophils Absolute: 0.1 10*3/uL (ref 0.0–0.5)
Eosinophils Relative: 1 %
HCT: 29.4 % — ABNORMAL LOW (ref 39.0–52.0)
Hemoglobin: 9.6 g/dL — ABNORMAL LOW (ref 13.0–17.0)
Immature Granulocytes: 0 %
Lymphocytes Relative: 10 %
Lymphs Abs: 1 10*3/uL (ref 0.7–4.0)
MCH: 29.9 pg (ref 26.0–34.0)
MCHC: 32.7 g/dL (ref 30.0–36.0)
MCV: 91.6 fL (ref 80.0–100.0)
Monocytes Absolute: 0.6 10*3/uL (ref 0.1–1.0)
Monocytes Relative: 6 %
Neutro Abs: 8.5 10*3/uL — ABNORMAL HIGH (ref 1.7–7.7)
Neutrophils Relative %: 83 %
Platelets: 386 10*3/uL (ref 150–400)
RBC: 3.21 MIL/uL — ABNORMAL LOW (ref 4.22–5.81)
RDW: 14.4 % (ref 11.5–15.5)
WBC: 10.3 10*3/uL (ref 4.0–10.5)
nRBC: 0 % (ref 0.0–0.2)

## 2020-11-09 LAB — BASIC METABOLIC PANEL
Anion gap: 8 (ref 5–15)
BUN: <5 mg/dL — ABNORMAL LOW (ref 8–23)
CO2: 28 mmol/L (ref 22–32)
Calcium: 7.7 mg/dL — ABNORMAL LOW (ref 8.9–10.3)
Chloride: 101 mmol/L (ref 98–111)
Creatinine, Ser: 0.42 mg/dL — ABNORMAL LOW (ref 0.61–1.24)
GFR, Estimated: >60 mL/min (ref >60)
Glucose, Bld: 179 mg/dL — ABNORMAL HIGH (ref 70–99)
Potassium: 3 mmol/L — ABNORMAL LOW (ref 3.5–5.1)
Sodium: 137 mmol/L (ref 135–145)

## 2020-11-09 LAB — IRON AND TIBC
Iron: 21 ug/dL — ABNORMAL LOW (ref 45–182)
Saturation Ratios: 14 % — ABNORMAL LOW (ref 17.9–39.5)
TIBC: 154 ug/dL — ABNORMAL LOW (ref 250–450)
UIBC: 133 ug/dL

## 2020-11-09 LAB — RETICULOCYTES
Immature Retic Fract: 7.8 % (ref 2.3–15.9)
RBC.: 3.18 MIL/uL — ABNORMAL LOW (ref 4.22–5.81)
Retic Count, Absolute: 34 10*3/uL (ref 19.0–186.0)
Retic Ct Pct: 1.1 % (ref 0.4–3.1)

## 2020-11-09 LAB — PHOSPHORUS: Phosphorus: 2.4 mg/dL — ABNORMAL LOW (ref 2.5–4.6)

## 2020-11-09 LAB — FERRITIN: Ferritin: 354 ng/mL — ABNORMAL HIGH (ref 24–336)

## 2020-11-09 LAB — VITAMIN B12: Vitamin B-12: 464 pg/mL (ref 180–914)

## 2020-11-09 SURGERY — CREATION, CRANIAL BURR HOLE
Anesthesia: General | Laterality: Left

## 2020-11-09 MED ORDER — FENTANYL CITRATE (PF) 100 MCG/2ML IJ SOLN: 25.0000 ug | INTRAMUSCULAR | Status: DC | PRN

## 2020-11-09 MED ORDER — LIDOCAINE-EPINEPHRINE 1 %-1:100000 IJ SOLN
INTRAMUSCULAR | Status: DC | PRN
  Administered 2020-11-09: 5 mL

## 2020-11-09 MED ORDER — ONDANSETRON HCL 4 MG/2ML IJ SOLN
INTRAMUSCULAR | Status: DC | PRN
  Administered 2020-11-09: 4 mg via INTRAVENOUS

## 2020-11-09 MED ORDER — SODIUM CHLORIDE 0.9 % IV SOLN: INTRAVENOUS | Status: DC | PRN

## 2020-11-09 MED ORDER — ACETAMINOPHEN 160 MG/5ML PO SOLN: 325.0000 mg | Freq: Once | ORAL | Status: DC | PRN

## 2020-11-09 MED ORDER — POTASSIUM CHLORIDE 10 MEQ/100ML IV SOLN
INTRAVENOUS | Status: DC | PRN
  Administered 2020-11-09: 10 meq via INTRAVENOUS

## 2020-11-09 MED ORDER — BACITRACIN ZINC 500 UNIT/GM EX OINT
TOPICAL_OINTMENT | CUTANEOUS | Status: DC | PRN
  Administered 2020-11-09: 1 via TOPICAL

## 2020-11-09 MED ORDER — FENTANYL CITRATE (PF) 250 MCG/5ML IJ SOLN
INTRAMUSCULAR | Status: AC
  Filled 2020-11-09: qty 5

## 2020-11-09 MED ORDER — PROPOFOL 10 MG/ML IV BOLUS
INTRAVENOUS | Status: AC
  Filled 2020-11-09: qty 20

## 2020-11-09 MED ORDER — LACTATED RINGERS IV SOLN: INTRAVENOUS | Status: DC

## 2020-11-09 MED ORDER — FENTANYL CITRATE (PF) 250 MCG/5ML IJ SOLN
INTRAMUSCULAR | Status: DC | PRN
  Administered 2020-11-09 (×2): 50 ug via INTRAVENOUS

## 2020-11-09 MED ORDER — INSULIN DETEMIR 100 UNIT/ML ~~LOC~~ SOLN
10.0000 [IU] | Freq: Two times a day (BID) | SUBCUTANEOUS | Status: DC
  Filled 2020-11-09: qty 0.1

## 2020-11-09 MED ORDER — ACETAMINOPHEN 325 MG PO TABS: 325.0000 mg | ORAL_TABLET | Freq: Once | ORAL | Status: DC | PRN

## 2020-11-09 MED ORDER — SODIUM CHLORIDE 0.9 % IV SOLN: INTRAVENOUS | Status: DC

## 2020-11-09 MED ORDER — PHENYLEPHRINE 40 MCG/ML (10ML) SYRINGE FOR IV PUSH (FOR BLOOD PRESSURE SUPPORT)
PREFILLED_SYRINGE | INTRAVENOUS | Status: DC | PRN
  Administered 2020-11-09: 80 ug via INTRAVENOUS

## 2020-11-09 MED ORDER — ROCURONIUM BROMIDE 10 MG/ML (PF) SYRINGE
PREFILLED_SYRINGE | INTRAVENOUS | Status: DC | PRN
  Administered 2020-11-09: 50 mg via INTRAVENOUS

## 2020-11-09 MED ORDER — DEXTROSE 50 % IV SOLN
12.5000 g | Freq: Once | INTRAVENOUS | Status: AC
  Administered 2020-11-09: 12.5 g via INTRAVENOUS
  Filled 2020-11-09: qty 50

## 2020-11-09 MED ORDER — LIDOCAINE-EPINEPHRINE 1 %-1:100000 IJ SOLN
INTRAMUSCULAR | Status: AC
  Filled 2020-11-09: qty 1

## 2020-11-09 MED ORDER — ORAL CARE MOUTH RINSE: 15.0000 mL | Freq: Once | OROMUCOSAL | Status: AC

## 2020-11-09 MED ORDER — SODIUM CHLORIDE 0.9 % IV SOLN
2.0000 g | Freq: Two times a day (BID) | INTRAVENOUS | Status: DC
  Administered 2020-11-09 – 2020-11-12 (×7): 2 g via INTRAVENOUS
  Filled 2020-11-09 (×2): qty 2
  Filled 2020-11-09 (×3): qty 20
  Filled 2020-11-09 (×2): qty 2
  Filled 2020-11-09 (×2): qty 20

## 2020-11-09 MED ORDER — INSULIN DETEMIR 100 UNIT/ML ~~LOC~~ SOLN
10.0000 [IU] | Freq: Every day | SUBCUTANEOUS | Status: DC
  Administered 2020-11-10: 10 [IU] via SUBCUTANEOUS
  Filled 2020-11-09: qty 0.1

## 2020-11-09 MED ORDER — MEPERIDINE HCL 25 MG/ML IJ SOLN: 6.2500 mg | INTRAMUSCULAR | Status: DC | PRN

## 2020-11-09 MED ORDER — DEXAMETHASONE SODIUM PHOSPHATE 10 MG/ML IJ SOLN
INTRAMUSCULAR | Status: DC | PRN
  Administered 2020-11-09: 10 mg via INTRAVENOUS

## 2020-11-09 MED ORDER — CHLORHEXIDINE GLUCONATE 0.12 % MT SOLN: 15.0000 mL | Freq: Once | OROMUCOSAL | Status: AC

## 2020-11-09 MED ORDER — METRONIDAZOLE 500 MG PO TABS
500.0000 mg | ORAL_TABLET | Freq: Three times a day (TID) | ORAL | Status: DC
  Administered 2020-11-09 – 2020-11-12 (×9): 500 mg via ORAL
  Filled 2020-11-09 (×12): qty 1

## 2020-11-09 MED ORDER — ACETAMINOPHEN 10 MG/ML IV SOLN: 1000.0000 mg | Freq: Once | INTRAVENOUS | Status: DC | PRN

## 2020-11-09 MED ORDER — THROMBIN 5000 UNITS EX SOLR
OROMUCOSAL | Status: DC | PRN
  Administered 2020-11-09: 5 mL via TOPICAL

## 2020-11-09 MED ORDER — LIDOCAINE 2% (20 MG/ML) 5 ML SYRINGE
INTRAMUSCULAR | Status: DC | PRN
  Administered 2020-11-09: 60 mg via INTRAVENOUS

## 2020-11-09 MED ORDER — CHLORHEXIDINE GLUCONATE 0.12 % MT SOLN
OROMUCOSAL | Status: AC
  Administered 2020-11-09: 15 mL via OROMUCOSAL
  Filled 2020-11-09: qty 15

## 2020-11-09 MED ORDER — BACITRACIN ZINC 500 UNIT/GM EX OINT
TOPICAL_OINTMENT | CUTANEOUS | Status: AC
  Filled 2020-11-09: qty 28.35

## 2020-11-09 MED ORDER — 0.9 % SODIUM CHLORIDE (POUR BTL) OPTIME
TOPICAL | Status: DC | PRN
  Administered 2020-11-09 (×3): 1000 mL

## 2020-11-09 MED ORDER — POTASSIUM CHLORIDE 10 MEQ/100ML IV SOLN
10.0000 meq | INTRAVENOUS | Status: DC
  Administered 2020-11-09: 10 meq via INTRAVENOUS
  Filled 2020-11-09 (×2): qty 100

## 2020-11-09 MED ORDER — THROMBIN 5000 UNITS EX SOLR
CUTANEOUS | Status: AC
  Filled 2020-11-09: qty 5000

## 2020-11-09 MED ORDER — PROPOFOL 10 MG/ML IV BOLUS
INTRAVENOUS | Status: DC | PRN
  Administered 2020-11-09: 120 mg via INTRAVENOUS

## 2020-11-09 MED ORDER — SUGAMMADEX SODIUM 200 MG/2ML IV SOLN
INTRAVENOUS | Status: DC | PRN
  Administered 2020-11-09: 400 mg via INTRAVENOUS

## 2020-11-09 SURGICAL SUPPLY — 61 items
BLADE CLIPPER SURG (BLADE) ×2 IMPLANT
BNDG GAUZE ELAST 4 BULKY (GAUZE/BANDAGES/DRESSINGS) IMPLANT
BUR ACORN 9.0 PRECISION (BURR) ×2 IMPLANT
BUR MATCHSTICK NEURO 3.0 LAGG (BURR) IMPLANT
BUR SPIRAL ROUTER 2.3 (BUR) IMPLANT
CANISTER SUCT 3000ML PPV (MISCELLANEOUS) ×2 IMPLANT
CLIP VESOCCLUDE MED 6/CT (CLIP) IMPLANT
COVER WAND RF STERILE (DRAPES) ×2 IMPLANT
DRAPE NEUROLOGICAL W/INCISE (DRAPES) ×2 IMPLANT
DRAPE SHEET LG 3/4 BI-LAMINATE (DRAPES) ×2 IMPLANT
DRAPE SURG 17X23 STRL (DRAPES) IMPLANT
DRAPE WARM FLUID 44X44 (DRAPES) ×2 IMPLANT
DURAPREP 6ML APPLICATOR 50/CS (WOUND CARE) ×2 IMPLANT
ELECT REM PT RETURN 9FT ADLT (ELECTROSURGICAL) ×2
ELECTRODE REM PT RTRN 9FT ADLT (ELECTROSURGICAL) ×1 IMPLANT
EVACUATOR 1/8 PVC DRAIN (DRAIN) IMPLANT
EVACUATOR SILICONE 100CC (DRAIN) IMPLANT
GAUZE 4X4 16PLY RFD (DISPOSABLE) IMPLANT
GAUZE SPONGE 4X4 12PLY STRL (GAUZE/BANDAGES/DRESSINGS) ×2 IMPLANT
GLOVE BIOGEL PI IND STRL 7.5 (GLOVE) ×2 IMPLANT
GLOVE BIOGEL PI INDICATOR 7.5 (GLOVE) ×2
GLOVE ECLIPSE 7.5 STRL STRAW (GLOVE) ×4 IMPLANT
GLOVE EXAM NITRILE LRG STRL (GLOVE) IMPLANT
GLOVE EXAM NITRILE XL STR (GLOVE) IMPLANT
GLOVE EXAM NITRILE XS STR PU (GLOVE) IMPLANT
GOWN STRL REUS W/ TWL LRG LVL3 (GOWN DISPOSABLE) ×2 IMPLANT
GOWN STRL REUS W/ TWL XL LVL3 (GOWN DISPOSABLE) IMPLANT
GOWN STRL REUS W/TWL 2XL LVL3 (GOWN DISPOSABLE) IMPLANT
GOWN STRL REUS W/TWL LRG LVL3 (GOWN DISPOSABLE) ×4
GOWN STRL REUS W/TWL XL LVL3 (GOWN DISPOSABLE) ×4
HEMOSTAT POWDER KIT SURGIFOAM (HEMOSTASIS) ×2 IMPLANT
HEMOSTAT SURGICEL 2X14 (HEMOSTASIS) IMPLANT
HOOK DURA 1/2IN (MISCELLANEOUS) ×1 IMPLANT
KIT BASIN OR (CUSTOM PROCEDURE TRAY) ×2 IMPLANT
KIT TURNOVER KIT B (KITS) ×2 IMPLANT
NEEDLE HYPO 22GX1.5 SAFETY (NEEDLE) ×2 IMPLANT
NS IRRIG 1000ML POUR BTL (IV SOLUTION) ×2 IMPLANT
PACK CRANIOTOMY CUSTOM (CUSTOM PROCEDURE TRAY) ×2 IMPLANT
PATTIES SURGICAL .5 X.5 (GAUZE/BANDAGES/DRESSINGS) IMPLANT
PATTIES SURGICAL .5 X3 (DISPOSABLE) IMPLANT
PATTIES SURGICAL 1X1 (DISPOSABLE) IMPLANT
SPONGE NEURO XRAY DETECT 1X3 (DISPOSABLE) IMPLANT
SPONGE SURGIFOAM ABS GEL SZ50 (HEMOSTASIS) ×2 IMPLANT
STAPLER VISISTAT 35W (STAPLE) ×2 IMPLANT
STOCKINETTE 6  STRL (DRAPES)
STOCKINETTE 6 STRL (DRAPES) IMPLANT
SUT ETHILON 3 0 FSL (SUTURE) IMPLANT
SUT ETHILON 3 0 PS 1 (SUTURE) IMPLANT
SUT MNCRL AB 3-0 PS2 18 (SUTURE) ×2 IMPLANT
SUT NURALON 4 0 TR CR/8 (SUTURE) ×2 IMPLANT
SUT STEEL 0 (SUTURE)
SUT STEEL 0 18XMFL TIE 17 (SUTURE) IMPLANT
SUT VIC AB 0 CT1 18XCR BRD8 (SUTURE) ×1 IMPLANT
SUT VIC AB 0 CT1 8-18 (SUTURE) ×2
SUT VIC AB 2-0 CP2 18 (SUTURE) ×1 IMPLANT
TOWEL GREEN STERILE (TOWEL DISPOSABLE) ×2 IMPLANT
TOWEL GREEN STERILE FF (TOWEL DISPOSABLE) ×2 IMPLANT
TRAY FOLEY MTR SLVR 16FR STAT (SET/KITS/TRAYS/PACK) ×1 IMPLANT
TUBE CONNECTING 12X1/4 (SUCTIONS) ×2 IMPLANT
UNDERPAD 30X36 HEAVY ABSORB (UNDERPADS AND DIAPERS) ×2 IMPLANT
WATER STERILE IRR 1000ML POUR (IV SOLUTION) ×2 IMPLANT

## 2020-11-09 NOTE — Progress Notes (Signed)
Neurosurgery Service Post-operative progress note  Assessment & Plan: 65 y.o. man s/p burr hole evacuation of subdural hematoma, seen in PACU, recovering well, stable neurologic exam.  -okay to return to stepdown on 5w -hold DVT chemoprophylaxis until POD2 (4/14), hold full anticoagulation until POD5 (4/17)  Colton Bush  11/09/20 1:42 PM

## 2020-11-09 NOTE — Op Note (Signed)
PATIENT: Colton Bush  DAY OF SURGERY: 11/09/20   PRE-OPERATIVE DIAGNOSIS:  Left subdural empyema   POST-OPERATIVE DIAGNOSIS:  Left subdural hematoma   PROCEDURE:  Left burr hole for evacuation of subdural hematoma   SURGEON:  Surgeon(s) and Role:    Judith Part, MD - Primary   ANESTHESIA: ETGA   BRIEF HISTORY: This is a 65 year old man who presented with bacteremia, pneumonia, and a hepatic abscess in the setting of recurrent pancreatic adenocarcinoma. He had worsening mental status, CT and MRI were consistent with a left sided subdural hematoma versus empyema. Given his clinical appearance and the atypical imaging findings, there was concern for subdural empyema. I therefore recommended surgical evacuation to establish a diagnosis and drain the collection. This was discussed with the patient and his wife as well as risks, benefits, and alternatives and wished to proceed with surgery.   OPERATIVE DETAIL: The patient was taken to the operating room and placed on the OR table in the supine position. A formal time out was performed with two patient identifiers and confirmed the operative site. Anesthesia was induced by the anesthesia team. The operative site was marked, hair was clipped with surgical clippers, the area was then prepped and draped in a sterile fashion. A linear 3cm incision was placed in the left parietal region. After soft tissue dissection, a burr hole was created with a high speed drill. The dura was coagulated and opened with immediate return of chronic appearing subdural hematoma. It appeared to be mostly chronic blood with some membranes, no evidence of purulent material.   This was copiously irrigated until only clear fluid returned. All instrument and sponge counts were correct, the incision was then closed in layers. The patient was then returned to anesthesia for emergence. No apparent complications at the completion of the procedure.   EBL:  50mL   DRAINS:  none   SPECIMENS: None   Judith Part, MD 11/09/20 12:52 PM

## 2020-11-09 NOTE — Progress Notes (Signed)
Hypoglycemic Event  CBG: 64  Treatment: D50 25 mL (12.5 gm)  Symptoms: Sweaty  Follow-up CBG: Time: 0818 CBG Result: 110  Possible Reasons for Event: Inadequate meal intake and Medication regimen: Levemir given, patient npo  Comments/MD notified: Dr. Waldron Labs, face to face    Posey Pronto

## 2020-11-09 NOTE — Anesthesia Procedure Notes (Signed)
Arterial Line Insertion Start/End4/06/2021 12:20 PM, 11/09/2020 12:25 PM Performed by: Amadeo Garnet, CRNA, CRNA  Patient location: Pre-op. Preanesthetic checklist: patient identified, IV checked, site marked, risks and benefits discussed, surgical consent, monitors and equipment checked, pre-op evaluation, timeout performed and anesthesia consent Left, radial was placed Catheter size: 20 G Hand hygiene performed  and maximum sterile barriers used   Attempts: 1 Procedure performed without using ultrasound guided technique. Following insertion, Biopatch. Post procedure assessment: normal  Patient tolerated the procedure well with no immediate complications.

## 2020-11-09 NOTE — Progress Notes (Signed)
PT Cancellation Note  Patient Details Name: RUDI BUNYARD MRN: [id number] DOB: Aug 13, 1955   Cancelled Treatment:    Reason Eval/Treat Not Completed: (P) Patient at procedure or test/unavailable Pt is off floor for procedure. PT will follow back for treatment this afternoon as able.   Soliyana Mcchristian B. Migdalia Dk PT, DPT Acute Rehabilitation Services Pager [phone] Office [phone]    [place] 11/09/2020, 11:41 AM

## 2020-11-09 NOTE — Progress Notes (Signed)
LTM EEG discontinued - no skin breakdown at unhook.   

## 2020-11-09 NOTE — Progress Notes (Signed)
OT Cancellation Note  Patient Details Name: Colton Bush MRN: [id number] DOB: 02-Jan-1956   Cancelled Treatment:    Reason Eval/Treat Not Completed: Medical issues which prohibited therapy;Patient at procedure or test/ unavailable;Other (comment) pt in surgery for evacuation of subdural hematoma. Will hold off on OT session and check back as pt medically appropriate.   Corinne Ports K., COTA/L Acute Rehabilitation Services [phone]   [name] 11/09/2020, 2:12 PM

## 2020-11-09 NOTE — Progress Notes (Signed)
SLP Cancellation Note  Patient Details Name: Colton Bush MRN: [id number] DOB: Feb 18, 1956   Cancelled treatment:       Reason Eval/Treat Not Completed: Patient at procedure or test/unavailable- will continue efforts.  Nhung Danko L. Tivis Ringer, [place] Office number [phone] Pager [phone]    Juan Quam [place] 11/09/2020, 11:58 AM

## 2020-11-09 NOTE — Progress Notes (Signed)
Subjective: No significant changes, no seizures on EEG.   Exam: Vitals:   11/08/20 2329 11/09/20 0423  BP: 127/67 124/63  Pulse: 78 70  Resp: 20 20  Temp: 98.3 F (36.8 C) 98.2 F (36.8 C)  SpO2: 91% 92%   Gen: In bed, NAD Resp: non-labored breathing, no acute distress Abd: soft, nt  Neuro: MS: Awake, alert, He has significant dysarthria. When asked to push, he repeatedly pulls, and has some other difficulty with commands.   CN: R pupil very slightlyu larger than left, both are reactive, VFF Motor: pronator drift on the right, 4+/5, 4-/5 RLE Sensory:reports symmetric sensation, though unclear on reliability   Impression: 65 year old male with subdural fluid collection and subsequent seizures.  I have discussed with Dr. Venetia Constable of neurosurgery, there are some characteristics on MRI that make me wonder about possible empyema as opposed to chronic subdural hematoma.  He will evaluate the patient.  With no seizures on continuous EEG, my suspicion is that his aphasia is more results of either of the compressive effects of the collection versus the restricted diffusion associated with it.  Recommendations: 1) continue Keppra 1 g twice daily 2) discontinue EEG monitoring 3) appreciate neurosurgical evaluation.  Roland Rack, MD [facility] [phone]  If 7pm- 7am, please page neurology on call as listed in [place].

## 2020-11-09 NOTE — Anesthesia Postprocedure Evaluation (Signed)
Anesthesia Post Note  Patient: Colton Bush  Procedure(s) Performed: LEFT BURR HOLES FOR EVACUATION OF Subdural Hematoma (Left )     Patient location during evaluation: PACU Anesthesia Type: General Level of consciousness: awake and alert Pain management: pain level controlled Vital Signs Assessment: post-procedure vital signs reviewed and stable Respiratory status: spontaneous breathing, nonlabored ventilation, respiratory function stable and patient connected to nasal cannula oxygen Cardiovascular status: blood pressure returned to baseline and stable Postop Assessment: no apparent nausea or vomiting Anesthetic complications: no   No complications documented.  Last Vitals:  Vitals:   11/09/20 1445 11/09/20 1516  BP: (!) 113/59 121/71  Pulse: 77 86  Resp: 18 17  Temp: 36.4 C   SpO2: 95% 92%    Last Pain:  Vitals:   11/09/20 1445  TempSrc:   PainSc: 0-No pain                 Effie Berkshire

## 2020-11-09 NOTE — Consult Note (Signed)
Neurosurgery Consultation  Reason for Consult: Subdural hematoma versus empyema Referring Physician: Leonel Ramsay  CC: Subdural collection  HPI: This is a 65 y.o. man that originally presented with a right hip fracture, leading to discovery of a hepatic abscess, PNA, and bacteremia, then increasing lethargy. He has a history of pancreatic adenoCa with recurrence. He had perc drains placed for the hepatic abscess and was then transferred to our facility for further care. I was consulted for evaluation of the subdural collection. Last night he had some episodes concerning for possible seizure-like activity. The patient is awake and pleasant, but unable to give much history, the above comes from review of his EMR. He denies any new symptoms but again history is limited by neurologic decline. No known recent use of anti-platelet or anti-coagulant medications.   ROS: A 14 point ROS was performed and is negative except as noted in the HPI but limited due to AMS.  PMHx:  Past Medical History:  Diagnosis Date  . Arthritis    left hand  . Bronchitis 1977  . Cancer ([place]) 03/09/2016   pancreatic cancer, sees Dr. Cristino Martes at [facility]   . Depression    takes Cymbalta daily  . Diabetes mellitus type II    sees Dr. Chalmers Cater   . GERD (gastroesophageal reflux disease)    takes Omeprazole daily  . H/O hiatal hernia   . Hyperlipidemia    takes Zocor daily  . Hypertension    takes Amlodipine daily  . Hypoglycemia 06/18/2017  . Neck pain    C4-7 stenosis and herniated disc  . Neuromuscular disorder ([place])    hiatal hernia  . Scoliosis    slight  . Spinal cord injury, C5-C7 ([place])    c4-c7  . Stiffness of hand joint    d/t cervical issues   FamHx:  Family History  Problem Relation Age of Onset  . Heart disease Father   . Heart disease Brother 42  . Anesthesia problems Mother   . Heart disease Mother   . Dementia Mother   . Diabetes Sister   . Stroke Sister   . Colon cancer Neg Hx   . Rectal  cancer Neg Hx   . Stomach cancer Neg Hx    SocHx:  reports that he has never smoked. He has never used smokeless tobacco. He reports that he does not drink alcohol and does not use drugs.  Exam: Vital signs in last 24 hours: Temp:  [97.5 F (36.4 C)-98.8 F (37.1 C)] 98.8 F (37.1 C) (04/12 0748) Pulse Rate:  [62-90] 86 (04/12 0748) Resp:  [16-24] 18 (04/12 0748) BP: (109-142)/(46-77) 142/77 (04/12 0748) SpO2:  [90 %-100 %] 91 % (04/12 0748) Weight:  [86.3 kg] 86.3 kg (04/11 2103) General: Awake, alert, cooperative, lying in bed in NAD Head: Dolicocephalic with elongated midface and jaw, atruamatic HEENT: Neck supple Pulmonary: breathing room air comfortably, no evidence of increased work of breathing Cardiac: RRR Abdomen: +perc LUQ drains in place x2 with serous drainage in both Extremities: Warm and well perfused x4, wound care dressings / boots in place on BLE Neuro: Awake/alert and interactive, PERRL, gaze conjugate, follows commands to cues but not verbal instructions, FS Speech with decreased overall output, likely partial global aphasia (partial receptive aphasia with a likely component of output / mixed aphasia) Strength diffusely 4/5 x4 and limited by receptive aphasia   Assessment and Plan: 65 y.o. man w/ h/o pancreatic Ca and p/w progressive confusion and aphasia in the setting of recent  bacteremia / hepatic abscess / pneumonia. MRI personally reviewed, which shows left sided subdural collection with less than expected blooming of gradient signal, some scattered diffusion changes inside the collection but not uniformly restricting. Adjacent cortex has some subtle diffusion and T2 changes, posteriorly more than anteriorly. I agree with Dr. Leonel Ramsay that his history and the imaging findings are concerning for the possible presence of a subdural empyema.   -discussed w/ the pt and his wife, this could be a subdural hematoma but given the possibility of empyema, I think it  warrants drainage and cultures. Keep NPO, will take to the OR for burr hole evacuation and cultures.   Judith Part, MD 11/09/20 7:50 AM [facility]

## 2020-11-09 NOTE — Progress Notes (Signed)
PROGRESS NOTE    Colton Bush  VOZ:[id number] DOB: 24-Aug-1955 DOA: 11/05/2020 PCP: Colton Low, MD   Brief Narrative:   Patient is a 65 year old Caucasian male with a past medical history significant for but not limited to pancreatic cancer diagnosed in 2017 with recent recurrence, diabetes mellitus type 2, hypertension, hyperlipidemia, chronic diastolic CHF with an EF of 55 to 60% and grade 1 diastolic dysfunction back in March 2022, recent right hip fracture status post repair and intramedullary nail as well as other comorbidities who presented to [facility] long ED with a chief complaint of a sudden change in mental status.,  Patient was admitted with altered mental status, his work-up was significant for sepsis, DKA, sepsis related to bacteremia, with liver abscess, bilateral pneumonia, and abnormal finding and MRI suspicious for subdural hematoma, he was sent to [facility] for further evaluation by neurology and neurosurgery, finding felt to be more likely possible empyema than subdural hematoma.   Subjective:  There is no significant event overnight as discussed with staff, CBG this morning was at 68, he is on D5, seen by neurology and neurosurgery, and to go to the OR today..   Assessment & Plan:   Principal Problem:   Hepatic abscess Active Problems:   Essential hypertension   GERD without esophagitis   Mixed diabetic hyperlipidemia associated with type 2 diabetes mellitus ([place])   Adenocarcinoma of head of pancreas (HCC)   Sepsis ([place])   Uncontrolled type 2 diabetes mellitus with ketoacidosis without coma, with long-term current use of insulin (HCC)   Increased anion gap metabolic acidosis   Chronic diastolic CHF (congestive heart failure) (HCC)   BPH with urinary obstruction   Pressure ulcer of sacral region, stage 2 (HCC)   Pneumonia of both lower lobes due to infectious organism   Sepsis secondary to UTI ([place])  Severe sepsis secondary to strep anginosis  bacteremia  associated with liver abscess and bilateral lower lobe pneumonia intracranial empyema. -  present on admission -Liver abscess in the setting of strep angina gnosis bacteremia, this post IR percutaneous drain placement x2 on 4/9, culture growing Klebsiella pneumonia. -Intracranial empyema, neurology/neuro surgery consult greatly appreciated, plan to go to the OR today. -Antibiotics management per ID. On rocephin and Flagyl  Acute Toxic Encephalopathy -multifactorial, in the setting of sepsis, and intracranial empyema. -Possible seizures as well, currently having in the setting of Infection. -Possible seizures, neurology on board, continue with Keppra.   Diabetic Ketoacidosis without coma in the setting of infection as above, improved Metabolic Acidosis -On DKA protocol initially, currently transitioned to subcu insulin regimen. -She is with hypoglycemia this morning, he will be on p.o. as well, so we will lower his Levemir 10 units daily, and continue with sliding scale.  Essential Hypertension -Acceptable, continue with as needed regimen.  Mixed diabetic hyperlipidemia associated with type 2 diabetes mellitus ([place]) -Continuing home regimen of statin therapy if patient is able to tolerate oral intake as well as his omega-3  Depression -Resume Cymbalta when able to tolerate p.o.  Adenocarcinoma of head of pancreas ([place]) -Diagnosed in 2017, Status post chemotherapy and radiation at the time, Status post Whipple procedure -Now with recurrence of disease in recent months, . -  resume his Creon when tolerating po   Chronic Diastolic CHF (congestive heart failure) (HCC) -No evidence of volume overload at this time  BPH with urinary obstruction -Foley catheter was placed during last hospitalization due to outlet obstruction and remains in place today. -Foley catheter has been  exchanged during this hospitalization -Continuing home regimen of Tamsulosin 0.4 mg po Daily when tolerating  po  Pressure ulcer of sacral region, stage 2 (HCC) -Wound care consultation placed -Present on admission  GERD without Esophagitis -Continue with PPI   Hypokalemia -Repleted  Normocytic Anemia -Transfuse for hemoglobin less than 7  Hyperbilirubinemia -resolved  Hypophosphatemia -repleted  Hip fracture in the setting of fall status post intramedullary nailing - PT/OT when stable  Thrombocytosis -Due to infection, trending down  DVT prophylaxis: SCDs  Code Status: FULL CODE Family Communication: Discussed with the wife by wife. Disposition Plan: Pending further clinical improvement and will transfer to [facility] for further Neuro Workup and Evaluation   Status is: Inpatient  Remains inpatient appropriate because:Unsafe d/c plan, IV treatments appropriate due to intensity of illness or inability to take PO and Inpatient level of care appropriate due to severity of illness   Dispo: The patient is from: SNF              Anticipated d/c is to: SNF              Patient currently is not medically stable to d/c.   Difficult to place patient No  Consultants:   IR  Infectious Diseases   Neurosurgery Dr. Zada Finders  Neurology    Procedures:  Procedure: Image guided drain placement, 2 x hepatic abscess.  2 x 72F pigtail drain into right liver.  ECHOCARDIOGRAM IMPRESSIONS    1. Limited echo, poor endocardial definition. Defininty contrast not  used.  2. Left ventricular ejection fraction, by estimation, is 60 to 65%. The  left ventricle has normal function. Left ventricular endocardial border  not optimally defined to evaluate regional wall motion. There is mild left  ventricular hypertrophy.  Indeterminate diastolic filling due to E-A fusion.  3. Right ventricular systolic function is hyperdynamic. The right  ventricular size is normal.  4. The mitral valve is abnormal. No evidence of mitral valve  regurgitation.  5. The aortic valve is tricuspid. Aortic  valve regurgitation is not  visualized.   Comparison(s): Changes from prior study are noted. 10/02/2020: LVEF 55-60%.   Conclusion(s)/Recommendation(s): No evidence of valvular vegetations on  this transthoracic echocardiogram, however, visualization of the valves  was quite limited. Would recommend a transesophageal echocardiogram to  exclude infective endocarditis if  clinically indicated.   FINDINGS  Left Ventricle: Left ventricular ejection fraction, by estimation, is 60  to 65%. The left ventricle has normal function. Left ventricular  endocardial border not optimally defined to evaluate regional wall motion.  The left ventricular internal cavity  size was normal in size. There is mild left ventricular hypertrophy.  Indeterminate diastolic filling due to E-A fusion.   Right Ventricle: The right ventricular size is normal. No increase in  right ventricular wall thickness. Right ventricular systolic function is  hyperdynamic.   Left Atrium: Left atrial size was not well visualized.   Right Atrium: Right atrial size was not well visualized.   Pericardium: There is no evidence of pericardial effusion.   Mitral Valve: The mitral valve is abnormal. There is mild thickening of  the mitral valve leaflet(s). No evidence of mitral valve regurgitation.   Tricuspid Valve: The tricuspid valve is not well visualized. Tricuspid  valve regurgitation is not demonstrated.   Aortic Valve: The aortic valve is tricuspid. Aortic valve regurgitation is  not visualized.   Pulmonic Valve: The pulmonic valve was not well visualized. Pulmonic valve  regurgitation is not visualized.   Aorta: The  aortic arch was not well visualized and the ascending aorta was  not well visualized.   Venous: The inferior vena cava was not well visualized.   IAS/Shunts: No atrial level shunt detected by color flow Doppler.   MRI as above  Head CT as above   Antimicrobials:  Anti-infectives (From admission,  onward)   Start     Dose/Rate Route Frequency Ordered Stop   11/09/20 1000  cefTRIAXone (ROCEPHIN) 2 g in sodium chloride 0.9 % 100 mL IVPB        2 g 200 mL/hr over 30 Minutes Intravenous Every 12 hours 11/09/20 0828     11/09/20 0915  metroNIDAZOLE (FLAGYL) tablet 500 mg        500 mg Oral Every 8 hours 11/09/20 0828     11/07/20 2200  Ampicillin-Sulbactam (UNASYN) 3 g in sodium chloride 0.9 % 100 mL IVPB  Status:  Discontinued        3 g 200 mL/hr over 30 Minutes Intravenous Every 6 hours 11/06/20 2135 11/07/20 0943   11/07/20 1800  Ampicillin-Sulbactam (UNASYN) 3 g in sodium chloride 0.9 % 100 mL IVPB  Status:  Discontinued        3 g 200 mL/hr over 30 Minutes Intravenous Every 6 hours 11/07/20 1219 11/09/20 0828   11/07/20 1600  Ampicillin-Sulbactam (UNASYN) 3 g in sodium chloride 0.9 % 100 mL IVPB  Status:  Discontinued        3 g 200 mL/hr over 30 Minutes Intravenous Every 6 hours 11/07/20 0943 11/07/20 1219   11/07/20 1030  Ampicillin-Sulbactam (UNASYN) 3 g in sodium chloride 0.9 % 100 mL IVPB        3 g 200 mL/hr over 30 Minutes Intravenous  Once 11/07/20 0943 11/07/20 1213   11/06/20 1800  Ampicillin-Sulbactam (UNASYN) 3 g in sodium chloride 0.9 % 100 mL IVPB  Status:  Discontinued        3 g 200 mL/hr over 30 Minutes Intravenous Every 6 hours 11/06/20 1442 11/06/20 2135   11/06/20 1100  cefTRIAXone (ROCEPHIN) 2 g in sodium chloride 0.9 % 100 mL IVPB  Status:  Discontinued        2 g 200 mL/hr over 30 Minutes Intravenous Every 24 hours 11/06/20 0855 11/06/20 1435   11/06/20 0800  vancomycin (VANCOREADY) IVPB 1250 mg/250 mL  Status:  Discontinued        1,250 mg 166.7 mL/hr over 90 Minutes Intravenous Every 12 hours 11/06/20 0643 11/06/20 0854   11/06/20 0700  metroNIDAZOLE (FLAGYL) IVPB 500 mg  Status:  Discontinued        500 mg 100 mL/hr over 60 Minutes Intravenous Every 8 hours 11/06/20 0638 11/06/20 1452   11/06/20 0400  ceFEPIme (MAXIPIME) 2 g in sodium chloride 0.9 %  100 mL IVPB  Status:  Discontinued        2 g 200 mL/hr over 30 Minutes Intravenous Every 8 hours 11/05/20 2358 11/06/20 0853   11/05/20 1845  vancomycin (VANCOREADY) IVPB 1500 mg/300 mL        1,500 mg 150 mL/hr over 120 Minutes Intravenous  Once 11/05/20 1830 11/05/20 2237   11/05/20 1830  ceFEPIme (MAXIPIME) 2 g in sodium chloride 0.9 % 100 mL IVPB        2 g 200 mL/hr over 30 Minutes Intravenous  Once 11/05/20 1824 11/05/20 2103   11/05/20 1830  metroNIDAZOLE (FLAGYL) IVPB 500 mg        500 mg 100 mL/hr over 60 Minutes Intravenous  Once 11/05/20 1824 11/05/20 2140   11/05/20 1830  vancomycin (VANCOCIN) IVPB 1000 mg/200 mL premix  Status:  Discontinued        1,000 mg 200 mL/hr over 60 Minutes Intravenous  Once 11/05/20 1824 11/05/20 1830        Objective: Vitals:   11/08/20 2103 11/08/20 2329 11/09/20 0423 11/09/20 0748  BP: 119/66 127/67 124/63 (!) 142/77  Pulse: 82 78 70 86  Resp: 20 20 20 18   Temp: 98.4 F (36.9 C) 98.3 F (36.8 C) 98.2 F (36.8 C) 98.8 F (37.1 C)  TempSrc: Oral Axillary Axillary Axillary  SpO2: 90% 91% 92% 91%  Weight: 86.3 kg     Height:        Intake/Output Summary (Last 24 hours) at 11/09/2020 1000 Last data filed at 11/09/2020 0840 Gross per 24 hour  Intake 3159.46 ml  Output 2570 ml  Net 589.46 ml   Filed Weights   11/06/20 0800 11/06/20 0903 11/08/20 2103  Weight: 79 kg 79 kg 86.3 kg   Examination: Physical Exam:  Awake Alert, answering simple question, easily distracted, confused, able to follow some simple commands, with has significant dysarthria . Symmetrical Chest wall movement, Good air movement bilaterally, CTAB RRR,No Gallops,Rubs or new Murmurs, No Parasternal Heave +ve B.Sounds, Abd Soft, No tenderness, No rebound - guarding or rigidity. No Cyanosis, Clubbing or edema, No new Rash or bruise     Data Reviewed: I have personally reviewed following labs and imaging studies  CBC: Recent Labs  Lab 11/05/20 1930  11/05/20 2007 11/06/20 0337 11/07/20 0124 11/08/20 0237 11/09/20 0132  WBC 21.3*  --  31.0* 22.6* 11.7* 10.3  NEUTROABS 20.1*  --  29.4* 20.9* 9.9* 8.5*  HGB 12.5* 12.9* 11.0* 10.3* 9.9* 9.6*  HCT 39.1 38.0* 35.6* 31.7* 31.1* 29.4*  MCV 94.7  --  95.4 93.0 93.4 91.6  PLT 529*  --  635* 470* 410* 102   Basic Metabolic Panel: Recent Labs  Lab 11/06/20 1424 11/06/20 1820 11/06/20 2243 11/07/20 0124 11/08/20 0237 11/09/20 0132 11/09/20 0801  NA 139   < > 136 137 136 136 137  K 3.0*   < > 3.4* 3.2* 2.7* 3.2* 3.0*  CL 110   < > 108 107 104 103 101  CO2 21*   < > 21* 21* 24 27 28   GLUCOSE 139*   < > 153* 169* 79 140* 179*  BUN 15   < > 14 14 10  5* <5*  CREATININE 0.57*   < > 0.48* 0.43* 0.45* 0.44* 0.42*  CALCIUM 8.3*   < > 8.1* 8.2* 7.8* 7.5* 7.7*  MG 1.7  --   --  1.9 1.7 1.8 1.9  PHOS 2.4*  --   --  2.2* 3.0 2.4*  --    < > = values in this interval not displayed.   GFR: Estimated Creatinine Clearance: 92.5 mL/min (A) (by C-G formula based on SCr of 0.42 mg/dL (L)). Liver Function Tests: Recent Labs  Lab 11/06/20 0337 11/06/20 1424 11/07/20 0124 11/08/20 0237 11/09/20 0132  AST 18 29 45* 50* 27  ALT 22 22 28  40 31  ALKPHOS 303* 252* 230* 258* 234*  BILITOT 1.9* 0.1* 0.8 0.7 0.6  PROT 7.8 6.7 6.2* 6.2* 5.6*  ALBUMIN 2.6* 2.2* 2.1* 2.1* 1.7*   No results for input(s): LIPASE, AMYLASE in the last 168 hours. Recent Labs  Lab 11/07/20 0851  AMMONIA 19   Coagulation Profile: Recent Labs  Lab 11/05/20 1930 11/06/20 7253  INR 1.5* 1.6*   Cardiac Enzymes: No results for input(s): CKTOTAL, CKMB, CKMBINDEX, TROPONINI in the last 168 hours. BNP (last 3 results) No results for input(s): PROBNP in the last 8760 hours. HbA1C: No results for input(s): HGBA1C in the last 72 hours. CBG: Recent Labs  Lab 11/08/20 2050 11/08/20 2327 11/09/20 0422 11/09/20 0747 11/09/20 0818  GLUCAP 228* 202* 106* 64* 110*   Lipid Profile: No results for input(s): CHOL, HDL,  LDLCALC, TRIG, CHOLHDL, LDLDIRECT in the last 72 hours. Thyroid Function Tests: No results for input(s): TSH, T4TOTAL, FREET4, T3FREE, THYROIDAB in the last 72 hours. Anemia Panel: Recent Labs    11/09/20 0132  VITAMINB12 464  FOLATE 15.4  FERRITIN 354*  TIBC 154*  IRON 21*  RETICCTPCT 1.1   Sepsis Labs: Recent Labs  Lab 11/05/20 1914 11/06/20 0337 11/07/20 0851 11/08/20 0237  PROCALCITON  --  0.56  0.56 0.59 0.27  LATICACIDVEN 1.6  --   --   --     Recent Results (from the past 240 hour(s))  Resp Panel by RT-PCR (Flu A&B, Covid) Nasopharyngeal Swab     Status: None   Collection Time: 11/05/20  7:14 PM   Specimen: Nasopharyngeal Swab; Nasopharyngeal(NP) swabs in vial transport medium  Result Value Ref Range Status   SARS Coronavirus 2 by RT PCR NEGATIVE NEGATIVE Final    Comment: (NOTE) SARS-CoV-2 target nucleic acids are NOT DETECTED.  The SARS-CoV-2 RNA is generally detectable in upper respiratory specimens during the acute phase of infection. The lowest concentration of SARS-CoV-2 viral copies this assay can detect is 138 copies/mL. A negative result does not preclude SARS-Cov-2 infection and should not be used as the sole basis for treatment or other patient management decisions. A negative result may occur with  improper specimen collection/handling, submission of specimen other than nasopharyngeal swab, presence of viral mutation(s) within the areas targeted by this assay, and inadequate number of viral copies(<138 copies/mL). A negative result must be combined with clinical observations, patient history, and epidemiological information. The expected result is Negative.  Fact Sheet for Patients:  EntrepreneurPulse.com.au  Fact Sheet for Healthcare Providers:  IncredibleEmployment.be  This test is no t yet approved or cleared by the [place] FDA and  has been authorized for detection and/or diagnosis of SARS-CoV-2  by FDA under an Emergency Use Authorization (EUA). This EUA will remain  in effect (meaning this test can be used) for the duration of the COVID-19 declaration under Section 564(b)(1) of the Act, 21 U.S.C.section 360bbb-3(b)(1), unless the authorization is terminated  or revoked sooner.       Influenza A by PCR NEGATIVE NEGATIVE Final   Influenza B by PCR NEGATIVE NEGATIVE Final    Comment: (NOTE) The Xpert Xpress SARS-CoV-2/FLU/RSV plus assay is intended as an aid in the diagnosis of influenza from Nasopharyngeal swab specimens and should not be used as a sole basis for treatment. Nasal washings and aspirates are unacceptable for Xpert Xpress SARS-CoV-2/FLU/RSV testing.  Fact Sheet for Patients: EntrepreneurPulse.com.au  Fact Sheet for Healthcare Providers: IncredibleEmployment.be  This test is not yet approved or cleared by the [place] FDA and has been authorized for detection and/or diagnosis of SARS-CoV-2 by FDA under an Emergency Use Authorization (EUA). This EUA will remain in effect (meaning this test can be used) for the duration of the COVID-19 declaration under Section 564(b)(1) of the Act, 21 U.S.C. section 360bbb-3(b)(1), unless the authorization is terminated or revoked.  Performed at [facility], [place]  [address] [place], [place] [address]   Blood Culture (routine x 2)     Status: None (Preliminary result)   Collection Time: 11/05/20  8:36 PM   Specimen: BLOOD  Result Value Ref Range Status   Specimen Description   Final    BLOOD BLOOD RIGHT FOREARM Performed at [place] [address], [place], [place] [address]    Special Requests   Final    BOTTLES DRAWN AEROBIC AND ANAEROBIC Blood Culture results may not be optimal due to an inadequate volume of blood received in culture bottles Performed at [facility] [address] [place], [place] [address]     Culture   Final    NO GROWTH 3 DAYS Performed at [facility], [place] [address], [place], [place] [address]    Report Status PENDING  Incomplete  Blood Culture (routine x 2)     Status: None (Preliminary result)   Collection Time: 11/05/20  8:38 PM   Specimen: BLOOD  Result Value Ref Range Status   Specimen Description   Final    BLOOD Performed at [facility] [address], [place], [place] [address]    Special Requests   Final    BOTTLES DRAWN AEROBIC AND ANAEROBIC Blood Culture results may not be optimal due to an inadequate volume of blood received in culture bottles Performed at [facility] [address] [place], [place] [address]    Culture   Final    NO GROWTH 3 DAYS Performed at [facility], [place] [address], [place], [place] [address]    Report Status PENDING  Incomplete  Urine culture     Status: Abnormal   Collection Time: 11/05/20  8:38 PM   Specimen: In/Out Cath Urine  Result Value Ref Range Status   Specimen Description   Final    IN/OUT CATH URINE Performed at [facility] [address] [place], [place] [address]    Special Requests   Final    NONE Performed at [facility], [place] [address], [place], [place] [address]    Culture 60,000 COLONIES/mL ESCHERICHIA COLI (A)  Final   Report Status 11/08/2020 FINAL  Final   Organism ID, Bacteria ESCHERICHIA COLI (A)  Final      Susceptibility   Escherichia coli - MIC*    AMPICILLIN 4 SENSITIVE Sensitive     CEFAZOLIN <=4 SENSITIVE Sensitive     CEFEPIME <=0.12 SENSITIVE Sensitive     CEFTRIAXONE <=0.25 SENSITIVE Sensitive     CIPROFLOXACIN <=0.25 SENSITIVE Sensitive     GENTAMICIN <=1 SENSITIVE Sensitive     IMIPENEM <=0.25 SENSITIVE Sensitive     NITROFURANTOIN <=16 SENSITIVE Sensitive     TRIMETH/SULFA <=20 SENSITIVE Sensitive     AMPICILLIN/SULBACTAM <=2 SENSITIVE Sensitive     PIP/TAZO <=4 SENSITIVE Sensitive      * 60,000 COLONIES/mL ESCHERICHIA COLI  MRSA PCR Screening     Status: None   Collection Time: 11/06/20  8:42 AM   Specimen: Nasopharyngeal  Result Value Ref Range Status   MRSA by PCR NEGATIVE NEGATIVE Final    Comment:        The GeneXpert MRSA Assay (FDA approved for NASAL specimens only), is one component of a comprehensive MRSA colonization surveillance program. It is not intended to diagnose MRSA infection nor to guide or monitor treatment for MRSA infections. Performed at [facility], [place] [address] [place], [place] [address]  Aerobic/Anaerobic Culture (surgical/deep wound)     Status: None (Preliminary result)   Collection Time: 11/06/20  3:45 PM   Specimen: Liver; Abscess  Result Value Ref Range Status   Specimen Description   Final    LIVER Performed at [facility] [address], [place], [place] [address]    Special Requests   Final    NONE Performed at [facility], [place] [address], [place], [name] [address]    Gram Stain   Final    FEW GRAM VARIABLE ROD Performed at [facility], [place] [address], [place], [place] [address]    Culture   Final    ABUNDANT KLEBSIELLA PNEUMONIAE CULTURE REINCUBATED FOR BETTER GROWTH SUSCEPTIBILITIES TO FOLLOW NO ANAEROBES ISOLATED; CULTURE IN PROGRESS FOR 5 DAYS    Report Status PENDING  Incomplete  Aerobic/Anaerobic Culture w Gram Stain (surgical/deep wound)     Status: None (Preliminary result)   Collection Time: 11/06/20  3:46 PM   Specimen: Liver; Abscess  Result Value Ref Range Status   Specimen Description LIVER ABSCESS  Final   Special Requests MORE INFERIOR RIGHT LIVER  Final   Gram Stain   Final    FEW WBC PRESENT, PREDOMINANTLY PMN FEW GRAM NEGATIVE RODS RARE GRAM POSITIVE COCCI IN PAIRS Performed at [facility], [place] [address], [place], [place] [address]    Culture   Final    CULTURE REINCUBATED FOR BETTER GROWTH NO ANAEROBES ISOLATED;  CULTURE IN PROGRESS FOR 5 DAYS    Report Status PENDING  Incomplete     RN Pressure Injury Documentation: Pressure Injury 11/07/20 Heel Right Stage 1 -  Intact skin with non-blanchable redness of a localized area usually over a bony prominence. (Active)  11/07/20 0800  Location: Heel  Location Orientation: Right  Staging: Stage 1 -  Intact skin with non-blanchable redness of a localized area usually over a bony prominence.  Wound Description (Comments):   Present on Admission:      Pressure Injury 11/07/20 Sacrum Stage 1 -  Intact skin with non-blanchable redness of a localized area usually over a bony prominence. (Active)  11/07/20 2000  Location: Sacrum  Location Orientation:   Staging: Stage 1 -  Intact skin with non-blanchable redness of a localized area usually over a bony prominence.  Wound Description (Comments):   Present on Admission:      Pressure Injury 11/07/20 Heel Right Deep Tissue Pressure Injury - Purple or maroon localized area of discolored intact skin or blood-filled blister due to damage of underlying soft tissue from pressure and/or shear. (Active)  11/07/20 2000  Location: Heel  Location Orientation: Right  Staging: Deep Tissue Pressure Injury - Purple or maroon localized area of discolored intact skin or blood-filled blister due to damage of underlying soft tissue from pressure and/or shear.  Wound Description (Comments):   Present on Admission:      Pressure Injury 11/07/20 Sacrum Deep Tissue Pressure Injury - Purple or maroon localized area of discolored intact skin or blood-filled blister due to damage of underlying soft tissue from pressure and/or shear. (Active)  11/07/20 2000  Location: Sacrum  Location Orientation:   Staging: Deep Tissue Pressure Injury - Purple or maroon localized area of discolored intact skin or blood-filled blister due to damage of underlying soft tissue from pressure and/or shear.  Wound Description (Comments):   Present on  Admission:      Estimated body mass index is 27.3 kg/m as calculated from the  following:   Height as of this encounter: 5\' 10"  (1.778 m).   Weight as of this encounter: 86.3 kg.  Malnutrition Type:   Malnutrition Characteristics:   Nutrition Interventions:    Radiology Studies: CT HEAD WO CONTRAST  Result Date: 11/07/2020 CLINICAL DATA:  Subdural hematoma EXAM: CT HEAD WITHOUT CONTRAST TECHNIQUE: Contiguous axial images were obtained from the base of the skull through the vertex without intravenous contrast. COMPARISON:  Brain MRI same day FINDINGS: Brain: Left holohemispheric subdural collection is diffusely hypodense and measures 10 mm in thickness. Mass effect on the left hemisphere with only trace midline shift. No hydrocephalus. No intraparenchymal hemorrhage. Vascular: No hyperdense vessel or unexpected calcification. Skull: Normal. Negative for fracture or focal lesion. Sinuses/Orbits: No acute finding. Other: None. IMPRESSION: Left holohemispheric subdural collection measuring 10 mm in thickness with mild mass effect on the left hemisphere and only trace midline shift. This is most consistent with a late subacute or chronic subdural hematoma. Electronically Signed   By: Ulyses Jarred M.D.   On: 11/07/2020 21:58   MR BRAIN WO CONTRAST  Addendum Date: 11/07/2020   ADDENDUM REPORT: 11/07/2020 20:54 ADDENDUM: Critical Value/emergent results were called by telephone at the time of interpretation on 11/07/2020 at 8:54 pm to provider Lovey Newcomer , who verbally acknowledged these results. Electronically Signed   By: Ulyses Jarred M.D.   On: 11/07/2020 20:54   Result Date: 11/07/2020 CLINICAL DATA:  Altered mental status EXAM: MRI HEAD WITHOUT CONTRAST TECHNIQUE: Multiplanar, multiecho pulse sequences of the brain and surrounding structures were obtained without intravenous contrast. COMPARISON:  None. FINDINGS: Brain: Left holo hemispheric extra-axial collection, likely hematoma, measuring 10  mm in thickness. Mild mass effect on the left hemisphere with minimal midline shift. There is some subarachnoid blood over the left frontal lobe. Areas of abnormal diffusion restriction in the left temporal and parietal lobes. No intraparenchymal hemorrhage. There is multifocal hyperintense T2-weighted signal within the white matter. Generalized volume loss without a clear lobar predilection. The midline structures are normal. Vascular: Major flow voids are preserved. Skull and upper cervical spine: Normal calvarium and skull base. Visualized upper cervical spine and soft tissues are normal. Sinuses/Orbits:No paranasal sinus fluid levels or advanced mucosal thickening. No mastoid or middle ear effusion. Normal orbits. IMPRESSION: 1. Age indeterminate, mixed left subarachnoid and subdural hematoma measuring 10 mm in thickness. Head CT recommended for better temporal characterization. 2. Areas of suspected acute ischemia in the left temporal and parietal lobes, though diffusion-weighted imaging is complicated by the adjacent blood products and their magnetic susceptibility effects. Electronically Signed: By: Ulyses Jarred M.D. On: 11/07/2020 20:33   DG CHEST PORT 1 VIEW  Result Date: 11/08/2020 CLINICAL DATA:  Dyspnea EXAM: PORTABLE CHEST 1 VIEW COMPARISON:  11/07/2020 FINDINGS: Mild right basilar atelectasis or infiltrate again noted, better appreciated on prior CT examination of 11/06/2020. Mild right-sided volume loss is unchanged. No pneumothorax or pleural effusion. Cardiac size within normal limits. Pulmonary vascularity is normal. Pigtail catheter noted overlying the right upper quadrant. IMPRESSION: Stable right-sided volume loss and mild right basilar atelectasis or infiltrate. Electronically Signed   By: Fidela Salisbury MD   On: 11/08/2020 06:57   DG Swallowing Func-Speech Pathology  Result Date: 11/08/2020 Objective Swallowing Evaluation: Type of Study: MBS-Modified Barium Swallow Study  Patient  Details Name: Colton Bush MRN: [id number] Date of Birth: 21-Feb-1956 Today's Date: 11/08/2020 Time: SLP Start Time (ACUTE ONLY): 5397 -SLP Stop Time (ACUTE ONLY): 1055 SLP Time Calculation (min) (ACUTE ONLY): 15  min Past Medical History: Past Medical History: Diagnosis Date . Arthritis   left hand . Bronchitis 1977 . Cancer ([place]) 03/09/2016  pancreatic cancer, [facility]  . Depression   takes Cymbalta daily . Diabetes mellitus type II   sees Dr. Chalmers Cater  . GERD (gastroesophageal reflux disease)   takes Omeprazole daily . H/O hiatal hernia  . Hyperlipidemia   takes Zocor daily . Hypertension   takes Amlodipine daily . Hypoglycemia 06/18/2017 . Neck pain   C4-7 stenosis and herniated disc . Neuromuscular disorder ([place])   hiatal hernia . Scoliosis   slight . Spinal cord injury, C5-C7 ([place])   c4-c7 . Stiffness of hand joint   d/t cervical issues Past Surgical History: Past Surgical History: Procedure Laterality Date . ANTERIOR CERVICAL DECOMP/DISCECTOMY FUSION  08/18/2011  Procedure: ANTERIOR CERVICAL DECOMPRESSION/DISCECTOMY FUSION 3 LEVELS;  Surgeon: Winfield Cunas, MD;  Location: [facility];  Service: Neurosurgery;  Laterality: N/A;  Anterior Cervical Four-Five/Five-Six/Six-Seven Decompression with Fusion, Plating, and Bonegraft . CARPAL TUNNEL RELEASE  2013  bilateral, per Dr. Christella Noa  . COLONOSCOPY  10-30-14  per Dr. Olevia Perches, clear, repeat in 10 yrs  . egd with esophageal dilation  9-08  per Dr. Olevia Perches . ERCP N/A 03/01/2016  Procedure: ENDOSCOPIC RETROGRADE CHOLANGIOPANCREATOGRAPHY (ERCP) with brushings and stent;  Surgeon: Doran Stabler, MD;  Location: [facility];  Service: Endoscopy;  Laterality: N/A; . ESOPHAGOGASTRODUODENOSCOPY (EGD) WITH PROPOFOL N/A 05/27/2020  Procedure: ESOPHAGOGASTRODUODENOSCOPY (EGD) WITH PROPOFOL;  Surgeon: Milus Banister, MD;  Location: [facility];  Service: Endoscopy;  Laterality: N/A; . EUS N/A 03/09/2016  Procedure: ESOPHAGEAL ENDOSCOPIC ULTRASOUND (EUS) RADIAL;   Surgeon: Milus Banister, MD;  Location: [facility];  Service: Endoscopy;  Laterality: N/A; . EUS N/A 05/27/2020  Procedure: UPPER ENDOSCOPIC ULTRASOUND (EUS) RADIAL;  Surgeon: Milus Banister, MD;  Location: [facility];  Service: Endoscopy;  Laterality: N/A; . FEMUR IM NAIL Right 10/25/2020  Procedure: INTRAMEDULLARY (IM) NAIL FEMORAL;  Surgeon: Rod Can, MD;  Location: [facility];  Service: Orthopedics;  Laterality: Right; . lymph nodes biopsy   . melanoma rt calf  1999 . PORT-A-CATH REMOVAL N/A 04/03/2019  Procedure: PORT REMOVAL;  Surgeon: Stark Klein, MD;  Location: [facility];  Service: General;  Laterality: N/A; . PORTACATH PLACEMENT Left 03/22/2016  Procedure: INSERTION PORT-A-CATH;  Surgeon: Stark Klein, MD;  Location: [facility];  Service: General;  Laterality: Left; . SPINE SURGERY   . TONSILLECTOMY    as a child . ULNAR TUNNEL RELEASE  2013  right arm, per Dr. Christella Noa  . UPPER GASTROINTESTINAL ENDOSCOPY   . WHIPPLE PROCEDURE N/A 09/19/2016  Procedure: DIAGNOSTIC LAPAROSCOPY, LAPAROSCOPIC LIVER BIOPSY, RETROPERITONEAL EXPLORATION, INTRAOPERATIVE ULTRASOUND;  Surgeon: Stark Klein, MD;  Location: [facility];  Service: General;  Laterality: N/A; HPI: Patient is a 65 year old Caucasian male with a past medical history significant for but not limited to pancreatic cancer diagnosed in 2017 with recent recurrence, diabetes mellitus type 2, hypertension, hyperlipidemia, chronic diastolic CHF with an EF of 55 to 60% and grade 1 diastolic dysfunction back in March 2022, recent right hip fracture status post repair and intramedullary nail as well as other comorbidities who presented to [facility] ED with a chief complaint of a sudden change in mental status. In the ED is found to be septic and initial source was thought to be another UTI but then further work-up revealed that he had a liver abscess.  2 drains placed in his liver abscess  Subjective: alert but  drowsy Assessment / Plan / Recommendation [facility] IP CLINICAL  IMPRESSIONS 11/08/2020 Clinical Impression Patient presents with a mild-mod oropharyngeal dysphagia with likely impact from current level of cognitive delays and decreased overall alertness. During oral phase, patient exhibited delays in anterior to posterior transit of thin liquids and puree solids boluses and with barium tablet in puree, exhibited mild premature spillage of puree into vallecular sinus likely due to decreased bolus cohesion. trace to mild oral residuals post initial swallow which eventually cleared with uncued subsequent swallows. During pharyngeal phase of swallow, patient exhibited min vallecular residuals with puree solids, trace to min vallecular, pyriform and posterior pharynx residuals with thin liquids. Residuals were observed to fully clear pharynx during course of the study. Flash penetration was observed with thin liquids via straw sips and cup sips, however penetrate remained above vocal cords and fully cleared laryngeal vestibule. Barium tablet transited throught pharynx without difficulty. Slight narrowing of esophagus observed at level of approximately C4-6 where he has cervical hardware from h/o surgery. This narrowing did not result in significant stasis of boluses or significantly impact pharyngeal and upper esophageal transit. SLP recommending to initiate full liquids diet and with plan to work on upgraded solids trials at bedside. SLP Visit Diagnosis -- Attention and concentration deficit following -- Frontal lobe and executive function deficit following -- Impact on safety and function --   [facility] IP TREATMENT RECOMMENDATION 11/08/2020 Treatment Recommendations Therapy as outlined in treatment plan below   Prognosis 11/08/2020 Prognosis for Safe Diet Advancement Good Barriers to Reach Goals -- Barriers/Prognosis Comment -- [facility] IP DIET RECOMMENDATION 11/08/2020 SLP Diet Recommendations Other (Comment);Thin liquid Liquid Administration via Cup;Straw Medication Administration Whole meds  with puree Compensations Minimize environmental distractions;Slow rate;Small sips/bites Postural Changes Seated upright at 90 degrees   CH[facility] IP OTHER RECOMMENDATIONS 11/08/2020 Recommended Consults -- Oral Care Recommendations Oral care BID;Staff/trained caregiver to provide oral care Other Recommendations --   [facility] IP FOLLOW UP RECOMMENDATIONS 11/08/2020 Follow up Recommendations 24 hour supervision/assistance;Skilled Nursing facility   [facility] IP FREQUENCY AND DURATION 11/08/2020 Speech Therapy Frequency (ACUTE ONLY) min 2x/week Treatment Duration 1 week      [facility] IP ORAL PHASE 11/08/2020 Oral Phase -- Oral - Pudding Teaspoon -- Oral - Pudding Cup -- Oral - Honey Teaspoon -- Oral - Honey Cup -- Oral - Nectar Teaspoon -- Oral - Nectar Cup -- Oral - Nectar Straw -- Oral - Thin Teaspoon -- Oral - Thin Cup Delayed oral transit;Weak lingual manipulation;Holding of bolus;Reduced posterior propulsion;Lingual/palatal residue Oral - Thin Straw Lingual/palatal residue;Delayed oral transit;Weak lingual manipulation;Reduced posterior propulsion Oral - Puree Delayed oral transit;Weak lingual manipulation;Lingual/palatal residue;Reduced posterior propulsion Oral - Mech Soft -- Oral - Regular -- Oral - Multi-Consistency -- Oral - Pill Decreased bolus cohesion;Delayed oral transit;Reduced posterior propulsion;Weak lingual manipulation;Premature spillage Oral Phase - Comment --  CHL IP PHARYNGEAL PHASE 11/08/2020 Pharyngeal Phase -- Pharyngeal- Pudding Teaspoon -- Pharyngeal -- Pharyngeal- Pudding Cup -- Pharyngeal -- Pharyngeal- Honey Teaspoon -- Pharyngeal -- Pharyngeal- Honey Cup -- Pharyngeal -- Pharyngeal- Nectar Teaspoon -- Pharyngeal -- Pharyngeal- Nectar Cup -- Pharyngeal -- Pharyngeal- Nectar Straw -- Pharyngeal -- Pharyngeal- Thin Teaspoon -- Pharyngeal -- Pharyngeal- Thin Cup Pharyngeal residue - valleculae;Pharyngeal residue - pyriform;Pharyngeal residue - posterior pharnyx Pharyngeal Material enters airway, remains ABOVE vocal  cords then ejected out Pharyngeal- Thin Straw Penetration/Aspiration during swallow;Pharyngeal residue - pyriform;Pharyngeal residue - posterior pharnyx;Pharyngeal residue - valleculae Pharyngeal Material enters airway, remains ABOVE vocal cords then ejected out Pharyngeal- Puree Pharyngeal residue - valleculae Pharyngeal --  Pharyngeal- Mechanical Soft -- Pharyngeal -- Pharyngeal- Regular -- Pharyngeal -- Pharyngeal- Multi-consistency -- Pharyngeal -- Pharyngeal- Pill WFL Pharyngeal -- Pharyngeal Comment --  CHL IP CERVICAL ESOPHAGEAL PHASE 11/08/2020 Cervical Esophageal Phase Impaired Pudding Teaspoon -- Pudding Cup -- Honey Teaspoon -- Honey Cup -- Nectar Teaspoon -- Nectar Cup -- Nectar Straw -- Thin Teaspoon -- Thin Cup Other (Comment) Thin Straw -- Puree -- Mechanical Soft -- Regular -- Multi-consistency -- Pill -- Cervical Esophageal Comment -- Sonia Baller, MA, CCC-SLP Speech Therapy             EEG adult  Result Date: 11/08/2020 Plancher, Baron Sane, MD     11/08/2020  4:46 PM PROCEDURE: EEG video routine 23 minutesstudy DATES OF TEST: 11/08/20 REASON FOR TEST: Seizure AED/Sedative MEDICATIONS: Keppra TECHNIQUE: This is an 18 channel digital EEG recording using the standard international 10/20 system of electrode placement with one channel EKG recording. Pt was awake and drowsy during this recording. FINDINGS: Background rhythm:symmetric 8.5-12 Hz  With intermittent asymmetric left 3-5 Hz Amplitude : normal Breach effect:  NO Variability: NO Reactivity: NO Rhythmic delta activity:NO Periodic discharges: NO Sporadic epileptiform discharges:NO Electrographic/electroclinical seizures:NO Limited EKG reveals no abnormalities. IMPRESSION: This is an abnormal study due to - 1. L Focal slowing is consistent with a focal disturbance of cerebral function and an underlying structural lesion should be considered. 2. No definite epileptiform discharges or electrographic seizures noted.   Overnight EEG with  video  Result Date: 11/09/2020 Lora Havens, MD     11/09/2020  4:52 AM Patient Name: Colton Bush MRN: [id number] Epilepsy Attending: Lora Havens Referring Physician/Provider: Dr Roland Rack Duration: 11/08/2020 2157 to 11/09/2020 0500 Patient history: 65 year old male with a history of pancreatic adenocarcinoma with recurrence, recent fall and admission for sepsis, now with seizures. EEG to evaluate for seizure Level of alertness: Awake, asleep AEDs during EEG study: LEV Technical aspects: This EEG study was done with scalp electrodes positioned according to the 10-20 International system of electrode placement. Electrical activity was acquired at a sampling rate of 500Hz  and reviewed with a high frequency filter of 70Hz  and a Bush frequency filter of 1Hz . EEG data were recorded continuously and digitally stored. Description: The posterior dominant rhythm consists of 9 Hz activity of moderate voltage (25-35 uV) seen predominantly in posterior head regions, symmetric and reactive to eye opening and eye closing.  Sleep was characterized by vertex waves, sleep spindles (12 to 14 Hz), maximal frontocentral region.  EEG showed intermittent 3 to 6 Hz theta-delta slowing in left temporal region. Hyperventilation and photic stimulation were not performed.   ABNORMALITY - Intermittent slow, left temporal region IMPRESSION: This study is suggestive of cortical dysfunction arising from left temporal region, nonspecific etiology but likely secondary to underlying subdural Hematoma. No seizures or definite epileptiform discharges were seen throughout the recording. Priyanka Barbra Sarks   Scheduled Meds: . Chlorhexidine Gluconate Cloth  6 each Topical Daily  . insulin aspart  0-9 Units Subcutaneous Q4H  . insulin detemir  20 Units Subcutaneous BID  . ipratropium  0.5 mg Nebulization BID  . levalbuterol  0.63 mg Nebulization BID  . lipase/protease/amylase  12,000 Units Oral TID with meals  . loratadine  10  mg Oral Daily  . mouth rinse  15 mL Mouth Rinse BID  . metroNIDAZOLE  500 mg Oral Q8H  . pantoprazole (PROTONIX) IV  40 mg Intravenous Q24H  . sodium chloride flush  5 mL Intracatheter Q8H  . tamsulosin  0.4 mg Oral Daily   Continuous Infusions: . cefTRIAXone (ROCEPHIN)  IV 2 g (11/09/20 0909)  . dextrose 5% lactated ringers 75 mL/hr at 11/09/20 0033  . levETIRAcetam Stopped (11/09/20 0714)    LOS: 3 days   Phillips Climes, MD [facility] PAGER is on [place]  If 7PM-7AM, please contact night-coverage [email or web address]

## 2020-11-09 NOTE — Progress Notes (Addendum)
HEMATOLOGY-ONCOLOGY PROGRESS NOTE  SUBJECTIVE: Colton Bush is known to our practice.  We follow him for pancreatic adenocarcinoma with local recurrence found in February 2022.  This is not resectable and the patient has been referred to radiation.  He has not yet started.  He had a recent hospitalization for right intertrochanteric fracture.  Hospital course was complicated by UTI and strep bacteremia.  Now admitted for severe sepsis with liver abscess and bilateral lower lobe pneumonia.  He also had acute toxic encephalopathy in the setting of infection and possible seizure.  Imaging of the brain concerning for subdural hematoma versus empyema.  He was taken to the OR earlier today and hematoma was evacuated.  The patient was seen in the ICU postoperatively.  Reports a mild headache.  He offers no other specific complaints today.  Oncology History Overview Note  Cancer Staging Adenocarcinoma of head of pancreas [facility]) Staging form: Pancreas, AJCC 7th Edition - Clinical stage from 03/09/2016: Stage IIB (T2, N1, M0) - Signed by Truitt Merle, MD on 03/17/2016 - Pathologic stage from 11/17/2016: Stage IB (yT2, N0, cM0) - Signed by Truitt Merle, MD on 12/10/2016     Adenocarcinoma of head of pancreas ([place])  02/27/2016 Imaging   CT chest, abdomen and pelvis with contrast showed ill-defined heterogeneity of pancreatic head, highly suspicious for malignancy, ) quit about and biliary ductal dilatation, mildly prominent lymph nodes in the upper abdomen, largest 2 cm in the portocaval . No other metastasis.    03/09/2016 Initial Diagnosis   Adenocarcinoma of head of pancreas ([place])   03/09/2016 Procedure   Upper EUS showed a 3.5 cm irregular mass in the pancreatic head, causing pancreatic and biliary duct obstruction. The mass involves the portal vein 422 mm, strongly suggesting invasion. There is suspicious nearby adenopathy   03/09/2016 Initial Biopsy   Fine-needle aspiration of the pancreatic mass from EUS showed  malignant cells consistent with adenocarcinoma.    03/28/2016 - 05/24/2016 Chemotherapy   neoadjuvant chemo with FOLFIRINOX every 2 weeks, for 5 cycles    06/28/2016 - 08/10/2016 Radiation Therapy   Neoadjuvant radiation by Dr. [name] Site/dose:   Pancreas treated to 45 Gy in 25 fractions. The Pancreas was then boosted to 54 Gy in 5 fractions.   06/28/2016 - 08/10/2016 Chemotherapy   Xeloda 2000 mg in the morning and 1500 mg in the evening, on the day of radiation.   09/04/2016 Imaging   CT Chest Abdomen Pelvis IMPRESSION: 1. Poorly defined pancreatic head mass is grossly stable, with associated pancreatic ductal dilatation and portacaval adenopathy. No associated vascular encasement. 2. Common bile duct stent in place with stable biliary ductal dilatation. Left hepatic lobe atrophy. 3. Hepatomegaly.  Spleen is at the upper limits normal in size. 4. Aortic atherosclerosis (ICD10-170.0). Coronary artery calcification.   09/19/2016 Surgery   Diagnostic laparoscopy and possible whipple procedure by Dr. Barry Dienes. Whipple procedure aborted due to the pancreatic cancer involving SMV, SMA and portal veins.   09/19/2016 Pathology Results   Two liver lesions were noted during diagnostic laparoscopy and biopsy revealed benign hepatic parenchyma with assoicated fibrosis with no evidence of malignancy.   10/06/2016 - 10/20/2016 Chemotherapy   Gemcitabine and Abraxane weekly, on day 1, 8, and 15 every 28 days, starting on 10/06/2016, held after1 cycle due to pending surgery at [facility]   10/31/2016 Imaging   CT CAP w contrast at [facility] 10/31/16 Impression: 1.Interval decrease in the size of the ill-defined pancreatic head mass. There is short segment abutment of the  portal vein/superior mesenteric vein but no evidence of venous distortion. 2.No evidence of metastatic disease in the chest, abdomen and pelvis.   11/17/2016 -  Hospital Admission   Patient presents to hospital for nausea and vomitting    11/17/2016 Surgery   pancreatecomy, promximal subtotal with total duodenectomy, partial  GASTRECTOMY, CHOLEDOCHOENTEROSTOMY AND GASTROJEJUNOSTOMY (WHIPPLE-TYPE PROCEDURE); WITH PANCREATOJEJUNOSTOMY   01/19/2017 - 03/02/2017 Chemotherapy   Gemcitabine monotherapy: 1000mg /m2, d1,8 Q21d x2 cycles planned    05/18/2017 Imaging   CT CAP at [facility] 05/18/17 Impression: 1.Status post Whipple with soft tissue density just posterior to the SMA, which may be postsurgical, lymph node, or less likely recurrent disease. Recommend attention on follow-up 2.No evidence of metastatic disease in the chest, abdomen, and pelvis.     09/20/2017 Imaging   CT CAP at [facility] 09/20/17 Impression: 1. Unchanged subcentimeter soft tissue nodule in the Whipple surgical bed, likely a small lymph node. 2. No evidence of metastatic disease in the chest, abdomen, or pelvis.   01/17/2018 Imaging   01/17/2018 CT CAP Impression: No evidence of recurrent or metastatic disease.   06/13/2018 Imaging   CT CAP W Contrast at [place] on 06/13/18 Impression:  1. Status post Whipple procedure with stable findings at the surgical bed. No convincing evidence of recurrent or metastatic disease within the chest, abdomen, or pelvis.  Electronically Reviewed by:  Gaye Pollack, MD, [facility] Electronically Reviewed on:  06/13/2018 12:15 PM  I have reviewed the images and concur with the above findings.   11/08/2018 Imaging   CT CAP W Contrast  IMPRESSION: 1. No findings of active malignancy. Prior Whipple procedure. Biliary stent in place. 2. Other imaging findings of potential clinical significance: Thoracic scoliosis. Aortic Atherosclerosis (ICD10-I70.0). Multilevel lumbar impingement.     02/06/2019 Imaging   CT CAP W Contrast IMPRESSION: 1. Status post Whipple procedure with no definitive findings to suggest metastatic disease in the chest, abdomen or pelvis. 2. Stable tiny pulmonary nodules in the right upper lobe  compared to prior examinations, strongly favored to be benign. 3. Aortic atherosclerosis, in addition to left main and 2 vessel coronary artery disease. Please note that although the presence of coronary artery calcium documents the presence of coronary artery disease, the severity of this disease and any potential stenosis cannot be assessed on this non-gated CT examination. Assessment for potential risk factor modification, dietary therapy or pharmacologic therapy may be warranted, if clinically indicated. 4. Additional incidental findings, as above.   04/2019 Procedure   He had PAC removed in 04/2019   08/14/2019 Imaging   CT CAP at [facility] 08/14/19 IMPRESSION: 1.  History of pancreatic adenocarcinoma status post Whipple with stable findings at the surgical bed. 2.  No evidence of metastatic disease in the chest, abdomen, or pelvis. 3.  Arterial phase hyperenhancing 9 mm lesion in subcapsular portion of hepatic segment 8, favor a flash filling hemangioma. Attention on follow-up.   09/04/2019 PET scan   IMPRESSION: 1. Post Whipple procedure without evidence of local pancreatic carcinoma recurrence. 2. No evidence metastatic disease in the liver or periportal lymph nodes. 3. No distant metastatic disease.   11/11/2019 Imaging   CT CAP Impression:   1. Stable appearance of arterially enhancing 9 mm lesion within segment 8  of the liver. Continued attention on follow-up.  2. No evidence of metastatic disease within the chest, abdomen, or pelvis.  3. Stable appearance of soft tissue density around the common hepatic  artery, for example series 8, image 44. This may  represent post-surgical  changes. Continued attention on follow-up.    02/10/2020 Imaging   CT CAP  Impression:  Similar appearance of ill-defined soft tissue surrounding the common  hepatic artery, favored to be postsurgical. Attention on follow-up.   No definite evidence of metastatic disease.   Previously  identified arterially enhancing 9 mm lesion in segment 8 of the  liver not well evaluated on today's study due to lack of arterial phase  imaging, possibly flash-filling hemangioma. Recommend attention on  follow-up.    04/14/2020 Tumor Marker   CA 19-9 at 1124   04/26/2020 Imaging   PET  IMPRESSION: Status post Whipple procedure.   Soft tissue fullness in the porta hepatis with associated mild hypermetabolism, worrisome for recurrence.   No evidence of distant metastases.   05/27/2020 Procedure   EUS by Dr Ardis Hughs  IMPRESSION - I was unable to visualize the region of the porta hepatitis (where the PET avid soft tissue was noted) due to his surgically altered anatomy    06/04/2020 Pathology Results    Liver lesion biopsy  Fine Needle Aspiration by Dr Mariah Milling at [place] bland, hypocellular soft tissue fragments.    See Comment.    Comment:  The patient's prior surgical pathology specimen ([id number]) was reviewed. Evidence of adenocarcinoma is absent in this specimen.  Evaluation is limited by scant cellularity.   A repeat tissue sampling procedure may be helpful, if clinically indicated   08/31/2020 Imaging   CT CAP at [facility]  Impression:   1. Increased recurrent soft tissue along the celiac trunk and common and  proper hepatic arteries with unchanged associated occlusion of the  intrahepatic left portal vein and occlusion of the accessory left hepatic  artery. Increased associated narrowing of the common and proper hepatic  arteries.  2. Increased intrahepatic biliary duct dilation likely due to  aforementioned malignant soft tissue tracking into the liver hilum. Suggest  correlation with lab values for biliary obstruction.    08/31/2020 Imaging   MRI abdomen  Impression:   1. No parenchymal abnormality in the previously identified area of concern  in the left hepatic lobe.  2. Similar-appearing atrophy of the left hepatic lobe with mildly increased   intrahepatic bile duct dilation.  3. Multifocal areas of arterial enhancement in the liver. At least 2 of  these demonstrate uptake on the hepatobiliary phase. Findings could  represent a combination of focal perfusion anomalies and FNH.  4. Ill-defined soft tissue surrounding the celiac trunk is better assessed  on same day CT.    08/31/2020 Imaging   MRI at [facility]  Impression:   1. No parenchymal abnormality in the previously identified area of concern  in the left hepatic lobe.  2. Similar-appearing atrophy of the left hepatic lobe with mildly increased  intrahepatic bile duct dilation.  3. Multifocal areas of arterial enhancement in the liver. At least 2 of  these demonstrate uptake on the hepatobiliary phase. Findings could  represent a combination of focal perfusion anomalies and FNH.  4. Ill-defined soft tissue surrounding the celiac trunk is better assessed  on same day CT.     08/31/2020 Imaging   CT CAP at [facility]  Impression:   1. Increased recurrent soft tissue along the celiac trunk and common and  proper hepatic arteries with unchanged associated occlusion of the  intrahepatic left portal vein and occlusion of the accessory left hepatic  artery. Increased associated narrowing of the common and proper hepatic  arteries.  2. Increased intrahepatic biliary duct dilation likely due to  aforementioned malignant soft tissue tracking into the liver hilum. Suggest  correlation with lab values for biliary obstruction.       REVIEW OF SYSTEMS:   The patient is somewhat sedated following surgery.  He does report a mild headache.  I have reviewed the past medical history, past surgical history, social history and family history with the patient and they are unchanged from previous note.   PHYSICAL EXAMINATION: ECOG PERFORMANCE STATUS: 3 - Symptomatic, >50% confined to bed  Vitals:   11/09/20 1431 11/09/20 1445  BP: (!) 108/56 (!) 113/59  Pulse: 78 77  Resp: 19 18  Temp:   97.6 F (36.4 C)  SpO2: 93% 95%   Filed Weights   11/06/20 0800 11/06/20 0903 11/08/20 2103  Weight: 79 kg 79 kg 86.3 kg    Intake/Output from previous day: 04/11 0701 - 04/12 0700 In: 3059.5 [P.O.:750; I.V.:1700.7; IV Piggyback:598.7] Out: 770 [Urine:750; Drains:20]  GENERAL: Conically ill-appearing male, no distress SKIN: skin color, texture, turgor are normal, no rashes or significant lesions LUNGS: clear to auscultation and percussion with normal breathing effort HEART: regular rate & rhythm and no murmurs and no lower extremity edema ABDOMEN:abdomen soft, non-tender and normal bowel sounds NEURO: Alert, no focal neuro deficits  LABORATORY DATA:  I have reviewed the data as listed CMP Latest Ref Rng & Units 11/09/2020 11/09/2020 11/09/2020  Glucose 70 - 99 mg/dL - 179(H) 140(H)  BUN 8 - 23 mg/dL - <5(L) 5(L)  Creatinine 0.61 - 1.24 mg/dL - 0.42(L) 0.44(L)  Sodium 135 - 145 mmol/L 140 137 136  Potassium 3.5 - 5.1 mmol/L 3.0(L) 3.0(L) 3.2(L)  Chloride 98 - 111 mmol/L - 101 103  CO2 22 - 32 mmol/L - 28 27  Calcium 8.9 - 10.3 mg/dL - 7.7(L) 7.5(L)  Total Protein 6.5 - 8.1 g/dL - - 5.6(L)  Total Bilirubin 0.3 - 1.2 mg/dL - - 0.6  Alkaline Phos 38 - 126 U/L - - 234(H)  AST 15 - 41 U/L - - 27  ALT 0 - 44 U/L - - 31    Lab Results  Component Value Date   WBC 10.3 11/09/2020   HGB 10.2 (L) 11/09/2020   HCT 30.0 (L) 11/09/2020   MCV 91.6 11/09/2020   PLT 386 11/09/2020   NEUTROABS 8.5 (H) 11/09/2020    DG Chest 1 View  Result Date: 10/18/2020 CLINICAL DATA:  Fall, pain and RIGHT hip. EXAM: CHEST  1 VIEW COMPARISON:  June 19, 2017 FINDINGS: Trachea midline. Cardiomediastinal contours and hilar structures are normal. Signs of atelectasis and scarring at the lung bases. Evidence of cervical spinal fusion partially imaged on the current study. On limited assessment no acute skeletal process. IMPRESSION: Signs of atelectasis and scarring at the lung bases. Electronically  Signed   By: Zetta Bills M.D.   On: 10/18/2020 19:12   CT HEAD WO CONTRAST  Result Date: 11/07/2020 CLINICAL DATA:  Subdural hematoma EXAM: CT HEAD WITHOUT CONTRAST TECHNIQUE: Contiguous axial images were obtained from the base of the skull through the vertex without intravenous contrast. COMPARISON:  Brain MRI same day FINDINGS: Brain: Left holohemispheric subdural collection is diffusely hypodense and measures 10 mm in thickness. Mass effect on the left hemisphere with only trace midline shift. No hydrocephalus. No intraparenchymal hemorrhage. Vascular: No hyperdense vessel or unexpected calcification. Skull: Normal. Negative for fracture or focal lesion. Sinuses/Orbits: No acute finding. Other: None. IMPRESSION: Left holohemispheric subdural collection measuring 10  mm in thickness with mild mass effect on the left hemisphere and only trace midline shift. This is most consistent with a late subacute or chronic subdural hematoma. Electronically Signed   By: Ulyses Jarred M.D.   On: 11/07/2020 21:58   MR BRAIN WO CONTRAST  Addendum Date: 11/07/2020   ADDENDUM REPORT: 11/07/2020 20:54 ADDENDUM: Critical Value/emergent results were called by telephone at the time of interpretation on 11/07/2020 at 8:54 pm to provider Lovey Newcomer , who verbally acknowledged these results. Electronically Signed   By: Ulyses Jarred M.D.   On: 11/07/2020 20:54   Result Date: 11/07/2020 CLINICAL DATA:  Altered mental status EXAM: MRI HEAD WITHOUT CONTRAST TECHNIQUE: Multiplanar, multiecho pulse sequences of the brain and surrounding structures were obtained without intravenous contrast. COMPARISON:  None. FINDINGS: Brain: Left holo hemispheric extra-axial collection, likely hematoma, measuring 10 mm in thickness. Mild mass effect on the left hemisphere with minimal midline shift. There is some subarachnoid blood over the left frontal lobe. Areas of abnormal diffusion restriction in the left temporal and parietal lobes. No  intraparenchymal hemorrhage. There is multifocal hyperintense T2-weighted signal within the white matter. Generalized volume loss without a clear lobar predilection. The midline structures are normal. Vascular: Major flow voids are preserved. Skull and upper cervical spine: Normal calvarium and skull base. Visualized upper cervical spine and soft tissues are normal. Sinuses/Orbits:No paranasal sinus fluid levels or advanced mucosal thickening. No mastoid or middle ear effusion. Normal orbits. IMPRESSION: 1. Age indeterminate, mixed left subarachnoid and subdural hematoma measuring 10 mm in thickness. Head CT recommended for better temporal characterization. 2. Areas of suspected acute ischemia in the left temporal and parietal lobes, though diffusion-weighted imaging is complicated by the adjacent blood products and their magnetic susceptibility effects. Electronically Signed: By: Ulyses Jarred M.D. On: 11/07/2020 20:33   CT CHEST ABDOMEN PELVIS W CONTRAST  Result Date: 11/06/2020 CLINICAL DATA:  Pyelonephritis.  Complicated sepsis. EXAM: CT CHEST, ABDOMEN, AND PELVIS WITH CONTRAST TECHNIQUE: Multidetector CT imaging of the chest, abdomen and pelvis was performed following the standard protocol during bolus administration of intravenous contrast. CONTRAST:  167mL OMNIPAQUE IOHEXOL 300 MG/ML  SOLN COMPARISON:  Outside abdominal CT 10/14/2020 FINDINGS: CT CHEST FINDINGS Cardiovascular: Normal heart size. No pericardial effusion. Coronary atherosclerosis. Limited opacification of the pulmonary arteries. Mediastinum/Nodes: Negative for adenopathy or mass. Lungs/Pleura: Airspace opacity in the subpleural left lower lobe and patchy reticulonodular density in the bilateral lower lobes. No edema or effusion. No pneumothorax. Musculoskeletal: No acute finding.  Mild scoliosis CT ABDOMEN PELVIS FINDINGS Hepatobiliary: Sequela of Whipple procedure.There is soft tissue density at the liver hilum better demonstrated on prior  pancreas protocol CT with arterial narrowings described on that study. Intrahepatic bile duct dilatation which is similar to previous. Left lobe of the liver is atrophic in the setting of chronic left portal vein occlusion and hepatic arterial narrowings. There is a cluster of new low-density areas in the posterior right lobe liver with the largest collection measuring 4.6 cm. Mild hypo perfusion in the adjacent parenchyma. Pancreas: Status post Whipple procedure. Atrophy of the remaining pancreas. Soft tissue density around the celiac branches as described on outside CT staging. No acute pancreatitis is seen. Spleen: Negative Adrenals/Urinary Tract: Negative adrenals. No hydronephrosis or stone. Unremarkable bladder which is decompressed by a Foley catheter. Stomach/Bowel: No evidence of bowel obstruction. Sequela of local procedure. Rectal stool distention with mild mesorectal edema. The rectum measures up to 8 cm in diameter. Vascular/Lymphatic: Celiac branch narrowings and chronic left  portal vein occlusion as described above. Atheromatous calcification. No discrete retroperitoneal nodes. Reproductive:No pathologic findings. Other: No ascites or pneumoperitoneum. Musculoskeletal: Intertrochanteric right femur fracture with recent repair. No acute osseous finding. Lumbar spine degeneration and scoliosis. Message was sent to the clinical team via secure chat in epic. IMPRESSION: 1. Cluster of bilomas/abscess in the posterior right lobe liver with the largest component measuring up to 4.6 cm. Bile duct dilatation is similar to recent staging scan 08/31/2020. 2. Whipple procedure for pancreas carcinoma with infiltrative soft tissue about the narrowed celiac branches and hepatic hilum, reference dedicated staging [facility] at [facility] 08/31/2020. 3. No CT findings to correlate with history of pyelonephritis. 4. Airspace disease in the bilateral lower lobes compatible with pneumonia or aspiration. Notable subpleural  involvement in the left lower lobe is likely consolidation given the overall pattern but please have low threshold for lower extremity Doppler ultrasound given recent right femur fracture repair. 5. Moderate stool distended rectum with mesorectal edema. Electronically Signed   By: Monte Fantasia M.D.   On: 11/06/2020 06:29   DG CHEST PORT 1 VIEW  Result Date: 11/08/2020 CLINICAL DATA:  Dyspnea EXAM: PORTABLE CHEST 1 VIEW COMPARISON:  11/07/2020 FINDINGS: Mild right basilar atelectasis or infiltrate again noted, better appreciated on prior CT examination of 11/06/2020. Mild right-sided volume loss is unchanged. No pneumothorax or pleural effusion. Cardiac size within normal limits. Pulmonary vascularity is normal. Pigtail catheter noted overlying the right upper quadrant. IMPRESSION: Stable right-sided volume loss and mild right basilar atelectasis or infiltrate. Electronically Signed   By: Fidela Salisbury MD   On: 11/08/2020 06:57   DG CHEST PORT 1 VIEW  Result Date: 11/07/2020 CLINICAL DATA:  Shortness of breath EXAM: PORTABLE CHEST 1 VIEW COMPARISON:  November 05, 2020 chest radiograph; chest CT November 06, 2020 FINDINGS: There is airspace opacity in each lung base. Lungs elsewhere clear. Heart size and pulmonary vascularity normal. No adenopathy. There is lower thoracic dextroscoliosis. There is postoperative change in the lower cervical spine. IMPRESSION: Patchy airspace opacity in the lung bases, similar to 1 day prior but increased compared to 2 days prior. Stable cardiac silhouette. No adenopathy evident. Electronically Signed   By: Lowella Grip III M.D.   On: 11/07/2020 07:58   DG Chest Port 1 View  Result Date: 11/05/2020 CLINICAL DATA:  Sepsis. EXAM: PORTABLE CHEST 1 VIEW COMPARISON:  Chest x-ray 10/18/2020 FINDINGS: The cardiac silhouette, mediastinal and hilar contours are within normal limits. The lungs are clear of an acute process. No pulmonary lesions or pleural effusion. The bony thorax is  intact. IMPRESSION: No acute cardiopulmonary findings. Electronically Signed   By: Marijo Sanes M.D.   On: 11/05/2020 18:53   DG Knee Right Port  Result Date: 10/19/2020 CLINICAL DATA:  History of distal femur fracture. EXAM: PORTABLE RIGHT KNEE - 1-2 VIEW COMPARISON:  No prior. FINDINGS: Limited two view of exam obtained. Mild soft tissue swelling about the knee cannot be excluded. Severe Tricompartment degenerative change with chondrocalcinosis. Deformity noted of the proximal fibula consistent with old healed fracture. No acute fracture identified. IMPRESSION: 1.  Mild soft tissue swelling about the knee cannot be excluded. 2. Severe tricompartment degenerative change with chondrocalcinosis. Deformity noted the proximal right fibula consistent with old healed fracture. No acute bony abnormality identified. Electronically Signed   By: Marcello Moores  Register   On: 10/19/2020 09:46   DG Swallowing Func-Speech Pathology  Result Date: 11/08/2020 Objective Swallowing Evaluation: Type of Study: MBS-Modified Barium Swallow Study  Patient Details  Name: Colton Bush MRN: [id number] Date of Birth: 20-Oct-1955 Today's Date: 11/08/2020 Time: SLP Start Time (ACUTE ONLY): 2563 -SLP Stop Time (ACUTE ONLY): 1055 SLP Time Calculation (min) (ACUTE ONLY): 15 min Past Medical History: Past Medical History: Diagnosis Date . Arthritis   left hand . Bronchitis 1977 . Cancer ([place]) 03/09/2016  pancreatic cancer, sees Dr. Cristino Martes at [facility]  . Depression   takes Cymbalta daily . Diabetes mellitus type II   sees Dr. Chalmers Cater  . GERD (gastroesophageal reflux disease)   takes Omeprazole daily . H/O hiatal hernia  . Hyperlipidemia   takes Zocor daily . Hypertension   takes Amlodipine daily . Hypoglycemia 06/18/2017 . Neck pain   C4-7 stenosis and herniated disc . Neuromuscular disorder ([place])   hiatal hernia . Scoliosis   slight . Spinal cord injury, C5-C7 ([place])   c4-c7 . Stiffness of hand joint   d/t cervical issues Past Surgical History: Past  Surgical History: Procedure Laterality Date . ANTERIOR CERVICAL DECOMP/DISCECTOMY FUSION  08/18/2011  Procedure: ANTERIOR CERVICAL DECOMPRESSION/DISCECTOMY FUSION 3 LEVELS;  Surgeon: Winfield Cunas, MD;  Location: [place] [facility];  Service: Neurosurgery;  Laterality: N/A;  Anterior Cervical Four-Five/Five-Six/Six-Seven Decompression with Fusion, Plating, and Bonegraft . CARPAL TUNNEL RELEASE  2013  bilateral, per Dr. Christella Noa  . COLONOSCOPY  10-30-14  per Dr. Olevia Perches, clear, repeat in 10 yrs  . egd with esophageal dilation  9-08  per Dr. Olevia Perches . ERCP N/A 03/01/2016  Procedure: ENDOSCOPIC RETROGRADE CHOLANGIOPANCREATOGRAPHY (ERCP) with brushings and stent;  Surgeon: Doran Stabler, MD;  Location: [facility];  Service: Endoscopy;  Laterality: N/A; . ESOPHAGOGASTRODUODENOSCOPY (EGD) WITH PROPOFOL N/A 05/27/2020  Procedure: ESOPHAGOGASTRODUODENOSCOPY (EGD) WITH PROPOFOL;  Surgeon: Milus Banister, MD;  Location: [facility];  Service: Endoscopy;  Laterality: N/A; . EUS N/A 03/09/2016  Procedure: ESOPHAGEAL ENDOSCOPIC ULTRASOUND (EUS) RADIAL;  Surgeon: Milus Banister, MD;  Location: [facility];  Service: Endoscopy;  Laterality: N/A; . EUS N/A 05/27/2020  Procedure: UPPER ENDOSCOPIC ULTRASOUND (EUS) RADIAL;  Surgeon: Milus Banister, MD;  Location: [facility];  Service: Endoscopy;  Laterality: N/A; . FEMUR IM NAIL Right 10/25/2020  Procedure: INTRAMEDULLARY (IM) NAIL FEMORAL;  Surgeon: Rod Can, MD;  Location: [facility];  Service: Orthopedics;  Laterality: Right; . lymph nodes biopsy   . melanoma rt calf  1999 . PORT-A-CATH REMOVAL N/A 04/03/2019  Procedure: PORT REMOVAL;  Surgeon: Stark Klein, MD;  Location: [place];  Service: General;  Laterality: N/A; . PORTACATH PLACEMENT Left 03/22/2016  Procedure: INSERTION PORT-A-CATH;  Surgeon: Stark Klein, MD;  Location: [facility];  Service: General;  Laterality: Left; . SPINE SURGERY   . TONSILLECTOMY    as a child . ULNAR TUNNEL RELEASE  2013  right arm, per Dr. Christella Noa  .  UPPER GASTROINTESTINAL ENDOSCOPY   . WHIPPLE PROCEDURE N/A 09/19/2016  Procedure: DIAGNOSTIC LAPAROSCOPY, LAPAROSCOPIC LIVER BIOPSY, RETROPERITONEAL EXPLORATION, INTRAOPERATIVE ULTRASOUND;  Surgeon: Stark Klein, MD;  Location: [place];  Service: General;  Laterality: N/A; HPI: Patient is a 65 year old Caucasian male with a past medical history significant for but not limited to pancreatic cancer diagnosed in 2017 with recent recurrence, diabetes mellitus type 2, hypertension, hyperlipidemia, chronic diastolic CHF with an EF of 55 to 60% and grade 1 diastolic dysfunction back in March 2022, recent right hip fracture status post repair and intramedullary nail as well as other comorbidities who presented to [name] long ED with a chief complaint of a sudden change in mental status. In the ED is found to  be septic and initial source was thought to be another UTI but then further work-up revealed that he had a liver abscess.  2 drains placed in his liver abscess  Subjective: alert but drowsy Assessment / Plan / Recommendation CHL IP CLINICAL IMPRESSIONS 11/08/2020 Clinical Impression Patient presents with a mild-mod oropharyngeal dysphagia with likely impact from current level of cognitive delays and decreased overall alertness. During oral phase, patient exhibited delays in anterior to posterior transit of thin liquids and puree solids boluses and with barium tablet in puree, exhibited mild premature spillage of puree into vallecular sinus likely due to decreased bolus cohesion. trace to mild oral residuals post initial swallow which eventually cleared with uncued subsequent swallows. During pharyngeal phase of swallow, patient exhibited min vallecular residuals with puree solids, trace to min vallecular, pyriform and posterior pharynx residuals with thin liquids. Residuals were observed to fully clear pharynx during course of the study. Flash penetration was observed with thin liquids via straw sips and cup sips, however  penetrate remained above vocal cords and fully cleared laryngeal vestibule. Barium tablet transited throught pharynx without difficulty. Slight narrowing of esophagus observed at level of approximately C4-6 where he has cervical hardware from h/o surgery. This narrowing did not result in significant stasis of boluses or significantly impact pharyngeal and upper esophageal transit. SLP recommending to initiate full liquids diet and with plan to work on upgraded solids trials at bedside. SLP Visit Diagnosis -- Attention and concentration deficit following -- Frontal lobe and executive function deficit following -- Impact on safety and function --   [facility] IP TREATMENT RECOMMENDATION 11/08/2020 Treatment Recommendations Therapy as outlined in treatment plan below   Prognosis 11/08/2020 Prognosis for Safe Diet Advancement Good Barriers to Reach Goals -- Barriers/Prognosis Comment -- [facility] IP DIET RECOMMENDATION 11/08/2020 SLP Diet Recommendations Other (Comment);Thin liquid Liquid Administration via Cup;Straw Medication Administration Whole meds with puree Compensations Minimize environmental distractions;Slow rate;Small sips/bites Postural Changes Seated upright at 90 degrees   CH[facility] IP OTHER RECOMMENDATIONS 11/08/2020 Recommended Consults -- Oral Care Recommendations Oral care BID;Staff/trained caregiver to provide oral care Other Recommendations --   [facility] IP FOLLOW UP RECOMMENDATIONS 11/08/2020 Follow up Recommendations 24 hour supervision/assistance;Skilled Nursing facility   [facility] IP FREQUENCY AND DURATION 11/08/2020 Speech Therapy Frequency (ACUTE ONLY) min 2x/week Treatment Duration 1 week      CH[facility] IP ORAL PHASE 11/08/2020 Oral Phase -- Oral - Pudding Teaspoon -- Oral - Pudding Cup -- Oral - Honey Teaspoon -- Oral - Honey Cup -- Oral - Nectar Teaspoon -- Oral - Nectar Cup -- Oral - Nectar Straw -- Oral - Thin Teaspoon -- Oral - Thin Cup Delayed oral transit;Weak lingual manipulation;Holding of bolus;Reduced posterior  propulsion;Lingual/palatal residue Oral - Thin Straw Lingual/palatal residue;Delayed oral transit;Weak lingual manipulation;Reduced posterior propulsion Oral - Puree Delayed oral transit;Weak lingual manipulation;Lingual/palatal residue;Reduced posterior propulsion Oral - Mech Soft -- Oral - Regular -- Oral - Multi-Consistency -- Oral - Pill Decreased bolus cohesion;Delayed oral transit;Reduced posterior propulsion;Weak lingual manipulation;Premature spillage Oral Phase - Comment --  CHL IP PHARYNGEAL PHASE 11/08/2020 Pharyngeal Phase -- Pharyngeal- Pudding Teaspoon -- Pharyngeal -- Pharyngeal- Pudding Cup -- Pharyngeal -- Pharyngeal- Honey Teaspoon -- Pharyngeal -- Pharyngeal- Honey Cup -- Pharyngeal -- Pharyngeal- Nectar Teaspoon -- Pharyngeal -- Pharyngeal- Nectar Cup -- Pharyngeal -- Pharyngeal- Nectar Straw -- Pharyngeal -- Pharyngeal- Thin Teaspoon -- Pharyngeal -- Pharyngeal- Thin Cup Pharyngeal residue - valleculae;Pharyngeal residue - pyriform;Pharyngeal residue - posterior pharnyx Pharyngeal Material enters airway, remains ABOVE vocal cords then ejected out Pharyngeal-  Thin Straw Penetration/Aspiration during swallow;Pharyngeal residue - pyriform;Pharyngeal residue - posterior pharnyx;Pharyngeal residue - valleculae Pharyngeal Material enters airway, remains ABOVE vocal cords then ejected out Pharyngeal- Puree Pharyngeal residue - valleculae Pharyngeal -- Pharyngeal- Mechanical Soft -- Pharyngeal -- Pharyngeal- Regular -- Pharyngeal -- Pharyngeal- Multi-consistency -- Pharyngeal -- Pharyngeal- Pill WFL Pharyngeal -- Pharyngeal Comment --  CHL IP CERVICAL ESOPHAGEAL PHASE 11/08/2020 Cervical Esophageal Phase Impaired Pudding Teaspoon -- Pudding Cup -- Honey Teaspoon -- Honey Cup -- Nectar Teaspoon -- Nectar Cup -- Nectar Straw -- Thin Teaspoon -- Thin Cup Other (Comment) Thin Straw -- Puree -- Mechanical Soft -- Regular -- Multi-consistency -- Pill -- Cervical Esophageal Comment -- Sonia Baller, MA,  CCC-SLP Speech Therapy             EEG adult  Result Date: 11/08/2020 Plancher, Baron Sane, MD     11/08/2020  4:46 PM PROCEDURE: EEG video routine 23 minutesstudy DATES OF TEST: 11/08/20 REASON FOR TEST: Seizure AED/Sedative MEDICATIONS: Keppra TECHNIQUE: This is an 18 channel digital EEG recording using the standard international 10/20 system of electrode placement with one channel EKG recording. Pt was awake and drowsy during this recording. FINDINGS: Background rhythm:symmetric 8.5-12 Hz  With intermittent asymmetric left 3-5 Hz Amplitude : normal Breach effect:  NO Variability: NO Reactivity: NO Rhythmic delta activity:NO Periodic discharges: NO Sporadic epileptiform discharges:NO Electrographic/electroclinical seizures:NO Limited EKG reveals no abnormalities. IMPRESSION: This is an abnormal study due to - 1. L Focal slowing is consistent with a focal disturbance of cerebral function and an underlying structural lesion should be considered. 2. No definite epileptiform discharges or electrographic seizures noted.   Overnight EEG with video  Result Date: 11/09/2020 Lora Havens, MD     11/09/2020  4:52 AM Patient Name: Colton Bush MRN: [id number] Epilepsy Attending: Lora Havens Referring Physician/Provider: Dr Roland Rack Duration: 11/08/2020 2157 to 11/09/2020 0500 Patient history: 65 year old male with a history of pancreatic adenocarcinoma with recurrence, recent fall and admission for sepsis, now with seizures. EEG to evaluate for seizure Level of alertness: Awake, asleep AEDs during EEG study: LEV Technical aspects: This EEG study was done with scalp electrodes positioned according to the 10-20 International system of electrode placement. Electrical activity was acquired at a sampling rate of 500Hz  and reviewed with a high frequency filter of 70Hz  and a low frequency filter of 1Hz . EEG data were recorded continuously and digitally stored. Description: The posterior dominant rhythm  consists of 9 Hz activity of moderate voltage (25-35 uV) seen predominantly in posterior head regions, symmetric and reactive to eye opening and eye closing.  Sleep was characterized by vertex waves, sleep spindles (12 to 14 Hz), maximal frontocentral region.  EEG showed intermittent 3 to 6 Hz theta-delta slowing in left temporal region. Hyperventilation and photic stimulation were not performed.   ABNORMALITY - Intermittent slow, left temporal region IMPRESSION: This study is suggestive of cortical dysfunction arising from left temporal region, nonspecific etiology but likely secondary to underlying subdural Hematoma. No seizures or definite epileptiform discharges were seen throughout the recording. Lora Havens   DG C-Arm 1-60 Min-No Report  Result Date: 10/25/2020 Fluoroscopy was utilized by the requesting physician.  No radiographic interpretation.   ECHOCARDIOGRAM COMPLETE  Result Date: 10/22/2020    ECHOCARDIOGRAM REPORT   Patient Name:   Colton Bush Date of Exam: 10/22/2020 Medical Rec #:  [id number]      Height:       70.0 in Accession #:    [id number]  Weight:       196.0 lb Date of Birth:  1956-01-19      BSA:          2.069 m Patient Age:    56 years       BP:           136/69 mmHg Patient Gender: M              HR:           79 bpm. Exam Location:  Inpatient Procedure: 2D Echo, Cardiac Doppler and Color Doppler Indications:    Bacteremia  History:        Patient has no prior history of Echocardiogram examinations.                 Signs/Symptoms:Bacteremia and UTI; Risk Factors:Hypertension,                 Diabetes and Dyslipidemia. Pancreatic cancer.  Sonographer:    Dustin Flock Referring Phys: [phone] A CALDWELL POWELL JR  Sonographer Comments: Image acquisition challenging due to respiratory motion. IMPRESSIONS  1. Left ventricular ejection fraction, by estimation, is 55 to 60%. The left ventricle has normal function. The left ventricle has no regional wall motion abnormalities.  There is mild concentric left ventricular hypertrophy. Left ventricular diastolic parameters are consistent with Grade I diastolic dysfunction (impaired relaxation).  2. Right ventricular systolic function is normal. The right ventricular size is normal.  3. The mitral valve is normal in structure. No evidence of mitral valve regurgitation. No evidence of mitral stenosis.  4. The aortic valve is normal in structure. Aortic valve regurgitation is not visualized. No aortic stenosis is present.  5. The inferior vena cava is normal in size with greater than 50% respiratory variability, suggesting right atrial pressure of 3 mmHg. Conclusion(s)/Recommendation(s): No evidence of valvular vegetations on this transthoracic echocardiogram. Would recommend a transesophageal echocardiogram to exclude infective endocarditis if clinically indicated. FINDINGS  Left Ventricle: Left ventricular ejection fraction, by estimation, is 55 to 60%. The left ventricle has normal function. The left ventricle has no regional wall motion abnormalities. The left ventricular internal cavity size was normal in size. There is  mild concentric left ventricular hypertrophy. Left ventricular diastolic parameters are consistent with Grade I diastolic dysfunction (impaired relaxation). Indeterminate filling pressures. Right Ventricle: The right ventricular size is normal. No increase in right ventricular wall thickness. Right ventricular systolic function is normal. Left Atrium: Left atrial size was normal in size. Right Atrium: Right atrial size was normal in size. Pericardium: There is no evidence of pericardial effusion. Mitral Valve: The mitral valve is normal in structure. Mild mitral annular calcification. No evidence of mitral valve regurgitation. No evidence of mitral valve stenosis. Tricuspid Valve: The tricuspid valve is normal in structure. Tricuspid valve regurgitation is not demonstrated. No evidence of tricuspid stenosis. Aortic Valve:  The aortic valve is normal in structure. Aortic valve regurgitation is not visualized. No aortic stenosis is present. Pulmonic Valve: The pulmonic valve was normal in structure. Pulmonic valve regurgitation is not visualized. No evidence of pulmonic stenosis. Aorta: The aortic root is normal in size and structure. Venous: The inferior vena cava is normal in size with greater than 50% respiratory variability, suggesting right atrial pressure of 3 mmHg. IAS/Shunts: No atrial level shunt detected by color flow Doppler.  LEFT VENTRICLE PLAX 2D LVIDd:         3.90 cm  Diastology LVIDs:         2.70  cm  LV e' medial:    7.72 cm/s LV PW:         1.30 cm  LV E/e' medial:  9.3 LV IVS:        1.30 cm  LV e' lateral:   6.64 cm/s LVOT diam:     2.50 cm  LV E/e' lateral: 10.8 LV SV:         102 LV SV Index:   49 LVOT Area:     4.91 cm  RIGHT VENTRICLE RV Basal diam:  2.90 cm RV S prime:     10.80 cm/s TAPSE (M-mode): 2.9 cm LEFT ATRIUM             Index       RIGHT ATRIUM           Index LA diam:        3.30 cm 1.59 cm/m  RA Area:     12.20 cm LA Vol (A2C):   54.9 ml 26.53 ml/m RA Volume:   29.40 ml  14.21 ml/m LA Vol (A4C):   29.8 ml 14.40 ml/m LA Biplane Vol: 41.4 ml 20.01 ml/m  AORTIC VALVE LVOT Vmax:   92.40 cm/s LVOT Vmean:  63.500 cm/s LVOT VTI:    0.207 m  AORTA Ao Root diam: 3.20 cm MITRAL VALVE MV Area (PHT): 7.16 cm     SHUNTS MV Decel Time: 106 msec     Systemic VTI:  0.21 m MV E velocity: 71.80 cm/s   Systemic Diam: 2.50 cm MV A velocity: 111.00 cm/s MV E/A ratio:  0.65 Mihai Croitoru MD Electronically signed by Sanda Klein MD Signature Date/Time: 10/22/2020/4:07:33 PM    Final    DG HIP OPERATIVE UNILAT W OR W/O PELVIS RIGHT  Result Date: 10/25/2020 CLINICAL DATA:  IM nail right femur EXAM: OPERATIVE right HIP (WITH PELVIS IF PERFORMED) 4 VIEWS TECHNIQUE: Fluoroscopic spot image(s) were submitted for interpretation post-operatively. COMPARISON:  10/18/2020 FINDINGS: Four low resolution intraoperative  spot views of the right hip. Total fluoroscopy time was 48 seconds. The images demonstrate intramedullary rod and distal screw fixation of right femur for comminuted intertrochanteric fracture. IMPRESSION: Intraoperative fluoroscopic assistance provided during surgical fixation of right proximal femoral fracture Electronically Signed   By: Donavan Foil M.D.   On: 10/25/2020 19:10   DG Hip Unilat With Pelvis 2-3 Views Right  Result Date: 10/18/2020 CLINICAL DATA:  Fall, hip pain status post fall EXAM: DG HIP (WITH OR WITHOUT PELVIS) 2-3V RIGHT COMPARISON:  Prior CT imaging of the chest, abdomen and pelvis. FINDINGS: No fracture of the bony pelvis.  Spinal degenerative changes. Comminuted RIGHT proximal femoral fracture, intratrochanteric type with distraction of the lesser trochanter. Mild displacement of distal femur relative to proximal femur, slight anterior displacement 2-3 mm. Mild to moderate varus angulation at the fracture site. Sclerotic lesion in the LEFT iliac bone unchanged likely large bone island. IMPRESSION: Comminuted mildly angulated RIGHT intratrochanteric fracture. Electronically Signed   By: Zetta Bills M.D.   On: 10/18/2020 19:17   CT IMAGE GUIDED DRAINAGE BY PERCUTANEOUS CATHETER  Result Date: 11/08/2020 INDICATION: 65 year old male with a history hepatic abscess referred for drainage EXAM: CT GUIDED DRAINAGE OF HEPATIC ABSCESS MEDICATIONS: The patient is currently admitted to the hospital and receiving intravenous antibiotics. The antibiotics were administered within an appropriate time frame prior to the initiation of the procedure. ANESTHESIA/SEDATION: 0 mg IV Versed 25 mcg IV Fentanyl Moderate Sedation Time:  0 The patient was continuously monitored during the procedure  by the interventional radiology nurse under my direct supervision. COMPLICATIONS: None TECHNIQUE: Informed written consent was obtained from the patient after a thorough discussion of the procedural risks,  benefits and alternatives. All questions were addressed. Maximal Sterile Barrier Technique was utilized including caps, mask, sterile gowns, sterile gloves, sterile drape, hand hygiene and skin antiseptic. A timeout was performed prior to the initiation of the procedure. PROCEDURE: CT of the liver was performed with the patient in right anterior oblique position. The right upper quadrant was prepped with chlorhexidine in a sterile fashion, and a sterile drape was applied covering the operative field. A sterile gown and sterile gloves were used for the procedure. Local anesthesia was provided with 1% Lidocaine. Once we determine appropriate angle of approach for drainage, 1% lidocaine was used for local anesthesia. Using CT guidance, 2 separate 18 gauge trocar needles were advanced intercostal location towards the posterior right hepatic abscess. Once we confirmed needle tip position, modified Seldinger technique was used to place 2 separate 10 French pigtail drainage catheters. Aspiration of purulence fluid at both location confirmed adequate positioning. Both drains were sutured in position and attached to bulb suction. Final CT was acquired. Patient tolerated procedure well and remained hemodynamically stable throughout. No complications were encountered and no significant blood loss. FINDINGS: CT confirms 2 hypoechoic fluid collection in the posterior right liver. After CT-guided drainage, pigtail catheters remain adequately position with collapse of both cavities. Sample was sent from both sites. IMPRESSION: Status post CT-guided drainage of 2 separate right-sided hepatic abscess. Signed, Dulcy Fanny. Dellia Nims, RPVI [facility] Radiology Electronically Signed   By: Corrie Mckusick D.O.   On: 11/08/2020 08:21   ECHOCARDIOGRAM LIMITED  Result Date: 11/08/2020    ECHOCARDIOGRAM REPORT   Patient Name:   Colton Bush Date of Exam: 11/06/2020 Medical Rec #:  [id number]       Height:       70.0 in Accession #:    [id number]     Weight:       174.2 lb Date of Birth:  22-Feb-1956      BSA:          1.968 m Patient Age:    29 years       BP:           118/71 mmHg Patient Gender: M              HR:           108 bpm. Exam Location:  Inpatient Procedure: Limited Echo, Cardiac Doppler and Limited Color Doppler Indications:    Bacteremia  History:        Patient has prior history of Echocardiogram examinations, most                 recent 10/22/2020. Sepsis; Risk Factors:Hypertension, Diabetes                 and Dyslipidemia.  Sonographer:    Johny Chess Referring Phys: [place]  1. Limited echo, poor endocardial definition. Defininty contrast not used.  2. Left ventricular ejection fraction, by estimation, is 60 to 65%. The left ventricle has normal function. Left ventricular endocardial border not optimally defined to evaluate regional wall motion. There is mild left ventricular hypertrophy. Indeterminate diastolic filling due to E-A fusion.  3. Right ventricular systolic function is hyperdynamic. The right ventricular size is normal.  4. The mitral valve is abnormal. No evidence of mitral  valve regurgitation.  5. The aortic valve is tricuspid. Aortic valve regurgitation is not visualized. Comparison(s): Changes from prior study are noted. 10/02/2020: LVEF 55-60%. Conclusion(s)/Recommendation(s): No evidence of valvular vegetations on this transthoracic echocardiogram, however, visualization of the valves was quite limited. Would recommend a transesophageal echocardiogram to exclude infective endocarditis if clinically indicated. FINDINGS  Left Ventricle: Left ventricular ejection fraction, by estimation, is 60 to 65%. The left ventricle has normal function. Left ventricular endocardial border not optimally defined to evaluate regional wall motion. The left ventricular internal cavity size was normal in size. There is mild left ventricular hypertrophy.  Indeterminate diastolic filling due to E-A fusion. Right Ventricle: The right ventricular size is normal. No increase in right ventricular wall thickness. Right ventricular systolic function is hyperdynamic. Left Atrium: Left atrial size was not well visualized. Right Atrium: Right atrial size was not well visualized. Pericardium: There is no evidence of pericardial effusion. Mitral Valve: The mitral valve is abnormal. There is mild thickening of the mitral valve leaflet(s). No evidence of mitral valve regurgitation. Tricuspid Valve: The tricuspid valve is not well visualized. Tricuspid valve regurgitation is not demonstrated. Aortic Valve: The aortic valve is tricuspid. Aortic valve regurgitation is not visualized. Pulmonic Valve: The pulmonic valve was not well visualized. Pulmonic valve regurgitation is not visualized. Aorta: The aortic arch was not well visualized and the ascending aorta was not well visualized. Venous: The inferior vena cava was not well visualized. IAS/Shunts: No atrial level shunt detected by color flow Doppler. Lyman Bishop MD Electronically signed by Lyman Bishop MD Signature Date/Time: 11/08/2020/10:13:44 AM    Final     ASSESSMENT AND PLAN: 1.  Locally recurrent pancreatic adenocarcinoma 2.  Severe sepsis secondary to liver abscess and pneumonia 3.  Subdural hematoma 4.  Acute toxic encephalopathy 5. ?  Seizure activity 6.  DKA, improved 7.  Hypertension 8.  Chronic diastolic CHF 9.  Anemia  -Once the patient recovers from his acute illness, he will be considered for radiation.  He will follow up with Dr. [name]. -Continue treatment of infection per ID. -Status post bur holes for evacuation of subdural hematoma.  Further management per neurosurgery. -Neurology following for possible seizure activity.  However, no seizures noted on EEG.  He remains on Keppra. -Monitor hemoglobin closely and transfuse for hemoglobin less than 8.    LOS: 3 days   Mikey Bussing, DNP,  AGPCNP-BC, AOCNP 11/09/20  Addendum  I have seen the patient, examined him. I agree with the assessment and and plan and have edited the notes.   Pt underwent evacuation of subdural hematoma yesterday.  He is awake, alert, and oriented.  Unfortunately due to his serious infection, we have to cancel his chemo and radiation this week.  Per ID, he would not need 6 weeks antibiotics.  We will likely postpone his radiation for 2-4 weeks. I will probably not give him concurrent chemotherapy with radiation, due to his infection risk.  I answered patient's questions, and reviewed the reason why we have to hold his radiation for recurrent pancreatic cancer, he voiced understanding. Will set up outpt f/u in about 2 weeks.   Truitt Merle  11/10/2020

## 2020-11-09 NOTE — Transfer of Care (Signed)
Immediate Anesthesia Transfer of Care Note  Patient: Colton Bush  Procedure(s) Performed: LEFT BURR HOLES FOR EVACUATION OF Subdural Hematoma (Left )  Patient Location: PACU  Anesthesia Type:General  Level of Consciousness: awake  Airway & Oxygen Therapy: Patient Spontanous Breathing and Patient connected to face mask oxygen  Post-op Assessment: Report given to RN, Post -op Vital signs reviewed and stable and Patient moving all extremities  Post vital signs: Reviewed and stable  Last Vitals:  Vitals Value Taken Time  BP 104/57 11/09/20 1346  Temp    Pulse 78 11/09/20 1350  Resp 18 11/09/20 1350  SpO2 98 % 11/09/20 1350  Vitals shown include unvalidated device data.  Last Pain:  Vitals:   11/09/20 0748  TempSrc: Axillary  PainSc: 0-No pain         Complications: No complications documented.

## 2020-11-09 NOTE — Anesthesia Procedure Notes (Signed)
Procedure Name: Intubation Date/Time: 11/09/2020 12:54 PM Performed by: Amadeo Garnet, CRNA Pre-anesthesia Checklist: Patient identified, Emergency Drugs available, Suction available and Patient being monitored Patient Re-evaluated:Patient Re-evaluated prior to induction Oxygen Delivery Method: Circle system utilized Preoxygenation: Pre-oxygenation with 100% oxygen Induction Type: IV induction Ventilation: Mask ventilation without difficulty Laryngoscope Size: Mac and 4 Grade View: Grade I Tube type: Oral Tube size: 7.5 mm Number of attempts: 1 Airway Equipment and Method: Stylet and Oral airway Placement Confirmation: ETT inserted through vocal cords under direct vision,  positive ETCO2 and breath sounds checked- equal and bilateral Secured at: 23 cm Tube secured with: Tape Dental Injury: Teeth and Oropharynx as per pre-operative assessment

## 2020-11-09 NOTE — Progress Notes (Signed)
Subjective:  Patient is concerned that his wife is not here in the hospital with him.  Antibiotics:  Anti-infectives (From admission, onward)   Start     Dose/Rate Route Frequency Ordered Stop   11/09/20 1000  [MAR Hold]  cefTRIAXone (ROCEPHIN) 2 g in sodium chloride 0.9 % 100 mL IVPB        (MAR Hold since Tue 11/09/2020 at 1122.Hold Reason: Transfer to a Procedural area.)   2 g 200 mL/hr over 30 Minutes Intravenous Every 12 hours 11/09/20 0828     11/09/20 0915  [MAR Hold]  metroNIDAZOLE (FLAGYL) tablet 500 mg        (MAR Hold since Tue 11/09/2020 at 1122.Hold Reason: Transfer to a Procedural area.)   500 mg Oral Every 8 hours 11/09/20 0828     11/07/20 2200  Ampicillin-Sulbactam (UNASYN) 3 g in sodium chloride 0.9 % 100 mL IVPB  Status:  Discontinued        3 g 200 mL/hr over 30 Minutes Intravenous Every 6 hours 11/06/20 2135 11/07/20 0943   11/07/20 1800  Ampicillin-Sulbactam (UNASYN) 3 g in sodium chloride 0.9 % 100 mL IVPB  Status:  Discontinued        3 g 200 mL/hr over 30 Minutes Intravenous Every 6 hours 11/07/20 1219 11/09/20 0828   11/07/20 1600  Ampicillin-Sulbactam (UNASYN) 3 g in sodium chloride 0.9 % 100 mL IVPB  Status:  Discontinued        3 g 200 mL/hr over 30 Minutes Intravenous Every 6 hours 11/07/20 0943 11/07/20 1219   11/07/20 1030  Ampicillin-Sulbactam (UNASYN) 3 g in sodium chloride 0.9 % 100 mL IVPB        3 g 200 mL/hr over 30 Minutes Intravenous  Once 11/07/20 0943 11/07/20 1213   11/06/20 1800  Ampicillin-Sulbactam (UNASYN) 3 g in sodium chloride 0.9 % 100 mL IVPB  Status:  Discontinued        3 g 200 mL/hr over 30 Minutes Intravenous Every 6 hours 11/06/20 1442 11/06/20 2135   11/06/20 1100  cefTRIAXone (ROCEPHIN) 2 g in sodium chloride 0.9 % 100 mL IVPB  Status:  Discontinued        2 g 200 mL/hr over 30 Minutes Intravenous Every 24 hours 11/06/20 0855 11/06/20 1435   11/06/20 0800  vancomycin (VANCOREADY) IVPB 1250 mg/250 mL  Status:   Discontinued        1,250 mg 166.7 mL/hr over 90 Minutes Intravenous Every 12 hours 11/06/20 0643 11/06/20 0854   11/06/20 0700  metroNIDAZOLE (FLAGYL) IVPB 500 mg  Status:  Discontinued        500 mg 100 mL/hr over 60 Minutes Intravenous Every 8 hours 11/06/20 0638 11/06/20 1452   11/06/20 0400  ceFEPIme (MAXIPIME) 2 g in sodium chloride 0.9 % 100 mL IVPB  Status:  Discontinued        2 g 200 mL/hr over 30 Minutes Intravenous Every 8 hours 11/05/20 2358 11/06/20 0853   11/05/20 1845  vancomycin (VANCOREADY) IVPB 1500 mg/300 mL        1,500 mg 150 mL/hr over 120 Minutes Intravenous  Once 11/05/20 1830 11/05/20 2237   11/05/20 1830  ceFEPIme (MAXIPIME) 2 g in sodium chloride 0.9 % 100 mL IVPB        2 g 200 mL/hr over 30 Minutes Intravenous  Once 11/05/20 1824 11/05/20 2103   11/05/20 1830  metroNIDAZOLE (FLAGYL) IVPB 500 mg  500 mg 100 mL/hr over 60 Minutes Intravenous  Once 11/05/20 1824 11/05/20 2140   11/05/20 1830  vancomycin (VANCOCIN) IVPB 1000 mg/200 mL premix  Status:  Discontinued        1,000 mg 200 mL/hr over 60 Minutes Intravenous  Once 11/05/20 1824 11/05/20 1830      Medications: Scheduled Meds: . [MAR Hold] Chlorhexidine Gluconate Cloth  6 each Topical Daily  . [MAR Hold] insulin aspart  0-9 Units Subcutaneous Q4H  . [MAR Hold] insulin detemir  10 Units Subcutaneous Daily  . [MAR Hold] ipratropium  0.5 mg Nebulization BID  . [MAR Hold] levalbuterol  0.63 mg Nebulization BID  . [MAR Hold] lipase/protease/amylase  12,000 Units Oral TID with meals  . [MAR Hold] loratadine  10 mg Oral Daily  . [MAR Hold] mouth rinse  15 mL Mouth Rinse BID  . [MAR Hold] metroNIDAZOLE  500 mg Oral Q8H  . [MAR Hold] pantoprazole (PROTONIX) IV  40 mg Intravenous Q24H  . [MAR Hold] sodium chloride flush  5 mL Intracatheter Q8H  . [MAR Hold] tamsulosin  0.4 mg Oral Daily   Continuous Infusions: . sodium chloride 10 mL/hr at 11/09/20 1201  . acetaminophen    . [MAR Hold]  cefTRIAXone (ROCEPHIN)  IV 2 g (11/09/20 0909)  . dextrose 5% lactated ringers 75 mL/hr at 11/09/20 0033  . lactated ringers    . [MAR Hold] levETIRAcetam Stopped (11/09/20 0714)  . potassium chloride     PRN Meds:.acetaminophen, acetaminophen **OR** acetaminophen (TYLENOL) oral liquid 160 mg/5 mL, [MAR Hold] acetaminophen **OR** [MAR Hold] acetaminophen, [MAR Hold] dextrose, fentaNYL (SUBLIMAZE) injection, [MAR Hold] hydrALAZINE, [MAR Hold] LORazepam, meperidine (DEMEROL) injection, [MAR Hold] ondansetron **OR** [MAR Hold] ondansetron (ZOFRAN) IV, [MAR Hold] polyethylene glycol    Objective: Weight change:   Intake/Output Summary (Last 24 hours) at 11/09/2020 1438 Last data filed at 11/09/2020 1336 Gross per 24 hour  Intake 4249.46 ml  Output 3010 ml  Net 1239.46 ml   Blood pressure (!) 108/56, pulse 78, temperature 97.6 F (36.4 C), resp. rate 19, height 5\' 10"  (1.778 m), weight 86.3 kg, SpO2 93 %. Temp:  [97.5 F (36.4 C)-98.8 F (37.1 C)] 97.6 F (36.4 C) (04/12 1346) Pulse Rate:  [70-86] 78 (04/12 1431) Resp:  [16-22] 19 (04/12 1431) BP: (104-142)/(52-77) 108/56 (04/12 1431) SpO2:  [90 %-99 %] 93 % (04/12 1431) Weight:  [86.3 kg] 86.3 kg (04/11 2103)  Physical Exam: Physical Exam Constitutional:      Appearance: He is well-developed.  HENT:     Head: Normocephalic and atraumatic.  Eyes:     Conjunctiva/sclera: Conjunctivae normal.  Cardiovascular:     Rate and Rhythm: Normal rate and regular rhythm.     Heart sounds: No murmur heard. No friction rub. No gallop.   Pulmonary:     Effort: Pulmonary effort is normal. No respiratory distress.     Breath sounds: No stridor. No wheezing or rhonchi.  Abdominal:     General: There is no distension.     Palpations: Abdomen is soft.     Tenderness: There is no abdominal tenderness. There is no guarding.  Musculoskeletal:        General: Normal range of motion.     Cervical back: Normal range of motion and neck supple.   Skin:    General: Skin is warm and dry.     Findings: No erythema or rash.  Neurological:     General: No focal deficit present.     Mental  Status: He is alert.  Psychiatric:        Speech: Speech is delayed.        Behavior: Behavior normal.        Thought Content: Thought content normal.     JP drains x 2  CBC:    BMET Recent Labs    11/09/20 0132 11/09/20 0801  NA 136 137  K 3.2* 3.0*  CL 103 101  CO2 27 28  GLUCOSE 140* 179*  BUN 5* <5*  CREATININE 0.44* 0.42*  CALCIUM 7.5* 7.7*     Liver Panel  Recent Labs    11/08/20 0237 11/09/20 0132  PROT 6.2* 5.6*  ALBUMIN 2.1* 1.7*  AST 50* 27  ALT 40 31  ALKPHOS 258* 234*  BILITOT 0.7 0.6       Sedimentation Rate No results for input(s): ESRSEDRATE in the last 72 hours. C-Reactive Protein No results for input(s): CRP in the last 72 hours.  Micro Results: Recent Results (from the past 720 hour(s))  Resp Panel by RT-PCR (Flu A&B, Covid) Nasopharyngeal Swab     Status: None   Collection Time: 10/18/20  7:11 PM   Specimen: Nasopharyngeal Swab; Nasopharyngeal(NP) swabs in vial transport medium  Result Value Ref Range Status   SARS Coronavirus 2 by RT PCR NEGATIVE NEGATIVE Final    Comment: (NOTE) SARS-CoV-2 target nucleic acids are NOT DETECTED.  The SARS-CoV-2 RNA is generally detectable in upper respiratory specimens during the acute phase of infection. The lowest concentration of SARS-CoV-2 viral copies this assay can detect is 138 copies/mL. A negative result does not preclude SARS-Cov-2 infection and should not be used as the sole basis for treatment or other patient management decisions. A negative result may occur with  improper specimen collection/handling, submission of specimen other than nasopharyngeal swab, presence of viral mutation(s) within the areas targeted by this assay, and inadequate number of viral copies(<138 copies/mL). A negative result must be combined with clinical  observations, patient history, and epidemiological information. The expected result is Negative.  Fact Sheet for Patients:  EntrepreneurPulse.com.au  Fact Sheet for Healthcare Providers:  IncredibleEmployment.be  This test is no t yet approved or cleared by the [place] FDA and  has been authorized for detection and/or diagnosis of SARS-CoV-2 by FDA under an Emergency Use Authorization (EUA). This EUA will remain  in effect (meaning this test can be used) for the duration of the COVID-19 declaration under Section 564(b)(1) of the Act, 21 U.S.C.section 360bbb-3(b)(1), unless the authorization is terminated  or revoked sooner.       Influenza A by PCR NEGATIVE NEGATIVE Final   Influenza B by PCR NEGATIVE NEGATIVE Final    Comment: (NOTE) The Xpert Xpress SARS-CoV-2/FLU/RSV plus assay is intended as an aid in the diagnosis of influenza from Nasopharyngeal swab specimens and should not be used as a sole basis for treatment. Nasal washings and aspirates are unacceptable for Xpert Xpress SARS-CoV-2/FLU/RSV testing.  Fact Sheet for Patients: EntrepreneurPulse.com.au  Fact Sheet for Healthcare Providers: IncredibleEmployment.be  This test is not yet approved or cleared by the [place] FDA and has been authorized for detection and/or diagnosis of SARS-CoV-2 by FDA under an Emergency Use Authorization (EUA). This EUA will remain in effect (meaning this test can be used) for the duration of the COVID-19 declaration under Section 564(b)(1) of the Act, 21 U.S.C. section 360bbb-3(b)(1), unless the authorization is terminated or revoked.  Performed at [facility], [place] [address], [place], [place] [address]  Surgical pcr screen     Status: Abnormal   Collection Time: 10/19/20 12:47 AM   Specimen: Nasal Mucosa; Nasal Swab  Result Value Ref Range Status   MRSA, PCR NEGATIVE NEGATIVE  Final   Staphylococcus aureus POSITIVE (A) NEGATIVE Final    Comment: RESULT CALLED TO, READ BACK BY AND VERIFIED WITH: BOBBY, RN @ [phone] ON 10/19/20 C VARNER (NOTE) The Xpert SA Assay (FDA approved for NASAL specimens in patients 22 years of age and older), is one component of a comprehensive surveillance program. It is not intended to diagnose infection nor to guide or monitor treatment. Performed at [facility], [place], [place] [address] [place], [place] [address]   Culture, blood (routine x 2)     Status: None   Collection Time: 10/20/20  7:55 AM   Specimen: BLOOD  Result Value Ref Range Status   Specimen Description   Final    BLOOD RIGHT ANTECUBITAL Performed at [place] [address], [place], [place] [address]    Special Requests   Final    BOTTLES DRAWN AEROBIC AND ANAEROBIC Blood Culture results may not be optimal due to an inadequate volume of blood received in culture bottles Performed at [place] [address] [place], [place] [address]    Culture   Final    NO GROWTH 5 DAYS Performed at [facility], [place] [address] [place], [place] [address]    Report Status 10/25/2020 FINAL  Final  Culture, blood (routine x 2)     Status: Abnormal   Collection Time: 10/20/20  7:56 AM   Specimen: BLOOD  Result Value Ref Range Status   Specimen Description   Final    BLOOD RIGHT ANTECUBITAL Performed at [place] [address] [place], [place] [address]    Special Requests   Final    BOTTLES DRAWN AEROBIC AND ANAEROBIC Blood Culture results may not be optimal due to an inadequate volume of blood received in culture bottles Performed at [place] [address], [place], [place] [address]    Culture  Setup Time   Final    AEROBIC BOTTLE ONLY GRAM POSITIVE COCCI CRITICAL RESULT CALLED TO, READ BACK BY AND VERIFIED WITHMelodye Ped [facility] 9702 10/21/20 A  BROWNING Performed at [facility], [place] [address], [place], [place] [address]    Culture STREPTOCOCCUS ANGINOSIS (A)  Final   Report Status 10/24/2020 FINAL  Final   Organism ID, Bacteria STREPTOCOCCUS ANGINOSIS  Final      Susceptibility   Streptococcus anginosis - MIC*    PENICILLIN <=0.06 SENSITIVE Sensitive     CEFTRIAXONE 0.5 SENSITIVE Sensitive     ERYTHROMYCIN <=0.12 SENSITIVE Sensitive     LEVOFLOXACIN 0.5 SENSITIVE Sensitive     VANCOMYCIN 1 SENSITIVE Sensitive     * STREPTOCOCCUS ANGINOSIS  Blood Culture ID Panel (Reflexed)     Status: Abnormal   Collection Time: 10/20/20  7:56 AM  Result Value Ref Range Status   Enterococcus faecalis NOT DETECTED NOT DETECTED Final   Enterococcus Faecium NOT DETECTED NOT DETECTED Final   Listeria monocytogenes NOT DETECTED NOT DETECTED Final   Staphylococcus species NOT DETECTED NOT DETECTED Final   Staphylococcus aureus (BCID) NOT DETECTED NOT DETECTED Final   Staphylococcus epidermidis NOT DETECTED NOT DETECTED Final   Staphylococcus lugdunensis NOT DETECTED NOT DETECTED Final   Streptococcus species DETECTED (A) NOT DETECTED Final    Comment: Not Enterococcus  species, Streptococcus agalactiae, Streptococcus pyogenes, or Streptococcus pneumoniae. CRITICAL RESULT CALLED TO, READ BACK BY AND VERIFIED WITH: Melodye Ped PHARMD 1316 10/21/20 A BROWNING    Streptococcus agalactiae NOT DETECTED NOT DETECTED Final   Streptococcus pneumoniae NOT DETECTED NOT DETECTED Final   Streptococcus pyogenes NOT DETECTED NOT DETECTED Final   A.calcoaceticus-baumannii NOT DETECTED NOT DETECTED Final   Bacteroides fragilis NOT DETECTED NOT DETECTED Final   Enterobacterales NOT DETECTED NOT DETECTED Final   Enterobacter cloacae complex NOT DETECTED NOT DETECTED Final   Escherichia coli NOT DETECTED NOT DETECTED Final   Klebsiella aerogenes NOT DETECTED NOT DETECTED Final   Klebsiella oxytoca NOT DETECTED NOT DETECTED Final   Klebsiella pneumoniae NOT  DETECTED NOT DETECTED Final   Proteus species NOT DETECTED NOT DETECTED Final   Salmonella species NOT DETECTED NOT DETECTED Final   Serratia marcescens NOT DETECTED NOT DETECTED Final   Haemophilus influenzae NOT DETECTED NOT DETECTED Final   Neisseria meningitidis NOT DETECTED NOT DETECTED Final   Pseudomonas aeruginosa NOT DETECTED NOT DETECTED Final   Stenotrophomonas maltophilia NOT DETECTED NOT DETECTED Final   Candida albicans NOT DETECTED NOT DETECTED Final   Candida auris NOT DETECTED NOT DETECTED Final   Candida glabrata NOT DETECTED NOT DETECTED Final   Candida krusei NOT DETECTED NOT DETECTED Final   Candida parapsilosis NOT DETECTED NOT DETECTED Final   Candida tropicalis NOT DETECTED NOT DETECTED Final   Cryptococcus neoformans/gattii NOT DETECTED NOT DETECTED Final    Comment: Performed at [facility], [address], [place], [place] [address]  Culture, Urine     Status: Abnormal   Collection Time: 10/20/20 10:25 AM   Specimen: Urine, Clean Catch  Result Value Ref Range Status   Specimen Description   Final    URINE, CLEAN CATCH Performed at [facility], [place] [address] [place], [place] [address]    Special Requests   Final    NONE Performed at [facility], [place] [address], [place], [place] [address]    Culture >=100,000 COLONIES/mL ESCHERICHIA COLI (A)  Final   Report Status 10/23/2020 FINAL  Final   Organism ID, Bacteria ESCHERICHIA COLI (A)  Final      Susceptibility   Escherichia coli - MIC*    AMPICILLIN 4 SENSITIVE Sensitive     CEFAZOLIN <=4 SENSITIVE Sensitive     CEFEPIME <=0.12 SENSITIVE Sensitive     CEFTRIAXONE <=0.25 SENSITIVE Sensitive     CIPROFLOXACIN <=0.25 SENSITIVE Sensitive     GENTAMICIN <=1 SENSITIVE Sensitive     IMIPENEM <=0.25 SENSITIVE Sensitive     NITROFURANTOIN <=16 SENSITIVE Sensitive     TRIMETH/SULFA <=20 SENSITIVE Sensitive     AMPICILLIN/SULBACTAM <=2 SENSITIVE Sensitive      PIP/TAZO <=4 SENSITIVE Sensitive     * >=100,000 COLONIES/mL ESCHERICHIA COLI  Culture, blood (routine x 2)     Status: None   Collection Time: 10/22/20  7:26 AM   Specimen: BLOOD  Result Value Ref Range Status   Specimen Description   Final    BLOOD LEFT ANTECUBITAL Performed at [place] [address] [place], [place] [address]    Special Requests   Final    BOTTLES DRAWN AEROBIC ONLY Blood Culture results may not be optimal due to an inadequate volume of blood received in culture bottles Performed at [place] [address], [place], [place] [address]    Culture   Final    NO GROWTH 5  DAYS Performed at [facility], [place] [address], [place], [place] [address]    Report Status 10/27/2020 FINAL  Final  Culture, blood (routine x 2)     Status: None   Collection Time: 10/22/20  7:26 AM   Specimen: BLOOD  Result Value Ref Range Status   Specimen Description   Final    BLOOD LEFT ANTECUBITAL Performed at [place] [address], [place], [place] [address]    Special Requests   Final    BOTTLES DRAWN AEROBIC ONLY Blood Culture results may not be optimal due to an inadequate volume of blood received in culture bottles Performed at [place] [address], [place], [place] [address]    Culture   Final    NO GROWTH 5 DAYS Performed at [facility], [place] [address] [place], [place] [address]    Report Status 10/27/2020 FINAL  Final  SARS CORONAVIRUS 2 (TAT 6-24 HRS) Nasopharyngeal Nasopharyngeal Swab     Status: None   Collection Time: 10/27/20  1:19 PM   Specimen: Nasopharyngeal Swab  Result Value Ref Range Status   SARS Coronavirus 2 NEGATIVE NEGATIVE Final    Comment: (NOTE) SARS-CoV-2 target nucleic acids are NOT DETECTED.  The SARS-CoV-2 RNA is generally detectable in upper and lower respiratory specimens during the acute phase of infection. Negative results do not  preclude SARS-CoV-2 infection, do not rule out co-infections with other pathogens, and should not be used as the sole basis for treatment or other patient management decisions. Negative results must be combined with clinical observations, patient history, and epidemiological information. The expected result is Negative.  Fact Sheet for Patients: SugarRoll.be  Fact Sheet for Healthcare Providers: https://www.woods-mathews.com/  This test is not yet approved or cleared by the [place] FDA and  has been authorized for detection and/or diagnosis of SARS-CoV-2 by FDA under an Emergency Use Authorization (EUA). This EUA will remain  in effect (meaning this test can be used) for the duration of the COVID-19 declaration under Se ction 564(b)(1) of the Act, 21 U.S.C. section 360bbb-3(b)(1), unless the authorization is terminated or revoked sooner.  Performed at [facility], [place] [address] [place], [place] [address]   Resp Panel by RT-PCR (Flu A&B, Covid) Nasopharyngeal Swab     Status: None   Collection Time: 11/05/20  7:14 PM   Specimen: Nasopharyngeal Swab; Nasopharyngeal(NP) swabs in vial transport medium  Result Value Ref Range Status   SARS Coronavirus 2 by RT PCR NEGATIVE NEGATIVE Final    Comment: (NOTE) SARS-CoV-2 target nucleic acids are NOT DETECTED.  The SARS-CoV-2 RNA is generally detectable in upper respiratory specimens during the acute phase of infection. The lowest concentration of SARS-CoV-2 viral copies this assay can detect is 138 copies/mL. A negative result does not preclude SARS-Cov-2 infection and should not be used as the sole basis for treatment or other patient management decisions. A negative result may occur with  improper specimen collection/handling, submission of specimen other than nasopharyngeal swab, presence of viral mutation(s) within the areas targeted by this assay, and inadequate number of  viral copies(<138 copies/mL). A negative result must be combined with clinical observations, patient history, and epidemiological information. The expected result is Negative.  Fact Sheet for Patients:  EntrepreneurPulse.com.au  Fact Sheet for Healthcare Providers:  IncredibleEmployment.be  This test is no t yet approved or cleared by the [place] FDA and  has been authorized for detection and/or diagnosis of SARS-CoV-2  by FDA under an Emergency Use Authorization (EUA). This EUA will remain  in effect (meaning this test can be used) for the duration of the COVID-19 declaration under Section 564(b)(1) of the Act, 21 U.S.C.section 360bbb-3(b)(1), unless the authorization is terminated  or revoked sooner.       Influenza A by PCR NEGATIVE NEGATIVE Final   Influenza B by PCR NEGATIVE NEGATIVE Final    Comment: (NOTE) The Xpert Xpress SARS-CoV-2/FLU/RSV plus assay is intended as an aid in the diagnosis of influenza from Nasopharyngeal swab specimens and should not be used as a sole basis for treatment. Nasal washings and aspirates are unacceptable for Xpert Xpress SARS-CoV-2/FLU/RSV testing.  Fact Sheet for Patients: EntrepreneurPulse.com.au  Fact Sheet for Healthcare Providers: IncredibleEmployment.be  This test is not yet approved or cleared by the [place] FDA and has been authorized for detection and/or diagnosis of SARS-CoV-2 by FDA under an Emergency Use Authorization (EUA). This EUA will remain in effect (meaning this test can be used) for the duration of the COVID-19 declaration under Section 564(b)(1) of the Act, 21 U.S.C. section 360bbb-3(b)(1), unless the authorization is terminated or revoked.  Performed at [facility], [place] [address] [place], [place] [address]   Blood Culture (routine x 2)     Status: None (Preliminary result)   Collection Time: 11/05/20  8:36  PM   Specimen: BLOOD  Result Value Ref Range Status   Specimen Description   Final    BLOOD BLOOD RIGHT FOREARM Performed at [place] [address], [place], [place] [address]    Special Requests   Final    BOTTLES DRAWN AEROBIC AND ANAEROBIC Blood Culture results may not be optimal due to an inadequate volume of blood received in culture bottles Performed at [place] [address], [place], [place] [address]    Culture   Final    NO GROWTH 3 DAYS Performed at [facility], [place] [address], [place], [place] [address]    Report Status PENDING  Incomplete  Blood Culture (routine x 2)     Status: None (Preliminary result)   Collection Time: 11/05/20  8:38 PM   Specimen: BLOOD  Result Value Ref Range Status   Specimen Description   Final    BLOOD Performed at [place] [address], [place], [place] [address]    Special Requests   Final    BOTTLES DRAWN AEROBIC AND ANAEROBIC Blood Culture results may not be optimal due to an inadequate volume of blood received in culture bottles Performed at [place] [address] [place], [place] [address]    Culture   Final    NO GROWTH 3 DAYS Performed at [facility], [place] [address] [place], [place] [address]    Report Status PENDING  Incomplete  Urine culture     Status: Abnormal   Collection Time: 11/05/20  8:38 PM   Specimen: In/Out Cath Urine  Result Value Ref Range Status   Specimen Description   Final    IN/OUT CATH URINE Performed at [place] [address], [place], [place] [address]    Special Requests   Final    NONE Performed at [facility], [place] [address], [place], [place] [address]    Culture 60,000 COLONIES/mL ESCHERICHIA COLI (A)  Final   Report Status 11/08/2020 FINAL  Final   Organism ID, Bacteria ESCHERICHIA COLI (A)  Final  Susceptibility   Escherichia coli  - MIC*    AMPICILLIN 4 SENSITIVE Sensitive     CEFAZOLIN <=4 SENSITIVE Sensitive     CEFEPIME <=0.12 SENSITIVE Sensitive     CEFTRIAXONE <=0.25 SENSITIVE Sensitive     CIPROFLOXACIN <=0.25 SENSITIVE Sensitive     GENTAMICIN <=1 SENSITIVE Sensitive     IMIPENEM <=0.25 SENSITIVE Sensitive     NITROFURANTOIN <=16 SENSITIVE Sensitive     TRIMETH/SULFA <=20 SENSITIVE Sensitive     AMPICILLIN/SULBACTAM <=2 SENSITIVE Sensitive     PIP/TAZO <=4 SENSITIVE Sensitive     * 60,000 COLONIES/mL ESCHERICHIA COLI  MRSA PCR Screening     Status: None   Collection Time: 11/06/20  8:42 AM   Specimen: Nasopharyngeal  Result Value Ref Range Status   MRSA by PCR NEGATIVE NEGATIVE Final    Comment:        The GeneXpert MRSA Assay (FDA approved for NASAL specimens only), is one component of a comprehensive MRSA colonization surveillance program. It is not intended to diagnose MRSA infection nor to guide or monitor treatment for MRSA infections. Performed at [facility], [place] [address], [place], [place] [address]   Aerobic/Anaerobic Culture (surgical/deep wound)     Status: None (Preliminary result)   Collection Time: 11/06/20  3:45 PM   Specimen: Liver; Abscess  Result Value Ref Range Status   Specimen Description   Final    LIVER Performed at [facility] [address] [place], [place] [address]    Special Requests   Final    NONE Performed at [facility], [place] [address] [place], [place] [address]    Gram Stain   Final    FEW GRAM VARIABLE ROD Performed at [facility], [place] [address], [place], [place] [address]    Culture   Final    ABUNDANT KLEBSIELLA PNEUMONIAE ABUNDANT ESCHERICHIA COLI NO ANAEROBES ISOLATED; CULTURE IN PROGRESS FOR 5 DAYS    Report Status PENDING  Incomplete   Organism ID, Bacteria KLEBSIELLA PNEUMONIAE  Final   Organism ID, Bacteria ESCHERICHIA COLI  Final      Susceptibility   Escherichia coli  - MIC*    AMPICILLIN <=2 SENSITIVE Sensitive     CEFAZOLIN <=4 SENSITIVE Sensitive     CEFEPIME <=0.12 SENSITIVE Sensitive     CEFTAZIDIME <=1 SENSITIVE Sensitive     CEFTRIAXONE <=0.25 SENSITIVE Sensitive     CIPROFLOXACIN <=0.25 SENSITIVE Sensitive     GENTAMICIN <=1 SENSITIVE Sensitive     IMIPENEM <=0.25 SENSITIVE Sensitive     TRIMETH/SULFA <=20 SENSITIVE Sensitive     AMPICILLIN/SULBACTAM <=2 SENSITIVE Sensitive     PIP/TAZO <=4 SENSITIVE Sensitive     * ABUNDANT ESCHERICHIA COLI   Klebsiella pneumoniae - MIC*    AMPICILLIN >=32 RESISTANT Resistant     CEFAZOLIN <=4 SENSITIVE Sensitive     CEFEPIME <=0.12 SENSITIVE Sensitive     CEFTAZIDIME <=1 SENSITIVE Sensitive     CEFTRIAXONE <=0.25 SENSITIVE Sensitive     CIPROFLOXACIN <=0.25 SENSITIVE Sensitive     GENTAMICIN <=1 SENSITIVE Sensitive     IMIPENEM <=0.25 SENSITIVE Sensitive     TRIMETH/SULFA <=20 SENSITIVE Sensitive     AMPICILLIN/SULBACTAM 16 INTERMEDIATE Intermediate     PIP/TAZO 16 SENSITIVE Sensitive     * ABUNDANT KLEBSIELLA PNEUMONIAE  Aerobic/Anaerobic Culture w Gram Stain (surgical/deep wound)     Status: None (Preliminary result)   Collection Time: 11/06/20  3:46 PM   Specimen: Liver; Abscess  Result Value Ref Range Status   Specimen Description LIVER ABSCESS  Final   Special Requests MORE INFERIOR RIGHT LIVER  Final   Gram Stain   Final    FEW WBC PRESENT, PREDOMINANTLY PMN FEW GRAM NEGATIVE RODS RARE GRAM POSITIVE COCCI IN PAIRS Performed at [facility], [place] [address], [place], [place] [address]    Culture   Final    ABUNDANT ESCHERICHIA COLI ABUNDANT STREPTOCOCCUS ANGINOSIS SUSCEPTIBILITIES TO FOLLOW NO ANAEROBES ISOLATED; CULTURE IN PROGRESS FOR 5 DAYS    Report Status PENDING  Incomplete   Organism ID, Bacteria ESCHERICHIA COLI  Final      Susceptibility   Escherichia coli - MIC*    AMPICILLIN 4 SENSITIVE Sensitive     CEFAZOLIN <=4 SENSITIVE Sensitive     CEFEPIME <=0.12 SENSITIVE  Sensitive     CEFTAZIDIME <=1 SENSITIVE Sensitive     CEFTRIAXONE <=0.25 SENSITIVE Sensitive     CIPROFLOXACIN <=0.25 SENSITIVE Sensitive     GENTAMICIN <=1 SENSITIVE Sensitive     IMIPENEM <=0.25 SENSITIVE Sensitive     TRIMETH/SULFA <=20 SENSITIVE Sensitive     AMPICILLIN/SULBACTAM <=2 SENSITIVE Sensitive     PIP/TAZO <=4 SENSITIVE Sensitive     * ABUNDANT ESCHERICHIA COLI    Studies/Results: CT HEAD WO CONTRAST  Result Date: 11/07/2020 CLINICAL DATA:  Subdural hematoma EXAM: CT HEAD WITHOUT CONTRAST TECHNIQUE: Contiguous axial images were obtained from the base of the skull through the vertex without intravenous contrast. COMPARISON:  Brain MRI same day FINDINGS: Brain: Left holohemispheric subdural collection is diffusely hypodense and measures 10 mm in thickness. Mass effect on the left hemisphere with only trace midline shift. No hydrocephalus. No intraparenchymal hemorrhage. Vascular: No hyperdense vessel or unexpected calcification. Skull: Normal. Negative for fracture or focal lesion. Sinuses/Orbits: No acute finding. Other: None. IMPRESSION: Left holohemispheric subdural collection measuring 10 mm in thickness with mild mass effect on the left hemisphere and only trace midline shift. This is most consistent with a late subacute or chronic subdural hematoma. Electronically Signed   By: Ulyses Jarred M.D.   On: 11/07/2020 21:58   MR BRAIN WO CONTRAST  Addendum Date: 11/07/2020   ADDENDUM REPORT: 11/07/2020 20:54 ADDENDUM: Critical Value/emergent results were called by telephone at the time of interpretation on 11/07/2020 at 8:54 pm to provider Lovey Newcomer , who verbally acknowledged these results. Electronically Signed   By: Ulyses Jarred M.D.   On: 11/07/2020 20:54   Result Date: 11/07/2020 CLINICAL DATA:  Altered mental status EXAM: MRI HEAD WITHOUT CONTRAST TECHNIQUE: Multiplanar, multiecho pulse sequences of the brain and surrounding structures were obtained without intravenous  contrast. COMPARISON:  None. FINDINGS: Brain: Left holo hemispheric extra-axial collection, likely hematoma, measuring 10 mm in thickness. Mild mass effect on the left hemisphere with minimal midline shift. There is some subarachnoid blood over the left frontal lobe. Areas of abnormal diffusion restriction in the left temporal and parietal lobes. No intraparenchymal hemorrhage. There is multifocal hyperintense T2-weighted signal within the white matter. Generalized volume loss without a clear lobar predilection. The midline structures are normal. Vascular: Major flow voids are preserved. Skull and upper cervical spine: Normal calvarium and skull base. Visualized upper cervical spine and soft tissues are normal. Sinuses/Orbits:No paranasal sinus fluid levels or advanced mucosal thickening. No mastoid or middle ear effusion. Normal orbits. IMPRESSION: 1. Age indeterminate, mixed left subarachnoid and subdural hematoma measuring 10 mm in thickness. Head CT recommended for better temporal characterization. 2. Areas of suspected acute ischemia in the  left temporal and parietal lobes, though diffusion-weighted imaging is complicated by the adjacent blood products and their magnetic susceptibility effects. Electronically Signed: By: Ulyses Jarred M.D. On: 11/07/2020 20:33   DG CHEST PORT 1 VIEW  Result Date: 11/08/2020 CLINICAL DATA:  Dyspnea EXAM: PORTABLE CHEST 1 VIEW COMPARISON:  11/07/2020 FINDINGS: Mild right basilar atelectasis or infiltrate again noted, better appreciated on prior CT examination of 11/06/2020. Mild right-sided volume loss is unchanged. No pneumothorax or pleural effusion. Cardiac size within normal limits. Pulmonary vascularity is normal. Pigtail catheter noted overlying the right upper quadrant. IMPRESSION: Stable right-sided volume loss and mild right basilar atelectasis or infiltrate. Electronically Signed   By: Fidela Salisbury MD   On: 11/08/2020 06:57   DG Swallowing Func-Speech  Pathology  Result Date: 11/08/2020 Objective Swallowing Evaluation: Type of Study: MBS-Modified Barium Swallow Study  Patient Details Name: KRIS NO MRN: [id number] Date of Birth: 07/31/1956 Today's Date: 11/08/2020 Time: SLP Start Time (ACUTE ONLY): 7782 -SLP Stop Time (ACUTE ONLY): 1055 SLP Time Calculation (min) (ACUTE ONLY): 15 min Past Medical History: Past Medical History: Diagnosis Date . Arthritis   left hand . Bronchitis 1977 . Cancer ([place]) 03/09/2016  pancreatic cancer, sees Dr. Cristino Martes at [facility]  . Depression   takes Cymbalta daily . Diabetes mellitus type II   sees Dr. Chalmers Cater  . GERD (gastroesophageal reflux disease)   takes Omeprazole daily . H/O hiatal hernia  . Hyperlipidemia   takes Zocor daily . Hypertension   takes Amlodipine daily . Hypoglycemia 06/18/2017 . Neck pain   C4-7 stenosis and herniated disc . Neuromuscular disorder ([place])   hiatal hernia . Scoliosis   slight . Spinal cord injury, C5-C7 ([place])   c4-c7 . Stiffness of hand joint   d/t cervical issues Past Surgical History: Past Surgical History: Procedure Laterality Date . ANTERIOR CERVICAL DECOMP/DISCECTOMY FUSION  08/18/2011  Procedure: ANTERIOR CERVICAL DECOMPRESSION/DISCECTOMY FUSION 3 LEVELS;  Surgeon: Winfield Cunas, MD;  Location: [facility];  Service: Neurosurgery;  Laterality: N/A;  Anterior Cervical Four-Five/Five-Six/Six-Seven Decompression with Fusion, Plating, and Bonegraft . CARPAL TUNNEL RELEASE  2013  bilateral, per Dr. Christella Noa  . COLONOSCOPY  10-30-14  per Dr. Olevia Perches, clear, repeat in 10 yrs  . egd with esophageal dilation  9-08  per Dr. Olevia Perches . ERCP N/A 03/01/2016  Procedure: ENDOSCOPIC RETROGRADE CHOLANGIOPANCREATOGRAPHY (ERCP) with brushings and stent;  Surgeon: Doran Stabler, MD;  Location: [facility];  Service: Endoscopy;  Laterality: N/A; . ESOPHAGOGASTRODUODENOSCOPY (EGD) WITH PROPOFOL N/A 05/27/2020  Procedure: ESOPHAGOGASTRODUODENOSCOPY (EGD) WITH PROPOFOL;  Surgeon: Milus Banister, MD;  Location: [facility];  Service: Endoscopy;  Laterality: N/A; . EUS N/A 03/09/2016  Procedure: ESOPHAGEAL ENDOSCOPIC ULTRASOUND (EUS) RADIAL;  Surgeon: Milus Banister, MD;  Location: [facility];  Service: Endoscopy;  Laterality: N/A; . EUS N/A 05/27/2020  Procedure: UPPER ENDOSCOPIC ULTRASOUND (EUS) RADIAL;  Surgeon: Milus Banister, MD;  Location: [facility];  Service: Endoscopy;  Laterality: N/A; . FEMUR IM NAIL Right 10/25/2020  Procedure: INTRAMEDULLARY (IM) NAIL FEMORAL;  Surgeon: Rod Can, MD;  Location: [facility];  Service: Orthopedics;  Laterality: Right; . lymph nodes biopsy   . melanoma rt calf  1999 . PORT-A-CATH REMOVAL N/A 04/03/2019  Procedure: PORT REMOVAL;  Surgeon: Stark Klein, MD;  Location: [facility];  Service: General;  Laterality: N/A; . PORTACATH PLACEMENT Left 03/22/2016  Procedure: INSERTION PORT-A-CATH;  Surgeon: Stark Klein, MD;  Location: [facility];  Service: General;  Laterality: Left; . SPINE SURGERY   .  TONSILLECTOMY    as a child . ULNAR TUNNEL RELEASE  2013  right arm, per Dr. Christella Noa  . UPPER GASTROINTESTINAL ENDOSCOPY   . WHIPPLE PROCEDURE N/A 09/19/2016  Procedure: DIAGNOSTIC LAPAROSCOPY, LAPAROSCOPIC LIVER BIOPSY, RETROPERITONEAL EXPLORATION, INTRAOPERATIVE ULTRASOUND;  Surgeon: Stark Klein, MD;  Location: [facility];  Service: General;  Laterality: N/A; HPI: Patient is a 65 year old Caucasian male with a past medical history significant for but not limited to pancreatic cancer diagnosed in 2017 with recent recurrence, diabetes mellitus type 2, hypertension, hyperlipidemia, chronic diastolic CHF with an EF of 55 to 60% and grade 1 diastolic dysfunction back in March 2022, recent right hip fracture status post repair and intramedullary nail as well as other comorbidities who presented to [facility] ED with a chief complaint of a sudden change in mental status. In the ED is found to be septic and initial source was thought to be another UTI but then further work-up revealed that he had a liver  abscess.  2 drains placed in his liver abscess  Subjective: alert but drowsy Assessment / Plan / Recommendation CHL IP CLINICAL IMPRESSIONS 11/08/2020 Clinical Impression Patient presents with a mild-mod oropharyngeal dysphagia with likely impact from current level of cognitive delays and decreased overall alertness. During oral phase, patient exhibited delays in anterior to posterior transit of thin liquids and puree solids boluses and with barium tablet in puree, exhibited mild premature spillage of puree into vallecular sinus likely due to decreased bolus cohesion. trace to mild oral residuals post initial swallow which eventually cleared with uncued subsequent swallows. During pharyngeal phase of swallow, patient exhibited min vallecular residuals with puree solids, trace to min vallecular, pyriform and posterior pharynx residuals with thin liquids. Residuals were observed to fully clear pharynx during course of the study. Flash penetration was observed with thin liquids via straw sips and cup sips, however penetrate remained above vocal cords and fully cleared laryngeal vestibule. Barium tablet transited throught pharynx without difficulty. Slight narrowing of esophagus observed at level of approximately C4-6 where he has cervical hardware from h/o surgery. This narrowing did not result in significant stasis of boluses or significantly impact pharyngeal and upper esophageal transit. SLP recommending to initiate full liquids diet and with plan to work on upgraded solids trials at bedside. SLP Visit Diagnosis -- Attention and concentration deficit following -- Frontal lobe and executive function deficit following -- Impact on safety and function --   CHL IP TREATMENT RECOMMENDATION 11/08/2020 Treatment Recommendations Therapy as outlined in treatment plan below   Prognosis 11/08/2020 Prognosis for Safe Diet Advancement Good Barriers to Reach Goals -- Barriers/Prognosis Comment -- CHL IP DIET RECOMMENDATION 11/08/2020  SLP Diet Recommendations Other (Comment);Thin liquid Liquid Administration via Cup;Straw Medication Administration Whole meds with puree Compensations Minimize environmental distractions;Slow rate;Small sips/bites Postural Changes Seated upright at 90 degrees   CHL IP OTHER RECOMMENDATIONS 11/08/2020 Recommended Consults -- Oral Care Recommendations Oral care BID;Staff/trained caregiver to provide oral care Other Recommendations --   CHL IP FOLLOW UP RECOMMENDATIONS 11/08/2020 Follow up Recommendations 24 hour supervision/assistance;Skilled Nursing facility   [facility] IP FREQUENCY AND DURATION 11/08/2020 Speech Therapy Frequency (ACUTE ONLY) min 2x/week Treatment Duration 1 week      CHL IP ORAL PHASE 11/08/2020 Oral Phase -- Oral - Pudding Teaspoon -- Oral - Pudding Cup -- Oral - Honey Teaspoon -- Oral - Honey Cup -- Oral - Nectar Teaspoon -- Oral - Nectar Cup -- Oral - Nectar Straw -- Oral - Thin Teaspoon -- Oral - Thin Cup Delayed  oral transit;Weak lingual manipulation;Holding of bolus;Reduced posterior propulsion;Lingual/palatal residue Oral - Thin Straw Lingual/palatal residue;Delayed oral transit;Weak lingual manipulation;Reduced posterior propulsion Oral - Puree Delayed oral transit;Weak lingual manipulation;Lingual/palatal residue;Reduced posterior propulsion Oral - Mech Soft -- Oral - Regular -- Oral - Multi-Consistency -- Oral - Pill Decreased bolus cohesion;Delayed oral transit;Reduced posterior propulsion;Weak lingual manipulation;Premature spillage Oral Phase - Comment --  CHL IP PHARYNGEAL PHASE 11/08/2020 Pharyngeal Phase -- Pharyngeal- Pudding Teaspoon -- Pharyngeal -- Pharyngeal- Pudding Cup -- Pharyngeal -- Pharyngeal- Honey Teaspoon -- Pharyngeal -- Pharyngeal- Honey Cup -- Pharyngeal -- Pharyngeal- Nectar Teaspoon -- Pharyngeal -- Pharyngeal- Nectar Cup -- Pharyngeal -- Pharyngeal- Nectar Straw -- Pharyngeal -- Pharyngeal- Thin Teaspoon -- Pharyngeal -- Pharyngeal- Thin Cup Pharyngeal residue -  valleculae;Pharyngeal residue - pyriform;Pharyngeal residue - posterior pharnyx Pharyngeal Material enters airway, remains ABOVE vocal cords then ejected out Pharyngeal- Thin Straw Penetration/Aspiration during swallow;Pharyngeal residue - pyriform;Pharyngeal residue - posterior pharnyx;Pharyngeal residue - valleculae Pharyngeal Material enters airway, remains ABOVE vocal cords then ejected out Pharyngeal- Puree Pharyngeal residue - valleculae Pharyngeal -- Pharyngeal- Mechanical Soft -- Pharyngeal -- Pharyngeal- Regular -- Pharyngeal -- Pharyngeal- Multi-consistency -- Pharyngeal -- Pharyngeal- Pill WFL Pharyngeal -- Pharyngeal Comment --  CHL IP CERVICAL ESOPHAGEAL PHASE 11/08/2020 Cervical Esophageal Phase Impaired Pudding Teaspoon -- Pudding Cup -- Honey Teaspoon -- Honey Cup -- Nectar Teaspoon -- Nectar Cup -- Nectar Straw -- Thin Teaspoon -- Thin Cup Other (Comment) Thin Straw -- Puree -- Mechanical Soft -- Regular -- Multi-consistency -- Pill -- Cervical Esophageal Comment -- Sonia Baller, MA, CCC-SLP Speech Therapy             EEG adult  Result Date: 11/08/2020 Plancher, Baron Sane, MD     11/08/2020  4:46 PM PROCEDURE: EEG video routine 23 minutesstudy DATES OF TEST: 11/08/20 REASON FOR TEST: Seizure AED/Sedative MEDICATIONS: Keppra TECHNIQUE: This is an 18 channel digital EEG recording using the standard international 10/20 system of electrode placement with one channel EKG recording. Pt was awake and drowsy during this recording. FINDINGS: Background rhythm:symmetric 8.5-12 Hz  With intermittent asymmetric left 3-5 Hz Amplitude : normal Breach effect:  NO Variability: NO Reactivity: NO Rhythmic delta activity:NO Periodic discharges: NO Sporadic epileptiform discharges:NO Electrographic/electroclinical seizures:NO Limited EKG reveals no abnormalities. IMPRESSION: This is an abnormal study due to - 1. L Focal slowing is consistent with a focal disturbance of cerebral function and an underlying structural  lesion should be considered. 2. No definite epileptiform discharges or electrographic seizures noted.   Overnight EEG with video  Result Date: 11/09/2020 Lora Havens, MD     11/09/2020  4:52 AM Patient Name: Colton Bush MRN: [id number] Epilepsy Attending: Lora Havens Referring Physician/Provider: Dr Roland Rack Duration: 11/08/2020 2157 to 11/09/2020 0500 Patient history: 65 year old male with a history of pancreatic adenocarcinoma with recurrence, recent fall and admission for sepsis, now with seizures. EEG to evaluate for seizure Level of alertness: Awake, asleep AEDs during EEG study: LEV Technical aspects: This EEG study was done with scalp electrodes positioned according to the 10-20 International system of electrode placement. Electrical activity was acquired at a sampling rate of 500Hz  and reviewed with a high frequency filter of 70Hz  and a low frequency filter of 1Hz . EEG data were recorded continuously and digitally stored. Description: The posterior dominant rhythm consists of 9 Hz activity of moderate voltage (25-35 uV) seen predominantly in posterior head regions, symmetric and reactive to eye opening and eye closing.  Sleep was characterized by vertex waves, sleep spindles (  12 to 14 Hz), maximal frontocentral region.  EEG showed intermittent 3 to 6 Hz theta-delta slowing in left temporal region. Hyperventilation and photic stimulation were not performed.   ABNORMALITY - Intermittent slow, left temporal region IMPRESSION: This study is suggestive of cortical dysfunction arising from left temporal region, nonspecific etiology but likely secondary to underlying subdural Hematoma. No seizures or definite epileptiform discharges were seen throughout the recording. Priyanka Barbra Sarks      Assessment/Plan:  INTERVAL HISTORY:   Patient has now been found to have area concerning for subdural empyema   Principal Problem:   Hepatic abscess Active Problems:   Essential  hypertension   GERD without esophagitis   Mixed diabetic hyperlipidemia associated with type 2 diabetes mellitus ([place])   Adenocarcinoma of head of pancreas ([place])   Sepsis ([place])   Uncontrolled type 2 diabetes mellitus with ketoacidosis without coma, with long-term current use of insulin (HCC)   Increased anion gap metabolic acidosis   Chronic diastolic CHF (congestive heart failure) (HCC)   BPH with urinary obstruction   Pressure ulcer of sacral region, stage 2 (H[facility])   Pneumonia of both lower lobes due to infectious organism   Sepsis secondary to UTI ([place])    RANDON SOMERA is a 65 y.o. male with  Hx of pancreatic cancer recent fall with Strep anginoisis in 1/2 blood cultures sp surgery to hip fracture, readmitted with sepsis and found to have liver abscesses sp IR guided drain.  Now felt been found on imaging have an area concerning for subdural empyema.  He had been taken to the operating room since I saw him this morning undergone bur hole craniotomy with evacuation with what appears to be a hematoma instead.  I change his antibiotics to ceftriaxone and metronidazole to achieve better CNS penetration.  He was also growing Klebsiella species had an E. Coli with susceptibilities still pending  Soft concerns the patient could have endocarditis  Repeat 2D echocardiogram did not show any vegetations.  Would consider transesophageal echocardiogram when safe to do so.  It appears that the area evacuated in the OR today was not sent for culture as it looked like a hematoma.         LOS: 3 days   Alcide Evener 11/09/2020, 2:38 PM

## 2020-11-09 NOTE — Procedures (Addendum)
Patient Name: Colton Bush  MRN: [id number]  Epilepsy Attending: Lora Havens  Referring Physician/Provider: Dr Roland Rack Duration: 11/08/2020 2157 to 11/09/2020 1008  Patient history: 65 year old male with a history of pancreatic adenocarcinoma with recurrence, recent fall and admission for sepsis, now with seizures. EEG to evaluate for seizure  Level of alertness: Awake, asleep  AEDs during EEG study: LEV  Technical aspects: This EEG study was done with scalp electrodes positioned according to the 10-20 International system of electrode placement. Electrical activity was acquired at a sampling rate of 500Hz  and reviewed with a high frequency filter of 70Hz  and a low frequency filter of 1Hz . EEG data were recorded continuously and digitally stored.   Description: The posterior dominant rhythm consists of 9 Hz activity of moderate voltage (25-35 uV) seen predominantly in posterior head regions, symmetric and reactive to eye opening and eye closing.  Sleep was characterized by vertex waves, sleep spindles (12 to 14 Hz), maximal frontocentral region.  EEG showed intermittent 3 to 6 Hz theta-delta slowing in left temporal region. Hyperventilation and photic stimulation were not performed.     ABNORMALITY - Intermittent slow, left temporal region  IMPRESSION: This study is suggestive of cortical dysfunction arising from left temporal region, nonspecific etiology but likely secondary to underlying subdural Hematoma. No seizures or definite epileptiform discharges were seen throughout the recording.  Carolyn Maniscalco Barbra Sarks

## 2020-11-09 NOTE — Brief Op Note (Signed)
11/09/2020  1:40 PM  PATIENT:  Colton Bush  65 y.o. male  PRE-OPERATIVE DIAGNOSIS:  Subdural empyema  POST-OPERATIVE DIAGNOSIS:  Subdural hematoma  PROCEDURE:  Procedure(s): LEFT BURR HOLES FOR EVACUATION OF Subdural Hematoma (Left)  SURGEON:  Surgeon(s) and Role:    * Judith Part, MD - Primary  PHYSICIAN ASSISTANT:   ASSISTANTS: none   ANESTHESIA:   general  EBL:  10cc   BLOOD ADMINISTERED:none  DRAINS: none   LOCAL MEDICATIONS USED:  LIDOCAINE   SPECIMEN:  No Specimen  DISPOSITION OF SPECIMEN:  N/A  COUNTS:  YES  TOURNIQUET:  * No tourniquets in log *  DICTATION: .Note written in EPIC  PLAN OF CARE: Admit to inpatient   PATIENT DISPOSITION:  PACU - hemodynamically stable.   Delay start of Pharmacological VTE agent (>24hrs) due to surgical blood loss or risk of bleeding: yes

## 2020-11-09 NOTE — Progress Notes (Signed)
Patient off unit to OR.

## 2020-11-09 NOTE — Anesthesia Preprocedure Evaluation (Signed)
Anesthesia Evaluation  Patient identified by MRN, date of birth, ID band Patient awake    Reviewed: Allergy & Precautions, NPO status , Patient's Chart, lab work & pertinent test results  Airway Mallampati: I  TM Distance: >3 FB Neck ROM: Full    Dental  (+) Edentulous Upper   Pulmonary     + decreased breath sounds      Cardiovascular hypertension, Pt. on medications +CHF   Rhythm:Regular Rate:Normal     Neuro/Psych PSYCHIATRIC DISORDERS Depression    GI/Hepatic Neg liver ROS, hiatal hernia, GERD  Medicated,  Endo/Other  diabetes, Type 2, Insulin Dependent  Renal/GU negative Renal ROS     Musculoskeletal  (+) Arthritis ,   Abdominal Normal abdominal exam  (+)   Peds  Hematology negative hematology ROS (+)   Anesthesia Other Findings   Reproductive/Obstetrics                             Anesthesia Physical Anesthesia Plan  ASA: III and emergent  Anesthesia Plan: General   Post-op Pain Management:    Induction: Intravenous  PONV Risk Score and Plan: 3 and Ondansetron, Dexamethasone and Treatment may vary due to age or medical condition  Airway Management Planned: Oral ETT  Additional Equipment: None  Intra-op Plan:   Post-operative Plan: Extubation in OR  Informed Consent: I have reviewed the patients History and Physical, chart, labs and discussed the procedure including the risks, benefits and alternatives for the proposed anesthesia with the patient or authorized representative who has indicated his/her understanding and acceptance.     Consent reviewed with POA and Dental advisory given  Plan Discussed with: CRNA  Anesthesia Plan Comments: (Discussed with patient. Attempted to call spouse x2 prior to proceeding to OR. )        Anesthesia Quick Evaluation

## 2020-11-10 ENCOUNTER — Ambulatory Visit: Payer: Medicare Other | Admitting: Radiation Oncology

## 2020-11-10 ENCOUNTER — Encounter (HOSPITAL_COMMUNITY): Payer: Self-pay | Admitting: Neurological Surgery

## 2020-11-10 DIAGNOSIS — C250 Malignant neoplasm of head of pancreas: Secondary | ICD-10-CM | POA: Diagnosis not present

## 2020-11-10 DIAGNOSIS — J189 Pneumonia, unspecified organism: Secondary | ICD-10-CM

## 2020-11-10 DIAGNOSIS — J869 Pyothorax without fistula: Secondary | ICD-10-CM

## 2020-11-10 DIAGNOSIS — L0291 Cutaneous abscess, unspecified: Secondary | ICD-10-CM

## 2020-11-10 DIAGNOSIS — E872 Acidosis: Secondary | ICD-10-CM

## 2020-11-10 DIAGNOSIS — K750 Abscess of liver: Secondary | ICD-10-CM | POA: Diagnosis not present

## 2020-11-10 DIAGNOSIS — S065X9A Traumatic subdural hemorrhage with loss of consciousness of unspecified duration, initial encounter: Secondary | ICD-10-CM

## 2020-11-10 DIAGNOSIS — N401 Enlarged prostate with lower urinary tract symptoms: Secondary | ICD-10-CM | POA: Diagnosis not present

## 2020-11-10 LAB — CBC
HCT: 30.6 % — ABNORMAL LOW (ref 39.0–52.0)
HCT: 33.5 % — ABNORMAL LOW (ref 39.0–52.0)
Hemoglobin: 10 g/dL — ABNORMAL LOW (ref 13.0–17.0)
Hemoglobin: 10.9 g/dL — ABNORMAL LOW (ref 13.0–17.0)
MCH: 30 pg (ref 26.0–34.0)
MCH: 29.5 pg (ref 26.0–34.0)
MCHC: 32.7 g/dL (ref 30.0–36.0)
MCHC: 32.5 g/dL (ref 30.0–36.0)
MCV: 91.9 fL (ref 80.0–100.0)
MCV: 90.8 fL (ref 80.0–100.0)
Platelets: 415 10*3/uL — ABNORMAL HIGH (ref 150–400)
Platelets: 469 10*3/uL — ABNORMAL HIGH (ref 150–400)
RBC: 3.33 MIL/uL — ABNORMAL LOW (ref 4.22–5.81)
RBC: 3.69 MIL/uL — ABNORMAL LOW (ref 4.22–5.81)
RDW: 14.4 % (ref 11.5–15.5)
RDW: 14.4 % (ref 11.5–15.5)
WBC: 9.6 10*3/uL (ref 4.0–10.5)
WBC: 11 10*3/uL — ABNORMAL HIGH (ref 4.0–10.5)
nRBC: 0 % (ref 0.0–0.2)
nRBC: 0 % (ref 0.0–0.2)

## 2020-11-10 LAB — BASIC METABOLIC PANEL
Anion gap: 4 — ABNORMAL LOW (ref 5–15)
Anion gap: 8 (ref 5–15)
BUN: 6 mg/dL — ABNORMAL LOW (ref 8–23)
BUN: 7 mg/dL — ABNORMAL LOW (ref 8–23)
CO2: 30 mmol/L (ref 22–32)
CO2: 26 mmol/L (ref 22–32)
Calcium: 8 mg/dL — ABNORMAL LOW (ref 8.9–10.3)
Calcium: 8 mg/dL — ABNORMAL LOW (ref 8.9–10.3)
Chloride: 102 mmol/L (ref 98–111)
Chloride: 101 mmol/L (ref 98–111)
Creatinine, Ser: 0.42 mg/dL — ABNORMAL LOW (ref 0.61–1.24)
Creatinine, Ser: 0.44 mg/dL — ABNORMAL LOW (ref 0.61–1.24)
GFR, Estimated: >60 mL/min (ref >60)
GFR, Estimated: >60 mL/min (ref >60)
Glucose, Bld: 209 mg/dL — ABNORMAL HIGH (ref 70–99)
Glucose, Bld: 210 mg/dL — ABNORMAL HIGH (ref 70–99)
Potassium: 3.3 mmol/L — ABNORMAL LOW (ref 3.5–5.1)
Potassium: 3.6 mmol/L (ref 3.5–5.1)
Sodium: 136 mmol/L (ref 135–145)
Sodium: 135 mmol/L (ref 135–145)

## 2020-11-10 LAB — GLUCOSE, CAPILLARY
Glucose-Capillary: 255 mg/dL — ABNORMAL HIGH (ref 70–99)
Glucose-Capillary: 199 mg/dL — ABNORMAL HIGH (ref 70–99)
Glucose-Capillary: 311 mg/dL — ABNORMAL HIGH (ref 70–99)
Glucose-Capillary: 312 mg/dL — ABNORMAL HIGH (ref 70–99)
Glucose-Capillary: 257 mg/dL — ABNORMAL HIGH (ref 70–99)
Glucose-Capillary: 271 mg/dL — ABNORMAL HIGH (ref 70–99)

## 2020-11-10 MED ORDER — INSULIN ASPART 100 UNIT/ML ~~LOC~~ SOLN
0.0000 [IU] | Freq: Every day | SUBCUTANEOUS | Status: DC
  Administered 2020-11-10: 3 [IU] via SUBCUTANEOUS

## 2020-11-10 MED ORDER — INSULIN DETEMIR 100 UNIT/ML ~~LOC~~ SOLN
10.0000 [IU] | Freq: Two times a day (BID) | SUBCUTANEOUS | Status: DC
  Administered 2020-11-10 – 2020-11-12 (×4): 10 [IU] via SUBCUTANEOUS
  Filled 2020-11-10 (×5): qty 0.1

## 2020-11-10 MED ORDER — INSULIN ASPART 100 UNIT/ML ~~LOC~~ SOLN
0.0000 [IU] | Freq: Three times a day (TID) | SUBCUTANEOUS | Status: DC
  Administered 2020-11-11 (×2): 5 [IU] via SUBCUTANEOUS
  Administered 2020-11-12: 2 [IU] via SUBCUTANEOUS
  Administered 2020-11-12: 5 [IU] via SUBCUTANEOUS
  Administered 2020-11-12: 8 [IU] via SUBCUTANEOUS

## 2020-11-10 NOTE — Progress Notes (Signed)
Inpatient Diabetes Program Recommendations  AACE/ADA: New Consensus Statement on Inpatient Glycemic Control   Target Ranges:  Prepandial:   less than 140 mg/dL      Peak postprandial:   less than 180 mg/dL (1-2 hours)      Critically ill patients:  140 - 180 mg/dL   Results for Colton Bush, Colton "[name]" (MRN [id number]) as of 11/10/2020 11:20  Ref. Range 11/09/2020 04:22 11/09/2020 07:47 11/09/2020 08:18 11/09/2020 11:05 11/09/2020 13:45 11/09/2020 15:22 11/09/2020 19:16 11/09/2020 23:20 11/10/2020 03:24 11/10/2020 07:18 11/10/2020 09:45 11/10/2020 11:10  Glucose-Capillary Latest Ref Range: 70 - 99 mg/dL 106 (H) 64 (L) 110 (H) 95 103 (H)     Decadron 10 mg 127 (H) 207 (H)  Novolog 3 units 327 (H)  Novolog 7 units 255 (H)  Novolog 5 units 199 (H)  Novolog 2 units     Levemir 10 units 311 (H)  Results for [name], [name] "[name]" (MRN [id number]) as of 11/10/2020 11:20  Ref. Range 11/08/2020 03:57 11/08/2020 07:54 11/08/2020 11:01 11/08/2020 12:49 11/08/2020 16:46 11/08/2020 20:50 11/08/2020 22:15 11/08/2020 23:27  Glucose-Capillary Latest Ref Range: 70 - 99 mg/dL 92 86     Levemir 20 units 125 (H)  Novolog 1 unit 186 (H)  Novolog 2 units 228 (H)   Novolog 3 units 202 (H)  Novolog 3 units @00 :35   Review of Glycemic Control  Current orders for Inpatient glycemic control: Levemir 10 units daily, Novolog 0-9 units Q4H  Inpatient Diabetes Program Recommendations:    Insulin: Noted Levemir decreased from 20  Units BID to 10 units daily on 11/09/20. Please consider increasing Levemir to 15 units BID.  Thanks, Barnie Alderman, RN, MSN, CDE Diabetes Coordinator Inpatient Diabetes Program [phone] (Team Pager from 8am to 5pm)

## 2020-11-10 NOTE — Progress Notes (Signed)
Subjective:  No new complaints Antibiotics:  Anti-infectives (From admission, onward)   Start     Dose/Rate Route Frequency Ordered Stop   11/09/20 1000  cefTRIAXone (ROCEPHIN) 2 g in sodium chloride 0.9 % 100 mL IVPB        2 g 200 mL/hr over 30 Minutes Intravenous Every 12 hours 11/09/20 0828     11/09/20 0915  metroNIDAZOLE (FLAGYL) tablet 500 mg        500 mg Oral Every 8 hours 11/09/20 0828     11/07/20 2200  Ampicillin-Sulbactam (UNASYN) 3 g in sodium chloride 0.9 % 100 mL IVPB  Status:  Discontinued        3 g 200 mL/hr over 30 Minutes Intravenous Every 6 hours 11/06/20 2135 11/07/20 0943   11/07/20 1800  Ampicillin-Sulbactam (UNASYN) 3 g in sodium chloride 0.9 % 100 mL IVPB  Status:  Discontinued        3 g 200 mL/hr over 30 Minutes Intravenous Every 6 hours 11/07/20 1219 11/09/20 0828   11/07/20 1600  Ampicillin-Sulbactam (UNASYN) 3 g in sodium chloride 0.9 % 100 mL IVPB  Status:  Discontinued        3 g 200 mL/hr over 30 Minutes Intravenous Every 6 hours 11/07/20 0943 11/07/20 1219   11/07/20 1030  Ampicillin-Sulbactam (UNASYN) 3 g in sodium chloride 0.9 % 100 mL IVPB        3 g 200 mL/hr over 30 Minutes Intravenous  Once 11/07/20 0943 11/07/20 1213   11/06/20 1800  Ampicillin-Sulbactam (UNASYN) 3 g in sodium chloride 0.9 % 100 mL IVPB  Status:  Discontinued        3 g 200 mL/hr over 30 Minutes Intravenous Every 6 hours 11/06/20 1442 11/06/20 2135   11/06/20 1100  cefTRIAXone (ROCEPHIN) 2 g in sodium chloride 0.9 % 100 mL IVPB  Status:  Discontinued        2 g 200 mL/hr over 30 Minutes Intravenous Every 24 hours 11/06/20 0855 11/06/20 1435   11/06/20 0800  vancomycin (VANCOREADY) IVPB 1250 mg/250 mL  Status:  Discontinued        1,250 mg 166.7 mL/hr over 90 Minutes Intravenous Every 12 hours 11/06/20 0643 11/06/20 0854   11/06/20 0700  metroNIDAZOLE (FLAGYL) IVPB 500 mg  Status:  Discontinued        500 mg 100 mL/hr over 60 Minutes Intravenous Every 8 hours  11/06/20 0638 11/06/20 1452   11/06/20 0400  ceFEPIme (MAXIPIME) 2 g in sodium chloride 0.9 % 100 mL IVPB  Status:  Discontinued        2 g 200 mL/hr over 30 Minutes Intravenous Every 8 hours 11/05/20 2358 11/06/20 0853   11/05/20 1845  vancomycin (VANCOREADY) IVPB 1500 mg/300 mL        1,500 mg 150 mL/hr over 120 Minutes Intravenous  Once 11/05/20 1830 11/05/20 2237   11/05/20 1830  ceFEPIme (MAXIPIME) 2 g in sodium chloride 0.9 % 100 mL IVPB        2 g 200 mL/hr over 30 Minutes Intravenous  Once 11/05/20 1824 11/05/20 2103   11/05/20 1830  metroNIDAZOLE (FLAGYL) IVPB 500 mg        500 mg 100 mL/hr over 60 Minutes Intravenous  Once 11/05/20 1824 11/05/20 2140   11/05/20 1830  vancomycin (VANCOCIN) IVPB 1000 mg/200 mL premix  Status:  Discontinued        1,000 mg 200 mL/hr over 60 Minutes Intravenous  Once 11/05/20  1824 11/05/20 1830      Medications: Scheduled Meds: . Chlorhexidine Gluconate Cloth  6 each Topical Daily  . insulin aspart  0-9 Units Subcutaneous Q4H  . insulin detemir  10 Units Subcutaneous Daily  . ipratropium  0.5 mg Nebulization BID  . levalbuterol  0.63 mg Nebulization BID  . lipase/protease/amylase  12,000 Units Oral TID with meals  . loratadine  10 mg Oral Daily  . mouth rinse  15 mL Mouth Rinse BID  . metroNIDAZOLE  500 mg Oral Q8H  . pantoprazole (PROTONIX) IV  40 mg Intravenous Q24H  . sodium chloride flush  5 mL Intracatheter Q8H  . tamsulosin  0.4 mg Oral Daily   Continuous Infusions: . sodium chloride 10 mL/hr at 11/09/20 1201  . cefTRIAXone (ROCEPHIN)  IV Stopped (11/10/20 1014)  . dextrose 5% lactated ringers 75 mL/hr at 11/10/20 1400  . levETIRAcetam Stopped (11/10/20 0803)   PRN Meds:.acetaminophen **OR** acetaminophen, dextrose, hydrALAZINE, LORazepam, ondansetron **OR** ondansetron (ZOFRAN) IV, polyethylene glycol    Objective: Weight change:   Intake/Output Summary (Last 24 hours) at 11/10/2020 1512 Last data filed at 11/10/2020  1400 Gross per 24 hour  Intake 1328.97 ml  Output 2695 ml  Net -1366.03 ml   Blood pressure (!) 106/57, pulse 87, temperature 97.7 F (36.5 C), temperature source Oral, resp. rate 19, height 5\' 10"  (1.778 m), weight 86.3 kg, SpO2 98 %. Temp:  [97.7 F (36.5 C)-98.5 F (36.9 C)] 97.7 F (36.5 C) (04/13 1200) Pulse Rate:  [56-103] 87 (04/13 1400) Resp:  [10-24] 19 (04/13 1400) BP: (96-142)/(57-76) 106/57 (04/13 1400) SpO2:  [91 %-100 %] 98 % (04/13 1400) Arterial Line BP: (131-186)/(55-71) 131/60 (04/13 1000)  Physical Exam: Physical Exam Constitutional:      Appearance: He is well-developed.  HENT:     Head: Normocephalic and atraumatic.  Eyes:     Conjunctiva/sclera: Conjunctivae normal.  Cardiovascular:     Rate and Rhythm: Normal rate and regular rhythm.     Heart sounds: No murmur heard. No friction rub. No gallop.   Pulmonary:     Effort: Pulmonary effort is normal. No respiratory distress.     Breath sounds: No stridor. No wheezing or rhonchi.  Abdominal:     General: There is no distension.     Palpations: Abdomen is soft.     Tenderness: There is no abdominal tenderness. There is no guarding.  Musculoskeletal:        General: Normal range of motion.     Cervical back: Normal range of motion and neck supple.  Skin:    General: Skin is warm and dry.     Findings: No erythema or rash.  Neurological:     General: No focal deficit present.     Mental Status: He is alert and oriented to person, place, and time.  Psychiatric:        Mood and Affect: Mood normal.        Speech: Speech is delayed.        Behavior: Behavior normal.        Thought Content: Thought content normal.     JP drains x 2    Burr hole site is sutured  CBC:    BMET Recent Labs    11/10/20 0705 11/10/20 0829  NA 136 135  K 3.3* 3.6  CL 102 101  CO2 30 26  GLUCOSE 209* 210*  BUN 6* 7*  CREATININE 0.42* 0.44*  CALCIUM 8.0* 8.0*  Liver Panel  Recent Labs     11/08/20 0237 11/09/20 0132  PROT 6.2* 5.6*  ALBUMIN 2.1* 1.7*  AST 50* 27  ALT 40 31  ALKPHOS 258* 234*  BILITOT 0.7 0.6       Sedimentation Rate No results for input(s): ESRSEDRATE in the last 72 hours. C-Reactive Protein No results for input(s): CRP in the last 72 hours.  Micro Results: Recent Results (from the past 720 hour(s))  Resp Panel by RT-PCR (Flu A&B, Covid) Nasopharyngeal Swab     Status: None   Collection Time: 10/18/20  7:11 PM   Specimen: Nasopharyngeal Swab; Nasopharyngeal(NP) swabs in vial transport medium  Result Value Ref Range Status   SARS Coronavirus 2 by RT PCR NEGATIVE NEGATIVE Final    Comment: (NOTE) SARS-CoV-2 target nucleic acids are NOT DETECTED.  The SARS-CoV-2 RNA is generally detectable in upper respiratory specimens during the acute phase of infection. The lowest concentration of SARS-CoV-2 viral copies this assay can detect is 138 copies/mL. A negative result does not preclude SARS-Cov-2 infection and should not be used as the sole basis for treatment or other patient management decisions. A negative result may occur with  improper specimen collection/handling, submission of specimen other than nasopharyngeal swab, presence of viral mutation(s) within the areas targeted by this assay, and inadequate number of viral copies(<138 copies/mL). A negative result must be combined with clinical observations, patient history, and epidemiological information. The expected result is Negative.  Fact Sheet for Patients:  EntrepreneurPulse.com.au  Fact Sheet for Healthcare Providers:  IncredibleEmployment.be  This test is no t yet approved or cleared by the [place] FDA and  has been authorized for detection and/or diagnosis of SARS-CoV-2 by FDA under an Emergency Use Authorization (EUA). This EUA will remain  in effect (meaning this test can be used) for the duration of the COVID-19 declaration under  Section 564(b)(1) of the Act, 21 U.S.C.section 360bbb-3(b)(1), unless the authorization is terminated  or revoked sooner.       Influenza A by PCR NEGATIVE NEGATIVE Final   Influenza B by PCR NEGATIVE NEGATIVE Final    Comment: (NOTE) The Xpert Xpress SARS-CoV-2/FLU/RSV plus assay is intended as an aid in the diagnosis of influenza from Nasopharyngeal swab specimens and should not be used as a sole basis for treatment. Nasal washings and aspirates are unacceptable for Xpert Xpress SARS-CoV-2/FLU/RSV testing.  Fact Sheet for Patients: EntrepreneurPulse.com.au  Fact Sheet for Healthcare Providers: IncredibleEmployment.be  This test is not yet approved or cleared by the [place] FDA and has been authorized for detection and/or diagnosis of SARS-CoV-2 by FDA under an Emergency Use Authorization (EUA). This EUA will remain in effect (meaning this test can be used) for the duration of the COVID-19 declaration under Section 564(b)(1) of the Act, 21 U.S.C. section 360bbb-3(b)(1), unless the authorization is terminated or revoked.  Performed at [facility], [place] [address] [place], [place] [address]   Surgical pcr screen     Status: Abnormal   Collection Time: 10/19/20 12:47 AM   Specimen: Nasal Mucosa; Nasal Swab  Result Value Ref Range Status   MRSA, PCR NEGATIVE NEGATIVE Final   Staphylococcus aureus POSITIVE (A) NEGATIVE Final    Comment: RESULT CALLED TO, READ BACK BY AND VERIFIED WITH: BOBBY, RN @ [phone] ON 10/19/20 C VARNER (NOTE) The Xpert SA Assay (FDA approved for NASAL specimens in patients 11 years of age and older), is one component of a comprehensive surveillance program. It is not intended to  diagnose infection nor to guide or monitor treatment. Performed at [facility], [place] [address], [place], [place] [address]   Culture, blood (routine x 2)     Status: None   Collection Time:  10/20/20  7:55 AM   Specimen: BLOOD  Result Value Ref Range Status   Specimen Description   Final    BLOOD RIGHT ANTECUBITAL Performed at [facility] [address] [place], [place] [address]    Special Requests   Final    BOTTLES DRAWN AEROBIC AND ANAEROBIC Blood Culture results may not be optimal due to an inadequate volume of blood received in culture bottles Performed at [facility] [address], [place], [place] [address]    Culture   Final    NO GROWTH 5 DAYS Performed at [facility], [place] [address], [place], [place] [address]    Report Status 10/25/2020 FINAL  Final  Culture, blood (routine x 2)     Status: Abnormal   Collection Time: 10/20/20  7:56 AM   Specimen: BLOOD  Result Value Ref Range Status   Specimen Description   Final    BLOOD RIGHT ANTECUBITAL Performed at [facility] [address], [place], [place] [address]    Special Requests   Final    BOTTLES DRAWN AEROBIC AND ANAEROBIC Blood Culture results may not be optimal due to an inadequate volume of blood received in culture bottles Performed at [facility] [address] [place], [place] [address]    Culture  Setup Time   Final    AEROBIC BOTTLE ONLY GRAM POSITIVE COCCI CRITICAL RESULT CALLED TO, READ BACK BY AND VERIFIED WITHMelodye Ped [facility] 2585 10/21/20 A BROWNING Performed at [facility], [place] [address], [place], [place] [address]    Culture STREPTOCOCCUS ANGINOSIS (A)  Final   Report Status 10/24/2020 FINAL  Final   Organism ID, Bacteria STREPTOCOCCUS ANGINOSIS  Final      Susceptibility   Streptococcus anginosis - MIC*    PENICILLIN <=0.06 SENSITIVE Sensitive     CEFTRIAXONE 0.5 SENSITIVE Sensitive     ERYTHROMYCIN <=0.12 SENSITIVE Sensitive     LEVOFLOXACIN 0.5 SENSITIVE Sensitive     VANCOMYCIN 1 SENSITIVE Sensitive     * STREPTOCOCCUS ANGINOSIS  Blood Culture ID Panel (Reflexed)     Status:  Abnormal   Collection Time: 10/20/20  7:56 AM  Result Value Ref Range Status   Enterococcus faecalis NOT DETECTED NOT DETECTED Final   Enterococcus Faecium NOT DETECTED NOT DETECTED Final   Listeria monocytogenes NOT DETECTED NOT DETECTED Final   Staphylococcus species NOT DETECTED NOT DETECTED Final   Staphylococcus aureus (BCID) NOT DETECTED NOT DETECTED Final   Staphylococcus epidermidis NOT DETECTED NOT DETECTED Final   Staphylococcus lugdunensis NOT DETECTED NOT DETECTED Final   Streptococcus species DETECTED (A) NOT DETECTED Final    Comment: Not Enterococcus species, Streptococcus agalactiae, Streptococcus pyogenes, or Streptococcus pneumoniae. CRITICAL RESULT CALLED TO, READ BACK BY AND VERIFIED WITH: Melodye Ped PHARMD 1316 10/21/20 A BROWNING    Streptococcus agalactiae NOT DETECTED NOT DETECTED Final   Streptococcus pneumoniae NOT DETECTED NOT DETECTED Final   Streptococcus pyogenes NOT DETECTED NOT DETECTED Final   A.calcoaceticus-baumannii NOT DETECTED NOT DETECTED Final   Bacteroides fragilis NOT DETECTED NOT DETECTED Final   Enterobacterales NOT DETECTED NOT DETECTED Final   Enterobacter cloacae complex NOT DETECTED NOT DETECTED Final   Escherichia coli NOT DETECTED NOT DETECTED  Final   Klebsiella aerogenes NOT DETECTED NOT DETECTED Final   Klebsiella oxytoca NOT DETECTED NOT DETECTED Final   Klebsiella pneumoniae NOT DETECTED NOT DETECTED Final   Proteus species NOT DETECTED NOT DETECTED Final   Salmonella species NOT DETECTED NOT DETECTED Final   Serratia marcescens NOT DETECTED NOT DETECTED Final   Haemophilus influenzae NOT DETECTED NOT DETECTED Final   Neisseria meningitidis NOT DETECTED NOT DETECTED Final   Pseudomonas aeruginosa NOT DETECTED NOT DETECTED Final   Stenotrophomonas maltophilia NOT DETECTED NOT DETECTED Final   Candida albicans NOT DETECTED NOT DETECTED Final   Candida auris NOT DETECTED NOT DETECTED Final   Candida glabrata NOT DETECTED NOT DETECTED  Final   Candida krusei NOT DETECTED NOT DETECTED Final   Candida parapsilosis NOT DETECTED NOT DETECTED Final   Candida tropicalis NOT DETECTED NOT DETECTED Final   Cryptococcus neoformans/gattii NOT DETECTED NOT DETECTED Final    Comment: Performed at [facility], [place] [address] [place], [place] [address]  Culture, Urine     Status: Abnormal   Collection Time: 10/20/20 10:25 AM   Specimen: Urine, Clean Catch  Result Value Ref Range Status   Specimen Description   Final    URINE, CLEAN CATCH Performed at [facility], [place] [address] [place], [place] [address]    Special Requests   Final    NONE Performed at [facility], [place] [address], [place], [place] [address]    Culture >=100,000 COLONIES/mL ESCHERICHIA COLI (A)  Final   Report Status 10/23/2020 FINAL  Final   Organism ID, Bacteria ESCHERICHIA COLI (A)  Final      Susceptibility   Escherichia coli - MIC*    AMPICILLIN 4 SENSITIVE Sensitive     CEFAZOLIN <=4 SENSITIVE Sensitive     CEFEPIME <=0.12 SENSITIVE Sensitive     CEFTRIAXONE <=0.25 SENSITIVE Sensitive     CIPROFLOXACIN <=0.25 SENSITIVE Sensitive     GENTAMICIN <=1 SENSITIVE Sensitive     IMIPENEM <=0.25 SENSITIVE Sensitive     NITROFURANTOIN <=16 SENSITIVE Sensitive     TRIMETH/SULFA <=20 SENSITIVE Sensitive     AMPICILLIN/SULBACTAM <=2 SENSITIVE Sensitive     PIP/TAZO <=4 SENSITIVE Sensitive     * >=100,000 COLONIES/mL ESCHERICHIA COLI  Culture, blood (routine x 2)     Status: None   Collection Time: 10/22/20  7:26 AM   Specimen: BLOOD  Result Value Ref Range Status   Specimen Description   Final    BLOOD LEFT ANTECUBITAL Performed at [facility] [address], [place], [place] [address]    Special Requests   Final    BOTTLES DRAWN AEROBIC ONLY Blood Culture results may not be optimal due to an inadequate volume of blood received in culture bottles Performed at [place] [address] [place], [place] [address]    Culture   Final    NO GROWTH 5 DAYS Performed at [facility], [place] [address] [place], [place] [address]    Report Status 10/27/2020 FINAL  Final  Culture, blood (routine x 2)     Status: None   Collection Time: 10/22/20  7:26 AM   Specimen: BLOOD  Result Value Ref Range Status   Specimen Description   Final    BLOOD LEFT ANTECUBITAL Performed at [place] [address] [place], [place] [address]    Special Requests   Final    BOTTLES DRAWN AEROBIC ONLY Blood Culture  results may not be optimal due to an inadequate volume of blood received in culture bottles Performed at [facility], [place] [address], [place], [place] [address]    Culture   Final    NO GROWTH 5 DAYS Performed at [facility], [place] [address], [place], [place] [address]    Report Status 10/27/2020 FINAL  Final  SARS CORONAVIRUS 2 (TAT 6-24 HRS) Nasopharyngeal Nasopharyngeal Swab     Status: None   Collection Time: 10/27/20  1:19 PM   Specimen: Nasopharyngeal Swab  Result Value Ref Range Status   SARS Coronavirus 2 NEGATIVE NEGATIVE Final    Comment: (NOTE) SARS-CoV-2 target nucleic acids are NOT DETECTED.  The SARS-CoV-2 RNA is generally detectable in upper and lower respiratory specimens during the acute phase of infection. Negative results do not preclude SARS-CoV-2 infection, do not rule out co-infections with other pathogens, and should not be used as the sole basis for treatment or other patient management decisions. Negative results must be combined with clinical observations, patient history, and epidemiological information. The expected result is Negative.  Fact Sheet for Patients: SugarRoll.be  Fact Sheet for Healthcare Providers: https://www.woods-mathews.com/  This test is not yet approved or cleared by the [place] FDA and  has been authorized for  detection and/or diagnosis of SARS-CoV-2 by FDA under an Emergency Use Authorization (EUA). This EUA will remain  in effect (meaning this test can be used) for the duration of the COVID-19 declaration under Se ction 564(b)(1) of the Act, 21 U.S.C. section 360bbb-3(b)(1), unless the authorization is terminated or revoked sooner.  Performed at [facility], [place] [address] [place], [place] [address]   Resp Panel by RT-PCR (Flu A&B, Covid) Nasopharyngeal Swab     Status: None   Collection Time: 11/05/20  7:14 PM   Specimen: Nasopharyngeal Swab; Nasopharyngeal(NP) swabs in vial transport medium  Result Value Ref Range Status   SARS Coronavirus 2 by RT PCR NEGATIVE NEGATIVE Final    Comment: (NOTE) SARS-CoV-2 target nucleic acids are NOT DETECTED.  The SARS-CoV-2 RNA is generally detectable in upper respiratory specimens during the acute phase of infection. The lowest concentration of SARS-CoV-2 viral copies this assay can detect is 138 copies/mL. A negative result does not preclude SARS-Cov-2 infection and should not be used as the sole basis for treatment or other patient management decisions. A negative result may occur with  improper specimen collection/handling, submission of specimen other than nasopharyngeal swab, presence of viral mutation(s) within the areas targeted by this assay, and inadequate number of viral copies(<138 copies/mL). A negative result must be combined with clinical observations, patient history, and epidemiological information. The expected result is Negative.  Fact Sheet for Patients:  EntrepreneurPulse.com.au  Fact Sheet for Healthcare Providers:  IncredibleEmployment.be  This test is no t yet approved or cleared by the [place] FDA and  has been authorized for detection and/or diagnosis of SARS-CoV-2 by FDA under an Emergency Use Authorization (EUA). This EUA will remain  in effect (meaning this test can  be used) for the duration of the COVID-19 declaration under Section 564(b)(1) of the Act, 21 U.S.C.section 360bbb-3(b)(1), unless the authorization is terminated  or revoked sooner.       Influenza A by PCR NEGATIVE NEGATIVE Final   Influenza B by PCR NEGATIVE NEGATIVE Final    Comment: (NOTE) The Xpert Xpress SARS-CoV-2/FLU/RSV plus assay is intended as an aid in the diagnosis of influenza from Nasopharyngeal swab specimens and should not  be used as a sole basis for treatment. Nasal washings and aspirates are unacceptable for Xpert Xpress SARS-CoV-2/FLU/RSV testing.  Fact Sheet for Patients: EntrepreneurPulse.com.au  Fact Sheet for Healthcare Providers: IncredibleEmployment.be  This test is not yet approved or cleared by the [place] FDA and has been authorized for detection and/or diagnosis of SARS-CoV-2 by FDA under an Emergency Use Authorization (EUA). This EUA will remain in effect (meaning this test can be used) for the duration of the COVID-19 declaration under Section 564(b)(1) of the Act, 21 U.S.C. section 360bbb-3(b)(1), unless the authorization is terminated or revoked.  Performed at [facility], [place] [address] [place] [address], [place], [place] [address]   Blood Culture (routine x 2)     Status: None (Preliminary result)   Collection Time: 11/05/20  8:36 PM   Specimen: BLOOD  Result Value Ref Range Status   Specimen Description   Final    BLOOD BLOOD RIGHT FOREARM Performed at [facility] [address], [place], [place] [address]    Special Requests   Final    BOTTLES DRAWN AEROBIC AND ANAEROBIC Blood Culture results may not be optimal due to an inadequate volume of blood received in culture bottles Performed at [facility] [address], [place], [place] [address]    Culture   Final    NO GROWTH 4 DAYS Performed at [facility], [place] [address] [place], [place]  [address]    Report Status PENDING  Incomplete  Blood Culture (routine x 2)     Status: None (Preliminary result)   Collection Time: 11/05/20  8:38 PM   Specimen: BLOOD  Result Value Ref Range Status   Specimen Description   Final    BLOOD Performed at [place] [address] [place], [place] [address]    Special Requests   Final    BOTTLES DRAWN AEROBIC AND ANAEROBIC Blood Culture results may not be optimal due to an inadequate volume of blood received in culture bottles Performed at [place] [address], [place], [place] [address]    Culture   Final    NO GROWTH 4 DAYS Performed at [facility], [place] [address], [place], [place] [address]    Report Status PENDING  Incomplete  Urine culture     Status: Abnormal   Collection Time: 11/05/20  8:38 PM   Specimen: In/Out Cath Urine  Result Value Ref Range Status   Specimen Description   Final    IN/OUT CATH URINE Performed at [facility] [address], [place], [place] [address]    Special Requests   Final    NONE Performed at [facility], [place] [address], [place], [place] [address]    Culture 60,000 COLONIES/mL ESCHERICHIA COLI (A)  Final   Report Status 11/08/2020 FINAL  Final   Organism ID, Bacteria ESCHERICHIA COLI (A)  Final      Susceptibility   Escherichia coli - MIC*    AMPICILLIN 4 SENSITIVE Sensitive     CEFAZOLIN <=4 SENSITIVE Sensitive     CEFEPIME <=0.12 SENSITIVE Sensitive     CEFTRIAXONE <=0.25 SENSITIVE Sensitive     CIPROFLOXACIN <=0.25 SENSITIVE Sensitive     GENTAMICIN <=1 SENSITIVE Sensitive     IMIPENEM <=0.25 SENSITIVE Sensitive     NITROFURANTOIN <=16 SENSITIVE Sensitive     TRIMETH/SULFA <=20 SENSITIVE Sensitive     AMPICILLIN/SULBACTAM <=2 SENSITIVE Sensitive     PIP/TAZO <=  4 SENSITIVE Sensitive     * 60,000 COLONIES/mL ESCHERICHIA COLI  MRSA PCR Screening     Status: None   Collection Time: 11/06/20   8:42 AM   Specimen: Nasopharyngeal  Result Value Ref Range Status   MRSA by PCR NEGATIVE NEGATIVE Final    Comment:        The GeneXpert MRSA Assay (FDA approved for NASAL specimens only), is one component of a comprehensive MRSA colonization surveillance program. It is not intended to diagnose MRSA infection nor to guide or monitor treatment for MRSA infections. Performed at [facility], [place] [address], [place], [place] [address]   Aerobic/Anaerobic Culture (surgical/deep wound)     Status: None (Preliminary result)   Collection Time: 11/06/20  3:45 PM   Specimen: Liver; Abscess  Result Value Ref Range Status   Specimen Description   Final    LIVER Performed at [facility] [address] [place], [place] [address]    Special Requests   Final    NONE Performed at [facility], [place] [address] [place], [place] [address]    Gram Stain   Final    FEW GRAM VARIABLE ROD Performed at [facility], [place] [address], [place], [place] [address]    Culture   Final    ABUNDANT KLEBSIELLA PNEUMONIAE ABUNDANT ESCHERICHIA COLI NO ANAEROBES ISOLATED; CULTURE IN PROGRESS FOR 5 DAYS    Report Status PENDING  Incomplete   Organism ID, Bacteria KLEBSIELLA PNEUMONIAE  Final   Organism ID, Bacteria ESCHERICHIA COLI  Final      Susceptibility   Escherichia coli - MIC*    AMPICILLIN <=2 SENSITIVE Sensitive     CEFAZOLIN <=4 SENSITIVE Sensitive     CEFEPIME <=0.12 SENSITIVE Sensitive     CEFTAZIDIME <=1 SENSITIVE Sensitive     CEFTRIAXONE <=0.25 SENSITIVE Sensitive     CIPROFLOXACIN <=0.25 SENSITIVE Sensitive     GENTAMICIN <=1 SENSITIVE Sensitive     IMIPENEM <=0.25 SENSITIVE Sensitive     TRIMETH/SULFA <=20 SENSITIVE Sensitive     AMPICILLIN/SULBACTAM <=2 SENSITIVE Sensitive     PIP/TAZO <=4 SENSITIVE Sensitive     * ABUNDANT ESCHERICHIA COLI   Klebsiella pneumoniae - MIC*    AMPICILLIN >=32 RESISTANT Resistant      CEFAZOLIN <=4 SENSITIVE Sensitive     CEFEPIME <=0.12 SENSITIVE Sensitive     CEFTAZIDIME <=1 SENSITIVE Sensitive     CEFTRIAXONE <=0.25 SENSITIVE Sensitive     CIPROFLOXACIN <=0.25 SENSITIVE Sensitive     GENTAMICIN <=1 SENSITIVE Sensitive     IMIPENEM <=0.25 SENSITIVE Sensitive     TRIMETH/SULFA <=20 SENSITIVE Sensitive     AMPICILLIN/SULBACTAM 16 INTERMEDIATE Intermediate     PIP/TAZO 16 SENSITIVE Sensitive     * ABUNDANT KLEBSIELLA PNEUMONIAE  Aerobic/Anaerobic Culture w Gram Stain (surgical/deep wound)     Status: None (Preliminary result)   Collection Time: 11/06/20  3:46 PM   Specimen: Liver; Abscess  Result Value Ref Range Status   Specimen Description LIVER ABSCESS  Final   Special Requests MORE INFERIOR RIGHT LIVER  Final   Gram Stain   Final    FEW WBC PRESENT, PREDOMINANTLY PMN FEW GRAM NEGATIVE RODS RARE GRAM POSITIVE COCCI IN PAIRS Performed at [facility], [place] [address], [place], [place] [address]    Culture   Final    ABUNDANT ESCHERICHIA COLI ABUNDANT STREPTOCOCCUS ANGINOSIS NO ANAEROBES ISOLATED; CULTURE IN PROGRESS FOR 5 DAYS    Report  Status PENDING  Incomplete   Organism ID, Bacteria ESCHERICHIA COLI  Final   Organism ID, Bacteria STREPTOCOCCUS ANGINOSIS  Final      Susceptibility   Escherichia coli - MIC*    AMPICILLIN 4 SENSITIVE Sensitive     CEFAZOLIN <=4 SENSITIVE Sensitive     CEFEPIME <=0.12 SENSITIVE Sensitive     CEFTAZIDIME <=1 SENSITIVE Sensitive     CEFTRIAXONE <=0.25 SENSITIVE Sensitive     CIPROFLOXACIN <=0.25 SENSITIVE Sensitive     GENTAMICIN <=1 SENSITIVE Sensitive     IMIPENEM <=0.25 SENSITIVE Sensitive     TRIMETH/SULFA <=20 SENSITIVE Sensitive     AMPICILLIN/SULBACTAM <=2 SENSITIVE Sensitive     PIP/TAZO <=4 SENSITIVE Sensitive     * ABUNDANT ESCHERICHIA COLI   Streptococcus anginosis - MIC*    PENICILLIN <=0.06 SENSITIVE Sensitive     CEFTRIAXONE 0.5 SENSITIVE Sensitive     ERYTHROMYCIN <=0.12 SENSITIVE Sensitive      LEVOFLOXACIN 0.5 SENSITIVE Sensitive     VANCOMYCIN 1 SENSITIVE Sensitive     * ABUNDANT STREPTOCOCCUS ANGINOSIS    Studies/Results: EEG adult  Result Date: 11/08/2020 Plancher, Baron Sane, MD     11/08/2020  4:46 PM PROCEDURE: EEG video routine 23 minutesstudy DATES OF TEST: 11/08/20 REASON FOR TEST: Seizure AED/Sedative MEDICATIONS: Keppra TECHNIQUE: This is an 18 channel digital EEG recording using the standard international 10/20 system of electrode placement with one channel EKG recording. Pt was awake and drowsy during this recording. FINDINGS: Background rhythm:symmetric 8.5-12 Hz  With intermittent asymmetric left 3-5 Hz Amplitude : normal Breach effect:  NO Variability: NO Reactivity: NO Rhythmic delta activity:NO Periodic discharges: NO Sporadic epileptiform discharges:NO Electrographic/electroclinical seizures:NO Limited EKG reveals no abnormalities. IMPRESSION: This is an abnormal study due to - 1. L Focal slowing is consistent with a focal disturbance of cerebral function and an underlying structural lesion should be considered. 2. No definite epileptiform discharges or electrographic seizures noted.   Overnight EEG with video  Result Date: 11/09/2020 Lora Havens, MD     11/10/2020  4:09 AM Patient Name: [facility] MRN: [id number] Epilepsy Attending: Lora Havens Referring Physician/Provider: Dr Roland Rack Duration: 11/08/2020 2157 to 11/09/2020 1008 Patient history: 65 year old male with a history of pancreatic adenocarcinoma with recurrence, recent fall and admission for sepsis, now with seizures. EEG to evaluate for seizure Level of alertness: Awake, asleep AEDs during EEG study: LEV Technical aspects: This EEG study was done with scalp electrodes positioned according to the 10-20 International system of electrode placement. Electrical activity was acquired at a sampling rate of 500Hz  and reviewed with a high frequency filter of 70Hz  and a low frequency filter of 1Hz . EEG  data were recorded continuously and digitally stored. Description: The posterior dominant rhythm consists of 9 Hz activity of moderate voltage (25-35 uV) seen predominantly in posterior head regions, symmetric and reactive to eye opening and eye closing.  Sleep was characterized by vertex waves, sleep spindles (12 to 14 Hz), maximal frontocentral region.  EEG showed intermittent 3 to 6 Hz theta-delta slowing in left temporal region. Hyperventilation and photic stimulation were not performed.   ABNORMALITY - Intermittent slow, left temporal region IMPRESSION: This study is suggestive of cortical dysfunction arising from left temporal region, nonspecific etiology but likely secondary to underlying subdural Hematoma. No seizures or definite epileptiform discharges were seen throughout the recording. Priyanka Barbra Sarks      Assessment/Plan:  INTERVAL HISTORY:   Patient has had Burr hole  craniotomy with evacuation of  what appears to be hematoma   Principal Problem:   Liver abscess Active Problems:   Essential hypertension   GERD without esophagitis   Mixed diabetic hyperlipidemia associated with type 2 diabetes mellitus (HCC)   Adenocarcinoma of head of pancreas (HCC)   Sepsis ([place])   Uncontrolled type 2 diabetes mellitus with ketoacidosis without coma, with long-term current use of insulin (HCC)   Increased anion gap metabolic acidosis   Chronic diastolic CHF (congestive heart failure) (HCC)   BPH with urinary obstruction   Pressure ulcer of sacral region, stage 2 (HCC)   Pneumonia of both lower lobes due to infectious organism   Sepsis secondary to UTI ([place])   Abscess   Empyema ([place])   Subdural hematoma (HCC)    Colton [facility] is a 65 y.o. male with  Hx of pancreatic cancer recent fall with Strep anginoisis in 1/2 blood cultures sp surgery to hip fracture, readmitted with sepsis and found to have liver abscesses sp IR guided drain.  Now felt been found on imaging have an area concerning  for subdural empyema.  He had been taken to the operating room since I saw him this morning undergone bur hole craniotomy with evacuation with what appears to be a hematoma instead.  We will continue with ceftriaxone and metronidazole and keep the ceftriaxone at high CNS dosing on the small chance that he might have had a bacterial infection in his hematoma.  I would like to give him 6 weeks of therapy which would also be sufficient to treat for endocarditis and should also be effective against his liver abscess.   I would like to see him before his antibiotics are stopped and I would also likely want to reimage his liver prior to stopping antibiotics  I will schedule hospital follow-up for him in my clinic.        LOS: 4 days   Alcide Evener 11/10/2020, 3:12 PM

## 2020-11-10 NOTE — Progress Notes (Signed)
Inpatient Rehab Admissions Coordinator Note:   Per therapy recommendations, pt was screened for CIR candidacy by Shann Medal, PT, DPT.  At this time we are recommending a CIR consult and I will place an order per our protocol.  Please contact me with questions.   Shann Medal, PT, DPT [phone] 11/10/20 3:13 PM

## 2020-11-10 NOTE — Evaluation (Signed)
Occupational Therapy Evaluation Patient Details Name: Colton [facility] MRN: [id number] DOB: 1956/07/17 Today's Date: 11/10/2020    History of Present Illness 65 y.o. male who presented to [facility] as a transfer from [facility], in which the pt presented to the ED 4/8 with AMS. Pt was found to be septic and initial source was thought to be another UTI but then further work-up revealed that he had a liver abscess(severe sepsis)  and pneumonia with bacteremia. S/p 2 drains placed in his R liver. EEG revealed L focal slowing and cortical dysfunction arising from L temporal region. MRI of head revealed moderate sized subdural hematoma. S/p L burr hole for evacuation of subdural hematoma 4/12. PMH pacreatic CA dx 2017, DM2 HTN HLD CHF 3/21-31/22 R hip IM repair after fall   Clinical Impression   Patient is s/p burrhole crani L surgery resulting in functional limitations due to the deficits listed below (see OT problem list). Pt with expressive language deficits and requires increased time to retrieve information when questioned. Pt was mod I with RW prior to admission 3/21 with fall and d/c SNF at that time. Pt declines further snf and reports he wants to go home. Recommendation for CIR for decreased burden of care and possible w/c level d/c 1 person (A) transfer with family.  Patient will benefit from skilled OT acutely to increase independence and safety with ADLS to allow discharge CIR.     Follow Up Recommendations  CIR    Equipment Recommendations  Wheelchair (measurements OT);Wheelchair cushion (measurements OT);3 in 1 bedside commode    Recommendations for Other Services Rehab consult     Precautions / Restrictions Precautions Precautions: Fall Precaution Comments: 2 JP drains R side Restrictions Weight Bearing Restrictions: Yes RLE Weight Bearing: Weight bearing as tolerated      Mobility Bed Mobility Overal bed mobility: Needs Assistance Bed Mobility: Supine to  Sit;Rolling Rolling: Total assist;Mod assist   Supine to sit: +2 for physical assistance;Mod assist;HOB elevated (25 degrees)     General bed mobility comments: pt requires (A) to faciliate rolling and to elevate from bed suface pt unable to flex R Knee. pt with bil LE lifted off bed to allow pt to come into sitting. pt throughout session with c/o of knee pain    Transfers Overall transfer level: Needs assistance Equipment used: 2 person hand held assist Transfers: Sit to/from [facility] Sit to Stand: +2 physical assistance;Mod assist;From elevated surface Stand pivot transfers: +2 physical assistance;Max assist;From elevated surface       General transfer comment: recommendation for total +2 Max (A) for basic transfer. pt elevated seat surface and drop arm chair to allow easier transfer options for RN staff. pt sit<>Stand from bed x2 and from chair to ensure he can go back to bed surface after sititng up.    Balance Overall balance assessment: Needs assistance Sitting-balance support: Bilateral upper extremity supported;Feet supported Sitting balance-[name]: Fair     Standing balance support: Bilateral upper extremity supported;During functional activity Standing balance-[name]: Poor Standing balance comment: R lateral lean and bias. pt attempts to correct unable to sustain                           ADL either performed or assessed with clinical judgement   ADL Overall ADL's : Needs assistance/impaired                     Lower Body  Dressing: Maximal assistance   Toilet Transfer: +2 for physical assistance;Maximal assistance             General ADL Comments: pt progressed from EOB to chair this session. pt with x4 sit<>Stand this session. pt able to achieve better static standing each attempt     Vision   Additional Comments: visually tracking therapist, denies any changes at this time     Perception     Praxis       Pertinent Vitals/Pain Pain Assessment: Faces Faces Pain Scale: Hurts whole lot Pain Location: R knee pain Pain Descriptors / Indicators: Grimacing;Tender Pain Intervention(s): Monitored during session;Repositioned;Ice applied (asks for L to be iced to)     Hand Dominance Right   Extremity/Trunk Assessment Upper Extremity Assessment Upper Extremity Assessment: Generalized weakness;LUE deficits/detail LUE Deficits / Details: aline in wrist so unable to use RW this session but rather hand held (A)   Lower Extremity Assessment Lower Extremity Assessment: RLE deficits/detail RLE Deficits / Details: requires blocking and reports pain with any flexion or movement   Cervical / Trunk Assessment Cervical / Trunk Assessment: Kyphotic   Communication Communication Communication: Expressive difficulties   Cognition Arousal/Alertness: Awake/alert Behavior During Therapy: Flat affect Overall Cognitive Status: Impaired/Different from baseline                                 General Comments: pt does not recognize doctor when asked. [facility] reports only meeting him twice but pt unaware of doctor present performed last procedure. Pt states "ives seen so many doctors"   General Comments  drain clamped to gown to keep them safe with transfers. Gait belt above drain sites. Pt with spirometer delivered during session after OT / RT discussion    Exercises     Shoulder Instructions      Home Living Family/patient expects to be discharged to:: Private residence Living Arrangements: Spouse/significant other (reports wife) Available Help at Discharge: Family;Available 24 hours/day Type of Home: House Home Access: Stairs to enter [employer] of Steps: (side of house) 1 step then laundry room then 2 steps to main level Entrance Stairs-Rails: None Home Layout: One level     Bathroom Shower/Tub: Walk-in shower;Tub/shower unit   Bathroom Toilet: Standard     Home  Equipment: None   Additional Comments: reports bad experience at SNF and refusing to consider transfer to SNF again. pt states It will take me longer but im going home.pt state "ive done more here already then i did the whole time there. no one go me up"      Prior Functioning/Environment Level of Independence: Needs assistance  Gait / Transfers Assistance Needed: limited mobility with RW ADL's / Homemaking Assistance Needed: needs assistance   Comments: reports could use a WC in the home        OT Problem List: Decreased strength;Decreased activity tolerance;Impaired balance (sitting and/or standing);Decreased safety awareness;Decreased knowledge of use of DME or AE;Decreased knowledge of precautions;Pain      OT Treatment/Interventions: Self-care/ADL training;Therapeutic exercise;Energy conservation;Neuromuscular education;DME and/or AE instruction;Manual therapy;Therapeutic activities;Cognitive remediation/compensation;Patient/family education;Balance training    OT Goals(Current goals can be found in the care plan section) Acute Rehab OT Goals Patient Stated Goal: to go home OT Goal Formulation: With patient Time For Goal Achievement: 11/25/20 Potential to Achieve Goals: Good  OT Frequency: Min 2X/week   Barriers to D/C: Other (comment) (uncertain of wifes physical (A) level)  Co-evaluation PT/OT/SLP Co-Evaluation/Treatment: Yes Reason for Co-Treatment: Complexity of the patient's impairments (multi-system involvement);Necessary to address cognition/behavior during functional activity;For patient/therapist safety   OT goals addressed during session: ADL's and self-care;Proper use of Adaptive equipment and DME;Strengthening/ROM      AM-PAC OT "6 Clicks" Daily Activity     Outcome Measure Help from another person eating meals?: A Little Help from another person taking care of personal grooming?: A Little Help from another person toileting, which includes using  toliet, bedpan, or urinal?: Total Help from another person bathing (including washing, rinsing, drying)?: A Lot Help from another person to put on and taking off regular upper body clothing?: A Lot Help from another person to put on and taking off regular lower body clothing?: Total 6 Click Score: 12   End of Session Equipment Utilized During Treatment: Gait belt Nurse Communication: Mobility status;Precautions  Activity Tolerance: Patient tolerated treatment well Patient left: in chair;with call bell/phone within reach;with chair alarm set  OT Visit Diagnosis: Unsteadiness on feet (R26.81);Other abnormalities of gait and mobility (R26.89);Muscle weakness (generalized) (M62.81);History of falling (Z91.81) Pain - Right/Left: Right Pain - part of body: Hip;Leg                Time: 1657-9038 OT Time Calculation (min): 22 min Charges:  OT General Charges $OT Visit: 1 Visit OT Evaluation $OT Eval Moderate Complexity: 1 Mod   Brynn, OTR/L  Acute Rehabilitation Services Pager: [phone] Office: [phone] .   Jeri Modena 11/10/2020, 10:01 AM

## 2020-11-10 NOTE — Progress Notes (Signed)
Referring Physician(s): Shalhoub, Governor Rooks [facility])  Supervising Physician: Corrie Mckusick  Patient Status:  [facility] - In-pt  Chief Complaint: Hepatic abscess drain x2 F/U  Subjective:  Hepatic abscesses s/p hepatic drain placement x2 in IR 11/06/2020. Patient awake and alert sitting in chair with no complaints at this time. PT/OT at bedside. Had eventful day yesterday- went to OR yesterday for burr hole for evacuation of subdural hematoma. Hepatic abscess drain sites c/d/i.   Allergies: Patient has no known allergies.  Medications: Prior to Admission medications   Medication Sig Start Date End Date Taking? Authorizing Provider  acetaminophen (TYLENOL) 500 MG tablet Take 1,000 mg by mouth every 6 (six) hours as needed for moderate pain or headache.   Yes [provider]  amLODipine (NORVASC) 10 MG tablet Take 1 tablet (10 mg total) by mouth daily. 01/13/20  Yes [name], MD  apixaban (ELIQUIS) 2.5 MG TABS tablet Take 1 tablet (2.5 mg total) by mouth 2 (two) times daily. 10/27/20 11/26/20 Yes Cherlynn June B, PA  benazepril (LOTENSIN) 10 MG tablet TAKE 1 TABLET DAILY Patient taking differently: Take 10 mg by mouth daily. 10/04/20  Yes [name], MD  CREON 6000-19000 units CPEP Take 1 capsule by mouth with breakfast, with lunch, and with evening meal.  04/15/20  Yes [provider]  diphenhydrAMINE HCl, Sleep, 25 MG TBDP Take 25 mg by mouth at bedtime as needed (sleep).   Yes [provider]  ferrous sulfate 325 (65 FE) MG tablet Take 325 mg daily with breakfast by mouth.   Yes [provider]  [employer] [facility]) OINT Apply 1 application topically 2 (two) times daily. T buttocks   Yes [provider]  insulin detemir (LEVEMIR) 100 UNIT/ML FlexPen Inject 25-35 Units into the skin See admin instructions. Inject 35 units in the morning and 25 units at night Patient taking differently: Inject 10-15 Units into the skin See  admin instructions. Takes 10 units in the morning and 15 units every evening 10/28/20  Yes [place] Donnamarie Poag, DO  loratadine (CLARITIN) 10 MG tablet Take 10 mg by mouth daily.   Yes [provider]  melatonin 3 MG TABS tablet Take 3 mg by mouth at bedtime.   Yes [provider]  Multiple Vitamin (MULTIVITAMIN WITH MINERALS) TABS tablet Take 1 tablet by mouth daily.   Yes [provider]  NOVOLOG FLEXPEN 100 UNIT/ML FlexPen GIVE EVERY MORNING WITH BREAKFAST AND EVERY EVENING WITH SUPPER PER SLIDING SCALE Patient taking differently: Inject 2-13 Units into the skin 2 (two) times daily. Sliding Scale : 101-150 - 2u 151-200 - 3u 201-250 - 5u 251-300 - 7u 301-350 - 9u 351- 400 - 11u 401-450 - 13u Greater than 150, alert MD 04/17/17  Yes [name], MD  Omega-3 Fatty Acids (FISH OIL) 500 MG CAPS Take 500 mg by mouth daily.   Yes [provider]  omeprazole (PRILOSEC) 20 MG capsule TAKE 1 CAPSULE DAILY Patient taking differently: Take 20 mg by mouth daily. 12/02/19  Yes [name], MD  oxyCODONE (OXY IR/ROXICODONE) 5 MG immediate release tablet Take 5 mg by mouth every 4 (four) hours as needed for severe pain or moderate pain.   Yes [provider]  potassium chloride SA (KLOR-CON M20) 20 MEQ tablet Take 1 tablet (20 mEq total) by mouth daily. 07/22/20  Yes [name], MD  rosuvastatin (CRESTOR) 20 MG tablet Take 1 tablet (20 mg total) by mouth daily.  07/22/20  Yes [name], MD  tamsulosin (FLOMAX) 0.4 MG CAPS capsule Take 1 capsule (0.4 mg total) by mouth daily. 10/29/20  Yes [place] Eric J, DO  B-D UF III MINI PEN NEEDLES 31G X 5 MM MISC Inject into the skin. 07/12/20   [provider]  docusate sodium (COLACE) 100 MG capsule Take 1 capsule (100 mg total) by mouth 2 (two) times daily. Patient not taking: Reported on 11/06/2020 10/28/20   [place] Donnamarie Poag, DO  FREESTYLE LITE test strip USE ONE STRIP TO CHECK GLUCOSE ONCE DAILY. PLEASE   SCHEDULE FOLLOW UP 11/09/16   [name], MD  Insulin Pen Needle 30G X 5 MM MISC Use one daily with insulin 03/04/16   Copland, Gay Filler, MD  Lancets (FREESTYLE) lancets USE AS INSTRUCTED TO CHECK BLOOD SUGAR ONCE A DAY 06/02/16   [name], MD     Vital Signs: BP 113/69   Pulse 60   Temp 98.3 F (36.8 C) (Oral)   Resp 16   Ht 5\' 10"  (1.778 m)   Wt 190 lb 4.1 oz (86.3 kg)   SpO2 99%   BMI 27.30 kg/m   Physical Exam Vitals and nursing note reviewed.  Constitutional:      General: He is not in acute distress. HENT:     Head:     Comments: Left frontal incision c/d/i. Pulmonary:     Effort: Pulmonary effort is normal. No respiratory distress.  Abdominal:     Comments: Superior (#1) and inferior (#2) hepatic abscess drain (#1) sites without erythema, drainage, or active bleeding. Superior hepatic abscess drain (#1) stable with minimal output of clear yellow fluid in suction bulb. Inferior hepatic abscess drain (#2) stable with minimal output of clear yellow fluid in suction bulb.  Skin:    General: Skin is warm and dry.  Neurological:     Mental Status: He is alert.     Imaging: CT HEAD WO CONTRAST  Result Date: 11/07/2020 CLINICAL DATA:  Subdural hematoma EXAM: CT HEAD WITHOUT CONTRAST TECHNIQUE: Contiguous axial images were obtained from the base of the skull through the vertex without intravenous contrast. COMPARISON:  Brain MRI same day FINDINGS: Brain: Left holohemispheric subdural collection is diffusely hypodense and measures 10 mm in thickness. Mass effect on the left hemisphere with only trace midline shift. No hydrocephalus. No intraparenchymal hemorrhage. Vascular: No hyperdense vessel or unexpected calcification. Skull: Normal. Negative for fracture or focal lesion. Sinuses/Orbits: No acute finding. Other: None. IMPRESSION: Left holohemispheric subdural collection measuring 10 mm in thickness with mild mass effect on the left hemisphere and only trace midline shift.  This is most consistent with a late subacute or chronic subdural hematoma. Electronically Signed   By: Ulyses Jarred M.D.   On: 11/07/2020 21:58   MR BRAIN WO CONTRAST  Addendum Date: 11/07/2020   ADDENDUM REPORT: 11/07/2020 20:54 ADDENDUM: Critical Value/emergent results were called by telephone at the time of interpretation on 11/07/2020 at 8:54 pm to provider Lovey Newcomer , who verbally acknowledged these results. Electronically Signed   By: Ulyses Jarred M.D.   On: 11/07/2020 20:54   Result Date: 11/07/2020 CLINICAL DATA:  Altered mental status EXAM: MRI HEAD WITHOUT CONTRAST TECHNIQUE: Multiplanar, multiecho pulse sequences of the brain and surrounding structures were obtained without intravenous contrast. COMPARISON:  None. FINDINGS: Brain: Left holo hemispheric extra-axial collection, likely hematoma, measuring 10 mm in thickness. Mild mass effect on the left hemisphere with minimal midline shift. There is some subarachnoid blood over  the left frontal lobe. Areas of abnormal diffusion restriction in the left temporal and parietal lobes. No intraparenchymal hemorrhage. There is multifocal hyperintense T2-weighted signal within the white matter. Generalized volume loss without a clear lobar predilection. The midline structures are normal. Vascular: Major flow voids are preserved. Skull and upper cervical spine: Normal calvarium and skull base. Visualized upper cervical spine and soft tissues are normal. Sinuses/Orbits:No paranasal sinus fluid levels or advanced mucosal thickening. No mastoid or middle ear effusion. Normal orbits. IMPRESSION: 1. Age indeterminate, mixed left subarachnoid and subdural hematoma measuring 10 mm in thickness. Head CT recommended for better temporal characterization. 2. Areas of suspected acute ischemia in the left temporal and parietal lobes, though diffusion-weighted imaging is complicated by the adjacent blood products and their magnetic susceptibility effects. Electronically  Signed: By: Ulyses Jarred M.D. On: 11/07/2020 20:33   DG CHEST PORT 1 VIEW  Result Date: 11/08/2020 CLINICAL DATA:  Dyspnea EXAM: PORTABLE CHEST 1 VIEW COMPARISON:  11/07/2020 FINDINGS: Mild right basilar atelectasis or infiltrate again noted, better appreciated on prior CT examination of 11/06/2020. Mild right-sided volume loss is unchanged. No pneumothorax or pleural effusion. Cardiac size within normal limits. Pulmonary vascularity is normal. Pigtail catheter noted overlying the right upper quadrant. IMPRESSION: Stable right-sided volume loss and mild right basilar atelectasis or infiltrate. Electronically Signed   By: Fidela Salisbury MD   On: 11/08/2020 06:57   DG CHEST PORT 1 VIEW  Result Date: 11/07/2020 CLINICAL DATA:  Shortness of breath EXAM: PORTABLE CHEST 1 VIEW COMPARISON:  November 05, 2020 chest radiograph; chest CT November 06, 2020 FINDINGS: There is airspace opacity in each lung base. Lungs elsewhere clear. Heart size and pulmonary vascularity normal. No adenopathy. There is lower thoracic dextroscoliosis. There is postoperative change in the lower cervical spine. IMPRESSION: Patchy airspace opacity in the lung bases, similar to 1 day prior but increased compared to 2 days prior. Stable cardiac silhouette. No adenopathy evident. Electronically Signed   By: Lowella Grip III M.D.   On: 11/07/2020 07:58   DG Swallowing Func-Speech Pathology  Result Date: 11/08/2020 Objective Swallowing Evaluation: Type of Study: MBS-Modified Barium Swallow Study  Patient Details Name: NASIIR MONTS MRN: [id number] Date of Birth: 31-Jan-1956 Today's Date: 11/08/2020 Time: SLP Start Time (ACUTE ONLY): 7793 -SLP Stop Time (ACUTE ONLY): 1055 SLP Time Calculation (min) (ACUTE ONLY): 15 min Past Medical History: Past Medical History: Diagnosis Date . Arthritis   left hand . Bronchitis 1977 . Cancer ([place]) 03/09/2016  pancreatic cancer, sees Dr. Cristino Martes at [facility]  . Depression   takes Cymbalta daily . Diabetes mellitus  type II   sees Dr. Chalmers Cater  . GERD (gastroesophageal reflux disease)   takes Omeprazole daily . H/O hiatal hernia  . Hyperlipidemia   takes Zocor daily . Hypertension   takes Amlodipine daily . Hypoglycemia 06/18/2017 . Neck pain   C4-7 stenosis and herniated disc . Neuromuscular disorder ([place])   hiatal hernia . Scoliosis   slight . Spinal cord injury, C5-C7 ([place])   c4-c7 . Stiffness of hand joint   d/t cervical issues Past Surgical History: Past Surgical History: Procedure Laterality Date . ANTERIOR CERVICAL DECOMP/DISCECTOMY FUSION  08/18/2011  Procedure: ANTERIOR CERVICAL DECOMPRESSION/DISCECTOMY FUSION 3 LEVELS;  Surgeon: Winfield Cunas, MD;  Location: [facility];  Service: Neurosurgery;  Laterality: N/A;  Anterior Cervical Four-Five/Five-Six/Six-Seven Decompression with Fusion, Plating, and Bonegraft . CARPAL TUNNEL RELEASE  2013  bilateral, per Dr. Christella Noa  . COLONOSCOPY  10-30-14  per Dr. Olevia Perches,  clear, repeat in 10 yrs  . egd with esophageal dilation  9-08  per Dr. Olevia Perches . ERCP N/A 03/01/2016  Procedure: ENDOSCOPIC RETROGRADE CHOLANGIOPANCREATOGRAPHY (ERCP) with brushings and stent;  Surgeon: Doran Stabler, MD;  Location: [facility];  Service: Endoscopy;  Laterality: N/A; . ESOPHAGOGASTRODUODENOSCOPY (EGD) WITH PROPOFOL N/A 05/27/2020  Procedure: ESOPHAGOGASTRODUODENOSCOPY (EGD) WITH PROPOFOL;  Surgeon: Milus Banister, MD;  Location: [facility];  Service: Endoscopy;  Laterality: N/A; . EUS N/A 03/09/2016  Procedure: ESOPHAGEAL ENDOSCOPIC ULTRASOUND (EUS) RADIAL;  Surgeon: Milus Banister, MD;  Location: [facility];  Service: Endoscopy;  Laterality: N/A; . EUS N/A 05/27/2020  Procedure: UPPER ENDOSCOPIC ULTRASOUND (EUS) RADIAL;  Surgeon: Milus Banister, MD;  Location: [facility];  Service: Endoscopy;  Laterality: N/A; . FEMUR IM NAIL Right 10/25/2020  Procedure: INTRAMEDULLARY (IM) NAIL FEMORAL;  Surgeon: Rod Can, MD;  Location: [facility];  Service: Orthopedics;  Laterality: Right; . lymph  nodes biopsy   . melanoma rt calf  1999 . PORT-A-CATH REMOVAL N/A 04/03/2019  Procedure: PORT REMOVAL;  Surgeon: Stark Klein, MD;  Location: [place];  Service: General;  Laterality: N/A; . PORTACATH PLACEMENT Left 03/22/2016  Procedure: INSERTION PORT-A-CATH;  Surgeon: Stark Klein, MD;  Location: [facility];  Service: General;  Laterality: Left; . SPINE SURGERY   . TONSILLECTOMY    as a child . ULNAR TUNNEL RELEASE  2013  right arm, per Dr. Christella Noa  . UPPER GASTROINTESTINAL ENDOSCOPY   . WHIPPLE PROCEDURE N/A 09/19/2016  Procedure: DIAGNOSTIC LAPAROSCOPY, LAPAROSCOPIC LIVER BIOPSY, RETROPERITONEAL EXPLORATION, INTRAOPERATIVE ULTRASOUND;  Surgeon: Stark Klein, MD;  Location: [place];  Service: General;  Laterality: N/A; HPI: Patient is a 65 year old Caucasian male with a past medical history significant for but not limited to pancreatic cancer diagnosed in 2017 with recent recurrence, diabetes mellitus type 2, hypertension, hyperlipidemia, chronic diastolic CHF with an EF of 55 to 60% and grade 1 diastolic dysfunction back in March 2022, recent right hip fracture status post repair and intramedullary nail as well as other comorbidities who presented to [facility] long ED with a chief complaint of a sudden change in mental status. In the ED is found to be septic and initial source was thought to be another UTI but then further work-up revealed that he had a liver abscess.  2 drains placed in his liver abscess  Subjective: alert but drowsy Assessment / Plan / Recommendation CHL IP CLINICAL IMPRESSIONS 11/08/2020 Clinical Impression Patient presents with a mild-mod oropharyngeal dysphagia with likely impact from current level of cognitive delays and decreased overall alertness. During oral phase, patient exhibited delays in anterior to posterior transit of thin liquids and puree solids boluses and with barium tablet in puree, exhibited mild premature spillage of puree into vallecular sinus likely due to decreased bolus cohesion.  trace to mild oral residuals post initial swallow which eventually cleared with uncued subsequent swallows. During pharyngeal phase of swallow, patient exhibited min vallecular residuals with puree solids, trace to min vallecular, pyriform and posterior pharynx residuals with thin liquids. Residuals were observed to fully clear pharynx during course of the study. Flash penetration was observed with thin liquids via straw sips and cup sips, however penetrate remained above vocal cords and fully cleared laryngeal vestibule. Barium tablet transited throught pharynx without difficulty. Slight narrowing of esophagus observed at level of approximately C4-6 where he has cervical hardware from h/o surgery. This narrowing did not result in significant stasis of boluses or significantly impact pharyngeal and upper esophageal transit. SLP recommending to  initiate full liquids diet and with plan to work on upgraded solids trials at bedside. SLP Visit Diagnosis -- Attention and concentration deficit following -- Frontal lobe and executive function deficit following -- Impact on safety and function --   CHL IP TREATMENT RECOMMENDATION 11/08/2020 Treatment Recommendations Therapy as outlined in treatment plan below   Prognosis 11/08/2020 Prognosis for Safe Diet Advancement Good Barriers to Reach Goals -- Barriers/Prognosis Comment -- CHL IP DIET RECOMMENDATION 11/08/2020 SLP Diet Recommendations Other (Comment);Thin liquid Liquid Administration via Cup;Straw Medication Administration Whole meds with puree Compensations Minimize environmental distractions;Slow rate;Small sips/bites Postural Changes Seated upright at 90 degrees   CHL IP OTHER RECOMMENDATIONS 11/08/2020 Recommended Consults -- Oral Care Recommendations Oral care BID;Staff/trained caregiver to provide oral care Other Recommendations --   CHL IP FOLLOW UP RECOMMENDATIONS 11/08/2020 Follow up Recommendations 24 hour supervision/assistance;Skilled Nursing facility   [facility] IP  FREQUENCY AND DURATION 11/08/2020 Speech Therapy Frequency (ACUTE ONLY) min 2x/week Treatment Duration 1 week      CHL IP ORAL PHASE 11/08/2020 Oral Phase -- Oral - Pudding Teaspoon -- Oral - Pudding Cup -- Oral - Honey Teaspoon -- Oral - Honey Cup -- Oral - Nectar Teaspoon -- Oral - Nectar Cup -- Oral - Nectar Straw -- Oral - Thin Teaspoon -- Oral - Thin Cup Delayed oral transit;Weak lingual manipulation;Holding of bolus;Reduced posterior propulsion;Lingual/palatal residue Oral - Thin Straw Lingual/palatal residue;Delayed oral transit;Weak lingual manipulation;Reduced posterior propulsion Oral - Puree Delayed oral transit;Weak lingual manipulation;Lingual/palatal residue;Reduced posterior propulsion Oral - Mech Soft -- Oral - Regular -- Oral - Multi-Consistency -- Oral - Pill Decreased bolus cohesion;Delayed oral transit;Reduced posterior propulsion;Weak lingual manipulation;Premature spillage Oral Phase - Comment --  CHL IP PHARYNGEAL PHASE 11/08/2020 Pharyngeal Phase -- Pharyngeal- Pudding Teaspoon -- Pharyngeal -- Pharyngeal- Pudding Cup -- Pharyngeal -- Pharyngeal- Honey Teaspoon -- Pharyngeal -- Pharyngeal- Honey Cup -- Pharyngeal -- Pharyngeal- Nectar Teaspoon -- Pharyngeal -- Pharyngeal- Nectar Cup -- Pharyngeal -- Pharyngeal- Nectar Straw -- Pharyngeal -- Pharyngeal- Thin Teaspoon -- Pharyngeal -- Pharyngeal- Thin Cup Pharyngeal residue - valleculae;Pharyngeal residue - pyriform;Pharyngeal residue - posterior pharnyx Pharyngeal Material enters airway, remains ABOVE vocal cords then ejected out Pharyngeal- Thin Straw Penetration/Aspiration during swallow;Pharyngeal residue - pyriform;Pharyngeal residue - posterior pharnyx;Pharyngeal residue - valleculae Pharyngeal Material enters airway, remains ABOVE vocal cords then ejected out Pharyngeal- Puree Pharyngeal residue - valleculae Pharyngeal -- Pharyngeal- Mechanical Soft -- Pharyngeal -- Pharyngeal- Regular -- Pharyngeal -- Pharyngeal- Multi-consistency --  Pharyngeal -- Pharyngeal- Pill WFL Pharyngeal -- Pharyngeal Comment --  CHL IP CERVICAL ESOPHAGEAL PHASE 11/08/2020 Cervical Esophageal Phase Impaired Pudding Teaspoon -- Pudding Cup -- Honey Teaspoon -- Honey Cup -- Nectar Teaspoon -- Nectar Cup -- Nectar Straw -- Thin Teaspoon -- Thin Cup Other (Comment) Thin Straw -- Puree -- Mechanical Soft -- Regular -- Multi-consistency -- Pill -- Cervical Esophageal Comment -- Sonia Baller, MA, CCC-SLP Speech Therapy             EEG adult  Result Date: 11/08/2020 Plancher, Baron Sane, MD     11/08/2020  4:46 PM PROCEDURE: EEG video routine 23 minutesstudy DATES OF TEST: 11/08/20 REASON FOR TEST: Seizure AED/Sedative MEDICATIONS: Keppra TECHNIQUE: This is an 18 channel digital EEG recording using the standard international 10/20 system of electrode placement with one channel EKG recording. Pt was awake and drowsy during this recording. FINDINGS: Background rhythm:symmetric 8.5-12 Hz  With intermittent asymmetric left 3-5 Hz Amplitude : normal Breach effect:  NO Variability: NO Reactivity: NO Rhythmic delta activity:NO Periodic discharges:  NO Sporadic epileptiform discharges:NO Electrographic/electroclinical seizures:NO Limited EKG reveals no abnormalities. IMPRESSION: This is an abnormal study due to - 1. L Focal slowing is consistent with a focal disturbance of cerebral function and an underlying structural lesion should be considered. 2. No definite epileptiform discharges or electrographic seizures noted.   Overnight EEG with video  Result Date: 11/09/2020 Lora Havens, MD     11/10/2020  4:09 AM Patient Name: ARIN PERAL MRN: [id number] Epilepsy Attending: Lora Havens Referring Physician/Provider: Dr Roland Rack Duration: 11/08/2020 2157 to 11/09/2020 1008 Patient history: 65 year old male with a history of pancreatic adenocarcinoma with recurrence, recent fall and admission for sepsis, now with seizures. EEG to evaluate for seizure Level of  alertness: Awake, asleep AEDs during EEG study: LEV Technical aspects: This EEG study was done with scalp electrodes positioned according to the 10-20 International system of electrode placement. Electrical activity was acquired at a sampling rate of 500Hz  and reviewed with a high frequency filter of 70Hz  and a low frequency filter of 1Hz . EEG data were recorded continuously and digitally stored. Description: The posterior dominant rhythm consists of 9 Hz activity of moderate voltage (25-35 uV) seen predominantly in posterior head regions, symmetric and reactive to eye opening and eye closing.  Sleep was characterized by vertex waves, sleep spindles (12 to 14 Hz), maximal frontocentral region.  EEG showed intermittent 3 to 6 Hz theta-delta slowing in left temporal region. Hyperventilation and photic stimulation were not performed.   ABNORMALITY - Intermittent slow, left temporal region IMPRESSION: This study is suggestive of cortical dysfunction arising from left temporal region, nonspecific etiology but likely secondary to underlying subdural Hematoma. No seizures or definite epileptiform discharges were seen throughout the recording. Lora Havens   CT IMAGE GUIDED DRAINAGE BY PERCUTANEOUS CATHETER  Result Date: 11/08/2020 INDICATION: 65 year old male with a history hepatic abscess referred for drainage EXAM: CT GUIDED DRAINAGE OF HEPATIC ABSCESS MEDICATIONS: The patient is currently admitted to the hospital and receiving intravenous antibiotics. The antibiotics were administered within an appropriate time frame prior to the initiation of the procedure. ANESTHESIA/SEDATION: 0 mg IV Versed 25 mcg IV Fentanyl Moderate Sedation Time:  0 The patient was continuously monitored during the procedure by the interventional radiology nurse under my direct supervision. COMPLICATIONS: None TECHNIQUE: Informed written consent was obtained from the patient after a thorough discussion of the procedural risks, benefits and  alternatives. All questions were addressed. Maximal Sterile Barrier Technique was utilized including caps, mask, sterile gowns, sterile gloves, sterile drape, hand hygiene and skin antiseptic. A timeout was performed prior to the initiation of the procedure. PROCEDURE: CT of the liver was performed with the patient in right anterior oblique position. The right upper quadrant was prepped with chlorhexidine in a sterile fashion, and a sterile drape was applied covering the operative field. A sterile gown and sterile gloves were used for the procedure. Local anesthesia was provided with 1% Lidocaine. Once we determine appropriate angle of approach for drainage, 1% lidocaine was used for local anesthesia. Using CT guidance, 2 separate 18 gauge trocar needles were advanced intercostal location towards the posterior right hepatic abscess. Once we confirmed needle tip position, modified Seldinger technique was used to place 2 separate 10 French pigtail drainage catheters. Aspiration of purulence fluid at both location confirmed adequate positioning. Both drains were sutured in position and attached to bulb suction. Final CT was acquired. Patient tolerated procedure well and remained hemodynamically stable throughout. No complications were encountered and no significant blood loss.  FINDINGS: CT confirms 2 hypoechoic fluid collection in the posterior right liver. After CT-guided drainage, pigtail catheters remain adequately position with collapse of both cavities. Sample was sent from both sites. IMPRESSION: Status post CT-guided drainage of 2 separate right-sided hepatic abscess. Signed, Dulcy Fanny. Dellia Nims, RPVI [facility] Electronically Signed   By: Corrie Mckusick D.O.   On: 11/08/2020 08:21   ECHOCARDIOGRAM LIMITED  Result Date: 11/08/2020    ECHOCARDIOGRAM REPORT   Patient Name:   MYLON MABEY Date of Exam: 11/06/2020 Medical Rec #:  [id number]      Height:        70.0 in Accession #:    [id number]     Weight:       174.2 lb Date of Birth:  05-Jan-1956      BSA:          1.968 m Patient Age:    65 years       BP:           118/71 mmHg Patient Gender: M              HR:           108 bpm. Exam Location:  Inpatient Procedure: Limited Echo, Cardiac Doppler and Limited Color Doppler Indications:    Bacteremia  History:        Patient has prior history of Echocardiogram examinations, most                 recent 10/22/2020. Sepsis; Risk Factors:Hypertension, Diabetes                 and Dyslipidemia.  Sonographer:    Johny Chess Referring Phys: [place]  1. Limited echo, poor endocardial definition. Defininty contrast not used.  2. Left ventricular ejection fraction, by estimation, is 60 to 65%. The left ventricle has normal function. Left ventricular endocardial border not optimally defined to evaluate regional wall motion. There is mild left ventricular hypertrophy. Indeterminate diastolic filling due to E-A fusion.  3. Right ventricular systolic function is hyperdynamic. The right ventricular size is normal.  4. The mitral valve is abnormal. No evidence of mitral valve regurgitation.  5. The aortic valve is tricuspid. Aortic valve regurgitation is not visualized. Comparison(s): Changes from prior study are noted. 10/02/2020: LVEF 55-60%. Conclusion(s)/Recommendation(s): No evidence of valvular vegetations on this transthoracic echocardiogram, however, visualization of the valves was quite limited. Would recommend a transesophageal echocardiogram to exclude infective endocarditis if clinically indicated. FINDINGS  Left Ventricle: Left ventricular ejection fraction, by estimation, is 60 to 65%. The left ventricle has normal function. Left ventricular endocardial border not optimally defined to evaluate regional wall motion. The left ventricular internal cavity size was normal in size. There is mild left ventricular hypertrophy. Indeterminate  diastolic filling due to E-A fusion. Right Ventricle: The right ventricular size is normal. No increase in right ventricular wall thickness. Right ventricular systolic function is hyperdynamic. Left Atrium: Left atrial size was not well visualized. Right Atrium: Right atrial size was not well visualized. Pericardium: There is no evidence of pericardial effusion. Mitral Valve: The mitral valve is abnormal. There is mild thickening of the mitral valve leaflet(s). No evidence of mitral valve regurgitation. Tricuspid Valve: The tricuspid valve is not well visualized. Tricuspid valve regurgitation is not demonstrated. Aortic Valve: The aortic valve is tricuspid. Aortic valve regurgitation is not visualized. Pulmonic Valve: The pulmonic valve was not well visualized. Pulmonic valve regurgitation  is not visualized. Aorta: The aortic arch was not well visualized and the ascending aorta was not well visualized. Venous: The inferior vena cava was not well visualized. IAS/Shunts: No atrial level shunt detected by color flow Doppler. Lyman Bishop MD Electronically signed by Lyman Bishop MD Signature Date/Time: 11/08/2020/10:13:44 AM    Final     Labs:  CBC: Recent Labs    11/07/20 0124 11/08/20 8588 11/09/20 0132 11/09/20 1251 11/10/20 0705  WBC 22.6* 11.7* 10.3  --  9.6  HGB 10.3* 9.9* 9.6* 10.2* 10.0*  HCT 31.7* 31.1* 29.4* 30.0* 30.6*  PLT 470* 410* 386  --  415*    COAGS: Recent Labs    10/19/20 0259 10/24/20 0814 11/05/20 1930 11/06/20 0337  INR 1.4* 1.2 1.5* 1.6*  APTT  --   --  32 36    BMP: Recent Labs    04/14/20 0925 07/09/20 0825 11/07/20 0124 11/08/20 0237 11/09/20 0132 11/09/20 0801 11/09/20 1251  NA 141   < > 137 136 136 137 140  K 3.3*   < > 3.2* 2.7* 3.2* 3.0* 3.0*  CL 107   < > 107 104 103 101  --   CO2 27   < > 21* 24 27 28   --   GLUCOSE 73   < > 169* 79 140* 179*  --   BUN 9   < > 14 10 5* <5*  --   CALCIUM 8.8*   < > 8.2* 7.8* 7.5* 7.7*  --   CREATININE 0.71    < > 0.43* 0.45* 0.44* 0.42*  --   GFRNONAA >60   < > >60 >60 >60 >60  --   GFRAA >60  --   --   --   --   --   --    < > = values in this interval not displayed.    LIVER FUNCTION TESTS: Recent Labs    11/06/20 1424 11/07/20 0124 11/08/20 0237 11/09/20 0132  BILITOT 0.1* 0.8 0.7 0.6  AST 29 45* 50* 27  ALT 22 28 40 31  ALKPHOS 252* 230* 258* 234*  PROT 6.7 6.2* 6.2* 5.6*  ALBUMIN 2.2* 2.1* 2.1* 1.7*    Assessment and Plan:  Hepatic abscesses s/p hepatic drain placement x2 in IR 11/06/2020. Superior hepatic abscess drain (#1) stable with minimal output of clear yellow fluid in suction bulb (additional 10 cc output from drain in past 24 hours per chart). Inferior hepatic abscess drain (#2) stable with minimal output of clear yellow fluid in suction bulb (additional 10 cc output from drain in past 24 hours per chart). Continue current drain management- continue with Qshift flushes/monitor of output. Plan for repeat CT when output <10 cc/day (assess for possible removal). Further plans per [facility]- appreciate and agree with management. IR to follow.   Electronically Signed: Earley Abide, PA-C 11/10/2020, 9:08 AM   I spent a total of 15 Minutes at the the patient's bedside AND on the patient's hospital floor or unit, greater than 50% of which was counseling/coordinating care for hepatic abscesses s/p hepatic drain placement x2.

## 2020-11-10 NOTE — Progress Notes (Signed)
  Speech Language Pathology Treatment: Dysphagia  Patient Details Name: ZECHARIAH BISSONNETTE MRN: [id number] DOB: 11-27-1955 Today's Date: 11/10/2020 Time: 4010-2725 SLP Time Calculation (min) (ACUTE ONLY): 15 min  Assessment / Plan / Recommendation Clinical Impression  Significant improvements from MBS on 4/11 in regards to oral phase of swallow. His control and cohesion were adequate. Intermittently, his transit was rapid with suspected partial mastication but able to clear and needed min cueing to ensure solids are adequately broken down. No s/s aspiration with straw sips thin. Upgraded texture to regular, continue thin, pills with thin and brief follow up. SLP will request order to assess language and cognition.    HPI HPI: Patient is a 65 year old Caucasian male with a past medical history significant for but not limited to pancreatic cancer diagnosed in 2017 with recent recurrence, diabetes mellitus type 2, hypertension, hyperlipidemia, chronic diastolic CHF with an EF of 55 to 60% and grade 1 diastolic dysfunction back in March 2022, recent right hip fracture status post repair and intramedullary nail as well as other comorbidities who presented to [facility] long ED with a chief complaint of a sudden change in mental status. In the ED is found to be septic and initial source was thought to be another UTI but then further work-up revealed that he had a liver abscess.  2 drains placed in his liver abscess      SLP Plan  Continue with current plan of care;MBS       Recommendations  Diet recommendations: Regular;Thin liquid Liquids provided via: Cup;Straw Medication Administration: Whole meds with liquid Supervision: Patient able to self feed;Intermittent supervision to cue for compensatory strategies Compensations: Slow rate;Small sips/bites Postural Changes and/or Swallow Maneuvers: Seated upright 90 degrees                Oral Care Recommendations: Oral care BID Follow up  Recommendations:  (none for swallow) SLP Visit Diagnosis: Dysphagia, unspecified (R13.10) Plan: Continue with current plan of care;MBS       GO                Houston Siren 11/10/2020, 3:58 PM

## 2020-11-10 NOTE — Progress Notes (Signed)
Neurosurgery Service Progress Note  Subjective: No acute events overnight, no new complaints this morning, was up working with PT when I was rounding   Objective: Vitals:   11/10/20 0500 11/10/20 0700 11/10/20 0753 11/10/20 0800  BP: 120/64 118/65  113/69  Pulse: (!) 58 64  60  Resp: 15 16  16   Temp:    98.3 F (36.8 C)  TempSrc:    Oral  SpO2: 97% 95% 94% 99%  Weight:      Height:        Physical Exam: Sitting up at bedside with assistance, FCx4, speech with decreased overall output but good comprehension, incision c/d/i  Assessment & Plan: 65 y.o. man s/p burr hole evacuation of subdural empyema, intra-op findings c/w subdural hematoma and not empyema.   -okay to transfer out of the ICU to regular floor bed from my standpoint -okay for DVT chemoPPx tomorrow 4/14, hold full anticoagulation until [place]  11/10/20 8:41 AM

## 2020-11-10 NOTE — Progress Notes (Signed)
Subjective: SDH evacuated yesterday  Exam: Vitals:   11/10/20 1600 11/10/20 1700  BP: (!) 124/101 110/88  Pulse: 82 63  Resp: 18 13  Temp:    SpO2: 100% (!) 79%   Gen: In bed, NAD Resp: non-labored breathing, no acute distress Abd: soft, nt  Neuro: MS: Awake, alert, Speech is still hesitant but still significantly improved.  CN: R pupil very slightly larger than left, both are reactive, VFF Motor: No drift.  Sensory:reports symmetric sensation, though unclear on reliability   Impression: 65 year old male with subdural hematoma s/p evacuation with preop seizures. His speech seems better than yesterday. I would continue keppra for now.   Recommendations: 1) continue Keppra 1 g twice daily 2) will follow.   Roland Rack, MD [facility] [phone]  If 7pm- 7am, please page neurology on call as listed in [place].

## 2020-11-10 NOTE — Progress Notes (Signed)
PHARMACY CONSULT NOTE FOR:  OUTPATIENT  PARENTERAL ANTIBIOTIC THERAPY (OPAT)  Indication: liver abscess and possible infected subdural hematoma Regimen: CTX 2gm IV Q12H and Flagyl 500mg  PO TID End date: 12/21/20  IV antibiotic discharge orders are pended. To discharging provider:  please sign these orders via discharge navigator,  Select New Orders & click on the button choice - Manage This Unsigned Work.     Agueda Houpt D. Mina Marble, PharmD, BCPS, [place] 11/10/2020, 4:58 PM

## 2020-11-10 NOTE — Progress Notes (Addendum)
PROGRESS NOTE    CHRISTROPHER GINTZ   RKY:[id number]  DOB: Apr 11, 1956  DOA: 11/05/2020 PCP: Wenda Low, MD   Brief Narrative:  Colton Bush is a 65 year old male with recurrent pancreatic cancer, diabetes mellitus type 2, chronic diastolic heart failure (grade 1), recent right hip fracture status post IM nail, hypertension and hyperlipidemia. The patient presented from [facility] on 4/8 for confusion and was found to have DKA and sepsis.  He was found to have strep angina gnosis bacteremia and a liver abscess along with bilateral lower lobe pneumonia.  On 4/10 an MRI of the brain was obtained due to ongoing confusion and he was found to have an age-indeterminate mixed left subarachnoid and subdural hematoma measuring 10 mm in thickness.  A head CT was recommended for better characterization.  Also noted were areas of suspected acute ischemia in the left temporal and parietal lobes. That same night, he was subsequently noted to have 2 seizure-like episodes and neurology was consulted.  He was loaded with Keppra which resulted in cessation of the seizure-like activity.  He was transferred to [facility] for a continuous EEG.  On 4/12 he was evaluated by neurosurgery and there was concern that he may have a subdural empyema rather than a hematoma.  Chelation in the afternoon and appears that mostly chronic blood with some membranes was found.  There was no evidence of purulent material.  Subjective: Has no complaints.    Assessment & Plan:   Principal Problem:   Liver abscess status post JP drain x2   Strep anginosis  bacteremia  Bilateral lower lobe pulmonary infiltrates Subdural hematoma -ID recommends ceftriaxone (in case he has an infected subdural hematoma) and oral metronidazole for total of 6 weeks -ID plans to follow-up prior to stopping the antibiotics and obtain reimaging of his liver to make a decision on whether to stop antibiotics -2 hepatic drains are present  Active  Problems: Acute toxic encephalopathy -Secondary to infection and subdural hematoma/empyema  Subdural hematoma -Question if this is infected-status post bur holes on 4/12-neurosurgery following  Probable seizures -Continue Keppra per neurology -EEG 4/12: Suggestive of cortical dysfunction arising from the temporal region-no seizure or definite epileptiform discharges  Uncontrolled diabetes mellitus with hyperglycemia-presented with DKA -Hemoglobin A1c on 3/21 was 10.8 -Was noted to be hypoglycemic with CBG of 68 on 4/12 (was n.p.o.) -CBGs have been in the 300s since yesterday -He was taking 10 units of Levemir in the morning and 15 in the evening at home with NovoLog sliding scale with meals -He is receiving Lantus 10 units daily here-we will increase this to 10 units twice daily -Change sliding scale to 3 times daily with meals moderate dose    Essential hypertension -Amlodipine & benazepril hold-BP is not elevated    GERD without esophagitis -Continue PPI    Mixed diabetic hyperlipidemia associated with type 2 diabetes mellitus  -Continue statin    Adenocarcinoma of head of pancreas in 2/22 -Status post Whipple's procedure chemo and radiation -His current recurrence is nonresectable radiation was planned but has not been able to be started due to his hip fracture and his current issue - CT shows that his remaining pancreas is atrophic -He is on Creon replacement with meals  BPH with urinary obstruction -Continue Flomax -Has a Foley catheter  Recent hip fracture -Status post IM nail -Continue PT OT  -Continue wound care recommendations for below injuries Pressure Injury 11/07/20 Heel Right Stage 1 -  Intact skin with non-blanchable redness  of a localized area usually over a bony prominence. (Active)  11/07/20 0800  Location: Heel  Location Orientation: Right  Staging: Stage 1 -  Intact skin with non-blanchable redness of a localized area usually over a bony prominence.   Wound Description (Comments):   Present on Admission:      Pressure Injury 11/07/20 Sacrum Stage 1 -  Intact skin with non-blanchable redness of a localized area usually over a bony prominence. (Active)  11/07/20 2000  Location: Sacrum  Location Orientation:   Staging: Stage 1 -  Intact skin with non-blanchable redness of a localized area usually over a bony prominence.  Wound Description (Comments):   Present on Admission:      Pressure Injury 11/07/20 Heel Right Deep Tissue Pressure Injury - Purple or maroon localized area of discolored intact skin or blood-filled blister due to damage of underlying soft tissue from pressure and/or shear. (Active)  11/07/20 2000  Location: Heel  Location Orientation: Right  Staging: Deep Tissue Pressure Injury - Purple or maroon localized area of discolored intact skin or blood-filled blister due to damage of underlying soft tissue from pressure and/or shear.  Wound Description (Comments):   Present on Admission:      Pressure Injury 11/07/20 Sacrum Deep Tissue Pressure Injury - Purple or maroon localized area of discolored intact skin or blood-filled blister due to damage of underlying soft tissue from pressure and/or shear. (Active)  11/07/20 2000  Location: Sacrum  Location Orientation:   Staging: Deep Tissue Pressure Injury - Purple or maroon localized area of discolored intact skin or blood-filled blister due to damage of underlying soft tissue from pressure and/or shear.  Wound Description (Comments):   Present on Admission:            Time spent in minutes: 40 DVT prophylaxis: Place and maintain sequential compression device Start: 11/09/20 1051  Code Status: Full code Family Communication:  Level of Care: Level of care: ICU Disposition Plan:  Status is: Inpatient  Remains inpatient appropriate because:IV treatments appropriate due to intensity of illness or inability to take PO   Dispo: The patient is from: SNF               Anticipated d/c is to: CIR recommended by PT              Patient currently is not medically stable to d/c.   Difficult to place patient No      Consultants:   ID  Neuro  NS Procedures:   Burr holes  Abdominal drains Antimicrobials:  Anti-infectives (From admission, onward)   Start     Dose/Rate Route Frequency Ordered Stop   11/09/20 1000  cefTRIAXone (ROCEPHIN) 2 g in sodium chloride 0.9 % 100 mL IVPB        2 g 200 mL/hr over 30 Minutes Intravenous Every 12 hours 11/09/20 0828     11/09/20 0915  metroNIDAZOLE (FLAGYL) tablet 500 mg        500 mg Oral Every 8 hours 11/09/20 0828     11/07/20 2200  Ampicillin-Sulbactam (UNASYN) 3 g in sodium chloride 0.9 % 100 mL IVPB  Status:  Discontinued        3 g 200 mL/hr over 30 Minutes Intravenous Every 6 hours 11/06/20 2135 11/07/20 0943   11/07/20 1800  Ampicillin-Sulbactam (UNASYN) 3 g in sodium chloride 0.9 % 100 mL IVPB  Status:  Discontinued        3 g 200 mL/hr over 30 Minutes Intravenous Every  6 hours 11/07/20 1219 11/09/20 0828   11/07/20 1600  Ampicillin-Sulbactam (UNASYN) 3 g in sodium chloride 0.9 % 100 mL IVPB  Status:  Discontinued        3 g 200 mL/hr over 30 Minutes Intravenous Every 6 hours 11/07/20 0943 11/07/20 1219   11/07/20 1030  Ampicillin-Sulbactam (UNASYN) 3 g in sodium chloride 0.9 % 100 mL IVPB        3 g 200 mL/hr over 30 Minutes Intravenous  Once 11/07/20 0943 11/07/20 1213   11/06/20 1800  Ampicillin-Sulbactam (UNASYN) 3 g in sodium chloride 0.9 % 100 mL IVPB  Status:  Discontinued        3 g 200 mL/hr over 30 Minutes Intravenous Every 6 hours 11/06/20 1442 11/06/20 2135   11/06/20 1100  cefTRIAXone (ROCEPHIN) 2 g in sodium chloride 0.9 % 100 mL IVPB  Status:  Discontinued        2 g 200 mL/hr over 30 Minutes Intravenous Every 24 hours 11/06/20 0855 11/06/20 1435   11/06/20 0800  vancomycin (VANCOREADY) IVPB 1250 mg/250 mL  Status:  Discontinued        1,250 mg 166.7 mL/hr over 90 Minutes  Intravenous Every 12 hours 11/06/20 0643 11/06/20 0854   11/06/20 0700  metroNIDAZOLE (FLAGYL) IVPB 500 mg  Status:  Discontinued        500 mg 100 mL/hr over 60 Minutes Intravenous Every 8 hours 11/06/20 0638 11/06/20 1452   11/06/20 0400  ceFEPIme (MAXIPIME) 2 g in sodium chloride 0.9 % 100 mL IVPB  Status:  Discontinued        2 g 200 mL/hr over 30 Minutes Intravenous Every 8 hours 11/05/20 2358 11/06/20 0853   11/05/20 1845  vancomycin (VANCOREADY) IVPB 1500 mg/300 mL        1,500 mg 150 mL/hr over 120 Minutes Intravenous  Once 11/05/20 1830 11/05/20 2237   11/05/20 1830  ceFEPIme (MAXIPIME) 2 g in sodium chloride 0.9 % 100 mL IVPB        2 g 200 mL/hr over 30 Minutes Intravenous  Once 11/05/20 1824 11/05/20 2103   11/05/20 1830  metroNIDAZOLE (FLAGYL) IVPB 500 mg        500 mg 100 mL/hr over 60 Minutes Intravenous  Once 11/05/20 1824 11/05/20 2140   11/05/20 1830  vancomycin (VANCOCIN) IVPB 1000 mg/200 mL premix  Status:  Discontinued        1,000 mg 200 mL/hr over 60 Minutes Intravenous  Once 11/05/20 1824 11/05/20 1830       Objective: Vitals:   11/10/20 1300 11/10/20 1400 11/10/20 1500 11/10/20 1600  BP: 122/65 (!) 106/57  (!) 124/101  Pulse: 73 87 96 82  Resp: 14 19 (!) 22 18  Temp:      TempSrc:      SpO2: 95% 98% 100% 100%  Weight:      Height:        Intake/Output Summary (Last 24 hours) at 11/10/2020 1634 Last data filed at 11/10/2020 1600 Gross per 24 hour  Intake 1462.64 ml  Output 2695 ml  Net -1232.36 ml   Filed Weights   11/06/20 0800 11/06/20 0903 11/08/20 2103  Weight: 79 kg 79 kg 86.3 kg    Examination: General exam: Appears comfortable  HEENT: PERRLA, oral mucosa moist, no sclera icterus or thrush Respiratory system: Clear to auscultation. Respiratory effort normal. Cardiovascular system: S1 & S2 heard, RRR.   Gastrointestinal system: Abdomen soft, non-tender, nondistended. Normal bowel sounds. Central nervous system: Alert-difficult to  tell  if he is Berna Bue is slow and broken-follow some commands. Extremities: No cyanosis, clubbing or edema Skin: No rashes or ulcers Psychiatry: Unable to assess  Data Reviewed: I have personally reviewed following labs and imaging studies  CBC: Recent Labs  Lab 11/05/20 1930 11/05/20 2007 11/06/20 0337 11/07/20 0124 11/08/20 0237 11/09/20 0132 11/09/20 1251 11/10/20 0705 11/10/20 0829  WBC 21.3*  --  31.0* 22.6* 11.7* 10.3  --  9.6 11.0*  NEUTROABS 20.1*  --  29.4* 20.9* 9.9* 8.5*  --   --   --   HGB 12.5*   < > 11.0* 10.3* 9.9* 9.6* 10.2* 10.0* 10.9*  HCT 39.1   < > 35.6* 31.7* 31.1* 29.4* 30.0* 30.6* 33.5*  MCV 94.7  --  95.4 93.0 93.4 91.6  --  91.9 90.8  PLT 529*  --  635* 470* 410* 386  --  415* 469*   < > = values in this interval not displayed.   Basic Metabolic Panel: Recent Labs  Lab 11/06/20 1424 11/06/20 1820 11/07/20 0124 11/08/20 0237 11/09/20 0132 11/09/20 0801 11/09/20 1251 11/10/20 0705 11/10/20 0829  NA 139   < > 137 136 136 137 140 136 135  K 3.0*   < > 3.2* 2.7* 3.2* 3.0* 3.0* 3.3* 3.6  CL 110   < > 107 104 103 101  --  102 101  CO2 21*   < > 21* 24 27 28   --  30 26  GLUCOSE 139*   < > 169* 79 140* 179*  --  209* 210*  BUN 15   < > 14 10 5* <5*  --  6* 7*  CREATININE 0.57*   < > 0.43* 0.45* 0.44* 0.42*  --  0.42* 0.44*  CALCIUM 8.3*   < > 8.2* 7.8* 7.5* 7.7*  --  8.0* 8.0*  MG 1.7  --  1.9 1.7 1.8 1.9  --   --   --   PHOS 2.4*  --  2.2* 3.0 2.4*  --   --   --   --    < > = values in this interval not displayed.   GFR: Estimated Creatinine Clearance: 92.5 mL/min (A) (by C-G formula based on SCr of 0.44 mg/dL (L)). Liver Function Tests: Recent Labs  Lab 11/06/20 0337 11/06/20 1424 11/07/20 0124 11/08/20 0237 11/09/20 0132  AST 18 29 45* 50* 27  ALT 22 22 28  40 31  ALKPHOS 303* 252* 230* 258* 234*  BILITOT 1.9* 0.1* 0.8 0.7 0.6  PROT 7.8 6.7 6.2* 6.2* 5.6*  ALBUMIN 2.6* 2.2* 2.1* 2.1* 1.7*   No results for input(s): LIPASE,  AMYLASE in the last 168 hours. Recent Labs  Lab 11/07/20 0851  AMMONIA 19   Coagulation Profile: Recent Labs  Lab 11/05/20 1930 11/06/20 0337  INR 1.5* 1.6*   Cardiac Enzymes: No results for input(s): CKTOTAL, CKMB, CKMBINDEX, TROPONINI in the last 168 hours. BNP (last 3 results) No results for input(s): PROBNP in the last 8760 hours. HbA1C: No results for input(s): HGBA1C in the last 72 hours. CBG: Recent Labs  Lab 11/09/20 2320 11/10/20 0324 11/10/20 0718 11/10/20 1110 11/10/20 1520  GLUCAP 327* 255* 199* 311* 312*   Lipid Profile: No results for input(s): CHOL, HDL, LDLCALC, TRIG, CHOLHDL, LDLDIRECT in the last 72 hours. Thyroid Function Tests: No results for input(s): TSH, T4TOTAL, FREET4, T3FREE, THYROIDAB in the last 72 hours. Anemia Panel: Recent Labs    11/09/20 0132  VITAMINB12 464  FOLATE 15.4  FERRITIN 354*  TIBC 154*  IRON 21*  RETICCTPCT 1.1   Urine analysis:    Component Value Date/Time   COLORURINE YELLOW 11/05/2020 2038   APPEARANCEUR HAZY (A) 11/05/2020 2038   LABSPEC 1.027 11/05/2020 2038   PHURINE 5.0 11/05/2020 2038   GLUCOSEU >=500 (A) 11/05/2020 2038   HGBUR MODERATE (A) 11/05/2020 2038   BILIRUBINUR NEGATIVE 11/05/2020 2038   BILIRUBINUR 1+ 02/23/2016 1256   KETONESUR 80 (A) 11/05/2020 2038   PROTEINUR 30 (A) 11/05/2020 2038   UROBILINOGEN 1.0 02/23/2016 1256   NITRITE NEGATIVE 11/05/2020 2038   LEUKOCYTESUR TRACE (A) 11/05/2020 2038   Sepsis Labs: @LABRCNTIP (procalcitonin:4,lacticidven:4) ) Recent Results (from the past 240 hour(s))  Resp Panel by RT-PCR (Flu A&B, Covid) Nasopharyngeal Swab     Status: None   Collection Time: 11/05/20  7:14 PM   Specimen: Nasopharyngeal Swab; Nasopharyngeal(NP) swabs in vial transport medium  Result Value Ref Range Status   SARS Coronavirus 2 by RT PCR NEGATIVE NEGATIVE Final    Comment: (NOTE) SARS-CoV-2 target nucleic acids are NOT DETECTED.  The SARS-CoV-2 RNA is generally detectable  in upper respiratory specimens during the acute phase of infection. The lowest concentration of SARS-CoV-2 viral copies this assay can detect is 138 copies/mL. A negative result does not preclude SARS-Cov-2 infection and should not be used as the sole basis for treatment or other patient management decisions. A negative result may occur with  improper specimen collection/handling, submission of specimen other than nasopharyngeal swab, presence of viral mutation(s) within the areas targeted by this assay, and inadequate number of viral copies(<138 copies/mL). A negative result must be combined with clinical observations, patient history, and epidemiological information. The expected result is Negative.  Fact Sheet for Patients:  EntrepreneurPulse.com.au  Fact Sheet for Healthcare Providers:  IncredibleEmployment.be  This test is no t yet approved or cleared by the [place] FDA and  has been authorized for detection and/or diagnosis of SARS-CoV-2 by FDA under an Emergency Use Authorization (EUA). This EUA will remain  in effect (meaning this test can be used) for the duration of the COVID-19 declaration under Section 564(b)(1) of the Act, 21 U.S.C.section 360bbb-3(b)(1), unless the authorization is terminated  or revoked sooner.       Influenza A by PCR NEGATIVE NEGATIVE Final   Influenza B by PCR NEGATIVE NEGATIVE Final    Comment: (NOTE) The Xpert Xpress SARS-CoV-2/FLU/RSV plus assay is intended as an aid in the diagnosis of influenza from Nasopharyngeal swab specimens and should not be used as a sole basis for treatment. Nasal washings and aspirates are unacceptable for Xpert Xpress SARS-CoV-2/FLU/RSV testing.  Fact Sheet for Patients: EntrepreneurPulse.com.au  Fact Sheet for Healthcare Providers: IncredibleEmployment.be  This test is not yet approved or cleared by the [place] FDA and has  been authorized for detection and/or diagnosis of SARS-CoV-2 by FDA under an Emergency Use Authorization (EUA). This EUA will remain in effect (meaning this test can be used) for the duration of the COVID-19 declaration under Section 564(b)(1) of the Act, 21 U.S.C. section 360bbb-3(b)(1), unless the authorization is terminated or revoked.  Performed at [facility], [place] [address], [place], [place] [address]   Blood Culture (routine x 2)     Status: None (Preliminary result)   Collection Time: 11/05/20  8:36 PM   Specimen: BLOOD  Result Value Ref Range Status   Specimen Description   Final    BLOOD BLOOD RIGHT FOREARM Performed at [facility]  [address] [place], [place] [address]    Special Requests   Final    BOTTLES DRAWN AEROBIC AND ANAEROBIC Blood Culture results may not be optimal due to an inadequate volume of blood received in culture bottles Performed at [place] [address], [place], [place] [address]    Culture   Final    NO GROWTH 4 DAYS Performed at [facility], [place] [address], [place], [place] [address]    Report Status PENDING  Incomplete  Blood Culture (routine x 2)     Status: None (Preliminary result)   Collection Time: 11/05/20  8:38 PM   Specimen: BLOOD  Result Value Ref Range Status   Specimen Description   Final    BLOOD Performed at [facility] [address], [place], [place] [address]    Special Requests   Final    BOTTLES DRAWN AEROBIC AND ANAEROBIC Blood Culture results may not be optimal due to an inadequate volume of blood received in culture bottles Performed at [place] [address], [place], [place] [address]    Culture   Final    NO GROWTH 4 DAYS Performed at [facility], [place] [address], [place], [place] [address]    Report Status PENDING  Incomplete  Urine culture     Status: Abnormal   Collection Time:  11/05/20  8:38 PM   Specimen: In/Out Cath Urine  Result Value Ref Range Status   Specimen Description   Final    IN/OUT CATH URINE Performed at [place] [address], [place], [place] [address]    Special Requests   Final    NONE Performed at [facility], [place] [address], [place], [place] [address]    Culture 60,000 COLONIES/mL ESCHERICHIA COLI (A)  Final   Report Status 11/08/2020 FINAL  Final   Organism ID, Bacteria ESCHERICHIA COLI (A)  Final      Susceptibility   Escherichia coli - MIC*    AMPICILLIN 4 SENSITIVE Sensitive     CEFAZOLIN <=4 SENSITIVE Sensitive     CEFEPIME <=0.12 SENSITIVE Sensitive     CEFTRIAXONE <=0.25 SENSITIVE Sensitive     CIPROFLOXACIN <=0.25 SENSITIVE Sensitive     GENTAMICIN <=1 SENSITIVE Sensitive     IMIPENEM <=0.25 SENSITIVE Sensitive     NITROFURANTOIN <=16 SENSITIVE Sensitive     TRIMETH/SULFA <=20 SENSITIVE Sensitive     AMPICILLIN/SULBACTAM <=2 SENSITIVE Sensitive     PIP/TAZO <=4 SENSITIVE Sensitive     * 60,000 COLONIES/mL ESCHERICHIA COLI  MRSA PCR Screening     Status: None   Collection Time: 11/06/20  8:42 AM   Specimen: Nasopharyngeal  Result Value Ref Range Status   MRSA by PCR NEGATIVE NEGATIVE Final    Comment:        The GeneXpert MRSA Assay (FDA approved for NASAL specimens only), is one component of a comprehensive MRSA colonization surveillance program. It is not intended to diagnose MRSA infection nor to guide or monitor treatment for MRSA infections. Performed at [facility], [place] [address] [place], [place] [address]   Aerobic/Anaerobic Culture (surgical/deep wound)     Status: None (Preliminary result)   Collection Time: 11/06/20  3:45 PM   Specimen: Liver; Abscess  Result Value Ref Range Status   Specimen Description   Final    LIVER Performed at [facility] [address] [place],  [address]    Special  Requests    Final    NONE Performed at [facility], [place] [address] [place], [place] [address]    Gram Stain   Final    FEW GRAM VARIABLE ROD Performed at [facility], [place] [address], [place], [place] [address]    Culture   Final    ABUNDANT KLEBSIELLA PNEUMONIAE ABUNDANT ESCHERICHIA COLI NO ANAEROBES ISOLATED; CULTURE IN PROGRESS FOR 5 DAYS    Report Status PENDING  Incomplete   Organism ID, Bacteria KLEBSIELLA PNEUMONIAE  Final   Organism ID, Bacteria ESCHERICHIA COLI  Final      Susceptibility   Escherichia coli - MIC*    AMPICILLIN <=2 SENSITIVE Sensitive     CEFAZOLIN <=4 SENSITIVE Sensitive     CEFEPIME <=0.12 SENSITIVE Sensitive     CEFTAZIDIME <=1 SENSITIVE Sensitive     CEFTRIAXONE <=0.25 SENSITIVE Sensitive     CIPROFLOXACIN <=0.25 SENSITIVE Sensitive     GENTAMICIN <=1 SENSITIVE Sensitive     IMIPENEM <=0.25 SENSITIVE Sensitive     TRIMETH/SULFA <=20 SENSITIVE Sensitive     AMPICILLIN/SULBACTAM <=2 SENSITIVE Sensitive     PIP/TAZO <=4 SENSITIVE Sensitive     * ABUNDANT ESCHERICHIA COLI   Klebsiella pneumoniae - MIC*    AMPICILLIN >=32 RESISTANT Resistant     CEFAZOLIN <=4 SENSITIVE Sensitive     CEFEPIME <=0.12 SENSITIVE Sensitive     CEFTAZIDIME <=1 SENSITIVE Sensitive     CEFTRIAXONE <=0.25 SENSITIVE Sensitive     CIPROFLOXACIN <=0.25 SENSITIVE Sensitive     GENTAMICIN <=1 SENSITIVE Sensitive     IMIPENEM <=0.25 SENSITIVE Sensitive     TRIMETH/SULFA <=20 SENSITIVE Sensitive     AMPICILLIN/SULBACTAM 16 INTERMEDIATE Intermediate     PIP/TAZO 16 SENSITIVE Sensitive     * ABUNDANT KLEBSIELLA PNEUMONIAE  Aerobic/Anaerobic Culture w Gram Stain (surgical/deep wound)     Status: None (Preliminary result)   Collection Time: 11/06/20  3:46 PM   Specimen: Liver; Abscess  Result Value Ref Range Status   Specimen Description LIVER ABSCESS  Final   Special Requests MORE INFERIOR RIGHT LIVER  Final   Gram Stain   Final    FEW WBC PRESENT, PREDOMINANTLY  PMN FEW GRAM NEGATIVE RODS RARE GRAM POSITIVE COCCI IN PAIRS Performed at [facility], [place] [address], [place], [place] [address]    Culture   Final    ABUNDANT ESCHERICHIA COLI ABUNDANT STREPTOCOCCUS ANGINOSIS NO ANAEROBES ISOLATED; CULTURE IN PROGRESS FOR 5 DAYS    Report Status PENDING  Incomplete   Organism ID, Bacteria ESCHERICHIA COLI  Final   Organism ID, Bacteria STREPTOCOCCUS ANGINOSIS  Final      Susceptibility   Escherichia coli - MIC*    AMPICILLIN 4 SENSITIVE Sensitive     CEFAZOLIN <=4 SENSITIVE Sensitive     CEFEPIME <=0.12 SENSITIVE Sensitive     CEFTAZIDIME <=1 SENSITIVE Sensitive     CEFTRIAXONE <=0.25 SENSITIVE Sensitive     CIPROFLOXACIN <=0.25 SENSITIVE Sensitive     GENTAMICIN <=1 SENSITIVE Sensitive     IMIPENEM <=0.25 SENSITIVE Sensitive     TRIMETH/SULFA <=20 SENSITIVE Sensitive     AMPICILLIN/SULBACTAM <=2 SENSITIVE Sensitive     PIP/TAZO <=4 SENSITIVE Sensitive     * ABUNDANT ESCHERICHIA COLI   Streptococcus anginosis - MIC*    PENICILLIN <=0.06 SENSITIVE Sensitive     CEFTRIAXONE 0.5 SENSITIVE Sensitive     ERYTHROMYCIN <=0.12 SENSITIVE Sensitive     LEVOFLOXACIN 0.5 SENSITIVE Sensitive  VANCOMYCIN 1 SENSITIVE Sensitive     * ABUNDANT STREPTOCOCCUS ANGINOSIS         Radiology Studies: EEG adult  Result Date: 11/16/2020 Plancher, Baron Sane, MD     16-Nov-2020  4:46 PM PROCEDURE: EEG video routine 23 minutesstudy DATES OF TEST: 2020/11/16 REASON FOR TEST: Seizure AED/Sedative MEDICATIONS: Keppra TECHNIQUE: This is an 18 channel digital EEG recording using the standard international 10/20 system of electrode placement with one channel EKG recording. Pt was awake and drowsy during this recording. FINDINGS: Background rhythm:symmetric 8.5-12 Hz  With intermittent asymmetric left 3-5 Hz Amplitude : normal Breach effect:  NO Variability: NO Reactivity: NO Rhythmic delta activity:NO Periodic discharges: NO Sporadic epileptiform discharges:NO  Electrographic/electroclinical seizures:NO Limited EKG reveals no abnormalities. IMPRESSION: This is an abnormal study due to - 1. L Focal slowing is consistent with a focal disturbance of cerebral function and an underlying structural lesion should be considered. 2. No definite epileptiform discharges or electrographic seizures noted.   Overnight EEG with video  Result Date: 11/09/2020 Lora Havens, MD     11/10/2020  4:09 AM Patient Name: SABRI TEAL MRN: [id number] Epilepsy Attending: Lora Havens Referring Physician/Provider: Dr Roland Rack Duration: 16-Nov-2020 2157 to 11/09/2020 1008 Patient history: 65 year old male with a history of pancreatic adenocarcinoma with recurrence, recent fall and admission for sepsis, now with seizures. EEG to evaluate for seizure Level of alertness: Awake, asleep AEDs during EEG study: LEV Technical aspects: This EEG study was done with scalp electrodes positioned according to the 10-20 International system of electrode placement. Electrical activity was acquired at a sampling rate of 500Hz  and reviewed with a high frequency filter of 70Hz  and a low frequency filter of 1Hz . EEG data were recorded continuously and digitally stored. Description: The posterior dominant rhythm consists of 9 Hz activity of moderate voltage (25-35 uV) seen predominantly in posterior head regions, symmetric and reactive to eye opening and eye closing.  Sleep was characterized by vertex waves, sleep spindles (12 to 14 Hz), maximal frontocentral region.  EEG showed intermittent 3 to 6 Hz theta-delta slowing in left temporal region. Hyperventilation and photic stimulation were not performed.   ABNORMALITY - Intermittent slow, left temporal region IMPRESSION: This study is suggestive of cortical dysfunction arising from left temporal region, nonspecific etiology but likely secondary to underlying subdural Hematoma. No seizures or definite epileptiform discharges were seen throughout the  recording. Priyanka Barbra Sarks      Scheduled Meds: . Chlorhexidine Gluconate Cloth  6 each Topical Daily  . insulin aspart  0-9 Units Subcutaneous Q4H  . insulin detemir  10 Units Subcutaneous Daily  . ipratropium  0.5 mg Nebulization BID  . levalbuterol  0.63 mg Nebulization BID  . lipase/protease/amylase  12,000 Units Oral TID with meals  . loratadine  10 mg Oral Daily  . mouth rinse  15 mL Mouth Rinse BID  . metroNIDAZOLE  500 mg Oral Q8H  . pantoprazole (PROTONIX) IV  40 mg Intravenous Q24H  . sodium chloride flush  5 mL Intracatheter Q8H  . tamsulosin  0.4 mg Oral Daily   Continuous Infusions: . sodium chloride 10 mL/hr at 11/09/20 1201  . cefTRIAXone (ROCEPHIN)  IV Stopped (11/10/20 1014)  . levETIRAcetam Stopped (11/10/20 0803)     LOS: 4 days      Debbe Odea, MD [facility] Pager: [email or web address] 11/10/2020, 4:34 PM

## 2020-11-10 NOTE — Evaluation (Signed)
Physical Therapy Evaluation Patient Details Name: Colton Bush MRN: [id number] DOB: Sep 02, 1955 Today's Date: 11/10/2020   History of Present Illness  65 y.o. male who presented to [facility] as a transfer from [place], in which the pt presented to the ED 4/8 with AMS. Pt was found to be septic and initial source was thought to be another UTI but then further work-up revealed that he had a liver abscess(severe sepsis)  and pneumonia with bacteremia. S/p 2 drains placed in his R liver. EEG revealed L focal slowing and cortical dysfunction arising from L temporal region. MRI of head revealed moderate sized subdural hematoma. S/p L burr hole for evacuation of subdural hematoma 4/12. PMH pacreatic CA dx 2017, DM2 HTN HLD CHF 3/21-31/22 R hip IM repair after fall    Clinical Impression  Patient presents with condition above and deficits mentioned below, see PT Problem List. Pt with expressive language deficits and requires increased time to retrieve information when questioned. Pt was mod I with RW prior to admission 3/21 with fall and d/c SNF at that time. Pt declines further SNF and reports he wants to go home. Pt currently requiring extensive assistance of +2 for all bed mobility, transfers, and several side steps with bil UE support. He demonstrates a bias to lean to the R. Recommendation for CIR for decreased burden of care and possible w/c level d/c 1 person (A) transfer with family.  Patient will benefit from skilled PT acutely to increase independence and safety with functional mobility to allow discharge to CIR.    Follow Up Recommendations CIR    Equipment Recommendations  Rolling walker with 5" wheels;3in1 (PT);Wheelchair (measurements PT);Wheelchair cushion (measurements PT)    Recommendations for Other Services Rehab consult     Precautions / Restrictions Precautions Precautions: Fall Precaution Comments: 2 JP drains R side; A-line Restrictions Weight Bearing Restrictions:  Yes RLE Weight Bearing: Weight bearing as tolerated Other Position/Activity Restrictions: WBAT      Mobility  Bed Mobility Overal bed mobility: Needs Assistance Bed Mobility: Supine to Sit;Rolling Rolling: Total assist;Mod assist   Supine to sit: +2 for physical assistance;Mod assist;HOB elevated (25 degrees)     General bed mobility comments: pt requires (A) to faciliate rolling and to elevate from bed suface pt unable to flex R Knee. pt with bil legs lifted off bed to allow pt to come into sitting. pt throughout session with c/o of knee pain    Transfers Overall transfer level: Needs assistance Equipment used: 2 person hand held assist Transfers: Sit to/from [facility] Sit to Stand: +2 physical assistance;Mod assist;From elevated surface Stand pivot transfers: +2 physical assistance;Max assist;From elevated surface       General transfer comment: ModAx2 to power up to stand from elevated EOB and maxAx2 to come to stand from lower surfaces. Intermittent R knee block due to buckling, requiring extra time and maxAx2 to stand step to L EOB > chair. Elevated seat surface in drop arm chair to allow easier transfer options for RN staff. pt sit<>Stand from bed x2 and from chair x1 to ensure he can go back to bed surface after sititng up.  Ambulation/Gait Ambulation/Gait assistance: Max assist;+2 physical assistance Gait Distance (Feet): 3 Feet Assistive device: 2 person hand held assist Gait Pattern/deviations: Decreased step length - right;Decreased step length - left;Decreased stride length;Decreased weight shift to right;Decreased weight shift to left;Shuffle;Trunk flexed Gait velocity: reduced Gait velocity interpretation: <1.31 ft/sec, indicative of household ambulator General Gait Details: Pt with  bil knee and hip flexion, needing repeated cues to extend with intermittent R knee block due to buckling. Several side steps to L with bil HHA and maxAx2, cuing to shift  weight and advance feet.  Stairs            Wheelchair Mobility    Modified Rankin (Stroke Patients Only) Modified Rankin (Stroke Patients Only) Pre-Morbid Rankin Score: Moderately severe disability Modified Rankin: Moderately severe disability     Balance Overall balance assessment: Needs assistance Sitting-balance support: Bilateral upper extremity supported;Feet supported Sitting balance-[name]: Fair     Standing balance support: Bilateral upper extremity supported;During functional activity Standing balance-[name]: Poor Standing balance comment: R lateral lean and bias. pt attempts to correct unable to sustain                             Pertinent Vitals/Pain Pain Assessment: Faces Faces Pain Scale: Hurts whole lot Pain Location: R knee pain Pain Descriptors / Indicators: Grimacing;Tender Pain Intervention(s): Monitored during session;Repositioned;Ice applied (to bil knees, per request by pt)    Home Living Family/patient expects to be discharged to:: Private residence Living Arrangements: Spouse/significant other (reports wife) Available Help at Discharge: Family;Available 24 hours/day Type of Home: House Home Access: Stairs to enter Entrance Stairs-Rails: None Entrance Stairs-Number of Steps: (side of house) 1 step then laundry room then 2 steps to main level Home Layout: One level Home Equipment: None Additional Comments: reports bad experience at SNF and refusing to consider transfer to SNF again. pt states It will take me longer but im going home.pt state "ive done more here already then i did the whole time there. no one got me up"    Prior Function Level of Independence: Needs assistance   Gait / Transfers Assistance Needed: limited mobility with RW  ADL's / Homemaking Assistance Needed: needs assistance  Comments: reports could use a WC in the home     Hand Dominance   Dominant Hand: Right    Extremity/Trunk Assessment    Upper Extremity Assessment Upper Extremity Assessment: Defer to OT evaluation    Lower Extremity Assessment Lower Extremity Assessment: Generalized weakness;RLE deficits/detail RLE Deficits / Details: requires blocking when weight bearing due to intermittent knee buckling, indicating quads weakness, and reports pain with any movement    Cervical / Trunk Assessment Cervical / Trunk Assessment: Kyphotic  Communication   Communication: Expressive difficulties  Cognition Arousal/Alertness: Awake/alert Behavior During Therapy: Flat affect Overall Cognitive Status: Impaired/Different from baseline                                 General Comments: pt does not recognize doctor when asked. Dr Dawley reports only meeting him twice but pt unaware of doctor present performed last procedure. Pt states "ives seen so many doctors"      General Comments General comments (skin integrity, edema, etc.): drain clamped to gown to keep them safe with transfers. Gait belt above drain sites.    Exercises     Assessment/Plan    PT Assessment Patient needs continued PT services  PT Problem List Decreased strength;Decreased range of motion;Decreased activity tolerance;Decreased balance;Decreased coordination;Decreased mobility;Decreased cognition;Decreased knowledge of use of DME;Decreased safety awareness       PT Treatment Interventions DME instruction;Gait training;Stair training;Functional mobility training;Therapeutic activities;Balance training;Therapeutic exercise;Neuromuscular re-education;Cognitive remediation;Patient/family education;Wheelchair mobility training    PT Goals (Current goals can be found  in the Care Plan section)  Acute Rehab PT Goals Patient Stated Goal: to go home PT Goal Formulation: With patient Time For Goal Achievement: 11/24/20 Potential to Achieve Goals: Fair    Frequency Min 4X/week   Barriers to discharge        Co-evaluation PT/OT/SLP  Co-Evaluation/Treatment: Yes Reason for Co-Treatment: Complexity of the patient's impairments (multi-system involvement);Necessary to address cognition/behavior during functional activity;For patient/therapist safety;To address functional/ADL transfers PT goals addressed during session: Mobility/safety with mobility;Balance         AM-PAC PT "6 Clicks" Mobility  Outcome Measure Help needed turning from your back to your side while in a flat bed without using bedrails?: Total Help needed moving from lying on your back to sitting on the side of a flat bed without using bedrails?: Total Help needed moving to and from a bed to a chair (including a wheelchair)?: Total Help needed standing up from a chair using your arms (e.g., wheelchair or bedside chair)?: Total Help needed to walk in hospital room?: Total Help needed climbing 3-5 steps with a railing? : Total 6 Click Score: 6    End of Session Equipment Utilized During Treatment: Gait belt Activity Tolerance: Patient tolerated treatment well Patient left: in chair;with call bell/phone within reach;with chair alarm set Nurse Communication: Mobility status PT Visit Diagnosis: Unsteadiness on feet (R26.81);Other abnormalities of gait and mobility (R26.89);Muscle weakness (generalized) (M62.81);Difficulty in walking, not elsewhere classified (R26.2)    Time: 3614-4315 PT Time Calculation (min) (ACUTE ONLY): 22 min   Charges:   PT Evaluation $PT Eval Moderate Complexity: 1 Mod          Moishe Spice, PT, DPT Acute Rehabilitation Services  Pager: [phone] Office: [phone]   Orvan Falconer 11/10/2020, 1:12 PM

## 2020-11-11 ENCOUNTER — Ambulatory Visit: Payer: Medicare Other | Admitting: Radiation Oncology

## 2020-11-11 ENCOUNTER — Telehealth: Payer: Self-pay | Admitting: Radiation Oncology

## 2020-11-11 ENCOUNTER — Telehealth: Payer: Self-pay | Admitting: Hematology

## 2020-11-11 DIAGNOSIS — A419 Sepsis, unspecified organism: Secondary | ICD-10-CM | POA: Diagnosis not present

## 2020-11-11 DIAGNOSIS — N401 Enlarged prostate with lower urinary tract symptoms: Secondary | ICD-10-CM | POA: Diagnosis not present

## 2020-11-11 DIAGNOSIS — C250 Malignant neoplasm of head of pancreas: Secondary | ICD-10-CM | POA: Diagnosis not present

## 2020-11-11 DIAGNOSIS — K750 Abscess of liver: Secondary | ICD-10-CM | POA: Diagnosis not present

## 2020-11-11 LAB — CULTURE, BLOOD (ROUTINE X 2)
Culture: NO GROWTH
Culture: NO GROWTH

## 2020-11-11 LAB — AEROBIC/ANAEROBIC CULTURE W GRAM STAIN (SURGICAL/DEEP WOUND)

## 2020-11-11 LAB — GLUCOSE, CAPILLARY
Glucose-Capillary: 113 mg/dL — ABNORMAL HIGH (ref 70–99)
Glucose-Capillary: 228 mg/dL — ABNORMAL HIGH (ref 70–99)
Glucose-Capillary: 214 mg/dL — ABNORMAL HIGH (ref 70–99)
Glucose-Capillary: 195 mg/dL — ABNORMAL HIGH (ref 70–99)

## 2020-11-11 MED ORDER — LEVETIRACETAM 500 MG PO TABS
1000.0000 mg | ORAL_TABLET | Freq: Two times a day (BID) | ORAL | Status: DC
  Administered 2020-11-11 – 2020-11-12 (×2): 1000 mg via ORAL
  Filled 2020-11-11 (×2): qty 2

## 2020-11-11 MED ORDER — ENOXAPARIN SODIUM 40 MG/0.4ML ~~LOC~~ SOLN
40.0000 mg | Freq: Every day | SUBCUTANEOUS | Status: DC
  Administered 2020-11-11 – 2020-11-12 (×2): 40 mg via SUBCUTANEOUS
  Filled 2020-11-11 (×2): qty 0.4

## 2020-11-11 MED ORDER — PANTOPRAZOLE SODIUM 40 MG PO TBEC
40.0000 mg | DELAYED_RELEASE_TABLET | Freq: Every day | ORAL | Status: DC
  Administered 2020-11-11 – 2020-11-12 (×2): 40 mg via ORAL
  Filled 2020-11-11 (×2): qty 1

## 2020-11-11 NOTE — Progress Notes (Signed)
Neurosurgery Service Progress Note  Subjective: No acute events overnight, no new complaints this morning, speech improving  Objective: Vitals:   11/11/20 1000 11/11/20 1100 11/11/20 1200 11/11/20 1300  BP: 127/62 (!) 122/59 117/67 125/65  Pulse: 87 71 92 83  Resp: (!) 21 19    Temp:   98.3 F (36.8 C)   TempSrc:   Oral   SpO2: 91% 92% 92% 94%  Weight:      Height:        Physical Exam: Awake/alert, Ox3, FCx4, speech with decreased overall output but good comprehension, incision c/d/i  Assessment & Plan: 65 y.o. man s/p burr hole evacuation of subdural empyema, intra-op findings c/w subdural hematoma and not empyema.   -okay to transfer out of the ICU to regular floor bed from my standpoint -okay for DVT chemoPPx today, hold full anticoagulation until [place]  11/11/20 2:11 PM

## 2020-11-11 NOTE — Progress Notes (Signed)
Physical Therapy Treatment Patient Details Name: Colton Bush MRN: [id number] DOB: [birth year] Today's Date: 11/11/2020    History of Present Illness 65 y.o. male who presented to [facility] as a transfer from [place], in which the pt presented to the ED 4/8 with AMS. Pt was found to be septic and initial source was thought to be another UTI but then further work-up revealed that he had a liver abscess(severe sepsis)  and pneumonia with bacteremia. S/p 2 drains placed in his R liver. EEG revealed L focal slowing and cortical dysfunction arising from L temporal region. MRI of head revealed moderate sized subdural hematoma. S/p L burr hole for evacuation of subdural hematoma 4/12. PMH pacreatic CA dx 2017, DM2 HTN HLD CHF 3/21-31/22 R hip IM repair after fall    PT Comments    Pt with poor awareness of his body or deficits, stating that he felt like he needed to have a bowel movement but could not yet he had already had a bowel movement in bed. Performed pericare with x3 standing bouts from EOB > RW. Pt progressing to only needing modAx2 to take small side steps to L with RW to transfer to the recliner. Pt continues to be limited in mobility progression by bil knee pain (R>L), thus performed warm-up prior to standing and applied ice at end of session. Will continue to follow acutely. Current recommendations remain appropriate.    Follow Up Recommendations  CIR     Equipment Recommendations  Rolling walker with 5" wheels;3in1 (PT);Wheelchair (measurements PT);Wheelchair cushion (measurements PT)    Recommendations for Other Services       Precautions / Restrictions Precautions Precautions: Fall Precaution Comments: 2 JP drains R side Restrictions Weight Bearing Restrictions: Yes RLE Weight Bearing: Weight bearing as tolerated Other Position/Activity Restrictions: WBAT    Mobility  Bed Mobility Overal bed mobility: Needs Assistance Bed Mobility: Supine to Sit     Supine to sit:  +2 for physical assistance;Mod assist;HOB elevated     General bed mobility comments: Pt needing extra time and repeated cues to walk feet off L EOB. ModAx2 to complete leg transition and ascension of trunk using bed rails and HOB elevated.    Transfers Overall transfer level: Needs assistance Equipment used: Rolling walker (2 wheeled) Transfers: Sit to/from [facility] Sit to Stand: +2 physical assistance;Mod assist;From elevated surface;+2 safety/equipment Stand pivot transfers: +2 physical assistance;From elevated surface;Mod assist;+2 safety/equipment       General transfer comment: ModAx2 to power up to stand from elevated EOB 3x, cuing pt for proper hand placement of at least 1 hand on bed to push up to RW, poor carryover. ModAx2 to manage RW and direct pt to L to stand step transfer to recliner.  Ambulation/Gait Ambulation/Gait assistance: +2 physical assistance;Mod assist;+2 safety/equipment Gait Distance (Feet): 3 Feet Assistive device: Rolling walker (2 wheeled) Gait Pattern/deviations: Decreased step length - right;Decreased step length - left;Decreased stride length;Decreased weight shift to right;Decreased weight shift to left;Shuffle;Trunk flexed Gait velocity: reduced Gait velocity interpretation: <1.31 ft/sec, indicative of household ambulator General Gait Details: Pt with bil knee and hip flexion, needing repeated cues to extend with close R knee guarding to ensure no buckling. Several side steps to L with RW and modAx2, cuing to shift weight and advance feet.   Stairs             Wheelchair Mobility    Modified Rankin (Stroke Patients Only) Modified Rankin (Stroke Patients Only) Pre-Morbid Rankin Score: Moderately  severe disability Modified Rankin: Moderately severe disability     Balance Overall balance assessment: Needs assistance Sitting-balance support: Bilateral upper extremity supported;Feet supported Sitting balance-[name]:  Poor Sitting balance - Comments: Pt with poor awareness of midline, needing repeated cues to tilt his head to L and lean more to L to find and maintain midline, min guard.   Standing balance support: Bilateral upper extremity supported;During functional activity Standing balance-[name]: Poor Standing balance comment: R lateral lean and bias. Pt reliant on bil UE support and external assist.                            Cognition Arousal/Alertness: Awake/alert Behavior During Therapy: Flat affect Overall Cognitive Status: Impaired/Different from baseline Area of Impairment: Awareness;Safety/judgement;Problem solving                         Safety/Judgement: Decreased awareness of safety;Decreased awareness of deficits Awareness: Emergent Problem Solving: Slow processing;Difficulty sequencing;Requires verbal cues;Requires tactile cues General Comments: Pt unaware that he had already had a bowel movement in bed, stating he felt like he needed to go but had not yet.      Exercises General Exercises - Lower Extremity Quad Sets: Strengthening;Both;10 reps;Supine Long Arc Quad: Strengthening;Both;5 reps;Seated Heel Slides: Strengthening;Both;10 reps;Supine;AAROM (AAROM on R)    General Comments        Pertinent Vitals/Pain Pain Assessment: Faces Faces Pain Scale: Hurts whole lot Pain Location: knees, R>L Pain Descriptors / Indicators: Grimacing;Tender;Guarding;Moaning Pain Intervention(s): Limited activity within patient's tolerance;Monitored during session;Repositioned;Ice applied (to bil knees, per pt request)    Home Living                      Prior Function            PT Goals (current goals can now be found in the care plan section) Acute Rehab PT Goals Patient Stated Goal: to go home PT Goal Formulation: With patient Time For Goal Achievement: 11/24/20 Potential to Achieve Goals: Fair Progress towards PT goals: Progressing toward  goals    Frequency    Min 4X/week      PT Plan Current plan remains appropriate    Co-evaluation              AM-PAC PT "6 Clicks" Mobility   Outcome Measure  Help needed turning from your back to your side while in a flat bed without using bedrails?: A Lot Help needed moving from lying on your back to sitting on the side of a flat bed without using bedrails?: A Lot Help needed moving to and from a bed to a chair (including a wheelchair)?: Total Help needed standing up from a chair using your arms (e.g., wheelchair or bedside chair)?: A Lot Help needed to walk in hospital room?: Total Help needed climbing 3-5 steps with a railing? : Total 6 Click Score: 9    End of Session Equipment Utilized During Treatment: Gait belt Activity Tolerance: Patient tolerated treatment well Patient left: in chair;with call bell/phone within reach;with chair alarm set Nurse Communication: Mobility status (lack of bowel movement awareness) PT Visit Diagnosis: Unsteadiness on feet (R26.81);Other abnormalities of gait and mobility (R26.89);Muscle weakness (generalized) (M62.81);Difficulty in walking, not elsewhere classified (R26.2)     Time: 7741-2878 PT Time Calculation (min) (ACUTE ONLY): 32 min  Charges:  $Therapeutic Exercise: 8-22 mins $Therapeutic Activity: 8-22 mins  Moishe Spice, PT, DPT Acute Rehabilitation Services  Pager: [phone] Office: [place] 11/11/2020, 5:40 PM

## 2020-11-11 NOTE — Progress Notes (Signed)
PROGRESS NOTE    Colton Bush   IFO:[id number]  DOB: 06-20-1956  DOA: 11/05/2020 PCP: Wenda Low, MD   Brief Narrative:  Colton Bush is a 65 year old male with recurrent pancreatic cancer, diabetes mellitus type 2, chronic diastolic heart failure (grade 1), recent right hip fracture status post IM nail, hypertension and hyperlipidemia. The patient presented from [facility] on 4/8 for confusion and was found to have DKA and sepsis.  He was found to have strep angina gnosis bacteremia and a liver abscess along with bilateral lower lobe pneumonia.  On 4/10 an MRI of the brain was obtained due to ongoing confusion and he was found to have an age-indeterminate mixed left subarachnoid and subdural hematoma measuring 10 mm in thickness.  A head CT was recommended for better characterization.  Also noted were areas of suspected acute ischemia in the left temporal and parietal lobes. That same night, he was subsequently noted to have 2 seizure-like episodes and neurology was consulted.  He was loaded with Keppra which resulted in cessation of the seizure-like activity.  He was transferred to [facility] for a continuous EEG.  On 4/12 he was evaluated by neurosurgery and there was concern that he may have a subdural empyema rather than a hematoma.  Chelation in the afternoon and appears that mostly chronic blood with some membranes was found.  There was no evidence of purulent material.  Subjective: No complaints.     Assessment & Plan:   Principal Problem:   Liver abscess status post JP drain x2   Strep anginosis  bacteremia  Bilateral lower lobe pulmonary infiltrates Subdural hematoma -ID recommends ceftriaxone (in case he has an infected subdural hematoma) and oral metronidazole for total of 6 weeks -ID plans to follow-up prior to stopping the antibiotics and obtain reimaging of his liver to make a decision on whether to stop antibiotics    Active Problems: Acute toxic  encephalopathy -Secondary to infection, possible seizures and subdural hematoma/empyema - speaking better today  Subdural hematoma -Question if this is infected-status post bur holes on 4/12-neurosurgery following- recommending transfer out of ICU and resumption of DVT prophylaxis  Probable seizures -Continue Keppra per neurology -EEG 4/12: Suggestive of cortical dysfunction arising from the temporal region-no seizure or definite epileptiform discharges  Uncontrolled diabetes mellitus with hyperglycemia-presented with DKA -Hemoglobin A1c on 3/21 was 10.8 -Was noted to be hypoglycemic with CBG of 68 on 4/12 (was n.p.o.) -CBGs have been in the 300s since yesterday -He was taking 10 units of Levemir in the morning and 15 in the evening at home with NovoLog sliding scale with meals -/12> increased Lantus to 10 units twice daily and Changed sliding scale to 3 times daily with meals moderate dose - continue current doses    Essential hypertension -Amlodipine & benazepril on hold-BP is not elevated    GERD without esophagitis -Continue PPI    Mixed diabetic hyperlipidemia associated with type 2 diabetes mellitus  -Continue statin    Adenocarcinoma of head of pancreas in 2/22  Atrophic pancreas -Status post Whipple's procedure chemo and radiation -His current recurrence is nonresectable radiation was planned but has not been able to be started due to his hip fracture and his current issue - CT shows that his remaining pancreas is atrophic -He is on Creon replacement with meals  BPH with urinary obstruction -Continue Flomax    Recent hip fracture -Status post IM nail -Continue PT OT  -Continue wound care recommendations for below injuries Pressure Injury  11/07/20 Heel Right Stage 1 -  Intact skin with non-blanchable redness of a localized area usually over a bony prominence. (Active)  11/07/20 0800  Location: Heel  Location Orientation: Right  Staging: Stage 1 -  Intact skin  with non-blanchable redness of a localized area usually over a bony prominence.  Wound Description (Comments):   Present on Admission:      Pressure Injury 11/07/20 Sacrum Stage 1 -  Intact skin with non-blanchable redness of a localized area usually over a bony prominence. (Active)  11/07/20 2000  Location: Sacrum  Location Orientation:   Staging: Stage 1 -  Intact skin with non-blanchable redness of a localized area usually over a bony prominence.  Wound Description (Comments):   Present on Admission:      Pressure Injury 11/07/20 Heel Right Deep Tissue Pressure Injury - Purple or maroon localized area of discolored intact skin or blood-filled blister due to damage of underlying soft tissue from pressure and/or shear. (Active)  11/07/20 2000  Location: Heel  Location Orientation: Right  Staging: Deep Tissue Pressure Injury - Purple or maroon localized area of discolored intact skin or blood-filled blister due to damage of underlying soft tissue from pressure and/or shear.  Wound Description (Comments):   Present on Admission:      Pressure Injury 11/07/20 Sacrum Deep Tissue Pressure Injury - Purple or maroon localized area of discolored intact skin or blood-filled blister due to damage of underlying soft tissue from pressure and/or shear. (Active)  11/07/20 2000  Location: Sacrum  Location Orientation:   Staging: Deep Tissue Pressure Injury - Purple or maroon localized area of discolored intact skin or blood-filled blister due to damage of underlying soft tissue from pressure and/or shear.  Wound Description (Comments):   Present on Admission:            Time spent in minutes: 30 DVT prophylaxis: Place and maintain sequential compression device Start: 11/09/20 1051  Code Status: Full code Family Communication:  Level of Care: Level of care: ICU Disposition Plan:  Status is: Inpatient  Remains inpatient appropriate because:IV treatments appropriate due to intensity of  illness or inability to take PO   Dispo: The patient is from: SNF              Anticipated d/c is to: CIR recommended by PT              Patient currently is not medically stable to d/c.   Difficult to place patient No      Consultants:   ID  Neuro  NS Procedures:   Burr holes  Abdominal drains Antimicrobials:  Anti-infectives (From admission, onward)   Start     Dose/Rate Route Frequency Ordered Stop   11/09/20 1000  cefTRIAXone (ROCEPHIN) 2 g in sodium chloride 0.9 % 100 mL IVPB        2 g 200 mL/hr over 30 Minutes Intravenous Every 12 hours 11/09/20 0828     11/09/20 0915  metroNIDAZOLE (FLAGYL) tablet 500 mg        500 mg Oral Every 8 hours 11/09/20 0828     11/07/20 2200  Ampicillin-Sulbactam (UNASYN) 3 g in sodium chloride 0.9 % 100 mL IVPB  Status:  Discontinued        3 g 200 mL/hr over 30 Minutes Intravenous Every 6 hours 11/06/20 2135 11/07/20 0943   11/07/20 1800  Ampicillin-Sulbactam (UNASYN) 3 g in sodium chloride 0.9 % 100 mL IVPB  Status:  Discontinued  3 g 200 mL/hr over 30 Minutes Intravenous Every 6 hours 11/07/20 1219 11/09/20 0828   11/07/20 1600  Ampicillin-Sulbactam (UNASYN) 3 g in sodium chloride 0.9 % 100 mL IVPB  Status:  Discontinued        3 g 200 mL/hr over 30 Minutes Intravenous Every 6 hours 11/07/20 0943 11/07/20 1219   11/07/20 1030  Ampicillin-Sulbactam (UNASYN) 3 g in sodium chloride 0.9 % 100 mL IVPB        3 g 200 mL/hr over 30 Minutes Intravenous  Once 11/07/20 0943 11/07/20 1213   11/06/20 1800  Ampicillin-Sulbactam (UNASYN) 3 g in sodium chloride 0.9 % 100 mL IVPB  Status:  Discontinued        3 g 200 mL/hr over 30 Minutes Intravenous Every 6 hours 11/06/20 1442 11/06/20 2135   11/06/20 1100  cefTRIAXone (ROCEPHIN) 2 g in sodium chloride 0.9 % 100 mL IVPB  Status:  Discontinued        2 g 200 mL/hr over 30 Minutes Intravenous Every 24 hours 11/06/20 0855 11/06/20 1435   11/06/20 0800  vancomycin (VANCOREADY) IVPB 1250  mg/250 mL  Status:  Discontinued        1,250 mg 166.7 mL/hr over 90 Minutes Intravenous Every 12 hours 11/06/20 0643 11/06/20 0854   11/06/20 0700  metroNIDAZOLE (FLAGYL) IVPB 500 mg  Status:  Discontinued        500 mg 100 mL/hr over 60 Minutes Intravenous Every 8 hours 11/06/20 0638 11/06/20 1452   11/06/20 0400  ceFEPIme (MAXIPIME) 2 g in sodium chloride 0.9 % 100 mL IVPB  Status:  Discontinued        2 g 200 mL/hr over 30 Minutes Intravenous Every 8 hours 11/05/20 2358 11/06/20 0853   11/05/20 1845  vancomycin (VANCOREADY) IVPB 1500 mg/300 mL        1,500 mg 150 mL/hr over 120 Minutes Intravenous  Once 11/05/20 1830 11/05/20 2237   11/05/20 1830  ceFEPIme (MAXIPIME) 2 g in sodium chloride 0.9 % 100 mL IVPB        2 g 200 mL/hr over 30 Minutes Intravenous  Once 11/05/20 1824 11/05/20 2103   11/05/20 1830  metroNIDAZOLE (FLAGYL) IVPB 500 mg        500 mg 100 mL/hr over 60 Minutes Intravenous  Once 11/05/20 1824 11/05/20 2140   11/05/20 1830  vancomycin (VANCOCIN) IVPB 1000 mg/200 mL premix  Status:  Discontinued        1,000 mg 200 mL/hr over 60 Minutes Intravenous  Once 11/05/20 1824 11/05/20 1830       Objective: Vitals:   11/11/20 1100 11/11/20 1200 11/11/20 1300 11/11/20 1400  BP: (!) 122/59 117/67 125/65 123/65  Pulse: 71 92 83 78  Resp: 19     Temp:  98.3 F (36.8 C)    TempSrc:  Oral    SpO2: 92% 92% 94% 96%  Weight:      Height:        Intake/Output Summary (Last 24 hours) at 11/11/2020 1429 Last data filed at 11/11/2020 1400 Gross per 24 hour  Intake 812.85 ml  Output 2325 ml  Net -1512.15 ml   Filed Weights   11/06/20 0800 11/06/20 0903 11/08/20 2103  Weight: 79 kg 79 kg 86.3 kg    Examination: General exam: Appears comfortable  HEENT: PERRLA, oral mucosa moist, no sclera icterus or thrush Respiratory system: Clear to auscultation. Respiratory effort normal. Cardiovascular system: S1 & S2 heard, regular rate and rhythm Gastrointestinal system:  Abdomen soft, non-tender, nondistended. Normal bowel sounds  - drains noted. Central nervous system: Alert   No focal neurological deficits. Extremities: No cyanosis, clubbing or edema Skin: No rashes or ulcers Psychiatry:  flat affect  Data Reviewed: I have personally reviewed following labs and imaging studies  CBC: Recent Labs  Lab 11/05/20 1930 11/05/20 2007 11/06/20 0337 11/07/20 0124 11/08/20 0237 11/09/20 0132 11/09/20 1251 11/10/20 0705 11/10/20 0829  WBC 21.3*  --  31.0* 22.6* 11.7* 10.3  --  9.6 11.0*  NEUTROABS 20.1*  --  29.4* 20.9* 9.9* 8.5*  --   --   --   HGB 12.5*   < > 11.0* 10.3* 9.9* 9.6* 10.2* 10.0* 10.9*  HCT 39.1   < > 35.6* 31.7* 31.1* 29.4* 30.0* 30.6* 33.5*  MCV 94.7  --  95.4 93.0 93.4 91.6  --  91.9 90.8  PLT 529*  --  635* 470* 410* 386  --  415* 469*   < > = values in this interval not displayed.   Basic Metabolic Panel: Recent Labs  Lab 11/06/20 1424 11/06/20 1820 11/07/20 0124 11/08/20 0237 11/09/20 0132 11/09/20 0801 11/09/20 1251 11/10/20 0705 11/10/20 0829  NA 139   < > 137 136 136 137 140 136 135  K 3.0*   < > 3.2* 2.7* 3.2* 3.0* 3.0* 3.3* 3.6  CL 110   < > 107 104 103 101  --  102 101  CO2 21*   < > 21* 24 27 28   --  30 26  GLUCOSE 139*   < > 169* 79 140* 179*  --  209* 210*  BUN 15   < > 14 10 5* <5*  --  6* 7*  CREATININE 0.57*   < > 0.43* 0.45* 0.44* 0.42*  --  0.42* 0.44*  CALCIUM 8.3*   < > 8.2* 7.8* 7.5* 7.7*  --  8.0* 8.0*  MG 1.7  --  1.9 1.7 1.8 1.9  --   --   --   PHOS 2.4*  --  2.2* 3.0 2.4*  --   --   --   --    < > = values in this interval not displayed.   GFR: Estimated Creatinine Clearance: 92.5 mL/min (A) (by C-G formula based on SCr of 0.44 mg/dL (L)). Liver Function Tests: Recent Labs  Lab 11/06/20 0337 11/06/20 1424 11/07/20 0124 11/08/20 0237 11/09/20 0132  AST 18 29 45* 50* 27  ALT 22 22 28  40 31  ALKPHOS 303* 252* 230* 258* 234*  BILITOT 1.9* 0.1* 0.8 0.7 0.6  PROT 7.8 6.7 6.2* 6.2* 5.6*   ALBUMIN 2.6* 2.2* 2.1* 2.1* 1.7*   No results for input(s): LIPASE, AMYLASE in the last 168 hours. Recent Labs  Lab 11/07/20 0851  AMMONIA 19   Coagulation Profile: Recent Labs  Lab 11/05/20 1930 11/06/20 0337  INR 1.5* 1.6*   Cardiac Enzymes: No results for input(s): CKTOTAL, CKMB, CKMBINDEX, TROPONINI in the last 168 hours. BNP (last 3 results) No results for input(s): PROBNP in the last 8760 hours. HbA1C: No results for input(s): HGBA1C in the last 72 hours. CBG: Recent Labs  Lab 11/10/20 1520 11/10/20 1923 11/10/20 2126 11/11/20 0722 11/11/20 1114  GLUCAP 312* 257* 271* 113* 228*   Lipid Profile: No results for input(s): CHOL, HDL, LDLCALC, TRIG, CHOLHDL, LDLDIRECT in the last 72 hours. Thyroid Function Tests: No results for input(s): TSH, T4TOTAL, FREET4, T3FREE, THYROIDAB in the last 72 hours. Anemia Panel: Recent Labs  11/09/20 0132  VITAMINB12 464  FOLATE 15.4  FERRITIN 354*  TIBC 154*  IRON 21*  RETICCTPCT 1.1   Urine analysis:    Component Value Date/Time   COLORURINE YELLOW 11/05/2020 2038   APPEARANCEUR HAZY (A) 11/05/2020 2038   LABSPEC 1.027 11/05/2020 2038   PHURINE 5.0 11/05/2020 2038   GLUCOSEU >=500 (A) 11/05/2020 2038   HGBUR MODERATE (A) 11/05/2020 2038   BILIRUBINUR NEGATIVE 11/05/2020 2038   BILIRUBINUR 1+ 02/23/2016 1256   KETONESUR 80 (A) 11/05/2020 2038   PROTEINUR 30 (A) 11/05/2020 2038   UROBILINOGEN 1.0 02/23/2016 1256   NITRITE NEGATIVE 11/05/2020 2038   LEUKOCYTESUR TRACE (A) 11/05/2020 2038   Sepsis Labs: @LABRCNTIP (procalcitonin:4,lacticidven:4) ) Recent Results (from the past 240 hour(s))  Resp Panel by RT-PCR (Flu A&B, Covid) Nasopharyngeal Swab     Status: None   Collection Time: 11/05/20  7:14 PM   Specimen: Nasopharyngeal Swab; Nasopharyngeal(NP) swabs in vial transport medium  Result Value Ref Range Status   SARS Coronavirus 2 by RT PCR NEGATIVE NEGATIVE Final    Comment: (NOTE) SARS-CoV-2 target  nucleic acids are NOT DETECTED.  The SARS-CoV-2 RNA is generally detectable in upper respiratory specimens during the acute phase of infection. The lowest concentration of SARS-CoV-2 viral copies this assay can detect is 138 copies/mL. A negative result does not preclude SARS-Cov-2 infection and should not be used as the sole basis for treatment or other patient management decisions. A negative result may occur with  improper specimen collection/handling, submission of specimen other than nasopharyngeal swab, presence of viral mutation(s) within the areas targeted by this assay, and inadequate number of viral copies(<138 copies/mL). A negative result must be combined with clinical observations, patient history, and epidemiological information. The expected result is Negative.  Fact Sheet for Patients:  EntrepreneurPulse.com.au  Fact Sheet for Healthcare Providers:  IncredibleEmployment.be  This test is no t yet approved or cleared by the [place] FDA and  has been authorized for detection and/or diagnosis of SARS-CoV-2 by FDA under an Emergency Use Authorization (EUA). This EUA will remain  in effect (meaning this test can be used) for the duration of the COVID-19 declaration under Section 564(b)(1) of the Act, 21 U.S.C.section 360bbb-3(b)(1), unless the authorization is terminated  or revoked sooner.       Influenza A by PCR NEGATIVE NEGATIVE Final   Influenza B by PCR NEGATIVE NEGATIVE Final    Comment: (NOTE) The Xpert Xpress SARS-CoV-2/FLU/RSV plus assay is intended as an aid in the diagnosis of influenza from Nasopharyngeal swab specimens and should not be used as a sole basis for treatment. Nasal washings and aspirates are unacceptable for Xpert Xpress SARS-CoV-2/FLU/RSV testing.  Fact Sheet for Patients: EntrepreneurPulse.com.au  Fact Sheet for Healthcare  Providers: IncredibleEmployment.be  This test is not yet approved or cleared by the [place] FDA and has been authorized for detection and/or diagnosis of SARS-CoV-2 by FDA under an Emergency Use Authorization (EUA). This EUA will remain in effect (meaning this test can be used) for the duration of the COVID-19 declaration under Section 564(b)(1) of the Act, 21 U.S.C. section 360bbb-3(b)(1), unless the authorization is terminated or revoked.  Performed at [facility], [place] [address], [place], [place] [address]   Blood Culture (routine x 2)     Status: None   Collection Time: 11/05/20  8:36 PM   Specimen: BLOOD  Result Value Ref Range Status   Specimen Description   Final    BLOOD BLOOD RIGHT FOREARM Performed at  [facility], [place] [address] [place], [place] [address]    Special Requests   Final    BOTTLES DRAWN AEROBIC AND ANAEROBIC Blood Culture results may not be optimal due to an inadequate volume of blood received in culture bottles Performed at [place] [address] [place], [place] [address]    Culture   Final    NO GROWTH 5 DAYS Performed at [facility], [place] [address], [place], [place] [address]    Report Status 11/11/2020 FINAL  Final  Blood Culture (routine x 2)     Status: None   Collection Time: 11/05/20  8:38 PM   Specimen: BLOOD  Result Value Ref Range Status   Specimen Description   Final    BLOOD Performed at [place] [address], [place], [place] [address]    Special Requests   Final    BOTTLES DRAWN AEROBIC AND ANAEROBIC Blood Culture results may not be optimal due to an inadequate volume of blood received in culture bottles Performed at [place] [address], [place], [place] [address]    Culture   Final    NO GROWTH 5 DAYS Performed at [facility], [place] [address], [place], [name] [address]     Report Status 11/11/2020 FINAL  Final  Urine culture     Status: Abnormal   Collection Time: 11/05/20  8:38 PM   Specimen: In/Out Cath Urine  Result Value Ref Range Status   Specimen Description   Final    IN/OUT CATH URINE Performed at [place] [address], [place], [place] [address]    Special Requests   Final    NONE Performed at [facility], [place] [address], [place], [place] [address]    Culture 60,000 COLONIES/mL ESCHERICHIA COLI (A)  Final   Report Status 11/08/2020 FINAL  Final   Organism ID, Bacteria ESCHERICHIA COLI (A)  Final      Susceptibility   Escherichia coli - MIC*    AMPICILLIN 4 SENSITIVE Sensitive     CEFAZOLIN <=4 SENSITIVE Sensitive     CEFEPIME <=0.12 SENSITIVE Sensitive     CEFTRIAXONE <=0.25 SENSITIVE Sensitive     CIPROFLOXACIN <=0.25 SENSITIVE Sensitive     GENTAMICIN <=1 SENSITIVE Sensitive     IMIPENEM <=0.25 SENSITIVE Sensitive     NITROFURANTOIN <=16 SENSITIVE Sensitive     TRIMETH/SULFA <=20 SENSITIVE Sensitive     AMPICILLIN/SULBACTAM <=2 SENSITIVE Sensitive     PIP/TAZO <=4 SENSITIVE Sensitive     * 60,000 COLONIES/mL ESCHERICHIA COLI  MRSA PCR Screening     Status: None   Collection Time: 11/06/20  8:42 AM   Specimen: Nasopharyngeal  Result Value Ref Range Status   MRSA by PCR NEGATIVE NEGATIVE Final    Comment:        The GeneXpert MRSA Assay (FDA approved for NASAL specimens only), is one component of a comprehensive MRSA colonization surveillance program. It is not intended to diagnose MRSA infection nor to guide or monitor treatment for MRSA infections. Performed at [facility], [place] [address], [place],  [address]   Aerobic/Anaerobic Culture (surgical/deep wound)     Status: None (Preliminary result)   Collection Time: 11/06/20  3:45 PM   Specimen: Liver; Abscess  Result Value Ref Range Status   Specimen Description   Final    LIVER Performed at [facility]  [address] [place], [place] [address]    Special Requests   Final    NONE Performed at [facility], [place] [address], [place], [place] [address]    Gram Stain   Final    FEW GRAM VARIABLE ROD Performed at [facility], [place] [address], [place], [place] [address]    Culture   Final    ABUNDANT KLEBSIELLA PNEUMONIAE ABUNDANT ESCHERICHIA COLI NO ANAEROBES ISOLATED; CULTURE IN PROGRESS FOR 5 DAYS    Report Status PENDING  Incomplete   Organism ID, Bacteria KLEBSIELLA PNEUMONIAE  Final   Organism ID, Bacteria ESCHERICHIA COLI  Final      Susceptibility   Escherichia coli - MIC*    AMPICILLIN <=2 SENSITIVE Sensitive     CEFAZOLIN <=4 SENSITIVE Sensitive     CEFEPIME <=0.12 SENSITIVE Sensitive     CEFTAZIDIME <=1 SENSITIVE Sensitive     CEFTRIAXONE <=0.25 SENSITIVE Sensitive     CIPROFLOXACIN <=0.25 SENSITIVE Sensitive     GENTAMICIN <=1 SENSITIVE Sensitive     IMIPENEM <=0.25 SENSITIVE Sensitive     TRIMETH/SULFA <=20 SENSITIVE Sensitive     AMPICILLIN/SULBACTAM <=2 SENSITIVE Sensitive     PIP/TAZO <=4 SENSITIVE Sensitive     * ABUNDANT ESCHERICHIA COLI   Klebsiella pneumoniae - MIC*    AMPICILLIN >=32 RESISTANT Resistant     CEFAZOLIN <=4 SENSITIVE Sensitive     CEFEPIME <=0.12 SENSITIVE Sensitive     CEFTAZIDIME <=1 SENSITIVE Sensitive     CEFTRIAXONE <=0.25 SENSITIVE Sensitive     CIPROFLOXACIN <=0.25 SENSITIVE Sensitive     GENTAMICIN <=1 SENSITIVE Sensitive     IMIPENEM <=0.25 SENSITIVE Sensitive     TRIMETH/SULFA <=20 SENSITIVE Sensitive     AMPICILLIN/SULBACTAM 16 INTERMEDIATE Intermediate     PIP/TAZO 16 SENSITIVE Sensitive     * ABUNDANT KLEBSIELLA PNEUMONIAE  Aerobic/Anaerobic Culture w Gram Stain (surgical/deep wound)     Status: None (Preliminary result)   Collection Time: 11/06/20  3:46 PM   Specimen: Liver; Abscess  Result Value Ref Range Status   Specimen Description LIVER ABSCESS  Final   Special  Requests MORE INFERIOR RIGHT LIVER  Final   Gram Stain   Final    FEW WBC PRESENT, PREDOMINANTLY PMN FEW GRAM NEGATIVE RODS RARE GRAM POSITIVE COCCI IN PAIRS Performed at [facility], [place] [address], [place], [place] [address]    Culture   Final    ABUNDANT ESCHERICHIA COLI ABUNDANT STREPTOCOCCUS ANGINOSIS NO ANAEROBES ISOLATED; CULTURE IN PROGRESS FOR 5 DAYS    Report Status PENDING  Incomplete   Organism ID, Bacteria ESCHERICHIA COLI  Final   Organism ID, Bacteria STREPTOCOCCUS ANGINOSIS  Final      Susceptibility   Escherichia coli - MIC*    AMPICILLIN 4 SENSITIVE Sensitive     CEFAZOLIN <=4 SENSITIVE Sensitive     CEFEPIME <=0.12 SENSITIVE Sensitive     CEFTAZIDIME <=1 SENSITIVE Sensitive     CEFTRIAXONE <=0.25 SENSITIVE Sensitive     CIPROFLOXACIN <=0.25 SENSITIVE Sensitive     GENTAMICIN <=1 SENSITIVE Sensitive     IMIPENEM <=0.25 SENSITIVE Sensitive     TRIMETH/SULFA <=20 SENSITIVE Sensitive     AMPICILLIN/SULBACTAM <=2 SENSITIVE Sensitive     PIP/TAZO <=4 SENSITIVE Sensitive     * ABUNDANT ESCHERICHIA COLI   Streptococcus anginosis - MIC*    PENICILLIN <=0.06 SENSITIVE Sensitive     CEFTRIAXONE 0.5 SENSITIVE Sensitive     ERYTHROMYCIN <=0.12 SENSITIVE Sensitive     LEVOFLOXACIN  0.5 SENSITIVE Sensitive     VANCOMYCIN 1 SENSITIVE Sensitive     * ABUNDANT STREPTOCOCCUS ANGINOSIS         Radiology Studies: No results found.    Scheduled Meds: . Chlorhexidine Gluconate Cloth  6 each Topical Daily  . insulin aspart  0-15 Units Subcutaneous TID WC  . insulin aspart  0-5 Units Subcutaneous QHS  . insulin detemir  10 Units Subcutaneous BID  . ipratropium  0.5 mg Nebulization BID  . levalbuterol  0.63 mg Nebulization BID  . levETIRAcetam  1,000 mg Oral BID  . lipase/protease/amylase  12,000 Units Oral TID with meals  . loratadine  10 mg Oral Daily  . mouth rinse  15 mL Mouth Rinse BID  . metroNIDAZOLE  500 mg Oral Q8H  . pantoprazole  40 mg Oral Q1200   . sodium chloride flush  5 mL Intracatheter Q8H  . tamsulosin  0.4 mg Oral Daily   Continuous Infusions: . sodium chloride 10 mL/hr at 11/11/20 1400  . cefTRIAXone (ROCEPHIN)  IV Stopped (11/11/20 1008)     LOS: 5 days      Debbe Odea, MD [facility] Pager: [email or web address] 11/11/2020, 2:29 PM

## 2020-11-11 NOTE — Telephone Encounter (Signed)
LM for pt's wife that Dr. [name] and Dr. [name] recommend delaying treatment start for radiation. We will move his treatment 2-3 weeks out and consider moving forward if he's clinically doing well.

## 2020-11-11 NOTE — Progress Notes (Signed)
His speech continues to improve, he is now able to use full sentences, though still with increased latency of speech.  He no longer has drift on his right side.  No further seizures since his initial seizure with Keppra initiation.  I would continue Keppra at least in the intermediate term, possibly repeating an EEG as an outpatient prior to stopping it.  No further inpatient recommendations at this time, he can follow-up with outpatient neurology.  Please call with further questions or concerns.  Roland Rack, MD [facility] [phone]  If 7pm- 7am, please page neurology on call as listed in [place].

## 2020-11-11 NOTE — Progress Notes (Signed)
Referring Physician(s):  Dr. Marlyce Huge  Supervising Physician: Jacqulynn Cadet  Patient Status:  [facility] - In-pt  Chief Complaint:  Hepatic abscesses  Subjective: Sitting in chair without complaint. Conversant.  Denies complaints related to drains.  Hepatic abscess drain sites c/d/i.   Allergies: Patient has no known allergies.  Medications: Prior to Admission medications   Medication Sig Start Date End Date Taking? Authorizing Provider  acetaminophen (TYLENOL) 500 MG tablet Take 1,000 mg by mouth every 6 (six) hours as needed for moderate pain or headache.   Yes [provider]  amLODipine (NORVASC) 10 MG tablet Take 1 tablet (10 mg total) by mouth daily. 01/13/20  Yes [name], MD  apixaban (ELIQUIS) 2.5 MG TABS tablet Take 1 tablet (2.5 mg total) by mouth 2 (two) times daily. 10/27/20 11/26/20 Yes Cherlynn June B, PA  benazepril (LOTENSIN) 10 MG tablet TAKE 1 TABLET DAILY Patient taking differently: Take 10 mg by mouth daily. 10/04/20  Yes [name], MD  CREON 6000-19000 units CPEP Take 1 capsule by mouth with breakfast, with lunch, and with evening meal.  04/15/20  Yes [provider]  diphenhydrAMINE HCl, Sleep, 25 MG TBDP Take 25 mg by mouth at bedtime as needed (sleep).   Yes [provider]  ferrous sulfate 325 (65 FE) MG tablet Take 325 mg daily with breakfast by mouth.   Yes [provider]  [employer] [facility]) OINT Apply 1 application topically 2 (two) times daily. T buttocks   Yes [provider]  insulin detemir (LEVEMIR) 100 UNIT/ML FlexPen Inject 25-35 Units into the skin See admin instructions. Inject 35 units in the morning and 25 units at night Patient taking differently: Inject 10-15 Units into the skin See admin instructions. Takes 10 units in the morning and 15 units every evening 10/28/20  Yes [place] Donnamarie Poag, DO  loratadine (CLARITIN) 10 MG tablet Take 10 mg by mouth daily.   Yes [provider]  melatonin 3 MG TABS tablet Take 3 mg by mouth at bedtime.   Yes [provider]  Multiple Vitamin (MULTIVITAMIN WITH MINERALS) TABS tablet Take 1 tablet by mouth daily.   Yes [provider]  NOVOLOG FLEXPEN 100 UNIT/ML FlexPen GIVE EVERY MORNING WITH BREAKFAST AND EVERY EVENING WITH SUPPER PER SLIDING SCALE Patient taking differently: Inject 2-13 Units into the skin 2 (two) times daily. Sliding Scale : 101-150 - 2u 151-200 - 3u 201-250 - 5u 251-300 - 7u 301-350 - 9u 351- 400 - 11u 401-450 - 13u Greater than 150, alert MD 04/17/17  Yes [name], MD  Omega-3 Fatty Acids (FISH OIL) 500 MG CAPS Take 500 mg by mouth daily.   Yes [provider]  omeprazole (PRILOSEC) 20 MG capsule TAKE 1 CAPSULE DAILY Patient taking differently: Take 20 mg by mouth daily. 12/02/19  Yes [name], MD  oxyCODONE (OXY IR/ROXICODONE) 5 MG immediate release tablet Take 5 mg by mouth every 4 (four) hours as needed for severe pain or moderate pain.   Yes [provider]  potassium chloride SA (KLOR-CON M20) 20 MEQ tablet Take 1 tablet (20 mEq total) by mouth daily. 07/22/20  Yes [name], MD  rosuvastatin (CRESTOR) 20 MG tablet Take 1 tablet (20 mg total) by mouth daily. 07/22/20  Yes [name], MD  tamsulosin (FLOMAX) 0.4 MG CAPS capsule Take 1 capsule (0.4 mg total) by mouth daily. 10/29/20  Yes [place] Donnamarie Poag, DO  B-D UF  III MINI PEN NEEDLES 31G X 5 MM MISC Inject into the skin. 07/12/20   [provider]  docusate sodium (COLACE) 100 MG capsule Take 1 capsule (100 mg total) by mouth 2 (two) times daily. Patient not taking: Reported on 11/06/2020 10/28/20   [place] Donnamarie Poag, DO  FREESTYLE LITE test strip USE ONE STRIP TO CHECK GLUCOSE ONCE DAILY. PLEASE  SCHEDULE FOLLOW UP 11/09/16   [name], MD  Insulin Pen Needle 30G X 5 MM MISC Use one daily with insulin 03/04/16   [place], Gay Filler, MD  Lancets (FREESTYLE) lancets USE AS INSTRUCTED TO  CHECK BLOOD SUGAR ONCE A DAY 06/02/16   [name], MD     Vital Signs: BP 131/66   Pulse 75   Temp 98.3 F (36.8 C) (Oral)   Resp 19   Ht 5\' 10"  (1.778 m)   Wt 190 lb 4.1 oz (86.3 kg)   SpO2 93%   BMI 27.30 kg/m   Physical Exam Vitals and nursing note reviewed.  Abdominal:     Comments: Superior (#1) and inferior (#2) hepatic abscess drain (#1) sites without erythema, drainage, or active bleeding. Superior hepatic abscess drain (#1) stable with minimal output of clear yellow fluid in suction bulb, however thick possibly purulent-appearing fluid in tubing Inferior hepatic abscess drain (#2) stable with minimal output of dark yellow fluid in suction bulb.     Imaging: CT HEAD WO CONTRAST  Result Date: 11/07/2020 CLINICAL DATA:  Subdural hematoma EXAM: CT HEAD WITHOUT CONTRAST TECHNIQUE: Contiguous axial images were obtained from the base of the skull through the vertex without intravenous contrast. COMPARISON:  Brain MRI same day FINDINGS: Brain: Left holohemispheric subdural collection is diffusely hypodense and measures 10 mm in thickness. Mass effect on the left hemisphere with only trace midline shift. No hydrocephalus. No intraparenchymal hemorrhage. Vascular: No hyperdense vessel or unexpected calcification. Skull: Normal. Negative for fracture or focal lesion. Sinuses/Orbits: No acute finding. Other: None. IMPRESSION: Left holohemispheric subdural collection measuring 10 mm in thickness with mild mass effect on the left hemisphere and only trace midline shift. This is most consistent with a late subacute or chronic subdural hematoma. Electronically Signed   By: Ulyses Jarred M.D.   On: 11/07/2020 21:58   MR BRAIN WO CONTRAST  Addendum Date: 11/07/2020   ADDENDUM REPORT: 11/07/2020 20:54 ADDENDUM: Critical Value/emergent results were called by telephone at the time of interpretation on 11/07/2020 at 8:54 pm to provider Lovey Newcomer , who verbally acknowledged these results.  Electronically Signed   By: Ulyses Jarred M.D.   On: 11/07/2020 20:54   Result Date: 11/07/2020 CLINICAL DATA:  Altered mental status EXAM: MRI HEAD WITHOUT CONTRAST TECHNIQUE: Multiplanar, multiecho pulse sequences of the brain and surrounding structures were obtained without intravenous contrast. COMPARISON:  None. FINDINGS: Brain: Left holo hemispheric extra-axial collection, likely hematoma, measuring 10 mm in thickness. Mild mass effect on the left hemisphere with minimal midline shift. There is some subarachnoid blood over the left frontal lobe. Areas of abnormal diffusion restriction in the left temporal and parietal lobes. No intraparenchymal hemorrhage. There is multifocal hyperintense T2-weighted signal within the white matter. Generalized volume loss without a clear lobar predilection. The midline structures are normal. Vascular: Major flow voids are preserved. Skull and upper cervical spine: Normal calvarium and skull base. Visualized upper cervical spine and soft tissues are normal. Sinuses/Orbits:No paranasal sinus fluid levels or advanced mucosal thickening. No mastoid or middle ear effusion. Normal orbits. IMPRESSION: 1. Age indeterminate, mixed left  subarachnoid and subdural hematoma measuring 10 mm in thickness. Head CT recommended for better temporal characterization. 2. Areas of suspected acute ischemia in the left temporal and parietal lobes, though diffusion-weighted imaging is complicated by the adjacent blood products and their magnetic susceptibility effects. Electronically Signed: By: Ulyses Jarred M.D. On: 11/07/2020 20:33   DG CHEST PORT 1 VIEW  Result Date: 11/08/2020 CLINICAL DATA:  Dyspnea EXAM: PORTABLE CHEST 1 VIEW COMPARISON:  11/07/2020 FINDINGS: Mild right basilar atelectasis or infiltrate again noted, better appreciated on prior CT examination of 11/06/2020. Mild right-sided volume loss is unchanged. No pneumothorax or pleural effusion. Cardiac size within normal limits.  Pulmonary vascularity is normal. Pigtail catheter noted overlying the right upper quadrant. IMPRESSION: Stable right-sided volume loss and mild right basilar atelectasis or infiltrate. Electronically Signed   By: Fidela Salisbury MD   On: 11/08/2020 06:57   DG Swallowing Func-Speech Pathology  Result Date: 11/08/2020 Objective Swallowing Evaluation: Type of Study: MBS-Modified Barium Swallow Study  Patient Details Name: Colton Bush MRN: [id number] Date of Birth: 04-29-1956 Today's Date: 11/08/2020 Time: SLP Start Time (ACUTE ONLY): 1093 -SLP Stop Time (ACUTE ONLY): 1055 SLP Time Calculation (min) (ACUTE ONLY): 15 min Past Medical History: Past Medical History: Diagnosis Date . Arthritis   left hand . Bronchitis 1977 . Cancer ([place]) 03/09/2016  pancreatic cancer, sees Dr. Cristino Martes at [facility]  . Depression   takes Cymbalta daily . Diabetes mellitus type II   sees Dr. Chalmers Cater  . GERD (gastroesophageal reflux disease)   takes Omeprazole daily . H/O hiatal hernia  . Hyperlipidemia   takes Zocor daily . Hypertension   takes Amlodipine daily . Hypoglycemia 06/18/2017 . Neck pain   C4-7 stenosis and herniated disc . Neuromuscular disorder ([place])   hiatal hernia . Scoliosis   slight . Spinal cord injury, C5-C7 ([place])   c4-c7 . Stiffness of hand joint   d/t cervical issues Past Surgical History: Past Surgical History: Procedure Laterality Date . ANTERIOR CERVICAL DECOMP/DISCECTOMY FUSION  08/18/2011  Procedure: ANTERIOR CERVICAL DECOMPRESSION/DISCECTOMY FUSION 3 LEVELS;  Surgeon: Winfield Cunas, MD;  Location: [place];  Service: Neurosurgery;  Laterality: N/A;  Anterior Cervical Four-Five/Five-Six/Six-Seven Decompression with Fusion, Plating, and Bonegraft . CARPAL TUNNEL RELEASE  2013  bilateral, per Dr. Christella Noa  . COLONOSCOPY  10-30-14  per Dr. Olevia Perches, clear, repeat in 10 yrs  . egd with esophageal dilation  9-08  per Dr. Olevia Perches . ERCP N/A 03/01/2016  Procedure: ENDOSCOPIC RETROGRADE CHOLANGIOPANCREATOGRAPHY (ERCP) with  brushings and stent;  Surgeon: Doran Stabler, MD;  Location: [facility];  Service: Endoscopy;  Laterality: N/A; . ESOPHAGOGASTRODUODENOSCOPY (EGD) WITH PROPOFOL N/A 05/27/2020  Procedure: ESOPHAGOGASTRODUODENOSCOPY (EGD) WITH PROPOFOL;  Surgeon: Milus Banister, MD;  Location: [facility];  Service: Endoscopy;  Laterality: N/A; . EUS N/A 03/09/2016  Procedure: ESOPHAGEAL ENDOSCOPIC ULTRASOUND (EUS) RADIAL;  Surgeon: Milus Banister, MD;  Location: [facility];  Service: Endoscopy;  Laterality: N/A; . EUS N/A 05/27/2020  Procedure: UPPER ENDOSCOPIC ULTRASOUND (EUS) RADIAL;  Surgeon: Milus Banister, MD;  Location: [facility];  Service: Endoscopy;  Laterality: N/A; . FEMUR IM NAIL Right 10/25/2020  Procedure: INTRAMEDULLARY (IM) NAIL FEMORAL;  Surgeon: Rod Can, MD;  Location: [facility];  Service: Orthopedics;  Laterality: Right; . lymph nodes biopsy   . melanoma rt calf  1999 . PORT-A-CATH REMOVAL N/A 04/03/2019  Procedure: PORT REMOVAL;  Surgeon: Stark Klein, MD;  Location: [facility];  Service: General;  Laterality: N/A; . PORTACATH PLACEMENT Left 03/22/2016  Procedure: INSERTION PORT-A-CATH;  Surgeon: Stark Klein, MD;  Location: [facility];  Service: General;  Laterality: Left; . SPINE SURGERY   . TONSILLECTOMY    as a child . ULNAR TUNNEL RELEASE  2013  right arm, per Dr. Christella Noa  . UPPER GASTROINTESTINAL ENDOSCOPY   . WHIPPLE PROCEDURE N/A 09/19/2016  Procedure: DIAGNOSTIC LAPAROSCOPY, LAPAROSCOPIC LIVER BIOPSY, RETROPERITONEAL EXPLORATION, INTRAOPERATIVE ULTRASOUND;  Surgeon: Stark Klein, MD;  Location: [place];  Service: General;  Laterality: N/A; HPI: Patient is a 65 year old Caucasian male with a past medical history significant for but not limited to pancreatic cancer diagnosed in 2017 with recent recurrence, diabetes mellitus type 2, hypertension, hyperlipidemia, chronic diastolic CHF with an EF of 55 to 60% and grade 1 diastolic dysfunction back in March 2022, recent right hip fracture status post  repair and intramedullary nail as well as other comorbidities who presented to [facility] ED with a chief complaint of a sudden change in mental status. In the ED is found to be septic and initial source was thought to be another UTI but then further work-up revealed that he had a liver abscess.  2 drains placed in his liver abscess  Subjective: alert but drowsy Assessment / Plan / Recommendation CHL IP CLINICAL IMPRESSIONS 11/08/2020 Clinical Impression Patient presents with a mild-mod oropharyngeal dysphagia with likely impact from current level of cognitive delays and decreased overall alertness. During oral phase, patient exhibited delays in anterior to posterior transit of thin liquids and puree solids boluses and with barium tablet in puree, exhibited mild premature spillage of puree into vallecular sinus likely due to decreased bolus cohesion. trace to mild oral residuals post initial swallow which eventually cleared with uncued subsequent swallows. During pharyngeal phase of swallow, patient exhibited min vallecular residuals with puree solids, trace to min vallecular, pyriform and posterior pharynx residuals with thin liquids. Residuals were observed to fully clear pharynx during course of the study. Flash penetration was observed with thin liquids via straw sips and cup sips, however penetrate remained above vocal cords and fully cleared laryngeal vestibule. Barium tablet transited throught pharynx without difficulty. Slight narrowing of esophagus observed at level of approximately C4-6 where he has cervical hardware from h/o surgery. This narrowing did not result in significant stasis of boluses or significantly impact pharyngeal and upper esophageal transit. SLP recommending to initiate full liquids diet and with plan to work on upgraded solids trials at bedside. SLP Visit Diagnosis -- Attention and concentration deficit following -- Frontal lobe and executive function deficit following -- Impact on  safety and function --   CHL IP TREATMENT RECOMMENDATION 11/08/2020 Treatment Recommendations Therapy as outlined in treatment plan below   Prognosis 11/08/2020 Prognosis for Safe Diet Advancement Good Barriers to Reach Goals -- Barriers/Prognosis Comment -- CHL IP DIET RECOMMENDATION 11/08/2020 SLP Diet Recommendations Other (Comment);Thin liquid Liquid Administration via Cup;Straw Medication Administration Whole meds with puree Compensations Minimize environmental distractions;Slow rate;Small sips/bites Postural Changes Seated upright at 90 degrees   CHL IP OTHER RECOMMENDATIONS 11/08/2020 Recommended Consults -- Oral Care Recommendations Oral care BID;Staff/trained caregiver to provide oral care Other Recommendations --   CHL IP FOLLOW UP RECOMMENDATIONS 11/08/2020 Follow up Recommendations 24 hour supervision/assistance;Skilled Nursing facility   [facility] IP FREQUENCY AND DURATION 11/08/2020 Speech Therapy Frequency (ACUTE ONLY) min 2x/week Treatment Duration 1 week      CHL IP ORAL PHASE 11/08/2020 Oral Phase -- Oral - Pudding Teaspoon -- Oral - Pudding Cup -- Oral - Honey Teaspoon -- Oral - Honey Cup -- Oral -  Nectar Teaspoon -- Oral - Nectar Cup -- Oral - Nectar Straw -- Oral - Thin Teaspoon -- Oral - Thin Cup Delayed oral transit;Weak lingual manipulation;Holding of bolus;Reduced posterior propulsion;Lingual/palatal residue Oral - Thin Straw Lingual/palatal residue;Delayed oral transit;Weak lingual manipulation;Reduced posterior propulsion Oral - Puree Delayed oral transit;Weak lingual manipulation;Lingual/palatal residue;Reduced posterior propulsion Oral - Mech Soft -- Oral - Regular -- Oral - Multi-Consistency -- Oral - Pill Decreased bolus cohesion;Delayed oral transit;Reduced posterior propulsion;Weak lingual manipulation;Premature spillage Oral Phase - Comment --  CHL IP PHARYNGEAL PHASE 11/08/2020 Pharyngeal Phase -- Pharyngeal- Pudding Teaspoon -- Pharyngeal -- Pharyngeal- Pudding Cup -- Pharyngeal -- Pharyngeal-  Honey Teaspoon -- Pharyngeal -- Pharyngeal- Honey Cup -- Pharyngeal -- Pharyngeal- Nectar Teaspoon -- Pharyngeal -- Pharyngeal- Nectar Cup -- Pharyngeal -- Pharyngeal- Nectar Straw -- Pharyngeal -- Pharyngeal- Thin Teaspoon -- Pharyngeal -- Pharyngeal- Thin Cup Pharyngeal residue - valleculae;Pharyngeal residue - pyriform;Pharyngeal residue - posterior pharnyx Pharyngeal Material enters airway, remains ABOVE vocal cords then ejected out Pharyngeal- Thin Straw Penetration/Aspiration during swallow;Pharyngeal residue - pyriform;Pharyngeal residue - posterior pharnyx;Pharyngeal residue - valleculae Pharyngeal Material enters airway, remains ABOVE vocal cords then ejected out Pharyngeal- Puree Pharyngeal residue - valleculae Pharyngeal -- Pharyngeal- Mechanical Soft -- Pharyngeal -- Pharyngeal- Regular -- Pharyngeal -- Pharyngeal- Multi-consistency -- Pharyngeal -- Pharyngeal- Pill WFL Pharyngeal -- Pharyngeal Comment --  CHL IP CERVICAL ESOPHAGEAL PHASE 11/08/2020 Cervical Esophageal Phase Impaired Pudding Teaspoon -- Pudding Cup -- Honey Teaspoon -- Honey Cup -- Nectar Teaspoon -- Nectar Cup -- Nectar Straw -- Thin Teaspoon -- Thin Cup Other (Comment) Thin Straw -- Puree -- Mechanical Soft -- Regular -- Multi-consistency -- Pill -- Cervical Esophageal Comment -- Sonia Baller, MA, CCC-SLP Speech Therapy             EEG adult  Result Date: 11/08/2020 Plancher, Baron Sane, MD     11/08/2020  4:46 PM PROCEDURE: EEG video routine 23 minutesstudy DATES OF TEST: 11/08/20 REASON FOR TEST: Seizure AED/Sedative MEDICATIONS: Keppra TECHNIQUE: This is an 18 channel digital EEG recording using the standard international 10/20 system of electrode placement with one channel EKG recording. Pt was awake and drowsy during this recording. FINDINGS: Background rhythm:symmetric 8.5-12 Hz  With intermittent asymmetric left 3-5 Hz Amplitude : normal Breach effect:  NO Variability: NO Reactivity: NO Rhythmic delta activity:NO Periodic  discharges: NO Sporadic epileptiform discharges:NO Electrographic/electroclinical seizures:NO Limited EKG reveals no abnormalities. IMPRESSION: This is an abnormal study due to - 1. L Focal slowing is consistent with a focal disturbance of cerebral function and an underlying structural lesion should be considered. 2. No definite epileptiform discharges or electrographic seizures noted.   Overnight EEG with video  Result Date: 11/09/2020 Lora Havens, MD     11/10/2020  4:09 AM Patient Name: Colton Bush MRN: [id number] Epilepsy Attending: Lora Havens Referring Physician/Provider: Dr Roland Rack Duration: 11/08/2020 2157 to 11/09/2020 1008 Patient history: 65 year old male with a history of pancreatic adenocarcinoma with recurrence, recent fall and admission for sepsis, now with seizures. EEG to evaluate for seizure Level of alertness: Awake, asleep AEDs during EEG study: LEV Technical aspects: This EEG study was done with scalp electrodes positioned according to the 10-20 International system of electrode placement. Electrical activity was acquired at a sampling rate of 500Hz  and reviewed with a high frequency filter of 70Hz  and a low frequency filter of 1Hz . EEG data were recorded continuously and digitally stored. Description: The posterior dominant rhythm consists of 9 Hz activity of moderate voltage (25-35 uV) seen  predominantly in posterior head regions, symmetric and reactive to eye opening and eye closing.  Sleep was characterized by vertex waves, sleep spindles (12 to 14 Hz), maximal frontocentral region.  EEG showed intermittent 3 to 6 Hz theta-delta slowing in left temporal region. Hyperventilation and photic stimulation were not performed.   ABNORMALITY - Intermittent slow, left temporal region IMPRESSION: This study is suggestive of cortical dysfunction arising from left temporal region, nonspecific etiology but likely secondary to underlying subdural Hematoma. No seizures or definite  epileptiform discharges were seen throughout the recording. Priyanka O Yadav    Labs:  CBC: Recent Labs    11/08/20 0237 11/09/20 0132 11/09/20 1251 11/10/20 0705 11/10/20 0829  WBC 11.7* 10.3  --  9.6 11.0*  HGB 9.9* 9.6* 10.2* 10.0* 10.9*  HCT 31.1* 29.4* 30.0* 30.6* 33.5*  PLT 410* 386  --  415* 469*    COAGS: Recent Labs    10/19/20 0259 10/24/20 0814 11/05/20 1930 11/06/20 0337  INR 1.4* 1.2 1.5* 1.6*  APTT  --   --  32 36    BMP: Recent Labs    04/14/20 0925 07/09/20 0825 11/09/20 0132 11/09/20 0801 11/09/20 1251 11/10/20 0705 11/10/20 0829  NA 141   < > 136 137 140 136 135  K 3.3*   < > 3.2* 3.0* 3.0* 3.3* 3.6  CL 107   < > 103 101  --  102 101  CO2 27   < > 27 28  --  30 26  GLUCOSE 73   < > 140* 179*  --  209* 210*  BUN 9   < > 5* <5*  --  6* 7*  CALCIUM 8.8*   < > 7.5* 7.7*  --  8.0* 8.0*  CREATININE 0.71   < > 0.44* 0.42*  --  0.42* 0.44*  GFRNONAA >60   < > >60 >60  --  >60 >60  GFRAA >60  --   --   --   --   --   --    < > = values in this interval not displayed.    LIVER FUNCTION TESTS: Recent Labs    11/06/20 1424 11/07/20 0124 11/08/20 0237 11/09/20 0132  BILITOT 0.1* 0.8 0.7 0.6  AST 29 45* 50* 27  ALT 22 28 40 31  ALKPHOS 252* 230* 258* 234*  PROT 6.7 6.2* 6.2* 5.6*  ALBUMIN 2.2* 2.1* 2.1* 1.7*    Assessment and Plan: Hepatic abscesses s/p hepatic drain placement x2 in IR 11/06/2020. Drains remain in place.  Insertion sites intact.  Cultures were positive for abundant E coli, abundant Strep angionosis On rocephin and flagyl.  Flush orders in place.  Continue current drain management.  IR to follow.   Electronically Signed: Docia Barrier, PA 11/11/2020, 3:30 PM   I spent a total of 15 Minutes at the the patient's bedside AND on the patient's hospital floor or unit, greater than 50% of which was counseling/coordinating care for hepatic abscesses.

## 2020-11-11 NOTE — Telephone Encounter (Signed)
Scheduled per 04/13 scheduled message, patient has been called and voicemail was left.

## 2020-11-11 NOTE — Consult Note (Signed)
Physical Medicine and Rehabilitation Consult   Reason for Consult: Functional deficits Referring Physician: Dr. Wynelle Cleveland   HPI: Colton Bush is a 64 y.o. male with history of T2DM, pancreatic cancer with recurrance, recent admission for fall with hip fracture s/p ORIF 03/28 and found to urinary retention treated with foley and was discharged to [facility] for rehab. he was readmitted on 11/05/20 with mental status changes due SIRS and found ot have multiple liver abscesses. He underwent IR guided aspiration with drain placement revealing strep anginousus and Unasyn recommended by Dr. Tommy Medal. He continued to have issues with confusion and MRI brain done revealing suspected areas of acute ischemia in left temporal and parietal lobes but with diffusion changes. CT head revealed late subacute or chronic SDH measuring 10 mm thickness. On 04/11, he developed seizures and was loaded with Keppra. Dr. Venetia Constable consulted for input due to concern of SDH v/s empyema and patient was taken to OR on 04/12 for burr hole evacuation of empyema. Post op with significant improvement in speech and mentation and DrMarland Kitchen Addison Lank recommended continuing Keppra 1 gram bid. Therapy evaluations completed yesterday revealing posterior bias with standing attempts, knee instability as well as poor awareness of deficits. CIR recommended due to functional decline.   Has been sitting 2.5 hours in bedside chair. Thinks "he's ready to walk".  Ice pack on knees.  Pain not bed- denies SOB.  Has foley due to BPH.  Denies constipation-   Review of Systems  Constitutional: Negative for chills and fever.  HENT: Negative for hearing loss and tinnitus.   Eyes: Positive for discharge. Negative for blurred vision.  Respiratory: Negative for shortness of breath.   Cardiovascular: Negative for chest pain and palpitations.  Gastrointestinal: Negative for heartburn and nausea.  Genitourinary: Negative for dysuria.  Musculoskeletal:  Positive for back pain, joint pain and myalgias.  Skin: Negative for rash.  Neurological: Positive for weakness and headaches.  Psychiatric/Behavioral: The patient is not nervous/anxious.   All other systems reviewed and are negative.     Past Medical History:  Diagnosis Date  . Arthritis    left hand  . Bronchitis 1977  . Cancer ([place]) 03/09/2016   pancreatic cancer, sees Dr. Cristino Martes at [facility]   . Depression    takes Cymbalta daily  . Diabetes mellitus type II    sees Dr. Chalmers Cater   . GERD (gastroesophageal reflux disease)    takes Omeprazole daily  . H/O hiatal hernia   . Hyperlipidemia    takes Zocor daily  . Hypertension    takes Amlodipine daily  . Hypoglycemia 06/18/2017  . Neck pain    C4-7 stenosis and herniated disc  . Neuromuscular disorder ([place])    hiatal hernia  . Scoliosis    slight  . Spinal cord injury, C5-C7 ([place])    c4-c7  . Stiffness of hand joint    d/t cervical issues    Past Surgical History:  Procedure Laterality Date  . ANTERIOR CERVICAL DECOMP/DISCECTOMY FUSION  08/18/2011   Procedure: ANTERIOR CERVICAL DECOMPRESSION/DISCECTOMY FUSION 3 LEVELS;  Surgeon: Winfield Cunas, MD;  Location: [place] [facility];  Service: Neurosurgery;  Laterality: N/A;  Anterior Cervical Four-Five/Five-Six/Six-Seven Decompression with Fusion, Plating, and Bonegraft  . BURR HOLE Left 11/09/2020   Procedure: LEFT BURR HOLES FOR EVACUATION OF Subdural Hematoma;  Surgeon: Judith Part, MD;  Location: [place];  Service: Neurosurgery;  Laterality: Left;  . CARPAL TUNNEL RELEASE  2013   bilateral, per  Dr. Christella Noa   . COLONOSCOPY  10-30-14   per Dr. Olevia Perches, clear, repeat in 10 yrs   . egd with esophageal dilation  9-08   per Dr. Olevia Perches  . ERCP N/A 03/01/2016   Procedure: ENDOSCOPIC RETROGRADE CHOLANGIOPANCREATOGRAPHY (ERCP) with brushings and stent;  Surgeon: Doran Stabler, MD;  Location: [facility];  Service: Endoscopy;  Laterality: N/A;  . ESOPHAGOGASTRODUODENOSCOPY (EGD)  WITH PROPOFOL N/A 05/27/2020   Procedure: ESOPHAGOGASTRODUODENOSCOPY (EGD) WITH PROPOFOL;  Surgeon: Milus Banister, MD;  Location: [facility];  Service: Endoscopy;  Laterality: N/A;  . EUS N/A 03/09/2016   Procedure: ESOPHAGEAL ENDOSCOPIC ULTRASOUND (EUS) RADIAL;  Surgeon: Milus Banister, MD;  Location: [facility];  Service: Endoscopy;  Laterality: N/A;  . EUS N/A 05/27/2020   Procedure: UPPER ENDOSCOPIC ULTRASOUND (EUS) RADIAL;  Surgeon: Milus Banister, MD;  Location: [facility];  Service: Endoscopy;  Laterality: N/A;  . FEMUR IM NAIL Right 10/25/2020   Procedure: INTRAMEDULLARY (IM) NAIL FEMORAL;  Surgeon: Rod Can, MD;  Location: [facility];  Service: Orthopedics;  Laterality: Right;  . lymph nodes biopsy    . melanoma rt calf  1999  . PORT-A-CATH REMOVAL N/A 04/03/2019   Procedure: PORT REMOVAL;  Surgeon: Stark Klein, MD;  Location: [place];  Service: General;  Laterality: N/A;  . PORTACATH PLACEMENT Left 03/22/2016   Procedure: INSERTION PORT-A-CATH;  Surgeon: Stark Klein, MD;  Location: [facility];  Service: General;  Laterality: Left;  . SPINE SURGERY    . TONSILLECTOMY     as a child  . ULNAR TUNNEL RELEASE  2013   right arm, per Dr. Christella Noa   . UPPER GASTROINTESTINAL ENDOSCOPY    . WHIPPLE PROCEDURE N/A 09/19/2016   Procedure: DIAGNOSTIC LAPAROSCOPY, LAPAROSCOPIC LIVER BIOPSY, RETROPERITONEAL EXPLORATION, INTRAOPERATIVE ULTRASOUND;  Surgeon: Stark Klein, MD;  Location: [facility];  Service: General;  Laterality: N/A;    Family History  Problem Relation Age of Onset  . Heart disease Father   . Heart disease Brother 64  . Anesthesia problems Mother   . Heart disease Mother   . Dementia Mother   . Diabetes Sister   . Stroke Sister   . Colon cancer Neg Hx   . Rectal cancer Neg Hx   . Stomach cancer Neg Hx     Social History:  Married. Independent prior to fall last month. Per reports that he has never smoked. He has never used smokeless tobacco. He reports that he does not  drink alcohol and does not use drugs.    Allergies: No Known Allergies    Medications Prior to Admission  Medication Sig Dispense Refill  . acetaminophen (TYLENOL) 500 MG tablet Take 1,000 mg by mouth every 6 (six) hours as needed for moderate pain or headache.    Marland Kitchen amLODipine (NORVASC) 10 MG tablet Take 1 tablet (10 mg total) by mouth daily. 90 tablet 3  . apixaban (ELIQUIS) 2.5 MG TABS tablet Take 1 tablet (2.5 mg total) by mouth 2 (two) times daily. 60 tablet 0  . benazepril (LOTENSIN) 10 MG tablet TAKE 1 TABLET DAILY (Patient taking differently: Take 10 mg by mouth daily.) 90 tablet 1  . CREON 6000-19000 units CPEP Take 1 capsule by mouth with breakfast, with lunch, and with evening meal.     . diphenhydrAMINE HCl, Sleep, 25 MG TBDP Take 25 mg by mouth at bedtime as needed (sleep).    . ferrous sulfate 325 (65 FE) MG tablet Take 325 mg daily with breakfast by mouth.    Marland Kitchen  Infant [employer] [facility]) OINT Apply 1 application topically 2 (two) times daily. T buttocks    . insulin detemir (LEVEMIR) 100 UNIT/ML FlexPen Inject 25-35 Units into the skin See admin instructions. Inject 35 units in the morning and 25 units at night (Patient taking differently: Inject 10-15 Units into the skin See admin instructions. Takes 10 units in the morning and 15 units every evening)    . loratadine (CLARITIN) 10 MG tablet Take 10 mg by mouth daily.    . melatonin 3 MG TABS tablet Take 3 mg by mouth at bedtime.    . Multiple Vitamin (MULTIVITAMIN WITH MINERALS) TABS tablet Take 1 tablet by mouth daily.    Marland Kitchen NOVOLOG FLEXPEN 100 UNIT/ML FlexPen GIVE EVERY MORNING WITH BREAKFAST AND EVERY EVENING WITH SUPPER PER SLIDING SCALE (Patient taking differently: Inject 2-13 Units into the skin 2 (two) times daily. Sliding Scale : 101-150 - 2u 151-200 - 3u 201-250 - 5u 251-300 - 7u 301-350 - 9u 351- 400 - 11u 401-450 - 13u Greater than 150, alert MD) 3 mL 0  . Omega-3 Fatty Acids (FISH OIL) 500 MG CAPS Take  500 mg by mouth daily.    Marland Kitchen omeprazole (PRILOSEC) 20 MG capsule TAKE 1 CAPSULE DAILY (Patient taking differently: Take 20 mg by mouth daily.) 90 capsule 3  . oxyCODONE (OXY IR/ROXICODONE) 5 MG immediate release tablet Take 5 mg by mouth every 4 (four) hours as needed for severe pain or moderate pain.    . potassium chloride SA (KLOR-CON M20) 20 MEQ tablet Take 1 tablet (20 mEq total) by mouth daily. 90 tablet 3  . rosuvastatin (CRESTOR) 20 MG tablet Take 1 tablet (20 mg total) by mouth daily. 90 tablet 3  . tamsulosin (FLOMAX) 0.4 MG CAPS capsule Take 1 capsule (0.4 mg total) by mouth daily. 30 capsule   . B-D UF III MINI PEN NEEDLES 31G X 5 MM MISC Inject into the skin.    Marland Kitchen docusate sodium (COLACE) 100 MG capsule Take 1 capsule (100 mg total) by mouth 2 (two) times daily. (Patient not taking: Reported on 11/06/2020) 10 capsule 0  . FREESTYLE LITE test strip USE ONE STRIP TO CHECK GLUCOSE ONCE DAILY. PLEASE  SCHEDULE FOLLOW UP 50 each 0  . Insulin Pen Needle 30G X 5 MM MISC Use one daily with insulin 100 each 2  . Lancets (FREESTYLE) lancets USE AS INSTRUCTED TO CHECK BLOOD SUGAR ONCE A DAY 100 each 2    Home: Home Living Family/patient expects to be discharged to:: Private residence Living Arrangements: Spouse/significant other (reports wife) Available Help at Discharge: Family,Available 24 hours/day Type of Home: House Home Access: Stairs to enter [employer] of Steps: (side of house) 1 step then laundry room then 2 steps to main level Entrance Stairs-Rails: None Home Layout: One level Bathroom Shower/Tub: Walk-in Armed forces operational officer: Standard Home Equipment: None Additional Comments: reports bad experience at SNF and refusing to consider transfer to SNF again. pt states It will take me longer but im going home.pt state "ive done more here already then i did the whole time there. no one got me up"  Functional History: Prior Function Level of Independence:  Needs assistance Gait / Transfers Assistance Needed: limited mobility with RW ADL's / Homemaking Assistance Needed: needs assistance Comments: reports could use a WC in the home Functional Status:  Mobility: Bed Mobility Overal bed mobility: Needs Assistance Bed Mobility: Supine to Sit,Rolling Rolling: Total assist,Mod assist Supine to sit: +2 for physical  assistance,Mod assist,HOB elevated (25 degrees) Sit to supine: Mod assist,+2 for physical assistance,+2 for safety/equipment General bed mobility comments: pt requires (A) to faciliate rolling and to elevate from bed suface pt unable to flex R Knee. pt with bil legs lifted off bed to allow pt to come into sitting. pt throughout session with c/o of knee pain Transfers Overall transfer level: Needs assistance Equipment used: 2 person hand held assist Transfers: Sit to/from [employer] Sit to Stand: +2 physical assistance,Mod assist,From elevated surface Stand pivot transfers: +2 physical assistance,Max assist,From elevated surface General transfer comment: ModAx2 to power up to stand from elevated EOB and maxAx2 to come to stand from lower surfaces. Intermittent R knee block due to buckling, requiring extra time and maxAx2 to stand step to L EOB > chair. Elevated seat surface in drop arm chair to allow easier transfer options for RN staff. pt sit<>Stand from bed x2 and from chair x1 to ensure he can go back to bed surface after sititng up. Ambulation/Gait Ambulation/Gait assistance: Max assist,+2 physical assistance Gait Distance (Feet): 3 Feet Assistive device: 2 person hand held assist Gait Pattern/deviations: Decreased step length - right,Decreased step length - left,Decreased stride length,Decreased weight shift to right,Decreased weight shift to left,Shuffle,Trunk flexed General Gait Details: Pt with bil knee and hip flexion, needing repeated cues to extend with intermittent R knee block due to buckling. Several side  steps to L with bil HHA and maxAx2, cuing to shift weight and advance feet. Gait velocity: reduced Gait velocity interpretation: <1.31 ft/sec, indicative of household ambulator    ADL: ADL Overall ADL's : Needs assistance/impaired Eating/Feeding: Set up,Sitting Grooming: Set up,Sitting Upper Body Bathing: Minimal assistance,Set up,Sitting Lower Body Bathing: Maximal assistance,Set up,Bed level Upper Body Dressing : Moderate assistance,Set up,Sitting Lower Body Dressing: Maximal assistance Toilet Transfer: +2 for physical assistance,Maximal assistance Toilet Transfer Details (indicate cue type and reason): unable Toileting- Clothing Manipulation and Hygiene: Total assistance,Bed level General ADL Comments: pt progressed from EOB to chair this session. pt with x4 sit<>Stand this session. pt able to achieve better static standing each attempt  Cognition: Cognition Overall Cognitive Status: Impaired/Different from baseline Orientation Level: Oriented X4 Cognition Arousal/Alertness: Awake/alert Behavior During Therapy: Flat affect Overall Cognitive Status: Impaired/Different from baseline General Comments: pt does not recognize doctor when asked. Dr Dawley reports only meeting him twice but pt unaware of doctor present performed last procedure. Pt states "ives seen so many doctors"   Blood pressure 127/62, pulse 87, temperature 98.6 F (37 C), temperature source Oral, resp. rate (!) 21, height 5\' 10"  (1.778 m), weight 86.3 kg, SpO2 91 %. Physical Exam Vitals and nursing note reviewed.  Constitutional:      General: He is sleeping.     Appearance: Normal appearance.     Comments: Awake, somewhat alert, sitting up in bedside chair, NAD  HENT:     Head: Normocephalic.     Comments: L frontal incision dried blood-  On it- looks OK, smile equal; tongue midline.     Right Ear: External ear normal.     Left Ear: External ear normal.     Nose: Nose normal. No congestion.      Mouth/Throat:     Mouth: Mucous membranes are dry.     Pharynx: Oropharynx is clear. No oropharyngeal exudate.  Eyes:     General:        Right eye: No discharge.        Left eye: No discharge.     Extraocular  Movements: Extraocular movements intact.     Comments: Few beats nystagmus-  Cardiovascular:     Rate and Rhythm: Normal rate and regular rhythm.     Heart sounds: No murmur heard. No gallop.   Pulmonary:     Comments: CTA B/L- no W/R/R- good air movement Abdominal:     Comments: Soft, NT, ND, (+)BS    Genitourinary:    Comments: Foley in place- medium amber urine in bag Musculoskeletal:     Cervical back: Normal range of motion and neck supple.     Comments: 5-/5 B/L in UEs LEs- 4+/5 in HF, KE and DF/PF B/L  Skin:    Comments: L wrist/forearm/1 IV's good.   Neurological:     Mental Status: He is oriented to person, place, and time and easily aroused.     Comments: Able to day, date, year and follow simple motor commands. Internally distracted by headache.   Slowed speech; slow to speech; delayed responses "memorial day" is next holiday [place]vor [place] is Sunday); slightly perseverative Is in [place]-   Psychiatric:     Comments: Delayed responses     Results for orders placed or performed during the hospital encounter of 11/05/20 (from the past 24 hour(s))  Glucose, capillary     Status: Abnormal   Collection Time: 11/10/20 11:10 AM  Result Value Ref Range   Glucose-Capillary 311 (H) 70 - 99 mg/dL  Glucose, capillary     Status: Abnormal   Collection Time: 11/10/20  3:20 PM  Result Value Ref Range   Glucose-Capillary 312 (H) 70 - 99 mg/dL   Comment 1 Notify RN    Comment 2 Document in Chart   Glucose, capillary     Status: Abnormal   Collection Time: 11/10/20  7:23 PM  Result Value Ref Range   Glucose-Capillary 257 (H) 70 - 99 mg/dL  Glucose, capillary     Status: Abnormal   Collection Time: 11/10/20  9:26 PM  Result Value Ref Range   Glucose-Capillary 271  (H) 70 - 99 mg/dL  Glucose, capillary     Status: Abnormal   Collection Time: 11/11/20  7:22 AM  Result Value Ref Range   Glucose-Capillary 113 (H) 70 - 99 mg/dL   No results found.   Assessment/Plan: Diagnosis: SAH/SDH s/p evacuation 1. Does the need for close, 24 hr/day medical supervision in concert with the patient's rehab needs make it unreasonable for this patient to be served in a less intensive setting? Yes 2. Co-Morbidities requiring supervision/potential complications: seizures pre-op, confusion; Pancreatic CA; DM; recent hip fx s/p IM nail.  3. Due to bladder management, bowel management, safety, skin/wound care, disease management, medication administration, pain management and patient education, does the patient require 24 hr/day rehab nursing? Yes 4. Does the patient require coordinated care of a physician, rehab nurse, therapy disciplines of PT, OT and SLP to address physical and functional deficits in the context of the above medical diagnosis(es)? Yes Addressing deficits in the following areas: balance, endurance, locomotion, strength, transferring, bowel/bladder control, bathing, dressing, feeding, grooming, toileting, cognition, speech, language and swallowing 5. Can the patient actively participate in an intensive therapy program of at least 3 hrs of therapy per day at least 5 days per week? Yes 6. The potential for patient to make measurable gains while on inpatient rehab is good 7. Anticipated functional outcomes upon discharge from inpatient rehab are supervision and min assist  with PT, supervision and min assist with OT, supervision and min assist with SLP.  8. Estimated rehab length of stay to reach the above functional goals is: 2-3 weeks 9. Anticipated discharge destination: Home 10. Overall Rehab/Functional Prognosis: good  RECOMMENDATIONS: This patient's condition is appropriate for continued rehabilitative care in the following setting: CIR Patient has agreed to  participate in recommended program. Potentially Note that insurance prior authorization may be required for reimbursement for recommended care.  Comment:  1. Pt is an appropriate candidate for CIR- will submit for admissions.   2. Thank you for this consult.    CHINEDU [name], PA-C 11/11/2020  \  I have personally performed a face to face diagnostic evaluation of this patient and formulated the key components of the plan.  Additionally, I have personally reviewed laboratory data, imaging studies, as well as relevant notes and concur with the physician assistant's documentation above.

## 2020-11-11 NOTE — Progress Notes (Incomplete Revision)
PROGRESS NOTE    FREAD KOTTKE   RJJ:[id number]  DOB: 11/25/1955  DOA: 11/05/2020 PCP: Wenda Low, MD   Brief Narrative:  Colton Bush is a 65 year old male with recurrent pancreatic cancer, diabetes mellitus type 2, chronic diastolic heart failure (grade 1), recent right hip fracture status post IM nail, hypertension and hyperlipidemia. His most recent admission was from 3/21-3/31 for hip fracture complicated by e coli UTI and strep anginosis bacteremia.  The patient presented from [facility] on 4/8 for acute onset confusion and was found to have DKA and sepsis. Subsequently found to have liver abscesses.  4/9> IR aspiration and then drains placed.  On 4/10 an MRI of the brain was obtained due to ongoing confusion and increased lethargy and he was found to have an age-indeterminate mixed left subarachnoid and subdural hematoma measuring 10 mm in thickness.  A head CT was recommended for better characterization.  Also noted were areas of suspected acute ischemia in the left temporal and parietal lobes.  That same night, on the way to the CT of the head, he was noted to have 2 seizure-like episodes and neurology was consulted.  CT head>Left holohemispheric subdural collection measuring 10 mm in thickness with mild mass effect on the left hemisphere and only trace midline shift. This is most consistent with a late subacute or chronic subdural hematoma."   He was loaded with Keppra which resulted in cessation of the seizure-like activity.   Neuro consulted> loaded with Keppra NS phone consult > felt the hematoma was 4-6 wks old and did not feel intervention would help.  4/11> He was transferred to [facility] for a continuous EEG and NS consult.  On 4/12>  formally evaluated by neurosurgery and there was concern that he may have a subdural empyema rather than a hematoma.    - underwent burr hold evacuation > contents "appeared to be mostly chronic blood with some membranes, no  evidence of purulent material."   Subjective: No complaints.     Assessment & Plan:   Principal Problem: Liver abscess status post JP drain x2 Bilateral lower lobe pulmonary infiltrates Subdural hematoma - he was admitted to [facility] on 3/21 when he fell and sustained a hip fx- he developed a fever of 102 degrees- grew e coli from his urine and strep agninosis from 1/4  blood cultures- he was treated with Ceftriaxone and then Amoxil for a total of 7 days  - [facility] imaging of chest abdomen pelvis incidentally found hepatic abscesses and b/l pulmonary, lower lobe infiltrates - 4/8 urine culture > 60,000 e coli - 4/9> IR guided aspiration of liver abscesses and 2 drains places- 2 cultures from hepatic drain growing K pneumoniae & E coli (in one) and e coli and strep anginosis (in other)- all sensitive to Ceftriaxone -ID recommends ceftriaxone (which crosses the blood brain barrier in case he has an infected subdural hematoma) and oral metronidazole for total of 6 weeks -ID plans to follow-up prior to stopping the antibiotics and obtain reimaging of his liver to make a decision on whether to stop antibiotics    Active Problems: Acute toxic encephalopathy -Secondary to infection, possible seizures and subdural hematoma  - speaking better today  Subdural hematoma -Question if this is infected-status post bur holes on 4/12-neurosurgery following- recommending transfer out of ICU and resumption of DVT prophylaxis  Probable seizures -Continue Keppra per neurology -EEG 4/12: Suggestive of cortical dysfunction arising from the temporal region-no seizure or definite epileptiform discharges  Uncontrolled  diabetes mellitus with hyperglycemia-presented with DKA -Hemoglobin A1c on 3/21 was 10.8 -Was noted to be hypoglycemic with CBG of 68 on 4/12 (was n.p.o.) -CBGs have been in the 300s since yesterday -He was taking 10 units of Levemir in the morning and 15 in the evening at home with NovoLog sliding  scale with meals -/12> increased Lantus to 10 units twice daily and Changed sliding scale to 3 times daily with meals moderate dose - continue current doses    Essential hypertension -Amlodipine & benazepril on hold-BP is not elevated    GERD without esophagitis -Continue PPI    Mixed diabetic hyperlipidemia associated with type 2 diabetes mellitus  -Continue statin    Adenocarcinoma of head of pancreas in 2/22  Atrophic pancreas -Status post Whipple's procedure chemo and radiation -His current recurrence is nonresectable radiation was planned but has not been able to be started due to his hip fracture and his current issue - CT shows that his remaining pancreas is atrophic -He is on Creon replacement with meals  BPH with urinary obstruction -Continue Flomax    Recent hip fracture -Status post IM nail -Continue PT OT  -Continue wound care recommendations for below injuries Pressure Injury 11/07/20 Heel Right Stage 1 -  Intact skin with non-blanchable redness of a localized area usually over a bony prominence. (Active)  11/07/20 0800  Location: Heel  Location Orientation: Right  Staging: Stage 1 -  Intact skin with non-blanchable redness of a localized area usually over a bony prominence.  Wound Description (Comments):   Present on Admission:      Pressure Injury 11/07/20 Sacrum Stage 1 -  Intact skin with non-blanchable redness of a localized area usually over a bony prominence. (Active)  11/07/20 2000  Location: Sacrum  Location Orientation:   Staging: Stage 1 -  Intact skin with non-blanchable redness of a localized area usually over a bony prominence.  Wound Description (Comments):   Present on Admission:      Pressure Injury 11/07/20 Heel Right Deep Tissue Pressure Injury - Purple or maroon localized area of discolored intact skin or blood-filled blister due to damage of underlying soft tissue from pressure and/or shear. (Active)  11/07/20 2000  Location: Heel   Location Orientation: Right  Staging: Deep Tissue Pressure Injury - Purple or maroon localized area of discolored intact skin or blood-filled blister due to damage of underlying soft tissue from pressure and/or shear.  Wound Description (Comments):   Present on Admission:      Pressure Injury 11/07/20 Sacrum Deep Tissue Pressure Injury - Purple or maroon localized area of discolored intact skin or blood-filled blister due to damage of underlying soft tissue from pressure and/or shear. (Active)  11/07/20 2000  Location: Sacrum  Location Orientation:   Staging: Deep Tissue Pressure Injury - Purple or maroon localized area of discolored intact skin or blood-filled blister due to damage of underlying soft tissue from pressure and/or shear.  Wound Description (Comments):   Present on Admission:            Time spent in minutes: 30 DVT prophylaxis: Place and maintain sequential compression device Start: 11/09/20 1051  Code Status: Full code Family Communication:  Level of Care: Level of care: ICU Disposition Plan:  Status is: Inpatient  Remains inpatient appropriate because:IV treatments appropriate due to intensity of illness or inability to take PO   Dispo: The patient is from: SNF              Anticipated d/c  is to: CIR recommended by PT              Patient currently is not medically stable to d/c.   Difficult to place patient No      Consultants:   ID  Neuro  NS Procedures:   Burr holes  Abdominal drains Antimicrobials:  Anti-infectives (From admission, onward)   Start     Dose/Rate Route Frequency Ordered Stop   11/09/20 1000  cefTRIAXone (ROCEPHIN) 2 g in sodium chloride 0.9 % 100 mL IVPB        2 g 200 mL/hr over 30 Minutes Intravenous Every 12 hours 11/09/20 0828     11/09/20 0915  metroNIDAZOLE (FLAGYL) tablet 500 mg        500 mg Oral Every 8 hours 11/09/20 0828     11/07/20 2200  Ampicillin-Sulbactam (UNASYN) 3 g in sodium chloride 0.9 % 100 mL  IVPB  Status:  Discontinued        3 g 200 mL/hr over 30 Minutes Intravenous Every 6 hours 11/06/20 2135 11/07/20 0943   11/07/20 1800  Ampicillin-Sulbactam (UNASYN) 3 g in sodium chloride 0.9 % 100 mL IVPB  Status:  Discontinued        3 g 200 mL/hr over 30 Minutes Intravenous Every 6 hours 11/07/20 1219 11/09/20 0828   11/07/20 1600  Ampicillin-Sulbactam (UNASYN) 3 g in sodium chloride 0.9 % 100 mL IVPB  Status:  Discontinued        3 g 200 mL/hr over 30 Minutes Intravenous Every 6 hours 11/07/20 0943 11/07/20 1219   11/07/20 1030  Ampicillin-Sulbactam (UNASYN) 3 g in sodium chloride 0.9 % 100 mL IVPB        3 g 200 mL/hr over 30 Minutes Intravenous  Once 11/07/20 0943 11/07/20 1213   11/06/20 1800  Ampicillin-Sulbactam (UNASYN) 3 g in sodium chloride 0.9 % 100 mL IVPB  Status:  Discontinued        3 g 200 mL/hr over 30 Minutes Intravenous Every 6 hours 11/06/20 1442 11/06/20 2135   11/06/20 1100  cefTRIAXone (ROCEPHIN) 2 g in sodium chloride 0.9 % 100 mL IVPB  Status:  Discontinued        2 g 200 mL/hr over 30 Minutes Intravenous Every 24 hours 11/06/20 0855 11/06/20 1435   11/06/20 0800  vancomycin (VANCOREADY) IVPB 1250 mg/250 mL  Status:  Discontinued        1,250 mg 166.7 mL/hr over 90 Minutes Intravenous Every 12 hours 11/06/20 0643 11/06/20 0854   11/06/20 0700  metroNIDAZOLE (FLAGYL) IVPB 500 mg  Status:  Discontinued        500 mg 100 mL/hr over 60 Minutes Intravenous Every 8 hours 11/06/20 0638 11/06/20 1452   11/06/20 0400  ceFEPIme (MAXIPIME) 2 g in sodium chloride 0.9 % 100 mL IVPB  Status:  Discontinued        2 g 200 mL/hr over 30 Minutes Intravenous Every 8 hours 11/05/20 2358 11/06/20 0853   11/05/20 1845  vancomycin (VANCOREADY) IVPB 1500 mg/300 mL        1,500 mg 150 mL/hr over 120 Minutes Intravenous  Once 11/05/20 1830 11/05/20 2237   11/05/20 1830  ceFEPIme (MAXIPIME) 2 g in sodium chloride 0.9 % 100 mL IVPB        2 g 200 mL/hr over 30 Minutes Intravenous   Once 11/05/20 1824 11/05/20 2103   11/05/20 1830  metroNIDAZOLE (FLAGYL) IVPB 500 mg        500 mg 100  mL/hr over 60 Minutes Intravenous  Once 11/05/20 1824 11/05/20 2140   11/05/20 1830  vancomycin (VANCOCIN) IVPB 1000 mg/200 mL premix  Status:  Discontinued        1,000 mg 200 mL/hr over 60 Minutes Intravenous  Once 11/05/20 1824 11/05/20 1830       Objective: Vitals:   11/11/20 1100 11/11/20 1200 11/11/20 1300 11/11/20 1400  BP: (!) 122/59 117/67 125/65 123/65  Pulse: 71 92 83 78  Resp: 19     Temp:  98.3 F (36.8 C)    TempSrc:  Oral    SpO2: 92% 92% 94% 96%  Weight:      Height:        Intake/Output Summary (Last 24 hours) at 11/11/2020 1429 Last data filed at 11/11/2020 1400 Gross per 24 hour  Intake 812.85 ml  Output 2325 ml  Net -1512.15 ml   Filed Weights   11/06/20 0800 11/06/20 0903 11/08/20 2103  Weight: 79 kg 79 kg 86.3 kg    Examination: General exam: Appears comfortable  HEENT: PERRLA, oral mucosa moist, no sclera icterus or thrush Respiratory system: Clear to auscultation. Respiratory effort normal. Cardiovascular system: S1 & S2 heard, regular rate and rhythm Gastrointestinal system: Abdomen soft, non-tender, nondistended. Normal bowel sounds  - drains noted. Central nervous system: Alert   No focal neurological deficits. Extremities: No cyanosis, clubbing or edema Skin: No rashes or ulcers Psychiatry:  flat affect  Data Reviewed: I have personally reviewed following labs and imaging studies  CBC: Recent Labs  Lab 11/05/20 1930 11/05/20 2007 11/06/20 0337 11/07/20 0124 11/08/20 0237 11/09/20 0132 11/09/20 1251 11/10/20 0705 11/10/20 0829  WBC 21.3*  --  31.0* 22.6* 11.7* 10.3  --  9.6 11.0*  NEUTROABS 20.1*  --  29.4* 20.9* 9.9* 8.5*  --   --   --   HGB 12.5*   < > 11.0* 10.3* 9.9* 9.6* 10.2* 10.0* 10.9*  HCT 39.1   < > 35.6* 31.7* 31.1* 29.4* 30.0* 30.6* 33.5*  MCV 94.7  --  95.4 93.0 93.4 91.6  --  91.9 90.8  PLT 529*  --  635* 470*  410* 386  --  415* 469*   < > = values in this interval not displayed.   Basic Metabolic Panel: Recent Labs  Lab 11/06/20 1424 11/06/20 1820 11/07/20 0124 11/08/20 0237 11/09/20 0132 11/09/20 0801 11/09/20 1251 11/10/20 0705 11/10/20 0829  NA 139   < > 137 136 136 137 140 136 135  K 3.0*   < > 3.2* 2.7* 3.2* 3.0* 3.0* 3.3* 3.6  CL 110   < > 107 104 103 101  --  102 101  CO2 21*   < > 21* 24 27 28   --  30 26  GLUCOSE 139*   < > 169* 79 140* 179*  --  209* 210*  BUN 15   < > 14 10 5* <5*  --  6* 7*  CREATININE 0.57*   < > 0.43* 0.45* 0.44* 0.42*  --  0.42* 0.44*  CALCIUM 8.3*   < > 8.2* 7.8* 7.5* 7.7*  --  8.0* 8.0*  MG 1.7  --  1.9 1.7 1.8 1.9  --   --   --   PHOS 2.4*  --  2.2* 3.0 2.4*  --   --   --   --    < > = values in this interval not displayed.   GFR: Estimated Creatinine Clearance: 92.5 mL/min (A) (by C-G formula  based on SCr of 0.44 mg/dL (L)). Liver Function Tests: Recent Labs  Lab 11/06/20 0337 11/06/20 1424 11/07/20 0124 11/08/20 0237 11/09/20 0132  AST 18 29 45* 50* 27  ALT 22 22 28  40 31  ALKPHOS 303* 252* 230* 258* 234*  BILITOT 1.9* 0.1* 0.8 0.7 0.6  PROT 7.8 6.7 6.2* 6.2* 5.6*  ALBUMIN 2.6* 2.2* 2.1* 2.1* 1.7*   No results for input(s): LIPASE, AMYLASE in the last 168 hours. Recent Labs  Lab 11/07/20 0851  AMMONIA 19   Coagulation Profile: Recent Labs  Lab 11/05/20 1930 11/06/20 0337  INR 1.5* 1.6*   Cardiac Enzymes: No results for input(s): CKTOTAL, CKMB, CKMBINDEX, TROPONINI in the last 168 hours. BNP (last 3 results) No results for input(s): PROBNP in the last 8760 hours. HbA1C: No results for input(s): HGBA1C in the last 72 hours. CBG: Recent Labs  Lab 11/10/20 1520 11/10/20 1923 11/10/20 2126 11/11/20 0722 11/11/20 1114  GLUCAP 312* 257* 271* 113* 228*   Lipid Profile: No results for input(s): CHOL, HDL, LDLCALC, TRIG, CHOLHDL, LDLDIRECT in the last 72 hours. Thyroid Function Tests: No results for input(s): TSH,  T4TOTAL, FREET4, T3FREE, THYROIDAB in the last 72 hours. Anemia Panel: Recent Labs    11/09/20 0132  VITAMINB12 464  FOLATE 15.4  FERRITIN 354*  TIBC 154*  IRON 21*  RETICCTPCT 1.1   Urine analysis:    Component Value Date/Time   COLORURINE YELLOW 11/05/2020 2038   APPEARANCEUR HAZY (A) 11/05/2020 2038   LABSPEC 1.027 11/05/2020 2038   PHURINE 5.0 11/05/2020 2038   GLUCOSEU >=500 (A) 11/05/2020 2038   HGBUR MODERATE (A) 11/05/2020 2038   BILIRUBINUR NEGATIVE 11/05/2020 2038   BILIRUBINUR 1+ 02/23/2016 1256   KETONESUR 80 (A) 11/05/2020 2038   PROTEINUR 30 (A) 11/05/2020 2038   UROBILINOGEN 1.0 02/23/2016 1256   NITRITE NEGATIVE 11/05/2020 2038   LEUKOCYTESUR TRACE (A) 11/05/2020 2038   Sepsis Labs: @LABRCNTIP (procalcitonin:4,lacticidven:4) ) Recent Results (from the past 240 hour(s))  Resp Panel by RT-PCR (Flu A&B, Covid) Nasopharyngeal Swab     Status: None   Collection Time: 11/05/20  7:14 PM   Specimen: Nasopharyngeal Swab; Nasopharyngeal(NP) swabs in vial transport medium  Result Value Ref Range Status   SARS Coronavirus 2 by RT PCR NEGATIVE NEGATIVE Final    Comment: (NOTE) SARS-CoV-2 target nucleic acids are NOT DETECTED.  The SARS-CoV-2 RNA is generally detectable in upper respiratory specimens during the acute phase of infection. The lowest concentration of SARS-CoV-2 viral copies this assay can detect is 138 copies/mL. A negative result does not preclude SARS-Cov-2 infection and should not be used as the sole basis for treatment or other patient management decisions. A negative result may occur with  improper specimen collection/handling, submission of specimen other than nasopharyngeal swab, presence of viral mutation(s) within the areas targeted by this assay, and inadequate number of viral copies(<138 copies/mL). A negative result must be combined with clinical observations, patient history, and epidemiological information. The expected result is  Negative.  Fact Sheet for Patients:  EntrepreneurPulse.com.au  Fact Sheet for Healthcare Providers:  IncredibleEmployment.be  This test is no t yet approved or cleared by the [place] FDA and  has been authorized for detection and/or diagnosis of SARS-CoV-2 by FDA under an Emergency Use Authorization (EUA). This EUA will remain  in effect (meaning this test can be used) for the duration of the COVID-19 declaration under Section 564(b)(1) of the Act, 21 U.S.C.section 360bbb-3(b)(1), unless the authorization is terminated  or revoked sooner.  Influenza A by PCR NEGATIVE NEGATIVE Final   Influenza B by PCR NEGATIVE NEGATIVE Final    Comment: (NOTE) The Xpert Xpress SARS-CoV-2/FLU/RSV plus assay is intended as an aid in the diagnosis of influenza from Nasopharyngeal swab specimens and should not be used as a sole basis for treatment. Nasal washings and aspirates are unacceptable for Xpert Xpress SARS-CoV-2/FLU/RSV testing.  Fact Sheet for Patients: EntrepreneurPulse.com.au  Fact Sheet for Healthcare Providers: IncredibleEmployment.be  This test is not yet approved or cleared by the [place] FDA and has been authorized for detection and/or diagnosis of SARS-CoV-2 by FDA under an Emergency Use Authorization (EUA). This EUA will remain in effect (meaning this test can be used) for the duration of the COVID-19 declaration under Section 564(b)(1) of the Act, 21 U.S.C. section 360bbb-3(b)(1), unless the authorization is terminated or revoked.  Performed at [facility], [place] [address] [place], [place] [address]   Blood Culture (routine x 2)     Status: None   Collection Time: 11/05/20  8:36 PM   Specimen: BLOOD  Result Value Ref Range Status   Specimen Description   Final    BLOOD BLOOD RIGHT FOREARM Performed at [place] [address]  [place], [place] [address]    Special Requests   Final    BOTTLES DRAWN AEROBIC AND ANAEROBIC Blood Culture results may not be optimal due to an inadequate volume of blood received in culture bottles Performed at [place] [address] [place], [place] [address]    Culture   Final    NO GROWTH 5 DAYS Performed at [facility], [place] [address] [place], [place] [address]    Report Status 11/11/2020 FINAL  Final  Blood Culture (routine x 2)     Status: None   Collection Time: 11/05/20  8:38 PM   Specimen: BLOOD  Result Value Ref Range Status   Specimen Description   Final    BLOOD Performed at [place] [address] [place], [place] [address]    Special Requests   Final    BOTTLES DRAWN AEROBIC AND ANAEROBIC Blood Culture results may not be optimal due to an inadequate volume of blood received in culture bottles Performed at [place] [address], [place], [place] [address]    Culture   Final    NO GROWTH 5 DAYS Performed at [facility], [place] [address] [place], [place] [address]    Report Status 11/11/2020 FINAL  Final  Urine culture     Status: Abnormal   Collection Time: 11/05/20  8:38 PM   Specimen: In/Out Cath Urine  Result Value Ref Range Status   Specimen Description   Final    IN/OUT CATH URINE Performed at [facility] [employer] [facility] [address], [place], [place] [address]    Special Requests   Final    NONE Performed at [facility], [place] [address], [place], [place] [address]    Culture 60,000 COLONIES/mL ESCHERICHIA COLI (A)  Final   Report Status 11/08/2020 FINAL  Final   Organism ID, Bacteria ESCHERICHIA COLI (A)  Final      Susceptibility   Escherichia coli - MIC*    AMPICILLIN 4 SENSITIVE Sensitive     CEFAZOLIN <=4 SENSITIVE Sensitive     CEFEPIME <=0.12 SENSITIVE Sensitive     CEFTRIAXONE <=0.25 SENSITIVE Sensitive     CIPROFLOXACIN <=0.25  SENSITIVE Sensitive  GENTAMICIN <=1 SENSITIVE Sensitive     IMIPENEM <=0.25 SENSITIVE Sensitive     NITROFURANTOIN <=16 SENSITIVE Sensitive     TRIMETH/SULFA <=20 SENSITIVE Sensitive     AMPICILLIN/SULBACTAM <=2 SENSITIVE Sensitive     PIP/TAZO <=4 SENSITIVE Sensitive     * 60,000 COLONIES/mL ESCHERICHIA COLI  MRSA PCR Screening     Status: None   Collection Time: 11/06/20  8:42 AM   Specimen: Nasopharyngeal  Result Value Ref Range Status   MRSA by PCR NEGATIVE NEGATIVE Final    Comment:        The GeneXpert MRSA Assay (FDA approved for NASAL specimens only), is one component of a comprehensive MRSA colonization surveillance program. It is not intended to diagnose MRSA infection nor to guide or monitor treatment for MRSA infections. Performed at [facility], [place] [address], [place], [place] [address]   Aerobic/Anaerobic Culture (surgical/deep wound)     Status: None (Preliminary result)   Collection Time: 11/06/20  3:45 PM   Specimen: Liver; Abscess  Result Value Ref Range Status   Specimen Description   Final    LIVER Performed at [place] [address], [place], [place] [address]    Special Requests   Final    NONE Performed at [facility], [place] [address] [place], [place] [address]    Gram Stain   Final    FEW GRAM VARIABLE ROD Performed at [facility], [place] [address], [place], [place] [address]    Culture   Final    ABUNDANT KLEBSIELLA PNEUMONIAE ABUNDANT ESCHERICHIA COLI NO ANAEROBES ISOLATED; CULTURE IN PROGRESS FOR 5 DAYS    Report Status PENDING  Incomplete   Organism ID, Bacteria KLEBSIELLA PNEUMONIAE  Final   Organism ID, Bacteria ESCHERICHIA COLI  Final      Susceptibility   Escherichia coli - MIC*    AMPICILLIN <=2 SENSITIVE Sensitive     CEFAZOLIN <=4 SENSITIVE Sensitive     CEFEPIME <=0.12 SENSITIVE Sensitive     CEFTAZIDIME <=1 SENSITIVE Sensitive     CEFTRIAXONE <=0.25  SENSITIVE Sensitive     CIPROFLOXACIN <=0.25 SENSITIVE Sensitive     GENTAMICIN <=1 SENSITIVE Sensitive     IMIPENEM <=0.25 SENSITIVE Sensitive     TRIMETH/SULFA <=20 SENSITIVE Sensitive     AMPICILLIN/SULBACTAM <=2 SENSITIVE Sensitive     PIP/TAZO <=4 SENSITIVE Sensitive     * ABUNDANT ESCHERICHIA COLI   Klebsiella pneumoniae - MIC*    AMPICILLIN >=32 RESISTANT Resistant     CEFAZOLIN <=4 SENSITIVE Sensitive     CEFEPIME <=0.12 SENSITIVE Sensitive     CEFTAZIDIME <=1 SENSITIVE Sensitive     CEFTRIAXONE <=0.25 SENSITIVE Sensitive     CIPROFLOXACIN <=0.25 SENSITIVE Sensitive     GENTAMICIN <=1 SENSITIVE Sensitive     IMIPENEM <=0.25 SENSITIVE Sensitive     TRIMETH/SULFA <=20 SENSITIVE Sensitive     AMPICILLIN/SULBACTAM 16 INTERMEDIATE Intermediate     PIP/TAZO 16 SENSITIVE Sensitive     * ABUNDANT KLEBSIELLA PNEUMONIAE  Aerobic/Anaerobic Culture w Gram Stain (surgical/deep wound)     Status: None (Preliminary result)   Collection Time: 11/06/20  3:46 PM   Specimen: Liver; Abscess  Result Value Ref Range Status   Specimen Description LIVER ABSCESS  Final   Special Requests MORE INFERIOR RIGHT LIVER  Final   Gram Stain   Final    FEW WBC PRESENT, PREDOMINANTLY PMN FEW GRAM NEGATIVE RODS RARE GRAM POSITIVE COCCI IN PAIRS Performed at  [facility], [place] [address] [place], [place] [address]    Culture   Final    ABUNDANT ESCHERICHIA COLI ABUNDANT STREPTOCOCCUS ANGINOSIS NO ANAEROBES ISOLATED; CULTURE IN PROGRESS FOR 5 DAYS    Report Status PENDING  Incomplete   Organism ID, Bacteria ESCHERICHIA COLI  Final   Organism ID, Bacteria STREPTOCOCCUS ANGINOSIS  Final      Susceptibility   Escherichia coli - MIC*    AMPICILLIN 4 SENSITIVE Sensitive     CEFAZOLIN <=4 SENSITIVE Sensitive     CEFEPIME <=0.12 SENSITIVE Sensitive     CEFTAZIDIME <=1 SENSITIVE Sensitive     CEFTRIAXONE <=0.25 SENSITIVE Sensitive     CIPROFLOXACIN <=0.25 SENSITIVE Sensitive     GENTAMICIN <=1  SENSITIVE Sensitive     IMIPENEM <=0.25 SENSITIVE Sensitive     TRIMETH/SULFA <=20 SENSITIVE Sensitive     AMPICILLIN/SULBACTAM <=2 SENSITIVE Sensitive     PIP/TAZO <=4 SENSITIVE Sensitive     * ABUNDANT ESCHERICHIA COLI   Streptococcus anginosis - MIC*    PENICILLIN <=0.06 SENSITIVE Sensitive     CEFTRIAXONE 0.5 SENSITIVE Sensitive     ERYTHROMYCIN <=0.12 SENSITIVE Sensitive     LEVOFLOXACIN 0.5 SENSITIVE Sensitive     VANCOMYCIN 1 SENSITIVE Sensitive     * ABUNDANT STREPTOCOCCUS ANGINOSIS         Radiology Studies: No results found.    Scheduled Meds: . Chlorhexidine Gluconate Cloth  6 each Topical Daily  . insulin aspart  0-15 Units Subcutaneous TID WC  . insulin aspart  0-5 Units Subcutaneous QHS  . insulin detemir  10 Units Subcutaneous BID  . ipratropium  0.5 mg Nebulization BID  . levalbuterol  0.63 mg Nebulization BID  . levETIRAcetam  1,000 mg Oral BID  . lipase/protease/amylase  12,000 Units Oral TID with meals  . loratadine  10 mg Oral Daily  . mouth rinse  15 mL Mouth Rinse BID  . metroNIDAZOLE  500 mg Oral Q8H  . pantoprazole  40 mg Oral Q1200  . sodium chloride flush  5 mL Intracatheter Q8H  . tamsulosin  0.4 mg Oral Daily   Continuous Infusions: . sodium chloride 10 mL/hr at 11/11/20 1400  . cefTRIAXone (ROCEPHIN)  IV Stopped (11/11/20 1008)     LOS: 5 days      Debbe Odea, MD [facility] Pager: [email or web address] 11/11/2020, 2:29 PM

## 2020-11-11 NOTE — Progress Notes (Addendum)
Subjective:  No new complaints Antibiotics:  Anti-infectives (From admission, onward)   Start     Dose/Rate Route Frequency Ordered Stop   11/09/20 1000  cefTRIAXone (ROCEPHIN) 2 g in sodium chloride 0.9 % 100 mL IVPB        2 g 200 mL/hr over 30 Minutes Intravenous Every 12 hours 11/09/20 0828     11/09/20 0915  metroNIDAZOLE (FLAGYL) tablet 500 mg        500 mg Oral Every 8 hours 11/09/20 0828     11/07/20 2200  Ampicillin-Sulbactam (UNASYN) 3 g in sodium chloride 0.9 % 100 mL IVPB  Status:  Discontinued        3 g 200 mL/hr over 30 Minutes Intravenous Every 6 hours 11/06/20 2135 11/07/20 0943   11/07/20 1800  Ampicillin-Sulbactam (UNASYN) 3 g in sodium chloride 0.9 % 100 mL IVPB  Status:  Discontinued        3 g 200 mL/hr over 30 Minutes Intravenous Every 6 hours 11/07/20 1219 11/09/20 0828   11/07/20 1600  Ampicillin-Sulbactam (UNASYN) 3 g in sodium chloride 0.9 % 100 mL IVPB  Status:  Discontinued        3 g 200 mL/hr over 30 Minutes Intravenous Every 6 hours 11/07/20 0943 11/07/20 1219   11/07/20 1030  Ampicillin-Sulbactam (UNASYN) 3 g in sodium chloride 0.9 % 100 mL IVPB        3 g 200 mL/hr over 30 Minutes Intravenous  Once 11/07/20 0943 11/07/20 1213   11/06/20 1800  Ampicillin-Sulbactam (UNASYN) 3 g in sodium chloride 0.9 % 100 mL IVPB  Status:  Discontinued        3 g 200 mL/hr over 30 Minutes Intravenous Every 6 hours 11/06/20 1442 11/06/20 2135   11/06/20 1100  cefTRIAXone (ROCEPHIN) 2 g in sodium chloride 0.9 % 100 mL IVPB  Status:  Discontinued        2 g 200 mL/hr over 30 Minutes Intravenous Every 24 hours 11/06/20 0855 11/06/20 1435   11/06/20 0800  vancomycin (VANCOREADY) IVPB 1250 mg/250 mL  Status:  Discontinued        1,250 mg 166.7 mL/hr over 90 Minutes Intravenous Every 12 hours 11/06/20 0643 11/06/20 0854   11/06/20 0700  metroNIDAZOLE (FLAGYL) IVPB 500 mg  Status:  Discontinued        500 mg 100 mL/hr over 60 Minutes Intravenous Every 8 hours  11/06/20 0638 11/06/20 1452   11/06/20 0400  ceFEPIme (MAXIPIME) 2 g in sodium chloride 0.9 % 100 mL IVPB  Status:  Discontinued        2 g 200 mL/hr over 30 Minutes Intravenous Every 8 hours 11/05/20 2358 11/06/20 0853   11/05/20 1845  vancomycin (VANCOREADY) IVPB 1500 mg/300 mL        1,500 mg 150 mL/hr over 120 Minutes Intravenous  Once 11/05/20 1830 11/05/20 2237   11/05/20 1830  ceFEPIme (MAXIPIME) 2 g in sodium chloride 0.9 % 100 mL IVPB        2 g 200 mL/hr over 30 Minutes Intravenous  Once 11/05/20 1824 11/05/20 2103   11/05/20 1830  metroNIDAZOLE (FLAGYL) IVPB 500 mg        500 mg 100 mL/hr over 60 Minutes Intravenous  Once 11/05/20 1824 11/05/20 2140   11/05/20 1830  vancomycin (VANCOCIN) IVPB 1000 mg/200 mL premix  Status:  Discontinued        1,000 mg 200 mL/hr over 60 Minutes Intravenous  Once 11/05/20  1824 11/05/20 1830      Medications: Scheduled Meds: . Chlorhexidine Gluconate Cloth  6 each Topical Daily  . enoxaparin (LOVENOX) injection  40 mg Subcutaneous Daily  . insulin aspart  0-15 Units Subcutaneous TID WC  . insulin aspart  0-5 Units Subcutaneous QHS  . insulin detemir  10 Units Subcutaneous BID  . ipratropium  0.5 mg Nebulization BID  . levalbuterol  0.63 mg Nebulization BID  . levETIRAcetam  1,000 mg Oral BID  . lipase/protease/amylase  12,000 Units Oral TID with meals  . loratadine  10 mg Oral Daily  . mouth rinse  15 mL Mouth Rinse BID  . metroNIDAZOLE  500 mg Oral Q8H  . pantoprazole  40 mg Oral Q1200  . sodium chloride flush  5 mL Intracatheter Q8H  . tamsulosin  0.4 mg Oral Daily   Continuous Infusions: . sodium chloride 10 mL/hr at 11/11/20 1500  . cefTRIAXone (ROCEPHIN)  IV Stopped (11/11/20 1008)   PRN Meds:.acetaminophen **OR** acetaminophen, dextrose, hydrALAZINE, ondansetron **OR** ondansetron (ZOFRAN) IV, polyethylene glycol    Objective: Weight change:   Intake/Output Summary (Last 24 hours) at 11/11/2020 1515 Last data filed at  11/11/2020 1500 Gross per 24 hour  Intake 822.84 ml  Output 2325 ml  Net -1502.16 ml   Blood pressure 131/66, pulse 75, temperature 98.3 F (36.8 C), temperature source Oral, resp. rate 19, height 5\' 10"  (1.778 m), weight 86.3 kg, SpO2 93 %. Temp:  [97.7 F (36.5 C)-98.7 F (37.1 C)] 98.3 F (36.8 C) (04/14 1200) Pulse Rate:  [61-93] 75 (04/14 1500) Resp:  [13-22] 19 (04/14 1100) BP: (101-140)/(54-101) 131/66 (04/14 1500) SpO2:  [79 %-100 %] 93 % (04/14 1500)  Physical Exam: Physical Exam Constitutional:      Appearance: He is well-developed.  HENT:     Head: Normocephalic and atraumatic.  Eyes:     Conjunctiva/sclera: Conjunctivae normal.  Cardiovascular:     Rate and Rhythm: Normal rate and regular rhythm.     Heart sounds: No murmur heard. No friction rub. No gallop.   Pulmonary:     Effort: Pulmonary effort is normal. No respiratory distress.     Breath sounds: No stridor. No wheezing or rhonchi.  Abdominal:     General: There is no distension.     Palpations: Abdomen is soft.     Tenderness: There is no abdominal tenderness. There is no guarding.  Musculoskeletal:        General: Normal range of motion.     Cervical back: Normal range of motion and neck supple.  Skin:    General: Skin is warm and dry.     Findings: No erythema or rash.  Neurological:     General: No focal deficit present.     Mental Status: He is alert and oriented to person, place, and time.  Psychiatric:        Mood and Affect: Mood normal.        Speech: Speech is delayed.        Behavior: Behavior normal.        Thought Content: Thought content normal.     JP drains x 2    Burr hole site is sutured  CBC:    BMET Recent Labs    11/10/20 0705 11/10/20 0829  NA 136 135  K 3.3* 3.6  CL 102 101  CO2 30 26  GLUCOSE 209* 210*  BUN 6* 7*  CREATININE 0.42* 0.44*  CALCIUM 8.0* 8.0*  Liver Panel  Recent Labs    11/09/20 0132  PROT 5.6*  ALBUMIN 1.7*  AST 27  ALT 31   ALKPHOS 234*  BILITOT 0.6       Sedimentation Rate No results for input(s): ESRSEDRATE in the last 72 hours. C-Reactive Protein No results for input(s): CRP in the last 72 hours.  Micro Results: Recent Results (from the past 720 hour(s))  Resp Panel by RT-PCR (Flu A&B, Covid) Nasopharyngeal Swab     Status: None   Collection Time: 10/18/20  7:11 PM   Specimen: Nasopharyngeal Swab; Nasopharyngeal(NP) swabs in vial transport medium  Result Value Ref Range Status   SARS Coronavirus 2 by RT PCR NEGATIVE NEGATIVE Final    Comment: (NOTE) SARS-CoV-2 target nucleic acids are NOT DETECTED.  The SARS-CoV-2 RNA is generally detectable in upper respiratory specimens during the acute phase of infection. The lowest concentration of SARS-CoV-2 viral copies this assay can detect is 138 copies/mL. A negative result does not preclude SARS-Cov-2 infection and should not be used as the sole basis for treatment or other patient management decisions. A negative result may occur with  improper specimen collection/handling, submission of specimen other than nasopharyngeal swab, presence of viral mutation(s) within the areas targeted by this assay, and inadequate number of viral copies(<138 copies/mL). A negative result must be combined with clinical observations, patient history, and epidemiological information. The expected result is Negative.  Fact Sheet for Patients:  EntrepreneurPulse.com.au  Fact Sheet for Healthcare Providers:  IncredibleEmployment.be  This test is no t yet approved or cleared by the [place] FDA and  has been authorized for detection and/or diagnosis of SARS-CoV-2 by FDA under an Emergency Use Authorization (EUA). This EUA will remain  in effect (meaning this test can be used) for the duration of the COVID-19 declaration under Section 564(b)(1) of the Act, 21 U.S.C.section 360bbb-3(b)(1), unless the authorization is terminated   or revoked sooner.       Influenza A by PCR NEGATIVE NEGATIVE Final   Influenza B by PCR NEGATIVE NEGATIVE Final    Comment: (NOTE) The Xpert Xpress SARS-CoV-2/FLU/RSV plus assay is intended as an aid in the diagnosis of influenza from Nasopharyngeal swab specimens and should not be used as a sole basis for treatment. Nasal washings and aspirates are unacceptable for Xpert Xpress SARS-CoV-2/FLU/RSV testing.  Fact Sheet for Patients: EntrepreneurPulse.com.au  Fact Sheet for Healthcare Providers: IncredibleEmployment.be  This test is not yet approved or cleared by the [place] FDA and has been authorized for detection and/or diagnosis of SARS-CoV-2 by FDA under an Emergency Use Authorization (EUA). This EUA will remain in effect (meaning this test can be used) for the duration of the COVID-19 declaration under Section 564(b)(1) of the Act, 21 U.S.C. section 360bbb-3(b)(1), unless the authorization is terminated or revoked.  Performed at [facility], [place] [address] [place], [place] [address]   Surgical pcr screen     Status: Abnormal   Collection Time: 10/19/20 12:47 AM   Specimen: Nasal Mucosa; Nasal Swab  Result Value Ref Range Status   MRSA, PCR NEGATIVE NEGATIVE Final   Staphylococcus aureus POSITIVE (A) NEGATIVE Final    Comment: RESULT CALLED TO, READ BACK BY AND VERIFIED WITH: BOBBY, RN @ [phone] ON 10/19/20 C VARNER (NOTE) The Xpert SA Assay (FDA approved for NASAL specimens in patients 75 years of age and older), is one component of a comprehensive surveillance program. It is not intended to diagnose infection nor to guide or monitor treatment.  Performed at [facility], [place] [address] [place], [place] [address]   Culture, blood (routine x 2)     Status: None   Collection Time: 10/20/20  7:55 AM   Specimen: BLOOD  Result Value Ref Range Status   Specimen Description   Final    BLOOD  RIGHT ANTECUBITAL Performed at [place] [address] [place], [place] [address]    Special Requests   Final    BOTTLES DRAWN AEROBIC AND ANAEROBIC Blood Culture results may not be optimal due to an inadequate volume of blood received in culture bottles Performed at [place] [address], [place], [place] [address]    Culture   Final    NO GROWTH 5 DAYS Performed at [name], [place] [address] [place], [place] [address]    Report Status 10/25/2020 FINAL  Final  Culture, blood (routine x 2)     Status: Abnormal   Collection Time: 10/20/20  7:56 AM   Specimen: BLOOD  Result Value Ref Range Status   Specimen Description   Final    BLOOD RIGHT ANTECUBITAL Performed at [place] [address], [place], [place] [address]    Special Requests   Final    BOTTLES DRAWN AEROBIC AND ANAEROBIC Blood Culture results may not be optimal due to an inadequate volume of blood received in culture bottles Performed at [place] [address] [place], [place] [address]    Culture  Setup Time   Final    AEROBIC BOTTLE ONLY GRAM POSITIVE COCCI CRITICAL RESULT CALLED TO, READ BACK BY AND VERIFIED WITHMelodye Ped [facility] 7494 10/21/20 A BROWNING Performed at [facility], [place] [address], [place], [place] [address]    Culture STREPTOCOCCUS ANGINOSIS (A)  Final   Report Status 10/24/2020 FINAL  Final   Organism ID, Bacteria STREPTOCOCCUS ANGINOSIS  Final      Susceptibility   Streptococcus anginosis - MIC*    PENICILLIN <=0.06 SENSITIVE Sensitive     CEFTRIAXONE 0.5 SENSITIVE Sensitive     ERYTHROMYCIN <=0.12 SENSITIVE Sensitive     LEVOFLOXACIN 0.5 SENSITIVE Sensitive     VANCOMYCIN 1 SENSITIVE Sensitive     * STREPTOCOCCUS ANGINOSIS  Blood Culture ID Panel (Reflexed)     Status: Abnormal   Collection Time: 10/20/20  7:56 AM  Result Value Ref Range Status   Enterococcus faecalis NOT  DETECTED NOT DETECTED Final   Enterococcus Faecium NOT DETECTED NOT DETECTED Final   Listeria monocytogenes NOT DETECTED NOT DETECTED Final   Staphylococcus species NOT DETECTED NOT DETECTED Final   Staphylococcus aureus (BCID) NOT DETECTED NOT DETECTED Final   Staphylococcus epidermidis NOT DETECTED NOT DETECTED Final   Staphylococcus lugdunensis NOT DETECTED NOT DETECTED Final   Streptococcus species DETECTED (A) NOT DETECTED Final    Comment: Not Enterococcus species, Streptococcus agalactiae, Streptococcus pyogenes, or Streptococcus pneumoniae. CRITICAL RESULT CALLED TO, READ BACK BY AND VERIFIED WITH: Melodye Ped PHARMD [id number] 10/21/20 A BROWNING    Streptococcus agalactiae NOT DETECTED NOT DETECTED Final   Streptococcus pneumoniae NOT DETECTED NOT DETECTED Final   Streptococcus pyogenes NOT DETECTED NOT DETECTED Final   A.calcoaceticus-baumannii NOT DETECTED NOT DETECTED Final   Bacteroides fragilis NOT DETECTED NOT DETECTED Final   Enterobacterales NOT DETECTED NOT DETECTED Final   Enterobacter cloacae complex NOT DETECTED NOT DETECTED Final   Escherichia coli NOT DETECTED NOT DETECTED Final   Klebsiella aerogenes NOT DETECTED NOT  DETECTED Final   Klebsiella oxytoca NOT DETECTED NOT DETECTED Final   Klebsiella pneumoniae NOT DETECTED NOT DETECTED Final   Proteus species NOT DETECTED NOT DETECTED Final   Salmonella species NOT DETECTED NOT DETECTED Final   Serratia marcescens NOT DETECTED NOT DETECTED Final   Haemophilus influenzae NOT DETECTED NOT DETECTED Final   Neisseria meningitidis NOT DETECTED NOT DETECTED Final   Pseudomonas aeruginosa NOT DETECTED NOT DETECTED Final   Stenotrophomonas maltophilia NOT DETECTED NOT DETECTED Final   Candida albicans NOT DETECTED NOT DETECTED Final   Candida auris NOT DETECTED NOT DETECTED Final   Candida glabrata NOT DETECTED NOT DETECTED Final   Candida krusei NOT DETECTED NOT DETECTED Final   Candida parapsilosis NOT DETECTED NOT DETECTED  Final   Candida tropicalis NOT DETECTED NOT DETECTED Final   Cryptococcus neoformans/gattii NOT DETECTED NOT DETECTED Final    Comment: Performed at [facility], [place] [address], [place], [place] [address]  Culture, Urine     Status: Abnormal   Collection Time: 10/20/20 10:25 AM   Specimen: Urine, Clean Catch  Result Value Ref Range Status   Specimen Description   Final    URINE, CLEAN CATCH Performed at [facility], [place] [address], [place], [place] [address]    Special Requests   Final    NONE Performed at [facility], [place] [address], [place], [place] [address]    Culture >=100,000 COLONIES/mL ESCHERICHIA COLI (A)  Final   Report Status 10/23/2020 FINAL  Final   Organism ID, Bacteria ESCHERICHIA COLI (A)  Final      Susceptibility   Escherichia coli - MIC*    AMPICILLIN 4 SENSITIVE Sensitive     CEFAZOLIN <=4 SENSITIVE Sensitive     CEFEPIME <=0.12 SENSITIVE Sensitive     CEFTRIAXONE <=0.25 SENSITIVE Sensitive     CIPROFLOXACIN <=0.25 SENSITIVE Sensitive     GENTAMICIN <=1 SENSITIVE Sensitive     IMIPENEM <=0.25 SENSITIVE Sensitive     NITROFURANTOIN <=16 SENSITIVE Sensitive     TRIMETH/SULFA <=20 SENSITIVE Sensitive     AMPICILLIN/SULBACTAM <=2 SENSITIVE Sensitive     PIP/TAZO <=4 SENSITIVE Sensitive     * >=100,000 COLONIES/mL ESCHERICHIA COLI  Culture, blood (routine x 2)     Status: None   Collection Time: 10/22/20  7:26 AM   Specimen: BLOOD  Result Value Ref Range Status   Specimen Description   Final    BLOOD LEFT ANTECUBITAL Performed at [place] [address] [place], [place] [address]    Special Requests   Final    BOTTLES DRAWN AEROBIC ONLY Blood Culture results may not be optimal due to an inadequate volume of blood received in culture bottles Performed at [place] [address], [place], [place] [address]    Culture   Final    NO GROWTH 5 DAYS Performed at [facility], [place] [address], [place], [place] [address]    Report Status 10/27/2020 FINAL  Final  Culture, blood (routine x 2)     Status: None   Collection Time: 10/22/20  7:26 AM   Specimen: BLOOD  Result Value Ref Range Status   Specimen Description   Final    BLOOD LEFT ANTECUBITAL Performed at [place] [address], [place], [place] [address]    Special Requests   Final    BOTTLES DRAWN AEROBIC ONLY Blood Culture results may not be optimal due to an  inadequate volume of blood received in culture bottles Performed at [place] [address], [place], [place] [address]    Culture   Final    NO GROWTH 5 DAYS Performed at [facility], [place] [address], [place], [place] [address]    Report Status 10/27/2020 FINAL  Final  SARS CORONAVIRUS 2 (TAT 6-24 HRS) Nasopharyngeal Nasopharyngeal Swab     Status: None   Collection Time: 10/27/20  1:19 PM   Specimen: Nasopharyngeal Swab  Result Value Ref Range Status   SARS Coronavirus 2 NEGATIVE NEGATIVE Final    Comment: (NOTE) SARS-CoV-2 target nucleic acids are NOT DETECTED.  The SARS-CoV-2 RNA is generally detectable in upper and lower respiratory specimens during the acute phase of infection. Negative results do not preclude SARS-CoV-2 infection, do not rule out co-infections with other pathogens, and should not be used as the sole basis for treatment or other patient management decisions. Negative results must be combined with clinical observations, patient history, and epidemiological information. The expected result is Negative.  Fact Sheet for Patients: SugarRoll.be  Fact Sheet for Healthcare Providers: https://www.woods-mathews.com/  This test is not yet approved or cleared by the [place] FDA and  has been authorized for detection and/or diagnosis of SARS-CoV-2 by FDA under an Emergency Use Authorization (EUA). This EUA will  remain  in effect (meaning this test can be used) for the duration of the COVID-19 declaration under Se ction 564(b)(1) of the Act, 21 U.S.C. section 360bbb-3(b)(1), unless the authorization is terminated or revoked sooner.  Performed at [facility], [place] [address] [place],  [address]   Resp Panel by RT-PCR (Flu A&B, Covid) Nasopharyngeal Swab     Status: None   Collection Time: 11/05/20  7:14 PM   Specimen: Nasopharyngeal Swab; Nasopharyngeal(NP) swabs in vial transport medium  Result Value Ref Range Status   SARS Coronavirus 2 by RT PCR NEGATIVE NEGATIVE Final    Comment: (NOTE) SARS-CoV-2 target nucleic acids are NOT DETECTED.  The SARS-CoV-2 RNA is generally detectable in upper respiratory specimens during the acute phase of infection. The lowest concentration of SARS-CoV-2 viral copies this assay can detect is 138 copies/mL. A negative result does not preclude SARS-Cov-2 infection and should not be used as the sole basis for treatment or other patient management decisions. A negative result may occur with  improper specimen collection/handling, submission of specimen other than nasopharyngeal swab, presence of viral mutation(s) within the areas targeted by this assay, and inadequate number of viral copies(<138 copies/mL). A negative result must be combined with clinical observations, patient history, and epidemiological information. The expected result is Negative.  Fact Sheet for Patients:  EntrepreneurPulse.com.au  Fact Sheet for Healthcare Providers:  IncredibleEmployment.be  This test is no t yet approved or cleared by the [place] FDA and  has been authorized for detection and/or diagnosis of SARS-CoV-2 by FDA under an Emergency Use Authorization (EUA). This EUA will remain  in effect (meaning this test can be used) for the duration of the COVID-19 declaration under Section 564(b)(1) of the Act, 21 U.S.C.section  360bbb-3(b)(1), unless the authorization is terminated  or revoked sooner.       Influenza A by PCR NEGATIVE NEGATIVE Final   Influenza B by PCR NEGATIVE NEGATIVE Final    Comment: (NOTE) The Xpert Xpress SARS-CoV-2/FLU/RSV plus assay is intended as an aid in the diagnosis of influenza from Nasopharyngeal swab specimens and should not be used as a sole basis for treatment.  Nasal washings and aspirates are unacceptable for Xpert Xpress SARS-CoV-2/FLU/RSV testing.  Fact Sheet for Patients: EntrepreneurPulse.com.au  Fact Sheet for Healthcare Providers: IncredibleEmployment.be  This test is not yet approved or cleared by the [place] FDA and has been authorized for detection and/or diagnosis of SARS-CoV-2 by FDA under an Emergency Use Authorization (EUA). This EUA will remain in effect (meaning this test can be used) for the duration of the COVID-19 declaration under Section 564(b)(1) of the Act, 21 U.S.C. section 360bbb-3(b)(1), unless the authorization is terminated or revoked.  Performed at [facility], [place] [address], [place], [place] [address]   Blood Culture (routine x 2)     Status: None   Collection Time: 11/05/20  8:36 PM   Specimen: BLOOD  Result Value Ref Range Status   Specimen Description   Final    BLOOD BLOOD RIGHT FOREARM Performed at [place] [address], [place], [place] [address]    Special Requests   Final    BOTTLES DRAWN AEROBIC AND ANAEROBIC Blood Culture results may not be optimal due to an inadequate volume of blood received in culture bottles Performed at [place] [address], [place], [place] [address]    Culture   Final    NO GROWTH 5 DAYS Performed at [facility], [place] [address], [place], [place] [address]    Report Status 11/11/2020 FINAL  Final  Blood Culture (routine x 2)     Status: None   Collection Time: 11/05/20  8:38  PM   Specimen: BLOOD  Result Value Ref Range Status   Specimen Description   Final    BLOOD Performed at [place] [address], [place], [place] [address]    Special Requests   Final    BOTTLES DRAWN AEROBIC AND ANAEROBIC Blood Culture results may not be optimal due to an inadequate volume of blood received in culture bottles Performed at [facility] [address] [place], [place] [address]    Culture   Final    NO GROWTH 5 DAYS Performed at [facility], [place] [address], [place], [place] [address]    Report Status 11/11/2020 FINAL  Final  Urine culture     Status: Abnormal   Collection Time: 11/05/20  8:38 PM   Specimen: In/Out Cath Urine  Result Value Ref Range Status   Specimen Description   Final    IN/OUT CATH URINE Performed at [facility] [place] [address], [place], [place] [address]    Special Requests   Final    NONE Performed at [facility], [place] [address], [place], [place] [address]    Culture 60,000 COLONIES/mL ESCHERICHIA COLI (A)  Final   Report Status 11/08/2020 FINAL  Final   Organism ID, Bacteria ESCHERICHIA COLI (A)  Final      Susceptibility   Escherichia coli - MIC*    AMPICILLIN 4 SENSITIVE Sensitive     CEFAZOLIN <=4 SENSITIVE Sensitive     CEFEPIME <=0.12 SENSITIVE Sensitive     CEFTRIAXONE <=0.25 SENSITIVE Sensitive     CIPROFLOXACIN <=0.25 SENSITIVE Sensitive     GENTAMICIN <=1 SENSITIVE Sensitive     IMIPENEM <=0.25 SENSITIVE Sensitive     NITROFURANTOIN <=16 SENSITIVE Sensitive     TRIMETH/SULFA <=20 SENSITIVE Sensitive     AMPICILLIN/SULBACTAM <=2 SENSITIVE Sensitive     PIP/TAZO <=4 SENSITIVE Sensitive     * 60,000 COLONIES/mL  ESCHERICHIA COLI  MRSA PCR Screening     Status: None   Collection Time: 11/06/20  8:42 AM   Specimen: Nasopharyngeal  Result Value Ref Range Status   MRSA by PCR NEGATIVE NEGATIVE Final    Comment:        The GeneXpert MRSA  Assay (FDA approved for NASAL specimens only), is one component of a comprehensive MRSA colonization surveillance program. It is not intended to diagnose MRSA infection nor to guide or monitor treatment for MRSA infections. Performed at [facility], [place] [address] [place], [place] [address]   Aerobic/Anaerobic Culture (surgical/deep wound)     Status: None (Preliminary result)   Collection Time: 11/06/20  3:45 PM   Specimen: Liver; Abscess  Result Value Ref Range Status   Specimen Description   Final    LIVER Performed at [place] [address], [place], [place] [address]    Special Requests   Final    NONE Performed at [facility], [place] [address] [place], [place] [address]    Gram Stain   Final    FEW GRAM VARIABLE ROD Performed at [facility], [place] [address], [place], [place] [address]    Culture   Final    ABUNDANT KLEBSIELLA PNEUMONIAE ABUNDANT ESCHERICHIA COLI NO ANAEROBES ISOLATED; CULTURE IN PROGRESS FOR 5 DAYS    Report Status PENDING  Incomplete   Organism ID, Bacteria KLEBSIELLA PNEUMONIAE  Final   Organism ID, Bacteria ESCHERICHIA COLI  Final      Susceptibility   Escherichia coli - MIC*    AMPICILLIN <=2 SENSITIVE Sensitive     CEFAZOLIN <=4 SENSITIVE Sensitive     CEFEPIME <=0.12 SENSITIVE Sensitive     CEFTAZIDIME <=1 SENSITIVE Sensitive     CEFTRIAXONE <=0.25 SENSITIVE Sensitive     CIPROFLOXACIN <=0.25 SENSITIVE Sensitive     GENTAMICIN <=1 SENSITIVE Sensitive     IMIPENEM <=0.25 SENSITIVE Sensitive     TRIMETH/SULFA <=20 SENSITIVE Sensitive     AMPICILLIN/SULBACTAM <=2 SENSITIVE Sensitive     PIP/TAZO <=4 SENSITIVE Sensitive     * ABUNDANT ESCHERICHIA COLI   Klebsiella pneumoniae - MIC*    AMPICILLIN >=32 RESISTANT Resistant     CEFAZOLIN <=4 SENSITIVE Sensitive     CEFEPIME <=0.12 SENSITIVE Sensitive     CEFTAZIDIME <=1 SENSITIVE Sensitive     CEFTRIAXONE <=0.25 SENSITIVE  Sensitive     CIPROFLOXACIN <=0.25 SENSITIVE Sensitive     GENTAMICIN <=1 SENSITIVE Sensitive     IMIPENEM <=0.25 SENSITIVE Sensitive     TRIMETH/SULFA <=20 SENSITIVE Sensitive     AMPICILLIN/SULBACTAM 16 INTERMEDIATE Intermediate     PIP/TAZO 16 SENSITIVE Sensitive     * ABUNDANT KLEBSIELLA PNEUMONIAE  Aerobic/Anaerobic Culture w Gram Stain (surgical/deep wound)     Status: None   Collection Time: 11/06/20  3:46 PM   Specimen: Liver; Abscess  Result Value Ref Range Status   Specimen Description LIVER ABSCESS  Final   Special Requests MORE INFERIOR RIGHT LIVER  Final   Gram Stain   Final    FEW WBC PRESENT, PREDOMINANTLY PMN FEW GRAM NEGATIVE RODS RARE GRAM POSITIVE COCCI IN PAIRS    Culture   Final    ABUNDANT ESCHERICHIA COLI ABUNDANT STREPTOCOCCUS ANGINOSIS NO ANAEROBES ISOLATED Performed at [facility], [place] [address] [place], [place] [address]    Report Status 11/11/2020 FINAL  Final   Organism ID, Bacteria ESCHERICHIA COLI  Final   Organism ID,  Bacteria STREPTOCOCCUS ANGINOSIS  Final      Susceptibility   Escherichia coli - MIC*    AMPICILLIN 4 SENSITIVE Sensitive     CEFAZOLIN <=4 SENSITIVE Sensitive     CEFEPIME <=0.12 SENSITIVE Sensitive     CEFTAZIDIME <=1 SENSITIVE Sensitive     CEFTRIAXONE <=0.25 SENSITIVE Sensitive     CIPROFLOXACIN <=0.25 SENSITIVE Sensitive     GENTAMICIN <=1 SENSITIVE Sensitive     IMIPENEM <=0.25 SENSITIVE Sensitive     TRIMETH/SULFA <=20 SENSITIVE Sensitive     AMPICILLIN/SULBACTAM <=2 SENSITIVE Sensitive     PIP/TAZO <=4 SENSITIVE Sensitive     * ABUNDANT ESCHERICHIA COLI   Streptococcus anginosis - MIC*    PENICILLIN <=0.06 SENSITIVE Sensitive     CEFTRIAXONE 0.5 SENSITIVE Sensitive     ERYTHROMYCIN <=0.12 SENSITIVE Sensitive     LEVOFLOXACIN 0.5 SENSITIVE Sensitive     VANCOMYCIN 1 SENSITIVE Sensitive     * ABUNDANT STREPTOCOCCUS ANGINOSIS    Studies/Results: No results found.    Assessment/Plan:  INTERVAL  HISTORY:   To transfer out of the ICU to regular floor being considered for inpatient rehab   Principal Problem:   Liver abscess Active Problems:   Essential hypertension   GERD without esophagitis   Mixed diabetic hyperlipidemia associated with type 2 diabetes mellitus (HCC)   Adenocarcinoma of head of pancreas ([place])   Sepsis ([place])   Uncontrolled type 2 diabetes mellitus with ketoacidosis without coma, with long-term current use of insulin (HCC)   Increased anion gap metabolic acidosis   Chronic diastolic CHF (congestive heart failure) (HCC)   BPH with urinary obstruction   Pressure ulcer of sacral region, stage 2 (HCC)   Pneumonia of both lower lobes due to infectious organism   Sepsis secondary to UTI ([place])   Abscess   Empyema ([place])   Subdural hematoma (HCC)    Colton Bush is a 65 y.o. male with  Hx of pancreatic cancer recent fall with Strep anginoisis in 1/2 blood cultures sp surgery to hip fracture, readmitted with sepsis and found to have liver abscesses sp IR guided drain.  Now felt been found on imaging have an area concerning for subdural empyema.  He had been taken to the operating room since I saw him this morning undergone bur hole craniotomy with evacuation with what appears to be a hematoma instead.  We will continue with ceftriaxone and metronidazole and keep the ceftriaxone at high CNS dosing on the small chance that he might have had a bacterial infection in his hematoma.  I would like to give him 6 weeks of therapy which would also be sufficient to treat for endocarditis and should also be effective against his liver abscess.    Colton Bush has an appointment on 12/10/2020 @ 345 PM with Dr. Tommy Medal  The [facility] for Infectious Disease is located in the [facility] at  [place] in [place].  [address], which is located to the left of the elevators.  Phone: [phone]  Fax: [phone]  [email or web address]  He should arrive 15 to 30 minutes prior to his appointment.  I will sign off for now please call back with further questions.       LOS: 5 days   Alcide Evener 11/11/2020, 3:15 PM

## 2020-11-12 ENCOUNTER — Inpatient Hospital Stay (HOSPITAL_COMMUNITY)
Admission: RE | Admit: 2020-11-12 | Discharge: 2020-12-08 | DRG: 945 | Disposition: A | Discharge status: Home Health | Payer: Medicare Other | Source: Intra-hospital | Attending: Physical Medicine and Rehabilitation | Admitting: Physical Medicine and Rehabilitation

## 2020-11-12 ENCOUNTER — Ambulatory Visit: Payer: Medicare Other | Admitting: Radiation Oncology

## 2020-11-12 DIAGNOSIS — R7881 Bacteremia: Secondary | ICD-10-CM | POA: Diagnosis not present

## 2020-11-12 DIAGNOSIS — N320 Bladder-neck obstruction: Secondary | ICD-10-CM

## 2020-11-12 DIAGNOSIS — E782 Mixed hyperlipidemia: Secondary | ICD-10-CM | POA: Diagnosis not present

## 2020-11-12 DIAGNOSIS — E785 Hyperlipidemia, unspecified: Secondary | ICD-10-CM | POA: Diagnosis present

## 2020-11-12 DIAGNOSIS — R1312 Dysphagia, oropharyngeal phase: Secondary | ICD-10-CM | POA: Diagnosis not present

## 2020-11-12 DIAGNOSIS — M545 Low back pain, unspecified: Secondary | ICD-10-CM | POA: Diagnosis not present

## 2020-11-12 DIAGNOSIS — S065X9A Traumatic subdural hemorrhage with loss of consciousness of unspecified duration, initial encounter: Secondary | ICD-10-CM | POA: Diagnosis not present

## 2020-11-12 DIAGNOSIS — W19XXXD Unspecified fall, subsequent encounter: Secondary | ICD-10-CM | POA: Diagnosis not present

## 2020-11-12 DIAGNOSIS — J869 Pyothorax without fistula: Secondary | ICD-10-CM | POA: Diagnosis present

## 2020-11-12 DIAGNOSIS — K449 Diaphragmatic hernia without obstruction or gangrene: Secondary | ICD-10-CM | POA: Diagnosis present

## 2020-11-12 DIAGNOSIS — M47816 Spondylosis without myelopathy or radiculopathy, lumbar region: Secondary | ICD-10-CM | POA: Diagnosis not present

## 2020-11-12 DIAGNOSIS — R531 Weakness: Secondary | ICD-10-CM | POA: Diagnosis not present

## 2020-11-12 DIAGNOSIS — M19041 Primary osteoarthritis, right hand: Secondary | ICD-10-CM | POA: Diagnosis not present

## 2020-11-12 DIAGNOSIS — D638 Anemia in other chronic diseases classified elsewhere: Secondary | ICD-10-CM | POA: Diagnosis present

## 2020-11-12 DIAGNOSIS — M62838 Other muscle spasm: Secondary | ICD-10-CM | POA: Diagnosis present

## 2020-11-12 DIAGNOSIS — Z981 Arthrodesis status: Secondary | ICD-10-CM

## 2020-11-12 DIAGNOSIS — R569 Unspecified convulsions: Secondary | ICD-10-CM | POA: Diagnosis not present

## 2020-11-12 DIAGNOSIS — S065X0D Traumatic subdural hemorrhage without loss of consciousness, subsequent encounter: Secondary | ICD-10-CM | POA: Diagnosis not present

## 2020-11-12 DIAGNOSIS — R338 Other retention of urine: Secondary | ICD-10-CM | POA: Diagnosis present

## 2020-11-12 DIAGNOSIS — R52 Pain, unspecified: Secondary | ICD-10-CM | POA: Diagnosis not present

## 2020-11-12 DIAGNOSIS — K219 Gastro-esophageal reflux disease without esophagitis: Secondary | ICD-10-CM | POA: Diagnosis present

## 2020-11-12 DIAGNOSIS — E119 Type 2 diabetes mellitus without complications: Secondary | ICD-10-CM | POA: Diagnosis present

## 2020-11-12 DIAGNOSIS — R112 Nausea with vomiting, unspecified: Secondary | ICD-10-CM | POA: Diagnosis not present

## 2020-11-12 DIAGNOSIS — Z820 Family history of epilepsy and other diseases of the nervous system: Secondary | ICD-10-CM

## 2020-11-12 DIAGNOSIS — R079 Chest pain, unspecified: Secondary | ICD-10-CM

## 2020-11-12 DIAGNOSIS — C259 Malignant neoplasm of pancreas, unspecified: Secondary | ICD-10-CM | POA: Diagnosis present

## 2020-11-12 DIAGNOSIS — D630 Anemia in neoplastic disease: Secondary | ICD-10-CM | POA: Diagnosis not present

## 2020-11-12 DIAGNOSIS — S065XAA Traumatic subdural hemorrhage with loss of consciousness status unknown, initial encounter: Secondary | ICD-10-CM | POA: Diagnosis present

## 2020-11-12 DIAGNOSIS — E876 Hypokalemia: Secondary | ICD-10-CM | POA: Diagnosis not present

## 2020-11-12 DIAGNOSIS — Z1621 Resistance to vancomycin: Secondary | ICD-10-CM | POA: Diagnosis present

## 2020-11-12 DIAGNOSIS — Z6825 Body mass index (BMI) 25.0-25.9, adult: Secondary | ICD-10-CM

## 2020-11-12 DIAGNOSIS — S065X9D Traumatic subdural hemorrhage with loss of consciousness of unspecified duration, subsequent encounter: Principal | ICD-10-CM

## 2020-11-12 DIAGNOSIS — I951 Orthostatic hypotension: Secondary | ICD-10-CM | POA: Diagnosis not present

## 2020-11-12 DIAGNOSIS — Z8249 Family history of ischemic heart disease and other diseases of the circulatory system: Secondary | ICD-10-CM

## 2020-11-12 DIAGNOSIS — I5032 Chronic diastolic (congestive) heart failure: Secondary | ICD-10-CM | POA: Diagnosis present

## 2020-11-12 DIAGNOSIS — K750 Abscess of liver: Secondary | ICD-10-CM | POA: Diagnosis present

## 2020-11-12 DIAGNOSIS — B961 Klebsiella pneumoniae [K. pneumoniae] as the cause of diseases classified elsewhere: Secondary | ICD-10-CM | POA: Diagnosis not present

## 2020-11-12 DIAGNOSIS — N401 Enlarged prostate with lower urinary tract symptoms: Secondary | ICD-10-CM | POA: Diagnosis present

## 2020-11-12 DIAGNOSIS — Z794 Long term (current) use of insulin: Secondary | ICD-10-CM

## 2020-11-12 DIAGNOSIS — K222 Esophageal obstruction: Secondary | ICD-10-CM | POA: Diagnosis not present

## 2020-11-12 DIAGNOSIS — Z792 Long term (current) use of antibiotics: Secondary | ICD-10-CM

## 2020-11-12 DIAGNOSIS — E1169 Type 2 diabetes mellitus with other specified complication: Secondary | ICD-10-CM | POA: Diagnosis not present

## 2020-11-12 DIAGNOSIS — J90 Pleural effusion, not elsewhere classified: Secondary | ICD-10-CM | POA: Diagnosis not present

## 2020-11-12 DIAGNOSIS — Z978 Presence of other specified devices: Secondary | ICD-10-CM | POA: Diagnosis not present

## 2020-11-12 DIAGNOSIS — J9811 Atelectasis: Secondary | ICD-10-CM | POA: Diagnosis not present

## 2020-11-12 DIAGNOSIS — E663 Overweight: Secondary | ICD-10-CM | POA: Diagnosis present

## 2020-11-12 DIAGNOSIS — I517 Cardiomegaly: Secondary | ICD-10-CM | POA: Diagnosis not present

## 2020-11-12 DIAGNOSIS — B955 Unspecified streptococcus as the cause of diseases classified elsewhere: Secondary | ICD-10-CM | POA: Diagnosis not present

## 2020-11-12 DIAGNOSIS — M419 Scoliosis, unspecified: Secondary | ICD-10-CM | POA: Diagnosis present

## 2020-11-12 DIAGNOSIS — F32A Depression, unspecified: Secondary | ICD-10-CM | POA: Diagnosis not present

## 2020-11-12 DIAGNOSIS — S72001D Fracture of unspecified part of neck of right femur, subsequent encounter for closed fracture with routine healing: Secondary | ICD-10-CM | POA: Diagnosis not present

## 2020-11-12 DIAGNOSIS — R279 Unspecified lack of coordination: Secondary | ICD-10-CM | POA: Diagnosis not present

## 2020-11-12 DIAGNOSIS — B954 Other streptococcus as the cause of diseases classified elsewhere: Secondary | ICD-10-CM | POA: Diagnosis not present

## 2020-11-12 DIAGNOSIS — K8689 Other specified diseases of pancreas: Secondary | ICD-10-CM

## 2020-11-12 DIAGNOSIS — R339 Retention of urine, unspecified: Secondary | ICD-10-CM | POA: Diagnosis present

## 2020-11-12 DIAGNOSIS — S72001A Fracture of unspecified part of neck of right femur, initial encounter for closed fracture: Secondary | ICD-10-CM | POA: Diagnosis not present

## 2020-11-12 DIAGNOSIS — G40909 Epilepsy, unspecified, not intractable, without status epilepticus: Secondary | ICD-10-CM | POA: Diagnosis not present

## 2020-11-12 DIAGNOSIS — Z79899 Other long term (current) drug therapy: Secondary | ICD-10-CM

## 2020-11-12 DIAGNOSIS — Z9889 Other specified postprocedural states: Secondary | ICD-10-CM | POA: Diagnosis not present

## 2020-11-12 DIAGNOSIS — G709 Myoneural disorder, unspecified: Secondary | ICD-10-CM | POA: Diagnosis not present

## 2020-11-12 DIAGNOSIS — B952 Enterococcus as the cause of diseases classified elsewhere: Secondary | ICD-10-CM | POA: Diagnosis present

## 2020-11-12 DIAGNOSIS — M4722 Other spondylosis with radiculopathy, cervical region: Secondary | ICD-10-CM | POA: Diagnosis not present

## 2020-11-12 DIAGNOSIS — J948 Other specified pleural conditions: Secondary | ICD-10-CM | POA: Diagnosis not present

## 2020-11-12 DIAGNOSIS — Z823 Family history of stroke: Secondary | ICD-10-CM

## 2020-11-12 DIAGNOSIS — S066X9D Traumatic subarachnoid hemorrhage with loss of consciousness of unspecified duration, subsequent encounter: Secondary | ICD-10-CM | POA: Diagnosis not present

## 2020-11-12 DIAGNOSIS — I110 Hypertensive heart disease with heart failure: Secondary | ICD-10-CM | POA: Diagnosis present

## 2020-11-12 DIAGNOSIS — Z833 Family history of diabetes mellitus: Secondary | ICD-10-CM

## 2020-11-12 DIAGNOSIS — G929 Unspecified toxic encephalopathy: Secondary | ICD-10-CM

## 2020-11-12 DIAGNOSIS — L89152 Pressure ulcer of sacral region, stage 2: Secondary | ICD-10-CM | POA: Diagnosis present

## 2020-11-12 DIAGNOSIS — Z452 Encounter for adjustment and management of vascular access device: Secondary | ICD-10-CM | POA: Diagnosis not present

## 2020-11-12 DIAGNOSIS — Z743 Need for continuous supervision: Secondary | ICD-10-CM | POA: Diagnosis not present

## 2020-11-12 DIAGNOSIS — L89611 Pressure ulcer of right heel, stage 1: Secondary | ICD-10-CM | POA: Diagnosis present

## 2020-11-12 DIAGNOSIS — B9620 Unspecified Escherichia coli [E. coli] as the cause of diseases classified elsewhere: Secondary | ICD-10-CM | POA: Diagnosis not present

## 2020-11-12 LAB — GLUCOSE, CAPILLARY
Glucose-Capillary: 144 mg/dL — ABNORMAL HIGH (ref 70–99)
Glucose-Capillary: 266 mg/dL — ABNORMAL HIGH (ref 70–99)
Glucose-Capillary: 228 mg/dL — ABNORMAL HIGH (ref 70–99)
Glucose-Capillary: 301 mg/dL — ABNORMAL HIGH (ref 70–99)

## 2020-11-12 MED ORDER — ZINC SULFATE 220 (50 ZN) MG PO CAPS
220.0000 mg | ORAL_CAPSULE | Freq: Every day | ORAL | Status: DC
  Administered 2020-11-12 – 2020-12-08 (×27): 220 mg via ORAL
  Filled 2020-11-12 (×27): qty 1

## 2020-11-12 MED ORDER — LEVETIRACETAM 1000 MG PO TABS: 1000.0000 mg | ORAL_TABLET | Freq: Two times a day (BID) | ORAL | Status: DC

## 2020-11-12 MED ORDER — INSULIN ASPART 100 UNIT/ML ~~LOC~~ SOLN
0.0000 [IU] | Freq: Three times a day (TID) | SUBCUTANEOUS | Status: DC
  Administered 2020-11-13: 3 [IU] via SUBCUTANEOUS
  Administered 2020-11-13: 8 [IU] via SUBCUTANEOUS
  Administered 2020-11-13: 5 [IU] via SUBCUTANEOUS
  Administered 2020-11-14: 2 [IU] via SUBCUTANEOUS
  Administered 2020-11-14: 3 [IU] via SUBCUTANEOUS
  Administered 2020-11-14 – 2020-11-15 (×2): 5 [IU] via SUBCUTANEOUS
  Administered 2020-11-15: 3 [IU] via SUBCUTANEOUS
  Administered 2020-11-15 – 2020-11-16 (×3): 5 [IU] via SUBCUTANEOUS
  Administered 2020-11-17: 3 [IU] via SUBCUTANEOUS
  Administered 2020-11-17: 8 [IU] via SUBCUTANEOUS
  Administered 2020-11-18: 5 [IU] via SUBCUTANEOUS
  Administered 2020-11-18 – 2020-11-19 (×2): 3 [IU] via SUBCUTANEOUS
  Administered 2020-11-19: 5 [IU] via SUBCUTANEOUS
  Administered 2020-11-20 – 2020-11-21 (×3): 2 [IU] via SUBCUTANEOUS
  Administered 2020-11-21: 3 [IU] via SUBCUTANEOUS
  Administered 2020-11-22: 8 [IU] via SUBCUTANEOUS
  Administered 2020-11-22: 5 [IU] via SUBCUTANEOUS
  Administered 2020-11-23: 3 [IU] via SUBCUTANEOUS
  Administered 2020-11-23: 8 [IU] via SUBCUTANEOUS
  Administered 2020-11-24: 3 [IU] via SUBCUTANEOUS
  Administered 2020-11-24 – 2020-11-25 (×2): 5 [IU] via SUBCUTANEOUS
  Administered 2020-11-26: 3 [IU] via SUBCUTANEOUS
  Administered 2020-11-26: 2 [IU] via SUBCUTANEOUS
  Administered 2020-11-26: 5 [IU] via SUBCUTANEOUS
  Administered 2020-11-27 (×2): 3 [IU] via SUBCUTANEOUS
  Administered 2020-11-28 (×2): 2 [IU] via SUBCUTANEOUS
  Administered 2020-11-28: 5 [IU] via SUBCUTANEOUS
  Administered 2020-11-29: 3 [IU] via SUBCUTANEOUS
  Administered 2020-11-29: 5 [IU] via SUBCUTANEOUS
  Administered 2020-11-30: 3 [IU] via SUBCUTANEOUS
  Administered 2020-11-30 (×2): 2 [IU] via SUBCUTANEOUS
  Administered 2020-12-01 (×2): 5 [IU] via SUBCUTANEOUS
  Administered 2020-12-02: 2 [IU] via SUBCUTANEOUS
  Administered 2020-12-02: 8 [IU] via SUBCUTANEOUS
  Administered 2020-12-02: 3 [IU] via SUBCUTANEOUS
  Administered 2020-12-03: 2 [IU] via SUBCUTANEOUS
  Administered 2020-12-03: 5 [IU] via SUBCUTANEOUS
  Administered 2020-12-04 (×2): 2 [IU] via SUBCUTANEOUS
  Administered 2020-12-04: 5 [IU] via SUBCUTANEOUS
  Administered 2020-12-05: 2 [IU] via SUBCUTANEOUS
  Administered 2020-12-06 (×3): 5 [IU] via SUBCUTANEOUS
  Administered 2020-12-07: 8 [IU] via SUBCUTANEOUS
  Administered 2020-12-07: 3 [IU] via SUBCUTANEOUS
  Administered 2020-12-07: 8 [IU] via SUBCUTANEOUS
  Administered 2020-12-08: 2 [IU] via SUBCUTANEOUS

## 2020-11-12 MED ORDER — CEFTRIAXONE IV (FOR PTA / DISCHARGE USE ONLY): 2.0000 g | Freq: Two times a day (BID) | INTRAVENOUS | 0 refills | Status: DC

## 2020-11-12 MED ORDER — PANCRELIPASE (LIP-PROT-AMYL) 12000-38000 UNITS PO CPEP
12000.0000 [IU] | ORAL_CAPSULE | Freq: Three times a day (TID) | ORAL | Status: DC
  Administered 2020-11-13 – 2020-12-02 (×60): 12000 [IU] via ORAL
  Filled 2020-11-12 (×61): qty 1

## 2020-11-12 MED ORDER — ENOXAPARIN SODIUM 40 MG/0.4ML ~~LOC~~ SOLN: 40.0000 mg | Freq: Every day | SUBCUTANEOUS | Status: DC

## 2020-11-12 MED ORDER — INSULIN DETEMIR 100 UNIT/ML ~~LOC~~ SOLN
10.0000 [IU] | Freq: Two times a day (BID) | SUBCUTANEOUS | Status: DC
  Administered 2020-11-12 – 2020-11-13 (×2): 10 [IU] via SUBCUTANEOUS
  Filled 2020-11-12 (×3): qty 0.1

## 2020-11-12 MED ORDER — TRAZODONE HCL 50 MG PO TABS
25.0000 mg | ORAL_TABLET | Freq: Every evening | ORAL | Status: DC | PRN
Start: 2020-11-12 — End: 2020-11-18
  Administered 2020-11-16: 50 mg via ORAL
  Filled 2020-11-12: qty 1

## 2020-11-12 MED ORDER — PROCHLORPERAZINE MALEATE 5 MG PO TABS
5.0000 mg | ORAL_TABLET | Freq: Four times a day (QID) | ORAL | Status: DC | PRN
  Administered 2020-11-30 – 2020-12-01 (×2): 5 mg via ORAL
  Administered 2020-12-03: 10 mg via ORAL
  Filled 2020-11-12: qty 2
  Filled 2020-11-12 (×3): qty 1

## 2020-11-12 MED ORDER — METHOCARBAMOL 500 MG PO TABS: 500.0000 mg | ORAL_TABLET | Freq: Three times a day (TID) | ORAL | Status: DC | PRN

## 2020-11-12 MED ORDER — ASCORBIC ACID 500 MG PO TABS
500.0000 mg | ORAL_TABLET | Freq: Every day | ORAL | Status: DC
  Administered 2020-11-12 – 2020-12-08 (×27): 500 mg via ORAL
  Filled 2020-11-12 (×28): qty 1

## 2020-11-12 MED ORDER — GUAIFENESIN-DM 100-10 MG/5ML PO SYRP: 5.0000 mL | ORAL_SOLUTION | Freq: Four times a day (QID) | ORAL | Status: DC | PRN

## 2020-11-12 MED ORDER — TAMSULOSIN HCL 0.4 MG PO CAPS
0.8000 mg | ORAL_CAPSULE | Freq: Every day | ORAL | Status: DC
  Administered 2020-11-13 – 2020-12-07 (×25): 0.8 mg via ORAL
  Filled 2020-11-12 (×25): qty 2

## 2020-11-12 MED ORDER — JUVEN PO PACK
1.0000 | PACK | Freq: Two times a day (BID) | ORAL | Status: DC
  Administered 2020-11-13 – 2020-11-18 (×11): 1 via ORAL
  Filled 2020-11-12 (×12): qty 1

## 2020-11-12 MED ORDER — POLYETHYLENE GLYCOL 3350 17 G PO PACK
17.0000 g | PACK | Freq: Every day | ORAL | Status: DC | PRN
  Filled 2020-11-12: qty 1

## 2020-11-12 MED ORDER — SODIUM CHLORIDE 0.9 % IV SOLN
2.0000 g | Freq: Two times a day (BID) | INTRAVENOUS | Status: DC
  Administered 2020-11-12 – 2020-12-08 (×52): 2 g via INTRAVENOUS
  Filled 2020-11-12 (×3): qty 2
  Filled 2020-11-12 (×4): qty 20
  Filled 2020-11-12 (×3): qty 2
  Filled 2020-11-12: qty 20
  Filled 2020-11-12: qty 2
  Filled 2020-11-12 (×2): qty 20
  Filled 2020-11-12 (×2): qty 2
  Filled 2020-11-12 (×3): qty 20
  Filled 2020-11-12 (×3): qty 2
  Filled 2020-11-12: qty 20
  Filled 2020-11-12 (×6): qty 2
  Filled 2020-11-12: qty 20
  Filled 2020-11-12: qty 2
  Filled 2020-11-12 (×2): qty 20
  Filled 2020-11-12 (×4): qty 2
  Filled 2020-11-12: qty 20
  Filled 2020-11-12 (×2): qty 2
  Filled 2020-11-12: qty 20
  Filled 2020-11-12 (×6): qty 2
  Filled 2020-11-12: qty 20
  Filled 2020-11-12 (×3): qty 2
  Filled 2020-11-12 (×2): qty 20

## 2020-11-12 MED ORDER — FLEET ENEMA 7-19 GM/118ML RE ENEM: 1.0000 | ENEMA | Freq: Once | RECTAL | Status: DC | PRN

## 2020-11-12 MED ORDER — BISACODYL 10 MG RE SUPP: 10.0000 mg | Freq: Every day | RECTAL | Status: DC | PRN

## 2020-11-12 MED ORDER — IPRATROPIUM BROMIDE 0.02 % IN SOLN
0.5000 mg | Freq: Two times a day (BID) | RESPIRATORY_TRACT | Status: DC
  Administered 2020-11-12 – 2020-11-14 (×4): 0.5 mg via RESPIRATORY_TRACT
  Filled 2020-11-12 (×4): qty 2.5

## 2020-11-12 MED ORDER — LORATADINE 10 MG PO TABS
10.0000 mg | ORAL_TABLET | Freq: Every day | ORAL | Status: DC
  Administered 2020-11-13 – 2020-12-08 (×26): 10 mg via ORAL
  Filled 2020-11-12 (×26): qty 1

## 2020-11-12 MED ORDER — METRONIDAZOLE 500 MG PO TABS: 500.0000 mg | ORAL_TABLET | Freq: Three times a day (TID) | ORAL | 0 refills | Status: DC

## 2020-11-12 MED ORDER — ACETAMINOPHEN 325 MG PO TABS
325.0000 mg | ORAL_TABLET | ORAL | Status: DC | PRN
  Administered 2020-11-12 – 2020-12-07 (×16): 650 mg via ORAL
  Filled 2020-11-12 (×17): qty 2

## 2020-11-12 MED ORDER — METRONIDAZOLE 500 MG PO TABS
500.0000 mg | ORAL_TABLET | Freq: Three times a day (TID) | ORAL | Status: DC
  Administered 2020-11-12 – 2020-12-08 (×78): 500 mg via ORAL
  Filled 2020-11-12 (×84): qty 1

## 2020-11-12 MED ORDER — PROCHLORPERAZINE 25 MG RE SUPP: 12.5000 mg | Freq: Four times a day (QID) | RECTAL | Status: DC | PRN

## 2020-11-12 MED ORDER — INSULIN ASPART 100 UNIT/ML ~~LOC~~ SOLN
0.0000 [IU] | Freq: Every day | SUBCUTANEOUS | Status: DC
  Administered 2020-11-12: 4 [IU] via SUBCUTANEOUS
  Administered 2020-11-13: 2 [IU] via SUBCUTANEOUS
  Administered 2020-11-14 – 2020-11-16 (×2): 3 [IU] via SUBCUTANEOUS
  Administered 2020-11-18: 2 [IU] via SUBCUTANEOUS
  Administered 2020-11-23: 3 [IU] via SUBCUTANEOUS
  Administered 2020-11-27: 2 [IU] via SUBCUTANEOUS

## 2020-11-12 MED ORDER — DIPHENHYDRAMINE HCL 12.5 MG/5ML PO ELIX: 12.5000 mg | ORAL_SOLUTION | Freq: Four times a day (QID) | ORAL | Status: DC | PRN

## 2020-11-12 MED ORDER — LIDOCAINE 5 % EX PTCH
1.0000 | MEDICATED_PATCH | CUTANEOUS | Status: DC
  Administered 2020-11-12 – 2020-12-07 (×23): 1 via TRANSDERMAL
  Filled 2020-11-12 (×25): qty 1

## 2020-11-12 MED ORDER — POLYETHYLENE GLYCOL 3350 17 G PO PACK: 17.0000 g | PACK | Freq: Every day | ORAL | 0 refills | Status: DC | PRN

## 2020-11-12 MED ORDER — ALUM & MAG HYDROXIDE-SIMETH 200-200-20 MG/5ML PO SUSP
30.0000 mL | ORAL | Status: DC | PRN
  Administered 2020-11-25: 30 mL via ORAL
  Filled 2020-11-12: qty 30

## 2020-11-12 MED ORDER — PROCHLORPERAZINE EDISYLATE 10 MG/2ML IJ SOLN
5.0000 mg | Freq: Four times a day (QID) | INTRAMUSCULAR | Status: DC | PRN
Start: 2020-11-12 — End: 2020-12-08

## 2020-11-12 MED ORDER — PANTOPRAZOLE SODIUM 40 MG PO TBEC
40.0000 mg | DELAYED_RELEASE_TABLET | Freq: Every day | ORAL | Status: DC
  Administered 2020-11-13 – 2020-12-08 (×26): 40 mg via ORAL
  Filled 2020-11-12 (×26): qty 1

## 2020-11-12 MED ORDER — LEVALBUTEROL HCL 0.63 MG/3ML IN NEBU
0.6300 mg | INHALATION_SOLUTION | Freq: Two times a day (BID) | RESPIRATORY_TRACT | Status: DC
  Administered 2020-11-12 – 2020-11-14 (×4): 0.63 mg via RESPIRATORY_TRACT
  Filled 2020-11-12 (×4): qty 3

## 2020-11-12 MED ORDER — INSULIN DETEMIR 100 UNIT/ML ~~LOC~~ SOLN: 10.0000 [IU] | Freq: Two times a day (BID) | SUBCUTANEOUS | 11 refills | Status: DC

## 2020-11-12 MED ORDER — ORAL CARE MOUTH RINSE
15.0000 mL | Freq: Two times a day (BID) | OROMUCOSAL | Status: DC
  Administered 2020-11-12 – 2020-12-08 (×45): 15 mL via OROMUCOSAL

## 2020-11-12 MED ORDER — ENOXAPARIN SODIUM 40 MG/0.4ML ~~LOC~~ SOLN
40.0000 mg | SUBCUTANEOUS | Status: DC
  Administered 2020-11-12 – 2020-12-07 (×26): 40 mg via SUBCUTANEOUS
  Filled 2020-11-12 (×26): qty 0.4

## 2020-11-12 MED ORDER — LEVETIRACETAM 500 MG PO TABS
1000.0000 mg | ORAL_TABLET | Freq: Two times a day (BID) | ORAL | Status: DC
  Administered 2020-11-12 – 2020-12-08 (×52): 1000 mg via ORAL
  Filled 2020-11-12: qty 2
  Filled 2020-11-12: qty 4
  Filled 2020-11-12 (×50): qty 2

## 2020-11-12 NOTE — NC FL2 (Signed)
[place] LEVEL OF CARE SCREENING TOOL     IDENTIFICATION  Patient Name: Colton Bush Birthdate: 08-21-1955 Sex: male Admission Date (Current Location): 11/05/2020  [facility] and [place] Number:  Herbalist and Address:  [facility], [place] [address] [place], [place] [address]      Provider Number: [id number]  Attending Physician Name and Address:  Debbe Odea, MD  Relative Name and Phone Number:  Jermie Hippe (wife) Ph: [phone]    Current Level of Care: Hospital Recommended Level of Care: [place] Prior Approval Number:    Date Approved/Denied:   PASRR Number: [id number]  Discharge Plan: SNF    Current Diagnoses: Patient Active Problem List   Diagnosis Date Noted  . Abscess   . Empyema ([place])   . Subdural hematoma ([place])   . Pneumonia of both lower lobes due to infectious organism 11/06/2020  . Liver abscess 11/06/2020  . Sepsis secondary to UTI ([place]) 11/06/2020  . Sepsis ([place]) 11/05/2020  . Uncontrolled type 2 diabetes mellitus with ketoacidosis without coma, with long-term current use of insulin ([place]) 11/05/2020  . Increased anion gap metabolic acidosis 22/48/2500  . Chronic diastolic CHF (congestive heart failure) ([place]) 11/05/2020  . BPH with urinary obstruction 11/05/2020  . Pressure ulcer of sacral region, stage 2 ([place]) 11/05/2020  . Closed right hip fracture, initial encounter ([place]) 10/18/2020  . Fall from ground level 10/18/2020  . Hypoglycemia 06/19/2017  . Hypothermia 06/19/2017  . Goals of care, counseling/discussion 09/29/2016  . Port catheter in place 04/11/2016  . Hypercalcemia 03/29/2016  . Adenocarcinoma of head of pancreas ([place]) 03/17/2016  . Biliary obstruction   . Obstructive jaundice due to malignant neoplasm ([place]) 02/27/2016  . DKA (diabetic ketoacidoses) 02/27/2016  . Mixed diabetic hyperlipidemia associated with type 2 diabetes mellitus ([place]) 05/05/2014  . Cervical  spondylosis with myelopathy 08/18/2011  . CERUMEN IMPACTION 12/14/2008  . Hyperlipidemia, mixed 09/16/2007  . Essential hypertension 09/16/2007  . GERD without esophagitis 09/16/2007  . ESOPHAGEAL STRICTURE 04/04/2007  . HIATAL HERNIA 04/04/2007    Orientation RESPIRATION BLADDER Height & Weight     Self,Time,Situation,Place  Normal Continent,External catheter Weight: 190 lb 4.1 oz (86.3 kg) Height:  5\' 10"  (177.8 cm)  BEHAVIORAL SYMPTOMS/MOOD NEUROLOGICAL BOWEL NUTRITION STATUS      Incontinent Diet (Please see DC summary)  AMBULATORY STATUS COMMUNICATION OF NEEDS Skin   Extensive Assist Verbally PU Stage and Appropriate Care (Stage I on heel and sacrum; non-pressure wound on heel and sacrum; closed incision on head and hip; MASD on buttocks)                       Personal Care Assistance Level of Assistance  Bathing,Feeding,Dressing Bathing Assistance: Limited assistance Feeding assistance: Independent Dressing Assistance: Limited assistance     Functional Limitations Info  Sight,Hearing,Speech Sight Info: Adequate Hearing Info: Adequate Speech Info: Adequate    SPECIAL CARE FACTORS FREQUENCY  PT (By licensed PT),OT (By licensed OT)     PT Frequency: 5x/week OT Frequency: 5x/week            Contractures Contractures Info: Not present    Additional Factors Info  Code Status,Allergies,Psychotropic,Insulin Sliding Scale Code Status Info: Full Allergies Info: NKA Psychotropic Info: see dc summary Insulin Sliding Scale Info: see DC summary       Current Medications (11/12/2020):  This is the current hospital active medication list Current Facility-Administered Medications  Medication Dose Route Frequency Provider  Last Rate Last Admin  . 0.9 %  sodium chloride infusion   Intravenous Continuous Judith Part, MD   Stopped at 11/11/20 2224  . acetaminophen (TYLENOL) tablet 650 mg  650 mg Oral Q6H PRN Judith Part, MD   650 mg at 11/12/20 1607    Or  . acetaminophen (TYLENOL) suppository 650 mg  650 mg Rectal Q6H PRN Judith Part, MD      . cefTRIAXone (ROCEPHIN) 2 g in sodium chloride 0.9 % 100 mL IVPB  2 g Intravenous Q12H Judith Part, MD   Stopped at 11/11/20 2259  . Chlorhexidine Gluconate Cloth 2 % PADS 6 each  6 each Topical Daily Judith Part, MD   6 each at 11/11/20 1550  . dextrose 50 % solution 0-50 mL  0-50 mL Intravenous PRN Judith Part, MD      . enoxaparin (LOVENOX) injection 40 mg  40 mg Subcutaneous Daily Rizwan, Eunice Blase, MD   40 mg at 11/11/20 1559  . hydrALAZINE (APRESOLINE) injection 10 mg  10 mg Intravenous Q6H PRN Judith Part, MD      . insulin aspart (novoLOG) injection 0-15 Units  0-15 Units Subcutaneous TID WC Debbe Odea, MD   2 Units at 11/12/20 0818  . insulin aspart (novoLOG) injection 0-5 Units  0-5 Units Subcutaneous QHS Debbe Odea, MD   3 Units at 11/10/20 2127  . insulin detemir (LEVEMIR) injection 10 Units  10 Units Subcutaneous BID Debbe Odea, MD   10 Units at 11/11/20 2222  . ipratropium (ATROVENT) nebulizer solution 0.5 mg  0.5 mg Nebulization BID Judith Part, MD   0.5 mg at 11/12/20 0900  . levalbuterol (XOPENEX) nebulizer solution 0.63 mg  0.63 mg Nebulization BID Judith Part, MD   0.63 mg at 11/12/20 0900  . levETIRAcetam (KEPPRA) tablet 1,000 mg  1,000 mg Oral BID Debbe Odea, MD   1,000 mg at 11/11/20 2221  . lipase/protease/amylase (CREON) capsule 12,000 Units  12,000 Units Oral TID with meals Judith Part, MD   12,000 Units at 11/12/20 0820  . loratadine (CLARITIN) tablet 10 mg  10 mg Oral Daily Judith Part, MD   10 mg at 11/11/20 0936  . MEDLINE mouth rinse  15 mL Mouth Rinse BID Judith Part, MD   15 mL at 11/11/20 2222  . metroNIDAZOLE (FLAGYL) tablet 500 mg  500 mg Oral Q8H Judith Part, MD   500 mg at 11/12/20 3710  . ondansetron (ZOFRAN) tablet 4 mg  4 mg Oral Q6H PRN Judith Part, MD       Or   . ondansetron (ZOFRAN) injection 4 mg  4 mg Intravenous Q6H PRN Judith Part, MD      . pantoprazole (PROTONIX) EC tablet 40 mg  40 mg Oral Q1200 Debbe Odea, MD   40 mg at 11/11/20 1221  . polyethylene glycol (MIRALAX / GLYCOLAX) packet 17 g  17 g Oral Daily PRN Emelda Brothers A, MD      . sodium chloride flush (NS) 0.9 % injection 5 mL  5 mL Intracatheter Q8H Judith Part, MD   5 mL at 11/12/20 0614  . tamsulosin (FLOMAX) capsule 0.4 mg  0.4 mg Oral Daily Judith Part, MD   0.4 mg at 11/11/20 6269   Facility-Administered Medications Ordered in Other Encounters  Medication Dose Route Frequency Provider Last Rate Last Admin  . sodium chloride 0.9 % injection 10 mL  10 mL  Intravenous PRN Truitt Merle, MD   10 mL at 02/09/17 1111     Discharge Medications: Please see discharge summary for a list of discharge medications.  Relevant Imaging Results:  Relevant Lab Results:   Additional Information SSN: [id number]. [place] COVID-19 Vaccine 05/13/2020 , 10/03/2019 , 09/08/2019  Benard Halsted, LCSW

## 2020-11-12 NOTE — Progress Notes (Signed)
Referring Physician(s): Shalhoub, Governor [place])  Supervising Physician: Jacqulynn Cadet  Patient Status:  [facility] - In-pt  Chief Complaint: Hepatic abscess drain x2 F/U  Subjective:  Hepatic abscesses s/p hepatic drain placement x2 in IR 11/06/2020. Patient awake and alert sitting in bed. Asking to go home. Wife at bedside. Hepatic abscess drain dressings saturated with clear fluid vs water.   Allergies: Patient has no known allergies.  Medications: Prior to Admission medications   Medication Sig Start Date End Date Taking? Authorizing Provider  acetaminophen (TYLENOL) 500 MG tablet Take 1,000 mg by mouth every 6 (six) hours as needed for moderate pain or headache.   Yes [provider]  amLODipine (NORVASC) 10 MG tablet Take 1 tablet (10 mg total) by mouth daily. 01/13/20  Yes [name], MD  apixaban (ELIQUIS) 2.5 MG TABS tablet Take 1 tablet (2.5 mg total) by mouth 2 (two) times daily. 10/27/20 11/26/20 Yes Cherlynn June B, PA  benazepril (LOTENSIN) 10 MG tablet TAKE 1 TABLET DAILY Patient taking differently: Take 10 mg by mouth daily. 10/04/20  Yes [name], MD  cefTRIAXone (ROCEPHIN) IVPB Inject 2 g into the vein every 12 (twelve) hours. Indication:  liver abscess and possible infected subdural hematoma First Dose: No Last Day of Therapy:  12/21/20 Labs - Once weekly:  CBC/D and BMP, Labs - Every other week:  ESR and CRP Method of administration: IV Push Method of administration may be changed at the discretion of home infusion pharmacist based upon assessment of the patient and/or caregiver's ability to self-administer the medication ordered. 11/12/20  Yes Debbe Odea, MD  CREON 6000-19000 units CPEP Take 1 capsule by mouth with breakfast, with lunch, and with evening meal.  04/15/20  Yes [provider]  diphenhydrAMINE HCl, Sleep, 25 MG TBDP Take 25 mg by mouth at bedtime as needed (sleep).   Yes [provider]  ferrous sulfate 325 (65  FE) MG tablet Take 325 mg daily with breakfast by mouth.   Yes [provider]  Infant Care Products [facility]) OINT Apply 1 application topically 2 (two) times daily. T buttocks   Yes [provider]  insulin detemir (LEVEMIR) 100 UNIT/ML FlexPen Inject 25-35 Units into the skin See admin instructions. Inject 35 units in the morning and 25 units at night Patient taking differently: Inject 10-15 Units into the skin See admin instructions. Takes 10 units in the morning and 15 units every evening 10/28/20  Yes [place] Donnamarie Poag, DO  loratadine (CLARITIN) 10 MG tablet Take 10 mg by mouth daily.   Yes [provider]  melatonin 3 MG TABS tablet Take 3 mg by mouth at bedtime.   Yes [provider]  metroNIDAZOLE (FLAGYL) 500 MG tablet Take 1 tablet (500 mg total) by mouth 3 (three) times daily. Treat through 12/21/20 11/12/20  Yes Debbe Odea, MD  Multiple Vitamin (MULTIVITAMIN WITH MINERALS) TABS tablet Take 1 tablet by mouth daily.   Yes [provider]  NOVOLOG FLEXPEN 100 UNIT/ML FlexPen GIVE EVERY MORNING WITH BREAKFAST AND EVERY EVENING WITH SUPPER PER SLIDING SCALE Patient taking differently: Inject 2-13 Units into the skin 2 (two) times daily. Sliding Scale : 101-150 - 2u 151-200 - 3u 201-250 - 5u 251-300 - 7u 301-350 - 9u 351- 400 - 11u 401-450 - 13u Greater than 150, alert MD 04/17/17  Yes [name], MD  Omega-3 Fatty Acids (FISH OIL) 500 MG CAPS Take 500 mg by mouth daily.   Yes  [provider]  omeprazole (PRILOSEC) 20 MG capsule TAKE 1 CAPSULE DAILY Patient taking differently: Take 20 mg by mouth daily. 12/02/19  Yes [name], MD  oxyCODONE (OXY IR/ROXICODONE) 5 MG immediate release tablet Take 5 mg by mouth every 4 (four) hours as needed for severe pain or moderate pain.   Yes [provider]  potassium chloride SA (KLOR-CON M20) 20 MEQ tablet Take 1 tablet (20 mEq total) by mouth daily. 07/22/20  Yes [name],  MD  rosuvastatin (CRESTOR) 20 MG tablet Take 1 tablet (20 mg total) by mouth daily. 07/22/20  Yes [name], MD  tamsulosin (FLOMAX) 0.4 MG CAPS capsule Take 1 capsule (0.4 mg total) by mouth daily. 10/29/20  Yes [place] Eric J, DO  B-D UF III MINI PEN NEEDLES 31G X 5 MM MISC Inject into the skin. 07/12/20   [provider]  docusate sodium (COLACE) 100 MG capsule Take 1 capsule (100 mg total) by mouth 2 (two) times daily. Patient not taking: Reported on 11/06/2020 10/28/20   [place] Donnamarie Poag, DO  enoxaparin (LOVENOX) 40 MG/0.4ML injection Inject 0.4 mLs (40 mg total) into the skin daily. 11/13/20   Debbe Odea, MD  FREESTYLE LITE test strip USE ONE STRIP TO CHECK GLUCOSE ONCE DAILY. PLEASE  SCHEDULE FOLLOW UP 11/09/16   [name], MD  insulin detemir (LEVEMIR) 100 UNIT/ML injection Inject 0.1 mLs (10 Units total) into the skin 2 (two) times daily. 11/12/20   Debbe Odea, MD  Insulin Pen Needle 30G X 5 MM MISC Use one daily with insulin 03/04/16   [place], Gay Filler, MD  Lancets (FREESTYLE) lancets USE AS INSTRUCTED TO CHECK BLOOD SUGAR ONCE A DAY 06/02/16   [name], MD  levETIRAcetam (KEPPRA) 1000 MG tablet Take 1 tablet (1,000 mg total) by mouth 2 (two) times daily. 11/12/20   Debbe Odea, MD  polyethylene glycol (MIRALAX / GLYCOLAX) 17 g packet Take 17 g by mouth daily as needed for mild constipation. 11/12/20   Debbe Odea, MD     Vital Signs: BP 133/67   Pulse 88   Temp 98 F (36.7 C) (Axillary)   Resp (!) 21   Ht 5' 10"  (1.778 m)   Wt 190 lb 4.1 oz (86.3 kg)   SpO2 93%   BMI 27.30 kg/m   Physical Exam Vitals and nursing note reviewed.  Constitutional:      General: He is not in acute distress. HENT:     Head:     Comments: Left frontal incision c/d/i. Pulmonary:     Effort: Pulmonary effort is normal. No respiratory distress.  Abdominal:     Comments: Superior (#1) and inferior (#2) hepatic abscess drain (#1) dressings saturated with clear fluid- dressings  were changed bedside. Superior hepatic abscess drain (#1) with 25 cc of bilious appearing output in suction bulb. Inferior hepatic abscess drain (#2) with 15 cc output of clear pale fluid with debris in suction bulb. Both drains flush/aspirate without resistance.  Skin:    General: Skin is warm and dry.  Neurological:     Mental Status: He is alert and oriented to person, place, and time.     Imaging: EEG adult  Result Date: 11/08/2020 Plancher, Baron Sane, MD     11/08/2020  4:46 PM PROCEDURE: EEG video routine 23 minutesstudy DATES OF TEST: 11/08/20 REASON FOR TEST: Seizure AED/Sedative MEDICATIONS: Keppra TECHNIQUE: This is an 18 channel digital EEG recording using the standard international 10/20 system of electrode  placement with one channel EKG recording. Pt was awake and drowsy during this recording. FINDINGS: Background rhythm:symmetric 8.5-12 Hz  With intermittent asymmetric left 3-5 Hz Amplitude : normal Breach effect:  NO Variability: NO Reactivity: NO Rhythmic delta activity:NO Periodic discharges: NO Sporadic epileptiform discharges:NO Electrographic/electroclinical seizures:NO Limited EKG reveals no abnormalities. IMPRESSION: This is an abnormal study due to - 1. L Focal slowing is consistent with a focal disturbance of cerebral function and an underlying structural lesion should be considered. 2. No definite epileptiform discharges or electrographic seizures noted.   Overnight EEG with video  Result Date: 11/09/2020 Lora Havens, MD     11/10/2020  4:09 AM Patient Name: Colton Bush MRN: [id number] Epilepsy Attending: Lora Havens Referring Physician/Provider: Dr Roland Rack Duration: 11/08/2020 2157 to 11/09/2020 1008 Patient history: 65 year old male with a history of pancreatic adenocarcinoma with recurrence, recent fall and admission for sepsis, now with seizures. EEG to evaluate for seizure Level of alertness: Awake, asleep AEDs during EEG study: LEV Technical aspects:  This EEG study was done with scalp electrodes positioned according to the 10-20 International system of electrode placement. Electrical activity was acquired at a sampling rate of 500Hz  and reviewed with a high frequency filter of 70Hz  and a low frequency filter of 1Hz . EEG data were recorded continuously and digitally stored. Description: The posterior dominant rhythm consists of 9 Hz activity of moderate voltage (25-35 uV) seen predominantly in posterior head regions, symmetric and reactive to eye opening and eye closing.  Sleep was characterized by vertex waves, sleep spindles (12 to 14 Hz), maximal frontocentral region.  EEG showed intermittent 3 to 6 Hz theta-delta slowing in left temporal region. Hyperventilation and photic stimulation were not performed.   ABNORMALITY - Intermittent slow, left temporal region IMPRESSION: This study is suggestive of cortical dysfunction arising from left temporal region, nonspecific etiology but likely secondary to underlying subdural Hematoma. No seizures or definite epileptiform discharges were seen throughout the recording. Priyanka O Yadav    Labs:  CBC: Recent Labs    11/08/20 0237 11/09/20 0132 11/09/20 1251 11/10/20 0705 11/10/20 0829  WBC 11.7* 10.3  --  9.6 11.0*  HGB 9.9* 9.6* 10.2* 10.0* 10.9*  HCT 31.1* 29.4* 30.0* 30.6* 33.5*  PLT 410* 386  --  415* 469*    COAGS: Recent Labs    10/19/20 0259 10/24/20 0814 11/05/20 1930 11/06/20 0337  INR 1.4* 1.2 1.5* 1.6*  APTT  --   --  32 36    BMP: Recent Labs    04/14/20 0925 07/09/20 0825 11/09/20 0132 11/09/20 0801 11/09/20 1251 11/10/20 0705 11/10/20 0829  NA 141   < > 136 137 140 136 135  K 3.3*   < > 3.2* 3.0* 3.0* 3.3* 3.6  CL 107   < > 103 101  --  102 101  CO2 27   < > 27 28  --  30 26  GLUCOSE 73   < > 140* 179*  --  209* 210*  BUN 9   < > 5* <5*  --  6* 7*  CALCIUM 8.8*   < > 7.5* 7.7*  --  8.0* 8.0*  CREATININE 0.71   < > 0.44* 0.42*  --  0.42* 0.44*  GFRNONAA >60    < > >60 >60  --  >60 >60  GFRAA >60  --   --   --   --   --   --    < > = values in  this interval not displayed.    LIVER FUNCTION TESTS: Recent Labs    11/06/20 1424 11/07/20 0124 11/08/20 0237 11/09/20 0132  BILITOT 0.1* 0.8 0.7 0.6  AST 29 45* 50* 27  ALT 22 28 40 31  ALKPHOS 252* 230* 258* 234*  PROT 6.7 6.2* 6.2* 5.6*  ALBUMIN 2.2* 2.1* 2.1* 1.7*    Assessment and Plan:  Hepatic abscesses s/p hepatic drain placement x2 in IR 11/06/2020. Superior hepatic abscess drain (#1) stable with 25 cc of bilious appearing output in suction bulb (additional 30 cc output from drain in past 24 hours per chart). Inferior hepatic abscess drain (#2) stable with 15 cc output of clear pale fluid with debris in suction bulb (additional 15 cc output from drain in past 24 hours per chart). Continue current drain management- continue with Qshift flushes/monitor of output. Plan for repeat CT when output <10 cc/day (assess for possible removal).  For discharge to CIR today, If patient is to be discharged, below are discharge instructions: - Flush each drain once daily with 5-10 cc NS flush (patient will need an order for flushes upon discharge). RN aware to teach patient how to manage drains at home. - Record output from each drain once daily. - Follow-up at drain clinic 10-14 days after discharge for CT/possible drain injection (assess for possible drain removal)- IR schedulers to call patient to set up this appointment.  Further plans per [facility]- appreciate and agree with management. IR to follow.   Electronically Signed: Earley Abide, PA-C 11/12/2020, 2:22 PM   I spent a total of 15 Minutes at the the patient's bedside AND on the patient's hospital floor or unit, greater than 50% of which was counseling/coordinating care for hepatic abscesses s/p hepatic drain placement x2.

## 2020-11-12 NOTE — H&P (Shared)
Physical Medicine and Rehabilitation Admission H&P    CC: Functional deficits.   HPI: Colton Bush. Dunckel is a 65 year old male with history of T2DM (Dr. Bubba Camp), HTN, SCI, pancreatic cancer with recurrence and recent admission 10/18/20 for right hip fracture s/p ORIF complicated by UTI/strep bacteremia and urinary retention who was d/c to [facility] for rehab. He was readmitted from SNF on 11/05/20 with SIRS and was found to have multiple liver abscesses.  He underwent IR guided aspiration with drain placement revealing strep anginagnosis and Urine culture positive for E coli. Unasyn was recommended by Dr. Tommy Medal.  He continued to have issues with confusion and MRI brain done revealing suspected areas of acute ischemia in left temporal and parietal lobe but with diffusion changes.  CT head revealed late subacute or chronic SDH measuring 10 mm in thickness.  On 04/11, he developed seizures and was loaded with Keppra.  Swallow evaluation done revealing mild to moderate oropharyngeal dysphagia in part due to cognition and aspiration precautions recommended with  Dr. Venetia Constable was consulted for input due to concerns of subdural hemorrhage versus empyema and he was taken to the OR on 04/12 for bur hole evacuation. His antibiotics were changed to metronidazole and ceftriaxone on 04/12 as liver aspiration positive for Klebsiella and E. Coli and concerns of infected SDH. Postop has had significant improvement in speech and mentation and Dr. Leonel Ramsay recommends continuing current dose Keppra and following up with neurology on outpatient basis for wean. Foley was removed this am but he had problems with retention and was cathed for 1300 cc this am therefore foley was replaced. Therapy ongoing and patient continues to be limited  CIR recommended due to functional deficits.     Review of Systems  Constitutional: Negative for chills and fever.  HENT: Negative for hearing loss.   Eyes: Negative for blurred  vision and double vision.  Respiratory: Negative for cough, hemoptysis and stridor.   Cardiovascular: Negative for chest pain and palpitations.  Gastrointestinal: Negative for abdominal pain and heartburn.  Musculoskeletal: Positive for myalgias.  Skin: Negative for rash.  Neurological: Positive for weakness and headaches. Negative for dizziness.  Psychiatric/Behavioral: Positive for memory loss. The patient is nervous/anxious.      Past Medical History:  Diagnosis Date  . Arthritis    left hand  . Bronchitis 1977  . Cancer ([place]) 03/09/2016   pancreatic cancer, sees Dr. Cristino Martes at [facility]   . Depression    takes Cymbalta daily  . Diabetes mellitus type II    sees Dr. Chalmers Cater   . GERD (gastroesophageal reflux disease)    takes Omeprazole daily  . H/O hiatal hernia   . Hyperlipidemia    takes Zocor daily  . Hypertension    takes Amlodipine daily  . Hypoglycemia 06/18/2017  . Neck pain    C4-7 stenosis and herniated disc  . Neuromuscular disorder ([place])    hiatal hernia  . Scoliosis    slight  . Spinal cord injury, C5-C7 ([place])    c4-c7  . Stiffness of hand joint    d/t cervical issues    Past Surgical History:  Procedure Laterality Date  . ANTERIOR CERVICAL DECOMP/DISCECTOMY FUSION  08/18/2011   Procedure: ANTERIOR CERVICAL DECOMPRESSION/DISCECTOMY FUSION 3 LEVELS;  Surgeon: Winfield Cunas, MD;  Location: [facility];  Service: Neurosurgery;  Laterality: N/A;  Anterior Cervical Four-Five/Five-Six/Six-Seven Decompression with Fusion, Plating, and Bonegraft  . BURR HOLE Left 11/09/2020   Procedure: LEFT BURR HOLES  FOR EVACUATION OF Subdural Hematoma;  Surgeon: Judith Part, MD;  Location: [facility];  Service: Neurosurgery;  Laterality: Left;  . CARPAL TUNNEL RELEASE  2013   bilateral, per Dr. Christella Noa   . COLONOSCOPY  10-30-14   per Dr. Olevia Perches, clear, repeat in 10 yrs   . egd with esophageal dilation  9-08   per Dr. Olevia Perches  . ERCP N/A 03/01/2016   Procedure: ENDOSCOPIC  RETROGRADE CHOLANGIOPANCREATOGRAPHY (ERCP) with brushings and stent;  Surgeon: Doran Stabler, MD;  Location: [facility];  Service: Endoscopy;  Laterality: N/A;  . ESOPHAGOGASTRODUODENOSCOPY (EGD) WITH PROPOFOL N/A 05/27/2020   Procedure: ESOPHAGOGASTRODUODENOSCOPY (EGD) WITH PROPOFOL;  Surgeon: Milus Banister, MD;  Location: [facility];  Service: Endoscopy;  Laterality: N/A;  . EUS N/A 03/09/2016   Procedure: ESOPHAGEAL ENDOSCOPIC ULTRASOUND (EUS) RADIAL;  Surgeon: Milus Banister, MD;  Location: [facility];  Service: Endoscopy;  Laterality: N/A;  . EUS N/A 05/27/2020   Procedure: UPPER ENDOSCOPIC ULTRASOUND (EUS) RADIAL;  Surgeon: Milus Banister, MD;  Location: [facility];  Service: Endoscopy;  Laterality: N/A;  . FEMUR IM NAIL Right 10/25/2020   Procedure: INTRAMEDULLARY (IM) NAIL FEMORAL;  Surgeon: Rod Can, MD;  Location: [facility];  Service: Orthopedics;  Laterality: Right;  . lymph nodes biopsy    . melanoma rt calf  1999  . PORT-A-CATH REMOVAL N/A 04/03/2019   Procedure: PORT REMOVAL;  Surgeon: Stark Klein, MD;  Location: [facility];  Service: General;  Laterality: N/A;  . PORTACATH PLACEMENT Left 03/22/2016   Procedure: INSERTION PORT-A-CATH;  Surgeon: Stark Klein, MD;  Location: [facility];  Service: General;  Laterality: Left;  . SPINE SURGERY    . TONSILLECTOMY     as a child  . ULNAR TUNNEL RELEASE  2013   right arm, per Dr. Christella Noa   . UPPER GASTROINTESTINAL ENDOSCOPY    . WHIPPLE PROCEDURE N/A 09/19/2016   Procedure: DIAGNOSTIC LAPAROSCOPY, LAPAROSCOPIC LIVER BIOPSY, RETROPERITONEAL EXPLORATION, INTRAOPERATIVE ULTRASOUND;  Surgeon: Stark Klein, MD;  Location: [facility];  Service: General;  Laterality: N/A;    Family History  Problem Relation Age of Onset  . Heart disease Father   . Heart disease Brother 31  . Anesthesia problems Mother   . Heart disease Mother   . Dementia Mother   . Diabetes Sister   . Stroke Sister   . Colon cancer Neg Hx   . Rectal cancer Neg  Hx   . Stomach cancer Neg Hx     Social History:  Married. Retired in 2020 after cancer diagnosis.  Independent prior to hip fracture. reports that he has never smoked. He has never used smokeless tobacco. He reports that he does not drink alcohol and does not use drugs.    Allergies: No Known Allergies    Medications Prior to Admission  Medication Sig Dispense Refill  . acetaminophen (TYLENOL) 500 MG tablet Take 1,000 mg by mouth every 6 (six) hours as needed for moderate pain or headache.    Marland Kitchen amLODipine (NORVASC) 10 MG tablet Take 1 tablet (10 mg total) by mouth daily. 90 tablet 3  . apixaban (ELIQUIS) 2.5 MG TABS tablet Take 1 tablet (2.5 mg total) by mouth 2 (two) times daily. 60 tablet 0  . benazepril (LOTENSIN) 10 MG tablet TAKE 1 TABLET DAILY (Patient taking differently: Take 10 mg by mouth daily.) 90 tablet 1  . CREON 6000-19000 units CPEP Take 1 capsule by mouth with breakfast, with lunch, and with evening meal.     .  diphenhydrAMINE HCl, Sleep, 25 MG TBDP Take 25 mg by mouth at bedtime as needed (sleep).    . ferrous sulfate 325 (65 FE) MG tablet Take 325 mg daily with breakfast by mouth.    . Infant Care Products (DERMACLOUD) OINT Apply 1 application topically 2 (two) times daily. T buttocks    . insulin detemir (LEVEMIR) 100 UNIT/ML FlexPen Inject 25-35 Units into the skin See admin instructions. Inject 35 units in the morning and 25 units at night (Patient taking differently: Inject 10-15 Units into the skin See admin instructions. Takes 10 units in the morning and 15 units every evening)    . loratadine (CLARITIN) 10 MG tablet Take 10 mg by mouth daily.    . melatonin 3 MG TABS tablet Take 3 mg by mouth at bedtime.    . Multiple Vitamin (MULTIVITAMIN WITH MINERALS) TABS tablet Take 1 tablet by mouth daily.    Marland Kitchen NOVOLOG FLEXPEN 100 UNIT/ML FlexPen GIVE EVERY MORNING WITH BREAKFAST AND EVERY EVENING WITH SUPPER PER SLIDING SCALE (Patient taking differently: Inject 2-13 Units into  the skin 2 (two) times daily. Sliding Scale : 101-150 - 2u 151-200 - 3u 201-250 - 5u 251-300 - 7u 301-350 - 9u 351- 400 - 11u 401-450 - 13u Greater than 150, alert MD) 3 mL 0  . Omega-3 Fatty Acids (FISH OIL) 500 MG CAPS Take 500 mg by mouth daily.    Marland Kitchen omeprazole (PRILOSEC) 20 MG capsule TAKE 1 CAPSULE DAILY (Patient taking differently: Take 20 mg by mouth daily.) 90 capsule 3  . oxyCODONE (OXY IR/ROXICODONE) 5 MG immediate release tablet Take 5 mg by mouth every 4 (four) hours as needed for severe pain or moderate pain.    . potassium chloride SA (KLOR-CON M20) 20 MEQ tablet Take 1 tablet (20 mEq total) by mouth daily. 90 tablet 3  . rosuvastatin (CRESTOR) 20 MG tablet Take 1 tablet (20 mg total) by mouth daily. 90 tablet 3  . tamsulosin (FLOMAX) 0.4 MG CAPS capsule Take 1 capsule (0.4 mg total) by mouth daily. 30 capsule   . B-D UF III MINI PEN NEEDLES 31G X 5 MM MISC Inject into the skin.    Marland Kitchen docusate sodium (COLACE) 100 MG capsule Take 1 capsule (100 mg total) by mouth 2 (two) times daily. (Patient not taking: Reported on 11/06/2020) 10 capsule 0  . FREESTYLE LITE test strip USE ONE STRIP TO CHECK GLUCOSE ONCE DAILY. PLEASE  SCHEDULE FOLLOW UP 50 each 0  . Insulin Pen Needle 30G X 5 MM MISC Use one daily with insulin 100 each 2  . Lancets (FREESTYLE) lancets USE AS INSTRUCTED TO CHECK BLOOD SUGAR ONCE A DAY 100 each 2    Drug Regimen Review { DRUG REGIMEN [id number]}  Home: Home Living Family/patient expects to be discharged to:: Private residence Living Arrangements: Spouse/significant other (reports wife) Available Help at Discharge: Family,Available 24 hours/day Type of Home: House Home Access: Stairs to enter [employer] of Steps: (side of house) 1 step then laundry room then 2 steps to main level Entrance Stairs-Rails: None Home Layout: One level Bathroom Shower/Tub: Walk-in Armed forces operational officer: Standard Home Equipment: None Additional  Comments: reports bad experience at SNF and refusing to consider transfer to SNF again. pt states It will take me longer but im going home.pt state "ive done more here already then i did the whole time there. no one got me up"   Functional History: Prior Function Level of Independence: Needs assistance Gait / Transfers  Assistance Needed: limited mobility with RW ADL's / Homemaking Assistance Needed: needs assistance Comments: reports could use a WC in the home  Functional Status:  Mobility: Bed Mobility Overal bed mobility: Needs Assistance Bed Mobility: Supine to Sit Rolling: Total assist,Mod assist Supine to sit: +2 for physical assistance,Mod assist,HOB elevated Sit to supine: Mod assist,+2 for physical assistance,+2 for safety/equipment General bed mobility comments: Pt needing extra time and repeated cues to walk feet off L EOB. ModAx2 to complete leg transition and ascension of trunk using bed rails and HOB elevated. Transfers Overall transfer level: Needs assistance Equipment used: Rolling walker (2 wheeled) Transfers: Sit to/from [employer] Sit to Stand: +2 physical assistance,Mod assist,From elevated surface,+2 safety/equipment Stand pivot transfers: +2 physical assistance,From elevated surface,Mod assist,+2 safety/equipment General transfer comment: ModAx2 to power up to stand from elevated EOB 3x, cuing pt for proper hand placement of at least 1 hand on bed to push up to RW, poor carryover. ModAx2 to manage RW and direct pt to L to stand step transfer to recliner. Ambulation/Gait Ambulation/Gait assistance: +2 physical assistance,Mod assist,+2 safety/equipment Gait Distance (Feet): 3 Feet Assistive device: Rolling walker (2 wheeled) Gait Pattern/deviations: Decreased step length - right,Decreased step length - left,Decreased stride length,Decreased weight shift to right,Decreased weight shift to left,Shuffle,Trunk flexed General Gait Details: Pt with bil knee  and hip flexion, needing repeated cues to extend with close R knee guarding to ensure no buckling. Several side steps to L with RW and modAx2, cuing to shift weight and advance feet. Gait velocity: reduced Gait velocity interpretation: <1.31 ft/sec, indicative of household ambulator    ADL: ADL Overall ADL's : Needs assistance/impaired Eating/Feeding: Set up,Sitting Grooming: Set up,Sitting Upper Body Bathing: Minimal assistance,Set up,Sitting Lower Body Bathing: Maximal assistance,Set up,Bed level Upper Body Dressing : Moderate assistance,Set up,Sitting Lower Body Dressing: Maximal assistance Toilet Transfer: +2 for physical assistance,Maximal assistance Toilet Transfer Details (indicate cue type and reason): unable Toileting- Clothing Manipulation and Hygiene: Total assistance,Bed level General ADL Comments: pt progressed from EOB to chair this session. pt with x4 sit<>Stand this session. pt able to achieve better static standing each attempt  Cognition: Cognition Overall Cognitive Status: Impaired/Different from baseline Orientation Level: Oriented X4 Cognition Arousal/Alertness: Awake/alert Behavior During Therapy: Flat affect Overall Cognitive Status: Impaired/Different from baseline Area of Impairment: Awareness,Safety/judgement,Problem solving Safety/Judgement: Decreased awareness of safety,Decreased awareness of deficits Awareness: Emergent Problem Solving: Slow processing,Difficulty sequencing,Requires verbal cues,Requires tactile cues General Comments: Pt unaware that he had already had a bowel movement in bed, stating he felt like he needed to go but had not yet.  Physical Exam: Blood pressure 123/62, pulse 78, temperature 97.9 F (36.6 C), temperature source Oral, resp. rate (!) 22, height 5\' 10"  (1.778 m), weight 86.3 kg, SpO2 93 %. Physical Exam HENT:     Head: Normocephalic.     Comments: Left crani incision C/D/I and healing well    Mouth/Throat:     Mouth:  Mucous membranes are dry.  Abdominal:     General: There is no distension.     Comments: Well healed old midline incision. RUQ drains in place--one with bilious drainage and other with blood tinged drainage.   Musculoskeletal:     Comments: RLE with multiple small stapled incisions.   Neurological:     Mental Status: He is oriented to person, place, and time.     Comments: Soft voice with mild left facial weakness. Slow to respond but able to follow basic motor commands.  Results for orders placed or performed during the hospital encounter of 11/05/20 (from the past 48 hour(s))  Glucose, capillary     Status: Abnormal   Collection Time: 11/10/20 11:10 AM  Result Value Ref Range   Glucose-Capillary 311 (H) 70 - 99 mg/dL    Comment: Glucose reference range applies only to samples taken after fasting for at least 8 hours.  Glucose, capillary     Status: Abnormal   Collection Time: 11/10/20  3:20 PM  Result Value Ref Range   Glucose-Capillary 312 (H) 70 - 99 mg/dL    Comment: Glucose reference range applies only to samples taken after fasting for at least 8 hours.   Comment 1 Notify RN    Comment 2 Document in Chart   Glucose, capillary     Status: Abnormal   Collection Time: 11/10/20  7:23 PM  Result Value Ref Range   Glucose-Capillary 257 (H) 70 - 99 mg/dL    Comment: Glucose reference range applies only to samples taken after fasting for at least 8 hours.  Glucose, capillary     Status: Abnormal   Collection Time: 11/10/20  9:26 PM  Result Value Ref Range   Glucose-Capillary 271 (H) 70 - 99 mg/dL    Comment: Glucose reference range applies only to samples taken after fasting for at least 8 hours.  Glucose, capillary     Status: Abnormal   Collection Time: 11/11/20  7:22 AM  Result Value Ref Range   Glucose-Capillary 113 (H) 70 - 99 mg/dL    Comment: Glucose reference range applies only to samples taken after fasting for at least 8 hours.  Glucose, capillary     Status:  Abnormal   Collection Time: 11/11/20 11:14 AM  Result Value Ref Range   Glucose-Capillary 228 (H) 70 - 99 mg/dL    Comment: Glucose reference range applies only to samples taken after fasting for at least 8 hours.  Glucose, capillary     Status: Abnormal   Collection Time: 11/11/20  6:00 PM  Result Value Ref Range   Glucose-Capillary 214 (H) 70 - 99 mg/dL    Comment: Glucose reference range applies only to samples taken after fasting for at least 8 hours.  Glucose, capillary     Status: Abnormal   Collection Time: 11/11/20 11:00 PM  Result Value Ref Range   Glucose-Capillary 195 (H) 70 - 99 mg/dL    Comment: Glucose reference range applies only to samples taken after fasting for at least 8 hours.  Glucose, capillary     Status: Abnormal   Collection Time: 11/12/20  7:18 AM  Result Value Ref Range   Glucose-Capillary 144 (H) 70 - 99 mg/dL    Comment: Glucose reference range applies only to samples taken after fasting for at least 8 hours.   No results found.     Medical Problem List and Plan: 1.  *** secondary to ***  -patient may *** shower  -ELOS/Goals: *** 2.  Antithrombotics: -DVT/anticoagulation:  Pharmaceutical: Lovenox  -antiplatelet therapy: N/a 3. Pain Management: tylenol prn 4. Mood: LCSW to follow for evaluation and support.   -antipsychotic agents: N/A 5. Neuropsych: This patient is capable of making decisions on his own behalf. 6. Multiple deep tissue injuries/Skin/Wound Care: Air mattress overlay with pressure relief measures.   --Will add vitamins and collagen to promote wound healing  7. Fluids/Electrolytes/Nutrition: Monitor I/O. Check lytes in am. 8.  SDH (concerns of bacterial infection): S/p Burr hole evacuation   9. Strep anginosis  bacteremia: Continue Ceftriaxone/Metronidazole X 6 weeks w/end date  10 Liver abscess s/p JP drain placement: Continue flushes TID and document output. . 11. New onset seizures: To continue Keppra bid till follow up with neuro  on outpatient basis.  12. Left hip ORIF: WBAT 13.  T2DM: Hgb A1C- 10.8. Will monitor BS ac/hs. Home regimen Levemir 10u am/15u pm with SSI.   --continue Levemir bid with SSI and titrate insulin as indicated. 14. BPH/Urinary retention: Foley replaced--keep in place for decompression for few days than start bladder program.   --will increase flomax to 0.8 mg/HS   15. Pancreatic cancer with recurrence: Continue Creon with meals.  --follow up with Hem/onc and Radiation Onc after discharge.      ***  DWAYN MORAVEK, PA-C 11/12/2020

## 2020-11-12 NOTE — Progress Notes (Signed)
  Speech Language Pathology Treatment: Dysphagia  Patient Details Name: KANISHK STROEBEL MRN: [id number] DOB: 07-24-1956 Today's Date: 11/12/2020 Time: 1155-2080 SLP Time Calculation (min) (ACUTE ONLY): 11 min  Assessment / Plan / Recommendation Clinical Impression  Pt's dentures donned on therapist arrival and no evidence of poor fit as seen on Wednesday. On multiple observations he takes large bites and reports his wife "gets on him". He masticated half of graham cracker with appropriate time, efficiency and oral clearance. No indications aspiration with straw sips thin or 2 pill consumption with straw sips water (with RN). Recommend continue regular texture/thin liquids. Pt discharging to CIR today. He does not need follow up for swallow. Speech-language-cognitive eval was not ordered and therapist planned to request today prior to learning of CIR admission. Recommend SLE on CIR.    HPI HPI: Patient is a 65 year old Caucasian male with a past medical history significant for but not limited to pancreatic cancer diagnosed in 2017 with recent recurrence, diabetes mellitus type 2, hypertension, hyperlipidemia, chronic diastolic CHF, recent right hip fracture status post repair and intramedullary nail presented with a chief complaint of change in mental status. Found to be septic and further work-up revealed liver abscess, drains placed. Confusion continued and MRI revealed age indeterminate, mixed left subarachnoid and subdural hematoma, areas of suspected acute ischemia in the left temporal and parietal lobes.      SLP Plan  All goals met       Recommendations  Diet recommendations: Regular;Thin liquid Liquids provided via: Cup;Straw Medication Administration: Whole meds with liquid Supervision: Patient able to self feed;Intermittent supervision to cue for compensatory strategies Compensations: Slow rate;Small sips/bites Postural Changes and/or Swallow Maneuvers: Seated upright 90 degrees                 Oral Care Recommendations: Oral care BID Follow up Recommendations:  (none from swallowing) SLP Visit Diagnosis: Dysphagia, unspecified (R13.10) Plan: All goals met       GO                Houston Siren 11/12/2020, 1:11 PM  Orbie Pyo Alizeh Madril M.Ed Risk analyst [phone] Office [phone]

## 2020-11-12 NOTE — Consult Note (Signed)
   [facility] Inpatient Consult   11/12/2020  Colton Bush [birth year] [id number]  [place] [[facility]] Patient:  Medicare CMS DCE  Patient screened for disposition with noted high risk score for unplanned readmission risk and for less than 30 days readmission.  Review of patient's medical record reveals patient is to transition from hospital to inpatient rehabilitation.   Plan:  Will follow progress periodically in rehab and disposition to assess for post rehab care management needs, if appropriate.    For questions contact:   Natividad Brood, RN BSN [facility] Liaison  [phone] business mobile phone Toll free office [phone]  Fax number: [phone] [email or web address]

## 2020-11-12 NOTE — Progress Notes (Signed)
Inpatient Rehabilitation Medication Review by a Pharmacist  A complete drug regimen review was completed for this patient to identify any potential clinically significant medication issues.  Clinically significant medication issues were identified:  no  Check AMION for pharmacist assigned to patient if future medication questions/issues arise during this admission.  Pharmacist comments:   Time spent performing this drug regimen review (minutes):  10   Toby Breithaupt A. Levada Dy, PharmD, BCPS, FNKF Clinical Pharmacist [place] Please utilize Amion for appropriate phone number to reach the unit pharmacist ([place])  11/12/2020 7:25 PM

## 2020-11-12 NOTE — PMR Pre-admission (Signed)
PMR Admission Coordinator Pre-Admission Assessment  Patient: Colton Bush is an 65 y.o., male MRN: [id number] DOB: 1955-12-18 Height: 5\' 10"  (177.8 cm) Weight: 86.3 kg              Insurance Information HMO:     PPO:      PCP:      IPA:      80/20:      OTHER:  PRIMARY: Medicare A and B      Policy#: [id number]      Subscriber: pt CM Name:       Phone#:      Fax#:  Pre-Cert#: verified Civil engineer, contracting:  Benefits:  Phone #:      Name:  Eff. Date: 1/12020 A and B    Deduct: $1556      Out of Pocket Max: n/a      Life Max: n/a  CIR: 100%      SNF: 20 full days Outpatient: 80%     Co-Pay: 20% Home Health: 100%      Co-Pay:  DME: 80%     Co-Pay: 20% Providers:  SECONDARY: Engineer, production Supplement      Policy#: [id number]      Phone#:   Development worker, community:       Phone#:   The "Data Collection Information Summary" for patients in Inpatient Rehabilitation Facilities with attached "Privacy Act [place] Records" was provided and verbally reviewed with: Patient and Family  Emergency Contact Information Contact Information    Name Relation Home Work [place] [phone]  [phone]     Current Medical History  Patient Admitting Diagnosis: SAH/SDH s/p burrhole evacuation  History of Present Illness: Colton Bush is a 65 y.o. male with history of T2DM, pancreatic cancer with recurrance, recent admission for fall with hip fracture s/p IMN 03/21 and found to urinary retention treated with foley and was discharged to [facility] for rehab. He was readmitted to [facility] on 11/05/20 with mental status changes due SIRS and found to have multiple liver abscesses. He underwent IR guided aspiration with drain placement revealing strep anginousus and Unasyn recommended by Dr. Tommy Medal. He continued to have issues with confusion and MRI brain done revealing suspected areas of acute ischemia in left temporal and parietal lobes but without diffusion changes. CT head  revealed late subacute or chronic SDH measuring 10 mm thickness. On 04/11, he developed seizures and was loaded with Keppra. Dr. Venetia Constable consulted for input due to concern of SDH v/s empyema and patient was taken to OR on 04/12 for burr hole evacuation of empyema. Post op with significant improvement in speech and mentation and Dr. Addison Lank recommended continuing Keppra 1 gram bid. Therapy evaluations completed revealing posterior bias with standing attempts, knee instability, as well as poor awareness of deficits. CIR recommended due to functional decline.    Glasgow Coma Scale Score: 15  Past Medical History  Past Medical History:  Diagnosis Date  . Arthritis    left hand  . Bronchitis 1977  . Cancer ([place]) 03/09/2016   pancreatic cancer, sees Dr. Cristino Martes at [facility]   . Depression    takes Cymbalta daily  . Diabetes mellitus type II    sees Dr. Chalmers Cater   . GERD (gastroesophageal reflux disease)    takes Omeprazole daily  . H/O hiatal hernia   . Hyperlipidemia    takes Zocor daily  . Hypertension    takes Amlodipine daily  .  Hypoglycemia 06/18/2017  . Neck pain    C4-7 stenosis and herniated disc  . Neuromuscular disorder ([place])    hiatal hernia  . Scoliosis    slight  . Spinal cord injury, C5-C7 ([place])    c4-c7  . Stiffness of hand joint    d/t cervical issues    Family History  family history includes Anesthesia problems in his mother; Dementia in his mother; Diabetes in his sister; Heart disease in his father and mother; Heart disease (age of onset: 73) in his brother; Stroke in his sister.  Prior Rehab/Hospitalizations:  Has the patient had prior rehab or hospitalizations prior to admission? Yes  Has the patient had major surgery during 100 days prior to admission? Yes  Current Medications   Current Facility-Administered Medications:  .  0.9 %  sodium chloride infusion, , Intravenous, Continuous, Ostergard, Joyice Faster, MD, Stopped at 11/11/20 2224 .  acetaminophen  (TYLENOL) tablet 650 mg, 650 mg, Oral, Q6H PRN, 650 mg at 11/12/20 3086 **OR** acetaminophen (TYLENOL) suppository 650 mg, 650 mg, Rectal, Q6H PRN, Judith Part, MD .  cefTRIAXone (ROCEPHIN) 2 g in sodium chloride 0.9 % 100 mL IVPB, 2 g, Intravenous, Q12H, Ostergard, Thomas A, MD, Last Rate: 200 mL/hr at 11/12/20 1022, 2 g at 11/12/20 1022 .  Chlorhexidine Gluconate Cloth 2 % PADS 6 each, 6 each, Topical, Daily, Judith Part, MD, 6 each at 11/11/20 1550 .  dextrose 50 % solution 0-50 mL, 0-50 mL, Intravenous, PRN, Judith Part, MD .  enoxaparin (LOVENOX) injection 40 mg, 40 mg, Subcutaneous, Daily, Rizwan, Saima, MD, 40 mg at 11/12/20 1017 .  hydrALAZINE (APRESOLINE) injection 10 mg, 10 mg, Intravenous, Q6H PRN, Ostergard, Thomas A, MD .  insulin aspart (novoLOG) injection 0-15 Units, 0-15 Units, Subcutaneous, TID WC, Debbe Odea, MD, 2 Units at 11/12/20 0818 .  insulin aspart (novoLOG) injection 0-5 Units, 0-5 Units, Subcutaneous, QHS, Debbe Odea, MD, 3 Units at 11/10/20 2127 .  insulin detemir (LEVEMIR) injection 10 Units, 10 Units, Subcutaneous, BID, Debbe Odea, MD, 10 Units at 11/12/20 1017 .  ipratropium (ATROVENT) nebulizer solution 0.5 mg, 0.5 mg, Nebulization, BID, Ostergard, Thomas A, MD, 0.5 mg at 11/12/20 0900 .  levalbuterol (XOPENEX) nebulizer solution 0.63 mg, 0.63 mg, Nebulization, BID, Ostergard, Thomas A, MD, 0.63 mg at 11/12/20 0900 .  levETIRAcetam (KEPPRA) tablet 1,000 mg, 1,000 mg, Oral, BID, Rizwan, Saima, MD, 1,000 mg at 11/12/20 1018 .  lipase/protease/amylase (CREON) capsule 12,000 Units, 12,000 Units, Oral, TID with meals, Judith Part, MD, 12,000 Units at 11/12/20 0820 .  loratadine (CLARITIN) tablet 10 mg, 10 mg, Oral, Daily, Ostergard, Thomas A, MD, 10 mg at 11/12/20 1018 .  MEDLINE mouth rinse, 15 mL, Mouth Rinse, BID, Ostergard, Joyice Faster, MD, 15 mL at 11/12/20 1022 .  metroNIDAZOLE (FLAGYL) tablet 500 mg, 500 mg, Oral, Q8H, Ostergard,  Thomas A, MD, 500 mg at 11/12/20 5784 .  ondansetron (ZOFRAN) tablet 4 mg, 4 mg, Oral, Q6H PRN **OR** ondansetron (ZOFRAN) injection 4 mg, 4 mg, Intravenous, Q6H PRN, Ostergard, Thomas A, MD .  pantoprazole (PROTONIX) EC tablet 40 mg, 40 mg, Oral, Q1200, Rizwan, Saima, MD, 40 mg at 11/11/20 1221 .  polyethylene glycol (MIRALAX / GLYCOLAX) packet 17 g, 17 g, Oral, Daily PRN, Ostergard, Thomas A, MD .  sodium chloride flush (NS) 0.9 % injection 5 mL, 5 mL, Intracatheter, Q8H, Ostergard, Thomas A, MD, 5 mL at 11/12/20 0614 .  tamsulosin (FLOMAX) capsule 0.4 mg, 0.4 mg, Oral, Daily,  Judith Part, MD, 0.4 mg at 11/12/20 1018  Facility-Administered Medications Ordered in Other Encounters:  .  sodium chloride 0.9 % injection 10 mL, 10 mL, Intravenous, PRN, Truitt Merle, MD, 10 mL at 02/09/17 1111  Patients Current Diet:  Diet Order            Diet Carb Modified Fluid consistency: Thin; Room service appropriate? Yes with Assist  Diet effective now                 Precautions / Restrictions Precautions Precautions: Fall Precaution Comments: 2 JP drains R side Restrictions Weight Bearing Restrictions: Yes RLE Weight Bearing: Weight bearing as tolerated Other Position/Activity Restrictions: WBAT   Has the patient had 2 or more falls or a fall with injury in the past year?Yes  Prior Activity Level Community (5-7x/wk): prior to his hip fracture last month, pt was completely independent, no DME, still driving  Prior Functional Level Prior Function Level of Independence: Needs assistance Gait / Transfers Assistance Needed: limited mobility with RW ADL's / Homemaking Assistance Needed: needs assistance Comments: reports could use a WC in the home  Self Care: Did the patient need help bathing, dressing, using the toilet or eating?  Independent  Indoor Mobility: Did the patient need assistance with walking from room to room (with or without device)? Independent  Stairs: Did the  patient need assistance with internal or external stairs (with or without device)? Independent  Functional Cognition: Did the patient need help planning regular tasks such as shopping or remembering to take medications? Independent  Home Assistive Devices / Equipment Home Assistive Devices/Equipment: Environmental consultant (specify type),Wheelchair Home Equipment: None  Prior Device Use: Indicate devices/aids used by the patient prior to current illness, exacerbation or injury? None of the above and RW after most recent admission for hip fracture  Current Functional Level Cognition  Overall Cognitive Status: Impaired/Different from baseline Orientation Level: Oriented X4 Safety/Judgement: Decreased awareness of safety,Decreased awareness of deficits General Comments: Pt unaware that he had already had a bowel movement in bed, stating he felt like he needed to go but had not yet.    Extremity Assessment (includes Sensation/Coordination)  Upper Extremity Assessment: Defer to OT evaluation LUE Deficits / Details: aline in wrist so unable to use RW this session but rather hand held (A)  Lower Extremity Assessment: Generalized weakness,RLE deficits/detail RLE Deficits / Details: requires blocking when weight bearing due to intermittent knee buckling, indicating quads weakness, and reports pain with any movement    ADLs  Overall ADL's : Needs assistance/impaired Eating/Feeding: Set up,Sitting Grooming: Set up,Sitting Upper Body Bathing: Minimal assistance,Set up,Sitting Lower Body Bathing: Maximal assistance,Set up,Bed level Upper Body Dressing : Moderate assistance,Set up,Sitting Lower Body Dressing: Maximal assistance Toilet Transfer: +2 for physical assistance,Maximal assistance Toilet Transfer Details (indicate cue type and reason): unable Toileting- Clothing Manipulation and Hygiene: Total assistance,Bed level General ADL Comments: pt progressed from EOB to chair this session. pt with x4 sit<>Stand  this session. pt able to achieve better static standing each attempt    Mobility  Overal bed mobility: Needs Assistance Bed Mobility: Supine to Sit Rolling: Total assist,Mod assist Supine to sit: +2 for physical assistance,Mod assist,HOB elevated Sit to supine: Mod assist,+2 for physical assistance,+2 for safety/equipment General bed mobility comments: Pt needing extra time and repeated cues to walk feet off L EOB. ModAx2 to complete leg transition and ascension of trunk using bed rails and HOB elevated.    Transfers  Overall transfer level: Needs assistance Equipment used:  Rolling walker (2 wheeled) Transfers: Sit to/from [employer] Sit to Stand: +2 physical assistance,Mod assist,From elevated surface,+2 safety/equipment Stand pivot transfers: +2 physical assistance,From elevated surface,Mod assist,+2 safety/equipment General transfer comment: ModAx2 to power up to stand from elevated EOB 3x, cuing pt for proper hand placement of at least 1 hand on bed to push up to RW, poor carryover. ModAx2 to manage RW and direct pt to L to stand step transfer to recliner.    Ambulation / Gait / Stairs / Wheelchair Mobility  Ambulation/Gait Ambulation/Gait assistance: +2 physical assistance,Mod assist,+2 safety/equipment Gait Distance (Feet): 3 Feet Assistive device: Rolling walker (2 wheeled) Gait Pattern/deviations: Decreased step length - right,Decreased step length - left,Decreased stride length,Decreased weight shift to right,Decreased weight shift to left,Shuffle,Trunk flexed General Gait Details: Pt with bil knee and hip flexion, needing repeated cues to extend with close R knee guarding to ensure no buckling. Several side steps to L with RW and modAx2, cuing to shift weight and advance feet. Gait velocity: reduced Gait velocity interpretation: <1.31 ft/sec, indicative of household ambulator    Posture / Balance Dynamic Sitting Balance Sitting balance - Comments: Pt with poor  awareness of midline, needing repeated cues to tilt his head to L and lean more to L to find and maintain midline, min guard. Balance Overall balance assessment: Needs assistance Sitting-balance support: Bilateral upper extremity supported,Feet supported Sitting balance-[name]: Poor Sitting balance - Comments: Pt with poor awareness of midline, needing repeated cues to tilt his head to L and lean more to L to find and maintain midline, min guard. Standing balance support: Bilateral upper extremity supported,During functional activity Standing balance-[name]: Poor Standing balance comment: R lateral lean and bias. Pt reliant on bil UE support and external assist.    Special needs/care consideration Continuous Drip IV  Rocephin 278ml/hr q 12 hrs, Skin surgical incision to head and L hip (healing)  and Diabetic management yes     Previous Home Environment (from acute therapy documentation) Living Arrangements: Spouse/significant other (reports wife) Available Help at Discharge: Family,Available 24 hours/day Type of Home: House Home Layout: One level Home Access: Stairs to enter Entrance Stairs-Rails: None Entrance Stairs-Number of Steps: (side of house) 1 step then laundry room then 2 steps to main level Bathroom Shower/Tub: Walk-in shower,Tub/shower Personal assistant: Standard Home Care Services: No Additional Comments: reports bad experience at SNF and refusing to consider transfer to SNF again. pt states It will take me longer but im going home.pt state "ive done more here already then i did the whole time there. no one got me up"  Discharge Living Setting Plans for Discharge Living Setting: Patient's home Type of Home at Discharge: House Discharge Home Layout: One level Discharge Home Access: Stairs to enter Entrance Stairs-Rails: None Entrance Stairs-Number of Steps: 1+2 Discharge Bathroom Shower/Tub: Walk-in shower Discharge Bathroom Toilet: Standard Discharge  Bathroom Accessibility: No Does the patient have any problems obtaining your medications?: No  Social/Family/Support Systems Patient Roles: Spouse Anticipated Caregiver: spouse, Kallan Merrick Anticipated Caregiver's Contact Information: [phone]; [phone] Ability/Limitations of Caregiver: min assist for ADLs, supervision for mobility Caregiver Availability: 24/7 Discharge Plan Discussed with Primary Caregiver: Yes Is Caregiver In Agreement with Plan?: Yes Does Caregiver/Family have Issues with Lodging/Transportation while Pt is in Rehab?: No   Goals Patient/Family Goal for Rehab: PT/OT/SLP supervision to min assist Expected length of stay: 18-21 days Additional Information: previous stay in SNF and does not want to return Pt/Family Agrees to Admission and willing to participate: Yes  Program Orientation Provided & Reviewed with Pt/Caregiver Including Roles  & Responsibilities: Yes  Barriers to Discharge: Inaccessible home environment   Decrease burden of Care through IP rehab admission: n/a  Possible need for SNF placement upon discharge: Not anticipated, though possible if patient does not progress his mobility to min guard/supervision level.    Patient Condition: This patient's condition remains as documented in the consult dated 11/11/2020, in which the Rehabilitation Physician determined and documented that the patient's condition is appropriate for intensive rehabilitative care in an inpatient rehabilitation facility. Will admit to inpatient rehab today.  Preadmission Screen Completed By:  Michel Santee, PT, DPT 11/12/2020 10:29 AM ______________________________________________________________________   Discussed status with Dr. Dagoberto Ligas on 11/12/20 at  10:51 AM  and received approval for admission today.  Admission Coordinator:  Michel Santee, PT, DPT time 10:51 AM Sudie Grumbling 11/12/20

## 2020-11-12 NOTE — Progress Notes (Signed)
Report given to 4W RN Jeanette Caprice. Patient wife at bedside, all belongings taken with patient at time of transfer.

## 2020-11-12 NOTE — Progress Notes (Signed)
Physical Medicine and Rehabilitation Consult     Reason for Consult: Functional deficits Referring Physician: Dr. Wynelle Cleveland     HPI: Colton Bush is a 65 y.o. male with history of T2DM, pancreatic cancer with recurrance, recent admission for fall with hip fracture s/p ORIF 03/28 and found to urinary retention treated with foley and was discharged to [facility] for rehab. he was readmitted on 11/05/20 with mental status changes due SIRS and found ot have multiple liver abscesses. He underwent IR guided aspiration with drain placement revealing strep anginousus and Unasyn recommended by Dr. Tommy Medal. He continued to have issues with confusion and MRI brain done revealing suspected areas of acute ischemia in left temporal and parietal lobes but with diffusion changes. CT head revealed late subacute or chronic SDH measuring 10 mm thickness. On 04/11, he developed seizures and was loaded with Keppra. Dr. Venetia Constable consulted for input due to concern of SDH v/s empyema and patient was taken to OR on 04/12 for burr hole evacuation of empyema. Post op with significant improvement in speech and mentation and DrMarland Kitchen Addison Lank recommended continuing Keppra 1 gram bid. Therapy evaluations completed yesterday revealing posterior bias with standing attempts, knee instability as well as poor awareness of deficits. CIR recommended due to functional decline.    Has been sitting 2.5 hours in bedside chair. Thinks "he's ready to walk".  Ice pack on knees.  Pain not bed- denies SOB.  Has foley due to BPH.  Denies constipation-    Review of Systems  Constitutional: Negative for chills and fever.  HENT: Negative for hearing loss and tinnitus.   Eyes: Positive for discharge. Negative for blurred vision.  Respiratory: Negative for shortness of breath.   Cardiovascular: Negative for chest pain and palpitations.  Gastrointestinal: Negative for heartburn and nausea.  Genitourinary: Negative for dysuria.   Musculoskeletal: Positive for back pain, joint pain and myalgias.  Skin: Negative for rash.  Neurological: Positive for weakness and headaches.  Psychiatric/Behavioral: The patient is not nervous/anxious.   All other systems reviewed and are negative.           Past Medical History:  Diagnosis Date  . Arthritis      left hand  . Bronchitis 1977  . Cancer ([place]) 03/09/2016    pancreatic cancer, sees Dr. Cristino Martes at [facility]   . Depression      takes Cymbalta daily  . Diabetes mellitus type II      sees Dr. Chalmers Cater   . GERD (gastroesophageal reflux disease)      takes Omeprazole daily  . H/O hiatal hernia    . Hyperlipidemia      takes Zocor daily  . Hypertension      takes Amlodipine daily  . Hypoglycemia 06/18/2017  . Neck pain      C4-7 stenosis and herniated disc  . Neuromuscular disorder ([place])      hiatal hernia  . Scoliosis      slight  . Spinal cord injury, C5-C7 ([place])      c4-c7  . Stiffness of hand joint      d/t cervical issues           Past Surgical History:  Procedure Laterality Date  . ANTERIOR CERVICAL DECOMP/DISCECTOMY FUSION   08/18/2011    Procedure: ANTERIOR CERVICAL DECOMPRESSION/DISCECTOMY FUSION 3 LEVELS;  Surgeon: Winfield Cunas, MD;  Location: [facility];  Service: Neurosurgery;  Laterality: N/A;  Anterior Cervical Four-Five/Five-Six/Six-Seven Decompression  with Fusion, Plating, and Bonegraft  . BURR HOLE Left 11/09/2020    Procedure: LEFT BURR HOLES FOR EVACUATION OF Subdural Hematoma;  Surgeon: Judith Part, MD;  Location: [facility];  Service: Neurosurgery;  Laterality: Left;  . CARPAL TUNNEL RELEASE   2013    bilateral, per Dr. Christella Noa   . COLONOSCOPY   10-30-14    per Dr. Olevia Perches, clear, repeat in 10 yrs   . egd with esophageal dilation   9-08    per Dr. Olevia Perches  . ERCP N/A 03/01/2016    Procedure: ENDOSCOPIC RETROGRADE CHOLANGIOPANCREATOGRAPHY (ERCP) with brushings and stent;  Surgeon: Doran Stabler, MD;  Location: [facility];   Service: Endoscopy;  Laterality: N/A;  . ESOPHAGOGASTRODUODENOSCOPY (EGD) WITH PROPOFOL N/A 05/27/2020    Procedure: ESOPHAGOGASTRODUODENOSCOPY (EGD) WITH PROPOFOL;  Surgeon: Milus Banister, MD;  Location: [facility];  Service: Endoscopy;  Laterality: N/A;  . EUS N/A 03/09/2016    Procedure: ESOPHAGEAL ENDOSCOPIC ULTRASOUND (EUS) RADIAL;  Surgeon: Milus Banister, MD;  Location: [facility];  Service: Endoscopy;  Laterality: N/A;  . EUS N/A 05/27/2020    Procedure: UPPER ENDOSCOPIC ULTRASOUND (EUS) RADIAL;  Surgeon: Milus Banister, MD;  Location: [facility];  Service: Endoscopy;  Laterality: N/A;  . FEMUR IM NAIL Right 10/25/2020    Procedure: INTRAMEDULLARY (IM) NAIL FEMORAL;  Surgeon: Rod Can, MD;  Location: [facility];  Service: Orthopedics;  Laterality: Right;  . lymph nodes biopsy      . melanoma rt calf   1999  . PORT-A-CATH REMOVAL N/A 04/03/2019    Procedure: PORT REMOVAL;  Surgeon: Stark Klein, MD;  Location: [facility];  Service: General;  Laterality: N/A;  . PORTACATH PLACEMENT Left 03/22/2016    Procedure: INSERTION PORT-A-CATH;  Surgeon: Stark Klein, MD;  Location: [facility];  Service: General;  Laterality: Left;  . SPINE SURGERY      . TONSILLECTOMY        as a child  . ULNAR TUNNEL RELEASE   2013    right arm, per Dr. Christella Noa   . UPPER GASTROINTESTINAL ENDOSCOPY      . WHIPPLE PROCEDURE N/A 09/19/2016    Procedure: DIAGNOSTIC LAPAROSCOPY, LAPAROSCOPIC LIVER BIOPSY, RETROPERITONEAL EXPLORATION, INTRAOPERATIVE ULTRASOUND;  Surgeon: Stark Klein, MD;  Location: [facility];  Service: General;  Laterality: N/A;           Family History  Problem Relation Age of Onset  . Heart disease Father    . Heart disease Brother 73  . Anesthesia problems Mother    . Heart disease Mother    . Dementia Mother    . Diabetes Sister    . Stroke Sister    . Colon cancer Neg Hx    . Rectal cancer Neg Hx    . Stomach cancer Neg Hx        Social History:  Married. Independent prior to fall  last month. Per reports that he has never smoked. He has never used smokeless tobacco. He reports that he does not drink alcohol and does not use drugs.      Allergies: No Known Allergies            Medications Prior to Admission  Medication Sig Dispense Refill  . acetaminophen (TYLENOL) 500 MG tablet Take 1,000 mg by mouth every 6 (six) hours as needed for moderate pain or headache.      Marland Kitchen amLODipine (NORVASC) 10 MG tablet Take 1 tablet (10 mg total) by mouth daily. 90 tablet  3  . apixaban (ELIQUIS) 2.5 MG TABS tablet Take 1 tablet (2.5 mg total) by mouth 2 (two) times daily. 60 tablet 0  . benazepril (LOTENSIN) 10 MG tablet TAKE 1 TABLET DAILY (Patient taking differently: Take 10 mg by mouth daily.) 90 tablet 1  . CREON 6000-19000 units CPEP Take 1 capsule by mouth with breakfast, with lunch, and with evening meal.       . diphenhydrAMINE HCl, Sleep, 25 MG TBDP Take 25 mg by mouth at bedtime as needed (sleep).      . ferrous sulfate 325 (65 FE) MG tablet Take 325 mg daily with breakfast by mouth.      . Infant Care Products (DERMACLOUD) OINT Apply 1 application topically 2 (two) times daily. T buttocks      . insulin detemir (LEVEMIR) 100 UNIT/ML FlexPen Inject 25-35 Units into the skin See admin instructions. Inject 35 units in the morning and 25 units at night (Patient taking differently: Inject 10-15 Units into the skin See admin instructions. Takes 10 units in the morning and 15 units every evening)      . loratadine (CLARITIN) 10 MG tablet Take 10 mg by mouth daily.      . melatonin 3 MG TABS tablet Take 3 mg by mouth at bedtime.      . Multiple Vitamin (MULTIVITAMIN WITH MINERALS) TABS tablet Take 1 tablet by mouth daily.      Marland Kitchen NOVOLOG FLEXPEN 100 UNIT/ML FlexPen GIVE EVERY MORNING WITH BREAKFAST AND EVERY EVENING WITH SUPPER PER SLIDING SCALE (Patient taking differently: Inject 2-13 Units into the skin 2 (two) times daily. Sliding Scale : 101-150 - 2u 151-200 - 3u 201-250 -  5u 251-300 - 7u 301-350 - 9u 351- 400 - 11u 401-450 - 13u Greater than 150, alert MD) 3 mL 0  . Omega-3 Fatty Acids (FISH OIL) 500 MG CAPS Take 500 mg by mouth daily.      Marland Kitchen omeprazole (PRILOSEC) 20 MG capsule TAKE 1 CAPSULE DAILY (Patient taking differently: Take 20 mg by mouth daily.) 90 capsule 3  . oxyCODONE (OXY IR/ROXICODONE) 5 MG immediate release tablet Take 5 mg by mouth every 4 (four) hours as needed for severe pain or moderate pain.      . potassium chloride SA (KLOR-CON M20) 20 MEQ tablet Take 1 tablet (20 mEq total) by mouth daily. 90 tablet 3  . rosuvastatin (CRESTOR) 20 MG tablet Take 1 tablet (20 mg total) by mouth daily. 90 tablet 3  . tamsulosin (FLOMAX) 0.4 MG CAPS capsule Take 1 capsule (0.4 mg total) by mouth daily. 30 capsule    . B-D UF III MINI PEN NEEDLES 31G X 5 MM MISC Inject into the skin.      Marland Kitchen docusate sodium (COLACE) 100 MG capsule Take 1 capsule (100 mg total) by mouth 2 (two) times daily. (Patient not taking: Reported on 11/06/2020) 10 capsule 0  . FREESTYLE LITE test strip USE ONE STRIP TO CHECK GLUCOSE ONCE DAILY. PLEASE  SCHEDULE FOLLOW UP 50 each 0  . Insulin Pen Needle 30G X 5 MM MISC Use one daily with insulin 100 each 2  . Lancets (FREESTYLE) lancets USE AS INSTRUCTED TO CHECK BLOOD SUGAR ONCE A DAY 100 each 2      Home: Home Living Family/patient expects to be discharged to:: Private residence Living Arrangements: Spouse/significant other (reports wife) Available Help at Discharge: Family,Available 24 hours/day Type of Home: House Home Access: Stairs to enter [employer] of Steps: (side of house)  1 step then laundry room then 2 steps to main level Entrance Stairs-Rails: None Home Layout: One level Bathroom Shower/Tub: Walk-in Armed forces operational officer: Standard Home Equipment: None Additional Comments: reports bad experience at SNF and refusing to consider transfer to SNF again. pt states It will take me longer but im  going home.pt state "ive done more here already then i did the whole time there. no one got me up"  Functional History: Prior Function Level of Independence: Needs assistance Gait / Transfers Assistance Needed: limited mobility with RW ADL's / Homemaking Assistance Needed: needs assistance Comments: reports could use a WC in the home Functional Status:  Mobility: Bed Mobility Overal bed mobility: Needs Assistance Bed Mobility: Supine to Sit,Rolling Rolling: Total assist,Mod assist Supine to sit: +2 for physical assistance,Mod assist,HOB elevated (25 degrees) Sit to supine: Mod assist,+2 for physical assistance,+2 for safety/equipment General bed mobility comments: pt requires (A) to faciliate rolling and to elevate from bed suface pt unable to flex R Knee. pt with bil legs lifted off bed to allow pt to come into sitting. pt throughout session with c/o of knee pain Transfers Overall transfer level: Needs assistance Equipment used: 2 person hand held assist Transfers: Sit to/from [employer] Sit to Stand: +2 physical assistance,Mod assist,From elevated surface Stand pivot transfers: +2 physical assistance,Max assist,From elevated surface General transfer comment: ModAx2 to power up to stand from elevated EOB and maxAx2 to come to stand from lower surfaces. Intermittent R knee block due to buckling, requiring extra time and maxAx2 to stand step to L EOB > chair. Elevated seat surface in drop arm chair to allow easier transfer options for RN staff. pt sit<>Stand from bed x2 and from chair x1 to ensure he can go back to bed surface after sititng up. Ambulation/Gait Ambulation/Gait assistance: Max assist,+2 physical assistance Gait Distance (Feet): 3 Feet Assistive device: 2 person hand held assist Gait Pattern/deviations: Decreased step length - right,Decreased step length - left,Decreased stride length,Decreased weight shift to right,Decreased weight shift to left,Shuffle,Trunk  flexed General Gait Details: Pt with bil knee and hip flexion, needing repeated cues to extend with intermittent R knee block due to buckling. Several side steps to L with bil HHA and maxAx2, cuing to shift weight and advance feet. Gait velocity: reduced Gait velocity interpretation: <1.31 ft/sec, indicative of household ambulator   ADL: ADL Overall ADL's : Needs assistance/impaired Eating/Feeding: Set up,Sitting Grooming: Set up,Sitting Upper Body Bathing: Minimal assistance,Set up,Sitting Lower Body Bathing: Maximal assistance,Set up,Bed level Upper Body Dressing : Moderate assistance,Set up,Sitting Lower Body Dressing: Maximal assistance Toilet Transfer: +2 for physical assistance,Maximal assistance Toilet Transfer Details (indicate cue type and reason): unable Toileting- Clothing Manipulation and Hygiene: Total assistance,Bed level General ADL Comments: pt progressed from EOB to chair this session. pt with x4 sit<>Stand this session. pt able to achieve better static standing each attempt   Cognition: Cognition Overall Cognitive Status: Impaired/Different from baseline Orientation Level: Oriented X4 Cognition Arousal/Alertness: Awake/alert Behavior During Therapy: Flat affect Overall Cognitive Status: Impaired/Different from baseline General Comments: pt does not recognize doctor when asked. Dr Dawley reports only meeting him twice but pt unaware of doctor present performed last procedure. Pt states "ives seen so many doctors"     Blood pressure 127/62, pulse 87, temperature 98.6 F (37 C), temperature source Oral, resp. rate (!) 21, height 5\' 10"  (1.778 m), weight 86.3 kg, SpO2 91 %. Physical Exam Vitals and nursing note reviewed.  Constitutional:      General: He  is sleeping.     Appearance: Normal appearance.     Comments: Awake, somewhat alert, sitting up in bedside chair, NAD  HENT:     Head: Normocephalic.     Comments: L frontal incision dried blood-  On it- looks  OK, smile equal; tongue midline.     Right Ear: External ear normal.     Left Ear: External ear normal.     Nose: Nose normal. No congestion.     Mouth/Throat:     Mouth: Mucous membranes are dry.     Pharynx: Oropharynx is clear. No oropharyngeal exudate.  Eyes:     General:        Right eye: No discharge.        Left eye: No discharge.     Extraocular Movements: Extraocular movements intact.     Comments: Few beats nystagmus-  Cardiovascular:     Rate and Rhythm: Normal rate and regular rhythm.     Heart sounds: No murmur heard. No gallop.   Pulmonary:     Comments: CTA B/L- no W/R/R- good air movement Abdominal:     Comments: Soft, NT, ND, (+)BS    Genitourinary:    Comments: Foley in place- medium amber urine in bag Musculoskeletal:     Cervical back: Normal range of motion and neck supple.     Comments: 5-/5 B/L in UEs LEs- 4+/5 in HF, KE and DF/PF B/L  Skin:    Comments: L wrist/forearm/1 IV's good.   Neurological:     Mental Status: He is oriented to person, place, and time and easily aroused.     Comments: Able to day, date, year and follow simple motor commands. Internally distracted by headache.   Slowed speech; slow to speech; delayed responses "memorial day" is next holiday [place] is Sunday); slightly perseverative Is in [place]-   Psychiatric:     Comments: Delayed responses        Lab Results Last 24 Hours       Results for orders placed or performed during the hospital encounter of 11/05/20 (from the past 24 hour(s))  Glucose, capillary     Status: Abnormal    Collection Time: 11/10/20 11:10 AM  Result Value Ref Range    Glucose-Capillary 311 (H) 70 - 99 mg/dL  Glucose, capillary     Status: Abnormal    Collection Time: 11/10/20  3:20 PM  Result Value Ref Range    Glucose-Capillary 312 (H) 70 - 99 mg/dL    Comment 1 Notify RN      Comment 2 Document in Chart    Glucose, capillary     Status: Abnormal    Collection Time: 11/10/20  7:23 PM   Result Value Ref Range    Glucose-Capillary 257 (H) 70 - 99 mg/dL  Glucose, capillary     Status: Abnormal    Collection Time: 11/10/20  9:26 PM  Result Value Ref Range    Glucose-Capillary 271 (H) 70 - 99 mg/dL  Glucose, capillary     Status: Abnormal    Collection Time: 11/11/20  7:22 AM  Result Value Ref Range    Glucose-Capillary 113 (H) 70 - 99 mg/dL      Imaging Results (Last 48 hours)  No results found.       Assessment/Plan: Diagnosis: SAH/SDH s/p evacuation 1. Does the need for close, 24 hr/day medical supervision in concert with the patient's rehab needs make it unreasonable for this patient to be served in a  less intensive setting? Yes 2. Co-Morbidities requiring supervision/potential complications: seizures pre-op, confusion; Pancreatic CA; DM; recent hip fx s/p IM nail.  3. Due to bladder management, bowel management, safety, skin/wound care, disease management, medication administration, pain management and patient education, does the patient require 24 hr/day rehab nursing? Yes 4. Does the patient require coordinated care of a physician, rehab nurse, therapy disciplines of PT, OT and SLP to address physical and functional deficits in the context of the above medical diagnosis(es)? Yes Addressing deficits in the following areas: balance, endurance, locomotion, strength, transferring, bowel/bladder control, bathing, dressing, feeding, grooming, toileting, cognition, speech, language and swallowing 5. Can the patient actively participate in an intensive therapy program of at least 3 hrs of therapy per day at least 5 days per week? Yes 6. The potential for patient to make measurable gains while on inpatient rehab is good 7. Anticipated functional outcomes upon discharge from inpatient rehab are supervision and min assist  with PT, supervision and min assist with OT, supervision and min assist with SLP. 8. Estimated rehab length of stay to reach the above functional goals is:  2-3 weeks 9. Anticipated discharge destination: Home 10. Overall Rehab/Functional Prognosis: good   RECOMMENDATIONS: This patient's condition is appropriate for continued rehabilitative care in the following setting: CIR Patient has agreed to participate in recommended program. Potentially Note that insurance prior authorization may be required for reimbursement for recommended care.   Comment:  1. Pt is an appropriate candidate for CIR- will submit for admissions.    2. Thank you for this consult.      ALBY SCHWABE, PA-C 11/11/2020  \   I have personally performed a face to face diagnostic evaluation of this patient and formulated the key components of the plan.  Additionally, I have personally reviewed laboratory data, imaging studies, as well as relevant notes and concur with the physician assistant's documentation above.

## 2020-11-12 NOTE — Progress Notes (Signed)
PMR Admission Coordinator Pre-Admission Assessment   Patient: Colton Bush is an 65 y.o., male MRN: [id number] DOB: 12-Apr-1956 Height: 5\' 10"  (177.8 cm) Weight: 86.3 kg                                                                                                                                                  Insurance Information HMO:     PPO:      PCP:      IPA:      80/20:      OTHER:  PRIMARY: Medicare A and B      Policy#: [id number]      Subscriber: pt CM Name:       Phone#:      Fax#:  Pre-Cert#: verified Civil engineer, contracting:  Benefits:  Phone #:      Name:  Eff. Date: 1/12020 A and B    Deduct: $1556      Out of Pocket Max: n/a      Life Max: n/a  CIR: 100%      SNF: 20 full days Outpatient: 80%     Co-Pay: 20% Home Health: 100%      Co-Pay:  DME: 80%     Co-Pay: 20% Providers:  SECONDARY: Engineer, production Supplement      Policy#: [id number]      Phone#:    Development worker, community:       Phone#:    The "Data Collection Information Summary" for patients in Inpatient Rehabilitation Facilities with attached "Privacy Act [place] Records" was provided and verbally reviewed with: Patient and Family   Emergency Contact Information         Contact Information     Name Relation Home Work [place] [phone]   [phone]       Current Medical History  Patient Admitting Diagnosis: SAH/SDH s/p burrhole evacuation   History of Present Illness: Colton Bush is a 65 y.o. male with history of T2DM, pancreatic cancer with recurrance, recent admission for fall with hip fracture s/p IMN 03/21 and found to urinary retention treated with foley and was discharged to [facility] for rehab. He was readmitted to [facility] on 11/05/20 with mental status changes due SIRS and found to have multiple liver abscesses. He underwent IR guided aspiration with drain placement revealing strep anginousus and Unasyn recommended by Dr. Tommy Medal. He continued to have issues with  confusion and MRI brain done revealing suspected areas of acute ischemia in left temporal and parietal lobes but without diffusion changes. CT head revealed late subacute or chronic SDH measuring 10 mm thickness. On 04/11, he developed seizures and was loaded with Keppra. Dr. Venetia Constable consulted for input due to concern of SDH v/s empyema and patient was taken to OR on 04/12 for burr hole evacuation  of empyema. Post op with significant improvement in speech and mentation and Dr. Addison Lank recommended continuing Keppra 1 gram bid. Therapy evaluations completed revealing posterior bias with standing attempts, knee instability, as well as poor awareness of deficits. CIR recommended due to functional decline.  Glasgow Coma Scale Score: 15   Past Medical History      Past Medical History:  Diagnosis Date  . Arthritis      left hand  . Bronchitis 1977  . Cancer ([place]) 03/09/2016    pancreatic cancer, sees Dr. Cristino Martes at [facility]   . Depression      takes Cymbalta daily  . Diabetes mellitus type II      sees Dr. Chalmers Cater   . GERD (gastroesophageal reflux disease)      takes Omeprazole daily  . H/O hiatal hernia    . Hyperlipidemia      takes Zocor daily  . Hypertension      takes Amlodipine daily  . Hypoglycemia 06/18/2017  . Neck pain      C4-7 stenosis and herniated disc  . Neuromuscular disorder ([place])      hiatal hernia  . Scoliosis      slight  . Spinal cord injury, C5-C7 ([place])      c4-c7  . Stiffness of hand joint      d/t cervical issues      Family History  family history includes Anesthesia problems in his mother; Dementia in his mother; Diabetes in his sister; Heart disease in his father and mother; Heart disease (age of onset: 32) in his brother; Stroke in his sister.   Prior Rehab/Hospitalizations:  Has the patient had prior rehab or hospitalizations prior to admission? Yes   Has the patient had major surgery during 100 days prior to admission? Yes   Current Medications     Current Facility-Administered Medications:  .  0.9 %  sodium chloride infusion, , Intravenous, Continuous, Ostergard, Joyice Faster, MD, Stopped at 11/11/20 2224 .  acetaminophen (TYLENOL) tablet 650 mg, 650 mg, Oral, Q6H PRN, 650 mg at 11/12/20 6629 **OR** acetaminophen (TYLENOL) suppository 650 mg, 650 mg, Rectal, Q6H PRN, Judith Part, MD .  cefTRIAXone (ROCEPHIN) 2 g in sodium chloride 0.9 % 100 mL IVPB, 2 g, Intravenous, Q12H, Ostergard, Thomas A, MD, Last Rate: 200 mL/hr at 11/12/20 1022, 2 g at 11/12/20 1022 .  Chlorhexidine Gluconate Cloth 2 % PADS 6 each, 6 each, Topical, Daily, Judith Part, MD, 6 each at 11/11/20 1550 .  dextrose 50 % solution 0-50 mL, 0-50 mL, Intravenous, PRN, Judith Part, MD .  enoxaparin (LOVENOX) injection 40 mg, 40 mg, Subcutaneous, Daily, Rizwan, Saima, MD, 40 mg at 11/12/20 1017 .  hydrALAZINE (APRESOLINE) injection 10 mg, 10 mg, Intravenous, Q6H PRN, Ostergard, Thomas A, MD .  insulin aspart (novoLOG) injection 0-15 Units, 0-15 Units, Subcutaneous, TID WC, Debbe Odea, MD, 2 Units at 11/12/20 0818 .  insulin aspart (novoLOG) injection 0-5 Units, 0-5 Units, Subcutaneous, QHS, Debbe Odea, MD, 3 Units at 11/10/20 2127 .  insulin detemir (LEVEMIR) injection 10 Units, 10 Units, Subcutaneous, BID, Debbe Odea, MD, 10 Units at 11/12/20 1017 .  ipratropium (ATROVENT) nebulizer solution 0.5 mg, 0.5 mg, Nebulization, BID, Ostergard, Thomas A, MD, 0.5 mg at 11/12/20 0900 .  levalbuterol (XOPENEX) nebulizer solution 0.63 mg, 0.63 mg, Nebulization, BID, Ostergard, Thomas A, MD, 0.63 mg at 11/12/20 0900 .  levETIRAcetam (KEPPRA) tablet 1,000 mg, 1,000 mg, Oral, BID, Rizwan, Saima, MD, 1,000 mg at 11/12/20  1018 .  lipase/protease/amylase (CREON) capsule 12,000 Units, 12,000 Units, Oral, TID with meals, Judith Part, MD, 12,000 Units at 11/12/20 0820 .  loratadine (CLARITIN) tablet 10 mg, 10 mg, Oral, Daily, Ostergard, Thomas A, MD, 10 mg at  11/12/20 1018 .  MEDLINE mouth rinse, 15 mL, Mouth Rinse, BID, Ostergard, Joyice Faster, MD, 15 mL at 11/12/20 1022 .  metroNIDAZOLE (FLAGYL) tablet 500 mg, 500 mg, Oral, Q8H, Ostergard, Thomas A, MD, 500 mg at 11/12/20 5361 .  ondansetron (ZOFRAN) tablet 4 mg, 4 mg, Oral, Q6H PRN **OR** ondansetron (ZOFRAN) injection 4 mg, 4 mg, Intravenous, Q6H PRN, Ostergard, Thomas A, MD .  pantoprazole (PROTONIX) EC tablet 40 mg, 40 mg, Oral, Q1200, Rizwan, Saima, MD, 40 mg at 11/11/20 1221 .  polyethylene glycol (MIRALAX / GLYCOLAX) packet 17 g, 17 g, Oral, Daily PRN, Ostergard, Thomas A, MD .  sodium chloride flush (NS) 0.9 % injection 5 mL, 5 mL, Intracatheter, Q8H, Ostergard, Thomas A, MD, 5 mL at 11/12/20 0614 .  tamsulosin (FLOMAX) capsule 0.4 mg, 0.4 mg, Oral, Daily, Ostergard, Thomas A, MD, 0.4 mg at 11/12/20 1018   Facility-Administered Medications Ordered in Other Encounters:  .  sodium chloride 0.9 % injection 10 mL, 10 mL, Intravenous, PRN, Truitt Merle, MD, 10 mL at 02/09/17 1111   Patients Current Diet:     Diet Order                      Diet Carb Modified Fluid consistency: Thin; Room service appropriate? Yes with Assist  Diet effective now                      Precautions / Restrictions Precautions Precautions: Fall Precaution Comments: 2 JP drains R side Restrictions Weight Bearing Restrictions: Yes RLE Weight Bearing: Weight bearing as tolerated Other Position/Activity Restrictions: WBAT    Has the patient had 2 or more falls or a fall with injury in the past year?Yes   Prior Activity Level Community (5-7x/wk): prior to his hip fracture last month, pt was completely independent, no DME, still driving   Prior Functional Level Prior Function Level of Independence: Needs assistance Gait / Transfers Assistance Needed: limited mobility with RW ADL's / Homemaking Assistance Needed: needs assistance Comments: reports could use a WC in the home   Self Care: Did the patient need  help bathing, dressing, using the toilet or eating?  Independent   Indoor Mobility: Did the patient need assistance with walking from room to room (with or without device)? Independent   Stairs: Did the patient need assistance with internal or external stairs (with or without device)? Independent   Functional Cognition: Did the patient need help planning regular tasks such as shopping or remembering to take medications? Independent   Home Assistive Devices / Equipment Home Assistive Devices/Equipment: Environmental consultant (specify type),Wheelchair Home Equipment: None   Prior Device Use: Indicate devices/aids used by the patient prior to current illness, exacerbation or injury? None of the above and RW after most recent admission for hip fracture   Current Functional Level Cognition   Overall Cognitive Status: Impaired/Different from baseline Orientation Level: Oriented X4 Safety/Judgement: Decreased awareness of safety,Decreased awareness of deficits General Comments: Pt unaware that he had already had a bowel movement in bed, stating he felt like he needed to go but had not yet.    Extremity Assessment (includes Sensation/Coordination)   Upper Extremity Assessment: Defer to OT evaluation LUE Deficits / Details: aline in wrist  so unable to use RW this session but rather hand held (A)  Lower Extremity Assessment: Generalized weakness,RLE deficits/detail RLE Deficits / Details: requires blocking when weight bearing due to intermittent knee buckling, indicating quads weakness, and reports pain with any movement     ADLs   Overall ADL's : Needs assistance/impaired Eating/Feeding: Set up,Sitting Grooming: Set up,Sitting Upper Body Bathing: Minimal assistance,Set up,Sitting Lower Body Bathing: Maximal assistance,Set up,Bed level Upper Body Dressing : Moderate assistance,Set up,Sitting Lower Body Dressing: Maximal assistance Toilet Transfer: +2 for physical assistance,Maximal assistance Toilet  Transfer Details (indicate cue type and reason): unable Toileting- Clothing Manipulation and Hygiene: Total assistance,Bed level General ADL Comments: pt progressed from EOB to chair this session. pt with x4 sit<>Stand this session. pt able to achieve better static standing each attempt     Mobility   Overal bed mobility: Needs Assistance Bed Mobility: Supine to Sit Rolling: Total assist,Mod assist Supine to sit: +2 for physical assistance,Mod assist,HOB elevated Sit to supine: Mod assist,+2 for physical assistance,+2 for safety/equipment General bed mobility comments: Pt needing extra time and repeated cues to walk feet off L EOB. ModAx2 to complete leg transition and ascension of trunk using bed rails and HOB elevated.     Transfers   Overall transfer level: Needs assistance Equipment used: Rolling walker (2 wheeled) Transfers: Sit to/from [employer] Sit to Stand: +2 physical assistance,Mod assist,From elevated surface,+2 safety/equipment Stand pivot transfers: +2 physical assistance,From elevated surface,Mod assist,+2 safety/equipment General transfer comment: ModAx2 to power up to stand from elevated EOB 3x, cuing pt for proper hand placement of at least 1 hand on bed to push up to RW, poor carryover. ModAx2 to manage RW and direct pt to L to stand step transfer to recliner.     Ambulation / Gait / Stairs / Wheelchair Mobility   Ambulation/Gait Ambulation/Gait assistance: +2 physical assistance,Mod assist,+2 safety/equipment Gait Distance (Feet): 3 Feet Assistive device: Rolling walker (2 wheeled) Gait Pattern/deviations: Decreased step length - right,Decreased step length - left,Decreased stride length,Decreased weight shift to right,Decreased weight shift to left,Shuffle,Trunk flexed General Gait Details: Pt with bil knee and hip flexion, needing repeated cues to extend with close R knee guarding to ensure no buckling. Several side steps to L with RW and modAx2, cuing  to shift weight and advance feet. Gait velocity: reduced Gait velocity interpretation: <1.31 ft/sec, indicative of household ambulator     Posture / Balance Dynamic Sitting Balance Sitting balance - Comments: Pt with poor awareness of midline, needing repeated cues to tilt his head to L and lean more to L to find and maintain midline, min guard. Balance Overall balance assessment: Needs assistance Sitting-balance support: Bilateral upper extremity supported,Feet supported Sitting balance-[name]: Poor Sitting balance - Comments: Pt with poor awareness of midline, needing repeated cues to tilt his head to L and lean more to L to find and maintain midline, min guard. Standing balance support: Bilateral upper extremity supported,During functional activity Standing balance-[name]: Poor Standing balance comment: R lateral lean and bias. Pt reliant on bil UE support and external assist.     Special needs/care consideration Continuous Drip IV  Rocephin 280ml/hr q 12 hrs, Skin surgical incision to head and L hip (healing)  and Diabetic management yes        Previous Home Environment (from acute therapy documentation) Living Arrangements: Spouse/significant other (reports wife) Available Help at Discharge: Family,Available 24 hours/day Type of Home: House Home Layout: One level Home Access: Stairs to enter Entrance Stairs-Rails: None  Entrance Stairs-Number of Steps: (side of house) 1 step then laundry room then 2 steps to main level Bathroom Shower/Tub: Walk-in shower,Tub/shower Personal assistant: Standard Home Care Services: No Additional Comments: reports bad experience at SNF and refusing to consider transfer to SNF again. pt states It will take me longer but im going home.pt state "ive done more here already then i did the whole time there. no one got me up"   Discharge Living Setting Plans for Discharge Living Setting: Patient's home Type of Home at Discharge:  House Discharge Home Layout: One level Discharge Home Access: Stairs to enter Entrance Stairs-Rails: None Entrance Stairs-Number of Steps: 1+2 Discharge Bathroom Shower/Tub: Walk-in shower Discharge Bathroom Toilet: Standard Discharge Bathroom Accessibility: No Does the patient have any problems obtaining your medications?: No   Social/Family/Support Systems Patient Roles: Spouse Anticipated Caregiver: spouse, Nuno Brubacher Anticipated Caregiver's Contact Information: [phone]; [phone] Ability/Limitations of Caregiver: min assist for ADLs, supervision for mobility Caregiver Availability: 24/7 Discharge Plan Discussed with Primary Caregiver: Yes Is Caregiver In Agreement with Plan?: Yes Does Caregiver/Family have Issues with Lodging/Transportation while Pt is in Rehab?: No     Goals Patient/Family Goal for Rehab: PT/OT/SLP supervision to min assist Expected length of stay: 18-21 days Additional Information: previous stay in SNF and does not want to return Pt/Family Agrees to Admission and willing to participate: Yes Program Orientation Provided & Reviewed with Pt/Caregiver Including Roles  & Responsibilities: Yes  Barriers to Discharge: Inaccessible home environment     Decrease burden of Care through IP rehab admission: n/a   Possible need for SNF placement upon discharge: Not anticipated, though possible if patient does not progress his mobility to min guard/supervision level.      Patient Condition: This patient's condition remains as documented in the consult dated 11/11/2020, in which the Rehabilitation Physician determined and documented that the patient's condition is appropriate for intensive rehabilitative care in an inpatient rehabilitation facility. Will admit to inpatient rehab today.   Preadmission Screen Completed By:  Michel Santee, PT, DPT 11/12/2020 10:29 AM ______________________________________________________________________   Discussed status with Dr.  Dagoberto Ligas on 11/12/20 at  10:51 AM  and received approval for admission today.   Admission Coordinator:  Michel Santee, PT, DPT time 10:51 AM Sudie Grumbling 11/12/20              Cosigned by: Courtney Heys, MD at 11/12/2020 10:53 AM

## 2020-11-12 NOTE — H&P (Signed)
Physical Medicine and Rehabilitation Admission H&P    CC: Functional deficits.   HPI: Colton Bush is a 65 year old male with history of T2DM (Dr. Bubba Camp), HTN, SCI, pancreatic cancer with recurrence and recent admission 10/18/20 for right hip fracture s/p ORIF complicated by UTI/strep bacteremia and urinary retention who was d/c to [facility] for rehab. He was readmitted from SNF on 11/05/20 with SIRS and was found to have multiple liver abscesses.  He underwent IR guided aspiration with drain placement revealing strep anginagnosis and Urine culture positive for E coli. Unasyn was recommended by Dr. Tommy Medal.  He continued to have issues with confusion and MRI brain done revealing suspected areas of acute ischemia in left temporal and parietal lobe but with diffusion changes.  CT head revealed late subacute or chronic SDH measuring 10 mm in thickness.  On 04/11, he developed seizures and was loaded with Keppra.  Swallow evaluation done revealing mild to moderate oropharyngeal dysphagia in part due to cognition and aspiration precautions recommended with  Dr. Venetia Constable was consulted for input due to concerns of subdural hemorrhage versus empyema and he was taken to the OR on 04/12 for bur hole evacuation. His antibiotics were changed to metronidazole and ceftriaxone on 04/12 as liver aspiration positive for Klebsiella and E. Coli and concerns of infected SDH. Postop has had significant improvement in speech and mentation and Dr. Leonel Ramsay recommends continuing current dose Keppra and following up with neurology on outpatient basis for wean. Foley was removed this am but he had problems with retention and was cathed for 1300 cc this am therefore foley was replaced. Therapy ongoing and patient continues to be limited  CIR recommended due to functional deficits.     Pt reports had a BM this AM; getting Foley placed right now from nursing, because was removed yesterday- was requiring in/out caths  and last cath for 1300cc-  C/O low back pain which is new- feels like stretching too much/tight.  Denies neck pain.  FYI- they called Urology due to urinary retention.   Review of Systems  Constitutional: Negative for chills and fever.  HENT: Negative for hearing loss.   Eyes: Negative for blurred vision and double vision.  Respiratory: Negative for cough, hemoptysis and stridor.   Cardiovascular: Negative for chest pain and palpitations.  Gastrointestinal: Negative for abdominal pain and heartburn.  Genitourinary:       Urinary retention  Musculoskeletal: Positive for myalgias.  Skin: Negative for rash.  Neurological: Positive for weakness and headaches. Negative for dizziness.  Psychiatric/Behavioral: Positive for memory loss. The patient is nervous/anxious.   All other systems reviewed and are negative.    Past Medical History:  Diagnosis Date  . Arthritis    left hand  . Bronchitis 1977  . Cancer ([place]) 03/09/2016   pancreatic cancer, sees Dr. Cristino Martes at [facility]   . Depression    takes Cymbalta daily  . Diabetes mellitus type II    sees Dr. Chalmers Cater   . GERD (gastroesophageal reflux disease)    takes Omeprazole daily  . H/O hiatal hernia   . Hyperlipidemia    takes Zocor daily  . Hypertension    takes Amlodipine daily  . Hypoglycemia 06/18/2017  . Neck pain    C4-7 stenosis and herniated disc  . Neuromuscular disorder ([place])    hiatal hernia  . Scoliosis    slight  . Spinal cord injury, C5-C7 ([place])    c4-c7  . Stiffness of hand joint  d/t cervical issues    Past Surgical History:  Procedure Laterality Date  . ANTERIOR CERVICAL DECOMP/DISCECTOMY FUSION  08/18/2011   Procedure: ANTERIOR CERVICAL DECOMPRESSION/DISCECTOMY FUSION 3 LEVELS;  Surgeon: Winfield Cunas, MD;  Location: [facility];  Service: Neurosurgery;  Laterality: N/A;  Anterior Cervical Four-Five/Five-Six/Six-Seven Decompression with Fusion, Plating, and Bonegraft  . BURR HOLE Left 11/09/2020    Procedure: LEFT BURR HOLES FOR EVACUATION OF Subdural Hematoma;  Surgeon: Judith Part, MD;  Location: [facility];  Service: Neurosurgery;  Laterality: Left;  . CARPAL TUNNEL RELEASE  2013   bilateral, per Dr. Christella Noa   . COLONOSCOPY  10-30-14   per Dr. Olevia Perches, clear, repeat in 10 yrs   . egd with esophageal dilation  9-08   per Dr. Olevia Perches  . ERCP N/A 03/01/2016   Procedure: ENDOSCOPIC RETROGRADE CHOLANGIOPANCREATOGRAPHY (ERCP) with brushings and stent;  Surgeon: Doran Stabler, MD;  Location: [facility];  Service: Endoscopy;  Laterality: N/A;  . ESOPHAGOGASTRODUODENOSCOPY (EGD) WITH PROPOFOL N/A 05/27/2020   Procedure: ESOPHAGOGASTRODUODENOSCOPY (EGD) WITH PROPOFOL;  Surgeon: Milus Banister, MD;  Location: [facility];  Service: Endoscopy;  Laterality: N/A;  . EUS N/A 03/09/2016   Procedure: ESOPHAGEAL ENDOSCOPIC ULTRASOUND (EUS) RADIAL;  Surgeon: Milus Banister, MD;  Location: [facility];  Service: Endoscopy;  Laterality: N/A;  . EUS N/A 05/27/2020   Procedure: UPPER ENDOSCOPIC ULTRASOUND (EUS) RADIAL;  Surgeon: Milus Banister, MD;  Location: [facility];  Service: Endoscopy;  Laterality: N/A;  . FEMUR IM NAIL Right 10/25/2020   Procedure: INTRAMEDULLARY (IM) NAIL FEMORAL;  Surgeon: Rod Can, MD;  Location: [facility];  Service: Orthopedics;  Laterality: Right;  . lymph nodes biopsy    . melanoma rt calf  1999  . PORT-A-CATH REMOVAL N/A 04/03/2019   Procedure: PORT REMOVAL;  Surgeon: Stark Klein, MD;  Location: [facility];  Service: General;  Laterality: N/A;  . PORTACATH PLACEMENT Left 03/22/2016   Procedure: INSERTION PORT-A-CATH;  Surgeon: Stark Klein, MD;  Location: [facility];  Service: General;  Laterality: Left;  . SPINE SURGERY    . TONSILLECTOMY     as a child  . ULNAR TUNNEL RELEASE  2013   right arm, per Dr. Christella Noa   . UPPER GASTROINTESTINAL ENDOSCOPY    . WHIPPLE PROCEDURE N/A 09/19/2016   Procedure: DIAGNOSTIC LAPAROSCOPY, LAPAROSCOPIC LIVER BIOPSY, RETROPERITONEAL  EXPLORATION, INTRAOPERATIVE ULTRASOUND;  Surgeon: Stark Klein, MD;  Location: [facility];  Service: General;  Laterality: N/A;    Family History  Problem Relation Age of Onset  . Heart disease Father   . Heart disease Brother 59  . Anesthesia problems Mother   . Heart disease Mother   . Dementia Mother   . Diabetes Sister   . Stroke Sister   . Colon cancer Neg Hx   . Rectal cancer Neg Hx   . Stomach cancer Neg Hx     Social History:  Married. Retired in 2020 after cancer diagnosis.  Independent prior to hip fracture. reports that he has never smoked. He has never used smokeless tobacco. He reports that he does not drink alcohol and does not use drugs.    Allergies: No Known Allergies    Medications Prior to Admission  Medication Sig Dispense Refill  . cefTRIAXone (ROCEPHIN) IVPB Inject 2 g into the vein every 12 (twelve) hours. Indication:  liver abscess and possible infected subdural hematoma First Dose: No Last Day of Therapy:  12/21/20 Labs - Once weekly:  CBC/D and BMP, Labs -  Every other week:  ESR and CRP Method of administration: IV Push Method of administration may be changed at the discretion of home infusion pharmacist based upon assessment of the patient and/or caregiver's ability to self-administer the medication ordered. 84 Units 0  . CREON 6000-19000 units CPEP Take 1 capsule by mouth with breakfast, with lunch, and with evening meal.     . [START ON 11/13/2020] enoxaparin (LOVENOX) 40 MG/0.4ML injection Inject 0.4 mLs (40 mg total) into the skin daily. 0 mL   . ferrous sulfate 325 (65 FE) MG tablet Take 325 mg daily with breakfast by mouth.    . insulin detemir (LEVEMIR) 100 UNIT/ML injection Inject 0.1 mLs (10 Units total) into the skin 2 (two) times daily. 10 mL 11  . levETIRAcetam (KEPPRA) 1000 MG tablet Take 1 tablet (1,000 mg total) by mouth 2 (two) times daily.    Marland Kitchen loratadine (CLARITIN) 10 MG tablet Take 10 mg by mouth daily.    . metroNIDAZOLE (FLAGYL) 500 MG  tablet Take 1 tablet (500 mg total) by mouth 3 (three) times daily. Treat through 12/21/20  0  . Multiple Vitamin (MULTIVITAMIN WITH MINERALS) TABS tablet Take 1 tablet by mouth daily.    Marland Kitchen NOVOLOG FLEXPEN 100 UNIT/ML FlexPen GIVE EVERY MORNING WITH BREAKFAST AND EVERY EVENING WITH SUPPER PER SLIDING SCALE (Patient taking differently: Inject 2-13 Units into the skin 2 (two) times daily. Sliding Scale : 101-150 - 2u 151-200 - 3u 201-250 - 5u 251-300 - 7u 301-350 - 9u 351- 400 - 11u 401-450 - 13u Greater than 150, alert MD) 3 mL 0  . Omega-3 Fatty Acids (FISH OIL) 500 MG CAPS Take 500 mg by mouth daily.    Marland Kitchen omeprazole (PRILOSEC) 20 MG capsule TAKE 1 CAPSULE DAILY (Patient taking differently: Take 20 mg by mouth daily.) 90 capsule 3  . polyethylene glycol (MIRALAX / GLYCOLAX) 17 g packet Take 17 g by mouth daily as needed for mild constipation. 14 each 0  . rosuvastatin (CRESTOR) 20 MG tablet Take 1 tablet (20 mg total) by mouth daily. 90 tablet 3  . tamsulosin (FLOMAX) 0.4 MG CAPS capsule Take 1 capsule (0.4 mg total) by mouth daily. 30 capsule     Drug Regimen Review  Drug regimen was reviewed and remains appropriate with no significant issues identified  Home:     Functional History:    Functional Status:  Mobility:          ADL:    Cognition:      Physical Exam: There were no vitals taken for this visit. Physical Exam Vitals and nursing note reviewed. Exam conducted with a chaperone present.  Constitutional:      Appearance: He is normal weight. He is ill-appearing.     Comments: Awake, alert, laying supine in bed; getting foley placed; NT and 2 nurses in room, NAD- foley placement went OK- uncomfortable  HENT:     Head: Normocephalic.     Comments: Left crani incision C/D/I and healing well L frontal area- crani- looks good Smile equal; tongue midline    Right Ear: External ear normal.     Left Ear: External ear normal.     Mouth/Throat:     Mouth: Mucous  membranes are dry.     Pharynx: Oropharynx is clear. No oropharyngeal exudate.  Eyes:     General:        Right eye: No discharge.        Left eye: No discharge.  Extraocular Movements: Extraocular movements intact.  Cardiovascular:     Rate and Rhythm: Normal rate and regular rhythm.     Heart sounds: Normal heart sounds. No murmur heard. No gallop.   Pulmonary:     Comments: CTA B/L- no W/R/R- good air movement Abdominal:     General: There is no distension.     Comments: Well healed old midline incision. RUQ drains in place--one with bilious drainage and other with blood tinged drainage.  Soft, slightly TTP diffusely- no rebound; hypoactive BS  Genitourinary:    Penis: Normal.      Comments: Light amber urine in foley as it was placed under sterile conditions Musculoskeletal:     Cervical back: Normal range of motion and neck supple.     Comments: RLE with multiple small stapled incisions.   UE's 5-/5 but equal B/L  LE's - 4+/5 in HF, KE, DF and PF B/L   Skin:    General: Skin is warm and dry.     Comments: RLE incisions as well as abd incisions already noted L forearm/wrist IVs' look OK  Neurological:     Mental Status: He is oriented to person, place, and time.     Comments: Soft voice with mild left facial weakness. Slow to respond but able to follow basic motor commands.   Slightly perseverative on MS exam, neuro exam Slow to speak- word searching, but used appropriate words Intact to light touch in all 4 extremities  Psychiatric:     Comments: flat     Results for orders placed or performed during the hospital encounter of 11/05/20 (from the past 48 hour(s))  Glucose, capillary     Status: Abnormal   Collection Time: 11/10/20  7:23 PM  Result Value Ref Range   Glucose-Capillary 257 (H) 70 - 99 mg/dL    Comment: Glucose reference range applies only to samples taken after fasting for at least 8 hours.  Glucose, capillary     Status: Abnormal   Collection  Time: 11/10/20  9:26 PM  Result Value Ref Range   Glucose-Capillary 271 (H) 70 - 99 mg/dL    Comment: Glucose reference range applies only to samples taken after fasting for at least 8 hours.  Glucose, capillary     Status: Abnormal   Collection Time: 11/11/20  7:22 AM  Result Value Ref Range   Glucose-Capillary 113 (H) 70 - 99 mg/dL    Comment: Glucose reference range applies only to samples taken after fasting for at least 8 hours.  Glucose, capillary     Status: Abnormal   Collection Time: 11/11/20 11:14 AM  Result Value Ref Range   Glucose-Capillary 228 (H) 70 - 99 mg/dL    Comment: Glucose reference range applies only to samples taken after fasting for at least 8 hours.  Glucose, capillary     Status: Abnormal   Collection Time: 11/11/20  6:00 PM  Result Value Ref Range   Glucose-Capillary 214 (H) 70 - 99 mg/dL    Comment: Glucose reference range applies only to samples taken after fasting for at least 8 hours.  Glucose, capillary     Status: Abnormal   Collection Time: 11/11/20 11:00 PM  Result Value Ref Range   Glucose-Capillary 195 (H) 70 - 99 mg/dL    Comment: Glucose reference range applies only to samples taken after fasting for at least 8 hours.  Glucose, capillary     Status: Abnormal   Collection Time: 11/12/20  7:18 AM  Result  Value Ref Range   Glucose-Capillary 144 (H) 70 - 99 mg/dL    Comment: Glucose reference range applies only to samples taken after fasting for at least 8 hours.  Glucose, capillary     Status: Abnormal   Collection Time: 11/12/20 11:26 AM  Result Value Ref Range   Glucose-Capillary 266 (H) 70 - 99 mg/dL    Comment: Glucose reference range applies only to samples taken after fasting for at least 8 hours.  Glucose, capillary     Status: Abnormal   Collection Time: 11/12/20  4:23 PM  Result Value Ref Range   Glucose-Capillary 228 (H) 70 - 99 mg/dL    Comment: Glucose reference range applies only to samples taken after fasting for at least 8  hours.   No results found.     Medical Problem List and Plan: 1.  L frontal craniotomy secondary to LDH/SAH from fall  -patient may  Shower if incisions covered  -ELOS/Goals: 2-3 weeks- mod I to supervision 2.  Antithrombotics: -DVT/anticoagulation:  Pharmaceutical: Lovenox  -antiplatelet therapy: N/a 3. Pain Management: tylenol prn 4. Mood: LCSW to follow for evaluation and support.   -antipsychotic agents: N/A 5. Neuropsych: This patient is capable of making decisions on his own behalf. 6. Multiple deep tissue injuries/Skin/Wound Care: Air mattress overlay with pressure relief measures.   --Will add vitamins and collagen to promote wound healing  7. Fluids/Electrolytes/Nutrition: Monitor I/O. Check lytes in am. 8.  SDH (concerns of bacterial infection): S/p Burr hole evacuation   9. Strep anginosis bacteremia: Continue Ceftriaxone/Metronidazole X 6 weeks w/end date  10 Liver abscess s/p JP drain placement: Continue flushes TID and document output. . 11. New onset seizures: To continue Keppra bid till follow up with neuro on outpatient basis.  12. Left hip ORIF: WBAT 13.  T2DM: Hgb A1C- 10.8. Will monitor BS ac/hs. Home regimen Levemir 10u am/15u pm with SSI.   --continue Levemir bid with SSI and titrate insulin as indicated. 14. BPH/Urinary retention: Foley replaced--keep in place for decompression for few days than start bladder program.   --will increase flomax to 0.8 mg/HS   15. Pancreatic cancer with recurrence: Continue Creon with meals.  --follow up with Hem/onc and Radiation Onc after discharge.  16/ Low back pain/muscle spasms- will start Robaxin prn and lidoderm patches 1- 8pm to 8am.    Ivan Anchors. Hemmer- PA-C 11/12/2020    I have personally performed a face to face diagnostic evaluation of this patient and formulated the key components of the plan.  Additionally, I have personally reviewed laboratory data, imaging studies, as well as relevant notes and concur with  the physician assistant's documentation above.   The patient's status has not changed from the original H&P.  Any changes in documentation from the acute care chart have been noted above.     Courtney Heys, MD 11/12/2020

## 2020-11-12 NOTE — Progress Notes (Addendum)
Inpatient Rehab Admissions:  Inpatient Rehab Consult received.  I met with patient at the bedside for rehabilitation assessment and to discuss goals and expectations of an inpatient rehab admission.  I also spoke to his wife over the phone.  We discussed expected length of stay to be about 2 weeks, and goals of supervision to min assist.  Both pt and his spouse have a goal for pt to be able to walk again, and I think this is a feasible goal.  I do have a bed available for Colton Bush to admit to CIR today, so will touch base with his attending physician to see if he is ready to transfer.    1052: Dr. Rizwan in agreement for pt to admit to CIR today.  Will let pt/family and TOC team know.   Signed:  , PT, DPT Admissions Coordinator [phone] 11/12/20  10:20 AM    

## 2020-11-12 NOTE — Progress Notes (Signed)
Inpatient Diabetes Program Recommendations  AACE/ADA: New Consensus Statement on Inpatient Glycemic Control   Target Ranges:  Prepandial:   less than 140 mg/dL      Peak postprandial:   less than 180 mg/dL (1-2 hours)      Critically ill patients:  140 - 180 mg/dL   Results for YEHUDA, PRINTUPMIKE" (MRN [id number]) as of 11/12/2020 07:40  Ref. Range 11/11/2020 07:22 11/11/2020 11:14 11/11/2020 18:00 11/11/2020 23:00 11/12/2020 07:18  Glucose-Capillary Latest Ref Range: 70 - 99 mg/dL 113 (H) 228 (H) 214 (H) 195 (H) 144 (H)   Review of Glycemic Control  Current orders for Inpatient glycemic control: Levemir 10 units BID, Novolog 0-15 units TID with meals, Novolog 0-5 units QHS  Inpatient Diabetes Program Recommendations:    Insulin: Please consider ordering Novolog 3 units TID with meals for meal coverage if patient eats at least 50% of meals.  Thanks, Barnie Alderman, RN, MSN, CDE Diabetes Coordinator Inpatient Diabetes Program [phone] (Team Pager from 8am to 5pm)

## 2020-11-12 NOTE — Discharge Summary (Signed)
Physician Discharge Summary  Colton Bush YOV:[id number] DOB: 08-03-1955 DOA: 11/05/2020  PCP: Wenda Low, MD  Admit date: 11/05/2020 Discharge date: 11/12/2020  Admitted From: home Disposition:  CIR followed by home with home health   Recommendations for Outpatient Follow-up:  1. Needs urology f/u for urinary retention 2. Home health arranged for outpatient antibiotics after [place]:  ordered  Discharge Condition:  stable   CODE STATUS:  Full code   Diet recommendation:  Heart healthy, diabetic diet. Consultants:   ID  Neuro  NS Procedures:   LUQ (liver)  Drains  Burr holes  Discharge Diagnoses:  Principal Problem:   Sepsis ([place]) Active Problems:   Liver abscess   Subdural hematoma (H[facility])   Seizure in setting of subdural hematoma   Toxic encephalopathy   Essential hypertension   ESOPHAGEAL STRICTURE   GERD without esophagitis   Mixed diabetic hyperlipidemia associated with type 2 diabetes mellitus (HCC)   Obstructive jaundice due to malignant neoplasm ([place])   DKA (diabetic ketoacidosis) ([place])   Adenocarcinoma of head of pancreas ([place])   Uncontrolled type 2 diabetes mellitus with ketoacidosis without coma, with long-term current use of insulin (HCC)   Increased anion gap metabolic acidosis   Chronic diastolic CHF (congestive heart failure) (HCC)   Pneumonia of both lower lobes due to infectious organism   Bladder outlet obstruction   Atrophic pancreas     Brief Summary: Colton Bush is a 65 year old male with recurrent pancreatic cancer, diabetes mellitus type 2, chronic diastolic heart failure (grade 1), recent right hip fracture status post IM nail, hypertension and hyperlipidemia. His most recent admission was from 3/21-3/31 for hip fracture complicated by e coli UTI and strep anginosis bacteremia.  The patient presented from [facility] on 4/8 for acute onset confusion and was found to have DKA and sepsis (fever 102.9, tachycardia,  leukocytosis).  Subsequently found to have liver abscesses on CT scans.  4/9> IR aspiration and then drains placed.  On 4/10 an MRI of the brain was obtained due to ongoing confusion and increased lethargy and he was found to have an age-indeterminate mixed left subarachnoid and subdural hematoma measuring 10 mm in thickness.  A head CT was recommended for better characterization.  Also noted were areas of suspected acute ischemia in the left temporal and parietal lobes.  That same night, on the way to the CT of the head, he was noted to have 2 seizure-like episodes and neurology was consulted.  CT head>Left holohemispheric subdural collection measuring 10 mm inthickness with mild mass effect on the left hemisphere and only trace midline shift. This is most consistent with a late subacute or chronic subdural hematoma."  He was loaded with Keppra which resulted in cessation of the seizure-like activity.   Neuro consulted> loaded with Keppra NS phone consult > felt the hematoma was 4-6 wks old and did not feel intervention would help.  4/11> He was transferred to [facility] for a continuous EEG and NS consult.  On 4/12>  formally evaluated by neurosurgery and there was concern that he may have a subdural empyema rather than a hematoma.    - underwent burr hold evacuation > contents "appeared to be mostly chronic blood with some membranes, no evidence of purulent material."  Hospital Course:  Principal Problem: Severe Sepsis Liver abscess status post JP drain x2 Bilateral lower lobe pulmonary infiltrates Subdural hematoma - he was admitted to [facility] on 3/21 when he fell and sustained a hip fx-  he developed a fever of 102 degrees- grew e coli from his urine and strep agninosis from 1/4  blood cultures- he was treated with Ceftriaxone and then Amoxil for a total of 7 days  - [facility] imaging of chest abdomen pelvis incidentally found hepatic abscesses and b/l pulmonary, lower lobe infiltrates - 4/8  admitted for AMS> urine culture > 60,000 e coli - 4/9> IR guided aspiration of liver abscesses and 2 drains places- 2 cultures from hepatic drain growing K pneumoniae & E coli (in one) and e coli and strep anginosis (in other)- all sensitive to Ceftriaxone -ID recommends ceftriaxone (which crosses the blood brain barrier in case he has an infected subdural hematoma) and oral metronidazole for total of 6 weeks -ID plans to follow-up prior to stopping the antibiotics and obtain reimaging of his liver to make a decision on whether to stop antibiotics    Active Problems: Acute toxic encephalopathy -Secondary to infection, possible seizures and subdural hematoma  - much improved. Dysarthria has resolved. He is cognitively intact and asking appropriate questions today.   Subdural hematoma -Question if this is infected-status post bur holes on 4/12 - see above  Probable seizures -Continue Keppra per neurology -EEG 4/12: Suggestive of cortical dysfunction arising from the temporal region-no seizure or definite epileptiform discharges  Uncontrolled diabetes mellitus with hyperglycemia-presented with DKA -Hemoglobin A1c on 3/21 was 10.8 -Was noted to be hypoglycemic with CBG of 68 on 4/12 (was n.p.o.) -CBGs have been in the 300s since yesterday -He was taking 10 units of Levemir in the morning and 15 in the evening at home with NovoLog sliding scale with meals -/12> increased Lantus to 10 units twice daily and Changed sliding scale to 3 times daily with meals moderate dose - continue current doses  Urinary retention/ BOO - noted on last admission- he is on Flomax - Foley removed yesterday by RN due to neuro ICU protocol - Patient did not urinate nor did he feel the urge to urinate - today, RN (did an I and O cath per protocol as well) and 1300 cc urine obtained   - we are putting a foley cath back in now  - he will to continue this foley and will need a f/u with Urology as outpatient     Adenocarcinoma of head of pancreas in 2/22  Atrophic pancreas -Status post Whipple's procedure chemo and radiation -His current recurrence is non resectable - radiation was planned but has not been able to be started due to his hip fracture and his current issues - CT shows that his remaining pancreas is atrophic -He is on Creon replacement with meals    Essential hypertension -Amlodipine & benazepril on hold-BP is not elevated    GERD without esophagitis -Continue PPI    Mixed diabetic hyperlipidemia associated with type 2 diabetes mellitus  -Continue statin   Recent hip fracture -Status post IM nail -Continue PT OT  -Continue wound care recommendations for below injuries Pressure Injury 11/07/20 Heel Right Stage 1 -  Intact skin with non-blanchable redness of a localized area usually over a bony prominence. (Active)  11/07/20 0800  Location: Heel  Location Orientation: Right  Staging: Stage 1 -  Intact skin with non-blanchable redness of a localized area usually over a bony prominence.  Wound Description (Comments):   Present on Admission:      Pressure Injury 11/07/20 Sacrum Stage 1 -  Intact skin with non-blanchable redness of a localized area usually over a bony prominence. (Active)  11/07/20 2000  Location: Sacrum  Location Orientation:   Staging: Stage 1 -  Intact skin with non-blanchable redness of a localized area usually over a bony prominence.  Wound Description (Comments):   Present on Admission:      Pressure Injury 11/07/20 Heel Right Deep Tissue Pressure Injury - Purple or maroon localized area of discolored intact skin or blood-filled blister due to damage of underlying soft tissue from pressure and/or shear. (Active)  11/07/20 2000  Location: Heel  Location Orientation: Right  Staging: Deep Tissue Pressure Injury - Purple or maroon localized area of discolored intact skin or blood-filled blister due to damage of underlying soft tissue from pressure  and/or shear.  Wound Description (Comments):   Present on Admission:      Pressure Injury 11/07/20 Sacrum Deep Tissue Pressure Injury - Purple or maroon localized area of discolored intact skin or blood-filled blister due to damage of underlying soft tissue from pressure and/or shear. (Active)  11/07/20 2000  Location: Sacrum  Location Orientation:   Staging: Deep Tissue Pressure Injury - Purple or maroon localized area of discolored intact skin or blood-filled blister due to damage of underlying soft tissue from pressure and/or shear.  Wound Description (Comments):   Present on Admission:         Discharge Exam: Vitals:   11/12/20 0901 11/12/20 1000  BP:  123/62  Pulse:  78  Resp:  (!) 22  Temp:    SpO2: 93% 93%   Vitals:   11/12/20 0800 11/12/20 0900 11/12/20 0901 11/12/20 1000  BP: 123/62 (!) 116/59  123/62  Pulse: 92 81  78  Resp: (!) 24 17  (!) 22  Temp: 97.9 F (36.6 C)     TempSrc: Oral     SpO2: 93%  93% 93%  Weight:      Height:        General: Pt is alert, awake, not in acute distress Cardiovascular: RRR, S1/S2 +, no rubs, no gallops Respiratory: CTA bilaterally, no wheezing, no rhonchi Abdominal: Soft, NT, ND, bowel sounds + Extremities: no edema, no cyanosis   Discharge Instructions  Discharge Instructions    [facility] pharmacist to adjust dose for Vancomycin, Aminoglycosides and other anti-infective therapies as requested by physician.   Complete by: As directed    [facility] to provide Cath Flo 77m   Complete by: As directed    Administer for PICC line occlusion and as ordered by physician for other access device issues.   Anaphylaxis Kit: Provided to treat any anaphylactic reaction to the medication being provided to the patient if First Dose or when requested by physician   Complete by: As directed    Epinephrine 12mml vial / amp: Administer 0.3m82m0.3ml76mubcutaneously once for moderate to severe anaphylaxis, nurse  to call physician and pharmacy when reaction occurs and call 911 if needed for immediate care   Diphenhydramine 50mg75mIV vial: Administer 25-50mg 9mM PRN for first dose reaction, rash, itching, mild reaction, nurse to call physician and pharmacy when reaction occurs   Sodium Chloride 0.9% NS 500ml I59mdminister if needed for hypovolemic blood pressure drop or as ordered by physician after call to physician with anaphylactic reaction   Change dressing on IV access line weekly and PRN   Complete by: As directed    Flush IV access with Sodium Chloride 0.9% and Heparin 10 units/ml or 100 units/ml   Complete by: As directed    Home infusion instructions - [facility]  Complete by: As directed    Instructions: Flush IV access with Sodium Chloride 0.9% and Heparin 10units/ml or 100units/ml   Change dressing on IV access line: Weekly and PRN   Instructions Cath Flo 69m: Administer for PICC Line occlusion and as ordered by physician for other access device   [facility] pharmacist to adjust dose for: Vancomycin, Aminoglycosides and other anti-infective therapies as requested by physician   Increase activity slowly   Complete by: As directed    Method of administration may be changed at the discretion of home infusion pharmacist based upon assessment of the patient and/or caregiver's ability to self-administer the medication ordered   Complete by: As directed    No wound care   Complete by: As directed      Allergies as of 11/12/2020   No Known Allergies     Medication List    STOP taking these medications   acetaminophen 500 MG tablet Commonly known as: TYLENOL   amLODipine 10 MG tablet Commonly known as: NORVASC   apixaban 2.5 MG Tabs tablet Commonly known as: ELIQUIS   B-D UF III MINI PEN NEEDLES 31G X 5 MM Misc Generic drug: Insulin Pen Needle   benazepril 10 MG tablet Commonly known as: LOTENSIN   Dermacloud Oint   diphenhydrAMINE HCl (Sleep) 25 MG  Tbdp   docusate sodium 100 MG capsule Commonly known as: COLACE   freestyle lancets   FREESTYLE LITE test strip Generic drug: glucose blood   Insulin Pen Needle 30G X 5 MM Misc   melatonin 3 MG Tabs tablet   oxyCODONE 5 MG immediate release tablet Commonly known as: Oxy IR/ROXICODONE   potassium chloride SA 20 MEQ tablet Commonly known as: Klor-Con M20     TAKE these medications   cefTRIAXone  IVPB Commonly known as: ROCEPHIN Inject 2 g into the vein every 12 (twelve) hours. Indication:  liver abscess and possible infected subdural hematoma First Dose: No Last Day of Therapy:  12/21/20 Labs - Once weekly:  CBC/D and BMP, Labs - Every other week:  ESR and CRP Method of administration: IV Push Method of administration may be changed at the discretion of home infusion pharmacist based upon assessment of the patient and/or caregiver's ability to self-administer the medication ordered.   Creon 6000-19000 units Cpep Generic drug: Pancrelipase (Lip-Prot-Amyl) Take 1 capsule by mouth with breakfast, with lunch, and with evening meal.   enoxaparin 40 MG/0.4ML injection Commonly known as: LOVENOX Inject 0.4 mLs (40 mg total) into the skin daily. Start taking on: November 13, 2020   ferrous sulfate 325 (65 FE) MG tablet Take 325 mg daily with breakfast by mouth.   Fish Oil 500 MG Caps Take 500 mg by mouth daily.   insulin detemir 100 UNIT/ML injection Commonly known as: LEVEMIR Inject 0.1 mLs (10 Units total) into the skin 2 (two) times daily. What changed:   how much to take  when to take this  additional instructions   levETIRAcetam 1000 MG tablet Commonly known as: KEPPRA Take 1 tablet (1,000 mg total) by mouth 2 (two) times daily.   loratadine 10 MG tablet Commonly known as: CLARITIN Take 10 mg by mouth daily.   metroNIDAZOLE 500 MG tablet Commonly known as: Flagyl Take 1 tablet (500 mg total) by mouth 3 (three) times daily. Treat through 12/21/20    multivitamin with minerals Tabs tablet Take 1 tablet by mouth daily.   NovoLOG FlexPen 100 UNIT/ML FlexPen Generic drug: insulin aspart GIVE EVERY MORNING  WITH BREAKFAST AND EVERY EVENING WITH SUPPER PER SLIDING SCALE What changed: See the new instructions.   omeprazole 20 MG capsule Commonly known as: PRILOSEC TAKE 1 CAPSULE DAILY What changed:   how much to take  how to take this  when to take this  additional instructions   polyethylene glycol 17 g packet Commonly known as: MIRALAX / GLYCOLAX Take 17 g by mouth daily as needed for mild constipation.   rosuvastatin 20 MG tablet Commonly known as: Crestor Take 1 tablet (20 mg total) by mouth daily.   tamsulosin 0.4 MG Caps capsule Commonly known as: FLOMAX Take 1 capsule (0.4 mg total) by mouth daily.            Discharge Care Instructions  (From admission, onward)         Start     Ordered   11/12/20 0000  Change dressing on IV access line weekly and PRN  (Home infusion instructions - [facility] )        11/12/20 1059          No Known Allergies    DG Chest 1 View  Result Date: 10/18/2020 CLINICAL DATA:  Fall, pain and RIGHT hip. EXAM: CHEST  1 VIEW COMPARISON:  June 19, 2017 FINDINGS: Trachea midline. Cardiomediastinal contours and hilar structures are normal. Signs of atelectasis and scarring at the lung bases. Evidence of cervical spinal fusion partially imaged on the current study. On limited assessment no acute skeletal process. IMPRESSION: Signs of atelectasis and scarring at the lung bases. Electronically Signed   By: Zetta Bills M.D.   On: 10/18/2020 19:12   CT HEAD WO CONTRAST  Result Date: 11/07/2020 CLINICAL DATA:  Subdural hematoma EXAM: CT HEAD WITHOUT CONTRAST TECHNIQUE: Contiguous axial images were obtained from the base of the skull through the vertex without intravenous contrast. COMPARISON:  Brain MRI same day FINDINGS: Brain: Left holohemispheric subdural  collection is diffusely hypodense and measures 10 mm in thickness. Mass effect on the left hemisphere with only trace midline shift. No hydrocephalus. No intraparenchymal hemorrhage. Vascular: No hyperdense vessel or unexpected calcification. Skull: Normal. Negative for fracture or focal lesion. Sinuses/Orbits: No acute finding. Other: None. IMPRESSION: Left holohemispheric subdural collection measuring 10 mm in thickness with mild mass effect on the left hemisphere and only trace midline shift. This is most consistent with a late subacute or chronic subdural hematoma. Electronically Signed   By: Ulyses Jarred M.D.   On: 11/07/2020 21:58   MR BRAIN WO CONTRAST  Addendum Date: 11/07/2020   ADDENDUM REPORT: 11/07/2020 20:54 ADDENDUM: Critical Value/emergent results were called by telephone at the time of interpretation on 11/07/2020 at 8:54 pm to provider Lovey Newcomer , who verbally acknowledged these results. Electronically Signed   By: Ulyses Jarred M.D.   On: 11/07/2020 20:54   Result Date: 11/07/2020 CLINICAL DATA:  Altered mental status EXAM: MRI HEAD WITHOUT CONTRAST TECHNIQUE: Multiplanar, multiecho pulse sequences of the brain and surrounding structures were obtained without intravenous contrast. COMPARISON:  None. FINDINGS: Brain: Left holo hemispheric extra-axial collection, likely hematoma, measuring 10 mm in thickness. Mild mass effect on the left hemisphere with minimal midline shift. There is some subarachnoid blood over the left frontal lobe. Areas of abnormal diffusion restriction in the left temporal and parietal lobes. No intraparenchymal hemorrhage. There is multifocal hyperintense T2-weighted signal within the white matter. Generalized volume loss without a clear lobar predilection. The midline structures are normal. Vascular: Major flow voids are preserved. Skull and  upper cervical spine: Normal calvarium and skull base. Visualized upper cervical spine and soft tissues are normal.  Sinuses/Orbits:No paranasal sinus fluid levels or advanced mucosal thickening. No mastoid or middle ear effusion. Normal orbits. IMPRESSION: 1. Age indeterminate, mixed left subarachnoid and subdural hematoma measuring 10 mm in thickness. Head CT recommended for better temporal characterization. 2. Areas of suspected acute ischemia in the left temporal and parietal lobes, though diffusion-weighted imaging is complicated by the adjacent blood products and their magnetic susceptibility effects. Electronically Signed: By: Ulyses Jarred M.D. On: 11/07/2020 20:33   CT CHEST ABDOMEN PELVIS W CONTRAST  Result Date: 11/06/2020 CLINICAL DATA:  Pyelonephritis.  Complicated sepsis. EXAM: CT CHEST, ABDOMEN, AND PELVIS WITH CONTRAST TECHNIQUE: Multidetector CT imaging of the chest, abdomen and pelvis was performed following the standard protocol during bolus administration of intravenous contrast. CONTRAST:  158m OMNIPAQUE IOHEXOL 300 MG/ML  SOLN COMPARISON:  Outside abdominal CT 10/14/2020 FINDINGS: CT CHEST FINDINGS Cardiovascular: Normal heart size. No pericardial effusion. Coronary atherosclerosis. Limited opacification of the pulmonary arteries. Mediastinum/Nodes: Negative for adenopathy or mass. Lungs/Pleura: Airspace opacity in the subpleural left lower lobe and patchy reticulonodular density in the bilateral lower lobes. No edema or effusion. No pneumothorax. Musculoskeletal: No acute finding.  Mild scoliosis CT ABDOMEN PELVIS FINDINGS Hepatobiliary: Sequela of Whipple procedure.There is soft tissue density at the liver hilum better demonstrated on prior pancreas protocol CT with arterial narrowings described on that study. Intrahepatic bile duct dilatation which is similar to previous. Left lobe of the liver is atrophic in the setting of chronic left portal vein occlusion and hepatic arterial narrowings. There is a cluster of new low-density areas in the posterior right lobe liver with the largest collection  measuring 4.6 cm. Mild hypo perfusion in the adjacent parenchyma. Pancreas: Status post Whipple procedure. Atrophy of the remaining pancreas. Soft tissue density around the celiac branches as described on outside CT staging. No acute pancreatitis is seen. Spleen: Negative Adrenals/Urinary Tract: Negative adrenals. No hydronephrosis or stone. Unremarkable bladder which is decompressed by a Foley catheter. Stomach/Bowel: No evidence of bowel obstruction. Sequela of local procedure. Rectal stool distention with mild mesorectal edema. The rectum measures up to 8 cm in diameter. Vascular/Lymphatic: Celiac branch narrowings and chronic left portal vein occlusion as described above. Atheromatous calcification. No discrete retroperitoneal nodes. Reproductive:No pathologic findings. Other: No ascites or pneumoperitoneum. Musculoskeletal: Intertrochanteric right femur fracture with recent repair. No acute osseous finding. Lumbar spine degeneration and scoliosis. Message was sent to the clinical team via secure chat in epic. IMPRESSION: 1. Cluster of bilomas/abscess in the posterior right lobe liver with the largest component measuring up to 4.6 cm. Bile duct dilatation is similar to recent staging scan 08/31/2020. 2. Whipple procedure for pancreas carcinoma with infiltrative soft tissue about the narrowed celiac branches and hepatic hilum, [facility]. 3. No CT findings to correlate with history of pyelonephritis. 4. Airspace disease in the bilateral lower lobes compatible with pneumonia or aspiration. Notable subpleural involvement in the left lower lobe is likely consolidation given the overall pattern but please have low threshold for lower extremity Doppler ultrasound given recent right femur fracture repair. 5. Moderate stool distended rectum with mesorectal edema. Electronically Signed   By: JMonte FantasiaM.D.   On: 11/06/2020 06:29   DG CHEST PORT 1 VIEW  Result  Date: 11/08/2020 CLINICAL DATA:  Dyspnea EXAM: PORTABLE CHEST 1 VIEW COMPARISON:  11/07/2020 FINDINGS: Mild right basilar atelectasis or infiltrate again noted, better appreciated  on prior CT examination of 11/06/2020. Mild right-sided volume loss is unchanged. No pneumothorax or pleural effusion. Cardiac size within normal limits. Pulmonary vascularity is normal. Pigtail catheter noted overlying the right upper quadrant. IMPRESSION: Stable right-sided volume loss and mild right basilar atelectasis or infiltrate. Electronically Signed   By: Fidela Salisbury MD   On: 11/08/2020 06:57   DG CHEST PORT 1 VIEW  Result Date: 11/07/2020 CLINICAL DATA:  Shortness of breath EXAM: PORTABLE CHEST 1 VIEW COMPARISON:  November 05, 2020 chest radiograph; chest CT November 06, 2020 FINDINGS: There is airspace opacity in each lung base. Lungs elsewhere clear. Heart size and pulmonary vascularity normal. No adenopathy. There is lower thoracic dextroscoliosis. There is postoperative change in the lower cervical spine. IMPRESSION: Patchy airspace opacity in the lung bases, similar to 1 day prior but increased compared to 2 days prior. Stable cardiac silhouette. No adenopathy evident. Electronically Signed   By: Lowella Grip III M.D.   On: 11/07/2020 07:58   DG Chest Port 1 View  Result Date: 11/05/2020 CLINICAL DATA:  Sepsis. EXAM: PORTABLE CHEST 1 VIEW COMPARISON:  Chest x-ray 10/18/2020 FINDINGS: The cardiac silhouette, mediastinal and hilar contours are within normal limits. The lungs are clear of an acute process. No pulmonary lesions or pleural effusion. The bony thorax is intact. IMPRESSION: No acute cardiopulmonary findings. Electronically Signed   By: Marijo Sanes M.D.   On: 11/05/2020 18:53   DG Knee Right Port  Result Date: 10/19/2020 CLINICAL DATA:  History of distal femur fracture. EXAM: PORTABLE RIGHT KNEE - 1-2 VIEW COMPARISON:  No prior. FINDINGS: Limited two view of exam obtained. Mild soft tissue swelling about  the knee cannot be excluded. Severe Tricompartment degenerative change with chondrocalcinosis. Deformity noted of the proximal fibula consistent with old healed fracture. No acute fracture identified. IMPRESSION: 1.  Mild soft tissue swelling about the knee cannot be excluded. 2. Severe tricompartment degenerative change with chondrocalcinosis. Deformity noted the proximal right fibula consistent with old healed fracture. No acute bony abnormality identified. Electronically Signed   By: Marcello Moores  Register   On: 10/19/2020 09:46   DG Swallowing Func-Speech Pathology  Result Date: 11/08/2020 Objective Swallowing Evaluation: Type of Study: MBS-Modified Barium Swallow Study  Patient Details Name: Colton Bush MRN: [id number] Date of Birth: July 30, 1956 Today's Date: 11/08/2020 Time: SLP Start Time (ACUTE ONLY): 3212 -SLP Stop Time (ACUTE ONLY): 1055 SLP Time Calculation (min) (ACUTE ONLY): 15 min Past Medical History: Past Medical History: Diagnosis Date . Arthritis   left hand . Bronchitis 1977 . Cancer ([place]) 03/09/2016  pancreatic cancer, sees Dr. Cristino Martes at [facility]  . Depression   takes Cymbalta daily . Diabetes mellitus type II   sees Dr. Chalmers Cater  . GERD (gastroesophageal reflux disease)   takes Omeprazole daily . H/O hiatal hernia  . Hyperlipidemia   takes Zocor daily . Hypertension   takes Amlodipine daily . Hypoglycemia 06/18/2017 . Neck pain   C4-7 stenosis and herniated disc . Neuromuscular disorder ([place])   hiatal hernia . Scoliosis   slight . Spinal cord injury, C5-C7 ([place])   c4-c7 . Stiffness of hand joint   d/t cervical issues Past Surgical History: Past Surgical History: Procedure Laterality Date . ANTERIOR CERVICAL DECOMP/DISCECTOMY FUSION  08/18/2011  Procedure: ANTERIOR CERVICAL DECOMPRESSION/DISCECTOMY FUSION 3 LEVELS;  Surgeon: Winfield Cunas, MD;  Location: [facility];  Service: Neurosurgery;  Laterality: N/A;  Anterior Cervical Four-Five/Five-Six/Six-Seven Decompression with Fusion, Plating, and  Bonegraft . CARPAL TUNNEL RELEASE  2013  bilateral, per Dr. Christella Noa  . COLONOSCOPY  10-30-14  per Dr. Olevia Perches, clear, repeat in 10 yrs  . egd with esophageal dilation  9-08  per Dr. Olevia Perches . ERCP N/A 03/01/2016  Procedure: ENDOSCOPIC RETROGRADE CHOLANGIOPANCREATOGRAPHY (ERCP) with brushings and stent;  Surgeon: Doran Stabler, MD;  Location: [facility];  Service: Endoscopy;  Laterality: N/A; . ESOPHAGOGASTRODUODENOSCOPY (EGD) WITH PROPOFOL N/A 05/27/2020  Procedure: ESOPHAGOGASTRODUODENOSCOPY (EGD) WITH PROPOFOL;  Surgeon: Milus Banister, MD;  Location: [facility];  Service: Endoscopy;  Laterality: N/A; . EUS N/A 03/09/2016  Procedure: ESOPHAGEAL ENDOSCOPIC ULTRASOUND (EUS) RADIAL;  Surgeon: Milus Banister, MD;  Location: [facility];  Service: Endoscopy;  Laterality: N/A; . EUS N/A 05/27/2020  Procedure: UPPER ENDOSCOPIC ULTRASOUND (EUS) RADIAL;  Surgeon: Milus Banister, MD;  Location: [facility];  Service: Endoscopy;  Laterality: N/A; . FEMUR IM NAIL Right 10/25/2020  Procedure: INTRAMEDULLARY (IM) NAIL FEMORAL;  Surgeon: Rod Can, MD;  Location: [facility];  Service: Orthopedics;  Laterality: Right; . lymph nodes biopsy   . melanoma rt calf  1999 . PORT-A-CATH REMOVAL N/A 04/03/2019  Procedure: PORT REMOVAL;  Surgeon: Stark Klein, MD;  Location: [place];  Service: General;  Laterality: N/A; . PORTACATH PLACEMENT Left 03/22/2016  Procedure: INSERTION PORT-A-CATH;  Surgeon: Stark Klein, MD;  Location: [facility];  Service: General;  Laterality: Left; . SPINE SURGERY   . TONSILLECTOMY    as a child . ULNAR TUNNEL RELEASE  2013  right arm, per Dr. Christella Noa  . UPPER GASTROINTESTINAL ENDOSCOPY   . WHIPPLE PROCEDURE N/A 09/19/2016  Procedure: DIAGNOSTIC LAPAROSCOPY, LAPAROSCOPIC LIVER BIOPSY, RETROPERITONEAL EXPLORATION, INTRAOPERATIVE ULTRASOUND;  Surgeon: Stark Klein, MD;  Location: [place];  Service: General;  Laterality: N/A; HPI: Patient is a 65 year old Caucasian male with a past medical history significant for  but not limited to pancreatic cancer diagnosed in 2017 with recent recurrence, diabetes mellitus type 2, hypertension, hyperlipidemia, chronic diastolic CHF with an EF of 55 to 60% and grade 1 diastolic dysfunction back in March 2022, recent right hip fracture status post repair and intramedullary nail as well as other comorbidities who presented to [facility] long ED with a chief complaint of a sudden change in mental status. In the ED is found to be septic and initial source was thought to be another UTI but then further work-up revealed that he had a liver abscess.  2 drains placed in his liver abscess  Subjective: alert but drowsy Assessment / Plan / Recommendation CHL IP CLINICAL IMPRESSIONS 11/08/2020 Clinical Impression Patient presents with a mild-mod oropharyngeal dysphagia with likely impact from current level of cognitive delays and decreased overall alertness. During oral phase, patient exhibited delays in anterior to posterior transit of thin liquids and puree solids boluses and with barium tablet in puree, exhibited mild premature spillage of puree into vallecular sinus likely due to decreased bolus cohesion. trace to mild oral residuals post initial swallow which eventually cleared with uncued subsequent swallows. During pharyngeal phase of swallow, patient exhibited min vallecular residuals with puree solids, trace to min vallecular, pyriform and posterior pharynx residuals with thin liquids. Residuals were observed to fully clear pharynx during course of the study. Flash penetration was observed with thin liquids via straw sips and cup sips, however penetrate remained above vocal cords and fully cleared laryngeal vestibule. Barium tablet transited throught pharynx without difficulty. Slight narrowing of esophagus observed at level of approximately C4-6 where he has cervical hardware from h/o surgery. This narrowing did not result in significant stasis of  boluses or significantly impact pharyngeal and  upper esophageal transit. SLP recommending to initiate full liquids diet and with plan to work on upgraded solids trials at bedside. SLP Visit Diagnosis -- Attention and concentration deficit following -- Frontal lobe and executive function deficit following -- Impact on safety and function --   CHL IP TREATMENT RECOMMENDATION 11/08/2020 Treatment Recommendations Therapy as outlined in treatment plan below   Prognosis 11/08/2020 Prognosis for Safe Diet Advancement Good Barriers to Reach Goals -- Barriers/Prognosis Comment -- [facility]L IP DIET RECOMMENDATION 11/08/2020 SLP Diet Recommendations Other (Comment);Thin liquid Liquid Administration via Cup;Straw Medication Administration Whole meds with puree Compensations Minimize environmental distractions;Slow rate;Small sips/bites Postural Changes Seated upright at 90 degrees   CHL IP OTHER RECOMMENDATIONS 11/08/2020 Recommended Consults -- Oral Care Recommendations Oral care BID;Staff/trained caregiver to provide oral care Other Recommendations --   CHL IP FOLLOW UP RECOMMENDATIONS 11/08/2020 Follow up Recommendations 24 hour supervision/assistance;Skilled Nursing facility   [facility] IP FREQUENCY AND DURATION 11/08/2020 Speech Therapy Frequency (ACUTE ONLY) min 2x/week Treatment Duration 1 week      CHL IP ORAL PHASE 11/08/2020 Oral Phase -- Oral - Pudding Teaspoon -- Oral - Pudding Cup -- Oral - Honey Teaspoon -- Oral - Honey Cup -- Oral - Nectar Teaspoon -- Oral - Nectar Cup -- Oral - Nectar Straw -- Oral - Thin Teaspoon -- Oral - Thin Cup Delayed oral transit;Weak lingual manipulation;Holding of bolus;Reduced posterior propulsion;Lingual/palatal residue Oral - Thin Straw Lingual/palatal residue;Delayed oral transit;Weak lingual manipulation;Reduced posterior propulsion Oral - Puree Delayed oral transit;Weak lingual manipulation;Lingual/palatal residue;Reduced posterior propulsion Oral - Mech Soft -- Oral - Regular -- Oral - Multi-Consistency -- Oral - Pill Decreased bolus  cohesion;Delayed oral transit;Reduced posterior propulsion;Weak lingual manipulation;Premature spillage Oral Phase - Comment --  CHL IP PHARYNGEAL PHASE 11/08/2020 Pharyngeal Phase -- Pharyngeal- Pudding Teaspoon -- Pharyngeal -- Pharyngeal- Pudding Cup -- Pharyngeal -- Pharyngeal- Honey Teaspoon -- Pharyngeal -- Pharyngeal- Honey Cup -- Pharyngeal -- Pharyngeal- Nectar Teaspoon -- Pharyngeal -- Pharyngeal- Nectar Cup -- Pharyngeal -- Pharyngeal- Nectar Straw -- Pharyngeal -- Pharyngeal- Thin Teaspoon -- Pharyngeal -- Pharyngeal- Thin Cup Pharyngeal residue - valleculae;Pharyngeal residue - pyriform;Pharyngeal residue - posterior pharnyx Pharyngeal Material enters airway, remains ABOVE vocal cords then ejected out Pharyngeal- Thin Straw Penetration/Aspiration during swallow;Pharyngeal residue - pyriform;Pharyngeal residue - posterior pharnyx;Pharyngeal residue - valleculae Pharyngeal Material enters airway, remains ABOVE vocal cords then ejected out Pharyngeal- Puree Pharyngeal residue - valleculae Pharyngeal -- Pharyngeal- Mechanical Soft -- Pharyngeal -- Pharyngeal- Regular -- Pharyngeal -- Pharyngeal- Multi-consistency -- Pharyngeal -- Pharyngeal- Pill WFL Pharyngeal -- Pharyngeal Comment --  CHL IP CERVICAL ESOPHAGEAL PHASE 11/08/2020 Cervical Esophageal Phase Impaired Pudding Teaspoon -- Pudding Cup -- Honey Teaspoon -- Honey Cup -- Nectar Teaspoon -- Nectar Cup -- Nectar Straw -- Thin Teaspoon -- Thin Cup Other (Comment) Thin Straw -- Puree -- Mechanical Soft -- Regular -- Multi-consistency -- Pill -- Cervical Esophageal Comment -- Sonia Baller, MA, CCC-SLP Speech Therapy             EEG adult  Result Date: 11/08/2020 Plancher, Baron Sane, MD     11/08/2020  4:46 PM PROCEDURE: EEG video routine 23 minutesstudy DATES OF TEST: 11/08/20 REASON FOR TEST: Seizure AED/Sedative MEDICATIONS: Keppra TECHNIQUE: This is an 18 channel digital EEG recording using the standard international 10/20 system of electrode  placement with one channel EKG recording. Pt was awake and drowsy during this recording. FINDINGS: Background rhythm:symmetric 8.5-12 Hz  With intermittent asymmetric left 3-5 Hz Amplitude : normal  Breach effect:  NO Variability: NO Reactivity: NO Rhythmic delta activity:NO Periodic discharges: NO Sporadic epileptiform discharges:NO Electrographic/electroclinical seizures:NO Limited EKG reveals no abnormalities. IMPRESSION: This is an abnormal study due to - 1. L Focal slowing is consistent with a focal disturbance of cerebral function and an underlying structural lesion should be considered. 2. No definite epileptiform discharges or electrographic seizures noted.   Overnight EEG with video  Result Date: 11/09/2020 Lora Havens, MD     11/10/2020  4:09 AM Patient Name: Colton Bush MRN: [id number] Epilepsy Attending: Lora Havens Referring Physician/Provider: Dr Roland Rack Duration: 11/08/2020 2157 to 11/09/2020 1008 Patient history: 65 year old male with a history of pancreatic adenocarcinoma with recurrence, recent fall and admission for sepsis, now with seizures. EEG to evaluate for seizure Level of alertness: Awake, asleep AEDs during EEG study: LEV Technical aspects: This EEG study was done with scalp electrodes positioned according to the 10-20 International system of electrode placement. Electrical activity was acquired at a sampling rate of 500Hz  and reviewed with a high frequency filter of 70Hz  and a low frequency filter of 1Hz . EEG data were recorded continuously and digitally stored. Description: The posterior dominant rhythm consists of 9 Hz activity of moderate voltage (25-35 uV) seen predominantly in posterior head regions, symmetric and reactive to eye opening and eye closing.  Sleep was characterized by vertex waves, sleep spindles (12 to 14 Hz), maximal frontocentral region.  EEG showed intermittent 3 to 6 Hz theta-delta slowing in left temporal region. Hyperventilation and  photic stimulation were not performed.   ABNORMALITY - Intermittent slow, left temporal region IMPRESSION: This study is suggestive of cortical dysfunction arising from left temporal region, nonspecific etiology but likely secondary to underlying subdural Hematoma. No seizures or definite epileptiform discharges were seen throughout the recording. Lora Havens   DG C-Arm 1-60 Min-No Report  Result Date: 10/25/2020 Fluoroscopy was utilized by the requesting physician.  No radiographic interpretation.   ECHOCARDIOGRAM COMPLETE  Result Date: 10/22/2020    ECHOCARDIOGRAM REPORT   Patient Name:   Colton Bush Date of Exam: 10/22/2020 Medical Rec #:  [id number]      Height:       70.0 in Accession #:    [id number]     Weight:       196.0 lb Date of Birth:  1955-11-29      BSA:          2.069 m Patient Age:    65 years       BP:           136/69 mmHg Patient Gender: M              HR:           79 bpm. Exam Location:  Inpatient Procedure: 2D Echo, Cardiac Doppler and Color Doppler Indications:    Bacteremia  History:        Patient has no prior history of Echocardiogram examinations.                 Signs/Symptoms:Bacteremia and UTI; Risk Factors:Hypertension,                 Diabetes and Dyslipidemia. Pancreatic cancer.  Sonographer:    Dustin Flock Referring Phys: [phone] A CALDWELL POWELL JR  Sonographer Comments: Image acquisition challenging due to respiratory motion. IMPRESSIONS  1. Left ventricular ejection fraction, by estimation, is 55 to 60%. The left ventricle has normal function. The left ventricle has no regional wall  motion abnormalities. There is mild concentric left ventricular hypertrophy. Left ventricular diastolic parameters are consistent with Grade I diastolic dysfunction (impaired relaxation).  2. Right ventricular systolic function is normal. The right ventricular size is normal.  3. The mitral valve is normal in structure. No evidence of mitral valve regurgitation. No evidence of  mitral stenosis.  4. The aortic valve is normal in structure. Aortic valve regurgitation is not visualized. No aortic stenosis is present.  5. The inferior vena cava is normal in size with greater than 50% respiratory variability, suggesting right atrial pressure of 3 mmHg. Conclusion(s)/Recommendation(s): No evidence of valvular vegetations on this transthoracic echocardiogram. Would recommend a transesophageal echocardiogram to exclude infective endocarditis if clinically indicated. FINDINGS  Left Ventricle: Left ventricular ejection fraction, by estimation, is 55 to 60%. The left ventricle has normal function. The left ventricle has no regional wall motion abnormalities. The left ventricular internal cavity size was normal in size. There is  mild concentric left ventricular hypertrophy. Left ventricular diastolic parameters are consistent with Grade I diastolic dysfunction (impaired relaxation). Indeterminate filling pressures. Right Ventricle: The right ventricular size is normal. No increase in right ventricular wall thickness. Right ventricular systolic function is normal. Left Atrium: Left atrial size was normal in size. Right Atrium: Right atrial size was normal in size. Pericardium: There is no evidence of pericardial effusion. Mitral Valve: The mitral valve is normal in structure. Mild mitral annular calcification. No evidence of mitral valve regurgitation. No evidence of mitral valve stenosis. Tricuspid Valve: The tricuspid valve is normal in structure. Tricuspid valve regurgitation is not demonstrated. No evidence of tricuspid stenosis. Aortic Valve: The aortic valve is normal in structure. Aortic valve regurgitation is not visualized. No aortic stenosis is present. Pulmonic Valve: The pulmonic valve was normal in structure. Pulmonic valve regurgitation is not visualized. No evidence of pulmonic stenosis. Aorta: The aortic root is normal in size and structure. Venous: The inferior vena cava is normal in  size with greater than 50% respiratory variability, suggesting right atrial pressure of 3 mmHg. IAS/Shunts: No atrial level shunt detected by color flow Doppler.  LEFT VENTRICLE PLAX 2D LVIDd:         3.90 cm  Diastology LVIDs:         2.70 cm  LV e' medial:    7.72 cm/s LV PW:         1.30 cm  LV E/e' medial:  9.3 LV IVS:        1.30 cm  LV e' lateral:   6.64 cm/s LVOT diam:     2.50 cm  LV E/e' lateral: 10.8 LV SV:         102 LV SV Index:   49 LVOT Area:     4.91 cm  RIGHT VENTRICLE RV Basal diam:  2.90 cm RV S prime:     10.80 cm/s TAPSE (M-mode): 2.9 cm LEFT ATRIUM             Index       RIGHT ATRIUM           Index LA diam:        3.30 cm 1.59 cm/m  RA Area:     12.20 cm LA Vol (A2C):   54.9 ml 26.53 ml/m RA Volume:   29.40 ml  14.21 ml/m LA Vol (A4C):   29.8 ml 14.40 ml/m LA Biplane Vol: 41.4 ml 20.01 ml/m  AORTIC VALVE LVOT Vmax:   92.40 cm/s LVOT Vmean:  63.500 cm/s LVOT  VTI:    0.207 m  AORTA Ao Root diam: 3.20 cm MITRAL VALVE MV Area (PHT): 7.16 cm     SHUNTS MV Decel Time: 106 msec     Systemic VTI:  0.21 m MV E velocity: 71.80 cm/s   Systemic Diam: 2.50 cm MV A velocity: 111.00 cm/s MV E/A ratio:  0.65 Mihai Croitoru MD Electronically signed by Sanda Klein MD Signature Date/Time: 10/22/2020/4:07:33 PM    Final    DG HIP OPERATIVE UNILAT W OR W/O PELVIS RIGHT  Result Date: 10/25/2020 CLINICAL DATA:  IM nail right femur EXAM: OPERATIVE right HIP (WITH PELVIS IF PERFORMED) 4 VIEWS TECHNIQUE: Fluoroscopic spot image(s) were submitted for interpretation post-operatively. COMPARISON:  10/18/2020 FINDINGS: Four low resolution intraoperative spot views of the right hip. Total fluoroscopy time was 48 seconds. The images demonstrate intramedullary rod and distal screw fixation of right femur for comminuted intertrochanteric fracture. IMPRESSION: Intraoperative fluoroscopic assistance provided during surgical fixation of right proximal femoral fracture Electronically Signed   By: Donavan Foil M.D.    On: 10/25/2020 19:10   DG Hip Unilat With Pelvis 2-3 Views Right  Result Date: 10/18/2020 CLINICAL DATA:  Fall, hip pain status post fall EXAM: DG HIP (WITH OR WITHOUT PELVIS) 2-3V RIGHT COMPARISON:  Prior CT imaging of the chest, abdomen and pelvis. FINDINGS: No fracture of the bony pelvis.  Spinal degenerative changes. Comminuted RIGHT proximal femoral fracture, intratrochanteric type with distraction of the lesser trochanter. Mild displacement of distal femur relative to proximal femur, slight anterior displacement 2-3 mm. Mild to moderate varus angulation at the fracture site. Sclerotic lesion in the LEFT iliac bone unchanged likely large bone island. IMPRESSION: Comminuted mildly angulated RIGHT intratrochanteric fracture. Electronically Signed   By: Zetta Bills M.D.   On: 10/18/2020 19:17   CT IMAGE GUIDED DRAINAGE BY PERCUTANEOUS CATHETER  Result Date: 11/08/2020 INDICATION: 65 year old male with a history hepatic abscess referred for drainage EXAM: CT GUIDED DRAINAGE OF HEPATIC ABSCESS MEDICATIONS: The patient is currently admitted to the hospital and receiving intravenous antibiotics. The antibiotics were administered within an appropriate time frame prior to the initiation of the procedure. ANESTHESIA/SEDATION: 0 mg IV Versed 25 mcg IV Fentanyl Moderate Sedation Time:  0 The patient was continuously monitored during the procedure by the interventional radiology nurse under my direct supervision. COMPLICATIONS: None TECHNIQUE: Informed written consent was obtained from the patient after a thorough discussion of the procedural risks, benefits and alternatives. All questions were addressed. Maximal Sterile Barrier Technique was utilized including caps, mask, sterile gowns, sterile gloves, sterile drape, hand hygiene and skin antiseptic. A timeout was performed prior to the initiation of the procedure. PROCEDURE: CT of the liver was performed with the patient in right anterior oblique position. The  right upper quadrant was prepped with chlorhexidine in a sterile fashion, and a sterile drape was applied covering the operative field. A sterile gown and sterile gloves were used for the procedure. Local anesthesia was provided with 1% Lidocaine. Once we determine appropriate angle of approach for drainage, 1% lidocaine was used for local anesthesia. Using CT guidance, 2 separate 18 gauge trocar needles were advanced intercostal location towards the posterior right hepatic abscess. Once we confirmed needle tip position, modified Seldinger technique was used to place 2 separate 10 French pigtail drainage catheters. Aspiration of purulence fluid at both location confirmed adequate positioning. Both drains were sutured in position and attached to bulb suction. Final CT was acquired. Patient tolerated procedure well and remained hemodynamically stable throughout. No  complications were encountered and no significant blood loss. FINDINGS: CT confirms 2 hypoechoic fluid collection in the posterior right liver. After CT-guided drainage, pigtail catheters remain adequately position with collapse of both cavities. Sample was sent from both sites. IMPRESSION: Status post CT-guided drainage of 2 separate right-sided hepatic abscess. Signed, Dulcy Fanny. Dellia Nims, RPVI [facility] Electronically Signed   By: Corrie Mckusick D.O.   On: 11/08/2020 08:21   ECHOCARDIOGRAM LIMITED  Result Date: 11/08/2020    ECHOCARDIOGRAM REPORT   Patient Name:   Colton Bush Date of Exam: 11/06/2020 Medical Rec #:  [id number]      Height:       70.0 in Accession #:    [id number]     Weight:       174.2 lb Date of Birth:  1956-02-26      BSA:          1.968 m Patient Age:    16 years       BP:           118/71 mmHg Patient Gender: M              HR:           108 bpm. Exam Location:  Inpatient Procedure: Limited Echo, Cardiac Doppler and Limited Color Doppler Indications:    Bacteremia   History:        Patient has prior history of Echocardiogram examinations, most                 recent 10/22/2020. Sepsis; Risk Factors:Hypertension, Diabetes                 and Dyslipidemia.  Sonographer:    Johny Chess Referring Phys: [place]  1. Limited echo, poor endocardial definition. Defininty contrast not used.  2. Left ventricular ejection fraction, by estimation, is 60 to 65%. The left ventricle has normal function. Left ventricular endocardial border not optimally defined to evaluate regional wall motion. There is mild left ventricular hypertrophy. Indeterminate diastolic filling due to E-A fusion.  3. Right ventricular systolic function is hyperdynamic. The right ventricular size is normal.  4. The mitral valve is abnormal. No evidence of mitral valve regurgitation.  5. The aortic valve is tricuspid. Aortic valve regurgitation is not visualized. Comparison(s): Changes from prior study are noted. 10/02/2020: LVEF 55-60%. Conclusion(s)/Recommendation(s): No evidence of valvular vegetations on this transthoracic echocardiogram, however, visualization of the valves was quite limited. Would recommend a transesophageal echocardiogram to exclude infective endocarditis if clinically indicated. FINDINGS  Left Ventricle: Left ventricular ejection fraction, by estimation, is 60 to 65%. The left ventricle has normal function. Left ventricular endocardial border not optimally defined to evaluate regional wall motion. The left ventricular internal cavity size was normal in size. There is mild left ventricular hypertrophy. Indeterminate diastolic filling due to E-A fusion. Right Ventricle: The right ventricular size is normal. No increase in right ventricular wall thickness. Right ventricular systolic function is hyperdynamic. Left Atrium: Left atrial size was not well visualized. Right Atrium: Right atrial size was not well visualized. Pericardium: There is no evidence of pericardial  effusion. Mitral Valve: The mitral valve is abnormal. There is mild thickening of the mitral valve leaflet(s). No evidence of mitral valve regurgitation. Tricuspid Valve: The tricuspid valve is not well visualized. Tricuspid valve regurgitation is not demonstrated. Aortic Valve: The aortic valve is tricuspid. Aortic valve regurgitation is not visualized. Pulmonic Valve: The pulmonic  valve was not well visualized. Pulmonic valve regurgitation is not visualized. Aorta: The aortic arch was not well visualized and the ascending aorta was not well visualized. Venous: The inferior vena cava was not well visualized. IAS/Shunts: No atrial level shunt detected by color flow Doppler. Lyman Bishop MD Electronically signed by Lyman Bishop MD Signature Date/Time: 11/08/2020/10:13:44 AM    Final      The results of significant diagnostics from this hospitalization (including imaging, microbiology, ancillary and laboratory) are listed below for reference.     Microbiology: Recent Results (from the past 240 hour(s))  Resp Panel by RT-PCR (Flu A&B, Covid) Nasopharyngeal Swab     Status: None   Collection Time: 11/05/20  7:14 PM   Specimen: Nasopharyngeal Swab; Nasopharyngeal(NP) swabs in vial transport medium  Result Value Ref Range Status   SARS Coronavirus 2 by RT PCR NEGATIVE NEGATIVE Final    Comment: (NOTE) SARS-CoV-2 target nucleic acids are NOT DETECTED.  The SARS-CoV-2 RNA is generally detectable in upper respiratory specimens during the acute phase of infection. The lowest concentration of SARS-CoV-2 viral copies this assay can detect is 138 copies/mL. A negative result does not preclude SARS-Cov-2 infection and should not be used as the sole basis for treatment or other patient management decisions. A negative result may occur with  improper specimen collection/handling, submission of specimen other than nasopharyngeal swab, presence of viral mutation(s) within the areas targeted by this assay, and  inadequate number of viral copies(<138 copies/mL). A negative result must be combined with clinical observations, patient history, and epidemiological information. The expected result is Negative.  Fact Sheet for Patients:  EntrepreneurPulse.com.au  Fact Sheet for Healthcare Providers:  IncredibleEmployment.be  This test is no t yet approved or cleared by the [place] FDA and  has been authorized for detection and/or diagnosis of SARS-CoV-2 by FDA under an Emergency Use Authorization (EUA). This EUA will remain  in effect (meaning this test can be used) for the duration of the COVID-19 declaration under Section 564(b)(1) of the Act, 21 U.S.C.section 360bbb-3(b)(1), unless the authorization is terminated  or revoked sooner.       Influenza A by PCR NEGATIVE NEGATIVE Final   Influenza B by PCR NEGATIVE NEGATIVE Final    Comment: (NOTE) The Xpert Xpress SARS-CoV-2/FLU/RSV plus assay is intended as an aid in the diagnosis of influenza from Nasopharyngeal swab specimens and should not be used as a sole basis for treatment. Nasal washings and aspirates are unacceptable for Xpert Xpress SARS-CoV-2/FLU/RSV testing.  Fact Sheet for Patients: EntrepreneurPulse.com.au  Fact Sheet for Healthcare Providers: IncredibleEmployment.be  This test is not yet approved or cleared by the [place] FDA and has been authorized for detection and/or diagnosis of SARS-CoV-2 by FDA under an Emergency Use Authorization (EUA). This EUA will remain in effect (meaning this test can be used) for the duration of the COVID-19 declaration under Section 564(b)(1) of the Act, 21 U.S.C. section 360bbb-3(b)(1), unless the authorization is terminated or revoked.  Performed at [facility], [place] [address], [place], [place] [address]   Blood Culture (routine x 2)     Status: None   Collection Time: 11/05/20  8:36  PM   Specimen: BLOOD  Result Value Ref Range Status   Specimen Description   Final    BLOOD BLOOD RIGHT FOREARM Performed at [place] [address] [place], [place] [address]    Special Requests   Final    BOTTLES DRAWN AEROBIC AND ANAEROBIC Blood  Culture results may not be optimal due to an inadequate volume of blood received in culture bottles Performed at [facility] [address] [place], [place] [address]    Culture   Final    NO GROWTH 5 DAYS Performed at [facility], [place] [address] [place], [place] [address]    Report Status 11/11/2020 FINAL  Final  Blood Culture (routine x 2)     Status: None   Collection Time: 11/05/20  8:38 PM   Specimen: BLOOD  Result Value Ref Range Status   Specimen Description   Final    BLOOD Performed at [place] [address] [place], [place] [address]    Special Requests   Final    BOTTLES DRAWN AEROBIC AND ANAEROBIC Blood Culture results may not be optimal due to an inadequate volume of blood received in culture bottles Performed at [place] [address], [place], [place] [address]    Culture   Final    NO GROWTH 5 DAYS Performed at [facility], [place] [address] [place], [place] [address]    Report Status 11/11/2020 FINAL  Final  Urine culture     Status: Abnormal   Collection Time: 11/05/20  8:38 PM   Specimen: In/Out Cath Urine  Result Value Ref Range Status   Specimen Description   Final    IN/OUT CATH URINE Performed at [place] [address] [place], [place] [address]    Special Requests   Final    NONE Performed at [facility], [place] [address], [place], [place] [address]    Culture 60,000 COLONIES/mL ESCHERICHIA COLI (A)  Final   Report Status 11/08/2020 FINAL  Final   Organism ID, Bacteria ESCHERICHIA COLI (A)  Final      Susceptibility   Escherichia coli - MIC*     AMPICILLIN 4 SENSITIVE Sensitive     CEFAZOLIN <=4 SENSITIVE Sensitive     CEFEPIME <=0.12 SENSITIVE Sensitive     CEFTRIAXONE <=0.25 SENSITIVE Sensitive     CIPROFLOXACIN <=0.25 SENSITIVE Sensitive     GENTAMICIN <=1 SENSITIVE Sensitive     IMIPENEM <=0.25 SENSITIVE Sensitive     NITROFURANTOIN <=16 SENSITIVE Sensitive     TRIMETH/SULFA <=20 SENSITIVE Sensitive     AMPICILLIN/SULBACTAM <=2 SENSITIVE Sensitive     PIP/TAZO <=4 SENSITIVE Sensitive     * 60,000 COLONIES/mL ESCHERICHIA COLI  MRSA PCR Screening     Status: None   Collection Time: 11/06/20  8:42 AM   Specimen: Nasopharyngeal  Result Value Ref Range Status   MRSA by PCR NEGATIVE NEGATIVE Final    Comment:        The GeneXpert MRSA Assay (FDA approved for NASAL specimens only), is one component of a comprehensive MRSA colonization surveillance program. It is not intended to diagnose MRSA infection nor to guide or monitor treatment for MRSA infections. Performed at [facility], [place] [address] [place], [place] [address]   Aerobic/Anaerobic Culture (surgical/deep wound)     Status: None   Collection Time: 11/06/20  3:45 PM   Specimen: Liver; Abscess  Result Value Ref Range Status   Specimen Description   Final    LIVER Performed at [place] [address] [place], [place] [address]    Special Requests   Final    NONE Performed at [facility], [place] [address], [place], [place] [address]  Gram Stain FEW GRAM VARIABLE ROD  Final   Culture   Final    ABUNDANT KLEBSIELLA PNEUMONIAE ABUNDANT ESCHERICHIA COLI NO ANAEROBES ISOLATED Performed at [facility], [place] [address] [place], [place] [address]    Report Status 11/11/2020 FINAL  Final   Organism ID, Bacteria KLEBSIELLA PNEUMONIAE  Final   Organism ID, Bacteria ESCHERICHIA COLI  Final      Susceptibility   Escherichia coli - MIC*    AMPICILLIN <=2 SENSITIVE Sensitive     CEFAZOLIN <=4  SENSITIVE Sensitive     CEFEPIME <=0.12 SENSITIVE Sensitive     CEFTAZIDIME <=1 SENSITIVE Sensitive     CEFTRIAXONE <=0.25 SENSITIVE Sensitive     CIPROFLOXACIN <=0.25 SENSITIVE Sensitive     GENTAMICIN <=1 SENSITIVE Sensitive     IMIPENEM <=0.25 SENSITIVE Sensitive     TRIMETH/SULFA <=20 SENSITIVE Sensitive     AMPICILLIN/SULBACTAM <=2 SENSITIVE Sensitive     PIP/TAZO <=4 SENSITIVE Sensitive     * ABUNDANT ESCHERICHIA COLI   Klebsiella pneumoniae - MIC*    AMPICILLIN >=32 RESISTANT Resistant     CEFAZOLIN <=4 SENSITIVE Sensitive     CEFEPIME <=0.12 SENSITIVE Sensitive     CEFTAZIDIME <=1 SENSITIVE Sensitive     CEFTRIAXONE <=0.25 SENSITIVE Sensitive     CIPROFLOXACIN <=0.25 SENSITIVE Sensitive     GENTAMICIN <=1 SENSITIVE Sensitive     IMIPENEM <=0.25 SENSITIVE Sensitive     TRIMETH/SULFA <=20 SENSITIVE Sensitive     AMPICILLIN/SULBACTAM 16 INTERMEDIATE Intermediate     PIP/TAZO 16 SENSITIVE Sensitive     * ABUNDANT KLEBSIELLA PNEUMONIAE  Aerobic/Anaerobic Culture w Gram Stain (surgical/deep wound)     Status: None   Collection Time: 11/06/20  3:46 PM   Specimen: Liver; Abscess  Result Value Ref Range Status   Specimen Description LIVER ABSCESS  Final   Special Requests MORE INFERIOR RIGHT LIVER  Final   Gram Stain   Final    FEW WBC PRESENT, PREDOMINANTLY PMN FEW GRAM NEGATIVE RODS RARE GRAM POSITIVE COCCI IN PAIRS    Culture   Final    ABUNDANT ESCHERICHIA COLI ABUNDANT STREPTOCOCCUS ANGINOSIS NO ANAEROBES ISOLATED Performed at [facility], [place] [address], [place], [place] [address]    Report Status 11/11/2020 FINAL  Final   Organism ID, Bacteria ESCHERICHIA COLI  Final   Organism ID, Bacteria STREPTOCOCCUS ANGINOSIS  Final      Susceptibility   Escherichia coli - MIC*    AMPICILLIN 4 SENSITIVE Sensitive     CEFAZOLIN <=4 SENSITIVE Sensitive     CEFEPIME <=0.12 SENSITIVE Sensitive     CEFTAZIDIME <=1 SENSITIVE Sensitive     CEFTRIAXONE <=0.25 SENSITIVE  Sensitive     CIPROFLOXACIN <=0.25 SENSITIVE Sensitive     GENTAMICIN <=1 SENSITIVE Sensitive     IMIPENEM <=0.25 SENSITIVE Sensitive     TRIMETH/SULFA <=20 SENSITIVE Sensitive     AMPICILLIN/SULBACTAM <=2 SENSITIVE Sensitive     PIP/TAZO <=4 SENSITIVE Sensitive     * ABUNDANT ESCHERICHIA COLI   Streptococcus anginosis - MIC*    PENICILLIN <=0.06 SENSITIVE Sensitive     CEFTRIAXONE 0.5 SENSITIVE Sensitive     ERYTHROMYCIN <=0.12 SENSITIVE Sensitive     LEVOFLOXACIN 0.5 SENSITIVE Sensitive     VANCOMYCIN 1 SENSITIVE Sensitive     * ABUNDANT STREPTOCOCCUS ANGINOSIS     Labs: BNP (last 3 results) No results for input(s): BNP in the last 8760 hours. Basic Metabolic Panel: Recent Labs  Lab 11/06/20 1424 11/06/20  1820 11/07/20 0124 11/08/20 0237 11/09/20 0132 11/09/20 0801 11/09/20 1251 11/10/20 0705 11/10/20 0829  NA 139   < > 137 136 136 137 140 136 135  K 3.0*   < > 3.2* 2.7* 3.2* 3.0* 3.0* 3.3* 3.6  CL 110   < > 107 104 103 101  --  102 101  CO2 21*   < > 21* 24 27 28   --  30 26  GLUCOSE 139*   < > 169* 79 140* 179*  --  209* 210*  BUN 15   < > 14 10 5* <5*  --  6* 7*  CREATININE 0.57*   < > 0.43* 0.45* 0.44* 0.42*  --  0.42* 0.44*  CALCIUM 8.3*   < > 8.2* 7.8* 7.5* 7.7*  --  8.0* 8.0*  MG 1.7  --  1.9 1.7 1.8 1.9  --   --   --   PHOS 2.4*  --  2.2* 3.0 2.4*  --   --   --   --    < > = values in this interval not displayed.   Liver Function Tests: Recent Labs  Lab 11/06/20 0337 11/06/20 1424 11/07/20 0124 11/08/20 0237 11/09/20 0132  AST 18 29 45* 50* 27  ALT 22 22 28  40 31  ALKPHOS 303* 252* 230* 258* 234*  BILITOT 1.9* 0.1* 0.8 0.7 0.6  PROT 7.8 6.7 6.2* 6.2* 5.6*  ALBUMIN 2.6* 2.2* 2.1* 2.1* 1.7*   No results for input(s): LIPASE, AMYLASE in the last 168 hours. Recent Labs  Lab 11/07/20 0851  AMMONIA 19   CBC: Recent Labs  Lab 11/05/20 1930 11/05/20 2007 11/06/20 0337 11/07/20 0124 11/08/20 0237 11/09/20 0132 11/09/20 1251 11/10/20 0705  11/10/20 0829  WBC 21.3*  --  31.0* 22.6* 11.7* 10.3  --  9.6 11.0*  NEUTROABS 20.1*  --  29.4* 20.9* 9.9* 8.5*  --   --   --   HGB 12.5*   < > 11.0* 10.3* 9.9* 9.6* 10.2* 10.0* 10.9*  HCT 39.1   < > 35.6* 31.7* 31.1* 29.4* 30.0* 30.6* 33.5*  MCV 94.7  --  95.4 93.0 93.4 91.6  --  91.9 90.8  PLT 529*  --  635* 470* 410* 386  --  415* 469*   < > = values in this interval not displayed.   Cardiac Enzymes: No results for input(s): CKTOTAL, CKMB, CKMBINDEX, TROPONINI in the last 168 hours. BNP: Invalid input(s): POCBNP CBG: Recent Labs  Lab 11/11/20 0722 11/11/20 1114 11/11/20 1800 11/11/20 2300 11/12/20 0718  GLUCAP 113* 228* 214* 195* 144*   D-Dimer No results for input(s): DDIMER in the last 72 hours. Hgb A1c No results for input(s): HGBA1C in the last 72 hours. Lipid Profile No results for input(s): CHOL, HDL, LDLCALC, TRIG, CHOLHDL, LDLDIRECT in the last 72 hours. Thyroid function studies No results for input(s): TSH, T4TOTAL, T3FREE, THYROIDAB in the last 72 hours.  Invalid input(s): FREET3 Anemia work up No results for input(s): VITAMINB12, FOLATE, FERRITIN, TIBC, IRON, RETICCTPCT in the last 72 hours. Urinalysis    Component Value Date/Time   COLORURINE YELLOW 11/05/2020 2038   APPEARANCEUR HAZY (A) 11/05/2020 2038   LABSPEC 1.027 11/05/2020 2038   PHURINE 5.0 11/05/2020 2038   GLUCOSEU >=500 (A) 11/05/2020 2038   HGBUR MODERATE (A) 11/05/2020 2038   BILIRUBINUR NEGATIVE 11/05/2020 2038   BILIRUBINUR 1+ 02/23/2016 1256   KETONESUR 80 (A) 11/05/2020 2038   PROTEINUR 30 (A) 11/05/2020 2038   UROBILINOGEN  1.0 02/23/2016 1256   NITRITE NEGATIVE 11/05/2020 2038   LEUKOCYTESUR TRACE (A) 11/05/2020 2038   Sepsis Labs Invalid input(s): PROCALCITONIN,  WBC,  LACTICIDVEN Microbiology Recent Results (from the past 240 hour(s))  Resp Panel by RT-PCR (Flu A&B, Covid) Nasopharyngeal Swab     Status: None   Collection Time: 11/05/20  7:14 PM   Specimen: Nasopharyngeal  Swab; Nasopharyngeal(NP) swabs in vial transport medium  Result Value Ref Range Status   SARS Coronavirus 2 by RT PCR NEGATIVE NEGATIVE Final    Comment: (NOTE) SARS-CoV-2 target nucleic acids are NOT DETECTED.  The SARS-CoV-2 RNA is generally detectable in upper respiratory specimens during the acute phase of infection. The lowest concentration of SARS-CoV-2 viral copies this assay can detect is 138 copies/mL. A negative result does not preclude SARS-Cov-2 infection and should not be used as the sole basis for treatment or other patient management decisions. A negative result may occur with  improper specimen collection/handling, submission of specimen other than nasopharyngeal swab, presence of viral mutation(s) within the areas targeted by this assay, and inadequate number of viral copies(<138 copies/mL). A negative result must be combined with clinical observations, patient history, and epidemiological information. The expected result is Negative.  Fact Sheet for Patients:  EntrepreneurPulse.com.au  Fact Sheet for Healthcare Providers:  IncredibleEmployment.be  This test is no t yet approved or cleared by the [place] FDA and  has been authorized for detection and/or diagnosis of SARS-CoV-2 by FDA under an Emergency Use Authorization (EUA). This EUA will remain  in effect (meaning this test can be used) for the duration of the COVID-19 declaration under Section 564(b)(1) of the Act, 21 U.S.C.section 360bbb-3(b)(1), unless the authorization is terminated  or revoked sooner.       Influenza A by PCR NEGATIVE NEGATIVE Final   Influenza B by PCR NEGATIVE NEGATIVE Final    Comment: (NOTE) The Xpert Xpress SARS-CoV-2/FLU/RSV plus assay is intended as an aid in the diagnosis of influenza from Nasopharyngeal swab specimens and should not be used as a sole basis for treatment. Nasal washings and aspirates are unacceptable for Xpert Xpress  SARS-CoV-2/FLU/RSV testing.  Fact Sheet for Patients: EntrepreneurPulse.com.au  Fact Sheet for Healthcare Providers: IncredibleEmployment.be  This test is not yet approved or cleared by the [place] FDA and has been authorized for detection and/or diagnosis of SARS-CoV-2 by FDA under an Emergency Use Authorization (EUA). This EUA will remain in effect (meaning this test can be used) for the duration of the COVID-19 declaration under Section 564(b)(1) of the Act, 21 U.S.C. section 360bbb-3(b)(1), unless the authorization is terminated or revoked.  Performed at [facility], [place] [address] [place], [place] [address]   Blood Culture (routine x 2)     Status: None   Collection Time: 11/05/20  8:36 PM   Specimen: BLOOD  Result Value Ref Range Status   Specimen Description   Final    BLOOD BLOOD RIGHT FOREARM Performed at [place] [address], [place], [place] [address]    Special Requests   Final    BOTTLES DRAWN AEROBIC AND ANAEROBIC Blood Culture results may not be optimal due to an inadequate volume of blood received in culture bottles Performed at [place] [address], [place], [place] [address]    Culture   Final    NO GROWTH 5 DAYS Performed at [facility], [place] [address] [place], [place] [address]    Report Status 11/11/2020 FINAL  Final  Blood Culture (routine x 2)     Status: None   Collection Time: 11/05/20  8:38 PM   Specimen: BLOOD  Result Value Ref Range Status   Specimen Description   Final    BLOOD Performed at [place] [address], [place], [place] [address]    Special Requests   Final    BOTTLES DRAWN AEROBIC AND ANAEROBIC Blood Culture results may not be optimal due to an inadequate volume of blood received in culture bottles Performed at [facility] [address], [place], [place]  [address]    Culture   Final    NO GROWTH 5 DAYS Performed at [facility], [place] [address], [place], [place] [address]    Report Status 11/11/2020 FINAL  Final  Urine culture     Status: Abnormal   Collection Time: 11/05/20  8:38 PM   Specimen: In/Out Cath Urine  Result Value Ref Range Status   Specimen Description   Final    IN/OUT CATH URINE Performed at [place] [address], [place], [place] [address]    Special Requests   Final    NONE Performed at [facility], [place] [address] [place], [place] [address]    Culture 60,000 COLONIES/mL ESCHERICHIA COLI (A)  Final   Report Status 11/08/2020 FINAL  Final   Organism ID, Bacteria ESCHERICHIA COLI (A)  Final      Susceptibility   Escherichia coli - MIC*    AMPICILLIN 4 SENSITIVE Sensitive     CEFAZOLIN <=4 SENSITIVE Sensitive     CEFEPIME <=0.12 SENSITIVE Sensitive     CEFTRIAXONE <=0.25 SENSITIVE Sensitive     CIPROFLOXACIN <=0.25 SENSITIVE Sensitive     GENTAMICIN <=1 SENSITIVE Sensitive     IMIPENEM <=0.25 SENSITIVE Sensitive     NITROFURANTOIN <=16 SENSITIVE Sensitive     TRIMETH/SULFA <=20 SENSITIVE Sensitive     AMPICILLIN/SULBACTAM <=2 SENSITIVE Sensitive     PIP/TAZO <=4 SENSITIVE Sensitive     * 60,000 COLONIES/mL ESCHERICHIA COLI  MRSA PCR Screening     Status: None   Collection Time: 11/06/20  8:42 AM   Specimen: Nasopharyngeal  Result Value Ref Range Status   MRSA by PCR NEGATIVE NEGATIVE Final    Comment:        The GeneXpert MRSA Assay (FDA approved for NASAL specimens only), is one component of a comprehensive MRSA colonization surveillance program. It is not intended to diagnose MRSA infection nor to guide or monitor treatment for MRSA infections. Performed at [facility], [place] [address], [place], [place] [address]   Aerobic/Anaerobic Culture (surgical/deep wound)     Status: None   Collection Time: 11/06/20  3:45 PM   Specimen:  Liver; Abscess  Result Value Ref Range Status   Specimen Description   Final    LIVER Performed at [place] [address] [place], [place] [address]    Special Requests   Final    NONE Performed at [facility], [place] [address], [place], [place] [address]    Gram Stain FEW GRAM VARIABLE ROD  Final   Culture   Final    ABUNDANT KLEBSIELLA PNEUMONIAE ABUNDANT ESCHERICHIA COLI NO ANAEROBES ISOLATED Performed at [facility], [place] [address], [place], [place] [address]    Report Status 11/11/2020 FINAL  Final   Organism ID, Bacteria KLEBSIELLA PNEUMONIAE  Final   Organism ID, Bacteria ESCHERICHIA  COLI  Final      Susceptibility   Escherichia coli - MIC*    AMPICILLIN <=2 SENSITIVE Sensitive     CEFAZOLIN <=4 SENSITIVE Sensitive     CEFEPIME <=0.12 SENSITIVE Sensitive     CEFTAZIDIME <=1 SENSITIVE Sensitive     CEFTRIAXONE <=0.25 SENSITIVE Sensitive     CIPROFLOXACIN <=0.25 SENSITIVE Sensitive     GENTAMICIN <=1 SENSITIVE Sensitive     IMIPENEM <=0.25 SENSITIVE Sensitive     TRIMETH/SULFA <=20 SENSITIVE Sensitive     AMPICILLIN/SULBACTAM <=2 SENSITIVE Sensitive     PIP/TAZO <=4 SENSITIVE Sensitive     * ABUNDANT ESCHERICHIA COLI   Klebsiella pneumoniae - MIC*    AMPICILLIN >=32 RESISTANT Resistant     CEFAZOLIN <=4 SENSITIVE Sensitive     CEFEPIME <=0.12 SENSITIVE Sensitive     CEFTAZIDIME <=1 SENSITIVE Sensitive     CEFTRIAXONE <=0.25 SENSITIVE Sensitive     CIPROFLOXACIN <=0.25 SENSITIVE Sensitive     GENTAMICIN <=1 SENSITIVE Sensitive     IMIPENEM <=0.25 SENSITIVE Sensitive     TRIMETH/SULFA <=20 SENSITIVE Sensitive     AMPICILLIN/SULBACTAM 16 INTERMEDIATE Intermediate     PIP/TAZO 16 SENSITIVE Sensitive     * ABUNDANT KLEBSIELLA PNEUMONIAE  Aerobic/Anaerobic Culture w Gram Stain (surgical/deep wound)     Status: None   Collection Time: 11/06/20  3:46 PM   Specimen: Liver; Abscess  Result Value Ref Range Status    Specimen Description LIVER ABSCESS  Final   Special Requests MORE INFERIOR RIGHT LIVER  Final   Gram Stain   Final    FEW WBC PRESENT, PREDOMINANTLY PMN FEW GRAM NEGATIVE RODS RARE GRAM POSITIVE COCCI IN PAIRS    Culture   Final    ABUNDANT ESCHERICHIA COLI ABUNDANT STREPTOCOCCUS ANGINOSIS NO ANAEROBES ISOLATED Performed at [facility], [place] [address], [place], [place] [address]    Report Status 11/11/2020 FINAL  Final   Organism ID, Bacteria ESCHERICHIA COLI  Final   Organism ID, Bacteria STREPTOCOCCUS ANGINOSIS  Final      Susceptibility   Escherichia coli - MIC*    AMPICILLIN 4 SENSITIVE Sensitive     CEFAZOLIN <=4 SENSITIVE Sensitive     CEFEPIME <=0.12 SENSITIVE Sensitive     CEFTAZIDIME <=1 SENSITIVE Sensitive     CEFTRIAXONE <=0.25 SENSITIVE Sensitive     CIPROFLOXACIN <=0.25 SENSITIVE Sensitive     GENTAMICIN <=1 SENSITIVE Sensitive     IMIPENEM <=0.25 SENSITIVE Sensitive     TRIMETH/SULFA <=20 SENSITIVE Sensitive     AMPICILLIN/SULBACTAM <=2 SENSITIVE Sensitive     PIP/TAZO <=4 SENSITIVE Sensitive     * ABUNDANT ESCHERICHIA COLI   Streptococcus anginosis - MIC*    PENICILLIN <=0.06 SENSITIVE Sensitive     CEFTRIAXONE 0.5 SENSITIVE Sensitive     ERYTHROMYCIN <=0.12 SENSITIVE Sensitive     LEVOFLOXACIN 0.5 SENSITIVE Sensitive     VANCOMYCIN 1 SENSITIVE Sensitive     * ABUNDANT STREPTOCOCCUS ANGINOSIS     Time coordinating discharge in minutes: 65  SIGNED:   Debbe Odea, MD  [facility] 11/12/2020, 11:03 AM

## 2020-11-13 ENCOUNTER — Other Ambulatory Visit: Payer: Self-pay | Admitting: Family Medicine

## 2020-11-13 ENCOUNTER — Encounter (HOSPITAL_COMMUNITY): Payer: Self-pay | Admitting: Physical Medicine and Rehabilitation

## 2020-11-13 ENCOUNTER — Other Ambulatory Visit: Payer: Self-pay

## 2020-11-13 DIAGNOSIS — S065X9A Traumatic subdural hemorrhage with loss of consciousness of unspecified duration, initial encounter: Secondary | ICD-10-CM | POA: Diagnosis not present

## 2020-11-13 LAB — CBC WITH DIFFERENTIAL/PLATELET
Abs Immature Granulocytes: 0.06 10*3/uL (ref 0.00–0.07)
Basophils Absolute: 0 10*3/uL (ref 0.0–0.1)
Basophils Relative: 0 %
Eosinophils Absolute: 0.2 10*3/uL (ref 0.0–0.5)
Eosinophils Relative: 2 %
HCT: 30.6 % — ABNORMAL LOW (ref 39.0–52.0)
Hemoglobin: 10 g/dL — ABNORMAL LOW (ref 13.0–17.0)
Immature Granulocytes: 1 %
Lymphocytes Relative: 10 %
Lymphs Abs: 1.1 10*3/uL (ref 0.7–4.0)
MCH: 29.4 pg (ref 26.0–34.0)
MCHC: 32.7 g/dL (ref 30.0–36.0)
MCV: 90 fL (ref 80.0–100.0)
Monocytes Absolute: 0.8 10*3/uL (ref 0.1–1.0)
Monocytes Relative: 8 %
Neutro Abs: 8.3 10*3/uL — ABNORMAL HIGH (ref 1.7–7.7)
Neutrophils Relative %: 79 %
Platelets: 404 10*3/uL — ABNORMAL HIGH (ref 150–400)
RBC: 3.4 MIL/uL — ABNORMAL LOW (ref 4.22–5.81)
RDW: 14.6 % (ref 11.5–15.5)
WBC: 10.5 10*3/uL (ref 4.0–10.5)
nRBC: 0 % (ref 0.0–0.2)

## 2020-11-13 LAB — GLUCOSE, CAPILLARY
Glucose-Capillary: 182 mg/dL — ABNORMAL HIGH (ref 70–99)
Glucose-Capillary: 207 mg/dL — ABNORMAL HIGH (ref 70–99)
Glucose-Capillary: 261 mg/dL — ABNORMAL HIGH (ref 70–99)
Glucose-Capillary: 229 mg/dL — ABNORMAL HIGH (ref 70–99)

## 2020-11-13 LAB — COMPREHENSIVE METABOLIC PANEL
ALT: 20 U/L (ref 0–44)
AST: 20 U/L (ref 15–41)
Albumin: 1.7 g/dL — ABNORMAL LOW (ref 3.5–5.0)
Alkaline Phosphatase: 270 U/L — ABNORMAL HIGH (ref 38–126)
Anion gap: 3 — ABNORMAL LOW (ref 5–15)
BUN: 6 mg/dL — ABNORMAL LOW (ref 8–23)
CO2: 30 mmol/L (ref 22–32)
Calcium: 7.7 mg/dL — ABNORMAL LOW (ref 8.9–10.3)
Chloride: 102 mmol/L (ref 98–111)
Creatinine, Ser: 0.44 mg/dL — ABNORMAL LOW (ref 0.61–1.24)
GFR, Estimated: >60 mL/min (ref >60)
Glucose, Bld: 196 mg/dL — ABNORMAL HIGH (ref 70–99)
Potassium: 3.4 mmol/L — ABNORMAL LOW (ref 3.5–5.1)
Sodium: 135 mmol/L (ref 135–145)
Total Bilirubin: 0.9 mg/dL (ref 0.3–1.2)
Total Protein: 5.9 g/dL — ABNORMAL LOW (ref 6.5–8.1)

## 2020-11-13 MED ORDER — INSULIN DETEMIR 100 UNIT/ML ~~LOC~~ SOLN
11.0000 [IU] | Freq: Two times a day (BID) | SUBCUTANEOUS | Status: DC
  Administered 2020-11-13 – 2020-11-14 (×2): 11 [IU] via SUBCUTANEOUS
  Filled 2020-11-13 (×4): qty 0.11

## 2020-11-13 MED ORDER — METHOCARBAMOL 500 MG PO TABS
500.0000 mg | ORAL_TABLET | Freq: Two times a day (BID) | ORAL | Status: DC | PRN
  Administered 2020-11-13 – 2020-11-21 (×3): 500 mg via ORAL
  Filled 2020-11-13 (×3): qty 1

## 2020-11-13 MED ORDER — CHLORHEXIDINE GLUCONATE CLOTH 2 % EX PADS
6.0000 | MEDICATED_PAD | Freq: Two times a day (BID) | CUTANEOUS | Status: DC
  Administered 2020-11-13 – 2020-11-25 (×22): 6 via TOPICAL

## 2020-11-13 MED ORDER — POTASSIUM CHLORIDE CRYS ER 20 MEQ PO TBCR
40.0000 meq | EXTENDED_RELEASE_TABLET | Freq: Once | ORAL | Status: AC
  Administered 2020-11-13: 40 meq via ORAL
  Filled 2020-11-13: qty 2

## 2020-11-13 NOTE — Progress Notes (Signed)
Inpatient Rehabilitation  Patient information reviewed and entered into eRehab system by Hiliana Eilts M. Audris Speaker, M.A., CCC/SLP, PPS Coordinator.  Information including medical coding, functional ability and quality indicators will be reviewed and updated through discharge.    

## 2020-11-13 NOTE — Evaluation (Addendum)
Occupational Therapy Assessment and Plan  Patient Details  Name: Colton Bush MRN: [id number] Date of Birth: Dec 15, 1955  OT Diagnosis: abnormal posture, acute pain, cognitive deficits, muscle weakness (generalized) and swelling of limb   Rehab Potential: Rehab Potential (ACUTE ONLY): Fair ELOS: 3-3.5 weeks   Today's Date: 11/13/2020 OT Individual Time: 1100-1200 OT Individual Time Calculation (min): 60 min     Hospital Problem: Principal Problem:   Subdural hematoma (HCC) Active Problems:   Liver abscess   Bladder outlet obstruction   Seizure in setting of subdural hematoma   Toxic encephalopathy   Past Medical History:  Past Medical History:  Diagnosis Date  . Arthritis    left hand  . Bronchitis 1977  . Cancer ([place]) 03/09/2016   pancreatic cancer, sees Dr. Cristino Martes at [facility]   . Depression    takes Cymbalta daily  . Diabetes mellitus type II    sees Dr. Chalmers Cater   . GERD (gastroesophageal reflux disease)    takes Omeprazole daily  . H/O hiatal hernia   . Hyperlipidemia    takes Zocor daily  . Hypertension    takes Amlodipine daily  . Hypoglycemia 06/18/2017  . Neck pain    C4-7 stenosis and herniated disc  . Neuromuscular disorder ([place])    hiatal hernia  . Scoliosis    slight  . Spinal cord injury, C5-C7 ([place])    c4-c7  . Stiffness of hand joint    d/t cervical issues   Past Surgical History:  Past Surgical History:  Procedure Laterality Date  . ANTERIOR CERVICAL DECOMP/DISCECTOMY FUSION  08/18/2011   Procedure: ANTERIOR CERVICAL DECOMPRESSION/DISCECTOMY FUSION 3 LEVELS;  Surgeon: [place] Cunas, MD;  Location: [place] [facility];  Service: Neurosurgery;  Laterality: N/A;  Anterior Cervical Four-Five/Five-Six/Six-Seven Decompression with Fusion, Plating, and Bonegraft  . BURR HOLE Left 11/09/2020   Procedure: LEFT BURR HOLES FOR EVACUATION OF Subdural Hematoma;  Surgeon: Judith Part, MD;  Location: [place];  Service: Neurosurgery;  Laterality: Left;  .  CARPAL TUNNEL RELEASE  2013   bilateral, per Dr. Christella Noa   . COLONOSCOPY  10-30-14   per Dr. Olevia Perches, clear, repeat in 10 yrs   . egd with esophageal dilation  9-08   per Dr. Olevia Perches  . ERCP N/A 03/01/2016   Procedure: ENDOSCOPIC RETROGRADE CHOLANGIOPANCREATOGRAPHY (ERCP) with brushings and stent;  Surgeon: Doran Stabler, MD;  Location: [facility];  Service: Endoscopy;  Laterality: N/A;  . ESOPHAGOGASTRODUODENOSCOPY (EGD) WITH PROPOFOL N/A 05/27/2020   Procedure: ESOPHAGOGASTRODUODENOSCOPY (EGD) WITH PROPOFOL;  Surgeon: Milus Banister, MD;  Location: [facility];  Service: Endoscopy;  Laterality: N/A;  . EUS N/A 03/09/2016   Procedure: ESOPHAGEAL ENDOSCOPIC ULTRASOUND (EUS) RADIAL;  Surgeon: Milus Banister, MD;  Location: [facility];  Service: Endoscopy;  Laterality: N/A;  . EUS N/A 05/27/2020   Procedure: UPPER ENDOSCOPIC ULTRASOUND (EUS) RADIAL;  Surgeon: Milus Banister, MD;  Location: [facility];  Service: Endoscopy;  Laterality: N/A;  . FEMUR IM NAIL Right 10/25/2020   Procedure: INTRAMEDULLARY (IM) NAIL FEMORAL;  Surgeon: Rod Can, MD;  Location: [facility];  Service: Orthopedics;  Laterality: Right;  . lymph nodes biopsy    . melanoma rt calf  1999  . PORT-A-CATH REMOVAL N/A 04/03/2019   Procedure: PORT REMOVAL;  Surgeon: Stark Klein, MD;  Location: [place];  Service: General;  Laterality: N/A;  . PORTACATH PLACEMENT Left 03/22/2016   Procedure: INSERTION PORT-A-CATH;  Surgeon: Stark Klein, MD;  Location: [facility];  Service:  General;  Laterality: Left;  . SPINE SURGERY    . TONSILLECTOMY     as a child  . ULNAR TUNNEL RELEASE  2013   right arm, per Dr. Christella Noa   . UPPER GASTROINTESTINAL ENDOSCOPY    . WHIPPLE PROCEDURE N/A 09/19/2016   Procedure: DIAGNOSTIC LAPAROSCOPY, LAPAROSCOPIC LIVER BIOPSY, RETROPERITONEAL EXPLORATION, INTRAOPERATIVE ULTRASOUND;  Surgeon: Stark Klein, MD;  Location: [facility];  Service: General;  Laterality: N/A;    Assessment & Plan Clinical  Impression:  65 y.o. male who presented to [facility] as a transfer from [place], in which the pt presented to the ED 4/8 with AMS. Pt was found to be septic and initial source was thought to be another UTI but then further work-up revealed that he had a liver abscess(severe sepsis)  and pneumonia with bacteremia. S/p 2 drains placed in his R liver. EEG revealed L focal slowing and cortical dysfunction arising from L temporal region. MRI of head revealed moderate sized subdural hematoma. S/p L burr hole for evacuation of subdural hematoma 4/12. PMH pacreatic CA dx 2017, DM2 HTN HLD CHF 3/21-31/22 R hip IM repair after fall   Patient currently requires max with basic self-care skills secondary to muscle weakness, decreased cardiorespiratoy endurance, unbalanced muscle activation, decreased coordination and decreased motor planning, decreased attention, decreased awareness, decreased problem solving, decreased safety awareness and decreased memory and decreased sitting balance, decreased standing balance, decreased postural control and decreased balance strategies.  Prior to hospitalization, patient could complete BADL/IAD with independent .  Patient will benefit from skilled intervention to decrease level of assist with basic self-care skills and increase independence with basic self-care skills prior to discharge home with care partner.  Anticipate patient will require 24 hour supervision and minimal physical assistance and follow up home health.  OT - End of Session Activity Tolerance: Tolerates 10 - 20 min activity with multiple rests Endurance Deficit: Yes OT Assessment Rehab Potential (ACUTE ONLY): Fair OT Barriers to Discharge: Inaccessible home environment OT Barriers to Discharge Comments: may need physical A at DC unsure if wife an provide OT Patient demonstrates impairments in the following area(s): Balance;Cognition;Endurance;Motor;Pain;Perception;Safety;Vision;Skin Integrity OT Basic  ADL's Functional Problem(s): Grooming;Bathing;Dressing;Toileting OT Transfers Functional Problem(s): Toilet OT Plan OT Intensity: Minimum of 1-2 x/day, 45 to 90 minutes OT Frequency: 5 out of 7 days OT Duration/Estimated Length of Stay: 3-3.5 weeks OT Treatment/Interventions: Balance/vestibular training;Discharge planning;Pain management;Self Care/advanced ADL retraining;Therapeutic Activities;UE/LE Coordination activities;Visual/perceptual remediation/compensation;Therapeutic Exercise;Skin care/wound managment;Patient/family education;Functional mobility training;Disease mangement/prevention;Cognitive remediation/compensation;Community reintegration;DME/adaptive equipment instruction;Neuromuscular re-education;Psychosocial support;Splinting/orthotics;UE/LE Strength taining/ROM;Wheelchair propulsion/positioning OT Self Feeding Anticipated Outcome(s): S OT Basic Self-Care Anticipated Outcome(s): S UB; MIN A LB OT Toileting Anticipated Outcome(s): CGA OT Bathroom Transfers Anticipated Outcome(s): CGA OT Recommendation Follow Up Recommendations: Home health OT Equipment Recommended: 3 in 1 bedside comode;Tub/shower bench   OT Evaluation Precautions/Restrictions    General Chart Reviewed: Yes Vital Signs   Pain Pain Assessment Pain Scale: Faces Pain Score: 0-No pain Faces Pain Scale: No hurt Home Living/Prior Functioning Home Living Family/patient expects to be discharged to:: Private residence Available Help at Discharge: Family,Available 24 hours/day Type of Home: House Home Access: Stairs to enter [employer] of Steps: (side of house) 1 step then laundry room then 2 steps to main level Entrance Stairs-Rails: None Home Layout: One level Bathroom Shower/Tub: Walk-in Armed forces operational officer: Standard  Lives With: Spouse Prior Function Level of Independence: Independent with basic ADLs,Independent with homemaking with ambulation Vocation:  Retired Vision Baseline Vision/History: Wears glasses Wears Glasses: Reading only  Patient Visual Report:  ("feel like my eyes are getting weak") Vision Assessment?: No apparent visual deficits Perception  Perception: Within Functional Limits Praxis Praxis: Impaired Praxis Impairment Details: Motor planning Cognition Overall Cognitive Status: Impaired/Different from baseline Arousal/Alertness: Awake/alert Orientation Level: Person;Place;Situation Person: Oriented Place: Oriented Situation: Oriented Year: 2022 Month: April Day of Week: Correct Memory: Impaired Memory Impairment: Storage deficit;Retrieval deficit Immediate Memory Recall: Sock;Blue;Bed Memory Recall Sock: Not able to recall Memory Recall Blue: Without Cue Memory Recall Bed: Without Cue Attention: Selective Selective Attention: Impaired Selective Attention Impairment: Verbal complex;Functional complex Awareness: Impaired Awareness Impairment: Emergent impairment Problem Solving: Impaired Problem Solving Impairment: Functional basic;Verbal complex Executive Function: Reasoning Safety/Judgment: Impaired Sensation Sensation Light Touch: Appears Intact Coordination Gross Motor Movements are Fluid and Coordinated: No Fine Motor Movements are Fluid and Coordinated: No Motor  Motor Motor: Abnormal postural alignment and control Motor - Skilled Clinical Observations: global weakness impacting alignement  Trunk/Postural Assessment  Cervical Assessment Cervical Assessment:  (head forward) Thoracic Assessment Thoracic Assessment:  (rounded shoudlers) Lumbar Assessment Lumbar Assessment:  (post pelvic tilt) Postural Control Postural Control: Deficits on evaluation (insufficient)  Balance Balance Balance Assessed: Yes Static Sitting Balance Static Sitting - Level of Assistance: 4: Min assist Static Standing Balance Static Standing - Level of Assistance: 2: Max assist;3: Mod assist Extremity/Trunk  Assessment RUE Assessment RUE Assessment: Exceptions to [facility] General Strength Comments: generalized weakness LUE Assessment LUE Assessment: Exceptions to [facility] General Strength Comments: generalized weakness  Care Tool Care Tool Self Care Eating        Oral Care    Oral Care Assist Level: Minimal Assistance - Patient > 75% (sitting EOB-sitting balnace A)    Bathing   Body parts bathed by patient: Right arm;Left arm;Chest;Abdomen;Face Body parts bathed by helper: Front perineal area;Buttocks;Right upper leg;Left upper leg;Right lower leg;Left lower leg   Assist Level: Maximal Assistance - Patient 24 - 49%    Upper Body Dressing(including orthotics)   What is the patient wearing?: Pull over shirt   Assist Level: Moderate Assistance - Patient 50 - 74%    Lower Body Dressing (excluding footwear)   What is the patient wearing?: Pants;Incontinence brief Assist for lower body dressing: Total Assistance - Patient < 25% (STS in stedy)    Putting on/Taking off footwear   What is the patient wearing?: Non-skid slipper socks Assist for footwear: Dependent - Patient 0%       Care Tool Toileting Toileting activity   Assist for toileting: Total Assistance - Patient < 25%     Care Tool Bed Mobility Roll left and right activity        Sit to lying activity   Sit to lying assist level: Maximal Assistance - Patient 25 - 49%    Lying to sitting edge of bed activity   Lying to sitting edge of bed assist level: Maximal Assistance - Patient 25 - 49%     Care Tool Transfers Sit to stand transfer   Sit to stand assist level: Maximal Assistance - Patient 25 - 49%    Chair/bed transfer   Chair/bed transfer assist level: Dependent - mechanical lift (stedy)     Toilet transfer   Assist Level: Dependent - Patient 0% (stedy)     Care Tool Cognition Expression of Ideas and Wants Expression of Ideas and Wants: Some difficulty - exhibits some difficulty with expressing needs and ideas  (e.g, some words or finishing thoughts) or speech is not clear   Understanding Verbal and Non-Verbal Content Understanding Verbal  and Non-Verbal Content: Usually understands - understands most conversations, but misses some part/intent of message. Requires cues at times to understand   Memory/Recall Ability *first 3 days only Memory/Recall Ability *first 3 days only: Staff names and faces;That he or she is in a hospital/hospital unit    Refer to Care Plan for [place] 1 OT Short Term Goal 1 (Week 1): P twill transfer with +1 A and LRAD ot BSC OT Short Term Goal 2 (Week 1): Pt will sit to stand consistently with MOD A in prep for clothing managment OT Short Term Goal 3 (Week 1): Pt will don shirt iwht MIN A OT Short Term Goal 4 (Week 1): Pt will groom seated EOB with S  Recommendations for other services: None    Skilled Therapeutic Intervention 1:1. Pt received in bed agreeable to OT after edu re OT role/purpose, CIR, ELOS and POC. Pt reporting need to have BM. Toileting, bathing and dressing performed from [facility]. Pt able to STS with MAX A from elevated bed with RW, however unable to take pivot steps. Pt uses stedy MOD-max A STS from all surfaces to transfer. Pt with large stool in [facility] and requires return to bed for hygeine d/t fatigue with prolonged standing unable to hold upright posture. Exited session with pt seated in bed, exit alarm on and call light in reach. Of note while on BSC Pt reporting "chest pain from being in bed" and "chest pain in my back." Vitals assessed and HR 86 O2 94% and BP 135/80. RN aware   ADL ADL Grooming: Minimal assistance Where Assessed-Grooming: Edge of bed Upper Body Bathing: Minimal assistance Where Assessed-Upper Body Bathing:  (bsc) Lower Body Bathing: Maximal assistance (bsc) Upper Body Dressing: Moderate assistance Where Assessed-Upper Body Dressing:  (bsc) Lower Body Dressing: Dependent Where Assessed-Lower Body  Dressing:  (BSC iwth stedy) Toileting: Dependent (stedy) Toilet Transfer: Dependent (stedy- MOD-MAX STS) Mobility  Bed Mobility Bed Mobility: Rolling Right;Rolling Left;Supine to Sit;Sit to Supine Rolling Right: Moderate Assistance - Patient 50-74% Rolling Left: Moderate Assistance - Patient 50-74% Supine to Sit: Maximal Assistance - Patient - Patient 25-49% Sit to Supine: Maximal Assistance - Patient 25-49% Transfers Sit to Stand: Maximal Assistance - Patient 25-49%;Moderate Assistance - Patient 50-74% Stand to Sit: Moderate Assistance - Patient 50-74%   Discharge Criteria: Patient will be discharged from OT if patient refuses treatment 3 consecutive times without medical reason, if treatment goals not met, if there is a change in medical status, if patient makes no progress towards goals or if patient is discharged from hospital.  The above assessment, treatment plan, treatment alternatives and goals were discussed and mutually agreed upon: by patient  Tonny Branch 11/13/2020, 12:28 PM

## 2020-11-13 NOTE — Evaluation (Signed)
Physical Therapy Assessment and Plan  Patient Details  Name: Colton Bush MRN: [id number] Date of Birth: 1956/02/12  PT Diagnosis: Abnormal posture, Abnormality of gait, Difficulty walking and Muscle weakness Rehab Potential: Fair ELOS: 3-3.5 weeks   Today's Date: 11/13/2020 PT Individual Time: 8184-0375 PT Individual Time Calculation (min): 70 min    Hospital Problem: Principal Problem:   Subdural hematoma ([place]) Active Problems:   Liver abscess   Bladder outlet obstruction   Seizure in setting of subdural hematoma   Toxic encephalopathy   Past Medical History:  Past Medical History:  Diagnosis Date  . Arthritis    left hand  . Bronchitis 1977  . Cancer ([place]) 03/09/2016   pancreatic cancer, sees Dr. Cristino Martes at [facility]   . Depression    takes Cymbalta daily  . Diabetes mellitus type II    sees Dr. Chalmers Cater   . GERD (gastroesophageal reflux disease)    takes Omeprazole daily  . H/O hiatal hernia   . Hyperlipidemia    takes Zocor daily  . Hypertension    takes Amlodipine daily  . Hypoglycemia 06/18/2017  . Neck pain    C4-7 stenosis and herniated disc  . Neuromuscular disorder ([place])    hiatal hernia  . Scoliosis    slight  . Spinal cord injury, C5-C7 ([place])    c4-c7  . Stiffness of hand joint    d/t cervical issues   Past Surgical History:  Past Surgical History:  Procedure Laterality Date  . ANTERIOR CERVICAL DECOMP/DISCECTOMY FUSION  08/18/2011   Procedure: ANTERIOR CERVICAL DECOMPRESSION/DISCECTOMY FUSION 3 LEVELS;  Surgeon: Winfield Cunas, MD;  Location: [place] [facility];  Service: Neurosurgery;  Laterality: N/A;  Anterior Cervical Four-Five/Five-Six/Six-Seven Decompression with Fusion, Plating, and Bonegraft  . BURR HOLE Left 11/09/2020   Procedure: LEFT BURR HOLES FOR EVACUATION OF Subdural Hematoma;  Surgeon: Judith Part, MD;  Location: [facility];  Service: Neurosurgery;  Laterality: Left;  . CARPAL TUNNEL RELEASE  2013   bilateral, per Dr. Christella Noa   .  COLONOSCOPY  10-30-14   per Dr. Olevia Perches, clear, repeat in 10 yrs   . egd with esophageal dilation  9-08   per Dr. Olevia Perches  . ERCP N/A 03/01/2016   Procedure: ENDOSCOPIC RETROGRADE CHOLANGIOPANCREATOGRAPHY (ERCP) with brushings and stent;  Surgeon: Doran Stabler, MD;  Location: [facility];  Service: Endoscopy;  Laterality: N/A;  . ESOPHAGOGASTRODUODENOSCOPY (EGD) WITH PROPOFOL N/A 05/27/2020   Procedure: ESOPHAGOGASTRODUODENOSCOPY (EGD) WITH PROPOFOL;  Surgeon: Milus Banister, MD;  Location: [facility];  Service: Endoscopy;  Laterality: N/A;  . EUS N/A 03/09/2016   Procedure: ESOPHAGEAL ENDOSCOPIC ULTRASOUND (EUS) RADIAL;  Surgeon: Milus Banister, MD;  Location: [facility];  Service: Endoscopy;  Laterality: N/A;  . EUS N/A 05/27/2020   Procedure: UPPER ENDOSCOPIC ULTRASOUND (EUS) RADIAL;  Surgeon: Milus Banister, MD;  Location: [facility];  Service: Endoscopy;  Laterality: N/A;  . FEMUR IM NAIL Right 10/25/2020   Procedure: INTRAMEDULLARY (IM) NAIL FEMORAL;  Surgeon: Rod Can, MD;  Location: [facility];  Service: Orthopedics;  Laterality: Right;  . lymph nodes biopsy    . melanoma rt calf  1999  . PORT-A-CATH REMOVAL N/A 04/03/2019   Procedure: PORT REMOVAL;  Surgeon: Stark Klein, MD;  Location: [facility];  Service: General;  Laterality: N/A;  . PORTACATH PLACEMENT Left 03/22/2016   Procedure: INSERTION PORT-A-CATH;  Surgeon: Stark Klein, MD;  Location: [facility];  Service: General;  Laterality: Left;  . SPINE SURGERY    .  TONSILLECTOMY     as a child  . ULNAR TUNNEL RELEASE  2013   right arm, per Dr. Christella Noa   . UPPER GASTROINTESTINAL ENDOSCOPY    . WHIPPLE PROCEDURE N/A 09/19/2016   Procedure: DIAGNOSTIC LAPAROSCOPY, LAPAROSCOPIC LIVER BIOPSY, RETROPERITONEAL EXPLORATION, INTRAOPERATIVE ULTRASOUND;  Surgeon: Stark Klein, MD;  Location: [facility];  Service: General;  Laterality: N/A;    Assessment & Plan Clinical Impression: Patient is a 65 y.o. year old male with history of T2DM (Dr.  Bubba Camp), HTN, SCI, pancreatic cancer with recurrence and recent admission 10/18/20 for right hip fracture s/p ORIF complicated by UTI/strep bacteremia and urinary retention who was d/c to [facility] for rehab. He was readmitted from SNF on 11/05/20 with SIRS and was found to have multiple liver abscesses.  He underwent IR guided aspiration with drain placement revealing strep anginagnosis and Urine culture positive for E coli. Unasyn was recommended by Dr. Tommy Medal.  He continued to have issues with confusion and MRI brain done revealing suspected areas of acute ischemia in left temporal and parietal lobe but with diffusion changes.  CT head revealed late subacute or chronic SDH measuring 10 mm in thickness.  On 04/11, he developed seizures and was loaded with Keppra.  Swallow evaluation done revealing mild to moderate oropharyngeal dysphagia in part due to cognition and aspiration precautions recommended with  Dr. Venetia Constable was consulted for input due to concerns of subdural hemorrhage versus empyema and he was taken to the OR on 04/12 for bur hole evacuation. His antibiotics were changed to metronidazole and ceftriaxone on 04/12 as liver aspiration positive for Klebsiella and E. Coli and concerns of infected SDH. Postop has had significant improvement in speech and mentation and Dr. Leonel Ramsay recommends continuing current dose Keppra and following up with neurology on outpatient basis for wean. Foley was removed this am but he had problems with retention and was cathed for 1300 cc this am therefore foley was replaced. Therapy ongoing and patient continues to be limited  CIR recommended due to functional deficits.  .  Patient transferred to CIR on 11/12/2020 .   Patient currently requires max with mobility secondary to muscle weakness, decreased cardiorespiratoy endurance, decreased motor planning and decreased sitting balance, decreased standing balance, decreased postural control and decreased balance  strategies.  Prior to hospitalization, patient was modified independent  with mobility and lived with Spouse in a House home.  Home access is (side of house) 1 step then laundry room then 2 steps to main levelStairs to enter.  Patient will benefit from skilled PT intervention to maximize safe functional mobility, minimize fall risk and decrease caregiver burden for planned discharge home with 24 hour assist.  Anticipate patient will benefit from follow up [facility] at discharge.  PT - End of Session Activity Tolerance: Tolerates 10 - 20 min activity with multiple rests Endurance Deficit: Yes PT Assessment Rehab Potential (ACUTE/IP ONLY): Fair PT Barriers to Discharge: Inaccessible home environment;Decreased caregiver support;Home environment access/layout;Medication compliance;Behavior PT Barriers to Discharge Comments: poor activity tolernace, step to enter, per pt inability to have WC in full home and limited walker in home as well PT Patient demonstrates impairments in the following area(s): Balance;Behavior;Endurance;Pain;Skin Integrity;Safety;Nutrition PT Transfers Functional Problem(s): Bed Mobility;Bed to Chair;Car PT Locomotion Functional Problem(s): Ambulation;Wheelchair Mobility;Stairs PT Plan PT Intensity: Minimum of 1-2 x/day ,45 to 90 minutes PT Frequency: 5 out of 7 days PT Duration Estimated Length of Stay: 3-3.5 weeks PT Treatment/Interventions: Ambulation/gait training;Balance/vestibular training;Cognitive remediation/compensation;Discharge planning;Disease management/prevention;DME/adaptive equipment instruction;Community reintegration;Pain management;Neuromuscular re-education;Functional  mobility training;Patient/family education;Wheelchair propulsion/positioning;UE/LE Coordination activities;UE/LE Strength taining/ROM;Therapeutic Exercise;Therapeutic Activities;Stair training;Splinting/orthotics;Skin care/wound management;Psychosocial support PT Transfers Anticipated Outcome(s): CGA  with LRAD PT Locomotion Anticipated Outcome(s): CGA with LRAD household PT Recommendation Recommendations for Other Services: Therapeutic Recreation consult Therapeutic Recreation Interventions: Stress management Follow Up Recommendations: Home health PT Patient destination: Home Equipment Recommended: To be determined Equipment Details: pt reports no DME at home   PT Evaluation Precautions/Restrictions   General   Vital SignsTherapy Vitals Temp: 97.6 F (36.4 C) Temp Source: Oral Pulse Rate: 87 Resp: 16 BP: 121/65 Patient Position (if appropriate): Lying Oxygen Therapy SpO2: 96 % O2 Device: Room Air Pain Pain Assessment Pain Scale: 0-10 Pain Score: 0-No pain Faces Pain Scale: No hurt Home Living/Prior Functioning Home Living Available Help at Discharge: Family;Available 24 hours/day Type of Home: House Home Access: Stairs to enter [employer] of Steps: (side of house) 1 step then laundry room then 2 steps to main level Entrance Stairs-Rails: None Home Layout: One level Bathroom Shower/Tub: Walk-in shower;Tub/shower unit Armed forces training and education officer: Yes  Lives With: Spouse Prior Function Level of Independence: Independent with basic ADLs;Independent with homemaking with ambulation  Able to Take Stairs?: No Driving: Yes 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 Praxis: Impaired Praxis Impairment Details: Engineer, production Overall Cognitive Status: Impaired/Different from baseline Arousal/Alertness: Awake/alert Orientation Level: Oriented X4 Attention: Selective Selective Attention: Impaired Selective Attention Impairment: Verbal complex;Functional complex Memory: Impaired Memory Impairment: Storage deficit;Retrieval deficit Awareness: Appears intact Awareness Impairment: Emergent impairment Problem Solving: Appears intact Problem Solving Impairment: Functional  basic;Verbal complex Executive Function: Reasoning Safety/Judgment: Impaired Sensation Sensation Light Touch: Appears Intact Coordination Gross Motor Movements are Fluid and Coordinated: No Fine Motor Movements are Fluid and Coordinated: No Motor  Motor Motor: Abnormal postural alignment and control Motor - Skilled Clinical Observations: global weakness impacting alignement   Trunk/Postural Assessment  Cervical Assessment Cervical Assessment: Exceptions to [facility] (head forward) Thoracic Assessment Thoracic Assessment: Exceptions to [facility] (rounded shoudlers) Lumbar Assessment Lumbar Assessment: Exceptions to [facility] (posterior pelvic tilt) Postural Control Postural Control: Deficits on evaluation (insufficient)  Balance Balance Balance Assessed: Yes Static Sitting Balance Static Sitting - Balance Support: Bilateral upper extremity supported;Feet supported Static Sitting - Level of Assistance: 4: Min assist Dynamic Sitting Balance Dynamic Sitting - Balance Support: Feet supported;Right upper extremity supported Dynamic Sitting - Level of Assistance: 4: Min assist Dynamic Sitting - Balance Activities: Forward lean/weight shifting;Lateral lean/weight shifting;Reaching across midline Static Standing Balance Static Standing - Balance Support: Bilateral upper extremity supported Static Standing - Level of Assistance: 2: Max assist Extremity Assessment  RUE Assessment RUE Assessment: Exceptions to [name] General Strength Comments: generalized weakness LUE Assessment LUE Assessment: Exceptions to [facility] General Strength Comments: generalized weakness RLE Assessment RLE Assessment: Exceptions to [facility] Active Range of Motion (AROM) Comments: limited knee ROM with lacking ~25% flexion, hip flexion lacking 25% ROM RLE Strength Right Hip Flexion: 2/5 Right Hip ABduction: 2+/5 Right Hip ADduction: 2+/5 Right Knee Flexion: 2+/5 Right Knee Extension: 3-/5 Right Ankle Dorsiflexion: 3+/5 Right Ankle  Plantar Flexion: 3+/5 LLE Assessment LLE Assessment: Exceptions to [facility] Active Range of Motion (AROM) Comments: WFL LLE Strength Left Hip Flexion: 3+/5 Left Hip ABduction: 3+/5 Left Hip ADduction: 3+/5 Left Knee Flexion: 3+/5 Left Knee Extension: 4-/5 Left Ankle Dorsiflexion: 4-/5 Left Ankle Plantar Flexion: 4-/5  Care Tool Care Tool Bed Mobility Roll left and right activity   Roll left and right assist level: Moderate Assistance - Patient 50 - 74%  Sit to lying activity   Sit to lying assist level: Maximal Assistance - Patient 25 - 49%    Lying to sitting edge of bed activity   Lying to sitting edge of bed assist level: Maximal Assistance - Patient 25 - 49%     Care Tool Transfers Sit to stand transfer   Sit to stand assist level: Maximal Assistance - Patient 25 - 49%    Chair/bed transfer   Chair/bed transfer assist level: Dependent - mechanical lift (stedy)     Toilet transfer Toilet transfer activity did not occur: Safety/medical concerns Assist Level:  (stedy)    Scientist, product/process development transfer activity did not occur: Safety/medical concerns        Care Tool Locomotion Ambulation Ambulation activity did not occur: Safety/medical concerns        Walk 10 feet activity Walk 10 feet activity did not occur: Safety/medical concerns       Walk 50 feet with 2 turns activity Walk 50 feet with 2 turns activity did not occur: Safety/medical concerns      Walk 150 feet activity Walk 150 feet activity did not occur: Safety/medical concerns      Walk 10 feet on uneven surfaces activity Walk 10 feet on uneven surfaces activity did not occur: Safety/medical concerns      Stairs Stair activity did not occur: Safety/medical concerns        Walk up/down 1 step activity Walk up/down 1 step or curb (drop down) activity did not occur: Safety/medical concerns     Walk up/down 4 steps activity did not occuR: Safety/medical concerns  Walk up/down 4 steps activity      Walk  up/down 12 steps activity Walk up/down 12 steps activity did not occur: Safety/medical concerns      Pick up small objects from floor Pick up small object from the floor (from standing position) activity did not occur: Safety/medical concerns      Wheelchair Will patient use wheelchair at discharge?:  (pt would likely benefit from [facility] assessment and potential use at home however unable to assess for evaluation d/t pt's low tolerance to activity.)   Wheelchair activity did not occur: Safety/medical concerns      Wheel 50 feet with 2 turns activity Wheelchair 50 feet with 2 turns activity did not occur: Safety/medical concerns    Wheel 150 feet activity Wheelchair 150 feet activity did not occur: Safety/medical concerns      Refer to Care Plan for Long Term Goals  SHORT TERM GOAL WEEK 1 PT Short Term Goal 1 (Week 1): pt to demonstrate supine<>sit min A PT Short Term Goal 2 (Week 1): pt to demonstrate bed<>chair transfers mod A with LRAD PT Short Term Goal 3 (Week 1): pt to demonstrate WC mobility 75 min A PT Short Term Goal 4 (Week 1): to initiate gait  Recommendations for other services: Therapeutic Recreation  Stress management  Skilled Therapeutic Intervention  Evaluation completed (see details above and below) with education on PT POC and goals and individual treatment initiated with focus on  Bed mobility, transfer training, safety awareness, call light use. pt received in bed and agreeable to therapy. Pt reported no pain at start and end of session did report during session his R knee feels "swollen" and requested ice pack, this was provided. Pt directed in supine>sit EOB from flat bed with one handrail mod A for rolling and max A to come to EOB. Pt sat EOB for 30 mins min A grossly slight  sway intermittently posterior and pt required frequent rest breaks from activity 2/2 fatigue. Pt directed in x1 Sit to stand from EOB unable to clear with max A to Rolling walker, stedy used with  one Sit to stand completed max A in stedy with VC for increased anterior weight shifting and improved B foot placement to improve ability to clear bed and achieve standing. Pt required quickly to return to sitting EOB reporting he felt his R knee was unable to hold him and he was too tired to attempt longer stance. Pt directed in seated BLE strengthening exercises x10 LAQ, marching at LLE only 2/2 inability to clear bed on RLE and requested to "let this one rest", x10 hip abduction/adduction to pt's tolerance, ankle DF/PF. Pt required frequent rest breaks during exercises 2/2 fatigue but very motivated to participate and improve but demonstrated decreased tolerance to activity. Pt requested to return to supine at end of session 2/2 fatigue, max A sit>supine with need  For trunk and BLE management. Pt directed in upward scooting mod A with hospital bed functions to assist with pt able to scoot self with BLE to assist. Pt left in bed, All needs in reach and in good condition. Call light in hand.  And alarm set. Ice pack placed anteriorly on R knee at pt request and reported comfort. Extra time spent on educating pt on CIR therapy scheduling and planning as pt had questions about this and DC planning which at this time pt's goal is to be as I as possible and return home.      Mobility Bed Mobility Bed Mobility: Rolling Right;Rolling Left;Supine to Sit;Sit to Supine Rolling Right: Moderate Assistance - Patient 50-74% Rolling Left: Moderate Assistance - Patient 50-74% Supine to Sit: Maximal Assistance - Patient - Patient 25-49% Sit to Supine: Maximal Assistance - Patient 25-49% Transfers Transfers: Sit to Stand;Stand to Sit;Stand Pivot Transfers Sit to Stand: Maximal Assistance - Patient 25-49% Stand to Sit: Maximal Assistance - Patient 25-49% Stand Pivot Transfers: Dependent - mechanical lift Transfer (Assistive device): Other (Comment) (stedy) Locomotion  Gait Ambulation: No Gait Gait: No Stairs  / Additional Locomotion Stairs: No Wheelchair Mobility Wheelchair Mobility: No (pt unable to tolerate upright position long enough to attempt WC transfer for assessment)   Discharge Criteria: Patient will be discharged from PT if patient refuses treatment 3 consecutive times without medical reason, if treatment goals not met, if there is a change in medical status, if patient makes no progress towards goals or if patient is discharged from hospital.  The above assessment, treatment plan, treatment alternatives and goals were discussed and mutually agreed upon: by patient  Junie Panning 11/13/2020, 3:23 PM

## 2020-11-13 NOTE — Evaluation (Signed)
Speech Language Pathology Assessment and Plan  Patient Details  Name: Colton Bush MRN: [id number] Date of Birth: Jan 18, 1956  SLP Diagnosis: Cognitive Impairments  Rehab Potential: Good ELOS: 14-16 days    Today's Date: 11/13/2020 SLP Individual Time: 1941-7408 SLP Individual Time Calculation (min): 60 min   Hospital Problem: Principal Problem:   Subdural hematoma (HCC) Active Problems:   Liver abscess   Bladder outlet obstruction   Seizure in setting of subdural hematoma   Toxic encephalopathy  Past Medical History:  Past Medical History:  Diagnosis Date  . Arthritis    left hand  . Bronchitis 1977  . Cancer ([place]) 03/09/2016   pancreatic cancer, sees Dr. Cristino Martes at [facility]   . Depression    takes Cymbalta daily  . Diabetes mellitus type II    sees Dr. Chalmers Cater   . GERD (gastroesophageal reflux disease)    takes Omeprazole daily  . H/O hiatal hernia   . Hyperlipidemia    takes Zocor daily  . Hypertension    takes Amlodipine daily  . Hypoglycemia 06/18/2017  . Neck pain    C4-7 stenosis and herniated disc  . Neuromuscular disorder ([place])    hiatal hernia  . Scoliosis    slight  . Spinal cord injury, C5-C7 ([place])    c4-c7  . Stiffness of hand joint    d/t cervical issues   Past Surgical History:  Past Surgical History:  Procedure Laterality Date  . ANTERIOR CERVICAL DECOMP/DISCECTOMY FUSION  08/18/2011   Procedure: ANTERIOR CERVICAL DECOMPRESSION/DISCECTOMY FUSION 3 LEVELS;  Surgeon: Winfield Cunas, MD;  Location: [place] [facility];  Service: Neurosurgery;  Laterality: N/A;  Anterior Cervical Four-Five/Five-Six/Six-Seven Decompression with Fusion, Plating, and Bonegraft  . BURR HOLE Left 11/09/2020   Procedure: LEFT BURR HOLES FOR EVACUATION OF Subdural Hematoma;  Surgeon: Judith Part, MD;  Location: [place] [facility];  Service: Neurosurgery;  Laterality: Left;  . CARPAL TUNNEL RELEASE  2013   bilateral, per Dr. Christella Noa   . COLONOSCOPY  10-30-14   per Dr. Olevia Perches, clear,  repeat in 10 yrs   . egd with esophageal dilation  9-08   per Dr. Olevia Perches  . ERCP N/A 03/01/2016   Procedure: ENDOSCOPIC RETROGRADE CHOLANGIOPANCREATOGRAPHY (ERCP) with brushings and stent;  Surgeon: Doran Stabler, MD;  Location: [facility];  Service: Endoscopy;  Laterality: N/A;  . ESOPHAGOGASTRODUODENOSCOPY (EGD) WITH PROPOFOL N/A 05/27/2020   Procedure: ESOPHAGOGASTRODUODENOSCOPY (EGD) WITH PROPOFOL;  Surgeon: Milus Banister, MD;  Location: [facility];  Service: Endoscopy;  Laterality: N/A;  . EUS N/A 03/09/2016   Procedure: ESOPHAGEAL ENDOSCOPIC ULTRASOUND (EUS) RADIAL;  Surgeon: Milus Banister, MD;  Location: [facility];  Service: Endoscopy;  Laterality: N/A;  . EUS N/A 05/27/2020   Procedure: UPPER ENDOSCOPIC ULTRASOUND (EUS) RADIAL;  Surgeon: Milus Banister, MD;  Location: [facility];  Service: Endoscopy;  Laterality: N/A;  . FEMUR IM NAIL Right 10/25/2020   Procedure: INTRAMEDULLARY (IM) NAIL FEMORAL;  Surgeon: Rod Can, MD;  Location: [facility];  Service: Orthopedics;  Laterality: Right;  . lymph nodes biopsy    . melanoma rt calf  1999  . PORT-A-CATH REMOVAL N/A 04/03/2019   Procedure: PORT REMOVAL;  Surgeon: Stark Klein, MD;  Location: [facility];  Service: General;  Laterality: N/A;  . PORTACATH PLACEMENT Left 03/22/2016   Procedure: INSERTION PORT-A-CATH;  Surgeon: Stark Klein, MD;  Location: [facility];  Service: General;  Laterality: Left;  . SPINE SURGERY    . TONSILLECTOMY  as a child  . ULNAR TUNNEL RELEASE  2013   right arm, per Dr. Christella Noa   . UPPER GASTROINTESTINAL ENDOSCOPY    . WHIPPLE PROCEDURE N/A 09/19/2016   Procedure: DIAGNOSTIC LAPAROSCOPY, LAPAROSCOPIC LIVER BIOPSY, RETROPERITONEAL EXPLORATION, INTRAOPERATIVE ULTRASOUND;  Surgeon: Stark Klein, MD;  Location: [place];  Service: General;  Laterality: N/A;    Assessment / Plan / Recommendation Clinical Impression   Colton Bush is a 65 year old male with history of T2DM (Dr. Bubba Camp), HTN, SCI,  pancreatic cancer with recurrence and recent admission 10/18/20 for right hip fracture s/p ORIF complicated by UTI/strep bacteremia and urinary retention who was d/c to [facility] for rehab. He was readmitted from SNF on 11/05/20 with SIRS and was found to have multiple liver abscesses.  He underwent IR guided aspiration with drain placement revealing strep anginagnosis and Urine culture positive for E coli. Unasyn was recommended by Dr. Tommy Medal.  He continued to have issues with confusion and MRI brain done revealing suspected areas of acute ischemia in left temporal and parietal lobe but with diffusion changes.  CT head revealed late subacute or chronic SDH measuring 10 mm in thickness.  On 04/11, he developed seizures and was loaded with Keppra.  Swallow evaluation done revealing mild to moderate oropharyngeal dysphagia in part due to cognition.   Pt presents with mild-moderate cognitive linguistic impairment characterized by decreased problem solving, decreased memory, and decreased awareness of deficits. He was able to recall general information regarding recent medical history with min verbal cues. He required increased time with processing for mental calculations and Catering manager subtest. He required partial assistance to complete 2/3 designs and visual cues to identify errors. Moderate verbal cues needed for reasoning physical limitations and tasks he can participate in once home. Pt will benefit from skilled SLP intervention to increase safety and independence for DC home.  Pt recently upgraded to regular diet and thin liquids and is tolerating with no overt s/sx of aspiration or penetration.    Pt reported he notices he gets confused with dates and medical events   Skilled Therapeutic Interventions          Cognistat completed to assess cognitive linguistic skills. Plan of care reviewed with patient.   SLP Assessment  Patient will need skilled [facility] during CIR  admission    Recommendations  Patient destination: Home Follow up Recommendations: Home Health SLP;Outpatient SLP Equipment Recommended: None recommended by SLP    SLP Frequency 3 to 5 out of 7 days   SLP Duration  SLP Intensity  SLP Treatment/Interventions 14-16 days  Minumum of 1-2 x/day, 30 to 90 minutes  Cognitive remediation/compensation;Speech/Language facilitation;Patient/family education    Pain Pain Assessment Pain Scale: Faces Pain Score: 0-No pain Faces Pain Scale: No hurt Patients Stated Pain Goal: 0  Prior Functioning Cognitive/Linguistic Baseline: Within functional limits Type of Home: House  Lives With: Spouse Available Help at Discharge: Family;Available 24 hours/day Vocation: Retired  Programmer, systems Overall Cognitive Status: Impaired/Different from baseline Arousal/Alertness: Awake/alert Orientation Level: Oriented X4 Attention: Selective Selective Attention: Impaired Selective Attention Impairment: Verbal complex;Functional complex Memory: Impaired Memory Impairment: Storage deficit;Retrieval deficit Immediate Memory Recall: Sock;Blue;Bed Memory Recall Sock: Not able to recall Memory Recall Blue: Without Cue Memory Recall Bed: Without Cue Awareness: Impaired Awareness Impairment: Emergent impairment Problem Solving: Impaired Problem Solving Impairment: Functional basic;Verbal complex Executive Function: Reasoning Safety/Judgment: Impaired  Comprehension Auditory Comprehension Overall Auditory Comprehension: Impaired Yes/No Questions: Within Functional Limits Commands: Within Functional Limits  Conversation: Complex Expression Expression Primary Mode of Expression: Verbal Verbal Expression Overall Verbal Expression: Appears within functional limits for tasks assessed Written Expression Dominant Hand: Right Oral Motor Oral Motor/Sensory Function Overall Oral Motor/Sensory Function: Within functional limits  Care Tool Care  Tool Cognition Expression of Ideas and Wants Expression of Ideas and Wants: Without difficulty (complex and basic) - expresses complex messages without difficulty and with speech that is clear and easy to understand   Understanding Verbal and Non-Verbal Content Understanding Verbal and Non-Verbal Content: Usually understands - understands most conversations, but misses some part/intent of message. Requires cues at times to understand   Memory/Recall Ability *first 3 days only           Short Term Goals: Week 1: SLP Short Term Goal 1 (Week 1): Pt will demonstrate anticipatory awareness for home with mod A verbal cues. SLP Short Term Goal 2 (Week 1): Pt will recall 2 events from previous therapy sessions with min A verbal cues. SLP Short Term Goal 3 (Week 1): Pt will selectively attend to functional tasks with Min A verbal cues. SLP Short Term Goal 4 (Week 1): Pt will complete mildly complex problem solving tasks with min A verbal cues.  Refer to Care Plan for Long Term Goals  Recommendations for other services: None   Discharge Criteria: Patient will be discharged from SLP if patient refuses treatment 3 consecutive times without medical reason, if treatment goals not met, if there is a change in medical status, if patient makes no progress towards goals or if patient is discharged from hospital.  The above assessment, treatment plan, treatment alternatives and goals were discussed and mutually agreed upon: by patient  [place] 11/13/2020, 12:05 PM

## 2020-11-13 NOTE — Progress Notes (Signed)
PROGRESS NOTE   Subjective/Complaints: Working with SLP IV is in right forearm Feeling fine Had BM this morning  ROS: Denies constipation  Objective:   No results found. Recent Labs    11/13/20 0447  WBC 10.5  HGB 10.0*  HCT 30.6*  PLT 404*   Recent Labs    11/13/20 0447  NA 135  K 3.4*  CL 102  CO2 30  GLUCOSE 196*  BUN 6*  CREATININE 0.44*  CALCIUM 7.7*    Intake/Output Summary (Last 24 hours) at 11/13/2020 1313 Last data filed at 11/13/2020 0817 Gross per 24 hour  Intake 353 ml  Output --  Net 353 ml     Pressure Injury 11/07/20 Heel Right Stage 1 -  Intact skin with non-blanchable redness of a localized area usually over a bony prominence. (Active)  11/07/20 0800  Location: Heel  Location Orientation: Right  Staging: Stage 1 -  Intact skin with non-blanchable redness of a localized area usually over a bony prominence.  Wound Description (Comments):   Present on Admission:      Pressure Injury 11/07/20 Sacrum Stage 2 -  Partial thickness loss of dermis presenting as a shallow open injury with a red, pink wound bed without slough. 4/15 skin is now broken; was documented as stage 1. now a stage 2. (Active)  11/07/20 2000  Location: Sacrum  Location Orientation:   Staging: Stage 2 -  Partial thickness loss of dermis presenting as a shallow open injury with a red, pink wound bed without slough.  Wound Description (Comments): 4/15 skin is now broken; was documented as stage 1. now a stage 2.  Present on Admission:      Pressure Injury 11/07/20 Heel Right Deep Tissue Pressure Injury - Purple or maroon localized area of discolored intact skin or blood-filled blister due to damage of underlying soft tissue from pressure and/or shear. (Active)  11/07/20 2000  Location: Heel  Location Orientation: Right  Staging: Deep Tissue Pressure Injury - Purple or maroon localized area of discolored intact skin or  blood-filled blister due to damage of underlying soft tissue from pressure and/or shear.  Wound Description (Comments):   Present on Admission:     Physical Exam: Vital Signs Blood pressure 134/73, pulse 77, temperature 98.1 F (36.7 C), temperature source Oral, resp. rate 18, height 5\' 11"  (1.803 m), weight 83 kg, SpO2 92 %. Gen: no distress, normal appearing HEENT: oral mucosa pink and moist, NCAT Cardio: Reg rate Chest: normal effort, normal rate of breathing Abdominal:     General: There is no distension.     Comments: Well healed old midline incision. RUQ drains in place--one with bilious drainage and other with blood tinged drainage.  Soft, slightly TTP diffusely- no rebound; hypoactive BS  Genitourinary:    Penis: Normal.      Comments: Light amber urine in foley as it was placed under sterile conditions Musculoskeletal:     Cervical back: Normal range of motion and neck supple.     Comments: RLE with multiple small stapled incisions.   UE's 5-/5 but equal B/L  LE's - 4+/5 in HF, KE, DF and PF B/L   Skin:  General: Skin is warm and dry.     Comments: RLE incisions as well as abd incisions already noted L forearm/wrist IVs' look OK , stage 2 to sacrum Neurological:     Mental Status: He is oriented to person, place, and time.     Comments: Soft voice with mild left facial weakness. Slow to respond but able to follow basic motor commands.   Slightly perseverative on MS exam, neuro exam Slow to speak- word searching, but used appropriate words Intact to light touch in all 4 extremities  Psychiatric:     Comments: flat    Assessment/Plan: 1. Functional deficits which require 3+ hours per day of interdisciplinary therapy in a comprehensive inpatient rehab setting.  Physiatrist is providing close team supervision and 24 hour management of active medical problems listed below.  Physiatrist and rehab team continue to assess barriers to discharge/monitor patient progress  toward functional and medical goals  Care Tool:  Bathing    Body parts bathed by patient: Right arm,Left arm,Chest,Abdomen,Face   Body parts bathed by helper: Front perineal area,Buttocks,Right upper leg,Left upper leg,Right lower leg,Left lower leg     Bathing assist Assist Level: Maximal Assistance - Patient 24 - 49%     Upper Body Dressing/Undressing Upper body dressing   What is the patient wearing?: Pull over shirt    Upper body assist Assist Level: Moderate Assistance - Patient 50 - 74%    Lower Body Dressing/Undressing Lower body dressing      What is the patient wearing?: Pants,Incontinence brief     Lower body assist Assist for lower body dressing: Total Assistance - Patient < 25% (STS in stedy)     [place] for toileting: Total Assistance - Patient < 25%     Transfers Chair/bed transfer  Transfers assist     Chair/bed transfer assist level: Dependent - mechanical lift (stedy)     Locomotion Ambulation   Ambulation assist              Walk 10 feet activity   Assist           Walk 50 feet activity   Assist           Walk 150 feet activity   Assist           Walk 10 feet on uneven surface  activity   Assist           Wheelchair     Assist               Wheelchair 50 feet with 2 turns activity    Assist            Wheelchair 150 feet activity     Assist          Blood pressure 134/73, pulse 77, temperature 98.1 F (36.7 C), temperature source Oral, resp. rate 18, height 5\' 11"  (1.803 m), weight 83 kg, SpO2 92 %.    Medical Problem List and Plan: 1.  L frontal craniotomy secondary to LDH/SAH from fall             -patient may  Shower if incisions covered             -ELOS/Goals: 2-3 weeks- mod I to supervision  Initial CIR evals today 2.  Antithrombotics: -DVT/anticoagulation:  Pharmaceutical: Lovenox             -antiplatelet therapy:  N/a 3. Pain Management: tylenol prn 4.  Mood: LCSW to follow for evaluation and support.              -antipsychotic agents: N/A 5. Neuropsych: This patient is capable of making decisions on his own behalf. 6. Multiple deep tissue injuries/Skin/Wound Care: Air mattress overlay with pressure relief measures.              --Will add vitamins and collagen to promote wound healing  7. Fluids/Electrolytes/Nutrition: Monitor I/O. Check lytes in am. 8.  SDH (concerns of bacterial infection): S/p Burr hole evacuation   9. Strep anginosis bacteremia: Continue Ceftriaxone/Metronidazole X 6 weeks w/end date  10 Liver abscess s/p JP drain placement: Continue flushes TID and document output. . 11. New onset seizures: To continue Keppra bid till follow up with neuro on outpatient basis.  12. Left hip ORIF: WBAT 13.  T2DM: Hgb A1C- 10.8. Will monitor BS ac/hs. Home regimen Levemir 10u am/15u pm with SSI.              --continue Levemir bid with SSI and titrate insulin as indicated.  4/16: CBG 182-301: increase levemir to 11U 14. BPH/Urinary retention: Foley replaced--keep in place for decompression for few days than start bladder program.              --will increase flomax to 0.8 mg/HS   15. Pancreatic cancer with recurrence: Continue Creon with meals.             --follow up with Hem/onc and Radiation Onc after discharge. 16. Low back pain/muscle spasms- will start Robaxin prn and lidoderm patches 1- 8pm to 8am.   4/16: denies pain: decrease robaxin to q12H prn.  17. Hypokalemia: supplement with 57meq kdur today and repeat BMP Monday.  18. Overweight BMI 25.52: provide counseling.   LOS: 1 days A FACE TO FACE EVALUATION WAS PERFORMED  Colton Bush 11/13/2020, 1:13 PM

## 2020-11-14 DIAGNOSIS — S065X9A Traumatic subdural hemorrhage with loss of consciousness of unspecified duration, initial encounter: Secondary | ICD-10-CM | POA: Diagnosis not present

## 2020-11-14 LAB — GLUCOSE, CAPILLARY
Glucose-Capillary: 122 mg/dL — ABNORMAL HIGH (ref 70–99)
Glucose-Capillary: 226 mg/dL — ABNORMAL HIGH (ref 70–99)
Glucose-Capillary: 172 mg/dL — ABNORMAL HIGH (ref 70–99)
Glucose-Capillary: 273 mg/dL — ABNORMAL HIGH (ref 70–99)

## 2020-11-14 MED ORDER — INSULIN DETEMIR 100 UNIT/ML ~~LOC~~ SOLN
12.0000 [IU] | Freq: Two times a day (BID) | SUBCUTANEOUS | Status: DC
  Administered 2020-11-14 – 2020-11-15 (×2): 12 [IU] via SUBCUTANEOUS
  Filled 2020-11-14 (×5): qty 0.12

## 2020-11-14 MED ORDER — LEVALBUTEROL HCL 0.63 MG/3ML IN NEBU: 0.6300 mg | INHALATION_SOLUTION | Freq: Four times a day (QID) | RESPIRATORY_TRACT | Status: DC | PRN

## 2020-11-14 NOTE — Progress Notes (Signed)
PROGRESS NOTE   Subjective/Complaints: No complaints this morning  ROS: Denies constipation, pain  Objective:   No results found. Recent Labs    11/13/20 0447  WBC 10.5  HGB 10.0*  HCT 30.6*  PLT 404*   Recent Labs    11/13/20 0447  NA 135  K 3.4*  CL 102  CO2 30  GLUCOSE 196*  BUN 6*  CREATININE 0.44*  CALCIUM 7.7*    Intake/Output Summary (Last 24 hours) at 11/14/2020 1458 Last data filed at 11/14/2020 1200 Gross per 24 hour  Intake 470 ml  Output 3074 ml  Net -2604 ml     Pressure Injury 11/07/20 Heel Right Stage 1 -  Intact skin with non-blanchable redness of a localized area usually over a bony prominence. (Active)  11/07/20 0800  Location: Heel  Location Orientation: Right  Staging: Stage 1 -  Intact skin with non-blanchable redness of a localized area usually over a bony prominence.  Wound Description (Comments):   Present on Admission:      Pressure Injury 11/07/20 Sacrum Stage 2 -  Partial thickness loss of dermis presenting as a shallow open injury with a red, pink wound bed without slough. 4/15 skin is now broken; was documented as stage 1. now a stage 2. (Active)  11/07/20 2000  Location: Sacrum  Location Orientation:   Staging: Stage 2 -  Partial thickness loss of dermis presenting as a shallow open injury with a red, pink wound bed without slough.  Wound Description (Comments): 4/15 skin is now broken; was documented as stage 1. now a stage 2.  Present on Admission:      Pressure Injury 11/07/20 Heel Right Deep Tissue Pressure Injury - Purple or maroon localized area of discolored intact skin or blood-filled blister due to damage of underlying soft tissue from pressure and/or shear. (Active)  11/07/20 2000  Location: Heel  Location Orientation: Right  Staging: Deep Tissue Pressure Injury - Purple or maroon localized area of discolored intact skin or blood-filled blister due to damage of  underlying soft tissue from pressure and/or shear.  Wound Description (Comments):   Present on Admission:     Physical Exam: Vital Signs Blood pressure 129/67, pulse 91, temperature 97.8 F (36.6 C), resp. rate 18, height 5\' 11"  (1.803 m), weight 83 kg, SpO2 92 %. Gen: no distress, normal appearing HEENT: oral mucosa pink and moist, NCAT Cardio: Reg rate Chest: normal effort, normal rate of breathing Abdominal:     General: There is no distension.     Comments: Well healed old midline incision. RUQ drains in place--one with bilious drainage and other with blood tinged drainage.  Soft, slightly TTP diffusely- no rebound; hypoactive BS  Genitourinary:    Penis: Normal.      Comments: Light amber urine in foley as it was placed under sterile conditions Musculoskeletal:     Cervical back: Normal range of motion and neck supple.     Comments: RLE with multiple small stapled incisions.   UE's 5-/5 but equal B/L  LE's - 4+/5 in HF, KE, DF and PF B/L   Skin:    General: Skin is warm and dry.  Comments: RLE incisions as well as abd incisions already noted L forearm/wrist IVs' look OK , stage 2 to sacrum Neurological:     Mental Status: He is oriented to person, place, and time.     Comments: Soft voice with mild left facial weakness. Slow to respond but able to follow basic motor commands.   Slightly perseverative on MS exam, neuro exam Slow to speak- word searching, but used appropriate words Intact to light touch in all 4 extremities  Psychiatric:     Comments: flat    Assessment/Plan: 1. Functional deficits which require 3+ hours per day of interdisciplinary therapy in a comprehensive inpatient rehab setting.  Physiatrist is providing close team supervision and 24 hour management of active medical problems listed below.  Physiatrist and rehab team continue to assess barriers to discharge/monitor patient progress toward functional and medical goals  Care Tool:  Bathing     Body parts bathed by patient: Right arm,Left arm,Chest,Abdomen,Face   Body parts bathed by helper: Front perineal area,Buttocks,Right upper leg,Left upper leg,Right lower leg,Left lower leg     Bathing assist Assist Level: Maximal Assistance - Patient 24 - 49%     Upper Body Dressing/Undressing Upper body dressing   What is the patient wearing?: Pull over shirt    Upper body assist Assist Level: Moderate Assistance - Patient 50 - 74%    Lower Body Dressing/Undressing Lower body dressing      What is the patient wearing?: Pants,Incontinence brief     Lower body assist Assist for lower body dressing: Total Assistance - Patient < 25% (STS in stedy)     [place] for toileting: Total Assistance - Patient < 25%     Transfers Chair/bed transfer  Transfers assist     Chair/bed transfer assist level: Dependent - mechanical lift (stedy)     Locomotion Ambulation   Ambulation assist   Ambulation activity did not occur: Safety/medical concerns          Walk 10 feet activity   Assist  Walk 10 feet activity did not occur: Safety/medical concerns        Walk 50 feet activity   Assist Walk 50 feet with 2 turns activity did not occur: Safety/medical concerns         Walk 150 feet activity   Assist Walk 150 feet activity did not occur: Safety/medical concerns         Walk 10 feet on uneven surface  activity   Assist Walk 10 feet on uneven surfaces activity did not occur: Safety/medical concerns         Wheelchair     Assist Will patient use wheelchair at discharge?:  (pt would likely benefit from [facility] assessment and potential use at home however unable to assess for evaluation d/t pt's low tolerance to activity.)   Wheelchair activity did not occur: Safety/medical concerns         Wheelchair 50 feet with 2 turns activity    Assist    Wheelchair 50 feet with 2 turns activity did not occur:  Safety/medical concerns       Wheelchair 150 feet activity     Assist  Wheelchair 150 feet activity did not occur: Safety/medical concerns       Blood pressure 129/67, pulse 91, temperature 97.8 F (36.6 C), resp. rate 18, height 5\' 11"  (1.803 m), weight 83 kg, SpO2 92 %.    Medical Problem List and Plan: 1.  L frontal  craniotomy secondary to LDH/SAH from fall             -patient may  Shower if incisions covered             -ELOS/Goals: 2-3 weeks- mod I to supervision  Continue CIR 2.  Antithrombotics: -DVT/anticoagulation:  Pharmaceutical: Lovenox             -antiplatelet therapy: N/a 3. Pain Management: tylenol prn 4. Mood: LCSW to follow for evaluation and support.              -antipsychotic agents: N/A 5. Neuropsych: This patient is capable of making decisions on his own behalf. 6. Multiple deep tissue injuries/Skin/Wound Care: Continue Air mattress overlay with pressure relief measures.              --Will add vitamins and collagen to promote wound healing  7. Fluids/Electrolytes/Nutrition: Monitor I/O. Check lytes in am. 8.  SDH (concerns of bacterial infection): S/p Burr hole evacuation   9. Strep anginosis bacteremia: Continue Ceftriaxone/Metronidazole X 6 weeks w/end date  10 Liver abscess s/p JP drain placement: Continue flushes TID and document output. . 11. New onset seizures: Continue Keppra bid till follow up with neuro on outpatient basis.  12. Left hip ORIF: WBAT 13.  T2DM: Hgb A1C- 10.8. Will monitor BS ac/hs. Home regimen Levemir 10u am/15u pm with SSI.              --continue Levemir bid with SSI and titrate insulin as indicated.  4/17: CBG better at 207-261 increase levemir to 12U 14. BPH/Urinary retention: Foley replaced--keep in place for decompression for few days than start bladder program.              --will increase flomax to 0.8 mg/HS   15. Pancreatic cancer with recurrence: Continue Creon with meals.             --follow up with Hem/onc and  Radiation Onc after discharge. 16. Low back pain/muscle spasms- will start Robaxin prn and lidoderm patches 1- 8pm to 8am.   4/16: denies pain: decrease robaxin to q12H prn.  17. Hypokalemia: supplement with 27meq kdur today and repeat BMP Monday.  18. Overweight BMI 25.52: provide counseling.   LOS: 2 days A FACE TO FACE EVALUATION WAS PERFORMED  Clide Deutscher Jeromy Borcherding 11/14/2020, 2:58 PM

## 2020-11-15 ENCOUNTER — Ambulatory Visit: Payer: Medicare Other | Admitting: Radiation Oncology

## 2020-11-15 DIAGNOSIS — S065X9A Traumatic subdural hemorrhage with loss of consciousness of unspecified duration, initial encounter: Secondary | ICD-10-CM | POA: Diagnosis not present

## 2020-11-15 LAB — COMPREHENSIVE METABOLIC PANEL
ALT: 23 U/L (ref 0–44)
AST: 24 U/L (ref 15–41)
Albumin: 1.7 g/dL — ABNORMAL LOW (ref 3.5–5.0)
Alkaline Phosphatase: 660 U/L — ABNORMAL HIGH (ref 38–126)
Anion gap: 6 (ref 5–15)
BUN: 8 mg/dL (ref 8–23)
CO2: 28 mmol/L (ref 22–32)
Calcium: 8.1 mg/dL — ABNORMAL LOW (ref 8.9–10.3)
Chloride: 105 mmol/L (ref 98–111)
Creatinine, Ser: 0.48 mg/dL — ABNORMAL LOW (ref 0.61–1.24)
GFR, Estimated: >60 mL/min (ref >60)
Glucose, Bld: 215 mg/dL — ABNORMAL HIGH (ref 70–99)
Potassium: 3.2 mmol/L — ABNORMAL LOW (ref 3.5–5.1)
Sodium: 139 mmol/L (ref 135–145)
Total Bilirubin: 0.5 mg/dL (ref 0.3–1.2)
Total Protein: 6 g/dL — ABNORMAL LOW (ref 6.5–8.1)

## 2020-11-15 LAB — CBC WITH DIFFERENTIAL/PLATELET
Abs Immature Granulocytes: 0.03 10*3/uL (ref 0.00–0.07)
Basophils Absolute: 0.1 10*3/uL (ref 0.0–0.1)
Basophils Relative: 1 %
Eosinophils Absolute: 0.3 10*3/uL (ref 0.0–0.5)
Eosinophils Relative: 4 %
HCT: 30.7 % — ABNORMAL LOW (ref 39.0–52.0)
Hemoglobin: 9.9 g/dL — ABNORMAL LOW (ref 13.0–17.0)
Immature Granulocytes: 0 %
Lymphocytes Relative: 13 %
Lymphs Abs: 1 10*3/uL (ref 0.7–4.0)
MCH: 29.2 pg (ref 26.0–34.0)
MCHC: 32.2 g/dL (ref 30.0–36.0)
MCV: 90.6 fL (ref 80.0–100.0)
Monocytes Absolute: 0.7 10*3/uL (ref 0.1–1.0)
Monocytes Relative: 9 %
Neutro Abs: 5.9 10*3/uL (ref 1.7–7.7)
Neutrophils Relative %: 73 %
Platelets: 470 10*3/uL — ABNORMAL HIGH (ref 150–400)
RBC: 3.39 MIL/uL — ABNORMAL LOW (ref 4.22–5.81)
RDW: 14.9 % (ref 11.5–15.5)
WBC: 7.9 10*3/uL (ref 4.0–10.5)
nRBC: 0 % (ref 0.0–0.2)

## 2020-11-15 LAB — GLUCOSE, CAPILLARY
Glucose-Capillary: 193 mg/dL — ABNORMAL HIGH (ref 70–99)
Glucose-Capillary: 225 mg/dL — ABNORMAL HIGH (ref 70–99)
Glucose-Capillary: 217 mg/dL — ABNORMAL HIGH (ref 70–99)
Glucose-Capillary: 185 mg/dL — ABNORMAL HIGH (ref 70–99)

## 2020-11-15 MED ORDER — POTASSIUM CHLORIDE CRYS ER 20 MEQ PO TBCR
40.0000 meq | EXTENDED_RELEASE_TABLET | Freq: Two times a day (BID) | ORAL | Status: AC
  Administered 2020-11-15 – 2020-11-16 (×2): 40 meq via ORAL
  Filled 2020-11-15 (×2): qty 2

## 2020-11-15 MED ORDER — SODIUM CHLORIDE 0.9 % IV SOLN: INTRAVENOUS | Status: DC | PRN

## 2020-11-15 MED ORDER — INSULIN DETEMIR 100 UNIT/ML ~~LOC~~ SOLN
13.0000 [IU] | Freq: Two times a day (BID) | SUBCUTANEOUS | Status: DC
  Administered 2020-11-15 – 2020-11-23 (×17): 13 [IU] via SUBCUTANEOUS
  Filled 2020-11-15 (×19): qty 0.13

## 2020-11-15 NOTE — IPOC Note (Signed)
Overall Plan of Care [facility]) Patient Details Name: Colton Bush MRN: [id number] DOB: [birth year]  Admitting Diagnosis: Subdural hematoma [facility])  Hospital Problems: Principal Problem:   Subdural hematoma ([place]) Active Problems:   Liver abscess   Bladder outlet obstruction   Seizure in setting of subdural hematoma   Toxic encephalopathy     Functional Problem List: Nursing Bladder,Bowel,Endurance,Medication Management,Motor,Pain,Skin Integrity,Safety  PT Balance,Behavior,Endurance,Pain,Skin Integrity,Safety,Nutrition  OT Balance,Cognition,Endurance,Motor,Pain,Perception,Safety,Vision,Skin Integrity  SLP Cognition  TR         Basic ADL's: OT Grooming,Bathing,Dressing,Toileting     Advanced  ADL's: OT       Transfers: PT Bed Mobility,Bed to [employer]  OT Toilet     Locomotion: PT Ambulation,Wheelchair Mobility,Stairs     Additional Impairments: OT    SLP Social Cognition   Problem Solving,Memory,Attention  TR      Anticipated Outcomes Item Anticipated Outcome  Self Feeding S  Swallowing      Basic self-care  S UB; MIN A LB  Toileting  CGA   Bathroom Transfers CGA  Bowel/Bladder  Pt will maintain bowel continence and remain free of CAUTI during hospital stay.  Transfers  CGA with LRAD  Locomotion  CGA with LRAD household  Communication     Cognition  Mod I  Pain  Pt will maintain manageable level of pain during hospital stay.  Safety/Judgment  Pt will maintain safety/judgement awareness during hospital stay.   Therapy Plan: PT Intensity: Minimum of 1-2 x/day ,45 to 90 minutes PT Frequency: 5 out of 7 days PT Duration Estimated Length of Stay: 3-3.5 weeks OT Intensity: Minimum of 1-2 x/day, 45 to 90 minutes OT Frequency: 5 out of 7 days OT Duration/Estimated Length of Stay: 3-3.5 weeks SLP Intensity: Minumum of 1-2 x/day, 30 to 90 minutes SLP Frequency: 3 to 5 out of 7 days SLP Duration/Estimated Length of Stay: 14-16 days   Due to the current  state of emergency, patients may not be receiving their 3-hours of Medicare-mandated therapy.   Team Interventions: Nursing Interventions Patient/Family Education,Pain Management,Medication Management,Bowel Management,Bladder Management,Skin Care/Wound Multimedia programmer Planning  PT interventions Ambulation/gait training,Balance/vestibular training,Cognitive remediation/compensation,Discharge planning,Disease Horticulturist, commercial re-education,Functional mobility training,Patient/family education,Wheelchair propulsion/positioning,UE/LE Coordination activities,UE/LE Strength taining/ROM,Therapeutic Exercise,Therapeutic Activities,Stair training,Splinting/orthotics,Skin care/wound Financial controller support  OT Interventions Balance/vestibular training,Discharge planning,Pain management,Self Care/advanced ADL retraining,Therapeutic Activities,UE/LE Coordination activities,Visual/perceptual remediation/compensation,Therapeutic Exercise,Skin care/wound managment,Patient/family education,Functional mobility training,Disease mangement/prevention,Cognitive remediation/compensation,Community Corporate treasurer re-education,Psychosocial support,Splinting/orthotics,UE/LE Strength taining/ROM,Wheelchair propulsion/positioning  SLP Interventions Cognitive remediation/compensation,Speech/Language facilitation,Patient/family education  TR Interventions    SW/CM Interventions Discharge Planning,Psychosocial Support,Patient/Family Education   Barriers to Discharge MD  Medical stability  Nursing Decreased caregiver support,Home environment access/layout,Incontinence,Wound Care,Lack of/limited family support,Weight bearing restrictions,Medication compliance,Behavior    PT Inaccessible home environment,Decreased caregiver support,Home environment  access/layout,Medication compliance,Behavior poor activity tolernace, step to enter, per pt inability to have WC in full home and limited walker in home as well  OT Inaccessible home environment may need physical A at DC unsure if wife an provide  SLP      SW Decreased caregiver support,Lack of/limited family support     Team Discharge Planning: Destination: PT-Home ,OT-   , SLP-Home Projected Follow-up: PT-Home health PT, OT-  Home health OT, SLP-Home Health SLP,Outpatient SLP Projected Equipment Needs: PT-To be determined, OT- 3 in 1 bedside comode,Tub/shower bench, SLP-None recommended by SLP Equipment Details: PT-pt reports no DME at home, OT-  Patient/family involved in discharge planning: PT- Patient,  OT-Patient, SLP-   MD ELOS: 2-3 weeks modI Medical Rehab Prognosis:  Excellent Assessment: [name] is a 65 year old man admitted to CIR with a left frontal craniotomy secondary to LDH/SAH from a fall. Active medical issues include hypokalemia, low back pain/muscle spasms, pancreatic cancer with recurrence, type 2 DM, left hip ORIF. Medications are being adjusted, labs and vitals are being monitored regularly.       See Team Conference Notes for weekly updates to the plan of care

## 2020-11-15 NOTE — Progress Notes (Signed)
Physical Therapy Session Note  Patient Details  Name: [name] MRN: [id number] Date of Birth: 05/07/1956  Today's Date: 11/15/2020 PT Individual Time: 1430-1520 PT Individual Time Calculation (min): 50 min   Short Term Goals: Week 1:  PT Short Term Goal 1 (Week 1): pt to demonstrate supine<>sit min A PT Short Term Goal 2 (Week 1): pt to demonstrate bed<>chair transfers mod A with LRAD PT Short Term Goal 3 (Week 1): pt to demonstrate WC mobility 75 min A PT Short Term Goal 4 (Week 1): to initiate gait  Skilled Therapeutic Interventions/Progress Updates:    Pt received seated in bed, agreeable to extra therapy session. No complaints of pain at rest, has onset of low back pain with mobility. Pt currently has lidocaine patch in place to low back, declines any other intervention at this time. Supine to sit with min A needed for trunk control, increased time and heavy reliance on hospital bed features. Sit to stand with mod A to stedy from elevated bed. Stedy transfer to w/c. Sit to stand with max A to stedy from w/c seat height. Standing mini-squats 2 x 5 reps in stedy with max cues to obtain upright standing posture. Sit to stand in // bars x 2 reps with max A and B knees blocked. Pt only tolerates standing about 15 sec each rep due to pain and fatigue. Pt left seated in w/c in room with needs in reach at end of session.  Therapy Documentation Precautions:  Restrictions Weight Bearing Restrictions: Yes RLE Weight Bearing: Weight bearing as tolerated   Therapy/Group: Individual Therapy   Excell Seltzer, PT, DPT, CSRS  11/15/2020, 3:33 PM

## 2020-11-15 NOTE — Progress Notes (Signed)
PROGRESS NOTE   Subjective/Complaints: No complaints this morning. K+ 3.2- supplement and repeat tomorrow.  Highly motivated Low back pain with mobility  ROS: +low back pain  Objective:   No results found. Recent Labs    11/13/20 0447 11/15/20 0519  WBC 10.5 7.9  HGB 10.0* 9.9*  HCT 30.6* 30.7*  PLT 404* 470*   Recent Labs    11/13/20 0447 11/15/20 0519  NA 135 139  K 3.4* 3.2*  CL 102 105  CO2 30 28  GLUCOSE 196* 215*  BUN 6* 8  CREATININE 0.44* 0.48*  CALCIUM 7.7* 8.1*    Intake/Output Summary (Last 24 hours) at 11/15/2020 1730 Last data filed at 11/15/2020 1713 Gross per 24 hour  Intake 520 ml  Output 1292 ml  Net -772 ml     Pressure Injury 11/07/20 Heel Right Stage 1 -  Intact skin with non-blanchable redness of a localized area usually over a bony prominence. (Active)  11/07/20 0800  Location: Heel  Location Orientation: Right  Staging: Stage 1 -  Intact skin with non-blanchable redness of a localized area usually over a bony prominence.  Wound Description (Comments):   Present on Admission:      Pressure Injury 11/07/20 Sacrum Stage 2 -  Partial thickness loss of dermis presenting as a shallow open injury with a red, pink wound bed without slough. 4/15 skin is now broken; was documented as stage 1. now a stage 2. (Active)  11/07/20 2000  Location: Sacrum  Location Orientation:   Staging: Stage 2 -  Partial thickness loss of dermis presenting as a shallow open injury with a red, pink wound bed without slough.  Wound Description (Comments): 4/15 skin is now broken; was documented as stage 1. now a stage 2.  Present on Admission:      Pressure Injury 11/07/20 Heel Right Deep Tissue Pressure Injury - Purple or maroon localized area of discolored intact skin or blood-filled blister due to damage of underlying soft tissue from pressure and/or shear. (Active)  11/07/20 2000  Location: Heel  Location  Orientation: Right  Staging: Deep Tissue Pressure Injury - Purple or maroon localized area of discolored intact skin or blood-filled blister due to damage of underlying soft tissue from pressure and/or shear.  Wound Description (Comments):   Present on Admission:     Physical Exam: Vital Signs Blood pressure 140/74, pulse 78, temperature 98.2 F (36.8 C), temperature source Oral, resp. rate 16, height 5\' 11"  (1.803 m), weight 83 kg, SpO2 93 %. Gen: no distress, normal appearing HEENT: oral mucosa pink and moist, NCAT Cardio: Reg rate Chest: normal effort, normal rate of breathing Abdominal:  General: There is no distension.  Comments: Well healed old midline incision. RUQ drains in place--one with bilious drainage and other with blood tinged drainage.  Soft, slightly TTP diffusely- no rebound; hypoactive BS Genitourinary: Penis: Normal.  Comments: Light amber urine in foley as it was placed under sterile conditions Musculoskeletal:  Cervical back: Normal range of motionand neck supple.  Comments: RLE with multiple small stapled incisions.   UE's 5-/5 but equal B/L  LE's - 4+/5 in HF, KE, DF and PF B/L  Skin: General:  Skin is warmand dry.  Comments: RLE incisions as well as abd incisions already noted L forearm/wrist IVs' look OK, stage 2 to sacrum Neurological:  Mental Status: He is oriented to person, place, and time.  Comments: Soft voice with mild left facial weakness. Slow to respond but able to follow basic motor commands.   Slightly perseverative on MS exam, neuro exam Slow to speak- word searching, but used appropriate words Intact to light touch in all 4 extremities Psychiatric:  Comments: flat   Assessment/Plan: 1. Functional deficits which require 3+ hours per day of interdisciplinary therapy in a comprehensive inpatient rehab setting.  Physiatrist is providing close team supervision and 24 hour management of active  medical problems listed below.  Physiatrist and rehab team continue to assess barriers to discharge/monitor patient progress toward functional and medical goals  Care Tool:  Bathing    Body parts bathed by patient: Right arm,Left arm,Chest,Abdomen,Face   Body parts bathed by helper: Front perineal area,Buttocks,Right upper leg,Left upper leg,Right lower leg,Left lower leg     Bathing assist Assist Level: Maximal Assistance - Patient 24 - 49%     Upper Body Dressing/Undressing Upper body dressing   What is the patient wearing?: Pull over shirt    Upper body assist Assist Level: Moderate Assistance - Patient 50 - 74%    Lower Body Dressing/Undressing Lower body dressing      What is the patient wearing?: Pants,Incontinence brief     Lower body assist Assist for lower body dressing: Total Assistance - Patient < 25% (STS in stedy)     [place] assist Assist for toileting: 2 Helpers     Transfers Chair/bed transfer  Transfers assist     Chair/bed transfer assist level: Dependent - mechanical lift (stedy)     Locomotion Ambulation   Ambulation assist   Ambulation activity did not occur: Safety/medical concerns          Walk 10 feet activity   Assist  Walk 10 feet activity did not occur: Safety/medical concerns        Walk 50 feet activity   Assist Walk 50 feet with 2 turns activity did not occur: Safety/medical concerns         Walk 150 feet activity   Assist Walk 150 feet activity did not occur: Safety/medical concerns         Walk 10 feet on uneven surface  activity   Assist Walk 10 feet on uneven surfaces activity did not occur: Safety/medical concerns         Wheelchair     Assist Will patient use wheelchair at discharge?: Yes Type of Wheelchair: Manual Wheelchair activity did not occur: Safety/medical concerns  Wheelchair assist level: Minimal Assistance - Patient > 75% Max wheelchair  distance: 70'    Wheelchair 50 feet with 2 turns activity    Assist    Wheelchair 50 feet with 2 turns activity did not occur: Safety/medical concerns   Assist Level: Minimal Assistance - Patient > 75%   Wheelchair 150 feet activity     Assist  Wheelchair 150 feet activity did not occur: Safety/medical concerns       Blood pressure 140/74, pulse 78, temperature 98.2 F (36.8 C), temperature source Oral, resp. rate 16, height 5\' 11"  (1.803 m), weight 83 kg, SpO2 93 %.  Medical Problem List and Plan: 1.L frontal craniotomysecondary to LDH/SAH from fall -patient may Shower if incisions covered -ELOS/Goals: 2-3 weeks- mod I to supervision  Continue CIR 2. Impaired mobility: -DVT/anticoagulation: Pharmaceutical: Continue Lovenox -antiplatelet therapy: N/a 3. Pain Management: tylenol prn 4. Mood: LCSW to follow for evaluation and support.  -antipsychotic agents: N/A 5. Neuropsych: This patient is capable of making decisions on his own behalf. 6. Multiple deep tissue injuries/Skin/Wound Care: Continue Air mattress overlay with pressure relief measures.  --Continue vitamins and collagen to promote wound healing  7. Fluids/Electrolytes/Nutrition: Monitor I/O. Check lytes in am. 8. SDH (concerns of bacterial infection): S/p Burr hole evacuation  9. Strep anginosis bacteremia: Continue Ceftriaxone/Metronidazole X 6 weeks w/end date  10 Liver abscess s/p JP drain placement: Continue flushes TID and document output. . 11. New onset seizures: Continue Keppra bid till follow up with neuro on outpatient basis.  12. Left hip ORIF: WBAT 13. T2DM: Hgb A1C- 10.8. Will monitor BS ac/hs. Home regimen Levemir 10u am/15u pm with SSI.  --continue Levemir bid with SSI and titrate insulin as indicated.           CBG 193-273: increase levemir to 13U 14. BPH/Urinary retention: Foley replaced--keep in  place for decompression for few days than start bladder program.  --will increase flomax to 0.8 mg/HS  15. Pancreatic cancer with recurrence: Continue Creon with meals. --follow up with Hem/onc and Radiation Onc after discharge. 16. Low back pain/muscle spasms- will start Robaxin prn and lidoderm patches 1- 8pm to 8am.              4/16: denies pain: decrease robaxin to q12H prn.  17. Hypokalemia: supplement with 88meq kdur today and repeat BMP Monday.  18. Overweight BMI 25.52: provide counseling.     LOS: 3 days A FACE TO FACE EVALUATION WAS PERFORMED  Martha Clan P Meira Wahba 11/15/2020, 5:30 PM

## 2020-11-15 NOTE — Progress Notes (Signed)
Physical Therapy Session Note  Patient Details  Name: Colton Bush MRN: [id number] Date of Birth: 08-09-1955  Today's Date: 11/15/2020 PT Individual Time: 0900-0945 PT Individual Time Calculation (min): 45 min   Short Term Goals: Week 1:  PT Short Term Goal 1 (Week 1): pt to demonstrate supine<>sit min A PT Short Term Goal 2 (Week 1): pt to demonstrate bed<>chair transfers mod A with LRAD PT Short Term Goal 3 (Week 1): pt to demonstrate WC mobility 75 min A PT Short Term Goal 4 (Week 1): to initiate gait  Skilled Therapeutic Interventions/Progress Updates:    Pt received seated in bed, agreeable to PT session. No complaints of pain at rest, pt reports onset of low back, neck, and R knee pain with mobility. Nursing able to provide pain medication at end of session. Provided ice pack to R knee at end of session for pain management. rolling L/R with max A for donning of pants dependently. Supine to sit with mod A needed for trunk elevation. Sit to stand with mod A from elevated bed to stedy. Stedy transfer to w/c. Sit to stand with max A to stedy from w/c. Sit to stand with min A from perched stedy seat. Pt able to tolerate standing about 30 sec before onset of fatigue and R knee pain. Manual w/c propulsion x 50 ft with use of BUE at min A level before onset of fatigue. Pt left seated in w/c in room with needs in reach at end of session.  Therapy Documentation Precautions:  Restrictions Weight Bearing Restrictions: Yes RLE Weight Bearing: Weight bearing as tolerated   Therapy/Group: Individual Therapy   Excell Seltzer, PT, DPT, CSRS  11/15/2020, 11:59 AM

## 2020-11-15 NOTE — Progress Notes (Signed)
Occupational Therapy Session Note  Patient Details  Name: Colton Bush MRN: [id number] Date of Birth: 1955-08-18  Today's Date: 11/15/2020 OT Individual Time: 1100-1155 OT Individual Time Calculation (min): 55 min    Short Term Goals: Week 1:  OT Short Term Goal 1 (Week 1): P twill transfer with +1 A and LRAD ot BSC OT Short Term Goal 2 (Week 1): Pt will sit to stand consistently with MOD A in prep for clothing managment OT Short Term Goal 3 (Week 1): Pt will don shirt iwht MIN A OT Short Term Goal 4 (Week 1): Pt will groom seated EOB with S  Skilled Therapeutic Interventions/Progress Updates:    Pt seated in w/c upon arrival and requesting to return to bed. OT intervention with focus on sit<>stand in Stedy x4 and bed mobility. Sit<>stand in [place] from w/c with max A. Pt required max A for sit>supine in bed and repositioning in bed. Pt conversation tangential and requires max verbal cues to redirect to task. Pt remained in bed with all needs within reach.  Therapy Documentation Precautions:  Restrictions Weight Bearing Restrictions: Yes RLE Weight Bearing: Weight bearing as tolerated   Pain: Pt c/o Rt knee pain; repositioned  Therapy/Group: Individual Therapy  [name] 11/15/2020, 12:04 PM

## 2020-11-15 NOTE — Progress Notes (Signed)
Occupational Therapy Session Note  Patient Details  Name: Colton Bush MRN: [id number] Date of Birth: [birth year]  Today's Date: 11/15/2020 OT Individual Time: 3744-5146 OT Individual Time Calculation (min): 54 min    Short Term Goals: Week 1:  OT Short Term Goal 1 (Week 1): P twill transfer with +1 A and LRAD ot BSC OT Short Term Goal 2 (Week 1): Pt will sit to stand consistently with MOD A in prep for clothing managment OT Short Term Goal 3 (Week 1): Pt will don shirt iwht MIN A OT Short Term Goal 4 (Week 1): Pt will groom seated EOB with S  Skilled Therapeutic Interventions/Progress Updates:    Pt resting in bed upon arrival. Pt stated he was having a bowel movement. OT intervention with focus on bed mobility to facilitate hygiene. Pt with massive type 6 bowel movement requiring +2 assistance for hygiene. Pt with tangential conversation throughout session and seemingly oblivious to amount of feces. Pt made no attempt to assist with cleaning. Foam pad removed from buttocks and RN replaced after hygiene. Total A+2 for rolling in bed. Pt remained in bed with HOB elevated and breakfast on bedside table. Bed alarm activated. All needs within reach.  Therapy Documentation Precautions:  Restrictions Weight Bearing Restrictions: Yes RLE Weight Bearing: Weight bearing as tolerated   Pain: Pt denies pain this morning  Therapy/Group: Individual Therapy  [name] 11/15/2020, 7:56 AM

## 2020-11-15 NOTE — Care Management (Signed)
Inpatient [facility] Statement of Services  Patient Name:  Colton Bush  Date:  11/15/2020  Welcome to the [facility].  Our goal is to provide you with an individualized program based on your diagnosis and situation, designed to meet your specific needs.  With this comprehensive rehabilitation program, you will be expected to participate in at least 3 hours of rehabilitation therapies Monday-Friday, with modified therapy programming on the weekends.  Your rehabilitation program will include the following services:  Physical Therapy (PT), Occupational Therapy (OT), Speech Therapy (ST), 24 hour per day rehabilitation nursing, Therapeutic Recreaction (TR), Psychology, Neuropsychology, Care Coordinator, Rehabilitation Medicine, Nutrition Services, Pharmacy Services and Other  Weekly team conferences will be held on Tuesdays to discuss your progress.  Your Inpatient Rehabilitation Care Coordinator will talk with you frequently to get your input and to update you on team discussions.  Team conferences with you and your family in attendance may also be held.  Expected length of stay: 3-3.5 weeks  Overall anticipated outcome: Contact Guard Assistance to Supervision  Depending on your progress and recovery, your program may change. Your Inpatient Rehabilitation Care Coordinator will coordinate services and will keep you informed of any changes. Your Inpatient Rehabilitation Care Coordinator's name and contact numbers are listed  below.  The following services may also be recommended but are not provided by the [facility] will be made to provide these services after discharge if needed.  Arrangements include referral to agencies that provide these services.  Your insurance has been verified to be:   Medicare A/B  Your primary doctor is:  Colton Bush  Pertinent information will be shared with your doctor and your insurance company.  Inpatient Rehabilitation Care Coordinator:  Cathleen Corti [phone] or ([phone]  Information discussed with and copy given to patient by: Rana Snare, 11/15/2020, 9:57 AM

## 2020-11-15 NOTE — Plan of Care (Signed)
  Problem: Consults Goal: RH BRAIN INJURY PATIENT EDUCATION Description: Description: See Patient Education module for eduction specifics Outcome: Progressing Goal: Skin Care Protocol Initiated - if Braden Score 18 or less Description: If consults are not indicated, leave blank or document N/A Outcome: Progressing   Problem: RH BOWEL ELIMINATION Goal: RH STG MANAGE BOWEL WITH ASSISTANCE Description: STG Manage Bowel with Supervision Assistance. Outcome: Progressing Goal: RH STG MANAGE BOWEL W/MEDICATION W/ASSISTANCE Description: STG Manage Bowel with Medication with Supervision Assistance. Outcome: Progressing   Problem: RH BLADDER ELIMINATION Goal: RH STG MANAGE BLADDER WITH ASSISTANCE Description: STG Manage Bladder With Supervision Assistance Outcome: Progressing Goal: RH STG MANAGE BLADDER WITH MEDICATION WITH ASSISTANCE Description: STG Manage Bladder With Medication With Supervision Assistance. Outcome: Progressing   Problem: RH SKIN INTEGRITY Goal: RH STG SKIN FREE OF INFECTION/BREAKDOWN Description: Remain free from additional breakdown with supervision assistance while on rehab.  Outcome: Progressing Goal: RH STG MAINTAIN SKIN INTEGRITY WITH ASSISTANCE Description: STG Maintain Skin Integrity With Supervision Assistance. Outcome: Progressing Goal: RH STG ABLE TO PERFORM INCISION/WOUND CARE W/ASSISTANCE Description: STG Able To Perform Incision/Wound Care With Supervision Assistance. Outcome: Progressing   Problem: RH SAFETY Goal: RH STG ADHERE TO SAFETY PRECAUTIONS W/ASSISTANCE/DEVICE Description: STG Adhere to Safety Precautions With Supervision Assistance/Device. Outcome: Progressing Goal: RH STG DECREASED RISK OF FALL WITH ASSISTANCE Description: STG Decreased Risk of Fall With Supervision Assistance. Outcome: Progressing   Problem: RH PAIN MANAGEMENT Goal: RH STG PAIN MANAGED AT OR BELOW PT'S PAIN GOAL Description: <4 on a 0-10 pain scale. Outcome:  Progressing   Problem: RH KNOWLEDGE DEFICIT BRAIN INJURY Goal: RH STG INCREASE KNOWLEDGE OF SELF CARE AFTER BRAIN INJURY Description: Patient will be able to demonstrate knowledge of medication management, pain management, skin/wound care, weight bearing precautions, diabetes management with educational materials and handouts provided by staff, at discharge independently. Outcome: Progressing

## 2020-11-15 NOTE — Telephone Encounter (Signed)
Pt is currently at the [facility]

## 2020-11-15 NOTE — Progress Notes (Signed)
Overall Plan of Care [facility]) Patient Details Name: Colton Bush MRN: [id number] DOB: 14-Jan-1956  Admitting Diagnosis: Subdural hematoma [facility])  Hospital Problems: Principal Problem:   Subdural hematoma ([place]) Active Problems:   Liver abscess   Bladder outlet obstruction   Seizure in setting of subdural hematoma   Toxic encephalopathy     Functional Problem List: Nursing Bladder,Bowel,Endurance,Medication Management,Motor,Pain,Skin Integrity,Safety  PT Balance,Behavior,Endurance,Pain,Skin Integrity,Safety,Nutrition  OT Balance,Cognition,Endurance,Motor,Pain,Perception,Safety,Vision,Skin Integrity  SLP Cognition  TR         Basic ADL's: OT Grooming,Bathing,Dressing,Toileting     Advanced  ADL's: OT       Transfers: PT Bed Mobility,Bed to [employer]  OT Toilet     Locomotion: PT Ambulation,Wheelchair Mobility,Stairs     Additional Impairments: OT    SLP Social Cognition   Problem Solving,Memory,Attention  TR      Anticipated Outcomes Item Anticipated Outcome  Self Feeding S  Swallowing      Basic self-care  S UB; MIN A LB  Toileting  CGA   Bathroom Transfers CGA  Bowel/Bladder  Pt will maintain bowel continence and remain free of CAUTI during hospital stay.  Transfers  CGA with LRAD  Locomotion  CGA with LRAD household  Communication     Cognition  Mod I  Pain  Pt will maintain manageable level of pain during hospital stay.  Safety/Judgment  Pt will maintain safety/judgement awareness during hospital stay.   Therapy Plan: PT Intensity: Minimum of 1-2 x/day ,45 to 90 minutes PT Frequency: 5 out of 7 days PT Duration Estimated Length of Stay: 3-3.5 weeks OT Intensity: Minimum of 1-2 x/day, 45 to 90 minutes OT Frequency: 5 out of 7 days OT Duration/Estimated Length of Stay: 3-3.5 weeks SLP Intensity: Minumum of 1-2 x/day, 30 to 90 minutes SLP Frequency: 3 to 5 out of 7 days SLP Duration/Estimated Length of Stay: 14-16 days   Due to the current  state of emergency, patients may not be receiving their 3-hours of Medicare-mandated therapy.   Team Interventions: Nursing Interventions Patient/Family Education,Pain Management,Medication Management,Bowel Management,Bladder Management,Skin Care/Wound Multimedia programmer Planning  PT interventions Ambulation/gait training,Balance/vestibular training,Cognitive remediation/compensation,Discharge planning,Disease Horticulturist, commercial re-education,Functional mobility training,Patient/family education,Wheelchair propulsion/positioning,UE/LE Coordination activities,UE/LE Strength taining/ROM,Therapeutic Exercise,Therapeutic Activities,Stair training,Splinting/orthotics,Skin care/wound Financial controller support  OT Interventions Balance/vestibular training,Discharge planning,Pain management,Self Care/advanced ADL retraining,Therapeutic Activities,UE/LE Coordination activities,Visual/perceptual remediation/compensation,Therapeutic Exercise,Skin care/wound managment,Patient/family education,Functional mobility training,Disease mangement/prevention,Cognitive remediation/compensation,Community Corporate treasurer re-education,Psychosocial support,Splinting/orthotics,UE/LE Strength taining/ROM,Wheelchair propulsion/positioning  SLP Interventions Cognitive remediation/compensation,Speech/Language facilitation,Patient/family education  TR Interventions    SW/CM Interventions     Barriers to Discharge MD  Medical stability  Nursing Decreased caregiver support,Home environment access/layout,Incontinence,Wound Care,Lack of/limited family support,Weight bearing restrictions,Medication compliance,Behavior    PT Inaccessible home environment,Decreased caregiver support,Home environment access/layout,Medication compliance,Behavior poor activity tolernace, step to  enter, per pt inability to have WC in full home and limited walker in home as well  OT Inaccessible home environment may need physical A at DC unsure if wife an provide  SLP      SW       Team Discharge Planning: Destination: PT-Home ,OT-   , SLP-Home Projected Follow-up: PT-Home health PT, OT-  Home health OT, SLP-Home Health SLP,Outpatient SLP Projected Equipment Needs: PT-To be determined, OT- 3 in 1 bedside comode,Tub/shower bench, SLP-None recommended by SLP Equipment Details: PT-pt reports no DME at home, OT-  Patient/family involved in discharge planning: PT- Patient,  OT-Patient, SLP-   MD ELOS: 2-3 weeks modI Medical Rehab Prognosis:  Excellent Assessment: Colton Bush is a 65 year old  man admitted to CIR with a left frontal craniotomy secondary to LDH/SAH from a fall. Active medical issues include hypokalemia, low back pain/muscle spasms, pancreatic cancer with recurrence, type 2 DM, left hip ORIF. Medications are being adjusted, labs and vitals are being monitored regularly.     See Team Conference Notes for weekly updates to the plan of care

## 2020-11-15 NOTE — Progress Notes (Signed)
Inpatient Rehabilitation Care Coordinator Assessment and Plan Patient Details  Name: Colton Bush MRN: [id number] Date of Birth: 09-Jun-1956  Today's Date: 11/15/2020  Hospital Problems: Principal Problem:   Subdural hematoma ([place]) Active Problems:   Liver abscess   Bladder outlet obstruction   Seizure in setting of subdural hematoma   Toxic encephalopathy  Past Medical History:  Past Medical History:  Diagnosis Date  . Arthritis    left hand  . Bronchitis 1977  . Cancer ([place]) 03/09/2016   pancreatic cancer, sees Dr. Cristino Martes at [facility]   . Depression    takes Cymbalta daily  . Diabetes mellitus type II    sees Dr. Chalmers Cater   . GERD (gastroesophageal reflux disease)    takes Omeprazole daily  . H/O hiatal hernia   . Hyperlipidemia    takes Zocor daily  . Hypertension    takes Amlodipine daily  . Hypoglycemia 06/18/2017  . Neck pain    C4-7 stenosis and herniated disc  . Neuromuscular disorder ([place])    hiatal hernia  . Scoliosis    slight  . Spinal cord injury, C5-C7 ([place])    c4-c7  . Stiffness of hand joint    d/t cervical issues   Past Surgical History:  Past Surgical History:  Procedure Laterality Date  . ANTERIOR CERVICAL DECOMP/DISCECTOMY FUSION  08/18/2011   Procedure: ANTERIOR CERVICAL DECOMPRESSION/DISCECTOMY FUSION 3 LEVELS;  Surgeon: Winfield Cunas, MD;  Location: [facility];  Service: Neurosurgery;  Laterality: N/A;  Anterior Cervical Four-Five/Five-Six/Six-Seven Decompression with Fusion, Plating, and Bonegraft  . BURR HOLE Left 11/09/2020   Procedure: LEFT BURR HOLES FOR EVACUATION OF Subdural Hematoma;  Surgeon: Judith Part, MD;  Location: [facility];  Service: Neurosurgery;  Laterality: Left;  . CARPAL TUNNEL RELEASE  2013   bilateral, per Dr. Christella Noa   . COLONOSCOPY  10-30-14   per Dr. Olevia Perches, clear, repeat in 10 yrs   . egd with esophageal dilation  9-08   per Dr. Olevia Perches  . ERCP N/A 03/01/2016   Procedure: ENDOSCOPIC RETROGRADE  CHOLANGIOPANCREATOGRAPHY (ERCP) with brushings and stent;  Surgeon: Doran Stabler, MD;  Location: [facility];  Service: Endoscopy;  Laterality: N/A;  . ESOPHAGOGASTRODUODENOSCOPY (EGD) WITH PROPOFOL N/A 05/27/2020   Procedure: ESOPHAGOGASTRODUODENOSCOPY (EGD) WITH PROPOFOL;  Surgeon: Milus Banister, MD;  [facility];  Service: Endoscopy;  Laterality: N/A;  . EUS N/A 03/09/2016   Procedure: ESOPHAGEAL ENDOSCOPIC ULTRASOUND (EUS) RADIAL;  Surgeon: Milus Banister, MD;  [facility];  Service: Endoscopy;  Laterality: N/A;  . EUS N/A 05/27/2020   Procedure: UPPER ENDOSCOPIC ULTRASOUND (EUS) RADIAL;  Surgeon: Milus Banister, MD;  [facility];  Service: Endoscopy;  Laterality: N/A;  . FEMUR IM NAIL Right 10/25/2020   Procedure: INTRAMEDULLARY (IM) NAIL FEMORAL;  Surgeon: Rod Can, MD;  [facility];  Service: Orthopedics;  Laterality: Right;  . lymph nodes biopsy    . melanoma rt calf  1999  . PORT-A-CATH REMOVAL N/A 04/03/2019   Procedure: PORT REMOVAL;  Surgeon: Stark Klein, MD;  Location: [facility];  Service: General;  Laterality: N/A;  . PORTACATH PLACEMENT Left 03/22/2016   Procedure: INSERTION PORT-A-CATH;  Surgeon: Stark Klein, MD;  [facility];  Service: General;  Laterality: Left;  . SPINE SURGERY    . TONSILLECTOMY     as a child  . ULNAR TUNNEL RELEASE  2013   right arm, per Dr. Christella Noa   . UPPER GASTROINTESTINAL ENDOSCOPY    .  WHIPPLE PROCEDURE N/A 09/19/2016   Procedure: DIAGNOSTIC LAPAROSCOPY, LAPAROSCOPIC LIVER BIOPSY, RETROPERITONEAL EXPLORATION, INTRAOPERATIVE ULTRASOUND;  Surgeon: Stark Klein, MD;  Location: [facility];  Service: General;  Laterality: N/A;   Social History:  reports that he has never smoked. He has never used smokeless tobacco. He reports that he does not drink alcohol and does not use drugs.  Family / Support Systems Marital Status: Married How Long?: 23 years Patient Roles: Spouse,Parent Spouse/Significant  Other: Delila Spence [phone] phone/[phone] cell) Children: 1 adult son Grant(lives with his mother in [facility]). Reports he has not spoken to his son in 2-3 months, and has not seen him in 2-3 years. States that his son is not aware of his cancer. Other Supports: None reported Anticipated Caregiver: Wife Ability/Limitations of Caregiver: None reported Caregiver Availability: 24/7 Family Dynamics: Pt is a retired Development worker, international aid, and lives with his wife.  Social History Preferred language: English Religion: Baptist Cultural Background: Pt worked at [employer] for 16 years until retirement in 2000. Education: some college Read: Yes Write: Yes Employment Status: Retired Date Retired/Disabled/Unemployed: 2000 (Pt states he stopped working in 2017 due to cancer, and went out on short term to long term disabilty trhough employer.) Age Retired: 44 Public relations account executive Issues: Denies Guardian/Conservator: N/A   Abuse/Neglect Abuse/Neglect Assessment Can Be Completed: Yes Physical Abuse: Denies Verbal Abuse: Denies Sexual Abuse: Denies Exploitation of patient/patient's resources: Denies Self-Neglect: Denies  Emotional Status Pt's affect, behavior and adjustment status: Pt in good spirits at time of visit. Recent Psychosocial Issues: Denies Psychiatric History: Denies Substance Abuse History: Denies  Patient / Family Perceptions, Expectations & Goals Pt/Family understanding of illness & functional limitations: Pt has general understanding of care needs Premorbid pt/family roles/activities: Independent Anticipated changes in roles/activities/participation: Assistance with ADLs/IADLs Pt/family expectations/goals: Pt goal is to "walk even if on a walker and be independent; knees to get better and not hurt as much; walk like normal; drive. I don't want to be cooped up in a house."  [employer]: None Premorbid Home Care/DME Agencies:  None Transportation available at discharge: Wife Resource referrals recommended: Neuropsychology  Discharge Planning Living Arrangements: Spouse/significant other Support Systems: Spouse/significant other Type of Residence: Private residence Insurance Resources: [employer] (specify) Scientist, clinical (histocompatibility and immunogenetics) Supplement) Financial Resources: [place] Referred: No Living Expenses: Medical laboratory scientific officer Management: Patient,Spouse Does the patient have any problems obtaining your medications?: No Home Management: Pt and wife both managed home care needs Patient/Family Preliminary Plans: Wife to manage Care Coordinator Barriers to Discharge: Decreased caregiver support,Lack of/limited family support Care Coordinator Anticipated Follow Up Needs: HH/OP Expected length of stay: 3-3.5 weeks  Clinical Impression SW met with pt in room to introduce self, explain role, and discuss discharge process. Pt is not a English as a second language teacher. Pt states his PCP is Alysia Penna at [facility]. DME: a few canes that belonged to his late mother. Pt aware SW to follow-up with his wife.   SW left message for pt wife [name] [phone]) to introduce self, explain role, inform on ELOS 3-3.5 weeks, and SW will follow-up tomorrow after team conference to provide updates.   Rana Snare 11/15/2020, 3:49 PM

## 2020-11-16 ENCOUNTER — Inpatient Hospital Stay (HOSPITAL_COMMUNITY): Payer: Medicare Other

## 2020-11-16 ENCOUNTER — Ambulatory Visit: Payer: Medicare Other | Admitting: Radiation Oncology

## 2020-11-16 LAB — GLUCOSE, CAPILLARY
Glucose-Capillary: 88 mg/dL (ref 70–99)
Glucose-Capillary: 217 mg/dL — ABNORMAL HIGH (ref 70–99)
Glucose-Capillary: 228 mg/dL — ABNORMAL HIGH (ref 70–99)
Glucose-Capillary: 253 mg/dL — ABNORMAL HIGH (ref 70–99)

## 2020-11-16 LAB — BASIC METABOLIC PANEL
Anion gap: 6 (ref 5–15)
BUN: 7 mg/dL — ABNORMAL LOW (ref 8–23)
CO2: 28 mmol/L (ref 22–32)
Calcium: 7.9 mg/dL — ABNORMAL LOW (ref 8.9–10.3)
Chloride: 103 mmol/L (ref 98–111)
Creatinine, Ser: 0.42 mg/dL — ABNORMAL LOW (ref 0.61–1.24)
GFR, Estimated: >60 mL/min (ref >60)
Glucose, Bld: 97 mg/dL (ref 70–99)
Potassium: 3.1 mmol/L — ABNORMAL LOW (ref 3.5–5.1)
Sodium: 137 mmol/L (ref 135–145)

## 2020-11-16 MED ORDER — HYDROCODONE-ACETAMINOPHEN 7.5-325 MG PO TABS
1.0000 | ORAL_TABLET | Freq: Two times a day (BID) | ORAL | Status: DC
  Administered 2020-11-17 – 2020-11-18 (×3): 1 via ORAL
  Filled 2020-11-16 (×3): qty 1

## 2020-11-16 MED ORDER — DICLOFENAC SODIUM 1 % EX GEL
2.0000 g | Freq: Four times a day (QID) | CUTANEOUS | Status: DC
  Administered 2020-11-17 – 2020-12-08 (×80): 2 g via TOPICAL
  Filled 2020-11-16 (×2): qty 100

## 2020-11-16 MED ORDER — OXYCODONE HCL 5 MG PO TABS: 10.0000 mg | ORAL_TABLET | Freq: Two times a day (BID) | ORAL | Status: DC

## 2020-11-16 MED ORDER — POTASSIUM CHLORIDE 20 MEQ PO PACK
40.0000 meq | PACK | Freq: Two times a day (BID) | ORAL | Status: DC
  Administered 2020-11-16 – 2020-12-06 (×41): 40 meq via ORAL
  Filled 2020-11-16 (×41): qty 2

## 2020-11-16 NOTE — Progress Notes (Signed)
PHARMACY CONSULT NOTE FOR:  OUTPATIENT  PARENTERAL ANTIBIOTIC THERAPY (OPAT)  Indication: liver abscess and possible infected subdural hematoma Regimen: CTX 2gm IV Q12H and Flagyl 500mg  PO TID End date: 12/21/20  IV antibiotic discharge orders are pended. To discharging provider:  please sign these orders via discharge navigator,  Select New Orders & click on the button choice - Manage This Unsigned Work.     Jimmy Footman, PharmD, BCPS, BCIDP Infectious Diseases Clinical Pharmacist Phone: [phone] 11/16/2020, 9:14 AM

## 2020-11-16 NOTE — Progress Notes (Signed)
PROGRESS NOTE   Subjective/Complaints: No complaints this morning Was complaining of low back pain in therapy session. Alk phos elevated. Discussed with patient getting lumbar XR to eval.  K+ quite low despite supplementation- schedule supplementation and repeat K+ tomorrow.   ROS: +low back pain  Objective:   No results found. Recent Labs    11/15/20 0519  WBC 7.9  HGB 9.9*  HCT 30.7*  PLT 470*   Recent Labs    11/15/20 0519 11/16/20 0525  NA 139 137  K 3.2* 3.1*  CL 105 103  CO2 28 28  GLUCOSE 215* 97  BUN 8 7*  CREATININE 0.48* 0.42*  CALCIUM 8.1* 7.9*    Intake/Output Summary (Last 24 hours) at 11/16/2020 1353 Last data filed at 11/16/2020 0730 Gross per 24 hour  Intake 598 ml  Output 660 ml  Net -62 ml     Pressure Injury 11/07/20 Heel Right Stage 1 -  Intact skin with non-blanchable redness of a localized area usually over a bony prominence. (Active)  11/07/20 0800  Location: Heel  Location Orientation: Right  Staging: Stage 1 -  Intact skin with non-blanchable redness of a localized area usually over a bony prominence.  Wound Description (Comments):   Present on Admission:      Pressure Injury 11/07/20 Sacrum Stage 2 -  Partial thickness loss of dermis presenting as a shallow open injury with a red, pink wound bed without slough. 4/15 skin is now broken; was documented as stage 1. now a stage 2. (Active)  11/07/20 2000  Location: Sacrum  Location Orientation:   Staging: Stage 2 -  Partial thickness loss of dermis presenting as a shallow open injury with a red, pink wound bed without slough.  Wound Description (Comments): 4/15 skin is now broken; was documented as stage 1. now a stage 2.  Present on Admission:      Pressure Injury 11/07/20 Heel Right Deep Tissue Pressure Injury - Purple or maroon localized area of discolored intact skin or blood-filled blister due to damage of underlying soft  tissue from pressure and/or shear. (Active)  11/07/20 2000  Location: Heel  Location Orientation: Right  Staging: Deep Tissue Pressure Injury - Purple or maroon localized area of discolored intact skin or blood-filled blister due to damage of underlying soft tissue from pressure and/or shear.  Wound Description (Comments):   Present on Admission:     Physical Exam: Vital Signs Blood pressure (!) 145/70, pulse 80, temperature 98.1 F (36.7 C), resp. rate 16, height 5' 11"  (1.803 m), weight 83 kg, SpO2 95 %. Gen: no distress, normal appearing HEENT: oral mucosa pink and moist, NCAT Cardio: Reg rate Chest: normal effort, normal rate of breathing Abdominal:  General: There is no distension.  Comments: Well healed old midline incision. RUQ drains in place--one with bilious drainage and other with blood tinged drainage.  Soft, slightly TTP diffusely- no rebound; hypoactive BS Genitourinary: Penis: Normal.  Comments: Light amber urine in foley as it was placed under sterile conditions Musculoskeletal:  Cervical back: Normal range of motionand neck supple.  Comments: RLE with multiple small stapled incisions.   UE's 5-/5 but equal B/L  LE's -  4+/5 in HF, KE, DF and PF B/L  Skin: General: Skin is warmand dry.  Comments: RLE incisions as well as abd incisions already noted L forearm/wrist IVs' look OK, stage 2 to sacrum Neurological:  Mental Status: He is oriented to person, place, and time.  Comments: Soft voice with mild left facial weakness. Slow to respond but able to follow basic motor commands.   Slightly perseverative on MS exam, neuro exam Slow to speak- word searching, but used appropriate words Intact to light touch in all 4 extremities Psychiatric:  Comments: flat   Assessment/Plan: 1. Functional deficits which require 3+ hours per day of interdisciplinary therapy in a comprehensive inpatient rehab setting.  Physiatrist is  providing close team supervision and 24 hour management of active medical problems listed below.  Physiatrist and rehab team continue to assess barriers to discharge/monitor patient progress toward functional and medical goals  Care Tool:  Bathing    Body parts bathed by patient: Right arm,Left arm,Chest,Abdomen,Face   Body parts bathed by helper: Front perineal area,Buttocks,Right upper leg,Left upper leg,Right lower leg,Left lower leg     Bathing assist Assist Level: Maximal Assistance - Patient 24 - 49%     Upper Body Dressing/Undressing Upper body dressing   What is the patient wearing?: Pull over shirt    Upper body assist Assist Level: Moderate Assistance - Patient 50 - 74%    Lower Body Dressing/Undressing Lower body dressing      What is the patient wearing?: Pants     Lower body assist Assist for lower body dressing: Moderate Assistance - Patient 50 - 74%     Toileting Toileting    Toileting assist Assist for toileting: Dependent - Patient 0%     Transfers Chair/bed transfer  Transfers assist     Chair/bed transfer assist level: Dependent - mechanical lift (stedy)     Locomotion Ambulation   Ambulation assist   Ambulation activity did not occur: Safety/medical concerns          Walk 10 feet activity   Assist  Walk 10 feet activity did not occur: Safety/medical concerns        Walk 50 feet activity   Assist Walk 50 feet with 2 turns activity did not occur: Safety/medical concerns         Walk 150 feet activity   Assist Walk 150 feet activity did not occur: Safety/medical concerns         Walk 10 feet on uneven surface  activity   Assist Walk 10 feet on uneven surfaces activity did not occur: Safety/medical concerns         Wheelchair     Assist Will patient use wheelchair at discharge?: Yes Type of Wheelchair: Manual Wheelchair activity did not occur: Safety/medical concerns  Wheelchair assist level:  Minimal Assistance - Patient > 75% Max wheelchair distance: 11'    Wheelchair 50 feet with 2 turns activity    Assist    Wheelchair 50 feet with 2 turns activity did not occur: Safety/medical concerns   Assist Level: Minimal Assistance - Patient > 75%   Wheelchair 150 feet activity     Assist  Wheelchair 150 feet activity did not occur: Safety/medical concerns       Blood pressure (!) 145/70, pulse 80, temperature 98.1 F (36.7 C), resp. rate 16, height 5' 11"  (1.803 m), weight 83 kg, SpO2 95 %.  Medical Problem List and Plan: 1.L frontal craniotomysecondary to LDH/SAH from fall -patient may Shower if incisions covered -  ELOS/Goals: 2-3 weeks- mod I to supervision             Continue CIR 2. Impaired mobility: -DVT/anticoagulation: Pharmaceutical: Continue Lovenox -antiplatelet therapy: N/a 3. Low back pain: tylenol prn, lumbar XR ordered given low back pain in cancer patient in setting of elevated alk phos- assess for metastasis 4. Mood: LCSW to follow for evaluation and support.  -antipsychotic agents: N/A 5. Neuropsych: This patient is capable of making decisions on his own behalf. 6. Multiple deep tissue injuries/Skin/Wound Care: Continue Air mattress overlay with pressure relief measures.  --Continue vitamins and collagen to promote wound healing  7. Fluids/Electrolytes/Nutrition: Monitor I/O. Check lytes in am. 8. SDH (concerns of bacterial infection): S/p Burr hole evacuation  9. Strep anginosis bacteremia: Continue Ceftriaxone/Metronidazole X 6 weeks w/end date  10 Liver abscess s/p JP drain placement: Continue flushes TID and document output. . 11. New onset seizures: Continue Keppra bid till follow up with neuro on outpatient basis.  12. Left hip ORIF: WBAT 13. T2DM: Hgb A1C- 10.8. Will monitor BS ac/hs. Home regimen Levemir 10u am/15u pm with SSI.  --continue Levemir bid with  SSI and titrate insulin as indicated.           CBG 193-273: increase levemir to 13U 14. BPH/Urinary retention: Foley replaced--keep in place for decompression for few days than start bladder program.  --will increase flomax to 0.8 mg/HS  15. Pancreatic cancer with recurrence: Continue Creon with meals. --follow up with Hem/onc and Radiation Onc after discharge. 16. Low back pain/muscle spasms- will start Robaxin prn and lidoderm patches 1- 8pm to 8am.              4/16: denies pain: decrease robaxin to q12H prn.  17. Hypokalemia: schedule supplement and repeat BMP tomorrow 18. Overweight BMI 25.52: provide counseling.  19. Elevated alk phos: repeat tomorrow to trend.  >35 minutes spent in encouragement of patient to call his son and make amends, listening to patient about their history, left message for his wife to answer her questions, review of patient's labs, discussion of his progress with care team, ordering lumbar XR to assess for spinal metastases.     LOS: 4 days A FACE TO FACE EVALUATION WAS PERFORMED  Clide Deutscher Taj Nevins 11/16/2020, 1:53 PM

## 2020-11-16 NOTE — Progress Notes (Signed)
Physical Therapy Session Note  Patient Details  Name: Colton Bush MRN: [id number] Date of Birth: 05/24/1956  Today's Date: 11/16/2020 PT Individual Time: 0845-1000 PT Individual Time Calculation (min): 75 min   Short Term Goals: Week 1:  PT Short Term Goal 1 (Week 1): pt to demonstrate supine<>sit min A PT Short Term Goal 2 (Week 1): pt to demonstrate bed<>chair transfers mod A with LRAD PT Short Term Goal 3 (Week 1): pt to demonstrate WC mobility 75 min A PT Short Term Goal 4 (Week 1): to initiate gait  Skilled Therapeutic Interventions/Progress Updates:    Pt received seated in bed, agreeable to PT session. Pt reports minimal pain in low back and R knee at rest, declines intervention. Supine to sit with min A for some trunk elevation with increased time needed, heavy reliance on hospital bed features. Pt reports increase in pain with mobility, agreeable to request pain medication from nursing. Pt able to receive Tylenol from nursing during session. Pt declines any other intervention for pain this session despite pain being limiting factor for ability to participate this date. Sit to stand with max A +2 from elevated bed to stedy. Stedy transfer to w/c. Sit to stand in // bars with max A. Attempt to perform alt LE lifts in standing in // bars, pt unable to tolerate WBing on RLE in stance, LE buckles even with knee blocked. Pt able to perform sit to stand x 3 in // bars with max A, improved tolerance for standing this date with increased time able to stand before rest break needed. Introduced squat pivot transfers. Squat pivot transfer w/c to/from mat table with max A x 1 and min A x 1. Pt with fair ability to follow cues to sequence transfer and maintain correct head/hips relationship during transfer. Total A x 2 to stand from w/c to stedy. Stedy transfer back to bed. Sit to supine mod A needed for BLE management. Reviewed RLE stretches and strengthening exercises patient can perform more  independently at bed level: AAROM heel slides with gait belt, quad set x 10 reps each. Pt declines ice pack for R knee pain at this time. Pt left supine in bed with needs in reach at end of session.  Therapy Documentation Precautions:  Restrictions Weight Bearing Restrictions: No RLE Weight Bearing: Weight bearing as tolerated   Therapy/Group: Individual Therapy   Excell Seltzer, PT, DPT, CSRS  11/16/2020, 3:35 PM

## 2020-11-16 NOTE — Patient Care Conference (Signed)
Inpatient RehabilitationTeam Conference and Plan of Care Update Date: 11/16/2020   Time: 11:00 AM    Patient Name: Colton Bush      Medical Record Number: [id number]  Date of Birth: [birth year] Sex: Male         Room/Bed: 4M12C/4M12C-01 Payor Info: Payor: MEDICARE / Plan: MEDICARE PART A AND B / Product Type: *No Product type* /    Admit Date/Time:  11/12/2020  5:52 PM  Primary Diagnosis:  Subdural hematoma [facility])  Hospital Problems: Principal Problem:   Subdural hematoma (HCC) Active Problems:   Liver abscess   Bladder outlet obstruction   Seizure in setting of subdural hematoma   Toxic encephalopathy    Expected Discharge Date: Expected Discharge Date: 12/03/20  Team Members Present: Physician leading conference: Dr. Leeroy Cha Care Coodinator Present: Loralee Pacas, LCSWA;Bani Gianfrancesco Creig Hines, RN, BSN, [place] Nurse Present: Dorthula Nettles, RN PT Present: Excell Seltzer, PT OT Present: Willeen Cass, OT;Roanna Epley, COTA SLP Present: Charolett Bumpers, SLP PPS Coordinator present : Gunnar Fusi, SLP     Current Status/Progress Goal Weekly Team Focus  Bowel/Bladder   pt is incontinent of bowel and has a foley catheter  to have more continent BMs and to eventually discontinue the foley  timed tolieting q3h   Swallow/Nutrition/ Hydration             ADL's   sit<>stand in Stedy-max A; transfer with Stedy; LB ADLs-dependent/max A; UB ADLs-mod A  CGA/supervision overall  OOB tolerance, BADL retraining, functional transfers, safety awareness,   Mobility   max A bed mobility, max A to stand to stedy, transfer via stedy, min A w/c mobility x 50 ft  CGA overall with RW, ambulatory  sit to stand, transfers, LE strengthening   Communication             Safety/Cognition/ Behavioral Observations  max-mod A  Min- Supervision A  mildly complex problem solving, recall, selecitive attention and overall awareness   Pain   no pain  free of pain  assess pain qshift and prn    Skin   Hip incisions x3 with staples 2 of those are covered with a foam dressing; both heels are covered with foam dressing L is red and boggy blanchable R is red and blanchable; x2 JP drains site is red;  to keep heels free from breakdown using prevalon boots as well as foam dressing; keep jp drain site free from infection;  assess skin qshift and prn     Discharge Planning:  D/c to home with 24/7 care from his wife.   Team Discussion: IV antibiotics scheduled to end on 12/21/20, CBG's trending up, adjusting Levemir. Nursing reports incontinent of bowel, foley in place. No reports of pain, no sleep issues, staples to the incision, 2 JP drains. Educating on Levemir injections, wound care, and dressing changes, and medication management. Social work reports he is going home with his wife. Patient on target to meet rehab goals: yes, max assist for bed mobility, max assist +2 for standing, limited by pain and endurance. Incontinent of bowel for OT and will report to OT, not sure why he won't let nursing know. He has contact guard to supervision goals. SLP reports he is min assist to supervision and they are working on selective attention and awareness.  *See Care Plan and progress notes for long and short-term goals.   Revisions to Treatment Plan:  Started K+ for hypokalemia.  Teaching Needs: Family education, medication management, pain management, skin/wound care, transfer training,  gait training, balance training, endurance training, diabetic teaching, levemir teaching, insulin injection teaching, safety awareness, weight bearing precautions foley education.  Current Barriers to Discharge: Decreased caregiver support, Medical stability, Home enviroment access/layout, Incontinence, Wound care, Lack of/limited family support, Weight, Weight bearing restrictions, Medication compliance and Behavior  Possible Resolutions to Barriers: Continue current medications, provide emotional support.      Medical Summary Current Status: receiving IV antibiotics, hypokalmeia, elevated CBGs, multiple pressure injuries  Barriers to Discharge: Medical stability;Wound care  Barriers to Discharge Comments: receiving IV antibiotics, hypokalmeia, elevated CBGs, multiple pressure injuries Possible Resolutions to [employer] Focus: continue IV antibitotics, started daily K+ supplement, frequent BMP checks, increase levemir, levemir treaching, continue monitoring incisions daily   Continued Need for Acute Rehabilitation Level of Care: The patient requires daily medical management by a physician with specialized training in physical medicine and rehabilitation for the following reasons: Direction of a multidisciplinary physical rehabilitation program to maximize functional independence : Yes Medical management of patient stability for increased activity during participation in an intensive rehabilitation regime.: Yes Analysis of laboratory values and/or radiology reports with any subsequent need for medication adjustment and/or medical intervention. : Yes   I attest that I was present, lead the team conference, and concur with the assessment and plan of the team.   Cristi Loron 11/16/2020, 4:46 PM

## 2020-11-16 NOTE — Progress Notes (Signed)
Patient ID: Colton Bush, male   DOB: July 19, 1956, 65 y.o.   MRN: [id number]  SW met with pt and pt husband in room to provide updates from team conference, and d/c date 5/6. Pt wife stressed pt will need to physically be able to get up on his own as she cannot lift him. SW discussed alternative options such as home with [facility] and private sitter, or short term rehab-SNF. Pt is striving to not return to SNF again. SW provided handicap placard. SW will continue to provide updates after team conference.   Loralee Pacas, MSW, [place] Office: [phone] Cell: [phone] Fax: [phone]

## 2020-11-16 NOTE — Progress Notes (Signed)
Occupational Therapy Session Note  Patient Details  Name: Colton Bush MRN: [id number] Date of Birth: 02-21-1956  Today's Date: 11/16/2020 OT Individual Time: 0700-0755 OT Individual Time Calculation (min): 55 min    Short Term Goals: Week 1:  OT Short Term Goal 1 (Week 1): P twill transfer with +1 A and LRAD ot BSC OT Short Term Goal 2 (Week 1): Pt will sit to stand consistently with MOD A in prep for clothing managment OT Short Term Goal 3 (Week 1): Pt will don shirt iwht MIN A OT Short Term Goal 4 (Week 1): Pt will groom seated EOB with S  Skilled Therapeutic Interventions/Progress Updates:    Pt resting in bed upon arrival. Pt reported that he just had a bowel movement. Pt with large type 6 bowel movement that soiled clothing. Focus of session on bed mobility to facilitate hygiene and donning clean brief/pants at bed level. Rolling R/L with max A using bed rails. Hygiene at bed level with +2 assistance for thoroughness. Pt did not offer assistance and engaged in unrelated conversation throughout session. Pain as noted below although pt stated the new bed was more comfortable for his buttocks. Pt remained in bed with all needs within reach.    Therapy Documentation Precautions:  Restrictions Weight Bearing Restrictions: No RLE Weight Bearing: Weight bearing as tolerated  Pain:  Pt states his Rt knee is feeling better but his Lt knee is hurting this morning; repositioned   Therapy/Group: Individual Therapy  [name] 11/16/2020, 8:01 AM

## 2020-11-16 NOTE — Progress Notes (Signed)
Speech Language Pathology Daily Session Note  Patient Details  Name: Colton Bush MRN: [id number] Date of Birth: 1956/06/29  Today's Date: 11/16/2020 SLP Individual Time: 0802-2336 SLP Individual Time Calculation (min): 44 min  Short Term Goals: Week 1: SLP Short Term Goal 1 (Week 1): Pt will demonstrate anticipatory awareness for home with mod A verbal cues. SLP Short Term Goal 2 (Week 1): Pt will recall 2 events from previous therapy sessions with min A verbal cues. SLP Short Term Goal 3 (Week 1): Pt will selectively attend to functional tasks with Min A verbal cues. SLP Short Term Goal 4 (Week 1): Pt will complete mildly complex problem solving tasks with min A verbal cues.  Skilled Therapeutic Interventions:Skilled ST services focused on cognitive skills. Pt demonstrated tangential speech, preservating on need for cane verse walker. SLP provided education pertaining each therapist role and need to communicate with PT, however pt continued topic. Pt required direct cue to move to new topic. SLP facilitated recall and verbal problem solving skills with current medication list. Pt was able to recall medication name/function/times per day, mod I for use of medication list. Pt required min A verbal cues for verbal problem solving medication consumed multiple times per day. Pt's wife entered room and supported pt is near cognitive baseline, but noted intermittent confusion and was responsible for higher level task such as medication/money/time management. Pt's wife supports tangential speech is a baseline personality trait verse novel deficit. Pt required education for anticipatory awareness and problem solving, pertaining to missing OT Am session due to regular BM. SLP provided education to notify NT as soon as the urge occurs and/or after the BM has happened to allow time for more therapeutic tasks during AM OT session. Pt agreed. SLP will complete TID pill organizer in next session due to time  limitations. Pt was left in room with wife, call bell within reach and bed alarm set. SLP recommends to continue skilled services.     Pain Pain Assessment Pain Scale: 0-10 Pain Score: 0-No pain  Therapy/Group: Individual Therapy  Remas Sobel  [facility] 11/16/2020, 11:08 AM

## 2020-11-16 NOTE — Progress Notes (Signed)
Patient reporting significant right hip and knee pain with activity--will try low dose vicodin  prior to am and pm therapy. Voltaren gel added to right knee to help with OA. Will check follow up Xrays on right hip as almost a month out from surgery and still having considerable pain with weight bearing.

## 2020-11-17 ENCOUNTER — Ambulatory Visit: Payer: Medicare Other | Admitting: Radiation Oncology

## 2020-11-17 LAB — HEPATIC FUNCTION PANEL
ALT: 21 U/L (ref 0–44)
AST: 17 U/L (ref 15–41)
Albumin: 1.8 g/dL — ABNORMAL LOW (ref 3.5–5.0)
Alkaline Phosphatase: 788 U/L — ABNORMAL HIGH (ref 38–126)
Bilirubin, Direct: <0.1 mg/dL (ref 0.0–0.2)
Total Bilirubin: 0.4 mg/dL (ref 0.3–1.2)
Total Protein: 5.8 g/dL — ABNORMAL LOW (ref 6.5–8.1)

## 2020-11-17 LAB — GLUCOSE, CAPILLARY
Glucose-Capillary: 98 mg/dL (ref 70–99)
Glucose-Capillary: 285 mg/dL — ABNORMAL HIGH (ref 70–99)
Glucose-Capillary: 156 mg/dL — ABNORMAL HIGH (ref 70–99)
Glucose-Capillary: 194 mg/dL — ABNORMAL HIGH (ref 70–99)

## 2020-11-17 LAB — BASIC METABOLIC PANEL
Anion gap: 5 (ref 5–15)
BUN: 6 mg/dL — ABNORMAL LOW (ref 8–23)
CO2: 28 mmol/L (ref 22–32)
Calcium: 8.1 mg/dL — ABNORMAL LOW (ref 8.9–10.3)
Chloride: 102 mmol/L (ref 98–111)
Creatinine, Ser: 0.44 mg/dL — ABNORMAL LOW (ref 0.61–1.24)
GFR, Estimated: >60 mL/min (ref >60)
Glucose, Bld: 107 mg/dL — ABNORMAL HIGH (ref 70–99)
Potassium: 3.6 mmol/L (ref 3.5–5.1)
Sodium: 135 mmol/L (ref 135–145)

## 2020-11-17 LAB — C-REACTIVE PROTEIN: CRP: 5.3 mg/dL — ABNORMAL HIGH (ref <1.0)

## 2020-11-17 LAB — SEDIMENTATION RATE: Sed Rate: 87 mm/hr — ABNORMAL HIGH (ref 0–16)

## 2020-11-17 MED ORDER — CALCIUM POLYCARBOPHIL 625 MG PO TABS
625.0000 mg | ORAL_TABLET | Freq: Two times a day (BID) | ORAL | Status: DC
  Administered 2020-11-17 – 2020-12-08 (×42): 625 mg via ORAL
  Filled 2020-11-17 (×42): qty 1

## 2020-11-17 NOTE — Progress Notes (Signed)
Physical Therapy Session Note  Patient Details  Name: Colton Bush MRN: [id number] Date of Birth: [birth year]  Today's Date: 11/17/2020 PT Individual Time: 0900-1010; 1430-1455 PT Individual Time Calculation (min): 70 min and 25 min  Short Term Goals: Week 1:  PT Short Term Goal 1 (Week 1): pt to demonstrate supine<>sit min A PT Short Term Goal 2 (Week 1): pt to demonstrate bed<>chair transfers mod A with LRAD PT Short Term Goal 3 (Week 1): pt to demonstrate WC mobility 75 min A PT Short Term Goal 4 (Week 1): to initiate gait  Skilled Therapeutic Interventions/Progress Updates:    Session 1: Pt received supine in bed, agreeable to PT session. Pt reports no complaints of pain at rest and reports being premedicated prior to start of therapy session. Pt has onset of low back and R knee pain with mobility that improves at rest. Pt's wife [name] present at beginning of therapy session. Pt asking if he can use B quad canes "hurricanes" instead of RW for his mobility. Education with patient regarding his current functional status and that he needs to progress with his ability to stand to a RW vs using the stedy before he can consider a cane as an AD. Rolling L/R with mod A in bed to pull pants up over hips. Supine to sit with min A for some trunk elevation, HOB elevated and use of bedrails. Sit to stand with max A to stedy from elevated bed. Stedy transfer to w/c. Sit to stand in // bars x 4 reps with max A and B knees blocked. Focus on hip and trunk extension for upright posture in standing. Pt able to perform lateral weight shifts in standing and is able to lift RLE, unable to tolerate enough weight on RLE in order to lift LLE at this time. Pt fatigues quickly in standing. Seated exercises on Kinetron: level 80 cm/sec, 4 x 1 min. Attempted to have pt perform w/c mobility with use of BUE, able to propel x 15 ft with min A before onset of fatigue. Pt reports increase in R knee pain by end of session,  declines intervention. Provided R ELR for improved comfort and postioning in w/c. Pt left seated in w/c in room with needs in reach at end of session.  Session 2: Pt received seated in bed, agreeable to PT session. No complaints of pain. Pt agreeable to supine RLE therex this session: heel slides with use of gait belt for AAROM, hip abd with AAROM, SAQ 2 x 10 reps each. Encouraged pt to perform RLE therex at bed level between sessions to continue to improve strength and ROM. Pt left seated in bed with needs in reach, wife present.  Therapy Documentation Precautions:  Restrictions Weight Bearing Restrictions: No RLE Weight Bearing: Weight bearing as tolerated    Therapy/Group: Individual Therapy   Excell Seltzer, PT, DPT, CSRS  11/17/2020, 12:24 PM

## 2020-11-17 NOTE — Progress Notes (Signed)
PROGRESS NOTE   Subjective/Complaints: Discussed results of hip/pelvis and lumbar spine XRs with patient and wife. Discussed elevated alk phos levels- will inform oncology. Patient's wife asks when chemo can be restarted- will ask oncology to discuss with patient.   ROS: denies low back pain, +right hip and knee pain with activity.   Objective:   DG Lumbar Spine 2-3 Views  Result Date: 11/16/2020 CLINICAL DATA:  Pain right flank EXAM: LUMBAR SPINE - 2-3 VIEW COMPARISON:  CT abdomen pelvis 08/31/2020 FINDINGS: Mild retrolisthesis L2-3 and L3-4. Diffuse disc degeneration with disc space narrowing and spurring throughout the lumbar spine. Mild lumbar kyphosis Negative for fracture or mass Pigtail drainage catheter right upper quadrant. Bowel staples in the region of the stomach. IMPRESSION: Multilevel lumbar degenerative change.  No acute abnormality. Electronically Signed   By: Franchot Gallo M.D.   On: 11/16/2020 16:45   DG HIP UNILAT WITH PELVIS 2-3 VIEWS RIGHT  Result Date: 11/16/2020 CLINICAL DATA:  Follow-up hip fracture EXAM: DG HIP (WITH OR WITHOUT PELVIS) 3V RIGHT COMPARISON:  10/25/2020 FINDINGS: Medullary rod and proximal fixation screw are again seen and stable. Changes of prior proximal right femoral fracture are seen with fracture fragments in near anatomic alignment. No new focal abnormality is noted. IMPRESSION: Status post ORIF of proximal right femoral fracture. Electronically Signed   By: Inez Catalina M.D.   On: 11/16/2020 21:16   Recent Labs    11/15/20 0519  WBC 7.9  HGB 9.9*  HCT 30.7*  PLT 470*   Recent Labs    11/16/20 0525 11/17/20 0524  NA 137 135  K 3.1* 3.6  CL 103 102  CO2 28 28  GLUCOSE 97 107*  BUN 7* 6*  CREATININE 0.42* 0.44*  CALCIUM 7.9* 8.1*    Intake/Output Summary (Last 24 hours) at 11/17/2020 9396 Last data filed at 11/17/2020 0700 Gross per 24 hour  Intake 460 ml  Output 2385 ml   Net -1925 ml     Pressure Injury 11/07/20 Heel Right Stage 1 -  Intact skin with non-blanchable redness of a localized area usually over a bony prominence. (Active)  11/07/20 0800  Location: Heel  Location Orientation: Right  Staging: Stage 1 -  Intact skin with non-blanchable redness of a localized area usually over a bony prominence.  Wound Description (Comments):   Present on Admission:      Pressure Injury 11/07/20 Sacrum Stage 2 -  Partial thickness loss of dermis presenting as a shallow open injury with a red, pink wound bed without slough. 4/15 skin is now broken; was documented as stage 1. now a stage 2. (Active)  11/07/20 2000  Location: Sacrum  Location Orientation:   Staging: Stage 2 -  Partial thickness loss of dermis presenting as a shallow open injury with a red, pink wound bed without slough.  Wound Description (Comments): 4/15 skin is now broken; was documented as stage 1. now a stage 2.  Present on Admission:      Pressure Injury 11/07/20 Heel Right Deep Tissue Pressure Injury - Purple or maroon localized area of discolored intact skin or blood-filled blister due to damage of underlying soft tissue from pressure and/or  shear. (Active)  11/07/20 2000  Location: Heel  Location Orientation: Right  Staging: Deep Tissue Pressure Injury - Purple or maroon localized area of discolored intact skin or blood-filled blister due to damage of underlying soft tissue from pressure and/or shear.  Wound Description (Comments):   Present on Admission:     Physical Exam: Vital Signs Blood pressure 126/71, pulse 78, temperature (!) 97.5 F (36.4 C), resp. rate 20, height $RemoveBe'5\' 11"'ZrotMoCGg$  (1.803 m), weight 83 kg, SpO2 93 %.  Gen: no distress, normal appearing HEENT: oral mucosa pink and moist, NCAT Cardio: Reg rate Chest: normal effort, normal rate of breathing Abdominal:  General: There is no distension.  Comments: Well healed old midline incision. RUQ drains in place--one with  bilious drainage and other with blood tinged drainage.  Soft, slightly TTP diffusely- no rebound; hypoactive BS Genitourinary: Penis: Normal.  Comments: Light amber urine in foley as it was placed under sterile conditions Musculoskeletal:  Cervical back: Normal range of motionand neck supple.  Comments: RLE with multiple small stapled incisions.   UE's 5-/5 but equal B/L  LE's - 4+/5 in HF, KE, DF and PF B/L  Skin: General: Skin is warmand dry.  Comments: RLE incisions as well as abd incisions already noted L forearm/wrist IVs' look OK, stage 2 to sacrum Neurological:  Mental Status: He is oriented to person, place, and time.  Comments: Soft voice with mild left facial weakness. Slow to respond but able to follow basic motor commands.   Slightly perseverative on MS exam, neuro exam Slow to speak- word searching, but used appropriate words Intact to light touch in all 4 extremities Psychiatric:  Comments: flat   Assessment/Plan: 1. Functional deficits which require 3+ hours per day of interdisciplinary therapy in a comprehensive inpatient rehab setting.  Physiatrist is providing close team supervision and 24 hour management of active medical problems listed below.  Physiatrist and rehab team continue to assess barriers to discharge/monitor patient progress toward functional and medical goals  Care Tool:  Bathing    Body parts bathed by patient: Right arm,Left arm,Chest,Abdomen,Face   Body parts bathed by helper: Front perineal area,Buttocks,Right upper leg,Left upper leg,Right lower leg,Left lower leg     Bathing assist Assist Level: Maximal Assistance - Patient 24 - 49%     Upper Body Dressing/Undressing Upper body dressing   What is the patient wearing?: Pull over shirt    Upper body assist Assist Level: Moderate Assistance - Patient 50 - 74%    Lower Body Dressing/Undressing Lower body dressing      What is the patient  wearing?: Pants     Lower body assist Assist for lower body dressing: Moderate Assistance - Patient 50 - 74%     Toileting Toileting    Toileting assist Assist for toileting: Dependent - Patient 0%     Transfers Chair/bed transfer  Transfers assist     Chair/bed transfer assist level: Dependent - mechanical lift     Locomotion Ambulation   Ambulation assist   Ambulation activity did not occur: Safety/medical concerns          Walk 10 feet activity   Assist  Walk 10 feet activity did not occur: Safety/medical concerns        Walk 50 feet activity   Assist Walk 50 feet with 2 turns activity did not occur: Safety/medical concerns         Walk 150 feet activity   Assist Walk 150 feet activity did not occur: Safety/medical concerns  Walk 10 feet on uneven surface  activity   Assist Walk 10 feet on uneven surfaces activity did not occur: Safety/medical concerns         Wheelchair     Assist Will patient use wheelchair at discharge?: Yes Type of Wheelchair: Manual Wheelchair activity did not occur: Safety/medical concerns  Wheelchair assist level: Minimal Assistance - Patient > 75% Max wheelchair distance: 12'    Wheelchair 50 feet with 2 turns activity    Assist    Wheelchair 50 feet with 2 turns activity did not occur: Safety/medical concerns   Assist Level: Minimal Assistance - Patient > 75%   Wheelchair 150 feet activity     Assist  Wheelchair 150 feet activity did not occur: Safety/medical concerns       Blood pressure 126/71, pulse 78, temperature (!) 97.5 F (36.4 C), resp. rate 20, height 5' 11" (1.803 m), weight 83 kg, SpO2 93 %.  Medical Problem List and Plan: 1.L frontal craniotomysecondary to LDH/SAH from fall -patient may Shower if incisions covered -ELOS/Goals: 2-3 weeks- mod I to supervision             Continue CIR 2. Impaired mobility: -DVT/anticoagulation:  Pharmaceutical: Continue Lovenox -antiplatelet therapy: N/a 3. Low back pain: continue air mattress. Resolved. Dicussed XR findings of degeneration.  4. Mood: LCSW to follow for evaluation and support.  -antipsychotic agents: N/A 5. Neuropsych: This patient is capable of making decisions on his own behalf. 6. Multiple deep tissue injuries/Skin/Wound Care: Continue Air mattress overlay with pressure relief measures.  --Continue vitamins and collagen to promote wound healing  7. Fluids/Electrolytes/Nutrition: Monitor I/O. Check lytes in am. 8. SDH (concerns of bacterial infection): S/p Burr hole evacuation  9. Strep anginosis bacteremia: Continue Ceftriaxone/Metronidazole X 6 weeks w/end date  10 Liver abscess s/p JP drain placement: Continue flushes TID and document output. . 11. New onset seizures: Continue Keppra bid till follow up with neuro on outpatient basis.  12. Left hip ORIF: WBAT. Will consult ortho to eval given worsening pain.  13. T2DM: Hgb A1C- 10.8. Will monitor BS ac/hs. Home regimen Levemir 10u am/15u pm with SSI.  --continue Levemir bid with SSI and titrate insulin as indicated.           CBG 193-273: increase levemir to 13U 14. BPH/Urinary retention: Foley replaced--keep in place for decompression for few days than start bladder program.  --will increase flomax to 0.8 mg/HS  15. Pancreatic cancer with recurrence: Continue Creon with meals. --follow up with Hem/onc and Radiation Onc after discharge.  -wife asking about plans for radiation/chemo: will contact oncology to ask them to discuss with wife.  16. Low back pain/muscle spasms- will start Robaxin prn and lidoderm patches 1- 8pm to 8am.              4/16: denies pain: decrease robaxin to q12H prn.  17. Hypokalemia: schedule supplement and repeat BMP tomorrow 18. Overweight BMI 25.52: provide counseling.  19. Elevated alk phos: increasing,  will notify oncology.     LOS: 5 days A FACE TO FACE EVALUATION WAS PERFORMED  Clide Deutscher Florene Brill 11/17/2020, 9:24 AM

## 2020-11-17 NOTE — Progress Notes (Signed)
Speech Language Pathology Daily Session Note  Patient Details  Name: ROCIO WOLAK MRN: [id number] Date of Birth: [birth year]  Today's Date: 11/17/2020 SLP Individual Time: 1402-1430 SLP Individual Time Calculation (min): 28 min  Short Term Goals: Week 1: SLP Short Term Goal 1 (Week 1): Pt will demonstrate anticipatory awareness for home with mod A verbal cues. SLP Short Term Goal 2 (Week 1): Pt will recall 2 events from previous therapy sessions with min A verbal cues. SLP Short Term Goal 3 (Week 1): Pt will selectively attend to functional tasks with Min A verbal cues. SLP Short Term Goal 4 (Week 1): Pt will complete mildly complex problem solving tasks with min A verbal cues.  Skilled Therapeutic Interventions:Skilled ST services focused on cognitive skills. SLP facilitated mildly complex problem solving and error awareness skills in calendar task. Pt was able to record basic information mod I and required min A verbal cues to organize mildly complex problem information. For example, pt was unable to locate "the week of the 7th" and indicate an event over multiple days. Pt was unable to complete task due to time, therefore recommend to complete next session. Pt was left in room with wife, call bell within reach and bed alarm set. SLP recommends to continue skilled services.     Pain Pain Assessment Pain Score: 0-No pain  Therapy/Group: Individual Therapy  Tyshan Enderle  [facility] 11/17/2020, 4:46 PM

## 2020-11-17 NOTE — Progress Notes (Signed)
Occupational Therapy Session Note  Patient Details  Name: Colton Bush MRN: [id number] Date of Birth: 03/14/1956  Today's Date: 11/17/2020 OT Individual Time: 0700-0758 OT Individual Time Calculation (min): 58 min    Short Term Goals: Week 1:  OT Short Term Goal 1 (Week 1): P twill transfer with +1 A and LRAD ot BSC OT Short Term Goal 2 (Week 1): Pt will sit to stand consistently with MOD A in prep for clothing managment OT Short Term Goal 3 (Week 1): Pt will don shirt iwht MIN A OT Short Term Goal 4 (Week 1): Pt will groom seated EOB with S  Skilled Therapeutic Interventions/Progress Updates:    OT intervention with focus on bed mobility, sit<>stand with St[place], activity tolerance, LB dressing, and grooming. Pt required assistance from wife to comb hair. Requested wife to bring in comfortable clothing for pt to wear. Pt perseverated on using "HurriCanes" to walk with vs RW. Pt adamant that he does not want to use RW. Explained to pt that first goal should be to stand without the assistance of the [place]. Bed mobility with mod A. Sit<>stand X 4 in Stedy with Max A.  Pt rolled to sink for ADLs. Pt continued to perseverate on using canes to walk. Pt requires max verbal cues to redirect to task. Pt incontinent of bowel with no awareness. Pt returned to bed. Pt dependent for hygiene while carrying on irrelevant conversation. Pt remained in bed with all needs within reach.   Therapy Documentation Precautions:  Restrictions Weight Bearing Restrictions: No RLE Weight Bearing: Weight bearing as tolerated  Pain: Pt c/o Rt toe pain when standing (unrated); RN Clifton James aware  Therapy/Group: Individual Therapy  [name] 11/17/2020, 8:04 AM

## 2020-11-18 ENCOUNTER — Ambulatory Visit: Payer: Medicare Other | Admitting: Radiation Oncology

## 2020-11-18 ENCOUNTER — Inpatient Hospital Stay (HOSPITAL_COMMUNITY): Payer: Medicare Other

## 2020-11-18 LAB — GLUCOSE, CAPILLARY
Glucose-Capillary: 98 mg/dL (ref 70–99)
Glucose-Capillary: 154 mg/dL — ABNORMAL HIGH (ref 70–99)
Glucose-Capillary: 227 mg/dL — ABNORMAL HIGH (ref 70–99)
Glucose-Capillary: 236 mg/dL — ABNORMAL HIGH (ref 70–99)

## 2020-11-18 LAB — TROPONIN I (HIGH SENSITIVITY)
Troponin I (High Sensitivity): 10 ng/L (ref <18)
Troponin I (High Sensitivity): 10 ng/L (ref <18)

## 2020-11-18 MED ORDER — HYDROCODONE-ACETAMINOPHEN 5-325 MG PO TABS: 1.0000 | ORAL_TABLET | ORAL | Status: DC | PRN

## 2020-11-18 MED ORDER — TRAZODONE HCL 50 MG PO TABS: 50.0000 mg | ORAL_TABLET | Freq: Every evening | ORAL | Status: DC | PRN

## 2020-11-18 MED ORDER — HYDROCODONE-ACETAMINOPHEN 7.5-325 MG PO TABS
1.0000 | ORAL_TABLET | Freq: Three times a day (TID) | ORAL | Status: DC
  Administered 2020-11-18: 1 via ORAL
  Filled 2020-11-18: qty 1

## 2020-11-18 MED ORDER — HYDROCODONE-ACETAMINOPHEN 5-325 MG PO TABS
1.0000 | ORAL_TABLET | ORAL | Status: DC | PRN
  Administered 2020-11-20 – 2020-11-23 (×2): 1 via ORAL
  Filled 2020-11-18 (×3): qty 1

## 2020-11-18 MED ORDER — OXYCODONE HCL ER 10 MG PO T12A: 10.0000 mg | EXTENDED_RELEASE_TABLET | Freq: Two times a day (BID) | ORAL | Status: DC

## 2020-11-18 MED ORDER — MORPHINE SULFATE ER 15 MG PO TBCR
30.0000 mg | EXTENDED_RELEASE_TABLET | Freq: Two times a day (BID) | ORAL | Status: DC
  Administered 2020-11-18 – 2020-12-01 (×26): 30 mg via ORAL
  Filled 2020-11-18 (×26): qty 2

## 2020-11-18 NOTE — Progress Notes (Signed)
PROGRESS NOTE   Subjective/Complaints: Hip pain has improved with Norco- discussed with Pam adding MS contin for better pain control Oncology has discussed radiation plan with wife Patient has no other complaints  ROS: denies low back pain, +right hip and knee pain with activity.   Objective:   DG Lumbar Spine 2-3 Views  Result Date: 11/16/2020 CLINICAL DATA:  Pain right flank EXAM: LUMBAR SPINE - 2-3 VIEW COMPARISON:  CT abdomen pelvis 08/31/2020 FINDINGS: Mild retrolisthesis L2-3 and L3-4. Diffuse disc degeneration with disc space narrowing and spurring throughout the lumbar spine. Mild lumbar kyphosis Negative for fracture or mass Pigtail drainage catheter right upper quadrant. Bowel staples in the region of the stomach. IMPRESSION: Multilevel lumbar degenerative change.  No acute abnormality. Electronically Signed   By: Franchot Gallo M.D.   On: 11/16/2020 16:45   DG HIP UNILAT WITH PELVIS 2-3 VIEWS RIGHT  Result Date: 11/16/2020 CLINICAL DATA:  Follow-up hip fracture EXAM: DG HIP (WITH OR WITHOUT PELVIS) 3V RIGHT COMPARISON:  10/25/2020 FINDINGS: Medullary rod and proximal fixation screw are again seen and stable. Changes of prior proximal right femoral fracture are seen with fracture fragments in near anatomic alignment. No new focal abnormality is noted. IMPRESSION: Status post ORIF of proximal right femoral fracture. Electronically Signed   By: Inez Catalina M.D.   On: 11/16/2020 21:16   No results for input(s): WBC, HGB, HCT, PLT in the last 72 hours. Recent Labs    11/16/20 0525 11/17/20 0524  NA 137 135  K 3.1* 3.6  CL 103 102  CO2 28 28  GLUCOSE 97 107*  BUN 7* 6*  CREATININE 0.42* 0.44*  CALCIUM 7.9* 8.1*    Intake/Output Summary (Last 24 hours) at 11/18/2020 1043 Last data filed at 11/18/2020 0800 Gross per 24 hour  Intake 470 ml  Output 1547 ml  Net -1077 ml     Pressure Injury 11/07/20 Heel Right Stage 1  -  Intact skin with non-blanchable redness of a localized area usually over a bony prominence. (Active)  11/07/20 0800  Location: Heel  Location Orientation: Right  Staging: Stage 1 -  Intact skin with non-blanchable redness of a localized area usually over a bony prominence.  Wound Description (Comments):   Present on Admission:      Pressure Injury 11/07/20 Sacrum Stage 2 -  Partial thickness loss of dermis presenting as a shallow open injury with a red, pink wound bed without slough. 4/15 skin is now broken; was documented as stage 1. now a stage 2. (Active)  11/07/20 2000  Location: Sacrum  Location Orientation:   Staging: Stage 2 -  Partial thickness loss of dermis presenting as a shallow open injury with a red, pink wound bed without slough.  Wound Description (Comments): 4/15 skin is now broken; was documented as stage 1. now a stage 2.  Present on Admission:      Pressure Injury 11/07/20 Heel Right Deep Tissue Pressure Injury - Purple or maroon localized area of discolored intact skin or blood-filled blister due to damage of underlying soft tissue from pressure and/or shear. (Active)  11/07/20 2000  Location: Heel  Location Orientation: Right  Staging: Deep Tissue  Pressure Injury - Purple or maroon localized area of discolored intact skin or blood-filled blister due to damage of underlying soft tissue from pressure and/or shear.  Wound Description (Comments):   Present on Admission:     Physical Exam: Vital Signs Blood pressure 128/70, pulse 77, temperature 98.3 F (36.8 C), temperature source Oral, resp. rate 18, height 5' 11"  (1.803 m), weight 83 kg, SpO2 92 %.  Gen: no distress, normal appearing HEENT: oral mucosa pink and moist, NCAT Cardio: Reg rate Chest: normal effort, normal rate of breathing Abdominal:  General: There is no distension.  Comments: Well healed old midline incision. RUQ drains in place--one with bilious drainage and other with blood tinged  drainage.  Soft, slightly TTP diffusely- no rebound; hypoactive BS Genitourinary: Penis: Normal.  Comments: Light amber urine in foley as it was placed under sterile conditions Musculoskeletal:  Cervical back: Normal range of motionand neck supple.  Comments: RLE with multiple small stapled incisions.   UE's 5-/5 but equal B/L  LE's - 4+/5 in HF, KE, DF and PF B/L  Skin: General: Skin is warmand dry.  Comments: RLE incisions as well as abd incisions already noted L forearm/wrist IVs' look OK, stage 2 to sacrum Neurological:  Mental Status: He is oriented to person, place, and time.  Comments: Soft voice with mild left facial weakness. Slow to respond but able to follow basic motor commands.   Slightly perseverative on MS exam, neuro exam Slow to speak- word searching, but used appropriate words Intact to light touch in all 4 extremities Psychiatric:  Comments: flat   Assessment/Plan: 1. Functional deficits which require 3+ hours per day of interdisciplinary therapy in a comprehensive inpatient rehab setting.  Physiatrist is providing close team supervision and 24 hour management of active medical problems listed below.  Physiatrist and rehab team continue to assess barriers to discharge/monitor patient progress toward functional and medical goals  Care Tool:  Bathing    Body parts bathed by patient: Right arm,Left arm,Chest,Abdomen,Face   Body parts bathed by helper: Front perineal area,Buttocks,Right upper leg,Left upper leg,Right lower leg,Left lower leg     Bathing assist Assist Level: Maximal Assistance - Patient 24 - 49%     Upper Body Dressing/Undressing Upper body dressing   What is the patient wearing?: Pull over shirt    Upper body assist Assist Level: Moderate Assistance - Patient 50 - 74%    Lower Body Dressing/Undressing Lower body dressing      What is the patient wearing?: Pants     Lower body assist Assist  for lower body dressing: Moderate Assistance - Patient 50 - 74%     Toileting Toileting    Toileting assist Assist for toileting: Dependent - Patient 0%     Transfers Chair/bed transfer  Transfers assist     Chair/bed transfer assist level: Dependent - mechanical lift     Locomotion Ambulation   Ambulation assist   Ambulation activity did not occur: Safety/medical concerns          Walk 10 feet activity   Assist  Walk 10 feet activity did not occur: Safety/medical concerns        Walk 50 feet activity   Assist Walk 50 feet with 2 turns activity did not occur: Safety/medical concerns         Walk 150 feet activity   Assist Walk 150 feet activity did not occur: Safety/medical concerns         Walk 10 feet on uneven surface  activity   Assist Walk 10 feet on uneven surfaces activity did not occur: Safety/medical concerns         Wheelchair     Assist Will patient use wheelchair at discharge?: Yes Type of Wheelchair: Manual Wheelchair activity did not occur: Safety/medical concerns  Wheelchair assist level: Minimal Assistance - Patient > 75% Max wheelchair distance: 71'    Wheelchair 50 feet with 2 turns activity    Assist    Wheelchair 50 feet with 2 turns activity did not occur: Safety/medical concerns   Assist Level: Minimal Assistance - Patient > 75%   Wheelchair 150 feet activity     Assist  Wheelchair 150 feet activity did not occur: Safety/medical concerns       Blood pressure 128/70, pulse 77, temperature 98.3 F (36.8 C), temperature source Oral, resp. rate 18, height 5' 11"  (1.803 m), weight 83 kg, SpO2 92 %.  Medical Problem List and Plan: 1.L frontal craniotomysecondary to LDH/SAH from fall -patient may Shower if incisions covered -ELOS/Goals: 2-3 weeks- mod I to supervision             Continue CIR 2. Impaired mobility: -DVT/anticoagulation: Pharmaceutical: Continue  Lovenox -antiplatelet therapy: N/a 3. Low back pain: continue air mattress. Resolved. Dicussed XR findings of degeneration.  4. Mood: LCSW to follow for evaluation and support.  -antipsychotic agents: N/A 5. Neuropsych: This patient is capable of making decisions on his own behalf. 6. Multiple deep tissue injuries/Skin/Wound Care: Continue Air mattress overlay with pressure relief measures.  --Continue vitamins and collagen to promote wound healing  7. Fluids/Electrolytes/Nutrition: Monitor I/O. Check lytes in am. 8. SDH (concerns of bacterial infection): S/p Burr hole evacuation  9. Strep anginosis bacteremia: Continue Ceftriaxone/Metronidazole X 6 weeks w/end date  10 Liver abscess s/p JP drain placement: Continue flushes TID and document output. . 11. New onset seizures: Continue Keppra bid till follow up with neuro on outpatient basis.  12. Left hip ORIF: WBAT. Will consult ortho to eval given worsening pain. Add MS contin for better pain control/participation with therapy 13. T2DM: Hgb A1C- 10.8. Will monitor BS ac/hs. Home regimen Levemir 10u am/15u pm with SSI.  --continue Levemir bid with SSI and titrate insulin as indicated.           CBG 193-273: increase levemir to 13U  Improved but still up to 194: d/c Juven between meals since contains sugar 14. BPH/Urinary retention: Foley replaced--keep in place for decompression for few days than start bladder program.  --will increase flomax to 0.8 mg/HS  15. Pancreatic cancer with recurrence: Continue Creon with meals. --follow up with Hem/onc and Radiation Onc after discharge.  -wife asking about plans for radiation/chemo: will contact oncology to ask them to discuss with wife.  16. Low back pain/muscle spasms- will start Robaxin prn and lidoderm patches 1- 8pm to 8am.              4/16: denies pain: decrease robaxin to q12H prn.  17. Hypokalemia: schedule  supplement and repeat BMP tomorrow 18. Overweight BMI 25.52: provide counseling.  19. Elevated alk phos: increasing, will notify oncology.     LOS: 6 days A FACE TO FACE EVALUATION WAS PERFORMED  Colton Bush 11/18/2020, 10:43 AM

## 2020-11-18 NOTE — Progress Notes (Signed)
Speech Language Pathology Daily Session Note  Patient Details  Name: Colton Bush MRN: [id number] Date of Birth: 1955-11-25  Today's Date: 11/18/2020 SLP Individual Time: 0915-1000 SLP Individual Time Calculation (min): 45 min  Short Term Goals: Week 1: SLP Short Term Goal 1 (Week 1): Pt will demonstrate anticipatory awareness for home with mod A verbal cues. SLP Short Term Goal 2 (Week 1): Pt will recall 2 events from previous therapy sessions with min A verbal cues. SLP Short Term Goal 3 (Week 1): Pt will selectively attend to functional tasks with Min A verbal cues. SLP Short Term Goal 4 (Week 1): Pt will complete mildly complex problem solving tasks with min A verbal cues.  Skilled Therapeutic Interventions: Skilled SLP intervention focused on cognition. Money management task with coins and dollar bills completed with min A verbal cues for answering mental calculations. He required increased time with calculations and needed visual cues for attention to errors. Pt able to recall 2-3 tasks completed in PT/OT session with mod A verbal cues for direction in responses. Pt demonstrated understanding of medication list and was able to answer relevant questions regarding medications and times taken with Supervision. Pt left in  Bed with nursing staff in room. Cont with therapy per plan of care.      Pain Pain Assessment Pain Scale: Faces Pain Score: 0-No pain Faces Pain Scale: No hurt  Therapy/Group: Individual Therapy  Darrol Poke Gehrig Patras 11/18/2020, 2:19 PM

## 2020-11-18 NOTE — Progress Notes (Signed)
Occupational Therapy Weekly Progress Note  Patient Details  Name: Colton Bush MRN: [id number] Date of Birth: 03/23/1956  Beginning of progress report period: November 13, 2020 End of progress report period: November 18, 2020  Patient has met 1 of 4 short term goals.  Pt progress has been slow since admission. Pt requires max verbal cues for attention to task and redirection. Pt with tangential and perseverative conversation throughout therapy sessions. Bed mobility with max A. Sitting balance with mod A EOB. Sit<>stand and transfers with max A in [place] with max A for upright posture. Pt min A for UB dressing tasks. Pt min A for grooming tasks. LB dressing with tot A sit<>stand in [place] or bed level. Dependent for toileting at bed level. Pt incontinent of bowel with no awareness.   Patient continues to demonstrate the following deficits: muscle weakness, decreased cardiorespiratoy endurance, impaired timing and sequencing, unbalanced muscle activation, decreased coordination and decreased motor planning, decreased motor planning, decreased initiation, decreased attention, decreased awareness, decreased problem solving, decreased safety awareness, decreased memory and delayed processing and decreased sitting balance, decreased standing balance, decreased postural control and decreased balance strategies and therefore will continue to benefit from skilled OT intervention to enhance overall performance with BADL and Reduce care partner burden.  Patient progressing toward long term goals..  Continue plan of care.  OT Short Term Goals Week 1:  OT Short Term Goal 1 (Week 1): P twill transfer with +1 A and LRAD ot BSC OT Short Term Goal 1 - Progress (Week 1): Progressing toward goal OT Short Term Goal 2 (Week 1): Pt will sit to stand consistently with MOD A in prep for clothing managment OT Short Term Goal 2 - Progress (Week 1): Progressing toward goal OT Short Term Goal 3 (Week 1): Pt will don shirt iwht  MIN A OT Short Term Goal 3 - Progress (Week 1): Met OT Short Term Goal 4 (Week 1): Pt will groom seated EOB with S OT Short Term Goal 4 - Progress (Week 1): Progressing toward goal Week 2:  OT Short Term Goal 1 (Week 2): P twill transfer with +1 A and LRAD ot BSC OT Short Term Goal 2 (Week 2): Pt will sit to stand consistently with MOD A in prep for clothing managment OT Short Term Goal 3 (Week 2): Pt will groom seated EOB with Hartley Barefoot 11/18/2020, 6:56 AM

## 2020-11-18 NOTE — Progress Notes (Signed)
Occupational Therapy Session Note  Patient Details  Name: Colton Bush MRN: [id number] Date of Birth: 04-25-1956  Today's Date: 11/18/2020 OT Individual Time: 1898-4210 OT Individual Time Calculation (min): 53 min    Short Term Goals: Week 2:  OT Short Term Goal 1 (Week 2): P twill transfer with +1 A and LRAD ot BSC OT Short Term Goal 2 (Week 2): Pt will sit to stand consistently with MOD A in prep for clothing managment OT Short Term Goal 3 (Week 2): Pt will groom seated EOB with S  Skilled Therapeutic Interventions/Progress Updates:    Pt resting in bed upon arrival. Pt c/o pain as noted below. Pt incontinent of bowel. Pt stated, "I thought I might have but wasn't sure." Pt dependent for hygiene and LB dressing at bed level. OT intervention with focus on bed mobility. Pt rolling in bed using bed rails with min A. Pt conversation perseverative about cars throughout session. Continued with discharge planning and educated on rehab goals and expectations. Pt remained in bed with all needs within reach.  Therapy Documentation Precautions:  Restrictions Weight Bearing Restrictions: No RLE Weight Bearing: Weight bearing as tolerated Pain: Pain Assessment Pain Scale: 0-10 Pain Score: 3  Pain Type: Acute pain Pain Location: Back Pain Descriptors / Indicators: Aching Pain Intervention(s): repositioned   Therapy/Group: Individual Therapy  [name] 11/18/2020, 7:57 AM

## 2020-11-18 NOTE — Progress Notes (Signed)
Occupational Therapy Session Note  Patient Details  Name: Colton Bush MRN: [id number] Date of Birth: [birth year]  Today's Date: 11/18/2020 OT Individual Time: 1400-1415 OT Individual Time Calculation (min): 15 min    Short Term Goals: Week 2:  OT Short Term Goal 1 (Week 2): P twill transfer with +1 A and LRAD ot BSC OT Short Term Goal 2 (Week 2): Pt will sit to stand consistently with MOD A in prep for clothing managment OT Short Term Goal 3 (Week 2): Pt will groom seated EOB with S  Skilled Therapeutic Interventions/Progress Updates:    Pt resting in bed upon arrival visiting with his cousin. Pt reports considerable back pain. Pt states he recently returned from xray but couldn't recall the reason or what was xrayed. Pt declined getting OOB at this time. Pt missed 15 mins skilled OT services.   Therapy Documentation Precautions:  Restrictions Weight Bearing Restrictions: No RLE Weight Bearing: Weight bearing as tolerated General: General OT Amount of Missed Time: 15 Minutes PT Missed Treatment Reason: Patient fatigue;Pain Vital Signs:   Pain: Pt reports considerable back pain; repositioned    Therapy/Group: Individual Therapy  [name] 11/18/2020, 2:23 PM

## 2020-11-18 NOTE — Progress Notes (Signed)
Physical Therapy Session Note  Patient Details  Name: Colton Bush MRN: [id number] Date of Birth: 12-10-1955  Today's Date: 11/18/2020 PT Individual Time: 1100-1153 PT Individual Time Calculation (min): 53 min   Short Term Goals: Week 1:  PT Short Term Goal 1 (Week 1): pt to demonstrate supine<>sit min A PT Short Term Goal 2 (Week 1): pt to demonstrate bed<>chair transfers mod A with LRAD PT Short Term Goal 3 (Week 1): pt to demonstrate WC mobility 75 min A PT Short Term Goal 4 (Week 1): to initiate gait  Skilled Therapeutic Interventions/Progress Updates:    Pt received supine in bed, agreeable to PT session. Pt reports some pain in R knee at rest that increases during session. Pt also reports he has been experiencing intermittent chest pain for the past few weeks and has not been informing staff in the hopes it would go away. Notified nursing of pt's chest pain and educated patient on importance of letting staff know when he is feeling pain, especially chest pain. Supine to sit with CGA with use of bedrails and HOB elevated. Sit to stand with max A to stedy. Stedy transfer to w/c. Manual w/c propulsion 2 x 100 ft with use of BUE at [place]vision level. Sit to stand in // bars with max A with knees blocked. Pt exhibits improved upright stance this date. Pt reports dizziness in standing that improves once seated again in w/c. Pt also reports onset of light sensitivity and requests to return to his room. Sit to stand with total A x 2 from w/c to stedy. Stedy transfer back to bed. Sit to supine mod A needed for BLE management. Obtained TIS chair for patient to use for improved safety when sitting up OOB next session. Pt left seated in bed in care of nursing. Pt missed 7 min of scheduled therapy session due to pain and fatigue.  Therapy Documentation Precautions:  Restrictions Weight Bearing Restrictions: No RLE Weight Bearing: Weight bearing as tolerated General: PT Amount of Missed Time (min):  7 Minutes PT Missed Treatment Reason: Patient fatigue;Pain    Therapy/Group: Individual Therapy   Excell Seltzer, PT, DPT, CSRS  11/18/2020, 11:55 AM

## 2020-11-19 ENCOUNTER — Inpatient Hospital Stay (HOSPITAL_COMMUNITY): Payer: Medicare Other

## 2020-11-19 ENCOUNTER — Ambulatory Visit: Payer: Medicare Other | Admitting: Radiation Oncology

## 2020-11-19 HISTORY — PX: IR THORACENTESIS ASP PLEURAL SPACE W/IMG GUIDE: IMG5380

## 2020-11-19 LAB — GLUCOSE, CAPILLARY
Glucose-Capillary: 80 mg/dL (ref 70–99)
Glucose-Capillary: 220 mg/dL — ABNORMAL HIGH (ref 70–99)
Glucose-Capillary: 197 mg/dL — ABNORMAL HIGH (ref 70–99)
Glucose-Capillary: 158 mg/dL — ABNORMAL HIGH (ref 70–99)

## 2020-11-19 LAB — ALBUMIN, PLEURAL OR PERITONEAL FLUID: Albumin, Fluid: 1.6 g/dL

## 2020-11-19 LAB — BODY FLUID CELL COUNT WITH DIFFERENTIAL
Eos, Fluid: 2 %
Lymphs, Fluid: 38 %
Monocyte-Macrophage-Serous Fluid: 8 % — ABNORMAL LOW (ref 50–90)
Neutrophil Count, Fluid: 51 % — ABNORMAL HIGH (ref 0–25)
Other Cells, Fluid: 1 %
Total Nucleated Cell Count, Fluid: 2342 cu mm — ABNORMAL HIGH (ref 0–1000)

## 2020-11-19 LAB — PROTEIN, PLEURAL OR PERITONEAL FLUID: Total protein, fluid: 4.4 g/dL

## 2020-11-19 MED ORDER — LIDOCAINE HCL 1 % IJ SOLN
INTRAMUSCULAR | Status: AC
  Filled 2020-11-19: qty 20

## 2020-11-19 MED ORDER — SODIUM CHLORIDE 0.9% FLUSH
10.0000 mL | INTRAVENOUS | Status: DC
  Administered 2020-11-19 – 2020-11-25 (×7): 10 mL

## 2020-11-19 NOTE — Procedures (Signed)
PROCEDURE SUMMARY:  Successful US guided right thoracentesis. Yielded 900 ml of amber-colored fluid. Pt tolerated procedure well. No immediate complications.  Specimen sent for labs. CXR ordered; pending  EBL < 2 mL  Theresa Duty, NP 11/19/2020 11:56 AM

## 2020-11-19 NOTE — Progress Notes (Signed)
Occupational Therapy Session Note  Patient Details  Name: Colton Bush MRN: [id number] Date of Birth: 08-25-1955  Today's Date: 11/19/2020 OT Individual Time: [phone] OT Individual Time Calculation (min): 53 min    Short Term Goals: Week 1:  OT Short Term Goal 1 (Week 1): P twill transfer with +1 A and LRAD ot BSC OT Short Term Goal 1 - Progress (Week 1): Progressing toward goal OT Short Term Goal 2 (Week 1): Pt will sit to stand consistently with MOD A in prep for clothing managment OT Short Term Goal 2 - Progress (Week 1): Progressing toward goal OT Short Term Goal 3 (Week 1): Pt will don shirt iwht MIN A OT Short Term Goal 3 - Progress (Week 1): Met OT Short Term Goal 4 (Week 1): Pt will groom seated EOB with S OT Short Term Goal 4 - Progress (Week 1): Progressing toward goal Week 2:  OT Short Term Goal 1 (Week 2): P twill transfer with +1 A and LRAD ot BSC OT Short Term Goal 2 (Week 2): Pt will sit to stand consistently with MOD A in prep for clothing managment OT Short Term Goal 3 (Week 2): Pt will groom seated EOB with S   Skilled Therapeutic Interventions/Progress Updates:    Pt greeted at time of session supine in bed resting agreeable to OT session, fatigued but agreeable. Supine > sit EOB Min A with extended time and bed features. At time of sitting up noted pt's bed and shirt to be soaked around area with abdominal drains, checked with nurse and she will take a look when changing dressings, stating drains did same thing last night. No pain from pt. Changed shirt with Min A sitting EOB and therapist assist with washing back area. Sit > stand in [place] Mod/Max A from bed surface and later CGA/Min from perched position in stedy. Stedy > TIS chair and set up at sink for face washing. Pt hyperverbal and tangental throughout session, requiring frequent verbal cues for redirecting and problem solving. Up in TIS slightly reclined with alarm on call bell in reach.   Therapy  Documentation Precautions:  Restrictions Weight Bearing Restrictions: No RLE Weight Bearing: Weight bearing as tolerated    Therapy/Group: Individual Therapy  Viona Gilmore 11/19/2020, 7:16 AM

## 2020-11-19 NOTE — Progress Notes (Signed)
Occupational Therapy Session Note  Patient Details  Name: Colton Bush MRN: [id number] Date of Birth: 1956-01-05  Today's Date: 11/19/2020 OT Individual Time: 1430-1530 OT Individual Time Calculation (min): 60 min    Short Term Goals: Week 2:  OT Short Term Goal 1 (Week 2): P twill transfer with +1 A and LRAD ot BSC OT Short Term Goal 2 (Week 2): Pt will sit to stand consistently with MOD A in prep for clothing managment OT Short Term Goal 3 (Week 2): Pt will groom seated EOB with S  Skilled Therapeutic Interventions/Progress Updates:    Pt received in bed sleeping but awoke fairly easily. Pt able to carry on a more fluent conversation that did not become tangential. Pt able to tell me accurate information about his PMH and progression of events to rehab.   Pt agreeable to sitting on EOB. Mod A to sit to EOB with pt assisting, once sitting worked on balance and pt able to progress to sitting with only Supervision with R hand on bed rail for support. Pt able to tolerate sitting for 20 minutes. Also used L hand on bed for therapist to work on R arm strength with resisted exercise as pt stated that arm was weaker.   Mod A back to supine and pt worked on actively pulling himself up in the bed with only CGA.  BUE strength with dowel bar exercises in the bed. Fairly good participation and attention with exercises.   His wife arrived at the end of the session and said she was upset that pt has not started walking.  "I dont see why he can not just take some steps as he has to do that before coming home". Educated her on pt's current level and that he needs mod -max A with stedy lift. She then asked,  "well can you get him up in that and then just let go?"  Wife will need demonstration to understand pt's situation better.  She also did not think he had any cognitive issues and did not think the SDH led to any difficulties.  Brief education with her on that topic, but a visit from the  neuropsychologist would be helpful.  Pt resting in bed with all needs met.   Therapy Documentation Precautions:  Restrictions Weight Bearing Restrictions: No RLE Weight Bearing: Weight bearing as tolerated    Vital Signs: Therapy Vitals Temp: 99 F (37.2 C) Pulse Rate: (!) 106 Resp: 17 BP: 129/75 Patient Position (if appropriate): Lying Oxygen Therapy SpO2: 91 % O2 Device: Room Air Pain: Pain Assessment Pain Scale: 0-10 Pain Score: 0-No pain Pain Location: Hip ADL: ADL Grooming: Minimal assistance Where Assessed-Grooming: Edge of bed Upper Body Bathing: Minimal assistance Where Assessed-Upper Body Bathing:  (bsc) Lower Body Bathing: Maximal assistance (bsc) Upper Body Dressing: Moderate assistance Where Assessed-Upper Body Dressing:  (bsc) Lower Body Dressing: Dependent Where Assessed-Lower Body Dressing:  (BSC iwth stedy) Toileting: Dependent (stedy) Toilet Transfer: Dependent (stedy- MOD-MAX STS)   Therapy/Group: Individual Therapy  Ester Mabe 11/19/2020, 4:32 PM

## 2020-11-19 NOTE — Progress Notes (Addendum)
PROGRESS NOTE   Subjective/Complaints: Looks much more sleepy today, but appeared to perform better with therapy and was alert during session. Denies pain.  Discussed that we have consulted IR regarding his pleural effusion.   ROS: denies low back pain, +right hip and knee pain with activity, improved with MS contin  Objective:   DG Chest 2 View  Result Date: 11/18/2020 CLINICAL DATA:  Chest pain. EXAM: CHEST - 2 VIEW COMPARISON:  11/08/2020. FINDINGS: Borderline cardiomegaly. Diffuse right lung infiltrate. Moderate right pleural effusion. Mild atelectasis left lung base. No pneumothorax. Prior cervical spine fusion. IMPRESSION: 1.  Diffuse right lung infiltrate.  Moderate right pleural effusion. 2.  Mild left base subsegmental atelectasis. 3.  Borderline cardiomegaly. Electronically Signed   By: Marcello Moores  Register   On: 11/18/2020 15:18   No results for input(s): WBC, HGB, HCT, PLT in the last 72 hours. Recent Labs    11/17/20 0524  NA 135  K 3.6  CL 102  CO2 28  GLUCOSE 107*  BUN 6*  CREATININE 0.44*  CALCIUM 8.1*    Intake/Output Summary (Last 24 hours) at 11/19/2020 1111 Last data filed at 11/19/2020 7673 Gross per 24 hour  Intake 1000 ml  Output 1950 ml  Net -950 ml     Pressure Injury 11/07/20 Heel Right Stage 1 -  Intact skin with non-blanchable redness of a localized area usually over a bony prominence. (Active)  11/07/20 0800  Location: Heel  Location Orientation: Right  Staging: Stage 1 -  Intact skin with non-blanchable redness of a localized area usually over a bony prominence.  Wound Description (Comments):   Present on Admission:      Pressure Injury 11/07/20 Sacrum Stage 2 -  Partial thickness loss of dermis presenting as a shallow open injury with a red, pink wound bed without slough. 4/15 skin is now broken; was documented as stage 1. now a stage 2. (Active)  11/07/20 2000  Location: Sacrum  Location  Orientation:   Staging: Stage 2 -  Partial thickness loss of dermis presenting as a shallow open injury with a red, pink wound bed without slough.  Wound Description (Comments): 4/15 skin is now broken; was documented as stage 1. now a stage 2.  Present on Admission:      Pressure Injury 11/07/20 Heel Right Deep Tissue Pressure Injury - Purple or maroon localized area of discolored intact skin or blood-filled blister due to damage of underlying soft tissue from pressure and/or shear. (Active)  11/07/20 2000  Location: Heel  Location Orientation: Right  Staging: Deep Tissue Pressure Injury - Purple or maroon localized area of discolored intact skin or blood-filled blister due to damage of underlying soft tissue from pressure and/or shear.  Wound Description (Comments):   Present on Admission:     Physical Exam: Vital Signs Blood pressure 114/75, pulse 86, temperature 98.5 F (36.9 C), temperature source Oral, resp. rate 18, height 5' 11"  (1.803 m), weight 83 kg, SpO2 92 %.  Gen: no distress, normal appearing HEENT: oral mucosa pink and moist, NCAT Cardio: Reg rate Chest: normal effort, normal rate of breathing Abdominal:  General: There is no distension.  Comments: Well healed old  midline incision. RUQ drains in place--one with bilious drainage and other with blood tinged drainage.  Soft, slightly TTP diffusely- no rebound; hypoactive BS Genitourinary: Penis: Normal.  Comments: Light amber urine in foley as it was placed under sterile conditions Musculoskeletal:  Cervical back: Normal range of motionand neck supple.  Comments: RLE with multiple small stapled incisions.   UE's 5-/5 but equal B/L  LE's - 4+/5 in HF, KE, DF and PF B/L  Skin: General: Skin is warmand dry.  Comments: RLE incisions as well as abd incisions already noted L forearm/wrist IVs' look OK, stage 2 to sacrum Neurological:  Mental Status: He is oriented to person, place, and  time.  Comments: Soft voice with mild left facial weakness. Slow to respond but able to follow basic motor commands.   Slightly perseverative on MS exam, neuro exam Slow to speak- word searching, but used appropriate words Intact to light touch in all 4 extremities Psychiatric:  Comments: flat   Assessment/Plan: 1. Functional deficits which require 3+ hours per day of interdisciplinary therapy in a comprehensive inpatient rehab setting.  Physiatrist is providing close team supervision and 24 hour management of active medical problems listed below.  Physiatrist and rehab team continue to assess barriers to discharge/monitor patient progress toward functional and medical goals  Care Tool:  Bathing    Body parts bathed by patient: Right arm,Left arm,Chest,Abdomen,Face   Body parts bathed by helper: Front perineal area,Buttocks,Right upper leg,Left upper leg,Right lower leg,Left lower leg     Bathing assist Assist Level: Maximal Assistance - Patient 24 - 49%     Upper Body Dressing/Undressing Upper body dressing   What is the patient wearing?: Pull over shirt    Upper body assist Assist Level: Moderate Assistance - Patient 50 - 74%    Lower Body Dressing/Undressing Lower body dressing      What is the patient wearing?: Pants     Lower body assist Assist for lower body dressing: Moderate Assistance - Patient 50 - 74%     Toileting Toileting    Toileting assist Assist for toileting: Dependent - Patient 0%     Transfers Chair/bed transfer  Transfers assist     Chair/bed transfer assist level: Dependent - mechanical lift     Locomotion Ambulation   Ambulation assist   Ambulation activity did not occur: Safety/medical concerns          Walk 10 feet activity   Assist  Walk 10 feet activity did not occur: Safety/medical concerns        Walk 50 feet activity   Assist Walk 50 feet with 2 turns activity did not occur: Safety/medical  concerns         Walk 150 feet activity   Assist Walk 150 feet activity did not occur: Safety/medical concerns         Walk 10 feet on uneven surface  activity   Assist Walk 10 feet on uneven surfaces activity did not occur: Safety/medical concerns         Wheelchair     Assist Will patient use wheelchair at discharge?: Yes Type of Wheelchair: Manual Wheelchair activity did not occur: Safety/medical concerns  Wheelchair assist level: Supervision/Verbal cueing Max wheelchair distance: 100'    Wheelchair 50 feet with 2 turns activity    Assist    Wheelchair 50 feet with 2 turns activity did not occur: Safety/medical concerns   Assist Level: Supervision/Verbal cueing   Wheelchair 150 feet activity     Assist  Wheelchair 150 feet activity did not occur: Safety/medical concerns       Blood pressure 114/75, pulse 86, temperature 98.5 F (36.9 C), temperature source Oral, resp. rate 18, height 5' 11"  (1.803 m), weight 83 kg, SpO2 92 %.  Medical Problem List and Plan: 1.L frontal craniotomysecondary to LDH/SAH from fall -patient may Shower if incisions covered -ELOS/Goals: 2-3 weeks- mod I to supervision             Continue CIR 2. Impaired mobility: -DVT/anticoagulation: Pharmaceutical: Continue Lovenox -antiplatelet therapy: N/a 3. Low back pain: continue air mattress. Resolved. Dicussed XR findings of degeneration.  4. Mood: LCSW to follow for evaluation and support.  -antipsychotic agents: N/A 5. Neuropsych: This patient is capable of making decisions on his own behalf. 6. Multiple deep tissue injuries/Skin/Wound Care: Continue Air mattress overlay with pressure relief measures.  --Continue vitamins and collagen to promote wound healing  7. Fluids/Electrolytes/Nutrition: Monitor I/O. Check lytes in am. 8. SDH (concerns of bacterial infection): S/p Burr hole evacuation  9.  Strep anginosis bacteremia: Continue Ceftriaxone/Metronidazole X 6 weeks w/end date  10 Liver abscess s/p JP drain placement: Continue flushes TID and document output. . 11. New onset seizures: Continue Keppra bid till follow up with neuro on outpatient basis.  12. Left hip ORIF: WBAT. Will consult ortho to eval given worsening pain. Add MS contin for better pain control/participation with therapy. Appears to have helped, continue.  13. T2DM: Hgb A1C- 10.8. Will monitor BS ac/hs. Home regimen Levemir 10u am/15u pm with SSI.  --continue Levemir bid with SSI and titrate insulin as indicated.           CBG 80-236: continue levemir to 13U. d/c Juven between meals since contains sugar 14. BPH/Urinary retention: Foley replaced--keep in place for decompression for few days than start bladder program.  --will increase flomax to 0.8 mg/HS  15. Pancreatic cancer with recurrence: Continue Creon with meals. --follow up with Hem/onc and Radiation Onc after discharge.  -wife asking about plans for radiation/chemo: will contact oncology to ask them to discuss with wife.  16. Low back pain/muscle spasms- will start Robaxin prn and lidoderm patches 1- 8pm to 8am.              4/16: denies pain: decrease robaxin to q12H prn.  17. Hypokalemia: schedule supplement and repeat BMP tomorrow 18. Overweight BMI 25.52: provide counseling.  19. Elevated alk phos: increasing, will notify oncology. Oncology has discussed plans with wife for post- CIR follow-up.  20. Right sided pleural effusion: could be contributing to his chest pain. Consulted IR regarding drainage/pleural studies given active cancer. ADDENDUM: s/p successful thoracentesis    LOS: 7 days A FACE TO FACE EVALUATION WAS PERFORMED  Latavion Halls P Tishara Pizano 11/19/2020, 11:11 AM

## 2020-11-19 NOTE — Progress Notes (Signed)
Physical Therapy Session Note  Patient Details  Name: Colton Bush MRN: [id number] Date of Birth: 01/06/1956  Today's Date: 11/19/2020 PT Individual Time: 2683-4196 PT Individual Time Calculation (min): 30 min   Short Term Goals: Week 1:  PT Short Term Goal 1 (Week 1): pt to demonstrate supine<>sit min A PT Short Term Goal 2 (Week 1): pt to demonstrate bed<>chair transfers mod A with LRAD PT Short Term Goal 3 (Week 1): pt to demonstrate WC mobility 75 min A PT Short Term Goal 4 (Week 1): to initiate gait Week 2:    Week 3:     Skilled Therapeutic Interventions/Progress Updates:    Pain:  Pt reports knee pain.  Treatment to tolerance.  Rest breaks and repositioning as needed.  Pt initially oob in wc and agreeable to treatment session limited to UE therex only due to pain in knees.    Pt receiving meds during session.  Pt lethargic this am, keeps eyes closed 75% time.  UE therex w/2lb bar including Overhead press 2 x 8 Bicep curls 2/12  Pt transported to sink for ADLS  Washes face and performs oral care w/set up [place] hair w/set up and min assist  Core strengthening/sitting balance: W/chair tilted approx 15*, ant wt shift to upright, bilat overhead reach, maintain wt shift x 10 sec. Repeated x 5 reps  Pt left oob in wc w/alarm belt set and needs in reach, wc tilted for safety.    Therapy Documentation Precautions:  Restrictions Weight Bearing Restrictions: No RLE Weight Bearing: Weight bearing as tolerated    Therapy/Group: Individual Therapy  Callie Fielding, [place] 11/19/2020, 12:00 PM

## 2020-11-19 NOTE — Progress Notes (Signed)
Speech Language Pathology Daily Session Note  Patient Details  Name: Colton Bush MRN: [id number] Date of Birth: Jun 30, 1956  Today's Date: 11/19/2020 SLP Individual Time: 1346-1430 SLP Individual Time Calculation (min): 44 min  Short Term Goals: Week 1: SLP Short Term Goal 1 (Week 1): Pt will demonstrate anticipatory awareness for home with mod A verbal cues. SLP Short Term Goal 2 (Week 1): Pt will recall 2 events from previous therapy sessions with min A verbal cues. SLP Short Term Goal 3 (Week 1): Pt will selectively attend to functional tasks with Min A verbal cues. SLP Short Term Goal 4 (Week 1): Pt will complete mildly complex problem solving tasks with min A verbal cues.  Skilled Therapeutic Interventions: Pt seen for skilled ST with focus on cognitive goals, pt in bed after having fluid drained from lungs, lethargic but agreeable to all therapeutic tasks. SLP facilitating mildly complex problem solving and error awareness skills in calendar task from previous tx session. Pt was able to record basic information with mod A fading to min A verbal cues as pt became more alert/attentive to task. Pt with 100% accuracy in organizing information, however writing was mostly not legible, pt aware but unable to correct. Pt left in bed with alarm set and all needs within reach. Cont ST POC.   Pain Pain Assessment Pain Scale: 0-10 Pain Score: 4  Pain Location: Hip  Therapy/Group: Individual Therapy  Dewaine Conger 11/19/2020, 2:29 PM

## 2020-11-19 NOTE — Progress Notes (Signed)
This afternoon therapy informed nurse patient had complained about chest pain that had started a week ago. When asked patient said he didn't want to say anything sooner because he thought it would go away, but it didn't. Informed PA, charge nurse, and family.

## 2020-11-19 NOTE — Progress Notes (Addendum)
Referring Physician(s): Dr. Cyd Silence  Supervising Physician: Ruthann Cancer  Patient Status:  [facility] - In-pt  Chief Complaint: Hepatic abscess drain x2 placed 11/06/20  Subjective: Patient assessed in IR prior to thoracentesis. He was sleepy but conversant. He denies any pain/discomfort but he is ill-appearing.   Allergies: Patient has no known allergies.  Medications: Prior to Admission medications   Medication Sig Start Date End Date Taking? Authorizing Provider  cefTRIAXone (ROCEPHIN) IVPB Inject 2 g into the vein every 12 (twelve) hours. Indication:  liver abscess and possible infected subdural hematoma First Dose: No Last Day of Therapy:  12/21/20 Labs - Once weekly:  CBC/D and BMP, Labs - Every other week:  ESR and CRP Method of administration: IV Push Method of administration may be changed at the discretion of home infusion pharmacist based upon assessment of the patient and/or caregiver's ability to self-administer the medication ordered. 11/12/20   Debbe Odea, MD  CREON 6000-19000 units CPEP Take 1 capsule by mouth with breakfast, with lunch, and with evening meal.  04/15/20   [provider]  enoxaparin (LOVENOX) 40 MG/0.4ML injection Inject 0.4 mLs (40 mg total) into the skin daily. 11/13/20   Debbe Odea, MD  ferrous sulfate 325 (65 FE) MG tablet Take 325 mg daily with breakfast by mouth.    [provider]  insulin detemir (LEVEMIR) 100 UNIT/ML injection Inject 0.1 mLs (10 Units total) into the skin 2 (two) times daily. 11/12/20   Debbe Odea, MD  levETIRAcetam (KEPPRA) 1000 MG tablet Take 1 tablet (1,000 mg total) by mouth 2 (two) times daily. 11/12/20   Debbe Odea, MD  loratadine (CLARITIN) 10 MG tablet Take 10 mg by mouth daily.    [provider]  metroNIDAZOLE (FLAGYL) 500 MG tablet Take 1 tablet (500 mg total) by mouth 3 (three) times daily. Treat through 12/21/20 11/12/20   Debbe Odea, MD  Multiple Vitamin (MULTIVITAMIN WITH MINERALS)  TABS tablet Take 1 tablet by mouth daily.    [provider]  NOVOLOG FLEXPEN 100 UNIT/ML FlexPen GIVE EVERY MORNING WITH BREAKFAST AND EVERY EVENING WITH SUPPER PER SLIDING SCALE Patient taking differently: Inject 2-13 Units into the skin 2 (two) times daily. Sliding Scale : 101-150 - 2u 151-200 - 3u 201-250 - 5u 251-300 - 7u 301-350 - 9u 351- 400 - 11u 401-450 - 13u Greater than 150, alert MD 04/17/17   [name], MD  Omega-3 Fatty Acids (FISH OIL) 500 MG CAPS Take 500 mg by mouth daily.    [provider]  omeprazole (PRILOSEC) 20 MG capsule TAKE 1 CAPSULE DAILY 11/17/20   [name], MD  polyethylene glycol (MIRALAX / GLYCOLAX) 17 g packet Take 17 g by mouth daily as needed for mild constipation. 11/12/20   Debbe Odea, MD  rosuvastatin (CRESTOR) 20 MG tablet Take 1 tablet (20 mg total) by mouth daily. 07/22/20   [name], MD  tamsulosin (FLOMAX) 0.4 MG CAPS capsule Take 1 capsule (0.4 mg total) by mouth daily. 10/29/20   [place] Donnamarie Poag, DO     Vital Signs: BP (!) 104/53   Pulse 86   Temp 98.5 F (36.9 C) (Oral)   Resp 18   Ht 5' 11" (1.803 m)   Wt 182 lb 15.7 oz (83 kg)   SpO2 92%   BMI 25.52 kg/m   Physical Exam Constitutional:      General: He is not in acute distress.    Appearance: He is ill-appearing.  Pulmonary:  Effort: Pulmonary effort is normal.  Abdominal:     Tenderness: There is no abdominal tenderness.     Comments: RUQ drains to suction x2. Both drains easily flushed. Dressings over the sites were completely saturated with serosanguinous fluid. Dressings were changed while the patient was in IR. Skin insertion sites for both drains are slightly erythematous with small amount of dried drainage. Sutures and stat locks intact.   Skin:    General: Skin is warm and dry.  Neurological:     Mental Status: He is alert and oriented to person, place, and time.     Imaging: DG Chest 2 View  Result Date: 11/18/2020 CLINICAL  DATA:  Chest pain. EXAM: CHEST - 2 VIEW COMPARISON:  11/08/2020. FINDINGS: Borderline cardiomegaly. Diffuse right lung infiltrate. Moderate right pleural effusion. Mild atelectasis left lung base. No pneumothorax. Prior cervical spine fusion. IMPRESSION: 1.  Diffuse right lung infiltrate.  Moderate right pleural effusion. 2.  Mild left base subsegmental atelectasis. 3.  Borderline cardiomegaly. Electronically Signed   By: Thomas  Register   On: 11/18/2020 15:18   DG Lumbar Spine 2-3 Views  Result Date: 11/16/2020 CLINICAL DATA:  Pain right flank EXAM: LUMBAR SPINE - 2-3 VIEW COMPARISON:  CT abdomen pelvis 08/31/2020 FINDINGS: Mild retrolisthesis L2-3 and L3-4. Diffuse disc degeneration with disc space narrowing and spurring throughout the lumbar spine. Mild lumbar kyphosis Negative for fracture or mass Pigtail drainage catheter right upper quadrant. Bowel staples in the region of the stomach. IMPRESSION: Multilevel lumbar degenerative change.  No acute abnormality. Electronically Signed   By: Charles  Clark M.D.   On: 11/16/2020 16:45   DG HIP UNILAT WITH PELVIS 2-3 VIEWS RIGHT  Result Date: 11/16/2020 CLINICAL DATA:  Follow-up hip fracture EXAM: DG HIP (WITH OR WITHOUT PELVIS) 3V RIGHT COMPARISON:  10/25/2020 FINDINGS: Medullary rod and proximal fixation screw are again seen and stable. Changes of prior proximal right femoral fracture are seen with fracture fragments in near anatomic alignment. No new focal abnormality is noted. IMPRESSION: Status post ORIF of proximal right femoral fracture. Electronically Signed   By: Mark  Lukens M.D.   On: 11/16/2020 21:16    Labs:  CBC: Recent Labs    11/10/20 0705 11/10/20 0829 11/13/20 0447 11/15/20 0519  WBC 9.6 11.0* 10.5 7.9  HGB 10.0* 10.9* 10.0* 9.9*  HCT 30.6* 33.5* 30.6* 30.7*  PLT 415* 469* 404* 470*    COAGS: Recent Labs    10/19/20 0259 10/24/20 0814 11/05/20 1930 11/06/20 0337  INR 1.4* 1.2 1.5* 1.6*  APTT  --   --  32 36     BMP: Recent Labs    04/14/20 0925 07/09/20 0825 11/13/20 0447 11/15/20 0519 11/16/20 0525 11/17/20 0524  NA 141   < > 135 139 137 135  K 3.3*   < > 3.4* 3.2* 3.1* 3.6  CL 107   < > 102 105 103 102  CO2 27   < > 30 28 28 28  GLUCOSE 73   < > 196* 215* 97 107*  BUN 9   < > 6* 8 7* 6*  CALCIUM 8.8*   < > 7.7* 8.1* 7.9* 8.1*  CREATININE 0.71   < > 0.44* 0.48* 0.42* 0.44*  GFRNONAA >60   < > >60 >60 >60 >60  GFRAA >60  --   --   --   --   --    < > = values in this interval not displayed.    LIVER FUNCTION TESTS: Recent   Labs    11/09/20 0132 11/13/20 0447 11/15/20 0519 11/17/20 0524  BILITOT 0.6 0.9 0.5 0.4  AST 27 20 24 17  ALT 31 20 23 21  ALKPHOS 234* 270* 660* 788*  PROT 5.6* 5.9* 6.0* 5.8*  ALBUMIN 1.7* 1.7* 1.7* 1.8*    Assessment and Plan:  Hepatic abscesses s/p hepatic drain placement x2 in IR 11/06/2020: Most recent labs (CBC; CMP) are unremarkable with the exception of Alkaline phosphatase level of 660.  Drain 1 (superior) with 90 ml output documented in the past 24 hours. Another 20 ml of light, thin bile in bulb.   Drain 2 (inferior) with 5 ml output documented in the past 24 hours. Another 15 ml of light, thin serous fluid in bulb.   IR recommends to flush each drain once daily and document the output each shift. Change the dressings daily or as needed. Patient was discharged to Inpatient Rehab 11/12/20. No drain care orders were in place. New drain care orders have now been placed.   Further plans per primary teams. IR will continue to follow.   Electronically Signed:  , AGACNP-BC [phone] 11/19/2020, 11:58 AM   I spent a total of 15 Minutes at the the patient's bedside AND on the patient's hospital floor or unit, greater than 50% of which was counseling/coordinating care for RUQ abscess drains x2      

## 2020-11-20 DIAGNOSIS — K750 Abscess of liver: Secondary | ICD-10-CM

## 2020-11-20 DIAGNOSIS — N320 Bladder-neck obstruction: Secondary | ICD-10-CM

## 2020-11-20 DIAGNOSIS — G929 Unspecified toxic encephalopathy: Secondary | ICD-10-CM

## 2020-11-20 LAB — GLUCOSE, CAPILLARY
Glucose-Capillary: 103 mg/dL — ABNORMAL HIGH (ref 70–99)
Glucose-Capillary: 61 mg/dL — ABNORMAL LOW (ref 70–99)
Glucose-Capillary: 143 mg/dL — ABNORMAL HIGH (ref 70–99)
Glucose-Capillary: 141 mg/dL — ABNORMAL HIGH (ref 70–99)
Glucose-Capillary: 179 mg/dL — ABNORMAL HIGH (ref 70–99)

## 2020-11-20 LAB — ACID FAST SMEAR (AFB, MYCOBACTERIA): Acid Fast Smear: NEGATIVE

## 2020-11-20 NOTE — Progress Notes (Signed)
Speech Language Pathology Weekly Progress and Session Note  Patient Details  Name: Colton Bush MRN: [id number] Date of Birth: 31-Aug-1955  Beginning of progress report period: November 13, 2020 End of progress report period: November 20, 2020  Today's Date: 11/20/2020 SLP Individual Time: 1001-1031 SLP Individual Time Calculation (min): 30 min  Short Term Goals: Week 1: SLP Short Term Goal 1 (Week 1): Pt will demonstrate anticipatory awareness for home with mod A verbal cues. SLP Short Term Goal 1 - Progress (Week 1): Met SLP Short Term Goal 2 (Week 1): Pt will recall 2 events from previous therapy sessions with min A verbal cues. SLP Short Term Goal 2 - Progress (Week 1): Met SLP Short Term Goal 3 (Week 1): Pt will selectively attend to functional tasks with Min A verbal cues. SLP Short Term Goal 3 - Progress (Week 1): Met SLP Short Term Goal 4 (Week 1): Pt will complete mildly complex problem solving tasks with min A verbal cues. SLP Short Term Goal 4 - Progress (Week 1): Met    New Short Term Goals: Week 2: SLP Short Term Goal 1 (Week 2): Pt will complete moderately complex problem solving tasks with min A verbal cues SLP Short Term Goal 2 (Week 2): Pt will increase short term recall of novel/functional information with Supervision A cues SLP Short Term Goal 3 (Week 2): Pt will increase judgement/safety for home management tasks and ADLs with min A cues to recall current physical impairments  Weekly Progress Updates: Pt has made functional and consistent gains and has met 4 out of 4 short term goals this reporting period due to improved sustained attention, recall of functional information and problem solving.   Currently, pt continues to require min A for awareness of errors, mildly complex problem solving, and reasoning with less familiar tasks. Pt/family education is ongoing. Pt would benefit from continued skilled SLP intervention to maximize functional independence for safe discharge  home.    Intensity: Minumum of 1-2 x/day, 30 to 90 minutes Frequency: 3 to 5 out of 7 days Duration/Length of Stay: 11/30/20 Treatment/Interventions: Cognitive remediation/compensation;Speech/Language facilitation;Patient/family education   Daily Session  Skilled Therapeutic Interventions: Pt seen for skilled ST with focus on cognitive goals. Pt initially lethargic but became very verbose when discussing topics of interest (collecting coins, collecting [employer] products, vinyl records and the [employer] show). SLP facilitating moderately complex problem solving task related to home management (pt wants to mow the lawn but wife won't allow him) by providing mod A verbal cues to increase judgement/awareness of current impairments. Pt receptive to education re: likely unable to perform high level physical tasks at d/c but finding safe choices to replace those tasks to maintain independence and feelings of helping around the home. Pt left in bed with alarm set and all needs within reach. Cont ST POC.      General    Pain Pain Assessment Pain Scale: 0-10 Pain Score: 0-No pain  Therapy/Group: Individual Therapy  Dewaine Conger 11/20/2020, 10:49 AM

## 2020-11-20 NOTE — Progress Notes (Signed)
Referring Physician(s): Dr. Cyd Silence  Supervising Physician: Ruthann Cancer  Patient Status:  [facility] - In-pt  Chief Complaint: Hepatic abscess drain x2 placed 11/06/20  Subjective: Patient in room in rehab.  Sleeping, but awakens easily.  Appears tired.  States he is hoping his son with visit today.  Drains remain in place.   Allergies: Patient has no known allergies.  Medications: Prior to Admission medications   Medication Sig Start Date End Date Taking? Authorizing Provider  cefTRIAXone (ROCEPHIN) IVPB Inject 2 g into the vein every 12 (twelve) hours. Indication:  liver abscess and possible infected subdural hematoma First Dose: No Last Day of Therapy:  12/21/20 Labs - Once weekly:  CBC/D and BMP, Labs - Every other week:  ESR and CRP Method of administration: IV Push Method of administration may be changed at the discretion of home infusion pharmacist based upon assessment of the patient and/or caregiver's ability to self-administer the medication ordered. 11/12/20   Debbe Odea, MD  CREON 6000-19000 units CPEP Take 1 capsule by mouth with breakfast, with lunch, and with evening meal.  04/15/20   [provider]  enoxaparin (LOVENOX) 40 MG/0.4ML injection Inject 0.4 mLs (40 mg total) into the skin daily. 11/13/20   Debbe Odea, MD  ferrous sulfate 325 (65 FE) MG tablet Take 325 mg daily with breakfast by mouth.    [provider]  insulin detemir (LEVEMIR) 100 UNIT/ML injection Inject 0.1 mLs (10 Units total) into the skin 2 (two) times daily. 11/12/20   Debbe Odea, MD  levETIRAcetam (KEPPRA) 1000 MG tablet Take 1 tablet (1,000 mg total) by mouth 2 (two) times daily. 11/12/20   Debbe Odea, MD  loratadine (CLARITIN) 10 MG tablet Take 10 mg by mouth daily.    [provider]  metroNIDAZOLE (FLAGYL) 500 MG tablet Take 1 tablet (500 mg total) by mouth 3 (three) times daily. Treat through 12/21/20 11/12/20   Debbe Odea, MD  Multiple Vitamin  (MULTIVITAMIN WITH MINERALS) TABS tablet Take 1 tablet by mouth daily.    [provider]  NOVOLOG FLEXPEN 100 UNIT/ML FlexPen GIVE EVERY MORNING WITH BREAKFAST AND EVERY EVENING WITH SUPPER PER SLIDING SCALE Patient taking differently: Inject 2-13 Units into the skin 2 (two) times daily. Sliding Scale : 101-150 - 2u 151-200 - 3u 201-250 - 5u 251-300 - 7u 301-350 - 9u 351- 400 - 11u 401-450 - 13u Greater than 150, alert MD 04/17/17   [name], MD  Omega-3 Fatty Acids (FISH OIL) 500 MG CAPS Take 500 mg by mouth daily.    [provider]  omeprazole (PRILOSEC) 20 MG capsule TAKE 1 CAPSULE DAILY 11/17/20   [name], MD  polyethylene glycol (MIRALAX / GLYCOLAX) 17 g packet Take 17 g by mouth daily as needed for mild constipation. 11/12/20   Debbe Odea, MD  rosuvastatin (CRESTOR) 20 MG tablet Take 1 tablet (20 mg total) by mouth daily. 07/22/20   [name], MD  tamsulosin (FLOMAX) 0.4 MG CAPS capsule Take 1 capsule (0.4 mg total) by mouth daily. 10/29/20   [place] Donnamarie Poag, DO     Vital Signs: BP (!) 104/53   Pulse 86   Temp 98.5 F (36.9 C) (Oral)   Resp 18   Ht _0  (1.803 m)   Wt 182 lb 15.7 oz (83 kg)   SpO2 92%   BMI 25.52 kg/m   Physical Exam  NAD, sleepy but awakens. Abdomen: RUQ drains to suction x2. Insertion sites found  intact today. Dressing clean and dry. Sup drain with dark, bilious output. Inferior drain with serosanginuous fluid.   Imaging: DG Chest 2 View  Result Date: 11/18/2020 CLINICAL DATA:  Chest pain. EXAM: CHEST - 2 VIEW COMPARISON:  11/08/2020. FINDINGS: Borderline cardiomegaly. Diffuse right lung infiltrate. Moderate right pleural effusion. Mild atelectasis left lung base. No pneumothorax. Prior cervical spine fusion. IMPRESSION: 1.  Diffuse right lung infiltrate.  Moderate right pleural effusion. 2.  Mild left base subsegmental atelectasis. 3.  Borderline cardiomegaly. Electronically Signed   By: Marcello Moores  Register   On:  11/18/2020 15:18   DG Lumbar Spine 2-3 Views  Result Date: 11/16/2020 CLINICAL DATA:  Pain right flank EXAM: LUMBAR SPINE - 2-3 VIEW COMPARISON:  CT abdomen pelvis 08/31/2020 FINDINGS: Mild retrolisthesis L2-3 and L3-4. Diffuse disc degeneration with disc space narrowing and spurring throughout the lumbar spine. Mild lumbar kyphosis Negative for fracture or mass Pigtail drainage catheter right upper quadrant. Bowel staples in the region of the stomach. IMPRESSION: Multilevel lumbar degenerative change.  No acute abnormality. Electronically Signed   By: Franchot Gallo M.D.   On: 11/16/2020 16:45   DG HIP UNILAT WITH PELVIS 2-3 VIEWS RIGHT  Result Date: 11/16/2020 CLINICAL DATA:  Follow-up hip fracture EXAM: DG HIP (WITH OR WITHOUT PELVIS) 3V RIGHT COMPARISON:  10/25/2020 FINDINGS: Medullary rod and proximal fixation screw are again seen and stable. Changes of prior proximal right femoral fracture are seen with fracture fragments in near anatomic alignment. No new focal abnormality is noted. IMPRESSION: Status post ORIF of proximal right femoral fracture. Electronically Signed   By: Inez Catalina M.D.   On: 11/16/2020 21:16    Labs:  CBC: Recent Labs    11/10/20 0705 11/10/20 0829 11/13/20 0447 11/15/20 0519  WBC 9.6 11.0* 10.5 7.9  HGB 10.0* 10.9* 10.0* 9.9*  HCT 30.6* 33.5* 30.6* 30.7*  PLT 415* 469* 404* 470*    COAGS: Recent Labs    10/19/20 0259 10/24/20 0814 11/05/20 1930 11/06/20 0337  INR 1.4* 1.2 1.5* 1.6*  APTT  --   --  32 36    BMP: Recent Labs    04/14/20 0925 07/09/20 0825 11/13/20 0447 11/15/20 0519 11/16/20 0525 11/17/20 0524  NA 141   < > 135 139 137 135  K 3.3*   < > 3.4* 3.2* 3.1* 3.6  CL 107   < > 102 105 103 102  CO2 27   < > _0 GLUCOSE 73   < > 196* 215* 97 107*  BUN 9   < > 6* 8 7* 6*  CALCIUM 8.8*   < > 7.7* 8.1* 7.9* 8.1*  CREATININE 0.71   < > 0.44* 0.48* 0.42* 0.44*  GFRNONAA >60   < > >60 >60 >60 >60  GFRAA >60  --   --   --    --   --    < > = values in this interval not displayed.    LIVER FUNCTION TESTS: Recent Labs    11/09/20 0132 11/13/20 0447 11/15/20 0519 11/17/20 0524  BILITOT 0.6 0.9 0.5 0.4  AST _1 ALT _2 ALKPHOS 234* 270* 660* 788*  PROT 5.6* 5.9* 6.0* 5.8*  ALBUMIN 1.7* 1.7* 1.7* 1.8*    Assessment and Plan: Hepatic abscesses s/p hepatic drain placement x2 in IR 11/06/2020:  Hepatic drain remain in place.  Drain 1 (superior) with 30 ml output documented in the past 24 hours.  Drain 2 (inferior) with 80 ml output documented in the past 24 hours.   IR recommends to flush each drain once daily and document the output each shift. Change the dressings daily or as needed.    Further plans per primary teams. IR will continue to follow.   Electronically Signed: Brynda Greathouse, MS RD PA-C   I spent a total of 15 Minutes at the the patient's bedside AND on the patient's hospital floor or unit, greater than 50% of which was counseling/coordinating care for RUQ abscess drains x2

## 2020-11-20 NOTE — Progress Notes (Signed)
PROGRESS NOTE   Subjective/Complaints: Pt without specific complaints. Asked if he needed a special permit to pass through metal detectors now that he has a rod in his leg.  ROS: Patient denies fever, rash, sore throat, blurred vision, nausea, vomiting, diarrhea, cough, shortness of breath or chest pain, joint or back pain, headache, or mood change.   Objective:   DG Chest 1 View  Result Date: 11/19/2020 CLINICAL DATA:  Status post thoracentesis. EXAM: CHEST  1 VIEW COMPARISON:  11/18/2020 FINDINGS: Near complete resolution of the right-sided pleural effusion. No evidence for right pneumothorax. Atelectasis noted right lung base. Minimal subsegmental atelectasis or linear scarring noted left lower lung. The cardiopericardial silhouette is within normal limits for size. The visualized bony structures of the thorax show no acute abnormality. Percutaneous drainage catheters overlie the right upper quadrant of the abdomen. IMPRESSION: Near complete resolution of right pleural effusion status post thoracentesis. No pneumothorax. Electronically Signed   By: Misty Stanley M.D.   On: 11/19/2020 12:14   DG Chest 2 View  Result Date: 11/18/2020 CLINICAL DATA:  Chest pain. EXAM: CHEST - 2 VIEW COMPARISON:  11/08/2020. FINDINGS: Borderline cardiomegaly. Diffuse right lung infiltrate. Moderate right pleural effusion. Mild atelectasis left lung base. No pneumothorax. Prior cervical spine fusion. IMPRESSION: 1.  Diffuse right lung infiltrate.  Moderate right pleural effusion. 2.  Mild left base subsegmental atelectasis. 3.  Borderline cardiomegaly. Electronically Signed   By: Marcello Moores  Register   On: 11/18/2020 15:18   IR THORACENTESIS ASP PLEURAL SPACE W/IMG GUIDE  Result Date: 11/19/2020 INDICATION: Patient with a history of pancreatic cancer presents today with pleural effusions. Interventional radiology asked to perform a therapeutic and diagnostic  thoracentesis. EXAM: ULTRASOUND GUIDED THORACENTESIS MEDICATIONS: 1% lidocaine 10 mL COMPLICATIONS: None immediate. PROCEDURE: An ultrasound guided thoracentesis was thoroughly discussed with the patient and questions answered. The benefits, risks, alternatives and complications were also discussed. The patient understands and wishes to proceed with the procedure. Written consent was obtained. Ultrasound was performed to localize and mark an adequate pocket of fluid in the right chest. The area was then prepped and draped in the normal sterile fashion. 1% Lidocaine was used for local anesthesia. Under ultrasound guidance a 6 Fr Safe-T-Centesis catheter was introduced. Thoracentesis was performed. The catheter was removed and a dressing applied. FINDINGS: A total of approximately 900 mL of amber-colored fluid was removed. Samples were sent to the laboratory as requested by the clinical team. IMPRESSION: Successful ultrasound guided right thoracentesis yielding 900 mL of pleural fluid. Read by: Soyla Dryer, NP Electronically Signed   By: Ruthann Cancer MD   On: 11/19/2020 12:28   No results for input(s): WBC, HGB, HCT, PLT in the last 72 hours. No results for input(s): NA, K, CL, CO2, GLUCOSE, BUN, CREATININE, CALCIUM in the last 72 hours.  Intake/Output Summary (Last 24 hours) at 11/20/2020 0919 Last data filed at 11/20/2020 0759 Gross per 24 hour  Intake 790 ml  Output 1760 ml  Net -970 ml     Pressure Injury 11/07/20 Heel Right Stage 1 -  Intact skin with non-blanchable redness of a localized area usually over a bony prominence. (Active)  11/07/20 0800  Location: Heel  Location Orientation: Right  Staging: Stage 1 -  Intact skin with non-blanchable redness of a localized area usually over a bony prominence.  Wound Description (Comments):   Present on Admission:      Pressure Injury 11/07/20 Sacrum Stage 2 -  Partial thickness loss of dermis presenting as a shallow open injury with a red, pink  wound bed without slough. 4/15 skin is now broken; was documented as stage 1. now a stage 2. (Active)  11/07/20 2000  Location: Sacrum  Location Orientation:   Staging: Stage 2 -  Partial thickness loss of dermis presenting as a shallow open injury with a red, pink wound bed without slough.  Wound Description (Comments): 4/15 skin is now broken; was documented as stage 1. now a stage 2.  Present on Admission:      Pressure Injury 11/07/20 Heel Right Deep Tissue Pressure Injury - Purple or maroon localized area of discolored intact skin or blood-filled blister due to damage of underlying soft tissue from pressure and/or shear. (Active)  11/07/20 2000  Location: Heel  Location Orientation: Right  Staging: Deep Tissue Pressure Injury - Purple or maroon localized area of discolored intact skin or blood-filled blister due to damage of underlying soft tissue from pressure and/or shear.  Wound Description (Comments):   Present on Admission:     Physical Exam: Vital Signs Blood pressure 113/63, pulse 90, temperature 98.7 F (37.1 C), resp. rate 18, height _0  (1.803 m), weight 83 kg, SpO2 93 %.  Constitutional: No distress . Vital signs reviewed. HEENT: EOMI, oral membranes moist Neck: supple Cardiovascular: RRR without murmur. No JVD    Respiratory/Chest: CTA Bilaterally without wheezes or rales. Normal effort    GI/Abdomen: BS +, old incision, drains in place with blood tinged/bilious drainage Ext: no clubbing, cyanosis, or edema Psych: flat Genitourinary: Penis: Normal.  Comments: Light amber urine in foley as it was placed under sterile conditions Musculoskeletal:  Cervical back: Normal range of motionand neck supple.  Comments: RLE with multiple small stapled incisions.   UE's 5-/5 but equal B/L  LE's 4+/5 in HF, KE, DF and PF B/L  Skin: General: Skin is warmand dry.  Comments: RLE incisions as well as abd incisions already noted Neurological:   Mental Status: He is oriented to person, place, and time.  Comments: Soft voice with mild left facial weakness. Slow to respond but able to follow basic motor commands.   Delayed but reasonable processing and speech today Intact to light touch in all 4 extremities     Assessment/Plan: 1. Functional deficits which require 3+ hours per day of interdisciplinary therapy in a comprehensive inpatient rehab setting.  Physiatrist is providing close team supervision and 24 hour management of active medical problems listed below.  Physiatrist and rehab team continue to assess barriers to discharge/monitor patient progress toward functional and medical goals  Care Tool:  Bathing    Body parts bathed by patient: Right arm,Left arm,Chest,Abdomen,Face   Body parts bathed by helper: Front perineal area,Buttocks,Right upper leg,Left upper leg,Right lower leg,Left lower leg     Bathing assist Assist Level: Maximal Assistance - Patient 24 - 49%     Upper Body Dressing/Undressing Upper body dressing   What is the patient wearing?: Pull over shirt    Upper body assist Assist Level: Moderate Assistance - Patient 50 - 74%    Lower Body Dressing/Undressing Lower body dressing      What is the patient wearing?: Pants  Lower body assist Assist for lower body dressing: Moderate Assistance - Patient 50 - 74%     Toileting Toileting    Toileting assist Assist for toileting: Dependent - Patient 0%     Transfers Chair/bed transfer  Transfers assist     Chair/bed transfer assist level: Dependent - mechanical lift     Locomotion Ambulation   Ambulation assist   Ambulation activity did not occur: Safety/medical concerns          Walk 10 feet activity   Assist  Walk 10 feet activity did not occur: Safety/medical concerns        Walk 50 feet activity   Assist Walk 50 feet with 2 turns activity did not occur: Safety/medical concerns         Walk 150  feet activity   Assist Walk 150 feet activity did not occur: Safety/medical concerns         Walk 10 feet on uneven surface  activity   Assist Walk 10 feet on uneven surfaces activity did not occur: Safety/medical concerns         Wheelchair     Assist Will patient use wheelchair at discharge?: Yes Type of Wheelchair: Manual Wheelchair activity did not occur: Safety/medical concerns  Wheelchair assist level: Supervision/Verbal cueing Max wheelchair distance: 100'    Wheelchair 50 feet with 2 turns activity    Assist    Wheelchair 50 feet with 2 turns activity did not occur: Safety/medical concerns   Assist Level: Supervision/Verbal cueing   Wheelchair 150 feet activity     Assist  Wheelchair 150 feet activity did not occur: Safety/medical concerns       Blood pressure 113/63, pulse 90, temperature 98.7 F (37.1 C), resp. rate 18, height _0  (1.803 m), weight 83 kg, SpO2 93 %.  Medical Problem List and Plan: 1.L frontal craniotomysecondary to LDH/SAH from fall -patient may Shower if incisions covered -ELOS/Goals: 2-3 weeks- mod I to supervision             Continue CIR 2. Impaired mobility: -DVT/anticoagulation: Pharmaceutical: Continue Lovenox -antiplatelet therapy: N/a 3. Low back pain: continue air mattress. Resolved. Dicussed XR findings of degeneration.  4. Mood: LCSW to follow for evaluation and support.  -antipsychotic agents: N/A 5. Neuropsych: This patient is capable of making decisions on his own behalf. 6. Multiple deep tissue injuries/Skin/Wound Care: Continue Air mattress overlay with pressure relief measures.  --Continue vitamins and collagen to promote wound healing  7. Fluids/Electrolytes/Nutrition: Monitor I/O. Check lytes in am. 8. SDH (concerns of bacterial infection): S/p Burr hole evacuation  9. Strep anginosis bacteremia: Continue  Ceftriaxone/Metronidazole X 6 weeks w/end date  10 Liver abscess s/p JP drain placement: Continue flushes TID and document output.   -IR is following. 11. New onset seizures: Continue Keppra bid till follow up with neuro on outpatient basis.  12. Left hip ORIF: WBAT. Will consult ortho to eval given worsening pain. Added MS contin for better pain control/participation with therapy. Appears to have helped, continue.  13. T2DM: Hgb A1C- 10.8. Will monitor BS ac/hs. Home regimen Levemir 10u am/15u pm with SSI.  --continue Levemir 13u bid with SSI and titrate insulin as indicated.             -4/23 improved control 14. BPH/Urinary retention: Foley replaced--keep in place for decompression for few days then start bladder program next week?.  --will increase flomax to 0.8 mg/HS  15. Pancreatic cancer with recurrence: Continue Creon with meals. --follow up with  Hem/onc and Radiation Onc after discharge.  -wife asking about plans for radiation/chemo: will contact oncology to ask them to discuss with wife.  16. Low back pain/muscle spasms- will start Robaxin prn and lidoderm patches 1- 8pm to 8am.              4/16: denies pain: decrease robaxin to q12H prn.  17. Hypokalemia: scheduled supplement and repeat BMP Monday 18. Overweight BMI 25.52: provide counseling.  19. Elevated alk phos: increasing,  Oncology notified. Oncology has discussed plans with wife for post- CIR follow-up.  20. Right sided pleural effusion: could be contributing to his chest pain. Consulted IR regarding drainage/pleural studies given active cancer. ADDENDUM: s/p successful thoracentesis  -pt denies any SOB    LOS: 8 days A FACE TO FACE EVALUATION WAS PERFORMED  Meredith Staggers 11/20/2020, 9:19 AM

## 2020-11-21 LAB — BODY FLUID CULTURE W GRAM STAIN

## 2020-11-21 LAB — GLUCOSE, CAPILLARY
Glucose-Capillary: 104 mg/dL — ABNORMAL HIGH (ref 70–99)
Glucose-Capillary: 174 mg/dL — ABNORMAL HIGH (ref 70–99)
Glucose-Capillary: 124 mg/dL — ABNORMAL HIGH (ref 70–99)
Glucose-Capillary: 177 mg/dL — ABNORMAL HIGH (ref 70–99)

## 2020-11-21 MED ORDER — LINEZOLID 600 MG/300ML IV SOLN
600.0000 mg | Freq: Two times a day (BID) | INTRAVENOUS | Status: DC
  Administered 2020-11-21 – 2020-11-22 (×2): 600 mg via INTRAVENOUS
  Filled 2020-11-21 (×2): qty 300

## 2020-11-21 NOTE — Plan of Care (Signed)
  Problem: RH Balance Goal: LTG Patient will maintain dynamic sitting balance (PT) Description: LTG:  Patient will maintain dynamic sitting balance with assistance during mobility activities (PT) Flowsheets (Taken 11/21/2020 0754) LTG: Pt will maintain dynamic sitting balance during mobility activities with:: (downgrade due to slow progress) Minimal Assistance - Patient > 75% Note: downgrade due to slow progress Goal: LTG Patient will maintain dynamic standing balance (PT) Description: LTG:  Patient will maintain dynamic standing balance with assistance during mobility activities (PT) Flowsheets (Taken 11/21/2020 0754) LTG: Pt will maintain dynamic standing balance during mobility activities with:: (downgrade due to slow progress) Minimal Assistance - Patient > 75% Note: downgrade due to slow progress   Problem: Sit to Stand Goal: LTG:  Patient will perform sit to stand with assistance level (PT) Description: LTG:  Patient will perform sit to stand with assistance level (PT) Flowsheets (Taken 11/21/2020 0754) LTG: PT will perform sit to stand in preparation for functional mobility with assistance level: (downgrade due to slow progress) Minimal Assistance - Patient > 75% Note: downgrade due to slow progress   Problem: RH Bed to Chair Transfers Goal: LTG Patient will perform bed/chair transfers w/assist (PT) Description: LTG: Patient will perform bed to chair transfers with assistance (PT). Flowsheets (Taken 11/21/2020 0754) LTG: Pt will perform Bed to Chair Transfers with assistance level: (downgrade due to slow progress) Minimal Assistance - Patient > 75% Note: downgrade due to slow progress   Problem: RH Car Transfers Goal: LTG Patient will perform car transfers with assist (PT) Description: LTG: Patient will perform car transfers with assistance (PT). Flowsheets (Taken 11/21/2020 0754) LTG: Pt will perform car transfers with assist:: (downgrade due to slow progress) Minimal Assistance -  Patient > 75% Note: downgrade due to slow progress   Problem: RH Ambulation Goal: LTG Patient will ambulate in controlled environment (PT) Description: LTG: Patient will ambulate in a controlled environment, # of feet with assistance (PT). Flowsheets (Taken 11/21/2020 0754) LTG: Pt will ambulate in controlled environ  assist needed:: (downgrade due to slow progress) Minimal Assistance - Patient > 75% LTG: Ambulation distance in controlled environment: 50 ft with LRAD Note: downgrade due to slow progress Goal: LTG Patient will ambulate in home environment (PT) Description: LTG: Patient will ambulate in home environment, # of feet with assistance (PT). Flowsheets (Taken 11/21/2020 0754) LTG: Pt will ambulate in home environ  assist needed:: (downgrade due to slow progress) Minimal Assistance - Patient > 75% LTG: Ambulation distance in home environment: 25 ft wtih LRAD Note: downgrade due to slow progress

## 2020-11-21 NOTE — Progress Notes (Signed)
Physical Therapy Weekly Progress Note  Patient Details  Name: Colton Bush MRN: [id number] Date of Birth: 26-Aug-1955  Beginning of progress report period: November 13, 2020 End of progress report period: November 21, 2020  Today's Date: 11/21/2020 PT Individual Time: 1000-1100 PT Individual Time Calculation (min): 60 min   Patient has met 1 of 4 short term goals.  Pt is making very slow progress towards therapy goals. Pt remains very passive in his care and self-limiting at times. Pt is min to mod A for bed mobility with use of hospital bed features, max A to +2 to stand with use of stedy, Supervision for w/c mobility up to 150 ft with use of BUE. Pt remains limited in his ability to stand and initiate gait training due to ongoing pain in his RLE.  Patient continues to demonstrate the following deficits muscle weakness and muscle joint tightness, decreased cardiorespiratoy endurance, decreased attention, decreased awareness, decreased problem solving, decreased safety awareness and decreased memory and decreased standing balance, decreased postural control and decreased balance strategies and therefore will continue to benefit from skilled PT intervention to increase functional independence with mobility.  Patient not progressing toward long term goals.  See goal revision..  Plan of care revisions: downgraded goals from Supervision and CGA level to min A level as well as decreased gait distance due to patient making very slow progress towards therapy goals.  PT Short Term Goals Week 1:  PT Short Term Goal 1 (Week 1): pt to demonstrate supine<>sit min A PT Short Term Goal 1 - Progress (Week 1): Progressing toward goal PT Short Term Goal 2 (Week 1): pt to demonstrate bed<>chair transfers mod A with LRAD PT Short Term Goal 2 - Progress (Week 1): Progressing toward goal PT Short Term Goal 3 (Week 1): pt to demonstrate WC mobility 75 min A PT Short Term Goal 3 - Progress (Week 1): Met PT Short Term  Goal 4 (Week 1): to initiate gait PT Short Term Goal 4 - Progress (Week 1): Not met Week 2:  PT Short Term Goal 1 (Week 2): Pt will perform least restrictive transfer with mod A consistently PT Short Term Goal 2 (Week 2): Pt will initiate gait training PT Short Term Goal 3 (Week 2): Pt will perform bed mobility with min A consistently  Skilled Therapeutic Interventions/Progress Updates:    Pt received seated in bed, agreeable to PT session. No complaints of pain at rest, has onset of R knee pain with mobility. Pt able to receive pain medication during session. Education with patient regarding being proactive in his care and requesting pain medication prior to therapy sessions as he knows he will have pain with mobility and will be better able to participate if his pain is controlled. Supine to sit with mod A needed for LE management this date. Pt requires increased assist for bed mobility this date, use of bedrails and HOB elevated. Sit to stand with max A to stedy from elevated bed. Pt found to be incontinent of bowel. Pt initially reports being unaware he has had a BM, then reports he feels he may have had one a few hours ago and did not alert staff. Education again with patient about being proactive in care and alerting staff when he has urge to have a BM and/or letting staff know if he has been incontinent. Rolling L/R with min A with skilled cueing for dependent clothing change, brief change, and pericare. Assisted pt with donning new pants (total A) and  new shirt (mod A) while seated in bed. Pt left semi-reclined in bed with needs in reach at end of session.  Therapy Documentation Precautions:  Restrictions Weight Bearing Restrictions: No RLE Weight Bearing: Weight bearing as tolerated  Therapy/Group: Individual Therapy   Excell Seltzer, PT, DPT, CSRS  11/21/2020, 12:02 PM

## 2020-11-21 NOTE — Progress Notes (Signed)
Speech Language Pathology Daily Session Note  Patient Details  Name: Colton Bush MRN: [id number] Date of Birth: [birth year]  Today's Date: 11/21/2020 SLP Individual Time: 0850-0930 SLP Individual Time Calculation (min): 40 min  Short Term Goals: Week 2: SLP Short Term Goal 1 (Week 2): Pt will complete moderately complex problem solving tasks with min A verbal cues SLP Short Term Goal 2 (Week 2): Pt will increase short term recall of novel/functional information with Supervision A cues SLP Short Term Goal 3 (Week 2): Pt will increase judgement/safety for home management tasks and ADLs with min A cues to recall current physical impairments  Skilled Therapeutic Interventions:  Pt was seen for skilled ST targeting cognitive goals.  Pt was awake and alert upon therapist's arrival, agreeable to participating in treatment.  SLP facilitated the session with min verbal cues to recall activities from previous therapy sessions.  Pt also needed mod assist verbal cues to identify ways in which he could safely maximize his functional independence at his current level of care (directing care, maximizing treatment time by planning ahead to be as prepared as possible for therapy appointments, etc).  Pt often verbalized frustration with various social situations in his life and felt like they were impacting his ability to progress in treatment.  SLP provided encouragement and validation of pt's feelings but also attempted to redirect pt to focus on the daily rehab process.  Pt was left in bed with bed alarm set and call bell within reach.  Continue per current plan of care.    Pain Pain Assessment Pain Scale: 0-10 Pain Score: 0-No pain  Therapy/Group: Individual Therapy  Akshara Blumenthal, Selinda Orion 11/21/2020, 10:39 AM

## 2020-11-22 ENCOUNTER — Ambulatory Visit: Payer: Medicare Other | Admitting: Radiation Oncology

## 2020-11-22 LAB — CBC WITH DIFFERENTIAL/PLATELET
Abs Immature Granulocytes: 0.04 10*3/uL (ref 0.00–0.07)
Basophils Absolute: 0.1 10*3/uL (ref 0.0–0.1)
Basophils Relative: 1 %
Eosinophils Absolute: 0.1 10*3/uL (ref 0.0–0.5)
Eosinophils Relative: 1 %
HCT: 30.4 % — ABNORMAL LOW (ref 39.0–52.0)
Hemoglobin: 9.7 g/dL — ABNORMAL LOW (ref 13.0–17.0)
Immature Granulocytes: 0 %
Lymphocytes Relative: 8 %
Lymphs Abs: 0.9 10*3/uL (ref 0.7–4.0)
MCH: 29.1 pg (ref 26.0–34.0)
MCHC: 31.9 g/dL (ref 30.0–36.0)
MCV: 91.3 fL (ref 80.0–100.0)
Monocytes Absolute: 1 10*3/uL (ref 0.1–1.0)
Monocytes Relative: 9 %
Neutro Abs: 8.5 10*3/uL — ABNORMAL HIGH (ref 1.7–7.7)
Neutrophils Relative %: 81 %
Platelets: 489 10*3/uL — ABNORMAL HIGH (ref 150–400)
RBC: 3.33 MIL/uL — ABNORMAL LOW (ref 4.22–5.81)
RDW: 15.9 % — ABNORMAL HIGH (ref 11.5–15.5)
WBC: 10.6 10*3/uL — ABNORMAL HIGH (ref 4.0–10.5)
nRBC: 0 % (ref 0.0–0.2)

## 2020-11-22 LAB — COMPREHENSIVE METABOLIC PANEL
ALT: 10 U/L (ref 0–44)
AST: 12 U/L — ABNORMAL LOW (ref 15–41)
Albumin: 1.9 g/dL — ABNORMAL LOW (ref 3.5–5.0)
Alkaline Phosphatase: 354 U/L — ABNORMAL HIGH (ref 38–126)
Anion gap: 7 (ref 5–15)
BUN: 6 mg/dL — ABNORMAL LOW (ref 8–23)
CO2: 27 mmol/L (ref 22–32)
Calcium: 8.3 mg/dL — ABNORMAL LOW (ref 8.9–10.3)
Chloride: 100 mmol/L (ref 98–111)
Creatinine, Ser: 0.42 mg/dL — ABNORMAL LOW (ref 0.61–1.24)
GFR, Estimated: >60 mL/min (ref >60)
Glucose, Bld: 66 mg/dL — ABNORMAL LOW (ref 70–99)
Potassium: 3.7 mmol/L (ref 3.5–5.1)
Sodium: 134 mmol/L — ABNORMAL LOW (ref 135–145)
Total Bilirubin: 0.7 mg/dL (ref 0.3–1.2)
Total Protein: 6.3 g/dL — ABNORMAL LOW (ref 6.5–8.1)

## 2020-11-22 LAB — GLUCOSE, CAPILLARY
Glucose-Capillary: 68 mg/dL — ABNORMAL LOW (ref 70–99)
Glucose-Capillary: 105 mg/dL — ABNORMAL HIGH (ref 70–99)
Glucose-Capillary: 256 mg/dL — ABNORMAL HIGH (ref 70–99)
Glucose-Capillary: 215 mg/dL — ABNORMAL HIGH (ref 70–99)
Glucose-Capillary: 163 mg/dL — ABNORMAL HIGH (ref 70–99)

## 2020-11-22 LAB — CYTOLOGY - NON PAP

## 2020-11-22 MED ORDER — LINEZOLID 600 MG PO TABS
600.0000 mg | ORAL_TABLET | Freq: Two times a day (BID) | ORAL | Status: AC
  Administered 2020-11-22 – 2020-12-05 (×27): 600 mg via ORAL
  Filled 2020-11-22 (×28): qty 1

## 2020-11-22 NOTE — Progress Notes (Signed)
PROGRESS NOTE   Subjective:  Pt reports R knee- worst pain- "wouldn't believe"- intermittent- Also L eye red due to "dust in his eye".    ROS:  Pt denies SOB, abd pain, CP, N/V/C/D, and vision changes  Objective:   No results found. Recent Labs    11/22/20 0503  WBC 10.6*  HGB 9.7*  HCT 30.4*  PLT 489*   Recent Labs    11/22/20 0503  NA 134*  K 3.7  CL 100  CO2 27  GLUCOSE 66*  BUN 6*  CREATININE 0.42*  CALCIUM 8.3*    Intake/Output Summary (Last 24 hours) at 11/22/2020 1840 Last data filed at 11/22/2020 1505 Gross per 24 hour  Intake 940 ml  Output 1500 ml  Net -560 ml     Pressure Injury 11/07/20 Heel Right Stage 1 -  Intact skin with non-blanchable redness of a localized area usually over a bony prominence. (Active)  11/07/20 0800  Location: Heel  Location Orientation: Right  Staging: Stage 1 -  Intact skin with non-blanchable redness of a localized area usually over a bony prominence.  Wound Description (Comments):   Present on Admission:      Pressure Injury 11/07/20 Sacrum Stage 2 -  Partial thickness loss of dermis presenting as a shallow open injury with a red, pink wound bed without slough. 4/15 skin is now broken; was documented as stage 1. now a stage 2. (Active)  11/07/20 2000  Location: Sacrum  Location Orientation:   Staging: Stage 2 -  Partial thickness loss of dermis presenting as a shallow open injury with a red, pink wound bed without slough.  Wound Description (Comments): 4/15 skin is now broken; was documented as stage 1. now a stage 2.  Present on Admission:      Pressure Injury 11/07/20 Heel Right Deep Tissue Pressure Injury - Purple or maroon localized area of discolored intact skin or blood-filled blister due to damage of underlying soft tissue from pressure and/or shear. (Active)  11/07/20 2000  Location: Heel  Location Orientation: Right  Staging: Deep Tissue Pressure Injury  - Purple or maroon localized area of discolored intact skin or blood-filled blister due to damage of underlying soft tissue from pressure and/or shear.  Wound Description (Comments):   Present on Admission:     Physical Exam: Vital Signs Blood pressure 135/71, pulse 94, temperature 98.4 F (36.9 C), resp. rate 16, height _0  (1.803 m), weight 83 kg, SpO2 95 %.     General: awake, alert, appropriate, L frontal crani noted; NAD HENT: conjugate gaze; oropharynx moist CV: regular rate; no JVD Pulmonary: CTA B/L; no W/R/R- good air movement GI: soft, NT, ND, (+)BS- 2 drains in place- Dressing from thoracentesis C/D/i Psychiatric: appropriate Neurological: Ox3  Ext: no clubbing, cyanosis, or edema Psych: flat Genitourinary: Penis: Normal.  Comments: Light amber urine in foley as it was placed under sterile conditions Musculoskeletal:  Cervical back: Normal range of motionand neck supple.  Comments: RLE with multiple small stapled incisions.   UE's 5-/5 but equal B/L  LE's 4+/5 in HF, KE, DF and PF B/L  Skin: General: Skin is warmand dry.  Comments: RLE incisions as  well as abd incisions already noted Neurological:  Mental Status: He is oriented to person, place, and time.  Comments: Soft voice with mild left facial weakness. Slow to respond but able to follow basic motor commands.   Delayed but reasonable processing and speech today Intact to light touch in all 4 extremities     Assessment/Plan: 1. Functional deficits which require 3+ hours per day of interdisciplinary therapy in a comprehensive inpatient rehab setting.  Physiatrist is providing close team supervision and 24 hour management of active medical problems listed below.  Physiatrist and rehab team continue to assess barriers to discharge/monitor patient progress toward functional and medical goals  Care Tool:  Bathing    Body parts bathed by patient: Right arm,Left  arm,Chest,Abdomen,Face   Body parts bathed by helper: Front perineal area,Buttocks,Right upper leg,Left upper leg,Right lower leg,Left lower leg     Bathing assist Assist Level: Maximal Assistance - Patient 24 - 49%     Upper Body Dressing/Undressing Upper body dressing   What is the patient wearing?: Pull over shirt    Upper body assist Assist Level: Moderate Assistance - Patient 50 - 74%    Lower Body Dressing/Undressing Lower body dressing      What is the patient wearing?: Pants     Lower body assist Assist for lower body dressing: Moderate Assistance - Patient 50 - 74%     Toileting Toileting    Toileting assist Assist for toileting: Dependent - Patient 0%     Transfers Chair/bed transfer  Transfers assist     Chair/bed transfer assist level: Maximal Assistance - Patient 25 - 49%     Locomotion Ambulation   Ambulation assist   Ambulation activity did not occur: Safety/medical concerns          Walk 10 feet activity   Assist  Walk 10 feet activity did not occur: Safety/medical concerns        Walk 50 feet activity   Assist Walk 50 feet with 2 turns activity did not occur: Safety/medical concerns         Walk 150 feet activity   Assist Walk 150 feet activity did not occur: Safety/medical concerns         Walk 10 feet on uneven surface  activity   Assist Walk 10 feet on uneven surfaces activity did not occur: Safety/medical concerns         Wheelchair     Assist Will patient use wheelchair at discharge?: Yes Type of Wheelchair: Manual Wheelchair activity did not occur: Safety/medical concerns  Wheelchair assist level: Supervision/Verbal cueing Max wheelchair distance: 100'    Wheelchair 50 feet with 2 turns activity    Assist    Wheelchair 50 feet with 2 turns activity did not occur: Safety/medical concerns   Assist Level: Supervision/Verbal cueing   Wheelchair 150 feet activity     Assist   Wheelchair 150 feet activity did not occur: Safety/medical concerns       Blood pressure 135/71, pulse 94, temperature 98.4 F (36.9 C), resp. rate 16, height _0  (1.803 m), weight 83 kg, SpO2 95 %.  Medical Problem List and Plan: 1.L frontal craniotomysecondary to LDH/SAH from fall -patient may Shower if incisions covered -ELOS/Goals: 2-3 weeks- mod I to supervision             Con't PT and OT 2. Impaired mobility: -DVT/anticoagulation: Pharmaceutical: Continue Lovenox -antiplatelet therapy: N/a 3. Low back pain: continue air mattress. Resolved. Dicussed XR findings of degeneration.  4/25- was having back pain- and R knee pain- if doesn't improve, will schedule Am pain meds 4. Mood: LCSW to follow for evaluation and support.  -antipsychotic agents: N/A 5. Neuropsych: This patient is capable of making decisions on his own behalf. 6. Multiple deep tissue injuries/Skin/Wound Care: Continue Air mattress overlay with pressure relief measures.  --Continue vitamins and collagen to promote wound healing  7. Fluids/Electrolytes/Nutrition: Monitor I/O. Check lytes in am. 8. SDH (concerns of bacterial infection): S/p Burr hole evacuation  9. Strep anginosis bacteremia: Continue Ceftriaxone/Metronidazole X 6 weeks w/end date  10 Liver abscess s/p JP drain placement: Continue flushes TID and document output.   -IR is following. 11. New onset seizures: Continue Keppra bid till follow up with neuro on outpatient basis.  12. Left hip ORIF: WBAT. Will consult ortho to eval given worsening pain. Added MS contin for better pain control/participation with therapy. Appears to have helped, continue.  13. T2DM: Hgb A1C- 10.8. Will monitor BS ac/hs. Home regimen Levemir 10u am/15u pm with SSI.  --continue Levemir 13u bid with SSI and titrate insulin as indicated.             -4/23 improved control  4/25- BGs 68-256 in last  24 hours- much more labile today- if still an issue tomorrow, will ask DM coordinators.  14. BPH/Urinary retention: Foley replaced--keep in place for decompression for few days then start bladder program next week?.  --will increase flomax to 0.8 mg/HS  15. Pancreatic cancer with recurrence: Continue Creon with meals. --follow up with Hem/onc and Radiation Onc after discharge.  -wife asking about plans for radiation/chemo: will contact oncology to ask them to discuss with wife.  16. Low back pain/muscle spasms- will start Robaxin prn and lidoderm patches 1- 8pm to 8am.              4/16: denies pain: decrease robaxin to q12H prn.  17. Hypokalemia: scheduled supplement and repeat BMP Monday 18. Overweight BMI 25.52: provide counseling.  19. Elevated alk phos: increasing,  Oncology notified. Oncology has discussed plans with wife for post- CIR follow-up.  20. Right sided pleural effusion: could be contributing to his chest pain. Consulted IR regarding drainage/pleural studies given active cancer. ADDENDUM: s/p successful thoracentesis  -pt denies any SOB  4/25- VRE grew from Pleural effusion- ID to see today- linezolid started.     LOS: 10 days A FACE TO FACE EVALUATION WAS PERFORMED  Howell Groesbeck 11/22/2020, 6:40 PM

## 2020-11-22 NOTE — Progress Notes (Signed)
Physical Therapy Session Note  Patient Details  Name: Colton Bush MRN: [id number] Date of Birth: 04/26/1956  Today's Date: 11/22/2020 PT Individual Time: 1345-1427 PT Individual Time Calculation (min): 42 min   Short Term Goals: Week 1:  PT Short Term Goal 1 (Week 1): pt to demonstrate supine<>sit min A PT Short Term Goal 1 - Progress (Week 1): Progressing toward goal PT Short Term Goal 2 (Week 1): pt to demonstrate bed<>chair transfers mod A with LRAD PT Short Term Goal 2 - Progress (Week 1): Progressing toward goal PT Short Term Goal 3 (Week 1): pt to demonstrate WC mobility 75 min A PT Short Term Goal 3 - Progress (Week 1): Met PT Short Term Goal 4 (Week 1): to initiate gait PT Short Term Goal 4 - Progress (Week 1): Not met  Skilled Therapeutic Interventions/Progress Updates: Pt presents supine in bed and agreeable to therapy.  Pt required mod A for sup to sit transfers and then scoot to EOB.  Pt performed sit to stand w/ max A and then SPT to TIS w/c.  Pt performed multiple sit to stand from w/c to Stedy> w/c or perched on Stedy.  Pt able to stand up to 30 seconds before requesting sitting.  Pt performed multiple partial squats in Stedy.  Pt returned to bed in Stedy and required min A for LEs to sidelying and then rolled to supine.  Pt performed LE there ex in supine including AP, HS and abd/add 3 x 10 (AAROM LLE).  Pt remained in bed w/ all needs in reach, spouse present, railings up.     Therapy Documentation Precautions:  Restrictions Weight Bearing Restrictions: No RLE Weight Bearing: Weight bearing as tolerated General:   Vital Signs: Therapy Vitals Temp: 98.4 F (36.9 C) Pulse Rate: 94 Resp: 16 BP: 135/71 Oxygen Therapy SpO2: 95 % Pain: 3-4/10 low back.   :      Therapy/Group: Individual Therapy   P  11/22/2020, 2:28 PM  

## 2020-11-22 NOTE — Progress Notes (Signed)
Referring Physician(s): Dr. Cyd Silence  Supervising Physician: Mir, Sharen Heck  Patient Status:  [facility] - In-pt  Chief Complaint:  Hepatic abscess drain x 2 placed 11/06/20 with Dr. Earleen Newport   Subjective:  Patient sitting on a recline, not in acute distress.  States that he is doing well.  Denies fever, chills, abdominal pain, nausea, vomiting, diarrhea.   Allergies: Patient has no known allergies.  Medications: Prior to Admission medications   Medication Sig Start Date End Date Taking? Authorizing Provider  cefTRIAXone (ROCEPHIN) IVPB Inject 2 g into the vein every 12 (twelve) hours. Indication:  liver abscess and possible infected subdural hematoma First Dose: No Last Day of Therapy:  12/21/20 Labs - Once weekly:  CBC/D and BMP, Labs - Every other week:  ESR and CRP Method of administration: IV Push Method of administration may be changed at the discretion of home infusion pharmacist based upon assessment of the patient and/or caregiver's ability to self-administer the medication ordered. 11/12/20   Debbe Odea, MD  CREON 6000-19000 units CPEP Take 1 capsule by mouth with breakfast, with lunch, and with evening meal.  04/15/20   [provider]  enoxaparin (LOVENOX) 40 MG/0.4ML injection Inject 0.4 mLs (40 mg total) into the skin daily. 11/13/20   Debbe Odea, MD  ferrous sulfate 325 (65 FE) MG tablet Take 325 mg daily with breakfast by mouth.    [provider]  insulin detemir (LEVEMIR) 100 UNIT/ML injection Inject 0.1 mLs (10 Units total) into the skin 2 (two) times daily. 11/12/20   Debbe Odea, MD  levETIRAcetam (KEPPRA) 1000 MG tablet Take 1 tablet (1,000 mg total) by mouth 2 (two) times daily. 11/12/20   Debbe Odea, MD  loratadine (CLARITIN) 10 MG tablet Take 10 mg by mouth daily.    [provider]  metroNIDAZOLE (FLAGYL) 500 MG tablet Take 1 tablet (500 mg total) by mouth 3 (three) times daily. Treat through 12/21/20 11/12/20   Debbe Odea, MD   Multiple Vitamin (MULTIVITAMIN WITH MINERALS) TABS tablet Take 1 tablet by mouth daily.    [provider]  NOVOLOG FLEXPEN 100 UNIT/ML FlexPen GIVE EVERY MORNING WITH BREAKFAST AND EVERY EVENING WITH SUPPER PER SLIDING SCALE Patient taking differently: Inject 2-13 Units into the skin 2 (two) times daily. Sliding Scale : 101-150 - 2u 151-200 - 3u 201-250 - 5u 251-300 - 7u 301-350 - 9u 351- 400 - 11u 401-450 - 13u Greater than 150, alert MD 04/17/17   [name], MD  Omega-3 Fatty Acids (FISH OIL) 500 MG CAPS Take 500 mg by mouth daily.    [provider]  omeprazole (PRILOSEC) 20 MG capsule TAKE 1 CAPSULE DAILY 11/17/20   [name], MD  polyethylene glycol (MIRALAX / GLYCOLAX) 17 g packet Take 17 g by mouth daily as needed for mild constipation. 11/12/20   Debbe Odea, MD  rosuvastatin (CRESTOR) 20 MG tablet Take 1 tablet (20 mg total) by mouth daily. 07/22/20   [name], MD  tamsulosin (FLOMAX) 0.4 MG CAPS capsule Take 1 capsule (0.4 mg total) by mouth daily. 10/29/20   [place] Donnamarie Poag, DO     Vital Signs: BP 111/61 (BP Location: Left Arm)   Pulse 96   Temp 99.1 F (37.3 C)   Resp 16   Ht 5' 11"  (1.803 m)   Wt 182 lb 15.7 oz (83 kg)   SpO2 92%   BMI 25.52 kg/m   Physical Exam Vitals reviewed.  Constitutional:  General: He is not in acute distress. HENT:     Head: Normocephalic and atraumatic.  Cardiovascular:     Rate and Rhythm: Normal rate.  Pulmonary:     Effort: Pulmonary effort is normal. No respiratory distress.  Abdominal:     General: Abdomen is flat.     Palpations: Abdomen is soft.     Comments: Positive 2 RUQ drains to suction bulbs. Sites are unremarkable with no erythema, edema, tenderness, bleeding or drainage. Suture and stat lock in place. Dressing is clean, dry, and intact.  Superior RUQ drain: 20 ml of dark green, bilious fluid noted in the suction bulb marked #1. Inferior RUQ drain: Trace of serosanguinous fluid  noted in the suction bulb marked # 2.  Drains aspirate and flushe well.   Neurological:     Mental Status: He is alert.     Imaging: DG Chest 1 View  Result Date: 11/19/2020 CLINICAL DATA:  Status post thoracentesis. EXAM: CHEST  1 VIEW COMPARISON:  11/18/2020 FINDINGS: Near complete resolution of the right-sided pleural effusion. No evidence for right pneumothorax. Atelectasis noted right lung base. Minimal subsegmental atelectasis or linear scarring noted left lower lung. The cardiopericardial silhouette is within normal limits for size. The visualized bony structures of the thorax show no acute abnormality. Percutaneous drainage catheters overlie the right upper quadrant of the abdomen. IMPRESSION: Near complete resolution of right pleural effusion status post thoracentesis. No pneumothorax. Electronically Signed   By: Misty Stanley M.D.   On: 11/19/2020 12:14   DG Chest 2 View  Result Date: 11/18/2020 CLINICAL DATA:  Chest pain. EXAM: CHEST - 2 VIEW COMPARISON:  11/08/2020. FINDINGS: Borderline cardiomegaly. Diffuse right lung infiltrate. Moderate right pleural effusion. Mild atelectasis left lung base. No pneumothorax. Prior cervical spine fusion. IMPRESSION: 1.  Diffuse right lung infiltrate.  Moderate right pleural effusion. 2.  Mild left base subsegmental atelectasis. 3.  Borderline cardiomegaly. Electronically Signed   By: Marcello Moores  Register   On: 11/18/2020 15:18   IR THORACENTESIS ASP PLEURAL SPACE W/IMG GUIDE  Result Date: 11/19/2020 INDICATION: Patient with a history of pancreatic cancer presents today with pleural effusions. Interventional radiology asked to perform a therapeutic and diagnostic thoracentesis. EXAM: ULTRASOUND GUIDED THORACENTESIS MEDICATIONS: 1% lidocaine 10 mL COMPLICATIONS: None immediate. PROCEDURE: An ultrasound guided thoracentesis was thoroughly discussed with the patient and questions answered. The benefits, risks, alternatives and complications were also  discussed. The patient understands and wishes to proceed with the procedure. Written consent was obtained. Ultrasound was performed to localize and mark an adequate pocket of fluid in the right chest. The area was then prepped and draped in the normal sterile fashion. 1% Lidocaine was used for local anesthesia. Under ultrasound guidance a 6 Fr Safe-T-Centesis catheter was introduced. Thoracentesis was performed. The catheter was removed and a dressing applied. FINDINGS: A total of approximately 900 mL of amber-colored fluid was removed. Samples were sent to the laboratory as requested by the clinical team. IMPRESSION: Successful ultrasound guided right thoracentesis yielding 900 mL of pleural fluid. Read by: Soyla Dryer, NP Electronically Signed   By: Ruthann Cancer MD   On: 11/19/2020 12:28    Labs:  CBC: Recent Labs    11/10/20 0829 11/13/20 0447 11/15/20 0519 11/22/20 0503  WBC 11.0* 10.5 7.9 10.6*  HGB 10.9* 10.0* 9.9* 9.7*  HCT 33.5* 30.6* 30.7* 30.4*  PLT 469* 404* 470* 489*    COAGS: Recent Labs    10/19/20 0259 10/24/20 0814 11/05/20 1930 11/06/20 0337  INR  1.4* 1.2 1.5* 1.6*  APTT  --   --  32 36    BMP: Recent Labs    04/14/20 0925 07/09/20 0825 11/15/20 0519 11/16/20 0525 11/17/20 0524 11/22/20 0503  NA 141   < > 139 137 135 134*  K 3.3*   < > 3.2* 3.1* 3.6 3.7  CL 107   < > 105 103 102 100  CO2 27   < > 28 28 28 27   GLUCOSE 73   < > 215* 97 107* 66*  BUN 9   < > 8 7* 6* 6*  CALCIUM 8.8*   < > 8.1* 7.9* 8.1* 8.3*  CREATININE 0.71   < > 0.48* 0.42* 0.44* 0.42*  GFRNONAA >60   < > >60 >60 >60 >60  GFRAA >60  --   --   --   --   --    < > = values in this interval not displayed.    LIVER FUNCTION TESTS: Recent Labs    11/13/20 0447 11/15/20 0519 11/17/20 0524 11/22/20 0503  BILITOT 0.9 0.5 0.4 0.7  AST 20 24 17  12*  ALT 20 23 21 10   ALKPHOS 270* 660* 788* 354*  PROT 5.9* 6.0* 5.8* 6.3*  ALBUMIN 1.7* 1.7* 1.8* 1.9*    Assessment and  Plan:  Hepatic abscess drain x 2 placed 11/06/20 with Dr. Earleen Newport.   Patient stable, has no abdominal complaints today.  Afebrile overnight.  CBC stable WBC 10.6, Hgb 9.7  Overnight OP   - Superior RUQ (#1): 20cc   -  Inferior RUQ (#2): 0 cc    Continue with flushing TID, output recording q shift and dressing changes as needed. Would consider additional imaging when output is less than 10 ml for 24 hours not including flush material.    Further treatment plan per primary team. Aprreciate and agree with the plan.  IR to follow.    Electronically Signed: Tera Mater, PA-C 11/22/2020, 10:12 AM   I spent a total of 15 Minutes at the the patient's bedside AND on the patient's hospital floor or unit, greater than 50% of which was counseling/coordinating care for RUQ abscess drains x 2

## 2020-11-22 NOTE — Progress Notes (Signed)
Occupational Therapy Session Note  Patient Details  Name: Colton Bush MRN: [id number] Date of Birth: [birth year]  Today's Date: 11/22/2020 OT Individual Time: 0700-0800 OT Individual Time Calculation (min): 60 min    Short Term Goals: Week 2:  OT Short Term Goal 1 (Week 2): P twill transfer with +1 A and LRAD ot BSC OT Short Term Goal 2 (Week 2): Pt will sit to stand consistently with MOD A in prep for clothing managment OT Short Term Goal 3 (Week 2): Pt will groom seated EOB with S  Skilled Therapeutic Interventions/Progress Updates:     Pt resting in bed upon arrival and agreeable to getting OOB. When questioned if he had a BM, pt stated he thought he did about 1.5 hours prior. Educated pt on importance of notifying staff when he needs to have a BM and/or after he has had a BM in bed/w/c. Pt verbalized understanding but has continually been incontinent of bowel. Dependent for hygiene and clothing management at bed level. Supine>sit EOB with CGA. Sit<>stand in [place] with max A. Transfer to TIS w/c with Stedy. Grooming at sink with supervision. Pt does not have any clothing. Requested clothing from wife last week. Pt remained in TIS w/c with belt alarm activated. All needs within reach. Therapy Documentation Precautions:  Restrictions Weight Bearing Restrictions: No RLE Weight Bearing: Weight bearing as tolerated Pain: Pt denied pain this morning except when flexing Rt knee when he grimaced and stated the front of his knee was very painful. Resolved with repositioning. MD Lovorn aware.    Therapy/Group: Individual Therapy  [name] 11/22/2020, 8:06 AM

## 2020-11-22 NOTE — Progress Notes (Signed)
[facility] Infectious Diseases Follow Up Note  Patient Identification: Patient Name: Colton Bush MRN: [id number] [place] Date: 11/12/2020  5:52 PM Age: 65 y.o.Today's Date: 11/22/2020   Reason for Visit: VRE growing from pleural fluid cultures  Principal Problem:   Subdural hematoma (HCC) Active Problems:   Liver abscess   Bladder outlet obstruction   Seizure in setting of subdural hematoma   Toxic encephalopathy   Antibiotics: Ceftriaxone 4/12-current                    Metronidazole 4/12-current                    Total days of antibiotics 18  Lines/Tubes: PIV's, RUQ JP drain, Inferior RUQ JP drain, urethral catheter  Interval Events: Afebrile, no leukocytosis, hemodynamically stable   Assessment Strep anginosus bacteremia. TTE with no vegetations  Liver abscess status post IR guided drain ( polymicrobial - E coli, Streptococcus anginosus, Klebsiella pneumoniae) Left subdural hematoma status post bur hole craniotomy Right lung empyema secondary to VRE   Recommendations Continue Ceftriaxone ( CNS dosing)  and metronidazole as previously recommended for 6 weeks from 4/9 Switch to linezolid from IV to p.o. for VRE Monitor CBC and CMP on IV antibiotics.   Rest of the management as per the primary team. Thank you for the consult. Please page with pertinent questions or concerns.  ______________________________________________________________________ Subjective patient seen and examined at the bedside.  Sitting up in chair and eating ice cream.  Denies fevers chills and sweats.  Denies nausea vomiting and diarrhea.  Cough is minimal denies any shortness of breath.  He is working with PT.  Appetite is improving, denies any abdominal pain.  Vitals BP 111/61 (BP Location: Left Arm)   Pulse 96   Temp 99.1 F (37.3 C)   Resp 16   Ht 5\' 11"  (1.803 m)   Wt 83 kg   SpO2 92%   BMI 25.52 kg/m     Physical  Exam Constitutional:   Sitting up in chair, not in acute distress    Comments:   Cardiovascular:     Rate and Rhythm: Normal rate and regular rhythm.     Heart sounds: No murmur heard.   Pulmonary:     Effort: Pulmonary effort is normal.     Comments: Clear lungs bilaterally, occasional rales  Abdominal:     Palpations: Abdomen is soft.     Tenderness: Nontender, 2 JP drains with minimal fluid  Musculoskeletal:        General: No swelling or tenderness.   Skin:    Comments: No obvious rashes  Neurological:     General: No focal deficit present.   Psychiatric:        Mood and Affect: Mood normal.    Pertinent Microbiology Results for orders placed or performed during the hospital encounter of 11/12/20  Acid Fast Smear (AFB)     Status: None   Collection Time: 11/19/20 11:52 AM   Specimen: Lung, Right; Pleural Fluid  Result Value Ref Range Status   AFB Specimen Processing Concentration  Final   Acid Fast Smear Negative  Final    Comment: (NOTE) Performed At: [facility] [place], [place] [id number] Rush Farmer MD AC:[id number]    Source (AFB) PLEURAL  Final    Comment: FLUID LUNG RIGHT Performed at [facility], [place] [address], [place], [place] [address]   Body fluid culture w Gram Stain  Status: None   Collection Time: 11/19/20 11:52 AM   Specimen: Lung, Right; Pleural Fluid  Result Value Ref Range Status   Specimen Description PLEURAL FLUID LUNG RIGHT  Final   Special Requests PLEURAL FLUID RIGHT LUNG  Final   Gram Stain   Final    WBC PRESENT,BOTH PMN AND MONONUCLEAR GRAM POSITIVE COCCI CYTOSPIN SMEAR Performed at [facility], [place] [address], [place], [place] [address]    Culture FEW VANCOMYCIN RESISTANT ENTEROCOCCUS  Final   Report Status 11/21/2020 FINAL  Final   Organism ID, Bacteria VANCOMYCIN RESISTANT ENTEROCOCCUS  Final      Susceptibility   Vancomycin resistant enterococcus - MIC*    AMPICILLIN >=32  RESISTANT Resistant     VANCOMYCIN >=32 RESISTANT Resistant     GENTAMICIN SYNERGY SENSITIVE Sensitive     LINEZOLID 2 SENSITIVE Sensitive     * FEW VANCOMYCIN RESISTANT ENTEROCOCCUS    Pertinent Lab. CBC Latest Ref Rng & Units 11/22/2020 11/15/2020 11/13/2020  WBC 4.0 - 10.5 K/uL 10.6(H) 7.9 10.5  Hemoglobin 13.0 - 17.0 g/dL 9.7(L) 9.9(L) 10.0(L)  Hematocrit 39.0 - 52.0 % 30.4(L) 30.7(L) 30.6(L)  Platelets 150 - 400 K/uL 489(H) 470(H) 404(H)   CMP Latest Ref Rng & Units 11/22/2020 11/17/2020 11/16/2020  Glucose 70 - 99 mg/dL 66(L) 107(H) 97  BUN 8 - 23 mg/dL 6(L) 6(L) 7(L)  Creatinine 0.61 - 1.24 mg/dL 0.42(L) 0.44(L) 0.42(L)  Sodium 135 - 145 mmol/L 134(L) 135 137  Potassium 3.5 - 5.1 mmol/L 3.7 3.6 3.1(L)  Chloride 98 - 111 mmol/L 100 102 103  CO2 22 - 32 mmol/L 27 28 28   Calcium 8.9 - 10.3 mg/dL 8.3(L) 8.1(L) 7.9(L)  Total Protein 6.5 - 8.1 g/dL 6.3(L) 5.8(L) -  Total Bilirubin 0.3 - 1.2 mg/dL 0.7 0.4 -  Alkaline Phos 38 - 126 U/L 354(H) 788(H) -  AST 15 - 41 U/L 12(L) 17 -  ALT 0 - 44 U/L 10 21 -     Pertinent Imaging today Plain films and CT images have been personally visualized and interpreted; radiology reports have been reviewed. Decision making incorporated into the Impression / Recommendations.  Chest Xray  FINDINGS: Near complete resolution of the right-sided pleural effusion. No evidence for right pneumothorax. Atelectasis noted right lung base. Minimal subsegmental atelectasis or linear scarring noted left lower lung. The cardiopericardial silhouette is within normal limits for size. The visualized bony structures of the thorax show no acute abnormality. Percutaneous drainage catheters overlie the right upper quadrant of the abdomen.  IMPRESSION: Near complete resolution of right pleural effusion status post thoracentesis. No pneumothorax.  I have spent approx 30 minutes for this patient encounter including review of prior medical records, coordination of  care  with greater than 50% of time being face to face/counseling and discussing diagnostics/treatment plan with the patient/family.  Electronically signed by:   Rosiland Oz, MD Infectious Disease Physician [facility] Pager: [phone]

## 2020-11-22 NOTE — Progress Notes (Signed)
Speech Language Pathology Daily Session Note  Patient Details  Name: Colton Bush MRN: [id number] Date of Birth: [birth year]  Today's Date: 11/22/2020 SLP Individual Time: 7846-9629 SLP Individual Time Calculation (min): 45 min  Short Term Goals: Week 2: SLP Short Term Goal 1 (Week 2): Pt will complete moderately complex problem solving tasks with min A verbal cues SLP Short Term Goal 2 (Week 2): Pt will increase short term recall of novel/functional information with Supervision A cues SLP Short Term Goal 3 (Week 2): Pt will increase judgement/safety for home management tasks and ADLs with min A cues to recall current physical impairments  Skilled Therapeutic Interventions: Skilled SLP intervention focused on cognition. Pt very tired this session while seated upright in wheelchair. Increase cues needed for initiation with semi complex problem solving tasks. He completed functional time calculations using daily schedule with mod A for attention. Pt often closed eyes during tasks. Pt recalled 3 tasks that he will need assistance with following dc home due to physical impairment with mod A verbal and visual cues. Cont with therapy per plan of care.      Pain Pain Assessment Pain Scale: Faces Faces Pain Scale: No hurt  Therapy/Group: Individual Therapy  Darrol Poke Jediah Horger 11/22/2020, 3:26 PM

## 2020-11-22 NOTE — Progress Notes (Signed)
Physical Therapy Session Note  Patient Details  Name: VICTORMANUEL Bush MRN: [id number] Date of Birth: Mar 27, 1956  Today's Date: 11/22/2020 PT Individual Time: 1000-1100 PT Individual Time Calculation (min): 60 min   Short Term Goals: Week 2:  PT Short Term Goal 1 (Week 2): Pt will perform least restrictive transfer with mod A consistently PT Short Term Goal 2 (Week 2): Pt will initiate gait training PT Short Term Goal 3 (Week 2): Pt will perform bed mobility with min A consistently  Skilled Therapeutic Interventions/Progress Updates:    Pt received seated in TIS chair in room, agreeable to PT session. No complaints of pain at rest, has some R knee and low back pain by end of session. Utilized repositioning and stretching for pain management. Sit to stand in // bars x 5 reps with max A with R knee blocked. Pt initially with poor tolerance for upright stance with max cueing needed for full LE and trunk extension. As pt progresses with standing repetitions he exhibits improved tolerance for standing and improved posture. Pt able to perform lateral weight shifts in standing and take a few steps backwards with max A for balance in // bars. Pt requests to return to bed at end of session. Sit to stand with total A from w/c to stedy. Stedy transfer back to bed. Sit to supine CGA with improved ability to maneuver BLE into bed this date. Supine LTR x 10 reps with mod A needed for correct exercise performance. RLE heel slides and hip flexion AAROM x 5 reps each. Pt left supine in bed with needs in reach at end of session.  Therapy Documentation Precautions:  Restrictions Weight Bearing Restrictions: No RLE Weight Bearing: Weight bearing as tolerated   Therapy/Group: Individual Therapy   Excell Seltzer, PT, DPT, CSRS  11/22/2020, 12:02 PM

## 2020-11-22 NOTE — Significant Event (Signed)
Hypoglycemic Event  CBG: 68  Treatment: orange juice   Symptoms: none noted   Follow-up CBG: Time:609 CBG Result:105  Possible Reasons for Event: unknown   Comments/MD notified:    Celesta Aver

## 2020-11-23 ENCOUNTER — Telehealth: Payer: Self-pay | Admitting: Radiation Oncology

## 2020-11-23 ENCOUNTER — Ambulatory Visit: Payer: Medicare Other | Admitting: Radiation Oncology

## 2020-11-23 DIAGNOSIS — R569 Unspecified convulsions: Secondary | ICD-10-CM

## 2020-11-23 DIAGNOSIS — R52 Pain, unspecified: Secondary | ICD-10-CM

## 2020-11-23 LAB — GLUCOSE, CAPILLARY
Glucose-Capillary: 67 mg/dL — ABNORMAL LOW (ref 70–99)
Glucose-Capillary: 108 mg/dL — ABNORMAL HIGH (ref 70–99)
Glucose-Capillary: 291 mg/dL — ABNORMAL HIGH (ref 70–99)
Glucose-Capillary: 190 mg/dL — ABNORMAL HIGH (ref 70–99)
Glucose-Capillary: 256 mg/dL — ABNORMAL HIGH (ref 70–99)

## 2020-11-23 NOTE — Consult Note (Signed)
Neuropsychological Consultation   Patient:   Colton Bush   DOB:   Oct 03, 1955  MR Number:  [id number]  Location:  [facility] [address] [facility] [place] [id number] [place] [address] Dept: [place]: [phone]           Date of Service:   11/23/2020  Start Time:   3 PM End Time:   4 PM  Provider/Observer:  Ilean Skill, Psy.D.       Clinical Neuropsychologist       Billing Code/Service: 05397  Chief Complaint:    Colton Bush is a 65 year old male with a history of type 2 diabetes, hypertension, previous spinal cord injury, pancreatic cancer with recurrence and recent admission on 10/18/2020 for right hip fracture complicated by UTI/strep bacteremia and urinary retention.  Patient was discharged to [facility] skilled nursing for rehab.  Patient was readmitted from SNF on 11/05/2020 with SIRS and was found to have multiple liver abscesses.  Patient underwent intervention and revealed strep infection and urine culture positive for E. coli.  Patient continued to have issues with confusion and MRI done revealing suspected areas of acute ischemia in right temporal and parietal lobe regions but with diffusion changes.  CT head revealed late subacute or chronic subdural hematoma measuring 10 mm in thickness.  On 4/11 he developed seizure and was loaded with Keppra.  Patient was taken to the OR on 11/09/2020 for bur hole evacuation.  His antibiotics were changed on 4/12 as liver aspiration was positive for Klebsiella and E. coli and concerns of infected subdural hematoma.  Postoperatively, the patient had significant improvement in speech and cognition with continued current dose of Keppra.  Patient had ongoing difficulties with functioning recommended for CIR due to functional deficits.  Reason for Service:  Patient was referred for neuropsychological consultation due to coping and adjustment in ongoing cognitive  changes that are significantly improving.  Below is the HPI for the current admission.  HPI: Mar[place]. Colton Bush is a 65 year old male with history of T2DM (Dr. Bubba Camp), HTN, SCI, pancreatic cancer with recurrence and recent admission 10/18/20 for right hip fracture s/p ORIF complicated by UTI/strep bacteremia and urinary retention who was d/c to [facility] for rehab. He was readmitted from SNF on 11/05/20 with SIRS and was found to have multiple liver abscesses.  He underwent IR guided aspiration with drain placement revealing strep anginagnosis and Urine culture positive for E coli. Unasyn was recommended by Dr. Tommy Medal.  He continued to have issues with confusion and MRI brain done revealing suspected areas of acute ischemia in left temporal and parietal lobe but with diffusion changes.  CT head revealed late subacute or chronic SDH measuring 10 mm in thickness.  On 04/11, he developed seizures and was loaded with Keppra.  Swallow evaluation done revealing mild to moderate oropharyngeal dysphagia in part due to cognition and aspiration precautions recommended with  Dr. Venetia Constable was consulted for input due to concerns of subdural hemorrhage versus empyema and he was taken to the OR on 04/12 for bur hole evacuation. His antibiotics were changed to metronidazole and ceftriaxone on 04/12 as liver aspiration positive for Klebsiella and E. Coli and concerns of infected SDH. Postop has had significant improvement in speech and mentation and Dr. Leonel Ramsay recommends continuing current dose Keppra and following up with neurology on outpatient basis for wean. Foley was removed this am but he had problems with retention and was cathed for  1300 cc this am therefore foley was replaced. Therapy ongoing and patient continues to be limited  CIR recommended due to functional deficits.  Current Status:  Patient was alert and oriented sitting slightly reclined in his wheelchair.  Patient was able to describe much of what  is been going on with him medically over the past couple of months since he started out initially with his fractured hip.  Patient describes a series of increasing complications.  He described a lot of stress with his wife having to take over a lot of the duties at home regarding bill payment etc. which the patient had always done in her becoming increasingly stressed over bills and financial matters.  The patient has improved significantly from a cognitive standpoint but continues to be tired and lethargic.  The patient reports that given the circumstance he feels like he is coping fairly well but is experiencing increased stress in response to his wife stress.  Behavioral Observation: OLON RUSS  presents as a 65 y.o.-year-old Right Caucasian Male who appeared his stated age. his dress was Appropriate and he was Well Groomed and his manners were Appropriate to the situation.  his participation was indicative of Appropriate and Redirectable behaviors.  There were physical disabilities noted.  he displayed an appropriate level of cooperation and motivation.     Interactions:    Active Appropriate  Attention:   abnormal and attention span appeared shorter than expected for age  Memory:   within normal limits; recent and remote memory intact  Visuo-spatial:  not examined  Speech (Volume):  low  Speech:   normal; slowed response  Thought Process:  Coherent and Relevant  Though Content:  WNL; not suicidal and not homicidal  Orientation:   person, place, time/date and situation  Judgment:   Fair  Planning:   Fair  Affect:    Lethargic  Mood:    Dysphoric  Insight:   Fair  Intelligence:   normal   Medical History:   Past Medical History:  Diagnosis Date  . Arthritis    left hand  . Bronchitis 1977  . Cancer ([place]) 03/09/2016   pancreatic cancer, sees Dr. Cristino Martes at [facility]   . Depression    takes Cymbalta daily  . Diabetes mellitus type II    sees Dr. Chalmers Cater   . GERD  (gastroesophageal reflux disease)    takes Omeprazole daily  . H/O hiatal hernia   . Hyperlipidemia    takes Zocor daily  . Hypertension    takes Amlodipine daily  . Hypoglycemia 06/18/2017  . Neck pain    C4-7 stenosis and herniated disc  . Neuromuscular disorder ([place])    hiatal hernia  . Scoliosis    slight  . Spinal cord injury, C5-C7 ([place])    c4-c7  . Stiffness of hand joint    d/t cervical issues         Patient Active Problem List   Diagnosis Date Noted  . Pain   . Bladder outlet obstruction 11/12/2020  . Seizure in setting of subdural hematoma 11/12/2020  . Toxic encephalopathy 11/12/2020  . Atrophic pancreas 11/12/2020  . Subdural hematoma ([place])   . Pneumonia of both lower lobes due to infectious organism 11/06/2020  . Liver abscess 11/06/2020  . Sepsis ([place]) 11/05/2020  . Uncontrolled type 2 diabetes mellitus with ketoacidosis without coma, with long-term current use of insulin ([place]) 11/05/2020  . Increased anion gap metabolic acidosis 03/13/4817  . Chronic diastolic CHF (congestive heart  failure) ([place]) 11/05/2020  . Closed right hip fracture, initial encounter ([place]) 10/18/2020  . Fall from ground level 10/18/2020  . Hypoglycemia 06/19/2017  . Hypothermia 06/19/2017  . Goals of care, counseling/discussion 09/29/2016  . Port catheter in place 04/11/2016  . Hypercalcemia 03/29/2016  . Adenocarcinoma of head of pancreas ([place]) 03/17/2016  . Biliary obstruction   . Obstructive jaundice due to malignant neoplasm ([place]) 02/27/2016  . DKA (diabetic ketoacidosis) ([place]) 02/27/2016  . Mixed diabetic hyperlipidemia associated with type 2 diabetes mellitus ([place]) 05/05/2014  . Cervical spondylosis with myelopathy 08/18/2011  . CERUMEN IMPACTION 12/14/2008  . Hyperlipidemia, mixed 09/16/2007  . Essential hypertension 09/16/2007  . GERD without esophagitis 09/16/2007  . ESOPHAGEAL STRICTURE 04/04/2007  . HIATAL HERNIA 04/04/2007     Psychiatric History:  No prior  psychiatric history  Family Med/Psych History:  Family History  Problem Relation Age of Onset  . Heart disease Father   . Heart disease Brother 20  . Anesthesia problems Mother   . Heart disease Mother   . Dementia Mother   . Diabetes Sister   . Stroke Sister   . Colon cancer Neg Hx   . Rectal cancer Neg Hx   . Stomach cancer Neg Hx     Impression/DX:  LA CABREROS is a 65 year old male with a history of type 2 diabetes, hypertension, previous spinal cord injury, pancreatic cancer with recurrence and recent admission on 10/18/2020 for right hip fracture complicated by UTI/strep bacteremia and urinary retention.  Patient was discharged to [facility] skilled nursing for rehab.  Patient was readmitted from SNF on 11/05/2020 with SIRS and was found to have multiple liver abscesses.  Patient underwent intervention and revealed strep infection and urine culture positive for E. coli.  Patient continued to have issues with confusion and MRI done revealing suspected areas of acute ischemia in right temporal and parietal lobe regions but with diffusion changes.  CT head revealed late subacute or chronic subdural hematoma measuring 10 mm in thickness.  On 4/11 he developed seizure and was loaded with Keppra.  Patient was taken to the OR on 11/09/2020 for bur hole evacuation.  His antibiotics were changed on 4/12 as liver aspiration was positive for Klebsiella and E. coli and concerns of infected subdural hematoma.  Postoperatively, the patient had significant improvement in speech and cognition with continued current dose of Keppra.  Patient had ongoing difficulties with functioning recommended for CIR due to functional deficits.  Patient was alert and oriented sitting slightly reclined in his wheelchair.  Patient was able to describe much of what is been going on with him medically over the past couple of months since he started out initially with his fractured hip.  Patient describes a series of increasing  complications.  He described a lot of stress with his wife having to take over a lot of the duties at home regarding bill payment etc. which the patient had always done in her becoming increasingly stressed over bills and financial matters.  The patient has improved significantly from a cognitive standpoint but continues to be tired and lethargic.  The patient reports that given the circumstance he feels like he is coping fairly well but is experiencing increased stress in response to his wife stress.  Disposition/Plan:  Today we worked on coping and adjustment with extended hospital stay and serious medical illness and a cascade of successive medical complications since his broken hip.  The patient has made significant improvements in cognition but continues to have  slowed information processing speed but is continuing to make progress and his level of cognitive abilities appear to be adequate to allow him to continue to progress and make appropriate decisions during therapeutic efforts.  Diagnosis:    Pain - Plan: DG Lumbar Spine 2-3 Views, DG Lumbar Spine 2-3 Views, DG HIP UNILAT WITH PELVIS 2-3 VIEWS RIGHT, DG HIP UNILAT WITH PELVIS 2-3 VIEWS RIGHT, Cytology - Non PAP;, Cytology - Non PAP;, Body fluid cell count with differential, Body fluid cell count with differential, Albumin, pleural or peritoneal fluid, Albumin, pleural or peritoneal fluid, Protein, pleural or peritoneal fluid, Protein, pleural or peritoneal fluid, Fungus Culture With Stain, Fungus Culture With Stain, Acid Fast Culture with reflexed sensitivities, Acid Fast Culture with reflexed sensitivities, Acid Fast Smear (AFB), Acid Fast Smear (AFB), Body fluid culture w Gram Stain, Body fluid culture w Gram Stain  Chest pain - Plan: DG Chest 2 View, DG Chest 2 View, Cytology - Non PAP;, Cytology - Non PAP;, Body fluid cell count with differential, Body fluid cell count with differential, Albumin, pleural or peritoneal fluid, Albumin, pleural  or peritoneal fluid, Protein, pleural or peritoneal fluid, Protein, pleural or peritoneal fluid, Fungus Culture With Stain, Fungus Culture With Stain, Acid Fast Culture with reflexed sensitivities, Acid Fast Culture with reflexed sensitivities, Acid Fast Smear (AFB), Acid Fast Smear (AFB), Body fluid culture w Gram Stain, Body fluid culture w Gram Stain  S/P thoracentesis - Plan: DG Chest 1 View, DG Chest 1 View, Cytology - Non PAP;, Cytology - Non PAP;, Body fluid cell count with differential, Body fluid cell count with differential, Albumin, pleural or peritoneal fluid, Albumin, pleural or peritoneal fluid, Protein, pleural or peritoneal fluid, Protein, pleural or peritoneal fluid, Fungus Culture With Stain, Fungus Culture With Stain, Acid Fast Culture with reflexed sensitivities, Acid Fast Culture with reflexed sensitivities, Acid Fast Smear (AFB), Acid Fast Smear (AFB), Body fluid culture w Gram Stain, Body fluid culture w Gram Stain         Electronically Signed   _______________________ Ilean Skill, Psy.D. Clinical Neuropsychologist

## 2020-11-23 NOTE — Telephone Encounter (Signed)
I left a message for the patient's wife to see if they have any concerns or questions about our plans for start of his radiation on 11/30/2020 and encouraged her to call back if there needs to be a change in that plan.

## 2020-11-23 NOTE — Progress Notes (Signed)
[facility] Infectious Diseases Follow Up Note  Patient Identification: Patient Name: Colton Bush MRN: [id number] [place] Date: 11/12/2020  5:52 PM Age: 65 y.o.Today's Date: 11/23/2020   Reason for Visit: Follow-up on empyema and liver abscess  Principal Problem:   Subdural hematoma [facility]) Active Problems:   Liver abscess   Bladder outlet obstruction   Seizure in setting of subdural hematoma   Toxic encephalopathy   Antibiotics: Ceftriaxone 4/12-current                    Metronidazole 4/12-current                    Linezolid 4/25- current                     Total days of antibiotics 18  Lines/Tubes: PIV's, RUQ JP drain, Inferior RUQ JP drain, urethral catheter   Interval Events: Continues to remain afebrile, mild leukocytosis 10.6   Assessment Streptococcus anginosus bacteremia.  TTE with no vegetations Liver abscess status post IR guided drainage with polymicrobial cultures (E. Coli, Streptococcus anginosus and Klebsiella pneumonia) Left subdural hematoma status post bur hole craniotomy Right lung empyema secondary to VRE   Recommendations Continue ceftriaxone and metronidazole as previously recommended for 6 weeks from 4/9.   Would have IR evaluate for possible need of removal of drain as patient has not been draining much Continue linezolid for 2 weeks from 4/22 Monitor CBC and BMP while on antibiotics Patient has an appointment with Dr. Tommy Medal on 5/13 at 3:45 PM  Rest of the management as per the primary team. Thank you for the consult. Please page with pertinent questions or concerns.  ______________________________________________________________________ Subjective patient seen and examined at the bedside.  Sitting up in chair, having breakfast.  No new complaints.  Left hip pain is actually getting better  Vitals BP 125/67 (BP Location: Right Arm)   Pulse 91   Temp 98.9 F (37.2 C)   Resp 18   Ht  5\' 11"  (1.803 m)   Wt 83 kg   SpO2 94%   BMI 25.52 kg/m     Physical Exam Constitutional:   Not in acute distress, appears comfortable    Comments:   Cardiovascular:     Rate and Rhythm: Normal rate and regular rhythm.     Heart sounds: No murmur heard.   Pulmonary:     Effort: Pulmonary effort is normal.     Comments: Decreased air entry in the right side, occasional Rales  Abdominal:     Palpations: Abdomen is soft.     Tenderness: Nontender, 2 JP drains in the right side very minimal fluid  Musculoskeletal:        General: No swelling or tenderness.   Skin:    Comments: No obvious rashes  Neurological:     General: No focal deficit present.   Psychiatric:        Mood and Affect: Mood normal.     Pertinent Microbiology Results for orders placed or performed during the hospital encounter of 11/12/20  Acid Fast Smear (AFB)     Status: None   Collection Time: 11/19/20 11:52 AM   Specimen: Lung, Right; Pleural Fluid  Result Value Ref Range Status   AFB Specimen Processing Concentration  Final   Acid Fast Smear Negative  Final    Comment: (NOTE) Performed At: [facility] [address] [place], [place] [id number] Rush Farmer MD PP:[id number]    Source (  AFB) PLEURAL  Final    Comment: FLUID LUNG RIGHT Performed at [facility], [place] [address] [place], [place] [address]   Body fluid culture w Gram Stain     Status: None   Collection Time: 11/19/20 11:52 AM   Specimen: Lung, Right; Pleural Fluid  Result Value Ref Range Status   Specimen Description PLEURAL FLUID LUNG RIGHT  Final   Special Requests PLEURAL FLUID RIGHT LUNG  Final   Gram Stain   Final    WBC PRESENT,BOTH PMN AND MONONUCLEAR GRAM POSITIVE COCCI CYTOSPIN SMEAR Performed at [facility], [place] [address] [place], [place] [address]    Culture FEW VANCOMYCIN RESISTANT ENTEROCOCCUS  Final   Report Status 11/21/2020 FINAL  Final   Organism ID, Bacteria VANCOMYCIN  RESISTANT ENTEROCOCCUS  Final      Susceptibility   Vancomycin resistant enterococcus - MIC*    AMPICILLIN >=32 RESISTANT Resistant     VANCOMYCIN >=32 RESISTANT Resistant     GENTAMICIN SYNERGY SENSITIVE Sensitive     LINEZOLID 2 SENSITIVE Sensitive     * FEW VANCOMYCIN RESISTANT ENTEROCOCCUS    Pertinent Lab. CBC Latest Ref Rng & Units 11/22/2020 11/15/2020 11/13/2020  WBC 4.0 - 10.5 K/uL 10.6(H) 7.9 10.5  Hemoglobin 13.0 - 17.0 g/dL 9.7(L) 9.9(L) 10.0(L)  Hematocrit 39.0 - 52.0 % 30.4(L) 30.7(L) 30.6(L)  Platelets 150 - 400 K/uL 489(H) 470(H) 404(H)   CMP Latest Ref Rng & Units 11/22/2020 11/17/2020 11/16/2020  Glucose 70 - 99 mg/dL 66(L) 107(H) 97  BUN 8 - 23 mg/dL 6(L) 6(L) 7(L)  Creatinine 0.61 - 1.24 mg/dL 0.42(L) 0.44(L) 0.42(L)  Sodium 135 - 145 mmol/L 134(L) 135 137  Potassium 3.5 - 5.1 mmol/L 3.7 3.6 3.1(L)  Chloride 98 - 111 mmol/L 100 102 103  CO2 22 - 32 mmol/L 27 28 28   Calcium 8.9 - 10.3 mg/dL 8.3(L) 8.1(L) 7.9(L)  Total Protein 6.5 - 8.1 g/dL 6.3(L) 5.8(L) -  Total Bilirubin 0.3 - 1.2 mg/dL 0.7 0.4 -  Alkaline Phos 38 - 126 U/L 354(H) 788(H) -  AST 15 - 41 U/L 12(L) 17 -  ALT 0 - 44 U/L 10 21 -     Pertinent Imaging today Plain films and CT images have been personally visualized and interpreted; radiology reports have been reviewed. Decision making incorporated into the Impression / Recommendations.  I have spent approx 30 minutes for this patient encounter including review of prior medical records, coordination of care  with greater than 50% of time being face to face/counseling and discussing diagnostics/treatment plan with the patient/family.  Electronically signed by:   Rosiland Oz, MD Infectious Disease Physician [facility] Pager: [phone]

## 2020-11-23 NOTE — Patient Care Conference (Signed)
Inpatient RehabilitationTeam Conference and Plan of Care Update Date: 11/23/2020   Time: 11:06 AM    Patient Name: Colton Bush      Medical Record Number: [id number]  Date of Birth: [birth year] Sex: Male         Room/Bed: 4M12C/4M12C-01 Payor Info: Payor: MEDICARE / Plan: MEDICARE PART A AND B / Product Type: *No Product type* /    Admit Date/Time:  11/12/2020  5:52 PM  Primary Diagnosis:  Subdural hematoma [facility])  Hospital Problems: Principal Problem:   Subdural hematoma (HCC) Active Problems:   Liver abscess   Bladder outlet obstruction   Seizure in setting of subdural hematoma   Toxic encephalopathy   Pain    Expected Discharge Date: Expected Discharge Date: 12/03/20  Team Members Present: Physician leading conference: Dr. Courtney Heys Care Coodinator Present: Loralee Pacas, LCSWA;Kassius Battiste Creig Hines, RN, BSN, [place] Nurse Present: Dorthula Nettles, RN PT Present: Excell Seltzer, PT OT Present: Roanna Epley, [place], OT SLP Present: Charolett Bumpers, SLP PPS Coordinator present : Ileana Ladd, PT     Current Status/Progress Goal Weekly Team Focus  Bowel/Bladder   Foley in place, Incontinent of bowel, LBM 11/22/2020  to have more continent BMs and to eventually discontinue the foley  Timed toileting q2h   Swallow/Nutrition/ Hydration             ADL's   sit<>stand in [place]-max A; transfers via [place]; bathing-mod A; LB dressing-max A; toileting-dependent (incontinent of bowel)  CGA/supervision overall  OOB tolerance, BADL retraining, attention, functional transfers, safety awareness   Mobility   min to mod A bed mobility, max A to stand (stedy, // bars), transfers via stedy, Supervision to min A w/c mobility 100 ft  min A overall, short distance gait  sit to stand, gait initiation, LE strengthening   Communication             Safety/Cognition/ Behavioral Observations  Mod A  min-supervision A  moderately complex problem solving, recall of functional  information, judgement/safety for home management   Pain   C/o lower back pain, lidocaine patch in place.  pain >3/10  Assess pain Qshift and PRN   Skin   Closed head incision, open to air. Hip incisions x3 with staples. Left heel in boggy, Right heel is suspected deep tissue injury, both heels with foam dressing in place. 2 JP drain sites dressing in place. MASD and stage 1 pressure wound on coccyx. Abrasion to right elbow, covered with foam dressing.  Promote wound healing and prevention of infection.  Assess Skin Qshift and PRN     Discharge Planning:  D/c to home with 24/7 care from his wife. Possible SNF pending care needs. Pt is not eligible for SNF placement due to upcoming radiation treatment.   Team Discussion: Jodell Cipro out today, , CBG's are up and down, can't adjust insulin, question when to take drains out. Patient has foley, incontinent of bowel. Has mild pain, medicated with Tylenol. JP drains to the right side, unstageable to the right heel, stage I to the left heel, stage II to the sacrum. Patient will be on IV antibiotics for 6 weeks, may need to go SNF. Need chemo and radiation post discharge. Patient on target to meet rehab goals: yes, bed mobility inconsistent, today contact guard with RW, and today made progress. Some improvement but no awareness. Bed mobility supervision. SLP working on selective attention, and awareness.  *See Care Plan and progress notes for long and short-term goals.   Revisions to  Treatment Plan:  Consulting diabetes coordinator.  Teaching Needs: Family education, medication management, pain management, diabetes education, skin/wound care, dressing changes, foley care education, transfer training, gait training, balance training, endurance training, safety awareness.  Current Barriers to Discharge: Decreased caregiver support, Medical stability, Home enviroment access/layout, IV antibiotics, Incontinence, Neurogenic bowel and bladder, Wound care, Lack  of/limited family support, Medication compliance, Pending chemo/radiation, Behavior, Nutritional means and foley care.  Possible Resolutions to Barriers: Continue current medications, provide emotional support     Medical Summary Current Status: incontinent of bowel- has foley- pain controlled with tylenol- JP drains; R heel DTI? scarum Stage II; L heel stage I; variable BGs- no awareness of his deficits! Thought was going to drive when left hospital, but not even walking.  Barriers to Discharge: Medical stability;Neurogenic Bowel & Bladder;Incontinence;Decreased family/caregiver support;Home enviroment access/layout;IV antibiotics;Pending chemo/radiation;Wound care;Other (comments);Nutrition means;Medication compliance  Barriers to Discharge Comments: On IV ABX for 6 weeks. also on PO Linizolid. also has pancreatic Cancer- pain better controlled; Possible Resolutions to [employer] Focus: will consult- Dm coordinators to help with BGs. will d/w Foley with pt- to see if can remove; radiation to start 5/3- while here; main focus- foley/BGs, need to d/w Oncology- to see if can extend; scheduled 5/6   Continued Need for Acute Rehabilitation Level of Care: The patient requires daily medical management by a physician with specialized training in physical medicine and rehabilitation for the following reasons: Direction of a multidisciplinary physical rehabilitation program to maximize functional independence : Yes Medical management of patient stability for increased activity during participation in an intensive rehabilitation regime.: Yes Analysis of laboratory values and/or radiology reports with any subsequent need for medication adjustment and/or medical intervention. : Yes   I attest that I was present, lead the team conference, and concur with the assessment and plan of the team.   Cristi Loron 11/23/2020, 5:25 PM

## 2020-11-23 NOTE — Progress Notes (Addendum)
Inpatient Diabetes Program Recommendations  AACE/ADA: New Consensus Statement on Inpatient Glycemic Control   Target Ranges:  Prepandial:   less than 140 mg/dL      Peak postprandial:   less than 180 mg/dL (1-2 hours)      Critically ill patients:  140 - 180 mg/dL  Results for Colton Bush, Colton "Colton Bush" (MRN [id number]) as of 11/23/2020 11:14  Ref. Range 11/22/2020 06:09 11/22/2020 06:32 11/22/2020 11:44 11/22/2020 16:43 11/22/2020 21:22 11/23/2020 06:22 11/23/2020 06:46 11/23/2020 11:12  Glucose-Capillary Latest Ref Range: 70 - 99 mg/dL 68 (L) 105 (H) 256 (H) 215 (H) 163 (H) 67 (L) 108 (H) 291 (H)    Review of Glycemic Control  Current orders for Inpatient glycemic control: Levemir 13 units BID, Novolog 0-15 units TID with meals, Novolog 0-5 units QHS  Inpatient Diabetes Program Recommendations:    Insulin: Please consider decreasing Levemir to 11 units BID and ordering Novolog 4 units TID with meals for meal coverage if patient eats at least 50% of meals.  Addendum 11/23/20@11 :33: After communicating with Dr. Dagoberto Ligas, it was requested to provide a simplified regimen that may work better for patient once discharged. Therefore, would recommend using 70/30 insulin with BID dosing (given with breakfast and supper). Based on recommended Levemir and meal coverage as noted above, would recommend 70/30 16 units BID (dose would provide a total of 22.4 units for basal and 9.6 units for meal coverage per day).  Thanks, Barnie Alderman, RN, MSN, CDE Diabetes Coordinator Inpatient Diabetes Program [phone] (Team Pager from 8am to 5pm)

## 2020-11-23 NOTE — Significant Event (Signed)
Hypoglycemic Event  CBG: 67  Treatment:orange juice   Symptoms: none  Follow-up CBG: Time: 0646 CBG Result:108  Possible Reasons for Event: unknown   Comments/MD notified:    Celesta Aver

## 2020-11-23 NOTE — Progress Notes (Signed)
Occupational Therapy Session Note  Patient Details  Name: Colton Bush MRN: [id number] Date of Birth: 03-03-1956  Today's Date: 11/23/2020 OT Individual Time: 0700-0755 OT Individual Time Calculation (min): 55 min    Short Term Goals: Week 2:  OT Short Term Goal 1 (Week 2): P twill transfer with +1 A and LRAD ot BSC OT Short Term Goal 2 (Week 2): Pt will sit to stand consistently with MOD A in prep for clothing managment OT Short Term Goal 3 (Week 2): Pt will groom seated EOB with S  Skilled Therapeutic Interventions/Progress Updates:    Pt resting in bed upon arrival. OT intervention with focus on bed mobility, sit<>stand, standing balance, dressing at sink, grooming, attention to task, and safety awareness to increase independence with BADLs. Supine>sit EOB with supervision using bed functions. Sit<>stand in [place] with mod A. UB bathing/dressing with supervision seated in w/c at sink. Pt donned pants seated EOB requiring tot A for threading pants. Sit<>stand and standing in [place] for therapist to pull pants over hips. Pt continues with tangential conversation during session. Pt displeased with MD report that he won't be able to drive for 6-8 weeks after discharge. Discussed primary goal of walking first, pt stated that he would still able to drive. Pt with unrealistic expectations and limited awareness of deficits. Pt remained in TIS w/c with belt alarm activated. All needs within reach.   Therapy Documentation Precautions:  Restrictions Weight Bearing Restrictions: No RLE Weight Bearing: Weight bearing as tolerated Pain:  Pt reports his pain is "not bad"   Therapy/Group: Individual Therapy  [name] 11/23/2020, 8:02 AM

## 2020-11-23 NOTE — Progress Notes (Signed)
Patient ID: Colton Bush, male   DOB: 1956-03-05, 65 y.o.   MRN: [id number]  SW met with pt in room to provide updates from team conference.   [phone]- SW left message for [name] [phone]) requesting return phone call to provide team updates.   Loralee Pacas, MSW, [place] Office: [phone] Cell: [phone] Fax: [phone]

## 2020-11-23 NOTE — Progress Notes (Signed)
PROGRESS NOTE   Subjective:  Pt reports BG was low this AM again, but was high all day yesterday- R knee still somewhat bothersome, but better.  Moving better per OT.   Seen by ID- asked for pt Linizolid to switch to PO- was done.  Will follow labs.   ROS:   Pt denies SOB, abd pain, CP, N/V/C/D, and vision changes   Objective:   No results found. Recent Labs    11/22/20 0503  WBC 10.6*  HGB 9.7*  HCT 30.4*  PLT 489*   Recent Labs    11/22/20 0503  NA 134*  K 3.7  CL 100  CO2 27  GLUCOSE 66*  BUN 6*  CREATININE 0.42*  CALCIUM 8.3*    Intake/Output Summary (Last 24 hours) at 11/23/2020 0925 Last data filed at 11/23/2020 0841 Gross per 24 hour  Intake 490 ml  Output 1550 ml  Net -1060 ml     Pressure Injury 11/07/20 Heel Right Stage 1 -  Intact skin with non-blanchable redness of a localized area usually over a bony prominence. (Active)  11/07/20 0800  Location: Heel  Location Orientation: Right  Staging: Stage 1 -  Intact skin with non-blanchable redness of a localized area usually over a bony prominence.  Wound Description (Comments):   Present on Admission:      Pressure Injury 11/07/20 Sacrum Stage 2 -  Partial thickness loss of dermis presenting as a shallow open injury with a red, pink wound bed without slough. 4/15 skin is now broken; was documented as stage 1. now a stage 2. (Active)  11/07/20 2000  Location: Sacrum  Location Orientation:   Staging: Stage 2 -  Partial thickness loss of dermis presenting as a shallow open injury with a red, pink wound bed without slough.  Wound Description (Comments): 4/15 skin is now broken; was documented as stage 1. now a stage 2.  Present on Admission:      Pressure Injury 11/07/20 Heel Right Deep Tissue Pressure Injury - Purple or maroon localized area of discolored intact skin or blood-filled blister due to damage of underlying soft tissue from pressure  and/or shear. (Active)  11/07/20 2000  Location: Heel  Location Orientation: Right  Staging: Deep Tissue Pressure Injury - Purple or maroon localized area of discolored intact skin or blood-filled blister due to damage of underlying soft tissue from pressure and/or shear.  Wound Description (Comments):   Present on Admission:     Physical Exam: Vital Signs Blood pressure 125/67, pulse 91, temperature 98.9 F (37.2 C), resp. rate 18, height _0  (1.803 m), weight 83 kg, SpO2 94 %.      General: awake, alert, appropriate, sitting up in w/c at sink, OT at side; NAD HENT: conjugate gaze; oropharynx moist- crani site almost completely healed CV: regular rate; no JVD Pulmonary: CTA B/L; no W/R/R- good air movement GI: soft, NT, ND, (+)BS Psychiatric: appropriate- flat affect Neurological: Ox3  Ext: no clubbing, cyanosis, or edema Genitourinary: Penis: Normal.  Comments: Light amber urine in foley as it was placed under sterile conditions Musculoskeletal:  Cervical back: Normal range of motionand neck supple.  Comments: RLE with multiple small stapled  incisions.   UE's 5-/5 but equal B/L  LE's 4+/5 in HF, KE, DF and PF B/L  Skin: General: Skin is warmand dry.  Comments: RLE incisions as well as abd incisions already noted- staples still in place Neurological:  Mental Status: He is oriented to person, place, and time.  Comments: Soft voice with mild left facial weakness. Slow to respond but able to follow basic motor commands.   Delayed but reasonable processing and speech today Intact to light touch in all 4 extremities     Assessment/Plan: 1. Functional deficits which require 3+ hours per day of interdisciplinary therapy in a comprehensive inpatient rehab setting.  Physiatrist is providing close team supervision and 24 hour management of active medical problems listed below.  Physiatrist and rehab team continue to assess barriers to  discharge/monitor patient progress toward functional and medical goals  Care Tool:  Bathing    Body parts bathed by patient: Right arm,Left arm,Chest,Abdomen,Face   Body parts bathed by helper: Front perineal area,Buttocks,Right upper leg,Left upper leg,Right lower leg,Left lower leg     Bathing assist Assist Level: Maximal Assistance - Patient 24 - 49%     Upper Body Dressing/Undressing Upper body dressing   What is the patient wearing?: Pull over shirt    Upper body assist Assist Level: Moderate Assistance - Patient 50 - 74%    Lower Body Dressing/Undressing Lower body dressing      What is the patient wearing?: Pants     Lower body assist Assist for lower body dressing: Moderate Assistance - Patient 50 - 74%     Toileting Toileting    Toileting assist Assist for toileting: Dependent - Patient 0%     Transfers Chair/bed transfer  Transfers assist     Chair/bed transfer assist level: Maximal Assistance - Patient 25 - 49%     Locomotion Ambulation   Ambulation assist   Ambulation activity did not occur: Safety/medical concerns          Walk 10 feet activity   Assist  Walk 10 feet activity did not occur: Safety/medical concerns        Walk 50 feet activity   Assist Walk 50 feet with 2 turns activity did not occur: Safety/medical concerns         Walk 150 feet activity   Assist Walk 150 feet activity did not occur: Safety/medical concerns         Walk 10 feet on uneven surface  activity   Assist Walk 10 feet on uneven surfaces activity did not occur: Safety/medical concerns         Wheelchair     Assist Will patient use wheelchair at discharge?: Yes Type of Wheelchair: Manual Wheelchair activity did not occur: Safety/medical concerns  Wheelchair assist level: Supervision/Verbal cueing Max wheelchair distance: 100'    Wheelchair 50 feet with 2 turns activity    Assist    Wheelchair 50 feet with 2 turns  activity did not occur: Safety/medical concerns   Assist Level: Supervision/Verbal cueing   Wheelchair 150 feet activity     Assist  Wheelchair 150 feet activity did not occur: Safety/medical concerns       Blood pressure 125/67, pulse 91, temperature 98.9 F (37.2 C), resp. rate 18, height _0  (1.803 m), weight 83 kg, SpO2 94 %.  Medical Problem List and Plan: 1.L frontal craniotomysecondary to LDH/SAH from fall -patient may Shower if incisions covered -ELOS/Goals: 2-3 weeks- mod I to supervision  con't PT and OT 2. Impaired mobility: -DVT/anticoagulation: Pharmaceutical: Continue Lovenox -antiplatelet therapy: N/a 3. Low back pain: continue air mattress. Resolved. Dicussed XR findings of degeneration.   4/25- was having back pain- and R knee pain- if doesn't improve, will schedule Am pain meds  4/26- pain overall controlled- con't regimen 4. Mood: LCSW to follow for evaluation and support.  -antipsychotic agents: N/A 5. Neuropsych: This patient is capable of making decisions on his own behalf. 6. Multiple deep tissue injuries/Skin/Wound Care: Continue Air mattress overlay with pressure relief measures.  --Continue vitamins and collagen to promote wound healing  7. Fluids/Electrolytes/Nutrition: Monitor I/O. Check lytes in am. 8. SDH (concerns of bacterial infection): S/p Burr hole evacuation  9. Strep anginosis bacteremia: Continue Ceftriaxone/Metronidazole X 6 weeks w/end date  10 Liver abscess s/p JP drain placement: Continue flushes TID and document output.   -IR is following. 11. New onset seizures: Continue Keppra bid till follow up with neuro on outpatient basis.  12. Left hip ORIF: WBAT. Will consult ortho to eval given worsening pain. Added MS contin for better pain control/participation with therapy. Appears to have helped, continue.   4/26- will remove staples 13. T2DM: Hgb A1C-  10.8. Will monitor BS ac/hs. Home regimen Levemir 10u am/15u pm with SSI.  --continue Levemir 13u bid with SSI and titrate insulin as indicated.             -4/23 improved control  4/25- BGs 68-256 in last 24 hours- much more labile today- if still an issue tomorrow, will ask DM coordinators.   4/26- will consult DM coordinators 14. BPH/Urinary retention: Foley replaced--keep in place for decompression for few days then start bladder program next week?.  --will increase flomax to 0.8 mg/HS  15. Pancreatic cancer with recurrence: Continue Creon with meals. --follow up with Hem/onc and Radiation Onc after discharge.  -wife asking about plans for radiation/chemo: will contact oncology to ask them to discuss with wife.  16. Low back pain/muscle spasms- will start Robaxin prn and lidoderm patches 1- 8pm to 8am.              4/16: denies pain: decrease robaxin to q12H prn.  17. Hypokalemia: scheduled supplement and repeat BMP Monday 18. Overweight BMI 25.52: provide counseling.  19. Elevated alk phos: increasing,  Oncology notified. Oncology has discussed plans with wife for post- CIR follow-up.  20. Right sided pleural effusion: could be contributing to his chest pain. Consulted IR regarding drainage/pleural studies given active cancer. ADDENDUM: s/p successful thoracentesis  -pt denies any SOB  4/25- VRE grew from Pleural effusion- ID to see today- linezolid started.    4/26- seen by ID- will con't Linizolid.   LOS: 11 days A FACE TO FACE EVALUATION WAS PERFORMED  Jerrit Horen 11/23/2020, 9:25 AM

## 2020-11-23 NOTE — Progress Notes (Signed)
Physical Therapy Session Note  Patient Details  Name: Colton Bush MRN: [id number] Date of Birth: [birth year]  Today's Date: 11/23/2020 PT Individual Time: 0900-1000; 1400-1500 PT Individual Time Calculation (min): 60 min and 60 min  Short Term Goals: Week 2:  PT Short Term Goal 1 (Week 2): Pt will perform least restrictive transfer with mod A consistently PT Short Term Goal 2 (Week 2): Pt will initiate gait training PT Short Term Goal 3 (Week 2): Pt will perform bed mobility with min A consistently  Skilled Therapeutic Interventions/Progress Updates:    Session 1: Pt received seated in TIS chair in room, agreeable to PT session. Pt has intermittent RLE pain with mobility that improves at rest. Squat pivot transfer w/c to mat table with mod A. Sit to stand with CGA to RW from elevated mat table! Standing alt L/R marches 2 x 10 reps with RW and min A for balance with R knee blocked in stance. Pt initially has trouble with WBing on RLE, improves as session progresses. Standing alt L/R 1" step-taps with RW and min A for balance, fair tolerance for stance on RLE. Sidesteps L/R 3 x 10 ft with RW and min A for balance. Stand pivot transfer mat table to w/c with RW and min A to the R. Sit to stand with max A from lower seat of w/c to RW. Stand pivot transfer to bed with RW and min A to the R. Sit to supine Supervision. Pt left seated in bed with needs in reach at end of session.  Session 2: Pt received seated in bed, agreeable to PT session. No complaints of pain at rest. Supine to sit with Supervision with HOB elevated and use of bedrail. Sit to stand with mod A to RW. Stand pivot transfer to TIS with RW and mod A to the L. Sit to stand with mod A from TIS to RW, stand pivot transfer to mat table with min A to the R with RW. Sit to stand with CGA from elevated mat table. Standing alt L/R marches 2 x 10 reps with RW and min A for balance. Standing mini-squats x 10 reps, x 4 reps with RW and min A for  balance. Pt exhibits decreased ability to reach full upright posture and extend knees and trunk with onset of fatigue. Ambulation 2 x 10 ft, 1 x 5 ft with RW and mod A for balance with close w/c follow from a 2nd person for safety. Pt exhibits antalgic gait pattern with decreased tolerance for stance on RLE, R knee blocked in stance for stability. Pt also tends to walk with flexed trunk, requires cues for upright posture. Pt has one near LOB during last bout of ambulation, mod A to recover and safely sit in w/c. Pt left seated in w/c in room with needs in reach at end of session, quick release belt and chair alarm in place.  Therapy Documentation Precautions:  Restrictions Weight Bearing Restrictions: No RLE Weight Bearing: Weight bearing as tolerated   Therapy/Group: Individual Therapy   Excell Seltzer, PT, DPT, CSRS  11/23/2020, 4:05 PM

## 2020-11-24 ENCOUNTER — Telehealth: Payer: Self-pay | Admitting: Hematology

## 2020-11-24 ENCOUNTER — Ambulatory Visit: Payer: Medicare Other | Admitting: Radiation Oncology

## 2020-11-24 LAB — GLUCOSE, CAPILLARY
Glucose-Capillary: 87 mg/dL (ref 70–99)
Glucose-Capillary: 186 mg/dL — ABNORMAL HIGH (ref 70–99)
Glucose-Capillary: 171 mg/dL — ABNORMAL HIGH (ref 70–99)
Glucose-Capillary: 226 mg/dL — ABNORMAL HIGH (ref 70–99)
Glucose-Capillary: 95 mg/dL (ref 70–99)

## 2020-11-24 MED ORDER — INSULIN ASPART PROT & ASPART (70-30 MIX) 100 UNIT/ML ~~LOC~~ SUSP
16.0000 [IU] | Freq: Two times a day (BID) | SUBCUTANEOUS | Status: DC
  Administered 2020-11-24 – 2020-11-28 (×9): 16 [IU] via SUBCUTANEOUS
  Filled 2020-11-24: qty 10

## 2020-11-24 NOTE — Progress Notes (Signed)
Physical Therapy Session Note  Patient Details  Name: Colton Bush MRN: [id number] Date of Birth: Dec 23, 1955  Today's Date: 11/24/2020 PT Individual Time: 0801-0900 PT Individual Time Calculation (min): 59 min   Short Term Goals: Week 2:  PT Short Term Goal 1 (Week 2): Pt will perform least restrictive transfer with mod A consistently PT Short Term Goal 2 (Week 2): Pt will initiate gait training PT Short Term Goal 3 (Week 2): Pt will perform bed mobility with min A consistently  Skilled Therapeutic Interventions/Progress Updates: Pt presents supine in bed and agreeable to therapy.  Pt performed sup to R sidelying to sitting at EOB w/ supervision, but slowly and verbal cues for breathing technique.  Pt transferred sit to stand w/ min A from multiple surfaces, w/c, mat table, bed.  Pt required verbal cues for hand placement.  Pt transferred SPT to TIS w/ min A.  Pt wheeled to main gym for time conservation.  Pt performed transfer to mat table w/ RW and min A.  Pt performed standing toetaps to 1" platform w/ min A and seated rest breaks between trials.  Pt performed standing Lego trial to utilize BUEs but unable to continue standing.  Pt completed in sitting.  Pt transferred sit to stand w/ mod/min A and SPT w/ RW and min A to w/c.  Noted diaphoresis and HR of 104, O2 sats of 97%.  Pt had no c/o.  Returned to room and BP taken of 95/61.  Nsg notified and BS taken at 189.  Cool washcloth given and TIS reclined.  Pt remained sitting in TIS w/ seat alarm on.  Pt had no c/o.     Therapy Documentation Precautions:  Restrictions Weight Bearing Restrictions: No RLE Weight Bearing: Weight bearing as tolerated General:   Vital Signs:   Pain:2/10 R knee w/ WB, c/o some neck soreness w/ sitting. Pain Assessment Pain Score: 0-No pain Mobility:     Therapy/Group: Individual Therapy  Colton Bush 11/24/2020, 9:04 AM

## 2020-11-24 NOTE — Progress Notes (Signed)
Speech Language Pathology Daily Session Note  Patient Details  Name: Colton Bush MRN: [id number] Date of Birth: 22-Oct-1955  Today's Date: 11/24/2020 SLP Individual Time: 0915-1000 SLP Individual Time Calculation (min): 45 min  Short Term Goals: Week 2: SLP Short Term Goal 1 (Week 2): Pt will complete moderately complex problem solving tasks with min A verbal cues SLP Short Term Goal 2 (Week 2): Pt will increase short term recall of novel/functional information with Supervision A cues SLP Short Term Goal 3 (Week 2): Pt will increase judgement/safety for home management tasks and ADLs with min A cues to recall current physical impairments  Skilled Therapeutic Interventions: Skilled SLP intervention focused on cognition. Pt completed moderately complex deductive reasoning puzzle with mod A for reasoning solutions and processing of elimination with less direct clues. Pt identified 4/8 errors in a printed calendar with Min A visual cues. Increased redirection needed during tasks this session and increased  time needed with processing written information. Pt  Reported he was worried about his wife and meeting with oncologist this evening. Pt left seated upright in chair with chair alarm set and call button within reach. Cont with therapy per plan of care.   Pain Pain Assessment Pain Scale: 0-10 Pain Score: 3  Pain Type: Acute pain Pain Location: Generalized Pain Descriptors / Indicators: Aching Pain Frequency: Intermittent Pain Onset: Gradual Patients Stated Pain Goal: 0 Pain Intervention(s): Medication (See eMAR);Repositioned  Therapy/Group: Individual Therapy  Colton Bush 11/24/2020, 12:12 PM

## 2020-11-24 NOTE — Progress Notes (Signed)
Supervising Physician: Dr. Annamaria Boots  Patient Status:  Colton Bush - In-pt  Chief Complaint:  Hepatic abscess drain x 2 placed 11/06/20 with Dr. Earleen Newport   Subjective:  Patient sitting on a recline, not in acute distress.  States that he is doing well.  Denies fever, chills, abdominal pain, nausea, vomiting, diarrhea.   Allergies: Patient has no known allergies.  Medications:  Current Facility-Administered Medications:  .  0.9 %  sodium chloride infusion, , Intravenous, PRN, Courtney Heys, MD, Stopped at 11/23/20 2154 .  acetaminophen (TYLENOL) tablet 325-650 mg, 325-650 mg, Oral, Q4H PRN, Williamsen, Pamela S, PA-C, 650 mg at 11/24/20 0600 .  alum & mag hydroxide-simeth (MAALOX/MYLANTA) 200-200-20 MG/5ML suspension 30 mL, 30 mL, Oral, Q4H PRN, Pippenger, Pamela S, PA-C .  ascorbic acid (VITAMIN C) tablet 500 mg, 500 mg, Oral, Daily, Freel, Pamela S, PA-C, 500 mg at 11/24/20 3532 .  bisacodyl (DULCOLAX) suppository 10 mg, 10 mg, Rectal, Daily PRN, Clarin, Pamela S, PA-C .  cefTRIAXone (ROCEPHIN) 2 g in sodium chloride 0.9 % 100 mL IVPB, 2 g, Intravenous, Q12H, Vitale, Pamela S, PA-C, Last Rate: 200 mL/hr at 11/24/20 0932, 2 g at 11/24/20 0932 .  Chlorhexidine Gluconate Cloth 2 % PADS 6 each, 6 each, Topical, BID, Raulkar, Clide Deutscher, MD, 6 each at 11/24/20 0537 .  diclofenac Sodium (VOLTAREN) 1 % topical gel 2 g, 2 g, Topical, QID, Swallows, Pamela S, PA-C, 2 g at 11/24/20 0933 .  diphenhydrAMINE (BENADRYL) 12.5 MG/5ML elixir 12.5-25 mg, 12.5-25 mg, Oral, Q6H PRN, Clubb, Pamela S, PA-C .  enoxaparin (LOVENOX) injection 40 mg, 40 mg, Subcutaneous, Q24H, Dorrough, Pamela S, PA-C, 40 mg at 11/23/20 1957 .  guaiFENesin-dextromethorphan (ROBITUSSIN DM) 100-10 MG/5ML syrup 5-10 mL, 5-10 mL, Oral, Q6H PRN, Prange, Pamela S, PA-C .  HYDROcodone-acetaminophen (NORCO/VICODIN) 5-325 MG per tablet 1 tablet, 1 tablet, Oral, Q3H PRN, Sciuto, Pamela S, PA-C, 1 tablet at 11/23/20 1142 .  insulin aspart (novoLOG) injection 0-15 Units,  0-15 Units, Subcutaneous, TID WC, Shampine, Ivan Anchors, PA-C, 3 Units at 11/23/20 1841 .  insulin aspart (novoLOG) injection 0-5 Units, 0-5 Units, Subcutaneous, QHS, Havlik, Pamela S, PA-C, 3 Units at 11/23/20 2155 .  insulin aspart protamine- aspart (NOVOLOG MIX 70/30) injection 16 Units, 16 Units, Subcutaneous, BID WC, Lovorn, Megan, MD .  levalbuterol (XOPENEX) nebulizer solution 0.63 mg, 0.63 mg, Nebulization, Q6H PRN, Lovorn, Megan, MD .  levETIRAcetam (KEPPRA) tablet 1,000 mg, 1,000 mg, Oral, BID, Doten, Pamela S, PA-C, 1,000 mg at 11/24/20 0927 .  lidocaine (LIDODERM) 5 % 1 patch, 1 patch, Transdermal, Q24H, Lovorn, Megan, MD, 1 patch at 11/23/20 1954 .  linezolid (ZYVOX) tablet 600 mg, 600 mg, Oral, Q12H, Rosiland Oz, MD, 600 mg at 11/24/20 0927 .  lipase/protease/amylase (CREON) capsule 12,000 Units, 12,000 Units, Oral, TID AC, Hampshire, Pamela S, PA-C, 12,000 Units at 11/24/20 0600 .  loratadine (CLARITIN) tablet 10 mg, 10 mg, Oral, Daily, Shiplett, Pamela S, PA-C, 10 mg at 11/24/20 9924 .  MEDLINE mouth rinse, 15 mL, Mouth Rinse, BID, Moris, Ivan Anchors, PA-C, 15 mL at 11/24/20 0934 .  methocarbamol (ROBAXIN) tablet 500 mg, 500 mg, Oral, Q12H PRN, Raulkar, Clide Deutscher, MD, 500 mg at 11/21/20 1029 .  metroNIDAZOLE (FLAGYL) tablet 500 mg, 500 mg, Oral, Q8H, Mumaw, Pamela S, PA-C, 500 mg at 11/24/20 0600 .  morphine (MS CONTIN) 12 hr tablet 30 mg, 30 mg, Oral, Q12H, Vossler, Pamela S, PA-C, 30 mg at 11/24/20 0600 .  pantoprazole (PROTONIX) EC  tablet 40 mg, 40 mg, Oral, Q1200, Saran, Pamela S, PA-C, 40 mg at 11/23/20 1142 .  polycarbophil (FIBERCON) tablet 625 mg, 625 mg, Oral, BID, Blouch, Ivan Anchors, PA-C, 625 mg at 11/24/20 2683 .  polyethylene glycol (MIRALAX / GLYCOLAX) packet 17 g, 17 g, Oral, Daily PRN, Brandon, Pamela S, PA-C .  potassium chloride (KLOR-CON) packet 40 mEq, 40 mEq, Oral, BID, Raulkar, Clide Deutscher, MD, 40 mEq at 11/24/20 0927 .  prochlorperazine (COMPAZINE) tablet 5-10 mg, 5-10 mg, Oral, Q6H PRN  **OR** prochlorperazine (COMPAZINE) injection 5-10 mg, 5-10 mg, Intramuscular, Q6H PRN **OR** prochlorperazine (COMPAZINE) suppository 12.5 mg, 12.5 mg, Rectal, Q6H PRN, Sak, Pamela S, PA-C .  sodium chloride flush (NS) 0.9 % injection 10 mL, 10 mL, Intracatheter, Q24H, Covington, Jamie R, NP, 10 mL at 11/23/20 1300 .  sodium phosphate (FLEET) 7-19 GM/118ML enema 1 enema, 1 enema, Rectal, Once PRN, Nordquist, Pamela S, PA-C .  tamsulosin (FLOMAX) capsule 0.8 mg, 0.8 mg, Oral, QPC supper, Mattes, Pamela S, PA-C, 0.8 mg at 11/23/20 1841 .  traZODone (DESYREL) tablet 50 mg, 50 mg, Oral, QHS PRN, Raulkar, Clide Deutscher, MD .  zinc sulfate capsule 220 mg, 220 mg, Oral, Daily, Beverley, Pamela S, PA-C, 220 mg at 11/24/20 4196  Facility-Administered Medications Ordered in Other Encounters:  .  sodium chloride 0.9 % injection 10 mL, 10 mL, Intravenous, PRN, Truitt Merle, MD, 10 mL at 02/09/17 1111    Vital Signs: BP 136/68 (BP Location: Left Arm)   Pulse 88   Temp 99 F (37.2 C)   Resp 16   Ht 5\' 11"  (1.803 m)   Wt 83 kg   SpO2 93%   BMI 25.52 kg/m   Physical Exam Vitals reviewed.  Constitutional:      General: He is not in acute distress. HENT:     Head: Normocephalic and atraumatic.  Cardiovascular:     Rate and Rhythm: Normal rate.  Pulmonary:     Effort: Pulmonary effort is normal. No respiratory distress.  Abdominal:     General: Abdomen is flat.     Palpations: Abdomen is soft.     Comments: #1 Superior RUQ drain: 20 ml of dark green, bilious fluid  #2 Inferior RUQ drain: Scant serosanguinous fluid    Neurological:     Mental Status: He is alert.     Imaging: No results found.  Labs:  CBC: Recent Labs    11/10/20 0829 11/13/20 0447 11/15/20 0519 11/22/20 0503  WBC 11.0* 10.5 7.9 10.6*  HGB 10.9* 10.0* 9.9* 9.7*  HCT 33.5* 30.6* 30.7* 30.4*  PLT 469* 404* 470* 489*    COAGS: Recent Labs    10/19/20 0259 10/24/20 0814 11/05/20 1930 11/06/20 0337  INR 1.4* 1.2 1.5*  1.6*  APTT  --   --  32 36    BMP: Recent Labs    04/14/20 0925 07/09/20 0825 11/15/20 0519 11/16/20 0525 11/17/20 0524 11/22/20 0503  NA 141   < > 139 137 135 134*  K 3.3*   < > 3.2* 3.1* 3.6 3.7  CL 107   < > 105 103 102 100  CO2 27   < > 28 28 28 27   GLUCOSE 73   < > 215* 97 107* 66*  BUN 9   < > 8 7* 6* 6*  CALCIUM 8.8*   < > 8.1* 7.9* 8.1* 8.3*  CREATININE 0.71   < > 0.48* 0.42* 0.44* 0.42*  GFRNONAA >60   < > >60 >  60 >60 >60  GFRAA >60  --   --   --   --   --    < > = values in this interval not displayed.    LIVER FUNCTION TESTS: Recent Labs    11/13/20 0447 11/15/20 0519 11/17/20 0524 11/22/20 0503  BILITOT 0.9 0.5 0.4 0.7  AST 20 24 17  12*  ALT 20 23 21 10   ALKPHOS 270* 660* 788* 354*  PROT 5.9* 6.0* 5.8* 6.3*  ALBUMIN 1.7* 1.7* 1.8* 1.9*    Assessment and Plan: Hepatic abscess drain x 2 placed 11/06/20 with Dr. Earleen Newport.  Patient stable, has no abdominal complaints today.  Afebrile overnight.  Output has dropped off significantly, especially from drain #2. Drains have been in nearly 3 weeks. Given low output and overall improved clinical status, will order follow up CT for tomorrow to reassess.   Further treatment plan per primary team. Aprreciate and agree with the plan.  IR to follow.    Electronically Signed: Ascencion Dike, PA-C 11/24/2020, 10:06 AM   I spent a total of 15 Minutes at the the patient's bedside AND on the patient's hospital floor or unit, greater than 50% of which was counseling/coordinating care for RUQ abscess drains x 2

## 2020-11-24 NOTE — Progress Notes (Addendum)
Patient ID: Colton Bush, male   DOB: Sep 02, 1955, 65 y.o.   MRN: [id number]  SW left message for [name] [phone]) requesting return phone call to provide team updates.   SW tried home [phone] to reach pt wife. No answer, and left message. SW will continue to make efforts to make contact with pt wife.   *SW met with pt and [name] in room to discuss reports from family education yesterday. SW did inform pt does require some physical assistance, as well as will d/c on IV antibiotics. His wife was concerned about pt requiring antibiotics. SW explained d/c with [facility] and IV abx process. Pt and wife prefer home infusion company [facility]. No [facility] preference. Family education scheduled for Monday (5/1) 10am-12pm for her to allow to see progress pt has made in rehab, and will allow them to make a decision on discharge plan.   SW left message for Mickel Baas [phone]/f:[phone]) who reports she is out 4/27-4/29 and to follow-up with Allena Earing (p:[phone]/f:[phone]). SW spoke with [place] to discuss referral. SW to fax over: demo, H&P, recent ID note, labs, orders for [facility] (PICC, education, infusion needs, and follow-up). Request for weekly labs on order for CBC, BMP to be faxed to Dr. Tommy Medal (p:[phone]). SW waiting on follow-up on costs for medicaiton.  HHPT/OT/SN (wound care/ostomry care)/aide referral sent to [facility] and waiting on follow-up.   *Pt will have radiation treatment next week.   Loralee Pacas, MSW, [place] Office: [phone] Cell: [phone] Fax: [phone]

## 2020-11-24 NOTE — Progress Notes (Signed)
Occupational Therapy Session Note  Patient Details  Name: Colton Bush MRN: [id number] Date of Birth: 06-15-1956  Today's Date: 11/24/2020 OT Individual Time: 1400-1443 OT Individual Time Calculation (min): 43 min    Short Term Goals: Week 1:  OT Short Term Goal 1 (Week 1): P twill transfer with +1 A and LRAD ot BSC OT Short Term Goal 1 - Progress (Week 1): Progressing toward goal OT Short Term Goal 2 (Week 1): Pt will sit to stand consistently with MOD A in prep for clothing managment OT Short Term Goal 2 - Progress (Week 1): Progressing toward goal OT Short Term Goal 3 (Week 1): Pt will don shirt iwht MIN A OT Short Term Goal 3 - Progress (Week 1): Met OT Short Term Goal 4 (Week 1): Pt will groom seated EOB with S OT Short Term Goal 4 - Progress (Week 1): Progressing toward goal Week 2:  OT Short Term Goal 1 (Week 2): P twill transfer with +1 A and LRAD ot BSC OT Short Term Goal 2 (Week 2): Pt will sit to stand consistently with MOD A in prep for clothing managment OT Short Term Goal 3 (Week 2): Pt will groom seated EOB with S   Skilled Therapeutic Interventions/Progress Updates:    Pt greeted at time of session sitting up in Anna chair with wife present who remained throughout session. Wife initially had questions at beginning of session about CLOF, DC planning, home health follow up, etc and questions answered but recommended speaking with SW/MD/nursing for further questions. Focus on sit <> stands from wheelchair level at [facility] with Mod A for power up and Min/CGA for standing balance, lateral weight shifting and marching in place. Stand pivot wheelchair > bed Mod A for power up and Min A for transfer. Sit > supine CGA and call bell in reach all needs met.    Therapy Documentation Precautions:  Restrictions Weight Bearing Restrictions: No RLE Weight Bearing: Weight bearing as tolerated   Therapy/Group: Individual Therapy  Viona Gilmore 11/24/2020, 7:29 AM

## 2020-11-24 NOTE — Telephone Encounter (Signed)
Cancelled appt per 4/26 sch msg. Pt's wife is aware.

## 2020-11-24 NOTE — Progress Notes (Signed)
PROGRESS NOTE   Subjective:  Pt reports feeling a lot better- walked with RW- 25 ft max.   Willing to go d/c foley- wants to try to get it out.    ROS:   Pt denies SOB, abd pain, CP, N/V/C/D, and vision changes   Objective:   No results found. Recent Labs    11/22/20 0503  WBC 10.6*  HGB 9.7*  HCT 30.4*  PLT 489*   Recent Labs    11/22/20 0503  NA 134*  K 3.7  CL 100  CO2 27  GLUCOSE 66*  BUN 6*  CREATININE 0.42*  CALCIUM 8.3*    Intake/Output Summary (Last 24 hours) at 11/24/2020 0749 Last data filed at 11/24/2020 0600 Gross per 24 hour  Intake 610 ml  Output 1175 ml  Net -565 ml     Pressure Injury 11/07/20 Heel Right Stage 1 -  Intact skin with non-blanchable redness of a localized area usually over a bony prominence. (Active)  11/07/20 0800  Location: Heel  Location Orientation: Right  Staging: Stage 1 -  Intact skin with non-blanchable redness of a localized area usually over a bony prominence.  Wound Description (Comments):   Present on Admission:      Pressure Injury 11/07/20 Sacrum Stage 2 -  Partial thickness loss of dermis presenting as a shallow open injury with a red, pink wound bed without slough. 4/15 skin is now broken; was documented as stage 1. now a stage 2. (Active)  11/07/20 2000  Location: Sacrum  Location Orientation:   Staging: Stage 2 -  Partial thickness loss of dermis presenting as a shallow open injury with a red, pink wound bed without slough.  Wound Description (Comments): 4/15 skin is now broken; was documented as stage 1. now a stage 2.  Present on Admission:      Pressure Injury 11/07/20 Heel Right Deep Tissue Pressure Injury - Purple or maroon localized area of discolored intact skin or blood-filled blister due to damage of underlying soft tissue from pressure and/or shear. (Active)  11/07/20 2000  Location: Heel  Location Orientation: Right  Staging: Deep Tissue  Pressure Injury - Purple or maroon localized area of discolored intact skin or blood-filled blister due to damage of underlying soft tissue from pressure and/or shear.  Wound Description (Comments):   Present on Admission:     Physical Exam: Vital Signs Blood pressure 136/68, pulse 88, temperature 99 F (37.2 C), resp. rate 16, height _0  (1.803 m), weight 83 kg, SpO2 93 %.       General: awake, alert, appropriate, sitting up in bed; gaunt/frail appearing;NAD HENT: conjugate gaze; oropharynx moist CV: regular rate; no JVD Pulmonary: CTA B/L; no W/R/R- good air movement GI: soft, NT, ND, (+)BS- JP drains in place Psychiatric: appropriate Neurological: Ox3  Ext: no clubbing, cyanosis, or edema Genitourinary: foley in place Musculoskeletal:  Cervical back: Normal range of motionand neck supple.  Comments: RLE with multiple small stapled incisions.   UE's 5-/5 but equal B/L  LE's 4+/5 in HF, KE, DF and PF B/L  Skin: General: Skin is warmand dry.  Comments: RLE incisions as well as abd incisions already noted- staples  still in place Neurological:  Mental Status: He is oriented to person, place, and time.  Comments: Soft voice with mild left facial weakness. Slow to respond but able to follow basic motor commands.   Delayed but reasonable processing and speech today Intact to light touch in all 4 extremities     Assessment/Plan: 1. Functional deficits which require 3+ hours per day of interdisciplinary therapy in a comprehensive inpatient rehab setting.  Physiatrist is providing close team supervision and 24 hour management of active medical problems listed below.  Physiatrist and rehab team continue to assess barriers to discharge/monitor patient progress toward functional and medical goals  Care Tool:  Bathing    Body parts bathed by patient: Right arm,Left arm,Chest,Abdomen,Face   Body parts bathed by helper: Front perineal  area,Buttocks,Right upper leg,Left upper leg,Right lower leg,Left lower leg     Bathing assist Assist Level: Maximal Assistance - Patient 24 - 49%     Upper Body Dressing/Undressing Upper body dressing   What is the patient wearing?: Pull over shirt    Upper body assist Assist Level: Moderate Assistance - Patient 50 - 74%    Lower Body Dressing/Undressing Lower body dressing      What is the patient wearing?: Pants     Lower body assist Assist for lower body dressing: Moderate Assistance - Patient 50 - 74%     Toileting Toileting    Toileting assist Assist for toileting: Dependent - Patient 0%     Transfers Chair/bed transfer  Transfers assist     Chair/bed transfer assist level: Maximal Assistance - Patient 25 - 49%     Locomotion Ambulation   Ambulation assist   Ambulation activity did not occur: Safety/medical concerns  Assist level: 2 helpers Assistive device: Walker-rolling Max distance: 10'   Walk 10 feet activity   Assist  Walk 10 feet activity did not occur: Safety/medical concerns  Assist level: 2 helpers Assistive device: Walker-rolling   Walk 50 feet activity   Assist Walk 50 feet with 2 turns activity did not occur: Safety/medical concerns         Walk 150 feet activity   Assist Walk 150 feet activity did not occur: Safety/medical concerns         Walk 10 feet on uneven surface  activity   Assist Walk 10 feet on uneven surfaces activity did not occur: Safety/medical concerns         Wheelchair     Assist Will patient use wheelchair at discharge?: Yes Type of Wheelchair: Manual Wheelchair activity did not occur: Safety/medical concerns  Wheelchair assist level: Supervision/Verbal cueing Max wheelchair distance: 100'    Wheelchair 50 feet with 2 turns activity    Assist    Wheelchair 50 feet with 2 turns activity did not occur: Safety/medical concerns   Assist Level: Supervision/Verbal cueing    Wheelchair 150 feet activity     Assist  Wheelchair 150 feet activity did not occur: Safety/medical concerns       Blood pressure 136/68, pulse 88, temperature 99 F (37.2 C), resp. rate 16, height _0  (1.803 m), weight 83 kg, SpO2 93 %.  Medical Problem List and Plan: 1.L frontal craniotomysecondary to LDH/SAH from fall -patient may Shower if incisions covered -ELOS/Goals: 2-3 weeks- mod I to supervision           con't PT and OT 2. Impaired mobility: -DVT/anticoagulation: Pharmaceutical: Continue Lovenox -antiplatelet therapy: N/a 3. Low back pain: continue air mattress. Resolved. Dicussed XR findings of  degeneration.   4/25- was having back pain- and R knee pain- if doesn't improve, will schedule Am pain meds  4/27- pain controlled- con't PT and OT 4. Mood: LCSW to follow for evaluation and support.  -antipsychotic agents: N/A 5. Neuropsych: This patient is capable of making decisions on his own behalf. 6. Multiple deep tissue injuries/Skin/Wound Care: Continue Air mattress overlay with pressure relief measures.  --Continue vitamins and collagen to promote wound healing  7. Fluids/Electrolytes/Nutrition: Monitor I/O. Check lytes in am. 8. SDH (concerns of bacterial infection): S/p Burr hole evacuation  9. Strep anginosis bacteremia: Continue Ceftriaxone/Metronidazole X 6 weeks w/end date  10 Liver abscess s/p JP drain placement: Continue flushes TID and document output.   -IR is following. 11. New onset seizures: Continue Keppra bid till follow up with neuro on outpatient basis.  12. Left hip ORIF: WBAT. Will consult ortho to eval given worsening pain. Added MS contin for better pain control/participation with therapy. Appears to have helped, continue.   4/26- will remove staples 13. T2DM: Hgb A1C- 10.8. Will monitor BS ac/hs. Home regimen Levemir 10u am/15u pm with SSI.  --continue  Levemir 13u bid with SSI and titrate insulin as indicated.             -4/23 improved control  4/25- BGs 68-256 in last 24 hours- much more labile today- if still an issue tomorrow, will ask DM coordinators.   4/26- will consult DM coordinators  4/27- they suggested 70/30 16 units BID- so don't have to use novolog.  14. BPH/Urinary retention: Foley replaced--keep in place for decompression for few days then start bladder program next week?.  --will increase flomax to 0.8 mg/HS  4/27- will d/c foley- and cath if needed.   15. Pancreatic cancer with recurrence: Continue Creon with meals. --follow up with Hem/onc and Radiation Onc after discharge.  -wife asking about plans for radiation/chemo: will contact oncology to ask them to discuss with wife.  16. Low back pain/muscle spasms- will start Robaxin prn and lidoderm patches 1- 8pm to 8am.              4/16: denies pain: decrease robaxin to q12H prn.  17. Hypokalemia: scheduled supplement and repeat BMP Monday 18. Overweight BMI 25.52: provide counseling.  19. Elevated alk phos: increasing,  Oncology notified. Oncology has discussed plans with wife for post- CIR follow-up.  20. Right sided pleural effusion: could be contributing to his chest pain. Consulted IR regarding drainage/pleural studies given active cancer. ADDENDUM: s/p successful thoracentesis  -pt denies any SOB  4/25- VRE grew from Pleural effusion- ID to see today- linezolid started.    4/26- seen by ID- will con't Linizolid.   LOS: 12 days A FACE TO FACE EVALUATION WAS PERFORMED  Colton Bush 11/24/2020, 7:49 AM

## 2020-11-24 NOTE — Progress Notes (Signed)
Physical Therapy Session Note  Patient Details  Name: Colton Bush MRN: [id number] Date of Birth: 1956/06/20  Today's Date: 11/24/2020 PT Individual Time: 1003-1044 PT Individual Time Calculation (min): 41 min   Short Term Goals: Week 2:  PT Short Term Goal 1 (Week 2): Pt will perform least restrictive transfer with mod A consistently PT Short Term Goal 2 (Week 2): Pt will initiate gait training PT Short Term Goal 3 (Week 2): Pt will perform bed mobility with min A consistently  Skilled Therapeutic Interventions/Progress Updates:  Patient seated in TIS w/c on entrance to room. Patient alert and agreeable to PT session although very anxious and relating that wife is also anxious re: pt's hospitalization and other things happening at home. Educated pt that higher anxiety can increase pt's pain levels and guided in abdominal breathing for anxiety management.  Patient with no new pain complaint during session.   Therapeutic Activity: Transfers: Patient performed STS and SPVT transfers with Min A. Provided verbal cues for push-to-stand technique.   Gait Training:  Patient ambulated 12 feet using RW with CGA. Demonstrated flexed posture, decreased step height/ length, decreased gait velocity. Provided vc/ tc for increasing step height, upright posture, level gaze.   Neuromuscular Re-ed: NMR facilitated during session with focus on static and dynamic standing balance. Pt guided in toe touches to 4' step first 1x10 with RLE then LLE and progressing to reciprocation. Reciprocated stepping very slow to alternate weight shift and complete lift to step requiring extra time to complete. Requires up to Min A to complete for balance.  NMR performed for improvements in motor control and coordination, balance, sequencing, judgement, and self confidence/ efficacy in performing all aspects of mobility at highest level of independence.   Patient seated in TIS w/c at end of session with brakes locked, seat  alarm set, and all needs within reach.   Therapy Documentation Precautions:  Restrictions Weight Bearing Restrictions: No RLE Weight Bearing: Weight bearing as tolerated  Therapy/Group: Individual Therapy  Alger Simons PT, DPT 11/24/2020, 5:39 PM

## 2020-11-25 ENCOUNTER — Inpatient Hospital Stay (HOSPITAL_COMMUNITY): Payer: Medicare Other

## 2020-11-25 ENCOUNTER — Ambulatory Visit: Payer: Medicare Other | Admitting: Radiation Oncology

## 2020-11-25 ENCOUNTER — Ambulatory Visit: Payer: Medicare Other | Admitting: Hematology

## 2020-11-25 LAB — GLUCOSE, CAPILLARY
Glucose-Capillary: 107 mg/dL — ABNORMAL HIGH (ref 70–99)
Glucose-Capillary: 111 mg/dL — ABNORMAL HIGH (ref 70–99)
Glucose-Capillary: 217 mg/dL — ABNORMAL HIGH (ref 70–99)
Glucose-Capillary: 165 mg/dL — ABNORMAL HIGH (ref 70–99)

## 2020-11-25 MED ORDER — LIDOCAINE HCL URETHRAL/MUCOSAL 2 % EX GEL
1.0000 "application " | CUTANEOUS | Status: DC | PRN
  Filled 2020-11-25: qty 11

## 2020-11-25 MED ORDER — IOHEXOL 300 MG/ML  SOLN
100.0000 mL | Freq: Once | INTRAMUSCULAR | Status: AC | PRN
  Administered 2020-11-25: 100 mL via INTRAVENOUS

## 2020-11-25 NOTE — Progress Notes (Signed)
PROGRESS NOTE   Subjective:  Pt reports peed a little this AM when had BM on toilet, however nothing else- was cathed last night for 1000cc- after 6 hours- however is on max dose of Flomax.   Pt going for CT of Abd/pelvis this AM with contrast for f/u on drains- 1 still putting out a significant amount- the other not much.    ROS:   Pt denies SOB, abd pain, CP, N/V/C/D, and vision changes   Objective:   No results found. No results for input(s): WBC, HGB, HCT, PLT in the last 72 hours. No results for input(s): NA, K, CL, CO2, GLUCOSE, BUN, CREATININE, CALCIUM in the last 72 hours.  Intake/Output Summary (Last 24 hours) at 11/25/2020 0844 Last data filed at 11/25/2020 0757 Gross per 24 hour  Intake 960 ml  Output 1000 ml  Net -40 ml     Pressure Injury 11/07/20 Heel Right Stage 1 -  Intact skin with non-blanchable redness of a localized area usually over a bony prominence. (Active)  11/07/20 0800  Location: Heel  Location Orientation: Right  Staging: Stage 1 -  Intact skin with non-blanchable redness of a localized area usually over a bony prominence.  Wound Description (Comments):   Present on Admission:      Pressure Injury 11/07/20 Sacrum Stage 2 -  Partial thickness loss of dermis presenting as a shallow open injury with a red, pink wound bed without slough. 4/15 skin is now broken; was documented as stage 1. now a stage 2. (Active)  11/07/20 2000  Location: Sacrum  Location Orientation:   Staging: Stage 2 -  Partial thickness loss of dermis presenting as a shallow open injury with a red, pink wound bed without slough.  Wound Description (Comments): 4/15 skin is now broken; was documented as stage 1. now a stage 2.  Present on Admission:      Pressure Injury 11/07/20 Heel Right Deep Tissue Pressure Injury - Purple or maroon localized area of discolored intact skin or blood-filled blister due to damage of underlying  soft tissue from pressure and/or shear. (Active)  11/07/20 2000  Location: Heel  Location Orientation: Right  Staging: Deep Tissue Pressure Injury - Purple or maroon localized area of discolored intact skin or blood-filled blister due to damage of underlying soft tissue from pressure and/or shear.  Wound Description (Comments):   Present on Admission:     Physical Exam: Vital Signs Blood pressure 125/77, pulse 86, temperature 98.2 F (36.8 C), resp. rate 19, height _0  (1.803 m), weight 83 kg, SpO2 93 %.       General: awake, alert, appropriate, sitting up in bed- finished breakfast, NAD HENT: conjugate gaze; oropharynx moist CV: regular rate; no JVD Pulmonary: CTA B/L; no W/R/R- good air movement GI: soft, NT, ND, (+)BS- JP drains- one has greenish brown drainage- mainly in tubing; other <5cc in it and nothing in tubing.  Psychiatric: wanders off topic; but appropriate otherwise Neurological: alert  Ext: no clubbing, cyanosis, or edema Genitourinary:foley out- requiring in/out caths Musculoskeletal:  Cervical back: Normal range of motionand neck supple.  Comments: RLE staples out.   UE's 5-/5 but equal B/L  LE's  4+/5 in HF, KE, DF and PF B/L  Skin: General: Skin is warmand dry.  Comments: RLE incisions as well as abd incisions already noted- staples still in place Neurological:  Mental Status: He is oriented to person, place, and time.  Comments: Soft voice with mild left facial weakness. Slow to respond but able to follow basic motor commands.  Delayed but reasonable processing and speech today Intact to light touch in all 4 extremities     Assessment/Plan: 1. Functional deficits which require 3+ hours per day of interdisciplinary therapy in a comprehensive inpatient rehab setting.  Physiatrist is providing close team supervision and 24 hour management of active medical problems listed below.  Physiatrist and rehab team continue to assess  barriers to discharge/monitor patient progress toward functional and medical goals  Care Tool:  Bathing    Body parts bathed by patient: Right arm,Left arm,Chest,Abdomen,Face   Body parts bathed by helper: Front perineal area,Buttocks,Right upper leg,Left upper leg,Right lower leg,Left lower leg     Bathing assist Assist Level: Maximal Assistance - Patient 24 - 49%     Upper Body Dressing/Undressing Upper body dressing   What is the patient wearing?: Pull over shirt    Upper body assist Assist Level: Moderate Assistance - Patient 50 - 74%    Lower Body Dressing/Undressing Lower body dressing      What is the patient wearing?: Pants     Lower body assist Assist for lower body dressing: Moderate Assistance - Patient 50 - 74%     Toileting Toileting    Toileting assist Assist for toileting: Dependent - Patient 0%     Transfers Chair/bed transfer  Transfers assist     Chair/bed transfer assist level: Moderate Assistance - Patient 50 - 74%     Locomotion Ambulation   Ambulation assist   Ambulation activity did not occur: Safety/medical concerns  Assist level: 2 helpers Assistive device: Walker-rolling Max distance: 10'   Walk 10 feet activity   Assist  Walk 10 feet activity did not occur: Safety/medical concerns  Assist level: 2 helpers Assistive device: Walker-rolling   Walk 50 feet activity   Assist Walk 50 feet with 2 turns activity did not occur: Safety/medical concerns         Walk 150 feet activity   Assist Walk 150 feet activity did not occur: Safety/medical concerns         Walk 10 feet on uneven surface  activity   Assist Walk 10 feet on uneven surfaces activity did not occur: Safety/medical concerns         Wheelchair     Assist Will patient use wheelchair at discharge?: Yes Type of Wheelchair: Manual Wheelchair activity did not occur: Safety/medical concerns  Wheelchair assist level: Supervision/Verbal  cueing Max wheelchair distance: 100'    Wheelchair 50 feet with 2 turns activity    Assist    Wheelchair 50 feet with 2 turns activity did not occur: Safety/medical concerns   Assist Level: Supervision/Verbal cueing   Wheelchair 150 feet activity     Assist  Wheelchair 150 feet activity did not occur: Safety/medical concerns       Blood pressure 125/77, pulse 86, temperature 98.2 F (36.8 C), resp. rate 19, height _0  (1.803 m), weight 83 kg, SpO2 93 %.  Medical Problem List and Plan: 1.L frontal craniotomysecondary to LDH/SAH from fall -patient may Shower if incisions covered -ELOS/Goals: 2-3 weeks- mod I to supervision          -con't  PT and OT 2. Impaired mobility: -DVT/anticoagulation: Pharmaceutical: Continue Lovenox -antiplatelet therapy: N/a 3. Low back pain: continue air mattress. Resolved. Dicussed XR findings of degeneration.   4/25- was having back pain- and R knee pain- if doesn't improve, will schedule Am pain meds  4/28- no complaints of pain- con't regimen 4. Mood: LCSW to follow for evaluation and support.  -antipsychotic agents: N/A 5. Neuropsych: This patient is capable of making decisions on his own behalf. 6. Multiple deep tissue injuries/Skin/Wound Care: Continue Air mattress overlay with pressure relief measures.  --Continue vitamins and collagen to promote wound healing  7. Fluids/Electrolytes/Nutrition: Monitor I/O. Check lytes in am. 8. SDH (concerns of bacterial infection): S/p Burr hole evacuation  9. Strep anginosis bacteremia: Continue Ceftriaxone/Metronidazole X 6 weeks w/end date  10 Liver abscess s/p JP drain placement: Continue flushes TID and document output.   -IR is following.  4/28- getting CT scan on abd/pelvis- with contrast- this AM- pending results-  11. New onset seizures: Continue Keppra bid till follow up with neuro on outpatient basis.  12. Left hip  ORIF: WBAT. Will consult ortho to eval given worsening pain. Added MS contin for better pain control/participation with therapy. Appears to have helped, continue.   4/26- will remove staples 13. T2DM: Hgb A1C- 10.8. Will monitor BS ac/hs. Home regimen Levemir 10u am/15u pm with SSI.  --continue Levemir 13u bid with SSI and titrate insulin as indicated.             -4/23 improved control  4/25- BGs 68-256 in last 24 hours- much more labile today- if still an issue tomorrow, will ask DM coordinators.   4/26- will consult DM coordinators  4/27- they suggested 70/30 16 units BID- so don't have to use novolog.   4/28- BGs- 95-226- but 226 was before got night time 70/30- so doing better overall- less labile 14. BPH/Urinary retention: Foley replaced--keep in place for decompression for few days then start bladder program next week?.  --will increase flomax to 0.8 mg/HS  4/27- will d/c foley- and cath if needed.  4/28- has needed in/out caths- adding lidocaine jelly prn for cathing- explained will take some time to kick in.    15. Pancreatic cancer with recurrence: Continue Creon with meals. --follow up with Hem/onc and Radiation Onc after discharge.  -wife asking about plans for radiation/chemo: will contact oncology to ask them to discuss with wife.  4/28- to start radiation 11/30/20 -is set up. At 1:30pm 16. Low back pain/muscle spasms- will start Robaxin prn and lidoderm patches 1- 8pm to 8am.              4/16: denies pain: decrease robaxin to q12H prn.  17. Hypokalemia: scheduled supplement and repeat BMP Monday 18. Overweight BMI 25.52: provide counseling.  19. Elevated alk phos: increasing,  Oncology notified. Oncology has discussed plans with wife for post- CIR follow-up.  20. Right sided pleural effusion: could be contributing to his chest pain. Consulted IR regarding drainage/pleural studies given active cancer. ADDENDUM: s/p successful  thoracentesis  -pt denies any SOB  4/25- VRE grew from Pleural effusion- ID to see today- linezolid started.    4/26- seen by ID- will con't Linizolid  4/28- will check CMP/CBC/CRP/ESR qMonday.    LOS: 13 days A FACE TO FACE EVALUATION WAS PERFORMED  Colton Bush 11/25/2020, 8:44 AM

## 2020-11-25 NOTE — Progress Notes (Addendum)
HEMATOLOGY-ONCOLOGY PROGRESS NOTE  SUBJECTIVE: The patient is currently in the rehab unit.  He is working with therapy at least 3 hours/day.  Reports that he is feeling stronger.  He is eating well.  He still has abdominal drains in place.  Due for a CT today to follow-up on the drains.  He remains on a total of 6 weeks starting from 4/9 of IV antibiotics for strep angninosis bacteremia.  Anticipate discharge from CIR on 5/6  Oncology History Overview Note  Cancer Staging Adenocarcinoma of head of pancreas [facility]) Staging form: Pancreas, AJCC 7th Edition - Clinical stage from 03/09/2016: Stage IIB (T2, N1, M0) - Signed by Truitt Merle, MD on 03/17/2016 - Pathologic stage from 11/17/2016: Stage IB (yT2, N0, cM0) - Signed by Truitt Merle, MD on 12/10/2016     Adenocarcinoma of head of pancreas ([place])  02/27/2016 Imaging   CT chest, abdomen and pelvis with contrast showed ill-defined heterogeneity of pancreatic head, highly suspicious for malignancy, ) quit about and biliary ductal dilatation, mildly prominent lymph nodes in the upper abdomen, largest 2 cm in the portocaval . No other metastasis.    03/09/2016 Initial Diagnosis   Adenocarcinoma of head of pancreas ([place])   03/09/2016 Procedure   Upper EUS showed a 3.5 cm irregular mass in the pancreatic head, causing pancreatic and biliary duct obstruction. The mass involves the portal vein 422 mm, strongly suggesting invasion. There is suspicious nearby adenopathy   03/09/2016 Initial Biopsy   Fine-needle aspiration of the pancreatic mass from EUS showed malignant cells consistent with adenocarcinoma.    03/28/2016 - 05/24/2016 Chemotherapy   neoadjuvant chemo with FOLFIRINOX every 2 weeks, for 5 cycles    06/28/2016 - 08/10/2016 Radiation Therapy   Neoadjuvant radiation by Dr. [name] Site/dose:   Pancreas treated to 45 Gy in 25 fractions. The Pancreas was then boosted to 54 Gy in 5 fractions.   06/28/2016 - 08/10/2016 Chemotherapy   Xeloda 2000 mg in  the morning and 1500 mg in the evening, on the day of radiation.   09/04/2016 Imaging   CT Chest Abdomen Pelvis IMPRESSION: 1. Poorly defined pancreatic head mass is grossly stable, with associated pancreatic ductal dilatation and portacaval adenopathy. No associated vascular encasement. 2. Common bile duct stent in place with stable biliary ductal dilatation. Left hepatic lobe atrophy. 3. Hepatomegaly.  Spleen is at the upper limits normal in size. 4. Aortic atherosclerosis (ICD10-170.0). Coronary artery calcification.   09/19/2016 Surgery   Diagnostic laparoscopy and possible whipple procedure by Dr. Barry Dienes. Whipple procedure aborted due to the pancreatic cancer involving SMV, SMA and portal veins.   09/19/2016 Pathology Results   Two liver lesions were noted during diagnostic laparoscopy and biopsy revealed benign hepatic parenchyma with assoicated fibrosis with no evidence of malignancy.   10/06/2016 - 10/20/2016 Chemotherapy   Gemcitabine and Abraxane weekly, on day 1, 8, and 15 every 28 days, starting on 10/06/2016, held after1 cycle due to pending surgery at [facility]   10/31/2016 Imaging   CT CAP w contrast at [facility] 10/31/16 Impression: 1.Interval decrease in the size of the ill-defined pancreatic head mass. There is short segment abutment of the portal vein/superior mesenteric vein but no evidence of venous distortion. 2.No evidence of metastatic disease in the chest, abdomen and pelvis.   11/17/2016 -  Hospital Admission   Patient presents to hospital for nausea and vomitting   11/17/2016 Surgery   pancreatecomy, promximal subtotal with total duodenectomy, partial  GASTRECTOMY, CHOLEDOCHOENTEROSTOMY AND GASTROJEJUNOSTOMY (WHIPPLE-TYPE PROCEDURE); WITH PANCREATOJEJUNOSTOMY  01/19/2017 - 03/02/2017 Chemotherapy   Gemcitabine monotherapy: 1000mg /m2, d1,8 Q21d x2 cycles planned    05/18/2017 Imaging   CT CAP at [facility] 05/18/17 Impression: 1.Status post Whipple with soft tissue density  just posterior to the SMA, which may be postsurgical, lymph node, or less likely recurrent disease. Recommend attention on follow-up 2.No evidence of metastatic disease in the chest, abdomen, and pelvis.     09/20/2017 Imaging   CT CAP at [facility] 09/20/17 Impression: 1. Unchanged subcentimeter soft tissue nodule in the Whipple surgical bed, likely a small lymph node. 2. No evidence of metastatic disease in the chest, abdomen, or pelvis.   01/17/2018 Imaging   01/17/2018 CT CAP Impression: No evidence of recurrent or metastatic disease.   06/13/2018 Imaging   CT CAP W Contrast at [place] on 06/13/18 Impression:  1. Status post Whipple procedure with stable findings at the surgical bed. No convincing evidence of recurrent or metastatic disease within the chest, abdomen, or pelvis.  Electronically Reviewed by:  Gaye Pollack, MD, [facility] Electronically Reviewed on:  06/13/2018 12:15 PM  I have reviewed the images and concur with the above findings.   11/08/2018 Imaging   CT CAP W Contrast  IMPRESSION: 1. No findings of active malignancy. Prior Whipple procedure. Biliary stent in place. 2. Other imaging findings of potential clinical significance: Thoracic scoliosis. Aortic Atherosclerosis (ICD10-I70.0). Multilevel lumbar impingement.     02/06/2019 Imaging   CT CAP W Contrast IMPRESSION: 1. Status post Whipple procedure with no definitive findings to suggest metastatic disease in the chest, abdomen or pelvis. 2. Stable tiny pulmonary nodules in the right upper lobe compared to prior examinations, strongly favored to be benign. 3. Aortic atherosclerosis, in addition to left main and 2 vessel coronary artery disease. Please note that although the presence of coronary artery calcium documents the presence of coronary artery disease, the severity of this disease and any potential stenosis cannot be assessed on this non-gated CT examination. Assessment for potential risk  factor modification, dietary therapy or pharmacologic therapy may be warranted, if clinically indicated. 4. Additional incidental findings, as above.   04/2019 Procedure   He had PAC removed in 04/2019   08/14/2019 Imaging   CT CAP at [facility] 08/14/19 IMPRESSION: 1.  History of pancreatic adenocarcinoma status post Whipple with stable findings at the surgical bed. 2.  No evidence of metastatic disease in the chest, abdomen, or pelvis. 3.  Arterial phase hyperenhancing 9 mm lesion in subcapsular portion of hepatic segment 8, favor a flash filling hemangioma. Attention on follow-up.   09/04/2019 PET scan   IMPRESSION: 1. Post Whipple procedure without evidence of local pancreatic carcinoma recurrence. 2. No evidence metastatic disease in the liver or periportal lymph nodes. 3. No distant metastatic disease.   11/11/2019 Imaging   CT CAP Impression:   1. Stable appearance of arterially enhancing 9 mm lesion within segment 8  of the liver. Continued attention on follow-up.  2. No evidence of metastatic disease within the chest, abdomen, or pelvis.  3. Stable appearance of soft tissue density around the common hepatic  artery, for example series 8, image 44. This may represent post-surgical  changes. Continued attention on follow-up.    02/10/2020 Imaging   CT CAP  Impression:  Similar appearance of ill-defined soft tissue surrounding the common  hepatic artery, favored to be postsurgical. Attention on follow-up.   No definite evidence of metastatic disease.   Previously identified arterially enhancing 9 mm lesion in segment 8 of the  liver not well evaluated on today's study due to lack of arterial phase  imaging, possibly flash-filling hemangioma. Recommend attention on  follow-up.    04/14/2020 Tumor Marker   CA 19-9 at 1124   04/26/2020 Imaging   PET  IMPRESSION: Status post Whipple procedure.   Soft tissue fullness in the porta hepatis with associated  mild hypermetabolism, worrisome for recurrence.   No evidence of distant metastases.   05/27/2020 Procedure   EUS by Dr Ardis Hughs  IMPRESSION - I was unable to visualize the region of the porta hepatitis (where the PET avid soft tissue was noted) due to his surgically altered anatomy    06/04/2020 Pathology Results    Liver lesion biopsy  Fine Needle Aspiration by Dr Mariah Milling at [place] bland, hypocellular soft tissue fragments.    See Comment.    Comment:  The patient's prior surgical pathology specimen [id number]) was reviewed. Evidence of adenocarcinoma is absent in this specimen.  Evaluation is limited by scant cellularity.   A repeat tissue sampling procedure may be helpful, if clinically indicated   08/31/2020 Imaging   CT CAP at [facility]  Impression:   1. Increased recurrent soft tissue along the celiac trunk and common and  proper hepatic arteries with unchanged associated occlusion of the  intrahepatic left portal vein and occlusion of the accessory left hepatic  artery. Increased associated narrowing of the common and proper hepatic  arteries.  2. Increased intrahepatic biliary duct dilation likely due to  aforementioned malignant soft tissue tracking into the liver hilum. Suggest  correlation with lab values for biliary obstruction.    08/31/2020 Imaging   MRI abdomen  Impression:   1. No parenchymal abnormality in the previously identified area of concern  in the left hepatic lobe.  2. Similar-appearing atrophy of the left hepatic lobe with mildly increased  intrahepatic bile duct dilation.  3. Multifocal areas of arterial enhancement in the liver. At least 2 of  these demonstrate uptake on the hepatobiliary phase. Findings could  represent a combination of focal perfusion anomalies and FNH.  4. Ill-defined soft tissue surrounding the celiac trunk is better assessed  on same day CT.    08/31/2020 Imaging   MRI at [facility]  Impression:   1. No parenchymal  abnormality in the previously identified area of concern  in the left hepatic lobe.  2. Similar-appearing atrophy of the left hepatic lobe with mildly increased  intrahepatic bile duct dilation.  3. Multifocal areas of arterial enhancement in the liver. At least 2 of  these demonstrate uptake on the hepatobiliary phase. Findings could  represent a combination of focal perfusion anomalies and FNH.  4. Ill-defined soft tissue surrounding the celiac trunk is better assessed  on same day CT.     08/31/2020 Imaging   CT CAP at [facility]  Impression:   1. Increased recurrent soft tissue along the celiac trunk and common and  proper hepatic arteries with unchanged associated occlusion of the  intrahepatic left portal vein and occlusion of the accessory left hepatic  artery. Increased associated narrowing of the common and proper hepatic  arteries.  2. Increased intrahepatic biliary duct dilation likely due to  aforementioned malignant soft tissue tracking into the liver hilum. Suggest  correlation with lab values for biliary obstruction.       REVIEW OF SYSTEMS:   The patient is somewhat sedated following surgery.  He does report a mild headache.  I have reviewed the past medical history, past  surgical history, social history and family history with the patient and they are unchanged from previous note.   PHYSICAL EXAMINATION: ECOG PERFORMANCE STATUS: 2 - Symptomatic, <50% confined to bed  Vitals:   11/25/20 0428 11/25/20 1329  BP: 125/77 128/70  Pulse: 86 91  Resp: 19 16  Temp: 98.2 F (36.8 C) 98.9 F (37.2 C)  SpO2: 93% 94%   Filed Weights   11/13/20 0421  Weight: 83 kg    Intake/Output from previous day: 04/27 0701 - 04/28 0700 In: 720 [P.O.:720] Out: 1000 [Urine:1000]  GENERAL: Chronically ill-appearing male, no distress SKIN: skin color, texture, turgor are normal, no rashes or significant lesions LUNGS: clear to auscultation and percussion with normal breathing  effort HEART: regular rate & rhythm and no murmurs and no lower extremity edema ABDOMEN: Positive bowel sounds, soft, nontender, drains in place NEURO: Alert, no focal neuro deficits  LABORATORY DATA:  I have reviewed the data as listed CMP Latest Ref Rng & Units 11/22/2020 11/17/2020 11/16/2020  Glucose 70 - 99 mg/dL 66(L) 107(H) 97  BUN 8 - 23 mg/dL 6(L) 6(L) 7(L)  Creatinine 0.61 - 1.24 mg/dL 0.42(L) 0.44(L) 0.42(L)  Sodium 135 - 145 mmol/L 134(L) 135 137  Potassium 3.5 - 5.1 mmol/L 3.7 3.6 3.1(L)  Chloride 98 - 111 mmol/L 100 102 103  CO2 22 - 32 mmol/L 27 28 28   Calcium 8.9 - 10.3 mg/dL 8.3(L) 8.1(L) 7.9(L)  Total Protein 6.5 - 8.1 g/dL 6.3(L) 5.8(L) -  Total Bilirubin 0.3 - 1.2 mg/dL 0.7 0.4 -  Alkaline Phos 38 - 126 U/L 354(H) 788(H) -  AST 15 - 41 U/L 12(L) 17 -  ALT 0 - 44 U/L 10 21 -    Lab Results  Component Value Date   WBC 10.6 (H) 11/22/2020   HGB 9.7 (L) 11/22/2020   HCT 30.4 (L) 11/22/2020   MCV 91.3 11/22/2020   PLT 489 (H) 11/22/2020   NEUTROABS 8.5 (H) 11/22/2020    DG Chest 1 View  Result Date: 11/19/2020 CLINICAL DATA:  Status post thoracentesis. EXAM: CHEST  1 VIEW COMPARISON:  11/18/2020 FINDINGS: Near complete resolution of the right-sided pleural effusion. No evidence for right pneumothorax. Atelectasis noted right lung base. Minimal subsegmental atelectasis or linear scarring noted left lower lung. The cardiopericardial silhouette is within normal limits for size. The visualized bony structures of the thorax show no acute abnormality. Percutaneous drainage catheters overlie the right upper quadrant of the abdomen. IMPRESSION: Near complete resolution of right pleural effusion status post thoracentesis. No pneumothorax. Electronically Signed   By: Misty Stanley M.D.   On: 11/19/2020 12:14   DG Chest 2 View  Result Date: 11/18/2020 CLINICAL DATA:  Chest pain. EXAM: CHEST - 2 VIEW COMPARISON:  11/08/2020. FINDINGS: Borderline cardiomegaly. Diffuse right lung  infiltrate. Moderate right pleural effusion. Mild atelectasis left lung base. No pneumothorax. Prior cervical spine fusion. IMPRESSION: 1.  Diffuse right lung infiltrate.  Moderate right pleural effusion. 2.  Mild left base subsegmental atelectasis. 3.  Borderline cardiomegaly. Electronically Signed   By: Marcello Moores  Register   On: 11/18/2020 15:18   DG Lumbar Spine 2-3 Views  Result Date: 11/16/2020 CLINICAL DATA:  Pain right flank EXAM: LUMBAR SPINE - 2-3 VIEW COMPARISON:  CT abdomen pelvis 08/31/2020 FINDINGS: Mild retrolisthesis L2-3 and L3-4. Diffuse disc degeneration with disc space narrowing and spurring throughout the lumbar spine. Mild lumbar kyphosis Negative for fracture or mass Pigtail drainage catheter right upper quadrant. Bowel staples in the region of the  stomach. IMPRESSION: Multilevel lumbar degenerative change.  No acute abnormality. Electronically Signed   By: Franchot Gallo M.D.   On: 11/16/2020 16:45   CT HEAD WO CONTRAST  Result Date: 11/07/2020 CLINICAL DATA:  Subdural hematoma EXAM: CT HEAD WITHOUT CONTRAST TECHNIQUE: Contiguous axial images were obtained from the base of the skull through the vertex without intravenous contrast. COMPARISON:  Brain MRI same day FINDINGS: Brain: Left holohemispheric subdural collection is diffusely hypodense and measures 10 mm in thickness. Mass effect on the left hemisphere with only trace midline shift. No hydrocephalus. No intraparenchymal hemorrhage. Vascular: No hyperdense vessel or unexpected calcification. Skull: Normal. Negative for fracture or focal lesion. Sinuses/Orbits: No acute finding. Other: None. IMPRESSION: Left holohemispheric subdural collection measuring 10 mm in thickness with mild mass effect on the left hemisphere and only trace midline shift. This is most consistent with a late subacute or chronic subdural hematoma. Electronically Signed   By: Ulyses Jarred M.D.   On: 11/07/2020 21:58   MR BRAIN WO CONTRAST  Addendum Date:  11/07/2020   ADDENDUM REPORT: 11/07/2020 20:54 ADDENDUM: Critical Value/emergent results were called by telephone at the time of interpretation on 11/07/2020 at 8:54 pm to provider Lovey Newcomer , who verbally acknowledged these results. Electronically Signed   By: Ulyses Jarred M.D.   On: 11/07/2020 20:54   Result Date: 11/07/2020 CLINICAL DATA:  Altered mental status EXAM: MRI HEAD WITHOUT CONTRAST TECHNIQUE: Multiplanar, multiecho pulse sequences of the brain and surrounding structures were obtained without intravenous contrast. COMPARISON:  None. FINDINGS: Brain: Left holo hemispheric extra-axial collection, likely hematoma, measuring 10 mm in thickness. Mild mass effect on the left hemisphere with minimal midline shift. There is some subarachnoid blood over the left frontal lobe. Areas of abnormal diffusion restriction in the left temporal and parietal lobes. No intraparenchymal hemorrhage. There is multifocal hyperintense T2-weighted signal within the white matter. Generalized volume loss without a clear lobar predilection. The midline structures are normal. Vascular: Major flow voids are preserved. Skull and upper cervical spine: Normal calvarium and skull base. Visualized upper cervical spine and soft tissues are normal. Sinuses/Orbits:No paranasal sinus fluid levels or advanced mucosal thickening. No mastoid or middle ear effusion. Normal orbits. IMPRESSION: 1. Age indeterminate, mixed left subarachnoid and subdural hematoma measuring 10 mm in thickness. Head CT recommended for better temporal characterization. 2. Areas of suspected acute ischemia in the left temporal and parietal lobes, though diffusion-weighted imaging is complicated by the adjacent blood products and their magnetic susceptibility effects. Electronically Signed: By: Ulyses Jarred M.D. On: 11/07/2020 20:33   CT CHEST ABDOMEN PELVIS W CONTRAST  Result Date: 11/06/2020 CLINICAL DATA:  Pyelonephritis.  Complicated sepsis. EXAM: CT CHEST,  ABDOMEN, AND PELVIS WITH CONTRAST TECHNIQUE: Multidetector CT imaging of the chest, abdomen and pelvis was performed following the standard protocol during bolus administration of intravenous contrast. CONTRAST:  132mL OMNIPAQUE IOHEXOL 300 MG/ML  SOLN COMPARISON:  Outside abdominal CT 10/14/2020 FINDINGS: CT CHEST FINDINGS Cardiovascular: Normal heart size. No pericardial effusion. Coronary atherosclerosis. Limited opacification of the pulmonary arteries. Mediastinum/Nodes: Negative for adenopathy or mass. Lungs/Pleura: Airspace opacity in the subpleural left lower lobe and patchy reticulonodular density in the bilateral lower lobes. No edema or effusion. No pneumothorax. Musculoskeletal: No acute finding.  Mild scoliosis CT ABDOMEN PELVIS FINDINGS Hepatobiliary: Sequela of Whipple procedure.There is soft tissue density at the liver hilum better demonstrated on prior pancreas protocol CT with arterial narrowings described on that study. Intrahepatic bile duct dilatation which is similar to previous. Left  lobe of the liver is atrophic in the setting of chronic left portal vein occlusion and hepatic arterial narrowings. There is a cluster of new low-density areas in the posterior right lobe liver with the largest collection measuring 4.6 cm. Mild hypo perfusion in the adjacent parenchyma. Pancreas: Status post Whipple procedure. Atrophy of the remaining pancreas. Soft tissue density around the celiac branches as described on outside CT staging. No acute pancreatitis is seen. Spleen: Negative Adrenals/Urinary Tract: Negative adrenals. No hydronephrosis or stone. Unremarkable bladder which is decompressed by a Foley catheter. Stomach/Bowel: No evidence of bowel obstruction. Sequela of local procedure. Rectal stool distention with mild mesorectal edema. The rectum measures up to 8 cm in diameter. Vascular/Lymphatic: Celiac branch narrowings and chronic left portal vein occlusion as described above. Atheromatous  calcification. No discrete retroperitoneal nodes. Reproductive:No pathologic findings. Other: No ascites or pneumoperitoneum. Musculoskeletal: Intertrochanteric right femur fracture with recent repair. No acute osseous finding. Lumbar spine degeneration and scoliosis. Message was sent to the clinical team via secure chat in epic. IMPRESSION: 1. Cluster of bilomas/abscess in the posterior right lobe liver with the largest component measuring up to 4.6 cm. Bile duct dilatation is similar to recent staging scan 08/31/2020. 2. Whipple procedure for pancreas carcinoma with infiltrative soft tissue about the narrowed celiac branches and hepatic hilum, reference dedicated staging scan at [facility] 08/31/2020. 3. No CT findings to correlate with history of pyelonephritis. 4. Airspace disease in the bilateral lower lobes compatible with pneumonia or aspiration. Notable subpleural involvement in the left lower lobe is likely consolidation given the overall pattern but please have low threshold for lower extremity Doppler ultrasound given recent right femur fracture repair. 5. Moderate stool distended rectum with mesorectal edema. Electronically Signed   By: Monte Fantasia M.D.   On: 11/06/2020 06:29   DG CHEST PORT 1 VIEW  Result Date: 11/08/2020 CLINICAL DATA:  Dyspnea EXAM: PORTABLE CHEST 1 VIEW COMPARISON:  11/07/2020 FINDINGS: Mild right basilar atelectasis or infiltrate again noted, better appreciated on prior CT examination of 11/06/2020. Mild right-sided volume loss is unchanged. No pneumothorax or pleural effusion. Cardiac size within normal limits. Pulmonary vascularity is normal. Pigtail catheter noted overlying the right upper quadrant. IMPRESSION: Stable right-sided volume loss and mild right basilar atelectasis or infiltrate. Electronically Signed   By: Fidela Salisbury MD   On: 11/08/2020 06:57   DG CHEST PORT 1 VIEW  Result Date: 11/07/2020 CLINICAL DATA:  Shortness of breath EXAM: PORTABLE CHEST 1  VIEW COMPARISON:  November 05, 2020 chest radiograph; chest CT November 06, 2020 FINDINGS: There is airspace opacity in each lung base. Lungs elsewhere clear. Heart size and pulmonary vascularity normal. No adenopathy. There is lower thoracic dextroscoliosis. There is postoperative change in the lower cervical spine. IMPRESSION: Patchy airspace opacity in the lung bases, similar to 1 day prior but increased compared to 2 days prior. Stable cardiac silhouette. No adenopathy evident. Electronically Signed   By: Lowella Grip III M.D.   On: 11/07/2020 07:58   DG Chest Port 1 View  Result Date: 11/05/2020 CLINICAL DATA:  Sepsis. EXAM: PORTABLE CHEST 1 VIEW COMPARISON:  Chest x-ray 10/18/2020 FINDINGS: The cardiac silhouette, mediastinal and hilar contours are within normal limits. The lungs are clear of an acute process. No pulmonary lesions or pleural effusion. The bony thorax is intact. IMPRESSION: No acute cardiopulmonary findings. Electronically Signed   By: Marijo Sanes M.D.   On: 11/05/2020 18:53   DG Swallowing Func-Speech Pathology  Result Date: 11/08/2020 Objective Swallowing  Evaluation: Type of Study: MBS-Modified Barium Swallow Study  Patient Details Name: SONAM HUELSMANN MRN: [id number] Date of Birth: June 06, 1956 Today's Date: 11/08/2020 Time: SLP Start Time (ACUTE ONLY): 6606 -SLP Stop Time (ACUTE ONLY): 1055 SLP Time Calculation (min) (ACUTE ONLY): 15 min Past Medical History: Past Medical History: Diagnosis Date . Arthritis   left hand . Bronchitis 1977 . Cancer ([place]) 03/09/2016  pancreatic cancer, sees Dr. Cristino Martes at [facility]  . Depression   takes Cymbalta daily . Diabetes mellitus type II   sees Dr. Chalmers Cater  . GERD (gastroesophageal reflux disease)   takes Omeprazole daily . H/O hiatal hernia  . Hyperlipidemia   takes Zocor daily . Hypertension   takes Amlodipine daily . Hypoglycemia 06/18/2017 . Neck pain   C4-7 stenosis and herniated disc . Neuromuscular disorder ([place])   hiatal hernia . Scoliosis    slight . Spinal cord injury, C5-C7 ([place])   c4-c7 . Stiffness of hand joint   d/t cervical issues Past Surgical History: Past Surgical History: Procedure Laterality Date . ANTERIOR CERVICAL DECOMP/DISCECTOMY FUSION  08/18/2011  Procedure: ANTERIOR CERVICAL DECOMPRESSION/DISCECTOMY FUSION 3 LEVELS;  Surgeon: Winfield Cunas, MD;  Location: [place] [facility];  Service: Neurosurgery;  Laterality: N/A;  Anterior Cervical Four-Five/Five-Six/Six-Seven Decompression with Fusion, Plating, and Bonegraft . CARPAL TUNNEL RELEASE  2013  bilateral, per Dr. Christella Noa  . COLONOSCOPY  10-30-14  per Dr. Olevia Perches, clear, repeat in 10 yrs  . egd with esophageal dilation  9-08  per Dr. Olevia Perches . ERCP N/A 03/01/2016  Procedure: ENDOSCOPIC RETROGRADE CHOLANGIOPANCREATOGRAPHY (ERCP) with brushings and stent;  Surgeon: Doran Stabler, MD;  Location: [facility];  Service: Endoscopy;  Laterality: N/A; . ESOPHAGOGASTRODUODENOSCOPY (EGD) WITH PROPOFOL N/A 05/27/2020  Procedure: ESOPHAGOGASTRODUODENOSCOPY (EGD) WITH PROPOFOL;  Surgeon: Milus Banister, MD;  Location: [facility];  Service: Endoscopy;  Laterality: N/A; . EUS N/A 03/09/2016  Procedure: ESOPHAGEAL ENDOSCOPIC ULTRASOUND (EUS) RADIAL;  Surgeon: Milus Banister, MD;  Location: [facility];  Service: Endoscopy;  Laterality: N/A; . EUS N/A 05/27/2020  Procedure: UPPER ENDOSCOPIC ULTRASOUND (EUS) RADIAL;  Surgeon: Milus Banister, MD;  Location: [facility];  Service: Endoscopy;  Laterality: N/A; . FEMUR IM NAIL Right 10/25/2020  Procedure: INTRAMEDULLARY (IM) NAIL FEMORAL;  Surgeon: Rod Can, MD;  Location: [facility];  Service: Orthopedics;  Laterality: Right; . lymph nodes biopsy   . melanoma rt calf  1999 . PORT-A-CATH REMOVAL N/A 04/03/2019  Procedure: PORT REMOVAL;  Surgeon: Stark Klein, MD;  Location: [place];  Service: General;  Laterality: N/A; . PORTACATH PLACEMENT Left 03/22/2016  Procedure: INSERTION PORT-A-CATH;  Surgeon: Stark Klein, MD;  Location: [facility];  Service: General;   Laterality: Left; . SPINE SURGERY   . TONSILLECTOMY    as a child . ULNAR TUNNEL RELEASE  2013  right arm, per Dr. Christella Noa  . UPPER GASTROINTESTINAL ENDOSCOPY   . WHIPPLE PROCEDURE N/A 09/19/2016  Procedure: DIAGNOSTIC LAPAROSCOPY, LAPAROSCOPIC LIVER BIOPSY, RETROPERITONEAL EXPLORATION, INTRAOPERATIVE ULTRASOUND;  Surgeon: Stark Klein, MD;  Location: [place];  Service: General;  Laterality: N/A; HPI: Patient is a 65 year old Caucasian male with a past medical history significant for but not limited to pancreatic cancer diagnosed in 2017 with recent recurrence, diabetes mellitus type 2, hypertension, hyperlipidemia, chronic diastolic CHF with an EF of 55 to 60% and grade 1 diastolic dysfunction back in March 2022, recent right hip fracture status post repair and intramedullary nail as well as other comorbidities who presented to [facility] ED with a chief complaint of a  sudden change in mental status. In the ED is found to be septic and initial source was thought to be another UTI but then further work-up revealed that he had a liver abscess.  2 drains placed in his liver abscess  Subjective: alert but drowsy Assessment / Plan / Recommendation CHL IP CLINICAL IMPRESSIONS 11/08/2020 Clinical Impression Patient presents with a mild-mod oropharyngeal dysphagia with likely impact from current level of cognitive delays and decreased overall alertness. During oral phase, patient exhibited delays in anterior to posterior transit of thin liquids and puree solids boluses and with barium tablet in puree, exhibited mild premature spillage of puree into vallecular sinus likely due to decreased bolus cohesion. trace to mild oral residuals post initial swallow which eventually cleared with uncued subsequent swallows. During pharyngeal phase of swallow, patient exhibited min vallecular residuals with puree solids, trace to min vallecular, pyriform and posterior pharynx residuals with thin liquids. Residuals were observed to fully  clear pharynx during course of the study. Flash penetration was observed with thin liquids via straw sips and cup sips, however penetrate remained above vocal cords and fully cleared laryngeal vestibule. Barium tablet transited throught pharynx without difficulty. Slight narrowing of esophagus observed at level of approximately C4-6 where he has cervical hardware from h/o surgery. This narrowing did not result in significant stasis of boluses or significantly impact pharyngeal and upper esophageal transit. SLP recommending to initiate full liquids diet and with plan to work on upgraded solids trials at bedside. SLP Visit Diagnosis -- Attention and concentration deficit following -- Frontal lobe and executive function deficit following -- Impact on safety and function --   CH[facility] IP TREATMENT RECOMMENDATION 11/08/2020 Treatment Recommendations Therapy as outlined in treatment plan below   Prognosis 11/08/2020 Prognosis for Safe Diet Advancement Good Barriers to Reach Goals -- Barriers/Prognosis Comment -- [facility] IP DIET RECOMMENDATION 11/08/2020 SLP Diet Recommendations Other (Comment);Thin liquid Liquid Administration via Cup;Straw Medication Administration Whole meds with puree Compensations Minimize environmental distractions;Slow rate;Small sips/bites Postural Changes Seated upright at 90 degrees   CH[facility] IP OTHER RECOMMENDATIONS 11/08/2020 Recommended Consults -- Oral Care Recommendations Oral care BID;Staff/trained caregiver to provide oral care Other Recommendations --   CH[facility] IP FOLLOW UP RECOMMENDATIONS 11/08/2020 Follow up Recommendations 24 hour supervision/assistance;Skilled Nursing facility   [facility] IP FREQUENCY AND DURATION 11/08/2020 Speech Therapy Frequency (ACUTE ONLY) min 2x/week Treatment Duration 1 week      CHL IP ORAL PHASE 11/08/2020 Oral Phase -- Oral - Pudding Teaspoon -- Oral - Pudding Cup -- Oral - Honey Teaspoon -- Oral - Honey Cup -- Oral - Nectar Teaspoon -- Oral - Nectar Cup -- Oral - Nectar Straw -- Oral -  Thin Teaspoon -- Oral - Thin Cup Delayed oral transit;Weak lingual manipulation;Holding of bolus;Reduced posterior propulsion;Lingual/palatal residue Oral - Thin Straw Lingual/palatal residue;Delayed oral transit;Weak lingual manipulation;Reduced posterior propulsion Oral - Puree Delayed oral transit;Weak lingual manipulation;Lingual/palatal residue;Reduced posterior propulsion Oral - Mech Soft -- Oral - Regular -- Oral - Multi-Consistency -- Oral - Pill Decreased bolus cohesion;Delayed oral transit;Reduced posterior propulsion;Weak lingual manipulation;Premature spillage Oral Phase - Comment --  CHL IP PHARYNGEAL PHASE 11/08/2020 Pharyngeal Phase -- Pharyngeal- Pudding Teaspoon -- Pharyngeal -- Pharyngeal- Pudding Cup -- Pharyngeal -- Pharyngeal- Honey Teaspoon -- Pharyngeal -- Pharyngeal- Honey Cup -- Pharyngeal -- Pharyngeal- Nectar Teaspoon -- Pharyngeal -- Pharyngeal- Nectar Cup -- Pharyngeal -- Pharyngeal- Nectar Straw -- Pharyngeal -- Pharyngeal- Thin Teaspoon -- Pharyngeal -- Pharyngeal- Thin Cup Pharyngeal residue - valleculae;Pharyngeal residue - pyriform;Pharyngeal residue - posterior pharnyx Pharyngeal  Material enters airway, remains ABOVE vocal cords then ejected out Pharyngeal- Thin Straw Penetration/Aspiration during swallow;Pharyngeal residue - pyriform;Pharyngeal residue - posterior pharnyx;Pharyngeal residue - valleculae Pharyngeal Material enters airway, remains ABOVE vocal cords then ejected out Pharyngeal- Puree Pharyngeal residue - valleculae Pharyngeal -- Pharyngeal- Mechanical Soft -- Pharyngeal -- Pharyngeal- Regular -- Pharyngeal -- Pharyngeal- Multi-consistency -- Pharyngeal -- Pharyngeal- Pill WFL Pharyngeal -- Pharyngeal Comment --  CHL IP CERVICAL ESOPHAGEAL PHASE 11/08/2020 Cervical Esophageal Phase Impaired Pudding Teaspoon -- Pudding Cup -- Honey Teaspoon -- Honey Cup -- Nectar Teaspoon -- Nectar Cup -- Nectar Straw -- Thin Teaspoon -- Thin Cup Other (Comment) Thin Straw -- Puree --  Mechanical Soft -- Regular -- Multi-consistency -- Pill -- Cervical Esophageal Comment -- Sonia Baller, MA, CCC-SLP Speech Therapy             EEG adult  Result Date: 11/08/2020 Plancher, Baron Sane, MD     11/08/2020  4:46 PM PROCEDURE: EEG video routine 23 minutesstudy DATES OF TEST: 11/08/20 REASON FOR TEST: Seizure AED/Sedative MEDICATIONS: Keppra TECHNIQUE: This is an 18 channel digital EEG recording using the standard international 10/20 system of electrode placement with one channel EKG recording. Pt was awake and drowsy during this recording. FINDINGS: Background rhythm:symmetric 8.5-12 Hz  With intermittent asymmetric left 3-5 Hz Amplitude : normal Breach effect:  NO Variability: NO Reactivity: NO Rhythmic delta activity:NO Periodic discharges: NO Sporadic epileptiform discharges:NO Electrographic/electroclinical seizures:NO Limited EKG reveals no abnormalities. IMPRESSION: This is an abnormal study due to - 1. L Focal slowing is consistent with a focal disturbance of cerebral function and an underlying structural lesion should be considered. 2. No definite epileptiform discharges or electrographic seizures noted.   Overnight EEG with video  Result Date: 11/09/2020 Lora Havens, MD     11/10/2020  4:09 AM Patient Name: ATSUSHI TRINDADE MRN: [id number] Epilepsy Attending: Lora Havens Referring Physician/Provider: Dr Roland Rack Duration: 11/08/2020 2157 to 11/09/2020 1008 Patient history: 65 year old male with a history of pancreatic adenocarcinoma with recurrence, recent fall and admission for sepsis, now with seizures. EEG to evaluate for seizure Level of alertness: Awake, asleep AEDs during EEG study: LEV Technical aspects: This EEG study was done with scalp electrodes positioned according to the 10-20 International system of electrode placement. Electrical activity was acquired at a sampling rate of 500Hz  and reviewed with a high frequency filter of 70Hz  and a low frequency filter of  1Hz . EEG data were recorded continuously and digitally stored. Description: The posterior dominant rhythm consists of 9 Hz activity of moderate voltage (25-35 uV) seen predominantly in posterior head regions, symmetric and reactive to eye opening and eye closing.  Sleep was characterized by vertex waves, sleep spindles (12 to 14 Hz), maximal frontocentral region.  EEG showed intermittent 3 to 6 Hz theta-delta slowing in left temporal region. Hyperventilation and photic stimulation were not performed.   ABNORMALITY - Intermittent slow, left temporal region IMPRESSION: This study is suggestive of cortical dysfunction arising from left temporal region, nonspecific etiology but likely secondary to underlying subdural Hematoma. No seizures or definite epileptiform discharges were seen throughout the recording. [place]   DG HIP UNILAT WITH PELVIS 2-3 VIEWS RIGHT  Result Date: 11/16/2020 CLINICAL DATA:  Follow-up hip fracture EXAM: DG HIP (WITH OR WITHOUT PELVIS) 3V RIGHT COMPARISON:  10/25/2020 FINDINGS: Medullary rod and proximal fixation screw are again seen and stable. Changes of prior proximal right femoral fracture are seen with fracture fragments in near anatomic alignment. No new  focal abnormality is noted. IMPRESSION: Status post ORIF of proximal right femoral fracture. Electronically Signed   By: Inez Catalina M.D.   On: 11/16/2020 21:16   CT IMAGE GUIDED DRAINAGE BY PERCUTANEOUS CATHETER  Result Date: 11/08/2020 INDICATION: 65 year old male with a history hepatic abscess referred for drainage EXAM: CT GUIDED DRAINAGE OF HEPATIC ABSCESS MEDICATIONS: The patient is currently admitted to the hospital and receiving intravenous antibiotics. The antibiotics were administered within an appropriate time frame prior to the initiation of the procedure. ANESTHESIA/SEDATION: 0 mg IV Versed 25 mcg IV Fentanyl Moderate Sedation Time:  0 The patient was continuously monitored during the procedure by the  interventional radiology nurse under my direct supervision. COMPLICATIONS: None TECHNIQUE: Informed written consent was obtained from the patient after a thorough discussion of the procedural risks, benefits and alternatives. All questions were addressed. Maximal Sterile Barrier Technique was utilized including caps, mask, sterile gowns, sterile gloves, sterile drape, hand hygiene and skin antiseptic. A timeout was performed prior to the initiation of the procedure. PROCEDURE: CT of the liver was performed with the patient in right anterior oblique position. The right upper quadrant was prepped with chlorhexidine in a sterile fashion, and a sterile drape was applied covering the operative field. A sterile gown and sterile gloves were used for the procedure. Local anesthesia was provided with 1% Lidocaine. Once we determine appropriate angle of approach for drainage, 1% lidocaine was used for local anesthesia. Using CT guidance, 2 separate 18 gauge trocar needles were advanced intercostal location towards the posterior right hepatic abscess. Once we confirmed needle tip position, modified Seldinger technique was used to place 2 separate 10 French pigtail drainage catheters. Aspiration of purulence fluid at both location confirmed adequate positioning. Both drains were sutured in position and attached to bulb suction. Final CT was acquired. Patient tolerated procedure well and remained hemodynamically stable throughout. No complications were encountered and no significant blood loss. FINDINGS: CT confirms 2 hypoechoic fluid collection in the posterior right liver. After CT-guided drainage, pigtail catheters remain adequately position with collapse of both cavities. Sample was sent from both sites. IMPRESSION: Status post CT-guided drainage of 2 separate right-sided hepatic abscess. Signed, Dulcy Fanny. Dellia Nims, RPVI [facility] Electronically Signed   By: Corrie Mckusick D.O.   On: 11/08/2020 08:21   ECHOCARDIOGRAM LIMITED  Result Date: 11/08/2020    ECHOCARDIOGRAM REPORT   Patient Name:   HAADI SANTELLAN Date of Exam: 11/06/2020 Medical Rec #:  [id number]      Height:       70.0 in Accession #:    [id number]     Weight:       174.2 lb Date of Birth:  12/09/1955      BSA:          1.968 m Patient Age:    74 years       BP:           118/71 mmHg Patient Gender: M              HR:           108 bpm. Exam Location:  Inpatient Procedure: Limited Echo, Cardiac Doppler and Limited Color Doppler Indications:    Bacteremia  History:        Patient has prior history of Echocardiogram examinations, most                 recent 10/22/2020. Sepsis; Risk Factors:Hypertension, Diabetes  and Dyslipidemia.  Sonographer:    Johny Chess Referring Phys: [place]  1. Limited echo, poor endocardial definition. Defininty contrast not used.  2. Left ventricular ejection fraction, by estimation, is 60 to 65%. The left ventricle has normal function. Left ventricular endocardial border not optimally defined to evaluate regional wall motion. There is mild left ventricular hypertrophy. Indeterminate diastolic filling due to E-A fusion.  3. Right ventricular systolic function is hyperdynamic. The right ventricular size is normal.  4. The mitral valve is abnormal. No evidence of mitral valve regurgitation.  5. The aortic valve is tricuspid. Aortic valve regurgitation is not visualized. Comparison(s): Changes from prior study are noted. 10/02/2020: LVEF 55-60%. Conclusion(s)/Recommendation(s): No evidence of valvular vegetations on this transthoracic echocardiogram, however, visualization of the valves was quite limited. Would recommend a transesophageal echocardiogram to exclude infective endocarditis if clinically indicated. FINDINGS  Left Ventricle: Left ventricular ejection fraction, by estimation, is 60 to 65%. The left ventricle has normal function. Left  ventricular endocardial border not optimally defined to evaluate regional wall motion. The left ventricular internal cavity size was normal in size. There is mild left ventricular hypertrophy. Indeterminate diastolic filling due to E-A fusion. Right Ventricle: The right ventricular size is normal. No increase in right ventricular wall thickness. Right ventricular systolic function is hyperdynamic. Left Atrium: Left atrial size was not well visualized. Right Atrium: Right atrial size was not well visualized. Pericardium: There is no evidence of pericardial effusion. Mitral Valve: The mitral valve is abnormal. There is mild thickening of the mitral valve leaflet(s). No evidence of mitral valve regurgitation. Tricuspid Valve: The tricuspid valve is not well visualized. Tricuspid valve regurgitation is not demonstrated. Aortic Valve: The aortic valve is tricuspid. Aortic valve regurgitation is not visualized. Pulmonic Valve: The pulmonic valve was not well visualized. Pulmonic valve regurgitation is not visualized. Aorta: The aortic arch was not well visualized and the ascending aorta was not well visualized. Venous: The inferior vena cava was not well visualized. IAS/Shunts: No atrial level shunt detected by color flow Doppler. Lyman Bishop MD Electronically signed by Lyman Bishop MD Signature Date/Time: 11/08/2020/10:13:44 AM    Final    IR THORACENTESIS ASP PLEURAL SPACE W/IMG GUIDE  Result Date: 11/19/2020 INDICATION: Patient with a history of pancreatic cancer presents today with pleural effusions. Interventional radiology asked to perform a therapeutic and diagnostic thoracentesis. EXAM: ULTRASOUND GUIDED THORACENTESIS MEDICATIONS: 1% lidocaine 10 mL COMPLICATIONS: None immediate. PROCEDURE: An ultrasound guided thoracentesis was thoroughly discussed with the patient and questions answered. The benefits, risks, alternatives and complications were also discussed. The patient understands and wishes to proceed  with the procedure. Written consent was obtained. Ultrasound was performed to localize and mark an adequate pocket of fluid in the right chest. The area was then prepped and draped in the normal sterile fashion. 1% Lidocaine was used for local anesthesia. Under ultrasound guidance a 6 Fr Safe-T-Centesis catheter was introduced. Thoracentesis was performed. The catheter was removed and a dressing applied. FINDINGS: A total of approximately 900 mL of amber-colored fluid was removed. Samples were sent to the laboratory as requested by the clinical team. IMPRESSION: Successful ultrasound guided right thoracentesis yielding 900 mL of pleural fluid. Read by: Soyla Dryer, NP Electronically Signed   By: Ruthann Cancer MD   On: 11/19/2020 12:28    ASSESSMENT AND PLAN: 1.  Locally recurrent pancreatic adenocarcinoma 2.  Severe sepsis secondary to liver abscess and pneumonia 3.  Subdural hematoma 4.  Acute toxic encephalopathy  5. ?  Seizure activity 6.  DKA, improved 7.  Hypertension 8.  Chronic diastolic CHF 9.  Anemia 10. Strep Anginosus bacteremia  -We discussed recommendation for radiation for his pancreatic cancer.  We will reach out to Dr. [name] and his PA to determine timing.  He will be discharged from CIR on 5/6 and we will see if they want a wait until after he is discharged versus starting sooner.  We will also make them aware that he still has liver drains in place and remains on IV antibiotics for bacteremia. -Discussed with the patient that chemotherapy is not planned at this time due to bacteremia. -Continue CIR.    LOS: 13 days   Mikey Bussing, DNP, AGPCNP-BC, AOCNP 11/25/20  Addendum  I have seen the patient along with NP Erasmo Downer. I agree with the assessment and and plan and have edited the notes. Mr. Barefield is making progress in rehab, and plan to go home next Friday.  He still has 2 draining tube in the right upper quadrant of abdomen fall liver abscess, and still on IV  antibiotics for total of 6 weeks.  Lab reviewed, his blood glucose has been better controlled, mild anemia is stable.  He is scheduled to start radiation for his pancreatic cancer with local recurrence.  We will send a message to radiation oncologist Dr. [name] to see if we need to postpone to the following week after his discharge.  I do not recommend concurrent chemotherapy with radiation, due to his recent severe infection with abscess and bacteremia.  I plan to see him back after hospital discharge, during his radiation course.  I called his wife [name] and left her a detail message about above.   Truitt Merle  11/25/2020

## 2020-11-25 NOTE — Progress Notes (Signed)
Physical Therapy Session Note  Patient Details  Name: Colton Bush MRN: [id number] Date of Birth: January 15, 1956  Today's Date: 11/25/2020 PT Individual Time: 1635-1700 PT Individual Time Calculation (min): 25 min   Short Term Goals: Week 2:  PT Short Term Goal 1 (Week 2): Pt will perform least restrictive transfer with mod A consistently PT Short Term Goal 2 (Week 2): Pt will initiate gait training PT Short Term Goal 3 (Week 2): Pt will perform bed mobility with min A consistently  Skilled Therapeutic Interventions/Progress Updates:   Pt received sitting on toilet and agreeable to PT. Stand in stedy with min assist for NT to complete pericare. Pt transported to bed. Pt performed supine> sit transfer wth min assist on LE. Supine ttherex with intermittent AAROM on the LLE: LAR, SAQ, hip abduction, heel slide, bridge, ankle PF with level 2 tband. Pt performed each exercises x 10 BLE with cues for full ROM and decreased speed. Left in bed with call bell in reach and all needs met.       Therapy Documentation Precautions:  Restrictions Weight Bearing Restrictions: No RLE Weight Bearing: Weight bearing as tolerated Pain: Pain Assessment Pain Scale: 0-10 Pain Score: 0-No pain Other Treatments:      Therapy/Group: Individual Therapy  Lorie Phenix 11/25/2020, 6:04 PM

## 2020-11-25 NOTE — Progress Notes (Signed)
Patient ID: Colton Bush, male   DOB: 16-Oct-1955, 65 y.o.   MRN: [id number]  SW spoke with Staci[facility] who accepted referral for HHPT/OT/SLP/SN/aide.   Loralee Pacas, MSW, [place] Office: [phone] Cell: [phone] Fax: [phone]

## 2020-11-25 NOTE — Progress Notes (Signed)
Speech Language Pathology Daily Session Note  Patient Details  Name: Colton Bush MRN: [id number] Date of Birth: 11-05-1955  Today's Date: 11/25/2020 SLP Individual Time: 7209-4709 SLP Individual Time Calculation (min): 45 min  Short Term Goals: Week 2: SLP Short Term Goal 1 (Week 2): Pt will complete moderately complex problem solving tasks with min A verbal cues SLP Short Term Goal 2 (Week 2): Pt will increase short term recall of novel/functional information with Supervision A cues SLP Short Term Goal 3 (Week 2): Pt will increase judgement/safety for home management tasks and ADLs with min A cues to recall current physical impairments  Skilled Therapeutic Interventions: Skilled SLP intervention focused on cognition. Pt completed 6x6 sudoku with min a verbal cues for attention to details, reasoning, and error awareness. Cues eventually faded to supervision and pt able to complete accurately with minimal errors once familiar with task. Pt able to recall schedule and times for therapy today with min A visual cues. He reports he notices an improvement in memory but aware that he will need a calendar or notepad to keep track of events once home. Cont with therapy per plan of care.      Pain Pain Assessment Pain Scale: Faces Faces Pain Scale: No hurt  Therapy/Group: Individual Therapy  Colton Bush Asier Desroches 11/25/2020, 1:03 PM

## 2020-11-25 NOTE — Progress Notes (Signed)
Physical Therapy Session Note  Patient Details  Name: OMEGA SLAGER MRN: [id number] Date of Birth: Dec 26, 1955  Today's Date: 11/25/2020 PT Individual Time: 1100-1200 PT Individual Time Calculation (min): 60 min   Short Term Goals: Week 2:  PT Short Term Goal 1 (Week 2): Pt will perform least restrictive transfer with mod A consistently PT Short Term Goal 2 (Week 2): Pt will initiate gait training PT Short Term Goal 3 (Week 2): Pt will perform bed mobility with min A consistently  Skilled Therapeutic Interventions/Progress Updates:    Pt received supine in bed, agreeable to PT session. No complaints of pain at rest, has onset of R knee pain with mobility that improves at rest. Pt reports he has not taken any pain medicine yet this date, education regarding taking medication prior to therapy session for improved pain management. Supine to sit with Supervision with HOB elevated and use of bedrails. Sit to stand with min to mod A to RW throughout session. Stand pivot transfers with RW and min A both directions throughout session. Ambulation 2 x 30 ft with RW and min A for balance with occasional R knee blocking for stability, close w/c follow for safety. Pt exhibits narrow BOS, able to correct with cues but then reverts back to narrow BOS, flexed trunk posture, and decreased LLE step length with onset of fatigue and R knee pain. Pt exhibits some impulsivity with onset of fatigue by attempting to sit down in w/c before it is behind him and locked. Pt has one LOB to the L, needs max A to recover and safely sit down in w/c. Nustep level 3 x 10 min with use of B UE/LE for global endurance training. Sit to stand x 5 reps from elevated bed to RW initially with min A, increasing to mod A with onset of fatigue and cues to not brace LE against bed during transfer. Pt returns to supine with Supervision with use of bedrails. Pt left supine in bed with needs in reach at end of session.  Therapy  Documentation Precautions:  Restrictions Weight Bearing Restrictions: No RLE Weight Bearing: Weight bearing as tolerated   Therapy/Group: Individual Therapy   Excell Seltzer, PT, DPT, CSRS  11/25/2020, 12:17 PM

## 2020-11-25 NOTE — Progress Notes (Signed)
Occupational Therapy Weekly Progress Note  Patient Details  Name: Colton Bush MRN: [id number] Date of Birth: 08/10/1955  Beginning of progress report period: November 13, 2020 End of progress report period: November 25, 2020  Today's Date: 11/25/2020 OT Individual Time: 6237-6283 OT Individual Time Calculation (min): 57 min    Patient has met 3 of 3 short term goals.  Patient is making steady progress towards OT goals. Pt can now complete sit<>stands and stand pivots with min/mod A and A RW. His standing balance/endurance are also improved within BADL tasks. Continue current POC.  Patient continues to demonstrate the following deficits: muscle weakness, decreased cardiorespiratoy endurance, decreased safety awareness and decreased memory and decreased sitting balance, decreased standing balance, decreased postural control and decreased balance strategies and therefore will continue to benefit from skilled OT intervention to enhance overall performance with BADL and Reduce care partner burden.  Patient progressing toward long term goals..  Continue plan of care.  OT Short Term Goals Week 2:  OT Short Term Goal 1 (Week 2): P twill transfer with +1 A and LRAD ot BSC OT Short Term Goal 1 - Progress (Week 2): Met OT Short Term Goal 2 (Week 2): Pt will sit to stand consistently with MOD A in prep for clothing managment OT Short Term Goal 2 - Progress (Week 2): Met OT Short Term Goal 3 (Week 2): Pt will groom seated EOB with S OT Short Term Goal 3 - Progress (Week 2): Met Week 3:  OT Short Term Goal 1 (Week 3): LTG=STG 2/2 ELOS  Skilled Therapeutic Interventions/Progress Updates:    Patient greeted semi-reclined in bed and agreeable to OT treatment session focused on self-care retraining. Pt completed bed mobility with min A to elevate trunk. Sit<>stand at EOB with increased time, min A, and RW. Min verbal cues for hand placement on RW. Pt then performed stand-pivot w/ RW over to wc. Pt brought to  the sink in wc and completed UB bathing with set-up and min verbal cues for thoroughess. B UE endurance to wash hair using soap and wash cloths, min cues for thoroughness with hair as well. Sit<>stand at the sink with minA, then had pt try to remove alternating UE from the sink to doff pants, when he had minor LOB requiring min A to correct. Pt declined to wash buttocks stating he was already cleaned. Pt then was able to thread pant legs with min A. Sit<>stand again with min A, then pt alternated UEs with min A for balance to pull up pants. OT assist to don socks and shoes 2/2 fatigue. Pt needed to get back to bed for IV to come and place new IV. Min A sit<.>stand from wc w/ RW and min A to take 5 steps to get back to bed. Min A to lift B LEs. Pt left semi-reclined in bed with nursing present and needs met.   Therapy Documentation Precautions:  Restrictions Weight Bearing Restrictions: No RLE Weight Bearing: Weight bearing as tolerated Pain:   denies pain  Therapy/Group: Individual Therapy  Valma Cava 11/25/2020, 8:33 AM

## 2020-11-25 NOTE — Progress Notes (Signed)
Pt bladder scan for 448ml, attempted to toilet, but no urine; cath'd pt and obtained 350 ml, post void scan shows 231ml. Pt was very uncomfortable during procedure.

## 2020-11-26 ENCOUNTER — Telehealth: Payer: Self-pay | Admitting: *Deleted

## 2020-11-26 ENCOUNTER — Ambulatory Visit: Payer: Medicare Other | Admitting: Radiation Oncology

## 2020-11-26 LAB — GLUCOSE, CAPILLARY
Glucose-Capillary: 141 mg/dL — ABNORMAL HIGH (ref 70–99)
Glucose-Capillary: 169 mg/dL — ABNORMAL HIGH (ref 70–99)
Glucose-Capillary: 217 mg/dL — ABNORMAL HIGH (ref 70–99)
Glucose-Capillary: 90 mg/dL (ref 70–99)

## 2020-11-26 NOTE — Plan of Care (Signed)
  Problem: Consults Goal: RH BRAIN INJURY PATIENT EDUCATION Description: Description: See Patient Education module for eduction specifics Outcome: Progressing Goal: Skin Care Protocol Initiated - if Braden Score 18 or less Description: If consults are not indicated, leave blank or document N/A Outcome: Progressing   Problem: RH BOWEL ELIMINATION Goal: RH STG MANAGE BOWEL WITH ASSISTANCE Description: STG Manage Bowel with Supervision Assistance. Outcome: Progressing Goal: RH STG MANAGE BOWEL W/MEDICATION W/ASSISTANCE Description: STG Manage Bowel with Medication with Supervision Assistance. Outcome: Progressing   Problem: RH BLADDER ELIMINATION Goal: RH STG MANAGE BLADDER WITH ASSISTANCE Description: STG Manage Bladder With Supervision Assistance Outcome: Progressing Goal: RH STG MANAGE BLADDER WITH MEDICATION WITH ASSISTANCE Description: STG Manage Bladder With Medication With Supervision Assistance. Outcome: Progressing   Problem: RH SKIN INTEGRITY Goal: RH STG SKIN FREE OF INFECTION/BREAKDOWN Description: Remain free from additional breakdown with supervision assistance while on rehab.  Outcome: Progressing Goal: RH STG MAINTAIN SKIN INTEGRITY WITH ASSISTANCE Description: STG Maintain Skin Integrity With Supervision Assistance. Outcome: Progressing Goal: RH STG ABLE TO PERFORM INCISION/WOUND CARE W/ASSISTANCE Description: STG Able To Perform Incision/Wound Care With Supervision Assistance. Outcome: Progressing   Problem: RH SAFETY Goal: RH STG ADHERE TO SAFETY PRECAUTIONS W/ASSISTANCE/DEVICE Description: STG Adhere to Safety Precautions With Supervision Assistance/Device. Outcome: Progressing Goal: RH STG DECREASED RISK OF FALL WITH ASSISTANCE Description: STG Decreased Risk of Fall With Supervision Assistance. Outcome: Progressing   Problem: RH PAIN MANAGEMENT Goal: RH STG PAIN MANAGED AT OR BELOW PT'S PAIN GOAL Description: <4 on a 0-10 pain scale. Outcome:  Progressing   Problem: RH KNOWLEDGE DEFICIT BRAIN INJURY Goal: RH STG INCREASE KNOWLEDGE OF SELF CARE AFTER BRAIN INJURY Description: Patient will be able to demonstrate knowledge of medication management, pain management, skin/wound care, weight bearing precautions, diabetes management with educational materials and handouts provided by staff, at discharge independently. Outcome: Progressing   

## 2020-11-26 NOTE — Progress Notes (Signed)
Speech Language Pathology Weekly Progress and Session Note  Patient Details  Name: Colton Bush MRN: [id number] Date of Birth: [birth year]  Beginning of progress report period: November 20, 2020 End of progress report period: November 26, 2020  Today's Date: 11/26/2020 SLP Individual Time: 8343-7357 SLP Individual Time Calculation (min): 44 min  Short Term Goals: Week 2: SLP Short Term Goal 1 (Week 2): Pt will complete moderately complex problem solving tasks with min A verbal cues SLP Short Term Goal 1 - Progress (Week 2): Met SLP Short Term Goal 2 (Week 2): Pt will increase short term recall of novel/functional information with Supervision A cues SLP Short Term Goal 2 - Progress (Week 2): Not met SLP Short Term Goal 3 (Week 2): Pt will increase judgement/safety for home management tasks and ADLs with min A cues to recall current physical impairments SLP Short Term Goal 3 - Progress (Week 2): Met    New Short Term Goals: Week 3: SLP Short Term Goal 1 (Week 3): Pt will complete moderately complex problem solving tasks with supervision A verbal cues SLP Short Term Goal 2 (Week 3): Pt will increase short term recall of novel/functional information with Supervision A cues SLP Short Term Goal 3 (Week 3): Pt will increase judgement/safety for home management tasks and ADLs with supervision A cues to recall current physical impairments  Weekly Progress Updates: Pt made good progress meeting 2 out 3 goals. Pt demonstrated improvements in recall, mildly complex problem solving and functional awareness in functional task such as medication and time management. Pt would continue to benefit from skilled ST services to focus on maximizing functional independence and reduce burden of care, requiring 24 hour supervision and continued ST services.     Intensity: Minumum of 1-2 x/day, 30 to 90 minutes Frequency: 3 to 5 out of 7 days Duration/Length of Stay: 5/6 or possible extension to  5/14 Treatment/Interventions: Cognitive remediation/compensation;Speech/Language facilitation;Patient/family education   Daily Session  Skilled Therapeutic Interventions: Skilled ST services focused on cognitive skills. SLP facilitated mildly complex problem solving, error awareness and recall skills in TID medication management task . Pt required initial mod A fade to min A verbal cues for problem solving, error awareness and recall within task. SLP will complete task in next session. Pt was left in room with call bell within reach and chair alarm set. Recommend to continue ST services.      General    Pain Pain Assessment Pain Score: 0-No pain  Therapy/Group: Individual Therapy  [name]  [facility] 11/26/2020, 5:23 PM

## 2020-11-26 NOTE — Progress Notes (Signed)
PROGRESS NOTE   Subjective:  Pt reports still having to be cathed- voided <10cc overnight into  Urinal- but still not voiding overall.  Ate 50% breakfast R heel hurting this AM- after wearing prevalon boot overnight.  R knee hurting LESS Walked 30 ft with RW x2 yesterday.  ROS:   Pt denies SOB, abd pain, CP, N/V/C/D, and vision changes Pain with cathing- not voiding  Objective:   CT ABDOMEN PELVIS W CONTRAST  Result Date: 11/25/2020 CLINICAL DATA:  Intra-abdominal abscess, hepatic abscesses post percutaneous drainage. EXAM: CT ABDOMEN AND PELVIS WITH CONTRAST TECHNIQUE: Multidetector CT imaging of the abdomen and pelvis was performed using the standard protocol following bolus administration of intravenous contrast. CONTRAST:  153m OMNIPAQUE IOHEXOL 300 MG/ML  SOLN COMPARISON:  November 06, 2020 FINDINGS: Lower chest: Interval development of pleural fluid with peripheral enhancement in the RIGHT lung base. Basilar atelectasis. No LEFT-sided effusion. Pleural fluid measuring 9.6 x 4.4 cm. Hepatobiliary: Atrophic LEFT hepatic lobe with biliary duct dilation. The soft tissue extending into the hepatic hilum along the course of hepatic vasculature. The SMV and main portal vein remain patent with distortion of the portal vein as on recent imaging including outside imaging from [facility] which was acquired on August 31, 2020. Occlusion of intrahepatic LEFT portal vein is similar to prior imaging. There is some biliary duct dilation also affecting the RIGHT hepatic lobe to a lesser extent. Hepatic abscesses and or biloma is that were drained have resolved. Drainage catheters remain in place in the RIGHT hemi liver. Pancreas: Post Whipple procedure. No pancreatic ductal dilation. Soft tissue about the celiac and extending in the porta hepatis as described. Spleen: Spleen is normal. Adrenals/Urinary Tract: Adrenal glands are normal.  Symmetric renal enhancement.  No hydronephrosis. Stomach/Bowel: Post Whipple with alteration of gastrointestinal anatomy as expected. No acute small bowel process. Colon is filled with liquid stool and is nondistended. Appendix is normal. Vascular/Lymphatic: No discrete adenopathy in the upper abdomen with soft tissue about the celiac axis. No retroperitoneal adenopathy. No mesenteric adenopathy. LEFT portal occlusion. Irregularity of the common hepatic artery and celiac axis due to surrounding ill-defined soft tissue. Reproductive: Unremarkable Other: No ascites.  No free air. Musculoskeletal: Spinal degenerative changes. Osteopenia. Post RIGHT femoral ORIF, recent postoperative changes with mild surrounding stranding, not unexpected and not significantly changed. IMPRESSION: 1. Findings of empyema in the RIGHT chest with associated airspace disease. This has developed since prior imaging of April 9th of 2022 but is decreased in size based on comparison with chest x-ray from November 18, 2020 following aspiration. 2. Hepatic abscesses and or biloma, post drainage now resolved. 3. Post Whipple procedure with soft tissue about the celiac axis and common hepatic artery. Similar appearance of atrophy of the LEFT hepatic lobe. Findings are suspicious for disease recurrence with chronic occlusion of LEFT portal vein and extensive involvement of the celiac axis. 4. Post RIGHT femoral ORIF, recent postoperative changes with mild surrounding stranding, not unexpected and not significantly changed. These results will be called to the ordering clinician or representative by the Radiologist Assistant, and communication documented in the PACS or [employer] Electronically Signed   By: GZetta Bills  M.D.   On: 11/25/2020 17:26   No results for input(s): WBC, HGB, HCT, PLT in the last 72 hours. No results for input(s): NA, K, CL, CO2, GLUCOSE, BUN, CREATININE, CALCIUM in the last 72 hours.  Intake/Output Summary (Last 24  hours) at 11/26/2020 0826 Last data filed at 11/26/2020 4562 Gross per 24 hour  Intake 240 ml  Output 2439 ml  Net -2199 ml     Pressure Injury 11/07/20 Heel Right Stage 1 -  Intact skin with non-blanchable redness of a localized area usually over a bony prominence. (Active)  11/07/20 0800  Location: Heel  Location Orientation: Right  Staging: Stage 1 -  Intact skin with non-blanchable redness of a localized area usually over a bony prominence.  Wound Description (Comments):   Present on Admission:      Pressure Injury 11/07/20 Sacrum Stage 2 -  Partial thickness loss of dermis presenting as a shallow open injury with a red, pink wound bed without slough. 4/15 skin is now broken; was documented as stage 1. now a stage 2. (Active)  11/07/20 2000  Location: Sacrum  Location Orientation:   Staging: Stage 2 -  Partial thickness loss of dermis presenting as a shallow open injury with a red, pink wound bed without slough.  Wound Description (Comments): 4/15 skin is now broken; was documented as stage 1. now a stage 2.  Present on Admission:      Pressure Injury 11/07/20 Heel Right Deep Tissue Pressure Injury - Purple or maroon localized area of discolored intact skin or blood-filled blister due to damage of underlying soft tissue from pressure and/or shear. (Active)  11/07/20 2000  Location: Heel  Location Orientation: Right  Staging: Deep Tissue Pressure Injury - Purple or maroon localized area of discolored intact skin or blood-filled blister due to damage of underlying soft tissue from pressure and/or shear.  Wound Description (Comments):   Present on Admission:     Physical Exam: Vital Signs Blood pressure 116/71, pulse 82, temperature 97.8 F (36.6 C), resp. rate 18, height _0  (1.803 m), weight 83 kg, SpO2 94 %.        General: awake, alert, appropriate, laying in bed; still gaunt face; NAD HENT: conjugate gaze; oropharynx moist CV: regular rate; no JVD Pulmonary: CTA  B/L; no W/R/R- good air movement- decreased slightly R base, but otherwise good.  GI: soft, mildly TTP- no rebound, ND, (+)BS Psychiatric: appropriate- occ tangential Neurological: alert, not Ox3 Ext: no clubbing, cyanosis, or edema Genitourinary:foley out- requiring in/out caths Musculoskeletal:  Cervical back: Normal range of motionand neck supple.  Comments: RLE staples out.   UE's 5-/5 but equal B/L  LE's 4+/5 in HF, KE, DF and PF B/L  Skin: General: Skin is warmand dry.  Comments: RLE incisions as well as abd incisions already noted- staples still in place Neurological:  Mental Status: He is oriented to person, place, and time.  Comments: Soft voice with mild left facial weakness. Slow to respond but able to follow basic motor commands.  Delayed but reasonable processing and speech today Intact to light touch in all 4 extremities     Assessment/Plan: 1. Functional deficits which require 3+ hours per day of interdisciplinary therapy in a comprehensive inpatient rehab setting.  Physiatrist is providing close team supervision and 24 hour management of active medical problems listed below.  Physiatrist and rehab team continue to assess barriers to discharge/monitor patient progress toward functional and medical goals  Care Tool:  Bathing  Body parts bathed by patient: Right arm,Left arm,Chest,Abdomen,Face   Body parts bathed by helper: Front perineal area,Buttocks,Right upper leg,Left upper leg,Right lower leg,Left lower leg     Bathing assist Assist Level: Maximal Assistance - Patient 24 - 49%     Upper Body Dressing/Undressing Upper body dressing   What is the patient wearing?: Pull over shirt    Upper body assist Assist Level: Moderate Assistance - Patient 50 - 74%    Lower Body Dressing/Undressing Lower body dressing      What is the patient wearing?: Pants     Lower body assist Assist for lower body dressing: Moderate Assistance -  Patient 50 - 74%     Toileting Toileting    Toileting assist Assist for toileting: Dependent - Patient 0%     Transfers Chair/bed transfer  Transfers assist     Chair/bed transfer assist level: Minimal Assistance - Patient > 75%     Locomotion Ambulation   Ambulation assist   Ambulation activity did not occur: Safety/medical concerns  Assist level: 2 helpers Assistive device: Walker-rolling Max distance: 30'   Walk 10 feet activity   Assist  Walk 10 feet activity did not occur: Safety/medical concerns  Assist level: 2 helpers Assistive device: Walker-rolling   Walk 50 feet activity   Assist Walk 50 feet with 2 turns activity did not occur: Safety/medical concerns         Walk 150 feet activity   Assist Walk 150 feet activity did not occur: Safety/medical concerns         Walk 10 feet on uneven surface  activity   Assist Walk 10 feet on uneven surfaces activity did not occur: Safety/medical concerns         Wheelchair     Assist Will patient use wheelchair at discharge?: Yes Type of Wheelchair: Manual Wheelchair activity did not occur: Safety/medical concerns  Wheelchair assist level: Supervision/Verbal cueing Max wheelchair distance: 100'    Wheelchair 50 feet with 2 turns activity    Assist    Wheelchair 50 feet with 2 turns activity did not occur: Safety/medical concerns   Assist Level: Supervision/Verbal cueing   Wheelchair 150 feet activity     Assist  Wheelchair 150 feet activity did not occur: Safety/medical concerns       Blood pressure 116/71, pulse 82, temperature 97.8 F (36.6 C), resp. rate 18, height $RemoveBe'5\' 11"'[id number]$  (1.803 m), weight 83 kg, SpO2 94 %.  Medical Problem List and Plan: 1.L frontal craniotomysecondary to LDH/SAH from fall -patient may Shower if incisions covered -ELOS/Goals: 2-3 weeks- mod I to supervision          -con't PT and OT- explained to pt, they are  concerned about "early d/c"_ that if he hasn't made enough gains, can prolong stay.  2. Impaired mobility: -DVT/anticoagulation: Pharmaceutical: Continue Lovenox -antiplatelet therapy: N/a 3. Low back pain: continue air mattress. Resolved. Dicussed XR findings of degeneration.   4/25- was having back pain- and R knee pain- if doesn't improve, will schedule Am pain meds  4/28- no complaints of pain- con't regimen  4/29- pain controlled- doesn't want med changes- con't regimen 4. Mood: LCSW to follow for evaluation and support.  -antipsychotic agents: N/A 5. Neuropsych: This patient is capable of making decisions on his own behalf. 6. Multiple deep tissue injuries/Skin/Wound Care: Continue Air mattress overlay with pressure relief measures.  --Continue vitamins and collagen to promote wound healing  7. Fluids/Electrolytes/Nutrition: Monitor I/O. Check lytes in am. 8. SDH (concerns  of bacterial infection): S/p Burr hole evacuation  9. Strep anginosis bacteremia: Continue Ceftriaxone/Metronidazole X 6 weeks w/end date  10 Liver abscess s/p JP drain placement: Continue flushes TID and document output.   -IR is following.  4/28- getting CT scan on abd/pelvis- with contrast- this AM- pending results-   4/29- looks a little better- con't IV ABX-  11. New onset seizures: Continue Keppra bid till follow up with neuro on outpatient basis.  12. Left hip ORIF: WBAT. Will consult ortho to eval given worsening pain. Added MS contin for better pain control/participation with therapy. Appears to have helped, continue.   4/26- will remove staples 13. T2DM: Hgb A1C- 10.8. Will monitor BS ac/hs. Home regimen Levemir 10u am/15u pm with SSI.  --continue Levemir 13u bid with SSI and titrate insulin as indicated.             -4/23 improved control  4/25- BGs 68-256 in last 24 hours- much more labile today- if still an issue tomorrow, will ask DM coordinators.    4/26- will consult DM coordinators  4/27- they suggested 70/30 16 units BID- so don't have to use novolog.   4/28- BGs- 95-226- but 226 was before got night time 70/30- so doing better overall- less labile  4/29- BGs 111-165- with 1 at 217- overall better control- con't regimen 14. BPH/Urinary retention: Foley replaced--keep in place for decompression for few days then start bladder program next week?.  --will increase flomax to 0.8 mg/HS  4/27- will d/c foley- and cath if needed.  4/28- has needed in/out caths- adding lidocaine jelly prn for cathing- explained will take some time to kick in.  4/29- not voiding yet- don't want to put foley back in, because bladder won't start to work if has foley- will at least try for  A few more days.     15. Pancreatic cancer with recurrence: Continue Creon with meals. --follow up with Hem/onc and Radiation Onc after discharge.  -wife asking about plans for radiation/chemo: will contact oncology to ask them to discuss with wife.  4/28- to start radiation 11/30/20 -is set up. At 1:30pm 16. Low back pain/muscle spasms- will start Robaxin prn and lidoderm patches 1- 8pm to 8am.              4/16: denies pain: decrease robaxin to q12H prn.  17. Hypokalemia: scheduled supplement and repeat BMP Monday 18. Overweight BMI 25.52: provide counseling.  19. Elevated alk phos: increasing,  Oncology notified. Oncology has discussed plans with wife for post- CIR follow-up.  20. Right sided pleural effusion: could be contributing to his chest pain. Consulted IR regarding drainage/pleural studies given active cancer. ADDENDUM: s/p successful thoracentesis  -pt denies any SOB  4/25- VRE grew from Pleural effusion- ID to see today- linezolid started.    4/26- seen by ID- will con't Linizolid  4/28- will check CMP/CBC/CRP/ESR qMonday.  4/29-  Pleural abscess has decreased in size per CT- con't ABX   LOS: 14 days A FACE TO FACE EVALUATION WAS  PERFORMED  Avila Albritton 11/26/2020, 8:26 AM

## 2020-11-26 NOTE — Telephone Encounter (Signed)
Attempted to speak with the patient's wife to inform her that we are moving her husband's radiation start date to 12/06/2020.  Her mailbox was full and I was unable to leave a message.  Rehab at [facility] made aware that he will not start on Monday and will cancel his scheduled transportation.  Gloriajean Dell. Leonie Green, BSN

## 2020-11-26 NOTE — Progress Notes (Addendum)
Patient ID: YISHAI REHFELD, male   DOB: 1956-04-18, 65 y.o.   MRN: [id number]  SW received updates from [place] [phone]) who reported that pt current cost for IV abx is $723.53 per week. Believes pt is likely in his GAP where he is required to pay 100% until claims hit his insurance, in which this could lower the amounts of his co-pay. Disucssed trying another infusion company to see if different rates. SW will follow-up once a decision is made.   SW spoke with Pam Chandler/[facility] to discuss review of pt. Will review and will follow-up with SW on cost.  *$81.61 per week with supplies if they select [facility] or [facility] as an agency due to an established contract. If not, and remains with [facility], it wil be $81.61 per week +$105 (supplies/per diem weekly rate)=$186.61 per week. SW informed Jeannene Patella will discuss with family on Monday in detail.   Loralee Pacas, MSW, [place] Office: [phone] Cell: [phone] Fax: [phone]

## 2020-11-26 NOTE — Progress Notes (Signed)
Referring Physician(s): Shalhoub, Governor Rooks [facility])  Supervising Physician: Arne Cleveland  Patient Status:  [facility] - In-pt  Chief Complaint: Hepatic abscess drain removal x2  Subjective:  Hepatic abscesses s/p hepatic drain placement x2 in IR 11/06/2020. Patient awake and alert sitting in wheelchair. States he is tired today. Hepatic abscess drain sites c/d/i.  CT abdomen/pelvis 11/25/2020: 1. Findings of empyema in the RIGHT chest with associated airspace disease. This has developed since prior imaging of April 9th of 2022 but is decreased in size based on comparison with chest x-ray from November 18, 2020 following aspiration. 2. Hepatic abscesses and or biloma, post drainage now resolved. 3. Post Whipple procedure with soft tissue about the celiac axis and common hepatic artery. Similar appearance of atrophy of the LEFT hepatic lobe. Findings are suspicious for disease recurrence with chronic occlusion of LEFT portal vein and extensive involvement of the celiac axis. 4. Post RIGHT femoral ORIF, recent postoperative changes with mild surrounding stranding, not unexpected and not significantly changed.   Allergies: Patient has no known allergies.  Medications: Prior to Admission medications   Medication Sig Start Date End Date Taking? Authorizing Provider  cefTRIAXone (ROCEPHIN) IVPB Inject 2 g into the vein every 12 (twelve) hours. Indication:  liver abscess and possible infected subdural hematoma First Dose: No Last Day of Therapy:  12/21/20 Labs - Once weekly:  CBC/D and BMP, Labs - Every other week:  ESR and CRP Method of administration: IV Push Method of administration may be changed at the discretion of home infusion pharmacist based upon assessment of the patient and/or caregiver's ability to self-administer the medication ordered. 11/12/20   Debbe Odea, MD  CREON 6000-19000 units CPEP Take 1 capsule by mouth with breakfast, with lunch, and with evening meal.  04/15/20    [provider]  enoxaparin (LOVENOX) 40 MG/0.4ML injection Inject 0.4 mLs (40 mg total) into the skin daily. 11/13/20   Debbe Odea, MD  ferrous sulfate 325 (65 FE) MG tablet Take 325 mg daily with breakfast by mouth.    [provider]  insulin detemir (LEVEMIR) 100 UNIT/ML injection Inject 0.1 mLs (10 Units total) into the skin 2 (two) times daily. 11/12/20   Debbe Odea, MD  levETIRAcetam (KEPPRA) 1000 MG tablet Take 1 tablet (1,000 mg total) by mouth 2 (two) times daily. 11/12/20   Debbe Odea, MD  loratadine (CLARITIN) 10 MG tablet Take 10 mg by mouth daily.    [provider]  metroNIDAZOLE (FLAGYL) 500 MG tablet Take 1 tablet (500 mg total) by mouth 3 (three) times daily. Treat through 12/21/20 11/12/20   Debbe Odea, MD  Multiple Vitamin (MULTIVITAMIN WITH MINERALS) TABS tablet Take 1 tablet by mouth daily.    [provider]  NOVOLOG FLEXPEN 100 UNIT/ML FlexPen GIVE EVERY MORNING WITH BREAKFAST AND EVERY EVENING WITH SUPPER PER SLIDING SCALE Patient taking differently: Inject 2-13 Units into the skin 2 (two) times daily. Sliding Scale : 101-150 - 2u 151-200 - 3u 201-250 - 5u 251-300 - 7u 301-350 - 9u 351- 400 - 11u 401-450 - 13u Greater than 150, alert MD 04/17/17   [name], MD  Omega-3 Fatty Acids (FISH OIL) 500 MG CAPS Take 500 mg by mouth daily.    [provider]  omeprazole (PRILOSEC) 20 MG capsule TAKE 1 CAPSULE DAILY 11/17/20   [name], MD  polyethylene glycol (MIRALAX / GLYCOLAX) 17 g packet Take 17 g by mouth daily as needed for mild constipation. 11/12/20  Debbe Odea, MD  rosuvastatin (CRESTOR) 20 MG tablet Take 1 tablet (20 mg total) by mouth daily. 07/22/20   [name], MD  tamsulosin (FLOMAX) 0.4 MG CAPS capsule Take 1 capsule (0.4 mg total) by mouth daily. 10/29/20   [place] Donnamarie Poag, DO     Vital Signs: BP 116/71 (BP Location: Left Arm)   Pulse 82   Temp 97.8 F (36.6 C)   Resp 18   Ht 5' 11"  (1.803 m)   Wt 182 lb 15.7 oz (83 kg)   SpO2 94%   BMI 25.52 kg/m   Physical Exam Vitals and nursing note reviewed.  Constitutional:      General: He is not in acute distress. Pulmonary:     Effort: Pulmonary effort is normal. No respiratory distress.  Abdominal:     Comments: Superior (#1) and inferior (#2) hepatic abscess drain (#1) sites without tenderness, erythema, drainage, or active bleeding; both drains with minimal output of clear yellow fluid in suction bulbs- both drains removed intact bedside, dressing applied (gauze + tape).  Skin:    General: Skin is warm and dry.  Neurological:     Mental Status: He is alert and oriented to person, place, and time.     Imaging: CT ABDOMEN PELVIS W CONTRAST  Result Date: 11/25/2020 CLINICAL DATA:  Intra-abdominal abscess, hepatic abscesses post percutaneous drainage. EXAM: CT ABDOMEN AND PELVIS WITH CONTRAST TECHNIQUE: Multidetector CT imaging of the abdomen and pelvis was performed using the standard protocol following bolus administration of intravenous contrast. CONTRAST:  158m OMNIPAQUE IOHEXOL 300 MG/ML  SOLN COMPARISON:  November 06, 2020 FINDINGS: Lower chest: Interval development of pleural fluid with peripheral enhancement in the RIGHT lung base. Basilar atelectasis. No LEFT-sided effusion. Pleural fluid measuring 9.6 x 4.4 cm. Hepatobiliary: Atrophic LEFT hepatic lobe with biliary duct dilation. The soft tissue extending into the hepatic hilum along the course of hepatic vasculature. The SMV and main portal vein remain patent with distortion of the portal vein as on recent imaging including outside [facility] which was acquired on August 31, 2020. Occlusion of intrahepatic LEFT portal vein is similar to prior imaging. There is some biliary duct dilation also affecting the RIGHT hepatic lobe to a lesser extent. Hepatic abscesses and or biloma is that were drained have resolved. Drainage catheters remain in place in  the RIGHT hemi liver. Pancreas: Post Whipple procedure. No pancreatic ductal dilation. Soft tissue about the celiac and extending in the porta hepatis as described. Spleen: Spleen is normal. Adrenals/Urinary Tract: Adrenal glands are normal. Symmetric renal enhancement.  No hydronephrosis. Stomach/Bowel: Post Whipple with alteration of gastrointestinal anatomy as expected. No acute small bowel process. Colon is filled with liquid stool and is nondistended. Appendix is normal. Vascular/Lymphatic: No discrete adenopathy in the upper abdomen with soft tissue about the celiac axis. No retroperitoneal adenopathy. No mesenteric adenopathy. LEFT portal occlusion. Irregularity of the common hepatic artery and celiac axis due to surrounding ill-defined soft tissue. Reproductive: Unremarkable Other: No ascites.  No free air. Musculoskeletal: Spinal degenerative changes. Osteopenia. Post RIGHT femoral ORIF, recent postoperative changes with mild surrounding stranding, not unexpected and not significantly changed. IMPRESSION: 1. Findings of empyema in the RIGHT chest with associated airspace disease. This has developed since prior imaging of April 9th of 2022 but is decreased in size based on comparison with chest x-ray from November 18, 2020 following aspiration. 2. Hepatic abscesses and or biloma, post drainage now resolved. 3. Post Whipple procedure with  soft tissue about the celiac axis and common hepatic artery. Similar appearance of atrophy of the LEFT hepatic lobe. Findings are suspicious for disease recurrence with chronic occlusion of LEFT portal vein and extensive involvement of the celiac axis. 4. Post RIGHT femoral ORIF, recent postoperative changes with mild surrounding stranding, not unexpected and not significantly changed. These results will be called to the ordering clinician or representative by the Radiologist Assistant, and communication documented in the PACS or [employer]. Electronically Signed   By:  Zetta Bills M.D.   On: 11/25/2020 17:26    Labs:  CBC: Recent Labs    11/10/20 0829 11/13/20 0447 11/15/20 0519 11/22/20 0503  WBC 11.0* 10.5 7.9 10.6*  HGB 10.9* 10.0* 9.9* 9.7*  HCT 33.5* 30.6* 30.7* 30.4*  PLT 469* 404* 470* 489*    COAGS: Recent Labs    10/19/20 0259 10/24/20 0814 11/05/20 1930 11/06/20 0337  INR 1.4* 1.2 1.5* 1.6*  APTT  --   --  32 36    BMP: Recent Labs    04/14/20 0925 07/09/20 0825 11/15/20 0519 11/16/20 0525 11/17/20 0524 11/22/20 0503  NA 141   < > 139 137 135 134*  K 3.3*   < > 3.2* 3.1* 3.6 3.7  CL 107   < > 105 103 102 100  CO2 27   < > _0 GLUCOSE 73   < > 215* 97 107* 66*  BUN 9   < > 8 7* 6* 6*  CALCIUM 8.8*   < > 8.1* 7.9* 8.1* 8.3*  CREATININE 0.71   < > 0.48* 0.42* 0.44* 0.42*  GFRNONAA >60   < > >60 >60 >60 >60  GFRAA >60  --   --   --   --   --    < > = values in this interval not displayed.    LIVER FUNCTION TESTS: Recent Labs    11/13/20 0447 11/15/20 0519 11/17/20 0524 11/22/20 0503  BILITOT 0.9 0.5 0.4 0.7  AST _1 12*  ALT _2 ALKPHOS 270* 660* 788* 354*  PROT 5.9* 6.0* 5.8* 6.3*  ALBUMIN 1.7* 1.7* 1.8* 1.9*    Assessment and Plan:  Hepatic abscesses s/p hepatic drain placement x2 in IR 11/06/2020. Hepatic abscess drains with minimal output per RN. CT abdomen/pelvis 11/25/2020 reviewed by Dr. Vernard Gambles who recommends removal of both drains. Hepatic abscess drains x2 removed intact bedside, no complications. EBL = 0 mL. Dressing (gauze + tegaderm) applied to sites. Patient tolerated procedure well.  Post-removal instructions: 1- Ok to shower 48 hours post-removal. 2- No submerging (swimming, bathing) for 7 days post-removal. 3- Ok to remove bandage after first shower, no further dressing changes needed- ensure area remains clean and dry until fully healed.  Further plans per PMR- appreciate and agree with management. Please call IR with questions/concerns.   Earley Abide  PA-C 11/26/2020 11:28 AM      Electronically Signed: Earley Abide, PA-C 11/26/2020, 11:23 AM   I spent a total of 15 Minutes at the the patient's bedside AND on the patient's hospital floor or unit, greater than 50% of which was counseling/coordinating care for hepatic abscess drain removal x2.

## 2020-11-26 NOTE — Progress Notes (Signed)
Occupational Therapy Session Note  Patient Details  Name: RED MANDT MRN: [id number] Date of Birth: [birth year]  Today's Date: 11/26/2020 OT Individual Time: 1300-1355 OT Individual Time Calculation (min): 55 min   Short Term Goals: Week 1:  OT Short Term Goal 1 (Week 1): P twill transfer with +1 A and LRAD ot BSC OT Short Term Goal 1 - Progress (Week 1): Progressing toward goal OT Short Term Goal 2 (Week 1): Pt will sit to stand consistently with MOD A in prep for clothing managment OT Short Term Goal 2 - Progress (Week 1): Progressing toward goal OT Short Term Goal 3 (Week 1): Pt will don shirt iwht MIN A OT Short Term Goal 3 - Progress (Week 1): Met OT Short Term Goal 4 (Week 1): Pt will groom seated EOB with S OT Short Term Goal 4 - Progress (Week 1): Progressing toward goal  Skilled Therapeutic Interventions/Progress Updates:    Pt greeted in bed, finishing up wound care with RN. His ADL needs were met, pt requesting to go outside during session. CGA for supine<sit where pt then donned his shoes with the same assistance while sitting EOB. Min A for sit<stand from elevated bed and for stand pivot transfer to the TIS using RW. Note that pt had poor eccentric control when lowering into w/c. While pt sat outside, he was guided through UE therapeutic exercises using the 3# bar 15-20 reps 2 sets each exercise. Pt required manual hand over hand cues at times to exhibit carryover of proper technique. We reviewed pressure relief education, encouraging pt to have caregiver direct change of tilt every 30 minutes. Once he returned to the room pt wanted to go back to bed. Mod A for sit<stand from low w/c surface and Min A for stand pivot<bed using RW. CGA for transition to supine and pt then boosted himself up using the upper bedrail. He remained in bed at close of session, all needs within reach and bed alarm set. Pt very appreciative of the opportunity to go outdoors today.   Therapy  Documentation Precautions:  Restrictions Weight Bearing Restrictions: No RLE Weight Bearing: Weight bearing as tolerated Pain: Pain Assessment Pain Score: 0-No pain ADL: ADL Grooming: Minimal assistance Where Assessed-Grooming: Edge of bed Upper Body Bathing: Minimal assistance Where Assessed-Upper Body Bathing:  (bsc) Lower Body Bathing: Maximal assistance (bsc) Upper Body Dressing: Moderate assistance Where Assessed-Upper Body Dressing:  (bsc) Lower Body Dressing: Dependent Where Assessed-Lower Body Dressing:  (BSC iwth stedy) Toileting: Dependent (stedy) Toilet Transfer: Dependent (stedy- MOD-MAX STS)     Therapy/Group: Individual Therapy  Maikol Grassia A Legacie Dillingham 11/26/2020, 3:31 PM

## 2020-11-26 NOTE — Progress Notes (Signed)
Physical Therapy Session Note  Patient Details  Name: Colton Bush MRN: [id number] Date of Birth: 1956/04/06  Today's Date: 11/26/2020 PT Individual Time: 0915-0944 PT Individual Time Calculation (min): 29 min   Short Term Goals: Week 1:  PT Short Term Goal 1 (Week 1): pt to demonstrate supine<>sit min A PT Short Term Goal 1 - Progress (Week 1): Progressing toward goal PT Short Term Goal 2 (Week 1): pt to demonstrate bed<>chair transfers mod A with LRAD PT Short Term Goal 2 - Progress (Week 1): Progressing toward goal PT Short Term Goal 3 (Week 1): pt to demonstrate WC mobility 75 min A PT Short Term Goal 3 - Progress (Week 1): Met PT Short Term Goal 4 (Week 1): to initiate gait PT Short Term Goal 4 - Progress (Week 1): Not met Week 2:  PT Short Term Goal 1 (Week 2): Pt will perform least restrictive transfer with mod A consistently PT Short Term Goal 2 (Week 2): Pt will initiate gait training PT Short Term Goal 3 (Week 2): Pt will perform bed mobility with min A consistently Week 3:     Skilled Therapeutic Interventions/Progress Updates:    Pain:  Pt reports R knee pain, did not quantify.  Treatment to tolerance.  Rest breaks and repositioning as needed.  Pt initially supine and agreeable to treatment session w/focus on functional mobility. Pt lifts feet for therapist to don socks in supine. Supine to sit on edge of bed w/use of rails, additional time, min assist. Therapist dons shoes w/pt sitting w/cga.  Pt c/o R knee pain w/activity, repositioning of limb and rest provided. stand pivot transfer bed to wc w/RW and min assist.   Once in wc, pt transported to hall for continued session.  Sit to stand from wc w/min assist.  Gait 29f w/RW w/cga to min assist, overall flexed posture, R knee buckling occurs without balance loss, pt c/o increasing pain.  wc provided.    Pt left oob in wc w/alarm belt set and needs in reach    Therapy Documentation Precautions:   Restrictions Weight Bearing Restrictions: No RLE Weight Bearing: Weight bearing as tolerated    Therapy/Group: Individual Therapy  BCallie Fielding PPoulan4/29/2022, 12:41 PM

## 2020-11-26 NOTE — Progress Notes (Signed)
Physical Therapy Session Note  Patient Details  Name: Colton Bush MRN: [id number] Date of Birth: 04-21-1956  Today's Date: 11/26/2020 PT Individual Time: 1115-1200 PT Individual Time Calculation (min): 45 min   Short Term Goals: Week 1:  PT Short Term Goal 1 (Week 1): pt to demonstrate supine<>sit min A PT Short Term Goal 1 - Progress (Week 1): Progressing toward goal PT Short Term Goal 2 (Week 1): pt to demonstrate bed<>chair transfers mod A with LRAD PT Short Term Goal 2 - Progress (Week 1): Progressing toward goal PT Short Term Goal 3 (Week 1): pt to demonstrate WC mobility 75 min A PT Short Term Goal 3 - Progress (Week 1): Met PT Short Term Goal 4 (Week 1): to initiate gait PT Short Term Goal 4 - Progress (Week 1): Not met Week 2:  PT Short Term Goal 1 (Week 2): Pt will perform least restrictive transfer with mod A consistently PT Short Term Goal 2 (Week 2): Pt will initiate gait training PT Short Term Goal 3 (Week 2): Pt will perform bed mobility with min A consistently Week 3:     Skilled Therapeutic Interventions/Progress Updates:    pt received in [employer] and agreeable to therapy. Pt reported 2/10 pain in R knee at start and end of session. Pt directed in x5 Sit to stand from [facility] to Rolling walker min A regressing to mod A on final rep 2/2 fatigue. Pt then able to tolerate standing 1-2 mins each time with Rolling walker for stability before rest break needed. Pt directed in gait training with Rolling walker post rest break in sitting and nursing entering room for medications. Pt directed in 10' -15' x4 with min A for stability and VC for increased step length on RLE and trunk extension. Pt directed min -mod A for all transfer for gait training. Pt fatigued quickly and requested seated rest breaks post each rep of gait. Pt then directed in Stand pivot transfer to bedside as pt requested to return to bed for rest, min A for Stand pivot transfer. Pt then CGA for sit>supine and positioning in  bed. Pt then left in bed, All needs in reach and in good condition. Call light in hand.  And alarm set at end of session.   Therapy Documentation Precautions:  Restrictions Weight Bearing Restrictions: No RLE Weight Bearing: Weight bearing as tolerated General: PT Amount of Missed Time (min): 15 Minutes PT Missed Treatment Reason: Wound care (pt with IR having drains removed) Vital Signs:   Pain:   Mobility:   Locomotion :    Trunk/Postural Assessment :    Balance:   Exercises:   Other Treatments:      Therapy/Group: Individual Therapy  Junie Panning 11/26/2020, 2:00 PM

## 2020-11-27 LAB — GLUCOSE, CAPILLARY
Glucose-Capillary: 118 mg/dL — ABNORMAL HIGH (ref 70–99)
Glucose-Capillary: 173 mg/dL — ABNORMAL HIGH (ref 70–99)
Glucose-Capillary: 188 mg/dL — ABNORMAL HIGH (ref 70–99)
Glucose-Capillary: 208 mg/dL — ABNORMAL HIGH (ref 70–99)
Glucose-Capillary: 208 mg/dL — ABNORMAL HIGH (ref 70–99)

## 2020-11-27 NOTE — Plan of Care (Signed)
  Problem: Consults Goal: RH BRAIN INJURY PATIENT EDUCATION Description: Description: See Patient Education module for eduction specifics Outcome: Progressing Goal: Skin Care Protocol Initiated - if Braden Score 18 or less Description: If consults are not indicated, leave blank or document N/A Outcome: Progressing   Problem: RH BOWEL ELIMINATION Goal: RH STG MANAGE BOWEL WITH ASSISTANCE Description: STG Manage Bowel with Supervision Assistance. Outcome: Progressing Goal: RH STG MANAGE BOWEL W/MEDICATION W/ASSISTANCE Description: STG Manage Bowel with Medication with Supervision Assistance. Outcome: Progressing   Problem: RH BLADDER ELIMINATION Goal: RH STG MANAGE BLADDER WITH ASSISTANCE Description: STG Manage Bladder With Supervision Assistance Outcome: Progressing Goal: RH STG MANAGE BLADDER WITH MEDICATION WITH ASSISTANCE Description: STG Manage Bladder With Medication With Supervision Assistance. Outcome: Progressing   Problem: RH SKIN INTEGRITY Goal: RH STG SKIN FREE OF INFECTION/BREAKDOWN Description: Remain free from additional breakdown with supervision assistance while on rehab.  Outcome: Progressing Goal: RH STG MAINTAIN SKIN INTEGRITY WITH ASSISTANCE Description: STG Maintain Skin Integrity With Supervision Assistance. Outcome: Progressing Goal: RH STG ABLE TO PERFORM INCISION/WOUND CARE W/ASSISTANCE Description: STG Able To Perform Incision/Wound Care With Supervision Assistance. Outcome: Progressing   Problem: RH SAFETY Goal: RH STG ADHERE TO SAFETY PRECAUTIONS W/ASSISTANCE/DEVICE Description: STG Adhere to Safety Precautions With Supervision Assistance/Device. Outcome: Progressing Goal: RH STG DECREASED RISK OF FALL WITH ASSISTANCE Description: STG Decreased Risk of Fall With Supervision Assistance. Outcome: Progressing   Problem: RH PAIN MANAGEMENT Goal: RH STG PAIN MANAGED AT OR BELOW PT'S PAIN GOAL Description: <4 on a 0-10 pain scale. Outcome:  Progressing   Problem: RH KNOWLEDGE DEFICIT BRAIN INJURY Goal: RH STG INCREASE KNOWLEDGE OF SELF CARE AFTER BRAIN INJURY Description: Patient will be able to demonstrate knowledge of medication management, pain management, skin/wound care, weight bearing precautions, diabetes management with educational materials and handouts provided by staff, at discharge independently. Outcome: Progressing   

## 2020-11-27 NOTE — Progress Notes (Signed)
Pt bladder scan shows 424ml. Nurse asked pt if he wanted to try and urinate, but pt declined and requested to give him 45 min to see if he needed to go before doing an int cath as ordered.

## 2020-11-27 NOTE — Progress Notes (Signed)
Occupational Therapy Session Note  Patient Details  Name: Colton Bush MRN: [id number] Date of Birth: Nov 06, 1955  Today's Date: 11/27/2020 OT Individual Time: 0906-1004 OT Individual Time Calculation (min): 58 min    Short Term Goals: Week 3:  OT Short Term Goal 1 (Week 3): LTG=STG 2/2 ELOS  Skilled Therapeutic Interventions/Progress Updates:    Pt received semi-reclined in bed, denies pain, mildly despondent but agreeable to therapy. Donned shirt min A to pull over head 2/2 time management. IV RN present to put in IV. Donned pants min A to thread over R foot, min A for STS with RW from elevated bed. Donned B socks mod A to don R sock. Squat-pivot CGA to TIS. Completed oral and hair care set-up A at sink. Donned B shoes min A to adjust shoes over heels. Stood x3 to play checkers, req min A to power up. Able to stand for ~1.5 min at a time to focus on cognitive task req seated rest break in between bouts. Better affect noted at end of session. Pt electing to remain in [place].  Pt left tilted back in TIS  With chair alarm engaged, call bell in reach, and all immediate needs met.    Therapy Documentation Precautions:  Restrictions Weight Bearing Restrictions: No RLE Weight Bearing: Weight bearing as tolerated Pain: Pain Assessment Pain Scale: 0-10 Pain Score: 0-No pain ADL: See Care Tool for more details.   Therapy/Group: Individual Therapy  Volanda Napoleon MS, OTR/L  11/27/2020, 12:36 PM

## 2020-11-27 NOTE — Progress Notes (Signed)
PROGRESS NOTE   Subjective: No complaints this morning Working with therapy.   ROS:   Pt denies SOB, abd pain, CP, N/V/C/D, and vision changes Pain with cathing- not voiding  Objective:   CT ABDOMEN PELVIS W CONTRAST  Result Date: 11/25/2020 CLINICAL DATA:  Intra-abdominal abscess, hepatic abscesses post percutaneous drainage. EXAM: CT ABDOMEN AND PELVIS WITH CONTRAST TECHNIQUE: Multidetector CT imaging of the abdomen and pelvis was performed using the standard protocol following bolus administration of intravenous contrast. CONTRAST:  167m OMNIPAQUE IOHEXOL 300 MG/ML  SOLN COMPARISON:  November 06, 2020 FINDINGS: Lower chest: Interval development of pleural fluid with peripheral enhancement in the RIGHT lung base. Basilar atelectasis. No LEFT-sided effusion. Pleural fluid measuring 9.6 x 4.4 cm. Hepatobiliary: Atrophic LEFT hepatic lobe with biliary duct dilation. The soft tissue extending into the hepatic hilum along the course of hepatic vasculature. The SMV and main portal vein remain patent with distortion of the portal vein as on recent imaging [facility], 2020. Occlusion of intrahepatic LEFT portal vein is similar to prior imaging. There is some biliary duct dilation also affecting the RIGHT hepatic lobe to a lesser extent. Hepatic abscesses and or biloma is that were drained have resolved. Drainage catheters remain in place in the RIGHT hemi liver. Pancreas: Post Whipple procedure. No pancreatic ductal dilation. Soft tissue about the celiac and extending in the porta hepatis as described. Spleen: Spleen is normal. Adrenals/Urinary Tract: Adrenal glands are normal. Symmetric renal enhancement.  No hydronephrosis. Stomach/Bowel: Post Whipple with alteration of gastrointestinal anatomy as expected. No acute small bowel process. Colon is filled with liquid stool and is  nondistended. Appendix is normal. Vascular/Lymphatic: No discrete adenopathy in the upper abdomen with soft tissue about the celiac axis. No retroperitoneal adenopathy. No mesenteric adenopathy. LEFT portal occlusion. Irregularity of the common hepatic artery and celiac axis due to surrounding ill-defined soft tissue. Reproductive: Unremarkable Other: No ascites.  No free air. Musculoskeletal: Spinal degenerative changes. Osteopenia. Post RIGHT femoral ORIF, recent postoperative changes with mild surrounding stranding, not unexpected and not significantly changed. IMPRESSION: 1. Findings of empyema in the RIGHT chest with associated airspace disease. This has developed since prior imaging of April 9th of 2022 but is decreased in size based on comparison with chest x-ray from November 18, 2020 following aspiration. 2. Hepatic abscesses and or biloma, post drainage now resolved. 3. Post Whipple procedure with soft tissue about the celiac axis and common hepatic artery. Similar appearance of atrophy of the LEFT hepatic lobe. Findings are suspicious for disease recurrence with chronic occlusion of LEFT portal vein and extensive involvement of the celiac axis. 4. Post RIGHT femoral ORIF, recent postoperative changes with mild surrounding stranding, not unexpected and not significantly changed. These results will be called to the ordering clinician or representative by the Radiologist Assistant, and communication documented in the PACS or [employer] Electronically Signed   By: GZetta BillsM.D.   On: 11/25/2020 17:26   No results for input(s): WBC, HGB, HCT, PLT in the last 72 hours. No results for input(s): NA, K, CL, CO2, GLUCOSE, BUN, CREATININE, CALCIUM in the last 72  hours.  Intake/Output Summary (Last 24 hours) at 11/27/2020 1403 Last data filed at 11/27/2020 1102 Gross per 24 hour  Intake 104 ml  Output 1339 ml  Net -1235 ml     Pressure Injury 11/07/20 Heel Right Stage 1 -  Intact skin with  non-blanchable redness of a localized area usually over a bony prominence. (Active)  11/07/20 0800  Location: Heel  Location Orientation: Right  Staging: Stage 1 -  Intact skin with non-blanchable redness of a localized area usually over a bony prominence.  Wound Description (Comments):   Present on Admission:      Pressure Injury 11/07/20 Sacrum Stage 2 -  Partial thickness loss of dermis presenting as a shallow open injury with a red, pink wound bed without slough. 4/15 skin is now broken; was documented as stage 1. now a stage 2. (Active)  11/07/20 2000  Location: Sacrum  Location Orientation:   Staging: Stage 2 -  Partial thickness loss of dermis presenting as a shallow open injury with a red, pink wound bed without slough.  Wound Description (Comments): 4/15 skin is now broken; was documented as stage 1. now a stage 2.  Present on Admission:      Pressure Injury 11/07/20 Heel Right Deep Tissue Pressure Injury - Purple or maroon localized area of discolored intact skin or blood-filled blister due to damage of underlying soft tissue from pressure and/or shear. (Active)  11/07/20 2000  Location: Heel  Location Orientation: Right  Staging: Deep Tissue Pressure Injury - Purple or maroon localized area of discolored intact skin or blood-filled blister due to damage of underlying soft tissue from pressure and/or shear.  Wound Description (Comments):   Present on Admission:     Physical Exam: Vital Signs Blood pressure (!) 142/80, pulse 82, temperature 98 F (36.7 C), resp. rate 18, height 5' 11"  (1.803 m), weight 78 kg, SpO2 94 %.  Gen: no distress, normal appearing HEENT: oral mucosa pink and moist, NCAT Cardio: Reg rate Chest: normal effort, normal rate of breathing Abd: soft, non-distended Ext: no clubbing, cyanosis, or edema Genitourinary:foley out- requiring in/out caths Musculoskeletal:  Cervical back: Normal range of motionand neck supple.  Comments: RLE staples out.    UE's 5-/5 but equal B/L  LE's 4+/5 in HF, KE, DF and PF B/L  Skin: General: Skin is warmand dry.  Comments: RLE incisions as well as abd incisions already noted- staples still in place Neurological:  Mental Status: He is oriented to person, place, and time.  Comments: Soft voice with mild left facial weakness. Slow to respond but able to follow basic motor commands.  Delayed but reasonable processing and speech today Intact to light touch in all 4 extremities     Assessment/Plan: 1. Functional deficits which require 3+ hours per day of interdisciplinary therapy in a comprehensive inpatient rehab setting.  Physiatrist is providing close team supervision and 24 hour management of active medical problems listed below.  Physiatrist and rehab team continue to assess barriers to discharge/monitor patient progress toward functional and medical goals  Care Tool:  Bathing    Body parts bathed by patient: Right arm,Left arm,Chest,Abdomen,Face   Body parts bathed by helper: Front perineal area,Buttocks,Right upper leg,Left upper leg,Right lower leg,Left lower leg     Bathing assist Assist Level: Maximal Assistance - Patient 24 - 49%     Upper Body Dressing/Undressing Upper body dressing   What is the patient wearing?: Pull over shirt    Upper body assist Assist Level: Minimal  Assistance - Patient > 75%    Lower Body Dressing/Undressing Lower body dressing      What is the patient wearing?: Pants,Underwear/pull up     Lower body assist Assist for lower body dressing: Minimal Assistance - Patient > 75%     Toileting Toileting    Toileting assist Assist for toileting: Dependent - Patient 0%     Transfers Chair/bed transfer  Transfers assist     Chair/bed transfer assist level: Contact Guard/Touching assist     Locomotion Ambulation   Ambulation assist   Ambulation activity did not occur: Safety/medical concerns  Assist level: 2  helpers Assistive device: Walker-rolling Max distance: 30'   Walk 10 feet activity   Assist  Walk 10 feet activity did not occur: Safety/medical concerns  Assist level: 2 helpers Assistive device: Walker-rolling   Walk 50 feet activity   Assist Walk 50 feet with 2 turns activity did not occur: Safety/medical concerns         Walk 150 feet activity   Assist Walk 150 feet activity did not occur: Safety/medical concerns         Walk 10 feet on uneven surface  activity   Assist Walk 10 feet on uneven surfaces activity did not occur: Safety/medical concerns         Wheelchair     Assist Will patient use wheelchair at discharge?: Yes Type of Wheelchair: Manual Wheelchair activity did not occur: Safety/medical concerns  Wheelchair assist level: Supervision/Verbal cueing Max wheelchair distance: 100'    Wheelchair 50 feet with 2 turns activity    Assist    Wheelchair 50 feet with 2 turns activity did not occur: Safety/medical concerns   Assist Level: Supervision/Verbal cueing   Wheelchair 150 feet activity     Assist  Wheelchair 150 feet activity did not occur: Safety/medical concerns       Blood pressure (!) 142/80, pulse 82, temperature 98 F (36.7 C), resp. rate 18, height 5' 11"  (1.803 m), weight 78 kg, SpO2 94 %.  Medical Problem List and Plan: 1.L frontal craniotomysecondary to LDH/SAH from fall -patient may Shower if incisions covered -ELOS/Goals: 2-3 weeks- mod I to supervision          -continue PT and OT- explained to pt, they are concerned about "early d/c"_ that if he hasn't made enough gains, can prolong stay.  2. Impaired mobility: -DVT/anticoagulation: Pharmaceutical: Continue Lovenox -antiplatelet therapy: N/a 3. Low back pain: continue air mattress. Resolved. Dicussed XR findings of degeneration.   4/25- was having back pain- and R knee pain- if doesn't improve, will schedule Am  pain meds  4/28- no complaints of pain- con't regimen  4/30- pain controlled- doesn't want med changes- continue regimen 4. Mood: LCSW to follow for evaluation and support.  -antipsychotic agents: N/A 5. Neuropsych: This patient is capable of making decisions on his own behalf. 6. Multiple deep tissue injuries/Skin/Wound Care: Continue Air mattress overlay with pressure relief measures.  --Continue vitamins and collagen to promote wound healing  7. Fluids/Electrolytes/Nutrition: Monitor I/O. Check lytes in am. 8. SDH (concerns of bacterial infection): S/p Burr hole evacuation  9. Strep anginosis bacteremia: Continue Ceftriaxone/Metronidazole X 6 weeks w/end date  10 Liver abscess s/p JP drain placement: Continue flushes TID and document output.   -IR is following.  4/28- getting CT scan on abd/pelvis- with contrast- this AM- pending results-   4/29- looks a little better- con't IV ABX-  11. New onset seizures: Continue Keppra bid till follow up with  neuro on outpatient basis.  12. Left hip ORIF: WBAT. Will consult ortho to eval given worsening pain. Added MS contin for better pain control/participation with therapy. Appears to have helped, continue.   4/26- will remove staples 13. T2DM: Hgb A1C- 10.8. Will monitor BS ac/hs. Home regimen Levemir 10u am/15u pm with SSI.  --continue Levemir 13u bid with SSI and titrate insulin as indicated.             -4/23 improved control  4/25- BGs 68-256 in last 24 hours- much more labile today- if still an issue tomorrow, will ask DM coordinators.   4/26- will consult DM coordinators  4/27- they suggested 70/30 16 units BID- so don't have to use novolog.   4/28- BGs- 95-226- but 226 was before got night time 70/30- so doing better overall- less labile  4/30- BGs 90-217- overall better control- continue regimen 14. BPH/Urinary retention: Foley replaced--keep in place for decompression for few days then start bladder  program next week?.  --will increase flomax to 0.8 mg/HS  4/27- will d/c foley- and cath if needed.  4/28- has needed in/out caths- adding lidocaine jelly prn for cathing- explained will take some time to kick in.  4/29- not voiding yet- don't want to put foley back in, because bladder won't start to work if has foley- will at least try for  A few more days.     15. Pancreatic cancer with recurrence: Continue Creon with meals. --follow up with Hem/onc and Radiation Onc after discharge.  -wife asking about plans for radiation/chemo: will contact oncology to ask them to discuss with wife.  4/28- to start radiation 11/30/20 -is set up. At 1:30pm 16. Low back pain/muscle spasms- will start Robaxin prn and lidoderm patches 1- 8pm to 8am.              4/16: denies pain: decrease robaxin to q12H prn.  17. Hypokalemia: scheduled supplement and repeat BMP Monday 18. Overweight BMI 25.52: provide counseling.  19. Elevated alk phos: increasing,  Oncology notified. Oncology has discussed plans with wife for post- CIR follow-up.  20. Right sided pleural effusion: could be contributing to his chest pain. Consulted IR regarding drainage/pleural studies given active cancer. ADDENDUM: s/p successful thoracentesis  -pt denies any SOB  4/25- VRE grew from Pleural effusion- ID to see today- linezolid started.    4/26- seen by ID- will con't Linizolid  4/28- will check CMP/CBC/CRP/ESR qMonday.  4/29-  Pleural abscess has decreased in size per CT- con't ABX  21. Hypertension: continue to monitor BP TID  LOS: 15 days A FACE TO FACE EVALUATION WAS PERFORMED  Martha Clan P Shreya Lacasse 11/27/2020, 2:03 PM

## 2020-11-27 NOTE — Progress Notes (Signed)
Occupational Therapy Session Note  Patient Details  Name: KHAMRON GELLERT MRN: [id number] Date of Birth: Feb 20, 1956  Today's Date: 11/27/2020 OT Group Time: 1120-1200 OT Group Time Calculation (min): 40 min 20 minutes missed  Skilled Therapeutic Interventions/Progress Updates:    Pt engaged in therapeutic w/c level dance group focusing on patient choice, UE/LE strengthening, salience, activity tolerance, and social participation. Pt was guided through various dance-based exercises involving UEs/LEs and trunk. All music was selected by group members. Emphasis placed on general strengthening and UE coordination. Pt required max encouragement to participate, appeared sleepy and stated he was tired due to previous therapies. He mostly tapped his Rt foot. At end of session pt was returned to the room by OT.    Therapy Documentation Precautions:  Restrictions Weight Bearing Restrictions: No RLE Weight Bearing: Weight bearing as tolerated Pain: no s/s pain during tx Pain Assessment Pain Scale: 0-10 Pain Score: 0-No pain ADL: ADL Grooming: Minimal assistance Where Assessed-Grooming: Edge of bed Upper Body Bathing: Minimal assistance Where Assessed-Upper Body Bathing:  (bsc) Lower Body Bathing: Maximal assistance (bsc) Upper Body Dressing: Moderate assistance Where Assessed-Upper Body Dressing:  (bsc) Lower Body Dressing: Dependent Where Assessed-Lower Body Dressing:  (BSC iwth stedy) Toileting: Dependent (stedy) Toilet Transfer: Dependent (stedy- MOD-MAX STS)     Therapy/Group: Group Therapy  Sari Cogan A Edlyn Rosenburg 11/27/2020, 12:57 PM

## 2020-11-28 LAB — GLUCOSE, CAPILLARY
Glucose-Capillary: 137 mg/dL — ABNORMAL HIGH (ref 70–99)
Glucose-Capillary: 127 mg/dL — ABNORMAL HIGH (ref 70–99)
Glucose-Capillary: 244 mg/dL — ABNORMAL HIGH (ref 70–99)
Glucose-Capillary: 111 mg/dL — ABNORMAL HIGH (ref 70–99)

## 2020-11-28 MED ORDER — INSULIN ASPART PROT & ASPART (70-30 MIX) 100 UNIT/ML ~~LOC~~ SUSP
17.0000 [IU] | Freq: Two times a day (BID) | SUBCUTANEOUS | Status: DC
  Administered 2020-11-28 – 2020-11-30 (×4): 17 [IU] via SUBCUTANEOUS
  Filled 2020-11-28: qty 10

## 2020-11-28 NOTE — Plan of Care (Signed)
  Problem: Consults Goal: RH BRAIN INJURY PATIENT EDUCATION Description: Description: See Patient Education module for eduction specifics Outcome: Progressing Goal: Skin Care Protocol Initiated - if Braden Score 18 or less Description: If consults are not indicated, leave blank or document N/A Outcome: Progressing   Problem: RH BOWEL ELIMINATION Goal: RH STG MANAGE BOWEL WITH ASSISTANCE Description: STG Manage Bowel with Supervision Assistance. Outcome: Progressing Goal: RH STG MANAGE BOWEL W/MEDICATION W/ASSISTANCE Description: STG Manage Bowel with Medication with Supervision Assistance. Outcome: Progressing   Problem: RH BLADDER ELIMINATION Goal: RH STG MANAGE BLADDER WITH ASSISTANCE Description: STG Manage Bladder With Supervision Assistance Outcome: Progressing Goal: RH STG MANAGE BLADDER WITH MEDICATION WITH ASSISTANCE Description: STG Manage Bladder With Medication With Supervision Assistance. Outcome: Progressing   Problem: RH SKIN INTEGRITY Goal: RH STG SKIN FREE OF INFECTION/BREAKDOWN Description: Remain free from additional breakdown with supervision assistance while on rehab.  Outcome: Progressing Goal: RH STG MAINTAIN SKIN INTEGRITY WITH ASSISTANCE Description: STG Maintain Skin Integrity With Supervision Assistance. Outcome: Progressing Goal: RH STG ABLE TO PERFORM INCISION/WOUND CARE W/ASSISTANCE Description: STG Able To Perform Incision/Wound Care With Supervision Assistance. Outcome: Progressing   Problem: RH SAFETY Goal: RH STG ADHERE TO SAFETY PRECAUTIONS W/ASSISTANCE/DEVICE Description: STG Adhere to Safety Precautions With Supervision Assistance/Device. Outcome: Progressing Goal: RH STG DECREASED RISK OF FALL WITH ASSISTANCE Description: STG Decreased Risk of Fall With Supervision Assistance. Outcome: Progressing   Problem: RH PAIN MANAGEMENT Goal: RH STG PAIN MANAGED AT OR BELOW PT'S PAIN GOAL Description: <4 on a 0-10 pain scale. Outcome:  Progressing   Problem: RH KNOWLEDGE DEFICIT BRAIN INJURY Goal: RH STG INCREASE KNOWLEDGE OF SELF CARE AFTER BRAIN INJURY Description: Patient will be able to demonstrate knowledge of medication management, pain management, skin/wound care, weight bearing precautions, diabetes management with educational materials and handouts provided by staff, at discharge independently. Outcome: Progressing   

## 2020-11-28 NOTE — Progress Notes (Signed)
Physical Therapy Weekly Progress Note  Patient Details  Name: Colton Bush MRN: [id number] Date of Birth: 12/18/1955  Beginning of progress report period: November 21, 2020 End of progress report period: Nov 28, 2020  Today's Date: 11/28/2020 PT Individual Time: 1400-1445 PT Individual Time Calculation (min): 45 min   Patient has met 3 of 3 short term goals.  Pt is beginning to make good progress towards therapy goals. He is currently at Supervision level for bed mobility with use of a bedrail, min to mod A for sit to stand and stand pivot transfers with RW, and has been able to initiate gait training. He is able to ambulate up to 35 ft with RW and min A for balance with a close w/c follow from a 2nd person for safety. Pt continues to exhibit decreased insight into his deficits and functional implications as well as decreased safety awareness and some impulsivity at times. Patient's wife will come in for family education this next week leading up to d/c to determine if patient is reaching a level at which he can be safe at home and she can provide the level of assist that he will require.  Patient continues to demonstrate the following deficits muscle weakness and muscle joint tightness, decreased cardiorespiratoy endurance, decreased initiation, decreased attention, decreased awareness, decreased problem solving, decreased safety awareness, decreased memory and delayed processing and decreased sitting balance, decreased standing balance, decreased postural control and decreased balance strategies and therefore will continue to benefit from skilled PT intervention to increase functional independence with mobility.  Patient progressing toward long term goals..  Continue plan of care.  PT Short Term Goals Week 3:  PT Short Term Goal 1 (Week 3): Pt will complete least restrictive transfer with min A consistently PT Short Term Goal 2 (Week 3): Pt will ambulate x 50 ft with LRAD and assist x 1 PT Short  Term Goal 3 (Week 3): Pt will initiate stair training  Skilled Therapeutic Interventions/Progress Updates:    Pt received supine in bed finishing using urinal, agreeable to PT session. No complaints of pain at rest, has some R knee pain with mobility that improves again at rest. Pt found on bedpan, min A for rolling L/R, dependent for pericare and donning of new brief and pants. See Flowsheet for void. Supine to sit with Supervision from a flat bed with use of bedrail. Sit to stand with min A to RW throughout session. Stand pivot transfer to w/c with RW and min A. Ambulation 2 x 35 ft, 1 x 30 ft with RW and min A for balance with close w/c follow for safety. Pt continues to exhibit narrow BOS that increases with onset of fatigue as well as flexed trunk posture, able to correct with cues. Pt exhibits decreased impulsivity with gait training this date. Pt agreeable to stay seated in TIS at end of session, chair reclined, needs in reach and chair alarm in place.  Therapy Documentation Precautions:  Restrictions Weight Bearing Restrictions: No RLE Weight Bearing: Weight bearing as tolerated  Therapy/Group: Individual Therapy   Excell Seltzer, PT, DPT, CSRS  11/28/2020, 3:27 PM

## 2020-11-28 NOTE — Progress Notes (Signed)
PROGRESS NOTE   Subjective: No complaints this morning IV in left arm.  Has labs ordered for tomorrow Patient's chart reviewed- No issues reported overnight Vitals signs stable except for increased RR  ROS:   Pt denies SOB, abd pain, CP, N/V/C/D, and vision changes Pain with cathing- not voiding  Objective:   No results found. No results for input(s): WBC, HGB, HCT, PLT in the last 72 hours. No results for input(s): NA, K, CL, CO2, GLUCOSE, BUN, CREATININE, CALCIUM in the last 72 hours.  Intake/Output Summary (Last 24 hours) at 11/28/2020 1147 Last data filed at 11/28/2020 1115 Gross per 24 hour  Intake 116.2 ml  Output 1600 ml  Net -1483.8 ml     Pressure Injury 11/07/20 Heel Right Stage 1 -  Intact skin with non-blanchable redness of a localized area usually over a bony prominence. (Active)  11/07/20 0800  Location: Heel  Location Orientation: Right  Staging: Stage 1 -  Intact skin with non-blanchable redness of a localized area usually over a bony prominence.  Wound Description (Comments):   Present on Admission:      Pressure Injury 11/07/20 Sacrum Stage 2 -  Partial thickness loss of dermis presenting as a shallow open injury with a red, pink wound bed without slough. 4/15 skin is now broken; was documented as stage 1. now a stage 2. (Active)  11/07/20 2000  Location: Sacrum  Location Orientation:   Staging: Stage 2 -  Partial thickness loss of dermis presenting as a shallow open injury with a red, pink wound bed without slough.  Wound Description (Comments): 4/15 skin is now broken; was documented as stage 1. now a stage 2.  Present on Admission:      Pressure Injury 11/07/20 Heel Right Deep Tissue Pressure Injury - Purple or maroon localized area of discolored intact skin or blood-filled blister due to damage of underlying soft tissue from pressure and/or shear. (Active)  11/07/20 2000  Location: Heel  Location  Orientation: Right  Staging: Deep Tissue Pressure Injury - Purple or maroon localized area of discolored intact skin or blood-filled blister due to damage of underlying soft tissue from pressure and/or shear.  Wound Description (Comments):   Present on Admission:     Physical Exam: Vital Signs Blood pressure 134/74, pulse 80, temperature (!) 97.5 F (36.4 C), temperature source Oral, resp. rate (!) 21, height 5' 11"  (1.803 m), weight 78 kg, SpO2 94 %.  Gen: no distress, normal appearing HEENT: oral mucosa pink and moist, NCAT Cardio: Reg rate Chest: normal effort, normal rate of breathing Abd: soft, non-distended Ext: no edema Psych: pleasant, normal affect Genitourinary:foley out- requiring in/out caths Musculoskeletal:  Cervical back: Normal range of motionand neck supple.  Comments: RLE staples out.   UE's 5-/5 but equal B/L  LE's 4+/5 in HF, KE, DF and PF B/L  Skin: General: Skin is warmand dry.  Comments: RLE incisions as well as abd incisions already noted- staples still in place Neurological:  Mental Status: He is oriented to person, place, and time.  Comments: Soft voice with mild left facial weakness. Slow to respond but able to follow basic motor commands.  Delayed but reasonable processing  and speech today Intact to light touch in all 4 extremities     Assessment/Plan: 1. Functional deficits which require 3+ hours per day of interdisciplinary therapy in a comprehensive inpatient rehab setting.  Physiatrist is providing close team supervision and 24 hour management of active medical problems listed below.  Physiatrist and rehab team continue to assess barriers to discharge/monitor patient progress toward functional and medical goals  Care Tool:  Bathing    Body parts bathed by patient: Right arm,Left arm,Chest,Abdomen,Face   Body parts bathed by helper: Front perineal area,Buttocks,Right upper leg,Left upper leg,Right lower leg,Left lower  leg     Bathing assist Assist Level: Maximal Assistance - Patient 24 - 49%     Upper Body Dressing/Undressing Upper body dressing   What is the patient wearing?: Pull over shirt    Upper body assist Assist Level: Minimal Assistance - Patient > 75%    Lower Body Dressing/Undressing Lower body dressing      What is the patient wearing?: Pants,Underwear/pull up     Lower body assist Assist for lower body dressing: Minimal Assistance - Patient > 75%     Toileting Toileting    Toileting assist Assist for toileting: Dependent - Patient 0%     Transfers Chair/bed transfer  Transfers assist     Chair/bed transfer assist level: Contact Guard/Touching assist     Locomotion Ambulation   Ambulation assist   Ambulation activity did not occur: Safety/medical concerns  Assist level: 2 helpers Assistive device: Walker-rolling Max distance: 30'   Walk 10 feet activity   Assist  Walk 10 feet activity did not occur: Safety/medical concerns  Assist level: 2 helpers Assistive device: Walker-rolling   Walk 50 feet activity   Assist Walk 50 feet with 2 turns activity did not occur: Safety/medical concerns         Walk 150 feet activity   Assist Walk 150 feet activity did not occur: Safety/medical concerns         Walk 10 feet on uneven surface  activity   Assist Walk 10 feet on uneven surfaces activity did not occur: Safety/medical concerns         Wheelchair     Assist Will patient use wheelchair at discharge?: Yes Type of Wheelchair: Manual Wheelchair activity did not occur: Safety/medical concerns  Wheelchair assist level: Supervision/Verbal cueing Max wheelchair distance: 100'    Wheelchair 50 feet with 2 turns activity    Assist    Wheelchair 50 feet with 2 turns activity did not occur: Safety/medical concerns   Assist Level: Supervision/Verbal cueing   Wheelchair 150 feet activity     Assist  Wheelchair 150 feet  activity did not occur: Safety/medical concerns       Blood pressure 134/74, pulse 80, temperature (!) 97.5 F (36.4 C), temperature source Oral, resp. rate (!) 21, height 5' 11"  (1.803 m), weight 78 kg, SpO2 94 %.  Medical Problem List and Plan: 1.L frontal craniotomysecondary to LDH/SAH from fall -patient may Shower if incisions covered -ELOS/Goals: 2-3 weeks- mod I to supervision          -continue PT and OT- explained to pt, they are concerned about "early d/c"_ that if he hasn't made enough gains, can prolong stay.  2. Impaired mobility: -DVT/anticoagulation: Pharmaceutical: Continue Lovenox -antiplatelet therapy: N/a 3. Low back pain: continue air mattress. Resolved. Dicussed XR findings of degeneration.   4/25- was having back pain- and R knee pain- if doesn't improve, will schedule Am pain meds  4/28- no complaints of pain- con't regimen  5/1- pain controlled- doesn't want med changes- continue regimen 4. Mood: LCSW to follow for evaluation and support.  -antipsychotic agents: N/A 5. Neuropsych: This patient is capable of making decisions on his own behalf. 6. Multiple deep tissue injuries/Skin/Wound Care: Continue Air mattress overlay with pressure relief measures.  --Continue vitamins and collagen to promote wound healing  7. Fluids/Electrolytes/Nutrition: Monitor I/O. Check lytes in am. 8. SDH (concerns of bacterial infection): S/p Burr hole evacuation  9. Strep anginosis bacteremia: Continue Ceftriaxone/Metronidazole X 6 weeks w/end date  10 Liver abscess s/p JP drain placement: Continue flushes TID and document output.   -IR is following.  4/28- getting CT scan on abd/pelvis- with contrast- this AM- pending results-   4/29- looks a little better- con't IV ABX-  11. New onset seizures: Continue Keppra bid till follow up with neuro on outpatient basis.  12. Left hip ORIF: WBAT. Will consult ortho to eval  given worsening pain. Added MS contin for better pain control/participation with therapy. Appears to have helped, continue.   4/26- will remove staples 13. T2DM: Hgb A1C- 10.8. Will monitor BS ac/hs. Home regimen Levemir 10u am/15u pm with SSI.  --continue Levemir 13u bid with SSI and titrate insulin as indicated.             -4/23 improved control  4/25- BGs 68-256 in last 24 hours- much more labile today- if still an issue tomorrow, will ask DM coordinators.   4/26- will consult DM coordinators  4/27- they suggested 70/30 16 units BID- so don't have to use novolog.   4/28- BGs- 95-226- but 226 was before got night time 70/30- so doing better overall- less labile  5/1- BGs 127-208- overall better control- increase Novolog to 17U 14. BPH/Urinary retention: Foley replaced--keep in place for decompression for few days then start bladder program next week?.  --will increase flomax to 0.8 mg/HS  4/27- will d/c foley- and cath if needed.  4/28- has needed in/out caths- adding lidocaine jelly prn for cathing- explained will take some time to kick in.  4/29- not voiding yet- don't want to put foley back in, because bladder won't start to work if has foley- will at least try for  A few more days.     15. Pancreatic cancer with recurrence: Continue Creon with meals. --follow up with Hem/onc and Radiation Onc after discharge.  -wife asking about plans for radiation/chemo: will contact oncology to ask them to discuss with wife.  4/28- to start radiation 11/30/20 -is set up. At 1:30pm 16. Low back pain/muscle spasms- will start Robaxin prn and lidoderm patches 1- 8pm to 8am.              4/16: denies pain: decrease robaxin to q12H prn.  17. Hypokalemia: scheduled supplement and repeat BMP Monday 18. Overweight BMI 25.52: provide counseling.  19. Elevated alk phos: increasing,  Oncology notified. Oncology has discussed plans with wife for post- CIR follow-up.  20.  Right sided pleural effusion: could be contributing to his chest pain. Consulted IR regarding drainage/pleural studies given active cancer. ADDENDUM: s/p successful thoracentesis  -pt denies any SOB  4/25- VRE grew from Pleural effusion- ID to see today- linezolid started.    4/26- seen by ID- will con't Linizolid  4/28- will check CMP/CBC/CRP/ESR qMonday.  4/29-  Pleural abscess has decreased in size per CT- con't ABX  21. Hypertension: continue to monitor BP TID  LOS: 16 days A FACE TO  FACE EVALUATION WAS PERFORMED  Izora Ribas 11/28/2020, 11:47 AM

## 2020-11-29 ENCOUNTER — Ambulatory Visit: Payer: Medicare Other | Admitting: Radiation Oncology

## 2020-11-29 LAB — COMPREHENSIVE METABOLIC PANEL
ALT: 12 U/L (ref 0–44)
AST: 22 U/L (ref 15–41)
Albumin: 2.2 g/dL — ABNORMAL LOW (ref 3.5–5.0)
Alkaline Phosphatase: 642 U/L — ABNORMAL HIGH (ref 38–126)
Anion gap: 8 (ref 5–15)
BUN: <5 mg/dL — ABNORMAL LOW (ref 8–23)
CO2: 29 mmol/L (ref 22–32)
Calcium: 8.5 mg/dL — ABNORMAL LOW (ref 8.9–10.3)
Chloride: 98 mmol/L (ref 98–111)
Creatinine, Ser: 0.44 mg/dL — ABNORMAL LOW (ref 0.61–1.24)
GFR, Estimated: >60 mL/min (ref >60)
Glucose, Bld: 136 mg/dL — ABNORMAL HIGH (ref 70–99)
Potassium: 4 mmol/L (ref 3.5–5.1)
Sodium: 135 mmol/L (ref 135–145)
Total Bilirubin: 0.3 mg/dL (ref 0.3–1.2)
Total Protein: 6.7 g/dL (ref 6.5–8.1)

## 2020-11-29 LAB — CBC WITH DIFFERENTIAL/PLATELET
Abs Immature Granulocytes: 0.02 10*3/uL (ref 0.00–0.07)
Basophils Absolute: 0.1 10*3/uL (ref 0.0–0.1)
Basophils Relative: 1 %
Eosinophils Absolute: 0.3 10*3/uL (ref 0.0–0.5)
Eosinophils Relative: 5 %
HCT: 32.7 % — ABNORMAL LOW (ref 39.0–52.0)
Hemoglobin: 10.5 g/dL — ABNORMAL LOW (ref 13.0–17.0)
Immature Granulocytes: 0 %
Lymphocytes Relative: 16 %
Lymphs Abs: 1.1 10*3/uL (ref 0.7–4.0)
MCH: 28.9 pg (ref 26.0–34.0)
MCHC: 32.1 g/dL (ref 30.0–36.0)
MCV: 90.1 fL (ref 80.0–100.0)
Monocytes Absolute: 0.6 10*3/uL (ref 0.1–1.0)
Monocytes Relative: 9 %
Neutro Abs: 4.6 10*3/uL (ref 1.7–7.7)
Neutrophils Relative %: 69 %
Platelets: 675 10*3/uL — ABNORMAL HIGH (ref 150–400)
RBC: 3.63 MIL/uL — ABNORMAL LOW (ref 4.22–5.81)
RDW: 16.5 % — ABNORMAL HIGH (ref 11.5–15.5)
WBC: 6.7 10*3/uL (ref 4.0–10.5)
nRBC: 0 % (ref 0.0–0.2)

## 2020-11-29 LAB — SEDIMENTATION RATE: Sed Rate: 80 mm/hr — ABNORMAL HIGH (ref 0–16)

## 2020-11-29 LAB — GLUCOSE, CAPILLARY
Glucose-Capillary: 119 mg/dL — ABNORMAL HIGH (ref 70–99)
Glucose-Capillary: 240 mg/dL — ABNORMAL HIGH (ref 70–99)
Glucose-Capillary: 163 mg/dL — ABNORMAL HIGH (ref 70–99)
Glucose-Capillary: 197 mg/dL — ABNORMAL HIGH (ref 70–99)

## 2020-11-29 LAB — C-REACTIVE PROTEIN: CRP: 1.3 mg/dL — ABNORMAL HIGH (ref <1.0)

## 2020-11-29 NOTE — Plan of Care (Signed)
  Problem: Consults Goal: RH BRAIN INJURY PATIENT EDUCATION Description: Description: See Patient Education module for eduction specifics Outcome: Progressing Goal: Skin Care Protocol Initiated - if Braden Score 18 or less Description: If consults are not indicated, leave blank or document N/A Outcome: Progressing   Problem: RH BOWEL ELIMINATION Goal: RH STG MANAGE BOWEL WITH ASSISTANCE Description: STG Manage Bowel with Supervision Assistance. Outcome: Progressing Goal: RH STG MANAGE BOWEL W/MEDICATION W/ASSISTANCE Description: STG Manage Bowel with Medication with Supervision Assistance. Outcome: Progressing   Problem: RH BLADDER ELIMINATION Goal: RH STG MANAGE BLADDER WITH ASSISTANCE Description: STG Manage Bladder With Supervision Assistance Outcome: Progressing Goal: RH STG MANAGE BLADDER WITH MEDICATION WITH ASSISTANCE Description: STG Manage Bladder With Medication With Supervision Assistance. Outcome: Progressing   Problem: RH SKIN INTEGRITY Goal: RH STG SKIN FREE OF INFECTION/BREAKDOWN Description: Remain free from additional breakdown with supervision assistance while on rehab.  Outcome: Progressing Goal: RH STG MAINTAIN SKIN INTEGRITY WITH ASSISTANCE Description: STG Maintain Skin Integrity With Supervision Assistance. Outcome: Progressing Goal: RH STG ABLE TO PERFORM INCISION/WOUND CARE W/ASSISTANCE Description: STG Able To Perform Incision/Wound Care With Supervision Assistance. Outcome: Progressing   Problem: RH SAFETY Goal: RH STG ADHERE TO SAFETY PRECAUTIONS W/ASSISTANCE/DEVICE Description: STG Adhere to Safety Precautions With Supervision Assistance/Device. Outcome: Progressing Goal: RH STG DECREASED RISK OF FALL WITH ASSISTANCE Description: STG Decreased Risk of Fall With Supervision Assistance. Outcome: Progressing   Problem: RH PAIN MANAGEMENT Goal: RH STG PAIN MANAGED AT OR BELOW PT'S PAIN GOAL Description: <4 on a 0-10 pain scale. Outcome:  Progressing   Problem: RH KNOWLEDGE DEFICIT BRAIN INJURY Goal: RH STG INCREASE KNOWLEDGE OF SELF CARE AFTER BRAIN INJURY Description: Patient will be able to demonstrate knowledge of medication management, pain management, skin/wound care, weight bearing precautions, diabetes management with educational materials and handouts provided by staff, at discharge independently. Outcome: Progressing   

## 2020-11-29 NOTE — Progress Notes (Signed)
Physical Therapy Session Note  Patient Details  Name: Colton Bush MRN: [id number] Date of Birth: November 18, 1955  Today's Date: 11/29/2020 PT Individual Time: 1000-1100 PT Individual Time Calculation (min): 60 min  Short Term Goals: Week 3:  PT Short Term Goal 1 (Week 3): Pt will complete least restrictive transfer with min A consistently PT Short Term Goal 2 (Week 3): Pt will ambulate x 50 ft with LRAD and assist x 1 PT Short Term Goal 3 (Week 3): Pt will initiate stair training  Skilled Therapeutic Interventions/Progress Updates:    Pt received seated in w/c in room with wife [name] present for family education session. Provided handouts to [name] for home measurement sheet and where to purchase bedrail online. Pt is min A for sit to stand transfers with RW throughout session. Stand pivot transfer w/c to/from bed with RW and CGA to min A. Sit to/from supine on flat bed with use of bedrail at Supervision level. Ambulation x 25 ft with RW and min A for balance with close w/c follow for safety. Pt exhibits improved safety awareness and decreased impulsivity during gait, no LOB this session. Ascend/descend 5 x 3" steps with 2 handrails and mod A for balance with cues for step-to gait pattern. Pt exhibits improved function this date with ability to initiate stairs. Car transfer with min A with use of RW, cues for safety and LE management, assist needed for RLE management. Pt and wife with better understanding of pt's current functional level and goal level for a safe d/c home. Pt left seated in TIS chair in room with wife present for next session with OT.   Therapy Documentation Precautions:  Restrictions Weight Bearing Restrictions: No RLE Weight Bearing: Weight bearing as tolerated    Therapy/Group: Individual Therapy   Excell Seltzer, PT, DPT, CSRS  11/29/2020, 3:57 PM

## 2020-11-29 NOTE — Progress Notes (Signed)
Patient ID: Colton Bush, male   DOB: 12-27-1955, 65 y.o.   MRN: [id number]  SW returned phone call to [facility] [phone]) who will run patient's insurance again.   SW met with pt and pt wife in room to provide updates on IV abx costs, and waiting on final updates from [facility]. Family would like to move forward with [facility] if unable to get lower rates.   SW returned phone call to [facility] who reported their rate remains the same at $723.53, however, they have the option to apply for financial assistance. SW to inform pt and wife on this option.   SW met with pt and pt husband in room to inform on option to apply for financial assistance with [facility]. Family decided to move forward with [facility].   SW spoke with [facility] [phone]) to discuss a possible contract with [facility] if possible. Reports will explore and follow-up.  *Unable to establish a contract at this time.   SW spoke with [facility] [phone]) to inform on will now need HHA. States will need to be with [facility] or [facility]. SW spoke with [facility] to discuss referral. SW waiting on follow-up.   SW spoke with [facility] to rescind referral and family will move forward with [facility] due to cost.   Loralee Pacas, MSW, [place] Office: [phone] Cell: [phone] Fax: [phone]

## 2020-11-29 NOTE — Progress Notes (Signed)
PROGRESS NOTE   Subjective:  Pt reports doesn't want to "come back" after we d/c him- would rather stay longer.  Ready to have BM "right now".  JP drains out! stures out of Crani site.  Knee pain better, but still sore.  Radiation starts tomorrow.    ROS:   Pt denies SOB, abd pain, CP, N/V/C/D, and vision changes   Objective:   No results found. Recent Labs    11/29/20 0512  WBC 6.7  HGB 10.5*  HCT 32.7*  PLT 675*   Recent Labs    11/29/20 0512  NA 135  K 4.0  CL 98  CO2 29  GLUCOSE 136*  BUN <5*  CREATININE 0.44*  CALCIUM 8.5*    Intake/Output Summary (Last 24 hours) at 11/29/2020 0829 Last data filed at 11/29/2020 2233 Gross per 24 hour  Intake 572.2 ml  Output 2275 ml  Net -1702.8 ml     Pressure Injury 11/07/20 Heel Right Stage 1 -  Intact skin with non-blanchable redness of a localized area usually over a bony prominence. (Active)  11/07/20 0800  Location: Heel  Location Orientation: Right  Staging: Stage 1 -  Intact skin with non-blanchable redness of a localized area usually over a bony prominence.  Wound Description (Comments):   Present on Admission:      Pressure Injury 11/07/20 Sacrum Stage 2 -  Partial thickness loss of dermis presenting as a shallow open injury with a red, pink wound bed without slough. 4/15 skin is now broken; was documented as stage 1. now a stage 2. (Active)  11/07/20 2000  Location: Sacrum  Location Orientation:   Staging: Stage 2 -  Partial thickness loss of dermis presenting as a shallow open injury with a red, pink wound bed without slough.  Wound Description (Comments): 4/15 skin is now broken; was documented as stage 1. now a stage 2.  Present on Admission:      Pressure Injury 11/07/20 Heel Right Deep Tissue Pressure Injury - Purple or maroon localized area of discolored intact skin or blood-filled blister due to damage of underlying soft tissue from pressure  and/or shear. (Active)  11/07/20 2000  Location: Heel  Location Orientation: Right  Staging: Deep Tissue Pressure Injury - Purple or maroon localized area of discolored intact skin or blood-filled blister due to damage of underlying soft tissue from pressure and/or shear.  Wound Description (Comments):   Present on Admission:     Physical Exam: Vital Signs Blood pressure (!) 146/84, pulse 82, temperature 98.2 F (36.8 C), temperature source Oral, resp. rate 20, height 5' 11"  (1.803 m), weight 78 kg, SpO2 97 %.  Gen: no distress, normal appearing    General: awake, alert, appropriate, repeating self somewhat, sitting up in bed;  NAD HENT: conjugate gaze; oropharynx moist CV: regular rate; no JVD Pulmonary: CTA B/L; no W/R/R- good air movement GI: soft, NT, ND, (+)BS; JP drains out- abd wound C/D/I Psychiatric: appropriate; more interactive;  Neurological: alert- keeps repeating self multiple times- less delayed in answering questions  Musculoskeletal:  Cervical back: Normal range of motionand neck supple.  Comments: RLE staples out.   UE's 5-/5 but equal B/L  LE's 4+/5 in HF, KE, DF and PF B/L  Skin: General: Skin is warmand dry.  Comments: RLE incisions as well as abd incisions already noted- staples still in place Neurological:  Mental Status: He is oriented to person, place, and time.  Comments: Soft voice with mild left facial weakness. Slow to respond but able to follow basic motor commands.  Delayed but reasonable processing and speech today Intact to light touch in all 4 extremities     Assessment/Plan: 1. Functional deficits which require 3+ hours per day of interdisciplinary therapy in a comprehensive inpatient rehab setting.  Physiatrist is providing close team supervision and 24 hour management of active medical problems listed below.  Physiatrist and rehab team continue to assess barriers to discharge/monitor patient progress toward  functional and medical goals  Care Tool:  Bathing    Body parts bathed by patient: Right arm,Left arm,Chest,Abdomen,Face   Body parts bathed by helper: Front perineal area,Buttocks,Right upper leg,Left upper leg,Right lower leg,Left lower leg     Bathing assist Assist Level: Maximal Assistance - Patient 24 - 49%     Upper Body Dressing/Undressing Upper body dressing   What is the patient wearing?: Pull over shirt    Upper body assist Assist Level: Minimal Assistance - Patient > 75%    Lower Body Dressing/Undressing Lower body dressing      What is the patient wearing?: Pants,Underwear/pull up     Lower body assist Assist for lower body dressing: Minimal Assistance - Patient > 75%     Toileting Toileting    Toileting assist Assist for toileting: Dependent - Patient 0%     Transfers Chair/bed transfer  Transfers assist     Chair/bed transfer assist level: Minimal Assistance - Patient > 75%     Locomotion Ambulation   Ambulation assist   Ambulation activity did not occur: Safety/medical concerns  Assist level: 2 helpers Assistive device: Walker-rolling Max distance: 35'   Walk 10 feet activity   Assist  Walk 10 feet activity did not occur: Safety/medical concerns  Assist level: 2 helpers Assistive device: Walker-rolling   Walk 50 feet activity   Assist Walk 50 feet with 2 turns activity did not occur: Safety/medical concerns         Walk 150 feet activity   Assist Walk 150 feet activity did not occur: Safety/medical concerns         Walk 10 feet on uneven surface  activity   Assist Walk 10 feet on uneven surfaces activity did not occur: Safety/medical concerns         Wheelchair     Assist Will patient use wheelchair at discharge?: Yes Type of Wheelchair: Manual Wheelchair activity did not occur: Safety/medical concerns  Wheelchair assist level: Supervision/Verbal cueing Max wheelchair distance: 100'    Wheelchair  50 feet with 2 turns activity    Assist    Wheelchair 50 feet with 2 turns activity did not occur: Safety/medical concerns   Assist Level: Supervision/Verbal cueing   Wheelchair 150 feet activity     Assist  Wheelchair 150 feet activity did not occur: Safety/medical concerns       Blood pressure (!) 146/84, pulse 82, temperature 98.2 F (36.8 C), temperature source Oral, resp. rate 20, height 5' 11"  (1.803 m), weight 78 kg, SpO2 97 %.  Medical Problem List and Plan: 1.L frontal craniotomysecondary to LDH/SAH from fall -patient may Shower if incisions covered -ELOS/Goals: 2-3 weeks- mod I to supervision          -  continue PT and OT- explained to pt, they are concerned about "early d/c"_ that if he hasn't made enough gains, can prolong stay.   5/2- con't PT and OT- will d/w team about prolonging stay  2. Impaired mobility: -DVT/anticoagulation: Pharmaceutical: Continue Lovenox -antiplatelet therapy: N/a 3. Low back pain: continue air mattress. Resolved. Dicussed XR findings of degeneration.   4/25- was having back pain- and R knee pain- if doesn't improve, will schedule Am pain meds  5/2- pain doing OK- better in knees especially- con't regimen 4. Mood: LCSW to follow for evaluation and support.  -antipsychotic agents: N/A 5. Neuropsych: This patient is capable of making decisions on his own behalf. 6. Multiple deep tissue injuries/Skin/Wound Care: Continue Air mattress overlay with pressure relief measures.  --Continue vitamins and collagen to promote wound healing  7. Fluids/Electrolytes/Nutrition: Monitor I/O. Check lytes in am. 8. SDH (concerns of bacterial infection): S/p Burr hole evacuation  9. Strep anginosis bacteremia: Continue Ceftriaxone/Metronidazole X 6 weeks w/end date  10 Liver abscess s/p JP drain placement: Continue flushes TID and document output.   -IR is following.  4/28- getting CT  scan on abd/pelvis- with contrast- this AM- pending results-   4/29- looks a little better- con't IV ABX-   5/2- JP drain removed!- con't wound care daily and prn 11. New onset seizures: Continue Keppra bid till follow up with neuro on outpatient basis.  12. Left hip ORIF: WBAT. Will consult ortho to eval given worsening pain. Added MS contin for better pain control/participation with therapy. Appears to have helped, continue.   4/26- will remove staples 13. T2DM: Hgb A1C- 10.8. Will monitor BS ac/hs. Home regimen Levemir 10u am/15u pm with SSI.  --continue Levemir 13u bid with SSI and titrate insulin as indicated.             -4/23 improved control  4/25- BGs 68-256 in last 24 hours- much more labile today- if still an issue tomorrow, will ask DM coordinators.   5/2- BGs 111-124- 1 incident of 244- but otherwise well controlled- con't regimen 14. BPH/Urinary retention: Foley replaced--keep in place for decompression for few days then start bladder program next week?.  --will increase flomax to 0.8 mg/HS  4/27- will d/c foley- and cath if needed.  4/28- has needed in/out caths- adding lidocaine jelly prn for cathing- explained will take some time to kick in.  4/29- not voiding yet- don't want to put foley back in, because bladder won't start to work if has foley- will at least try for  A few more days.     15. Pancreatic cancer with recurrence: Continue Creon with meals. --follow up with Hem/onc and Radiation Onc after discharge.  -wife asking about plans for radiation/chemo: will contact oncology to ask them to discuss with wife.  4/28- to start radiation 11/30/20 -is set up. At 1:30pm  5/2- asked SW to make sure therapy doesn't schedule during radiation- or afterwards 16. Low back pain/muscle spasms- will start Robaxin prn and lidoderm patches 1- 8pm to 8am.              4/16: denies pain: decrease robaxin to q12H prn.  17. Hypokalemia: scheduled  supplement and repeat BMP Monday 18. Overweight BMI 25.52: provide counseling.  19. Elevated alk phos: increasing,  Oncology notified. Oncology has discussed plans with wife for post- CIR follow-up.  20. Right sided pleural effusion: could be contributing to his chest pain. Consulted IR regarding drainage/pleural studies given active cancer. ADDENDUM: s/p successful thoracentesis  -  pt denies any SOB  4/25- VRE grew from Pleural effusion- ID to see today- linezolid started.    4/26- seen by ID- will con't Linizolid  4/28- will check CMP/CBC/CRP/ESR qMonday.  4/29-  Pleural abscess has decreased in size per CT- con't ABX   5/2- WBC down to 6.7- con't regimen 21. Hypertension: continue to monitor BP TID  LOS: 17 days A FACE TO FACE EVALUATION WAS PERFORMED  Colton Bush 11/29/2020, 8:29 AM

## 2020-11-29 NOTE — Progress Notes (Signed)
Occupational Therapy Session Note  Patient Details  Name: LAURO MANLOVE MRN: [id number] Date of Birth: 04/12/1956  Today's Date: 11/29/2020 OT Individual Time: 1100-1155 OT Individual Time Calculation (min): 55 min    Short Term Goals: Week 3:  OT Short Term Goal 1 (Week 3): LTG=STG 2/2 ELOS  Skilled Therapeutic Interventions/Progress Updates:    Pt resting in w/c upon arrival with wife present for education. Reviewed DME recommendations. Pt hesitant to use seat or tub bench for bathing. Wife in agreement. Information provided. Discussed/demonstrated covering IV site during shower. Seal wrap provided. Discussed use of RW at home. Pt hesitant but verbalized understanding. Pt's wife encouraged pt to follow recommendations. Pt's wife pleased with progress. Pt remained in w/c with belt alarm activated. All needs within reach and wife present.   Therapy Documentation Precautions:  Restrictions Weight Bearing Restrictions: No RLE Weight Bearing: Weight bearing as tolerated General:   Vital Signs:  Pain: Pain Assessment Pain Scale: 0-10 Pain Score: 0-No pain ADL: ADL Grooming: Minimal assistance Where Assessed-Grooming: Edge of bed Upper Body Bathing: Minimal assistance Where Assessed-Upper Body Bathing:  (bsc) Lower Body Bathing: Maximal assistance (bsc) Upper Body Dressing: Moderate assistance Where Assessed-Upper Body Dressing:  (bsc) Lower Body Dressing: Dependent Where Assessed-Lower Body Dressing:  (BSC iwth stedy) Toileting: Dependent (stedy) Toilet Transfer: Dependent (stedy- MOD-MAX STS) Vision   Perception    Praxis   Exercises:   Other Treatments:     Therapy/Group: Individual Therapy  [name] 11/29/2020, 12:08 PM

## 2020-11-29 NOTE — Progress Notes (Signed)
Physical Therapy Session Note  Patient Details  Name: Codylee S Weatherall MRN: [id number] Date of Birth: 05/10/1956  Today's Date: 11/29/2020 PT Individual Time: 1345-1430 PT Individual Time Calculation (min): 45 min   Short Term Goals: Week 1:  PT Short Term Goal 1 (Week 1): pt to demonstrate supine<>sit min A PT Short Term Goal 1 - Progress (Week 1): Progressing toward goal PT Short Term Goal 2 (Week 1): pt to demonstrate bed<>chair transfers mod A with LRAD PT Short Term Goal 2 - Progress (Week 1): Progressing toward goal PT Short Term Goal 3 (Week 1): pt to demonstrate WC mobility 75 min A PT Short Term Goal 3 - Progress (Week 1): Met PT Short Term Goal 4 (Week 1): to initiate gait PT Short Term Goal 4 - Progress (Week 1): Not met Week 2:  PT Short Term Goal 1 (Week 2): Pt will perform least restrictive transfer with mod A consistently PT Short Term Goal 1 - Progress (Week 2): Met PT Short Term Goal 2 (Week 2): Pt will initiate gait training PT Short Term Goal 2 - Progress (Week 2): Met PT Short Term Goal 3 (Week 2): Pt will perform bed mobility with min A consistently PT Short Term Goal 3 - Progress (Week 2): Met Week 3:  PT Short Term Goal 1 (Week 3): Pt will complete least restrictive transfer with min A consistently PT Short Term Goal 2 (Week 3): Pt will ambulate x 50 ft with LRAD and assist x 1 PT Short Term Goal 3 (Week 3): Pt will initiate stair training  Skilled Therapeutic Interventions/Progress Updates:    Pain:  Pt reports 2/10 R knee pain.  Treatment to tolerance.  Rest breaks and repositioning as needed.  Pt initially oob in wc and agreeable to treatment session w/focus on global strengthening. Pt transported to gym for continued session.  Sit to stand from wc w/min assist and cues for safe handplacment. Gait 15ft w/cga, c/o dizzyness, turn/sit to mat w/cga and cues for safety.  Repeated Sit to stand x 6 reps w/cues for safety, cga to min assist.    Standing crossbody  transfer of horseshoes from knee height stool on one side to opposite side x 8 reps.  wc propulsion using bilat LEs 50ft x 1, 40ft x 1 then retropulsion x 40ft for bilat LE strengthening.  stand pivot transfer wc to bed w/RW and cga.  Sit to supine w/cga.  Pt left supine w/rails up x 3, alarm set, bed in lowest position, and needs in reach.  Therapy Documentation Precautions:  Restrictions Weight Bearing Restrictions: No RLE Weight Bearing: Weight bearing as tolerated   Therapy/Group: Individual Therapy   , PT    M  11/29/2020, 3:21 PM  

## 2020-11-29 NOTE — Progress Notes (Signed)
Occupational Therapy Session Note  Patient Details  Name: Colton Bush MRN: [id number] Date of Birth: 1955/09/18  Today's Date: 11/29/2020 OT Individual Time: 0930-1000 OT Individual Time Calculation (min): 30 min    Short Term Goals: Week 1:  OT Short Term Goal 1 (Week 1): P twill transfer with +1 A and LRAD ot BSC OT Short Term Goal 1 - Progress (Week 1): Progressing toward goal OT Short Term Goal 2 (Week 1): Pt will sit to stand consistently with MOD A in prep for clothing managment OT Short Term Goal 2 - Progress (Week 1): Progressing toward goal OT Short Term Goal 3 (Week 1): Pt will don shirt iwht MIN A OT Short Term Goal 3 - Progress (Week 1): Met OT Short Term Goal 4 (Week 1): Pt will groom seated EOB with S OT Short Term Goal 4 - Progress (Week 1): Progressing toward goal  Skilled Therapeutic Interventions/Progress Updates:    Treatment session with focus on functional transfers, self-care retraining, and energy conservation.  Pt received semi-reclined in bed agreeable to therapy session.  Pt completed bed mobility with supervision to come to sitting EOB.  Pt donned L shoe with setup and then required assistance to don R shoe as he is unable to obtain figure 4 position with RLE.  Pt completed sit > stand and stand pivot transfer min assist with RW to w/c.  Pt requested to complete grooming tasks in sitting due to fatigue.  Pt's wife arrived during grooming tasks.  Educated pt and wife on energy conservation strategies and pacing self during tasks and throughout day to increase success and independence with tasks.  Pt completed UB bathing and dressing with setup. Discussed d/c plan and follow up recommendations with pt and wife. Pt remained upright in w/c with RN arriving to administer morning meds.    Therapy Documentation Precautions:  Restrictions Weight Bearing Restrictions: No RLE Weight Bearing: Weight bearing as tolerated Pain: Pain Assessment Pain Scale: 0-10 Pain  Score: 0-No pain   Therapy/Group: Individual Therapy  Simonne Come 11/29/2020, 11:37 AM

## 2020-11-30 ENCOUNTER — Telehealth: Payer: Self-pay | Admitting: Radiation Oncology

## 2020-11-30 ENCOUNTER — Ambulatory Visit: Payer: Medicare Other | Admitting: Radiation Oncology

## 2020-11-30 LAB — GLUCOSE, CAPILLARY
Glucose-Capillary: 139 mg/dL — ABNORMAL HIGH (ref 70–99)
Glucose-Capillary: 171 mg/dL — ABNORMAL HIGH (ref 70–99)
Glucose-Capillary: 140 mg/dL — ABNORMAL HIGH (ref 70–99)
Glucose-Capillary: 64 mg/dL — ABNORMAL LOW (ref 70–99)
Glucose-Capillary: 97 mg/dL (ref 70–99)

## 2020-11-30 MED ORDER — INSULIN ASPART PROT & ASPART (70-30 MIX) 100 UNIT/ML ~~LOC~~ SUSP
18.0000 [IU] | Freq: Two times a day (BID) | SUBCUTANEOUS | Status: DC
  Administered 2020-11-30: 18 [IU] via SUBCUTANEOUS

## 2020-11-30 NOTE — Progress Notes (Signed)
Patient ID: Colton Bush, male   DOB: 04-14-1956, 65 y.o.   MRN: [id number]  Updates from [facility] accepted referral for HHPT/OT/SLP/SN (IV abx and wound care).   SW met with pt and pt wife to provide updates from team conference and change in d/c date to 5/11 to allow further gains to be made in rehab, [facility] being [facility], and [facility] to provide IV abx. Pt amenable to new d/c date.   SW updated [place] and [facility] due to change in d/c date.   Loralee Pacas, MSW, [place] Office: [phone] Cell: [phone] Fax: [phone]

## 2020-11-30 NOTE — Patient Care Conference (Signed)
Inpatient RehabilitationTeam Conference and Plan of Care Update Date: 11/30/2020   Time: 11:00 AM    Patient Name: Colton Bush      Medical Record Number: [id number]  Date of Birth: 07-15-1956 Sex: Male         Room/Bed: 4M12C/4M12C-01 Payor Info: Payor: MEDICARE / Plan: MEDICARE PART A AND B / Product Type: *No Product type* /    Admit Date/Time:  11/12/2020  5:52 PM  Primary Diagnosis:  Subdural hematoma [facility])  Hospital Problems: Principal Problem:   Subdural hematoma (HCC) Active Problems:   Liver abscess   Bladder outlet obstruction   Seizure in setting of subdural hematoma   Toxic encephalopathy   Pain    Expected Discharge Date: Expected Discharge Date: 12/08/20  Team Members Present: Physician leading conference: Dr. Courtney Heys Care Coodinator Present: Loralee Pacas, LCSWA;Kassadee Carawan Creig Hines, RN, BSN, CRRN Nurse Present: Dorthula Nettles, RN PT Present: Excell Seltzer, PT OT Present: Roanna Epley, [place], OT SLP Present: Charolett Bumpers, SLP PPS Coordinator present : Gunnar Fusi, SLP     Current Status/Progress Goal Weekly Team Focus  Bowel/Bladder   Pt continent of bowel LBM 11/29/2020. I&O cath every 6 hours.  Gain ability to urinate  Assess B/B every shift and PRN   Swallow/Nutrition/ Hydration             ADL's   functional transfers-min A; bathing-min A; LB dressing-mod A; toileting-mod A;  CGA/supervision overall  functional transfers, BADL training, safety awareness, activity tolerance, educaiton   Mobility   Supervision bed mobility, min A sit to stand and transfer with RW, gait up to 35 ft with RW assist x 2 for safety, mod A stairs (2 handrails)  min A overall, short distance gait  gait, balance, stairs, safety awareness   Communication             Safety/Cognition/ Behavioral Observations  Min  min-supervision A      Pain   Pt reports pain as 0/10  Pain level <3/10  Assess pain every shift and PRN   Skin   DTI to righ heel.  No  new breakdown  Assess skin every shift and PRN     Discharge Planning:  D/c to home with 24/7 care from his wife. Pt on IV abx until 5/24. Working on infusion company and [place].   Team Discussion: Intermittent nausea, denies constipation, increased insulin and changed to 70/30. Will either go home with a foley or wife will have to learn I&O cathing. WBC's are better. Continent/incontinent B/B, MASD to buttocks, stage 2 sacrum, right heel DTI, right heel stage 1 reported. Home health agency for IV antibiotics and infusion company needed. May also need outpatient palliative care. Patient on target to meet rehab goals: yes, min assist transfers with RW, with wheelchair follow for safety. Today became orthostatic during session, put thigh high ted hose on. Patient was symptomatic. Getting up to stand BP dropped. SLP reports patient is min assist for recall and safety.  *See Care Plan and progress notes for long and short-term goals.   Revisions to Treatment Plan:  Insulin changed to 70/30, educate wife on I&O cath.  Teaching Needs: Family education, medication management, insulin management, skin/wound care, dressing changes, transfer training, gait training, balance training, endurance training, stair training, safety awareness.  Current Barriers to Discharge: Decreased caregiver support, Medical stability, Home enviroment access/layout, IV antibiotics, Incontinence, Neurogenic bowel and bladder, Wound care, Lack of/limited family support, Medication compliance, Pending chemo/radiation and Behavior  Possible  Resolutions to Barriers: Continue current medications, provide emotional support.     Medical Summary Current Status: Continent/incontinent B/B- requires caths almost all the time- MASD -stage II sacrum on buttocks and DTI R heel vs stage I; on IV ABX til 5/24  Barriers to Discharge: Decreased family/caregiver support;Behavior;Home enviroment access/layout;Incontinence;Neurogenic Bowel &  Bladder;IV antibiotics;Pending chemo/radiation;Wound care;Medical stability  Barriers to Discharge Comments: got H/H agency for ABX- impulsive; mod A stairs; did family education for therapy- teach cath or place Foley. needs family 1 more time Possible Resolutions to [employer] Focus: focus- education on cathing; orthostasis today- 70s SBP- used thigh high TEDs- had a lot of Sx's- start Midodrine?;  extend to 5/11-   Continued Need for Acute Rehabilitation Level of Care: The patient requires daily medical management by a physician with specialized training in physical medicine and rehabilitation for the following reasons: Direction of a multidisciplinary physical rehabilitation program to maximize functional independence : Yes Medical management of patient stability for increased activity during participation in an intensive rehabilitation regime.: Yes Analysis of laboratory values and/or radiology reports with any subsequent need for medication adjustment and/or medical intervention. : Yes   I attest that I was present, lead the team conference, and concur with the assessment and plan of the team.   Cristi Loron 11/30/2020, 4:16 PM

## 2020-11-30 NOTE — Progress Notes (Signed)
Physical Therapy Session Note  Patient Details  Name: Colton Bush MRN: [id number] Date of Birth: [birth year]  Today's Date: 11/30/2020 PT Individual Time: 0900-0955 PT Individual Time Calculation (min): 55 min   Short Term Goals: Week 3:  PT Short Term Goal 1 (Week 3): Pt will complete least restrictive transfer with min A consistently PT Short Term Goal 2 (Week 3): Pt will ambulate x 50 ft with LRAD and assist x 1 PT Short Term Goal 3 (Week 3): Pt will initiate stair training  Skilled Therapeutic Interventions/Progress Updates:    Pt received seated in bed, agreeable to PT session. No complaints of pain at rest, has onset of R knee pain in standing. Nursing able to apply Voltarin gel to R knee at beginning of therapy session. Per OT pt orthostatic in standing this AM. Semi-reclined BP in bed 135/80. Semi-reclined to sitting EOB at Supervision level with use of bedrail. Seated BP 117/75. Sit to stand with min A to RW. Standing BP 89/61. Pt returned to supine at Supervision level with use of bedrail. Assisted pt with donning thigh-high TEDs for BP management. Pt perseverative on appearing like an "old man" due to wearing compression stockings. Education with patient that just trialing stockings to assist with BP management and that socks can be worn over stockings for decreased visibility. Pt returns to sitting. Seated BP 127/81 with THT. Standing BP 88/61. Standing marches x 10 reps with RW and min A for balance. Assessed BP following standing therex: 78/63 and pt reports onset of dizziness. Seated BP 139/80. Re-assessed standing BP to be 79/58. Pt left semi-reclined in TIS chair in room with needs in reach, quick release belt and chair alarm in place. Nursing notified of patient's orthostasis this session.  Therapy Documentation Precautions:  Restrictions Weight Bearing Restrictions: No RLE Weight Bearing: Weight bearing as tolerated   Therapy/Group: Individual Therapy   Excell Seltzer,  PT, DPT, CSRS  11/30/2020, 11:10 AM

## 2020-11-30 NOTE — Progress Notes (Signed)
PROGRESS NOTE   Subjective:  They moved radiation until next week- however trying to see if can extend pt, base don progress with therapy-  Having intermittent nausea, unsettled stomach- ate 50% of breakfast because didn't "want to push it".  LBM last night- denies constipation.   Concerned will be on IV ABX "forever".    ROS:   Pt denies SOB, abd pain, CP, N/V/C/D, and vision changes   Objective:   No results found. Recent Labs    11/29/20 0512  WBC 6.7  HGB 10.5*  HCT 32.7*  PLT 675*   Recent Labs    11/29/20 0512  NA 135  K 4.0  CL 98  CO2 29  GLUCOSE 136*  BUN <5*  CREATININE 0.44*  CALCIUM 8.5*    Intake/Output Summary (Last 24 hours) at 11/30/2020 0839 Last data filed at 11/30/2020 0768 Gross per 24 hour  Intake 837 ml  Output 1400 ml  Net -563 ml     Pressure Injury 11/07/20 Heel Right Stage 1 -  Intact skin with non-blanchable redness of a localized area usually over a bony prominence. (Active)  11/07/20 0800  Location: Heel  Location Orientation: Right  Staging: Stage 1 -  Intact skin with non-blanchable redness of a localized area usually over a bony prominence.  Wound Description (Comments):   Present on Admission:      Pressure Injury 11/07/20 Sacrum Stage 2 -  Partial thickness loss of dermis presenting as a shallow open injury with a red, pink wound bed without slough. 4/15 skin is now broken; was documented as stage 1. now a stage 2. (Active)  11/07/20 2000  Location: Sacrum  Location Orientation:   Staging: Stage 2 -  Partial thickness loss of dermis presenting as a shallow open injury with a red, pink wound bed without slough.  Wound Description (Comments): 4/15 skin is now broken; was documented as stage 1. now a stage 2.  Present on Admission:      Pressure Injury 11/07/20 Heel Right Deep Tissue Pressure Injury - Purple or maroon localized area of discolored intact skin or  blood-filled blister due to damage of underlying soft tissue from pressure and/or shear. (Active)  11/07/20 2000  Location: Heel  Location Orientation: Right  Staging: Deep Tissue Pressure Injury - Purple or maroon localized area of discolored intact skin or blood-filled blister due to damage of underlying soft tissue from pressure and/or shear.  Wound Description (Comments):   Present on Admission:     Physical Exam: Vital Signs Blood pressure 125/77, pulse 88, temperature (!) 97.5 F (36.4 C), resp. rate 18, height 5' 11"  (1.803 m), weight 78 kg, SpO2 93 %.  Gen: no distress, normal appearing     General: awake, alert, appropriate, sitting up in bed;  NAD HENT: conjugate gaze; oropharynx moist; gaunt face CV: regular rate; no JVD Pulmonary: CTA B/L; no W/R/R- good air movement GI: soft, NT, ND, (+)BS Psychiatric: appropriate but tangential frequently.  Neurological:alert but delayed response  Musculoskeletal:  Cervical back: Normal range of motionand neck supple.  Comments: RLE staples out.   UE's 5-/5 but equal B/L  LE's 4+/5 in HF, KE, DF and PF  B/L  Skin: General: Skin is warmand dry.  Comments: RLE incisions as well as abd incisions already noted- staples still in place Intact to light touch in all 4 extremities     Assessment/Plan: 1. Functional deficits which require 3+ hours per day of interdisciplinary therapy in a comprehensive inpatient rehab setting.  Physiatrist is providing close team supervision and 24 hour management of active medical problems listed below.  Physiatrist and rehab team continue to assess barriers to discharge/monitor patient progress toward functional and medical goals  Care Tool:  Bathing    Body parts bathed by patient: Right arm,Left arm,Chest,Abdomen,Face   Body parts bathed by helper: Front perineal area,Buttocks,Right upper leg,Left upper leg,Right lower leg,Left lower leg     Bathing assist Assist Level:  Maximal Assistance - Patient 24 - 49%     Upper Body Dressing/Undressing Upper body dressing   What is the patient wearing?: Pull over shirt    Upper body assist Assist Level: Minimal Assistance - Patient > 75%    Lower Body Dressing/Undressing Lower body dressing      What is the patient wearing?: Pants,Underwear/pull up     Lower body assist Assist for lower body dressing: Minimal Assistance - Patient > 75%     Toileting Toileting    Toileting assist Assist for toileting: Dependent - Patient 0%     Transfers Chair/bed transfer  Transfers assist     Chair/bed transfer assist level: Minimal Assistance - Patient > 75%     Locomotion Ambulation   Ambulation assist   Ambulation activity did not occur: Safety/medical concerns  Assist level: 2 helpers Assistive device: Walker-rolling Max distance: 25'   Walk 10 feet activity   Assist  Walk 10 feet activity did not occur: Safety/medical concerns  Assist level: 2 helpers Assistive device: Walker-rolling   Walk 50 feet activity   Assist Walk 50 feet with 2 turns activity did not occur: Safety/medical concerns         Walk 150 feet activity   Assist Walk 150 feet activity did not occur: Safety/medical concerns         Walk 10 feet on uneven surface  activity   Assist Walk 10 feet on uneven surfaces activity did not occur: Safety/medical concerns         Wheelchair     Assist Will patient use wheelchair at discharge?: Yes Type of Wheelchair: Manual Wheelchair activity did not occur: Safety/medical concerns  Wheelchair assist level: Supervision/Verbal cueing Max wheelchair distance: 100'    Wheelchair 50 feet with 2 turns activity    Assist    Wheelchair 50 feet with 2 turns activity did not occur: Safety/medical concerns   Assist Level: Supervision/Verbal cueing   Wheelchair 150 feet activity     Assist  Wheelchair 150 feet activity did not occur: Safety/medical  concerns       Blood pressure 125/77, pulse 88, temperature (!) 97.5 F (36.4 C), resp. rate 18, height 5' 11"  (1.803 m), weight 78 kg, SpO2 93 %.  Medical Problem List and Plan: 1.L frontal craniotomysecondary to LDH/SAH from fall -patient may Shower if incisions covered -ELOS/Goals: 2-3 weeks- mod I to supervision          -continue PT and OT- explained to pt, they are concerned about "early d/c"_ that if he hasn't made enough gains, can prolong stay.   5/3- will d/w team moving d/c to next week.   2. Impaired mobility: -DVT/anticoagulation: Pharmaceutical: Continue Lovenox -antiplatelet therapy: N/a 3. Low  back pain: continue air mattress. Resolved. Dicussed XR findings of degeneration.   4/25- was having back pain- and R knee pain- if doesn't improve, will schedule Am pain meds  5/2- pain doing OK- better in knees especially- con't regimen 4. Mood: LCSW to follow for evaluation and support.  -antipsychotic agents: N/A 5. Neuropsych: This patient is capable of making decisions on his own behalf. 6. Multiple deep tissue injuries/Skin/Wound Care: Continue Air mattress overlay with pressure relief measures.  --Continue vitamins and collagen to promote wound healing  7. Fluids/Electrolytes/Nutrition: Monitor I/O. Check lytes in am. 8. SDH (concerns of bacterial infection): S/p Burr hole evacuation  9. Strep anginosis bacteremia: Continue Ceftriaxone/Metronidazole X 6 weeks w/end date  10 Liver abscess s/p JP drain placement: Continue flushes TID and document output.   -IR is following.  4/28- getting CT scan on abd/pelvis- with contrast- this AM- pending results-   4/29- looks a little better- con't IV ABX-   5/2- JP drain removed!- con't wound care daily and prn 11. New onset seizures: Continue Keppra bid till follow up with neuro on outpatient basis.  12. Left hip ORIF: WBAT. Will consult ortho to eval given  worsening pain. Added MS contin for better pain control/participation with therapy. Appears to have helped, continue.   4/26- will remove staples 13. T2DM: Hgb A1C- 10.8. Will monitor BS ac/hs. Home regimen Levemir 10u am/15u pm with SSI.  --continue Levemir 13u bid with SSI and titrate insulin as indicated.             -4/23 improved control  4/25- BGs 68-256 in last 24 hours- much more labile today- if still an issue tomorrow, will ask DM coordinators.   5/3- BGs 119-240- will increase  To 18 units 70/30 insulin 14. BPH/Urinary retention: Foley replaced--keep in place for decompression for few days then start bladder program next week?.  --will increase flomax to 0.8 mg/HS  4/27- will d/c foley- and cath if needed.  4/28- has needed in/out caths- adding lidocaine jelly prn for cathing- explained will take some time to kick in.  4/29- not voiding yet- don't want to put foley back in, because bladder won't start to work if has foley- will at least try for  A few more days.   5/3- is still being cathed- has not been able to void- will check with family if want him to go home with foley vs in/out caths-     15. Pancreatic cancer with recurrence: Continue Creon with meals. --follow up with Hem/onc and Radiation Onc after discharge.  -wife asking about plans for radiation/chemo: will contact oncology to ask them to discuss with wife.  4/28- to start radiation 11/30/20 -is set up. At 1:30pm  5/2- asked SW to make sure therapy doesn't schedule during radiation- or afterwards  5/2- radiation got pushed to next week- will see if needs to do before d/c.  16. Low back pain/muscle spasms- will start Robaxin prn and lidoderm patches 1- 8pm to 8am.              4/16: denies pain: decrease robaxin to q12H prn.  17. Hypokalemia: scheduled supplement and repeat BMP Monday 18. Overweight BMI 25.52: provide counseling.  19. Elevated alk phos: increasing,  Oncology notified.  Oncology has discussed plans with wife for post- CIR follow-up.  20. Right sided pleural effusion: could be contributing to his chest pain. Consulted IR regarding drainage/pleural studies given active cancer. ADDENDUM: s/p successful thoracentesis  -pt denies any SOB  4/25- VRE grew from Pleural effusion- ID to see today- linezolid started.    4/26- seen by ID- will con't Linizolid  4/28- will check CMP/CBC/CRP/ESR qMonday.  4/29-  Pleural abscess has decreased in size per CT- con't ABX   5/2- WBC down to 6.7- con't regimen 21. Hypertension: continue to monitor BP TID  5/3- BP well controlled- con't regimen  LOS: 18 days A FACE TO FACE EVALUATION WAS PERFORMED  Colton Bush 11/30/2020, 8:39 AM

## 2020-11-30 NOTE — Progress Notes (Signed)
Occupational Therapy Session Note  Patient Details  Name: Colton Bush MRN: [id number] Date of Birth: 1956/01/17  Today's Date: 11/30/2020 OT Individual Time: 3545-6256 OT Individual Time Calculation (min): 53 min    Short Term Goals: Week 3:  OT Short Term Goal 1 (Week 3): LTG=STG 2/2 ELOS  Skilled Therapeutic Interventions/Progress Updates:    Pt received in TIS with wife present, agreeable to therapy. BP seated read at 116/66 (81). Standing, read at 116 / 64. Denies dizziness throughout sessions and Teds already donned. Session focus on activity tolerance, functional strength, and dynamic standing balance in prep for improved ADL/func mobility performance. Family has no concerns at this time re DC. W/c transport to and from gym. With 3 lb dowel rod, completed volley ball volley and 10 forward/backward circles. Req freq rest breaks throughout. C/o mild lower back pain, guided through various LB stretches with use of therapy ball and in supine with some improvement in pain. Stood to play 4 rounds of corn hole with LUE support, no LOB noted. Stand-pivot back to bed no AD + CGA + bed rail. Returned to supine and doffed B shoes close S.   Pt left semi-reclined in bed with SW and wife present, bed alarm engaged, call bell in reach, and all immediate needs met.    Therapy Documentation Precautions:  Restrictions Weight Bearing Restrictions: No RLE Weight Bearing: Weight bearing as tolerated Pain: see session note   ADL: See Care Tool for more details.   Therapy/Group: Individual Therapy  Volanda Napoleon MS, OTR/L  11/30/2020, 2:36 PM

## 2020-11-30 NOTE — Progress Notes (Signed)
Occupational Therapy Session Note  Patient Details  Name: Colton Bush MRN: [id number] Date of Birth: [birth year]  Today's Date: 11/30/2020 OT Individual Time: 0700-0755 OT Individual Time Calculation (min): 55 min    Short Term Goals: Week 3:  OT Short Term Goal 1 (Week 3): LTG=STG 2/2 ELOS  Skilled Therapeutic Interventions/Progress Updates:    Pt resting in bed upon arrival and agreeable to getting OOB. Supine>sit EOB with HOB elevated with supervison. Pt amb with RW to w/c at sink for UB bathing/dressing tasks. Pt declined LB bathing/dressing at this time. Tasks performed with supervision. Pt transitioned to gym to engage in standing tasks at [facility]. Upon standing pt c/o lightheadedness and dizzy. Pt also c/o seeing spots. Pt returned to room. BP seated in w/c 93/58 and 85/54. Pt returned to bed. BP 115/67. RN notified. Pt remained in bed with all needs within reach.   Therapy Documentation Precautions:  Restrictions Weight Bearing Restrictions: No RLE Weight Bearing: Weight bearing as tolerated  Pain:  Pt denies pain this morning   Therapy/Group: Individual Therapy  [name] 11/30/2020, 8:02 AM

## 2020-11-30 NOTE — Telephone Encounter (Signed)
I called the patient's wife to check in on how he's been doing with rehabilitation. I encouraged her to call me back.

## 2020-12-01 ENCOUNTER — Ambulatory Visit: Payer: Medicare Other | Admitting: Radiation Oncology

## 2020-12-01 ENCOUNTER — Telehealth: Payer: Self-pay

## 2020-12-01 ENCOUNTER — Telehealth: Payer: Self-pay | Admitting: *Deleted

## 2020-12-01 LAB — GLUCOSE, CAPILLARY
Glucose-Capillary: 98 mg/dL (ref 70–99)
Glucose-Capillary: 216 mg/dL — ABNORMAL HIGH (ref 70–99)
Glucose-Capillary: 229 mg/dL — ABNORMAL HIGH (ref 70–99)
Glucose-Capillary: 121 mg/dL — ABNORMAL HIGH (ref 70–99)

## 2020-12-01 MED ORDER — INSULIN ASPART PROT & ASPART (70-30 MIX) 100 UNIT/ML ~~LOC~~ SUSP
17.0000 [IU] | Freq: Two times a day (BID) | SUBCUTANEOUS | Status: DC
  Administered 2020-12-01 – 2020-12-07 (×12): 17 [IU] via SUBCUTANEOUS
  Filled 2020-12-01: qty 10

## 2020-12-01 MED ORDER — HYDROCORTISONE 1 % EX CREA
TOPICAL_CREAM | Freq: Two times a day (BID) | CUTANEOUS | Status: DC
  Filled 2020-12-01: qty 28

## 2020-12-01 MED ORDER — HYDROCERIN EX CREA
TOPICAL_CREAM | Freq: Two times a day (BID) | CUTANEOUS | Status: DC
  Filled 2020-12-01: qty 113

## 2020-12-01 MED ORDER — MORPHINE SULFATE ER 15 MG PO TBCR
30.0000 mg | EXTENDED_RELEASE_TABLET | Freq: Every day | ORAL | Status: DC
  Administered 2020-12-02 – 2020-12-08 (×7): 30 mg via ORAL
  Filled 2020-12-01 (×7): qty 2

## 2020-12-01 MED ORDER — MIDODRINE HCL 5 MG PO TABS: 5.0000 mg | ORAL_TABLET | Freq: Three times a day (TID) | ORAL | Status: DC | PRN

## 2020-12-01 NOTE — Progress Notes (Signed)
Occupational Therapy Session Note  Patient Details  Name: JERMIAH SODERMAN MRN: [id number] Date of Birth: 1955-08-31  Today's Date: 12/01/2020 OT Individual Time: 0700-0755 OT Individual Time Calculation (min): 55 min    Short Term Goals: Week 3:  OT Short Term Goal 1 (Week 3): LTG=STG 2/2 ELOS  Skilled Therapeutic Interventions/Progress Updates:    Pt resting in bed upon arrival. Pt requested use of bathroom. Supine>sit EOB with supervision. Sit<>stand and amb with RW to bathroom with CGA. Sit>stand from standard toilet with mod A. Pt required assistance for hygiene but managed clothing without assistance. Pt returned to w/c in room and completed bathing/dressing with sit<>stand from w/c at sink. See Care Tool for assist levels. Pt requires more then a reasonabel amount of time for ADLs. Pt remained in w/c with all needs within reach. Belt alarm activated.  Therapy Documentation Precautions:  Restrictions Weight Bearing Restrictions: No RLE Weight Bearing: Weight bearing as tolerated Pain: Pain Assessment Pain Scale: 0-10 Pain Score: 0-No pain   Therapy/Group: Individual Therapy  [name] 12/01/2020, 12:04 PM

## 2020-12-01 NOTE — Progress Notes (Signed)
Patient ID: Colton Bush, male   DOB: 05/02/1956, 65 y.o.   MRN: [id number]  SW faxed HHPT/OT/SN/aide/SLP order to [place] [phone].  Loralee Pacas, MSW, [place] Office: [phone] Cell: [phone] Fax: [phone]

## 2020-12-01 NOTE — Progress Notes (Signed)
PROGRESS NOTE   Subjective:  Pt reports having itching on back and L hamstring.  Also had dizziness/lightheaded last night.  Also had this AM- but not quite as bad of Sx's- but still notable.    ROS:   Pt denies SOB, abd pain, CP, N/V/C/D, and vision changes   Objective:   No results found. Recent Labs    11/29/20 0512  WBC 6.7  HGB 10.5*  HCT 32.7*  PLT 675*   Recent Labs    11/29/20 0512  NA 135  K 4.0  CL 98  CO2 29  GLUCOSE 136*  BUN <5*  CREATININE 0.44*  CALCIUM 8.5*    Intake/Output Summary (Last 24 hours) at 12/01/2020 5056 Last data filed at 12/01/2020 0206 Gross per 24 hour  Intake 360 ml  Output 950 ml  Net -590 ml     Pressure Injury 11/07/20 Heel Right Stage 1 -  Intact skin with non-blanchable redness of a localized area usually over a bony prominence. (Active)  11/07/20 0800  Location: Heel  Location Orientation: Right  Staging: Stage 1 -  Intact skin with non-blanchable redness of a localized area usually over a bony prominence.  Wound Description (Comments):   Present on Admission:      Pressure Injury 11/07/20 Heel Right Deep Tissue Pressure Injury - Purple or maroon localized area of discolored intact skin or blood-filled blister due to damage of underlying soft tissue from pressure and/or shear. (Active)  11/07/20 2000  Location: Heel  Location Orientation: Right  Staging: Deep Tissue Pressure Injury - Purple or maroon localized area of discolored intact skin or blood-filled blister due to damage of underlying soft tissue from pressure and/or shear.  Wound Description (Comments):   Present on Admission:     Physical Exam: Vital Signs Blood pressure 127/80, pulse 88, temperature 98.9 F (37.2 C), resp. rate 18, height _0  (1.803 m), weight 78 kg, SpO2 92 %.     General: awake, alert, appropriate, sitting up in w/c in room; pants off; OT in room; constantly scratching L leg  and low back, NAD HENT: conjugate gaze; oropharynx moist CV: regular rate; no JVD Pulmonary: CTA B/L; no W/R/R- good air movement GI: soft, NT, ND, (+)BS- hypoactive Psychiatric: more interactive; still tangential sometimes Neurological: Alert   Musculoskeletal:  Cervical back: Normal range of motionand neck supple.  Comments: RLE staples out.   UE's 5-/5 but equal B/L  LE's 4+/5 in HF, KE, DF and PF B/L  Skin: General: Skin is warmand dry. Low back B/L has excoriations from scratching and red/slightly excoriated L hamstring area.  Comments: RLE incisions as well as abd incisions already noted- staples out.  Intact to light touch in all 4 extremities     Assessment/Plan: 1. Functional deficits which require 3+ hours per day of interdisciplinary therapy in a comprehensive inpatient rehab setting.  Physiatrist is providing close team supervision and 24 hour management of active medical problems listed below.  Physiatrist and rehab team continue to assess barriers to discharge/monitor patient progress toward functional and medical goals  Care Tool:  Bathing    Body parts bathed by patient: Right arm,Left arm,Chest,Abdomen,Face   Body  parts bathed by helper: Front perineal area,Buttocks,Right upper leg,Left upper leg,Right lower leg,Left lower leg     Bathing assist Assist Level: Maximal Assistance - Patient 24 - 49%     Upper Body Dressing/Undressing Upper body dressing   What is the patient wearing?: Pull over shirt    Upper body assist Assist Level: Minimal Assistance - Patient > 75%    Lower Body Dressing/Undressing Lower body dressing      What is the patient wearing?: Pants,Underwear/pull up     Lower body assist Assist for lower body dressing: Minimal Assistance - Patient > 75%     Toileting Toileting    Toileting assist Assist for toileting: Dependent - Patient 0%     Transfers Chair/bed transfer  Transfers assist     Chair/bed  transfer assist level: Minimal Assistance - Patient > 75%     Locomotion Ambulation   Ambulation assist   Ambulation activity did not occur: Safety/medical concerns  Assist level: 2 helpers Assistive device: Walker-rolling Max distance: 25'   Walk 10 feet activity   Assist  Walk 10 feet activity did not occur: Safety/medical concerns  Assist level: 2 helpers Assistive device: Walker-rolling   Walk 50 feet activity   Assist Walk 50 feet with 2 turns activity did not occur: Safety/medical concerns         Walk 150 feet activity   Assist Walk 150 feet activity did not occur: Safety/medical concerns         Walk 10 feet on uneven surface  activity   Assist Walk 10 feet on uneven surfaces activity did not occur: Safety/medical concerns         Wheelchair     Assist Will patient use wheelchair at discharge?: Yes Type of Wheelchair: Manual Wheelchair activity did not occur: Safety/medical concerns  Wheelchair assist level: Supervision/Verbal cueing Max wheelchair distance: 100'    Wheelchair 50 feet with 2 turns activity    Assist    Wheelchair 50 feet with 2 turns activity did not occur: Safety/medical concerns   Assist Level: Supervision/Verbal cueing   Wheelchair 150 feet activity     Assist  Wheelchair 150 feet activity did not occur: Safety/medical concerns       Blood pressure 127/80, pulse 88, temperature 98.9 F (37.2 C), resp. rate 18, height _0  (1.803 m), weight 78 kg, SpO2 92 %.  Medical Problem List and Plan: 1.L frontal craniotomysecondary to LDH/SAH from fall -patient may Shower if incisions covered -ELOS/Goals: 2-3 weeks- mod I to supervision          -continue PT and OT- explained to pt, they are concerned about "early d/c"_ that if he hasn't made enough gains, can prolong stay.   55/4- con't PT and OT d/c moved to 5/11  2. Impaired mobility: -DVT/anticoagulation:  Pharmaceutical: Continue Lovenox -antiplatelet therapy: N/a 3. Low back pain: continue air mattress. Resolved. Dicussed XR findings of degeneration.   4/25- was having back pain- and R knee pain- if doesn't improve, will schedule Am pain meds  5/2- pain doing OK- better in knees especially- con't regimen 4. Mood: LCSW to follow for evaluation and support.  -antipsychotic agents: N/A 5. Neuropsych: This patient is capable of making decisions on his own behalf. 6. Multiple deep tissue injuries/Skin/Wound Care: Continue Air mattress overlay with pressure relief measures.  --Continue vitamins and collagen to promote wound healing  7. Fluids/Electrolytes/Nutrition: Monitor I/O. Check lytes in am. 8. SDH (concerns of bacterial infection): S/p Trudee Kuster hole evacuation  9. Strep anginosis bacteremia: Continue Ceftriaxone/Metronidazole X 6 weeks w/end date  10 Liver abscess s/p JP drain placement: Continue flushes TID and document output.   -IR is following.  4/28- getting CT scan on abd/pelvis- with contrast- this AM- pending results-   4/29- looks a little better- con't IV ABX-   5/2- JP drain removed!- con't wound care daily and prn 11. New onset seizures: Continue Keppra bid till follow up with neuro on outpatient basis.  12. Left hip ORIF: WBAT. Will consult ortho to eval given worsening pain. Added MS contin for better pain control/participation with therapy. Appears to have helped, continue.   4/26- will remove staples 13. T2DM: Hgb A1C- 10.8. Will monitor BS ac/hs. Home regimen Levemir 10u am/15u pm with SSI.  --continue Levemir 13u bid with SSI and titrate insulin as indicated.             -4/23 improved control  4/25- BGs 68-256 in last 24 hours- much more labile today- if still an issue tomorrow, will ask DM coordinators.   5/3- BGs 119-240- will increase  To 18 units 70/30 insulin  5/4- had low BG of 64 yesterday - will reduce insulin back  down to 17 units. And monitor 14. BPH/Urinary retention: Foley replaced--keep in place for decompression for few days then start bladder program next week?.  --will increase flomax to 0.8 mg/HS  4/27- will d/c foley- and cath if needed.  4/28- has needed in/out caths- adding lidocaine jelly prn for cathing- explained will take some time to kick in.  4/29- not voiding yet- don't want to put foley back in, because bladder won't start to work if has foley- will at least try for  A few more days.   5/3- is still being cathed- has not been able to void- will check with family if want him to go home with foley vs in/out caths-     15. Pancreatic cancer with recurrence: Continue Creon with meals. --follow up with Hem/onc and Radiation Onc after discharge.  -wife asking about plans for radiation/chemo: will contact oncology to ask them to discuss with wife.  4/28- to start radiation 11/30/20 -is set up. At 1:30pm  5/2- asked SW to make sure therapy doesn't schedule during radiation- or afterwards  5/2- radiation got pushed to next week- will see if needs to do before d/c.  16. Low back pain/muscle spasms- will start Robaxin prn and lidoderm patches 1- 8pm to 8am.              4/16: denies pain: decrease robaxin to q12H prn.  17. Hypokalemia: scheduled supplement and repeat BMP Monday 18. Overweight BMI 25.52: provide counseling.  19. Elevated alk phos: increasing,  Oncology notified. Oncology has discussed plans with wife for post- CIR follow-up.  20. Right sided pleural effusion: could be contributing to his chest pain. Consulted IR regarding drainage/pleural studies given active cancer. ADDENDUM: s/p successful thoracentesis  -pt denies any SOB  4/25- VRE grew from Pleural effusion- ID to see today- linezolid started.    4/26- seen by ID- will con't Linizolid  4/28- will check CMP/CBC/CRP/ESR qMonday.  4/29-  Pleural abscess has decreased in size per CT- con't ABX   5/2- WBC  down to 6.7- con't regimen 21. Hypertension with hypotension: continue to monitor BP TID  5/3- BP well controlled- con't regimen  5/4- had orthostatic hypotension yesterday- encouraged pt to drink (not drinking enough) and to take Midodrine 5 mg TID prn for low BP.  LOS: 19 days A FACE TO [place] 12/01/2020, 8:42 AM

## 2020-12-01 NOTE — Telephone Encounter (Signed)
Left a voicemail for the patient's wife [name] to let her know that we see that he will be in rehab just a little while longer.  We will push his radiation treatments out until he is discharged from rehab.  She should expect a phone call with a new date and time for his CT simulation.  Call back number [phone]) left.  Gloriajean Dell. Leonie Green, BSN

## 2020-12-01 NOTE — Progress Notes (Signed)
Physical Therapy Session Note  Patient Details  Name: Colton Bush MRN: [id number] Date of Birth: May 07, 1956  Today's Date: 12/01/2020 PT Individual Time: 1445-1529 PT Individual Time Calculation (min): 44 min   Short Term Goals: Week 3:  PT Short Term Goal 1 (Week 3): Pt will complete least restrictive transfer with min A consistently PT Short Term Goal 2 (Week 3): Pt will ambulate x 50 ft with LRAD and assist x 1 PT Short Term Goal 3 (Week 3): Pt will initiate stair training  Skilled Therapeutic Interventions/Progress Updates:    Patient received supine in bed, agreeable to PT. He denies pain, but endorses fatigue. He was able to don L gripper sock, but required assist for R due to hip pain. He was able to come sit edge of bed with supervision and transfer to wc via stand pivot with MinA. PT transporting patient in wc to therapy gym for time management and energy conservation. BP in sitting: 127/71 (87) 96BPM. PT providing prolonged hamstring stretch R LE. LAQ + marches in tolerable ROM. NuStep completed 3x6 mins. He was grossly limited by fatigue this therapy session. He returned to room in wc, transferring ot bed via stand pivot with MinA. PT doffing thigh high TED hose per patient request and replacing with gripper socks. Patient remaining in bed, 4 rails up, call light within reach.   Therapy Documentation Precautions:  Restrictions Weight Bearing Restrictions: No RLE Weight Bearing: Weight bearing as tolerated   Therapy/Group: Individual Therapy  Karoline Caldwell, PT, DPT, CBIS  12/01/2020, 7:50 AM

## 2020-12-01 NOTE — Telephone Encounter (Signed)
[name] called wanting to know when Colton Bush will be able to start treatment.

## 2020-12-01 NOTE — Progress Notes (Signed)
Physical Therapy Session Note  Patient Details  Name: Colton Bush MRN: [id number] Date of Birth: [birth year]  Today's Date: 12/01/2020 PT Individual Time: 0900-0956 PT Individual Time Calculation (min): 56 min   Short Term Goals: Week 3:  PT Short Term Goal 1 (Week 3): Pt will complete least restrictive transfer with min A consistently PT Short Term Goal 2 (Week 3): Pt will ambulate x 50 ft with LRAD and assist x 1 PT Short Term Goal 3 (Week 3): Pt will initiate stair training  Skilled Therapeutic Interventions/Progress Updates:    Pt greeted seated in TIS w/c, agreeable to therapy. Reports mild R knee pain and buttock pain from sitting in w/c. Rest breaks and repositioning provided for pain management. BP assessed in seated - 105/76 (HR 103). Sit<>stand with minA to RW, cues needed for hand placement. BP in standing 80/53 (HR 110) and pt reports only mild lightheadedness. Completed 20 reps of standing marching with minA for balance and RW support, BP Reassessed in standing reading 90/58 (HR 122).   W/c transport for time management to ortho gym. Stand<>pivot transfer with minA and RW from TIS w/c to Nustep . Completed 10 minutes at workload 4 while using both BUE/BLE for AAROM for R hip and promoting R hip/knee extension. Pt conversant throughout, denies shortness of breath or over exertion. Stand<>Pivot with minA and RW from Nustep to TIS w/c.   Focused remainder of session on functional gait training. With +2 assist for w/c follow for safety and line management (IV pole), pt ambulated 37ft + 67ft (seated rest breaks) with minA and RW. Demo's narrow BOS with step-to gait pattern, antalgic from R hip fx. Gait distances limited by RLE weakness > endurance or lightheadedness. Cues throughout gait cycle for widening BOS, step-through pattern, and general safety awareness.  Pt returned to room in TIS w/c and remained seated with safety belt alarm on and needs within reach.  Therapy  Documentation Precautions:  Restrictions Weight Bearing Restrictions: No RLE Weight Bearing: Weight bearing as tolerated General:    Therapy/Group: Individual Therapy  Gabbi Whetstone P Yailin Biederman PT 12/01/2020, 9:24 AM

## 2020-12-01 NOTE — Progress Notes (Signed)
Speech Language Pathology Daily Session Note  Patient Details  Name: Colton Bush MRN: [id number] Date of Birth: [birth year]  Today's Date: 12/01/2020 SLP Individual Time: 1103-1594 SLP Individual Time Calculation (min): 44 min  Short Term Goals: Week 3: SLP Short Term Goal 1 (Week 3): Pt will complete moderately complex problem solving tasks with supervision A verbal cues SLP Short Term Goal 2 (Week 3): Pt will increase short term recall of novel/functional information with Supervision A cues SLP Short Term Goal 3 (Week 3): Pt will increase judgement/safety for home management tasks and ADLs with supervision A cues to recall current physical impairments  Skilled Therapeutic Interventions:Skilled ST services focused on cognitive skills. SLP facilitated mildly complex problem solving and recall skills with TID pill organizer task. Pt initially required mod A verbal cues for recall within task and to comprehend task, however once SLP reduced conversational distraction and folded list to show current/perviously completed medication pt only required supervision A verbal cues. Initial errors appeared primarily impacted by reduce short term memory verse problem solving skills. Pt was left in room with call bell within reach and bed alarm set. SLP recommends to continue skilled services.     Pain Pain Assessment Pain Score: 0-No pain  Therapy/Group: Individual Therapy  Shawn Dannenberg  [facility] 12/01/2020, 2:59 PM

## 2020-12-02 ENCOUNTER — Ambulatory Visit: Payer: Medicare Other | Admitting: Radiation Oncology

## 2020-12-02 ENCOUNTER — Inpatient Hospital Stay (HOSPITAL_COMMUNITY): Payer: Medicare Other

## 2020-12-02 LAB — GLUCOSE, CAPILLARY
Glucose-Capillary: 135 mg/dL — ABNORMAL HIGH (ref 70–99)
Glucose-Capillary: 265 mg/dL — ABNORMAL HIGH (ref 70–99)
Glucose-Capillary: 168 mg/dL — ABNORMAL HIGH (ref 70–99)
Glucose-Capillary: 147 mg/dL — ABNORMAL HIGH (ref 70–99)

## 2020-12-02 MED ORDER — PANCRELIPASE (LIP-PROT-AMYL) 12000-38000 UNITS PO CPEP
24000.0000 [IU] | ORAL_CAPSULE | Freq: Three times a day (TID) | ORAL | Status: DC
  Administered 2020-12-02 – 2020-12-08 (×18): 24000 [IU] via ORAL
  Filled 2020-12-02 (×19): qty 2

## 2020-12-02 MED ORDER — CITALOPRAM HYDROBROMIDE 10 MG PO TABS
20.0000 mg | ORAL_TABLET | Freq: Every day | ORAL | Status: DC
  Administered 2020-12-02 – 2020-12-08 (×7): 20 mg via ORAL
  Filled 2020-12-02 (×7): qty 2

## 2020-12-02 MED ORDER — SIMETHICONE 80 MG PO CHEW
80.0000 mg | CHEWABLE_TABLET | Freq: Three times a day (TID) | ORAL | Status: DC
  Administered 2020-12-02 – 2020-12-08 (×24): 80 mg via ORAL
  Filled 2020-12-02 (×23): qty 1

## 2020-12-02 MED ORDER — CHLORHEXIDINE GLUCONATE CLOTH 2 % EX PADS
6.0000 | MEDICATED_PAD | Freq: Every day | CUTANEOUS | Status: DC
  Administered 2020-12-02 – 2020-12-08 (×7): 6 via TOPICAL

## 2020-12-02 MED ORDER — ONDANSETRON 4 MG PO TBDP
4.0000 mg | ORAL_TABLET | Freq: Four times a day (QID) | ORAL | Status: DC | PRN
  Administered 2020-12-04 – 2020-12-05 (×3): 4 mg via ORAL
  Filled 2020-12-02 (×3): qty 1

## 2020-12-02 NOTE — Progress Notes (Addendum)
PROGRESS NOTE   Subjective:  LBM this AM- had 3 loose BMs this AM- 1 bite of food- and had 3 stools- NOT LIQUID. Marland Kitchen No C Diff- not liquid Was cathing self in 2012- but very depressed- thinks doesn't have a reason to life.   Discussed at length about Foley vs in/out caths- pt said doesn't want to cath self 4+ times per day. Will do Foley- but will see when wants [place] to place it.     ROS:   Pt denies SOB, abd pain, CP, N/V/C/D, and vision changes   Objective:   No results found. No results for input(s): WBC, HGB, HCT, PLT in the last 72 hours. No results for input(s): NA, K, CL, CO2, GLUCOSE, BUN, CREATININE, CALCIUM in the last 72 hours.  Intake/Output Summary (Last 24 hours) at 12/02/2020 0909 Last data filed at 12/02/2020 5170 Gross per 24 hour  Intake 836 ml  Output 1850 ml  Net -1014 ml     Pressure Injury 11/07/20 Heel Right Stage 1 -  Intact skin with non-blanchable redness of a localized area usually over a bony prominence. (Active)  11/07/20 0800  Location: Heel  Location Orientation: Right  Staging: Stage 1 -  Intact skin with non-blanchable redness of a localized area usually over a bony prominence.  Wound Description (Comments):   Present on Admission:      Pressure Injury 11/07/20 Heel Right Deep Tissue Pressure Injury - Purple or maroon localized area of discolored intact skin or blood-filled blister due to damage of underlying soft tissue from pressure and/or shear. (Active)  11/07/20 2000  Location: Heel  Location Orientation: Right  Staging: Deep Tissue Pressure Injury - Purple or maroon localized area of discolored intact skin or blood-filled blister due to damage of underlying soft tissue from pressure and/or shear.  Wound Description (Comments):   Present on Admission:     Physical Exam: Vital Signs Blood pressure 130/77, pulse 84, temperature 98.6 F (37 C), temperature source Oral, resp. rate  17, height 5' 11" (1.803 m), weight 78 kg, SpO2 94 %.      General: awake, alert, appropriate,  But very depressed; on toilet; very upset about bladder; NAD HENT: conjugate gaze; oropharynx moist CV: regular rate; no JVD Pulmonary: CTA B/L; no W/R/R- good air movement GI: soft, NT, ND, (+)BS- hernia superficial noted on abdomen Psychiatric: severely depressed- said "doesn't have reason to live anymore" Neurological: alert   Musculoskeletal:  Cervical back: Normal range of motionand neck supple.  Comments: RLE staples out.   UE's 5-/5 but equal B/L  LE's 4+/5 in HF, KE, DF and PF B/L  Skin: General: Skin is warmand dry. Low back B/L has excoriations from scratching and red/slightly excoriated L hamstring area.  Comments: RLE incisions as well as abd incisions already noted- staples out.  Intact to light touch in all 4 extremities     Assessment/Plan: 1. Functional deficits which require 3+ hours per day of interdisciplinary therapy in a comprehensive inpatient rehab setting.  Physiatrist is providing close team supervision and 24 hour management of active medical problems listed below.  Physiatrist and rehab team continue to assess barriers to discharge/monitor patient progress  toward functional and medical goals  Care Tool:  Bathing    Body parts bathed by patient: Right arm,Left arm,Chest,Abdomen,Front perineal area,Right upper leg,Left upper leg,Left lower leg,Face   Body parts bathed by helper: Buttocks,Right lower leg     Bathing assist Assist Level: Minimal Assistance - Patient > 75%     Upper Body Dressing/Undressing Upper body dressing   What is the patient wearing?: Pull over shirt    Upper body assist Assist Level: Set up assist    Lower Body Dressing/Undressing Lower body dressing      What is the patient wearing?: Pants     Lower body assist Assist for lower body dressing: Minimal Assistance - Patient > 75%      Toileting Toileting    Toileting assist Assist for toileting: Moderate Assistance - Patient 50 - 74%     Transfers Chair/bed transfer  Transfers assist     Chair/bed transfer assist level: Minimal Assistance - Patient > 75%     Locomotion Ambulation   Ambulation assist   Ambulation activity did not occur: Safety/medical concerns  Assist level: 2 helpers Assistive device: Walker-rolling Max distance: 25'   Walk 10 feet activity   Assist  Walk 10 feet activity did not occur: Safety/medical concerns  Assist level: 2 helpers Assistive device: Walker-rolling   Walk 50 feet activity   Assist Walk 50 feet with 2 turns activity did not occur: Safety/medical concerns         Walk 150 feet activity   Assist Walk 150 feet activity did not occur: Safety/medical concerns         Walk 10 feet on uneven surface  activity   Assist Walk 10 feet on uneven surfaces activity did not occur: Safety/medical concerns         Wheelchair     Assist Will patient use wheelchair at discharge?: Yes Type of Wheelchair: Manual Wheelchair activity did not occur: Safety/medical concerns  Wheelchair assist level: Supervision/Verbal cueing Max wheelchair distance: 100'    Wheelchair 50 feet with 2 turns activity    Assist    Wheelchair 50 feet with 2 turns activity did not occur: Safety/medical concerns   Assist Level: Supervision/Verbal cueing   Wheelchair 150 feet activity     Assist  Wheelchair 150 feet activity did not occur: Safety/medical concerns       Blood pressure 130/77, pulse 84, temperature 98.6 F (37 C), temperature source Oral, resp. rate 17, height 5' 11" (1.803 m), weight 78 kg, SpO2 94 %.  Medical Problem List and Plan: 1.L frontal craniotomysecondary to LDH/SAH from fall -patient may Shower if incisions covered -ELOS/Goals: 2-3 weeks- mod I to supervision          -continue PT and OT- explained to  pt, they are concerned about "early d/c"_ that if he hasn't made enough gains, can prolong stay.   -5/5- con't PT and OT-  2. Impaired mobility: -DVT/anticoagulation: Pharmaceutical: Continue Lovenox -antiplatelet therapy: N/a 3. Low back pain: continue air mattress. Resolved. Dicussed XR findings of degeneration.   4/25- was having back pain- and R knee pain- if doesn't improve, will schedule Am pain meds  5/2- pain doing OK- better in knees especially- con't regimen 4. Mood: LCSW to follow for evaluation and support.  5/5- starting Celexa 20 mg daily- doesn't feel theres "any reason to leave".   -antipsychotic agents: N/A 5. Neuropsych: This patient is capable of making decisions on his own behalf. 6. Multiple deep tissue injuries/Skin/Wound Care: Continue  Air mattress overlay with pressure relief measures.  --Continue vitamins and collagen to promote wound healing  7. Fluids/Electrolytes/Nutrition: Monitor I/O. Check lytes in am. 8. SDH (concerns of bacterial infection): S/p Burr hole evacuation  9. Strep anginosis bacteremia: Continue Ceftriaxone/Metronidazole X 6 weeks w/end date  10 Liver abscess s/p JP drain placement: Continue flushes TID and document output.   -IR is following.  4/28- getting CT scan on abd/pelvis- with contrast- this AM- pending results-   4/29- looks a little better- con't IV ABX-   5/2- JP drain removed!- con't wound care daily and prn 11. New onset seizures: Continue Keppra bid till follow up with neuro on outpatient basis.  12. Left hip ORIF: WBAT. Will consult ortho to eval given worsening pain. Added MS contin for better pain control/participation with therapy. Appears to have helped, continue.   4/26- will remove staples 13. T2DM: Hgb A1C- 10.8. Will monitor BS ac/hs. Home regimen Levemir 10u am/15u pm with SSI.  --continue Levemir 13u bid with SSI and titrate insulin as indicated.             -4/23  improved control  4/25- BGs 68-256 in last 24 hours- much more labile today- if still an issue tomorrow, will ask DM coordinators.   5/3- BGs 119-240- will increase  To 18 units 70/30 insulin  5/4- had low BG of 64 yesterday - will reduce insulin back down to 17 units. And monitor  5/5- BGs 98 to 229- but don't want want to increas esince was low yesterday 14. BPH/Urinary retention: Foley replaced--keep in place for decompression for few days then start bladder program next week?.  --will increase flomax to 0.8 mg/HS  4/27- will d/c foley- and cath if needed.  4/28- has needed in/out caths- adding lidocaine jelly prn for cathing- explained will take some time to kick in.  4/29- not voiding yet- don't want to put foley back in, because bladder won't start to work if has foley- will at least try for  A few more days.   5/3- is still being cathed- has not been able to void- will check with family if want him to go home with foley vs in/out caths-     5/5- will place foley- have nurse let me know when to place. Will go home with it.   15. Pancreatic cancer with recurrence: Continue Creon with meals. --follow up with Hem/onc and Radiation Onc after discharge.  -wife asking about plans for radiation/chemo: will contact oncology to ask them to discuss with wife.  4/28- to start radiation 11/30/20 -is set up. At 1:30pm  5/2- asked SW to make sure therapy doesn't schedule during radiation- or afterwards  5/2- radiation got pushed to next week- will see if needs to do before d/c.  5/5- let them know/radiation that has d/c next week.   16. Low back pain/muscle spasms- will start Robaxin prn and lidoderm patches 1- 8pm to 8am.              4/16: denies pain: decrease robaxin to q12H prn.  17. Hypokalemia: scheduled supplement and repeat BMP Monday 18. Overweight BMI 25.52: provide counseling.  19. Elevated alk phos: increasing,  Oncology notified. Oncology has discussed plans with  wife for post- CIR follow-up.  20. Right sided pleural effusion: could be contributing to his chest pain. Consulted IR regarding drainage/pleural studies given active cancer. ADDENDUM: s/p successful thoracentesis  -pt denies any SOB  4/25- VRE grew from Pleural effusion- ID to see  today- linezolid started.    4/26- seen by ID- will con't Linizolid  4/28- will check CMP/CBC/CRP/ESR qMonday.  4/29-  Pleural abscess has decreased in size per CT- con't ABX   5/2- WBC down to 6.7- con't regimen 21. Hypertension with hypotension: continue to monitor BP TID  5/3- BP well controlled- con't regimen  5/4- had orthostatic hypotension yesterday- encouraged pt to drink (not drinking enough) and to take Midodrine 5 mg TID prn for low BP.   5/5- no hypotension so far today- con't regimen 22. Loose stools in setting of previous constipation  5/5- had 3 loose BMs this AM- if continues, might need to check C Diff vs imodium? Will monitor  I spent a total of 40 minutes on total care of pt- >50% on coordination of care- due to talking to pt and wife again this afternoon about Foley, N/V- and getting KUB, depression and starting Celexa, and orthostatic hypotension.    LOS: 20 days A FACE TO FACE EVALUATION WAS PERFORMED  Colton Bush 12/02/2020, 9:09 AM

## 2020-12-02 NOTE — Progress Notes (Signed)
Occupational Therapy Session Note  Patient Details  Name: Colton Bush MRN: [id number] Date of Birth: 1956-03-17  Today's Date: 12/02/2020 OT Individual Time: 0700-0800 OT Individual Time Calculation (min): 60 min    Short Term Goals: Week 4:  OT Short Term Goal 1 (Week 4): LTG=STG 2/2 ELOS  Skilled Therapeutic Interventions/Progress Updates:    Pt resting in bed upon arrival. Pt stated he had a bowel movement. Pt stated he knew he needed to have a bowel movement earlier but could not locate call bell. Call bell was placed out of reach of pt. OT intervention with focus on bed mobility, sit<>stand, standing balance, functional amb with RW to w/c, toileting, and activity tolerance. Pt dependent for toileting and LB dressing. Pt with large bowel movement at bed level. Pt stated he needed to have another bowel movement and bed pan placed. After cleaning pt, pt sat EOB and transferred to w/c. When standing at sink, pt stated he needed to have another bowel movement and also felt a "little dizzy." Pt transported into bathroom. Pt performed stand pivot transfer to toilet. Sit<>stand from toilet with mod A (lower surface). Pt commented that he "wasn't sure if this is all worth it." Pt also voiced concern to MD. Pt returned to bed, foam dressing replaced on sacrum by RN, and LB dressing completed with assistance by RN. Pt remained in bed with all needs within reach.   Therapy Documentation Precautions:  Restrictions Weight Bearing Restrictions: No RLE Weight Bearing: Weight bearing as tolerated   Pain: Pt c/o   increase R knee pain with sit<>stand; repositioned  Therapy/Group: Individual Therapy  [name] 12/02/2020, 11:16 AM

## 2020-12-02 NOTE — Progress Notes (Signed)
Occupational Therapy Weekly Progress Note  Patient Details  Name: Colton Bush MRN: [id number] Date of Birth: Jun 23, 1956  Beginning of progress report period: November 25, 2020 End of progress report period: Dec 02, 2020   Pt is making steady progress towards LTGs. Discharge date was extended to 5/11. Bed mobility with supervision using bed rails. Functional transfers with CGA. Bathing with mod A, LB dressing with min A. Toileting with mod A. Pt requires min verbal cues for safety awareness. Pt's wife has participated in education sessions and is scheduled for additional education before discharge.   Patient continues to demonstrate the following deficits: muscle weakness, decreased cardiorespiratoy endurance and decreased standing balance and decreased balance strategies and therefore will continue to benefit from skilled OT intervention to enhance overall performance with BADL and Reduce care partner burden.  Patient progressing toward long term goals..  Continue plan of care.  OT Short Term Goals Week 3:  OT Short Term Goal 1 (Week 3): LTG=STG 2/2 ELOS Week 4:  OT Short Term Goal 1 (Week 4): LTG=STG 2/2 ELOS   [name] 12/02/2020, 7:00 AM

## 2020-12-02 NOTE — Progress Notes (Signed)
Occupational Therapy Session Note  Patient Details  Name: VIRAT PRATHER MRN: [id number] Date of Birth: 1956/06/02  Today's Date: 12/02/2020 OT Individual Time: 1034-1100 OT Individual Time Calculation (min): 26 min    Short Term Goals: Week 3:  OT Short Term Goal 1 (Week 3): LTG=STG 2/2 ELOS  Skilled Therapeutic Interventions/Progress Updates:    Pt completed supine to sit EOB with min assist to begin session.  He then complete stand pivot transfer to the wheelchair at min assist level as well with use of the RW for support.  Min instructional cueing for hand placement when sitting down.  Grooming tasks were completed by request over at the sink including washing his face, brushing his teeth, and combing his hair all at supervision level.  BP taken in sitting as well at 129/78.  He completed one sit to stand with min assist using the RW but did not tolerate standing more than just about 10 seconds secondary to right knee pain.  Finished session with PT in for next visit.  Pt with down mood throughout session based on current disability and not having a good relationship with his son and other family members.  May benefit from neuropsych.  Therapy Documentation Precautions:  Precautions Precautions: Fall Restrictions Weight Bearing Restrictions: No RLE Weight Bearing: Weight bearing as tolerated   Pain: Pain Assessment Pain Scale: Faces Pain Score: 0-No pain Faces Pain Scale: No hurt ADL: See Care Tool Section for some details of mobility and selfcare  Therapy/Group: Individual Therapy  Kaiser Belluomini OTR/L 12/02/2020, 12:22 PM

## 2020-12-02 NOTE — Progress Notes (Signed)
Physical Therapy Session Note  Patient Details  Name: Colton Bush MRN: [id number] Date of Birth: 02-22-1956  Today's Date: 12/02/2020 PT Individual Time: 1100-1153 PT Individual Time Calculation (min): 53 min   Short Term Goals: Week 3:  PT Short Term Goal 1 (Week 3): Pt will complete least restrictive transfer with min A consistently PT Short Term Goal 2 (Week 3): Pt will ambulate x 50 ft with LRAD and assist x 1 PT Short Term Goal 3 (Week 3): Pt will initiate stair training  Skilled Therapeutic Interventions/Progress Updates:    Pt received seated in w/c in room handed off from OT session. Pt reports pain in R knee at rest, 6/10. Pt reports being premedicated for pain. Applied ACE wrap to R knee for pain management and improved stability in standing. Sit to stand with min A to RW. Standing BP 87/69 with onset of dizziness in standing, unable to stand long enough to fully obtain standing BP. Assisted pt with donning BLE thigh-high TEDs. Sit to stand with min A to RW. Again attempted to obtain standing BP, reading 78/61 although patient not able to tolerate standing long enough to fully obtain standing BP. Seated BP 123/77. Deferred further standing activity this session due to ongoing orthostasis. Pt also with frequent comments this session regarding not wanting to live much longer after returning home and discussing current tension and stress with his wife. Provided emotional support to patient and notified nursing of patient's comments, nursing aware. Took patient outdoors for improved mood. Seated 3# dowel rod volleyball 4 x 20 reps for global endurance. Pt agreeable to remain seated in TIS at end of session for lunch, needs in reach, quick release belt and chair alarm in place. Pt missed 7 min of scheduled therapy session due to fatigue.  Therapy Documentation Precautions:  Restrictions Weight Bearing Restrictions: No RLE Weight Bearing: Weight bearing as tolerated General: PT Amount of  Missed Time (min): 7 Minutes PT Missed Treatment Reason: Patient fatigue    Therapy/Group: Individual Therapy   Excell Seltzer, PT, DPT, CSRS  12/02/2020, 12:00 PM

## 2020-12-02 NOTE — Progress Notes (Signed)
Speech Language Pathology Daily Session Note  Patient Details  Name: PROSPERO MAHNKE MRN: [id number] Date of Birth: [birth year]  Today's Date: 12/02/2020 SLP Individual Time: 0830-0930 SLP Individual Time Calculation (min): 60 min  Short Term Goals: Week 3: SLP Short Term Goal 1 (Week 3): Pt will complete moderately complex problem solving tasks with supervision A verbal cues SLP Short Term Goal 2 (Week 3): Pt will increase short term recall of novel/functional information with Supervision A cues SLP Short Term Goal 3 (Week 3): Pt will increase judgement/safety for home management tasks and ADLs with supervision A cues to recall current physical impairments  Skilled Therapeutic Interventions: Skilled SLP intervention focused on cognition. Pt very tangential this session and expressed frustration with medical situation. SLP provided support and encouragement. Pt completed mildly complex money management task with supervision A. He required increased time but solutions were accurate with problem solving. He demonstrated good awareness of physical limitations during conversation with supervision to min A. Cont with therapy per plan of care.      Pain Pain Assessment Pain Scale: Faces Pain Score: 0-No pain Faces Pain Scale: No hurt  Therapy/Group: Individual Therapy  Darrol Poke Mikailah Morel 12/02/2020, 12:22 PM

## 2020-12-03 ENCOUNTER — Ambulatory Visit: Payer: Medicare Other | Admitting: Radiation Oncology

## 2020-12-03 LAB — GLUCOSE, CAPILLARY
Glucose-Capillary: 147 mg/dL — ABNORMAL HIGH (ref 70–99)
Glucose-Capillary: 203 mg/dL — ABNORMAL HIGH (ref 70–99)
Glucose-Capillary: 96 mg/dL (ref 70–99)
Glucose-Capillary: 182 mg/dL — ABNORMAL HIGH (ref 70–99)

## 2020-12-03 MED ORDER — MIDODRINE HCL 5 MG PO TABS
2.5000 mg | ORAL_TABLET | Freq: Three times a day (TID) | ORAL | Status: DC
  Administered 2020-12-03 – 2020-12-04 (×4): 2.5 mg via ORAL
  Filled 2020-12-03 (×4): qty 1

## 2020-12-03 NOTE — Progress Notes (Signed)
Speech Language Pathology Weekly Progress and Session Note  Patient Details  Name: Colton Bush MRN: [id number] Date of Birth: 09-13-1955  Beginning of progress report period: 11/27/2020 End of progress report period: 12/03/2020  Today's Date: 12/03/2020 SLP Individual Time: 0930-1015 SLP Individual Time Calculation (min): 45 min  Short Term Goals: Week 3: SLP Short Term Goal 1 (Week 3): Pt will complete moderately complex problem solving tasks with supervision A verbal cues SLP Short Term Goal 1 - Progress (Week 3): Progressing toward goal SLP Short Term Goal 2 (Week 3): Pt will increase short term recall of novel/functional information with Supervision A cues SLP Short Term Goal 2 - Progress (Week 3): Met SLP Short Term Goal 3 (Week 3): Pt will increase judgement/safety for home management tasks and ADLs with supervision A cues to recall current physical impairments SLP Short Term Goal 3 - Progress (Week 3): Met    New Short Term Goals: Week 4: SLP Short Term Goal 1 (Week 4): STG=LTG (anticipated discharge 5/11)  Weekly Progress Updates:  Patient made good progress meeting 2/3 STG's and making progress toward goal he did not meet. He continues to benefit from [place] for complex level problem solving and reasoning.    Intensity: Minumum of 1-2 x/day, 30 to 90 minutes Frequency: 3 to 5 out of 7 days Duration/Length of Stay: 5/6 or possible extension to 5/14 Treatment/Interventions: Cognitive remediation/compensation;Speech/Language facilitation;Patient/family education   Daily Session  Skilled Therapeutic Interventions: Patient seen for skilled ST session focusing on cognitive function goals. Patient demonstrated recall of recent events and specific tasks completed with ST. He voiced that he feels the tasks are "pity stuff" and that they are things he knows how to do. He spoke of not looking forward to his wife "mothering" him when he is discharged home and also that he  wasn't looking forward to being confined at home since he will not be able to drive for another 4-6 weeks. SLP directed discussion of interests, meaningful tasks as patient appears to be lacking motivation. He stated he used to want to get into woodworking and also talked about his cars. SLP suggest model car kits and he did say he had thought of that before. Patient said he is just looking forward to when he is done with treatments, etc.   General    Pain Pain Assessment Pain Scale: 0-10 Pain Score: 0-No pain  Therapy/Group: Individual Therapy  Sonia Baller, MA, CCC-SLP Speech Therapy

## 2020-12-03 NOTE — Progress Notes (Signed)
PROGRESS NOTE   Subjective:  Pt reports no vomiting since yesterday afternoon- nausea MUCH better- not quite resolved though.  Had a light breakfast.  Foley placed. Had BM on BSC/toilet this AM. Was a little low without BSC over it- that has been remedied.    ROS:   Pt denies SOB, abd pain, CP, N/V/C/D, and vision changes   Objective:   DG Abd 1 View  Result Date: 12/02/2020 CLINICAL DATA:  Nausea and vomiting EXAM: ABDOMEN - 1 VIEW COMPARISON:  11/25/2020 FINDINGS: Scattered large and small bowel gas is noted. No obstructive changes are seen. No free air is noted. Postsurgical changes are noted in the right hip and mid abdomen. Degenerative changes of lumbar spine are seen. Bone island is noted within the left iliac bone. IMPRESSION: Postsurgical changes without acute abnormality. Electronically Signed   By: Inez Catalina M.D.   On: 12/02/2020 15:55   No results for input(s): WBC, HGB, HCT, PLT in the last 72 hours. No results for input(s): NA, K, CL, CO2, GLUCOSE, BUN, CREATININE, CALCIUM in the last 72 hours.  Intake/Output Summary (Last 24 hours) at 12/03/2020 1002 Last data filed at 12/03/2020 0844 Gross per 24 hour  Intake 358 ml  Output 475 ml  Net -117 ml     Pressure Injury 11/07/20 Heel Right Stage 1 -  Intact skin with non-blanchable redness of a localized area usually over a bony prominence. (Active)  11/07/20 0800  Location: Heel  Location Orientation: Right  Staging: Stage 1 -  Intact skin with non-blanchable redness of a localized area usually over a bony prominence.  Wound Description (Comments):   Present on Admission:      Pressure Injury 11/07/20 Heel Right Deep Tissue Pressure Injury - Purple or maroon localized area of discolored intact skin or blood-filled blister due to damage of underlying soft tissue from pressure and/or shear. (Active)  11/07/20 2000  Location: Heel  Location Orientation: Right   Staging: Deep Tissue Pressure Injury - Purple or maroon localized area of discolored intact skin or blood-filled blister due to damage of underlying soft tissue from pressure and/or shear.  Wound Description (Comments):   Present on Admission:     Physical Exam: Vital Signs Blood pressure (!) 143/77, pulse 81, temperature 98.8 F (37.1 C), temperature source Oral, resp. rate 20, height 5' 11" (1.803 m), weight 78 kg, SpO2 95 %.       General: awake, alert, appropriate, sitting in w/c; OT in room; NAD HENT: conjugate gaze; oropharynx moist CV: regular rate; no JVD Pulmonary: CTA B/L; no W/R/R- good air movement GI: soft, NT, ND, (+)BS Psychiatric: depressed, but more interactive this AM Neurological: Ox3; somewhat HOH  Musculoskeletal:  Cervical back: Normal range of motionand neck supple.  Comments: RLE staples out.   UE's 5-/5 but equal B/L  LE's 4+/5 in HF, KE, DF and PF B/L  Skin: General: Skin is warmand dry. Low back B/L has excoriations from scratching and red/slightly excoriated L hamstring area.  Comments: RLE incisions as well as abd incisions already noted- staples out.  Intact to light touch in all 4 extremities     Assessment/Plan: 1. Functional deficits which  require 3+ hours per day of interdisciplinary therapy in a comprehensive inpatient rehab setting.  Physiatrist is providing close team supervision and 24 hour management of active medical problems listed below.  Physiatrist and rehab team continue to assess barriers to discharge/monitor patient progress toward functional and medical goals  Care Tool:  Bathing    Body parts bathed by patient: Right arm,Left arm,Chest,Abdomen,Front perineal area,Right upper leg,Left upper leg,Left lower leg,Face   Body parts bathed by helper: Buttocks,Right lower leg     Bathing assist Assist Level: Minimal Assistance - Patient > 75%     Upper Body Dressing/Undressing Upper body dressing   What  is the patient wearing?: Pull over shirt    Upper body assist Assist Level: Set up assist    Lower Body Dressing/Undressing Lower body dressing      What is the patient wearing?: Pants     Lower body assist Assist for lower body dressing: Minimal Assistance - Patient > 75%     Toileting Toileting    Toileting assist Assist for toileting: Maximal Assistance - Patient 25 - 49%     Transfers Chair/bed transfer  Transfers assist     Chair/bed transfer assist level: Minimal Assistance - Patient > 75%     Locomotion Ambulation   Ambulation assist   Ambulation activity did not occur: Safety/medical concerns  Assist level: 2 helpers Assistive device: Walker-rolling Max distance: 25'   Walk 10 feet activity   Assist  Walk 10 feet activity did not occur: Safety/medical concerns  Assist level: 2 helpers Assistive device: Walker-rolling   Walk 50 feet activity   Assist Walk 50 feet with 2 turns activity did not occur: Safety/medical concerns         Walk 150 feet activity   Assist Walk 150 feet activity did not occur: Safety/medical concerns         Walk 10 feet on uneven surface  activity   Assist Walk 10 feet on uneven surfaces activity did not occur: Safety/medical concerns         Wheelchair     Assist Will patient use wheelchair at discharge?: Yes Type of Wheelchair: Manual Wheelchair activity did not occur: Safety/medical concerns  Wheelchair assist level: Supervision/Verbal cueing Max wheelchair distance: 100'    Wheelchair 50 feet with 2 turns activity    Assist    Wheelchair 50 feet with 2 turns activity did not occur: Safety/medical concerns   Assist Level: Supervision/Verbal cueing   Wheelchair 150 feet activity     Assist  Wheelchair 150 feet activity did not occur: Safety/medical concerns       Blood pressure (!) 143/77, pulse 81, temperature 98.8 F (37.1 C), temperature source Oral, resp. rate 20,  height 5' 11" (1.803 m), weight 78 kg, SpO2 95 %.  Medical Problem List and Plan: 1.L frontal craniotomysecondary to LDH/SAH from fall -patient may Shower if incisions covered -ELOS/Goals: 2-3 weeks- mod I to supervision          -continue PT and OT- explained to pt, they are concerned about "early d/c"_ that if he hasn't made enough gains, can prolong stay.   -5/6- con't PT and OT- d/c next week  2. Impaired mobility: -DVT/anticoagulation: Pharmaceutical: Continue Lovenox -antiplatelet therapy: N/a 3. Low back pain: continue air mattress. Resolved. Dicussed XR findings of degeneration.   4/25- was having back pain- and R knee pain- if doesn't improve, will schedule Am pain meds  5/2- pain doing OK- better in knees especially- con't regimen  5/6- pain was worse in R knee this AM- buckled slightly when on toilet- con't pain regimen for now 4. Mood: LCSW to follow for evaluation and support.  5/5- starting Celexa 20 mg daily- doesn't feel theres "any reason to leave".    5/6- pt happy that "he's not the only one" with depression in this situation- OK with Celexa- might need Remeron if appetite doesn't pick up? -antipsychotic agents: N/A 5. Neuropsych: This patient is capable of making decisions on his own behalf. 6. Multiple deep tissue injuries/Skin/Wound Care: Continue Air mattress overlay with pressure relief measures.  --Continue vitamins and collagen to promote wound healing  7. Fluids/Electrolytes/Nutrition: Monitor I/O. Check lytes in am. 8. SDH (concerns of bacterial infection): S/p Burr hole evacuation  9. Strep anginosis bacteremia: Continue Ceftriaxone/Metronidazole X 6 weeks w/end date  10 Liver abscess s/p JP drain placement: Continue flushes TID and document output.   -IR is following.  4/28- getting CT scan on abd/pelvis- with contrast- this AM- pending results-   4/29- looks a little better- con't IV ABX-    5/2- JP drain removed!- con't wound care daily and prn 11. New onset seizures: Continue Keppra bid till follow up with neuro on outpatient basis.  12. Left hip ORIF: WBAT. Will consult ortho to eval given worsening pain. Added MS contin for better pain control/participation with therapy. Appears to have helped, continue.   4/26- will remove staples 13. T2DM: Hgb A1C- 10.8. Will monitor BS ac/hs. Home regimen Levemir 10u am/15u pm with SSI.  --continue Levemir 13u bid with SSI and titrate insulin as indicated.             -4/23 improved control  4/25- BGs 68-256 in last 24 hours- much more labile today- if still an issue tomorrow, will ask DM coordinators.   5/3- BGs 119-240- will increase  To 18 units 70/30 insulin  5/4- had low BG of 64 yesterday - will reduce insulin back down to 17 units. And monitor  5/5- BGs 98 to 229- but don't want want to increas esince was low yesterday  5/6- BGs better, but also hasn't eaten a lot in last 24 hours- monitor closely.  14. BPH/Urinary retention: Foley replaced--keep in place for decompression for few days then start bladder program next week?.  --will increase flomax to 0.8 mg/HS  4/27- will d/c foley- and cath if needed.  4/28- has needed in/out caths- adding lidocaine jelly prn for cathing- explained will take some time to kick in.  4/29- not voiding yet- don't want to put foley back in, because bladder won't start to work if has foley- will at least try for  A few more days.   5/3- is still being cathed- has not been able to void- will check with family if want him to go home with foley vs in/out caths-     5/5- will place foley- have nurse let me know when to place. Will go home with it.    5/6- will make f/u with Urology- also continue Flomax since might void in future.  15. Pancreatic cancer with recurrence: Continue Creon with meals. --follow up with Hem/onc and Radiation Onc after discharge.  -wife asking  about plans for radiation/chemo: will contact oncology to ask them to discuss with wife.  4/28- to start radiation 11/30/20 -is set up. At 1:30pm  5/2- asked SW to make sure therapy doesn't schedule during radiation- or afterwards  5/2- radiation got pushed to next week- will see if needs to  do before d/c.  5/5- let them know/radiation that has d/c next week.  5/6- pushed radiation back.   16. Low back pain/muscle spasms- will start Robaxin prn and lidoderm patches 1- 8pm to 8am.              4/16: denies pain: decrease robaxin to q12H prn.  17. Hypokalemia: scheduled supplement and repeat BMP Monday 18. Overweight BMI 25.52: provide counseling.  19. Elevated alk phos: increasing,  Oncology notified. Oncology has discussed plans with wife for post- CIR follow-up.  20. Right sided pleural effusion: could be contributing to his chest pain. Consulted IR regarding drainage/pleural studies given active cancer. ADDENDUM: s/p successful thoracentesis  -pt denies any SOB  4/25- VRE grew from Pleural effusion- ID to see today- linezolid started.    4/26- seen by ID- will con't Linizolid  4/28- will check CMP/CBC/CRP/ESR qMonday.  4/29-  Pleural abscess has decreased in size per CT- con't ABX   5/2- WBC down to 6.7- con't regimen 21. Hypertension with hypotension: continue to monitor BP TID  5/3- BP well controlled- con't regimen  5/4- had orthostatic hypotension yesterday- encouraged pt to drink (not drinking enough) and to take Midodrine 5 mg TID prn for low BP.   5/5- no hypotension so far today- con't regimen  5/6- had another episode of hypotension yesterday- will schedule 2.5 mg TID Midodrine- since never know when it will occur- also encouraged PO/liquid intake.  22. Loose stools in setting of previous constipation  5/5- had 3 loose BMs this AM- if continues, might need to check C Diff vs imodium? Will monitor  5/6- better- con't to monitor   LOS: 21 days A FACE TO [place] 12/03/2020, 10:02 AM

## 2020-12-03 NOTE — Progress Notes (Signed)
Physical Therapy Session Note  Patient Details  Name: LENIN KUHNLE MRN: [id number] Date of Birth: Feb 20, 1956  Today's Date: 12/03/2020 PT Individual Time: 1300-1350 PT Individual Time Calculation (min): 50 min   Short Term Goals: Week 3:  PT Short Term Goal 1 (Week 3): Pt will complete least restrictive transfer with min A consistently PT Short Term Goal 2 (Week 3): Pt will ambulate x 50 ft with LRAD and assist x 1 PT Short Term Goal 3 (Week 3): Pt will initiate stair training  Skilled Therapeutic Interventions/Progress Updates:   Pt received supine in bed and agreeable to PT. Supine>sit transfer with supervision assist fro safety. PT assisted pt to don shoes sitting EOB with supervision assist to maintain balance and lift BLE to allow proper positioning of shoes. Stand pivot transfer to [facility] with min assist from eelvated bed height. Pt transported to entrance of [facility] in [place]. Gait training with RW 3 x 78f with min assist and cues for improved posture and safety with turns. Pt performed sit<>stand with blocked practice x 5 with min assist fading to mod assist due to LE fatigue. Cues for use of BUE to push from arm rest. Pt returned to room and performed stand pivot transfer to bed with RW and supervision assist. Sit>supine completed with supervision assist and increased time for LE management. Pt left supine in bed with call bell in reach and all needs met. Throughout session pt reports mild s/s of orthostasis in standing with dizziness 3/10 at worst, which resolved in ~ 30sec of standing each time.        Therapy Documentation Precautions:  Precautions Precautions: Fall Restrictions Weight Bearing Restrictions: No RLE Weight Bearing: Weight bearing as tolerated General: PT Amount of Missed Time (min): 10 Minutes PT Missed Treatment Reason: Patient fatigue Vital Signs:  Pain:   Mobility:   Locomotion :    Trunk/Postural Assessment :    Balance:   Exercises:   Other  Treatments:      Therapy/Group: Individual Therapy  ALorie Phenix5/12/2020, 1:53 PM

## 2020-12-03 NOTE — Progress Notes (Signed)
Physical Therapy Session Note  Patient Details  Name: Colton Bush MRN: [id number] Date of Birth: 1956/05/07  Today's Date: 12/03/2020 PT Individual Time: 1015-1056 PT Individual Time Calculation (min): 41 min   Short Term Goals: Week 3:  PT Short Term Goal 1 (Week 3): Pt will complete least restrictive transfer with min A consistently PT Short Term Goal 2 (Week 3): Pt will ambulate x 50 ft with LRAD and assist x 1 PT Short Term Goal 3 (Week 3): Pt will initiate stair training  Skilled Therapeutic Interventions/Progress Updates:    [name], agreeable to therapy. Asked RN to remove running IV to allow improved mobility during today's session. Pt reports mild 2/10 R knee pain - rest breaks and repositioning provided for pain management. Thigh-high TED's on throughout session.  W/c transport for time management to main rehab gym. BP assessed in sitting reading 139/75 (HR83). Sit<>stand with light minA to RW and BP assessed in standing, reading 86/60 (HR 92). Pt reports only mild lightheadedness.  Gait 58ft with minA and RW from TIS w/c to mat table - antalgic gait with narrow BOS and short shuffling steps - cues for corrections and safety awareness with RW.   Repeated sit<>stands, 2x10, from mat table height to RW - cues needed for hand placement to push from mat rather than pulling up from RW. Assist level fading from minA to [place] with repetitions. (seated rest breaks needed b/w sets and sometimes b/w reps)  Alternating toe taps on 4inch platform with minA and RW, 2x5 sets with seated rest breaks provided. Pt c/o generalized fatigue, BP reassessed in sitting rading 128/70.   Stand<>pivot transfer with CGA and RW from mat table to TIS w/c - demonstrated improved awareness of hand placement and safety approach.   W/c transport back to his room with totalA for time management and completed stand<>pivot transfer with minA and no AD (used hospital bed  rail) from w/c to EOB. Able to complete sit>supine with CGA and boosts himself up in the bed while pulling up from headboard with supervision. Needs in reach at end of session.  Therapy Documentation Precautions:  Precautions Precautions: Fall Restrictions Weight Bearing Restrictions: No RLE Weight Bearing: Weight bearing as tolerated General:    Therapy/Group: Individual Therapy  [name] PT 12/03/2020, 7:37 AM

## 2020-12-03 NOTE — Progress Notes (Signed)
Occupational Therapy Session Note  Patient Details  Name: Colton Bush MRN: [id number] Date of Birth: 1955/08/24  Today's Date: 12/03/2020 OT Individual Time: 0700-0757 OT Individual Time Calculation (min): 57 min    Short Term Goals: Week 3:  OT Short Term Goal 1 (Week 3): LTG=STG 2/2 ELOS  Skilled Therapeutic Interventions/Progress Updates:    Pt resting in bed upon arrival and requested to use toilet. Supine>sit EOB with supervision using bed rails. Sit>stand and amb with RW to bathroom with CGA. Pt required min A for stand>sit standard toilet and mod A for sit>stand from toilet. Pt amb with RW to w/c at sink. Pt completed grooming and UB dressing tasks seated in w/c at sink. Pt agreeable to using BSC over toilet for future use of toilet. Pt c/o increased knee pain sit sit<>stand from toilet. Standing balance at sink X 5 with CGA/supervision. Pt remained in TIS w/c with belt alarm activated. All needs within reach.   Therapy Documentation Precautions:  Precautions Precautions: Fall Restrictions Weight Bearing Restrictions: No RLE Weight Bearing: Weight bearing as tolerated Pain:  Pt c/o Rt knee pain with amb and sit<>stand; repositioned   Therapy/Group: Individual Therapy  [name] 12/03/2020, 7:59 AM

## 2020-12-04 LAB — GLUCOSE, CAPILLARY
Glucose-Capillary: 140 mg/dL — ABNORMAL HIGH (ref 70–99)
Glucose-Capillary: 244 mg/dL — ABNORMAL HIGH (ref 70–99)
Glucose-Capillary: 248 mg/dL — ABNORMAL HIGH (ref 70–99)
Glucose-Capillary: 104 mg/dL — ABNORMAL HIGH (ref 70–99)

## 2020-12-04 MED ORDER — MIDODRINE HCL 5 MG PO TABS
5.0000 mg | ORAL_TABLET | Freq: Three times a day (TID) | ORAL | Status: DC
  Administered 2020-12-04 – 2020-12-08 (×12): 5 mg via ORAL
  Filled 2020-12-04 (×10): qty 1

## 2020-12-04 MED ORDER — METOCLOPRAMIDE HCL 5 MG PO TABS: 10.0000 mg | ORAL_TABLET | Freq: Three times a day (TID) | ORAL | Status: DC

## 2020-12-04 MED ORDER — METOCLOPRAMIDE HCL 5 MG PO TABS
10.0000 mg | ORAL_TABLET | Freq: Three times a day (TID) | ORAL | Status: AC
  Administered 2020-12-04 – 2020-12-07 (×9): 10 mg via ORAL
  Filled 2020-12-04 (×9): qty 2

## 2020-12-04 NOTE — Progress Notes (Signed)
Occupational Therapy Session Note  Patient Details  Name: Colton Bush MRN: [id number] Date of Birth: 12/07/1955  Today's Date: 12/04/2020 OT Individual Time: 1302-1350 OT Individual Time Calculation (min): 48 min    Short Term Goals: Week 3:  OT Short Term Goal 1 (Week 3): LTG=STG 2/2 ELOS  Skilled Therapeutic Interventions/Progress Updates:     Pt received seated in TIS, agreeable to therapy, but noted to be despondent. Reports feeling depressed re health and family circumstances. Extensive discussion and therapeutic use of self utilized to encourage pt to cont to participate in therapy and focus on his health. BP recorded 3x at beginning of session in TIS, read at 73/52. MD aware and present for assessment. Pt transported to 5th floor for change of scenery to support psychosocial health. Participated in 4 rounds of horse shoes for UB strengthening and activity tolerance. Improved affect noted after discuss pt leisure interest of coin collecting. Stand-pivot back to bed min A for balance. Returned to supine and readjusted self in bed close S.    Pt left semi-reclined in bed with RN present, bed alarm engaged, call bell in reach, and all immediate needs met.   Therapy Documentation Precautions:  Precautions Precautions: Fall Restrictions Weight Bearing Restrictions: No RLE Weight Bearing: Weight bearing as tolerated Pain:   denies ADL: See Care Tool for more details.   Therapy/Group: Individual Therapy  Volanda Napoleon MS, OTR/L  12/04/2020, 6:44 AM

## 2020-12-04 NOTE — Progress Notes (Signed)
Speech Language Pathology Daily Session Note  Patient Details  Name: OVIDIO STEELE MRN: [id number] Date of Birth: 1955/10/03  Today's Date: 12/04/2020 SLP Individual Time: 1118-1200 SLP Individual Time Calculation (min): 42 min  Short Term Goals: Week 4: SLP Short Term Goal 1 (Week 4): STG=LTG (anticipated discharge 5/11)  Skilled Therapeutic Interventions: Pt seen for skilled ST with focus on cognitive goals. Pt continues to endorse feeling depressed and having a very bad week. Pt able to recall events of therapy session earlier in the day and the fact that ST was present earlier than stated on schedule sheet. Pt reporting constant nausea, observed vomiting after med pass, RN aware and providing anti nausea medicine. Pt seemingly aware of current deficits, but not fully aware of impact they will have at home. SLP providing gentle education regarding need for assistance from wife for high level cognitive tasks, driving, etc. Pt continues to enjoy talking about collecting coins, collecting vinyls and woodworking. Pt will benefit from continued discharge planning, including safety, recreational activities and energy conservation strategies. Pt left upright in TIS wheelchair, alarm belt on and all needs within reach. Cont ST POC.   Pain Pain Assessment Pain Scale: 0-10 Pain Score: 0-No pain  Therapy/Group: Individual Therapy  Dewaine Conger 12/04/2020, 2:30 PM

## 2020-12-04 NOTE — Progress Notes (Signed)
Occupational Therapy Session Note  Patient Details  Name: Colton Bush MRN: [id number] Date of Birth: Oct 24, 1955  Today's Date: 12/04/2020 OT Individual Time: 0930-1015 OT Individual Time Calculation (min): 45 min    Short Term Goals: Week 1:  OT Short Term Goal 1 (Week 1): P twill transfer with +1 A and LRAD ot BSC OT Short Term Goal 1 - Progress (Week 1): Progressing toward goal OT Short Term Goal 2 (Week 1): Pt will sit to stand consistently with MOD A in prep for clothing managment OT Short Term Goal 2 - Progress (Week 1): Progressing toward goal OT Short Term Goal 3 (Week 1): Pt will don shirt iwht MIN A OT Short Term Goal 3 - Progress (Week 1): Met OT Short Term Goal 4 (Week 1): Pt will groom seated EOB with S OT Short Term Goal 4 - Progress (Week 1): Progressing toward goal  Skilled Therapeutic Interventions/Progress Updates: skilled OT session to educate and assisst patient with progress and increase self care and fuctnioal mobility/transfers as independently as possible as follows:  Donning socks edge of bed:  Patient completed left independently an dmaintained good balance.   Right required min A to don over toes.  Bed to w/c transfers = CGA   Sit to stand at sink to begin self care= closeS.  paitnet able to stand 5 second and hen stated he was dizzy and preferred to sit.  Oral care with setup  Upper body bathing and dressing in w/c at sink=setup.   He stated he complete lower body bathing and dressing with nursing staff.  Patient left seated with w/c reclined back at end of session with call bell and phone wthin reach.  cointinue OT Plan of care     Therapy Documentation Precautions:  Precautions Precautions: Fall Restrictions Weight Bearing Restrictions: No RLE Weight Bearing: Weight bearing as tolerated  Pain: Pain Assessment Pain Scale: 0-10 Pain Score: 0-No pain ADL:   Therapy/Group: Individual Therapy  Alfredia Ferguson [facility] 12/04/2020, 2:34 PM

## 2020-12-04 NOTE — Progress Notes (Addendum)
PROGRESS NOTE   Subjective:  Pt reports nausea is back in full effect- really bad- feels like he's losing weight  And "feels miserable" in spite of the antinausea meds he has. Having regular BMs.    Also admits still very depressed as well.   ROS:   Pt denies SOB, nausea and vomiting as above; , CP,  and vision changes  Objective:   DG Abd 1 View  Result Date: 12/02/2020 CLINICAL DATA:  Nausea and vomiting EXAM: ABDOMEN - 1 VIEW COMPARISON:  11/25/2020 FINDINGS: Scattered large and small bowel gas is noted. No obstructive changes are seen. No free air is noted. Postsurgical changes are noted in the right hip and mid abdomen. Degenerative changes of lumbar spine are seen. Bone island is noted within the left iliac bone. IMPRESSION: Postsurgical changes without acute abnormality. Electronically Signed   By: Inez Catalina M.D.   On: 12/02/2020 15:55   No results for input(s): WBC, HGB, HCT, PLT in the last 72 hours. No results for input(s): NA, K, CL, CO2, GLUCOSE, BUN, CREATININE, CALCIUM in the last 72 hours.  Intake/Output Summary (Last 24 hours) at 12/04/2020 1039 Last data filed at 12/04/2020 3235 Gross per 24 hour  Intake --  Output 1100 ml  Net -1100 ml     Pressure Injury 11/07/20 Heel Right Stage 1 -  Intact skin with non-blanchable redness of a localized area usually over a bony prominence. (Active)  11/07/20 0800  Location: Heel  Location Orientation: Right  Staging: Stage 1 -  Intact skin with non-blanchable redness of a localized area usually over a bony prominence.  Wound Description (Comments):   Present on Admission:      Pressure Injury 11/07/20 Heel Right Deep Tissue Pressure Injury - Purple or maroon localized area of discolored intact skin or blood-filled blister due to damage of underlying soft tissue from pressure and/or shear. (Active)  11/07/20 2000  Location: Heel  Location Orientation: Right  Staging:  Deep Tissue Pressure Injury - Purple or maroon localized area of discolored intact skin or blood-filled blister due to damage of underlying soft tissue from pressure and/or shear.  Wound Description (Comments):   Present on Admission:     Physical Exam: Vital Signs Blood pressure (!) 160/85, pulse 81, temperature 97.8 F (36.6 C), resp. rate 18, height 5' 11"  (1.803 m), weight 78 kg, SpO2 96 %.        General: awake, alert, appropriate, sitting up in bed; appears more gaunt in face; NAD HENT: conjugate gaze; oropharynx moist CV: regular rate; no JVD Pulmonary: CTA B/L; no W/R/R- good air movement GI: diffusely very mild TTP- no rebound; ND; normoactive BS Psychiatric: depressed affect; frustrated with GI issues Neurological:alert  Musculoskeletal:  Cervical back: Normal range of motionand neck supple.  Comments: RLE staples out.   UE's 5-/5 but equal B/L  LE's 4+/5 in HF, KE, DF and PF B/L  Skin: General: Skin is warmand dry. Low back B/L has excoriations from scratching and red/slightly excoriated L hamstring area.  Comments: RLE incisions as well as abd incisions already noted- staples out.  Intact to light touch in all 4 extremities     Assessment/Plan:  1. Functional deficits which require 3+ hours per day of interdisciplinary therapy in a comprehensive inpatient rehab setting.  Physiatrist is providing close team supervision and 24 hour management of active medical problems listed below.  Physiatrist and rehab team continue to assess barriers to discharge/monitor patient progress toward functional and medical goals  Care Tool:  Bathing    Body parts bathed by patient: Right arm,Left arm,Chest,Abdomen,Front perineal area,Right upper leg,Left upper leg,Left lower leg,Face   Body parts bathed by helper: Buttocks,Right lower leg     Bathing assist Assist Level: Minimal Assistance - Patient > 75%     Upper Body Dressing/Undressing Upper body  dressing   What is the patient wearing?: Pull over shirt    Upper body assist Assist Level: Set up assist    Lower Body Dressing/Undressing Lower body dressing      What is the patient wearing?: Pants     Lower body assist Assist for lower body dressing: Minimal Assistance - Patient > 75%     Toileting Toileting    Toileting assist Assist for toileting: Maximal Assistance - Patient 25 - 49%     Transfers Chair/bed transfer  Transfers assist     Chair/bed transfer assist level: Minimal Assistance - Patient > 75%     Locomotion Ambulation   Ambulation assist   Ambulation activity did not occur: Safety/medical concerns  Assist level: 2 helpers Assistive device: Walker-rolling Max distance: 25'   Walk 10 feet activity   Assist  Walk 10 feet activity did not occur: Safety/medical concerns  Assist level: 2 helpers Assistive device: Walker-rolling   Walk 50 feet activity   Assist Walk 50 feet with 2 turns activity did not occur: Safety/medical concerns         Walk 150 feet activity   Assist Walk 150 feet activity did not occur: Safety/medical concerns         Walk 10 feet on uneven surface  activity   Assist Walk 10 feet on uneven surfaces activity did not occur: Safety/medical concerns         Wheelchair     Assist Will patient use wheelchair at discharge?: Yes Type of Wheelchair: Manual Wheelchair activity did not occur: Safety/medical concerns  Wheelchair assist level: Supervision/Verbal cueing Max wheelchair distance: 100'    Wheelchair 50 feet with 2 turns activity    Assist    Wheelchair 50 feet with 2 turns activity did not occur: Safety/medical concerns   Assist Level: Supervision/Verbal cueing   Wheelchair 150 feet activity     Assist  Wheelchair 150 feet activity did not occur: Safety/medical concerns       Blood pressure (!) 160/85, pulse 81, temperature 97.8 F (36.6 C), resp. rate 18, height 5'  11" (1.803 m), weight 78 kg, SpO2 96 %.  Medical Problem List and Plan: 1.L frontal craniotomysecondary to LDH/SAH from fall -patient may Shower if incisions covered -ELOS/Goals: 2-3 weeks- mod I to supervision          -continue PT and OT- explained to pt, they are concerned about "early d/c"_ that if he hasn't made enough gains, can prolong stay.   5/7- con't PT and OT as tolerated- pt still very nauseated  2. Impaired mobility: -DVT/anticoagulation: Pharmaceutical: Continue Lovenox -antiplatelet therapy: N/a 3. Low back pain: continue air mattress. Resolved. Dicussed XR findings of degeneration.   4/25- was having back pain- and R knee pain- if doesn't improve, will schedule Am pain meds  5/2- pain doing OK- better in  knees especially- con't regimen  5/6- pain was worse in R knee this AM- buckled slightly when on toilet- con't pain regimen for now  5/7- pain overall controlled- con't regimen 4. Mood: LCSW to follow for evaluation and support.  5/5- starting Celexa 20 mg daily- doesn't feel theres "any reason to live".    5/6- pt happy that "he's not the only one" with depression in this situation- OK with Celexa- might need Remeron if appetite doesn't pick up?  5/7- still very depressed- takes a few days to kick in- pt very frustrated- will monitor -antipsychotic agents: N/A 5. Neuropsych: This patient is capable of making decisions on his own behalf. 6. Multiple deep tissue injuries/Skin/Wound Care: Continue Air mattress overlay with pressure relief measures.  --Continue vitamins and collagen to promote wound healing  7. Fluids/Electrolytes/Nutrition: Monitor I/O. Check lytes in am. 8. SDH (concerns of bacterial infection): S/p Burr hole evacuation  9. Strep anginosis bacteremia: Continue Ceftriaxone/Metronidazole X 6 weeks w/end date  10 Liver abscess s/p JP drain placement: Continue flushes TID and document output.    -IR is following.  4/28- getting CT scan on abd/pelvis- with contrast- this AM- pending results-   4/29- looks a little better- con't IV ABX-   5/2- JP drain removed!- con't wound care daily and prn 11. New onset seizures: Continue Keppra bid till follow up with neuro on outpatient basis.  12. Left hip ORIF: WBAT. Will consult ortho to eval given worsening pain. Added MS contin for better pain control/participation with therapy. Appears to have helped, continue.   4/26- will remove staples 13. T2DM: Hgb A1C- 10.8. Will monitor BS ac/hs. Home regimen Levemir 10u am/15u pm with SSI.  --continue Levemir 13u bid with SSI and titrate insulin as indicated.             -4/23 improved control  4/25- BGs 68-256 in last 24 hours- much more labile today- if still an issue tomorrow, will ask DM coordinators.   5/7- BGs doing overall better, because not eating- will wait to cal Dm coordinator 14. BPH/Urinary retention: Foley replaced--keep in place for decompression for few days then start bladder program next week?.  --will increase flomax to 0.8 mg/HS  5/3- is still being cathed- has not been able to void- will check with family if want him to go home with foley vs in/out caths-     5/5- will place foley- have nurse let me know when to place. Will go home with it.    5/6- will make f/u with Urology- also continue Flomax since might void in future.  15. Pancreatic cancer with recurrence: Continue Creon with meals. --follow up with Hem/onc and Radiation Onc after discharge.  -wife asking about plans for radiation/chemo: will contact oncology to ask them to discuss with wife.  4/28- to start radiation 11/30/20 -is set up. At 1:30pm  5/2- asked SW to make sure therapy doesn't schedule during radiation- or afterwards  5/2- radiation got pushed to next week- will see if needs to do before d/c.  5/5- let them know/radiation that has d/c next week.  5/6- pushed radiation  back.   16. Low back pain/muscle spasms- will start Robaxin prn and lidoderm patches 1- 8pm to 8am.              4/16: denies pain: decrease robaxin to q12H prn.  17. Hypokalemia: scheduled supplement and repeat BMP Monday 18. Overweight BMI 25.52: provide counseling.  19. Elevated alk phos: increasing,  Oncology notified.  Oncology has discussed plans with wife for post- CIR follow-up.  20. Right sided pleural effusion: could be contributing to his chest pain. Consulted IR regarding drainage/pleural studies given active cancer. ADDENDUM: s/p successful thoracentesis  -pt denies any SOB  4/25- VRE grew from Pleural effusion- ID to see today- linezolid started.    4/26- seen by ID- will con't Linizolid  4/28- will check CMP/CBC/CRP/ESR qMonday.  4/29-  Pleural abscess has decreased in size per CT- con't ABX   5/2- WBC down to 6.7- con't regimen 21. Hypertension with hypotension: continue to monitor BP TID  5/3- BP well controlled- con't regimen  5/4- had orthostatic hypotension yesterday- encouraged pt to drink (not drinking enough) and to take Midodrine 5 mg TID prn for low BP.   5/5- no hypotension so far today- con't regimen  5/6- had another episode of hypotension yesterday- will schedule 2.5 mg TID Midodrine- since never know when it will occur- also encouraged PO/liquid intake.  22. Loose stools in setting of previous constipation  5/5- had 3 loose BMs this AM- if continues, might need to check C Diff vs imodium? Will monitor  5/6- better- con't to monitor 23. Nausea/Vomiting  5/7- getting worse per pt- and "losing weight" per pt- will order daily weights; and called GI- they suggested reglan 10 mg TID for now and will see him when possible.    Stopped and saw pt again due to low BP 73/42- was concerned and stopped therapy from standing pt today.  Will increase midodrine to 5 mg TID with meals, recheck labs in AM and also assess if needs IV fluids- asked nursing to give  gatorade/sprite, gingerale, etc for nausea. Since not drinking well. Spent 35 minutes on total care today- with GI consult, as well as BP issues- >50% coordination of care-   LOS: 22 days A FACE TO FACE EVALUATION WAS PERFORMED  Shron Ozer 12/04/2020, 10:39 AM

## 2020-12-05 DIAGNOSIS — R112 Nausea with vomiting, unspecified: Secondary | ICD-10-CM

## 2020-12-05 LAB — GLUCOSE, CAPILLARY
Glucose-Capillary: 104 mg/dL — ABNORMAL HIGH (ref 70–99)
Glucose-Capillary: 147 mg/dL — ABNORMAL HIGH (ref 70–99)
Glucose-Capillary: 162 mg/dL — ABNORMAL HIGH (ref 70–99)
Glucose-Capillary: 126 mg/dL — ABNORMAL HIGH (ref 70–99)

## 2020-12-05 LAB — CBC WITH DIFFERENTIAL/PLATELET
Abs Immature Granulocytes: 0.02 10*3/uL (ref 0.00–0.07)
Basophils Absolute: 0.1 10*3/uL (ref 0.0–0.1)
Basophils Relative: 2 %
Eosinophils Absolute: 0.1 10*3/uL (ref 0.0–0.5)
Eosinophils Relative: 2 %
HCT: 30.2 % — ABNORMAL LOW (ref 39.0–52.0)
Hemoglobin: 9.7 g/dL — ABNORMAL LOW (ref 13.0–17.0)
Immature Granulocytes: 0 %
Lymphocytes Relative: 21 %
Lymphs Abs: 1.3 10*3/uL (ref 0.7–4.0)
MCH: 28.9 pg (ref 26.0–34.0)
MCHC: 32.1 g/dL (ref 30.0–36.0)
MCV: 89.9 fL (ref 80.0–100.0)
Monocytes Absolute: 0.7 10*3/uL (ref 0.1–1.0)
Monocytes Relative: 11 %
Neutro Abs: 3.9 10*3/uL (ref 1.7–7.7)
Neutrophils Relative %: 64 %
Platelets: 504 10*3/uL — ABNORMAL HIGH (ref 150–400)
RBC: 3.36 MIL/uL — ABNORMAL LOW (ref 4.22–5.81)
RDW: 16.9 % — ABNORMAL HIGH (ref 11.5–15.5)
WBC: 6.1 10*3/uL (ref 4.0–10.5)
nRBC: 0 % (ref 0.0–0.2)

## 2020-12-05 LAB — BASIC METABOLIC PANEL
Anion gap: 4 — ABNORMAL LOW (ref 5–15)
BUN: 6 mg/dL — ABNORMAL LOW (ref 8–23)
CO2: 27 mmol/L (ref 22–32)
Calcium: 8.3 mg/dL — ABNORMAL LOW (ref 8.9–10.3)
Chloride: 102 mmol/L (ref 98–111)
Creatinine, Ser: 0.47 mg/dL — ABNORMAL LOW (ref 0.61–1.24)
GFR, Estimated: >60 mL/min (ref >60)
Glucose, Bld: 104 mg/dL — ABNORMAL HIGH (ref 70–99)
Potassium: 3.9 mmol/L (ref 3.5–5.1)
Sodium: 133 mmol/L — ABNORMAL LOW (ref 135–145)

## 2020-12-05 NOTE — Progress Notes (Signed)
PROGRESS NOTE   Subjective:  Pt reports nausea a LOT better- "fine today"- Ate 100% breakfast.   "back up to 170 lbs" per pt. But is actually 76 kg.  Mood a little better.    ROS:   Pt denies SOB, abd pain, CP, N/V/C/D, and vision changes   Objective:   No results found. Recent Labs    12/05/20 0530  WBC 6.1  HGB 9.7*  HCT 30.2*  PLT 504*   Recent Labs    12/05/20 0530  NA 133*  K 3.9  CL 102  CO2 27  GLUCOSE 104*  BUN 6*  CREATININE 0.47*  CALCIUM 8.3*    Intake/Output Summary (Last 24 hours) at 12/05/2020 1240 Last data filed at 12/05/2020 0800 Gross per 24 hour  Intake 960.94 ml  Output 1525 ml  Net -564.06 ml     Pressure Injury 11/07/20 Heel Right Stage 1 -  Intact skin with non-blanchable redness of a localized area usually over a bony prominence. (Active)  11/07/20 0800  Location: Heel  Location Orientation: Right  Staging: Stage 1 -  Intact skin with non-blanchable redness of a localized area usually over a bony prominence.  Wound Description (Comments):   Present on Admission:      Pressure Injury 11/07/20 Heel Right Deep Tissue Pressure Injury - Purple or maroon localized area of discolored intact skin or blood-filled blister due to damage of underlying soft tissue from pressure and/or shear. (Active)  11/07/20 2000  Location: Heel  Location Orientation: Right  Staging: Deep Tissue Pressure Injury - Purple or maroon localized area of discolored intact skin or blood-filled blister due to damage of underlying soft tissue from pressure and/or shear.  Wound Description (Comments):   Present on Admission:     Physical Exam: Vital Signs Blood pressure (!) 142/74, pulse 64, temperature 98 F (36.7 C), temperature source Oral, resp. rate 16, height 5' 11"  (1.803 m), weight 76 kg, SpO2 95 %.         General: awake, alert, appropriate, sitting up in bed; gaunt face; NAD HENT: conjugate  gaze; oropharynx moist CV: regular rate; no JVD Pulmonary: CTA B/L; no W/R/R- good air movement GI: soft, NT, ND, (+)BS Psychiatric: less depressed Neurological: delayed responses  Musculoskeletal:  Cervical back: Normal range of motionand neck supple.  Comments: RLE staples out.   UE's 5-/5 but equal B/L  LE's 4+/5 in HF, KE, DF and PF B/L  Skin: General: Skin is warmand dry. Low back B/L has excoriations from scratching and red/slightly excoriated L hamstring area.  Comments: RLE incisions as well as abd incisions already noted- staples out.  Intact to light touch in all 4 extremities     Assessment/Plan: 1. Functional deficits which require 3+ hours per day of interdisciplinary therapy in a comprehensive inpatient rehab setting.  Physiatrist is providing close team supervision and 24 hour management of active medical problems listed below.  Physiatrist and rehab team continue to assess barriers to discharge/monitor patient progress toward functional and medical goals  Care Tool:  Bathing    Body parts bathed by patient: Right arm,Left arm,Chest,Abdomen,Front perineal area,Right upper leg,Left upper leg,Left lower leg,Face   Body  parts bathed by helper: Buttocks,Right lower leg     Bathing assist Assist Level: Minimal Assistance - Patient > 75%     Upper Body Dressing/Undressing Upper body dressing   What is the patient wearing?: Pull over shirt    Upper body assist Assist Level: Set up assist    Lower Body Dressing/Undressing Lower body dressing      What is the patient wearing?: Pants     Lower body assist Assist for lower body dressing: Minimal Assistance - Patient > 75%     Toileting Toileting    Toileting assist Assist for toileting: Maximal Assistance - Patient 25 - 49%     Transfers Chair/bed transfer  Transfers assist     Chair/bed transfer assist level: Minimal Assistance - Patient > 75%      Locomotion Ambulation   Ambulation assist   Ambulation activity did not occur: Safety/medical concerns  Assist level: 2 helpers Assistive device: Walker-rolling Max distance: 25'   Walk 10 feet activity   Assist  Walk 10 feet activity did not occur: Safety/medical concerns  Assist level: 2 helpers Assistive device: Walker-rolling   Walk 50 feet activity   Assist Walk 50 feet with 2 turns activity did not occur: Safety/medical concerns         Walk 150 feet activity   Assist Walk 150 feet activity did not occur: Safety/medical concerns         Walk 10 feet on uneven surface  activity   Assist Walk 10 feet on uneven surfaces activity did not occur: Safety/medical concerns         Wheelchair     Assist Will patient use wheelchair at discharge?: Yes Type of Wheelchair: Manual Wheelchair activity did not occur: Safety/medical concerns  Wheelchair assist level: Supervision/Verbal cueing Max wheelchair distance: 100'    Wheelchair 50 feet with 2 turns activity    Assist    Wheelchair 50 feet with 2 turns activity did not occur: Safety/medical concerns   Assist Level: Supervision/Verbal cueing   Wheelchair 150 feet activity     Assist  Wheelchair 150 feet activity did not occur: Safety/medical concerns       Blood pressure (!) 142/74, pulse 64, temperature 98 F (36.7 C), temperature source Oral, resp. rate 16, height 5' 11"  (1.803 m), weight 76 kg, SpO2 95 %.  Medical Problem List and Plan: 1.L frontal craniotomysecondary to LDH/SAH from fall -patient may Shower if incisions covered -ELOS/Goals: 2-3 weeks- mod I to supervision          -continue PT and OT- explained to pt, they are concerned about "early d/c"_ that if he hasn't made enough gains, can prolong stay.   5/8- con't PT and OT  2. Impaired mobility: -DVT/anticoagulation: Pharmaceutical: Continue Lovenox -antiplatelet therapy:  N/a 3. Low back pain: continue air mattress. Resolved. Dicussed XR findings of degeneration.   4/25- was having back pain- and R knee pain- if doesn't improve, will schedule Am pain meds  5/2- pain doing OK- better in knees especially- con't regimen  5/6- pain was worse in R knee this AM- buckled slightly when on toilet- con't pain regimen for now  5/8- pain controlled- con't regimen 4. Mood: LCSW to follow for evaluation and support.  5/5- starting Celexa 20 mg daily- doesn't feel theres "any reason to live".    5/6- pt happy that "he's not the only one" with depression in this situation- OK with Celexa- might need Remeron if appetite doesn't pick up?  5/7-  still very depressed- takes a few days to kick in- pt very frustrated- will monitor  5/8- pt feeling better today- con't regimen -antipsychotic agents: N/A 5. Neuropsych: This patient is capable of making decisions on his own behalf. 6. Multiple deep tissue injuries/Skin/Wound Care: Continue Air mattress overlay with pressure relief measures.  --Continue vitamins and collagen to promote wound healing  7. Fluids/Electrolytes/Nutrition: Monitor I/O. Check lytes in am. 8. SDH (concerns of bacterial infection): S/p Burr hole evacuation  9. Strep anginosis bacteremia: Continue Ceftriaxone/Metronidazole X 6 weeks w/end date  10 Liver abscess s/p JP drain placement: Continue flushes TID and document output.   -IR is following.  4/28- getting CT scan on abd/pelvis- with contrast- this AM- pending results-   4/29- looks a little better- con't IV ABX-   5/2- JP drain removed!- con't wound care daily and prn 11. New onset seizures: Continue Keppra bid till follow up with neuro on outpatient basis.  12. Left hip ORIF: WBAT. Will consult ortho to eval given worsening pain. Added MS contin for better pain control/participation with therapy. Appears to have helped, continue.   4/26- will remove staples 13. T2DM: Hgb A1C-  10.8. Will monitor BS ac/hs. Home regimen Levemir 10u am/15u pm with SSI.  --continue Levemir 13u bid with SSI and titrate insulin as indicated.             -4/23 improved control  4/25- BGs 68-256 in last 24 hours- much more labile today- if still an issue tomorrow, will ask DM coordinators.   5/7- BGs doing overall better, because not eating- will wait to call Dm coordinator  5/8-  BGs much better- con't reigmen 14. BPH/Urinary retention: Foley replaced--keep in place for decompression for few days then start bladder program next week?.  --will increase flomax to 0.8 mg/HS  5/3- is still being cathed- has not been able to void- will check with family if want him to go home with foley vs in/out caths-     5/5- will place foley- have nurse let me know when to place. Will go home with it.    5/6- will make f/u with Urology- also continue Flomax since might void in future.  15. Pancreatic cancer with recurrence: Continue Creon with meals. --follow up with Hem/onc and Radiation Onc after discharge.  -wife asking about plans for radiation/chemo: will contact oncology to ask them to discuss with wife.  4/28- to start radiation 11/30/20 -is set up. At 1:30pm  5/2- asked SW to make sure therapy doesn't schedule during radiation- or afterwards  5/2- radiation got pushed to next week- will see if needs to do before d/c.  5/5- let them know/radiation that has d/c next week.  5/6- pushed radiation back.    5/8- weight down even more- will discuss starting supplements.  16. Low back pain/muscle spasms- will start Robaxin prn and lidoderm patches 1- 8pm to 8am.              4/16: denies pain: decrease robaxin to q12H prn.  17. Hypokalemia: scheduled supplement and repeat BMP Monday 18. Overweight BMI 25.52: provide counseling.  19. Elevated alk phos: increasing,  Oncology notified. Oncology has discussed plans with wife for post- CIR follow-up.  20. Right sided pleural  effusion: could be contributing to his chest pain. Consulted IR regarding drainage/pleural studies given active cancer. ADDENDUM: s/p successful thoracentesis  -pt denies any SOB  4/25- VRE grew from Pleural effusion- ID to see today- linezolid started.    4/26-  seen by ID- will con't Linizolid  4/28- will check CMP/CBC/CRP/ESR qMonday.  4/29-  Pleural abscess has decreased in size per CT- con't ABX   5/2- WBC down to 6.7- con't regimen  5/9- don't know when IV ABX to stop- might need PICC 21. Hypertension with hypotension: continue to monitor BP TID  5/3- BP well controlled- con't regimen  5/4- had orthostatic hypotension yesterday- encouraged pt to drink (not drinking enough) and to take Midodrine 5 mg TID prn for low BP. n  5/6- had another episode of hypotension yesterday- will schedule 2.5 mg TID Midodrine- since never know when it will occur- also encouraged PO/liquid intake.   5/8- increased midodrine to 5 mg TID.  22. Loose stools in setting of previous constipation  5/5- had 3 loose BMs this AM- if continues, might need to check C Diff vs imodium? Will monitor  5/6- better- con't to monitor 23. Nausea/Vomiting  5/7- getting worse per pt- and "losing weight" per pt- will order daily weights; and called GI- they suggested reglan 10 mg TID for now and will see him when possible.   5/8- nausea resolved today- con't reglan for 3 days.     LOS: 23 days A FACE TO FACE EVALUATION WAS PERFORMED  Kindred Heying 12/05/2020, 12:40 PM

## 2020-12-05 NOTE — Progress Notes (Signed)
Speech Language Pathology Daily Session Note  Patient Details  Name: YUG LORIA MRN: [id number] Date of Birth: 03-13-1956  Today's Date: 12/05/2020 SLP Individual Time: 1300-1345 SLP Individual Time Calculation (min): 45 min  Short Term Goals: Week 4: SLP Short Term Goal 1 (Week 4): STG=LTG (anticipated discharge 5/11)  Skilled Therapeutic Interventions:  Pt was seen for skilled ST targeting cognitive goals.  Pt was seated upright in wheelchair and noted with brighter affect in comparison to previous therapy sessions.  Pt reported that he'd had a "good day" thus far.  Pt was able to recall activities from previous therapy sessions with supervision question cues.  During conversations with therapist, pt indicated that he was eager to get home but still needed subtle cues to identify activities that are safe for him to participate in versus those in which he will need more assistance.  Pt was left in wheelchair with chair alarm set and call bell within reach.  Continue per current plan of care.   Pain Pain Assessment Pain Scale: 0-10 Pain Score: 0-No pain  Therapy/Group: Individual Therapy  Aiden Rao, Selinda Orion 12/05/2020, 3:36 PM

## 2020-12-05 NOTE — Consult Note (Addendum)
Consultation  Referring Provider:     Courtney Heys, MD Primary Care Physician:  [name], MD Primary Gastroenterologist:        Dr. Ardis Hughs Reason for Consultation:     Nausea, vomiting         HPI:   Colton Bush is a 65 y.o. male with a history of pancreatic CA diagnosed 2017 treated with neoadjuvant chemotherapy/radiation, Xeloda, aborted Whipple in 08/2016 (CA involving SMV, SMA and portal veins) with further chemotherapy and subsequent Whipple at [facility] in 10/2016 followed by more chemotherapy and local recurrence in 08/2020 which was nonresectable and referred for radiation.  Patient was admitted 11/05/2020 with sepsis, liver abscess (IR drain) E. coli UTI, seizures and eventual bur hole on 4/12.  GI service consulted for development of nausea and nonbloody emesis.  Telephone recommendations yesterday to start Reglan 10 mg p.o. Q8H.  On my evaluation today, he reports feeling "100% better" and tolerating all p.o. intake, to include grilled cheese at lunch today.  Past Medical History:  Diagnosis Date  . Arthritis    left hand  . Bronchitis 1977  . Cancer ([place]) 03/09/2016   pancreatic cancer, sees Dr. Cristino Martes at [facility]   . Depression    takes Cymbalta daily  . Diabetes mellitus type II    sees Dr. Chalmers Cater   . GERD (gastroesophageal reflux disease)    takes Omeprazole daily  . H/O hiatal hernia   . Hyperlipidemia    takes Zocor daily  . Hypertension    takes Amlodipine daily  . Hypoglycemia 06/18/2017  . Neck pain    C4-7 stenosis and herniated disc  . Neuromuscular disorder ([place])    hiatal hernia  . Scoliosis    slight  . Spinal cord injury, C5-C7 ([place])    c4-c7  . Stiffness of hand joint    d/t cervical issues    Past Surgical History:  Procedure Laterality Date  . ANTERIOR CERVICAL DECOMP/DISCECTOMY FUSION  08/18/2011   Procedure: ANTERIOR CERVICAL DECOMPRESSION/DISCECTOMY FUSION 3 LEVELS;  Surgeon: Winfield Cunas, MD;  Location: [place] NEUR[facility] ORS;  Service:  Neurosurgery;  Laterality: N/A;  Anterior Cervical Four-Five/Five-Six/Six-Seven Decompression with Fusion, Plating, and Bonegraft  . BURR HOLE Left 11/09/2020   Procedure: LEFT BURR HOLES FOR EVACUATION OF Subdural Hematoma;  Surgeon: Judith Part, MD;  Location: [place];  Service: Neurosurgery;  Laterality: Left;  . CARPAL TUNNEL RELEASE  2013   bilateral, per Dr. Christella Noa   . COLONOSCOPY  10-30-14   per Dr. Olevia Perches, clear, repeat in 10 yrs   . egd with esophageal dilation  9-08   per Dr. Olevia Perches  . ERCP N/A 03/01/2016   Procedure: ENDOSCOPIC RETROGRADE CHOLANGIOPANCREATOGRAPHY (ERCP) with brushings and stent;  Surgeon: Doran Stabler, MD;  Location: [facility];  Service: Endoscopy;  Laterality: N/A;  . ESOPHAGOGASTRODUODENOSCOPY (EGD) WITH PROPOFOL N/A 05/27/2020   Procedure: ESOPHAGOGASTRODUODENOSCOPY (EGD) WITH PROPOFOL;  Surgeon: Milus Banister, MD;  Location: [facility];  Service: Endoscopy;  Laterality: N/A;  . EUS N/A 03/09/2016   Procedure: ESOPHAGEAL ENDOSCOPIC ULTRASOUND (EUS) RADIAL;  Surgeon: Milus Banister, MD;  [facility]: [facility];  Service: Endoscopy;  Laterality: N/A;  . EUS N/A 05/27/2020   Procedure: UPPER ENDOSCOPIC ULTRASOUND (EUS) RADIAL;  Surgeon: Milus Banister, MD;  Location: [facility];  Service: Endoscopy;  Laterality: N/A;  . FEMUR IM NAIL Right 10/25/2020   Procedure: INTRAMEDULLARY (IM) NAIL FEMORAL;  Surgeon: Rod Can, MD;  Location: [facility];  Service: Orthopedics;  Laterality: Right;  . IR THORACENTESIS ASP PLEURAL SPACE W/IMG GUIDE  11/19/2020  . lymph nodes biopsy    . melanoma rt calf  1999  . PORT-A-CATH REMOVAL N/A 04/03/2019   Procedure: PORT REMOVAL;  Surgeon: Stark Klein, MD;  Location: [place];  Service: General;  Laterality: N/A;  . PORTACATH PLACEMENT Left 03/22/2016   Procedure: INSERTION PORT-A-CATH;  Surgeon: Stark Klein, MD;  Location: [facility];  Service: General;  Laterality: Left;  . SPINE SURGERY    . TONSILLECTOMY     as a  child  . ULNAR TUNNEL RELEASE  2013   right arm, per Dr. Christella Noa   . UPPER GASTROINTESTINAL ENDOSCOPY    . WHIPPLE PROCEDURE N/A 09/19/2016   Procedure: DIAGNOSTIC LAPAROSCOPY, LAPAROSCOPIC LIVER BIOPSY, RETROPERITONEAL EXPLORATION, INTRAOPERATIVE ULTRASOUND;  Surgeon: Stark Klein, MD;  Location: [facility];  Service: General;  Laterality: N/A;    Family History  Problem Relation Age of Onset  . Heart disease Father   . Heart disease Brother 82  . Anesthesia problems Mother   . Heart disease Mother   . Dementia Mother   . Diabetes Sister   . Stroke Sister   . Colon cancer Neg Hx   . Rectal cancer Neg Hx   . Stomach cancer Neg Hx      Social History   Tobacco Use  . Smoking status: Never Smoker  . Smokeless tobacco: Never Used  . Tobacco comment: tried a pipe 35 years ago   Vaping Use  . Vaping Use: Never used  Substance Use Topics  . Alcohol use: No    Alcohol/week: 0.0 standard drinks  . Drug use: No    Prior to Admission medications   Medication Sig Start Date End Date Taking? Authorizing Provider  cefTRIAXone (ROCEPHIN) IVPB Inject 2 g into the vein every 12 (twelve) hours. Indication:  liver abscess and possible infected subdural hematoma First Dose: No Last Day of Therapy:  12/21/20 Labs - Once weekly:  CBC/D and BMP, Labs - Every other week:  ESR and CRP Method of administration: IV Push Method of administration may be changed at the discretion of home infusion pharmacist based upon assessment of the patient and/or caregiver's ability to self-administer the medication ordered. 11/12/20   Debbe Odea, MD  CREON 6000-19000 units CPEP Take 1 capsule by mouth with breakfast, with lunch, and with evening meal.  04/15/20   [provider]  enoxaparin (LOVENOX) 40 MG/0.4ML injection Inject 0.4 mLs (40 mg total) into the skin daily. 11/13/20   Debbe Odea, MD  ferrous sulfate 325 (65 FE) MG tablet Take 325 mg daily with breakfast by mouth.    [provider]   insulin detemir (LEVEMIR) 100 UNIT/ML injection Inject 0.1 mLs (10 Units total) into the skin 2 (two) times daily. 11/12/20   Debbe Odea, MD  levETIRAcetam (KEPPRA) 1000 MG tablet Take 1 tablet (1,000 mg total) by mouth 2 (two) times daily. 11/12/20   Debbe Odea, MD  loratadine (CLARITIN) 10 MG tablet Take 10 mg by mouth daily.    [provider]  metroNIDAZOLE (FLAGYL) 500 MG tablet Take 1 tablet (500 mg total) by mouth 3 (three) times daily. Treat through 12/21/20 11/12/20   Debbe Odea, MD  Multiple Vitamin (MULTIVITAMIN WITH MINERALS) TABS tablet Take 1 tablet by mouth daily.    [provider]  NOVOLOG FLEXPEN 100 UNIT/ML FlexPen GIVE EVERY MORNING WITH BREAKFAST AND EVERY EVENING WITH SUPPER PER SLIDING SCALE Patient taking differently:  Inject 2-13 Units into the skin 2 (two) times daily. Sliding Scale : 101-150 - 2u 151-200 - 3u 201-250 - 5u 251-300 - 7u 301-350 - 9u 351- 400 - 11u 401-450 - 13u Greater than 150, alert MD 04/17/17   [name], MD  Omega-3 Fatty Acids (FISH OIL) 500 MG CAPS Take 500 mg by mouth daily.    [provider]  omeprazole (PRILOSEC) 20 MG capsule TAKE 1 CAPSULE DAILY 11/17/20   [name], MD  polyethylene glycol (MIRALAX / GLYCOLAX) 17 g packet Take 17 g by mouth daily as needed for mild constipation. 11/12/20   Debbe Odea, MD  rosuvastatin (CRESTOR) 20 MG tablet Take 1 tablet (20 mg total) by mouth daily. 07/22/20   [name], MD  tamsulosin (FLOMAX) 0.4 MG CAPS capsule Take 1 capsule (0.4 mg total) by mouth daily. 10/29/20   [place] Eric J, DO    Current Facility-Administered Medications  Medication Dose Route Frequency Provider Last Rate Last Admin  . 0.9 %  sodium chloride infusion   Intravenous PRN Courtney Heys, MD 10 mL/hr at 12/03/20 0903 New Bag at 12/03/20 0903  . acetaminophen (TYLENOL) tablet 325-650 mg  325-650 mg Oral Q4H PRN Marcus, Schwandt, PA-C   650 mg at 11/26/20 1311  . alum & mag  hydroxide-simeth (MAALOX/MYLANTA) 200-200-20 MG/5ML suspension 30 mL  30 mL Oral Q4H PRN Patryk, Conant, PA-C   30 mL at 11/25/20 1225  . ascorbic acid (VITAMIN C) tablet 500 mg  500 mg Oral Daily Manan, Olmo, PA-C   500 mg at 12/05/20 0102  . bisacodyl (DULCOLAX) suppository 10 mg  10 mg Rectal Daily PRN Diamond, Pamela S, PA-C      . cefTRIAXone (ROCEPHIN) 2 g in sodium chloride 0.9 % 100 mL IVPB  2 g Intravenous Q12H Beg, Pamela S, PA-C 200 mL/hr at 12/05/20 0800 2 g at 12/05/20 0800  . Chlorhexidine Gluconate Cloth 2 % PADS 6 each  6 each Topical Daily Kirsteins, Luanna Salk, MD   6 each at 12/05/20 [phone]  . citalopram (CELEXA) tablet 20 mg  20 mg Oral Daily Lovorn, Megan, MD   20 mg at 12/05/20 0801  . diclofenac Sodium (VOLTAREN) 1 % topical gel 2 g  2 g Topical QID Hoeppner, Pamela S, PA-C   2 g at 12/05/20 1125  . diphenhydrAMINE (BENADRYL) 12.5 MG/5ML elixir 12.5-25 mg  12.5-25 mg Oral Q6H PRN Dickie, Pamela S, PA-C      . enoxaparin (LOVENOX) injection 40 mg  40 mg Subcutaneous Q24H Pryer, Pamela S, PA-C   40 mg at 12/04/20 2004  . guaiFENesin-dextromethorphan (ROBITUSSIN DM) 100-10 MG/5ML syrup 5-10 mL  5-10 mL Oral Q6H PRN Vida, Pamela S, PA-C      . hydrocerin (EUCERIN) cream   Topical BID Courtney Heys, MD   Given at 12/05/20 0805  . HYDROcodone-acetaminophen (NORCO/VICODIN) 5-325 MG per tablet 1 tablet  1 tablet Oral Q3H PRN Placido, Hangartner, PA-C   1 tablet at 11/23/20 1142  . hydrocortisone cream 1 %   Topical BID Courtney Heys, MD   Given at 12/05/20 0805  . insulin aspart (novoLOG) injection 0-15 Units  0-15 Units Subcutaneous TID WC Matthe, Sloane, PA-C   2 Units at 12/05/20 1124  . insulin aspart (novoLOG) injection 0-5 Units  0-5 Units Subcutaneous QHS Ritchie, Klee, PA-C   2 Units at 11/27/20 2259  . insulin aspart protamine- aspart (NOVOLOG MIX 70/30) injection 17 Units  17 Units Subcutaneous BID WC Courtney Heys, MD   17 Units at 12/05/20 0806  . levalbuterol (XOPENEX) nebulizer  solution 0.63 mg  0.63 mg Nebulization Q6H PRN Lovorn, Megan, MD      . levETIRAcetam (KEPPRA) tablet 1,000 mg  1,000 mg Oral BID Dastan, Krider S, PA-C   1,000 mg at 12/05/20 8676  . lidocaine (LIDODERM) 5 % 1 patch  1 patch Transdermal Q24H Lovorn, Megan, MD   1 patch at 12/04/20 2005  . lidocaine (XYLOCAINE) 2 % jelly 1 application  1 application Urethral PRN Lovorn, Megan, MD      . linezolid (ZYVOX) tablet 600 mg  600 mg Oral Q12H Rosiland Oz, MD   600 mg at 12/05/20 0802  . lipase/protease/amylase (CREON) capsule 24,000 Units  24,000 Units Oral TID Desi, Rowe, PA-C   24,000 Units at 12/05/20 1121  . loratadine (CLARITIN) tablet 10 mg  10 mg Oral Daily Shiraz, Bastyr, PA-C   10 mg at 12/05/20 7209  . MEDLINE mouth rinse  15 mL Mouth Rinse BID Mats, Jeanlouis, PA-C   15 mL at 12/05/20 4709  . methocarbamol (ROBAXIN) tablet 500 mg  500 mg Oral Q12H PRN Raulkar, Clide Deutscher, MD   500 mg at 11/21/20 1029  . metoCLOPramide (REGLAN) tablet 10 mg  10 mg Oral TID AC Lovorn, Megan, MD   10 mg at 12/05/20 1131  . metroNIDAZOLE (FLAGYL) tablet 500 mg  500 mg Oral Q8H Meeker, Ivan Anchors, PA-C   500 mg at 12/05/20 0549  . midodrine (PROAMATINE) tablet 5 mg  5 mg Oral TID WC Lovorn, Megan, MD   5 mg at 12/05/20 1121  . morphine (MS CONTIN) 12 hr tablet 30 mg  30 mg Oral Daily Lovorn, Megan, MD   30 mg at 12/05/20 0802  . ondansetron (ZOFRAN-ODT) disintegrating tablet 4 mg  4 mg Oral Q6H PRN Lovorn, Megan, MD   4 mg at 12/05/20 1030  . pantoprazole (PROTONIX) EC tablet 40 mg  40 mg Oral Q1200 Jerret, Mcbane, PA-C   40 mg at 12/05/20 1120  . polycarbophil (FIBERCON) tablet 625 mg  625 mg Oral BID Lj, Miyamoto, PA-C   625 mg at 12/05/20 0804  . polyethylene glycol (MIRALAX / GLYCOLAX) packet 17 g  17 g Oral Daily PRN Longino, Pamela S, PA-C      . potassium chloride (KLOR-CON) packet 40 mEq  40 mEq Oral BID Izora Ribas, MD   40 mEq at 12/05/20 0803  . prochlorperazine (COMPAZINE) tablet 5-10 mg  5-10  mg Oral Q6H PRN Daequan, Kozma, PA-C   10 mg at 12/03/20 1226   Or  . prochlorperazine (COMPAZINE) injection 5-10 mg  5-10 mg Intramuscular Q6H PRN Mongeau, Pamela S, PA-C       Or  . prochlorperazine (COMPAZINE) suppository 12.5 mg  12.5 mg Rectal Q6H PRN Siddoway, Pamela S, PA-C      . simethicone (MYLICON) chewable tablet 80 mg  80 mg Oral TID WC & HS Delmont, Prosch, PA-C   80 mg at 12/05/20 1121  . sodium phosphate (FLEET) 7-19 GM/118ML enema 1 enema  1 enema Rectal Once PRN Galindez, Pamela S, PA-C      . tamsulosin (FLOMAX) capsule 0.8 mg  0.8 mg Oral QPC supper Hy, Swiatek S, PA-C   0.8 mg at 12/04/20 1750  . traZODone (DESYREL) tablet 50 mg  50 mg Oral QHS PRN Raulkar, Clide Deutscher, MD      .  zinc sulfate capsule 220 mg  220 mg Oral Daily Jevaughn, Degollado, PA-C   220 mg at 12/05/20 5621   Facility-Administered Medications Ordered in Other Encounters  Medication Dose Route Frequency Provider Last Rate Last Admin  . sodium chloride 0.9 % injection 10 mL  10 mL Intravenous PRN Truitt Merle, MD   10 mL at 02/09/17 1111    Allergies as of 11/12/2020  . (No Known Allergies)     Review of Systems:    As per HPI, otherwise negative    Physical Exam:  Vital signs in last 24 hours: Temp:  [97.6 F (36.4 C)-98 F (36.7 C)] 98 F (36.7 C) (05/08 0423) Pulse Rate:  [64-86] 64 (05/08 0423) Resp:  [16-18] 16 (05/08 0423) BP: (107-142)/(64-74) 142/74 (05/08 0423) SpO2:  [95 %-96 %] 95 % (05/08 0423) Weight:  [76 kg] 76 kg (05/08 0422) Last BM Date: 12/02/20 General:   Pleasant male sitting upright in wheelchair in NAD Lungs:  Respirations even and unlabored. Lungs clear to auscultation bilaterally.   No wheezes, crackles, or rhonchi.  Heart:  Regular rate and rhythm; no MRG Abdomen:  Soft, nondistended, nontender. Normal bowel sounds. No appreciable masses or hepatomegaly.  Psych:  Alert and cooperative. Normal affect.  LAB RESULTS: Recent Labs    12/05/20 0530  WBC 6.1  HGB 9.7*  HCT 30.2*   PLT 504*   BMET Recent Labs    12/05/20 0530  NA 133*  K 3.9  CL 102  CO2 27  GLUCOSE 104*  BUN 6*  CREATININE 0.47*  CALCIUM 8.3*   LFT No results for input(s): PROT, ALBUMIN, AST, ALT, ALKPHOS, BILITOT, BILIDIR, IBILI in the last 72 hours. PT/INR No results for input(s): LABPROT, INR in the last 72 hours.  STUDIES: No results found.    Impression / Plan:   1) Nausea/Vomiting - Likely multifactorial from medications, complex hospital course, but thankfully feeling much better today - Continue Reglan scheduled for at least 72 hours.  If still feeling well, can reduce to BID x3 days then convert to as needed - Okay to use Compazine and Zofran prn breakthrough - Please do not hesitate to contact inpatient GI team with any additional questions or concerns.  Gerrit Heck, DO, [facility]    LOS: 23 days   Lavena Bullion  12/05/2020, 2:03 PM

## 2020-12-05 NOTE — Progress Notes (Signed)
Occupational Therapy Session Note  Patient Details  Name: Colton Bush MRN: [id number] Date of Birth: [birth year]  Today's Date: 12/05/2020 OT Individual Time: 0800-0900 OT Individual Time Calculation (min): 60 min    Short Term Goals: Week 3:  OT Short Term Goal 1 (Week 3): LTG=STG 2/2 ELOS  Skilled Therapeutic Interventions/Progress Updates:    Pt resting in bed upon arrival and requesting to use toilet. Supine>sit EOB with supervision. Sit>stand from EOB and amb with RW to bathroom with CGA. Mod A for toileting. Pt returned to room and sat in w/c at sink. UB bathing/dressing seated in w/c at sink-supervision. Sit<>stand from w/c X 5 with supervision. Toilet transfer ([place] over toilet) with supervision. Pt remained in TIS w/c with all needs within reach and belt alarm activated.   Therapy Documentation Precautions:  Precautions Precautions: Fall Restrictions Weight Bearing Restrictions: No RLE Weight Bearing: Weight bearing as tolerated Therapy Vitals Temp: 98.6 F (37 C) Pulse Rate: 71 Resp: 16 BP: (!) 99/54 Patient Position (if appropriate): Sitting Oxygen Therapy SpO2: 94 % O2 Device: Room Air Pain:  Pt denies pain this morning   Therapy/Group: Individual Therapy  [name] 12/05/2020, 2:46 PM

## 2020-12-06 ENCOUNTER — Ambulatory Visit: Payer: Medicare Other | Admitting: Radiation Oncology

## 2020-12-06 ENCOUNTER — Inpatient Hospital Stay: Payer: Self-pay

## 2020-12-06 LAB — COMPREHENSIVE METABOLIC PANEL
ALT: 15 U/L (ref 0–44)
AST: 28 U/L (ref 15–41)
Albumin: 2.5 g/dL — ABNORMAL LOW (ref 3.5–5.0)
Alkaline Phosphatase: 772 U/L — ABNORMAL HIGH (ref 38–126)
Anion gap: 6 (ref 5–15)
BUN: 7 mg/dL — ABNORMAL LOW (ref 8–23)
CO2: 29 mmol/L (ref 22–32)
Calcium: 8.7 mg/dL — ABNORMAL LOW (ref 8.9–10.3)
Chloride: 100 mmol/L (ref 98–111)
Creatinine, Ser: 0.5 mg/dL — ABNORMAL LOW (ref 0.61–1.24)
GFR, Estimated: >60 mL/min (ref >60)
Glucose, Bld: 153 mg/dL — ABNORMAL HIGH (ref 70–99)
Potassium: 4.5 mmol/L (ref 3.5–5.1)
Sodium: 135 mmol/L (ref 135–145)
Total Bilirubin: 0.3 mg/dL (ref 0.3–1.2)
Total Protein: 6.9 g/dL (ref 6.5–8.1)

## 2020-12-06 LAB — CBC WITH DIFFERENTIAL/PLATELET
Abs Immature Granulocytes: 0.02 10*3/uL (ref 0.00–0.07)
Basophils Absolute: 0.1 10*3/uL (ref 0.0–0.1)
Basophils Relative: 1 %
Eosinophils Absolute: 0.1 10*3/uL (ref 0.0–0.5)
Eosinophils Relative: 1 %
HCT: 30.8 % — ABNORMAL LOW (ref 39.0–52.0)
Hemoglobin: 10 g/dL — ABNORMAL LOW (ref 13.0–17.0)
Immature Granulocytes: 0 %
Lymphocytes Relative: 19 %
Lymphs Abs: 1.4 10*3/uL (ref 0.7–4.0)
MCH: 29.1 pg (ref 26.0–34.0)
MCHC: 32.5 g/dL (ref 30.0–36.0)
MCV: 89.5 fL (ref 80.0–100.0)
Monocytes Absolute: 0.7 10*3/uL (ref 0.1–1.0)
Monocytes Relative: 10 %
Neutro Abs: 4.8 10*3/uL (ref 1.7–7.7)
Neutrophils Relative %: 69 %
Platelets: 491 10*3/uL — ABNORMAL HIGH (ref 150–400)
RBC: 3.44 MIL/uL — ABNORMAL LOW (ref 4.22–5.81)
RDW: 17 % — ABNORMAL HIGH (ref 11.5–15.5)
WBC: 7.2 10*3/uL (ref 4.0–10.5)
nRBC: 0 % (ref 0.0–0.2)

## 2020-12-06 LAB — GLUCOSE, CAPILLARY
Glucose-Capillary: 164 mg/dL — ABNORMAL HIGH (ref 70–99)
Glucose-Capillary: 246 mg/dL — ABNORMAL HIGH (ref 70–99)
Glucose-Capillary: 239 mg/dL — ABNORMAL HIGH (ref 70–99)
Glucose-Capillary: 143 mg/dL — ABNORMAL HIGH (ref 70–99)

## 2020-12-06 LAB — C-REACTIVE PROTEIN: CRP: 0.6 mg/dL (ref <1.0)

## 2020-12-06 LAB — SEDIMENTATION RATE: Sed Rate: 58 mm/hr — ABNORMAL HIGH (ref 0–16)

## 2020-12-06 MED ORDER — CEFTRIAXONE IV (FOR PTA / DISCHARGE USE ONLY): 2.0000 g | Freq: Two times a day (BID) | INTRAVENOUS | 0 refills | Status: DC

## 2020-12-06 MED ORDER — METRONIDAZOLE 500 MG PO TABS: 500.0000 mg | ORAL_TABLET | Freq: Three times a day (TID) | ORAL | 1 refills | Status: DC

## 2020-12-06 MED ORDER — POTASSIUM CHLORIDE 20 MEQ PO PACK
40.0000 meq | PACK | Freq: Every day | ORAL | Status: DC
  Administered 2020-12-07 – 2020-12-08 (×2): 40 meq via ORAL
  Filled 2020-12-06 (×2): qty 2

## 2020-12-06 NOTE — Progress Notes (Signed)
Speech Language Pathology Daily Session Note  Patient Details  Name: Colton Bush MRN: [id number] Date of Birth: 06/09/1956  Today's Date: 12/06/2020 SLP Individual Time: 1131-1200 SLP Individual Time Calculation (min): 29 min  Short Term Goals: Week 4: SLP Short Term Goal 1 (Week 4): STG=LTG (anticipated discharge 5/11)  Skilled Therapeutic Interventions:Skilled ST services focused on education and cognitive skills. Pt demonstrated improved anticipator awareness of deficit's impact on daily function, list activities that are safe to participate in at home (model car building) and the set up to consider endurance. Pt's wife arrived for education the last 10 minutes, SLP provided education pertaining to deficits in mildly complex problem solving, anticipatory awareness, short term recall and selective attention. Handout was given pertaining to memory strategies. SLP educated wife on need to provided supervision A on medication/money/time management and ways to support short term recall within tasks. All questions answered to satisfaction, education is complete. Pt was left in room with wife, call bell within reach and chair alarm set. SLP recommends to continue skilled services.      Pain Pain Assessment Pain Score: 0-No pain  Therapy/Group: Individual Therapy  [name]  [facility] 12/06/2020, 2:29 PM

## 2020-12-06 NOTE — Progress Notes (Signed)
Patient ID: Colton Bush, male   DOB: 1956-03-29, 65 y.o.   MRN: [id number]  Per therapy team, pt will need w/c at discharge,and wife did not come in for family edu today.   SW met with pt and [name] to discuss above. Wife forgot about appointment. Pam Chandler/[facility] reviewing iv abx education at time of visit. Wife intends to be here tomorrow for scheduled therapy sessions at 10am and 11:30am. SW informed on w/c to be ordered and SW will follow-up about any further DME.   SW ordered w/c with [employer] via parachute.   Loralee Pacas, MSW, [place] Office: [phone] Cell: [phone] Fax: [phone]

## 2020-12-06 NOTE — Progress Notes (Signed)
Physical Therapy Session Note  Patient Details  Name: Colton Bush MRN: [id number] Date of Birth: [birth year]  Today's Date: 12/06/2020 PT Individual Time: 0800-0910; 1045-1130 PT Individual Time Calculation (min): 70 min and 45 min  Short Term Goals: Week 3:  PT Short Term Goal 1 (Week 3): Pt will complete least restrictive transfer with min A consistently PT Short Term Goal 2 (Week 3): Pt will ambulate x 50 ft with LRAD and assist x 1 PT Short Term Goal 3 (Week 3): Pt will initiate stair training  Skilled Therapeutic Interventions/Progress Updates:    Session 1: Pt received supine in bed, agreeable to PT session. No complaints of pain at rest, has onset of B knee pain with mobility. Pt able to receive pain medication and and Voltarin gel to B knees at beginning of session. Supine BP 155/88. Supine to sit with Supervision with use of bedrail. Seated BP 116/75. Sit to stand with min A to RW. Pt unable to remain standing in order to obtain BP due to dizziness. Assisted pt with donning thigh-high TEDs. Pt continues to report dizziness in standing and unable to tolerate remaining standing. Stand pivot transfer to w/c with RW and min A. Manual w/c propulsion 2 x 100 ft with use of BUE at Supervision. Attempt to have pt perform gait training with RW, in standing reports he feels that he is "going to collapse", deferred further standing activity this session. Vitals stable in seated position with no complaints of dizziness. Discussed that pt will need to use a w/c when he goes home due to poor standing tolerance and only able to ambulate about 25 ft at most at this time. Pt will be able to be more independent with mobility with use of manual wheelchair as well as have improved safety. Pt left seated in TIS chair in room with needs in reach, quick release belt and chair alarm in place at end of session.  Session 2: Pt received seated in TIS chair in room, agreeable to PT session. Pt's wife not present for  scheduled family education session. No complaints of pain at rest, has onset of B knee pain with mobility that improves at rest. Stand pivot transfer to regular w/c with min A and RW. Manual w/c propulsion x 200 ft with use of BUE at Supervision level for global endurance training. Stand pivot transfer to Nustep with RW and min A. Pt has onset of dizziness in standing that improves at rest. Nustep level 4 x 10 min with use of B UE/LE for global endurance training. Stand pivot transfer back to w/c. Pt reports some dizziness in standing, able to perform standing alt L/R marches x 10 reps before requesting to sit due to symptoms. Symptoms subside once seated. Pt left seated in TIS chair in room with needs in reach, quick release belt and chair alarm in place at end of session.  Therapy Documentation Precautions:  Precautions Precautions: Fall Restrictions Weight Bearing Restrictions: No RLE Weight Bearing: Weight bearing as tolerated   Therapy/Group: Individual Therapy   Excell Seltzer, PT, DPT, CSRS  12/06/2020, 12:26 PM

## 2020-12-06 NOTE — Progress Notes (Signed)
Occupational Therapy Session Note  Patient Details  Name: JERMALL ISAACSON MRN: [id number] Date of Birth: 02/04/1956  Today's Date: 12/06/2020 OT Individual Time: 1000-1045 OT Individual Time Calculation (min): 45 min    Short Term Goals: Week 3:  OT Short Term Goal 1 (Week 3): LTG=STG 2/2 ELOS  Skilled Therapeutic Interventions/Progress Updates:    Pt resting in w/c upon arrival. Wife not present for scheduled education. Pt c/o increased Bil knee pain with standing. Pt unable to stand for longer then a few seconds 2/2 orthostasis. BP seated 142/86. Pt unable to stand long enough for BP reading but 109/76 immediately upon sitting back in w/c. Abdominal binder ordered but not in room yet. Discussed w/c use in community and use of BSC over toilet. Pt remained in w/c with all needs within reach. Belt alarm activated.  Therapy Documentation Precautions:  Precautions Precautions: Fall Restrictions Weight Bearing Restrictions: No RLE Weight Bearing: Weight bearing as tolerated  Pain:  Pt c/o increase Bil knee pain with standing; repositioned   Therapy/Group: Individual Therapy  [name] 12/06/2020, 12:27 PM

## 2020-12-06 NOTE — Progress Notes (Signed)
PROGRESS NOTE   Subjective: Patient's chart reviewed- No issues reported overnight SBP elevated, vitals signs otherwise stable Hgb improved to 10. Lovena Le mentions he is still dizzy with therapy- discussed adding abdominal binder and ordered  ROS:  Pt denies SOB, abd pain, CP, N/V/C/D, and vision changes   Objective:   No results found. Recent Labs    12/05/20 0530 12/06/20 0500  WBC 6.1 7.2  HGB 9.7* 10.0*  HCT 30.2* 30.8*  PLT 504* 491*   Recent Labs    12/05/20 0530  NA 133*  K 3.9  CL 102  CO2 27  GLUCOSE 104*  BUN 6*  CREATININE 0.47*  CALCIUM 8.3*    Intake/Output Summary (Last 24 hours) at 12/06/2020 0603 Last data filed at 12/06/2020 0443 Gross per 24 hour  Intake 920 ml  Output 1965 ml  Net -1045 ml     Pressure Injury 11/07/20 Heel Right Stage 1 -  Intact skin with non-blanchable redness of a localized area usually over a bony prominence. (Active)  11/07/20 0800  Location: Heel  Location Orientation: Right  Staging: Stage 1 -  Intact skin with non-blanchable redness of a localized area usually over a bony prominence.  Wound Description (Comments):   Present on Admission:      Pressure Injury 11/07/20 Heel Right Deep Tissue Pressure Injury - Purple or maroon localized area of discolored intact skin or blood-filled blister due to damage of underlying soft tissue from pressure and/or shear. (Active)  11/07/20 2000  Location: Heel  Location Orientation: Right  Staging: Deep Tissue Pressure Injury - Purple or maroon localized area of discolored intact skin or blood-filled blister due to damage of underlying soft tissue from pressure and/or shear.  Wound Description (Comments):   Present on Admission:     Physical Exam: Vital Signs Blood pressure (!) 150/78, pulse 77, temperature 97.6 F (36.4 C), resp. rate 17, height 5' 11"  (1.803 m), weight 76 kg, SpO2 95 %.  Gen: no distress, normal  appearing HEENT: oral mucosa pink and moist, NCAT Cardio: Reg rate Chest: normal effort, normal rate of breathing Abd: soft, non-distended Ext: no edema Psychiatric: less depressed Neurological: delayed responses  Musculoskeletal:  Cervical back: Normal range of motionand neck supple.  Comments: RLE staples out.   UE's 5-/5 but equal B/L  LE's 4+/5 in HF, KE, DF and PF B/L  Skin: General: Skin is warmand dry. Low back B/L has excoriations from scratching and red/slightly excoriated L hamstring area.  Comments: RLE incisions as well as abd incisions already noted- staples out.  Intact to light touch in all 4 extremities     Assessment/Plan: 1. Functional deficits which require 3+ hours per day of interdisciplinary therapy in a comprehensive inpatient rehab setting.  Physiatrist is providing close team supervision and 24 hour management of active medical problems listed below.  Physiatrist and rehab team continue to assess barriers to discharge/monitor patient progress toward functional and medical goals  Care Tool:  Bathing    Body parts bathed by patient: Right arm,Left arm,Chest,Abdomen,Front perineal area,Right upper leg,Left upper leg,Left lower leg,Face   Body parts bathed by helper: Buttocks,Right lower leg     Bathing assist  Assist Level: Minimal Assistance - Patient > 75%     Upper Body Dressing/Undressing Upper body dressing   What is the patient wearing?: Pull over shirt    Upper body assist Assist Level: Set up assist    Lower Body Dressing/Undressing Lower body dressing      What is the patient wearing?: Pants     Lower body assist Assist for lower body dressing: Minimal Assistance - Patient > 75%     Toileting Toileting    Toileting assist Assist for toileting: Maximal Assistance - Patient 25 - 49%     Transfers Chair/bed transfer  Transfers assist     Chair/bed transfer assist level: Minimal Assistance - Patient > 75%      Locomotion Ambulation   Ambulation assist   Ambulation activity did not occur: Safety/medical concerns  Assist level: 2 helpers Assistive device: Walker-rolling Max distance: 25'   Walk 10 feet activity   Assist  Walk 10 feet activity did not occur: Safety/medical concerns  Assist level: 2 helpers Assistive device: Walker-rolling   Walk 50 feet activity   Assist Walk 50 feet with 2 turns activity did not occur: Safety/medical concerns         Walk 150 feet activity   Assist Walk 150 feet activity did not occur: Safety/medical concerns         Walk 10 feet on uneven surface  activity   Assist Walk 10 feet on uneven surfaces activity did not occur: Safety/medical concerns         Wheelchair     Assist Will patient use wheelchair at discharge?: Yes Type of Wheelchair: Manual Wheelchair activity did not occur: Safety/medical concerns  Wheelchair assist level: Supervision/Verbal cueing Max wheelchair distance: 100'    Wheelchair 50 feet with 2 turns activity    Assist    Wheelchair 50 feet with 2 turns activity did not occur: Safety/medical concerns   Assist Level: Supervision/Verbal cueing   Wheelchair 150 feet activity     Assist  Wheelchair 150 feet activity did not occur: Safety/medical concerns       Blood pressure (!) 150/78, pulse 77, temperature 97.6 F (36.4 C), resp. rate 17, height 5' 11"  (1.803 m), weight 76 kg, SpO2 95 %.  Medical Problem List and Plan: 1.L frontal craniotomysecondary to LDH/SAH from fall -patient may Shower if incisions covered -ELOS/Goals: 2-3 weeks- mod I to supervision          -continue PT and OT- explained to pt, they are concerned about "early d/c"_ that if he hasn't made enough gains, can prolong stay.   5/9- continue PT and OT  2. Impaired mobility: -DVT/anticoagulation: Pharmaceutical: Continue Lovenox -antiplatelet therapy: N/a 3. Low back  pain: continue air mattress. Resolved. Dicussed XR findings of degeneration.   4/25- was having back pain- and R knee pain- if doesn't improve, will schedule Am pain meds  5/2- pain doing OK- better in knees especially- con't regimen  5/6- pain was worse in R knee this AM- buckled slightly when on toilet- con't pain regimen for now  5/9- pain controlled- continue regimen 4. Mood: LCSW to follow for evaluation and support.  5/5- starting Celexa 20 mg daily- doesn't feel theres "any reason to live".    5/6- pt happy that "he's not the only one" with depression in this situation- OK with Celexa- might need Remeron if appetite doesn't pick up?  5/7- still very depressed- takes a few days to kick in- pt very frustrated- will monitor  5/9- pt feeling better today- continue regimen -antipsychotic agents: N/A 5. Neuropsych: This patient is capable of making decisions on his own behalf. 6. Multiple deep tissue injuries/Skin/Wound Care: Continue Air mattress overlay with pressure relief measures.  --Continue vitamins and collagen to promote wound healing  7. Fluids/Electrolytes/Nutrition: Monitor I/O. Check lytes in am. 8. SDH (concerns of bacterial infection): S/p Burr hole evacuation  9. Strep anginosis bacteremia: Continue Ceftriaxone/Metronidazole X 6 weeks w/end date  10 Liver abscess s/p JP drain placement: Continue flushes TID and document output.   -IR is following.  4/28- getting CT scan on abd/pelvis- with contrast- this AM- pending results-   4/29- looks a little better- con't IV ABX-   5/2- JP drain removed!- con't wound care daily and prn 11. New onset seizures: Continue Keppra bid till follow up with neuro on outpatient basis.  12. Left hip ORIF: WBAT. Will consult ortho to eval given worsening pain. Added MS contin for better pain control/participation with therapy. Appears to have helped, continue.   4/26- will remove staples 13. T2DM: Hgb A1C- 10.8. Will  monitor BS ac/hs. Home regimen Levemir 10u am/15u pm with SSI.  --continue Levemir 13u bid with SSI and titrate insulin as indicated.             -4/23 improved control  4/25- BGs 68-256 in last 24 hours- much more labile today- if still an issue tomorrow, will ask DM coordinators.   5/7- BGs doing overall better, because not eating- will wait to call Dm coordinator  5/9-  BGs much better- continue reigmen 14. BPH/Urinary retention: Foley replaced--keep in place for decompression for few days then start bladder program next week?.  --will increase flomax to 0.8 mg/HS  5/3- is still being cathed- has not been able to void- will check with family if want him to go home with foley vs in/out caths-     5/5- will place foley- have nurse let me know when to place. Will go home with it.    5/6- will make f/u with Urology- also continue Flomax since might void in future.  15. Pancreatic cancer with recurrence: Continue Creon with meals. --follow up with Hem/onc and Radiation Onc after discharge.  -wife asking about plans for radiation/chemo: will contact oncology to ask them to discuss with wife.  4/28- to start radiation 11/30/20 -is set up. At 1:30pm  5/2- asked SW to make sure therapy doesn't schedule during radiation- or afterwards  5/2- radiation got pushed to next week- will see if needs to do before d/c.  5/5- let them know/radiation that has d/c next week.  5/6- pushed radiation back.    5/8- weight down even more- will discuss starting supplements.  16. Low back pain/muscle spasms- will start Robaxin prn and lidoderm patches 1- 8pm to 8am.              4/16: denies pain: decrease robaxin to q12H prn.  17. Hypokalemia: scheduled supplement and repeat BMP Monday 18. Overweight BMI 25.52: provide counseling.  19. Elevated alk phos: increasing,  Oncology notified. Oncology has discussed plans with wife for post- CIR follow-up.  20. Right sided pleural  effusion: could be contributing to his chest pain. Consulted IR regarding drainage/pleural studies given active cancer. ADDENDUM: s/p successful thoracentesis  -pt denies any SOB  4/25- VRE grew from Pleural effusion- ID to see today- linezolid started.    4/26- seen by ID- will con't Linizolid  4/28- will check CMP/CBC/CRP/ESR qMonday.  4/29-  Pleural  abscess has decreased in size per CT- con't ABX   5/2- WBC down to 6.7- con't regimen  5/9- don't know when IV ABX to stop- might need PICC  5/9: WBC normal- will check regarding abx duration 21. Hypertension with hypotension: continue to monitor BP TID  5/3- BP well controlled- con't regimen  5/4- had orthostatic hypotension yesterday- encouraged pt to drink (not drinking enough) and to take Midodrine 5 mg TID prn for low BP. n  5/6- had another episode of hypotension yesterday- will schedule 2.5 mg TID Midodrine- since never know when it will occur- also encouraged PO/liquid intake.   5/8- increased midodrine to 5 mg TID.   5/9: BP labile- continue midodrine, add abdominal binder with therapy 22. Loose stools in setting of previous constipation  5/5- had 3 loose BMs this AM- if continues, might need to check C Diff vs imodium? Will monitor  5/6- better- con't to monitor 23. Nausea/Vomiting  5/7- getting worse per pt- and "losing weight" per pt- will order daily weights; and called GI- they suggested reglan 10 mg TID for now and will see him when possible.   5/8- nausea resolved today- con't reglan for 3 days.     LOS: 24 days A FACE TO [place] Akiera Allbaugh 12/06/2020, 6:03 AM

## 2020-12-06 NOTE — Progress Notes (Signed)
Arrived to discuss PICC placement with patient. Patient requested waiting until 5-10 AM for placement. Primary RN notified.

## 2020-12-07 LAB — GLUCOSE, CAPILLARY
Glucose-Capillary: 183 mg/dL — ABNORMAL HIGH (ref 70–99)
Glucose-Capillary: 284 mg/dL — ABNORMAL HIGH (ref 70–99)
Glucose-Capillary: 259 mg/dL — ABNORMAL HIGH (ref 70–99)
Glucose-Capillary: 94 mg/dL (ref 70–99)

## 2020-12-07 MED ORDER — INSULIN ASPART PROT & ASPART (70-30 MIX) 100 UNIT/ML ~~LOC~~ SUSP
19.0000 [IU] | Freq: Two times a day (BID) | SUBCUTANEOUS | Status: DC
  Administered 2020-12-07 – 2020-12-08 (×2): 19 [IU] via SUBCUTANEOUS

## 2020-12-07 MED ORDER — SODIUM CHLORIDE 0.9% FLUSH: 10.0000 mL | INTRAVENOUS | Status: DC | PRN

## 2020-12-07 MED ORDER — GLUCERNA SHAKE PO LIQD
237.0000 mL | Freq: Three times a day (TID) | ORAL | Status: DC
  Administered 2020-12-07 – 2020-12-08 (×4): 237 mL via ORAL
  Filled 2020-12-07: qty 237

## 2020-12-07 NOTE — Progress Notes (Signed)
Occupational Therapy Session Note  Patient Details  Name: Colton Bush MRN: [id number] Date of Birth: 1955/09/04  Today's Date: 12/07/2020 OT Individual Time: 1002-1055 OT Individual Time Calculation (min): 53 min    Short Term Goals: Week 2:  OT Short Term Goal 1 (Week 2): P twill transfer with +1 A and LRAD ot BSC OT Short Term Goal 1 - Progress (Week 2): Met OT Short Term Goal 2 (Week 2): Pt will sit to stand consistently with MOD A in prep for clothing managment OT Short Term Goal 2 - Progress (Week 2): Met OT Short Term Goal 3 (Week 2): Pt will groom seated EOB with S OT Short Term Goal 3 - Progress (Week 2): Met Week 3:  OT Short Term Goal 1 (Week 3): LTG=STG 2/2 ELOS Week 4:  OT Short Term Goal 1 (Week 4): LTG=STG 2/2 ELOS  Skilled Therapeutic Interventions/Progress Updates:    Pt greeted at time of session semireclined in bed resting with wife present who remained throughout for family education. Focus of session on functional mobiltiy, transfers, ADL training, standing balance, and how to assist the pt. Supine > sit Supervision from flat bed, no dizziness and already had TEDS on from previous session. Stand pivot bed > new manual wheelchair (delivered at beginning of session) with Min A for sit > stand, wife ed on gait belt usage. Wife education provided as well for wheelchair transport and folding, removing leg rests, and putting back on having her perform x2. When pt performed stand pivot, urine leaking from new leg bag, RN aware and came into room to fix valve. TEDS and pants soiled with urine, doffed at sit <> stand level and wife providing assist for donning new TEDS and pants with total A for TEDS and Mod A for pants to thread. Education as well for Manufacturing engineer and education for how to thread Foley through pants if he has regular bag as well. Pt up in TIS with alarm on call bell in reach.   Therapy Documentation Precautions:  Precautions Precautions:  Fall Restrictions Weight Bearing Restrictions: No RLE Weight Bearing: Weight bearing as tolerated     Therapy/Group: Individual Therapy  Viona Gilmore 12/07/2020, 7:21 AM

## 2020-12-07 NOTE — Progress Notes (Signed)
Speech Language Pathology Discharge Summary  Patient Details  Name: Colton Bush MRN: [id number] Date of Birth: 1956-03-08  Today's Date: 12/07/2020 SLP Individual Time: 7622-6333 SLP Individual Time Calculation (min): 43 min   Skilled Therapeutic Interventions:   Skilled ST services focused on education and cognitive skills. SLP facilitated reassessment of cognitive skills with Cognistat, pt demonstrated WFL on all subtext except for short term recall (9) indicating mild deficits. Pt demonstrated improvement in error awareness, mildly complex problem solving and processing speed. SLP provided education pertaining to use of taught strategies in recall and all questions answered to satisfaction. Pt was left in room with call bell within reach and chair alarm set.     Patient has met 5 of 5 long term goals.  Patient to discharge at overall Supervision level.  Reasons goals not met:     Clinical Impression/Discharge Summary:   Pt made great progress met 5 out 5 goals, discharging at supervision A. Pt demonstrated functional improvement in medication/time/money management tasks, increase anticipatory awareness/impact of acute deficits on daily function, short term recall and selective attention. Pt demonstrate improvement in formal assessment with only mild deficits in memory on Cognistat compared to on evaluation noting deficits in recall, mildly complex problem solving and judgement. SLP provided education including handout pertaining to memory strategies and need for assistance on higher level problem solving tasks, with pt and pt's wife. Pt benefited from skilled ST services in order to maximize functional independence and reduce burden of care, requiring 24 hour supervision at discharge with continued skilled ST services.   Care Partner:  Caregiver Able to Provide Assistance: Yes  Type of Caregiver Assistance: Physical;Cognitive  Recommendation:  Home Health SLP;Outpatient SLP;24 hour  supervision/assistance  Rationale for SLP Follow Up: Maximize cognitive function and independence;Reduce caregiver burden   Equipment: N/A   Reasons for discharge: Discharged from hospital   Patient/Family Agrees with Progress Made and Goals Achieved: Yes    Zianna Dercole  [facility] 12/07/2020, 9:14 AM

## 2020-12-07 NOTE — Progress Notes (Signed)
Patient ID: Colton Bush, male   DOB: 1956/05/14, 65 y.o.   MRN: [id number]  SW met with pt and pt wife in room to review discharge. Pt is ambulance transport home tomorrow. SW discussed PTAR pick up at 11am to allow IV abx administration in the morning.   SW scheduled ambulance pick up at 11am with PTAR [phone]).   SW updated medical team on above.   SW confirmed with [facility] and Pam [facility] that nursing will be at the home on Thursday between 8am-9am. Pam/[facility] requested one more session with pt wife for education on how to administer IVabx. SW to follow-up with wife as well about this.   [place] called and left pt wife [name] messages on home [phone]) and cell phone Marland [phone]) if she can be here tomorrow at 8am for a session.   [place] called pt wife again and went to VM.   Loralee Pacas, MSW, [place] Office: [phone] Cell: [phone] Fax: [phone]

## 2020-12-07 NOTE — Progress Notes (Signed)
Occupational Therapy Discharge Summary  Patient Details  Name: Colton Bush MRN: [id number] Date of Birth: 1956/07/10  Patient has met 49 of 12 long term goals due to improved activity tolerance, improved balance, postural control, ability to compensate for deficits, improved awareness and improved coordination. Pt progress was inconsistent during this admission. Pt with orthostatic BP which has been sporadically limiting. Pt has [place] and Abdominal Binder. Pt requires CGA for bathing, functional transfers, and supervision for UB bathing/dressing. Pt requires min A for LB dressing and management of foley bag. Toileting with CGA.  Pt's wife has been present and participated in therapies. Patient to discharge at [facility] Assist level.  Patient's care partner is independent to provide the necessary physical and cognitive assistance at discharge.    Reasons goals not met: Pt did not meet LB dressing goal as he needs Min A to complete.  Recommendation:  Patient will benefit from ongoing skilled OT services in home health setting to continue to advance functional skills in the area of BADL and Reduce care partner burden.  Equipment: No equipment provided  Reasons for discharge: treatment goals met and discharge from hospital  Patient/family agrees with progress made and goals achieved: Yes  OT Discharge ADL ADL Grooming: Minimal assistance Where Assessed-Grooming: Edge of bed Upper Body Bathing: Minimal assistance Where Assessed-Upper Body Bathing:  (bsc) Lower Body Bathing: Maximal assistance (bsc) Upper Body Dressing: Moderate assistance Where Assessed-Upper Body Dressing:  (bsc) Lower Body Dressing: Dependent Where Assessed-Lower Body Dressing:  (BSC iwth stedy) Toileting: Dependent (stedy) Toilet Transfer: Dependent (stedy- MOD-MAX STS) Vision Baseline Vision/History: Wears glasses Wears Glasses: Reading only Patient Visual Report: No change from baseline Vision  Assessment?: No apparent visual deficits Perception  Perception: Within Functional Limits Praxis Praxis: Intact Cognition Overall Cognitive Status: Impaired/Different from baseline Arousal/Alertness: Awake/alert Orientation Level: Oriented X4 Attention: Selective Selective Attention: Impaired Selective Attention Impairment: Verbal complex;Functional complex Memory: Impaired Memory Impairment: Storage deficit;Retrieval deficit Awareness: Impaired Awareness Impairment: Anticipatory impairment Problem Solving: Impaired Problem Solving Impairment: Verbal complex;Functional complex Safety/Judgment: Impaired Sensation Sensation Light Touch: Appears Intact Hot/Cold: Appears Intact Proprioception: Appears Intact Stereognosis: Not tested Motor  Motor Motor: Abnormal postural alignment and control Motor - Skilled Clinical Observations: global weakness impacting alignement    Trunk/Postural Assessment  Cervical Assessment Cervical Assessment:  (forward head) Thoracic Assessment Thoracic Assessment:  (rounded shoulders) Lumbar Assessment Lumbar Assessment:  (posterior pelvic tilt)  Balance Static Sitting Balance Static Sitting - Level of Assistance: 6: Modified independent (Device/Increase time) Dynamic Sitting Balance Dynamic Sitting - Balance Support: During functional activity Dynamic Sitting - Level of Assistance: 5: Stand by assistance Extremity/Trunk Assessment RUE Assessment RUE Assessment: Within Functional Limits LUE Assessment LUE Assessment: Within Functional Limits   [name] 12/07/2020, 11:10 AM

## 2020-12-07 NOTE — Consult Note (Signed)
Neuropsychological Consultation   Patient:   Colton Bush   DOB:   04/15/1956  MR Number:  [id number]  Location:  [facility] [address] [facility] [place] [id number] [name] [address] Dept: [place]: [phone]           Date of Service:   12/07/2020  Start Time:   1 PM End Time:   2 PM  Provider/Observer:  Ilean Skill, Psy.D.       Clinical Neuropsychologist       Billing Code/Service: 96158/96159  Chief Complaint:    Colton Bush is a 65 year old male with a history of type 2 diabetes, hypertension, previous spinal cord injury, pancreatic cancer with recurrence and recent admission on 10/18/2020 for right hip fracture complicated by UTI/strep bacteremia and urinary retention.  Patient was discharged to [facility] skilled nursing for rehab.  Patient was readmitted from SNF on 11/05/2020 with SIRS and was found to have multiple liver abscesses.  Patient underwent intervention and revealed strep infection and urine culture positive for E. coli.  Patient continued to have issues with confusion and MRI done revealing suspected areas of acute ischemia in right temporal and parietal lobe regions but with diffusion changes.  CT head revealed late subacute or chronic subdural hematoma measuring 10 mm in thickness.  On 4/11 he developed seizure and was loaded with Keppra.  Patient was taken to the OR on 11/09/2020 for bur hole evacuation.  His antibiotics were changed on 4/12 as liver aspiration was positive for Klebsiella and E. coli and concerns of infected subdural hematoma.  Postoperatively, the patient had significant improvement in speech and cognition with continued current dose of Keppra.  Patient had ongoing difficulties with functioning recommended for CIR due to functional deficits.  Reason for Service:  Patient was referred for neuropsychological consultation due to coping and adjustment in ongoing cognitive  changes that are significantly improving.  Below is the HPI for the current admission.  HPI: Colton Bush. [name] is a 65 year old male with history of T2DM (Dr. Bubba Camp), HTN, SCI, pancreatic cancer with recurrence and recent admission 10/18/20 for right hip fracture s/p ORIF complicated by UTI/strep bacteremia and urinary retention who was d/c to [facility] for rehab. He was readmitted from SNF on 11/05/20 with SIRS and was found to have multiple liver abscesses.  He underwent IR guided aspiration with drain placement revealing strep anginagnosis and Urine culture positive for E coli. Unasyn was recommended by Dr. Tommy Medal.  He continued to have issues with confusion and MRI brain done revealing suspected areas of acute ischemia in left temporal and parietal lobe but with diffusion changes.  CT head revealed late subacute or chronic SDH measuring 10 mm in thickness.  On 04/11, he developed seizures and was loaded with Keppra.  Swallow evaluation done revealing mild to moderate oropharyngeal dysphagia in part due to cognition and aspiration precautions recommended with  Dr. Venetia Constable was consulted for input due to concerns of subdural hemorrhage versus empyema and he was taken to the OR on 04/12 for bur hole evacuation. His antibiotics were changed to metronidazole and ceftriaxone on 04/12 as liver aspiration positive for Klebsiella and E. Coli and concerns of infected SDH. Postop has had significant improvement in speech and mentation and Dr. Leonel Ramsay recommends continuing current dose Keppra and following up with neurology on outpatient basis for wean. Foley was removed this am but he had problems with retention and was cathed for  1300 cc this am therefore foley was replaced. Therapy ongoing and patient continues to be limited  CIR recommended due to functional deficits.  Current Status:  Today was a follow-up visit.  The patient is being discharged tomorrow.  He was alert and oriented and sitting slightly  reclined in his wheelchair.  The patient discussed some of the obstacles that he will face with his discharge tomorrow including the reaction his wife has had various aspects of his return home that she will have to aid with.  1 particular issue that she has been very distressed about is his catheter collection bag and that she is very distressed by the possibility that he could be seen from his wheelchair by others.  The patient does not appear to be distressed by this.  The patient reports that his hip is doing well without significant stress but does continue to have pain in his knee.  Patient denies any significant depression or anxiety.  Behavioral Observation: Colton Bush  presents as a 65 y.o.-year-old Right Caucasian Male who appeared his stated age. his dress was Appropriate and he was Well Groomed and his manners were Appropriate to the situation.  his participation was indicative of Appropriate and Redirectable behaviors.  There were physical disabilities noted.  he displayed an appropriate level of cooperation and motivation.     Interactions:    Active Appropriate  Attention:   abnormal and attention span appeared shorter than expected for age  Memory:   within normal limits; recent and remote memory intact  Visuo-spatial:  not examined  Speech (Volume):  low  Speech:   normal; slowed response  Thought Process:  Coherent and Relevant  Though Content:  WNL; not suicidal and not homicidal  Orientation:   person, place, time/date and situation  Judgment:   Fair  Planning:   Fair  Affect:    Lethargic  Mood:    Dysphoric  Insight:   Fair  Intelligence:   normal   Medical History:   Past Medical History:  Diagnosis Date  . Arthritis    left hand  . Bronchitis 1977  . Cancer ([place]) 03/09/2016   pancreatic cancer, sees Dr. Cristino Martes at [facility]   . Depression    takes Cymbalta daily  . Diabetes mellitus type II    sees Dr. Chalmers Cater   . GERD (gastroesophageal reflux  disease)    takes Omeprazole daily  . H/O hiatal hernia   . Hyperlipidemia    takes Zocor daily  . Hypertension    takes Amlodipine daily  . Hypoglycemia 06/18/2017  . Neck pain    C4-7 stenosis and herniated disc  . Neuromuscular disorder ([place])    hiatal hernia  . Scoliosis    slight  . Spinal cord injury, C5-C7 ([place])    c4-c7  . Stiffness of hand joint    d/t cervical issues         Patient Active Problem List   Diagnosis Date Noted  . Nausea & vomiting   . Pain   . Bladder outlet obstruction 11/12/2020  . Seizure in setting of subdural hematoma 11/12/2020  . Toxic encephalopathy 11/12/2020  . Atrophic pancreas 11/12/2020  . Subdural hematoma ([place])   . Pneumonia of both lower lobes due to infectious organism 11/06/2020  . Liver abscess 11/06/2020  . Sepsis ([place]) 11/05/2020  . Uncontrolled type 2 diabetes mellitus with ketoacidosis without coma, with long-term current use of insulin ([place]) 11/05/2020  . Increased anion gap metabolic acidosis  11/05/2020  . Chronic diastolic CHF (congestive heart failure) ([place]) 11/05/2020  . Closed right hip fracture, initial encounter ([place]) 10/18/2020  . Fall from ground level 10/18/2020  . Hypoglycemia 06/19/2017  . Hypothermia 06/19/2017  . Goals of care, counseling/discussion 09/29/2016  . Port catheter in place 04/11/2016  . Hypercalcemia 03/29/2016  . Adenocarcinoma of head of pancreas ([place]) 03/17/2016  . Biliary obstruction   . Obstructive jaundice due to malignant neoplasm ([place]) 02/27/2016  . DKA (diabetic ketoacidosis) ([place]) 02/27/2016  . Mixed diabetic hyperlipidemia associated with type 2 diabetes mellitus ([place]) 05/05/2014  . Cervical spondylosis with myelopathy 08/18/2011  . CERUMEN IMPACTION 12/14/2008  . Hyperlipidemia, mixed 09/16/2007  . Essential hypertension 09/16/2007  . GERD without esophagitis 09/16/2007  . ESOPHAGEAL STRICTURE 04/04/2007  . HIATAL HERNIA 04/04/2007     Psychiatric History:  No prior  psychiatric history  Family Med/Psych History:  Family History  Problem Relation Age of Onset  . Heart disease Father   . Heart disease Brother 65  . Anesthesia problems Mother   . Heart disease Mother   . Dementia Mother   . Diabetes Sister   . Stroke Sister   . Colon cancer Neg Hx   . Rectal cancer Neg Hx   . Stomach cancer Neg Hx     Impression/DX:  BUTLER VEGH is a 65 year old male with a history of type 2 diabetes, hypertension, previous spinal cord injury, pancreatic cancer with recurrence and recent admission on 10/18/2020 for right hip fracture complicated by UTI/strep bacteremia and urinary retention.  Patient was discharged to [facility] skilled nursing for rehab.  Patient was readmitted from SNF on 11/05/2020 with SIRS and was found to have multiple liver abscesses.  Patient underwent intervention and revealed strep infection and urine culture positive for E. coli.  Patient continued to have issues with confusion and MRI done revealing suspected areas of acute ischemia in right temporal and parietal lobe regions but with diffusion changes.  CT head revealed late subacute or chronic subdural hematoma measuring 10 mm in thickness.  On 4/11 he developed seizure and was loaded with Keppra.  Patient was taken to the OR on 11/09/2020 for bur hole evacuation.  His antibiotics were changed on 4/12 as liver aspiration was positive for Klebsiella and E. coli and concerns of infected subdural hematoma.  Postoperatively, the patient had significant improvement in speech and cognition with continued current dose of Keppra.  Patient had ongoing difficulties with functioning recommended for CIR due to functional deficits.  Today was a follow-up visit.  The patient is being discharged tomorrow.  He was alert and oriented and sitting slightly reclined in his wheelchair.  The patient discussed some of the obstacles that he will face with his discharge tomorrow including the reaction his wife has had  various aspects of his return home that she will have to aid with.  1 particular issue that she has been very distressed about is his catheter collection bag and that she is very distressed by the possibility that he could be seen from his wheelchair by others.  The patient does not appear to be distressed by this.  The patient reports that his hip is doing well without significant stress but does continue to have pain in his knee.  Patient denies any significant depression or anxiety.  Disposition/Plan:  Patient is being discharged tomorrow and has made significant improvements but continued to need assistance with wheelchair and will need help from his wife after discharge which may create  some obstacles for him going forward.          Electronically Signed   _______________________ Ilean Skill, Psy.D. Clinical Neuropsychologist

## 2020-12-07 NOTE — Progress Notes (Signed)
Occupational Therapy Session Note  Patient Details  Name: Colton Bush MRN: [id number] Date of Birth: Mar 13, 1956  Today's Date: 12/07/2020 OT Individual Time: 0700-0800 OT Individual Time Calculation (min): 60 min    Short Term Goals: Week 3:  OT Short Term Goal 1 (Week 3): LTG=STG 2/2 ELOS  Skilled Therapeutic Interventions/Progress Updates:    Pt resting in bed upon arrival and ready to get OOB. Pt denied use of toilet this morning. Supine>sit EOB with supervision and HOB elevated. Sit>stand from EOB with supervision. Pt amb with RW to w/c with CGA. Pt completed UB bathing/dressing seated in w/c at sink with supervision. Sit<>stand from w/c with min A this morning. LB dressing with min A. Pt symptomatic for orthostatic BP with Ted hose donned. BP 102/66 seated immediately after sitting down. Unable to locate abdominal binder in room. Reviewed home safety recommendations. Wife to attend next OT session for continued education. Pt remained in TIS w/c with belt alarm activated and all needs within reach.    Therapy Documentation Precautions:  Precautions Precautions: Fall Restrictions Weight Bearing Restrictions: No RLE Weight Bearing: Weight bearing as tolerated Pain: Pain Assessment Pain Score: 0-No pain   Therapy/Group: Individual Therapy  [name] 12/07/2020, 9:35 AM

## 2020-12-07 NOTE — Progress Notes (Signed)
Physical Therapy Session Note  Patient Details  Name: Colton Bush MRN: [id number] Date of Birth: 07/31/1956  Today's Date: 12/07/2020 PT Individual Time: 1430-1500 PT Individual Time Calculation (min): 30 min   Short Term Goals: Week 1:  PT Short Term Goal 1 (Week 1): pt to demonstrate supine<>sit min A PT Short Term Goal 1 - Progress (Week 1): Progressing toward goal PT Short Term Goal 2 (Week 1): pt to demonstrate bed<>chair transfers mod A with LRAD PT Short Term Goal 2 - Progress (Week 1): Progressing toward goal PT Short Term Goal 3 (Week 1): pt to demonstrate WC mobility 75 min A PT Short Term Goal 3 - Progress (Week 1): Met PT Short Term Goal 4 (Week 1): to initiate gait PT Short Term Goal 4 - Progress (Week 1): Not met Week 2:  PT Short Term Goal 1 (Week 2): Pt will perform least restrictive transfer with mod A consistently PT Short Term Goal 1 - Progress (Week 2): Met PT Short Term Goal 2 (Week 2): Pt will initiate gait training PT Short Term Goal 2 - Progress (Week 2): Met PT Short Term Goal 3 (Week 2): Pt will perform bed mobility with min A consistently PT Short Term Goal 3 - Progress (Week 2): Met Week 3:  PT Short Term Goal 1 (Week 3): Pt will complete least restrictive transfer with min A consistently PT Short Term Goal 2 (Week 3): Pt will ambulate x 50 ft with LRAD and assist x 1 PT Short Term Goal 3 (Week 3): Pt will initiate stair training  Skilled Therapeutic Interventions/Progress Updates:    pt received in bed and agreeable to therapy. Pt given and viewed by PT, HEP with supine ankle pumps, heel slides, glute sets, and seated marching and LAQ and seated hamstring stretches with frequency, descriptions and pictures included. Pt reviewed these with PT verbally and then demonstrated exercises with minimal questions with good technique. Pt asked questions regarding mobility in home and pt educated to use [facility] for majority of mobility and walker for transfers with  assistance from additional person. Pt then denied any additional concerns or questions about pending DC home. Pt then left in bed, All needs in reach and in good condition. Call light in hand.    Therapy Documentation Precautions:  Precautions Precautions: Fall Restrictions Weight Bearing Restrictions: No RLE Weight Bearing: Weight bearing as tolerated General:   Vital Signs: Therapy Vitals Temp: 98.4 F (36.9 C) Pulse Rate: 99 Resp: 17 BP: (!) 89/63 Patient Position (if appropriate): Sitting Oxygen Therapy SpO2: 97 % O2 Device: Room Air Pain: Pain Assessment Pain Scale: 0-10 Pain Score: 3  Pain Type: Acute pain Pain Location: Knee Pain Orientation: Left Pain Descriptors / Indicators: Aching Pain Frequency: Constant Pain Onset: On-going Pain Intervention(s): Medication (See eMAR) (tylenol) Mobility:   Locomotion :    Trunk/Postural Assessment :    Balance:   Exercises:   Other Treatments:      Therapy/Group: Individual Therapy  Junie Panning 12/07/2020, 3:32 PM

## 2020-12-07 NOTE — Patient Care Conference (Signed)
Inpatient RehabilitationTeam Conference and Plan of Care Update Date: 12/07/2020   Time: 11:00 AM    Patient Name: Colton Bush      Medical Record Number: [id number]  Date of Birth: [birth year] Sex: Male         Room/Bed: 4M12C/4M12C-01 Payor Info: Payor: MEDICARE / Plan: MEDICARE PART A AND B / Product Type: *No Product type* /    Admit Date/Time:  11/12/2020  5:52 PM  Primary Diagnosis:  Subdural hematoma [facility])  Hospital Problems: Principal Problem:   Subdural hematoma (HCC) Active Problems:   Liver abscess   Bladder outlet obstruction   Seizure in setting of subdural hematoma   Toxic encephalopathy   Pain   Nausea & vomiting    Expected Discharge Date: Expected Discharge Date: 12/08/20  Team Members Present: Physician leading conference: Dr. Courtney Heys Care Coodinator Present: Loralee Pacas, LCSWA;Brennen Camper Creig Hines, RN, BSN, [place] Nurse Present: Dorthula Nettles, RN PT Present: Excell Seltzer, PT OT Present: Willeen Cass, OT;Roanna Epley, COTA SLP Present: Charolett Bumpers, SLP PPS Coordinator present : Gunnar Fusi, SLP     Current Status/Progress Goal Weekly Team Focus  Bowel/Bladder   Continent BM, but sometimes can have accidents. He is on foley catheter draining well  Improved bowel continence. Pt is planned to go home with foley catheter  scheduled toileting, continue good FC care   Swallow/Nutrition/ Hydration             ADL's   assist levels fluctuate from min A to CGA/supervision for bathing at w/c level and dressing with sit<>stand from w/c; toileting with CGA; functional tranfsers with CGA/min A  CGA/supervision overall  education, activity tolerance, discharge planning   Mobility   Supervision bed mobility, min A transfers with RW, gait up to 35 ft with RW min A, Supervision w/c mobility, mod A stairs  min A overall, short distance gait  family education, d/c planning   Communication             Safety/Cognition/ Behavioral Observations   Min-supervision A  min-supervision A  education   Pain   Patient is pain free.  Keep patient pain free  Frequent pain assessment and offer pain management if needed. Encourage repositioning   Skin   Sacrum redness, looks raw. Applied skin barrier cream and kept clean and dry every after BM  Improve skin integrity  Monitor for further skin breakdown, apply barrier cream and frequent pad checks and change when necessary. Q 2 turns to relieve pressure     Discharge Planning:  D/c to home with 24/7 care from his wife. Pt on IV abx until 5/24. [place] and IV abx-[facility]. Fam edu to be completed on 5/10 10am-12pm. Iv abx education completed today with [facility].   Team Discussion: Patient is going home on IV antibiotics, PICC line to be inserted today, patient wants a leg bag for his foley. Added ab binder, ted's, and Midodrine for orthostatic hypotension. Incontinent bowel, chronic foley, mild pain managed with Tylenol. Surgical site healed, generalized bruising, right heel has DTI versus Stage 1? Has HHA set up and infusion company has educated patient and wife. Patient on target to meet rehab goals: yes, PT reports patient is min assist overall, will have a WC at discharge. OT reports he is at goal level. SLP reports patient met all goals.  *See Care Plan and progress notes for long and short-term goals.   Revisions to Treatment Plan:  Added ab binder, ted's,  and Midodrine for orthostatic hypotension.  Teaching Needs: Family education, medication management, pain management, skin/wound care, PICC line care, foley care, transfer training, gait training, balance training, endurance training, safety awareness, stair training.  Current Barriers to Discharge: Decreased caregiver support, Medical stability, Home enviroment access/layout, IV antibiotics, Incontinence, Neurogenic bowel and bladder, Wound care, Lack of/limited family support, Weight, Medication  compliance, Behavior and Nutritional means  Possible Resolutions to Barriers: Continue current medications, offer nutritional support, provide emotional support.     Medical Summary Current Status: has foley- going home with secondary retention; wounds have healed except R heel- DTI? vs Stage I; bowel incontinence; Orthostatic hypotension  Barriers to Discharge: Neurogenic Bowel & Bladder;Incontinence;Home enviroment access/layout;Decreased family/caregiver support;Medical stability;Weight;Wound care;Nutrition means  Barriers to Discharge Comments: family training done yesterday and today Possible Resolutions to Barriers/Weekly Focus: Orthostatic hypotension- recognizes Sx's- education is done; and going home on Midodrine- getting leg bag for d/c. d/c 12/08/20   Continued Need for Acute Rehabilitation Level of Care: The patient requires daily medical management by a physician with specialized training in physical medicine and rehabilitation for the following reasons: Direction of a multidisciplinary physical rehabilitation program to maximize functional independence : Yes Medical management of patient stability for increased activity during participation in an intensive rehabilitation regime.: Yes Analysis of laboratory values and/or radiology reports with any subsequent need for medication adjustment and/or medical intervention. : Yes   I attest that I was present, lead the team conference, and concur with the assessment and plan of the team.   Cristi Loron 12/07/2020, 3:16 PM

## 2020-12-07 NOTE — Progress Notes (Signed)
Physical Therapy Session Note  Patient Details  Name: MARQUAVIUS SCAIFE MRN: [id number] Date of Birth: 06/26/1956  Today's Date: 12/07/2020 PT Individual Time: 1130-1200 PT Individual Time Calculation (min): 30 min   Short Term Goals: Week 3:  PT Short Term Goal 1 (Week 3): Pt will complete least restrictive transfer with min A consistently PT Short Term Goal 2 (Week 3): Pt will ambulate x 50 ft with LRAD and assist x 1 PT Short Term Goal 3 (Week 3): Pt will initiate stair training  Skilled Therapeutic Interventions/Progress Updates:    Pt received seated in TIS chair in room with wife present for family education session. Demonstrated management of w/c parts and how to disassemble for transport in car. Pt's wife able to perform return demo of w/c management. Pt is min A for sit to stand transfers throughout session. Ambulation x 15 ft with RW and min A for balance. Pt's wife able to perform return demo of assisting him with transfers and short distance gait. Reviewed how to navigate stairs with no handrails with use of RW. Attempted to have pt perform stairs backwards with RW to simulate home environment. Pt unable to safely navigate stairs this date. CSW notified that pt unable to safely perform stairs at this time to determine if he can take medical transport home. Pt left seated in TIS chair in room with needs in reach, quick release belt in place, wife present at end of session.  Therapy Documentation Precautions:  Precautions Precautions: Fall Restrictions Weight Bearing Restrictions: No RLE Weight Bearing: Weight bearing as tolerated     Therapy/Group: Individual Therapy   Excell Seltzer, PT, DPT, CSRS  12/07/2020, 12:05 PM

## 2020-12-07 NOTE — Progress Notes (Signed)
Peripherally Inserted Central Catheter Placement  The IV Nurse has discussed with the patient and/or persons authorized to consent for the patient, the purpose of this procedure and the potential benefits and risks involved with this procedure.  The benefits include less needle sticks, lab draws from the catheter, and the patient may be discharged home with the catheter. Risks include, but not limited to, infection, bleeding, blood clot (thrombus formation), and puncture of an artery; nerve damage and irregular heartbeat and possibility to perform a PICC exchange if needed/ordered by physician.  Alternatives to this procedure were also discussed.  Bard Power PICC patient education guide, fact sheet on infection prevention and patient information card has been provided to patient /or left at bedside.    PICC Placement Documentation  PICC Single Lumen 63/01/60 Right Basilic 41 cm 1 cm (Active)  Indication for Insertion or Continuance of Line Home intravenous therapies (PICC only) 12/07/20 2038  Exposed Catheter (cm) 1 cm 12/07/20 2038  Site Assessment Clean;Dry;Intact 12/07/20 2038  Line Status Blood return noted;Flushed;Saline locked 12/07/20 2038  Dressing Type Transparent;Occlusive;Securing device 12/07/20 2038  Dressing Status Clean;Dry;Intact 12/07/20 2038  Antimicrobial disc in place? Yes 12/07/20 2038  Safety Lock Not Applicable 10/93/23 5573  Line Adjustment (NICU/IV Team Only) No 12/07/20 2038  Dressing Intervention New dressing 12/07/20 2038  Dressing Change Due 12/14/20 12/07/20 2038       Colton Bush 12/07/2020, 8:59 PM

## 2020-12-07 NOTE — Progress Notes (Signed)
Physical Therapy Discharge Summary  Patient Details  Name: Colton Bush MRN: [id number] Date of Birth: 03-27-1956   Patient has met 9 of 11 long term goals due to improved activity tolerance, improved balance, improved postural control, increased strength, decreased pain and ability to compensate for deficits.  Patient to discharge at a wheelchair level [place].   Patient's care partner is independent to provide the necessary physical and cognitive assistance at discharge. Pt's wife has completed hands on family education and is safe to assist pt upon d/c home.  Reasons goals not met: Pt did not meet gait goal of 50 ft as he is only able to ambulate 25 ft at most with min A and RW, therefore pt will require use of a w/c upon d/c home. Pt also did not meet goal of min A on stairs and is only able to complete one 3" step with mod A and 2 handrails. Pt will require medical transport home as he cannot safely navigate stairs at this time.  Recommendation:  Patient will benefit from ongoing skilled PT services in home health setting to continue to advance safe functional mobility, address ongoing impairments in endurance, strength, balance, safety, independence with functional mobility, standing tolerance, and minimize fall risk.  Equipment: 18x18 w/c  Reasons for discharge: treatment goals met and discharge from hospital  Patient/family agrees with progress made and goals achieved: Yes  PT Discharge Precautions/Restrictions Precautions Precautions: Fall Restrictions Weight Bearing Restrictions: Yes RLE Weight Bearing: Weight bearing as tolerated Vision/Perception  Perception Perception: Within Functional Limits Praxis Praxis: Intact  Cognition Overall Cognitive Status: Impaired/Different from baseline Arousal/Alertness: Awake/alert Orientation Level: Oriented X4 Attention: Selective Selective Attention: Impaired Memory: Impaired Memory Impairment: Storage deficit;Retrieval  deficit Awareness: Impaired Awareness Impairment: Anticipatory impairment Problem Solving: Impaired Safety/Judgment: Impaired Sensation Sensation Light Touch: Appears Intact Proprioception: Appears Intact Coordination Gross Motor Movements are Fluid and Coordinated: Yes Fine Motor Movements are Fluid and Coordinated: Yes Motor  Motor Motor: Abnormal postural alignment and control Motor - Skilled Clinical Observations: global weakness impacting alignement  Mobility Bed Mobility Bed Mobility: Rolling Right;Rolling Left;Supine to Sit;Sit to Supine Rolling Right: Supervision/verbal cueing Rolling Left: Supervision/Verbal cueing Supine to Sit: Supervision/Verbal cueing Sit to Supine: Supervision/Verbal cueing Transfers Transfers: Sit to Stand;Stand to Sit;Stand Pivot Transfers Sit to Stand: Minimal Assistance - Patient > 75% Stand to Sit: Minimal Assistance - Patient > 75% Stand Pivot Transfers: Minimal Assistance - Patient > 75% Transfer (Assistive device): Rolling walker Locomotion  Gait Ambulation: Yes Gait Assistance: Minimal Assistance - Patient > 75% Gait Distance (Feet): 25 Feet Assistive device: Rolling walker Gait Assistance Details: Verbal cues for sequencing;Verbal cues for technique;Verbal cues for precautions/safety;Verbal cues for gait pattern;Verbal cues for safe use of DME/AE Gait Gait: Yes Gait Pattern: Impaired Gait Pattern: Narrow base of support Gait velocity: decreased Stairs / Additional Locomotion Stairs: Yes Stairs Assistance: Moderate Assistance - Patient 50 - 74% Stair Management Technique: Two rails;Step to pattern Number of Stairs: 1 Height of Stairs: 3 Architect: Yes Wheelchair Assistance: Independent with Camera operator: Both upper extremities Wheelchair Parts Management: Needs assistance Distance: 200  Trunk/Postural Assessment  Cervical Assessment Cervical Assessment: Exceptions to  [facility] (forward head) Thoracic Assessment Thoracic Assessment: Exceptions to [facility] (rounded shoulders) Lumbar Assessment Lumbar Assessment: Exceptions to [facility] (posterior pelvic tilt) Postural Control Postural Control: Deficits on evaluation  Balance Balance Balance Assessed: Yes Static Sitting Balance Static Sitting - Balance Support: No upper extremity supported;Feet supported Static Sitting - Level of  Assistance: 6: Modified independent (Device/Increase time) Dynamic Sitting Balance Dynamic Sitting - Balance Support: No upper extremity supported;Feet supported;During functional activity Dynamic Sitting - Level of Assistance: 5: Stand by assistance Static Standing Balance Static Standing - Balance Support: Bilateral upper extremity supported;During functional activity Static Standing - Level of Assistance: 4: Min assist Extremity Assessment   RLE Assessment RLE Assessment: Exceptions to [facility] RLE Strength Right Hip Flexion: 3/5 Right Knee Flexion: 4/5 Right Knee Extension: 4/5 Right Ankle Dorsiflexion: 4/5 LLE Assessment LLE Assessment: Within Functional Limits General Strength Comments: 4/5 grossly     Excell Seltzer, PT, DPT, CSRS 12/08/2020, 9:21 AM

## 2020-12-07 NOTE — Progress Notes (Signed)
PROGRESS NOTE   Subjective:  Pt on Midodrine for Hypotension- abd binder added with therapy yesterday- pt said a little better today.  Nausea hasn't come back.  Having family training today at [place].  Plans to get PICC this AM, to go home on IV ABX.  Insists has leg bag- d/w nurse- since "wife won't let him out of the house with foley bag".  ROS:   Pt denies SOB, abd pain, CP, N/V/C/D, and vision changes    Objective:   [place] EKG SITE RITE  Result Date: 12/06/2020 If Site Rite image not attached, placement could not be confirmed due to current cardiac rhythm.  Recent Labs    12/05/20 0530 12/06/20 0500  WBC 6.1 7.2  HGB 9.7* 10.0*  HCT 30.2* 30.8*  PLT 504* 491*   Recent Labs    12/05/20 0530 12/06/20 0500  NA 133* 135  K 3.9 4.5  CL 102 100  CO2 27 29  GLUCOSE 104* 153*  BUN 6* 7*  CREATININE 0.47* 0.50*  CALCIUM 8.3* 8.7*    Intake/Output Summary (Last 24 hours) at 12/07/2020 0918 Last data filed at 12/07/2020 0748 Gross per 24 hour  Intake 680 ml  Output 1675 ml  Net -995 ml     Pressure Injury 11/07/20 Heel Right Stage 1 -  Intact skin with non-blanchable redness of a localized area usually over a bony prominence. (Active)  11/07/20 0800  Location: Heel  Location Orientation: Right  Staging: Stage 1 -  Intact skin with non-blanchable redness of a localized area usually over a bony prominence.  Wound Description (Comments):   Present on Admission:      Pressure Injury 11/07/20 Heel Right Deep Tissue Pressure Injury - Purple or maroon localized area of discolored intact skin or blood-filled blister due to damage of underlying soft tissue from pressure and/or shear. (Active)  11/07/20 2000  Location: Heel  Location Orientation: Right  Staging: Deep Tissue Pressure Injury - Purple or maroon localized area of discolored intact skin or blood-filled blister due to damage of underlying soft tissue from  pressure and/or shear.  Wound Description (Comments):   Present on Admission:     Physical Exam: Vital Signs Blood pressure (!) 144/74, pulse 66, temperature 98.4 F (36.9 C), resp. rate 17, height _0  (1.803 m), weight 76.2 kg, SpO2 98 %.    General: awake, alert, appropriate, grooming at sink- not wearing abd binder yet; OT in room;  NAD HENT: conjugate gaze; oropharynx moist; crani site healed CV: regular rate; no JVD Pulmonary: CTA B/L; no W/R/R- good air movement GI: soft, NT, ND, (+)BS Psychiatric: focused on leg bag; less depressed Neurological: alert,  Musculoskeletal:  Cervical back: Normal range of motionand neck supple.  Comments: RLE staples out.   UE's 5-/5 but equal B/L  LE's 4+/5 in HF, KE, DF and PF B/L  Skin: General: Skin is warmand dry. Low back B/L has excoriations from scratching and red/slightly excoriated L hamstring area.  Comments: RLE incisions as well as abd incisions already noted- staples out.  Intact to light touch in all 4 extremities     Assessment/Plan: 1. Functional deficits which require 3+ hours per  day of interdisciplinary therapy in a comprehensive inpatient rehab setting.  Physiatrist is providing close team supervision and 24 hour management of active medical problems listed below.  Physiatrist and rehab team continue to assess barriers to discharge/monitor patient progress toward functional and medical goals  Care Tool:  Bathing    Body parts bathed by patient: Right arm,Left arm,Chest,Abdomen,Front perineal area,Right upper leg,Left upper leg,Left lower leg,Face,Buttocks,Right lower leg   Body parts bathed by helper: Buttocks,Right lower leg     Bathing assist Assist Level: Supervision/Verbal cueing     Upper Body Dressing/Undressing Upper body dressing   What is the patient wearing?: Pull over shirt    Upper body assist Assist Level: Independent    Lower Body Dressing/Undressing Lower body  dressing      What is the patient wearing?: Pants     Lower body assist Assist for lower body dressing: Minimal Assistance - Patient > 75%     Toileting Toileting    Toileting assist Assist for toileting: Supervision/Verbal cueing     Transfers Chair/bed transfer  Transfers assist     Chair/bed transfer assist level: Minimal Assistance - Patient > 75%     Locomotion Ambulation   Ambulation assist   Ambulation activity did not occur: Safety/medical concerns  Assist level: 2 helpers Assistive device: Walker-rolling Max distance: 25'   Walk 10 feet activity   Assist  Walk 10 feet activity did not occur: Safety/medical concerns  Assist level: 2 helpers Assistive device: Walker-rolling   Walk 50 feet activity   Assist Walk 50 feet with 2 turns activity did not occur: Safety/medical concerns         Walk 150 feet activity   Assist Walk 150 feet activity did not occur: Safety/medical concerns         Walk 10 feet on uneven surface  activity   Assist Walk 10 feet on uneven surfaces activity did not occur: Safety/medical concerns         Wheelchair     Assist Will patient use wheelchair at discharge?: Yes Type of Wheelchair: Manual Wheelchair activity did not occur: Safety/medical concerns  Wheelchair assist level: Supervision/Verbal cueing Max wheelchair distance: 200'    Wheelchair 50 feet with 2 turns activity    Assist    Wheelchair 50 feet with 2 turns activity did not occur: Safety/medical concerns   Assist Level: Supervision/Verbal cueing   Wheelchair 150 feet activity     Assist  Wheelchair 150 feet activity did not occur: Safety/medical concerns   Assist Level: Supervision/Verbal cueing   Blood pressure (!) 144/74, pulse 66, temperature 98.4 F (36.9 C), resp. rate 17, height _0  (1.803 m), weight 76.2 kg, SpO2 98 %.  Medical Problem List and Plan: 1.L frontal craniotomysecondary to LDH/SAH from  fall -patient may Shower if incisions covered -ELOS/Goals: 2-3 weeks- mod I to supervision          -continue PT and OT- explained to pt, they are concerned about "early d/c"_ that if he hasn't made enough gains, can prolong stay.   5/10- con't PT and OT- wife coming for family training today  2. Impaired mobility: -DVT/anticoagulation: Pharmaceutical: Continue Lovenox -antiplatelet therapy: N/a 3. Low back pain: continue air mattress. Resolved. Dicussed XR findings of degeneration.   4/25- was having back pain- and R knee pain- if doesn't improve, will schedule Am pain meds  5/2- pain doing OK- better in knees especially- con't regimen  5/6- pain was worse in R knee this AM- buckled  slightly when on toilet- con't pain regimen for now  5/10- pain controlled per pt- con't regimen 4. Mood: LCSW to follow for evaluation and support.  5/5- starting Celexa 20 mg daily- doesn't feel theres "any reason to live".    5/6- pt happy that "he's not the only one" with depression in this situation- OK with Celexa- might need Remeron if appetite doesn't pick up?  5/7- still very depressed- takes a few days to kick in- pt very frustrated- will monitor  5/10- pt feeling a little bette-r but frustrated with wife about leg bag issue- con't regimen -antipsychotic agents: N/A 5. Neuropsych: This patient is capable of making decisions on his own behalf. 6. Multiple deep tissue injuries/Skin/Wound Care: Continue Air mattress overlay with pressure relief measures.  --Continue vitamins and collagen to promote wound healing  7. Fluids/Electrolytes/Nutrition: Monitor I/O. Check lytes in am. 8. SDH (concerns of bacterial infection): S/p Burr hole evacuation  9. Strep anginosis bacteremia: Continue Ceftriaxone/Metronidazole X 6 weeks w/end date  10 Liver abscess s/p JP drain placement: Continue flushes TID and document output.   -IR is following.  4/28-  getting CT scan on abd/pelvis- with contrast- this AM- pending results-   4/29- looks a little better- con't IV ABX-   5/2- JP drain removed!- con't wound care daily and prn 11. New onset seizures: Continue Keppra bid till follow up with neuro on outpatient basis.  12. Left hip ORIF: WBAT. Will consult ortho to eval given worsening pain. Added MS contin for better pain control/participation with therapy. Appears to have helped, continue.   4/26- will remove staples 13. T2DM: Hgb A1C- 10.8. Will monitor BS ac/hs. Home regimen Levemir 10u am/15u pm with SSI.  --continue Levemir 13u bid with SSI and titrate insulin as indicated.             -4/23 improved control  4/25- BGs 68-256 in last 24 hours- much more labile today- if still an issue tomorrow, will ask DM coordinators.   5/7- BGs doing overall better, because not eating- will wait to call Dm coordinator  5/9-  BGs much better- continue regimen  5/10- eating better- will increase 70/30 slightly to 19 units BID 14. BPH/Urinary retention: Foley replaced--keep in place for decompression for few days then start bladder program next week?.  --will increase flomax to 0.8 mg/HS  5/3- is still being cathed- has not been able to void- will check with family if want him to go home with foley vs in/out caths-     5/5- will place foley- have nurse let me know when to place. Will go home with it.    5/6- will make f/u with Urology- also continue Flomax since might void in future.  5/10- pt insists on leg bag- spoke with nurse about this- she will help him get some  15. Pancreatic cancer with recurrence: Continue Creon with meals. --follow up with Hem/onc and Radiation Onc after discharge.  -wife asking about plans for radiation/chemo: will contact oncology to ask them to discuss with wife.  4/28- to start radiation 11/30/20 -is set up. At 1:30pm  5/2- asked SW to make sure therapy doesn't schedule during radiation- or  afterwards  5/2- radiation got pushed to next week- will see if needs to do before d/c.  5/5- let them know/radiation that has d/c next week.  5/6- pushed radiation back.    5/8- weight down even more- will discuss starting supplements.   5/10- added Boost/Glucerna for lower weight 16. Low  back pain/muscle spasms- will start Robaxin prn and lidoderm patches 1- 8pm to 8am.              4/16: denies pain: decrease robaxin to q12H prn.  17. Hypokalemia: scheduled supplement and repeat BMP Monday 18. Overweight BMI 25.52: provide counseling.  19. Elevated alk phos: increasing,  Oncology notified. Oncology has discussed plans with wife for post- CIR follow-up.  20. Right sided pleural effusion: could be contributing to his chest pain. Consulted IR regarding drainage/pleural studies given active cancer. ADDENDUM: s/p successful thoracentesis  -pt denies any SOB  4/25- VRE grew from Pleural effusion- ID to see today- linezolid started.    4/26- seen by ID- will con't Linizolid  4/28- will check CMP/CBC/CRP/ESR qMonday.  4/29-  Pleural abscess has decreased in size per CT- con't ABX   5/2- WBC down to 6.7- con't regimen  5/9- don't know when IV ABX to stop- might need PICC  5/9: WBC normal- will check regarding abx duration  5/10- on IV ABX until 5/24 - getting PICC today.  21. Hypertension with hypotension: continue to monitor BP TID  5/3- BP well controlled- con't regimen  5/4- had orthostatic hypotension yesterday- encouraged pt to drink (not drinking enough) and to take Midodrine 5 mg TID prn for low BP. n  5/6- had another episode of hypotension yesterday- will schedule 2.5 mg TID Midodrine- since never know when it will occur- also encouraged PO/liquid intake.   5/8- increased midodrine to 5 mg TID.   5/9: BP labile- continue midodrine, add abdominal binder with therapy  5/10- slightly better today- con't regimen and TEDs 22. Loose stools in setting of previous constipation  5/5- had 3  loose BMs this AM- if continues, might need to check C Diff vs imodium? Will monitor  5/6- better- con't to monitor 23. Nausea/Vomiting  5/7- getting worse per pt- and "losing weight" per pt- will order daily weights; and called GI- they suggested reglan 10 mg TID for now and will see him when possible.   5/8- nausea resolved today- con't reglan for 3 days.     LOS: 25 days A FACE TO FACE EVALUATION WAS PERFORMED  Colton Bush 12/07/2020, 9:18 AM

## 2020-12-08 ENCOUNTER — Ambulatory Visit: Payer: Medicare Other | Admitting: Radiation Oncology

## 2020-12-08 ENCOUNTER — Other Ambulatory Visit: Payer: Self-pay

## 2020-12-08 DIAGNOSIS — C259 Malignant neoplasm of pancreas, unspecified: Secondary | ICD-10-CM | POA: Diagnosis not present

## 2020-12-08 DIAGNOSIS — B9620 Unspecified Escherichia coli [E. coli] as the cause of diseases classified elsewhere: Secondary | ICD-10-CM | POA: Diagnosis not present

## 2020-12-08 DIAGNOSIS — B954 Other streptococcus as the cause of diseases classified elsewhere: Secondary | ICD-10-CM | POA: Diagnosis not present

## 2020-12-08 DIAGNOSIS — B961 Klebsiella pneumoniae [K. pneumoniae] as the cause of diseases classified elsewhere: Secondary | ICD-10-CM | POA: Diagnosis not present

## 2020-12-08 DIAGNOSIS — K750 Abscess of liver: Secondary | ICD-10-CM | POA: Diagnosis not present

## 2020-12-08 DIAGNOSIS — Z452 Encounter for adjustment and management of vascular access device: Secondary | ICD-10-CM | POA: Diagnosis not present

## 2020-12-08 LAB — GLUCOSE, CAPILLARY: Glucose-Capillary: 149 mg/dL — ABNORMAL HIGH (ref 70–99)

## 2020-12-08 MED ORDER — ASCORBIC ACID 500 MG PO TABS: 500.0000 mg | ORAL_TABLET | Freq: Every day | ORAL | 0 refills | Status: DC

## 2020-12-08 MED ORDER — HYDROCODONE-ACETAMINOPHEN 5-325 MG PO TABS: 1.0000 | ORAL_TABLET | ORAL | 0 refills | Status: DC | PRN

## 2020-12-08 MED ORDER — ZINC SULFATE 220 (50 ZN) MG PO CAPS: 220.0000 mg | ORAL_CAPSULE | Freq: Every day | ORAL | 0 refills | Status: DC

## 2020-12-08 MED ORDER — NOVOLOG FLEXPEN 100 UNIT/ML ~~LOC~~ SOPN: 2.0000 [IU] | PEN_INJECTOR | Freq: Two times a day (BID) | SUBCUTANEOUS | 0 refills | Status: DC

## 2020-12-08 MED ORDER — INSULIN DETEMIR 100 UNIT/ML FLEXPEN: 15.0000 [IU] | PEN_INJECTOR | Freq: Every day | SUBCUTANEOUS | 0 refills | Status: DC

## 2020-12-08 MED ORDER — HYDROCERIN EX CREA: 1.0000 "application " | TOPICAL_CREAM | Freq: Two times a day (BID) | CUTANEOUS | 0 refills | Status: DC

## 2020-12-08 MED ORDER — TAMSULOSIN HCL 0.4 MG PO CAPS: 0.8000 mg | ORAL_CAPSULE | Freq: Every day | ORAL | 0 refills | Status: DC

## 2020-12-08 MED ORDER — LIDOCAINE 5 % EX PTCH: 1.0000 | MEDICATED_PATCH | CUTANEOUS | 0 refills | Status: DC

## 2020-12-08 MED ORDER — HYDROCORTISONE 1 % EX CREA: TOPICAL_CREAM | Freq: Two times a day (BID) | CUTANEOUS | 0 refills | Status: DC

## 2020-12-08 MED ORDER — CITALOPRAM HYDROBROMIDE 20 MG PO TABS: 20.0000 mg | ORAL_TABLET | Freq: Every day | ORAL | 0 refills | Status: DC

## 2020-12-08 MED ORDER — PANCRELIPASE (LIP-PROT-AMYL) 24000-76000 UNITS PO CPEP: 24000.0000 [IU] | ORAL_CAPSULE | Freq: Three times a day (TID) | ORAL | 0 refills | Status: DC

## 2020-12-08 MED ORDER — ONDANSETRON 4 MG PO TBDP: 4.0000 mg | ORAL_TABLET | Freq: Four times a day (QID) | ORAL | 0 refills | Status: DC | PRN

## 2020-12-08 MED ORDER — PEN NEEDLES 32G X 6 MM MISC: 1.0000 "application " | 2 refills | Status: DC | PRN

## 2020-12-08 MED ORDER — CALCIUM POLYCARBOPHIL 625 MG PO TABS: 625.0000 mg | ORAL_TABLET | Freq: Two times a day (BID) | ORAL | 0 refills | Status: DC

## 2020-12-08 MED ORDER — DICLOFENAC SODIUM 1 % EX GEL: 2.0000 g | Freq: Four times a day (QID) | CUTANEOUS | 0 refills | Status: DC

## 2020-12-08 MED ORDER — MIDODRINE HCL 5 MG PO TABS: 5.0000 mg | ORAL_TABLET | Freq: Three times a day (TID) | ORAL | 0 refills | Status: DC

## 2020-12-08 MED ORDER — INSULIN ASPART PROT & ASPART (70-30 MIX) 100 UNIT/ML ~~LOC~~ SUSP: 19.0000 [IU] | Freq: Two times a day (BID) | SUBCUTANEOUS | 11 refills | Status: DC

## 2020-12-08 MED ORDER — SIMETHICONE 80 MG PO CHEW: 80.0000 mg | CHEWABLE_TABLET | Freq: Three times a day (TID) | ORAL | 0 refills | Status: DC

## 2020-12-08 MED ORDER — ACETAMINOPHEN 325 MG PO TABS: 325.0000 mg | ORAL_TABLET | ORAL | Status: DC | PRN

## 2020-12-08 MED ORDER — MORPHINE SULFATE ER 30 MG PO TBCR: 30.0000 mg | EXTENDED_RELEASE_TABLET | Freq: Every day | ORAL | 0 refills | Status: DC

## 2020-12-08 MED ORDER — HEPARIN SOD (PORK) LOCK FLUSH 100 UNIT/ML IV SOLN
250.0000 [IU] | INTRAVENOUS | Status: AC | PRN
  Administered 2020-12-08: 250 [IU]
  Filled 2020-12-08: qty 2.5

## 2020-12-08 MED ORDER — LEVETIRACETAM 1000 MG PO TABS: 1000.0000 mg | ORAL_TABLET | Freq: Two times a day (BID) | ORAL | 0 refills | Status: DC

## 2020-12-08 MED ORDER — METRONIDAZOLE 500 MG PO TABS: 500.0000 mg | ORAL_TABLET | Freq: Three times a day (TID) | ORAL | 0 refills | Status: DC

## 2020-12-08 MED ORDER — POTASSIUM CHLORIDE 20 MEQ PO PACK: 40.0000 meq | PACK | Freq: Every day | ORAL | 0 refills | Status: DC

## 2020-12-08 MED ORDER — LORATADINE 10 MG PO TABS: 10.0000 mg | ORAL_TABLET | Freq: Every day | ORAL | 0 refills | Status: DC

## 2020-12-08 MED ORDER — INSULIN DETEMIR 100 UNIT/ML FLEXPEN: 15.0000 [IU] | PEN_INJECTOR | Freq: Two times a day (BID) | SUBCUTANEOUS | 0 refills | Status: DC

## 2020-12-08 MED ORDER — NOVOLOG FLEXPEN 100 UNIT/ML ~~LOC~~ SOPN: 2.0000 [IU] | PEN_INJECTOR | Freq: Three times a day (TID) | SUBCUTANEOUS | 0 refills | Status: DC

## 2020-12-08 NOTE — Progress Notes (Signed)
Inpatient Rehabilitation Care Coordinator Discharge Note  The overall goal for the admission was met for:   Discharge location: Yes. D/c to home with 24/7 care with support from wife. HHSN to come to home today for IV abx education and administer medication at 3pm.   Length of Stay: Yes. 25 days.  Discharge activity level: Yes. Wheelchair level Min Assist. Requires CGA for bathing, functional transfers, and supervision for UB bathing/dressing. Pt requires min A for LB dressing and management of foley bag. Toileting with CGA.   Home/community participation: Yes. Limited.   Services provided included: MD, RD, PT, OT, SLP, RN, CM, TR, Pharmacy, Neuropsych and SW  Financial Services: Medicare and Private Insurance: [place] offered to/list presented to:Yes  Follow-up services arranged: Home Health: [place] for HHPT/OT/ST/SN/aide, DME: [employer] [facility] for wheelchair and Other: [facility] for IV abx rocephin until 5/24  Comments (or additional information):  Patient/Family verbalized understanding of follow-up arrangements: Yes  Individual responsible for coordination of the follow-up plan: contact pt wife [name] #[phone]  Confirmed correct DME delivered: Rana Snare 12/08/2020    Rana Snare

## 2020-12-08 NOTE — Progress Notes (Signed)
PROGRESS NOTE   Subjective:  Pt had 2 BMs overnight.  Got PICC line yesterday.   ROS:   Pt denies SOB, abd pain, CP, N/V/C/D, and vision changes   Objective:   [place] EKG SITE RITE  Result Date: 12/06/2020 If Site Rite image not attached, placement could not be confirmed due to current cardiac rhythm.  Recent Labs    12/06/20 0500  WBC 7.2  HGB 10.0*  HCT 30.8*  PLT 491*   Recent Labs    12/06/20 0500  NA 135  K 4.5  CL 100  CO2 29  GLUCOSE 153*  BUN 7*  CREATININE 0.50*  CALCIUM 8.7*    Intake/Output Summary (Last 24 hours) at 12/08/2020 0748 Last data filed at 12/08/2020 0511 Gross per 24 hour  Intake 200 ml  Output 1375 ml  Net -1175 ml     Pressure Injury 11/07/20 Heel Right Deep Tissue Pressure Injury - Purple or maroon localized area of discolored intact skin or blood-filled blister due to damage of underlying soft tissue from pressure and/or shear. (Active)  11/07/20 2000  Location: Heel  Location Orientation: Right  Staging: Deep Tissue Pressure Injury - Purple or maroon localized area of discolored intact skin or blood-filled blister due to damage of underlying soft tissue from pressure and/or shear.  Wound Description (Comments):   Present on Admission:     Physical Exam: Vital Signs Blood pressure 130/68, pulse 88, temperature 98.2 F (36.8 C), temperature source Oral, resp. rate 16, height 5' 11"  (1.803 m), weight 74 kg, SpO2 95 %.     General: awake, alert, appropriate, sitting up in bed;  NAD HENT: conjugate gaze; oropharynx moist CV: regular rate; no JVD Pulmonary: CTA B/L; no W/R/R- good air movement GI: soft, NT, ND, (+)BS Psychiatric: appropriate; still depressed, but slightly better Neurological: Ox3  Skin: PICC line RUE- looks good.  Musculoskeletal:  Cervical back: Normal range of motionand neck supple.  Comments: RLE staples out.   UE's 5-/5 but equal B/L  LE's  4+/5 in HF, KE, DF and PF B/L  Skin: General: Skin is warmand dry. Low back B/L has excoriations from scratching and red/slightly excoriated L hamstring area.  Comments: RLE incisions as well as abd incisions already noted- staples out.  Intact to light touch in all 4 extremities     Assessment/Plan: 1. Functional deficits which require 3+ hours per day of interdisciplinary therapy in a comprehensive inpatient rehab setting.  Physiatrist is providing close team supervision and 24 hour management of active medical problems listed below.  Physiatrist and rehab team continue to assess barriers to discharge/monitor patient progress toward functional and medical goals  Care Tool:  Bathing    Body parts bathed by patient: Right arm,Left arm,Chest,Abdomen,Front perineal area,Right upper leg,Left upper leg,Left lower leg,Face,Buttocks,Right lower leg   Body parts bathed by helper: Buttocks,Right lower leg     Bathing assist Assist Level: Supervision/Verbal cueing     Upper Body Dressing/Undressing Upper body dressing   What is the patient wearing?: Pull over shirt    Upper body assist Assist Level: Independent    Lower Body Dressing/Undressing Lower body dressing      What  is the patient wearing?: Pants     Lower body assist Assist for lower body dressing: Minimal Assistance - Patient > 75%     Toileting Toileting    Toileting assist Assist for toileting: Supervision/Verbal cueing     Transfers Chair/bed transfer  Transfers assist     Chair/bed transfer assist level: Minimal Assistance - Patient > 75%     Locomotion Ambulation   Ambulation assist   Ambulation activity did not occur: Safety/medical concerns  Assist level: 2 helpers Assistive device: Walker-rolling Max distance: 25'   Walk 10 feet activity   Assist  Walk 10 feet activity did not occur: Safety/medical concerns  Assist level: 2 helpers Assistive device: Walker-rolling   Walk  50 feet activity   Assist Walk 50 feet with 2 turns activity did not occur: Safety/medical concerns         Walk 150 feet activity   Assist Walk 150 feet activity did not occur: Safety/medical concerns         Walk 10 feet on uneven surface  activity   Assist Walk 10 feet on uneven surfaces activity did not occur: Safety/medical concerns         Wheelchair     Assist Will patient use wheelchair at discharge?: Yes Type of Wheelchair: Manual Wheelchair activity did not occur: Safety/medical concerns  Wheelchair assist level: Supervision/Verbal cueing Max wheelchair distance: 200'    Wheelchair 50 feet with 2 turns activity    Assist    Wheelchair 50 feet with 2 turns activity did not occur: Safety/medical concerns   Assist Level: Supervision/Verbal cueing   Wheelchair 150 feet activity     Assist  Wheelchair 150 feet activity did not occur: Safety/medical concerns   Assist Level: Supervision/Verbal cueing   Blood pressure 130/68, pulse 88, temperature 98.2 F (36.8 C), temperature source Oral, resp. rate 16, height 5' 11"  (1.803 m), weight 74 kg, SpO2 95 %.  Medical Problem List and Plan: 1.L frontal craniotomysecondary to LDH/SAH from fall -patient may Shower if incisions covered -ELOS/Goals: 2-3 weeks- mod I to supervision          -continue PT and OT- explained to pt, they are concerned about "early d/c"_ that if he hasn't made enough gains, can prolong stay.   5/10- con't PT and OT- wife coming for family training today  5/11- d/c today- wife "pride" is an issue- to help?  2. Impaired mobility: -DVT/anticoagulation: Pharmaceutical: Continue Lovenox -antiplatelet therapy: N/a 3. Low back pain: continue air mattress. Resolved. Dicussed XR findings of degeneration.   4/25- was having back pain- and R knee pain- if doesn't improve, will schedule Am pain meds  5/2- pain doing OK- better in knees  especially- con't regimen  5/6- pain was worse in R knee this AM- buckled slightly when on toilet- con't pain regimen for now  5/10- pain controlled per pt- con't regimen 4. Mood: LCSW to follow for evaluation and support.  5/5- starting Celexa 20 mg daily- doesn't feel theres "any reason to live".    5/6- pt happy that "he's not the only one" with depression in this situation- OK with Celexa- might need Remeron if appetite doesn't pick up?  5/7- still very depressed- takes a few days to kick in- pt very frustrated- will monitor  5/10- pt feeling a little bette-r but frustrated with wife about leg bag issue- con't regimen -antipsychotic agents: N/A 5. Neuropsych: This patient is capable of making decisions on his own behalf. 6. Multiple deep tissue  injuries/Skin/Wound Care: Continue Air mattress overlay with pressure relief measures.  --Continue vitamins and collagen to promote wound healing  7. Fluids/Electrolytes/Nutrition: Monitor I/O. Check lytes in am. 8. SDH (concerns of bacterial infection): S/p Burr hole evacuation  9. Strep anginosis bacteremia: Continue Ceftriaxone/Metronidazole X 6 weeks w/end date  10 Liver abscess s/p JP drain placement: Continue flushes TID and document output.   -IR is following.  4/28- getting CT scan on abd/pelvis- with contrast- this AM- pending results-   4/29- looks a little better- con't IV ABX-   5/2- JP drain removed!- con't wound care daily and prn 11. New onset seizures: Continue Keppra bid till follow up with neuro on outpatient basis.  12. Left hip ORIF: WBAT. Will consult ortho to eval given worsening pain. Added MS contin for better pain control/participation with therapy. Appears to have helped, continue.   4/26- will remove staples 13. T2DM: Hgb A1C- 10.8. Will monitor BS ac/hs. Home regimen Levemir 10u am/15u pm with SSI.  --continue Levemir 13u bid with SSI and titrate insulin as indicated.              -4/23 improved control  4/25- BGs 68-256 in last 24 hours- much more labile today- if still an issue tomorrow, will ask DM coordinators.   5/7- BGs doing overall better, because not eating- will wait to call Dm coordinator  5/9-  BGs much better- continue regimen  5/10- eating better- will increase 70/30 slightly to 19 units BID 14. BPH/Urinary retention: Foley replaced--keep in place for decompression for few days then start bladder program next week?.  --will increase flomax to 0.8 mg/HS  5/3- is still being cathed- has not been able to void- will check with family if want him to go home with foley vs in/out caths-     5/5- will place foley- have nurse let me know when to place. Will go home with it.    5/6- will make f/u with Urology- also continue Flomax since might void in future.  5/10- pt insists on leg bag- spoke with nurse about this- she will help him get some  15. Pancreatic cancer with recurrence: Continue Creon with meals. --follow up with Hem/onc and Radiation Onc after discharge.  -wife asking about plans for radiation/chemo: will contact oncology to ask them to discuss with wife.  4/28- to start radiation 11/30/20 -is set up. At 1:30pm  5/2- asked SW to make sure therapy doesn't schedule during radiation- or afterwards  5/2- radiation got pushed to next week- will see if needs to do before d/c.  5/5- let them know/radiation that has d/c next week.  5/6- pushed radiation back.    5/8- weight down even more- will discuss starting supplements.   5/10- added Boost/Glucerna for lower weight 16. Low back pain/muscle spasms- will start Robaxin prn and lidoderm patches 1- 8pm to 8am.              4/16: denies pain: decrease robaxin to q12H prn.  17. Hypokalemia: scheduled supplement and repeat BMP Monday 18. Overweight BMI 25.52: provide counseling.  19. Elevated alk phos: increasing,  Oncology notified. Oncology has discussed plans with wife for post- CIR  follow-up.  20. Right sided pleural effusion: could be contributing to his chest pain. Consulted IR regarding drainage/pleural studies given active cancer. ADDENDUM: s/p successful thoracentesis  -pt denies any SOB  4/25- VRE grew from Pleural effusion- ID to see today- linezolid started.    4/26- seen by ID- will con't Linizolid  4/28- will check CMP/CBC/CRP/ESR qMonday.  4/29-  Pleural abscess has decreased in size per CT- con't ABX   5/2- WBC down to 6.7- con't regimen  5/9- don't know when IV ABX to stop- might need PICC  5/9: WBC normal- will check regarding abx duration  5/10- on IV ABX until 5/24 - getting PICC today.  5/11- PICC line to go home with- ID f/u  21. Hypertension with hypotension: continue to monitor BP TID  5/3- BP well controlled- con't regimen  5/4- had orthostatic hypotension yesterday- encouraged pt to drink (not drinking enough) and to take Midodrine 5 mg TID prn for low BP. n  5/6- had another episode of hypotension yesterday- will schedule 2.5 mg TID Midodrine- since never know when it will occur- also encouraged PO/liquid intake.   5/8- increased midodrine to 5 mg TID.   5/9: BP labile- continue midodrine, add abdominal binder with therapy  5/10- slightly better today- con't regimen and TEDs 22. Loose stools in setting of previous constipation  5/5- had 3 loose BMs this AM- if continues, might need to check C Diff vs imodium? Will monitor  5/6- better- con't to monitor 23. Nausea/Vomiting  5/7- getting worse per pt- and "losing weight" per pt- will order daily weights; and called GI- they suggested reglan 10 mg TID for now and will see him when possible.   5/8- nausea resolved today- con't reglan for 3 days. 24. Dispo  5/11- d/c today     LOS: 26 days A FACE TO FACE EVALUATION WAS PERFORMED  Francisca Langenderfer 12/08/2020, 7:48 AM

## 2020-12-08 NOTE — Discharge Instructions (Signed)
  Inpatient Rehab Discharge Instructions  Colton Bush Discharge date and time:  12/08/20  Activities/Precautions/ Functional Status: Activity: no lifting, driving, or strenuous exercise till cleared by MD Diet: regular diet Wound Care: Wash with soap and water, Keep wound clean and dry. Wear off loading shoes on your feet when in bed/chair.    Functional status:  ___ No restrictions     ___ Walk up steps independently _X__ 24/7 supervision/assistance   ___ Walk up steps with assistance ___ Intermittent supervision/assistance  ___ Bathe/dress independently ___ Walk with walker     _X__ Bathe/dress with assistance ___ Walk Independently    ___ Shower independently ___ Walk with assistance    ___ Shower with assistance _X__ No alcohol     ___ Return to work/school ________    Special Instructions:  COMMUNITY REFERRALS UPON DISCHARGE:    Home Health:   PT    OT    ST    RN    SNA                Agency: [facility]  Phone: [phone]  Medical Equipment/Items Ordered: wheelchair                                                 Agency/Supplier: [facility] [phone]  Medical Equipment/Items Ordered: [facility] (IV abx- Cefatrixone)  -                                                Agency/Supplier: [phone]   My questions have been answered and I understand these instructions. I will adhere to these goals and the provided educational materials after my discharge from the hospital.  Patient/Caregiver Signature _______________________________ Date __________  Clinician Signature _______________________________________ Date __________  Please bring this form and your medication list with you to all your follow-up doctor's appointments.

## 2020-12-08 NOTE — Patient Outreach (Signed)
[facility]) Care Management  12/08/2020  LEONELL LOBDELL 05/25/1956 [id number]   Follow up:  Referral from inpatient rehab center  Will alert [facility] office that referral should go to Dr. Alysia Penna which is in [facility] and [facility] practice.  Plan: Request office staff to re-route referral to Chronic Care Management team.  Natividad Brood, RN BSN [facility] Liaison  [phone] business mobile phone Toll free office [phone]  Fax number: [phone] [email or web address]

## 2020-12-08 NOTE — Progress Notes (Signed)
Patient ID: CHANDAN FLY, male   DOB: 1956/01/28, 65 y.o.   MRN: [id number]  SW received updates from [facility] reporting that a nurse will come out to the home between 2pm-4pm for education. SW called pt wife [name] [phone]) stating that she spoke with someone about coming in at [place] informed will follow-up if [facility] is able to meet with her since we did not hear back from her yesterday.   SW met with pt and pt wife [name] in room to discuss nurse coming out to the home today between 2pm-4pm as [facility] is currently in a meeting and unsure if she is able to make it here before 11am.   D/c packet left at front desk.   Loralee Pacas, MSW, [place] Office: [phone] Cell: [phone] Fax: [phone]

## 2020-12-09 ENCOUNTER — Ambulatory Visit: Payer: Medicare Other | Admitting: Radiation Oncology

## 2020-12-09 ENCOUNTER — Emergency Department (HOSPITAL_COMMUNITY)
Admission: EM | Admit: 2020-12-09 | Discharge: 2020-12-10 | Disposition: A | Discharge status: Home / Self Care | Payer: Medicare Other | Attending: Emergency Medicine | Admitting: Emergency Medicine

## 2020-12-09 ENCOUNTER — Emergency Department (HOSPITAL_COMMUNITY): Payer: Medicare Other

## 2020-12-09 ENCOUNTER — Other Ambulatory Visit: Payer: Self-pay

## 2020-12-09 ENCOUNTER — Telehealth: Payer: Self-pay | Admitting: *Deleted

## 2020-12-09 DIAGNOSIS — Z452 Encounter for adjustment and management of vascular access device: Secondary | ICD-10-CM | POA: Diagnosis not present

## 2020-12-09 DIAGNOSIS — K8689 Other specified diseases of pancreas: Secondary | ICD-10-CM | POA: Diagnosis not present

## 2020-12-09 DIAGNOSIS — D638 Anemia in other chronic diseases classified elsewhere: Secondary | ICD-10-CM

## 2020-12-09 DIAGNOSIS — I5032 Chronic diastolic (congestive) heart failure: Secondary | ICD-10-CM | POA: Insufficient documentation

## 2020-12-09 DIAGNOSIS — B9620 Unspecified Escherichia coli [E. coli] as the cause of diseases classified elsewhere: Secondary | ICD-10-CM | POA: Diagnosis not present

## 2020-12-09 DIAGNOSIS — R531 Weakness: Secondary | ICD-10-CM | POA: Diagnosis not present

## 2020-12-09 DIAGNOSIS — C259 Malignant neoplasm of pancreas, unspecified: Secondary | ICD-10-CM | POA: Diagnosis not present

## 2020-12-09 DIAGNOSIS — R4182 Altered mental status, unspecified: Secondary | ICD-10-CM | POA: Diagnosis not present

## 2020-12-09 DIAGNOSIS — Z20822 Contact with and (suspected) exposure to covid-19: Secondary | ICD-10-CM | POA: Diagnosis not present

## 2020-12-09 DIAGNOSIS — B961 Klebsiella pneumoniae [K. pneumoniae] as the cause of diseases classified elsewhere: Secondary | ICD-10-CM | POA: Diagnosis not present

## 2020-12-09 DIAGNOSIS — I110 Hypertensive heart disease with heart failure: Secondary | ICD-10-CM | POA: Insufficient documentation

## 2020-12-09 DIAGNOSIS — Z79899 Other long term (current) drug therapy: Secondary | ICD-10-CM | POA: Diagnosis not present

## 2020-12-09 DIAGNOSIS — J869 Pyothorax without fistula: Secondary | ICD-10-CM | POA: Diagnosis not present

## 2020-12-09 DIAGNOSIS — K6389 Other specified diseases of intestine: Secondary | ICD-10-CM | POA: Diagnosis not present

## 2020-12-09 DIAGNOSIS — B954 Other streptococcus as the cause of diseases classified elsewhere: Secondary | ICD-10-CM | POA: Diagnosis not present

## 2020-12-09 DIAGNOSIS — Z794 Long term (current) use of insulin: Secondary | ICD-10-CM | POA: Insufficient documentation

## 2020-12-09 DIAGNOSIS — E119 Type 2 diabetes mellitus without complications: Secondary | ICD-10-CM | POA: Diagnosis not present

## 2020-12-09 DIAGNOSIS — R000 Tachycardia, unspecified: Secondary | ICD-10-CM | POA: Diagnosis not present

## 2020-12-09 DIAGNOSIS — K838 Other specified diseases of biliary tract: Secondary | ICD-10-CM | POA: Diagnosis not present

## 2020-12-09 DIAGNOSIS — J439 Emphysema, unspecified: Secondary | ICD-10-CM | POA: Diagnosis not present

## 2020-12-09 DIAGNOSIS — K7689 Other specified diseases of liver: Secondary | ICD-10-CM | POA: Diagnosis not present

## 2020-12-09 DIAGNOSIS — K750 Abscess of liver: Secondary | ICD-10-CM | POA: Diagnosis not present

## 2020-12-09 DIAGNOSIS — E876 Hypokalemia: Secondary | ICD-10-CM

## 2020-12-09 LAB — COMPREHENSIVE METABOLIC PANEL
ALT: 50 U/L — ABNORMAL HIGH (ref 0–44)
AST: 86 U/L — ABNORMAL HIGH (ref 15–41)
Albumin: 3.1 g/dL — ABNORMAL LOW (ref 3.5–5.0)
Alkaline Phosphatase: 1715 U/L — ABNORMAL HIGH (ref 38–126)
Anion gap: 11 (ref 5–15)
BUN: 8 mg/dL (ref 8–23)
CO2: 24 mmol/L (ref 22–32)
Calcium: 9.1 mg/dL (ref 8.9–10.3)
Chloride: 100 mmol/L (ref 98–111)
Creatinine, Ser: 0.7 mg/dL (ref 0.61–1.24)
GFR, Estimated: >60 mL/min (ref >60)
Glucose, Bld: 338 mg/dL — ABNORMAL HIGH (ref 70–99)
Potassium: 4 mmol/L (ref 3.5–5.1)
Sodium: 135 mmol/L (ref 135–145)
Total Bilirubin: 0.3 mg/dL (ref 0.3–1.2)
Total Protein: 8.3 g/dL — ABNORMAL HIGH (ref 6.5–8.1)

## 2020-12-09 LAB — CBC WITH DIFFERENTIAL/PLATELET
Abs Immature Granulocytes: 0.07 10*3/uL (ref 0.00–0.07)
Basophils Absolute: 0.1 10*3/uL (ref 0.0–0.1)
Basophils Relative: 1 %
Eosinophils Absolute: 0 10*3/uL (ref 0.0–0.5)
Eosinophils Relative: 0 %
HCT: 33.7 % — ABNORMAL LOW (ref 39.0–52.0)
Hemoglobin: 10.9 g/dL — ABNORMAL LOW (ref 13.0–17.0)
Immature Granulocytes: 1 %
Lymphocytes Relative: 7 %
Lymphs Abs: 1 10*3/uL (ref 0.7–4.0)
MCH: 28.6 pg (ref 26.0–34.0)
MCHC: 32.3 g/dL (ref 30.0–36.0)
MCV: 88.5 fL (ref 80.0–100.0)
Monocytes Absolute: 1.2 10*3/uL — ABNORMAL HIGH (ref 0.1–1.0)
Monocytes Relative: 8 %
Neutro Abs: 12.2 10*3/uL — ABNORMAL HIGH (ref 1.7–7.7)
Neutrophils Relative %: 83 %
Platelets: 577 10*3/uL — ABNORMAL HIGH (ref 150–400)
RBC: 3.81 MIL/uL — ABNORMAL LOW (ref 4.22–5.81)
RDW: 17.5 % — ABNORMAL HIGH (ref 11.5–15.5)
WBC: 14.5 10*3/uL — ABNORMAL HIGH (ref 4.0–10.5)
nRBC: 0.2 % (ref 0.0–0.2)

## 2020-12-09 LAB — URINALYSIS, ROUTINE W REFLEX MICROSCOPIC
Bacteria, UA: NONE SEEN
Bilirubin Urine: NEGATIVE
Glucose, UA: >=500 mg/dL — AB
Hgb urine dipstick: NEGATIVE
Ketones, ur: 20 mg/dL — AB
Leukocytes,Ua: NEGATIVE
Nitrite: NEGATIVE
Protein, ur: 30 mg/dL — AB
Specific Gravity, Urine: 1.027 (ref 1.005–1.030)
pH: 5 (ref 5.0–8.0)

## 2020-12-09 LAB — CBG MONITORING, ED: Glucose-Capillary: 314 mg/dL — ABNORMAL HIGH (ref 70–99)

## 2020-12-09 NOTE — Telephone Encounter (Signed)
[facility] said they were unable to dispense his Flagyl because a previous order was sent to mail order pharmacy and ins would not allow the fill because "too early".

## 2020-12-09 NOTE — ED Provider Notes (Signed)
Emergency Medicine Provider Triage Evaluation Note  Colton Bush , a 65 y.o. male  was evaluated in triage.  Pt complains of generalized weakness. He was recently admitted to the hospital for a liver abscess and a subdural hematoma. He was discharged from rehab yesterday and states he feels too weak to be at home.   Review of Systems  Positive: Generalized weakness Negative: Fever, chest pain, cough, nvd, urinary sxs  Physical Exam  BP 133/85 (BP Location: Right Arm)   Pulse (!) 107   Temp 98.3 F (36.8 C) (Oral)   Resp 16   SpO2 97%  Gen:   Awake, no distress   Resp:  Normal effort  MSK:   Moves extremities without difficulty  Other:  No facial droop, clear speech, moving all extremities without any obvious asymmetric strength  Medical Decision Making  Medically screening exam initiated at 4:51 PM.  Appropriate orders placed.  SHYLO DILLENBECK was informed that the remainder of the evaluation will be completed by another provider, this initial triage assessment does not replace that evaluation, and the importance of remaining in the ED until their evaluation is complete.   Rodney Booze, PA-C 12/09/20 1658    Varney Biles, MD 12/10/20 1541

## 2020-12-09 NOTE — Discharge Summary (Signed)
Physician Discharge Summary  Patient ID: Colton Bush MRN: [id number] DOB/AGE: 1956/04/25 65 y.o.  Admit date: 11/12/2020 Discharge date: 12/08/2020  Discharge Diagnoses:  Principal Problem:   Subdural hematoma (Colton Bush) Active Problems:   Diabetes ([name])   Liver abscess   Bladder outlet obstruction   Toxic encephalopathy   Pain   Nausea & vomiting   Anemia of chronic illness   Hypokalemia   Discharged Condition: stable   Significant Diagnostic Studies: DG Chest 1 View  Result Date: 11/19/2020 CLINICAL DATA:  Status post thoracentesis. EXAM: CHEST  1 VIEW COMPARISON:  11/18/2020 FINDINGS: Near complete resolution of the right-sided pleural effusion. No evidence for right pneumothorax. Atelectasis noted right lung base. Minimal subsegmental atelectasis or linear scarring noted left lower lung. The cardiopericardial silhouette is within normal limits for size. The visualized bony structures of the thorax show no acute abnormality. Percutaneous drainage catheters overlie the right upper quadrant of the abdomen. IMPRESSION: Near complete resolution of right pleural effusion status post thoracentesis. No pneumothorax. Electronically Signed   By: Misty Stanley M.D.   On: 11/19/2020 12:14   DG Chest 2 View  Result Date: 12/09/2020 CLINICAL DATA:  Weakness EXAM: CHEST - 2 VIEW COMPARISON:  11/19/2020 FINDINGS: Cardiac shadow is within normal limits. Patchy airspace opacity is again seen in the right mid lung stable in appearance from the prior exam consistent with some loculated pleural fluid along the posterior margin. Right-sided PICC line is noted in satisfactory position at the cavoatrial junction. The left lung is clear. No bony abnormality is noted. IMPRESSION: Right-sided PICC line in satisfactory position. Vague density overlying the right mid lung which corresponds to posterior loculated fluid. Electronically Signed   By: Inez Catalina M.D.   On: 12/09/2020 17:37   DG Chest 2  View  Result Date: 11/18/2020 CLINICAL DATA:  Chest pain. EXAM: CHEST - 2 VIEW COMPARISON:  11/08/2020. FINDINGS: Borderline cardiomegaly. Diffuse right lung infiltrate. Moderate right pleural effusion. Mild atelectasis left lung base. No pneumothorax. Prior cervical spine fusion. IMPRESSION: 1.  Diffuse right lung infiltrate.  Moderate right pleural effusion. 2.  Mild left base subsegmental atelectasis. 3.  Borderline cardiomegaly. Electronically Signed   By: Marcello Moores  Register   On: 11/18/2020 15:18   DG Lumbar Spine 2-3 Views  Result Date: 11/16/2020 CLINICAL DATA:  Pain right flank EXAM: LUMBAR SPINE - 2-3 VIEW COMPARISON:  CT abdomen pelvis 08/31/2020 FINDINGS: Mild retrolisthesis L2-3 and L3-4. Diffuse disc degeneration with disc space narrowing and spurring throughout the lumbar spine. Mild lumbar kyphosis Negative for fracture or mass Pigtail drainage catheter right upper quadrant. Bowel staples in the region of the stomach. IMPRESSION: Multilevel lumbar degenerative change.  No acute abnormality. Electronically Signed   By: Franchot Gallo M.D.   On: 11/16/2020 16:45   DG Abd 1 View  Result Date: 12/02/2020 CLINICAL DATA:  Nausea and vomiting EXAM: ABDOMEN - 1 VIEW COMPARISON:  11/25/2020 FINDINGS: Scattered large and small bowel gas is noted. No obstructive changes are seen. No free air is noted. Postsurgical changes are noted in the right hip and mid abdomen. Degenerative changes of lumbar spine are seen. Bone island is noted within the left iliac bone. IMPRESSION: Postsurgical changes without acute abnormality. Electronically Signed   By: Inez Catalina M.D.   On: 12/02/2020 15:55   CT ABDOMEN PELVIS W CONTRAST  Result Date: 11/25/2020 CLINICAL DATA:  Intra-abdominal abscess, hepatic abscesses post percutaneous drainage. EXAM: CT ABDOMEN AND PELVIS WITH CONTRAST TECHNIQUE: Multidetector CT imaging of the  abdomen and pelvis was performed using the standard protocol following bolus administration  of intravenous contrast. CONTRAST:  152m OMNIPAQUE IOHEXOL 300 MG/ML  SOLN COMPARISON:  November 06, 2020 FINDINGS: Lower chest: Interval development of pleural fluid with peripheral enhancement in the RIGHT lung base. Basilar atelectasis. No LEFT-sided effusion. Pleural fluid measuring 9.6 x 4.4 cm. Hepatobiliary: Atrophic LEFT hepatic lobe with biliary duct dilation. The soft tissue extending into the hepatic hilum along the course of hepatic vasculature. The SMV and main portal vein remain patent with distortion of the portal vein as on recent imaging including [facility] which [facility] on August 31, 2020. Occlusion of intrahepatic LEFT portal vein is similar to prior imaging. There is some biliary duct dilation also affecting the RIGHT hepatic lobe to a lesser extent. Hepatic abscesses and or biloma is that were drained have resolved. Drainage catheters remain in place in the RIGHT hemi liver. Pancreas: Post Whipple procedure. No pancreatic ductal dilation. Soft tissue about the celiac and extending in the porta hepatis as described. Spleen: Spleen is normal. Adrenals/Urinary Tract: Adrenal glands are normal. Symmetric renal enhancement.  No hydronephrosis. Stomach/Bowel: Post Whipple with alteration of gastrointestinal anatomy as expected. No acute small bowel process. Colon is filled with liquid stool and is nondistended. Appendix is normal. Vascular/Lymphatic: No discrete adenopathy in the upper abdomen with soft tissue about the celiac axis. No retroperitoneal adenopathy. No mesenteric adenopathy. LEFT portal occlusion. Irregularity of the common hepatic artery and celiac axis due to surrounding ill-defined soft tissue. Reproductive: Unremarkable Other: No ascites.  No free air. Musculoskeletal: Spinal degenerative changes. Osteopenia. Post RIGHT femoral ORIF, recent postoperative changes with mild surrounding stranding, not unexpected and not significantly changed.  IMPRESSION: 1. Findings of empyema in the RIGHT chest with associated airspace disease. This has developed since prior imaging of April 9th of 2022 but is decreased in size based on comparison with chest x-ray from November 18, 2020 following aspiration. 2. Hepatic abscesses and or biloma, post drainage now resolved. 3. Post Whipple procedure with soft tissue about the celiac axis and common hepatic artery. Similar appearance of atrophy of the LEFT hepatic lobe. Findings are suspicious for disease recurrence with chronic occlusion of LEFT portal vein and extensive involvement of the celiac axis. 4. Post RIGHT femoral ORIF, recent postoperative changes with mild surrounding stranding, not unexpected and not significantly changed. These results will be called to the ordering clinician or representative by the Radiologist Assistant, and communication documented in the PACS or [employer] Electronically Signed   By: GZetta BillsM.D.   On: 11/25/2020 17:26   DG HIP UNILAT WITH PELVIS 2-3 VIEWS RIGHT  Result Date: 11/16/2020 CLINICAL DATA:  Follow-up hip fracture EXAM: DG HIP (WITH OR WITHOUT PELVIS) 3V RIGHT COMPARISON:  10/25/2020 FINDINGS: Medullary rod and proximal fixation screw are again seen and stable. Changes of prior proximal right femoral fracture are seen with fracture fragments in near anatomic alignment. No new focal abnormality is noted. IMPRESSION: Status post ORIF of proximal right femoral fracture. Electronically Signed   By: MInez CatalinaM.D.   On: 11/16/2020 21:16   [place] SITE RITE  Result Date: 12/06/2020 If Site Rite image not attached, placement could not be confirmed due to current cardiac rhythm.  IR THORACENTESIS ASP PLEURAL SPACE W/IMG GUIDE  Result Date: 11/19/2020 INDICATION: Patient with a history of pancreatic cancer presents today with pleural effusions. Interventional radiology asked to perform a therapeutic and diagnostic thoracentesis. EXAM: ULTRASOUND GUIDED  THORACENTESIS MEDICATIONS: 1% lidocaine 10 mL COMPLICATIONS: None immediate. PROCEDURE: An ultrasound guided thoracentesis was thoroughly discussed with the patient and questions answered. The benefits, risks, alternatives and complications were also discussed. The patient understands and wishes to proceed with the procedure. Written consent was obtained. Ultrasound was performed to localize and mark an adequate pocket of fluid in the right chest. The area was then prepped and draped in the normal sterile fashion. 1% Lidocaine was used for local anesthesia. Under ultrasound guidance a 6 Fr Safe-T-Centesis catheter was introduced. Thoracentesis was performed. The catheter was removed and a dressing applied. FINDINGS: A total of approximately 900 mL of amber-colored fluid was removed. Samples were sent to the laboratory as requested by the clinical team. IMPRESSION: Successful ultrasound guided right thoracentesis yielding 900 mL of pleural fluid. Read by: Soyla Dryer, NP Electronically Signed   By: Ruthann Cancer MD   On: 11/19/2020 12:28    Labs:  Basic Metabolic Panel: Recent Labs  Lab 12/05/20 0530 12/06/20 0500  NA 133* 135  K 3.9 4.5  CL 102 100  CO2 27 29  GLUCOSE 104* 153*  BUN 6* 7*  CREATININE 0.47* 0.50*  CALCIUM 8.3* 8.7*    CBC: Recent Labs  Lab 12/05/20 0530 12/06/20 0500  WBC 6.1 7.2  NEUTROABS 3.9 4.8  HGB 9.7* 10.0*  HCT 30.2* 30.8*  MCV 89.9 89.5  PLT 504* 491*    CBG: Recent Labs  Lab 12/07/20 1126 12/07/20 1620 12/07/20 2143 12/08/20 0638 12/09/20 1634  GLUCAP 284* 259* 94 149* 314*    Brief HPI:   REILLY BLADES is a 65 y.o. male with history of T2DM, HTN, pancreatic cancer with recurrence and recent admission 10/18/2020 for right hip fracture status post ORIF.  Hospital course was complicated by UTI with strep bacteremia and urinary retention.  He was discharged to SNF for rehab but readmitted on 04/08 with SIRS and found to have multiple liver  abscesses.  He underwent IR guided aspiration with drain placement and cultures positive for strep angiannosis. Urine culture positive for E. coli.  He was started on IV antibiotics but continued have issues with confusion.  MRI brain done revealing suspected areas of acute ischemia in left temporal and parietal lobe but with diffusion changes.  CT head revealed late subacute or chronic SDH measuring 10 mm in thickness.    On 04/11, he was noted to have two episodes of seizure type activity which resolved after he was loaded with Keppra.   Dr. Venetia Constable was consulted due to concerns of subdural hemorrhage versus empyema and patient was taken to the OR on 04/12 for bur hole evacuation.  Antibiotics changed to metronidazole and ceftriaxone on 04/12 as liver aspirate positive for Klebsiella and E. coli.  He did have significant improvement in speech and mentation postprocedure.  Dr. Leonel Ramsay recommended continuing [place] and to  follow-up with outpatient neurology for input on wean.  Foley was removed but he was unable to void therefore this was replaced.  Therapy was initiated and patient was noted to be limited by weakness affecting mobility and ADLs.  CIR was recommended due to functional decline.    Hospital Course: MILLION MAHARAJ was admitted to rehab 11/12/2020 for inpatient therapies to consist of PT, ST and OT at least three hours five days a week. Past admission physiatrist, therapy team and rehab RN have worked together to provide customized collaborative inpatient rehab.  He continued to have issues with pain control affecting mobility.  MS Contin was scheduled in the morning with as needed doses during the day which has helped improved his overall functional status.  Follow-up films of right hip showed stable hardware.  He was maintained on air mattress overlay due to deep tissue injuries and for pressure-relief measures.  He did report episode of chest pain with activity on 04/21 and chest x-ray  done revealing right pleural effusion.  This was tapped for 900 cc of pleural fluid and grew out VRE.  ID recommended 2-week course of Zyvox which was completed prior to discharge.  Drainage from JP was monitored with TID flushes and showed steady decrease and radiology has been following for input.  CT scan abdomen pelvis was done on 4/28 showed that hepatic abscess and/or biloma has resolved and drains were removed on 04/29. His po intake has been improving but he has had bouts of nausea. KUB was negative for obstipation or ileus. Creon was increased and simethicone added to help  Manage bloating. Dr. Bryan Lemma consulted due to ongoing symptoms and recommended a Reglan for 72 hours which was then decreased to twice daily and then as needed.  Patient GI symptoms were improving prior to discharge.  His diabetes has been monitored with ac/hs CBG checks and SSI was use prn for tighter BS control.  70/30 insulin was titrated upwards for tighter control as intake was improved.  He was on Levemir prior to admission and preferred to go back to Levemir with sliding scale insulin at discharge.  He has failed 2 voiding trials despite increase of Flomax to 0.8 mg.  He and his wife unable to self cath and elected on indwelling Foley for management of urinary retention.  He is set to follow-up with urology for voiding trial in a couple of weeks.  Patient and wife have had discussions with Dr.Feng regarding further treatment and plans to start XRT after discharge. Serial check of labs showed hypokalemia which has resolved with addition of Kdur for supplementation. A phos has continued to fluctuate from 200-700 range.  Follow up CBC showed that anemia of chronic disease is slowly improving and reactive thrombocytosis is resolving.   Blood pressures were monitored on 3 times daily basis and he continued to have issues with orthostatic hypotension with increase in activity.  Teds were ordered for BP support, midodrine was  scheduled and titrated up to 5 mg 3 times daily.  He was maintained on Flagyl and ceftriaxone during his stay.  PICC line was placed on 05/11 and family has been educated on IV antibiotic.  He did report depressed mood regarding issues with slow progress as well as concerns about further follow-up treatment for his cancer.  He was started on Celexa for mood stabilization.  Dr. Sima Matas neuropsychology has also been following for support.  He has made progress during his stay however this varies depending on BP changes.  He has progressed to min assist level and wheelchair is recommended for use at home.  He will continue to receive follow-up home health PT, OT, ST, aide and RN by [facility] after discharge.   Rehab course: During patient's stay in rehab weekly team conferences were held to monitor patient's progress, set goals and discuss barriers to discharge. At admission, patient required max assist for ADL tasks and for bed mobility.  He required mechanical lift device for transfers. He exhibited mild to moderate cognitive deficits with increased time for processing and had decreased awareness of errors. He  has had improvement in  activity tolerance, balance, postural control as well as ability to compensate for deficits.  He currently requires min assist for ADL tasks.  And contact-guard assist for toileting.  He requires min assist for transfers and to ambulate 25 feet with rolling walker.  He is able to climb 1 step with mod assist with 2 handrails. He requires supervision for mildly complex task and continues to have decreased processing speed.  Family education was completed with wife.   Discharge disposition: 01-Home or Self Care  Diet: Carb modified.  Special Instructions: 1.  Antibiotics to continue through 05/24. 2. Weekly labs per ID protocol. 3. Monitor BS ac/hs and continue to use SSI per scale.   Discharge Instructions    [facility] pharmacist to adjust dose for  Vancomycin, Aminoglycosides and other anti-infective therapies as requested by physician.   Complete by: As directed    [facility] to provide Cath Flo 97m   Complete by: As directed    Administer for PICC line occlusion and as ordered by physician for other access device issues.   Anaphylaxis Kit: Provided to treat any anaphylactic reaction to the medication being provided to the patient if First Dose or when requested by physician   Complete by: As directed    Epinephrine 170mml vial / amp: Administer 0.7m49m0.7ml75mubcutaneously once for moderate to severe anaphylaxis, nurse to call physician and pharmacy when reaction occurs and call 911 if needed for immediate care   Diphenhydramine 50mg34mIV vial: Administer 25-50mg 84mM PRN for first dose reaction, rash, itching, mild reaction, nurse to call physician and pharmacy when reaction occurs   Sodium Chloride 0.9% NS 500ml I47mdminister if needed for hypovolemic blood pressure drop or as ordered by physician after call to physician with anaphylactic reaction   Change dressing on IV access line weekly and PRN   Complete by: As directed    Flush IV access with Sodium Chloride 0.9% and Heparin 10 units/ml or 100 units/ml   Complete by: As directed    Home infusion instructions - [facility]   Complete by: As directed    Instructions: Flush IV access with Sodium Chloride 0.9% and Heparin 10units/ml or 100units/ml   Change dressing on IV access line: Weekly and PRN   Instructions Cath Flo 2mg: Ad52mister for PICC Line occlusion and as ordered by physician for other access device   [facility] pharmacist to adjust dose for: Vancomycin, Aminoglycosides and other anti-infective therapies as requested by physician   Method of administration may be changed at the discretion of home infusion pharmacist based upon assessment of the patient and/or caregiver's ability to self-administer the medication ordered   Complete by:  As directed      Allergies as of 12/08/2020   No Known Allergies     Medication List    STOP taking these medications   Creon 6000-19000 units Cpep Generic drug: Pancrelipase (Lip-Prot-Amyl) Replaced by: Pancrelipase (Lip-Prot-Amyl) 24000-76000 units Cpep   enoxaparin 40 MG/0.4ML injection Commonly known as: LOVENOX   ferrous sulfate 325 (65 FE) MG tablet   Fish Oil 500 MG Caps   rosuvastatin 20 MG tablet Commonly known as: Crestor     TAKE these medications   acetaminophen 325 MG tablet Commonly known as: TYLENOL Take 1-2 tablets (325-650 mg total) by mouth every 4 (four) hours as needed for mild pain.   ascorbic acid 500 MG tablet Commonly known as: VITAMIN C Take 1 tablet (500 mg total) by mouth daily.  cefTRIAXone  IVPB Commonly known as: ROCEPHIN Inject 2 g into the vein every 12 (twelve) hours. Indication:  liver abscess and possible infected subdural hematoma First Dose: No Last Day of Therapy:  12/21/20 Labs - Once weekly:  CBC/D and BMP, Labs - Every other week:  ESR and CRP Method of administration: IV Push Method of administration may be changed at the discretion of home infusion pharmacist based upon assessment of the patient and/or caregiver's ability to self-administer the medication ordered.   citalopram 20 MG tablet Commonly known as: CELEXA Take 1 tablet (20 mg total) by mouth daily.   diclofenac Sodium 1 % Gel Commonly known as: VOLTAREN Apply 2 g topically 4 (four) times daily. To right knee   hydrocerin Crea Apply 1 application topically 2 (two) times daily. To legs and back Notes to patient: Over the counter   HYDROcodone-acetaminophen 5-325 MG tablet--Rx # 30 Commonly known as: NORCO/VICODIN Take 1 tablet by mouth every 3 (three) hours as needed for severe pain. Notes to patient: Limit to twice a day as needed   hydrocortisone cream 1 % Apply topically 2 (two) times daily. To back/areas of itching Notes to patient: Over the  counter--can change to as needed as itching resolves   insulin detemir 100 UNIT/ML FlexPen Commonly known as: LEVEMIR Inject 15 Units into the skin 2 (two) times daily. What changed: how much to take   levETIRAcetam 1000 MG tablet Commonly known as: KEPPRA Take 1 tablet (1,000 mg total) by mouth 2 (two) times daily.   lidocaine 5 % Commonly known as: LIDODERM Place 1 patch onto the skin daily. Apply to lower back at 8 pm and remove at 8 am daily Notes to patient: Can purchase over the counter as insurance usually does not cover this   loratadine 10 MG tablet Commonly known as: CLARITIN Take 1 tablet (10 mg total) by mouth daily. Notes to patient: Over the counter   metroNIDAZOLE 500 MG tablet Commonly known as: FLAGYL Take 1 tablet (500 mg total) by mouth every 8 (eight) hours for 15 days. Treat through 12/21/20 What changed: when to take this Notes to patient: Take at 7 am, 2 pm and bedtime.    midodrine 5 MG tablet Commonly known as: PROAMATINE Take 1 tablet (5 mg total) by mouth 3 (three) times daily with meals. Notes to patient: Take at 7 am, noon and 4 pm daily to help support blood pressure.    morphine 30 MG 12 hr tablet--Rx# 60 pills Commonly known as: MS CONTIN Take 1 tablet (30 mg total) by mouth daily. Notes to patient: Long acting pain medication   multivitamin with minerals Tabs tablet Take 1 tablet by mouth daily.   NovoLOG FlexPen 100 UNIT/ML FlexPen Generic drug: insulin aspart Inject 2-13 Units into the skin 3 (three) times daily with meals. Sliding Scale insulin for blood sugars from 101-150 - 2 units. For blood sugars from 151-200 - 3 units. For blood sugars from 201-250 - 5 units. For blood sugars from 301-350 use 7 units. Call MD if blood sugars are over 300. What changed: See the new instructions.   omeprazole 20 MG capsule Commonly known as: PRILOSEC TAKE 1 CAPSULE DAILY   ondansetron 4 MG disintegrating tablet Commonly known as:  ZOFRAN-ODT Take 1 tablet (4 mg total) by mouth every 6 (six) hours as needed for nausea or vomiting (can alternate with compazine).   Pancrelipase (Lip-Prot-Amyl) 24000-76000 units Cpep Take 1 capsule (24,000 Units total) by mouth 3 (three)  times daily before meals. Replaces: Creon 6000-19000 units Cpep Notes to patient: To help with digestion   Pen Needles 32G X 6 MM Misc 1 application by Does not apply route as needed.   polycarbophil 625 MG tablet Commonly known as: FIBERCON Take 1 tablet (625 mg total) by mouth 2 (two) times daily. Notes to patient: To bulk stools--purchase over the counter.    polyethylene glycol 17 g packet Commonly known as: MIRALAX / GLYCOLAX Take 17 g by mouth daily as needed for mild constipation.   potassium chloride 20 MEQ packet Commonly known as: KLOR-CON Take 40 mEq by mouth daily.   simethicone 80 MG chewable tablet Commonly known as: MYLICON Chew 1 tablet (80 mg total) by mouth 4 (four) times daily -  with meals and at bedtime. Notes to patient: For gas--purchase over the counter   tamsulosin 0.4 MG Caps capsule Commonly known as: FLOMAX Take 2 capsules (0.8 mg total) by mouth daily. What changed: how much to take   zinc sulfate 220 (50 Zn) MG capsule Take 1 capsule (220 mg total) by mouth daily. Notes to patient: To help wound heal--purchase over the counter.             Discharge Care Instructions  (From admission, onward)         Start     Ordered   12/06/20 0000  Change dressing on IV access line weekly and PRN  (Home infusion instructions - [facility] )        12/06/20 0852          Follow-up Information    Lovorn, Jinny Blossom, MD Follow up.   Specialty: Physical Medicine and Rehabilitation Why: office will call you with follow up appointment Contact information: [address] [place] [address] [phone]        Rod Can, MD Follow up.   Specialty: Orthopedic Surgery Contact  information: [address] [place] [address] [place] [address] [phone]        Truitt Merle, MD Follow up.   Specialties: Hematology, Oncology Contact information: [place] [address] [phone]        Kyung Rudd, MD Follow up.   Specialty: Radiation Oncology Contact information: [address] [place] [address] [phone]        Van Dam, Cornelius N, MD Follow up on 12/10/2020.   Specialty: Infectious Diseases Why: Be there at 3:45 pm Contact information: [address] [place] [address] [phone]        Janith Lima, MD Follow up on 01/05/2021.   Specialty: Urology Why: Be there at 9 am for 9:15 appointment Contact information: [place]  [address] [phone]               Signed: JAQUALIN SERPA 12/09/2020, 5:57 PM

## 2020-12-09 NOTE — Telephone Encounter (Signed)
Colton Bush called because he says that he does not have two of medications flagyl and fibercon.  According to the med list they were sent to the pharmacy and (pharmacy receipt noted).  He reports that he has been unable to eat today with dry heaves and feeling excessively weak.  He is a diabetic on insulin (he has taken his insulin today) so I asked about his blood sugars and he said they had not been able to check it. They tried 6 times and kept getting an error message.  He was able to tell me that it was error code 6 and they said it was an accucheck  So I googled error code 6 and it says the blood was being placed on test strip before the machine had a chance to be ready. I told him to recheck it and I would call [facility] and see if they could send someone out to see him. I spoke with his nurse Sharyn Lull who had seen him earlier to teach his wife to do the rocephin IV but she or the other after hours did not have any availability to see him.  Due to concern over his not eating and taking his insulin, I advised him to go to the ED. If his wife is unable to take him then he should call 911. They will get him there. I also let Algis Liming PA know of him returning to the ED.

## 2020-12-09 NOTE — ED Triage Notes (Signed)
Pt was released from hospital yesterday, has not ate well, has been weak and tired and felt like he needed to return. Pt is weak and does not feel like wife can take care of him at home.

## 2020-12-10 ENCOUNTER — Telehealth: Payer: Self-pay

## 2020-12-10 ENCOUNTER — Ambulatory Visit: Payer: Medicare Other | Admitting: Radiation Oncology

## 2020-12-10 ENCOUNTER — Ambulatory Visit: Payer: Medicare Other | Admitting: Infectious Disease

## 2020-12-10 ENCOUNTER — Encounter (HOSPITAL_COMMUNITY): Payer: Self-pay | Admitting: Emergency Medicine

## 2020-12-10 ENCOUNTER — Emergency Department (HOSPITAL_COMMUNITY): Payer: Medicare Other

## 2020-12-10 DIAGNOSIS — K8689 Other specified diseases of pancreas: Secondary | ICD-10-CM | POA: Diagnosis not present

## 2020-12-10 DIAGNOSIS — R531 Weakness: Secondary | ICD-10-CM | POA: Diagnosis not present

## 2020-12-10 DIAGNOSIS — R4182 Altered mental status, unspecified: Secondary | ICD-10-CM | POA: Diagnosis not present

## 2020-12-10 DIAGNOSIS — K838 Other specified diseases of biliary tract: Secondary | ICD-10-CM | POA: Diagnosis not present

## 2020-12-10 DIAGNOSIS — K7689 Other specified diseases of liver: Secondary | ICD-10-CM | POA: Diagnosis not present

## 2020-12-10 DIAGNOSIS — K6389 Other specified diseases of intestine: Secondary | ICD-10-CM | POA: Diagnosis not present

## 2020-12-10 LAB — RESP PANEL BY RT-PCR (FLU A&B, COVID) ARPGX2
Influenza A by PCR: NEGATIVE
Influenza B by PCR: NEGATIVE
SARS Coronavirus 2 by RT PCR: NEGATIVE

## 2020-12-10 MED ORDER — ONDANSETRON 4 MG PO TBDP
4.0000 mg | ORAL_TABLET | Freq: Once | ORAL | Status: AC
  Administered 2020-12-10: 4 mg via ORAL
  Filled 2020-12-10: qty 1

## 2020-12-10 MED ORDER — IOHEXOL 300 MG/ML  SOLN
75.0000 mL | Freq: Once | INTRAMUSCULAR | Status: AC | PRN
  Administered 2020-12-10: 75 mL via INTRAVENOUS

## 2020-12-10 NOTE — Discharge Instructions (Addendum)
Continue home medications including IV Rocephin Please call your primary care doctor for follow-up and recheck of your liver function tests. Your lung Infection appears stable. Your liver abscess is resolved Please continue all home medications as prescribed.

## 2020-12-10 NOTE — Telephone Encounter (Signed)
SW received phone call from [employer] at [facility] who reported pt wife was in her office and the husband was in the car and wanted to discuss options for them. SW informed no longer his current Education officer, museum. Discussed how [facility] is involved in pt care needs for HHPT/OT/aide/SN, and recent updates included pt will transition to oral antibiotics versus IV abx. SNF placement is not an option as pt is outside of his 30 day window. SW informed on how she can take pt to emergency room and discuss her challenges with caring for him at home, however, pt would require to be out of the hospital for 60 days for his SNF time to reset, in which by Medicare guildelines he will have 20 days covered at no cost. Narda Rutherford reports that [place] supplement will not cover any of the expenses unless Medicare pays. SW discussed alernate options such as utilizing the home health agency to see what other services they can aide with, and/or hire private sitter to assist with pt care needs in the home.SW informed will follow-up with [facility] about current issues.   SW spoke with [facility] liaison to inform on above. SW asked about SW referral. Requires verbal order from physician. SW encouraged verbal order and will follow-up if there is an issue.   SW discussed with Pam PA on above and she is agreeable to verbal order for SW. SW updated Ramond Marrow on approval. Ramond Marrow states pt will be scheduled for MSW eval, and pt also have scheduled visits tomorrow.

## 2020-12-10 NOTE — ED Notes (Signed)
Patient verbalizes understanding of discharge instructions. Opportunity for questioning and answers were provided. Armband removed by staff, pt discharged from ED via wheelchair to lobby to return home with spouse.   

## 2020-12-10 NOTE — ED Provider Notes (Signed)
[place] [facility] EMERGENCY DEPARTMENT Provider Note   CSN: [id number] Arrival date & time: 12/09/20  1626     History Chief Complaint  Patient presents with  . Weakness    Colton Bush is a 65 y.o. male.  HPI  65 year old male discharged from rehab on May 11 after admission for subdural hematoma, liver abscess, sepsis, history of recurrent pancreatic cancer, type 2 diabetes, patient originated SNF, hip fracture, status postrepair, presents today with reports that he is weak at home.  He was discharged from rehab.  He went home with his wife.  He states his wife has short-term memory problems.  He did have some home health care coming in.  He has a In his right upper extremity and was discharged home on Rocephin for his liver abscess     Past Medical History:  Diagnosis Date  . Arthritis    left hand  . Bronchitis 1977  . Cancer ([place]) 03/09/2016   pancreatic cancer, sees Dr. Cristino Martes at [facility]   . Depression    takes Cymbalta daily  . Diabetes mellitus type II    sees Dr. Chalmers Cater   . GERD (gastroesophageal reflux disease)    takes Omeprazole daily  . H/O hiatal hernia   . Hyperlipidemia    takes Zocor daily  . Hypertension    takes Amlodipine daily  . Hypoglycemia 06/18/2017  . Neck pain    C4-7 stenosis and herniated disc  . Neuromuscular disorder ([place])    hiatal hernia  . Scoliosis    slight  . Spinal cord injury, C5-C7 ([place])    c4-c7  . Stiffness of hand joint    d/t cervical issues    Patient Active Problem List   Diagnosis Date Noted  . Anemia of chronic illness 12/09/2020  . Hypokalemia 12/09/2020  . Nausea & vomiting   . Pain   . Bladder outlet obstruction 11/12/2020  . Toxic encephalopathy 11/12/2020  . Atrophic pancreas 11/12/2020  . Subdural hematoma ([place])   . Pneumonia of both lower lobes due to infectious organism 11/06/2020  . Liver abscess 11/06/2020  . Sepsis ([place]) 11/05/2020  . Uncontrolled type 2 diabetes mellitus with  ketoacidosis without coma, with long-term current use of insulin ([place]) 11/05/2020  . Increased anion gap metabolic acidosis 05/24/8526  . Chronic diastolic CHF (congestive heart failure) ([place]) 11/05/2020  . Closed right hip fracture, initial encounter ([place]) 10/18/2020  . Fall from ground level 10/18/2020  . Hypoglycemia 06/19/2017  . Hypothermia 06/19/2017  . Goals of care, counseling/discussion 09/29/2016  . Port catheter in place 04/11/2016  . Hypercalcemia 03/29/2016  . Adenocarcinoma of head of pancreas ([place]) 03/17/2016  . Biliary obstruction   . Obstructive jaundice due to malignant neoplasm ([place]) 02/27/2016  . DKA (diabetic ketoacidosis) ([place]) 02/27/2016  . Mixed diabetic hyperlipidemia associated with type 2 diabetes mellitus ([place]) 05/05/2014  . Cervical spondylosis with myelopathy 08/18/2011  . CERUMEN IMPACTION 12/14/2008  . Diabetes ([place]) 09/16/2007  . Hyperlipidemia, mixed 09/16/2007  . Essential hypertension 09/16/2007  . GERD without esophagitis 09/16/2007  . ESOPHAGEAL STRICTURE 04/04/2007  . HIATAL HERNIA 04/04/2007    Past Surgical History:  Procedure Laterality Date  . ANTERIOR CERVICAL DECOMP/DISCECTOMY FUSION  08/18/2011   Procedure: ANTERIOR CERVICAL DECOMPRESSION/DISCECTOMY FUSION 3 LEVELS;  Surgeon: Winfield Cunas, MD;  Location: [facility];  Service: Neurosurgery;  Laterality: N/A;  Anterior Cervical Four-Five/Five-Six/Six-Seven Decompression with Fusion, Plating, and Bonegraft  . BURR HOLE Left 11/09/2020   Procedure: LEFT  BURR HOLES FOR EVACUATION OF Subdural Hematoma;  Surgeon: Judith Part, MD;  Location: [facility];  Service: Neurosurgery;  Laterality: Left;  . CARPAL TUNNEL RELEASE  2013   bilateral, per Dr. Christella Noa   . COLONOSCOPY  10-30-14   per Dr. Olevia Perches, clear, repeat in 10 yrs   . egd with esophageal dilation  9-08   per Dr. Olevia Perches  . ERCP N/A 03/01/2016   Procedure: ENDOSCOPIC RETROGRADE CHOLANGIOPANCREATOGRAPHY (ERCP) with brushings and stent;   Surgeon: Doran Stabler, MD;  Location: [facility];  Service: Endoscopy;  Laterality: N/A;  . ESOPHAGOGASTRODUODENOSCOPY (EGD) WITH PROPOFOL N/A 05/27/2020   Procedure: ESOPHAGOGASTRODUODENOSCOPY (EGD) WITH PROPOFOL;  Surgeon: Milus Banister, MD;  Location: [facility];  Service: Endoscopy;  Laterality: N/A;  . EUS N/A 03/09/2016   Procedure: ESOPHAGEAL ENDOSCOPIC ULTRASOUND (EUS) RADIAL;  Surgeon: Milus Banister, MD;  [facility]: [facility];  Service: Endoscopy;  Laterality: N/A;  . EUS N/A 05/27/2020   Procedure: UPPER ENDOSCOPIC ULTRASOUND (EUS) RADIAL;  Surgeon: Milus Banister, MD;  [facility]: [facility];  Service: Endoscopy;  Laterality: N/A;  . FEMUR IM NAIL Right 10/25/2020   Procedure: INTRAMEDULLARY (IM) NAIL FEMORAL;  Surgeon: Rod Can, MD;  Location: [facility];  Service: Orthopedics;  Laterality: Right;  . IR THORACENTESIS ASP PLEURAL SPACE W/IMG GUIDE  11/19/2020  . lymph nodes biopsy    . melanoma rt calf  1999  . PORT-A-CATH REMOVAL N/A 04/03/2019   Procedure: PORT REMOVAL;  Surgeon: Stark Klein, MD;  Location: [facility];  Service: General;  Laterality: N/A;  . PORTACATH PLACEMENT Left 03/22/2016   Procedure: INSERTION PORT-A-CATH;  Surgeon: Stark Klein, MD;  Location: [facility];  Service: General;  Laterality: Left;  . SPINE SURGERY    . TONSILLECTOMY     as a child  . ULNAR TUNNEL RELEASE  2013   right arm, per Dr. Christella Noa   . UPPER GASTROINTESTINAL ENDOSCOPY    . WHIPPLE PROCEDURE N/A 09/19/2016   Procedure: DIAGNOSTIC LAPAROSCOPY, LAPAROSCOPIC LIVER BIOPSY, RETROPERITONEAL EXPLORATION, INTRAOPERATIVE ULTRASOUND;  Surgeon: Stark Klein, MD;  Location: [facility];  Service: General;  Laterality: N/A;       Family History  Problem Relation Age of Onset  . Heart disease Father   . Heart disease Brother 44  . Anesthesia problems Mother   . Heart disease Mother   . Dementia Mother   . Diabetes Sister   . Stroke Sister   . Colon cancer Neg Hx   . Rectal cancer Neg Hx    . Stomach cancer Neg Hx     Social History   Tobacco Use  . Smoking status: Never Smoker  . Smokeless tobacco: Never Used  . Tobacco comment: tried a pipe 35 years ago   Vaping Use  . Vaping Use: Never used  Substance Use Topics  . Alcohol use: No    Alcohol/week: 0.0 standard drinks  . Drug use: No    Home Medications Prior to Admission medications   Medication Sig Start Date End Date Taking? Authorizing Provider  acetaminophen (TYLENOL) 325 MG tablet Take 1-2 tablets (325-650 mg total) by mouth every 4 (four) hours as needed for mild pain. 12/08/20   Angiulli, Lavon Paganini, PA-C  ascorbic acid (VITAMIN C) 500 MG tablet Take 1 tablet (500 mg total) by mouth daily. 12/08/20   Angiulli, Lavon Paganini, PA-C  cefTRIAXone (ROCEPHIN) IVPB Inject 2 g into the vein every 12 (twelve) hours. Indication:  liver abscess and possible infected subdural hematoma  First Dose: No Last Day of Therapy:  12/21/20 Labs - Once weekly:  CBC/D and BMP, Labs - Every other week:  ESR and CRP Method of administration: IV Push Method of administration may be changed at the discretion of home infusion pharmacist based upon assessment of the patient and/or caregiver's ability to self-administer the medication ordered. 12/06/20   Blumer, Ivan Anchors, PA-C  citalopram (CELEXA) 20 MG tablet Take 1 tablet (20 mg total) by mouth daily. 12/08/20   Angiulli, Lavon Paganini, PA-C  diclofenac Sodium (VOLTAREN) 1 % GEL Apply 2 g topically 4 (four) times daily. To right knee 12/08/20   Angiulli, Lavon Paganini, PA-C  hydrocerin (EUCERIN) CREA Apply 1 application topically 2 (two) times daily. To legs and back 12/08/20   Angiulli, Lavon Paganini, PA-C  HYDROcodone-acetaminophen (NORCO/VICODIN) 5-325 MG tablet Take 1 tablet by mouth every 3 (three) hours as needed for severe pain. 12/08/20   Angiulli, Lavon Paganini, PA-C  hydrocortisone cream 1 % Apply topically 2 (two) times daily. To back/areas of itching 12/08/20   Angiulli, Lavon Paganini, PA-C  insulin aspart (NOVOLOG  FLEXPEN) 100 UNIT/ML FlexPen Inject 2-13 Units into the skin 3 (three) times daily with meals. Sliding Scale insulin for blood sugars from 101-150 - 2 units. For blood sugars from 151-200 - 3 units. For blood sugars from 201-250 - 5 units. For blood sugars from 301-350 use 7 units. Call MD if blood sugars are over 300. 12/08/20   Yurkovich, Ivan Anchors, PA-C  insulin detemir (LEVEMIR) 100 UNIT/ML FlexPen Inject 15 Units into the skin 2 (two) times daily. 12/08/20   Hull, Ivan Anchors, PA-C  Insulin Pen Needle (PEN NEEDLES) 32G X 6 MM MISC 1 application by Does not apply route as needed. 12/08/20   Frericks, Ivan Anchors, PA-C  levETIRAcetam (KEPPRA) 1000 MG tablet Take 1 tablet (1,000 mg total) by mouth 2 (two) times daily. 12/08/20   Angiulli, Lavon Paganini, PA-C  lidocaine (LIDODERM) 5 % Place 1 patch onto the skin daily. Apply to lower back at 8 pm and remove at 8 am daily 12/08/20   Angiulli, Lavon Paganini, PA-C  lipase/protease/amylase 24000-76000 units CPEP Take 1 capsule (24,000 Units total) by mouth 3 (three) times daily before meals. 12/08/20   Angiulli, Lavon Paganini, PA-C  loratadine (CLARITIN) 10 MG tablet Take 1 tablet (10 mg total) by mouth daily. 12/08/20   Angiulli, Lavon Paganini, PA-C  metroNIDAZOLE (FLAGYL) 500 MG tablet Take 1 tablet (500 mg total) by mouth every 8 (eight) hours for 15 days. Treat through 12/21/20 12/08/20 12/23/20  Cathlyn Parsons, PA-C  midodrine (PROAMATINE) 5 MG tablet Take 1 tablet (5 mg total) by mouth 3 (three) times daily with meals. 12/08/20   Angiulli, Lavon Paganini, PA-C  morphine (MS CONTIN) 30 MG 12 hr tablet Take 1 tablet (30 mg total) by mouth daily. 12/09/20   Angiulli, Lavon Paganini, PA-C  Multiple Vitamin (MULTIVITAMIN WITH MINERALS) TABS tablet Take 1 tablet by mouth daily.    [provider]  omeprazole (PRILOSEC) 20 MG capsule TAKE 1 CAPSULE DAILY 11/17/20   [name], MD  ondansetron (ZOFRAN-ODT) 4 MG disintegrating tablet Take 1 tablet (4 mg total) by mouth every 6 (six) hours as needed for  nausea or vomiting (can alternate with compazine). 12/08/20   Angiulli, Lavon Paganini, PA-C  polycarbophil (FIBERCON) 625 MG tablet Take 1 tablet (625 mg total) by mouth 2 (two) times daily. 12/08/20   Angiulli, Lavon Paganini, PA-C  polyethylene glycol (MIRALAX / GLYCOLAX) 17  g packet Take 17 g by mouth daily as needed for mild constipation. 11/12/20   Debbe Odea, MD  potassium chloride (KLOR-CON) 20 MEQ packet Take 40 mEq by mouth daily. 12/08/20   Angiulli, Lavon Paganini, PA-C  simethicone (MYLICON) 80 MG chewable tablet Chew 1 tablet (80 mg total) by mouth 4 (four) times daily -  with meals and at bedtime. 12/08/20   Angiulli, Lavon Paganini, PA-C  tamsulosin (FLOMAX) 0.4 MG CAPS capsule Take 2 capsules (0.8 mg total) by mouth daily. 12/08/20   Angiulli, Lavon Paganini, PA-C  zinc sulfate 220 (50 Zn) MG capsule Take 1 capsule (220 mg total) by mouth daily. 12/08/20   Angiulli, Lavon Paganini, PA-C    Allergies    Patient has no known allergies.  Review of Systems   Review of Systems  Physical Exam Updated Vital Signs BP (!) 167/88   Pulse 98   Temp 98.3 F (36.8 C) (Oral)   Resp 20   SpO2 99%   Physical Exam Vitals and nursing note reviewed.  Constitutional:      General: He is not in acute distress.    Appearance: Normal appearance. He is ill-appearing.  HENT:     Head: Normocephalic.     Right Ear: External ear normal.     Left Ear: External ear normal.     Nose: Nose normal.     Mouth/Throat:     Mouth: Mucous membranes are dry.  Eyes:     Pupils: Pupils are equal, round, and reactive to light.  Cardiovascular:     Rate and Rhythm: Normal rate and regular rhythm.     Pulses: Normal pulses.  Pulmonary:     Effort: Pulmonary effort is normal.  Abdominal:     General: Abdomen is flat. Bowel sounds are normal. There is no distension.  Genitourinary:    Comments: Foley catheter in place Musculoskeletal:        General: Normal range of motion.     Cervical back: Normal range of motion.  Skin:     General: Skin is warm.     Capillary Refill: Capillary refill takes less than 2 seconds.  Neurological:     General: No focal deficit present.     Mental Status: He is alert.  Psychiatric:        Mood and Affect: Mood normal.     ED Results / Procedures / Treatments   Labs (all labs ordered are listed, but only abnormal results are displayed) Labs Reviewed  CBC WITH DIFFERENTIAL/PLATELET - Abnormal; Notable for the following components:      Result Value   WBC 14.5 (*)    RBC 3.81 (*)    Hemoglobin 10.9 (*)    HCT 33.7 (*)    RDW 17.5 (*)    Platelets 577 (*)    Neutro Abs 12.2 (*)    Monocytes Absolute 1.2 (*)    All other components within normal limits  COMPREHENSIVE METABOLIC PANEL - Abnormal; Notable for the following components:   Glucose, Bld 338 (*)    Total Protein 8.3 (*)    Albumin 3.1 (*)    AST 86 (*)    ALT 50 (*)    Alkaline Phosphatase 1,715 (*)    All other components within normal limits  URINALYSIS, ROUTINE W REFLEX MICROSCOPIC - Abnormal; Notable for the following components:   APPearance HAZY (*)    Glucose, UA >=500 (*)    Ketones, ur 20 (*)    Protein, ur  30 (*)    All other components within normal limits  CBG MONITORING, ED - Abnormal; Notable for the following components:   Glucose-Capillary 314 (*)    All other components within normal limits    EKG None  Radiology DG Chest 2 View  Result Date: 12/09/2020 CLINICAL DATA:  Weakness EXAM: CHEST - 2 VIEW COMPARISON:  11/19/2020 FINDINGS: Cardiac shadow is within normal limits. Patchy airspace opacity is again seen in the right mid lung stable in appearance from the prior exam consistent with some loculated pleural fluid along the posterior margin. Right-sided PICC line is noted in satisfactory position at the cavoatrial junction. The left lung is clear. No bony abnormality is noted. IMPRESSION: Right-sided PICC line in satisfactory position. Vague density overlying the right mid lung which  corresponds to posterior loculated fluid. Electronically Signed   By: Inez Catalina M.D.   On: 12/09/2020 17:37    Procedures Procedures   Medications Ordered in ED Medications  ondansetron (ZOFRAN-ODT) disintegrating tablet 4 mg (has no administration in time range)    ED Course  I have reviewed the triage vital signs and the nursing notes.  Pertinent labs & imaging results that were available during my care of the patient were reviewed by me and considered in my medical decision making (see chart for details).    MDM Rules/Calculators/A&P                         65 year old man with multiple clinical health problems including recent subdural hematoma, liver abscess, recurrent pancreatic cancer, urinary retention with Foley catheter in place who presents today after being discharged from rehab 2 days ago.  He states that his wife has had difficulty following instructions and change in his urinary catheter.  He does have home health in place.  Patient evaluated here in the ED to ensure medical stability.  Repeat CT of the abdomen does not show any recurrence of his liver abscess.  There was some question of some haziness around his right kidney questioning early [place].  Urine is grossly infected and patient is afebrile.  He has a stable right lung empyema with ongoing IV antibiotics.  His liver enzymes are elevated today with alk phos at 1715 and mild transaminitis.  He appears to Depleted and received some IV fluids. Patient with multiple abnormalities but appears stable from prior. He appears stable for discharge back to home.  Final Clinical Impression(s) / ED Diagnoses Final diagnoses:  Weakness  Empyema lung ([place])  Malignant neoplasm of pancreas, unspecified location of malignancy [facility])    Rx / DC Orders ED Discharge Orders    None       Pattricia Boss, MD 12/10/20 1224

## 2020-12-10 NOTE — Telephone Encounter (Signed)
Pt is now in ER- wife "just couldn't handle things at home"- and so brought to ER- if he doesn't go to nursing home, then will deal with these issues for home- thanks, ML

## 2020-12-13 ENCOUNTER — Telehealth: Payer: Self-pay

## 2020-12-13 ENCOUNTER — Telehealth: Payer: Self-pay | Admitting: Hematology

## 2020-12-13 ENCOUNTER — Emergency Department (HOSPITAL_COMMUNITY)
Admission: EM | Admit: 2020-12-13 | Discharge: 2020-12-13 | Disposition: A | Discharge status: Home / Self Care | Payer: Medicare Other | Source: Home / Self Care | Attending: Emergency Medicine | Admitting: Emergency Medicine

## 2020-12-13 ENCOUNTER — Emergency Department (HOSPITAL_COMMUNITY): Payer: Medicare Other

## 2020-12-13 DIAGNOSIS — M1711 Unilateral primary osteoarthritis, right knee: Secondary | ICD-10-CM

## 2020-12-13 DIAGNOSIS — E1110 Type 2 diabetes mellitus with ketoacidosis without coma: Secondary | ICD-10-CM | POA: Insufficient documentation

## 2020-12-13 DIAGNOSIS — Z1621 Resistance to vancomycin: Secondary | ICD-10-CM | POA: Diagnosis not present

## 2020-12-13 DIAGNOSIS — M179 Osteoarthritis of knee, unspecified: Secondary | ICD-10-CM | POA: Insufficient documentation

## 2020-12-13 DIAGNOSIS — E11649 Type 2 diabetes mellitus with hypoglycemia without coma: Secondary | ICD-10-CM | POA: Insufficient documentation

## 2020-12-13 DIAGNOSIS — I110 Hypertensive heart disease with heart failure: Secondary | ICD-10-CM | POA: Insufficient documentation

## 2020-12-13 DIAGNOSIS — K750 Abscess of liver: Secondary | ICD-10-CM | POA: Diagnosis not present

## 2020-12-13 DIAGNOSIS — E876 Hypokalemia: Secondary | ICD-10-CM

## 2020-12-13 DIAGNOSIS — E1169 Type 2 diabetes mellitus with other specified complication: Secondary | ICD-10-CM | POA: Insufficient documentation

## 2020-12-13 DIAGNOSIS — E162 Hypoglycemia, unspecified: Secondary | ICD-10-CM | POA: Diagnosis not present

## 2020-12-13 DIAGNOSIS — E782 Mixed hyperlipidemia: Secondary | ICD-10-CM | POA: Insufficient documentation

## 2020-12-13 DIAGNOSIS — I5032 Chronic diastolic (congestive) heart failure: Secondary | ICD-10-CM | POA: Insufficient documentation

## 2020-12-13 DIAGNOSIS — D649 Anemia, unspecified: Secondary | ICD-10-CM | POA: Insufficient documentation

## 2020-12-13 DIAGNOSIS — R42 Dizziness and giddiness: Secondary | ICD-10-CM | POA: Diagnosis not present

## 2020-12-13 DIAGNOSIS — S065X0A Traumatic subdural hemorrhage without loss of consciousness, initial encounter: Secondary | ICD-10-CM | POA: Diagnosis not present

## 2020-12-13 DIAGNOSIS — C250 Malignant neoplasm of head of pancreas: Secondary | ICD-10-CM | POA: Diagnosis not present

## 2020-12-13 DIAGNOSIS — C259 Malignant neoplasm of pancreas, unspecified: Secondary | ICD-10-CM | POA: Diagnosis not present

## 2020-12-13 DIAGNOSIS — Z20822 Contact with and (suspected) exposure to covid-19: Secondary | ICD-10-CM | POA: Diagnosis not present

## 2020-12-13 DIAGNOSIS — R569 Unspecified convulsions: Secondary | ICD-10-CM | POA: Diagnosis not present

## 2020-12-13 DIAGNOSIS — E161 Other hypoglycemia: Secondary | ICD-10-CM | POA: Diagnosis not present

## 2020-12-13 DIAGNOSIS — G319 Degenerative disease of nervous system, unspecified: Secondary | ICD-10-CM | POA: Diagnosis not present

## 2020-12-13 DIAGNOSIS — J869 Pyothorax without fistula: Secondary | ICD-10-CM | POA: Diagnosis not present

## 2020-12-13 DIAGNOSIS — Z8507 Personal history of malignant neoplasm of pancreas: Secondary | ICD-10-CM | POA: Insufficient documentation

## 2020-12-13 DIAGNOSIS — R0602 Shortness of breath: Secondary | ICD-10-CM | POA: Diagnosis not present

## 2020-12-13 DIAGNOSIS — B954 Other streptococcus as the cause of diseases classified elsewhere: Secondary | ICD-10-CM | POA: Diagnosis not present

## 2020-12-13 DIAGNOSIS — R55 Syncope and collapse: Secondary | ICD-10-CM | POA: Diagnosis not present

## 2020-12-13 DIAGNOSIS — I10 Essential (primary) hypertension: Secondary | ICD-10-CM | POA: Diagnosis not present

## 2020-12-13 DIAGNOSIS — Z794 Long term (current) use of insulin: Secondary | ICD-10-CM | POA: Insufficient documentation

## 2020-12-13 DIAGNOSIS — B9620 Unspecified Escherichia coli [E. coli] as the cause of diseases classified elsewhere: Secondary | ICD-10-CM | POA: Diagnosis not present

## 2020-12-13 DIAGNOSIS — Z452 Encounter for adjustment and management of vascular access device: Secondary | ICD-10-CM | POA: Diagnosis not present

## 2020-12-13 DIAGNOSIS — W19XXXA Unspecified fall, initial encounter: Secondary | ICD-10-CM

## 2020-12-13 DIAGNOSIS — B961 Klebsiella pneumoniae [K. pneumoniae] as the cause of diseases classified elsewhere: Secondary | ICD-10-CM | POA: Diagnosis not present

## 2020-12-13 DIAGNOSIS — R531 Weakness: Secondary | ICD-10-CM

## 2020-12-13 LAB — CBC WITH DIFFERENTIAL/PLATELET
Abs Immature Granulocytes: 0.06 10*3/uL (ref 0.00–0.07)
Basophils Absolute: 0.1 10*3/uL (ref 0.0–0.1)
Basophils Relative: 0 %
Eosinophils Absolute: 0.3 10*3/uL (ref 0.0–0.5)
Eosinophils Relative: 2 %
HCT: 30.4 % — ABNORMAL LOW (ref 39.0–52.0)
Hemoglobin: 9.7 g/dL — ABNORMAL LOW (ref 13.0–17.0)
Immature Granulocytes: 0 %
Lymphocytes Relative: 8 %
Lymphs Abs: 1.1 10*3/uL (ref 0.7–4.0)
MCH: 28.7 pg (ref 26.0–34.0)
MCHC: 31.9 g/dL (ref 30.0–36.0)
MCV: 89.9 fL (ref 80.0–100.0)
Monocytes Absolute: 0.9 10*3/uL (ref 0.1–1.0)
Monocytes Relative: 6 %
Neutro Abs: 11.1 10*3/uL — ABNORMAL HIGH (ref 1.7–7.7)
Neutrophils Relative %: 84 %
Platelets: 361 10*3/uL (ref 150–400)
RBC: 3.38 MIL/uL — ABNORMAL LOW (ref 4.22–5.81)
RDW: 18.4 % — ABNORMAL HIGH (ref 11.5–15.5)
WBC: 13.5 10*3/uL — ABNORMAL HIGH (ref 4.0–10.5)
nRBC: 0 % (ref 0.0–0.2)

## 2020-12-13 LAB — COMPREHENSIVE METABOLIC PANEL
ALT: 34 U/L (ref 0–44)
AST: 46 U/L — ABNORMAL HIGH (ref 15–41)
Albumin: 2.8 g/dL — ABNORMAL LOW (ref 3.5–5.0)
Alkaline Phosphatase: 1538 U/L — ABNORMAL HIGH (ref 38–126)
Anion gap: 9 (ref 5–15)
BUN: 14 mg/dL (ref 8–23)
CO2: 28 mmol/L (ref 22–32)
Calcium: 8.3 mg/dL — ABNORMAL LOW (ref 8.9–10.3)
Chloride: 98 mmol/L (ref 98–111)
Creatinine, Ser: 0.38 mg/dL — ABNORMAL LOW (ref 0.61–1.24)
GFR, Estimated: >60 mL/min (ref >60)
Glucose, Bld: 183 mg/dL — ABNORMAL HIGH (ref 70–99)
Potassium: 2.8 mmol/L — ABNORMAL LOW (ref 3.5–5.1)
Sodium: 135 mmol/L (ref 135–145)
Total Bilirubin: 0.4 mg/dL (ref 0.3–1.2)
Total Protein: 6.8 g/dL (ref 6.5–8.1)

## 2020-12-13 LAB — CBG MONITORING, ED
Glucose-Capillary: 61 mg/dL — ABNORMAL LOW (ref 70–99)
Glucose-Capillary: 92 mg/dL (ref 70–99)

## 2020-12-13 NOTE — ED Triage Notes (Signed)
Pt BIB EMS from home c/o gen weakness and hypoglycemia. Hx of multiple falls (2 falls yesterday, 1 fall today). On blood thinners. Initial CBG 47, 15G oral glucose; most recent CBG 67.

## 2020-12-13 NOTE — Telephone Encounter (Signed)
Today Mr. Colton Bush. [name] has been transported by EMS to the ED.   Per Home Health Nurse his blood pressure reading today were: 112/64 & 108/60. Blood glucose reading was 47.

## 2020-12-13 NOTE — ED Provider Notes (Signed)
[facility] DEPT Provider Note   CSN: [id number] Arrival date & time: 12/13/20  1645     History Chief Complaint  Patient presents with  . Weakness  . Fall  . Hypoglycemia    Colton Bush is a 65 y.o. male.  HPI Patient presents after fall and generalized weakness.  Has had 3 falls the last couple days.  He states he was unable to get up today.  States he was doing well yesterday and walking all around.  Recent admission to the hospital.  Has had recurrent pancreatic cancer.  Had liver abscess.  Had lung abscess.  Had been in rehab up until May 11.  Also had had subdural hematoma.  Found to be hypoglycemic at home with sugars in 47.  States that he was too weak to get up.  States that home health came was able to help get him up.  States he is worried that he is going to go home and die.  States he was doing very well yesterday.  No fevers or chills.  No cough.  Reportedly fell down and did hit the back of her head.  Had been on anticoagulation but appears she is not still on it.    Past Medical History:  Diagnosis Date  . Arthritis    left hand  . Bronchitis 1977  . Cancer ([place]) 03/09/2016   pancreatic cancer, sees Dr. Cristino Martes at [facility]   . Depression    takes Cymbalta daily  . Diabetes mellitus type II    sees Dr. Chalmers Cater   . GERD (gastroesophageal reflux disease)    takes Omeprazole daily  . H/O hiatal hernia   . Hyperlipidemia    takes Zocor daily  . Hypertension    takes Amlodipine daily  . Hypoglycemia 06/18/2017  . Neck pain    C4-7 stenosis and herniated disc  . Neuromuscular disorder ([place])    hiatal hernia  . Scoliosis    slight  . Spinal cord injury, C5-C7 ([place])    c4-c7  . Stiffness of hand joint    d/t cervical issues    Patient Active Problem List   Diagnosis Date Noted  . Anemia of chronic illness 12/09/2020  . Hypokalemia 12/09/2020  . Nausea & vomiting   . Pain   . Bladder outlet obstruction 11/12/2020  .  Toxic encephalopathy 11/12/2020  . Atrophic pancreas 11/12/2020  . Subdural hematoma ([place])   . Pneumonia of both lower lobes due to infectious organism 11/06/2020  . Liver abscess 11/06/2020  . Sepsis ([place]) 11/05/2020  . Uncontrolled type 2 diabetes mellitus with ketoacidosis without coma, with long-term current use of insulin ([place]) 11/05/2020  . Increased anion gap metabolic acidosis 62/70/3500  . Chronic diastolic CHF (congestive heart failure) ([place]) 11/05/2020  . Closed right hip fracture, initial encounter ([place]) 10/18/2020  . Fall from ground level 10/18/2020  . Hypoglycemia 06/19/2017  . Hypothermia 06/19/2017  . Goals of care, counseling/discussion 09/29/2016  . Port catheter in place 04/11/2016  . Hypercalcemia 03/29/2016  . Adenocarcinoma of head of pancreas ([place]) 03/17/2016  . Biliary obstruction   . Obstructive jaundice due to malignant neoplasm ([place]) 02/27/2016  . DKA (diabetic ketoacidosis) ([place]) 02/27/2016  . Mixed diabetic hyperlipidemia associated with type 2 diabetes mellitus ([place]) 05/05/2014  . Cervical spondylosis with myelopathy 08/18/2011  . CERUMEN IMPACTION 12/14/2008  . Diabetes ([place]) 09/16/2007  . Hyperlipidemia, mixed 09/16/2007  . Essential hypertension 09/16/2007  . GERD without esophagitis 09/16/2007  .  ESOPHAGEAL STRICTURE 04/04/2007  . HIATAL HERNIA 04/04/2007    Past Surgical History:  Procedure Laterality Date  . ANTERIOR CERVICAL DECOMP/DISCECTOMY FUSION  08/18/2011   Procedure: ANTERIOR CERVICAL DECOMPRESSION/DISCECTOMY FUSION 3 LEVELS;  Surgeon: Winfield Cunas, MD;  Location: [facility];  Service: Neurosurgery;  Laterality: N/A;  Anterior Cervical Four-Five/Five-Six/Six-Seven Decompression with Fusion, Plating, and Bonegraft  . BURR HOLE Left 11/09/2020   Procedure: LEFT BURR HOLES FOR EVACUATION OF Subdural Hematoma;  Surgeon: Judith Part, MD;  Location: [facility];  Service: Neurosurgery;  Laterality: Left;  . CARPAL TUNNEL RELEASE  2013    bilateral, per Dr. Christella Noa   . COLONOSCOPY  10-30-14   per Dr. Olevia Perches, clear, repeat in 10 yrs   . egd with esophageal dilation  9-08   per Dr. Olevia Perches  . ERCP N/A 03/01/2016   Procedure: ENDOSCOPIC RETROGRADE CHOLANGIOPANCREATOGRAPHY (ERCP) with brushings and stent;  Surgeon: Doran Stabler, MD;  Location: [facility];  Service: Endoscopy;  Laterality: N/A;  . ESOPHAGOGASTRODUODENOSCOPY (EGD) WITH PROPOFOL N/A 05/27/2020   Procedure: ESOPHAGOGASTRODUODENOSCOPY (EGD) WITH PROPOFOL;  Surgeon: Milus Banister, MD;  Location: [facility];  Service: Endoscopy;  Laterality: N/A;  . EUS N/A 03/09/2016   Procedure: ESOPHAGEAL ENDOSCOPIC ULTRASOUND (EUS) RADIAL;  Surgeon: Milus Banister, MD;  Location: [facility];  Service: Endoscopy;  Laterality: N/A;  . EUS N/A 05/27/2020   Procedure: UPPER ENDOSCOPIC ULTRASOUND (EUS) RADIAL;  Surgeon: Milus Banister, MD;  Location: [facility];  Service: Endoscopy;  Laterality: N/A;  . FEMUR IM NAIL Right 10/25/2020   Procedure: INTRAMEDULLARY (IM) NAIL FEMORAL;  Surgeon: Rod Can, MD;  Location: [facility];  Service: Orthopedics;  Laterality: Right;  . IR THORACENTESIS ASP PLEURAL SPACE W/IMG GUIDE  11/19/2020  . lymph nodes biopsy    . melanoma rt calf  1999  . PORT-A-CATH REMOVAL N/A 04/03/2019   Procedure: PORT REMOVAL;  Surgeon: Stark Klein, MD;  Location: [facility];  Service: General;  Laterality: N/A;  . PORTACATH PLACEMENT Left 03/22/2016   Procedure: INSERTION PORT-A-CATH;  Surgeon: Stark Klein, MD;  Location: [facility];  Service: General;  Laterality: Left;  . SPINE SURGERY    . TONSILLECTOMY     as a child  . ULNAR TUNNEL RELEASE  2013   right arm, per Dr. Christella Noa   . UPPER GASTROINTESTINAL ENDOSCOPY    . WHIPPLE PROCEDURE N/A 09/19/2016   Procedure: DIAGNOSTIC LAPAROSCOPY, LAPAROSCOPIC LIVER BIOPSY, RETROPERITONEAL EXPLORATION, INTRAOPERATIVE ULTRASOUND;  Surgeon: Stark Klein, MD;  Location: [facility];  Service: General;  Laterality: N/A;        Family History  Problem Relation Age of Onset  . Heart disease Father   . Heart disease Brother 64  . Anesthesia problems Mother   . Heart disease Mother   . Dementia Mother   . Diabetes Sister   . Stroke Sister   . Colon cancer Neg Hx   . Rectal cancer Neg Hx   . Stomach cancer Neg Hx     Social History   Tobacco Use  . Smoking status: Never Smoker  . Smokeless tobacco: Never Used  . Tobacco comment: tried a pipe 35 years ago   Vaping Use  . Vaping Use: Never used  Substance Use Topics  . Alcohol use: No    Alcohol/week: 0.0 standard drinks  . Drug use: No    Home Medications Prior to Admission medications   Medication Sig Start Date End Date Taking? Authorizing Provider  acetaminophen (TYLENOL) 325 MG  tablet Take 1-2 tablets (325-650 mg total) by mouth every 4 (four) hours as needed for mild pain. 12/08/20   Angiulli, Lavon Paganini, PA-C  ascorbic acid (VITAMIN C) 500 MG tablet Take 1 tablet (500 mg total) by mouth daily. 12/08/20   Angiulli, Lavon Paganini, PA-C  cefTRIAXone (ROCEPHIN) IVPB Inject 2 g into the vein every 12 (twelve) hours. Indication:  liver abscess and possible infected subdural hematoma First Dose: No Last Day of Therapy:  12/21/20 Labs - Once weekly:  CBC/D and BMP, Labs - Every other week:  ESR and CRP Method of administration: IV Push Method of administration may be changed at the discretion of home infusion pharmacist based upon assessment of the patient and/or caregiver's ability to self-administer the medication ordered. 12/06/20   Baldassari, Ivan Anchors, PA-C  citalopram (CELEXA) 20 MG tablet Take 1 tablet (20 mg total) by mouth daily. 12/08/20   Angiulli, Lavon Paganini, PA-C  diclofenac Sodium (VOLTAREN) 1 % GEL Apply 2 g topically 4 (four) times daily. To right knee 12/08/20   Angiulli, Lavon Paganini, PA-C  hydrocerin (EUCERIN) CREA Apply 1 application topically 2 (two) times daily. To legs and back 12/08/20   Angiulli, Lavon Paganini, PA-C  HYDROcodone-acetaminophen  (NORCO/VICODIN) 5-325 MG tablet Take 1 tablet by mouth every 3 (three) hours as needed for severe pain. 12/08/20   Angiulli, Lavon Paganini, PA-C  hydrocortisone cream 1 % Apply topically 2 (two) times daily. To back/areas of itching 12/08/20   Angiulli, Lavon Paganini, PA-C  insulin aspart (NOVOLOG FLEXPEN) 100 UNIT/ML FlexPen Inject 2-13 Units into the skin 3 (three) times daily with meals. Sliding Scale insulin for blood sugars from 101-150 - 2 units. For blood sugars from 151-200 - 3 units. For blood sugars from 201-250 - 5 units. For blood sugars from 301-350 use 7 units. Call MD if blood sugars are over 300. 12/08/20   Mathey, Ivan Anchors, PA-C  insulin detemir (LEVEMIR) 100 UNIT/ML FlexPen Inject 15 Units into the skin 2 (two) times daily. 12/08/20   Hirth, Ivan Anchors, PA-C  Insulin Pen Needle (PEN NEEDLES) 32G X 6 MM MISC 1 application by Does not apply route as needed. 12/08/20   Hebdon, Ivan Anchors, PA-C  levETIRAcetam (KEPPRA) 1000 MG tablet Take 1 tablet (1,000 mg total) by mouth 2 (two) times daily. 12/08/20   Angiulli, Lavon Paganini, PA-C  lidocaine (LIDODERM) 5 % Place 1 patch onto the skin daily. Apply to lower back at 8 pm and remove at 8 am daily 12/08/20   Angiulli, Lavon Paganini, PA-C  lipase/protease/amylase 24000-76000 units CPEP Take 1 capsule (24,000 Units total) by mouth 3 (three) times daily before meals. 12/08/20   Angiulli, Lavon Paganini, PA-C  loratadine (CLARITIN) 10 MG tablet Take 1 tablet (10 mg total) by mouth daily. 12/08/20   Angiulli, Lavon Paganini, PA-C  metroNIDAZOLE (FLAGYL) 500 MG tablet Take 1 tablet (500 mg total) by mouth every 8 (eight) hours for 15 days. Treat through 12/21/20 12/08/20 12/23/20  Cathlyn Parsons, PA-C  midodrine (PROAMATINE) 5 MG tablet Take 1 tablet (5 mg total) by mouth 3 (three) times daily with meals. 12/08/20   Angiulli, Lavon Paganini, PA-C  morphine (MS CONTIN) 30 MG 12 hr tablet Take 1 tablet (30 mg total) by mouth daily. 12/09/20   Angiulli, Lavon Paganini, PA-C  Multiple Vitamin (MULTIVITAMIN WITH  MINERALS) TABS tablet Take 1 tablet by mouth daily.    [provider]  omeprazole (PRILOSEC) 20 MG capsule TAKE 1 CAPSULE DAILY 11/17/20  [name], MD  ondansetron (ZOFRAN-ODT) 4 MG disintegrating tablet Take 1 tablet (4 mg total) by mouth every 6 (six) hours as needed for nausea or vomiting (can alternate with compazine). 12/08/20   Angiulli, Lavon Paganini, PA-C  polycarbophil (FIBERCON) 625 MG tablet Take 1 tablet (625 mg total) by mouth 2 (two) times daily. 12/08/20   Angiulli, Lavon Paganini, PA-C  polyethylene glycol (MIRALAX / GLYCOLAX) 17 g packet Take 17 g by mouth daily as needed for mild constipation. 11/12/20   Debbe Odea, MD  potassium chloride (KLOR-CON) 20 MEQ packet Take 40 mEq by mouth daily. 12/08/20   Angiulli, Lavon Paganini, PA-C  simethicone (MYLICON) 80 MG chewable tablet Chew 1 tablet (80 mg total) by mouth 4 (four) times daily -  with meals and at bedtime. 12/08/20   Angiulli, Lavon Paganini, PA-C  tamsulosin (FLOMAX) 0.4 MG CAPS capsule Take 2 capsules (0.8 mg total) by mouth daily. 12/08/20   Angiulli, Lavon Paganini, PA-C  zinc sulfate 220 (50 Zn) MG capsule Take 1 capsule (220 mg total) by mouth daily. 12/08/20   Angiulli, Lavon Paganini, PA-C    Allergies    Patient has no known allergies.  Review of Systems   Review of Systems  Constitutional: Positive for fatigue. Negative for appetite change and fever.  HENT: Negative for congestion.   Respiratory: Negative for shortness of breath.   Cardiovascular: Negative for chest pain.  Gastrointestinal: Negative for abdominal pain.  Endocrine: Negative for polyuria.  Genitourinary: Negative for flank pain.  Skin: Negative for rash.  Neurological: Positive for weakness.  Psychiatric/Behavioral: Negative for confusion.    Physical Exam Updated Vital Signs BP 130/74 (BP Location: Left Arm)   Pulse 90   Temp 98.6 F (37 C) (Oral)   Resp 15   SpO2 95%   Physical Exam Vitals reviewed.  Constitutional:      Appearance: Normal  appearance.     Comments: Appears chronically ill  HENT:     Head: Normocephalic and atraumatic.  Eyes:     Pupils: Pupils are equal, round, and reactive to light.  Pulmonary:     Breath sounds: No wheezing.  Abdominal:     Tenderness: There is no abdominal tenderness.  Musculoskeletal:     Cervical back: Neck supple.     Comments: PICC line right upper arm  Skin:    General: Skin is warm.     Capillary Refill: Capillary refill takes less than 2 seconds.     Coloration: Skin is not jaundiced.  Neurological:     Mental Status: He is alert and oriented to person, place, and time.     ED Results / Procedures / Treatments   Labs (all labs ordered are listed, but only abnormal results are displayed) Labs Reviewed  CBC WITH DIFFERENTIAL/PLATELET - Abnormal; Notable for the following components:      Result Value   WBC 13.5 (*)    RBC 3.38 (*)    Hemoglobin 9.7 (*)    HCT 30.4 (*)    RDW 18.4 (*)    Neutro Abs 11.1 (*)    All other components within normal limits  COMPREHENSIVE METABOLIC PANEL - Abnormal; Notable for the following components:   Potassium 2.8 (*)    Glucose, Bld 183 (*)    Creatinine, Ser 0.38 (*)    Calcium 8.3 (*)    Albumin 2.8 (*)    AST 46 (*)    Alkaline Phosphatase 1,538 (*)    All other components  within normal limits  CBG MONITORING, ED - Abnormal; Notable for the following components:   Glucose-Capillary 61 (*)    All other components within normal limits  CBG MONITORING, ED    EKG None  Radiology DG Chest 2 View  Result Date: 12/13/2020 CLINICAL DATA:  Weakness EXAM: CHEST - 2 VIEW COMPARISON:  October 09, 2020 FINDINGS: Right upper extremity PICC with tip overlying the superior cavoatrial junction. The heart size and mediastinal contours are within normal limits. Increased conspicuity of the they density overlying the right midlung. Left lung is clear. The visualized skeletal structures are unremarkable. IMPRESSION: Increased conspicuity of  they density overlying the right midlung, corresponding with the posterior loculated fluid collection. Electronically Signed   By: Dahlia Bailiff MD   On: 12/13/2020 21:07   CT Head Wo Contrast  Result Date: 12/13/2020 CLINICAL DATA:  Head trauma fall EXAM: CT HEAD WITHOUT CONTRAST TECHNIQUE: Contiguous axial images were obtained from the base of the skull through the vertex without intravenous contrast. COMPARISON:  CT brain 12/10/2020 FINDINGS: Brain: No acute territorial infarction, mass or new hemorrhage is visualized. Left hemispheric subdural hematoma without significant change since 12/10/2020, measures 3 mm maximum. No midline shift. Mild atrophy and chronic small vessel ischemic changes of the white matter. Vascular: No hyperdense vessels.  No unexpected calcification Skull: Left frontal burr hole.  No fracture Sinuses/Orbits: No acute finding. Other: None IMPRESSION: 1. No significant change in size or appearance of thin left convexity subdural hematoma without significant mass effect or midline shift. No acute interval change since 12/10/2020. 2. Atrophy and chronic small vessel ischemic change of the white matter Electronically Signed   By: Donavan Foil M.D.   On: 12/13/2020 22:05   DG Knee Complete 4 Views Right  Result Date: 12/13/2020 CLINICAL DATA:  Fall with right knee swelling EXAM: RIGHT KNEE - COMPLETE 4+ VIEW COMPARISON:  October 19, 2020 FINDINGS: Partially visualized femoral intramedullary rod and screw fixation without evidence of hardware loosening or perihardware fracture within the visualized portions. No evidence of acute fracture or dislocation. Small joint effusion. Severe tricompartment degenerative change with chondrocalcinosis similar prior. Deformity of the proximal right fibula consistent with remote healed fracture. IMPRESSION: 1. No acute fracture or dislocation. 2. Small joint effusion. 3. Severe tricompartment degenerative change with chondrocalcinosis. Electronically  Signed   By: Dahlia Bailiff MD   On: 12/13/2020 21:01    Procedures Procedures   Medications Ordered in ED Medications - No data to display  ED Course  I have reviewed the triage vital signs and the nursing notes.  Pertinent labs & imaging results that were available during my care of the patient were reviewed by me and considered in my medical decision making (see chart for details).    MDM Rules/Calculators/A&P                          Patient presented with weakness.  Had some hypoglycemia at home.  Improved now.  Has eaten.  Also has some mild changes with a mild hypokalemia.  Mild anemia.  Feeling somewhat better now.  Also has right knee pain.  Acute on chronic.  Head CT done due to fall.  Initially told to be on anticoagulation but does not appear that he is still on it.  Chest x-ray showed known infiltrate in lungs.  May be more intense.  However do not see reason to admit.  Has been feeling weak but yesterday was doing  better.  Has overall not been doing all that great at home but does have home health.  There is already social work working on further help with placement.  Will discharge home although I think he has high risk of coming back but do not have criteria to bring in the hospital at this time. Final Clinical Impression(s) / ED Diagnoses Final diagnoses:  Weakness  Fall, initial encounter  Hypokalemia  Arthritis of knee, right    Rx / DC Orders ED Discharge Orders    None       Davonna Belling, MD 12/13/20 2352

## 2020-12-13 NOTE — Progress Notes (Incomplete)
[place]   [phone] Fax:(336) [phone]   Clinic Follow up Note   Patient Care Team: [name], MD as PCP - General (Family Medicine)  Date of Service:  12/13/2020  CHIEF COMPLAINT: Follow up pancreatic cancer  SUMMARY OF ONCOLOGIC HISTORY: Oncology History Overview Note  Cancer Staging Adenocarcinoma of head of pancreas [facility]) Staging form: Pancreas, AJCC 7th Edition - Clinical stage from 03/09/2016: Stage IIB (T2, N1, M0) - Signed by Truitt Merle, MD on 03/17/2016 - Pathologic stage from 11/17/2016: Stage IB (yT2, N0, cM0) - Signed by Truitt Merle, MD on 12/10/2016     Adenocarcinoma of head of pancreas ([place])  02/27/2016 Imaging   CT chest, abdomen and pelvis with contrast showed ill-defined heterogeneity of pancreatic head, highly suspicious for malignancy, ) quit about and biliary ductal dilatation, mildly prominent lymph nodes in the upper abdomen, largest 2 cm in the portocaval . No other metastasis.    03/09/2016 Initial Diagnosis   Adenocarcinoma of head of pancreas ([place])   03/09/2016 Procedure   Upper EUS showed a 3.5 cm irregular mass in the pancreatic head, causing pancreatic and biliary duct obstruction. The mass involves the portal vein 422 mm, strongly suggesting invasion. There is suspicious nearby adenopathy   03/09/2016 Initial Biopsy   Fine-needle aspiration of the pancreatic mass from EUS showed malignant cells consistent with adenocarcinoma.    03/28/2016 - 05/24/2016 Chemotherapy   neoadjuvant chemo with FOLFIRINOX every 2 weeks, for 5 cycles    06/28/2016 - 08/10/2016 Radiation Therapy   Neoadjuvant radiation by Dr. [name] Site/dose:   Pancreas treated to 45 Gy in 25 fractions. The Pancreas was then boosted to 54 Gy in 5 fractions.   06/28/2016 - 08/10/2016 Chemotherapy   Xeloda 2000 mg in the morning and 1500 mg in the evening, on the day of radiation.   09/04/2016 Imaging   CT Chest Abdomen Pelvis IMPRESSION: 1. Poorly defined  pancreatic head mass is grossly stable, with associated pancreatic ductal dilatation and portacaval adenopathy. No associated vascular encasement. 2. Common bile duct stent in place with stable biliary ductal dilatation. Left hepatic lobe atrophy. 3. Hepatomegaly.  Spleen is at the upper limits normal in size. 4. Aortic atherosclerosis (ICD10-170.0). Coronary artery calcification.   09/19/2016 Surgery   Diagnostic laparoscopy and possible whipple procedure by Dr. Barry Dienes. Whipple procedure aborted due to the pancreatic cancer involving SMV, SMA and portal veins.   09/19/2016 Pathology Results   Two liver lesions were noted during diagnostic laparoscopy and biopsy revealed benign hepatic parenchyma with assoicated fibrosis with no evidence of malignancy.   10/06/2016 - 10/20/2016 Chemotherapy   Gemcitabine and Abraxane weekly, on day 1, 8, and 15 every 28 days, starting on 10/06/2016, held after1 cycle due to pending surgery at [facility]   10/31/2016 Imaging   CT CAP w contrast at [facility] 10/31/16 Impression: 1.Interval decrease in the size of the ill-defined pancreatic head mass. There is short segment abutment of the portal vein/superior mesenteric vein but no evidence of venous distortion. 2.No evidence of metastatic disease in the chest, abdomen and pelvis.   11/17/2016 -  Hospital Admission   Patient presents to hospital for nausea and vomitting   11/17/2016 Surgery   pancreatecomy, promximal subtotal with total duodenectomy, partial  GASTRECTOMY, CHOLEDOCHOENTEROSTOMY AND GASTROJEJUNOSTOMY (WHIPPLE-TYPE PROCEDURE); WITH PANCREATOJEJUNOSTOMY   01/19/2017 - 03/02/2017 Chemotherapy   Gemcitabine monotherapy: 1072m/m2, d1,8 Q21d x2 cycles planned    05/18/2017 [facility]   [facility] Impression: 1.Status post Whipple  with soft tissue density just posterior to the SMA, which may be postsurgical, lymph node, or less likely recurrent disease. Recommend attention on follow-up 2.No  evidence of metastatic disease in the chest, abdomen, and pelvis.     09/20/2017 Imaging   CT CAP at [facility] 09/20/17 Impression: 1. Unchanged subcentimeter soft tissue nodule in the Whipple surgical bed, likely a small lymph node. 2. No evidence of metastatic disease in the chest, abdomen, or pelvis.   01/17/2018 Imaging   01/17/2018 CT CAP Impression: No evidence of recurrent or metastatic disease.   06/13/2018 Imaging   CT CAP W Contrast at [place] on 06/13/18 Impression:  1. Status post Whipple procedure with stable findings at the surgical bed. No convincing evidence of recurrent or metastatic disease within the chest, abdomen, or pelvis.  Electronically Reviewed by:  Gaye Pollack, MD, [facility] Electronically Reviewed on:  06/13/2018 12:15 PM  I have reviewed the images and concur with the above findings.   11/08/2018 Imaging   CT CAP W Contrast  IMPRESSION: 1. No findings of active malignancy. Prior Whipple procedure. Biliary stent in place. 2. Other imaging findings of potential clinical significance: Thoracic scoliosis. Aortic Atherosclerosis (ICD10-I70.0). Multilevel lumbar impingement.     02/06/2019 Imaging   CT CAP W Contrast IMPRESSION: 1. Status post Whipple procedure with no definitive findings to suggest metastatic disease in the chest, abdomen or pelvis. 2. Stable tiny pulmonary nodules in the right upper lobe compared to prior examinations, strongly favored to be benign. 3. Aortic atherosclerosis, in addition to left main and 2 vessel coronary artery disease. Please note that although the presence of coronary artery calcium documents the presence of coronary artery disease, the severity of this disease and any potential stenosis cannot be assessed on this non-gated CT examination. Assessment for potential risk factor modification, dietary therapy or pharmacologic therapy may be warranted, if clinically indicated. 4. Additional incidental findings, as  above.   04/2019 Procedure   He had PAC removed in 04/2019   08/14/2019 Imaging   CT CAP at [facility] 08/14/19 IMPRESSION: 1.  History of pancreatic adenocarcinoma status post Whipple with stable findings at the surgical bed. 2.  No evidence of metastatic disease in the chest, abdomen, or pelvis. 3.  Arterial phase hyperenhancing 9 mm lesion in subcapsular portion of hepatic segment 8, favor a flash filling hemangioma. Attention on follow-up.   09/04/2019 PET scan   IMPRESSION: 1. Post Whipple procedure without evidence of local pancreatic carcinoma recurrence. 2. No evidence metastatic disease in the liver or periportal lymph nodes. 3. No distant metastatic disease.   11/11/2019 Imaging   CT CAP Impression:   1. Stable appearance of arterially enhancing 9 mm lesion within segment 8  of the liver. Continued attention on follow-up.  2. No evidence of metastatic disease within the chest, abdomen, or pelvis.  3. Stable appearance of soft tissue density around the common hepatic  artery, for example series 8, image 44. This may represent post-surgical  changes. Continued attention on follow-up.    02/10/2020 Imaging   CT CAP  Impression:  Similar appearance of ill-defined soft tissue surrounding the common  hepatic artery, favored to be postsurgical. Attention on follow-up.   No definite evidence of metastatic disease.   Previously identified arterially enhancing 9 mm lesion in segment 8 of the  liver not well evaluated on today's study due to lack of arterial phase  imaging, possibly flash-filling hemangioma. Recommend attention on  follow-up.    04/14/2020 Tumor Marker  CA 19-9 at 1124   04/26/2020 Imaging   PET  IMPRESSION: Status post Whipple procedure.   Soft tissue fullness in the porta hepatis with associated mild hypermetabolism, worrisome for recurrence.   No evidence of distant metastases.   05/27/2020 Procedure   EUS by Dr Ardis Hughs  IMPRESSION - I was unable to  visualize the region of the porta hepatitis (where the PET avid soft tissue was noted) due to his surgically altered anatomy    06/04/2020 Pathology Results    Liver lesion biopsy  Fine Needle Aspiration by Dr Mariah Milling at [facility], hypocellular soft tissue fragments.    See Comment.    Comment:  The patient's prior surgical pathology specimen ([id number]) was reviewed. Evidence of adenocarcinoma is absent in this specimen.  Evaluation is limited by scant cellularity.   A repeat tissue sampling procedure may be helpful, if clinically indicated   08/31/2020 Imaging   CT CAP at [facility]  Impression:   1. Increased recurrent soft tissue along the celiac trunk and common and  proper hepatic arteries with unchanged associated occlusion of the  intrahepatic left portal vein and occlusion of the accessory left hepatic  artery. Increased associated narrowing of the common and proper hepatic  arteries.  2. Increased intrahepatic biliary duct dilation likely due to  aforementioned malignant soft tissue tracking into the liver hilum. Suggest  correlation with lab values for biliary obstruction.    08/31/2020 Imaging   MRI abdomen  Impression:   1. No parenchymal abnormality in the previously identified area of concern  in the left hepatic lobe.  2. Similar-appearing atrophy of the left hepatic lobe with mildly increased  intrahepatic bile duct dilation.  3. Multifocal areas of arterial enhancement in the liver. At least 2 of  these demonstrate uptake on the hepatobiliary phase. Findings could  represent a combination of focal perfusion anomalies and FNH.  4. Ill-defined soft tissue surrounding the celiac trunk is better assessed  on same day CT.    08/31/2020 Imaging   MRI at [facility]  Impression:   1. No parenchymal abnormality in the previously identified area of concern  in the left hepatic lobe.  2. Similar-appearing atrophy of the left hepatic lobe with mildly increased   intrahepatic bile duct dilation.  3. Multifocal areas of arterial enhancement in the liver. At least 2 of  these demonstrate uptake on the hepatobiliary phase. Findings could  represent a combination of focal perfusion anomalies and FNH.  4. Ill-defined soft tissue surrounding the celiac trunk is better assessed  on same day CT.     08/31/2020 Imaging   CT CAP at [facility]  Impression:   1. Increased recurrent soft tissue along the celiac trunk and common and  proper hepatic arteries with unchanged associated occlusion of the  intrahepatic left portal vein and occlusion of the accessory left hepatic  artery. Increased associated narrowing of the common and proper hepatic  arteries.  2. Increased intrahepatic biliary duct dilation likely due to  aforementioned malignant soft tissue tracking into the liver hilum. Suggest  correlation with lab values for biliary obstruction.       CURRENT THERAPY:  Surveillance  INTERVAL HISTORY: *** PASCUAL MANTEL is here for a follow up. He presents to the clinic alone.    REVIEW OF SYSTEMS:  *** Constitutional: Denies fevers, chills or abnormal weight loss Eyes: Denies blurriness of vision Ears, nose, mouth, throat, and face: Denies mucositis or sore throat Respiratory: Denies cough, dyspnea  or wheezes Cardiovascular: Denies palpitation, chest discomfort or lower extremity swelling Gastrointestinal:  Denies nausea, heartburn or change in bowel habits Skin: Denies abnormal skin rashes Lymphatics: Denies new lymphadenopathy or easy bruising Neurological:Denies numbness, tingling or new weaknesses Behavioral/Psych: Mood is stable, no new changes  All other systems were reviewed with the patient and are negative.  MEDICAL HISTORY:  Past Medical History:  Diagnosis Date  . Arthritis    left hand  . Bronchitis 1977  . Cancer ([place]) 03/09/2016   pancreatic cancer, sees Dr. Cristino Martes at [facility]   . Depression    takes Cymbalta daily  . Diabetes  mellitus type II    sees Dr. Chalmers Cater   . GERD (gastroesophageal reflux disease)    takes Omeprazole daily  . H/O hiatal hernia   . Hyperlipidemia    takes Zocor daily  . Hypertension    takes Amlodipine daily  . Hypoglycemia 06/18/2017  . Neck pain    C4-7 stenosis and herniated disc  . Neuromuscular disorder ([place])    hiatal hernia  . Scoliosis    slight  . Spinal cord injury, C5-C7 ([place])    c4-c7  . Stiffness of hand joint    d/t cervical issues    SURGICAL HISTORY: Past Surgical History:  Procedure Laterality Date  . ANTERIOR CERVICAL DECOMP/DISCECTOMY FUSION  08/18/2011   Procedure: ANTERIOR CERVICAL DECOMPRESSION/DISCECTOMY FUSION 3 LEVELS;  Surgeon: Winfield Cunas, MD;  Location: [place] [facility];  Service: Neurosurgery;  Laterality: N/A;  Anterior Cervical Four-Five/Five-Six/Six-Seven Decompression with Fusion, Plating, and Bonegraft  . BURR HOLE Left 11/09/2020   Procedure: LEFT BURR HOLES FOR EVACUATION OF Subdural Hematoma;  Surgeon: Judith Part, MD;  Location: [place];  Service: Neurosurgery;  Laterality: Left;  . CARPAL TUNNEL RELEASE  2013   bilateral, per Dr. Christella Noa   . COLONOSCOPY  10-30-14   per Dr. Olevia Perches, clear, repeat in 10 yrs   . egd with esophageal dilation  9-08   per Dr. Olevia Perches  . ERCP N/A 03/01/2016   Procedure: ENDOSCOPIC RETROGRADE CHOLANGIOPANCREATOGRAPHY (ERCP) with brushings and stent;  Surgeon: Doran Stabler, MD;  Location: [facility];  Service: Endoscopy;  Laterality: N/A;  . ESOPHAGOGASTRODUODENOSCOPY (EGD) WITH PROPOFOL N/A 05/27/2020   Procedure: ESOPHAGOGASTRODUODENOSCOPY (EGD) WITH PROPOFOL;  Surgeon: Milus Banister, MD;  Location: [facility];  Service: Endoscopy;  Laterality: N/A;  . EUS N/A 03/09/2016   Procedure: ESOPHAGEAL ENDOSCOPIC ULTRASOUND (EUS) RADIAL;  Surgeon: Milus Banister, MD;  Location: [facility];  Service: Endoscopy;  Laterality: N/A;  . EUS N/A 05/27/2020   Procedure: UPPER ENDOSCOPIC ULTRASOUND (EUS) RADIAL;   Surgeon: Milus Banister, MD;  [facility];  Service: Endoscopy;  Laterality: N/A;  . FEMUR IM NAIL Right 10/25/2020   Procedure: INTRAMEDULLARY (IM) NAIL FEMORAL;  Surgeon: Rod Can, MD;  [facility];  Service: Orthopedics;  Laterality: Right;  . IR THORACENTESIS ASP PLEURAL SPACE W/IMG GUIDE  11/19/2020  . lymph nodes biopsy    . melanoma rt calf  1999  . PORT-A-CATH REMOVAL N/A 04/03/2019   Procedure: PORT REMOVAL;  Surgeon: Stark Klein, MD;  Location: [place];  Service: General;  Laterality: N/A;  . PORTACATH PLACEMENT Left 03/22/2016   Procedure: INSERTION PORT-A-CATH;  Surgeon: Stark Klein, MD;  [facility]: [facility];  Service: General;  Laterality: Left;  . SPINE SURGERY    . TONSILLECTOMY     as a child  . ULNAR TUNNEL RELEASE  2013   right arm, per  Dr. Christella Noa   . UPPER GASTROINTESTINAL ENDOSCOPY    . WHIPPLE PROCEDURE N/A 09/19/2016   Procedure: DIAGNOSTIC LAPAROSCOPY, LAPAROSCOPIC LIVER BIOPSY, RETROPERITONEAL EXPLORATION, INTRAOPERATIVE ULTRASOUND;  Surgeon: Stark Klein, MD;  Location: [place];  Service: General;  Laterality: N/A;    I have reviewed the social history and family history with the patient and they are unchanged from previous note.  ALLERGIES:  has No Known Allergies.  MEDICATIONS:  Current Outpatient Medications  Medication Sig Dispense Refill  . acetaminophen (TYLENOL) 325 MG tablet Take 1-2 tablets (325-650 mg total) by mouth every 4 (four) hours as needed for mild pain.    Marland Kitchen ascorbic acid (VITAMIN C) 500 MG tablet Take 1 tablet (500 mg total) by mouth daily. 30 tablet 0  . cefTRIAXone (ROCEPHIN) IVPB Inject 2 g into the vein every 12 (twelve) hours. Indication:  liver abscess and possible infected subdural hematoma First Dose: No Last Day of Therapy:  12/21/20 Labs - Once weekly:  CBC/D and BMP, Labs - Every other week:  ESR and CRP Method of administration: IV Push Method of administration may be changed at the discretion of home infusion  pharmacist based upon assessment of the patient and/or caregiver's ability to self-administer the medication ordered. 84 Units 0  . citalopram (CELEXA) 20 MG tablet Take 1 tablet (20 mg total) by mouth daily. 30 tablet 0  . diclofenac Sodium (VOLTAREN) 1 % GEL Apply 2 g topically 4 (four) times daily. To right knee 100 g 0  . hydrocerin (EUCERIN) CREA Apply 1 application topically 2 (two) times daily. To legs and back 454 g 0  . HYDROcodone-acetaminophen (NORCO/VICODIN) 5-325 MG tablet Take 1 tablet by mouth every 3 (three) hours as needed for severe pain. 30 tablet 0  . hydrocortisone cream 1 % Apply topically 2 (two) times daily. To back/areas of itching 30 g 0  . insulin aspart (NOVOLOG FLEXPEN) 100 UNIT/ML FlexPen Inject 2-13 Units into the skin 3 (three) times daily with meals. Sliding Scale insulin for blood sugars from 101-150 - 2 units. For blood sugars from 151-200 - 3 units. For blood sugars from 201-250 - 5 units. For blood sugars from 301-350 use 7 units. Call MD if blood sugars are over 300. 15 mL 0  . insulin detemir (LEVEMIR) 100 UNIT/ML FlexPen Inject 15 Units into the skin 2 (two) times daily. 15 mL 0  . Insulin Pen Needle (PEN NEEDLES) 32G X 6 MM MISC 1 application by Does not apply route as needed. 100 each 2  . levETIRAcetam (KEPPRA) 1000 MG tablet Take 1 tablet (1,000 mg total) by mouth 2 (two) times daily. 60 tablet 0  . lidocaine (LIDODERM) 5 % Place 1 patch onto the skin daily. Apply to lower back at 8 pm and remove at 8 am daily 30 patch 0  . lipase/protease/amylase 24000-76000 units CPEP Take 1 capsule (24,000 Units total) by mouth 3 (three) times daily before meals. 270 capsule 0  . loratadine (CLARITIN) 10 MG tablet Take 1 tablet (10 mg total) by mouth daily. 30 tablet 0  . metroNIDAZOLE (FLAGYL) 500 MG tablet Take 1 tablet (500 mg total) by mouth every 8 (eight) hours for 15 days. Treat through 12/21/20 45 tablet 0  . midodrine (PROAMATINE) 5 MG tablet Take 1 tablet (5 mg  total) by mouth 3 (three) times daily with meals. 90 tablet 0  . morphine (MS CONTIN) 30 MG 12 hr tablet Take 1 tablet (30 mg total) by mouth daily. 60 tablet 0  .  Multiple Vitamin (MULTIVITAMIN WITH MINERALS) TABS tablet Take 1 tablet by mouth daily.    Marland Kitchen omeprazole (PRILOSEC) 20 MG capsule TAKE 1 CAPSULE DAILY 90 capsule 3  . ondansetron (ZOFRAN-ODT) 4 MG disintegrating tablet Take 1 tablet (4 mg total) by mouth every 6 (six) hours as needed for nausea or vomiting (can alternate with compazine). 20 tablet 0  . polycarbophil (FIBERCON) 625 MG tablet Take 1 tablet (625 mg total) by mouth 2 (two) times daily. 60 tablet 0  . polyethylene glycol (MIRALAX / GLYCOLAX) 17 g packet Take 17 g by mouth daily as needed for mild constipation. 14 each 0  . potassium chloride (KLOR-CON) 20 MEQ packet Take 40 mEq by mouth daily. 30 each 0  . simethicone (MYLICON) 80 MG chewable tablet Chew 1 tablet (80 mg total) by mouth 4 (four) times daily -  with meals and at bedtime. 120 tablet 0  . tamsulosin (FLOMAX) 0.4 MG CAPS capsule Take 2 capsules (0.8 mg total) by mouth daily. 60 capsule 0  . zinc sulfate 220 (50 Zn) MG capsule Take 1 capsule (220 mg total) by mouth daily. 30 capsule 0   No current facility-administered medications for this visit.    PHYSICAL EXAMINATION: ECOG PERFORMANCE STATUS: {CHL ONC ECOG PS:[id number]}  There were no vitals filed for this visit. There were no vitals filed for this visit. *** GENERAL:alert, no distress and comfortable SKIN: skin color, texture, turgor are normal, no rashes or significant lesions EYES: normal, Conjunctiva are pink and non-injected, sclera clear {OROPHARYNX:no exudate, no erythema and lips, buccal mucosa, and tongue normal}  NECK: supple, thyroid normal size, non-tender, without nodularity LYMPH:  no palpable lymphadenopathy in the cervical, axillary {or inguinal} LUNGS: clear to auscultation and percussion with normal breathing effort HEART: regular  rate & rhythm and no murmurs and no lower extremity edema ABDOMEN:abdomen soft, non-tender and normal bowel sounds Musculoskeletal:no cyanosis of digits and no clubbing  NEURO: alert & oriented x 3 with fluent speech, no focal motor/sensory deficits  LABORATORY DATA:  I have reviewed the data as listed CBC Latest Ref Rng & Units 12/09/2020 12/06/2020 12/05/2020  WBC 4.0 - 10.5 K/uL 14.5(H) 7.2 6.1  Hemoglobin 13.0 - 17.0 g/dL 10.9(L) 10.0(L) 9.7(L)  Hematocrit 39.0 - 52.0 % 33.7(L) 30.8(L) 30.2(L)  Platelets 150 - 400 K/uL 577(H) 491(H) 504(H)     CMP Latest Ref Rng & Units 12/09/2020 12/06/2020 12/05/2020  Glucose 70 - 99 mg/dL 338(H) 153(H) 104(H)  BUN 8 - 23 mg/dL 8 7(L) 6(L)  Creatinine 0.61 - 1.24 mg/dL 0.70 0.50(L) 0.47(L)  Sodium 135 - 145 mmol/L 135 135 133(L)  Potassium 3.5 - 5.1 mmol/L 4.0 4.5 3.9  Chloride 98 - 111 mmol/L 100 100 102  CO2 22 - 32 mmol/L 24 29 27   Calcium 8.9 - 10.3 mg/dL 9.1 8.7(L) 8.3(L)  Total Protein 6.5 - 8.1 g/dL 8.3(H) 6.9 -  Total Bilirubin 0.3 - 1.2 mg/dL 0.3 0.3 -  Alkaline Phos 38 - 126 U/L 1,715(H) 772(H) -  AST 15 - 41 U/L 86(H) 28 -  ALT 0 - 44 U/L 50(H) 15 -      RADIOGRAPHIC STUDIES: I have personally reviewed the radiological images as listed and agreed with the findings in the report. No results found.   ASSESSMENT & PLAN:  Colton Bush is a 65 y.o. male with   1. Primary pancreatic adenocarcinoma, in pancreatic head, cT2N1M0, stage IIB, ypT2N0M0.Probable local recurrence in 08/2020 -He was diagnosed in 02/2016. He is s/p  neoadjuvant FOLFIRINOX, concurrentchemoRT with Xeloda[facility].He is currently on surveillance. -His 01/2020 CT scan with [facility] showed stable soft tissue at the surgical bed, no other evidence of metastasis. -[facility], overall concerning for cancer recurrence.Ca 19-9 at 1125 on 04/14/20. -HisPET from 04/26/20  showedhypermetabolicuptake atthe porta hepatis surgical site which is suspicious for local cancer recurrence. No evidence of distant metastasis. He remains asymptomatic. -Dr Ardis Hughs was unable to obtain a biopsy with EUS on 05/27/20.He was seen by Dr Mariah Milling [facility] underwent EUS biopsyon 06/04/20, path was negative. -His 08/31/20 CT and [facility] showed increased soft tissue at surgical bed, no evidence of distant metastasis.  -He underwent intragastric biopsy of the soft tissue around the celiac artery at [facility] on September 24, 2020.  I reviewed the pathology report, and discussed with the pathologist Dr. Maida Sale.  Due to the limited sample, scant cells, it is not definitive, but suspicious for metastatic adenocarcinoma.  The immunostain consistent with pancreatobiliary origin. -I previously discussed with Dr. Mariah Milling, will does not think he can offer surgery to completely resectable local recurrence. -I recommend radiation for his probable local recurrence, based on his rising CA19.9, CT scan findings and the recent biopsy result.  I also discussed the option of systemic chemotherapy.  Also post radiation and systemic therapy are likely palliative, not curative, I do think radiation has better chance for local disease control, and overall has less side effects, given he is asymptomatic with good quality of life now. -Patient agrees with the plan.  I will reach out to Dr. Mariah Milling and Dr. [name] to get their opinion.  Patient underwent neoadjuvant radiation about 4 years ago.  If he is not a candidate for second round of radiation, I will reach out to to [facility] rad/onc to see if they can offer proton radiation  -f/u open for now   2. Type 2 DM, HTN -He will continue medication and follow up with his primary care physician Dr. Sarajane Jews  3. Chronic Diarrhea  -Secondary to whipple surgery  -Well controlled/normal on Creon, will continue.Stable.  Plan -path reviewed, I spoke with pathologist  Dr. Maida Sale at [facility]  -I recommend radiation for his local recurrence, will refer him to Dr. [name] -copy Dr. Mariah Milling    No problem-specific Assessment & Plan notes found for this encounter.   No orders of the defined types were placed in this encounter.  All questions were answered. The patient knows to call the clinic with any problems, questions or concerns. No barriers to learning was detected. The total time spent in the appointment was {CHL ONC TIME VISIT - [id number]}.     Colton Bush 12/13/2022   Oneal Deputy, am acting as scribe for Truitt Merle, MD.   {Add scribe attestation statement}

## 2020-12-13 NOTE — ED Notes (Signed)
After 4 orange juices, patient CBG is 92.

## 2020-12-13 NOTE — Discharge Instructions (Signed)
Continue to use the pain medicines he have at home.  Continue to take the potassium.  Follow-up with your doctor soon as possible.

## 2020-12-13 NOTE — Telephone Encounter (Signed)
Scheduled appt per 5/13 sch msg. Called pt's wife, no answer. Left msg with appt date and time.

## 2020-12-13 NOTE — ED Notes (Signed)
Patient stated her took 20 units levemir today and has not eat anything today.  CBG is 61, patient given orange juice.  Will recheck sugar in 15 mins.

## 2020-12-14 ENCOUNTER — Ambulatory Visit: Payer: Medicare Other | Admitting: Radiation Oncology

## 2020-12-14 DIAGNOSIS — E162 Hypoglycemia, unspecified: Secondary | ICD-10-CM | POA: Diagnosis not present

## 2020-12-14 DIAGNOSIS — R4020 Unspecified coma: Secondary | ICD-10-CM | POA: Diagnosis not present

## 2020-12-14 DIAGNOSIS — E161 Other hypoglycemia: Secondary | ICD-10-CM | POA: Diagnosis not present

## 2020-12-14 NOTE — Telephone Encounter (Signed)
Colton Bush has been advised to follow up with his PCP asap.

## 2020-12-14 NOTE — Telephone Encounter (Signed)
Thank you- I checked- they discharged him from ED last night- I know wife is trying to get him back in nursing home, but insurance won't cover it due to how long he was in in last 12 months- unfortunately, I don't have an answer to this situation- He needs to see his PCP asap since the issues are more medical, than functional- I spoke to therapy and there's no way to take him back to inpt rehab -he had plateaued.  Thanks, ML

## 2020-12-15 ENCOUNTER — Other Ambulatory Visit: Payer: Medicare Other

## 2020-12-15 ENCOUNTER — Emergency Department (HOSPITAL_COMMUNITY): Payer: Medicare Other

## 2020-12-15 ENCOUNTER — Encounter (HOSPITAL_COMMUNITY): Payer: Self-pay

## 2020-12-15 ENCOUNTER — Ambulatory Visit: Payer: Medicare Other | Admitting: Radiation Oncology

## 2020-12-15 ENCOUNTER — Ambulatory Visit: Payer: Medicare Other | Admitting: Hematology

## 2020-12-15 ENCOUNTER — Other Ambulatory Visit: Payer: Self-pay

## 2020-12-15 ENCOUNTER — Telehealth: Payer: Self-pay | Admitting: Hematology

## 2020-12-15 ENCOUNTER — Inpatient Hospital Stay (HOSPITAL_COMMUNITY)
Admission: EM | Admit: 2020-12-15 | Discharge: 2020-12-22 | DRG: 177 | Disposition: A | Discharge status: Skilled Nursing Facility | Payer: Medicare Other | Attending: Family Medicine | Admitting: Family Medicine

## 2020-12-15 DIAGNOSIS — K750 Abscess of liver: Secondary | ICD-10-CM | POA: Diagnosis not present

## 2020-12-15 DIAGNOSIS — M1711 Unilateral primary osteoarthritis, right knee: Secondary | ICD-10-CM | POA: Diagnosis present

## 2020-12-15 DIAGNOSIS — Z9221 Personal history of antineoplastic chemotherapy: Secondary | ICD-10-CM

## 2020-12-15 DIAGNOSIS — B954 Other streptococcus as the cause of diseases classified elsewhere: Secondary | ICD-10-CM | POA: Diagnosis not present

## 2020-12-15 DIAGNOSIS — I10 Essential (primary) hypertension: Secondary | ICD-10-CM | POA: Diagnosis not present

## 2020-12-15 DIAGNOSIS — K219 Gastro-esophageal reflux disease without esophagitis: Secondary | ICD-10-CM | POA: Diagnosis not present

## 2020-12-15 DIAGNOSIS — Z87820 Personal history of traumatic brain injury: Secondary | ICD-10-CM

## 2020-12-15 DIAGNOSIS — E162 Hypoglycemia, unspecified: Secondary | ICD-10-CM

## 2020-12-15 DIAGNOSIS — R0602 Shortness of breath: Secondary | ICD-10-CM | POA: Diagnosis not present

## 2020-12-15 DIAGNOSIS — N320 Bladder-neck obstruction: Secondary | ICD-10-CM | POA: Diagnosis not present

## 2020-12-15 DIAGNOSIS — L89616 Pressure-induced deep tissue damage of right heel: Secondary | ICD-10-CM | POA: Diagnosis present

## 2020-12-15 DIAGNOSIS — F32A Depression, unspecified: Secondary | ICD-10-CM | POA: Diagnosis not present

## 2020-12-15 DIAGNOSIS — E782 Mixed hyperlipidemia: Secondary | ICD-10-CM

## 2020-12-15 DIAGNOSIS — R54 Age-related physical debility: Secondary | ICD-10-CM | POA: Diagnosis present

## 2020-12-15 DIAGNOSIS — G40909 Epilepsy, unspecified, not intractable, without status epilepticus: Secondary | ICD-10-CM | POA: Diagnosis present

## 2020-12-15 DIAGNOSIS — Z20822 Contact with and (suspected) exposure to covid-19: Secondary | ICD-10-CM | POA: Diagnosis not present

## 2020-12-15 DIAGNOSIS — Z1621 Resistance to vancomycin: Secondary | ICD-10-CM | POA: Diagnosis not present

## 2020-12-15 DIAGNOSIS — F419 Anxiety disorder, unspecified: Secondary | ICD-10-CM | POA: Diagnosis not present

## 2020-12-15 DIAGNOSIS — E876 Hypokalemia: Secondary | ICD-10-CM | POA: Diagnosis present

## 2020-12-15 DIAGNOSIS — Z79899 Other long term (current) drug therapy: Secondary | ICD-10-CM

## 2020-12-15 DIAGNOSIS — B952 Enterococcus as the cause of diseases classified elsewhere: Secondary | ICD-10-CM | POA: Diagnosis present

## 2020-12-15 DIAGNOSIS — R001 Bradycardia, unspecified: Secondary | ICD-10-CM | POA: Diagnosis present

## 2020-12-15 DIAGNOSIS — C259 Malignant neoplasm of pancreas, unspecified: Secondary | ICD-10-CM | POA: Diagnosis present

## 2020-12-15 DIAGNOSIS — Z823 Family history of stroke: Secondary | ICD-10-CM

## 2020-12-15 DIAGNOSIS — Z981 Arthrodesis status: Secondary | ICD-10-CM

## 2020-12-15 DIAGNOSIS — Z794 Long term (current) use of insulin: Secondary | ICD-10-CM

## 2020-12-15 DIAGNOSIS — E11649 Type 2 diabetes mellitus with hypoglycemia without coma: Secondary | ICD-10-CM | POA: Diagnosis not present

## 2020-12-15 DIAGNOSIS — C250 Malignant neoplasm of head of pancreas: Secondary | ICD-10-CM | POA: Diagnosis present

## 2020-12-15 DIAGNOSIS — E1165 Type 2 diabetes mellitus with hyperglycemia: Secondary | ICD-10-CM | POA: Diagnosis present

## 2020-12-15 DIAGNOSIS — Z90411 Acquired partial absence of pancreas: Secondary | ICD-10-CM

## 2020-12-15 DIAGNOSIS — Z8249 Family history of ischemic heart disease and other diseases of the circulatory system: Secondary | ICD-10-CM

## 2020-12-15 DIAGNOSIS — Z833 Family history of diabetes mellitus: Secondary | ICD-10-CM

## 2020-12-15 DIAGNOSIS — J869 Pyothorax without fistula: Secondary | ICD-10-CM

## 2020-12-15 DIAGNOSIS — I499 Cardiac arrhythmia, unspecified: Secondary | ICD-10-CM | POA: Diagnosis not present

## 2020-12-15 DIAGNOSIS — E161 Other hypoglycemia: Secondary | ICD-10-CM | POA: Diagnosis not present

## 2020-12-15 DIAGNOSIS — Z8781 Personal history of (healed) traumatic fracture: Secondary | ICD-10-CM

## 2020-12-15 DIAGNOSIS — I951 Orthostatic hypotension: Secondary | ICD-10-CM | POA: Diagnosis present

## 2020-12-15 DIAGNOSIS — R402431 Glasgow coma scale score 3-8, in the field [EMT or ambulance]: Secondary | ICD-10-CM | POA: Diagnosis not present

## 2020-12-15 DIAGNOSIS — N400 Enlarged prostate without lower urinary tract symptoms: Secondary | ICD-10-CM | POA: Diagnosis present

## 2020-12-15 DIAGNOSIS — R404 Transient alteration of awareness: Secondary | ICD-10-CM | POA: Diagnosis not present

## 2020-12-15 DIAGNOSIS — Z923 Personal history of irradiation: Secondary | ICD-10-CM

## 2020-12-15 LAB — COMPREHENSIVE METABOLIC PANEL
ALT: 26 U/L (ref 0–44)
AST: 21 U/L (ref 15–41)
Albumin: 2.7 g/dL — ABNORMAL LOW (ref 3.5–5.0)
Alkaline Phosphatase: 1159 U/L — ABNORMAL HIGH (ref 38–126)
Anion gap: 7 (ref 5–15)
BUN: 10 mg/dL (ref 8–23)
CO2: 31 mmol/L (ref 22–32)
Calcium: 8.3 mg/dL — ABNORMAL LOW (ref 8.9–10.3)
Chloride: 101 mmol/L (ref 98–111)
Creatinine, Ser: 0.47 mg/dL — ABNORMAL LOW (ref 0.61–1.24)
GFR, Estimated: >60 mL/min (ref >60)
Glucose, Bld: 89 mg/dL (ref 70–99)
Potassium: 2.6 mmol/L — CL (ref 3.5–5.1)
Sodium: 139 mmol/L (ref 135–145)
Total Bilirubin: 0.7 mg/dL (ref 0.3–1.2)
Total Protein: 6.9 g/dL (ref 6.5–8.1)

## 2020-12-15 LAB — CBC WITH DIFFERENTIAL/PLATELET
Abs Immature Granulocytes: 0.05 10*3/uL (ref 0.00–0.07)
Basophils Absolute: 0.1 10*3/uL (ref 0.0–0.1)
Basophils Relative: 0 %
Eosinophils Absolute: 0.4 10*3/uL (ref 0.0–0.5)
Eosinophils Relative: 3 %
HCT: 32.7 % — ABNORMAL LOW (ref 39.0–52.0)
Hemoglobin: 10.4 g/dL — ABNORMAL LOW (ref 13.0–17.0)
Immature Granulocytes: 0 %
Lymphocytes Relative: 4 %
Lymphs Abs: 0.4 10*3/uL — ABNORMAL LOW (ref 0.7–4.0)
MCH: 28.6 pg (ref 26.0–34.0)
MCHC: 31.8 g/dL (ref 30.0–36.0)
MCV: 89.8 fL (ref 80.0–100.0)
Monocytes Absolute: 0.6 10*3/uL (ref 0.1–1.0)
Monocytes Relative: 5 %
Neutro Abs: 10.1 10*3/uL — ABNORMAL HIGH (ref 1.7–7.7)
Neutrophils Relative %: 88 %
Platelets: 374 10*3/uL (ref 150–400)
RBC: 3.64 MIL/uL — ABNORMAL LOW (ref 4.22–5.81)
RDW: 18.4 % — ABNORMAL HIGH (ref 11.5–15.5)
WBC: 11.6 10*3/uL — ABNORMAL HIGH (ref 4.0–10.5)
nRBC: 0 % (ref 0.0–0.2)

## 2020-12-15 LAB — I-STAT CHEM 8, ED
BUN: 11 mg/dL (ref 8–23)
Calcium, Ion: 1.11 mmol/L — ABNORMAL LOW (ref 1.15–1.40)
Chloride: 98 mmol/L (ref 98–111)
Creatinine, Ser: 0.3 mg/dL — ABNORMAL LOW (ref 0.61–1.24)
Glucose, Bld: 84 mg/dL (ref 70–99)
HCT: 34 % — ABNORMAL LOW (ref 39.0–52.0)
Hemoglobin: 11.6 g/dL — ABNORMAL LOW (ref 13.0–17.0)
Potassium: 2.7 mmol/L — CL (ref 3.5–5.1)
Sodium: 140 mmol/L (ref 135–145)
TCO2: 29 mmol/L (ref 22–32)

## 2020-12-15 LAB — LACTIC ACID, PLASMA
Lactic Acid, Venous: 0.9 mmol/L (ref 0.5–1.9)
Lactic Acid, Venous: 1.8 mmol/L (ref 0.5–1.9)

## 2020-12-15 LAB — TROPONIN I (HIGH SENSITIVITY)
Troponin I (High Sensitivity): 16 ng/L (ref <18)
Troponin I (High Sensitivity): 14 ng/L (ref <18)
Troponin I (High Sensitivity): 18 ng/L — ABNORMAL HIGH (ref <18)

## 2020-12-15 LAB — RESP PANEL BY RT-PCR (FLU A&B, COVID) ARPGX2
Influenza A by PCR: NEGATIVE
Influenza B by PCR: NEGATIVE
SARS Coronavirus 2 by RT PCR: NEGATIVE

## 2020-12-15 LAB — PROTIME-INR
INR: 1.2 (ref 0.8–1.2)
Prothrombin Time: 15.6 seconds — ABNORMAL HIGH (ref 11.4–15.2)

## 2020-12-15 LAB — CBG MONITORING, ED
Glucose-Capillary: 90 mg/dL (ref 70–99)
Glucose-Capillary: 70 mg/dL (ref 70–99)
Glucose-Capillary: 114 mg/dL — ABNORMAL HIGH (ref 70–99)
Glucose-Capillary: 158 mg/dL — ABNORMAL HIGH (ref 70–99)

## 2020-12-15 LAB — MAGNESIUM: Magnesium: 2.3 mg/dL (ref 1.7–2.4)

## 2020-12-15 LAB — GLUCOSE, CAPILLARY: Glucose-Capillary: 274 mg/dL — ABNORMAL HIGH (ref 70–99)

## 2020-12-15 MED ORDER — CITALOPRAM HYDROBROMIDE 20 MG PO TABS
20.0000 mg | ORAL_TABLET | Freq: Every day | ORAL | Status: DC
  Administered 2020-12-15 – 2020-12-19 (×5): 20 mg via ORAL
  Filled 2020-12-15 (×4): qty 1
  Filled 2020-12-15: qty 2

## 2020-12-15 MED ORDER — PANCRELIPASE (LIP-PROT-AMYL) 12000-38000 UNITS PO CPEP
24000.0000 [IU] | ORAL_CAPSULE | Freq: Three times a day (TID) | ORAL | Status: DC
  Administered 2020-12-15 – 2020-12-22 (×20): 24000 [IU] via ORAL
  Filled 2020-12-15 (×20): qty 2

## 2020-12-15 MED ORDER — ACETAMINOPHEN 650 MG RE SUPP: 650.0000 mg | Freq: Four times a day (QID) | RECTAL | Status: DC | PRN

## 2020-12-15 MED ORDER — ACETAMINOPHEN 325 MG PO TABS
650.0000 mg | ORAL_TABLET | Freq: Four times a day (QID) | ORAL | Status: DC | PRN
  Administered 2020-12-18 – 2020-12-20 (×3): 650 mg via ORAL
  Filled 2020-12-15 (×3): qty 2

## 2020-12-15 MED ORDER — TAMSULOSIN HCL 0.4 MG PO CAPS
0.8000 mg | ORAL_CAPSULE | Freq: Every day | ORAL | Status: DC
  Administered 2020-12-16 – 2020-12-22 (×7): 0.8 mg via ORAL
  Filled 2020-12-15 (×7): qty 2

## 2020-12-15 MED ORDER — POTASSIUM CHLORIDE CRYS ER 20 MEQ PO TBCR
60.0000 meq | EXTENDED_RELEASE_TABLET | ORAL | Status: AC
  Administered 2020-12-15: 60 meq via ORAL
  Filled 2020-12-15: qty 3

## 2020-12-15 MED ORDER — HYDROCORTISONE 1 % EX CREA
TOPICAL_CREAM | Freq: Two times a day (BID) | CUTANEOUS | Status: DC
  Administered 2020-12-17 – 2020-12-21 (×3): 1 via TOPICAL
  Filled 2020-12-15: qty 28

## 2020-12-15 MED ORDER — LOPERAMIDE HCL 2 MG PO CAPS
2.0000 mg | ORAL_CAPSULE | Freq: Every day | ORAL | Status: DC | PRN
  Filled 2020-12-15: qty 1

## 2020-12-15 MED ORDER — ENOXAPARIN SODIUM 40 MG/0.4ML IJ SOSY
40.0000 mg | PREFILLED_SYRINGE | INTRAMUSCULAR | Status: DC
  Administered 2020-12-15 – 2020-12-21 (×7): 40 mg via SUBCUTANEOUS
  Filled 2020-12-15 (×7): qty 0.4

## 2020-12-15 MED ORDER — HYDROCODONE-ACETAMINOPHEN 5-325 MG PO TABS: 1.0000 | ORAL_TABLET | ORAL | Status: DC | PRN

## 2020-12-15 MED ORDER — ONDANSETRON HCL 4 MG/2ML IJ SOLN: 4.0000 mg | Freq: Four times a day (QID) | INTRAMUSCULAR | Status: DC | PRN

## 2020-12-15 MED ORDER — DICLOFENAC SODIUM 1 % EX GEL
2.0000 g | Freq: Four times a day (QID) | CUTANEOUS | Status: DC
  Administered 2020-12-15 – 2020-12-22 (×20): 2 g via TOPICAL
  Filled 2020-12-15: qty 100

## 2020-12-15 MED ORDER — POTASSIUM CHLORIDE 10 MEQ/100ML IV SOLN
10.0000 meq | Freq: Once | INTRAVENOUS | Status: AC
  Administered 2020-12-15: 10 meq via INTRAVENOUS
  Filled 2020-12-15: qty 100

## 2020-12-15 MED ORDER — ALBUTEROL SULFATE (2.5 MG/3ML) 0.083% IN NEBU: 2.5000 mg | INHALATION_SOLUTION | Freq: Four times a day (QID) | RESPIRATORY_TRACT | Status: DC | PRN

## 2020-12-15 MED ORDER — SODIUM CHLORIDE 0.9% FLUSH
3.0000 mL | Freq: Two times a day (BID) | INTRAVENOUS | Status: DC
  Administered 2020-12-15 – 2020-12-22 (×12): 3 mL via INTRAVENOUS

## 2020-12-15 MED ORDER — CEFTRIAXONE IV (FOR PTA / DISCHARGE USE ONLY): 2.0000 g | Freq: Two times a day (BID) | INTRAVENOUS | Status: DC

## 2020-12-15 MED ORDER — ONDANSETRON HCL 4 MG PO TABS: 4.0000 mg | ORAL_TABLET | Freq: Four times a day (QID) | ORAL | Status: DC | PRN

## 2020-12-15 MED ORDER — MIDODRINE HCL 5 MG PO TABS
5.0000 mg | ORAL_TABLET | Freq: Three times a day (TID) | ORAL | Status: DC
  Administered 2020-12-15 – 2020-12-22 (×20): 5 mg via ORAL
  Filled 2020-12-15 (×20): qty 1

## 2020-12-15 MED ORDER — LEVETIRACETAM 500 MG PO TABS
1000.0000 mg | ORAL_TABLET | Freq: Two times a day (BID) | ORAL | Status: DC
  Administered 2020-12-15 – 2020-12-22 (×14): 1000 mg via ORAL
  Filled 2020-12-15 (×14): qty 2

## 2020-12-15 MED ORDER — PANTOPRAZOLE SODIUM 40 MG PO TBEC
40.0000 mg | DELAYED_RELEASE_TABLET | Freq: Every day | ORAL | Status: DC
  Administered 2020-12-15 – 2020-12-22 (×8): 40 mg via ORAL
  Filled 2020-12-15 (×8): qty 1

## 2020-12-15 MED ORDER — METRONIDAZOLE 500 MG PO TABS
500.0000 mg | ORAL_TABLET | Freq: Three times a day (TID) | ORAL | Status: DC
  Administered 2020-12-15 – 2020-12-17 (×7): 500 mg via ORAL
  Filled 2020-12-15 (×7): qty 1

## 2020-12-15 MED ORDER — INSULIN ASPART 100 UNIT/ML IJ SOLN: 0.0000 [IU] | INTRAMUSCULAR | Status: DC

## 2020-12-15 MED ORDER — POTASSIUM CHLORIDE CRYS ER 20 MEQ PO TBCR
40.0000 meq | EXTENDED_RELEASE_TABLET | ORAL | Status: DC
  Filled 2020-12-15: qty 2

## 2020-12-15 MED ORDER — LIDOCAINE 5 % EX PTCH
1.0000 | MEDICATED_PATCH | CUTANEOUS | Status: DC
  Administered 2020-12-15 – 2020-12-21 (×7): 1 via TRANSDERMAL
  Filled 2020-12-15 (×7): qty 1

## 2020-12-15 MED ORDER — SODIUM CHLORIDE 0.9 % IV SOLN
2.0000 g | Freq: Two times a day (BID) | INTRAVENOUS | Status: DC
  Administered 2020-12-15 – 2020-12-17 (×4): 2 g via INTRAVENOUS
  Filled 2020-12-15 (×4): qty 20

## 2020-12-15 MED ORDER — DEXTROSE 10 % IV SOLN: INTRAVENOUS | Status: DC

## 2020-12-15 MED ORDER — HYDROCERIN EX CREA
1.0000 "application " | TOPICAL_CREAM | Freq: Two times a day (BID) | CUTANEOUS | Status: DC
  Administered 2020-12-15 – 2020-12-22 (×12): 1 via TOPICAL
  Filled 2020-12-15: qty 113

## 2020-12-15 MED ORDER — POTASSIUM CHLORIDE 10 MEQ/100ML IV SOLN
10.0000 meq | INTRAVENOUS | Status: AC
  Administered 2020-12-15 (×2): 10 meq via INTRAVENOUS
  Filled 2020-12-15 (×2): qty 100

## 2020-12-15 MED ORDER — POTASSIUM CHLORIDE 20 MEQ PO PACK: 40.0000 meq | PACK | Freq: Every day | ORAL | Status: DC

## 2020-12-15 NOTE — ED Triage Notes (Signed)
Pt comes from home via EMS. Pt had episode of hypoglycemia last night was treated by EMS and chose not to come to hospital. Pt reportedly took PM insulin and went to bed. Wife called EMS this morning due to inability to arouse. EMS arrived, Pt pale, diaphoretic, GCS 3. BGL 42. Pt given 250 ml bag of D10. Pt arousable to voice and touch at this time but lethargic.  CBG 125 10 minutes from arrival BP 100/60 HR 90 RR 18 O2 100% on RA 20g IV LAC

## 2020-12-15 NOTE — H&P (Signed)
History and Physical    Colton Bush:[id number] DOB: 1956-05-04 DOA: 12/15/2020  Referring MD/NP/PA: Dene Gentry, MD PCP: [name], MD  Patient coming from: home via EMS  Chief Complaint: Unresponsive  I have personally briefly reviewed patient's old medical records in [place]   HPI: Colton Bush is a 65 y.o. male with medical history significant of hypertension, hyperlipidemia, recurrent pancreatic cancer who presents after his wife found him unresponsive this morning.  Most of the history is obtained from the patient's wife who is present at bedside.  She states that normally he is up before she is, but this morning he was still in bed.  After little while she went and checked on him and noticed that he was drooling of the side of his mouth and had sonorous respirations.  Denied seeing any seizure-like activity.  Attempted multiple times to wake him and thereafter called 911.  They found his blood sugar to be around 41.  Patient had last taken Levemir approximately 20units the night before, but did not eat dinner.  Since he had been home from rehab he reportedly had been eating regularly.  However, patient reports that he recently he had just started feeling bad over the last 2 to 3 days.  Wife also reports that over the last 2 to 3 days his blood sugars that intermittently been dropping into the 60-70s at home.  After he was discharged from rehab he reported that they sent him home on MS Contin as well as hydrocodone, but he did not like the way the made him feel and has not really taken it.  Patient just recently been hospitalized for right hip fracture status post intramedullary nail in 09/2020. His hospital course was complicated E. coli UTI along with strep anginosis bacteremia in 1 of 4 blood cultures treated with Rocephin and then amoxicillin for total of 7 days.  Patient was sent to a rehab facility, but subsequently had to be hospitalized from 4/8-4/15 with severe  sepsis found to have liver abscesses status post JP drain x2 and right empyema.  During his hospitalization patient was noted to be acutely confused for which imaging studies of the brain revealed subdural hematomas requiring bur holes.  He was seen by ID and recommended to continue on Rocephin and oral metronidazole to complete a total of 6 weeks(5/24). Patient was noted to have concern for probable seizures for which he was started on Keppra.  Patient was he went to inpatient rehab from 4/15-5/11.  After that he was able to be discharged from rehab he presented back to the emergency department on 5/12 and again on 5/16 for weakness.  ED Course: On admission into the emergency department patient was seen to afebrile vital signs relatively within normal limits.  CT scan of the brain showed no acute changes. labs significant for WBC 11.6, hemoglobin 10.4, potassium 2.6, and glucose down as low as 70.  Chest x-ray showed unchanged right midlung opacity representing known right pleural effusion collection.  Patient was started on dextrose 10 fusion at 75 mL/h and given potassium chloride 10 mEq IV x1 dose.  Review of Systems  Constitutional: Positive for malaise/fatigue.  Otherwise a complete review of systems was performed and negative except for as noted above in the HPI.  Past Medical History:  Diagnosis Date  . Arthritis    left hand  . Bronchitis 1977  . Cancer ([place]) 03/09/2016   pancreatic cancer, sees Dr. Cristino Martes at [facility]   .  Depression    takes Cymbalta daily  . Diabetes mellitus type II    sees Dr. Chalmers Cater   . GERD (gastroesophageal reflux disease)    takes Omeprazole daily  . H/O hiatal hernia   . Hyperlipidemia    takes Zocor daily  . Hypertension    takes Amlodipine daily  . Hypoglycemia 06/18/2017  . Neck pain    C4-7 stenosis and herniated disc  . Neuromuscular disorder ([place])    hiatal hernia  . Scoliosis    slight  . Spinal cord injury, C5-C7 ([place])    c4-c7  .  Stiffness of hand joint    d/t cervical issues    Past Surgical History:  Procedure Laterality Date  . ANTERIOR CERVICAL DECOMP/DISCECTOMY FUSION  08/18/2011   Procedure: ANTERIOR CERVICAL DECOMPRESSION/DISCECTOMY FUSION 3 LEVELS;  Surgeon: Winfield Cunas, MD;  Location: [facility];  Service: Neurosurgery;  Laterality: N/A;  Anterior Cervical Four-Five/Five-Six/Six-Seven Decompression with Fusion, Plating, and Bonegraft  . BURR HOLE Left 11/09/2020   Procedure: LEFT BURR HOLES FOR EVACUATION OF Subdural Hematoma;  Surgeon: Judith Part, MD;  Location: [facility];  Service: Neurosurgery;  Laterality: Left;  . CARPAL TUNNEL RELEASE  2013   bilateral, per Dr. Christella Noa   . COLONOSCOPY  10-30-14   per Dr. Olevia Perches, clear, repeat in 10 yrs   . egd with esophageal dilation  9-08   per Dr. Olevia Perches  . ERCP N/A 03/01/2016   Procedure: ENDOSCOPIC RETROGRADE CHOLANGIOPANCREATOGRAPHY (ERCP) with brushings and stent;  Surgeon: Doran Stabler, MD;  Location: [facility];  Service: Endoscopy;  Laterality: N/A;  . ESOPHAGOGASTRODUODENOSCOPY (EGD) WITH PROPOFOL N/A 05/27/2020   Procedure: ESOPHAGOGASTRODUODENOSCOPY (EGD) WITH PROPOFOL;  Surgeon: Milus Banister, MD;  Location: [facility];  Service: Endoscopy;  Laterality: N/A;  . EUS N/A 03/09/2016   Procedure: ESOPHAGEAL ENDOSCOPIC ULTRASOUND (EUS) RADIAL;  Surgeon: Milus Banister, MD;  Location[facility];  Service: Endoscopy;  Laterality: N/A;  . EUS N/A 05/27/2020   Procedure: UPPER ENDOSCOPIC ULTRASOUND (EUS) RADIAL;  Surgeon: Milus Banister, MD;  Location: [facility];  Service: Endoscopy;  Laterality: N/A;  . FEMUR IM NAIL Right 10/25/2020   Procedure: INTRAMEDULLARY (IM) NAIL FEMORAL;  Surgeon: Rod Can, MD;  Location: [facility];  Service: Orthopedics;  Laterality: Right;  . IR THORACENTESIS ASP PLEURAL SPACE W/IMG GUIDE  11/19/2020  . lymph nodes biopsy    . melanoma rt calf  1999  . PORT-A-CATH REMOVAL N/A 04/03/2019   Procedure: PORT  REMOVAL;  Surgeon: Stark Klein, MD;  Location: [place];  Service: General;  Laterality: N/A;  . PORTACATH PLACEMENT Left 03/22/2016   Procedure: INSERTION PORT-A-CATH;  Surgeon: Stark Klein, MD;  Location: [facility];  Service: General;  Laterality: Left;  . SPINE SURGERY    . TONSILLECTOMY     as a child  . ULNAR TUNNEL RELEASE  2013   right arm, per Dr. Christella Noa   . UPPER GASTROINTESTINAL ENDOSCOPY    . WHIPPLE PROCEDURE N/A 09/19/2016   Procedure: DIAGNOSTIC LAPAROSCOPY, LAPAROSCOPIC LIVER BIOPSY, RETROPERITONEAL EXPLORATION, INTRAOPERATIVE ULTRASOUND;  Surgeon: Stark Klein, MD;  Location: [place];  Service: General;  Laterality: N/A;     reports that he has never smoked. He has never used smokeless tobacco. He reports that he does not drink alcohol and does not use drugs.  No Known Allergies  Family History  Problem Relation Age of Onset  . Heart disease Father   . Heart disease Brother 4  . Anesthesia  problems Mother   . Heart disease Mother   . Dementia Mother   . Diabetes Sister   . Stroke Sister   . Colon cancer Neg Hx   . Rectal cancer Neg Hx   . Stomach cancer Neg Hx     Prior to Admission medications   Medication Sig Start Date End Date Taking? Authorizing Provider  acetaminophen (TYLENOL) 325 MG tablet Take 1-2 tablets (325-650 mg total) by mouth every 4 (four) hours as needed for mild pain. 12/08/20  Yes Angiulli, Lavon Paganini, PA-C  ascorbic acid (VITAMIN C) 500 MG tablet Take 1 tablet (500 mg total) by mouth daily. 12/08/20  Yes Angiulli, Lavon Paganini, PA-C  cefTRIAXone (ROCEPHIN) IVPB Inject 2 g into the vein every 12 (twelve) hours. Indication:  liver abscess and possible infected subdural hematoma First Dose: No Last Day of Therapy:  12/21/20 Labs - Once weekly:  CBC/D and BMP, Labs - Every other week:  ESR and CRP Method of administration: IV Push Method of administration may be changed at the discretion of home infusion pharmacist based upon assessment of the patient  and/or caregiver's ability to self-administer the medication ordered. 12/06/20  Yes Socorro, Ivan Anchors, PA-C  citalopram (CELEXA) 20 MG tablet Take 1 tablet (20 mg total) by mouth daily. 12/08/20  Yes Angiulli, Lavon Paganini, PA-C  diclofenac Sodium (VOLTAREN) 1 % GEL Apply 2 g topically 4 (four) times daily. To right knee 12/08/20  Yes Angiulli, Lavon Paganini, PA-C  hydrocerin (EUCERIN) CREA Apply 1 application topically 2 (two) times daily. To legs and back 12/08/20  Yes Angiulli, Lavon Paganini, PA-C  HYDROcodone-acetaminophen (NORCO/VICODIN) 5-325 MG tablet Take 1 tablet by mouth every 3 (three) hours as needed for severe pain. 12/08/20  Yes Angiulli, Lavon Paganini, PA-C  hydrocortisone cream 1 % Apply topically 2 (two) times daily. To back/areas of itching 12/08/20  Yes Angiulli, Lavon Paganini, PA-C  insulin aspart (NOVOLOG FLEXPEN) 100 UNIT/ML FlexPen Inject 2-13 Units into the skin 3 (three) times daily with meals. Sliding Scale insulin for blood sugars from 101-150 - 2 units. For blood sugars from 151-200 - 3 units. For blood sugars from 201-250 - 5 units. For blood sugars from 301-350 use 7 units. Call MD if blood sugars are over 300. 12/08/20  Yes Blandon, Ivan Anchors, PA-C  insulin detemir (LEVEMIR) 100 UNIT/ML FlexPen Inject 15 Units into the skin 2 (two) times daily. 12/08/20  Yes Granier, Ivan Anchors, PA-C  levETIRAcetam (KEPPRA) 1000 MG tablet Take 1 tablet (1,000 mg total) by mouth 2 (two) times daily. 12/08/20  Yes Angiulli, Lavon Paganini, PA-C  lidocaine (LIDODERM) 5 % Place 1 patch onto the skin daily. Apply to lower back at 8 pm and remove at 8 am daily 12/08/20  Yes Angiulli, Lavon Paganini, PA-C  lipase/protease/amylase 24000-76000 units CPEP Take 1 capsule (24,000 Units total) by mouth 3 (three) times daily before meals. 12/08/20  Yes Angiulli, Lavon Paganini, PA-C  loperamide (IMODIUM) 2 MG capsule Take 2 mg by mouth daily as needed for diarrhea or loose stools.   Yes [provider]  loratadine (CLARITIN) 10 MG tablet Take 1 tablet (10 mg  total) by mouth daily. 12/08/20  Yes Angiulli, Lavon Paganini, PA-C  metroNIDAZOLE (FLAGYL) 500 MG tablet Take 1 tablet (500 mg total) by mouth every 8 (eight) hours for 15 days. Treat through 12/21/20 12/08/20 12/23/20 Yes Angiulli, Lavon Paganini, PA-C  midodrine (PROAMATINE) 5 MG tablet Take 1 tablet (5 mg total) by mouth 3 (three) times daily with  meals. 12/08/20  Yes Angiulli, Lavon Paganini, PA-C  morphine (MS CONTIN) 30 MG 12 hr tablet Take 1 tablet (30 mg total) by mouth daily. 12/09/20  Yes Angiulli, Lavon Paganini, PA-C  Multiple Vitamin (MULTIVITAMIN WITH MINERALS) TABS tablet Take 1 tablet by mouth daily.   Yes [provider]  omeprazole (PRILOSEC) 20 MG capsule TAKE 1 CAPSULE DAILY Patient taking differently: Take 20 mg by mouth daily. 11/17/20  Yes [name], MD  ondansetron (ZOFRAN-ODT) 4 MG disintegrating tablet Take 1 tablet (4 mg total) by mouth every 6 (six) hours as needed for nausea or vomiting (can alternate with compazine). 12/08/20  Yes Angiulli, Lavon Paganini, PA-C  polyethylene glycol (MIRALAX / GLYCOLAX) 17 g packet Take 17 g by mouth daily as needed for mild constipation. 11/12/20  Yes Debbe Odea, MD  simethicone (MYLICON) 80 MG chewable tablet Chew 1 tablet (80 mg total) by mouth 4 (four) times daily -  with meals and at bedtime. 12/08/20  Yes Angiulli, Lavon Paganini, PA-C  tamsulosin (FLOMAX) 0.4 MG CAPS capsule Take 2 capsules (0.8 mg total) by mouth daily. 12/08/20  Yes Angiulli, Lavon Paganini, PA-C  zinc sulfate 220 (50 Zn) MG capsule Take 1 capsule (220 mg total) by mouth daily. 12/08/20  Yes Angiulli, Lavon Paganini, PA-C  Insulin Pen Needle (PEN NEEDLES) 32G X 6 MM MISC 1 application by Does not apply route as needed. 12/08/20   Blankenbaker, Ivan Anchors, PA-C  polycarbophil (FIBERCON) 625 MG tablet Take 1 tablet (625 mg total) by mouth 2 (two) times daily. 12/08/20   Angiulli, Lavon Paganini, PA-C  potassium chloride (KLOR-CON) 20 MEQ packet Take 40 mEq by mouth daily. 12/08/20   Angiulli, Lavon Paganini, PA-C    Physical  Exam:  Constitutional: Elderly male who appears chronically ill and lethargic but able to easily arouse and follow commands Vitals:   12/15/20 1245 12/15/20 1330 12/15/20 1345 12/15/20 1400  BP: 128/63 (!) 145/67 (!) 150/69 (!) 155/67  Pulse: 64 62 63 63  Resp: 14 15 18 18   Temp:      TempSrc:      SpO2: 99% 98% 99% 100%  Weight:      Height:       Eyes: PERRL, lids and conjunctivae normal ENMT: Mucous membranes are dry. Posterior pharynx clear of any exudate or lesions. Neck: normal, supple, no masses, no thyromegaly Respiratory: clear to auscultation bilaterally, no wheezing, no crackles. Normal respiratory effort. No accessory muscle use.  Cardiovascular: Regular rate and rhythm, no murmurs / rubs / gallops. No extremity edema. 2+ pedal pulses. No carotid bruits.  Right-sided PICC. Abdomen: no tenderness, no masses palpated. No hepatosplenomegaly. Bowel sounds positive.  Foley catheter in place draining dark urine. Musculoskeletal: no clubbing / cyanosis. No joint deformity upper and lower extremities. Good ROM, no contractures. Normal muscle tone.  Skin: no rashes, lesions, ulcers. No induration Neurologic: CN 2-12 grossly intact. Sensation intact, DTR normal. Strength 5/5 in all 4.  Psychiatric: Normal judgment and insight. Alert and oriented x 3. Normal mood.     Labs on Admission: I have personally reviewed following labs and imaging studies  CBC: Recent Labs  Lab 12/09/20 1656 12/13/20 2015 12/15/20 1155 12/15/20 1202  WBC 14.5* 13.5* 11.6*  --   NEUTROABS 12.2* 11.1* 10.1*  --   HGB 10.9* 9.7* 10.4* 11.6*  HCT 33.7* 30.4* 32.7* 34.0*  MCV 88.5 89.9 89.8  --   PLT 577* 361 374  --    Basic Metabolic Panel: Recent  Labs  Lab 12/09/20 1656 12/13/20 2015 12/15/20 1155 12/15/20 1202  NA 135 135 139 140  K 4.0 2.8* 2.6* 2.7*  CL 100 98 101 98  CO2 24 28 31   --   GLUCOSE 338* 183* 89 84  BUN 8 14 10 11   CREATININE 0.70 0.38* 0.47* 0.30*  CALCIUM 9.1 8.3* 8.3*   --    GFR: Estimated Creatinine Clearance: 95.4 mL/min (A) (by C-G formula based on SCr of 0.3 mg/dL (L)). Liver Function Tests: Recent Labs  Lab 12/09/20 1656 12/13/20 2015 12/15/20 1155  AST 86* 46* 21  ALT 50* 34 26  ALKPHOS 1,715* 1,538* 1,159*  BILITOT 0.3 0.4 0.7  PROT 8.3* 6.8 6.9  ALBUMIN 3.1* 2.8* 2.7*   No results for input(s): LIPASE, AMYLASE in the last 168 hours. No results for input(s): AMMONIA in the last 168 hours. Coagulation Profile: Recent Labs  Lab 12/15/20 1155  INR 1.2   Cardiac Enzymes: No results for input(s): CKTOTAL, CKMB, CKMBINDEX, TROPONINI in the last 168 hours. BNP (last 3 results) No results for input(s): PROBNP in the last 8760 hours. HbA1C: No results for input(s): HGBA1C in the last 72 hours. CBG: Recent Labs  Lab 12/13/20 1703 12/13/20 1749 12/15/20 1135 12/15/20 1250 12/15/20 1359  GLUCAP 61* 92 90 70 114*   Lipid Profile: No results for input(s): CHOL, HDL, LDLCALC, TRIG, CHOLHDL, LDLDIRECT in the last 72 hours. Thyroid Function Tests: No results for input(s): TSH, T4TOTAL, FREET4, T3FREE, THYROIDAB in the last 72 hours. Anemia Panel: No results for input(s): VITAMINB12, FOLATE, FERRITIN, TIBC, IRON, RETICCTPCT in the last 72 hours. Urine analysis:    Component Value Date/Time   COLORURINE YELLOW 12/09/2020 2057   APPEARANCEUR HAZY (A) 12/09/2020 2057   LABSPEC 1.027 12/09/2020 2057   PHURINE 5.0 12/09/2020 2057   GLUCOSEU >=500 (A) 12/09/2020 2057   HGBUR NEGATIVE 12/09/2020 2057   BILIRUBINUR NEGATIVE 12/09/2020 2057   BILIRUBINUR 1+ 02/23/2016 1256   KETONESUR 20 (A) 12/09/2020 2057   PROTEINUR 30 (A) 12/09/2020 2057   UROBILINOGEN 1.0 02/23/2016 1256   NITRITE NEGATIVE 12/09/2020 2057   LEUKOCYTESUR NEGATIVE 12/09/2020 2057   Sepsis Labs: Recent Results (from the past 240 hour(s))  Resp Panel by RT-PCR (Flu A&B, Covid) Nasopharyngeal Swab     Status: None   Collection Time: 12/10/20  9:30 AM   Specimen:  Nasopharyngeal Swab; Nasopharyngeal(NP) swabs in vial transport medium  Result Value Ref Range Status   SARS Coronavirus 2 by RT PCR NEGATIVE NEGATIVE Final    Comment: (NOTE) SARS-CoV-2 target nucleic acids are NOT DETECTED.  The SARS-CoV-2 RNA is generally detectable in upper respiratory specimens during the acute phase of infection. The lowest concentration of SARS-CoV-2 viral copies this assay can detect is 138 copies/mL. A negative result does not preclude SARS-Cov-2 infection and should not be used as the sole basis for treatment or other patient management decisions. A negative result may occur with  improper specimen collection/handling, submission of specimen other than nasopharyngeal swab, presence of viral mutation(s) within the areas targeted by this assay, and inadequate number of viral copies(<138 copies/mL). A negative result must be combined with clinical observations, patient history, and epidemiological information. The expected result is Negative.  Fact Sheet for Patients:  EntrepreneurPulse.com.au  Fact Sheet for Healthcare Providers:  IncredibleEmployment.be  This test is no t yet approved or cleared by the [place] FDA and  has been authorized for detection and/or diagnosis of SARS-CoV-2 by FDA under an Emergency Use Authorization (  EUA). This EUA will remain  in effect (meaning this test can be used) for the duration of the COVID-19 declaration under Section 564(b)(1) of the Act, 21 U.S.C.section 360bbb-3(b)(1), unless the authorization is terminated  or revoked sooner.       Influenza A by PCR NEGATIVE NEGATIVE Final   Influenza B by PCR NEGATIVE NEGATIVE Final    Comment: (NOTE) The Xpert Xpress SARS-CoV-2/FLU/RSV plus assay is intended as an aid in the diagnosis of influenza from Nasopharyngeal swab specimens and should not be used as a sole basis for treatment. Nasal washings and aspirates are unacceptable for  Xpert Xpress SARS-CoV-2/FLU/RSV testing.  Fact Sheet for Patients: EntrepreneurPulse.com.au  Fact Sheet for Healthcare Providers: IncredibleEmployment.be  This test is not yet approved or cleared by the [place] FDA and has been authorized for detection and/or diagnosis of SARS-CoV-2 by FDA under an Emergency Use Authorization (EUA). This EUA will remain in effect (meaning this test can be used) for the duration of the COVID-19 declaration under Section 564(b)(1) of the Act, 21 U.S.C. section 360bbb-3(b)(1), unless the authorization is terminated or revoked.  Performed at [facility], [place] [address] [place], [place] [address]   Resp Panel by RT-PCR (Flu A&B, Covid) Nasopharyngeal Swab     Status: None   Collection Time: 12/15/20 11:52 AM   Specimen: Nasopharyngeal Swab; Nasopharyngeal(NP) swabs in vial transport medium  Result Value Ref Range Status   SARS Coronavirus 2 by RT PCR NEGATIVE NEGATIVE Final    Comment: (NOTE) SARS-CoV-2 target nucleic acids are NOT DETECTED.  The SARS-CoV-2 RNA is generally detectable in upper respiratory specimens during the acute phase of infection. The lowest concentration of SARS-CoV-2 viral copies this assay can detect is 138 copies/mL. A negative result does not preclude SARS-Cov-2 infection and should not be used as the sole basis for treatment or other patient management decisions. A negative result may occur with  improper specimen collection/handling, submission of specimen other than nasopharyngeal swab, presence of viral mutation(s) within the areas targeted by this assay, and inadequate number of viral copies(<138 copies/mL). A negative result must be combined with clinical observations, patient history, and epidemiological information. The expected result is Negative.  Fact Sheet for Patients:  EntrepreneurPulse.com.au  Fact Sheet for Healthcare Providers:   IncredibleEmployment.be  This test is no t yet approved or cleared by the [place] FDA and  has been authorized for detection and/or diagnosis of SARS-CoV-2 by FDA under an Emergency Use Authorization (EUA). This EUA will remain  in effect (meaning this test can be used) for the duration of the COVID-19 declaration under Section 564(b)(1) of the Act, 21 U.S.C.section 360bbb-3(b)(1), unless the authorization is terminated  or revoked sooner.       Influenza A by PCR NEGATIVE NEGATIVE Final   Influenza B by PCR NEGATIVE NEGATIVE Final    Comment: (NOTE) The Xpert Xpress SARS-CoV-2/FLU/RSV plus assay is intended as an aid in the diagnosis of influenza from Nasopharyngeal swab specimens and should not be used as a sole basis for treatment. Nasal washings and aspirates are unacceptable for Xpert Xpress SARS-CoV-2/FLU/RSV testing.  Fact Sheet for Patients: EntrepreneurPulse.com.au  Fact Sheet for Healthcare Providers: IncredibleEmployment.be  This test is not yet approved or cleared by the [place] FDA and has been authorized for detection and/or diagnosis of SARS-CoV-2 by FDA under an Emergency Use Authorization (EUA). This EUA will remain in effect (meaning this test can be used) for the duration of the COVID-19 declaration under Section  564(b)(1) of the Act, 21 U.S.C. section 360bbb-3(b)(1), unless the authorization is terminated or revoked.  Performed at [facility], [place] [address] [place], [place] [address]      Radiological Exams on Admission: DG Chest 2 View  Result Date: 12/13/2020 CLINICAL DATA:  Weakness EXAM: CHEST - 2 VIEW COMPARISON:  October 09, 2020 FINDINGS: Right upper extremity PICC with tip overlying the superior cavoatrial junction. The heart size and mediastinal contours are within normal limits. Increased conspicuity of the they density overlying the right midlung. Left lung is clear. The  visualized skeletal structures are unremarkable. IMPRESSION: Increased conspicuity of they density overlying the right midlung, corresponding with the posterior loculated fluid collection. Electronically Signed   By: Dahlia Bailiff MD   On: 12/13/2020 21:07   CT Head Wo Contrast  Result Date: 12/13/2020 CLINICAL DATA:  Head trauma fall EXAM: CT HEAD WITHOUT CONTRAST TECHNIQUE: Contiguous axial images were obtained from the base of the skull through the vertex without intravenous contrast. COMPARISON:  CT brain 12/10/2020 FINDINGS: Brain: No acute territorial infarction, mass or new hemorrhage is visualized. Left hemispheric subdural hematoma without significant change since 12/10/2020, measures 3 mm maximum. No midline shift. Mild atrophy and chronic small vessel ischemic changes of the white matter. Vascular: No hyperdense vessels.  No unexpected calcification Skull: Left frontal burr hole.  No fracture Sinuses/Orbits: No acute finding. Other: None IMPRESSION: 1. No significant change in size or appearance of thin left convexity subdural hematoma without significant mass effect or midline shift. No acute interval change since 12/10/2020. 2. Atrophy and chronic small vessel ischemic change of the white matter Electronically Signed   By: Donavan Foil M.D.   On: 12/13/2020 22:05   DG Chest Port 1 View  Result Date: 12/15/2020 CLINICAL DATA:  sob EXAM: PORTABLE CHEST 1 VIEW COMPARISON:  Radiograph 12/13/2020, CT 12/10/2020 FINDINGS: Unchanged cardiomediastinal silhouette. Unchanged right upper extremity PICC. Unchanged opacity overlying the right mid lung corresponding to the known posterior loculated pleural collection. There is no new airspace disease. Bones are unchanged. IMPRESSION: Unchanged right midlung opacity corresponding to known right pleural collection. No new airspace disease. Electronically Signed   By: Maurine Simmering   On: 12/15/2020 12:52   DG Knee Complete 4 Views Right  Result Date:  12/13/2020 CLINICAL DATA:  Fall with right knee swelling EXAM: RIGHT KNEE - COMPLETE 4+ VIEW COMPARISON:  October 19, 2020 FINDINGS: Partially visualized femoral intramedullary rod and screw fixation without evidence of hardware loosening or perihardware fracture within the visualized portions. No evidence of acute fracture or dislocation. Small joint effusion. Severe tricompartment degenerative change with chondrocalcinosis similar prior. Deformity of the proximal right fibula consistent with remote healed fracture. IMPRESSION: 1. No acute fracture or dislocation. 2. Small joint effusion. 3. Severe tricompartment degenerative change with chondrocalcinosis. Electronically Signed   By: Dahlia Bailiff MD   On: 12/13/2020 21:01    EKG: independently reviewed.  Sinus arrhythmia 83  Assessment/Plan Diabetes mellitus type 2 with hypoglycemia: Patient presents after being found to be unresponsive with blood sugars initially noted to be around 41.  Patient had taken Levemir 20 units yesterday evening and had not eaten dinner.  Home regimen appears to Levemir 15 units twice daily and NovoLog sliding scale 2-13 units depending on blood sugars. -Hypoglycemic protocol -CBGs every 4 hours -Held home insulin regimen -Continue D10 IV fluids at 75 mL/h -Diabetic education consult  Hypokalemia: Acute.  Initial potassium noted to be as low as 2.6.  She  was given 10 mEq of potassium chloride IV. -Given additional 20 mEq of potassium chloride IV and 60 mEq p.o. -Check magnesium level -Continue to monitor and replace as needed   Leukocytosis: WBC elevated 11.6 which appears to be trending down from recent checks.  Recent CT abdomen pelvis from 5/13 revealed concern for subtle changes around the right kidney concerning for early pyelonephritis. -Check repeat urinalysis -Recheck CBC tomorrow a.m.  Liver abscessesempyema: Chronic.  Patient noted to have liver abscesses status post JP drain x2 and empyema of the right  middle lobe documented during previous hospitalization.  Patient had been recommended to continue on metronidazole and Rocephin through 6/24. -Continue Rocephin and metronidazole -Will need to discuss case with ID in a.m.  Adenocarcinoma the head of the pancreas: Patien with prior history of Whipple procedure s/p chemo and radiation.  Current recurrence of pancreatic cancer thought to be nonresectable.  Radiation was planned but patient had not been able to start due to history of hip fracture. -Continue Creon replacement -Follow-up with radiation oncology when able  Essential hypertension: Blood pressures currently within normal limits.  Patient was discontinued off of blood pressure medications during last hospitalization and had been placed on midodrine. -Continue midodrine  Seizure disorder: During prior hospitalization EEG from 4/12 was suggestive of cortical dysfunction arising from the temporal lobe although no seizure or definitive epileptiform discharges were appreciated. -Continue Keppra  Subdural hematoma: Patient status post bur hole on 4/12.  During hospitalization there was question of the possibility of an infection of the bur holes.   Mixed hyperlipidemia -Continue statin  BPH with chronic indwelling Foley -Follow-up urinalysis -Continue Flomax -Orders placed to change out Foley catheter  Recent right hip fracture s/p repair by intramedullary nail in 09/2020 -PT/OT  GERD without esophagitis -Continue PPI  DVT prophylaxis: Lovenox Code Status: Full Family Communication: Wife updated at bedside Disposition Plan: To be determined Consults called: None Admission status: Observation, patient possibly to require less than 24-hour hospital stay  Norval Morton MD [facility]   If 7PM-7AM, please contact night-coverage   12/15/2020, 3:48 PM

## 2020-12-15 NOTE — Telephone Encounter (Signed)
R/s appts per 5/18 sch msg. Called pt, no answer. Left msg with appts date and times.  

## 2020-12-15 NOTE — ED Provider Notes (Signed)
[facility] EMERGENCY DEPARTMENT Provider Note   CSN: [id number] Arrival date & time: 12/15/20  1134     History Chief Complaint  Patient presents with  . Hypoglycemia    GAVEN EUGENE is a 66 y.o. male.  65 year old male with prior medical history as detailed below presents for evaluation.  Patient with hypoglycemia.  Patient with recent discharge on May 11.  Patient is on insulin.  Patient apparently administered insulin last night.  Patient was hypoglycemic last night.  EMS did evaluate him in the home.  He refused transport.  This morning he was difficult to arouse per family.  EMS was again called to the house.  Patient was found to be hypoglycemic and poorly responsive.  Patient was given dextrose in route by EMS.  Upon arrival to the ED the patient reports that he feels weak.  The history is provided by the patient and medical records.  Hypoglycemia Severity:  Moderate Onset quality:  Gradual Duration:  1 day Timing:  Intermittent Progression:  Waxing and waning Chronicity:  Recurrent Diabetic status:  Controlled with insulin      Past Medical History:  Diagnosis Date  . Arthritis    left hand  . Bronchitis 1977  . Cancer ([place]) 03/09/2016   pancreatic cancer, sees Dr. Cristino Martes at [facility]   . Depression    takes Cymbalta daily  . Diabetes mellitus type II    sees Dr. Chalmers Cater   . GERD (gastroesophageal reflux disease)    takes Omeprazole daily  . H/O hiatal hernia   . Hyperlipidemia    takes Zocor daily  . Hypertension    takes Amlodipine daily  . Hypoglycemia 06/18/2017  . Neck pain    C4-7 stenosis and herniated disc  . Neuromuscular disorder ([place])    hiatal hernia  . Scoliosis    slight  . Spinal cord injury, C5-C7 ([place])    c4-c7  . Stiffness of hand joint    d/t cervical issues    Patient Active Problem List   Diagnosis Date Noted  . Anemia of chronic illness 12/09/2020  . Hypokalemia 12/09/2020  . Nausea & vomiting   .  Pain   . Bladder outlet obstruction 11/12/2020  . Toxic encephalopathy 11/12/2020  . Atrophic pancreas 11/12/2020  . Subdural hematoma ([place])   . Pneumonia of both lower lobes due to infectious organism 11/06/2020  . Liver abscess 11/06/2020  . Sepsis ([place]) 11/05/2020  . Uncontrolled type 2 diabetes mellitus with ketoacidosis without coma, with long-term current use of insulin ([place]) 11/05/2020  . Increased anion gap metabolic acidosis 62/37/6283  . Chronic diastolic CHF (congestive heart failure) ([place]) 11/05/2020  . Closed right hip fracture, initial encounter ([place]) 10/18/2020  . Fall from ground level 10/18/2020  . Hypoglycemia 06/19/2017  . Hypothermia 06/19/2017  . Goals of care, counseling/discussion 09/29/2016  . Port catheter in place 04/11/2016  . Hypercalcemia 03/29/2016  . Adenocarcinoma of head of pancreas ([place]) 03/17/2016  . Biliary obstruction   . Obstructive jaundice due to malignant neoplasm ([place]) 02/27/2016  . DKA (diabetic ketoacidosis) ([place]) 02/27/2016  . Mixed diabetic hyperlipidemia associated with type 2 diabetes mellitus ([place]) 05/05/2014  . Cervical spondylosis with myelopathy 08/18/2011  . CERUMEN IMPACTION 12/14/2008  . Diabetes ([place]) 09/16/2007  . Hyperlipidemia, mixed 09/16/2007  . Essential hypertension 09/16/2007  . GERD without esophagitis 09/16/2007  . ESOPHAGEAL STRICTURE 04/04/2007  . HIATAL HERNIA 04/04/2007    Past Surgical History:  Procedure Laterality Date  .  ANTERIOR CERVICAL DECOMP/DISCECTOMY FUSION  08/18/2011   Procedure: ANTERIOR CERVICAL DECOMPRESSION/DISCECTOMY FUSION 3 LEVELS;  Surgeon: Winfield Cunas, MD;  Location: [facility];  Service: Neurosurgery;  Laterality: N/A;  Anterior Cervical Four-Five/Five-Six/Six-Seven Decompression with Fusion, Plating, and Bonegraft  . BURR HOLE Left 11/09/2020   Procedure: LEFT BURR HOLES FOR EVACUATION OF Subdural Hematoma;  Surgeon: Judith Part, MD;  Location: [facility];  Service: Neurosurgery;   Laterality: Left;  . CARPAL TUNNEL RELEASE  2013   bilateral, per Dr. Christella Noa   . COLONOSCOPY  10-30-14   per Dr. Olevia Perches, clear, repeat in 10 yrs   . egd with esophageal dilation  9-08   per Dr. Olevia Perches  . ERCP N/A 03/01/2016   Procedure: ENDOSCOPIC RETROGRADE CHOLANGIOPANCREATOGRAPHY (ERCP) with brushings and stent;  Surgeon: Doran Stabler, MD;  Location: [facility];  Service: Endoscopy;  Laterality: N/A;  . ESOPHAGOGASTRODUODENOSCOPY (EGD) WITH PROPOFOL N/A 05/27/2020   Procedure: ESOPHAGOGASTRODUODENOSCOPY (EGD) WITH PROPOFOL;  Surgeon: Milus Banister, MD;  Location: [facility];  Service: Endoscopy;  Laterality: N/A;  . EUS N/A 03/09/2016   Procedure: ESOPHAGEAL ENDOSCOPIC ULTRASOUND (EUS) RADIAL;  Surgeon: Milus Banister, MD;  Location: [facility];  Service: Endoscopy;  Laterality: N/A;  . EUS N/A 05/27/2020   Procedure: UPPER ENDOSCOPIC ULTRASOUND (EUS) RADIAL;  Surgeon: Milus Banister, MD;  Location: [facility];  Service: Endoscopy;  Laterality: N/A;  . FEMUR IM NAIL Right 10/25/2020   Procedure: INTRAMEDULLARY (IM) NAIL FEMORAL;  Surgeon: Rod Can, MD;  Location: [facility];  Service: Orthopedics;  Laterality: Right;  . IR THORACENTESIS ASP PLEURAL SPACE W/IMG GUIDE  11/19/2020  . lymph nodes biopsy    . melanoma rt calf  1999  . PORT-A-CATH REMOVAL N/A 04/03/2019   Procedure: PORT REMOVAL;  Surgeon: Stark Klein, MD;  Location: [facility];  Service: General;  Laterality: N/A;  . PORTACATH PLACEMENT Left 03/22/2016   Procedure: INSERTION PORT-A-CATH;  Surgeon: Stark Klein, MD;  Location: [facility];  Service: General;  Laterality: Left;  . SPINE SURGERY    . TONSILLECTOMY     as a child  . ULNAR TUNNEL RELEASE  2013   right arm, per Dr. Christella Noa   . UPPER GASTROINTESTINAL ENDOSCOPY    . WHIPPLE PROCEDURE N/A 09/19/2016   Procedure: DIAGNOSTIC LAPAROSCOPY, LAPAROSCOPIC LIVER BIOPSY, RETROPERITONEAL EXPLORATION, INTRAOPERATIVE ULTRASOUND;  Surgeon: Stark Klein, MD;  Location: [facility];  Service: General;  Laterality: N/A;       Family History  Problem Relation Age of Onset  . Heart disease Father   . Heart disease Brother 76  . Anesthesia problems Mother   . Heart disease Mother   . Dementia Mother   . Diabetes Sister   . Stroke Sister   . Colon cancer Neg Hx   . Rectal cancer Neg Hx   . Stomach cancer Neg Hx     Social History   Tobacco Use  . Smoking status: Never Smoker  . Smokeless tobacco: Never Used  . Tobacco comment: tried a pipe 35 years ago   Vaping Use  . Vaping Use: Never used  Substance Use Topics  . Alcohol use: No    Alcohol/week: 0.0 standard drinks  . Drug use: No    Home Medications Prior to Admission medications   Medication Sig Start Date End Date Taking? Authorizing Provider  acetaminophen (TYLENOL) 325 MG tablet Take 1-2 tablets (325-650 mg total) by mouth every 4 (four) hours as needed for mild pain. 12/08/20  Angiulli, Lavon Paganini, PA-C  ascorbic acid (VITAMIN C) 500 MG tablet Take 1 tablet (500 mg total) by mouth daily. 12/08/20   Angiulli, Lavon Paganini, PA-C  cefTRIAXone (ROCEPHIN) IVPB Inject 2 g into the vein every 12 (twelve) hours. Indication:  liver abscess and possible infected subdural hematoma First Dose: No Last Day of Therapy:  12/21/20 Labs - Once weekly:  CBC/D and BMP, Labs - Every other week:  ESR and CRP Method of administration: IV Push Method of administration may be changed at the discretion of home infusion pharmacist based upon assessment of the patient and/or caregiver's ability to self-administer the medication ordered. 12/06/20   Billy, Ivan Anchors, PA-C  citalopram (CELEXA) 20 MG tablet Take 1 tablet (20 mg total) by mouth daily. 12/08/20   Angiulli, Lavon Paganini, PA-C  diclofenac Sodium (VOLTAREN) 1 % GEL Apply 2 g topically 4 (four) times daily. To right knee 12/08/20   Angiulli, Lavon Paganini, PA-C  hydrocerin (EUCERIN) CREA Apply 1 application topically 2 (two) times daily. To legs and back 12/08/20   Angiulli, Lavon Paganini,  PA-C  HYDROcodone-acetaminophen (NORCO/VICODIN) 5-325 MG tablet Take 1 tablet by mouth every 3 (three) hours as needed for severe pain. 12/08/20   Angiulli, Lavon Paganini, PA-C  hydrocortisone cream 1 % Apply topically 2 (two) times daily. To back/areas of itching 12/08/20   Angiulli, Lavon Paganini, PA-C  insulin aspart (NOVOLOG FLEXPEN) 100 UNIT/ML FlexPen Inject 2-13 Units into the skin 3 (three) times daily with meals. Sliding Scale insulin for blood sugars from 101-150 - 2 units. For blood sugars from 151-200 - 3 units. For blood sugars from 201-250 - 5 units. For blood sugars from 301-350 use 7 units. Call MD if blood sugars are over 300. 12/08/20   P[place]ar, Ivan Anchors, PA-C  insulin detemir (LEVEMIR) 100 UNIT/ML FlexPen Inject 15 Units into the skin 2 (two) times daily. 12/08/20   [place], Ivan Anchors, PA-C  Insulin Pen Needle (PEN NEEDLES) 32G X 6 MM MISC 1 application by Does not apply route as needed. 12/08/20   Szczesny, Ivan Anchors, PA-C  levETIRAcetam (KEPPRA) 1000 MG tablet Take 1 tablet (1,000 mg total) by mouth 2 (two) times daily. 12/08/20   Angiulli, Lavon Paganini, PA-C  lidocaine (LIDODERM) 5 % Place 1 patch onto the skin daily. Apply to lower back at 8 pm and remove at 8 am daily 12/08/20   Angiulli, Lavon Paganini, PA-C  lipase/protease/amylase 24000-76000 units CPEP Take 1 capsule (24,000 Units total) by mouth 3 (three) times daily before meals. 12/08/20   Angiulli, Lavon Paganini, PA-C  loratadine (CLARITIN) 10 MG tablet Take 1 tablet (10 mg total) by mouth daily. 12/08/20   Angiulli, Lavon Paganini, PA-C  metroNIDAZOLE (FLAGYL) 500 MG tablet Take 1 tablet (500 mg total) by mouth every 8 (eight) hours for 15 days. Treat through 12/21/20 12/08/20 12/23/20  Cathlyn Parsons, PA-C  midodrine (PROAMATINE) 5 MG tablet Take 1 tablet (5 mg total) by mouth 3 (three) times daily with meals. 12/08/20   Angiulli, Lavon Paganini, PA-C  morphine (MS CONTIN) 30 MG 12 hr tablet Take 1 tablet (30 mg total) by mouth daily. 12/09/20   Angiulli, Lavon Paganini, PA-C   Multiple Vitamin (MULTIVITAMIN WITH MINERALS) TABS tablet Take 1 tablet by mouth daily.    [provider]  omeprazole (PRILOSEC) 20 MG capsule TAKE 1 CAPSULE DAILY 11/17/20   [name], MD  ondansetron (ZOFRAN-ODT) 4 MG disintegrating tablet Take 1 tablet (4 mg total) by mouth every 6 (  six) hours as needed for nausea or vomiting (can alternate with compazine). 12/08/20   Angiulli, Lavon Paganini, PA-C  polycarbophil (FIBERCON) 625 MG tablet Take 1 tablet (625 mg total) by mouth 2 (two) times daily. 12/08/20   Angiulli, Lavon Paganini, PA-C  polyethylene glycol (MIRALAX / GLYCOLAX) 17 g packet Take 17 g by mouth daily as needed for mild constipation. 11/12/20   Debbe Odea, MD  potassium chloride (KLOR-CON) 20 MEQ packet Take 40 mEq by mouth daily. 12/08/20   Angiulli, Lavon Paganini, PA-C  simethicone (MYLICON) 80 MG chewable tablet Chew 1 tablet (80 mg total) by mouth 4 (four) times daily -  with meals and at bedtime. 12/08/20   Angiulli, Lavon Paganini, PA-C  tamsulosin (FLOMAX) 0.4 MG CAPS capsule Take 2 capsules (0.8 mg total) by mouth daily. 12/08/20   Angiulli, Lavon Paganini, PA-C  zinc sulfate 220 (50 Zn) MG capsule Take 1 capsule (220 mg total) by mouth daily. 12/08/20   Angiulli, Lavon Paganini, PA-C    Allergies    Patient has no known allergies.  Review of Systems   Review of Systems  All other systems reviewed and are negative.   Physical Exam Updated Vital Signs BP 112/61   Pulse 77   Temp 97.6 F (36.4 C) (Oral)   Resp 19   Ht 5' 11"  (1.803 m)   Wt 86.2 kg   SpO2 96%   BMI 26.50 kg/m   Physical Exam Vitals and nursing note reviewed.  Constitutional:      General: He is not in acute distress.    Appearance: He is well-developed.     Comments: Chronically ill in appearance   HENT:     Head: Normocephalic and atraumatic.  Eyes:     Conjunctiva/sclera: Conjunctivae normal.     Pupils: Pupils are equal, round, and reactive to light.  Cardiovascular:     Rate and Rhythm: Normal rate and  regular rhythm.     Heart sounds: Normal heart sounds.  Pulmonary:     Effort: Pulmonary effort is normal. No respiratory distress.     Breath sounds: Normal breath sounds.  Abdominal:     General: There is no distension.     Palpations: Abdomen is soft.     Tenderness: There is no abdominal tenderness.  Musculoskeletal:        General: No deformity. Normal range of motion.     Cervical back: Normal range of motion and neck supple.  Skin:    General: Skin is warm and dry.  Neurological:     Mental Status: He is alert and oriented to person, place, and time.     ED Results / Procedures / Treatments   Labs (all labs ordered are listed, but only abnormal results are displayed) Labs Reviewed  I-STAT CHEM 8, ED - Abnormal; Notable for the following components:      Result Value   Potassium 2.7 (*)    Creatinine, Ser 0.30 (*)    Calcium, Ion 1.11 (*)    Hemoglobin 11.6 (*)    HCT 34.0 (*)    All other components within normal limits  CULTURE, BLOOD (ROUTINE X 2)  CULTURE, BLOOD (ROUTINE X 2)  RESP PANEL BY RT-PCR (FLU A&B, COVID) ARPGX2  URINALYSIS, ROUTINE W REFLEX MICROSCOPIC  COMPREHENSIVE METABOLIC PANEL  LACTIC ACID, PLASMA  LACTIC ACID, PLASMA  CBC WITH DIFFERENTIAL/PLATELET  PROTIME-INR  CBG MONITORING, ED  CBG MONITORING, ED  TROPONIN I (HIGH SENSITIVITY)    EKG EKG Interpretation  Date/Time:  Wednesday Dec 15 2020 11:41:58 EDT Ventricular Rate:  83 PR Interval:    QRS Duration: 86 QT Interval:  381 QTC Calculation: 448 R Axis:   77 Text Interpretation: Atrial flutter with predominant 3:1 AV block Borderline repolarization abnormality Confirmed by Dene Gentry [phone]) on 12/15/2020 11:42:59 AM   Radiology DG Chest 2 View  Result Date: 12/13/2020 CLINICAL DATA:  Weakness EXAM: CHEST - 2 VIEW COMPARISON:  October 09, 2020 FINDINGS: Right upper extremity PICC with tip overlying the superior cavoatrial junction. The heart size and mediastinal contours are  within normal limits. Increased conspicuity of the they density overlying the right midlung. Left lung is clear. The visualized skeletal structures are unremarkable. IMPRESSION: Increased conspicuity of they density overlying the right midlung, corresponding with the posterior loculated fluid collection. Electronically Signed   By: Dahlia Bailiff MD   On: 12/13/2020 21:07   CT Head Wo Contrast  Result Date: 12/13/2020 CLINICAL DATA:  Head trauma fall EXAM: CT HEAD WITHOUT CONTRAST TECHNIQUE: Contiguous axial images were obtained from the base of the skull through the vertex without intravenous contrast. COMPARISON:  CT brain 12/10/2020 FINDINGS: Brain: No acute territorial infarction, mass or new hemorrhage is visualized. Left hemispheric subdural hematoma without significant change since 12/10/2020, measures 3 mm maximum. No midline shift. Mild atrophy and chronic small vessel ischemic changes of the white matter. Vascular: No hyperdense vessels.  No unexpected calcification Skull: Left frontal burr hole.  No fracture Sinuses/Orbits: No acute finding. Other: None IMPRESSION: 1. No significant change in size or appearance of thin left convexity subdural hematoma without significant mass effect or midline shift. No acute interval change since 12/10/2020. 2. Atrophy and chronic small vessel ischemic change of the white matter Electronically Signed   By: Donavan Foil M.D.   On: 12/13/2020 22:05   DG Knee Complete 4 Views Right  Result Date: 12/13/2020 CLINICAL DATA:  Fall with right knee swelling EXAM: RIGHT KNEE - COMPLETE 4+ VIEW COMPARISON:  October 19, 2020 FINDINGS: Partially visualized femoral intramedullary rod and screw fixation without evidence of hardware loosening or perihardware fracture within the visualized portions. No evidence of acute fracture or dislocation. Small joint effusion. Severe tricompartment degenerative change with chondrocalcinosis similar prior. Deformity of the proximal right  fibula consistent with remote healed fracture. IMPRESSION: 1. No acute fracture or dislocation. 2. Small joint effusion. 3. Severe tricompartment degenerative change with chondrocalcinosis. Electronically Signed   By: Dahlia Bailiff MD   On: 12/13/2020 21:01    Procedures Procedures   Medications Ordered in ED Medications  dextrose 10 % infusion ( Intravenous New Bag/Given 12/15/20 1151)    ED Course  I have reviewed the triage vital signs and the nursing notes.  Pertinent labs & imaging results that were available during my care of the patient were reviewed by me and considered in my medical decision making (see chart for details).    MDM Rules/Calculators/A&P                          MDM  MSE complete  KELL FERRIS was evaluated in Emergency Department on 12/15/2020 for the symptoms described in the history of present illness. He was evaluated in the context of the global COVID-19 pandemic, which necessitated consideration that the patient might be at risk for infection with the SARS-CoV-2 virus that causes COVID-19. Institutional protocols and algorithms that pertain to the evaluation of patients at risk for COVID-19 are in a  state of rapid change based on information released by regulatory bodies including the CDC and federal and state organizations. These policies and algorithms were followed during the patient's care in the ED.  Patient is presenting for persistent hypoglycemia.  Initial blood glucose in the field was 42.  This was treated with D10 by EMS.  Patient's CBG was 125 just prior to arrival.  Patient was started on D10 drip at a rate of 75 upon arrival to the ED.  Despite D10 infusion, patient's CBG continued to drop.  Rate of infusion was increased to 125/h.  Patient's last reported use of insulin was yesterday.  He reports that he has not eaten any food today.  Patient also found to be hypokalemic. Treatment initiated in ED.   Patient would benefit from admission.   Hospitalist team is aware of case.   Final Clinical Impression(s) / ED Diagnoses Final diagnoses:  Hypoglycemia  Hypokalemia    Rx / DC Orders ED Discharge Orders    None       Valarie Merino, MD 12/15/20 1416

## 2020-12-15 NOTE — Plan of Care (Signed)

## 2020-12-15 NOTE — ED Notes (Signed)
Attempted to call report

## 2020-12-15 NOTE — ED Notes (Signed)
Pt is unable to sign MSE waiver at this time. No family at bedside to assist or sign for pt

## 2020-12-16 ENCOUNTER — Ambulatory Visit: Payer: Medicare Other | Admitting: Radiation Oncology

## 2020-12-16 ENCOUNTER — Observation Stay (HOSPITAL_COMMUNITY): Payer: Medicare Other

## 2020-12-16 DIAGNOSIS — L89616 Pressure-induced deep tissue damage of right heel: Secondary | ICD-10-CM | POA: Diagnosis present

## 2020-12-16 DIAGNOSIS — I10 Essential (primary) hypertension: Secondary | ICD-10-CM | POA: Diagnosis present

## 2020-12-16 DIAGNOSIS — Z209 Contact with and (suspected) exposure to unspecified communicable disease: Secondary | ICD-10-CM | POA: Diagnosis not present

## 2020-12-16 DIAGNOSIS — B952 Enterococcus as the cause of diseases classified elsewhere: Secondary | ICD-10-CM | POA: Diagnosis present

## 2020-12-16 DIAGNOSIS — E162 Hypoglycemia, unspecified: Secondary | ICD-10-CM | POA: Diagnosis not present

## 2020-12-16 DIAGNOSIS — F32A Depression, unspecified: Secondary | ICD-10-CM | POA: Diagnosis present

## 2020-12-16 DIAGNOSIS — Z90411 Acquired partial absence of pancreas: Secondary | ICD-10-CM | POA: Diagnosis not present

## 2020-12-16 DIAGNOSIS — S065X9D Traumatic subdural hemorrhage with loss of consciousness of unspecified duration, subsequent encounter: Secondary | ICD-10-CM | POA: Diagnosis not present

## 2020-12-16 DIAGNOSIS — R001 Bradycardia, unspecified: Secondary | ICD-10-CM | POA: Diagnosis present

## 2020-12-16 DIAGNOSIS — E119 Type 2 diabetes mellitus without complications: Secondary | ICD-10-CM | POA: Diagnosis not present

## 2020-12-16 DIAGNOSIS — B954 Other streptococcus as the cause of diseases classified elsewhere: Secondary | ICD-10-CM | POA: Diagnosis present

## 2020-12-16 DIAGNOSIS — N320 Bladder-neck obstruction: Secondary | ICD-10-CM | POA: Diagnosis present

## 2020-12-16 DIAGNOSIS — Z1621 Resistance to vancomycin: Secondary | ICD-10-CM | POA: Diagnosis present

## 2020-12-16 DIAGNOSIS — E11649 Type 2 diabetes mellitus with hypoglycemia without coma: Secondary | ICD-10-CM | POA: Diagnosis present

## 2020-12-16 DIAGNOSIS — M6281 Muscle weakness (generalized): Secondary | ICD-10-CM | POA: Diagnosis not present

## 2020-12-16 DIAGNOSIS — D638 Anemia in other chronic diseases classified elsewhere: Secondary | ICD-10-CM | POA: Diagnosis not present

## 2020-12-16 DIAGNOSIS — J984 Other disorders of lung: Secondary | ICD-10-CM | POA: Diagnosis not present

## 2020-12-16 DIAGNOSIS — F339 Major depressive disorder, recurrent, unspecified: Secondary | ICD-10-CM | POA: Diagnosis not present

## 2020-12-16 DIAGNOSIS — E782 Mixed hyperlipidemia: Secondary | ICD-10-CM | POA: Diagnosis present

## 2020-12-16 DIAGNOSIS — G4089 Other seizures: Secondary | ICD-10-CM | POA: Diagnosis not present

## 2020-12-16 DIAGNOSIS — C250 Malignant neoplasm of head of pancreas: Secondary | ICD-10-CM | POA: Diagnosis present

## 2020-12-16 DIAGNOSIS — E1165 Type 2 diabetes mellitus with hyperglycemia: Secondary | ICD-10-CM | POA: Diagnosis present

## 2020-12-16 DIAGNOSIS — I951 Orthostatic hypotension: Secondary | ICD-10-CM | POA: Diagnosis present

## 2020-12-16 DIAGNOSIS — Z20822 Contact with and (suspected) exposure to covid-19: Secondary | ICD-10-CM | POA: Diagnosis present

## 2020-12-16 DIAGNOSIS — N401 Enlarged prostate with lower urinary tract symptoms: Secondary | ICD-10-CM | POA: Diagnosis not present

## 2020-12-16 DIAGNOSIS — F419 Anxiety disorder, unspecified: Secondary | ICD-10-CM | POA: Diagnosis present

## 2020-12-16 DIAGNOSIS — R6889 Other general symptoms and signs: Secondary | ICD-10-CM | POA: Diagnosis not present

## 2020-12-16 DIAGNOSIS — I5032 Chronic diastolic (congestive) heart failure: Secondary | ICD-10-CM | POA: Diagnosis not present

## 2020-12-16 DIAGNOSIS — G40909 Epilepsy, unspecified, not intractable, without status epilepticus: Secondary | ICD-10-CM | POA: Diagnosis present

## 2020-12-16 DIAGNOSIS — M1711 Unilateral primary osteoarthritis, right knee: Secondary | ICD-10-CM | POA: Diagnosis not present

## 2020-12-16 DIAGNOSIS — Z79899 Other long term (current) drug therapy: Secondary | ICD-10-CM | POA: Diagnosis not present

## 2020-12-16 DIAGNOSIS — Z743 Need for continuous supervision: Secondary | ICD-10-CM | POA: Diagnosis not present

## 2020-12-16 DIAGNOSIS — K750 Abscess of liver: Secondary | ICD-10-CM | POA: Diagnosis present

## 2020-12-16 DIAGNOSIS — J929 Pleural plaque without asbestos: Secondary | ICD-10-CM | POA: Diagnosis not present

## 2020-12-16 DIAGNOSIS — R279 Unspecified lack of coordination: Secondary | ICD-10-CM | POA: Diagnosis not present

## 2020-12-16 DIAGNOSIS — J869 Pyothorax without fistula: Secondary | ICD-10-CM | POA: Diagnosis present

## 2020-12-16 DIAGNOSIS — J9811 Atelectasis: Secondary | ICD-10-CM | POA: Diagnosis not present

## 2020-12-16 DIAGNOSIS — E876 Hypokalemia: Secondary | ICD-10-CM | POA: Diagnosis present

## 2020-12-16 DIAGNOSIS — Z923 Personal history of irradiation: Secondary | ICD-10-CM | POA: Diagnosis not present

## 2020-12-16 DIAGNOSIS — R5381 Other malaise: Secondary | ICD-10-CM | POA: Diagnosis not present

## 2020-12-16 DIAGNOSIS — K219 Gastro-esophageal reflux disease without esophagitis: Secondary | ICD-10-CM | POA: Diagnosis present

## 2020-12-16 DIAGNOSIS — I469 Cardiac arrest, cause unspecified: Secondary | ICD-10-CM | POA: Diagnosis not present

## 2020-12-16 DIAGNOSIS — Z452 Encounter for adjustment and management of vascular access device: Secondary | ICD-10-CM | POA: Diagnosis not present

## 2020-12-16 DIAGNOSIS — N400 Enlarged prostate without lower urinary tract symptoms: Secondary | ICD-10-CM | POA: Diagnosis present

## 2020-12-16 LAB — URINALYSIS, ROUTINE W REFLEX MICROSCOPIC
Bilirubin Urine: NEGATIVE
Glucose, UA: >=500 mg/dL — AB
Hgb urine dipstick: NEGATIVE
Ketones, ur: NEGATIVE mg/dL
Nitrite: NEGATIVE
Protein, ur: NEGATIVE mg/dL
Specific Gravity, Urine: 1.032 — ABNORMAL HIGH (ref 1.005–1.030)
pH: 6 (ref 5.0–8.0)

## 2020-12-16 LAB — GLUCOSE, CAPILLARY
Glucose-Capillary: 196 mg/dL — ABNORMAL HIGH (ref 70–99)
Glucose-Capillary: 203 mg/dL — ABNORMAL HIGH (ref 70–99)
Glucose-Capillary: 222 mg/dL — ABNORMAL HIGH (ref 70–99)
Glucose-Capillary: 261 mg/dL — ABNORMAL HIGH (ref 70–99)
Glucose-Capillary: 299 mg/dL — ABNORMAL HIGH (ref 70–99)
Glucose-Capillary: 374 mg/dL — ABNORMAL HIGH (ref 70–99)

## 2020-12-16 LAB — CBC
HCT: 30.4 % — ABNORMAL LOW (ref 39.0–52.0)
Hemoglobin: 9.8 g/dL — ABNORMAL LOW (ref 13.0–17.0)
MCH: 28.7 pg (ref 26.0–34.0)
MCHC: 32.2 g/dL (ref 30.0–36.0)
MCV: 89.1 fL (ref 80.0–100.0)
Platelets: 342 10*3/uL (ref 150–400)
RBC: 3.41 MIL/uL — ABNORMAL LOW (ref 4.22–5.81)
RDW: 18.6 % — ABNORMAL HIGH (ref 11.5–15.5)
WBC: 14.7 10*3/uL — ABNORMAL HIGH (ref 4.0–10.5)
nRBC: 0 % (ref 0.0–0.2)

## 2020-12-16 LAB — BASIC METABOLIC PANEL
Anion gap: 8 (ref 5–15)
BUN: 7 mg/dL — ABNORMAL LOW (ref 8–23)
CO2: 27 mmol/L (ref 22–32)
Calcium: 8.4 mg/dL — ABNORMAL LOW (ref 8.9–10.3)
Chloride: 100 mmol/L (ref 98–111)
Creatinine, Ser: 0.59 mg/dL — ABNORMAL LOW (ref 0.61–1.24)
GFR, Estimated: >60 mL/min (ref >60)
Glucose, Bld: 286 mg/dL — ABNORMAL HIGH (ref 70–99)
Potassium: 3.5 mmol/L (ref 3.5–5.1)
Sodium: 135 mmol/L (ref 135–145)

## 2020-12-16 MED ORDER — INSULIN DETEMIR 100 UNIT/ML ~~LOC~~ SOLN
20.0000 [IU] | Freq: Every day | SUBCUTANEOUS | Status: DC
  Administered 2020-12-16: 20 [IU] via SUBCUTANEOUS
  Filled 2020-12-16 (×2): qty 0.2

## 2020-12-16 MED ORDER — LIVING WELL WITH DIABETES BOOK
Freq: Once | Status: AC
  Filled 2020-12-16: qty 1

## 2020-12-16 MED ORDER — POTASSIUM CHLORIDE CRYS ER 20 MEQ PO TBCR
40.0000 meq | EXTENDED_RELEASE_TABLET | Freq: Once | ORAL | Status: AC
  Administered 2020-12-16: 40 meq via ORAL
  Filled 2020-12-16: qty 2

## 2020-12-16 MED ORDER — INSULIN ASPART 100 UNIT/ML IJ SOLN
0.0000 [IU] | Freq: Three times a day (TID) | INTRAMUSCULAR | Status: DC
  Administered 2020-12-16: 5 [IU] via SUBCUTANEOUS
  Administered 2020-12-16: 2 [IU] via SUBCUTANEOUS
  Administered 2020-12-16: 5 [IU] via SUBCUTANEOUS
  Administered 2020-12-17: 3 [IU] via SUBCUTANEOUS
  Administered 2020-12-17: 1 [IU] via SUBCUTANEOUS
  Administered 2020-12-18: 3 [IU] via SUBCUTANEOUS
  Administered 2020-12-18: 5 [IU] via SUBCUTANEOUS
  Administered 2020-12-18: 1 [IU] via SUBCUTANEOUS
  Administered 2020-12-19 (×2): 3 [IU] via SUBCUTANEOUS
  Administered 2020-12-19: 2 [IU] via SUBCUTANEOUS
  Administered 2020-12-20: 1 [IU] via SUBCUTANEOUS
  Administered 2020-12-20: 3 [IU] via SUBCUTANEOUS
  Administered 2020-12-20: 5 [IU] via SUBCUTANEOUS
  Administered 2020-12-21: 2 [IU] via SUBCUTANEOUS
  Administered 2020-12-21: 5 [IU] via SUBCUTANEOUS
  Administered 2020-12-21: 7 [IU] via SUBCUTANEOUS
  Administered 2020-12-22: 1 [IU] via SUBCUTANEOUS

## 2020-12-16 MED ORDER — INSULIN ASPART 100 UNIT/ML IJ SOLN
0.0000 [IU] | Freq: Every day | INTRAMUSCULAR | Status: DC
  Administered 2020-12-16: 2 [IU] via SUBCUTANEOUS

## 2020-12-16 MED ORDER — CHLORHEXIDINE GLUCONATE CLOTH 2 % EX PADS
6.0000 | MEDICATED_PAD | Freq: Every day | CUTANEOUS | Status: DC
  Administered 2020-12-16 – 2020-12-20 (×5): 6 via TOPICAL

## 2020-12-16 NOTE — Progress Notes (Signed)
PROGRESS NOTE    Colton Bush  [id number] DOB: 10/22/1955 DOA: 12/15/2020 PCP: [name], MD     Brief Narrative:  Colton Bush is a 65 y.o. male with medical history significant of hypertension, hyperlipidemia, recurrent pancreatic cancer who presents after his wife found him unresponsive this morning.  Most of the history is obtained from the patient's wife who is present at bedside.  She states that normally he is up before she is, but this morning he was still in bed.  After little while she went and checked on him and noticed that he was drooling of the side of his mouth and had sonorous respirations.  Denied seeing any seizure-like activity.  Attempted multiple times to wake him and thereafter called 911.  They found his blood sugar to be around 41.  Patient had last taken Levemir approximately 20 units the night before, but did not eat dinner.  Since he had been home from rehab he reportedly had been eating regularly.  However, patient reports that he recently he had just started feeling bad over the last 2 to 3 days.  Wife also reports that over the last 2 to 3 days his blood sugars that intermittently been dropping into the 60-70s at home.  After he was discharged from rehab he reported that they sent him home on MS Contin as well as hydrocodone, but he did not like the way the made him feel and has not really taken it.  Patient just recently been hospitalized for right hip fracture status post intramedullary nail in 09/2020. His hospital course was complicated E. coli UTI along with strep anginosis bacteremia in 1 of 4 blood cultures treated with Rocephin and then amoxicillin for total of 7 days.  Patient was sent to a rehab facility, but subsequently had to be hospitalized from 4/8-4/15 with severe sepsis found to have liver abscesses status post JP drain x2 and right empyema.  During his hospitalization patient was noted to be acutely confused for which imaging studies of the brain  revealed subdural hematomas requiring burr holes.  He was seen by ID and recommended to continue on Rocephin and oral metronidazole to complete a total of 6 weeks (5/24). Patient was noted to have concern for probable seizures for which he was started on Keppra.  Patient was he went to inpatient rehab from 4/15-5/11.  After that he was able to be discharged from rehab he presented back to the emergency department on 5/12 and again on 5/16 for weakness.  He was now hospitalized on 5/18 for hypoglycemia.  New events last 24 hours / Subjective: Hypoglycemia has resolved, D10 IV fluid discontinued.  Patient is alert, without any new complaints on examination.  Denies any fevers, chills, lightheadedness, dizziness, chest pain, cough, shortness of breath, nausea, vomiting.  He states that he is unsure what happened, but has been feeling weak since discharge from inpatient rehab on 5/11.  Denies any dysuria, pelvic discomfort.  He does have a chronic Foley catheter in place.  Assessment & Plan:   Principal Problem:   Hypoglycemia Active Problems:   Hyperlipidemia, mixed   Essential hypertension   GERD without esophagitis   Liver abscess   Bladder outlet obstruction   Hypokalemia   Hypoglycemia with history of diabetes mellitus type 2, poorly controlled -Hypoglycemia resolved and now hyperglycemic  -Appreciate diabetic coordinator -Resume Levemir, sliding scale insulin  Leukocytosis -Blood cultures pending -UA and urine culture pending -Continue outpatient Rocephin, Flagyl for liver abscesses  through 5/24 -CT A/P: Subtle/questionable edema within the medial cortex of the RIGHT kidney, and new fluid stranding overlying the upper pole of the RIGHT kidney. This may represent mild/early pyelonephritis. Complex/fluid collection at the RIGHT lung base is partially imaged, more completely imaged on earlier abdomen CT of 11/25/2020, compatible with previously described empyema.  -Check chest  CT  Liver abscesses -Has been status post JP drain x2 -Complete outpatient Rocephin, Flagyl through 5/24 -CT A/P: No recurrent abscess  Empyema -CT A/P: Complex/fluid collection at the RIGHT lung base is partially imaged, more completely imaged on earlier abdomen CT of 11/25/2020, compatible with previously described empyema. Consider complete characterization with chest CT. -Check chest CT  Recurrent pancreatic cancer -History of Whipple procedure status post chemo and radiation -Follows with Dr. [name]   Hypotension -Continue midodrine  Seizure disorder -Continue Keppra  History of subdural hematoma status post bur hole 4/12 -Repeat CT head 5/16: No significant change in size or appearance of thin left convexity subdural hematoma without significant mass effect or midline shift. No acute interval change since 12/10/2020.  BPH with chronic indwelling Foley catheter -Continue Flomax  Recent right hip fracture status postrepair by intramedullary nail 09/2020 -PT OT recommending SNF placement   In agreement with assessment of the pressure ulcer as below:  Pressure Injury 11/07/20 Heel Right Deep Tissue Pressure Injury - Purple or maroon localized area of discolored intact skin or blood-filled blister due to damage of underlying soft tissue from pressure and/or shear. (Active)  11/07/20 2000  Location: Heel  Location Orientation: Right  Staging: Deep Tissue Pressure Injury - Purple or maroon localized area of discolored intact skin or blood-filled blister due to damage of underlying soft tissue from pressure and/or shear.  Wound Description (Comments):   Present on Admission:      Pressure Injury 12/15/20 Heel Right Deep Tissue Pressure Injury - Purple or maroon localized area of discolored intact skin or blood-filled blister due to damage of underlying soft tissue from pressure and/or shear. (Active)  12/15/20 1800  Location: Heel  Location Orientation: Right  Staging:  Deep Tissue Pressure Injury - Purple or maroon localized area of discolored intact skin or blood-filled blister due to damage of underlying soft tissue from pressure and/or shear.  Wound Description (Comments):   Present on Admission: Yes         DVT prophylaxis:  enoxaparin (LOVENOX) injection 40 mg Start: 12/15/20 1615  Code Status:     Code Status Orders  (From admission, onward)         Start     Ordered   12/15/20 1609  Full code  Continuous        12/15/20 1609        Code Status History    Date Active Date Inactive Code Status Order ID Comments User Context   11/12/2020 1820 12/08/2020 1821 Full Code [id number]  Colton Bush, Colton Bush Inpatient   11/05/2020 2348 11/12/2020 1756 Full Code [id number]  Vernelle Emerald, MD ED   10/18/2020 2223 10/28/2020 1955 Full Code [id number]  Doran Heater, DO ED   06/19/2017 0232 06/20/2017 2149 Full Code [id number]  Rise Patience, MD ED   09/19/2016 1425 09/25/2016 2116 Full Code [id number]  Stark Klein, MD Inpatient   02/27/2016 2042 03/02/2016 1607 Full Code [id number]  Benito Mccreedy, MD Inpatient   02/27/2016 2042 02/27/2016 2042 Full Code [id number]  Benito Mccreedy, MD Inpatient   08/18/2011 2216 08/23/2011 2339 Full Code [id number]  Hovander, Floreen Comber,  RN Inpatient   Advance Care Planning Activity     Family Communication: None at bedside Disposition Plan:  Status is: Observation  The patient will require care spanning > 2 midnights and should be moved to inpatient because: Unsafe d/c plan and Inpatient level of care appropriate due to severity of illness  Dispo: The patient is from: Home              Anticipated d/c is to: SNF              Patient currently is not medically stable to d/c.   Difficult to place patient No    Antimicrobials:  Anti-infectives (From admission, onward)   Start     Dose/Rate Route Frequency Ordered Stop   12/15/20 2200  cefTRIAXone (ROCEPHIN) 2 g in sodium chloride 0.9 % 100 mL IVPB         2 g 200 mL/hr over 30 Minutes Intravenous Every 12 hours 12/15/20 1650 12/21/20 2159   12/15/20 1645  cefTRIAXone (ROCEPHIN) IVPB  Status:  Discontinued        2 g Intravenous Every 12 hours 12/15/20 1631 12/15/20 1645   12/15/20 1645  metroNIDAZOLE (FLAGYL) tablet 500 mg       Note to Pharmacy: Treat through 12/21/20     500 mg Oral Every 8 hours 12/15/20 1631 12/21/20 2359        Objective: Vitals:   12/15/20 2012 12/16/20 0004 12/16/20 0519 12/16/20 1157  BP: 123/63 124/62 132/66 118/64  Pulse: 81 75 67 72  Resp: 18 18 18 16   Temp: 98.7 F (37.1 C) 98.5 F (36.9 C) 98.8 F (37.1 C) 98 F (36.7 C)  TempSrc: Oral Oral Oral Oral  SpO2: 94% 92% 94% 92%  Weight:   73.3 kg   Height:        Intake/Output Summary (Last 24 hours) at 12/16/2020 1410 Last data filed at 12/16/2020 1251 Gross per 24 hour  Intake 580 ml  Output 2177 ml  Net -1597 ml   Filed Weights   12/15/20 1201 12/16/20 0519  Weight: 86.2 kg 73.3 kg    Examination:  General exam: Appears calm and comfortable  Respiratory system: Clear to auscultation. Respiratory effort normal. No respiratory distress. No conversational dyspnea.  Cardiovascular system: S1 & S2 heard, RRR. No murmurs. No pedal edema. Gastrointestinal system: Abdomen is nondistended, soft and nontender. Normal bowel sounds heard. Central nervous system: Alert and oriented. No focal neurological deficits. Speech clear.  Extremities: Symmetric in appearance  Skin: No rashes, lesions or ulcers on exposed skin  Psychiatry: Judgement and insight appear normal. Mood & affect appropriate.   Data Reviewed: I have personally reviewed following labs and imaging studies  CBC: Recent Labs  Lab 12/09/20 1656 12/13/20 2015 12/15/20 1155 12/15/20 1202 12/16/20 0348  WBC 14.5* 13.5* 11.6*  --  14.7*  NEUTROABS 12.2* 11.1* 10.1*  --   --   HGB 10.9* 9.7* 10.4* 11.6* 9.8*  HCT 33.7* 30.4* 32.7* 34.0* 30.4*  MCV 88.5 89.9 89.8  --  89.1  PLT  577* 361 374  --  841   Basic Metabolic Panel: Recent Labs  Lab 12/09/20 1656 12/13/20 2015 12/15/20 1155 12/15/20 1202 12/15/20 1610 12/16/20 0348  NA 135 135 139 140  --  135  K 4.0 2.8* 2.6* 2.7*  --  3.5  CL 100 98 101 98  --  100  CO2 24 28 31   --   --  27  GLUCOSE 338* 183*  89 84  --  286*  BUN 8 14 10 11   --  7*  CREATININE 0.70 0.38* 0.47* 0.30*  --  0.59*  CALCIUM 9.1 8.3* 8.3*  --   --  8.4*  MG  --   --   --   --  2.3  --    GFR: Estimated Creatinine Clearance: 92.9 mL/min (A) (by C-G formula based on SCr of 0.59 mg/dL (L)). Liver Function Tests: Recent Labs  Lab 12/09/20 1656 12/13/20 2015 12/15/20 1155  AST 86* 46* 21  ALT 50* 34 26  ALKPHOS 1,715* 1,538* 1,159*  BILITOT 0.3 0.4 0.7  PROT 8.3* 6.8 6.9  ALBUMIN 3.1* 2.8* 2.7*   No results for input(s): LIPASE, AMYLASE in the last 168 hours. No results for input(s): AMMONIA in the last 168 hours. Coagulation Profile: Recent Labs  Lab 12/15/20 1155  INR 1.2   Cardiac Enzymes: No results for input(s): CKTOTAL, CKMB, CKMBINDEX, TROPONINI in the last 168 hours. BNP (last 3 results) No results for input(s): PROBNP in the last 8760 hours. HbA1C: No results for input(s): HGBA1C in the last 72 hours. CBG: Recent Labs  Lab 12/15/20 2015 12/16/20 0006 12/16/20 0537 12/16/20 0804 12/16/20 1200  GLUCAP 274* 374* 261* 222* 299*   Lipid Profile: No results for input(s): CHOL, HDL, LDLCALC, TRIG, CHOLHDL, LDLDIRECT in the last 72 hours. Thyroid Function Tests: No results for input(s): TSH, T4TOTAL, FREET4, T3FREE, THYROIDAB in the last 72 hours. Anemia Panel: No results for input(s): VITAMINB12, FOLATE, FERRITIN, TIBC, IRON, RETICCTPCT in the last 72 hours. Sepsis Labs: Recent Labs  Lab 12/15/20 1158 12/15/20 1814  LATICACIDVEN 0.9 1.8    Recent Results (from the past 240 hour(s))  Resp Panel by RT-PCR (Flu A&B, Covid) Nasopharyngeal Swab     Status: None   Collection Time: 12/10/20  9:30 AM    Specimen: Nasopharyngeal Swab; Nasopharyngeal(NP) swabs in vial transport medium  Result Value Ref Range Status   SARS Coronavirus 2 by RT PCR NEGATIVE NEGATIVE Final    Comment: (NOTE) SARS-CoV-2 target nucleic acids are NOT DETECTED.  The SARS-CoV-2 RNA is generally detectable in upper respiratory specimens during the acute phase of infection. The lowest concentration of SARS-CoV-2 viral copies this assay can detect is 138 copies/mL. A negative result does not preclude SARS-Cov-2 infection and should not be used as the sole basis for treatment or other patient management decisions. A negative result may occur with  improper specimen collection/handling, submission of specimen other than nasopharyngeal swab, presence of viral mutation(s) within the areas targeted by this assay, and inadequate number of viral copies(<138 copies/mL). A negative result must be combined with clinical observations, patient history, and epidemiological information. The expected result is Negative.  Fact Sheet for Patients:  EntrepreneurPulse.com.au  Fact Sheet for Healthcare Providers:  IncredibleEmployment.be  This test is no t yet approved or cleared by the [place] FDA and  has been authorized for detection and/or diagnosis of SARS-CoV-2 by FDA under an Emergency Use Authorization (EUA). This EUA will remain  in effect (meaning this test can be used) for the duration of the COVID-19 declaration under Section 564(b)(1) of the Act, 21 U.S.C.section 360bbb-3(b)(1), unless the authorization is terminated  or revoked sooner.       Influenza A by PCR NEGATIVE NEGATIVE Final   Influenza B by PCR NEGATIVE NEGATIVE Final    Comment: (NOTE) The Xpert Xpress SARS-CoV-2/FLU/RSV plus assay is intended as an aid in the diagnosis of influenza from Nasopharyngeal swab  specimens and should not be used as a sole basis for treatment. Nasal washings and aspirates are  unacceptable for Xpert Xpress SARS-CoV-2/FLU/RSV testing.  Fact Sheet for Patients: EntrepreneurPulse.com.au  Fact Sheet for Healthcare Providers: IncredibleEmployment.be  This test is not yet approved or cleared by the [place] FDA and has been authorized for detection and/or diagnosis of SARS-CoV-2 by FDA under an Emergency Use Authorization (EUA). This EUA will remain in effect (meaning this test can be used) for the duration of the COVID-19 declaration under Section 564(b)(1) of the Act, 21 U.S.C. section 360bbb-3(b)(1), unless the authorization is terminated or revoked.  Performed at [facility], [place] [address], [place], [place] [address]   Resp Panel by RT-PCR (Flu A&B, Covid) Nasopharyngeal Swab     Status: None   Collection Time: 12/15/20 11:52 AM   Specimen: Nasopharyngeal Swab; Nasopharyngeal(NP) swabs in vial transport medium  Result Value Ref Range Status   SARS Coronavirus 2 by RT PCR NEGATIVE NEGATIVE Final    Comment: (NOTE) SARS-CoV-2 target nucleic acids are NOT DETECTED.  The SARS-CoV-2 RNA is generally detectable in upper respiratory specimens during the acute phase of infection. The lowest concentration of SARS-CoV-2 viral copies this assay can detect is 138 copies/mL. A negative result does not preclude SARS-Cov-2 infection and should not be used as the sole basis for treatment or other patient management decisions. A negative result may occur with  improper specimen collection/handling, submission of specimen other than nasopharyngeal swab, presence of viral mutation(s) within the areas targeted by this assay, and inadequate number of viral copies(<138 copies/mL). A negative result must be combined with clinical observations, patient history, and epidemiological information. The expected result is Negative.  Fact Sheet for Patients:  EntrepreneurPulse.com.au  Fact Sheet for Healthcare  Providers:  IncredibleEmployment.be  This test is no t yet approved or cleared by the [place] FDA and  has been authorized for detection and/or diagnosis of SARS-CoV-2 by FDA under an Emergency Use Authorization (EUA). This EUA will remain  in effect (meaning this test can be used) for the duration of the COVID-19 declaration under Section 564(b)(1) of the Act, 21 U.S.C.section 360bbb-3(b)(1), unless the authorization is terminated  or revoked sooner.       Influenza A by PCR NEGATIVE NEGATIVE Final   Influenza B by PCR NEGATIVE NEGATIVE Final    Comment: (NOTE) The Xpert Xpress SARS-CoV-2/FLU/RSV plus assay is intended as an aid in the diagnosis of influenza from Nasopharyngeal swab specimens and should not be used as a sole basis for treatment. Nasal washings and aspirates are unacceptable for Xpert Xpress SARS-CoV-2/FLU/RSV testing.  Fact Sheet for Patients: EntrepreneurPulse.com.au  Fact Sheet for Healthcare Providers: IncredibleEmployment.be  This test is not yet approved or cleared by the [place] FDA and has been authorized for detection and/or diagnosis of SARS-CoV-2 by FDA under an Emergency Use Authorization (EUA). This EUA will remain in effect (meaning this test can be used) for the duration of the COVID-19 declaration under Section 564(b)(1) of the Act, 21 U.S.C. section 360bbb-3(b)(1), unless the authorization is terminated or revoked.  Performed at [facility], [place] [address], [place], [place] [address]   Culture, blood (routine x 2)     Status: None (Preliminary result)   Collection Time: 12/15/20 12:00 PM   Specimen: BLOOD LEFT HAND  Result Value Ref Range Status   Specimen Description BLOOD LEFT HAND  Final   Special Requests   Final    BOTTLES DRAWN AEROBIC  AND ANAEROBIC Blood Culture results may not be optimal due to an inadequate volume of blood received in culture bottles    Culture   Final    NO GROWTH < 24 HOURS Performed at [place] [address] [place], [place] [address]    Report Status PENDING  Incomplete  Culture, blood (routine x 2)     Status: None (Preliminary result)   Collection Time: 12/15/20 12:06 PM   Specimen: BLOOD RIGHT HAND  Result Value Ref Range Status   Specimen Description BLOOD RIGHT HAND  Final   Special Requests   Final    BOTTLES DRAWN AEROBIC AND ANAEROBIC Blood Culture results may not be optimal due to an inadequate volume of blood received in culture bottles   Culture   Final    NO GROWTH < 24 HOURS Performed at [facility], [place] [address], [place], [place] [address]    Report Status PENDING  Incomplete      Radiology Studies: DG Chest Port 1 View  Result Date: 12/15/2020 CLINICAL DATA:  sob EXAM: PORTABLE CHEST 1 VIEW COMPARISON:  Radiograph 12/13/2020, CT 12/10/2020 FINDINGS: Unchanged cardiomediastinal silhouette. Unchanged right upper extremity PICC. Unchanged opacity overlying the right mid lung corresponding to the known posterior loculated pleural collection. There is no new airspace disease. Bones are unchanged. IMPRESSION: Unchanged right midlung opacity corresponding to known right pleural collection. No new airspace disease. Electronically Signed   By: Maurine Simmering   On: 12/15/2020 12:52      Scheduled Meds: . Chlorhexidine Gluconate Cloth  6 each Topical Daily  . citalopram  20 mg Oral Daily  . diclofenac Sodium  2 g Topical QID  . enoxaparin (LOVENOX) injection  40 mg Subcutaneous Q24H  . hydrocerin  1 application Topical BID  . hydrocortisone cream   Topical BID  . insulin aspart  0-5 Units Subcutaneous QHS  . insulin aspart  0-9 Units Subcutaneous TID WC  . insulin detemir  20 Units Subcutaneous Daily  . levETIRAcetam  1,000 mg Oral BID  . lidocaine  1 patch Transdermal Q24H  . lipase/protease/amylase  24,000 Units Oral TID AC  . living well with diabetes book   Does not apply Once   . metroNIDAZOLE  500 mg Oral Q8H  . midodrine  5 mg Oral TID WC  . pantoprazole  40 mg Oral Daily  . sodium chloride flush  3 mL Intravenous Q12H  . tamsulosin  0.8 mg Oral Daily   Continuous Infusions: . cefTRIAXone (ROCEPHIN)  IV 2 g (12/16/20 0840)     LOS: 0 days      Time spent: 35 minutes   Dessa Phi, DO [facility] 12/16/2020, 2:10 PM   Available via Epic secure chat 7am-7pm After these hours, please refer to coverage provider listed on [email or web address]

## 2020-12-16 NOTE — Evaluation (Signed)
Occupational Therapy Evaluation Patient Details Name: Colton Bush MRN: [id number] DOB: [birth year] Today's Date: 12/16/2020    History of Present Illness Pt is a 65 y.o. male admitted 12/15/20 found unresponsive by wife, pt hypoglycemic. Head CT negative for acute changes. CXR with known R pleural effusion. Workup for hypoglycemia, hypokalemia. Of note, pt with recent admission 11/05/20 with sepsis s/p burr hole evacuation of L SDH, with CIR admission 11/12/20-12/08/20, recent ED visits 5/12 and 5/16 for weakness. Other PMH includes HTN, hernia, scoliosis, ACDF (2013), R femur fx (09/2020).   Clinical Impression   Pt presents with decline in function and safety with ADLs and ADL mobility with impaired strength, balance and endurance. PTA, pt recently d/c home from CIR for post-acute rehab, has been working with [facility] rehab services, assit from wife with LB ADLs, ambulating short distances with RW, also using w/c.Today, pt requiring up to max A+2 for ambulation with RW. Pt would benefit from acute OT services to address impairments to maximize level of function and safety    Follow Up Recommendations  SNF    Equipment Recommendations  Other (comment) (TBD at next venue of care)    Recommendations for Other Services       Precautions / Restrictions Precautions Precautions: Fall Restrictions Weight Bearing Restrictions: No RLE Weight Bearing: Weight bearing as tolerated      Mobility Bed Mobility Overal bed mobility: Needs Assistance Bed Mobility: Supine to Sit     Supine to sit: Supervision;HOB elevated     General bed mobility comments: Increased time and effort, use of bed rail    Transfers Overall transfer level: Needs assistance Equipment used: Rolling walker (2 wheeled) Transfers: Sit to/from [facility] Sit to Stand: Min assist;+2 physical assistance;+2 safety/equipment Stand pivot transfers: Min assist;+2 physical assistance;+2 safety/equipment        General transfer comment: Increased time and effort to achieve fully upright posture, forward flexed posture with heavy reliance on UE support; minA for stability, max A +2 for ambulation with RW around bedside to recliner    Balance Overall balance assessment: Needs assistance Sitting-balance support: No upper extremity supported;Feet supported Sitting balance-[name]: Fair     Standing balance support: Bilateral upper extremity supported;During functional activity Standing balance-[name]: Poor Standing balance comment: Heavy reliance on UE support                           ADL either performed or assessed with clinical judgement   ADL                                               Vision Baseline Vision/History: Wears glasses Wears Glasses: Reading only Patient Visual Report: No change from baseline       Perception     Praxis      Pertinent Vitals/Pain Pain Assessment: No/denies pain Faces Pain Scale: Hurts little more Pain Location: Generalized Pain Descriptors / Indicators: Discomfort Pain Intervention(s): Monitored during session;Repositioned     Hand Dominance Right   Extremity/Trunk Assessment Upper Extremity Assessment Upper Extremity Assessment: Generalized weakness   Lower Extremity Assessment Lower Extremity Assessment: Defer to PT evaluation RLE Deficits / Details: R hip flex <3/5, knee flex/ext 3/5, ankle 4/5 LLE Deficits / Details: L ankle DF <3/5   Cervical / Trunk Assessment Cervical / Trunk Assessment: Kyphotic  Communication Communication Communication: No difficulties   Cognition Arousal/Alertness: Awake/alert Behavior During Therapy: WFL for tasks assessed/performed;Flat affect Overall Cognitive Status: No family/caregiver present to determine baseline cognitive functioning Area of Impairment: Attention;Following commands;Safety/judgement;Awareness;Problem solving                   Current  Attention Level: Selective   Following Commands: Follows one step commands consistently;Follows one step commands with increased time Safety/Judgement: Decreased awareness of deficits;Decreased awareness of safety Awareness: Emergent Problem Solving: Slow processing;Difficulty sequencing;Requires verbal cues;Requires tactile cues     General Comments       Exercises     Shoulder Instructions      Home Living Family/patient expects to be discharged to:: Private residence Living Arrangements: Spouse/significant other Available Help at Discharge: Family;Available 24 hours/day Type of Home: House Home Access: Stairs to enter [employer] of Steps: (side of house) 1 step then laundry room then 2 steps to main level Entrance Stairs-Rails: None Home Layout: One level     Bathroom Shower/Tub: Walk-in shower;Tub/shower unit   Bathroom Toilet: Standard     Home Equipment: Environmental consultant - 2 wheels;Wheelchair - manual;Shower seat   Additional Comments: shower seat does not fit in shower - has done bird bath at sink  Lives With: Spouse    Prior Functioning/Environment Level of Independence: Needs assistance  Gait / Transfers Assistance Needed: Reports limited ambulation with RW, has been using w/c around house due to fearful of falling and LE weakness ADL's / Homemaking Assistance Needed: Washup/sponge bath at sink; assist from wife for LB selfcare            OT Problem List: Decreased strength;Impaired balance (sitting and/or standing);Decreased cognition;Decreased safety awareness;Decreased activity tolerance;Decreased knowledge of use of DME or AE      OT Treatment/Interventions: Self-care/ADL training;Therapeutic exercise;Patient/family education;Balance training;Therapeutic activities;DME and/or AE instruction    OT Goals(Current goals can be found in the care plan section) Acute Rehab OT Goals Patient Stated Goal: get better; interested in post-acute rehab OT Goal  Formulation: With patient Time For Goal Achievement: 12/30/20 Potential to Achieve Goals: Fair ADL Goals Pt Will Perform Grooming: with min assist;with min guard assist;standing Pt Will Perform Upper Body Bathing: with supervision;with set-up;sitting Pt Will Perform Lower Body Bathing: with mod assist Pt Will Perform Upper Body Dressing: with supervision;with set-up;sitting Pt Will Transfer to Toilet: with min assist;with min guard assist;stand pivot transfer;ambulating Pt Will Perform Toileting - Clothing Manipulation and hygiene: with mod assist;sitting/lateral leans;sit to/from stand  OT Frequency: Min 2X/week   Barriers to D/C:            Co-evaluation PT/OT/SLP Co-Evaluation/Treatment: Yes Reason for Co-Treatment: For patient/therapist safety;To address functional/ADL transfers PT goals addressed during session: Mobility/safety with mobility;Balance;Proper use of DME OT goals addressed during session: ADL's and self-care;Proper use of Adaptive equipment and DME      AM-PAC OT "6 Clicks" Daily Activity     Outcome Measure Help from another person eating meals?: None Help from another person taking care of personal grooming?: A Little Help from another person toileting, which includes using toliet, bedpan, or urinal?: A Lot Help from another person bathing (including washing, rinsing, drying)?: A Lot Help from another person to put on and taking off regular upper body clothing?: A Little Help from another person to put on and taking off regular lower body clothing?: Total 6 Click Score: 15   End of Session Equipment Utilized During Treatment: Gait belt;Rolling walker Nurse Communication: Mobility status  Activity  Tolerance: Patient limited by fatigue Patient left: in chair;with call bell/phone within reach;with chair alarm set  OT Visit Diagnosis: Unsteadiness on feet (R26.81);Other abnormalities of gait and mobility (R26.89);History of falling (Z91.81);Muscle weakness  (generalized) (M62.81);Pain;Other symptoms and signs involving cognitive function Pain - Right/Left: Left Pain - part of body: Leg                Time: 1007-1031 OT Time Calculation (min): 24 min Charges:  OT General Charges $OT Visit: 1 Visit OT Evaluation $OT Eval Moderate Complexity: 1 Mod    Britt Bottom 12/16/2020, 1:38 PM

## 2020-12-16 NOTE — Progress Notes (Addendum)
Inpatient Diabetes Program Recommendations  AACE/ADA: New Consensus Statement on Inpatient Glycemic Control (2015)  Target Ranges:  Prepandial:   less than 140 mg/dL      Peak postprandial:   less than 180 mg/dL (1-2 hours)      Critically ill patients:  140 - 180 mg/dL   Lab Results  Component Value Date   GLUCAP 222 (H) 12/16/2020   HGBA1C 10.8 (H) 10/18/2020    Review of Glycemic Control Results for Colton Bush, Colton "[name]" (MRN [id number]) as of 12/16/2020 12:01  Ref. Range 12/15/2020 16:04 12/15/2020 20:15 12/16/2020 00:06 12/16/2020 05:37 12/16/2020 08:04  Glucose-Capillary Latest Ref Range: 70 - 99 mg/dL 158 (H) 274 (H) 374 (H) 261 (H) 222 (H)   Diabetes history: DM2 Outpatient Diabetes medications: Levemir 15 units BID, Novolog 2-13 units TID Current orders for Inpatient glycemic control: Novolog 0-9 units TID   Inpatient Diabetes Program Recommendations:     Please consider;  Levemir 20 units daily Novolog 0-5 units QHS  Addendum @ 0539- Spoke with patient at bedside.  Confirmed above home medications.  Reviewed patient's current A1c of 10.8% (average blood sugar of 236 mg/dL). Explained what a A1c is and what it measures. Also reviewed goal A1c with patient, importance of good glucose control @ home, and blood sugar goals.  He is current with Dr. Chalmers Cater.  He checks his blood sugar 3 x a day.  He states he gave himself his normal dose of Levemir 20 units and went to bed and woke up in the hospital; he states he is unsure what happened.  He denies frequent hypoglycemia at home.  Would benefit from a [employer] CGM and he is interested.  Will reach out to MD for order and provide patient with prior authorization form for PCP/endocrinologist.  Ordered LWWD booklet.    Will continue to follow while inpatient.  Thank you, Reche Dixon, RN, BSN Diabetes Coordinator Inpatient Diabetes Program [phone] (team pager from 8a-5p)

## 2020-12-16 NOTE — Evaluation (Signed)
Physical Therapy Evaluation Patient Details Name: Colton Bush MRN: [id number] DOB: [birth year] Today's Date: 12/16/2020   History of Present Illness  Pt is a 65 y.o. male admitted 12/15/20 found unresponsive by wife, pt hypoglycemic. Head CT negative for acute changes. CXR with known R pleural effusion. Workup for hypoglycemia, hypokalemia. Of note, pt with recent admission 11/05/20 with sepsis s/p burr hole evacuation of L SDH, with CIR admission 11/12/20-12/08/20, recent ED visits 5/12 and 5/16 for weakness. Other PMH includes HTN, hernia, scoliosis, ACDF (2013), R femur fx (09/2020).    Clinical Impression  Pt presents with an overall decrease in functional mobility secondary to above. PTA, pt recently d/c home from CIR for post-acute rehab, has been working with [facility] rehab services, ambulating short distances with RW, also using w/c; lives with wife who assists with ADLs as needed. Today, pt requiring up to maxA+2 for ambulation with RW. Pt limited by generalized weakness, decreased activity tolerance and significantly impaired balance strategies/postural reactions. Pt would benefit from continued acute PT services to maximize functional mobility and independence prior to d/c with SNF-level therapies.    Follow Up Recommendations SNF;Supervision for mobility/OOB    Equipment Recommendations  None recommended by PT    Recommendations for Other Services       Precautions / Restrictions Precautions Precautions: Fall Restrictions Weight Bearing Restrictions: No RLE Weight Bearing: Weight bearing as tolerated      Mobility  Bed Mobility Overal bed mobility: Needs Assistance Bed Mobility: Supine to Sit     Supine to sit: Supervision;HOB elevated     General bed mobility comments: Increased time and effort, use of bed rail    Transfers Overall transfer level: Needs assistance Equipment used: Rolling walker (2 wheeled) Transfers: Sit to/from Stand Sit to Stand: Min assist          General transfer comment: Increased time and effort to achieve fully upright posture, forward flexed posture with heavy reliance on UE support; minA for stability  Ambulation/Gait Ambulation/Gait assistance: Min assist;Mod assist;Max assist;+2 physical assistance;+2 safety/equipment Gait Distance (Feet): 20 Feet Assistive device: Rolling walker (2 wheeled) Gait Pattern/deviations: Step-to pattern;Decreased weight shift to right;Decreased step length - left;Antalgic;Trunk flexed;Decreased dorsiflexion - left Gait velocity: Decreased   General Gait Details: Slow, antalgic, unsteady gait with RW and initial minA+2 for stability; pt quick to fatigue requiring modA+2 progressing to maxA+2 for stability and RW management; max verbal/tactile cues for sequencing, including maintaining closer proximity to RW, upright posture, increased step length with LLE  Stairs            Wheelchair Mobility    Modified Rankin (Stroke Patients Only)       Balance Overall balance assessment: Needs assistance Sitting-balance support: No upper extremity supported;Feet supported Sitting balance-[name]: Fair     Standing balance support: Bilateral upper extremity supported;During functional activity Standing balance-[name]: Poor Standing balance comment: Heavy reliance on UE support                             Pertinent Vitals/Pain Pain Assessment: No/denies pain Faces Pain Scale: Hurts little more Pain Location: Generalized Pain Descriptors / Indicators: Discomfort Pain Intervention(s): Monitored during session;Repositioned    Home Living Family/patient expects to be discharged to:: Private residence Living Arrangements: Spouse/significant other Available Help at Discharge: Family;Available 24 hours/day Type of Home: House Home Access: Stairs to enter Entrance Stairs-Rails: None Entrance Stairs-Number of Steps: (side of house) 1 step then laundry  room then 2 steps to  main level Home Layout: One level Home Equipment: Walker - 2 wheels;Wheelchair - Sport and exercise psychologist Comments: shower seat does not fit in shower - has done bird bath at sink    Prior Function Level of Independence: Needs assistance   Gait / Transfers Assistance Needed: Reports limited ambulation with RW, has been using w/c around house due to fearful of falling and LE weakness  ADL's / Homemaking Assistance Needed: Washup/sponge bath at sink; assist from wife for LB selfcare        Hand Dominance   Dominant Hand: Right    Extremity/Trunk Assessment   Upper Extremity Assessment Upper Extremity Assessment: Generalized weakness    Lower Extremity Assessment Lower Extremity Assessment: Defer to PT evaluation RLE Deficits / Details: R hip flex <3/5, knee flex/ext 3/5, ankle 4/5 LLE Deficits / Details: L ankle DF <3/5    Cervical / Trunk Assessment Cervical / Trunk Assessment: Kyphotic  Communication   Communication: No difficulties  Cognition Arousal/Alertness: Awake/alert Behavior During Therapy: WFL for tasks assessed/performed;Flat affect Overall Cognitive Status: No family/caregiver present to determine baseline cognitive functioning Area of Impairment: Attention;Following commands;Safety/judgement;Awareness;Problem solving                   Current Attention Level: Selective   Following Commands: Follows one step commands consistently;Follows one step commands with increased time Safety/Judgement: Decreased awareness of deficits;Decreased awareness of safety Awareness: Emergent Problem Solving: Slow processing;Difficulty sequencing;Requires verbal cues;Requires tactile cues        General Comments      Exercises     Assessment/Plan    PT Assessment Patient needs continued PT services  PT Problem List Decreased strength;Decreased activity tolerance;Decreased balance;Decreased mobility;Decreased cognition;Decreased knowledge of use of  DME;Decreased safety awareness;Pain       PT Treatment Interventions DME instruction;Gait training;Stair training;Functional mobility training;Therapeutic activities;Therapeutic exercise;Balance training;Patient/family education;Cognitive remediation;Wheelchair mobility training    PT Goals (Current goals can be found in the Care Plan section)  Acute Rehab PT Goals Patient Stated Goal: get better; interested in post-acute rehab PT Goal Formulation: With patient Time For Goal Achievement: 12/30/20 Potential to Achieve Goals: Good    Frequency Min 2X/week   Barriers to discharge        Co-evaluation PT/OT/SLP Co-Evaluation/Treatment: Yes Reason for Co-Treatment: For patient/therapist safety;To address functional/ADL transfers PT goals addressed during session: Mobility/safety with mobility;Balance;Proper use of DME OT goals addressed during session: ADL's and self-care;Proper use of Adaptive equipment and DME       AM-PAC PT "6 Clicks" Mobility  Outcome Measure Help needed turning from your back to your side while in a flat bed without using bedrails?: A Little Help needed moving from lying on your back to sitting on the side of a flat bed without using bedrails?: A Little Help needed moving to and from a bed to a chair (including a wheelchair)?: A Lot Help needed standing up from a chair using your arms (e.g., wheelchair or bedside chair)?: A Little Help needed to walk in hospital room?: A Lot Help needed climbing 3-5 steps with a railing? : Total 6 Click Score: 14    End of Session Equipment Utilized During Treatment: Gait belt Activity Tolerance: Patient tolerated treatment well;Patient limited by fatigue Patient left: in chair;with call bell/phone within reach;with chair alarm set Nurse Communication: Mobility status PT Visit Diagnosis: Other abnormalities of gait and mobility (R26.89);Muscle weakness (generalized) (M62.81)    Time: 3244-0102 PT Time Calculation (min)  (ACUTE ONLY): 27  min   Charges:   PT Evaluation $PT Eval Moderate Complexity: 1 Mod     Mabeline Caras, PT, DPT Acute Rehabilitation Services  Pager [phone] Office [place] 12/16/2020, 1:33 PM

## 2020-12-17 ENCOUNTER — Ambulatory Visit: Payer: Medicare Other | Admitting: Radiation Oncology

## 2020-12-17 ENCOUNTER — Telehealth: Payer: Self-pay | Admitting: *Deleted

## 2020-12-17 DIAGNOSIS — E162 Hypoglycemia, unspecified: Secondary | ICD-10-CM

## 2020-12-17 DIAGNOSIS — K750 Abscess of liver: Secondary | ICD-10-CM | POA: Diagnosis not present

## 2020-12-17 DIAGNOSIS — J869 Pyothorax without fistula: Secondary | ICD-10-CM | POA: Diagnosis not present

## 2020-12-17 LAB — CBC
HCT: 31.6 % — ABNORMAL LOW (ref 39.0–52.0)
Hemoglobin: 10 g/dL — ABNORMAL LOW (ref 13.0–17.0)
MCH: 28.7 pg (ref 26.0–34.0)
MCHC: 31.6 g/dL (ref 30.0–36.0)
MCV: 90.5 fL (ref 80.0–100.0)
Platelets: 351 10*3/uL (ref 150–400)
RBC: 3.49 MIL/uL — ABNORMAL LOW (ref 4.22–5.81)
RDW: 18.3 % — ABNORMAL HIGH (ref 11.5–15.5)
WBC: 11.3 10*3/uL — ABNORMAL HIGH (ref 4.0–10.5)
nRBC: 0 % (ref 0.0–0.2)

## 2020-12-17 LAB — GLUCOSE, CAPILLARY
Glucose-Capillary: 32 mg/dL — CL (ref 70–99)
Glucose-Capillary: 119 mg/dL — ABNORMAL HIGH (ref 70–99)
Glucose-Capillary: 134 mg/dL — ABNORMAL HIGH (ref 70–99)
Glucose-Capillary: 132 mg/dL — ABNORMAL HIGH (ref 70–99)
Glucose-Capillary: 239 mg/dL — ABNORMAL HIGH (ref 70–99)
Glucose-Capillary: 183 mg/dL — ABNORMAL HIGH (ref 70–99)

## 2020-12-17 LAB — URINE CULTURE

## 2020-12-17 LAB — BASIC METABOLIC PANEL
Anion gap: 4 — ABNORMAL LOW (ref 5–15)
BUN: 5 mg/dL — ABNORMAL LOW (ref 8–23)
CO2: 30 mmol/L (ref 22–32)
Calcium: 8.3 mg/dL — ABNORMAL LOW (ref 8.9–10.3)
Chloride: 102 mmol/L (ref 98–111)
Creatinine, Ser: 0.37 mg/dL — ABNORMAL LOW (ref 0.61–1.24)
GFR, Estimated: >60 mL/min (ref >60)
Glucose, Bld: 70 mg/dL (ref 70–99)
Potassium: 2.9 mmol/L — ABNORMAL LOW (ref 3.5–5.1)
Sodium: 136 mmol/L (ref 135–145)

## 2020-12-17 MED ORDER — INSULIN DETEMIR 100 UNIT/ML ~~LOC~~ SOLN
10.0000 [IU] | Freq: Every day | SUBCUTANEOUS | Status: DC
  Administered 2020-12-17 – 2020-12-20 (×4): 10 [IU] via SUBCUTANEOUS
  Filled 2020-12-17 (×5): qty 0.1

## 2020-12-17 MED ORDER — LINEZOLID 600 MG PO TABS
600.0000 mg | ORAL_TABLET | Freq: Two times a day (BID) | ORAL | Status: DC
  Administered 2020-12-17 – 2020-12-20 (×6): 600 mg via ORAL
  Filled 2020-12-17 (×6): qty 1

## 2020-12-17 MED ORDER — DEXTROSE 50 % IV SOLN
INTRAVENOUS | Status: AC
  Administered 2020-12-17: 50 mL
  Filled 2020-12-17: qty 50

## 2020-12-17 MED ORDER — INSULIN ASPART 100 UNIT/ML IJ SOLN
3.0000 [IU] | Freq: Three times a day (TID) | INTRAMUSCULAR | Status: DC
  Administered 2020-12-17 – 2020-12-21 (×12): 3 [IU] via SUBCUTANEOUS

## 2020-12-17 MED ORDER — POTASSIUM CHLORIDE CRYS ER 20 MEQ PO TBCR
40.0000 meq | EXTENDED_RELEASE_TABLET | ORAL | Status: AC
  Administered 2020-12-17 (×2): 40 meq via ORAL
  Filled 2020-12-17 (×2): qty 2

## 2020-12-17 MED ORDER — LACTATED RINGERS IV BOLUS
500.0000 mL | Freq: Once | INTRAVENOUS | Status: AC
  Administered 2020-12-17: 500 mL via INTRAVENOUS

## 2020-12-17 NOTE — Progress Notes (Signed)
Inpatient Diabetes Program Recommendations  AACE/ADA: New Consensus Statement on Inpatient Glycemic Control (2015)  Target Ranges:  Prepandial:   less than 140 mg/dL      Peak postprandial:   less than 180 mg/dL (1-2 hours)      Critically ill patients:  140 - 180 mg/dL   Lab Results  Component Value Date   GLUCAP 119 (H) 12/17/2020   HGBA1C 10.8 (H) 10/18/2020    Review of Glycemic Control Results for Colton Bush, Colton "[name]" (MRN [id number]) as of 12/17/2020 10:15  Ref. Range 12/16/2020 08:04 12/16/2020 12:00 12/16/2020 17:11 12/16/2020 20:02 12/17/2020 04:27 12/17/2020 04:48 12/17/2020 05:05  Glucose-Capillary Latest Ref Range: 70 - 99 mg/dL 222 (H) 299 (H) 196 (H) 203 (H) 32 (LL) 134 (H) 119 (H)   Diabetes history:  DM2 Outpatient Diabetes medications:  Levemir 20 units BID and Novolog 2-13 units TID Current orders for Inpatient glycemic control:  Levemir 20 units QD, Novolog 0-9 units TID  Inpatient Diabetes Program Recommendations:     Noted hypoglycemia this morning.  Please decrease Levemir to 10 units QD.  Will continue to follow while inpatient.  Thank you, Reche Dixon, RN, BSN Diabetes Coordinator Inpatient Diabetes Program [phone] (team pager from 8a-5p)

## 2020-12-17 NOTE — Chronic Care Management (AMB) (Signed)
  Chronic Care Management   Outreach Note  12/17/2020 Name: Colton Bush MRN: [id number] DOB: Jun 09, 1956  Colton Bush is a 65 y.o. year old male who is a primary care patient of [name], MD. I reached out to [name] by phone today in response to a referral sent by [name]'s PCP, Dr. Sarajane Jews.     A telephone outreach was attempted today. The patient was referred to the case management team for assistance with care management and care coordination.   Follow Up Plan: Patient is currently admitted in [facility]

## 2020-12-17 NOTE — Progress Notes (Incomplete)
[name]   [phone] [phone]   Clinic Follow up Note   Patient Care Team: [name], MD as PCP - General (Family Medicine)  Date of Service:  12/17/2020  CHIEF COMPLAINT: Follow up pancreatic cancer  SUMMARY OF ONCOLOGIC HISTORY: Oncology History Overview Note  Cancer Staging Adenocarcinoma of head of pancreas [facility]) Staging form: Pancreas, AJCC 7th Edition - Clinical stage from 03/09/2016: Stage IIB (T2, N1, M0) - Signed by Truitt Merle, MD on 03/17/2016 - Pathologic stage from 11/17/2016: Stage IB (yT2, N0, cM0) - Signed by Truitt Merle, MD on 12/10/2016     Adenocarcinoma of head of pancreas ([place])  02/27/2016 Imaging   CT chest, abdomen and pelvis with contrast showed ill-defined heterogeneity of pancreatic head, highly suspicious for malignancy, ) quit about and biliary ductal dilatation, mildly prominent lymph nodes in the upper abdomen, largest 2 cm in the portocaval . No other metastasis.    03/09/2016 Initial Diagnosis   Adenocarcinoma of head of pancreas ([place])   03/09/2016 Procedure   Upper EUS showed a 3.5 cm irregular mass in the pancreatic head, causing pancreatic and biliary duct obstruction. The mass involves the portal vein 422 mm, strongly suggesting invasion. There is suspicious nearby adenopathy   03/09/2016 Initial Biopsy   Fine-needle aspiration of the pancreatic mass from EUS showed malignant cells consistent with adenocarcinoma.    03/28/2016 - 05/24/2016 Chemotherapy   neoadjuvant chemo with FOLFIRINOX every 2 weeks, for 5 cycles    06/28/2016 - 08/10/2016 Radiation Therapy   Neoadjuvant radiation by Dr. [name] Site/dose:   Pancreas treated to 45 Gy in 25 fractions. The Pancreas was then boosted to 54 Gy in 5 fractions.   06/28/2016 - 08/10/2016 Chemotherapy   Xeloda 2000 mg in the morning and 1500 mg in the evening, on the day of radiation.   09/04/2016 Imaging   CT Chest Abdomen Pelvis IMPRESSION: 1. Poorly defined  pancreatic head mass is grossly stable, with associated pancreatic ductal dilatation and portacaval adenopathy. No associated vascular encasement. 2. Common bile duct stent in place with stable biliary ductal dilatation. Left hepatic lobe atrophy. 3. Hepatomegaly.  Spleen is at the upper limits normal in size. 4. Aortic atherosclerosis (ICD10-170.0). Coronary artery calcification.   09/19/2016 Surgery   Diagnostic laparoscopy and possible whipple procedure by Dr. Barry Dienes. Whipple procedure aborted due to the pancreatic cancer involving SMV, SMA and portal veins.   09/19/2016 Pathology Results   Two liver lesions were noted during diagnostic laparoscopy and biopsy revealed benign hepatic parenchyma with assoicated fibrosis with no evidence of malignancy.   10/06/2016 - 10/20/2016 Chemotherapy   Gemcitabine and Abraxane weekly, on day 1, 8, and 15 every 28 days, starting on 10/06/2016, held after1 cycle due to pending surgery at [facility]   10/31/2016 Imaging   CT CAP w contrast at [facility] 10/31/16 Impression: 1.Interval decrease in the size of the ill-defined pancreatic head mass. There is short segment abutment of the portal vein/superior mesenteric vein but no evidence of venous distortion. 2.No evidence of metastatic disease in the chest, abdomen and pelvis.   11/17/2016 -  Hospital Admission   Patient presents to hospital for nausea and vomitting   11/17/2016 Surgery   pancreatecomy, promximal subtotal with total duodenectomy, partial  GASTRECTOMY, CHOLEDOCHOENTEROSTOMY AND GASTROJEJUNOSTOMY (WHIPPLE-TYPE PROCEDURE); WITH PANCREATOJEJUNOSTOMY   01/19/2017 - 03/02/2017 Chemotherapy   Gemcitabine monotherapy: 1000mg /m2, d1,8 Q21d x2 cycles planned    05/18/2017 Imaging   CT CAP at [facility] 05/18/17 Impression: 1.Status post Whipple  with soft tissue density just posterior to the SMA, which may be postsurgical, lymph node, or less likely recurrent disease. Recommend attention on follow-up 2.No  evidence of metastatic disease in the chest, abdomen, and pelvis.     09/20/2017 Imaging   CT CAP at [facility] 09/20/17 Impression: 1. Unchanged subcentimeter soft tissue nodule in the Whipple surgical bed, likely a small lymph node. 2. No evidence of metastatic disease in the chest, abdomen, or pelvis.   01/17/2018 Imaging   01/17/2018 CT CAP Impression: No evidence of recurrent or metastatic disease.   06/13/2018 Imaging   CT CAP W Contrast at [place] on 06/13/18 Impression:  1. Status post Whipple procedure with stable findings at the surgical bed. No convincing evidence of recurrent or metastatic disease within the chest, abdomen, or pelvis.  Electronically Reviewed by:  Gaye Pollack, MD, [facility] Electronically Reviewed on:  06/13/2018 12:15 PM  I have reviewed the images and concur with the above findings.   11/08/2018 Imaging   CT CAP W Contrast  IMPRESSION: 1. No findings of active malignancy. Prior Whipple procedure. Biliary stent in place. 2. Other imaging findings of potential clinical significance: Thoracic scoliosis. Aortic Atherosclerosis (ICD10-I70.0). Multilevel lumbar impingement.     02/06/2019 Imaging   CT CAP W Contrast IMPRESSION: 1. Status post Whipple procedure with no definitive findings to suggest metastatic disease in the chest, abdomen or pelvis. 2. Stable tiny pulmonary nodules in the right upper lobe compared to prior examinations, strongly favored to be benign. 3. Aortic atherosclerosis, in addition to left main and 2 vessel coronary artery disease. Please note that although the presence of coronary artery calcium documents the presence of coronary artery disease, the severity of this disease and any potential stenosis cannot be assessed on this non-gated CT examination. Assessment for potential risk factor modification, dietary therapy or pharmacologic therapy may be warranted, if clinically indicated. 4. Additional incidental findings, as  above.   04/2019 Procedure   He had PAC removed in 04/2019   08/14/2019 Imaging   CT CAP at [facility] 08/14/19 IMPRESSION: 1.  History of pancreatic adenocarcinoma status post Whipple with stable findings at the surgical bed. 2.  No evidence of metastatic disease in the chest, abdomen, or pelvis. 3.  Arterial phase hyperenhancing 9 mm lesion in subcapsular portion of hepatic segment 8, favor a flash filling hemangioma. Attention on follow-up.   09/04/2019 PET scan   IMPRESSION: 1. Post Whipple procedure without evidence of local pancreatic carcinoma recurrence. 2. No evidence metastatic disease in the liver or periportal lymph nodes. 3. No distant metastatic disease.   11/11/2019 Imaging   CT CAP Impression:   1. Stable appearance of arterially enhancing 9 mm lesion within segment 8  of the liver. Continued attention on follow-up.  2. No evidence of metastatic disease within the chest, abdomen, or pelvis.  3. Stable appearance of soft tissue density around the common hepatic  artery, for example series 8, image 44. This may represent post-surgical  changes. Continued attention on follow-up.    02/10/2020 Imaging   CT CAP  Impression:  Similar appearance of ill-defined soft tissue surrounding the common  hepatic artery, favored to be postsurgical. Attention on follow-up.   No definite evidence of metastatic disease.   Previously identified arterially enhancing 9 mm lesion in segment 8 of the  liver not well evaluated on today's study due to lack of arterial phase  imaging, possibly flash-filling hemangioma. Recommend attention on  follow-up.    04/14/2020 Tumor Marker  CA 19-9 at 1124   04/26/2020 Imaging   PET  IMPRESSION: Status post Whipple procedure.   Soft tissue fullness in the porta hepatis with associated mild hypermetabolism, worrisome for recurrence.   No evidence of distant metastases.   05/27/2020 Procedure   EUS by Dr Ardis Hughs  IMPRESSION - I was unable to  visualize the region of the porta hepatitis (where the PET avid soft tissue was noted) due to his surgically altered anatomy    06/04/2020 Pathology Results    Liver lesion biopsy  Fine Needle Aspiration by Dr Mariah Milling at [place] bland, hypocellular soft tissue fragments.    See Comment.    Comment:  The patient's prior surgical pathology specimen [id number]) was reviewed. Evidence of adenocarcinoma is absent in this specimen.  Evaluation is limited by scant cellularity.   A repeat tissue sampling procedure may be helpful, if clinically indicated   08/31/2020 Imaging   CT CAP at [facility]  Impression:   1. Increased recurrent soft tissue along the celiac trunk and common and  proper hepatic arteries with unchanged associated occlusion of the  intrahepatic left portal vein and occlusion of the accessory left hepatic  artery. Increased associated narrowing of the common and proper hepatic  arteries.  2. Increased intrahepatic biliary duct dilation likely due to  aforementioned malignant soft tissue tracking into the liver hilum. Suggest  correlation with lab values for biliary obstruction.    08/31/2020 Imaging   MRI abdomen  Impression:   1. No parenchymal abnormality in the previously identified area of concern  in the left hepatic lobe.  2. Similar-appearing atrophy of the left hepatic lobe with mildly increased  intrahepatic bile duct dilation.  3. Multifocal areas of arterial enhancement in the liver. At least 2 of  these demonstrate uptake on the hepatobiliary phase. Findings could  represent a combination of focal perfusion anomalies and FNH.  4. Ill-defined soft tissue surrounding the celiac trunk is better assessed  on same day CT.    08/31/2020 Imaging   MRI at [facility]  Impression:   1. No parenchymal abnormality in the previously identified area of concern  in the left hepatic lobe.  2. Similar-appearing atrophy of the left hepatic lobe with mildly increased   intrahepatic bile duct dilation.  3. Multifocal areas of arterial enhancement in the liver. At least 2 of  these demonstrate uptake on the hepatobiliary phase. Findings could  represent a combination of focal perfusion anomalies and FNH.  4. Ill-defined soft tissue surrounding the celiac trunk is better assessed  on same day CT.     08/31/2020 Imaging   CT CAP at [facility]  Impression:   1. Increased recurrent soft tissue along the celiac trunk and common and  proper hepatic arteries with unchanged associated occlusion of the  intrahepatic left portal vein and occlusion of the accessory left hepatic  artery. Increased associated narrowing of the common and proper hepatic  arteries.  2. Increased intrahepatic biliary duct dilation likely due to  aforementioned malignant soft tissue tracking into the liver hilum. Suggest  correlation with lab values for biliary obstruction.    09/24/2020 Pathology Results   Soft tissue around celiac artery by [facility]  Diagnostic Interpretation A  SUSPICIOUS FOR MALIGNANCY.      Comment: The needle core biopsy demonstrates rare, bland glands within fibrovascular tissue with chronic inflammation. Immunohistochemical stains performed on block A1 in an effort to further characterize the pathologic process show these rare glands to be positive  for BerEP4, MOC-31, CK19 and CA 19.9, and negative for CDX-2 and calretinin. The patient's prior pancreatoduodenectomy specimen was also reviewed. Findings are suspicious for involvement by the patient's known pancreaticobiliary primary, but not definitive due to scant cellularity. Multiple step sections were examined in the evaluation of this case. This case was also reviewed at an intradepartmental conference on 09/29/20 and Drs. Delmer Islam, Ninfa Linden, Avani Pendse and Apolonio Schneiders Factor concur.   11/06/2020 Imaging   CT CAP  IMPRESSION: 1. Cluster of bilomas/abscess in the posterior right lobe liver with the largest component  measuring up to 4.6 cm. Bile duct dilatation is similar to recent staging scan 08/31/2020. 2. Whipple procedure for pancreas carcinoma with infiltrative soft tissue about the narrowed celiac branches and hepatic hilum, reference dedicated staging scan at [facility] 08/31/2020. 3. No CT findings to correlate with history of pyelonephritis. 4. Airspace disease in the bilateral lower lobes compatible with pneumonia or aspiration. Notable subpleural involvement in the left lower lobe is likely consolidation given the overall pattern but please have low threshold for lower extremity Doppler ultrasound given recent right femur fracture repair. 5. Moderate stool distended rectum with mesorectal edema.     11/07/2020 Imaging   MRI Brain  IMPRESSION: 1. Age indeterminate, mixed left subarachnoid and subdural hematoma measuring 10 mm in thickness. Head CT recommended for better temporal characterization. 2. Areas of suspected acute ischemia in the left temporal and parietal lobes, though diffusion-weighted imaging is complicated by the adjacent blood products and their magnetic susceptibility effects.   11/19/2020 Pathology Results   Clinical History: None provided  Specimen Submitted:  A. PLEURAL FLUID, RIGHT, THORACENTESIS:    FINAL MICROSCOPIC DIAGNOSIS:  - No malignant cells identified    11/25/2020 Imaging   CT AP IMPRESSION: 1. Findings of empyema in the RIGHT chest with associated airspace disease. This has developed since prior imaging of April 9th of 2022 but is decreased in size based on comparison with chest x-ray from November 18, 2020 following aspiration. 2. Hepatic abscesses and or biloma, post drainage now resolved. 3. Post Whipple procedure with soft tissue about the celiac axis and common hepatic artery. Similar appearance of atrophy of the LEFT hepatic lobe. Findings are suspicious for disease recurrence with chronic occlusion of LEFT portal vein and extensive  involvement of the celiac axis. 4. Post RIGHT femoral ORIF, recent postoperative changes with mild surrounding stranding, not unexpected and not significantly changed.   12/10/2020 Imaging   CT AP IMPRESSION: 1. Subtle/questionable edema within the medial cortex of the RIGHT kidney, and new fluid stranding overlying the upper pole of the RIGHT kidney. This may represent mild/early pyelonephritis. Recommend correlation with urinalysis. 2. Complex/fluid collection at the RIGHT lung base is partially imaged, more completely imaged on earlier abdomen CT of 11/25/2020, compatible with previously described empyema. Consider complete characterization with chest CT. 3. No other acute or significant findings within the abdomen or pelvis. No bowel obstruction or evidence of active bowel wall thickening/inflammation. No abscess collection or free intraperitoneal air. 4. Previously placed drainage catheters within the posterior RIGHT liver lobe have been removed. No recurrent abscess.   Aortic Atherosclerosis (ICD10-I70.0).      CURRENT THERAPY:  Surveillance  INTERVAL HISTORY: *** MOROCCO EISENBEIS is here for a follow up. He was last seen by me 09/30/20. He presents to the clinic alone.    REVIEW OF SYSTEMS:  *** Constitutional: Denies fevers, chills or abnormal weight loss Eyes: Denies blurriness of vision Ears, nose, mouth, throat,  and face: Denies mucositis or sore throat Respiratory: Denies cough, dyspnea or wheezes Cardiovascular: Denies palpitation, chest discomfort or lower extremity swelling Gastrointestinal:  Denies nausea, heartburn or change in bowel habits Skin: Denies abnormal skin rashes Lymphatics: Denies new lymphadenopathy or easy bruising Neurological:Denies numbness, tingling or new weaknesses Behavioral/Psych: Mood is stable, no new changes  All other systems were reviewed with the patient and are negative.  MEDICAL HISTORY:  Past Medical History:  Diagnosis  Date  . Arthritis    left hand  . Bronchitis 1977  . Cancer ([place]) 03/09/2016   pancreatic cancer, sees Dr. Cristino Martes at [facility]   . Depression    takes Cymbalta daily  . Diabetes mellitus type II    sees Dr. Chalmers Cater   . GERD (gastroesophageal reflux disease)    takes Omeprazole daily  . H/O hiatal hernia   . Hyperlipidemia    takes Zocor daily  . Hypertension    takes Amlodipine daily  . Hypoglycemia 06/18/2017  . Neck pain    C4-7 stenosis and herniated disc  . Neuromuscular disorder ([place])    hiatal hernia  . Scoliosis    slight  . Spinal cord injury, C5-C7 ([place])    c4-c7  . Stiffness of hand joint    d/t cervical issues    SURGICAL HISTORY: Past Surgical History:  Procedure Laterality Date  . ANTERIOR CERVICAL DECOMP/DISCECTOMY FUSION  08/18/2011   Procedure: ANTERIOR CERVICAL DECOMPRESSION/DISCECTOMY FUSION 3 LEVELS;  Surgeon: Winfield Cunas, MD;  Location: [place];  Service: Neurosurgery;  Laterality: N/A;  Anterior Cervical Four-Five/Five-Six/Six-Seven Decompression with Fusion, Plating, and Bonegraft  . BURR HOLE Left 11/09/2020   Procedure: LEFT BURR HOLES FOR EVACUATION OF Subdural Hematoma;  Surgeon: Judith Part, MD;  Location: [facility];  Service: Neurosurgery;  Laterality: Left;  . CARPAL TUNNEL RELEASE  2013   bilateral, per Dr. Christella Noa   . COLONOSCOPY  10-30-14   per Dr. Olevia Perches, clear, repeat in 10 yrs   . egd with esophageal dilation  9-08   per Dr. Olevia Perches  . ERCP N/A 03/01/2016   Procedure: ENDOSCOPIC RETROGRADE CHOLANGIOPANCREATOGRAPHY (ERCP) with brushings and stent;  Surgeon: Doran Stabler, MD;  Location: [facility];  Service: Endoscopy;  Laterality: N/A;  . ESOPHAGOGASTRODUODENOSCOPY (EGD) WITH PROPOFOL N/A 05/27/2020   Procedure: ESOPHAGOGASTRODUODENOSCOPY (EGD) WITH PROPOFOL;  Surgeon: Milus Banister, MD;  Location: [facility];  Service: Endoscopy;  Laterality: N/A;  . EUS N/A 03/09/2016   Procedure: ESOPHAGEAL ENDOSCOPIC ULTRASOUND (EUS)  RADIAL;  Surgeon: Milus Banister, MD;  Location: [facility];  Service: Endoscopy;  Laterality: N/A;  . EUS N/A 05/27/2020   Procedure: UPPER ENDOSCOPIC ULTRASOUND (EUS) RADIAL;  Surgeon: Milus Banister, MD;  Location: [facility];  Service: Endoscopy;  Laterality: N/A;  . FEMUR IM NAIL Right 10/25/2020   Procedure: INTRAMEDULLARY (IM) NAIL FEMORAL;  Surgeon: Rod Can, MD;  Location: [facility];  Service: Orthopedics;  Laterality: Right;  . IR THORACENTESIS ASP PLEURAL SPACE W/IMG GUIDE  11/19/2020  . lymph nodes biopsy    . melanoma rt calf  1999  . PORT-A-CATH REMOVAL N/A 04/03/2019   Procedure: PORT REMOVAL;  Surgeon: Stark Klein, MD;  Location: [place];  Service: General;  Laterality: N/A;  . PORTACATH PLACEMENT Left 03/22/2016   Procedure: INSERTION PORT-A-CATH;  Surgeon: Stark Klein, MD;  Location: [facility];  Service: General;  Laterality: Left;  . SPINE SURGERY    . TONSILLECTOMY     as a child  .  ULNAR TUNNEL RELEASE  2013   right arm, per Dr. Christella Noa   . UPPER GASTROINTESTINAL ENDOSCOPY    . WHIPPLE PROCEDURE N/A 09/19/2016   Procedure: DIAGNOSTIC LAPAROSCOPY, LAPAROSCOPIC LIVER BIOPSY, RETROPERITONEAL EXPLORATION, INTRAOPERATIVE ULTRASOUND;  Surgeon: Stark Klein, MD;  Location: [facility];  Service: General;  Laterality: N/A;    I have reviewed the social history and family history with the patient and they are unchanged from previous note.  ALLERGIES:  has No Known Allergies.  MEDICATIONS:  No current facility-administered medications for this visit.   No current outpatient medications on file.   Facility-Administered Medications Ordered in Other Visits  Medication Dose Route Frequency Provider Last Rate Last Admin  . acetaminophen (TYLENOL) tablet 650 mg  650 mg Oral Q6H PRN Norval Morton, MD       Or  . acetaminophen (TYLENOL) suppository 650 mg  650 mg Rectal Q6H PRN Smith, Rondell A, MD      . albuterol (PROVENTIL) (2.5 MG/3ML) 0.083% nebulizer solution 2.5 mg  2.5  mg Nebulization Q6H PRN Smith, Rondell A, MD      . cefTRIAXone (ROCEPHIN) 2 g in sodium chloride 0.9 % 100 mL IVPB  2 g Intravenous Q12H Llana Aliment, RPH 200 mL/hr at 12/17/20 0903 2 g at 12/17/20 0903  . Chlorhexidine Gluconate Cloth 2 % PADS 6 each  6 each Topical Daily Norval Morton, MD   6 each at 12/17/20 0902  . citalopram (CELEXA) tablet 20 mg  20 mg Oral Daily Tamala Julian, Rondell A, MD   20 mg at 12/17/20 0901  . diclofenac Sodium (VOLTAREN) 1 % topical gel 2 g  2 g Topical QID Fuller Plan A, MD   2 g at 12/17/20 0902  . enoxaparin (LOVENOX) injection 40 mg  40 mg Subcutaneous Q24H Smith, Rondell A, MD   40 mg at 12/16/20 1726  . hydrocerin (EUCERIN) cream 1 application  1 application Topical BID Norval Morton, MD   1 application at 84/16/60 0902  . HYDROcodone-acetaminophen (NORCO/VICODIN) 5-325 MG per tablet 1 tablet  1 tablet Oral Q3H PRN Fuller Plan A, MD      . hydrocortisone cream 1 %   Topical BID Norval Morton, MD   Given at 12/17/20 0902  . insulin aspart (novoLOG) injection 0-9 Units  0-9 Units Subcutaneous TID WC Norval Morton, MD   1 Units at 12/17/20 1208  . insulin aspart (novoLOG) injection 3 Units  3 Units Subcutaneous TID WC Dessa Phi, DO      . insulin detemir (LEVEMIR) injection 10 Units  10 Units Subcutaneous Daily Dessa Phi, DO   10 Units at 12/17/20 1209  . levETIRAcetam (KEPPRA) tablet 1,000 mg  1,000 mg Oral BID Fuller Plan A, MD   1,000 mg at 12/17/20 0901  . lidocaine (LIDODERM) 5 % 1 patch  1 patch Transdermal Q24H Fuller Plan A, MD   1 patch at 12/16/20 2243  . lipase/protease/amylase (CREON) capsule 24,000 Units  24,000 Units Oral TID AC Norval Morton, MD   24,000 Units at 12/17/20 1207  . loperamide (IMODIUM) capsule 2 mg  2 mg Oral Daily PRN Fuller Plan A, MD      . metroNIDAZOLE (FLAGYL) tablet 500 mg  500 mg Oral Q8H Dessa Phi, DO   500 mg at 12/17/20 0901  . midodrine (PROAMATINE) tablet 5 mg  5 mg Oral TID WC  Smith, Rondell A, MD   5 mg at 12/17/20 1208  . ondansetron (ZOFRAN)  tablet 4 mg  4 mg Oral Q6H PRN Fuller Plan A, MD       Or  . ondansetron (ZOFRAN) injection 4 mg  4 mg Intravenous Q6H PRN Smith, Rondell A, MD      . pantoprazole (PROTONIX) EC tablet 40 mg  40 mg Oral Daily Tamala Julian, Rondell A, MD   40 mg at 12/17/20 0902  . potassium chloride SA (KLOR-CON) CR tablet 40 mEq  40 mEq Oral Q4H Dessa Phi, DO   40 mEq at 12/17/20 1207  . sodium chloride flush (NS) 0.9 % injection 3 mL  3 mL Intravenous Q12H Smith, Rondell A, MD   3 mL at 12/17/20 0902  . tamsulosin (FLOMAX) capsule 0.8 mg  0.8 mg Oral Daily Tamala Julian, Rondell A, MD   0.8 mg at 12/17/20 0901    PHYSICAL EXAMINATION: ECOG PERFORMANCE STATUS: {CHL ONC ECOG [phone]}  There were no vitals filed for this visit. There were no vitals filed for this visit. *** GENERAL:alert, no distress and comfortable SKIN: skin color, texture, turgor are normal, no rashes or significant lesions EYES: normal, Conjunctiva are pink and non-injected, sclera clear {OROPHARYNX:no exudate, no erythema and lips, buccal mucosa, and tongue normal}  NECK: supple, thyroid normal size, non-tender, without nodularity LYMPH:  no palpable lymphadenopathy in the cervical, axillary {or inguinal} LUNGS: clear to auscultation and percussion with normal breathing effort HEART: regular rate & rhythm and no murmurs and no lower extremity edema ABDOMEN:abdomen soft, non-tender and normal bowel sounds Musculoskeletal:no cyanosis of digits and no clubbing  NEURO: alert & oriented x 3 with fluent speech, no focal motor/sensory deficits  LABORATORY DATA:  I have reviewed the data as listed CBC Latest Ref Rng & Units 12/17/2020 12/16/2020 12/15/2020  WBC 4.0 - 10.5 K/uL 11.3(H) 14.7(H) -  Hemoglobin 13.0 - 17.0 g/dL 10.0(L) 9.8(L) 11.6(L)  Hematocrit 39.0 - 52.0 % 31.6(L) 30.4(L) 34.0(L)  Platelets 150 - 400 K/uL 351 342 -     CMP Latest Ref Rng & Units  12/17/2020 12/16/2020 12/15/2020  Glucose 70 - 99 mg/dL 70 286(H) 84  BUN 8 - 23 mg/dL 5(L) 7(L) 11  Creatinine 0.61 - 1.24 mg/dL 0.37(L) 0.59(L) 0.30(L)  Sodium 135 - 145 mmol/L 136 135 140  Potassium 3.5 - 5.1 mmol/L 2.9(L) 3.5 2.7(LL)  Chloride 98 - 111 mmol/L 102 100 98  CO2 22 - 32 mmol/L 30 27 -  Calcium 8.9 - 10.3 mg/dL 8.3(L) 8.4(L) -  Total Protein 6.5 - 8.1 g/dL - - -  Total Bilirubin 0.3 - 1.2 mg/dL - - -  Alkaline Phos 38 - 126 U/L - - -  AST 15 - 41 U/L - - -  ALT 0 - 44 U/L - - -      RADIOGRAPHIC STUDIES: I have personally reviewed the radiological images as listed and agreed with the findings in the report. CT CHEST WO CONTRAST  Result Date: 12/16/2020 CLINICAL DATA:  Evaluate empyema EXAM: CT CHEST WITHOUT CONTRAST TECHNIQUE: Multidetector CT imaging of the chest was performed following the standard protocol without IV contrast. COMPARISON:  CT of the abdomen and pelvis from 12/11/2018, chest x-ray from the previous day. FINDINGS: Cardiovascular: Somewhat limited due to lack of IV contrast. Aortic calcifications are noted. Coronary calcifications are seen. Right-sided PICC line is noted in satisfactory position. Mediastinum/Nodes: Thoracic inlet is within normal limits. The esophagus as visualized is within normal limits. No sizable hilar or mediastinal adenopathy is noted. Lungs/Pleura: Lungs are well aerated bilaterally. The left lung  shows only minimal scarring in the base. Right lung shows some lower lobe atelectasis which is increased slightly in the interval from the prior CT of 12/10/2020. Additionally, small right-sided pleural effusion is noted which is increased slightly in the interval from the prior exam. It has some thickened margins suggestive of empyema. The thickened margins suggest underlying empyema. A small focus of air is noted within which may be related to prior thoracentesis. No pneumothorax is seen. Upper Abdomen: Postsurgical changes are noted in the  stomach. No other focal abnormality is noted in the upper abdomen. Previously seen fluid collection in the right liver posteriorly is not well appreciated on today's exam. Musculoskeletal: Degenerative changes of the thoracic spine are noted. No acute rib abnormality is seen. Postsurgical changes in the cervical spine are noted. No compression deformities are seen. IMPRESSION: Small fluid collection in the posterior aspect of the right pleural space with thickened margins most consistent with a small empyema. A small focus of air is noted within likely related to prior intervention. Slight increase in lower lobe atelectasis on the right when compared with the prior exam. No other acute abnormality is noted. Aortic Atherosclerosis (ICD10-I70.0). Electronically Signed   By: Inez Catalina M.D.   On: 12/16/2020 15:43     ASSESSMENT & PLAN:  Colton Bush is a 65 y.o. male with   1. Primary pancreatic adenocarcinoma, in pancreatic head, cT2N1M0, stage IIB, ypT2N0M0.Probable local recurrence in 08/2020 -He was diagnosed in 02/2016. He is s/p neoadjuvant FOLFIRINOX, concurrentchemoRT with Xeloda, [facility].He is currently on surveillance. -His 01/2020 CT scan with [facility] showed stable soft tissue at the surgical bed, no other evidence of metastasis. -[facility], overall concerning for cancer recurrence.Ca 19-9 at 1125 on 04/14/20. -HisPET from 04/26/20 showedhypermetabolicuptake atthe porta hepatis surgical site which is suspicious for local cancer recurrence. No evidence of distant metastasis. He remains asymptomatic. -Dr Ardis Hughs was unable to obtain a biopsy with EUS on 05/27/20.He was seen by Dr Mariah Milling at [facility] underwent EUS biopsyon 06/04/20, path was negative. -His 08/31/20 CT and MRI at [facility] showed increased soft tissue at surgical bed, no evidence of distant metastasis.  -He underwent intragastric biopsy of the  soft tissue around the celiac artery at [facility] on September 24, 2020.  I reviewed the pathology report, and discussed with the pathologist Dr. Maida Sale.  Due to the limited sample, scant cells, it is not definitive, but suspicious for metastatic adenocarcinoma.  The immunostain consistent with pancreatobiliary origin. -I previously discussed with Dr. Mariah Milling, will does not think he can offer surgery to completely resectable local recurrence. -I recommend radiation for his probable local recurrence, based on his rising CA19.9, CT scan findings and the recent biopsy result.  I also discussed the option of systemic chemotherapy.  Also post radiation and systemic therapy are likely palliative, not curative, I do think radiation has better chance for local disease control, and overall has less side effects, given he is asymptomatic with good quality of life now. -Patient agrees with the plan.  I will reach out to Dr. Mariah Milling and Dr. [name] to get their opinion.  Patient underwent neoadjuvant radiation about 4 years ago.  If he is not a candidate for second round of radiation, I will reach out to to [facility] rad/onc to see if they can offer proton radiation  -f/u open for now   2. Type 2 DM, HTN -He will continue medication and follow  up with his primary care physician Dr. Sarajane Jews  3. Chronic Diarrhea  -Secondary to whipple surgery  -Well controlled/normal on Creon, will continue.Stable.  Plan -path reviewed, I spoke with pathologist Dr. Maida Sale at [facility]  -I recommend radiation for his local recurrence, will refer him to Dr. [name] -copy Dr. Mariah Milling    No problem-specific Assessment & Plan notes found for this encounter.   No orders of the defined types were placed in this encounter.  All questions were answered. The patient knows to call the clinic with any problems, questions or concerns. No barriers to learning was detected. The total time spent in the appointment was {CHL ONC TIME VISIT -  [id number].     Joslyn Devon 12/17/2020   Oneal Deputy, am acting as scribe for Truitt Merle, MD.   {Add scribe attestation statement}

## 2020-12-17 NOTE — TOC Initial Note (Signed)
Transition of Care [facility]) - Initial/Assessment Note    Patient Details  Name: Colton Bush MRN: [id number] Date of Birth: 04-30-1956  Transition [facility] Care [facility]) CM/SW Contact:    Coralee Pesa, Valinda Phone Number: 12/17/2020, 5:11 PM  Clinical Narrative:                 CSW spoke with pt via phone to discuss DC recommendations. Pt notes that he has been to CIR in the past, and is agreeable to SNF now. He has no preference and agrees to a faxout to get choices. He has had both vaccines and a booster. He noted his wife can be contacted to discuss and DC questions. VM was left for wife. CSW will complete workup and faxout for pt. SW will continue to follow for DC needs.  Expected Discharge Plan: Skilled Nursing Facility Barriers to Discharge: Continued Medical Work up,SNF Pending bed offer   Patient Goals and CMS Choice Patient states their goals for this hospitalization and ongoing recovery are:: Pt states he would like to go to SNF to get stronger, would like to return home. CMS Medicare.gov Compare Post Acute Care list provided to:: Patient Choice offered to / list presented to : Patient  Expected Discharge Plan and Services Expected Discharge Plan: [place] Acute Care Choice: [place] arrangements for the past 2 months: Single Family Home                                      Prior Living Arrangements/Services Living arrangements for the past 2 months: Single Family Home Lives with:: Spouse Patient language and need for interpreter reviewed:: Yes Do you feel safe going back to the place where you live?: Yes      Need for Family Participation in Patient Care: Yes (Comment) Care giver support system in place?: Yes (comment)   Criminal Activity/Legal Involvement Pertinent to Current Situation/Hospitalization: No - Comment as needed  Activities of Daily Living Home Assistive Devices/Equipment: Wheelchair,Walker (specify  type) ADL Screening (condition at time of admission) Patient's cognitive ability adequate to safely complete daily activities?: Yes Is the patient deaf or have difficulty hearing?: No Does the patient have difficulty seeing, even when wearing glasses/contacts?: No Does the patient have difficulty concentrating, remembering, or making decisions?: No Patient able to express need for assistance with ADLs?: Yes Does the patient have difficulty dressing or bathing?: Yes Independently performs ADLs?: No Communication: Independent Dressing (OT): Needs assistance Grooming: Independent Feeding: Independent Does the patient have difficulty walking or climbing stairs?: Yes Weakness of Legs: Both Weakness of Arms/Hands: None  Permission Sought/Granted Permission sought to share information with : Family Supports Permission granted to share information with : Yes, Verbal Permission Granted  Share Information with NAME: Javanni Maring     Permission granted to share info w Relationship: Spouse  Permission granted to share info w Contact Information: [phone]  Emotional Assessment Appearance:: Appears stated age Attitude/Demeanor/Rapport: Engaged Affect (typically observed): Appropriate Orientation: : Oriented to Self,Oriented to Place,Oriented to  Time,Oriented to Situation Alcohol / Substance Use: Not Applicable Psych Involvement: No (comment)  Admission diagnosis:  Hypokalemia [E87.6] Hypoglycemia [E16.2] Patient Active Problem List   Diagnosis Date Noted  . Anemia of chronic illness 12/09/2020  . Hypokalemia 12/09/2020  . Nausea & vomiting   . Pain   . Bladder outlet obstruction 11/12/2020  .  Toxic encephalopathy 11/12/2020  . Atrophic pancreas 11/12/2020  . Empyema lung ([place])   . Subdural hematoma ([place])   . Pneumonia of both lower lobes due to infectious organism 11/06/2020  . Liver abscess 11/06/2020  . Sepsis ([place]) 11/05/2020  . Uncontrolled type 2 diabetes mellitus with  ketoacidosis without coma, with long-term current use of insulin ([place]) 11/05/2020  . Increased anion gap metabolic acidosis 87/56/4332  . Chronic diastolic CHF (congestive heart failure) ([place]) 11/05/2020  . Closed right hip fracture, initial encounter ([place]) 10/18/2020  . Fall from ground level 10/18/2020  . Hypoglycemia 06/19/2017  . Hypothermia 06/19/2017  . Goals of care, counseling/discussion 09/29/2016  . Port catheter in place 04/11/2016  . Hypercalcemia 03/29/2016  . Adenocarcinoma of head of pancreas ([place]) 03/17/2016  . Biliary obstruction   . Obstructive jaundice due to malignant neoplasm ([place]) 02/27/2016  . DKA (diabetic ketoacidosis) ([place]) 02/27/2016  . Mixed diabetic hyperlipidemia associated with type 2 diabetes mellitus ([place]) 05/05/2014  . Cervical spondylosis with myelopathy 08/18/2011  . CERUMEN IMPACTION 12/14/2008  . Diabetes ([place]) 09/16/2007  . Hyperlipidemia, mixed 09/16/2007  . Essential hypertension 09/16/2007  . GERD without esophagitis 09/16/2007  . ESOPHAGEAL STRICTURE 04/04/2007  . HIATAL HERNIA 04/04/2007   PCP:  [name], MD Pharmacy:   [id number] - [place], [place]. AT [address] [place] [address] Phone: [phone] Fax: [phone]     Social Determinants of Health (SDOH) Interventions    Readmission Risk Interventions No flowsheet data found.

## 2020-12-17 NOTE — Progress Notes (Signed)
Colton Bush   DOB:August 20, 1955   JX#:[id number]   OZH#:[id number]  Oncology follow up   Subjective: Patient is well-known to me, under my care for his pancreatic cancer.  He was recently discharged from rehab after a prolonged hospital stay for sepsis, liver abscess, empyema and subdural hematoma after fall.  He had a multiple ED visit after discharge, and his wife has had a lot of difficulty to take care of him at home.  He was brought to hospital at this time for hypoglycemia.  He is very fatigued, depressed.  Denies significant pain, appetite is okay.   Objective:  Vitals:   12/17/20 1400 12/17/20 1700  BP: (!) 152/82 (!) 164/80  Pulse: 77 67  Resp: 18 13  Temp:    SpO2: 96% 97%    Body mass index is 22.54 kg/m.  Intake/Output Summary (Last 24 hours) at 12/17/2020 1934 Last data filed at 12/17/2020 1700 Gross per 24 hour  Intake 100 ml  Output 2680 ml  Net -2580 ml     Sclerae unicteric  Oropharynx clear  No peripheral adenopathy  Lungs clear -- no rales or rhonchi  Heart regular rate and rhythm  Abdomen soft and non-tender   MSK no focal spinal tenderness, no peripheral edema  Neuro nonfocal   CBG (last 3)  Recent Labs    12/17/20 0505 12/17/20 1143 12/17/20 1617  GLUCAP 119* 132* 239*     Labs:  Urine Studies No results for input(s): UHGB, CRYS in the last 72 hours.  Invalid input(s): UACOL, UAPR, USPG, UPH, UTP, UGL, [place], UBIL, UNIT, UROB, [place], UEPI, UWBC, [place], [place], [place], [place], [place]  Basic Metabolic Panel: Recent Labs  Lab 12/13/20 2015 12/15/20 1155 12/15/20 1202 12/15/20 1610 12/16/20 0348 12/17/20 0806  NA 135 139 140  --  135 136  K 2.8* 2.6* 2.7*  --  3.5 2.9*  CL 98 101 98  --  100 102  CO2 28 31  --   --  27 30  GLUCOSE 183* 89 84  --  286* 70  BUN 14 10 11   --  7* 5*  CREATININE 0.38* 0.47* 0.30*  --  0.59* 0.37*  CALCIUM 8.3* 8.3*  --   --  8.4* 8.3*  MG  --   --   --  2.3  --   --    GFR Estimated Creatinine Clearance: 92.9  mL/min (A) (by C-G formula based on SCr of 0.37 mg/dL (L)). Liver Function Tests: Recent Labs  Lab 12/13/20 2015 12/15/20 1155  AST 46* 21  ALT 34 26  ALKPHOS 1,538* 1,159*  BILITOT 0.4 0.7  PROT 6.8 6.9  ALBUMIN 2.8* 2.7*   No results for input(s): LIPASE, AMYLASE in the last 168 hours. No results for input(s): AMMONIA in the last 168 hours. Coagulation profile Recent Labs  Lab 12/15/20 1155  INR 1.2    CBC: Recent Labs  Lab 12/13/20 2015 12/15/20 1155 12/15/20 1202 12/16/20 0348 12/17/20 0806  WBC 13.5* 11.6*  --  14.7* 11.3*  NEUTROABS 11.1* 10.1*  --   --   --   HGB 9.7* 10.4* 11.6* 9.8* 10.0*  HCT 30.4* 32.7* 34.0* 30.4* 31.6*  MCV 89.9 89.8  --  89.1 90.5  PLT 361 374  --  342 351   Cardiac Enzymes: No results for input(s): CKTOTAL, CKMB, CKMBINDEX, TROPONINI in the last 168 hours. BNP: Invalid input(s): POCBNP CBG: Recent Labs  Lab 12/17/20 0427 12/17/20 0448 12/17/20 0505 12/17/20 1143 12/17/20  [place]   D-Dimer No results for input(s): DDIMER in the last 72 hours. Hgb A1c No results for input(s): HGBA1C in the last 72 hours. Lipid Profile No results for input(s): CHOL, HDL, LDLCALC, TRIG, CHOLHDL, LDLDIRECT in the last 72 hours. Thyroid function studies No results for input(s): TSH, T4TOTAL, T3FREE, THYROIDAB in the last 72 hours.  Invalid input(s): FREET3 Anemia work up No results for input(s): VITAMINB12, FOLATE, FERRITIN, TIBC, IRON, RETICCTPCT in the last 72 hours. Microbiology Recent Results (from the past 240 hour(s))  Resp Panel by RT-PCR (Flu A&B, Covid) Nasopharyngeal Swab     Status: None   Collection Time: 12/10/20  9:30 AM   Specimen: Nasopharyngeal Swab; Nasopharyngeal(NP) swabs in vial transport medium  Result Value Ref Range Status   SARS Coronavirus 2 by RT PCR NEGATIVE NEGATIVE Final    Comment: (NOTE) SARS-CoV-2 target nucleic acids are NOT DETECTED.  The SARS-CoV-2 RNA is generally  detectable in upper respiratory specimens during the acute phase of infection. The lowest concentration of SARS-CoV-2 viral copies this assay can detect is 138 copies/mL. A negative result does not preclude SARS-Cov-2 infection and should not be used as the sole basis for treatment or other patient management decisions. A negative result may occur with  improper specimen collection/handling, submission of specimen other than nasopharyngeal swab, presence of viral mutation(s) within the areas targeted by this assay, and inadequate number of viral copies(<138 copies/mL). A negative result must be combined with clinical observations, patient history, and epidemiological information. The expected result is Negative.  Fact Sheet for Patients:  EntrepreneurPulse.com.au  Fact Sheet for Healthcare Providers:  IncredibleEmployment.be  This test is no t yet approved or cleared by the [place] FDA and  has been authorized for detection and/or diagnosis of SARS-CoV-2 by FDA under an Emergency Use Authorization (EUA). This EUA will remain  in effect (meaning this test can be used) for the duration of the COVID-19 declaration under Section 564(b)(1) of the Act, 21 U.S.C.section 360bbb-3(b)(1), unless the authorization is terminated  or revoked sooner.       Influenza A by PCR NEGATIVE NEGATIVE Final   Influenza B by PCR NEGATIVE NEGATIVE Final    Comment: (NOTE) The Xpert Xpress SARS-CoV-2/FLU/RSV plus assay is intended as an aid in the diagnosis of influenza from Nasopharyngeal swab specimens and should not be used as a sole basis for treatment. Nasal washings and aspirates are unacceptable for Xpert Xpress SARS-CoV-2/FLU/RSV testing.  Fact Sheet for Patients: EntrepreneurPulse.com.au  Fact Sheet for Healthcare Providers: IncredibleEmployment.be  This test is not yet approved or cleared by the [place] FDA  and has been authorized for detection and/or diagnosis of SARS-CoV-2 by FDA under an Emergency Use Authorization (EUA). This EUA will remain in effect (meaning this test can be used) for the duration of the COVID-19 declaration under Section 564(b)(1) of the Act, 21 U.S.C. section 360bbb-3(b)(1), unless the authorization is terminated or revoked.  Performed at [facility], [place] [address], [place], [place] [address]   Resp Panel by RT-PCR (Flu A&B, Covid) Nasopharyngeal Swab     Status: None   Collection Time: 12/15/20 11:52 AM   Specimen: Nasopharyngeal Swab; Nasopharyngeal(NP) swabs in vial transport medium  Result Value Ref Range Status   SARS Coronavirus 2 by RT PCR NEGATIVE NEGATIVE Final    Comment: (NOTE) SARS-CoV-2 target nucleic acids are NOT DETECTED.  The SARS-CoV-2 RNA is generally detectable in upper respiratory specimens during the acute  phase of infection. The lowest concentration of SARS-CoV-2 viral copies this assay can detect is 138 copies/mL. A negative result does not preclude SARS-Cov-2 infection and should not be used as the sole basis for treatment or other patient management decisions. A negative result may occur with  improper specimen collection/handling, submission of specimen other than nasopharyngeal swab, presence of viral mutation(s) within the areas targeted by this assay, and inadequate number of viral copies(<138 copies/mL). A negative result must be combined with clinical observations, patient history, and epidemiological information. The expected result is Negative.  Fact Sheet for Patients:  EntrepreneurPulse.com.au  Fact Sheet for Healthcare Providers:  IncredibleEmployment.be  This test is no t yet approved or cleared by the [place] FDA and  has been authorized for detection and/or diagnosis of SARS-CoV-2 by FDA under an Emergency Use Authorization (EUA). This EUA will remain  in effect  (meaning this test can be used) for the duration of the COVID-19 declaration under Section 564(b)(1) of the Act, 21 U.S.C.section 360bbb-3(b)(1), unless the authorization is terminated  or revoked sooner.       Influenza A by PCR NEGATIVE NEGATIVE Final   Influenza B by PCR NEGATIVE NEGATIVE Final    Comment: (NOTE) The Xpert Xpress SARS-CoV-2/FLU/RSV plus assay is intended as an aid in the diagnosis of influenza from Nasopharyngeal swab specimens and should not be used as a sole basis for treatment. Nasal washings and aspirates are unacceptable for Xpert Xpress SARS-CoV-2/FLU/RSV testing.  Fact Sheet for Patients: EntrepreneurPulse.com.au  Fact Sheet for Healthcare Providers: IncredibleEmployment.be  This test is not yet approved or cleared by the [place] FDA and has been authorized for detection and/or diagnosis of SARS-CoV-2 by FDA under an Emergency Use Authorization (EUA). This EUA will remain in effect (meaning this test can be used) for the duration of the COVID-19 declaration under Section 564(b)(1) of the Act, 21 U.S.C. section 360bbb-3(b)(1), unless the authorization is terminated or revoked.  Performed at [facility], [place] [address] [place], [place] [address]   Culture, blood (routine x 2)     Status: None (Preliminary result)   Collection Time: 12/15/20 12:00 PM   Specimen: BLOOD LEFT HAND  Result Value Ref Range Status   Specimen Description BLOOD LEFT HAND  Final   Special Requests   Final    BOTTLES DRAWN AEROBIC AND ANAEROBIC Blood Culture results may not be optimal due to an inadequate volume of blood received in culture bottles   Culture   Final    NO GROWTH 2 DAYS Performed at [facility], [place] [address], [place], [place] [address]    Report Status PENDING  Incomplete  Culture, blood (routine x 2)     Status: None (Preliminary result)   Collection Time: 12/15/20 12:06 PM   Specimen: BLOOD RIGHT  HAND  Result Value Ref Range Status   Specimen Description BLOOD RIGHT HAND  Final   Special Requests   Final    BOTTLES DRAWN AEROBIC AND ANAEROBIC Blood Culture results may not be optimal due to an inadequate volume of blood received in culture bottles   Culture   Final    NO GROWTH 2 DAYS Performed at [facility], [place] [address], [place], [place] [address]    Report Status PENDING  Incomplete  Culture, Urine     Status: Abnormal   Collection Time: 12/16/20  2:24 PM   Specimen: Urine, Random  Result Value Ref Range Status   Specimen Description URINE,  RANDOM  Final   Special Requests   Final    NONE Performed at [facility], [place] [address] [place], [place] [address]    Culture MULTIPLE SPECIES PRESENT, SUGGEST RECOLLECTION (A)  Final   Report Status 12/17/2020 FINAL  Final      Studies:  CT CHEST WO CONTRAST  Result Date: 12/16/2020 CLINICAL DATA:  Evaluate empyema EXAM: CT CHEST WITHOUT CONTRAST TECHNIQUE: Multidetector CT imaging of the chest was performed following the standard protocol without IV contrast. COMPARISON:  CT of the abdomen and pelvis from 12/11/2018, chest x-ray from the previous day. FINDINGS: Cardiovascular: Somewhat limited due to lack of IV contrast. Aortic calcifications are noted. Coronary calcifications are seen. Right-sided PICC line is noted in satisfactory position. Mediastinum/Nodes: Thoracic inlet is within normal limits. The esophagus as visualized is within normal limits. No sizable hilar or mediastinal adenopathy is noted. Lungs/Pleura: Lungs are well aerated bilaterally. The left lung shows only minimal scarring in the base. Right lung shows some lower lobe atelectasis which is increased slightly in the interval from the prior CT of 12/10/2020. Additionally, small right-sided pleural effusion is noted which is increased slightly in the interval from the prior exam. It has some thickened margins suggestive of empyema. The thickened margins  suggest underlying empyema. A small focus of air is noted within which may be related to prior thoracentesis. No pneumothorax is seen. Upper Abdomen: Postsurgical changes are noted in the stomach. No other focal abnormality is noted in the upper abdomen. Previously seen fluid collection in the right liver posteriorly is not well appreciated on today's exam. Musculoskeletal: Degenerative changes of the thoracic spine are noted. No acute rib abnormality is seen. Postsurgical changes in the cervical spine are noted. No compression deformities are seen. IMPRESSION: Small fluid collection in the posterior aspect of the right pleural space with thickened margins most consistent with a small empyema. A small focus of air is noted within likely related to prior intervention. Slight increase in lower lobe atelectasis on the right when compared with the prior exam. No other acute abnormality is noted. Aortic Atherosclerosis (ICD10-I70.0). Electronically Signed   By: Inez Catalina M.D.   On: 12/16/2020 15:43    Assessment: 65 y.o. male   1.  Hyperglycemia secondary to medicine, in the setting of poorly controlled type 2 diabetes 2.  Recent liver abscess and empyema 3.  Recurrent pancreatic cancer, pending radiation therapy 4.  Seizure disorder 5.  Recent subdural hematoma 6.  Recent hip fracture status post surgery in 09/2020 7.  Deconditioning 8. depression   Plan:  -chart reviewed, his infection is controlled and ID plan to change his IV antibiotics to linezolid  -I am concerned about his depression, will ask chaplain to see him today.  I spoke with his nurse -He will likely need nursing home replacement -We will continue to hold radiation for his pancreatic cancer at this point, he is not able to go through radiation treatment due to his medical condition and very poor performance status.  I reassured him that radiation still complained when he gets stronger. -I asked him to call me when he is ready to  leave nursing home, and will schedule his follow-up appointment. -Medical management per primary team.   Truitt Merle, MD 12/17/2020  7:34 PM

## 2020-12-17 NOTE — Consult Note (Signed)
[facility]    Date of Admission:  12/15/2020     Total days of antibiotics 38   Day 38 Ceftriaxone BID + Flagyl PO             Reason for Consult: empyema     Referring Provider: Maylene Roes Primary Care Provider: [name], MD    Assessment: Colton Bush is a 65 y.o. male with multiple medical problems including T2DM, recurrent pancreatic cancer (s/p whipple and chemo/radiation therapy), polymicrobial liver abscess (e coli, strep anginosus + kleb pneumo) and exudative pleural effusion>>empyema (VRE). He has resolved the liver abscess per recent CT scan and nearly completed the full 6 week course to treat for consideration of also possible CNS infection.   No localizing complaints abdomen or head and resolution of liver abscess on imaging.   He describes a light dry cough but no chest pain. On exam he is breathing comfortably on room air and not acutely ill appearing. Mild leukocytosis remains without fever. Blood cultures negative @ 72 hours. Imaging describes a small empyema that remains on the R side. Will plan to restart linezolid with known VRE history as empyema was likely under treated in the setting of inadequate drainage. Would recommend consultation with CT surgery / IR regarding recommendations to drain the pleural space.     Plan: 1. Restart linezolid 600 mg BID PO 2. CT surgery vs IR consultation for empyema drainage  3. OK to stop flagyl and ceftriaxone with resolution of liver abscess.     Principal Problem:   Hypoglycemia Active Problems:   Hyperlipidemia, mixed   Essential hypertension   GERD without esophagitis   Liver abscess   Bladder outlet obstruction   Hypokalemia   . Chlorhexidine Gluconate Cloth  6 each Topical Daily  . citalopram  20 mg Oral Daily  . diclofenac Sodium  2 g Topical QID  . enoxaparin (LOVENOX) injection  40 mg Subcutaneous Q24H  . hydrocerin  1 application Topical BID  . hydrocortisone cream    Topical BID  . insulin aspart  0-9 Units Subcutaneous TID WC  . insulin aspart  3 Units Subcutaneous TID WC  . insulin detemir  10 Units Subcutaneous Daily  . levETIRAcetam  1,000 mg Oral BID  . lidocaine  1 patch Transdermal Q24H  . lipase/protease/amylase  24,000 Units Oral TID AC  . metroNIDAZOLE  500 mg Oral Q8H  . midodrine  5 mg Oral TID WC  . pantoprazole  40 mg Oral Daily  . potassium chloride  40 mEq Oral Q4H  . sodium chloride flush  3 mL Intravenous Q12H  . tamsulosin  0.8 mg Oral Daily    HPI: Colton Bush is a 65 y.o. male admitted on 12/15/2020 from home via EMS to evaluate him being unresponsive.   When I asked Colton Bush why he came to the hospital this time he stated "I don't know, maybe because my blood sugars were low and I was out of it."   He has had multiple hospitalizations over the last 2 months. R hip fracture in March 2022 s/p IM nail that was complicated by E coli UTI, Strep anginosis bacteremia (tx with ceftriaxone >> amox x 7d). Required readmission 11/05/20 for abdominal pain and found to have liver abscess d/t strep anginosis --> treated with percutaneous drainage and IV ceftriaxone and PO Flagyl x 6 weeks scheduled to complete 5/24. Pleural effusion to the R was drainage  and grew out VRE --> treated for 2 weeks. Underwent evacuation of subdural hematoma at one point as well.  Discharged to inpatient rehab 4/15 - 5/11 then to home where Colton Bush has had multiple stressors there. He is requesting consideration for SNF placement as he is very weak and feels he needs more rehab to recover.  Also stressed with the possibility of recurrent pancreatic cancer and "plans being on hold for treatment of this."   He states his PICC line has been functioning normally without any concerns.   Review of Systems: Review of Systems  Constitutional: Negative for chills, fever and malaise/fatigue.  Eyes: Negative for blurred vision and double vision.  Respiratory: Positive for cough.  Negative for sputum production and shortness of breath.   Cardiovascular: Negative for chest pain and leg swelling.  Gastrointestinal: Positive for diarrhea (some loose BMs < 3 daily ). Negative for abdominal pain and vomiting.  Genitourinary: Negative for dysuria and hematuria.  Musculoskeletal: Negative for back pain and myalgias.  Skin: Negative for rash.       Abrasions to R elbow from fall  Neurological: Positive for weakness. Negative for dizziness and headaches.    Past Medical History:  Diagnosis Date  . Arthritis    left hand  . Bronchitis 1977  . Cancer ([place]) 03/09/2016   pancreatic cancer, sees Dr. Cristino Martes at [facility]   . Depression    takes Cymbalta daily  . Diabetes mellitus type II    sees Dr. Chalmers Cater   . GERD (gastroesophageal reflux disease)    takes Omeprazole daily  . H/O hiatal hernia   . Hyperlipidemia    takes Zocor daily  . Hypertension    takes Amlodipine daily  . Hypoglycemia 06/18/2017  . Neck pain    C4-7 stenosis and herniated disc  . Neuromuscular disorder ([place])    hiatal hernia  . Scoliosis    slight  . Spinal cord injury, C5-C7 ([place])    c4-c7  . Stiffness of hand joint    d/t cervical issues    Social History   Tobacco Use  . Smoking status: Never Smoker  . Smokeless tobacco: Never Used  . Tobacco comment: tried a pipe 35 years ago   Vaping Use  . Vaping Use: Never used  Substance Use Topics  . Alcohol use: No    Alcohol/week: 0.0 standard drinks  . Drug use: No    Family History  Problem Relation Age of Onset  . Heart disease Father   . Heart disease Brother 40  . Anesthesia problems Mother   . Heart disease Mother   . Dementia Mother   . Diabetes Sister   . Stroke Sister   . Colon cancer Neg Hx   . Rectal cancer Neg Hx   . Stomach cancer Neg Hx    No Known Allergies  OBJECTIVE: Blood pressure (!) 161/83, pulse 66, temperature 98.3 F (36.8 C), temperature source Oral, resp. rate 17, height 5\' 11"  (1.803 m), weight  73.3 kg, SpO2 97 %.  Physical Exam Vitals reviewed.  Constitutional:      Appearance: He is well-developed. He is not ill-appearing.     Comments: Resting in bed watching TV  HENT:     Mouth/Throat:     Mouth: Mucous membranes are moist.     Dentition: Normal dentition. No dental abscesses.     Pharynx: Oropharynx is clear.  Eyes:     General: No scleral icterus.    Pupils: Pupils are equal,  round, and reactive to light.  Cardiovascular:     Rate and Rhythm: Normal rate and regular rhythm.     Heart sounds: Normal heart sounds.  Pulmonary:     Effort: Pulmonary effort is normal.     Breath sounds: Rales present.     Comments: Diminished R LL Abdominal:     General: There is no distension.     Palpations: Abdomen is soft.     Tenderness: There is no abdominal tenderness.  Musculoskeletal:        General: Normal range of motion.  Lymphadenopathy:     Cervical: No cervical adenopathy.  Skin:    General: Skin is warm and dry.     Capillary Refill: Capillary refill takes less than 2 seconds.     Findings: No rash.     Comments: RUE PICC clean and dry   Neurological:     Mental Status: He is alert and oriented to person, place, and time.  Psychiatric:        Judgment: Judgment normal.     Lab Results Lab Results  Component Value Date   WBC 11.3 (H) 12/17/2020   HGB 10.0 (L) 12/17/2020   HCT 31.6 (L) 12/17/2020   MCV 90.5 12/17/2020   PLT 351 12/17/2020    Lab Results  Component Value Date   CREATININE 0.37 (L) 12/17/2020   BUN 5 (L) 12/17/2020   NA 136 12/17/2020   K 2.9 (L) 12/17/2020   CL 102 12/17/2020   CO2 30 12/17/2020    Lab Results  Component Value Date   ALT 26 12/15/2020   AST 21 12/15/2020   ALKPHOS 1,159 (H) 12/15/2020   BILITOT 0.7 12/15/2020     Microbiology: Recent Results (from the past 240 hour(s))  Resp Panel by RT-PCR (Flu A&B, Covid) Nasopharyngeal Swab     Status: None   Collection Time: 12/10/20  9:30 AM   Specimen:  Nasopharyngeal Swab; Nasopharyngeal(NP) swabs in vial transport medium  Result Value Ref Range Status   SARS Coronavirus 2 by RT PCR NEGATIVE NEGATIVE Final    Comment: (NOTE) SARS-CoV-2 target nucleic acids are NOT DETECTED.  The SARS-CoV-2 RNA is generally detectable in upper respiratory specimens during the acute phase of infection. The lowest concentration of SARS-CoV-2 viral copies this assay can detect is 138 copies/mL. A negative result does not preclude SARS-Cov-2 infection and should not be used as the sole basis for treatment or other patient management decisions. A negative result may occur with  improper specimen collection/handling, submission of specimen other than nasopharyngeal swab, presence of viral mutation(s) within the areas targeted by this assay, and inadequate number of viral copies(<138 copies/mL). A negative result must be combined with clinical observations, patient history, and epidemiological information. The expected result is Negative.  Fact Sheet for Patients:  EntrepreneurPulse.com.au  Fact Sheet for Healthcare Providers:  IncredibleEmployment.be  This test is no t yet approved or cleared by the [place] FDA and  has been authorized for detection and/or diagnosis of SARS-CoV-2 by FDA under an Emergency Use Authorization (EUA). This EUA will remain  in effect (meaning this test can be used) for the duration of the COVID-19 declaration under Section 564(b)(1) of the Act, 21 U.S.C.section 360bbb-3(b)(1), unless the authorization is terminated  or revoked sooner.       Influenza A by PCR NEGATIVE NEGATIVE Final   Influenza B by PCR NEGATIVE NEGATIVE Final    Comment: (NOTE) The Xpert Xpress SARS-CoV-2/FLU/RSV plus assay is intended as  an aid in the diagnosis of influenza from Nasopharyngeal swab specimens and should not be used as a sole basis for treatment. Nasal washings and aspirates are unacceptable for  Xpert Xpress SARS-CoV-2/FLU/RSV testing.  Fact Sheet for Patients: EntrepreneurPulse.com.au  Fact Sheet for Healthcare Providers: IncredibleEmployment.be  This test is not yet approved or cleared by the [place] FDA and has been authorized for detection and/or diagnosis of SARS-CoV-2 by FDA under an Emergency Use Authorization (EUA). This EUA will remain in effect (meaning this test can be used) for the duration of the COVID-19 declaration under Section 564(b)(1) of the Act, 21 U.S.C. section 360bbb-3(b)(1), unless the authorization is terminated or revoked.  Performed at [facility], [place] [address], [place], [place] [address]   Resp Panel by RT-PCR (Flu A&B, Covid) Nasopharyngeal Swab     Status: None   Collection Time: 12/15/20 11:52 AM   Specimen: Nasopharyngeal Swab; Nasopharyngeal(NP) swabs in vial transport medium  Result Value Ref Range Status   SARS Coronavirus 2 by RT PCR NEGATIVE NEGATIVE Final    Comment: (NOTE) SARS-CoV-2 target nucleic acids are NOT DETECTED.  The SARS-CoV-2 RNA is generally detectable in upper respiratory specimens during the acute phase of infection. The lowest concentration of SARS-CoV-2 viral copies this assay can detect is 138 copies/mL. A negative result does not preclude SARS-Cov-2 infection and should not be used as the sole basis for treatment or other patient management decisions. A negative result may occur with  improper specimen collection/handling, submission of specimen other than nasopharyngeal swab, presence of viral mutation(s) within the areas targeted by this assay, and inadequate number of viral copies(<138 copies/mL). A negative result must be combined with clinical observations, patient history, and epidemiological information. The expected result is Negative.  Fact Sheet for Patients:  EntrepreneurPulse.com.au  Fact Sheet for Healthcare Providers:   IncredibleEmployment.be  This test is no t yet approved or cleared by the [place] FDA and  has been authorized for detection and/or diagnosis of SARS-CoV-2 by FDA under an Emergency Use Authorization (EUA). This EUA will remain  in effect (meaning this test can be used) for the duration of the COVID-19 declaration under Section 564(b)(1) of the Act, 21 U.S.C.section 360bbb-3(b)(1), unless the authorization is terminated  or revoked sooner.       Influenza A by PCR NEGATIVE NEGATIVE Final   Influenza B by PCR NEGATIVE NEGATIVE Final    Comment: (NOTE) The Xpert Xpress SARS-CoV-2/FLU/RSV plus assay is intended as an aid in the diagnosis of influenza from Nasopharyngeal swab specimens and should not be used as a sole basis for treatment. Nasal washings and aspirates are unacceptable for Xpert Xpress SARS-CoV-2/FLU/RSV testing.  Fact Sheet for Patients: EntrepreneurPulse.com.au  Fact Sheet for Healthcare Providers: IncredibleEmployment.be  This test is not yet approved or cleared by the [place] FDA and has been authorized for detection and/or diagnosis of SARS-CoV-2 by FDA under an Emergency Use Authorization (EUA). This EUA will remain in effect (meaning this test can be used) for the duration of the COVID-19 declaration under Section 564(b)(1) of the Act, 21 U.S.C. section 360bbb-3(b)(1), unless the authorization is terminated or revoked.  Performed at [facility], [place] [address] [place], [place] [address]   Culture, blood (routine x 2)     Status: None (Preliminary result)   Collection Time: 12/15/20 12:00 PM   Specimen: BLOOD LEFT HAND  Result Value Ref Range Status   Specimen Description BLOOD LEFT HAND  Final   Special  Requests   Final    BOTTLES DRAWN AEROBIC AND ANAEROBIC Blood Culture results may not be optimal due to an inadequate volume of blood received in culture bottles   Culture   Final     NO GROWTH 2 DAYS Performed at [facility], [place] [address], [place], [place] [address]    Report Status PENDING  Incomplete  Culture, blood (routine x 2)     Status: None (Preliminary result)   Collection Time: 12/15/20 12:06 PM   Specimen: BLOOD RIGHT HAND  Result Value Ref Range Status   Specimen Description BLOOD RIGHT HAND  Final   Special Requests   Final    BOTTLES DRAWN AEROBIC AND ANAEROBIC Blood Culture results may not be optimal due to an inadequate volume of blood received in culture bottles   Culture   Final    NO GROWTH 2 DAYS Performed at [facility], [place] [address], [place], [place] [address]    Report Status PENDING  Incomplete  Culture, Urine     Status: Abnormal   Collection Time: 12/16/20  2:24 PM   Specimen: Urine, Random  Result Value Ref Range Status   Specimen Description URINE, RANDOM  Final   Special Requests   Final    NONE Performed at [facility], [place] [address] [place], [place] [address]    Culture MULTIPLE SPECIES PRESENT, SUGGEST RECOLLECTION (A)  Final   Report Status 12/17/2020 FINAL  Final     Janene Madeira, MSN, NP-C [facility] [email or web address] Pager: [phone] Office: [phone] [place]: [phone]

## 2020-12-17 NOTE — Progress Notes (Signed)
PROGRESS NOTE    Colton Bush  DZH:[id number] DOB: Dec 06, 1955 DOA: 12/15/2020 PCP: [name], MD     Brief Narrative:  Colton Bush is a 65 y.o. male with medical history significant of hypertension, hyperlipidemia, recurrent pancreatic cancer who presents after his wife found him unresponsive this morning.  Most of the history is obtained from the patient's wife who is present at bedside.  She states that normally he is up before she is, but this morning he was still in bed.  After little while she went and checked on him and noticed that he was drooling of the side of his mouth and had sonorous respirations.  Denied seeing any seizure-like activity.  Attempted multiple times to wake him and thereafter called 911.  They found his blood sugar to be around 41.  Patient had last taken Levemir approximately 20 units the night before, but did not eat dinner.  Since he had been home from rehab he reportedly had been eating regularly.  However, patient reports that he recently he had just started feeling bad over the last 2 to 3 days.  Wife also reports that over the last 2 to 3 days his blood sugars that intermittently been dropping into the 60-70s at home.  After he was discharged from rehab he reported that they sent him home on MS Contin as well as hydrocodone, but he did not like the way the made him feel and has not really taken it.  Patient just recently been hospitalized for right hip fracture status post intramedullary nail in 09/2020. His hospital course was complicated E. coli UTI along with strep anginosis bacteremia in 1 of 4 blood cultures treated with Rocephin and then amoxicillin for total of 7 days.  Patient was sent to a rehab facility, but subsequently had to be hospitalized from 4/8-4/15 with severe sepsis found to have liver abscesses status post JP drain x2 and right empyema.  During his hospitalization patient was noted to be acutely confused for which imaging studies of the brain  revealed subdural hematomas requiring burr holes.  He was seen by ID and recommended to continue on Rocephin and oral metronidazole to complete a total of 6 weeks (5/24). Patient was noted to have concern for probable seizures for which he was started on Keppra.  Patient was he went to inpatient rehab from 4/15-5/11.  After that he was able to be discharged from rehab he presented back to the emergency department on 5/12 and again on 5/16 for weakness.  He was now hospitalized on 5/18 for hypoglycemia.  New events last 24 hours / Subjective: Recurrent hypoglycemic episode this morning, associated bradycardia.  Resolved at the time of my examination.  Patient denies any new complaints, denies any cough, chest pain, abdominal pain, nausea or vomiting.  He remains afebrile.  Assessment & Plan:   Principal Problem:   Hypoglycemia Active Problems:   Hyperlipidemia, mixed   Essential hypertension   GERD without esophagitis   Liver abscess   Bladder outlet obstruction   Hypokalemia   Hypoglycemia with history of diabetes mellitus type 2, poorly controlled -Initially hypoglycemia had resolved, insulin resumed, recurrent hypoglycemic episode this morning.  Decrease Levemir dosing, continue sliding scale insulin.  Add mealtime NovoLog as long as eating >50% of the meal. -Appreciate diabetic coordinator  Leukocytosis -Blood cultures negative to date -UA negative, urine culture with multiple species -Continue outpatient Rocephin, Flagyl for liver abscesses through 5/24 -CT A/P: Subtle/questionable edema within the medial cortex  of the RIGHT kidney, and new fluid stranding overlying the upper pole of the RIGHT kidney. This may represent mild/early pyelonephritis. Complex/fluid collection at the RIGHT lung base is partially imaged, more completely imaged on earlier abdomen CT of 11/25/2020, compatible with previously described empyema.   Empyema -CT A/P: Complex/fluid collection at the RIGHT lung  base is partially imaged, more completely imaged on earlier abdomen CT of 11/25/2020, compatible with previously described empyema. Consider complete characterization with chest CT. -Chest CT with persistent small right-sided empyema -Consult infectious disease for antibiotic recommendation  Liver abscesses -Has been status post JP drain x2 -Complete outpatient Rocephin, Flagyl through 5/24 -CT A/P: No recurrent abscess  Recurrent pancreatic cancer -History of Whipple procedure status post chemo and radiation -Continue Creon -Follows with Dr. [name]   Hypotension -Continue midodrine  Seizure disorder -Continue Keppra  History of subdural hematoma status post bur hole 4/12 -Repeat CT head 5/16: No significant change in size or appearance of thin left convexity subdural hematoma without significant mass effect or midline shift. No acute interval change since 12/10/2020.  BPH with chronic indwelling Foley catheter -Continue Flomax  Recent right hip fracture status postrepair by intramedullary nail 09/2020 -PT OT recommending SNF placement  Hypokalemia -Replace, trend   In agreement with assessment of the pressure ulcer as below:  Pressure Injury 11/07/20 Heel Right Deep Tissue Pressure Injury - Purple or maroon localized area of discolored intact skin or blood-filled blister due to damage of underlying soft tissue from pressure and/or shear. (Active)  11/07/20 2000  Location: Heel  Location Orientation: Right  Staging: Deep Tissue Pressure Injury - Purple or maroon localized area of discolored intact skin or blood-filled blister due to damage of underlying soft tissue from pressure and/or shear.  Wound Description (Comments):   Present on Admission:      Pressure Injury 12/15/20 Heel Right Deep Tissue Pressure Injury - Purple or maroon localized area of discolored intact skin or blood-filled blister due to damage of underlying soft tissue from pressure and/or shear. (Active)   12/15/20 1800  Location: Heel  Location Orientation: Right  Staging: Deep Tissue Pressure Injury - Purple or maroon localized area of discolored intact skin or blood-filled blister due to damage of underlying soft tissue from pressure and/or shear.  Wound Description (Comments):   Present on Admission: Yes         DVT prophylaxis:  enoxaparin (LOVENOX) injection 40 mg Start: 12/15/20 1615  Code Status:     Code Status Orders  (From admission, onward)         Start     Ordered   12/15/20 1609  Full code  Continuous        12/15/20 1609        Code Status History    Date Active Date Inactive Code Status Order ID Comments User Context   11/12/2020 1820 12/08/2020 1821 Full Code [id number]  Rayburn, Depasquale Inpatient   11/05/2020 2348 11/12/2020 1756 Full Code [id number]  Vernelle Emerald, MD ED   10/18/2020 2223 10/28/2020 1955 Full Code [id number]  Doran Heater, DO ED   06/19/2017 0232 06/20/2017 2149 Full Code [id number]  Rise Patience, MD ED   09/19/2016 1425 09/25/2016 2116 Full Code [id number]  Stark Klein, MD Inpatient   02/27/2016 2042 03/02/2016 1607 Full Code [id number]  Benito Mccreedy, MD Inpatient   02/27/2016 2042 02/27/2016 2042 Full Code [id number]  Benito Mccreedy, MD Inpatient   08/18/2011 2216 08/23/2011 2339 Full Code [id number]  Theone Stanley, RN Inpatient   Advance Care Planning Activity     Family Communication: None at bedside Disposition Plan:  Status is: Inpatient  Remains inpatient appropriate because:Inpatient level of care appropriate due to severity of illness   Dispo: The patient is from: Home              Anticipated d/c is to: SNF              Patient currently is not medically stable to d/c.   Difficult to place patient No          Antimicrobials:  Anti-infectives (From admission, onward)   Start     Dose/Rate Route Frequency Ordered Stop   12/15/20 2200  cefTRIAXone (ROCEPHIN) 2 g in sodium chloride 0.9 % 100  mL IVPB        2 g 200 mL/hr over 30 Minutes Intravenous Every 12 hours 12/15/20 1650 12/21/20 2159   12/15/20 1645  cefTRIAXone (ROCEPHIN) IVPB  Status:  Discontinued        2 g Intravenous Every 12 hours 12/15/20 1631 12/15/20 1645   12/15/20 1645  metroNIDAZOLE (FLAGYL) tablet 500 mg       Note to Pharmacy: Treat through 12/21/20     500 mg Oral Every 8 hours 12/15/20 1631 12/21/20 2359       Objective: Vitals:   12/16/20 1711 12/16/20 1957 12/17/20 0000 12/17/20 0400  BP: (!) 144/68 (!) 162/78 130/68 (!) 161/83  Pulse: 66 70 62 66  Resp: 18 18 17    Temp: 98.7 F (37.1 C) 98.6 F (37 C) 98.3 F (36.8 C)   TempSrc: Oral Oral Oral   SpO2: 96% 95% 95% 97%  Weight:      Height:        Intake/Output Summary (Last 24 hours) at 12/17/2020 1328 Last data filed at 12/17/2020 0400 Gross per 24 hour  Intake 200 ml  Output 1080 ml  Net -880 ml   Filed Weights   12/15/20 1201 12/16/20 0519  Weight: 86.2 kg 73.3 kg    Examination: General exam: Appears calm and comfortable, frail appearing overall Respiratory system: Clear to auscultation. Respiratory effort normal. Cardiovascular system: S1 & S2 heard, RRR. No pedal edema. Gastrointestinal system: Abdomen is nondistended, soft and nontender. Normal bowel sounds heard. Central nervous system: Alert and oriented. Non focal exam. Speech clear  Extremities: Symmetric in appearance bilaterally  Skin: No rashes, lesions or ulcers on exposed skin  Psychiatry: Judgement and insight appear stable. Mood & affect appropriate.   Data Reviewed: I have personally reviewed following labs and imaging studies  CBC: Recent Labs  Lab 12/13/20 2015 12/15/20 1155 12/15/20 1202 12/16/20 0348 12/17/20 0806  WBC 13.5* 11.6*  --  14.7* 11.3*  NEUTROABS 11.1* 10.1*  --   --   --   HGB 9.7* 10.4* 11.6* 9.8* 10.0*  HCT 30.4* 32.7* 34.0* 30.4* 31.6*  MCV 89.9 89.8  --  89.1 90.5  PLT 361 374  --  342 [id number]   Basic Metabolic Panel: Recent Labs   Lab 12/13/20 2015 12/15/20 1155 12/15/20 1202 12/15/20 1610 12/16/20 0348 12/17/20 0806  NA 135 139 140  --  135 136  K 2.8* 2.6* 2.7*  --  3.5 2.9*  CL 98 101 98  --  100 102  CO2 28 31  --   --  27 30  GLUCOSE 183* 89 84  --  286* 70  BUN 14 10 11   --  7*  5*  CREATININE 0.38* 0.47* 0.30*  --  0.59* 0.37*  CALCIUM 8.3* 8.3*  --   --  8.4* 8.3*  MG  --   --   --  2.3  --   --    GFR: Estimated Creatinine Clearance: 92.9 mL/min (A) (by C-G formula based on SCr of 0.37 mg/dL (L)). Liver Function Tests: Recent Labs  Lab 12/13/20 2015 12/15/20 1155  AST 46* 21  ALT 34 26  ALKPHOS 1,538* 1,159*  BILITOT 0.4 0.7  PROT 6.8 6.9  ALBUMIN 2.8* 2.7*   No results for input(s): LIPASE, AMYLASE in the last 168 hours. No results for input(s): AMMONIA in the last 168 hours. Coagulation Profile: Recent Labs  Lab 12/15/20 1155  INR 1.2   Cardiac Enzymes: No results for input(s): CKTOTAL, CKMB, CKMBINDEX, TROPONINI in the last 168 hours. BNP (last 3 results) No results for input(s): PROBNP in the last 8760 hours. HbA1C: No results for input(s): HGBA1C in the last 72 hours. CBG: Recent Labs  Lab 12/16/20 2002 12/17/20 0427 12/17/20 0448 12/17/20 0505 12/17/20 1143  GLUCAP 203* 32* 134* 119* 132*   Lipid Profile: No results for input(s): CHOL, HDL, LDLCALC, TRIG, CHOLHDL, LDLDIRECT in the last 72 hours. Thyroid Function Tests: No results for input(s): TSH, T4TOTAL, FREET4, T3FREE, THYROIDAB in the last 72 hours. Anemia Panel: No results for input(s): VITAMINB12, FOLATE, FERRITIN, TIBC, IRON, RETICCTPCT in the last 72 hours. Sepsis Labs: Recent Labs  Lab 12/15/20 1158 12/15/20 1814  LATICACIDVEN 0.9 1.8    Recent Results (from the past 240 hour(s))  Resp Panel by RT-PCR (Flu A&B, Covid) Nasopharyngeal Swab     Status: None   Collection Time: 12/10/20  9:30 AM   Specimen: Nasopharyngeal Swab; Nasopharyngeal(NP) swabs in vial transport medium  Result Value Ref Range  Status   SARS Coronavirus 2 by RT PCR NEGATIVE NEGATIVE Final    Comment: (NOTE) SARS-CoV-2 target nucleic acids are NOT DETECTED.  The SARS-CoV-2 RNA is generally detectable in upper respiratory specimens during the acute phase of infection. The lowest concentration of SARS-CoV-2 viral copies this assay can detect is 138 copies/mL. A negative result does not preclude SARS-Cov-2 infection and should not be used as the sole basis for treatment or other patient management decisions. A negative result may occur with  improper specimen collection/handling, submission of specimen other than nasopharyngeal swab, presence of viral mutation(s) within the areas targeted by this assay, and inadequate number of viral copies(<138 copies/mL). A negative result must be combined with clinical observations, patient history, and epidemiological information. The expected result is Negative.  Fact Sheet for Patients:  EntrepreneurPulse.com.au  Fact Sheet for Healthcare Providers:  IncredibleEmployment.be  This test is no t yet approved or cleared by the [place] FDA and  has been authorized for detection and/or diagnosis of SARS-CoV-2 by FDA under an Emergency Use Authorization (EUA). This EUA will remain  in effect (meaning this test can be used) for the duration of the COVID-19 declaration under Section 564(b)(1) of the Act, 21 U.S.C.section 360bbb-3(b)(1), unless the authorization is terminated  or revoked sooner.       Influenza A by PCR NEGATIVE NEGATIVE Final   Influenza B by PCR NEGATIVE NEGATIVE Final    Comment: (NOTE) The Xpert Xpress SARS-CoV-2/FLU/RSV plus assay is intended as an aid in the diagnosis of influenza from Nasopharyngeal swab specimens and should not be used as a sole basis for treatment. Nasal washings and aspirates are unacceptable for Xpert Xpress SARS-CoV-2/FLU/RSV testing.  Fact Sheet for  Patients: EntrepreneurPulse.com.au  Fact Sheet for Healthcare Providers: IncredibleEmployment.be  This test is not yet approved or cleared by the [place] FDA and has been authorized for detection and/or diagnosis of SARS-CoV-2 by FDA under an Emergency Use Authorization (EUA). This EUA will remain in effect (meaning this test can be used) for the duration of the COVID-19 declaration under Section 564(b)(1) of the Act, 21 U.S.C. section 360bbb-3(b)(1), unless the authorization is terminated or revoked.  Performed at [facility], [place] [address] [place], [place] [address]   Resp Panel by RT-PCR (Flu A&B, Covid) Nasopharyngeal Swab     Status: None   Collection Time: 12/15/20 11:52 AM   Specimen: Nasopharyngeal Swab; Nasopharyngeal(NP) swabs in vial transport medium  Result Value Ref Range Status   SARS Coronavirus 2 by RT PCR NEGATIVE NEGATIVE Final    Comment: (NOTE) SARS-CoV-2 target nucleic acids are NOT DETECTED.  The SARS-CoV-2 RNA is generally detectable in upper respiratory specimens during the acute phase of infection. The lowest concentration of SARS-CoV-2 viral copies this assay can detect is 138 copies/mL. A negative result does not preclude SARS-Cov-2 infection and should not be used as the sole basis for treatment or other patient management decisions. A negative result may occur with  improper specimen collection/handling, submission of specimen other than nasopharyngeal swab, presence of viral mutation(s) within the areas targeted by this assay, and inadequate number of viral copies(<138 copies/mL). A negative result must be combined with clinical observations, patient history, and epidemiological information. The expected result is Negative.  Fact Sheet for Patients:  EntrepreneurPulse.com.au  Fact Sheet for Healthcare Providers:  IncredibleEmployment.be  This test is no t yet  approved or cleared by the [place] FDA and  has been authorized for detection and/or diagnosis of SARS-CoV-2 by FDA under an Emergency Use Authorization (EUA). This EUA will remain  in effect (meaning this test can be used) for the duration of the COVID-19 declaration under Section 564(b)(1) of the Act, 21 U.S.C.section 360bbb-3(b)(1), unless the authorization is terminated  or revoked sooner.       Influenza A by PCR NEGATIVE NEGATIVE Final   Influenza B by PCR NEGATIVE NEGATIVE Final    Comment: (NOTE) The Xpert Xpress SARS-CoV-2/FLU/RSV plus assay is intended as an aid in the diagnosis of influenza from Nasopharyngeal swab specimens and should not be used as a sole basis for treatment. Nasal washings and aspirates are unacceptable for Xpert Xpress SARS-CoV-2/FLU/RSV testing.  Fact Sheet for Patients: EntrepreneurPulse.com.au  Fact Sheet for Healthcare Providers: IncredibleEmployment.be  This test is not yet approved or cleared by the [place] FDA and has been authorized for detection and/or diagnosis of SARS-CoV-2 by FDA under an Emergency Use Authorization (EUA). This EUA will remain in effect (meaning this test can be used) for the duration of the COVID-19 declaration under Section 564(b)(1) of the Act, 21 U.S.C. section 360bbb-3(b)(1), unless the authorization is terminated or revoked.  Performed at [facility], [place] [address], [place], [place] [address]   Culture, blood (routine x 2)     Status: None (Preliminary result)   Collection Time: 12/15/20 12:00 PM   Specimen: BLOOD LEFT HAND  Result Value Ref Range Status   Specimen Description BLOOD LEFT HAND  Final   Special Requests   Final    BOTTLES DRAWN AEROBIC AND ANAEROBIC Blood Culture results may not be optimal due to an inadequate volume of blood received in culture bottles   Culture  Final    NO GROWTH 2 DAYS Performed at [facility], [place]  [address] [place], [place] [address]    Report Status PENDING  Incomplete  Culture, blood (routine x 2)     Status: None (Preliminary result)   Collection Time: 12/15/20 12:06 PM   Specimen: BLOOD RIGHT HAND  Result Value Ref Range Status   Specimen Description BLOOD RIGHT HAND  Final   Special Requests   Final    BOTTLES DRAWN AEROBIC AND ANAEROBIC Blood Culture results may not be optimal due to an inadequate volume of blood received in culture bottles   Culture   Final    NO GROWTH 2 DAYS Performed at [facility], [place] [address] [place], [place] [address]    Report Status PENDING  Incomplete  Culture, Urine     Status: Abnormal   Collection Time: 12/16/20  2:24 PM   Specimen: Urine, Random  Result Value Ref Range Status   Specimen Description URINE, RANDOM  Final   Special Requests   Final    NONE Performed at [facility], [place] [address] [place], [place] [address]    Culture MULTIPLE SPECIES PRESENT, SUGGEST RECOLLECTION (A)  Final   Report Status 12/17/2020 FINAL  Final      Radiology Studies: CT CHEST WO CONTRAST  Result Date: 12/16/2020 CLINICAL DATA:  Evaluate empyema EXAM: CT CHEST WITHOUT CONTRAST TECHNIQUE: Multidetector CT imaging of the chest was performed following the standard protocol without IV contrast. COMPARISON:  CT of the abdomen and pelvis from 12/11/2018, chest x-ray from the previous day. FINDINGS: Cardiovascular: Somewhat limited due to lack of IV contrast. Aortic calcifications are noted. Coronary calcifications are seen. Right-sided PICC line is noted in satisfactory position. Mediastinum/Nodes: Thoracic inlet is within normal limits. The esophagus as visualized is within normal limits. No sizable hilar or mediastinal adenopathy is noted. Lungs/Pleura: Lungs are well aerated bilaterally. The left lung shows only minimal scarring in the base. Right lung shows some lower lobe atelectasis which is increased slightly in the interval from the prior CT of  12/10/2020. Additionally, small right-sided pleural effusion is noted which is increased slightly in the interval from the prior exam. It has some thickened margins suggestive of empyema. The thickened margins suggest underlying empyema. A small focus of air is noted within which may be related to prior thoracentesis. No pneumothorax is seen. Upper Abdomen: Postsurgical changes are noted in the stomach. No other focal abnormality is noted in the upper abdomen. Previously seen fluid collection in the right liver posteriorly is not well appreciated on today's exam. Musculoskeletal: Degenerative changes of the thoracic spine are noted. No acute rib abnormality is seen. Postsurgical changes in the cervical spine are noted. No compression deformities are seen. IMPRESSION: Small fluid collection in the posterior aspect of the right pleural space with thickened margins most consistent with a small empyema. A small focus of air is noted within likely related to prior intervention. Slight increase in lower lobe atelectasis on the right when compared with the prior exam. No other acute abnormality is noted. Aortic Atherosclerosis (ICD10-I70.0). Electronically Signed   By: Inez Catalina M.D.   On: 12/16/2020 15:43      Scheduled Meds: . Chlorhexidine Gluconate Cloth  6 each Topical Daily  . citalopram  20 mg Oral Daily  . diclofenac Sodium  2 g Topical QID  . enoxaparin (LOVENOX) injection  40 mg Subcutaneous Q24H  . hydrocerin  1 application Topical BID  .  hydrocortisone cream   Topical BID  . insulin aspart  0-9 Units Subcutaneous TID WC  . insulin aspart  3 Units Subcutaneous TID WC  . insulin detemir  10 Units Subcutaneous Daily  . levETIRAcetam  1,000 mg Oral BID  . lidocaine  1 patch Transdermal Q24H  . lipase/protease/amylase  24,000 Units Oral TID AC  . metroNIDAZOLE  500 mg Oral Q8H  . midodrine  5 mg Oral TID WC  . pantoprazole  40 mg Oral Daily  . potassium chloride  40 mEq Oral Q4H  . sodium  chloride flush  3 mL Intravenous Q12H  . tamsulosin  0.8 mg Oral Daily   Continuous Infusions: . cefTRIAXone (ROCEPHIN)  IV 2 g (12/17/20 0903)     LOS: 1 day      Time spent: 25 minutes   Dessa Phi, DO [facility] 12/17/2020, 1:28 PM   Available via Epic secure chat 7am-7pm After these hours, please refer to coverage provider listed on [email or web address]

## 2020-12-17 NOTE — Progress Notes (Signed)
Patient's FSBS 32, 1 amp D50 given and MD notified. FSBS rechecked after 15 minutes and result is 134. Patient's heart rate also noted to be between 38-45, EKG obtained showing Sinus Bradycardia, MD notified. Orders received for LR bolus. Patient states that he feels ok, able to verbalize who and where he is. Will monitor

## 2020-12-17 NOTE — NC FL2 (Signed)
[place] LEVEL OF CARE SCREENING TOOL     IDENTIFICATION  Patient Name: Colton Bush Birthdate: [birth year] Sex: male Admission Date (Current Location): 12/15/2020  [facility] and [place] Number:  Herbalist and Address:  [facility] [place] [place] [address] [place], [place] [address]      Provider Number: [id number]  Attending Physician Name and Address:  Dessa Phi, DO  Relative Name and Phone Number:  Angel Weedon, [phone]    Current Level of Care: Hospital Recommended Level of Care: [place] Prior Approval Number:    Date Approved/Denied:   PASRR Number: [id number]  Discharge Plan: SNF    Current Diagnoses: Patient Active Problem List   Diagnosis Date Noted  . Anemia of chronic illness 12/09/2020  . Hypokalemia 12/09/2020  . Nausea & vomiting   . Pain   . Bladder outlet obstruction 11/12/2020  . Toxic encephalopathy 11/12/2020  . Atrophic pancreas 11/12/2020  . Empyema lung ([place])   . Subdural hematoma ([place])   . Pneumonia of both lower lobes due to infectious organism 11/06/2020  . Liver abscess 11/06/2020  . Sepsis ([place]) 11/05/2020  . Uncontrolled type 2 diabetes mellitus with ketoacidosis without coma, with long-term current use of insulin ([place]) 11/05/2020  . Increased anion gap metabolic acidosis 97/35/3299  . Chronic diastolic CHF (congestive heart failure) ([place]) 11/05/2020  . Closed right hip fracture, initial encounter ([place]) 10/18/2020  . Fall from ground level 10/18/2020  . Hypoglycemia 06/19/2017  . Hypothermia 06/19/2017  . Goals of care, counseling/discussion 09/29/2016  . Port catheter in place 04/11/2016  . Hypercalcemia 03/29/2016  . Adenocarcinoma of head of pancreas ([place]) 03/17/2016  . Biliary obstruction   . Obstructive jaundice due to malignant neoplasm ([place]) 02/27/2016  . DKA (diabetic ketoacidosis) ([place]) 02/27/2016  . Mixed diabetic hyperlipidemia associated with type 2  diabetes mellitus ([place]) 05/05/2014  . Cervical spondylosis with myelopathy 08/18/2011  . CERUMEN IMPACTION 12/14/2008  . Diabetes ([place]) 09/16/2007  . Hyperlipidemia, mixed 09/16/2007  . Essential hypertension 09/16/2007  . GERD without esophagitis 09/16/2007  . ESOPHAGEAL STRICTURE 04/04/2007  . HIATAL HERNIA 04/04/2007    Orientation RESPIRATION BLADDER Height & Weight     Self,Time,Situation,Place  Normal Continent,External catheter Weight: 161 lb 9.6 oz (73.3 kg) Height:  5\' 11"  (180.3 cm)  BEHAVIORAL SYMPTOMS/MOOD NEUROLOGICAL BOWEL NUTRITION STATUS      Continent Diet (See DC summary)  AMBULATORY STATUS COMMUNICATION OF NEEDS Skin   Extensive Assist Verbally PU Stage and Appropriate Care,Surgical wounds (PI on R heel, Incision on R hip proximinal and anterior, MASD on R and L buttocks)                       Personal Care Assistance Level of Assistance  Bathing,Feeding,Dressing Bathing Assistance: Maximum assistance Feeding assistance: Independent Dressing Assistance: Maximum assistance     Functional Limitations Info  Sight,Hearing,Speech Sight Info: Adequate Hearing Info: Adequate Speech Info: Adequate    SPECIAL CARE FACTORS FREQUENCY  PT (By licensed PT),OT (By licensed OT)     PT Frequency: 5x week OT Frequency: 5x week            Contractures Contractures Info: Not present    Additional Factors Info  Code Status,Allergies,Insulin Sliding Scale Code Status Info: Full Allergies Info: NKA   Insulin Sliding Scale Info: Insulin Aspart (Novolog) 0-9 U 3x daily w/ meals, 3U 3x daily w/ meals, Insulin Detemir (Levemir) 10 U daily  Current Medications (12/17/2020):  This is the current hospital active medication list Current Facility-Administered Medications  Medication Dose Route Frequency Provider Last Rate Last Admin  . acetaminophen (TYLENOL) tablet 650 mg  650 mg Oral Q6H PRN Fuller Plan A, MD       Or  . acetaminophen (TYLENOL)  suppository 650 mg  650 mg Rectal Q6H PRN Corydon Schweiss, Rondell A, MD      . albuterol (PROVENTIL) (2.5 MG/3ML) 0.083% nebulizer solution 2.5 mg  2.5 mg Nebulization Q6H PRN Fuller Plan A, MD      . Chlorhexidine Gluconate Cloth 2 % PADS 6 each  6 each Topical Daily Norval Morton, MD   6 each at 12/17/20 0902  . citalopram (CELEXA) tablet 20 mg  20 mg Oral Daily Tamala Julian, Rondell A, MD   20 mg at 12/17/20 0901  . diclofenac Sodium (VOLTAREN) 1 % topical gel 2 g  2 g Topical QID Fuller Plan A, MD   2 g at 12/17/20 1455  . enoxaparin (LOVENOX) injection 40 mg  40 mg Subcutaneous Q24H Tonae Livolsi, Rondell A, MD   40 mg at 12/17/20 1653  . hydrocerin (EUCERIN) cream 1 application  1 application Topical BID Norval Morton, MD   1 application at 78/29/56 0902  . HYDROcodone-acetaminophen (NORCO/VICODIN) 5-325 MG per tablet 1 tablet  1 tablet Oral Q3H PRN Fuller Plan A, MD      . hydrocortisone cream 1 %   Topical BID Norval Morton, MD   Given at 12/17/20 0902  . insulin aspart (novoLOG) injection 0-9 Units  0-9 Units Subcutaneous TID WC Fuller Plan A, MD   3 Units at 12/17/20 1652  . insulin aspart (novoLOG) injection 3 Units  3 Units Subcutaneous TID WC Dessa Phi, DO   3 Units at 12/17/20 1653  . insulin detemir (LEVEMIR) injection 10 Units  10 Units Subcutaneous Daily Dessa Phi, DO   10 Units at 12/17/20 1209  . levETIRAcetam (KEPPRA) tablet 1,000 mg  1,000 mg Oral BID Fuller Plan A, MD   1,000 mg at 12/17/20 0901  . lidocaine (LIDODERM) 5 % 1 patch  1 patch Transdermal Q24H Fuller Plan A, MD   1 patch at 12/16/20 2243  . linezolid (ZYVOX) tablet 600 mg  600 mg Oral Q12H Mignon Pine, DO      . lipase/protease/amylase (CREON) capsule 24,000 Units  24,000 Units Oral TID AC Norval Morton, MD   24,000 Units at 12/17/20 1652  . loperamide (IMODIUM) capsule 2 mg  2 mg Oral Daily PRN Tamala Julian, Rondell A, MD      . midodrine (PROAMATINE) tablet 5 mg  5 mg Oral TID WC Magdelene Ruark, Rondell A,  MD   5 mg at 12/17/20 1651  . ondansetron (ZOFRAN) tablet 4 mg  4 mg Oral Q6H PRN Fuller Plan A, MD       Or  . ondansetron (ZOFRAN) injection 4 mg  4 mg Intravenous Q6H PRN Jordie Schreur, Rondell A, MD      . pantoprazole (PROTONIX) EC tablet 40 mg  40 mg Oral Daily Tamala Julian, Rondell A, MD   40 mg at 12/17/20 0902  . sodium chloride flush (NS) 0.9 % injection 3 mL  3 mL Intravenous Q12H Lenix Kidd, Rondell A, MD   3 mL at 12/17/20 0902  . tamsulosin (FLOMAX) capsule 0.8 mg  0.8 mg Oral Daily Fuller Plan A, MD   0.8 mg at 12/17/20 0901     Discharge Medications: Please see discharge summary  for a list of discharge medications.  Relevant Imaging Results:  Relevant Lab Results:   Additional Information SSN: [id number]. [place] COVID-19 Vaccine 05/13/2020 , 10/03/2019 , 09/08/2019  Coralee Pesa, LCSWA

## 2020-12-17 NOTE — Consult Note (Signed)
   [facility] Inpatient Consult   12/17/2020  Colton Bush May 03, 1956 [id number]  [facility] [ACO] Patient: Medicare CMS DCE   Patient screened for readmission hospitalization with noted extreme high risk score for unplanned readmission risk and to assess for potential [place] [facility] service needs for post hospital transition.  Review of patient's medical record reveals patient is currently being recommended for a skilled nursing facility level of care for transition.  Patient had previously been referred to his Embedded team as well.  Plan:  Continue to follow progress and disposition to assess for post hospital care management needs.    For questions contact:   Natividad Brood, RN BSN [facility] Liaison  [phone] business mobile phone Toll free office [phone]  Fax number: [phone] [email or web address]

## 2020-12-18 LAB — BASIC METABOLIC PANEL
Anion gap: 6 (ref 5–15)
BUN: 6 mg/dL — ABNORMAL LOW (ref 8–23)
CO2: 29 mmol/L (ref 22–32)
Calcium: 8.6 mg/dL — ABNORMAL LOW (ref 8.9–10.3)
Chloride: 100 mmol/L (ref 98–111)
Creatinine, Ser: 0.54 mg/dL — ABNORMAL LOW (ref 0.61–1.24)
GFR, Estimated: >60 mL/min (ref >60)
Glucose, Bld: 136 mg/dL — ABNORMAL HIGH (ref 70–99)
Potassium: 3.9 mmol/L (ref 3.5–5.1)
Sodium: 135 mmol/L (ref 135–145)

## 2020-12-18 LAB — CBC
HCT: 30.5 % — ABNORMAL LOW (ref 39.0–52.0)
Hemoglobin: 9.8 g/dL — ABNORMAL LOW (ref 13.0–17.0)
MCH: 29 pg (ref 26.0–34.0)
MCHC: 32.1 g/dL (ref 30.0–36.0)
MCV: 90.2 fL (ref 80.0–100.0)
Platelets: 382 10*3/uL (ref 150–400)
RBC: 3.38 MIL/uL — ABNORMAL LOW (ref 4.22–5.81)
RDW: 18.6 % — ABNORMAL HIGH (ref 11.5–15.5)
WBC: 10.1 10*3/uL (ref 4.0–10.5)
nRBC: 0 % (ref 0.0–0.2)

## 2020-12-18 LAB — GLUCOSE, CAPILLARY
Glucose-Capillary: 132 mg/dL — ABNORMAL HIGH (ref 70–99)
Glucose-Capillary: 218 mg/dL — ABNORMAL HIGH (ref 70–99)
Glucose-Capillary: 254 mg/dL — ABNORMAL HIGH (ref 70–99)
Glucose-Capillary: 236 mg/dL — ABNORMAL HIGH (ref 70–99)

## 2020-12-18 MED ORDER — GUAIFENESIN 100 MG/5ML PO SOLN
5.0000 mL | ORAL | Status: DC | PRN
  Administered 2020-12-18: 100 mg via ORAL
  Filled 2020-12-18: qty 5

## 2020-12-18 MED ORDER — ZOLPIDEM TARTRATE 5 MG PO TABS
5.0000 mg | ORAL_TABLET | Freq: Every evening | ORAL | Status: DC | PRN
  Administered 2020-12-18 – 2020-12-20 (×2): 5 mg via ORAL
  Filled 2020-12-18 (×4): qty 1

## 2020-12-18 MED ORDER — TRAZODONE HCL 50 MG PO TABS: 50.0000 mg | ORAL_TABLET | Freq: Every evening | ORAL | Status: DC | PRN

## 2020-12-18 NOTE — Progress Notes (Signed)
Request to IR for right empyema drainage - patient history and imaging reviewed by IR attending, Dr. Laurence Ferrari, who notes collection to be too small for percutaneous drainage at this time. Recommend continued monitoring and if patient's clinic picture worsen consider repeat imaging and re-consult IR if fluid collection persists.  Above was discussed with Dr. Maylene Roes today via secure chat.  Order will be cancelled - please place a new consult order if patient require re-evaluation.  Candiss Norse, PA-C

## 2020-12-18 NOTE — Progress Notes (Signed)
Physical Therapy Treatment Patient Details Name: Colton Bush MRN: [id number] DOB: [birth year] Today's Date: 12/18/2020    History of Present Illness Pt is a 65 y.o. male admitted 12/15/20 found unresponsive by wife, pt hypoglycemic. Head CT negative for acute changes. CXR with known R pleural effusion. Workup for hypoglycemia, hypokalemia. Of note, pt with recent admission 11/05/20 with sepsis s/p burr hole evacuation of L SDH, with CIR admission 11/12/20-12/08/20, recent ED visits 5/12 and 5/16 for weakness. Other PMH includes HTN, hernia, scoliosis, ACDF (2013), R femur fx (09/2020).    PT Comments    The pt was able to demo great progress in OOB mobility at this time. He continues to be able to complete bed mobility with increased time and effort, but no physical assist. He was then able to complete multiple sit-stand transfers with minG for safety with increased time to power up and steady due to pain in R knee and poor functional strength and power in his BLE. The pt was able to progress to two short bouts of ambulation in the room with minA of 1 for safety. He had no LOB, and was able to self-monitor for fatigue and need of seated rest. HR max to 130 bpm with ambulation, recovered after 3 min seated rest after which pt ambulated further within the room. Continue to recommend max mobility for the pt to maximize functional recovery of strength and endurance. The pt currently will still benefit from SNF level rehab at d/c, but may be able to progress to home with OPPT and support of wife if he continues to make progress at this rate.     Follow Up Recommendations  SNF;Supervision for mobility/OOB     Equipment Recommendations  None recommended by PT    Recommendations for Other Services       Precautions / Restrictions Precautions Precautions: Fall Restrictions Weight Bearing Restrictions: Yes RLE Weight Bearing: Weight bearing as tolerated    Mobility  Bed Mobility Overal bed  mobility: Needs Assistance Bed Mobility: Supine to Sit     Supine to sit: Supervision;HOB elevated     General bed mobility comments: Increased time and effort, use of bed rail    Transfers Overall transfer level: Needs assistance Equipment used: Rolling walker (2 wheeled) Transfers: Sit to/from Stand Sit to Stand: Min assist;Min guard         General transfer comment: Increased time and effort to achieve fully upright posture, forward flexed posture with heavy reliance on UE support; minA for stability at times, but mostly just guarding for safety  Ambulation/Gait Ambulation/Gait assistance: Min assist;+2 safety/equipment Gait Distance (Feet): 15 Feet (+25 ft) Assistive device: Rolling walker (2 wheeled) Gait Pattern/deviations: Step-to pattern;Step-through pattern;Decreased step length - left;Decreased weight shift to right;Decreased stride length;Antalgic Gait velocity: decreased Gait velocity interpretation: <1.31 ft/sec, indicative of household ambulator General Gait Details: pt with slow gait, decreased wt shift to RLE due to reports of pain in R knee. able to increase to step-through gait when cued. also cued to improve trunk positioning, but only able to maintain one cue at a time (increased stride or reduced trunk flexion)       Balance Overall balance assessment: Needs assistance Sitting-balance support: No upper extremity supported;Feet supported Sitting balance-[name]: Fair     Standing balance support: Bilateral upper extremity supported;During functional activity Standing balance-[name]: Poor Standing balance comment: Heavy reliance on UE support  Cognition Arousal/Alertness: Awake/alert Behavior During Therapy: WFL for tasks assessed/performed;Flat affect Overall Cognitive Status: No family/caregiver present to determine baseline cognitive functioning Area of Impairment: Memory;Following  commands;Safety/judgement;Problem solving                     Memory: Decreased short-term memory Following Commands: Follows one step commands consistently;Follows one step commands with increased time Safety/Judgement: Decreased awareness of deficits;Decreased awareness of safety Awareness: Emergent Problem Solving: Decreased initiation;Slow processing;Difficulty sequencing;Requires verbal cues General Comments: Pt following simple cues, often with increased time. generally agreeable and motivated, waits for verbal cues for sequencing rather than attempting to solve independently      Exercises General Exercises - Lower Extremity Long Arc Quad: AROM;Both;15 reps;Seated Toe Raises: AROM;Both;15 reps;Seated Heel Raises: AROM;Both;15 reps;Seated    General Comments General comments (skin integrity, edema, etc.): VSS on RA      Pertinent Vitals/Pain Pain Assessment: Faces Faces Pain Scale: Hurts a little bit Pain Location: R knee with wt bearing Pain Descriptors / Indicators: Discomfort Pain Intervention(s): Limited activity within patient's tolerance;Monitored during session           PT Goals (current goals can now be found in the care plan section) Acute Rehab PT Goals Patient Stated Goal: get better; interested in post-acute rehab PT Goal Formulation: With patient Time For Goal Achievement: 12/30/20 Potential to Achieve Goals: Good Progress towards PT goals: Progressing toward goals    Frequency    Min 2X/week      PT Plan Current plan remains appropriate       AM-PAC PT "6 Clicks" Mobility   Outcome Measure  Help needed turning from your back to your side while in a flat bed without using bedrails?: None Help needed moving from lying on your back to sitting on the side of a flat bed without using bedrails?: A Little Help needed moving to and from a bed to a chair (including a wheelchair)?: A Little Help needed standing up from a chair using your  arms (e.g., wheelchair or bedside chair)?: A Little Help needed to walk in hospital room?: A Little Help needed climbing 3-5 steps with a railing? : A Little 6 Click Score: 19    End of Session Equipment Utilized During Treatment: Gait belt Activity Tolerance: Patient tolerated treatment well Patient left: in chair;with call bell/phone within reach;with chair alarm set Nurse Communication: Mobility status PT Visit Diagnosis: Other abnormalities of gait and mobility (R26.89);Muscle weakness (generalized) (M62.81)     Time: 9528-4132 PT Time Calculation (min) (ACUTE ONLY): 25 min  Charges:  $Gait Training: 8-22 mins $Therapeutic Activity: 8-22 mins                     Karma Ganja, PT, DPT   Acute Rehabilitation Department Pager #: [phone]   Otho Bellows 12/18/2020, 10:12 AM

## 2020-12-18 NOTE — Progress Notes (Signed)
PROGRESS NOTE    Colton Bush  [id number] DOB: [birth year] DOA: 12/15/2020 PCP: [name], MD     Brief Narrative:  Colton Bush is a 65 y.o. male with medical history significant of hypertension, hyperlipidemia, recurrent pancreatic cancer who presents after his wife found him unresponsive this morning.  Most of the history is obtained from the patient's wife who is present at bedside.  She states that normally he is up before she is, but this morning he was still in bed.  After little while she went and checked on him and noticed that he was drooling of the side of his mouth and had sonorous respirations.  Denied seeing any seizure-like activity.  Attempted multiple times to wake him and thereafter called 911.  They found his blood sugar to be around 41.  Patient had last taken Levemir approximately 20 units the night before, but did not eat dinner.  Since he had been home from rehab he reportedly had been eating regularly.  However, patient reports that he recently he had just started feeling bad over the last 2 to 3 days.  Wife also reports that over the last 2 to 3 days his blood sugars that intermittently been dropping into the 60-70s at home.  After he was discharged from rehab he reported that they sent him home on MS Contin as well as hydrocodone, but he did not like the way the made him feel and has not really taken it.  Patient just recently been hospitalized for right hip fracture status post intramedullary nail in 09/2020. His hospital course was complicated E. coli UTI along with strep anginosis bacteremia in 1 of 4 blood cultures treated with Rocephin and then amoxicillin for total of 7 days.  Patient was sent to a rehab facility, but subsequently had to be hospitalized from 4/8-4/15 with severe sepsis found to have liver abscesses status post JP drain x2 and right empyema.  During his hospitalization patient was noted to be acutely confused for which imaging studies of the brain  revealed subdural hematomas requiring burr holes.  He was seen by ID and recommended to continue on Rocephin and oral metronidazole to complete a total of 6 weeks (5/24). Patient was noted to have concern for probable seizures for which he was started on Keppra.  Patient was he went to inpatient rehab from 4/15-5/11.  After that he was able to be discharged from rehab he presented back to the emergency department on 5/12 and again on 5/16 for weakness.  He was now hospitalized on 5/18 for hypoglycemia.  New events last 24 hours / Subjective: Patient sitting in a recliner today, very frail and weak.  Working on his leg exercises.  No physical complaints of chest pain, abdominal pain, shortness of breath, nausea or vomiting.  Assessment & Plan:   Principal Problem:   Empyema lung ([place]) Active Problems:   Hyperlipidemia, mixed   Essential hypertension   GERD without esophagitis   Hypoglycemia   Liver abscess   Bladder outlet obstruction   Hypokalemia   Hypoglycemia with history of diabetes mellitus type 2, poorly controlled -Hypoglycemia resolved -Continue Levemir, sliding scale insulin -Appreciate diabetic coordinator  Leukocytosis -Blood cultures negative to date -UA negative, urine culture with multiple species -CT A/P: Subtle/questionable edema within the medial cortex of the RIGHT kidney, and new fluid stranding overlying the upper pole of the RIGHT kidney. This may represent mild/early pyelonephritis. Complex/fluid collection at the RIGHT lung base is partially imaged, more  completely imaged on earlier abdomen CT of 11/25/2020, compatible with previously described empyema.  -Improving  Empyema -CT A/P: Complex/fluid collection at the RIGHT lung base is partially imaged, more completely imaged on earlier abdomen CT of 11/25/2020, compatible with previously described empyema. Consider complete characterization with chest CT. -Chest CT with persistent small right-sided  empyema -Appreciate infectious disease -IR consulted, after reviewing images, they feel the empyema is too small for percutaneous drainage at this time -Continue linezolid  Liver abscesses -Has been status post JP drain x2 -CT A/P: No recurrent abscess -Rocephin and Flagyl discontinued  Recurrent pancreatic cancer -History of Whipple procedure status post chemo and radiation -Continue Creon -Follows with Dr. [name] appreciate her stopping by  Hypotension -Continue midodrine  Seizure disorder -Continue Keppra  History of subdural hematoma status post bur hole 4/12 -Repeat CT head 5/16: No significant change in size or appearance of thin left convexity subdural hematoma without significant mass effect or midline shift. No acute interval change since 12/10/2020.  BPH with chronic indwelling Foley catheter -Continue Flomax -Supposed to follow with outpatient urology for voiding trial   Recent right hip fracture status postrepair by intramedullary nail 09/2020 -PT OT recommending SNF placement    In agreement with assessment of the pressure ulcer as below:  Pressure Injury 11/07/20 Heel Right Deep Tissue Pressure Injury - Purple or maroon localized area of discolored intact skin or blood-filled blister due to damage of underlying soft tissue from pressure and/or shear. (Active)  11/07/20 2000  Location: Heel  Location Orientation: Right  Staging: Deep Tissue Pressure Injury - Purple or maroon localized area of discolored intact skin or blood-filled blister due to damage of underlying soft tissue from pressure and/or shear.  Wound Description (Comments):   Present on Admission:      Pressure Injury 12/15/20 Heel Right Deep Tissue Pressure Injury - Purple or maroon localized area of discolored intact skin or blood-filled blister due to damage of underlying soft tissue from pressure and/or shear. (Active)  12/15/20 1800  Location: Heel  Location Orientation: Right  Staging: Deep  Tissue Pressure Injury - Purple or maroon localized area of discolored intact skin or blood-filled blister due to damage of underlying soft tissue from pressure and/or shear.  Wound Description (Comments):   Present on Admission: Yes         DVT prophylaxis:  enoxaparin (LOVENOX) injection 40 mg Start: 12/15/20 1615  Code Status:     Code Status Orders  (From admission, onward)         Start     Ordered   12/15/20 1609  Full code  Continuous        12/15/20 1609        Code Status History    Date Active Date Inactive Code Status Order ID Comments User Context   11/12/2020 1820 12/08/2020 1821 Full Code [id number]  Mosa, Hillier Inpatient   11/05/2020 2348 11/12/2020 1756 Full Code [id number]  Vernelle Emerald, MD ED   10/18/2020 2223 10/28/2020 1955 Full Code [id number]  Doran Heater, DO ED   06/19/2017 0232 06/20/2017 2149 Full Code [id number]  Rise Patience, MD ED   09/19/2016 1425 09/25/2016 2116 Full Code [id number]  Stark Klein, MD Inpatient   02/27/2016 2042 03/02/2016 1607 Full Code [id number]  Benito Mccreedy, MD Inpatient   02/27/2016 2042 02/27/2016 2042 Full Code [id number]  Benito Mccreedy, MD Inpatient   08/18/2011 2216 08/23/2011 2339 Full Code [id number]  Hovander, Floreen Comber, RN Inpatient  Advance Bush Planning Activity     Family Communication: None at bedside Disposition Plan:  Status is: Inpatient  Remains inpatient appropriate because:Inpatient level of Bush appropriate due to severity of illness   Dispo: The patient is from: Home              Anticipated d/c is to: SNF              Patient currently is not medically stable to d/c.   Difficult to place patient No          Antimicrobials:  Anti-infectives (From admission, onward)   Start     Dose/Rate Route Frequency Ordered Stop   12/17/20 2200  linezolid (ZYVOX) tablet 600 mg        600 mg Oral Every 12 hours 12/17/20 1543     12/15/20 2200  cefTRIAXone (ROCEPHIN) 2 g in  sodium chloride 0.9 % 100 mL IVPB  Status:  Discontinued        2 g 200 mL/hr over 30 Minutes Intravenous Every 12 hours 12/15/20 1650 12/17/20 1543   12/15/20 1645  cefTRIAXone (ROCEPHIN) IVPB  Status:  Discontinued        2 g Intravenous Every 12 hours 12/15/20 1631 12/15/20 1645   12/15/20 1645  metroNIDAZOLE (FLAGYL) tablet 500 mg  Status:  Discontinued       Note to Pharmacy: Treat through 12/21/20     500 mg Oral Every 8 hours 12/15/20 1631 12/17/20 1543       Objective: Vitals:   12/17/20 2043 12/18/20 0500 12/18/20 0700 12/18/20 1126  BP: (!) 147/76  (!) 158/83 (!) 159/89  Pulse: 66  63 62  Resp: 18  15 18   Temp: 98.6 F (37 C)  97.7 F (36.5 C) 98.9 F (37.2 C)  TempSrc: Oral  Oral Oral  SpO2: 96%  96% 97%  Weight:  72.6 kg    Height:        Intake/Output Summary (Last 24 hours) at 12/18/2020 1244 Last data filed at 12/18/2020 0844 Gross per 24 hour  Intake 340 ml  Output 1000 ml  Net -660 ml   Filed Weights   12/15/20 1201 12/16/20 0519 12/18/20 0500  Weight: 86.2 kg 73.3 kg 72.6 kg    Examination: General exam: Appears calm and comfortable, frail and chronically ill-appearing, weak Respiratory system: Clear to auscultation. Respiratory effort normal. Cardiovascular system: S1 & S2 heard, RRR. No pedal edema. Gastrointestinal system: Abdomen is nondistended, soft and nontender. Normal bowel sounds heard. Central nervous system: Alert and oriented. Non focal exam. Speech clear  Extremities: Symmetric in appearance bilaterally  Skin: No rashes, lesions or ulcers on exposed skin  Psychiatry: Judgement and insight appear stable. Mood & affect appropriate.    Data Reviewed: I have personally reviewed following labs and imaging studies  CBC: Recent Labs  Lab 12/13/20 2015 12/15/20 1155 12/15/20 1202 12/16/20 0348 12/17/20 0806 12/18/20 0310  WBC 13.5* 11.6*  --  14.7* 11.3* 10.1  NEUTROABS 11.1* 10.1*  --   --   --   --   HGB 9.7* 10.4* 11.6* 9.8*  10.0* 9.8*  HCT 30.4* 32.7* 34.0* 30.4* 31.6* 30.5*  MCV 89.9 89.8  --  89.1 90.5 90.2  PLT 361 374  --  342 351 [id number]   Basic Metabolic Panel: Recent Labs  Lab 12/13/20 2015 12/15/20 1155 12/15/20 1202 12/15/20 1610 12/16/20 0348 12/17/20 0806 12/18/20 0310  NA 135 139 140  --  135 136 135  K  2.8* 2.6* 2.7*  --  3.5 2.9* 3.9  CL 98 101 98  --  100 102 100  CO2 28 31  --   --  27 30 29   GLUCOSE 183* 89 84  --  286* 70 136*  BUN 14 10 11   --  7* 5* 6*  CREATININE 0.38* 0.47* 0.30*  --  0.59* 0.37* 0.54*  CALCIUM 8.3* 8.3*  --   --  8.4* 8.3* 8.6*  MG  --   --   --  2.3  --   --   --    GFR: Estimated Creatinine Clearance: 92 mL/min (A) (by C-G formula based on SCr of 0.54 mg/dL (L)). Liver Function Tests: Recent Labs  Lab 12/13/20 2015 12/15/20 1155  AST 46* 21  ALT 34 26  ALKPHOS 1,538* 1,159*  BILITOT 0.4 0.7  PROT 6.8 6.9  ALBUMIN 2.8* 2.7*   No results for input(s): LIPASE, AMYLASE in the last 168 hours. No results for input(s): AMMONIA in the last 168 hours. Coagulation Profile: Recent Labs  Lab 12/15/20 1155  INR 1.2   Cardiac Enzymes: No results for input(s): CKTOTAL, CKMB, CKMBINDEX, TROPONINI in the last 168 hours. BNP (last 3 results) No results for input(s): PROBNP in the last 8760 hours. HbA1C: No results for input(s): HGBA1C in the last 72 hours. CBG: Recent Labs  Lab 12/17/20 1143 12/17/20 1617 12/17/20 2212 12/18/20 0535 12/18/20 1124  GLUCAP 132* 239* 183* 132* 218*   Lipid Profile: No results for input(s): CHOL, HDL, LDLCALC, TRIG, CHOLHDL, LDLDIRECT in the last 72 hours. Thyroid Function Tests: No results for input(s): TSH, T4TOTAL, FREET4, T3FREE, THYROIDAB in the last 72 hours. Anemia Panel: No results for input(s): VITAMINB12, FOLATE, FERRITIN, TIBC, IRON, RETICCTPCT in the last 72 hours. Sepsis Labs: Recent Labs  Lab 12/15/20 1158 12/15/20 1814  LATICACIDVEN 0.9 1.8    Recent Results (from the past 240 hour(s))  Resp  Panel by RT-PCR (Flu A&B, Covid) Nasopharyngeal Swab     Status: None   Collection Time: 12/10/20  9:30 AM   Specimen: Nasopharyngeal Swab; Nasopharyngeal(NP) swabs in vial transport medium  Result Value Ref Range Status   SARS Coronavirus 2 by RT PCR NEGATIVE NEGATIVE Final    Comment: (NOTE) SARS-CoV-2 target nucleic acids are NOT DETECTED.  The SARS-CoV-2 RNA is generally detectable in upper respiratory specimens during the acute phase of infection. The lowest concentration of SARS-CoV-2 viral copies this assay can detect is 138 copies/mL. A negative result does not preclude SARS-Cov-2 infection and should not be used as the sole basis for treatment or other patient management decisions. A negative result may occur with  improper specimen collection/handling, submission of specimen other than nasopharyngeal swab, presence of viral mutation(s) within the areas targeted by this assay, and inadequate number of viral copies(<138 copies/mL). A negative result must be combined with clinical observations, patient history, and epidemiological information. The expected result is Negative.  Fact Sheet for Patients:  EntrepreneurPulse.com.au  Fact Sheet for Healthcare Providers:  IncredibleEmployment.be  This test is no t yet approved or cleared by the [place] FDA and  has been authorized for detection and/or diagnosis of SARS-CoV-2 by FDA under an Emergency Use Authorization (EUA). This EUA will remain  in effect (meaning this test can be used) for the duration of the COVID-19 declaration under Section 564(b)(1) of the Act, 21 U.S.C.section 360bbb-3(b)(1), unless the authorization is terminated  or revoked sooner.       Influenza A by PCR  NEGATIVE NEGATIVE Final   Influenza B by PCR NEGATIVE NEGATIVE Final    Comment: (NOTE) The Xpert Xpress SARS-CoV-2/FLU/RSV plus assay is intended as an aid in the diagnosis of influenza from Nasopharyngeal  swab specimens and should not be used as a sole basis for treatment. Nasal washings and aspirates are unacceptable for Xpert Xpress SARS-CoV-2/FLU/RSV testing.  Fact Sheet for Patients: EntrepreneurPulse.com.au  Fact Sheet for Healthcare Providers: IncredibleEmployment.be  This test is not yet approved or cleared by the [place] FDA and has been authorized for detection and/or diagnosis of SARS-CoV-2 by FDA under an Emergency Use Authorization (EUA). This EUA will remain in effect (meaning this test can be used) for the duration of the COVID-19 declaration under Section 564(b)(1) of the Act, 21 U.S.C. section 360bbb-3(b)(1), unless the authorization is terminated or revoked.  Performed at [facility], [place] [address], [place], [place] [address]   Resp Panel by RT-PCR (Flu A&B, Covid) Nasopharyngeal Swab     Status: None   Collection Time: 12/15/20 11:52 AM   Specimen: Nasopharyngeal Swab; Nasopharyngeal(NP) swabs in vial transport medium  Result Value Ref Range Status   SARS Coronavirus 2 by RT PCR NEGATIVE NEGATIVE Final    Comment: (NOTE) SARS-CoV-2 target nucleic acids are NOT DETECTED.  The SARS-CoV-2 RNA is generally detectable in upper respiratory specimens during the acute phase of infection. The lowest concentration of SARS-CoV-2 viral copies this assay can detect is 138 copies/mL. A negative result does not preclude SARS-Cov-2 infection and should not be used as the sole basis for treatment or other patient management decisions. A negative result may occur with  improper specimen collection/handling, submission of specimen other than nasopharyngeal swab, presence of viral mutation(s) within the areas targeted by this assay, and inadequate number of viral copies(<138 copies/mL). A negative result must be combined with clinical observations, patient history, and epidemiological information. The expected result is  Negative.  Fact Sheet for Patients:  EntrepreneurPulse.com.au  Fact Sheet for Healthcare Providers:  IncredibleEmployment.be  This test is no t yet approved or cleared by the [place] FDA and  has been authorized for detection and/or diagnosis of SARS-CoV-2 by FDA under an Emergency Use Authorization (EUA). This EUA will remain  in effect (meaning this test can be used) for the duration of the COVID-19 declaration under Section 564(b)(1) of the Act, 21 U.S.C.section 360bbb-3(b)(1), unless the authorization is terminated  or revoked sooner.       Influenza A by PCR NEGATIVE NEGATIVE Final   Influenza B by PCR NEGATIVE NEGATIVE Final    Comment: (NOTE) The Xpert Xpress SARS-CoV-2/FLU/RSV plus assay is intended as an aid in the diagnosis of influenza from Nasopharyngeal swab specimens and should not be used as a sole basis for treatment. Nasal washings and aspirates are unacceptable for Xpert Xpress SARS-CoV-2/FLU/RSV testing.  Fact Sheet for Patients: EntrepreneurPulse.com.au  Fact Sheet for Healthcare Providers: IncredibleEmployment.be  This test is not yet approved or cleared by the [place] FDA and has been authorized for detection and/or diagnosis of SARS-CoV-2 by FDA under an Emergency Use Authorization (EUA). This EUA will remain in effect (meaning this test can be used) for the duration of the COVID-19 declaration under Section 564(b)(1) of the Act, 21 U.S.C. section 360bbb-3(b)(1), unless the authorization is terminated or revoked.  Performed at [facility], [place] [address], [place], [place] [address]   Culture, blood (routine x 2)     Status: None (Preliminary result)   Collection Time: 12/15/20  12:00 PM   Specimen: BLOOD LEFT HAND  Result Value Ref Range Status   Specimen Description BLOOD LEFT HAND  Final   Special Requests   Final    BOTTLES DRAWN AEROBIC AND ANAEROBIC  Blood Culture results may not be optimal due to an inadequate volume of blood received in culture bottles   Culture   Final    NO GROWTH 2 DAYS Performed at [facility], [address], [place], [place] [address]    Report Status PENDING  Incomplete  Culture, blood (routine x 2)     Status: None (Preliminary result)   Collection Time: 12/15/20 12:06 PM   Specimen: BLOOD RIGHT HAND  Result Value Ref Range Status   Specimen Description BLOOD RIGHT HAND  Final   Special Requests   Final    BOTTLES DRAWN AEROBIC AND ANAEROBIC Blood Culture results may not be optimal due to an inadequate volume of blood received in culture bottles   Culture   Final    NO GROWTH 2 DAYS Performed at [facility], [address], [place], [place] [address]    Report Status PENDING  Incomplete  Culture, Urine     Status: Abnormal   Collection Time: 12/16/20  2:24 PM   Specimen: Urine, Random  Result Value Ref Range Status   Specimen Description URINE, RANDOM  Final   Special Requests   Final    NONE Performed at [facility], [address] [place], [place] [address]    Culture MULTIPLE SPECIES PRESENT, SUGGEST RECOLLECTION (A)  Final   Report Status 12/17/2020 FINAL  Final      Radiology Studies: CT CHEST WO CONTRAST  Result Date: 12/16/2020 CLINICAL DATA:  Evaluate empyema EXAM: CT CHEST WITHOUT CONTRAST TECHNIQUE: Multidetector CT imaging of the chest was performed following the standard protocol without IV contrast. COMPARISON:  CT of the abdomen and pelvis from 12/11/2018, chest x-ray from the previous day. FINDINGS: Cardiovascular: Somewhat limited due to lack of IV contrast. Aortic calcifications are noted. Coronary calcifications are seen. Right-sided PICC line is noted in satisfactory position. Mediastinum/Nodes: Thoracic inlet is within normal limits. The esophagus as visualized is within normal limits. No sizable hilar or mediastinal adenopathy is noted. Lungs/Pleura: Lungs  are well aerated bilaterally. The left lung shows only minimal scarring in the base. Right lung shows some lower lobe atelectasis which is increased slightly in the interval from the prior CT of 12/10/2020. Additionally, small right-sided pleural effusion is noted which is increased slightly in the interval from the prior exam. It has some thickened margins suggestive of empyema. The thickened margins suggest underlying empyema. A small focus of air is noted within which may be related to prior thoracentesis. No pneumothorax is seen. Upper Abdomen: Postsurgical changes are noted in the stomach. No other focal abnormality is noted in the upper abdomen. Previously seen fluid collection in the right liver posteriorly is not well appreciated on today's exam. Musculoskeletal: Degenerative changes of the thoracic spine are noted. No acute rib abnormality is seen. Postsurgical changes in the cervical spine are noted. No compression deformities are seen. IMPRESSION: Small fluid collection in the posterior aspect of the right pleural space with thickened margins most consistent with a small empyema. A small focus of air is noted within likely related to prior intervention. Slight increase in lower lobe atelectasis on the right when compared with the prior exam. No other acute abnormality is noted. Aortic Atherosclerosis (ICD10-I70.0). Electronically Signed  By: Inez Catalina M.D.   On: 12/16/2020 15:43      Scheduled Meds: . Chlorhexidine Gluconate Cloth  6 each Topical Daily  . citalopram  20 mg Oral Daily  . diclofenac Sodium  2 g Topical QID  . enoxaparin (LOVENOX) injection  40 mg Subcutaneous Q24H  . hydrocerin  1 application Topical BID  . hydrocortisone cream   Topical BID  . insulin aspart  0-9 Units Subcutaneous TID WC  . insulin aspart  3 Units Subcutaneous TID WC  . insulin detemir  10 Units Subcutaneous Daily  . levETIRAcetam  1,000 mg Oral BID  . lidocaine  1 patch Transdermal Q24H  . linezolid   600 mg Oral Q12H  . lipase/protease/amylase  24,000 Units Oral TID AC  . midodrine  5 mg Oral TID WC  . pantoprazole  40 mg Oral Daily  . sodium chloride flush  3 mL Intravenous Q12H  . tamsulosin  0.8 mg Oral Daily   Continuous Infusions:    LOS: 2 days      Time spent: 25 minutes   Dessa Phi, DO [facility] 12/18/2020, 12:44 PM   Available via Epic secure chat 7am-7pm After these hours, please refer to coverage provider listed on [email or web address]

## 2020-12-19 LAB — CBC
HCT: 31.8 % — ABNORMAL LOW (ref 39.0–52.0)
Hemoglobin: 10.4 g/dL — ABNORMAL LOW (ref 13.0–17.0)
MCH: 29.1 pg (ref 26.0–34.0)
MCHC: 32.7 g/dL (ref 30.0–36.0)
MCV: 88.8 fL (ref 80.0–100.0)
Platelets: 393 10*3/uL (ref 150–400)
RBC: 3.58 MIL/uL — ABNORMAL LOW (ref 4.22–5.81)
RDW: 18.6 % — ABNORMAL HIGH (ref 11.5–15.5)
WBC: 8 10*3/uL (ref 4.0–10.5)
nRBC: 0 % (ref 0.0–0.2)

## 2020-12-19 LAB — BASIC METABOLIC PANEL
Anion gap: 7 (ref 5–15)
BUN: 7 mg/dL — ABNORMAL LOW (ref 8–23)
CO2: 28 mmol/L (ref 22–32)
Calcium: 8.4 mg/dL — ABNORMAL LOW (ref 8.9–10.3)
Chloride: 100 mmol/L (ref 98–111)
Creatinine, Ser: 0.54 mg/dL — ABNORMAL LOW (ref 0.61–1.24)
GFR, Estimated: >60 mL/min (ref >60)
Glucose, Bld: 190 mg/dL — ABNORMAL HIGH (ref 70–99)
Potassium: 3.7 mmol/L (ref 3.5–5.1)
Sodium: 135 mmol/L (ref 135–145)

## 2020-12-19 LAB — GLUCOSE, CAPILLARY
Glucose-Capillary: 186 mg/dL — ABNORMAL HIGH (ref 70–99)
Glucose-Capillary: 236 mg/dL — ABNORMAL HIGH (ref 70–99)
Glucose-Capillary: 249 mg/dL — ABNORMAL HIGH (ref 70–99)
Glucose-Capillary: 235 mg/dL — ABNORMAL HIGH (ref 70–99)

## 2020-12-19 NOTE — Progress Notes (Signed)
PROGRESS NOTE    Colton Bush  ZJQ:[id number] DOB: 1955-09-27 DOA: 12/15/2020 PCP: [name], MD     Brief Narrative:  Colton Bush is a 65 y.o. male with medical history significant of hypertension, hyperlipidemia, recurrent pancreatic cancer who presents after his wife found him unresponsive this morning.  Most of the history is obtained from the patient's wife who is present at bedside.  She states that normally he is up before she is, but this morning he was still in bed.  After little while she went and checked on him and noticed that he was drooling of the side of his mouth and had sonorous respirations.  Denied seeing any seizure-like activity.  Attempted multiple times to wake him and thereafter called 911.  They found his blood sugar to be around 41.  Patient had last taken Levemir approximately 20 units the night before, but did not eat dinner.  Since he had been home from rehab he reportedly had been eating regularly.  However, patient reports that he recently he had just started feeling bad over the last 2 to 3 days.  Wife also reports that over the last 2 to 3 days his blood sugars that intermittently been dropping into the 60-70s at home.  After he was discharged from rehab he reported that they sent him home on MS Contin as well as hydrocodone, but he did not like the way the made him feel and has not really taken it.  Patient just recently been hospitalized for right hip fracture status post intramedullary nail in 09/2020. His hospital course was complicated E. coli UTI along with strep anginosis bacteremia in 1 of 4 blood cultures treated with Rocephin and then amoxicillin for total of 7 days.  Patient was sent to a rehab facility, but subsequently had to be hospitalized from 4/8-4/15 with severe sepsis found to have liver abscesses status post JP drain x2 and right empyema.  During his hospitalization patient was noted to be acutely confused for which imaging studies of the brain  revealed subdural hematomas requiring burr holes.  He was seen by ID and recommended to continue on Rocephin and oral metronidazole to complete a total of 6 weeks (5/24). Patient was noted to have concern for probable seizures for which he was started on Keppra.  Patient was he went to inpatient rehab from 4/15-5/11.  After that he was able to be discharged from rehab he presented back to the emergency department on 5/12 and again on 5/16 for weakness.  He was now hospitalized on 5/18 for hypoglycemia.  New events last 24 hours / Subjective: Appears to be somewhat depressed.  Laying in bed, no physical complaints today.  Assessment & Plan:   Principal Problem:   Empyema lung ([place]) Active Problems:   Hyperlipidemia, mixed   Essential hypertension   GERD without esophagitis   Hypoglycemia   Liver abscess   Bladder outlet obstruction   Hypokalemia   Hypoglycemia with history of diabetes mellitus type 2, poorly controlled -Hypoglycemia resolved -Continue Levemir, sliding scale insulin -Appreciate diabetic coordinator  Leukocytosis -Blood cultures negative to date -UA negative, urine culture with multiple species -CT A/P: Subtle/questionable edema within the medial cortex of the RIGHT kidney, and new fluid stranding overlying the upper pole of the RIGHT kidney. This may represent mild/early pyelonephritis. Complex/fluid collection at the RIGHT lung base is partially imaged, more completely imaged on earlier abdomen CT of 11/25/2020, compatible with previously described empyema.  -Resolved  Empyema -CT  A/P: Complex/fluid collection at the RIGHT lung base is partially imaged, more completely imaged on earlier abdomen CT of 11/25/2020, compatible with previously described empyema. Consider complete characterization with chest CT. -Chest CT with persistent small right-sided empyema -Appreciate infectious disease -IR consulted, after reviewing images, they feel the empyema is too small  for percutaneous drainage at this time -Continue linezolid  Liver abscesses -Has been status post JP drain x2 -CT A/P: No recurrent abscess -Rocephin and Flagyl discontinued  Recurrent pancreatic cancer -History of Whipple procedure status post chemo and radiation -Continue Creon -Follows with Dr. [name] appreciate her stopping by  Hypotension -Continue midodrine  Seizure disorder -Continue Keppra  History of subdural hematoma status post bur hole 4/12 -Repeat CT head 5/16: No significant change in size or appearance of thin left convexity subdural hematoma without significant mass effect or midline shift. No acute interval change since 12/10/2020.  BPH with chronic indwelling Foley catheter -Continue Flomax -Supposed to follow with outpatient urology for voiding trial   Recent right hip fracture status postrepair by intramedullary nail 09/2020 -PT OT recommending SNF placement    In agreement with assessment of the pressure ulcer as below:  Pressure Injury 11/07/20 Heel Right Deep Tissue Pressure Injury - Purple or maroon localized area of discolored intact skin or blood-filled blister due to damage of underlying soft tissue from pressure and/or shear. (Active)  11/07/20 2000  Location: Heel  Location Orientation: Right  Staging: Deep Tissue Pressure Injury - Purple or maroon localized area of discolored intact skin or blood-filled blister due to damage of underlying soft tissue from pressure and/or shear.  Wound Description (Comments):   Present on Admission:      Pressure Injury 12/15/20 Heel Right Deep Tissue Pressure Injury - Purple or maroon localized area of discolored intact skin or blood-filled blister due to damage of underlying soft tissue from pressure and/or shear. (Active)  12/15/20 1800  Location: Heel  Location Orientation: Right  Staging: Deep Tissue Pressure Injury - Purple or maroon localized area of discolored intact skin or blood-filled blister due to  damage of underlying soft tissue from pressure and/or shear.  Wound Description (Comments):   Present on Admission: Yes         DVT prophylaxis:  enoxaparin (LOVENOX) injection 40 mg Start: 12/15/20 1615  Code Status:     Code Status Orders  (From admission, onward)         Start     Ordered   12/15/20 1609  Full code  Continuous        12/15/20 1609        Code Status History    Date Active Date Inactive Code Status Order ID Comments User Context   11/12/2020 1820 12/08/2020 1821 Full Code [id number]  Cy, Bre[place] Inpatient   11/05/2020 2348 11/12/2020 1756 Full Code [id number]  Vernelle Emerald, MD ED   10/18/2020 2223 10/28/2020 1955 Full Code [id number]  Doran Heater, DO ED   06/19/2017 0232 06/20/2017 2149 Full Code [id number]  Rise Patience, MD ED   09/19/2016 1425 09/25/2016 2116 Full Code [id number]  Stark Klein, MD Inpatient   02/27/2016 2042 03/02/2016 1607 Full Code [id number]  Benito Mccreedy, MD Inpatient   02/27/2016 2042 02/27/2016 2042 Full Code [id number]  Benito Mccreedy, MD Inpatient   08/18/2011 2216 08/23/2011 2339 Full Code [id number]  Hovander, Floreen Comber, RN Inpatient   Advance Care Planning Activity     Family Communication: None at bedside Disposition Plan:  Status  is: Inpatient  Remains inpatient appropriate because:Unsafe d/c plan   Dispo: The patient is from: Home              Anticipated d/c is to: SNF              Patient currently is medically stable to d/c.  SNF placement pending   Difficult to place patient No          Antimicrobials:  Anti-infectives (From admission, onward)   Start     Dose/Rate Route Frequency Ordered Stop   12/17/20 2200  linezolid (ZYVOX) tablet 600 mg        600 mg Oral Every 12 hours 12/17/20 1543 12/21/20 2359   12/15/20 2200  cefTRIAXone (ROCEPHIN) 2 g in sodium chloride 0.9 % 100 mL IVPB  Status:  Discontinued        2 g 200 mL/hr over 30 Minutes Intravenous Every 12 hours 12/15/20  1650 12/17/20 1543   12/15/20 1645  cefTRIAXone (ROCEPHIN) IVPB  Status:  Discontinued        2 g Intravenous Every 12 hours 12/15/20 1631 12/15/20 1645   12/15/20 1645  metroNIDAZOLE (FLAGYL) tablet 500 mg  Status:  Discontinued       Note to Pharmacy: Treat through 12/21/20     500 mg Oral Every 8 hours 12/15/20 1631 12/17/20 1543       Objective: Vitals:   12/19/20 0444 12/19/20 0447 12/19/20 0759 12/19/20 1129  BP: (!) 153/90  (!) 168/95 (!) 159/83  Pulse: 67  68 61  Resp: 18  18 17   Temp: 98 F (36.7 C)  98.4 F (36.9 C) 97.7 F (36.5 C)  TempSrc: Oral  Oral Oral  SpO2: 94%  95% 96%  Weight:  72.1 kg    Height:        Intake/Output Summary (Last 24 hours) at 12/19/2020 1320 Last data filed at 12/19/2020 [id number] Gross per 24 hour  Intake 840 ml  Output 1400 ml  Net -560 ml   Filed Weights   12/16/20 0519 12/18/20 0500 12/19/20 0447  Weight: 73.3 kg 72.6 kg 72.1 kg    Examination: General exam: Appears calm and comfortable, appears to be chronically ill, frail Respiratory system: Clear to auscultation. Respiratory effort normal. Cardiovascular system: S1 & S2 heard, RRR. No pedal edema. Gastrointestinal system: Abdomen is nondistended, soft and nontender. Normal bowel sounds heard. Central nervous system: Alert and oriented. Non focal exam. Speech clear  Extremities: Symmetric in appearance bilaterally  Skin: No rashes, lesions or ulcers on exposed skin  Psychiatry: Judgement and insight appear stable. Mood & affect appropriate.     Data Reviewed: I have personally reviewed following labs and imaging studies  CBC: Recent Labs  Lab 12/13/20 2015 12/15/20 1155 12/15/20 1202 12/16/20 0348 12/17/20 0806 12/18/20 0310 12/19/20 0133  WBC 13.5* 11.6*  --  14.7* 11.3* 10.1 8.0  NEUTROABS 11.1* 10.1*  --   --   --   --   --   HGB 9.7* 10.4* 11.6* 9.8* 10.0* 9.8* 10.4*  HCT 30.4* 32.7* 34.0* 30.4* 31.6* 30.5* 31.8*  MCV 89.9 89.8  --  89.1 90.5 90.2 88.8  PLT 361  374  --  342 351 382 [id number]   Basic Metabolic Panel: Recent Labs  Lab 12/15/20 1155 12/15/20 1202 12/15/20 1610 12/16/20 0348 12/17/20 0806 12/18/20 0310 12/19/20 0133  NA 139 140  --  135 136 135 135  K 2.6* 2.7*  --  3.5 2.9* 3.9 3.7  CL 101 98  --  100 102 100 100  CO2 31  --   --  27 30 29 28   GLUCOSE 89 84  --  286* 70 136* 190*  BUN 10 11  --  7* 5* 6* 7*  CREATININE 0.47* 0.30*  --  0.59* 0.37* 0.54* 0.54*  CALCIUM 8.3*  --   --  8.4* 8.3* 8.6* 8.4*  MG  --   --  2.3  --   --   --   --    GFR: Estimated Creatinine Clearance: 91.4 mL/min (A) (by C-G formula based on SCr of 0.54 mg/dL (L)). Liver Function Tests: Recent Labs  Lab 12/13/20 2015 12/15/20 1155  AST 46* 21  ALT 34 26  ALKPHOS 1,538* 1,159*  BILITOT 0.4 0.7  PROT 6.8 6.9  ALBUMIN 2.8* 2.7*   No results for input(s): LIPASE, AMYLASE in the last 168 hours. No results for input(s): AMMONIA in the last 168 hours. Coagulation Profile: Recent Labs  Lab 12/15/20 1155  INR 1.2   Cardiac Enzymes: No results for input(s): CKTOTAL, CKMB, CKMBINDEX, TROPONINI in the last 168 hours. BNP (last 3 results) No results for input(s): PROBNP in the last 8760 hours. HbA1C: No results for input(s): HGBA1C in the last 72 hours. CBG: Recent Labs  Lab 12/18/20 1124 12/18/20 1630 12/18/20 2102 12/19/20 0558 12/19/20 1137  GLUCAP 218* 254* 236* 186* 236*   Lipid Profile: No results for input(s): CHOL, HDL, LDLCALC, TRIG, CHOLHDL, LDLDIRECT in the last 72 hours. Thyroid Function Tests: No results for input(s): TSH, T4TOTAL, FREET4, T3FREE, THYROIDAB in the last 72 hours. Anemia Panel: No results for input(s): VITAMINB12, FOLATE, FERRITIN, TIBC, IRON, RETICCTPCT in the last 72 hours. Sepsis Labs: Recent Labs  Lab 12/15/20 1158 12/15/20 1814  LATICACIDVEN 0.9 1.8    Recent Results (from the past 240 hour(s))  Resp Panel by RT-PCR (Flu A&B, Covid) Nasopharyngeal Swab     Status: None   Collection Time:  12/10/20  9:30 AM   Specimen: Nasopharyngeal Swab; Nasopharyngeal(NP) swabs in vial transport medium  Result Value Ref Range Status   SARS Coronavirus 2 by RT PCR NEGATIVE NEGATIVE Final    Comment: (NOTE) SARS-CoV-2 target nucleic acids are NOT DETECTED.  The SARS-CoV-2 RNA is generally detectable in upper respiratory specimens during the acute phase of infection. The lowest concentration of SARS-CoV-2 viral copies this assay can detect is 138 copies/mL. A negative result does not preclude SARS-Cov-2 infection and should not be used as the sole basis for treatment or other patient management decisions. A negative result may occur with  improper specimen collection/handling, submission of specimen other than nasopharyngeal swab, presence of viral mutation(s) within the areas targeted by this assay, and inadequate number of viral copies(<138 copies/mL). A negative result must be combined with clinical observations, patient history, and epidemiological information. The expected result is Negative.  Fact Sheet for Patients:  EntrepreneurPulse.com.au  Fact Sheet for Healthcare Providers:  IncredibleEmployment.be  This test is no t yet approved or cleared by the [place] FDA and  has been authorized for detection and/or diagnosis of SARS-CoV-2 by FDA under an Emergency Use Authorization (EUA). This EUA will remain  in effect (meaning this test can be used) for the duration of the COVID-19 declaration under Section 564(b)(1) of the Act, 21 U.S.C.section 360bbb-3(b)(1), unless the authorization is terminated  or revoked sooner.       Influenza A by PCR NEGATIVE NEGATIVE Final   Influenza B by PCR NEGATIVE  NEGATIVE Final    Comment: (NOTE) The Xpert Xpress SARS-CoV-2/FLU/RSV plus assay is intended as an aid in the diagnosis of influenza from Nasopharyngeal swab specimens and should not be used as a sole basis for treatment. Nasal washings  and aspirates are unacceptable for Xpert Xpress SARS-CoV-2/FLU/RSV testing.  Fact Sheet for Patients: EntrepreneurPulse.com.au  Fact Sheet for Healthcare Providers: IncredibleEmployment.be  This test is not yet approved or cleared by the [place] FDA and has been authorized for detection and/or diagnosis of SARS-CoV-2 by FDA under an Emergency Use Authorization (EUA). This EUA will remain in effect (meaning this test can be used) for the duration of the COVID-19 declaration under Section 564(b)(1) of the Act, 21 U.S.C. section 360bbb-3(b)(1), unless the authorization is terminated or revoked.  Performed at [facility], [place] [address], [place], [place] [address]   Resp Panel by RT-PCR (Flu A&B, Covid) Nasopharyngeal Swab     Status: None   Collection Time: 12/15/20 11:52 AM   Specimen: Nasopharyngeal Swab; Nasopharyngeal(NP) swabs in vial transport medium  Result Value Ref Range Status   SARS Coronavirus 2 by RT PCR NEGATIVE NEGATIVE Final    Comment: (NOTE) SARS-CoV-2 target nucleic acids are NOT DETECTED.  The SARS-CoV-2 RNA is generally detectable in upper respiratory specimens during the acute phase of infection. The lowest concentration of SARS-CoV-2 viral copies this assay can detect is 138 copies/mL. A negative result does not preclude SARS-Cov-2 infection and should not be used as the sole basis for treatment or other patient management decisions. A negative result may occur with  improper specimen collection/handling, submission of specimen other than nasopharyngeal swab, presence of viral mutation(s) within the areas targeted by this assay, and inadequate number of viral copies(<138 copies/mL). A negative result must be combined with clinical observations, patient history, and epidemiological information. The expected result is Negative.  Fact Sheet for Patients:  EntrepreneurPulse.com.au  Fact Sheet  for Healthcare Providers:  IncredibleEmployment.be  This test is no t yet approved or cleared by the [place] FDA and  has been authorized for detection and/or diagnosis of SARS-CoV-2 by FDA under an Emergency Use Authorization (EUA). This EUA will remain  in effect (meaning this test can be used) for the duration of the COVID-19 declaration under Section 564(b)(1) of the Act, 21 U.S.C.section 360bbb-3(b)(1), unless the authorization is terminated  or revoked sooner.       Influenza A by PCR NEGATIVE NEGATIVE Final   Influenza B by PCR NEGATIVE NEGATIVE Final    Comment: (NOTE) The Xpert Xpress SARS-CoV-2/FLU/RSV plus assay is intended as an aid in the diagnosis of influenza from Nasopharyngeal swab specimens and should not be used as a sole basis for treatment. Nasal washings and aspirates are unacceptable for Xpert Xpress SARS-CoV-2/FLU/RSV testing.  Fact Sheet for Patients: EntrepreneurPulse.com.au  Fact Sheet for Healthcare Providers: IncredibleEmployment.be  This test is not yet approved or cleared by the [place] FDA and has been authorized for detection and/or diagnosis of SARS-CoV-2 by FDA under an Emergency Use Authorization (EUA). This EUA will remain in effect (meaning this test can be used) for the duration of the COVID-19 declaration under Section 564(b)(1) of the Act, 21 U.S.C. section 360bbb-3(b)(1), unless the authorization is terminated or revoked.  Performed at [facility], [place] [address], [place], [place] [address]   Culture, blood (routine x 2)     Status: None (Preliminary result)   Collection Time: 12/15/20 12:00 PM   Specimen: BLOOD LEFT HAND  Result  Value Ref Range Status   Specimen Description BLOOD LEFT HAND  Final   Special Requests   Final    BOTTLES DRAWN AEROBIC AND ANAEROBIC Blood Culture results may not be optimal due to an inadequate volume of blood received in culture  bottles   Culture   Final    NO GROWTH 4 DAYS Performed at [place] [address], [place], [place] [address]    Report Status PENDING  Incomplete  Culture, blood (routine x 2)     Status: None (Preliminary result)   Collection Time: 12/15/20 12:06 PM   Specimen: BLOOD RIGHT HAND  Result Value Ref Range Status   Specimen Description BLOOD RIGHT HAND  Final   Special Requests   Final    BOTTLES DRAWN AEROBIC AND ANAEROBIC Blood Culture results may not be optimal due to an inadequate volume of blood received in culture bottles   Culture   Final    NO GROWTH 4 DAYS Performed at [facility], [place] [address], [place], [place] [address]    Report Status PENDING  Incomplete  Culture, Urine     Status: Abnormal   Collection Time: 12/16/20  2:24 PM   Specimen: Urine, Random  Result Value Ref Range Status   Specimen Description URINE, RANDOM  Final   Special Requests   Final    NONE Performed at [facility], [place] [address] [place], [place] [address]    Culture MULTIPLE SPECIES PRESENT, SUGGEST RECOLLECTION (A)  Final   Report Status 12/17/2020 FINAL  Final      Radiology Studies: No results found.    Scheduled Meds: . Chlorhexidine Gluconate Cloth  6 each Topical Daily  . citalopram  20 mg Oral Daily  . diclofenac Sodium  2 g Topical QID  . enoxaparin (LOVENOX) injection  40 mg Subcutaneous Q24H  . hydrocerin  1 application Topical BID  . hydrocortisone cream   Topical BID  . insulin aspart  0-9 Units Subcutaneous TID WC  . insulin aspart  3 Units Subcutaneous TID WC  . insulin detemir  10 Units Subcutaneous Daily  . levETIRAcetam  1,000 mg Oral BID  . lidocaine  1 patch Transdermal Q24H  . linezolid  600 mg Oral Q12H  . lipase/protease/amylase  24,000 Units Oral TID AC  . midodrine  5 mg Oral TID WC  . pantoprazole  40 mg Oral Daily  . sodium chloride flush  3 mL Intravenous Q12H  . tamsulosin  0.8 mg Oral Daily   Continuous Infusions:     LOS: 3 days      Time spent: 20 minutes   Dessa Phi, DO [facility] 12/19/2020, 1:20 PM   Available via Epic secure chat 7am-7pm After these hours, please refer to coverage provider listed on [email or web address]

## 2020-12-19 NOTE — TOC Progression Note (Signed)
Transition of Care [facility]) - Progression Note    Patient Details  Name: Colton Bush MRN: [id number] Date of Birth: 10/20/1955  Transition of Care [facility]) CM/SW [place], [place] Phone Number: [phone] 12/19/2020, 10:45 AM  Clinical Narrative:     CSW spoke with pt is regards to the 2 bed offers that have come back [name] and [facility]). Pt asked CSW about the facility ratings and CSW provided the ratings. Pt has requested to wait until tomorrow to see if any more bed offers come back. CSW provided medicare.gov listing with the 2 bed offers highlighted.  TOC team will continue to assist with discharge planning needs.   Expected Discharge Plan: [place] Barriers to Discharge: Continued Medical Work up,SNF Pending bed offer  Expected Discharge Plan and Services Expected Discharge Plan: [place] Choice: [place] arrangements for the past 2 months: Single Family Home                                       Social Determinants of Health (SDOH) Interventions    Readmission Risk Interventions No flowsheet data found.

## 2020-12-19 NOTE — Progress Notes (Signed)
Pt is increasingly agitated with home situation, he states he feels like he is being secluded and controlled to only have one visitor but is unable to contact other friends and family due to spouse.  Pt educated on visitation policy  Pt feels hopeless and stuck in situation. Pt feels "lonely"   And does not want to return home to spouse.  RN offered reassurance to pt

## 2020-12-19 NOTE — Progress Notes (Signed)
Chaplain responded to request from patient's nurse that patient was depressed and would like to talk to the [place]. Chaplain sat by patient's beside and listened as he talked about being frustrated and ready to give up. Patient remarked "I may as well be dead." He said he has no help from his wife. She does not want to help care for him and made remarks that she would not be driving him around with a wheelchair and she would not be taking him anywhere with a [place] hanging out. Wife has made patient feel useless and unloved. He has been in 2 rehabs and is going to a skilled nursing facility. The two that have offered beds are not good places according to patient and he wants to wait and see if anyone else offers tomorrow. Patient is afraid that his wife is going to "take whatever he has left and will try to get his SS check." Chaplain told him that he needs to speak with his SW about all this and also needs to speak with a psychiatrist. Bonney Roussel said that she would speak with the nurse about getting the psychiatrist to see him before he is transferred to the SNF. My heart goes out to this patient because he is really in a dark place right now. We spoke about his faith and he said that his wife wouldn't let him go to any church but hers and they haven't been to hers in "years and years." He said he would like to go to church. He said his inability to drive prevents him from doing anything. His wife has to take him and she would not take him to church. I am hopeful that SW and psychiatrist will be able to give him some peace and relief from all that is going on in his head.    12/19/20 1500  Clinical Encounter Type  Visited With Patient;Health care provider  Visit Type Initial;Spiritual support  Referral From Nurse  Consult/Referral To Chaplain  Spiritual Encounters  Spiritual Needs Prayer;Emotional  Stress Factors  Patient Stress Factors Major life changes;Lack of caregivers;Family  relationships;Financial concerns  Family Stress Factors Major life changes

## 2020-12-20 ENCOUNTER — Ambulatory Visit: Payer: Medicare Other | Admitting: Radiation Oncology

## 2020-12-20 ENCOUNTER — Other Ambulatory Visit: Payer: Self-pay

## 2020-12-20 DIAGNOSIS — C250 Malignant neoplasm of head of pancreas: Secondary | ICD-10-CM

## 2020-12-20 DIAGNOSIS — J869 Pyothorax without fistula: Secondary | ICD-10-CM | POA: Diagnosis not present

## 2020-12-20 LAB — BASIC METABOLIC PANEL
Anion gap: 4 — ABNORMAL LOW (ref 5–15)
BUN: 7 mg/dL — ABNORMAL LOW (ref 8–23)
CO2: 30 mmol/L (ref 22–32)
Calcium: 8.5 mg/dL — ABNORMAL LOW (ref 8.9–10.3)
Chloride: 101 mmol/L (ref 98–111)
Creatinine, Ser: 0.61 mg/dL (ref 0.61–1.24)
GFR, Estimated: >60 mL/min (ref >60)
Glucose, Bld: 232 mg/dL — ABNORMAL HIGH (ref 70–99)
Potassium: 3.6 mmol/L (ref 3.5–5.1)
Sodium: 135 mmol/L (ref 135–145)

## 2020-12-20 LAB — CULTURE, BLOOD (ROUTINE X 2)
Culture: NO GROWTH
Culture: NO GROWTH

## 2020-12-20 LAB — CBC
HCT: 32.8 % — ABNORMAL LOW (ref 39.0–52.0)
Hemoglobin: 10.8 g/dL — ABNORMAL LOW (ref 13.0–17.0)
MCH: 29.2 pg (ref 26.0–34.0)
MCHC: 32.9 g/dL (ref 30.0–36.0)
MCV: 88.6 fL (ref 80.0–100.0)
Platelets: 394 10*3/uL (ref 150–400)
RBC: 3.7 MIL/uL — ABNORMAL LOW (ref 4.22–5.81)
RDW: 18.7 % — ABNORMAL HIGH (ref 11.5–15.5)
WBC: 6.9 10*3/uL (ref 4.0–10.5)
nRBC: 0 % (ref 0.0–0.2)

## 2020-12-20 LAB — GLUCOSE, CAPILLARY
Glucose-Capillary: 265 mg/dL — ABNORMAL HIGH (ref 70–99)
Glucose-Capillary: 230 mg/dL — ABNORMAL HIGH (ref 70–99)
Glucose-Capillary: 144 mg/dL — ABNORMAL HIGH (ref 70–99)
Glucose-Capillary: 112 mg/dL — ABNORMAL HIGH (ref 70–99)

## 2020-12-20 LAB — SARS CORONAVIRUS 2 (TAT 6-24 HRS): SARS Coronavirus 2: NEGATIVE

## 2020-12-20 MED ORDER — CITALOPRAM HYDROBROMIDE 20 MG PO TABS
40.0000 mg | ORAL_TABLET | Freq: Every day | ORAL | Status: DC
  Administered 2020-12-20 – 2020-12-22 (×3): 40 mg via ORAL
  Filled 2020-12-20 (×3): qty 2

## 2020-12-20 MED ORDER — LINEZOLID 600 MG PO TABS
600.0000 mg | ORAL_TABLET | Freq: Two times a day (BID) | ORAL | Status: DC
  Administered 2020-12-20 – 2020-12-22 (×4): 600 mg via ORAL
  Filled 2020-12-20 (×5): qty 1

## 2020-12-20 NOTE — TOC Progression Note (Signed)
Transition of Care [facility]) - Progression Note    Patient Details  Name: Colton Bush MRN: [id number] Date of Birth: 04/08/1956  Transition of Care [facility]) CM/SW [place], [place] Phone Number: 12/20/2020, 12:55 PM  Clinical Narrative:    62: CSW contacted pt wife to discuss pt SNF options, she is otw to visit and wants to speak with pt about which facility they want and will follow up with CSW. Pt wife stated they do NOT want [facility], CSW will follow up.   Expected Discharge Plan: [place] Barriers to Discharge: Continued Medical Work up,SNF Pending bed offer  Expected Discharge Plan and Services Expected Discharge Plan: [place] Choice: [place] arrangements for the past 2 months: Single Family Home                                       Social Determinants of Health (SDOH) Interventions    Readmission Risk Interventions No flowsheet data found.

## 2020-12-20 NOTE — Progress Notes (Signed)
Physical Therapy Treatment Patient Details Name: Colton Bush MRN: [id number] DOB: Jul 03, 1956 Today's Date: 12/20/2020    History of Present Illness Pt is a 65 y.o. male admitted 12/15/20 found unresponsive by wife, pt hypoglycemic. Head CT negative for acute changes. CXR with known R pleural effusion. Workup for hypoglycemia, hypokalemia. Of note, pt with recent admission 11/05/20 with sepsis s/p burr hole evacuation of L SDH, with CIR admission 11/12/20-12/08/20, recent ED visits 5/12 and 5/16 for weakness. Other PMH includes HTN, hernia, scoliosis, ACDF (2013), R femur fx (09/2020).    PT Comments    Patient not progressing this session secondary to lightheadedness with standing feeling like he might pass out. Requires Min A to power to standing on multiple attempts and tolerated marching in place and taking a few steps forwards/backwards with Min guard-Min A and use of RW. Supine BP (taken prior to PT arrival) 154/92, sitting BP 106/57, sitting BP (after standing for 30 seconds) 101/63, sitting BP after second stand 98/73, symptomatic each time standing. Pt more animated today when talking about his coin collection. Continue to recommend SNF and encouraged OOB to chair for all meals. Will follow and progress as able.    Follow Up Recommendations  SNF;Supervision for mobility/OOB     Equipment Recommendations  None recommended by PT    Recommendations for Other Services       Precautions / Restrictions Precautions Precautions: Fall;Other (comment) Precaution Comments: watch BP Restrictions Weight Bearing Restrictions: No    Mobility  Bed Mobility Overal bed mobility: Needs Assistance Bed Mobility: Supine to Sit;Sit to Supine     Supine to sit: Supervision;HOB elevated Sit to supine: Supervision;HOB elevated   General bed mobility comments: Increased time and effort, use of bed rail    Transfers Overall transfer level: Needs assistance Equipment used: Rolling walker (2  wheeled) Transfers: Sit to/from Stand Sit to Stand: Min assist         General transfer comment: ASsist to power to standing with cues for hand placement as pt attempting to pull up on RW handles. + lightheadedness in standing, eyes closing slowly. Stood from EOB x4, limited standing tolerance due to lightheadedness.  Ambulation/Gait Ambulation/Gait assistance: Min assist Gait Distance (Feet): 4 Feet (forwards/backwards) Assistive device: Rolling walker (2 wheeled)   Gait velocity: decreased   General Gait Details: Able to take a few steps forward/backwards with close min guard- limited due to lightheadedness and feeling like he might pass out. See BPs in balance section.   Stairs             Wheelchair Mobility    Modified Rankin (Stroke Patients Only)       Balance Overall balance assessment: Needs assistance Sitting-balance support: Feet supported;No upper extremity supported Sitting balance-[name]: Fair Sitting balance - Comments: supervision for safety.   Standing balance support: During functional activity;Bilateral upper extremity supported Standing balance-[name]: Poor Standing balance comment: Heavy reliance on UE support and close Min guard due to lightheadedness, marching in place x20                            Cognition Arousal/Alertness: Awake/alert Behavior During Therapy: [facility] for tasks assessed/performed;Flat affect Overall Cognitive Status: No family/caregiver present to determine baseline cognitive functioning                                 General Comments: Appears  appropriate for basic mobility tasks however does need repetition at times to follow motor commands that are multi step.      Exercises      General Comments General comments (skin integrity, edema, etc.): Supine BP (taken prior to PT arrival) 154/92, sitting BP 106/57, sitting BP (after standing for 30 seconds) 101/63, sitting BP after second  stand 98/73, symptomatic each time standing.      Pertinent Vitals/Pain Pain Assessment: No/denies pain    Home Living                      Prior Function            PT Goals (current goals can now be found in the care plan section) Progress towards PT goals: Not progressing toward goals - comment (due to lightheadedness with standing)    Frequency    Min 2X/week      PT Plan Current plan remains appropriate    Co-evaluation              AM-PAC PT "6 Clicks" Mobility   Outcome Measure  Help needed turning from your back to your side while in a flat bed without using bedrails?: None Help needed moving from lying on your back to sitting on the side of a flat bed without using bedrails?: None Help needed moving to and from a bed to a chair (including a wheelchair)?: A Little Help needed standing up from a chair using your arms (e.g., wheelchair or bedside chair)?: A Little Help needed to walk in hospital room?: A Little Help needed climbing 3-5 steps with a railing? : A Little 6 Click Score: 20    End of Session Equipment Utilized During Treatment: Gait belt Activity Tolerance: Treatment limited secondary to medical complications (Comment) (lightheadedness) Patient left: in bed;with call bell/phone within reach;with bed alarm set;Other (comment) (Prevalon boots donned) Nurse Communication: Mobility status PT Visit Diagnosis: Other abnormalities of gait and mobility (R26.89);Muscle weakness (generalized) (M62.81)     Time: 5621-3086 PT Time Calculation (min) (ACUTE ONLY): 29 min  Charges:  $Therapeutic Activity: 23-37 mins                     Colton Bush, PT, DPT Acute Rehabilitation Services Pager [phone] Office [phone]       Colton Bush 12/20/2020, 12:45 PM

## 2020-12-20 NOTE — Progress Notes (Signed)
Inpatient Diabetes Program Recommendations  AACE/ADA: New Consensus Statement on Inpatient Glycemic Control (2015)  Target Ranges:  Prepandial:   less than 140 mg/dL      Peak postprandial:   less than 180 mg/dL (1-2 hours)      Critically ill patients:  140 - 180 mg/dL   Lab Results  Component Value Date   GLUCAP 144 (H) 12/20/2020   HGBA1C 10.8 (H) 10/18/2020    Review of Glycemic Control Results for Colton Bush, Colton "[name]" (MRN [id number]) as of 12/20/2020 18:29  Ref. Range 12/19/2020 16:56 12/19/2020 21:20 12/20/2020 06:07 12/20/2020 12:07 12/20/2020 16:58  Glucose-Capillary Latest Ref Range: 70 - 99 mg/dL 249 (H) 235 (H) 265 (H) 230 (H) 144 (H)     Inpatient Diabetes Program Recommendations:    Levemir 14 units daliy QAM.  Will continue to follow while inpatient.  Thank you, Reche Dixon, RN, BSN Diabetes Coordinator Inpatient Diabetes Program [phone] (team pager from 8a-5p)

## 2020-12-20 NOTE — Progress Notes (Signed)
[facility] Infectious Diseases Follow Up Note  Patient Identification: Patient Name: Colton Bush MRN: [id number] [place] Date: 12/15/2020 11:34 AM Age: 65 y.o.Today's Date: 12/20/2020   Reason for Visit: Empyema  Principal Problem:   Empyema lung [facility]) Active Problems:   Hyperlipidemia, mixed   Essential hypertension   GERD without esophagitis   Hypoglycemia   Liver abscess   Bladder outlet obstruction   Hypokalemia   Antibiotics:  Ceftriaxone 5/18-5/20 Metronidazole 5/18-5/20 Linezolid 5/20 - current   Lines/Tubes: Right arm PICC line, PIV's  Interval Events: Continues to remain afebrile, leukocytosis, hemo-dynamically stable   Assessment RT lung Empyema secondary to VRE -not amenable for drainage per  IR  Subtle/questionable edema within the medial cortex of the right kidney and new fluid stranding overlying the upper pole of the right kidney ?  Mild/early pyelonephritis.  UA unremarkable.  Urine culture with multiple species.  Patient recently completed a prolonged course of ceftriaxone and Flagyl on 5/20  Polymicrobial liver abscess/Streptococcus anginosus bacteremia status post completion of treatment.  TEE with no vegetation  Left subdural hematoma s/p burr hole craniotomy   Recommendations Continue linezolid as is.  We will plan to treat for 4 weeks from 5/19 Repeat CT chest when approaching around 3 weeks to make sure empyema is resolved Would recommend to monitor CBCs while on linezolid weekly to look for cytopenias ID will sign off for now. Please call with questions   Rest of the management as per the primary team. Thank you for the consult. Please page with pertinent questions or concerns.  ______________________________________________________________________ Subjective patient seen and examined at the bedside.  Lying in bed, appears to be comfortable, not in acute distress.  Minimal cough.  Denies  any shortness of breath or chest pain  Vitals BP (!) 154/92   Pulse 86   Temp 97.9 F (36.6 C) (Oral)   Resp (!) 24   Ht 5\' 11"  (1.803 m)   Wt 71.4 kg   SpO2 94%   BMI 21.95 kg/m     Physical Exam Constitutional:   Lying in bed, on room air    Comments:   Cardiovascular:     Rate and Rhythm: Normal rate and regular rhythm.     Heart sounds: No murmur heard.   Pulmonary:     Effort: Pulmonary effort is normal.     Comments: Decreased air entry in the bases, crepitations present  Abdominal:     Palpations: Abdomen is soft.     Tenderness: Nondistended and nontender  Musculoskeletal:        General: No swelling or tenderness.   Skin:    Comments: No obvious rashes  Neurological:     General: No focal deficit present.   Psychiatric:        Mood and Affect: Mood normal.   Pertinent Microbiology Results for orders placed or performed during the hospital encounter of 12/15/20  Resp Panel by RT-PCR (Flu A&B, Covid) Nasopharyngeal Swab     Status: None   Collection Time: 12/15/20 11:52 AM   Specimen: Nasopharyngeal Swab; Nasopharyngeal(NP) swabs in vial transport medium  Result Value Ref Range Status   SARS Coronavirus 2 by RT PCR NEGATIVE NEGATIVE Final    Comment: (NOTE) SARS-CoV-2 target nucleic acids are NOT DETECTED.  The SARS-CoV-2 RNA is generally detectable in upper respiratory specimens during the acute phase of infection. The lowest concentration of SARS-CoV-2 viral copies this assay can detect is 138 copies/mL. A negative result does not preclude SARS-Cov-2 infection  and should not be used as the sole basis for treatment or other patient management decisions. A negative result may occur with  improper specimen collection/handling, submission of specimen other than nasopharyngeal swab, presence of viral mutation(s) within the areas targeted by this assay, and inadequate number of viral copies(<138 copies/mL). A negative result must be combined  with clinical observations, patient history, and epidemiological information. The expected result is Negative.  Fact Sheet for Patients:  EntrepreneurPulse.com.au  Fact Sheet for Healthcare Providers:  IncredibleEmployment.be  This test is no t yet approved or cleared by the [place] FDA and  has been authorized for detection and/or diagnosis of SARS-CoV-2 by FDA under an Emergency Use Authorization (EUA). This EUA will remain  in effect (meaning this test can be used) for the duration of the COVID-19 declaration under Section 564(b)(1) of the Act, 21 U.S.C.section 360bbb-3(b)(1), unless the authorization is terminated  or revoked sooner.       Influenza A by PCR NEGATIVE NEGATIVE Final   Influenza B by PCR NEGATIVE NEGATIVE Final    Comment: (NOTE) The Xpert Xpress SARS-CoV-2/FLU/RSV plus assay is intended as an aid in the diagnosis of influenza from Nasopharyngeal swab specimens and should not be used as a sole basis for treatment. Nasal washings and aspirates are unacceptable for Xpert Xpress SARS-CoV-2/FLU/RSV testing.  Fact Sheet for Patients: EntrepreneurPulse.com.au  Fact Sheet for Healthcare Providers: IncredibleEmployment.be  This test is not yet approved or cleared by the [place] FDA and has been authorized for detection and/or diagnosis of SARS-CoV-2 by FDA under an Emergency Use Authorization (EUA). This EUA will remain in effect (meaning this test can be used) for the duration of the COVID-19 declaration under Section 564(b)(1) of the Act, 21 U.S.C. section 360bbb-3(b)(1), unless the authorization is terminated or revoked.  Performed at [facility], [place] [address] [place], [place] [address]   Culture, blood (routine x 2)     Status: None   Collection Time: 12/15/20 12:00 PM   Specimen: BLOOD LEFT HAND  Result Value Ref Range Status   Specimen Description BLOOD LEFT  HAND  Final   Special Requests   Final    BOTTLES DRAWN AEROBIC AND ANAEROBIC Blood Culture results may not be optimal due to an inadequate volume of blood received in culture bottles   Culture   Final    NO GROWTH 5 DAYS Performed at [facility], [place] [address], [place], [place] [address]    Report Status 12/20/2020 FINAL  Final  Culture, blood (routine x 2)     Status: None   Collection Time: 12/15/20 12:06 PM   Specimen: BLOOD RIGHT HAND  Result Value Ref Range Status   Specimen Description BLOOD RIGHT HAND  Final   Special Requests   Final    BOTTLES DRAWN AEROBIC AND ANAEROBIC Blood Culture results may not be optimal due to an inadequate volume of blood received in culture bottles   Culture   Final    NO GROWTH 5 DAYS Performed at [facility], [place] [address] [place], [place] [address]    Report Status 12/20/2020 FINAL  Final  Culture, Urine     Status: Abnormal   Collection Time: 12/16/20  2:24 PM   Specimen: Urine, Random  Result Value Ref Range Status   Specimen Description URINE, RANDOM  Final   Special Requests   Final    NONE Performed at [facility], [place] [address], [place], [place] [address]  Culture MULTIPLE SPECIES PRESENT, SUGGEST RECOLLECTION (A)  Final   Report Status 12/17/2020 FINAL  Final   Pertinent labs   CBC Latest Ref Rng & Units 12/20/2020 12/19/2020 12/18/2020  WBC 4.0 - 10.5 K/uL 6.9 8.0 10.1  Hemoglobin 13.0 - 17.0 g/dL 10.8(L) 10.4(L) 9.8(L)  Hematocrit 39.0 - 52.0 % 32.8(L) 31.8(L) 30.5(L)  Platelets 150 - 400 K/uL 394 393 382   CMP Latest Ref Rng & Units 12/20/2020 12/19/2020 12/18/2020  Glucose 70 - 99 mg/dL 232(H) 190(H) 136(H)  BUN 8 - 23 mg/dL 7(L) 7(L) 6(L)  Creatinine 0.61 - 1.24 mg/dL 0.61 0.54(L) 0.54(L)  Sodium 135 - 145 mmol/L 135 135 135  Potassium 3.5 - 5.1 mmol/L 3.6 3.7 3.9  Chloride 98 - 111 mmol/L 101 100 100  CO2 22 - 32 mmol/L 30 28 29   Calcium 8.9 - 10.3 mg/dL 8.5(L) 8.4(L) 8.6(L)  Total Protein 6.5 -  8.1 g/dL - - -  Total Bilirubin 0.3 - 1.2 mg/dL - - -  Alkaline Phos 38 - 126 U/L - - -  AST 15 - 41 U/L - - -  ALT 0 - 44 U/L - - -    Pertinent Imaging today Plain films and CT images have been personally visualized and interpreted; radiology reports have been reviewed. Decision making incorporated into the Impression / Recommendations.  I have spent more than 35 minutes for this patient encounter including review of prior medical records, coordination of care  with greater than 50% of time being face to face/counseling and discussing diagnostics/treatment plan with the patient/family.  Electronically signed by:   Rosiland Oz, MD Infectious Disease Physician [facility] Pager: [phone]

## 2020-12-20 NOTE — Consult Note (Signed)
   [facility] Inpatient Consult   12/20/2020  LEELAN RAJEWSKI [birth year] [id number]   .[facility] [ACO] Patient: Medicare CMS DCE  Patient screened for recent referral from inpatient rehab team at previous, now with 4  Hospitalization within the past 6 months. Patient noted extreme high risk score for unplanned readmission risk and to assess for potential [facility] service needs for post hospital transition.  Review of patient's medical record reveals patient is being recommended for a skilled nursing facility stay.   Plan:  Continue to follow progress and disposition to assess for post hospital care management needs.    For questions contact:   Natividad Brood, RN BSN [facility] Liaison  [phone] business mobile phone Toll free office [phone]  Fax number: [phone] [email or web address]

## 2020-12-20 NOTE — Progress Notes (Signed)
PROGRESS NOTE    Colton Bush  [id number] DOB: 10-03-1955 DOA: 12/15/2020 PCP: [name], MD     Brief Narrative:  Colton Bush is a 65 y.o. male with medical history significant of hypertension, hyperlipidemia, recurrent pancreatic cancer who presents after his wife found him unresponsive this morning.  Most of the history is obtained from the patient's wife who is present at bedside.  She states that normally he is up before she is, but this morning he was still in bed.  After little while she went and checked on him and noticed that he was drooling of the side of his mouth and had sonorous respirations.  Denied seeing any seizure-like activity.  Attempted multiple times to wake him and thereafter called 911.  They found his blood sugar to be around 41.  Patient had last taken Levemir approximately 20 units the night before, but did not eat dinner.  Since he had been home from rehab he reportedly had been eating regularly.  However, patient reports that he recently he had just started feeling bad over the last 2 to 3 days.  Wife also reports that over the last 2 to 3 days his blood sugars that intermittently been dropping into the 60-70s at home.  After he was discharged from rehab he reported that they sent him home on MS Contin as well as hydrocodone, but he did not like the way the made him feel and has not really taken it.  Patient just recently been hospitalized for right hip fracture status post intramedullary nail in 09/2020. His hospital course was complicated E. coli UTI along with strep anginosis bacteremia in 1 of 4 blood cultures treated with Rocephin and then amoxicillin for total of 7 days.  Patient was sent to a rehab facility, but subsequently had to be hospitalized from 4/8-4/15 with severe sepsis found to have liver abscesses status post JP drain x2 and right empyema.  During his hospitalization patient was noted to be acutely confused for which imaging studies of the brain  revealed subdural hematomas requiring burr holes.  He was seen by ID and recommended to continue on Rocephin and oral metronidazole to complete a total of 6 weeks (5/24). Patient was noted to have concern for probable seizures for which he was started on Keppra.  Patient was he went to inpatient rehab from 4/15-5/11.  After that he was able to be discharged from rehab he presented back to the emergency department on 5/12 and again on 5/16 for weakness.  He was now hospitalized on 5/18 for hypoglycemia.  New events last 24 hours / Subjective: Patient reported to RN and to chaplain regarding his depressed mood.  Overall due to his critical illnesses in the past few months, patient feels depressed.  I discussed with him starting an antidepressant which she is open to.  He is on Celexa 20 mg at this time, I will increase this.  He also really wants to get his Foley catheter taken out.  We discussed inpatient voiding trial, possibility of requiring reinsertion of Foley catheter if he continues to have urinary retention versus outpatient urology follow-up.  He wanted to do a voiding trial in the hospital.  Assessment & Plan:   Principal Problem:   Empyema lung ([place]) Active Problems:   Hyperlipidemia, mixed   Essential hypertension   GERD without esophagitis   Hypoglycemia   Liver abscess   Bladder outlet obstruction   Hypokalemia   Hypoglycemia with history of diabetes  mellitus type 2, poorly controlled -Hypoglycemia resolved -Continue Levemir, sliding scale insulin -Appreciate diabetic coordinator  Leukocytosis -Blood cultures negative to date -UA negative, urine culture with multiple species -CT A/P: Subtle/questionable edema within the medial cortex of the RIGHT kidney, and new fluid stranding overlying the upper pole of the RIGHT kidney. This may represent mild/early pyelonephritis. Complex/fluid collection at the RIGHT lung base is partially imaged, more completely imaged on earlier  abdomen CT of 11/25/2020, compatible with previously described empyema.  -Resolved  Empyema -CT A/P: Complex/fluid collection at the RIGHT lung base is partially imaged, more completely imaged on earlier abdomen CT of 11/25/2020, compatible with previously described empyema. Consider complete characterization with chest CT. -Chest CT with persistent small right-sided empyema -Appreciate infectious disease -IR consulted, after reviewing images, they feel the empyema is too small for percutaneous drainage at this time -Continue linezolid  Liver abscesses -Has been status post JP drain x2 -CT A/P: No recurrent abscess -Rocephin and Flagyl discontinued  Recurrent pancreatic cancer -History of Whipple procedure status post chemo and radiation -Continue Creon -Follows with Dr. [name] appreciate her stopping by  Hypotension -Continue midodrine  Seizure disorder -Continue Keppra  History of subdural hematoma status post bur hole 4/12 -Repeat CT head 5/16: No significant change in size or appearance of thin left convexity subdural hematoma without significant mass effect or midline shift. No acute interval change since 12/10/2020.  BPH with chronic indwelling Foley catheter -Continue Flomax -Supposed to follow with outpatient urology for voiding trial  -Remove Foley for voiding trial per patient's request  Recent right hip fracture status postrepair by intramedullary nail 09/2020 -PT OT recommending SNF placement    In agreement with assessment of the pressure ulcer as below:  Pressure Injury 11/07/20 Heel Right Deep Tissue Pressure Injury - Purple or maroon localized area of discolored intact skin or blood-filled blister due to damage of underlying soft tissue from pressure and/or shear. (Active)  11/07/20 2000  Location: Heel  Location Orientation: Right  Staging: Deep Tissue Pressure Injury - Purple or maroon localized area of discolored intact skin or blood-filled blister due  to damage of underlying soft tissue from pressure and/or shear.  Wound Description (Comments):   Present on Admission:      Pressure Injury 12/15/20 Heel Right Deep Tissue Pressure Injury - Purple or maroon localized area of discolored intact skin or blood-filled blister due to damage of underlying soft tissue from pressure and/or shear. (Active)  12/15/20 1800  Location: Heel  Location Orientation: Right  Staging: Deep Tissue Pressure Injury - Purple or maroon localized area of discolored intact skin or blood-filled blister due to damage of underlying soft tissue from pressure and/or shear.  Wound Description (Comments):   Present on Admission: Yes         DVT prophylaxis:  enoxaparin (LOVENOX) injection 40 mg Start: 12/15/20 1615  Code Status:     Code Status Orders  (From admission, onward)         Start     Ordered   12/15/20 1609  Full code  Continuous        12/15/20 1609        Code Status History    Date Active Date Inactive Code Status Order ID Comments User Context   11/12/2020 1820 12/08/2020 1821 Full Code [id number]  [place], [facility] Inpatient   11/05/2020 2348 11/12/2020 1756 Full Code [id number]  Vernelle Emerald, MD ED   10/18/2020 2223 10/28/2020 1955 Full Code [id number]  Luna Fuse,  Orlean Bradford, DO ED   06/19/2017 0232 06/20/2017 2149 Full Code [id number]  Rise Patience, MD ED   09/19/2016 1425 09/25/2016 2116 Full Code [id number]  Stark Klein, MD Inpatient   02/27/2016 2042 03/02/2016 1607 Full Code [id number]  Benito Mccreedy, MD Inpatient   02/27/2016 2042 02/27/2016 2042 Full Code [id number]  Benito Mccreedy, MD Inpatient   08/18/2011 2216 08/23/2011 2339 Full Code [id number]  Theone Stanley, RN Inpatient   Advance Care Planning Activity     Family Communication: None at bedside Disposition Plan:  Status is: Inpatient  Remains inpatient appropriate because:Unsafe d/c plan   Dispo: The patient is from: Home              Anticipated d/c is to:  SNF              Patient currently is medically stable to d/c.  SNF placement pending, remove Foley today   Difficult to place patient No          Antimicrobials:  Anti-infectives (From admission, onward)   Start     Dose/Rate Route Frequency Ordered Stop   12/20/20 2200  linezolid (ZYVOX) tablet 600 mg        600 mg Oral Every 12 hours 12/20/20 0947     12/17/20 2200  linezolid (ZYVOX) tablet 600 mg  Status:  Discontinued        600 mg Oral Every 12 hours 12/17/20 1543 12/20/20 0947   12/15/20 2200  cefTRIAXone (ROCEPHIN) 2 g in sodium chloride 0.9 % 100 mL IVPB  Status:  Discontinued        2 g 200 mL/hr over 30 Minutes Intravenous Every 12 hours 12/15/20 1650 12/17/20 1543   12/15/20 1645  cefTRIAXone (ROCEPHIN) IVPB  Status:  Discontinued        2 g Intravenous Every 12 hours 12/15/20 1631 12/15/20 1645   12/15/20 1645  metroNIDAZOLE (FLAGYL) tablet 500 mg  Status:  Discontinued       Note to Pharmacy: Treat through 12/21/20     500 mg Oral Every 8 hours 12/15/20 1631 12/17/20 1543       Objective: Vitals:   12/20/20 0440 12/20/20 0800 12/20/20 0900 12/20/20 1000  BP: (!) 176/80 (!) 154/92    Pulse: 61 86 81 62  Resp: 13 (!) 24 18 14   Temp: 97.9 F (36.6 C) 97.8 F (36.6 C)    TempSrc: Oral Oral    SpO2: 97% 94% 93% 95%  Weight:      Height:        Intake/Output Summary (Last 24 hours) at 12/20/2020 1229 Last data filed at 12/20/2020 0852 Gross per 24 hour  Intake 3 ml  Output 650 ml  Net -647 ml   Filed Weights   12/18/20 0500 12/19/20 0447 12/20/20 0202  Weight: 72.6 kg 72.1 kg 71.4 kg    Examination: General exam: Appears calm and comfortable, chronically ill appearing Respiratory system: Clear to auscultation. Respiratory effort normal. Cardiovascular system: S1 & S2 heard, RRR. No pedal edema. Gastrointestinal system: Abdomen is nondistended, soft and nontender. Normal bowel sounds heard. Central nervous system: Alert and oriented. Non focal exam.  Speech clear  Extremities: Symmetric in appearance bilaterally  Skin: No rashes, lesions or ulcers on exposed skin  Psychiatry: Judgement and insight appear stable. Mood & affect appropriate.    Data Reviewed: I have personally reviewed following labs and imaging studies  CBC: Recent Labs  Lab 12/13/20 2015 12/15/20 1155 12/15/20 1202 12/16/20 0348  12/17/20 0806 12/18/20 0310 12/19/20 0133 12/20/20 0450  WBC 13.5* 11.6*  --  14.7* 11.3* 10.1 8.0 6.9  NEUTROABS 11.1* 10.1*  --   --   --   --   --   --   HGB 9.7* 10.4*   < > 9.8* 10.0* 9.8* 10.4* 10.8*  HCT 30.4* 32.7*   < > 30.4* 31.6* 30.5* 31.8* 32.8*  MCV 89.9 89.8  --  89.1 90.5 90.2 88.8 88.6  PLT 361 374  --  342 351 382 393 394   < > = values in this interval not displayed.   Basic Metabolic Panel: Recent Labs  Lab 12/15/20 1610 12/16/20 0348 12/17/20 0806 12/18/20 0310 12/19/20 0133 12/20/20 0450  NA  --  135 136 135 135 135  K  --  3.5 2.9* 3.9 3.7 3.6  CL  --  100 102 100 100 101  CO2  --  27 30 29 28 30   GLUCOSE  --  286* 70 136* 190* 232*  BUN  --  7* 5* 6* 7* 7*  CREATININE  --  0.59* 0.37* 0.54* 0.54* 0.61  CALCIUM  --  8.4* 8.3* 8.6* 8.4* 8.5*  MG 2.3  --   --   --   --   --    GFR: Estimated Creatinine Clearance: 90.5 mL/min (by C-G formula based on SCr of 0.61 mg/dL). Liver Function Tests: Recent Labs  Lab 12/13/20 2015 12/15/20 1155  AST 46* 21  ALT 34 26  ALKPHOS 1,538* 1,159*  BILITOT 0.4 0.7  PROT 6.8 6.9  ALBUMIN 2.8* 2.7*   No results for input(s): LIPASE, AMYLASE in the last 168 hours. No results for input(s): AMMONIA in the last 168 hours. Coagulation Profile: Recent Labs  Lab 12/15/20 1155  INR 1.2   Cardiac Enzymes: No results for input(s): CKTOTAL, CKMB, CKMBINDEX, TROPONINI in the last 168 hours. BNP (last 3 results) No results for input(s): PROBNP in the last 8760 hours. HbA1C: No results for input(s): HGBA1C in the last 72 hours. CBG: Recent Labs  Lab  12/19/20 1137 12/19/20 1656 12/19/20 2120 12/20/20 0607 12/20/20 1207  GLUCAP 236* 249* 235* 265* 230*   Lipid Profile: No results for input(s): CHOL, HDL, LDLCALC, TRIG, CHOLHDL, LDLDIRECT in the last 72 hours. Thyroid Function Tests: No results for input(s): TSH, T4TOTAL, FREET4, T3FREE, THYROIDAB in the last 72 hours. Anemia Panel: No results for input(s): VITAMINB12, FOLATE, FERRITIN, TIBC, IRON, RETICCTPCT in the last 72 hours. Sepsis Labs: Recent Labs  Lab 12/15/20 1158 12/15/20 1814  LATICACIDVEN 0.9 1.8    Recent Results (from the past 240 hour(s))  Resp Panel by RT-PCR (Flu A&B, Covid) Nasopharyngeal Swab     Status: None   Collection Time: 12/15/20 11:52 AM   Specimen: Nasopharyngeal Swab; Nasopharyngeal(NP) swabs in vial transport medium  Result Value Ref Range Status   SARS Coronavirus 2 by RT PCR NEGATIVE NEGATIVE Final    Comment: (NOTE) SARS-CoV-2 target nucleic acids are NOT DETECTED.  The SARS-CoV-2 RNA is generally detectable in upper respiratory specimens during the acute phase of infection. The lowest concentration of SARS-CoV-2 viral copies this assay can detect is 138 copies/mL. A negative result does not preclude SARS-Cov-2 infection and should not be used as the sole basis for treatment or other patient management decisions. A negative result may occur with  improper specimen collection/handling, submission of specimen other than nasopharyngeal swab, presence of viral mutation(s) within the areas targeted by this assay, and inadequate number of  viral copies(<138 copies/mL). A negative result must be combined with clinical observations, patient history, and epidemiological information. The expected result is Negative.  Fact Sheet for Patients:  EntrepreneurPulse.com.au  Fact Sheet for Healthcare Providers:  IncredibleEmployment.be  This test is no t yet approved or cleared by the [place] FDA and  has  been authorized for detection and/or diagnosis of SARS-CoV-2 by FDA under an Emergency Use Authorization (EUA). This EUA will remain  in effect (meaning this test can be used) for the duration of the COVID-19 declaration under Section 564(b)(1) of the Act, 21 U.S.C.section 360bbb-3(b)(1), unless the authorization is terminated  or revoked sooner.       Influenza A by PCR NEGATIVE NEGATIVE Final   Influenza B by PCR NEGATIVE NEGATIVE Final    Comment: (NOTE) The Xpert Xpress SARS-CoV-2/FLU/RSV plus assay is intended as an aid in the diagnosis of influenza from Nasopharyngeal swab specimens and should not be used as a sole basis for treatment. Nasal washings and aspirates are unacceptable for Xpert Xpress SARS-CoV-2/FLU/RSV testing.  Fact Sheet for Patients: EntrepreneurPulse.com.au  Fact Sheet for Healthcare Providers: IncredibleEmployment.be  This test is not yet approved or cleared by the [place] FDA and has been authorized for detection and/or diagnosis of SARS-CoV-2 by FDA under an Emergency Use Authorization (EUA). This EUA will remain in effect (meaning this test can be used) for the duration of the COVID-19 declaration under Section 564(b)(1) of the Act, 21 U.S.C. section 360bbb-3(b)(1), unless the authorization is terminated or revoked.  Performed at [facility], [place] [address] [place], [place] [address]   Culture, blood (routine x 2)     Status: None   Collection Time: 12/15/20 12:00 PM   Specimen: BLOOD LEFT HAND  Result Value Ref Range Status   Specimen Description BLOOD LEFT HAND  Final   Special Requests   Final    BOTTLES DRAWN AEROBIC AND ANAEROBIC Blood Culture results may not be optimal due to an inadequate volume of blood received in culture bottles   Culture   Final    NO GROWTH 5 DAYS Performed at [facility], [place] [address], [place], [place] [address]    Report Status 12/20/2020 FINAL  Final   Culture, blood (routine x 2)     Status: None   Collection Time: 12/15/20 12:06 PM   Specimen: BLOOD RIGHT HAND  Result Value Ref Range Status   Specimen Description BLOOD RIGHT HAND  Final   Special Requests   Final    BOTTLES DRAWN AEROBIC AND ANAEROBIC Blood Culture results may not be optimal due to an inadequate volume of blood received in culture bottles   Culture   Final    NO GROWTH 5 DAYS Performed at [facility], [place] [address], [place], [place] [address]    Report Status 12/20/2020 FINAL  Final  Culture, Urine     Status: Abnormal   Collection Time: 12/16/20  2:24 PM   Specimen: Urine, Random  Result Value Ref Range Status   Specimen Description URINE, RANDOM  Final   Special Requests   Final    NONE Performed at [facility], [place] [address], [place], [place] [address]    Culture MULTIPLE SPECIES PRESENT, SUGGEST RECOLLECTION (A)  Final   Report Status 12/17/2020 FINAL  Final      Radiology Studies: No results found.    Scheduled Meds: . Chlorhexidine Gluconate Cloth  6 each Topical Daily  . citalopram  40 mg Oral Daily  . diclofenac Sodium  2 g Topical QID  . enoxaparin (LOVENOX) injection  40 mg Subcutaneous Q24H  . hydrocerin  1 application Topical BID  . hydrocortisone cream   Topical BID  . insulin aspart  0-9 Units Subcutaneous TID WC  . insulin aspart  3 Units Subcutaneous TID WC  . insulin detemir  10 Units Subcutaneous Daily  . levETIRAcetam  1,000 mg Oral BID  . lidocaine  1 patch Transdermal Q24H  . linezolid  600 mg Oral Q12H  . lipase/protease/amylase  24,000 Units Oral TID AC  . midodrine  5 mg Oral TID WC  . pantoprazole  40 mg Oral Daily  . sodium chloride flush  3 mL Intravenous Q12H  . tamsulosin  0.8 mg Oral Daily   Continuous Infusions:    LOS: 4 days      Time spent: 20 minutes   Dessa Phi, DO [facility] 12/20/2020, 12:29 PM   Available via Epic secure chat 7am-7pm After these hours, please refer  to coverage provider listed on [email or web address]

## 2020-12-20 NOTE — Progress Notes (Signed)
   12/20/20 1000  Clinical Encounter Type  Visited With Patient  Visit Type Spiritual support;Social support  Referral From Nurse  Consult/Referral To [place]  The chaplain responded to a [place] for spiritual support. The patient has been depressed based on recent health concerns. The patient disclosed some of his family dynamics and the chaplain provided active listening. The patient talked about his son and desires to talk with him. The patient requested prayer. The chaplain prayed for peace, clarity and direction for the patient. The patient desires to have a solid relationship with his family. The patient is uncertain about his health prognosis. The patient will need ongoing support. The chaplain will follow up as needed.

## 2020-12-21 LAB — GLUCOSE, CAPILLARY
Glucose-Capillary: 192 mg/dL — ABNORMAL HIGH (ref 70–99)
Glucose-Capillary: 328 mg/dL — ABNORMAL HIGH (ref 70–99)
Glucose-Capillary: 288 mg/dL — ABNORMAL HIGH (ref 70–99)

## 2020-12-21 LAB — FUNGUS CULTURE RESULT

## 2020-12-21 LAB — FUNGUS CULTURE WITH STAIN

## 2020-12-21 LAB — FUNGAL ORGANISM REFLEX

## 2020-12-21 MED ORDER — INSULIN DETEMIR 100 UNIT/ML ~~LOC~~ SOLN
14.0000 [IU] | Freq: Every day | SUBCUTANEOUS | Status: DC
  Administered 2020-12-21 – 2020-12-22 (×2): 14 [IU] via SUBCUTANEOUS
  Filled 2020-12-21 (×3): qty 0.14

## 2020-12-21 MED ORDER — HYDROCODONE-ACETAMINOPHEN 5-325 MG PO TABS: 1.0000 | ORAL_TABLET | ORAL | 0 refills | Status: DC | PRN

## 2020-12-21 MED ORDER — LINEZOLID 600 MG PO TABS: 600.0000 mg | ORAL_TABLET | Freq: Two times a day (BID) | ORAL | 0 refills | Status: DC

## 2020-12-21 MED ORDER — INSULIN DETEMIR 100 UNIT/ML FLEXPEN: 14.0000 [IU] | PEN_INJECTOR | Freq: Every day | SUBCUTANEOUS | 0 refills | Status: DC

## 2020-12-21 MED ORDER — CITALOPRAM HYDROBROMIDE 40 MG PO TABS: 40.0000 mg | ORAL_TABLET | Freq: Every day | ORAL | 2 refills | Status: DC

## 2020-12-21 MED ORDER — INSULIN ASPART 100 UNIT/ML IJ SOLN
5.0000 [IU] | Freq: Three times a day (TID) | INTRAMUSCULAR | Status: DC
  Administered 2020-12-21 – 2020-12-22 (×2): 5 [IU] via SUBCUTANEOUS

## 2020-12-21 NOTE — Progress Notes (Signed)
Inpatient Diabetes Program Recommendations  AACE/ADA: New Consensus Statement on Inpatient Glycemic Control (2015)  Target Ranges:  Prepandial:   less than 140 mg/dL      Peak postprandial:   less than 180 mg/dL (1-2 hours)      Critically ill patients:  140 - 180 mg/dL   Lab Results  Component Value Date   GLUCAP 328 (H) 12/21/2020   HGBA1C 10.8 (H) 10/18/2020    Review of Glycemic Control Results for [name], Colton "[name]" (MRN [id number]) as of 12/21/2020 13:10  Ref. Range 12/21/2020 06:08 12/21/2020 11:21  Glucose-Capillary Latest Ref Range: 70 - 99 mg/dL 192 (H) 328 (H)     Inpatient Diabetes Program Recommendations:     Novolog 5 units TID with meals if eats at least 50%.  Will continue to follow while inpatient.  Thank you, Reche Dixon, RN, BSN Diabetes Coordinator Inpatient Diabetes Program [phone] (team pager from 8a-5p)

## 2020-12-21 NOTE — Discharge Summary (Signed)
Physician Discharge Summary  Colton Bush:[id number] DOB: 11-03-1955 DOA: 12/15/2020  PCP: [name], MD  Admit date: 12/15/2020 Discharge date: 12/21/2020  Admitted From: [facility] --> Home  Disposition:  SNF   Recommendations for Outpatient Follow-up:  1. Follow up with PCP in 1 week  Discharge Condition: Stable CODE STATUS: Full  Diet recommendation: Regular diet   Brief/Interim Summary: Colton Bush a 65 y.o.malewith medical history significant ofhypertension, hyperlipidemia, recurrent pancreatic cancer who presents after his wife found him unresponsive this morning. Most of the history is obtained from the patient's wife who is present at bedside. She states that normally he is up before she is, but this morning he was still in bed. After little while she went and checked on him and noticed that he was drooling of the side of his mouth and had sonorous respirations. Denied seeing any seizure-like activity. Attempted multiple times to wake him and thereafter called 911. They found his blood sugar to be around 41. Patient had last taken Levemir approximately 20 units the night before, but did not eat dinner. Since he had been home from rehab he reportedly had been eating regularly. However, patient reports that he recently he had just started feeling bad over the last 2 to 3 days. Wife also reports that over the last 2 to 3 days his blood sugars that intermittently been dropping into the 60-70sat home. After he was discharged from rehab he reported that they sent him home on MS Contin as well as hydrocodone, but he did not like the way the made him feel and has not really taken it.  Patient just recently been hospitalized for right hip fracture status post intramedullary nailin3/2022. Hishospital course was complicated E. coli UTI along with strep anginosis bacteremiain 1 of 4 blood cultures treated with Rocephin and then amoxicillin for total of 7 days. Patient was  sent to a rehab facility,but subsequently had to be hospitalized from 4/8-4/15 with severe sepsis found to have liver abscesses status post JP drain x2and rightempyema. During his hospitalization patient was noted to be acutely confused for which imaging studies of the brain revealed subdural hematomas requiring burr holes. He was seen by ID and recommended to continue on Rocephin and oral metronidazole to complete a total of 6 weeks (5/24). Patient was noted to have concern for probable seizures for which he was started on Keppra.Patient was hewent to inpatient rehab from4/15-5/11. After that he was able to be discharged from rehab he presented back to the emergency department on 5/12 and again on 5/16 for weakness.  He was now hospitalized on 5/18 for hypoglycemia.  Patient's insulin regimen was adjusted.  Diabetic coordinator consulted.  Due to leukocytosis and persistent empyema, infectious disease was consulted.  After reviewing imaging, IR felt that empyema was too small for percutaneous drainage.  Antibiotic was changed from Rocephin/Flagyl to linezolid.  Patient was discharged to SNF.  Discharge Diagnoses:  Principal Problem:   Empyema lung ([place]) Active Problems:   Hyperlipidemia, mixed   Essential hypertension   GERD without esophagitis   Hypoglycemia   Liver abscess   Bladder outlet obstruction   Hypokalemia   Hypoglycemia with history of diabetes mellitus type 2, poorly controlled -Hypoglycemia resolved -Continue Levemir, sliding scale insulin - levemir dose decreased  -Appreciate diabetic coordinator  Leukocytosis -Blood cultures negative to date -UA negative, urine culture with multiple species -CT A/P: Subtle/questionable edema within the medial cortex of the RIGHT kidney, and new fluid stranding overlying the  upper pole of the RIGHT kidney. This may represent mild/early pyelonephritis. Complex/fluid collection at the RIGHT lung base is partially imaged, more  completely imaged on earlier abdomen CT of 11/25/2020, compatible with previously described empyema.  -Resolved  Empyema -CT A/P: Complex/fluid collection at the RIGHT lung base is partially imaged, more completely imaged on earlier abdomen CT of 11/25/2020, compatible with previously described empyema. Consider complete characterization with chest CT. -Chest CT with persistent small right-sided empyema -Appreciate infectious disease -IR consulted, after reviewing images, they feel the empyema is too small for percutaneous drainage at this time -Continue linezolid for 4 weeks starting 5/19-6/15.  Repeat CT chest around 3 weeks to ensure empyema is resolved.  Monitor CBC weekly.  Follow-up with infectious disease as outpatient  Liver abscesses -Has been status post JP drain x2 -CT A/P: No recurrent abscess -Rocephin and Flagyl discontinued, PICC removed   Recurrent pancreatic cancer -History of Whipple procedure status post chemo and radiation -Continue Creon -Follows with Dr. [name] appreciate her stopping by  Orthostatic hypotension  -Continue midodrine  Seizure disorder -Continue Keppra  History of subdural hematoma status post bur hole 4/12 -Repeat CT head 5/16: No significant change in size or appearance of thin left convexity subdural hematoma without significant mass effect or midline shift. No acute interval change since 12/10/2020.  BPH  -Continue Flomax -Foley catheter removed 5/23.  He has had 3 urinary voiding.  He required one-time straight cath last night.  Recent right hip fracture status postrepair by intramedullary nail 09/2020 -PT OT recommending SNF placement  Depression/anxiety -Celexa dosing increased    In agreement with assessment of the pressure ulcer as below:  Pressure Injury 11/07/20 Heel Right Deep Tissue Pressure Injury - Purple or maroon localized area of discolored intact skin or blood-filled blister due to damage of underlying soft  tissue from pressure and/or shear. (Active)  11/07/20 2000  Location: Heel  Location Orientation: Right  Staging: Deep Tissue Pressure Injury - Purple or maroon localized area of discolored intact skin or blood-filled blister due to damage of underlying soft tissue from pressure and/or shear.  Wound Description (Comments):   Present on Admission:      Pressure Injury 12/15/20 Heel Right Deep Tissue Pressure Injury - Purple or maroon localized area of discolored intact skin or blood-filled blister due to damage of underlying soft tissue from pressure and/or shear. (Active)  12/15/20 1800  Location: Heel  Location Orientation: Right  Staging: Deep Tissue Pressure Injury - Purple or maroon localized area of discolored intact skin or blood-filled blister due to damage of underlying soft tissue from pressure and/or shear.  Wound Description (Comments):   Present on Admission: Yes       Discharge Instructions  Discharge Instructions    Diet general   Complete by: As directed    Increase activity slowly   Complete by: As directed    No wound care   Complete by: As directed      Allergies as of 12/21/2020   No Known Allergies     Medication List    STOP taking these medications   cefTRIAXone  IVPB Commonly known as: ROCEPHIN   metroNIDAZOLE 500 MG tablet Commonly known as: FLAGYL   morphine 30 MG 12 hr tablet Commonly known as: MS CONTIN   potassium chloride 20 MEQ packet Commonly known as: KLOR-CON     TAKE these medications   acetaminophen 325 MG tablet Commonly known as: TYLENOL Take 1-2 tablets (325-650 mg total) by mouth every  4 (four) hours as needed for mild pain.   ascorbic acid 500 MG tablet Commonly known as: VITAMIN C Take 1 tablet (500 mg total) by mouth daily.   citalopram 40 MG tablet Commonly known as: CELEXA Take 1 tablet (40 mg total) by mouth daily. What changed:   medication strength  how much to take   diclofenac Sodium 1 % Gel Commonly  known as: VOLTAREN Apply 2 g topically 4 (four) times daily. To right knee   hydrocerin Crea Apply 1 application topically 2 (two) times daily. To legs and back   HYDROcodone-acetaminophen 5-325 MG tablet Commonly known as: NORCO/VICODIN Take 1 tablet by mouth every 3 (three) hours as needed for severe pain.   hydrocortisone cream 1 % Apply topically 2 (two) times daily. To back/areas of itching   insulin detemir 100 UNIT/ML FlexPen Commonly known as: LEVEMIR Inject 14 Units into the skin daily. What changed:   how much to take  when to take this   levETIRAcetam 1000 MG tablet Commonly known as: KEPPRA Take 1 tablet (1,000 mg total) by mouth 2 (two) times daily.   lidocaine 5 % Commonly known as: LIDODERM Place 1 patch onto the skin daily. Apply to lower back at 8 pm and remove at 8 am daily   linezolid 600 MG tablet Commonly known as: ZYVOX Take 1 tablet (600 mg total) by mouth every 12 (twelve) hours for 22 days.   loperamide 2 MG capsule Commonly known as: IMODIUM Take 2 mg by mouth daily as needed for diarrhea or loose stools.   loratadine 10 MG tablet Commonly known as: CLARITIN Take 1 tablet (10 mg total) by mouth daily.   midodrine 5 MG tablet Commonly known as: PROAMATINE Take 1 tablet (5 mg total) by mouth 3 (three) times daily with meals.   multivitamin with minerals Tabs tablet Take 1 tablet by mouth daily.   NovoLOG FlexPen 100 UNIT/ML FlexPen Generic drug: insulin aspart Inject 2-13 Units into the skin 3 (three) times daily with meals. Sliding Scale insulin for blood sugars from 101-150 - 2 units. For blood sugars from 151-200 - 3 units. For blood sugars from 201-250 - 5 units. For blood sugars from 301-350 use 7 units. Call MD if blood sugars are over 300.   omeprazole 20 MG capsule Commonly known as: PRILOSEC TAKE 1 CAPSULE DAILY   ondansetron 4 MG disintegrating tablet Commonly known as: ZOFRAN-ODT Take 1 tablet (4 mg total) by mouth every 6  (six) hours as needed for nausea or vomiting (can alternate with compazine).   Pancrelipase (Lip-Prot-Amyl) 24000-76000 units Cpep Take 1 capsule (24,000 Units total) by mouth 3 (three) times daily before meals.   Pen Needles 32G X 6 MM Misc 1 application by Does not apply route as needed.   polycarbophil 625 MG tablet Commonly known as: FIBERCON Take 1 tablet (625 mg total) by mouth 2 (two) times daily.   polyethylene glycol 17 g packet Commonly known as: MIRALAX / GLYCOLAX Take 17 g by mouth daily as needed for mild constipation.   simethicone 80 MG chewable tablet Commonly known as: MYLICON Chew 1 tablet (80 mg total) by mouth 4 (four) times daily -  with meals and at bedtime.   tamsulosin 0.4 MG Caps capsule Commonly known as: FLOMAX Take 2 capsules (0.8 mg total) by mouth daily.   zinc sulfate 220 (50 Zn) MG capsule Take 1 capsule (220 mg total) by mouth daily.       Follow-up Information  [name], MD. Schedule an appointment as soon as possible for a visit in 1 week(s).   Specialty: Family Medicine Contact information: [place] [address] [phone]        [facility]             . Schedule an appointment as soon as possible for a visit in 3 week(s).   Contact information: [address] [place] [address]             No Known Allergies  Consultations:  ID    Procedures/Studies: DG Chest 2 View  Result Date: 12/13/2020 CLINICAL DATA:  Weakness EXAM: CHEST - 2 VIEW COMPARISON:  October 09, 2020 FINDINGS: Right upper extremity PICC with tip overlying the superior cavoatrial junction. The heart size and mediastinal contours are within normal limits. Increased conspicuity of the they density overlying the right midlung. Left lung is clear. The visualized skeletal structures are unremarkable. IMPRESSION: Increased conspicuity of they density overlying the right  midlung, corresponding with the posterior loculated fluid collection. Electronically Signed   By: Dahlia Bailiff MD   On: 12/13/2020 21:07   DG Chest 2 View  Result Date: 12/09/2020 CLINICAL DATA:  Weakness EXAM: CHEST - 2 VIEW COMPARISON:  11/19/2020 FINDINGS: Cardiac shadow is within normal limits. Patchy airspace opacity is again seen in the right mid lung stable in appearance from the prior exam consistent with some loculated pleural fluid along the posterior margin. Right-sided PICC line is noted in satisfactory position at the cavoatrial junction. The left lung is clear. No bony abnormality is noted. IMPRESSION: Right-sided PICC line in satisfactory position. Vague density overlying the right mid lung which corresponds to posterior loculated fluid. Electronically Signed   By: Inez Catalina M.D.   On: 12/09/2020 17:37   DG Abd 1 View  Result Date: 12/02/2020 CLINICAL DATA:  Nausea and vomiting EXAM: ABDOMEN - 1 VIEW COMPARISON:  11/25/2020 FINDINGS: Scattered large and small bowel gas is noted. No obstructive changes are seen. No free air is noted. Postsurgical changes are noted in the right hip and mid abdomen. Degenerative changes of lumbar spine are seen. Bone island is noted within the left iliac bone. IMPRESSION: Postsurgical changes without acute abnormality. Electronically Signed   By: Inez Catalina M.D.   On: 12/02/2020 15:55   CT Head Wo Contrast  Result Date: 12/13/2020 CLINICAL DATA:  Head trauma fall EXAM: CT HEAD WITHOUT CONTRAST TECHNIQUE: Contiguous axial images were obtained from the base of the skull through the vertex without intravenous contrast. COMPARISON:  CT brain 12/10/2020 FINDINGS: Brain: No acute territorial infarction, mass or new hemorrhage is visualized. Left hemispheric subdural hematoma without significant change since 12/10/2020, measures 3 mm maximum. No midline shift. Mild atrophy and chronic small vessel ischemic changes of the white matter. Vascular: No hyperdense  vessels.  No unexpected calcification Skull: Left frontal burr hole.  No fracture Sinuses/Orbits: No acute finding. Other: None IMPRESSION: 1. No significant change in size or appearance of thin left convexity subdural hematoma without significant mass effect or midline shift. No acute interval change since 12/10/2020. 2. Atrophy and chronic small vessel ischemic change of the white matter Electronically Signed   By: Donavan Foil M.D.   On: 12/13/2020 22:05   CT Head Wo Contrast  Result Date: 12/10/2020 CLINICAL DATA:  Altered mental status EXAM: CT HEAD WITHOUT CONTRAST TECHNIQUE: Contiguous axial images were obtained from the base of the skull  through the vertex without intravenous contrast. COMPARISON:  11/07/2020 FINDINGS: Brain: Previously seen left subdural hematoma is decreased since prior study, now measuring 3 mm in thickness compared with 10 mm previously. No mass effect or midline shift. No new hemorrhage. No acute infarct. No hydrocephalus. Vascular: No hyperdense vessel or unexpected calcification. Skull: No acute calvarial abnormality. Burr hole in the left frontal region. Sinuses/Orbits: No acute findings Other: None IMPRESSION: Small residual left subdural hematoma, decreased significantly in size since prior study, now 3 mm in thickness maximally. No mass effect or midline shift. Electronically Signed   By: Rolm Baptise M.D.   On: 12/10/2020 09:36   CT CHEST WO CONTRAST  Result Date: 12/16/2020 CLINICAL DATA:  Evaluate empyema EXAM: CT CHEST WITHOUT CONTRAST TECHNIQUE: Multidetector CT imaging of the chest was performed following the standard protocol without IV contrast. COMPARISON:  CT of the abdomen and pelvis from 12/11/2018, chest x-ray from the previous day. FINDINGS: Cardiovascular: Somewhat limited due to lack of IV contrast. Aortic calcifications are noted. Coronary calcifications are seen. Right-sided PICC line is noted in satisfactory position. Mediastinum/Nodes: Thoracic inlet  is within normal limits. The esophagus as visualized is within normal limits. No sizable hilar or mediastinal adenopathy is noted. Lungs/Pleura: Lungs are well aerated bilaterally. The left lung shows only minimal scarring in the base. Right lung shows some lower lobe atelectasis which is increased slightly in the interval from the prior CT of 12/10/2020. Additionally, small right-sided pleural effusion is noted which is increased slightly in the interval from the prior exam. It has some thickened margins suggestive of empyema. The thickened margins suggest underlying empyema. A small focus of air is noted within which may be related to prior thoracentesis. No pneumothorax is seen. Upper Abdomen: Postsurgical changes are noted in the stomach. No other focal abnormality is noted in the upper abdomen. Previously seen fluid collection in the right liver posteriorly is not well appreciated on today's exam. Musculoskeletal: Degenerative changes of the thoracic spine are noted. No acute rib abnormality is seen. Postsurgical changes in the cervical spine are noted. No compression deformities are seen. IMPRESSION: Small fluid collection in the posterior aspect of the right pleural space with thickened margins most consistent with a small empyema. A small focus of air is noted within likely related to prior intervention. Slight increase in lower lobe atelectasis on the right when compared with the prior exam. No other acute abnormality is noted. Aortic Atherosclerosis (ICD10-I70.0). Electronically Signed   By: Inez Catalina M.D.   On: 12/16/2020 15:43   CT ABDOMEN PELVIS W CONTRAST  Result Date: 12/10/2020 CLINICAL DATA:  Generalized weakness. Infection suspected rule out abscess. EXAM: CT ABDOMEN AND PELVIS WITH CONTRAST TECHNIQUE: Multidetector CT imaging of the abdomen and pelvis was performed using the standard protocol following bolus administration of intravenous contrast. CONTRAST:  87mL OMNIPAQUE IOHEXOL 300 MG/ML   SOLN COMPARISON:  CT abdomen dated 11/25/2020. CT abdomen dated 11/06/2020. FINDINGS: Lower chest: Complex/fluid collection at the RIGHT lung base is partially imaged. LEFT lung bases clear. Hepatobiliary: Status post cholecystectomy. Stable intrahepatic and extrahepatic bile duct dilatation. RIGHT hepatic drainage catheters the abdomen removed. No recurrent abscess. Small residual fluid stranding underlying the RIGHT liver lobe. Pancreas: Atrophic.  No acute findings. Spleen: Normal in size without focal abnormality. Adrenals/Urinary Tract: Adrenal glands appear normal. Subtle/questionable edema within the medial cortex of the RIGHT kidney. New fluid stranding overlying the upper pole of the RIGHT kidney. No renal stone or hydronephrosis bilaterally. Foley catheter within  the bladder. Stomach/Bowel: No dilated large or small bowel loops. Surgical anastomosis at the stomach antrum, stable. No evidence of active bowel wall thickening/inflammation. Appendix is unremarkable. Vascular/Lymphatic: Aortic atherosclerosis. No acute-appearing vascular abnormality. No enlarged lymph nodes are seen. Reproductive: Prostate is unremarkable. Other: No abscess collection or free intraperitoneal air. Musculoskeletal: No acute-appearing osseous abnormality. Degenerative spondylosis of the thoracolumbar spine, mild to moderate in degree. Superficial soft tissues of the abdomen and and pelvis are unremarkable. IMPRESSION: 1. Subtle/questionable edema within the medial cortex of the RIGHT kidney, and new fluid stranding overlying the upper pole of the RIGHT kidney. This may represent mild/early pyelonephritis. Recommend correlation with urinalysis. 2. Complex/fluid collection at the RIGHT lung base is partially imaged, more completely imaged on earlier abdomen CT of 11/25/2020, compatible with previously described empyema. Consider complete characterization with chest CT. 3. No other acute or significant findings within the abdomen or  pelvis. No bowel obstruction or evidence of active bowel wall thickening/inflammation. No abscess collection or free intraperitoneal air. 4. Previously placed drainage catheters within the posterior RIGHT liver lobe have been removed. No recurrent abscess. Aortic Atherosclerosis (ICD10-I70.0). Electronically Signed   By: Franki Cabot M.D.   On: 12/10/2020 12:07   CT ABDOMEN PELVIS W CONTRAST  Result Date: 11/25/2020 CLINICAL DATA:  Intra-abdominal abscess, hepatic abscesses post percutaneous drainage. EXAM: CT ABDOMEN AND PELVIS WITH CONTRAST TECHNIQUE: Multidetector CT imaging of the abdomen and pelvis was performed using the standard protocol following bolus administration of intravenous contrast. CONTRAST:  143mL OMNIPAQUE IOHEXOL 300 MG/ML  SOLN COMPARISON:  November 06, 2020 FINDINGS: Lower chest: Interval development of pleural fluid with peripheral enhancement in the RIGHT lung base. Basilar atelectasis. No LEFT-sided effusion. Pleural fluid measuring 9.6 x 4.4 cm. Hepatobiliary: Atrophic LEFT hepatic lobe with biliary duct dilation. The soft tissue extending into the hepatic hilum along the course of hepatic vasculature. The SMV and main portal vein remain patent with distortion of the portal vein as on recent imaging including outside imaging from [facility] which was acquired on August 31, 2020. Occlusion of intrahepatic LEFT portal vein is similar to prior imaging. There is some biliary duct dilation also affecting the RIGHT hepatic lobe to a lesser extent. Hepatic abscesses and or biloma is that were drained have resolved. Drainage catheters remain in place in the RIGHT hemi liver. Pancreas: Post Whipple procedure. No pancreatic ductal dilation. Soft tissue about the celiac and extending in the porta hepatis as described. Spleen: Spleen is normal. Adrenals/Urinary Tract: Adrenal glands are normal. Symmetric renal enhancement.  No hydronephrosis. Stomach/Bowel: Post Whipple with alteration of  gastrointestinal anatomy as expected. No acute small bowel process. Colon is filled with liquid stool and is nondistended. Appendix is normal. Vascular/Lymphatic: No discrete adenopathy in the upper abdomen with soft tissue about the celiac axis. No retroperitoneal adenopathy. No mesenteric adenopathy. LEFT portal occlusion. Irregularity of the common hepatic artery and celiac axis due to surrounding ill-defined soft tissue. Reproductive: Unremarkable Other: No ascites.  No free air. Musculoskeletal: Spinal degenerative changes. Osteopenia. Post RIGHT femoral ORIF, recent postoperative changes with mild surrounding stranding, not unexpected and not significantly changed. IMPRESSION: 1. Findings of empyema in the RIGHT chest with associated airspace disease. This has developed since prior imaging of April 9th of 2022 but is decreased in size based on comparison with chest x-ray from November 18, 2020 following aspiration. 2. Hepatic abscesses and or biloma, post drainage now resolved. 3. Post Whipple procedure with soft tissue about the celiac axis and common  hepatic artery. Similar appearance of atrophy of the LEFT hepatic lobe. Findings are suspicious for disease recurrence with chronic occlusion of LEFT portal vein and extensive involvement of the celiac axis. 4. Post RIGHT femoral ORIF, recent postoperative changes with mild surrounding stranding, not unexpected and not significantly changed. These results will be called to the ordering clinician or representative by the Radiologist Assistant, and communication documented in the PACS or [employer]. Electronically Signed   By: Zetta Bills M.D.   On: 11/25/2020 17:26   DG Chest Port 1 View  Result Date: 12/15/2020 CLINICAL DATA:  sob EXAM: PORTABLE CHEST 1 VIEW COMPARISON:  Radiograph 12/13/2020, CT 12/10/2020 FINDINGS: Unchanged cardiomediastinal silhouette. Unchanged right upper extremity PICC. Unchanged opacity overlying the right mid lung  corresponding to the known posterior loculated pleural collection. There is no new airspace disease. Bones are unchanged. IMPRESSION: Unchanged right midlung opacity corresponding to known right pleural collection. No new airspace disease. Electronically Signed   By: Maurine Simmering   On: 12/15/2020 12:52   DG Knee Complete 4 Views Right  Result Date: 12/13/2020 CLINICAL DATA:  Fall with right knee swelling EXAM: RIGHT KNEE - COMPLETE 4+ VIEW COMPARISON:  October 19, 2020 FINDINGS: Partially visualized femoral intramedullary rod and screw fixation without evidence of hardware loosening or perihardware fracture within the visualized portions. No evidence of acute fracture or dislocation. Small joint effusion. Severe tricompartment degenerative change with chondrocalcinosis similar prior. Deformity of the proximal right fibula consistent with remote healed fracture. IMPRESSION: 1. No acute fracture or dislocation. 2. Small joint effusion. 3. Severe tricompartment degenerative change with chondrocalcinosis. Electronically Signed   By: Dahlia Bailiff MD   On: 12/13/2020 21:01   [place] EKG SITE RITE  Result Date: 12/06/2020 If Site Rite image not attached, placement could not be confirmed due to current cardiac rhythm.      Discharge Exam: Vitals:   12/21/20 0829 12/21/20 1111  BP: (!) 158/85 132/82  Pulse: 80 88  Resp: 16   Temp:  98.3 F (36.8 C)  SpO2: 97% 96%    General: Pt is alert, awake, not in acute distress Cardiovascular: RRR, S1/S2 +, no edema Respiratory: CTA bilaterally, no wheezing, no rhonchi, no respiratory distress, no conversational dyspnea  Abdominal: Soft, NT, ND, bowel sounds + Extremities: no edema, no cyanosis Psych: Normal mood and affect, stable judgement and insight     The results of significant diagnostics from this hospitalization (including imaging, microbiology, ancillary and laboratory) are listed below for reference.     Microbiology: Recent Results (from the  past 240 hour(s))  Resp Panel by RT-PCR (Flu A&B, Covid) Nasopharyngeal Swab     Status: None   Collection Time: 12/15/20 11:52 AM   Specimen: Nasopharyngeal Swab; Nasopharyngeal(NP) swabs in vial transport medium  Result Value Ref Range Status   SARS Coronavirus 2 by RT PCR NEGATIVE NEGATIVE Final    Comment: (NOTE) SARS-CoV-2 target nucleic acids are NOT DETECTED.  The SARS-CoV-2 RNA is generally detectable in upper respiratory specimens during the acute phase of infection. The lowest concentration of SARS-CoV-2 viral copies this assay can detect is 138 copies/mL. A negative result does not preclude SARS-Cov-2 infection and should not be used as the sole basis for treatment or other patient management decisions. A negative result may occur with  improper specimen collection/handling, submission of specimen other than nasopharyngeal swab, presence of viral mutation(s) within the areas targeted by this assay, and inadequate number of viral copies(<138 copies/mL). A negative result must be  combined with clinical observations, patient history, and epidemiological information. The expected result is Negative.  Fact Sheet for Patients:  EntrepreneurPulse.com.au  Fact Sheet for Healthcare Providers:  IncredibleEmployment.be  This test is no t yet approved or cleared by the [place] FDA and  has been authorized for detection and/or diagnosis of SARS-CoV-2 by FDA under an Emergency Use Authorization (EUA). This EUA will remain  in effect (meaning this test can be used) for the duration of the COVID-19 declaration under Section 564(b)(1) of the Act, 21 U.S.C.section 360bbb-3(b)(1), unless the authorization is terminated  or revoked sooner.       Influenza A by PCR NEGATIVE NEGATIVE Final   Influenza B by PCR NEGATIVE NEGATIVE Final    Comment: (NOTE) The Xpert Xpress SARS-CoV-2/FLU/RSV plus assay is intended as an aid in the diagnosis of  influenza from Nasopharyngeal swab specimens and should not be used as a sole basis for treatment. Nasal washings and aspirates are unacceptable for Xpert Xpress SARS-CoV-2/FLU/RSV testing.  Fact Sheet for Patients: EntrepreneurPulse.com.au  Fact Sheet for Healthcare Providers: IncredibleEmployment.be  This test is not yet approved or cleared by the [place] FDA and has been authorized for detection and/or diagnosis of SARS-CoV-2 by FDA under an Emergency Use Authorization (EUA). This EUA will remain in effect (meaning this test can be used) for the duration of the COVID-19 declaration under Section 564(b)(1) of the Act, 21 U.S.C. section 360bbb-3(b)(1), unless the authorization is terminated or revoked.  Performed at [facility], [place] [address], [place], [place] [address]   Culture, blood (routine x 2)     Status: None   Collection Time: 12/15/20 12:00 PM   Specimen: BLOOD LEFT HAND  Result Value Ref Range Status   Specimen Description BLOOD LEFT HAND  Final   Special Requests   Final    BOTTLES DRAWN AEROBIC AND ANAEROBIC Blood Culture results may not be optimal due to an inadequate volume of blood received in culture bottles   Culture   Final    NO GROWTH 5 DAYS Performed at [facility], [place] [address] [place], [place] [address]    Report Status 12/20/2020 FINAL  Final  Culture, blood (routine x 2)     Status: None   Collection Time: 12/15/20 12:06 PM   Specimen: BLOOD RIGHT HAND  Result Value Ref Range Status   Specimen Description BLOOD RIGHT HAND  Final   Special Requests   Final    BOTTLES DRAWN AEROBIC AND ANAEROBIC Blood Culture results may not be optimal due to an inadequate volume of blood received in culture bottles   Culture   Final    NO GROWTH 5 DAYS Performed at [facility], [place] [address], [place], [place] [address]    Report Status 12/20/2020 FINAL  Final  Culture, Urine     Status: Abnormal    Collection Time: 12/16/20  2:24 PM   Specimen: Urine, Random  Result Value Ref Range Status   Specimen Description URINE, RANDOM  Final   Special Requests   Final    NONE Performed at [facility], [place] [address] [place], [place] [address]    Culture MULTIPLE SPECIES PRESENT, SUGGEST RECOLLECTION (A)  Final   Report Status 12/17/2020 FINAL  Final  SARS CORONAVIRUS 2 (TAT 6-24 HRS) Nasopharyngeal Nasopharyngeal Swab     Status: None   Collection Time: 12/20/20 11:53 AM   Specimen: Nasopharyngeal Swab  Result Value Ref Range Status  SARS Coronavirus 2 NEGATIVE NEGATIVE Final    Comment: (NOTE) SARS-CoV-2 target nucleic acids are NOT DETECTED.  The SARS-CoV-2 RNA is generally detectable in upper and lower respiratory specimens during the acute phase of infection. Negative results do not preclude SARS-CoV-2 infection, do not rule out co-infections with other pathogens, and should not be used as the sole basis for treatment or other patient management decisions. Negative results must be combined with clinical observations, patient history, and epidemiological information. The expected result is Negative.  Fact Sheet for Patients: SugarRoll.be  Fact Sheet for Healthcare Providers: https://www.woods-mathews.com/  This test is not yet approved or cleared by the [place] FDA and  has been authorized for detection and/or diagnosis of SARS-CoV-2 by FDA under an Emergency Use Authorization (EUA). This EUA will remain  in effect (meaning this test can be used) for the duration of the COVID-19 declaration under Se ction 564(b)(1) of the Act, 21 U.S.C. section 360bbb-3(b)(1), unless the authorization is terminated or revoked sooner.  Performed at [facility], [place] [address], [place], [place] [address]      Labs: BNP (last 3 results) No results for input(s): BNP in the last 8760 hours. Basic Metabolic Panel: Recent Labs   Lab 12/15/20 1610 12/16/20 0348 12/17/20 0806 12/18/20 0310 12/19/20 0133 12/20/20 0450  NA  --  135 136 135 135 135  K  --  3.5 2.9* 3.9 3.7 3.6  CL  --  100 102 100 100 101  CO2  --  27 30 29 28 30   GLUCOSE  --  286* 70 136* 190* 232*  BUN  --  7* 5* 6* 7* 7*  CREATININE  --  0.59* 0.37* 0.54* 0.54* 0.61  CALCIUM  --  8.4* 8.3* 8.6* 8.4* 8.5*  MG 2.3  --   --   --   --   --    Liver Function Tests: Recent Labs  Lab 12/15/20 1155  AST 21  ALT 26  ALKPHOS 1,159*  BILITOT 0.7  PROT 6.9  ALBUMIN 2.7*   No results for input(s): LIPASE, AMYLASE in the last 168 hours. No results for input(s): AMMONIA in the last 168 hours. CBC: Recent Labs  Lab 12/15/20 1155 12/15/20 1202 12/16/20 0348 12/17/20 0806 12/18/20 0310 12/19/20 0133 12/20/20 0450  WBC 11.6*  --  14.7* 11.3* 10.1 8.0 6.9  NEUTROABS 10.1*  --   --   --   --   --   --   HGB 10.4*   < > 9.8* 10.0* 9.8* 10.4* 10.8*  HCT 32.7*   < > 30.4* 31.6* 30.5* 31.8* 32.8*  MCV 89.8  --  89.1 90.5 90.2 88.8 88.6  PLT 374  --  342 351 382 393 394   < > = values in this interval not displayed.   Cardiac Enzymes: No results for input(s): CKTOTAL, CKMB, CKMBINDEX, TROPONINI in the last 168 hours. BNP: Invalid input(s): POCBNP CBG: Recent Labs  Lab 12/20/20 1207 12/20/20 1658 12/20/20 2110 12/21/20 0608 12/21/20 1121  GLUCAP 230* 144* 112* 192* 328*   D-Dimer No results for input(s): DDIMER in the last 72 hours. Hgb A1c No results for input(s): HGBA1C in the last 72 hours. Lipid Profile No results for input(s): CHOL, HDL, LDLCALC, TRIG, CHOLHDL, LDLDIRECT in the last 72 hours. Thyroid function studies No results for input(s): TSH, T4TOTAL, T3FREE, THYROIDAB in the last 72 hours.  Invalid input(s): FREET3 Anemia work up No results for input(s): VITAMINB12, FOLATE, FERRITIN, TIBC, IRON,  RETICCTPCT in the last 72 hours. Urinalysis    Component Value Date/Time   COLORURINE YELLOW 12/16/2020 1546    APPEARANCEUR CLEAR 12/16/2020 1546   LABSPEC 1.032 (H) 12/16/2020 1546   PHURINE 6.0 12/16/2020 1546   GLUCOSEU >=500 (A) 12/16/2020 1546   HGBUR NEGATIVE 12/16/2020 1546   BILIRUBINUR NEGATIVE 12/16/2020 1546   BILIRUBINUR 1+ 02/23/2016 1256   [place] 12/16/2020 1546   PROTEINUR NEGATIVE 12/16/2020 1546   UROBILINOGEN 1.0 02/23/2016 1256   NITRITE NEGATIVE 12/16/2020 1546   LEUKOCYTESUR TRACE (A) 12/16/2020 1546   Sepsis Labs Invalid input(s): PROCALCITONIN,  WBC,  LACTICIDVEN Microbiology Recent Results (from the past 240 hour(s))  Resp Panel by RT-PCR (Flu A&B, Covid) Nasopharyngeal Swab     Status: None   Collection Time: 12/15/20 11:52 AM   Specimen: Nasopharyngeal Swab; Nasopharyngeal(NP) swabs in vial transport medium  Result Value Ref Range Status   SARS Coronavirus 2 by RT PCR NEGATIVE NEGATIVE Final    Comment: (NOTE) SARS-CoV-2 target nucleic acids are NOT DETECTED.  The SARS-CoV-2 RNA is generally detectable in upper respiratory specimens during the acute phase of infection. The lowest concentration of SARS-CoV-2 viral copies this assay can detect is 138 copies/mL. A negative result does not preclude SARS-Cov-2 infection and should not be used as the sole basis for treatment or other patient management decisions. A negative result may occur with  improper specimen collection/handling, submission of specimen other than nasopharyngeal swab, presence of viral mutation(s) within the areas targeted by this assay, and inadequate number of viral copies(<138 copies/mL). A negative result must be combined with clinical observations, patient history, and epidemiological information. The expected result is Negative.  Fact Sheet for Patients:  EntrepreneurPulse.com.au  Fact Sheet for Healthcare Providers:  IncredibleEmployment.be  This test is no t yet approved or cleared by the [place] FDA and  has been authorized for  detection and/or diagnosis of SARS-CoV-2 by FDA under an Emergency Use Authorization (EUA). This EUA will remain  in effect (meaning this test can be used) for the duration of the COVID-19 declaration under Section 564(b)(1) of the Act, 21 U.S.C.section 360bbb-3(b)(1), unless the authorization is terminated  or revoked sooner.       Influenza A by PCR NEGATIVE NEGATIVE Final   Influenza B by PCR NEGATIVE NEGATIVE Final    Comment: (NOTE) The Xpert Xpress SARS-CoV-2/FLU/RSV plus assay is intended as an aid in the diagnosis of influenza from Nasopharyngeal swab specimens and should not be used as a sole basis for treatment. Nasal washings and aspirates are unacceptable for Xpert Xpress SARS-CoV-2/FLU/RSV testing.  Fact Sheet for Patients: EntrepreneurPulse.com.au  Fact Sheet for Healthcare Providers: IncredibleEmployment.be  This test is not yet approved or cleared by the [place] FDA and has been authorized for detection and/or diagnosis of SARS-CoV-2 by FDA under an Emergency Use Authorization (EUA). This EUA will remain in effect (meaning this test can be used) for the duration of the COVID-19 declaration under Section 564(b)(1) of the Act, 21 U.S.C. section 360bbb-3(b)(1), unless the authorization is terminated or revoked.  Performed at [facility], [place] [address], [place], [place] [address]   Culture, blood (routine x 2)     Status: None   Collection Time: 12/15/20 12:00 PM   Specimen: BLOOD LEFT HAND  Result Value Ref Range Status   Specimen Description BLOOD LEFT HAND  Final   Special Requests   Final    BOTTLES DRAWN AEROBIC AND ANAEROBIC Blood Culture results may not be  optimal due to an inadequate volume of blood received in culture bottles   Culture   Final    NO GROWTH 5 DAYS Performed at [facility], [place] [address] [place], [place] [address]    Report Status 12/20/2020 FINAL  Final  Culture, blood  (routine x 2)     Status: None   Collection Time: 12/15/20 12:06 PM   Specimen: BLOOD RIGHT HAND  Result Value Ref Range Status   Specimen Description BLOOD RIGHT HAND  Final   Special Requests   Final    BOTTLES DRAWN AEROBIC AND ANAEROBIC Blood Culture results may not be optimal due to an inadequate volume of blood received in culture bottles   Culture   Final    NO GROWTH 5 DAYS Performed at [facility], [place] [address] [place], [place] [address]    Report Status 12/20/2020 FINAL  Final  Culture, Urine     Status: Abnormal   Collection Time: 12/16/20  2:24 PM   Specimen: Urine, Random  Result Value Ref Range Status   Specimen Description URINE, RANDOM  Final   Special Requests   Final    NONE Performed at [facility], [place] [address], [place], [place] [address]    Culture MULTIPLE SPECIES PRESENT, SUGGEST RECOLLECTION (A)  Final   Report Status 12/17/2020 FINAL  Final  SARS CORONAVIRUS 2 (TAT 6-24 HRS) Nasopharyngeal Nasopharyngeal Swab     Status: None   Collection Time: 12/20/20 11:53 AM   Specimen: Nasopharyngeal Swab  Result Value Ref Range Status   SARS Coronavirus 2 NEGATIVE NEGATIVE Final    Comment: (NOTE) SARS-CoV-2 target nucleic acids are NOT DETECTED.  The SARS-CoV-2 RNA is generally detectable in upper and lower respiratory specimens during the acute phase of infection. Negative results do not preclude SARS-CoV-2 infection, do not rule out co-infections with other pathogens, and should not be used as the sole basis for treatment or other patient management decisions. Negative results must be combined with clinical observations, patient history, and epidemiological information. The expected result is Negative.  Fact Sheet for Patients: SugarRoll.be  Fact Sheet for Healthcare Providers: https://www.woods-mathews.com/  This test is not yet approved or cleared by the [place] FDA and  has been  authorized for detection and/or diagnosis of SARS-CoV-2 by FDA under an Emergency Use Authorization (EUA). This EUA will remain  in effect (meaning this test can be used) for the duration of the COVID-19 declaration under Se ction 564(b)(1) of the Act, 21 U.S.C. section 360bbb-3(b)(1), unless the authorization is terminated or revoked sooner.  Performed at [facility], [place] [address], [place], [place] [address]      Patient was seen and examined on the day of discharge and was found to be in stable condition. Time coordinating discharge: 35 minutes including assessment and coordination of care, as well as examination of the patient.   SIGNED:  Dessa Phi, DO [facility] 12/21/2020, 12:11 PM

## 2020-12-21 NOTE — Progress Notes (Signed)
Physical Therapy Treatment Patient Details Name: Colton Bush MRN: [id number] DOB: [birth year] Today's Date: 12/21/2020    History of Present Illness Pt is a 65 y.o. male admitted 12/15/20 found unresponsive by wife, pt hypoglycemic. Head CT negative for acute changes. CXR with known R pleural effusion. Workup for hypoglycemia, hypokalemia. Of note, pt with recent admission 11/05/20 with sepsis s/p burr hole evacuation of L SDH, with CIR admission 11/12/20-12/08/20, recent ED visits 5/12 and 5/16 for weakness. Other PMH includes HTN, hernia, scoliosis, ACDF (2013), R femur fx (09/2020).    PT Comments    Patient eager to ambulate this afternoon per RN. Upon arrival to room, pt sleeping in chair. Attempted standing and mobility however pt very drowsy and limited due to dizziness and soft BP. Sitting BP 88/58, sitting BP after standing for a 15 seconds 86/64, supine BP 129/78. Attempted UE/LE exercises to improve BP however it did not so assisted pt back to bed. RN made aware. Difficult to progress ambulation due to low BP and dizziness. Recommend TED hose vs ace wraps to assist with drop in BP. Will follow.   Follow Up Recommendations  SNF;Supervision for mobility/OOB     Equipment Recommendations  None recommended by PT    Recommendations for Other Services       Precautions / Restrictions Precautions Precautions: Fall;Other (comment) Precaution Comments: watch BP Restrictions Weight Bearing Restrictions: No    Mobility  Bed Mobility Overal bed mobility: Needs Assistance Bed Mobility: Sit to Supine     Supine to sit: Supervision;HOB elevated Sit to supine: Supervision;HOB elevated   General bed mobility comments: No assist needed.    Transfers Overall transfer level: Needs assistance Equipment used: Rolling walker (2 wheeled) Transfers: Sit to/from Stand Sit to Stand: Min assist;Min guard Stand pivot transfers: Min guard       General transfer comment: Assist to power to  standing with increased time to get fully upright. + dizziness with eyes closing. Performed x2 from chair.  Ambulation/Gait Ambulation/Gait assistance: Min assist Gait Distance (Feet): 4 Feet Assistive device: Rolling walker (2 wheeled) Gait Pattern/deviations: Decreased stride length;Step-through pattern;Decreased stance time - right Gait velocity: decreased Gait velocity interpretation: <1.31 ft/sec, indicative of household ambulator General Gait Details: Able to take a few steps to get to bed, limited due to dizziness and low BP.   Stairs             Wheelchair Mobility    Modified Rankin (Stroke Patients Only)       Balance Overall balance assessment: Needs assistance Sitting-balance support: Feet supported;No upper extremity supported Sitting balance-[name]: Fair Sitting balance - Comments: supervision for safety.   Standing balance support: During functional activity Standing balance-[name]: Poor Standing balance comment: use of BUE support for dynamic tasks, dizzy so limited standing tolerance.                            Cognition Arousal/Alertness: Lethargic Behavior During Therapy: Flat affect Overall Cognitive Status: No family/caregiver present to determine baseline cognitive functioning Area of Impairment: Memory;Following commands;Safety/judgement;Problem solving                     Memory: Decreased short-term memory Following Commands: Follows one step commands consistently;Follows one step commands with increased time;Follows multi-step commands with increased time Safety/Judgement: Decreased awareness of deficits;Decreased awareness of safety Awareness: Emergent Problem Solving: Decreased initiation;Slow processing;Difficulty sequencing;Requires verbal cues General Comments: Pt with flat affect  and very sleepy.      Exercises      General Comments General comments (skin integrity, edema, etc.): Sitting BP 88/58,  sitting BP after standing for a 15 seconds 86/64, supine BP 129/78.      Pertinent Vitals/Pain Pain Assessment: Faces Faces Pain Scale: Hurts little more Pain Location: right ankle with WB Pain Descriptors / Indicators: Discomfort Pain Intervention(s): Monitored during session    Home Living                      Prior Function            PT Goals (current goals can now be found in the care plan section) Acute Rehab PT Goals Patient Stated Goal: get better; interested in post-acute rehab Progress towards PT goals: Not progressing toward goals - comment (due to dizziness and low BP)    Frequency    Min 2X/week      PT Plan Current plan remains appropriate    Co-evaluation              AM-PAC PT "6 Clicks" Mobility   Outcome Measure  Help needed turning from your back to your side while in a flat bed without using bedrails?: None Help needed moving from lying on your back to sitting on the side of a flat bed without using bedrails?: None Help needed moving to and from a bed to a chair (including a wheelchair)?: A Little Help needed standing up from a chair using your arms (e.g., wheelchair or bedside chair)?: A Little Help needed to walk in hospital room?: A Little Help needed climbing 3-5 steps with a railing? : A Lot 6 Click Score: 19    End of Session Equipment Utilized During Treatment: Gait belt Activity Tolerance: Treatment limited secondary to medical complications (Comment) (dizziness, low BP) Patient left: in bed;with call bell/phone within reach;with bed alarm set Nurse Communication: Mobility status;Other (comment) (low BP) PT Visit Diagnosis: Other abnormalities of gait and mobility (R26.89);Muscle weakness (generalized) (M62.81)     Time: 9295-7473 PT Time Calculation (min) (ACUTE ONLY): 22 min  Charges:  $Therapeutic Activity: 8-22 mins                     Marisa Severin, PT, DPT Acute Rehabilitation Services Pager [phone] Office  [place] 12/21/2020, 3:17 PM

## 2020-12-21 NOTE — Progress Notes (Signed)
Occupational Therapy Treatment Patient Details Name: Colton Bush MRN: [id number] DOB: 1955/09/10 Today's Date: 12/21/2020    History of present illness Pt is a 65 y.o. male admitted 12/15/20 found unresponsive by wife, pt hypoglycemic. Head CT negative for acute changes. CXR with known R pleural effusion. Workup for hypoglycemia, hypokalemia. Of note, pt with recent admission 11/05/20 with sepsis s/p burr hole evacuation of L SDH, with CIR admission 11/12/20-12/08/20, recent ED visits 5/12 and 5/16 for weakness. Other PMH includes HTN, hernia, scoliosis, ACDF (2013), R femur fx (09/2020).   OT comments  Pt progressing gradually towards OT goals. Pt able to demo transfer to [facility] at min guard using RW and Min A for toileting task completion. Pt continues to benefit from RW use to maintain balance (posterior sway when unsupported statically during ADLs).  Pt with mild lightheadedness that subsided during session but notable BP drop with initiation of activity. Pt able to take steps to recliner using RW and left up in chair with all needs within reach. Continue to recommend SNF at DC for short term rehab  BP at rest: 167/73 BP sitting EOB: 106/73 BP standing: 108/73   Follow Up Recommendations  SNF    Equipment Recommendations  Other (comment) (defer to next venue)    Recommendations for Other Services      Precautions / Restrictions Precautions Precautions: Fall;Other (comment) Precaution Comments: watch BP Restrictions Weight Bearing Restrictions: No       Mobility Bed Mobility Overal bed mobility: Needs Assistance Bed Mobility: Supine to Sit     Supine to sit: Supervision;HOB elevated     General bed mobility comments: Increased time and effort, use of bed rail    Transfers Overall transfer level: Needs assistance Equipment used: Rolling walker (2 wheeled) Transfers: Sit to/from [facility] Sit to Stand: Min guard Stand pivot transfers: Min guard        General transfer comment: min guard for safety to ensure balance, little external support needed when pt using RW. Cueing for sequencing, pt with slow pacing    Balance Overall balance assessment: Needs assistance Sitting-balance support: Feet supported;No upper extremity supported Sitting balance-[name]: Fair Sitting balance - Comments: supervision for safety.   Standing balance support: During functional activity;Bilateral upper extremity supported Standing balance-[name]: Poor Standing balance comment: use of BUE support for dynamic tasks, posterior sway when standing unsupported                           ADL either performed or assessed with clinical judgement   ADL Overall ADL's : Needs assistance/impaired                         Toilet Transfer: Min Geophysical data processor Details (indicate cue type and reason): min guard for safety due to minor lightheadedness, cues for RW sequencing Toileting- Clothing Manipulation and Hygiene: Minimal assistance;Sit to/from stand Toileting - Clothing Manipulation Details (indicate cue type and reason): Min A for steadying and clothing mgmt, able to complete majority of posterior hygiene after BM       General ADL Comments: Improving balance and abilities but continued deficits in endurance and unsupported standing during toileting tasks.     Vision   Vision Assessment?: No apparent visual deficits   Perception     Praxis      Cognition Arousal/Alertness: Awake/alert Behavior During Therapy: WFL for tasks assessed/performed;Flat affect Overall Cognitive Status: No family/caregiver  present to determine baseline cognitive functioning Area of Impairment: Memory;Following commands;Safety/judgement;Problem solving                     Memory: Decreased short-term memory Following Commands: Follows one step commands consistently;Follows one step commands with increased time;Follows  multi-step commands with increased time Safety/Judgement: Decreased awareness of deficits;Decreased awareness of safety Awareness: Emergent Problem Solving: Decreased initiation;Slow processing;Difficulty sequencing;Requires verbal cues General Comments: Appears appropriate for basic mobility tasks however does need repetition at times to follow motor commands that are multi step.        Exercises     Shoulder Instructions       General Comments BP at rest 167/73, 106/73 sitting EOB with mild lightheadedness reported, 108/73 after transfer    Pertinent Vitals/ Pain       Pain Assessment: No/denies pain Pain Intervention(s): Monitored during session  Home Living                                          Prior Functioning/Environment              Frequency  Min 2X/week        Progress Toward Goals  OT Goals(current goals can now be found in the care plan section)  Progress towards OT goals: Progressing toward goals  Acute Rehab OT Goals Patient Stated Goal: get better; interested in post-acute rehab OT Goal Formulation: With patient Time For Goal Achievement: 12/30/20 Potential to Achieve Goals: Fair ADL Goals Pt Will Perform Grooming: with min assist;with min guard assist;standing Pt Will Perform Upper Body Bathing: with supervision;with set-up;sitting Pt Will Perform Lower Body Bathing: with mod assist Pt Will Perform Upper Body Dressing: with supervision;with set-up;sitting Pt Will Transfer to Toilet: with min assist;with min guard assist;stand pivot transfer;ambulating Pt Will Perform Toileting - Clothing Manipulation and hygiene: with mod assist;sitting/lateral leans;sit to/from stand  Plan Discharge plan remains appropriate    Co-evaluation                 AM-PAC OT "6 Clicks" Daily Activity     Outcome Measure   Help from another person eating meals?: None Help from another person taking care of personal grooming?: A  Little Help from another person toileting, which includes using toliet, bedpan, or urinal?: A Little Help from another person bathing (including washing, rinsing, drying)?: A Lot Help from another person to put on and taking off regular upper body clothing?: A Little Help from another person to put on and taking off regular lower body clothing?: A Lot 6 Click Score: 17    End of Session Equipment Utilized During Treatment: Gait belt;Rolling walker  OT Visit Diagnosis: Unsteadiness on feet (R26.81);Other abnormalities of gait and mobility (R26.89);History of falling (Z91.81);Muscle weakness (generalized) (M62.81);Pain;Other symptoms and signs involving cognitive function Pain - Right/Left: Left Pain - part of body: Leg   Activity Tolerance Patient tolerated treatment well   Patient Left in chair;with call bell/phone within reach;with chair alarm set   Nurse Communication Mobility status        Time: 4431-5400 OT Time Calculation (min): 22 min  Charges: OT General Charges $OT Visit: 1 Visit OT Treatments $Self Care/Home Management : 8-22 mins  Malachy Chamber, OTR/L Acute Rehab Services Office: [phone]   Layla Maw 12/21/2020, 12:12 PM

## 2020-12-21 NOTE — Progress Notes (Signed)
I called and spoke with the patient and his wife. We will move his simulation out a few more weeks per Dr. [name] to allow for improved strength and nutrition. They are in agreement and have an appt in our office for  on 01/04/21 2:15pm check in for IV start, and 3:00 simulation.    Carola Rhine, PAC

## 2020-12-21 NOTE — TOC Progression Note (Signed)
Transition of Care [facility]) - Progression Note    Patient Details  Name: Colton Bush MRN: [id number] Date of Birth: 06-Sep-1955  Transition of Care [facility]) CM/SW [place], [place] Phone Number: 12/21/2020, 2:49 PM  Clinical Narrative:     CSW gave pt bed offers. Pt and wife chose [facility]. CSW confirmed [facility] can take pt at discharge. Pt had covid test on 5/23 and does not need to be repeated as long as pt does DC tomorrow. Pt does not need insurance auth.   Pt wife requested printed directions from her home to [facility].   Expected Discharge Plan: [place] Barriers to Discharge: Continued Medical Work up,SNF Pending bed offer  Expected Discharge Plan and Services Expected Discharge Plan: [place] Choice: [place] arrangements for the past 2 months: Single Family Home Expected Discharge Date: 12/21/20                                     Social Determinants of Health (SDOH) Interventions    Readmission Risk Interventions No flowsheet data found.  Emeterio Reeve, Latanya Presser, [place] Social Worker [phone]

## 2020-12-21 NOTE — Care Management Important Message (Signed)
Important Message  Patient Details  Name: TREMANE SPURGEON MRN: [id number] Date of Birth: 1956/07/21   Medicare Important Message Given:  Yes     Shelda Altes 12/21/2020, 9:33 AM

## 2020-12-22 ENCOUNTER — Inpatient Hospital Stay: Payer: Medicare Other | Attending: Hematology | Admitting: Hematology

## 2020-12-22 ENCOUNTER — Ambulatory Visit: Payer: Medicare Other | Admitting: Radiation Oncology

## 2020-12-22 ENCOUNTER — Other Ambulatory Visit: Payer: Medicare Other

## 2020-12-22 DIAGNOSIS — N401 Enlarged prostate with lower urinary tract symptoms: Secondary | ICD-10-CM | POA: Diagnosis not present

## 2020-12-22 DIAGNOSIS — K7689 Other specified diseases of liver: Secondary | ICD-10-CM | POA: Diagnosis not present

## 2020-12-22 DIAGNOSIS — B952 Enterococcus as the cause of diseases classified elsewhere: Secondary | ICD-10-CM | POA: Diagnosis present

## 2020-12-22 DIAGNOSIS — R531 Weakness: Secondary | ICD-10-CM | POA: Diagnosis not present

## 2020-12-22 DIAGNOSIS — D849 Immunodeficiency, unspecified: Secondary | ICD-10-CM | POA: Diagnosis present

## 2020-12-22 DIAGNOSIS — C250 Malignant neoplasm of head of pancreas: Secondary | ICD-10-CM | POA: Diagnosis present

## 2020-12-22 DIAGNOSIS — Z515 Encounter for palliative care: Secondary | ICD-10-CM | POA: Diagnosis not present

## 2020-12-22 DIAGNOSIS — I951 Orthostatic hypotension: Secondary | ICD-10-CM | POA: Diagnosis not present

## 2020-12-22 DIAGNOSIS — D72829 Elevated white blood cell count, unspecified: Secondary | ICD-10-CM | POA: Diagnosis not present

## 2020-12-22 DIAGNOSIS — E876 Hypokalemia: Secondary | ICD-10-CM | POA: Diagnosis not present

## 2020-12-22 DIAGNOSIS — R279 Unspecified lack of coordination: Secondary | ICD-10-CM | POA: Diagnosis not present

## 2020-12-22 DIAGNOSIS — Z20822 Contact with and (suspected) exposure to covid-19: Secondary | ICD-10-CM | POA: Diagnosis present

## 2020-12-22 DIAGNOSIS — R6889 Other general symptoms and signs: Secondary | ICD-10-CM | POA: Diagnosis not present

## 2020-12-22 DIAGNOSIS — D696 Thrombocytopenia, unspecified: Secondary | ICD-10-CM | POA: Diagnosis not present

## 2020-12-22 DIAGNOSIS — M47814 Spondylosis without myelopathy or radiculopathy, thoracic region: Secondary | ICD-10-CM | POA: Diagnosis not present

## 2020-12-22 DIAGNOSIS — Z209 Contact with and (suspected) exposure to unspecified communicable disease: Secondary | ICD-10-CM | POA: Diagnosis not present

## 2020-12-22 DIAGNOSIS — Z743 Need for continuous supervision: Secondary | ICD-10-CM | POA: Diagnosis not present

## 2020-12-22 DIAGNOSIS — S065X9D Traumatic subdural hemorrhage with loss of consciousness of unspecified duration, subsequent encounter: Secondary | ICD-10-CM | POA: Diagnosis not present

## 2020-12-22 DIAGNOSIS — C259 Malignant neoplasm of pancreas, unspecified: Secondary | ICD-10-CM | POA: Diagnosis not present

## 2020-12-22 DIAGNOSIS — D689 Coagulation defect, unspecified: Secondary | ICD-10-CM | POA: Diagnosis present

## 2020-12-22 DIAGNOSIS — G4089 Other seizures: Secondary | ICD-10-CM | POA: Diagnosis not present

## 2020-12-22 DIAGNOSIS — J90 Pleural effusion, not elsewhere classified: Secondary | ICD-10-CM | POA: Diagnosis not present

## 2020-12-22 DIAGNOSIS — R4182 Altered mental status, unspecified: Secondary | ICD-10-CM | POA: Diagnosis not present

## 2020-12-22 DIAGNOSIS — Z8249 Family history of ischemic heart disease and other diseases of the circulatory system: Secondary | ICD-10-CM | POA: Diagnosis not present

## 2020-12-22 DIAGNOSIS — I10 Essential (primary) hypertension: Secondary | ICD-10-CM | POA: Diagnosis not present

## 2020-12-22 DIAGNOSIS — I469 Cardiac arrest, cause unspecified: Secondary | ICD-10-CM | POA: Diagnosis not present

## 2020-12-22 DIAGNOSIS — G40909 Epilepsy, unspecified, not intractable, without status epilepticus: Secondary | ICD-10-CM | POA: Diagnosis present

## 2020-12-22 DIAGNOSIS — R627 Adult failure to thrive: Secondary | ICD-10-CM | POA: Diagnosis not present

## 2020-12-22 DIAGNOSIS — M6281 Muscle weakness (generalized): Secondary | ICD-10-CM | POA: Diagnosis not present

## 2020-12-22 DIAGNOSIS — R5381 Other malaise: Secondary | ICD-10-CM | POA: Diagnosis not present

## 2020-12-22 DIAGNOSIS — D638 Anemia in other chronic diseases classified elsewhere: Secondary | ICD-10-CM | POA: Diagnosis not present

## 2020-12-22 DIAGNOSIS — R5383 Other fatigue: Secondary | ICD-10-CM | POA: Diagnosis not present

## 2020-12-22 DIAGNOSIS — M19042 Primary osteoarthritis, left hand: Secondary | ICD-10-CM | POA: Diagnosis present

## 2020-12-22 DIAGNOSIS — Z7189 Other specified counseling: Secondary | ICD-10-CM | POA: Diagnosis not present

## 2020-12-22 DIAGNOSIS — Z1621 Resistance to vancomycin: Secondary | ICD-10-CM | POA: Diagnosis not present

## 2020-12-22 DIAGNOSIS — K750 Abscess of liver: Secondary | ICD-10-CM | POA: Diagnosis not present

## 2020-12-22 DIAGNOSIS — E782 Mixed hyperlipidemia: Secondary | ICD-10-CM | POA: Diagnosis not present

## 2020-12-22 DIAGNOSIS — N320 Bladder-neck obstruction: Secondary | ICD-10-CM | POA: Diagnosis not present

## 2020-12-22 DIAGNOSIS — R69 Illness, unspecified: Secondary | ICD-10-CM | POA: Diagnosis not present

## 2020-12-22 DIAGNOSIS — R0902 Hypoxemia: Secondary | ICD-10-CM | POA: Diagnosis not present

## 2020-12-22 DIAGNOSIS — D6959 Other secondary thrombocytopenia: Secondary | ICD-10-CM | POA: Diagnosis present

## 2020-12-22 DIAGNOSIS — E11649 Type 2 diabetes mellitus with hypoglycemia without coma: Secondary | ICD-10-CM | POA: Diagnosis not present

## 2020-12-22 DIAGNOSIS — R410 Disorientation, unspecified: Secondary | ICD-10-CM | POA: Diagnosis not present

## 2020-12-22 DIAGNOSIS — F419 Anxiety disorder, unspecified: Secondary | ICD-10-CM | POA: Diagnosis not present

## 2020-12-22 DIAGNOSIS — K219 Gastro-esophageal reflux disease without esophagitis: Secondary | ICD-10-CM | POA: Diagnosis present

## 2020-12-22 DIAGNOSIS — R7881 Bacteremia: Secondary | ICD-10-CM | POA: Diagnosis present

## 2020-12-22 DIAGNOSIS — F339 Major depressive disorder, recurrent, unspecified: Secondary | ICD-10-CM | POA: Diagnosis not present

## 2020-12-22 DIAGNOSIS — E162 Hypoglycemia, unspecified: Secondary | ICD-10-CM | POA: Diagnosis not present

## 2020-12-22 DIAGNOSIS — N400 Enlarged prostate without lower urinary tract symptoms: Secondary | ICD-10-CM | POA: Diagnosis present

## 2020-12-22 DIAGNOSIS — L89616 Pressure-induced deep tissue damage of right heel: Secondary | ICD-10-CM | POA: Diagnosis present

## 2020-12-22 DIAGNOSIS — Z66 Do not resuscitate: Secondary | ICD-10-CM | POA: Diagnosis present

## 2020-12-22 DIAGNOSIS — B961 Klebsiella pneumoniae [K. pneumoniae] as the cause of diseases classified elsewhere: Secondary | ICD-10-CM | POA: Diagnosis present

## 2020-12-22 DIAGNOSIS — E1165 Type 2 diabetes mellitus with hyperglycemia: Secondary | ICD-10-CM | POA: Diagnosis present

## 2020-12-22 DIAGNOSIS — F32A Depression, unspecified: Secondary | ICD-10-CM | POA: Diagnosis present

## 2020-12-22 DIAGNOSIS — R7989 Other specified abnormal findings of blood chemistry: Secondary | ICD-10-CM | POA: Diagnosis not present

## 2020-12-22 DIAGNOSIS — I5032 Chronic diastolic (congestive) heart failure: Secondary | ICD-10-CM | POA: Diagnosis not present

## 2020-12-22 DIAGNOSIS — R197 Diarrhea, unspecified: Secondary | ICD-10-CM | POA: Diagnosis not present

## 2020-12-22 DIAGNOSIS — I2510 Atherosclerotic heart disease of native coronary artery without angina pectoris: Secondary | ICD-10-CM | POA: Diagnosis not present

## 2020-12-22 DIAGNOSIS — J869 Pyothorax without fistula: Secondary | ICD-10-CM | POA: Diagnosis present

## 2020-12-22 DIAGNOSIS — M1711 Unilateral primary osteoarthritis, right knee: Secondary | ICD-10-CM | POA: Diagnosis not present

## 2020-12-22 DIAGNOSIS — E119 Type 2 diabetes mellitus without complications: Secondary | ICD-10-CM | POA: Diagnosis not present

## 2020-12-22 DIAGNOSIS — E785 Hyperlipidemia, unspecified: Secondary | ICD-10-CM | POA: Diagnosis present

## 2020-12-22 LAB — GLUCOSE, CAPILLARY
Glucose-Capillary: 148 mg/dL — ABNORMAL HIGH (ref 70–99)
Glucose-Capillary: 206 mg/dL — ABNORMAL HIGH (ref 70–99)

## 2020-12-22 NOTE — Plan of Care (Signed)

## 2020-12-22 NOTE — Progress Notes (Signed)
D/C instructions printed and placed in packet at nurse's station. Tele and IV removed, tolerated well. Awaiting PTAR.

## 2020-12-22 NOTE — TOC Transition Note (Addendum)
Transition of Care [facility]) - CM/SW Discharge Note   Patient Details  Name: Colton Bush MRN: [id number] Date of Birth: Jul 07, 1956  [facility]) CM/SW Contact:  Tresa Endo Phone Number: 12/22/2020, 9:54 AM   Clinical Narrative:    Patient will DC to: [facility] Anticipated DC date: 12/22/2020 Family notified: Pt Spouse Transport by: Corey Harold   Per MD patient ready for DC to [facility]. RN to call report prior to discharge [phone]). RN, patient, patient's family, and facility notified of DC. Discharge Summary and FL2 sent to facility. DC packet on chart. Ambulance transport requested for patient.   CSW will sign off for now as social work intervention is no longer needed. Please consult [place] again if new needs arise.        Barriers to Discharge: Continued Medical Work up,SNF Pending bed offer   Patient Goals and CMS Choice Patient states their goals for this hospitalization and ongoing recovery are:: Pt states he would like to go to SNF to get stronger, would like to return home. CMS Medicare.gov Compare Post Acute Care list provided to:: Patient Choice offered to / list presented to : Patient  Discharge Placement                       Discharge Plan and Services     Post Acute Care Choice: [place]                               Social Determinants of Health (SDOH) Interventions     Readmission Risk Interventions No flowsheet data found.

## 2020-12-22 NOTE — Discharge Summary (Signed)
Physician Discharge Summary  Colton Bush HYW:[id number] DOB: February 11, 1956 DOA: 12/15/2020  Patient seen examined and agree with d/c plan for todfay 5/25 D/w Wife at bedside HE has no new issues  PCP: [name], MD  Admit date: 12/15/2020 Discharge date: 12/22/2020  Admitted From: [facility] --> Home  Disposition:  SNF   Recommendations for Outpatient Follow-up:  1. Follow up with PCP in 1 week  Discharge Condition: Stable CODE STATUS: Full  Diet recommendation: Regular diet   Brief/Interim Summary: Colton Bush a 65 y.o.malewith medical history significant ofhypertension, hyperlipidemia, recurrent pancreatic cancer who presents after his wife found him unresponsive this morning. Most of the history is obtained from the patient's wife who is present at bedside. She states that normally he is up before she is, but this morning he was still in bed. After little while she went and checked on him and noticed that he was drooling of the side of his mouth and had sonorous respirations. Denied seeing any seizure-like activity. Attempted multiple times to wake him and thereafter called 911. They found his blood sugar to be around 41. Patient had last taken Levemir approximately 20 units the night before, but did not eat dinner. Since he had been home from rehab he reportedly had been eating regularly. However, patient reports that he recently he had just started feeling bad over the last 2 to 3 days. Wife also reports that over the last 2 to 3 days his blood sugars that intermittently been dropping into the 60-70sat home. After he was discharged from rehab he reported that they sent him home on MS Contin as well as hydrocodone, but he did not like the way the made him feel and has not really taken it.  Patient just recently been hospitalized for right hip fracture status post intramedullary nailin3/2022. Hishospital course was complicated E. coli UTI along with strep anginosis  bacteremiain 1 of 4 blood cultures treated with Rocephin and then amoxicillin for total of 7 days. Patient was sent to a rehab facility,but subsequently had to be hospitalized from 4/8-4/15 with severe sepsis found to have liver abscesses status post JP drain x2and rightempyema. During his hospitalization patient was noted to be acutely confused for which imaging studies of the brain revealed subdural hematomas requiring burr holes. He was seen by ID and recommended to continue on Rocephin and oral metronidazole to complete a total of 6 weeks (5/24). Patient was noted to have concern for probable seizures for which he was started on Keppra.Patient was hewent to inpatient rehab from4/15-5/11. After that he was able to be discharged from rehab he presented back to the emergency department on 5/12 and again on 5/16 for weakness.  He was now hospitalized on 5/18 for hypoglycemia.  Patient's insulin regimen was adjusted.  Diabetic coordinator consulted.  Due to leukocytosis and persistent empyema, infectious disease was consulted.  After reviewing imaging, IR felt that empyema was too small for percutaneous drainage.  Antibiotic was changed from Rocephin/Flagyl to linezolid.  Patient was discharged to SNF.  Discharge Diagnoses:  Principal Problem:   Empyema lung ([place]) Active Problems:   Hyperlipidemia, mixed   Essential hypertension   GERD without esophagitis   Hypoglycemia   Liver abscess   Bladder outlet obstruction   Hypokalemia   Hypoglycemia with history of diabetes mellitus type 2, poorly controlled -Hypoglycemia resolved -Continue Levemir, sliding scale insulin - levemir dose decreased  -Appreciate diabetic coordinator  Leukocytosis -Blood cultures negative to date -UA negative, urine culture  with multiple species -CT A/P: Subtle/questionable edema within the medial cortex of the RIGHT kidney, and new fluid stranding overlying the upper pole of the RIGHT kidney. This may  represent mild/early pyelonephritis. Complex/fluid collection at the RIGHT lung base is partially imaged, more completely imaged on earlier abdomen CT of 11/25/2020, compatible with previously described empyema.  -Resolved  Empyema -CT A/P: Complex/fluid collection at the RIGHT lung base is partially imaged, more completely imaged on earlier abdomen CT of 11/25/2020, compatible with previously described empyema. Consider complete characterization with chest CT. -Chest CT with persistent small right-sided empyema -Appreciate infectious disease -IR consulted, after reviewing images, they feel the empyema is too small for percutaneous drainage at this time -Continue linezolid for 4 weeks starting 5/19-6/15.  Repeat CT chest around 3 weeks to ensure empyema is resolved.  Monitor CBC weekly.  Follow-up with infectious disease as outpatient  Liver abscesses -Has been status post JP drain x2 -CT A/P: No recurrent abscess -Rocephin and Flagyl discontinued, PICC removed   Recurrent pancreatic cancer -History of Whipple procedure status post chemo and radiation -Continue Creon -Follows with Dr. [name] appreciate her stopping by  Orthostatic hypotension  -Continue midodrine  Seizure disorder -Continue Keppra  History of subdural hematoma status post bur hole 4/12 -Repeat CT head 5/16: No significant change in size or appearance of thin left convexity subdural hematoma without significant mass effect or midline shift. No acute interval change since 12/10/2020.  BPH  -Continue Flomax -Foley catheter removed 5/23.  He has had 3 urinary voiding.  He required one-time straight cath last night.  Recent right hip fracture status postrepair by intramedullary nail 09/2020 -PT OT recommending SNF placement  Depression/anxiety -Celexa dosing increased    In agreement with assessment of the pressure ulcer as below:  Pressure Injury 11/07/20 Heel Right Deep Tissue Pressure Injury - Purple  or maroon localized area of discolored intact skin or blood-filled blister due to damage of underlying soft tissue from pressure and/or shear. (Active)  11/07/20 2000  Location: Heel  Location Orientation: Right  Staging: Deep Tissue Pressure Injury - Purple or maroon localized area of discolored intact skin or blood-filled blister due to damage of underlying soft tissue from pressure and/or shear.  Wound Description (Comments):   Present on Admission:      Pressure Injury 12/15/20 Heel Right Deep Tissue Pressure Injury - Purple or maroon localized area of discolored intact skin or blood-filled blister due to damage of underlying soft tissue from pressure and/or shear. (Active)  12/15/20 1800  Location: Heel  Location Orientation: Right  Staging: Deep Tissue Pressure Injury - Purple or maroon localized area of discolored intact skin or blood-filled blister due to damage of underlying soft tissue from pressure and/or shear.  Wound Description (Comments):   Present on Admission: Yes       Discharge Instructions  Discharge Instructions    Diet general   Complete by: As directed    Increase activity slowly   Complete by: As directed    No wound care   Complete by: As directed      Allergies as of 12/22/2020   No Known Allergies     Medication List    STOP taking these medications   cefTRIAXone  IVPB Commonly known as: ROCEPHIN   metroNIDAZOLE 500 MG tablet Commonly known as: FLAGYL   morphine 30 MG 12 hr tablet Commonly known as: MS CONTIN   potassium chloride 20 MEQ packet Commonly known as: KLOR-CON     TAKE  these medications   acetaminophen 325 MG tablet Commonly known as: TYLENOL Take 1-2 tablets (325-650 mg total) by mouth every 4 (four) hours as needed for mild pain.   ascorbic acid 500 MG tablet Commonly known as: VITAMIN C Take 1 tablet (500 mg total) by mouth daily.   citalopram 40 MG tablet Commonly known as: CELEXA Take 1 tablet (40 mg total) by  mouth daily. What changed:   medication strength  how much to take   diclofenac Sodium 1 % Gel Commonly known as: VOLTAREN Apply 2 g topically 4 (four) times daily. To right knee   hydrocerin Crea Apply 1 application topically 2 (two) times daily. To legs and back   HYDROcodone-acetaminophen 5-325 MG tablet Commonly known as: NORCO/VICODIN Take 1 tablet by mouth every 3 (three) hours as needed for severe pain.   hydrocortisone cream 1 % Apply topically 2 (two) times daily. To back/areas of itching   insulin detemir 100 UNIT/ML FlexPen Commonly known as: LEVEMIR Inject 14 Units into the skin daily. What changed:   how much to take  when to take this   levETIRAcetam 1000 MG tablet Commonly known as: KEPPRA Take 1 tablet (1,000 mg total) by mouth 2 (two) times daily.   lidocaine 5 % Commonly known as: LIDODERM Place 1 patch onto the skin daily. Apply to lower back at 8 pm and remove at 8 am daily   linezolid 600 MG tablet Commonly known as: ZYVOX Take 1 tablet (600 mg total) by mouth every 12 (twelve) hours for 22 days.   loperamide 2 MG capsule Commonly known as: IMODIUM Take 2 mg by mouth daily as needed for diarrhea or loose stools.   loratadine 10 MG tablet Commonly known as: CLARITIN Take 1 tablet (10 mg total) by mouth daily.   midodrine 5 MG tablet Commonly known as: PROAMATINE Take 1 tablet (5 mg total) by mouth 3 (three) times daily with meals.   multivitamin with minerals Tabs tablet Take 1 tablet by mouth daily.   NovoLOG FlexPen 100 UNIT/ML FlexPen Generic drug: insulin aspart Inject 2-13 Units into the skin 3 (three) times daily with meals. Sliding Scale insulin for blood sugars from 101-150 - 2 units. For blood sugars from 151-200 - 3 units. For blood sugars from 201-250 - 5 units. For blood sugars from 301-350 use 7 units. Call MD if blood sugars are over 300.   omeprazole 20 MG capsule Commonly known as: PRILOSEC TAKE 1 CAPSULE DAILY    ondansetron 4 MG disintegrating tablet Commonly known as: ZOFRAN-ODT Take 1 tablet (4 mg total) by mouth every 6 (six) hours as needed for nausea or vomiting (can alternate with compazine).   Pancrelipase (Lip-Prot-Amyl) 24000-76000 units Cpep Take 1 capsule (24,000 Units total) by mouth 3 (three) times daily before meals.   Pen Needles 32G X 6 MM Misc 1 application by Does not apply route as needed.   polycarbophil 625 MG tablet Commonly known as: FIBERCON Take 1 tablet (625 mg total) by mouth 2 (two) times daily.   polyethylene glycol 17 g packet Commonly known as: MIRALAX / GLYCOLAX Take 17 g by mouth daily as needed for mild constipation.   simethicone 80 MG chewable tablet Commonly known as: MYLICON Chew 1 tablet (80 mg total) by mouth 4 (four) times daily -  with meals and at bedtime.   tamsulosin 0.4 MG Caps capsule Commonly known as: FLOMAX Take 2 capsules (0.8 mg total) by mouth daily.   zinc sulfate 220 (50  Zn) MG capsule Take 1 capsule (220 mg total) by mouth daily.       Contact information for follow-up providers    [name], MD. Schedule an appointment as soon as possible for a visit in 1 week(s).   Specialty: Family Medicine Contact information: [place] [address] [phone]        [facility]             . Schedule an appointment as soon as possible for a visit in 3 week(s).   Contact information: [address] [place] [address]           Contact information for after-discharge care    Destination    [facility] Preferred SNF .   Service: Skilled Nursing Contact information: [address] [place] [phone]                 No Known Allergies  Consultations:  ID    Procedures/Studies: DG Chest 2 View  Result Date: 12/13/2020 CLINICAL DATA:  Weakness EXAM: CHEST - 2 VIEW COMPARISON:  October 09, 2020 FINDINGS: Right upper extremity PICC with tip overlying the superior cavoatrial junction. The heart size and mediastinal contours are within normal limits. Increased conspicuity of the they density overlying the right midlung. Left lung is clear. The visualized skeletal structures are unremarkable. IMPRESSION: Increased conspicuity of they density overlying the right midlung, corresponding with the posterior loculated fluid collection. Electronically Signed   By: Dahlia Bailiff MD   On: 12/13/2020 21:07   DG Chest 2 View  Result Date: 12/09/2020 CLINICAL DATA:  Weakness EXAM: CHEST - 2 VIEW COMPARISON:  11/19/2020 FINDINGS: Cardiac shadow is within normal limits. Patchy airspace opacity is again seen in the right mid lung stable in appearance from the prior exam consistent with some loculated pleural fluid along the posterior margin. Right-sided PICC line is noted in satisfactory position at the cavoatrial junction. The left lung is clear. No bony abnormality is noted. IMPRESSION: Right-sided PICC line in satisfactory position. Vague density overlying the right mid lung which corresponds to posterior loculated fluid. Electronically Signed   By: Inez Catalina M.D.   On: 12/09/2020 17:37   DG Abd 1 View  Result Date: 12/02/2020 CLINICAL DATA:  Nausea and vomiting EXAM: ABDOMEN - 1 VIEW COMPARISON:  11/25/2020 FINDINGS: Scattered large and small bowel gas is noted. No obstructive changes are seen. No free air is noted. Postsurgical changes are noted in the right hip and mid abdomen. Degenerative changes of lumbar spine are seen. Bone island is noted within the left iliac bone. IMPRESSION: Postsurgical changes without acute abnormality. Electronically Signed   By: Inez Catalina M.D.   On: 12/02/2020 15:55   CT Head Wo Contrast  Result Date: 12/13/2020 CLINICAL DATA:  Head trauma fall EXAM: CT HEAD WITHOUT CONTRAST TECHNIQUE: Contiguous axial images were obtained from the base of the skull through the  vertex without intravenous contrast. COMPARISON:  CT brain 12/10/2020 FINDINGS: Brain: No acute territorial infarction, mass or new hemorrhage is visualized. Left hemispheric subdural hematoma without significant change since 12/10/2020, measures 3 mm maximum. No midline shift. Mild atrophy and chronic small vessel ischemic changes of the white matter. Vascular: No hyperdense vessels.  No unexpected calcification Skull: Left frontal burr hole.  No fracture Sinuses/Orbits: No acute finding. Other: None IMPRESSION: 1. No significant change in size or appearance of thin left convexity subdural  hematoma without significant mass effect or midline shift. No acute interval change since 12/10/2020. 2. Atrophy and chronic small vessel ischemic change of the white matter Electronically Signed   By: Donavan Foil M.D.   On: 12/13/2020 22:05   CT Head Wo Contrast  Result Date: 12/10/2020 CLINICAL DATA:  Altered mental status EXAM: CT HEAD WITHOUT CONTRAST TECHNIQUE: Contiguous axial images were obtained from the base of the skull through the vertex without intravenous contrast. COMPARISON:  11/07/2020 FINDINGS: Brain: Previously seen left subdural hematoma is decreased since prior study, now measuring 3 mm in thickness compared with 10 mm previously. No mass effect or midline shift. No new hemorrhage. No acute infarct. No hydrocephalus. Vascular: No hyperdense vessel or unexpected calcification. Skull: No acute calvarial abnormality. Burr hole in the left frontal region. Sinuses/Orbits: No acute findings Other: None IMPRESSION: Small residual left subdural hematoma, decreased significantly in size since prior study, now 3 mm in thickness maximally. No mass effect or midline shift. Electronically Signed   By: Rolm Baptise M.D.   On: 12/10/2020 09:36   CT CHEST WO CONTRAST  Result Date: 12/16/2020 CLINICAL DATA:  Evaluate empyema EXAM: CT CHEST WITHOUT CONTRAST TECHNIQUE: Multidetector CT imaging of the chest was  performed following the standard protocol without IV contrast. COMPARISON:  CT of the abdomen and pelvis from 12/11/2018, chest x-ray from the previous day. FINDINGS: Cardiovascular: Somewhat limited due to lack of IV contrast. Aortic calcifications are noted. Coronary calcifications are seen. Right-sided PICC line is noted in satisfactory position. Mediastinum/Nodes: Thoracic inlet is within normal limits. The esophagus as visualized is within normal limits. No sizable hilar or mediastinal adenopathy is noted. Lungs/Pleura: Lungs are well aerated bilaterally. The left lung shows only minimal scarring in the base. Right lung shows some lower lobe atelectasis which is increased slightly in the interval from the prior CT of 12/10/2020. Additionally, small right-sided pleural effusion is noted which is increased slightly in the interval from the prior exam. It has some thickened margins suggestive of empyema. The thickened margins suggest underlying empyema. A small focus of air is noted within which may be related to prior thoracentesis. No pneumothorax is seen. Upper Abdomen: Postsurgical changes are noted in the stomach. No other focal abnormality is noted in the upper abdomen. Previously seen fluid collection in the right liver posteriorly is not well appreciated on today's exam. Musculoskeletal: Degenerative changes of the thoracic spine are noted. No acute rib abnormality is seen. Postsurgical changes in the cervical spine are noted. No compression deformities are seen. IMPRESSION: Small fluid collection in the posterior aspect of the right pleural space with thickened margins most consistent with a small empyema. A small focus of air is noted within likely related to prior intervention. Slight increase in lower lobe atelectasis on the right when compared with the prior exam. No other acute abnormality is noted. Aortic Atherosclerosis (ICD10-I70.0). Electronically Signed   By: Inez Catalina M.D.   On: 12/16/2020  15:43   CT ABDOMEN PELVIS W CONTRAST  Result Date: 12/10/2020 CLINICAL DATA:  Generalized weakness. Infection suspected rule out abscess. EXAM: CT ABDOMEN AND PELVIS WITH CONTRAST TECHNIQUE: Multidetector CT imaging of the abdomen and pelvis was performed using the standard protocol following bolus administration of intravenous contrast. CONTRAST:  55mL OMNIPAQUE IOHEXOL 300 MG/ML  SOLN COMPARISON:  CT abdomen dated 11/25/2020. CT abdomen dated 11/06/2020. FINDINGS: Lower chest: Complex/fluid collection at the RIGHT lung base is partially imaged. LEFT lung bases clear. Hepatobiliary: Status post cholecystectomy. Stable intrahepatic  and extrahepatic bile duct dilatation. RIGHT hepatic drainage catheters the abdomen removed. No recurrent abscess. Small residual fluid stranding underlying the RIGHT liver lobe. Pancreas: Atrophic.  No acute findings. Spleen: Normal in size without focal abnormality. Adrenals/Urinary Tract: Adrenal glands appear normal. Subtle/questionable edema within the medial cortex of the RIGHT kidney. New fluid stranding overlying the upper pole of the RIGHT kidney. No renal stone or hydronephrosis bilaterally. Foley catheter within the bladder. Stomach/Bowel: No dilated large or small bowel loops. Surgical anastomosis at the stomach antrum, stable. No evidence of active bowel wall thickening/inflammation. Appendix is unremarkable. Vascular/Lymphatic: Aortic atherosclerosis. No acute-appearing vascular abnormality. No enlarged lymph nodes are seen. Reproductive: Prostate is unremarkable. Other: No abscess collection or free intraperitoneal air. Musculoskeletal: No acute-appearing osseous abnormality. Degenerative spondylosis of the thoracolumbar spine, mild to moderate in degree. Superficial soft tissues of the abdomen and and pelvis are unremarkable. IMPRESSION: 1. Subtle/questionable edema within the medial cortex of the RIGHT kidney, and new fluid stranding overlying the upper pole of the  RIGHT kidney. This may represent mild/early pyelonephritis. Recommend correlation with urinalysis. 2. Complex/fluid collection at the RIGHT lung base is partially imaged, more completely imaged on earlier abdomen CT of 11/25/2020, compatible with previously described empyema. Consider complete characterization with chest CT. 3. No other acute or significant findings within the abdomen or pelvis. No bowel obstruction or evidence of active bowel wall thickening/inflammation. No abscess collection or free intraperitoneal air. 4. Previously placed drainage catheters within the posterior RIGHT liver lobe have been removed. No recurrent abscess. Aortic Atherosclerosis (ICD10-I70.0). Electronically Signed   By: Franki Cabot M.D.   On: 12/10/2020 12:07   CT ABDOMEN PELVIS W CONTRAST  Result Date: 11/25/2020 CLINICAL DATA:  Intra-abdominal abscess, hepatic abscesses post percutaneous drainage. EXAM: CT ABDOMEN AND PELVIS WITH CONTRAST TECHNIQUE: Multidetector CT imaging of the abdomen and pelvis was performed using the standard protocol following bolus administration of intravenous contrast. CONTRAST:  136mL OMNIPAQUE IOHEXOL 300 MG/ML  SOLN COMPARISON:  November 06, 2020 FINDINGS: Lower chest: Interval development of pleural fluid with peripheral enhancement in the RIGHT lung base. Basilar atelectasis. No LEFT-sided effusion. Pleural fluid measuring 9.6 x 4.4 cm. Hepatobiliary: Atrophic LEFT hepatic lobe with biliary duct dilation. The soft tissue extending into the hepatic hilum along the course of hepatic vasculature. The SMV and main portal vein remain patent with distortion of the portal vein as on recent imaging including outside imaging from [facility] which was acquired on August 31, 2020. Occlusion of intrahepatic LEFT portal vein is similar to prior imaging. There is some biliary duct dilation also affecting the RIGHT hepatic lobe to a lesser extent. Hepatic abscesses and or biloma is that were drained  have resolved. Drainage catheters remain in place in the RIGHT hemi liver. Pancreas: Post Whipple procedure. No pancreatic ductal dilation. Soft tissue about the celiac and extending in the porta hepatis as described. Spleen: Spleen is normal. Adrenals/Urinary Tract: Adrenal glands are normal. Symmetric renal enhancement.  No hydronephrosis. Stomach/Bowel: Post Whipple with alteration of gastrointestinal anatomy as expected. No acute small bowel process. Colon is filled with liquid stool and is nondistended. Appendix is normal. Vascular/Lymphatic: No discrete adenopathy in the upper abdomen with soft tissue about the celiac axis. No retroperitoneal adenopathy. No mesenteric adenopathy. LEFT portal occlusion. Irregularity of the common hepatic artery and celiac axis due to surrounding ill-defined soft tissue. Reproductive: Unremarkable Other: No ascites.  No free air. Musculoskeletal: Spinal degenerative changes. Osteopenia. Post RIGHT femoral ORIF, recent postoperative changes with  mild surrounding stranding, not unexpected and not significantly changed. IMPRESSION: 1. Findings of empyema in the RIGHT chest with associated airspace disease. This has developed since prior imaging of April 9th of 2022 but is decreased in size based on comparison with chest x-ray from November 18, 2020 following aspiration. 2. Hepatic abscesses and or biloma, post drainage now resolved. 3. Post Whipple procedure with soft tissue about the celiac axis and common hepatic artery. Similar appearance of atrophy of the LEFT hepatic lobe. Findings are suspicious for disease recurrence with chronic occlusion of LEFT portal vein and extensive involvement of the celiac axis. 4. Post RIGHT femoral ORIF, recent postoperative changes with mild surrounding stranding, not unexpected and not significantly changed. These results will be called to the ordering clinician or representative by the Radiologist Assistant, and communication documented in the  PACS or [employer]. Electronically Signed   By: Zetta Bills M.D.   On: 11/25/2020 17:26   DG Chest Port 1 View  Result Date: 12/15/2020 CLINICAL DATA:  sob EXAM: PORTABLE CHEST 1 VIEW COMPARISON:  Radiograph 12/13/2020, CT 12/10/2020 FINDINGS: Unchanged cardiomediastinal silhouette. Unchanged right upper extremity PICC. Unchanged opacity overlying the right mid lung corresponding to the known posterior loculated pleural collection. There is no new airspace disease. Bones are unchanged. IMPRESSION: Unchanged right midlung opacity corresponding to known right pleural collection. No new airspace disease. Electronically Signed   By: Maurine Simmering   On: 12/15/2020 12:52   DG Knee Complete 4 Views Right  Result Date: 12/13/2020 CLINICAL DATA:  Fall with right knee swelling EXAM: RIGHT KNEE - COMPLETE 4+ VIEW COMPARISON:  October 19, 2020 FINDINGS: Partially visualized femoral intramedullary rod and screw fixation without evidence of hardware loosening or perihardware fracture within the visualized portions. No evidence of acute fracture or dislocation. Small joint effusion. Severe tricompartment degenerative change with chondrocalcinosis similar prior. Deformity of the proximal right fibula consistent with remote healed fracture. IMPRESSION: 1. No acute fracture or dislocation. 2. Small joint effusion. 3. Severe tricompartment degenerative change with chondrocalcinosis. Electronically Signed   By: Dahlia Bailiff MD   On: 12/13/2020 21:01   [place] EKG SITE RITE  Result Date: 12/06/2020 If Site Rite image not attached, placement could not be confirmed due to current cardiac rhythm.      Discharge Exam: Vitals:   12/22/20 0534 12/22/20 0800  BP:  (!) 147/74  Pulse: (!) 52 68  Resp:  18  Temp: 97.6 F (36.4 C)   SpO2: 96% 98%    Awake pleasant coherent in nad cta b rom intact UE's and LE's  Finger nose finger intact s1 s2 no M   The results of significant diagnostics from this  hospitalization (including imaging, microbiology, ancillary and laboratory) are listed below for reference.     Microbiology: Recent Results (from the past 240 hour(s))  Resp Panel by RT-PCR (Flu A&B, Covid) Nasopharyngeal Swab     Status: None   Collection Time: 12/15/20 11:52 AM   Specimen: Nasopharyngeal Swab; Nasopharyngeal(NP) swabs in vial transport medium  Result Value Ref Range Status   SARS Coronavirus 2 by RT PCR NEGATIVE NEGATIVE Final    Comment: (NOTE) SARS-CoV-2 target nucleic acids are NOT DETECTED.  The SARS-CoV-2 RNA is generally detectable in upper respiratory specimens during the acute phase of infection. The lowest concentration of SARS-CoV-2 viral copies this assay can detect is 138 copies/mL. A negative result does not preclude SARS-Cov-2 infection and should not be used as the sole basis for treatment or other  patient management decisions. A negative result may occur with  improper specimen collection/handling, submission of specimen other than nasopharyngeal swab, presence of viral mutation(s) within the areas targeted by this assay, and inadequate number of viral copies(<138 copies/mL). A negative result must be combined with clinical observations, patient history, and epidemiological information. The expected result is Negative.  Fact Sheet for Patients:  EntrepreneurPulse.com.au  Fact Sheet for Healthcare Providers:  IncredibleEmployment.be  This test is no t yet approved or cleared by the [place] FDA and  has been authorized for detection and/or diagnosis of SARS-CoV-2 by FDA under an Emergency Use Authorization (EUA). This EUA will remain  in effect (meaning this test can be used) for the duration of the COVID-19 declaration under Section 564(b)(1) of the Act, 21 U.S.C.section 360bbb-3(b)(1), unless the authorization is terminated  or revoked sooner.       Influenza A by PCR NEGATIVE NEGATIVE Final    Influenza B by PCR NEGATIVE NEGATIVE Final    Comment: (NOTE) The Xpert Xpress SARS-CoV-2/FLU/RSV plus assay is intended as an aid in the diagnosis of influenza from Nasopharyngeal swab specimens and should not be used as a sole basis for treatment. Nasal washings and aspirates are unacceptable for Xpert Xpress SARS-CoV-2/FLU/RSV testing.  Fact Sheet for Patients: EntrepreneurPulse.com.au  Fact Sheet for Healthcare Providers: IncredibleEmployment.be  This test is not yet approved or cleared by the [place] FDA and has been authorized for detection and/or diagnosis of SARS-CoV-2 by FDA under an Emergency Use Authorization (EUA). This EUA will remain in effect (meaning this test can be used) for the duration of the COVID-19 declaration under Section 564(b)(1) of the Act, 21 U.S.C. section 360bbb-3(b)(1), unless the authorization is terminated or revoked.  Performed at [facility], [place] [address], [place], [place] [address]   Culture, blood (routine x 2)     Status: None   Collection Time: 12/15/20 12:00 PM   Specimen: BLOOD LEFT HAND  Result Value Ref Range Status   Specimen Description BLOOD LEFT HAND  Final   Special Requests   Final    BOTTLES DRAWN AEROBIC AND ANAEROBIC Blood Culture results may not be optimal due to an inadequate volume of blood received in culture bottles   Culture   Final    NO GROWTH 5 DAYS Performed at [facility], [place] [address] [place], [place] [address]    Report Status 12/20/2020 FINAL  Final  Culture, blood (routine x 2)     Status: None   Collection Time: 12/15/20 12:06 PM   Specimen: BLOOD RIGHT HAND  Result Value Ref Range Status   Specimen Description BLOOD RIGHT HAND  Final   Special Requests   Final    BOTTLES DRAWN AEROBIC AND ANAEROBIC Blood Culture results may not be optimal due to an inadequate volume of blood received in culture bottles   Culture   Final    NO GROWTH 5  DAYS Performed at [facility], [place] [address], [place], [place] [address]    Report Status 12/20/2020 FINAL  Final  Culture, Urine     Status: Abnormal   Collection Time: 12/16/20  2:24 PM   Specimen: Urine, Random  Result Value Ref Range Status   Specimen Description URINE, RANDOM  Final   Special Requests   Final    NONE Performed at [facility], [place] [address], [place], [place] [address]    Culture MULTIPLE SPECIES PRESENT, SUGGEST RECOLLECTION (A)  Final  Report Status 12/17/2020 FINAL  Final  SARS CORONAVIRUS 2 (TAT 6-24 HRS) Nasopharyngeal Nasopharyngeal Swab     Status: None   Collection Time: 12/20/20 11:53 AM   Specimen: Nasopharyngeal Swab  Result Value Ref Range Status   SARS Coronavirus 2 NEGATIVE NEGATIVE Final    Comment: (NOTE) SARS-CoV-2 target nucleic acids are NOT DETECTED.  The SARS-CoV-2 RNA is generally detectable in upper and lower respiratory specimens during the acute phase of infection. Negative results do not preclude SARS-CoV-2 infection, do not rule out co-infections with other pathogens, and should not be used as the sole basis for treatment or other patient management decisions. Negative results must be combined with clinical observations, patient history, and epidemiological information. The expected result is Negative.  Fact Sheet for Patients: SugarRoll.be  Fact Sheet for Healthcare Providers: https://www.woods-mathews.com/  This test is not yet approved or cleared by the [place] FDA and  has been authorized for detection and/or diagnosis of SARS-CoV-2 by FDA under an Emergency Use Authorization (EUA). This EUA will remain  in effect (meaning this test can be used) for the duration of the COVID-19 declaration under Se ction 564(b)(1) of the Act, 21 U.S.C. section 360bbb-3(b)(1), unless the authorization is terminated or revoked sooner.  Performed at [facility], [place] [address], [place], [place] [address]      Labs: BNP (last 3 results) No results for input(s): BNP in the last 8760 hours. Basic Metabolic Panel: Recent Labs  Lab 12/15/20 1610 12/16/20 0348 12/17/20 0806 12/18/20 0310 12/19/20 0133 12/20/20 0450  NA  --  135 136 135 135 135  K  --  3.5 2.9* 3.9 3.7 3.6  CL  --  100 102 100 100 101  CO2  --  27 30 29 28 30   GLUCOSE  --  286* 70 136* 190* 232*  BUN  --  7* 5* 6* 7* 7*  CREATININE  --  0.59* 0.37* 0.54* 0.54* 0.61  CALCIUM  --  8.4* 8.3* 8.6* 8.4* 8.5*  MG 2.3  --   --   --   --   --    Liver Function Tests: Recent Labs  Lab 12/15/20 1155  AST 21  ALT 26  ALKPHOS 1,159*  BILITOT 0.7  PROT 6.9  ALBUMIN 2.7*   No results for input(s): LIPASE, AMYLASE in the last 168 hours. No results for input(s): AMMONIA in the last 168 hours. CBC: Recent Labs  Lab 12/15/20 1155 12/15/20 1202 12/16/20 0348 12/17/20 0806 12/18/20 0310 12/19/20 0133 12/20/20 0450  WBC 11.6*  --  14.7* 11.3* 10.1 8.0 6.9  NEUTROABS 10.1*  --   --   --   --   --   --   HGB 10.4*   < > 9.8* 10.0* 9.8* 10.4* 10.8*  HCT 32.7*   < > 30.4* 31.6* 30.5* 31.8* 32.8*  MCV 89.8  --  89.1 90.5 90.2 88.8 88.6  PLT 374  --  342 351 382 393 394   < > = values in this interval not displayed.   Cardiac Enzymes: No results for input(s): CKTOTAL, CKMB, CKMBINDEX, TROPONINI in the last 168 hours. BNP: Invalid input(s): POCBNP CBG: Recent Labs  Lab 12/21/20 0608 12/21/20 1121 12/21/20 1528 12/22/20 0007 12/22/20 0533  GLUCAP 192* 328* 288* 206* 148*   D-Dimer No results for input(s): DDIMER in the last 72 hours. Hgb A1c No results for input(s): HGBA1C in the last 72 hours. Lipid Profile No results for input(s): CHOL,  HDL, LDLCALC, TRIG, CHOLHDL, LDLDIRECT in the last 72 hours. Thyroid function studies No results for input(s): TSH, T4TOTAL, T3FREE, THYROIDAB in the last 72 hours.  Invalid input(s): FREET3 Anemia work up No results for input(s):  VITAMINB12, FOLATE, FERRITIN, TIBC, IRON, RETICCTPCT in the last 72 hours. Urinalysis    Component Value Date/Time   COLORURINE YELLOW 12/16/2020 1546   APPEARANCEUR CLEAR 12/16/2020 1546   LABSPEC 1.032 (H) 12/16/2020 1546   PHURINE 6.0 12/16/2020 1546   GLUCOSEU >=500 (A) 12/16/2020 1546   HGBUR NEGATIVE 12/16/2020 1546   BILIRUBINUR NEGATIVE 12/16/2020 1546   BILIRUBINUR 1+ 02/23/2016 1256   [place] 12/16/2020 1546   PROTEINUR NEGATIVE 12/16/2020 1546   UROBILINOGEN 1.0 02/23/2016 1256   NITRITE NEGATIVE 12/16/2020 1546   LEUKOCYTESUR TRACE (A) 12/16/2020 1546   Sepsis Labs Invalid input(s): PROCALCITONIN,  WBC,  LACTICIDVEN Microbiology Recent Results (from the past 240 hour(s))  Resp Panel by RT-PCR (Flu A&B, Covid) Nasopharyngeal Swab     Status: None   Collection Time: 12/15/20 11:52 AM   Specimen: Nasopharyngeal Swab; Nasopharyngeal(NP) swabs in vial transport medium  Result Value Ref Range Status   SARS Coronavirus 2 by RT PCR NEGATIVE NEGATIVE Final    Comment: (NOTE) SARS-CoV-2 target nucleic acids are NOT DETECTED.  The SARS-CoV-2 RNA is generally detectable in upper respiratory specimens during the acute phase of infection. The lowest concentration of SARS-CoV-2 viral copies this assay can detect is 138 copies/mL. A negative result does not preclude SARS-Cov-2 infection and should not be used as the sole basis for treatment or other patient management decisions. A negative result may occur with  improper specimen collection/handling, submission of specimen other than nasopharyngeal swab, presence of viral mutation(s) within the areas targeted by this assay, and inadequate number of viral copies(<138 copies/mL). A negative result must be combined with clinical observations, patient history, and epidemiological information. The expected result is Negative.  Fact Sheet for Patients:  EntrepreneurPulse.com.au  Fact Sheet for  Healthcare Providers:  IncredibleEmployment.be  This test is no t yet approved or cleared by the [place] FDA and  has been authorized for detection and/or diagnosis of SARS-CoV-2 by FDA under an Emergency Use Authorization (EUA). This EUA will remain  in effect (meaning this test can be used) for the duration of the COVID-19 declaration under Section 564(b)(1) of the Act, 21 U.S.C.section 360bbb-3(b)(1), unless the authorization is terminated  or revoked sooner.       Influenza A by PCR NEGATIVE NEGATIVE Final   Influenza B by PCR NEGATIVE NEGATIVE Final    Comment: (NOTE) The Xpert Xpress SARS-CoV-2/FLU/RSV plus assay is intended as an aid in the diagnosis of influenza from Nasopharyngeal swab specimens and should not be used as a sole basis for treatment. Nasal washings and aspirates are unacceptable for Xpert Xpress SARS-CoV-2/FLU/RSV testing.  Fact Sheet for Patients: EntrepreneurPulse.com.au  Fact Sheet for Healthcare Providers: IncredibleEmployment.be  This test is not yet approved or cleared by the [place] FDA and has been authorized for detection and/or diagnosis of SARS-CoV-2 by FDA under an Emergency Use Authorization (EUA). This EUA will remain in effect (meaning this test can be used) for the duration of the COVID-19 declaration under Section 564(b)(1) of the Act, 21 U.S.C. section 360bbb-3(b)(1), unless the authorization is terminated or revoked.  Performed at [facility], [place] [address], [place], [place] [address]   Culture, blood (routine x 2)     Status: None   Collection Time: 12/15/20 12:00 PM  Specimen: BLOOD LEFT HAND  Result Value Ref Range Status   Specimen Description BLOOD LEFT HAND  Final   Special Requests   Final    BOTTLES DRAWN AEROBIC AND ANAEROBIC Blood Culture results may not be optimal due to an inadequate volume of blood received in culture bottles   Culture   Final     NO GROWTH 5 DAYS Performed at [facility], [place] [address] [place], [place] [address]    Report Status 12/20/2020 FINAL  Final  Culture, blood (routine x 2)     Status: None   Collection Time: 12/15/20 12:06 PM   Specimen: BLOOD RIGHT HAND  Result Value Ref Range Status   Specimen Description BLOOD RIGHT HAND  Final   Special Requests   Final    BOTTLES DRAWN AEROBIC AND ANAEROBIC Blood Culture results may not be optimal due to an inadequate volume of blood received in culture bottles   Culture   Final    NO GROWTH 5 DAYS Performed at [facility], [place] [address], [place], [place] [address]    Report Status 12/20/2020 FINAL  Final  Culture, Urine     Status: Abnormal   Collection Time: 12/16/20  2:24 PM   Specimen: Urine, Random  Result Value Ref Range Status   Specimen Description URINE, RANDOM  Final   Special Requests   Final    NONE Performed at [facility], [place] [address] [place], [place] [address]    Culture MULTIPLE SPECIES PRESENT, SUGGEST RECOLLECTION (A)  Final   Report Status 12/17/2020 FINAL  Final  SARS CORONAVIRUS 2 (TAT 6-24 HRS) Nasopharyngeal Nasopharyngeal Swab     Status: None   Collection Time: 12/20/20 11:53 AM   Specimen: Nasopharyngeal Swab  Result Value Ref Range Status   SARS Coronavirus 2 NEGATIVE NEGATIVE Final    Comment: (NOTE) SARS-CoV-2 target nucleic acids are NOT DETECTED.  The SARS-CoV-2 RNA is generally detectable in upper and lower respiratory specimens during the acute phase of infection. Negative results do not preclude SARS-CoV-2 infection, do not rule out co-infections with other pathogens, and should not be used as the sole basis for treatment or other patient management decisions. Negative results must be combined with clinical observations, patient history, and epidemiological information. The expected result is Negative.  Fact Sheet for Patients: SugarRoll.be  Fact Sheet  for Healthcare Providers: https://www.woods-mathews.com/  This test is not yet approved or cleared by the [place] FDA and  has been authorized for detection and/or diagnosis of SARS-CoV-2 by FDA under an Emergency Use Authorization (EUA). This EUA will remain  in effect (meaning this test can be used) for the duration of the COVID-19 declaration under Se ction 564(b)(1) of the Act, 21 U.S.C. section 360bbb-3(b)(1), unless the authorization is terminated or revoked sooner.  Performed at [facility], [place] [address] [place],  [address]      Patient was seen and examined on the day of discharge and was found to be in stable condition. Time coordinating discharge: 35 minutes including assessment and coordination of care, as well as examination of the patient.   SIGNED:  Nita Sells, DO [facility] 12/22/2020, 10:57 AM

## 2020-12-23 ENCOUNTER — Telehealth: Payer: Self-pay | Admitting: *Deleted

## 2020-12-23 ENCOUNTER — Telehealth: Payer: Self-pay

## 2020-12-23 NOTE — Telephone Encounter (Signed)
Transition Care Management Unsuccessful Follow-up Telephone Call  Date of discharge and from where:  [facility] 12/[facility]  Attempts:  1st Attempt  Reason for unsuccessful TCM follow-up call:  No answer/busy

## 2020-12-24 ENCOUNTER — Ambulatory Visit: Payer: Medicare Other | Admitting: Radiation Oncology

## 2020-12-24 DIAGNOSIS — C259 Malignant neoplasm of pancreas, unspecified: Secondary | ICD-10-CM | POA: Diagnosis not present

## 2020-12-24 DIAGNOSIS — R627 Adult failure to thrive: Secondary | ICD-10-CM | POA: Diagnosis not present

## 2020-12-24 DIAGNOSIS — R5383 Other fatigue: Secondary | ICD-10-CM | POA: Diagnosis not present

## 2020-12-24 DIAGNOSIS — E11649 Type 2 diabetes mellitus with hypoglycemia without coma: Secondary | ICD-10-CM | POA: Diagnosis not present

## 2020-12-24 DIAGNOSIS — M6281 Muscle weakness (generalized): Secondary | ICD-10-CM | POA: Diagnosis not present

## 2020-12-24 NOTE — Chronic Care Management (AMB) (Signed)
  Chronic Care Management   Outreach Note  12/24/2020 Name: Colton Bush MRN: [id number] DOB: 1956/01/31  Colton Bush is a 65 y.o. year old male who is a primary care patient of [name], MD. I reached out to [name] by phone today in response to a referral sent by [name]'s PCP, [name], MD.     A second unsuccessful telephone outreach was attempted today. The patient was referred to the case management team for assistance with care management and care coordination.   Follow Up Plan: We have been unable to make contact with the patient. The care management team is available to follow up with the patient after provider conversation with the patient regarding recommendation for care management engagement and subsequent re-referral to the care management team. Patient is currently admitted into [facility].   [facility]

## 2020-12-25 ENCOUNTER — Other Ambulatory Visit: Payer: Self-pay | Admitting: Family Medicine

## 2020-12-28 ENCOUNTER — Ambulatory Visit: Payer: Medicare Other

## 2020-12-28 ENCOUNTER — Emergency Department (HOSPITAL_COMMUNITY)
Admission: EM | Admit: 2020-12-28 | Discharge: 2020-12-29 | Disposition: A | Discharge status: Home / Self Care | Payer: Medicare Other | Source: Home / Self Care | Attending: Emergency Medicine | Admitting: Emergency Medicine

## 2020-12-28 ENCOUNTER — Encounter (HOSPITAL_COMMUNITY): Payer: Self-pay | Admitting: Emergency Medicine

## 2020-12-28 DIAGNOSIS — Z794 Long term (current) use of insulin: Secondary | ICD-10-CM | POA: Insufficient documentation

## 2020-12-28 DIAGNOSIS — R197 Diarrhea, unspecified: Secondary | ICD-10-CM | POA: Diagnosis not present

## 2020-12-28 DIAGNOSIS — R5383 Other fatigue: Secondary | ICD-10-CM | POA: Diagnosis not present

## 2020-12-28 DIAGNOSIS — J869 Pyothorax without fistula: Secondary | ICD-10-CM | POA: Diagnosis not present

## 2020-12-28 DIAGNOSIS — K7689 Other specified diseases of liver: Secondary | ICD-10-CM | POA: Diagnosis not present

## 2020-12-28 DIAGNOSIS — D72829 Elevated white blood cell count, unspecified: Secondary | ICD-10-CM | POA: Diagnosis not present

## 2020-12-28 DIAGNOSIS — J90 Pleural effusion, not elsewhere classified: Secondary | ICD-10-CM | POA: Diagnosis not present

## 2020-12-28 DIAGNOSIS — I5032 Chronic diastolic (congestive) heart failure: Secondary | ICD-10-CM | POA: Insufficient documentation

## 2020-12-28 DIAGNOSIS — Z8507 Personal history of malignant neoplasm of pancreas: Secondary | ICD-10-CM | POA: Insufficient documentation

## 2020-12-28 DIAGNOSIS — Z20822 Contact with and (suspected) exposure to covid-19: Secondary | ICD-10-CM | POA: Insufficient documentation

## 2020-12-28 DIAGNOSIS — R42 Dizziness and giddiness: Secondary | ICD-10-CM | POA: Insufficient documentation

## 2020-12-28 DIAGNOSIS — I110 Hypertensive heart disease with heart failure: Secondary | ICD-10-CM | POA: Insufficient documentation

## 2020-12-28 DIAGNOSIS — R531 Weakness: Secondary | ICD-10-CM | POA: Insufficient documentation

## 2020-12-28 DIAGNOSIS — K750 Abscess of liver: Secondary | ICD-10-CM | POA: Diagnosis not present

## 2020-12-28 DIAGNOSIS — E1110 Type 2 diabetes mellitus with ketoacidosis without coma: Secondary | ICD-10-CM | POA: Insufficient documentation

## 2020-12-28 DIAGNOSIS — M6281 Muscle weakness (generalized): Secondary | ICD-10-CM | POA: Diagnosis not present

## 2020-12-28 DIAGNOSIS — R410 Disorientation, unspecified: Secondary | ICD-10-CM | POA: Diagnosis not present

## 2020-12-28 DIAGNOSIS — M47814 Spondylosis without myelopathy or radiculopathy, thoracic region: Secondary | ICD-10-CM | POA: Diagnosis not present

## 2020-12-28 DIAGNOSIS — Z66 Do not resuscitate: Secondary | ICD-10-CM | POA: Diagnosis not present

## 2020-12-28 DIAGNOSIS — D849 Immunodeficiency, unspecified: Secondary | ICD-10-CM | POA: Diagnosis not present

## 2020-12-28 DIAGNOSIS — R7989 Other specified abnormal findings of blood chemistry: Secondary | ICD-10-CM | POA: Diagnosis not present

## 2020-12-28 DIAGNOSIS — I2510 Atherosclerotic heart disease of native coronary artery without angina pectoris: Secondary | ICD-10-CM | POA: Diagnosis not present

## 2020-12-28 DIAGNOSIS — E876 Hypokalemia: Secondary | ICD-10-CM | POA: Diagnosis not present

## 2020-12-28 DIAGNOSIS — Z515 Encounter for palliative care: Secondary | ICD-10-CM | POA: Diagnosis not present

## 2020-12-28 LAB — CBC
HCT: 34.8 % — ABNORMAL LOW (ref 39.0–52.0)
Hemoglobin: 11.5 g/dL — ABNORMAL LOW (ref 13.0–17.0)
MCH: 29.9 pg (ref 26.0–34.0)
MCHC: 33 g/dL (ref 30.0–36.0)
MCV: 90.4 fL (ref 80.0–100.0)
Platelets: 286 10*3/uL (ref 150–400)
RBC: 3.85 MIL/uL — ABNORMAL LOW (ref 4.22–5.81)
RDW: 19.4 % — ABNORMAL HIGH (ref 11.5–15.5)
WBC: 7 10*3/uL (ref 4.0–10.5)
nRBC: 0 % (ref 0.0–0.2)

## 2020-12-28 LAB — COMPREHENSIVE METABOLIC PANEL
ALT: 145 U/L — ABNORMAL HIGH (ref 0–44)
AST: 87 U/L — ABNORMAL HIGH (ref 15–41)
Albumin: 2.8 g/dL — ABNORMAL LOW (ref 3.5–5.0)
Alkaline Phosphatase: 2516 U/L — ABNORMAL HIGH (ref 38–126)
Anion gap: 8 (ref 5–15)
BUN: 11 mg/dL (ref 8–23)
CO2: 25 mmol/L (ref 22–32)
Calcium: 8.7 mg/dL — ABNORMAL LOW (ref 8.9–10.3)
Chloride: 101 mmol/L (ref 98–111)
Creatinine, Ser: 0.57 mg/dL — ABNORMAL LOW (ref 0.61–1.24)
GFR, Estimated: >60 mL/min (ref >60)
Glucose, Bld: 259 mg/dL — ABNORMAL HIGH (ref 70–99)
Potassium: 4.4 mmol/L (ref 3.5–5.1)
Sodium: 134 mmol/L — ABNORMAL LOW (ref 135–145)
Total Bilirubin: 1.4 mg/dL — ABNORMAL HIGH (ref 0.3–1.2)
Total Protein: 6.7 g/dL (ref 6.5–8.1)

## 2020-12-28 LAB — C DIFFICILE QUICK SCREEN W PCR REFLEX
C Diff antigen: NEGATIVE
C Diff interpretation: NOT DETECTED
C Diff toxin: NEGATIVE

## 2020-12-28 LAB — URINALYSIS, ROUTINE W REFLEX MICROSCOPIC
Bilirubin Urine: NEGATIVE
Glucose, UA: 150 mg/dL — AB
Ketones, ur: NEGATIVE mg/dL
Leukocytes,Ua: NEGATIVE
Nitrite: NEGATIVE
Protein, ur: NEGATIVE mg/dL
RBC / HPF: >50 RBC/hpf — ABNORMAL HIGH (ref 0–5)
Specific Gravity, Urine: 1.02 (ref 1.005–1.030)
pH: 5 (ref 5.0–8.0)

## 2020-12-28 LAB — LIPASE, BLOOD: Lipase: 15 U/L (ref 11–51)

## 2020-12-28 LAB — RESP PANEL BY RT-PCR (FLU A&B, COVID) ARPGX2
Influenza A by PCR: NEGATIVE
Influenza B by PCR: NEGATIVE
SARS Coronavirus 2 by RT PCR: NEGATIVE

## 2020-12-28 LAB — MAGNESIUM: Magnesium: 2 mg/dL (ref 1.7–2.4)

## 2020-12-28 MED ORDER — LOPERAMIDE HCL 2 MG PO CAPS: 2.0000 mg | ORAL_CAPSULE | Freq: Every day | ORAL | 0 refills | Status: DC | PRN

## 2020-12-28 MED ORDER — SODIUM CHLORIDE 0.9 % IV BOLUS
500.0000 mL | Freq: Once | INTRAVENOUS | Status: AC
  Administered 2020-12-28: 500 mL via INTRAVENOUS

## 2020-12-28 MED ORDER — SODIUM CHLORIDE 0.9 % IV BOLUS
1000.0000 mL | Freq: Once | INTRAVENOUS | Status: AC
  Administered 2020-12-28: 1000 mL via INTRAVENOUS

## 2020-12-28 NOTE — ED Notes (Signed)
Wife Dellis Voght [phone] cell/ [phone] home would like an update asap

## 2020-12-28 NOTE — ED Notes (Signed)
Pt able to ambulate with walker around room with minimal assist to stand and get out of bed.  States he feels a little dizzy immediatly after standing

## 2020-12-28 NOTE — ED Provider Notes (Signed)
[facility] Provider Note   CSN: [id number] Arrival date & time: 12/28/20  0600     History Chief Complaint  Patient presents with  . Diarrhea    Colton Bush is a 65 y.o. male.  Patient with complex medical history, recurrent pancreatic cancer, subdural hematoma, liver abscesses and empyema for which she is currently on linezolid, recently discontinued Rocephin and metronidazole, recent admission 5/18-5/25 for hypoglycemic spell and preceding problems, currently residing at [facility] care facility --presents to the emergency department for diarrhea.  This has been going on for about 5 days.  Patient reports 2-3 watery stools daily without blood.  He denies associated abdominal pain or vomiting.  No reported fevers.  He states severe urgency when he needs to have a bowel movement.  No shortness of breath or trouble breathing.  He does endorse lightheadedness with standing but no passing out spells.  Patient has as needed orders for MiraLAX and Imodium. The onset of this condition was acute. The course is constant. Aggravating factors: none. Alleviating factors: none.          Past Medical History:  Diagnosis Date  . Arthritis    left hand  . Bronchitis 1977  . Cancer ([place]) 03/09/2016   pancreatic cancer, sees Dr. Cristino Martes at [facility]   . Depression    takes Cymbalta daily  . Diabetes mellitus type II    sees Dr. Chalmers Cater   . GERD (gastroesophageal reflux disease)    takes Omeprazole daily  . H/O hiatal hernia   . Hyperlipidemia    takes Zocor daily  . Hypertension    takes Amlodipine daily  . Hypoglycemia 06/18/2017  . Neck pain    C4-7 stenosis and herniated disc  . Neuromuscular disorder ([place])    hiatal hernia  . Scoliosis    slight  . Spinal cord injury, C5-C7 ([place])    c4-c7  . Stiffness of hand joint    d/t cervical issues    Patient Active Problem List   Diagnosis Date Noted  . Anemia of chronic illness  12/09/2020  . Hypokalemia 12/09/2020  . Nausea & vomiting   . Pain   . Bladder outlet obstruction 11/12/2020  . Toxic encephalopathy 11/12/2020  . Atrophic pancreas 11/12/2020  . Empyema lung ([place])   . Subdural hematoma ([place])   . Pneumonia of both lower lobes due to infectious organism 11/06/2020  . Liver abscess 11/06/2020  . Sepsis ([place]) 11/05/2020  . Uncontrolled type 2 diabetes mellitus with ketoacidosis without coma, with long-term current use of insulin ([place]) 11/05/2020  . Increased anion gap metabolic acidosis 39/09/90  . Chronic diastolic CHF (congestive heart failure) ([place]) 11/05/2020  . Closed right hip fracture, initial encounter ([place]) 10/18/2020  . Fall from ground level 10/18/2020  . Hypoglycemia 06/19/2017  . Hypothermia 06/19/2017  . Goals of care, counseling/discussion 09/29/2016  . Port catheter in place 04/11/2016  . Hypercalcemia 03/29/2016  . Adenocarcinoma of head of pancreas ([place]) 03/17/2016  . Biliary obstruction   . Obstructive jaundice due to malignant neoplasm ([place]) 02/27/2016  . DKA (diabetic ketoacidosis) ([place]) 02/27/2016  . Mixed diabetic hyperlipidemia associated with type 2 diabetes mellitus ([place]) 05/05/2014  . Cervical spondylosis with myelopathy 08/18/2011  . CERUMEN IMPACTION 12/14/2008  . Diabetes ([place]) 09/16/2007  . Hyperlipidemia, mixed 09/16/2007  . Essential hypertension 09/16/2007  . GERD without esophagitis 09/16/2007  . ESOPHAGEAL STRICTURE 04/04/2007  . HIATAL HERNIA 04/04/2007    Past Surgical History:  Procedure Laterality Date  . ANTERIOR CERVICAL DECOMP/DISCECTOMY FUSION  08/18/2011   Procedure: ANTERIOR CERVICAL DECOMPRESSION/DISCECTOMY FUSION 3 LEVELS;  Surgeon: Winfield Cunas, MD;  Location: [facility];  Service: Neurosurgery;  Laterality: N/A;  Anterior Cervical Four-Five/Five-Six/Six-Seven Decompression with Fusion, Plating, and Bonegraft  . BURR HOLE Left 11/09/2020   Procedure: LEFT BURR HOLES FOR EVACUATION OF Subdural  Hematoma;  Surgeon: Judith Part, MD;  Location: [facility];  Service: Neurosurgery;  Laterality: Left;  . CARPAL TUNNEL RELEASE  2013   bilateral, per Dr. Christella Noa   . COLONOSCOPY  10-30-14   per Dr. Olevia Perches, clear, repeat in 10 yrs   . egd with esophageal dilation  9-08   per Dr. Olevia Perches  . ERCP N/A 03/01/2016   Procedure: ENDOSCOPIC RETROGRADE CHOLANGIOPANCREATOGRAPHY (ERCP) with brushings and stent;  Surgeon: Doran Stabler, MD;  Location: [facility];  Service: Endoscopy;  Laterality: N/A;  . ESOPHAGOGASTRODUODENOSCOPY (EGD) WITH PROPOFOL N/A 05/27/2020   Procedure: ESOPHAGOGASTRODUODENOSCOPY (EGD) WITH PROPOFOL;  Surgeon: Milus Banister, MD;  [facility]: [facility];  Service: Endoscopy;  Laterality: N/A;  . EUS N/A 03/09/2016   Procedure: ESOPHAGEAL ENDOSCOPIC ULTRASOUND (EUS) RADIAL;  Surgeon: Milus Banister, MD;  [facility];  Service: Endoscopy;  Laterality: N/A;  . EUS N/A 05/27/2020   Procedure: UPPER ENDOSCOPIC ULTRASOUND (EUS) RADIAL;  Surgeon: Milus Banister, MD;  [facility];  Service: Endoscopy;  Laterality: N/A;  . FEMUR IM NAIL Right 10/25/2020   Procedure: INTRAMEDULLARY (IM) NAIL FEMORAL;  Surgeon: Rod Can, MD;  Location: [facility];  Service: Orthopedics;  Laterality: Right;  . IR THORACENTESIS ASP PLEURAL SPACE W/IMG GUIDE  11/19/2020  . lymph nodes biopsy    . melanoma rt calf  1999  . PORT-A-CATH REMOVAL N/A 04/03/2019   Procedure: PORT REMOVAL;  Surgeon: Stark Klein, MD;  Location: [facility];  Service: General;  Laterality: N/A;  . PORTACATH PLACEMENT Left 03/22/2016   Procedure: INSERTION PORT-A-CATH;  Surgeon: Stark Klein, MD;  Location: [facility];  Service: General;  Laterality: Left;  . SPINE SURGERY    . TONSILLECTOMY     as a child  . ULNAR TUNNEL RELEASE  2013   right arm, per Dr. Christella Noa   . UPPER GASTROINTESTINAL ENDOSCOPY    . WHIPPLE PROCEDURE N/A 09/19/2016   Procedure: DIAGNOSTIC LAPAROSCOPY, LAPAROSCOPIC LIVER BIOPSY,  RETROPERITONEAL EXPLORATION, INTRAOPERATIVE ULTRASOUND;  Surgeon: Stark Klein, MD;  Location: [facility];  Service: General;  Laterality: N/A;       Family History  Problem Relation Age of Onset  . Heart disease Father   . Heart disease Brother 17  . Anesthesia problems Mother   . Heart disease Mother   . Dementia Mother   . Diabetes Sister   . Stroke Sister   . Colon cancer Neg Hx   . Rectal cancer Neg Hx   . Stomach cancer Neg Hx     Social History   Tobacco Use  . Smoking status: Never Smoker  . Smokeless tobacco: Never Used  . Tobacco comment: tried a pipe 35 years ago   Vaping Use  . Vaping Use: Never used  Substance Use Topics  . Alcohol use: No    Alcohol/week: 0.0 standard drinks  . Drug use: No    Home Medications Prior to Admission medications   Medication Sig Start Date End Date Taking? Authorizing Provider  acetaminophen (TYLENOL) 325 MG tablet Take 1-2 tablets (325-650 mg total) by mouth every 4 (four) hours as needed  for mild pain. 12/08/20   Angiulli, Lavon Paganini, PA-C  ascorbic acid (VITAMIN C) 500 MG tablet Take 1 tablet (500 mg total) by mouth daily. 12/08/20   Angiulli, Lavon Paganini, PA-C  citalopram (CELEXA) 40 MG tablet Take 1 tablet (40 mg total) by mouth daily. 12/21/20   Dessa Phi, DO  diclofenac Sodium (VOLTAREN) 1 % GEL Apply 2 g topically 4 (four) times daily. To right knee 12/08/20   Angiulli, Lavon Paganini, PA-C  hydrocerin (EUCERIN) CREA Apply 1 application topically 2 (two) times daily. To legs and back 12/08/20   Angiulli, Lavon Paganini, PA-C  HYDROcodone-acetaminophen (NORCO/VICODIN) 5-325 MG tablet Take 1 tablet by mouth every 3 (three) hours as needed for severe pain. 12/21/20   Dessa Phi, DO  hydrocortisone cream 1 % Apply topically 2 (two) times daily. To back/areas of itching 12/08/20   Angiulli, Lavon Paganini, PA-C  insulin aspart (NOVOLOG FLEXPEN) 100 UNIT/ML FlexPen Inject 2-13 Units into the skin 3 (three) times daily with meals. Sliding Scale insulin for  blood sugars from 101-150 - 2 units. For blood sugars from 151-200 - 3 units. For blood sugars from 201-250 - 5 units. For blood sugars from 301-350 use 7 units. Call MD if blood sugars are over 300. 12/08/20   Vandevander, Ivan Anchors, PA-C  insulin detemir (LEVEMIR) 100 UNIT/ML FlexPen Inject 14 Units into the skin daily. 12/21/20   Dessa Phi, DO  Insulin Pen Needle (PEN NEEDLES) 32G X 6 MM MISC 1 application by Does not apply route as needed. 12/08/20   Rape, Ivan Anchors, PA-C  levETIRAcetam (KEPPRA) 1000 MG tablet Take 1 tablet (1,000 mg total) by mouth 2 (two) times daily. 12/08/20   Angiulli, Lavon Paganini, PA-C  lidocaine (LIDODERM) 5 % Place 1 patch onto the skin daily. Apply to lower back at 8 pm and remove at 8 am daily 12/08/20   Angiulli, Lavon Paganini, PA-C  linezolid (ZYVOX) 600 MG tablet Take 1 tablet (600 mg total) by mouth every 12 (twelve) hours for 22 days. 12/21/20 01/15/2021  Dessa Phi, DO  lipase/protease/amylase [phone] units CPEP Take 1 capsule (24,000 Units total) by mouth 3 (three) times daily before meals. 12/08/20   Angiulli, Lavon Paganini, PA-C  loperamide (IMODIUM) 2 MG capsule Take 2 mg by mouth daily as needed for diarrhea or loose stools.    [provider]  loratadine (CLARITIN) 10 MG tablet Take 1 tablet (10 mg total) by mouth daily. 12/08/20   Angiulli, Lavon Paganini, PA-C  midodrine (PROAMATINE) 5 MG tablet Take 1 tablet (5 mg total) by mouth 3 (three) times daily with meals. 12/08/20   Angiulli, Lavon Paganini, PA-C  Multiple Vitamin (MULTIVITAMIN WITH MINERALS) TABS tablet Take 1 tablet by mouth daily.    [provider]  omeprazole (PRILOSEC) 20 MG capsule TAKE 1 CAPSULE DAILY Patient taking differently: Take 20 mg by mouth daily. 11/17/20   [name], MD  ondansetron (ZOFRAN-ODT) 4 MG disintegrating tablet Take 1 tablet (4 mg total) by mouth every 6 (six) hours as needed for nausea or vomiting (can alternate with compazine). 12/08/20   Angiulli, Lavon Paganini, PA-C  polycarbophil  (FIBERCON) 625 MG tablet Take 1 tablet (625 mg total) by mouth 2 (two) times daily. 12/08/20   Angiulli, Lavon Paganini, PA-C  polyethylene glycol (MIRALAX / GLYCOLAX) 17 g packet Take 17 g by mouth daily as needed for mild constipation. 11/12/20   Debbe Odea, MD  simethicone (MYLICON) 80 MG chewable tablet Chew 1 tablet (80 mg total)  by mouth 4 (four) times daily -  with meals and at bedtime. 12/08/20   Angiulli, Lavon Paganini, PA-C  tamsulosin (FLOMAX) 0.4 MG CAPS capsule Take 2 capsules (0.8 mg total) by mouth daily. 12/08/20   Angiulli, Lavon Paganini, PA-C  zinc sulfate 220 (50 Zn) MG capsule Take 1 capsule (220 mg total) by mouth daily. 12/08/20   Angiulli, Lavon Paganini, PA-C    Allergies    Patient has no known allergies.  Review of Systems   Review of Systems  Constitutional: Negative for fever.  HENT: Negative for rhinorrhea and sore throat.   Eyes: Negative for redness.  Respiratory: Negative for cough.   Cardiovascular: Negative for chest pain.  Gastrointestinal: Positive for diarrhea. Negative for abdominal pain, nausea and vomiting.  Genitourinary: Negative for dysuria and hematuria.  Musculoskeletal: Negative for myalgias.  Skin: Negative for rash.  Neurological: Positive for weakness (generalized) and light-headedness. Negative for headaches.    Physical Exam Updated Vital Signs BP 131/69 (BP Location: Right Arm)   Pulse 70   Temp 97.7 F (36.5 C) (Oral)   Resp 13   SpO2 97%   Physical Exam Vitals and nursing note reviewed.  Constitutional:      Appearance: He is well-developed. He is ill-appearing.  HENT:     Head: Normocephalic and atraumatic.     Mouth/Throat:     Mouth: Mucous membranes are dry.     Comments: Dry mucous membranes Eyes:     General:        Right eye: No discharge.        Left eye: No discharge.     Conjunctiva/sclera: Conjunctivae normal.  Cardiovascular:     Rate and Rhythm: Normal rate and regular rhythm.     Heart sounds: Normal heart sounds.   Pulmonary:     Effort: Pulmonary effort is normal.     Breath sounds: Normal breath sounds.  Abdominal:     Palpations: Abdomen is soft.     Tenderness: There is no abdominal tenderness. There is no guarding or rebound.     Comments: Scaphoid abdomen  Musculoskeletal:     Cervical back: Normal range of motion and neck supple.  Skin:    General: Skin is warm and dry.  Neurological:     Mental Status: He is alert.     ED Results / Procedures / Treatments   Labs (all labs ordered are listed, but only abnormal results are displayed) Labs Reviewed  COMPREHENSIVE METABOLIC PANEL - Abnormal; Notable for the following components:      Result Value   Sodium 134 (*)    Glucose, Bld 259 (*)    Creatinine, Ser 0.57 (*)    Calcium 8.7 (*)    Albumin 2.8 (*)    AST 87 (*)    ALT 145 (*)    Alkaline Phosphatase 2,516 (*)    Total Bilirubin 1.4 (*)    All other components within normal limits  CBC - Abnormal; Notable for the following components:   RBC 3.85 (*)    Hemoglobin 11.5 (*)    HCT 34.8 (*)    RDW 19.4 (*)    All other components within normal limits  URINALYSIS, ROUTINE W REFLEX MICROSCOPIC - Abnormal; Notable for the following components:   Color, Urine AMBER (*)    APPearance HAZY (*)    Glucose, UA 150 (*)    Hgb urine dipstick SMALL (*)    RBC / HPF >50 (*)    Bacteria,  UA RARE (*)    All other components within normal limits  RESP PANEL BY RT-PCR (FLU A&B, COVID) [id number]  GASTROINTESTINAL PANEL BY PCR, STOOL (REPLACES STOOL CULTURE)  C DIFFICILE QUICK SCREEN W PCR REFLEX  LIPASE, BLOOD  MAGNESIUM    ED ECG REPORT   Date: 12/28/2020  Rate: 68  Rhythm: normal sinus rhythm  QRS Axis: normal  Intervals: QT prolonged  ST/T Wave abnormalities: nonspecific T wave changes  Conduction Disutrbances:none  Narrative Interpretation:   Old EKG Reviewed: changes noted, faster today from 12/17/20  I have personally reviewed the EKG tracing.  Radiology No results  found.  Procedures Procedures   Medications Ordered in ED Medications  sodium chloride 0.9 % bolus 1,000 mL (0 mLs Intravenous Stopped 12/28/20 0830)  sodium chloride 0.9 % bolus 500 mL (0 mLs Intravenous Stopped 12/28/20 1215)    ED Course  I have reviewed the triage vital signs and the nursing notes.  Pertinent labs & imaging results that were available during my care of the patient were reviewed by me and considered in my medical decision making (see chart for details).  Patient seen and examined. Work-up initiated. Fluids ordered. Will obtain stool studies if possible.   Vital signs reviewed and are as follows: BP 131/69 (BP Location: Right Arm)   Pulse 70   Temp 97.7 F (36.5 C) (Oral)   Resp 13   SpO2 97%   10:34 AM patient rechecked.  He is resting in the room.  Ordered an additional 500 cc of fluid.  Awaiting magnesium, RN called lab about this.  Alk phos (13 days go 1159 >> today 2516)  10:53 AM I discussed results so far with patient's wife by telephone.  She is concerned about his current living situation at his rehab facility.  Specifically, they have not been able to get him up to the restroom and he has had stool in bed over the past few days.  She has concerns about him going back to the facility is currently in.  She asked if he could have been into the hospital for a couple of days for them to get stronger.  We discussed that we are currently in the process of looking for indications for admission.  If he does not have any, he would have to go back to his current facility.  Continuing to wait magnesium.  Awaiting UA.  Patient ambulates just a few steps with his walker.  We will try to better evaluate his functional status here.  1:49 PM I was able to watch the patient get up at bedside using a walker.  He was able to ambulate around the foot at the bed to the other side with minimal assistance.  He did not appear to be particularly unsteady.  Urine obtained, culture  sent, awaiting UA.  3:37 PM work-up overall reassuring.  Patient's wife arrived and he had a discussion about care after ED visit today.  Reiterated that at this point, no indications for admission today.  His options are to go back to his current rehab facility or to go home.  He and his wife both refuse to go back to the facility.  Discussed that he is unable to be placed at a different facility from the emergency department today.  He and his wife understand.  I spoke with case manager who will assist in setting patient up for home health services.  Face-to-face entered.  [place] PA-C aware of patient at shift change.  MDM Rules/Calculators/A&P                          Patient with multiple medical comorbidities as discussed above presents from rehab facility with diarrhea.  Work-up today is overall stable and reassuring.  Electrolytes look good with normal creatinine.  Liver function tests are abnormal, but have been due to previous medical issues including liver abscesses.  Patient is not febrile.  He is tolerating orals.  He is here for diarrhea, but has not had a bowel movement while in the emergency department.  He has ambulated with a walker with minimal assistance.  Patient and family refused to go back to rehab facility today.  Case manager consulted to help set up home health services.  Encouraged very close PCP follow-up to help manage his chronic conditions.   Final Clinical Impression(s) / ED Diagnoses Final diagnoses:  Generalized weakness  Diarrhea, unspecified type    Rx / DC Orders ED Discharge Orders         [place]        12/28/20 1537    Face-to-face encounter (required for Medicare/Medicaid patients)       Comments: I Carlisle Cater certify that this patient is under my care and that I, or a nurse practitioner or physician's assistant working with me, had a face-to-face encounter that meets the physician face-to-face encounter requirements with  this patient on 12/28/2020. The encounter with the patient was in whole, or in part for the following medical condition(s) which is the primary reason for home health care (List medical condition): empyema, liver abscess, hip pain with previous hip replacement   12/28/20 1537    loperamide (IMODIUM) 2 MG capsule  Daily PRN        12/28/20 1540           Carlisle Cater, PA-C 12/28/20 1544    Sherwood Gambler, MD 01/01/21 [phone]

## 2020-12-28 NOTE — ED Triage Notes (Signed)
BIB [facility] after wife called to report that pt had loose stools x 5 days. Per [facility] facility where pt resides.they have been given both Miralax and Immodium.    Hx recent hip replacement, pancreatic cancer, DM

## 2020-12-28 NOTE — ED Provider Notes (Signed)
Patient is a 65 year old male whose care was transferred to me by Burt Ek.  His HPI is below:  Patient with complex medical history, recurrent pancreatic cancer, subdural hematoma, liver abscesses and empyema for which she is currently on linezolid, recently discontinued Rocephin and metronidazole, recent admission 5/18-5/25 for hypoglycemic spell and preceding problems, currently residing at [facility] care facility --presents to the emergency department for diarrhea.  This has been going on for about 5 days.  Patient reports 2-3 watery stools daily without blood.  He denies associated abdominal pain or vomiting.  No reported fevers.  He states severe urgency when he needs to have a bowel movement.  No shortness of breath or trouble breathing.  He does endorse lightheadedness with standing but no passing out spells.  Patient has as needed orders for MiraLAX and Imodium. The onset of this condition was acute. The course is constant. Aggravating factors: none. Alleviating factors: none.  Physical Exam  BP (!) 186/78   Pulse 74   Temp 97.7 F (36.5 C) (Oral)   Resp 20   SpO2 100%   Physical Exam Vitals and nursing note reviewed.  Constitutional:      Appearance: He is well-developed. He is ill-appearing.  HENT:     Head: Normocephalic and atraumatic.     Mouth/Throat:     Mouth: Mucous membranes are dry.     Comments: Dry mucous membranes Eyes:     General:        Right eye: No discharge.        Left eye: No discharge.     Conjunctiva/sclera: Conjunctivae normal.  Cardiovascular:     Rate and Rhythm: Normal rate and regular rhythm.     Heart sounds: Normal heart sounds.  Pulmonary:     Effort: Pulmonary effort is normal.     Breath sounds: Normal breath sounds.  Abdominal:     Palpations: Abdomen is soft.     Tenderness: There is no abdominal tenderness. There is no guarding or rebound.     Comments: Scaphoid abdomen  Musculoskeletal:     Cervical back: Normal range of  motion and neck supple.  Skin:    General: Skin is warm and dry.  Neurological:     Mental Status: He is alert.  ED Course/Procedures     Procedures  MDM   Patient is a 65 year old male whose care was transferred me at shift change from [facility].  Please refer to his note below for further information regarding the patient.  In summary, patient has a complicated medical history including recurrent pancreatic cancer, subdural hematoma, liver abscesses, and empyema.  Patient recently admitted for hypoglycemic spells earlier in the month and discharged to [facility] care facility.  He presents today due to diarrhea.  Lab work today is overall reassuring.  He has ambulated and tolerated p.o. without difficulty.  C. difficile screen is negative.  Gastrointestinal panel by PCR is pending.  Patient and his wife are concerned regarding the care he was receiving at his nursing facility.  They requested home health and to be discharged home.  This was obtained for them and upon discharge they decided that home health was not reasonable.  I once again discussed patient with the social work team and they state that patient will need to either be discharged home or will need to go back to his facility.  They state that his wife will need to discuss with the social worker at their current facility to  determine a location that he would prefer.  This was discussed with the patient as well as his wife and they understood.  They now request that he be discharged back to his facility until they can find a new nursing facility for him to reside in.          Rayna Sexton, Hershal Coria 12/28/20 2326    Davonna Belling, MD 12/28/20 2358

## 2020-12-28 NOTE — ED Notes (Signed)
Pt attempt to pee I will try again later

## 2020-12-28 NOTE — Discharge Instructions (Signed)
Please read and follow all provided instructions.  Your diagnoses today include:  1. Generalized weakness   2. Diarrhea, unspecified type    Tests performed today include: Blood cell counts (white, red, and platelets) Electrolytes  Kidney/liver function test Urine test to check for infection - some blood, no infection cells COVID/flu test - negative  Vital signs. See below for your results today.   Medications prescribed:   Loperamide - medication for diarrhea  Take any prescribed medications only as directed.  Home care instructions:  Follow any educational materials contained in this packet.  BE VERY CAREFUL not to take multiple medicines containing Tylenol (also called acetaminophen). Doing so can lead to an overdose which can damage your liver and cause liver failure and possibly death.   Follow-up instructions: Please follow-up with your primary care provider in the next 2 days for further evaluation of your symptoms.   Follow-up regarding home health services.  Return instructions:   Please return to the Emergency Department if you experience worsening symptoms.   Please return if you have any other emergent concerns.  Additional Information:  Your vital signs today were: BP (!) 186/78   Pulse 74   Temp 97.7 F (36.5 C) (Oral)   Resp 20   SpO2 100%  If your blood pressure (BP) was elevated above 135/85 this visit, please have this repeated by your doctor within one month. --------------

## 2020-12-28 NOTE — ED Notes (Signed)
Pt resting comfortably in bed with eyes closed, respirations are even and non-labored  Call light within reach

## 2020-12-28 NOTE — Progress Notes (Signed)
CSW received call from [place] with [facility] who states pt has been with then since the 25th for SNF stay. Pt can return to the facility and does not need [facility] services set up at this time as he is at the facility for SNF and not long term care. Pt is okay to return to the facility when medically cleared. TOC signing off.

## 2020-12-28 NOTE — ED Notes (Signed)
Pt had small loose bowel movement.

## 2020-12-28 NOTE — Discharge Planning (Signed)
Colton Bush J. Clydene Laming, RN, BSN, [place] [phone] RNCM spoke with pt at bedside regarding discharge planning for [facility]. Offered pt medicare.gov list of home health agencies to choose from.  Pt chose [facility] to render services. Tommi Rumps of [facility] notified. Patient made aware that [facility] will be in contact in 24-48 hours.  No DME needs identified at this time.

## 2020-12-28 NOTE — ED Notes (Signed)
Lynnwood Beckford wife [phone] would like an update on the patient and also has a few questions.

## 2020-12-28 NOTE — ED Notes (Signed)
Nyoka Cowden, PA-C 12/28/20 1406

## 2020-12-29 ENCOUNTER — Ambulatory Visit: Payer: Medicare Other

## 2020-12-29 LAB — GASTROINTESTINAL PANEL BY PCR, STOOL (REPLACES STOOL CULTURE)

## 2020-12-29 NOTE — ED Notes (Signed)
Pt left with ptar

## 2020-12-30 ENCOUNTER — Ambulatory Visit: Payer: Medicare Other

## 2020-12-30 DIAGNOSIS — R4182 Altered mental status, unspecified: Secondary | ICD-10-CM | POA: Diagnosis not present

## 2020-12-30 DIAGNOSIS — M6281 Muscle weakness (generalized): Secondary | ICD-10-CM | POA: Diagnosis not present

## 2020-12-30 DIAGNOSIS — E11649 Type 2 diabetes mellitus with hypoglycemia without coma: Secondary | ICD-10-CM | POA: Diagnosis not present

## 2020-12-30 DIAGNOSIS — R5383 Other fatigue: Secondary | ICD-10-CM | POA: Diagnosis not present

## 2020-12-30 DIAGNOSIS — R627 Adult failure to thrive: Secondary | ICD-10-CM | POA: Diagnosis not present

## 2020-12-30 DIAGNOSIS — C259 Malignant neoplasm of pancreas, unspecified: Secondary | ICD-10-CM | POA: Diagnosis not present

## 2020-12-31 ENCOUNTER — Encounter (HOSPITAL_COMMUNITY): Payer: Self-pay

## 2020-12-31 ENCOUNTER — Telehealth: Payer: Self-pay

## 2020-12-31 ENCOUNTER — Inpatient Hospital Stay (HOSPITAL_COMMUNITY)
Admission: EM | Admit: 2020-12-31 | Discharge: 2021-01-11 | DRG: 441 | Disposition: A | Discharge status: Hospice | Payer: Medicare Other | Attending: Internal Medicine | Admitting: Internal Medicine

## 2020-12-31 ENCOUNTER — Emergency Department (HOSPITAL_COMMUNITY): Payer: Medicare Other

## 2020-12-31 ENCOUNTER — Ambulatory Visit: Payer: Medicare Other

## 2020-12-31 ENCOUNTER — Other Ambulatory Visit: Payer: Self-pay

## 2020-12-31 DIAGNOSIS — D6959 Other secondary thrombocytopenia: Secondary | ICD-10-CM | POA: Diagnosis not present

## 2020-12-31 DIAGNOSIS — R5383 Other fatigue: Secondary | ICD-10-CM | POA: Diagnosis not present

## 2020-12-31 DIAGNOSIS — Z823 Family history of stroke: Secondary | ICD-10-CM

## 2020-12-31 DIAGNOSIS — D630 Anemia in neoplastic disease: Secondary | ICD-10-CM | POA: Diagnosis not present

## 2020-12-31 DIAGNOSIS — J90 Pleural effusion, not elsewhere classified: Secondary | ICD-10-CM | POA: Diagnosis not present

## 2020-12-31 DIAGNOSIS — E1165 Type 2 diabetes mellitus with hyperglycemia: Secondary | ICD-10-CM | POA: Diagnosis present

## 2020-12-31 DIAGNOSIS — M19042 Primary osteoarthritis, left hand: Secondary | ICD-10-CM | POA: Diagnosis present

## 2020-12-31 DIAGNOSIS — K750 Abscess of liver: Principal | ICD-10-CM | POA: Diagnosis present

## 2020-12-31 DIAGNOSIS — Z7401 Bed confinement status: Secondary | ICD-10-CM | POA: Diagnosis not present

## 2020-12-31 DIAGNOSIS — I10 Essential (primary) hypertension: Secondary | ICD-10-CM | POA: Diagnosis not present

## 2020-12-31 DIAGNOSIS — Z9221 Personal history of antineoplastic chemotherapy: Secondary | ICD-10-CM

## 2020-12-31 DIAGNOSIS — Z7189 Other specified counseling: Secondary | ICD-10-CM | POA: Diagnosis not present

## 2020-12-31 DIAGNOSIS — E876 Hypokalemia: Secondary | ICD-10-CM | POA: Diagnosis not present

## 2020-12-31 DIAGNOSIS — B961 Klebsiella pneumoniae [K. pneumoniae] as the cause of diseases classified elsewhere: Secondary | ICD-10-CM | POA: Diagnosis present

## 2020-12-31 DIAGNOSIS — K219 Gastro-esophageal reflux disease without esophagitis: Secondary | ICD-10-CM | POA: Diagnosis present

## 2020-12-31 DIAGNOSIS — R7989 Other specified abnormal findings of blood chemistry: Secondary | ICD-10-CM | POA: Diagnosis present

## 2020-12-31 DIAGNOSIS — F32A Depression, unspecified: Secondary | ICD-10-CM | POA: Diagnosis present

## 2020-12-31 DIAGNOSIS — J150 Pneumonia due to Klebsiella pneumoniae: Secondary | ICD-10-CM | POA: Diagnosis not present

## 2020-12-31 DIAGNOSIS — Z66 Do not resuscitate: Secondary | ICD-10-CM | POA: Diagnosis not present

## 2020-12-31 DIAGNOSIS — Z515 Encounter for palliative care: Secondary | ICD-10-CM | POA: Diagnosis not present

## 2020-12-31 DIAGNOSIS — N400 Enlarged prostate without lower urinary tract symptoms: Secondary | ICD-10-CM | POA: Diagnosis present

## 2020-12-31 DIAGNOSIS — Z833 Family history of diabetes mellitus: Secondary | ICD-10-CM

## 2020-12-31 DIAGNOSIS — Z20822 Contact with and (suspected) exposure to covid-19: Secondary | ICD-10-CM | POA: Diagnosis not present

## 2020-12-31 DIAGNOSIS — D689 Coagulation defect, unspecified: Secondary | ICD-10-CM | POA: Diagnosis present

## 2020-12-31 DIAGNOSIS — M47814 Spondylosis without myelopathy or radiculopathy, thoracic region: Secondary | ICD-10-CM | POA: Diagnosis not present

## 2020-12-31 DIAGNOSIS — R0902 Hypoxemia: Secondary | ICD-10-CM | POA: Diagnosis not present

## 2020-12-31 DIAGNOSIS — D72829 Elevated white blood cell count, unspecified: Secondary | ICD-10-CM

## 2020-12-31 DIAGNOSIS — R531 Weakness: Secondary | ICD-10-CM | POA: Diagnosis not present

## 2020-12-31 DIAGNOSIS — C259 Malignant neoplasm of pancreas, unspecified: Secondary | ICD-10-CM | POA: Diagnosis not present

## 2020-12-31 DIAGNOSIS — E118 Type 2 diabetes mellitus with unspecified complications: Secondary | ICD-10-CM | POA: Diagnosis not present

## 2020-12-31 DIAGNOSIS — E785 Hyperlipidemia, unspecified: Secondary | ICD-10-CM | POA: Diagnosis present

## 2020-12-31 DIAGNOSIS — B952 Enterococcus as the cause of diseases classified elsewhere: Secondary | ICD-10-CM | POA: Diagnosis not present

## 2020-12-31 DIAGNOSIS — Z8249 Family history of ischemic heart disease and other diseases of the circulatory system: Secondary | ICD-10-CM

## 2020-12-31 DIAGNOSIS — R7881 Bacteremia: Secondary | ICD-10-CM

## 2020-12-31 DIAGNOSIS — L89616 Pressure-induced deep tissue damage of right heel: Secondary | ICD-10-CM | POA: Diagnosis present

## 2020-12-31 DIAGNOSIS — D696 Thrombocytopenia, unspecified: Secondary | ICD-10-CM

## 2020-12-31 DIAGNOSIS — E119 Type 2 diabetes mellitus without complications: Secondary | ICD-10-CM

## 2020-12-31 DIAGNOSIS — J869 Pyothorax without fistula: Secondary | ICD-10-CM | POA: Diagnosis present

## 2020-12-31 DIAGNOSIS — K7689 Other specified diseases of liver: Secondary | ICD-10-CM | POA: Diagnosis not present

## 2020-12-31 DIAGNOSIS — J440 Chronic obstructive pulmonary disease with acute lower respiratory infection: Secondary | ICD-10-CM | POA: Diagnosis not present

## 2020-12-31 DIAGNOSIS — R4020 Unspecified coma: Secondary | ICD-10-CM | POA: Diagnosis not present

## 2020-12-31 DIAGNOSIS — R5381 Other malaise: Secondary | ICD-10-CM | POA: Diagnosis not present

## 2020-12-31 DIAGNOSIS — R0689 Other abnormalities of breathing: Secondary | ICD-10-CM | POA: Diagnosis not present

## 2020-12-31 DIAGNOSIS — J302 Other seasonal allergic rhinitis: Secondary | ICD-10-CM | POA: Diagnosis not present

## 2020-12-31 DIAGNOSIS — Z743 Need for continuous supervision: Secondary | ICD-10-CM | POA: Diagnosis not present

## 2020-12-31 DIAGNOSIS — Z79899 Other long term (current) drug therapy: Secondary | ICD-10-CM

## 2020-12-31 DIAGNOSIS — D849 Immunodeficiency, unspecified: Secondary | ICD-10-CM | POA: Diagnosis not present

## 2020-12-31 DIAGNOSIS — C250 Malignant neoplasm of head of pancreas: Secondary | ICD-10-CM | POA: Diagnosis present

## 2020-12-31 DIAGNOSIS — Z794 Long term (current) use of insulin: Secondary | ICD-10-CM

## 2020-12-31 DIAGNOSIS — A4159 Other Gram-negative sepsis: Secondary | ICD-10-CM | POA: Diagnosis not present

## 2020-12-31 DIAGNOSIS — G40909 Epilepsy, unspecified, not intractable, without status epilepticus: Secondary | ICD-10-CM | POA: Diagnosis present

## 2020-12-31 DIAGNOSIS — R410 Disorientation, unspecified: Secondary | ICD-10-CM | POA: Diagnosis not present

## 2020-12-31 DIAGNOSIS — R69 Illness, unspecified: Secondary | ICD-10-CM | POA: Diagnosis not present

## 2020-12-31 DIAGNOSIS — I959 Hypotension, unspecified: Secondary | ICD-10-CM | POA: Diagnosis not present

## 2020-12-31 DIAGNOSIS — I2510 Atherosclerotic heart disease of native coronary artery without angina pectoris: Secondary | ICD-10-CM | POA: Diagnosis not present

## 2020-12-31 DIAGNOSIS — R197 Diarrhea, unspecified: Secondary | ICD-10-CM | POA: Diagnosis present

## 2020-12-31 DIAGNOSIS — Z923 Personal history of irradiation: Secondary | ICD-10-CM

## 2020-12-31 LAB — URINALYSIS, ROUTINE W REFLEX MICROSCOPIC
Bacteria, UA: NONE SEEN
Glucose, UA: 150 mg/dL — AB
Ketones, ur: NEGATIVE mg/dL
Nitrite: NEGATIVE
Protein, ur: 100 mg/dL — AB
RBC / HPF: >50 RBC/hpf — ABNORMAL HIGH (ref 0–5)
Specific Gravity, Urine: 1.027 (ref 1.005–1.030)
WBC, UA: >50 WBC/hpf — ABNORMAL HIGH (ref 0–5)
pH: 5 (ref 5.0–8.0)

## 2020-12-31 LAB — MAGNESIUM: Magnesium: 2 mg/dL (ref 1.7–2.4)

## 2020-12-31 LAB — COMPREHENSIVE METABOLIC PANEL
ALT: 227 U/L — ABNORMAL HIGH (ref 0–44)
AST: 229 U/L — ABNORMAL HIGH (ref 15–41)
Albumin: 2.4 g/dL — ABNORMAL LOW (ref 3.5–5.0)
Alkaline Phosphatase: 1494 U/L — ABNORMAL HIGH (ref 38–126)
Anion gap: 9 (ref 5–15)
BUN: 27 mg/dL — ABNORMAL HIGH (ref 8–23)
CO2: 25 mmol/L (ref 22–32)
Calcium: 8.6 mg/dL — ABNORMAL LOW (ref 8.9–10.3)
Chloride: 104 mmol/L (ref 98–111)
Creatinine, Ser: 0.55 mg/dL — ABNORMAL LOW (ref 0.61–1.24)
GFR, Estimated: >60 mL/min (ref >60)
Glucose, Bld: 115 mg/dL — ABNORMAL HIGH (ref 70–99)
Potassium: 2.7 mmol/L — CL (ref 3.5–5.1)
Sodium: 138 mmol/L (ref 135–145)
Total Bilirubin: 3.7 mg/dL — ABNORMAL HIGH (ref 0.3–1.2)
Total Protein: 6.1 g/dL — ABNORMAL LOW (ref 6.5–8.1)

## 2020-12-31 LAB — CBC WITH DIFFERENTIAL/PLATELET
Abs Immature Granulocytes: 0.15 10*3/uL — ABNORMAL HIGH (ref 0.00–0.07)
Basophils Absolute: 0 10*3/uL (ref 0.0–0.1)
Basophils Relative: 0 %
Eosinophils Absolute: 0 10*3/uL (ref 0.0–0.5)
Eosinophils Relative: 0 %
HCT: 33.6 % — ABNORMAL LOW (ref 39.0–52.0)
Hemoglobin: 11.1 g/dL — ABNORMAL LOW (ref 13.0–17.0)
Immature Granulocytes: 1 %
Lymphocytes Relative: 2 %
Lymphs Abs: 0.4 10*3/uL — ABNORMAL LOW (ref 0.7–4.0)
MCH: 29.6 pg (ref 26.0–34.0)
MCHC: 33 g/dL (ref 30.0–36.0)
MCV: 89.6 fL (ref 80.0–100.0)
Monocytes Absolute: 0.2 10*3/uL (ref 0.1–1.0)
Monocytes Relative: 1 %
Neutro Abs: 15.5 10*3/uL — ABNORMAL HIGH (ref 1.7–7.7)
Neutrophils Relative %: 96 %
Platelets: 83 10*3/uL — ABNORMAL LOW (ref 150–400)
RBC: 3.75 MIL/uL — ABNORMAL LOW (ref 4.22–5.81)
RDW: 21.2 % — ABNORMAL HIGH (ref 11.5–15.5)
WBC: 16.3 10*3/uL — ABNORMAL HIGH (ref 4.0–10.5)
nRBC: 0 % (ref 0.0–0.2)

## 2020-12-31 LAB — RESP PANEL BY RT-PCR (FLU A&B, COVID) ARPGX2
Influenza A by PCR: NEGATIVE
Influenza B by PCR: NEGATIVE
SARS Coronavirus 2 by RT PCR: NEGATIVE

## 2020-12-31 LAB — GLUCOSE, CAPILLARY: Glucose-Capillary: 135 mg/dL — ABNORMAL HIGH (ref 70–99)

## 2020-12-31 LAB — LIPASE, BLOOD: Lipase: 16 U/L (ref 11–51)

## 2020-12-31 LAB — CBG MONITORING, ED: Glucose-Capillary: 109 mg/dL — ABNORMAL HIGH (ref 70–99)

## 2020-12-31 LAB — AMMONIA: Ammonia: 44 umol/L — ABNORMAL HIGH (ref 9–35)

## 2020-12-31 LAB — LACTIC ACID, PLASMA: Lactic Acid, Venous: 3.2 mmol/L (ref 0.5–1.9)

## 2020-12-31 MED ORDER — VANCOMYCIN HCL IN DEXTROSE 1-5 GM/200ML-% IV SOLN: 1000.0000 mg | Freq: Once | INTRAVENOUS | Status: DC

## 2020-12-31 MED ORDER — METRONIDAZOLE 500 MG/100ML IV SOLN
500.0000 mg | Freq: Three times a day (TID) | INTRAVENOUS | Status: DC
  Administered 2020-12-31 – 2021-01-03 (×8): 500 mg via INTRAVENOUS
  Filled 2020-12-31 (×8): qty 100

## 2020-12-31 MED ORDER — TAMSULOSIN HCL 0.4 MG PO CAPS
0.8000 mg | ORAL_CAPSULE | Freq: Every day | ORAL | Status: DC
  Administered 2020-12-31: 0.8 mg via ORAL
  Filled 2020-12-31 (×2): qty 2

## 2020-12-31 MED ORDER — PIPERACILLIN-TAZOBACTAM 3.375 G IVPB 30 MIN
3.3750 g | Freq: Once | INTRAVENOUS | Status: AC
  Administered 2020-12-31: 3.375 g via INTRAVENOUS
  Filled 2020-12-31: qty 50

## 2020-12-31 MED ORDER — POTASSIUM CHLORIDE 10 MEQ/100ML IV SOLN
10.0000 meq | INTRAVENOUS | Status: AC
  Administered 2020-12-31 (×3): 10 meq via INTRAVENOUS
  Filled 2020-12-31 (×3): qty 100

## 2020-12-31 MED ORDER — HYDROMORPHONE HCL 1 MG/ML IJ SOLN
0.5000 mg | INTRAMUSCULAR | Status: DC | PRN
  Administered 2021-01-01: 1 mg via INTRAVENOUS
  Filled 2020-12-31: qty 1

## 2020-12-31 MED ORDER — POTASSIUM CHLORIDE 10 MEQ/100ML IV SOLN
10.0000 meq | Freq: Once | INTRAVENOUS | Status: AC
  Administered 2020-12-31: 10 meq via INTRAVENOUS
  Filled 2020-12-31: qty 100

## 2020-12-31 MED ORDER — GLUCERNA SHAKE PO LIQD
237.0000 mL | Freq: Two times a day (BID) | ORAL | Status: DC
  Filled 2020-12-31 (×2): qty 237

## 2020-12-31 MED ORDER — PANTOPRAZOLE SODIUM 40 MG PO TBEC
40.0000 mg | DELAYED_RELEASE_TABLET | Freq: Every day | ORAL | Status: DC
  Administered 2020-12-31: 40 mg via ORAL
  Filled 2020-12-31 (×2): qty 1

## 2020-12-31 MED ORDER — ONDANSETRON HCL 4 MG/2ML IJ SOLN
4.0000 mg | Freq: Four times a day (QID) | INTRAMUSCULAR | Status: DC | PRN
  Administered 2020-12-31: 4 mg via INTRAVENOUS
  Filled 2020-12-31: qty 2

## 2020-12-31 MED ORDER — LINEZOLID 600 MG/300ML IV SOLN
600.0000 mg | Freq: Two times a day (BID) | INTRAVENOUS | Status: DC
  Administered 2020-12-31: 600 mg via INTRAVENOUS
  Filled 2020-12-31: qty 300

## 2020-12-31 MED ORDER — LEVETIRACETAM 500 MG PO TABS
1000.0000 mg | ORAL_TABLET | Freq: Two times a day (BID) | ORAL | Status: DC
  Filled 2020-12-31 (×2): qty 2

## 2020-12-31 MED ORDER — MIDODRINE HCL 5 MG PO TABS: 5.0000 mg | ORAL_TABLET | Freq: Three times a day (TID) | ORAL | Status: DC

## 2020-12-31 MED ORDER — SODIUM CHLORIDE 0.9 % IV SOLN
2.0000 g | INTRAVENOUS | Status: DC
  Administered 2020-12-31 – 2021-01-02 (×3): 2 g via INTRAVENOUS
  Filled 2020-12-31 (×3): qty 2

## 2020-12-31 MED ORDER — PANCRELIPASE (LIP-PROT-AMYL) 12000-38000 UNITS PO CPEP
24000.0000 [IU] | ORAL_CAPSULE | Freq: Three times a day (TID) | ORAL | Status: DC
  Administered 2021-01-01 – 2021-01-02 (×3): 24000 [IU] via ORAL
  Filled 2020-12-31 (×5): qty 2

## 2020-12-31 MED ORDER — INSULIN ASPART 100 UNIT/ML IJ SOLN
0.0000 [IU] | Freq: Every day | INTRAMUSCULAR | Status: DC
  Filled 2020-12-31: qty 0.05

## 2020-12-31 MED ORDER — SODIUM CHLORIDE 0.9 % IV BOLUS
500.0000 mL | Freq: Once | INTRAVENOUS | Status: AC
  Administered 2020-12-31: 500 mL via INTRAVENOUS

## 2020-12-31 MED ORDER — ONDANSETRON HCL 4 MG PO TABS: 4.0000 mg | ORAL_TABLET | Freq: Four times a day (QID) | ORAL | Status: DC | PRN

## 2020-12-31 MED ORDER — MIDODRINE HCL 2.5 MG PO TABS
2.5000 mg | ORAL_TABLET | Freq: Two times a day (BID) | ORAL | Status: DC
  Filled 2020-12-31: qty 1

## 2020-12-31 MED ORDER — OXYCODONE HCL 5 MG PO TABS
5.0000 mg | ORAL_TABLET | ORAL | Status: DC | PRN
Start: 2020-12-31 — End: 2021-01-03

## 2020-12-31 MED ORDER — POTASSIUM CHLORIDE 10 MEQ/100ML IV SOLN
10.0000 meq | INTRAVENOUS | Status: AC
  Administered 2020-12-31 (×2): 10 meq via INTRAVENOUS
  Filled 2020-12-31 (×2): qty 100

## 2020-12-31 MED ORDER — LACTATED RINGERS IV SOLN: INTRAVENOUS | Status: DC

## 2020-12-31 MED ORDER — VANCOMYCIN HCL 1500 MG/300ML IV SOLN
1500.0000 mg | Freq: Once | INTRAVENOUS | Status: AC
  Administered 2020-12-31: 1500 mg via INTRAVENOUS
  Filled 2020-12-31: qty 300

## 2020-12-31 MED ORDER — INSULIN ASPART 100 UNIT/ML IJ SOLN
0.0000 [IU] | Freq: Three times a day (TID) | INTRAMUSCULAR | Status: DC
  Administered 2021-01-01: 2 [IU] via SUBCUTANEOUS
  Filled 2020-12-31: qty 0.09

## 2020-12-31 NOTE — Telephone Encounter (Signed)
Patient is in rehab after breaking his hip. Wife is worried that he is getting worse and needs to start radiation treatments. States she will get an ambulance to pick him up and bring him over. I will discuss this with Dr [name] and call the wife back.

## 2020-12-31 NOTE — ED Notes (Signed)
Pt unable to sign MSE d/t weakness/AMS. This RN and 2nd RN signed as witnesses

## 2020-12-31 NOTE — ED Notes (Signed)
Did not obtain enough urine for culture to be sent WiIl need to obtain fresh urine sample if culture is needed

## 2020-12-31 NOTE — Plan of Care (Signed)
  Problem: Education: Goal: Knowledge of General Education information will improve Description: Including pain rating scale, medication(s)/side effects and non-pharmacologic comfort measures Outcome: Progressing   Problem: Health Behavior/Discharge Planning: Goal: Ability to manage health-related needs will improve 12/31/2020 1839 by Mohammed Kindle, RN Outcome: Progressing 12/31/2020 1838 by Mohammed Kindle, RN Outcome: Progressing   Problem: Clinical Measurements: Goal: Ability to maintain clinical measurements within normal limits will improve Outcome: Progressing Goal: Will remain free from infection 12/31/2020 1839 by Mohammed Kindle, RN Outcome: Progressing 12/31/2020 1838 by Mohammed Kindle, RN Outcome: Progressing Goal: Diagnostic test results will improve Outcome: Progressing Goal: Respiratory complications will improve Outcome: Progressing Goal: Cardiovascular complication will be avoided Outcome: Progressing   Problem: Activity: Goal: Risk for activity intolerance will decrease 12/31/2020 1839 by Mohammed Kindle, RN Outcome: Progressing 12/31/2020 1838 by Mohammed Kindle, RN Outcome: Progressing   Problem: Nutrition: Goal: Adequate nutrition will be maintained 12/31/2020 1839 by Mohammed Kindle, RN Outcome: Progressing 12/31/2020 1838 by Mohammed Kindle, RN Outcome: Progressing   Problem: Coping: Goal: Level of anxiety will decrease Outcome: Progressing   Problem: Elimination: Goal: Will not experience complications related to bowel motility Outcome: Progressing Goal: Will not experience complications related to urinary retention Outcome: Progressing   Problem: Pain Managment: Goal: General experience of comfort will improve Outcome: Progressing   Problem: Safety: Goal: Ability to remain free from injury will improve Outcome: Progressing   Problem: Skin Integrity: Goal: Risk for impaired skin integrity will  decrease Outcome: Progressing

## 2020-12-31 NOTE — ED Notes (Signed)
Family at bedside. 

## 2020-12-31 NOTE — ED Triage Notes (Signed)
Pt sent from [place] for abnormal labs, lethargy. WBC 22, Na 133. Received 500  NS en route. Pt has hx of prostate ca (unsure if receiving treatment), pt A&O x 3  116/70 HR 100 NSR bgl 136 rr 18

## 2020-12-31 NOTE — Telephone Encounter (Signed)
Colton Bush left vm wanting to speak to Dr [name] and no one else.  She states her husband is very weak.  She feels he needs to have radiation treatment.

## 2020-12-31 NOTE — Telephone Encounter (Signed)
I called Mrs. Corrales back.  She states that [name] is not doing well at the rehab, he is not even able to get out of bed.  She is not happy with the rehab, she has talked to social work over there, but she is not able to take him home due to the care he needs.  She also does not feel safe to visit him over there. She wants him to get radiation for his recurrent pancreatic cancer.  I explained to her that [name] is not in the physical condition to get radiation, and his a physical condition is more related to recent fracture, infection, and depression.  I encourage him to try rehab, and encouraged her to call me back when he is ready to leave rehab and go home. If things are not getting better, we will also consider palliative care and hospice.  Although his recurrent cancer is not causing most of his physical deterioration now, in the long run, cancer will likely case more symptoms on top of what he has had. I explained the above to Mrs. Gaskill and she voiced understanding.  Truitt Merle  12/31/2020

## 2020-12-31 NOTE — Progress Notes (Signed)
Pt. Came in the unit @ 1815hr from ED via stretcher. Pt. Is oriented to the room and use of call bell. Wife at bedside. Vital sign taken and recorded.

## 2020-12-31 NOTE — ED Notes (Signed)
Pt. On condom cath. No urine sample provided at this time.

## 2020-12-31 NOTE — ED Provider Notes (Signed)
[facility] DEPT Provider Note   CSN: [id number] Arrival date & time: 12/31/20  1011     History Chief Complaint  Patient presents with  . abnormal labs    Colton Bush is a 65 y.o. male with history of pancreatic adenocarcinoma with recurrence s/p chemotherapy, Whipple surgery and radiation, seizures on keppra, presents to the ED from rehab facility for evaluation of abnormal labs.  Per triage note, WBC is 22,000 and NA is 133.  Patient received 500 NS on route by EMS.  Additional information obtained directly from patient.  He is tired appearing but alert.  Able to tell me his full name, hospital, year.  States he is here for a "x-ray for his pancreatic cancer".  Reports feeling tired but offers no other concerns.  Specifically denies subjective fevers, chills, chest pain, shortness of breath, cough, nausea, vomiting, abdominal pain, diarrhea, urinary symptoms.  He is at the rehab facility for his recent right hip fracture.  States he has been able to walk there with assistance.  Patient is slow to answer.  Will call wife to obtain collateral information.  Of note patient has had two recent hospitalizations for right fracture s/p IM nail March 1448 complicated by E.coli UTI, strep anginosis bacteremia and April 2022 for severe sepsis 2/2 liver abscesses s/p JP drain and right empyema, SDH requiring burr holes.  HPI     Past Medical History:  Diagnosis Date  . Arthritis    left hand  . Bronchitis 1977  . Cancer ([place]) 03/09/2016   pancreatic cancer, sees Dr. Cristino Martes at [facility]   . Depression    takes Cymbalta daily  . Diabetes mellitus type II    sees Dr. Chalmers Cater   . GERD (gastroesophageal reflux disease)    takes Omeprazole daily  . H/O hiatal hernia   . Hyperlipidemia    takes Zocor daily  . Hypertension    takes Amlodipine daily  . Hypoglycemia 06/18/2017  . Neck pain    C4-7 stenosis and herniated disc  . Neuromuscular disorder ([place])     hiatal hernia  . Scoliosis    slight  . Spinal cord injury, C5-C7 ([place])    c4-c7  . Stiffness of hand joint    d/t cervical issues    Patient Active Problem List   Diagnosis Date Noted  . Anemia of chronic illness 12/09/2020  . Hypokalemia 12/09/2020  . Nausea & vomiting   . Pain   . Bladder outlet obstruction 11/12/2020  . Toxic encephalopathy 11/12/2020  . Atrophic pancreas 11/12/2020  . Empyema lung ([place])   . Subdural hematoma ([place])   . Pneumonia of both lower lobes due to infectious organism 11/06/2020  . Liver abscess 11/06/2020  . Sepsis ([place]) 11/05/2020  . Uncontrolled type 2 diabetes mellitus with ketoacidosis without coma, with long-term current use of insulin ([place]) 11/05/2020  . Increased anion gap metabolic acidosis 18/56/3149  . Chronic diastolic CHF (congestive heart failure) ([place]) 11/05/2020  . Closed right hip fracture, initial encounter ([place]) 10/18/2020  . Fall from ground level 10/18/2020  . Hypoglycemia 06/19/2017  . Hypothermia 06/19/2017  . Goals of care, counseling/discussion 09/29/2016  . Port catheter in place 04/11/2016  . Hypercalcemia 03/29/2016  . Adenocarcinoma of head of pancreas ([place]) 03/17/2016  . Biliary obstruction   . Obstructive jaundice due to malignant neoplasm ([place]) 02/27/2016  . DKA (diabetic ketoacidosis) ([place]) 02/27/2016  . Mixed diabetic hyperlipidemia associated with type 2 diabetes mellitus ([place]) 05/05/2014  .  Cervical spondylosis with myelopathy 08/18/2011  . CERUMEN IMPACTION 12/14/2008  . Diabetes ([place]) 09/16/2007  . Hyperlipidemia, mixed 09/16/2007  . Essential hypertension 09/16/2007  . GERD without esophagitis 09/16/2007  . ESOPHAGEAL STRICTURE 04/04/2007  . HIATAL HERNIA 04/04/2007    Past Surgical History:  Procedure Laterality Date  . ANTERIOR CERVICAL DECOMP/DISCECTOMY FUSION  08/18/2011   Procedure: ANTERIOR CERVICAL DECOMPRESSION/DISCECTOMY FUSION 3 LEVELS;  Surgeon: Winfield Cunas, MD;  Location: [facility];   Service: Neurosurgery;  Laterality: N/A;  Anterior Cervical Four-Five/Five-Six/Six-Seven Decompression with Fusion, Plating, and Bonegraft  . BURR HOLE Left 11/09/2020   Procedure: LEFT BURR HOLES FOR EVACUATION OF Subdural Hematoma;  Surgeon: Judith Part, MD;  Location: [facility];  Service: Neurosurgery;  Laterality: Left;  . CARPAL TUNNEL RELEASE  2013   bilateral, per Dr. Christella Noa   . COLONOSCOPY  10-30-14   per Dr. Olevia Perches, clear, repeat in 10 yrs   . egd with esophageal dilation  9-08   per Dr. Olevia Perches  . ERCP N/A 03/01/2016   Procedure: ENDOSCOPIC RETROGRADE CHOLANGIOPANCREATOGRAPHY (ERCP) with brushings and stent;  Surgeon: Doran Stabler, MD;  Location: [facility];  Service: Endoscopy;  Laterality: N/A;  . ESOPHAGOGASTRODUODENOSCOPY (EGD) WITH PROPOFOL N/A 05/27/2020   Procedure: ESOPHAGOGASTRODUODENOSCOPY (EGD) WITH PROPOFOL;  Surgeon: Milus Banister, MD;  Location: [facility];  Service: Endoscopy;  Laterality: N/A;  . EUS N/A 03/09/2016   Procedure: ESOPHAGEAL ENDOSCOPIC ULTRASOUND (EUS) RADIAL;  Surgeon: Milus Banister, MD;  Location: [facility];  Service: Endoscopy;  Laterality: N/A;  . EUS N/A 05/27/2020   Procedure: UPPER ENDOSCOPIC ULTRASOUND (EUS) RADIAL;  Surgeon: Milus Banister, MD;  Location: [facility];  Service: Endoscopy;  Laterality: N/A;  . FEMUR IM NAIL Right 10/25/2020   Procedure: INTRAMEDULLARY (IM) NAIL FEMORAL;  Surgeon: Rod Can, MD;  Location: [facility];  Service: Orthopedics;  Laterality: Right;  . IR THORACENTESIS ASP PLEURAL SPACE W/IMG GUIDE  11/19/2020  . lymph nodes biopsy    . melanoma rt calf  1999  . PORT-A-CATH REMOVAL N/A 04/03/2019   Procedure: PORT REMOVAL;  Surgeon: Stark Klein, MD;  Location: [facility];  Service: General;  Laterality: N/A;  . PORTACATH PLACEMENT Left 03/22/2016   Procedure: INSERTION PORT-A-CATH;  Surgeon: Stark Klein, MD;  Location: [facility];  Service: General;  Laterality: Left;  . SPINE SURGERY    . TONSILLECTOMY      as a child  . ULNAR TUNNEL RELEASE  2013   right arm, per Dr. Christella Noa   . UPPER GASTROINTESTINAL ENDOSCOPY    . WHIPPLE PROCEDURE N/A 09/19/2016   Procedure: DIAGNOSTIC LAPAROSCOPY, LAPAROSCOPIC LIVER BIOPSY, RETROPERITONEAL EXPLORATION, INTRAOPERATIVE ULTRASOUND;  Surgeon: Stark Klein, MD;  Location: [facility];  Service: General;  Laterality: N/A;       Family History  Problem Relation Age of Onset  . Heart disease Father   . Heart disease Brother 96  . Anesthesia problems Mother   . Heart disease Mother   . Dementia Mother   . Diabetes Sister   . Stroke Sister   . Colon cancer Neg Hx   . Rectal cancer Neg Hx   . Stomach cancer Neg Hx     Social History   Tobacco Use  . Smoking status: Never Smoker  . Smokeless tobacco: Never Used  . Tobacco comment: tried a pipe 35 years ago   Vaping Use  . Vaping Use: Never used  Substance Use Topics  . Alcohol use: No    Alcohol/week:  0.0 standard drinks  . Drug use: No    Home Medications Prior to Admission medications   Medication Sig Start Date End Date Taking? Authorizing Provider  acetaminophen (TYLENOL) 325 MG tablet Take 1-2 tablets (325-650 mg total) by mouth every 4 (four) hours as needed for mild pain. Patient taking differently: Take 650 mg by mouth every 4 (four) hours as needed for mild pain. 12/08/20  Yes Angiulli, Lavon Paganini, PA-C  ascorbic acid (VITAMIN C) 500 MG tablet Take 1 tablet (500 mg total) by mouth daily. 12/08/20  Yes Angiulli, Lavon Paganini, PA-C  citalopram (CELEXA) 40 MG tablet Take 1 tablet (40 mg total) by mouth daily. 12/21/20  Yes Dessa Phi, DO  diclofenac Sodium (VOLTAREN) 1 % GEL Apply 2 g topically 4 (four) times daily. To right knee 12/08/20  Yes Angiulli, Lavon Paganini, PA-C  esomeprazole (NEXIUM) 20 MG capsule Take 20 mg by mouth daily.   Yes [provider]  feeding supplement, GLUCERNA SHAKE, (GLUCERNA SHAKE) LIQD Take 237 mLs by mouth 2 (two) times daily between meals.   Yes [provider]  hydrocerin (EUCERIN) CREA Apply 1 application topically 2 (two) times daily. To legs and back 12/08/20  Yes Angiulli, Lavon Paganini, PA-C  HYDROcodone-acetaminophen (NORCO/VICODIN) 5-325 MG tablet Take 1 tablet by mouth every 3 (three) hours as needed for severe pain. 12/21/20  Yes Dessa Phi, DO  hydrocortisone cream 1 % Apply topically 2 (two) times daily. To back/areas of itching 12/08/20  Yes Angiulli, Lavon Paganini, PA-C  insulin aspart (NOVOLOG FLEXPEN) 100 UNIT/ML FlexPen Inject 2-13 Units into the skin 3 (three) times daily with meals. Sliding Scale insulin for blood sugars from 101-150 - 2 units. For blood sugars from 151-200 - 3 units. For blood sugars from 201-250 - 5 units. For blood sugars from 301-350 use 7 units. Call MD if blood sugars are over 300. Patient taking differently: Inject 2-9 Units into the skin 3 (three) times daily with meals. Sliding Scale insulin for blood sugars from 101-150 - 2 units. For blood sugars from 151-200 - 3 units. For blood sugars from 201-250 - 5 units. For blood sugars from 301-350 use 7 units. Call MD if blood sugars are over 350 12/08/20  Yes Corle, Ivan Anchors, PA-C  insulin detemir (LEVEMIR) 100 UNIT/ML FlexPen Inject 14 Units into the skin daily. Patient taking differently: Inject 14 Units into the skin at bedtime. 12/21/20  Yes Dessa Phi, DO  levETIRAcetam (KEPPRA) 1000 MG tablet Take 1 tablet (1,000 mg total) by mouth 2 (two) times daily. 12/08/20  Yes Angiulli, Lavon Paganini, PA-C  lidocaine (LIDODERM) 5 % Place 1 patch onto the skin daily. Apply to lower back at 8 pm and remove at 8 am daily 12/08/20  Yes Angiulli, Lavon Paganini, PA-C  linezolid (ZYVOX) 600 MG tablet Take 1 tablet (600 mg total) by mouth every 12 (twelve) hours for 22 days. 12/21/20 01/10/2021 Yes Dessa Phi, DO  lipase/protease/amylase 24000-76000 units CPEP Take 1 capsule (24,000 Units total) by mouth 3 (three) times daily before meals. 12/08/20  Yes Angiulli, Lavon Paganini, PA-C  loperamide  (IMODIUM) 2 MG capsule Take 1 capsule (2 mg total) by mouth daily as needed for diarrhea or loose stools. 12/28/20  Yes Carlisle Cater, PA-C  loratadine (CLARITIN) 10 MG tablet Take 1 tablet (10 mg total) by mouth daily. 12/08/20  Yes Angiulli, Lavon Paganini, PA-C  midodrine (PROAMATINE) 5 MG tablet Take 1 tablet (5 mg total) by mouth 3 (three) times daily with meals.  12/08/20  Yes Angiulli, Lavon Paganini, PA-C  Multiple Vitamin (MULTIVITAMIN WITH MINERALS) TABS tablet Take 1 tablet by mouth daily.   Yes [provider]  omeprazole (PRILOSEC) 20 MG capsule TAKE 1 CAPSULE DAILY Patient taking differently: Take 20 mg by mouth daily. 11/17/20  Yes [name], MD  ondansetron (ZOFRAN-ODT) 4 MG disintegrating tablet Take 1 tablet (4 mg total) by mouth every 6 (six) hours as needed for nausea or vomiting (can alternate with compazine). 12/08/20  Yes Angiulli, Lavon Paganini, PA-C  polycarbophil (FIBERCON) 625 MG tablet Take 1 tablet (625 mg total) by mouth 2 (two) times daily. 12/08/20  Yes Angiulli, Lavon Paganini, PA-C  polyethylene glycol (MIRALAX / GLYCOLAX) 17 g packet Take 17 g by mouth daily as needed for mild constipation. 11/12/20  Yes Debbe Odea, MD  simethicone (MYLICON) 80 MG chewable tablet Chew 1 tablet (80 mg total) by mouth 4 (four) times daily -  with meals and at bedtime. 12/08/20  Yes Angiulli, Lavon Paganini, PA-C  tamsulosin (FLOMAX) 0.4 MG CAPS capsule Take 2 capsules (0.8 mg total) by mouth daily. 12/08/20  Yes Angiulli, Lavon Paganini, PA-C  zinc sulfate 220 (50 Zn) MG capsule Take 1 capsule (220 mg total) by mouth daily. 12/08/20  Yes Angiulli, Lavon Paganini, PA-C  Insulin Pen Needle (PEN NEEDLES) 32G X 6 MM MISC 1 application by Does not apply route as needed. 12/08/20   Bary Leriche, PA-C    Allergies    Patient has no known allergies.  Review of Systems   Review of Systems  Neurological: Positive for weakness.  All other systems reviewed and are negative.   Physical Exam Updated Vital Signs BP 138/69    Pulse 92   Temp 99.8 F (37.7 C) (Rectal)   Resp 16   Ht 5' 11" (1.803 m)   Wt 70 kg   SpO2 96%   BMI 21.52 kg/m   Physical Exam Vitals and nursing note reviewed.  Constitutional:      General: He is not in acute distress.    Appearance: He is well-developed.     Comments: Appears older than stated age.  Appears tired but non toxic   HENT:     Head: Normocephalic and atraumatic.     Right Ear: External ear normal.     Left Ear: External ear normal.     Nose: Nose normal.     Mouth/Throat:     Mouth: Mucous membranes are dry.     Comments: Lips, MM and tongue very dry. Black/brown coating over tongue Eyes:     General: No scleral icterus.    Conjunctiva/sclera: Conjunctivae normal.  Cardiovascular:     Rate and Rhythm: Normal rate and regular rhythm.     Heart sounds: Normal heart sounds. No murmur heard.     Comments: No lower extremity edema.  No calf tenderness Pulmonary:     Effort: Pulmonary effort is normal.     Breath sounds: Normal breath sounds. No wheezing.     Comments: Diminished lung sounds in lower lobes bilaterally.  Questionable rhonchi in the right mid/lower lungs.  Speaking in full sentences.  No increased work of breathing. Abdominal:     General: There is no distension.     Palpations: Abdomen is soft.     Comments: Old ventral/midline surgical scar noted.  No suprapubic or CVA tenderness  Musculoskeletal:        General: No deformity. Normal range of motion.     Cervical  back: Normal range of motion and neck supple.     Comments: Pelvis: No pain with A.P.L. compression.  No pain with logroll bilaterally.  Patient able to lift both legs against gravity off the bed.  Skin:    General: Skin is warm and dry.     Capillary Refill: Capillary refill takes less than 2 seconds.  Neurological:     Mental Status: He is alert and oriented to person, place, and time.     Comments:  Awake, alert. Oriented to full name, Elvina Sidle, 2022.  Speech slow but  clear, responds in 1-2 words. Sensation to light touch intact in face, upper/lower extremities. Strength equal and symmetric bilaterally. No arm or leg drop/drift. Patient cannot follow commands to perform FTN. CN 2-12 intact.   Psychiatric:        Behavior: Behavior normal.        Thought Content: Thought content normal.        Judgment: Judgment normal.     ED Results / Procedures / Treatments   Labs (all labs ordered are listed, but only abnormal results are displayed) Labs Reviewed  CBC WITH DIFFERENTIAL/PLATELET - Abnormal; Notable for the following components:      Result Value   WBC 16.3 (*)    RBC 3.75 (*)    Hemoglobin 11.1 (*)    HCT 33.6 (*)    RDW 21.2 (*)    Platelets 83 (*)    Neutro Abs 15.5 (*)    Lymphs Abs 0.4 (*)    Abs Immature Granulocytes 0.15 (*)    All other components within normal limits  COMPREHENSIVE METABOLIC PANEL - Abnormal; Notable for the following components:   Potassium 2.7 (*)    Glucose, Bld 115 (*)    BUN 27 (*)    Creatinine, Ser 0.55 (*)    Calcium 8.6 (*)    Total Protein 6.1 (*)    Albumin 2.4 (*)    AST 229 (*)    ALT 227 (*)    Alkaline Phosphatase 1,494 (*)    Total Bilirubin 3.7 (*)    All other components within normal limits  LACTIC ACID, PLASMA - Abnormal; Notable for the following components:   Lactic Acid, Venous 3.2 (*)    All other components within normal limits  AMMONIA - Abnormal; Notable for the following components:   Ammonia 44 (*)    All other components within normal limits  URINALYSIS, ROUTINE W REFLEX MICROSCOPIC - Abnormal; Notable for the following components:   Color, Urine AMBER (*)    APPearance HAZY (*)    Glucose, UA 150 (*)    Hgb urine dipstick MODERATE (*)    Bilirubin Urine SMALL (*)    Protein, ur 100 (*)    Leukocytes,Ua MODERATE (*)    RBC / HPF >50 (*)    WBC, UA >50 (*)    Non Squamous Epithelial 0-5 (*)    All other components within normal limits  RESP PANEL BY RT-PCR (FLU A&B,  COVID) ARPGX2  CULTURE, BLOOD (ROUTINE X 2)  CULTURE, BLOOD (ROUTINE X 2)  LIPASE, BLOOD  MAGNESIUM  LACTIC ACID, PLASMA    EKG EKG Interpretation  Date/Time:  Friday December 31 2020 10:26:23 EDT Ventricular Rate:  92 PR Interval:  149 QRS Duration: 98 QT Interval:  403 QTC Calculation: 499 R Axis:   72 Text Interpretation: Sinus rhythm Probable left ventricular hypertrophy Borderline prolonged QT interval Confirmed by Thamas Jaegers ([id number]) on 12/31/2020 2:19:52 PM  Radiology CT ABDOMEN PELVIS WO CONTRAST  Result Date: 12/31/2020 CLINICAL DATA:  Abnormal labs and lethargy. EXAM: CT CHEST, ABDOMEN AND PELVIS WITHOUT CONTRAST TECHNIQUE: Multidetector CT imaging of the chest, abdomen and pelvis was performed following the standard protocol without IV contrast. COMPARISON:  None. FINDINGS: CT CHEST FINDINGS Cardiovascular: There is mild calcification of the aortic arch without evidence of aneurysmal dilatation. Normal heart size. No pericardial effusion. Mediastinum/Nodes: No enlarged mediastinal, hilar, or axillary lymph nodes. Thyroid gland, trachea, and esophagus demonstrate no significant findings. Lungs/Pleura: Mild to moderate severity atelectasis and/or infiltrate is seen within the superior segment of the right lower lobe and posterior aspect of the right lung base. This is decreased in severity when compared to the prior study. A trace amount of right pleural effusion is noted which is also decreased in size when compared to the prior exam. No pneumothorax is identified. Musculoskeletal: A metallic density fusion plate and screws are seen along the anterior aspect of the lower cervical spine. Multilevel degenerative changes seen throughout the thoracic spine. CT ABDOMEN PELVIS FINDINGS Hepatobiliary: Multiple heterogeneous low-attenuation liver lesions are seen. The largest measures approximately 3.6 cm x 3.7 cm and is located within the posterior aspect of the right lobe of the liver. These  areas are increased in size and number when compared to the prior study. Status post cholecystectomy. No biliary dilatation. Pancreas: Unremarkable. No pancreatic ductal dilatation or surrounding inflammatory changes. Spleen: Normal in size without focal abnormality. Adrenals/Urinary Tract: Adrenal glands are unremarkable. Kidneys are normal, without renal calculi, focal lesion, or hydronephrosis. A small amount of air is seen within the lumen of an otherwise normal appearing urinary bladder. Stomach/Bowel: Surgical sutures are seen within the gastric region. The appendix is not clearly identified. No evidence of bowel wall thickening, distention, or inflammatory changes. Vascular/Lymphatic: Aortic atherosclerosis. No enlarged abdominal or pelvic lymph nodes. Reproductive: Prostate is unremarkable. Other: No abdominal wall hernia or abnormality. No abdominopelvic ascites. Musculoskeletal: A metallic density intramedullary rod and compression screw device are seen within the proximal right femur. Multilevel degenerative changes seen throughout the lumbar spine. IMPRESSION: 1. Low-attenuation liver lesions which may represent multiple hepatic abscesses. Sequelae associated with metastatic disease cannot be excluded. 2. Mild to moderate severity right lower lobe atelectasis and/or infiltrate, decreased in severity when compared to the prior study. 3. Trace right pleural effusion. 4. Evidence of prior cholecystectomy. Electronically Signed   By: Virgina Norfolk M.D.   On: 12/31/2020 15:52   DG Chest 1 View  Result Date: 12/31/2020 CLINICAL DATA:  Leukocytosis. EXAM: CHEST  1 VIEW COMPARISON:  Dec 16, 1918 22. May 18 chest radiograph. May 19 chest CT. FINDINGS: Similar versus mildly improved opacities in the right midlung, likely relating to pleural fluid/empyema and overlying atelectasis or pneumonia, better characterized on prior CT chest. No new consolidation. No visible pneumothorax on this limited AP supine  radiograph. Similar elevation right hemidiaphragm. Similar cardiomediastinal silhouette. Partially imaged cervical fusion hardware. IMPRESSION: Similar versus mildly improved opacities in the right midlung, likely relating to pleural fluid/empyema and overlying atelectasis or pneumonia, better characterized on prior CT chest. A repeat CT chest with contrast could allow for more sensitive evaluation for change if clinically indicated. Electronically Signed   By: Margaretha Sheffield MD   On: 12/31/2020 12:29   CT Head Wo Contrast  Result Date: 12/31/2020 CLINICAL DATA:  Confusion.  History of pancreatic cancer EXAM: CT HEAD WITHOUT CONTRAST TECHNIQUE: Contiguous axial images were obtained from the base of the skull through  the vertex without intravenous contrast. COMPARISON:  CT head 12/13/2020 FINDINGS: Brain: Mild ventricular enlargement unchanged likely due to atrophy. Mild periventricular white matter hypodensity bilaterally. Mild dural thickening or isodense fluid collection left frontal region measuring 3 mm unchanged from the prior study. No acute infarct or mass. Vascular: Negative for hyperdense vessel Skull: Left frontal burr hole.  No acute abnormality. Sinuses/Orbits: Negative Other: None IMPRESSION: 3 mm thickening of the left frontal dura versus isodense fluid collection, unchanged from the prior study. No new hemorrhage. Electronically Signed   By: Franchot Gallo M.D.   On: 12/31/2020 12:56   CT Chest Wo Contrast  Result Date: 12/31/2020 CLINICAL DATA:  Abnormal labs and lethargy. EXAM: CT CHEST, ABDOMEN AND PELVIS WITHOUT CONTRAST TECHNIQUE: Multidetector CT imaging of the chest, abdomen and pelvis was performed following the standard protocol without IV contrast. COMPARISON:  Dec 16, 2020 FINDINGS: CT CHEST FINDINGS Cardiovascular: There is mild calcification of the aortic arch without evidence of aneurysmal dilatation. Normal heart size. No pericardial effusion. Mediastinum/Nodes: No enlarged  mediastinal, hilar, or axillary lymph nodes. Thyroid gland, trachea, and esophagus demonstrate no significant findings. Lungs/Pleura: Mild to moderate severity atelectasis and/or infiltrate is seen within the superior segment of the right lower lobe and posterior aspect of the right lung base. This is decreased in severity when compared to the prior study. A trace amount of right pleural effusion is noted which is also decreased in size when compared to the prior exam. No pneumothorax is identified. Musculoskeletal: A metallic density fusion plate and screws are seen along the anterior aspect of the lower cervical spine. Multilevel degenerative changes seen throughout the thoracic spine. CT ABDOMEN PELVIS FINDINGS Hepatobiliary: Multiple heterogeneous low-attenuation liver lesions are seen. The largest measures approximately 3.6 cm x 3.7 cm and is located within the posterior aspect of the right lobe of the liver. These areas are increased in size and number when compared to the prior study. Status post cholecystectomy. No biliary dilatation. Pancreas: Unremarkable. No pancreatic ductal dilatation or surrounding inflammatory changes. Spleen: Normal in size without focal abnormality. Adrenals/Urinary Tract: Adrenal glands are unremarkable. Kidneys are normal, without renal calculi, focal lesion, or hydronephrosis. A small amount of air is seen within the lumen of an otherwise normal appearing urinary bladder. Stomach/Bowel: Surgical sutures are seen within the gastric region. The appendix is not clearly identified. No evidence of bowel wall thickening, distention, or inflammatory changes. Vascular/Lymphatic: Aortic atherosclerosis. No enlarged abdominal or pelvic lymph nodes. Reproductive: Prostate is unremarkable. Other: No abdominal wall hernia or abnormality. No abdominopelvic ascites. Musculoskeletal: A metallic density intramedullary rod and compression screw device are seen within the proximal right femur.  Multilevel degenerative changes seen throughout the lumbar spine. IMPRESSION: 1. Low-attenuation liver lesions which may represent multiple hepatic abscesses. Sequelae associated with metastatic disease cannot be excluded. 2. Mild to moderate severity right lower lobe atelectasis and/or infiltrate, decreased in severity when compared to the prior study. 3. Trace right pleural effusion. 4. Evidence of prior cholecystectomy. Electronically Signed   By: Virgina Norfolk M.D.   On: 12/31/2020 15:52    Procedures Procedures   Medications Ordered in ED Medications  vancomycin (VANCOREADY) IVPB 1500 mg/300 mL (1,500 mg Intravenous New Bag/Given 12/31/20 1437)  sodium chloride 0.9 % bolus 500 mL (0 mLs Intravenous Stopped 12/31/20 1421)  piperacillin-tazobactam (ZOSYN) IVPB 3.375 g (0 g Intravenous Stopped 12/31/20 1421)  potassium chloride 10 mEq in 100 mL IVPB (0 mEq Intravenous Stopped 12/31/20 1604)    ED Course  I have reviewed the triage vital signs and the nursing notes.  Pertinent labs & imaging results that were available during my care of the patient were reviewed by me and considered in my medical decision making (see chart for details).  Clinical Course as of 12/31/20 1619  Fri Dec 31, 2020  1212 Lactic Acid, Venous(!!): 3.2 [CG]  1213 WBC(!): 16.3 [CG]  1236 Hemoglobin(!): 11.1 stable [CG]  1237 Creatinine(!): 0.55 [CG]  1237 Albumin(!): 2.4 [CG]  1237 AST(!): 229 [CG]  1237 ALT(!): 227 [CG]  1237 Alkaline Phosphatase(!): 1,494 [CG]  1237 Total Bilirubin(!): 3.7 [CG]  1237 BUN(!): 27 [CG]  1237 DG Chest 1 View IMPRESSION: Similar versus mildly improved opacities in the right midlung, likely relating to pleural fluid/empyema and overlying atelectasis or pneumonia, better characterized on prior CT chest. A repeat CT chest with contrast could allow for more sensitive evaluation for change if clinically indicated. [CG]  1417 Re-evaluated patient. Has urinated, will send. Updated wife at  bedside.  [CG]  1540 Chalmers Guest): MODERATE [CG]  1540 RBC / HPF(!): >50 [CG]  1540 WBC, UA(!): >50 [CG]  1540 Bacteria, UA: NONE SEEN [CG]    Clinical Course User Index [CG] Arlean Hopping   MDM Rules/Calculators/A&P                           65 y.o. yo male presents to the ED for elevated WBC 22.9K at rehab facility  Additional information obtained from chart, nursing and triage notes review  Chart review reveals - recent prolonged hospitalizations March 2022 for right fracture s/p IM nail complicated by E.coli UTI, strep anginosis bacteremia and April 2022 for severe sepsis 2/2 liver abscesses s/p JP drain and right empyema, SDH requiring burr holes.  Ordered lab, imaging were personally reviewed and interpreted  Labs reveal - K 2.7, BUN 27, creatinine 0.55, markedly elevated AST, ALT, alk phose but similar to previous. Total bili 3.7 from 1.4.  UA with small bilirubin, mod leukocytes, >50 RBC and WBC but no bacteria seen. Ammonia 44. WBC 16.3 and lactic acid 3.2.   Imaging reveals - CT head stable with no new hemorrhage. CXR shows improved right mid lung opacities. CT CAP shows multiple likely abscess in right posterior liver, increased in size and number from previous study.    Medicines ordered - IV K, zosyn, vancomycin, 500 ml NS for elevation in lactic acid. HD stable and normal.   ED course & MDM 1620: patient re-evaluated. HD stable, vitals remain normal.  Updated wife at bedside.  Pending medicine consult for admission.  Discussed with EDP.   1525: Spoke to Dr Posey Pronto who will admit patient.   Portions of this note were generated with [employer]. Dictation errors may occur despite best attempts at proofreading   Final Clinical Impression(s) / ED Diagnoses Final diagnoses:  Hypokalemia  Liver abscess    Rx / DC Orders ED Discharge Orders    None       Arlean Hopping 12/31/20 1624    Luna Fuse, MD 01/01/21 1026

## 2020-12-31 NOTE — Progress Notes (Signed)
A consult was received from an ED physician for vancomycin & zosyn per pharmacy dosing.  The patient's profile has been reviewed for ht/wt/allergies/indication/available labs.   A one time order has been placed for vancomycin 1500 mg & zosyn 3.375 gm.    Further antibiotics/pharmacy consults should be ordered by admitting physician if indicated.                       Thank you, Eudelia Bunch, Pharm.D 12/31/2020 12:19 PM

## 2020-12-31 NOTE — H&P (Signed)
[facility] History and Physical   Patient: Colton Bush TML:[id number]   PCP: [name], MD DOB: October 04, 1955   DOA: 12/31/2020   DOS: 12/31/2020   DOS: the patient was seen and examined on 12/31/2020  Patient coming from: The patient is coming from SNF  Chief Complaint: Abdominal pain, nausea vomiting as well as abnormal lab  HPI: Colton Bush is a 65 y.o. male with Past medical history of past medical history of HTN, HLD, recurrent pancreatic cancer, recent empyema as well as hepatic abscess. Patient presents with complaints of abdominal pain nausea vomiting and abnormal lab. Patient was recently hospitalized and discharged on 5/24 to [place]. Patient had progressive lethargy and abdominal pain there. On started having nausea. Labs were checked and patient found to have leukocytosis and therefore skewers referred here. At the time of my evaluation patient denied having any fever or chills no chest pain.  Reports right-sided abdominal pain.  No vomiting on arrival.  Having a bowel movement without any diarrhea or constipation.  Reports fatigue and tiredness.  ED Course: CT head was performed due to lethargy unremarkable.  CT chest and abdomen were performed.  No prior history.  CT chest shows improvement in empyema.  CT abdomen shows recurrence and worsening of hepatic abscess.  Patient was referred for admission   Review of Systems: as mentioned in the history of present illness.  All other systems reviewed and are negative.  Past Medical History:  Diagnosis Date  . Arthritis    left hand  . Bronchitis 1977  . Cancer ([place]) 03/09/2016   pancreatic cancer, sees Dr. Cristino Martes at [facility]   . Depression    takes Cymbalta daily  . Diabetes mellitus type II    sees Dr. Chalmers Cater   . GERD (gastroesophageal reflux disease)    takes Omeprazole daily  . H/O hiatal hernia   . Hyperlipidemia    takes Zocor daily  . Hypertension    takes Amlodipine daily  . Hypoglycemia  06/18/2017  . Neck pain    C4-7 stenosis and herniated disc  . Neuromuscular disorder ([place])    hiatal hernia  . Scoliosis    slight  . Spinal cord injury, C5-C7 ([place])    c4-c7  . Stiffness of hand joint    d/t cervical issues   Past Surgical History:  Procedure Laterality Date  . ANTERIOR CERVICAL DECOMP/DISCECTOMY FUSION  08/18/2011   Procedure: ANTERIOR CERVICAL DECOMPRESSION/DISCECTOMY FUSION 3 LEVELS;  Surgeon: Winfield Cunas, MD;  Location: [place] [facility];  Service: Neurosurgery;  Laterality: N/A;  Anterior Cervical Four-Five/Five-Six/Six-Seven Decompression with Fusion, Plating, and Bonegraft  . BURR HOLE Left 11/09/2020   Procedure: LEFT BURR HOLES FOR EVACUATION OF Subdural Hematoma;  Surgeon: Judith Part, MD;  Location: [place];  Service: Neurosurgery;  Laterality: Left;  . CARPAL TUNNEL RELEASE  2013   bilateral, per Dr. Christella Noa   . COLONOSCOPY  10-30-14   per Dr. Olevia Perches, clear, repeat in 10 yrs   . egd with esophageal dilation  9-08   per Dr. Olevia Perches  . ERCP N/A 03/01/2016   Procedure: ENDOSCOPIC RETROGRADE CHOLANGIOPANCREATOGRAPHY (ERCP) with brushings and stent;  Surgeon: Doran Stabler, MD;  Location: [facility];  Service: Endoscopy;  Laterality: N/A;  . ESOPHAGOGASTRODUODENOSCOPY (EGD) WITH PROPOFOL N/A 05/27/2020   Procedure: ESOPHAGOGASTRODUODENOSCOPY (EGD) WITH PROPOFOL;  Surgeon: Milus Banister, MD;  Location: [facility];  Service: Endoscopy;  Laterality: N/A;  . EUS N/A 03/09/2016   Procedure:  ESOPHAGEAL ENDOSCOPIC ULTRASOUND (EUS) RADIAL;  Surgeon: Milus Banister, MD;  Location: [facility];  Service: Endoscopy;  Laterality: N/A;  . EUS N/A 05/27/2020   Procedure: UPPER ENDOSCOPIC ULTRASOUND (EUS) RADIAL;  Surgeon: Milus Banister, MD;  Location: [facility];  Service: Endoscopy;  Laterality: N/A;  . FEMUR IM NAIL Right 10/25/2020   Procedure: INTRAMEDULLARY (IM) NAIL FEMORAL;  Surgeon: Rod Can, MD;  Location: [facility];  Service: Orthopedics;   Laterality: Right;  . IR THORACENTESIS ASP PLEURAL SPACE W/IMG GUIDE  11/19/2020  . lymph nodes biopsy    . melanoma rt calf  1999  . PORT-A-CATH REMOVAL N/A 04/03/2019   Procedure: PORT REMOVAL;  Surgeon: Stark Klein, MD;  Location: [place];  Service: General;  Laterality: N/A;  . PORTACATH PLACEMENT Left 03/22/2016   Procedure: INSERTION PORT-A-CATH;  Surgeon: Stark Klein, MD;  Location: [facility];  Service: General;  Laterality: Left;  . SPINE SURGERY    . TONSILLECTOMY     as a child  . ULNAR TUNNEL RELEASE  2013   right arm, per Dr. Christella Noa   . UPPER GASTROINTESTINAL ENDOSCOPY    . WHIPPLE PROCEDURE N/A 09/19/2016   Procedure: DIAGNOSTIC LAPAROSCOPY, LAPAROSCOPIC LIVER BIOPSY, RETROPERITONEAL EXPLORATION, INTRAOPERATIVE ULTRASOUND;  Surgeon: Stark Klein, MD;  Location: [place];  Service: General;  Laterality: N/A;   Social History:  reports that he has never smoked. He has never used smokeless tobacco. He reports that he does not drink alcohol and does not use drugs.  No Known Allergies  Family history reviewed and not pertinent Family History  Problem Relation Age of Onset  . Heart disease Father   . Heart disease Brother 61  . Anesthesia problems Mother   . Heart disease Mother   . Dementia Mother   . Diabetes Sister   . Stroke Sister   . Colon cancer Neg Hx   . Rectal cancer Neg Hx   . Stomach cancer Neg Hx      Prior to Admission medications   Medication Sig Start Date End Date Taking? Authorizing Provider  acetaminophen (TYLENOL) 325 MG tablet Take 1-2 tablets (325-650 mg total) by mouth every 4 (four) hours as needed for mild pain. Patient taking differently: Take 650 mg by mouth every 4 (four) hours as needed for mild pain. 12/08/20  Yes Angiulli, Lavon Paganini, PA-C  ascorbic acid (VITAMIN C) 500 MG tablet Take 1 tablet (500 mg total) by mouth daily. 12/08/20  Yes Angiulli, Lavon Paganini, PA-C  citalopram (CELEXA) 40 MG tablet Take 1 tablet (40 mg total) by mouth daily. 12/21/20   Yes Dessa Phi, DO  diclofenac Sodium (VOLTAREN) 1 % GEL Apply 2 g topically 4 (four) times daily. To right knee 12/08/20  Yes Angiulli, Lavon Paganini, PA-C  esomeprazole (NEXIUM) 20 MG capsule Take 20 mg by mouth daily.   Yes [provider]  feeding supplement, GLUCERNA SHAKE, (GLUCERNA SHAKE) LIQD Take 237 mLs by mouth 2 (two) times daily between meals.   Yes [provider]  hydrocerin (EUCERIN) CREA Apply 1 application topically 2 (two) times daily. To legs and back 12/08/20  Yes Angiulli, Lavon Paganini, PA-C  HYDROcodone-acetaminophen (NORCO/VICODIN) 5-325 MG tablet Take 1 tablet by mouth every 3 (three) hours as needed for severe pain. 12/21/20  Yes Dessa Phi, DO  hydrocortisone cream 1 % Apply topically 2 (two) times daily. To back/areas of itching 12/08/20  Yes Angiulli, Lavon Paganini, PA-C  insulin aspart (NOVOLOG FLEXPEN) 100 UNIT/ML FlexPen Inject 2-13 Units into  the skin 3 (three) times daily with meals. Sliding Scale insulin for blood sugars from 101-150 - 2 units. For blood sugars from 151-200 - 3 units. For blood sugars from 201-250 - 5 units. For blood sugars from 301-350 use 7 units. Call MD if blood sugars are over 300. Patient taking differently: Inject 2-9 Units into the skin 3 (three) times daily with meals. Sliding Scale insulin for blood sugars from 101-150 - 2 units. For blood sugars from 151-200 - 3 units. For blood sugars from 201-250 - 5 units. For blood sugars from 301-350 use 7 units. Call MD if blood sugars are over 350 12/08/20  Yes Becker, Ivan Anchors, PA-C  insulin detemir (LEVEMIR) 100 UNIT/ML FlexPen Inject 14 Units into the skin daily. Patient taking differently: Inject 14 Units into the skin at bedtime. 12/21/20  Yes Dessa Phi, DO  levETIRAcetam (KEPPRA) 1000 MG tablet Take 1 tablet (1,000 mg total) by mouth 2 (two) times daily. 12/08/20  Yes Angiulli, Lavon Paganini, PA-C  lidocaine (LIDODERM) 5 % Place 1 patch onto the skin daily. Apply to lower back at 8 pm and  remove at 8 am daily 12/08/20  Yes Angiulli, Lavon Paganini, PA-C  linezolid (ZYVOX) 600 MG tablet Take 1 tablet (600 mg total) by mouth every 12 (twelve) hours for 22 days. 12/21/20 01/11/2021 Yes Dessa Phi, DO  lipase/protease/amylase 24000-76000 units CPEP Take 1 capsule (24,000 Units total) by mouth 3 (three) times daily before meals. 12/08/20  Yes Angiulli, Lavon Paganini, PA-C  loperamide (IMODIUM) 2 MG capsule Take 1 capsule (2 mg total) by mouth daily as needed for diarrhea or loose stools. 12/28/20  Yes Carlisle Cater, PA-C  loratadine (CLARITIN) 10 MG tablet Take 1 tablet (10 mg total) by mouth daily. 12/08/20  Yes Angiulli, Lavon Paganini, PA-C  midodrine (PROAMATINE) 5 MG tablet Take 1 tablet (5 mg total) by mouth 3 (three) times daily with meals. 12/08/20  Yes Angiulli, Lavon Paganini, PA-C  Multiple Vitamin (MULTIVITAMIN WITH MINERALS) TABS tablet Take 1 tablet by mouth daily.   Yes [provider]  omeprazole (PRILOSEC) 20 MG capsule TAKE 1 CAPSULE DAILY Patient taking differently: Take 20 mg by mouth daily. 11/17/20  Yes [name], MD  ondansetron (ZOFRAN-ODT) 4 MG disintegrating tablet Take 1 tablet (4 mg total) by mouth every 6 (six) hours as needed for nausea or vomiting (can alternate with compazine). 12/08/20  Yes Angiulli, Lavon Paganini, PA-C  polycarbophil (FIBERCON) 625 MG tablet Take 1 tablet (625 mg total) by mouth 2 (two) times daily. 12/08/20  Yes Angiulli, Lavon Paganini, PA-C  polyethylene glycol (MIRALAX / GLYCOLAX) 17 g packet Take 17 g by mouth daily as needed for mild constipation. 11/12/20  Yes Debbe Odea, MD  simethicone (MYLICON) 80 MG chewable tablet Chew 1 tablet (80 mg total) by mouth 4 (four) times daily -  with meals and at bedtime. 12/08/20  Yes Angiulli, Lavon Paganini, PA-C  tamsulosin (FLOMAX) 0.4 MG CAPS capsule Take 2 capsules (0.8 mg total) by mouth daily. 12/08/20  Yes Angiulli, Lavon Paganini, PA-C  zinc sulfate 220 (50 Zn) MG capsule Take 1 capsule (220 mg total) by mouth daily. 12/08/20   Yes Angiulli, Lavon Paganini, PA-C  Insulin Pen Needle (PEN NEEDLES) 32G X 6 MM MISC 1 application by Does not apply route as needed. 12/08/20   Bary Leriche, PA-C    Physical Exam: Vitals:   12/31/20 1430 12/31/20 1530 12/31/20 1600 12/31/20 1630  BP: 138/73 138/69 (!) 149/80 (!) 160/76  Pulse: 94 92 97 (!) 107  Resp: 17 16 17 19   Temp:    99.2 F (37.3 C)  TempSrc:    Oral  SpO2: 96% 96% 95% 97%  Weight:      Height:        General: alert and oriented to time, place, and person. Appear in marked distress, affect flat in affect Eyes: PERRL, Conjunctiva normal ENT: Oral Mucosa Clear, dry  Neck: no JVD, no Abnormal Mass Or lumps Cardiovascular: S1 and S2 Present, aortic systolic  Murmur, Respiratory: increased respiratory effort, Bilateral Air entry equal and Decreased, no signs of accessory muscle use, faint basal crackles, no wheezes Abdomen: Bowel Sound present, Soft and right-sided tenderness, no hernia Skin: no rashes  Extremities: no Pedal edema, no calf tenderness Neurologic: without any new focal findings Gait not checked due to patient safety concerns  Data Reviewed: I have personally reviewed and interpreted labs, imaging as discussed below.  CBC: Recent Labs  Lab 12/28/20 0621 12/31/20 1128  WBC 7.0 16.3*  NEUTROABS  --  15.5*  HGB 11.5* 11.1*  HCT 34.8* 33.6*  MCV 90.4 89.6  PLT 286 83*   Basic Metabolic Panel: Recent Labs  Lab 12/28/20 0621 12/31/20 1128 12/31/20 1242  NA 134* 138  --   K 4.4 2.7*  --   CL 101 104  --   CO2 25 25  --   GLUCOSE 259* 115*  --   BUN 11 27*  --   CREATININE 0.57* 0.55*  --   CALCIUM 8.7* 8.6*  --   MG 2.0  --  2.0   GFR: Estimated Creatinine Clearance: 88.7 mL/min (A) (by C-G formula based on SCr of 0.55 mg/dL (L)). Liver Function Tests: Recent Labs  Lab 12/28/20 0621 12/31/20 1128  AST 87* 229*  ALT 145* 227*  ALKPHOS 2,516* 1,494*  BILITOT 1.4* 3.7*  PROT 6.7 6.1*  ALBUMIN 2.8* 2.4*   Recent Labs  Lab  12/28/20 0621 12/31/20 1128  LIPASE 15 16   Recent Labs  Lab 12/31/20 1304  AMMONIA 44*   Coagulation Profile: No results for input(s): INR, PROTIME in the last 168 hours. Cardiac Enzymes: No results for input(s): CKTOTAL, CKMB, CKMBINDEX, TROPONINI in the last 168 hours. BNP (last 3 results) No results for input(s): PROBNP in the last 8760 hours. HbA1C: No results for input(s): HGBA1C in the last 72 hours. CBG: Recent Labs  Lab 12/31/20 1748  GLUCAP 109*   Lipid Profile: No results for input(s): CHOL, HDL, LDLCALC, TRIG, CHOLHDL, LDLDIRECT in the last 72 hours. Thyroid Function Tests: No results for input(s): TSH, T4TOTAL, FREET4, T3FREE, THYROIDAB in the last 72 hours. Anemia Panel: No results for input(s): VITAMINB12, FOLATE, FERRITIN, TIBC, IRON, RETICCTPCT in the last 72 hours. Urine analysis:    Component Value Date/Time   COLORURINE AMBER (A) 12/31/2020 1432   APPEARANCEUR HAZY (A) 12/31/2020 1432   LABSPEC 1.027 12/31/2020 1432   PHURINE 5.0 12/31/2020 1432   GLUCOSEU 150 (A) 12/31/2020 1432   HGBUR MODERATE (A) 12/31/2020 1432   BILIRUBINUR SMALL (A) 12/31/2020 1432   BILIRUBINUR 1+ 02/23/2016 1256   KETONESUR NEGATIVE 12/31/2020 1432   PROTEINUR 100 (A) 12/31/2020 1432   UROBILINOGEN 1.0 02/23/2016 1256   NITRITE NEGATIVE 12/31/2020 1432   LEUKOCYTESUR MODERATE (A) 12/31/2020 1432    Radiological Exams on Admission: CT ABDOMEN PELVIS WO CONTRAST  Result Date: 12/31/2020 CLINICAL DATA:  Abnormal labs and lethargy. EXAM: CT CHEST, ABDOMEN AND PELVIS WITHOUT CONTRAST TECHNIQUE: Multidetector  CT imaging of the chest, abdomen and pelvis was performed following the standard protocol without IV contrast. COMPARISON:  None. FINDINGS: CT CHEST FINDINGS Cardiovascular: There is mild calcification of the aortic arch without evidence of aneurysmal dilatation. Normal heart size. No pericardial effusion. Mediastinum/Nodes: No enlarged mediastinal, hilar, or axillary  lymph nodes. Thyroid gland, trachea, and esophagus demonstrate no significant findings. Lungs/Pleura: Mild to moderate severity atelectasis and/or infiltrate is seen within the superior segment of the right lower lobe and posterior aspect of the right lung base. This is decreased in severity when compared to the prior study. A trace amount of right pleural effusion is noted which is also decreased in size when compared to the prior exam. No pneumothorax is identified. Musculoskeletal: A metallic density fusion plate and screws are seen along the anterior aspect of the lower cervical spine. Multilevel degenerative changes seen throughout the thoracic spine. CT ABDOMEN PELVIS FINDINGS Hepatobiliary: Multiple heterogeneous low-attenuation liver lesions are seen. The largest measures approximately 3.6 cm x 3.7 cm and is located within the posterior aspect of the right lobe of the liver. These areas are increased in size and number when compared to the prior study. Status post cholecystectomy. No biliary dilatation. Pancreas: Unremarkable. No pancreatic ductal dilatation or surrounding inflammatory changes. Spleen: Normal in size without focal abnormality. Adrenals/Urinary Tract: Adrenal glands are unremarkable. Kidneys are normal, without renal calculi, focal lesion, or hydronephrosis. A small amount of air is seen within the lumen of an otherwise normal appearing urinary bladder. Stomach/Bowel: Surgical sutures are seen within the gastric region. The appendix is not clearly identified. No evidence of bowel wall thickening, distention, or inflammatory changes. Vascular/Lymphatic: Aortic atherosclerosis. No enlarged abdominal or pelvic lymph nodes. Reproductive: Prostate is unremarkable. Other: No abdominal wall hernia or abnormality. No abdominopelvic ascites. Musculoskeletal: A metallic density intramedullary rod and compression screw device are seen within the proximal right femur. Multilevel degenerative changes seen  throughout the lumbar spine. IMPRESSION: 1. Low-attenuation liver lesions which may represent multiple hepatic abscesses. Sequelae associated with metastatic disease cannot be excluded. 2. Mild to moderate severity right lower lobe atelectasis and/or infiltrate, decreased in severity when compared to the prior study. 3. Trace right pleural effusion. 4. Evidence of prior cholecystectomy. Electronically Signed   By: Virgina Norfolk M.D.   On: 12/31/2020 15:52   DG Chest 1 View  Result Date: 12/31/2020 CLINICAL DATA:  Leukocytosis. EXAM: CHEST  1 VIEW COMPARISON:  Dec 16, 1918 22. May 18 chest radiograph. May 19 chest CT. FINDINGS: Similar versus mildly improved opacities in the right midlung, likely relating to pleural fluid/empyema and overlying atelectasis or pneumonia, better characterized on prior CT chest. No new consolidation. No visible pneumothorax on this limited AP supine radiograph. Similar elevation right hemidiaphragm. Similar cardiomediastinal silhouette. Partially imaged cervical fusion hardware. IMPRESSION: Similar versus mildly improved opacities in the right midlung, likely relating to pleural fluid/empyema and overlying atelectasis or pneumonia, better characterized on prior CT chest. A repeat CT chest with contrast could allow for more sensitive evaluation for change if clinically indicated. Electronically Signed   By: Margaretha Sheffield MD   On: 12/31/2020 12:29   CT Head Wo Contrast  Result Date: 12/31/2020 CLINICAL DATA:  Confusion.  History of pancreatic cancer EXAM: CT HEAD WITHOUT CONTRAST TECHNIQUE: Contiguous axial images were obtained from the base of the skull through the vertex without intravenous contrast. COMPARISON:  CT head 12/13/2020 FINDINGS: Brain: Mild ventricular enlargement unchanged likely due to atrophy. Mild periventricular white matter hypodensity bilaterally. Mild  dural thickening or isodense fluid collection left frontal region measuring 3 mm unchanged from the  prior study. No acute infarct or mass. Vascular: Negative for hyperdense vessel Skull: Left frontal burr hole.  No acute abnormality. Sinuses/Orbits: Negative Other: None IMPRESSION: 3 mm thickening of the left frontal dura versus isodense fluid collection, unchanged from the prior study. No new hemorrhage. Electronically Signed   By: Franchot Gallo M.D.   On: 12/31/2020 12:56   CT Chest Wo Contrast  Result Date: 12/31/2020 CLINICAL DATA:  Abnormal labs and lethargy. EXAM: CT CHEST, ABDOMEN AND PELVIS WITHOUT CONTRAST TECHNIQUE: Multidetector CT imaging of the chest, abdomen and pelvis was performed following the standard protocol without IV contrast. COMPARISON:  Dec 16, 2020 FINDINGS: CT CHEST FINDINGS Cardiovascular: There is mild calcification of the aortic arch without evidence of aneurysmal dilatation. Normal heart size. No pericardial effusion. Mediastinum/Nodes: No enlarged mediastinal, hilar, or axillary lymph nodes. Thyroid gland, trachea, and esophagus demonstrate no significant findings. Lungs/Pleura: Mild to moderate severity atelectasis and/or infiltrate is seen within the superior segment of the right lower lobe and posterior aspect of the right lung base. This is decreased in severity when compared to the prior study. A trace amount of right pleural effusion is noted which is also decreased in size when compared to the prior exam. No pneumothorax is identified. Musculoskeletal: A metallic density fusion plate and screws are seen along the anterior aspect of the lower cervical spine. Multilevel degenerative changes seen throughout the thoracic spine. CT ABDOMEN PELVIS FINDINGS Hepatobiliary: Multiple heterogeneous low-attenuation liver lesions are seen. The largest measures approximately 3.6 cm x 3.7 cm and is located within the posterior aspect of the right lobe of the liver. These areas are increased in size and number when compared to the prior study. Status post cholecystectomy. No biliary  dilatation. Pancreas: Unremarkable. No pancreatic ductal dilatation or surrounding inflammatory changes. Spleen: Normal in size without focal abnormality. Adrenals/Urinary Tract: Adrenal glands are unremarkable. Kidneys are normal, without renal calculi, focal lesion, or hydronephrosis. A small amount of air is seen within the lumen of an otherwise normal appearing urinary bladder. Stomach/Bowel: Surgical sutures are seen within the gastric region. The appendix is not clearly identified. No evidence of bowel wall thickening, distention, or inflammatory changes. Vascular/Lymphatic: Aortic atherosclerosis. No enlarged abdominal or pelvic lymph nodes. Reproductive: Prostate is unremarkable. Other: No abdominal wall hernia or abnormality. No abdominopelvic ascites. Musculoskeletal: A metallic density intramedullary rod and compression screw device are seen within the proximal right femur. Multilevel degenerative changes seen throughout the lumbar spine. IMPRESSION: 1. Low-attenuation liver lesions which may represent multiple hepatic abscesses. Sequelae associated with metastatic disease cannot be excluded. 2. Mild to moderate severity right lower lobe atelectasis and/or infiltrate, decreased in severity when compared to the prior study. 3. Trace right pleural effusion. 4. Evidence of prior cholecystectomy. Electronically Signed   By: Virgina Norfolk M.D.   On: 12/31/2020 15:52   I reviewed all nursing notes, pharmacy notes, vitals, pertinent old records.  Assessment/Plan 1.  Recurrent hepatic abscess. Treated with IV ceftriaxone and Flagyl in the past. Also had IR guided drain placement. CT scan shows evidence of reoccurrence of the abscess as well as increasing numbers. Will consult IR for drainage again. Currently on IV ceftriaxone and Flagyl as well as Zyvox. Monitor response. Concern is with the patient will continue to have recurrent abscesses and infection due to his immunosuppressed status from  pancreatic cancer. Recommend family to consider palliative care and goals of care conversation  for this patient.  2.  Adenocarcinoma of the head of the pancreas. Prior history of Whipple's procedure SP chemo and radiation. Current recurrence is nonresectable. Radiation was planned. Continue Creon.  3. Type 2 Diabetes Mellitus, well controled without  Complication  -On insulin sliding scale sensitive  4.  History of SDH, seizure disorder On Keppra.  We will continue. Required bur hole surgery on 4/12. Monitor.  5.  BPH with chronic indwelling Foley catheter. At risk for recurrent infection. Currently on Flomax. Monitor.  6.  Goals of care conversation. Patient has frequent admissions during the hospitalization requiring multiple antibiotics due to MDRO infection. With his pancreatic cancer history prognosis is guarded regardless of infection status. At present we will consider palliative care consultation. Discussed with wife, currently not ready to have discussion about goals of care and would like to continue full code for now.  7.  Acute thrombocytopenia Likely in the setting of infection. Monitor.  Nutrition: Clear liquid diet.  Nausea complaint DVT Prophylaxis: SCD, pharmacological prophylaxis contraindicated due to Thrombocytopenia  Advance goals of care discussion: Full code   Consults: Palliative care IR  Family Communication: family was present at bedside, at the time of interview.  Opportunity was given to ask question and all questions were answered satisfactorily.   Author: Berle Mull, MD [facility] 12/31/2020 7:43 PM   To reach On-call, see care teams to locate the attending and reach out to them via [email or web address]. If 7PM-7AM, please contact night-coverage If you still have difficulty reaching the attending provider, please page the [facility] (Director on Call) for [facility] on amion for assistance.

## 2021-01-01 DIAGNOSIS — E876 Hypokalemia: Secondary | ICD-10-CM | POA: Diagnosis not present

## 2021-01-01 DIAGNOSIS — C250 Malignant neoplasm of head of pancreas: Secondary | ICD-10-CM

## 2021-01-01 DIAGNOSIS — R7881 Bacteremia: Secondary | ICD-10-CM

## 2021-01-01 DIAGNOSIS — E119 Type 2 diabetes mellitus without complications: Secondary | ICD-10-CM | POA: Diagnosis not present

## 2021-01-01 DIAGNOSIS — D696 Thrombocytopenia, unspecified: Secondary | ICD-10-CM

## 2021-01-01 DIAGNOSIS — B961 Klebsiella pneumoniae [K. pneumoniae] as the cause of diseases classified elsewhere: Secondary | ICD-10-CM

## 2021-01-01 DIAGNOSIS — J869 Pyothorax without fistula: Secondary | ICD-10-CM

## 2021-01-01 DIAGNOSIS — K750 Abscess of liver: Principal | ICD-10-CM

## 2021-01-01 DIAGNOSIS — D72829 Elevated white blood cell count, unspecified: Secondary | ICD-10-CM

## 2021-01-01 LAB — COMPREHENSIVE METABOLIC PANEL
ALT: 168 U/L — ABNORMAL HIGH (ref 0–44)
AST: 94 U/L — ABNORMAL HIGH (ref 15–41)
Albumin: 2.3 g/dL — ABNORMAL LOW (ref 3.5–5.0)
Alkaline Phosphatase: 1209 U/L — ABNORMAL HIGH (ref 38–126)
Anion gap: 13 (ref 5–15)
BUN: 30 mg/dL — ABNORMAL HIGH (ref 8–23)
CO2: 21 mmol/L — ABNORMAL LOW (ref 22–32)
Calcium: 8.3 mg/dL — ABNORMAL LOW (ref 8.9–10.3)
Chloride: 104 mmol/L (ref 98–111)
Creatinine, Ser: 0.59 mg/dL — ABNORMAL LOW (ref 0.61–1.24)
GFR, Estimated: >60 mL/min (ref >60)
Glucose, Bld: 171 mg/dL — ABNORMAL HIGH (ref 70–99)
Potassium: 3 mmol/L — ABNORMAL LOW (ref 3.5–5.1)
Sodium: 138 mmol/L (ref 135–145)
Total Bilirubin: 3.1 mg/dL — ABNORMAL HIGH (ref 0.3–1.2)
Total Protein: 5.8 g/dL — ABNORMAL LOW (ref 6.5–8.1)

## 2021-01-01 LAB — BLOOD CULTURE ID PANEL (REFLEXED) - BCID2

## 2021-01-01 LAB — CBC
HCT: 31.1 % — ABNORMAL LOW (ref 39.0–52.0)
Hemoglobin: 10.4 g/dL — ABNORMAL LOW (ref 13.0–17.0)
MCH: 30.1 pg (ref 26.0–34.0)
MCHC: 33.4 g/dL (ref 30.0–36.0)
MCV: 89.9 fL (ref 80.0–100.0)
Platelets: 39 10*3/uL — ABNORMAL LOW (ref 150–400)
RBC: 3.46 MIL/uL — ABNORMAL LOW (ref 4.22–5.81)
RDW: 21.4 % — ABNORMAL HIGH (ref 11.5–15.5)
WBC: 24.4 10*3/uL — ABNORMAL HIGH (ref 4.0–10.5)
nRBC: 0.1 % (ref 0.0–0.2)

## 2021-01-01 LAB — BASIC METABOLIC PANEL
Anion gap: 8 (ref 5–15)
BUN: 44 mg/dL — ABNORMAL HIGH (ref 8–23)
CO2: 22 mmol/L (ref 22–32)
Calcium: 8 mg/dL — ABNORMAL LOW (ref 8.9–10.3)
Chloride: 105 mmol/L (ref 98–111)
Creatinine, Ser: 0.6 mg/dL — ABNORMAL LOW (ref 0.61–1.24)
GFR, Estimated: >60 mL/min (ref >60)
Glucose, Bld: 424 mg/dL — ABNORMAL HIGH (ref 70–99)
Potassium: 3.9 mmol/L (ref 3.5–5.1)
Sodium: 135 mmol/L (ref 135–145)

## 2021-01-01 LAB — GLUCOSE, CAPILLARY
Glucose-Capillary: 174 mg/dL — ABNORMAL HIGH (ref 70–99)
Glucose-Capillary: 268 mg/dL — ABNORMAL HIGH (ref 70–99)
Glucose-Capillary: 345 mg/dL — ABNORMAL HIGH (ref 70–99)
Glucose-Capillary: 342 mg/dL — ABNORMAL HIGH (ref 70–99)
Glucose-Capillary: 409 mg/dL — ABNORMAL HIGH (ref 70–99)

## 2021-01-01 LAB — PROTIME-INR
INR: 1.6 — ABNORMAL HIGH (ref 0.8–1.2)
Prothrombin Time: 18.6 seconds — ABNORMAL HIGH (ref 11.4–15.2)

## 2021-01-01 MED ORDER — POTASSIUM CHLORIDE 10 MEQ/100ML IV SOLN
10.0000 meq | INTRAVENOUS | Status: AC
  Administered 2021-01-01 (×5): 10 meq via INTRAVENOUS
  Filled 2021-01-01 (×5): qty 100

## 2021-01-01 MED ORDER — METOPROLOL TARTRATE 5 MG/5ML IV SOLN
2.5000 mg | INTRAVENOUS | Status: AC | PRN
  Administered 2021-01-01: 2.5 mg via INTRAVENOUS
  Filled 2021-01-01: qty 5

## 2021-01-01 MED ORDER — INSULIN ASPART 100 UNIT/ML IJ SOLN
0.0000 [IU] | INTRAMUSCULAR | Status: DC
  Administered 2021-01-01: 5 [IU] via SUBCUTANEOUS
  Administered 2021-01-01: 7 [IU] via SUBCUTANEOUS
  Administered 2021-01-01: 9 [IU] via SUBCUTANEOUS
  Administered 2021-01-02: 7 [IU] via SUBCUTANEOUS
  Administered 2021-01-02: 5 [IU] via SUBCUTANEOUS

## 2021-01-01 MED ORDER — MIDODRINE HCL 5 MG PO TABS: 5.0000 mg | ORAL_TABLET | Freq: Two times a day (BID) | ORAL | Status: DC

## 2021-01-01 MED ORDER — LACTATED RINGERS IV BOLUS
1000.0000 mL | Freq: Once | INTRAVENOUS | Status: AC
  Administered 2021-01-01: 1000 mL via INTRAVENOUS

## 2021-01-01 MED ORDER — LEVETIRACETAM IN NACL 1000 MG/100ML IV SOLN
1000.0000 mg | Freq: Two times a day (BID) | INTRAVENOUS | Status: DC
  Administered 2021-01-01 – 2021-01-04 (×8): 1000 mg via INTRAVENOUS
  Filled 2021-01-01 (×9): qty 100

## 2021-01-01 MED ORDER — VITAMIN K1 10 MG/ML IJ SOLN
2.5000 mg | Freq: Once | INTRAVENOUS | Status: AC
  Administered 2021-01-01: 2.5 mg via INTRAVENOUS
  Filled 2021-01-01: qty 0.25

## 2021-01-01 MED ORDER — LINEZOLID 600 MG/300ML IV SOLN
600.0000 mg | Freq: Two times a day (BID) | INTRAVENOUS | Status: DC
  Administered 2021-01-01 – 2021-01-03 (×5): 600 mg via INTRAVENOUS
  Filled 2021-01-01 (×5): qty 300

## 2021-01-01 MED ORDER — MIDODRINE HCL 5 MG PO TABS
5.0000 mg | ORAL_TABLET | Freq: Two times a day (BID) | ORAL | Status: DC
  Administered 2021-01-01 – 2021-01-02 (×3): 5 mg via ORAL
  Filled 2021-01-01 (×4): qty 1

## 2021-01-01 MED ORDER — FAMOTIDINE IN NACL 20-0.9 MG/50ML-% IV SOLN
20.0000 mg | Freq: Every day | INTRAVENOUS | Status: DC
  Administered 2021-01-01 – 2021-01-02 (×2): 20 mg via INTRAVENOUS
  Filled 2021-01-01 (×2): qty 50

## 2021-01-01 NOTE — Evaluation (Signed)
Occupational Therapy Evaluation Patient Details Name: Colton Bush MRN: [id number] DOB: 07/21/1956 Today's Date: 01/01/2021    History of Present Illness 65 y/o male with pancreatic CA admitted with abdominal pain and abnormal labs from [facility].  CT showing hepatic abscess.  Of note, pt with recent admission 5/18 after being found unresponsive at home,  11/05/20 with sepsis s/p burr hole evacuation of L SDH, with CIR admission 11/12/20-12/08/20, recent ED visits 5/12 and 5/16 for weakness. Other PMH includes HTN, hernia, scoliosis, ACDF (2013), R femur fx (09/2020).   Clinical Impression   Mr. Colton Bush is a 65 year old man who presents total with profound weakness, decreased activity tolerance, lethargy resulting in being total assistance for all ADLs including grooming and feeding and only able to roll mobility wise. Patient so weak he fatigued with just talking and his speech so faint he was hard to hear. He did report he wanted to move his legs. Attempted oral care and patient unable to assist - needed verbal cues to clamp on straw to take a sip of water and unable to spit into basin. Patient able to lift right arm but needed active assist for left. Vital signs stable. Patient min assist for transfers a week okay during last admission and able to participate in ADLs and has been at [facility] for rehab. Therapist recommends return to facility to continue rehab at discharge. Per RN patient's wife states she will take him home at discharge. Patient will benefit from skilled OT services while in hospital to improve deficits and learn compensatory strategies as needed in order to return PLOF.      Follow Up Recommendations  SNF    Equipment Recommendations  None recommended by OT    Recommendations for Other Services       Precautions / Restrictions Precautions Precautions: Fall;Other (comment) Precaution Comments: skin integrity, monitor vitals Restrictions Weight Bearing  Restrictions: No RLE Weight Bearing: Weight bearing as tolerated      Mobility Bed Mobility Overal bed mobility: Needs Assistance Bed Mobility: Rolling Rolling: Max assist         General bed mobility comments: hand over hand A to grab rail to roll L and MAX A. Too lethargic to attempt sitting EOB.    Transfers                 General transfer comment: Not tested    Balance                                           ADL either performed or assessed with clinical judgement   ADL Overall ADL's : Needs assistance/impaired Eating/Feeding: Total assistance;Bed level   Grooming: Oral care;Wash/dry face;Total assistance;Bed level Grooming Details (indicate cue type and reason): unable to assist with grooming due to weakness Upper Body Bathing: Total assistance;Bed level   Lower Body Bathing: Total assistance;Bed level   Upper Body Dressing : Total assistance;Bed level   Lower Body Dressing: Total assistance;Bed level   Toilet Transfer: Total assistance   Toileting- Clothing Manipulation and Hygiene: Total assistance               Vision Patient Visual Report: No change from baseline Vision Assessment?: No apparent visual deficits     Perception     Praxis      Pertinent Vitals/Pain Pain Assessment: Faces Faces Pain Scale: Hurts a  little bit Pain Location: slight grimacing and resistance with R LE movement Pain Descriptors / Indicators: Guarding Pain Intervention(s): Monitored during session     Hand Dominance Right   Extremity/Trunk Assessment Upper Extremity Assessment Upper Extremity Assessment: Generalized weakness;RUE deficits/detail;LUE deficits/detail RUE Deficits / Details: Grossly functional ROM; 3-/5 shoulder movement otherwise 3/5 strength RUE Coordination: decreased gross motor;decreased fine motor LUE Deficits / Details: 2-/5 shoulder ROM (needed active assist), otherwise grossly 3/5 ROM LUE Coordination: decreased  gross motor;decreased fine motor   Lower Extremity Assessment Lower Extremity Assessment: Defer to PT evaluation RLE Deficits / Details: limited PROM LLE Deficits / Details: able to actively move L LE in limited range   Cervical / Trunk Assessment Cervical / Trunk Assessment: Kyphotic   Communication Communication Communication: Expressive difficulties;Other (comment) (voice very faint and hard to hear - so weak couldn't talk much)   Cognition Arousal/Alertness: Lethargic Behavior During Therapy: Flat affect Overall Cognitive Status: Difficult to assess                                 General Comments: Patien with flat affect and very weak. DIdn't have the strength to speak much.   General Comments       Exercises     Shoulder Instructions      Home Living Family/patient expects to be discharged to:: Unsure Living Arrangements: Spouse/significant other                                      Prior Functioning/Environment    Gait / Transfers Assistance Needed: Patient has been at [facility] reports walking "a little bit" ADL's / Homemaking Assistance Needed: Needed assistance            OT Problem List: Decreased strength;Impaired balance (sitting and/or standing);Decreased cognition;Decreased safety awareness;Decreased activity tolerance;Decreased knowledge of use of DME or AE;Decreased coordination;Decreased range of motion;Impaired UE functional use      OT Treatment/Interventions: Self-care/ADL training;Therapeutic exercise;Patient/family education;Balance training;Therapeutic activities;DME and/or AE instruction    OT Goals(Current goals can be found in the care plan section) Acute Rehab OT Goals OT Goal Formulation: Patient unable to participate in goal setting Time For Goal Achievement: 01/15/21 Potential to Achieve Goals: Fair  OT Frequency: Min 2X/week   Barriers to D/C:            Co-evaluation PT/OT/SLP  Co-Evaluation/Treatment: Yes Reason for Co-Treatment: Complexity of the patient's impairments (multi-system involvement) PT goals addressed during session: Strengthening/ROM OT goals addressed during session: ADL's and self-care      AM-PAC OT "6 Clicks" Daily Activity     Outcome Measure Help from another person eating meals?: Total Help from another person taking care of personal grooming?: Total Help from another person toileting, which includes using toliet, bedpan, or urinal?: Total Help from another person bathing (including washing, rinsing, drying)?: Total Help from another person to put on and taking off regular upper body clothing?: Total Help from another person to put on and taking off regular lower body clothing?: Total 6 Click Score: 6   End of Session Nurse Communication: Mobility status  Activity Tolerance: Patient limited by fatigue;Patient limited by lethargy Patient left: in bed;with call bell/phone within reach  OT Visit Diagnosis: Unsteadiness on feet (R26.81);Other abnormalities of gait and mobility (R26.89);History of falling (Z91.81);Muscle weakness (generalized) (M62.81);Pain;Other symptoms and signs involving cognitive function;Feeding difficulties (  R63.3)                Time: 5831-6742 OT Time Calculation (min): 19 min Charges:  OT General Charges $OT Visit: 1 Visit OT Evaluation $OT Eval Moderate Complexity: 1 Mod  Mete Purdum, OTR/L [place]  Office [phone] Pager: [email or web address] 01/01/2021, 11:43 AM

## 2021-01-01 NOTE — Progress Notes (Signed)
   01/01/21 0420  Assess: MEWS Score  Temp 99.8 F (37.7 C)  BP (!) 145/73  Pulse Rate (!) 120  ECG Heart Rate (!) 120  Resp 20  Level of Consciousness Alert  SpO2 94 %  O2 Device Room Air  Patient Activity (if Appropriate) In bed  Assess: MEWS Score  MEWS Temp 0  MEWS Systolic 0  MEWS Pulse 2  MEWS RR 0  MEWS LOC 0  MEWS Score 2  MEWS Score Color Yellow  Assess: if the MEWS score is Yellow or Red  Were vital signs taken at a resting state? Yes  Focused Assessment Change from prior assessment (see assessment flowsheet)  Does the patient meet 2 or more of the SIRS criteria? No  Early Detection of Sepsis Score *See Row Information* High  MEWS guidelines implemented *See Row Information* Yes  Treat  MEWS Interventions Escalated (See documentation below)  Pain Scale 0-10  Pain Score 0 (Pt denied pain.)  Take Vital Signs  Increase Vital Sign Frequency  Yellow: Q 2hr X 2 then Q 4hr X 2, if remains yellow, continue Q 4hrs  Escalate  MEWS: Escalate Yellow: discuss with charge nurse/RN and consider discussing with provider and RRT  Notify: Charge Nurse/RN  Name of Charge Nurse/RN Notified Jose, RN  Date Charge Nurse/RN Notified 01/01/21  Time Charge Nurse/RN Notified 0425  Notify: Provider  Provider Name/Title M. Sharlet Salina  Date Provider Notified 01/01/21  Time Provider Notified 0430  Notification Type Page  Notification Reason Other (Comment) (Yellow MEWS)  Provider response Other (Comment) (waiting for response.)  Document  Patient Outcome Not stable and remains on department  Progress note created (see row info) Yes

## 2021-01-01 NOTE — Consult Note (Signed)
[facility]    Date of Admission:  12/31/2020     Reason for Consult: Klebsiella bacteremia, empyema, liver abscess     Referring Physician: Dr Posey Pronto  Current antibiotics: Ceftriaxone 12/31/20--present Metronidazole 12/31/20-- present Linezolid 5/19-- present  ASSESSMENT:    1. Klebsiella pneumoniae bacteremia: In the setting of recurrent hepatic abscesses.  Currently on ceftriaxone and metronidazole with possible plans for IR aspiration/drainage. 2. Empyema: Secondary to VRE currently on linezolid with radiographic improvement. 3. Severe sepsis: Presenting with nausea, vomiting, lactic acidosis, leukocytosis.  Secondary to #1 4. Thrombocytopenia: Likely related to acute infection 5. Recurrent pancreatic cancer: Status post Whipple procedure plus chemoradiation.  Plan is for continued radiation once he has been medically more stable if he makes it to that point. 6. Type 2 diabetes 7. Hypokalemia  PLAN:    . Continue ceftriaxone and metronidazole for hepatic abscess.  Appreciate IR assistance for possible aspiration/drainage . Continue linezolid for empyema . Continues to be high risk for recurrent infection in the setting of pancreatic cancer and net state of immunosuppression . Agree with goals of care . Monitor CBC for leukocytosis and thrombocytopenia.  Electrolyte replacement per primary. . Follow-up cultures . Glycemic control per primary   Principal Problem:   Bacteremia due to Klebsiella pneumoniae Active Problems:   Diabetes ([name])   Adenocarcinoma of head of pancreas (HCC)   Hypokalemia   Hepatic abscess   Leukocytosis   Thrombocytopenia (HCC)   MEDICATIONS:    Scheduled Meds: . insulin aspart  0-9 Units Subcutaneous Q4H  . lipase/protease/amylase  24,000 Units Oral TID AC  . midodrine  5 mg Oral BID WC   Continuous Infusions: . cefTRIAXone (ROCEPHIN)  IV 2 g (12/31/20 2051)  . famotidine (PEPCID) IV 20 mg (01/01/21 1307)  .  lactated ringers 125 mL/hr at 01/01/21 1059  . levETIRAcetam 1,000 mg (01/01/21 1159)  . linezolid (ZYVOX) IV 600 mg (01/01/21 0836)  . metronidazole 500 mg (01/01/21 1316)   PRN Meds:.HYDROmorphone (DILAUDID) injection, ondansetron **OR** ondansetron (ZOFRAN) IV, oxyCODONE  HPI:    Colton Bush is a 65 y.o. male with a past medical history significant for hypertension, hyperlipidemia, recurrent pancreatic cancer, recent polymicrobial hepatic abscess (in the setting of strep bacteremia, TEE negative) and VRE empyema.  Recently hospitalized at [facility] and discharged to skilled nursing on 5/25.  Now readmitted on 6/3 after presenting with nausea, vomiting, abdominal pain, and leukocytosis noted at his SNF.  Patient has a history of recent polymicrobial liver abscess (E. coli, strep anginosis, Klebsiella pneumonia) status post percutaneous drain placement 4/9 that was subsequently removed on 4/29 with radiographic resolution.  He was continued on ceftriaxone and metronidazole for this resolved liver abscess for an additional several weeks through 12/17/2020 as there had been some concern about questionable infected subdural hematoma.  Additionally, he was found to have a complicated exudative pleural effusion with thoracentesis cultures in April 2022 revealing VRE status post 2 weeks of linezolid.  A repeat CT chest in May 2022 showed continued empyema and he was restarted on linezolid 5/20.  This empyema was not amenable to IR drainage and he was discharged to SNF on linezolid for 4 weeks from 5/19 with a repeat CT chest recommended prior to end of therapy by my partner, Dr. West Bali.  Upon admission yesterday, patient was found to have leukocytosis of 16.3, potassium 2.7, AST 229, ALT 227, alk phos 1494, lactic acid 3.2, and CT scan of the abdomen and pelvis  showing low-attenuation liver lesions which may represent multiple hepatic abscesses with metastatic disease not excluded.  His CT chest showed  atelectasis within the right lower lobe and posterior aspect of the right lung base that was decreased when compared to prior study.  Trace amount of right pleural fluid was noted which was also decreased.  Blood cultures were obtained at admission which are positive for gram-negative rods in 4 out of 4 bottles with BC ID identifying Klebsiella pneumonia.  Patient was seen this afternoon with his wife at the bedside.  She reports that he is more out of it today and appears very tired and fatigued.  He does not contribute much to his history but does endorse fatigue, abdominal pain, and fevers.  IR was consulted for further management of hepatic abscesses and possible drainage procedure.  They report that his recurrent abscesses are smaller compared to before prior drainage and may be difficult to drain.  They will reevaluate in the morning.  He is currently on ceftriaxone and metronidazole.  Linezolid has also been continued.     Past Medical History:  Diagnosis Date  . Arthritis    left hand  . Bronchitis 1977  . Cancer ([place]) 03/09/2016   pancreatic cancer, sees Dr. Cristino Martes at [facility]   . Depression    takes Cymbalta daily  . Diabetes mellitus type II    sees Dr. Chalmers Cater   . GERD (gastroesophageal reflux disease)    takes Omeprazole daily  . H/O hiatal hernia   . Hyperlipidemia    takes Zocor daily  . Hypertension    takes Amlodipine daily  . Hypoglycemia 06/18/2017  . Neck pain    C4-7 stenosis and herniated disc  . Neuromuscular disorder ([place])    hiatal hernia  . Scoliosis    slight  . Spinal cord injury, C5-C7 ([place])    c4-c7  . Stiffness of hand joint    d/t cervical issues    Social History   Tobacco Use  . Smoking status: Never Smoker  . Smokeless tobacco: Never Used  . Tobacco comment: tried a pipe 35 years ago   Vaping Use  . Vaping Use: Never used  Substance Use Topics  . Alcohol use: Bush    Alcohol/week: 0.0 standard drinks  . Drug use: Bush    Family History   Problem Relation Age of Onset  . Heart disease Father   . Heart disease Brother 31  . Anesthesia problems Mother   . Heart disease Mother   . Dementia Mother   . Diabetes Sister   . Stroke Sister   . Colon cancer Neg Hx   . Rectal cancer Neg Hx   . Stomach cancer Neg Hx     Bush Known Allergies  Review of Systems  All other systems reviewed and are negative. except as noted in HPI  OBJECTIVE:   Blood pressure 117/79, pulse 85, temperature 97.7 F (36.5 C), temperature source Oral, resp. rate 18, height _0  (1.803 m), weight 72.2 kg, SpO2 98 %. Body mass index is 22.2 kg/m.  Physical Exam Constitutional:      Comments: Chronically ill appearing, uncomfortable appearing, NAD.  Wife at bedside.   HENT:     Head: Normocephalic and atraumatic.     Comments: Poor dentition Eyes:     General: Bush scleral icterus.    Conjunctiva/sclera: Conjunctivae normal.  Cardiovascular:     Rate and Rhythm: Normal rate and regular rhythm.  Pulmonary:  Effort: Pulmonary effort is normal. Bush respiratory distress.  Abdominal:     General: There is Bush distension.     Palpations: Abdomen is soft.     Tenderness: There is abdominal tenderness.  Musculoskeletal:     Cervical back: Normal range of motion and neck supple.  Skin:    General: Skin is warm and dry.     Coloration: Skin is not jaundiced.  Neurological:     General: Bush focal deficit present.     Mental Status: He is oriented to person, place, and time.  Psychiatric:        Mood and Affect: Mood normal.        Behavior: Behavior normal.      Lab Results: Lab Results  Component Value Date   WBC 24.4 (H) 01/01/2021   HGB 10.4 (L) 01/01/2021   HCT 31.1 (L) 01/01/2021   MCV 89.9 01/01/2021   PLT 39 (L) 01/01/2021    Lab Results  Component Value Date   NA 138 01/01/2021   K 3.0 (L) 01/01/2021   CO2 21 (L) 01/01/2021   GLUCOSE 171 (H) 01/01/2021   BUN 30 (H) 01/01/2021   CREATININE 0.59 (L) 01/01/2021   CALCIUM  8.3 (L) 01/01/2021   GFRNONAA >60 01/01/2021   GFRAA >60 04/14/2020    Lab Results  Component Value Date   ALT 168 (H) 01/01/2021   AST 94 (H) 01/01/2021   ALKPHOS 1,209 (H) 01/01/2021   BILITOT 3.1 (H) 01/01/2021       Component Value Date/Time   CRP 0.6 12/06/2020 0500       Component Value Date/Time   ESRSEDRATE 58 (H) 12/06/2020 0500    I have reviewed the micro and lab results in Epic.  Imaging: CT ABDOMEN PELVIS WO CONTRAST  Result Date: 12/31/2020 CLINICAL DATA:  Abnormal labs and lethargy. EXAM: CT CHEST, ABDOMEN AND PELVIS WITHOUT CONTRAST TECHNIQUE: Multidetector CT imaging of the chest, abdomen and pelvis was performed following the standard protocol without IV contrast. COMPARISON:  None. FINDINGS: CT CHEST FINDINGS Cardiovascular: There is mild calcification of the aortic arch without evidence of aneurysmal dilatation. Normal heart size. Bush pericardial effusion. Mediastinum/Nodes: Bush enlarged mediastinal, hilar, or axillary lymph nodes. Thyroid gland, trachea, and esophagus demonstrate Bush significant findings. Lungs/Pleura: Mild to moderate severity atelectasis and/or infiltrate is seen within the superior segment of the right lower lobe and posterior aspect of the right lung base. This is decreased in severity when compared to the prior study. A trace amount of right pleural effusion is noted which is also decreased in size when compared to the prior exam. Bush pneumothorax is identified. Musculoskeletal: A metallic density fusion plate and screws are seen along the anterior aspect of the lower cervical spine. Multilevel degenerative changes seen throughout the thoracic spine. CT ABDOMEN PELVIS FINDINGS Hepatobiliary: Multiple heterogeneous low-attenuation liver lesions are seen. The largest measures approximately 3.6 cm x 3.7 cm and is located within the posterior aspect of the right lobe of the liver. These areas are increased in size and number when compared to the prior  study. Status post cholecystectomy. Bush biliary dilatation. Pancreas: Unremarkable. Bush pancreatic ductal dilatation or surrounding inflammatory changes. Spleen: Normal in size without focal abnormality. Adrenals/Urinary Tract: Adrenal glands are unremarkable. Kidneys are normal, without renal calculi, focal lesion, or hydronephrosis. A small amount of air is seen within the lumen of an otherwise normal appearing urinary bladder. Stomach/Bowel: Surgical sutures are seen within the gastric region. The appendix is not clearly  identified. Bush evidence of bowel wall thickening, distention, or inflammatory changes. Vascular/Lymphatic: Aortic atherosclerosis. Bush enlarged abdominal or pelvic lymph nodes. Reproductive: Prostate is unremarkable. Other: Bush abdominal wall hernia or abnormality. Bush abdominopelvic ascites. Musculoskeletal: A metallic density intramedullary rod and compression screw device are seen within the proximal right femur. Multilevel degenerative changes seen throughout the lumbar spine. IMPRESSION: 1. Low-attenuation liver lesions which may represent multiple hepatic abscesses. Sequelae associated with metastatic disease cannot be excluded. 2. Mild to moderate severity right lower lobe atelectasis and/or infiltrate, decreased in severity when compared to the prior study. 3. Trace right pleural effusion. 4. Evidence of prior cholecystectomy. Electronically Signed   By: Virgina Norfolk M.D.   On: 12/31/2020 15:52   DG Chest 1 View  Result Date: 12/31/2020 CLINICAL DATA:  Leukocytosis. EXAM: CHEST  1 VIEW COMPARISON:  Dec 16, 1918 22. May 18 chest radiograph. May 19 chest CT. FINDINGS: Similar versus mildly improved opacities in the right midlung, likely relating to pleural fluid/empyema and overlying atelectasis or pneumonia, better characterized on prior CT chest. Bush new consolidation. Bush visible pneumothorax on this limited AP supine radiograph. Similar elevation right hemidiaphragm. Similar  cardiomediastinal silhouette. Partially imaged cervical fusion hardware. IMPRESSION: Similar versus mildly improved opacities in the right midlung, likely relating to pleural fluid/empyema and overlying atelectasis or pneumonia, better characterized on prior CT chest. A repeat CT chest with contrast could allow for more sensitive evaluation for change if clinically indicated. Electronically Signed   By: Margaretha Sheffield MD   On: 12/31/2020 12:29   CT Head Wo Contrast  Result Date: 12/31/2020 CLINICAL DATA:  Confusion.  History of pancreatic cancer EXAM: CT HEAD WITHOUT CONTRAST TECHNIQUE: Contiguous axial images were obtained from the base of the skull through the vertex without intravenous contrast. COMPARISON:  CT head 12/13/2020 FINDINGS: Brain: Mild ventricular enlargement unchanged likely due to atrophy. Mild periventricular white matter hypodensity bilaterally. Mild dural thickening or isodense fluid collection left frontal region measuring 3 mm unchanged from the prior study. Bush acute infarct or mass. Vascular: Negative for hyperdense vessel Skull: Left frontal burr hole.  Bush acute abnormality. Sinuses/Orbits: Negative Other: None IMPRESSION: 3 mm thickening of the left frontal dura versus isodense fluid collection, unchanged from the prior study. Bush new hemorrhage. Electronically Signed   By: Franchot Gallo M.D.   On: 12/31/2020 12:56   CT Chest Wo Contrast  Result Date: 12/31/2020 CLINICAL DATA:  Abnormal labs and lethargy. EXAM: CT CHEST, ABDOMEN AND PELVIS WITHOUT CONTRAST TECHNIQUE: Multidetector CT imaging of the chest, abdomen and pelvis was performed following the standard protocol without IV contrast. COMPARISON:  Dec 16, 2020 FINDINGS: CT CHEST FINDINGS Cardiovascular: There is mild calcification of the aortic arch without evidence of aneurysmal dilatation. Normal heart size. Bush pericardial effusion. Mediastinum/Nodes: Bush enlarged mediastinal, hilar, or axillary lymph nodes. Thyroid gland,  trachea, and esophagus demonstrate Bush significant findings. Lungs/Pleura: Mild to moderate severity atelectasis and/or infiltrate is seen within the superior segment of the right lower lobe and posterior aspect of the right lung base. This is decreased in severity when compared to the prior study. A trace amount of right pleural effusion is noted which is also decreased in size when compared to the prior exam. Bush pneumothorax is identified. Musculoskeletal: A metallic density fusion plate and screws are seen along the anterior aspect of the lower cervical spine. Multilevel degenerative changes seen throughout the thoracic spine. CT ABDOMEN PELVIS FINDINGS Hepatobiliary: Multiple heterogeneous low-attenuation liver lesions are seen. The largest measures  approximately 3.6 cm x 3.7 cm and is located within the posterior aspect of the right lobe of the liver. These areas are increased in size and number when compared to the prior study. Status post cholecystectomy. Bush biliary dilatation. Pancreas: Unremarkable. Bush pancreatic ductal dilatation or surrounding inflammatory changes. Spleen: Normal in size without focal abnormality. Adrenals/Urinary Tract: Adrenal glands are unremarkable. Kidneys are normal, without renal calculi, focal lesion, or hydronephrosis. A small amount of air is seen within the lumen of an otherwise normal appearing urinary bladder. Stomach/Bowel: Surgical sutures are seen within the gastric region. The appendix is not clearly identified. Bush evidence of bowel wall thickening, distention, or inflammatory changes. Vascular/Lymphatic: Aortic atherosclerosis. Bush enlarged abdominal or pelvic lymph nodes. Reproductive: Prostate is unremarkable. Other: Bush abdominal wall hernia or abnormality. Bush abdominopelvic ascites. Musculoskeletal: A metallic density intramedullary rod and compression screw device are seen within the proximal right femur. Multilevel degenerative changes seen throughout the lumbar  spine. IMPRESSION: 1. Low-attenuation liver lesions which may represent multiple hepatic abscesses. Sequelae associated with metastatic disease cannot be excluded. 2. Mild to moderate severity right lower lobe atelectasis and/or infiltrate, decreased in severity when compared to the prior study. 3. Trace right pleural effusion. 4. Evidence of prior cholecystectomy. Electronically Signed   By: Virgina Norfolk M.D.   On: 12/31/2020 15:52     Imaging independently reviewed in Epic.  [facility] [phone] pager 01/01/2021, 4:54 PM

## 2021-01-01 NOTE — Progress Notes (Addendum)
PHARMACY - PHYSICIAN COMMUNICATION CRITICAL VALUE ALERT - BLOOD CULTURE IDENTIFICATION (BCID)  Colton Bush is an 65 y.o. male who presented to [facility] on 12/31/2020 with a chief complaint of weakness and altered mental status.   Assessment:   PMH significant for recurrent pancreatic CA, recent empyema and recurrent hepatic abscess which has previously been treated with Rocephin + Flagyl.  Currently completing 3 week course of linezolid per ID for empyema (thru 6/15).  He is afebrile with leukocytosis.  Renal function WNL.  Both blood cx sets + GNR.  BCID + Klebsiella pneumoniae.   Name of physician (or Provider) Contacted: Ardith Dark  Current antibiotics:  Rocephin 2gm IV q24h Flagyl 500mg  IV q8h Zyvox 600mg  IV q12h  Changes to prescribed antibiotics recommended:  Continue current antibiotic regimen. F/U ID recommendations and cx sensitivities  Results for orders placed or performed during the hospital encounter of 12/31/20  Blood Culture ID Panel (Reflexed) (Collected: 12/31/2020 12:55 PM)  Result Value Ref Range   Enterococcus faecalis NOT DETECTED NOT DETECTED   Enterococcus Faecium NOT DETECTED NOT DETECTED   Listeria monocytogenes NOT DETECTED NOT DETECTED   Staphylococcus species NOT DETECTED NOT DETECTED   Staphylococcus aureus (BCID) NOT DETECTED NOT DETECTED   Staphylococcus epidermidis NOT DETECTED NOT DETECTED   Staphylococcus lugdunensis NOT DETECTED NOT DETECTED   Streptococcus species NOT DETECTED NOT DETECTED   Streptococcus agalactiae NOT DETECTED NOT DETECTED   Streptococcus pneumoniae NOT DETECTED NOT DETECTED   Streptococcus pyogenes NOT DETECTED NOT DETECTED   A.calcoaceticus-baumannii NOT DETECTED NOT DETECTED   Bacteroides fragilis NOT DETECTED NOT DETECTED   Enterobacterales DETECTED (A) NOT DETECTED   Enterobacter cloacae complex NOT DETECTED NOT DETECTED   Escherichia coli NOT DETECTED NOT DETECTED   Klebsiella aerogenes NOT DETECTED NOT DETECTED    Klebsiella oxytoca NOT DETECTED NOT DETECTED   Klebsiella pneumoniae DETECTED (A) NOT DETECTED   Proteus species NOT DETECTED NOT DETECTED   Salmonella species NOT DETECTED NOT DETECTED   Serratia marcescens NOT DETECTED NOT DETECTED   Haemophilus influenzae NOT DETECTED NOT DETECTED   Neisseria meningitidis NOT DETECTED NOT DETECTED   Pseudomonas aeruginosa NOT DETECTED NOT DETECTED   Stenotrophomonas maltophilia NOT DETECTED NOT DETECTED   Candida albicans NOT DETECTED NOT DETECTED   Candida auris NOT DETECTED NOT DETECTED   Candida glabrata NOT DETECTED NOT DETECTED   Candida krusei NOT DETECTED NOT DETECTED   Candida parapsilosis NOT DETECTED NOT DETECTED   Candida tropicalis NOT DETECTED NOT DETECTED   Cryptococcus neoformans/gattii NOT DETECTED NOT DETECTED   CTX-M ESBL NOT DETECTED NOT DETECTED   Carbapenem resistance IMP NOT DETECTED NOT DETECTED   Carbapenem resistance KPC NOT DETECTED NOT DETECTED   Carbapenem resistance NDM NOT DETECTED NOT DETECTED   Carbapenem resist OXA 48 LIKE NOT DETECTED NOT DETECTED   Carbapenem resistance VIM NOT DETECTED NOT DETECTED    Netta Cedars PharmD 01/01/2021  3:33 AM

## 2021-01-01 NOTE — Evaluation (Signed)
Clinical/Bedside Swallow Evaluation Patient Details  Name: Colton Bush MRN: [id number] Date of Birth: May 11, 1956  Today's Date: 01/01/2021 Time: SLP Start Time (ACUTE ONLY): 5681 SLP Stop Time (ACUTE ONLY): 1235 SLP Time Calculation (min) (ACUTE ONLY): 13 min  Past Medical History:  Past Medical History:  Diagnosis Date  . Arthritis    left hand  . Bronchitis 1977  . Cancer ([place]) 03/09/2016   pancreatic cancer, sees Dr. Cristino Martes at [facility]   . Depression    takes Cymbalta daily  . Diabetes mellitus type II    sees Dr. Chalmers Cater   . GERD (gastroesophageal reflux disease)    takes Omeprazole daily  . H/O hiatal hernia   . Hyperlipidemia    takes Zocor daily  . Hypertension    takes Amlodipine daily  . Hypoglycemia 06/18/2017  . Neck pain    C4-7 stenosis and herniated disc  . Neuromuscular disorder ([name])    hiatal hernia  . Scoliosis    slight  . Spinal cord injury, C5-C7 ([place])    c4-c7  . Stiffness of hand joint    d/t cervical issues   Past Surgical History:  Past Surgical History:  Procedure Laterality Date  . ANTERIOR CERVICAL DECOMP/DISCECTOMY FUSION  08/18/2011   Procedure: ANTERIOR CERVICAL DECOMPRESSION/DISCECTOMY FUSION 3 LEVELS;  Surgeon: Winfield Cunas, MD;  Location: [place] [facility];  Service: Neurosurgery;  Laterality: N/A;  Anterior Cervical Four-Five/Five-Six/Six-Seven Decompression with Fusion, Plating, and Bonegraft  . BURR HOLE Left 11/09/2020   Procedure: LEFT BURR HOLES FOR EVACUATION OF Subdural Hematoma;  Surgeon: Judith Part, MD;  Location: [place];  Service: Neurosurgery;  Laterality: Left;  . CARPAL TUNNEL RELEASE  2013   bilateral, per Dr. Christella Noa   . COLONOSCOPY  10-30-14   per Dr. Olevia Perches, clear, repeat in 10 yrs   . egd with esophageal dilation  9-08   per Dr. Olevia Perches  . ERCP N/A 03/01/2016   Procedure: ENDOSCOPIC RETROGRADE CHOLANGIOPANCREATOGRAPHY (ERCP) with brushings and stent;  Surgeon: Doran Stabler, MD;  Location: [facility];   Service: Endoscopy;  Laterality: N/A;  . ESOPHAGOGASTRODUODENOSCOPY (EGD) WITH PROPOFOL N/A 05/27/2020   Procedure: ESOPHAGOGASTRODUODENOSCOPY (EGD) WITH PROPOFOL;  Surgeon: Milus Banister, MD;  Location: [facility];  Service: Endoscopy;  Laterality: N/A;  . EUS N/A 03/09/2016   Procedure: ESOPHAGEAL ENDOSCOPIC ULTRASOUND (EUS) RADIAL;  Surgeon: Milus Banister, MD;  Location: [facility];  Service: Endoscopy;  Laterality: N/A;  . EUS N/A 05/27/2020   Procedure: UPPER ENDOSCOPIC ULTRASOUND (EUS) RADIAL;  Surgeon: Milus Banister, MD;  Location: [facility];  Service: Endoscopy;  Laterality: N/A;  . FEMUR IM NAIL Right 10/25/2020   Procedure: INTRAMEDULLARY (IM) NAIL FEMORAL;  Surgeon: Rod Can, MD;  Location: [facility];  Service: Orthopedics;  Laterality: Right;  . IR THORACENTESIS ASP PLEURAL SPACE W/IMG GUIDE  11/19/2020  . lymph nodes biopsy    . melanoma rt calf  1999  . PORT-A-CATH REMOVAL N/A 04/03/2019   Procedure: PORT REMOVAL;  Surgeon: Stark Klein, MD;  Location: [place];  Service: General;  Laterality: N/A;  . PORTACATH PLACEMENT Left 03/22/2016   Procedure: INSERTION PORT-A-CATH;  Surgeon: Stark Klein, MD;  Location: [facility];  Service: General;  Laterality: Left;  . SPINE SURGERY    . TONSILLECTOMY     as a child  . ULNAR TUNNEL RELEASE  2013   right arm, per Dr. Christella Noa   . UPPER GASTROINTESTINAL ENDOSCOPY    . WHIPPLE PROCEDURE N/A 09/19/2016  Procedure: DIAGNOSTIC LAPAROSCOPY, LAPAROSCOPIC LIVER BIOPSY, RETROPERITONEAL EXPLORATION, INTRAOPERATIVE ULTRASOUND;  Surgeon: Stark Klein, MD;  Location: [place];  Service: General;  Laterality: N/A;   HPI:  65 y/o male with pancreatic CA admitted with abdominal pain and abnormal labs from [facility].  CT showing hepatic abscess.  Of note, pt with recent admission 5/18 after being found unresponsive at home,  11/05/20 with sepsis s/p burr hole evacuation of L SDH, with CIR admission 11/12/20-12/08/20, recent ED visits 5/12 and  5/16 for weakness. Other PMH includes HTN, hernia, scoliosis, ACDF C4-7 (2013), R femur fx (09/2020) .MBS 11/08/20 with recs for thin liquids; eventually advanced to regular solids/thin liquids. During CIR admission, pt was followed by SLP for cognition but not swallowing as dysphagia had resolved by the time he was admitted to inpt rehab.   Assessment / Plan / Recommendation Clinical Impression  Pt with profound weakness and difficulty maintaining wakefulness for participation. He accepted ice chips and sips of water, needing multiple opportunites before he could withdraw water from straw, but eventually being successful. There was notable oral holding followed by a palpable swallow but no s/s of aspiration. He was too lethargic to attempt purees.  Spoke with RN - recommend allowing thin liquids and purees when alert; offer ice chips and sips of water. Raise HOB and assist pt.  SLP will follow briefly for safety/diet progression if possible.  D/W RN. SLP Visit Diagnosis: Dysphagia, oropharyngeal phase (R13.12)    Aspiration Risk    minimal at this time   Diet Recommendation   dysphagia 1, thin liquids  Medication Administration: Crushed with puree    Other  Recommendations Oral Care Recommendations: Oral care BID   Follow up Recommendations None      Frequency and Duration min 1 x/week  1 week       Prognosis Prognosis for Safe Diet Advancement: Guarded      Swallow Study   General HPI: 65 y/o male with pancreatic CA admitted with abdominal pain and abnormal labs from [facility].  CT showing hepatic abscess.  Of note, pt with recent admission 5/18 after being found unresponsive at home,  11/05/20 with sepsis s/p burr hole evacuation of L SDH, with CIR admission 11/12/20-12/08/20, recent ED visits 5/12 and 5/16 for weakness. Other PMH includes HTN, hernia, scoliosis, ACDF C4-7 (2013), R femur fx (09/2020) .MBS 11/08/20 with recs for thin liquids; eventually advanced to regular solids/thin  liquids. During CIR admission, pt was followed by SLP for cognition but not swallowing as dysphagia had resolved by the time he was admitted to inpt rehab. Type of Study: Bedside Swallow Evaluation Previous Swallow Assessment: see HPI Diet Prior to this Study: NPO Temperature Spikes Noted: Yes (99.9) Respiratory Status: Room air History of Recent Intubation: No Behavior/Cognition: Lethargic/Drowsy Oral Cavity Assessment: Dried secretions Oral Care Completed by SLP: Yes Oral Cavity - Dentition: Poor condition Self-Feeding Abilities: Total assist Patient Positioning: Partially reclined Baseline Vocal Quality: Breathy Volitional Cough: Weak Volitional Swallow: Unable to elicit    Oral/Motor/Sensory Function Overall Oral Motor/Sensory Function: Generalized oral weakness   Ice Chips Ice chips: Within functional limits   Thin Liquid Thin Liquid: Within functional limits    Nectar Thick Nectar Thick Liquid: Not tested   Honey Thick Honey Thick Liquid: Not tested   Puree Puree: Not tested   Solid     Solid: Not tested      Juan Quam Laurice 01/01/2021,12:44 PM  Estill Bamberg L. Tivis Ringer, MA CCC/SLP Acute Rehabilitation Services Office number  [phone] Pager [phone]

## 2021-01-01 NOTE — Plan of Care (Signed)
?  Problem: Coping: ?Goal: Level of anxiety will decrease ?Outcome: Progressing ?  ?Problem: Safety: ?Goal: Ability to remain free from injury will improve ?Outcome: Progressing ?  ?

## 2021-01-01 NOTE — Progress Notes (Signed)
IR consulted by Dr. Posey Pronto for management of recurrent hepatic abscesses (possible aspiration/drainage).  Case/images have been reviewed by Dr. Kathlene Cote who states recurrent abscesses are smaller (compared to before prior drainage) and may be more difficult to drain. In addition, INR elevated (1.6). Recommend IV antibiotics, consider Vitamin K, repeat CBC/INR in AM and will re-evaluate at that time. Patient to be NPO at midnight for possible procedure tomorrow (after re-evaluation of above). Dr. Posey Pronto made aware.  Please call IR with questions/concerns.   Bea Graff Artisha Capri, PA-C 01/01/2021, 10:09 AM

## 2021-01-01 NOTE — Evaluation (Signed)
Physical Therapy Evaluation Patient Details Name: Colton Bush MRN: [id number] DOB: 1956-04-07 Today's Date: 01/01/2021   History of Present Illness  65 y/o male with pancreatic CA admitted with abdominal pain and abnormal labs from [facility].  CT showing hepatic abscess.  Of note, pt with recent admission 5/18 after being found unresponsive at home,  11/05/20 with sepsis s/p burr hole evacuation of L SDH, with CIR admission 11/12/20-12/08/20, recent ED visits 5/12 and 5/16 for weakness. Other PMH includes HTN, hernia, scoliosis, ACDF (2013), R femur fx (09/2020).  Clinical Impression  Pt admitted with above diagnosis. Pt currently with functional limitations due to the deficits listed below (see PT Problem List). Pt will benefit from skilled PT to increase their independence and safety with mobility to allow discharge to the venue listed below.  Pt was very weak at evaluation and fatigued with minimal work.  He was positioned with pillows under legs and between knees.  Recommend SNF for continued strengthening.     Follow Up Recommendations SNF    Equipment Recommendations  None recommended by PT    Recommendations for Other Services Speech consult     Precautions / Restrictions Precautions Precautions: Fall;Other (comment) Precaution Comments: skin integrity Restrictions Weight Bearing Restrictions: No      Mobility  Bed Mobility Overal bed mobility: Needs Assistance Bed Mobility: Rolling Rolling: Max assist         General bed mobility comments: hand over hand A to grab rail to roll L and MAX A. Too lethargic to attempt sitting EOB.    Transfers                 General transfer comment: Not tested  Ambulation/Gait             General Gait Details: Not tested  Stairs            Wheelchair Mobility    Modified Rankin (Stroke Patients Only)       Balance                                             Pertinent  Vitals/Pain Pain Assessment: Faces Faces Pain Scale: Hurts a little bit Pain Location: slight grimacing and resistance with R LE movement Pain Descriptors / Indicators: Guarding Pain Intervention(s): Repositioned;Limited activity within patient's tolerance    Home Living Family/patient expects to be discharged to:: Skilled nursing facility                      Prior Function                 Hand Dominance   Dominant Hand: Right    Extremity/Trunk Assessment   Upper Extremity Assessment Upper Extremity Assessment: Defer to OT evaluation    Lower Extremity Assessment Lower Extremity Assessment: Generalized weakness;RLE deficits/detail;LLE deficits/detail RLE Deficits / Details: limited PROM LLE Deficits / Details: able to actively move L LE in limited range       Communication   Communication: Expressive difficulties;Other (comment) (speech very quiet and weak)  Cognition Arousal/Alertness: Lethargic Behavior During Therapy: Flat affect Overall Cognitive Status: No family/caregiver present to determine baseline cognitive functioning                                 General Comments:  Pt with flat affect and very sleepy. Attempted to answer some questions, but too weak to really be able to understand what he was saying.      General Comments      Exercises     Assessment/Plan    PT Assessment Patient needs continued PT services  PT Problem List Decreased strength;Decreased range of motion;Decreased activity tolerance;Decreased balance;Decreased mobility;Decreased coordination       PT Treatment Interventions DME instruction;Gait training;Functional mobility training;Neuromuscular re-education;Balance training;Therapeutic exercise;Therapeutic activities;Patient/family education    PT Goals (Current goals can be found in the Care Plan section)  Acute Rehab PT Goals PT Goal Formulation: Patient unable to participate in goal setting Time  For Goal Achievement: 01/15/21 Potential to Achieve Goals: Fair    Frequency Min 2X/week   Barriers to discharge        Co-evaluation PT/OT/SLP Co-Evaluation/Treatment: Yes Reason for Co-Treatment: Complexity of the patient's impairments (multi-system involvement) PT goals addressed during session: Strengthening/ROM         AM-PAC PT "6 Clicks" Mobility  Outcome Measure Help needed turning from your back to your side while in a flat bed without using bedrails?: Total Help needed moving from lying on your back to sitting on the side of a flat bed without using bedrails?: Total Help needed moving to and from a bed to a chair (including a wheelchair)?: Total Help needed standing up from a chair using your arms (e.g., wheelchair or bedside chair)?: Total Help needed to walk in hospital room?: Total Help needed climbing 3-5 steps with a railing? : Total 6 Click Score: 6    End of Session   Activity Tolerance: Patient limited by lethargy Patient left: in bed Nurse Communication: Mobility status;Need for lift equipment PT Visit Diagnosis: Muscle weakness (generalized) (M62.81);Other abnormalities of gait and mobility (R26.89)    Time: 1025-8527 PT Time Calculation (min) (ACUTE ONLY): 19 min   Charges:   PT Evaluation $PT Eval Low Complexity: 1 Low          Cina Klumpp L. Tamala Julian, [place] Pager [phone] 01/01/2021   Galen Manila 01/01/2021, 10:13 AM

## 2021-01-01 NOTE — Plan of Care (Signed)

## 2021-01-01 NOTE — Progress Notes (Signed)
[facility] Progress Note  Patient: Colton Bush    MCN:[id number]  DOA: 12/31/2020     Date of Service: the patient was seen and examined on 01/01/2021  Brief hospital course: Past medical history of HTN, HLD, recurrent pancreatic cancer, recent empyema as well as recurrent hepatic abscess.  Presents with complaints of nausea vomiting and found to have recurrence of his hepatic abscess as well as Klebsiella bacteremia. Currently plan is continue current antibiotics and further work-up.  Assessment and Plan: 1.  Recurrent hepatic abscess. Klebsiella bacteremia Early gram-negative sepsis Treated with IV ceftriaxone and Flagyl in the past. Also had IR guided drain placement. CT scan shows evidence of reoccurrence of the abscess as well as increasing numbers. Appreciate IR assistance. We will reevaluate tomorrow.  Currently we will provide vitamin K Currently on IV ceftriaxone and Flagyl as well as Zyvox. ID consulted.  Appreciate assistance. Concern is with the patient will continue to have recurrent abscesses and infection due to his immunosuppressed status from pancreatic cancer. Recommend family to consider palliative care and goals of care conversation for this patient.  2.  Adenocarcinoma of the head of the pancreas. Prior history of Whipple's procedure SP chemo and radiation. Current recurrence is nonresectable. Radiation was planned. Continue Creon.  3. Type 2 Diabetes Mellitus,  uncontrolled with hyperglycemia without  Complication  -On insulin sliding scale sensitive  4.  History of SDH, seizure disorder On Keppra.  We will continue.  Switch to IV Required bur hole surgery on 4/12. Monitor.  5.  BPH with chronic indwelling Foley catheter. At risk for recurrent infection. Currently on Flomax. Monitor.  6.  Goals of care conversation. Patient has frequent admissions during the hospitalization requiring multiple antibiotics due to MDRO infection. With his  pancreatic cancer history prognosis is guarded regardless of infection status. At present we will consider palliative care consultation. Discussed with wife, currently not ready to have discussion about goals of care and would like to continue full code for now.  7.  Acute thrombocytopenia Likely in the setting of infection. Monitor.  8.  Hypokalemia. Currently being replaced.  9.  Chronically elevated LFT. Secondary to his history of pancreatic cancer. Currently stable.  Monitor.  10.  Coagulopathy. The setting of severe infection. Will provide vitamin K to facilitate procedure  11.  Leukocytosis. Mildly worsening. Will monitor for now.  Diet: Dysphagia 1 diet thin liquid DVT Prophylaxis:   SCDs Start: 12/31/20 1654    Advance goals of care discussion: Full code  Family Communication: no family was present at bedside, at the time of interview.  Attempt to call the wife was unsuccessful.  Left voicemail.  Disposition:  Status is: Inpatient  Remains inpatient appropriate because:IV treatments appropriate due to intensity of illness or inability to take PO   Dispo: The patient is from: SNF              Anticipated d/c is to: SNF              Patient currently is not medically stable to d/c.   Difficult to place patient No  Subjective: Patient more fatigued and tired but no nausea no vomiting.  Abdominal pain improving.  No fever no chills.  No diarrhea.  Physical Exam:  General: Appear in moderate distress, no Rash; Oral Mucosa crusted secretion, dry. no Abnormal Neck Mass Or lumps, Conjunctiva normal  Cardiovascular: S1 and S2 Present, no Murmur, Respiratory: good respiratory effort, Bilateral Air entry present and CTA, no Crackles,  no wheezes Abdomen: Bowel Sound present, Soft and right-sided tenderness Extremities: no Pedal edema Neurology: oriented to time, place, and person affect appropriate. no new focal deficit, lethargic Gait not checked due to patient  safety concerns  Vitals:   01/01/21 1000 01/01/21 1352 01/01/21 1356 01/01/21 1628  BP:  94/63 103/69 117/79  Pulse: (!) 106 81 86 85  Resp:  15  18  Temp:  98.4 F (36.9 C)  97.7 F (36.5 C)  TempSrc:  Oral  Oral  SpO2:  97% 96% 98%  Weight:      Height:        Intake/Output Summary (Last 24 hours) at 01/01/2021 1702 Last data filed at 01/01/2021 1500 Gross per 24 hour  Intake 2844.33 ml  Output 225 ml  Net 2619.33 ml   Filed Weights   12/31/20 1024 01/01/21 0500  Weight: 70 kg 72.2 kg    Data Reviewed: I have personally reviewed and interpreted daily labs, tele strips, imaging. I reviewed all nursing notes, pharmacy notes, vitals, pertinent old records I have discussed plan of care as described above with RN and patient/family.  CBC: Recent Labs  Lab 12/28/20 0621 12/31/20 1128 01/01/21 0557  WBC 7.0 16.3* 24.4*  NEUTROABS  --  15.5*  --   HGB 11.5* 11.1* 10.4*  HCT 34.8* 33.6* 31.1*  MCV 90.4 89.6 89.9  PLT 286 83* 39*   Basic Metabolic Panel: Recent Labs  Lab 12/28/20 0621 12/31/20 1128 12/31/20 1242 01/01/21 0557  NA 134* 138  --  138  K 4.4 2.7*  --  3.0*  CL 101 104  --  104  CO2 25 25  --  21*  GLUCOSE 259* 115*  --  171*  BUN 11 27*  --  30*  CREATININE 0.57* 0.55*  --  0.59*  CALCIUM 8.7* 8.6*  --  8.3*  MG 2.0  --  2.0  --     Studies: No results found.  Scheduled Meds: . insulin aspart  0-9 Units Subcutaneous Q4H  . lipase/protease/amylase  24,000 Units Oral TID AC  . midodrine  5 mg Oral BID WC   Continuous Infusions: . cefTRIAXone (ROCEPHIN)  IV 2 g (12/31/20 2051)  . famotidine (PEPCID) IV 20 mg (01/01/21 1307)  . lactated ringers 125 mL/hr at 01/01/21 1059  . levETIRAcetam 1,000 mg (01/01/21 1159)  . linezolid (ZYVOX) IV 600 mg (01/01/21 0836)  . metronidazole 500 mg (01/01/21 1316)   PRN Meds: HYDROmorphone (DILAUDID) injection, ondansetron **OR** ondansetron (ZOFRAN) IV, oxyCODONE  Time spent: 35 minutes  Author: Berle Mull, MD [facility] 01/01/2021 5:02 PM  To reach On-call, see care teams to locate the attending and reach out via [email or web address]. Between 7PM-7AM, please contact night-coverage If you still have difficulty reaching the attending provider, please page the [facility] (Director on Call) for [facility] on amion for assistance.

## 2021-01-01 NOTE — Progress Notes (Signed)
   01/01/21 0500  Assess: MEWS Score  Pulse Rate (!) 106  ECG Heart Rate (!) 105  Assess: MEWS Score  MEWS Temp 0  MEWS Systolic 0  MEWS Pulse 1  MEWS RR 0  MEWS LOC 0  MEWS Score 1  MEWS Score Color Green  Document  Patient Outcome Stabilized after interventions  Assess: SIRS CRITERIA  SIRS Temperature  0  SIRS Pulse 1  SIRS Respirations  0  SIRS WBC 0  SIRS Score Sum  1

## 2021-01-02 LAB — CBC WITH DIFFERENTIAL/PLATELET
Abs Immature Granulocytes: 0.22 10*3/uL — ABNORMAL HIGH (ref 0.00–0.07)
Abs Immature Granulocytes: 0.12 10*3/uL — ABNORMAL HIGH (ref 0.00–0.07)
Basophils Absolute: 0 10*3/uL (ref 0.0–0.1)
Basophils Absolute: 0 10*3/uL (ref 0.0–0.1)
Basophils Relative: 0 %
Basophils Relative: 0 %
Eosinophils Absolute: 0 10*3/uL (ref 0.0–0.5)
Eosinophils Absolute: 0 10*3/uL (ref 0.0–0.5)
Eosinophils Relative: 0 %
Eosinophils Relative: 0 %
HCT: 33 % — ABNORMAL LOW (ref 39.0–52.0)
HCT: 32.1 % — ABNORMAL LOW (ref 39.0–52.0)
Hemoglobin: 10.8 g/dL — ABNORMAL LOW (ref 13.0–17.0)
Hemoglobin: 10.8 g/dL — ABNORMAL LOW (ref 13.0–17.0)
Immature Granulocytes: 1 %
Immature Granulocytes: 1 %
Lymphocytes Relative: 3 %
Lymphocytes Relative: 5 %
Lymphs Abs: 0.6 10*3/uL — ABNORMAL LOW (ref 0.7–4.0)
Lymphs Abs: 0.7 10*3/uL (ref 0.7–4.0)
MCH: 29.5 pg (ref 26.0–34.0)
MCH: 30 pg (ref 26.0–34.0)
MCHC: 32.7 g/dL (ref 30.0–36.0)
MCHC: 33.6 g/dL (ref 30.0–36.0)
MCV: 90.2 fL (ref 80.0–100.0)
MCV: 89.2 fL (ref 80.0–100.0)
Monocytes Absolute: 0.4 10*3/uL (ref 0.1–1.0)
Monocytes Absolute: 0.4 10*3/uL (ref 0.1–1.0)
Monocytes Relative: 2 %
Monocytes Relative: 3 %
Neutro Abs: 19.3 10*3/uL — ABNORMAL HIGH (ref 1.7–7.7)
Neutro Abs: 14.8 10*3/uL — ABNORMAL HIGH (ref 1.7–7.7)
Neutrophils Relative %: 94 %
Neutrophils Relative %: 91 %
Platelets: 11 10*3/uL — CL (ref 150–400)
Platelets: 9 10*3/uL — CL (ref 150–400)
RBC: 3.66 MIL/uL — ABNORMAL LOW (ref 4.22–5.81)
RBC: 3.6 MIL/uL — ABNORMAL LOW (ref 4.22–5.81)
RDW: 21.5 % — ABNORMAL HIGH (ref 11.5–15.5)
RDW: 21 % — ABNORMAL HIGH (ref 11.5–15.5)
WBC: 20.7 10*3/uL — ABNORMAL HIGH (ref 4.0–10.5)
WBC: 16.1 10*3/uL — ABNORMAL HIGH (ref 4.0–10.5)
nRBC: 0 % (ref 0.0–0.2)
nRBC: 0 % (ref 0.0–0.2)

## 2021-01-02 LAB — TYPE AND SCREEN
ABO/RH(D): A NEG
Antibody Screen: NEGATIVE

## 2021-01-02 LAB — COMPREHENSIVE METABOLIC PANEL
ALT: 126 U/L — ABNORMAL HIGH (ref 0–44)
AST: 43 U/L — ABNORMAL HIGH (ref 15–41)
Albumin: 1.9 g/dL — ABNORMAL LOW (ref 3.5–5.0)
Alkaline Phosphatase: 830 U/L — ABNORMAL HIGH (ref 38–126)
Anion gap: 6 (ref 5–15)
BUN: 42 mg/dL — ABNORMAL HIGH (ref 8–23)
CO2: 27 mmol/L (ref 22–32)
Calcium: 8.2 mg/dL — ABNORMAL LOW (ref 8.9–10.3)
Chloride: 104 mmol/L (ref 98–111)
Creatinine, Ser: 0.62 mg/dL (ref 0.61–1.24)
GFR, Estimated: >60 mL/min (ref >60)
Glucose, Bld: 307 mg/dL — ABNORMAL HIGH (ref 70–99)
Potassium: 3 mmol/L — ABNORMAL LOW (ref 3.5–5.1)
Sodium: 137 mmol/L (ref 135–145)
Total Bilirubin: 3 mg/dL — ABNORMAL HIGH (ref 0.3–1.2)
Total Protein: 5 g/dL — ABNORMAL LOW (ref 6.5–8.1)

## 2021-01-02 LAB — TECHNOLOGIST SMEAR REVIEW: Plt Morphology: DECREASED

## 2021-01-02 LAB — GLUCOSE, CAPILLARY
Glucose-Capillary: 188 mg/dL — ABNORMAL HIGH (ref 70–99)
Glucose-Capillary: 254 mg/dL — ABNORMAL HIGH (ref 70–99)
Glucose-Capillary: 276 mg/dL — ABNORMAL HIGH (ref 70–99)
Glucose-Capillary: 284 mg/dL — ABNORMAL HIGH (ref 70–99)
Glucose-Capillary: 306 mg/dL — ABNORMAL HIGH (ref 70–99)
Glucose-Capillary: 316 mg/dL — ABNORMAL HIGH (ref 70–99)

## 2021-01-02 LAB — FIBRINOGEN: Fibrinogen: 415 mg/dL (ref 210–475)

## 2021-01-02 LAB — AMMONIA: Ammonia: 36 umol/L — ABNORMAL HIGH (ref 9–35)

## 2021-01-02 LAB — PROTIME-INR
INR: 1.4 — ABNORMAL HIGH (ref 0.8–1.2)
Prothrombin Time: 17.4 seconds — ABNORMAL HIGH (ref 11.4–15.2)

## 2021-01-02 MED ORDER — INSULIN ASPART 100 UNIT/ML IJ SOLN
0.0000 [IU] | Freq: Every day | INTRAMUSCULAR | Status: DC
  Administered 2021-01-02: 4 [IU] via SUBCUTANEOUS

## 2021-01-02 MED ORDER — SODIUM CHLORIDE 0.9% IV SOLUTION: Freq: Once | INTRAVENOUS | Status: AC

## 2021-01-02 MED ORDER — INSULIN ASPART 100 UNIT/ML IJ SOLN
0.0000 [IU] | Freq: Three times a day (TID) | INTRAMUSCULAR | Status: DC
  Administered 2021-01-02: 3 [IU] via SUBCUTANEOUS
  Administered 2021-01-02 (×2): 8 [IU] via SUBCUTANEOUS
  Administered 2021-01-03: 11 [IU] via SUBCUTANEOUS

## 2021-01-02 NOTE — Progress Notes (Signed)
IR consulted by Dr. Posey Pronto for management of recurrent hepatic abscesses (possible aspiration/drainage).  Case/images have been reviewed by Dr. Kathlene Cote- patient clinically improving (no longer tachycardic, WBCs down to 20.7 (from 24.4 day prior). In addition, platelets 11 today. Do not recommend percutaneous drainage at this time- recommend repeat CT a/p (without contrast) tomorrow and will re-evaluate at this time. In addition, recommend type and cross due to platelet count. Please keep NPO at midnight for possible procedure tomorrow (after re-evaluation of above). Dr. Posey Pronto made aware.  Please call IR with questions/concerns.   Bea Graff Kare Dado, PA-C 01/02/2021, 11:48 AM

## 2021-01-02 NOTE — Consult Note (Signed)
Consultation Note Date: 01/02/2021   Patient Name: Colton Bush  DOB: 26-Feb-1956  MRN: [id number]  Age / Sex: 65 y.o., male  PCP: [name], MD Referring Physician: Lavina Hamman, MD  Reason for Consultation: Establishing goals of care  HPI/Patient Profile: 65 y.o. male  with past medical history of hypertension, hyperlipidemia, recurrent pancreatic cancer, recent empyema and recurrent hepatic abscess admitted on 12/31/2020 with complaints of nausea and vomiting and found to have recurrence of his hepatic abscess as well as Klebsiella bacteremia.  Palliative consulted for goals of care..   Clinical Assessment and Goals of Care: I met today with [name] and his wife.  He was awake and somewhat participated in conversation, but overall he is weak and much of conversation was had between myself and his wife.  She tells me that Colton Bush is a very social person who enjoys spending time with family and other people.  He has 1 son from a previous marriage.  He enjoys going out to eat and generally socializing with other people.  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  We discussed clinical course as well as wishes moving forward in regard to advanced directives.  We discussed both his underlying cancer and recurrent infections with worsening hepatic abscesses. Concepts specific to code status and rehospitalization discussed.  We discussed difference between a aggressive medical intervention path and a palliative, comfort focused care path.  Values and goals of care important to patient and family were attempted to be elicited.  Questions and concerns addressed.     Later in the afternoon, I received phone call from RN that they wish to rediscuss regarding changing [place].  I met with family again.  We discussed that in light of multiple chronic medical problems that have worsened with this acute problem, care should be  focused on interventions that are likely to allow Colton Bush to achieve goal of getting back to home and spending time with family. I discussed with his wife regarding heroic interventions at the end-of-life and they agree this would not be in line with wishes for a natural death or be likely to lead to getting well enough to go back home. They were in agreement with changing CODE STATUS to DO NOT RESUSCITATE.  SUMMARY OF RECOMMENDATIONS   - DNR/DNI - Continue current interventions.  Plan to reach out to oncology and radiation oncology tomorrow for further input.  Plan was for initiation of radiation on 6/7, but discussed concern with his wife that he remains weak and has recurrent infection/abscess formation.   - PMT to continue to follow.  Code Status/Advance Care Planning:  DNR  Palliative Prophylaxis:   Frequent Pain Assessment  Additional Recommendations (Limitations, Scope, Preferences):  Full Scope Treatment  Psycho-social/Spiritual:   Desire for further Chaplaincy support:Did not address today  Additional Recommendations: Caregiving  Support/Resources  Prognosis:   Guarded  Discharge Planning: To Be Determined      Primary Diagnoses: Present on Admission: . Hepatic abscess . Adenocarcinoma of head of pancreas (  [place]) . Hypokalemia   I have reviewed the medical record, interviewed the patient and family, and examined the patient. The following aspects are pertinent.  Past Medical History:  Diagnosis Date  . Arthritis    left hand  . Bronchitis 1977  . Cancer ([place]) 03/09/2016   pancreatic cancer, [facility] Crist[facility]   . Depression    takes Cymbalta daily  . Diabetes mellitus type II    sees Dr. Chalmers Cater   . GERD (gastroesophageal reflux disease)    takes Omeprazole daily  . H/O hiatal hernia   . Hyperlipidemia    takes Zocor daily  . Hypertension    takes Amlodipine daily  . Hypoglycemia 06/18/2017  . Neck pain    C4-7 stenosis and herniated disc  .  Neuromuscular disorder ([place])    hiatal hernia  . Scoliosis    slight  . Spinal cord injury, C5-C7 ([place])    c4-c7  . Stiffness of hand joint    d/t cervical issues   Social History   Socioeconomic History  . Marital status: Married    Spouse name: Colton Bush  . Number of children: 1  . Years of education: Not on file  . Highest education level: Not on file  Occupational History  . Occupation: Geophysicist/field seismologist  Tobacco Use  . Smoking status: Never Smoker  . Smokeless tobacco: Never Used  . Tobacco comment: tried a pipe 35 years ago   Vaping Use  . Vaping Use: Never used  Substance and Sexual Activity  . Alcohol use: No    Alcohol/week: 0.0 standard drinks  . Drug use: No  . Sexual activity: Yes  Other Topics Concern  . 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 Difficulty of Paying Living Expenses: Not hard at all  Food Insecurity: No Food Insecurity  . Worried About Charity fundraiser in the Last Year: Never true  . Ran Out of Food in the Last Year: Never true  Transportation Needs: No Transportation Needs  . Lack of Transportation (Medical): No  . Lack of Transportation (Non-Medical): No  Physical Activity: Inactive  . Days of Exercise per Week: 0 days  . Minutes of Exercise per Session: 0 min  Stress: No Stress Concern Present  . Feeling of Stress : Not at all  Social Connections: Socially Isolated  . Frequency of Communication with Friends and Family: Twice a week  . Frequency of Social Gatherings with Friends and Family: Never  . Attends Religious Services: Never  . Active Member of Clubs or Organizations: No  . Attends Archivist Meetings: Never  . Marital Status: Married   Family History  Problem Relation Age of Onset  . Heart disease Father   . Heart disease Brother 35  . Anesthesia problems Mother   . Heart disease Mother   . Dementia  Mother   . Diabetes Sister   . Stroke Sister   . Colon cancer Neg Hx   . Rectal cancer Neg Hx   . Stomach cancer Neg Hx    Scheduled Meds: . insulin aspart  0-15 Units Subcutaneous TID WC  . insulin aspart  0-5 Units Subcutaneous QHS  . lipase/protease/amylase  24,000 Units Oral TID AC  . midodrine  5 mg Oral BID WC   Continuous Infusions: . cefTRIAXone (ROCEPHIN)  IV 2 g (  01/01/21 2020)  . famotidine (PEPCID) IV 20 mg (01/02/21 1236)  . lactated ringers 125 mL/hr at 01/02/21 1028  . levETIRAcetam 1,000 mg (01/02/21 0949)  . linezolid (ZYVOX) IV 600 mg (01/02/21 1027)  . metronidazole 500 mg (01/02/21 1514)   PRN Meds:.HYDROmorphone (DILAUDID) injection, ondansetron **OR** ondansetron (ZOFRAN) IV, oxyCODONE Medications Prior to Admission:  Prior to Admission medications   Medication Sig Start Date End Date Taking? Authorizing Provider  acetaminophen (TYLENOL) 325 MG tablet Take 1-2 tablets (325-650 mg total) by mouth every 4 (four) hours as needed for mild pain. Patient taking differently: Take 650 mg by mouth every 4 (four) hours as needed for mild pain. 12/08/20  Yes Angiulli, Lavon Paganini, PA-C  ascorbic acid (VITAMIN C) 500 MG tablet Take 1 tablet (500 mg total) by mouth daily. 12/08/20  Yes Angiulli, Lavon Paganini, PA-C  citalopram (CELEXA) 40 MG tablet Take 1 tablet (40 mg total) by mouth daily. 12/21/20  Yes Dessa Phi, DO  diclofenac Sodium (VOLTAREN) 1 % GEL Apply 2 g topically 4 (four) times daily. To right knee 12/08/20  Yes Angiulli, Lavon Paganini, PA-C  esomeprazole (NEXIUM) 20 MG capsule Take 20 mg by mouth daily.   Yes [provider]  feeding supplement, GLUCERNA SHAKE, (GLUCERNA SHAKE) LIQD Take 237 mLs by mouth 2 (two) times daily between meals.   Yes [provider]  hydrocerin (EUCERIN) CREA Apply 1 application topically 2 (two) times daily. To legs and back 12/08/20  Yes Angiulli, Lavon Paganini, PA-C  HYDROcodone-acetaminophen (NORCO/VICODIN) 5-325 MG tablet Take 1  tablet by mouth every 3 (three) hours as needed for severe pain. 12/21/20  Yes Dessa Phi, DO  hydrocortisone cream 1 % Apply topically 2 (two) times daily. To back/areas of itching 12/08/20  Yes Angiulli, Lavon Paganini, PA-C  insulin aspart (NOVOLOG FLEXPEN) 100 UNIT/ML FlexPen Inject 2-13 Units into the skin 3 (three) times daily with meals. Sliding Scale insulin for blood sugars from 101-150 - 2 units. For blood sugars from 151-200 - 3 units. For blood sugars from 201-250 - 5 units. For blood sugars from 301-350 use 7 units. Call MD if blood sugars are over 300. Patient taking differently: Inject 2-9 Units into the skin 3 (three) times daily with meals. Sliding Scale insulin for blood sugars from 101-150 - 2 units. For blood sugars from 151-200 - 3 units. For blood sugars from 201-250 - 5 units. For blood sugars from 301-350 use 7 units. Call MD if blood sugars are over 350 12/08/20  Yes Mccathern, Ivan Anchors, PA-C  insulin detemir (LEVEMIR) 100 UNIT/ML FlexPen Inject 14 Units into the skin daily. Patient taking differently: Inject 14 Units into the skin at bedtime. 12/21/20  Yes Dessa Phi, DO  levETIRAcetam (KEPPRA) 1000 MG tablet Take 1 tablet (1,000 mg total) by mouth 2 (two) times daily. 12/08/20  Yes Angiulli, Lavon Paganini, PA-C  lidocaine (LIDODERM) 5 % Place 1 patch onto the skin daily. Apply to lower back at 8 pm and remove at 8 am daily 12/08/20  Yes Angiulli, Lavon Paganini, PA-C  linezolid (ZYVOX) 600 MG tablet Take 1 tablet (600 mg total) by mouth every 12 (twelve) hours for 22 days. 12/21/20 01/20/2021 Yes Dessa Phi, DO  lipase/protease/amylase 24000-76000 units CPEP Take 1 capsule (24,000 Units total) by mouth 3 (three) times daily before meals. 12/08/20  Yes Angiulli, Lavon Paganini, PA-C  loperamide (IMODIUM) 2 MG capsule Take 1 capsule (2 mg total) by mouth daily as needed for diarrhea or loose stools. 12/28/20  Yes Carlisle Cater, PA-C  loratadine (CLARITIN) 10 MG tablet Take 1 tablet (10 mg total) by mouth  daily. 12/08/20  Yes Angiulli, Lavon Paganini, PA-C  midodrine (PROAMATINE) 5 MG tablet Take 1 tablet (5 mg total) by mouth 3 (three) times daily with meals. 12/08/20  Yes Angiulli, Lavon Paganini, PA-C  Multiple Vitamin (MULTIVITAMIN WITH MINERALS) TABS tablet Take 1 tablet by mouth daily.   Yes [provider]  omeprazole (PRILOSEC) 20 MG capsule TAKE 1 CAPSULE DAILY Patient taking differently: Take 20 mg by mouth daily. 11/17/20  Yes [name], MD  ondansetron (ZOFRAN-ODT) 4 MG disintegrating tablet Take 1 tablet (4 mg total) by mouth every 6 (six) hours as needed for nausea or vomiting (can alternate with compazine). 12/08/20  Yes Angiulli, Lavon Paganini, PA-C  polycarbophil (FIBERCON) 625 MG tablet Take 1 tablet (625 mg total) by mouth 2 (two) times daily. 12/08/20  Yes Angiulli, Lavon Paganini, PA-C  polyethylene glycol (MIRALAX / GLYCOLAX) 17 g packet Take 17 g by mouth daily as needed for mild constipation. 11/12/20  Yes Debbe Odea, MD  simethicone (MYLICON) 80 MG chewable tablet Chew 1 tablet (80 mg total) by mouth 4 (four) times daily -  with meals and at bedtime. 12/08/20  Yes Angiulli, Lavon Paganini, PA-C  tamsulosin (FLOMAX) 0.4 MG CAPS capsule Take 2 capsules (0.8 mg total) by mouth daily. 12/08/20  Yes Angiulli, Lavon Paganini, PA-C  zinc sulfate 220 (50 Zn) MG capsule Take 1 capsule (220 mg total) by mouth daily. 12/08/20  Yes Angiulli, Lavon Paganini, PA-C  Insulin Pen Needle (PEN NEEDLES) 32G X 6 MM MISC 1 application by Does not apply route as needed. 12/08/20   Kovack, Ivan Anchors, PA-C   No Known Allergies Review of Systems  Constitutional: Positive for activity change and fatigue.  Neurological: Positive for weakness.  Psychiatric/Behavioral: Positive for sleep disturbance.    Physical Exam  General: Sleepy but arousable, in no acute distress.  HEENT: No bruits, no goiter, no JVD Heart: Regular rate and rhythm. No murmur appreciated. Lungs: Good air movement, clear Abdomen: Soft, nontender, nondistended,  positive bowel sounds.  Ext: No significant edema Skin: Warm and dry Vital Signs: BP 128/73 (BP Location: Right Arm)   Pulse 74   Temp (!) 97.5 F (36.4 C) (Oral)   Resp 16   Ht 5' 11" (1.803 m)   Wt 76 kg   SpO2 99%   BMI 23.37 kg/m  Pain Scale: 0-10   Pain Score: 0-No pain   SpO2: SpO2: 99 % O2 Device:SpO2: 99 % O2 Flow Rate: .   IO: Intake/output summary:   Intake/Output Summary (Last 24 hours) at 01/02/2021 1810 Last data filed at 01/02/2021 1500 Gross per 24 hour  Intake 0 ml  Output 1850 ml  Net -1850 ml    LBM: Last BM Date: 12/31/20 Baseline Weight: Weight: 70 kg Most recent weight: Weight: 76 kg     Palliative Assessment/Data:   Flowsheet Rows   Flowsheet Row Most Recent Value  Intake Tab   Referral Department Hospitalist  Unit at Time of Referral Med/Surg Unit  Palliative Care Primary Diagnosis Cancer  Date Notified 12/31/20  Palliative Care Type New Palliative care  Reason for referral Clarify Goals of Care  Date of Admission 12/31/20  Date first seen by Palliative Care 01/02/21  # of days Palliative referral response time 2 Day(s)  # of days IP prior to Palliative referral 0  Clinical Assessment   Palliative Performance Scale Score 30%  Psychosocial & Spiritual Assessment   Palliative Care Outcomes   Patient/Family meeting held? Yes  Who was at the meeting? Patient and wife      Time: 1405-1500 ,1635-1700 Time Total: 80 Greater than 50%  of this time was spent counseling and coordinating care related to the above assessment and plan.  Signed by: Micheline Rough, MD   Please contact Palliative Medicine Team phone at [phone] for questions and concerns.  For individual provider: See Shea Evans

## 2021-01-02 NOTE — Progress Notes (Signed)
[facility] Progress Note  Patient: Colton Bush    TDH:[id number]  DOA: 12/31/2020     Date of Service: the patient was seen and examined on 01/02/2021  Brief hospital course: Past medical history of HTN, HLD, recurrent pancreatic cancer, recent empyema as well as recurrent hepatic abscess.  Presents with complaints of nausea vomiting and found to have recurrence of his hepatic abscess as well as Klebsiella bacteremia. Currently plan is continue current antibiotics and further work-up.  Assessment and Plan: 1.  Recurrent hepatic abscess. Klebsiella bacteremia Early gram-negative sepsis Treated with IV ceftriaxone and Flagyl in the past. Also had IR guided drain placement. CT scan shows evidence of reoccurrence of the abscess as well as increasing numbers. Appreciate IR assistance.  Currently conservative measures recommended.  Repeat CT scan recommended for 6/6. Currently on IV ceftriaxone and Flagyl as well as Zyvox. ID consulted.  Appreciate assistance. Concern is with the patient will continue to have recurrent abscesses and infection due to his immunosuppressed status from pancreatic cancer. Recommend family to consider palliative care and goals of care conversation for this patient.  2.  Adenocarcinoma of the head of the pancreas. Prior history of Whipple's procedure SP chemo and radiation. Current recurrence is nonresectable. Radiation was planned. Continue Creon.  3. Type 2 Diabetes Mellitus,  uncontrolled with hyperglycemia without  Complication  On insulin sliding scale  Changed to every 4 and changing.  Sensitive to water.  4.  History of SDH, seizure disorder On Keppra.  We will continue.  Switch to IV Required bur hole surgery on 4/12. Monitor.  5.  BPH with chronic indwelling Foley catheter. At risk for recurrent infection. Currently on Flomax. Monitor.  6.  Goals of care conversation. Patient has frequent admissions during the hospitalization  requiring multiple antibiotics due to MDRO infection. With his pancreatic cancer history prognosis is poor regardless of infection status. Appreciate palliative care consultation.  7.  Acute thrombocytopenia Likely in the setting of infection. Continues to trend down.  Suspect in the setting of acute infection. Monitor.  8.  Hypokalemia. Currently being replaced.  9.  Chronically elevated LFT. Secondary to his history of pancreatic cancer. Currently stable.  Monitor.  10.  Coagulopathy. In the setting of severe infection. X1 IV vitamin K was given to facilitate IR guided drainage. DIC is in the differential in the setting of gram-negative bacteremia as well as pancreatic malignancy.  We will check smear. D-dimer and fibrinogen here would not be helpful given patient's protein calorie malnutrition and hypoalbuminemia. Patient currently already being treated for underlying condition which is infection most likely. No evidence of bleeding. No evidence of thrombosis for now.  11.  Leukocytosis. Improving now. Will monitor for now.  Diet: Dysphagia 1 diet thin liquid DVT Prophylaxis:   SCDs Start: 12/31/20 1654    Advance goals of care discussion: Full code  Family Communication: no family was present at bedside, at the time of interview.  Disposition:  Status is: Inpatient  Remains inpatient appropriate because:IV treatments appropriate due to intensity of illness or inability to take PO   Dispo: The patient is from: SNF              Anticipated d/c is to: SNF              Patient currently is not medically stable to d/c.   Difficult to place patient No  Subjective: No nausea no vomiting.  No fever no chills.  No chest pain.  No abdominal  pain.  More alert and less fatigued today.  Physical Exam:  General: Appear in mild distress, no Rash; Oral Mucosa Clear, moist. no Abnormal Neck Mass Or lumps, Conjunctiva normal  Cardiovascular: S1 and S2 Present, no  Murmur, Respiratory: good respiratory effort, Bilateral Air entry present and CTA, no Crackles, no wheezes Abdomen: Bowel Sound present, Soft and no tenderness Extremities: no Pedal edema Neurology: Fatigue but alert and oriented to time, place, and person affect appropriate. no new focal deficit Gait not checked due to patient safety concerns  Vitals:   01/01/21 2146 01/02/21 0456 01/02/21 0500 01/02/21 1424  BP: 106/68 122/76  128/73  Pulse: 81 68  74  Resp: 18 18  16   Temp: 97.7 F (36.5 C) 97.7 F (36.5 C)  (!) 97.5 F (36.4 C)  TempSrc: Oral Oral  Oral  SpO2: 99% 100%  99%  Weight:   76 kg   Height:        Intake/Output Summary (Last 24 hours) at 01/02/2021 1507 Last data filed at 01/02/2021 0800 Gross per 24 hour  Intake 120 ml  Output 1400 ml  Net -1280 ml   Filed Weights   12/31/20 1024 01/01/21 0500 01/02/21 0500  Weight: 70 kg 72.2 kg 76 kg    Data Reviewed: I have personally reviewed and interpreted daily labs, tele strips, imaging. I reviewed all nursing notes, pharmacy notes, vitals, pertinent old records I have discussed plan of care as described above with RN and patient/family.  CBC: Recent Labs  Lab 12/28/20 0621 12/31/20 1128 01/01/21 0557 01/02/21 1032  WBC 7.0 16.3* 24.4* 20.7*  NEUTROABS  --  15.5*  --  19.3*  HGB 11.5* 11.1* 10.4* 10.8*  HCT 34.8* 33.6* 31.1* 33.0*  MCV 90.4 89.6 89.9 90.2  PLT 286 83* 39* 11*   Basic Metabolic Panel: Recent Labs  Lab 12/28/20 0621 12/31/20 1128 12/31/20 1242 01/01/21 0557 01/01/21 1820 01/02/21 1032  NA 134* 138  --  138 135 137  K 4.4 2.7*  --  3.0* 3.9 3.0*  CL 101 104  --  104 105 104  CO2 25 25  --  21* 22 27  GLUCOSE 259* 115*  --  171* 424* 307*  BUN 11 27*  --  30* 44* 42*  CREATININE 0.57* 0.55*  --  0.59* 0.60* 0.62  CALCIUM 8.7* 8.6*  --  8.3* 8.0* 8.2*  MG 2.0  --  2.0  --   --   --     Studies: No results found.  Scheduled Meds: . insulin aspart  0-15 Units Subcutaneous TID WC   . insulin aspart  0-5 Units Subcutaneous QHS  . lipase/protease/amylase  24,000 Units Oral TID AC  . midodrine  5 mg Oral BID WC   Continuous Infusions: . cefTRIAXone (ROCEPHIN)  IV 2 g (01/01/21 2020)  . famotidine (PEPCID) IV 20 mg (01/02/21 1236)  . lactated ringers 125 mL/hr at 01/02/21 1028  . levETIRAcetam 1,000 mg (01/02/21 0949)  . linezolid (ZYVOX) IV 600 mg (01/02/21 1027)  . metronidazole 500 mg (01/02/21 0516)   PRN Meds: HYDROmorphone (DILAUDID) injection, ondansetron **OR** ondansetron (ZOFRAN) IV, oxyCODONE  Time spent: 35 minutes  Author: Berle Mull, MD [facility] 01/02/2021 3:07 PM  To reach On-call, see care teams to locate the attending and reach out via [email or web address]. Between 7PM-7AM, please contact night-coverage If you still have difficulty reaching the attending provider, please page the [facility] (Director on Call) for [facility] on amion for assistance.

## 2021-01-03 ENCOUNTER — Ambulatory Visit: Payer: Medicare Other

## 2021-01-03 ENCOUNTER — Telehealth: Payer: Self-pay | Admitting: Radiation Oncology

## 2021-01-03 LAB — COMPREHENSIVE METABOLIC PANEL
ALT: 87 U/L — ABNORMAL HIGH (ref 0–44)
AST: 38 U/L (ref 15–41)
Albumin: 1.9 g/dL — ABNORMAL LOW (ref 3.5–5.0)
Alkaline Phosphatase: 820 U/L — ABNORMAL HIGH (ref 38–126)
Anion gap: 7 (ref 5–15)
BUN: 29 mg/dL — ABNORMAL HIGH (ref 8–23)
CO2: 25 mmol/L (ref 22–32)
Calcium: 7.7 mg/dL — ABNORMAL LOW (ref 8.9–10.3)
Chloride: 104 mmol/L (ref 98–111)
Creatinine, Ser: 0.44 mg/dL — ABNORMAL LOW (ref 0.61–1.24)
GFR, Estimated: >60 mL/min (ref >60)
Glucose, Bld: 348 mg/dL — ABNORMAL HIGH (ref 70–99)
Potassium: 3.3 mmol/L — ABNORMAL LOW (ref 3.5–5.1)
Sodium: 136 mmol/L (ref 135–145)
Total Bilirubin: 5 mg/dL — ABNORMAL HIGH (ref 0.3–1.2)
Total Protein: 4.8 g/dL — ABNORMAL LOW (ref 6.5–8.1)

## 2021-01-03 LAB — CBC WITH DIFFERENTIAL/PLATELET
Abs Immature Granulocytes: 0.1 10*3/uL — ABNORMAL HIGH (ref 0.00–0.07)
Basophils Absolute: 0 10*3/uL (ref 0.0–0.1)
Basophils Relative: 0 %
Eosinophils Absolute: 0 10*3/uL (ref 0.0–0.5)
Eosinophils Relative: 0 %
HCT: 30.3 % — ABNORMAL LOW (ref 39.0–52.0)
Hemoglobin: 10.2 g/dL — ABNORMAL LOW (ref 13.0–17.0)
Immature Granulocytes: 1 %
Lymphocytes Relative: 6 %
Lymphs Abs: 0.6 10*3/uL — ABNORMAL LOW (ref 0.7–4.0)
MCH: 29.8 pg (ref 26.0–34.0)
MCHC: 33.7 g/dL (ref 30.0–36.0)
MCV: 88.6 fL (ref 80.0–100.0)
Monocytes Absolute: 0.4 10*3/uL (ref 0.1–1.0)
Monocytes Relative: 4 %
Neutro Abs: 9.3 10*3/uL — ABNORMAL HIGH (ref 1.7–7.7)
Neutrophils Relative %: 89 %
Platelets: 20 10*3/uL — CL (ref 150–400)
RBC: 3.42 MIL/uL — ABNORMAL LOW (ref 4.22–5.81)
RDW: 21.2 % — ABNORMAL HIGH (ref 11.5–15.5)
WBC: 10.5 10*3/uL (ref 4.0–10.5)
nRBC: 0 % (ref 0.0–0.2)

## 2021-01-03 LAB — CULTURE, BLOOD (ROUTINE X 2)
Special Requests: ADEQUATE
Special Requests: ADEQUATE

## 2021-01-03 LAB — BPAM PLATELET PHERESIS
Blood Product Expiration Date: 202206082359
ISSUE DATE / TIME: 202206052039
Unit Type and Rh: 600

## 2021-01-03 LAB — PREPARE PLATELET PHERESIS: Unit division: 0

## 2021-01-03 LAB — GLUCOSE, CAPILLARY: Glucose-Capillary: 318 mg/dL — ABNORMAL HIGH (ref 70–99)

## 2021-01-03 LAB — PROTIME-INR
INR: 1.5 — ABNORMAL HIGH (ref 0.8–1.2)
Prothrombin Time: 18 seconds — ABNORMAL HIGH (ref 11.4–15.2)

## 2021-01-03 LAB — MAGNESIUM: Magnesium: 1.7 mg/dL (ref 1.7–2.4)

## 2021-01-03 MED ORDER — GLYCOPYRROLATE 1 MG PO TABS
1.0000 mg | ORAL_TABLET | ORAL | Status: DC | PRN
  Filled 2021-01-03: qty 1

## 2021-01-03 MED ORDER — ONDANSETRON HCL 4 MG/2ML IJ SOLN: 4.0000 mg | Freq: Four times a day (QID) | INTRAMUSCULAR | Status: DC | PRN

## 2021-01-03 MED ORDER — GLYCOPYRROLATE 0.2 MG/ML IJ SOLN: 0.2000 mg | INTRAMUSCULAR | Status: DC | PRN

## 2021-01-03 MED ORDER — LORAZEPAM 2 MG/ML PO CONC: 1.0000 mg | ORAL | Status: DC | PRN

## 2021-01-03 MED ORDER — ACETAMINOPHEN 325 MG PO TABS: 650.0000 mg | ORAL_TABLET | Freq: Four times a day (QID) | ORAL | Status: DC | PRN

## 2021-01-03 MED ORDER — ONDANSETRON 4 MG PO TBDP: 4.0000 mg | ORAL_TABLET | Freq: Four times a day (QID) | ORAL | Status: DC | PRN

## 2021-01-03 MED ORDER — MORPHINE SULFATE (CONCENTRATE) 10 MG/0.5ML PO SOLN: 5.0000 mg | ORAL | Status: DC | PRN

## 2021-01-03 MED ORDER — LORAZEPAM 2 MG/ML IJ SOLN: 1.0000 mg | INTRAMUSCULAR | Status: DC | PRN

## 2021-01-03 MED ORDER — ACETAMINOPHEN 650 MG RE SUPP: 650.0000 mg | Freq: Four times a day (QID) | RECTAL | Status: DC | PRN

## 2021-01-03 MED ORDER — LORAZEPAM 1 MG PO TABS: 1.0000 mg | ORAL_TABLET | ORAL | Status: DC | PRN

## 2021-01-03 MED ORDER — MORPHINE SULFATE (CONCENTRATE) 10 MG/0.5ML PO SOLN
5.0000 mg | ORAL | Status: DC | PRN
  Administered 2021-01-10: 5 mg via SUBLINGUAL
  Filled 2021-01-03: qty 0.5

## 2021-01-03 MED ORDER — MAGIC MOUTHWASH
15.0000 mL | Freq: Four times a day (QID) | ORAL | Status: DC | PRN
  Filled 2021-01-03: qty 15

## 2021-01-03 NOTE — Progress Notes (Signed)
[facility] Progress Note  Patient: Colton Bush    MHD:[id number]  DOA: 12/31/2020     Date of Service: the patient was seen and examined on 01/03/2021  Brief hospital course: Past medical history of HTN, HLD, recurrent pancreatic cancer, recent empyema as well as recurrent hepatic abscess.  Presents with complaints of nausea vomiting and found to have recurrence of his hepatic abscess as well as Klebsiella bacteremia. Currently plan is for comfort care.  Assessment and Plan: 1.  Recurrent hepatic abscess. Klebsiella bacteremia Early gram-negative sepsis Treated with IV ceftriaxone and Flagyl in the past. Also had IR guided drain placement. CT scan shows evidence of reoccurrence of the abscess as well as increasing numbers. Appreciate IR assistance.  Currently conservative measures recommended.  Repeat CT scan recommended for 6/6. Currently on IV ceftriaxone and Flagyl as well as Zyvox. ID consulted.  Appreciate assistance. Concern is with the patient will continue to have recurrent abscesses and infection due to his immunosuppressed status from pancreatic cancer. Extensive discussion with the wife at bedside on 6/6.  Currently transitioning to comfort care based on the discussion.  2.  Adenocarcinoma of the head of the pancreas. Prior history of Whipple's procedure SP chemo and radiation. Current recurrence is nonresectable. Radiation was planned. Continue Creon.  3. Type 2 Diabetes Mellitus,  uncontrolled with hyperglycemia without  Complication  On insulin sliding scale  Changed to every 4 and changing.  Sensitive to water.  4.  History of SDH, seizure disorder On Keppra.  We will continue.  Switch to IV Required bur hole surgery on 4/12. Monitor.  5.  BPH with chronic indwelling Foley catheter. At risk for recurrent infection. Currently on Flomax. Monitor.  6.  Goals of care conversation. Patient has frequent admissions during the hospitalization requiring  multiple antibiotics due to MDRO infection. With his pancreatic cancer history prognosis is poor regardless of infection status. Appreciate palliative care consultation. 6/6, I discussed with wife at bedside.  Currently agreeable to transition to comfort care.  She has concern regarding use of medication for hastening the process of dying and I have reassured her that medications are only going to be used for symptom control. Unrestricted visitation status.  7.  Acute thrombocytopenia Likely in the setting of infection. Continues to trend down.  Suspect in the setting of acute infection. Monitor.  8.  Hypokalemia. Currently being replaced.  9.  Chronically elevated LFT. Secondary to his history of pancreatic cancer. Currently stable.  Monitor.  10.  Coagulopathy. In the setting of severe infection. X1 IV vitamin K was given to facilitate IR guided drainage. DIC is in the differential in the setting of gram-negative bacteremia as well as pancreatic malignancy.  We will check smear. D-dimer and fibrinogen here would not be helpful given patient's protein calorie malnutrition and hypoalbuminemia. Patient currently already being treated for underlying condition which is infection most likely. No evidence of bleeding. No evidence of thrombosis for now.  11.  Leukocytosis. Improving now. Will monitor for now.  Diet: Dysphagia 1 diet thin liquid DVT Prophylaxis:   SCDs Start: 12/31/20 1654    Advance goals of care discussion: DNR/DNI, now comfort care.  Family Communication: family was present at bedside, at the time of interview.  Disposition:  Status is: Inpatient  Remains inpatient appropriate because:IV treatments appropriate due to intensity of illness or inability to take PO   Dispo: The patient is from: SNF  Anticipated d/c is to: SNF              Patient currently is not medically stable to d/c.   Difficult to place patient No  Subjective: No nausea  no vomiting.  No fever no chills.  More fatigue and tired today.  Does not report any pain.  Physical Exam:  General: Appear in mild distress, no Rash; Oral Mucosa Clear, dry. no Abnormal Neck Mass Or lumps, Conjunctiva normal  Cardiovascular: S1 and S2 Present, no Murmur, Respiratory: good respiratory effort, Bilateral Air entry present and CTA, no Crackles, no wheezes Abdomen: Bowel Sound present, Soft and no tenderness Extremities: no Pedal edema Neurology: Lethargic and oriented to person affect flat. no new focal deficit Gait not checked due to patient safety concerns   Vitals:   01/03/21 0527 01/03/21 0800 01/03/21 1200 01/03/21 1600  BP: (!) 152/83     Pulse: 87     Resp: 18 19 13 20   Temp: 97.7 F (36.5 C)     TempSrc: Oral     SpO2: 96%     Weight:      Height:        Intake/Output Summary (Last 24 hours) at 01/03/2021 1915 Last data filed at 01/03/2021 1822 Gross per 24 hour  Intake 1369.07 ml  Output 1250 ml  Net 119.07 ml   Filed Weights   01/01/21 0500 01/02/21 0500 01/03/21 0500  Weight: 72.2 kg 76 kg 81.4 kg    Data Reviewed: I have personally reviewed and interpreted daily labs, tele strips, imaging. I reviewed all nursing notes, pharmacy notes, vitals, pertinent old records I have discussed plan of care as described above with RN and patient/family.  CBC: Recent Labs  Lab 12/31/20 1128 01/01/21 0557 01/02/21 1032 01/02/21 1839 01/03/21 0713  WBC 16.3* 24.4* 20.7* 16.1* 10.5  NEUTROABS 15.5*  --  19.3* 14.8* 9.3*  HGB 11.1* 10.4* 10.8* 10.8* 10.2*  HCT 33.6* 31.1* 33.0* 32.1* 30.3*  MCV 89.6 89.9 90.2 89.2 88.6  PLT 83* 39* 11* 9* 20*   Basic Metabolic Panel: Recent Labs  Lab 12/28/20 0621 12/31/20 1128 12/31/20 1242 01/01/21 0557 01/01/21 1820 01/02/21 1032 01/03/21 0713  NA 134* 138  --  138 135 137 136  K 4.4 2.7*  --  3.0* 3.9 3.0* 3.3*  CL 101 104  --  104 105 104 104  CO2 25 25  --  21* 22 27 25   GLUCOSE 259* 115*  --  171*  424* 307* 348*  BUN 11 27*  --  30* 44* 42* 29*  CREATININE 0.57* 0.55*  --  0.59* 0.60* 0.62 0.44*  CALCIUM 8.7* 8.6*  --  8.3* 8.0* 8.2* 7.7*  MG 2.0  --  2.0  --   --   --  1.7    Studies: No results found.  Scheduled Meds:  Continuous Infusions: . lactated ringers 10 mL/hr at 01/03/21 1800  . levETIRAcetam Stopped (01/03/21 0923)   PRN Meds: acetaminophen **OR** acetaminophen, glycopyrrolate **OR** glycopyrrolate **OR** glycopyrrolate, HYDROmorphone (DILAUDID) injection, LORazepam **OR** LORazepam **OR** LORazepam, magic mouthwash, morphine CONCENTRATE **OR** morphine CONCENTRATE, ondansetron **OR** ondansetron (ZOFRAN) IV  Time spent: 35 minutes  Author: Berle Mull, MD [facility] 01/03/2021 7:15 PM  To reach On-call, see care teams to locate the attending and reach out via [email or web address]. Between 7PM-7AM, please contact night-coverage If you still have difficulty reaching the attending provider, please page the [facility] (Director on Call) for [facility] on amion for assistance.

## 2021-01-03 NOTE — Progress Notes (Signed)
Daily Progress Note   Patient Name: Colton Bush       Date: 01/03/2021 DOB: [birth year]  Age: 65 y.o. MRN#: [id number] Attending Physician: Lavina Hamman, MD Primary Care Physician: [name], MD Admit Date: 12/31/2020  Reason for Consultation/Follow-up: Establishing goals of care  Subjective: Chart reviewed.  Discussed with rad onc, oncology, and Dr. Posey Pronto.  I met today with Colton Bush and his wife.   His wife met me in the hall and 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 conversation with Dr. Posey Pronto with goal of focusing on comfort moving forward.  Discussed that I agree that comfort is most appropriate plan moving forward.  Reviewed plan to focus only on aggressive symptom management and have family come to visit.  She agreed and expressed understanding.  I met with Colton Bush.  He was in and out throughout conversation.  We discussed that he has unfixable problems and plan moving forward is to focus on aggressive symptom management and spending time with family.  He is not fully oriented, but he did express that seeing and spending time with family is most important thing to him.    Length of Stay: 3  Current Medications: Scheduled Meds:    Continuous Infusions: . lactated ringers 10 mL/hr at 01/03/21 1800  . levETIRAcetam 1,000 mg (01/03/21 2049)    PRN Meds: acetaminophen **OR** acetaminophen, glycopyrrolate **OR** glycopyrrolate **OR** glycopyrrolate, HYDROmorphone (DILAUDID) injection, LORazepam **OR** LORazepam **OR** LORazepam, magic mouthwash, morphine CONCENTRATE **OR** morphine CONCENTRATE, ondansetron **OR** ondansetron (ZOFRAN) IV  Physical Exam         General: Sleepy but arousable, in no acute distress.  Confused.  HEENT: No bruits, no goiter, no JVD Heart: Regular rate and rhythm. No  murmur appreciated. Lungs: Decreased air movement, clear Abdomen: Soft, nontender, nondistended, positive bowel sounds.  Ext: No significant edema Skin: Warm and dry  Vital Signs: BP (!) 152/83 (BP Location: Left Arm)   Pulse 87   Temp 97.7 F (36.5 C) (Oral)   Resp 20   Ht _0  (1.803 m)   Wt 81.4 kg   SpO2 96%   BMI 25.03 kg/m  SpO2: SpO2: 96 % O2 Device: O2 Device: Room Air O2 Flow Rate:    Intake/output summary:   Intake/Output Summary (Last 24 hours) at 01/03/2021 2352 Last data filed at 01/03/2021 8867  Gross per 24 hour  Intake 1074.07 ml  Output 1250 ml  Net -175.93 ml   LBM: Last BM Date: 01/03/21 Baseline Weight: Weight: 70 kg Most recent weight: Weight: 81.4 kg       Palliative Assessment/Data:    Flowsheet Rows   Flowsheet Row Most Recent Value  Intake Tab   Referral Department Hospitalist  Unit at Time of Referral Med/Surg Unit  Palliative Care Primary Diagnosis Cancer  Date Notified 12/31/20  Palliative Care Type New Palliative care  Reason for referral Clarify Goals of Care  Date of Admission 12/31/20  Date first seen by Palliative Care 01/02/21  # of days Palliative referral response time 2 Day(s)  # of days IP prior to Palliative referral 0  Clinical Assessment   Palliative Performance Scale Score 30%  Psychosocial & Spiritual Assessment   Palliative Care Outcomes   Patient/Family meeting held? Yes  Who was at the meeting? Patient and wife      Patient Active Problem List   Diagnosis Date Noted  . Bacteremia due to Klebsiella pneumoniae 01/01/2021  . Leukocytosis 01/01/2021  . Thrombocytopenia ([place]) 01/01/2021  . Empyema ([place]) 01/01/2021  . Hepatic abscess 12/31/2020  . Anemia of chronic illness 12/09/2020  . Hypokalemia 12/09/2020  . Nausea & vomiting   . Pain   . Bladder outlet obstruction 11/12/2020  . Toxic encephalopathy 11/12/2020  . Atrophic pancreas 11/12/2020  . Empyema lung ([place])   . Subdural hematoma ([place])   .  Pneumonia of both lower lobes due to infectious organism 11/06/2020  . Liver abscess 11/06/2020  . Sepsis ([place]) 11/05/2020  . Uncontrolled type 2 diabetes mellitus with ketoacidosis without coma, with long-term current use of insulin ([place]) 11/05/2020  . Increased anion gap metabolic acidosis 32/06/2481  . Chronic diastolic CHF (congestive heart failure) ([place]) 11/05/2020  . Closed right hip fracture, initial encounter ([place]) 10/18/2020  . Fall from ground level 10/18/2020  . Hypoglycemia 06/19/2017  . Hypothermia 06/19/2017  . Goals of care, counseling/discussion 09/29/2016  . Port catheter in place 04/11/2016  . Hypercalcemia 03/29/2016  . Adenocarcinoma of head of pancreas ([place]) 03/17/2016  . Biliary obstruction   . Obstructive jaundice due to malignant neoplasm ([place]) 02/27/2016  . DKA (diabetic ketoacidosis) ([place]) 02/27/2016  . Mixed diabetic hyperlipidemia associated with type 2 diabetes mellitus ([place]) 05/05/2014  . Cervical spondylosis with myelopathy 08/18/2011  . CERUMEN IMPACTION 12/14/2008  . Diabetes ([place]) 09/16/2007  . Hyperlipidemia, mixed 09/16/2007  . Essential hypertension 09/16/2007  . GERD without esophagitis 09/16/2007  . ESOPHAGEAL STRICTURE 04/04/2007  . HIATAL HERNIA 04/04/2007    Palliative Care Assessment & Plan   Patient Profile: 65 y.o. male  with past medical history of hypertension, hyperlipidemia, recurrent pancreatic cancer, recent empyema and recurrent hepatic abscess admitted on 12/31/2020 with complaints of nausea and vomiting and found to have recurrence of his hepatic abscess as well as Klebsiella bacteremia.  Palliative consulted for goals of care.  Recommendations/Plan:  DNR/DNI  Plan for comfort moving forward.    Visitation per end of life policy.  Goals of Care and Additional Recommendations:  Limitations on Scope of Treatment: Full Comfort Care  Code Status:    Code Status Orders  (From admission, onward)         Start      Ordered   01/03/21 1126  Do not attempt resuscitation (DNR)  Continuous       Question Answer Comment  In the event of cardiac or respiratory ARREST Do not  call a "code blue"   In the event of cardiac or respiratory ARREST Do not perform Intubation, CPR, defibrillation or ACLS   In the event of cardiac or respiratory ARREST Use medication by any route, position, wound care, and other measures to relive pain and suffering. May use oxygen, suction and manual treatment of airway obstruction as needed for comfort.      01/03/21 1126        Code Status History    Date Active Date Inactive Code Status Order ID Comments User Context   01/02/2021 1650 01/03/2021 1126 DNR [id number]  Micheline Rough, MD Inpatient   12/31/2020 1655 01/02/2021 1650 Full Code [id number]  Lavina Hamman, MD ED   12/15/2020 1609 12/22/2020 2006 Full Code [id number]  Norval Morton, MD ED   11/12/2020 1820 12/08/2020 1821 Full Code [id number]  Bary Leriche, PA-C Inpatient   11/05/2020 2348 11/12/2020 1756 Full Code [id number]  Vernelle Emerald, MD ED   10/18/2020 2223 10/28/2020 1955 Full Code [id number]  Doran Heater, DO ED   06/19/2017 0232 06/20/2017 2149 Full Code [id number]  Rise Patience, MD ED   09/19/2016 1425 09/25/2016 2116 Full Code [id number]  Stark Klein, MD Inpatient   02/27/2016 2042 03/02/2016 1607 Full Code [id number]  Benito Mccreedy, MD Inpatient   02/27/2016 2042 02/27/2016 2042 Full Code [id number]  Benito Mccreedy, MD Inpatient   08/18/2011 2216 08/23/2011 2339 Full Code [id number]  Theone Stanley, RN Inpatient   Advance Care Planning Activity       Prognosis:   Likely days.  He has recurrent infection with abscesses of liver.  With plan for comfort, I believe this will progress rapidly.  Discharge Planning:  To Be Determined- ? Hospital death  Care plan was discussed with wife, Dr. Posey Pronto, Rad onc, oncology, bedside RN  Thank you for allowing the Palliative Medicine Team to assist in the care  of this patient.   Time In: 1150 Time Out: 1230 Total Time 40 Prolonged Time Billed No      Greater than 50%  of this time was spent counseling and coordinating care related to the above assessment and plan.  Micheline Rough, MD  Please contact Palliative Medicine Team phone at [phone] for questions and concerns.

## 2021-01-03 NOTE — Telephone Encounter (Signed)
After contact with palliative care and communication about how ill the patient remains, we will delay simulation another week or two and see how he's faring since his course has been incredibly delayed and complicated by infections.

## 2021-01-03 NOTE — Progress Notes (Addendum)
HEMATOLOGY-ONCOLOGY PROGRESS NOTE  SUBJECTIVE: Colton Bush is well-known to our practice.  We follow him for locally recurrent pancreatic adenocarcinoma.  He was due to begin radiation in the near future.  However, he has had recurrent hospitalizations for sepsis and liver abscess.  He has been residing in a skilled nursing facility.  Now admitted for abdominal pain, nausea, vomiting.  CT abdomen/pelvis on admission showed multiple hepatic abscesses but cannot exclude metastatic disease.  Blood cultures positive for Klebsiella.  Susceptibilities pending.  I saw the patient and his hospital room this morning.  He is laying in bed.  No family at the bedside.  He had difficulty staying awake when I was talking to him.  He complains of needing to urinate but has a Foley catheter in place.  He answers "yes" to all questions that I ask him including whether or not he has abdominal pain, nausea, vomiting, diarrhea.  Unclear if this is accurate.  He cannot tell me where he is this morning.  No bleeding noted.  Oncology History Overview Note  Cancer Staging Adenocarcinoma of head of pancreas [facility]) Staging form: Pancreas, AJCC 7th Edition - Clinical stage from 03/09/2016: Stage IIB (T2, N1, M0) - Signed by Truitt Merle, MD on 03/17/2016 - Pathologic stage from 11/17/2016: Stage IB (yT2, N0, cM0) - Signed by Truitt Merle, MD on 12/10/2016     Adenocarcinoma of head of pancreas ([place])  02/27/2016 Imaging   CT chest, abdomen and pelvis with contrast showed ill-defined heterogeneity of pancreatic head, highly suspicious for malignancy, ) quit about and biliary ductal dilatation, mildly prominent lymph nodes in the upper abdomen, largest 2 cm in the portocaval . No other metastasis.    03/09/2016 Initial Diagnosis   Adenocarcinoma of head of pancreas ([name])   03/09/2016 Procedure   Upper EUS showed a 3.5 cm irregular mass in the pancreatic head, causing pancreatic and biliary duct obstruction. The mass involves the portal  vein 422 mm, strongly suggesting invasion. There is suspicious nearby adenopathy   03/09/2016 Initial Biopsy   Fine-needle aspiration of the pancreatic mass from EUS showed malignant cells consistent with adenocarcinoma.    03/28/2016 - 05/24/2016 Chemotherapy   neoadjuvant chemo with FOLFIRINOX every 2 weeks, for 5 cycles    06/28/2016 - 08/10/2016 Radiation Therapy   Neoadjuvant radiation by Dr. [name] Site/dose:   Pancreas treated to 45 Gy in 25 fractions. The Pancreas was then boosted to 54 Gy in 5 fractions.   06/28/2016 - 08/10/2016 Chemotherapy   Xeloda 2000 mg in the morning and 1500 mg in the evening, on the day of radiation.   09/04/2016 Imaging   CT Chest Abdomen Pelvis IMPRESSION: 1. Poorly defined pancreatic head mass is grossly stable, with associated pancreatic ductal dilatation and portacaval adenopathy. No associated vascular encasement. 2. Common bile duct stent in place with stable biliary ductal dilatation. Left hepatic lobe atrophy. 3. Hepatomegaly.  Spleen is at the upper limits normal in size. 4. Aortic atherosclerosis (ICD10-170.0). Coronary artery calcification.   09/19/2016 Surgery   Diagnostic laparoscopy and possible whipple procedure by Dr. Barry Dienes. Whipple procedure aborted due to the pancreatic cancer involving SMV, SMA and portal veins.   09/19/2016 Pathology Results   Two liver lesions were noted during diagnostic laparoscopy and biopsy revealed benign hepatic parenchyma with assoicated fibrosis with no evidence of malignancy.   10/06/2016 - 10/20/2016 Chemotherapy   Gemcitabine and Abraxane weekly, on day 1, 8, and 15 every 28 days, starting on 10/06/2016, held after1 cycle due to  pending surgery at [facility]   10/31/2016 Imaging   CT CAP w contrast at [facility] 10/31/16 Impression: 1.Interval decrease in the size of the ill-defined pancreatic head mass. There is short segment abutment of the portal vein/superior mesenteric vein but no evidence of venous  distortion. 2.No evidence of metastatic disease in the chest, abdomen and pelvis.   11/17/2016 -  Hospital Admission   Patient presents to hospital for nausea and vomitting   11/17/2016 Surgery   pancreatecomy, promximal subtotal with total duodenectomy, partial  GASTRECTOMY, CHOLEDOCHOENTEROSTOMY AND GASTROJEJUNOSTOMY (WHIPPLE-TYPE PROCEDURE); WITH PANCREATOJEJUNOSTOMY   01/19/2017 - 03/02/2017 Chemotherapy   Gemcitabine monotherapy: 1000mg /m2, d1,8 Q21d x2 cycles planned    05/18/2017 Imaging   CT CAP at [facility] 05/18/17 Impression: 1.Status post Whipple with soft tissue density just posterior to the SMA, which may be postsurgical, lymph node, or less likely recurrent disease. Recommend attention on follow-up 2.No evidence of metastatic disease in the chest, abdomen, and pelvis.     09/20/2017 Imaging   CT CAP at [facility] 09/20/17 Impression: 1. Unchanged subcentimeter soft tissue nodule in the Whipple surgical bed, likely a small lymph node. 2. No evidence of metastatic disease in the chest, abdomen, or pelvis.   01/17/2018 Imaging   01/17/2018 CT CAP Impression: No evidence of recurrent or metastatic disease.   06/13/2018 Imaging   CT CAP W Contrast at [place] on 06/13/18 Impression:  1. Status post Whipple procedure with stable findings at the surgical bed. No convincing evidence of recurrent or metastatic disease within the chest, abdomen, or pelvis.  Electronically Reviewed by:  Colton Pollack, MD, [facility] Electronically Reviewed on:  06/13/2018 12:15 PM  I have reviewed the images and concur with the above findings.   11/08/2018 Imaging   CT CAP W Contrast  IMPRESSION: 1. No findings of active malignancy. Prior Whipple procedure. Biliary stent in place. 2. Other imaging findings of potential clinical significance: Thoracic scoliosis. Aortic Atherosclerosis (ICD10-I70.0). Multilevel lumbar impingement.     02/06/2019 Imaging   CT CAP W Contrast IMPRESSION: 1.  Status post Whipple procedure with no definitive findings to suggest metastatic disease in the chest, abdomen or pelvis. 2. Stable tiny pulmonary nodules in the right upper lobe compared to prior examinations, strongly favored to be benign. 3. Aortic atherosclerosis, in addition to left main and 2 vessel coronary artery disease. Please note that although the presence of coronary artery calcium documents the presence of coronary artery disease, the severity of this disease and any potential stenosis cannot be assessed on this non-gated CT examination. Assessment for potential risk factor modification, dietary therapy or pharmacologic therapy may be warranted, if clinically indicated. 4. Additional incidental findings, as above.   04/2019 Procedure   He had PAC removed in 04/2019   08/14/2019 Imaging   CT CAP at [facility] 08/14/19 IMPRESSION: 1.  History of pancreatic adenocarcinoma status post Whipple with stable findings at the surgical bed. 2.  No evidence of metastatic disease in the chest, abdomen, or pelvis. 3.  Arterial phase hyperenhancing 9 mm lesion in subcapsular portion of hepatic segment 8, favor a flash filling hemangioma. Attention on follow-up.   09/04/2019 PET scan   IMPRESSION: 1. Post Whipple procedure without evidence of local pancreatic carcinoma recurrence. 2. No evidence metastatic disease in the liver or periportal lymph nodes. 3. No distant metastatic disease.   11/11/2019 Imaging   CT CAP Impression:   1. Stable appearance of arterially enhancing 9 mm lesion within segment 8  of the liver. Continued attention on  follow-up.  2. No evidence of metastatic disease within the chest, abdomen, or pelvis.  3. Stable appearance of soft tissue density around the common hepatic  artery, for example series 8, image 44. This may represent post-surgical  changes. Continued attention on follow-up.    02/10/2020 Imaging   CT CAP  Impression:  Similar appearance of  ill-defined soft tissue surrounding the common  hepatic artery, favored to be postsurgical. Attention on follow-up.   No definite evidence of metastatic disease.   Previously identified arterially enhancing 9 mm lesion in segment 8 of the  liver not well evaluated on today's study due to lack of arterial phase  imaging, possibly flash-filling hemangioma. Recommend attention on  follow-up.    04/14/2020 Tumor Marker   CA 19-9 at 1124   04/26/2020 Imaging   PET  IMPRESSION: Status post Whipple procedure.   Soft tissue fullness in the porta hepatis with associated mild hypermetabolism, worrisome for recurrence.   No evidence of distant metastases.   05/27/2020 Procedure   EUS by Dr Ardis Hughs  IMPRESSION - I was unable to visualize the region of the porta hepatitis (where the PET avid soft tissue was noted) due to his surgically altered anatomy    06/04/2020 Pathology Results    Liver lesion biopsy  Fine Needle Aspiration by Dr Mariah Milling at [place] bland, hypocellular soft tissue fragments.    See Comment.    Comment:  The patient's prior surgical pathology specimen ([id number]) was reviewed. Evidence of adenocarcinoma is absent in this specimen.  Evaluation is limited by scant cellularity.   A repeat tissue sampling procedure may be helpful, if clinically indicated   08/31/2020 Imaging   CT CAP at [facility]  Impression:   1. Increased recurrent soft tissue along the celiac trunk and common and  proper hepatic arteries with unchanged associated occlusion of the  intrahepatic left portal vein and occlusion of the accessory left hepatic  artery. Increased associated narrowing of the common and proper hepatic  arteries.  2. Increased intrahepatic biliary duct dilation likely due to  aforementioned malignant soft tissue tracking into the liver hilum. Suggest  correlation with lab values for biliary obstruction.    08/31/2020 Imaging   MRI abdomen  Impression:   1. No  parenchymal abnormality in the previously identified area of concern  in the left hepatic lobe.  2. Similar-appearing atrophy of the left hepatic lobe with mildly increased  intrahepatic bile duct dilation.  3. Multifocal areas of arterial enhancement in the liver. At least 2 of  these demonstrate uptake on the hepatobiliary phase. Findings could  represent a combination of focal perfusion anomalies and FNH.  4. Ill-defined soft tissue surrounding the celiac trunk is better assessed  on same day CT.    08/31/2020 Imaging   MRI at [facility]  Impression:   1. No parenchymal abnormality in the previously identified area of concern  in the left hepatic lobe.  2. Similar-appearing atrophy of the left hepatic lobe with mildly increased  intrahepatic bile duct dilation.  3. Multifocal areas of arterial enhancement in the liver. At least 2 of  these demonstrate uptake on the hepatobiliary phase. Findings could  represent a combination of focal perfusion anomalies and FNH.  4. Ill-defined soft tissue surrounding the celiac trunk is better assessed  on same day CT.     08/31/2020 Imaging   CT CAP at [facility]  Impression:   1. Increased recurrent soft tissue along the celiac trunk and common and  proper hepatic arteries with unchanged associated occlusion of the  intrahepatic left portal vein and occlusion of the accessory left hepatic  artery. Increased associated narrowing of the common and proper hepatic  arteries.  2. Increased intrahepatic biliary duct dilation likely due to  aforementioned malignant soft tissue tracking into the liver hilum. Suggest  correlation with lab values for biliary obstruction.    09/24/2020 Pathology Results   Soft tissue around celiac artery by [facility]  Diagnostic Interpretation A  SUSPICIOUS FOR MALIGNANCY.      Comment: The needle core biopsy demonstrates rare, bland glands within fibrovascular tissue with chronic inflammation. Immunohistochemical stains performed on  block A1 in an effort to further characterize the pathologic process show these rare glands to be positive for BerEP4, MOC-31, CK19 and CA 19.9, and negative for CDX-2 and calretinin. The patient's prior pancreatoduodenectomy specimen was also reviewed. Findings are suspicious for involvement by the patient's known pancreaticobiliary primary, but not definitive due to scant cellularity. Multiple step sections were examined in the evaluation of this case. This case was also reviewed at an intradepartmental conference on 09/29/20 and Drs. Delmer Islam, Ninfa Linden, Avani Pendse and Apolonio Schneiders Factor concur.   11/06/2020 Imaging   CT CAP  IMPRESSION: 1. Cluster of bilomas/abscess in the posterior right lobe liver with the largest component measuring up to 4.6 cm. Bile duct dilatation is similar to recent staging scan 08/31/2020. 2. Whipple procedure for pancreas carcinoma with infiltrative soft tissue about the narrowed celiac branches and hepatic hilum, reference dedicated staging scan at [facility] 08/31/2020. 3. No CT findings to correlate with history of pyelonephritis. 4. Airspace disease in the bilateral lower lobes compatible with pneumonia or aspiration. Notable subpleural involvement in the left lower lobe is likely consolidation given the overall pattern but please have low threshold for lower extremity Doppler ultrasound given recent right femur fracture repair. 5. Moderate stool distended rectum with mesorectal edema.     11/07/2020 Imaging   MRI Brain  IMPRESSION: 1. Age indeterminate, mixed left subarachnoid and subdural hematoma measuring 10 mm in thickness. Head CT recommended for better temporal characterization. 2. Areas of suspected acute ischemia in the left temporal and parietal lobes, though diffusion-weighted imaging is complicated by the adjacent blood products and their magnetic susceptibility effects.   11/19/2020 Pathology Results   Clinical History: None provided   Specimen Submitted:  A. PLEURAL FLUID, RIGHT, THORACENTESIS:    FINAL MICROSCOPIC DIAGNOSIS:  - No malignant cells identified    11/25/2020 Imaging   CT AP IMPRESSION: 1. Findings of empyema in the RIGHT chest with associated airspace disease. This has developed since prior imaging of April 9th of 2022 but is decreased in size based on comparison with chest x-ray from November 18, 2020 following aspiration. 2. Hepatic abscesses and or biloma, post drainage now resolved. 3. Post Whipple procedure with soft tissue about the celiac axis and common hepatic artery. Similar appearance of atrophy of the LEFT hepatic lobe. Findings are suspicious for disease recurrence with chronic occlusion of LEFT portal vein and extensive involvement of the celiac axis. 4. Post RIGHT femoral ORIF, recent postoperative changes with mild surrounding stranding, not unexpected and not significantly changed.   12/10/2020 Imaging   CT AP IMPRESSION: 1. Subtle/questionable edema within the medial cortex of the RIGHT kidney, and new fluid stranding overlying the upper pole of the RIGHT kidney. This may represent mild/early pyelonephritis. Recommend correlation with urinalysis. 2. Complex/fluid collection at the RIGHT lung base is partially imaged, more completely imaged  on earlier abdomen CT of 11/25/2020, compatible with previously described empyema. Consider complete characterization with chest CT. 3. No other acute or significant findings within the abdomen or pelvis. No bowel obstruction or evidence of active bowel wall thickening/inflammation. No abscess collection or free intraperitoneal air. 4. Previously placed drainage catheters within the posterior RIGHT liver lobe have been removed. No recurrent abscess.   Aortic Atherosclerosis (ICD10-I70.0).      REVIEW OF SYSTEMS:   Unable to obtain secondary to patient confusion.  I have reviewed the past medical history, past surgical history, social  history and family history with the patient and they are unchanged from previous note.   PHYSICAL EXAMINATION: ECOG PERFORMANCE STATUS: 3 - Symptomatic, >50% confined to bed  Vitals:   01/03/21 0527 01/03/21 0800  BP: (!) 152/83   Pulse: 87   Resp: 18 19  Temp: 97.7 F (36.5 C)   SpO2: 96%    Filed Weights   01/01/21 0500 01/02/21 0500 01/03/21 0500  Weight: 72.2 kg 76 kg 81.4 kg    Intake/Output from previous day: 06/05 0701 - 06/06 0700 In: 420 [I.V.:125; Blood:295] Out: 1500 [Urine:1500]  GENERAL: Chronically ill-appearing male, no distress SKIN: skin color, texture, turgor are normal, no rashes or significant lesions EYES: normal, Conjunctiva are pink and non-injected, sclera clear OROPHARYNX:no exudate, no erythema and lips, buccal mucosa, and tongue normal  LUNGS: clear to auscultation and percussion with normal breathing effort HEART: regular rate & rhythm and no murmurs and no lower extremity edema ABDOMEN:abdomen soft, non-tender and normal bowel sounds  NEURO: Lethargic, answers questions "yes," unable to assess orientation  LABORATORY DATA:  I have reviewed the data as listed CMP Latest Ref Rng & Units 01/03/2021 01/02/2021 01/01/2021  Glucose 70 - 99 mg/dL 348(H) 307(H) 424(H)  BUN 8 - 23 mg/dL 29(H) 42(H) 44(H)  Creatinine 0.61 - 1.24 mg/dL 0.44(L) 0.62 0.60(L)  Sodium 135 - 145 mmol/L 136 137 135  Potassium 3.5 - 5.1 mmol/L 3.3(L) 3.0(L) 3.9  Chloride 98 - 111 mmol/L 104 104 105  CO2 22 - 32 mmol/L 25 27 22   Calcium 8.9 - 10.3 mg/dL 7.7(L) 8.2(L) 8.0(L)  Total Protein 6.5 - 8.1 g/dL 4.8(L) 5.0(L) -  Total Bilirubin 0.3 - 1.2 mg/dL 5.0(H) 3.0(H) -  Alkaline Phos 38 - 126 U/L 820(H) 830(H) -  AST 15 - 41 U/L 38 43(H) -  ALT 0 - 44 U/L 87(H) 126(H) -    Lab Results  Component Value Date   WBC 10.5 01/03/2021   HGB 10.2 (L) 01/03/2021   HCT 30.3 (L) 01/03/2021   MCV 88.6 01/03/2021   PLT 20 (LL) 01/03/2021   NEUTROABS 9.3 (H) 01/03/2021    CT ABDOMEN  PELVIS WO CONTRAST  Result Date: 12/31/2020 CLINICAL DATA:  Abnormal labs and lethargy. EXAM: CT CHEST, ABDOMEN AND PELVIS WITHOUT CONTRAST TECHNIQUE: Multidetector CT imaging of the chest, abdomen and pelvis was performed following the standard protocol without IV contrast. COMPARISON:  None. FINDINGS: CT CHEST FINDINGS Cardiovascular: There is mild calcification of the aortic arch without evidence of aneurysmal dilatation. Normal heart size. No pericardial effusion. Mediastinum/Nodes: No enlarged mediastinal, hilar, or axillary lymph nodes. Thyroid gland, trachea, and esophagus demonstrate no significant findings. Lungs/Pleura: Mild to moderate severity atelectasis and/or infiltrate is seen within the superior segment of the right lower lobe and posterior aspect of the right lung base. This is decreased in severity when compared to the prior study. A trace amount of right pleural effusion is noted which  is also decreased in size when compared to the prior exam. No pneumothorax is identified. Musculoskeletal: A metallic density fusion plate and screws are seen along the anterior aspect of the lower cervical spine. Multilevel degenerative changes seen throughout the thoracic spine. CT ABDOMEN PELVIS FINDINGS Hepatobiliary: Multiple heterogeneous low-attenuation liver lesions are seen. The largest measures approximately 3.6 cm x 3.7 cm and is located within the posterior aspect of the right lobe of the liver. These areas are increased in size and number when compared to the prior study. Status post cholecystectomy. No biliary dilatation. Pancreas: Unremarkable. No pancreatic ductal dilatation or surrounding inflammatory changes. Spleen: Normal in size without focal abnormality. Adrenals/Urinary Tract: Adrenal glands are unremarkable. Kidneys are normal, without renal calculi, focal lesion, or hydronephrosis. A small amount of air is seen within the lumen of an otherwise normal appearing urinary bladder.  Stomach/Bowel: Surgical sutures are seen within the gastric region. The appendix is not clearly identified. No evidence of bowel wall thickening, distention, or inflammatory changes. Vascular/Lymphatic: Aortic atherosclerosis. No enlarged abdominal or pelvic lymph nodes. Reproductive: Prostate is unremarkable. Other: No abdominal wall hernia or abnormality. No abdominopelvic ascites. Musculoskeletal: A metallic density intramedullary rod and compression screw device are seen within the proximal right femur. Multilevel degenerative changes seen throughout the lumbar spine. IMPRESSION: 1. Low-attenuation liver lesions which may represent multiple hepatic abscesses. Sequelae associated with metastatic disease cannot be excluded. 2. Mild to moderate severity right lower lobe atelectasis and/or infiltrate, decreased in severity when compared to the prior study. 3. Trace right pleural effusion. 4. Evidence of prior cholecystectomy. Electronically Signed   By: Virgina Norfolk M.D.   On: 12/31/2020 15:52   DG Chest 1 View  Result Date: 12/31/2020 CLINICAL DATA:  Leukocytosis. EXAM: CHEST  1 VIEW COMPARISON:  Dec 16, 1918 22. May 18 chest radiograph. May 19 chest CT. FINDINGS: Similar versus mildly improved opacities in the right midlung, likely relating to pleural fluid/empyema and overlying atelectasis or pneumonia, better characterized on prior CT chest. No new consolidation. No visible pneumothorax on this limited AP supine radiograph. Similar elevation right hemidiaphragm. Similar cardiomediastinal silhouette. Partially imaged cervical fusion hardware. IMPRESSION: Similar versus mildly improved opacities in the right midlung, likely relating to pleural fluid/empyema and overlying atelectasis or pneumonia, better characterized on prior CT chest. A repeat CT chest with contrast could allow for more sensitive evaluation for change if clinically indicated. Electronically Signed   By: Margaretha Sheffield MD   On:  12/31/2020 12:29   DG Chest 2 View  Result Date: 12/13/2020 CLINICAL DATA:  Weakness EXAM: CHEST - 2 VIEW COMPARISON:  October 09, 2020 FINDINGS: Right upper extremity PICC with tip overlying the superior cavoatrial junction. The heart size and mediastinal contours are within normal limits. Increased conspicuity of the they density overlying the right midlung. Left lung is clear. The visualized skeletal structures are unremarkable. IMPRESSION: Increased conspicuity of they density overlying the right midlung, corresponding with the posterior loculated fluid collection. Electronically Signed   By: Dahlia Bailiff MD   On: 12/13/2020 21:07   DG Chest 2 View  Result Date: 12/09/2020 CLINICAL DATA:  Weakness EXAM: CHEST - 2 VIEW COMPARISON:  11/19/2020 FINDINGS: Cardiac shadow is within normal limits. Patchy airspace opacity is again seen in the right mid lung stable in appearance from the prior exam consistent with some loculated pleural fluid along the posterior margin. Right-sided PICC line is noted in satisfactory position at the cavoatrial junction. The left lung is clear. No bony abnormality  is noted. IMPRESSION: Right-sided PICC line in satisfactory position. Vague density overlying the right mid lung which corresponds to posterior loculated fluid. Electronically Signed   By: Inez Catalina M.D.   On: 12/09/2020 17:37   CT Head Wo Contrast  Result Date: 12/31/2020 CLINICAL DATA:  Confusion.  History of pancreatic cancer EXAM: CT HEAD WITHOUT CONTRAST TECHNIQUE: Contiguous axial images were obtained from the base of the skull through the vertex without intravenous contrast. COMPARISON:  CT head 12/13/2020 FINDINGS: Brain: Mild ventricular enlargement unchanged likely due to atrophy. Mild periventricular white matter hypodensity bilaterally. Mild dural thickening or isodense fluid collection left frontal region measuring 3 mm unchanged from the prior study. No acute infarct or mass. Vascular: Negative for  hyperdense vessel Skull: Left frontal burr hole.  No acute abnormality. Sinuses/Orbits: Negative Other: None IMPRESSION: 3 mm thickening of the left frontal dura versus isodense fluid collection, unchanged from the prior study. No new hemorrhage. Electronically Signed   By: Franchot Gallo M.D.   On: 12/31/2020 12:56   CT Head Wo Contrast  Result Date: 12/13/2020 CLINICAL DATA:  Head trauma fall EXAM: CT HEAD WITHOUT CONTRAST TECHNIQUE: Contiguous axial images were obtained from the base of the skull through the vertex without intravenous contrast. COMPARISON:  CT brain 12/10/2020 FINDINGS: Brain: No acute territorial infarction, mass or new hemorrhage is visualized. Left hemispheric subdural hematoma without significant change since 12/10/2020, measures 3 mm maximum. No midline shift. Mild atrophy and chronic small vessel ischemic changes of the white matter. Vascular: No hyperdense vessels.  No unexpected calcification Skull: Left frontal burr hole.  No fracture Sinuses/Orbits: No acute finding. Other: None IMPRESSION: 1. No significant change in size or appearance of thin left convexity subdural hematoma without significant mass effect or midline shift. No acute interval change since 12/10/2020. 2. Atrophy and chronic small vessel ischemic change of the white matter Electronically Signed   By: Donavan Foil M.D.   On: 12/13/2020 22:05   CT Head Wo Contrast  Result Date: 12/10/2020 CLINICAL DATA:  Altered mental status EXAM: CT HEAD WITHOUT CONTRAST TECHNIQUE: Contiguous axial images were obtained from the base of the skull through the vertex without intravenous contrast. COMPARISON:  11/07/2020 FINDINGS: Brain: Previously seen left subdural hematoma is decreased since prior study, now measuring 3 mm in thickness compared with 10 mm previously. No mass effect or midline shift. No new hemorrhage. No acute infarct. No hydrocephalus. Vascular: No hyperdense vessel or unexpected calcification. Skull: No acute  calvarial abnormality. Burr hole in the left frontal region. Sinuses/Orbits: No acute findings Other: None IMPRESSION: Small residual left subdural hematoma, decreased significantly in size since prior study, now 3 mm in thickness maximally. No mass effect or midline shift. Electronically Signed   By: Rolm Baptise M.D.   On: 12/10/2020 09:36   CT Chest Wo Contrast  Result Date: 12/31/2020 CLINICAL DATA:  Abnormal labs and lethargy. EXAM: CT CHEST, ABDOMEN AND PELVIS WITHOUT CONTRAST TECHNIQUE: Multidetector CT imaging of the chest, abdomen and pelvis was performed following the standard protocol without IV contrast. COMPARISON:  Dec 16, 2020 FINDINGS: CT CHEST FINDINGS Cardiovascular: There is mild calcification of the aortic arch without evidence of aneurysmal dilatation. Normal heart size. No pericardial effusion. Mediastinum/Nodes: No enlarged mediastinal, hilar, or axillary lymph nodes. Thyroid gland, trachea, and esophagus demonstrate no significant findings. Lungs/Pleura: Mild to moderate severity atelectasis and/or infiltrate is seen within the superior segment of the right lower lobe and posterior aspect of the right lung base. This is  decreased in severity when compared to the prior study. A trace amount of right pleural effusion is noted which is also decreased in size when compared to the prior exam. No pneumothorax is identified. Musculoskeletal: A metallic density fusion plate and screws are seen along the anterior aspect of the lower cervical spine. Multilevel degenerative changes seen throughout the thoracic spine. CT ABDOMEN PELVIS FINDINGS Hepatobiliary: Multiple heterogeneous low-attenuation liver lesions are seen. The largest measures approximately 3.6 cm x 3.7 cm and is located within the posterior aspect of the right lobe of the liver. These areas are increased in size and number when compared to the prior study. Status post cholecystectomy. No biliary dilatation. Pancreas: Unremarkable. No  pancreatic ductal dilatation or surrounding inflammatory changes. Spleen: Normal in size without focal abnormality. Adrenals/Urinary Tract: Adrenal glands are unremarkable. Kidneys are normal, without renal calculi, focal lesion, or hydronephrosis. A small amount of air is seen within the lumen of an otherwise normal appearing urinary bladder. Stomach/Bowel: Surgical sutures are seen within the gastric region. The appendix is not clearly identified. No evidence of bowel wall thickening, distention, or inflammatory changes. Vascular/Lymphatic: Aortic atherosclerosis. No enlarged abdominal or pelvic lymph nodes. Reproductive: Prostate is unremarkable. Other: No abdominal wall hernia or abnormality. No abdominopelvic ascites. Musculoskeletal: A metallic density intramedullary rod and compression screw device are seen within the proximal right femur. Multilevel degenerative changes seen throughout the lumbar spine. IMPRESSION: 1. Low-attenuation liver lesions which may represent multiple hepatic abscesses. Sequelae associated with metastatic disease cannot be excluded. 2. Mild to moderate severity right lower lobe atelectasis and/or infiltrate, decreased in severity when compared to the prior study. 3. Trace right pleural effusion. 4. Evidence of prior cholecystectomy. Electronically Signed   By: Virgina Norfolk M.D.   On: 12/31/2020 15:52   CT CHEST WO CONTRAST  Result Date: 12/16/2020 CLINICAL DATA:  Evaluate empyema EXAM: CT CHEST WITHOUT CONTRAST TECHNIQUE: Multidetector CT imaging of the chest was performed following the standard protocol without IV contrast. COMPARISON:  CT of the abdomen and pelvis from 12/11/2018, chest x-ray from the previous day. FINDINGS: Cardiovascular: Somewhat limited due to lack of IV contrast. Aortic calcifications are noted. Coronary calcifications are seen. Right-sided PICC line is noted in satisfactory position. Mediastinum/Nodes: Thoracic inlet is within normal limits. The  esophagus as visualized is within normal limits. No sizable hilar or mediastinal adenopathy is noted. Lungs/Pleura: Lungs are well aerated bilaterally. The left lung shows only minimal scarring in the base. Right lung shows some lower lobe atelectasis which is increased slightly in the interval from the prior CT of 12/10/2020. Additionally, small right-sided pleural effusion is noted which is increased slightly in the interval from the prior exam. It has some thickened margins suggestive of empyema. The thickened margins suggest underlying empyema. A small focus of air is noted within which may be related to prior thoracentesis. No pneumothorax is seen. Upper Abdomen: Postsurgical changes are noted in the stomach. No other focal abnormality is noted in the upper abdomen. Previously seen fluid collection in the right liver posteriorly is not well appreciated on today's exam. Musculoskeletal: Degenerative changes of the thoracic spine are noted. No acute rib abnormality is seen. Postsurgical changes in the cervical spine are noted. No compression deformities are seen. IMPRESSION: Small fluid collection in the posterior aspect of the right pleural space with thickened margins most consistent with a small empyema. A small focus of air is noted within likely related to prior intervention. Slight increase in lower lobe atelectasis on the right  when compared with the prior exam. No other acute abnormality is noted. Aortic Atherosclerosis (ICD10-I70.0). Electronically Signed   By: Inez Catalina M.D.   On: 12/16/2020 15:43   CT ABDOMEN PELVIS W CONTRAST  Result Date: 12/10/2020 CLINICAL DATA:  Generalized weakness. Infection suspected rule out abscess. EXAM: CT ABDOMEN AND PELVIS WITH CONTRAST TECHNIQUE: Multidetector CT imaging of the abdomen and pelvis was performed using the standard protocol following bolus administration of intravenous contrast. CONTRAST:  61mL OMNIPAQUE IOHEXOL 300 MG/ML  SOLN COMPARISON:  CT  abdomen dated 11/25/2020. CT abdomen dated 11/06/2020. FINDINGS: Lower chest: Complex/fluid collection at the RIGHT lung base is partially imaged. LEFT lung bases clear. Hepatobiliary: Status post cholecystectomy. Stable intrahepatic and extrahepatic bile duct dilatation. RIGHT hepatic drainage catheters the abdomen removed. No recurrent abscess. Small residual fluid stranding underlying the RIGHT liver lobe. Pancreas: Atrophic.  No acute findings. Spleen: Normal in size without focal abnormality. Adrenals/Urinary Tract: Adrenal glands appear normal. Subtle/questionable edema within the medial cortex of the RIGHT kidney. New fluid stranding overlying the upper pole of the RIGHT kidney. No renal stone or hydronephrosis bilaterally. Foley catheter within the bladder. Stomach/Bowel: No dilated large or small bowel loops. Surgical anastomosis at the stomach antrum, stable. No evidence of active bowel wall thickening/inflammation. Appendix is unremarkable. Vascular/Lymphatic: Aortic atherosclerosis. No acute-appearing vascular abnormality. No enlarged lymph nodes are seen. Reproductive: Prostate is unremarkable. Other: No abscess collection or free intraperitoneal air. Musculoskeletal: No acute-appearing osseous abnormality. Degenerative spondylosis of the thoracolumbar spine, mild to moderate in degree. Superficial soft tissues of the abdomen and and pelvis are unremarkable. IMPRESSION: 1. Subtle/questionable edema within the medial cortex of the RIGHT kidney, and new fluid stranding overlying the upper pole of the RIGHT kidney. This may represent mild/early pyelonephritis. Recommend correlation with urinalysis. 2. Complex/fluid collection at the RIGHT lung base is partially imaged, more completely imaged on earlier abdomen CT of 11/25/2020, compatible with previously described empyema. Consider complete characterization with chest CT. 3. No other acute or significant findings within the abdomen or pelvis. No bowel  obstruction or evidence of active bowel wall thickening/inflammation. No abscess collection or free intraperitoneal air. 4. Previously placed drainage catheters within the posterior RIGHT liver lobe have been removed. No recurrent abscess. Aortic Atherosclerosis (ICD10-I70.0). Electronically Signed   By: Franki Cabot M.D.   On: 12/10/2020 12:07   DG Chest Port 1 View  Result Date: 12/15/2020 CLINICAL DATA:  sob EXAM: PORTABLE CHEST 1 VIEW COMPARISON:  Radiograph 12/13/2020, CT 12/10/2020 FINDINGS: Unchanged cardiomediastinal silhouette. Unchanged right upper extremity PICC. Unchanged opacity overlying the right mid lung corresponding to the known posterior loculated pleural collection. There is no new airspace disease. Bones are unchanged. IMPRESSION: Unchanged right midlung opacity corresponding to known right pleural collection. No new airspace disease. Electronically Signed   By: Maurine Simmering   On: 12/15/2020 12:52   DG Knee Complete 4 Views Right  Result Date: 12/13/2020 CLINICAL DATA:  Fall with right knee swelling EXAM: RIGHT KNEE - COMPLETE 4+ VIEW COMPARISON:  October 19, 2020 FINDINGS: Partially visualized femoral intramedullary rod and screw fixation without evidence of hardware loosening or perihardware fracture within the visualized portions. No evidence of acute fracture or dislocation. Small joint effusion. Severe tricompartment degenerative change with chondrocalcinosis similar prior. Deformity of the proximal right fibula consistent with remote healed fracture. IMPRESSION: 1. No acute fracture or dislocation. 2. Small joint effusion. 3. Severe tricompartment degenerative change with chondrocalcinosis. Electronically Signed   By: Dahlia Bailiff MD   On:  12/13/2020 21:01   [place] EKG SITE RITE  Result Date: 12/06/2020 If Site Rite image not attached, placement could not be confirmed due to current cardiac rhythm.   ASSESSMENT AND PLAN: 1.  Locally recurrent pancreatic adenocarcinoma 2.   Klebsiella bacteremia with early gram-negative sepsis 3.  Liver abscesses versus liver mets 4.  Thrombocytopenia 5.  Anemia 6.  Coagulopathy 7.  Transaminitis and hyperbilirubinemia 8.  Diabetes mellitus 9.  History of subdural hematoma/seizure disorder 10.  BPH  -Mr. Maka has locally recurrent pancreatic adenocarcinoma although CT of the abdomen/pelvis without contrast on admission cannot rule out liver mets. -Based on current condition and poor performance status, he is not a candidate for systemic chemotherapy. -He was scheduled to begin radiation for his locally advanced pancreatic cancer.  This will need to be held due to bacteremia/early sepsis. -ID has seen patient and appreciate their recommendations for antibiotics. -He developed severe thrombocytopenia this admission.  No schistocytes seen on smear.  Fibrinogen was normal.  Likely due to acute infection/sepsis and medications.  Transfuse platelets for platelet count less than 10,000 or active bleeding. -Transfuse PRBCs for hemoglobin less than 7.5. -INR noted to be elevated likely due to severe infection/liver involvement of abscesses versus mets.  Also likely a component of malnutrition.  Administer vitamin K as needed. -Monitor liver function closely. -Agree with goals of care discussion.  He would not be a candidate for chemotherapy at this time due to poor performance status.   LOS: 3 days   Mikey Bussing, DNP, AGPCNP-BC, AOCNP 01/03/21  Addendum  I have seen the patient, examined him. I agree with the assessment and and plan and have edited the notes.   Patient is lethargic, still recognizes and oriented to place.  I reviewed his lab and CT scan findings, unfortunately he has developed bacteremia again, with multiple liver lesions, abscess versus metastatic pancreatic cancer.  Given his overall health has gone downhill, he is not a candidate for radiation or chemotherapy.  Dr. Posey Pronto has discussed with his wife about  hospice, and she agrees.  I think hospice is very reasonable, appreciate our hospitalist and palliative care teams excellent care.  I will follow-up as needed.  We have informed radiation oncology to cancel his appointment.  I called his wife, and left her a detailed message about the above.  She knows to call me back if needed.  Truitt Merle  01/03/2021

## 2021-01-03 NOTE — Progress Notes (Signed)
Inpatient Diabetes Program Recommendations  AACE/ADA: New Consensus Statement on Inpatient Glycemic Control (2015)  Target Ranges:  Prepandial:   less than 140 mg/dL      Peak postprandial:   less than 180 mg/dL (1-2 hours)      Critically ill patients:  140 - 180 mg/dL   Lab Results  Component Value Date   GLUCAP 318 (H) 01/03/2021   HGBA1C 10.8 (H) 10/18/2020    Review of Glycemic Control Results for Colton Bush, Colton "[name]" (MRN [id number]) as of 01/03/2021 10:06  Ref. Range 01/02/2021 07:47 01/02/2021 11:40 01/02/2021 16:49 01/02/2021 21:17 01/03/2021 07:34  Glucose-Capillary Latest Ref Range: 70 - 99 mg/dL 276 (H) 284 (H) 188 (H) 306 (H) 318 (H)   Diabetes history: DM 2 Outpatient Diabetes medications: Levemir 14 units, Novolog 2-13 units tid Current orders for Inpatient glycemic control:  Novolog 0-15 units tid + hs  Inpatient Diabetes Program Recommendations:    If in plan of care consider: -  Start Levemir 14 units  Thanks,  Tama Headings RN, MSN, BC-ADM Inpatient Diabetes Coordinator Team Pager [phone] (8a-5p)

## 2021-01-04 ENCOUNTER — Ambulatory Visit: Payer: Medicare Other | Attending: Radiation Oncology

## 2021-01-04 ENCOUNTER — Ambulatory Visit: Payer: Medicare Other | Admitting: Radiation Oncology

## 2021-01-04 DIAGNOSIS — Z7189 Other specified counseling: Secondary | ICD-10-CM

## 2021-01-04 DIAGNOSIS — R531 Weakness: Secondary | ICD-10-CM

## 2021-01-04 DIAGNOSIS — Z515 Encounter for palliative care: Secondary | ICD-10-CM

## 2021-01-04 NOTE — Progress Notes (Signed)
[facility] Progress Note  Patient: Colton Bush    WCB:[id number]  DOA: 12/31/2020     Date of Service: the patient was seen and examined on 01/04/2021  Brief hospital course: Past medical history of HTN, HLD, recurrent pancreatic cancer, recent empyema as well as recurrent hepatic abscess.  Presents with complaints of nausea vomiting and found to have recurrence of his hepatic abscess as well as Klebsiella bacteremia. Currently plan is for comfort care.  Assessment and Plan: 1.  Recurrent hepatic abscess. Klebsiella bacteremia Early gram-negative sepsis Treated with IV ceftriaxone and Flagyl in the past. Also had IR guided drain placement. CT scan shows evidence of reoccurrence of the abscess as well as increasing numbers. Appreciate IR assistance.  Currently conservative measures recommended.  Repeat CT scan recommended for 6/6. Currently on IV ceftriaxone and Flagyl as well as Zyvox. ID consulted.  Appreciate assistance. Concern is with the patient will continue to have recurrent abscesses and infection due to his immunosuppressed status from pancreatic cancer. Extensive discussion with the wife at bedside on 6/6.  Currently transitioning to comfort care based on the discussion. Likely will need residential hospice.   2.  Adenocarcinoma of the head of the pancreas. Prior history of Whipple's procedure SP chemo and radiation. Current recurrence is nonresectable. Radiation was planned. Has not been able to get any therapy due to recurrent infection.  Now comfort care  3. Type 2 Diabetes Mellitus,  uncontrolled with hyperglycemia without  Complication  Was On insulin sliding scale  Now comfort care  4.  History of SDH, seizure disorder On Keppra.  We will continue.  Switch to IV Required bur hole surgery on 4/12.  5.  BPH with chronic indwelling Foley catheter. At risk for recurrent infection. Continue for comfort  6.  Goals of care conversation. Patient has  frequent admissions during the hospitalization requiring multiple antibiotics due to infection. With his pancreatic cancer history prognosis is poor regardless of infection status. Appreciate palliative care consultation. 6/6, I discussed with wife at bedside.  Currently agreeable to transition to comfort care.  She has concern regarding use of medication for hastening the process of dying and I have reassured her that medications are only going to be used for symptom control. Unrestricted visitation status.  7.  Acute thrombocytopenia Likely in the setting of infection. Continues to trend down.  Suspect in the setting of acute infection. Received 1 platelet transfusion   8.  Hypokalemia. replaced.  9.  Chronically elevated LFT. Secondary to his history of pancreatic cancer.  10.  Coagulopathy. In the setting of severe infection. X1 IV vitamin K was given to facilitate IR guided drainage. DIC is in the differential in the setting of gram-negative bacteremia as well as pancreatic malignancy.  Smear negative for schistocytes. Fibrinogen normal No evidence of bleeding. No evidence of thrombosis for now. Now comfort care  Diet: Dysphagia 1 diet thin liquid DVT Prophylaxis:   SCDs Start: 12/31/20 1654   Advance goals of care discussion: DNR/DNI, now comfort care.  Family Communication: no family was present at bedside, at the time of interview.  Disposition:  Status is: Inpatient  Remains inpatient appropriate because: likely hospital death but may need residential hospice  Dispo: The patient is from: SNF              Anticipated d/c is to: residential hospice vs hospital death              Patient currently is not medically stable to  d/c.   Difficult to place patient No  Subjective: appears comfortable, no acute events, alerts but incoherent speech.   Physical Exam:  General: Appear in mild distress, no Rash; Oral Mucosa Clear, dry Cardiovascular: S1 and S2 Present, no  Murmur, Respiratory: good respiratory effort, Bilateral Air entry present and CTA, no Crackles, no wheezes Abdomen: Bowel Sound present Extremities: no Pedal edema Neurology: alert and oriented person affect flat Gait not checked due to patient safety concerns  Vitals:   01/03/21 1200 01/03/21 1600 01/04/21 0500 01/04/21 1843  BP:    (!) 162/95  Pulse:    92  Resp: 13 20  15   Temp:    97.9 F (36.6 C)  TempSrc:      SpO2:    97%  Weight:   80.9 kg   Height:        Intake/Output Summary (Last 24 hours) at 01/04/2021 1902 Last data filed at 01/04/2021 6314 Gross per 24 hour  Intake 240 ml  Output 2675 ml  Net -2435 ml   Filed Weights   01/02/21 0500 01/03/21 0500 01/04/21 0500  Weight: 76 kg 81.4 kg 80.9 kg    Data Reviewed: I have personally reviewed and interpreted daily labs, tele strips, imaging. I reviewed all nursing notes, pharmacy notes, vitals, pertinent old records I have discussed plan of care as described above with RN and patient/family.  CBC: Recent Labs  Lab 12/31/20 1128 01/01/21 0557 01/02/21 1032 01/02/21 1839 01/03/21 0713  WBC 16.3* 24.4* 20.7* 16.1* 10.5  NEUTROABS 15.5*  --  19.3* 14.8* 9.3*  HGB 11.1* 10.4* 10.8* 10.8* 10.2*  HCT 33.6* 31.1* 33.0* 32.1* 30.3*  MCV 89.6 89.9 90.2 89.2 88.6  PLT 83* 39* 11* 9* 20*   Basic Metabolic Panel: Recent Labs  Lab 12/31/20 1128 12/31/20 1242 01/01/21 0557 01/01/21 1820 01/02/21 1032 01/03/21 0713  NA 138  --  138 135 137 136  K 2.7*  --  3.0* 3.9 3.0* 3.3*  CL 104  --  104 105 104 104  CO2 25  --  21* 22 27 25   GLUCOSE 115*  --  171* 424* 307* 348*  BUN 27*  --  30* 44* 42* 29*  CREATININE 0.55*  --  0.59* 0.60* 0.62 0.44*  CALCIUM 8.6*  --  8.3* 8.0* 8.2* 7.7*  MG  --  2.0  --   --   --  1.7    Studies: No results found.  Scheduled Meds:  Continuous Infusions: . lactated ringers 10 mL/hr at 01/03/21 1800  . levETIRAcetam 1,000 mg (01/04/21 0947)   PRN Meds: acetaminophen **OR**  acetaminophen, glycopyrrolate **OR** glycopyrrolate **OR** glycopyrrolate, HYDROmorphone (DILAUDID) injection, LORazepam **OR** LORazepam **OR** LORazepam, magic mouthwash, morphine CONCENTRATE **OR** morphine CONCENTRATE, ondansetron **OR** ondansetron (ZOFRAN) IV  Time spent: 35 minutes  Author: Berle Mull, MD [facility] 01/04/2021 7:02 PM  To reach On-call, see care teams to locate the attending and reach out via [email or web address]. Between 7PM-7AM, please contact night-coverage If you still have difficulty reaching the attending provider, please page the [facility] (Director on Call) for [facility] on amion for assistance.

## 2021-01-04 NOTE — Progress Notes (Signed)
Daily Progress Note   Patient Name: Colton Bush       Date: 01/04/2021 DOB: 08/09/1955  Age: 65 y.o. MRN#: [id number] Attending Physician: Lavina Hamman, MD Primary Care Physician: [name], MD Admit Date: 12/31/2020  Reason for Consultation/Follow-up: Establishing goals of care  Subjective: Chart reviewed, patient seen and examined.  He is awake reasonably alert sitting up in bed.  He asks for some help with his breakfast.  He only took 2 bites of grits and a few sips of orange juice and milk.  Ate about 20% of his breakfast.  Complains of generalized pain.  He states that he is here in the hospital because he has pancreatic cancer that came back.  He states that he lives at home with his wife, he has a son.  We discussed current plan of care being to continue efforts at being as comfortable as possible going forward. Call placed, unable to reach wife.   Length of Stay: 4  Current Medications: Scheduled Meds:    Continuous Infusions: . lactated ringers 10 mL/hr at 01/03/21 1800  . levETIRAcetam 1,000 mg (01/03/21 2049)    PRN Meds: acetaminophen **OR** acetaminophen, glycopyrrolate **OR** glycopyrrolate **OR** glycopyrrolate, HYDROmorphone (DILAUDID) injection, LORazepam **OR** LORazepam **OR** LORazepam, magic mouthwash, morphine CONCENTRATE **OR** morphine CONCENTRATE, ondansetron **OR** ondansetron (ZOFRAN) IV  Physical Exam         General: Sleepy but arousable, in no acute distress.  Confused.  HEENT: No bruits, no goiter, no JVD Heart: Regular rate and rhythm. No murmur appreciated. Lungs: Decreased air movement, clear Abdomen: Soft, nontender, nondistended, positive bowel sounds.  Ext: No significant edema Skin: Warm and dry  Vital Signs: BP (!) 152/83 (BP  Location: Left Arm)   Pulse 87   Temp 97.7 F (36.5 C) (Oral)   Resp 20   Ht 5\' 11"  (1.803 m)   Wt 80.9 kg   SpO2 96%   BMI 24.88 kg/m  SpO2: SpO2: 96 % O2 Device: O2 Device: Room Air O2 Flow Rate:    Intake/output summary:   Intake/Output Summary (Last 24 hours) at 01/04/2021 0939 Last data filed at 01/04/2021 0600 Gross per 24 hour  Intake 599.05 ml  Output 1850 ml  Net -1250.95 ml   LBM: Last BM Date: 01/03/21 Baseline Weight: Weight: 70 kg Most recent  weight: Weight: 80.9 kg       Palliative Assessment/Data:    Flowsheet Rows   Flowsheet Row Most Recent Value  Intake Tab   Referral Department Hospitalist  Unit at Time of Referral Med/Surg Unit  Palliative Care Primary Diagnosis Cancer  Date Notified 12/31/20  Palliative Care Type New Palliative care  Reason for referral Clarify Goals of Care  Date of Admission 12/31/20  Date first seen by Palliative Care 01/02/21  # of days Palliative referral response time 2 Day(s)  # of days IP prior to Palliative referral 0  Clinical Assessment   Palliative Performance Scale Score 30%  Psychosocial & Spiritual Assessment   Palliative Care Outcomes   Patient/Family meeting held? Yes  Who was at the meeting? Patient and wife      Patient Active Problem List   Diagnosis Date Noted  . Bacteremia due to Klebsiella pneumoniae 01/01/2021  . Leukocytosis 01/01/2021  . Thrombocytopenia ([place]) 01/01/2021  . Empyema ([place]) 01/01/2021  . Hepatic abscess 12/31/2020  . Anemia of chronic illness 12/09/2020  . Hypokalemia 12/09/2020  . Nausea & vomiting   . Pain   . Bladder outlet obstruction 11/12/2020  . Toxic encephalopathy 11/12/2020  . Atrophic pancreas 11/12/2020  . Empyema lung ([place])   . Subdural hematoma ([place])   . Pneumonia of both lower lobes due to infectious organism 11/06/2020  . Liver abscess 11/06/2020  . Sepsis ([place]) 11/05/2020  . Uncontrolled type 2 diabetes mellitus with ketoacidosis without coma, with  long-term current use of insulin ([place]) 11/05/2020  . Increased anion gap metabolic acidosis 53/61/4431  . Chronic diastolic CHF (congestive heart failure) ([place]) 11/05/2020  . Closed right hip fracture, initial encounter ([place]) 10/18/2020  . Fall from ground level 10/18/2020  . Hypoglycemia 06/19/2017  . Hypothermia 06/19/2017  . Goals of care, counseling/discussion 09/29/2016  . Port catheter in place 04/11/2016  . Hypercalcemia 03/29/2016  . Adenocarcinoma of head of pancreas ([place]) 03/17/2016  . Biliary obstruction   . Obstructive jaundice due to malignant neoplasm ([place]) 02/27/2016  . DKA (diabetic ketoacidosis) ([place]) 02/27/2016  . Mixed diabetic hyperlipidemia associated with type 2 diabetes mellitus ([place]) 05/05/2014  . Cervical spondylosis with myelopathy 08/18/2011  . CERUMEN IMPACTION 12/14/2008  . Diabetes ([place]) 09/16/2007  . Hyperlipidemia, mixed 09/16/2007  . Essential hypertension 09/16/2007  . GERD without esophagitis 09/16/2007  . ESOPHAGEAL STRICTURE 04/04/2007  . HIATAL HERNIA 04/04/2007    Palliative Care Assessment & Plan   Patient Profile: 65 y.o. male  with past medical history of hypertension, hyperlipidemia, recurrent pancreatic cancer, recent empyema and recurrent hepatic abscess admitted on 12/31/2020 with complaints of nausea and vomiting and found to have recurrence of his hepatic abscess as well as Klebsiella bacteremia.  Palliative consulted for goals of care.  Recommendations/Plan:  DNR/DNI  Plan for comfort moving forward.    Visitation per end of life policy.  Goals of Care and Additional Recommendations:  Limitations on Scope of Treatment: Full Comfort Care  Code Status:    Code Status Orders  (From admission, onward)         Start     Ordered   01/03/21 1126  Do not attempt resuscitation (DNR)  Continuous       Question Answer Comment  In the event of cardiac or respiratory ARREST Do not call a "code blue"   In the event of cardiac or  respiratory ARREST Do not perform Intubation, CPR, defibrillation or ACLS   In the event of cardiac or  respiratory ARREST Use medication by any route, position, wound care, and other measures to relive pain and suffering. May use oxygen, suction and manual treatment of airway obstruction as needed for comfort.      01/03/21 1126        Code Status History    Date Active Date Inactive Code Status Order ID Comments User Context   01/02/2021 1650 01/03/2021 1126 DNR [id number]  Micheline Rough, MD Inpatient   12/31/2020 1655 01/02/2021 1650 Full Code [id number]  Lavina Hamman, MD ED   12/15/2020 1609 12/22/2020 2006 Full Code [id number]  Norval Morton, MD ED   11/12/2020 1820 12/08/2020 1821 Full Code [id number]  Bary Leriche, PA-C Inpatient   11/05/2020 2348 11/12/2020 1756 Full Code [id number]  Vernelle Emerald, MD ED   10/18/2020 2223 10/28/2020 1955 Full Code [id number]  Doran Heater, DO ED   06/19/2017 0232 06/20/2017 2149 Full Code [id number]  Rise Patience, MD ED   09/19/2016 1425 09/25/2016 2116 Full Code [id number]  Stark Klein, MD Inpatient   02/27/2016 2042 03/02/2016 1607 Full Code [id number]  Benito Mccreedy, MD Inpatient   02/27/2016 2042 02/27/2016 2042 Full Code [id number]  Benito Mccreedy, MD Inpatient   08/18/2011 2216 08/23/2011 2339 Full Code [id number]  Theone Stanley, RN Inpatient   Advance Care Planning Activity       Prognosis:   Likely days.  He has recurrent infection with abscesses of liver.  With plan for comfort, I believe this will progress rapidly.  Discharge Planning:  To Be Determined- ? Hospital death Would consider residential hospice in 1-2 days if patient appears appropriate to tolerate transfer, continue comfort measures.   Care plan was discussed with patient.  Call placed, unable to reach wife, will re attempt on 6-8.  Thank you for allowing the Palliative Medicine Team to assist in the care of this patient.   Time In: 9 Time Out: 9.25 Total  Time 25 Prolonged Time Billed No      Greater than 50%  of this time was spent counseling and coordinating care related to the above assessment and plan.  Loistine Chance, MD  Please contact Palliative Medicine Team phone at [phone] for questions and concerns.

## 2021-01-05 ENCOUNTER — Ambulatory Visit: Payer: Medicare Other

## 2021-01-05 MED ORDER — CEPHALEXIN 500 MG PO CAPS: 500.0000 mg | ORAL_CAPSULE | Freq: Three times a day (TID) | ORAL | Status: DC

## 2021-01-05 MED ORDER — LEVETIRACETAM 500 MG PO TABS: 1000.0000 mg | ORAL_TABLET | Freq: Two times a day (BID) | ORAL | Status: DC

## 2021-01-05 MED ORDER — METRONIDAZOLE 500 MG PO TABS: 500.0000 mg | ORAL_TABLET | Freq: Three times a day (TID) | ORAL | Status: DC

## 2021-01-05 MED ORDER — LEVETIRACETAM 100 MG/ML PO SOLN
1000.0000 mg | Freq: Two times a day (BID) | ORAL | Status: DC
  Administered 2021-01-05 – 2021-01-11 (×12): 1000 mg via ORAL
  Filled 2021-01-05 (×13): qty 10

## 2021-01-05 NOTE — Progress Notes (Signed)
PROGRESS NOTE    Colton Bush  OJJ:[id number] DOB: Dec 17, 1955 DOA: 12/31/2020 PCP: [name], MD    Chief Complaint  Patient presents with  . abnormal labs    Brief Narrative:  Colton Bush is a 65 y.o. male with Past medical history of past medical history of HTN, HLD, recurrent pancreatic cancer, recent empyema as well as hepatic abscess. Patient presents with complaints of abdominal pain nausea vomiting and abnormal lab. Patient was recently hospitalized and discharged on 5/24 to [place].  Subjective:   He remains very weak, but alert oriented x3, currently denies pain, no nausea no vomiting He was on full comfort measures, he is now not sure about his decisions, I have discussed with him again he wants to keep DNR status but want to have time to think through his decisions regarding full comfort measures He would like to talk to palliative care Dr. Georgina Quint again  He lost his IV access, but able to take oral meds  Assessment & Plan:   Principal Problem:   Bacteremia due to Klebsiella pneumoniae Active Problems:   Diabetes ([place])   Adenocarcinoma of head of pancreas ([place])   Hypokalemia   Hepatic abscess   Leukocytosis   Thrombocytopenia ([place])   Empyema ([place])   Recurrent hepatic abscess /Klebsiella bacteremia Was treated with IV antibiotics, ID initially consulted, patient transition to full comfort measures on 6/6 However he was not sure about his decision today  Recurrent pancreatic cancer Not a candidate for chemotherapy due to poor performance status Oncology input appreciated who agreed with full comfort measures Continue goals of care discussion as patient is not sure about this decision today  History SDH, seizure disorder Status post bur hole surgery on 4/12 Continue Keppra  BPH with chronic indwelling Foley catheter   Skin Assessment:  I have examined the patient's skin and I agree with the wound assessment as performed by the  wound care RN as outlined below:  Pressure Injury 11/07/20 Heel Right Deep Tissue Pressure Injury - Purple or maroon localized area of discolored intact skin or blood-filled blister due to damage of underlying soft tissue from pressure and/or shear. (Active)  11/07/20 2000  Location: Heel  Location Orientation: Right  Staging: Deep Tissue Pressure Injury - Purple or maroon localized area of discolored intact skin or blood-filled blister due to damage of underlying soft tissue from pressure and/or shear.  Wound Description (Comments):   Present on Admission:      Pressure Injury 12/15/20 Heel Right Deep Tissue Pressure Injury - Purple or maroon localized area of discolored intact skin or blood-filled blister due to damage of underlying soft tissue from pressure and/or shear. (Active)  12/15/20 1800  Location: Heel  Location Orientation: Right  Staging: Deep Tissue Pressure Injury - Purple or maroon localized area of discolored intact skin or blood-filled blister due to damage of underlying soft tissue from pressure and/or shear.  Wound Description (Comments):   Present on Admission: Yes    Unresulted Labs (From admission, onward)         None        DVT prophylaxis: SCDs Start: 12/31/20 1654   Code Status: DNR Family Communication: Patient Disposition:   Status is: Inpatient  Dispo: The patient is from: Home              Anticipated d/c is to: To be determined, need continued goals of care discussion  Anticipated d/c date is:                Consultants:   Oncology  Palliative care  ID  Procedures:   None  Antimicrobials:   Anti-infectives (From admission, onward)   Start     Dose/Rate Route Frequency Ordered Stop   01/05/21 1515  metroNIDAZOLE (FLAGYL) tablet 500 mg  Status:  Discontinued        500 mg Oral Every 8 hours 01/05/21 1422 01/05/21 1502   01/05/21 1500  cephALEXin (KEFLEX) capsule 500 mg  Status:  Discontinued        500 mg Oral Every  8 hours 01/05/21 1414 01/05/21 1502   01/01/21 1000  linezolid (ZYVOX) IVPB 600 mg  Status:  Discontinued        600 mg 300 mL/hr over 60 Minutes Intravenous Every 12 hours 01/01/21 0401 01/03/21 1124   12/31/20 2200  linezolid (ZYVOX) IVPB 600 mg  Status:  Discontinued        600 mg 300 mL/hr over 60 Minutes Intravenous Every 12 hours 12/31/20 1655 01/01/21 0350   12/31/20 2200  metroNIDAZOLE (FLAGYL) IVPB 500 mg  Status:  Discontinued        500 mg 100 mL/hr over 60 Minutes Intravenous Every 8 hours 12/31/20 1655 01/03/21 1124   12/31/20 2000  cefTRIAXone (ROCEPHIN) 2 g in sodium chloride 0.9 % 100 mL IVPB  Status:  Discontinued        2 g 200 mL/hr over 30 Minutes Intravenous Every 24 hours 12/31/20 1655 01/03/21 1124   12/31/20 1230  vancomycin (VANCOREADY) IVPB 1500 mg/300 mL        1,500 mg 150 mL/hr over 120 Minutes Intravenous  Once 12/31/20 1218 12/31/20 1709   12/31/20 1215  piperacillin-tazobactam (ZOSYN) IVPB 3.375 g        3.375 g 100 mL/hr over 30 Minutes Intravenous  Once 12/31/20 1213 12/31/20 1421   12/31/20 1215  vancomycin (VANCOCIN) IVPB 1000 mg/200 mL premix  Status:  Discontinued        1,000 mg 200 mL/hr over 60 Minutes Intravenous  Once 12/31/20 1213 12/31/20 1218          Objective: Vitals:   01/04/21 1843 01/05/21 0300 01/05/21 0500 01/05/21 1109  BP: (!) 162/95   (!) 146/81  Pulse: 92   83  Resp: 15   (!) 22  Temp: 97.9 F (36.6 C) 97.8 F (36.6 C)    TempSrc:      SpO2: 97%   95%  Weight:   77.9 kg   Height:        Intake/Output Summary (Last 24 hours) at 01/05/2021 1423 Last data filed at 01/05/2021 1230 Gross per 24 hour  Intake 660 ml  Output 4200 ml  Net -3540 ml   Filed Weights   01/03/21 0500 01/04/21 0500 01/05/21 0500  Weight: 81.4 kg 80.9 kg 77.9 kg    Examination:  General exam: aaox3, very frail, chronically ill-appearing Respiratory system: Diminished at bases, no wheezing, no rales, no rhonchi respiratory effort  normal. Cardiovascular system: S1 & S2 heard, RRR. No JVD, no murmur, No pedal edema. Gastrointestinal system: Abdomen is nondistended, soft and nontender.  Normal bowel sounds heard. Central nervous system: Alert and oriented. No focal neurological deficits. Extremities: Generalized weakness, no edema Skin: No rashes, lesions or ulcers Psychiatry: Judgement and insight appear normal. Mood & affect appropriate.     Data Reviewed: I have personally reviewed following labs and imaging studies  CBC: Recent Labs  Lab 12/31/20 1128 01/01/21 0557 01/02/21 1032 01/02/21 1839 01/03/21 0713  WBC 16.3* 24.4* 20.7* 16.1* 10.5  NEUTROABS 15.5*  --  19.3* 14.8* 9.3*  HGB 11.1* 10.4* 10.8* 10.8* 10.2*  HCT 33.6* 31.1* 33.0* 32.1* 30.3*  MCV 89.6 89.9 90.2 89.2 88.6  PLT 83* 39* 11* 9* 20*    Basic Metabolic Panel: Recent Labs  Lab 12/31/20 1128 12/31/20 1242 01/01/21 0557 01/01/21 1820 01/02/21 1032 01/03/21 0713  NA 138  --  138 135 137 136  K 2.7*  --  3.0* 3.9 3.0* 3.3*  CL 104  --  104 105 104 104  CO2 25  --  21* 22 27 25   GLUCOSE 115*  --  171* 424* 307* 348*  BUN 27*  --  30* 44* 42* 29*  CREATININE 0.55*  --  0.59* 0.60* 0.62 0.44*  CALCIUM 8.6*  --  8.3* 8.0* 8.2* 7.7*  MG  --  2.0  --   --   --  1.7    GFR: Estimated Creatinine Clearance: 95.4 mL/min (A) (by C-G formula based on SCr of 0.44 mg/dL (L)).  Liver Function Tests: Recent Labs  Lab 12/31/20 1128 01/01/21 0557 01/02/21 1032 01/03/21 0713  AST 229* 94* 43* 38  ALT 227* 168* 126* 87*  ALKPHOS 1,494* 1,209* 830* 820*  BILITOT 3.7* 3.1* 3.0* 5.0*  PROT 6.1* 5.8* 5.0* 4.8*  ALBUMIN 2.4* 2.3* 1.9* 1.9*    CBG: Recent Labs  Lab 01/02/21 0747 01/02/21 1140 01/02/21 1649 01/02/21 2117 01/03/21 0734  GLUCAP 276* 284* 188* 306* 318*     Recent Results (from the past 240 hour(s))  Gastrointestinal Panel by PCR , Stool     Status: None   Collection Time: 12/28/20  6:45 AM   Specimen: STOOL   Result Value Ref Range Status   Campylobacter species NOT DETECTED NOT DETECTED Final   Plesimonas shigelloides NOT DETECTED NOT DETECTED Final   Salmonella species NOT DETECTED NOT DETECTED Final   Yersinia enterocolitica NOT DETECTED NOT DETECTED Final   Vibrio species NOT DETECTED NOT DETECTED Final   Vibrio cholerae NOT DETECTED NOT DETECTED Final   Enteroaggregative E coli (EAEC) NOT DETECTED NOT DETECTED Final   Enteropathogenic E coli (EPEC) NOT DETECTED NOT DETECTED Final   Enterotoxigenic E coli (ETEC) NOT DETECTED NOT DETECTED Final   Shiga like toxin producing E coli (STEC) NOT DETECTED NOT DETECTED Final   Shigella/Enteroinvasive E coli (EIEC) NOT DETECTED NOT DETECTED Final   Cryptosporidium NOT DETECTED NOT DETECTED Final   Cyclospora cayetanensis NOT DETECTED NOT DETECTED Final   Entamoeba histolytica NOT DETECTED NOT DETECTED Final   Giardia lamblia NOT DETECTED NOT DETECTED Final   Adenovirus F40/41 NOT DETECTED NOT DETECTED Final   Astrovirus NOT DETECTED NOT DETECTED Final   Norovirus GI/GII NOT DETECTED NOT DETECTED Final   Rotavirus A NOT DETECTED NOT DETECTED Final   Sapovirus (I, II, IV, and V) NOT DETECTED NOT DETECTED Final    Comment: Performed at [facility], [place] [place], [place] [address]  C Difficile Quick Screen w PCR reflex     Status: None   Collection Time: 12/28/20  6:45 AM   Specimen: STOOL  Result Value Ref Range Status   C Diff antigen NEGATIVE NEGATIVE Final   C Diff toxin NEGATIVE NEGATIVE Final   C Diff interpretation No C. difficile detected.  Final    Comment: Performed at [facility], [place]  [address] [place], [place] [address]  Resp Panel by RT-PCR (Flu A&B, Covid) Nasopharyngeal Swab     Status: None   Collection Time: 12/28/20 11:44 AM   Specimen: Nasopharyngeal Swab; Nasopharyngeal(NP) swabs in vial transport medium  Result Value Ref Range Status   SARS Coronavirus 2 by RT PCR NEGATIVE NEGATIVE Final     Comment: (NOTE) SARS-CoV-2 target nucleic acids are NOT DETECTED.  The SARS-CoV-2 RNA is generally detectable in upper respiratory specimens during the acute phase of infection. The lowest concentration of SARS-CoV-2 viral copies this assay can detect is 138 copies/mL. A negative result does not preclude SARS-Cov-2 infection and should not be used as the sole basis for treatment or other patient management decisions. A negative result may occur with  improper specimen collection/handling, submission of specimen other than nasopharyngeal swab, presence of viral mutation(s) within the areas targeted by this assay, and inadequate number of viral copies(<138 copies/mL). A negative result must be combined with clinical observations, patient history, and epidemiological information. The expected result is Negative.  Fact Sheet for Patients:  EntrepreneurPulse.com.au  Fact Sheet for Healthcare Providers:  IncredibleEmployment.be  This test is no t yet approved or cleared by the [place] FDA and  has been authorized for detection and/or diagnosis of SARS-CoV-2 by FDA under an Emergency Use Authorization (EUA). This EUA will remain  in effect (meaning this test can be used) for the duration of the COVID-19 declaration under Section 564(b)(1) of the Act, 21 U.S.C.section 360bbb-3(b)(1), unless the authorization is terminated  or revoked sooner.       Influenza A by PCR NEGATIVE NEGATIVE Final   Influenza B by PCR NEGATIVE NEGATIVE Final    Comment: (NOTE) The Xpert Xpress SARS-CoV-2/FLU/RSV plus assay is intended as an aid in the diagnosis of influenza from Nasopharyngeal swab specimens and should not be used as a sole basis for treatment. Nasal washings and aspirates are unacceptable for Xpert Xpress SARS-CoV-2/FLU/RSV testing.  Fact Sheet for Patients: EntrepreneurPulse.com.au  Fact Sheet for Healthcare  Providers: IncredibleEmployment.be  This test is not yet approved or cleared by the [place] FDA and has been authorized for detection and/or diagnosis of SARS-CoV-2 by FDA under an Emergency Use Authorization (EUA). This EUA will remain in effect (meaning this test can be used) for the duration of the COVID-19 declaration under Section 564(b)(1) of the Act, 21 U.S.C. section 360bbb-3(b)(1), unless the authorization is terminated or revoked.  Performed at [facility], [place] [address], [place], [place] [address]   Resp Panel by RT-PCR (Flu A&B, Covid) Nasopharyngeal Swab     Status: None   Collection Time: 12/31/20 11:28 AM   Specimen: Nasopharyngeal Swab; Nasopharyngeal(NP) swabs in vial transport medium  Result Value Ref Range Status   SARS Coronavirus 2 by RT PCR NEGATIVE NEGATIVE Final    Comment: (NOTE) SARS-CoV-2 target nucleic acids are NOT DETECTED.  The SARS-CoV-2 RNA is generally detectable in upper respiratory specimens during the acute phase of infection. The lowest concentration of SARS-CoV-2 viral copies this assay can detect is 138 copies/mL. A negative result does not preclude SARS-Cov-2 infection and should not be used as the sole basis for treatment or other patient management decisions. A negative result may occur with  improper specimen collection/handling, submission of specimen other than nasopharyngeal swab, presence of viral mutation(s) within the areas targeted by this assay, and inadequate number of viral copies(<138 copies/mL). A negative result must be combined with clinical observations, patient history, and epidemiological information. The expected result  is Negative.  Fact Sheet for Patients:  EntrepreneurPulse.com.au  Fact Sheet for Healthcare Providers:  IncredibleEmployment.be  This test is no t yet approved or cleared by the [place] FDA and  has been authorized for  detection and/or diagnosis of SARS-CoV-2 by FDA under an Emergency Use Authorization (EUA). This EUA will remain  in effect (meaning this test can be used) for the duration of the COVID-19 declaration under Section 564(b)(1) of the Act, 21 U.S.C.section 360bbb-3(b)(1), unless the authorization is terminated  or revoked sooner.       Influenza A by PCR NEGATIVE NEGATIVE Final   Influenza B by PCR NEGATIVE NEGATIVE Final    Comment: (NOTE) The Xpert Xpress SARS-CoV-2/FLU/RSV plus assay is intended as an aid in the diagnosis of influenza from Nasopharyngeal swab specimens and should not be used as a sole basis for treatment. Nasal washings and aspirates are unacceptable for Xpert Xpress SARS-CoV-2/FLU/RSV testing.  Fact Sheet for Patients: EntrepreneurPulse.com.au  Fact Sheet for Healthcare Providers: IncredibleEmployment.be  This test is not yet approved or cleared by the [place] FDA and has been authorized for detection and/or diagnosis of SARS-CoV-2 by FDA under an Emergency Use Authorization (EUA). This EUA will remain in effect (meaning this test can be used) for the duration of the COVID-19 declaration under Section 564(b)(1) of the Act, 21 U.S.C. section 360bbb-3(b)(1), unless the authorization is terminated or revoked.  Performed at [facility], [place] [address], [place], [place] [address]   Blood culture (routine x 2)     Status: Abnormal   Collection Time: 12/31/20 12:55 PM   Specimen: BLOOD LEFT WRIST  Result Value Ref Range Status   Specimen Description   Final    BLOOD LEFT WRIST Performed at [facility] [address] [place], [place] [address]    Special Requests   Final    BOTTLES DRAWN AEROBIC AND ANAEROBIC Blood Culture adequate volume Performed at [facility] [address] [place], [place] [address]    Culture  Setup Time   Final    GRAM NEGATIVE  RODS IN BOTH AEROBIC AND ANAEROBIC BOTTLES CRITICAL VALUE NOTED.  VALUE IS CONSISTENT WITH PREVIOUSLY REPORTED AND CALLED VALUE.    Culture (A)  Final    KLEBSIELLA PNEUMONIAE SUSCEPTIBILITIES PERFORMED ON PREVIOUS CULTURE WITHIN THE LAST 5 DAYS. Performed at [facility], [place] [address] [place], [place] [address]    Report Status 01/03/2021 FINAL  Final  Blood culture (routine x 2)     Status: Abnormal   Collection Time: 12/31/20 12:55 PM   Specimen: BLOOD  Result Value Ref Range Status   Specimen Description   Final    BLOOD RIGHT ANTECUBITAL Performed at [facility] [address], [place], [place] [address]    Special Requests   Final    BOTTLES DRAWN AEROBIC AND ANAEROBIC Blood Culture adequate volume Performed at [facility] [address], [place], [place] [address]    Culture  Setup Time   Final    GRAM NEGATIVE RODS IN BOTH AEROBIC AND ANAEROBIC BOTTLES CRITICAL RESULT CALLED TO, READ BACK BY AND VERIFIED WITH: PHARMD M.LILLISTON AT 0300 ON 01/01/2021 BY T.SAAD. Performed at [facility], [place] [address] [place],  [address]    Culture KLEBSIELLA PNEUMONIAE (A)  Final   Report Status 01/03/2021 FINAL  Final   Organism ID, Bacteria KLEBSIELLA PNEUMONIAE  Final      Susceptibility  Klebsiella pneumoniae - MIC*    AMPICILLIN >=32 RESISTANT Resistant     CEFAZOLIN <=4 SENSITIVE Sensitive     CEFEPIME <=0.12 SENSITIVE Sensitive     CEFTAZIDIME <=1 SENSITIVE Sensitive     CEFTRIAXONE <=0.25 SENSITIVE Sensitive     CIPROFLOXACIN <=0.25 SENSITIVE Sensitive     GENTAMICIN <=1 SENSITIVE Sensitive     IMIPENEM <=0.25 SENSITIVE Sensitive     TRIMETH/SULFA <=20 SENSITIVE Sensitive     AMPICILLIN/SULBACTAM 16 INTERMEDIATE Intermediate     PIP/TAZO 16 SENSITIVE Sensitive     * KLEBSIELLA PNEUMONIAE  Blood Culture ID Panel (Reflexed)     Status: Abnormal   Collection Time: 12/31/20 12:55 PM  Result Value Ref Range  Status   Enterococcus faecalis NOT DETECTED NOT DETECTED Final   Enterococcus Faecium NOT DETECTED NOT DETECTED Final   Listeria monocytogenes NOT DETECTED NOT DETECTED Final   Staphylococcus species NOT DETECTED NOT DETECTED Final   Staphylococcus aureus (BCID) NOT DETECTED NOT DETECTED Final   Staphylococcus epidermidis NOT DETECTED NOT DETECTED Final   Staphylococcus lugdunensis NOT DETECTED NOT DETECTED Final   Streptococcus species NOT DETECTED NOT DETECTED Final   Streptococcus agalactiae NOT DETECTED NOT DETECTED Final   Streptococcus pneumoniae NOT DETECTED NOT DETECTED Final   Streptococcus pyogenes NOT DETECTED NOT DETECTED Final   A.calcoaceticus-baumannii NOT DETECTED NOT DETECTED Final   Bacteroides fragilis NOT DETECTED NOT DETECTED Final   Enterobacterales DETECTED (A) NOT DETECTED Final    Comment: Enterobacterales represent a large order of gram negative bacteria, not a single organism. CRITICAL RESULT CALLED TO, READ BACK BY AND VERIFIED WITH: PHARMD M.LILLISTON AT 0300 ON 01/01/2021 BY T.SAAD.    Enterobacter cloacae complex NOT DETECTED NOT DETECTED Final   Escherichia coli NOT DETECTED NOT DETECTED Final   Klebsiella aerogenes NOT DETECTED NOT DETECTED Final   Klebsiella oxytoca NOT DETECTED NOT DETECTED Final   Klebsiella pneumoniae DETECTED (A) NOT DETECTED Final    Comment: CRITICAL RESULT CALLED TO, READ BACK BY AND VERIFIED WITH: PHARMD M.LILLISTON AT 0300 ON 01/01/2021 BY T.SAAD.    Proteus species NOT DETECTED NOT DETECTED Final   Salmonella species NOT DETECTED NOT DETECTED Final   Serratia marcescens NOT DETECTED NOT DETECTED Final   Haemophilus influenzae NOT DETECTED NOT DETECTED Final   Neisseria meningitidis NOT DETECTED NOT DETECTED Final   Pseudomonas aeruginosa NOT DETECTED NOT DETECTED Final   Stenotrophomonas maltophilia NOT DETECTED NOT DETECTED Final   Candida albicans NOT DETECTED NOT DETECTED Final   Candida auris NOT DETECTED NOT DETECTED  Final   Candida glabrata NOT DETECTED NOT DETECTED Final   Candida krusei NOT DETECTED NOT DETECTED Final   Candida parapsilosis NOT DETECTED NOT DETECTED Final   Candida tropicalis NOT DETECTED NOT DETECTED Final   Cryptococcus neoformans/gattii NOT DETECTED NOT DETECTED Final   CTX-M ESBL NOT DETECTED NOT DETECTED Final   Carbapenem resistance IMP NOT DETECTED NOT DETECTED Final   Carbapenem resistance KPC NOT DETECTED NOT DETECTED Final   Carbapenem resistance NDM NOT DETECTED NOT DETECTED Final   Carbapenem resist OXA 48 LIKE NOT DETECTED NOT DETECTED Final   Carbapenem resistance VIM NOT DETECTED NOT DETECTED Final    Comment: Performed at [facility], [address], [place], [place] [address]         Radiology Studies: No results found.      Scheduled Meds: . cephALEXin  500 mg Oral Q8H  . levETIRAcetam  1,000 mg Oral BID  . metroNIDAZOLE  500 mg  Oral Q8H   Continuous Infusions:   LOS: 5 days   Time spent: 31mins, case discussed with palliative care Greater than 50% of this time was spent in counseling, explanation of diagnosis, planning of further management, and coordination of care.   Voice Recognition Viviann Spare dictation system was used to create this note, attempts have been made to correct errors. Please contact the author with questions and/or clarifications.   Florencia Reasons, MD PhD FACP [facility]  Available via Epic secure chat 7am-7pm for nonurgent issues Please page for urgent issues To page the attending provider between 7A-7P or the covering provider during after hours 7P-7A, please log into the web site [email or web address] and access using universal [place] password for that web site. If you do not have the password, please call the hospital operator.    01/05/2021, 2:23 PM

## 2021-01-05 NOTE — Progress Notes (Signed)
VAS/ IV team consulted for new PIV access for IVPB Keppra, but this patient is comfort care according to patient's nurse. Talked to Dr. Rowe Pavy regarding change PO Keppra from IV. Dr. Rowe Pavy didn't want to make decision this and recommended to talk to Dr. Erlinda Hong. Paging Dr. Erlinda Hong regarding this matter and Dr. Erlinda Hong changed to po Keppra. HS [employer]

## 2021-01-05 NOTE — Progress Notes (Signed)
Daily Progress Note   Patient Name: Colton Bush       Date: 01/05/2021 DOB: 07-02-1956  Age: 65 y.o. MRN#: [id number] Attending Physician: Florencia Reasons, MD Primary Care Physician: [name], MD Admit Date: 12/31/2020   Reason for Consultation/Follow-up: Establishing goals of care  Subjective: Chart reviewed, patient seen and examined.  He is again drowsy today, soft spoken and appears fatigued.  He states he had eggs, bacon, and grits for breakfast, but not clear how much he ate.  He states that he is here in the hospital because he has pancreatic cancer that came back.  He states that he lives at home with his wife, he has a son.  He states half of him feels like "giving up" and half doesn't. We discussed current clinical situation including possibility of liver lesions being cancerous spread (metastasis) with or without abscesses. We discuss that again he has some health problems that do not appear to be 'fixable" at this point, given his decline. We reviewed previous plan of care being to continue efforts at being as comfortable as possible going forward. He states he is agreeable if there's not much left for cure to focus on comfort care. He gives permission to discuss with his wife.  Call placed to the patient's wife. She states she's doing as well as she can to deal with everything. Offered support to her in any way we can. Gave a clinical update including information discussed with the patient above. She states that if oncology feels there's no further curative treatment options she agrees to focus on EOL care/comfort care to provide as much quality of life as possible (focus on comfort, happiness, symptom relief, and dignity). She asks if he can be allowed to eat what he would like.  We  discuss disposition options, including home hospice, residential hospice, and hospital death/comfort care in place. She doesn't think she could care for him at home, and I agree this would be tough. She prefers him to receive comfort care in the hospital rather than have to endure a transfer somewhere else. We agree this is an appropriate option.   Length of Stay: 5  Current Medications: Scheduled Meds:  . levETIRAcetam  1,000 mg Oral BID    Continuous Infusions:   PRN Meds: glycopyrrolate **OR** glycopyrrolate **OR** glycopyrrolate,  HYDROmorphone (DILAUDID) injection, LORazepam **OR** LORazepam **OR** LORazepam, magic mouthwash, morphine CONCENTRATE **OR** morphine CONCENTRATE, ondansetron **OR** ondansetron (ZOFRAN) IV  Physical Exam         General: Sleepy but arousable, in no acute distress.  Confused. Seems fatigued, soft spoken. HEENT: No bruits, no goiter, no JVD Heart: Regular rate and rhythm. No murmur appreciated. Lungs: Decreased air movement, clear Abdomen: Soft, nontender, nondistended, positive bowel sounds. Appears jaundiced. Ext: No significant edema Skin: Warm and dry  Vital Signs: BP 135/77 (BP Location: Left Arm)   Pulse 98   Temp 97.8 F (36.6 C) (Oral)   Resp 14   Ht 5\' 11"  (1.803 m)   Wt 77.9 kg   SpO2 97%   BMI 23.95 kg/m  SpO2: SpO2: 97 % O2 Device: O2 Device: Room Air O2 Flow Rate:    Intake/output summary:   Intake/Output Summary (Last 24 hours) at 01/05/2021 1531 Last data filed at 01/05/2021 1445 Gross per 24 hour  Intake 660 ml  Output 5050 ml  Net -4390 ml   LBM: Last BM Date: 01/03/21 Baseline Weight: Weight: 70 kg Most recent weight: Weight: 77.9 kg       Palliative Assessment/Data: 20%    Flowsheet Rows   Flowsheet Row Most Recent Value  Intake Tab   Referral Department Hospitalist  Unit at Time of Referral Med/Surg Unit  Palliative Care Primary Diagnosis Cancer  Date Notified 12/31/20  Palliative Care Type New Palliative  care  Reason for referral Clarify Goals of Care  Date of Admission 12/31/20  Date first seen by Palliative Care 01/02/21  # of days Palliative referral response time 2 Day(s)  # of days IP prior to Palliative referral 0  Clinical Assessment   Palliative Performance Scale Score 30%  Psychosocial & Spiritual Assessment   Palliative Care Outcomes   Patient/Family meeting held? Yes  Who was at the meeting? Patient and wife      Patient Active Problem List   Diagnosis Date Noted  . Bacteremia due to Klebsiella pneumoniae 01/01/2021  . Leukocytosis 01/01/2021  . Thrombocytopenia ([place]) 01/01/2021  . Empyema ([place]) 01/01/2021  . Hepatic abscess 12/31/2020  . Anemia of chronic illness 12/09/2020  . Hypokalemia 12/09/2020  . Nausea & vomiting   . Pain   . Bladder outlet obstruction 11/12/2020  . Toxic encephalopathy 11/12/2020  . Atrophic pancreas 11/12/2020  . Empyema lung ([place])   . Subdural hematoma ([place])   . Pneumonia of both lower lobes due to infectious organism 11/06/2020  . Liver abscess 11/06/2020  . Sepsis ([place]) 11/05/2020  . Uncontrolled type 2 diabetes mellitus with ketoacidosis without coma, with long-term current use of insulin ([place]) 11/05/2020  . Increased anion gap metabolic acidosis 51/88/4166  . Chronic diastolic CHF (congestive heart failure) ([place]) 11/05/2020  . Closed right hip fracture, initial encounter ([place]) 10/18/2020  . Fall from ground level 10/18/2020  . Hypoglycemia 06/19/2017  . Hypothermia 06/19/2017  . Goals of care, counseling/discussion 09/29/2016  . Port catheter in place 04/11/2016  . Hypercalcemia 03/29/2016  . Adenocarcinoma of head of pancreas ([place]) 03/17/2016  . Biliary obstruction   . Obstructive jaundice due to malignant neoplasm ([place]) 02/27/2016  . DKA (diabetic ketoacidosis) ([place]) 02/27/2016  . Mixed diabetic hyperlipidemia associated with type 2 diabetes mellitus ([place]) 05/05/2014  . Cervical spondylosis with myelopathy 08/18/2011  .  CERUMEN IMPACTION 12/14/2008  . Diabetes ([place]) 09/16/2007  . Hyperlipidemia, mixed 09/16/2007  . Essential hypertension 09/16/2007  . GERD without esophagitis 09/16/2007  .  ESOPHAGEAL STRICTURE 04/04/2007  . HIATAL HERNIA 04/04/2007    Palliative Care Assessment & Plan   Patient Profile: 65 y.o. male  with past medical history of hypertension, hyperlipidemia, recurrent pancreatic cancer, recent empyema and recurrent hepatic abscess admitted on 12/31/2020 with complaints of nausea and vomiting and found to have recurrence of his hepatic abscess as well as Klebsiella bacteremia.  Palliative consulted for goals of care.  Recommendations/Plan:  DNR/DNI  Plan for comfort moving forward.    Visitation per end of life policy.  Liberalized diet  Discontinue unnecessary orders (labs, imaging, etc)  Goals of Care and Additional Recommendations:  Limitations on Scope of Treatment: Full Comfort Care  Code Status:    Code Status Orders  (From admission, onward)         Start     Ordered   01/03/21 1126  Do not attempt resuscitation (DNR)  Continuous       Question Answer Comment  In the event of cardiac or respiratory ARREST Do not call a "code blue"   In the event of cardiac or respiratory ARREST Do not perform Intubation, CPR, defibrillation or ACLS   In the event of cardiac or respiratory ARREST Use medication by any route, position, wound care, and other measures to relive pain and suffering. May use oxygen, suction and manual treatment of airway obstruction as needed for comfort.      01/03/21 1126        Code Status History    Date Active Date Inactive Code Status Order ID Comments User Context   01/02/2021 1650 01/03/2021 1126 DNR [id number]  Micheline Rough, MD Inpatient   12/31/2020 1655 01/02/2021 1650 Full Code [id number]  Lavina Hamman, MD ED   12/15/2020 1609 12/22/2020 2006 Full Code [id number]  Norval Morton, MD ED   11/12/2020 1820 12/08/2020 1821 Full Code [id number]  Bary Leriche, PA-C Inpatient   11/05/2020 2348 11/12/2020 1756 Full Code [id number]  Vernelle Emerald, MD ED   10/18/2020 2223 10/28/2020 1955 Full Code [id number]  Doran Heater, DO ED   06/19/2017 0232 06/20/2017 2149 Full Code [id number]  Rise Patience, MD ED   09/19/2016 1425 09/25/2016 2116 Full Code [id number]  Stark Klein, MD Inpatient   02/27/2016 2042 03/02/2016 1607 Full Code [id number]  Benito Mccreedy, MD Inpatient   02/27/2016 2042 02/27/2016 2042 Full Code [id number]  Benito Mccreedy, MD Inpatient   08/18/2011 2216 08/23/2011 2339 Full Code [id number]  Theone Stanley, RN Inpatient   Advance Care Planning Activity       Prognosis:   Likely days.  He has recurrent infection with abscesses of liver.  With plan for comfort, I believe this will progress rapidly.  Discharge Planning:  Anticipated Hospital Death Per patient and wife preferences for comfort care location/no transport out.  Care plan was discussed with patient and his wife, Pamala Hurry  Thank you for allowing the Palliative Medicine Team to assist in the care of this patient.   Time In: 1:00 Time Out: 1:45 Total Time 45 Prolonged Time Billed No      Greater than 50%  of this time was spent counseling and coordinating care related to the above assessment and plan.  Loistine Chance, MD  Walden Field, NP  Please contact Palliative Medicine Team phone at [phone] for questions and concerns.

## 2021-01-05 NOTE — Progress Notes (Signed)
CH received PG from [facility] secretary on pt.'s behalf, requesting visit.  When [facility] arrived, pt. lying in bed very drowsy.  He shared that he was diagnosed w/pancreatic cancer in 1997 and that it returned this year.  "I thought I was on my deathbed, but now I want to live," he said.  Pt. shared that his son, who has been estranged for the last 34yrs, has recently returned home: "I want time to get to know him now," pt. explained.  Pt. lives w/his wife at home.  Pt. seemed very tired and shared no further needs aside from wanting someone to come talk with him.  [place] plans to follow-up tomorrow AM when PT is generally more alert (acc. to staff).  Chaplains remain available as needed via page.  Lindaann Pascal PRN Chaplain Pager: [phone]

## 2021-01-06 MED ORDER — OXYCODONE HCL 5 MG PO TABS
5.0000 mg | ORAL_TABLET | Freq: Four times a day (QID) | ORAL | Status: DC | PRN
  Administered 2021-01-06 – 2021-01-09 (×6): 5 mg via ORAL
  Filled 2021-01-06 (×6): qty 1

## 2021-01-06 NOTE — Plan of Care (Signed)

## 2021-01-06 NOTE — Progress Notes (Signed)
PROGRESS NOTE    Colton Bush  VZD:[id number] DOB: 26-Aug-1955 DOA: 12/31/2020 PCP: [name], MD    Chief Complaint  Patient presents with   abnormal labs    Brief Narrative:  Colton Bush is a 65 y.o. male with Past medical history of past medical history of HTN, HLD, recurrent pancreatic cancer, recent empyema as well as hepatic abscess. Patient presents with complaints of abdominal pain nausea vomiting and abnormal lab. Patient was recently hospitalized and discharged on 5/24 to [facility].  Subjective:   He remains very weak, but alert oriented x3, reports some leg pain,  no nausea no vomiting He is now on full comfort measures  Assessment & Plan:   Principal Problem:   Bacteremia due to Klebsiella pneumoniae Active Problems:   Diabetes ([place])   Adenocarcinoma of head of pancreas (HCC)   Hypokalemia   Hepatic abscess   Leukocytosis   Thrombocytopenia (HCC)   Empyema (HCC)   Recurrent hepatic abscess /Klebsiella bacteremia Was treated with IV antibiotics, ID initially consulted, patient transition to full comfort measures on 6/6 He was hesitating about full comfort measures on 6/8, full comfort measures continued after rediscussion  Recurrent pancreatic cancer Not a candidate for chemotherapy due to poor performance status Oncology input appreciated who agreed with full comfort measures He was hesitating about full comfort measures on 6/8, full comfort measures continued after rediscussion  History SDH, seizure disorder Status post bur hole surgery on 4/12 Continue Keppra  BPH with chronic indwelling Foley catheter   Skin Assessment:  I have examined the patient's skin and I agree with the wound assessment as performed by the wound care RN as outlined below:  Pressure Injury 11/07/20 Heel Right Deep Tissue Pressure Injury - Purple or maroon localized area of discolored intact skin or blood-filled blister due to damage of underlying soft tissue  from pressure and/or shear. (Active)  11/07/20 2000  Location: Heel  Location Orientation: Right  Staging: Deep Tissue Pressure Injury - Purple or maroon localized area of discolored intact skin or blood-filled blister due to damage of underlying soft tissue from pressure and/or shear.  Wound Description (Comments):   Present on Admission:      Pressure Injury 12/15/20 Heel Right Deep Tissue Pressure Injury - Purple or maroon localized area of discolored intact skin or blood-filled blister due to damage of underlying soft tissue from pressure and/or shear. (Active)  12/15/20 1800  Location: Heel  Location Orientation: Right  Staging: Deep Tissue Pressure Injury - Purple or maroon localized area of discolored intact skin or blood-filled blister due to damage of underlying soft tissue from pressure and/or shear.  Wound Description (Comments):   Present on Admission: Yes    Unresulted Labs (From admission, onward)    None         DVT prophylaxis:   On full comfort measures   Code Status: DNR Family Communication: Patient Disposition:   Status is: Inpatient  Dispo: The patient is from: Home              Anticipated d/c is to: To be determined, in-hospital dialysis versus residential hospice                             Consultants:  Oncology Palliative care ID  Procedures:  None  Antimicrobials:   Anti-infectives (From admission, onward)    Start     Dose/Rate Route Frequency Ordered Stop   01/05/21  1515  metroNIDAZOLE (FLAGYL) tablet 500 mg  Status:  Discontinued        500 mg Oral Every 8 hours 01/05/21 1422 01/05/21 1502   01/05/21 1500  cephALEXin (KEFLEX) capsule 500 mg  Status:  Discontinued        500 mg Oral Every 8 hours 01/05/21 1414 01/05/21 1502   01/01/21 1000  linezolid (ZYVOX) IVPB 600 mg  Status:  Discontinued        600 mg 300 mL/hr over 60 Minutes Intravenous Every 12 hours 01/01/21 0401 01/03/21 1124   12/31/20 2200  linezolid (ZYVOX) IVPB 600  mg  Status:  Discontinued        600 mg 300 mL/hr over 60 Minutes Intravenous Every 12 hours 12/31/20 1655 01/01/21 0350   12/31/20 2200  metroNIDAZOLE (FLAGYL) IVPB 500 mg  Status:  Discontinued        500 mg 100 mL/hr over 60 Minutes Intravenous Every 8 hours 12/31/20 1655 01/03/21 1124   12/31/20 2000  cefTRIAXone (ROCEPHIN) 2 g in sodium chloride 0.9 % 100 mL IVPB  Status:  Discontinued        2 g 200 mL/hr over 30 Minutes Intravenous Every 24 hours 12/31/20 1655 01/03/21 1124   12/31/20 1230  vancomycin (VANCOREADY) IVPB 1500 mg/300 mL        1,500 mg 150 mL/hr over 120 Minutes Intravenous  Once 12/31/20 1218 12/31/20 1709   12/31/20 1215  piperacillin-tazobactam (ZOSYN) IVPB 3.375 g        3.375 g 100 mL/hr over 30 Minutes Intravenous  Once 12/31/20 1213 12/31/20 1421   12/31/20 1215  vancomycin (VANCOCIN) IVPB 1000 mg/200 mL premix  Status:  Discontinued        1,000 mg 200 mL/hr over 60 Minutes Intravenous  Once 12/31/20 1213 12/31/20 1218           Objective: Vitals:   01/05/21 1109 01/05/21 1433 01/05/21 2042 01/06/21 0432  BP: (!) 146/81 135/77 (!) 157/85 139/74  Pulse: 83 98 95 98  Resp: (!) 22 14 18 18   Temp:  97.8 F (36.6 C) 98 F (36.7 C) 97.8 F (36.6 C)  TempSrc:  Oral Oral Oral  SpO2: 95% 97% 97% 97%  Weight:      Height:        Intake/Output Summary (Last 24 hours) at 01/06/2021 1117 Last data filed at 01/06/2021 5427 Gross per 24 hour  Intake 1020 ml  Output 4050 ml  Net -3030 ml   Filed Weights   01/03/21 0500 01/04/21 0500 01/05/21 0500  Weight: 81.4 kg 80.9 kg 77.9 kg    Examination:  General exam: aaox3, very frail, chronically ill-appearing Respiratory system: Diminished at bases, no wheezing, no rales, no rhonchi respiratory effort normal. Cardiovascular system: S1 & S2 heard, RRR. No JVD, no murmur, No pedal edema. Gastrointestinal system: Abdomen is nondistended, soft and nontender.  Normal bowel sounds heard. Central nervous  system: Alert and oriented. No focal neurological deficits. Extremities: Generalized weakness, no edema Skin: No rashes, lesions or ulcers Psychiatry: Judgement and insight appear normal. Mood & affect appropriate.     Data Reviewed: I have personally reviewed following labs and imaging studies  CBC: Recent Labs  Lab 12/31/20 1128 01/01/21 0557 01/02/21 1032 01/02/21 1839 01/03/21 0713  WBC 16.3* 24.4* 20.7* 16.1* 10.5  NEUTROABS 15.5*  --  19.3* 14.8* 9.3*  HGB 11.1* 10.4* 10.8* 10.8* 10.2*  HCT 33.6* 31.1* 33.0* 32.1* 30.3*  MCV 89.6 89.9 90.2 89.2 88.6  PLT 83* 39* 11* 9* 20*    Basic Metabolic Panel: Recent Labs  Lab 12/31/20 1128 12/31/20 1242 01/01/21 0557 01/01/21 1820 01/02/21 1032 01/03/21 0713  NA 138  --  138 135 137 136  K 2.7*  --  3.0* 3.9 3.0* 3.3*  CL 104  --  104 105 104 104  CO2 25  --  21* 22 27 25   GLUCOSE 115*  --  171* 424* 307* 348*  BUN 27*  --  30* 44* 42* 29*  CREATININE 0.55*  --  0.59* 0.60* 0.62 0.44*  CALCIUM 8.6*  --  8.3* 8.0* 8.2* 7.7*  MG  --  2.0  --   --   --  1.7    GFR: Estimated Creatinine Clearance: 95.4 mL/min (A) (by C-G formula based on SCr of 0.44 mg/dL (L)).  Liver Function Tests: Recent Labs  Lab 12/31/20 1128 01/01/21 0557 01/02/21 1032 01/03/21 0713  AST 229* 94* 43* 38  ALT 227* 168* 126* 87*  ALKPHOS 1,494* 1,209* 830* 820*  BILITOT 3.7* 3.1* 3.0* 5.0*  PROT 6.1* 5.8* 5.0* 4.8*  ALBUMIN 2.4* 2.3* 1.9* 1.9*    CBG: Recent Labs  Lab 01/02/21 0747 01/02/21 1140 01/02/21 1649 01/02/21 2117 01/03/21 0734  GLUCAP 276* 284* 188* 306* 318*     Recent Results (from the past 240 hour(s))  Gastrointestinal Panel by PCR , Stool     Status: None   Collection Time: 12/28/20  6:45 AM   Specimen: STOOL  Result Value Ref Range Status   Campylobacter species NOT DETECTED NOT DETECTED Final   Plesimonas shigelloides NOT DETECTED NOT DETECTED Final   Salmonella species NOT DETECTED NOT DETECTED Final    Yersinia enterocolitica NOT DETECTED NOT DETECTED Final   Vibrio species NOT DETECTED NOT DETECTED Final   Vibrio cholerae NOT DETECTED NOT DETECTED Final   Enteroaggregative E coli (EAEC) NOT DETECTED NOT DETECTED Final   Enteropathogenic E coli (EPEC) NOT DETECTED NOT DETECTED Final   Enterotoxigenic E coli (ETEC) NOT DETECTED NOT DETECTED Final   Shiga like toxin producing E coli (STEC) NOT DETECTED NOT DETECTED Final   Shigella/Enteroinvasive E coli (EIEC) NOT DETECTED NOT DETECTED Final   Cryptosporidium NOT DETECTED NOT DETECTED Final   Cyclospora cayetanensis NOT DETECTED NOT DETECTED Final   Entamoeba histolytica NOT DETECTED NOT DETECTED Final   Giardia lamblia NOT DETECTED NOT DETECTED Final   Adenovirus F40/41 NOT DETECTED NOT DETECTED Final   Astrovirus NOT DETECTED NOT DETECTED Final   Norovirus GI/GII NOT DETECTED NOT DETECTED Final   Rotavirus A NOT DETECTED NOT DETECTED Final   Sapovirus (I, II, IV, and V) NOT DETECTED NOT DETECTED Final    Comment: Performed at [facility], [address] [place], [place] [address]  C Difficile Quick Screen w PCR reflex     Status: None   Collection Time: 12/28/20  6:45 AM   Specimen: STOOL  Result Value Ref Range Status   C Diff antigen NEGATIVE NEGATIVE Final   C Diff toxin NEGATIVE NEGATIVE Final   C Diff interpretation No C. difficile detected.  Final    Comment: Performed at [facility], [place] [address], [place], [place] [address]  Resp Panel by RT-PCR (Flu A&B, Covid) Nasopharyngeal Swab     Status: None   Collection Time: 12/28/20 11:44 AM   Specimen: Nasopharyngeal Swab; Nasopharyngeal(NP) swabs in vial transport medium  Result Value Ref Range Status   SARS Coronavirus 2 by RT PCR NEGATIVE  NEGATIVE Final    Comment: (NOTE) SARS-CoV-2 target nucleic acids are NOT DETECTED.  The SARS-CoV-2 RNA is generally detectable in upper respiratory specimens during the acute phase of infection. The  lowest concentration of SARS-CoV-2 viral copies this assay can detect is 138 copies/mL. A negative result does not preclude SARS-Cov-2 infection and should not be used as the sole basis for treatment or other patient management decisions. A negative result may occur with  improper specimen collection/handling, submission of specimen other than nasopharyngeal swab, presence of viral mutation(s) within the areas targeted by this assay, and inadequate number of viral copies(<138 copies/mL). A negative result must be combined with clinical observations, patient history, and epidemiological information. The expected result is Negative.  Fact Sheet for Patients:  EntrepreneurPulse.com.au  Fact Sheet for Healthcare Providers:  IncredibleEmployment.be  This test is no t yet approved or cleared by the [place] FDA and  has been authorized for detection and/or diagnosis of SARS-CoV-2 by FDA under an Emergency Use Authorization (EUA). This EUA will remain  in effect (meaning this test can be used) for the duration of the COVID-19 declaration under Section 564(b)(1) of the Act, 21 U.S.C.section 360bbb-3(b)(1), unless the authorization is terminated  or revoked sooner.       Influenza A by PCR NEGATIVE NEGATIVE Final   Influenza B by PCR NEGATIVE NEGATIVE Final    Comment: (NOTE) The Xpert Xpress SARS-CoV-2/FLU/RSV plus assay is intended as an aid in the diagnosis of influenza from Nasopharyngeal swab specimens and should not be used as a sole basis for treatment. Nasal washings and aspirates are unacceptable for Xpert Xpress SARS-CoV-2/FLU/RSV testing.  Fact Sheet for Patients: EntrepreneurPulse.com.au  Fact Sheet for Healthcare Providers: IncredibleEmployment.be  This test is not yet approved or cleared by the [place] FDA and has been authorized for detection and/or diagnosis of SARS-CoV-2 by FDA under  an Emergency Use Authorization (EUA). This EUA will remain in effect (meaning this test can be used) for the duration of the COVID-19 declaration under Section 564(b)(1) of the Act, 21 U.S.C. section 360bbb-3(b)(1), unless the authorization is terminated or revoked.  Performed at [facility], [place] [address], [place], [place] [address]   Resp Panel by RT-PCR (Flu A&B, Covid) Nasopharyngeal Swab     Status: None   Collection Time: 12/31/20 11:28 AM   Specimen: Nasopharyngeal Swab; Nasopharyngeal(NP) swabs in vial transport medium  Result Value Ref Range Status   SARS Coronavirus 2 by RT PCR NEGATIVE NEGATIVE Final    Comment: (NOTE) SARS-CoV-2 target nucleic acids are NOT DETECTED.  The SARS-CoV-2 RNA is generally detectable in upper respiratory specimens during the acute phase of infection. The lowest concentration of SARS-CoV-2 viral copies this assay can detect is 138 copies/mL. A negative result does not preclude SARS-Cov-2 infection and should not be used as the sole basis for treatment or other patient management decisions. A negative result may occur with  improper specimen collection/handling, submission of specimen other than nasopharyngeal swab, presence of viral mutation(s) within the areas targeted by this assay, and inadequate number of viral copies(<138 copies/mL). A negative result must be combined with clinical observations, patient history, and epidemiological information. The expected result is Negative.  Fact Sheet for Patients:  EntrepreneurPulse.com.au  Fact Sheet for Healthcare Providers:  IncredibleEmployment.be  This test is no t yet approved or cleared by the [place] FDA and  has been authorized for detection and/or diagnosis of SARS-CoV-2 by FDA under an Emergency Use Authorization (EUA). This EUA  will remain  in effect (meaning this test can be used) for the duration of the COVID-19 declaration under  Section 564(b)(1) of the Act, 21 U.S.C.section 360bbb-3(b)(1), unless the authorization is terminated  or revoked sooner.       Influenza A by PCR NEGATIVE NEGATIVE Final   Influenza B by PCR NEGATIVE NEGATIVE Final    Comment: (NOTE) The Xpert Xpress SARS-CoV-2/FLU/RSV plus assay is intended as an aid in the diagnosis of influenza from Nasopharyngeal swab specimens and should not be used as a sole basis for treatment. Nasal washings and aspirates are unacceptable for Xpert Xpress SARS-CoV-2/FLU/RSV testing.  Fact Sheet for Patients: EntrepreneurPulse.com.au  Fact Sheet for Healthcare Providers: IncredibleEmployment.be  This test is not yet approved or cleared by the [place] FDA and has been authorized for detection and/or diagnosis of SARS-CoV-2 by FDA under an Emergency Use Authorization (EUA). This EUA will remain in effect (meaning this test can be used) for the duration of the COVID-19 declaration under Section 564(b)(1) of the Act, 21 U.S.C. section 360bbb-3(b)(1), unless the authorization is terminated or revoked.  Performed at [facility], [place] [address], [place], [place] [address]   Blood culture (routine x 2)     Status: Abnormal   Collection Time: 12/31/20 12:55 PM   Specimen: BLOOD LEFT WRIST  Result Value Ref Range Status   Specimen Description   Final    BLOOD LEFT WRIST Performed at [facility] [address] [place], [place] [address]    Special Requests   Final    BOTTLES DRAWN AEROBIC AND ANAEROBIC Blood Culture adequate volume Performed at [facility] [address] [place], [place] [address]    Culture  Setup Time   Final    GRAM NEGATIVE RODS IN BOTH AEROBIC AND ANAEROBIC BOTTLES CRITICAL VALUE NOTED.  VALUE IS CONSISTENT WITH PREVIOUSLY REPORTED AND CALLED VALUE.    Culture (A)  Final    KLEBSIELLA PNEUMONIAE SUSCEPTIBILITIES PERFORMED ON  PREVIOUS CULTURE WITHIN THE LAST 5 DAYS. Performed at [facility], [place] [address] [place], [place] [address]    Report Status 01/03/2021 FINAL  Final  Blood culture (routine x 2)     Status: Abnormal   Collection Time: 12/31/20 12:55 PM   Specimen: BLOOD  Result Value Ref Range Status   Specimen Description   Final    BLOOD RIGHT ANTECUBITAL Performed at [facility] [address] [place], [place] [address]    Special Requests   Final    BOTTLES DRAWN AEROBIC AND ANAEROBIC Blood Culture adequate volume Performed at [facility] [address] [place], [place] [address]    Culture  Setup Time   Final    GRAM NEGATIVE RODS IN BOTH AEROBIC AND ANAEROBIC BOTTLES CRITICAL RESULT CALLED TO, READ BACK BY AND VERIFIED WITH: PHARMD M.LILLISTON AT 0300 ON 01/01/2021 BY T.SAAD. Performed at [facility], [place] [address] [place], [place] [address]    Culture KLEBSIELLA PNEUMONIAE (A)  Final   Report Status 01/03/2021 FINAL  Final   Organism ID, Bacteria KLEBSIELLA PNEUMONIAE  Final      Susceptibility   Klebsiella pneumoniae - MIC*    AMPICILLIN >=32 RESISTANT Resistant     CEFAZOLIN <=4 SENSITIVE Sensitive     CEFEPIME <=0.12 SENSITIVE Sensitive     CEFTAZIDIME <=1 SENSITIVE Sensitive     CEFTRIAXONE <=0.25 SENSITIVE Sensitive     CIPROFLOXACIN <=0.25 SENSITIVE Sensitive  GENTAMICIN <=1 SENSITIVE Sensitive     IMIPENEM <=0.25 SENSITIVE Sensitive     TRIMETH/SULFA <=20 SENSITIVE Sensitive     AMPICILLIN/SULBACTAM 16 INTERMEDIATE Intermediate     PIP/TAZO 16 SENSITIVE Sensitive     * KLEBSIELLA PNEUMONIAE  Blood Culture ID Panel (Reflexed)     Status: Abnormal   Collection Time: 12/31/20 12:55 PM  Result Value Ref Range Status   Enterococcus faecalis NOT DETECTED NOT DETECTED Final   Enterococcus Faecium NOT DETECTED NOT DETECTED Final   Listeria monocytogenes NOT DETECTED NOT DETECTED Final   Staphylococcus species NOT  DETECTED NOT DETECTED Final   Staphylococcus aureus (BCID) NOT DETECTED NOT DETECTED Final   Staphylococcus epidermidis NOT DETECTED NOT DETECTED Final   Staphylococcus lugdunensis NOT DETECTED NOT DETECTED Final   Streptococcus species NOT DETECTED NOT DETECTED Final   Streptococcus agalactiae NOT DETECTED NOT DETECTED Final   Streptococcus pneumoniae NOT DETECTED NOT DETECTED Final   Streptococcus pyogenes NOT DETECTED NOT DETECTED Final   A.calcoaceticus-baumannii NOT DETECTED NOT DETECTED Final   Bacteroides fragilis NOT DETECTED NOT DETECTED Final   Enterobacterales DETECTED (A) NOT DETECTED Final    Comment: Enterobacterales represent a large order of gram negative bacteria, not a single organism. CRITICAL RESULT CALLED TO, READ BACK BY AND VERIFIED WITH: PHARMD M.LILLISTON AT 0300 ON 01/01/2021 BY T.SAAD.    Enterobacter cloacae complex NOT DETECTED NOT DETECTED Final   Escherichia coli NOT DETECTED NOT DETECTED Final   Klebsiella aerogenes NOT DETECTED NOT DETECTED Final   Klebsiella oxytoca NOT DETECTED NOT DETECTED Final   Klebsiella pneumoniae DETECTED (A) NOT DETECTED Final    Comment: CRITICAL RESULT CALLED TO, READ BACK BY AND VERIFIED WITH: PHARMD M.LILLISTON AT 0300 ON 01/01/2021 BY T.SAAD.    Proteus species NOT DETECTED NOT DETECTED Final   Salmonella species NOT DETECTED NOT DETECTED Final   Serratia marcescens NOT DETECTED NOT DETECTED Final   Haemophilus influenzae NOT DETECTED NOT DETECTED Final   Neisseria meningitidis NOT DETECTED NOT DETECTED Final   Pseudomonas aeruginosa NOT DETECTED NOT DETECTED Final   Stenotrophomonas maltophilia NOT DETECTED NOT DETECTED Final   Candida albicans NOT DETECTED NOT DETECTED Final   Candida auris NOT DETECTED NOT DETECTED Final   Candida glabrata NOT DETECTED NOT DETECTED Final   Candida krusei NOT DETECTED NOT DETECTED Final   Candida parapsilosis NOT DETECTED NOT DETECTED Final   Candida tropicalis NOT DETECTED NOT  DETECTED Final   Cryptococcus neoformans/gattii NOT DETECTED NOT DETECTED Final   CTX-M ESBL NOT DETECTED NOT DETECTED Final   Carbapenem resistance IMP NOT DETECTED NOT DETECTED Final   Carbapenem resistance KPC NOT DETECTED NOT DETECTED Final   Carbapenem resistance NDM NOT DETECTED NOT DETECTED Final   Carbapenem resist OXA 48 LIKE NOT DETECTED NOT DETECTED Final   Carbapenem resistance VIM NOT DETECTED NOT DETECTED Final    Comment: Performed at [facility], [address] [place],  [address]         Radiology Studies: No results found.      Scheduled Meds:  levETIRAcetam  1,000 mg Oral BID   Continuous Infusions:   LOS: 6 days   Time spent: 31mins,  Greater than 50% of this time was spent in counseling, explanation of diagnosis, planning of further management, and coordination of care.   Voice Recognition Viviann Spare dictation system was used to create this note, attempts have been made to correct errors. Please contact the author with questions and/or clarifications.   Florencia Reasons, MD  PhD FACP [facility]  Available via Epic secure chat 7am-7pm for nonurgent issues Please page for urgent issues To page the attending provider between 7A-7P or the covering provider during after hours 7P-7A, please log into the web site [email or web address] and access using universal [place] password for that web site. If you do not have the password, please call the hospital operator.    01/06/2021, 11:17 AM

## 2021-01-06 NOTE — Care Management Important Message (Signed)
Important Message  Patient Details IM Letter given to the Patient. Name: Colton Bush MRN: [id number] Date of Birth: Dec 13, 1955   Medicare Important Message Given:  Yes     Kerin Salen 01/06/2021, 9:41 AM

## 2021-01-06 NOTE — Progress Notes (Signed)
Daily Progress Note   Patient Name: Colton Bush       Date: 01/06/2021 DOB: 17-Aug-1955  Age: 65 y.o. MRN#: [id number] Attending Physician: Florencia Reasons, MD Primary Care Physician: [name], MD Admit Date: 12/31/2020   Reason for Consultation/Follow-up: Establishing goals of care  Subjective: Chart reviewed, patient seen and examined. He appears weaker, chronically ill. Resting comfortably, no distress at present, remains on comfort measures.      Length of Stay: 6  Current Medications: Scheduled Meds:   levETIRAcetam  1,000 mg Oral BID    Continuous Infusions:   PRN Meds: glycopyrrolate **OR** glycopyrrolate **OR** glycopyrrolate, HYDROmorphone (DILAUDID) injection, LORazepam **OR** LORazepam **OR** LORazepam, magic mouthwash, morphine CONCENTRATE **OR** morphine CONCENTRATE, ondansetron **OR** ondansetron (ZOFRAN) IV, oxyCODONE  Physical Exam         General: Sleepy but arousable, in no acute distress.  Confused. Appears more fatigued.  HEENT: No bruits, no goiter, no JVD Heart: Regular rate and rhythm. No murmur appreciated. Lungs: Decreased air movement, clear Abdomen: Soft, nontender, nondistended, positive bowel sounds. Appears jaundiced. Ext: No significant edema Skin: Warm and dry  Vital Signs: BP 139/74 (BP Location: Left Arm)   Pulse 98   Temp 97.8 F (36.6 C) (Oral)   Resp 18   Ht 5\' 11"  (1.803 m)   Wt 77.9 kg   SpO2 97%   BMI 23.95 kg/m  SpO2: SpO2: 97 % O2 Device: O2 Device: Room Air O2 Flow Rate:    Intake/output summary:   Intake/Output Summary (Last 24 hours) at 01/06/2021 1339 Last data filed at 01/06/2021 3383 Gross per 24 hour  Intake 720 ml  Output 4050 ml  Net -3330 ml    LBM: Last BM Date: 01/03/21 Baseline Weight: Weight: 70 kg Most  recent weight: Weight: 77.9 kg       Palliative Assessment/Data: 20%    Flowsheet Rows    Flowsheet Row Most Recent Value  Intake Tab   Referral Department Hospitalist  Unit at Time of Referral Med/Surg Unit  Palliative Care Primary Diagnosis Cancer  Date Notified 12/31/20  Palliative Care Type New Palliative care  Reason for referral Clarify Goals of Care  Date of Admission 12/31/20  Date first seen by Palliative Care 01/02/21  # of days Palliative referral response time 2 Day(s)  # of days  IP prior to Palliative referral 0  Clinical Assessment   Palliative Performance Scale Score 30%  Psychosocial & Spiritual Assessment   Palliative Care Outcomes   Patient/Family meeting held? Yes  Who was at the meeting? Patient and wife       Patient Active Problem List   Diagnosis Date Noted   Bacteremia due to Klebsiella pneumoniae 01/01/2021   Leukocytosis 01/01/2021   Thrombocytopenia ([place]) 01/01/2021   Empyema ([place]) 01/01/2021   Hepatic abscess 12/31/2020   Anemia of chronic illness 12/09/2020   Hypokalemia 12/09/2020   Nausea & vomiting    Pain    Bladder outlet obstruction 11/12/2020   Toxic encephalopathy 11/12/2020   Atrophic pancreas 11/12/2020   Empyema lung (HCC)    Subdural hematoma ([place])    Pneumonia of both lower lobes due to infectious organism 11/06/2020   Liver abscess 11/06/2020   Sepsis ([place]) 11/05/2020   Uncontrolled type 2 diabetes mellitus with ketoacidosis without coma, with long-term current use of insulin ([place]) 11/05/2020   Increased anion gap metabolic acidosis 46/56/8127   Chronic diastolic CHF (congestive heart failure) ([place]) 11/05/2020   Closed right hip fracture, initial encounter ([place]) 10/18/2020   Fall from ground level 10/18/2020   Hypoglycemia 06/19/2017   Hypothermia 06/19/2017   Goals of care, counseling/discussion 09/29/2016   Port catheter in place 04/11/2016   Hypercalcemia 03/29/2016   Adenocarcinoma of head of pancreas ([place])  03/17/2016   Biliary obstruction    Obstructive jaundice due to malignant neoplasm ([place]) 02/27/2016   DKA (diabetic ketoacidosis) ([place]) 02/27/2016   Mixed diabetic hyperlipidemia associated with type 2 diabetes mellitus ([place]) 05/05/2014   Cervical spondylosis with myelopathy 08/18/2011   CERUMEN IMPACTION 12/14/2008   Diabetes ([place]) 09/16/2007   Hyperlipidemia, mixed 09/16/2007   Essential hypertension 09/16/2007   GERD without esophagitis 09/16/2007   ESOPHAGEAL STRICTURE 04/04/2007   HIATAL HERNIA 04/04/2007    Palliative Care Assessment & Plan   Patient Profile: 65 y.o. male  with past medical history of hypertension, hyperlipidemia, recurrent pancreatic cancer, recent empyema and recurrent hepatic abscess admitted on 12/31/2020 with complaints of nausea and vomiting and found to have recurrence of his hepatic abscess as well as Klebsiella bacteremia.  Palliative consulted for goals of care.  Recommendations/Plan: DNR/DNI Plan for comfort moving forward.   Visitation per end of life policy. Liberalize diet Discontinue unnecessary orders (labs, imaging, etc)  Goals of Care and Additional Recommendations: Limitations on Scope of Treatment: Full Comfort Care  Code Status:    Code Status Orders  (From admission, onward)           Start     Ordered   01/03/21 1126  Do not attempt resuscitation (DNR)  Continuous       Question Answer Comment  In the event of cardiac or respiratory ARREST Do not call a "code blue"   In the event of cardiac or respiratory ARREST Do not perform Intubation, CPR, defibrillation or ACLS   In the event of cardiac or respiratory ARREST Use medication by any route, position, wound care, and other measures to relive pain and suffering. May use oxygen, suction and manual treatment of airway obstruction as needed for comfort.      01/03/21 1126           Code Status History     Date Active Date Inactive Code Status Order ID Comments User  Context   01/02/2021 1650 01/03/2021 1126 DNR [id number]  Micheline Rough, MD Inpatient   12/31/2020 1655 01/02/2021  [place] Full Code [id number]  Lavina Hamman, MD ED   12/15/2020 1609 12/22/2020 2006 Full Code [id number]  Norval Morton, MD ED   11/12/2020 1820 12/08/2020 1821 Full Code [id number]  Bary Leriche, PA-C Inpatient   11/05/2020 2348 11/12/2020 1756 Full Code [id number]  Vernelle Emerald, MD ED   10/18/2020 2223 10/28/2020 1955 Full Code [id number]  Doran Heater, DO ED   06/19/2017 0232 06/20/2017 2149 Full Code [id number]  Rise Patience, MD ED   09/19/2016 1425 09/25/2016 2116 Full Code [id number]  Stark Klein, MD Inpatient   02/27/2016 2042 03/02/2016 1607 Full Code [id number]  Benito Mccreedy, MD Inpatient   02/27/2016 2042 02/27/2016 2042 Full Code [id number]  Benito Mccreedy, MD Inpatient   08/18/2011 2216 08/23/2011 2339 Full Code [id number]  Theone Stanley, RN Inpatient   Advance Care Planning Activity       Prognosis:  Likely days.  He has recurrent infection with abscesses of liver.  With plan for comfort, I believe this will progress rapidly. High risk for developing sepsis and ongoing rapid decline.   Discharge Planning: Anticipated Hospital Death Per patient and wife preferences for comfort care location/no transport out.  Care plan was discussed with patient and IDT.   Thank you for allowing the Palliative Medicine Team to assist in the care of this patient.   Time In: 1300 Time Out: 1325 Total Time 25 Prolonged Time Billed No      Greater than 50%  of this time was spent counseling and coordinating care related to the above assessment and plan.  Loistine Chance, MD     Please contact Palliative Medicine Team phone at [phone] for questions and concerns.

## 2021-01-07 ENCOUNTER — Ambulatory Visit: Payer: Medicare Other

## 2021-01-07 NOTE — Progress Notes (Signed)
Daily Progress Note   Patient Name: Colton Bush       Date: 01/07/2021 DOB: [birth year]  Age: 65 y.o. MRN#: [id number] Attending Physician: Florencia Reasons, MD Primary Care Physician: [name], MD Admit Date: 12/31/2020   Reason for Consultation/Follow-up: Establishing goals of care  Subjective: When I entered the room, the patient was sitting up in bed eating orang sherbert. States "I really like Orange sherbet and if I'm dying I want to eat what I enjoy" which I agreed with him. Appears to have eaten near 100% of his meal. Makes eye contact, appears comfortable, interactive. States his pain is well controlled, medications working well. No N/V with eating. No dyspnea. No other complaints. He is asking for another cup or sherbet or chocolate ice cream.  Because of the patient's improved outlook today, I called his wife to discuss reconsidering transfer to residential hospice such as [facility]. She expressedconcern about the location of the facility and worries he won't get good care (based on past experiences with a SNF in the similar/nearby location). I attempted to reassure her and discuss benefits (comfort-specific care, home-like environment compared to hospital environment). I discussed other residential hospice facilities but she states they are too far away ([place]). She declines to transfer him at this time. She is now worried if his insurance will cover continued hospitalization and requests TOC discussion related to that.     Length of Stay: 7  Current Medications: Scheduled Meds:   levETIRAcetam  1,000 mg Oral BID    Continuous Infusions:   PRN Meds: glycopyrrolate **OR** glycopyrrolate **OR** glycopyrrolate, HYDROmorphone (DILAUDID) injection, LORazepam **OR**  LORazepam **OR** LORazepam, magic mouthwash, morphine CONCENTRATE **OR** morphine CONCENTRATE, ondansetron **OR** ondansetron (ZOFRAN) IV, oxyCODONE  Physical Exam Vitals and nursing note reviewed.  Constitutional:      General: He is not in acute distress.    Appearance: He is ill-appearing. He is not toxic-appearing.     Comments: Somewhat fatigued, some mild pallor noted.  Pulmonary:     Effort: Pulmonary effort is normal. No respiratory distress.  Abdominal:     Comments: Appears less jaundiced  Neurological:     General: No focal deficit present.     Mental Status: He is alert.  Psychiatric:        Mood and Affect:  Mood normal.        Behavior: Behavior normal.           General: Sleepy but arousable, in no acute distress.  Confused. Appears more fatigued.  HEENT: No bruits, no goiter, no JVD Heart: Regular rate and rhythm. No murmur appreciated. Lungs: Decreased air movement, clear Abdomen: Soft, nontender, nondistended, positive bowel sounds. Appears jaundiced. Ext: No significant edema Skin: Warm and dry  Vital Signs: BP (!) 147/79 (BP Location: Right Arm)   Pulse 81   Temp 98.9 F (37.2 C) (Oral)   Resp 14   Ht 5\' 11"  (1.803 m)   Wt 77.9 kg   SpO2 97%   BMI 23.95 kg/m  SpO2: SpO2: 97 % O2 Device: O2 Device: Room Air O2 Flow Rate:    Intake/output summary:   Intake/Output Summary (Last 24 hours) at 01/07/2021 1318 Last data filed at 01/07/2021 0900 Gross per 24 hour  Intake 830 ml  Output 3500 ml  Net -2670 ml    LBM: Last BM Date: 01/06/21 Baseline Weight: Weight: 70 kg Most recent weight: Weight: 77.9 kg       Palliative Assessment/Data: 30%    Flowsheet Rows    Flowsheet Row Most Recent Value  Intake Tab   Referral Department Hospitalist  Unit at Time of Referral Med/Surg Unit  Palliative Care Primary Diagnosis Cancer  Date Notified 12/31/20  Palliative Care Type New Palliative care  Reason for referral Clarify Goals of Care  Date of  Admission 12/31/20  Date first seen by Palliative Care 01/02/21  # of days Palliative referral response time 2 Day(s)  # of days IP prior to Palliative referral 0  Clinical Assessment   Palliative Performance Scale Score 30%  Psychosocial & Spiritual Assessment   Palliative Care Outcomes   Patient/Family meeting held? Yes  Who was at the meeting? Patient and wife       Patient Active Problem List   Diagnosis Date Noted   Bacteremia due to Klebsiella pneumoniae 01/01/2021   Leukocytosis 01/01/2021   Thrombocytopenia ([place]) 01/01/2021   Empyema ([place]) 01/01/2021   Hepatic abscess 12/31/2020   Anemia of chronic illness 12/09/2020   Hypokalemia 12/09/2020   Nausea & vomiting    Pain    Bladder outlet obstruction 11/12/2020   Toxic encephalopathy 11/12/2020   Atrophic pancreas 11/12/2020   Empyema lung (HCC)    Subdural hematoma ([place])    Pneumonia of both lower lobes due to infectious organism 11/06/2020   Liver abscess 11/06/2020   Sepsis ([place]) 11/05/2020   Uncontrolled type 2 diabetes mellitus with ketoacidosis without coma, with long-term current use of insulin ([place]) 11/05/2020   Increased anion gap metabolic acidosis 43/32/9518   Chronic diastolic CHF (congestive heart failure) ([place]) 11/05/2020   Closed right hip fracture, initial encounter ([place]) 10/18/2020   Fall from ground level 10/18/2020   Hypoglycemia 06/19/2017   Hypothermia 06/19/2017   Goals of care, counseling/discussion 09/29/2016   Port catheter in place 04/11/2016   Hypercalcemia 03/29/2016   Adenocarcinoma of head of pancreas ([place]) 03/17/2016   Biliary obstruction    Obstructive jaundice due to malignant neoplasm ([place]) 02/27/2016   DKA (diabetic ketoacidosis) ([place]) 02/27/2016   Mixed diabetic hyperlipidemia associated with type 2 diabetes mellitus ([place]) 05/05/2014   Cervical spondylosis with myelopathy 08/18/2011   CERUMEN IMPACTION 12/14/2008   Diabetes ([place]) 09/16/2007   Hyperlipidemia, mixed 09/16/2007    Essential hypertension 09/16/2007   GERD without esophagitis 09/16/2007   ESOPHAGEAL STRICTURE 04/04/2007  HIATAL HERNIA 04/04/2007    Palliative Care Assessment & Plan   Patient Profile: 65 y.o. male  with past medical history of hypertension, hyperlipidemia, recurrent pancreatic cancer, recent empyema and recurrent hepatic abscess admitted on 12/31/2020 with complaints of nausea and vomiting and found to have recurrence of his hepatic abscess as well as Klebsiella bacteremia.  Palliative consulted for goals of care.  Recommendations/Plan: DNR/DNI Plan for comfort moving forward.   Visitation per end of life policy. Liberalize diet  Goals of Care and Additional Recommendations: Limitations on Scope of Treatment: Full Comfort Care  Code Status:    Code Status Orders  (From admission, onward)           Start     Ordered   01/03/21 1126  Do not attempt resuscitation (DNR)  Continuous       Question Answer Comment  In the event of cardiac or respiratory ARREST Do not call a "code blue"   In the event of cardiac or respiratory ARREST Do not perform Intubation, CPR, defibrillation or ACLS   In the event of cardiac or respiratory ARREST Use medication by any route, position, wound care, and other measures to relive pain and suffering. May use oxygen, suction and manual treatment of airway obstruction as needed for comfort.      01/03/21 1126           Code Status History     Date Active Date Inactive Code Status Order ID Comments User Context   01/02/2021 1650 01/03/2021 1126 DNR [id number]  Micheline Rough, MD Inpatient   12/31/2020 1655 01/02/2021 1650 Full Code [id number]  Lavina Hamman, MD ED   12/15/2020 1609 12/22/2020 2006 Full Code [id number]  Norval Morton, MD ED   11/12/2020 1820 12/08/2020 1821 Full Code [id number]  Bary Leriche, PA-C Inpatient   11/05/2020 2348 11/12/2020 1756 Full Code [id number]  Vernelle Emerald, MD ED   10/18/2020 2223 10/28/2020 1955 Full Code  [id number]  Doran Heater, DO ED   06/19/2017 0232 06/20/2017 2149 Full Code [id number]  Rise Patience, MD ED   09/19/2016 1425 09/25/2016 2116 Full Code [id number]  Stark Klein, MD Inpatient   02/27/2016 2042 03/02/2016 1607 Full Code [id number]  Benito Mccreedy, MD Inpatient   02/27/2016 2042 02/27/2016 2042 Full Code [id number]  Benito Mccreedy, MD Inpatient   08/18/2011 2216 08/23/2011 2339 Full Code [id number]  Theone Stanley, RN Inpatient   Advance Care Planning Activity       Prognosis:  Likely days.  He has recurrent infection with abscesses of liver.  With plan for comfort, I believe this will progress rapidly. High risk for developing sepsis and ongoing rapid decline.   Discharge Planning: Anticipated Hospital Death Per patient and wife preferences for comfort care location/no transport out.  Care plan was discussed with patient, hospitalist, patient's wife, [facility] colleague.   Thank you for allowing the Palliative Medicine Team to assist in the care of this patient.   Time In: 1230 Time Out: 130 Total Time 60 min Prolonged Time Billed No      Greater than 50%  of this time was spent counseling and coordinating care related to the above assessment and plan.  Loistine Chance, MD     Please contact Palliative Medicine Team phone at [phone] for questions and concerns.

## 2021-01-07 NOTE — Progress Notes (Signed)
PROGRESS NOTE    Colton Bush  XMI:[id number] DOB: April 06, 1956 DOA: 12/31/2020 PCP: [name], MD    Chief Complaint  Patient presents with   abnormal labs    Brief Narrative:  Colton Bush is a 65 y.o. male with Past medical history of past medical history of HTN, HLD, recurrent pancreatic cancer, recent empyema as well as hepatic abscess. Patient presents with complaints of abdominal pain nausea vomiting and abnormal lab. Patient was recently hospitalized and discharged on 5/24 to [facility].  Subjective:   He is sleepy and appear weaker He is now on full comfort measures  Assessment & Plan:   Principal Problem:   Bacteremia due to Klebsiella pneumoniae Active Problems:   Diabetes ([place])   Adenocarcinoma of head of pancreas (HCC)   Hypokalemia   Hepatic abscess   Leukocytosis   Thrombocytopenia (HCC)   Empyema (HCC)   Recurrent hepatic abscess /Klebsiella bacteremia Was treated with IV antibiotics, ID initially consulted, patient transition to full comfort measures on 6/6 He was hesitating about full comfort measures on 6/8, full comfort measures continued after rediscussion  Recurrent pancreatic cancer Not a candidate for chemotherapy due to poor performance status Oncology input appreciated who agreed with full comfort measures He was hesitating about full comfort measures on 6/8, full comfort measures continued after rediscussion  History SDH, seizure disorder Status post bur hole surgery on 4/12 Continue Keppra  BPH with chronic indwelling Foley catheter   Skin Assessment:  I have examined the patient's skin and I agree with the wound assessment as performed by the wound care RN as outlined below:  Pressure Injury 11/07/20 Heel Right Deep Tissue Pressure Injury - Purple or maroon localized area of discolored intact skin or blood-filled blister due to damage of underlying soft tissue from pressure and/or shear. (Active)  11/07/20 2000   Location: Heel  Location Orientation: Right  Staging: Deep Tissue Pressure Injury - Purple or maroon localized area of discolored intact skin or blood-filled blister due to damage of underlying soft tissue from pressure and/or shear.  Wound Description (Comments):   Present on Admission:      Pressure Injury 12/15/20 Heel Right Deep Tissue Pressure Injury - Purple or maroon localized area of discolored intact skin or blood-filled blister due to damage of underlying soft tissue from pressure and/or shear. (Active)  12/15/20 1800  Location: Heel  Location Orientation: Right  Staging: Deep Tissue Pressure Injury - Purple or maroon localized area of discolored intact skin or blood-filled blister due to damage of underlying soft tissue from pressure and/or shear.  Wound Description (Comments):   Present on Admission: Yes    Unresulted Labs (From admission, onward)    None         DVT prophylaxis:   On full comfort measures   Code Status: DNR Family Communication: Patient Disposition:   Status is: Inpatient  Dispo: The patient is from: Home              Anticipated d/c is to: To be determined, in-hospital dialysis versus residential hospice                             Consultants:  Oncology Palliative care ID  Procedures:  None  Antimicrobials:   Anti-infectives (From admission, onward)    Start     Dose/Rate Route Frequency Ordered Stop   01/05/21 1515  metroNIDAZOLE (FLAGYL) tablet 500 mg  Status:  Discontinued  500 mg Oral Every 8 hours 01/05/21 1422 01/05/21 1502   01/05/21 1500  cephALEXin (KEFLEX) capsule 500 mg  Status:  Discontinued        500 mg Oral Every 8 hours 01/05/21 1414 01/05/21 1502   01/01/21 1000  linezolid (ZYVOX) IVPB 600 mg  Status:  Discontinued        600 mg 300 mL/hr over 60 Minutes Intravenous Every 12 hours 01/01/21 0401 01/03/21 1124   12/31/20 2200  linezolid (ZYVOX) IVPB 600 mg  Status:  Discontinued        600 mg 300 mL/hr  over 60 Minutes Intravenous Every 12 hours 12/31/20 1655 01/01/21 0350   12/31/20 2200  metroNIDAZOLE (FLAGYL) IVPB 500 mg  Status:  Discontinued        500 mg 100 mL/hr over 60 Minutes Intravenous Every 8 hours 12/31/20 1655 01/03/21 1124   12/31/20 2000  cefTRIAXone (ROCEPHIN) 2 g in sodium chloride 0.9 % 100 mL IVPB  Status:  Discontinued        2 g 200 mL/hr over 30 Minutes Intravenous Every 24 hours 12/31/20 1655 01/03/21 1124   12/31/20 1230  vancomycin (VANCOREADY) IVPB 1500 mg/300 mL        1,500 mg 150 mL/hr over 120 Minutes Intravenous  Once 12/31/20 1218 12/31/20 1709   12/31/20 1215  piperacillin-tazobactam (ZOSYN) IVPB 3.375 g        3.375 g 100 mL/hr over 30 Minutes Intravenous  Once 12/31/20 1213 12/31/20 1421   12/31/20 1215  vancomycin (VANCOCIN) IVPB 1000 mg/200 mL premix  Status:  Discontinued        1,000 mg 200 mL/hr over 60 Minutes Intravenous  Once 12/31/20 1213 12/31/20 1218           Objective: Vitals:   01/06/21 0432 01/06/21 1516 01/06/21 2033 01/07/21 1353  BP: 139/74 140/86 (!) 147/79 131/82  Pulse: 98 (!) 101 81 (!) 110  Resp: 18 20 14 16   Temp: 97.8 F (36.6 C) 97.8 F (36.6 C) 98.9 F (37.2 C) 98.6 F (37 C)  TempSrc: Oral  Oral Oral  SpO2: 97% 98% 97% 98%  Weight:      Height:        Intake/Output Summary (Last 24 hours) at 01/07/2021 1926 Last data filed at 01/07/2021 1900 Gross per 24 hour  Intake 600 ml  Output 4350 ml  Net -3750 ml   Filed Weights   01/03/21 0500 01/04/21 0500 01/05/21 0500  Weight: 81.4 kg 80.9 kg 77.9 kg    Examination:  General exam: sleepy, pale and appear weaker Respiratory system: respiratory effort normal. Cardiovascular system: S1 & S2 heard, RRR. No JVD, no murmur, No pedal edema. Gastrointestinal system: Abdomen is nondistended, soft and nontender.  Normal bowel sounds heard. Central nervous system: sleepy Extremities: Generalized weakness, no edema Skin: No rashes, lesions or  ulcers Psychiatry: sleepy     Data Reviewed: I have personally reviewed following labs and imaging studies  CBC: Recent Labs  Lab 01/01/21 0557 01/02/21 1032 01/02/21 1839 01/03/21 0713  WBC 24.4* 20.7* 16.1* 10.5  NEUTROABS  --  19.3* 14.8* 9.3*  HGB 10.4* 10.8* 10.8* 10.2*  HCT 31.1* 33.0* 32.1* 30.3*  MCV 89.9 90.2 89.2 88.6  PLT 39* 11* 9* 20*    Basic Metabolic Panel: Recent Labs  Lab 01/01/21 0557 01/01/21 1820 01/02/21 1032 01/03/21 0713  NA 138 135 137 136  K 3.0* 3.9 3.0* 3.3*  CL 104 105 104 104  CO2 21* 22  27 25  GLUCOSE 171* 424* 307* 348*  BUN 30* 44* 42* 29*  CREATININE 0.59* 0.60* 0.62 0.44*  CALCIUM 8.3* 8.0* 8.2* 7.7*  MG  --   --   --  1.7    GFR: Estimated Creatinine Clearance: 95.4 mL/min (A) (by C-G formula based on SCr of 0.44 mg/dL (L)).  Liver Function Tests: Recent Labs  Lab 01/01/21 0557 01/02/21 1032 01/03/21 0713  AST 94* 43* 38  ALT 168* 126* 87*  ALKPHOS 1,209* 830* 820*  BILITOT 3.1* 3.0* 5.0*  PROT 5.8* 5.0* 4.8*  ALBUMIN 2.3* 1.9* 1.9*    CBG: Recent Labs  Lab 01/02/21 0747 01/02/21 1140 01/02/21 1649 01/02/21 2117 01/03/21 0734  GLUCAP 276* 284* 188* 306* 318*     Recent Results (from the past 240 hour(s))  Resp Panel by RT-PCR (Flu A&B, Covid) Nasopharyngeal Swab     Status: None   Collection Time: 12/31/20 11:28 AM   Specimen: Nasopharyngeal Swab; Nasopharyngeal(NP) swabs in vial transport medium  Result Value Ref Range Status   SARS Coronavirus 2 by RT PCR NEGATIVE NEGATIVE Final    Comment: (NOTE) SARS-CoV-2 target nucleic acids are NOT DETECTED.  The SARS-CoV-2 RNA is generally detectable in upper respiratory specimens during the acute phase of infection. The lowest concentration of SARS-CoV-2 viral copies this assay can detect is 138 copies/mL. A negative result does not preclude SARS-Cov-2 infection and should not be used as the sole basis for treatment or other patient management decisions. A  negative result may occur with  improper specimen collection/handling, submission of specimen other than nasopharyngeal swab, presence of viral mutation(s) within the areas targeted by this assay, and inadequate number of viral copies(<138 copies/mL). A negative result must be combined with clinical observations, patient history, and epidemiological information. The expected result is Negative.  Fact Sheet for Patients:  EntrepreneurPulse.com.au  Fact Sheet for Healthcare Providers:  IncredibleEmployment.be  This test is no t yet approved or cleared by the [place] FDA and  has been authorized for detection and/or diagnosis of SARS-CoV-2 by FDA under an Emergency Use Authorization (EUA). This EUA will remain  in effect (meaning this test can be used) for the duration of the COVID-19 declaration under Section 564(b)(1) of the Act, 21 U.S.C.section 360bbb-3(b)(1), unless the authorization is terminated  or revoked sooner.       Influenza A by PCR NEGATIVE NEGATIVE Final   Influenza B by PCR NEGATIVE NEGATIVE Final    Comment: (NOTE) The Xpert Xpress SARS-CoV-2/FLU/RSV plus assay is intended as an aid in the diagnosis of influenza from Nasopharyngeal swab specimens and should not be used as a sole basis for treatment. Nasal washings and aspirates are unacceptable for Xpert Xpress SARS-CoV-2/FLU/RSV testing.  Fact Sheet for Patients: EntrepreneurPulse.com.au  Fact Sheet for Healthcare Providers: IncredibleEmployment.be  This test is not yet approved or cleared by the [place] FDA and has been authorized for detection and/or diagnosis of SARS-CoV-2 by FDA under an Emergency Use Authorization (EUA). This EUA will remain in effect (meaning this test can be used) for the duration of the COVID-19 declaration under Section 564(b)(1) of the Act, 21 U.S.C. section 360bbb-3(b)(1), unless the authorization  is terminated or revoked.  Performed at [facility], [place] [address], [place],  [address]   Blood culture (routine x 2)     Status: Abnormal   Collection Time: 12/31/20 12:55 PM   Specimen: BLOOD LEFT WRIST  Result Value Ref Range Status   Specimen Description  Final    BLOOD LEFT WRIST Performed at [facility] [address] [place], [place] [address]    Special Requests   Final    BOTTLES DRAWN AEROBIC AND ANAEROBIC Blood Culture adequate volume Performed at [facility] [address] [place], [place] [address]    Culture  Setup Time   Final    GRAM NEGATIVE RODS IN BOTH AEROBIC AND ANAEROBIC BOTTLES CRITICAL VALUE NOTED.  VALUE IS CONSISTENT WITH PREVIOUSLY REPORTED AND CALLED VALUE.    Culture (A)  Final    KLEBSIELLA PNEUMONIAE SUSCEPTIBILITIES PERFORMED ON PREVIOUS CULTURE WITHIN THE LAST 5 DAYS. Performed at [facility], [place] [address], [place], [place] [address]    Report Status 01/03/2021 FINAL  Final  Blood culture (routine x 2)     Status: Abnormal   Collection Time: 12/31/20 12:55 PM   Specimen: BLOOD  Result Value Ref Range Status   Specimen Description   Final    BLOOD RIGHT ANTECUBITAL Performed at [facility] [address] [place], [place] [address]    Special Requests   Final    BOTTLES DRAWN AEROBIC AND ANAEROBIC Blood Culture adequate volume Performed at [facility] [address] [place], [place] [address]    Culture  Setup Time   Final    GRAM NEGATIVE RODS IN BOTH AEROBIC AND ANAEROBIC BOTTLES CRITICAL RESULT CALLED TO, READ BACK BY AND VERIFIED WITH: PHARMD M.LILLISTON AT 0300 ON 01/01/2021 BY T.SAAD. Performed at [facility], [place] [address], [place], [place] [address]    Culture KLEBSIELLA PNEUMONIAE (A)  Final   Report Status 01/03/2021 FINAL  Final   Organism ID, Bacteria KLEBSIELLA PNEUMONIAE  Final       Susceptibility   Klebsiella pneumoniae - MIC*    AMPICILLIN >=32 RESISTANT Resistant     CEFAZOLIN <=4 SENSITIVE Sensitive     CEFEPIME <=0.12 SENSITIVE Sensitive     CEFTAZIDIME <=1 SENSITIVE Sensitive     CEFTRIAXONE <=0.25 SENSITIVE Sensitive     CIPROFLOXACIN <=0.25 SENSITIVE Sensitive     GENTAMICIN <=1 SENSITIVE Sensitive     IMIPENEM <=0.25 SENSITIVE Sensitive     TRIMETH/SULFA <=20 SENSITIVE Sensitive     AMPICILLIN/SULBACTAM 16 INTERMEDIATE Intermediate     PIP/TAZO 16 SENSITIVE Sensitive     * KLEBSIELLA PNEUMONIAE  Blood Culture ID Panel (Reflexed)     Status: Abnormal   Collection Time: 12/31/20 12:55 PM  Result Value Ref Range Status   Enterococcus faecalis NOT DETECTED NOT DETECTED Final   Enterococcus Faecium NOT DETECTED NOT DETECTED Final   Listeria monocytogenes NOT DETECTED NOT DETECTED Final   Staphylococcus species NOT DETECTED NOT DETECTED Final   Staphylococcus aureus (BCID) NOT DETECTED NOT DETECTED Final   Staphylococcus epidermidis NOT DETECTED NOT DETECTED Final   Staphylococcus lugdunensis NOT DETECTED NOT DETECTED Final   Streptococcus species NOT DETECTED NOT DETECTED Final   Streptococcus agalactiae NOT DETECTED NOT DETECTED Final   Streptococcus pneumoniae NOT DETECTED NOT DETECTED Final   Streptococcus pyogenes NOT DETECTED NOT DETECTED Final   A.calcoaceticus-baumannii NOT DETECTED NOT DETECTED Final   Bacteroides fragilis NOT DETECTED NOT DETECTED Final   Enterobacterales DETECTED (A) NOT DETECTED Final    Comment: Enterobacterales represent a large order of gram negative bacteria, not a single organism. CRITICAL RESULT CALLED TO, READ BACK BY AND VERIFIED WITH: PHARMD M.LILLISTON AT 0300 ON 01/01/2021 BY T.SAAD.    Enterobacter cloacae complex NOT DETECTED NOT  DETECTED Final   Escherichia coli NOT DETECTED NOT DETECTED Final   Klebsiella aerogenes NOT DETECTED NOT DETECTED Final   Klebsiella oxytoca NOT DETECTED NOT DETECTED Final    Klebsiella pneumoniae DETECTED (A) NOT DETECTED Final    Comment: CRITICAL RESULT CALLED TO, READ BACK BY AND VERIFIED WITH: PHARMD M.LILLISTON AT 0300 ON 01/01/2021 BY T.SAAD.    Proteus species NOT DETECTED NOT DETECTED Final   Salmonella species NOT DETECTED NOT DETECTED Final   Serratia marcescens NOT DETECTED NOT DETECTED Final   Haemophilus influenzae NOT DETECTED NOT DETECTED Final   Neisseria meningitidis NOT DETECTED NOT DETECTED Final   Pseudomonas aeruginosa NOT DETECTED NOT DETECTED Final   Stenotrophomonas maltophilia NOT DETECTED NOT DETECTED Final   Candida albicans NOT DETECTED NOT DETECTED Final   Candida auris NOT DETECTED NOT DETECTED Final   Candida glabrata NOT DETECTED NOT DETECTED Final   Candida krusei NOT DETECTED NOT DETECTED Final   Candida parapsilosis NOT DETECTED NOT DETECTED Final   Candida tropicalis NOT DETECTED NOT DETECTED Final   Cryptococcus neoformans/gattii NOT DETECTED NOT DETECTED Final   CTX-M ESBL NOT DETECTED NOT DETECTED Final   Carbapenem resistance IMP NOT DETECTED NOT DETECTED Final   Carbapenem resistance KPC NOT DETECTED NOT DETECTED Final   Carbapenem resistance NDM NOT DETECTED NOT DETECTED Final   Carbapenem resist OXA 48 LIKE NOT DETECTED NOT DETECTED Final   Carbapenem resistance VIM NOT DETECTED NOT DETECTED Final    Comment: Performed at [facility], [address], [place], [place] [address]         Radiology Studies: No results found.      Scheduled Meds:  levETIRAcetam  1,000 mg Oral BID   Continuous Infusions:   LOS: 7 days   Time spent: 46mins,  Greater than 50% of this time was spent in counseling, explanation of diagnosis, planning of further management, and coordination of care.   Voice Recognition Viviann Spare dictation system was used to create this note, attempts have been made to correct errors. Please contact the author with questions and/or clarifications.   Florencia Reasons, MD PhD FACP [facility]  Available via Epic secure chat 7am-7pm for nonurgent issues Please page for urgent issues To page the attending provider between 7A-7P or the covering provider during after hours 7P-7A, please log into the web site [email or web address] and access using universal [place] password for that web site. If you do not have the password, please call the hospital operator.    01/07/2021, 7:26 PM

## 2021-01-07 NOTE — Plan of Care (Signed)

## 2021-01-08 NOTE — Progress Notes (Signed)
Manufacturing engineer [facility]) Hospital Liaison note.    Received request from [facility] for family interest in [facility]. [facility] is unable to offer a room today. Hospital Liaison will follow up tomorrow or sooner if a room becomes available and eligibility is confirmed.   Please do not hesitate to call with questions.    Thank you,   Farrel Gordon, RN, [facility] Hospital Liaison   [phone]

## 2021-01-08 NOTE — Progress Notes (Signed)
PROGRESS NOTE    Colton Bush  PJK:[id number] DOB: 02-Jul-1956 DOA: 12/31/2020 PCP: [name], MD    Chief Complaint  Patient presents with   abnormal labs    Brief Narrative:  Colton Bush is a 65 y.o. male with Past medical history of past medical history of HTN, HLD, recurrent pancreatic cancer, recent empyema as well as hepatic abscess. Patient presents with complaints of abdominal pain nausea vomiting and abnormal lab. Patient was recently hospitalized and discharged on 5/24 to [place].  Subjective:   He appears weaker, currently denies pain He is now on full comfort measures, awaiting for residential hospice placement  Assessment & Plan:   Principal Problem:   Bacteremia due to Klebsiella pneumoniae Active Problems:   Diabetes ([place])   Adenocarcinoma of head of pancreas ([name])   Hypokalemia   Hepatic abscess   Leukocytosis   Thrombocytopenia ([facility])   Empyema ([facility])   Recurrent hepatic abscess /Klebsiella bacteremia Was treated with IV antibiotics, ID initially consulted, patient transition to full comfort measures on 6/6 He was hesitating about full comfort measures on 6/8, full comfort measures continued after rediscussion  Recurrent pancreatic cancer Not a candidate for chemotherapy due to poor performance status Oncology input appreciated who agreed with full comfort measures He was hesitating about full comfort measures on 6/8, full comfort measures continued after rediscussion  History SDH, seizure disorder Status post bur hole surgery on 4/12 Continue Keppra  BPH with chronic indwelling Foley catheter   Skin Assessment:  I have examined the patient's skin and I agree with the wound assessment as performed by the wound care RN as outlined below:  Pressure Injury 11/07/20 Heel Right Deep Tissue Pressure Injury - Purple or maroon localized area of discolored intact skin or blood-filled blister due to damage of underlying soft tissue from  pressure and/or shear. (Active)  11/07/20 2000  Location: Heel  Location Orientation: Right  Staging: Deep Tissue Pressure Injury - Purple or maroon localized area of discolored intact skin or blood-filled blister due to damage of underlying soft tissue from pressure and/or shear.  Wound Description (Comments):   Present on Admission:      Pressure Injury 12/15/20 Heel Right Deep Tissue Pressure Injury - Purple or maroon localized area of discolored intact skin or blood-filled blister due to damage of underlying soft tissue from pressure and/or shear. (Active)  12/15/20 1800  Location: Heel  Location Orientation: Right  Staging: Deep Tissue Pressure Injury - Purple or maroon localized area of discolored intact skin or blood-filled blister due to damage of underlying soft tissue from pressure and/or shear.  Wound Description (Comments):   Present on Admission: Yes    Unresulted Labs (From admission, onward)    None         DVT prophylaxis:   On full comfort measures   Code Status: DNR Family Communication: Patient Disposition:   Status is: Inpatient  Dispo: The patient is from: Home              Anticipated d/c is to: residential hospice, awaiting for a bed                             Consultants:  Oncology Palliative care/hospice  ID  Procedures:  None  Antimicrobials:   Anti-infectives (From admission, onward)    Start     Dose/Rate Route Frequency Ordered Stop   01/05/21 1515  metroNIDAZOLE (FLAGYL) tablet 500 mg  Status:  Discontinued        500 mg Oral Every 8 hours 01/05/21 1422 01/05/21 1502   01/05/21 1500  cephALEXin (KEFLEX) capsule 500 mg  Status:  Discontinued        500 mg Oral Every 8 hours 01/05/21 1414 01/05/21 1502   01/01/21 1000  linezolid (ZYVOX) IVPB 600 mg  Status:  Discontinued        600 mg 300 mL/hr over 60 Minutes Intravenous Every 12 hours 01/01/21 0401 01/03/21 1124   12/31/20 2200  linezolid (ZYVOX) IVPB 600 mg  Status:   Discontinued        600 mg 300 mL/hr over 60 Minutes Intravenous Every 12 hours 12/31/20 1655 01/01/21 0350   12/31/20 2200  metroNIDAZOLE (FLAGYL) IVPB 500 mg  Status:  Discontinued        500 mg 100 mL/hr over 60 Minutes Intravenous Every 8 hours 12/31/20 1655 01/03/21 1124   12/31/20 2000  cefTRIAXone (ROCEPHIN) 2 g in sodium chloride 0.9 % 100 mL IVPB  Status:  Discontinued        2 g 200 mL/hr over 30 Minutes Intravenous Every 24 hours 12/31/20 1655 01/03/21 1124   12/31/20 1230  vancomycin (VANCOREADY) IVPB 1500 mg/300 mL        1,500 mg 150 mL/hr over 120 Minutes Intravenous  Once 12/31/20 1218 12/31/20 1709   12/31/20 1215  piperacillin-tazobactam (ZOSYN) IVPB 3.375 g        3.375 g 100 mL/hr over 30 Minutes Intravenous  Once 12/31/20 1213 12/31/20 1421   12/31/20 1215  vancomycin (VANCOCIN) IVPB 1000 mg/200 mL premix  Status:  Discontinued        1,000 mg 200 mL/hr over 60 Minutes Intravenous  Once 12/31/20 1213 12/31/20 1218           Objective: Vitals:   01/06/21 2033 01/07/21 1353 01/07/21 2204 01/08/21 1450  BP: (!) 147/79 131/82 (!) 142/71 126/67  Pulse: 81 (!) 110 (!) 103 (!) 107  Resp: 14 16 16 16   Temp: 98.9 F (37.2 C) 98.6 F (37 C) (!) 97.4 F (36.3 C) 98.9 F (37.2 C)  TempSrc: Oral Oral  Oral  SpO2: 97% 98% 96% 93%  Weight:      Height:        Intake/Output Summary (Last 24 hours) at 01/08/2021 1633 Last data filed at 01/08/2021 1500 Gross per 24 hour  Intake 240 ml  Output 2050 ml  Net -1810 ml   Filed Weights   01/03/21 0500 01/04/21 0500 01/05/21 0500  Weight: 81.4 kg 80.9 kg 77.9 kg    Examination:  General exam: pale and appear weaker, awake, does not appear in distress Respiratory system: respiratory effort normal.    Data Reviewed: I have personally reviewed following labs and imaging studies  CBC: Recent Labs  Lab 01/02/21 1032 01/02/21 1839 01/03/21 0713  WBC 20.7* 16.1* 10.5  NEUTROABS 19.3* 14.8* 9.3*  HGB 10.8*  10.8* 10.2*  HCT 33.0* 32.1* 30.3*  MCV 90.2 89.2 88.6  PLT 11* 9* 20*    Basic Metabolic Panel: Recent Labs  Lab 01/01/21 1820 01/02/21 1032 01/03/21 0713  NA 135 137 136  K 3.9 3.0* 3.3*  CL 105 104 104  CO2 22 27 25   GLUCOSE 424* 307* 348*  BUN 44* 42* 29*  CREATININE 0.60* 0.62 0.44*  CALCIUM 8.0* 8.2* 7.7*  MG  --   --  1.7    GFR: Estimated Creatinine Clearance: 95.4 mL/min (A) (by C-G  formula based on SCr of 0.44 mg/dL (L)).  Liver Function Tests: Recent Labs  Lab 01/02/21 1032 01/03/21 0713  AST 43* 38  ALT 126* 87*  ALKPHOS 830* 820*  BILITOT 3.0* 5.0*  PROT 5.0* 4.8*  ALBUMIN 1.9* 1.9*    CBG: Recent Labs  Lab 01/02/21 0747 01/02/21 1140 01/02/21 1649 01/02/21 2117 01/03/21 0734  GLUCAP 276* 284* 188* 306* 318*     Recent Results (from the past 240 hour(s))  Resp Panel by RT-PCR (Flu A&B, Covid) Nasopharyngeal Swab     Status: None   Collection Time: 12/31/20 11:28 AM   Specimen: Nasopharyngeal Swab; Nasopharyngeal(NP) swabs in vial transport medium  Result Value Ref Range Status   SARS Coronavirus 2 by RT PCR NEGATIVE NEGATIVE Final    Comment: (NOTE) SARS-CoV-2 target nucleic acids are NOT DETECTED.  The SARS-CoV-2 RNA is generally detectable in upper respiratory specimens during the acute phase of infection. The lowest concentration of SARS-CoV-2 viral copies this assay can detect is 138 copies/mL. A negative result does not preclude SARS-Cov-2 infection and should not be used as the sole basis for treatment or other patient management decisions. A negative result may occur with  improper specimen collection/handling, submission of specimen other than nasopharyngeal swab, presence of viral mutation(s) within the areas targeted by this assay, and inadequate number of viral copies(<138 copies/mL). A negative result must be combined with clinical observations, patient history, and epidemiological information. The expected result is  Negative.  Fact Sheet for Patients:  EntrepreneurPulse.com.au  Fact Sheet for Healthcare Providers:  IncredibleEmployment.be  This test is no t yet approved or cleared by the [place] FDA and  has been authorized for detection and/or diagnosis of SARS-CoV-2 by FDA under an Emergency Use Authorization (EUA). This EUA will remain  in effect (meaning this test can be used) for the duration of the COVID-19 declaration under Section 564(b)(1) of the Act, 21 U.S.C.section 360bbb-3(b)(1), unless the authorization is terminated  or revoked sooner.       Influenza A by PCR NEGATIVE NEGATIVE Final   Influenza B by PCR NEGATIVE NEGATIVE Final    Comment: (NOTE) The Xpert Xpress SARS-CoV-2/FLU/RSV plus assay is intended as an aid in the diagnosis of influenza from Nasopharyngeal swab specimens and should not be used as a sole basis for treatment. Nasal washings and aspirates are unacceptable for Xpert Xpress SARS-CoV-2/FLU/RSV testing.  Fact Sheet for Patients: EntrepreneurPulse.com.au  Fact Sheet for Healthcare Providers: IncredibleEmployment.be  This test is not yet approved or cleared by the [place] FDA and has been authorized for detection and/or diagnosis of SARS-CoV-2 by FDA under an Emergency Use Authorization (EUA). This EUA will remain in effect (meaning this test can be used) for the duration of the COVID-19 declaration under Section 564(b)(1) of the Act, 21 U.S.C. section 360bbb-3(b)(1), unless the authorization is terminated or revoked.  Performed at [facility], [place] [address] [place], [place] [address]   Blood culture (routine x 2)     Status: Abnormal   Collection Time: 12/31/20 12:55 PM   Specimen: BLOOD LEFT WRIST  Result Value Ref Range Status   Specimen Description   Final    BLOOD LEFT WRIST Performed at [facility] [address],  [place], [place] [address]    Special Requests   Final    BOTTLES DRAWN AEROBIC AND ANAEROBIC Blood Culture adequate volume Performed at [facility] [address] [place], [place] [address]    Culture  Setup Time   Final    GRAM NEGATIVE RODS IN BOTH AEROBIC AND ANAEROBIC BOTTLES CRITICAL VALUE NOTED.  VALUE IS CONSISTENT WITH PREVIOUSLY REPORTED AND CALLED VALUE.    Culture (A)  Final    KLEBSIELLA PNEUMONIAE SUSCEPTIBILITIES PERFORMED ON PREVIOUS CULTURE WITHIN THE LAST 5 DAYS. Performed at [facility], [place] [address] [place], [place] [address]    Report Status 01/03/2021 FINAL  Final  Blood culture (routine x 2)     Status: Abnormal   Collection Time: 12/31/20 12:55 PM   Specimen: BLOOD  Result Value Ref Range Status   Specimen Description   Final    BLOOD RIGHT ANTECUBITAL Performed at [facility] [address], [place], [place] [address]    Special Requests   Final    BOTTLES DRAWN AEROBIC AND ANAEROBIC Blood Culture adequate volume Performed at [facility] [address], [place], [place] [address]    Culture  Setup Time   Final    GRAM NEGATIVE RODS IN BOTH AEROBIC AND ANAEROBIC BOTTLES CRITICAL RESULT CALLED TO, READ BACK BY AND VERIFIED WITH: PHARMD M.LILLISTON AT 0300 ON 01/01/2021 BY T.SAAD. Performed at [facility], [place] [address], [place], [place] [address]    Culture KLEBSIELLA PNEUMONIAE (A)  Final   Report Status 01/03/2021 FINAL  Final   Organism ID, Bacteria KLEBSIELLA PNEUMONIAE  Final      Susceptibility   Klebsiella pneumoniae - MIC*    AMPICILLIN >=32 RESISTANT Resistant     CEFAZOLIN <=4 SENSITIVE Sensitive     CEFEPIME <=0.12 SENSITIVE Sensitive     CEFTAZIDIME <=1 SENSITIVE Sensitive     CEFTRIAXONE <=0.25 SENSITIVE Sensitive     CIPROFLOXACIN <=0.25 SENSITIVE Sensitive     GENTAMICIN <=1 SENSITIVE Sensitive     IMIPENEM <=0.25 SENSITIVE Sensitive     TRIMETH/SULFA <=20  SENSITIVE Sensitive     AMPICILLIN/SULBACTAM 16 INTERMEDIATE Intermediate     PIP/TAZO 16 SENSITIVE Sensitive     * KLEBSIELLA PNEUMONIAE  Blood Culture ID Panel (Reflexed)     Status: Abnormal   Collection Time: 12/31/20 12:55 PM  Result Value Ref Range Status   Enterococcus faecalis NOT DETECTED NOT DETECTED Final   Enterococcus Faecium NOT DETECTED NOT DETECTED Final   Listeria monocytogenes NOT DETECTED NOT DETECTED Final   Staphylococcus species NOT DETECTED NOT DETECTED Final   Staphylococcus aureus (BCID) NOT DETECTED NOT DETECTED Final   Staphylococcus epidermidis NOT DETECTED NOT DETECTED Final   Staphylococcus lugdunensis NOT DETECTED NOT DETECTED Final   Streptococcus species NOT DETECTED NOT DETECTED Final   Streptococcus agalactiae NOT DETECTED NOT DETECTED Final   Streptococcus pneumoniae NOT DETECTED NOT DETECTED Final   Streptococcus pyogenes NOT DETECTED NOT DETECTED Final   A.calcoaceticus-baumannii NOT DETECTED NOT DETECTED Final   Bacteroides fragilis NOT DETECTED NOT DETECTED Final   Enterobacterales DETECTED (A) NOT DETECTED Final    Comment: Enterobacterales represent a large order of gram negative bacteria, not a single organism. CRITICAL RESULT CALLED TO, READ BACK BY AND VERIFIED WITH: PHARMD M.LILLISTON AT 0300 ON 01/01/2021 BY T.SAAD.    Enterobacter cloacae complex NOT DETECTED NOT DETECTED Final   Escherichia coli NOT DETECTED NOT DETECTED Final   Klebsiella aerogenes NOT DETECTED NOT DETECTED Final   Klebsiella oxytoca NOT DETECTED NOT DETECTED Final   Klebsiella pneumoniae DETECTED (A) NOT DETECTED Final    Comment: CRITICAL RESULT CALLED TO, READ BACK BY AND VERIFIED WITH: PHARMD M.LILLISTON AT 0300 ON 01/01/2021  BY T.SAAD.    Proteus species NOT DETECTED NOT DETECTED Final   Salmonella species NOT DETECTED NOT DETECTED Final   Serratia marcescens NOT DETECTED NOT DETECTED Final   Haemophilus influenzae NOT DETECTED NOT DETECTED Final   Neisseria  meningitidis NOT DETECTED NOT DETECTED Final   Pseudomonas aeruginosa NOT DETECTED NOT DETECTED Final   Stenotrophomonas maltophilia NOT DETECTED NOT DETECTED Final   Candida albicans NOT DETECTED NOT DETECTED Final   Candida auris NOT DETECTED NOT DETECTED Final   Candida glabrata NOT DETECTED NOT DETECTED Final   Candida krusei NOT DETECTED NOT DETECTED Final   Candida parapsilosis NOT DETECTED NOT DETECTED Final   Candida tropicalis NOT DETECTED NOT DETECTED Final   Cryptococcus neoformans/gattii NOT DETECTED NOT DETECTED Final   CTX-M ESBL NOT DETECTED NOT DETECTED Final   Carbapenem resistance IMP NOT DETECTED NOT DETECTED Final   Carbapenem resistance KPC NOT DETECTED NOT DETECTED Final   Carbapenem resistance NDM NOT DETECTED NOT DETECTED Final   Carbapenem resist OXA 48 LIKE NOT DETECTED NOT DETECTED Final   Carbapenem resistance VIM NOT DETECTED NOT DETECTED Final    Comment: Performed at [facility], [address], [place], [place] [address]         Radiology Studies: No results found.      Scheduled Meds:  levETIRAcetam  1,000 mg Oral BID   Continuous Infusions:   LOS: 8 days   Time spent: 46mins,  Greater than 50% of this time was spent in counseling, explanation of diagnosis, planning of further management, and coordination of care.   Voice Recognition [employer]ian[employer] dictation system was used to create this note, attempts have been made to correct errors. Please contact the author with questions and/or clarifications.   Florencia Reasons, MD PhD FACP [facility]  Available via Epic secure chat 7am-7pm for nonurgent issues Please page for urgent issues To page the attending provider between 7A-7P or the covering provider during after hours 7P-7A, please log into the web site [email or web address] and access using universal [employer] password for that web site. If you do not have the password, please call the hospital operator.    01/08/2021, 4:33 PM

## 2021-01-08 NOTE — Plan of Care (Signed)

## 2021-01-08 NOTE — Progress Notes (Signed)
Daily Progress Note   Patient Name: Colton Bush       Date: 01/08/2021 DOB: Jun 21, 1956  Age: 65 y.o. MRN#: [id number] Attending Physician: Florencia Reasons, MD Primary Care Physician: [name], MD Admit Date: 12/31/2020   Reason for Consultation/Follow-up: Establishing goals of care  Subjective: Patient appears frail and weak, he has had a bowel movement.  Patient has generalized weakness, ongoing deconditioning and decline, oral intake is limited to some liquids such as a container of juice this morning.  Complains of generalized pain.  Patient states that he is aware of how sick he is and that he has so much weakness that he is not even able to turn and reposition himself while being cleaned after having a bowel movement.  I discussed with him directly about his current condition, continuation of comfort measures and introduced to him about hospice philosophy of care specifically residential hospice.  Patient waves his hand and states, "I understand that I need hospice."  Call placed and discussed with wife.  She was initially distraught about yet another move.  She states that the patient has been moved around a lot in these past few weeks and was really hoping to avoid another move.  We discussed about appropriateness of a residential hospice setting at this stage to continue comfort measures, to continue aggressive symptom management at end-of-life and to provide the patient hospice level of care going forward.  In the end she is agreeable.  She asks for her to be notified in case there is a bed at [facility] and when the patient is going to be transferred.  I discussed with her about ongoing need for comfort measures and she is in agreement.  Length of Stay: 8  Current Medications: Scheduled  Meds:   levETIRAcetam  1,000 mg Oral BID    Continuous Infusions:   PRN Meds: glycopyrrolate **OR** glycopyrrolate **OR** glycopyrrolate, HYDROmorphone (DILAUDID) injection, LORazepam **OR** LORazepam **OR** LORazepam, magic mouthwash, morphine CONCENTRATE **OR** morphine CONCENTRATE, ondansetron **OR** ondansetron (ZOFRAN) IV, oxyCODONE  Physical Exam Vitals and nursing note reviewed.  Constitutional:      General: He is not in acute distress.    Appearance: He is ill-appearing. He is not toxic-appearing.     Comments: Somewhat fatigued, some mild pallor noted.  Pulmonary:     Effort:  Pulmonary effort is normal. No respiratory distress.  Abdominal:     Comments: Appears  jaundiced  Neurological:     General: No focal deficit present.     Mental Status: He is alert.  Psychiatric:        Mood and Affect: Mood normal.        Behavior: Behavior normal.           General: Sleepy but arousable, in no acute distress.  Confused. Appears more fatigued.  HEENT: No bruits, no goiter, no JVD Heart: Regular rate and rhythm. No murmur appreciated. Lungs: Decreased air movement, clear Abdomen: Soft, nontender, nondistended, positive bowel sounds. Appears jaundiced. Ext: No significant edema Skin: Warm and dry  Vital Signs: BP (!) 142/71   Pulse (!) 103   Temp (!) 97.4 F (36.3 C)   Resp 16   Ht 5\' 11"  (1.803 m)   Wt 77.9 kg   SpO2 96%   BMI 23.95 kg/m  SpO2: SpO2: 96 % O2 Device: O2 Device: Room Air O2 Flow Rate:    Intake/output summary:   Intake/Output Summary (Last 24 hours) at 01/08/2021 0915 Last data filed at 01/07/2021 1900 Gross per 24 hour  Intake 360 ml  Output 1550 ml  Net -1190 ml    LBM: Last BM Date: 01/07/21 Baseline Weight: Weight: 70 kg Most recent weight: Weight: 77.9 kg       Palliative Assessment/Data: 30%    Flowsheet Rows    Flowsheet Row Most Recent Value  Intake Tab   Referral Department Hospitalist  Unit at Time of Referral Med/Surg  Unit  Palliative Care Primary Diagnosis Cancer  Date Notified 12/31/20  Palliative Care Type New Palliative care  Reason for referral Clarify Goals of Care  Date of Admission 12/31/20  Date first seen by Palliative Care 01/02/21  # of days Palliative referral response time 2 Day(s)  # of days IP prior to Palliative referral 0  Clinical Assessment   Palliative Performance Scale Score 30%  Psychosocial & Spiritual Assessment   Palliative Care Outcomes   Patient/Family meeting held? Yes  Who was at the meeting? Patient and wife       Patient Active Problem List   Diagnosis Date Noted   Bacteremia due to Klebsiella pneumoniae 01/01/2021   Leukocytosis 01/01/2021   Thrombocytopenia ([place]) 01/01/2021   Empyema ([place]) 01/01/2021   Hepatic abscess 12/31/2020   Anemia of chronic illness 12/09/2020   Hypokalemia 12/09/2020   Nausea & vomiting    Pain    Bladder outlet obstruction 11/12/2020   Toxic encephalopathy 11/12/2020   Atrophic pancreas 11/12/2020   Empyema lung (HCC)    Subdural hematoma ([place])    Pneumonia of both lower lobes due to infectious organism 11/06/2020   Liver abscess 11/06/2020   Sepsis ([place]) 11/05/2020   Uncontrolled type 2 diabetes mellitus with ketoacidosis without coma, with long-term current use of insulin ([place]) 11/05/2020   Increased anion gap metabolic acidosis 54/62/7035   Chronic diastolic CHF (congestive heart failure) ([place]) 11/05/2020   Closed right hip fracture, initial encounter ([place]) 10/18/2020   Fall from ground level 10/18/2020   Hypoglycemia 06/19/2017   Hypothermia 06/19/2017   Goals of care, counseling/discussion 09/29/2016   Port catheter in place 04/11/2016   Hypercalcemia 03/29/2016   Adenocarcinoma of head of pancreas ([place]) 03/17/2016   Biliary obstruction    Obstructive jaundice due to malignant neoplasm ([place]) 02/27/2016   DKA (diabetic ketoacidosis) ([place]) 02/27/2016   Mixed diabetic hyperlipidemia associated with type  2 diabetes  mellitus ([place]) 05/05/2014   Cervical spondylosis with myelopathy 08/18/2011   CERUMEN IMPACTION 12/14/2008   Diabetes ([place]) 09/16/2007   Hyperlipidemia, mixed 09/16/2007   Essential hypertension 09/16/2007   GERD without esophagitis 09/16/2007   ESOPHAGEAL STRICTURE 04/04/2007   HIATAL HERNIA 04/04/2007    Palliative Care Assessment & Plan   Patient Profile: 65 y.o. male  with past medical history of hypertension, hyperlipidemia, recurrent pancreatic cancer, recent empyema and recurrent hepatic abscess admitted on 12/31/2020 with complaints of nausea and vomiting and found to have recurrence of his hepatic abscess as well as Klebsiella bacteremia.  Palliative consulted for goals of care.  Recommendations/Plan: DNR/DNI Plan for comfort moving forward.   Visitation per end of life policy. Liberalize diet Prognosis: Likely 2 weeks or less.  And has extensive cancer, extensive metastatic burden in the liver.  He has ongoing frailty decline and deconditioning.  Oral intake is limited to liquids-1 to 2 cups of juice/sherbet at a time.  Requiring oral oxycodone for pain.   Goals of Care and Additional Recommendations: Limitations on Scope of Treatment: Full Comfort Care  Code Status:    Code Status Orders  (From admission, onward)           Start     Ordered   01/03/21 1126  Do not attempt resuscitation (DNR)  Continuous       Question Answer Comment  In the event of cardiac or respiratory ARREST Do not call a "code blue"   In the event of cardiac or respiratory ARREST Do not perform Intubation, CPR, defibrillation or ACLS   In the event of cardiac or respiratory ARREST Use medication by any route, position, wound care, and other measures to relive pain and suffering. May use oxygen, suction and manual treatment of airway obstruction as needed for comfort.      01/03/21 1126           Code Status History     Date Active Date Inactive Code Status Order ID Comments User  Context   01/02/2021 1650 01/03/2021 1126 DNR [id number]  Micheline Rough, MD Inpatient   12/31/2020 1655 01/02/2021 1650 Full Code [id number]  Lavina Hamman, MD ED   12/15/2020 1609 12/22/2020 2006 Full Code [id number]  Norval Morton, MD ED   11/12/2020 1820 12/08/2020 1821 Full Code [id number]  Bary Leriche, PA-C Inpatient   11/05/2020 2348 11/12/2020 1756 Full Code [id number]  Vernelle Emerald, MD ED   10/18/2020 2223 10/28/2020 1955 Full Code [id number]  Doran Heater, DO ED   06/19/2017 0232 06/20/2017 2149 Full Code [id number]  Rise Patience, MD ED   09/19/2016 1425 09/25/2016 2116 Full Code [id number]  Stark Klein, MD Inpatient   02/27/2016 2042 03/02/2016 1607 Full Code [id number]  Benito Mccreedy, MD Inpatient   02/27/2016 2042 02/27/2016 2042 Full Code [id number]  Benito Mccreedy, MD Inpatient   08/18/2011 2216 08/23/2011 2339 Full Code [id number]  Theone Stanley, RN Inpatient   Advance Care Planning Activity       Prognosis:  Likely days to less than 2 weeks.  He has recurrent infection with abscesses of liver.  With plan for comfort, I believe this will progress rapidly. High risk for developing sepsis and ongoing rapid decline.   Discharge Planning: Residential hospice.   Care plan was discussed with patient, patient's wife, [facility] colleague.   Thank you for allowing the Palliative Medicine Team to assist in the care of this patient.   Time In:  8.30 Time Out: 9.05 Total Time 35 min Prolonged Time Billed No      Greater than 50%  of this time was spent counseling and coordinating care related to the above assessment and plan.  Loistine Chance, MD     Please contact Palliative Medicine Team phone at [phone] for questions and concerns.

## 2021-01-08 NOTE — TOC Progression Note (Signed)
Transition of Care [facility]) - Progression Note    Patient Details  Name: Colton Bush MRN: [id number] Date of Birth: [birth year]  Transition of Care [facility]) CM/SW [place], [place] Phone Number: 01/08/2021, 10:38 AM  Clinical Narrative:   Received message from MD stating patient can go to [facility] when they have an opening, wife has agreed.  I called Ms Shiel on the home phone number, she confirmed that she does agree with this plan.  Left message for Bevely Palmer with [facility] with referral for hospice bed. TOC will continue to follow during the course of hospitalization.     Expected Discharge Plan: Hospice Medical Facility Barriers to Discharge: Other (must enter comment) (hospice facility bed opening)  Expected Discharge Plan and Services Expected Discharge Plan: [place]                                               Social Determinants of Health (SDOH) Interventions    Readmission Risk Interventions No flowsheet data found.

## 2021-01-09 NOTE — TOC Progression Note (Signed)
Transition of Care [facility]) - Progression Note   Patient Details  Name: Colton Bush MRN: [id number] Date of Birth: 1956/05/19  Transition of Care [facility]) CM/SW [place], LCSW Phone Number: 01/09/2021, 3:36 PM  Clinical Narrative: Per palliative, patient's wife does not seem to be onboard with [facility] for residential hospice at this time. CSW updated Misty with [facility]. TOC to follow.  Expected Discharge Plan: Hospice Medical Facility Barriers to Discharge: Other (must enter comment) (hospice facility bed opening)  Expected Discharge Plan and Services Expected Discharge Plan: [place]  Readmission Risk Interventions No flowsheet data found.

## 2021-01-09 NOTE — Progress Notes (Signed)
PROGRESS NOTE    Colton Bush  PTW:[id number] DOB: 10-Feb-1956 DOA: 12/31/2020 PCP: [name], MD    Chief Complaint  Patient presents with   abnormal labs    Brief Narrative:  Colton Bush is a 65 y.o. male with Past medical history of past medical history of HTN, HLD, recurrent pancreatic cancer, recent empyema as well as hepatic abscess. Patient presents with complaints of abdominal pain nausea vomiting and abnormal lab. Patient was recently hospitalized and discharged on 5/24 to [facility].  Subjective:   He ate 75% of the breakfast, is weaker, currently denies pain He is now on full comfort measures, awaiting for residential hospice placement Wife at bedside   Assessment & Plan:   Principal Problem:   Bacteremia due to Klebsiella pneumoniae Active Problems:   Diabetes ([place])   Adenocarcinoma of head of pancreas (HCC)   Hypokalemia   Hepatic abscess   Leukocytosis   Thrombocytopenia (HCC)   Empyema (HCC)   Recurrent hepatic abscess /Klebsiella bacteremia Was treated with IV antibiotics, ID initially consulted, patient transition to full comfort measures on 6/6 He was hesitating about full comfort measures on 6/8, full comfort measures continued after rediscussion  Recurrent pancreatic cancer Not a candidate for chemotherapy due to poor performance status Oncology input appreciated who agreed with full comfort measures He was hesitating about full comfort measures on 6/8, full comfort measures continued after rediscussion  History SDH, seizure disorder Status post bur hole surgery on 4/12 Continue Keppra  BPH with chronic indwelling Foley catheter   Skin Assessment:  I have examined the patient's skin and I agree with the wound assessment as performed by the wound care RN as outlined below:  Pressure Injury 11/07/20 Heel Right Deep Tissue Pressure Injury - Purple or maroon localized area of discolored intact skin or blood-filled blister due to  damage of underlying soft tissue from pressure and/or shear. (Active)  11/07/20 2000  Location: Heel  Location Orientation: Right  Staging: Deep Tissue Pressure Injury - Purple or maroon localized area of discolored intact skin or blood-filled blister due to damage of underlying soft tissue from pressure and/or shear.  Wound Description (Comments):   Present on Admission:      Pressure Injury 12/15/20 Heel Right Deep Tissue Pressure Injury - Purple or maroon localized area of discolored intact skin or blood-filled blister due to damage of underlying soft tissue from pressure and/or shear. (Active)  12/15/20 1800  Location: Heel  Location Orientation: Right  Staging: Deep Tissue Pressure Injury - Purple or maroon localized area of discolored intact skin or blood-filled blister due to damage of underlying soft tissue from pressure and/or shear.  Wound Description (Comments):   Present on Admission: Yes    Unresulted Labs (From admission, onward)    None         DVT prophylaxis:   On full comfort measures   Code Status: DNR Family Communication: wife at bedside on 6/12 Disposition:   Status is: Inpatient  Dispo: The patient is from: Home              Anticipated d/c is to: residential hospice, awaiting for a bed                             Consultants:  Oncology Palliative care/hospice  ID  Procedures:  None  Antimicrobials:   Anti-infectives (From admission, onward)    Start     Dose/Rate Route  Frequency Ordered Stop   01/05/21 1515  metroNIDAZOLE (FLAGYL) tablet 500 mg  Status:  Discontinued        500 mg Oral Every 8 hours 01/05/21 1422 01/05/21 1502   01/05/21 1500  cephALEXin (KEFLEX) capsule 500 mg  Status:  Discontinued        500 mg Oral Every 8 hours 01/05/21 1414 01/05/21 1502   01/01/21 1000  linezolid (ZYVOX) IVPB 600 mg  Status:  Discontinued        600 mg 300 mL/hr over 60 Minutes Intravenous Every 12 hours 01/01/21 0401 01/03/21 1124   12/31/20  2200  linezolid (ZYVOX) IVPB 600 mg  Status:  Discontinued        600 mg 300 mL/hr over 60 Minutes Intravenous Every 12 hours 12/31/20 1655 01/01/21 0350   12/31/20 2200  metroNIDAZOLE (FLAGYL) IVPB 500 mg  Status:  Discontinued        500 mg 100 mL/hr over 60 Minutes Intravenous Every 8 hours 12/31/20 1655 01/03/21 1124   12/31/20 2000  cefTRIAXone (ROCEPHIN) 2 g in sodium chloride 0.9 % 100 mL IVPB  Status:  Discontinued        2 g 200 mL/hr over 30 Minutes Intravenous Every 24 hours 12/31/20 1655 01/03/21 1124   12/31/20 1230  vancomycin (VANCOREADY) IVPB 1500 mg/300 mL        1,500 mg 150 mL/hr over 120 Minutes Intravenous  Once 12/31/20 1218 12/31/20 1709   12/31/20 1215  piperacillin-tazobactam (ZOSYN) IVPB 3.375 g        3.375 g 100 mL/hr over 30 Minutes Intravenous  Once 12/31/20 1213 12/31/20 1421   12/31/20 1215  vancomycin (VANCOCIN) IVPB 1000 mg/200 mL premix  Status:  Discontinued        1,000 mg 200 mL/hr over 60 Minutes Intravenous  Once 12/31/20 1213 12/31/20 1218           Objective: Vitals:   01/07/21 1353 01/07/21 2204 01/08/21 1450 01/08/21 1918  BP: 131/82 (!) 142/71 126/67 (!) 141/77  Pulse: (!) 110 (!) 103 (!) 107 (!) 114  Resp: 16 16 16  (!) 23  Temp: 98.6 F (37 C) (!) 97.4 F (36.3 C) 98.9 F (37.2 C) 98.8 F (37.1 C)  TempSrc: Oral  Oral Oral  SpO2: 98% 96% 93% 91%  Weight:      Height:        Intake/Output Summary (Last 24 hours) at 01/09/2021 1548 Last data filed at 01/09/2021 0900 Gross per 24 hour  Intake 360 ml  Output 3100 ml  Net -2740 ml   Filed Weights   01/03/21 0500 01/04/21 0500 01/05/21 0500  Weight: 81.4 kg 80.9 kg 77.9 kg    Examination:  General exam: pale and appear weaker, awake, does not appear in distress Respiratory system: respiratory effort normal.    Data Reviewed: I have personally reviewed following labs and imaging studies  CBC: Recent Labs  Lab 01/02/21 1839 01/03/21 0713  WBC 16.1* 10.5   NEUTROABS 14.8* 9.3*  HGB 10.8* 10.2*  HCT 32.1* 30.3*  MCV 89.2 88.6  PLT 9* 20*    Basic Metabolic Panel: Recent Labs  Lab 01/03/21 0713  NA 136  K 3.3*  CL 104  CO2 25  GLUCOSE 348*  BUN 29*  CREATININE 0.44*  CALCIUM 7.7*  MG 1.7    GFR: Estimated Creatinine Clearance: 95.4 mL/min (A) (by C-G formula based on SCr of 0.44 mg/dL (L)).  Liver Function Tests: Recent Labs  Lab 01/03/21 [phone]  AST 38  ALT 87*  ALKPHOS 820*  BILITOT 5.0*  PROT 4.8*  ALBUMIN 1.9*    CBG: Recent Labs  Lab 01/02/21 1649 01/02/21 2117 01/03/21 0734  GLUCAP 188* 306* 318*     Recent Results (from the past 240 hour(s))  Resp Panel by RT-PCR (Flu A&B, Covid) Nasopharyngeal Swab     Status: None   Collection Time: 12/31/20 11:28 AM   Specimen: Nasopharyngeal Swab; Nasopharyngeal(NP) swabs in vial transport medium  Result Value Ref Range Status   SARS Coronavirus 2 by RT PCR NEGATIVE NEGATIVE Final    Comment: (NOTE) SARS-CoV-2 target nucleic acids are NOT DETECTED.  The SARS-CoV-2 RNA is generally detectable in upper respiratory specimens during the acute phase of infection. The lowest concentration of SARS-CoV-2 viral copies this assay can detect is 138 copies/mL. A negative result does not preclude SARS-Cov-2 infection and should not be used as the sole basis for treatment or other patient management decisions. A negative result may occur with  improper specimen collection/handling, submission of specimen other than nasopharyngeal swab, presence of viral mutation(s) within the areas targeted by this assay, and inadequate number of viral copies(<138 copies/mL). A negative result must be combined with clinical observations, patient history, and epidemiological information. The expected result is Negative.  Fact Sheet for Patients:  EntrepreneurPulse.com.au  Fact Sheet for Healthcare Providers:  IncredibleEmployment.be  This test is no  t yet approved or cleared by the [place] FDA and  has been authorized for detection and/or diagnosis of SARS-CoV-2 by FDA under an Emergency Use Authorization (EUA). This EUA will remain  in effect (meaning this test can be used) for the duration of the COVID-19 declaration under Section 564(b)(1) of the Act, 21 U.S.C.section 360bbb-3(b)(1), unless the authorization is terminated  or revoked sooner.       Influenza A by PCR NEGATIVE NEGATIVE Final   Influenza B by PCR NEGATIVE NEGATIVE Final    Comment: (NOTE) The Xpert Xpress SARS-CoV-2/FLU/RSV plus assay is intended as an aid in the diagnosis of influenza from Nasopharyngeal swab specimens and should not be used as a sole basis for treatment. Nasal washings and aspirates are unacceptable for Xpert Xpress SARS-CoV-2/FLU/RSV testing.  Fact Sheet for Patients: EntrepreneurPulse.com.au  Fact Sheet for Healthcare Providers: IncredibleEmployment.be  This test is not yet approved or cleared by the [place] FDA and has been authorized for detection and/or diagnosis of SARS-CoV-2 by FDA under an Emergency Use Authorization (EUA). This EUA will remain in effect (meaning this test can be used) for the duration of the COVID-19 declaration under Section 564(b)(1) of the Act, 21 U.S.C. section 360bbb-3(b)(1), unless the authorization is terminated or revoked.  Performed at [facility], [place] [address] [place], [place] [address]   Blood culture (routine x 2)     Status: Abnormal   Collection Time: 12/31/20 12:55 PM   Specimen: BLOOD LEFT WRIST  Result Value Ref Range Status   Specimen Description   Final    BLOOD LEFT WRIST Performed at [facility] [address] [place], [place] [address]    Special Requests   Final    BOTTLES DRAWN AEROBIC AND ANAEROBIC Blood Culture adequate volume Performed at [facility] [address] [place], [place] [address]    Culture  Setup Time   Final    GRAM NEGATIVE RODS IN BOTH AEROBIC AND ANAEROBIC BOTTLES CRITICAL VALUE NOTED.  VALUE IS CONSISTENT WITH PREVIOUSLY REPORTED AND CALLED VALUE.  Culture (A)  Final    KLEBSIELLA PNEUMONIAE SUSCEPTIBILITIES PERFORMED ON PREVIOUS CULTURE WITHIN THE LAST 5 DAYS. Performed at [facility], [place] [address] [place], [place] [address]    Report Status 01/03/2021 FINAL  Final  Blood culture (routine x 2)     Status: Abnormal   Collection Time: 12/31/20 12:55 PM   Specimen: BLOOD  Result Value Ref Range Status   Specimen Description   Final    BLOOD RIGHT ANTECUBITAL Performed at [facility] [address] [place], [place] [address]    Special Requests   Final    BOTTLES DRAWN AEROBIC AND ANAEROBIC Blood Culture adequate volume Performed at [facility] [address], [place], [place] [address]    Culture  Setup Time   Final    GRAM NEGATIVE RODS IN BOTH AEROBIC AND ANAEROBIC BOTTLES CRITICAL RESULT CALLED TO, READ BACK BY AND VERIFIED WITH: PHARMD M.LILLISTON AT 0300 ON 01/01/2021 BY T.SAAD. Performed at [facility], [place] [address] [place], [place] [address]    Culture KLEBSIELLA PNEUMONIAE (A)  Final   Report Status 01/03/2021 FINAL  Final   Organism ID, Bacteria KLEBSIELLA PNEUMONIAE  Final      Susceptibility   Klebsiella pneumoniae - MIC*    AMPICILLIN >=32 RESISTANT Resistant     CEFAZOLIN <=4 SENSITIVE Sensitive     CEFEPIME <=0.12 SENSITIVE Sensitive     CEFTAZIDIME <=1 SENSITIVE Sensitive     CEFTRIAXONE <=0.25 SENSITIVE Sensitive     CIPROFLOXACIN <=0.25 SENSITIVE Sensitive     GENTAMICIN <=1 SENSITIVE Sensitive     IMIPENEM <=0.25 SENSITIVE Sensitive     TRIMETH/SULFA <=20 SENSITIVE Sensitive     AMPICILLIN/SULBACTAM 16 INTERMEDIATE Intermediate     PIP/TAZO 16 SENSITIVE Sensitive     * KLEBSIELLA PNEUMONIAE  Blood Culture ID Panel (Reflexed)      Status: Abnormal   Collection Time: 12/31/20 12:55 PM  Result Value Ref Range Status   Enterococcus faecalis NOT DETECTED NOT DETECTED Final   Enterococcus Faecium NOT DETECTED NOT DETECTED Final   Listeria monocytogenes NOT DETECTED NOT DETECTED Final   Staphylococcus species NOT DETECTED NOT DETECTED Final   Staphylococcus aureus (BCID) NOT DETECTED NOT DETECTED Final   Staphylococcus epidermidis NOT DETECTED NOT DETECTED Final   Staphylococcus lugdunensis NOT DETECTED NOT DETECTED Final   Streptococcus species NOT DETECTED NOT DETECTED Final   Streptococcus agalactiae NOT DETECTED NOT DETECTED Final   Streptococcus pneumoniae NOT DETECTED NOT DETECTED Final   Streptococcus pyogenes NOT DETECTED NOT DETECTED Final   A.calcoaceticus-baumannii NOT DETECTED NOT DETECTED Final   Bacteroides fragilis NOT DETECTED NOT DETECTED Final   Enterobacterales DETECTED (A) NOT DETECTED Final    Comment: Enterobacterales represent a large order of gram negative bacteria, not a single organism. CRITICAL RESULT CALLED TO, READ BACK BY AND VERIFIED WITH: PHARMD M.LILLISTON AT 0300 ON 01/01/2021 BY T.SAAD.    Enterobacter cloacae complex NOT DETECTED NOT DETECTED Final   Escherichia coli NOT DETECTED NOT DETECTED Final   Klebsiella aerogenes NOT DETECTED NOT DETECTED Final   Klebsiella oxytoca NOT DETECTED NOT DETECTED Final   Klebsiella pneumoniae DETECTED (A) NOT DETECTED Final    Comment: CRITICAL RESULT CALLED TO, READ BACK BY AND VERIFIED WITH: PHARMD M.LILLISTON AT 0300 ON 01/01/2021 BY T.SAAD.    Proteus species NOT DETECTED NOT DETECTED Final   Salmonella species NOT DETECTED NOT DETECTED Final   Serratia marcescens NOT DETECTED NOT DETECTED Final   Haemophilus  influenzae NOT DETECTED NOT DETECTED Final   Neisseria meningitidis NOT DETECTED NOT DETECTED Final   Pseudomonas aeruginosa NOT DETECTED NOT DETECTED Final   Stenotrophomonas maltophilia NOT DETECTED NOT DETECTED Final   Candida  albicans NOT DETECTED NOT DETECTED Final   Candida auris NOT DETECTED NOT DETECTED Final   Candida glabrata NOT DETECTED NOT DETECTED Final   Candida krusei NOT DETECTED NOT DETECTED Final   Candida parapsilosis NOT DETECTED NOT DETECTED Final   Candida tropicalis NOT DETECTED NOT DETECTED Final   Cryptococcus neoformans/gattii NOT DETECTED NOT DETECTED Final   CTX-M ESBL NOT DETECTED NOT DETECTED Final   Carbapenem resistance IMP NOT DETECTED NOT DETECTED Final   Carbapenem resistance KPC NOT DETECTED NOT DETECTED Final   Carbapenem resistance NDM NOT DETECTED NOT DETECTED Final   Carbapenem resist OXA 48 LIKE NOT DETECTED NOT DETECTED Final   Carbapenem resistance VIM NOT DETECTED NOT DETECTED Final    Comment: Performed at [facility], [address] [place], [place] [address]         Radiology Studies: No results found.      Scheduled Meds:  levETIRAcetam  1,000 mg Oral BID   Continuous Infusions:   LOS: 9 days   Time spent: 45mins, case discussed with wife and with palliative care Dr Rowe Pavy  Greater than 50% of this time was spent in counseling, explanation of diagnosis, planning of further management, and coordination of care.   Voice Recognition Viviann Spare dictation system was used to create this note, attempts have been made to correct errors. Please contact the author with questions and/or clarifications.   Florencia Reasons, MD PhD FACP [facility]  Available via Epic secure chat 7am-7pm for nonurgent issues Please page for urgent issues To page the attending provider between 7A-7P or the covering provider during after hours 7P-7A, please log into the web site [email or web address] and access using universal [place] password for that web site. If you do not have the password, please call the hospital operator.    01/09/2021, 3:48 PM

## 2021-01-10 NOTE — TOC Progression Note (Signed)
Transition of Care [facility]) - Progression Note    Patient Details  Name: Colton Bush MRN: [id number] Date of Birth: [birth year]  Transition of Care [facility] CM/SW [place], [place] Phone Number: 01/10/2021, 10:16 AM  Clinical Narrative:   Bevely Palmer at [facility] reports wife did not show up to sign paperwork yesterday.  Spoke with Ms Owens Shark who states she had no one to go with her yesterday, her sister and she will go today around [place] alerted. TOC will continue to follow during the course of hospitalization.      Expected Discharge Plan: Hospice Medical Facility Barriers to Discharge: Other (must enter comment) (hospice facility bed opening)  Expected Discharge Plan and Services Expected Discharge Plan: [place]                                               Social Determinants of Health (SDOH) Interventions    Readmission Risk Interventions No flowsheet data found.

## 2021-01-10 NOTE — Discharge Summary (Addendum)
Physician Discharge Summary  Colton Bush DTO:[id number] DOB: 10-03-1955 DOA: 12/31/2020  PCP: [name], MD  Admit date: 12/31/2020 Discharge date:01/11/21  Admitted From: Home Disposition:  residential Hospice  Discharge Condition:Critically ill CODE STATUS:DNR   Brief/Interim Summary: Colton Bush is a 65 y.o. male with Past medical history of past medical history of HTN, HLD, recurrent pancreatic cancer, recent empyema as well as hepatic abscess.Patient presented with complaints of abdominal pain nausea vomiting and abnormal lab.  He was found to have recurrent hepatic abscess/Klebsiella bacteremia.  IV antibiotics were initially started, ID consulted.  After extensive discussion patient was transitioned to full comfort care on 6/6 and recommended residential hospice.  Patient is waiting for bed at hospice facility.  Following problems were addressed during his hospitalization:  Recurrent hepatic abscess /Klebsiella bacteremia Was treated with IV antibiotics, ID initially consulted, patient transition to full comfort measures on 6/6 Waiting for bed at residential hospice   Recurrent pancreatic cancer Not a candidate for chemotherapy due to poor performance status Oncology also recommended full comfort measures  History SDH, seizure disorder Status post bur hole surgery on 4/12 Was on Keppra   BPH  Has chronic indwelling Foley catheter   Discharge Diagnoses:  Principal Problem:   Bacteremia due to Klebsiella pneumoniae Active Problems:   Diabetes ([place])   Adenocarcinoma of head of pancreas ([place])   Hypokalemia   Hepatic abscess   Leukocytosis   Thrombocytopenia ([place])   Empyema ([name])    Discharge Instructions  Discharge Instructions     No wound care   Complete by: As directed       Allergies as of 01/10/2021   No Known Allergies      Medication List     STOP taking these medications    acetaminophen 325 MG tablet Commonly known as: TYLENOL    ascorbic acid 500 MG tablet Commonly known as: VITAMIN C   citalopram 40 MG tablet Commonly known as: CELEXA   diclofenac Sodium 1 % Gel Commonly known as: VOLTAREN   esomeprazole 20 MG capsule Commonly known as: NEXIUM   feeding supplement (GLUCERNA SHAKE) Liqd   hydrocerin Crea   HYDROcodone-acetaminophen 5-325 MG tablet Commonly known as: NORCO/VICODIN   hydrocortisone cream 1 %   insulin detemir 100 UNIT/ML FlexPen Commonly known as: LEVEMIR   levETIRAcetam 1000 MG tablet Commonly known as: KEPPRA   lidocaine 5 % Commonly known as: LIDODERM   linezolid 600 MG tablet Commonly known as: ZYVOX   loperamide 2 MG capsule Commonly known as: IMODIUM   loratadine 10 MG tablet Commonly known as: CLARITIN   midodrine 5 MG tablet Commonly known as: PROAMATINE   multivitamin with minerals Tabs tablet   NovoLOG FlexPen 100 UNIT/ML FlexPen Generic drug: insulin aspart   omeprazole 20 MG capsule Commonly known as: PRILOSEC   ondansetron 4 MG disintegrating tablet Commonly known as: ZOFRAN-ODT   Pancrelipase (Lip-Prot-Amyl) 24000-76000 units Cpep   Pen Needles 32G X 6 MM Misc   polycarbophil 625 MG tablet Commonly known as: FIBERCON   polyethylene glycol 17 g packet Commonly known as: MIRALAX / GLYCOLAX   simethicone 80 MG chewable tablet Commonly known as: MYLICON   tamsulosin 0.4 MG Caps capsule Commonly known as: FLOMAX   zinc sulfate 220 (50 Zn) MG capsule        Follow-up Information     [facility] Follow up.   Specialty: Hospice and Palliative Medicine Why: [facility] information: [place]  [id number] [phone]               No Known Allergies  Consultations: Oncology, palliative care   Procedures/Studies: CT ABDOMEN PELVIS WO CONTRAST  Result Date: 12/31/2020 CLINICAL DATA:  Abnormal labs and lethargy. EXAM: CT CHEST, ABDOMEN AND PELVIS WITHOUT CONTRAST TECHNIQUE:  Multidetector CT imaging of the chest, abdomen and pelvis was performed following the standard protocol without IV contrast. COMPARISON:  None. FINDINGS: CT CHEST FINDINGS Cardiovascular: There is mild calcification of the aortic arch without evidence of aneurysmal dilatation. Normal heart size. No pericardial effusion. Mediastinum/Nodes: No enlarged mediastinal, hilar, or axillary lymph nodes. Thyroid gland, trachea, and esophagus demonstrate no significant findings. Lungs/Pleura: Mild to moderate severity atelectasis and/or infiltrate is seen within the superior segment of the right lower lobe and posterior aspect of the right lung base. This is decreased in severity when compared to the prior study. A trace amount of right pleural effusion is noted which is also decreased in size when compared to the prior exam. No pneumothorax is identified. Musculoskeletal: A metallic density fusion plate and screws are seen along the anterior aspect of the lower cervical spine. Multilevel degenerative changes seen throughout the thoracic spine. CT ABDOMEN PELVIS FINDINGS Hepatobiliary: Multiple heterogeneous low-attenuation liver lesions are seen. The largest measures approximately 3.6 cm x 3.7 cm and is located within the posterior aspect of the right lobe of the liver. These areas are increased in size and number when compared to the prior study. Status post cholecystectomy. No biliary dilatation. Pancreas: Unremarkable. No pancreatic ductal dilatation or surrounding inflammatory changes. Spleen: Normal in size without focal abnormality. Adrenals/Urinary Tract: Adrenal glands are unremarkable. Kidneys are normal, without renal calculi, focal lesion, or hydronephrosis. A small amount of air is seen within the lumen of an otherwise normal appearing urinary bladder. Stomach/Bowel: Surgical sutures are seen within the gastric region. The appendix is not clearly identified. No evidence of bowel wall thickening, distention, or  inflammatory changes. Vascular/Lymphatic: Aortic atherosclerosis. No enlarged abdominal or pelvic lymph nodes. Reproductive: Prostate is unremarkable. Other: No abdominal wall hernia or abnormality. No abdominopelvic ascites. Musculoskeletal: A metallic density intramedullary rod and compression screw device are seen within the proximal right femur. Multilevel degenerative changes seen throughout the lumbar spine. IMPRESSION: 1. Low-attenuation liver lesions which may represent multiple hepatic abscesses. Sequelae associated with metastatic disease cannot be excluded. 2. Mild to moderate severity right lower lobe atelectasis and/or infiltrate, decreased in severity when compared to the prior study. 3. Trace right pleural effusion. 4. Evidence of prior cholecystectomy. Electronically Signed   By: Virgina Norfolk M.D.   On: 12/31/2020 15:52   DG Chest 1 View  Result Date: 12/31/2020 CLINICAL DATA:  Leukocytosis. EXAM: CHEST  1 VIEW COMPARISON:  Dec 16, 1918 22. May 18 chest radiograph. May 19 chest CT. FINDINGS: Similar versus mildly improved opacities in the right midlung, likely relating to pleural fluid/empyema and overlying atelectasis or pneumonia, better characterized on prior CT chest. No new consolidation. No visible pneumothorax on this limited AP supine radiograph. Similar elevation right hemidiaphragm. Similar cardiomediastinal silhouette. Partially imaged cervical fusion hardware. IMPRESSION: Similar versus mildly improved opacities in the right midlung, likely relating to pleural fluid/empyema and overlying atelectasis or pneumonia, better characterized on prior CT chest. A repeat CT chest with contrast could allow for more sensitive evaluation for change if clinically indicated. Electronically Signed   By: Margaretha Sheffield MD   On: 12/31/2020 12:29   DG Chest 2 View  Result Date: 12/13/2020 CLINICAL  DATA:  Weakness EXAM: CHEST - 2 VIEW COMPARISON:  October 09, 2020 FINDINGS: Right upper extremity  PICC with tip overlying the superior cavoatrial junction. The heart size and mediastinal contours are within normal limits. Increased conspicuity of the they density overlying the right midlung. Left lung is clear. The visualized skeletal structures are unremarkable. IMPRESSION: Increased conspicuity of they density overlying the right midlung, corresponding with the posterior loculated fluid collection. Electronically Signed   By: Dahlia Bailiff MD   On: 12/13/2020 21:07   CT Head Wo Contrast  Result Date: 12/31/2020 CLINICAL DATA:  Confusion.  History of pancreatic cancer EXAM: CT HEAD WITHOUT CONTRAST TECHNIQUE: Contiguous axial images were obtained from the base of the skull through the vertex without intravenous contrast. COMPARISON:  CT head 12/13/2020 FINDINGS: Brain: Mild ventricular enlargement unchanged likely due to atrophy. Mild periventricular white matter hypodensity bilaterally. Mild dural thickening or isodense fluid collection left frontal region measuring 3 mm unchanged from the prior study. No acute infarct or mass. Vascular: Negative for hyperdense vessel Skull: Left frontal burr hole.  No acute abnormality. Sinuses/Orbits: Negative Other: None IMPRESSION: 3 mm thickening of the left frontal dura versus isodense fluid collection, unchanged from the prior study. No new hemorrhage. Electronically Signed   By: Franchot Gallo M.D.   On: 12/31/2020 12:56   CT Head Wo Contrast  Result Date: 12/13/2020 CLINICAL DATA:  Head trauma fall EXAM: CT HEAD WITHOUT CONTRAST TECHNIQUE: Contiguous axial images were obtained from the base of the skull through the vertex without intravenous contrast. COMPARISON:  CT brain 12/10/2020 FINDINGS: Brain: No acute territorial infarction, mass or new hemorrhage is visualized. Left hemispheric subdural hematoma without significant change since 12/10/2020, measures 3 mm maximum. No midline shift. Mild atrophy and chronic small vessel ischemic changes of the white  matter. Vascular: No hyperdense vessels.  No unexpected calcification Skull: Left frontal burr hole.  No fracture Sinuses/Orbits: No acute finding. Other: None IMPRESSION: 1. No significant change in size or appearance of thin left convexity subdural hematoma without significant mass effect or midline shift. No acute interval change since 12/10/2020. 2. Atrophy and chronic small vessel ischemic change of the white matter Electronically Signed   By: Donavan Foil M.D.   On: 12/13/2020 22:05   CT Chest Wo Contrast  Result Date: 12/31/2020 CLINICAL DATA:  Abnormal labs and lethargy. EXAM: CT CHEST, ABDOMEN AND PELVIS WITHOUT CONTRAST TECHNIQUE: Multidetector CT imaging of the chest, abdomen and pelvis was performed following the standard protocol without IV contrast. COMPARISON:  Dec 16, 2020 FINDINGS: CT CHEST FINDINGS Cardiovascular: There is mild calcification of the aortic arch without evidence of aneurysmal dilatation. Normal heart size. No pericardial effusion. Mediastinum/Nodes: No enlarged mediastinal, hilar, or axillary lymph nodes. Thyroid gland, trachea, and esophagus demonstrate no significant findings. Lungs/Pleura: Mild to moderate severity atelectasis and/or infiltrate is seen within the superior segment of the right lower lobe and posterior aspect of the right lung base. This is decreased in severity when compared to the prior study. A trace amount of right pleural effusion is noted which is also decreased in size when compared to the prior exam. No pneumothorax is identified. Musculoskeletal: A metallic density fusion plate and screws are seen along the anterior aspect of the lower cervical spine. Multilevel degenerative changes seen throughout the thoracic spine. CT ABDOMEN PELVIS FINDINGS Hepatobiliary: Multiple heterogeneous low-attenuation liver lesions are seen. The largest measures approximately 3.6 cm x 3.7 cm and is located within the posterior aspect of the right lobe  of the liver. These  areas are increased in size and number when compared to the prior study. Status post cholecystectomy. No biliary dilatation. Pancreas: Unremarkable. No pancreatic ductal dilatation or surrounding inflammatory changes. Spleen: Normal in size without focal abnormality. Adrenals/Urinary Tract: Adrenal glands are unremarkable. Kidneys are normal, without renal calculi, focal lesion, or hydronephrosis. A small amount of air is seen within the lumen of an otherwise normal appearing urinary bladder. Stomach/Bowel: Surgical sutures are seen within the gastric region. The appendix is not clearly identified. No evidence of bowel wall thickening, distention, or inflammatory changes. Vascular/Lymphatic: Aortic atherosclerosis. No enlarged abdominal or pelvic lymph nodes. Reproductive: Prostate is unremarkable. Other: No abdominal wall hernia or abnormality. No abdominopelvic ascites. Musculoskeletal: A metallic density intramedullary rod and compression screw device are seen within the proximal right femur. Multilevel degenerative changes seen throughout the lumbar spine. IMPRESSION: 1. Low-attenuation liver lesions which may represent multiple hepatic abscesses. Sequelae associated with metastatic disease cannot be excluded. 2. Mild to moderate severity right lower lobe atelectasis and/or infiltrate, decreased in severity when compared to the prior study. 3. Trace right pleural effusion. 4. Evidence of prior cholecystectomy. Electronically Signed   By: Virgina Norfolk M.D.   On: 12/31/2020 15:52   CT CHEST WO CONTRAST  Result Date: 12/16/2020 CLINICAL DATA:  Evaluate empyema EXAM: CT CHEST WITHOUT CONTRAST TECHNIQUE: Multidetector CT imaging of the chest was performed following the standard protocol without IV contrast. COMPARISON:  CT of the abdomen and pelvis from 12/11/2018, chest x-ray from the previous day. FINDINGS: Cardiovascular: Somewhat limited due to lack of IV contrast. Aortic calcifications are noted.  Coronary calcifications are seen. Right-sided PICC line is noted in satisfactory position. Mediastinum/Nodes: Thoracic inlet is within normal limits. The esophagus as visualized is within normal limits. No sizable hilar or mediastinal adenopathy is noted. Lungs/Pleura: Lungs are well aerated bilaterally. The left lung shows only minimal scarring in the base. Right lung shows some lower lobe atelectasis which is increased slightly in the interval from the prior CT of 12/10/2020. Additionally, small right-sided pleural effusion is noted which is increased slightly in the interval from the prior exam. It has some thickened margins suggestive of empyema. The thickened margins suggest underlying empyema. A small focus of air is noted within which may be related to prior thoracentesis. No pneumothorax is seen. Upper Abdomen: Postsurgical changes are noted in the stomach. No other focal abnormality is noted in the upper abdomen. Previously seen fluid collection in the right liver posteriorly is not well appreciated on today's exam. Musculoskeletal: Degenerative changes of the thoracic spine are noted. No acute rib abnormality is seen. Postsurgical changes in the cervical spine are noted. No compression deformities are seen. IMPRESSION: Small fluid collection in the posterior aspect of the right pleural space with thickened margins most consistent with a small empyema. A small focus of air is noted within likely related to prior intervention. Slight increase in lower lobe atelectasis on the right when compared with the prior exam. No other acute abnormality is noted. Aortic Atherosclerosis (ICD10-I70.0). Electronically Signed   By: Inez Catalina M.D.   On: 12/16/2020 15:43   DG Chest Port 1 View  Result Date: 12/15/2020 CLINICAL DATA:  sob EXAM: PORTABLE CHEST 1 VIEW COMPARISON:  Radiograph 12/13/2020, CT 12/10/2020 FINDINGS: Unchanged cardiomediastinal silhouette. Unchanged right upper extremity PICC. Unchanged opacity  overlying the right mid lung corresponding to the known posterior loculated pleural collection. There is no new airspace disease. Bones are unchanged. IMPRESSION: Unchanged right midlung  opacity corresponding to known right pleural collection. No new airspace disease. Electronically Signed   By: Maurine Simmering   On: 12/15/2020 12:52   DG Knee Complete 4 Views Right  Result Date: 12/13/2020 CLINICAL DATA:  Fall with right knee swelling EXAM: RIGHT KNEE - COMPLETE 4+ VIEW COMPARISON:  October 19, 2020 FINDINGS: Partially visualized femoral intramedullary rod and screw fixation without evidence of hardware loosening or perihardware fracture within the visualized portions. No evidence of acute fracture or dislocation. Small joint effusion. Severe tricompartment degenerative change with chondrocalcinosis similar prior. Deformity of the proximal right fibula consistent with remote healed fracture. IMPRESSION: 1. No acute fracture or dislocation. 2. Small joint effusion. 3. Severe tricompartment degenerative change with chondrocalcinosis. Electronically Signed   By: Dahlia Bailiff MD   On: 12/13/2020 21:01      Subjective: Patient seen and examined at the bedside this afternoon.  Lying on the bed, very weak and lethargic but without any acute distress  Discharge Exam: Vitals:   01/08/21 1918 01/09/21 2048  BP: (!) 141/77 (!) 78/47  Pulse: (!) 114 (!) 114  Resp: (!) 23 18  Temp: 98.8 F (37.1 C) 99.2 F (37.3 C)  SpO2: 91% 96%   Vitals:   01/07/21 2204 01/08/21 1450 01/08/21 1918 01/09/21 2048  BP: (!) 142/71 126/67 (!) 141/77 (!) 78/47  Pulse: (!) 103 (!) 107 (!) 114 (!) 114  Resp: 16 16 (!) 23 18  Temp: (!) 97.4 F (36.3 C) 98.9 F (37.2 C) 98.8 F (37.1 C) 99.2 F (37.3 C)  TempSrc:  Oral Oral Oral  SpO2: 96% 93% 91% 96%  Weight:      Height:        General: Pt is not in acute distress,icteric Cardiovascular: RRR, S1/S2 +, no rubs, no gallops Respiratory: CTA bilaterally, no wheezing,  no rhonchi Abdominal: Soft, NT, ND, bowel sounds + Extremities: no edema, no cyanosis    The results of significant diagnostics from this hospitalization (including imaging, microbiology, ancillary and laboratory) are listed below for reference.     Microbiology: No results found for this or any previous visit (from the past 240 hour(s)).   Labs: BNP (last 3 results) No results for input(s): BNP in the last 8760 hours. Basic Metabolic Panel: No results for input(s): NA, K, CL, CO2, GLUCOSE, BUN, CREATININE, CALCIUM, MG, PHOS in the last 168 hours. Liver Function Tests: No results for input(s): AST, ALT, ALKPHOS, BILITOT, PROT, ALBUMIN in the last 168 hours. No results for input(s): LIPASE, AMYLASE in the last 168 hours. No results for input(s): AMMONIA in the last 168 hours. CBC: No results for input(s): WBC, NEUTROABS, HGB, HCT, MCV, PLT in the last 168 hours. Cardiac Enzymes: No results for input(s): CKTOTAL, CKMB, CKMBINDEX, TROPONINI in the last 168 hours. BNP: Invalid input(s): POCBNP CBG: No results for input(s): GLUCAP in the last 168 hours. D-Dimer No results for input(s): DDIMER in the last 72 hours. Hgb A1c No results for input(s): HGBA1C in the last 72 hours. Lipid Profile No results for input(s): CHOL, HDL, LDLCALC, TRIG, CHOLHDL, LDLDIRECT in the last 72 hours. Thyroid function studies No results for input(s): TSH, T4TOTAL, T3FREE, THYROIDAB in the last 72 hours.  Invalid input(s): FREET3 Anemia work up No results for input(s): VITAMINB12, FOLATE, FERRITIN, TIBC, IRON, RETICCTPCT in the last 72 hours. Urinalysis    Component Value Date/Time   COLORURINE AMBER (A) 12/31/2020 1432   APPEARANCEUR HAZY (A) 12/31/2020 1432   LABSPEC 1.027 12/31/2020 1432  PHURINE 5.0 12/31/2020 1432   GLUCOSEU 150 (A) 12/31/2020 1432   HGBUR MODERATE (A) 12/31/2020 1432   BILIRUBINUR SMALL (A) 12/31/2020 1432   BILIRUBINUR 1+ 02/23/2016 1256   KETONESUR NEGATIVE 12/31/2020  1432   PROTEINUR 100 (A) 12/31/2020 1432   UROBILINOGEN 1.0 02/23/2016 1256   NITRITE NEGATIVE 12/31/2020 1432   LEUKOCYTESUR MODERATE (A) 12/31/2020 1432   Sepsis Labs Invalid input(s): PROCALCITONIN,  WBC,  LACTICIDVEN Microbiology No results found for this or any previous visit (from the past 240 hour(s)).  Please note: You were cared for by a hospitalist during your hospital stay. Once you are discharged, your primary care physician will handle any further medical issues. Please note that NO REFILLS for any discharge medications will be authorized once you are discharged, as it is imperative that you return to your primary care physician (or establish a relationship with a primary care physician if you do not have one) for your post hospital discharge needs so that they can reassess your need for medications and monitor your lab values.    Time coordinating discharge: 40 minutes  SIGNED:   Shelly Coss, MD  [facility] 01/10/2021, 1:21 PM Pager [phone]  If 7PM-7AM, please contact night-coverage [email or web address] Password [email or web address]

## 2021-01-10 NOTE — Progress Notes (Signed)
Manufacturing engineer [facility]) Hospital Liaison note.   [facility] is unable to offer a room today. Hospital Liaison will follow up tomorrow or sooner if a room becomes available. Eligibility has been confirmed.   Please do not hesitate to call with questions.    Thank you,   Farrel Gordon, RN, [place] Hospital Liaison   [phone]

## 2021-01-10 NOTE — Plan of Care (Signed)
  Problem: Clinical Measurements: Goal: Respiratory complications will improve Outcome: Progressing   Problem: Nutrition: Goal: Adequate nutrition will be maintained Outcome: Progressing   Problem: Coping: Goal: Level of anxiety will decrease Outcome: Progressing   Problem: Elimination: Goal: Will not experience complications related to bowel motility Outcome: Progressing   Problem: Pain Managment: Goal: General experience of comfort will improve Outcome: Progressing   Problem: Safety: Goal: Ability to remain free from injury will improve Outcome: Progressing   Problem: Skin Integrity: Goal: Risk for impaired skin integrity will decrease Outcome: Progressing

## 2021-01-11 ENCOUNTER — Ambulatory Visit: Payer: Medicare Other

## 2021-01-11 NOTE — Progress Notes (Signed)
Patient seen and examined at the bedside this morning.  He remains lethargic, unresponsive this morning.  He is waiting for residential hospice bed.  There is no change in the medical management.  Discharge summary and orders are written.

## 2021-01-11 NOTE — TOC Transition Note (Signed)
Transition of Care [facility]) - CM/SW Discharge Note   Patient Details  Name: Colton Bush MRN: [id number] Date of Birth: [birth year]  Transition of Care [facility]) CM/SW Contact:  Illene Regulus, LCSW Phone Number: 01/11/2021, 3:09 PM   Clinical Narrative:    Patient is stable for discharge , going to [facility], family was contacted. PTAR arranged . RN please call report to [phone].   Final next level of care: [place] Barriers to Discharge: No Barriers Identified   Patient Goals and CMS Choice        Discharge Placement                       Discharge Plan and Services                                     Social Determinants of Health (SDOH) Interventions     Readmission Risk Interventions No flowsheet data found.

## 2021-01-11 NOTE — Plan of Care (Signed)

## 2021-01-11 NOTE — Progress Notes (Signed)
[facility] ([facility]) Hospital Liaison note.     This patient is approved to transfer to [facility] today.    [facility] will notify TOC when registration paperwork has been completed to arrange transport.    RN please call report to [phone].   Thank you,     Mary Anne Robertson, RN, CCM       [facility] Hospital Liaison  [phone] 

## 2021-01-11 NOTE — Progress Notes (Signed)
[facility])  All necessary consents are completed and transport can be arranged to [employer] [facility].  RN staff, please call report to [phone].   Thank you, Venia Carbon RN, BSN, [place] Hospital Liaison

## 2021-01-11 NOTE — Progress Notes (Signed)
Report called to [facility] and given to Colton

## 2021-01-11 NOTE — Care Management Important Message (Signed)
Important Message  Patient Details IM Letter given to the Patient. Name: Colton Bush MRN: [id number] Date of Birth: 17-Jan-1956   Medicare Important Message Given:  Yes     Kerin Salen 01/11/2021, 10:16 AM

## 2021-01-12 ENCOUNTER — Telehealth: Payer: Self-pay | Admitting: *Deleted

## 2021-01-13 ENCOUNTER — Ambulatory Visit: Payer: Medicare Other

## 2021-01-17 ENCOUNTER — Encounter: Payer: Medicare Other | Admitting: Physical Medicine and Rehabilitation

## 2021-01-17 ENCOUNTER — Ambulatory Visit: Payer: Medicare Other

## 2021-01-18 ENCOUNTER — Ambulatory Visit: Payer: Medicare Other

## 2021-01-18 LAB — ACID FAST CULTURE WITH REFLEXED SENSITIVITIES (MYCOBACTERIA): Acid Fast Culture: NEGATIVE

## 2021-01-19 ENCOUNTER — Ambulatory Visit: Payer: Medicare Other

## 2021-01-20 ENCOUNTER — Ambulatory Visit: Payer: Medicare Other

## 2021-01-21 ENCOUNTER — Ambulatory Visit: Payer: Medicare Other

## 2021-01-24 ENCOUNTER — Ambulatory Visit: Payer: Medicare Other

## 2021-01-25 ENCOUNTER — Ambulatory Visit: Payer: Medicare Other

## 2021-01-26 ENCOUNTER — Ambulatory Visit: Payer: Medicare Other

## 2021-01-27 ENCOUNTER — Ambulatory Visit: Payer: Medicare Other

## 2021-01-28 ENCOUNTER — Ambulatory Visit: Payer: Medicare Other

## 2021-01-28 NOTE — Telephone Encounter (Signed)
Wife called to report that [name] has died and to cancel his appt with Dr Dagoberto Ligas 01/17/21. Dr Dagoberto Ligas notified.

## 2021-01-28 DEATH — deceased

## 2021-08-19 IMAGING — DX DG CHEST 1V PORT
1 series · 2 of 2 positions shown · non-contrast
Comparison: Radiograph 12/13/2020, CT 12/10/2020

CLINICAL DATA: sob

EXAM:
PORTABLE CHEST 1 VIEW

[Series 1: chest · 0.14mm/px · 2 of 2 slices shown]
[im 1/2]
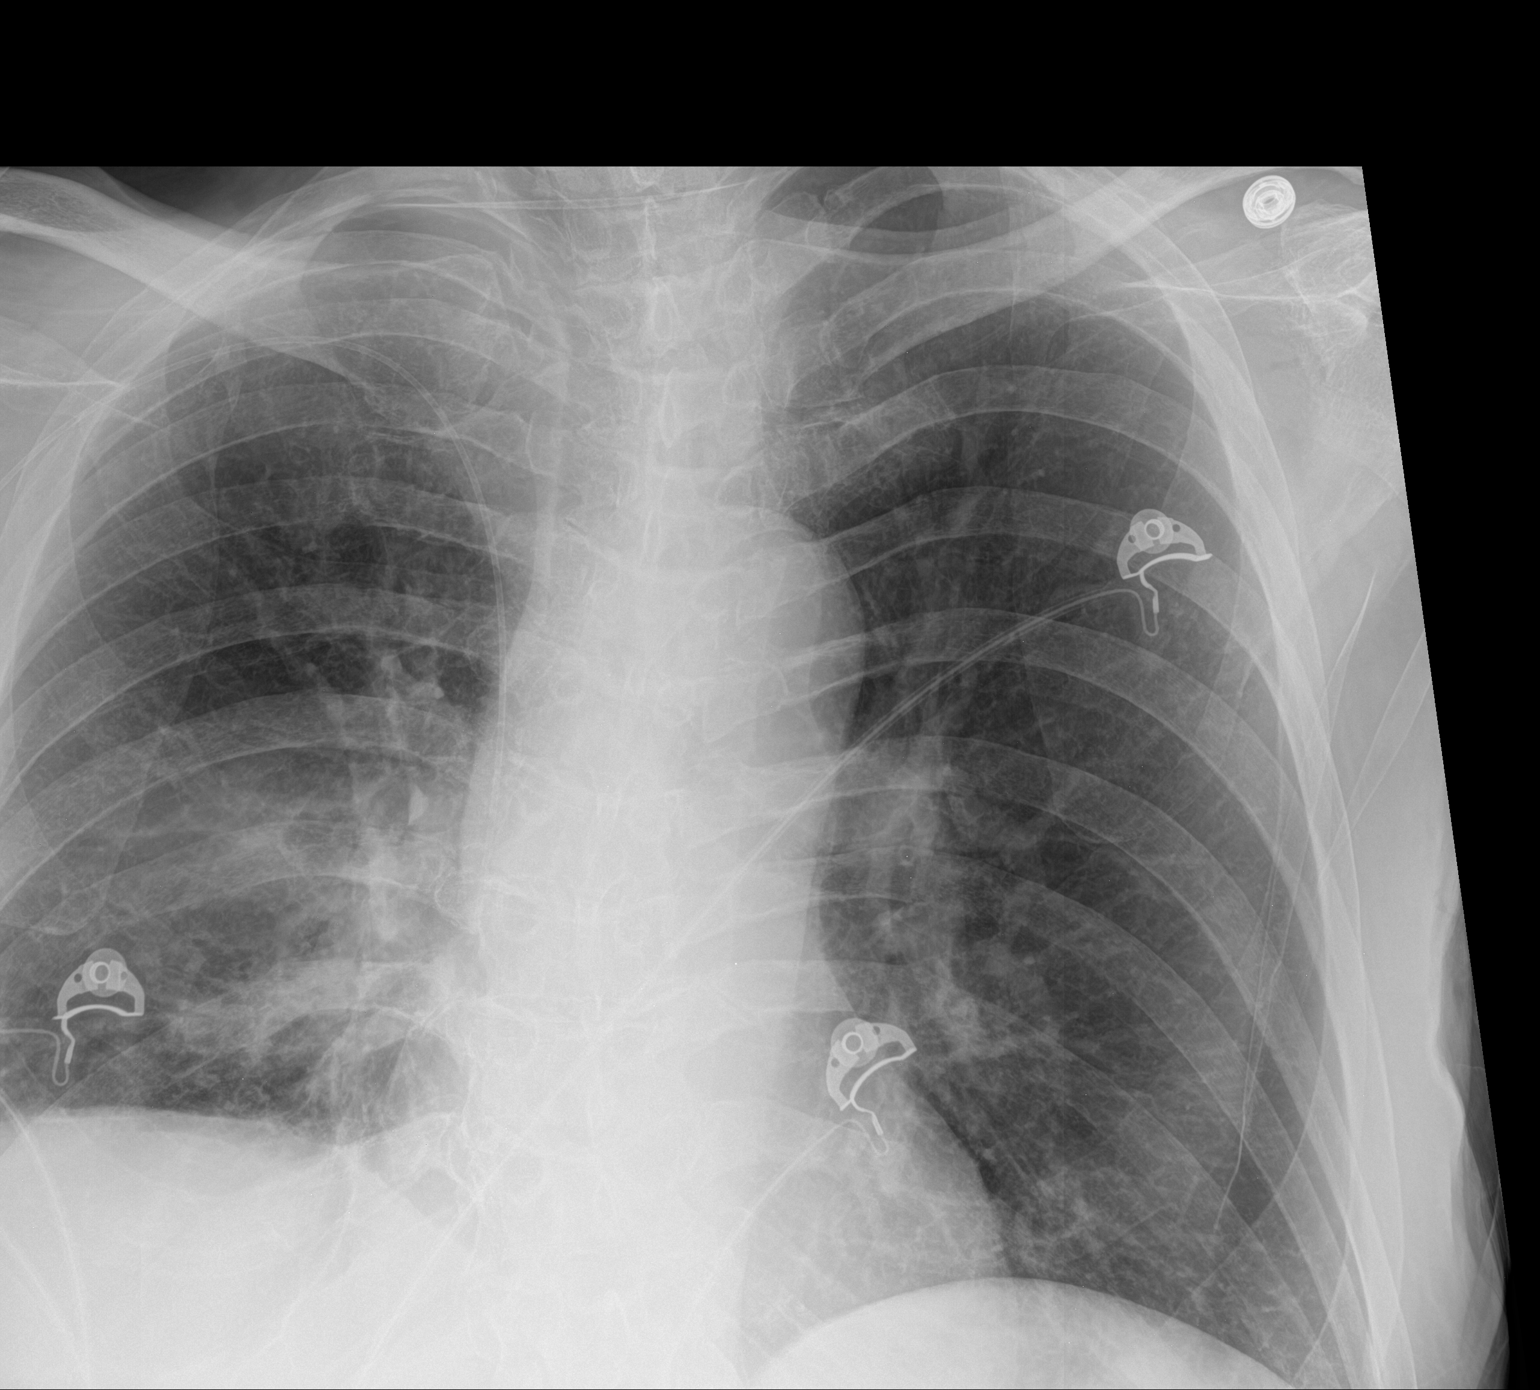
[im 2/2]
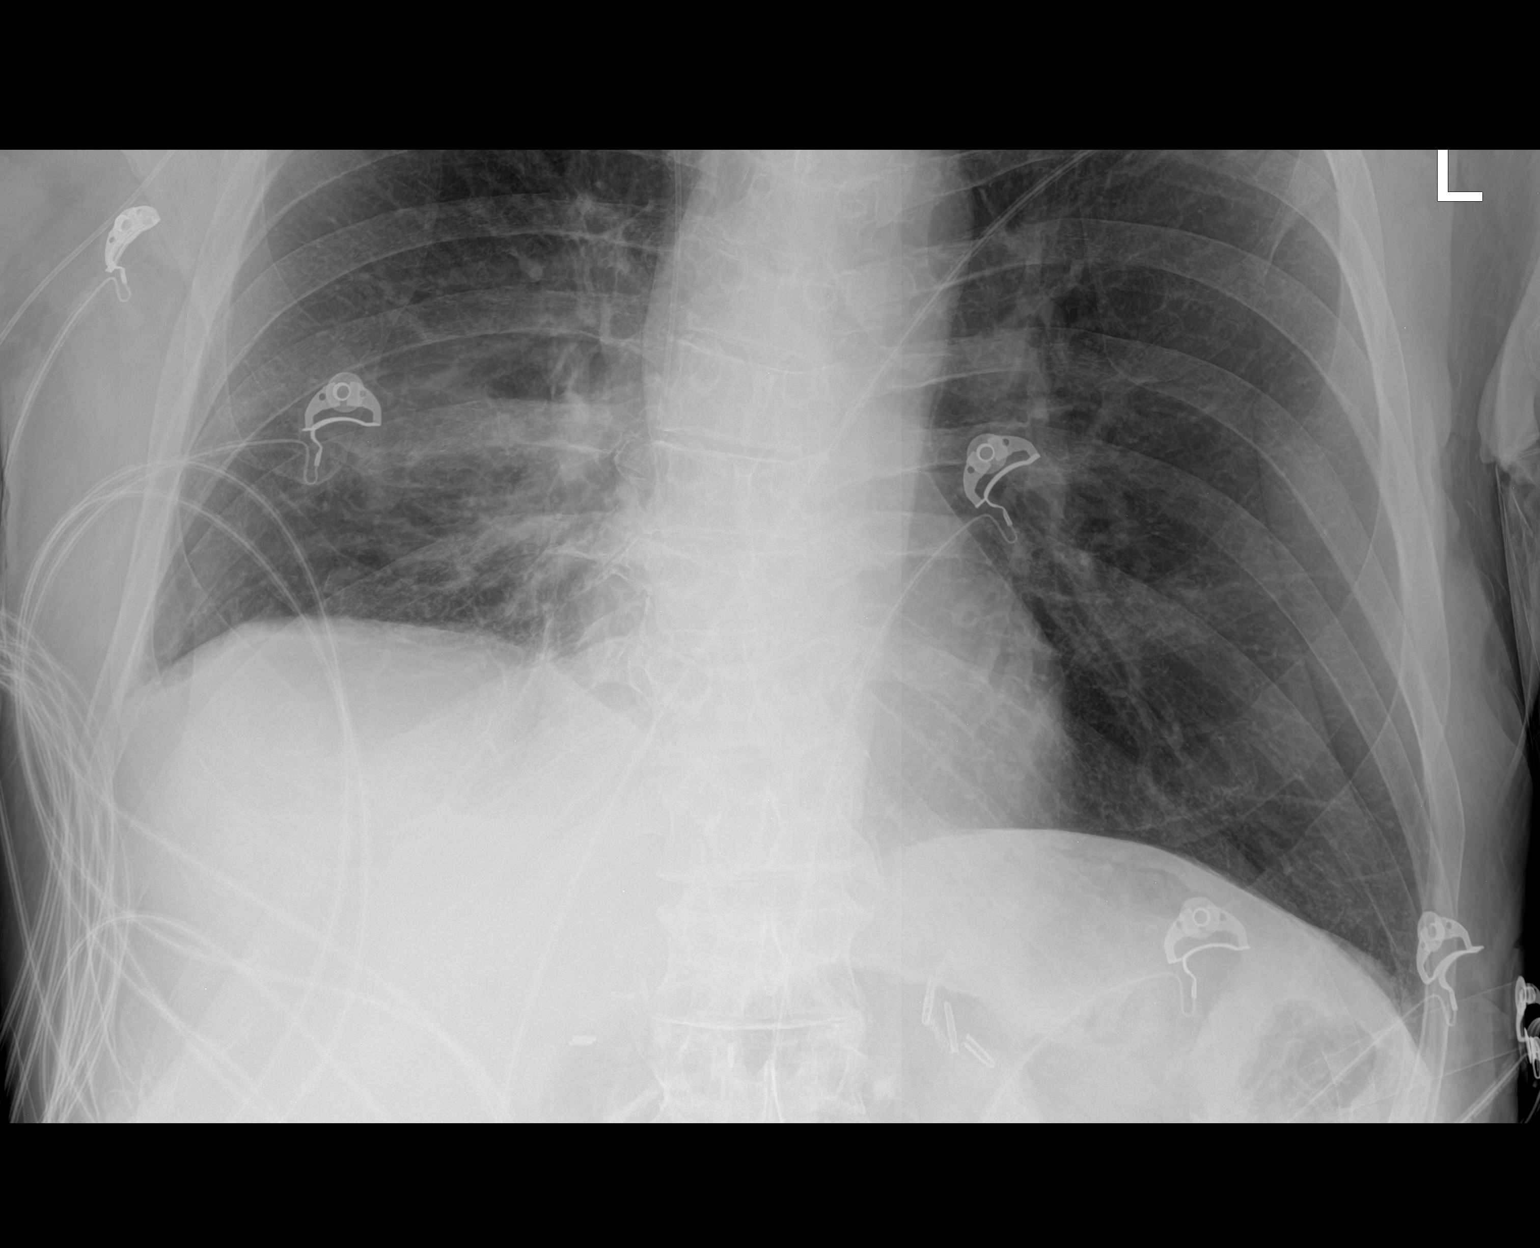

[2 of 2 positions shown; findings below may reference images not displayed]

FINDINGS: Unchanged cardiomediastinal silhouette. Unchanged right upper
extremity PICC. Unchanged opacity overlying the right mid lung
corresponding to the known posterior loculated pleural collection.
There is no new airspace disease. Bones are unchanged.
IMPRESSION: Unchanged right midlung opacity corresponding to known right pleural
collection. No new airspace disease.

## 2021-08-20 IMAGING — CT CT CHEST W/O CM
2 of 4 series · 15 of 36 positions shown, 18 images · non-contrast
Comparison: CT of the abdomen and pelvis from 12/11/2018, chest
x-ray from the previous day.

CLINICAL DATA: Evaluate empyema

EXAM:
CT CHEST WITHOUT CONTRAST
TECHNIQUE: Multidetector CT imaging of the chest was performed following the
standard protocol without IV contrast.

[Series 4: thorax 2.0 · axial · 0.86mm/px · z∈[+1184,+1512]mm · 12 of 184 slices shown, 15 images]
[im 10/184  mediastinal]
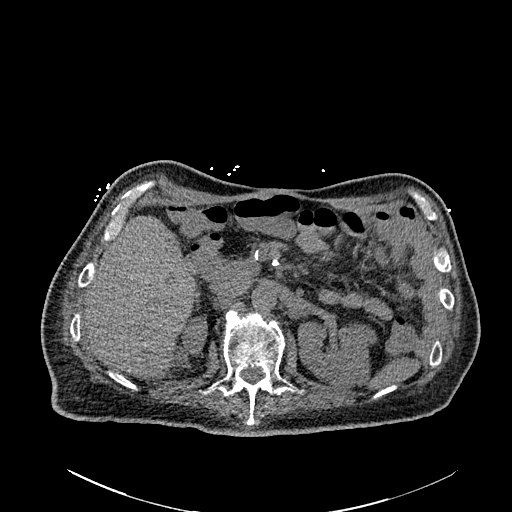
[im 10/184  lung]
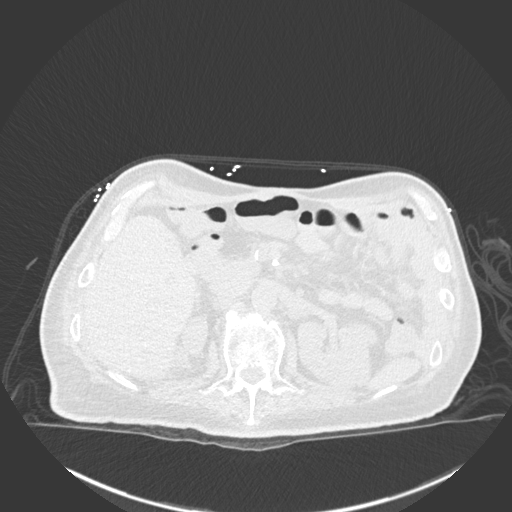
[im 29/184  lung]
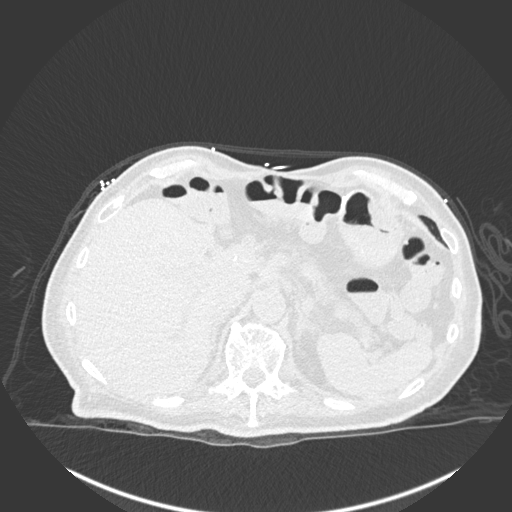
[im 39/184  lung]
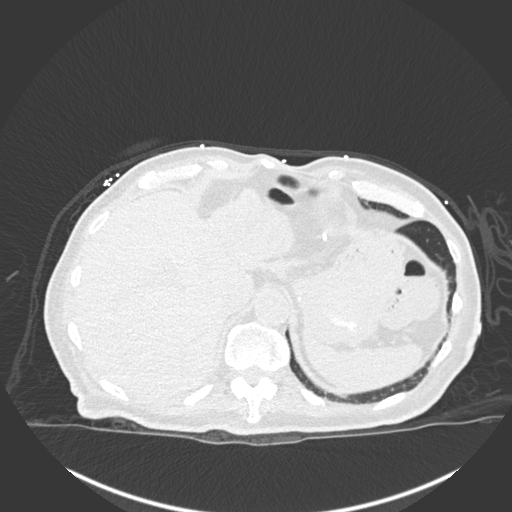
[im 58/184  lung]
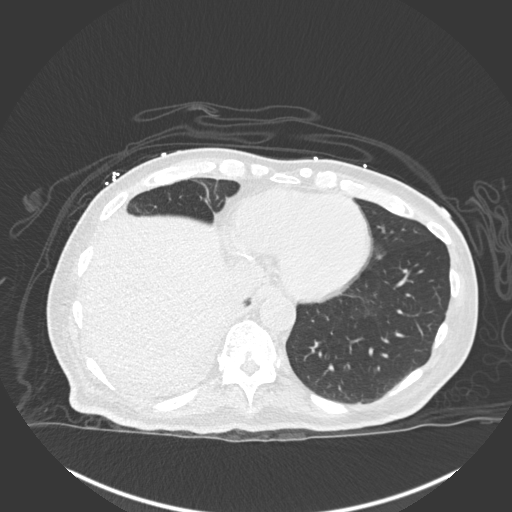
[im 68/184  mediastinal]
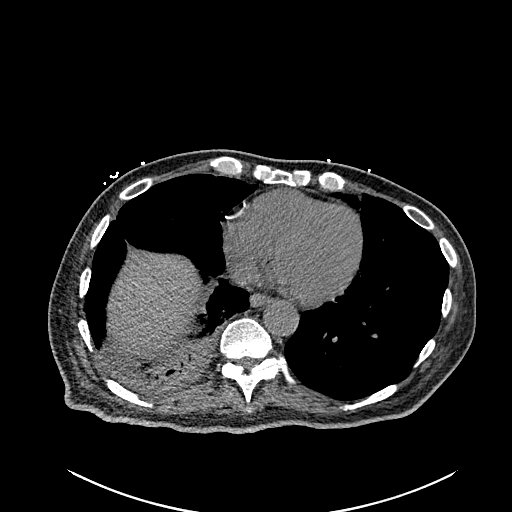
[im 68/184  lung]
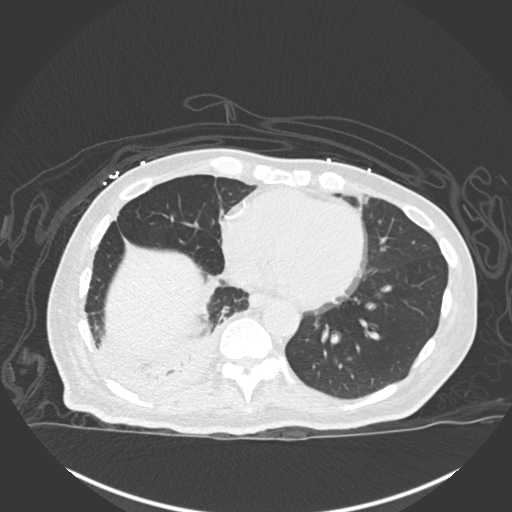
[im 87/184  lung]
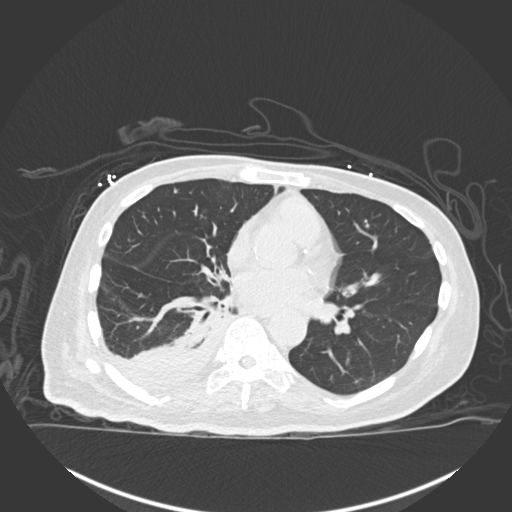
[im 97/184  lung]
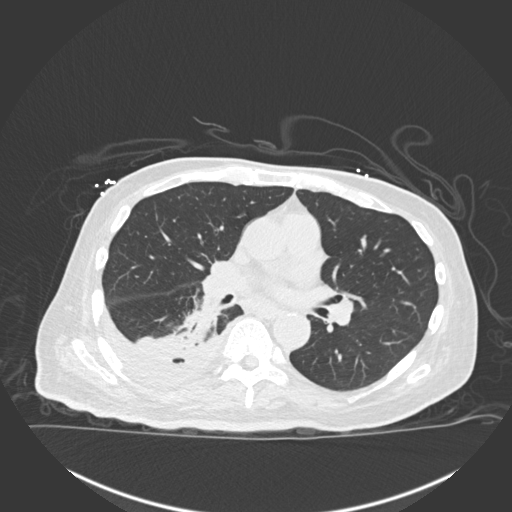
[im 116/184  lung]
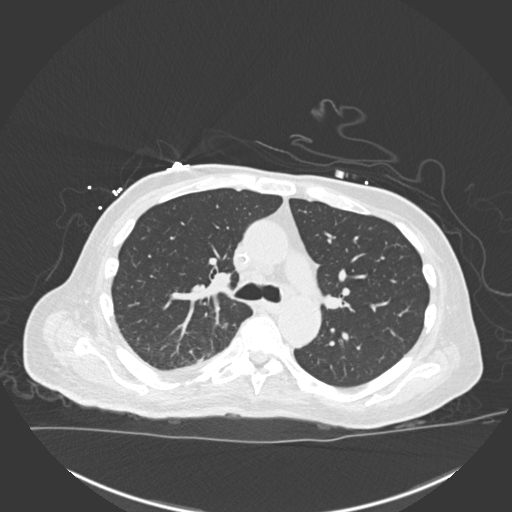
[im 126/184  mediastinal]
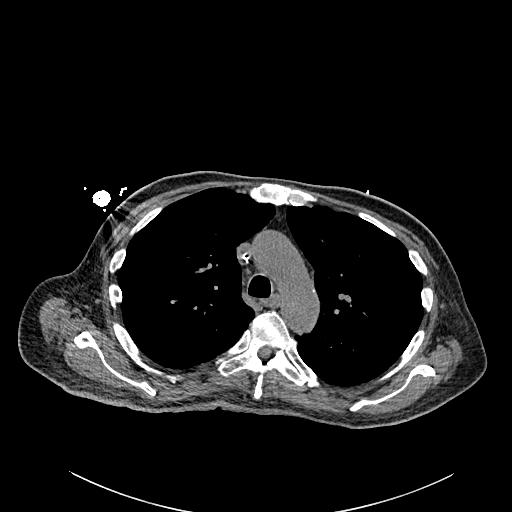
[im 126/184  lung]
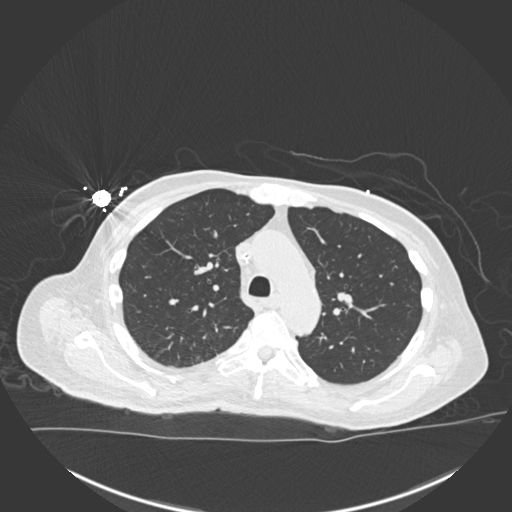
[im 145/184  lung]
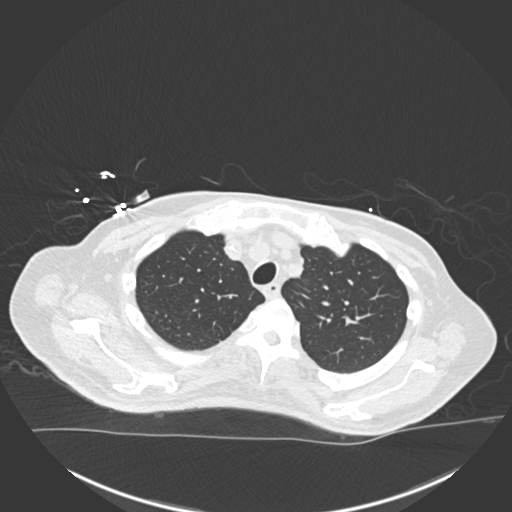
[im 155/184  lung]
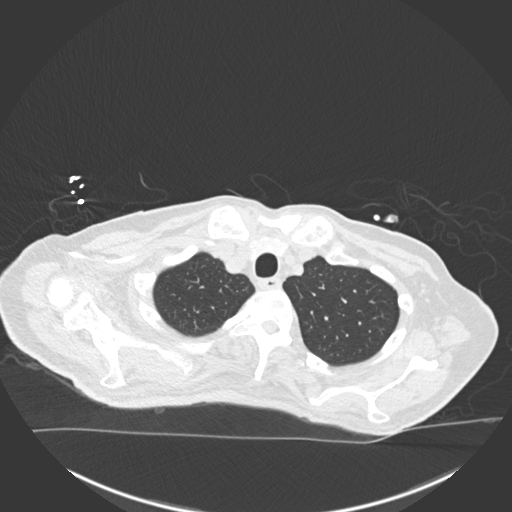
[im 174/184  lung]
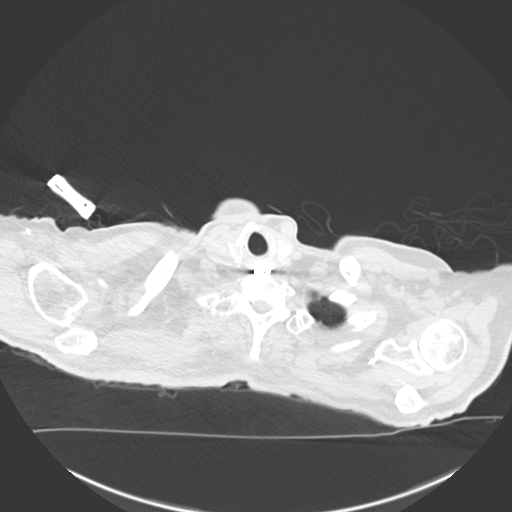

[Series 7: coronal · coronal · 0.72mm/px · 3 of 96 slices shown]
[im 20/96  lung]
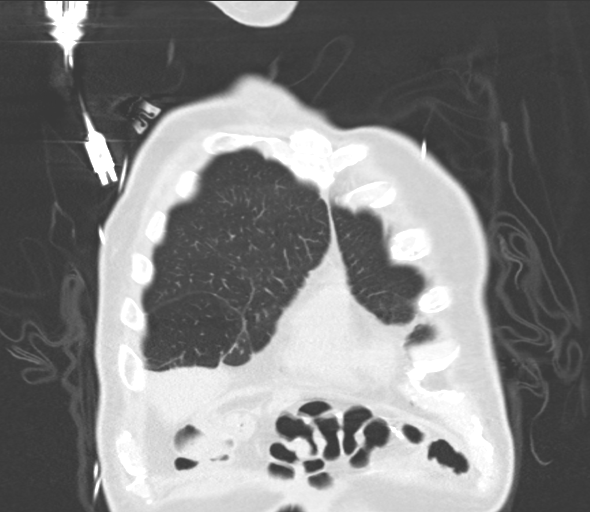
[im 39/96  lung]
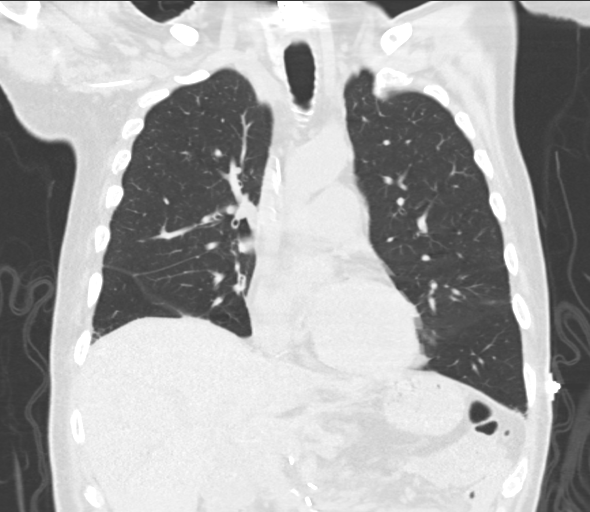
[im 58/96  lung]
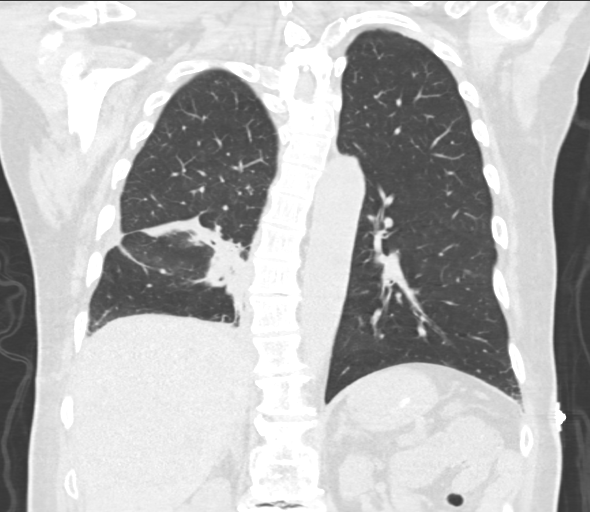

[15 of 36 positions shown; findings below may reference images not displayed]

FINDINGS: Cardiovascular: Somewhat limited due to lack of IV contrast. Aortic
calcifications are noted. Coronary calcifications are seen.
Right-sided PICC line is noted in satisfactory position.

Mediastinum/Nodes: Thoracic inlet is within normal limits. The
esophagus as visualized is within normal limits. No sizable hilar or
mediastinal adenopathy is noted.

Lungs/Pleura: Lungs are well aerated bilaterally. The left lung
shows only minimal scarring in the base. Right lung shows some lower
lobe atelectasis which is increased slightly in the interval from
the prior CT of 12/10/2020. Additionally, small right-sided pleural
effusion is noted which is increased slightly in the interval from
the prior exam. It has some thickened margins suggestive of
empyema. The thickened margins suggest underlying empyema. A small
focus of air is noted within which may be related to prior
thoracentesis. No pneumothorax is seen.

Upper Abdomen: Postsurgical changes are noted in the stomach. No
other focal abnormality is noted in the upper abdomen. Previously
seen fluid collection in the right liver posteriorly is not well
appreciated on today's exam.

Musculoskeletal: Degenerative changes of the thoracic spine are
noted. No acute rib abnormality is seen. Postsurgical changes in the
cervical spine are noted. No compression deformities are seen.
IMPRESSION: Small fluid collection in the posterior aspect of the right pleural
space with thickened margins most consistent with a small empyema. A
small focus of air is noted within likely related to prior
intervention.

Slight increase in lower lobe atelectasis on the right when compared
with the prior exam.

No other acute abnormality is noted.

Aortic Atherosclerosis ([id number]).

## 2021-10-03 ENCOUNTER — Ambulatory Visit: Payer: Medicare Other
